# Patient Record
Sex: Female | Born: 1960 | Race: Black or African American | Hispanic: No | Smoking: Former smoker
Health system: Southern US, Community
[De-identification: ages and names within clinical notes are randomized; demographics above are authoritative.]

## PROBLEM LIST (undated history)

## (undated) DIAGNOSIS — J449 Chronic obstructive pulmonary disease, unspecified: Secondary | ICD-10-CM

## (undated) DIAGNOSIS — N200 Calculus of kidney: Secondary | ICD-10-CM

## (undated) DIAGNOSIS — E785 Hyperlipidemia, unspecified: Secondary | ICD-10-CM

## (undated) DIAGNOSIS — R7303 Prediabetes: Secondary | ICD-10-CM

## (undated) DIAGNOSIS — M810 Age-related osteoporosis without current pathological fracture: Secondary | ICD-10-CM

## (undated) DIAGNOSIS — J45909 Unspecified asthma, uncomplicated: Secondary | ICD-10-CM

## (undated) DIAGNOSIS — F319 Bipolar disorder, unspecified: Secondary | ICD-10-CM

## (undated) DIAGNOSIS — T7840XA Allergy, unspecified, initial encounter: Secondary | ICD-10-CM

## (undated) DIAGNOSIS — J309 Allergic rhinitis, unspecified: Secondary | ICD-10-CM

## (undated) DIAGNOSIS — I739 Peripheral vascular disease, unspecified: Secondary | ICD-10-CM

## (undated) DIAGNOSIS — F32A Depression, unspecified: Secondary | ICD-10-CM

## (undated) DIAGNOSIS — M1990 Unspecified osteoarthritis, unspecified site: Secondary | ICD-10-CM

## (undated) DIAGNOSIS — L509 Urticaria, unspecified: Secondary | ICD-10-CM

## (undated) DIAGNOSIS — E559 Vitamin D deficiency, unspecified: Secondary | ICD-10-CM

## (undated) DIAGNOSIS — Z87442 Personal history of urinary calculi: Secondary | ICD-10-CM

## (undated) DIAGNOSIS — I209 Angina pectoris, unspecified: Secondary | ICD-10-CM

## (undated) DIAGNOSIS — R569 Unspecified convulsions: Secondary | ICD-10-CM

## (undated) DIAGNOSIS — J439 Emphysema, unspecified: Secondary | ICD-10-CM

## (undated) DIAGNOSIS — F329 Major depressive disorder, single episode, unspecified: Secondary | ICD-10-CM

## (undated) DIAGNOSIS — K219 Gastro-esophageal reflux disease without esophagitis: Secondary | ICD-10-CM

## (undated) DIAGNOSIS — J189 Pneumonia, unspecified organism: Secondary | ICD-10-CM

## (undated) DIAGNOSIS — F419 Anxiety disorder, unspecified: Secondary | ICD-10-CM

## (undated) DIAGNOSIS — R0600 Dyspnea, unspecified: Secondary | ICD-10-CM

## (undated) DIAGNOSIS — I10 Essential (primary) hypertension: Secondary | ICD-10-CM

## (undated) DIAGNOSIS — G4730 Sleep apnea, unspecified: Secondary | ICD-10-CM

## (undated) DIAGNOSIS — D509 Iron deficiency anemia, unspecified: Secondary | ICD-10-CM

## (undated) DIAGNOSIS — M797 Fibromyalgia: Secondary | ICD-10-CM

## (undated) DIAGNOSIS — R519 Headache, unspecified: Secondary | ICD-10-CM

## (undated) HISTORY — DX: Allergy, unspecified, initial encounter: T78.40XA

## (undated) HISTORY — DX: Age-related osteoporosis without current pathological fracture: M81.0

## (undated) HISTORY — DX: Chronic obstructive pulmonary disease, unspecified: J44.9

## (undated) HISTORY — PX: NECK SURGERY: SHX720

## (undated) HISTORY — DX: Prediabetes: R73.03

## (undated) HISTORY — DX: Unspecified asthma, uncomplicated: J45.909

## (undated) HISTORY — DX: Emphysema, unspecified: J43.9

## (undated) HISTORY — PX: DIAGNOSTIC LAPAROSCOPY: SUR761

## (undated) HISTORY — PX: KIDNEY STONE SURGERY: SHX686

## (undated) HISTORY — PX: INCISE AND DRAIN ABCESS: PRO64

## (undated) HISTORY — DX: Vitamin D deficiency, unspecified: E55.9

## (undated) HISTORY — DX: Calculus of kidney: N20.0

## (undated) HISTORY — PX: TOE SURGERY: SHX1073

## (undated) HISTORY — DX: Urticaria, unspecified: L50.9

## (undated) HISTORY — DX: Fibromyalgia: M79.7

## (undated) HISTORY — DX: Hyperlipidemia, unspecified: E78.5

## (undated) HISTORY — PX: ABDOMINAL HYSTERECTOMY: SHX81

## (undated) HISTORY — DX: Essential (primary) hypertension: I10

## (undated) HISTORY — PX: TUBAL LIGATION: SHX77

## (undated) HISTORY — PX: BACK SURGERY: SHX140

## (undated) HISTORY — DX: Allergic rhinitis, unspecified: J30.9

## (undated) HISTORY — DX: Iron deficiency anemia, unspecified: D50.9

## (undated) HISTORY — DX: Sleep apnea, unspecified: G47.30

## (undated) HISTORY — PX: HEMORRHOID SURGERY: SHX153

## (undated) SURGERY — Surgical Case
Anesthesia: *Unknown

---

## 1998-11-26 ENCOUNTER — Emergency Department (HOSPITAL_COMMUNITY): Admission: EM | Admit: 1998-11-26 | Discharge: 1998-11-26 | Payer: Self-pay | Admitting: Emergency Medicine

## 1999-04-15 ENCOUNTER — Emergency Department (HOSPITAL_COMMUNITY): Admission: EM | Admit: 1999-04-15 | Discharge: 1999-04-15 | Payer: Self-pay | Admitting: Emergency Medicine

## 2000-01-17 ENCOUNTER — Encounter: Admission: RE | Admit: 2000-01-17 | Discharge: 2000-01-17 | Payer: Self-pay | Admitting: Internal Medicine

## 2000-01-17 ENCOUNTER — Encounter: Payer: Self-pay | Admitting: Internal Medicine

## 2000-04-10 ENCOUNTER — Other Ambulatory Visit: Admission: RE | Admit: 2000-04-10 | Discharge: 2000-04-10 | Payer: Self-pay | Admitting: Obstetrics

## 2000-04-26 ENCOUNTER — Encounter: Payer: Self-pay | Admitting: Internal Medicine

## 2000-04-26 ENCOUNTER — Emergency Department (HOSPITAL_COMMUNITY): Admission: EM | Admit: 2000-04-26 | Discharge: 2000-04-26 | Payer: Self-pay | Admitting: Internal Medicine

## 2000-10-12 ENCOUNTER — Emergency Department (HOSPITAL_COMMUNITY): Admission: EM | Admit: 2000-10-12 | Discharge: 2000-10-12 | Payer: Self-pay | Admitting: Emergency Medicine

## 2000-10-12 ENCOUNTER — Encounter: Payer: Self-pay | Admitting: Emergency Medicine

## 2001-03-22 ENCOUNTER — Emergency Department (HOSPITAL_COMMUNITY): Admission: EM | Admit: 2001-03-22 | Discharge: 2001-03-22 | Payer: Self-pay | Admitting: Emergency Medicine

## 2002-05-21 ENCOUNTER — Other Ambulatory Visit: Admission: RE | Admit: 2002-05-21 | Discharge: 2002-05-21 | Payer: Self-pay | Admitting: *Deleted

## 2003-01-20 ENCOUNTER — Ambulatory Visit (HOSPITAL_COMMUNITY): Admission: RE | Admit: 2003-01-20 | Discharge: 2003-01-20 | Payer: Self-pay | Admitting: Family Medicine

## 2003-01-20 ENCOUNTER — Encounter: Payer: Self-pay | Admitting: Family Medicine

## 2003-03-20 ENCOUNTER — Emergency Department (HOSPITAL_COMMUNITY): Admission: EM | Admit: 2003-03-20 | Discharge: 2003-03-20 | Payer: Self-pay | Admitting: Emergency Medicine

## 2003-03-22 ENCOUNTER — Encounter: Payer: Self-pay | Admitting: Emergency Medicine

## 2003-03-22 ENCOUNTER — Ambulatory Visit (HOSPITAL_COMMUNITY): Admission: RE | Admit: 2003-03-22 | Discharge: 2003-03-22 | Payer: Self-pay | Admitting: Emergency Medicine

## 2003-05-28 ENCOUNTER — Encounter (INDEPENDENT_AMBULATORY_CARE_PROVIDER_SITE_OTHER): Payer: Self-pay | Admitting: *Deleted

## 2003-05-28 ENCOUNTER — Inpatient Hospital Stay (HOSPITAL_COMMUNITY): Admission: RE | Admit: 2003-05-28 | Discharge: 2003-05-31 | Payer: Self-pay | Admitting: *Deleted

## 2004-11-13 ENCOUNTER — Ambulatory Visit (HOSPITAL_COMMUNITY): Admission: RE | Admit: 2004-11-13 | Discharge: 2004-11-13 | Payer: Self-pay | Admitting: Internal Medicine

## 2006-04-01 ENCOUNTER — Emergency Department (HOSPITAL_COMMUNITY): Admission: EM | Admit: 2006-04-01 | Discharge: 2006-04-01 | Payer: Self-pay | Admitting: Emergency Medicine

## 2006-04-18 ENCOUNTER — Emergency Department (HOSPITAL_COMMUNITY): Admission: EM | Admit: 2006-04-18 | Discharge: 2006-04-18 | Payer: Self-pay | Admitting: Emergency Medicine

## 2006-08-26 ENCOUNTER — Emergency Department (HOSPITAL_COMMUNITY): Admission: EM | Admit: 2006-08-26 | Discharge: 2006-08-26 | Payer: Self-pay | Admitting: Emergency Medicine

## 2006-08-28 ENCOUNTER — Ambulatory Visit (HOSPITAL_COMMUNITY): Admission: RE | Admit: 2006-08-28 | Discharge: 2006-08-28 | Payer: Self-pay | Admitting: Internal Medicine

## 2006-09-09 ENCOUNTER — Ambulatory Visit (HOSPITAL_COMMUNITY): Admission: RE | Admit: 2006-09-09 | Discharge: 2006-09-10 | Payer: Self-pay | Admitting: Neurosurgery

## 2007-09-25 ENCOUNTER — Ambulatory Visit (HOSPITAL_COMMUNITY): Admission: RE | Admit: 2007-09-25 | Discharge: 2007-09-25 | Payer: Self-pay | Admitting: Internal Medicine

## 2008-08-04 ENCOUNTER — Emergency Department (HOSPITAL_COMMUNITY): Admission: EM | Admit: 2008-08-04 | Discharge: 2008-08-04 | Payer: Self-pay | Admitting: Emergency Medicine

## 2009-03-02 ENCOUNTER — Emergency Department (HOSPITAL_COMMUNITY): Admission: EM | Admit: 2009-03-02 | Discharge: 2009-03-02 | Payer: Self-pay | Admitting: Emergency Medicine

## 2009-04-28 ENCOUNTER — Observation Stay (HOSPITAL_COMMUNITY): Admission: EM | Admit: 2009-04-28 | Discharge: 2009-04-29 | Payer: Self-pay | Admitting: Emergency Medicine

## 2009-05-02 ENCOUNTER — Inpatient Hospital Stay (HOSPITAL_COMMUNITY): Admission: EM | Admit: 2009-05-02 | Discharge: 2009-05-08 | Payer: Self-pay | Admitting: Emergency Medicine

## 2009-05-12 ENCOUNTER — Ambulatory Visit: Payer: Self-pay | Admitting: Emergency Medicine

## 2009-05-12 DIAGNOSIS — J449 Chronic obstructive pulmonary disease, unspecified: Secondary | ICD-10-CM | POA: Insufficient documentation

## 2009-05-12 DIAGNOSIS — I10 Essential (primary) hypertension: Secondary | ICD-10-CM

## 2009-05-12 DIAGNOSIS — J45909 Unspecified asthma, uncomplicated: Secondary | ICD-10-CM | POA: Insufficient documentation

## 2009-05-12 DIAGNOSIS — J309 Allergic rhinitis, unspecified: Secondary | ICD-10-CM

## 2009-05-12 HISTORY — DX: Unspecified asthma, uncomplicated: J45.909

## 2009-05-12 HISTORY — DX: Allergic rhinitis, unspecified: J30.9

## 2009-05-12 HISTORY — DX: Essential (primary) hypertension: I10

## 2009-05-19 ENCOUNTER — Emergency Department (HOSPITAL_COMMUNITY): Admission: EM | Admit: 2009-05-19 | Discharge: 2009-05-19 | Payer: Self-pay | Admitting: Emergency Medicine

## 2009-06-15 ENCOUNTER — Ambulatory Visit: Payer: Self-pay | Admitting: Emergency Medicine

## 2009-07-05 ENCOUNTER — Emergency Department (HOSPITAL_COMMUNITY): Admission: EM | Admit: 2009-07-05 | Discharge: 2009-07-05 | Payer: Self-pay | Admitting: Emergency Medicine

## 2009-07-06 ENCOUNTER — Telehealth (INDEPENDENT_AMBULATORY_CARE_PROVIDER_SITE_OTHER): Payer: Self-pay | Admitting: *Deleted

## 2009-07-25 ENCOUNTER — Telehealth (INDEPENDENT_AMBULATORY_CARE_PROVIDER_SITE_OTHER): Payer: Self-pay | Admitting: *Deleted

## 2009-07-26 ENCOUNTER — Ambulatory Visit: Payer: Self-pay | Admitting: Emergency Medicine

## 2009-08-24 DIAGNOSIS — J441 Chronic obstructive pulmonary disease with (acute) exacerbation: Secondary | ICD-10-CM | POA: Insufficient documentation

## 2009-08-24 DIAGNOSIS — R519 Headache, unspecified: Secondary | ICD-10-CM | POA: Insufficient documentation

## 2009-08-24 DIAGNOSIS — R51 Headache: Secondary | ICD-10-CM | POA: Insufficient documentation

## 2009-08-24 DIAGNOSIS — J449 Chronic obstructive pulmonary disease, unspecified: Secondary | ICD-10-CM

## 2009-08-24 HISTORY — DX: Chronic obstructive pulmonary disease, unspecified: J44.9

## 2009-08-25 ENCOUNTER — Ambulatory Visit: Payer: Self-pay | Admitting: Cardiology

## 2009-08-30 ENCOUNTER — Telehealth (INDEPENDENT_AMBULATORY_CARE_PROVIDER_SITE_OTHER): Payer: Self-pay | Admitting: *Deleted

## 2009-08-31 ENCOUNTER — Ambulatory Visit: Payer: Self-pay

## 2009-08-31 ENCOUNTER — Encounter: Payer: Self-pay | Admitting: Cardiology

## 2009-12-14 ENCOUNTER — Emergency Department (HOSPITAL_COMMUNITY): Admission: EM | Admit: 2009-12-14 | Discharge: 2009-12-14 | Payer: Self-pay | Admitting: Emergency Medicine

## 2009-12-23 ENCOUNTER — Emergency Department (HOSPITAL_COMMUNITY): Admission: EM | Admit: 2009-12-23 | Discharge: 2009-12-23 | Payer: Self-pay | Admitting: Family Medicine

## 2010-05-10 ENCOUNTER — Ambulatory Visit (HOSPITAL_COMMUNITY): Admission: RE | Admit: 2010-05-10 | Discharge: 2010-05-10 | Payer: Self-pay | Admitting: Psychiatry

## 2010-05-14 ENCOUNTER — Emergency Department (HOSPITAL_COMMUNITY): Admission: EM | Admit: 2010-05-14 | Discharge: 2010-05-14 | Payer: Self-pay | Admitting: Emergency Medicine

## 2010-07-12 ENCOUNTER — Ambulatory Visit: Payer: Self-pay | Admitting: Internal Medicine

## 2010-07-12 ENCOUNTER — Encounter (INDEPENDENT_AMBULATORY_CARE_PROVIDER_SITE_OTHER): Payer: Self-pay | Admitting: Family Medicine

## 2010-07-12 LAB — CONVERTED CEMR LAB
ALT: 14 units/L (ref 0–35)
AST: 15 units/L (ref 0–37)
Albumin: 4.4 g/dL (ref 3.5–5.2)
Alkaline Phosphatase: 71 units/L (ref 39–117)
BUN: 9 mg/dL (ref 6–23)
Basophils Absolute: 0 10*3/uL (ref 0.0–0.1)
Basophils Relative: 0 % (ref 0–1)
CO2: 26 meq/L (ref 19–32)
Calcium: 9.4 mg/dL (ref 8.4–10.5)
Chloride: 110 meq/L (ref 96–112)
Cholesterol: 155 mg/dL (ref 0–200)
Creatinine, Ser: 0.76 mg/dL (ref 0.40–1.20)
Eosinophils Absolute: 0.2 10*3/uL (ref 0.0–0.7)
Eosinophils Relative: 2 % (ref 0–5)
Glucose, Bld: 65 mg/dL — ABNORMAL LOW (ref 70–99)
HCT: 36 % (ref 36.0–46.0)
HDL: 53 mg/dL (ref >39)
Hemoglobin: 12.1 g/dL (ref 12.0–15.0)
LDL Cholesterol: 88 mg/dL (ref 0–99)
Lymphocytes Relative: 40 % (ref 12–46)
Lymphs Abs: 4.1 10*3/uL — ABNORMAL HIGH (ref 0.7–4.0)
MCHC: 33.6 g/dL (ref 30.0–36.0)
MCV: 91.4 fL (ref 78.0–100.0)
Monocytes Absolute: 0.7 10*3/uL (ref 0.1–1.0)
Monocytes Relative: 7 % (ref 3–12)
Neutro Abs: 5.2 10*3/uL (ref 1.7–7.7)
Neutrophils Relative %: 51 % (ref 43–77)
Platelets: 301 10*3/uL (ref 150–400)
Potassium: 4.1 meq/L (ref 3.5–5.3)
RBC: 3.94 M/uL (ref 3.87–5.11)
RDW: 13.1 % (ref 11.5–15.5)
Sodium: 145 meq/L (ref 135–145)
TSH: 1.376 microintl units/mL (ref 0.350–4.500)
Total Bilirubin: 0.3 mg/dL (ref 0.3–1.2)
Total CHOL/HDL Ratio: 2.9
Total Protein: 6.8 g/dL (ref 6.0–8.3)
Triglycerides: 68 mg/dL (ref <150)
Uric Acid, Serum: 6.1 mg/dL (ref 2.4–7.0)
VLDL: 14 mg/dL (ref 0–40)
WBC: 10.2 10*3/uL (ref 4.0–10.5)

## 2010-07-20 ENCOUNTER — Ambulatory Visit (HOSPITAL_COMMUNITY): Admission: RE | Admit: 2010-07-20 | Discharge: 2010-07-20 | Payer: Self-pay | Admitting: Family Medicine

## 2010-10-18 ENCOUNTER — Emergency Department (HOSPITAL_COMMUNITY)
Admission: EM | Admit: 2010-10-18 | Discharge: 2010-10-19 | Payer: Self-pay | Source: Home / Self Care | Admitting: Emergency Medicine

## 2010-11-23 ENCOUNTER — Emergency Department (HOSPITAL_COMMUNITY): Admission: EM | Admit: 2010-11-23 | Discharge: 2010-07-06 | Payer: Self-pay | Admitting: Emergency Medicine

## 2011-01-06 ENCOUNTER — Encounter: Payer: Self-pay | Admitting: Internal Medicine

## 2011-01-07 ENCOUNTER — Encounter: Payer: Self-pay | Admitting: Internal Medicine

## 2011-01-16 NOTE — Assessment & Plan Note (Signed)
Summary: Cardiology Nuclear Study  Nuclear Med Background Indications for Stress Test: Evaluation for Ischemia   History: Asthma, COPD  History Comments: No documented CAD  Symptoms: Chest Pain, Chest Tightness, Diaphoresis, DOE, Fatigue, Rapid HR    Nuclear Pre-Procedure Cardiac Risk Factors: Family History - CAD, History of Smoking, Hypertension Caffeine/Decaff Intake: none NPO After: 10:00 PM Lungs: Deminished throughout, but no wheezes.  Albuterol inhaler, 2 puffs, used prior to exercise. IV 0.9% NS with Angio Cath: 20g     IV Site: (L) AC IV Started by: Stanton Kidney EMT-P Chest Size (in) 38     Cup Size DD     Height (in): 63 Weight (lb): 164 BMI: 29.16  Nuclear Med Study 1 or 2 day study:  1 day     Stress Test Type:  Stress Reading MD:  Olga Millers, MD     Referring MD:  Landis Martins, MD Resting Radionuclide:  Technetium 19m Tetrofosmin     Resting Radionuclide Dose:  11 mCi  Stress Radionuclide:  Technetium 42m Tetrofosmin     Stress Radionuclide Dose:  33 mCi   Stress Protocol Exercise Time (min):  7:00 min     Max HR:  148 bpm     Predicted Max HR: 174 bpm  Max Systolic BP: 152 mm Hg     % Max HR:  85 %     METS: 7.8 Rate Pressure Product:  [id number]    Stress Test Technologist:  Rea College CMA-N     Nuclear Technologist:  Domenic Polite CNMT  Rest Procedure  Myocardial perfusion imaging was performed at rest 45 minutes following the intraveneous administration of Myoview Technetium 32m Tetrofosmin.  Stress Procedure  The patient exercised for seven minutes .  The patient stopped due to fatigue and audible wheezing.  She denied any chest pain.  There were no significant ST-T wave changes.  Myoview was injected at peak exercise and myocardial perfusion imaging was performed after a brief delay.  Lungs were assessed post exercise and were still deminished, but no wheezing.  QPS Raw Data Images:  Mild breast attenuation.  Normal left ventricular size. Stress  Images:  There is decreased uptake in the distal anterior wall and apex. Rest Images:  There is decreased uptake in the distal anterior wall and apex. Subtraction (SDS):  No evidence of ischemia. Transient Ischemic Dilatation:  1.15  (Normal <1.22)  Lung/Heart Ratio:  .32  (Normal <0.45)  Quantitative Gated Spect Images QGS EDV:  89 ml QGS ESV:  40 ml QGS EF:  55 % QGS cine images:  Normal wall motion.   Overall Impression  Exercise Capacity: Fair exercise capacity. BP Response: Normal blood pressure response. Clinical Symptoms: There is dyspnea. ECG Impression: No significant ST segment change suggestive of ischemia. Overall Impression: There is probable breast attenuation but  no sign of scar or ischemia.  Appended Document: Cardiology Nuclear Study ok  Appended Document: Cardiology Nuclear Study Left message to call back    Appended Document: Cardiology Nuclear Study pt aware of results

## 2011-01-16 NOTE — Progress Notes (Signed)
Summary: meds  Phone Note Call from Patient Call back at [phone]   Caller: Patient Call For: byrum Reason for Call: Talk to Nurse, Talk to Doctor Complaint: Headache Summary of Call: bp up last night, headache, memory loss, went to [facility]).  Was told she needed to go back on her meds for depression.  Please advise, took prozac and another med for depression. [facility]    Initial call taken by: Eugene Gavia,  July 06, 2009 11:23 AM  Follow-up for Phone Call        Spoke with pt and advised that she discuss this with her PCP. Follow-up by: Vernie Murders,  July 06, 2009 12:21 PM

## 2011-01-16 NOTE — Assessment & Plan Note (Signed)
Summary: asthma, chest discomfort   Visit Type:  Follow-up Copy to:  ER doctor Primary Provider/Referring Provider:  Lucky Cowboy  CC:  Asthma.   Pt c/o increased sob x2 weeks and she is using her nebulizer up to 3-4 times daily.Marland Kitchen  History of Present Illness: 50 yo former smoker with hx HTN, migraine HA, asthma dx in 2000 after a hospitalization for dyspnea. No asthma as a child. Initially placed on as needed SABA. Has received pred bursts every few years. Never  treated with maintanance meds. Was admitted 5/17 - 5/23 for an acute exacerbation - couldn't catch her breath, loud wheezing, no response to inhaler, dry cough, pain in her chest.  Triggers: smoke, grass, hot weather  ROV 06/15/09 -- Returns for f/u and to review PFT. Was started on Advair prior to our last visit. Her throat symptoms seem to be better after loratadine once daily   ROV 07/26/09 -- returns for acute visit, has noticed some chest pain and tightness, L UE pain beginning 3 - 4 days ago, tingling in her arm. Was seen in the ER for an anxiety attack, was not treated for her asthma at that visit. Having more exertional dyspnea, worried about LE edema. This has been a problem since being exposed to more smoke since she is living with her sister. She is fatigued, no energy, has been using nebulized albuterol more frequently. The chest discomfort she is experiencing is a burning, sounds like GERD.   Current Medications (verified): 1)  Mucinex D [phone] Mg Xr12h-Tab (Pseudoephedrine-Guaifenesin) .Marland Kitchen.. 1 Tab Two Times A Day 2)  Albuterol Sulfate (2.5 Mg/74ml) 0.083% Nebu (Albuterol Sulfate) .Marland Kitchen.. 1 Vial Every 6 Hours As Needed For Sob 3)  Cvs Omeprazole 20 Mg Tbec (Omeprazole) .... Once Daily 4)  Advair Diskus 250-50 Mcg/dose Misc (Fluticasone-Salmeterol) .Marland Kitchen.. 1 Puff Two Times A Day 5)  Loradamed 10 Mg Tabs (Loratadine) .Marland Kitchen.. 1 By Mouth Once Daily 6)  Lorazepam 1 Mg Tabs (Lorazepam) .Marland Kitchen.. 1 By Mouth Three Times A Day As Needed   Allergies (verified): 1)  ! Sulfa  Vital Signs:  Patient profile:   50 year old female Height:      63 inches (160.02 cm) Weight:      171.13 pounds (77.79 kg) BMI:     30.42 O2 Sat:      97 % on Room air Temp:     97.9 degrees F (36.61 degrees C) oral Pulse rate:   73 / minute BP sitting:   112 / 80  (left arm) Cuff size:   regular  Vitals Entered By: Michel Bickers CMA (July 26, 2009 9:38 AM)  O2 Flow:  Room air  Physical Exam  General:  well developed, well nourished, in no acute distress Head:  normocephalic and atraumatic Eyes:  conjunctiva and sclera clear Nose:  no deformity, discharge, inflammation, or lesions Mouth:  no deformity or lesions, no thrush Neck:  no masses, thyromegaly, or abnormal cervical nodes, no stridor today Chest Wall:  no deformities noted Lungs:  clear bilaterally to auscultation, very distant  Heart:  regular rate and rhythm, S1, S2 without murmurs, rubs, gallops, or clicks Abdomen:  not examined Msk:  no deformity or scoliosis noted with normal posture Extremities:  no clubbing, cyanosis, edema, or deformity noted Neurologic:  non-focal Skin:  intact without lesions or rashes Psych:  alert and cooperative; normal mood and affect; normal attention span and concentration   Impression & Recommendations:  Problem # 1:  ASTHMA (ICD-493.90) Her control  is a bit worse since moving in with her sister and has been exposed to smoke. Here chest discomfort sounds like GERD, but has been associated with exertion at times, associated with L UE tingling. She has a family hx of CAD and I believe she needs a general cards eval to risk stratify, assess for CAD.  - Advair two times a day  - as needed alb - cards referral for atypical CP - ROV 4 mon  Medications Added to Medication List This Visit: 1)  Lorazepam 1 Mg Tabs (Lorazepam) .Marland Kitchen.. 1 by mouth three times a day as needed  Other Orders: Est. Patient Level IV ([address]109) Cardiology Referral (Cardiology)   Patient Instructions: 1)  Continue your Advair two times a day 2)  Use you nebulized albuterol as needed.  3)  Avoid the smoke exposure if at all possible.  4)  We will send you for a cardiology evaluation to better evaluate your chest discomfort and arm tingling.  5)  Follow up with Dr Delton Coombes in 4 months or as needed.

## 2011-01-16 NOTE — Assessment & Plan Note (Signed)
Summary: asthma   Visit Type:  Follow-up Copy to:  ER doctor Primary Provider/Referring Provider:  Lucky Cowboy  CC:  Pt here to discuss PFT results. Pt c/o increased anxiety due personal issues and pt request medication refill for anxiety meds..  History of Present Illness: 50 yo former smoker with hx HTN, migraine HA, asthma dx in 2000 after a hospitalization for dyspnea. No asthma as a child. Initially placed on as needed SABA. Has received pred bursts every few years. Never  treated with maintanance meds. Was recently admitted 5/17 - 5/23 for an acute exacerbation - couldn't catch her breath, loud wheezing, no response to inhaler, dry cough, pain in her chest. Treated with BD's, methylpred  taper, Avelox. Started Advair two times a day. She is not sure the Advair helps, she does believe the nebs help.   Triggers: smoke, grass, hot weather  ROV 06/15/09 -- Returns for f/u and to review PFT. Was started on Advair prior to our last visit. Her throat symptoms seem to be better after loratadine once daily   Current Medications (verified): 1)  Mucinex D [phone] Mg Xr12h-Tab (Pseudoephedrine-Guaifenesin) .Marland Kitchen.. 1 Tab Two Times A Day 2)  Albuterol Sulfate (2.5 Mg/80ml) 0.083% Nebu (Albuterol Sulfate) .Marland Kitchen.. 1 Vial Every 6 Hours As Needed For Sob 3)  Cvs Omeprazole 20 Mg Tbec (Omeprazole) .... Once Daily 4)  Advair Diskus 250-50 Mcg/dose Misc (Fluticasone-Salmeterol) .Marland Kitchen.. 1 Puff Two Times A Day 5)  Loradamed 10 Mg Tabs (Loratadine) .Marland Kitchen.. 1 By Mouth Once Daily 6)  Fluoxetine Hcl 40 Mg Caps (Fluoxetine Hcl) .... Take 1 Tablet By Mouth Once A Day 7)  Buspirone Hcl 15 Mg Tabs (Buspirone Hcl) .... Take 1 Tablet By Mouth Three Times A Day 8)  Oxazepam 15 Mg Caps (Oxazepam) .... Take 1 Tablet By Mouth Two Times A Day  Allergies (verified): 1)  ! Sulfa  Vital Signs:  Patient profile:   50 year old female Height:      63 inches Weight:      166 pounds O2 Sat:      98 % on Room air Temp:     97.9  degrees F oral Pulse rate:   74 / minute BP sitting:   142 / 90  (right arm) Cuff size:   regular  Vitals Entered By: Carron Curie CMA (June 15, 2009 4:02 PM)  O2 Flow:  Room air CC: Pt here to discuss PFT results. Pt c/o increased anxiety due personal issues, pt request medication refill for anxiety meds. Comments Medications reviewed with patient Carron Curie CMA  June 15, 2009 4:02 PM    Physical Exam  General:  well developed, well nourished, in no acute distress Head:  normocephalic and atraumatic Eyes:  conjunctiva and sclera clear Nose:  no deformity, discharge, inflammation, or lesions Mouth:  no deformity or lesions, no thrush Neck:  no masses, thyromegaly, or abnormal cervical nodes, no stridor today Chest Wall:  no deformities noted Lungs:  clear bilaterally to auscultation, very distant  Heart:  regular rate and rhythm, S1, S2 without murmurs, rubs, gallops, or clicks Abdomen:  not examined Msk:  no deformity or scoliosis noted with normal posture Extremities:  no clubbing, cyanosis, edema, or deformity noted Neurologic:  non-focal Skin:  intact without lesions or rashes Psych:  alert and cooperative; normal mood and affect; normal attention span and concentration   Pulmonary Function Test Date: 06/15/2009 Height (in.): 63 Gender: Female  Pre-Spirometry FVC    Value: 2.01 L/min  Pred: 3.29 L/min     % Pred: 61 % FEV1    Value: 0.87 L     Pred: 2.52 L     % Pred: 34 % FEV1/FVC  Value: 43 %     Pred: 76 %     % Pred: - % FEF 25-75  Value: 0.32 L/min   Pred: 2.95 L/min     % Pred: 11 %  Post-Spirometry FVC    Value: 2.33 L/min   Pred: 3.29 L/min     % Pred: 71 % FEV1    Value: 1.11 L     Pred: 2.52 L     % Pred: 44 % FEV1/FVC  Value: 48 %     Pred: 76 %     % Pred: - % FEF 25-75  Value: 0.34 L/min   Pred: 2.95 L/min     % Pred: 12 %  Lung Volumes TLC    Value: 4.46 L   % Pred: 96 % RV    Value: 2.45 L   % Pred: 150 % DLCO    Value: 10.9 %   %  Pred: 48 % DLCO/VA  Value: 3.58 %   % Pred: 88 %  Comments: Severe AFL with positive BD response. Hyperinflated volumes. Decreased diffusion that corrects when adjusted for alveolar volume. RSB  Impression & Recommendations:  Problem # 1:  ASTHMA (ICD-493.90) Continue Advair two times a day ProAir as needed.  Follow up 6 months or as needed.   Problem # 2:  ALLERGIC RHINITIS (ICD-477.9)  Her updated medication list for this problem includes:    Loradamed 10 Mg Tabs (Loratadine) .Marland Kitchen... 1 by mouth once daily  Orders: Est. Patient Level III (16109)  Medications Added to Medication List This Visit: 1)  Fluoxetine Hcl 40 Mg Caps (Fluoxetine hcl) .... Take 1 tablet by mouth once a day 2)  Buspirone Hcl 15 Mg Tabs (Buspirone hcl) .... Take 1 tablet by mouth three times a day 3)  Oxazepam 15 Mg Caps (Oxazepam) .... Take 1 tablet by mouth two times a day  Patient Instructions: 1)  Advair two times a day 2)  Start ProAir 2 puffs up to every 4 hours if needed for shortness of breath 3)  Continue loratadine 10mg  once daily  4)  Follow up with Dr Delton Coombes in 6 months or as needed.

## 2011-01-16 NOTE — Assessment & Plan Note (Signed)
Summary: asthma   Visit Type:  Initial Consult Copy to:  ER doctor Primary Provider/Referring Provider:  Lucky Cowboy  CC:  pulmonary consult....was in hosp....Marland KitchenCOPD, asthma..hypertension, and wheezing.  History of Present Illness: 50 yo former smoker with hx HTN, migraine HA, asthma dx in 2000 after a hospitalization for dyspnea. No asthma as a child. Initially placed on as needed SABA. Has received pred bursts every few years. Never  treated with maintanance meds. Was recently admitted 5/17 - 5/23 for an acute exacerbation - couldn't catch her breath, loud wheezing, no response to inhaler, dry cough, pain in her chest. Treated with BD's, methylpred  taper, Avelox. Started Advair two times a day. She is not sure the Advair helps, she does believe the nebs help.   Triggers: smoke, grass, hot weather  Asthma History    Initial Asthma Severity Rating:    Age range: 12+ years    Symptoms: >2 days/week; not daily    Nighttime Awakenings: >1/week but not nightly    Interferes w/ normal activity: no limitations    SABA use (not for EIB): 0-2 days/week    Exacerbations requiring oral systemic steroids: 0-1/year    Asthma Severity Assessment: Moderate Persistent   Habits & Providers  Alcohol-Tobacco-Diet     Alcohol drinks/day: 0     Tobacco Status: quit     Cigarette Packs/Day: 25 yrs 1 pk a day   Past History:  Past Medical History: Allergic Rhinitis Asthma Hypertension Migraine headaches kidney stones dysmenorrhea, menorrhagia  Past Surgical History: neck surgery 2007  Family History: allergies-mother and brother asthma- brother and mother cancer- father prostate  Social History: Patient states former smoker. 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  The patient denies productive cough, coughing up blood, irregular heartbeats, acid heartburn, indigestion, loss of appetite, weight change, abdominal pain, difficulty swallowing, sore throat, tooth/dental problems, rash, change in color of mucus, and fever.    Vital Signs:  Patient profile:   50 year old female Height:      63 inches Weight:      164.50 pounds BMI:     29.25 O2 Sat:      96 % on Room air Temp:     98.1 degrees F oral Pulse rate:   80 / minute BP sitting:   110 / 80  (left arm) Cuff size:   regular  Vitals Entered By: Clarise Cruz Duncan Dull) (May 12, 2009 9:31 AM)  O2 Flow:  Room air  Physical Exam  General:  well developed, well nourished, in no acute distress Head:  normocephalic and atraumatic Eyes:  conjunctiva and sclera clear Nose:  no deformity, discharge, inflammation, or lesions Mouth:  no deformity or lesions, no thrush Neck:  no masses, thyromegaly, or abnormal cervical nodes, some mild exp stridor Chest Wall:  no deformities noted Lungs:  clear biulateralkly, referred UA noise Heart:  regular rate and rhythm, S1, S2 without murmurs, rubs, gallops, or clicks Abdomen:  not examined Msk:  no deformity or scoliosis noted with normal posture  Extremities:  no clubbing, cyanosis, edema, or deformity noted Neurologic:  non-focal Skin:  intact without lesions or rashes Psych:  alert and cooperative; normal mood and affect; normal attention span and concentration   Impression & Recommendations:  Problem # 1:  ASTHMA (ICD-493.90) With almost certain component of VCD based on exam today.  - continue Advair for now - full PFT - consider speech referral - f/u next avail  Problem # 2:   ALLERGIC RHINITIS (ICD-477.9)  - start loratadine Her updated medication list for this problem includes:    Loradamed 10 Mg Tabs (Loratadine) .Marland Kitchen... 1 by mouth once daily  Orders: Consultation Level IV (32440)  Medications Added to Medication List This Visit: 1)  Mucinex D [phone] Mg Xr12h-tab (Pseudoephedrine-guaifenesin) .Marland Kitchen.. 1 tab two times a day 2)  Albuterol Sulfate (2.5 Mg/65ml) 0.083% Nebu (Albuterol sulfate) .Marland Kitchen.. 1 vial every 6 hours as needed for sob 3)  Avelox 400 Mg Tabs (Moxifloxacin hcl) .Marland Kitchen.. 1 tab for 5 days 4)  Cvs Omeprazole 20 Mg Tbec (Omeprazole) .... Once daily 5)  Advair Diskus 250-50 Mcg/dose Misc (Fluticasone-salmeterol) .Marland Kitchen.. 1 puff two times a day 6)  Methylprednisolone 4 Mg Tabs (Methylprednisolone) .... Take as directed 7)  Loradamed 10 Mg Tabs (Loratadine) .Marland Kitchen.. 1 by mouth once daily  Patient Instructions: 1)  Continue Advair two times a day for now 2)  Start loratadine 10mg  by mouth once daily  3)  Use your albuterol 2 puffs as needed  4)  Full pulmonary function testing at the time of your next appointment 5)  Follow up next available. Prescriptions: LORADAMED 10 MG TABS (LORATADINE) 1 by mouth once daily  #30 x 5   Entered and Authorized by:   Leslye Peer MD   Signed by:   Leslye Peer MD on 05/12/2009   Method used:   Electronically to        [facility] (retail)       [address]       [place], [place]  [address]       Ph: [phone]       Fax: [phone]   RxID:   [id number]

## 2011-02-27 LAB — POCT I-STAT, CHEM 8
BUN: 9 mg/dL (ref 6–23)
Calcium, Ion: 1.11 mmol/L — ABNORMAL LOW (ref 1.12–1.32)
Chloride: 103 meq/L (ref 96–112)
Creatinine, Ser: 0.7 mg/dL (ref 0.4–1.2)
Glucose, Bld: 88 mg/dL (ref 70–99)
HCT: 43 % (ref 36.0–46.0)
Hemoglobin: 14.6 g/dL (ref 12.0–15.0)
Potassium: 3.8 meq/L (ref 3.5–5.1)
Sodium: 139 meq/L (ref 135–145)
TCO2: 27 mmol/L (ref 0–100)

## 2011-03-03 LAB — DIFFERENTIAL
Basophils Absolute: 0.1 10*3/uL (ref 0.0–0.1)
Basophils Relative: 1 % (ref 0–1)
Eosinophils Absolute: 0.1 10*3/uL (ref 0.0–0.7)
Eosinophils Relative: 1 % (ref 0–5)
Lymphocytes Relative: 38 % (ref 12–46)
Lymphs Abs: 4 10*3/uL (ref 0.7–4.0)
Monocytes Absolute: 0.7 10*3/uL (ref 0.1–1.0)
Monocytes Relative: 6 % (ref 3–12)
Neutro Abs: 5.5 10*3/uL (ref 1.7–7.7)
Neutrophils Relative %: 53 % (ref 43–77)

## 2011-03-03 LAB — BASIC METABOLIC PANEL
BUN: 23 mg/dL (ref 6–23)
CO2: 29 mEq/L (ref 19–32)
Calcium: 9.5 mg/dL (ref 8.4–10.5)
Chloride: 104 mEq/L (ref 96–112)
Creatinine, Ser: 0.94 mg/dL (ref 0.4–1.2)
GFR calc Af Amer: >60 mL/min (ref >60)
GFR calc non Af Amer: >60 mL/min (ref >60)
Glucose, Bld: 102 mg/dL — ABNORMAL HIGH (ref 70–99)
Potassium: 3.5 mEq/L (ref 3.5–5.1)
Sodium: 139 mEq/L (ref 135–145)

## 2011-03-03 LAB — CBC
HCT: 39.5 % (ref 36.0–46.0)
Hemoglobin: 13.4 g/dL (ref 12.0–15.0)
MCH: 31.4 pg (ref 26.0–34.0)
MCHC: 33.8 g/dL (ref 30.0–36.0)
MCV: 92.7 fL (ref 78.0–100.0)
Platelets: 279 10*3/uL (ref 150–400)
RBC: 4.27 MIL/uL (ref 3.87–5.11)
RDW: 13.7 % (ref 11.5–15.5)
WBC: 10.4 10*3/uL (ref 4.0–10.5)

## 2011-03-03 LAB — POCT CARDIAC MARKERS
CKMB, poc: <1 ng/mL — ABNORMAL LOW (ref 1.0–8.0)
Myoglobin, poc: 58 ng/mL (ref 12–200)
Troponin i, poc: <0.05 ng/mL (ref 0.00–0.09)

## 2011-03-27 LAB — CBC
HCT: 33.2 % — ABNORMAL LOW (ref 36.0–46.0)
HCT: 36.6 % (ref 36.0–46.0)
HCT: 37.2 % (ref 36.0–46.0)
HCT: 37.6 % (ref 36.0–46.0)
HCT: 38.7 % (ref 36.0–46.0)
Hemoglobin: 11.4 g/dL — ABNORMAL LOW (ref 12.0–15.0)
Hemoglobin: 12.3 g/dL (ref 12.0–15.0)
Hemoglobin: 12.8 g/dL (ref 12.0–15.0)
Hemoglobin: 12.9 g/dL (ref 12.0–15.0)
Hemoglobin: 13.4 g/dL (ref 12.0–15.0)
MCHC: 33.7 g/dL (ref 30.0–36.0)
MCHC: 34 g/dL (ref 30.0–36.0)
MCHC: 34.3 g/dL (ref 30.0–36.0)
MCHC: 34.5 g/dL (ref 30.0–36.0)
MCHC: 34.7 g/dL (ref 30.0–36.0)
MCV: 92.8 fL (ref 78.0–100.0)
MCV: 93 fL (ref 78.0–100.0)
MCV: 93.1 fL (ref 78.0–100.0)
MCV: 93.2 fL (ref 78.0–100.0)
MCV: 93.3 fL (ref 78.0–100.0)
Platelets: 288 10*3/uL (ref 150–400)
Platelets: 296 10*3/uL (ref 150–400)
Platelets: 389 10*3/uL (ref 150–400)
Platelets: 389 10*3/uL (ref 150–400)
Platelets: 426 10*3/uL — ABNORMAL HIGH (ref 150–400)
RBC: 3.56 MIL/uL — ABNORMAL LOW (ref 3.87–5.11)
RBC: 3.93 MIL/uL (ref 3.87–5.11)
RBC: 4.01 MIL/uL (ref 3.87–5.11)
RBC: 4.05 MIL/uL (ref 3.87–5.11)
RBC: 4.15 MIL/uL (ref 3.87–5.11)
RDW: 13.5 % (ref 11.5–15.5)
RDW: 13.6 % (ref 11.5–15.5)
RDW: 13.6 % (ref 11.5–15.5)
RDW: 13.7 % (ref 11.5–15.5)
RDW: 13.7 % (ref 11.5–15.5)
WBC: 18.8 10*3/uL — ABNORMAL HIGH (ref 4.0–10.5)
WBC: 20.4 10*3/uL — ABNORMAL HIGH (ref 4.0–10.5)
WBC: 24.6 10*3/uL — ABNORMAL HIGH (ref 4.0–10.5)
WBC: 25.1 10*3/uL — ABNORMAL HIGH (ref 4.0–10.5)
WBC: 37.7 10*3/uL — ABNORMAL HIGH (ref 4.0–10.5)

## 2011-03-27 LAB — COMPREHENSIVE METABOLIC PANEL
ALT: 47 U/L — ABNORMAL HIGH (ref 0–35)
ALT: 51 U/L — ABNORMAL HIGH (ref 0–35)
AST: 17 U/L (ref 0–37)
AST: 31 U/L (ref 0–37)
Albumin: 3.1 g/dL — ABNORMAL LOW (ref 3.5–5.2)
Albumin: 3.2 g/dL — ABNORMAL LOW (ref 3.5–5.2)
Alkaline Phosphatase: 72 U/L (ref 39–117)
Alkaline Phosphatase: 75 U/L (ref 39–117)
BUN: 12 mg/dL (ref 6–23)
BUN: 13 mg/dL (ref 6–23)
CO2: 31 mEq/L (ref 19–32)
CO2: 33 mEq/L — ABNORMAL HIGH (ref 19–32)
Calcium: 8.5 mg/dL (ref 8.4–10.5)
Calcium: 9 mg/dL (ref 8.4–10.5)
Chloride: 97 mEq/L (ref 96–112)
Chloride: 99 mEq/L (ref 96–112)
Creatinine, Ser: 0.68 mg/dL (ref 0.4–1.2)
Creatinine, Ser: 0.71 mg/dL (ref 0.4–1.2)
GFR calc Af Amer: >60 mL/min (ref >60)
GFR calc Af Amer: >60 mL/min (ref >60)
GFR calc non Af Amer: >60 mL/min (ref >60)
GFR calc non Af Amer: >60 mL/min (ref >60)
Glucose, Bld: 124 mg/dL — ABNORMAL HIGH (ref 70–99)
Glucose, Bld: 92 mg/dL (ref 70–99)
Potassium: 3.2 mEq/L — ABNORMAL LOW (ref 3.5–5.1)
Potassium: 3.8 mEq/L (ref 3.5–5.1)
Sodium: 136 mEq/L (ref 135–145)
Sodium: 139 mEq/L (ref 135–145)
Total Bilirubin: 0.3 mg/dL (ref 0.3–1.2)
Total Bilirubin: 0.4 mg/dL (ref 0.3–1.2)
Total Protein: 6 g/dL (ref 6.0–8.3)
Total Protein: 6.3 g/dL (ref 6.0–8.3)

## 2011-03-27 LAB — BASIC METABOLIC PANEL
BUN: 7 mg/dL (ref 6–23)
CO2: 29 mEq/L (ref 19–32)
Calcium: 9.6 mg/dL (ref 8.4–10.5)
Chloride: 106 mEq/L (ref 96–112)
Creatinine, Ser: 0.6 mg/dL (ref 0.4–1.2)
GFR calc Af Amer: >60 mL/min (ref >60)
GFR calc non Af Amer: >60 mL/min (ref >60)
Glucose, Bld: 123 mg/dL — ABNORMAL HIGH (ref 70–99)
Potassium: 3.7 mEq/L (ref 3.5–5.1)
Sodium: 141 mEq/L (ref 135–145)

## 2011-03-27 LAB — POCT I-STAT 3, ART BLOOD GAS (G3+)
Bicarbonate: 23.7 mEq/L (ref 20.0–24.0)
O2 Saturation: 88 %
Patient temperature: 37
TCO2: 25 mmol/L (ref 0–100)
pCO2 arterial: 34.1 mmHg — ABNORMAL LOW (ref 35.0–45.0)
pH, Arterial: 7.45 — ABNORMAL HIGH (ref 7.350–7.400)
pO2, Arterial: 52 mmHg — ABNORMAL LOW (ref 80.0–100.0)

## 2011-03-27 LAB — DIFFERENTIAL
Basophils Absolute: 0 10*3/uL (ref 0.0–0.1)
Basophils Absolute: 0 10*3/uL (ref 0.0–0.1)
Basophils Relative: 0 % (ref 0–1)
Basophils Relative: 0 % (ref 0–1)
Eosinophils Absolute: 0 10*3/uL (ref 0.0–0.7)
Eosinophils Absolute: 0 10*3/uL (ref 0.0–0.7)
Eosinophils Relative: 0 % (ref 0–5)
Eosinophils Relative: 0 % (ref 0–5)
Lymphocytes Relative: 14 % (ref 12–46)
Lymphocytes Relative: 9 % — ABNORMAL LOW (ref 12–46)
Lymphs Abs: 2.6 10*3/uL (ref 0.7–4.0)
Lymphs Abs: 3.4 10*3/uL (ref 0.7–4.0)
Monocytes Absolute: 1.1 10*3/uL — ABNORMAL HIGH (ref 0.1–1.0)
Monocytes Absolute: 2.3 10*3/uL — ABNORMAL HIGH (ref 0.1–1.0)
Monocytes Relative: 6 % (ref 3–12)
Monocytes Relative: 6 % (ref 3–12)
Neutro Abs: 15 10*3/uL — ABNORMAL HIGH (ref 1.7–7.7)
Neutro Abs: 32 10*3/uL — ABNORMAL HIGH (ref 1.7–7.7)
Neutrophils Relative %: 80 % — ABNORMAL HIGH (ref 43–77)
Neutrophils Relative %: 85 % — ABNORMAL HIGH (ref 43–77)

## 2011-03-27 LAB — POCT CARDIAC MARKERS
CKMB, poc: <1 ng/mL — ABNORMAL LOW (ref 1.0–8.0)
Myoglobin, poc: 40.4 ng/mL (ref 12–200)
Troponin i, poc: <0.05 ng/mL (ref 0.00–0.09)

## 2011-03-27 LAB — CARDIAC PANEL(CRET KIN+CKTOT+MB+TROPI)
CK, MB: 1.5 ng/mL (ref 0.3–4.0)
Relative Index: INVALID (ref 0.0–2.5)
Total CK: 64 U/L (ref 7–177)
Troponin I: 0.01 ng/mL (ref 0.00–0.06)

## 2011-03-27 LAB — BRAIN NATRIURETIC PEPTIDE: Pro B Natriuretic peptide (BNP): 68 pg/mL (ref 0.0–100.0)

## 2011-05-01 NOTE — H&P (Signed)
NAMEDAMIYA, Felicia NO.:  [id number]   MEDICAL RECORD NO.:  [id number]          PATIENT TYPE:  INP   LOCATION:  5121                         FACILITY:  [facility]   PHYSICIAN:  Zannie Cove, MD     DATE OF BIRTH:  20-May-1961   DATE OF ADMISSION:  05/02/2009  DATE OF DISCHARGE:                              HISTORY & PHYSICAL   PRIMARY CARE PHYSICIAN:  Lucky Cowboy, M.D.   CHIEF COMPLAINT:  Asthma attack.   HISTORY OF PRESENT ILLNESS:  Ms. Lonigro is a 50 year old female  recently discharged 3 days ago after treatment for asthma exacerbation,  on prednisone taper not albuterol, presents with worsening of dyspnea  and wheezing, unrelieved with nebulizers and steroids.  No history of  chest pain.  No fevers, chills.  Reports cleaning mould in her house.  The patient has never had flu shot or pneumonia vaccines.  She denies  any sick contact, however, reports rhinorrhea for the past 1 week.  She  denies history of frequent hospitalizations and no history of  intubations for asthma.   PAST MEDICAL HISTORY:  1. Significant for asthma.  2. Hypertension.  3. Migraine.   SURGICAL HISTORY:  1. Significant for C-spine effusion.  2. Hysterectomy.   ALLERGIES:  SULFA.   SOCIAL HISTORY:  Lives at home with her kids and children and  grandchildren, is a former smoker, quit 2 months ago.  Denies alcohol  and drug abuse.   FAMILY HISTORY:  Significant for asthma as well.   REVIEW OF SYSTEMS:  Twelve systems reviewed, negative except for HPI.   PHYSICAL EXAMINATION:  VITAL SIGNS:  She is afebrile.  Pulse is 86,  blood pressure 155/90, respirations 24, O2 sats 95% on room air.  GENERAL:  She is alert, awake, and oriented x3, in mild distress.  No  accessory muscle use.  HEENT:  No JVD.  No accessory muscle use.  CARDIOVASCULAR:  S1, S2.  Regular rate and rhythm.  No murmurs, rubs or  gallops.  LUNGS:  Bilateral expiratory wheezes.  ABDOMEN:  Soft, nontender.   Positive bowel sounds.  EXTREMITIES:  No edema, clubbing, cyanosis.   LABORATORY WORK:  White count of 18.8, hemoglobin 12.8, platelets 296.  Sodium 141, potassium 3.7, chloride 106, bicarb 29, BUN 7, creatinine  0.6, glucose is 123, CK-MB less than 1, troponin less than 0.05, BNP is  68.  EKG normal sinus rhythm with T-wave inversion, V1-V3 unchanged from  prior.  Chest x-ray, no acute changes.   ASSESSMENT AND PLAN:  1. Asthma exacerbation.  DuoNebs q.4 h. and q.2 p.r.n., Solu-Medrol 80      mg IV q.8 h.  Oxygen to keep sats greater than 90%, monitor peak      flows and O2 sats with ambulation.  Start Advair 250/50 one puff      b.i.d.  Needs primary care doctor and good followup and pneumo      vaccine prior to discharge.  2. Hypertension.  We will monitor for now.  If it remains elevated, we      will start hydrochlorothiazide.  3. Deep venous thrombosis prophylaxis.  Lovenox 40 mg subcu daily.  4. Code status.  She is a full code.      Zannie Cove, MD  Electronically Signed     PJ/MEDQ  D:  05/03/2009  T:  05/04/2009  Job:  [phone]

## 2011-05-01 NOTE — Discharge Summary (Signed)
NAMELEONARDO, [name] NO.:  [id number]   MEDICAL RECORD NO.:  [id number]          PATIENT TYPE:  INP   LOCATION:  5121                         FACILITY:  [facility]   PHYSICIAN:  Ruthy Dick, MD    DATE OF BIRTH:  1961-03-17   DATE OF ADMISSION:  05/02/2009  DATE OF DISCHARGE:  05/08/2009                               DISCHARGE SUMMARY   REASON FOR ADMISSION:  Asthma attack.   FINAL DISCHARGE DIAGNOSES:  1. Asthma exacerbation.  2. Presumed chronic obstructive pulmonary disease exacerbation.  3. Asthmatic bronchitis.  4. Migraine headaches.  5. Hypertension, not on medications.  6. Steroid-induced leukocytosis.  7. Tobacco abuse.   CONSULT DURING THIS ADMISSION:  None.   PROCEDURES DONE DURING THIS ADMISSION:  None.   BRIEF HISTORY OF PRESENT ILLNESS AND HOSPITAL COURSE:  This is a 50-year-  old African American lady, with a past medical history significant for  asthma and tobacco abuse, who came with worsening shortness of breath  and was diagnosed as asthma exacerbation.  Considering a long history of  using tobacco, at present she also has some form of COPD exacerbation.  She was treated with standard therapy including steroids, antibiotics,  and nebulizations.  She did gradually well on this regimen and it took  close to 6 days for her to actually turn around well.  In any case, she  did well eventually and we think she is stable enough to go home today.   We recommend that she goes home on the following medications:  1. Albuterol inhaler q.6 h. p.r.n.  2. Albuterol nebulizer q.6 h. p.r.n. for shortness of breath and      wheezing.  3. Medrol Dosepak #1 per instructions.  4. Avelox 400 mg p.o. daily for 5 days.  5. Mucinex 1200 mg p.o. b.i.d. for 5 days.  6. Prilosec 20 mg p.o. daily.  7. Advair 250/50 one puff b.i.d.   She is to also go home with a nebulizer machine, and she has been  advised to quit tobacco use and abuse.  We recommend that her  new  primary care physician schedule her for pulmonary function tests with  [facility] or with any other [facility] he feels like.  The  patient is to follow with the new PCP which Social Service would help  the patient choose.   Time used for discharge planning is greater than 30 minutes.  Today, the  patient says she is feeling much better and no more shortness of breath.  No nausea, no vomiting, no chest pain, no palpitations, no dysuria, no  frequency, no urgency, and no abdominal pain.   PHYSICAL EXAMINATION:  VITAL SIGNS:  Temperature 98.4, pulse 88,  respirations 17, blood pressure 108/68, saturating 95% on room air.  CHEST:  Mostly clear to auscultation bilaterally with only very minimal  expiratory wheezes.  No rhonchi, no rales.  ABDOMEN:  Soft, nontender.  EXTREMITIES:  No clubbing, no cyanosis, no edema.  CARDIOVASCULAR:  First and second heart sounds only.  CENTRAL NERVOUS:  Nonfocal.  SKIN:  No rash.   The patient has been advised to return  to the emergency room for  recurrence or worsening of symptoms.      Ruthy Dick, MD  Electronically Signed     GU/MEDQ  D:  05/08/2009  T:  05/09/2009  Job:  [id number]

## 2011-05-04 NOTE — Discharge Summary (Signed)
   NAME:  [name], [name]                    ACCOUNT NO.:  [id number]   MEDICAL RECORD NO.:  [id number]                   PATIENT TYPE:  INP   LOCATION:  9313                                 FACILITY:  [facility]   PHYSICIAN:  Georgina Peer, M.D.              DATE OF BIRTH:  11-28-1961   DATE OF ADMISSION:  05/28/2003  DATE OF DISCHARGE:  05/31/2003                                 DISCHARGE SUMMARY   ADMISSION DIAGNOSES:  1. Fibroid uterus.  2. Menorrhagia.  3. Dysmenorrhea.   DISCHARGE DIAGNOSES:  1. Fibroid uterus.  2. Menorrhagia.  3. Dysmenorrhea.  4. Compensated anemia due to surgical blood loss.   BRIEF HISTORY:  A 50 year old gravida 6, para 3 with a history of heavy  periods and a fibroid uterus presented for abdominal hysterectomy with  ovarian conservation.   HOSPITAL COURSE:  The patient underwent a TAH under general anesthesia with  a blood loss of 200 mL and the complication of a space of Retzius hematoma  which was drained with a Jackson-Pratt drain.  Postoperatively the patient  did well.  She ambulated without difficulty.  The Jackson-Pratt drain was  draining 80 mL the first day and 15 mL by noon on the second day and was  then removed.  The Foley catheter was removed with adequate output and she  was voiding without difficulty.  She passed flatus on the second day and was  advanced to a regular diet.  Her incision was a subcuticular suture was  clean, dry, and intact.  An ON-Q postoperative catheter system was placed.  This was removed by the third day.  She had no calf tenderness.  Laboratory  values showed initial hemoglobin of 12.8, on the first day 10.8, on the  second day 8.6 and on the third day hemoglobin was 8.6.  WBC on the first  day was 24.9, on the second day 13.6, on the third day was 12.4.  Platelet  count was 241,000.  The patient's examination was normal.  She was taking  Percocet and Motrin for pain.  She was placed on Niferex 150 b.i.d. to  go  home with.  She was given written and verbal discharge instructions.  She  will follow up in the office in two weeks.                                               Georgina Peer, M.D.    JPN/MEDQ  D:  05/31/2003  T:  05/31/2003  Job:  [id number]

## 2011-05-04 NOTE — Op Note (Signed)
[name], [name] NO.:  [id number]   MEDICAL RECORD NO.:  [id number]          PATIENT TYPE:  OIB   LOCATION:  3009                         FACILITY:  [facility]   PHYSICIAN:  Cristi Loron, M.D.DATE OF BIRTH:  1961-04-18   DATE OF PROCEDURE:  09/09/2006  DATE OF DISCHARGE:                                 OPERATIVE REPORT   BRIEF HISTORY:  The patient is a 50 year old black female who has suffered  from neck and right arm pain consistent with a right C6 radiculopathy.  She  failed medical management and was worked up with a cervical MRI which  demonstrated herniated disk at C5-6 on the right.  I discussed the various  treatment with the patient including surgery.  The patient has weighed the  risks, benefits and alternatives of surgery and has decided to proceed with  a C5-6 anterior cervical diskectomy, fusion, and plating.   PREOPERATIVE DIAGNOSIS:  C5-6 herniated nucleus pulposus, spinal stenosis,  cervical radiculopathy, cervicalgia.   POSTOPERATIVE DIAGNOSIS:  C5-6 herniated nucleus pulposus, spinal stenosis,  cervical radiculopathy, cervicalgia.   PROCEDURE:  C5-6 expansive anterior cervical diskectomy/decompression;  insertion of C5-6 interbody prosthesis (Alphatec small 6-mm PEEK  prosthesis); C5-6 interbody arthrodesis with local morselized autograft bone  and Alphatec putty; C5-6 anterior cervical instrumentation (with Codman  SlimLoc titanium plate and screws.   SURGEON:  Cristi Loron, M.D.   ASSISTANT:  Hilda Lias, M.D.   ANESTHESIA:  General endotracheal.   ESTIMATED BLOOD LOSS:  50 cc.   SPECIMENS:  None.   DRAINS:  None.   COMPLICATIONS:  None.   PROCEDURE:  The patient was brought to the operating room by the anesthesia  team, and general endotracheal anesthesia was induced.  The patient remained  in the supine position.  A roll was placed under her shoulders to place her  neck in slight extension.  Her anterior  cervical region was then prepared  with Betadine scrub and Betadine solution.  Sterile drapes were applied.   I then injected the area to be incised with Marcaine with epinephrine  solution and used the scalpel to make a transverse incision in the patient's  left anterior neck.  I used the Metzenbaum scissors to divide the platysma  muscle and to carefully dissect the medial to sternocleidomastoid muscle,  jugular vein, and carotid artery.  I carefully dissected down towards the  anterior cervical spine, identifying the esophagus and retracting it  medially.  I then used Kitner swabs to clear the soft tissue from the  anterior cervical spine and then inserted a bent spinal needle into the  upper exposed intervertebral disk space.  We obtained intraoperative  radiograph to confirm our location.   I then used electrocautery to detach the medial border of the longus colli  muscle bilaterally from the C5-6 intervertebral space.  I then inserted a  Caspar self-retaining retractor underneath the longus colli muscle  bilaterally at C5-6 to provide exposure.  I incised the C5-6 intervertebral  disk with a 15-blade scalpel and performed a partial diskectomy using the  pituitary forceps.  I then inserted the distraction screws at  C5-C6.  I  distracted the interspace and then used the high-speed drill to decorticate  the vertebral endplates at C5-6, drilling away the remainder of the C5-6  intervertebral disk, drilled away some posterior spondylosis, and to thin  out the posterior longitudinal ligament.  I then incised the ligament with  arachnoid knife and removed it with the Kerrison punch, undercutting the  intervertebral endplates and decompressing the thecal sac.  I then performed  a foraminotomy about the bilateral C6 nerve root, completing the  decompression at this level.  Of note, there was a large herniated disk at  C5-6 on left compressing the left C6 nerve root.   Having completed the  decompression, we now turned our attention to  arthrodesis.  I filled a 6-mm small Alphatec peak interbody spacer with a  combination of local morselized bone that we obtained during decompression  as well as a Alphatec putty.  I then inserted the prosthesis into the  distracted C5-6 interspace.  I then removed the distraction screws, and  there was good snug fit of the prosthesis in the interspace.   We now turned our attention to anterior spine instrumentation.  I used the  high-speed drill to remove some ventral spondylosis from the vertebral  endplates at C5-6 so the plate would lay down flat.  I selected the  appropriate length Codman SlimLoc anterior cervical plate and laid it along  the anterior aspect of the C5-6 vertebral bodies.  I drilled two 12-mm holes  at C5, two at C6 and then secured the plate to the vertebral bodies by  placing two 12-mm self-tapping screws at C5, two at C6.  We then obtained  intraoperative radiograph that demonstrated good position of the plate,  screws, and body prosthesis.  I therefore secured the screws to the plate by  locking each cam.   I then irrigated the wound out with bacitracin solution and obtained  hemostasis using bipolar cautery electrocautery.  I removed the Caspar self-  retaining retractor.  I inspected the esophagus for damage, and none was  apparent.  We then reapproximated the patient's platysma muscle with  interrupted 3-0 Vicryl suture, the subcutaneous tissues with interrupted 3-0  Vicryl suture, and the skin with Steri-Strips and Benzoin.  The wound was  then coated with bacitracin ointment and a sterile dressing applied.  The  drapes were removed.   The patient was subsequently extubated by the anesthesia team and  transported to the postanesthesia care unit in stable condition.  All  sponge, instrument and needle counts correct at the end of this case.      Cristi Loron, M.D. Electronically Signed     JDJ/MEDQ   D:  09/09/2006  T:  09/11/2006  Job:  [id number]

## 2011-05-04 NOTE — Op Note (Signed)
NAME:  [name], [name]                    ACCOUNT NO.:  [id number]   MEDICAL RECORD NO.:  [id number]                   PATIENT TYPE:  INP   LOCATION:  9313                                 FACILITY:  [facility]   PHYSICIAN:  Georgina Peer, M.D.              DATE OF BIRTH:  [birth year]   DATE OF PROCEDURE:  05/28/2003  DATE OF DISCHARGE:                                 OPERATIVE REPORT   PREOPERATIVE DIAGNOSES:  1. Dysmenorrhea.  2. Fibroid uterus.   POSTOPERATIVE DIAGNOSES:  1. Dysmenorrhea.  2. Fibroid uterus.   OPERATION PERFORMED:  Total abdominal hysterectomy.   SURGEON:  Georgina Peer, M.D.   ASSISTANT:  Roseanna Rainbow, M.D.   ESTIMATED BLOOD LOSS:  200 mL.   COMPLICATIONS:  Hematoma of the space of Retzius thought due to bladder  retraction, self-contained, not expanding, and drained.   OTHER FINDINGS:  Fibroid uterus.  Thin, filmy adhesions of the tube and  ovary and in the cul-de-sac, thought likely to previous gonorrheal  infection.   ANESTHESIA:  General.   INDICATIONS:  This is a 50 year old gravida 6, para 3, with a history of  heavy periods and a fibroid uterus and severe cramps.  She is admitted for  total abdominal hysterectomy with ovarian conservation.  Please see history  and physical for other details.   DESCRIPTION OF PROCEDURE:  The patient was taken to the operating room,  after informed consent she was given a general anesthetic, a Foley catheter  was placed in her bladder.  She was prepped and draped in the normal sterile  fashion.  A transverse Pfannenstiel incision was made in the skin and taken  into the peritoneal cavity in the normal  Pfannenstiel manner.  The  peritoneum was opened.  There appeared to be no evidence of adhesions.  Omentum looked normal.  Bowel looked normal.  Right ovary had a small  functional-appearing cyst.  There were some thin, filmy adhesions from the  right ovary and tube to the right posterior uterus,  which were easily lysed  by sharp and blunt dissection.  The cul-de-sac had some thin, filmy  adhesions from the rectosigmoid to the posterior cul-de-sac which were  easily lysed by sharp dissection.  The left ovary appeared normal, and the  tube was normal.  There was evidence of bilateral Falope ring from previous  tubal ligation.  The upper abdomen was free of disease.  The renal  structures appeared normal.  The patient was placed in Trendelenburg, the  bowel packed away, and a Balfour retractor and bladder blade were placed.  After the retractors were placed, there was noted to be a hematoma which  seemed to originate from the left sidewall and over the bladder.  It was  found that this was a hematoma and bleeding in the space of Retzius, which  was spontaneously resolved and was not expanding.  The surgery commenced by  grasping the  uterus with two Kelly clamps along the cornual area.  The round  ligaments bilaterally were clamped, cut, and suture ligated.  A bladder flap  anteriorly was created and the bladder pressed off the anterior uterine  segment and cervix.  The utero-ovarian ligaments were clamped, cut, and  suture ligated and doubly tied.  The broad ligaments were clamped, cut, and  suture ligated, the uterine vessels clamped, cut, and suture ligated, the  cardinal vessels clamped, cut, and suture ligated bilaterally.  The vagina  was entered on the right side when the uterosacral ligaments were clamped,  cut, and suture ligated.  The left uterosacral ligaments were clamped, cut,  and suture ligated and the rest of the specimen was removed.  A figure-of-  eight suture closed the rest of the vaginal cuff, and there was excellent  hemostasis.  There was closure of the peritoneal cavities after the  retractors and sponges were removed and the pelvis was irrigated and the  pedicles found to be hemostatic.  The space of Retzius again was inspected.  A Jackson-Pratt 7 mm drain was  placed into the space for drainage and the  drain was then guided out through an incision inferior to the skin incision  and to the left.  The fascia was closed with Vicryl suture, the subcutaneous  tissue closed with plain suture.  An On-Q pain management system was placed,  20 mL of 0.25% Marcaine was placed and injected into the skin.  The On-Q  catheter was placed and then secured and filled with 0.25% Marcaine.  The  skin was closed with subcuticular Monocryl and the skin incision was lined  with Steri-Strips.  Sponge, needle, and instrument counts were correct.  The  patient received 1 g of Ancef prior to the start of the procedure.  She was  returned to the recovery area in good condition.                                                Georgina Peer, M.D.    JPN/MEDQ  D:  05/28/2003  T:  05/28/2003  Job:  [id number]

## 2011-05-04 NOTE — H&P (Signed)
NAME:  [name], [name]                    ACCOUNT NO.:  [id number]   MEDICAL RECORD NO.:  [id number]                   PATIENT TYPE:  INP   LOCATION:  NA                                   FACILITY:  [facility]   PHYSICIAN:  Georgina Peer, M.D.              DATE OF BIRTH:  03-25-1961   DATE OF ADMISSION:  05/28/2003  DATE OF DISCHARGE:                                HISTORY & PHYSICAL   ADMISSION DIAGNOSES:  1. Abdominal pain.  2. Fibroid uterus.  3. Dysmenorrhea and menorrhagia.   HISTORY OF PRESENT ILLNESS:  This is a 50 year old, gravida 6, para 3-0-3-3,  with a history of heavy periods, painful periods, and a fibroid uterus. The  patient presents for correction of this surgically with total abdominal  hysterectomy.   PAST MEDICAL HISTORY:  The patient has had heavy periods. She has had a  history of gonorrhea during pregnancy and a history of tubal ligation,  history of headaches and migraines. She also has a history of kidney stones  which were treated surgically.  She has no other history of heart, lung, or  other kidney disease.   REVIEW OF SYSTEMS:  Otherwise negative.   SOCIAL HISTORY:  She smokes about 1/2 pack a day.  Ethanol consumption  socially.  No recreational drugs.   ALLERGIES:  No known drug allergies.   MEDICATIONS:  None.   PHYSICAL EXAMINATION:  GENERAL: This is a well-developed, well-nourished,  African-American female.  VITAL SIGNS:  Blood pressure 130/90, weight 148.6.  The patient has normal  nutritional status. Oriented to time, place, and person.  HEENT:  Grossly normal.  NECK:  Symmetrical without thyromegaly or JVD.  LUNGS:  Clear to auscultation and percussion.  HEART:  Regular rate and rhythm without murmurs, rubs, or gallops.  ABDOMEN: No masses, tenderness, hernia, or organomegaly.  PELVIC: External genitals normal hair distribution, BUS normal. Vagina with  no discharge or lesions. Cervix without discharge, lesions, or masses. The  patient has a globular, five-weeks size tender uterus, with no adnexal  masses.   LABORATORY DATA:  The patient had a normal Pap smear and she also had  negative cultures for STD's.   IMPRESSION:  Dysmenorrhea, fibroid uterus, menorrhagia, history of  gonorrhea.    PLAN:  Proceed with total abdominal hysterectomy with ovarian conservation.  She understands the risks and complications of pelvic surgery including  bleeding, infection, pelvic and skin infection, damage to the small or large  intestines, ureters, bladder, blood clots, or drug reactions.  She accepts  these and is willing to proceed.                                               Georgina Peer, M.D.    JPN/MEDQ  D:  05/27/2003  T:  05/27/2003  Job:  [id number]   cc:   Day Surgery, [facility]

## 2011-07-13 ENCOUNTER — Encounter (HOSPITAL_COMMUNITY): Payer: Self-pay | Admitting: *Deleted

## 2011-07-13 ENCOUNTER — Inpatient Hospital Stay (HOSPITAL_COMMUNITY)
Admission: AD | Admit: 2011-07-13 | Discharge: 2011-07-13 | Disposition: A | Discharge status: Home / Self Care | Payer: Self-pay | Source: Ambulatory Visit | Attending: Obstetrics & Gynecology | Admitting: Obstetrics & Gynecology

## 2011-07-13 DIAGNOSIS — N939 Abnormal uterine and vaginal bleeding, unspecified: Secondary | ICD-10-CM

## 2011-07-13 DIAGNOSIS — Z79899 Other long term (current) drug therapy: Secondary | ICD-10-CM | POA: Insufficient documentation

## 2011-07-13 DIAGNOSIS — F3289 Other specified depressive episodes: Secondary | ICD-10-CM | POA: Insufficient documentation

## 2011-07-13 DIAGNOSIS — J4489 Other specified chronic obstructive pulmonary disease: Secondary | ICD-10-CM | POA: Insufficient documentation

## 2011-07-13 DIAGNOSIS — F329 Major depressive disorder, single episode, unspecified: Secondary | ICD-10-CM | POA: Insufficient documentation

## 2011-07-13 DIAGNOSIS — N898 Other specified noninflammatory disorders of vagina: Secondary | ICD-10-CM | POA: Insufficient documentation

## 2011-07-13 DIAGNOSIS — J449 Chronic obstructive pulmonary disease, unspecified: Secondary | ICD-10-CM | POA: Insufficient documentation

## 2011-07-13 DIAGNOSIS — Z87891 Personal history of nicotine dependence: Secondary | ICD-10-CM | POA: Insufficient documentation

## 2011-07-13 DIAGNOSIS — R109 Unspecified abdominal pain: Secondary | ICD-10-CM | POA: Insufficient documentation

## 2011-07-13 HISTORY — DX: Anxiety disorder, unspecified: F41.9

## 2011-07-13 HISTORY — DX: Depression, unspecified: F32.A

## 2011-07-13 HISTORY — DX: Major depressive disorder, single episode, unspecified: F32.9

## 2011-07-13 HISTORY — DX: Chronic obstructive pulmonary disease, unspecified: J44.9

## 2011-07-13 LAB — URINE MICROSCOPIC-ADD ON

## 2011-07-13 LAB — WET PREP, GENITAL
Trich, Wet Prep: NONE SEEN
WBC, Wet Prep HPF POC: NONE SEEN
Yeast Wet Prep HPF POC: NONE SEEN

## 2011-07-13 LAB — URINALYSIS, ROUTINE W REFLEX MICROSCOPIC
Bilirubin Urine: NEGATIVE
Glucose, UA: NEGATIVE mg/dL
Ketones, ur: NEGATIVE mg/dL
Leukocytes, UA: NEGATIVE
Nitrite: NEGATIVE
Protein, ur: NEGATIVE mg/dL
Specific Gravity, Urine: >1.03 — ABNORMAL HIGH (ref 1.005–1.030)
Urobilinogen, UA: 0.2 mg/dL (ref 0.0–1.0)
pH: 6 (ref 5.0–8.0)

## 2011-07-13 MED ORDER — METRONIDAZOLE 500 MG PO TABS: 500.0000 mg | ORAL_TABLET | Freq: Two times a day (BID) | ORAL | Status: AC

## 2011-07-13 NOTE — Progress Notes (Signed)
Pt states, " I started having bleeding with cramping in my low abdomen on Monday.The bleeding lasted  for only one day, but Im still cramping and it's going down into my thighs. I had a hysterectomy in 2003, and I haven't had sex in 5 yrs.

## 2011-07-13 NOTE — ED Provider Notes (Signed)
History   Pt presents today c/o vag bleeding on Monday of this week and abd cramping. She states she had a TAH in 2003 and has not had intercourse in about 5 years. She denies vag irritation, fever, dysuria, or any other problems at this time.   Chief Complaint  Patient presents with  . Vaginal Bleeding   HPI  OB History    Grav Para Term Preterm Abortions TAB SAB Ect Mult Living   7 3 3  4 1 3   3       Past Medical History  Diagnosis Date  . Asthma   . Anxiety   . Depression   . COPD (chronic obstructive pulmonary disease)     Past Surgical History  Procedure Date  . Neck surgery   . Toe surgery   . Abdominal hysterectomy   . Tubal ligation   . Kidney stone surgery   . Hemorrhoid surgery   . Incise and drain abcess     No family history on file.  History  Substance Use Topics  . Smoking status: Former Games developer  . Smokeless tobacco: Not on file  . Alcohol Use: No    Allergies:  Allergies  Allergen Reactions  . Sulfa Antibiotics Nausea And Vomiting  . Sulfonamide Derivatives     REACTION: n/v    Prescriptions prior to admission  Medication Sig Dispense Refill  . aspirin-acetaminophen-caffeine (EXCEDRIN MIGRAINE) 250-250-65 MG per tablet Take 3 tablets by mouth every 6 (six) hours as needed. As needed for headache       . Aspirin-Acetaminophen-Caffeine (PAMPRIN MAX PO) Take 1 tablet by mouth every 4 (four) hours as needed. As needed for menstrual cramps       . Aspirin-Salicylamide-Caffeine (BC HEADACHE POWDER PO) Take 1 packet by mouth every 4 (four) hours as needed. As needed for headache       . citalopram (CELEXA) 10 MG tablet Take 10 mg by mouth daily.        . diphenhydrAMINE (BENADRYL) 25 MG tablet Take 25 mg by mouth every 6 (six) hours as needed. As needed for sleep and/or itching       . Fluticasone-Salmeterol (ADVAIR DISKUS) 500-50 MCG/DOSE AEPB Inhale 1 puff into the lungs daily.        . Ipratropium-Albuterol (COMBIVENT IN) Inhale 2 puffs into the  lungs every 4 (four) hours as needed. As needed for shortness of breath         Review of Systems  Constitutional: Negative for fever.  Gastrointestinal: Positive for abdominal pain. Negative for nausea, vomiting, diarrhea, constipation, blood in stool and melena.  Genitourinary: Negative for dysuria, urgency, frequency, hematuria and flank pain.  Neurological: Negative for dizziness and headaches.  Psychiatric/Behavioral: Negative for depression and suicidal ideas.   Physical Exam   Blood pressure 106/71, pulse 63, temperature 98.6 F (37 C), temperature source Oral, resp. rate 20, height 5' 1.5" (1.562 m), weight 161 lb 2 oz (73.086 kg).  Physical Exam  Constitutional: She is oriented to person, place, and time. She appears well-developed and well-nourished. No distress.  HENT:  Head: Normocephalic and atraumatic.  Eyes: EOM are normal. Pupils are equal, round, and reactive to light.  GI: Soft. She exhibits no distension and no mass. There is no tenderness. There is no rebound and no guarding.  Genitourinary: No tenderness or bleeding around the vagina. Vaginal discharge found.       Pt with some granulation tissue and a "band" of tissue at vaginal cuff. Small  area in midline of cuff that appears to be very thin. No defect at this time to peritoneal cavity. No bleeding noted at this time.  Neurological: She is alert and oriented to person, place, and time.  Skin: Skin is warm and dry. She is not diaphoretic.  Psychiatric: She has a normal mood and affect. Her behavior is normal. Judgment and thought content normal.    MAU Course  Procedures  Results for orders placed during the hospital encounter of 07/13/11 (from the past 24 hour(s))  URINALYSIS, ROUTINE W REFLEX MICROSCOPIC     Status: Abnormal   Collection Time   07/13/11  5:16 PM      Component Value Range   Color, Urine YELLOW  YELLOW    Appearance CLEAR  CLEAR    Specific Gravity, Urine >1.030 (*) 1.005 - 1.030    pH 6.0   5.0 - 8.0    Glucose, UA NEGATIVE  NEGATIVE (mg/dL)   Hgb urine dipstick TRACE (*) NEGATIVE    Bilirubin Urine NEGATIVE  NEGATIVE    Ketones, ur NEGATIVE  NEGATIVE (mg/dL)   Protein, ur NEGATIVE  NEGATIVE (mg/dL)   Urobilinogen, UA 0.2  0.0 - 1.0 (mg/dL)   Nitrite NEGATIVE  NEGATIVE    Leukocytes, UA NEGATIVE  NEGATIVE   URINE MICROSCOPIC-ADD ON     Status: Abnormal   Collection Time   07/13/11  5:16 PM      Component Value Range   Squamous Epithelial / LPF FEW (*) RARE    WBC, UA 0-2  <3 (WBC/hpf)   RBC / HPF 0-2  <3 (RBC/hpf)   Bacteria, UA FEW (*) RARE    Urine-Other MUCOUS PRESENT    WET PREP, GENITAL     Status: Abnormal   Collection Time   07/13/11  6:25 PM      Component Value Range   Yeast, Wet Prep NONE SEEN  NONE SEEN    Trich, Wet Prep NONE SEEN  NONE SEEN    Clue Cells, Wet Prep MODERATE (*) NONE SEEN    WBC, Wet Prep HPF POC NONE SEEN  NONE SEEN      Assessment and Plan  Discussed with pt at length. She will f/u in the GYN clinic to discuss cuff repair vs expectant management. Will give Rx for Flagyl for BV. Warned of antabuse reaction. Discussed diet, activity, risks, and precautions.  Clinton Gallant. Rice III, DrHSc, MPAS, PA-C  07/13/2011, 6:40 PM   Henrietta Hoover, PA 07/13/11 1910

## 2011-07-13 NOTE — Progress Notes (Signed)
Pt C/O heavy dark red bleeding 5 days ago, also cramping.  The bleeding only lasted one day, had a very foul odor.  Continues to have cramping.

## 2011-07-16 ENCOUNTER — Emergency Department (HOSPITAL_COMMUNITY)
Admission: EM | Admit: 2011-07-16 | Discharge: 2011-07-16 | Discharge status: Against Medical Advice | Payer: Self-pay | Attending: Emergency Medicine | Admitting: Emergency Medicine

## 2011-07-16 DIAGNOSIS — G43909 Migraine, unspecified, not intractable, without status migrainosus: Secondary | ICD-10-CM | POA: Insufficient documentation

## 2011-08-16 ENCOUNTER — Encounter: Payer: Self-pay | Admitting: Obstetrics & Gynecology

## 2011-08-16 ENCOUNTER — Ambulatory Visit (INDEPENDENT_AMBULATORY_CARE_PROVIDER_SITE_OTHER): Payer: Self-pay | Admitting: Obstetrics & Gynecology

## 2011-08-16 VITALS — BP 159/83 | HR 59 | Temp 97.9°F | Ht 63.0 in | Wt 161.8 lb

## 2011-08-16 DIAGNOSIS — N898 Other specified noninflammatory disorders of vagina: Secondary | ICD-10-CM

## 2011-08-16 DIAGNOSIS — N895 Stricture and atresia of vagina: Secondary | ICD-10-CM

## 2011-08-16 NOTE — Progress Notes (Signed)
Addended by: Sherre Lain A on: 08/16/2011 05:07 PM   Modules accepted: Orders

## 2011-08-16 NOTE — Progress Notes (Signed)
Subjective:    Patient ID: [name], female    DOB: Oct 23, 1961, 50 y.o.   MRN: [id number]  HPI pt notes lower abdominal pain for 2-3 months and vaginal spotting. She was seen in [facility] 07/13/11, a band of tissue was seen at the vaginal cuff.Still intermittent sl spotting and cramps  Past Medical History  Diagnosis Date  . Asthma   . Anxiety   . COPD (chronic obstructive pulmonary disease)   . Depression    Past Surgical History  Procedure Date  . Neck surgery   . Toe surgery   . Abdominal hysterectomy   . Tubal ligation   . Kidney stone surgery   . Hemorrhoid surgery   . Incise and drain abcess    Current Outpatient Prescriptions on File Prior to Visit  Medication Sig Dispense Refill  . albuterol (PROVENTIL) (5 MG/ML) 0.5% nebulizer solution Take 2.5 mg by nebulization at bedtime as needed. As needed for shortness of breath       . aspirin-acetaminophen-caffeine (EXCEDRIN MIGRAINE) 250-250-65 MG per tablet Take 3 tablets by mouth every 6 (six) hours as needed. As needed for headache       . Aspirin-Acetaminophen-Caffeine (PAMPRIN MAX PO) Take 1 tablet by mouth every 4 (four) hours as needed. As needed for menstrual cramps       . Aspirin-Salicylamide-Caffeine (BC HEADACHE POWDER PO) Take 1 packet by mouth every 4 (four) hours as needed. As needed for headache       . citalopram (CELEXA) 10 MG tablet Take 10 mg by mouth daily.        . diphenhydrAMINE (BENADRYL) 25 MG tablet Take 25 mg by mouth every 6 (six) hours as needed. As needed for sleep and/or itching       . Fluticasone-Salmeterol (ADVAIR DISKUS) 500-50 MCG/DOSE AEPB Inhale 1 puff into the lungs daily.        . Ipratropium-Albuterol (COMBIVENT IN) Inhale 2 puffs into the lungs every 4 (four) hours as needed. As needed for shortness of breath        Allergies  Allergen Reactions  . Sulfa Antibiotics Nausea And Vomiting  . Sulfonamide Derivatives     REACTION: n/v   Family History  Problem Relation Age of Onset    . Diabetes Mother   . COPD Mother   . Heart disease Mother   . Diabetes Father   . Kidney disease Father   . Cancer Father   . Diabetes Sister   . Hypertension Sister   . Diabetes Brother   . Heart disease Sister   . Diabetes Sister   . Heart disease Brother   . Sleep apnea Brother    History   Social History  . Marital Status: Divorced    Spouse Name: N/A    Number of Children: N/A  . Years of Education: N/A   Occupational History  . Not on file.   Social History Main Topics  . Smoking status: Current Some Day Smoker  . Smokeless tobacco: Not on file  . Alcohol Use: No  . Drug Use: No  . Sexually Active: Not Currently    Birth Control/ Protection: Surgical   Other Topics Concern  . Not on file   Social History Narrative  . No narrative on file     Review of Systemsvaginal spotting, slight discharge     Objective:   Physical Exam NAD pleasant Abd not tender Pelvic cuff well supported, thin 1-2 mm band stretched across the cuff, cauterized with  AgNo3. No mass, sl tender       Assessment & Plan:  Tissue band in vagina, cauterized. RTC 1 mo

## 2011-08-21 ENCOUNTER — Other Ambulatory Visit: Payer: Self-pay | Admitting: Obstetrics & Gynecology

## 2011-08-21 LAB — WET PREP, GENITAL
Clue Cells Wet Prep HPF POC: NONE SEEN
Trich, Wet Prep: NONE SEEN
Yeast Wet Prep HPF POC: NONE SEEN

## 2011-09-03 ENCOUNTER — Emergency Department (HOSPITAL_COMMUNITY)
Admission: EM | Admit: 2011-09-03 | Discharge: 2011-09-03 | Disposition: A | Discharge status: Home / Self Care | Payer: Self-pay | Attending: Emergency Medicine | Admitting: Emergency Medicine

## 2011-09-03 DIAGNOSIS — IMO0001 Reserved for inherently not codable concepts without codable children: Secondary | ICD-10-CM | POA: Insufficient documentation

## 2011-09-03 DIAGNOSIS — I10 Essential (primary) hypertension: Secondary | ICD-10-CM | POA: Insufficient documentation

## 2011-09-03 DIAGNOSIS — J45909 Unspecified asthma, uncomplicated: Secondary | ICD-10-CM | POA: Insufficient documentation

## 2011-09-03 LAB — DIFFERENTIAL
Basophils Absolute: 0 10*3/uL (ref 0.0–0.1)
Basophils Relative: 0 % (ref 0–1)
Eosinophils Absolute: 0.1 10*3/uL (ref 0.0–0.7)
Eosinophils Relative: 1 % (ref 0–5)
Lymphocytes Relative: 36 % (ref 12–46)
Lymphs Abs: 3.4 10*3/uL (ref 0.7–4.0)
Monocytes Absolute: 0.5 10*3/uL (ref 0.1–1.0)
Monocytes Relative: 6 % (ref 3–12)
Neutro Abs: 5.5 10*3/uL (ref 1.7–7.7)
Neutrophils Relative %: 57 % (ref 43–77)

## 2011-09-03 LAB — CBC
HCT: 37.9 % (ref 36.0–46.0)
Hemoglobin: 13.6 g/dL (ref 12.0–15.0)
MCH: 32 pg (ref 26.0–34.0)
MCHC: 35.9 g/dL (ref 30.0–36.0)
MCV: 89.2 fL (ref 78.0–100.0)
Platelets: 304 10*3/uL (ref 150–400)
RBC: 4.25 MIL/uL (ref 3.87–5.11)
RDW: 12.6 % (ref 11.5–15.5)
WBC: 9.5 10*3/uL (ref 4.0–10.5)

## 2011-09-03 LAB — BASIC METABOLIC PANEL
BUN: 11 mg/dL (ref 6–23)
CO2: 29 mEq/L (ref 19–32)
Calcium: 9.8 mg/dL (ref 8.4–10.5)
Chloride: 101 mEq/L (ref 96–112)
Creatinine, Ser: 0.63 mg/dL (ref 0.50–1.10)
GFR calc Af Amer: >60 mL/min (ref >60)
GFR calc non Af Amer: >60 mL/min (ref >60)
Glucose, Bld: 94 mg/dL (ref 70–99)
Potassium: 3.9 mEq/L (ref 3.5–5.1)
Sodium: 138 mEq/L (ref 135–145)

## 2011-09-03 LAB — URINALYSIS, ROUTINE W REFLEX MICROSCOPIC
Bilirubin Urine: NEGATIVE
Glucose, UA: NEGATIVE mg/dL
Hgb urine dipstick: NEGATIVE
Ketones, ur: NEGATIVE mg/dL
Leukocytes, UA: NEGATIVE
Nitrite: NEGATIVE
Protein, ur: NEGATIVE mg/dL
Specific Gravity, Urine: 1.014 (ref 1.005–1.030)
Urobilinogen, UA: 0.2 mg/dL (ref 0.0–1.0)
pH: 6.5 (ref 5.0–8.0)

## 2011-09-03 LAB — CK: Total CK: 176 U/L (ref 7–177)

## 2011-09-21 ENCOUNTER — Ambulatory Visit: Payer: Self-pay | Admitting: Obstetrics & Gynecology

## 2011-10-20 ENCOUNTER — Observation Stay (HOSPITAL_COMMUNITY)
Admission: EM | Admit: 2011-10-20 | Discharge: 2011-10-20 | Disposition: A | Discharge status: Home / Self Care | Payer: Self-pay | Attending: Emergency Medicine | Admitting: Emergency Medicine

## 2011-10-20 DIAGNOSIS — J45901 Unspecified asthma with (acute) exacerbation: Principal | ICD-10-CM | POA: Insufficient documentation

## 2011-10-20 DIAGNOSIS — I10 Essential (primary) hypertension: Secondary | ICD-10-CM | POA: Insufficient documentation

## 2011-10-21 ENCOUNTER — Inpatient Hospital Stay (HOSPITAL_COMMUNITY)
Admission: EM | Admit: 2011-10-21 | Discharge: 2011-10-26 | DRG: 189 | Disposition: A | Discharge status: Home / Self Care | Payer: Medicaid Other | Attending: Internal Medicine | Admitting: Internal Medicine

## 2011-10-21 ENCOUNTER — Other Ambulatory Visit: Payer: Self-pay

## 2011-10-21 ENCOUNTER — Encounter (HOSPITAL_COMMUNITY): Payer: Self-pay

## 2011-10-21 ENCOUNTER — Emergency Department (HOSPITAL_COMMUNITY): Payer: Medicaid Other

## 2011-10-21 DIAGNOSIS — J441 Chronic obstructive pulmonary disease with (acute) exacerbation: Secondary | ICD-10-CM | POA: Diagnosis present

## 2011-10-21 DIAGNOSIS — I739 Peripheral vascular disease, unspecified: Secondary | ICD-10-CM | POA: Diagnosis present

## 2011-10-21 DIAGNOSIS — D72829 Elevated white blood cell count, unspecified: Secondary | ICD-10-CM

## 2011-10-21 DIAGNOSIS — F329 Major depressive disorder, single episode, unspecified: Secondary | ICD-10-CM | POA: Diagnosis present

## 2011-10-21 DIAGNOSIS — I10 Essential (primary) hypertension: Secondary | ICD-10-CM | POA: Diagnosis present

## 2011-10-21 DIAGNOSIS — Z87891 Personal history of nicotine dependence: Secondary | ICD-10-CM

## 2011-10-21 DIAGNOSIS — J4489 Other specified chronic obstructive pulmonary disease: Secondary | ICD-10-CM | POA: Diagnosis present

## 2011-10-21 DIAGNOSIS — F411 Generalized anxiety disorder: Secondary | ICD-10-CM | POA: Diagnosis present

## 2011-10-21 DIAGNOSIS — Z86718 Personal history of other venous thrombosis and embolism: Secondary | ICD-10-CM

## 2011-10-21 DIAGNOSIS — R0602 Shortness of breath: Secondary | ICD-10-CM

## 2011-10-21 DIAGNOSIS — J45901 Unspecified asthma with (acute) exacerbation: Secondary | ICD-10-CM

## 2011-10-21 DIAGNOSIS — Z7982 Long term (current) use of aspirin: Secondary | ICD-10-CM

## 2011-10-21 DIAGNOSIS — M129 Arthropathy, unspecified: Secondary | ICD-10-CM | POA: Diagnosis present

## 2011-10-21 DIAGNOSIS — G43909 Migraine, unspecified, not intractable, without status migrainosus: Secondary | ICD-10-CM | POA: Diagnosis present

## 2011-10-21 DIAGNOSIS — F3289 Other specified depressive episodes: Secondary | ICD-10-CM | POA: Diagnosis present

## 2011-10-21 DIAGNOSIS — J449 Chronic obstructive pulmonary disease, unspecified: Secondary | ICD-10-CM | POA: Diagnosis present

## 2011-10-21 DIAGNOSIS — J069 Acute upper respiratory infection, unspecified: Secondary | ICD-10-CM | POA: Diagnosis present

## 2011-10-21 DIAGNOSIS — R0789 Other chest pain: Secondary | ICD-10-CM

## 2011-10-21 DIAGNOSIS — J9600 Acute respiratory failure, unspecified whether with hypoxia or hypercapnia: Principal | ICD-10-CM | POA: Diagnosis present

## 2011-10-21 DIAGNOSIS — R51 Headache: Secondary | ICD-10-CM

## 2011-10-21 DIAGNOSIS — R061 Stridor: Secondary | ICD-10-CM | POA: Diagnosis present

## 2011-10-21 DIAGNOSIS — I82409 Acute embolism and thrombosis of unspecified deep veins of unspecified lower extremity: Secondary | ICD-10-CM

## 2011-10-21 DIAGNOSIS — J309 Allergic rhinitis, unspecified: Secondary | ICD-10-CM

## 2011-10-21 DIAGNOSIS — J45909 Unspecified asthma, uncomplicated: Secondary | ICD-10-CM | POA: Diagnosis present

## 2011-10-21 HISTORY — DX: Peripheral vascular disease, unspecified: I73.9

## 2011-10-21 HISTORY — DX: Unspecified osteoarthritis, unspecified site: M19.90

## 2011-10-21 HISTORY — DX: Gastro-esophageal reflux disease without esophagitis: K21.9

## 2011-10-21 HISTORY — DX: Angina pectoris, unspecified: I20.9

## 2011-10-21 LAB — URINALYSIS, ROUTINE W REFLEX MICROSCOPIC
Bilirubin Urine: NEGATIVE
Glucose, UA: NEGATIVE mg/dL
Ketones, ur: NEGATIVE mg/dL
Leukocytes, UA: NEGATIVE
Nitrite: NEGATIVE
Protein, ur: NEGATIVE mg/dL
Specific Gravity, Urine: 1.015 (ref 1.005–1.030)
Urobilinogen, UA: 0.2 mg/dL (ref 0.0–1.0)
pH: 5 (ref 5.0–8.0)

## 2011-10-21 LAB — CBC
HCT: 38.1 % (ref 36.0–46.0)
Hemoglobin: 13.2 g/dL (ref 12.0–15.0)
MCH: 31.7 pg (ref 26.0–34.0)
MCHC: 34.6 g/dL (ref 30.0–36.0)
MCV: 91.4 fL (ref 78.0–100.0)
Platelets: 284 10*3/uL (ref 150–400)
RBC: 4.17 MIL/uL (ref 3.87–5.11)
RDW: 13.2 % (ref 11.5–15.5)
WBC: 21.8 10*3/uL — ABNORMAL HIGH (ref 4.0–10.5)

## 2011-10-21 LAB — BASIC METABOLIC PANEL
BUN: 14 mg/dL (ref 6–23)
CO2: 29 mEq/L (ref 19–32)
Calcium: 10.6 mg/dL — ABNORMAL HIGH (ref 8.4–10.5)
Chloride: 101 mEq/L (ref 96–112)
Creatinine, Ser: 0.67 mg/dL (ref 0.50–1.10)
GFR calc Af Amer: >90 mL/min (ref >90)
GFR calc non Af Amer: >90 mL/min (ref >90)
Glucose, Bld: 91 mg/dL (ref 70–99)
Potassium: 4.2 mEq/L (ref 3.5–5.1)
Sodium: 141 mEq/L (ref 135–145)

## 2011-10-21 LAB — POCT I-STAT TROPONIN I: Troponin i, poc: 0 ng/mL (ref 0.00–0.08)

## 2011-10-21 LAB — URINE MICROSCOPIC-ADD ON

## 2011-10-21 MED ORDER — ALBUTEROL SULFATE (5 MG/ML) 0.5% IN NEBU
2.5000 mg | INHALATION_SOLUTION | Freq: Once | RESPIRATORY_TRACT | Status: AC
  Administered 2011-10-21: 2.5 mg via RESPIRATORY_TRACT

## 2011-10-21 MED ORDER — IPRATROPIUM BROMIDE 0.02 % IN SOLN
RESPIRATORY_TRACT | Status: AC
  Administered 2011-10-21: 0.5 mg via RESPIRATORY_TRACT
  Filled 2011-10-21: qty 2.5

## 2011-10-21 MED ORDER — MAGNESIUM SULFATE 40 MG/ML IJ SOLN
4.0000 g | Freq: Once | INTRAMUSCULAR | Status: AC
  Administered 2011-10-22: 4 g via INTRAVENOUS
  Filled 2011-10-21 (×2): qty 100

## 2011-10-21 MED ORDER — IPRATROPIUM BROMIDE HFA 17 MCG/ACT IN AERS: 2.0000 | INHALATION_SPRAY | Freq: Once | RESPIRATORY_TRACT | Status: DC

## 2011-10-21 MED ORDER — ALBUTEROL SULFATE (5 MG/ML) 0.5% IN NEBU
10.0000 mg | INHALATION_SOLUTION | Freq: Once | RESPIRATORY_TRACT | Status: AC
  Administered 2011-10-21: 10 mg via RESPIRATORY_TRACT

## 2011-10-21 MED ORDER — ALBUTEROL SULFATE (5 MG/ML) 0.5% IN NEBU
10.0000 mg | INHALATION_SOLUTION | Freq: Once | RESPIRATORY_TRACT | Status: AC
  Administered 2011-10-21: 2.5 mg via RESPIRATORY_TRACT

## 2011-10-21 MED ORDER — IPRATROPIUM BROMIDE 0.02 % IN SOLN
0.5000 mg | Freq: Once | RESPIRATORY_TRACT | Status: AC
  Administered 2011-10-21: 0.5 mg via RESPIRATORY_TRACT

## 2011-10-21 MED ORDER — METHYLPREDNISOLONE SODIUM SUCC 125 MG IJ SOLR
125.0000 mg | Freq: Once | INTRAMUSCULAR | Status: AC
  Administered 2011-10-21: 125 mg via INTRAVENOUS
  Filled 2011-10-21: qty 2

## 2011-10-21 NOTE — ED Notes (Signed)
Called respiratory therapist to come and give pt hr long neb of 10mg  Albuterol

## 2011-10-21 NOTE — ED Notes (Signed)
Dr. Loman Brooklyn in to assess pt for admission

## 2011-10-21 NOTE — ED Notes (Signed)
Pt aware of the urine sample needed, unable to use the bathroom at this time

## 2011-10-21 NOTE — ED Notes (Signed)
Pt continues to c/o shortness of breath, pt with audible wheezing.   Also c/o bil calf pain.  C/o feeling hot

## 2011-10-21 NOTE — ED Provider Notes (Signed)
History     CSN: [id number] Arrival date & time: 10/21/2011  2:09 PM   First MD Initiated Contact with Patient 10/21/11 1640      Chief Complaint  Patient presents with  . Asthma    (Consider location/radiation/quality/duration/timing/severity/associated sxs/prior treatment) HPI Comments: Patient was seen in the Emergency Department for the same complaint yesterday.  She has a history of asthma and ran out of her Albuterol inhaler.  Yesterday she was seen by the respiratory therapist and was put on the asthma protocol in the CDU.  She was given Solumedrol and a couple of hour long nebulizer treatments.  Her symptoms had improved and she was discharged home.  She began feeling short of breath again this morning.  She reports that at this point she is feeling worse than she did yesterday.  She is feeling some tightness in her chest and is wheezing.  She tried using her Albuterol inhaler at home with very minimal relief.    Patient is a 50 y.o. female presenting with shortness of breath.  Shortness of Breath  The current episode started 2 days ago. The problem has been gradually worsening. The problem is moderate. The symptoms are relieved by nothing. Associated symptoms include chest pain, chest pressure, orthopnea, cough, shortness of breath and wheezing. Pertinent negatives include no fever, no rhinorrhea, no sore throat and no stridor. Her past medical history is significant for asthma. There were no sick contacts. Recently, medical care has been given at this facility.    Past Medical History  Diagnosis Date  . Asthma   . Anxiety   . COPD (chronic obstructive pulmonary disease)   . Angina   . Shortness of breath   . Depression   . Hypertension   . GERD (gastroesophageal reflux disease)   . Headache   . Arthritis   . Peripheral vascular disease     Past Surgical History  Procedure Date  . Neck surgery   . Toe surgery   . Abdominal hysterectomy   . Tubal ligation   . Kidney  stone surgery   . Hemorrhoid surgery   . Incise and drain abcess   . Back surgery   . Diagnostic laparoscopy     Family History  Problem Relation Age of Onset  . Diabetes Mother   . COPD Mother   . Heart disease Mother   . Diabetes Father   . Kidney disease Father   . Cancer Father   . Anesthesia problems Father   . Diabetes Sister   . Hypertension Sister   . Diabetes Brother   . Sleep apnea Brother   . Heart disease Sister   . Diabetes Sister   . Heart disease Brother     History  Substance Use Topics  . Smoking status: Former Smoker    Quit date: 04/20/2011  . Smokeless tobacco: Not on file  . Alcohol Use: No    OB History    Grav Para Term Preterm Abortions TAB SAB Ect Mult Living   7 3 3  4 1 3   3       Review of Systems  Constitutional: Negative for fever and chills.  HENT: Negative for congestion, sore throat, rhinorrhea, neck pain and neck stiffness.   Eyes: Negative for redness and itching.  Respiratory: Positive for cough, chest tightness, shortness of breath and wheezing. Negative for stridor.   Cardiovascular: Positive for chest pain and orthopnea. Negative for leg swelling.  Gastrointestinal: Negative for abdominal distention.  Neurological:  Negative for dizziness, tremors, syncope and weakness.  Hematological: Negative for adenopathy. Does not bruise/bleed easily.    Allergies  Sulfa antibiotics and Sulfonamide derivatives  Home Medications   No current outpatient prescriptions on file.  BP 120/78  Pulse 67  Temp(Src) 97.5 F (36.4 C) (Oral)  Resp 18  Ht 5\' 3"  (1.6 m)  Wt 164 lb 7.4 oz (74.6 kg)  BMI 29.13 kg/m2  SpO2 94%  Physical Exam  Constitutional: She appears well-developed and well-nourished.  HENT:  Head: Normocephalic and atraumatic.  Right Ear: External ear normal.  Left Ear: External ear normal.  Nose: Nose normal.  Mouth/Throat: Oropharynx is clear and moist. No oropharyngeal exudate.  Eyes: EOM are normal. Pupils are  equal, round, and reactive to light.  Neck: Normal range of motion. Neck supple.  Cardiovascular: Regular rhythm and intact distal pulses.  Tachycardia present.   No murmur heard. Pulmonary/Chest: Accessory muscle usage present. Not tachypneic. She is in respiratory distress. She has wheezes. She has rhonchi. She has no rales. She exhibits no tenderness.       Diffuse expiratory wheezing  Skin: She is not diaphoretic.    ED Course  Procedures (including critical care time)   Labs Reviewed  CBC - Abnormal; Notable for the following:    WBC 21.8 (*)    All other components within normal limits  BASIC METABOLIC PANEL - Abnormal; Notable for the following:    Calcium 10.6 (*)    All other components within normal limits  URINALYSIS, ROUTINE W REFLEX MICROSCOPIC - Abnormal; Notable for the following:    Hgb urine dipstick SMALL (*)    All other components within normal limits  URINE MICROSCOPIC-ADD ON - Abnormal; Notable for the following:    Squamous Epithelial / LPF FEW (*)    All other components within normal limits  POCT I-STAT 3, BLOOD GAS (G3+) - Abnormal; Notable for the following:    pO2, Arterial 133.0 (*)    Bicarbonate 26.6 (*)    All other components within normal limits  BASIC METABOLIC PANEL - Abnormal; Notable for the following:    Glucose, Bld 153 (*)    All other components within normal limits  CBC - Abnormal; Notable for the following:    WBC 29.2 (*)    HCT 35.7 (*)    All other components within normal limits  CBC - Abnormal; Notable for the following:    WBC 25.7 (*)    All other components within normal limits  CARDIAC PANEL(CRET KIN+CKTOT+MB+TROPI) - Abnormal; Notable for the following:    Total CK 307 (*)    All other components within normal limits  CARDIAC PANEL(CRET KIN+CKTOT+MB+TROPI) - Abnormal; Notable for the following:    Total CK 327 (*)    CK, MB 4.1 (*)    All other components within normal limits  CARDIAC PANEL(CRET KIN+CKTOT+MB+TROPI) -  Abnormal; Notable for the following:    Total CK 259 (*)    All other components within normal limits  TSH - Abnormal; Notable for the following:    TSH 0.335 (*)    All other components within normal limits  CBC - Abnormal; Notable for the following:    WBC 28.7 (*)    RBC 3.80 (*)    Hemoglobin 11.7 (*)    HCT 34.4 (*)    All other components within normal limits  COMPREHENSIVE METABOLIC PANEL - Abnormal; Notable for the following:    Albumin 3.4 (*)    All other  components within normal limits  POCT I-STAT TROPONIN I  CREATININE, SERUM  CULTURE, BLOOD (ROUTINE X 2)  CULTURE, BLOOD (ROUTINE X 2)  DRUGS OF ABUSE SCREEN W/O ALC, ROUTINE URINE  MRSA PCR SCREENING  HEPATIC FUNCTION PANEL  T4, FREE   No results found. Assumed care of patient in the CDU.  Reassessed patient.  Patient having wheezing.  She is on the asthma protocol.  Oxygen sat 98. Reassessed patient after one hour nebulized treatment and solumedrol.  Patient reports that she does not feel that her symptoms have improved.  Diffuse wheezing heard on exam.  Oxygen sat 97. Discussed patient with [facility] who will come see patient in the ED and admit her.  1. Asthma exacerbation   2. Other chest pain       MDM  Patient in mild respiratory distress.   Was here yesterday for similar symptoms and discharged home.  Patient put on asthma protocol in the CDU and then admitted to medicine service for asthma exacerbation and respiratory distress.        Pascal Lux [facility] 10/25/11 1655

## 2011-10-21 NOTE — ED Provider Notes (Signed)
History     CSN: [id number] Arrival date & time: 10/21/2011  2:09 PM   First MD Initiated Contact with Patient 10/21/11 1640      Chief Complaint  Patient presents with  . Asthma    (Consider location/radiation/quality/duration/timing/severity/associated sxs/prior treatment) Patient is a 50 y.o. female presenting with asthma.  Asthma   patient has no history of asthma. She reports shortness of breath and chest pressure. She also has congestion. She's been using her inhaler her inhaler with mild relief. In fact, patient was seen by myself yesterday and was put on the asthma protocol. She did go home yesterday afternoon. She states she is still having a difficult time breathing. She's had cough, but no fevers. She denies any peripheral edema. She has had weakness, but no syncope.  Past Medical History  Diagnosis Date  . Asthma   . Anxiety   . COPD (chronic obstructive pulmonary disease)   . Depression     Past Surgical History  Procedure Date  . Neck surgery   . Toe surgery   . Abdominal hysterectomy   . Tubal ligation   . Kidney stone surgery   . Hemorrhoid surgery   . Incise and drain abcess     Family History  Problem Relation Age of Onset  . Diabetes Mother   . COPD Mother   . Heart disease Mother   . Diabetes Father   . Kidney disease Father   . Cancer Father   . Diabetes Sister   . Hypertension Sister   . Diabetes Brother   . Heart disease Sister   . Diabetes Sister   . Heart disease Brother   . Sleep apnea Brother     History  Substance Use Topics  . Smoking status: Former Smoker    Quit date: 04/20/2011  . Smokeless tobacco: Not on file  . Alcohol Use: No    OB History    Grav Para Term Preterm Abortions TAB SAB Ect Mult Living   7 3 3  4 1 3   3       Review of Systems  All other systems reviewed and are negative.    Allergies  Sulfa antibiotics and Sulfonamide derivatives  Home Medications   Current Outpatient Rx  Name Route Sig  Dispense Refill  . ALBUTEROL SULFATE (5 MG/ML) 0.5% IN NEBU Nebulization Take 2.5 mg by nebulization at bedtime as needed. As needed for shortness of breath     . BC HEADACHE POWDER PO Oral Take 1 packet by mouth every 4 (four) hours as needed. As needed for headache     . CITALOPRAM HYDROBROMIDE 10 MG PO TABS Oral Take 10 mg by mouth daily.      Marland Kitchen DIPHENHYDRAMINE HCL 25 MG PO TABS Oral Take 25 mg by mouth every 6 (six) hours as needed. As needed for sleep and/or itching     . FLUTICASONE-SALMETEROL 500-50 MCG/DOSE IN AEPB Inhalation Inhale 1 puff into the lungs daily.     Effie Berkshire IN Inhalation Inhale 2 puffs into the lungs every 4 (four) hours as needed. As needed for shortness of breath     . ASPIRIN-ACETAMINOPHEN-CAFFEINE 250-250-65 MG PO TABS Oral Take 3 tablets by mouth every 6 (six) hours as needed. As needed for headache       BP 154/85  Pulse 102  Temp(Src) 98 F (36.7 C) (Oral)  Resp 22  SpO2 97%  Physical Exam  Constitutional: She is oriented to person, place, and time. She  appears well-developed and well-nourished.  HENT:  Head: Normocephalic and atraumatic.  Eyes: Conjunctivae and EOM are normal. Pupils are equal, round, and reactive to light.  Neck: Neck supple.  Cardiovascular: Regular rhythm and normal heart sounds.  Exam reveals no gallop and no friction rub.   No murmur heard. Pulmonary/Chest: She is in respiratory distress. She has wheezes. She has no rales. She exhibits no tenderness.       The patient is coughing, diffuse expiratory wheezing. No retractions. Speaking in broken sentences. Mild respiratory distress. No accessory muscle usage noted.  Abdominal: Soft. Bowel sounds are normal. She exhibits no distension. There is no tenderness. There is no rebound and no guarding.  Musculoskeletal: Normal range of motion.  Neurological: She is alert and oriented to person, place, and time. No cranial nerve deficit. Coordination normal.  Skin: Skin is warm and dry. No  rash noted.  Psychiatric: She has a normal mood and affect.    ED Course  Procedures (including critical care time)  Labs Reviewed - No data to display No results found.   No diagnosis found.    MDM  Patient is seen and examined, initial history and physical is completed. Evaluation initiated        Atwood Adcock A. Patrica Duel, MD 10/21/11 [phone]

## 2011-10-21 NOTE — ED Notes (Signed)
Pt now states that she has been pressure on chest since last night. EKG done at triage.

## 2011-10-21 NOTE — ED Notes (Signed)
Pt c/o SOB. Work of breathing is increased. Pt states that her sx started yesterday and used inhaler, but experienced no relief. Pt placed on 2l o2 [place] in triage. Pt also congestion, n/v, and headache since last night.

## 2011-10-21 NOTE — H&P (Signed)
[name] is an 50 y.o. female.   Chief Complaint: Shortness of breath HPI: 50 year old female with h/o asthma/COPD presents with shortness of breath which has been ongoing for last week. In addition patient has been having cough with productive sputum and some exertional chest pressure last two days. She had come to ER yesterday and was treated with nebs despite which patient had persistent symptoms which made her to come to ER today. Today Chest xray is nehative for anything acute but patient still has wheezing. Patient will be admitted for further management.  Past Medical History  Diagnosis Date  . Asthma   . Anxiety   . COPD (chronic obstructive pulmonary disease)   . Depression     Past Surgical History  Procedure Date  . Neck surgery   . Toe surgery   . Abdominal hysterectomy   . Tubal ligation   . Kidney stone surgery   . Hemorrhoid surgery   . Incise and drain abcess     Family History  Problem Relation Age of Onset  . Diabetes Mother   . COPD Mother   . Heart disease Mother   . Diabetes Father   . Kidney disease Father   . Cancer Father   . Diabetes Sister   . Hypertension Sister   . Diabetes Brother   . Heart disease Sister   . Diabetes Sister   . Heart disease Brother   . Sleep apnea Brother    Social History:  reports that she quit smoking about 6 months ago. She does not have any smokeless tobacco history on file. She reports that she does not drink alcohol or use illicit drugs.  Allergies:  Allergies  Allergen Reactions  . Sulfa Antibiotics Nausea And Vomiting  . Sulfonamide Derivatives Nausea And Vomiting    REACTION: n/v    Medications Prior to Admission  Medication Dose Route Frequency Provider Last Rate Last Dose  . albuterol (PROVENTIL) (5 MG/ML) 0.5% nebulizer solution 10 mg  10 mg Nebulization Once Peter A. Tucich, MD   2.5 mg at 10/21/11 1700  . albuterol (PROVENTIL) (5 MG/ML) 0.5% nebulizer solution 10 mg  10 mg Nebulization Once  Peter A. Patrica Duel, MD   10 mg at 10/21/11 1905  . albuterol (PROVENTIL) (5 MG/ML) 0.5% nebulizer solution 2.5 mg  2.5 mg Nebulization Once Peter A. Tucich, MD   2.5 mg at 10/21/11 1453  . ipratropium (ATROVENT) nebulizer solution 0.5 mg  0.5 mg Nebulization Once Peter A. Tucich, MD   0.5 mg at 10/21/11 1700  . ipratropium (ATROVENT) nebulizer solution 0.5 mg  0.5 mg Nebulization Once Peter A. Patrica Duel, MD   0.5 mg at 10/21/11 1905  . magnesium sulfate IVPB 4 g  4 g Intravenous Once Raeford Razor, MD      . methylPREDNISolone sodium succinate (SOLU-MEDROL) 125 MG injection 125 mg  125 mg Intravenous Once Peter A. Tucich, MD   125 mg at 10/21/11 1700  . DISCONTD: ipratropium (ATROVENT HFA) inhaler 2 puff  2 puff Inhalation Once Peter A. Patrica Duel, MD       Medications Prior to Admission  Medication Sig Dispense Refill  . albuterol (PROVENTIL) (5 MG/ML) 0.5% nebulizer solution Take 2.5 mg by nebulization at bedtime as needed. As needed for shortness of breath       . Aspirin-Salicylamide-Caffeine (BC HEADACHE POWDER PO) Take 1 packet by mouth every 4 (four) hours as needed. As needed for headache       . citalopram (  CELEXA) 10 MG tablet Take 10 mg by mouth daily.        . diphenhydrAMINE (BENADRYL) 25 MG tablet Take 25 mg by mouth every 6 (six) hours as needed. As needed for sleep and/or itching       . Fluticasone-Salmeterol (ADVAIR DISKUS) 500-50 MCG/DOSE AEPB Inhale 1 puff into the lungs daily.       . Ipratropium-Albuterol (COMBIVENT IN) Inhale 2 puffs into the lungs every 4 (four) hours as needed. As needed for shortness of breath       . aspirin-acetaminophen-caffeine (EXCEDRIN MIGRAINE) 250-250-65 MG per tablet Take 3 tablets by mouth every 6 (six) hours as needed. As needed for headache         Results for orders placed during the hospital encounter of 10/21/11 (from the past 48 hour(s))  CBC     Status: Abnormal   Collection Time   10/21/11  4:50 PM      Component Value Range Comment   WBC 21.8  (*) 4.0 - 10.5 (K/uL)    RBC 4.17  3.87 - 5.11 (MIL/uL)    Hemoglobin 13.2  12.0 - 15.0 (g/dL)    HCT 16.1  09.6 - 04.5 (%)    MCV 91.4  78.0 - 100.0 (fL)    MCH 31.7  26.0 - 34.0 (pg)    MCHC 34.6  30.0 - 36.0 (g/dL)    RDW 40.9  81.1 - 91.4 (%)    Platelets 284  150 - 400 (K/uL)   BASIC METABOLIC PANEL     Status: Abnormal   Collection Time   10/21/11  4:50 PM      Component Value Range Comment   Sodium 141  135 - 145 (mEq/L)    Potassium 4.2  3.5 - 5.1 (mEq/L)    Chloride 101  96 - 112 (mEq/L)    CO2 29  19 - 32 (mEq/L)    Glucose, Bld 91  70 - 99 (mg/dL)    BUN 14  6 - 23 (mg/dL)    Creatinine, Ser 7.82  0.50 - 1.10 (mg/dL)    Calcium 95.6 (*) 8.4 - 10.5 (mg/dL)    GFR calc non Af Amer >90  >90 (mL/min)    GFR calc Af Amer >90  >90 (mL/min)   POCT I-STAT TROPONIN I     Status: Normal   Collection Time   10/21/11  8:51 PM      Component Value Range Comment   Troponin i, poc 0.00  0.00 - 0.08 (ng/mL)    Comment 3             Dg Chest 2 View  10/21/2011  *RADIOLOGY REPORT*  Clinical Data: Chest pain with extreme shortness of breath.  CHEST - 2 VIEW  Comparison: 10/19/2010.  Findings: The heart size and mediastinal contours are normal. The lungs are clear. There is no pleural effusion or pneumothorax. No acute osseous findings are identified.  There are postsurgical changes status post lower cervical fusion.  IMPRESSION: Stable examination.  No active cardiopulmonary process.  Original Report Authenticated By: Gerrianne Scale, M.D.    Review of Systems  Constitutional: Negative.   HENT: Negative.   Eyes: Negative.   Respiratory: Positive for cough, sputum production, shortness of breath and wheezing.   Cardiovascular: Positive for chest pain.  Gastrointestinal: Negative.   Genitourinary: Negative.   Musculoskeletal: Negative.   Skin: Negative.   Neurological: Negative.   Endo/Heme/Allergies: Negative.   Psychiatric/Behavioral: Negative.  Blood pressure 132/85,  pulse 105, temperature 98.1 F (36.7 C), temperature source Oral, resp. rate 22, SpO2 95.00%. Physical Exam  Constitutional: She is oriented to person, place, and time. She appears well-developed and well-nourished.  HENT:  Head: Normocephalic and atraumatic.  Eyes: Conjunctivae and EOM are normal. Pupils are equal, round, and reactive to light.  Neck: Normal range of motion. Neck supple.  Cardiovascular: Normal rate and regular rhythm.   Respiratory: Effort normal. She has wheezes. She has rales.  GI: Soft. Bowel sounds are normal.  Musculoskeletal: Normal range of motion.  Neurological: She is alert and oriented to person, place, and time. She has normal reflexes.  Skin: Skin is warm and dry.  Psychiatric: Her behavior is normal.     Assessment/Plan 1)[place]/ COPD exacerbation 2)Chest pressure 3)Leucocytosis 4)H/ cigarette smoking 5)H/O migraine headaches  Plan: Admit to telemetry For her shortness of breath will treat with nebs and steroids and antibiotics. Slowly change iv stroids to PO prednisone. Patient does notice some chest tightness on exertion. Presently chest pain free. Will cycle cardiac markers and get 2D echo and CT Angio chest Check drug screen. For her significant leucocytosis will treat her brochitis with levaquin and follow CBC. Also get blood cultures.  Eduard Clos 10/21/2011, 10:43 PM

## 2011-10-21 NOTE — ED Notes (Signed)
Pt st's no relief from [facility], continues to have audible wheezing c/o feeling like something is sitting on her chest.  PA made aware.

## 2011-10-22 ENCOUNTER — Inpatient Hospital Stay (HOSPITAL_COMMUNITY): Payer: Medicaid Other

## 2011-10-22 ENCOUNTER — Encounter (HOSPITAL_COMMUNITY): Payer: Self-pay | Admitting: *Deleted

## 2011-10-22 LAB — CBC
HCT: 36.2 % (ref 36.0–46.0)
Hemoglobin: 12.3 g/dL (ref 12.0–15.0)
MCH: 30.9 pg (ref 26.0–34.0)
MCHC: 34 g/dL (ref 30.0–36.0)
MCV: 91 fL (ref 78.0–100.0)
Platelets: 288 10*3/uL (ref 150–400)
RBC: 3.98 MIL/uL (ref 3.87–5.11)
RDW: 13.2 % (ref 11.5–15.5)
WBC: 25.7 10*3/uL — ABNORMAL HIGH (ref 4.0–10.5)

## 2011-10-22 LAB — POCT I-STAT 3, ART BLOOD GAS (G3+)
Acid-Base Excess: 2 mmol/L (ref 0.0–2.0)
Bicarbonate: 26.6 mEq/L — ABNORMAL HIGH (ref 20.0–24.0)
O2 Saturation: 99 %
Patient temperature: 99.3
TCO2: 28 mmol/L (ref 0–100)
pCO2 arterial: 43.2 mmHg (ref 35.0–45.0)
pH, Arterial: 7.399 (ref 7.350–7.400)
pO2, Arterial: 133 mmHg — ABNORMAL HIGH (ref 80.0–100.0)

## 2011-10-22 LAB — CREATININE, SERUM
Creatinine, Ser: 0.54 mg/dL (ref 0.50–1.10)
GFR calc Af Amer: >90 mL/min (ref >90)
GFR calc non Af Amer: >90 mL/min (ref >90)

## 2011-10-22 LAB — CARDIAC PANEL(CRET KIN+CKTOT+MB+TROPI)
CK, MB: 3.6 ng/mL (ref 0.3–4.0)
CK, MB: 4.1 ng/mL — ABNORMAL HIGH (ref 0.3–4.0)
Relative Index: 1.2 (ref 0.0–2.5)
Relative Index: 1.3 (ref 0.0–2.5)
Total CK: 307 U/L — ABNORMAL HIGH (ref 7–177)
Total CK: 327 U/L — ABNORMAL HIGH (ref 7–177)
Troponin I: <0.3 ng/mL (ref <0.30)
Troponin I: <0.3 ng/mL (ref <0.30)

## 2011-10-22 LAB — MRSA PCR SCREENING: MRSA by PCR: NEGATIVE

## 2011-10-22 MED ORDER — BISACODYL 10 MG RE SUPP
10.0000 mg | Freq: Every day | RECTAL | Status: DC | PRN
  Administered 2011-10-26: 10 mg via RECTAL
  Filled 2011-10-22 (×2): qty 1

## 2011-10-22 MED ORDER — BUDESONIDE 0.5 MG/2ML IN SUSP
0.5000 mg | Freq: Two times a day (BID) | RESPIRATORY_TRACT | Status: DC
  Administered 2011-10-22 – 2011-10-23 (×3): 0.5 mg via RESPIRATORY_TRACT
  Filled 2011-10-22 (×5): qty 2

## 2011-10-22 MED ORDER — ONDANSETRON HCL 4 MG PO TABS
4.0000 mg | ORAL_TABLET | Freq: Four times a day (QID) | ORAL | Status: DC | PRN
  Filled 2011-10-22: qty 1

## 2011-10-22 MED ORDER — SODIUM CHLORIDE 0.9 % IV SOLN
250.0000 mL | INTRAVENOUS | Status: DC
  Administered 2011-10-22: 250 mL via INTRAVENOUS
  Administered 2011-10-22: 1000 mL via INTRAVENOUS

## 2011-10-22 MED ORDER — SODIUM CHLORIDE 0.9 % IJ SOLN
3.0000 mL | Freq: Two times a day (BID) | INTRAMUSCULAR | Status: DC
  Administered 2011-10-22: 3 mL via INTRAVENOUS

## 2011-10-22 MED ORDER — CITALOPRAM HYDROBROMIDE 20 MG PO TABS
10.0000 mg | ORAL_TABLET | Freq: Every day | ORAL | Status: DC
  Administered 2011-10-22 – 2011-10-24 (×3): 10 mg via ORAL
  Filled 2011-10-22 (×3): qty 0.5

## 2011-10-22 MED ORDER — ACETAMINOPHEN 650 MG RE SUPP
650.0000 mg | Freq: Four times a day (QID) | RECTAL | Status: DC | PRN
  Filled 2011-10-22: qty 1

## 2011-10-22 MED ORDER — ACETAMINOPHEN 325 MG PO TABS
650.0000 mg | ORAL_TABLET | Freq: Four times a day (QID) | ORAL | Status: DC | PRN
  Administered 2011-10-22 – 2011-10-24 (×3): 650 mg via ORAL
  Filled 2011-10-22 (×4): qty 2

## 2011-10-22 MED ORDER — DIPHENHYDRAMINE HCL 25 MG PO CAPS
25.0000 mg | ORAL_CAPSULE | Freq: Four times a day (QID) | ORAL | Status: DC | PRN
  Filled 2011-10-22: qty 1

## 2011-10-22 MED ORDER — ASPIRIN EC 325 MG PO TBEC
325.0000 mg | DELAYED_RELEASE_TABLET | Freq: Every day | ORAL | Status: DC
  Administered 2011-10-22 – 2011-10-26 (×5): 325 mg via ORAL
  Filled 2011-10-22 (×5): qty 1

## 2011-10-22 MED ORDER — ALBUTEROL SULFATE (5 MG/ML) 0.5% IN NEBU
2.5000 mg | INHALATION_SOLUTION | RESPIRATORY_TRACT | Status: DC | PRN
  Administered 2011-10-22: 2.5 mg via RESPIRATORY_TRACT

## 2011-10-22 MED ORDER — WHITE PETROLATUM GEL
Status: AC
  Administered 2011-10-22: 23:00:00
  Filled 2011-10-22: qty 5

## 2011-10-22 MED ORDER — LEVOFLOXACIN IN D5W 500 MG/100ML IV SOLN
500.0000 mg | INTRAVENOUS | Status: DC
  Administered 2011-10-22: 500 mg via INTRAVENOUS
  Filled 2011-10-22: qty 100

## 2011-10-22 MED ORDER — SODIUM CHLORIDE 0.9 % IV SOLN
INTRAVENOUS | Status: DC
  Administered 2011-10-24: 10:00:00 via INTRAVENOUS

## 2011-10-22 MED ORDER — ONDANSETRON HCL 4 MG/2ML IJ SOLN: 4.0000 mg | Freq: Four times a day (QID) | INTRAMUSCULAR | Status: DC | PRN

## 2011-10-22 MED ORDER — IPRATROPIUM BROMIDE 0.02 % IN SOLN
0.5000 mg | Freq: Four times a day (QID) | RESPIRATORY_TRACT | Status: DC
  Administered 2011-10-22 – 2011-10-25 (×13): 0.5 mg via RESPIRATORY_TRACT
  Filled 2011-10-22 (×25): qty 2.5

## 2011-10-22 MED ORDER — METHYLPREDNISOLONE SODIUM SUCC 125 MG IJ SOLR
60.0000 mg | Freq: Four times a day (QID) | INTRAMUSCULAR | Status: DC
  Administered 2011-10-22: 60 mg via INTRAVENOUS
  Filled 2011-10-22: qty 0.96

## 2011-10-22 MED ORDER — SODIUM CHLORIDE 0.9 % IJ SOLN
3.0000 mL | INTRAMUSCULAR | Status: DC | PRN
  Administered 2011-10-24: 3 mL via INTRAVENOUS

## 2011-10-22 MED ORDER — ENOXAPARIN SODIUM 40 MG/0.4ML ~~LOC~~ SOLN
40.0000 mg | SUBCUTANEOUS | Status: DC
  Administered 2011-10-22 – 2011-10-26 (×5): 40 mg via SUBCUTANEOUS
  Filled 2011-10-22 (×5): qty 0.4

## 2011-10-22 MED ORDER — GUAIFENESIN ER 600 MG PO TB12
600.0000 mg | ORAL_TABLET | Freq: Two times a day (BID) | ORAL | Status: DC
  Administered 2011-10-22 – 2011-10-23 (×3): 600 mg via ORAL
  Filled 2011-10-22 (×4): qty 1

## 2011-10-22 MED ORDER — ALBUTEROL (5 MG/ML) CONTINUOUS INHALATION SOLN
10.0000 mg/h | INHALATION_SOLUTION | Freq: Once | RESPIRATORY_TRACT | Status: AC
  Administered 2011-10-22: 10 mg/h via RESPIRATORY_TRACT
  Filled 2011-10-22: qty 20

## 2011-10-22 MED ORDER — OXYCODONE HCL 5 MG PO TABS
5.0000 mg | ORAL_TABLET | ORAL | Status: DC | PRN
  Administered 2011-10-23 – 2011-10-25 (×3): 5 mg via ORAL
  Filled 2011-10-22 (×3): qty 1

## 2011-10-22 MED ORDER — METHYLPREDNISOLONE SODIUM SUCC 125 MG IJ SOLR
125.0000 mg | Freq: Four times a day (QID) | INTRAMUSCULAR | Status: DC
  Administered 2011-10-22 – 2011-10-23 (×3): 125 mg via INTRAVENOUS
  Filled 2011-10-22 (×7): qty 2

## 2011-10-22 MED ORDER — PANTOPRAZOLE SODIUM 40 MG PO TBEC
40.0000 mg | DELAYED_RELEASE_TABLET | Freq: Every day | ORAL | Status: DC
  Administered 2011-10-22 – 2011-10-25 (×4): 40 mg via ORAL
  Filled 2011-10-22 (×3): qty 1

## 2011-10-22 MED ORDER — LEVOFLOXACIN IN D5W 750 MG/150ML IV SOLN
750.0000 mg | INTRAVENOUS | Status: DC
  Administered 2011-10-23: 750 mg via INTRAVENOUS
  Filled 2011-10-22: qty 150

## 2011-10-22 MED ORDER — ALBUTEROL SULFATE (5 MG/ML) 0.5% IN NEBU
INHALATION_SOLUTION | RESPIRATORY_TRACT | Status: AC
  Filled 2011-10-22: qty 0.5

## 2011-10-22 MED ORDER — ALBUTEROL SULFATE (5 MG/ML) 0.5% IN NEBU
2.5000 mg | INHALATION_SOLUTION | Freq: Four times a day (QID) | RESPIRATORY_TRACT | Status: DC
  Administered 2011-10-22 (×3): 2.5 mg via RESPIRATORY_TRACT
  Administered 2011-10-22: 06:00:00 via RESPIRATORY_TRACT
  Administered 2011-10-23 – 2011-10-25 (×10): 2.5 mg via RESPIRATORY_TRACT
  Filled 2011-10-22 (×25): qty 0.5

## 2011-10-22 MED ORDER — DIPHENHYDRAMINE HCL 25 MG PO TABS: 25.0000 mg | ORAL_TABLET | Freq: Four times a day (QID) | ORAL | Status: DC | PRN

## 2011-10-22 MED ORDER — IOHEXOL 300 MG/ML  SOLN: 100.0000 mL | Freq: Once | INTRAMUSCULAR | Status: AC | PRN

## 2011-10-22 MED ORDER — VANCOMYCIN HCL 1000 MG IV SOLR
750.0000 mg | Freq: Three times a day (TID) | INTRAVENOUS | Status: DC
  Administered 2011-10-22 – 2011-10-23 (×3): 750 mg via INTRAVENOUS
  Filled 2011-10-22 (×4): qty 750

## 2011-10-22 NOTE — Progress Notes (Deleted)
  Copd exacerbation, too early for patient to ambulate, tried to get a bath, wheezing and dyspneic at time of my eval. Wbc 25000. CT chest shows copd, no pe. Continue steroids/O2/bronchodilators/Abx-add vanc given high wbc. Rest of orders per Dr Kirtland Bouchard. Kathie Rhodes. Chevette Fee,MD [phone]

## 2011-10-22 NOTE — Progress Notes (Signed)
Subjective: Young lady with COPD, admitted with worsening cough, wheezing, SOB. requiring steroids/O2. Is short of breath and wheezing at time of my evaluation after taking a bed bath. Admits to a week's history of greenish colored phlegm.  Objective: Vital signs in last 24 hours: Temp:  [98 F (36.7 C)-99 F (37.2 C)] 98.1 F (36.7 C) (11/05 1610) Pulse Rate:  [87-119] 92  (11/05 1610) Resp:  [15-24] 15  (11/05 1610) BP: (123-142)/(75-89) 123/80 mmHg (11/05 1610) SpO2:  [95 %-100 %] 98 % (11/05 1610) FiO2 (%):  [95 %-96 %] 96 % (11/04 2035) Weight:  [74 kg (163 lb 2.3 oz)] 163 lb 2.3 oz (74 kg) (11/05 1191) Weight change:  Last BM Date: 10/21/11  Intake/Output from previous day: 11/04 0701 - 11/05 0700 In: 100 [IV Piggyback:100] Out: -  Intake/Output this shift: Total I/O In: 630 [P.O.:480; IV Piggyback:150] Out: 750 [Urine:750]  Exam- audibly wheezing and dyspneic. No JVD. Completes sentences with some pauses. Reduced air entry bilaterally. No rales or rhonchi. CVS- S1,S2 heard, no murmurs. RRR.  CNS-grossly intact. Extremities no pedal edema.  Lab Results:  Basename 10/22/11 1030 10/21/11 1650  WBC 25.7* 21.8*  HGB 12.3 13.2  HCT 36.2 38.1  PLT 288 284   BMET  Basename 10/22/11 1030 10/21/11 1650  NA -- 141  K -- 4.2  CL -- 101  CO2 -- 29  GLUCOSE -- 91  BUN -- 14  CREATININE 0.54 0.67  CALCIUM -- 10.6*    Studies/Results: Dg Chest 2 View  10/21/2011  *RADIOLOGY REPORT*  Clinical Data: Chest pain with extreme shortness of breath.  CHEST - 2 VIEW  Comparison: 10/19/2010.  Findings: The heart size and mediastinal contours are normal. The lungs are clear. There is no pleural effusion or pneumothorax. No acute osseous findings are identified.  There are postsurgical changes status post lower cervical fusion.  IMPRESSION: Stable examination.  No active cardiopulmonary process.  Original Report Authenticated By: Gerrianne Scale, M.D.   Ct Angio Chest W/cm &/or Wo  Cm  10/22/2011  *RADIOLOGY REPORT*  Clinical Data:  Chest pain, shortness of breath, evaluate for PE  CT ANGIOGRAPHY CHEST WITH CONTRAST  Technique:  Multidetector CT imaging of the chest was performed using the standard protocol during bolus administration of intravenous contrast.  Multiplanar CT image reconstructions including MIPs were obtained to evaluate the vascular anatomy.  Contrast:  100 ml Omnipaque-300 IV  Comparison:  Chest radiographs dated 10/21/2011.  Findings:  No evidence of pulmonary embolism.  Evaluation of the lung parenchyma is constrained by respiratory motion.  Mild centrilobular emphysema.  No suspicious pulmonary nodules. No pleural effusion or pneumothorax.  The visualized thyroid is unremarkable.  Heart is normal in size.  No pericardial effusion.  No suspicious mediastinal, hilar, or axillary lymphadenopathy.  The visualized upper abdomen is unremarkable.  Mild degenerative changes of the visualized thoracolumbar spine.  Review of the MIP images confirms the above findings.  IMPRESSION: No evidence of pulmonary embolism.  Mild centrilobular emphysema.  Original Report Authenticated By: Charline Bills, M.D.    Medications: medication reviewed.  Assessment/Plan: 1. Acute hypercapnic hypoxic respiratory failure secondary to COPD exacerbation- No PE. Increasing WBC concerning ? Just steroids alone vs infection. Add vanc, continue levaquin. Slow down on activity till better. Keep in SDU today. 2. Anxiety/depression- stable. 3. DVT/GI prophylaxis. Patient's condition closely guarded.  LOS: 1 day   Tacara Hadlock 10/22/2011, 4:14 PM

## 2011-10-22 NOTE — ED Notes (Signed)
Pt fed meal now and settled in room [facility].

## 2011-10-22 NOTE — Progress Notes (Signed)
ANTIBIOTIC CONSULT NOTE - INITIAL  Pharmacy Consult for Vancomycin  Indication: COPD exac  Allergies  Allergen Reactions  . Sulfa Antibiotics Nausea And Vomiting  . Sulfonamide Derivatives Nausea And Vomiting    REACTION: n/v    Patient Measurements: Height: 5\' 3"  (160 cm) Weight: 163 lb 2.3 oz (74 kg) IBW/kg (Calculated) : 52.4  Adjusted Body Weight:   Vital Signs: Temp: 98.6 F (37 C) (11/05 1211) Temp src: Oral (11/05 1211) BP: 142/82 mmHg (11/05 1211) Pulse Rate: 91  (11/05 1211) Intake/Output from previous day: 11/04 0701 - 11/05 0700 In: 100 [IV Piggyback:100] Out: -  Intake/Output from this shift: Total I/O In: 480 [P.O.:480] Out: 350 [Urine:350]  Labs:  [name] 10/22/11 1030 10/21/11 1650  WBC 25.7* 21.8*  HGB 12.3 13.2  PLT 288 284  LABCREA -- --  CREATININE 0.54 0.67   Estimated Creatinine Clearance: 82.8 ml/min (by C-G formula based on Cr of 0.54). No results found for this basename: VANCOTROUGH:2,VANCOPEAK:2,VANCORANDOM:2,GENTTROUGH:2,GENTPEAK:2,GENTRANDOM:2,TOBRATROUGH:2,TOBRAPEAK:2,TOBRARND:2,AMIKACINPEAK:2,AMIKACINTROU:2,AMIKACIN:2, in the last 72 hours   Microbiology: Recent Results (from the past 720 hour(s))  MRSA PCR SCREENING     Status: Normal   Collection Time   10/22/11  8:01 AM      Component Value Range Status Comment   MRSA by PCR NEGATIVE  NEGATIVE  Final     Medical History: Past Medical History  Diagnosis Date  . Asthma   . Anxiety   . COPD (chronic obstructive pulmonary disease)   . Angina   . Shortness of breath   . Depression   . Hypertension   . GERD (gastroesophageal reflux disease)   . Headache   . Arthritis   . Peripheral vascular disease     Medications:  See med rec and MAR  Assessment: 50 y/o female with h/o COPD with asthma presents with SOB and productive cough and elevated WBC. Start on IV Vancomycin at respiratory dosing in addition to Levaquin. Normal renal function.  Goal of Therapy:  Vancomycin  trough level 15-20 mcg/ml  Plan:  Vancomycin 750mg  IV q8h. Trough after 3-5 doses  [name], [name] 10/22/2011,2:16 PM

## 2011-10-22 NOTE — Progress Notes (Signed)
*  PRELIMINARY RESULTS* Echocardiogram 2D Echocardiogram has been performed.  Clide Deutscher, RDCS 10/22/2011, 12:57 PM

## 2011-10-22 NOTE — Plan of Care (Signed)
Problem: ICU Phase Progression Outcomes Goal: Flu/PneumoVaccines if indicated Outcome: Completed/Met Date Met:  10/22/11 Pt wants before d/c

## 2011-10-22 NOTE — ED Notes (Signed)
ABG obtained, pt receiving hr long HHN Albuterol 10mg . By resp.  Magnesium Sulfate gtt infusing at 21ml/hr.

## 2011-10-23 DIAGNOSIS — J45909 Unspecified asthma, uncomplicated: Secondary | ICD-10-CM

## 2011-10-23 DIAGNOSIS — J449 Chronic obstructive pulmonary disease, unspecified: Secondary | ICD-10-CM

## 2011-10-23 DIAGNOSIS — R061 Stridor: Secondary | ICD-10-CM

## 2011-10-23 LAB — DRUGS OF ABUSE SCREEN W/O ALC, ROUTINE URINE
Amphetamine Screen, Ur: NEGATIVE
Barbiturate Quant, Ur: NEGATIVE
Benzodiazepines.: NEGATIVE
Cocaine Metabolites: NEGATIVE
Creatinine,U: 46.5 mg/dL
Marijuana Metabolite: NEGATIVE
Methadone: NEGATIVE
Opiate Screen, Urine: NEGATIVE
Phencyclidine (PCP): NEGATIVE
Propoxyphene: NEGATIVE

## 2011-10-23 LAB — BASIC METABOLIC PANEL
BUN: 20 mg/dL (ref 6–23)
CO2: 24 mEq/L (ref 19–32)
Calcium: 10 mg/dL (ref 8.4–10.5)
Chloride: 103 mEq/L (ref 96–112)
Creatinine, Ser: 0.56 mg/dL (ref 0.50–1.10)
GFR calc Af Amer: >90 mL/min (ref >90)
GFR calc non Af Amer: >90 mL/min (ref >90)
Glucose, Bld: 153 mg/dL — ABNORMAL HIGH (ref 70–99)
Potassium: 4.7 mEq/L (ref 3.5–5.1)
Sodium: 138 mEq/L (ref 135–145)

## 2011-10-23 LAB — HEPATIC FUNCTION PANEL
ALT: 17 U/L (ref 0–35)
AST: 15 U/L (ref 0–37)
Albumin: 3.7 g/dL (ref 3.5–5.2)
Alkaline Phosphatase: 91 U/L (ref 39–117)
Bilirubin, Direct: <0.1 mg/dL (ref 0.0–0.3)
Total Bilirubin: 0.3 mg/dL (ref 0.3–1.2)
Total Protein: 7.3 g/dL (ref 6.0–8.3)

## 2011-10-23 LAB — CBC
HCT: 35.7 % — ABNORMAL LOW (ref 36.0–46.0)
Hemoglobin: 12 g/dL (ref 12.0–15.0)
MCH: 30.5 pg (ref 26.0–34.0)
MCHC: 33.6 g/dL (ref 30.0–36.0)
MCV: 90.8 fL (ref 78.0–100.0)
Platelets: 316 10*3/uL (ref 150–400)
RBC: 3.93 MIL/uL (ref 3.87–5.11)
RDW: 13.1 % (ref 11.5–15.5)
WBC: 29.2 10*3/uL — ABNORMAL HIGH (ref 4.0–10.5)

## 2011-10-23 LAB — CARDIAC PANEL(CRET KIN+CKTOT+MB+TROPI)
CK, MB: 3.9 ng/mL (ref 0.3–4.0)
Relative Index: 1.5 (ref 0.0–2.5)
Total CK: 259 U/L — ABNORMAL HIGH (ref 7–177)
Troponin I: <0.3 ng/mL (ref <0.30)

## 2011-10-23 LAB — TSH: TSH: 0.335 u[IU]/mL — ABNORMAL LOW (ref 0.350–4.500)

## 2011-10-23 LAB — T4, FREE: Free T4: 1.09 ng/dL (ref 0.80–1.80)

## 2011-10-23 MED ORDER — INFLUENZA VIRUS VACC SPLIT PF IM SUSP
0.2500 mL | Freq: Once | INTRAMUSCULAR | Status: AC
  Administered 2011-10-23: 0.25 mL via INTRAMUSCULAR
  Filled 2011-10-23 (×2): qty 0.25

## 2011-10-23 MED ORDER — PREDNISONE 20 MG PO TABS
40.0000 mg | ORAL_TABLET | Freq: Every day | ORAL | Status: DC
  Administered 2011-10-24 – 2011-10-26 (×3): 40 mg via ORAL
  Filled 2011-10-23 (×4): qty 2

## 2011-10-23 MED ORDER — LEVOFLOXACIN 500 MG PO TABS
500.0000 mg | ORAL_TABLET | Freq: Every day | ORAL | Status: AC
  Administered 2011-10-24 – 2011-10-25 (×2): 500 mg via ORAL
  Filled 2011-10-23 (×2): qty 1

## 2011-10-23 MED ORDER — LORATADINE 10 MG PO TABS
10.0000 mg | ORAL_TABLET | Freq: Every day | ORAL | Status: DC
  Administered 2011-10-23 – 2011-10-26 (×4): 10 mg via ORAL
  Filled 2011-10-23 (×4): qty 1

## 2011-10-23 MED ORDER — GUAIFENESIN ER 600 MG PO TB12
1200.0000 mg | ORAL_TABLET | Freq: Two times a day (BID) | ORAL | Status: DC
  Administered 2011-10-23 – 2011-10-26 (×7): 1200 mg via ORAL
  Filled 2011-10-23 (×9): qty 2

## 2011-10-23 MED ORDER — PNEUMOCOCCAL VAC POLYVALENT 25 MCG/0.5ML IJ INJ
0.5000 mL | INJECTION | INTRAMUSCULAR | Status: AC
Start: 2011-10-24 — End: 2011-10-25
  Filled 2011-10-23: qty 0.5

## 2011-10-23 NOTE — Progress Notes (Signed)
Patient name: [name] Medical record number: [id number] Date of birth: [birth year] Age: 50 y.o. Gender: female PCP: [facility]  Date: 10/23/2011 Reason for Consult: asthma/COPD exacerbation Referring Physician: Toniann Fail  HPI: 50 yo former smoker with hx HTN, migraine HA, asthma dx in 2000 after a hospitalization for dyspnea. No asthma as a child. Originally seen by me in 2010. 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 Has received pred bursts every few years. Maintenance meds Advair 500/50, uses combivent prn.  Presented to ED 11/4 with 1 week dyspnea, wheeze and chest pressure. She has had increased cough and sputum production beyond baseline, yellowish phlegm. Admitted with acute exacerbation asthma. CXR clear.   Asthma History: Known Triggers: smoke, grass, hot weather  Initial Asthma Severity Rating:  Age range: 12+ years  Symptoms: >2 days/week; not daily  Nighttime Awakenings: >1/week but not nightly  Interferes w/ normal activity: no limitations  SABA use (not for EIB): 0-2 days/week  Exacerbations requiring oral systemic steroids: 0-1/year  Asthma Severity Assessment: Moderate Persistent   Past Medical History  Diagnosis Date  . Asthma   . Anxiety   . COPD (chronic obstructive pulmonary disease)   . Angina   . Depression   . Hypertension   . GERD (gastroesophageal reflux disease)   . Headache   . Arthritis   . Peripheral vascular disease    Past Surgical History  Procedure Date  . Neck surgery   . Toe surgery   . Abdominal hysterectomy   . Tubal ligation   . Kidney stone surgery   . Hemorrhoid surgery   . Incise and drain abcess   . Back surgery   . Diagnostic laparoscopy    Family History  Problem Relation Age of Onset  . Diabetes Mother   . COPD Mother   . Heart disease Mother   . Diabetes Father   . Kidney disease Father   . Cancer Father   . Anesthesia problems Father   . Diabetes Sister   . Hypertension  Sister   . Diabetes Brother   . Sleep apnea Brother   . Heart disease Sister   . Diabetes Sister   . Heart disease Brother    Social History:  25 pk-yrs tobacco. Had quit in May 2010 but restarted. She reports that she quit smoking again about 6 months ago. She does not have any smokeless tobacco history on file. She reports that she uses illicit drugs (Hashish). She reports that she does not drink alcohol. Since I last saw her in office she has been homeless and has depended on friends for a place to stay. She has only intermittently been able to obtain her Advair. She tells me that her smoke exposure especially secondhand smoke has increased. Obtain medications has been difficult due to cost. She goes to [facility].   Allergies:  Allergies  Allergen Reactions  . Sulfa Antibiotics Nausea And Vomiting  . Sulfonamide Derivatives Nausea And Vomiting    REACTION: n/v   Prior to Admission medications   Medication Sig Start Date End Date Taking? Authorizing Provider  albuterol (PROVENTIL) (5 MG/ML) 0.5% nebulizer solution Take 2.5 mg by nebulization at bedtime as needed. As needed for shortness of breath    Yes Historical Provider, MD  Aspirin-Salicylamide-Caffeine (BC HEADACHE POWDER PO) Take 1 packet by mouth every 4 (four) hours as needed. As needed for headache    Yes Historical Provider, MD  citalopram (CELEXA) 10 MG tablet Take 10 mg by  mouth daily.     Yes Historical Provider, MD  diphenhydrAMINE (BENADRYL) 25 MG tablet Take 25 mg by mouth every 6 (six) hours as needed. As needed for sleep and/or itching    Yes Historical Provider, MD  Fluticasone-Salmeterol (ADVAIR DISKUS) 500-50 MCG/DOSE AEPB Inhale 1 puff into the lungs daily.    Yes Historical Provider, MD  Ipratropium-Albuterol (COMBIVENT IN) Inhale 2 puffs into the lungs every 4 (four) hours as needed. As needed for shortness of breath    Yes Historical Provider, MD  aspirin-acetaminophen-caffeine (EXCEDRIN MIGRAINE) [phone] MG  per tablet Take 3 tablets by mouth every 6 (six) hours as needed. As needed for headache     Historical Provider, MD   ROS:  Pertinent items are noted in HPI.  Temp:  [97.9 F (36.6 C)-98.7 F (37.1 C)] 98.6 F (37 C) (11/06 0800) Pulse Rate:  [75-99] 89  (11/06 0830) Resp:  [14-21] 21  (11/06 0830) BP: (117-150)/(65-90) 143/90 mmHg (11/06 0800) SpO2:  [92 %-100 %] 100 % (11/06 0830)  Intake/Output Summary (Last 24 hours) at 10/23/11 1052 Last data filed at 10/23/11 0700  Gross per 24 hour  Intake   2370 ml  Output   1700 ml  Net    670 ml    Physical exam Gen: Pleasant, overweight, in no distress on room air,  normal affect  ENT: No lesions,  mouth clear,  oropharynx clear, no postnasal drip. She has significant upper airway noise and expiratory stridor. Her voice is hoarse.  Neck: No JVD, no TMG, no carotid bruits  Lungs: No use of accessory muscles, very distant breath sounds. She likely has some mild expiratory wheezes these are difficult to distinguish from her very prominent upper airway noise  Cardiovascular: RRR, heart sounds normal, no murmur or gallops, no peripheral edema  Abdomen: soft and NT, no HSM,  BS normal  Musculoskeletal: No deformities, no cyanosis or clubbing  Neuro: alert, non focal  Skin: Warm, no lesions or rashes   LAB RESULTS BMET    Component Value Date/Time   NA 138 10/23/2011 0420   K 4.7 10/23/2011 0420   CL 103 10/23/2011 0420   CO2 24 10/23/2011 0420   GLUCOSE 153* 10/23/2011 0420   BUN 20 10/23/2011 0420   CREATININE 0.56 10/23/2011 0420   CALCIUM 10.0 10/23/2011 0420   GFRNONAA >90 10/23/2011 0420   GFRAA >90 10/23/2011 0420   CBC    Component Value Date/Time   WBC 29.2* 10/23/2011 0420   RBC 3.93 10/23/2011 0420   HGB 12.0 10/23/2011 0420   HCT 35.7* 10/23/2011 0420   PLT 316 10/23/2011 0420   MCV 90.8 10/23/2011 0420   MCH 30.5 10/23/2011 0420   MCHC 33.6 10/23/2011 0420   RDW 13.1 10/23/2011 0420   LYMPHSABS 3.4 09/03/2011 0857    MONOABS 0.5 09/03/2011 0857   EOSABS 0.1 09/03/2011 0857   BASOSABS 0.0 09/03/2011 0857   ABG    Component Value Date/Time   PHART 7.399 10/22/2011 0045   HCO3 26.6* 10/22/2011 0045   TCO2 28 10/22/2011 0045   O2SAT 99.0 10/22/2011 0045   Radiology CT scan chest 10/22/11: No evidence of pulmonary embolism.  Mild centrilobular emphysema.  CXR 10/21/11: Findings: The heart size and mediastinal contours are normal. The  lungs are clear. There is no pleural effusion or pneumothorax. No  acute osseous findings are identified. There are postsurgical  changes status post lower cervical fusion.  IMPRESSION:  Stable examination. No active cardiopulmonary process.  Assessment and plan: Patient Active Hospital Problem List: ASTHMA (05/12/2009)   Assessment: Acute asthma exacerbation that appears to be precipitated by an upper respiratory infection. Her overall control has been impacted by the fact that she doesn't have a stable home situation, is unable to get her medications reliably  - continue albuterol and Atrovent scheduled nebulizers for now. We'll attempt to change her back to her usual Advair 500/50 once her upper airway irritation and stridor have improved.  - discontinue Pulmicort nebulizer  - Change Solu-Medrol to prednisone and plan taper over approximately 2 weeks  - CT scan of the chest does not show any evidence of pneumonia, we can discontinue vancomycin and change Levaquin to by mouth. She needs to finish 5 days total.  - It will be important for Ms. Yum to have improved outpatient support and more consistent health care in order to keep her asthma under good control. She  Will need case management and social work support in order to guarantee that she has good access to meds and health care.  Stridor (10/23/2011)   Assessment: Suspect this is a significant component of her current wheezing. She complains of recent URI symptoms which is likely exacerbating her upper airway. She  may also have components of allergic rhinitis and or GERD.  - agree with Protonix  - Add loratadine 10 mg daily  Oscar Forman S.

## 2011-10-23 NOTE — Progress Notes (Signed)
Subjective: Pulmonary appreciated. Patient quite dyspneic at the time of my evaluation. Still audibly wheezing. She say she just came from the bathroom. She does not have her oxygen on.  Objective: Vital signs in last 24 hours: Temp:  [97.9 F (36.6 C)-98.7 F (37.1 C)] 98.6 F (37 C) (11/06 0800) Pulse Rate:  [75-99] 89  (11/06 0830) Resp:  [14-21] 21  (11/06 0830) BP: (117-150)/(65-90) 143/90 mmHg (11/06 0800) SpO2:  [92 %-100 %] 100 % (11/06 0830) Weight change:  Last BM Date: 10/21/11  Intake/Output from previous day: 11/05 0701 - 11/06 0700 In: 2650 [P.O.:1200; I.V.:850; IV Piggyback:600] Out: 1700 [Urine:1700] Intake/Output this shift:    Exam- audibly wheezing and dyspneic. No JVD. Completes sentences with some pauses. Reduced air entry bilaterally. No rales or rhonchi. CVS- S1,S2 heard, no murmurs. RRR. CNS-grossly intact. Extremities no pedal edema.      Lab Results:  Basename 10/23/11 0420 10/22/11 1030  WBC 29.2* 25.7*  HGB 12.0 12.3  HCT 35.7* 36.2  PLT 316 288   BMET  Basename 10/23/11 0420 10/22/11 1030 10/21/11 1650  NA 138 -- 141  K 4.7 -- 4.2  CL 103 -- 101  CO2 24 -- 29  GLUCOSE 153* -- 91  BUN 20 -- 14  CREATININE 0.56 0.54 --  CALCIUM 10.0 -- 10.6*    Studies/Results: Dg Chest 2 View  10/21/2011  *RADIOLOGY REPORT*  Clinical Data: Chest pain with extreme shortness of breath.  CHEST - 2 VIEW  Comparison: 10/19/2010.  Findings: The heart size and mediastinal contours are normal. The lungs are clear. There is no pleural effusion or pneumothorax. No acute osseous findings are identified.  There are postsurgical changes status post lower cervical fusion.  IMPRESSION: Stable examination.  No active cardiopulmonary process.  Original Report Authenticated By: Gerrianne Scale, M.D.   Ct Angio Chest W/cm &/or Wo Cm  10/22/2011  *RADIOLOGY REPORT*  Clinical Data:  Chest pain, shortness of breath, evaluate for PE  CT ANGIOGRAPHY CHEST WITH CONTRAST   Technique:  Multidetector CT imaging of the chest was performed using the standard protocol during bolus administration of intravenous contrast.  Multiplanar CT image reconstructions including MIPs were obtained to evaluate the vascular anatomy.  Contrast:  100 ml Omnipaque-300 IV  Comparison:  Chest radiographs dated 10/21/2011.  Findings:  No evidence of pulmonary embolism.  Evaluation of the lung parenchyma is constrained by respiratory motion.  Mild centrilobular emphysema.  No suspicious pulmonary nodules. No pleural effusion or pneumothorax.  The visualized thyroid is unremarkable.  Heart is normal in size.  No pericardial effusion.  No suspicious mediastinal, hilar, or axillary lymphadenopathy.  The visualized upper abdomen is unremarkable.  Mild degenerative changes of the visualized thoracolumbar spine.  Review of the MIP images confirms the above findings.  IMPRESSION: No evidence of pulmonary embolism.  Mild centrilobular emphysema.  Original Report Authenticated By: Charline Bills, M.D.    Medications: medications reviewed  Assessment/Plan: 1. Acute hypercapnic hypoxic respiratory failure secondary to COPD exacerbation- Pulmonary assistance greatly appreciated. Patient still wheezing. Worsening WBC hopefully steroid induced de margination. Will keep in SUD till more stable respiratory wise. Follow pulmonary recommendations. 2. Anxiety/depression- stable.  3. DVT/GI prophylaxis.  Patient's condition remains closely guarded.   LOS: 2 days   Marchell Froman 10/23/2011, 11:28 AM

## 2011-10-24 LAB — COMPREHENSIVE METABOLIC PANEL
ALT: 27 U/L (ref 0–35)
AST: 22 U/L (ref 0–37)
Albumin: 3.4 g/dL — ABNORMAL LOW (ref 3.5–5.2)
Alkaline Phosphatase: 80 U/L (ref 39–117)
BUN: 22 mg/dL (ref 6–23)
CO2: 26 mEq/L (ref 19–32)
Calcium: 9.5 mg/dL (ref 8.4–10.5)
Chloride: 102 mEq/L (ref 96–112)
Creatinine, Ser: 0.72 mg/dL (ref 0.50–1.10)
GFR calc Af Amer: >90 mL/min (ref >90)
GFR calc non Af Amer: >90 mL/min (ref >90)
Glucose, Bld: 88 mg/dL (ref 70–99)
Potassium: 3.8 mEq/L (ref 3.5–5.1)
Sodium: 136 mEq/L (ref 135–145)
Total Bilirubin: 0.3 mg/dL (ref 0.3–1.2)
Total Protein: 6.5 g/dL (ref 6.0–8.3)

## 2011-10-24 LAB — CBC
HCT: 34.4 % — ABNORMAL LOW (ref 36.0–46.0)
Hemoglobin: 11.7 g/dL — ABNORMAL LOW (ref 12.0–15.0)
MCH: 30.8 pg (ref 26.0–34.0)
MCHC: 34 g/dL (ref 30.0–36.0)
MCV: 90.5 fL (ref 78.0–100.0)
Platelets: 297 10*3/uL (ref 150–400)
RBC: 3.8 MIL/uL — ABNORMAL LOW (ref 3.87–5.11)
RDW: 13.1 % (ref 11.5–15.5)
WBC: 28.7 10*3/uL — ABNORMAL HIGH (ref 4.0–10.5)

## 2011-10-24 MED ORDER — CITALOPRAM HYDROBROMIDE 20 MG PO TABS
10.0000 mg | ORAL_TABLET | Freq: Every day | ORAL | Status: DC
  Administered 2011-10-25 – 2011-10-26 (×2): 10 mg via ORAL
  Filled 2011-10-24 (×2): qty 0.5

## 2011-10-24 NOTE — Progress Notes (Signed)
[name] is an 50 y.o. female. Reason for Consult: asthma/COPD exacerbation  Referring Physician: Toniann Fail   HPI: 50 yo former smoker with hx HTN, migraine HA, asthma dx in 2000 after a hospitalization for dyspnea. No asthma as a child. Originally seen by me in 2010. 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 Has received pred bursts every few years. Maintenance meds Advair 500/50, uses combivent prn. Presented to ED 11/4 with 1 week dyspnea, wheeze and chest pressure. She has had increased cough and sputum production beyond baseline, yellowish phlegm. Admitted with acute exacerbation asthma. CXR clear.   Asthma History:  Known Triggers: smoke, grass, hot weather  Initial Asthma Severity Rating:  Age range: 12+ years  Symptoms: >2 days/week; not daily  Nighttime Awakenings: >1/week but not nightly  Interferes w/ normal activity: no limitations  SABA use (not for EIB): 0-2 days/week  Exacerbations requiring oral systemic steroids: 0-1/year  Asthma Severity Assessment: Moderate Persistent   Microbiology/Sepsis markers: Blood cx 11/5 >>  MRSA PCR 11/5 >> negative  Anti-infectives:  vanco 11/5 >> 11/6 levoflox 11/4 >>   Subjective Patient continues to be quite dyspneic particularly with activity or with frequent talking.on evaluation by Dr. Venetia Constable this morning she had significant dyspnea, stridor, wheeze after walking to the bathroom. Of note she did not wear her oxygen while ambulating. She tells me that she has improved since admission but continues to be significantly more short of breath than her usual baseline. Note that Solu-Medrol was changed to prednisone yesterday and that vancomycin was discontinued.  Vitals Temp:  [98.2 F (36.8 C)-98.7 F (37.1 C)] 98.5 F (36.9 C) (11/07 0800) Pulse Rate:  [63-95] 63  (11/07 0800) Resp:  [12-17] 13  (11/07 0800) BP: (121-153)/(64-89) 144/86 mmHg (11/07 0800) SpO2:  [95 %-100 %] 97 % (11/07  0859)   Intake/Output Summary (Last 24 hours) at 10/24/11 1030 Last data filed at 10/24/11 0659  Gross per 24 hour  Intake 2708.17 ml  Output    350 ml  Net 2358.17 ml    EXAM Gen: Pleasant, overweight, in no distress on room air, normal affect  ENT: No lesions, mouth clear, oropharynx clear, no postnasal drip. She has significant upper airway noise and expiratory stridor. Her voice is less hoarse than in 11/6.  Neck: No JVD, no TMG, no carotid bruits  Lungs: No use of accessory muscles, very distant breath sounds. She likely has some mild expiratory wheezes these are difficult to distinguish from her very prominent upper airway noise  Cardiovascular: RRR, heart sounds normal, no murmur or gallops, no peripheral edema  Abdomen: soft and NT, no HSM, BS normal  Musculoskeletal: No deformities, no cyanosis or clubbing  Neuro: alert, non focal  Skin: Warm, no lesions or rashes   LAB RESULTS Lab Results  Component Value Date   NA 136 10/24/2011   K 3.8 10/24/2011   CL 102 10/24/2011   CO2 26 10/24/2011   Lab Results  Component Value Date   WBC 28.7* 10/24/2011   HGB 11.7* 10/24/2011   HCT 34.4* 10/24/2011   MCV 90.5 10/24/2011   PLT 297 10/24/2011   ABG    Component Value Date/Time   PHART 7.399 10/22/2011 0045   HCO3 26.6* 10/22/2011 0045   TCO2 28 10/22/2011 0045   O2SAT 99.0 10/22/2011 0045   PCXR Impression/Plan ASTHMA (05/12/2009) Assessment: Acute asthma exacerbation precipitated by an upper respiratory infection. Her overall control has been impacted by the fact that she  doesn't have a stable home situation, is unable to get her medications reliably  - continue albuterol and Atrovent scheduled nebulizers for now. We'll attempt to change her back to her usual Advair 500/50 once her upper airway irritation and stridor have improved.  - discontinue Pulmicort nebulizer - prednisone taper over approximately 2 weeks  - CT scan of the chest did not show any evidence of pneumonia,  plan finish Levaquin 5 days total - It will be important for Ms. Baehr to have improved outpatient support and more consistent health care in order to keep her asthma under good control. She Will need case management and social work support in order to guarantee that she has good access to meds and health care.  - she appears to be stable for transfer from the step down unit to a regular floor bed at this time  Stridor (10/23/2011) Assessment: Suspect this is a significant component of her current wheezing. She complains of recent URI symptoms which is likely exacerbating her upper airway. She may also have components of allergic rhinitis and or GERD. - agree with Protonix  - Add loratadine 10 mg daily  Leukocytosis (10/23/2011)   Assessment: suspect due to demargination from steroid - follow CBC - Follow clinical status for any signs of active infection  I have discussed the case with Dr Venetia Constable.  Niraj Kudrna S. 10/24/2011

## 2011-10-24 NOTE — Progress Notes (Signed)
Clinical Social Worker completed psychosocial assessment, please see in pt shadow chart. CSW informed RN Case manger of pt need for medication assistance and relationship with [facility] as her primary care physician. Pt given resources for [facility] as another resource for case management and medical assistance with the [facility]. No Further CSW needs. Signing off.   Catha Gosselin LCSWA  [phone]  11:50am

## 2011-10-24 NOTE — Progress Notes (Signed)
Subjective: 50 year old lady with COPD, admitted on with worsening cough, wheezing, SOB for 1 week. Patient found to have acute exacerbation of COPD. Had CT chest as there was concern for PE(was negative). She was seen by Dr Delton Coombes, who has seen her in the office previously, his input greatly appreciated. Patient has done well with bronchodilators, systemic steroids, O2 and antibiotics. 2D Echo unrevealing. She has continued to have wheezing but this is felt by pulmonary to be mostly upper. She feels better today. If she continues to do well, may be able to d/c in a day or 2. She needs assistance with her social situation and Baxter Hire and Victorino Dike are diligently helping her explore the possibilities.  Objective: Vital signs in last 24 hours: Temp:  [98.2 F (36.8 C)-98.7 F (37.1 C)] 98.5 F (36.9 C) (11/07 0800) Pulse Rate:  [63-89] 63  (11/07 0800) Resp:  [12-17] 13  (11/07 0800) BP: (121-153)/(64-89) 144/86 mmHg (11/07 0800) SpO2:  [95 %-100 %] 97 % (11/07 0859) Weight change:  Last BM Date: 10/21/11  Intake/Output from previous day: 11/06 0701 - 11/07 0700 In: 3458.2 [P.O.:1660; I.V.:1798.2] Out: 350 [Urine:350] Intake/Output this shift:    More comfortable at rest. RS-less wheezing. Improved air entry. No rhonchi. CVS- S1,S2 heard, No murmurs. RRR. CNS-grossly intact. Abdomen-benign. Extremities- No pedal edema.  Lab Results:  [facility] 10/24/11 0558 10/23/11 0420  WBC 28.7* 29.2*  HGB 11.7* 12.0  HCT 34.4* 35.7*  PLT 297 316   BMET  Basename 10/24/11 0558 10/23/11 0420  NA 136 138  K 3.8 4.7  CL 102 103  CO2 26 24  GLUCOSE 88 153*  BUN 22 20  CREATININE 0.72 0.56  CALCIUM 9.5 10.0      Medications: medications reviewed.  Assessment/Plan: 1. Acute hypercapnic hypoxic respiratory failure secondary to COPD exacerbation- Pulmonary assistance greatly appreciated. Clinically better today. Very minimal wheezing. WBC heading the right direction. Looks more like steroid  induced.. Agree with transfer out of SDU today. Ambulate. Not requiring O2 at rest. Needs better health care maintenance outpatient. 2. Anxiety/depression- stable.  3. DVT/GI prophylaxis.  Disposition- home soon? Tomorrow if continues to do well.   LOS: 3 days   Jamesmichael Shadd 10/24/2011, 12:40 PM

## 2011-10-25 DIAGNOSIS — J45909 Unspecified asthma, uncomplicated: Secondary | ICD-10-CM

## 2011-10-25 DIAGNOSIS — R061 Stridor: Secondary | ICD-10-CM

## 2011-10-25 DIAGNOSIS — J449 Chronic obstructive pulmonary disease, unspecified: Secondary | ICD-10-CM

## 2011-10-25 MED ORDER — SENNOSIDES-DOCUSATE SODIUM 8.6-50 MG PO TABS
1.0000 | ORAL_TABLET | Freq: Once | ORAL | Status: AC
  Administered 2011-10-25: 1 via ORAL
  Filled 2011-10-25: qty 1

## 2011-10-25 MED ORDER — ALBUTEROL SULFATE (5 MG/ML) 0.5% IN NEBU
2.5000 mg | INHALATION_SOLUTION | RESPIRATORY_TRACT | Status: DC | PRN
  Administered 2011-10-25: 2.5 mg via RESPIRATORY_TRACT
  Filled 2011-10-25: qty 0.5

## 2011-10-25 MED ORDER — MORPHINE SULFATE 4 MG/ML IJ SOLN: 1.0000 mg | INTRAMUSCULAR | Status: DC | PRN

## 2011-10-25 MED ORDER — FLUTICASONE-SALMETEROL 500-50 MCG/DOSE IN AEPB
1.0000 | INHALATION_SPRAY | Freq: Two times a day (BID) | RESPIRATORY_TRACT | Status: DC
  Administered 2011-10-25 – 2011-10-26 (×4): 1 via RESPIRATORY_TRACT
  Filled 2011-10-25: qty 14

## 2011-10-25 NOTE — Progress Notes (Signed)
Subjective: No events overnight. Pt reports improvement in shortness of breath and denies chest pain at the time. Reports chronic lower back pain not very well controlled on current medication regimen.   Objective:  Vital signs in last 24 hours:  Filed Vitals:   10/24/11 2130 10/25/11 0229 10/25/11 0501 10/25/11 0831  BP:   137/74   Pulse:   80   Temp:   98.4 F (36.9 C)   TempSrc:   Oral   Resp:   17   Height:      Weight:      SpO2: 92% 97% 94% 95%    Intake/Output from previous day:   Intake/Output Summary (Last 24 hours) at 10/25/11 1332 Last data filed at 10/24/11 2200  Gross per 24 hour  Intake    120 ml  Output      0 ml  Net    120 ml    Physical Exam: General: Alert, awake, oriented x3, in no acute distress. HEENT: No bruits, no goiter. Heart: Regular rate and rhythm, without murmurs, rubs, gallops. Lungs: Distant breath sounds. Mild expiratory wheezing. Abdomen: Soft, nontender, nondistended, positive bowel sounds. Extremities: No clubbing cyanosis or edema with positive pedal pulses. Neuro: Grossly intact, nonfocal.   Lab Results:  Basic Metabolic Panel:    Component Value Date/Time   NA 136 10/24/2011 0558   K 3.8 10/24/2011 0558   CL 102 10/24/2011 0558   CO2 26 10/24/2011 0558   BUN 22 10/24/2011 0558   CREATININE 0.72 10/24/2011 0558   GLUCOSE 88 10/24/2011 0558   CALCIUM 9.5 10/24/2011 0558   CBC:    Component Value Date/Time   WBC 28.7* 10/24/2011 0558   HGB 11.7* 10/24/2011 0558   HCT 34.4* 10/24/2011 0558   PLT 297 10/24/2011 0558   MCV 90.5 10/24/2011 0558   NEUTROABS 5.5 09/03/2011 0857   LYMPHSABS 3.4 09/03/2011 0857   MONOABS 0.5 09/03/2011 0857   EOSABS 0.1 09/03/2011 0857   BASOSABS 0.0 09/03/2011 0857    Recent Results (from the past 240 hour(s))  MRSA PCR SCREENING     Status: Normal   Collection Time   10/22/11  8:01 AM      Component Value Range Status Comment   MRSA by PCR NEGATIVE  NEGATIVE  Final   CULTURE, BLOOD (ROUTINE X 2)      Status: Normal (Preliminary result)   Collection Time   10/22/11 10:00 AM      Component Value Range Status Comment   Specimen Description BLOOD RIGHT ARM   Final    Special Requests BOTTLES DRAWN AEROBIC AND ANAEROBIC 10CC   Final    Setup Time [id number]   Final    Culture     Final    Value:        BLOOD CULTURE RECEIVED NO GROWTH TO DATE CULTURE WILL BE HELD FOR 5 DAYS BEFORE ISSUING A FINAL NEGATIVE REPORT   Report Status PENDING   Incomplete   CULTURE, BLOOD (ROUTINE X 2)     Status: Normal (Preliminary result)   Collection Time   10/22/11 10:30 AM      Component Value Range Status Comment   Specimen Description BLOOD RIGHT HAND   Final    Special Requests BOTTLES DRAWN AEROBIC AND ANAEROBIC 10CC   Final    Setup Time [id number]   Final    Culture     Final    Value:        BLOOD CULTURE RECEIVED NO  GROWTH TO DATE CULTURE WILL BE HELD FOR 5 DAYS BEFORE ISSUING A FINAL NEGATIVE REPORT   Report Status PENDING   Incomplete     Studies/Results: No results found.  Medications: Scheduled Meds:   . aspirin EC  325 mg Oral Daily  . citalopram  10 mg Oral Daily  . enoxaparin  40 mg Subcutaneous Q24H  . Fluticasone-Salmeterol  1 puff Inhalation BID  . guaiFENesin  1,200 mg Oral BID  . levofloxacin  500 mg Oral Daily  . loratadine  10 mg Oral Daily  . pantoprazole  40 mg Oral Q1200  . pneumococcal 23 valent vaccine  0.5 mL Intramuscular Tomorrow-1000  . predniSONE  40 mg Oral QAC breakfast  . DISCONTD: albuterol  2.5 mg Nebulization Q6H  . DISCONTD: citalopram  10 mg Oral Daily  . DISCONTD: ipratropium  0.5 mg Nebulization Q6H   Continuous Infusions:  PRN Meds:.acetaminophen, acetaminophen, albuterol, bisacodyl, diphenhydrAMINE, ondansetron (ZOFRAN) IV, ondansetron, oxyCODONE, sodium chloride, DISCONTD: albuterol  Assessment/Plan:  Active Problems:  ASTHMA/COPD exacerbation - clinically improving, appreciate PCCM input, will schedule an appointment for the pt in  outpatient setting with pulmonologist once pt is discharge, continue antibiotics and advair  Leukocytosis - secondary to steroids, will obtain cbc in AM  HYPERTENSION - well controlled     LOS: 4 days   [name] 10/25/2011, 1:32 PM

## 2011-10-25 NOTE — ED Provider Notes (Signed)
Medical screening examination/treatment/procedure(s) were conducted as a shared visit with non-physician practitioner(s) and myself.  I personally evaluated the patient during the encounter   Matthias Bogus A. Patrica Duel, MD 10/25/11 1610

## 2011-10-25 NOTE — Progress Notes (Signed)
Pulmonary F/U Note  KORTNY LIRETTE is an 50 y.o. female. Reason for Consult: asthma/COPD exacerbation  Referring Physician: Toniann Fail   HPI: 50 yo former smoker with hx HTN, migraine HA, asthma dx in 2000 after a hospitalization for dyspnea. No asthma as a child. Originally seen by me in 2010. 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 Has received pred bursts every few years. Maintenance meds Advair 500/50, uses combivent prn. Presented to ED 11/4 with 1 week dyspnea, wheeze and chest pressure. She has had increased cough and sputum production beyond baseline, yellowish phlegm. Admitted with acute exacerbation asthma. CXR clear.   Asthma History (at baseline):  Known Triggers: smoke, grass, hot weather  Initial Asthma Severity Rating:  Age range: 12+ years  Symptoms: >2 days/week; not daily  Nighttime Awakenings: >1/week but not nightly  Interferes w/ normal activity: no limitations  SABA use (not for EIB): 0-2 days/week  Exacerbations requiring oral systemic steroids: 0-1/year  Asthma Severity Assessment: Moderate Persistent   Microbiology/Sepsis markers: Blood cx 11/5 >>  MRSA PCR 11/5 >> negative  Anti-infectives:  vanco 11/5 >> 11/6 levoflox 11/4 >> 11/8  Subjective Patient improved today. Still has wheeze and stridor but less pronounced. Breathing closer to baseline. Voice stronger.   Vitals Temp:  [98.2 F (36.8 C)-98.6 F (37 C)] 98.4 F (36.9 C) (11/08 0501) Pulse Rate:  [73-80] 80  (11/08 0501) Resp:  [16-17] 17  (11/08 0501) BP: (122-145)/(74-86) 137/74 mmHg (11/08 0501) SpO2:  [92 %-97 %] 95 % (11/08 0831) Weight:  [74.6 kg (164 lb 7.4 oz)] 164 lb 7.4 oz (74.6 kg) (11/07 1538)   Intake/Output Summary (Last 24 hours) at 10/25/11 0959 Last data filed at 10/24/11 2200  Gross per 24 hour  Intake 420.83 ml  Output      0 ml  Net 420.83 ml   EXAM Gen: Pleasant, overweight, in no distress on room air, normal affect  ENT: No  lesions, mouth clear, oropharynx clear, no postnasal drip. Still w stridor but improved. Her voice is less hoarse than in 11/6, 11/7.  Neck: No JVD, no TMG, no carotid bruits  Lungs: No use of accessory muscles, very distant breath sounds. Bilateral low pitched wheezes on expiration, improved but still prominent Cardiovascular: RRR, heart sounds normal, no murmur or gallops, no peripheral edema  Abdomen: soft and NT, no HSM, BS normal  Musculoskeletal: No deformities, no cyanosis or clubbing  Neuro: alert, non focal  Skin: Warm, no lesions or rashes  LAB RESULTS No results found for this or any previous visit (from the past 24 hour(s)).  PCXR No results found.  Impression/Plan ASTHMA (05/12/2009) Assessment: Acute asthma exacerbation precipitated by an upper respiratory infection. Her overall control has been impacted by the fact that she doesn't have a stable home situation, is unable to get her medications reliably  - Change her back to her usual Advair 500/50 today, follow UA status and stridor to insure that she tolerates. Reviewed w her that she will need to rinse out mouth after using.  - prednisone taper over approximately 2 weeks  - CT scan of the chest did not show any evidence of pneumonia, last day Levaquin (5 days total) is today 11/8 - It will be important for Ms. Hannay to have improved outpatient support and more consistent health care in order to keep her asthma under good control. Appreciate case management and social work support input.  I believe she can get most of her care  at [facility] as long as she sees them regularly. I would be happy to follow her at [facility], but cost of multiple MD's is an issue. She should be able to get meds from [employer], may qualify for financial assistance from the pharmaceutical company  - prob d/c to home 1-2 days if Advair tolerated.   Stridor (10/23/2011) Assessment: Has been a large contributor to her wheezing.  Recent URI symptoms likely exacerbated her upper airway. She may also have components of allergic rhinitis and or GERD. - agree with Protonix, would change to omeprazole generic at d/c home - continue loratadine 10 mg daily  Leukocytosis (10/23/2011)   Assessment: suspect due to demargination from steroids - follow CBC - Follow clinical status for any signs of active infection  Kaheem Halleck S. 10/25/2011

## 2011-10-26 LAB — CBC
HCT: 40.1 % (ref 36.0–46.0)
Hemoglobin: 13.7 g/dL (ref 12.0–15.0)
MCH: 30.7 pg (ref 26.0–34.0)
MCHC: 34.2 g/dL (ref 30.0–36.0)
MCV: 89.9 fL (ref 78.0–100.0)
Platelets: 356 10*3/uL (ref 150–400)
RBC: 4.46 MIL/uL (ref 3.87–5.11)
RDW: 12.7 % (ref 11.5–15.5)
WBC: 26 10*3/uL — ABNORMAL HIGH (ref 4.0–10.5)

## 2011-10-26 LAB — BASIC METABOLIC PANEL
BUN: 18 mg/dL (ref 6–23)
CO2: 27 mEq/L (ref 19–32)
Calcium: 9.9 mg/dL (ref 8.4–10.5)
Chloride: 99 mEq/L (ref 96–112)
Creatinine, Ser: 0.72 mg/dL (ref 0.50–1.10)
GFR calc Af Amer: >90 mL/min (ref >90)
GFR calc non Af Amer: >90 mL/min (ref >90)
Glucose, Bld: 85 mg/dL (ref 70–99)
Potassium: 3.8 mEq/L (ref 3.5–5.1)
Sodium: 140 mEq/L (ref 135–145)

## 2011-10-26 MED ORDER — OXYCODONE HCL 5 MG PO TABS: 5.0000 mg | ORAL_TABLET | ORAL | Status: AC | PRN

## 2011-10-26 MED ORDER — PREDNISONE 20 MG PO TABS: 10.0000 mg | ORAL_TABLET | Freq: Every day | ORAL | Status: AC

## 2011-10-26 MED ORDER — ASPIRIN 325 MG PO TBEC: 325.0000 mg | DELAYED_RELEASE_TABLET | Freq: Every day | ORAL | Status: AC

## 2011-10-26 NOTE — Discharge Summary (Signed)
Patient ID: [name] MRN: [id number] DOB/AGE: 06/01/1961 50 y.o.  Admit date: 10/21/2011 Discharge date: 10/26/2011  Primary Care Physician:  [facility]. Pulmonologist:  Dr. Robert Byrum  Discharge Diagnoses: Present on Admission:  .ASTHMA .Stridor .Leukocytosis .HYPERTENSION .COPD   Current Discharge Medication List    START taking these medications   Details  aspirin EC 325 MG EC tablet Take 1 tablet (325 mg total) by mouth daily. Qty: 30 tablet, Refills: 3    oxyCODONE (OXY IR/ROXICODONE) 5 MG immediate release tablet Take 1 tablet (5 mg total) by mouth every 4 (four) hours as needed. Qty: 30 tablet, Refills: 0    predniSONE (DELTASONE) 20 MG tablet Take 0.5 tablets (10 mg total) by mouth daily before breakfast. Qty: 30 tablet, Refills: 0      CONTINUE these medications which have NOT CHANGED   Details  albuterol (PROVENTIL) (5 MG/ML) 0.5% nebulizer solution Take 2.5 mg by nebulization at bedtime as needed. As needed for shortness of breath     citalopram (CELEXA) 10 MG tablet Take 10 mg by mouth daily.      diphenhydrAMINE (BENADRYL) 25 MG tablet Take 25 mg by mouth every 6 (six) hours as needed. As needed for sleep and/or itching     Fluticasone-Salmeterol (ADVAIR DISKUS) 500-50 MCG/DOSE AEPB Inhale 1 puff into the lungs daily.     Ipratropium-Albuterol (COMBIVENT IN) Inhale 2 puffs into the lungs every 4 (four) hours as needed. As needed for shortness of breath     aspirin-acetaminophen-caffeine (EXCEDRIN MIGRAINE) 250-250-65 MG per tablet Take 3 tablets by mouth every 6 (six) hours as needed. As needed for headache       STOP taking these medications     Aspirin-Salicylamide-Caffeine (BC HEADACHE POWDER PO)         Disposition and Follow-up:  Discharge to home in stable condition with both PCP and Pulmonology follow up.  Consults:  Dr. Robert Byrum of Pulmonology  Significant Diagnostic Studies:  Dg Chest 2 View  10/21/2011  *RADIOLOGY  REPORT*  Clinical Data: Chest pain with extreme shortness of breath.  CHEST - 2 VIEW  Comparison: 10/19/2010.  Findings: The heart size and mediastinal contours are normal. The lungs are clear. There is no pleural effusion or pneumothorax. No acute osseous findings are identified.  There are postsurgical changes status post lower cervical fusion.  IMPRESSION: Stable examination.  No active cardiopulmonary process.  Original Report Authenticated By: WILLIAM B. VEAZEY, M.D.   Ct Angio Chest W/cm &/or Wo Cm  10/22/2011  *RADIOLOGY REPORT*  Clinical Data:  Chest pain, shortness of breath, evaluate for PE  CT ANGIOGRAPHY CHEST WITH CONTRAST  Technique:  Multidetector CT imaging of the chest was performed using the standard protocol during bolus administration of intravenous contrast.  Multiplanar CT image reconstructions including MIPs were obtained to evaluate the vascular anatomy.  Contrast:  100 ml Omnipaque-300 IV  Comparison:  Chest radiographs dated 10/21/2011.  Findings:  No evidence of pulmonary embolism.  Evaluation of the lung parenchyma is constrained by respiratory motion.  Mild centrilobular emphysema.  No suspicious pulmonary nodules. No pleural effusion or pneumothorax.  The visualized thyroid is unremarkable.  Heart is normal in size.  No pericardial effusion.  No suspicious mediastinal, hilar, or axillary lymphadenopathy.  The visualized upper abdomen is unremarkable.  Mild degenerative changes of the visualized thoracolumbar spine.  Review of the MIP images confirms the above findings.  IMPRESSION: No evidence of pulmonary embolism.  Mild centrilobular emphysema.  Original Report Authenticated   By: SRIYESH KRISHNAN, M.D.    Brief H and P: 50 year old female with h/o asthma/COPD presents with shortness of breath which has been ongoing for last week. In addition patient has been having cough with productive sputum and some exertional chest pressure last two days. She had come to ER yesterday and was  treated with nebs despite which patient had persistent symptoms which made her to come to ER today. Today Chest xray is nehative for anything acute but patient still has wheezing. Patient will be admitted for further management.  Hospital Course:   The admitting physician assessed her with an asthma COPD exacerbation; chest pressure; leukocytosis; history of cigarette smoking; history of migraines. He admitted her to a telemetry bed which was quickly upgraded to step down in bed. Her shortness of breath she was initially treated with nebulizers steroids and antibiotics cardiac markers were cycled to evaluate her chest pain and a 2-D echo was obtained further CT angios of her chest was obtained. Urine drug screen was checked. And because she had significant leukocytosis blood cultures were obtained.   Pulmonology was consulted and Dr. Byrum evaluated Ms. Keatley.  His impression is as follows:   1.  Acute asthma exacerbation precipitated by an upper respiratory infection. Her overall control has been impacted by the fact that she doesn't have a stable home situation, is unable to get her medications reliably.  Change her back to her usual Advair 500/50 today, follow UA status and stridor to insure that she tolerates. Reviewed w her that she will need to rinse out mouth after using.  Taper prednisone over approximately 2 weeks.  CT scan of the chest did not show any evidence of pneumonia, last day Levaquin (5 days total) is today 11/8.  It will be important for Ms. Dyches to have improved outpatient support and more consistent health care in order to keep her asthma under good control. Appreciate case management and social work support input. I believe she can get most of her care at [facility] as long as she sees them regularly. I would be happy to follow her at [facility], but cost of multiple MD's is an issue. She should be able to get meds from [facility] pharmacy, may qualify for financial  assistance from the pharmaceutical company.  Probably d/c to home 1-2 days if Advair tolerated.   2.  Stridor Assessment: Has been a large contributor to her wheezing. Recent URI symptoms likely exacerbated her upper airway. She may also have components of allergic rhinitis and or GERD.  Agree with Protonix, would change to omeprazole generic at d/c home.  Continue loratadine 10 mg daily.  3. Leukocytosis Assessment: suspect due to demargination from steroids  - follow CBC  - Follow clinical status for any signs of active infection  At this point the patient is much improved overall she is no longer tachypneic. She is to still does have wheezing and stridor. It is stable for discharge to home.  Physical Exam on Discharge:  General: Alert, awake, oriented x3, in no acute distress. HEENT: No bruits, no goiter. Heart: Regular rate and rhythm, without murmurs, rubs, gallops. Lungs: Positive for Wheeze and Stridor heard in all lung areas.  Normal rate of breathing, no accessory muscle use. Abdomen: Soft, nontender, nondistended, positive bowel sounds. Extremities: No clubbing cyanosis or edema with positive pedal pulses. Neuro: Grossly intact, nonfocal.  Filed Vitals:   10/25/11 2009 10/25/11 2153 10/26/11 0504 10/26/11 0858  BP:  132/84 112/74     Pulse:  88 70   Temp:  97.8 F (36.6 C) 98.4 F (36.9 C)   TempSrc:  Oral Oral   Resp:  17 16   Height:      Weight:      SpO2: 93% 92% 95% 99%     Intake/Output Summary (Last 24 hours) at 10/26/11 1138 Last data filed at 10/26/11 0900  Gross per 24 hour  Intake    822 ml  Output      1 ml  Net    821 ml     CBC:    Component Value Date/Time   WBC 26.0* 10/26/2011 0555   HGB 13.7 10/26/2011 0555   HCT 40.1 10/26/2011 0555   PLT 356 10/26/2011 0555   MCV 89.9 10/26/2011 0555   NEUTROABS 5.5 09/03/2011 0857   LYMPHSABS 3.4 09/03/2011 0857   MONOABS 0.5 09/03/2011 0857   EOSABS 0.1 09/03/2011 0857   BASOSABS 0.0 09/03/2011 0857     Basic Metabolic Panel:    Component Value Date/Time   NA 140 10/26/2011 0555   K 3.8 10/26/2011 0555   CL 99 10/26/2011 0555   CO2 27 10/26/2011 0555   BUN 18 10/26/2011 0555   CREATININE 0.72 10/26/2011 0555   GLUCOSE 85 10/26/2011 0555   CALCIUM 9.9 10/26/2011 0555    Time spent on Discharge:  45 min  Signed: YORK,MARIANNE L 10/26/2011, 11:38 AM  Pt was seen and examined at bedside. I have reviewed labs and vitals which are stable and pt is hemodynamically stable. I agree with the above's assessment and plan. Plan is to discharge pt today with appointment with PCCM arranged for further management.  MAGICK-Akiva Josey [facility]   

## 2011-10-26 NOTE — Progress Notes (Signed)
[name] to be D/C'd Home per MD order.  Discussed with the patient and all questions fully answered.   [name], [name]  Home Medication Instructions [id number]   Printed on:10/26/11 1403  Medication Information                    aspirin-acetaminophen-caffeine (EXCEDRIN MIGRAINE) 250-250-65 MG per tablet Take 3 tablets by mouth every 6 (six) hours as needed. As needed for headache            diphenhydrAMINE (BENADRYL) 25 MG tablet Take 25 mg by mouth every 6 (six) hours as needed. As needed for sleep and/or itching            citalopram (CELEXA) 10 MG tablet Take 10 mg by mouth daily.             Fluticasone-Salmeterol (ADVAIR DISKUS) 500-50 MCG/DOSE AEPB Inhale 1 puff into the lungs daily.            Ipratropium-Albuterol (COMBIVENT IN) Inhale 2 puffs into the lungs every 4 (four) hours as needed. As needed for shortness of breath            albuterol (PROVENTIL) (5 MG/ML) 0.5% nebulizer solution Take 2.5 mg by nebulization at bedtime as needed. As needed for shortness of breath            aspirin EC 325 MG EC tablet Take 1 tablet (325 mg total) by mouth daily.           oxyCODONE (OXY IR/ROXICODONE) 5 MG immediate release tablet Take 1 tablet (5 mg total) by mouth every 4 (four) hours as needed.           predniSONE (DELTASONE) 20 MG tablet Take 0.5 tablets (10 mg total) by mouth daily before breakfast.             VVS, Skin clean, dry and intact without evidence of skin break down, no evidence of skin tears noted. IV catheter discontinued intact. Site without signs and symptoms of complications. Dressing and pressure applied.  An After Visit Summary was printed and given to the patient. All questions answered, pt verbalized understanding of all instructions. Follow up appointments with [facility] and Dr. Delton Coombes given. Patient escorted via WC, and D/C home via private auto.  Driggers, Rae Roam 10/26/2011 2:03 PM

## 2011-10-26 NOTE — Progress Notes (Addendum)
Patient ID: [name] MRN: [id number] DOB/AGE: [birth year] 50 y.o.  Admit date: 10/21/2011 Discharge date: 10/26/2011  Primary Care Physician:  [facility]. Pulmonologist:  Dr. Levy Pupa  Discharge Diagnoses: Present on Admission:  .ASTHMA .Stridor .Leukocytosis .HYPERTENSION .COPD   Current Discharge Medication List    START taking these medications   Details  aspirin EC 325 MG EC tablet Take 1 tablet (325 mg total) by mouth daily. Qty: 30 tablet, Refills: 3    oxyCODONE (OXY IR/ROXICODONE) 5 MG immediate release tablet Take 1 tablet (5 mg total) by mouth every 4 (four) hours as needed. Qty: 30 tablet, Refills: 0    predniSONE (DELTASONE) 20 MG tablet Take 0.5 tablets (10 mg total) by mouth daily before breakfast. Qty: 30 tablet, Refills: 0      CONTINUE these medications which have NOT CHANGED   Details  albuterol (PROVENTIL) (5 MG/ML) 0.5% nebulizer solution Take 2.5 mg by nebulization at bedtime as needed. As needed for shortness of breath     citalopram (CELEXA) 10 MG tablet Take 10 mg by mouth daily.      diphenhydrAMINE (BENADRYL) 25 MG tablet Take 25 mg by mouth every 6 (six) hours as needed. As needed for sleep and/or itching     Fluticasone-Salmeterol (ADVAIR DISKUS) 500-50 MCG/DOSE AEPB Inhale 1 puff into the lungs daily.     Ipratropium-Albuterol (COMBIVENT IN) Inhale 2 puffs into the lungs every 4 (four) hours as needed. As needed for shortness of breath     aspirin-acetaminophen-caffeine (EXCEDRIN MIGRAINE) 250-250-65 MG per tablet Take 3 tablets by mouth every 6 (six) hours as needed. As needed for headache       STOP taking these medications     Aspirin-Salicylamide-Caffeine (BC HEADACHE POWDER PO)         Disposition and Follow-up:  Discharge to home in stable condition with both PCP and Pulmonology follow up.  Consults:  Dr. Levy Pupa of Pulmonology  Significant Diagnostic Studies:  Dg Chest 2 View  10/21/2011  *RADIOLOGY  REPORT*  Clinical Data: Chest pain with extreme shortness of breath.  CHEST - 2 VIEW  Comparison: 10/19/2010.  Findings: The heart size and mediastinal contours are normal. The lungs are clear. There is no pleural effusion or pneumothorax. No acute osseous findings are identified.  There are postsurgical changes status post lower cervical fusion.  IMPRESSION: Stable examination.  No active cardiopulmonary process.  Original Report Authenticated By: Gerrianne Scale, M.D.   Ct Angio Chest W/cm &/or Wo Cm  10/22/2011  *RADIOLOGY REPORT*  Clinical Data:  Chest pain, shortness of breath, evaluate for PE  CT ANGIOGRAPHY CHEST WITH CONTRAST  Technique:  Multidetector CT imaging of the chest was performed using the standard protocol during bolus administration of intravenous contrast.  Multiplanar CT image reconstructions including MIPs were obtained to evaluate the vascular anatomy.  Contrast:  100 ml Omnipaque-300 IV  Comparison:  Chest radiographs dated 10/21/2011.  Findings:  No evidence of pulmonary embolism.  Evaluation of the lung parenchyma is constrained by respiratory motion.  Mild centrilobular emphysema.  No suspicious pulmonary nodules. No pleural effusion or pneumothorax.  The visualized thyroid is unremarkable.  Heart is normal in size.  No pericardial effusion.  No suspicious mediastinal, hilar, or axillary lymphadenopathy.  The visualized upper abdomen is unremarkable.  Mild degenerative changes of the visualized thoracolumbar spine.  Review of the MIP images confirms the above findings.  IMPRESSION: No evidence of pulmonary embolism.  Mild centrilobular emphysema.  Original Report Authenticated  By: Charline Bills, M.D.    Brief H and P: 50 year old female with h/o asthma/COPD presents with shortness of breath which has been ongoing for last week. In addition patient has been having cough with productive sputum and some exertional chest pressure last two days. She had come to ER yesterday and was  treated with nebs despite which patient had persistent symptoms which made her to come to ER today. Today Chest xray is nehative for anything acute but patient still has wheezing. Patient will be admitted for further management.  Hospital Course:   The admitting physician assessed her with an asthma COPD exacerbation; chest pressure; leukocytosis; history of cigarette smoking; history of migraines. He admitted her to a telemetry bed which was quickly upgraded to step down in bed. Her shortness of breath she was initially treated with nebulizers steroids and antibiotics cardiac markers were cycled to evaluate her chest pain and a 2-D echo was obtained further CT angios of her chest was obtained. Urine drug screen was checked. And because she had significant leukocytosis blood cultures were obtained.   Pulmonology was consulted and Dr. Delton Coombes evaluated [name].  His impression is as follows:   1.  Acute asthma exacerbation precipitated by an upper respiratory infection. Her overall control has been impacted by the fact that she doesn't have a stable home situation, is unable to get her medications reliably.  Change her back to her usual Advair 500/50 today, follow UA status and stridor to insure that she tolerates. Reviewed w her that she will need to rinse out mouth after using.  Taper prednisone over approximately 2 weeks.  CT scan of the chest did not show any evidence of pneumonia, last day Levaquin (5 days total) is today 11/8.  It will be important for [name] to have improved outpatient support and more consistent health care in order to keep her asthma under good control. Appreciate case management and social work support input. I believe she can get most of her care at [facility] as long as she sees them regularly. I would be happy to follow her at [facility], but cost of multiple MD's is an issue. She should be able to get meds from [employer], may qualify for financial  assistance from the pharmaceutical company.  Probably d/c to home 1-2 days if Advair tolerated.   2.  Stridor Assessment: Has been a large contributor to her wheezing. Recent URI symptoms likely exacerbated her upper airway. She may also have components of allergic rhinitis and or GERD.  Agree with Protonix, would change to omeprazole generic at d/c home.  Continue loratadine 10 mg daily.  3. Leukocytosis Assessment: suspect due to demargination from steroids  - follow CBC  - Follow clinical status for any signs of active infection  At this point the patient is much improved overall she is no longer tachypneic. She is to still does have wheezing and stridor. It is stable for discharge to home.  Physical Exam on Discharge:  General: Alert, awake, oriented x3, in no acute distress. HEENT: No bruits, no goiter. Heart: Regular rate and rhythm, without murmurs, rubs, gallops. Lungs: Positive for Wheeze and Stridor heard in all lung areas.  Normal rate of breathing, no accessory muscle use. Abdomen: Soft, nontender, nondistended, positive bowel sounds. Extremities: No clubbing cyanosis or edema with positive pedal pulses. Neuro: Grossly intact, nonfocal.  Filed Vitals:   10/25/11 2009 10/25/11 2153 10/26/11 0504 10/26/11 0858  BP:  132/84 112/74  Pulse:  88 70   Temp:  97.8 F (36.6 C) 98.4 F (36.9 C)   TempSrc:  Oral Oral   Resp:  17 16   Height:      Weight:      SpO2: 93% 92% 95% 99%     Intake/Output Summary (Last 24 hours) at 10/26/11 1138 Last data filed at 10/26/11 0900  Gross per 24 hour  Intake    822 ml  Output      1 ml  Net    821 ml     CBC:    Component Value Date/Time   WBC 26.0* 10/26/2011 0555   HGB 13.7 10/26/2011 0555   HCT 40.1 10/26/2011 0555   PLT 356 10/26/2011 0555   MCV 89.9 10/26/2011 0555   NEUTROABS 5.5 09/03/2011 0857   LYMPHSABS 3.4 09/03/2011 0857   MONOABS 0.5 09/03/2011 0857   EOSABS 0.1 09/03/2011 0857   BASOSABS 0.0 09/03/2011 0857     Basic Metabolic Panel:    Component Value Date/Time   NA 140 10/26/2011 0555   K 3.8 10/26/2011 0555   CL 99 10/26/2011 0555   CO2 27 10/26/2011 0555   BUN 18 10/26/2011 0555   CREATININE 0.72 10/26/2011 0555   GLUCOSE 85 10/26/2011 0555   CALCIUM 9.9 10/26/2011 0555    Time spent on Discharge:  45 min  Signed: Algis Downs L 10/26/2011, 11:38 AM  Pt was seen and examined at bedside. I have reviewed labs and vitals which are stable and pt is hemodynamically stable. I agree with the above's assessment and plan. Plan is to discharge pt today with appointment with PCCM arranged for further management.  MAGICK-Attie Nawabi [facility]

## 2011-10-26 NOTE — Progress Notes (Signed)
PT [facility] APPT CHANGED TO MON NOV 12TH AT 915, PT MISSED TODAYS APPT D/T CONT INPATIENT [facility] HOSPITAL.  ADP IS TO DC TODAY.  WILL F/U. Willa Rough 10/26/2011 [phone] OR [phone]

## 2011-10-28 LAB — CULTURE, BLOOD (ROUTINE X 2)
Culture  Setup Time: 201211051437
Culture  Setup Time: 201211051438
Culture: NO GROWTH
Culture: NO GROWTH

## 2011-10-29 ENCOUNTER — Other Ambulatory Visit: Payer: Self-pay | Admitting: Internal Medicine

## 2011-10-29 DIAGNOSIS — Z1231 Encounter for screening mammogram for malignant neoplasm of breast: Secondary | ICD-10-CM

## 2011-11-20 ENCOUNTER — Other Ambulatory Visit: Payer: Self-pay | Admitting: Obstetrics and Gynecology

## 2011-11-22 ENCOUNTER — Ambulatory Visit: Payer: Medicaid Other | Attending: Family Medicine

## 2011-11-22 DIAGNOSIS — M2560 Stiffness of unspecified joint, not elsewhere classified: Secondary | ICD-10-CM | POA: Insufficient documentation

## 2011-11-22 DIAGNOSIS — IMO0001 Reserved for inherently not codable concepts without codable children: Secondary | ICD-10-CM | POA: Insufficient documentation

## 2011-11-22 DIAGNOSIS — M2550 Pain in unspecified joint: Secondary | ICD-10-CM | POA: Insufficient documentation

## 2011-11-22 DIAGNOSIS — R5381 Other malaise: Secondary | ICD-10-CM | POA: Insufficient documentation

## 2011-11-27 ENCOUNTER — Ambulatory Visit: Payer: Medicaid Other | Admitting: Physical Therapy

## 2011-11-28 ENCOUNTER — Ambulatory Visit (HOSPITAL_COMMUNITY)
Admission: RE | Admit: 2011-11-28 | Discharge: 2011-11-28 | Disposition: A | Discharge status: Home / Self Care | Payer: Self-pay | Source: Ambulatory Visit | Attending: Internal Medicine | Admitting: Internal Medicine

## 2011-11-28 DIAGNOSIS — Z1231 Encounter for screening mammogram for malignant neoplasm of breast: Secondary | ICD-10-CM | POA: Insufficient documentation

## 2011-11-29 ENCOUNTER — Ambulatory Visit: Payer: Medicaid Other | Admitting: Physical Therapy

## 2011-12-04 ENCOUNTER — Ambulatory Visit: Payer: Medicaid Other | Admitting: Physical Therapy

## 2011-12-06 ENCOUNTER — Ambulatory Visit: Payer: Medicaid Other

## 2011-12-17 ENCOUNTER — Ambulatory Visit: Payer: Medicaid Other | Admitting: Physical Therapy

## 2011-12-20 ENCOUNTER — Ambulatory Visit (HOSPITAL_COMMUNITY)
Admission: RE | Admit: 2011-12-20 | Discharge: 2011-12-20 | Disposition: A | Discharge status: Home / Self Care | Payer: Medicaid Other | Source: Ambulatory Visit | Attending: Gastroenterology | Admitting: Gastroenterology

## 2011-12-20 ENCOUNTER — Encounter (HOSPITAL_COMMUNITY): Payer: Self-pay | Admitting: *Deleted

## 2011-12-20 ENCOUNTER — Encounter (HOSPITAL_COMMUNITY)
Admission: RE | Disposition: A | Discharge status: Home / Self Care | Payer: Self-pay | Source: Ambulatory Visit | Attending: Gastroenterology

## 2011-12-20 DIAGNOSIS — Z800 Family history of malignant neoplasm of digestive organs: Secondary | ICD-10-CM | POA: Insufficient documentation

## 2011-12-20 DIAGNOSIS — R195 Other fecal abnormalities: Secondary | ICD-10-CM | POA: Insufficient documentation

## 2011-12-20 DIAGNOSIS — K644 Residual hemorrhoidal skin tags: Secondary | ICD-10-CM | POA: Insufficient documentation

## 2011-12-20 HISTORY — PX: COLONOSCOPY: SHX5424

## 2011-12-20 SURGERY — COLONOSCOPY
Anesthesia: Moderate Sedation

## 2011-12-20 MED ORDER — FENTANYL NICU IV SYRINGE 50 MCG/ML
INJECTION | INTRAMUSCULAR | Status: DC | PRN
  Administered 2011-12-20 (×3): 25 ug via INTRAVENOUS

## 2011-12-20 MED ORDER — MIDAZOLAM HCL 10 MG/2ML IJ SOLN
INTRAMUSCULAR | Status: DC | PRN
  Administered 2011-12-20: 1 mg via INTRAVENOUS
  Administered 2011-12-20 (×2): 2 mg via INTRAVENOUS

## 2011-12-20 MED ORDER — FENTANYL CITRATE 0.05 MG/ML IJ SOLN
INTRAMUSCULAR | Status: AC
  Filled 2011-12-20: qty 4

## 2011-12-20 MED ORDER — SODIUM CHLORIDE 0.9 % IV SOLN
Freq: Once | INTRAVENOUS | Status: AC
  Administered 2011-12-20: 500 mL via INTRAVENOUS

## 2011-12-20 MED ORDER — DIPHENHYDRAMINE HCL 50 MG/ML IJ SOLN
INTRAMUSCULAR | Status: DC | PRN
  Administered 2011-12-20: 12.5 mg via INTRAVENOUS

## 2011-12-20 MED ORDER — MIDAZOLAM HCL 10 MG/2ML IJ SOLN
INTRAMUSCULAR | Status: AC
  Filled 2011-12-20: qty 4

## 2011-12-20 MED ORDER — DIPHENHYDRAMINE HCL 50 MG/ML IJ SOLN
INTRAMUSCULAR | Status: AC
  Filled 2011-12-20: qty 1

## 2011-12-20 NOTE — H&P (Signed)
Patient interval history reviewed.  Patient examined again.  There has been no change from documented H/P dated 12/04/11 (scanned into chart from our office) except as documented above.

## 2011-12-20 NOTE — Op Note (Signed)
[facility] [address] [place], [place]  [address]  COLONOSCOPY PROCEDURE REPORT  PATIENT:  [name], [name]  MR#:  [id number] BIRTHDATE:  1961/11/05, 50 yrs. old  GENDER:  female ENDOSCOPIST:  Willis Modena REF. BY:  Nancee Liter, M.D. PROCEDURE DATE:  12/20/2011 PROCEDURE:  Incomplete colonoscopy ASA CLASS:  Class II INDICATIONS:  hemoccult-positive stool, family history colon cancer MEDICATIONS:   Benadryl 12.5 mg IV, Versed 5 mg IV, Fentanyl 75 mcg IV  DESCRIPTION OF PROCEDURE:   After the risks benefits and alternatives of the procedure were thoroughly explained, informed consent was obtained.  external hemorrhoids The Pentax Ped Colon [id number] endoscope was introduced through the anus and advanced to the sigmoid colon, limited by poor preparation.    The quality of the prep was poor..  The instrument was then slowly withdrawn as the colon was fully examined.  FINDINGS:  External hemorrhoids, otherwise normal digital rectal exam.  Prep quality was poor and became worse as I passed the colonoscope proximally. Significant pathology could easily be missed.  Procedure stopped.  ENDOSCOPIC IMPRESSION:     1.  External hemorrhoids. 2.  Poor bowel preparation.  RECOMMENDATIONS:       1.  Watch for potential complications of procedure. 2.  Needs repeat colonoscopy  after further bowel preparation.  REPEAT EXAM:  No  ______________________________ Willis Modena  CC:  n. eSIGNEDWillis Modena at 12/20/2011 08:47 AM  Gordy Levan, [phone]

## 2011-12-20 NOTE — Brief Op Note (Signed)
Please see [facility] note dated 12/20/11. 

## 2011-12-24 ENCOUNTER — Encounter: Payer: Self-pay | Admitting: Physical Therapy

## 2011-12-24 ENCOUNTER — Encounter (HOSPITAL_COMMUNITY): Payer: Self-pay | Admitting: Gastroenterology

## 2011-12-27 ENCOUNTER — Ambulatory Visit: Payer: Self-pay | Attending: Family Medicine | Admitting: Physical Therapy

## 2011-12-27 DIAGNOSIS — M2550 Pain in unspecified joint: Secondary | ICD-10-CM | POA: Insufficient documentation

## 2011-12-27 DIAGNOSIS — M2560 Stiffness of unspecified joint, not elsewhere classified: Secondary | ICD-10-CM | POA: Insufficient documentation

## 2011-12-27 DIAGNOSIS — R5381 Other malaise: Secondary | ICD-10-CM | POA: Insufficient documentation

## 2011-12-27 DIAGNOSIS — IMO0001 Reserved for inherently not codable concepts without codable children: Secondary | ICD-10-CM | POA: Insufficient documentation

## 2012-01-31 ENCOUNTER — Emergency Department (HOSPITAL_COMMUNITY)
Admission: EM | Admit: 2012-01-31 | Discharge: 2012-02-01 | Disposition: A | Discharge status: Home / Self Care | Payer: Self-pay | Attending: Emergency Medicine | Admitting: Emergency Medicine

## 2012-01-31 ENCOUNTER — Other Ambulatory Visit: Payer: Self-pay

## 2012-01-31 ENCOUNTER — Emergency Department (HOSPITAL_COMMUNITY): Payer: Self-pay

## 2012-01-31 ENCOUNTER — Encounter (HOSPITAL_COMMUNITY): Payer: Self-pay | Admitting: Emergency Medicine

## 2012-01-31 DIAGNOSIS — I10 Essential (primary) hypertension: Secondary | ICD-10-CM | POA: Insufficient documentation

## 2012-01-31 DIAGNOSIS — G43909 Migraine, unspecified, not intractable, without status migrainosus: Secondary | ICD-10-CM | POA: Insufficient documentation

## 2012-01-31 DIAGNOSIS — I739 Peripheral vascular disease, unspecified: Secondary | ICD-10-CM | POA: Insufficient documentation

## 2012-01-31 DIAGNOSIS — Z79899 Other long term (current) drug therapy: Secondary | ICD-10-CM | POA: Insufficient documentation

## 2012-01-31 DIAGNOSIS — H9209 Otalgia, unspecified ear: Secondary | ICD-10-CM | POA: Insufficient documentation

## 2012-01-31 DIAGNOSIS — R29898 Other symptoms and signs involving the musculoskeletal system: Secondary | ICD-10-CM | POA: Insufficient documentation

## 2012-01-31 LAB — POCT I-STAT, CHEM 8
BUN: 8 mg/dL (ref 6–23)
Calcium, Ion: 1.11 mmol/L — ABNORMAL LOW (ref 1.12–1.32)
Chloride: 107 mEq/L (ref 96–112)
Creatinine, Ser: 0.5 mg/dL (ref 0.50–1.10)
Glucose, Bld: 91 mg/dL (ref 70–99)
HCT: 39 % (ref 36.0–46.0)
Hemoglobin: 13.3 g/dL (ref 12.0–15.0)
Potassium: 4.2 mEq/L (ref 3.5–5.1)
Sodium: 141 mEq/L (ref 135–145)
TCO2: 26 mmol/L (ref 0–100)

## 2012-01-31 MED ORDER — SODIUM CHLORIDE 0.9 % IV BOLUS (SEPSIS)
1000.0000 mL | Freq: Once | INTRAVENOUS | Status: AC
  Administered 2012-01-31: 1000 mL via INTRAVENOUS

## 2012-01-31 MED ORDER — METOCLOPRAMIDE HCL 5 MG/ML IJ SOLN
10.0000 mg | Freq: Once | INTRAMUSCULAR | Status: AC
  Administered 2012-01-31: 10 mg via INTRAVENOUS
  Filled 2012-01-31: qty 2

## 2012-01-31 MED ORDER — DIPHENHYDRAMINE HCL 50 MG/ML IJ SOLN
25.0000 mg | Freq: Once | INTRAMUSCULAR | Status: AC
  Administered 2012-01-31: 25 mg via INTRAVENOUS
  Filled 2012-01-31: qty 1

## 2012-01-31 MED ORDER — KETOROLAC TROMETHAMINE 30 MG/ML IJ SOLN
30.0000 mg | Freq: Once | INTRAMUSCULAR | Status: AC
  Administered 2012-01-31: 30 mg via INTRAVENOUS
  Filled 2012-01-31: qty 1

## 2012-01-31 MED ORDER — DEXAMETHASONE SODIUM PHOSPHATE 10 MG/ML IJ SOLN
10.0000 mg | Freq: Once | INTRAMUSCULAR | Status: AC
  Administered 2012-01-31: 10 mg via INTRAVENOUS
  Filled 2012-01-31: qty 1

## 2012-01-31 NOTE — ED Notes (Signed)
Pt alert, nad, c/o headache, onset was a few days ago, resp even unlabored, exp wheezes noted, moist NPC noted, skin pwd, pt drove self to ER, ambulates with steady gait

## 2012-02-01 NOTE — ED Provider Notes (Signed)
History     CSN: [id number]  Arrival date & time 01/31/12  4098   First MD Initiated Contact with Patient 01/31/12 2141      Chief Complaint  Patient presents with  . Headache  . Otalgia     Patient is a 51 y.o. female presenting with headaches and ear pain. The history is provided by the patient.  Headache  This is a chronic problem. The current episode started more than 2 days ago. The problem occurs constantly. The problem has been gradually worsening. The headache is associated with nothing. The pain is located in the left unilateral region. The quality of the pain is described as throbbing and dull. The pain is at a severity of 10/10. The pain is severe. The pain radiates to the face. Associated symptoms include nausea. Pertinent negatives include no fever and no vomiting. She has tried acetaminophen, NSAIDs and oral narcotic analgesics for the symptoms. The treatment provided no relief.  Otalgia Associated symptoms include headaches. Pertinent negatives include no vomiting.  Patient reports onset of left-sided headache on Monday that has persisted. Admits to history of migraines. States headache is consistent with migraine headaches that she's had in the past. Patient reports that she used to be a [facility] but lost her insurance and wasn't able to afford. At one point the physician at the [facility] was considering placing patient on a daily medication for migraines the patient could not afford. States the headache is associated with nausea without vomiting. Today at approximately 6:30 pm and in her arms and her  patient had sudden weakness of her left arm. States that she dropped a cup she was carrying and was unable to pick it up. Since she has had the persistent headache she was concerned about this left arm weakness. States the weakness lasted about 5-10 minutes and then completely resolved. Denies that she had other symptoms at that time.  Continues to have left-sided headache that radiates into the left side of her face and left ear. Denies any recent illnesses or other symptoms. Past Medical History  Diagnosis Date  . Asthma   . Anxiety   . COPD (chronic obstructive pulmonary disease)   . Angina   . Shortness of breath   . Depression   . Hypertension   . GERD (gastroesophageal reflux disease)   . Headache   . Arthritis   . Peripheral vascular disease     Past Surgical History  Procedure Date  . Neck surgery   . Toe surgery   . Abdominal hysterectomy   . Tubal ligation   . Kidney stone surgery   . Hemorrhoid surgery   . Incise and drain abcess   . Back surgery   . Diagnostic laparoscopy   . Colonoscopy 12/20/2011    Procedure: COLONOSCOPY;  Surgeon: Freddy Jaksch, MD;  Location: [facility] ENDOSCOPY;  Service: Endoscopy;  Laterality: N/A;    Family History  Problem Relation Age of Onset  . Diabetes Mother   . COPD Mother   . Heart disease Mother   . Diabetes Father   . Kidney disease Father   . Cancer Father   . Anesthesia problems Father   . Diabetes Sister   . Hypertension Sister   . Diabetes Brother   . Sleep apnea Brother   . Heart disease Sister   . Diabetes Sister   . Heart disease Brother     History  Substance Use Topics  . Smoking  status: Former Smoker    Quit date: 04/20/2011  . Smokeless tobacco: Not on file  . Alcohol Use: No    OB History    Grav Para Term Preterm Abortions TAB SAB Ect Mult Living   7 3 3  4 1 3   3       Review of Systems  Constitutional: Negative.  Negative for fever.  HENT: Positive for ear pain.   Eyes: Negative.   Respiratory: Negative.   Cardiovascular: Negative.   Gastrointestinal: Positive for nausea. Negative for vomiting.  Genitourinary: Negative.   Musculoskeletal: Negative.   Skin: Negative.   Neurological: Positive for headaches.  Hematological: Negative.   Psychiatric/Behavioral: Negative.     Allergies  Sulfa antibiotics and Sulfonamide  derivatives  Home Medications   Current Outpatient Rx  Name Route Sig Dispense Refill  . ALBUTEROL SULFATE (5 MG/ML) 0.5% IN NEBU Nebulization Take 2.5 mg by nebulization at bedtime as needed. As needed for shortness of breath     . ASPIRIN-ACETAMINOPHEN-CAFFEINE 250-250-65 MG PO TABS Oral Take 3 tablets by mouth every 6 (six) hours as needed. As needed for headache     . CITALOPRAM HYDROBROMIDE 10 MG PO TABS Oral Take 10 mg by mouth daily.      Marland Kitchen DIPHENHYDRAMINE HCL 25 MG PO TABS Oral Take 25 mg by mouth every 6 (six) hours as needed. As needed for sleep and/or itching     . FLUTICASONE-SALMETEROL 500-50 MCG/DOSE IN AEPB Inhalation Inhale 1 puff into the lungs daily.     Effie Berkshire IN Inhalation Inhale 2 puffs into the lungs every 4 (four) hours as needed. As needed for shortness of breath       BP 149/85  Pulse 83  Temp 98.2 F (36.8 C)  Resp 24  Wt 180 lb (81.647 kg)  SpO2 99%  Physical Exam  Constitutional: She is oriented to person, place, and time. She appears well-developed and well-nourished.  HENT:  Head: Normocephalic and atraumatic.  Eyes: Conjunctivae and EOM are normal. Pupils are equal, round, and reactive to light.  Neck: Neck supple.  Cardiovascular: Normal rate and regular rhythm.   Pulmonary/Chest: Effort normal and breath sounds normal.  Abdominal: Soft. Bowel sounds are normal.  Musculoskeletal: Normal range of motion.  Neurological: She is alert and oriented to person, place, and time. She has normal strength. No cranial nerve deficit or sensory deficit. Gait normal. GCS eye subscore is 4. GCS verbal subscore is 5. GCS motor subscore is 6.  Skin: Skin is warm and dry. No erythema.  Psychiatric: She has a normal mood and affect.    ED Course  Procedures    Date: 02/01/2012  Rate: 79  Rhythm: normal sinus rhythm  QRS Axis: normal  Intervals: normal  ST/T Wave abnormalities: normal  Conduction Disutrbances:none  Narrative Interpretation:   Old EKG  Reviewed: unchanged   Ct head negative. Pt reports complete resolution of headache after IVF's ansd medications. Findings and clinical impression discussed with patient. Will plan for discharge home and encourage close follow up with her primary care physician at [facility] if h/a's persist. Patient agreeable with plan.  Labs Reviewed  POCT I-STAT, CHEM 8 - Abnormal; Notable for the following:    Calcium, Ion 1.11 (*)    All other components within normal limits   Ct Head Wo Contrast  01/31/2012  *RADIOLOGY REPORT*  Clinical Data: Headache.  CT HEAD WITHOUT CONTRAST  Technique:  Contiguous axial images were obtained from the base of the  skull through the vertex without contrast.  Comparison: CT head without contrast 07/06/2010.  Findings: No acute intracranial abnormality is present. Specifically, there is no evidence for acute infarct, hemorrhage, mass, hydrocephalus, or extra-axial fluid collection.  The paranasal sinuses and mastoid air cells are clear.  The globes and orbits are intact.  The osseous skull is intact.  IMPRESSION: Negative CT of the head.  Original Report Authenticated By: Jamesetta Orleans. MATTERN, M.D.     No diagnosis found.    MDM  HPI/PE and clinical findings c/w 1. Migraine h/a (resolved) 2. Upper extremity weakness (resolved) No focal neurological findings. Ct head negative.        Leanne Chang, NP 02/01/12 0033  Roma Kayser Shawn Dannenberg, NP 02/01/12 [phone]

## 2012-02-01 NOTE — ED Provider Notes (Signed)
Medical screening examination/treatment/procedure(s) were performed by non-physician practitioner and as supervising physician I was immediately available for consultation/collaboration.   Gwyneth Sprout, MD 02/01/12 [phone]

## 2012-02-01 NOTE — Discharge Instructions (Signed)
Please review the instructions below. CT of the head tonight was normal. The exact causeof your left arm weakness earlier this evening remains unclear, however could be related to your migraine headache. Your neurological examination is completely normal. You report that your headache is completely resolved with IV medications. We encouraged follow up with your [facility] at [facility] if you're headaches persist. Return for worsening symptoms.  Migraine Headache A migraine is very bad pain on one or both sides of your head. The cause of a migraine is not always known. A migraine can be triggered or caused by different things, such as:  Alcohol.   Smoking.   Stress.   Periods (menstruation) in women.   Aged cheeses.   Foods or drinks that contain nitrates, glutamate, aspartame, or tyramine.   Lack of sleep.   Chocolate.   Caffeine.   Hunger.   Medicines, such as nitroglycerine (used to treat chest pain), birth control pills, estrogen, and some blood pressure medicines.  HOME CARE  Many medicines can help migraine pain or keep migraines from coming back. Your doctor can help you decide on a medicine or treatment program.   If you or your child gets a migraine, it may help to lie down in a dark, quiet room.   Keep a headache journal. This may help find out what is causing the headaches. For example, write down:   What you eat and drink.   How much sleep you get.   Any change to your diet or medicines.  GET HELP RIGHT AWAY IF:   The medicine does not work.   The pain begins again.   The neck is stiff.   You have trouble seeing.   The muscles are weak or you lose muscle control.   You have new symptoms.   You lose your balance.   You have trouble walking.   You feel faint or pass out.  MAKE SURE YOU:   Understand these instructions.   Will watch this condition.   Will get help right away if you are not doing well or get worse.  Document  Released: 09/11/2008 Document Revised: 08/15/2011 Document Reviewed: 08/08/2009 [facility] Patient Information 2012 [place], [place].

## 2012-02-09 ENCOUNTER — Emergency Department (HOSPITAL_COMMUNITY)
Admission: EM | Admit: 2012-02-09 | Discharge: 2012-02-09 | Disposition: A | Discharge status: Home / Self Care | Payer: Self-pay | Source: Home / Self Care

## 2012-02-09 ENCOUNTER — Encounter (HOSPITAL_COMMUNITY): Payer: Self-pay | Admitting: *Deleted

## 2012-02-09 ENCOUNTER — Telehealth (HOSPITAL_COMMUNITY): Payer: Self-pay | Admitting: Physician Assistant

## 2012-02-09 DIAGNOSIS — H60399 Other infective otitis externa, unspecified ear: Secondary | ICD-10-CM

## 2012-02-09 DIAGNOSIS — H6092 Unspecified otitis externa, left ear: Secondary | ICD-10-CM

## 2012-02-09 MED ORDER — CIPROFLOXACIN-HYDROCORTISONE 0.2-1 % OT SUSP: 3.0000 [drp] | Freq: Two times a day (BID) | OTIC | Status: AC

## 2012-02-09 NOTE — ED Notes (Signed)
Pharmacy called stating patient could not afford Cipro HC and requesting it be changed to Cortisporin Otic.  Chart reviewed by Trixie Dredge PA  Change approved

## 2012-02-09 NOTE — ED Provider Notes (Signed)
Medical screening examination/treatment/procedure(s) were performed by non-physician practitioner and as supervising physician I was immediately available for consultation/collaboration.  Leslee Home, M.D.   Roque Lias, MD 02/09/12 2031

## 2012-02-09 NOTE — ED Provider Notes (Signed)
History     CSN: [id number]  Arrival date & time 02/09/12  1312   None     Chief Complaint  Patient presents with  . Otalgia  . Neck Pain  . Headache    (Consider location/radiation/quality/duration/timing/severity/associated sxs/prior treatment) HPI Comments: Patient reports aching pain in her left ear that radiates into her left face and rear molars on the left.  Denies fevers, sore throat, any known cavities, recent illness.  No discharge from her ear.    Patient is a 50 y.o. female presenting with ear pain, neck pain, and headaches. The history is provided by the patient.  Otalgia This is a recurrent problem. There is pain in the left ear. The problem occurs hourly. The problem has not changed since onset.There has been no fever. Associated symptoms include headaches. Pertinent negatives include no ear discharge, no rhinorrhea, no sore throat and no cough.  Neck Pain  Associated symptoms include headaches.  Headache The primary symptoms include headaches.    Past Medical History  Diagnosis Date  . Asthma   . Anxiety   . COPD (chronic obstructive pulmonary disease)   . Angina   . Shortness of breath   . Depression   . Hypertension   . GERD (gastroesophageal reflux disease)   . Headache   . Arthritis   . Peripheral vascular disease   . Migraine     Past Surgical History  Procedure Date  . Neck surgery   . Toe surgery   . Abdominal hysterectomy   . Tubal ligation   . Kidney stone surgery   . Hemorrhoid surgery   . Incise and drain abcess   . Back surgery   . Diagnostic laparoscopy   . Colonoscopy 12/20/2011    Procedure: COLONOSCOPY;  Surgeon: Freddy Jaksch, MD;  Location: [facility];  Service: Endoscopy;  Laterality: N/A;    Family History  Problem Relation Age of Onset  . Diabetes Mother   . COPD Mother   . Heart disease Mother   . Diabetes Father   . Kidney disease Father   . Cancer Father   . Anesthesia problems Father   . Diabetes Sister     . Hypertension Sister   . Diabetes Brother   . Sleep apnea Brother   . Heart disease Sister   . Diabetes Sister   . Heart disease Brother     History  Substance Use Topics  . Smoking status: Former Smoker    Quit date: 04/20/2011  . Smokeless tobacco: Not on file  . Alcohol Use: No    OB History    Grav Para Term Preterm Abortions TAB SAB Ect Mult Living   7 3 3  4 1 3   3       Review of Systems  HENT: Positive for ear pain. Negative for sore throat, rhinorrhea and ear discharge.   Respiratory: Negative for cough.   Neurological: Positive for headaches.  All other systems reviewed and are negative.    Allergies  Sulfa antibiotics and Sulfonamide derivatives  Home Medications   Current Outpatient Rx  Name Route Sig Dispense Refill  . ALBUTEROL SULFATE (5 MG/ML) 0.5% IN NEBU Nebulization Take 2.5 mg by nebulization at bedtime as needed. As needed for shortness of breath     . ASPIRIN-ACETAMINOPHEN-CAFFEINE 250-250-65 MG PO TABS Oral Take 3 tablets by mouth every 6 (six) hours as needed. As needed for headache     . BC HEADACHE POWDER PO Oral Take by mouth.    Marland Kitchen  CITALOPRAM HYDROBROMIDE 10 MG PO TABS Oral Take 10 mg by mouth daily.      Marland Kitchen DIPHENHYDRAMINE HCL 25 MG PO TABS Oral Take 25 mg by mouth every 6 (six) hours as needed. As needed for sleep and/or itching     . FLUTICASONE-SALMETEROL 500-50 MCG/DOSE IN AEPB Inhalation Inhale 1 puff into the lungs daily.     Effie Berkshire IN Inhalation Inhale 2 puffs into the lungs every 4 (four) hours as needed. As needed for shortness of breath       BP 129/95  Pulse 64  Temp(Src) 97.3 F (36.3 C) (Oral)  Resp 18  SpO2 97%  Physical Exam  Nursing note and vitals reviewed. Constitutional: She is oriented to person, place, and time. She appears well-developed and well-nourished.  HENT:  Head: Normocephalic and atraumatic.  Right Ear: Tympanic membrane, external ear and ear canal normal.  Left Ear: Tympanic membrane normal.  There is swelling and tenderness.  Nose: Mucosal edema and rhinorrhea present. Right sinus exhibits no maxillary sinus tenderness and no frontal sinus tenderness. Left sinus exhibits no maxillary sinus tenderness and no frontal sinus tenderness.  Mouth/Throat: Uvula is midline, oropharynx is clear and moist and mucous membranes are normal. Normal dentition. No dental abscesses, uvula swelling or dental caries. No oropharyngeal exudate, posterior oropharyngeal edema, posterior oropharyngeal erythema or tonsillar abscesses.       Swelling and tenderness within ear canal. No mastoid tenderness.    Neck: Trachea normal, normal range of motion and phonation normal. Neck supple. No tracheal tenderness present. No rigidity. No tracheal deviation and normal range of motion present.  Cardiovascular: Normal rate and regular rhythm.   Pulmonary/Chest: Effort normal and breath sounds normal. No stridor. No respiratory distress. She has no wheezes. She has no rales.  Lymphadenopathy:    She has no cervical adenopathy.  Neurological: She is alert and oriented to person, place, and time.    ED Course  Procedures (including critical care time)  Labs Reviewed - No data to display No results found.   1. Otitis externa, left       MDM  Nontoxic afebrile patient with intermittent left ear pain x weeks.  Patient with exam findings c/w otitis externa.  TM is normal and intact, dentition is normal, oropharynx clear, no mastoid tenderness.  Pt d/c home with abx drops, to return for worsening condition.  Patient verbalizes understanding and agrees with plan.          Dillard Cannon [place], [place] 02/09/12 [phone]

## 2012-02-09 NOTE — Discharge Instructions (Signed)
Otitis Externa Otitis externa ("swimmer's ear") is a germ (bacterial) or fungal infection of the outer ear canal (from the eardrum to the outside of the ear). Swimming in dirty water may cause swimmer's ear. It also may be caused by moisture in the ear from water remaining after swimming or bathing. Often the first signs of infection may be itching in the ear canal. This may progress to ear canal swelling, redness, and pus drainage, which may be signs of infection. HOME CARE INSTRUCTIONS   Apply the antibiotic drops to the ear canal as prescribed by your doctor.   This can be a very painful medical condition. A strong pain reliever may be prescribed.   Only take over-the-counter or prescription medicines for pain, discomfort, or fever as directed by your caregiver.   If your caregiver has given you a follow-up appointment, it is very important to keep that appointment. Not keeping the appointment could result in a chronic or permanent injury, pain, hearing loss and disability. If there is any problem keeping the appointment, you must call back to this facility for assistance.  PREVENTION   It is important to keep your ear dry. Use the corner of a towel to wick water out of the ear canal after swimming or bathing.   Avoid scratching in your ear. This can damage the ear canal or remove the protective wax lining the canal and make it easier for germs (bacteria) or a fungus to grow.   You may use ear drops made of rubbing alcohol and vinegar after swimming to prevent future "swimmer's ear" infections. Make up a small bottle of equal parts white vinegar and alcohol. Put 3 or 4 drops into each ear after swimming.   Avoid swimming in lakes, polluted water, or poorly chlorinated pools.  SEEK MEDICAL CARE IF:   An oral temperature above 102 F (38.9 C) develops.   Your ear is still painful after 3 days and shows signs of getting worse (redness, swelling, pain, or pus).  MAKE SURE YOU:   Understand  these instructions.   Will watch your condition.   Will get help right away if you are not doing well or get worse.  Document Released: 12/03/2005 Document Revised: 08/15/2011 Document Reviewed: 07/09/2008 [facility] Patient Information 2012 [place], [place].  RESOURCE GUIDE  Dental Problems  Patients with Medicaid: [facility] [phone] [address]                                           [phone] [address] Phone:  [phone]                                                  Phone:  [phone]  If unable to pay or uninsured, contact:  [facility] or [facility]. to become qualified for the adult dental clinic.  Chronic Pain Problems Contact [facility]  [phone] Patients need to be referred by their primary care doctor.  Insufficient Money for Medicine Contact [employer]:  call "211" or [facility] [phone].  No Primary Care Doctor Call [facility]  [phone] [phone] Other agencies that provide inexpensive medical care    [facility]  [phone]    [facility]  [phone]    [facility]  [phone]    [facility]  [phone]    [facility]  [phone]    [facility]  [phone]  Psychological Services [facility]  [phone] [facility]  [phone] [facility]   [phone] (emergency services [phone]) [phone])  Substance Abuse Resources [facility]  (463)835-7415 [facility] 208-072-2994 [employer] 217-636-8287 Floydene Flock 647-223-0586 Residential & Outpatient Substance Abuse Program  604-651-6064  Abuse/Neglect [facility] Child Abuse Hotline 540-588-0330 [facility] Child Abuse Hotline (636)494-7456 (After Hours)  Emergency Shelter [facility] (970)503-5732  Maternity Homes [facility] at the [place] [facility] 6132479529 [facility] Alert  Services 316-631-2610  MRSA Hotline #:   217-268-0207    [facility] Resources  [facility] [place]     [employer]                          [facility] Dept. [address] [place]                       [address]      [address] [place] [address]  Blondell Reveal Phone:  [phone]                                   Phone:  [phone]                 Phone:  [phone]  [facility] Mental [facility] Phone:  [phone]  [facility] Child Abuse Hotline (819) 650-6934 [phone])[phone] (After Hours)

## 2012-02-09 NOTE — ED Notes (Signed)
Pt seen and treated 2/14 with same c/o - left ear pain /headache/neck pain

## 2012-03-05 ENCOUNTER — Ambulatory Visit (HOSPITAL_COMMUNITY)
Admission: RE | Admit: 2012-03-05 | Discharge: 2012-03-05 | Disposition: A | Discharge status: Home / Self Care | Payer: Self-pay | Source: Ambulatory Visit | Attending: Gastroenterology | Admitting: Gastroenterology

## 2012-03-05 ENCOUNTER — Encounter (HOSPITAL_COMMUNITY): Payer: Self-pay

## 2012-03-05 ENCOUNTER — Encounter (HOSPITAL_COMMUNITY)
Admission: RE | Disposition: A | Discharge status: Home / Self Care | Payer: Self-pay | Source: Ambulatory Visit | Attending: Gastroenterology

## 2012-03-05 DIAGNOSIS — K644 Residual hemorrhoidal skin tags: Secondary | ICD-10-CM | POA: Insufficient documentation

## 2012-03-05 DIAGNOSIS — Z83719 Family history of colon polyps, unspecified: Secondary | ICD-10-CM | POA: Insufficient documentation

## 2012-03-05 DIAGNOSIS — D126 Benign neoplasm of colon, unspecified: Secondary | ICD-10-CM | POA: Insufficient documentation

## 2012-03-05 DIAGNOSIS — Z800 Family history of malignant neoplasm of digestive organs: Secondary | ICD-10-CM | POA: Insufficient documentation

## 2012-03-05 DIAGNOSIS — R195 Other fecal abnormalities: Secondary | ICD-10-CM | POA: Insufficient documentation

## 2012-03-05 DIAGNOSIS — Z8371 Family history of colonic polyps: Secondary | ICD-10-CM | POA: Insufficient documentation

## 2012-03-05 HISTORY — PX: COLONOSCOPY: SHX5424

## 2012-03-05 SURGERY — COLONOSCOPY
Anesthesia: Moderate Sedation

## 2012-03-05 MED ORDER — FENTANYL CITRATE 0.05 MG/ML IJ SOLN
INTRAMUSCULAR | Status: AC
  Filled 2012-03-05: qty 2

## 2012-03-05 MED ORDER — ALBUTEROL SULFATE (5 MG/ML) 0.5% IN NEBU
2.5000 mg | INHALATION_SOLUTION | Freq: Once | RESPIRATORY_TRACT | Status: AC
  Administered 2012-03-05: 2.5 mg via RESPIRATORY_TRACT

## 2012-03-05 MED ORDER — ALBUTEROL SULFATE (5 MG/ML) 0.5% IN NEBU
INHALATION_SOLUTION | RESPIRATORY_TRACT | Status: AC
  Filled 2012-03-05: qty 0.5

## 2012-03-05 MED ORDER — MIDAZOLAM HCL 10 MG/2ML IJ SOLN
INTRAMUSCULAR | Status: AC
  Filled 2012-03-05: qty 2

## 2012-03-05 MED ORDER — SODIUM CHLORIDE 0.9 % IV SOLN
Freq: Once | INTRAVENOUS | Status: AC
  Administered 2012-03-05: 500 mL via INTRAVENOUS

## 2012-03-05 MED ORDER — FENTANYL CITRATE 0.05 MG/ML IJ SOLN
INTRAMUSCULAR | Status: DC | PRN
  Administered 2012-03-05 (×2): 25 ug via INTRAVENOUS

## 2012-03-05 MED ORDER — DIPHENHYDRAMINE HCL 50 MG/ML IJ SOLN
INTRAMUSCULAR | Status: AC
  Filled 2012-03-05: qty 1

## 2012-03-05 MED ORDER — MIDAZOLAM HCL 5 MG/5ML IJ SOLN
INTRAMUSCULAR | Status: DC | PRN
  Administered 2012-03-05: 2 mg via INTRAVENOUS
  Administered 2012-03-05: 1 mg via INTRAVENOUS
  Administered 2012-03-05: 2 mg via INTRAVENOUS

## 2012-03-05 MED ORDER — FLEET ENEMA 7-19 GM/118ML RE ENEM
ENEMA | RECTAL | Status: AC
  Filled 2012-03-05: qty 1

## 2012-03-05 NOTE — H&P (Signed)
Patient interval history reviewed.  Patient examined again.  There has been no change from documented H/P dated 02/19/12 (scanned into chart from our office) except as documented above.  Assessment:  Hemoccult-positive stool, family history colon cancer, prior incomplete colonoscopy (poor prep).  Patient had enema in endoscopy unit today showing only yellow effluent.  Accordingly, I feel she is likely adequately prepped for procedure today.  Plan:  Colonoscopy today with moderate sedation.  Risks (bleeding, infection, bowel perforation that could require surgery, sedation-related changes in cardiopulmonary systems), benefits (identification and possible treatment of source of symptoms, exclusion of certain causes of symptoms), and alternatives (watchful waiting, radiographic imaging studies, empiric medical treatment) of colonoscopy were explained to patient in detail and she wishes to proceed.

## 2012-03-05 NOTE — Discharge Instructions (Addendum)
Colonoscopy  Post procedure instructions:  Read the instructions outlined below and refer to this sheet in the next few weeks. These discharge instructions provide you with general information on caring for yourself after you leave the hospital. Your doctor may also give you specific instructions. While your treatment has been planned according to the most current medical practices available, unavoidable complications occasionally occur. If you have any problems or questions after discharge, call Dr. Dulce Sellar at [facility] [phone]).  HOME CARE INSTRUCTIONS  ACTIVITY:  You may resume your regular activity, but move at a slower pace for the next 24 hours.   Take frequent rest periods for the next 24 hours.   Walking will help get rid of the air and reduce the bloated feeling in your belly (abdomen).   No driving for 24 hours (because of the medicine (anesthesia) used during the test).   You may shower.   Do not sign any important legal documents or operate any machinery for 24 hours (because of the anesthesia used during the test).  NUTRITION:  Drink plenty of fluids.   You may resume your normal diet as instructed by your doctor.   Begin with a light meal and progress to your normal diet. Heavy or fried foods are harder to digest and may make you feel sick to your stomach (nauseated).   Avoid alcoholic beverages for 24 hours or as instructed.  MEDICATIONS:  You may resume your normal medications unless your doctor tells you otherwise.  WHAT TO EXPECT TODAY:  Some feelings of bloating in the abdomen.   Passage of more gas than usual.   Spotting of blood in your stool or on the toilet paper.  IF YOU HAD POLYPS REMOVED DURING THE COLONOSCOPY:  No aspirin products for 7 days or as instructed.   No alcohol for 7 days or as instructed.   Eat a soft diet for the next 24 hours.   FINDING OUT THE RESULTS OF YOUR TEST  Not all test results are available during your  visit. If your test results are not back during the visit, make an appointment with your caregiver to find out the results. Do not assume everything is normal if you have not heard from your caregiver or the medical facility. It is important for you to follow up on all of your test results.     SEEK IMMEDIATE MEDICAL CARE IF:   You have more than a spotting of blood in your stool.   Your belly is swollen (abdominal distention).   You are nauseated or vomiting.   You have a fever.   You have abdominal pain or discomfort that is severe or gets worse throughout the day.    Document Released: 07/17/2004 Document Revised: 08/15/2011 Document Reviewed: 07/15/2008 [facility] Patient Information 2012 [place], [place]. Colon Polyps A polyp is extra tissue that grows inside your body. Colon polyps grow in the large intestine. The large intestine, also called the colon, is part of your digestive system. It is a long, hollow tube at the end of your digestive tract where your body makes and stores stool. Most polyps are not dangerous. They are benign. This means they are not cancerous. But over time, some types of polyps can turn into cancer. Polyps that are smaller than a pea are usually not harmful. But larger polyps could someday become or may already be cancerous. To be safe, doctors remove all polyps and test them.  WHO GETS POLYPS? Anyone can get polyps, but certain  people are more likely than others. You may have a greater chance of getting polyps if:  You are over 50.   You have had polyps before.   Someone in your family has had polyps.   Someone in your family has had cancer of the large intestine.   Find out if someone in your family has had polyps. You may also be more likely to get polyps if you:   Eat a lot of fatty foods.   Smoke.   Drink alcohol.   Do not exercise.   Eat too much.  SYMPTOMS  Most small polyps do not cause symptoms. People often do not know they have one  until their caregiver finds it during a regular checkup or while testing them for something else. Some people do have symptoms like these:  Bleeding from the anus. You might notice blood on your underwear or on toilet paper after you have had a bowel movement.   Constipation or diarrhea that lasts more than a week.   Blood in the stool. Blood can make stool look black or it can show up as red streaks in the stool.  If you have any of these symptoms, see your caregiver. HOW DOES THE DOCTOR TEST FOR POLYPS? The doctor can use four tests to check for polyps:  Digital rectal exam. The caregiver wears gloves and checks your rectum (the last part of the large intestine) to see if it feels normal. This test would find polyps only in the rectum. Your caregiver may need to do one of the other tests listed below to find polyps higher up in the intestine.   Barium enema. The caregiver puts a liquid called barium into your rectum before taking x-rays of your large intestine. Barium makes your intestine look white in the pictures. Polyps are dark, so they are easy to see.   Sigmoidoscopy. With this test, the caregiver can see inside your large intestine. A thin flexible tube is placed into your rectum. The device is called a sigmoidoscope, which has a light and a tiny video camera in it. The caregiver uses the sigmoidoscope to look at the last third of your large intestine.   Colonoscopy. This test is like sigmoidoscopy, but the caregiver looks at all of the large intestine. It usually requires sedation. This is the most common method for finding and removing polyps.  TREATMENT   The caregiver will remove the polyp during sigmoidoscopy or colonoscopy. The polyp is then tested for cancer.   If you have had polyps, your caregiver may want you to get tested regularly in the future.  PREVENTION  There is not one sure way to prevent polyps. You might be able to lower your risk of getting them if you:  Eat  more fruits and vegetables and less fatty food.   Do not smoke.   Avoid alcohol.   Exercise every day.   Lose weight if you are overweight.   Eating more calcium and folate can also lower your risk of getting polyps. Some foods that are rich in calcium are milk, cheese, and broccoli. Some foods that are rich in folate are chickpeas, kidney beans, and spinach.   Aspirin might help prevent polyps. Studies are under way.  Document Released: 08/29/2004 Document Revised: 11/22/2011 Document Reviewed: 02/04/2008 [facility] Patient Information 2012 [place], [place].Colonoscopy Care After These instructions give you information on caring for yourself after your procedure. Your doctor may also give you more specific instructions. Call your doctor if you  have any problems or questions after your procedure. HOME CARE  Take it easy for the next 24 hours.   Rest.   Walk or use warm packs on your belly (abdomen) if you have belly cramping or gas.   Do not drive for 24 hours.   You may shower.   Do not sign important papers or use machinery for 24 hours.   Drink enough fluids to keep your pee (urine) clear or pale yellow.   Resume your normal diet. Avoid heavy or fried foods.   Avoid alcohol.   Continue taking your normal medicines.   Only take medicine as told by your doctor. Do not take aspirin.  If you had growths (polyps) removed:  Do not take aspirin.   Do not drink alcohol for 7 days or as told by your doctor.   Eat a soft diet for 24 hours.  GET HELP RIGHT AWAY IF:  You have a fever.   You pass clumps of tissue (blood clots) or fill the toilet with blood.   You have belly pain that gets worse and medicine does not help.   Your belly is puffy (swollen).   You feel sick to your stomach (nauseous) or throw up (vomit).  MAKE SURE YOU:  Understand these instructions.   Will watch your condition.   Will get help right away if you are not doing well or get worse.    Document Released: 01/05/2011 Document Revised: 11/22/2011 Document Reviewed: 01/05/2011 [facility] Patient Information 2012 [place], [place].

## 2012-03-05 NOTE — Op Note (Signed)
[facility] [address] [place], [place]  [address]  COLONOSCOPY PROCEDURE REPORT  PATIENT:  Felicia, [name]  MR#:  [id number] BIRTHDATE:  27-Oct-1961, 51 yrs. old  GENDER:  female ENDOSCOPIST:  Willis Modena, MD REF. BY:  Nancee Liter, M.D. PROCEDURE DATE:  03/05/2012 PROCEDURE:  Colonoscopy with biopsy ASA CLASS:  Class II INDICATIONS:  hemoccult positive stool, family history colon cancer, family history colon polyps MEDICATIONS:   Fentanyl 50 mcg IV, Versed 5 mg IV  DESCRIPTION OF PROCEDURE:   After the risks benefits and alternatives of the procedure were thoroughly explained, informed consent was obtained.  Digital rectal exam was performed and revealed external hemorrhoids.   The Pentax Peds Colonoscope [id number] endoscope was introduced through the anus and advanced to the cecum, which was identified by both the appendix and ileocecal valve, without limitations.  The quality of the prep was good.. The instrument was then slowly withdrawn as the colon was fully examined.  <<PROCEDUREIMAGES>>  FINDINGS: External hemorrhoids, otherwise normal digital rectal exam.  Prep quality was good.  4mm sigmoid polyp, removed with cold biopsy  forceps.  No other polyps, masses, vascular ectasias, or inflammatory  changes were seen.  Normal retroflexed view of rectum.  ENDOSCOPIC IMPRESSION:    1.  External hemorrhoids, highly likely source of hemoccult-positive stool. 2.  Small sigmoid polyp, removed. 3.  Otherwise normal colonoscopy.  RECOMMENDATIONS:      1.  Watch for potential complications of procedure. 2.  Await biopsy  results. 3.  Repeat colonoscopy in 5 years. 4.  Follow-up with [facility] on as-needed basis.  ______________________________ Willis Modena  CC:  n. eSIGNEDWillis Modena at 03/05/2012 08:32 AM  Gordy Levan, [phone]

## 2012-03-06 ENCOUNTER — Encounter (HOSPITAL_COMMUNITY): Payer: Self-pay

## 2012-03-06 ENCOUNTER — Encounter (HOSPITAL_COMMUNITY): Payer: Self-pay | Admitting: Gastroenterology

## 2012-07-27 ENCOUNTER — Emergency Department (HOSPITAL_COMMUNITY)
Admission: EM | Admit: 2012-07-27 | Discharge: 2012-07-27 | Disposition: A | Discharge status: Home / Self Care | Payer: Medicaid Other | Attending: Emergency Medicine | Admitting: Emergency Medicine

## 2012-07-27 ENCOUNTER — Encounter (HOSPITAL_COMMUNITY): Payer: Self-pay | Admitting: *Deleted

## 2012-07-27 DIAGNOSIS — K089 Disorder of teeth and supporting structures, unspecified: Secondary | ICD-10-CM | POA: Insufficient documentation

## 2012-07-27 DIAGNOSIS — F411 Generalized anxiety disorder: Secondary | ICD-10-CM | POA: Insufficient documentation

## 2012-07-27 DIAGNOSIS — Z882 Allergy status to sulfonamides status: Secondary | ICD-10-CM | POA: Insufficient documentation

## 2012-07-27 DIAGNOSIS — K219 Gastro-esophageal reflux disease without esophagitis: Secondary | ICD-10-CM | POA: Insufficient documentation

## 2012-07-27 DIAGNOSIS — F3289 Other specified depressive episodes: Secondary | ICD-10-CM | POA: Insufficient documentation

## 2012-07-27 DIAGNOSIS — I10 Essential (primary) hypertension: Secondary | ICD-10-CM | POA: Insufficient documentation

## 2012-07-27 DIAGNOSIS — K0889 Other specified disorders of teeth and supporting structures: Secondary | ICD-10-CM

## 2012-07-27 DIAGNOSIS — J4489 Other specified chronic obstructive pulmonary disease: Secondary | ICD-10-CM | POA: Insufficient documentation

## 2012-07-27 DIAGNOSIS — Z87891 Personal history of nicotine dependence: Secondary | ICD-10-CM | POA: Insufficient documentation

## 2012-07-27 DIAGNOSIS — F329 Major depressive disorder, single episode, unspecified: Secondary | ICD-10-CM | POA: Insufficient documentation

## 2012-07-27 DIAGNOSIS — J449 Chronic obstructive pulmonary disease, unspecified: Secondary | ICD-10-CM | POA: Insufficient documentation

## 2012-07-27 MED ORDER — HYDROCODONE-ACETAMINOPHEN 5-325 MG PO TABS: 1.0000 | ORAL_TABLET | Freq: Four times a day (QID) | ORAL | Status: AC | PRN

## 2012-07-27 MED ORDER — PENICILLIN V POTASSIUM 250 MG PO TABS
500.0000 mg | ORAL_TABLET | Freq: Once | ORAL | Status: AC
  Administered 2012-07-27: 500 mg via ORAL
  Filled 2012-07-27: qty 2

## 2012-07-27 MED ORDER — PENICILLIN V POTASSIUM 500 MG PO TABS: 500.0000 mg | ORAL_TABLET | Freq: Three times a day (TID) | ORAL | Status: AC

## 2012-07-27 NOTE — ED Notes (Signed)
Pt is here with left facial jaw swelling and tooth pain for 2 weeks

## 2012-07-27 NOTE — ED Provider Notes (Signed)
History  Scribed for Gerhard Munch, MD, the patient was seen in room TR05C/TR05C. This chart was scribed by Candelaria Stagers. The patient's care started at 2:14 PM   CSN: [id number]  Arrival date & time 07/27/12  1247   First MD Initiated Contact with Patient 07/27/12 1347      Chief Complaint  Patient presents with  . Facial Swelling    left side  . Dental Pain     The history is provided by the patient.   [name] is a 51 y.o. female who presents to the Emergency Department complaining of left ear pain, left sided jaw and left sided toothache that started two weeks ago with the ear pain and has become worse today.  Pt has taken sinus medication with no relief.  She denies difficulty swallowing or breathing.  She is also experiencing cough, chills, and sore throat.  Pt reports that she has no known dental problems.    Past Medical History  Diagnosis Date  . Asthma   . Anxiety   . COPD (chronic obstructive pulmonary disease)   . Angina   . Shortness of breath   . Depression   . Hypertension   . GERD (gastroesophageal reflux disease)   . Headache   . Arthritis   . Peripheral vascular disease   . Migraine     Past Surgical History  Procedure Date  . Neck surgery   . Toe surgery   . Abdominal hysterectomy   . Tubal ligation   . Kidney stone surgery   . Hemorrhoid surgery   . Incise and drain abcess   . Back surgery   . Diagnostic laparoscopy   . Colonoscopy 12/20/2011    Procedure: COLONOSCOPY;  Surgeon: Freddy Jaksch, MD;  Location: [facility];  Service: Endoscopy;  Laterality: N/A;  . Colonoscopy 03/05/2012    Procedure: COLONOSCOPY;  Surgeon: Freddy Jaksch, MD;  Location: [facility];  Service: Endoscopy;  Laterality: N/A;    Family History  Problem Relation Age of Onset  . Diabetes Mother   . COPD Mother   . Heart disease Mother   . Diabetes Father   . Kidney disease Father   . Cancer Father   . Anesthesia problems Father   . Diabetes  Sister   . Hypertension Sister   . Diabetes Brother   . Sleep apnea Brother   . Heart disease Sister   . Diabetes Sister   . Heart disease Brother     History  Substance Use Topics  . Smoking status: Former Smoker    Quit date: 04/20/2011  . Smokeless tobacco: Not on file  . Alcohol Use: No    OB History    Grav Para Term Preterm Abortions TAB SAB Ect Mult Living   7 3 3  4 1 3   3       Review of Systems  Constitutional:       Per HPI, otherwise negative  HENT: Positive for ear pain (left), facial swelling (left sided) and dental problem.        Per HPI, otherwise negative  Eyes: Negative.   Respiratory:       Per HPI, otherwise negative  Cardiovascular:       Per HPI, otherwise negative  Gastrointestinal: Negative for vomiting.  Genitourinary: Negative.   Musculoskeletal:       Per HPI, otherwise negative  Skin: Negative.   Neurological: Negative for syncope.    Allergies  Sulfa antibiotics and Sulfonamide  derivatives  Home Medications   Current Outpatient Rx  Name Route Sig Dispense Refill  . ALBUTEROL SULFATE (5 MG/ML) 0.5% IN NEBU Nebulization Take 2.5 mg by nebulization at bedtime as needed. As needed for shortness of breath     . IPRATROPIUM-ALBUTEROL 18-103 MCG/ACT IN AERO Inhalation Inhale 2 puffs into the lungs every 4 (four) hours as needed. For shortness of breath.    . ASPIRIN-ACETAMINOPHEN-CAFFEINE 250-250-65 MG PO TABS Oral Take 3 tablets by mouth every 6 (six) hours as needed. For migraines.    . BC HEADACHE POWDER PO Oral Take 1 Package by mouth as needed. For headaches.    Marland Kitchen CITALOPRAM HYDROBROMIDE 10 MG PO TABS Oral Take 10 mg by mouth daily.      Marland Kitchen DIPHENHYDRAMINE HCL 25 MG PO TABS Oral Take 25 mg by mouth every 6 (six) hours as needed. As needed for sleep and/or itching     . FLUTICASONE-SALMETEROL 500-50 MCG/DOSE IN AEPB Inhalation Inhale 1 puff into the lungs daily.     Marland Kitchen METHOCARBAMOL 500 MG PO TABS Oral Take 500 mg by mouth 4 (four) times  daily.    . TRAMADOL HCL 50 MG PO TABS Oral Take 50 mg by mouth every 6 (six) hours as needed. For pain.    . VENLAFAXINE HCL ER 150 MG PO CP24 Oral Take 150 mg by mouth daily.      BP 138/82  Pulse 64  Temp 98.5 F (36.9 C) (Oral)  Resp 18  SpO2 98%  Physical Exam  Nursing note and vitals reviewed. Constitutional: She is oriented to person, place, and time. She appears well-developed and well-nourished. No distress.  HENT:  Head: Normocephalic and atraumatic.       Tenderness and edema of the left inferior mandible.  Left maxillary tenderness.  No TMJ tenderness.  No tonsillar erythema, no focal abscess.  Tenderness of the second from the last molar on the bottom left.      Eyes: Conjunctivae and EOM are normal.  Neck:            Cardiovascular: Normal rate and regular rhythm.   Pulmonary/Chest: Effort normal and breath sounds normal. No stridor. No respiratory distress.  Abdominal: She exhibits no distension.  Musculoskeletal: She exhibits no edema and no tenderness.  Neurological: She is alert and oriented to person, place, and time. No cranial nerve deficit.  Skin: Skin is warm and dry.  Psychiatric: She has a normal mood and affect.    ED Course  Procedures   DIAGNOSTIC STUDIES: Oxygen Saturation is 98% on room air, normal by my interpretation.    COORDINATION OF CARE:     Labs Reviewed - No data to display No results found.   No diagnosis found.    MDM  I personally performed the services described in this documentation, which was scribed in my presence. The recorded information has been reviewed and considered.  This 51 year old female presents with a facial pain, and on exam is uncomfortable, though in no distress.  On exam she is tenderness with palpation of her left inferior penultimate molar and given her description of pain radiating to her ear there is concern for early apical abscess.  The denial of fever is somewhat reassuring.  The patient was  started on antibiotics, given analgesics, and discharged in stable condition following a discussion on the need for dental followup.       Gerhard Munch, MD 07/27/12 1622

## 2012-08-10 ENCOUNTER — Emergency Department (HOSPITAL_COMMUNITY): Payer: Medicaid Other

## 2012-08-10 ENCOUNTER — Emergency Department (HOSPITAL_COMMUNITY)
Admission: EM | Admit: 2012-08-10 | Discharge: 2012-08-10 | Disposition: A | Discharge status: Home / Self Care | Payer: Medicaid Other | Attending: Emergency Medicine | Admitting: Emergency Medicine

## 2012-08-10 ENCOUNTER — Encounter (HOSPITAL_COMMUNITY): Payer: Self-pay | Admitting: Emergency Medicine

## 2012-08-10 DIAGNOSIS — Z79899 Other long term (current) drug therapy: Secondary | ICD-10-CM | POA: Insufficient documentation

## 2012-08-10 DIAGNOSIS — R0602 Shortness of breath: Secondary | ICD-10-CM

## 2012-08-10 DIAGNOSIS — Z8739 Personal history of other diseases of the musculoskeletal system and connective tissue: Secondary | ICD-10-CM | POA: Insufficient documentation

## 2012-08-10 DIAGNOSIS — I10 Essential (primary) hypertension: Secondary | ICD-10-CM | POA: Insufficient documentation

## 2012-08-10 DIAGNOSIS — J45901 Unspecified asthma with (acute) exacerbation: Secondary | ICD-10-CM

## 2012-08-10 DIAGNOSIS — J4489 Other specified chronic obstructive pulmonary disease: Secondary | ICD-10-CM | POA: Insufficient documentation

## 2012-08-10 DIAGNOSIS — J449 Chronic obstructive pulmonary disease, unspecified: Secondary | ICD-10-CM | POA: Insufficient documentation

## 2012-08-10 DIAGNOSIS — K219 Gastro-esophageal reflux disease without esophagitis: Secondary | ICD-10-CM | POA: Insufficient documentation

## 2012-08-10 LAB — CBC
HCT: 36.5 % (ref 36.0–46.0)
Hemoglobin: 12.4 g/dL (ref 12.0–15.0)
MCH: 30.2 pg (ref 26.0–34.0)
MCHC: 34 g/dL (ref 30.0–36.0)
MCV: 89 fL (ref 78.0–100.0)
Platelets: 314 10*3/uL (ref 150–400)
RBC: 4.1 MIL/uL (ref 3.87–5.11)
RDW: 13.3 % (ref 11.5–15.5)
WBC: 9.3 10*3/uL (ref 4.0–10.5)

## 2012-08-10 LAB — BASIC METABOLIC PANEL
BUN: 11 mg/dL (ref 6–23)
CO2: 27 mEq/L (ref 19–32)
Calcium: 9.6 mg/dL (ref 8.4–10.5)
Chloride: 104 mEq/L (ref 96–112)
Creatinine, Ser: 0.63 mg/dL (ref 0.50–1.10)
GFR calc Af Amer: >90 mL/min (ref >90)
GFR calc non Af Amer: >90 mL/min (ref >90)
Glucose, Bld: 81 mg/dL (ref 70–99)
Potassium: 3.5 mEq/L (ref 3.5–5.1)
Sodium: 142 mEq/L (ref 135–145)

## 2012-08-10 LAB — POCT I-STAT TROPONIN I
Troponin i, poc: 0 ng/mL (ref 0.00–0.08)
Troponin i, poc: 0 ng/mL (ref 0.00–0.08)

## 2012-08-10 MED ORDER — ALBUTEROL SULFATE (5 MG/ML) 0.5% IN NEBU
2.5000 mg | INHALATION_SOLUTION | RESPIRATORY_TRACT | Status: DC
  Administered 2012-08-10: 2.5 mg via RESPIRATORY_TRACT
  Filled 2012-08-10: qty 0.5

## 2012-08-10 MED ORDER — PREDNISONE 20 MG PO TABS: ORAL_TABLET | ORAL | Status: AC

## 2012-08-10 MED ORDER — PREDNISONE 20 MG PO TABS
60.0000 mg | ORAL_TABLET | Freq: Once | ORAL | Status: AC
  Administered 2012-08-10: 60 mg via ORAL
  Filled 2012-08-10: qty 3

## 2012-08-10 MED ORDER — ALBUTEROL SULFATE HFA 108 (90 BASE) MCG/ACT IN AERS: 1.0000 | INHALATION_SPRAY | Freq: Four times a day (QID) | RESPIRATORY_TRACT | Status: DC | PRN

## 2012-08-10 MED ORDER — IPRATROPIUM BROMIDE 0.02 % IN SOLN
0.5000 mg | RESPIRATORY_TRACT | Status: DC
  Administered 2012-08-10: 0.5 mg via RESPIRATORY_TRACT
  Filled 2012-08-10: qty 2.5

## 2012-08-10 MED ORDER — FLUTICASONE-SALMETEROL 500-50 MCG/DOSE IN AEPB: 1.0000 | INHALATION_SPRAY | Freq: Two times a day (BID) | RESPIRATORY_TRACT | Status: DC

## 2012-08-10 NOTE — ED Provider Notes (Signed)
History     CSN: [id number]  Arrival date & time 08/10/12  1207   First MD Initiated Contact with Patient 08/10/12 1441      Chief Complaint  Patient presents with  . Chest Pain    (Consider location/radiation/quality/duration/timing/severity/associated sxs/prior treatment) HPI Comments: 51 y/o female presents with sudden onset chest pain and sob at church this morning. States chest pain located on the left radiating around the side to her back. Admits to wheezing. Denies palpitations, nausea, diaphoresis, cough, chest tightness, fever, chills, lightheadedness, dizziness. She was feeling fine earlier this morning. Has a hx of COPD and asthma. Ran out of her inhalers 3 weeks ago and has no doctor. Was in ICU for 5 days November 2012 for asthma. Denies ever needing intubation. Denies any recent long car rides or plane rides.  The history is provided by the patient.    Past Medical History  Diagnosis Date  . Asthma   . Anxiety   . COPD (chronic obstructive pulmonary disease)   . Angina   . Shortness of breath   . Depression   . Hypertension   . GERD (gastroesophageal reflux disease)   . Headache   . Arthritis   . Peripheral vascular disease   . Migraine     Past Surgical History  Procedure Date  . Neck surgery   . Toe surgery   . Abdominal hysterectomy   . Tubal ligation   . Kidney stone surgery   . Hemorrhoid surgery   . Incise and drain abcess   . Back surgery   . Diagnostic laparoscopy   . Colonoscopy 12/20/2011    Procedure: COLONOSCOPY;  Surgeon: Freddy Jaksch, MD;  Location: [facility];  Service: Endoscopy;  Laterality: N/A;  . Colonoscopy 03/05/2012    Procedure: COLONOSCOPY;  Surgeon: Freddy Jaksch, MD;  Location: [facility];  Service: Endoscopy;  Laterality: N/A;    Family History  Problem Relation Age of Onset  . Diabetes Mother   . COPD Mother   . Heart disease Mother   . Diabetes Father   . Kidney disease Father   . Cancer Father   .  Anesthesia problems Father   . Diabetes Sister   . Hypertension Sister   . Diabetes Brother   . Sleep apnea Brother   . Heart disease Sister   . Diabetes Sister   . Heart disease Brother     History  Substance Use Topics  . Smoking status: Former Smoker    Quit date: 04/20/2011  . Smokeless tobacco: Not on file  . Alcohol Use: No    OB History    Grav Para Term Preterm Abortions TAB SAB Ect Mult Living   7 3 3  4 1 3   3       Review of Systems  Constitutional: Negative for fever, chills and diaphoresis.  Respiratory: Positive for shortness of breath and wheezing. Negative for cough and chest tightness.   Cardiovascular: Positive for chest pain. Negative for palpitations.  Gastrointestinal: Negative for nausea and vomiting.  Neurological: Negative for dizziness and light-headedness.    Allergies  Sulfa antibiotics and Sulfonamide derivatives  Home Medications   Current Outpatient Rx  Name Route Sig Dispense Refill  . ALBUTEROL SULFATE (5 MG/ML) 0.5% IN NEBU Nebulization Take 2.5 mg by nebulization at bedtime as needed. As needed for shortness of breath     . IPRATROPIUM-ALBUTEROL 18-103 MCG/ACT IN AERO Inhalation Inhale 2 puffs into the lungs every 4 (four) hours as  needed. For shortness of breath.    . ASPIRIN-ACETAMINOPHEN-CAFFEINE 250-250-65 MG PO TABS Oral Take 3 tablets by mouth every 6 (six) hours as needed. For migraines.    . BC HEADACHE POWDER PO Oral Take 1 Package by mouth as needed. For headaches.    Marland Kitchen CITALOPRAM HYDROBROMIDE 10 MG PO TABS Oral Take 10 mg by mouth daily.      Marland Kitchen DIPHENHYDRAMINE HCL 25 MG PO TABS Oral Take 25 mg by mouth every 6 (six) hours as needed. As needed for sleep and/or itching     . FLUTICASONE-SALMETEROL 500-50 MCG/DOSE IN AEPB Inhalation Inhale 1 puff into the lungs daily.     Marland Kitchen METHOCARBAMOL 500 MG PO TABS Oral Take 500 mg by mouth 4 (four) times daily.    . TRAMADOL HCL 50 MG PO TABS Oral Take 50 mg by mouth every 6 (six) hours as  needed. For pain.    . VENLAFAXINE HCL ER 150 MG PO CP24 Oral Take 150 mg by mouth daily.      BP 154/111  Pulse 65  Temp 98.1 F (36.7 C) (Oral)  Resp 18  SpO2 99%  Physical Exam  ED Course  Procedures (including critical care time)   Labs Reviewed  CBC  BASIC METABOLIC PANEL  POCT I-STAT TROPONIN I   Results for orders placed during the hospital encounter of 08/10/12  CBC      Component Value Range   WBC 9.3  4.0 - 10.5 K/uL   RBC 4.10  3.87 - 5.11 MIL/uL   Hemoglobin 12.4  12.0 - 15.0 g/dL   HCT 16.1  09.6 - 04.5 %   MCV 89.0  78.0 - 100.0 fL   MCH 30.2  26.0 - 34.0 pg   MCHC 34.0  30.0 - 36.0 g/dL   RDW 40.9  81.1 - 91.4 %   Platelets 314  150 - 400 K/uL  BASIC METABOLIC PANEL      Component Value Range   Sodium 142  135 - 145 mEq/L   Potassium 3.5  3.5 - 5.1 mEq/L   Chloride 104  96 - 112 mEq/L   CO2 27  19 - 32 mEq/L   Glucose, Bld 81  70 - 99 mg/dL   BUN 11  6 - 23 mg/dL   Creatinine, Ser 7.82  0.50 - 1.10 mg/dL   Calcium 9.6  8.4 - 95.6 mg/dL   GFR calc non Af Amer >90  >90 mL/min   GFR calc Af Amer >90  >90 mL/min  POCT I-STAT TROPONIN I      Component Value Range   Troponin i, poc 0.00  0.00 - 0.08 ng/mL   Comment 3           POCT I-STAT TROPONIN I      Component Value Range   Troponin i, poc 0.00  0.00 - 0.08 ng/mL   Comment 3              Date: 08/10/2012  Rate: 66  Rhythm: sinus arrhythmia  QRS Axis: normal  Intervals: normal  ST/T Wave abnormalities: nonspecific T wave changes  Conduction Disutrbances:none  Narrative Interpretation: changes from prior ekg- sinus arrhythmia and non specific t-wave changes noted in v3. No stemi  Old EKG Reviewed: changes noted   Dg Chest 2 View  08/10/2012  *RADIOLOGY REPORT*  Clinical Data: Chest pain.  CHEST - 2 VIEW  Comparison: 10/21/2011.  Findings: The cardiac silhouette, mediastinal and hilar contours are normal and  stable.  The lungs are clear.  No pleural effusion. No pneumothorax.  The bony  thorax is intact.  Stable cervical fusion hardware.  IMPRESSION: No acute cardiopulmonary findings.   Original Report Authenticated By: P. Loralie Champagne, M.D.      1. Asthma exacerbation   2. Shortness of breath       MDM  51 y/o female with COPD and asthma presenting with chest pain and sob. Wheezing present on exam. Duoneb and prednisone given and will re-assess. Patient is PERC negative. S/s appear more respiratory than cardiac.  3:31 PM Patient's symptoms improved after duoneb tx and prednisone. States she is still a little sob but no where close to as bad as before. Chest pain is now only with movement. Will discharge with prednisone, albuterol inhaler, her advair she ran out of. Case discussed with Dr. Fonnie Jarvis who agrees with plan of care.    Trevor Mace, PA-C 08/10/12 1548

## 2012-08-10 NOTE — ED Notes (Signed)
Pt c/o left sided CP while in church today that is worse with inspiration and palpation and SOB

## 2012-08-10 NOTE — ED Provider Notes (Signed)
Normal sinus rhythm with sinus arrhythmia, ventricular rate 66, normal axis, normal intervals, nonspecific T wave changes, no comparison ECG available  Hurman Horn, MD 08/14/12 1120

## 2012-08-14 NOTE — ED Provider Notes (Signed)
Medical screening examination/treatment/procedure(s) were performed by non-physician practitioner and as supervising physician I was immediately available for consultation/collaboration.  Evagelia Knack M Luiza Carranco, MD 08/14/12 1134 

## 2012-12-23 ENCOUNTER — Inpatient Hospital Stay (HOSPITAL_COMMUNITY)
Admission: EM | Admit: 2012-12-23 | Discharge: 2012-12-29 | DRG: 190 | Disposition: A | Discharge status: Home / Self Care | Payer: Medicaid Other | Attending: Internal Medicine | Admitting: Internal Medicine

## 2012-12-23 ENCOUNTER — Encounter (HOSPITAL_COMMUNITY): Payer: Self-pay

## 2012-12-23 ENCOUNTER — Emergency Department (HOSPITAL_COMMUNITY): Payer: Medicaid Other

## 2012-12-23 DIAGNOSIS — J9600 Acute respiratory failure, unspecified whether with hypoxia or hypercapnia: Secondary | ICD-10-CM | POA: Diagnosis present

## 2012-12-23 DIAGNOSIS — R197 Diarrhea, unspecified: Secondary | ICD-10-CM | POA: Diagnosis present

## 2012-12-23 DIAGNOSIS — R51 Headache: Secondary | ICD-10-CM

## 2012-12-23 DIAGNOSIS — J45902 Unspecified asthma with status asthmaticus: Secondary | ICD-10-CM

## 2012-12-23 DIAGNOSIS — I82409 Acute embolism and thrombosis of unspecified deep veins of unspecified lower extremity: Secondary | ICD-10-CM

## 2012-12-23 DIAGNOSIS — R0602 Shortness of breath: Secondary | ICD-10-CM

## 2012-12-23 DIAGNOSIS — I739 Peripheral vascular disease, unspecified: Secondary | ICD-10-CM | POA: Diagnosis present

## 2012-12-23 DIAGNOSIS — R0789 Other chest pain: Secondary | ICD-10-CM

## 2012-12-23 DIAGNOSIS — J449 Chronic obstructive pulmonary disease, unspecified: Secondary | ICD-10-CM

## 2012-12-23 DIAGNOSIS — F329 Major depressive disorder, single episode, unspecified: Secondary | ICD-10-CM | POA: Diagnosis present

## 2012-12-23 DIAGNOSIS — J189 Pneumonia, unspecified organism: Secondary | ICD-10-CM | POA: Diagnosis present

## 2012-12-23 DIAGNOSIS — J45901 Unspecified asthma with (acute) exacerbation: Principal | ICD-10-CM | POA: Diagnosis present

## 2012-12-23 DIAGNOSIS — F3289 Other specified depressive episodes: Secondary | ICD-10-CM | POA: Diagnosis present

## 2012-12-23 DIAGNOSIS — J4489 Other specified chronic obstructive pulmonary disease: Secondary | ICD-10-CM

## 2012-12-23 DIAGNOSIS — Z87891 Personal history of nicotine dependence: Secondary | ICD-10-CM

## 2012-12-23 DIAGNOSIS — Z9119 Patient's noncompliance with other medical treatment and regimen: Secondary | ICD-10-CM

## 2012-12-23 DIAGNOSIS — J45909 Unspecified asthma, uncomplicated: Secondary | ICD-10-CM

## 2012-12-23 DIAGNOSIS — R112 Nausea with vomiting, unspecified: Secondary | ICD-10-CM | POA: Diagnosis present

## 2012-12-23 DIAGNOSIS — D72829 Elevated white blood cell count, unspecified: Secondary | ICD-10-CM

## 2012-12-23 DIAGNOSIS — Z7982 Long term (current) use of aspirin: Secondary | ICD-10-CM

## 2012-12-23 DIAGNOSIS — IMO0002 Reserved for concepts with insufficient information to code with codable children: Secondary | ICD-10-CM

## 2012-12-23 DIAGNOSIS — J441 Chronic obstructive pulmonary disease with (acute) exacerbation: Principal | ICD-10-CM

## 2012-12-23 DIAGNOSIS — F411 Generalized anxiety disorder: Secondary | ICD-10-CM | POA: Diagnosis present

## 2012-12-23 DIAGNOSIS — E876 Hypokalemia: Secondary | ICD-10-CM

## 2012-12-23 DIAGNOSIS — Z882 Allergy status to sulfonamides status: Secondary | ICD-10-CM

## 2012-12-23 DIAGNOSIS — R061 Stridor: Secondary | ICD-10-CM

## 2012-12-23 DIAGNOSIS — Z90710 Acquired absence of both cervix and uterus: Secondary | ICD-10-CM

## 2012-12-23 DIAGNOSIS — M129 Arthropathy, unspecified: Secondary | ICD-10-CM | POA: Diagnosis present

## 2012-12-23 DIAGNOSIS — J309 Allergic rhinitis, unspecified: Secondary | ICD-10-CM

## 2012-12-23 DIAGNOSIS — T380X5A Adverse effect of glucocorticoids and synthetic analogues, initial encounter: Secondary | ICD-10-CM | POA: Diagnosis not present

## 2012-12-23 DIAGNOSIS — K219 Gastro-esophageal reflux disease without esophagitis: Secondary | ICD-10-CM | POA: Diagnosis present

## 2012-12-23 DIAGNOSIS — I10 Essential (primary) hypertension: Secondary | ICD-10-CM

## 2012-12-23 DIAGNOSIS — Z91199 Patient's noncompliance with other medical treatment and regimen due to unspecified reason: Secondary | ICD-10-CM

## 2012-12-23 DIAGNOSIS — Z79899 Other long term (current) drug therapy: Secondary | ICD-10-CM

## 2012-12-23 LAB — COMPREHENSIVE METABOLIC PANEL
ALT: 15 U/L (ref 0–35)
AST: 16 U/L (ref 0–37)
Albumin: 4 g/dL (ref 3.5–5.2)
Alkaline Phosphatase: 89 U/L (ref 39–117)
BUN: 7 mg/dL (ref 6–23)
CO2: 23 mEq/L (ref 19–32)
Calcium: 9.2 mg/dL (ref 8.4–10.5)
Chloride: 101 mEq/L (ref 96–112)
Creatinine, Ser: 0.62 mg/dL (ref 0.50–1.10)
GFR calc Af Amer: >90 mL/min (ref >90)
GFR calc non Af Amer: >90 mL/min (ref >90)
Glucose, Bld: 129 mg/dL — ABNORMAL HIGH (ref 70–99)
Potassium: 3.2 mEq/L — ABNORMAL LOW (ref 3.5–5.1)
Sodium: 138 mEq/L (ref 135–145)
Total Bilirubin: 0.3 mg/dL (ref 0.3–1.2)
Total Protein: 7.3 g/dL (ref 6.0–8.3)

## 2012-12-23 LAB — CBC WITH DIFFERENTIAL/PLATELET
Basophils Absolute: 0 10*3/uL (ref 0.0–0.1)
Basophils Relative: 0 % (ref 0–1)
Eosinophils Absolute: 0.1 10*3/uL (ref 0.0–0.7)
Eosinophils Relative: 0 % (ref 0–5)
HCT: 36.8 % (ref 36.0–46.0)
Hemoglobin: 12.6 g/dL (ref 12.0–15.0)
Lymphocytes Relative: 9 % — ABNORMAL LOW (ref 12–46)
Lymphs Abs: 1.1 10*3/uL (ref 0.7–4.0)
MCH: 30.5 pg (ref 26.0–34.0)
MCHC: 34.2 g/dL (ref 30.0–36.0)
MCV: 89.1 fL (ref 78.0–100.0)
Monocytes Absolute: 0.3 10*3/uL (ref 0.1–1.0)
Monocytes Relative: 3 % (ref 3–12)
Neutro Abs: 10.3 10*3/uL — ABNORMAL HIGH (ref 1.7–7.7)
Neutrophils Relative %: 88 % — ABNORMAL HIGH (ref 43–77)
Platelets: 255 10*3/uL (ref 150–400)
RBC: 4.13 MIL/uL (ref 3.87–5.11)
RDW: 13.4 % (ref 11.5–15.5)
WBC: 11.7 10*3/uL — ABNORMAL HIGH (ref 4.0–10.5)

## 2012-12-23 LAB — MRSA PCR SCREENING: MRSA by PCR: NEGATIVE

## 2012-12-23 LAB — PRO B NATRIURETIC PEPTIDE: Pro B Natriuretic peptide (BNP): 41.3 pg/mL (ref 0–125)

## 2012-12-23 LAB — LIPASE, BLOOD: Lipase: 19 U/L (ref 11–59)

## 2012-12-23 LAB — AMYLASE: Amylase: 42 U/L (ref 0–105)

## 2012-12-23 MED ORDER — KCL IN DEXTROSE-NACL 40-5-0.45 MEQ/L-%-% IV SOLN
INTRAVENOUS | Status: DC
  Administered 2012-12-23: 21:00:00 via INTRAVENOUS
  Filled 2012-12-23 (×2): qty 1000

## 2012-12-23 MED ORDER — POTASSIUM CHLORIDE CRYS ER 20 MEQ PO TBCR
40.0000 meq | EXTENDED_RELEASE_TABLET | Freq: Once | ORAL | Status: AC
  Administered 2012-12-23: 40 meq via ORAL
  Filled 2012-12-23: qty 2

## 2012-12-23 MED ORDER — MAGNESIUM SULFATE 50 % IJ SOLN: 2.0000 g | Freq: Once | INTRAMUSCULAR | Status: DC

## 2012-12-23 MED ORDER — MAGNESIUM SULFATE 40 MG/ML IJ SOLN
2.0000 g | Freq: Once | INTRAMUSCULAR | Status: AC
  Administered 2012-12-23: 2 g via INTRAVENOUS
  Filled 2012-12-23: qty 50

## 2012-12-23 MED ORDER — HEPARIN SODIUM (PORCINE) 5000 UNIT/ML IJ SOLN
5000.0000 [IU] | Freq: Three times a day (TID) | INTRAMUSCULAR | Status: DC
  Administered 2012-12-23 – 2012-12-29 (×17): 5000 [IU] via SUBCUTANEOUS
  Filled 2012-12-23 (×21): qty 1

## 2012-12-23 MED ORDER — METHYLPREDNISOLONE SODIUM SUCC 125 MG IJ SOLR
80.0000 mg | Freq: Three times a day (TID) | INTRAMUSCULAR | Status: DC
  Administered 2012-12-23 – 2012-12-24 (×2): 80 mg via INTRAVENOUS
  Filled 2012-12-23 (×5): qty 1.28

## 2012-12-23 MED ORDER — IPRATROPIUM BROMIDE 0.02 % IN SOLN
0.5000 mg | RESPIRATORY_TRACT | Status: DC
  Administered 2012-12-23 – 2012-12-24 (×5): 0.5 mg via RESPIRATORY_TRACT
  Filled 2012-12-23 (×4): qty 2.5

## 2012-12-23 MED ORDER — ALBUTEROL SULFATE (5 MG/ML) 0.5% IN NEBU
2.5000 mg | INHALATION_SOLUTION | RESPIRATORY_TRACT | Status: DC
  Administered 2012-12-23 – 2012-12-24 (×5): 2.5 mg via RESPIRATORY_TRACT
  Filled 2012-12-23 (×4): qty 0.5

## 2012-12-23 MED ORDER — FUROSEMIDE 10 MG/ML IJ SOLN
60.0000 mg | Freq: Once | INTRAMUSCULAR | Status: AC
  Administered 2012-12-23: 60 mg via INTRAVENOUS
  Filled 2012-12-23: qty 8

## 2012-12-23 MED ORDER — BISOPROLOL FUMARATE 5 MG PO TABS
2.5000 mg | ORAL_TABLET | Freq: Every day | ORAL | Status: DC
  Administered 2012-12-24 – 2012-12-29 (×6): 2.5 mg via ORAL
  Filled 2012-12-23 (×6): qty 0.5

## 2012-12-23 MED ORDER — IPRATROPIUM BROMIDE 0.02 % IN SOLN
0.5000 mg | Freq: Once | RESPIRATORY_TRACT | Status: AC
  Administered 2012-12-23: 0.5 mg via RESPIRATORY_TRACT
  Filled 2012-12-23: qty 2.5

## 2012-12-23 MED ORDER — DIPHENHYDRAMINE HCL 25 MG PO CAPS
25.0000 mg | ORAL_CAPSULE | Freq: Once | ORAL | Status: AC
  Administered 2012-12-23: 25 mg via ORAL
  Filled 2012-12-23: qty 1

## 2012-12-23 MED ORDER — ALBUTEROL (5 MG/ML) CONTINUOUS INHALATION SOLN
15.0000 mg | INHALATION_SOLUTION | Freq: Once | RESPIRATORY_TRACT | Status: AC
  Administered 2012-12-23: 15 mg via RESPIRATORY_TRACT

## 2012-12-23 MED ORDER — PANTOPRAZOLE SODIUM 40 MG PO TBEC
40.0000 mg | DELAYED_RELEASE_TABLET | Freq: Two times a day (BID) | ORAL | Status: DC
  Administered 2012-12-24: 40 mg via ORAL
  Filled 2012-12-23 (×2): qty 1

## 2012-12-23 MED ORDER — LEVOFLOXACIN IN D5W 750 MG/150ML IV SOLN
750.0000 mg | INTRAVENOUS | Status: DC
  Administered 2012-12-23: 750 mg via INTRAVENOUS
  Filled 2012-12-23: qty 150

## 2012-12-23 NOTE — Progress Notes (Signed)
Medicaid pt who [facility] ED Cm saw when noted no pcp listed Pt states she previously was seen by [facility] and does not have a pcp presently CM provided with a list of medicaid providers to assist with her having DSS to assign a new pcp Pt agreed to a referral to [facility] liaison to attempt to assist with expired orange card and possible available pcp Referral completed for pt

## 2012-12-23 NOTE — Progress Notes (Signed)
Offered support. Patient requested blanket. Brought her one. Listening; presence. Will continue to follow for support.

## 2012-12-23 NOTE — ED Notes (Signed)
Per EMS, Pt, from home, c/o SOB since last night.  Denies pain.  Vitals are stable.  In route, Pt given 10mg  Albuterol, Atrovent and 125mg  Solumedrol.  NAD noted.  Pt sts she took treatment at home.

## 2012-12-23 NOTE — Progress Notes (Signed)
Pt has a letter to confirm medicaid coverage with a pending medicaid card  Seen by [facility] liaison

## 2012-12-23 NOTE — ED Notes (Signed)
JWJ:[id number]<NW> Expected date:12/23/12<BR> Expected time:11:36 AM<BR> Means of arrival:Ambulance<BR> Comments:<BR> SHOB

## 2012-12-23 NOTE — ED Notes (Signed)
MD at bedside. Dr. Knapp at bedside.  

## 2012-12-23 NOTE — H&P (Addendum)
PULMONARY  / CRITICAL CARE MEDICINE  Name: [name] MRN: [id number] DOB: 1961/10/12    LOS: 0  REFERRING MD :  Knapp/ ER  CHIEF COMPLAINT:  sob  BRIEF PATIENT DESCRIPTION: 52 yobf prev smoker, remote pt of [place], ran out of advair x 3 month pta with increased saba dependency since acutely worse sob since 1/5 assoc with rhinorrhea, ear stuffiness, frontal headache, chills, myalgias, cough with yellow mucus production, wheezing, nausea and posttussive vomiting without diarrhea.  Came to [facility] er noon 1/7 and required bipap so pccm asked to admit  At baseline Sleeping ok without nocturnal  or early am exacerbation  of respiratory  c/o's or need for noct saba. Also denies any obvious fluctuation of symptoms with weather or environmental changes or other aggravating or alleviating factors except as outlined above   ROS  The following are not active complaints unless bolded sore throat, dysphagia, dental problems, itching, sneezing,  nasal congestion or excess/ purulent secretions, ear ache,   fever, chills, sweats, unintended wt loss, pleuritic or exertional cp, hemoptysis,  orthopnea pnd or leg swelling, presyncope, palpitations, heartburn, abdominal pain, anorexia, nausea, vomiting, diarrhea  or change in bowel or urinary habits, change in stools or urine, dysuria,hematuria,  rash, arthralgias both hips, not better on meloxicam, visual complaints, headache, numbness weakness or ataxia or problems with walking or coordination,  change in mood/affect or memory.             ANTIBIOTICS: Levaquin 1/7 >>>  SIGNIFICANT EVENTS:     LEVEL OF CARE:  Step down PRIMARY SERVICE:  PCCm CONSULTANTS:  None  CODE STATUS full DIET:  Clear liquid DVT Px:  hep GI Px:  ppi     PAST MEDICAL HISTORY :  Past Medical History  Diagnosis Date  . Asthma   . Anxiety   . COPD (chronic obstructive pulmonary disease)   . Angina   . Shortness of breath   . Depression   . Hypertension   . GERD  (gastroesophageal reflux disease)   . Headache   . Arthritis   . Peripheral vascular disease   . Migraine    Past Surgical History  Procedure Date  . Neck surgery   . Toe surgery   . Abdominal hysterectomy   . Tubal ligation   . Kidney stone surgery   . Hemorrhoid surgery   . Incise and drain abcess   . Back surgery   . Diagnostic laparoscopy   . Colonoscopy 12/20/2011    Procedure: COLONOSCOPY;  Surgeon: Freddy Jaksch, MD;  Location: [facility];  Service: Endoscopy;  Laterality: N/A;  . Colonoscopy 03/05/2012    Procedure: COLONOSCOPY;  Surgeon: Freddy Jaksch, MD;  Location: [facility];  Service: Endoscopy;  Laterality: N/A;   Prior to Admission medications   Medication Sig Start Date End Date Taking? Authorizing Provider  albuterol (PROVENTIL HFA;VENTOLIN HFA) 108 (90 BASE) MCG/ACT inhaler Inhale 1-2 puffs into the lungs every 6 (six) hours as needed for wheezing. 08/10/12 08/10/13 Yes Trevor Mace, PA-C  albuterol (PROVENTIL) (5 MG/ML) 0.5% nebulizer solution Take 2.5 mg by nebulization at bedtime as needed. As needed for shortness of breath    Yes Historical Provider, MD  albuterol-ipratropium (COMBIVENT) 18-103 MCG/ACT inhaler Inhale 2 puffs into the lungs every 4 (four) hours as needed. For shortness of breath.   Yes Historical Provider, MD  aspirin 325 MG EC tablet Take 325 mg by mouth daily.   Yes Historical Provider, MD  atenolol (TENORMIN)  50 MG tablet Take 50 mg by mouth daily.   Yes Historical Provider, MD  citalopram (CELEXA) 10 MG tablet Take 20 mg by mouth daily.    Yes Historical Provider, MD  diphenhydrAMINE (BENADRYL) 25 MG tablet Take 25 mg by mouth every 6 (six) hours as needed. As needed for sleep and/or itching    Yes Historical Provider, MD  ergocalciferol (VITAMIN D2) 50000 UNITS capsule Take 50,000 Units by mouth once a week.   Yes Historical Provider, MD  Fluticasone-Salmeterol (ADVAIR DISKUS) 500-50 MCG/DOSE AEPB Inhale 1 puff into the lungs daily.     Yes Historical Provider, MD  hydrochlorothiazide (HYDRODIURIL) 25 MG tablet Take 25 mg by mouth daily.   Yes Historical Provider, MD  meloxicam (MOBIC) 7.5 MG tablet Take 7.5 mg by mouth daily.   Yes Historical Provider, MD  methocarbamol (ROBAXIN) 500 MG tablet Take 500 mg by mouth 4 (four) times daily.   Yes Historical Provider, MD  oxycodone (OXY-IR) 5 MG capsule Take 5 mg by mouth every 4 (four) hours as needed. pain   Yes Historical Provider, MD  predniSONE (DELTASONE) 10 MG tablet Take 10 mg by mouth daily.   Yes Historical Provider, MD  traMADol (ULTRAM) 50 MG tablet Take 50 mg by mouth every 6 (six) hours as needed. For pain.   Yes Historical Provider, MD   Allergies  Allergen Reactions  . Sulfa Antibiotics Nausea And Vomiting  . Sulfonamide Derivatives Nausea And Vomiting    REACTION: n/v    FAMILY HISTORY:  Family History  Problem Relation Age of Onset  . Diabetes Mother   . COPD Mother   . Heart disease Mother   . Diabetes Father   . Kidney disease Father   . Cancer Father   . Anesthesia problems Father   . Diabetes Sister   . Hypertension Sister   . Diabetes Brother   . Sleep apnea Brother   . Heart disease Sister   . Diabetes Sister   . Heart disease Brother    SOCIAL HISTORY:  reports that she quit smoking about 20 months ago. She has never used smokeless tobacco. She reports that she does not drink alcohol or use illicit drugs.    VITAL SIGNS: Pulse Rate:  [107-122] 112  (01/07 1603) Resp:  [18-28] 23  (01/07 1603) BP: (115-150)/(63-83) 117/63 mmHg (01/07 1603) SpO2:  [92 %-100 %] 97 % (01/07 1603) FIO2 35% with bipap   HEMODYNAMICS:   VENTILATOR SETTINGS:   INTAKE / OUTPUT: Intake/Output    None     PHYSICAL EXAMINATION: General: acutely and chronically ill HEENT mild turbinate edema.  Oropharynx no thrush or excess pnd or cobblestoning.  No JVD or cervical adenopathy. Mild accessory muscle hypertrophy. Trachea midline, nl thryroid. Chest was  hyperinflated by percussion with diminished breath sounds and moderate increased exp time without wheeze. Hoover sign positive at mid inspiration. Regular rate and rhythm without murmur gallop or rub or increase P2 or edema.  Abd: no hsm, nl excursion. Ext warm without cyanosis or clubbing.    LABS: Cbc  Lab 12/23/12 1300  WBC 11.7*  HGB 12.6  HCT 36.8  PLT 255    Chemistry   Lab 12/23/12 1300  NA 138  K 3.2*  CL 101  CO2 23  BUN 7  CREATININE 0.62  CALCIUM 9.2  MG --  PHOS --  GLUCOSE 129*    Liver fxn  Lab 12/23/12 1300  AST 16  ALT 15  ALKPHOS 89  BILITOT 0.3  PROT 7.3  ALBUMIN 4.0   coags No results found for this basename: APTT:3,INR:3 in the last 168 hours Sepsis markers No results found for this basename: LATICACIDVEN:3,PROCALCITON:3 in the last 168 hours Cardiac markers No results found for this basename: CKTOTAL:3,CKMB:3,TROPONINI:3 in the last 168 hours BNP  Lab 12/23/12 1502  PROBNP 41.3   ABG No results found for this basename: PHART:3,PCO2ART:3,PO2ART:3,HCO3:3,TCO2:3 in the last 168 hours  CBG trend No results found for this basename: GLUCAP:5 in the last 168 hours  IMAGING:  ECG:  DIAGNOSES: Active Problems:  COPD  HYPERTENSION  Hypokalemia   ASSESSMENT / PLAN:  PULMONARY  1) aecopd with ab in setting of non compliance > acute resp failure/ bipap and 02 dep P Admit SD BD and IV steroids try off bipap due to reported N and V prior to admit       CARDIOVASCULAR  2) HBP on atenolol P  Strongly prefer in this setting: Bystolic, the most beta -1  selective Beta blocker available in sample form, with bisoprolol the most selective generic choice  on the market.     RENAL   3) Mild hypokalemia Correct and monitor  GASTROINTESTINAL   4) N and V ? Viral > clear liquids P check amylase and lipase/ clear liquid/ prn zofran     INFECTIOUS  5) ? Acute tracheobronchitis s pna  P  Levaquin per dashboard    I have  personally obtained a history, examined the patient, evaluated laboratory and imaging results, formulated the assessment and plan and placed orders. CRITICAL CARE: The patient is critically ill with multiple organ systems failure and requires high complexity decision making for assessment and support, frequent evaluation and titration of therapies, application of advanced monitoring technologies and extensive interpretation of multiple databases. Critical Care Time devoted to patient care services described in this note is 45 minutes.    Pulmonary and Critical Care Medicine [facility] Pager: [phone]  12/23/2012, 4:40 PM

## 2012-12-23 NOTE — ED Provider Notes (Signed)
History     CSN: [id number]  Arrival date & time 12/23/12  1145   First MD Initiated Contact with Patient 12/23/12 1211      Chief Complaint  Patient presents with  . Shortness of Breath    (Consider location/radiation/quality/duration/timing/severity/associated sxs/prior treatment) HPI  Patient presents via EMS. She reports 2 nights ago she started having rhinorrhea, ear stuffiness, frontal headache, chills, myalgias, cough with yellow mucus production, wheezing, nausea and posttussive vomiting without diarrhea. She also reports her chest feels tight. She states she has been using her nebulizer for her wheezing however it only helps a short while and she starts wheezing again. She reports EMS gave her albuterol 10 mg with 0.5 mg and Solu-Medrol 125 mg which she states helped a lot.  Patient states she has had this before and states she was last admitted about a year ago and was in ICU about 5 days.  [facility] no current doctor  Past Medical History  Diagnosis Date  . Asthma   . Anxiety   . COPD (chronic obstructive pulmonary disease)   . Angina   . Shortness of breath   . Depression   . Hypertension   . GERD (gastroesophageal reflux disease)   . Headache   . Arthritis   . Peripheral vascular disease   . Migraine     Past Surgical History  Procedure Date  . Neck surgery   . Toe surgery   . Abdominal hysterectomy   . Tubal ligation   . Kidney stone surgery   . Hemorrhoid surgery   . Incise and drain abcess   . Back surgery   . Diagnostic laparoscopy   . Colonoscopy 12/20/2011    Procedure: COLONOSCOPY;  Surgeon: Freddy Jaksch, MD;  Location: [facility];  Service: Endoscopy;  Laterality: N/A;  . Colonoscopy 03/05/2012    Procedure: COLONOSCOPY;  Surgeon: Freddy Jaksch, MD;  Location: [facility];  Service: Endoscopy;  Laterality: N/A;    Family History  Problem Relation Age of Onset  . Diabetes Mother   . COPD Mother   . Heart disease Mother     . Diabetes Father   . Kidney disease Father   . Cancer Father   . Anesthesia problems Father   . Diabetes Sister   . Hypertension Sister   . Diabetes Brother   . Sleep apnea Brother   . Heart disease Sister   . Diabetes Sister   . Heart disease Brother     History  Substance Use Topics  . Smoking status: Former Smoker    Quit date: 04/20/2011  . Smokeless tobacco: Not on file  . Alcohol Use: No  unemployed, waiting for disability  OB History    Grav Para Term Preterm Abortions TAB SAB Ect Mult Living   7 3 3  4 1 3   3       Review of Systems  All other systems reviewed and are negative.    Allergies  Sulfa antibiotics and Sulfonamide derivatives  Home Medications   Current Outpatient Rx  Name  Route  Sig  Dispense  Refill  . ALBUTEROL SULFATE HFA 108 (90 BASE) MCG/ACT IN AERS   Inhalation   Inhale 1-2 puffs into the lungs every 6 (six) hours as needed for wheezing.   1 Inhaler   0   . ALBUTEROL SULFATE (5 MG/ML) 0.5% IN NEBU   Nebulization   Take 2.5 mg by nebulization at bedtime as needed. As needed for  shortness of breath          . IPRATROPIUM-ALBUTEROL 18-103 MCG/ACT IN AERO   Inhalation   Inhale 2 puffs into the lungs every 4 (four) hours as needed. For shortness of breath.         . ASPIRIN-ACETAMINOPHEN-CAFFEINE 250-250-65 MG PO TABS   Oral   Take 3 tablets by mouth every 6 (six) hours as needed. For migraines.         . BC HEADACHE POWDER PO   Oral   Take 1 Package by mouth as needed. For headaches.         Marland Kitchen CITALOPRAM HYDROBROMIDE 10 MG PO TABS   Oral   Take 10 mg by mouth daily.           Marland Kitchen DIPHENHYDRAMINE HCL 25 MG PO TABS   Oral   Take 25 mg by mouth every 6 (six) hours as needed. As needed for sleep and/or itching          . FLUTICASONE-SALMETEROL 500-50 MCG/DOSE IN AEPB   Inhalation   Inhale 1 puff into the lungs daily.          Marland Kitchen FLUTICASONE-SALMETEROL 500-50 MCG/DOSE IN AEPB   Inhalation   Inhale 1 puff into  the lungs 2 (two) times daily.   60 each   0   . METHOCARBAMOL 500 MG PO TABS   Oral   Take 500 mg by mouth 4 (four) times daily.         . TRAMADOL HCL 50 MG PO TABS   Oral   Take 50 mg by mouth every 6 (six) hours as needed. For pain.         . VENLAFAXINE HCL ER 150 MG PO CP24   Oral   Take 150 mg by mouth daily.           BP 150/82  Pulse 120  Resp 24  SpO2 100%  Vital signs normal tachypnea   Physical Exam  Nursing note and vitals reviewed. Constitutional: She is oriented to person, place, and time. She appears well-developed and well-nourished.  Non-toxic appearance. She does not appear ill. No distress.  HENT:  Head: Normocephalic and atraumatic.  Right Ear: External ear normal.  Left Ear: External ear normal.  Nose: Nose normal. No mucosal edema or rhinorrhea.  Mouth/Throat: Oropharynx is clear and moist and mucous membranes are normal. No dental abscesses or uvula swelling.  Eyes: Conjunctivae normal and EOM are normal. Pupils are equal, round, and reactive to light.  Neck: Normal range of motion and full passive range of motion without pain. Neck supple.  Cardiovascular: Normal rate, regular rhythm and normal heart sounds.  Exam reveals no gallop and no friction rub.   No murmur heard. Pulmonary/Chest: Accessory muscle usage present. She is in respiratory distress. She has decreased breath sounds. She has no wheezes. She has no rhonchi. She has no rales. She exhibits no tenderness and no crepitus.       Patient has scattered wheezings and sometimes she has audible expiratory wheezing. She is noted to have some retractions  Abdominal: Soft. Normal appearance and bowel sounds are normal. She exhibits no distension. There is no tenderness. There is no rebound and no guarding.  Musculoskeletal: Normal range of motion. She exhibits no edema and no tenderness.       Moves all extremities well.   Neurological: She is alert and oriented to person, place, and time.  She has normal strength. No cranial nerve deficit.  Skin: Skin is warm, dry and intact. No rash noted. No erythema. No pallor.  Psychiatric: She has a normal mood and affect. Her speech is normal and behavior is normal. Her mood appears not anxious.    ED Course  Procedures (including critical care time)   Medications  albuterol (PROVENTIL,VENTOLIN) solution continuous neb (15 mg Nebulization Given 12/23/12 1255)  ipratropium (ATROVENT) nebulizer solution 0.5 mg (0.5 mg Nebulization Given 12/23/12 1255)  magnesium sulfate IVPB 2 g 50 mL (0 g Intravenous Stopped 12/23/12 1519)  furosemide (LASIX) injection 60 mg (60 mg Intravenous Given 12/23/12 1525)   Recheck 15:50 still getting continous neb but still has diffuse expir wheezing. Have discussed she will need to be admitted and she is agreeable.   Recheck 14:55 has finished her continous nebulizer (15 mg of albuterol) still has diffuse end expir wheezes, appears tight with retractions. RR 32. Magnesium IV is almost gone in.  Pt ageeable to trying BiPap.   BiPap started by respiratory therapy  15:46 Dr Waymon Amato, wants me to discuss with critical care  15:53 Dr Sung Amabile will have Dr Sherene Sires admit  16:00 pt on BiPap, states helping alittle  Results for orders placed during the hospital encounter of 12/23/12  CBC WITH DIFFERENTIAL      Component Value Range   WBC 11.7 (*) 4.0 - 10.5 K/uL   RBC 4.13  3.87 - 5.11 MIL/uL   Hemoglobin 12.6  12.0 - 15.0 g/dL   HCT 16.1  09.6 - 04.5 %   MCV 89.1  78.0 - 100.0 fL   MCH 30.5  26.0 - 34.0 pg   MCHC 34.2  30.0 - 36.0 g/dL   RDW 40.9  81.1 - 91.4 %   Platelets 255  150 - 400 K/uL   Neutrophils Relative 88 (*) 43 - 77 %   Neutro Abs 10.3 (*) 1.7 - 7.7 K/uL   Lymphocytes Relative 9 (*) 12 - 46 %   Lymphs Abs 1.1  0.7 - 4.0 K/uL   Monocytes Relative 3  3 - 12 %   Monocytes Absolute 0.3  0.1 - 1.0 K/uL   Eosinophils Relative 0  0 - 5 %   Eosinophils Absolute 0.1  0.0 - 0.7 K/uL   Basophils Relative 0   0 - 1 %   Basophils Absolute 0.0  0.0 - 0.1 K/uL  COMPREHENSIVE METABOLIC PANEL      Component Value Range   Sodium 138  135 - 145 mEq/L   Potassium 3.2 (*) 3.5 - 5.1 mEq/L   Chloride 101  96 - 112 mEq/L   CO2 23  19 - 32 mEq/L   Glucose, Bld 129 (*) 70 - 99 mg/dL   BUN 7  6 - 23 mg/dL   Creatinine, Ser 7.82  0.50 - 1.10 mg/dL   Calcium 9.2  8.4 - 95.6 mg/dL   Total Protein 7.3  6.0 - 8.3 g/dL   Albumin 4.0  3.5 - 5.2 g/dL   AST 16  0 - 37 U/L   ALT 15  0 - 35 U/L   Alkaline Phosphatase 89  39 - 117 U/L   Total Bilirubin 0.3  0.3 - 1.2 mg/dL   GFR calc non Af Amer >90  >90 mL/min   GFR calc Af Amer >90  >90 mL/min  PRO B NATRIURETIC PEPTIDE      Component Value Range   Pro B Natriuretic peptide (BNP) 41.3  0 - 125 pg/mL   Laboratory interpretation all normal  except hypokalemia, leukocytosis (on steroids)    Dg Chest 2 View  12/23/2012  *RADIOLOGY REPORT*  Clinical Data: Shortness of breath, the patient states has asthma  CHEST - 2 VIEW  Comparison:  08/10/2012  Findings: The heart size is normal.  There is very mild perihilar vascular congestion bilaterally when compared to the prior study, but there is no pulmonary edema. There is no infiltrate or effusion.  IMPRESSION: Very mild vascular congestion.  This represents a change when compared to the prior study.  The study is otherwise negative.   Original Report Authenticated By: Esperanza Heir, M.D.       Date: 12/23/2012  Rate: 118  Rhythm: sinus tachycardia  QRS Axis: normal  Intervals: normal  ST/T Wave abnormalities: normal  Conduction Disutrbances:none  Narrative Interpretation:   Old EKG Reviewed: changes noted from 08/10/2012 HR was 66   1. Status asthmaticus   2. Hypokalemia    Plan admission   Devoria Albe, MD, FACEP   CRITICAL CARE Performed by: Devoria Albe L   Total critical care time: 40 min   Critical care time was exclusive of separately billable procedures and treating other patients.  Critical  care was necessary to treat or prevent imminent or life-threatening deterioration.  Critical care was time spent personally by me on the following activities: development of treatment plan with patient and/or surrogate as well as nursing, discussions with consultants, evaluation of patient's response to treatment, examination of patient, obtaining history from patient or surrogate, ordering and performing treatments and interventions, ordering and review of laboratory studies, ordering and review of radiographic studies, pulse oximetry and re-evaluation of patient's condition.    MDM          Ward Givens, MD 12/23/12 [phone]

## 2012-12-23 NOTE — ED Notes (Signed)
RT at bedside for BiPap placement.

## 2012-12-23 NOTE — ED Notes (Signed)
MD at bedside. 

## 2012-12-24 LAB — BASIC METABOLIC PANEL
BUN: 10 mg/dL (ref 6–23)
CO2: 26 mEq/L (ref 19–32)
Calcium: 9.6 mg/dL (ref 8.4–10.5)
Chloride: 99 mEq/L (ref 96–112)
Creatinine, Ser: 0.65 mg/dL (ref 0.50–1.10)
GFR calc Af Amer: >90 mL/min (ref >90)
GFR calc non Af Amer: >90 mL/min (ref >90)
Glucose, Bld: 169 mg/dL — ABNORMAL HIGH (ref 70–99)
Potassium: 4.5 mEq/L (ref 3.5–5.1)
Sodium: 135 mEq/L (ref 135–145)

## 2012-12-24 LAB — INFLUENZA PANEL BY PCR (TYPE A & B)
H1N1 flu by pcr: NOT DETECTED
Influenza A By PCR: NEGATIVE
Influenza B By PCR: NEGATIVE

## 2012-12-24 MED ORDER — ASPIRIN EC 325 MG PO TBEC
325.0000 mg | DELAYED_RELEASE_TABLET | Freq: Every day | ORAL | Status: DC
  Administered 2012-12-24 – 2012-12-29 (×6): 325 mg via ORAL
  Filled 2012-12-24 (×6): qty 1

## 2012-12-24 MED ORDER — IPRATROPIUM BROMIDE 0.02 % IN SOLN: 0.5000 mg | RESPIRATORY_TRACT | Status: DC

## 2012-12-24 MED ORDER — CITALOPRAM HYDROBROMIDE 20 MG PO TABS
20.0000 mg | ORAL_TABLET | Freq: Every day | ORAL | Status: DC
  Administered 2012-12-24 – 2012-12-29 (×6): 20 mg via ORAL
  Filled 2012-12-24 (×6): qty 1

## 2012-12-24 MED ORDER — ACETAMINOPHEN 325 MG PO TABS
650.0000 mg | ORAL_TABLET | Freq: Four times a day (QID) | ORAL | Status: DC | PRN
  Administered 2012-12-24 – 2012-12-25 (×4): 650 mg via ORAL
  Filled 2012-12-24 (×4): qty 2

## 2012-12-24 MED ORDER — ALBUTEROL SULFATE (5 MG/ML) 0.5% IN NEBU
2.5000 mg | INHALATION_SOLUTION | Freq: Four times a day (QID) | RESPIRATORY_TRACT | Status: DC
  Administered 2012-12-24 – 2012-12-29 (×20): 2.5 mg via RESPIRATORY_TRACT
  Filled 2012-12-24 (×20): qty 0.5

## 2012-12-24 MED ORDER — METHYLPREDNISOLONE SODIUM SUCC 40 MG IJ SOLR
40.0000 mg | Freq: Two times a day (BID) | INTRAMUSCULAR | Status: DC
  Administered 2012-12-24 – 2012-12-28 (×8): 40 mg via INTRAVENOUS
  Filled 2012-12-24 (×10): qty 1

## 2012-12-24 MED ORDER — IPRATROPIUM BROMIDE 0.02 % IN SOLN
0.5000 mg | Freq: Four times a day (QID) | RESPIRATORY_TRACT | Status: DC
  Administered 2012-12-24 – 2012-12-29 (×20): 0.5 mg via RESPIRATORY_TRACT
  Filled 2012-12-24 (×20): qty 2.5

## 2012-12-24 MED ORDER — ALBUTEROL SULFATE (5 MG/ML) 0.5% IN NEBU
INHALATION_SOLUTION | RESPIRATORY_TRACT | Status: AC
  Filled 2012-12-24: qty 0.5

## 2012-12-24 MED ORDER — ALBUTEROL SULFATE (5 MG/ML) 0.5% IN NEBU: 2.5000 mg | INHALATION_SOLUTION | Freq: Four times a day (QID) | RESPIRATORY_TRACT | Status: DC

## 2012-12-24 MED ORDER — LEVOFLOXACIN 500 MG PO TABS
500.0000 mg | ORAL_TABLET | Freq: Every day | ORAL | Status: AC
  Administered 2012-12-24 – 2012-12-28 (×5): 500 mg via ORAL
  Filled 2012-12-24 (×5): qty 1

## 2012-12-24 MED ORDER — ONDANSETRON HCL 4 MG/2ML IJ SOLN: 4.0000 mg | Freq: Four times a day (QID) | INTRAMUSCULAR | Status: DC | PRN

## 2012-12-24 NOTE — H&P (Signed)
PULMONARY  / CRITICAL CARE MEDICINE  Name: [name] MRN: [id number] DOB: 11/26/1961    LOS: 1  REFERRING MD :  Knapp/ ER  CHIEF COMPLAINT:  sob  BRIEF PATIENT DESCRIPTION:  52 yo female former smoker admitted 12/23/2012 increased dyspnea, rhinorrhea, headache, chills, myalgias, cough with purulent sputum, nausea, vomiting from AECOPD.  Initially needed BiPAP in ER, and PCCM asked to admitted.  Previously followed by Dr. Delton Coombes as outpt.  Difficulty with medication refills/follow up due to lack of insurance.  Significant PMHx of COPD/asthma, Depression, HTN, GERD, Headaches, PVD, Arthritis   ANTIBIOTICS: Levaquin 1/7 >>>  CULTURES: Influenza PCR 1/07 >> negative  EVENTS:   TESTS: 06/15/09 PFT >> FEV1 1.11 (44%), FEV1% 48, TLC 4.46 (93%), DLCO 48%, +BD 10/22/11 Echo >> EF 55 to 60%  SUBJECTIVE: Still has cough, wheeze.  Breathing improved.  Gets dizzy when walking to bathroom.  VITAL SIGNS: Temp:  [98 F (36.7 C)-99.1 F (37.3 C)] 98 F (36.7 C) (01/08 0400) Pulse Rate:  [70-122] 70  (01/08 0800) Resp:  [13-28] 13  (01/08 0800) BP: (112-152)/(60-98) 112/78 mmHg (01/08 0800) SpO2:  [92 %-100 %] 98 % (01/08 0800) FiO2 (%):  [35 %] 35 % (01/07 1718) Weight:  [169 lb 1.5 oz (76.7 kg)-171 lb 15.3 oz (78 kg)] 171 lb 15.3 oz (78 kg) (01/08 0000) VENTILATOR SETTINGS: Vent Mode:  [-]  FiO2 (%):  [35 %] 35 % INTAKE / OUTPUT: Intake/Output      01/07 0701 - 01/08 0700 01/08 0701 - 01/09 0700   I.V. (mL/kg) 75 (1) 150 (1.9)   IV Piggyback 900    Total Intake(mL/kg) 975 (12.5) 150 (1.9)   Urine (mL/kg/hr) 2350 (1.3)    Total Output 2350    Net -1375 +150          PHYSICAL EXAMINATION: General: No distress, speaks in full sentences HEENT:  No sinus tenderness Chest : b/l wheeze Cardiac: s1s2 regular, no murmur Abd: soft, non tender Ext: no edema Neuro: normal strength  LABS: Cbc  Lab 12/23/12 1300  WBC 11.7*  HGB 12.6  HCT 36.8  PLT 255     Chemistry   Lab 12/23/12 1300  NA 138  K 3.2*  CL 101  CO2 23  BUN 7  CREATININE 0.62  CALCIUM 9.2  MG --  PHOS --  GLUCOSE 129*    Liver fxn  Lab 12/23/12 1300  AST 16  ALT 15  ALKPHOS 89  BILITOT 0.3  PROT 7.3  ALBUMIN 4.0   BNP  Lab 12/23/12 1502  PROBNP 41.3   IMAGING: Dg Chest 2 View  12/23/2012  *RADIOLOGY REPORT*  Clinical Data: Shortness of breath, the patient states has asthma  CHEST - 2 VIEW  Comparison:  08/10/2012  Findings: The heart size is normal.  There is very mild perihilar vascular congestion bilaterally when compared to the prior study, but there is no pulmonary edema. There is no infiltrate or effusion.  IMPRESSION: Very mild vascular congestion.  This represents a change when compared to the prior study.  The study is otherwise negative.   Original Report Authenticated By: Esperanza Heir, M.D.     ASSESSMENT / PLAN:  Acute COPD exacerbation. Plan: D/c BiPAP Oxygen as needed to keep SpO2 > 92% Bronchial hygiene D2/x levaquin Change solumedrol to 40 mg q12h Continue scheduled BD's Influenza PCR negative from 1/07 >> d/c droplet isolation   Hx of HTN, PVD. Plan: Continue bisoprolol >> the most cardioselective, generic beta  blocker available Continue ASA  Hypokalemia. Plan: F/u and replace electrolytes as needed  Nausea, vomiting, diarrhea. Amylase, lipase normal from 1/07.  Improved 1/08. Plan: Advance diet as tolerated PRN zofran  Depression. Plan: Continue celexa  Global: Lack of insurance coverage has been significant barrier to adequate outpt healthcare.  Will ask social worker to assist with this.  Will transfer to medical floor.  Coralyn Helling, MD [facility] Pulmonary/Critical Care 12/24/2012, 10:00 AM Pager:  [phone] After 3pm call: [phone]

## 2012-12-24 NOTE — Progress Notes (Signed)
CARE MANAGEMENT NOTE 12/24/2012  Patient:  [name], [name]   Account Number:  [id number]  Date Initiated:  12/24/2012  Documentation initiated by:  DAVIS,RHONDA  Subjective/Objective Assessment:   pt with hx of copd now with failed outpt treatment, requiring bipap in the ed,     Action/Plan:   from home should be able to return   Anticipated DC Date:  12/27/2012   Anticipated DC Plan:  HOME/SELF CARE  In-house referral  NA      DC Planning Services  NA      [facility] Choice  NA   Choice offered to / List presented to:  NA   DME arranged  NA      DME agency  NA     HH arranged  NA      HH agency  NA   Status of service:  In process, will continue to follow Medicare Important Message given?  NA - LOS <3 / Initial given by admissions (If response is "NO", the following Medicare IM given date fields will be blank) Date Medicare IM given:   Date Additional Medicare IM given:    Discharge Disposition:    Per UR Regulation:  Reviewed for med. necessity/level of care/duration of stay  If discussed at Long Length of Stay Meetings, dates discussed:    Comments:  [id number]/Rhonda Earlene Plater, RN, BSN, CCM: CHART REVIEWED AND UPDATED.  Next chart review due on [id number]. NO DISCHARGE NEEDS PRESENT AT THIS TIME. CASE MANAGEMENT [phone]

## 2012-12-24 NOTE — Progress Notes (Signed)
eLink Physician-Brief Progress Note Patient Name: TOKIKO DIEFENDERFER DOB: 05-02-1961 MRN: [id number]  Date of Service  12/24/2012   HPI/Events of Note   Headache  eICU Interventions  Plan: Order for prn tylenol   Intervention Category Minor Interventions: Routine modifications to care plan (e.g. PRN medications for pain, fever)  Jaxton Casale 12/24/2012, 2:57 AM

## 2012-12-24 NOTE — Progress Notes (Signed)
Received from ICU stepdown via wheelchair, alert and oriented in no apparent distress or complaints of pain.

## 2012-12-25 DIAGNOSIS — J441 Chronic obstructive pulmonary disease with (acute) exacerbation: Secondary | ICD-10-CM

## 2012-12-25 LAB — CBC
HCT: 38.4 % (ref 36.0–46.0)
Hemoglobin: 12.8 g/dL (ref 12.0–15.0)
MCH: 29.8 pg (ref 26.0–34.0)
MCHC: 33.3 g/dL (ref 30.0–36.0)
MCV: 89.3 fL (ref 78.0–100.0)
Platelets: 298 10*3/uL (ref 150–400)
RBC: 4.3 MIL/uL (ref 3.87–5.11)
RDW: 13.6 % (ref 11.5–15.5)
WBC: 32.8 10*3/uL — ABNORMAL HIGH (ref 4.0–10.5)

## 2012-12-25 LAB — MAGNESIUM: Magnesium: 2.4 mg/dL (ref 1.5–2.5)

## 2012-12-25 LAB — BASIC METABOLIC PANEL
BUN: 18 mg/dL (ref 6–23)
CO2: 25 mEq/L (ref 19–32)
Calcium: 9.7 mg/dL (ref 8.4–10.5)
Chloride: 102 mEq/L (ref 96–112)
Creatinine, Ser: 0.71 mg/dL (ref 0.50–1.10)
GFR calc Af Amer: >90 mL/min (ref >90)
GFR calc non Af Amer: >90 mL/min (ref >90)
Glucose, Bld: 144 mg/dL — ABNORMAL HIGH (ref 70–99)
Potassium: 4.8 mEq/L (ref 3.5–5.1)
Sodium: 135 mEq/L (ref 135–145)

## 2012-12-25 LAB — PHOSPHORUS: Phosphorus: 2.3 mg/dL (ref 2.3–4.6)

## 2012-12-25 NOTE — Progress Notes (Signed)
While walking in the hallway, her o2 sats were 92-94% on ra.

## 2012-12-25 NOTE — Progress Notes (Signed)
PULMONARY  / CRITICAL CARE MEDICINE  Name: [name] MRN: [id number] DOB: February 11, 1961    LOS: 2  REFERRING MD :  Knapp/ ER  CHIEF COMPLAINT:  sob  BRIEF PATIENT DESCRIPTION:  52 yo female former smoker admitted 12/23/2012 increased dyspnea, rhinorrhea, headache, chills, myalgias, cough with purulent sputum, nausea, vomiting from AECOPD.  Initially needed BiPAP in ER, and PCCM asked to admitted.  Previously followed by Dr. Delton Coombes as outpt.  Difficulty with medication refills/follow up due to lack of insurance.  Significant PMHx of COPD/asthma, Depression, HTN, GERD, Headaches, PVD, Arthritis   ANTIBIOTICS: Levaquin 1/7 > plan stop 1/12   CULTURES: Influenza PCR 1/07 >> negative  EVENTS:   TESTS: 06/15/09 PFT >> FEV1 1.11 (44%), FEV1% 48, TLC 4.46 (93%), DLCO 48%, +BD 10/22/11 Echo >> EF 55 to 60%  SUBJECTIVE: Still has cough, wheeze. SOB with short distances walking to bathroom.  VITAL SIGNS: Temp:  [97.8 F (36.6 C)-98.7 F (37.1 C)] 98.1 F (36.7 C) (01/09 1138) Pulse Rate:  [68-87] 68  (01/09 1138) Resp:  [18-20] 20  (01/09 1138) BP: (114-134)/(62-83) 134/83 mmHg (01/09 1138) SpO2:  [92 %-95 %] 95 % (01/09 1138) FiO2 (%):  [21 %] 21 % (01/08 1401)  VENTILATOR SETTINGS: Vent Mode:  [-]  FiO2 (%):  [21 %] 21 %  INTAKE / OUTPUT: Intake/Output      01/08 0701 - 01/09 0700 01/09 0701 - 01/10 0700   P.O. 600    I.V. (mL/kg) 150 (1.9)    IV Piggyback     Total Intake(mL/kg) 750 (9.6)    Urine (mL/kg/hr) 400 (0.2)    Total Output 400    Net +350         Urine Occurrence 1 x 1 x     PHYSICAL EXAMINATION: General: No distress, speaks in full sentences HEENT:  No sinus tenderness Chest : b/l wheeze Cardiac: s1s2 regular, no murmur Abd: soft, non tender Ext: no edema Neuro: normal strength  LABS: Cbc  Lab 12/25/12 0459 12/23/12 1300  WBC 32.8* --  HGB 12.8 12.6  HCT 38.4 36.8  PLT 298 255   Chemistry  Lab 12/25/12 0459 12/24/12 0900 12/23/12  1300  NA 135 135 138  K 4.8 4.5 3.2*  CL 102 99 101  CO2 25 26 23   BUN 18 10 7   CREATININE 0.71 0.65 0.62  CALCIUM 9.7 9.6 9.2  MG 2.4 -- --  PHOS 2.3 -- --  GLUCOSE 144* 169* 129*   Liver fxn  Lab 12/23/12 1300  AST 16  ALT 15  ALKPHOS 89  BILITOT 0.3  PROT 7.3  ALBUMIN 4.0   BNP  Lab 12/23/12 1502  PROBNP 41.3   IMAGING: Dg Chest 2 View  12/23/2012  *RADIOLOGY REPORT*  Clinical Data: Shortness of breath, the patient states has asthma  CHEST - 2 VIEW  Comparison:  08/10/2012  Findings: The heart size is normal.  There is very mild perihilar vascular congestion bilaterally when compared to the prior study, but there is no pulmonary edema. There is no infiltrate or effusion.  IMPRESSION: Very mild vascular congestion.  This represents a change when compared to the prior study.  The study is otherwise negative.   Original Report Authenticated By: Esperanza Heir, M.D.     ASSESSMENT / PLAN:  Acute COPD exacerbation. Leukocytosis - likely related to steroid use  Plan: Oxygen as needed to keep SpO2 > 92% Bronchial hygiene Levaquin per dashboard =5 days total for aecopd s  pna Solumedrol to 40 mg q12h Continue scheduled BD's   Hx of HTN, PVD.  Plan: Continue bisoprolol >> the most cardioselective, generic beta blocker available Continue ASA  Hypokalemia.  Plan: F/u and replace electrolytes as needed  Nausea, vomiting, diarrhea. Amylase, lipase normal from 1/07.  Improved 1/08.  Plan: Advance diet as tolerated PRN zofran  Depression.  Plan: Continue celexa  Global: Lack of insurance coverage has been significant barrier to adequate outpt healthcare.  Will ask social worker to assist with this.   Sandrea Hughs, MD Pulmonary and Critical Care Medicine [facility] Cell [phone] After 5:30 PM or weekends, call [phone]

## 2012-12-25 NOTE — Progress Notes (Signed)
After walking in the hallway o2 sat 88-89% then with in a few seconds sat back in the mid 90's.

## 2012-12-26 LAB — BASIC METABOLIC PANEL
BUN: 20 mg/dL (ref 6–23)
CO2: 25 mEq/L (ref 19–32)
Calcium: 9.3 mg/dL (ref 8.4–10.5)
Chloride: 99 mEq/L (ref 96–112)
Creatinine, Ser: 0.69 mg/dL (ref 0.50–1.10)
GFR calc Af Amer: >90 mL/min (ref >90)
GFR calc non Af Amer: >90 mL/min (ref >90)
Glucose, Bld: 109 mg/dL — ABNORMAL HIGH (ref 70–99)
Potassium: 4.4 mEq/L (ref 3.5–5.1)
Sodium: 133 mEq/L — ABNORMAL LOW (ref 135–145)

## 2012-12-26 LAB — CBC
HCT: 38.8 % (ref 36.0–46.0)
Hemoglobin: 13 g/dL (ref 12.0–15.0)
MCH: 30.2 pg (ref 26.0–34.0)
MCHC: 33.5 g/dL (ref 30.0–36.0)
MCV: 90.2 fL (ref 78.0–100.0)
Platelets: 273 10*3/uL (ref 150–400)
RBC: 4.3 MIL/uL (ref 3.87–5.11)
RDW: 13.6 % (ref 11.5–15.5)
WBC: 27 10*3/uL — ABNORMAL HIGH (ref 4.0–10.5)

## 2012-12-26 NOTE — Progress Notes (Signed)
PULMONARY  / CRITICAL CARE MEDICINE  Name: [name] MRN: [id number] DOB: 15-May-1961    LOS: 3  REFERRING MD :  Knapp/ ER  CHIEF COMPLAINT:  sob  BRIEF PATIENT DESCRIPTION:  52 y/o female  smoker admitted 1/7 with increased dyspnea, rhinorrhea, headache, chills, myalgias, cough with purulent sputum, nausea, vomiting from AECOPD.  Initially needed BiPAP in ER, and PCCM asked to admit .  Previously followed by Dr. Delton Coombes as outpt.  Difficulty with medication refills/follow up due to lack of insurance.  Significant PMHx: COPD/asthma, Depression, HTN, GERD, Headaches, PVD, Arthritis   ANTIBIOTICS: Levaquin 1/7 >>1/12 (stop date in place)  CULTURES: Influenza PCR 1/07 >> negative  EVENTS:   TESTS: 06/15/09 PFT >> FEV1 1.11 (44%), FEV1% 48, TLC 4.46 (93%), DLCO 48%, +BD 10/22/11 Echo >> EF 55 to 60%  SUBJECTIVE:  Continues to have shortness of breath with walking bed to chair, easily fatigued.    VITAL SIGNS: Temp:  [98.6 F (37 C)-98.8 F (37.1 C)] 98.6 F (37 C) (01/10 0541) Pulse Rate:  [61-70] 61  (01/10 0541) Resp:  [18-20] 18  (01/10 0541) BP: (122-125)/(78-79) 122/78 mmHg (01/10 0541) SpO2:  [92 %-96 %] 92 % (01/10 0812) FIO2 RA  INTAKE / OUTPUT: Intake/Output      01/09 0701 - 01/10 0700 01/10 0701 - 01/11 0700   P.O. 480 360   I.V. (mL/kg)     Total Intake(mL/kg) 480 (6.2) 360 (4.6)   Urine (mL/kg/hr) 550 (0.3)    Total Output 550    Net -70 +360        Urine Occurrence 1 x 2 x     PHYSICAL EXAMINATION: General: No distress, speaks in full sentences HEENT:  No sinus tenderness Chest : b/l wheeze pan exp, better with purse lip Cardiac: s1s2 regular, no murmur Abd: soft, non tender Ext: no edema Neuro: alert/ approp with normal strength  LABS: Cbc  Lab 12/26/12 0515 12/25/12 0459 12/23/12 1300  WBC 27.0* -- --  HGB 13.0 12.8 12.6  HCT 38.8 38.4 36.8  PLT 273 298 255   Chemistry  Lab 12/26/12 0515 12/25/12 0459 12/24/12 0900  NA 133*  135 135  K 4.4 4.8 4.5  CL 99 102 99  CO2 25 25 26   BUN 20 18 10   CREATININE 0.69 0.71 0.65  CALCIUM 9.3 9.7 9.6  MG -- 2.4 --  PHOS -- 2.3 --  GLUCOSE 109* 144* 169*   Liver fxn  Lab 12/23/12 1300  AST 16  ALT 15  ALKPHOS 89  BILITOT 0.3  PROT 7.3  ALBUMIN 4.0   BNP  Lab 12/23/12 1502  PROBNP 41.3   IMAGING: No results found.  ASSESSMENT / PLAN:  Acute COPD exacerbation. Leukocytosis - likely related to steroid use  Plan:  Bronchial hygiene Levaquin per dashboard = 5 days total for AECOPD s PNA Solumedrol to 40 mg q12h, consider reduction 1/11 if wheezing reduced Continue scheduled BD's Ambulate as tolerated   Hx of HTN, PVD.  Plan: Continue bisoprolol >> the most cardioselective, generic beta blocker available Continue ASA  Hypokalemia.  Plan: F/u and replace electrolytes as needed  Nausea, vomiting, diarrhea. Amylase, lipase normal from 1/07.  Improved 1/08.  Plan: Advance diet as tolerated PRN zofran  Depression.  Plan: Continue celexa  Global: Lack of insurance coverage has been significant barrier to adequate outpt healthcare.  Social work assisting with this.   Sandrea Hughs, MD Pulmonary and Critical Care Medicine [facility] Cell [phone]  After 5:30 PM or weekends, call [phone]

## 2012-12-26 NOTE — Progress Notes (Signed)
Met with pt at bedside to discuss financial concerns regarding affording her prescriptions. Pt does qualify for assistance through the [facility] program.  I encouraged her to reapply for Medicaid using the same process she used before. I also informed her that she can go to [facility] for PCP which is the new new location for pts previously seen at [facility].

## 2012-12-27 DIAGNOSIS — R0602 Shortness of breath: Secondary | ICD-10-CM

## 2012-12-27 DIAGNOSIS — D72829 Elevated white blood cell count, unspecified: Secondary | ICD-10-CM

## 2012-12-27 MED ORDER — MOMETASONE FURO-FORMOTEROL FUM 200-5 MCG/ACT IN AERO
2.0000 | INHALATION_SPRAY | Freq: Two times a day (BID) | RESPIRATORY_TRACT | Status: DC
  Administered 2012-12-27 – 2012-12-29 (×4): 2 via RESPIRATORY_TRACT
  Filled 2012-12-27: qty 8.8

## 2012-12-27 MED ORDER — MELOXICAM 7.5 MG PO TABS
7.5000 mg | ORAL_TABLET | Freq: Every day | ORAL | Status: DC
  Administered 2012-12-27 – 2012-12-29 (×3): 7.5 mg via ORAL
  Filled 2012-12-27 (×3): qty 1

## 2012-12-27 MED ORDER — GUAIFENESIN ER 600 MG PO TB12
1200.0000 mg | ORAL_TABLET | Freq: Two times a day (BID) | ORAL | Status: DC
  Administered 2012-12-27 – 2012-12-29 (×5): 1200 mg via ORAL
  Filled 2012-12-27 (×6): qty 2

## 2012-12-27 MED ORDER — TRAMADOL HCL 50 MG PO TABS
50.0000 mg | ORAL_TABLET | Freq: Four times a day (QID) | ORAL | Status: DC | PRN
  Administered 2012-12-27 – 2012-12-28 (×2): 50 mg via ORAL
  Filled 2012-12-27 (×2): qty 1

## 2012-12-27 NOTE — Progress Notes (Signed)
PULMONARY  / CRITICAL CARE MEDICINE  Name: [name] MRN: [id number] DOB: 07/08/1961    LOS: 4  REFERRING MD :  Knapp/ ER  CHIEF COMPLAINT:  sob  BRIEF PATIENT DESCRIPTION:  52 y/o female  smoker admitted 1/7 with increased dyspnea, rhinorrhea, headache, chills, myalgias, cough with purulent sputum, nausea, vomiting from AECOPD.  Initially needed BiPAP in ER, and PCCM asked to admit .  Previously followed by Dr. Delton Coombes as outpt.  Difficulty with medication refills/follow up due to lack of insurance.  Significant PMHx: COPD/asthma, Depression, HTN, GERD, Headaches, PVD, Arthritis   ANTIBIOTICS: Levaquin 1/7 >>1/12 (stop date in place)  CULTURES: Influenza PCR 1/07 >> negative  EVENTS:   TESTS: 06/15/09 PFT >> FEV1 1.11 (44%), FEV1% 48, TLC 4.46 (93%), DLCO 48%, +BD 10/22/11 Echo >> EF 55 to 60%  SUBJECTIVE:   1/10> Continues to have shortness of breath with walking bed to chair, easily fatigued.  1/11> persisrtent cough, congestion, wheezing. Doesn't think she can make it at home.   VITAL SIGNS: Temp:  [98.1 F (36.7 C)-98.7 F (37.1 C)] 98.7 F (37.1 C) (01/11 0600) Pulse Rate:  [62-69] 69  (01/11 0600) Resp:  [16-18] 18  (01/11 0600) BP: (123-135)/(71-86) 132/71 mmHg (01/11 0600) SpO2:  [92 %-98 %] 98 % (01/11 0806) FIO2 RA  INTAKE / OUTPUT: Intake/Output      01/10 0701 - 01/11 0700 01/11 0701 - 01/12 0700   P.O. 600    Total Intake(mL/kg) 600 (7.7)    Urine (mL/kg/hr)     Total Output     Net +600         Urine Occurrence 2 x      PHYSICAL EXAMINATION: General: No distress, speaks in full sentences HEENT:  No sinus tenderness Chest : b/l wheeze pan exp, better with purse lip Cardiac: s1s2 regular, no murmur Abd: soft, non tender Ext: no edema Neuro: alert/ approp with normal strength  LABS: Cbc  Lab 12/26/12 0515 12/25/12 0459 12/23/12 1300  WBC 27.0* -- --  HGB 13.0 12.8 12.6  HCT 38.8 38.4 36.8  PLT 273 298 255   Chemistry  Lab  12/26/12 0515 12/25/12 0459 12/24/12 0900  NA 133* 135 135  K 4.4 4.8 4.5  CL 99 102 99  CO2 25 25 26   BUN 20 18 10   CREATININE 0.69 0.71 0.65  CALCIUM 9.3 9.7 9.6  MG -- 2.4 --  PHOS -- 2.3 --  GLUCOSE 109* 144* 169*   Liver fxn  Lab 12/23/12 1300  AST 16  ALT 15  ALKPHOS 89  BILITOT 0.3  PROT 7.3  ALBUMIN 4.0   BNP  Lab 12/23/12 1502  PROBNP 41.3   IMAGING: CXR 1/7> IMPRESSION: Very mild vascular congestion. This represents a change when compared to the prior study. The study is otherwise negative.    ASSESSMENT / PLAN:  Acute COPD exacerbation. Leukocytosis - likely related to steroid use Plan: Bronchial hygiene Levaquin per dashboard = 5 days total for AECOPD s PNA Solumedrol to 40 mg q12h=> continue for now Continue scheduled BD's via NEB Q6H Add Dulera200- 2spBid to transition to a home regimen Add MUCINEX- 2Bid for the mucous plugging Add Flutter Rx... Ambulate as tolerated   Hx of HTN, PVD. Plan: Continue bisoprolol >> the most cardioselective, generic beta blocker available Continue ASA   Hypokalemia. Plan: F/u and replace electrolytes as needed 1/11> K=4.4, BUN=20 Creat=0.69   Nausea, vomiting, diarrhea. Amylase, lipase normal from 1/07.   N&V  Improved 1/08. Plan: Advance diet as tolerated PRN zofran   Depression. Plan: Continue celexa   Global: Lack of insurance coverage has been significant barrier to adequate outpt healthcare.  Social work assisting with this.   Lonzo Cloud. Kriste Basque, MD 12/27/12 @ 1:33PM

## 2012-12-28 DIAGNOSIS — R0789 Other chest pain: Secondary | ICD-10-CM

## 2012-12-28 MED ORDER — PREDNISONE 20 MG PO TABS
20.0000 mg | ORAL_TABLET | Freq: Two times a day (BID) | ORAL | Status: DC
  Administered 2012-12-28 – 2012-12-29 (×2): 20 mg via ORAL
  Filled 2012-12-28 (×4): qty 1

## 2012-12-28 NOTE — Progress Notes (Signed)
PULMONARY  / CRITICAL CARE MEDICINE  Name: ZAYLYNN RICKETT MRN: [id number] DOB: 07/04/1961    LOS: 5  REFERRING MD :  Knapp/ ER  CHIEF COMPLAINT:  sob  BRIEF PATIENT DESCRIPTION:  52 y/o female  smoker admitted 1/7 with increased dyspnea, rhinorrhea, headache, chills, myalgias, cough with purulent sputum, nausea, vomiting from AECOPD.  Initially needed BiPAP in ER, and PCCM asked to admit .  Previously followed by Dr. Delton Coombes as outpt.  Difficulty with medication refills/follow up due to lack of insurance.  Significant PMHx: COPD/asthma, Depression, HTN, GERD, Headaches, PVD, Arthritis   ANTIBIOTICS: Levaquin 1/7 >>1/12 (stop date in place)  CULTURES: Influenza PCR 1/07 >> negative  EVENTS:   TESTS: 06/15/09 PFT >> FEV1 1.11 (44%), FEV1% 48, TLC 4.46 (93%), DLCO 48%, +BD 10/22/11 Echo >> EF 55 to 60%  SUBJECTIVE:   1/10> Continues to have shortness of breath with walking bed to chair, easily fatigued.  1/11> persisrtent cough, congestion, wheezing. Doesn't think she can make it at home. 1/12> sl better, less wheezing & congestion (ch to PO Pred).   VITAL SIGNS: Temp:  [98.3 F (36.8 C)-98.5 F (36.9 C)] 98.5 F (36.9 C) (01/12 0450) Pulse Rate:  [63-84] 84  (01/12 0450) Resp:  [18] 18  (01/12 0450) BP: (114-126)/(68-91) 114/68 mmHg (01/12 0450) SpO2:  [92 %-95 %] 94 % (01/12 0801) FIO2 RA  INTAKE / OUTPUT: Intake/Output      01/11 0701 - 01/12 0700 01/12 0701 - 01/13 0700   P.O.     Total Intake(mL/kg)     Net          Urine Occurrence 4 x      PHYSICAL EXAMINATION: General: No distress, speaks in full sentences HEENT:  No sinus tenderness Chest : decr wheezing, less congested, scat rhonchi Cardiac: s1s2 regular, no murmur Abd: soft, non tender Ext: no edema Neuro: alert/ approp with normal strength  LABS: Cbc  Lab 12/26/12 0515 12/25/12 0459 12/23/12 1300  WBC 27.0* -- --  HGB 13.0 12.8 12.6  HCT 38.8 38.4 36.8  PLT 273 298 255    Chemistry  Lab 12/26/12 0515 12/25/12 0459 12/24/12 0900  NA 133* 135 135  K 4.4 4.8 4.5  CL 99 102 99  CO2 25 25 26   BUN 20 18 10   CREATININE 0.69 0.71 0.65  CALCIUM 9.3 9.7 9.6  MG -- 2.4 --  PHOS -- 2.3 --  GLUCOSE 109* 144* 169*   Liver fxn  Lab 12/23/12 1300  AST 16  ALT 15  ALKPHOS 89  BILITOT 0.3  PROT 7.3  ALBUMIN 4.0   BNP  Lab 12/23/12 1502  PROBNP 41.3   IMAGING: CXR 1/7> IMPRESSION: Very mild vascular congestion. This represents a change when compared to the prior study. The study is otherwise negative.    ASSESSMENT / PLAN:  Acute COPD exacerbation. Leukocytosis - likely related to steroid use Plan: Bronchial hygiene Levaquin per dashboard = 5 days total for AECOPD s PNA Solumedrol to 40 mg q12h=> ready to ch to PO Pred. Continue scheduled BD's via NEB Q6H Dulera200- 2spBid added to transition to a home regimen MUCINEX- 2Bid added for the mucous plugging Flutter Rx & Ambulation w/ walker helping...    Hx of HTN, PVD. Plan: Continue bisoprolol >> the most cardioselective, generic beta blocker available Continue ASA   Hypokalemia. Plan: F/u and replace electrolytes as needed 1/11> K=4.4, BUN=20 Creat=0.69   Nausea, vomiting, diarrhea. Amylase, lipase normal from 1/07.  N&V Improved 1/08. Plan: Advance diet as tolerated PRN zofran   Depression. Plan: Continue celexa   Global: Lack of insurance coverage has been significant barrier to adequate outpt healthcare.  Social work assisting with this.   Lonzo Cloud. Kriste Basque, MD 12/28/12 @ 12:55PM

## 2012-12-29 DIAGNOSIS — J309 Allergic rhinitis, unspecified: Secondary | ICD-10-CM

## 2012-12-29 DIAGNOSIS — J45909 Unspecified asthma, uncomplicated: Secondary | ICD-10-CM

## 2012-12-29 DIAGNOSIS — R061 Stridor: Secondary | ICD-10-CM

## 2012-12-29 MED ORDER — TRAMADOL HCL 50 MG PO TABS: 50.0000 mg | ORAL_TABLET | Freq: Four times a day (QID) | ORAL | Status: DC | PRN

## 2012-12-29 MED ORDER — HYDROCHLOROTHIAZIDE 25 MG PO TABS: 25.0000 mg | ORAL_TABLET | Freq: Every day | ORAL | Status: DC

## 2012-12-29 MED ORDER — PREDNISONE 10 MG PO TABS: ORAL_TABLET | ORAL | Status: DC

## 2012-12-29 MED ORDER — BISOPROLOL FUMARATE 5 MG PO TABS: 2.5000 mg | ORAL_TABLET | Freq: Every day | ORAL | Status: DC

## 2012-12-29 MED ORDER — CITALOPRAM HYDROBROMIDE 20 MG PO TABS: 10.0000 mg | ORAL_TABLET | Freq: Every day | ORAL | Status: DC

## 2012-12-29 MED ORDER — MOMETASONE FURO-FORMOTEROL FUM 200-5 MCG/ACT IN AERO: 2.0000 | INHALATION_SPRAY | Freq: Two times a day (BID) | RESPIRATORY_TRACT | Status: DC

## 2012-12-29 NOTE — Progress Notes (Signed)
Voices understanding of d/c instructions.   No changes noted since am assessment.  Scripts given to pt.

## 2012-12-29 NOTE — Discharge Summary (Signed)
Physician Discharge Summary  Patient ID: [name] MRN: [id number] DOB/AGE: August 29, 1961 52 y.o.  Admit date: 12/23/2012 Discharge date: 12/29/2012  Problem List Active Problems:  HYPERTENSION  COPD  Hypokalemia  COPD exacerbation  HPI: 52 y/o female smoker admitted 1/7 with increased dyspnea, rhinorrhea, headache, chills, myalgias, cough with purulent sputum, nausea, vomiting from AECOPD. Initially needed BiPAP in ER, and PCCM asked to admit . Previously followed by Dr. Delton Coombes as outpt. Difficulty with medication refills/follow up due to lack of insurance  Hospital Course: ANTIBIOTICS:  Levaquin 1/7 >>1/12 (stop date in place)  CULTURES:  Influenza PCR 1/07 >> negative  EVENTS:  TESTS:  06/15/09 PFT >> FEV1 1.11 (44%), FEV1% 48, TLC 4.46 (93%), DLCO 48%, +BD  10/22/11 Echo >> EF 55 to 60%  ASSESSMENT / PLAN:  Acute COPD exacerbation.  Leukocytosis - likely related to steroid use  Plan:  Bronchial hygiene  Levaquin per dashboard = 5 days total for AECOPD s PNA  Solumedrol to 40 mg q12h=> ready to ch to PO Pred.  Continue scheduled BD's via NEB Q6H  Dulera200- 2spBid added to transition to a home regimen  MUCINEX- 2Bid added for the mucous plugging  Flutter Rx & Ambulation w/ walker helping...  Hx of HTN, PVD.  Plan:  See dc medication sheet. Continue ASA  Hypokalemia.  Plan:  F/u and replace electrolytes as needed  1/11> K=4.4, BUN=20 Creat=0.69  Nausea, vomiting, diarrhea.  Amylase, lipase normal from 1/07.  N&V Improved 1/08.  Plan:  Advance diet as tolerated  PRN zofran  Depression.  Plan:  Continue celexa  Global:  Lack of insurance coverage has been significant barrier to adequate outpt healthcare. Social work assisting with this.       Labs at discharge Lab Results  Component Value Date   CREATININE 0.69 12/26/2012   BUN 20 12/26/2012   NA 133* 12/26/2012   K 4.4 12/26/2012   CL 99 12/26/2012   CO2 25 12/26/2012   Lab Results  Component  Value Date   WBC 27.0* 12/26/2012   HGB 13.0 12/26/2012   HCT 38.8 12/26/2012   MCV 90.2 12/26/2012   PLT 273 12/26/2012   Lab Results  Component Value Date   ALT 15 12/23/2012   AST 16 12/23/2012   ALKPHOS 89 12/23/2012   BILITOT 0.3 12/23/2012   No results found for this basename: INR, PROTIME    Current radiology studies No results found.  Disposition:  01-Home or Self Care  Discharge Orders    Future Appointments: Provider: Department: Dept Phone: Center:   01/09/2013 4:00 PM Leslye Peer, MD [facility] [phone] None     Future Orders Please Complete By Expires   Discharge patient          Medication List     As of 12/29/2012 12:17 PM    STOP taking these medications         ADVAIR DISKUS 500-50 MCG/DOSE Aepb   Generic drug: Fluticasone-Salmeterol      albuterol-ipratropium 18-103 MCG/ACT inhaler   Commonly known as: COMBIVENT      TAKE these medications         albuterol (5 MG/ML) 0.5% nebulizer solution   Commonly known as: PROVENTIL   Take 2.5 mg by nebulization at bedtime as needed. As needed for shortness of breath      albuterol 108 (90 BASE) MCG/ACT inhaler   Commonly known as: PROVENTIL HFA;VENTOLIN HFA   Inhale 1-2 puffs into the lungs every  6 (six) hours as needed for wheezing.      aspirin 325 MG EC tablet   Take 325 mg by mouth daily.      atenolol 50 MG tablet   Commonly known as: TENORMIN   Take 50 mg by mouth daily.      BENADRYL 25 MG tablet   Generic drug: diphenhydrAMINE   Take 25 mg by mouth every 6 (six) hours as needed. As needed for sleep and/or itching      citalopram 10 MG tablet   Commonly known as: CELEXA   Take 20 mg by mouth daily.      ergocalciferol 50000 UNITS capsule   Commonly known as: VITAMIN D2   Take 50,000 Units by mouth once a week.      hydrochlorothiazide 25 MG tablet   Commonly known as: HYDRODIURIL   Take 25 mg by mouth daily.      meloxicam 7.5 MG tablet   Commonly known as: MOBIC   Take  7.5 mg by mouth daily.      methocarbamol 500 MG tablet   Commonly known as: ROBAXIN   Take 500 mg by mouth 4 (four) times daily.      mometasone-formoterol 200-5 MCG/ACT Aero   Commonly known as: DULERA   Inhale 2 puffs into the lungs 2 (two) times daily.      oxycodone 5 MG capsule   Commonly known as: OXY-IR   Take 5 mg by mouth every 4 (four) hours as needed. pain      predniSONE 10 MG tablet   Commonly known as: DELTASONE   Take 4 tabs  daily with food x 4 days, then 3 tabs daily x 4 days, then 2 tabs daily x 4 days, then 1 tab daily x4 days then stop. #40      traMADol 50 MG tablet   Commonly known as: ULTRAM   Take 50 mg by mouth every 6 (six) hours as needed. For pain.           Follow-up Information    Follow up with PARRETT,TAMMY, NP. On 01/01/2013. (Appt at 4:00 PM)    Contact information:   [facility] [address] [place] [address] [phone]       Follow up with Leslye Peer., MD. On 01/09/2013. (4:00 pm )    Contact information:   [address] [facility] [place] [address] [phone]          Discharged Condition: {good Vital signs at Discharge. Temp:  [97.6 F (36.4 C)-98.4 F (36.9 C)] 98.4 F (36.9 C) (01/13 0653) Pulse Rate:  [65-71] 71  (01/13 0653) Resp:  [18] 18  (01/13 0653) BP: (114-140)/(71-88) 114/73 mmHg (01/13 0653) SpO2:  [90 %-96 %] 93 % (01/13 0807) Office follow up Special Information or instructions. See Tammy Parrett ANP-BC for medication consolidation. Signed: Brett Canales Minor ACNP [facility] Pager [phone] till 3 pm If no answer page [phone] 12/29/2012, 12:17 PM  Levy Pupa, MD [facility] 12/29/12

## 2013-01-01 ENCOUNTER — Inpatient Hospital Stay: Payer: Medicaid Other | Admitting: Adult Health

## 2013-01-09 ENCOUNTER — Ambulatory Visit (INDEPENDENT_AMBULATORY_CARE_PROVIDER_SITE_OTHER): Payer: Medicaid Other | Admitting: Emergency Medicine

## 2013-01-09 ENCOUNTER — Encounter: Payer: Self-pay | Admitting: Emergency Medicine

## 2013-01-09 VITALS — BP 102/60 | HR 66 | Temp 98.5°F | Ht 63.0 in | Wt 171.6 lb

## 2013-01-09 DIAGNOSIS — J45909 Unspecified asthma, uncomplicated: Secondary | ICD-10-CM

## 2013-01-09 NOTE — Patient Instructions (Addendum)
Please continue your Dulera 2 puffs twice a day. Rinse out your mouth after using. Use albuterol 2 puffs if needed for shortness of breath Congratulations on stopping smoking! Do not restart. Call our office if you are struggling with this.  Start nasonex 2 sprays each nostril daily. If this medicaine helps your drainage and throat irritation, then we can call in a prescription for a similar spray to your pharmacy Follow with Dr Delton Coombes in about 1 month with full PFT on the same day

## 2013-01-09 NOTE — Assessment & Plan Note (Signed)
Please continue your Dulera 2 puffs twice a day. Rinse out your mouth after using. Use albuterol 2 puffs if needed for shortness of breath Congratulations on stopping smoking! Do not restart. Call our office if you are struggling with this.  Start nasonex 2 sprays each nostril daily. If this medicaine helps your drainage and throat irritation, then we can call in a prescription for a similar spray to your pharmacy Follow with Dr Byrum in about 1 month with full PFT on the same day 

## 2013-01-09 NOTE — Progress Notes (Signed)
52 yo former smoker with hx HTN, migraine HA, asthma dx in 2000 after a hospitalization for dyspnea. No asthma as a child. Had been managed on Advair 250 (ran out), now on Dulera + SABA since hosp. She was hospitalized 1/7 - 1/13 for an acute exacerbation.  She has been doing fairly well, is having some stabbing pains with exertion (prior cards eval was reassuring). She had gone back to smoking since last Summer, just quit again when hospitalized. She is weaning pred now. Uses albuterol every day at least once. She is planning to restart exercise routine. She is taking benadryl for itching. She is having a lot of nasal gtt and UA irritation.    PULMONARY FUNCTON TEST 06/15/2009  FVC 2.01  FEV1 .87  FEV1/FVC 43.3  FVC  % Predicted 61  FEV % Predicted 34  FeF 25-75 .32  FeF 25-75 % Predicted 2.95   Filed Vitals:   01/09/13 1606  BP: 102/60  Pulse: 66  Temp: 98.5 F (36.9 C)   Gen: Pleasant, well-nourished, in no distress,  normal affect  ENT: No lesions,  mouth clear,  oropharynx clear, no postnasal drip  Neck: No JVD, no TMG, no carotid bruits  Lungs: No use of accessory muscles, no dullness to percussion, clear without rales or rhonchi  Cardiovascular: RRR, heart sounds normal, no murmur or gallops, no peripheral edema  Abdomen: soft and NT, no HSM,  BS normal  Musculoskeletal: No deformities, no cyanosis or clubbing  Neuro: alert, non focal  Skin: Warm, no lesions or rashes  ASTHMA Please continue your Dulera 2 puffs twice a day. Rinse out your mouth after using. Use albuterol 2 puffs if needed for shortness of breath Congratulations on stopping smoking! Do not restart. Call our office if you are struggling with this.  Start nasonex 2 sprays each nostril daily. If this medicaine helps your drainage and throat irritation, then we can call in a prescription for a similar spray to your pharmacy Follow with Dr Delton Coombes in about 1 month with full PFT on the same day

## 2013-01-31 ENCOUNTER — Other Ambulatory Visit: Payer: Self-pay

## 2013-02-19 ENCOUNTER — Ambulatory Visit: Payer: Self-pay | Admitting: Emergency Medicine

## 2013-02-25 ENCOUNTER — Telehealth: Payer: Self-pay | Admitting: Emergency Medicine

## 2013-02-25 MED ORDER — PREDNISONE 10 MG PO TABS: ORAL_TABLET | ORAL | Status: DC

## 2013-02-25 NOTE — Telephone Encounter (Signed)
Pt states that she is having a runny nose, chest tightness, cough with production of white mucus and SOB with exertion. Denies fever or chills. Has been using nebulizer without relief.  Please advise on RB's behalf. Thank you!!

## 2013-02-25 NOTE — Telephone Encounter (Signed)
Stop atenolol if already on bisoprolol  Prednisone 10 mg take  4 each am x 2 days,   2 each am x 2 days,  1 each am x2days and stop  W/in by end of day 3/14 if not better  To er if worse

## 2013-02-25 NOTE — Telephone Encounter (Signed)
LMOMTCB X1 FOR PT 

## 2013-02-25 NOTE — Telephone Encounter (Signed)
Pt is aware of MW recommendations. Rx has been sent in.

## 2013-05-22 DIAGNOSIS — F319 Bipolar disorder, unspecified: Secondary | ICD-10-CM | POA: Diagnosis not present

## 2013-05-28 ENCOUNTER — Ambulatory Visit (INDEPENDENT_AMBULATORY_CARE_PROVIDER_SITE_OTHER): Payer: Medicare Other | Admitting: Emergency Medicine

## 2013-05-28 ENCOUNTER — Telehealth: Payer: Self-pay | Admitting: Emergency Medicine

## 2013-05-28 ENCOUNTER — Encounter: Payer: Self-pay | Admitting: Emergency Medicine

## 2013-05-28 VITALS — BP 142/98 | HR 93 | Temp 98.2°F | Ht 63.0 in | Wt 178.0 lb

## 2013-05-28 DIAGNOSIS — J45909 Unspecified asthma, uncomplicated: Secondary | ICD-10-CM | POA: Diagnosis not present

## 2013-05-28 NOTE — Telephone Encounter (Signed)
I spoke with pt and she c/o cough, wheezing, SOB. appt scheduled to see RB. Nothing further was needed

## 2013-05-28 NOTE — Assessment & Plan Note (Signed)
Poor control, currently on no meds either BD or nasal steroid - restart dulera, nasonex, proventil - completed financial assistance paperwork for [employer] - rov 2 months

## 2013-05-28 NOTE — Progress Notes (Signed)
52 yo former smoker with hx HTN, migraine HA, asthma dx in 2000 after a hospitalization for dyspnea. No asthma as a child. Had been managed on Advair 250 (ran out), now on Dulera + SABA since hosp. She was hospitalized 1/7 - 1/13 for an acute exacerbation.  She has been doing fairly well, is having some stabbing pains with exertion (prior cards eval was reassuring). She had gone back to smoking since last Summer, just quit again when hospitalized. She is weaning pred now. Uses albuterol every day at least once. She is planning to restart exercise routine. She is taking benadryl for itching. She is having a lot of nasal gtt and UA irritation.   PULMONARY FUNCTON TEST 06/15/2009  FVC 2.01  FEV1 .87  FEV1/FVC 43.3  FVC  % Predicted 61  FEV % Predicted 34  FeF 25-75 .32  FeF 25-75 % Predicted 2.95   Acute OV 05/28/13 -- she has ben having more problems with congestion, more problems with wheeze, cough, dyspnea. She was on Dulera, nasonex - hasn't been able to take in months because she lost her job.    Filed Vitals:   05/28/13 1457  BP: 142/98  Pulse: 93  Temp: 98.2 F (36.8 C)   Gen: Pleasant, well-nourished, in no distress,  normal affect  ENT: No lesions,  mouth clear,  oropharynx clear, very congested, hoarse voice  Neck: No JVD, no TMG, no carotid bruits  Lungs: No use of accessory muscles,  clear without rales or rhonchi  Cardiovascular: RRR, heart sounds normal, no murmur or gallops, no peripheral edema  Musculoskeletal: No deformities, no cyanosis or clubbing  Neuro: alert, non focal  Skin: Warm, no lesions or rashes  ASTHMA Poor control, currently on no meds either BD or nasal steroid - restart dulera, nasonex, proventil - completed financial assistance paperwork for [employer] - rov 2 months

## 2013-05-28 NOTE — Patient Instructions (Addendum)
Please restart Dulera 2 puffs twice a day Use albuterol 2 puffs up to every 4 hours if needed for shortness of breath Start nasonex 2 sprays each nostril daily We will work on [employer] investment banker for your medications from [facility].  Follow with Dr Delton Coombes in 2 months or sooner if you have any problems.

## 2013-06-16 ENCOUNTER — Ambulatory Visit (INDEPENDENT_AMBULATORY_CARE_PROVIDER_SITE_OTHER): Payer: Medicare Other | Admitting: Internal Medicine

## 2013-06-16 ENCOUNTER — Encounter: Payer: Self-pay | Admitting: Internal Medicine

## 2013-06-16 ENCOUNTER — Ambulatory Visit (INDEPENDENT_AMBULATORY_CARE_PROVIDER_SITE_OTHER)
Admission: RE | Admit: 2013-06-16 | Discharge: 2013-06-16 | Disposition: A | Discharge status: Home / Self Care | Payer: Medicare Other | Source: Ambulatory Visit | Attending: Internal Medicine | Admitting: Internal Medicine

## 2013-06-16 ENCOUNTER — Telehealth: Payer: Self-pay | Admitting: Emergency Medicine

## 2013-06-16 VITALS — BP 144/96 | HR 82 | Temp 97.5°F | Ht 62.75 in | Wt 177.6 lb

## 2013-06-16 DIAGNOSIS — J449 Chronic obstructive pulmonary disease, unspecified: Secondary | ICD-10-CM | POA: Diagnosis not present

## 2013-06-16 DIAGNOSIS — I10 Essential (primary) hypertension: Secondary | ICD-10-CM

## 2013-06-16 DIAGNOSIS — R0789 Other chest pain: Secondary | ICD-10-CM | POA: Diagnosis not present

## 2013-06-16 DIAGNOSIS — J4489 Other specified chronic obstructive pulmonary disease: Secondary | ICD-10-CM

## 2013-06-16 MED ORDER — AMOXICILLIN-POT CLAVULANATE 875-125 MG PO TABS: 1.0000 | ORAL_TABLET | Freq: Two times a day (BID) | ORAL | Status: DC

## 2013-06-16 MED ORDER — PREDNISONE (PAK) 10 MG PO TABS: ORAL_TABLET | ORAL | Status: DC

## 2013-06-16 NOTE — Progress Notes (Signed)
52 yo former smoker with hx HTN, migraine HA, asthma dx in 2000 after a hospitalization for dyspnea. No asthma as a child. Had been managed on Advair 250 (ran out), now on Dulera + SABA since hosp. She was hospitalized 1/7 - 1/13 for an acute exacerbation.  She has been doing fairly well, is having some stabbing pains with exertion (prior cards eval was reassuring). She had gone back to smoking since last Summer, just quit again when hospitalized. She is weaning pred now. Uses albuterol every day at least once. She is planning to restart exercise routine. She is taking benadryl for itching. She is having a lot of nasal gtt and UA irritation.   PULMONARY FUNCTON TEST 06/15/2009  FVC 2.01  FEV1 .87  FEV1/FVC 43.3  FVC  % Predicted 61  FEV % Predicted 34  FeF 25-75 .32  FeF 25-75 % Predicted 2.95   Acute OV 05/28/13 -- she has ben having more problems with congestion, more problems with wheeze, cough, dyspnea. She was on Dulera, nasonex - hasn't been able to take in months because she lost her job.  rec Please restart Dulera 2 puffs twice a day Use albuterol 2 puffs up to every 4 hours if needed for shortness of breath Start nasonex 2 sprays each nostril daily We will work on Corporate investment banker for your medications from [facility].   06/16/2013 f/u ov/Kaye Mitro re copd / not able to afford meds and "they don't work anyway" Chief Complaint  Patient presents with  . Acute Visit    Pt c/o prod cough with minimal green sputum, wheezing and SOB no better since last visit here 05/28/13. She also has pain in her right side of her back and chest tightness when she lies down.    green mucus worse in am Not clear she's taking any of her meds as intended, not convinced they are helping   No obvious daytime variabilty or assoc  overt sinus or hb symptoms. No unusual exp hx or h/o childhood pna/ asthma or knowledge of premature birth.     Also denies any obvious fluctuation of symptoms with weather or environmental  changes or other aggravating or alleviating factors except as outlined above     Gen: Pleasant, well-nourished, in no distress,  normal affect  ENT: No lesions,  mouth clear,  oropharynx clear, very congested, hoarse voice  Neck: No JVD, no TMG, no carotid bruits  Lungs: No use of accessory muscles,  Poor air movement/ insp and exp rhonchi  Cardiovascular: RRR, heart sounds normal, no murmur or gallops, no peripheral edema  Musculoskeletal: No deformities, no cyanosis or clubbing  Neuro: alert, non focal  Skin: Warm, no lesions or rashes      CXR  06/16/2013 :   No active disease. No significant change.

## 2013-06-16 NOTE — Telephone Encounter (Signed)
Pt c/o prod cough (green), Chest congestion, wheezing, pain in left side of back and around to breast area for 3 days.  Worse with deep breath.  Pt given appt with Dr Sherene Sires at 9:45.

## 2013-06-16 NOTE — Patient Instructions (Addendum)
Prednisone 10 mg take  4 each am x 2 days,   2 each am x 2 days,  1 each am x 2 days and stop   Augmentin 875 mg twice daily x 10 days   Please remember to go to the  x-ray department downstairs for your tests - we will call you with the results when they are available.    Be sure you see Dr Delton Coombes before your medication runs out.  If your condition worsens you will need to return to the ER

## 2013-06-20 NOTE — Assessment & Plan Note (Signed)
DDX of  difficult airways managment all start with A and  include Adherence, Ace Inhibitors, Acid Reflux, Active Sinus Disease, Alpha 1 Antitripsin deficiency, Anxiety masquerading as Airways dz,  ABPA,  allergy(esp in young), Aspiration (esp in elderly), Adverse effects of DPI,  Active smokers, plus two Bs  = Bronchiectasis and Beta blocker use..and one C= CHF  Adherence is always the initial "prime suspect" and is a multilayered concern that requires a "trust but verify" approach in every patient - starting with knowing how to use medications, especially inhalers, correctly, keeping up with refills and understanding the fundamental difference between maintenance and prns vs those medications only taken for a very short course and then stopped and not refilled. The proper method of use, as well as anticipated side effects, of a metered-dose inhaler are discussed and demonstrated to the patient. Improved effectiveness after extensive coaching during this visit to a level of approximately  75%   ? Active sinus dz > rx augmentin and prednisone and f/u with Dr Delton Coombes

## 2013-06-20 NOTE — Assessment & Plan Note (Signed)
No evidence pna or ihd, likely mscp from coughing > rx tramadol.

## 2013-06-20 NOTE — Assessment & Plan Note (Signed)
Strongly prefer in this setting: Bystolic, the most beta -1  selective Beta blocker available in sample form, with bisoprolol the most selective generic choice  on the market.   Seems confused with meds, will need to use a trust but verify approach here.

## 2013-06-22 DIAGNOSIS — F319 Bipolar disorder, unspecified: Secondary | ICD-10-CM | POA: Diagnosis not present

## 2013-06-26 ENCOUNTER — Encounter (HOSPITAL_COMMUNITY): Payer: Self-pay | Admitting: Emergency Medicine

## 2013-06-26 ENCOUNTER — Emergency Department (HOSPITAL_COMMUNITY)
Admission: EM | Admit: 2013-06-26 | Discharge: 2013-06-26 | Disposition: A | Discharge status: Home / Self Care | Payer: Medicaid Other | Attending: Emergency Medicine | Admitting: Emergency Medicine

## 2013-06-26 DIAGNOSIS — Z87891 Personal history of nicotine dependence: Secondary | ICD-10-CM | POA: Insufficient documentation

## 2013-06-26 DIAGNOSIS — Z8719 Personal history of other diseases of the digestive system: Secondary | ICD-10-CM | POA: Insufficient documentation

## 2013-06-26 DIAGNOSIS — J441 Chronic obstructive pulmonary disease with (acute) exacerbation: Secondary | ICD-10-CM | POA: Insufficient documentation

## 2013-06-26 DIAGNOSIS — Z8739 Personal history of other diseases of the musculoskeletal system and connective tissue: Secondary | ICD-10-CM | POA: Insufficient documentation

## 2013-06-26 DIAGNOSIS — R52 Pain, unspecified: Secondary | ICD-10-CM | POA: Diagnosis not present

## 2013-06-26 DIAGNOSIS — F3289 Other specified depressive episodes: Secondary | ICD-10-CM | POA: Insufficient documentation

## 2013-06-26 DIAGNOSIS — IMO0002 Reserved for concepts with insufficient information to code with codable children: Secondary | ICD-10-CM | POA: Insufficient documentation

## 2013-06-26 DIAGNOSIS — R51 Headache: Secondary | ICD-10-CM | POA: Diagnosis not present

## 2013-06-26 DIAGNOSIS — J45901 Unspecified asthma with (acute) exacerbation: Secondary | ICD-10-CM | POA: Insufficient documentation

## 2013-06-26 DIAGNOSIS — F411 Generalized anxiety disorder: Secondary | ICD-10-CM | POA: Insufficient documentation

## 2013-06-26 DIAGNOSIS — Z79899 Other long term (current) drug therapy: Secondary | ICD-10-CM | POA: Insufficient documentation

## 2013-06-26 DIAGNOSIS — F329 Major depressive disorder, single episode, unspecified: Secondary | ICD-10-CM | POA: Insufficient documentation

## 2013-06-26 DIAGNOSIS — R0602 Shortness of breath: Secondary | ICD-10-CM | POA: Diagnosis not present

## 2013-06-26 DIAGNOSIS — I10 Essential (primary) hypertension: Secondary | ICD-10-CM | POA: Insufficient documentation

## 2013-06-26 DIAGNOSIS — Z7982 Long term (current) use of aspirin: Secondary | ICD-10-CM | POA: Insufficient documentation

## 2013-06-26 DIAGNOSIS — R519 Headache, unspecified: Secondary | ICD-10-CM

## 2013-06-26 DIAGNOSIS — Z8679 Personal history of other diseases of the circulatory system: Secondary | ICD-10-CM | POA: Insufficient documentation

## 2013-06-26 DIAGNOSIS — I209 Angina pectoris, unspecified: Secondary | ICD-10-CM | POA: Insufficient documentation

## 2013-06-26 DIAGNOSIS — G8929 Other chronic pain: Secondary | ICD-10-CM | POA: Insufficient documentation

## 2013-06-26 MED ORDER — TRAMADOL HCL 50 MG PO TABS: 50.0000 mg | ORAL_TABLET | Freq: Four times a day (QID) | ORAL | Status: DC | PRN

## 2013-06-26 MED ORDER — HYDROMORPHONE HCL PF 1 MG/ML IJ SOLN
1.0000 mg | Freq: Once | INTRAMUSCULAR | Status: AC
  Administered 2013-06-26: 1 mg via INTRAMUSCULAR
  Filled 2013-06-26: qty 1

## 2013-06-26 MED ORDER — BISOPROLOL FUMARATE 5 MG PO TABS: 2.5000 mg | ORAL_TABLET | Freq: Every day | ORAL | Status: DC

## 2013-06-26 MED ORDER — ATENOLOL 50 MG PO TABS: 50.0000 mg | ORAL_TABLET | Freq: Every day | ORAL | Status: DC

## 2013-06-26 NOTE — Progress Notes (Signed)
   CARE MANAGEMENT ED NOTE 06/26/2013  Patient:  [name], [name]   Account Number:  [id number]  Date Initiated:  06/26/2013  Documentation initiated by:  Radford Pax  Subjective/Objective Assessment:   Patient presents to ED with right sided facial pain.     Subjective/Objective Assessment Detail:     Action/Plan:   Action/Plan Detail:   Anticipated DC Date:       Status Recommendation to Physician:   Result of Recommendation:    Other ED Services  Consult Working Plan    DC Planning Services  Other  PCP issues    Choice offered to / List presented to:            Status of service:  Completed, signed off  ED Comments:   ED Comments Detail:  Patient reportd that she does not have a medical doctor. Patient reports taht she has a pulmonologist Dr. Ortencia Kick, but not a meidcal docotr.  [facility] provided patient a list of physicians who accept Medicaid insurance.  Patient also expressed that she needs to speak to someone about her hosptial bill from January.  Patient reports that she spoke to someone at [facility] and they told her she was overed at that time, however, "The people here say I wasn't covered by Medicaid at that time."  Instructed patient to call main number of the hospital and ask speak to someone regarding her bill.  Also instructed patient to speak to Medicaid again because if they said that she was covered at that time in January, then Medicaid should have covered the bill.  Patient verbalized understanding and was thankful for the resources.

## 2013-06-26 NOTE — ED Notes (Addendum)
Pt reports 9/10 right sided facial pain that began about two days ago. Pt denies facial numbness or tingling. Pt states she lost a dental filling about two weeks ago, however that did not result in pain at the time. Pt states she feels pain in her right eye, right ear, and right jaw. Pt reports nausea and emesis, and shortness of breath, however she reports SHOB as being baseline due to COPD.

## 2013-06-26 NOTE — ED Provider Notes (Signed)
History    CSN: [id number] Arrival date & time 06/26/13  1544  First MD Initiated Contact with Patient 06/26/13 1611     Chief Complaint  Patient presents with  . Facial Pain   (Consider location/radiation/quality/duration/timing/severity/associated sxs/prior Treatment) The history is provided by the patient.  [name] is a 52 y.o. female hx of COPD, anxiety, HTN here with R facial pain. R facial pain for the last 2 days. She said that its tender to touch. Denies numbness or weakness. She said that she has been anxious recently and has not been sleeping well. Denies fever or chills or headaches. She had a filling came out of her tooth 2 weeks ago but she said that her tooth isn't hurting. She has stable SOB from COPD. She recently saw her pulmonologist and was prescribed prednisone and augmentin but she only took the prednisone. She also have chronic pain and ran out of her tramadol and oxycodone.   Past Medical History  Diagnosis Date  . Asthma   . Anxiety   . COPD (chronic obstructive pulmonary disease)   . Angina   . Shortness of breath   . Depression   . Hypertension   . GERD (gastroesophageal reflux disease)   . Headache(784.0)   . Arthritis   . Peripheral vascular disease   . Migraine    Past Surgical History  Procedure Laterality Date  . Neck surgery    . Toe surgery    . Abdominal hysterectomy    . Tubal ligation    . Kidney stone surgery    . Hemorrhoid surgery    . Incise and drain abcess    . Back surgery    . Diagnostic laparoscopy    . Colonoscopy  12/20/2011    Procedure: COLONOSCOPY;  Surgeon: Freddy Jaksch, MD;  Location: [facility];  Service: Endoscopy;  Laterality: N/A;  . Colonoscopy  03/05/2012    Procedure: COLONOSCOPY;  Surgeon: Freddy Jaksch, MD;  Location: [facility];  Service: Endoscopy;  Laterality: N/A;   Family History  Problem Relation Age of Onset  . Diabetes Mother   . COPD Mother   . Heart disease Mother   .  Diabetes Father   . Kidney disease Father   . Cancer Father   . Anesthesia problems Father   . Diabetes Sister   . Hypertension Sister   . Diabetes Brother   . Sleep apnea Brother   . Heart disease Sister   . Diabetes Sister   . Heart disease Brother    History  Substance Use Topics  . Smoking status: Former Smoker    Quit date: 04/20/2011  . Smokeless tobacco: Never Used  . Alcohol Use: No   OB History   Grav Para Term Preterm Abortions TAB SAB Ect Mult Living   7 3 3  4 1 3   3      Review of Systems  Neurological:       R facial pain   All other systems reviewed and are negative.    Allergies  Sulfa antibiotics and Sulfonamide derivatives  Home Medications   Current Outpatient Rx  Name  Route  Sig  Dispense  Refill  . albuterol (PROVENTIL HFA;VENTOLIN HFA) 108 (90 BASE) MCG/ACT inhaler   Inhalation   Inhale 1-2 puffs into the lungs every 6 (six) hours as needed for wheezing.   1 Inhaler   0   . albuterol (PROVENTIL) (5 MG/ML) 0.5% nebulizer solution   Nebulization  Take 2.5 mg by nebulization at bedtime as needed. As needed for shortness of breath          . aspirin 325 MG EC tablet   Oral   Take 325 mg by mouth daily.         . diphenhydrAMINE (BENADRYL) 25 MG tablet   Oral   Take 25 mg by mouth every 6 (six) hours as needed. As needed for sleep and/or itching          . hydrochlorothiazide (HYDRODIURIL) 25 MG tablet   Oral   Take 1 tablet (25 mg total) by mouth daily.   30 tablet   1   . mometasone-formoterol (DULERA) 200-5 MCG/ACT AERO   Inhalation   Inhale 2 puffs into the lungs 2 (two) times daily.   1 Inhaler   3   . amoxicillin-clavulanate (AUGMENTIN) 875-125 MG per tablet   Oral   Take 1 tablet by mouth 2 (two) times daily.   20 tablet   0   . atenolol (TENORMIN) 50 MG tablet   Oral   Take 50 mg by mouth daily.         . bisoprolol (ZEBETA) 5 MG tablet   Oral   Take 0.5 tablets (2.5 mg total) by mouth daily.   30  tablet   2   . citalopram (CELEXA) 10 MG tablet   Oral   Take 20 mg by mouth daily.          . ergocalciferol (VITAMIN D2) 50000 UNITS capsule   Oral   Take 50,000 Units by mouth once a week.         Marland Kitchen oxycodone (OXY-IR) 5 MG capsule   Oral   Take 5 mg by mouth every 4 (four) hours as needed. pain         . predniSONE (STERAPRED UNI-PAK) 10 MG tablet      Prednisone 10 mg take  4 each am x 2 days,   2 each am x 2 days,  1 each am x2days and stop   14 tablet   0   . traMADol (ULTRAM) 50 MG tablet   Oral   Take 50 mg by mouth every 6 (six) hours as needed. For pain.          BP 155/87  Pulse 85  Temp(Src) 98.4 F (36.9 C) (Oral)  Resp 18  SpO2 99% Physical Exam  Nursing note and vitals reviewed. Constitutional: She is oriented to person, place, and time.  Uncomfortable   HENT:  Head: Normocephalic and atraumatic.  Right Ear: External ear normal.  Left Ear: External ear normal.  Mouth/Throat: Oropharynx is clear and moist.  R face sensitive to touch. No obvious temporal artery tenderness. No obvious swelling of the face. Poor dentition overall but no obvious peri apical swelling. Jaw not dislocated and has good bite.   Eyes: Conjunctivae are normal. Pupils are equal, round, and reactive to light.  Neck: Normal range of motion. Neck supple.  Cardiovascular: Normal rate, regular rhythm and normal heart sounds.   Pulmonary/Chest: Effort normal and breath sounds normal. No respiratory distress. She has no wheezes. She has no rales.  Abdominal: Soft. Bowel sounds are normal. She exhibits no distension. There is no tenderness. There is no rebound and no guarding.  Musculoskeletal: She exhibits no edema.  Neurological: She is alert and oriented to person, place, and time.  Skin: Skin is warm and dry.  Psychiatric: She has a normal mood and affect.  Her behavior is normal. Judgment and thought content normal.    ED Course  Procedures (including critical care  time) Labs Reviewed - No data to display No results found. No diagnosis found.   Date: 06/26/2013  Rate: 77  Rhythm: normal sinus rhythm  QRS Axis: normal  Intervals: normal  ST/T Wave abnormalities: nonspecific ST changes  Conduction Disutrbances:none  Narrative Interpretation:   Old EKG Reviewed: none available    MDM  [name] is a 52 y.o. female here with R facial pain. No evidence of ear of tooth infection. I doubt temporal arteritis or stroke. She has no insurance and would just like pain meds instead of further workup. She just filled her augmentin so if she does have a small dental infection, the augmentin will cover it.   5:21 PM Pain improved with pain meds will refill her ultram and BP meds.     Richardean Canal, MD 06/26/13 [phone]

## 2013-07-13 DIAGNOSIS — G43819 Other migraine, intractable, without status migrainosus: Secondary | ICD-10-CM | POA: Diagnosis not present

## 2013-07-13 DIAGNOSIS — F319 Bipolar disorder, unspecified: Secondary | ICD-10-CM | POA: Diagnosis not present

## 2013-07-13 DIAGNOSIS — H40039 Anatomical narrow angle, unspecified eye: Secondary | ICD-10-CM | POA: Diagnosis not present

## 2013-07-22 ENCOUNTER — Ambulatory Visit: Payer: Medicare Other | Attending: Internal Medicine | Admitting: Internal Medicine

## 2013-07-22 VITALS — BP 127/83 | HR 75 | Temp 98.4°F | Resp 16 | Ht 63.0 in | Wt 176.0 lb

## 2013-07-22 DIAGNOSIS — G8929 Other chronic pain: Secondary | ICD-10-CM | POA: Diagnosis not present

## 2013-07-22 DIAGNOSIS — J449 Chronic obstructive pulmonary disease, unspecified: Secondary | ICD-10-CM | POA: Diagnosis not present

## 2013-07-22 DIAGNOSIS — M549 Dorsalgia, unspecified: Secondary | ICD-10-CM

## 2013-07-22 DIAGNOSIS — J4489 Other specified chronic obstructive pulmonary disease: Secondary | ICD-10-CM

## 2013-07-22 DIAGNOSIS — I10 Essential (primary) hypertension: Secondary | ICD-10-CM

## 2013-07-22 LAB — C-REACTIVE PROTEIN: CRP: 0.5 mg/dL (ref <0.60)

## 2013-07-22 MED ORDER — ERGOCALCIFEROL 1.25 MG (50000 UT) PO CAPS: 50000.0000 [IU] | ORAL_CAPSULE | ORAL | Status: DC

## 2013-07-22 MED ORDER — CITALOPRAM HYDROBROMIDE 10 MG PO TABS: 20.0000 mg | ORAL_TABLET | Freq: Every day | ORAL | Status: DC

## 2013-07-22 MED ORDER — TRAMADOL HCL 50 MG PO TABS: 50.0000 mg | ORAL_TABLET | Freq: Four times a day (QID) | ORAL | Status: DC | PRN

## 2013-07-22 MED ORDER — ATENOLOL 50 MG PO TABS: 50.0000 mg | ORAL_TABLET | Freq: Every day | ORAL | Status: DC

## 2013-07-22 MED ORDER — HYDROCHLOROTHIAZIDE 25 MG PO TABS: 25.0000 mg | ORAL_TABLET | Freq: Every day | ORAL | Status: DC

## 2013-07-22 MED ORDER — MELOXICAM 15 MG PO TABS: 15.0000 mg | ORAL_TABLET | Freq: Every day | ORAL | Status: DC

## 2013-07-22 MED ORDER — BISOPROLOL FUMARATE 5 MG PO TABS: 2.5000 mg | ORAL_TABLET | Freq: Every day | ORAL | Status: DC

## 2013-07-22 NOTE — Progress Notes (Signed)
Patient ID: [name], female   DOB: 1961/06/03, 52 y.o.   MRN: [id number] Patient Demographics  [name], is a 52 y.o. female  MWU:[id number]  VOZ:[id number]  DOB - [birth year]  Chief Complaint  Patient presents with  . Establish Care  . Hypertension  . Near Syncope        Subjective:   [name] today is here to establish primary care.  Patient is a 52 year old female with history of COPD, former smoker, hypertension, depression presented to establish care. She reports that her COPD is managed by [facility] pulmonology, Dr. Delton Coombes. She reports that she's been having back pain for last one year, now has noticed that occasionally her legs give away and she falls. Her first back pain as the low back pain in the lumbar spine. Patient has No headache, No chest pain, No abdominal pain - No Nausea, No tingling or numbness, No Cough - SOB.   Objective:    Filed Vitals:   07/22/13 1555  BP: 127/83  Pulse: 75  Temp: 98.4 F (36.9 C)  TempSrc: Oral  Resp: 16  Height: 5\' 3"  (1.6 m)  Weight: 176 lb (79.833 kg)  SpO2: 97%     ALLERGIES:   Allergies  Allergen Reactions  . Sulfa Antibiotics Nausea And Vomiting  . Sulfonamide Derivatives Nausea And Vomiting    REACTION: n/v    PAST MEDICAL HISTORY: Past Medical History  Diagnosis Date  . Asthma   . Anxiety   . COPD (chronic obstructive pulmonary disease)   . Angina   . Shortness of breath   . Depression   . Hypertension   . GERD (gastroesophageal reflux disease)   . Headache(784.0)   . Arthritis   . Peripheral vascular disease   . Migraine     PAST SURGICAL HISTORY: Past Surgical History  Procedure Laterality Date  . Neck surgery    . Toe surgery    . Abdominal hysterectomy    . Tubal ligation    . Kidney stone surgery    . Hemorrhoid surgery    . Incise and drain abcess    . Back surgery    . Diagnostic laparoscopy    . Colonoscopy  12/20/2011    Procedure: COLONOSCOPY;  Surgeon:  Freddy Jaksch, MD;  Location: [facility];  Service: Endoscopy;  Laterality: N/A;  . Colonoscopy  03/05/2012    Procedure: COLONOSCOPY;  Surgeon: Freddy Jaksch, MD;  Location: [facility];  Service: Endoscopy;  Laterality: N/A;    FAMILY HISTORY: Family History  Problem Relation Age of Onset  . Diabetes Mother   . COPD Mother   . Heart disease Mother   . Diabetes Father   . Kidney disease Father   . Cancer Father   . Anesthesia problems Father   . Diabetes Sister   . Hypertension Sister   . Diabetes Brother   . Sleep apnea Brother   . Heart disease Sister   . Diabetes Sister   . Heart disease Brother     MEDICATIONS AT HOME: Prior to Admission medications   Medication Sig Start Date End Date Taking? Authorizing Provider  albuterol (PROVENTIL HFA;VENTOLIN HFA) 108 (90 BASE) MCG/ACT inhaler Inhale 1-2 puffs into the lungs every 6 (six) hours as needed for wheezing. 08/10/12 08/10/13 Yes Trevor Mace, PA-C  albuterol (PROVENTIL) (5 MG/ML) 0.5% nebulizer solution Take 2.5 mg by nebulization at bedtime as needed. As needed for shortness of breath    Yes Historical Provider,  MD  aspirin 325 MG EC tablet Take 325 mg by mouth daily.   Yes Historical Provider, MD  atenolol (TENORMIN) 50 MG tablet Take 1 tablet (50 mg total) by mouth daily. 07/22/13  Yes Ripudeep Jenna Luo, MD  bisoprolol (ZEBETA) 5 MG tablet Take 0.5 tablets (2.5 mg total) by mouth daily. 07/22/13  Yes Ripudeep Jenna Luo, MD  citalopram (CELEXA) 10 MG tablet Take 2 tablets (20 mg total) by mouth daily. 07/22/13  Yes Ripudeep Jenna Luo, MD  diphenhydrAMINE (BENADRYL) 25 MG tablet Take 25 mg by mouth every 6 (six) hours as needed. As needed for sleep and/or itching    Yes Historical Provider, MD  ergocalciferol (VITAMIN D2) 50000 UNITS capsule Take 1 capsule (50,000 Units total) by mouth once a week. 07/22/13  Yes Ripudeep Jenna Luo, MD  hydrochlorothiazide (HYDRODIURIL) 25 MG tablet Take 1 tablet (25 mg total) by mouth daily. 07/22/13  Yes  Ripudeep Jenna Luo, MD  mometasone-formoterol (DULERA) 200-5 MCG/ACT AERO Inhale 2 puffs into the lungs 2 (two) times daily. 12/29/12  Yes Vilinda Blanks Minor, NP  traMADol (ULTRAM) 50 MG tablet Take 1 tablet (50 mg total) by mouth every 6 (six) hours as needed. For pain. 07/22/13  Yes Ripudeep Jenna Luo, MD  meloxicam (MOBIC) 15 MG tablet Take 1 tablet (15 mg total) by mouth daily. 07/22/13   Ripudeep Jenna Luo, MD  oxycodone (OXY-IR) 5 MG capsule Take 5 mg by mouth every 4 (four) hours as needed. pain    Historical Provider, MD    REVIEW OF SYSTEMS:  Constitutional:   No   Fevers, chills, fatigue.  HEENT:    No headaches, Sore throat,   Cardio-vascular: No chest pain,  Orthopnea, swelling in lower extremities, anasarca, palpitations  GI:  No abdominal pain, nausea, vomiting, diarrhea  Resp: No shortness of breath,  No coughing up of blood.No cough.No wheezing.  Skin:  no rash or lesions.  GU:  no dysuria, change in color of urine, no urgency or frequency.  No flank pain.  Musculoskeletal: Please see history of present illness  Psych: No change in mood or affect. No depression or anxiety.  No memory loss.   Exam  General appearance :Awake, alert, NAD, Speech Clear. HEENT: Atraumatic and Normocephalic, PERLA Neck: supple, no JVD. No cervical lymphadenopathy.  Chest: clear to auscultation bilaterally, no wheezing, rales or rhonchi CVS: S1 S2 regular, no murmurs.  Abdomen: soft, NBS, NT, ND, no gaurding, rigidity or rebound. Extremities: No cyanosis, clubbing, B/L Lower Ext shows no edema,  Neurology: Awake alert, and oriented X 3, CN II-XII intact, Non focal Skin:No Rash or lesions Wounds: N/A Back: Lumbar spine tenderness noted at L3-4, L4-L5 region   Data Review   Basic Metabolic Panel: No results found for this basename: NA, K, CL, CO2, GLUCOSE, BUN, CREATININE, CALCIUM, MG, PHOS,  in the last 168 hours Liver Function Tests: No results found for this basename: AST, ALT, ALKPHOS,  BILITOT, PROT, ALBUMIN,  in the last 168 hours  CBC: No results found for this basename: WBC, NEUTROABS, HGB, HCT, MCV, PLT,  in the last 168 hours ------------------------------------------------------------------------------------------------------------------ No results found for this basename: HGBA1C,  in the last 72 hours ------------------------------------------------------------------------------------------------------------------ No results found for this basename: CHOL, HDL, LDLCALC, TRIG, CHOLHDL, LDLDIRECT,  in the last 72 hours ------------------------------------------------------------------------------------------------------------------ No results found for this basename: TSH, T4TOTAL, FREET3, T3FREE, THYROIDAB,  in the last 72 hours ------------------------------------------------------------------------------------------------------------------ No results found for this basename: VITAMINB12, FOLATE, FERRITIN, TIBC, IRON, RETICCTPCT,  in  the last 72 hours  Coagulation profile  No results found for this basename: INR, PROTIME,  in the last 168 hours    Assessment & Plan   Active Problems: Chronic back pain: Patient also reports pain in the neck, shoulders, hips, thoracic spine, lumbar spine. She has had falls due to her legs giving way. Per patient she was told in the past that she may have fibromyalgia - Will obtain ESR, CRP. - Obtain x-rays of the cervical spine, thoracic spine, lumbar spine, rule out any compression fractures or DJD. Pending results, may need to proceed with MRI of the spine. - Continue tramadol, placed on Mobic 15mg  daily  Hypertension - refilled Tenormin, zabeta, HCTZ    COPD: Patient is managed by [facility] pulmonology, Dr Delton Coombes  Recommendations: Xray Spine, ESR, CRP  Follow-up in 1 month for the x-ray results.   RAI,RIPUDEEP M.D. 07/22/2013, 4:14 PM

## 2013-07-22 NOTE — Progress Notes (Signed)
Patient presents to establish care and get refills; also complains of near syncopal episodes during the past 6-8 months.

## 2013-07-23 ENCOUNTER — Telehealth: Payer: Self-pay

## 2013-07-23 ENCOUNTER — Ambulatory Visit (HOSPITAL_COMMUNITY)
Admission: RE | Admit: 2013-07-23 | Discharge: 2013-07-23 | Disposition: A | Discharge status: Home / Self Care | Payer: Medicaid Other | Source: Ambulatory Visit | Attending: Internal Medicine | Admitting: Internal Medicine

## 2013-07-23 DIAGNOSIS — M545 Low back pain, unspecified: Secondary | ICD-10-CM | POA: Insufficient documentation

## 2013-07-23 DIAGNOSIS — M5090 Cervical disc disorder, unspecified, unspecified cervical region: Secondary | ICD-10-CM | POA: Insufficient documentation

## 2013-07-23 DIAGNOSIS — J449 Chronic obstructive pulmonary disease, unspecified: Secondary | ICD-10-CM

## 2013-07-23 DIAGNOSIS — M47812 Spondylosis without myelopathy or radiculopathy, cervical region: Secondary | ICD-10-CM | POA: Insufficient documentation

## 2013-07-23 DIAGNOSIS — M549 Dorsalgia, unspecified: Secondary | ICD-10-CM

## 2013-07-23 DIAGNOSIS — M546 Pain in thoracic spine: Secondary | ICD-10-CM | POA: Insufficient documentation

## 2013-07-23 DIAGNOSIS — M542 Cervicalgia: Secondary | ICD-10-CM | POA: Insufficient documentation

## 2013-07-23 DIAGNOSIS — Z981 Arthrodesis status: Secondary | ICD-10-CM | POA: Insufficient documentation

## 2013-07-23 DIAGNOSIS — I10 Essential (primary) hypertension: Secondary | ICD-10-CM

## 2013-07-23 LAB — SEDIMENTATION RATE: Sed Rate: 8 mm/hr (ref 0–22)

## 2013-07-23 NOTE — Telephone Encounter (Signed)
Patient is going to be seen tomorrow for a Pulmonary Function test and office needs NPI auth for the visit.

## 2013-07-23 NOTE — Telephone Encounter (Signed)
Called office and informed that pt is not curently a pt of [facility]. Wyatt Haste, RN-BSN

## 2013-07-24 ENCOUNTER — Encounter (INDEPENDENT_AMBULATORY_CARE_PROVIDER_SITE_OTHER): Payer: Medicaid Other

## 2013-07-24 DIAGNOSIS — R0602 Shortness of breath: Secondary | ICD-10-CM

## 2013-07-24 LAB — PULMONARY FUNCTION TEST

## 2013-07-27 ENCOUNTER — Telehealth: Payer: Self-pay | Admitting: Emergency Medicine

## 2013-07-27 MED ORDER — FLUTICASONE PROPIONATE 50 MCG/ACT NA SUSP: NASAL | Status: DC

## 2013-07-27 NOTE — Telephone Encounter (Signed)
Spoke w patient-- Patient c/o sinus pressure, headache and facial discomfort x 3days Denies any F/C/S or cough Patient states she has tried OTC sudafed  And BC for the headaches which has not provided her w much relief Dr. Delton Coombes please advise, thank you!  Last OV: 06/16/13 w Dr. Sherene Sires Next OV:8/19 w Dr. Delton Coombes  Allergies  Allergen Reactions  . Sulfa Antibiotics Nausea And Vomiting  . Sulfonamide Derivatives Nausea And Vomiting    REACTION: n/v

## 2013-07-27 NOTE — Telephone Encounter (Signed)
Would like to try chlorpheniramine or brompheniramine OTC decongestant + fluticasone nasal spray, 2 sprays each side 1-2x a day, 3 RF. Also she could try NSW daily if she is willing to do this. Have her call us if fever, purulent mucous from sinuses, etc.

## 2013-07-27 NOTE — Telephone Encounter (Signed)
Called spoke with patient, advised of (and spelled) RB's recs as stated below.  Pt verbalized her understanding and denied any additional questions / concerns.  If her symptoms do not improve or worsen prior to her 8.19.14 appt w/ RB, she is to call the office back.  Rx sent to verified pharmacy.  Nothing further needed at this time; will sign off.

## 2013-08-04 ENCOUNTER — Ambulatory Visit: Payer: Self-pay | Admitting: Emergency Medicine

## 2013-08-07 ENCOUNTER — Ambulatory Visit (INDEPENDENT_AMBULATORY_CARE_PROVIDER_SITE_OTHER): Payer: Self-pay | Admitting: Emergency Medicine

## 2013-08-07 ENCOUNTER — Encounter: Payer: Self-pay | Admitting: Emergency Medicine

## 2013-08-07 VITALS — BP 110/62 | HR 59 | Temp 98.6°F | Ht 63.0 in | Wt 175.0 lb

## 2013-08-07 DIAGNOSIS — J449 Chronic obstructive pulmonary disease, unspecified: Secondary | ICD-10-CM

## 2013-08-07 NOTE — Assessment & Plan Note (Signed)
Continue your Dulera twice a day Start Spiriva once a day until next visit Use your albuterol as needed.  Follow with Dr Fradel Baldonado in 1 month  

## 2013-08-07 NOTE — Progress Notes (Signed)
52 yo former smoker with hx HTN, migraine HA, asthma dx in 2000 after a hospitalization for dyspnea. No asthma as a child. Had been managed on Advair 250 (ran out), now on Dulera + SABA since hosp. She was hospitalized 1/7 - 1/13 for an acute exacerbation.  She has been doing fairly well, is having some stabbing pains with exertion (prior cards eval was reassuring). She had gone back to smoking since last Summer, just quit again when hospitalized. She is weaning pred now. Uses albuterol every day at least once. She is planning to restart exercise routine. She is taking benadryl for itching. She is having a lot of nasal gtt and UA irritation.    Acute OV 05/28/13 -- she has been having more problems with congestion, more problems with wheeze, cough, dyspnea. She was on Dulera, nasonex - hasn't been able to take in months because she lost her job.   ROV 08/07/13 -- f/u COPD, rhinitis, cough. Was seen since last visit by Dr Sherene Sires with wheeze, sputum, tightness. Was not taking her BD's - couldn;t afford. Was treated for sinusitis with augmentin and pred.  Also changed to bystolic. 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 Using dulera bid, albuterol 2-3 x a day.    Filed Vitals:   08/07/13 1512  BP: 110/62  Pulse: 59  Temp: 98.6 F (37 C)   Gen: Pleasant, well-nourished, in no distress,  normal affect  ENT: No lesions,  mouth clear,  oropharynx clear, very congested, hoarse voice  Neck: No JVD, no TMG, no carotid bruits  Lungs: No use of accessory muscles,  clear without rales or rhonchi  Cardiovascular: RRR, heart sounds normal, no murmur or gallops, no peripheral edema  Musculoskeletal: No deformities, no cyanosis or clubbing  Neuro: alert, non focal  Skin: Warm, no lesions or rashes  No problem-specific assessment & plan notes found for this encounter.

## 2013-08-07 NOTE — Patient Instructions (Addendum)
Continue your Elwin Sleight twice a day Start Spiriva once a day until next visit Use your albuterol as needed.  Follow with Dr Delton Coombes in 1 month

## 2013-08-18 ENCOUNTER — Encounter: Payer: Self-pay | Admitting: Emergency Medicine

## 2013-08-24 ENCOUNTER — Ambulatory Visit: Payer: Medicaid Other

## 2013-09-09 ENCOUNTER — Ambulatory Visit: Payer: Self-pay | Admitting: Internal Medicine

## 2013-09-10 ENCOUNTER — Ambulatory Visit: Payer: Self-pay | Admitting: Emergency Medicine

## 2013-09-11 ENCOUNTER — Ambulatory Visit: Payer: Medicare Other | Attending: Internal Medicine | Admitting: Internal Medicine

## 2013-09-11 ENCOUNTER — Encounter: Payer: Self-pay | Admitting: Internal Medicine

## 2013-09-11 VITALS — BP 108/69 | HR 63 | Temp 99.1°F | Resp 18 | Wt 174.0 lb

## 2013-09-11 DIAGNOSIS — J449 Chronic obstructive pulmonary disease, unspecified: Secondary | ICD-10-CM | POA: Diagnosis not present

## 2013-09-11 DIAGNOSIS — M549 Dorsalgia, unspecified: Secondary | ICD-10-CM | POA: Diagnosis not present

## 2013-09-11 DIAGNOSIS — J4489 Other specified chronic obstructive pulmonary disease: Secondary | ICD-10-CM | POA: Diagnosis not present

## 2013-09-11 DIAGNOSIS — I10 Essential (primary) hypertension: Secondary | ICD-10-CM | POA: Insufficient documentation

## 2013-09-11 DIAGNOSIS — J45909 Unspecified asthma, uncomplicated: Secondary | ICD-10-CM

## 2013-09-11 DIAGNOSIS — E559 Vitamin D deficiency, unspecified: Secondary | ICD-10-CM | POA: Diagnosis not present

## 2013-09-11 LAB — LIPID PANEL
Cholesterol: 156 mg/dL (ref 0–200)
HDL: 52 mg/dL (ref >39)
LDL Cholesterol: 95 mg/dL (ref 0–99)
Total CHOL/HDL Ratio: 3 Ratio
Triglycerides: 45 mg/dL (ref <150)
VLDL: 9 mg/dL (ref 0–40)

## 2013-09-11 LAB — TSH: TSH: 1.339 u[IU]/mL (ref 0.350–4.500)

## 2013-09-11 MED ORDER — MOMETASONE FURO-FORMOTEROL FUM 200-5 MCG/ACT IN AERO: 2.0000 | INHALATION_SPRAY | Freq: Two times a day (BID) | RESPIRATORY_TRACT | Status: DC

## 2013-09-11 MED ORDER — ALBUTEROL SULFATE (5 MG/ML) 0.5% IN NEBU: 2.5000 mg | INHALATION_SOLUTION | Freq: Every evening | RESPIRATORY_TRACT | Status: DC | PRN

## 2013-09-11 MED ORDER — ASPIRIN 325 MG PO TBEC: 325.0000 mg | DELAYED_RELEASE_TABLET | Freq: Every day | ORAL | Status: DC

## 2013-09-11 MED ORDER — ERGOCALCIFEROL 1.25 MG (50000 UT) PO CAPS: 50000.0000 [IU] | ORAL_CAPSULE | ORAL | Status: DC

## 2013-09-11 MED ORDER — DIPHENHYDRAMINE HCL 25 MG PO TABS: 25.0000 mg | ORAL_TABLET | Freq: Four times a day (QID) | ORAL | Status: DC | PRN

## 2013-09-11 MED ORDER — BISOPROLOL FUMARATE 5 MG PO TABS: 2.5000 mg | ORAL_TABLET | Freq: Every day | ORAL | Status: DC

## 2013-09-11 MED ORDER — CITALOPRAM HYDROBROMIDE 10 MG PO TABS: 20.0000 mg | ORAL_TABLET | Freq: Every day | ORAL | Status: DC

## 2013-09-11 MED ORDER — TRAMADOL HCL 50 MG PO TABS: 50.0000 mg | ORAL_TABLET | Freq: Four times a day (QID) | ORAL | Status: DC | PRN

## 2013-09-11 MED ORDER — ATENOLOL 50 MG PO TABS: 50.0000 mg | ORAL_TABLET | Freq: Every day | ORAL | Status: DC

## 2013-09-11 MED ORDER — FLUTICASONE PROPIONATE 50 MCG/ACT NA SUSP: NASAL | Status: DC

## 2013-09-11 MED ORDER — MELOXICAM 15 MG PO TABS: 15.0000 mg | ORAL_TABLET | Freq: Every day | ORAL | Status: DC

## 2013-09-11 MED ORDER — HYDROCHLOROTHIAZIDE 25 MG PO TABS: 25.0000 mg | ORAL_TABLET | Freq: Every day | ORAL | Status: DC

## 2013-09-11 NOTE — Progress Notes (Signed)
Patient ID: [name], female   DOB: [birth year], 52 y.o.   MRN: [id number]  CC: Followup  HPI: 52 year old female with past medical history of asthma, COPD, depression, arthritis and hypertension who presented to clinic for followup. Patient reports chronic neck, bilateral shoulder and back pain. She takes tramadol but that does not provide her significant pain relief. Patient reports being able to ambulate without the cane or walker. She does have however limited in terms of the distance she can walk for approximately 100 feet. No chest pain or palpitations. No abdominal pain. No fever or chills. No cough.  Allergies  Allergen Reactions  . Sulfa Antibiotics Nausea And Vomiting  . Sulfonamide Derivatives Nausea And Vomiting    REACTION: n/v   Past Medical History  Diagnosis Date  . Asthma   . Anxiety   . COPD (chronic obstructive pulmonary disease)   . Angina   . Shortness of breath   . Depression   . Hypertension   . GERD (gastroesophageal reflux disease)   . Headache(784.0)   . Arthritis   . Peripheral vascular disease   . Migraine    Current Outpatient Prescriptions on File Prior to Visit  Medication Sig Dispense Refill  . albuterol (PROVENTIL HFA;VENTOLIN HFA) 108 (90 BASE) MCG/ACT inhaler Inhale 1-2 puffs into the lungs every 6 (six) hours as needed for wheezing.  1 Inhaler  0   No current facility-administered medications on file prior to visit.   Family History  Problem Relation Age of Onset  . Diabetes Mother   . COPD Mother   . Heart disease Mother   . Diabetes Father   . Kidney disease Father   . Cancer Father   . Anesthesia problems Father   . Diabetes Sister   . Hypertension Sister   . Diabetes Brother   . Sleep apnea Brother   . Heart disease Sister   . Diabetes Sister   . Heart disease Brother    History   Social History  . Marital Status: Divorced    Spouse Name: N/A    Number of Children: N/A  . Years of Education: N/A   Occupational  History  . Not on file.   Social History Main Topics  . Smoking status: Former Smoker    Quit date: 04/20/2011  . Smokeless tobacco: Never Used  . Alcohol Use: No  . Drug Use: No  . Sexual Activity: Not Currently    Birth Control/ Protection: Surgical   Other Topics Concern  . Not on file   Social History Narrative  . No narrative on file    Review of Systems  Constitutional: Negative for fever, chills, diaphoresis, activity change, appetite change and fatigue.  HENT: Negative for ear pain, nosebleeds, congestion, facial swelling, rhinorrhea, neck pain, neck stiffness and ear discharge.   Eyes: Negative for pain, discharge, redness, itching and visual disturbance.  Respiratory: Negative for cough, choking, chest tightness, shortness of breath, wheezing and stridor.   Cardiovascular: Negative for chest pain, palpitations and leg swelling.  Gastrointestinal: Negative for abdominal distention.  Genitourinary: Negative for dysuria, urgency, frequency, hematuria, flank pain, decreased urine volume, difficulty urinating and dyspareunia.  Musculoskeletal: Positive for back pain, negative joint swelling, arthralgias and gait problem.  Neurological: Negative for dizziness, tremors, seizures, syncope, facial asymmetry, speech difficulty, weakness, light-headedness, numbness and headaches.  Hematological: Negative for adenopathy. Does not bruise/bleed easily.  Psychiatric/Behavioral: Negative for hallucinations, behavioral problems, confusion, dysphoric mood, decreased concentration and agitation.    Objective:  Filed Vitals:   09/11/13 1037  BP: 108/69  Pulse: 63  Temp: 99.1 F (37.3 C)  Resp: 18    Physical Exam  Constitutional: Appears well-developed and well-nourished. No distress.  HENT: Normocephalic. External right and left ear normal. Oropharynx is clear and moist.  Eyes: Conjunctivae and EOM are normal. PERRLA, no scleral icterus.  Neck: Normal ROM. Neck supple. No JVD.  No tracheal deviation. No thyromegaly.  CVS: RRR, S1/S2 +, no murmurs, no gallops, no carotid bruit.  Pulmonary: Effort and breath sounds normal, no stridor, rhonchi, wheezes, rales.  Abdominal: Soft. BS +,  no distension, tenderness, rebound or guarding.  Musculoskeletal: Normal range of motion. No edema and no tenderness.  Lymphadenopathy: No lymphadenopathy noted, cervical, inguinal. Neuro: Alert. Normal reflexes, muscle tone coordination. No cranial nerve deficit. Skin: Skin is warm and dry. No rash noted. Not diaphoretic. No erythema. No pallor.  Psychiatric: Normal mood and affect. Behavior, judgment, thought content normal.   Lab Results  Component Value Date   WBC 27.0* 12/26/2012   HGB 13.0 12/26/2012   HCT 38.8 12/26/2012   MCV 90.2 12/26/2012   PLT 273 12/26/2012   Lab Results  Component Value Date   CREATININE 0.69 12/26/2012   BUN 20 12/26/2012   NA 133* 12/26/2012   K 4.4 12/26/2012   CL 99 12/26/2012   CO2 25 12/26/2012    No results found for this basename: HGBA1C   Lipid Panel     Component Value Date/Time   CHOL 155 07/12/2010 2303   TRIG 68 07/12/2010 2303   HDL 53 07/12/2010 2303   CHOLHDL 2.9 Ratio 07/12/2010 2303   VLDL 14 07/12/2010 2303   LDLCALC 88 07/12/2010 2303       Assessment and plan:   Patient Active Problem List   Diagnosis Date Noted  . COPD 08/24/2009    Priority: Medium - Stable, continue albuterol inhaler and nebulizer   . HYPERTENSION 05/12/2009    Priority: Medium - We have discussed target BP range - I have advised pt to check BP regularly and to call us back if the numbers are higher than 140/90 - discussed the importance of compliance with medical therapy and diet  - Continue atenolol and bisoprolol. Peak heart rate is less than 60 then we will consider stopping either one or the other medication.   . ASTHMA 05/12/2009    Priority: Medium - Stable   . Back pain - Referral provided to pain management clinic  07/22/2013

## 2013-09-11 NOTE — Patient Instructions (Signed)

## 2013-09-11 NOTE — Progress Notes (Signed)
Pt is here for a f/u and to go over recent x-rays Still c/o generalized joint pain  Alert w/no signs of acute distress.

## 2013-09-12 LAB — VITAMIN D 25 HYDROXY (VIT D DEFICIENCY, FRACTURES): Vit D, 25-Hydroxy: 45 ng/mL (ref 30–89)

## 2013-09-30 DIAGNOSIS — F319 Bipolar disorder, unspecified: Secondary | ICD-10-CM | POA: Diagnosis not present

## 2013-10-19 ENCOUNTER — Ambulatory Visit: Payer: Self-pay | Admitting: Emergency Medicine

## 2013-11-17 ENCOUNTER — Encounter (INDEPENDENT_AMBULATORY_CARE_PROVIDER_SITE_OTHER): Payer: Self-pay

## 2013-11-17 ENCOUNTER — Ambulatory Visit: Payer: Medicare Other | Attending: Internal Medicine | Admitting: Internal Medicine

## 2013-11-17 VITALS — BP 147/89 | HR 68 | Temp 98.9°F | Resp 14 | Ht 63.0 in | Wt 172.8 lb

## 2013-11-17 DIAGNOSIS — M549 Dorsalgia, unspecified: Secondary | ICD-10-CM

## 2013-11-17 LAB — POCT URINE PREGNANCY: Preg Test, Ur: NEGATIVE

## 2013-11-17 MED ORDER — TRAMADOL HCL 50 MG PO TABS
50.0000 mg | ORAL_TABLET | Freq: Four times a day (QID) | ORAL | Status: DC | PRN
Start: 2013-11-17 — End: 2017-09-16

## 2013-11-17 MED ORDER — CYCLOBENZAPRINE HCL 5 MG PO TABS: 5.0000 mg | ORAL_TABLET | Freq: Three times a day (TID) | ORAL | Status: DC | PRN

## 2013-11-17 NOTE — Progress Notes (Unsigned)
Pt is here for pain in her lower back down to the calf of the Lt leg; pain scale on a level 10 today; occurring since Thanksgiving. Pt has appointment to see pain management on December 23rd. Pt complains of constant pain; hard to walk. Pain in back of neck, headaches, and ear ache.

## 2013-11-17 NOTE — Progress Notes (Unsigned)
Patient ID: [name], female   DOB: [birth year], 52 y.o.   MRN: [id number]  Patient Demographics  [name], is a 52 y.o. female  YNW:[id number]  MVH:[id number]  DOB - 1961-09-13  No chief complaint on file.       Subjective:   Jamal Collin with History of COPD, ongoing smoking, chronic back pain is here for getting pain medications, she says that she has back pain since Thanksgiving 2 days ago, however I told her that I reviewed her chart and it appears that her back pain is more chronic, patient then corrected herself saying that back pain has been ongoing for one year. Denies any lower extremity weakness, denies any bowel bladder incontinence. She had an x-ray for L-spine few months ago which was unremarkable, she denies any difficulty in ablating, does not use walker or cane.  Denies any subjective complaints except as above, no active headache, no chest abdominal pain at this time, not short of breath. No focal weakness which is new.    Objective:    Patient Active Problem List   Diagnosis Date Noted  . Back pain 07/22/2013  . COPD 08/24/2009  . HEADACHE 08/24/2009  . HYPERTENSION 05/12/2009  . ALLERGIC RHINITIS 05/12/2009  . ASTHMA 05/12/2009     Filed Vitals:   11/17/13 0909  BP: 147/89  Pulse: 68  Temp: 98.9 F (37.2 C)  TempSrc: Oral  Resp: 14  Height: 5\' 3"  (1.6 m)  Weight: 172 lb 12.8 oz (78.382 kg)  SpO2: 98%     Exam   Awake Alert, Oriented X 3, No new F.N deficits, Normal affect [place].AT,PERRAL Supple Neck,No JVD, No cervical lymphadenopathy appriciated.  Symmetrical Chest wall movement, Good air movement bilaterally, CTAB RRR,No Gallops,Rubs or new Murmurs, No Parasternal Heave +ve B.Sounds, Abd Soft, Non tender, No organomegaly appriciated, No rebound - guarding or rigidity. No Cyanosis, Clubbing or edema, No new Rash or bruise No point tenderness on L-spine, she has generalized tenderness all around her lower back. No  weakness in lower extremities, plantars bilaterally symmetrical and downgoing.    Data Review    No results found for this basename: HGBA1C    Lab Results  Component Value Date   CHOL 156 09/11/2013   HDL 52 09/11/2013   LDLCALC 95 09/11/2013   TRIG 45 09/11/2013   CHOLHDL 3.0 09/11/2013    Lab Results  Component Value Date   TSH 1.339 09/11/2013    No results found for this basename: PSA      Prior to Admission medications   Medication Sig Start Date End Date Taking? Authorizing Provider  albuterol (PROVENTIL) (5 MG/ML) 0.5% nebulizer solution Take 0.5 mLs (2.5 mg total) by nebulization at bedtime as needed. As needed for shortness of breath 09/11/13  Yes Alison Murray, MD  aspirin 325 MG EC tablet Take 1 tablet (325 mg total) by mouth daily. 09/11/13  Yes Alison Murray, MD  atenolol (TENORMIN) 50 MG tablet Take 1 tablet (50 mg total) by mouth daily. 09/11/13  Yes Alison Murray, MD  bisoprolol (ZEBETA) 5 MG tablet Take 0.5 tablets (2.5 mg total) by mouth daily. 09/11/13  Yes Alison Murray, MD  citalopram (CELEXA) 10 MG tablet Take 2 tablets (20 mg total) by mouth daily. 09/11/13  Yes Alison Murray, MD  ergocalciferol (VITAMIN D2) 50000 UNITS capsule Take 1 capsule (50,000 Units total) by mouth once a week. 09/11/13  Yes Alison Murray, MD  fluticasone [facility]) 50  MCG/ACT nasal spray 2 sprays each nostril 1-2 times daily 09/11/13  Yes Alison Murray, MD  hydrochlorothiazide (HYDRODIURIL) 25 MG tablet Take 1 tablet (25 mg total) by mouth daily. 09/11/13  Yes Alison Murray, MD  meloxicam (MOBIC) 15 MG tablet Take 1 tablet (15 mg total) by mouth daily. 09/11/13  Yes Alison Murray, MD  mometasone-formoterol [facility]) 200-5 MCG/ACT AERO Inhale 2 puffs into the lungs 2 (two) times daily. 09/11/13  Yes Alison Murray, MD  traMADol (ULTRAM) 50 MG tablet Take 1 tablet (50 mg total) by mouth every 6 (six) hours as needed. For pain. 11/17/13  Yes Leroy Sea, MD  albuterol (PROVENTIL HFA;VENTOLIN  HFA) 108 (90 BASE) MCG/ACT inhaler Inhale 1-2 puffs into the lungs every 6 (six) hours as needed for wheezing. 08/10/12 08/10/13  Trevor Mace, PA-C  cyclobenzaprine (FLEXERIL) 5 MG tablet Take 1 tablet (5 mg total) by mouth 3 (three) times daily as needed for muscle spasms. 11/17/13   Leroy Sea, MD     Assessment & Plan    Chronic low back pain. Noted to have a unremarkable L-spine x-ray recently, however complains that back pain is getting worse and radiating to both lower extremities, will order MRI of the L-spine. She already has an appointment with pain clinic in 2 weeks advised to keep that appointment. We'll followup on the MRI results. Refilled tramadol and give her low-dose Flexeril.   History of COPD with ongoing smoking. Counseled to quit smoking. Follows with pulmonary physician Dr. Sherene Sires for her COPD no acute issues.  Routine health maintenance.     Leroy Sea M.D on 11/17/2013 at 9:33 AM    Patient was given clear instructions to go to ER or return to the clinic if symptoms don't improve, worsen or new problems develop. Patient verbalized understanding. Patient was told to call to get lab results if hasn't heard anything in the next week.

## 2013-11-26 ENCOUNTER — Ambulatory Visit (HOSPITAL_COMMUNITY)
Admission: RE | Admit: 2013-11-26 | Discharge: 2013-11-26 | Disposition: A | Discharge status: Home / Self Care | Payer: Medicare Other | Source: Ambulatory Visit | Attending: Internal Medicine | Admitting: Internal Medicine

## 2013-11-26 DIAGNOSIS — M25559 Pain in unspecified hip: Secondary | ICD-10-CM | POA: Diagnosis not present

## 2013-11-26 DIAGNOSIS — M545 Low back pain, unspecified: Secondary | ICD-10-CM | POA: Diagnosis not present

## 2013-11-26 DIAGNOSIS — M5126 Other intervertebral disc displacement, lumbar region: Secondary | ICD-10-CM | POA: Diagnosis not present

## 2013-11-26 DIAGNOSIS — M549 Dorsalgia, unspecified: Secondary | ICD-10-CM

## 2013-12-08 DIAGNOSIS — M545 Low back pain, unspecified: Secondary | ICD-10-CM | POA: Diagnosis not present

## 2013-12-08 DIAGNOSIS — G608 Other hereditary and idiopathic neuropathies: Secondary | ICD-10-CM | POA: Diagnosis not present

## 2013-12-08 DIAGNOSIS — R209 Unspecified disturbances of skin sensation: Secondary | ICD-10-CM | POA: Diagnosis not present

## 2013-12-08 DIAGNOSIS — Z79899 Other long term (current) drug therapy: Secondary | ICD-10-CM | POA: Diagnosis not present

## 2013-12-08 DIAGNOSIS — R42 Dizziness and giddiness: Secondary | ICD-10-CM | POA: Diagnosis not present

## 2013-12-08 DIAGNOSIS — M629 Disorder of muscle, unspecified: Secondary | ICD-10-CM | POA: Diagnosis not present

## 2013-12-08 DIAGNOSIS — G541 Lumbosacral plexus disorders: Secondary | ICD-10-CM | POA: Diagnosis not present

## 2013-12-08 DIAGNOSIS — M542 Cervicalgia: Secondary | ICD-10-CM | POA: Diagnosis not present

## 2013-12-15 ENCOUNTER — Ambulatory Visit: Payer: Medicare Other | Attending: Internal Medicine | Admitting: Internal Medicine

## 2013-12-15 ENCOUNTER — Encounter: Payer: Self-pay | Admitting: Internal Medicine

## 2013-12-15 VITALS — BP 126/83 | HR 54 | Temp 98.1°F | Resp 14 | Ht 63.0 in | Wt 174.0 lb

## 2013-12-15 DIAGNOSIS — N9089 Other specified noninflammatory disorders of vulva and perineum: Secondary | ICD-10-CM | POA: Diagnosis not present

## 2013-12-15 DIAGNOSIS — E119 Type 2 diabetes mellitus without complications: Secondary | ICD-10-CM

## 2013-12-15 DIAGNOSIS — L738 Other specified follicular disorders: Secondary | ICD-10-CM | POA: Insufficient documentation

## 2013-12-15 LAB — POCT GLYCOSYLATED HEMOGLOBIN (HGB A1C): Hemoglobin A1C: 5.5

## 2013-12-15 MED ORDER — BACITRACIN-NEOMYCIN-POLYMYXIN 400-5-5000 EX OINT: 1.0000 "application " | TOPICAL_OINTMENT | Freq: Two times a day (BID) | CUTANEOUS | Status: DC

## 2013-12-15 MED ORDER — DOXYCYCLINE HYCLATE 100 MG PO TABS: 100.0000 mg | ORAL_TABLET | Freq: Two times a day (BID) | ORAL | Status: DC

## 2013-12-15 NOTE — Progress Notes (Signed)
Patient ID: [name], female   DOB: 15-Jul-1961, 52 y.o.   MRN: [id number]   CC:  HPI: 52 year old female here for evaluation of swelling, possible abscess in her  Groin. The patient has noticed 2   areas of folliculitis. One on her right labia, and the other one inside her vagina. She complains of itching but no normal vaginal discharge. No redness or erythema.   Allergies  Allergen Reactions  . Sulfa Antibiotics Nausea And Vomiting  . Sulfonamide Derivatives Nausea And Vomiting    REACTION: n/v   Past Medical History  Diagnosis Date  . Asthma   . Anxiety   . COPD (chronic obstructive pulmonary disease)   . Angina   . Shortness of breath   . Depression   . Hypertension   . GERD (gastroesophageal reflux disease)   . Headache(784.0)   . Arthritis   . Peripheral vascular disease   . Migraine    Current Outpatient Prescriptions on File Prior to Visit  Medication Sig Dispense Refill  . albuterol (PROVENTIL) (5 MG/ML) 0.5% nebulizer solution Take 0.5 mLs (2.5 mg total) by nebulization at bedtime as needed. As needed for shortness of breath  20 mL  3  . aspirin 325 MG EC tablet Take 1 tablet (325 mg total) by mouth daily.  30 tablet  3  . atenolol (TENORMIN) 50 MG tablet Take 1 tablet (50 mg total) by mouth daily.  30 tablet  4  . bisoprolol (ZEBETA) 5 MG tablet Take 0.5 tablets (2.5 mg total) by mouth daily.  30 tablet  4  . citalopram (CELEXA) 10 MG tablet Take 2 tablets (20 mg total) by mouth daily.  60 tablet  4  . cyclobenzaprine (FLEXERIL) 5 MG tablet Take 1 tablet (5 mg total) by mouth 3 (three) times daily as needed for muscle spasms.  30 tablet  0  . ergocalciferol (VITAMIN D2) 50000 UNITS capsule Take 1 capsule (50,000 Units total) by mouth once a week.  8 capsule  3  . fluticasone (FLONASE) 50 MCG/ACT nasal spray 2 sprays each nostril 1-2 times daily  16 g  3  . hydrochlorothiazide (HYDRODIURIL) 25 MG tablet Take 1 tablet (25 mg total) by mouth daily.  30 tablet   4  . meloxicam (MOBIC) 15 MG tablet Take 1 tablet (15 mg total) by mouth daily.  30 tablet  4  . mometasone-formoterol (DULERA) 200-5 MCG/ACT AERO Inhale 2 puffs into the lungs 2 (two) times daily.  1 Inhaler  3  . traMADol (ULTRAM) 50 MG tablet Take 1 tablet (50 mg total) by mouth every 6 (six) hours as needed. For pain.  30 tablet  3  . albuterol (PROVENTIL HFA;VENTOLIN HFA) 108 (90 BASE) MCG/ACT inhaler Inhale 1-2 puffs into the lungs every 6 (six) hours as needed for wheezing.  1 Inhaler  0   No current facility-administered medications on file prior to visit.   Family History  Problem Relation Age of Onset  . Diabetes Mother   . COPD Mother   . Heart disease Mother   . Diabetes Father   . Kidney disease Father   . Cancer Father   . Anesthesia problems Father   . Diabetes Sister   . Hypertension Sister   . Diabetes Brother   . Sleep apnea Brother   . Heart disease Sister   . Diabetes Sister   . Heart disease Brother    History   Social History  . Marital Status: Divorced  Spouse Name: N/A    Number of Children: N/A  . Years of Education: N/A   Occupational History  . Not on file.   Social History Main Topics  . Smoking status: Former Smoker    Quit date: 04/20/2011  . Smokeless tobacco: Never Used  . Alcohol Use: No  . Drug Use: No  . Sexual Activity: Not Currently    Birth Control/ Protection: Surgical   Other Topics Concern  . Not on file   Social History Narrative  . No narrative on file    Review of Systems  Constitutional: Negative for fever, chills, diaphoresis, activity change, appetite change and fatigue.  HENT: Negative for ear pain, nosebleeds, congestion, facial swelling, rhinorrhea, neck pain, neck stiffness and ear discharge.   Eyes: Negative for pain, discharge, redness, itching and visual disturbance.  Respiratory: Negative for cough, choking, chest tightness, shortness of breath, wheezing and stridor.   Cardiovascular: Negative for chest  pain, palpitations and leg swelling.  Gastrointestinal: Negative for abdominal distention.  Genitourinary: Negative for dysuria, urgency, frequency, hematuria, flank pain, decreased urine volume, difficulty urinating and dyspareunia.  Musculoskeletal: Negative for back pain, joint swelling, arthralgias and gait problem.  Neurological: Negative for dizziness, tremors, seizures, syncope, facial asymmetry, speech difficulty, weakness, light-headedness, numbness and headaches.  Hematological: Negative for adenopathy. Does not bruise/bleed easily.  Psychiatric/Behavioral: Negative for hallucinations, behavioral problems, confusion, dysphoric mood, decreased concentration and agitation.    Objective:   Filed Vitals:   12/15/13 1217  BP: 126/83  Pulse: 54  Temp: 98.1 F (36.7 C)  Resp: 14    Physical Exam  Constitutional: Appears well-developed and well-nourished. No distress.  HENT: Normocephalic. External right and left ear normal. Oropharynx is clear and moist.  Eyes: Conjunctivae and EOM are normal. PERRLA, no scleral icterus.  Neck: Normal ROM. Neck supple. No JVD. No tracheal deviation. No thyromegaly.  CVS: RRR, S1/S2 +, no murmurs, no gallops, no carotid bruit.  Pulmonary: Effort and breath sounds normal, no stridor, rhonchi, wheezes, rales.  Abdominal: Soft. BS +,  no distension, tenderness, rebound or guarding.  Musculoskeletal: Normal range of motion. No edema and no tenderness.  Lymphadenopathy: No lymphadenopathy noted, cervical, inguinal. Neuro: Alert. Normal reflexes, muscle tone coordination. No cranial nerve deficit. Skin: Skin is warm and dry. No rash noted. Not diaphoretic. No erythema. No pallor.  Psychiatric: Normal mood and affect. Behavior, judgment, thought content normal.   Lab Results  Component Value Date   WBC 27.0* 12/26/2012   HGB 13.0 12/26/2012   HCT 38.8 12/26/2012   MCV 90.2 12/26/2012   PLT 273 12/26/2012   Lab Results  Component Value Date    CREATININE 0.69 12/26/2012   BUN 20 12/26/2012   NA 133* 12/26/2012   K 4.4 12/26/2012   CL 99 12/26/2012   CO2 25 12/26/2012    Lab Results  Component Value Date   HGBA1C 5.5 12/15/2013   Lipid Panel     Component Value Date/Time   CHOL 156 09/11/2013 1053   TRIG 45 09/11/2013 1053   HDL 52 09/11/2013 1053   CHOLHDL 3.0 09/11/2013 1053   VLDL 9 09/11/2013 1053   LDLCALC 95 09/11/2013 1053       Assessment and plan:   Patient Active Problem List   Diagnosis Date Noted  . Back pain 07/22/2013  . COPD 08/24/2009  . HEADACHE 08/24/2009  . HYPERTENSION 05/12/2009  . ALLERGIC RHINITIS 05/12/2009  . ASTHMA 05/12/2009       Folliculitis Of her  labia Patient describes postcoital bleeding last year She will need a full pelvic exam Will prescribe doxycycline with 10 days and Neosporin ointment topically Gynecology referral provided for a full pelvic exam. The patient states that she status post hysterectomy     The patient was given clear instructions to go to ER or return to medical center if symptoms don't improve, worsen or new problems develop. The patient verbalized understanding. The patient was told to call to get any lab results if not heard anything in the next week.

## 2013-12-15 NOTE — Progress Notes (Signed)
Pt  Is here for a f/u. Complains of two knots on inner Rt thigh; raises up like a boil, comes and goes x2 months. Pain on level 3; comes and goes. Feels like a boil. Been taking Sudafed for sinuses; slight pain in face.

## 2013-12-22 ENCOUNTER — Ambulatory Visit: Payer: Medicare Other | Attending: Anesthesiology | Admitting: Physical Therapy

## 2013-12-22 DIAGNOSIS — IMO0001 Reserved for inherently not codable concepts without codable children: Secondary | ICD-10-CM | POA: Insufficient documentation

## 2013-12-22 DIAGNOSIS — J449 Chronic obstructive pulmonary disease, unspecified: Secondary | ICD-10-CM | POA: Diagnosis not present

## 2013-12-22 DIAGNOSIS — J4489 Other specified chronic obstructive pulmonary disease: Secondary | ICD-10-CM | POA: Insufficient documentation

## 2013-12-22 DIAGNOSIS — Z981 Arthrodesis status: Secondary | ICD-10-CM | POA: Diagnosis not present

## 2013-12-22 DIAGNOSIS — R262 Difficulty in walking, not elsewhere classified: Secondary | ICD-10-CM | POA: Diagnosis not present

## 2013-12-22 DIAGNOSIS — I10 Essential (primary) hypertension: Secondary | ICD-10-CM | POA: Diagnosis not present

## 2013-12-22 DIAGNOSIS — M545 Low back pain, unspecified: Secondary | ICD-10-CM | POA: Diagnosis not present

## 2013-12-22 DIAGNOSIS — Z9181 History of falling: Secondary | ICD-10-CM | POA: Diagnosis not present

## 2013-12-24 ENCOUNTER — Ambulatory Visit: Payer: Medicare Other | Admitting: Rehabilitation

## 2013-12-25 ENCOUNTER — Telehealth: Payer: Self-pay

## 2013-12-25 MED ORDER — PREDNISONE 20 MG PO TABS: ORAL_TABLET | ORAL | Status: DC

## 2013-12-25 NOTE — Telephone Encounter (Signed)
Patient called this am stating she had caught a cold And her asthma is acting up. Spoke with Dr Allyson Sabal Call in some prednisone 40 mg X5 days- Sent to [facility] on [address]

## 2013-12-28 ENCOUNTER — Ambulatory Visit: Payer: Medicare Other | Admitting: Physical Therapy

## 2013-12-30 ENCOUNTER — Encounter: Payer: Medicare Other | Admitting: Rehabilitation

## 2014-01-04 ENCOUNTER — Ambulatory Visit: Payer: Medicare Other | Admitting: Physical Therapy

## 2014-01-06 ENCOUNTER — Ambulatory Visit: Payer: Medicare Other | Admitting: Rehabilitation

## 2014-01-08 ENCOUNTER — Telehealth: Payer: Self-pay | Admitting: Internal Medicine

## 2014-01-08 NOTE — Telephone Encounter (Signed)
Pt. Has burning sensation when urinating. Patient would like to know if something can be prescribed over the phone. Patient has follow up appointment on 01/15/14. Please call patient.

## 2014-01-08 NOTE — Telephone Encounter (Signed)
Patient stated is having some burning when she urinates Explained she has not been seen since December Would need to be seen so we can run her urine

## 2014-01-12 ENCOUNTER — Ambulatory Visit: Payer: Medicare Other | Admitting: Physical Therapy

## 2014-01-14 ENCOUNTER — Ambulatory Visit: Payer: Medicare Other | Admitting: Physical Therapy

## 2014-01-15 ENCOUNTER — Ambulatory Visit: Payer: Medicare Other | Admitting: Internal Medicine

## 2014-01-20 ENCOUNTER — Ambulatory Visit (INDEPENDENT_AMBULATORY_CARE_PROVIDER_SITE_OTHER): Payer: Medicare Other | Admitting: Emergency Medicine

## 2014-01-20 ENCOUNTER — Telehealth: Payer: Self-pay | Admitting: Emergency Medicine

## 2014-01-20 ENCOUNTER — Encounter: Payer: Self-pay | Admitting: Emergency Medicine

## 2014-01-20 VITALS — BP 140/94 | HR 101 | Ht 63.0 in | Wt 168.4 lb

## 2014-01-20 DIAGNOSIS — J45909 Unspecified asthma, uncomplicated: Secondary | ICD-10-CM

## 2014-01-20 DIAGNOSIS — J449 Chronic obstructive pulmonary disease, unspecified: Secondary | ICD-10-CM | POA: Diagnosis not present

## 2014-01-20 MED ORDER — ALBUTEROL SULFATE (5 MG/ML) 0.5% IN NEBU: 2.5000 mg | INHALATION_SOLUTION | Freq: Every evening | RESPIRATORY_TRACT | Status: DC | PRN

## 2014-01-20 NOTE — Progress Notes (Signed)
53 yo former smoker with hx HTN, migraine HA, asthma dx in 2000 after a hospitalization for dyspnea. No asthma as a child. Had been managed on Advair 250 (ran out), now on Dulera + SABA since hosp. She was hospitalized 1/7 - 1/13 for an acute exacerbation.  She has been doing fairly well, is having some stabbing pains with exertion (prior cards eval was reassuring). She had gone back to smoking since last Summer, just quit again when hospitalized. She is weaning pred now. Uses albuterol every day at least once. She is planning to restart exercise routine. She is taking benadryl for itching. She is having a lot of nasal gtt and UA irritation.    Acute OV 05/28/13 -- she has been having more problems with congestion, more problems with wheeze, cough, dyspnea. She was on Dulera, nasonex - hasn't been able to take in months because she lost her job.   ROV 08/07/13 -- f/u COPD, rhinitis, cough. Was seen since last visit by Dr Melvyn Novas with wheeze, sputum, tightness. Was not taking her BD's - couldn;t afford. Was treated for sinusitis with augmentin and pred.  Also changed to bystolic. 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 Using dulera bid, albuterol 2-3 x a day.   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 She had stopped smoking for 2 months, has started back now about 1/2 pk a day. She is on [place] - feels that she benefited from the addition of spiriva. She was just treated with prednisone for an AE with good resolution > she believes it was house full of smoke that triggered her. Uses albuterol every day. Minimal cough or wheeze.   Filed Vitals:   01/20/14 1508  BP: 140/94  Pulse: 101  Height: 5\' 3"  (1.6 m)  Weight: 168 lb 6.4 oz (76.386 kg)  SpO2: 98%   Gen: Pleasant, well-nourished, in no distress,  normal affect  ENT: No lesions,  mouth clear,  oropharynx clear, very congested, hoarse voice  Neck: No JVD, no  TMG, no carotid bruits  Lungs: No use of accessory muscles,  clear without rales or rhonchi  Cardiovascular: RRR, heart sounds normal, no murmur or gallops, no peripheral edema  Musculoskeletal: No deformities, no cyanosis or clubbing  Neuro: alert, non focal  Skin: Warm, no lesions or rashes  COPD Benefited from addition of spiriva to dulera.  Discussed smoking cessation techniques today Albuterol prn rov 4

## 2014-01-20 NOTE — Assessment & Plan Note (Signed)
Benefited from addition of spiriva to dulera.  Discussed smoking cessation techniques today Albuterol prn rov 4

## 2014-01-20 NOTE — Patient Instructions (Signed)
Please continue to work on cutting down your cigarettes We will discuss setting a cigarette quit date at your next visit Continue your Spiriva and Dulera Use albuterol as needed. You may want to use it prior to exertion.  Follow with Dr Lamonte Sakai in 4 months or sooner if you have any problems.

## 2014-01-20 NOTE — Telephone Encounter (Signed)
Spoke with Rob at [facility] and confirmed rx for albuterol neb solution Nothing further is needed

## 2014-01-25 ENCOUNTER — Telehealth: Payer: Self-pay | Admitting: Emergency Medicine

## 2014-01-25 MED ORDER — PREDNISONE 10 MG PO TABS: ORAL_TABLET | ORAL | Status: DC

## 2014-01-25 NOTE — Telephone Encounter (Signed)
Pt aware rx for pred taper sent to [facility] Nothing further is needed

## 2014-02-23 ENCOUNTER — Encounter: Payer: Self-pay | Admitting: Internal Medicine

## 2014-02-23 ENCOUNTER — Other Ambulatory Visit (HOSPITAL_COMMUNITY)
Admission: RE | Admit: 2014-02-23 | Discharge: 2014-02-23 | Disposition: A | Discharge status: Home / Self Care | Payer: Medicare Other | Source: Ambulatory Visit | Attending: Internal Medicine | Admitting: Internal Medicine

## 2014-02-23 ENCOUNTER — Ambulatory Visit: Payer: Medicare Other | Attending: Internal Medicine | Admitting: Internal Medicine

## 2014-02-23 VITALS — BP 129/86 | HR 79 | Temp 98.7°F | Resp 16 | Ht 63.0 in | Wt 164.0 lb

## 2014-02-23 DIAGNOSIS — K219 Gastro-esophageal reflux disease without esophagitis: Secondary | ICD-10-CM | POA: Diagnosis not present

## 2014-02-23 DIAGNOSIS — Z0000 Encounter for general adult medical examination without abnormal findings: Secondary | ICD-10-CM | POA: Insufficient documentation

## 2014-02-23 DIAGNOSIS — Z7982 Long term (current) use of aspirin: Secondary | ICD-10-CM | POA: Insufficient documentation

## 2014-02-23 DIAGNOSIS — F3289 Other specified depressive episodes: Secondary | ICD-10-CM | POA: Diagnosis not present

## 2014-02-23 DIAGNOSIS — N760 Acute vaginitis: Secondary | ICD-10-CM | POA: Diagnosis not present

## 2014-02-23 DIAGNOSIS — Z124 Encounter for screening for malignant neoplasm of cervix: Secondary | ICD-10-CM | POA: Insufficient documentation

## 2014-02-23 DIAGNOSIS — Z79899 Other long term (current) drug therapy: Secondary | ICD-10-CM | POA: Diagnosis not present

## 2014-02-23 DIAGNOSIS — I739 Peripheral vascular disease, unspecified: Secondary | ICD-10-CM | POA: Diagnosis not present

## 2014-02-23 DIAGNOSIS — J441 Chronic obstructive pulmonary disease with (acute) exacerbation: Secondary | ICD-10-CM | POA: Insufficient documentation

## 2014-02-23 DIAGNOSIS — R7303 Prediabetes: Secondary | ICD-10-CM | POA: Insufficient documentation

## 2014-02-23 DIAGNOSIS — Z09 Encounter for follow-up examination after completed treatment for conditions other than malignant neoplasm: Secondary | ICD-10-CM | POA: Insufficient documentation

## 2014-02-23 DIAGNOSIS — J4489 Other specified chronic obstructive pulmonary disease: Secondary | ICD-10-CM | POA: Insufficient documentation

## 2014-02-23 DIAGNOSIS — Z1151 Encounter for screening for human papillomavirus (HPV): Secondary | ICD-10-CM | POA: Insufficient documentation

## 2014-02-23 DIAGNOSIS — Z1231 Encounter for screening mammogram for malignant neoplasm of breast: Secondary | ICD-10-CM | POA: Diagnosis not present

## 2014-02-23 DIAGNOSIS — J449 Chronic obstructive pulmonary disease, unspecified: Secondary | ICD-10-CM | POA: Diagnosis not present

## 2014-02-23 DIAGNOSIS — F329 Major depressive disorder, single episode, unspecified: Secondary | ICD-10-CM | POA: Diagnosis not present

## 2014-02-23 DIAGNOSIS — R7309 Other abnormal glucose: Secondary | ICD-10-CM | POA: Diagnosis not present

## 2014-02-23 DIAGNOSIS — Z113 Encounter for screening for infections with a predominantly sexual mode of transmission: Secondary | ICD-10-CM | POA: Insufficient documentation

## 2014-02-23 DIAGNOSIS — I10 Essential (primary) hypertension: Secondary | ICD-10-CM | POA: Insufficient documentation

## 2014-02-23 HISTORY — DX: Prediabetes: R73.03

## 2014-02-23 LAB — COMPLETE METABOLIC PANEL WITH GFR
ALT: 17 U/L (ref 0–35)
AST: 13 U/L (ref 0–37)
Albumin: 4.1 g/dL (ref 3.5–5.2)
Alkaline Phosphatase: 70 U/L (ref 39–117)
BUN: 13 mg/dL (ref 6–23)
CO2: 30 mEq/L (ref 19–32)
Calcium: 9.3 mg/dL (ref 8.4–10.5)
Chloride: 102 mEq/L (ref 96–112)
Creat: 0.71 mg/dL (ref 0.50–1.10)
GFR, Est African American: >89 mL/min
GFR, Est Non African American: >89 mL/min
Glucose, Bld: 76 mg/dL (ref 70–99)
Potassium: 3.9 mEq/L (ref 3.5–5.3)
Sodium: 141 mEq/L (ref 135–145)
Total Bilirubin: 0.3 mg/dL (ref 0.2–1.2)
Total Protein: 6.6 g/dL (ref 6.0–8.3)

## 2014-02-23 LAB — CBC WITH DIFFERENTIAL/PLATELET
Basophils Absolute: 0 10*3/uL (ref 0.0–0.1)
Basophils Relative: 0 % (ref 0–1)
Eosinophils Absolute: 0.3 10*3/uL (ref 0.0–0.7)
Eosinophils Relative: 3 % (ref 0–5)
HCT: 36.2 % (ref 36.0–46.0)
Hemoglobin: 12.2 g/dL (ref 12.0–15.0)
Lymphocytes Relative: 40 % (ref 12–46)
Lymphs Abs: 4.2 10*3/uL — ABNORMAL HIGH (ref 0.7–4.0)
MCH: 30.4 pg (ref 26.0–34.0)
MCHC: 33.7 g/dL (ref 30.0–36.0)
MCV: 90.3 fL (ref 78.0–100.0)
Monocytes Absolute: 0.6 10*3/uL (ref 0.1–1.0)
Monocytes Relative: 6 % (ref 3–12)
Neutro Abs: 5.3 10*3/uL (ref 1.7–7.7)
Neutrophils Relative %: 51 % (ref 43–77)
Platelets: 370 10*3/uL (ref 150–400)
RBC: 4.01 MIL/uL (ref 3.87–5.11)
RDW: 15 % (ref 11.5–15.5)
WBC: 10.4 10*3/uL (ref 4.0–10.5)

## 2014-02-23 MED ORDER — DOXYCYCLINE HYCLATE 100 MG PO TABS: 100.0000 mg | ORAL_TABLET | Freq: Two times a day (BID) | ORAL | Status: DC

## 2014-02-23 MED ORDER — OMEPRAZOLE 20 MG PO CPDR: 20.0000 mg | DELAYED_RELEASE_CAPSULE | Freq: Every day | ORAL | Status: DC

## 2014-02-23 MED ORDER — GUAIFENESIN-DM 100-10 MG/5ML PO SYRP: 5.0000 mL | ORAL_SOLUTION | ORAL | Status: DC | PRN

## 2014-02-23 NOTE — Progress Notes (Signed)
Pt is here to have a pap smear. Pt is requesting to be tested for HIV.

## 2014-02-23 NOTE — Progress Notes (Signed)
Patient ID: [name], female   DOB: 05/07/1961, 53 y.o.   MRN: [id number]   [name], is a 53 y.o. female  [id number]  [id number]  DOB - 09/18/1961  Chief Complaint  Patient presents with  . Follow-up        Subjective:   [name] is a 53 y.o. female here today for a follow up visit. Patient is known to have hypertension, asthma, COPD, peripheral vascular disease, GERD and depression. She is sexually active. She wants to have annual physical examination and be screened for sexually transmitted disease today and she also wants a Pap smear done. She had hysterectomy long time ago after her last childbirth. She has not had a Pap smear regularly attending, she does not know if she had total or partial hysterectomy. She claims compliant on all her medications. She denies any symptoms but she wants refill her medications today. She quit smoking since 2012, she denies the use of alcohol or illicit drugs. Patient has No headache, No chest pain, No abdominal pain - No Nausea, No new weakness tingling or numbness, No Cough - SOB.  Problem  Prediabetes  Encounter for Annual Physical Exam  Copd Exacerbation  Screening Mammogram for High-Risk Patient    ALLERGIES: Allergies  Allergen Reactions  . Sulfa Antibiotics Nausea And Vomiting  . Sulfonamide Derivatives Nausea And Vomiting    REACTION: n/v    PAST MEDICAL HISTORY: Past Medical History  Diagnosis Date  . Asthma   . Anxiety   . COPD (chronic obstructive pulmonary disease)   . Angina   . Shortness of breath   . Depression   . Hypertension   . GERD (gastroesophageal reflux disease)   . Headache(784.0)   . Arthritis   . Peripheral vascular disease   . Migraine     MEDICATIONS AT HOME: Prior to Admission medications   Medication Sig Start Date End Date Taking? Authorizing Provider  albuterol (PROVENTIL HFA;VENTOLIN HFA) 108 (90 BASE) MCG/ACT inhaler Inhale 1-2 puffs into the lungs  every 6 (six) hours as needed for wheezing. 08/10/12  Yes Illene Labrador, PA-C  albuterol (PROVENTIL) (5 MG/ML) 0.5% nebulizer solution Take 0.5 mLs (2.5 mg total) by nebulization at bedtime as needed. As needed for shortness of breath 01/20/14  Yes Collene Gobble, MD  aspirin 325 MG EC tablet Take 1 tablet (325 mg total) by mouth daily. 09/11/13  Yes Robbie Lis, MD  atenolol (TENORMIN) 50 MG tablet Take 1 tablet (50 mg total) by mouth daily. 09/11/13  Yes Robbie Lis, MD  bisoprolol (ZEBETA) 5 MG tablet Take 0.5 tablets (2.5 mg total) by mouth daily. 09/11/13  Yes Robbie Lis, MD  citalopram (CELEXA) 10 MG tablet Take 2 tablets (20 mg total) by mouth daily. 09/11/13  Yes Robbie Lis, MD  cyclobenzaprine (FLEXERIL) 5 MG tablet Take 1 tablet (5 mg total) by mouth 3 (three) times daily as needed for muscle spasms. 11/17/13  Yes Thurnell Lose, MD  ergocalciferol (VITAMIN D2) 50000 UNITS capsule Take 1 capsule (50,000 Units total) by mouth once a week. 09/11/13  Yes Robbie Lis, MD  fluticasone [facility]) 50 MCG/ACT nasal spray 2 sprays each nostril 1-2 times daily 09/11/13  Yes Robbie Lis, MD  hydrochlorothiazide (HYDRODIURIL) 25 MG tablet Take 1 tablet (25 mg total) by mouth daily. 09/11/13  Yes Robbie Lis, MD  meloxicam (MOBIC) 15 MG tablet Take 1 tablet (15 mg total) by mouth daily. 09/11/13  Yes Robbie Lis, MD  mometasone-formoterol [facility]) 200-5 MCG/ACT AERO Inhale 2 puffs into the lungs 2 (two) times daily. 09/11/13  Yes Robbie Lis, MD  tiotropium (SPIRIVA HANDIHALER) 18 MCG inhalation capsule Place 18 mcg into inhaler and inhale daily.   Yes Historical Provider, MD  traMADol (ULTRAM) 50 MG tablet Take 1 tablet (50 mg total) by mouth every 6 (six) hours as needed. For pain. 11/17/13  Yes Thurnell Lose, MD  doxycycline (VIBRA-TABS) 100 MG tablet Take 1 tablet (100 mg total) by mouth 2 (two) times daily. 02/23/14   Angelica Chessman, MD  guaiFENesin-dextromethorphan (ROBITUSSIN DM)  100-10 MG/5ML syrup Take 5 mLs by mouth every 4 (four) hours as needed for cough. 02/23/14   Angelica Chessman, MD  neomycin-bacitracin-polymyxin (NEOSPORIN) ointment Apply 1 application topically every 12 (twelve) hours. apply to eye 12/15/13   Reyne Dumas, MD  omeprazole (PRILOSEC) 20 MG capsule Take 1 capsule (20 mg total) by mouth daily. 02/23/14   Angelica Chessman, MD  predniSONE (DELTASONE) 10 MG tablet 40mg  x4 day, 30mg  x3 day, 20mg  x2 day, 10mg  x1 day the stop 01/25/14   Collene Gobble, MD     Objective:   Filed Vitals:   02/23/14 0956  BP: 129/86  Pulse: 79  Temp: 98.7 F (37.1 C)  TempSrc: Oral  Resp: 16  Height: 5\' 3"  (1.6 m)  Weight: 164 lb (74.39 kg)  SpO2: 98%    Exam General appearance : Awake, alert, not in any distress. Speech Clear. Not toxic looking HEENT: Atraumatic and Normocephalic, pupils equally reactive to light and accomodation Neck: supple, no JVD. No cervical lymphadenopathy.  Chest:Good air entry bilaterally, no added sounds  CVS: S1 S2 regular, no murmurs.  Abdomen: Bowel sounds present, Non tender and not distended with no gaurding, rigidity or rebound. Extremities: B/L Lower Ext shows no edema, both legs are warm to touch Neurology: Awake alert, and oriented X 3, CN II-XII intact, Non focal Skin:No Rash Wounds:N/A  Data Review Lab Results  Component Value Date   HGBA1C 5.5 12/15/2013     Assessment & Plan   1. Prediabetes Her hemoglobin A1c was 5.5% in December 2014 - CBC with Differential - COMPLETE METABOLIC PANEL WITH GFR - Urinalysis, Complete  2. Encounter for annual physical exam  - CBC with Differential - COMPLETE METABOLIC PANEL WITH GFR - Urinalysis, Complete - STD Panel (HBSAG,HIV,RPR) - Hepatitis C antibody, reflex - Cytology - PAP - Cervicovaginal ancillary only  3. COPD exacerbation  - guaiFENesin-dextromethorphan (ROBITUSSIN DM) 100-10 MG/5ML syrup; Take 5 mLs by mouth every 4 (four) hours as needed for cough.   Dispense: 118 mL; Refill: 0 - omeprazole (PRILOSEC) 20 MG capsule; Take 1 capsule (20 mg total) by mouth daily.  Dispense: 30 capsule; Refill: 3 - doxycycline (VIBRA-TABS) 100 MG tablet; Take 1 tablet (100 mg total) by mouth 2 (two) times daily.  Dispense: 20 tablet; Refill: 0  4. Screening mammogram for high-risk patient  - MM Digital Screening; Future  Patient was extensively counseled on nutrition and exercise   Return in about 6 months (around 08/26/2014), or if symptoms worsen or fail to improve, for Routine Follow Up, HTN/DM.  The patient was given clear instructions to go to ER or return to medical center if symptoms don't improve, worsen or new problems develop. The patient verbalized understanding. The patient was told to call to get lab results if they haven't heard anything in the next week.   This note has been created  with Surveyor, quantity. Any transcriptional errors are unintentional.    Angelica Chessman, MD, [place], [place], [place] and [facility] [place], [place]   02/23/2014, 11:09 AM

## 2014-02-24 LAB — CERVICOVAGINAL ANCILLARY ONLY
Chlamydia: NEGATIVE
Neisseria Gonorrhea: NEGATIVE
Wet Prep (BD Affirm): NEGATIVE
Wet Prep (BD Affirm): NEGATIVE
Wet Prep (BD Affirm): POSITIVE — AB

## 2014-02-24 LAB — URINALYSIS, COMPLETE
Bacteria, UA: NONE SEEN
Bilirubin Urine: NEGATIVE
Casts: NONE SEEN
Crystals: NONE SEEN
Glucose, UA: NEGATIVE mg/dL
Hgb urine dipstick: NEGATIVE
Ketones, ur: NEGATIVE mg/dL
Leukocytes, UA: NEGATIVE
Nitrite: NEGATIVE
Protein, ur: NEGATIVE mg/dL
Specific Gravity, Urine: 1.016 (ref 1.005–1.030)
Squamous Epithelial / LPF: NONE SEEN
Urobilinogen, UA: 0.2 mg/dL (ref 0.0–1.0)
pH: 5.5 (ref 5.0–8.0)

## 2014-02-24 LAB — HEPATITIS C ANTIBODY, REFLEX: HCV Ab: NEGATIVE

## 2014-02-24 LAB — STD PANEL
HIV: NONREACTIVE
Hepatitis B Surface Ag: NEGATIVE

## 2014-03-01 ENCOUNTER — Telehealth: Payer: Self-pay | Admitting: *Deleted

## 2014-03-01 DIAGNOSIS — N760 Acute vaginitis: Secondary | ICD-10-CM

## 2014-03-01 MED ORDER — METRONIDAZOLE 500 MG PO TABS: 500.0000 mg | ORAL_TABLET | Freq: Two times a day (BID) | ORAL | Status: DC

## 2014-03-01 NOTE — Telephone Encounter (Signed)
Pt is aware of her lab and Pap results. Pt will pick up the antibiotic today.

## 2014-03-01 NOTE — Telephone Encounter (Signed)
Message copied by Joan Mayans on Mon Mar 01, 2014  3:40 PM ------      Message from: Tresa Garter      Created: Sun Feb 28, 2014  8:57 PM       Please call to inform patient that her laboratory tests results are within normal limit. Her HIV, syphilis and hepatitis panel all negative. She has infection with bacteria vaginosis from the Pap smear, this is not a sexually transmitted disease and treatable with antibiotics.      Please call in metronidazole tablets 500 mg by mouth 2 times a day for 7 days ------

## 2014-03-16 ENCOUNTER — Other Ambulatory Visit: Payer: Self-pay | Admitting: Internal Medicine

## 2014-03-16 DIAGNOSIS — Z1231 Encounter for screening mammogram for malignant neoplasm of breast: Secondary | ICD-10-CM

## 2014-03-30 ENCOUNTER — Telehealth: Payer: Self-pay | Admitting: Emergency Medicine

## 2014-03-30 MED ORDER — FLUTICASONE PROPIONATE 50 MCG/ACT NA SUSP: NASAL | Status: DC

## 2014-03-30 NOTE — Telephone Encounter (Signed)
Advised pt of RB's rec's, sent flonase into pharm. Pt aware of rec's and rx at pharm. Nothing further is needed

## 2014-03-30 NOTE — Telephone Encounter (Signed)
Please have her restart fluticasone 2 sprays each nostril bid. I think this will improve her control. If she is no better on this regimen then she needs to call us

## 2014-03-30 NOTE — Telephone Encounter (Signed)
C/O:  Sinus pressure, PND, chest congestion with SOB, cough with yellow mucus.  Has tried otc allergy meds with no relief, needs nasal spray sent to pharm is out of this.  Also, requesting further rec's and rx for symptoms sent to pharm.    RB please advise, thanks

## 2014-03-31 ENCOUNTER — Telehealth: Payer: Self-pay | Admitting: Emergency Medicine

## 2014-03-31 ENCOUNTER — Telehealth: Payer: Self-pay | Admitting: Internal Medicine

## 2014-03-31 ENCOUNTER — Ambulatory Visit (HOSPITAL_COMMUNITY)
Admission: RE | Admit: 2014-03-31 | Discharge: 2014-03-31 | Disposition: A | Discharge status: Home / Self Care | Payer: Medicare Other | Source: Ambulatory Visit | Attending: Internal Medicine | Admitting: Internal Medicine

## 2014-03-31 DIAGNOSIS — Z1231 Encounter for screening mammogram for malignant neoplasm of breast: Secondary | ICD-10-CM

## 2014-03-31 LAB — HM MAMMOGRAPHY

## 2014-03-31 NOTE — Telephone Encounter (Signed)
Pt called in with c/o allergy sx,URI that started last week. Pt states Mucinex not working  Technical brewer called in script for [facility]. Pt states medication is working Instructed pt to call clinic if no improvement in 2 dys for nurse visit

## 2014-03-31 NOTE — Telephone Encounter (Signed)
Pt called concern about a bad cough that she is having, pt states that she is having chest congestion and green mucus. Please contact pt

## 2014-04-01 ENCOUNTER — Telehealth: Payer: Self-pay | Admitting: Emergency Medicine

## 2014-04-01 MED ORDER — PROMETHAZINE-CODEINE 6.25-10 MG/5ML PO SYRP: 5.0000 mL | ORAL_SOLUTION | Freq: Four times a day (QID) | ORAL | Status: DC | PRN

## 2014-04-01 MED ORDER — AZITHROMYCIN 250 MG PO TABS: ORAL_TABLET | ORAL | Status: DC

## 2014-04-01 NOTE — Telephone Encounter (Signed)
Spoke with pt. Made her aware I will call the pharmacy and get this called in. i apologized to pt this was not done this AM.  I called [facility] and gave VO to [place] a pharmacists. Nothing further needed.

## 2014-04-01 NOTE — Telephone Encounter (Signed)
When reviewing schedule RB is on vacation and sending to doc of the day for rec's.

## 2014-04-01 NOTE — Telephone Encounter (Signed)
Per [facility] doc of the day ok to call in phenergan cough syrup and zpak. Confirmed pharm with patient and advised what was sent in. Nothing further is needed

## 2014-04-01 NOTE — Telephone Encounter (Signed)
Spoke with patient and she's complaining of sinus infection. Pressure in sinuses and teeth aching, headaches and cough with mucus yellow in color. Pt requesting zpak and cough syrup sent to pharm.  RB you sent her r/f on flonase yesterday and she states this is not helping requesting the above medications.  Can call in phenergan can be called in. Please advise, thanks

## 2014-04-05 ENCOUNTER — Telehealth: Payer: Self-pay | Admitting: *Deleted

## 2014-04-05 NOTE — Telephone Encounter (Signed)
Message copied by Joan Mayans on Mon Apr 05, 2014  2:24 PM ------      Message from: Angelica Chessman E      Created: Mon Apr 05, 2014  2:04 PM       Please inform patient had mammogram showed no evidence of malignancy ------

## 2014-04-05 NOTE — Telephone Encounter (Signed)
Left message

## 2014-04-13 ENCOUNTER — Other Ambulatory Visit: Payer: Self-pay | Admitting: *Deleted

## 2014-04-22 ENCOUNTER — Ambulatory Visit (INDEPENDENT_AMBULATORY_CARE_PROVIDER_SITE_OTHER): Payer: Medicare Other | Admitting: Internal Medicine

## 2014-04-22 ENCOUNTER — Encounter: Payer: Self-pay | Admitting: Internal Medicine

## 2014-04-22 VITALS — BP 132/94 | HR 78 | Temp 97.9°F | Ht 63.0 in | Wt 163.5 lb

## 2014-04-22 DIAGNOSIS — I10 Essential (primary) hypertension: Secondary | ICD-10-CM

## 2014-04-22 DIAGNOSIS — R296 Repeated falls: Secondary | ICD-10-CM

## 2014-04-22 DIAGNOSIS — Z23 Encounter for immunization: Secondary | ICD-10-CM

## 2014-04-22 DIAGNOSIS — Z0000 Encounter for general adult medical examination without abnormal findings: Secondary | ICD-10-CM | POA: Insufficient documentation

## 2014-04-22 DIAGNOSIS — Z9181 History of falling: Secondary | ICD-10-CM

## 2014-04-22 DIAGNOSIS — F419 Anxiety disorder, unspecified: Secondary | ICD-10-CM | POA: Insufficient documentation

## 2014-04-22 DIAGNOSIS — F411 Generalized anxiety disorder: Secondary | ICD-10-CM

## 2014-04-22 DIAGNOSIS — K219 Gastro-esophageal reflux disease without esophagitis: Secondary | ICD-10-CM | POA: Insufficient documentation

## 2014-04-22 DIAGNOSIS — Z0001 Encounter for general adult medical examination with abnormal findings: Secondary | ICD-10-CM | POA: Insufficient documentation

## 2014-04-22 DIAGNOSIS — L0293 Carbuncle, unspecified: Secondary | ICD-10-CM | POA: Insufficient documentation

## 2014-04-22 DIAGNOSIS — F329 Major depressive disorder, single episode, unspecified: Secondary | ICD-10-CM | POA: Insufficient documentation

## 2014-04-22 DIAGNOSIS — I739 Peripheral vascular disease, unspecified: Secondary | ICD-10-CM | POA: Insufficient documentation

## 2014-04-22 DIAGNOSIS — F32A Depression, unspecified: Secondary | ICD-10-CM | POA: Insufficient documentation

## 2014-04-22 MED ORDER — FLUOXETINE HCL 20 MG PO CAPS: 20.0000 mg | ORAL_CAPSULE | Freq: Every day | ORAL | Status: DC

## 2014-04-22 NOTE — Assessment & Plan Note (Signed)
Ok for re-start prozac 20 qd, to cont counseling as she has planned

## 2014-04-22 NOTE — Patient Instructions (Addendum)
You had the tetanus shot today (Td)   OK to stop the citalopram  Please take all new medication as prescribed - the prozac 20 mg per day  Please continue all other medications as before, and refills have been done if requested. Please have the pharmacy call with any other refills you may need.  Please continue your efforts at being more active, low cholesterol diet, and weight control.  You are otherwise up to date with prevention measures today.  I think we can hold on further lab testing today  Please keep your appointments with your specialists as you have planned - Dr Lamonte Sakai  Please stop smoking  Please return in 6 months, or sooner if needed

## 2014-04-22 NOTE — Assessment & Plan Note (Signed)
Exam today and recent labs negative, etiology unclear, to continue to follow

## 2014-04-22 NOTE — Progress Notes (Signed)
Pre visit review using our clinic review tool, if applicable. No additional management support is needed unless otherwise documented below in the visit note. 

## 2014-04-22 NOTE — Progress Notes (Signed)
Subjective:    Patient ID: [name], female    DOB: Mar 26, 1961, 53 y.o.   MRN: [id number]  HPI  Here for yearly exam and establish;  Overall doing ok;  Pt denies CP, worsening SOB, DOE, wheezing, orthopnea, PND, worsening LE edema, palpitations, dizziness or syncope.  Pt denies neurological change such as new headache, facial weakness.  Pt denies polydipsia, polyuria, or low sugar symptoms. Pt states overall good compliance with treatment and medications, good tolerability, and has been trying to follow lower cholesterol diet.  Pt denies worsening depressive symptoms, suicidal ideation or panic. No fever, night sweats, wt loss, loss of appetite, or other constitutional symptoms.  Pt states good ability with ADL's, home safety reviewed and adequate, no other significant changes in hearing or vision, and only occasionally active with exercise. Smokes occas marijuana for pain relief.  Has Hx of recurrent falls x 3 in past yr (no other falls prior)  in different places like getting out of tub, at church, and at store parking lot. Legs just gave out, fell down. No back pain ,  Recent labs negative. Mar 2015 as on chart. Re-started smoking Past Medical History  Diagnosis Date  . Asthma   . Anxiety   . COPD (chronic obstructive pulmonary disease)   . Angina   . Depression   . Hypertension   . GERD (gastroesophageal reflux disease)   . Arthritis   . Peripheral vascular disease   . Migraine   . COPD 08/24/2009    Qualifier: Diagnosis of  By: Burnett Kanaris    . Prediabetes 02/23/2014  . HYPERTENSION 05/12/2009    Qualifier: Diagnosis of  By: Lenn Cal Deborra Medina), Wynona Canes    . 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     . ALLERGIC RHINITIS 05/12/2009    Qualifier: Diagnosis of  By: Thornton Papas), Wynona Canes     Past Surgical  History  Procedure Laterality Date  . Neck surgery    . Toe surgery    . Abdominal hysterectomy    . Tubal ligation    . Kidney stone surgery    . Hemorrhoid surgery    . Incise and drain abcess    . Back surgery    . Diagnostic laparoscopy    . Colonoscopy  12/20/2011    Procedure: COLONOSCOPY;  Surgeon: Landry Dyke, MD;  Location: [facility];  Service: Endoscopy;  Laterality: N/A;  . Colonoscopy  03/05/2012    Procedure: COLONOSCOPY;  Surgeon: Landry Dyke, MD;  Location: [facility];  Service: Endoscopy;  Laterality: N/A;    reports that she quit smoking about 3 years ago. She has never used smokeless tobacco. She reports that she does not drink alcohol or use illicit drugs. family history includes Anesthesia problems in her father; COPD in her mother; Cancer in her father; Diabetes in her brother, father, mother, sister, and sister; Heart disease in her brother, mother, and sister; Hypertension in her sister; Kidney disease in her father; Sleep apnea in her brother. Allergies  Allergen Reactions  . Sulfa Antibiotics Nausea And Vomiting  . Sulfonamide Derivatives Nausea And Vomiting    REACTION: n/v   Current Outpatient Prescriptions on File Prior to Visit  Medication Sig Dispense Refill  . albuterol (PROVENTIL HFA;VENTOLIN HFA) 108 (90 BASE) MCG/ACT inhaler Inhale 1-2 puffs into the lungs every  6 (six) hours as needed for wheezing.  1 Inhaler  0  . albuterol (PROVENTIL) (5 MG/ML) 0.5% nebulizer solution Take 0.5 mLs (2.5 mg total) by nebulization at bedtime as needed. As needed for shortness of breath  20 mL  3  . aspirin 325 MG EC tablet Take 1 tablet (325 mg total) by mouth daily.  30 tablet  3  . atenolol (TENORMIN) 50 MG tablet Take 1 tablet (50 mg total) by mouth daily.  30 tablet  4  . bisoprolol (ZEBETA) 5 MG tablet Take 0.5 tablets (2.5 mg total) by mouth daily.  30 tablet  4  . citalopram (CELEXA) 10 MG tablet Take 2 tablets (20 mg total) by mouth daily.  60  tablet  4  . cyclobenzaprine (FLEXERIL) 5 MG tablet Take 1 tablet (5 mg total) by mouth 3 (three) times daily as needed for muscle spasms.  30 tablet  0  . ergocalciferol (VITAMIN D2) 50000 UNITS capsule Take 1 capsule (50,000 Units total) by mouth once a week.  8 capsule  3  . fluticasone (FLONASE) 50 MCG/ACT nasal spray 2 sprays each nostril 1-2 times daily  16 g  3  . hydrochlorothiazide (HYDRODIURIL) 25 MG tablet Take 1 tablet (25 mg total) by mouth daily.  30 tablet  4  . meloxicam (MOBIC) 15 MG tablet Take 1 tablet (15 mg total) by mouth daily.  30 tablet  4  . mometasone-formoterol (DULERA) 200-5 MCG/ACT AERO Inhale 2 puffs into the lungs 2 (two) times daily.  1 Inhaler  3  . neomycin-bacitracin-polymyxin (NEOSPORIN) ointment Apply 1 application topically every 12 (twelve) hours. apply to eye  15 g  0  . omeprazole (PRILOSEC) 20 MG capsule Take 1 capsule (20 mg total) by mouth daily.  30 capsule  3  . tiotropium (SPIRIVA HANDIHALER) 18 MCG inhalation capsule Place 18 mcg into inhaler and inhale daily.      . traMADol (ULTRAM) 50 MG tablet Take 1 tablet (50 mg total) by mouth every 6 (six) hours as needed. For pain.  30 tablet  3   No current facility-administered medications on file prior to visit.    Review of Systems Constitutional: Negative for increased diaphoresis, other activity, appetite or other siginficant weight change  HENT: Negative for worsening hearing loss, ear pain, facial swelling, mouth sores and neck stiffness.   Eyes: Negative for other worsening pain, redness or visual disturbance.  Respiratory: Negative for shortness of breath and wheezing.   Cardiovascular: Negative for chest pain and palpitations.  Gastrointestinal: Negative for diarrhea, blood in stool, abdominal distention or other pain Genitourinary: Negative for hematuria, flank pain or change in urine volume.  Musculoskeletal: Negative for myalgias or other joint complaints.  Skin: Negative for color change  and wound.  Neurological: Negative for syncope and numbness. other than noted Hematological: Negative for adenopathy. or other swelling Psychiatric/Behavioral: Negative for hallucinations, self-injury, decreased concentration or other worsening agitation.      Objective:   Physical Exam BP 132/94  Pulse 78  Temp(Src) 97.9 F (36.6 C) (Oral)  Ht 5\' 3"  (1.6 m)  Wt 163 lb 8 oz (74.163 kg)  BMI 28.97 kg/m2  SpO2 95% VS noted,  Constitutional: Pt is oriented to person, place, and time. Appears well-developed and well-nourished.  Head: Normocephalic and atraumatic.  Right Ear: External ear normal.  Left Ear: External ear normal.  Nose: Nose normal.  Mouth/Throat: Oropharynx is clear and moist.  Eyes: Conjunctivae and EOM are normal. Pupils are equal,  round, and reactive to light.  Neck: Normal range of motion. Neck supple. No JVD present. No tracheal deviation present.  Cardiovascular: Normal rate, regular rhythm, normal heart sounds and intact distal pulses.   Pulmonary/Chest: Effort normal and breath sounds without rales or wheezing  Abdominal: Soft. Bowel sounds are normal. NT. No HSM  Musculoskeletal: Normal range of motion. Exhibits no edema.  Lymphadenopathy:  Has no cervical adenopathy.  Neurological: Pt is alert and oriented to person, place, and time. Pt has normal reflexes. No cranial nerve deficit. Motor grossly intact Skin: Skin is warm and dry. No rash noted.  Psychiatric:  Has 1-2+ anxiuos mood and affect. Behavior is normal.     Assessment & Plan:

## 2014-04-22 NOTE — Addendum Note (Signed)
Addended by: Biagio Borg on: 04/22/2014 10:24 AM   Modules accepted: Orders, Medications

## 2014-04-22 NOTE — Assessment & Plan Note (Signed)
stable overall by history and exam, recent data reviewed with pt, and pt to continue medical treatment as before,  to f/u any worsening symptoms or concerns BP Readings from Last 3 Encounters:  04/22/14 132/94  02/23/14 129/86  01/20/14 140/94

## 2014-04-22 NOTE — Addendum Note (Signed)
Addended by: Sharon Seller B on: 04/22/2014 10:25 AM   Modules accepted: Orders

## 2014-04-23 ENCOUNTER — Telehealth: Payer: Self-pay | Admitting: Internal Medicine

## 2014-04-23 MED ORDER — ERGOCALCIFEROL 1.25 MG (50000 UT) PO CAPS: 50000.0000 [IU] | ORAL_CAPSULE | ORAL | Status: DC

## 2014-04-23 NOTE — Telephone Encounter (Signed)
Relevant patient education assigned to patient using Emmi. ° °

## 2014-05-14 ENCOUNTER — Ambulatory Visit (INDEPENDENT_AMBULATORY_CARE_PROVIDER_SITE_OTHER): Payer: Medicare Other | Admitting: Internal Medicine

## 2014-05-14 ENCOUNTER — Ambulatory Visit: Payer: Medicare Other | Admitting: Internal Medicine

## 2014-05-14 ENCOUNTER — Encounter: Payer: Self-pay | Admitting: Internal Medicine

## 2014-05-14 VITALS — BP 140/100 | HR 94 | Temp 99.0°F | Wt 160.4 lb

## 2014-05-14 DIAGNOSIS — J069 Acute upper respiratory infection, unspecified: Secondary | ICD-10-CM | POA: Diagnosis not present

## 2014-05-14 DIAGNOSIS — J309 Allergic rhinitis, unspecified: Secondary | ICD-10-CM | POA: Diagnosis not present

## 2014-05-14 DIAGNOSIS — J449 Chronic obstructive pulmonary disease, unspecified: Secondary | ICD-10-CM

## 2014-05-14 DIAGNOSIS — M797 Fibromyalgia: Secondary | ICD-10-CM

## 2014-05-14 DIAGNOSIS — I10 Essential (primary) hypertension: Secondary | ICD-10-CM | POA: Diagnosis not present

## 2014-05-14 HISTORY — DX: Fibromyalgia: M79.7

## 2014-05-14 MED ORDER — FLUTICASONE PROPIONATE 50 MCG/ACT NA SUSP: NASAL | Status: DC

## 2014-05-14 MED ORDER — LEVOFLOXACIN 250 MG PO TABS: 250.0000 mg | ORAL_TABLET | Freq: Every day | ORAL | Status: DC

## 2014-05-14 NOTE — Progress Notes (Signed)
Subjective:    Patient ID: [name], female    DOB: 1961-06-15, 53 y.o.   MRN: [id number]  HPI   Here with 2-3 days acute onset fever, facial pain, pressure, headache, general weakness and malaise, and greenish d/c, with mild ST and cough, but pt denies chest pain, wheezing, increased sob or doe, orthopnea, PND, increased LE swelling, palpitations, dizziness or syncope.  Has diffuse myalgias, ?  in addition to her FMS.  Pt denies new neurological symptoms such as new headache, or facial or extremity weakness or numbness   Pt denies polydipsia, polyuria.  Not taking Vit D or the flonase.,  Does have several wks ongoing nasal allergy symptoms with clearish congestion, itch and sneezing, without fever, pain, ST, cough, swelling or wheezing. Needs a  Disability form filled out Past Medical History  Diagnosis Date  . Asthma   . Anxiety   . COPD (chronic obstructive pulmonary disease)   . Angina   . Depression   . Hypertension   . GERD (gastroesophageal reflux disease)   . Arthritis   . Peripheral vascular disease   . Migraine   . COPD 08/24/2009    Qualifier: Diagnosis of  By: Burnett Kanaris    . Prediabetes 02/23/2014  . HYPERTENSION 05/12/2009    Qualifier: Diagnosis of  By: Lenn Cal Deborra Medina), Wynona Canes    . 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     . ALLERGIC RHINITIS 05/12/2009    Qualifier: Diagnosis of  By: Lenn Cal Deborra Medina), Wynona Canes    . Fibromyalgia 05/14/2014   Past Surgical History  Procedure Laterality Date  . Neck surgery    . Toe surgery    . Abdominal hysterectomy    . Tubal ligation    . Kidney stone surgery    . Hemorrhoid surgery    . Incise and drain abcess    . Back surgery    . Diagnostic laparoscopy    . Colonoscopy  12/20/2011    Procedure: COLONOSCOPY;  Surgeon: Landry Dyke, MD;   Location: [facility];  Service: Endoscopy;  Laterality: N/A;  . Colonoscopy  03/05/2012    Procedure: COLONOSCOPY;  Surgeon: Landry Dyke, MD;  Location: [facility];  Service: Endoscopy;  Laterality: N/A;    reports that she quit smoking about 3 years ago. She has never used smokeless tobacco. She reports that she does not drink alcohol or use illicit drugs. family history includes Anesthesia problems in her father; COPD in her mother; Cancer in her father; Diabetes in her brother, father, mother, sister, and sister; Heart disease in her brother, mother, and sister; Hypertension in her sister; Kidney disease in her father; Sleep apnea in her brother. Allergies  Allergen Reactions  . Sulfa Antibiotics Nausea And Vomiting  . Sulfonamide Derivatives Nausea And Vomiting    REACTION: n/v   Current Outpatient Prescriptions on File Prior to Visit  Medication Sig Dispense Refill  . albuterol (PROVENTIL HFA;VENTOLIN HFA) 108 (90 BASE) MCG/ACT inhaler Inhale 1-2 puffs into the lungs every 6 (six) hours as needed for wheezing.  1 Inhaler  0  . albuterol (PROVENTIL) (5 MG/ML) 0.5% nebulizer solution Take 0.5 mLs (2.5 mg total) by nebulization at bedtime as needed. As needed for shortness of breath  20 mL  3  . aspirin 325 MG EC tablet Take 1 tablet (325  mg total) by mouth daily.  30 tablet  3  . atenolol (TENORMIN) 50 MG tablet Take 1 tablet (50 mg total) by mouth daily.  30 tablet  4  . cyclobenzaprine (FLEXERIL) 5 MG tablet Take 1 tablet (5 mg total) by mouth 3 (three) times daily as needed for muscle spasms.  30 tablet  0  . ergocalciferol (VITAMIN D2) 50000 UNITS capsule Take 1 capsule (50,000 Units total) by mouth once a week.  8 capsule  3  . FLUoxetine (PROZAC) 20 MG capsule Take 1 capsule (20 mg total) by mouth daily.  90 capsule  3  . hydrochlorothiazide (HYDRODIURIL) 25 MG tablet Take 1 tablet (25 mg total) by mouth daily.  30 tablet  4  . meloxicam (MOBIC) 15 MG tablet Take 1 tablet (15  mg total) by mouth daily.  30 tablet  4  . mometasone-formoterol (DULERA) 200-5 MCG/ACT AERO Inhale 2 puffs into the lungs 2 (two) times daily.  1 Inhaler  3  . neomycin-bacitracin-polymyxin (NEOSPORIN) ointment Apply 1 application topically every 12 (twelve) hours. apply to eye  15 g  0  . omeprazole (PRILOSEC) 20 MG capsule Take 1 capsule (20 mg total) by mouth daily.  30 capsule  3  . tiotropium (SPIRIVA HANDIHALER) 18 MCG inhalation capsule Place 18 mcg into inhaler and inhale daily.      . traMADol (ULTRAM) 50 MG tablet Take 1 tablet (50 mg total) by mouth every 6 (six) hours as needed. For pain.  30 tablet  3   No current facility-administered medications on file prior to visit.       Review of Systems  Constitutional: Negative for unusual diaphoresis or other sweats  HENT: Negative for ringing in ear Eyes: Negative for double vision or worsening visual disturbance.  Respiratory: Negative for choking and stridor.   Gastrointestinal: Negative for vomiting or other signifcant bowel change Genitourinary: Negative for hematuria or decreased urine volume.  Musculoskeletal: Negative for other MSK pain or swelling Skin: Negative for color change and worsening wound.  Neurological: Negative for tremors and numbness other than noted  Psychiatric/Behavioral: Negative for decreased concentration or agitation other than above       Objective:   Physical Exam BP 140/100  Pulse 94  Temp(Src) 99 F (37.2 C) (Oral)  Wt 160 lb 6 oz (72.746 kg)  SpO2 97% VS noted,  Constitutional: Pt appears well-developed, well-nourished.  HENT: Head: NCAT.  Right Ear: External ear normal.  Left Ear: External ear normal.  Bilat tm's with mod  Erythema left >> right.  Max sinus areas mild tender.  Pharynx with mild erythema, no exudate Eyes: . Pupils are equal, round, and reactive to light. Conjunctivae and EOM are normal Neck: Normal range of motion. Neck supple.  Cardiovascular: Normal rate and regular  rhythm.   Pulmonary/Chest: Effort normal and breath sounds decreased.  Abd:  Soft, NT, ND, + BS Neurological: Pt is alert. Not confused , motor grossly intact Skin: Skin is warm. No rash Psychiatric: Pt behavior is normal. No agitation. 1+ nervous    Assessment & Plan:

## 2014-05-14 NOTE — Assessment & Plan Note (Signed)
Mild elev, likely situational, o/w stable overall by history and exam, recent data reviewed with pt, and pt to continue medical treatment as before,  to f/u any worsening symptoms or concerns BP Readings from Last 3 Encounters:  05/14/14 140/100  04/22/14 132/94  02/23/14 129/86

## 2014-05-14 NOTE — Assessment & Plan Note (Signed)
Mild to mod, for flonase asd,  to f/u any worsening symptoms or concerns  

## 2014-05-14 NOTE — Assessment & Plan Note (Signed)
stable overall by history and exam, recent data reviewed with pt, and pt to continue medical treatment as before,  to f/u any worsening symptoms or concerns SpO2 Readings from Last 3 Encounters:  05/14/14 97%  04/22/14 95%  02/23/14 98%

## 2014-05-14 NOTE — Assessment & Plan Note (Signed)
Mild to mod, for antibx course,  to f/u any worsening symptoms or concerns 

## 2014-05-14 NOTE — Patient Instructions (Addendum)
Please take all new medication as prescribed - the antibiotic  You can also take Delsym OTC for cough, and/or Mucinex (or it's generic off brand) for congestion, and tylenol as needed for pain, and chloraseptic for throat pain as well  Please also take Vit D 1000 units per day  Please continue all other medications as before, and refills have been done if requested - the flonase  Please have the pharmacy call with any other refills you may need.  Please return in 3 months, or sooner if needed  You should be called when the disability form is done

## 2014-05-14 NOTE — Progress Notes (Signed)
Pre visit review using our clinic review tool, if applicable. No additional management support is needed unless otherwise documented below in the visit note. 

## 2014-05-24 ENCOUNTER — Encounter: Payer: Self-pay | Admitting: Emergency Medicine

## 2014-05-24 ENCOUNTER — Ambulatory Visit (INDEPENDENT_AMBULATORY_CARE_PROVIDER_SITE_OTHER): Payer: Medicare Other | Admitting: Emergency Medicine

## 2014-05-24 VITALS — BP 130/82 | HR 61 | Ht 65.0 in | Wt 161.0 lb

## 2014-05-24 DIAGNOSIS — J449 Chronic obstructive pulmonary disease, unspecified: Secondary | ICD-10-CM

## 2014-05-24 NOTE — Assessment & Plan Note (Signed)
Stable at this time.  - continue same regimen, - congratulated her smoking cessation.  - rov 4

## 2014-05-24 NOTE — Patient Instructions (Signed)
Please continue your inhaled medications as you are taking them  Continue your flonase Do NOT restart smoking! Congratulations on stopping  Follow with Dr Lamonte Sakai in 4 months or sooner if you have any problems.

## 2014-05-24 NOTE — Progress Notes (Signed)
53 yo former smoker with hx HTN, migraine HA, asthma dx in 2000 after a hospitalization for dyspnea. No asthma as a child. Had been managed on Advair 250 (ran out), now on Dulera + SABA since hosp. She was hospitalized 1/7 - 1/13 for an acute exacerbation.  She has been doing fairly well, is having some stabbing pains with exertion (prior cards eval was reassuring). She had gone back to smoking since last Summer, just quit again when hospitalized. She is weaning pred now. Uses albuterol every day at least once. She is planning to restart exercise routine. She is taking benadryl for itching. She is having a lot of nasal gtt and UA irritation.    Acute OV 05/28/13 -- she has been having more problems with congestion, more problems with wheeze, cough, dyspnea. She was on Dulera, nasonex - hasn't been able to take in months because she lost her job.   ROV 08/07/13 -- f/u COPD, rhinitis, cough. Was seen since last visit by Dr Melvyn Novas with wheeze, sputum, tightness. Was not taking her BD's - couldn;t afford. Was treated for sinusitis with augmentin and pred.  Also changed to bystolic. 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 Using dulera bid, albuterol 2-3 x a day.   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 She had stopped smoking for 2 months, has started back now about 1/2 pk a day. She is on [place] - feels that she benefited from the addition of spiriva. She was just treated with prednisone for an AE with good resolution > she believes it was house full of smoke that triggered her. Uses albuterol every day. Minimal cough or wheeze.   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 She has been doing fairly well, although she just moved to an apartment with 3 flights of stairs. She quit smoking June 2. She is having some insomnia and has been eating a lot. Her cough has been stable. She was just treated for  otitis media. She restarted flonase.   Filed Vitals:   05/24/14 1450  BP: 130/82  Pulse: 61  Height: 5\' 5"  (1.651 m)  Weight: 161 lb (73.029 kg)  SpO2: 93%   Gen: Pleasant, well-nourished, in no distress,  normal affect  ENT: No lesions,  mouth clear,  oropharynx clear, very congested, hoarse voice  Neck: No JVD, no TMG, no carotid bruits  Lungs: No use of accessory muscles,  clear without rales or rhonchi  Cardiovascular: RRR, heart sounds normal, no murmur or gallops, no peripheral edema  Musculoskeletal: No deformities, no cyanosis or clubbing  Neuro: alert, non focal  Skin: Warm, no lesions or rashes  COPD Stable at this time.  - continue same regimen, - congratulated her smoking cessation.  - rov 4

## 2014-06-01 ENCOUNTER — Telehealth: Payer: Self-pay | Admitting: Emergency Medicine

## 2014-06-01 ENCOUNTER — Telehealth: Payer: Self-pay

## 2014-06-01 MED ORDER — KETOTIFEN FUMARATE 0.025 % OP SOLN: 1.0000 [drp] | Freq: Two times a day (BID) | OPHTHALMIC | Status: DC | PRN

## 2014-06-01 NOTE — Telephone Encounter (Signed)
Called spoke w/ pt. She c/o watery/itchy eyes. No nasal cong, no runny nose, no PND, no cough, no SOB, no chest tx, no wheezing, nof/c/s/n/v. I advised pt she can take OTC allegra or clairitin. If no better then needs to see PCP for watery/itchy eyes. Nothing further needed

## 2014-06-01 NOTE — Telephone Encounter (Signed)
Pt states that she is itching all over, and wanted to know if she could get a prednisone rx.  Please advise

## 2014-06-01 NOTE — Telephone Encounter (Signed)
Should consider OV if does not help, or worsens, or any redness, swelling

## 2014-06-01 NOTE — Telephone Encounter (Signed)
Called spoke with pt. She ONLY c/o watery/itchy eyes. She wants prednisone called in to help with this. I advised pt she needed to call PCP since this was her only symptoms but refused to do so. Please advise RB thanks  Allergies  Allergen Reactions  . Sulfa Antibiotics Nausea And Vomiting  . Sulfonamide Derivatives Nausea And Vomiting    REACTION: n/v

## 2014-06-01 NOTE — Telephone Encounter (Signed)
We normally dont rx prednisone over the phone for this, as there could be other causes.  If not already, please consider otc zyrtec prn, moisturizing creams, or even benadryl otc prn

## 2014-06-01 NOTE — Telephone Encounter (Signed)
Pt.notified

## 2014-06-01 NOTE — Telephone Encounter (Signed)
Give her ketotifen 0.025% solution, 1 gtt in each eye up to q12h prn itching. Max dose is 2 gtt each eye in 24 hours.

## 2014-06-03 ENCOUNTER — Telehealth: Payer: Self-pay | Admitting: Internal Medicine

## 2014-06-03 NOTE — Telephone Encounter (Signed)
I do not have the form,  Was stated in her AVS form would be completed.and she would be called when complete.  Will forward to Dr. Jenny Reichmann

## 2014-06-03 NOTE — Telephone Encounter (Signed)
Not sure about this one, except I likely sent to [employer] to help filling out

## 2014-06-03 NOTE — Telephone Encounter (Signed)
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 Advised patient that I had not received form. Was form already faxed?

## 2014-06-11 ENCOUNTER — Telehealth: Payer: Self-pay | Admitting: Internal Medicine

## 2014-06-11 ENCOUNTER — Encounter: Payer: Self-pay | Admitting: Family Medicine

## 2014-06-11 ENCOUNTER — Ambulatory Visit (INDEPENDENT_AMBULATORY_CARE_PROVIDER_SITE_OTHER): Payer: Medicare Other | Admitting: Family Medicine

## 2014-06-11 ENCOUNTER — Telehealth: Payer: Self-pay | Admitting: Emergency Medicine

## 2014-06-11 VITALS — BP 122/82 | HR 76 | Temp 97.8°F | Ht 65.0 in | Wt 160.5 lb

## 2014-06-11 DIAGNOSIS — N760 Acute vaginitis: Secondary | ICD-10-CM

## 2014-06-11 MED ORDER — AZITHROMYCIN 250 MG PO TABS: 250.0000 mg | ORAL_TABLET | ORAL | Status: DC

## 2014-06-11 MED ORDER — FLUCONAZOLE 150 MG PO TABS: 150.0000 mg | ORAL_TABLET | Freq: Once | ORAL | Status: DC

## 2014-06-11 MED ORDER — PREDNISONE 10 MG PO TABS: ORAL_TABLET | ORAL | Status: DC

## 2014-06-11 NOTE — Telephone Encounter (Signed)
Please call in z-pack + pred taper > Take 40mg  daily for 3 days, then 30mg  daily for 3 days, then 20mg  daily for 3 days, then 10mg  daily for 3 days, then stop

## 2014-06-11 NOTE — Patient Instructions (Signed)
-  As we discussed, we have prescribed a new medication for you at this appointment. We discussed the common and serious potential adverse effects of this medication and you can review these and more with the pharmacist when you pick up your medication.  Please follow the instructions for use carefully and notify us immediately if you have any problems taking this medication.  -follow up with your primary doctor or gynecologist if worsens or does not resolve

## 2014-06-11 NOTE — Progress Notes (Signed)
No chief complaint on file.   HPI:  Acute visit for:  1) Itchy skin: -primarily vulvovaginal and tried monistat but didn't help - started after taking abx -PCP, Dr. Jenny Reichmann, not available -denies:vulvovaginal discharge, bleeding, fevers, rash elsewhere, concern for STI, pelvic pain  ROS: See pertinent positives and negatives per HPI.  Past Medical History  Diagnosis Date  . Asthma   . Anxiety   . COPD (chronic obstructive pulmonary disease)   . Angina   . Depression   . Hypertension   . GERD (gastroesophageal reflux disease)   . Arthritis   . Peripheral vascular disease   . Migraine   . COPD 08/24/2009    Qualifier: Diagnosis of  By: Burnett Kanaris    . Prediabetes 02/23/2014  . HYPERTENSION 05/12/2009    Qualifier: Diagnosis of  By: Lenn Cal Deborra Medina), Wynona Canes    . 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     . ALLERGIC RHINITIS 05/12/2009    Qualifier: Diagnosis of  By: Lenn Cal Deborra Medina), Wynona Canes    . Fibromyalgia 05/14/2014    Past Surgical History  Procedure Laterality Date  . Neck surgery    . Toe surgery    . Abdominal hysterectomy    . Tubal ligation    . Kidney stone surgery    . Hemorrhoid surgery    . Incise and drain abcess    . Back surgery    . Diagnostic laparoscopy    . Colonoscopy  12/20/2011    Procedure: COLONOSCOPY;  Surgeon: Landry Dyke, MD;  Location: [facility];  Service: Endoscopy;  Laterality: N/A;  . Colonoscopy  03/05/2012    Procedure: COLONOSCOPY;  Surgeon: Landry Dyke, MD;  Location: [facility];  Service: Endoscopy;  Laterality: N/A;    Family History  Problem Relation Age of Onset  . Diabetes Mother   . COPD Mother   . Heart disease Mother   . Diabetes Father   . Kidney disease Father   . Cancer Father   . Anesthesia problems Father   . Diabetes Sister   .  Hypertension Sister   . Diabetes Brother   . Sleep apnea Brother   . Heart disease Sister   . Diabetes Sister   . Heart disease Brother     History   Social History  . Marital Status: Divorced    Spouse Name: N/A    Number of Children: N/A  . Years of Education: N/A   Social History Main Topics  . Smoking status: Former Smoker    Quit date: 04/20/2011  . Smokeless tobacco: Never Used  . Alcohol Use: No  . Drug Use: No  . Sexual Activity: Not Currently    Birth Control/ Protection: Surgical   Other Topics Concern  . None   Social History Narrative  . None    Current outpatient prescriptions:albuterol (PROVENTIL HFA;VENTOLIN HFA) 108 (90 BASE) MCG/ACT inhaler, Inhale 1-2 puffs into the lungs every 6 (six) hours as needed for wheezing., Disp: 1 Inhaler, Rfl: 0;  albuterol (PROVENTIL) (5 MG/ML) 0.5% nebulizer solution, Take 0.5 mLs (2.5 mg total) by nebulization at bedtime as needed. As needed for shortness of breath, Disp: 20 mL, Rfl: 3 aspirin 325 MG EC tablet, Take 1 tablet (325 mg total) by mouth daily., Disp: 30 tablet, Rfl: 3;  atenolol (TENORMIN) 50 MG tablet, Take  1 tablet (50 mg total) by mouth daily., Disp: 30 tablet, Rfl: 4;  cyclobenzaprine (FLEXERIL) 5 MG tablet, Take 1 tablet (5 mg total) by mouth 3 (three) times daily as needed for muscle spasms., Disp: 30 tablet, Rfl: 0 ergocalciferol (VITAMIN D2) 50000 UNITS capsule, Take 1 capsule (50,000 Units total) by mouth once a week., Disp: 8 capsule, Rfl: 3;  FLUoxetine (PROZAC) 20 MG capsule, Take 1 capsule (20 mg total) by mouth daily., Disp: 90 capsule, Rfl: 3;  fluticasone (FLONASE) 50 MCG/ACT nasal spray, 2 sprays each nostril 1-2 times daily, Disp: 16 g, Rfl: 3 hydrochlorothiazide (HYDRODIURIL) 25 MG tablet, Take 1 tablet (25 mg total) by mouth daily., Disp: 30 tablet, Rfl: 4;  ketotifen (ZADITOR) 0.025 % ophthalmic solution, Place 1 drop into both eyes every 12 (twelve) hours as needed (eyes itching). Can use 2 drops  both eyes in 24 hour period., Disp: 10 mL, Rfl: 0;  meloxicam (MOBIC) 15 MG tablet, Take 1 tablet (15 mg total) by mouth daily., Disp: 30 tablet, Rfl: 4 mometasone-formoterol (DULERA) 200-5 MCG/ACT AERO, Inhale 2 puffs into the lungs 2 (two) times daily., Disp: 1 Inhaler, Rfl: 3;  omeprazole (PRILOSEC) 20 MG capsule, Take 1 capsule (20 mg total) by mouth daily., Disp: 30 capsule, Rfl: 3;  tiotropium (SPIRIVA HANDIHALER) 18 MCG inhalation capsule, Place 18 mcg into inhaler and inhale daily., Disp: , Rfl:  traMADol (ULTRAM) 50 MG tablet, Take 1 tablet (50 mg total) by mouth every 6 (six) hours as needed. For pain., Disp: 30 tablet, Rfl: 3;  fluconazole (DIFLUCAN) 150 MG tablet, Take 1 tablet (150 mg total) by mouth once., Disp: 1 tablet, Rfl: 0  EXAM:  Filed Vitals:   06/11/14 1411  BP: 122/82  Pulse: 76  Temp: 97.8 F (36.6 C)    Body mass index is 26.71 kg/(m^2).  GENERAL: vitals reviewed and listed above, alert, oriented, appears well hydrated and in no acute distress  HEENT: atraumatic, conjunttiva clear, no obvious abnormalities on inspection of external nose and ears  NECK: no obvious masses on inspection  MS: moves all extremities without noticeable abnormality  PSYCH: pleasant and cooperative, no obvious depression or anxiety  ASSESSMENT AND PLAN:  Discussed the following assessment and plan:  Vaginitis and vulvovaginitis - Plan: fluconazole (DIFLUCAN) 150 MG tablet  -declined exam or STI testing, suspect yeast, offered tx with diflucan -Patient advised to return or notify a doctor immediately if symptoms worsen or persist or new concerns arise.  Patient Instructions  -As we discussed, we have prescribed a new medication for you at this appointment. We discussed the common and serious potential adverse effects of this medication and you can review these and more with the pharmacist when you pick up your medication.  Please follow the instructions for use carefully and notify  us immediately if you have any problems taking this medication.  -follow up with your primary doctor or gynecologist if worsens or does not resolve     Kruze Atchley R.

## 2014-06-11 NOTE — Telephone Encounter (Signed)
Patient Information:  Caller Name: [name]  Phone: [phone]  Patient: [name], [name]  Gender: Female  DOB: [birth year]  Age: 53 Years  PCP: Cathlean Cower (Adults only)  Pregnant: No  Office Follow Up:  Does the office need to follow up with this patient?: No  Instructions For The Office: N/A  RN Note:  pt agrees to an appt for today.  Symptoms  Reason For Call & Symptoms: Pt is calling and states that she is itching from head to toe; took entire package of Benadryl and no relief; no rash; also has a cough and congestion; itching is the main symptom  Reviewed Health History In EMR: Yes  Reviewed Medications In EMR: Yes  Reviewed Allergies In EMR: Yes  Reviewed Surgeries / Procedures: Yes  Date of Onset of Symptoms: 05/28/2014  Treatments Tried: Benadryl  Treatments Tried Worked: No OB / GYN:  LMP: Unknown  Guideline(s) Used:  Itching - Widespread  Disposition Per Guideline:   See Today in Office  Reason For Disposition Reached:   Severe widespread itching (i.e., interferes with sleep, normal activities or school) and not improved after 24 hours of itching Care Advice  Advice Given:  Call Back If:  You become worse.  Patient Will Follow Care Advice:  YES  Appointment Scheduled:  06/11/2014 14:15:00 Appointment Scheduled Provider:  Other No appt at [facility]- appt made with Dr [name] at [facility]

## 2014-06-11 NOTE — Telephone Encounter (Signed)
Spoke with the pt  She c/o increased SOB, wheezing, prod cough with minimal green sputum, and slight chest tightness for the past 2 days  Unfortunately we have no appts to offer with limited providers  Please advise thanks! Allergies  Allergen Reactions  . Sulfa Antibiotics Nausea And Vomiting  . Sulfonamide Derivatives Nausea And Vomiting    REACTION: n/v

## 2014-06-11 NOTE — Telephone Encounter (Signed)
Pt is calling back since she hasn't heard from anyone.  [name]

## 2014-06-11 NOTE — Progress Notes (Signed)
Pre visit review using our clinic review tool, if applicable. No additional management support is needed unless otherwise documented below in the visit note. 

## 2014-06-11 NOTE — Telephone Encounter (Signed)
Spoke with the pt and notified of recs per RB  She verbalized understanding and nothing further needed  Rxs were sent

## 2014-06-22 ENCOUNTER — Encounter: Payer: Self-pay | Admitting: Emergency Medicine

## 2014-06-22 ENCOUNTER — Other Ambulatory Visit (INDEPENDENT_AMBULATORY_CARE_PROVIDER_SITE_OTHER): Payer: Medicare Other

## 2014-06-22 ENCOUNTER — Ambulatory Visit (INDEPENDENT_AMBULATORY_CARE_PROVIDER_SITE_OTHER): Payer: Medicare Other | Admitting: Emergency Medicine

## 2014-06-22 VITALS — BP 130/78 | HR 77 | Ht 65.0 in | Wt 159.8 lb

## 2014-06-22 DIAGNOSIS — R42 Dizziness and giddiness: Secondary | ICD-10-CM

## 2014-06-22 DIAGNOSIS — J441 Chronic obstructive pulmonary disease with (acute) exacerbation: Secondary | ICD-10-CM

## 2014-06-22 DIAGNOSIS — N898 Other specified noninflammatory disorders of vagina: Secondary | ICD-10-CM

## 2014-06-22 DIAGNOSIS — J309 Allergic rhinitis, unspecified: Secondary | ICD-10-CM | POA: Diagnosis not present

## 2014-06-22 DIAGNOSIS — R5381 Other malaise: Secondary | ICD-10-CM | POA: Diagnosis not present

## 2014-06-22 DIAGNOSIS — R5383 Other fatigue: Secondary | ICD-10-CM

## 2014-06-22 DIAGNOSIS — R531 Weakness: Secondary | ICD-10-CM

## 2014-06-22 DIAGNOSIS — N939 Abnormal uterine and vaginal bleeding, unspecified: Secondary | ICD-10-CM

## 2014-06-22 LAB — CBC WITH DIFFERENTIAL/PLATELET
Basophils Absolute: 0 10*3/uL (ref 0.0–0.1)
Basophils Relative: 0.3 % (ref 0.0–3.0)
Eosinophils Absolute: 0.2 10*3/uL (ref 0.0–0.7)
Eosinophils Relative: 1.9 % (ref 0.0–5.0)
HCT: 38.1 % (ref 36.0–46.0)
Hemoglobin: 12.7 g/dL (ref 12.0–15.0)
Lymphocytes Relative: 28.7 % (ref 12.0–46.0)
Lymphs Abs: 3.8 10*3/uL (ref 0.7–4.0)
MCHC: 33.4 g/dL (ref 30.0–36.0)
MCV: 92.7 fl (ref 78.0–100.0)
Monocytes Absolute: 0.9 10*3/uL (ref 0.1–1.0)
Monocytes Relative: 6.5 % (ref 3.0–12.0)
Neutro Abs: 8.3 10*3/uL — ABNORMAL HIGH (ref 1.4–7.7)
Neutrophils Relative %: 62.6 % (ref 43.0–77.0)
Platelets: 362 10*3/uL (ref 150.0–400.0)
RBC: 4.11 Mil/uL (ref 3.87–5.11)
RDW: 14.8 % (ref 11.5–15.5)
WBC: 13.2 10*3/uL — ABNORMAL HIGH (ref 4.0–10.5)

## 2014-06-22 LAB — TSH: TSH: 0.88 u[IU]/mL (ref 0.35–4.50)

## 2014-06-22 NOTE — Assessment & Plan Note (Signed)
-   continue fluticasone.

## 2014-06-22 NOTE — Addendum Note (Signed)
Addended by: Carlos American A on: 06/22/2014 10:41 AM   Modules accepted: Orders

## 2014-06-22 NOTE — Progress Notes (Signed)
53 yo former smoker with hx HTN, migraine HA, asthma dx in 2000 after a hospitalization for dyspnea. No asthma as a child. Had been managed on Advair 250 (ran out), now on Dulera + SABA since hosp. She was hospitalized 1/7 - 1/13 for an acute exacerbation.  She has been doing fairly well, is having some stabbing pains with exertion (prior cards eval was reassuring). She had gone back to smoking since last Summer, just quit again when hospitalized. She is weaning pred now. Uses albuterol every day at least once. She is planning to restart exercise routine. She is taking benadryl for itching. She is having a lot of nasal gtt and UA irritation.    Acute OV 05/28/13 -- she has been having more problems with congestion, more problems with wheeze, cough, dyspnea. She was on Dulera, nasonex - hasn't been able to take in months because she lost her job.   ROV 08/07/13 -- f/u COPD, rhinitis, cough. Was seen since last visit by Dr Melvyn Novas with wheeze, sputum, tightness. Was not taking her BD's - couldn;t afford. Was treated for sinusitis with augmentin and pred.  Also changed to bystolic. 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 Using dulera bid, albuterol 2-3 x a day.   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 She had stopped smoking for 2 months, has started back now about 1/2 pk a day. She is on [place] - feels that she benefited from the addition of spiriva. She was just treated with prednisone for an AE with good resolution > she believes it was house full of smoke that triggered her. Uses albuterol every day. Minimal cough or wheeze.   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 She has been doing fairly well, although she just moved to an apartment with 3 flights of stairs. She quit smoking June 2. She is having some insomnia and has been eating a lot. Her cough has been stable. She was just treated for  otitis media. She restarted flonase.   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 I treated her for an AE 6/26 (azithro + pred) > her wheeze improved but she is dealing with fatigue, dizziness, weakness when walking. She has seen some vaginal bleeding x 1 (even though has had a hysterectomy). She has noted bruising on her arms and her thighs since this last weekend.   Filed Vitals:   06/22/14 1003  BP: 130/78  Pulse: 77  Height: 5\' 5"  (1.651 m)  Weight: 159 lb 12.8 oz (72.485 kg)  SpO2: 97%   Gen: Pleasant, well-nourished, in no distress,  normal affect  ENT: No lesions,  mouth clear,  oropharynx clear, very congested, hoarse voice  Neck: No JVD, no TMG, no carotid bruits  Lungs: No use of accessory muscles,  Very distant but no wheezing  Cardiovascular: RRR, heart sounds normal, no murmur or gallops, no peripheral edema  Musculoskeletal: No deformities, no cyanosis or clubbing  Neuro: alert, non focal  Skin: Warm, no lesions or rashes  Dizziness Etiology unclear, but I am concerned that she is anemic, especially given her report of abnormal vaginal bleeding, bruising. We seem to have treated the apparent COPD flare - ? Whether her persistent sx are med-related.  - check CBC and TSH - walking oximetry to insure not desaturated - underscored importance of setting up appt with  gynecology - will forward this info to Dr Jenny Reichmann to keep the workup going - rov 1  COPD exacerbation Appears to be improved.  - continue maintenance meds.   ALLERGIC RHINITIS - continue fluticasone.

## 2014-06-22 NOTE — Patient Instructions (Addendum)
Continue your inhaled medications as you have been taking them  We will check blood work today.  Walking oxygen testing today.  Stop your aspirin until we call you with your blood work Please follow with gynecology as planned Follow with Dr Lamonte Sakai in 1 month

## 2014-06-22 NOTE — Assessment & Plan Note (Signed)
Etiology unclear, but I am concerned that she is anemic, especially given her report of abnormal vaginal bleeding, bruising. We seem to have treated the apparent COPD flare - ? Whether her persistent sx are med-related.  - check CBC and TSH - walking oximetry to insure not desaturated - underscored importance of setting up appt with gynecology - will forward this info to Dr Jenny Reichmann to keep the workup going - rov 1

## 2014-06-22 NOTE — Assessment & Plan Note (Signed)
Appears to be improved.  - continue maintenance meds.

## 2014-06-23 ENCOUNTER — Other Ambulatory Visit: Payer: Self-pay | Admitting: Emergency Medicine

## 2014-06-23 ENCOUNTER — Telehealth: Payer: Self-pay | Admitting: Emergency Medicine

## 2014-06-23 DIAGNOSIS — N939 Abnormal uterine and vaginal bleeding, unspecified: Secondary | ICD-10-CM

## 2014-06-23 NOTE — Telephone Encounter (Signed)
Spoke with pt and advised to call her primary care md about the itching because they are the ones treating her for this.  Pt verbalized understanding.

## 2014-06-24 ENCOUNTER — Telehealth: Payer: Self-pay | Admitting: *Deleted

## 2014-06-24 MED ORDER — HYDROXYZINE HCL 25 MG PO TABS: 25.0000 mg | ORAL_TABLET | Freq: Three times a day (TID) | ORAL | Status: DC | PRN

## 2014-06-24 NOTE — Telephone Encounter (Signed)
Ok for generic atarax prn, done erx

## 2014-06-24 NOTE — Telephone Encounter (Signed)
Left msg on triage stating that she has tried everything ovc for this itching. She states she is itching all over. Requesting md to rx something g for the itching...[name]

## 2014-06-25 NOTE — Telephone Encounter (Signed)
Notified pt with md response. Rx already been sent to pharmacy.Marland KitchenJohny [name]

## 2014-06-28 ENCOUNTER — Telehealth: Payer: Self-pay | Admitting: Critical Care Medicine

## 2014-06-28 MED ORDER — PREDNISONE 10 MG PO TABS: ORAL_TABLET | ORAL | Status: DC

## 2014-06-28 NOTE — Telephone Encounter (Signed)
Prednisone 10mg  called in Take 4 for two days three for two days two for two days one for two days #20 Also told to use delsym OTC

## 2014-06-29 ENCOUNTER — Telehealth: Payer: Self-pay | Admitting: Emergency Medicine

## 2014-06-29 NOTE — Telephone Encounter (Signed)
Called spoke with pt. Offered her appt today with MW. She only wanted to see RB. Advised her he had no openings. She reports she will call her PCP to see about an appt and if she can't see him then she will call us back to see MW. Nothing further needed

## 2014-07-09 ENCOUNTER — Ambulatory Visit (INDEPENDENT_AMBULATORY_CARE_PROVIDER_SITE_OTHER): Payer: Medicare Other | Admitting: Internal Medicine

## 2014-07-09 ENCOUNTER — Encounter: Payer: Self-pay | Admitting: Internal Medicine

## 2014-07-09 VITALS — BP 130/80 | HR 78 | Temp 98.4°F | Wt 163.6 lb

## 2014-07-09 DIAGNOSIS — H6980 Other specified disorders of Eustachian tube, unspecified ear: Secondary | ICD-10-CM | POA: Diagnosis not present

## 2014-07-09 DIAGNOSIS — J309 Allergic rhinitis, unspecified: Secondary | ICD-10-CM | POA: Diagnosis not present

## 2014-07-09 DIAGNOSIS — H6982 Other specified disorders of Eustachian tube, left ear: Secondary | ICD-10-CM

## 2014-07-09 NOTE — Patient Instructions (Signed)
Go to [facility] for eustachian tube dysfunction. Drink thin  fluids liberally through the day and chew sugarless gum . Do the Valsalva maneuver several times a day to "pop" ears open. Flonase  OR Nasacort AQ 1 spray in each nostril twice a day as needed. Use the "crossover" technique as discussed. Use a Neti pot daily- 2x /day  as needed for sinus congestion   If also having head congestion do the following: Plain Mucinex (NOT D) for thick secretions ;force NON dairy fluids .   Nasal cleansing in the shower as discussed with lather of mild shampoo.After 10 seconds wash off lather while  exhaling through nostrils. Make sure that all residual soap is removed to prevent irritation.  Flonase OR Nasacort AQ 1 spray in each nostril twice a day as needed. Use the "crossover" technique into opposite nostril spraying toward opposite ear @ 45 degree angle, not straight up into nostril.  Use a Neti pot daily only  as needed for significant sinus congestion; going from open side to congested side . Plain Allegra (NOT D )  160 daily , Loratidine 10 mg , OR Zyrtec 10 mg @ bedtime  as needed for itchy eyes & sneezing.

## 2014-07-09 NOTE — Progress Notes (Signed)
   Subjective:    Patient ID: [name], female    DOB: 21-Sep-1961, 53 y.o.   MRN: [id number]  HPI  She's had recurrent pain in the left mastoid area and in the ear approximately every other month for the last 6 months. The symptoms will last 5 days as intermittent throbbing pain in this area lasting seconds. It is nonradiating.  Sometimes she will have occasional slight dizziness with the symptoms. There is no associated hearing loss or tinnitus.  She has taken antibiotics for this as well as Mucinex and Robitussin with only minimal response  She believes there may be environmental factors such as dust or pollen as triggers  The symptoms are also associated with congestion and nonproductive cough  A nonproductive cough is associated with some shortness of breath.  She does have a history of perennial allergies  She will sometimes have discomfort in her teeth as well as itchy eyes and sneezing.   Review of Systems  There is no associated fever, chills, sweats, purulent sputum, wheezing, frontal headache, facial pain, or nasal purulence.       Objective:   Physical Exam General appearance:good health ;well nourished; no acute distress or increased work of breathing is present.  No  lymphadenopathy about the head, neck, or axilla noted.   Eyes: No conjunctival inflammation or lid edema is present. There is no scleral icterus.  Ears:  External ear exam shows no significant lesions or deformities.  Otoscopic examination reveals clear canals, tympanic membranes are intact bilaterally without bulging, retraction, inflammation or discharge.  Nose:  External nasal examination shows no deformity or inflammation. Nasal mucosa are pink and moist without lesions or exudates. No septal dislocation or deviation.No obstruction to airflow.   Oral exam: Dental hygiene is good; lips and gums are healthy appearing.There is no oropharyngeal erythema or exudate noted.   Neck:  No  deformities, thyromegaly, masses, or tenderness noted.   Supple with full range of motion without pain.   Heart:  Normal rate and regular rhythm. S1 and S2 normal without gallop, murmur, click, rub or other extra sounds.   Lungs:Chest clear to auscultation; no wheezes, rhonchi,rales ,or rubs present.No increased work of breathing.    Extremities:  No cyanosis, edema, or clubbing  noted    Skin: Warm & dry w/o jaundice or tenting.         Assessment & Plan:  #1 allergic rhinitis See orders & AVS

## 2014-07-09 NOTE — Progress Notes (Signed)
Pre visit review using our clinic review tool, if applicable. No additional management support is needed unless otherwise documented below in the visit note. 

## 2014-07-12 ENCOUNTER — Ambulatory Visit (INDEPENDENT_AMBULATORY_CARE_PROVIDER_SITE_OTHER): Payer: Medicare Other | Admitting: Gynecology

## 2014-07-12 ENCOUNTER — Encounter: Payer: Self-pay | Admitting: Gynecology

## 2014-07-12 VITALS — BP 124/80 | Ht 62.5 in | Wt 161.0 lb

## 2014-07-12 DIAGNOSIS — N898 Other specified noninflammatory disorders of vagina: Secondary | ICD-10-CM | POA: Diagnosis not present

## 2014-07-12 DIAGNOSIS — F17200 Nicotine dependence, unspecified, uncomplicated: Secondary | ICD-10-CM | POA: Diagnosis not present

## 2014-07-12 DIAGNOSIS — N941 Unspecified dyspareunia: Secondary | ICD-10-CM | POA: Insufficient documentation

## 2014-07-12 DIAGNOSIS — IMO0002 Reserved for concepts with insufficient information to code with codable children: Secondary | ICD-10-CM | POA: Diagnosis not present

## 2014-07-12 DIAGNOSIS — N951 Menopausal and female climacteric states: Secondary | ICD-10-CM | POA: Diagnosis not present

## 2014-07-12 DIAGNOSIS — N939 Abnormal uterine and vaginal bleeding, unspecified: Secondary | ICD-10-CM

## 2014-07-12 MED ORDER — NONFORMULARY OR COMPOUNDED ITEM: Status: DC

## 2014-07-12 NOTE — Progress Notes (Signed)
   The patient is a 53 year old who was referred to our practice as a courtesy of Dr. Lamonte Sakai after having experienced vaginal spotting after intercourse. We went over her gynecological history and she had informed me that many years ago as a result of leiomyomatous uteri and menorrhagia she had a total abdominal history without removal of her ovaries. Prior to that she reports having normal Pap smears. She has never been on any hormone replacement therapy and recently was 8. Seeing some vasomotor symptoms. She has history of COPD in the past and she has begun to smoke again. She has history of 2013 a colonoscopy whereby polyps were removed. Patient denies any GU or GI complaints.  Exam: Abdomen: Soft nontender no rebound or guarding Pelvic: Urethra Skene was within normal limits Vagina: Vaginal cuff intact no lesion seen atrophic mucosa was noted Bimanual exam no palpable mass or tenderness Rectovaginal exam unremarkable  Assessment/plan: Perimenopausal/ Menopausal symptoms on this patient with history of COPD and smoker. We're going to start her on vaginal estrogen to apply twice a week for her vaginal atrophy. The source of spotting may have been attributed to trauma during intercourse because of an atrophic vaginal mucosa. We'll check her [place] level today but will not start her on any oral or transdermal HRT and so she stopped smoking.

## 2014-07-12 NOTE — Patient Instructions (Addendum)
Menopause Menopause is the normal time of life when menstrual periods stop completely. Menopause is complete when you have missed 12 consecutive menstrual periods. It usually occurs between the ages of 48 years and 55 years. Very rarely does a woman develop menopause before the age of 40 years. At menopause, your ovaries stop producing the female hormones estrogen and progesterone. This can cause undesirable symptoms and also affect your health. Sometimes the symptoms may occur 4-5 years before the menopause begins. There is no relationship between menopause and:  Oral contraceptives.  Number of children you had.  Race.  The age your menstrual periods started (menarche). Heavy smokers and very thin women may develop menopause earlier in life. CAUSES  The ovaries stop producing the female hormones estrogen and progesterone.  Other causes include:  Surgery to remove both ovaries.  The ovaries stop functioning for no known reason.  Tumors of the pituitary gland in the brain.  Medical disease that affects the ovaries and hormone production.  Radiation treatment to the abdomen or pelvis.  Chemotherapy that affects the ovaries. SYMPTOMS   Hot flashes.  Night sweats.  Decrease in sex drive.  Vaginal dryness and thinning of the vagina causing painful intercourse.  Dryness of the skin and developing wrinkles.  Headaches.  Tiredness.  Irritability.  Memory problems.  Weight gain.  Bladder infections.  Hair growth of the face and chest.  Infertility. More serious symptoms include:  Loss of bone (osteoporosis) causing breaks (fractures).  Depression.  Hardening and narrowing of the arteries (atherosclerosis) causing heart attacks and strokes. DIAGNOSIS   When the menstrual periods have stopped for 12 straight months.  Physical exam.  Hormone studies of the blood. TREATMENT  There are many treatment choices and nearly as many questions about them. The  decisions to treat or not to treat menopausal changes is an individual choice made with your health care provider. Your health care provider can discuss the treatments with you. Together, you can decide which treatment will work best for you. Your treatment choices may include:   Hormone therapy (estrogen and progesterone).  Non-hormonal medicines.  Treating the individual symptoms with medicine (for example antidepressants for depression).  Herbal medicines that may help specific symptoms.  Counseling by a psychiatrist or psychologist.  Group therapy.  Lifestyle changes including:  Eating healthy.  Regular exercise.  Limiting caffeine and alcohol.  Stress management and meditation.  No treatment. HOME CARE INSTRUCTIONS   Take the medicine your health care provider gives you as directed.  Get plenty of sleep and rest.  Exercise regularly.  Eat a diet that contains calcium (good for the bones) and soy products (acts like estrogen hormone).  Avoid alcoholic beverages.  Do not smoke.  If you have hot flashes, dress in layers.  Take supplements, calcium, and vitamin D to strengthen bones.  You can use over-the-counter lubricants or moisturizers for vaginal dryness.  Group therapy is sometimes very helpful.  Acupuncture may be helpful in some cases. SEEK MEDICAL CARE IF:   You are not sure you are in menopause.  You are having menopausal symptoms and need advice and treatment.  You are still having menstrual periods after age 55 years.  You have pain with intercourse.  Menopause is complete (no menstrual period for 12 months) and you develop vaginal bleeding.  You need a referral to a specialist (gynecologist, psychiatrist, or psychologist) for treatment. SEEK IMMEDIATE MEDICAL CARE IF:   You have severe depression.  You have excessive vaginal bleeding.    You fell and think you have a broken bone.  You have pain when you urinate.  You develop leg or  chest pain.  You have a fast pounding heart beat (palpitations).  You have severe headaches.  You develop vision problems.  You feel a lump in your breast.  You have abdominal pain or severe indigestion. Document Released: 02/23/2004 Document Revised: 08/05/2013 Document Reviewed: 07/02/2013 [facility] Patient Information 2015 [place], [place]. This information is not intended to replace advice given to you by your health care provider. Make sure you discuss any questions you have with your health care provider. Hormone Therapy At menopause, your body begins making less estrogen and progesterone hormones. This causes the body to stop having menstrual periods. This is because estrogen and progesterone hormones control your periods and menstrual cycle. A lack of estrogen may cause symptoms such as: Hot flushes (or hot flashes). Vaginal dryness. Dry skin. Loss of sex drive. Risk of bone loss (osteoporosis). When this happens, you may choose to take hormone therapy to get back the estrogen lost during menopause. When the hormone estrogen is given alone, it is usually referred to as ET (Estrogen Therapy). When the hormone progestin is combined with estrogen, it is generally called HT (Hormone Therapy). This was formerly known as hormone replacement therapy (HRT). Your caregiver can help you make a decision on what will be best for you. The decision to use HT seems to change often as new studies are done. Many studies do not agree on the benefits of hormone replacement therapy. LIKELY BENEFITS OF HT INCLUDE PROTECTION FROM: Hot Flushes (also called hot flashes) - A hot flush is a sudden feeling of heat that spreads over the face and body. The skin may redden like a blush. It is connected with sweats and sleep disturbance. Women going through menopause may have hot flushes a few times a month or several times per day depending on the woman. Osteoporosis (bone loss)- Estrogen helps guard against bone  loss. After menopause, a woman's bones slowly lose calcium and become weak and brittle. As a result, bones are more likely to break. The hip, wrist, and spine are affected most often. Hormone therapy can help slow bone loss after menopause. Weight bearing exercise and taking calcium with vitamin D also can help prevent bone loss. There are also medications that your caregiver can prescribe that can help prevent osteoporosis. Vaginal Dryness - Loss of estrogen causes changes in the vagina. Its lining may become thin and dry. These changes can cause pain and bleeding during sexual intercourse. Dryness can also lead to infections. This can cause burning and itching. (Vaginal estrogen treatment can help relieve pain, itching, and dryness.) Urinary Tract Infections are more common after menopause because of lack of estrogen. Some women also develop urinary incontinence because of low estrogen levels in the vagina and bladder. Possible other benefits of estrogen include a positive effect on mood and short-term memory in women. RISKS AND COMPLICATIONS Using estrogen alone without progesterone causes the lining of the uterus to grow. This increases the risk of lining of the uterus (endometrial) cancer. Your caregiver should give another hormone called progestin if you have a uterus. Women who take combined (estrogen and progestin) HT appear to have an increased risk of breast cancer. The risk appears to be small, but increases throughout the time that HT is taken. Combined therapy also makes the breast tissue slightly denser which makes it harder to read mammograms (breast X-rays). Combined, estrogen and progesterone therapy  can be taken together every day, in which case there may be spotting of blood. HT therapy can be taken cyclically in which case you will have menstrual periods. Cyclically means HT is taken for a set amount of days, then not taken, then this process is repeated. HT may increase the risk of  stroke, heart attack, breast cancer and forming blood clots in your leg. Transdermal estrogen (estrogen that is absorbed through the skin with a patch or a cream) may have more positive results with: Cholesterol. Blood pressure. Blood clots. Having the following conditions may indicate you should not have HT: Endometrial cancer. Liver disease. Breast cancer. Heart disease. History of blood clots. Stroke. TREATMENT  If you choose to take HT and have a uterus, usually estrogen and progestin are prescribed. Your caregiver will help you decide the best way to take the medications. Possible ways to take estrogen include: Pills. Patches. Gels. Sprays. Vaginal estrogen cream, rings and tablets. It is best to take the lowest dose possible that will help your symptoms and take them for the shortest period of time that you can. Hormone therapy can help relieve some of the problems (symptoms) that affect women at menopause. Before making a decision about HT, talk to your caregiver about what is best for you. Be well informed and comfortable with your decisions. HOME CARE INSTRUCTIONS  Follow your caregivers advice when taking the medications. A Pap test is done to screen for cervical cancer. The first Pap test should be done at age 80. Between ages 7 and 50, Pap tests are repeated every 2 years. Beginning at age 53, you are advised to have a Pap test every 3 years as long as your past 3 Pap tests have been normal. Some women have medical problems that increase the chance of getting cervical cancer. Talk to your caregiver about these problems. It is especially important to talk to your caregiver if a new problem develops soon after your last Pap test. In these cases, your caregiver may recommend more frequent screening and Pap tests. The above recommendations are the same for women who have or have not gotten the vaccine for HPV (Human Papillomavirus). If you had a hysterectomy for a problem that  was not a cancer or a condition that could lead to cancer, then you no longer need Pap tests. However, even if you no longer need a Pap test, a regular exam is a good idea to make sure no other problems are starting.  If you are between ages 91 and 73, and you have had normal Pap tests going back 10 years, you no longer need Pap tests. However, even if you no longer need a Pap test, a regular exam is a good idea to make sure no other problems are starting.  If you have had past treatment for cervical cancer or a condition that could lead to cancer, you need Pap tests and screening for cancer for at least 20 years after your treatment. If Pap tests have been discontinued, risk factors (such as a new sexual partner) need to be re-assessed to determine if screening should be resumed. Some women may need screenings more often if they are at high risk for cervical cancer. Get mammograms done as per the advice of your caregiver. SEEK IMMEDIATE MEDICAL CARE IF: You develop abnormal vaginal bleeding. You have pain or swelling in your legs, shortness of breath, or chest pain. You develop dizziness or headaches. You have lumps or changes in your breasts or  armpits. You have slurred speech. You develop weakness or numbness of your arms or legs. You have pain, burning, or bleeding when urinating. You develop abdominal pain. Document Released: 09/01/2003 Document Revised: 02/25/2012 Document Reviewed: 12/20/2010 [facility] Patient Information 2015 [place], [place]. This information is not intended to replace advice given to you by your health care provider. Make sure you discuss any questions you have with your health care provider.

## 2014-07-13 ENCOUNTER — Telehealth: Payer: Self-pay | Admitting: Emergency Medicine

## 2014-07-13 ENCOUNTER — Telehealth: Payer: Self-pay | Admitting: Internal Medicine

## 2014-07-13 LAB — FOLLICLE STIMULATING HORMONE: FSH: 65.9 m[IU]/mL

## 2014-07-13 MED ORDER — PROMETHAZINE-CODEINE 6.25-10 MG/5ML PO SYRP: 5.0000 mL | ORAL_SOLUTION | ORAL | Status: DC | PRN

## 2014-07-13 NOTE — Telephone Encounter (Signed)
Faxed hardcopy to [facility].  Called the patient to inform.

## 2014-07-13 NOTE — Telephone Encounter (Signed)
Called spoke with pt. appt scheduled to see mw Tomorrow for acute visit. Nothing further needed

## 2014-07-13 NOTE — Telephone Encounter (Signed)
Patient is calling to request cough syrup with Codeine for her cough. She came in last week but says she still does not feel any better and she is in a wedding this weekend. Please advise.

## 2014-07-13 NOTE — Telephone Encounter (Signed)
Done hardcopy to [facility]  

## 2014-07-14 ENCOUNTER — Ambulatory Visit: Payer: Medicare Other | Admitting: Internal Medicine

## 2014-07-14 NOTE — Telephone Encounter (Signed)
Patient called today to inform pharmacy did not receive cough syrup faxed in last night.  Did call the pharmacy and phoned in cough medication , informed the patient as well.

## 2014-07-15 ENCOUNTER — Ambulatory Visit: Payer: Self-pay | Admitting: Gynecology

## 2014-07-19 DIAGNOSIS — L708 Other acne: Secondary | ICD-10-CM | POA: Diagnosis not present

## 2014-07-19 DIAGNOSIS — B079 Viral wart, unspecified: Secondary | ICD-10-CM | POA: Diagnosis not present

## 2014-07-23 ENCOUNTER — Ambulatory Visit: Payer: Medicare Other | Admitting: Emergency Medicine

## 2014-08-07 ENCOUNTER — Ambulatory Visit (INDEPENDENT_AMBULATORY_CARE_PROVIDER_SITE_OTHER): Payer: Medicare Other | Admitting: Family Medicine

## 2014-08-07 ENCOUNTER — Encounter: Payer: Self-pay | Admitting: Family Medicine

## 2014-08-07 VITALS — BP 132/78 | HR 81 | Temp 98.4°F | Wt 163.2 lb

## 2014-08-07 DIAGNOSIS — L538 Other specified erythematous conditions: Secondary | ICD-10-CM

## 2014-08-07 DIAGNOSIS — L304 Erythema intertrigo: Secondary | ICD-10-CM | POA: Insufficient documentation

## 2014-08-07 MED ORDER — NYSTATIN 100000 UNIT/GM EX POWD: CUTANEOUS | Status: DC

## 2014-08-07 NOTE — Patient Instructions (Signed)
Good to see you. You have a fungal infection under your breasts.  Use nystatin powder three times daily for 7 days- please call your doctor if it is not better by then.

## 2014-08-07 NOTE — Assessment & Plan Note (Signed)
New.  eRx sent for nystatin powder to area three times daily x 7 days. Call or return to clinic prn if these symptoms worsen or fail to improve as anticipated. The patient indicates understanding of these issues and agrees with the plan.

## 2014-08-07 NOTE — Progress Notes (Signed)
Pre visit review using our clinic review tool, if applicable. No additional management support is needed unless otherwise documented below in the visit note. 

## 2014-08-07 NOTE — Progress Notes (Signed)
Subjective:   Patient ID: [name], female    DOB: 05-21-1961, 53 y.o.   MRN: [id number]  [name] is a pleasant 53 y.o. year old female new to me who presents to clinic today with Rash  on 08/07/2014  HPI: Was outside at her brother's wedding last weekend. Next day, both of her feet were itchy and sweaty but that has since resolved.  Now has a very itchy rash under both of her breasts.  Has never had anything like this before.  Antihistamines not helping.   Current Outpatient Prescriptions on File Prior to Visit  Medication Sig Dispense Refill  . albuterol (PROVENTIL HFA;VENTOLIN HFA) 108 (90 BASE) MCG/ACT inhaler Inhale 1-2 puffs into the lungs every 6 (six) hours as needed for wheezing.  1 Inhaler  0  . albuterol (PROVENTIL) (5 MG/ML) 0.5% nebulizer solution Take 0.5 mLs (2.5 mg total) by nebulization at bedtime as needed. As needed for shortness of breath  20 mL  3  . aspirin 325 MG EC tablet Take 1 tablet (325 mg total) by mouth daily.  30 tablet  3  . atenolol (TENORMIN) 50 MG tablet Take 1 tablet (50 mg total) by mouth daily.  30 tablet  4  . cyclobenzaprine (FLEXERIL) 5 MG tablet Take 1 tablet (5 mg total) by mouth 3 (three) times daily as needed for muscle spasms.  30 tablet  0  . ergocalciferol (VITAMIN D2) 50000 UNITS capsule Take 1 capsule (50,000 Units total) by mouth once a week.  8 capsule  3  . FLUoxetine (PROZAC) 20 MG capsule Take 1 capsule (20 mg total) by mouth daily.  90 capsule  3  . fluticasone (FLONASE) 50 MCG/ACT nasal spray 2 sprays each nostril 1-2 times daily  16 g  3  . hydrochlorothiazide (HYDRODIURIL) 25 MG tablet Take 1 tablet (25 mg total) by mouth daily.  30 tablet  4  . hydrOXYzine (ATARAX/VISTARIL) 25 MG tablet Take 1 tablet (25 mg total) by mouth 3 (three) times daily as needed.  30 tablet  0  . ketotifen (ZADITOR) 0.025 % ophthalmic solution Place 1 drop into both eyes every 12 (twelve) hours as needed (eyes itching). Can use 2  drops both eyes in 24 hour period.  10 mL  0  . meloxicam (MOBIC) 15 MG tablet Take 1 tablet (15 mg total) by mouth daily.  30 tablet  4  . mometasone-formoterol (DULERA) 200-5 MCG/ACT AERO Inhale 2 puffs into the lungs 2 (two) times daily.  1 Inhaler  3  . NONFORMULARY OR COMPOUNDED ITEM Estradiol 0.02 % 6ml prefilled applicator Sig: apply twice a week  24 each  4  . omeprazole (PRILOSEC) 20 MG capsule Take 1 capsule (20 mg total) by mouth daily.  30 capsule  3  . promethazine-codeine (PHENERGAN WITH CODEINE) 6.25-10 MG/5ML syrup Take 5 mLs by mouth every 4 (four) hours as needed for cough.  120 mL  0  . tiotropium (SPIRIVA HANDIHALER) 18 MCG inhalation capsule Place 18 mcg into inhaler and inhale daily.      . traMADol (ULTRAM) 50 MG tablet Take 1 tablet (50 mg total) by mouth every 6 (six) hours as needed. For pain.  30 tablet  3   No current facility-administered medications on file prior to visit.    Allergies  Allergen Reactions  . Sulfa Antibiotics Nausea And Vomiting  . Sulfonamide Derivatives Nausea And Vomiting    REACTION: n/v    Past Medical History  Diagnosis  Date  . Asthma   . Anxiety   . COPD (chronic obstructive pulmonary disease)   . Angina   . Depression   . Hypertension   . GERD (gastroesophageal reflux disease)   . Arthritis   . Peripheral vascular disease   . Migraine   . COPD 08/24/2009    Qualifier: Diagnosis of  By: Burnett Kanaris    . Prediabetes 02/23/2014  . HYPERTENSION 05/12/2009    Qualifier: Diagnosis of  By: Lenn Cal Deborra Medina), Wynona Canes    . 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     . ALLERGIC RHINITIS 05/12/2009    Qualifier: Diagnosis of  By: Lenn Cal Deborra Medina), Wynona Canes    . Fibromyalgia 05/14/2014    Past Surgical History  Procedure Laterality Date  . Neck surgery    . Toe  surgery    . Abdominal hysterectomy    . Tubal ligation    . Kidney stone surgery    . Hemorrhoid surgery    . Incise and drain abcess    . Back surgery    . Diagnostic laparoscopy    . Colonoscopy  12/20/2011    Procedure: COLONOSCOPY;  Surgeon: Landry Dyke, MD;  Location: [facility];  Service: Endoscopy;  Laterality: N/A;  . Colonoscopy  03/05/2012    Procedure: COLONOSCOPY;  Surgeon: Landry Dyke, MD;  Location: [facility];  Service: Endoscopy;  Laterality: N/A;    Family History  Problem Relation Age of Onset  . Diabetes Mother   . COPD Mother   . Heart disease Mother   . Diabetes Father   . Kidney disease Father   . Cancer Father   . Anesthesia problems Father   . Diabetes Sister   . Hypertension Sister   . Diabetes Brother   . Sleep apnea Brother   . Heart disease Sister   . Diabetes Sister   . Heart disease Brother     History   Social History  . Marital Status: Divorced    Spouse Name: N/A    Number of Children: N/A  . Years of Education: N/A   Occupational History  . Not on file.   Social History Main Topics  . Smoking status: Former Smoker    Quit date: 04/20/2011  . Smokeless tobacco: Never Used  . Alcohol Use: No  . Drug Use: No  . Sexual Activity: Yes    Birth Control/ Protection: Surgical   Other Topics Concern  . Not on file   Social History Narrative  . No narrative on file   The PMH, PSH, Social History, Family History, Medications, and allergies have been reviewed in [facility], and have been updated if relevant.   Review of Systems    See HPI No fevers No n/v/d No LE edema- feet were swollen but that has resolved Objective:    BP 132/78  Pulse 81  Temp(Src) 98.4 F (36.9 C) (Oral)  Wt 163 lb 4 oz (74.05 kg)  SpO2 98%   Physical Exam  Nursing note and vitals reviewed. Constitutional: She appears well-developed and well-nourished. No distress.  HENT:  Head: Normocephalic.  Skin: Skin is warm and dry. Rash noted.    Hyperpigmented, confluent raised rash under breasts bilaterally  No weeping  Psychiatric: She has a normal mood and affect. Her behavior is normal. Judgment and thought content normal.  Assessment & Plan:   [place] No Follow-up on file.

## 2014-08-24 ENCOUNTER — Ambulatory Visit (INDEPENDENT_AMBULATORY_CARE_PROVIDER_SITE_OTHER): Payer: Medicare Other | Admitting: Internal Medicine

## 2014-08-24 ENCOUNTER — Encounter: Payer: Self-pay | Admitting: Internal Medicine

## 2014-08-24 VITALS — BP 138/92 | HR 66 | Temp 98.2°F | Wt 164.0 lb

## 2014-08-24 DIAGNOSIS — I10 Essential (primary) hypertension: Secondary | ICD-10-CM | POA: Diagnosis not present

## 2014-08-24 DIAGNOSIS — J45909 Unspecified asthma, uncomplicated: Secondary | ICD-10-CM | POA: Diagnosis not present

## 2014-08-24 DIAGNOSIS — R7303 Prediabetes: Secondary | ICD-10-CM

## 2014-08-24 DIAGNOSIS — R7309 Other abnormal glucose: Secondary | ICD-10-CM | POA: Diagnosis not present

## 2014-08-24 DIAGNOSIS — R21 Rash and other nonspecific skin eruption: Secondary | ICD-10-CM

## 2014-08-24 MED ORDER — PREDNISONE 10 MG PO TABS: ORAL_TABLET | ORAL | Status: DC

## 2014-08-24 MED ORDER — METHYLPREDNISOLONE ACETATE 80 MG/ML IJ SUSP
120.0000 mg | Freq: Once | INTRAMUSCULAR | Status: AC
  Administered 2014-08-24: 120 mg via INTRAMUSCULAR

## 2014-08-24 NOTE — Assessment & Plan Note (Addendum)
With severe pruritis not doing well with atarax as prescribed, only few lesions now, but with angioedema like rxn to RLE as well,  for depomedrol IM, predpac asd,  Consider allergy referral

## 2014-08-24 NOTE — Progress Notes (Signed)
Pre visit review using our clinic review tool, if applicable. No additional management support is needed unless otherwise documented below in the visit note. 

## 2014-08-24 NOTE — Patient Instructions (Signed)
You had the steroid shot today  Please take all new medication as prescribed  - the prednisone  Please call for allergy referral if not improved in 1-2 weeks overall  Please continue all other medications as before, and refills have been done if requested.  Please have the pharmacy call with any other refills you may need.  Please keep your appointments with your specialists as you may have planned

## 2014-08-24 NOTE — Progress Notes (Signed)
Subjective:    Patient ID: [name], female    DOB: 03/04/1961, 53 y.o.   MRN: [id number]  HPI  Here with 3 days onset erythem itchy rash with intense itching all over, and welts that come and go, currently with mult areas redness to neck, upper chest, both arms and RLE , with swelling nontender to distal RLE to mid leg.  Pt denies fever, wt loss, night sweats, loss of appetite, or other constitutional symptoms  No obvious allergen such as peanut or seafood, or current med. No prior hx of same. No lip, tongue swelling, or wheezing/sob.  Pt denies polydipsia, polyuria, Pt denies chest pain, increased sob or doe, wheezing, orthopnea, PND, increased LE swelling, palpitations, dizziness or syncope.   Past Medical History  Diagnosis Date  . Asthma   . Anxiety   . COPD (chronic obstructive pulmonary disease)   . Angina   . Depression   . Hypertension   . GERD (gastroesophageal reflux disease)   . Arthritis   . Peripheral vascular disease   . Migraine   . COPD 08/24/2009    Qualifier: Diagnosis of  By: Burnett Kanaris    . Prediabetes 02/23/2014  . HYPERTENSION 05/12/2009    Qualifier: Diagnosis of  By: Lenn Cal Deborra Medina), Wynona Canes    . 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     . ALLERGIC RHINITIS 05/12/2009    Qualifier: Diagnosis of  By: Lenn Cal Deborra Medina), Wynona Canes    . Fibromyalgia 05/14/2014   Past Surgical History  Procedure Laterality Date  . Neck surgery    . Toe surgery    . Abdominal hysterectomy    . Tubal ligation    . Kidney stone surgery    . Hemorrhoid surgery    . Incise and drain abcess    . Back surgery    . Diagnostic laparoscopy    . Colonoscopy  12/20/2011    Procedure: COLONOSCOPY;  Surgeon: Landry Dyke, MD;  Location: [facility];  Service: Endoscopy;  Laterality: N/A;  . Colonoscopy   03/05/2012    Procedure: COLONOSCOPY;  Surgeon: Landry Dyke, MD;  Location: [facility];  Service: Endoscopy;  Laterality: N/A;    reports that she quit smoking about 3 years ago. She has never used smokeless tobacco. She reports that she does not drink alcohol or use illicit drugs. family history includes Anesthesia problems in her father; COPD in her mother; Cancer in her father; Diabetes in her brother, father, mother, sister, and sister; Heart disease in her brother, mother, and sister; Hypertension in her sister; Kidney disease in her father; Sleep apnea in her brother. Allergies  Allergen Reactions  . Sulfa Antibiotics Nausea And Vomiting  . Sulfonamide Derivatives Nausea And Vomiting    REACTION: n/v   Current Outpatient Prescriptions on File Prior to Visit  Medication Sig Dispense Refill  . albuterol (PROVENTIL HFA;VENTOLIN HFA) 108 (90 BASE) MCG/ACT inhaler Inhale 1-2 puffs into the lungs every 6 (six) hours as needed for wheezing.  1 Inhaler  0  . albuterol (PROVENTIL) (5 MG/ML) 0.5% nebulizer solution Take 0.5 mLs (2.5 mg total) by nebulization at bedtime as needed. As needed for shortness of breath  20 mL  3  . aspirin 325 MG EC tablet Take 1 tablet (325 mg total) by mouth daily.  30 tablet  3  .  atenolol (TENORMIN) 50 MG tablet Take 1 tablet (50 mg total) by mouth daily.  30 tablet  4  . cyclobenzaprine (FLEXERIL) 5 MG tablet Take 1 tablet (5 mg total) by mouth 3 (three) times daily as needed for muscle spasms.  30 tablet  0  . ergocalciferol (VITAMIN D2) 50000 UNITS capsule Take 1 capsule (50,000 Units total) by mouth once a week.  8 capsule  3  . FLUoxetine (PROZAC) 20 MG capsule Take 1 capsule (20 mg total) by mouth daily.  90 capsule  3  . fluticasone (FLONASE) 50 MCG/ACT nasal spray 2 sprays each nostril 1-2 times daily  16 g  3  . hydrochlorothiazide (HYDRODIURIL) 25 MG tablet Take 1 tablet (25 mg total) by mouth daily.  30 tablet  4  . hydrOXYzine (ATARAX/VISTARIL) 25  MG tablet Take 1 tablet (25 mg total) by mouth 3 (three) times daily as needed.  30 tablet  0  . ketotifen (ZADITOR) 0.025 % ophthalmic solution Place 1 drop into both eyes every 12 (twelve) hours as needed (eyes itching). Can use 2 drops both eyes in 24 hour period.  10 mL  0  . meloxicam (MOBIC) 15 MG tablet Take 1 tablet (15 mg total) by mouth daily.  30 tablet  4  . mometasone-formoterol (DULERA) 200-5 MCG/ACT AERO Inhale 2 puffs into the lungs 2 (two) times daily.  1 Inhaler  3  . NONFORMULARY OR COMPOUNDED ITEM Estradiol 0.02 % 70ml prefilled applicator Sig: apply twice a week  24 each  4  . nystatin (MYCOSTATIN/NYSTOP) 100000 UNIT/GM POWD Apply to area three times daily for 7 days  30 g  0  . omeprazole (PRILOSEC) 20 MG capsule Take 1 capsule (20 mg total) by mouth daily.  30 capsule  3  . tiotropium (SPIRIVA HANDIHALER) 18 MCG inhalation capsule Place 18 mcg into inhaler and inhale daily.      . traMADol (ULTRAM) 50 MG tablet Take 1 tablet (50 mg total) by mouth every 6 (six) hours as needed. For pain.  30 tablet  3   No current facility-administered medications on file prior to visit.   Review of Systems All otherwise neg per pt     Objective:   Physical Exam BP 138/92  Pulse 66  Temp(Src) 98.2 F (36.8 C) (Oral)  Wt 164 lb (74.39 kg)  SpO2 97% N/A VS noted,  Constitutional: Pt appears well-developed, well-nourished.  HENT: Head: NCAT.  Right Ear: External ear normal.  Left Ear: External ear normal.  Eyes: . Pupils are equal, round, and reactive to light. Conjunctivae and EOM are normal Neck: Normal range of motion. Neck supple.  Cardiovascular: Normal rate and regular rhythm.   Pulmonary/Chest: Effort normal and breath sounds normal.  - no rales or wheezing Neurological: Pt is alert. Not confused , motor grossly intact Skin: with numerous wheal and flare, some plaquelike erythem NT to arms, also angioedema like swelling to RLE trace to 1+ distal to mid leg, calf NT, neg  homans sign Psychiatric: Pt behavior is normal. No agitation.     Assessment & Plan:

## 2014-08-29 NOTE — Assessment & Plan Note (Signed)
stable overall by history and exam, recent data reviewed with pt, and pt to continue medical treatment as before,  to f/u any worsening symptoms or concerns BP Readings from Last 3 Encounters:  08/24/14 138/92  08/07/14 132/78  07/12/14 124/80

## 2014-08-29 NOTE — Assessment & Plan Note (Signed)
stable overall by history and exam, recent data reviewed with pt, and pt to continue medical treatment as before,  to f/u any worsening symptoms or concerns SpO2 Readings from Last 3 Encounters:  08/24/14 97%  08/07/14 98%  07/09/14 96%

## 2014-08-29 NOTE — Assessment & Plan Note (Signed)
stable overall by history and exam, recent data reviewed with pt, and pt to continue medical treatment as before,  to f/u any worsening symptoms or concerns Lab Results  Component Value Date   HGBA1C 5.5 12/15/2013   Pt to call for onset polys or cbg > 200 on steroid tx

## 2014-09-28 ENCOUNTER — Encounter: Payer: Self-pay | Admitting: Adult Health

## 2014-09-28 ENCOUNTER — Ambulatory Visit (INDEPENDENT_AMBULATORY_CARE_PROVIDER_SITE_OTHER): Payer: Medicare Other | Admitting: Adult Health

## 2014-09-28 VITALS — BP 138/90 | HR 75 | Temp 97.8°F | Ht 63.0 in | Wt 165.2 lb

## 2014-09-28 DIAGNOSIS — J4541 Moderate persistent asthma with (acute) exacerbation: Secondary | ICD-10-CM | POA: Diagnosis not present

## 2014-09-28 MED ORDER — LEVALBUTEROL HCL 0.63 MG/3ML IN NEBU
0.6300 mg | INHALATION_SOLUTION | Freq: Once | RESPIRATORY_TRACT | Status: AC
Start: 2014-09-28 — End: 2014-09-28
  Administered 2014-09-28: 0.63 mg via RESPIRATORY_TRACT

## 2014-09-28 MED ORDER — AMOXICILLIN-POT CLAVULANATE 875-125 MG PO TABS: 1.0000 | ORAL_TABLET | Freq: Two times a day (BID) | ORAL | Status: AC

## 2014-09-28 MED ORDER — PREDNISONE 10 MG PO TABS: ORAL_TABLET | ORAL | Status: DC

## 2014-09-28 NOTE — Assessment & Plan Note (Addendum)
Exacerbation with URI  xopenex neb x1   Plan  Augmentin 875 mg twice daily, for 7 days, take with food. Mucinex DM twice daily as needed. For cough and congestion. Fluids and rest. Prednisone taper over next week.  Claritin 10mg  At bedtime  As needed  Drainage Must quit smoking ,  Follow up Dr. Lamonte Sakai in 6 weeks and as needed. Please contact office for sooner follow up if symptoms do not improve or worsen or seek emergency care

## 2014-09-28 NOTE — Progress Notes (Signed)
53 yo former smoker with hx HTN, migraine HA, asthma dx in 2000 after a hospitalization for dyspnea. No asthma as a child. Had been managed on Advair 250 (ran out), now on Dulera + SABA since hosp. She was hospitalized 1/7 - 1/13 for an acute exacerbation.  She has been doing fairly well, is having some stabbing pains with exertion (prior cards eval was reassuring). She had gone back to smoking since last Summer, just quit again when hospitalized. She is weaning pred now. Uses albuterol every day at least once. She is planning to restart exercise routine. She is taking benadryl for itching. She is having a lot of nasal gtt and UA irritation.    Acute OV 05/28/13 -- she has been having more problems with congestion, more problems with wheeze, cough, dyspnea. She was on Dulera, nasonex - hasn't been able to take in months because she lost her job.   ROV 08/07/13 -- f/u COPD, rhinitis, cough. Was seen since last visit by Dr Melvyn Novas with wheeze, sputum, tightness. Was not taking her BD's - couldn;t afford. Was treated for sinusitis with augmentin and pred.  Also changed to bystolic. 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 Using dulera bid, albuterol 2-3 x a day.   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 She had stopped smoking for 2 months, has started back now about 1/2 pk a day. She is on [place] - feels that she benefited from the addition of spiriva. She was just treated with prednisone for an AE with good resolution > she believes it was house full of smoke that triggered her. Uses albuterol every day. Minimal cough or wheeze.   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 She has been doing fairly well, although she just moved to an apartment with 3 flights of stairs. She quit smoking June 2. She is having some insomnia and has been eating a lot. Her cough has been stable. She was just treated for  otitis media. She restarted flonase.   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 I treated her for an AE 6/26 (azithro + pred) > her wheeze improved but she is dealing with fatigue, dizziness, weakness when walking. She has seen some vaginal bleeding x 1 (even though has had a hysterectomy). She has noted bruising on her arms and her thighs since this last weekend.   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 Complains of  Patient complains of a one-week history of cough, congestion, and wheezing. She's been using Mucinex and cold medicines over-the-counter without much relief. Patient denies any hemoptysis, orthopnea, PND, nausea, vomiting, or leg swelling. She denies any recent antibiotics,  Emergency room visits or hospitalizations. Says that she is coughing up thick mucus that is hard to get up. She complains of wheezing that is worse over the last couple days.     ROS:  Constitutional:   No  weight loss, night sweats,  Fevers, chills, fatigue, or  lassitude.  HEENT:   No headaches,  Difficulty swallowing,  Tooth/dental problems, or  Sore throat,                No sneezing, itching, ear ache,  +nasal congestion, post nasal drip,   CV:  No chest pain,  Orthopnea, PND, swelling in lower extremities, anasarca, dizziness, palpitations,  syncope.   GI  No heartburn, indigestion, abdominal pain, nausea, vomiting, diarrhea, change in bowel habits, loss of appetite, bloody stools.   Resp:    No chest wall deformity  Skin: no rash or lesions.  GU: no dysuria, change in color of urine, no urgency or frequency.  No flank pain, no hematuria   MS:  No joint pain or swelling.  No decreased range of motion.  No back pain.  Psych:  No change in mood or affect. No depression or anxiety.  No memory loss.        EXAM :  Gen: Pleasant, well-nourished, in no distress,  normal affect  ENT: No lesions,  mouth clear,  oropharynx clear, very  congested, hoarse voice  Neck: No JVD, no TMG, no carotid bruits  Lungs: No use of accessory muscles,  No stridor, speaks in full sentences,  Few exp wheezing  Cardiovascular: RRR, heart sounds normal, no murmur or gallops, no peripheral edema  Musculoskeletal: No deformities, no cyanosis or clubbing  Neuro: alert, non focal  Skin: Warm, no lesions or rashes

## 2014-09-28 NOTE — Patient Instructions (Addendum)
Augmentin 875 mg twice daily, for 7 days, take with food. Mucinex DM twice daily as needed. For cough and congestion. Fluids and rest. Prednisone taper over next week.  Claritin 10mg  At bedtime  As needed  Drainage Must quit smoking ,  Follow up Dr. Lamonte Sakai in 6 weeks and as needed. Please contact office for sooner follow up if symptoms do not improve or worsen or seek emergency care

## 2014-09-28 NOTE — Addendum Note (Signed)
Addended by: Virl Cagey on: 09/28/2014 03:18 PM   Modules accepted: Orders

## 2014-10-18 ENCOUNTER — Ambulatory Visit (INDEPENDENT_AMBULATORY_CARE_PROVIDER_SITE_OTHER): Payer: Medicare Other | Admitting: *Deleted

## 2014-10-18 ENCOUNTER — Encounter: Payer: Self-pay | Admitting: Emergency Medicine

## 2014-10-18 ENCOUNTER — Ambulatory Visit (INDEPENDENT_AMBULATORY_CARE_PROVIDER_SITE_OTHER): Payer: Medicare Other | Admitting: Emergency Medicine

## 2014-10-18 VITALS — BP 158/102 | HR 61 | Temp 98.5°F | Ht 63.0 in | Wt 171.4 lb

## 2014-10-18 DIAGNOSIS — Z23 Encounter for immunization: Secondary | ICD-10-CM

## 2014-10-18 DIAGNOSIS — J449 Chronic obstructive pulmonary disease, unspecified: Secondary | ICD-10-CM

## 2014-10-18 DIAGNOSIS — I10 Essential (primary) hypertension: Secondary | ICD-10-CM

## 2014-10-18 DIAGNOSIS — J309 Allergic rhinitis, unspecified: Secondary | ICD-10-CM | POA: Diagnosis not present

## 2014-10-18 MED ORDER — LORATADINE 10 MG PO TABS: 10.0000 mg | ORAL_TABLET | Freq: Every day | ORAL | Status: DC

## 2014-10-18 NOTE — Assessment & Plan Note (Signed)
We will continue same regimen.  Restart loratdine.

## 2014-10-18 NOTE — Patient Instructions (Signed)
Please continue your inhaled medications as you have been taking them Work hard on decreasing her cigarettes. At your next visit we will plan to set a quit date Restart loratadine (Claritin) 10 mg daily Follow with Dr Lamonte Sakai in 4 months or sooner if you have any problems.

## 2014-10-18 NOTE — Assessment & Plan Note (Signed)
Hypertensive today 158/102. She was formerly on antihypertensives but not currently. We will ask her to see Dr. Jenny Reichmann to adjust her medication.

## 2014-10-18 NOTE — Progress Notes (Signed)
53 yo former smoker with hx HTN, migraine HA, asthma dx in 2000 after a hospitalization for dyspnea. No asthma as a child. Had been managed on Advair 250 (ran out), now on Dulera + SABA since hosp. She was hospitalized 1/7 - 1/13 for an acute exacerbation.  She has been doing fairly well, is having some stabbing pains with exertion (prior cards eval was reassuring). She had gone back to smoking since last Summer, just quit again when hospitalized. She is weaning pred now. Uses albuterol every day at least once. She is planning to restart exercise routine. She is taking benadryl for itching. She is having a lot of nasal gtt and UA irritation.    Acute OV 05/28/13 -- she has been having more problems with congestion, more problems with wheeze, cough, dyspnea. She was on Dulera, nasonex - hasn't been able to take in months because she lost her job.   ROV 08/07/13 -- f/u COPD, rhinitis, cough. Was seen since last visit by Dr Melvyn Novas with wheeze, sputum, tightness. Was not taking her BD's - couldn;t afford. Was treated for sinusitis with augmentin and pred.  Also changed to bystolic. 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 Using dulera bid, albuterol 2-3 x a day.   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 She had stopped smoking for 2 months, has started back now about 1/2 pk a day. She is on [place] - feels that she benefited from the addition of spiriva. She was just treated with prednisone for an AE with good resolution > she believes it was house full of smoke that triggered her. Uses albuterol every day. Minimal cough or wheeze.   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 She has been doing fairly well, although she just moved to an apartment with 3 flights of stairs. She quit smoking June 2. She is having some insomnia and has been eating a lot. Her cough has been stable. She was just treated for  otitis media. She restarted flonase.   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 I treated her for an AE 6/26 (azithro + pred) > her wheeze improved but she is dealing with fatigue, dizziness, weakness when walking. She has seen some vaginal bleeding x 1 (even though has had a hysterectomy). She has noted bruising on her arms and her thighs since this last weekend.   ROV 10/18/14 -- follow up for severe AFL, COPD with a recent AE treated by TP. Returns for f/u. She is improved but states that her energy is low.  She is still smoking a little - every other day. She is on spiriva and dulera. She uses albuterol every day.   Filed Vitals:   10/18/14 1515  BP: 158/102  Pulse: 61  Temp: 98.5 F (36.9 C)  TempSrc: Oral  Height: 5\' 3"  (1.6 m)  Weight: 171 lb 6.4 oz (77.747 kg)  SpO2: 99%   Gen: Pleasant, well-nourished, in no distress,  normal affect  ENT: No lesions,  mouth clear,  oropharynx clear, very congested, hoarse voice  Neck: No JVD, no TMG, no carotid bruits  Lungs: No use of accessory muscles,  Very distant but no wheezing  Cardiovascular: RRR, heart sounds normal, no murmur or gallops, no peripheral edema  Musculoskeletal: No deformities, no cyanosis or clubbing  Neuro: alert, non focal  Skin: Warm, no  lesions or rashes  HTN (hypertension) Hypertensive today 158/102. She was formerly on antihypertensives but not currently. We will ask her to see Dr. Jenny Reichmann to adjust her medication.  COPD (chronic obstructive pulmonary disease) We will continue same regimen.  Restart loratdine.

## 2014-11-02 ENCOUNTER — Encounter: Payer: Self-pay | Admitting: Gynecology

## 2014-11-02 ENCOUNTER — Ambulatory Visit (INDEPENDENT_AMBULATORY_CARE_PROVIDER_SITE_OTHER): Payer: Medicare Other | Admitting: Gynecology

## 2014-11-02 VITALS — BP 136/88

## 2014-11-02 DIAGNOSIS — R350 Frequency of micturition: Secondary | ICD-10-CM | POA: Diagnosis not present

## 2014-11-02 DIAGNOSIS — R319 Hematuria, unspecified: Secondary | ICD-10-CM

## 2014-11-02 DIAGNOSIS — Z87442 Personal history of urinary calculi: Secondary | ICD-10-CM | POA: Diagnosis not present

## 2014-11-02 LAB — URINALYSIS W MICROSCOPIC + REFLEX CULTURE
Bilirubin Urine: NEGATIVE
Casts: NONE SEEN
Crystals: NONE SEEN
Glucose, UA: NEGATIVE mg/dL
Ketones, ur: NEGATIVE mg/dL
Leukocytes, UA: NEGATIVE
Nitrite: NEGATIVE
Protein, ur: NEGATIVE mg/dL
Specific Gravity, Urine: 1.02 (ref 1.005–1.030)
Urobilinogen, UA: 0.2 mg/dL (ref 0.0–1.0)
WBC, UA: NONE SEEN WBC/hpf (ref <3)
pH: 5 (ref 5.0–8.0)

## 2014-11-02 MED ORDER — PHENAZOPYRIDINE HCL 200 MG PO TABS: 200.0000 mg | ORAL_TABLET | Freq: Three times a day (TID) | ORAL | Status: DC | PRN

## 2014-11-02 NOTE — Patient Instructions (Signed)
Hematuria °Hematuria is blood in your urine. It can be caused by a bladder infection, kidney infection, prostate infection, kidney stone, or cancer of your urinary tract. Infections can usually be treated with medicine, and a kidney stone usually will pass through your urine. If neither of these is the cause of your hematuria, further workup to find out the reason may be needed. °It is very important that you tell your health care provider about any blood you see in your urine, even if the blood stops without treatment or happens without causing pain. Blood in your urine that happens and then stops and then happens again can be a symptom of a very serious condition. Also, pain is not a symptom in the initial stages of many urinary cancers. °HOME CARE INSTRUCTIONS  °· Drink lots of fluid, 3-4 quarts a day. If you have been diagnosed with an infection, cranberry juice is especially recommended, in addition to large amounts of water. °· Avoid caffeine, tea, and carbonated beverages because they tend to irritate the bladder. °· Avoid alcohol because it may irritate the prostate. °· Take all medicines as directed by your health care provider. °· If you were prescribed an antibiotic medicine, finish it all even if you start to feel better. °· If you have been diagnosed with a kidney stone, follow your health care provider's instructions regarding straining your urine to catch the stone. °· Empty your bladder often. Avoid holding urine for long periods of time. °· After a bowel movement, women should cleanse front to back. Use each tissue only once. °· Empty your bladder before and after sexual intercourse if you are a female. °SEEK MEDICAL CARE IF: °· You develop back pain. °· You have a fever. °· You have a feeling of sickness in your stomach (nausea) or vomiting. °· Your symptoms are not better in 3 days. Return sooner if you are getting worse. °SEEK IMMEDIATE MEDICAL CARE IF:  °· You develop severe vomiting and are  unable to keep the medicine down. °· You develop severe back or abdominal pain despite taking your medicines. °· You begin passing a large amount of blood or clots in your urine. °· You feel extremely weak or faint, or you pass out. °MAKE SURE YOU:  °· Understand these instructions. °· Will watch your condition. °· Will get help right away if you are not doing well or get worse. °Document Released: 12/03/2005 Document Revised: 04/19/2014 Document Reviewed: 08/03/2013 °ExitCare® Patient Information ©2015 [employer]. This information is not intended to replace advice given to you by your health care provider. Make sure you discuss any questions you have with your health care provider. ° °Kidney Stones °Kidney stones (urolithiasis) are deposits that form inside your kidneys. The intense pain is caused by the stone moving through the urinary tract. When the stone moves, the ureter goes into spasm around the stone. The stone is usually passed in the urine.  °CAUSES  °· A disorder that makes certain neck glands produce too much parathyroid hormone (primary hyperparathyroidism). °· A buildup of uric acid crystals, similar to gout in your joints. °· Narrowing (stricture) of the ureter. °· A kidney obstruction present at birth (congenital obstruction). °· Previous surgery on the kidney or ureters. °· Numerous kidney infections. °SYMPTOMS  °· Feeling sick to your stomach (nauseous). °· Throwing up (vomiting). °· Blood in the urine (hematuria). °· Pain that usually spreads (radiates) to the groin. °· Frequency or urgency of urination. °DIAGNOSIS  °· Taking a history and   physical exam. °· Blood or urine tests. °· CT scan. °· Occasionally, an examination of the inside of the urinary bladder (cystoscopy) is performed. °TREATMENT  °· Observation. °· Increasing your fluid intake. °· Extracorporeal shock wave lithotripsy--This is a noninvasive procedure that uses shock waves to break up kidney stones. °· Surgery may be needed if  you have severe pain or persistent obstruction. There are various surgical procedures. Most of the procedures are performed with the use of small instruments. Only small incisions are needed to accommodate these instruments, so recovery time is minimized. °The size, location, and chemical composition are all important variables that will determine the proper choice of action for you. Talk to your health care provider to better understand your situation so that you will minimize the risk of injury to yourself and your kidney.  °HOME CARE INSTRUCTIONS  °· Drink enough water and fluids to keep your urine clear or pale yellow. This will help you to pass the stone or stone fragments. °· Strain all urine through the provided strainer. Keep all particulate matter and stones for your health care provider to see. The stone causing the pain may be as small as a grain of salt. It is very important to use the strainer each and every time you pass your urine. The collection of your stone will allow your health care provider to analyze it and verify that a stone has actually passed. The stone analysis will often identify what you can do to reduce the incidence of recurrences. °· Only take over-the-counter or prescription medicines for pain, discomfort, or fever as directed by your health care provider. °· Make a follow-up appointment with your health care provider as directed. °· Get follow-up X-rays if required. The absence of pain does not always mean that the stone has passed. It may have only stopped moving. If the urine remains completely obstructed, it can cause loss of kidney function or even complete destruction of the kidney. It is your responsibility to make sure X-rays and follow-ups are completed. Ultrasounds of the kidney can show blockages and the status of the kidney. Ultrasounds are not associated with any radiation and can be performed easily in a matter of minutes. °SEEK MEDICAL CARE IF: °· You experience pain  that is progressive and unresponsive to any pain medicine you have been prescribed. °SEEK IMMEDIATE MEDICAL CARE IF:  °· Pain cannot be controlled with the prescribed medicine. °· You have a fever or shaking chills. °· The severity or intensity of pain increases over 18 hours and is not relieved by pain medicine. °· You develop a new onset of abdominal pain. °· You feel faint or pass out. °· You are unable to urinate. °MAKE SURE YOU:  °· Understand these instructions. °· Will watch your condition. °· Will get help right away if you are not doing well or get worse. °Document Released: 12/03/2005 Document Revised: 08/05/2013 Document Reviewed: 05/06/2013 °ExitCare® Patient Information ©2015 [employer]. This information is not intended to replace advice given to you by your health care provider. Make sure you discuss any questions you have with your health care provider. ° °

## 2014-11-02 NOTE — Progress Notes (Signed)
   Patient presented to the office stating that approximately 2 weeks ago she noted blood when she was urinating. She's had frequency as well as nocturia but reports no back pain, no fever, no chills, no nausea, no vomiting. She states that it cleared up on its on but she has some vague lower abdominal discomfort. She has a past history of vaginal hysterectomy. She has informed me the greater than 10 years ago she had history of left nephrolithiasis and had surgical intervention to retrieve the kidney stone as well. She was in no acute distress today.  Exam: Back: No CVA tenderness Abdomen: Soft nontender no rebound or guarding Pelvic: Bartholin urethra Skene glands within normal limits Vagina: No lesions or discharge Bimanual exam no rebound or guarding Rectal exam not done  Urinalysis rare bacteria moderate blood (3-6) white blood cells none seen  Assessment/plan: Patient's symptomatology may be attributed to perhaps having passed a kidney stone several weeks ago. There is still some blood in her urine. We will run a urine culture. If her urine culture is negative and she still has vague low abdominal discomfort we'll proceed with doing an ultrasound. We'll also recheck her urine again if microscopic hematuria is present she'll be referred to the urologist. In the meantime she will be prescribed Pyridium 200 mg one by mouth 3 times a day for 3 days as we await the results of the culture.

## 2014-11-03 ENCOUNTER — Encounter: Payer: Self-pay | Admitting: Internal Medicine

## 2014-11-03 ENCOUNTER — Ambulatory Visit (INDEPENDENT_AMBULATORY_CARE_PROVIDER_SITE_OTHER): Payer: Medicare Other | Admitting: Internal Medicine

## 2014-11-03 VITALS — BP 160/98 | HR 83 | Temp 98.7°F | Ht 63.0 in | Wt 163.2 lb

## 2014-11-03 DIAGNOSIS — I10 Essential (primary) hypertension: Secondary | ICD-10-CM | POA: Diagnosis not present

## 2014-11-03 DIAGNOSIS — J019 Acute sinusitis, unspecified: Secondary | ICD-10-CM | POA: Insufficient documentation

## 2014-11-03 DIAGNOSIS — J438 Other emphysema: Secondary | ICD-10-CM | POA: Diagnosis not present

## 2014-11-03 DIAGNOSIS — J3089 Other allergic rhinitis: Secondary | ICD-10-CM

## 2014-11-03 DIAGNOSIS — J018 Other acute sinusitis: Secondary | ICD-10-CM | POA: Diagnosis not present

## 2014-11-03 MED ORDER — METHYLPREDNISOLONE ACETATE 80 MG/ML IJ SUSP
80.0000 mg | Freq: Once | INTRAMUSCULAR | Status: AC
  Administered 2014-11-03: 80 mg via INTRAMUSCULAR

## 2014-11-03 MED ORDER — LEVOFLOXACIN 250 MG PO TABS: 250.0000 mg | ORAL_TABLET | Freq: Every day | ORAL | Status: DC

## 2014-11-03 MED ORDER — CETIRIZINE HCL 10 MG PO TABS: 10.0000 mg | ORAL_TABLET | Freq: Every day | ORAL | Status: DC

## 2014-11-03 MED ORDER — IRBESARTAN-HYDROCHLOROTHIAZIDE 150-12.5 MG PO TABS: 1.0000 | ORAL_TABLET | Freq: Every day | ORAL | Status: DC

## 2014-11-03 NOTE — Assessment & Plan Note (Signed)
For med re-start,  BP Readings from Last 3 Encounters:  11/03/14 160/98  11/02/14 136/88  10/18/14 158/102    to f/u any worsening symptoms or concerns

## 2014-11-03 NOTE — Assessment & Plan Note (Signed)
stable overall by history and exam, recent data reviewed with pt, and pt to continue medical treatment as before,  to f/u any worsening symptoms or concerns SpO2 Readings from Last 3 Encounters:  11/03/14 93%  10/18/14 99%  09/28/14 97%

## 2014-11-03 NOTE — Assessment & Plan Note (Signed)
Mild to mod, for antibx course,  to f/u any worsening symptoms or concerns 

## 2014-11-03 NOTE — Assessment & Plan Note (Signed)
Mild to mod, for depomedrol IM, zyrtec prn,  to f/u any worsening symptoms or concerns

## 2014-11-03 NOTE — Progress Notes (Signed)
Pre visit review using our clinic review tool, if applicable. No additional management support is needed unless otherwise documented below in the visit note. 

## 2014-11-03 NOTE — Patient Instructions (Signed)
You had the steroid shot today  Please take all new medication as prescribed - the antibiotic for infection, and zyrtec for allergies, and the blood pressure medication  Please continue all other medications as before, and refills have been done if requested.  Please have the pharmacy call with any other refills you may need.  Please continue your efforts at being more active, low cholesterol diet, and weight control.  Please keep your appointments with your specialists as you may have planned  Please return in 1 months, or sooner if needed

## 2014-11-03 NOTE — Progress Notes (Signed)
Subjective:    Patient ID: [name], female    DOB: Mar 11, 1961, 53 y.o.   MRN: [id number]  HPI  Here with 2-3 days acute onset fever, facial pain, pressure, headache, general weakness and malaise, and greenish d/c, with mild ST and cough, but pt denies chest pain, wheezing, increased sob or doe, orthopnea, PND, increased LE swelling, palpitations, dizziness or syncope. Does have several wks ongoing nasal allergy symptoms with clearish congestion, itch and sneezing, without fever, pain, ST, cough, swelling or wheezing.  BP has been elevated recently, ran out of meds.  Pt denies new neurological symptoms such as new headache, or facial or extremity weakness or numbness Unfortunately back to smoking, but wants to quit, wants to f/u with Dr Lamonte Sakai Past Medical History  Diagnosis Date  . Asthma   . Anxiety   . COPD (chronic obstructive pulmonary disease)   . Angina   . Depression   . Hypertension   . GERD (gastroesophageal reflux disease)   . Arthritis   . Peripheral vascular disease   . Migraine   . COPD 08/24/2009    Qualifier: Diagnosis of  By: Burnett Kanaris    . Prediabetes 02/23/2014  . HYPERTENSION 05/12/2009    Qualifier: Diagnosis of  By: Lenn Cal Deborra Medina), Wynona Canes    . 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     . ALLERGIC RHINITIS 05/12/2009    Qualifier: Diagnosis of  By: Lenn Cal Deborra Medina), Wynona Canes    . Fibromyalgia 05/14/2014   Past Surgical History  Procedure Laterality Date  . Neck surgery    . Toe surgery    . Abdominal hysterectomy    . Tubal ligation    . Kidney stone surgery    . Hemorrhoid surgery    . Incise and drain abcess    . Back surgery    . Diagnostic laparoscopy    . Colonoscopy  12/20/2011    Procedure: COLONOSCOPY;  Surgeon: Landry Dyke, MD;  Location: [facility];  Service:  Endoscopy;  Laterality: N/A;  . Colonoscopy  03/05/2012    Procedure: COLONOSCOPY;  Surgeon: Landry Dyke, MD;  Location: [facility];  Service: Endoscopy;  Laterality: N/A;    reports that she has been smoking.  She has never used smokeless tobacco. She reports that she does not drink alcohol or use illicit drugs. family history includes Anesthesia problems in her father; COPD in her mother; Cancer in her father; Diabetes in her brother, father, mother, sister, and sister; Heart disease in her brother, mother, and sister; Hypertension in her sister; Kidney disease in her father; Sleep apnea in her brother. Allergies  Allergen Reactions  . Sulfa Antibiotics Nausea And Vomiting  . Sulfonamide Derivatives Nausea And Vomiting    REACTION: n/v   Current Outpatient Prescriptions on File Prior to Visit  Medication Sig Dispense Refill  . albuterol (PROVENTIL HFA;VENTOLIN HFA) 108 (90 BASE) MCG/ACT inhaler Inhale 1-2 puffs into the lungs every 6 (six) hours as needed for wheezing. 1 Inhaler 0  . albuterol (PROVENTIL) (5 MG/ML) 0.5% nebulizer solution Take 0.5 mLs (2.5 mg total) by nebulization at bedtime as needed. As needed for shortness of breath 20 mL 3  . cyclobenzaprine (FLEXERIL) 5 MG tablet Take 1 tablet (5 mg total) by mouth 3 (three) times daily as needed for muscle spasms. 30 tablet 0  .  FLUoxetine (PROZAC) 20 MG capsule Take 1 capsule (20 mg total) by mouth daily. 90 capsule 3  . hydrOXYzine (ATARAX/VISTARIL) 25 MG tablet Take 1 tablet (25 mg total) by mouth 3 (three) times daily as needed. 30 tablet 0  . loratadine (CLARITIN) 10 MG tablet Take 1 tablet (10 mg total) by mouth daily. 30 tablet 11  . mometasone-formoterol (DULERA) 200-5 MCG/ACT AERO Inhale 2 puffs into the lungs 2 (two) times daily. 1 Inhaler 3  . NONFORMULARY OR COMPOUNDED ITEM Estradiol 0.02 % 84ml prefilled applicator Sig: apply twice a week 24 each 4  . omeprazole (PRILOSEC) 20 MG capsule Take 1 capsule (20 mg total)  by mouth daily. 30 capsule 3  . phenazopyridine (PYRIDIUM) 200 MG tablet Take 1 tablet (200 mg total) by mouth 3 (three) times daily as needed for pain. 9 tablet 0  . tiotropium (SPIRIVA HANDIHALER) 18 MCG inhalation capsule Place 18 mcg into inhaler and inhale daily.    . traMADol (ULTRAM) 50 MG tablet Take 1 tablet (50 mg total) by mouth every 6 (six) hours as needed. For pain. 30 tablet 3   No current facility-administered medications on file prior to visit.   Review of Systems  Constitutional: Negative for unusual diaphoresis or other sweats  HENT: Negative for ringing in ear Eyes: Negative for double vision or worsening visual disturbance.  Respiratory: Negative for choking and stridor.   Gastrointestinal: Negative for vomiting or other signifcant bowel change Genitourinary: Negative for hematuria or decreased urine volume.  Musculoskeletal: Negative for other MSK pain or swelling Skin: Negative for color change and worsening wound.  Neurological: Negative for tremors and numbness other than noted  Psychiatric/Behavioral: Negative for decreased concentration or agitation other than above       Objective:   Physical Exam BP 160/98 mmHg  Pulse 83  Temp(Src) 98.7 F (37.1 C) (Oral)  Ht 5\' 3"  (1.6 m)  Wt 163 lb 4 oz (74.05 kg)  BMI 28.93 kg/m2  SpO2 93% VS noted, mild ill Constitutional: Pt appears well-developed, well-nourished.  HENT: Head: NCAT.  Right Ear: External ear normal.  Left Ear: External ear normal.  Bilat tm's with mild erythema.  Max sinus areas mild tender.  Pharynx with mild erythema, no exudate Eyes: . Pupils are equal, round, and reactive to light. Conjunctivae and EOM are normal Neck: Normal range of motion. Neck supple.  Cardiovascular: Normal rate and regular rhythm.   Pulmonary/Chest: Effort normal and breath sounds decresaed bilat, no rales or wheezing Neurological: Pt is alert. Not confused , motor grossly intact Skin: Skin is warm. No  rash Psychiatric: Pt behavior is normal. No agitation.     Assessment & Plan:

## 2014-11-04 LAB — URINE CULTURE
Colony Count: NO GROWTH
Organism ID, Bacteria: NO GROWTH

## 2014-11-17 ENCOUNTER — Telehealth: Payer: Self-pay | Admitting: Emergency Medicine

## 2014-11-17 NOTE — Telephone Encounter (Signed)
Pt c/o prod cough with clear mucus worse in am and when laying down, increased sob X1 week.  Is requesting prednisone rx.  RB please advise.  Thanks!!

## 2014-11-17 NOTE — Telephone Encounter (Signed)
I am concerned about calling prednisone empirically - she has HTN that has been poorly controlled, was quite high on our last visit. I think she needs to be seen in office to check CXR and to treat her

## 2014-11-18 NOTE — Telephone Encounter (Signed)
Called and spoke with pt and she has appt on 12/4 with TP.  She is aware to come early and have cxr prior to appt.  Nothing further is needed.

## 2014-11-19 ENCOUNTER — Other Ambulatory Visit: Payer: Self-pay | Admitting: Internal Medicine

## 2014-11-19 ENCOUNTER — Ambulatory Visit (INDEPENDENT_AMBULATORY_CARE_PROVIDER_SITE_OTHER): Payer: Medicare Other | Admitting: Adult Health

## 2014-11-19 ENCOUNTER — Other Ambulatory Visit: Payer: Self-pay | Admitting: Gynecology

## 2014-11-19 ENCOUNTER — Encounter: Payer: Self-pay | Admitting: Adult Health

## 2014-11-19 VITALS — BP 114/84 | HR 61 | Temp 98.0°F | Ht 63.0 in | Wt 164.8 lb

## 2014-11-19 DIAGNOSIS — J441 Chronic obstructive pulmonary disease with (acute) exacerbation: Secondary | ICD-10-CM

## 2014-11-19 MED ORDER — ALBUTEROL SULFATE HFA 108 (90 BASE) MCG/ACT IN AERS: 1.0000 | INHALATION_SPRAY | Freq: Four times a day (QID) | RESPIRATORY_TRACT | Status: DC | PRN

## 2014-11-19 MED ORDER — PREDNISONE 10 MG PO TABS: ORAL_TABLET | ORAL | Status: DC

## 2014-11-19 NOTE — Patient Instructions (Signed)
Prednisone taper over next week.  Claritin 10mg  At bedtime  As needed  Drainage Must quit smoking ,  Follow up Dr. Lamonte Sakai in 2 months  and as needed. Please contact office for sooner follow up if symptoms do not improve or worsen or seek emergency care

## 2014-11-19 NOTE — Assessment & Plan Note (Signed)
Recurrent flare in smoker   Plan Prednisone taper over next week.  Claritin 10mg  At bedtime  As needed  Drainage Must quit smoking ,  Follow up Dr. Lamonte Sakai in 2 months  and as needed. Please contact office for sooner follow up if symptoms do not improve or worsen or seek emergency care

## 2014-11-19 NOTE — Progress Notes (Signed)
53 yo former smoker with hx HTN, migraine HA, asthma dx in 2000 after a hospitalization for dyspnea. No asthma as a child. Had been managed on Advair 250 (ran out), now on Dulera + SABA since hosp. She was hospitalized 1/7 - 1/13 for an acute exacerbation.  She has been doing fairly well, is having some stabbing pains with exertion (prior cards eval was reassuring). She had gone back to smoking since last Summer, just quit again when hospitalized. She is weaning pred now. Uses albuterol every day at least once. She is planning to restart exercise routine. She is taking benadryl for itching. She is having a lot of nasal gtt and UA irritation.    Acute OV 05/28/13 -- she has been having more problems with congestion, more problems with wheeze, cough, dyspnea. She was on Dulera, nasonex - hasn't been able to take in months because she lost her job.   ROV 08/07/13 -- f/u COPD, rhinitis, cough. Was seen since last visit by Dr Melvyn Novas with wheeze, sputum, tightness. Was not taking her BD's - couldn;t afford. Was treated for sinusitis with augmentin and pred.  Also changed to bystolic. 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 Using dulera bid, albuterol 2-3 x a day.   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 She had stopped smoking for 2 months, has started back now about 1/2 pk a day. She is on [place] - feels that she benefited from the addition of spiriva. She was just treated with prednisone for an AE with good resolution > she believes it was house full of smoke that triggered her. Uses albuterol every day. Minimal cough or wheeze.   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 She has been doing fairly well, although she just moved to an apartment with 3 flights of stairs. She quit smoking June 2. She is having some insomnia and has been eating a lot. Her cough has been stable. She was just treated for  otitis media. She restarted flonase.   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 I treated her for an AE 6/26 (azithro + pred) > her wheeze improved but she is dealing with fatigue, dizziness, weakness when walking. She has seen some vaginal bleeding x 1 (even though has had a hysterectomy). She has noted bruising on her arms and her thighs since this last weekend.   ROV 10/18/14 -- follow up for severe AFL, COPD with a recent AE treated by TP. Returns for f/u. She is improved but states that her energy is low.  She is still smoking a little - every other day. She is on spiriva and dulera. She uses albuterol every day.    11/19/2014 Acute OV  Complains of  Of  cough with clear mucus, tightness, increased SOB, fatigue x2-3 weeks.  denies f/c/s, n/v/d, hemoptysis, PND, leg swelling, chest pain, orthopnea. Patient continues to smoke. We discussed smoking cessation in detail She remains on Spiriva and Dulera She is not using any medications for treatment Recent travel or antibiotic use No emergency room or hospitalizations. Since last visit  ROS See HPI    EXAM Gen: Pleasant, well-nourished, in no distress,  normal affect  ENT: No lesions,  mouth clear,  oropharynx clear, very congested, hoarse voice  Neck: No JVD, no TMG, no carotid bruits  Lungs: No use of accessory muscles,  Distant with faint exp  wheezing  Cardiovascular: RRR, heart sounds normal, no murmur or gallops, no peripheral edema  Musculoskeletal: No deformities, no cyanosis or clubbing  Neuro: alert, non focal  Skin: Warm, no lesions or rashes

## 2014-12-01 ENCOUNTER — Telehealth: Payer: Self-pay | Admitting: Emergency Medicine

## 2014-12-01 MED ORDER — PREDNISONE 10 MG PO TABS: ORAL_TABLET | ORAL | Status: DC

## 2014-12-01 MED ORDER — DOXYCYCLINE HYCLATE 100 MG PO TABS: 100.0000 mg | ORAL_TABLET | Freq: Two times a day (BID) | ORAL | Status: DC

## 2014-12-01 NOTE — Telephone Encounter (Signed)
Called and spoke to pt. Pt c/o prod cough with white mucus, chest congestion, increase in SOB, CP when coughing, chills without fever, mild dizziness and fatigue x 1 week. Pt is taking OTC delsym and it is not helping. Pt last seen on 11/19/14 by TP.   Dr. Lamonte Sakai please advise.  Allergies  Allergen Reactions  . Sulfa Antibiotics Nausea And Vomiting  . Sulfonamide Derivatives Nausea And Vomiting    REACTION: n/v

## 2014-12-01 NOTE — Telephone Encounter (Signed)
Per RB: okay for pred taper #30 4x3, 3x3, 2x3,1x3 and stop and doxycycline 100mg  #14 BID x7days.  Continue the Delsym and add mucinex dm BID prn for cough/congestion.  Will need to be seen in office if symptoms do not improve or they worsen.  Called spoke with patient and discussed the above with her.  Pt very grateful and voiced her understanding on RB's recommendations.   Rx's sent to verified pharmacy Nothing further needed at this time; will sign off

## 2014-12-02 ENCOUNTER — Ambulatory Visit: Payer: Medicare Other | Admitting: Internal Medicine

## 2014-12-21 ENCOUNTER — Encounter: Payer: Self-pay | Admitting: Internal Medicine

## 2014-12-21 ENCOUNTER — Ambulatory Visit: Payer: Medicare Other | Admitting: Internal Medicine

## 2014-12-21 ENCOUNTER — Other Ambulatory Visit (INDEPENDENT_AMBULATORY_CARE_PROVIDER_SITE_OTHER): Payer: Medicare Other

## 2014-12-21 ENCOUNTER — Ambulatory Visit (INDEPENDENT_AMBULATORY_CARE_PROVIDER_SITE_OTHER): Payer: Medicare Other | Admitting: Internal Medicine

## 2014-12-21 VITALS — BP 122/82 | HR 68 | Temp 98.0°F | Ht 63.0 in | Wt 166.0 lb

## 2014-12-21 DIAGNOSIS — F32A Depression, unspecified: Secondary | ICD-10-CM

## 2014-12-21 DIAGNOSIS — J441 Chronic obstructive pulmonary disease with (acute) exacerbation: Secondary | ICD-10-CM

## 2014-12-21 DIAGNOSIS — I10 Essential (primary) hypertension: Secondary | ICD-10-CM | POA: Diagnosis not present

## 2014-12-21 DIAGNOSIS — R5383 Other fatigue: Secondary | ICD-10-CM

## 2014-12-21 DIAGNOSIS — F329 Major depressive disorder, single episode, unspecified: Secondary | ICD-10-CM

## 2014-12-21 LAB — CBC WITH DIFFERENTIAL/PLATELET
Basophils Absolute: 0 10*3/uL (ref 0.0–0.1)
Basophils Relative: 0.3 % (ref 0.0–3.0)
Eosinophils Absolute: 0.2 10*3/uL (ref 0.0–0.7)
Eosinophils Relative: 2.6 % (ref 0.0–5.0)
HCT: 33.5 % — ABNORMAL LOW (ref 36.0–46.0)
Hemoglobin: 10.9 g/dL — ABNORMAL LOW (ref 12.0–15.0)
Lymphocytes Relative: 33.2 % (ref 12.0–46.0)
Lymphs Abs: 3 10*3/uL (ref 0.7–4.0)
MCHC: 32.7 g/dL (ref 30.0–36.0)
MCV: 93.8 fl (ref 78.0–100.0)
Monocytes Absolute: 0.7 10*3/uL (ref 0.1–1.0)
Monocytes Relative: 8.1 % (ref 3.0–12.0)
Neutro Abs: 5.1 10*3/uL (ref 1.4–7.7)
Neutrophils Relative %: 55.8 % (ref 43.0–77.0)
Platelets: 305 10*3/uL (ref 150.0–400.0)
RBC: 3.57 Mil/uL — ABNORMAL LOW (ref 3.87–5.11)
RDW: 15.1 % (ref 11.5–15.5)
WBC: 9.1 10*3/uL (ref 4.0–10.5)

## 2014-12-21 LAB — URINALYSIS, ROUTINE W REFLEX MICROSCOPIC
Bilirubin Urine: NEGATIVE
Ketones, ur: NEGATIVE
Leukocytes, UA: NEGATIVE
Nitrite: NEGATIVE
Specific Gravity, Urine: 1.025 (ref 1.000–1.030)
Total Protein, Urine: NEGATIVE
Urine Glucose: NEGATIVE
Urobilinogen, UA: 0.2 (ref 0.0–1.0)
pH: 6 (ref 5.0–8.0)

## 2014-12-21 LAB — HEPATIC FUNCTION PANEL
ALT: 25 U/L (ref 0–35)
AST: 19 U/L (ref 0–37)
Albumin: 3.5 g/dL (ref 3.5–5.2)
Alkaline Phosphatase: 63 U/L (ref 39–117)
Bilirubin, Direct: 0 mg/dL (ref 0.0–0.3)
Total Bilirubin: 0.4 mg/dL (ref 0.2–1.2)
Total Protein: 6.6 g/dL (ref 6.0–8.3)

## 2014-12-21 LAB — BASIC METABOLIC PANEL
BUN: 9 mg/dL (ref 6–23)
CO2: 30 mEq/L (ref 19–32)
Calcium: 8.7 mg/dL (ref 8.4–10.5)
Chloride: 108 mEq/L (ref 96–112)
Creatinine, Ser: 0.7 mg/dL (ref 0.4–1.2)
GFR: 120.95 mL/min (ref >60.00)
Glucose, Bld: 81 mg/dL (ref 70–99)
Potassium: 3.8 mEq/L (ref 3.5–5.1)
Sodium: 142 mEq/L (ref 135–145)

## 2014-12-21 LAB — LIPID PANEL
Cholesterol: 162 mg/dL (ref 0–200)
HDL: 63 mg/dL (ref >39.00)
LDL Cholesterol: 90 mg/dL (ref 0–99)
NonHDL: 99
Total CHOL/HDL Ratio: 3
Triglycerides: 43 mg/dL (ref 0.0–149.0)
VLDL: 8.6 mg/dL (ref 0.0–40.0)

## 2014-12-21 LAB — TSH: TSH: 0.7 u[IU]/mL (ref 0.35–4.50)

## 2014-12-21 MED ORDER — METHYLPREDNISOLONE ACETATE 80 MG/ML IJ SUSP
80.0000 mg | Freq: Once | INTRAMUSCULAR | Status: AC
  Administered 2014-12-21: 80 mg via INTRAMUSCULAR

## 2014-12-21 MED ORDER — PREDNISONE 10 MG PO TABS: ORAL_TABLET | ORAL | Status: DC

## 2014-12-21 MED ORDER — LEVOFLOXACIN 500 MG PO TABS: 500.0000 mg | ORAL_TABLET | Freq: Every day | ORAL | Status: DC

## 2014-12-21 MED ORDER — HYDROCODONE-HOMATROPINE 5-1.5 MG/5ML PO SYRP
5.0000 mL | ORAL_SOLUTION | Freq: Four times a day (QID) | ORAL | Status: DC | PRN
Start: 2014-12-21 — End: 2015-04-05

## 2014-12-21 NOTE — Assessment & Plan Note (Signed)
stable overall by history and exam, recent data reviewed with pt, and pt to continue medical treatment as before,  to f/u any worsening symptoms or concerns BP Readings from Last 3 Encounters:  12/21/14 122/82  11/19/14 114/84  11/03/14 160/98

## 2014-12-21 NOTE — Progress Notes (Signed)
Pre visit review using our clinic review tool, if applicable. No additional management support is needed unless otherwise documented below in the visit note. 

## 2014-12-21 NOTE — Assessment & Plan Note (Signed)
Mild to mod, for depomedrol IM 80, and predpac asd,  to f/u any worsening symptoms or concerns 

## 2014-12-21 NOTE — Assessment & Plan Note (Signed)
Etiology unclear, Exam otherwise benign, to check labs and cxr as documented, follow with expectant management

## 2014-12-21 NOTE — Progress Notes (Signed)
Subjective:    Patient ID: [name], female    DOB: 1961-08-01, 54 y.o.   MRN: [id number]  HPI  Here to f/u, has ongoing cough since dec 17, has seen pulmonary.but not improved as she usually is with prednisone as prescribed.  Today with 1 wk worsening prod initially greenish (now less green) without blood, but with incrdeased tightness and wheezing, using her rescue inhaler more often due to sob/doe.  No fever she is aware and Pt denies chest pain,  orthopnea, PND, increased LE swelling, palpitations, dizziness or syncope.  Still smoking, unable to quit, otc nicotine make her sick.   Does c/o ongoing fatigue, but denies signficant daytime hypersomnolence., though has been sleeping more for unclear reasons.  Has had mild worsening depressive symptoms related to holiday season it seems to her, but no suicidal ideation, or panic.  C/o just no energy.    Past Medical History  Diagnosis Date  . Asthma   . Anxiety   . COPD (chronic obstructive pulmonary disease)   . Angina   . Depression   . Hypertension   . GERD (gastroesophageal reflux disease)   . Arthritis   . Peripheral vascular disease   . Migraine   . COPD 08/24/2009    Qualifier: Diagnosis of  By: Burnett Kanaris    . Prediabetes 02/23/2014  . HYPERTENSION 05/12/2009    Qualifier: Diagnosis of  By: Lenn Cal Deborra Medina), Wynona Canes    . 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     . ALLERGIC RHINITIS 05/12/2009    Qualifier: Diagnosis of  By: Lenn Cal Deborra Medina), Wynona Canes    . Fibromyalgia 05/14/2014   Past Surgical History  Procedure Laterality Date  . Neck surgery    . Toe surgery    . Abdominal hysterectomy    . Tubal ligation    . Kidney stone surgery    . Hemorrhoid surgery    . Incise and drain abcess    . Back surgery    . Diagnostic laparoscopy    .  Colonoscopy  12/20/2011    Procedure: COLONOSCOPY;  Surgeon: Landry Dyke, MD;  Location: [facility];  Service: Endoscopy;  Laterality: N/A;  . Colonoscopy  03/05/2012    Procedure: COLONOSCOPY;  Surgeon: Landry Dyke, MD;  Location: [facility];  Service: Endoscopy;  Laterality: N/A;    reports that she has been smoking Cigarettes.  She has a 18 pack-year smoking history. She has never used smokeless tobacco. She reports that she does not drink alcohol or use illicit drugs. family history includes Anesthesia problems in her father; COPD in her mother; Cancer in her father; Diabetes in her brother, father, mother, sister, and sister; Heart disease in her brother, mother, and sister; Hypertension in her sister; Kidney disease in her father; Sleep apnea in her brother. Allergies  Allergen Reactions  . Sulfa Antibiotics Nausea And Vomiting  . Sulfonamide Derivatives Nausea And Vomiting    REACTION: n/v   Current Outpatient Prescriptions on File Prior to Visit  Medication Sig Dispense Refill  . albuterol (PROVENTIL HFA;VENTOLIN HFA) 108 (90 BASE) MCG/ACT inhaler Inhale 1-2 puffs into the lungs every 6 (six) hours as needed for wheezing. 8 g 5  . albuterol (PROVENTIL) (5 MG/ML) 0.5% nebulizer solution Take 0.5 mLs (2.5 mg total) by nebulization at bedtime as needed. As needed  for shortness of breath 20 mL 3  . cetirizine (ZYRTEC) 10 MG tablet Take 1 tablet (10 mg total) by mouth daily. 30 tablet 11  . cyclobenzaprine (FLEXERIL) 5 MG tablet Take 1 tablet (5 mg total) by mouth 3 (three) times daily as needed for muscle spasms. 30 tablet 0  . FLUoxetine (PROZAC) 20 MG capsule Take 1 capsule (20 mg total) by mouth daily. 90 capsule 3  . hydrOXYzine (ATARAX/VISTARIL) 25 MG tablet Take 1 tablet (25 mg total) by mouth 3 (three) times daily as needed. 30 tablet 0  . irbesartan-hydrochlorothiazide (AVALIDE) 150-12.5 MG per tablet Take 1 tablet by mouth daily. 90 tablet 3  . loratadine (CLARITIN) 10  MG tablet Take 1 tablet (10 mg total) by mouth daily. 30 tablet 11  . mometasone-formoterol (DULERA) 200-5 MCG/ACT AERO Inhale 2 puffs into the lungs 2 (two) times daily. 1 Inhaler 3  . NONFORMULARY OR COMPOUNDED ITEM Estradiol 0.02 % 81ml prefilled applicator Sig: apply twice a week 24 each 4  . omeprazole (PRILOSEC) 20 MG capsule Take 1 capsule (20 mg total) by mouth daily. 30 capsule 3  . phenazopyridine (PYRIDIUM) 200 MG tablet Take 1 tablet (200 mg total) by mouth 3 (three) times daily as needed for pain. 9 tablet 0  . tiotropium (SPIRIVA HANDIHALER) 18 MCG inhalation capsule Place 18 mcg into inhaler and inhale daily.    . traMADol (ULTRAM) 50 MG tablet Take 1 tablet (50 mg total) by mouth every 6 (six) hours as needed. For pain. 30 tablet 3   No current facility-administered medications on file prior to visit.    Review of Systems  Constitutional: Negative for unusual diaphoresis or other sweats  HENT: Negative for ringing in ear Eyes: Negative for double vision or worsening visual disturbance.  Respiratory: Negative for choking and stridor.   Gastrointestinal: Negative for vomiting or other signifcant bowel change Genitourinary: Negative for hematuria or decreased urine volume.  Musculoskeletal: Negative for other MSK pain or swelling Skin: Negative for color change and worsening wound.  Neurological: Negative for tremors and numbness other than noted  Psychiatric/Behavioral: Negative for decreased concentration or agitation other than above       Objective:   Physical Exam BP 122/82 mmHg  Pulse 68  Temp(Src) 98 F (36.7 C) (Oral)  Ht 5\' 3"  (1.6 m)  Wt 166 lb (75.297 kg)  BMI 29.41 kg/m2  SpO2 97% VS noted, mild ill Constitutional: Pt appears well-developed, well-nourished.  HENT: Head: NCAT.  Right Ear: External ear normal.  Left Ear: External ear normal.  Eyes: . Pupils are equal, round, and reactive to light. Conjunctivae and EOM are normal Neck: Normal range of  motion. Neck supple.  Cardiovascular: Normal rate and regular rhythm.   Pulmonary/Chest: Effort normal with decreased bilat  breath sounds without rales but + mild bilat wheezing.  Abd:  Soft, NT, ND, + BS Neurological: Pt is alert. Not confused , motor grossly intact Skin: Skin is warm. No rash Psychiatric: Pt behavior is normal. No agitation.     Assessment & Plan:

## 2014-12-21 NOTE — Patient Instructions (Signed)
You had the steroid shot today  Please take all new medication as prescribed - the antibiotic, prednisone, and cough medicine  Please continue all other medications as before, and refills have been done if requested.  Please have the pharmacy call with any other refills you may need.  Please keep your appointments with your specialists as you may have planned  Please go to the XRAY Department in the Basement (go straight as you get off the elevator) for the x-ray testing  Please go to the LAB in the Basement (turn left off the elevator) for the tests to be done today  You will be contacted by phone if any changes need to be made immediately.  Otherwise, you will receive a letter about your results with an explanation, but please check with MyChart first.  Please return in 6 months, or sooner if needed

## 2014-12-21 NOTE — Assessment & Plan Note (Signed)
Declines change in tx at this time, declines counseling, verified no SI

## 2014-12-22 ENCOUNTER — Other Ambulatory Visit: Payer: Self-pay | Admitting: Internal Medicine

## 2014-12-22 ENCOUNTER — Encounter: Payer: Self-pay | Admitting: Internal Medicine

## 2014-12-22 ENCOUNTER — Other Ambulatory Visit (INDEPENDENT_AMBULATORY_CARE_PROVIDER_SITE_OTHER): Payer: Medicare Other

## 2014-12-22 DIAGNOSIS — D62 Acute posthemorrhagic anemia: Secondary | ICD-10-CM | POA: Diagnosis not present

## 2014-12-22 DIAGNOSIS — D509 Iron deficiency anemia, unspecified: Secondary | ICD-10-CM

## 2014-12-22 DIAGNOSIS — D500 Iron deficiency anemia secondary to blood loss (chronic): Secondary | ICD-10-CM | POA: Insufficient documentation

## 2014-12-22 HISTORY — DX: Iron deficiency anemia, unspecified: D50.9

## 2014-12-22 LAB — IBC PANEL
Iron: 36 ug/dL — ABNORMAL LOW (ref 42–145)
Saturation Ratios: 12 % — ABNORMAL LOW (ref 20.0–50.0)
Transferrin: 214.8 mg/dL (ref 212.0–360.0)

## 2014-12-23 ENCOUNTER — Telehealth: Payer: Self-pay | Admitting: Internal Medicine

## 2014-12-23 NOTE — Telephone Encounter (Signed)
emmi emailed °

## 2014-12-30 ENCOUNTER — Telehealth: Payer: Self-pay | Admitting: Internal Medicine

## 2014-12-30 NOTE — Telephone Encounter (Signed)
Patient called in asking for status of referrals on 12/22/2014. Advised that they are being worked and Teaching laboratory technician would call her.

## 2014-12-31 ENCOUNTER — Ambulatory Visit (INDEPENDENT_AMBULATORY_CARE_PROVIDER_SITE_OTHER): Payer: Medicare Other | Admitting: Adult Health

## 2014-12-31 ENCOUNTER — Telehealth: Payer: Self-pay | Admitting: Emergency Medicine

## 2014-12-31 ENCOUNTER — Telehealth: Payer: Self-pay | Admitting: Internal Medicine

## 2014-12-31 ENCOUNTER — Ambulatory Visit (INDEPENDENT_AMBULATORY_CARE_PROVIDER_SITE_OTHER)
Admission: RE | Admit: 2014-12-31 | Discharge: 2014-12-31 | Disposition: A | Discharge status: Home / Self Care | Payer: Medicare Other | Source: Ambulatory Visit | Attending: Adult Health | Admitting: Adult Health

## 2014-12-31 VITALS — BP 110/58 | HR 66 | Ht 63.0 in | Wt 162.0 lb

## 2014-12-31 DIAGNOSIS — F17200 Nicotine dependence, unspecified, uncomplicated: Secondary | ICD-10-CM

## 2014-12-31 DIAGNOSIS — R062 Wheezing: Secondary | ICD-10-CM | POA: Diagnosis not present

## 2014-12-31 DIAGNOSIS — J441 Chronic obstructive pulmonary disease with (acute) exacerbation: Secondary | ICD-10-CM | POA: Diagnosis not present

## 2014-12-31 DIAGNOSIS — Z720 Tobacco use: Secondary | ICD-10-CM

## 2014-12-31 DIAGNOSIS — R05 Cough: Secondary | ICD-10-CM | POA: Diagnosis not present

## 2014-12-31 DIAGNOSIS — R0602 Shortness of breath: Secondary | ICD-10-CM | POA: Diagnosis not present

## 2014-12-31 MED ORDER — LEVALBUTEROL HCL 0.63 MG/3ML IN NEBU
0.6300 mg | INHALATION_SOLUTION | Freq: Once | RESPIRATORY_TRACT | Status: AC
  Administered 2014-12-31: 0.63 mg via RESPIRATORY_TRACT

## 2014-12-31 MED ORDER — PREDNISONE 10 MG PO TABS: ORAL_TABLET | ORAL | Status: DC

## 2014-12-31 MED ORDER — PROMETHAZINE-CODEINE 6.25-10 MG/5ML PO SYRP
5.0000 mL | ORAL_SOLUTION | Freq: Four times a day (QID) | ORAL | Status: DC | PRN
Start: 2014-12-31 — End: 2015-04-05

## 2014-12-31 NOTE — Patient Instructions (Addendum)
Chest xray today  Prednisone taper over next week Phenergan with Codeine syrup 1 tsp every 6hr as needed for cough, may make you sleepy  Mucinex DM Twice daily  As needed  Cough/congestion  Must quit smoking ,  Follow up Dr. Lamonte Sakai in 4 weeks  and as needed. Please contact office for sooner follow up if symptoms do not improve or worsen or seek emergency care

## 2014-12-31 NOTE — Telephone Encounter (Signed)
Spoke with the pt  She c/o cough, increased SOB and CP since last night  She describes the CP as "a burning feeling"  OV with TP at 11 am today

## 2014-12-31 NOTE — Telephone Encounter (Signed)
Patient just did today realize she had that cough medicine.

## 2014-12-31 NOTE — Telephone Encounter (Signed)
Pt may not realize ? That she was rx with promethazine with codeine per Tammy Parrett yesterday  I should not give more of this at this time. thanks

## 2014-12-31 NOTE — Telephone Encounter (Signed)
Patient states she is still coughing.  She states what Dr. Jenny Reichmann prescribed her is not working for her cough.  She states she is still dizzy.  She is afraid being on the third floor.  She states she can't breath and she pee's in her clothes.  She is requesting a cough syrup with Codeine to help her.

## 2014-12-31 NOTE — Progress Notes (Signed)
54 yo former smoker with hx HTN, migraine HA, asthma dx in 2000 after a hospitalization for dyspnea. No asthma as a child. Had been managed on Advair 250 (ran out), now on Dulera + SABA since hosp. She was hospitalized 1/7 - 1/13 for an acute exacerbation.  She has been doing fairly well, is having some stabbing pains with exertion (prior cards eval was reassuring). She had gone back to smoking since last Summer, just quit again when hospitalized. She is weaning pred now. Uses albuterol every day at least once. She is planning to restart exercise routine. She is taking benadryl for itching. She is having a lot of nasal gtt and UA irritation.    Acute OV 05/28/13 -- she has been having more problems with congestion, more problems with wheeze, cough, dyspnea. She was on Dulera, nasonex - hasn't been able to take in months because she lost her job.   ROV 08/07/13 -- f/u COPD, rhinitis, cough. Was seen since last visit by Dr Melvyn Novas with wheeze, sputum, tightness. Was not taking her BD's - couldn;t afford. Was treated for sinusitis with augmentin and pred.  Also changed to bystolic. 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 Using dulera bid, albuterol 2-3 x a day.   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 She had stopped smoking for 2 months, has started back now about 1/2 pk a day. She is on [place] - feels that she benefited from the addition of spiriva. She was just treated with prednisone for an AE with good resolution > she believes it was house full of smoke that triggered her. Uses albuterol every day. Minimal cough or wheeze.   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 She has been doing fairly well, although she just moved to an apartment with 3 flights of stairs. She quit smoking June 2. She is having some insomnia and has been eating a lot. Her cough has been stable. She was just treated for  otitis media. She restarted flonase.   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 I treated her for an AE 6/26 (azithro + pred) > her wheeze improved but she is dealing with fatigue, dizziness, weakness when walking. She has seen some vaginal bleeding x 1 (even though has had a hysterectomy). She has noted bruising on her arms and her thighs since this last weekend.   ROV 10/18/14 -- follow up for severe AFL, COPD with a recent AE treated by TP. Returns for f/u. She is improved but states that her energy is low.  She is still smoking a little - every other day. She is on spiriva and dulera. She uses albuterol every day.    11/19/14  Acute OV  Complains of  Of  cough with clear mucus, tightness, increased SOB, fatigue x2-3 weeks.  denies f/c/s, n/v/d, hemoptysis, PND, leg swelling, chest pain, orthopnea. Patient continues to smoke. We discussed smoking cessation in detail She remains on Spiriva and Dulera She is not using any medications for treatment Recent travel or antibiotic use No emergency room or hospitalizations. Since last visit   .12/31/2014 Acute OV severe AFL, COPD Pt presents for an acute office visit .  Complains of cough, wheezing, shortness of breath worse for last  week  Cough is mainly dry in nature . No discolored mucus or fever.  Had similar flare  last month, given steroid taper . Got better.  Continues to smoke, smoking cessation discussed  cXR today with no acute process.   ROS See HPI    EXAM Gen: Pleasant, well-nourished, in no distress,  normal affect  ENT: No lesions,  mouth clear,  oropharynx clear, very congested, hoarse voice  Neck: No JVD, no TMG, no carotid bruits  Lungs: No use of accessory muscles,  Distant with faint exp  wheezing  Cardiovascular: RRR, heart sounds normal, no murmur or gallops, no peripheral edema  Musculoskeletal: No deformities, no cyanosis or clubbing  Neuro: alert, non focal  Skin: Warm, no lesions or  rashes  CXR 12/31/14  No acute abnormality. Reviewed images/study independently

## 2014-12-31 NOTE — Assessment & Plan Note (Addendum)
Recurrent exacerbation in pt that continues to smoke.   Plan  Chest xray today  Prednisone taper over next week Phenergan with Codeine syrup 1 tsp every 6hr as needed for cough, may make you sleepy  Mucinex DM Twice daily  As needed  Cough/congestion  Must quit smoking ,  Follow up Dr. Lamonte Sakai in 4 weeks  and as needed. Please contact office for sooner follow up if symptoms do not improve or worsen or seek emergency care

## 2015-01-03 NOTE — Assessment & Plan Note (Signed)
Smoking cessation discussed 

## 2015-01-14 ENCOUNTER — Encounter (HOSPITAL_COMMUNITY): Payer: Self-pay | Admitting: Physical Medicine and Rehabilitation

## 2015-01-14 ENCOUNTER — Emergency Department (HOSPITAL_COMMUNITY)
Admission: EM | Admit: 2015-01-14 | Discharge: 2015-01-14 | Discharge status: Against Medical Advice | Payer: Medicare Other | Attending: Emergency Medicine | Admitting: Emergency Medicine

## 2015-01-14 DIAGNOSIS — H53149 Visual discomfort, unspecified: Secondary | ICD-10-CM | POA: Insufficient documentation

## 2015-01-14 DIAGNOSIS — A084 Viral intestinal infection, unspecified: Secondary | ICD-10-CM | POA: Diagnosis not present

## 2015-01-14 DIAGNOSIS — J019 Acute sinusitis, unspecified: Secondary | ICD-10-CM | POA: Diagnosis not present

## 2015-01-14 DIAGNOSIS — I10 Essential (primary) hypertension: Secondary | ICD-10-CM | POA: Diagnosis not present

## 2015-01-14 DIAGNOSIS — R51 Headache: Secondary | ICD-10-CM | POA: Insufficient documentation

## 2015-01-14 DIAGNOSIS — R112 Nausea with vomiting, unspecified: Secondary | ICD-10-CM | POA: Insufficient documentation

## 2015-01-14 DIAGNOSIS — H538 Other visual disturbances: Secondary | ICD-10-CM | POA: Insufficient documentation

## 2015-01-14 DIAGNOSIS — J449 Chronic obstructive pulmonary disease, unspecified: Secondary | ICD-10-CM | POA: Diagnosis not present

## 2015-01-14 DIAGNOSIS — Z720 Tobacco use: Secondary | ICD-10-CM | POA: Insufficient documentation

## 2015-01-14 NOTE — ED Notes (Signed)
Pt reports she is leaving due to wait.

## 2015-01-14 NOTE — ED Notes (Addendum)
Pt presents to department for evaluation of nausea/vomiting and headache. Onset yesterday. Also states blurred vision, and photosensitivity. Pt is alert and oriented x4. No neurological deficits noted. No acute distress noted at the time.

## 2015-01-28 ENCOUNTER — Ambulatory Visit: Payer: Medicare Other | Admitting: Adult Health

## 2015-01-28 DIAGNOSIS — R1310 Dysphagia, unspecified: Secondary | ICD-10-CM | POA: Diagnosis not present

## 2015-01-28 DIAGNOSIS — K219 Gastro-esophageal reflux disease without esophagitis: Secondary | ICD-10-CM | POA: Diagnosis not present

## 2015-01-28 DIAGNOSIS — D649 Anemia, unspecified: Secondary | ICD-10-CM | POA: Diagnosis not present

## 2015-02-03 ENCOUNTER — Other Ambulatory Visit: Payer: Self-pay | Admitting: Gastroenterology

## 2015-02-03 DIAGNOSIS — K644 Residual hemorrhoidal skin tags: Secondary | ICD-10-CM | POA: Diagnosis not present

## 2015-02-03 DIAGNOSIS — D509 Iron deficiency anemia, unspecified: Secondary | ICD-10-CM | POA: Diagnosis not present

## 2015-02-03 DIAGNOSIS — K2980 Duodenitis without bleeding: Secondary | ICD-10-CM | POA: Diagnosis not present

## 2015-02-03 DIAGNOSIS — K2950 Unspecified chronic gastritis without bleeding: Secondary | ICD-10-CM | POA: Diagnosis not present

## 2015-02-03 DIAGNOSIS — R1310 Dysphagia, unspecified: Secondary | ICD-10-CM | POA: Diagnosis not present

## 2015-02-03 DIAGNOSIS — K2970 Gastritis, unspecified, without bleeding: Secondary | ICD-10-CM | POA: Diagnosis not present

## 2015-02-03 DIAGNOSIS — Z800 Family history of malignant neoplasm of digestive organs: Secondary | ICD-10-CM | POA: Diagnosis not present

## 2015-02-03 DIAGNOSIS — K921 Melena: Secondary | ICD-10-CM | POA: Diagnosis not present

## 2015-02-03 DIAGNOSIS — B9681 Helicobacter pylori [H. pylori] as the cause of diseases classified elsewhere: Secondary | ICD-10-CM | POA: Diagnosis not present

## 2015-02-04 ENCOUNTER — Ambulatory Visit: Payer: Medicare Other | Admitting: Adult Health

## 2015-02-17 ENCOUNTER — Encounter: Payer: Self-pay | Admitting: Adult Health

## 2015-02-17 ENCOUNTER — Ambulatory Visit (INDEPENDENT_AMBULATORY_CARE_PROVIDER_SITE_OTHER): Payer: Medicare Other | Admitting: Adult Health

## 2015-02-17 VITALS — BP 132/80 | HR 73 | Temp 98.2°F | Ht 63.0 in | Wt 168.6 lb

## 2015-02-17 DIAGNOSIS — J438 Other emphysema: Secondary | ICD-10-CM | POA: Diagnosis not present

## 2015-02-17 DIAGNOSIS — J3089 Other allergic rhinitis: Secondary | ICD-10-CM

## 2015-02-17 NOTE — Assessment & Plan Note (Signed)
Chronic rhinitis versus sinusitis Patient with no significant improvement after antibiotics Saline rinses and Mucinex Set up for CT sinus.

## 2015-02-17 NOTE — Progress Notes (Signed)
54 yo former smoker with hx HTN, migraine HA, asthma dx in 2000 after a hospitalization for dyspnea. No asthma as a child. Had been managed on Advair 250 (ran out), now on Dulera + SABA since hosp. She was hospitalized 1/7 - 1/13 for an acute exacerbation.  She has been doing fairly well, is having some stabbing pains with exertion (prior cards eval was reassuring). She had gone back to smoking since last Summer, just quit again when hospitalized. She is weaning pred now. Uses albuterol every day at least once. She is planning to restart exercise routine. She is taking benadryl for itching. She is having a lot of nasal gtt and UA irritation.    Acute OV 05/28/13 -- she has been having more problems with congestion, more problems with wheeze, cough, dyspnea. She was on Dulera, nasonex - hasn't been able to take in months because she lost her job.   ROV 08/07/13 -- f/u COPD, rhinitis, cough. Was seen since last visit by Dr Melvyn Novas with wheeze, sputum, tightness. Was not taking her BD's - couldn;t afford. Was treated for sinusitis with augmentin and pred.  Also changed to bystolic. 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 Using dulera bid, albuterol 2-3 x a day.   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 She had stopped smoking for 2 months, has started back now about 1/2 pk a day. She is on [place] - feels that she benefited from the addition of spiriva. She was just treated with prednisone for an AE with good resolution > she believes it was house full of smoke that triggered her. Uses albuterol every day. Minimal cough or wheeze.   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 She has been doing fairly well, although she just moved to an apartment with 3 flights of stairs. She quit smoking June 2. She is having some insomnia and has been eating a lot. Her cough has been stable. She was just treated for  otitis media. She restarted flonase.   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 I treated her for an AE 6/26 (azithro + pred) > her wheeze improved but she is dealing with fatigue, dizziness, weakness when walking. She has seen some vaginal bleeding x 1 (even though has had a hysterectomy). She has noted bruising on her arms and her thighs since this last weekend.   ROV 10/18/14 -- follow up for severe AFL, COPD with a recent AE treated by TP. Returns for f/u. She is improved but states that her energy is low.  She is still smoking a little - every other day. She is on spiriva and dulera. She uses albuterol every day.    11/19/14  Acute OV  Complains of  Of  cough with clear mucus, tightness, increased SOB, fatigue x2-3 weeks.  denies f/c/s, n/v/d, hemoptysis, PND, leg swelling, chest pain, orthopnea. Patient continues to smoke. We discussed smoking cessation in detail She remains on Spiriva and Dulera She is not using any medications for treatment Recent travel or antibiotic use No emergency room or hospitalizations. Since last visit   . 12/31/14  Acute OV severe AFL, COPD Pt presents for an acute office visit .  Complains of cough, wheezing, shortness of breath worse for last  week  Cough is mainly dry in nature . No discolored mucus or fever.  Had  similar flare last month, given steroid taper . Got better.  Continues to smoke, smoking cessation discussed  cXR today with no acute process.  >pred pack   02/17/2015 Follow up COPD  Returns for a 6 week follow-up. Patient says that overall her breathing is doing okay. She remains on Dulera and Spiriva. [place] that she has persistent sinus pain pressure, congestion and drainage. She has been seen by urgent care and given antibiotics on 2 separate occasions. Does not feel that her sinus symptoms are improved. She denies chest pain, tightness, wheezing, f/c/s, hempotysis, swelling in legs  He continues to smoke. We  discussed smoking cessation.    ROS See HPI    EXAM Gen: Pleasant, well-nourished, in no distress,  normal affect  ENT: No lesions,  mouth clear,  oropharynx clear, nasal mucosa pale with clear discharge  Neck: No JVD, no TMG, no carotid bruits  Lungs: No use of accessory muscles,  clear, with no wheezing  Cardiovascular: RRR, heart sounds normal, no murmur or gallops, no peripheral edema  Musculoskeletal: No deformities, no cyanosis or clubbing  Neuro: alert, non focal  Skin: Warm, no lesions or rashes  CXR 12/31/14  No acute abnormality. Reviewed images/study independently

## 2015-02-17 NOTE — Assessment & Plan Note (Signed)
Compensated on present regimen.   Plan  Saline nasal rinses As needed   Mucinex Twice daily  As needed  Cough/congestion  CT sinus  Follow up Dr. Lamonte Sakai  In 6-8 weeks and As needed   Please contact office for sooner follow up if symptoms do not improve or worsen or seek emergency care

## 2015-02-17 NOTE — Patient Instructions (Signed)
Saline nasal rinses As needed   Mucinex Twice daily  As needed  Cough/congestion  CT sinus  Follow up Dr. Lamonte Sakai  In 6-8 weeks and As needed   Please contact office for sooner follow up if symptoms do not improve or worsen or seek emergency care

## 2015-02-17 NOTE — Addendum Note (Signed)
Addended by: Parke Poisson E on: 02/17/2015 04:16 PM   Modules accepted: Orders

## 2015-02-21 ENCOUNTER — Ambulatory Visit (INDEPENDENT_AMBULATORY_CARE_PROVIDER_SITE_OTHER)
Admission: RE | Admit: 2015-02-21 | Discharge: 2015-02-21 | Disposition: A | Discharge status: Home / Self Care | Payer: Medicare Other | Source: Ambulatory Visit | Attending: Adult Health | Admitting: Adult Health

## 2015-02-21 DIAGNOSIS — J3089 Other allergic rhinitis: Secondary | ICD-10-CM | POA: Diagnosis not present

## 2015-02-21 DIAGNOSIS — J3489 Other specified disorders of nose and nasal sinuses: Secondary | ICD-10-CM | POA: Diagnosis not present

## 2015-02-23 NOTE — Progress Notes (Signed)
Quick Note:  Called spoke with patient regarding CT sinus results / recs as stated by TP. Pt voiced her understanding and denied any questions/concerns at this time. Pt will keep her 4.26.16 appt with RB and call the office if her symptoms do not improve or they worsen. ______

## 2015-04-05 ENCOUNTER — Ambulatory Visit (INDEPENDENT_AMBULATORY_CARE_PROVIDER_SITE_OTHER)
Admission: RE | Admit: 2015-04-05 | Discharge: 2015-04-05 | Disposition: A | Discharge status: Home / Self Care | Payer: Medicare Other | Source: Ambulatory Visit | Attending: Pulmonary Disease | Admitting: Pulmonary Disease

## 2015-04-05 ENCOUNTER — Encounter: Payer: Self-pay | Admitting: Pulmonary Disease

## 2015-04-05 ENCOUNTER — Ambulatory Visit (INDEPENDENT_AMBULATORY_CARE_PROVIDER_SITE_OTHER): Payer: Medicare Other | Admitting: Pulmonary Disease

## 2015-04-05 VITALS — BP 136/74 | HR 77 | Temp 97.0°F | Ht 63.0 in | Wt 169.0 lb

## 2015-04-05 DIAGNOSIS — R05 Cough: Secondary | ICD-10-CM

## 2015-04-05 DIAGNOSIS — J441 Chronic obstructive pulmonary disease with (acute) exacerbation: Secondary | ICD-10-CM | POA: Diagnosis not present

## 2015-04-05 DIAGNOSIS — R059 Cough, unspecified: Secondary | ICD-10-CM

## 2015-04-05 MED ORDER — TIOTROPIUM BROMIDE MONOHYDRATE 18 MCG IN CAPS: 18.0000 ug | ORAL_CAPSULE | Freq: Every day | RESPIRATORY_TRACT | Status: DC

## 2015-04-05 MED ORDER — PREDNISONE 20 MG PO TABS: ORAL_TABLET | ORAL | Status: DC

## 2015-04-05 MED ORDER — MOMETASONE FURO-FORMOTEROL FUM 200-5 MCG/ACT IN AERO: 2.0000 | INHALATION_SPRAY | Freq: Two times a day (BID) | RESPIRATORY_TRACT | Status: DC

## 2015-04-05 MED ORDER — CEFUROXIME AXETIL 250 MG PO TABS: 250.0000 mg | ORAL_TABLET | Freq: Two times a day (BID) | ORAL | Status: AC

## 2015-04-05 NOTE — Assessment & Plan Note (Signed)
She is having another exacerbation of her severe combined COPD and asthma. Unfortunately, she continues to smoke which is making this problem worse. She has clear wheezing and limited air movement on exam today but she is not having signs of respiratory failure. In addition to ongoing smoking, her noncompliance with inhaled therapies is contributing to her symptoms  Plan: -Quit smoking -Chest x-ray, check for pneumonia -Resume inhaled therapies including Spiriva and Dulera -Prednisone taper -Ceftin for 5 days

## 2015-04-05 NOTE — Patient Instructions (Signed)
Take the prednisone as prescribed Take the Ceftin as prescribed Stop smoking Using her inhalers as prescribed We will call you with the result of the chest x-ray Follow-up with our clinic as previously scheduled

## 2015-04-05 NOTE — Progress Notes (Signed)
Subjective:    Patient ID: [name], female    DOB: 05/03/1961, 54 y.o.   MRN: [id number]  HPI Chief Complaint  Patient presents with  . Acute Visit    RB pt here for increased wheezing, chest tightness, prod cough with green mucus X1 wk.  mucinex has not helped.     [name] presents to clinic today complaining of 2 weeks of increasing chest tightness, shortness of breath and cough productive of green mucus. She has had some minor sinus symptoms during this time. She has been taking Mucinex over-the-counter which is not helped with her symptoms. She continues to smoke cigarettes at about 5 cigarettes per day. She stopped taking her inhaled therapies about 2 months ago for unclear reasons. She says that she has started to have some increasing shortness of breath in the evenings which is unusual for her. She has been using her albuterol frequently with this has not helped. She denies fever but she does note chills. She has not had night sweats. She has had a sick contact with her grandchild who was sick last week.   Past Medical History  Diagnosis Date  . Asthma   . Anxiety   . COPD (chronic obstructive pulmonary disease)   . Angina   . Depression   . Hypertension   . GERD (gastroesophageal reflux disease)   . Arthritis   . Peripheral vascular disease   . Migraine   . COPD 08/24/2009    Qualifier: Diagnosis of  By: Burnett Kanaris    . Prediabetes 02/23/2014  . HYPERTENSION 05/12/2009    Qualifier: Diagnosis of  By: Lenn Cal Deborra Medina), Wynona Canes    . 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     . ALLERGIC RHINITIS 05/12/2009    Qualifier: Diagnosis of  By: Lenn Cal Deborra Medina), Wynona Canes    . Fibromyalgia 05/14/2014  . Anemia, iron deficiency 12/22/2014      Review of Systems  Constitutional: Positive for fatigue.  Negative for fever and chills.  HENT: Negative for postnasal drip, rhinorrhea and sinus pressure.   Respiratory: Positive for cough, shortness of breath and wheezing.   Cardiovascular: Positive for chest pain. Negative for palpitations and leg swelling.       Objective:   Physical Exam Filed Vitals:   04/05/15 1501  BP: 136/74  Pulse: 77  Temp: 97 F (36.1 C)  TempSrc: Oral  Height: 5\' 3"  (1.6 m)  Weight: 169 lb (76.658 kg)  SpO2: 100%   RA  Gen: No acute distress HENT: OP clear, TM's clear, neck supple PULM: Wheezing bilaterally, normal respiratory effort but poor air movement CV: RRR, no mgr, trace edema GI: BS+, soft, nontender Derm: no cyanosis or rash Psyche: normal mood and affect  Recent clinic notes reviewed where she has been seen for her COPD. She has had multiple exacerbations in the last year.      Assessment & Plan:   COPD exacerbation She is having another exacerbation of her severe combined COPD and asthma. Unfortunately, she continues to smoke which is making this problem worse. She has clear wheezing and limited air movement on exam today but she is not having signs of respiratory failure. In addition to ongoing smoking, her noncompliance with inhaled therapies is contributing to her symptoms  Plan: -Quit smoking -Chest x-ray, check for pneumonia -Resume inhaled  therapies including Spiriva and Dulera -Prednisone taper -Ceftin for 5 days     Updated Medication List Outpatient Encounter Prescriptions as of 04/05/2015  Medication Sig  . albuterol (PROVENTIL HFA;VENTOLIN HFA) 108 (90 BASE) MCG/ACT inhaler Inhale 1-2 puffs into the lungs every 6 (six) hours as needed for wheezing.  Marland Kitchen albuterol (PROVENTIL) (5 MG/ML) 0.5% nebulizer solution Take 0.5 mLs (2.5 mg total) by nebulization at bedtime as needed. As needed for shortness of breath  . cefUROXime (CEFTIN) 250 MG tablet Take 1 tablet (250 mg total) by mouth 2 (two) times daily.  . cyclobenzaprine  (FLEXERIL) 5 MG tablet Take 1 tablet (5 mg total) by mouth 3 (three) times daily as needed for muscle spasms.  Marland Kitchen FLUoxetine (PROZAC) 20 MG capsule Take 1 capsule (20 mg total) by mouth daily.  . hydrOXYzine (ATARAX/VISTARIL) 25 MG tablet Take 1 tablet (25 mg total) by mouth 3 (three) times daily as needed. (Patient not taking: Reported on 02/17/2015)  . irbesartan-hydrochlorothiazide (AVALIDE) 150-12.5 MG per tablet Take 1 tablet by mouth daily.  Marland Kitchen loratadine (CLARITIN) 10 MG tablet Take 1 tablet (10 mg total) by mouth daily.  . mometasone-formoterol (DULERA) 200-5 MCG/ACT AERO Inhale 2 puffs into the lungs 2 (two) times daily.  . NONFORMULARY OR COMPOUNDED ITEM Estradiol 0.02 % 75ml prefilled applicator Sig: apply twice a week  . omeprazole (PRILOSEC) 20 MG capsule Take 1 capsule (20 mg total) by mouth daily.  . predniSONE (DELTASONE) 20 MG tablet Take 40mg  po daily for 3 days, then take 30mg  po daily for 3 days, then take 20mg  po daily for two days, then take 10mg  po daily for 2 days  . tiotropium (SPIRIVA HANDIHALER) 18 MCG inhalation capsule Place 1 capsule (18 mcg total) into inhaler and inhale daily.  . traMADol (ULTRAM) 50 MG tablet Take 1 tablet (50 mg total) by mouth every 6 (six) hours as needed. For pain. (Patient not taking: Reported on 02/17/2015)  . [DISCONTINUED] cetirizine (ZYRTEC) 10 MG tablet Take 1 tablet (10 mg total) by mouth daily.  . [DISCONTINUED] HYDROcodone-homatropine (HYCODAN) 5-1.5 MG/5ML syrup Take 5 mLs by mouth every 6 (six) hours as needed for cough. (Patient not taking: Reported on 04/05/2015)  . [DISCONTINUED] mometasone-formoterol (DULERA) 200-5 MCG/ACT AERO Inhale 2 puffs into the lungs 2 (two) times daily.  . [DISCONTINUED] predniSONE (DELTASONE) 10 MG tablet 3 tabs by mouth per day for 3 days,2tabs per day for 3 days,1tab per day for 3 days (Patient not taking: Reported on 12/31/2014)  . [DISCONTINUED] predniSONE (DELTASONE) 10 MG tablet 4 tabs for 2 days, then 3 tabs  for 2 days, 2 tabs for 2 days, then 1 tab for 2 days, then stop (Patient not taking: Reported on 02/17/2015)  . [DISCONTINUED] promethazine-codeine (PHENERGAN WITH CODEINE) 6.25-10 MG/5ML syrup Take 5 mLs by mouth every 6 (six) hours as needed for cough. (Patient not taking: Reported on 02/17/2015)  . [DISCONTINUED] tiotropium (SPIRIVA HANDIHALER) 18 MCG inhalation capsule Place 18 mcg into inhaler and inhale daily.

## 2015-04-12 ENCOUNTER — Ambulatory Visit: Payer: Medicare Other | Admitting: Emergency Medicine

## 2015-04-12 ENCOUNTER — Encounter: Payer: Self-pay | Admitting: Emergency Medicine

## 2015-04-12 VITALS — BP 108/76 | HR 71 | Ht 63.0 in | Wt 167.0 lb

## 2015-04-12 DIAGNOSIS — J438 Other emphysema: Secondary | ICD-10-CM

## 2015-04-12 MED ORDER — FLUTICASONE PROPIONATE 50 MCG/ACT NA SUSP: 2.0000 | Freq: Two times a day (BID) | NASAL | Status: DC

## 2015-04-12 NOTE — Assessment & Plan Note (Signed)
Recent exacerbation likely due to allergic rhinitis. Improving some continuing to have cough. We'll attempt to treat her allergic disease more aggressively. She will finish her prednisone taper. Continue current bronchodilators. Most importantly she has made an effort to stop smoking and encouraged her to not restart  Please continue your Spiriva and Dulera.  Congratulations on stopping smoking. Do not restart. If you have any desire to restart then call our office and we will help you  Start Zyrtec once a day Start taking fluticasone nasal spray, 2 sprays each nostril twice a day Finish your prednisone taper Follow with Dr Lamonte Sakai in 1 month

## 2015-04-12 NOTE — Patient Instructions (Signed)
Please continue your Spiriva and Dulera.  Congratulations on stopping smoking. Do not restart. If you have any desire to restart then call our office and we will help you  Start Zyrtec once a day Start taking fluticasone nasal spray, 2 sprays each nostril twice a day Finish your prednisone taper Follow with Dr Lamonte Sakai in 1 month

## 2015-04-12 NOTE — Progress Notes (Signed)
Subjective:    Patient ID: [name], female    DOB: January 23, 1961, 54 y.o.   MRN: [id number]  HPI    Expand All Collapse All   54  yo former smoker with hx HTN, migraine HA, asthma dx in 2000 after a hospitalization for dyspnea. No asthma as a child. Had been managed on Advair 250 (ran out), now on Dulera + SABA since hosp. She was hospitalized 1/7 - 1/13 for an acute exacerbation. She has been doing fairly well, is having some stabbing pains with exertion (prior cards eval was reassuring). She had gone back to smoking since last Summer, just quit again when hospitalized. She is weaning pred now. Uses albuterol every day at least once. She is planning to restart exercise routine. She is taking benadryl for itching. She is having a lot of nasal gtt and UA irritation.    Acute OV 05/28/13 -- she has been having more problems with congestion, more problems with wheeze, cough, dyspnea. She was on Dulera, nasonex - hasn't been able to take in months because she lost her job.   ROV 08/07/13 -- f/u COPD, rhinitis, cough. Was seen since last visit by Dr Melvyn Novas with wheeze, sputum, tightness. Was not taking her BD's - couldn;t afford. Was treated for sinusitis with augmentin and pred. Also changed to bystolic. 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 Using dulera bid, albuterol 2-3 x a day.   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 She had stopped smoking for 2 months, has started back now about 1/2 pk a day. She is on [place] - feels that she benefited from the addition of spiriva. She was just treated with prednisone for an AE with good resolution > she believes it was house full of smoke that triggered her. Uses albuterol every day. Minimal cough or wheeze.   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 She has been doing fairly well, although she just moved to an apartment with 3  flights of stairs. She quit smoking June 2. She is having some insomnia and has been eating a lot. Her cough has been stable. She was just treated for otitis media. She restarted flonase.   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 I treated her for an AE 6/26 (azithro + pred) > her wheeze improved but she is dealing with fatigue, dizziness, weakness when walking. She has seen some vaginal bleeding x 1 (even though has had a hysterectomy). She has noted bruising on her arms and her thighs since this last weekend.   ROV 10/18/14 -- follow up for severe AFL, COPD with a recent AE treated by TP. Returns for f/u. She is improved but states that her energy is low. She is still smoking a little - every other day. She is on spiriva and dulera. She uses albuterol every day.       [name] presents to clinic today complaining of 2 weeks of increasing chest tightness, shortness of breath and cough productive of green mucus. She has had some minor sinus symptoms during this time. She has been taking Mucinex over-the-counter which is not helped with her symptoms. She continues to smoke cigarettes at about 5 cigarettes per day. She stopped taking her inhaled therapies about 2 months ago for unclear reasons. She says that she has started to have some increasing shortness  of breath in the evenings which is unusual for her. She has been using her albuterol frequently with this has not helped. She denies fever but she does note chills. She has not had night sweats. She has had a sick contact with her grandchild who was sick last week.  ROV 04/12/15 -- follow-up visit for severe obstructive lung disease, tobacco use, dyspnea. She was seen by Dr Lake Bells a month ago for apparent acute exacerbation. She was treated with prednisone taper and Ceftin. She remains on [place]. She is still finishing pred, has finished the abx. She hasn't smoked for 7 days. Not taking loratadine.     Past Medical  History  Diagnosis Date  . Asthma   . Anxiety   . COPD (chronic obstructive pulmonary disease)   . Angina   . Depression   . Hypertension   . GERD (gastroesophageal reflux disease)   . Arthritis   . Peripheral vascular disease   . Migraine   . COPD 08/24/2009    Qualifier: Diagnosis of  By: Burnett Kanaris    . Prediabetes 02/23/2014  . HYPERTENSION 05/12/2009    Qualifier: Diagnosis of  By: Lenn Cal Deborra Medina), Wynona Canes    . 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     . ALLERGIC RHINITIS 05/12/2009    Qualifier: Diagnosis of  By: Lenn Cal Deborra Medina), Wynona Canes    . Fibromyalgia 05/14/2014  . Anemia, iron deficiency 12/22/2014      Review of Systems  Constitutional: Positive for fatigue. Negative for fever and chills.  HENT: Negative for postnasal drip, rhinorrhea and sinus pressure.   Respiratory: Positive for cough, shortness of breath and wheezing.   Cardiovascular: Positive for chest pain. Negative for palpitations and leg swelling.       Objective:   Physical Exam Filed Vitals:   04/12/15 1525  BP: 108/76  Pulse: 71  Height: 5\' 3"  (1.6 m)  Weight: 167 lb (75.751 kg)  SpO2: 99%   RA  Gen: No acute distress HENT: OP clear, TM's clear, neck supple PULM: Wheezing bilaterally, normal respiratory effort but poor air movement CV: RRR, no mgr, trace edema GI: BS+, soft, nontender Derm: no cyanosis or rash Psyche: normal mood and affect  Recent clinic notes reviewed where she has been seen for her COPD. She has had multiple exacerbations in the last year.      Assessment & Plan:   COPD (chronic obstructive pulmonary disease) Recent exacerbation likely due to allergic rhinitis. Improving some continuing to have cough. We'll attempt to treat her allergic disease more aggressively. She will finish her prednisone  taper. Continue current bronchodilators. Most importantly she has made an effort to stop smoking and encouraged her to not restart  Please continue your Spiriva and Dulera.  Congratulations on stopping smoking. Do not restart. If you have any desire to restart then call our office and we will help you  Start Zyrtec once a day Start taking fluticasone nasal spray, 2 sprays each nostril twice a day Finish your prednisone taper Follow with Dr Lamonte Sakai in 1 month     Updated Medication List Outpatient Encounter Prescriptions as of 04/12/2015  Medication Sig  . albuterol (PROVENTIL HFA;VENTOLIN HFA) 108 (90 BASE) MCG/ACT inhaler Inhale 1-2 puffs into the lungs every 6 (six) hours as needed for wheezing.  Marland Kitchen albuterol (PROVENTIL) (5 MG/ML)  0.5% nebulizer solution Take 0.5 mLs (2.5 mg total) by nebulization at bedtime as needed. As needed for shortness of breath  . cefUROXime (CEFTIN) 250 MG tablet Take 1 tablet (250 mg total) by mouth 2 (two) times daily.  . cyclobenzaprine (FLEXERIL) 5 MG tablet Take 1 tablet (5 mg total) by mouth 3 (three) times daily as needed for muscle spasms.  Marland Kitchen FLUoxetine (PROZAC) 20 MG capsule Take 1 capsule (20 mg total) by mouth daily.  . hydrOXYzine (ATARAX/VISTARIL) 25 MG tablet Take 1 tablet (25 mg total) by mouth 3 (three) times daily as needed.  . irbesartan-hydrochlorothiazide (AVALIDE) 150-12.5 MG per tablet Take 1 tablet by mouth daily.  Marland Kitchen loratadine (CLARITIN) 10 MG tablet Take 1 tablet (10 mg total) by mouth daily.  . mometasone-formoterol (DULERA) 200-5 MCG/ACT AERO Inhale 2 puffs into the lungs 2 (two) times daily.  . NONFORMULARY OR COMPOUNDED ITEM Estradiol 0.02 % 63ml prefilled applicator Sig: apply twice a week  . omeprazole (PRILOSEC) 20 MG capsule Take 1 capsule (20 mg total) by mouth daily.  . predniSONE (DELTASONE) 20 MG tablet Take 40mg  po daily for 3 days, then take 30mg  po daily for 3 days, then take 20mg  po daily for two days, then take 10mg  po daily  for 2 days  . tiotropium (SPIRIVA HANDIHALER) 18 MCG inhalation capsule Place 1 capsule (18 mcg total) into inhaler and inhale daily.  . traMADol (ULTRAM) 50 MG tablet Take 1 tablet (50 mg total) by mouth every 6 (six) hours as needed. For pain.

## 2015-05-12 ENCOUNTER — Other Ambulatory Visit: Payer: Self-pay | Admitting: Gynecology

## 2015-05-12 ENCOUNTER — Other Ambulatory Visit: Payer: Self-pay | Admitting: Internal Medicine

## 2015-05-31 DIAGNOSIS — L299 Pruritus, unspecified: Secondary | ICD-10-CM | POA: Diagnosis not present

## 2015-05-31 DIAGNOSIS — Z0001 Encounter for general adult medical examination with abnormal findings: Secondary | ICD-10-CM | POA: Diagnosis not present

## 2015-06-03 DIAGNOSIS — L500 Allergic urticaria: Secondary | ICD-10-CM | POA: Diagnosis not present

## 2015-06-16 ENCOUNTER — Telehealth: Payer: Self-pay

## 2015-06-16 NOTE — Telephone Encounter (Signed)
Patient schedule dto see Dr. Jenny Reichmann at [place]; LVM to inquire if she could come in early for AWV. To call back

## 2015-06-17 ENCOUNTER — Telehealth: Payer: Self-pay

## 2015-06-17 NOTE — Telephone Encounter (Signed)
2nd outreach regarding arriving early for AWV at 8:15 or 8:30 prior to Dr. Judi Cong' apt at [place] -Left number [phone] to leave a message if she can come if I do not hear from her, will assume she cannot come in.

## 2015-06-17 NOTE — Telephone Encounter (Signed)
Call back from the patient to confirm that she will come in for AWV 7/6 at 8:15

## 2015-06-22 ENCOUNTER — Telehealth: Payer: Self-pay

## 2015-06-22 ENCOUNTER — Other Ambulatory Visit: Payer: Self-pay | Admitting: Internal Medicine

## 2015-06-22 ENCOUNTER — Encounter: Payer: Self-pay | Admitting: Internal Medicine

## 2015-06-22 ENCOUNTER — Other Ambulatory Visit (INDEPENDENT_AMBULATORY_CARE_PROVIDER_SITE_OTHER): Payer: Medicare Other

## 2015-06-22 ENCOUNTER — Ambulatory Visit (INDEPENDENT_AMBULATORY_CARE_PROVIDER_SITE_OTHER): Payer: Medicare Other | Admitting: Internal Medicine

## 2015-06-22 VITALS — BP 118/84 | HR 70 | Ht 63.0 in | Wt 173.0 lb

## 2015-06-22 DIAGNOSIS — Z0000 Encounter for general adult medical examination without abnormal findings: Secondary | ICD-10-CM

## 2015-06-22 DIAGNOSIS — R071 Chest pain on breathing: Secondary | ICD-10-CM

## 2015-06-22 DIAGNOSIS — M25552 Pain in left hip: Secondary | ICD-10-CM

## 2015-06-22 DIAGNOSIS — M25562 Pain in left knee: Secondary | ICD-10-CM

## 2015-06-22 DIAGNOSIS — R079 Chest pain, unspecified: Secondary | ICD-10-CM | POA: Insufficient documentation

## 2015-06-22 DIAGNOSIS — D509 Iron deficiency anemia, unspecified: Secondary | ICD-10-CM

## 2015-06-22 DIAGNOSIS — Z1231 Encounter for screening mammogram for malignant neoplasm of breast: Secondary | ICD-10-CM

## 2015-06-22 DIAGNOSIS — M25561 Pain in right knee: Secondary | ICD-10-CM | POA: Insufficient documentation

## 2015-06-22 DIAGNOSIS — L299 Pruritus, unspecified: Secondary | ICD-10-CM | POA: Diagnosis not present

## 2015-06-22 DIAGNOSIS — I10 Essential (primary) hypertension: Secondary | ICD-10-CM | POA: Diagnosis not present

## 2015-06-22 DIAGNOSIS — F329 Major depressive disorder, single episode, unspecified: Secondary | ICD-10-CM

## 2015-06-22 DIAGNOSIS — M25551 Pain in right hip: Secondary | ICD-10-CM

## 2015-06-22 DIAGNOSIS — F32A Depression, unspecified: Secondary | ICD-10-CM

## 2015-06-22 LAB — BASIC METABOLIC PANEL
BUN: 15 mg/dL (ref 6–23)
CO2: 31 mEq/L (ref 19–32)
Calcium: 9.5 mg/dL (ref 8.4–10.5)
Chloride: 103 mEq/L (ref 96–112)
Creatinine, Ser: 0.78 mg/dL (ref 0.40–1.20)
GFR: 99.55 mL/min (ref >60.00)
Glucose, Bld: 83 mg/dL (ref 70–99)
Potassium: 4.8 mEq/L (ref 3.5–5.1)
Sodium: 139 mEq/L (ref 135–145)

## 2015-06-22 LAB — CBC WITH DIFFERENTIAL/PLATELET
Basophils Absolute: 0.2 10*3/uL — ABNORMAL HIGH (ref 0.0–0.1)
Basophils Relative: 1.3 % (ref 0.0–3.0)
Eosinophils Absolute: 0.2 10*3/uL (ref 0.0–0.7)
Eosinophils Relative: 1.6 % (ref 0.0–5.0)
HCT: 39.2 % (ref 36.0–46.0)
Hemoglobin: 12.8 g/dL (ref 12.0–15.0)
Lymphocytes Relative: 24.9 % (ref 12.0–46.0)
Lymphs Abs: 3 10*3/uL (ref 0.7–4.0)
MCHC: 32.5 g/dL (ref 30.0–36.0)
MCV: 95.2 fl (ref 78.0–100.0)
Monocytes Absolute: 1.2 10*3/uL — ABNORMAL HIGH (ref 0.1–1.0)
Monocytes Relative: 9.7 % (ref 3.0–12.0)
Neutro Abs: 7.6 10*3/uL (ref 1.4–7.7)
Neutrophils Relative %: 62.5 % (ref 43.0–77.0)
Platelets: 300 10*3/uL (ref 150.0–400.0)
RBC: 4.12 Mil/uL (ref 3.87–5.11)
RDW: 14.9 % (ref 11.5–15.5)
WBC: 12.2 10*3/uL — ABNORMAL HIGH (ref 4.0–10.5)

## 2015-06-22 LAB — IBC PANEL
Iron: 41 ug/dL — ABNORMAL LOW (ref 42–145)
Saturation Ratios: 12.2 % — ABNORMAL LOW (ref 20.0–50.0)
Transferrin: 241 mg/dL (ref 212.0–360.0)

## 2015-06-22 MED ORDER — CETIRIZINE HCL 10 MG PO TABS: 10.0000 mg | ORAL_TABLET | Freq: Every day | ORAL | Status: DC

## 2015-06-22 MED ORDER — TIZANIDINE HCL 4 MG PO TABS: 4.0000 mg | ORAL_TABLET | Freq: Four times a day (QID) | ORAL | Status: DC | PRN

## 2015-06-22 MED ORDER — MELOXICAM 15 MG PO TABS: 15.0000 mg | ORAL_TABLET | Freq: Every day | ORAL | Status: DC

## 2015-06-22 NOTE — Progress Notes (Signed)
Subjective:    Patient ID: [name], female    DOB: [birth year], 54 y.o.   MRN: [id number]  HPI    Here for yearly f/u;  Overall doing ok;  Pt denies Chest pain, worsening SOB, DOE, wheezing, orthopnea, PND, worsening LE edema, palpitations, dizziness or syncope, except for 1 wk onset left lateral sharp pleuritic CP with tenderness to one area, worse to twist at the waist as well..  Pt denies neurological change such as new headache, facial or extremity weakness.  Pt denies polydipsia, polyuria, or low sugar symptoms. Pt states overall good compliance with treatment and medications, good tolerability, and has been trying to follow appropriate diet.  Pt denies worsening depressive symptoms, suicidal ideation or panic. No fever, night sweats, wt loss, loss of appetite, or other constitutional symptoms.  Pt states good ability with ADL's, has low fall risk, home safety reviewed and adequate, no other significant changes in hearing or vision, and only occasionally active with exercise.  Does have some achiness of the hips bilat and knee as well, stiffness to hips to stand up after sitting some time, and knee with recurrent pain, actually both gave out at same time walking in the parking lot and fell.  Pt continues to have recurring LBP without change in severity, bowel or bladder change, fever, wt loss,  worsening LE pain/numbness/weakness, gait change or falls except for above, with pain only wore to bending for some time with bending to wash dishes and running bath water.  Has known lumbar disc dz, no prior surgury, has seen ortho with MRI last yr but not sure of other details.  MRI on record dec 2014 with: IMPRESSION: Minimal disc bulge at L5-S1 with no neural impingement. Otherwise, normal MRI of the lumbar spine. Also c/o itching all over and eye redness, better one time with steroid shot and prednisone per [facility], just finished last wk.  Now minimal, seems to be trying to start  back.  Has seen GI ([facility]) with egd feb 2016 and prior colonsocpy jan 2013 - neg eval for iron def anemia eval.  No overt bleeding or bruising. Has been taking the iron daily  Taking also quite a bit of goody powders for hip and bilat knee pain above daily.  Flexeril no longer working as well for hip pain. Past Medical History  Diagnosis Date  . Asthma   . Anxiety   . COPD (chronic obstructive pulmonary disease)   . Angina   . Depression   . Hypertension   . GERD (gastroesophageal reflux disease)   . Arthritis   . Peripheral vascular disease   . Migraine   . COPD 08/24/2009    Qualifier: Diagnosis of  By: Burnett Kanaris    . Prediabetes 02/23/2014  . HYPERTENSION 05/12/2009    Qualifier: Diagnosis of  By: Lenn Cal Deborra Medina), Wynona Canes    . 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     . ALLERGIC RHINITIS 05/12/2009    Qualifier: Diagnosis of  By: Lenn Cal Deborra Medina), Wynona Canes    . Fibromyalgia 05/14/2014  . Anemia, iron deficiency 12/22/2014   Past Surgical History  Procedure Laterality Date  . Neck surgery    . Toe surgery    . Abdominal hysterectomy    . Tubal ligation    . Kidney stone surgery    .  Hemorrhoid surgery    . Incise and drain abcess    . Back surgery    . Diagnostic laparoscopy    . Colonoscopy  12/20/2011    Procedure: COLONOSCOPY;  Surgeon: Landry Dyke, MD;  Location: [facility];  Service: Endoscopy;  Laterality: N/A;  . Colonoscopy  03/05/2012    Procedure: COLONOSCOPY;  Surgeon: Landry Dyke, MD;  Location: [facility];  Service: Endoscopy;  Laterality: N/A;    reports that she quit smoking about 2 months ago. Her smoking use included Cigarettes. She has a 18 pack-year smoking history. She has never used smokeless tobacco. She reports that she does not drink alcohol or use illicit drugs. family  history includes Anesthesia problems in her father; COPD in her mother; Cancer in her father; Diabetes in her brother, father, mother, sister, and sister; Heart disease in her brother, mother, and sister; Hypertension in her sister; Kidney disease in her father; Sleep apnea in her brother. Allergies  Allergen Reactions  . Sulfa Antibiotics Nausea And Vomiting  . Sulfonamide Derivatives Nausea And Vomiting    REACTION: n/v   Current Outpatient Prescriptions on File Prior to Visit  Medication Sig Dispense Refill  . albuterol (PROVENTIL HFA;VENTOLIN HFA) 108 (90 BASE) MCG/ACT inhaler Inhale 1-2 puffs into the lungs every 6 (six) hours as needed for wheezing. 8 g 5  . albuterol (PROVENTIL) (5 MG/ML) 0.5% nebulizer solution Take 0.5 mLs (2.5 mg total) by nebulization at bedtime as needed. As needed for shortness of breath 20 mL 3  . cyclobenzaprine (FLEXERIL) 5 MG tablet Take 1 tablet (5 mg total) by mouth 3 (three) times daily as needed for muscle spasms. (Patient not taking: Reported on 06/22/2015) 30 tablet 0  . FLUoxetine (PROZAC) 20 MG capsule Take 1 capsule (20 mg total) by mouth daily. 90 capsule 3  . fluticasone (FLONASE) 50 MCG/ACT nasal spray Place 2 sprays into both nostrils 2 (two) times daily. 16 g 2  . hydrOXYzine (ATARAX/VISTARIL) 25 MG tablet TAKE 1 TABLET BY MOUTH THREE TIMES DAILY AS NEEDED 30 tablet 0  . irbesartan-hydrochlorothiazide (AVALIDE) 150-12.5 MG per tablet Take 1 tablet by mouth daily. 90 tablet 3  . loratadine (CLARITIN) 10 MG tablet Take 1 tablet (10 mg total) by mouth daily. 30 tablet 11  . mometasone-formoterol (DULERA) 200-5 MCG/ACT AERO Inhale 2 puffs into the lungs 2 (two) times daily. 1 Inhaler 3  . NONFORMULARY OR COMPOUNDED ITEM Estradiol 0.02 % 80ml prefilled applicator Sig: apply twice a week (Patient not taking: Reported on 06/22/2015) 24 each 4  . omeprazole (PRILOSEC) 20 MG capsule Take 1 capsule (20 mg total) by mouth daily. (Patient not taking: Reported on  06/22/2015) 30 capsule 3  . tiotropium (SPIRIVA HANDIHALER) 18 MCG inhalation capsule Place 1 capsule (18 mcg total) into inhaler and inhale daily. 30 capsule 5  . traMADol (ULTRAM) 50 MG tablet Take 1 tablet (50 mg total) by mouth every 6 (six) hours as needed. For pain. 30 tablet 3   No current facility-administered medications on file prior to visit.    Review of Systems Constitutional: Negative for increased diaphoresis, other activity, appetite or siginficant weight change other than noted HENT: Negative for worsening hearing loss, ear pain, facial swelling, mouth sores and neck stiffness.   Eyes: Negative for other worsening pain, redness or visual disturbance.  Respiratory: Negative for shortness of breath and wheezing  Cardiovascular: Negative for chest pain and palpitations.  Gastrointestinal: Negative for diarrhea, blood in stool, abdominal distention or other  pain Genitourinary: Negative for hematuria, flank pain or change in urine volume.  Musculoskeletal: Negative for myalgias or other joint complaints.  Skin: Negative for color change and wound or drainage.  Neurological: Negative for syncope and numbness. other than noted Hematological: Negative for adenopathy. or other swelling Psychiatric/Behavioral: Negative for hallucinations, SI, self-injury, decreased concentration or other worsening agitation.      Objective:   Physical Exam BP 118/84 mmHg  Pulse 70  Ht 5\' 3"  (1.6 m)  Wt 173 lb (78.472 kg)  BMI 30.65 kg/m2  SpO2 97% VS noted,  Constitutional: Pt is oriented to person, place, and time. Appears well-developed and well-nourished, in no significant distress Head: Normocephalic and atraumatic.  Right Ear: External ear normal.  Left Ear: External ear normal.  Nose: Nose normal.  Mouth/Throat: Oropharynx is clear and moist.  Eyes: Conjunctivae and EOM are normal. Pupils are equal, round, and reactive to light.  Neck: Normal range of motion. Neck supple. No JVD  present. No tracheal deviation present or significant neck LA or mass Cardiovascular: Normal rate, regular rhythm, normal heart sounds and intact distal pulses.   Pulmonary/Chest: Effort normal and breath sounds without rales or wheezing  Abdominal: Soft. Bowel sounds are normal. NT. No HSM  Musculoskeletal: Normal range of motion. Exhibits no edema.  Lymphadenopathy:  Has no cervical adenopathy.  Neurological: Pt is alert and oriented to person, place, and time. Pt has normal reflexes. No cranial nerve deficit. Motor grossly intact Skin: Skin is warm and dry. No rash noted.  Psychiatric:  Has normal mood and affect. Behavior is normal.     Assessment & Plan:

## 2015-06-22 NOTE — Progress Notes (Signed)
Subjective:   [name] is a 54 y.o. female who presents for Medicare Annual (Subsequent) preventive examination.  Review of Systems:   Presented c/o of new pain in left side under arm; and at times around neck; shoulders; no sob; no chest tightness;   Exercise; lives on 3rd floor apt and climbs stairs x 2 a day which is her exercise; Otherwise/ c/o of pain in hips and knees;  Diet; Likes fruits and vegetables, chicken and fish  Vision exam: needs referral to eye doctor; states eye sight is getting worse;     Patient states she has some memory issues; Mostly in the form of recall;      Objective:     Vitals: BP 118/84 mmHg  Pulse 70  Ht 5\' 3"  (1.6 m)  Wt 173 lb (78.472 kg)  BMI 30.65 kg/m2  SpO2 97%  Tobacco History  Smoking status  . Former Smoker -- 0.50 packs/day for 36 years  . Types: Cigarettes  . Quit date: 04/05/2015  Smokeless tobacco  . Never Used    Comment: Started back again end of June;      Counseling given: Not Answered  Referral to [facility] for smoking cessation  Past Medical History  Diagnosis Date  . Asthma   . Anxiety   . COPD (chronic obstructive pulmonary disease)   . Angina   . Depression   . Hypertension   . GERD (gastroesophageal reflux disease)   . Arthritis   . Peripheral vascular disease   . Migraine   . COPD 08/24/2009    Qualifier: Diagnosis of  By: Burnett Kanaris    . Prediabetes 02/23/2014  . HYPERTENSION 05/12/2009    Qualifier: Diagnosis of  By: Lenn Cal Deborra Medina), Wynona Canes    . 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     . ALLERGIC RHINITIS 05/12/2009    Qualifier: Diagnosis of  By: Lenn Cal Deborra Medina), Wynona Canes    . Fibromyalgia 05/14/2014  . Anemia, iron deficiency 12/22/2014   Past Surgical History  Procedure Laterality Date  . Neck surgery     . Toe surgery    . Abdominal hysterectomy    . Tubal ligation    . Kidney stone surgery    . Hemorrhoid surgery    . Incise and drain abcess    . Back surgery    . Diagnostic laparoscopy    . Colonoscopy  12/20/2011    Procedure: COLONOSCOPY;  Surgeon: Landry Dyke, MD;  Location: [facility];  Service: Endoscopy;  Laterality: N/A;  . Colonoscopy  03/05/2012    Procedure: COLONOSCOPY;  Surgeon: Landry Dyke, MD;  Location: [facility];  Service: Endoscopy;  Laterality: N/A;   Family History  Problem Relation Age of Onset  . Diabetes Mother   . COPD Mother   . Heart disease Mother   . Diabetes Father   . Kidney disease Father   . Cancer Father   . Anesthesia problems Father   . Diabetes Sister   . Hypertension Sister   . Diabetes Brother   . Sleep apnea Brother   . Heart disease Sister   . Diabetes Sister   . Heart disease Brother    History  Sexual Activity  . Sexual Activity: Yes  . Birth Control/ Protection: Surgical    Outpatient Encounter Prescriptions as of 06/22/2015  Medication Sig  . albuterol (PROVENTIL HFA;VENTOLIN HFA) 108 (90 BASE) MCG/ACT inhaler Inhale 1-2 puffs into the lungs every 6 (six) hours as needed for wheezing.  Marland Kitchen albuterol (PROVENTIL) (5 MG/ML) 0.5% nebulizer solution Take 0.5 mLs (2.5 mg total) by nebulization at bedtime as needed. As needed for shortness of breath  . cyclobenzaprine (FLEXERIL) 5 MG tablet Take 1 tablet (5 mg total) by mouth 3 (three) times daily as needed for muscle spasms.  Marland Kitchen FLUoxetine (PROZAC) 20 MG capsule Take 1 capsule (20 mg total) by mouth daily.  . fluticasone (FLONASE) 50 MCG/ACT nasal spray Place 2 sprays into both nostrils 2 (two) times daily.  . hydrOXYzine (ATARAX/VISTARIL) 25 MG tablet TAKE 1 TABLET BY MOUTH THREE TIMES DAILY AS NEEDED  . irbesartan-hydrochlorothiazide (AVALIDE) 150-12.5 MG per tablet Take 1 tablet by mouth daily.  Marland Kitchen loratadine (CLARITIN) 10 MG tablet Take 1 tablet (10 mg total) by mouth  daily.  . mometasone-formoterol (DULERA) 200-5 MCG/ACT AERO Inhale 2 puffs into the lungs 2 (two) times daily.  . NONFORMULARY OR COMPOUNDED ITEM Estradiol 0.02 % 41ml prefilled applicator Sig: apply twice a week  . omeprazole (PRILOSEC) 20 MG capsule Take 1 capsule (20 mg total) by mouth daily.  . predniSONE (DELTASONE) 20 MG tablet Take 40mg  po daily for 3 days, then take 30mg  po daily for 3 days, then take 20mg  po daily for two days, then take 10mg  po daily for 2 days  . tiotropium (SPIRIVA HANDIHALER) 18 MCG inhalation capsule Place 1 capsule (18 mcg total) into inhaler and inhale daily.  . traMADol (ULTRAM) 50 MG tablet Take 1 tablet (50 mg total) by mouth every 6 (six) hours as needed. For pain.   No facility-administered encounter medications on file as of 06/22/2015.    Activities of Daily Living No flowsheet data found.  Patient Care Team: Biagio Borg, MD as PCP - General (Internal Medicine)    Assessment:    Objective:   The goal of the wellness visit is to assist the patient how to close the gaps in care and create a preventative care plan for the patient.  Personalized Education was given regarding:   Pt determined a personalized goal; see patient goals;  Assessment included: Smoking cessation plan  Metabolic screening information given regarding risk for CVD and diabetes Bone density scan as appropriate- not age appropriate but does smoke;  Taking meds without issues; no barriers identified Labs were and fup visit noted with MD if labs are due to be re-drawn. Stress: Recommendations for managing stress if assessed as a factor; Financial issues  No Risk for hepatitis or high risk social behavior identified via hepatitis screen; no new partners;  Educated on Vaccines; risk can a PSV 23  Safety issues reviewed; Cognition assessed by AD8; Score 0 MMSE deferred as the patient stated they had no memory issues; Mammogram; Patient has one every year PAP due 3/  2018 Colonoscopy due 02/2022    Exercise Activities and Dietary recommendations    Goals    None     Fall Risk Fall Risk  12/21/2014 04/22/2014 07/22/2013  Falls in the past year? Yes Yes Yes  Number falls in past yr: 1 2 or more 2 or more  Injury with Fall? No - -  Risk Factor Category  - (No Data) High Fall Risk   Depression Screen PHQ 2/9 Scores 12/21/2014 04/22/2014 07/22/2013 08/16/2011  PHQ - 2 Score 1 1 0 3  PHQ- 9 Score - - - 24  Cognitive Testing No flowsheet data found.  Immunization History  Administered Date(s) Administered  . Influenza Split 10/23/2011  . Influenza Whole 09/16/2012  . Influenza,inj,Quad PF,36+ Mos 10/18/2014  . Td 04/22/2014   Screening Tests Health Maintenance  Topic Date Due  . PNEUMOCOCCAL POLYSACCHARIDE VACCINE (1) 12/20/1964  . FOOT EXAM  12/20/1972  . OPHTHALMOLOGY EXAM  12/20/1972  . URINE MICROALBUMIN  12/20/1972  . HEMOGLOBIN A1C  06/15/2014  . INFLUENZA VACCINE  07/18/2015  . MAMMOGRAM  03/31/2016  . PAP SMEAR  02/23/2017  . COLONOSCOPY  03/05/2022  . TETANUS/TDAP  04/22/2024  . HIV Screening  Completed      Plan:  Plan   The patient agrees to: Contact [facility] for smoking cessation fup with eye visit/ will make eye referral Mammogram to be scheduled Referral to [place] directive:will give infor on advanced directives   During the course of the visit the patient was educated and counseled about the following appropriate screening and preventive services:   Vaccines to include Pneumoccal, Influenza, Hepatitis B, Td, Zostavax, HCV  Electrocardiogram  Cardiovascular Disease  Colorectal cancer screening  Bone density screening  Diabetes screening  Glaucoma screening  Mammography/PAP  Nutrition counseling   Patient Instructions (the written plan) was given to the patient.   Wynetta Fines, RN  06/22/2015

## 2015-06-22 NOTE — Assessment & Plan Note (Addendum)
?   Goody powder related, to d/c goody powders, meloxicam prn only for pain, and check cbc, iron., bmet  to f/u any worsening symptoms or concerns Lab Results  Component Value Date   WBC 9.1 12/21/2014   HGB 10.9* 12/21/2014   HCT 33.5* 12/21/2014   MCV 93.8 12/21/2014   PLT 305.0 12/21/2014   Note:  Total time for pt hx, exam, review of record with pt in the room, determination of diagnoses and plan for further eval and tx is > 40 min, with over 50% spent in coordination and counseling of patient

## 2015-06-22 NOTE — Assessment & Plan Note (Signed)
Resuming again after successful tx with steroid shot and prednisone , no rahs today, for zyrtec prn

## 2015-06-22 NOTE — Assessment & Plan Note (Signed)
Cont med tx, verified no Si or HI, for counseling referral, also for smoking cessation referral, and optometry as she is due

## 2015-06-22 NOTE — Assessment & Plan Note (Signed)
With one episode giveaway and ongoing pain, no effusions today, for sport med referral

## 2015-06-22 NOTE — Progress Notes (Signed)
Pre visit review using our clinic review tool, if applicable. No additional management support is needed unless otherwise documented below in the visit note. 

## 2015-06-22 NOTE — Patient Instructions (Addendum)
Please stop the [place] Powder use, and ok to stop the flexeril  Please take all new medication as prescribed - the meloxicam for pain (anti-inflammatory), and the zyrtec (for itching/allergies)  OK to continue the iron for now, but this can be stopped if your iron is back to normal today  Please continue all other medications as before, and refills have been done if requested.  Please have the pharmacy call with any other refills you may need.  Please continue your efforts at being more active, low cholesterol diet, and weight control.  You are otherwise up to date with prevention measures today.  Please keep your appointments with your specialists as you may have planned  Please go to the XRAY Department in the Basement (go straight as you get off the elevator) for the x-ray testing  Please go to the LAB in the Basement (turn left off the elevator) for the tests to be done today  You will be contacted by phone if any changes need to be made immediately.  Otherwise, you will receive a letter about your results with an explanation, but please check with MyChart first.   Please return in 6 months, or sooner if needed    [name] , Thank you for taking time to come for your Medicare Wellness Visit. I appreciate your ongoing commitment to your health goals. Please review the following plan we discussed and let me know if I can assist you in the future.   The patient agrees to call insurer to discuss [place] contact for ongoing counseling  Given [facility] Advanced Directive forms  These are the goals we discussed: See goal below Goals    None      Wants to quit smoking again;  Given referal to [facility] smoking cessation class Smoking;  Educated to avoid secondary smoke Smoking cessation at [facility]: [phone] Meds may help; chatix (Varenicline); Zyban (Bupropion SR); Nicotine Replacement (gum; lozenges; patches; etc.)  Also needs eye exam and will request  referral  Controllable  risk for heart disease reviewed for goal setting: Includes; family hx; HTN; decreased renal function; obesity; sedentary lifestyle Educated regarding Metabolic syndrome / Factors that increase risk for Heart Disease: Heart Healthy Diet; less sugar is helpful  Excess body fat around the waist Waist circumference women >35 Triglycerides > 150 HDL < 50 BP > 130/85 Glucose > 100 Goals are determined by the physician and based on other relevant data and risk Plan Lifestyle changes  BMI reviewed 30   Risk Calculator: [email or web address]  Stress; (1-5) Risk and reduction techniques reviewed     This is a list of the screening recommended for you and due dates:  Health Maintenance  Topic Date Due  . Pneumococcal vaccine (1) 12/20/1964  . Complete foot exam   12/20/1972  . Eye exam for diabetics  12/20/1972  . Urine Protein Check  12/20/1972  . Hemoglobin A1C  06/15/2014  . Flu Shot  07/18/2015  . Mammogram  03/31/2016  . Pap Smear  02/23/2017  . Colon Cancer Screening  03/05/2022  . Tetanus Vaccine  04/22/2024  . HIV Screening  Completed     Fall Prevention and Home Safety Falls cause injuries and can affect all age groups. It is possible to prevent falls.  HOW TO PREVENT FALLS  Wear shoes with rubber soles that do not have an opening for your toes.  Keep the inside and outside of your house well lit.  Use night lights throughout your home.  Remove clutter from floors.  Clean up floor spills.  Remove throw rugs or fasten them to the floor with carpet tape.  Do not place electrical cords across pathways.  Put grab bars by your tub, shower, and toilet. Do not use towel bars as grab bars.  Put handrails on both sides of the stairway. Fix loose handrails.  Do not climb on stools or stepladders, if possible.  Do not wax your floors.  Repair uneven or unsafe sidewalks, walkways, or stairs.  Keep items you use a lot within  reach.  Be aware of pets.  Keep emergency numbers next to the telephone.  Put smoke detectors in your home and near bedrooms. Ask your doctor what other things you can do to prevent falls. Document Released: 09/29/2009 Document Revised: 06/03/2012 Document Reviewed: 03/04/2012 [facility] Patient Information 2015 [place], [place]. This information is not intended to replace advice given to you by your health care provider. Make sure you discuss any questions you have with your health care provider.  Health Maintenance Adopting a healthy lifestyle and getting preventive care can go a long way to promote health and wellness. Talk with your health care provider about what schedule of regular examinations is right for you. This is a good chance for you to check in with your provider about disease prevention and staying healthy. In between checkups, there are plenty of things you can do on your own. Experts have done a lot of research about which lifestyle changes and preventive measures are most likely to keep you healthy. Ask your health care provider for more information. WEIGHT AND DIET  Eat a healthy diet  Be sure to include plenty of vegetables, fruits, low-fat dairy products, and lean protein.  Do not eat a lot of foods high in solid fats, added sugars, or salt.  Get regular exercise. This is one of the most important things you can do for your health.  Most adults should exercise for at least 150 minutes each week. The exercise should increase your heart rate and make you sweat (moderate-intensity exercise).  Most adults should also do strengthening exercises at least twice a week. This is in addition to the moderate-intensity exercise.  Maintain a healthy weight  Body mass index (BMI) is a measurement that can be used to identify possible weight problems. It estimates body fat based on height and weight. Your health care provider can help determine your BMI and help you achieve or  maintain a healthy weight.  For females 82 years of age and older:   A BMI below 18.5 is considered underweight.  A BMI of 18.5 to 24.9 is normal.  A BMI of 25 to 29.9 is considered overweight.  A BMI of 30 and above is considered obese.  Watch levels of cholesterol and blood lipids  You should start having your blood tested for lipids and cholesterol at 52 years of age, then have this test every 5 years.  You may need to have your cholesterol levels checked more often if:  Your lipid or cholesterol levels are high.  You are older than 52 years of age.  You are at high risk for heart disease.  CANCER SCREENING   Lung Cancer  Lung cancer screening is recommended for adults 59-61 years old who are at high risk for lung cancer because of a history of smoking.  A yearly low-dose CT scan of the lungs is recommended for people who:  Currently smoke.  Have quit within the past 15  years.  Have at least a 30-pack-year history of smoking. A pack year is smoking an average of one pack of cigarettes a day for 1 year.  Yearly screening should continue until it has been 15 years since you quit.  Yearly screening should stop if you develop a health problem that would prevent you from having lung cancer treatment.  Breast Cancer  Practice breast self-awareness. This means understanding how your breasts normally appear and feel.  It also means doing regular breast self-exams. Let your health care provider know about any changes, no matter how small.  If you are in your 20s or 30s, you should have a clinical breast exam (CBE) by a health care provider every 1-3 years as part of a regular health exam.  If you are 57 or older, have a CBE every year. Also consider having a breast X-ray (mammogram) every year.  If you have a family history of breast cancer, talk to your health care provider about genetic screening.  If you are at high risk for breast cancer, talk to your health care  provider about having an MRI and a mammogram every year.  Breast cancer gene (BRCA) assessment is recommended for women who have family members with BRCA-related cancers. BRCA-related cancers include:  Breast.  Ovarian.  Tubal.  Peritoneal cancers.  Results of the assessment will determine the need for genetic counseling and BRCA1 and BRCA2 testing. Cervical Cancer Routine pelvic examinations to screen for cervical cancer are no longer recommended for nonpregnant women who are considered low risk for cancer of the pelvic organs (ovaries, uterus, and vagina) and who do not have symptoms. A pelvic examination may be necessary if you have symptoms including those associated with pelvic infections. Ask your health care provider if a screening pelvic exam is right for you.   The Pap test is the screening test for cervical cancer for women who are considered at risk.  If you had a hysterectomy for a problem that was not cancer or a condition that could lead to cancer, then you no longer need Pap tests.  If you are older than 65 years, and you have had normal Pap tests for the past 10 years, you no longer need to have Pap tests.  If you have had past treatment for cervical cancer or a condition that could lead to cancer, you need Pap tests and screening for cancer for at least 20 years after your treatment.  If you no longer get a Pap test, assess your risk factors if they change (such as having a new sexual partner). This can affect whether you should start being screened again.  Some women have medical problems that increase their chance of getting cervical cancer. If this is the case for you, your health care provider may recommend more frequent screening and Pap tests.  The human papillomavirus (HPV) test is another test that may be used for cervical cancer screening. The HPV test looks for the virus that can cause cell changes in the cervix. The cells collected during the Pap test can be  tested for HPV.  The HPV test can be used to screen women 57 years of age and older. Getting tested for HPV can extend the interval between normal Pap tests from three to five years.  An HPV test also should be used to screen women of any age who have unclear Pap test results.  After 52 years of age, women should have HPV testing as often as Pap tests.  Colorectal Cancer  This type of cancer can be detected and often prevented.  Routine colorectal cancer screening usually begins at 52 years of age and continues through 52 years of age.  Your health care provider may recommend screening at an earlier age if you have risk factors for colon cancer.  Your health care provider may also recommend using home test kits to check for hidden blood in the stool.  A small camera at the end of a tube can be used to examine your colon directly (sigmoidoscopy or colonoscopy). This is done to check for the earliest forms of colorectal cancer.  Routine screening usually begins at age 41.  Direct examination of the colon should be repeated every 5-10 years through 52 years of age. However, you may need to be screened more often if early forms of precancerous polyps or small growths are found. Skin Cancer  Check your skin from head to toe regularly.  Tell your health care provider about any new moles or changes in moles, especially if there is a change in a mole's shape or color.  Also tell your health care provider if you have a mole that is larger than the size of a pencil eraser.  Always use sunscreen. Apply sunscreen liberally and repeatedly throughout the day.  Protect yourself by wearing long sleeves, pants, a wide-brimmed hat, and sunglasses whenever you are outside. HEART DISEASE, DIABETES, AND HIGH BLOOD PRESSURE   Have your blood pressure checked at least every 1-2 years. High blood pressure causes heart disease and increases the risk of stroke.  If you are between 97 years and 6 years  old, ask your health care provider if you should take aspirin to prevent strokes.  Have regular diabetes screenings. This involves taking a blood sample to check your fasting blood sugar level.  If you are at a normal weight and have a low risk for diabetes, have this test once every three years after 52 years of age.  If you are overweight and have a high risk for diabetes, consider being tested at a younger age or more often. PREVENTING INFECTION  Hepatitis B  If you have a higher risk for hepatitis B, you should be screened for this virus. You are considered at high risk for hepatitis B if:  You were born in a country where hepatitis B is common. Ask your health care provider which countries are considered high risk.  Your parents were born in a high-risk country, and you have not been immunized against hepatitis B (hepatitis B vaccine).  You have HIV or AIDS.  You use needles to inject street drugs.  You live with someone who has hepatitis B.  You have had sex with someone who has hepatitis B.  You get hemodialysis treatment.  You take certain medicines for conditions, including cancer, organ transplantation, and autoimmune conditions. Hepatitis C  Blood testing is recommended for:  Everyone born from [birth year] through 1963.  Anyone with known risk factors for hepatitis C. Sexually transmitted infections (STIs)  You should be screened for sexually transmitted infections (STIs) including gonorrhea and chlamydia if:  You are sexually active and are younger than 52 years of age.  You are older than 52 years of age and your health care provider tells you that you are at risk for this type of infection.  Your sexual activity has changed since you were last screened and you are at an increased risk for chlamydia or gonorrhea. Ask your health care provider if  you are at risk.  If you do not have HIV, but are at risk, it may be recommended that you take a prescription medicine  daily to prevent HIV infection. This is called pre-exposure prophylaxis (PrEP). You are considered at risk if:  You are sexually active and do not regularly use condoms or know the HIV status of your partner(s).  You take drugs by injection.  You are sexually active with a partner who has HIV. Talk with your health care provider about whether you are at high risk of being infected with HIV. If you choose to begin PrEP, you should first be tested for HIV. You should then be tested every 3 months for as long as you are taking PrEP.  PREGNANCY   If you are premenopausal and you may become pregnant, ask your health care provider about preconception counseling.  If you may become pregnant, take 400 to 800 micrograms (mcg) of folic acid every day.  If you want to prevent pregnancy, talk to your health care provider about birth control (contraception). OSTEOPOROSIS AND MENOPAUSE   Osteoporosis is a disease in which the bones lose minerals and strength with aging. This can result in serious bone fractures. Your risk for osteoporosis can be identified using a bone density scan.  If you are 89 years of age or older, or if you are at risk for osteoporosis and fractures, ask your health care provider if you should be screened.  Ask your health care provider whether you should take a calcium or vitamin D supplement to lower your risk for osteoporosis.  Menopause may have certain physical symptoms and risks.  Hormone replacement therapy may reduce some of these symptoms and risks. Talk to your health care provider about whether hormone replacement therapy is right for you.  HOME CARE INSTRUCTIONS   Schedule regular health, dental, and eye exams.  Stay current with your immunizations.   Do not use any tobacco products including cigarettes, chewing tobacco, or electronic cigarettes.  If you are pregnant, do not drink alcohol.  If you are breastfeeding, limit how much and how often you drink  alcohol.  Limit alcohol intake to no more than 1 drink per day for nonpregnant women. One drink equals 12 ounces of beer, 5 ounces of wine, or 1 ounces of hard liquor.  Do not use street drugs.  Do not share needles.  Ask your health care provider for help if you need support or information about quitting drugs.  Tell your health care provider if you often feel depressed.  Tell your health care provider if you have ever been abused or do not feel safe at home. Document Released: 06/18/2011 Document Revised: 04/19/2014 Document Reviewed: 11/04/2013 [facility] Patient Information 2015 [place], [place]. This information is not intended to replace advice given to you by your health care provider. Make sure you discuss any questions you have with your health care provider.

## 2015-06-22 NOTE — Assessment & Plan Note (Signed)
Etiology unclear, for cxr, suspect msk , for nsaid/muscle relaxer prn

## 2015-06-22 NOTE — Telephone Encounter (Signed)
[facility]; also had Medicare a and b; confirmed coverage.  Smart set in error this am and did not order;  Will attempt to re-order mammogram to be done at [facility] at the patient's request at [place] completed; The patient contacted  1. Educated the patient regarding ABN and that she may be asked to sign this when she goes in for mammogram, but otherwise, [facility] 2. Confirmed telephone number for the smoking cessation class at [facility];  3. Asked the patient to call an optometrist to schedule for vision exam and the ask about copay with medicare for this screening. The patient can call back for difficulty in obtaining this exam. 4. Discussed Advanced Directives and that i would fup with her in a month to schedule for apt if she needed to discuss 5. Also discussed guidelines for PCV 23; discussed smoking risk; will refer to Dr. Jenny Reichmann as this visit was consumed with new issues and other screenings; the patient aware of need and will discuss with pulmonology or Dr. Jenny Reichmann during her next visit.

## 2015-06-22 NOTE — Assessment & Plan Note (Signed)
stable overall by history and exam, recent data reviewed with pt, and pt to continue medical treatment as before,  to f/u any worsening symptoms or concerns BP Readings from Last 3 Encounters:  06/22/15 118/84  04/12/15 108/76  04/05/15 136/74

## 2015-06-22 NOTE — Assessment & Plan Note (Signed)
?   Etiology, stillfness on ly on standing, , not related to recurring lbp per pt, suspect msk, for tizanidine prn, sport med referral per pt request,  to f/u any worsening symptoms or concerns

## 2015-06-24 ENCOUNTER — Telehealth: Payer: Self-pay

## 2015-06-24 DIAGNOSIS — Z1231 Encounter for screening mammogram for malignant neoplasm of breast: Secondary | ICD-10-CM

## 2015-06-24 DIAGNOSIS — Z0000 Encounter for general adult medical examination without abnormal findings: Secondary | ICD-10-CM

## 2015-06-24 NOTE — Telephone Encounter (Signed)
done

## 2015-06-24 NOTE — Telephone Encounter (Signed)
[facility] needs order for mammogram changed to Bilateral Screening not Diagnostic

## 2015-07-01 ENCOUNTER — Telehealth: Payer: Self-pay | Admitting: Internal Medicine

## 2015-07-01 NOTE — Telephone Encounter (Signed)
Patient has scheduled Saturday clinic appt to see dr Tamala Julian at 11:00am

## 2015-07-01 NOTE — Telephone Encounter (Signed)
Can you please advise in dr Gwynn Burly absence---thanks

## 2015-07-01 NOTE — Telephone Encounter (Signed)
Patient states Dr. Jenny Reichmann usually prescribed her prednisone when she needs it for itching.  She states she would like before Tuesday.  Patient does not want to schedule an appointment to come in.

## 2015-07-01 NOTE — Telephone Encounter (Signed)
Would need visit to evaluate. Prednisone is not a medication that is safe to be on long term and alternatives would likely be better. She can wait for PCP return on Tuesday for second opinion.

## 2015-07-02 ENCOUNTER — Encounter: Payer: Self-pay | Admitting: Family Medicine

## 2015-07-02 ENCOUNTER — Ambulatory Visit (INDEPENDENT_AMBULATORY_CARE_PROVIDER_SITE_OTHER): Payer: Medicare Other | Admitting: Family Medicine

## 2015-07-02 VITALS — BP 110/80 | HR 87 | Temp 98.4°F | Ht 63.0 in | Wt 174.5 lb

## 2015-07-02 DIAGNOSIS — J438 Other emphysema: Secondary | ICD-10-CM

## 2015-07-02 MED ORDER — MONTELUKAST SODIUM 10 MG PO TABS: 10.0000 mg | ORAL_TABLET | Freq: Every day | ORAL | Status: DC

## 2015-07-02 MED ORDER — PREDNISONE 50 MG PO TABS: 50.0000 mg | ORAL_TABLET | Freq: Every day | ORAL | Status: DC

## 2015-07-02 NOTE — Assessment & Plan Note (Signed)
Concern with patient's history of COPD as well as asthma and allergic rhinitis patient may be having an exacerbation. Patient given some prednisone at this time we discussed previously when he uses few and far between. Patient also given a prescription for Singulair which I think will be beneficial in this individual. We discussed continuing the albuterol but doing it more regularly over the course the next 24 hours. Patient and will follow-up with pulmonology if she does not seem to improve or will seek medical attention immediately if she is having more difficulty

## 2015-07-02 NOTE — Patient Instructions (Signed)
Good to meet you Singulair daily can help Take prednisone daily for 5 days if you need it.  Albuterol every 4 hours for 24 hours then as needed Follow up with pulmonology if not better Tuesday Seek medical attention if worsening symptoms.

## 2015-07-02 NOTE — Progress Notes (Signed)
[name] [address] [place], [place] [address] Phone: [phone] Subjective:     CC: Allergies  [name] is a 54 y.o. female coming in with complaint of allergies. She has a past medical history significant for COPD and did have an exacerbation back in April. Patient states unfortunately her allergy seems to be worse. Patient is not taking any of the lamina histamines at this time. Patient is trying to use her inhaler on a fairly regular basis but sometimes forgets. Patient states that she notices some mild tightness in her chest. Patient knows that usually prednisone seems to be helpful.     Past Medical History  Diagnosis Date  . Asthma   . Anxiety   . COPD (chronic obstructive pulmonary disease)   . Angina   . Depression   . Hypertension   . GERD (gastroesophageal reflux disease)   . Arthritis   . Peripheral vascular disease   . Migraine   . COPD 08/24/2009    Qualifier: Diagnosis of  By: Burnett Kanaris    . Prediabetes 02/23/2014  . HYPERTENSION 05/12/2009    Qualifier: Diagnosis of  By: Lenn Cal Deborra Medina), Wynona Canes    . 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     . ALLERGIC RHINITIS 05/12/2009    Qualifier: Diagnosis of  By: Lenn Cal Deborra Medina), Wynona Canes    . Fibromyalgia 05/14/2014  . Anemia, iron deficiency 12/22/2014   Past Surgical History  Procedure Laterality Date  . Neck surgery    . Toe surgery    . Abdominal hysterectomy    . Tubal ligation    . Kidney stone surgery    . Hemorrhoid surgery    . Incise and drain abcess    . Back surgery    . Diagnostic laparoscopy    . Colonoscopy  12/20/2011    Procedure: COLONOSCOPY;  Surgeon: Landry Dyke, MD;  Location: [facility];  Service: Endoscopy;  Laterality: N/A;  . Colonoscopy  03/05/2012    Procedure: COLONOSCOPY;  Surgeon: Landry Dyke, MD;  Location: [facility];  Service: Endoscopy;  Laterality: N/A;   History  Substance Use Topics  . Smoking status: Former Smoker -- 0.50 packs/day for 36 years    Types: Cigarettes    Quit date: 04/05/2015  . Smokeless tobacco: Never Used     Comment: Started back again end of June;   . Alcohol Use: No     Comment: drinks red wine occasionally   Allergies  Allergen Reactions  . Sulfa Antibiotics Nausea And Vomiting  . Sulfonamide Derivatives Nausea And Vomiting    REACTION: n/v   Family History  Problem Relation Age of Onset  . Diabetes Mother   . COPD Mother   . Heart disease Mother   . Diabetes Father   . Kidney disease Father   . Cancer Father   . Anesthesia problems Father   . Diabetes Sister   . Hypertension Sister   . Diabetes Brother   . Sleep apnea Brother   . Heart disease Sister   . Diabetes Sister   . Heart disease Brother      Past medical history, social, surgical and family history all reviewed in electronic medical record.   Review of Systems: No headache, visual changes, nausea, vomiting, diarrhea, constipation, dizziness, abdominal pain,  skin rash, fevers, chills, night sweats, weight loss, swollen lymph nodes, body aches, joint swelling, muscle aches, chest pain, shortness of breath, mood changes.   Objective Blood pressure 110/80, pulse 87, temperature 98.4 F (36.9 C), temperature source Oral, height 5\' 3"  (1.6 m), weight 174 lb 8 oz (79.153 kg), SpO2 96 %.  General: No apparent distress alert and oriented x3 mood and affect normal, dressed appropriately.  HEENT: Pupils equal, extraocular movements intact  Respiratory: Patient's speak in full sentences does have expiratory wheezes throughout with some mild crackles noted. No focal findings noted. No use of accessory muscles. Cardiovascular: No lower extremity edema, non tender, no erythema  Skin: Warm dry intact with no signs of infection or  rash on extremities or on axial skeleton.  Abdomen: Soft nontender  Neuro: Cranial nerves II through XII are intact, neurovascularly intact in all extremities with 2+ DTRs and 2+ pulses.  Lymph: No lymphadenopathy of posterior or anterior cervical chain or axillae bilaterally.  Gait normal with good balance and coordination.      Impression and Recommendations:     This case required medical decision making of moderate complexity.

## 2015-07-04 ENCOUNTER — Ambulatory Visit (INDEPENDENT_AMBULATORY_CARE_PROVIDER_SITE_OTHER): Payer: Medicare Other | Admitting: Licensed Clinical Social Worker

## 2015-07-04 DIAGNOSIS — F332 Major depressive disorder, recurrent severe without psychotic features: Secondary | ICD-10-CM | POA: Diagnosis not present

## 2015-07-17 ENCOUNTER — Other Ambulatory Visit: Payer: Self-pay | Admitting: Internal Medicine

## 2015-07-20 ENCOUNTER — Ambulatory Visit: Payer: Medicare Other | Admitting: Family Medicine

## 2015-07-21 ENCOUNTER — Ambulatory Visit
Admission: RE | Admit: 2015-07-21 | Discharge: 2015-07-21 | Disposition: A | Discharge status: Home / Self Care | Payer: Medicare Other | Source: Ambulatory Visit | Attending: Internal Medicine | Admitting: Internal Medicine

## 2015-07-21 DIAGNOSIS — Z1231 Encounter for screening mammogram for malignant neoplasm of breast: Secondary | ICD-10-CM

## 2015-08-01 ENCOUNTER — Telehealth: Payer: Self-pay | Admitting: Internal Medicine

## 2015-08-01 ENCOUNTER — Ambulatory Visit (INDEPENDENT_AMBULATORY_CARE_PROVIDER_SITE_OTHER): Payer: Medicare Other | Admitting: Pulmonary Disease

## 2015-08-01 ENCOUNTER — Ambulatory Visit (INDEPENDENT_AMBULATORY_CARE_PROVIDER_SITE_OTHER)
Admission: RE | Admit: 2015-08-01 | Discharge: 2015-08-01 | Disposition: A | Discharge status: Home / Self Care | Payer: Medicare Other | Source: Ambulatory Visit | Attending: Pulmonary Disease | Admitting: Pulmonary Disease

## 2015-08-01 ENCOUNTER — Telehealth: Payer: Self-pay | Admitting: Pulmonary Disease

## 2015-08-01 ENCOUNTER — Encounter: Payer: Self-pay | Admitting: Pulmonary Disease

## 2015-08-01 VITALS — BP 126/86 | HR 85 | Temp 98.3°F | Ht 63.0 in | Wt 172.0 lb

## 2015-08-01 DIAGNOSIS — R05 Cough: Secondary | ICD-10-CM | POA: Diagnosis not present

## 2015-08-01 DIAGNOSIS — R079 Chest pain, unspecified: Secondary | ICD-10-CM | POA: Diagnosis not present

## 2015-08-01 DIAGNOSIS — J309 Allergic rhinitis, unspecified: Secondary | ICD-10-CM

## 2015-08-01 DIAGNOSIS — J441 Chronic obstructive pulmonary disease with (acute) exacerbation: Secondary | ICD-10-CM

## 2015-08-01 MED ORDER — PREDNISONE 10 MG PO TABS: ORAL_TABLET | ORAL | Status: DC

## 2015-08-01 MED ORDER — MOXIFLOXACIN HCL 400 MG PO TABS: 400.0000 mg | ORAL_TABLET | Freq: Every day | ORAL | Status: DC

## 2015-08-01 NOTE — Telephone Encounter (Signed)
Patient is requesting a provider referral for an allergist. She has swollen eyes and pain in her ears. She states that this has been going on for years. Please advise the patient.

## 2015-08-01 NOTE — Progress Notes (Signed)
   Subjective:    Patient ID: [name], female    DOB: March 04, 1961, 54 y.o.   MRN: [id number]  Cough Associated symptoms include ear pain, eye redness, headaches, postnasal drip, a rash, rhinorrhea, shortness of breath and wheezing. Pertinent negatives include no fever or sore throat.   [name] is here as an acute visit for congestion, dyspnea, wheezing since last Thursday. Her chief complaints are cough with greenish sputum. No fevers or chills, no sick ocntacts She is taking mucinex over the counter with no effect.  Last cigarette was  in 2 weeks..   Review of Systems  Constitutional: Negative for fever and unexpected weight change.  HENT: Positive for congestion, ear pain, postnasal drip, rhinorrhea, sinus pressure and sneezing. Negative for dental problem, nosebleeds, sore throat and trouble swallowing.   Eyes: Positive for redness and itching.  Respiratory: Positive for cough, chest tightness, shortness of breath and wheezing.   Cardiovascular: Positive for palpitations. Negative for leg swelling.  Gastrointestinal: Negative for nausea and vomiting.  Genitourinary: Negative for dysuria.  Musculoskeletal: Negative for joint swelling.  Skin: Positive for rash.  Neurological: Positive for headaches.  Hematological: Does not bruise/bleed easily.  Psychiatric/Behavioral: Negative for dysphoric mood. The patient is nervous/anxious.        Objective:   Physical Exam  Constitutional: She is oriented to person, place, and time. She appears well-developed and well-nourished. She appears distressed.  HENT:  Mouth/Throat: Oropharynx is clear and moist.  Eyes: Conjunctivae and EOM are normal. Pupils are equal, round, and reactive to light.  Neck: Normal range of motion. Neck supple.  Cardiovascular: Normal rate, regular rhythm and normal heart sounds.   Pulmonary/Chest: Effort normal. She has wheezes. She has no rales. She exhibits no tenderness.  Abdominal: Soft. She  exhibits no distension. There is no tenderness. There is no guarding.  Musculoskeletal: She exhibits no edema.  Neurological: She is alert and oriented to person, place, and time.  Skin: Skin is warm and dry.     Assessment & Plan:  54 Y/O with COPD, asthma comes in with an exacerbation, bronchitis, dyspnea. She has frequent exacerbations over the past year that have been treated with antibiotics and steroids. She is still smoking but states that she does it only occasionally. She is compliant with her inhalers.  Plan: - Chest X ray today - Prednisone taper. Starting at 50 mg qd over 7 days - Moxifloxacin for 7 days - Encourage to quit smoking - Continue Spiriva and Dulera  Return to clinic in 1 month.  Felicia Garfinkel MD [facility] Pager [phone] If no answer or after 3pm call: [phone] 08/01/2015, 2:28 PM

## 2015-08-01 NOTE — Telephone Encounter (Signed)
lmtcb X1 for pt  

## 2015-08-01 NOTE — Patient Instructions (Signed)
Please get chest X ray today. Prednisone taper starting at 50 mg for 7 days Moxifloxacin antibiotics for 7 days  Return to clinic in 1 month.

## 2015-08-02 NOTE — Telephone Encounter (Signed)
lmomtcb x 2  

## 2015-08-02 NOTE — Telephone Encounter (Signed)
Referral done

## 2015-08-02 NOTE — Telephone Encounter (Signed)
Pt advised.

## 2015-08-03 ENCOUNTER — Other Ambulatory Visit: Payer: Self-pay | Admitting: Pulmonary Disease

## 2015-08-03 MED ORDER — AMOXICILLIN-POT CLAVULANATE 875-125 MG PO TABS: 1.0000 | ORAL_TABLET | Freq: Two times a day (BID) | ORAL | Status: DC

## 2015-08-03 NOTE — Telephone Encounter (Signed)
Left detailed message on voicemail advising patient that new antibiotic has been sent to pharmacy. Nothing further needed.

## 2015-08-03 NOTE — Telephone Encounter (Signed)
I put in an order for Augmentin for 5 days

## 2015-08-03 NOTE — Telephone Encounter (Signed)
Called and spoke to pt. Pt states the Avelox that was given during OV (on 8.15.16) was too expensive ($104) and is requesting an alternative.   Dr. Vaughan Browner please advise if we can send in another abx. Thanks.   Allergies  Allergen Reactions  . Sulfa Antibiotics Nausea And Vomiting  . Sulfonamide Derivatives Nausea And Vomiting    REACTION: n/v

## 2015-08-18 ENCOUNTER — Telehealth: Payer: Self-pay | Admitting: Emergency Medicine

## 2015-08-18 MED ORDER — DOXYCYCLINE HYCLATE 100 MG PO TABS: 100.0000 mg | ORAL_TABLET | Freq: Two times a day (BID) | ORAL | Status: DC

## 2015-08-18 NOTE — Telephone Encounter (Signed)
Pt saw Dr. Vaughan Browner on 08/01/15: Patient Instructions       Please get chest X ray today. Prednisone taper starting at 50 mg for 7 days Moxifloxacin antibiotics for 7 days  ---  Called spoke with pt. She never took the ABX bc she could not afford it but did take the prednisone. She reports she is still coughing a lot (clear phlem), feeling dizzy. She reports she has taken many OTC cough syrups.  Also pt reports she had to move out of her appt bc it was on the 3rd floor and she was having a hard time breathing. She needs a letter from [facility] stating her diagnosis.    Please advise RB thanks

## 2015-08-18 NOTE — Telephone Encounter (Signed)
Pt aware of recs.  abx sent to approved pharmacy.  Letter typed and placed up front for pickup by pt tomorrow.  Nothing further needed.

## 2015-08-18 NOTE — Telephone Encounter (Signed)
Please order doxy 100 bid x 7 days (generic if possible)  Write a letter to indicate that her diagnosis is severe COPD.

## 2015-09-08 ENCOUNTER — Ambulatory Visit (INDEPENDENT_AMBULATORY_CARE_PROVIDER_SITE_OTHER): Payer: Medicare Other | Admitting: Family Medicine

## 2015-09-08 ENCOUNTER — Encounter: Payer: Self-pay | Admitting: Family Medicine

## 2015-09-08 VITALS — BP 126/78 | HR 74 | Wt 176.0 lb

## 2015-09-08 DIAGNOSIS — M7061 Trochanteric bursitis, right hip: Secondary | ICD-10-CM | POA: Diagnosis not present

## 2015-09-08 DIAGNOSIS — M7062 Trochanteric bursitis, left hip: Secondary | ICD-10-CM | POA: Diagnosis not present

## 2015-09-08 NOTE — Assessment & Plan Note (Signed)
Patient is an bursitis of the hips bilaterally. Patient is on chronic prednisone secondary to her other comorbidities. We discussed the possibility of injection which patient declined. We discussed anti-inflammatory's. Patient will do more hip abductor strengthening was given home exercises. We discussed which activities to doing which was potentially avoid. We discussed proper shoewear. Patient will try topical inflammatory's. Patient come back and see me again in 3-4 weeks for further evaluation.

## 2015-09-08 NOTE — Patient Instructions (Signed)
Good to see you.  Ice 20 minutes 2 times daily. Usually after activity and before bed. pennsaid pinkie amount topically 2 times daily as needed.  Vitamin D 2000 IU daily Exercises 3 times a week.  Exercises on wall.  Heel and butt touching.  Raise leg 6 inches and hold 2 seconds.  Down slow for count of 4 seconds.  1 set of 30 reps daily on both sides.  Good shoes with rigid bottom.  [name], [place] or New balance greater then 700 See me again in 3-4 weeks.

## 2015-09-08 NOTE — Progress Notes (Signed)
[name] [address], [place] [address] Phone: [phone] Subjective:    I'm seeing this patient by the request  of:  Cathlean Cower, MD   CC: bilateral hip pain  [id number] [name] is a 54 y.o. female coming in with complaint of bilateral hip pain. Patient states that the pain is on the lateral aspect of the hips. Denies any groin pain. Denies any radiation down the legs. An states that she has multiple joints that hurt but states that this seems to be giving her more pain. Patient states rolling on either side at night can be painful. Rates the severity of pain a 6 out of 10. Worse after sitting a long amount of time or when she gets up in the morning. Seems to be better with activity. Denies any weakness.  Past Medical History  Diagnosis Date  . Asthma   . Anxiety   . COPD (chronic obstructive pulmonary disease)   . Angina   . Depression   . Hypertension   . GERD (gastroesophageal reflux disease)   . Arthritis   . Peripheral vascular disease   . Migraine   . COPD 08/24/2009    Qualifier: Diagnosis of  By: Burnett Kanaris    . Prediabetes 02/23/2014  . HYPERTENSION 05/12/2009    Qualifier: Diagnosis of  By: Lenn Cal Deborra Medina), Wynona Canes    . 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     . ALLERGIC RHINITIS 05/12/2009    Qualifier: Diagnosis of  By: Lenn Cal Deborra Medina), Wynona Canes    . Fibromyalgia 05/14/2014  . Anemia, iron deficiency 12/22/2014   Past Surgical History  Procedure Laterality Date  . Neck surgery    . Toe surgery    . Abdominal hysterectomy    . Tubal ligation    . Kidney stone surgery    . Hemorrhoid surgery    . Incise and drain abcess    . Back surgery    . Diagnostic laparoscopy    . Colonoscopy  12/20/2011    Procedure: COLONOSCOPY;  Surgeon:  Landry Dyke, MD;  Location: [facility];  Service: Endoscopy;  Laterality: N/A;  . Colonoscopy  03/05/2012    Procedure: COLONOSCOPY;  Surgeon: Landry Dyke, MD;  Location: [facility];  Service: Endoscopy;  Laterality: N/A;   Social History  Substance Use Topics  . Smoking status: Former Smoker -- 0.50 packs/day for 36 years    Types: Cigarettes    Quit date: 07/18/2015  . Smokeless tobacco: Never Used     Comment: Pt states she is "an off and on smoker."    . Alcohol Use: No     Comment: drinks red wine occasionally   Allergies  Allergen Reactions  . Sulfa Antibiotics Nausea And Vomiting  . Sulfonamide Derivatives Nausea And Vomiting    REACTION: n/v   Social History  Substance Use Topics  . Smoking status: Former Smoker -- 0.50 packs/day for 36 years    Types: Cigarettes    Quit date: 07/18/2015  . Smokeless tobacco: Never Used     Comment: Pt states she is "an off and on smoker."    . Alcohol Use: No     Comment: drinks red wine occasionally   Family History  Problem Relation Age of Onset  . Diabetes Mother   .  COPD Mother   . Heart disease Mother   . Diabetes Father   . Kidney disease Father   . Cancer Father   . Anesthesia problems Father   . Diabetes Sister   . Hypertension Sister   . Diabetes Brother   . Sleep apnea Brother   . Heart disease Sister   . Diabetes Sister   . Heart disease Brother         Past medical history, social, surgical and family history all reviewed in electronic medical record.   Review of Systems: No headache, visual changes, nausea, vomiting, diarrhea, constipation, dizziness, abdominal pain, skin rash, fevers, chills, night sweats, weight loss, swollen lymph nodes, body aches, joint swelling, muscle aches, chest pain, shortness of breath, mood changes.   Objective Blood pressure 126/78, pulse 74, weight 176 lb (79.833 kg).  General: No apparent distress alert and oriented x3 mood and affect normal, dressed  appropriately.  HEENT: Pupils equal, extraocular movements intact  Respiratory: Patient's speak in full sentences and does not appear short of breath  Cardiovascular: No lower extremity edema, non tender, no erythema  Skin: Warm dry intact with no signs of infection or rash on extremities or on axial skeleton.  Abdomen: Soft nontender  Neuro: Cranial nerves II through XII are intact, neurovascularly intact in all extremities with 2+ DTRs and 2+ pulses.  Lymph: No lymphadenopathy of posterior or anterior cervical chain or axillae bilaterally.  Gait normal with good balance and coordination.  MSK:  Non tender with full range of motion and good stability and symmetric strength and tone of shoulders, elbows, wrist,  knee and ankles bilaterally. Mild osteoarthritic changes of multiple joints [id number] ROM IR: 15 Deg, ER: 35 Deg, Flexion: 100 Deg, Extension: 80 Deg, Abduction: 45 Deg, Adduction: 45 Deg Strength IR: /5, ER: 5/5, Flexion: 5/5, Extension: 5/5, Abduction: 5/5, Adduction: 5/5 Pelvic alignment unremarkable to inspection and palpation. Standing hip rotation and gait without trendelenburg sign / unsteadiness. Severe tenderness over the greater trochanteric bursitis bilaterally. Positive Faber bilaterally with pain on the lateral aspect of the hips. No SI joint tenderness and normal minimal SI movement. Negative straight leg test bilaterally   Impression and Recommendations:     This case required medical decision making of moderate complexity.

## 2015-09-20 ENCOUNTER — Telehealth: Payer: Self-pay | Admitting: Internal Medicine

## 2015-09-20 ENCOUNTER — Other Ambulatory Visit: Payer: Self-pay | Admitting: *Deleted

## 2015-09-20 MED ORDER — ALBUTEROL SULFATE (5 MG/ML) 0.5% IN NEBU: 2.5000 mg | INHALATION_SOLUTION | Freq: Every evening | RESPIRATORY_TRACT | Status: DC | PRN

## 2015-09-20 MED ORDER — ALBUTEROL SULFATE HFA 108 (90 BASE) MCG/ACT IN AERS: 1.0000 | INHALATION_SPRAY | Freq: Four times a day (QID) | RESPIRATORY_TRACT | Status: DC | PRN

## 2015-09-20 NOTE — Telephone Encounter (Signed)
Patient calling to get refill on Albuterol Neb and Rescue inhaler Rx sent to pharmacy. Patient notified.  No questions or concerns at this time. Nothing further needed.

## 2015-09-21 ENCOUNTER — Ambulatory Visit: Payer: Medicare Other

## 2015-09-28 ENCOUNTER — Encounter: Payer: Self-pay | Admitting: Family Medicine

## 2015-09-28 ENCOUNTER — Ambulatory Visit (INDEPENDENT_AMBULATORY_CARE_PROVIDER_SITE_OTHER): Payer: Medicare Other | Admitting: Family Medicine

## 2015-09-28 ENCOUNTER — Other Ambulatory Visit (INDEPENDENT_AMBULATORY_CARE_PROVIDER_SITE_OTHER): Payer: Medicare Other

## 2015-09-28 VITALS — BP 132/80 | HR 76 | Wt 174.0 lb

## 2015-09-28 DIAGNOSIS — M7061 Trochanteric bursitis, right hip: Secondary | ICD-10-CM

## 2015-09-28 DIAGNOSIS — M7062 Trochanteric bursitis, left hip: Secondary | ICD-10-CM

## 2015-09-28 DIAGNOSIS — M25552 Pain in left hip: Secondary | ICD-10-CM

## 2015-09-28 NOTE — Patient Instructions (Signed)
Good to see you Ice is your friend 2 day break for now.  Then start the exercises again 3 times a week See me again in 4 weeks if not better

## 2015-09-28 NOTE — Assessment & Plan Note (Signed)
Patient given injection today and tolerated the procedure very well. We discussed icing regimen. I do not think lumbar radiculopathy is within the differential at this time with patient resolving pain after the injections. We discussed starting the same regimen and she was doing previously in 48 hours. We discussed icing regimen. Patient will remain active and come back and see me again in 4 weeks for further evaluation and treatment.

## 2015-09-28 NOTE — Progress Notes (Signed)
[name] [address] [place], [place] [address] Phone: [phone] Subjective:    I'm seeing this patient by the request  of:  Cathlean Cower, MD   CC: bilateral hip pain  NIO:[id number] ELNER SEIFERT is a 54 y.o. female coming in with complaint of bilateral hip pain. Patient states that the pain is on the lateral aspect of the hips.   Patient was found to have more of a greater trochanteric bursitis. Patient elected try conservative therapy including home exercises, icing, as well as some topical anti-inflammatories. Patient states she is only a proximal only 10% better. States that she has been following directions trying to remain active. Patient even Redder shoes but has not notice any significant improvement. Continues to have pain enough that does wake her up at night. Rates the severity is 6 out of 10 still.  Past Medical History  Diagnosis Date  . Asthma   . Anxiety   . COPD (chronic obstructive pulmonary disease)   . Angina   . Depression   . Hypertension   . GERD (gastroesophageal reflux disease)   . Arthritis   . Peripheral vascular disease   . Migraine   . COPD 08/24/2009    Qualifier: Diagnosis of  By: Burnett Kanaris    . Prediabetes 02/23/2014  . HYPERTENSION 05/12/2009    Qualifier: Diagnosis of  By: Lenn Cal Deborra Medina), Wynona Canes    . 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     . ALLERGIC RHINITIS 05/12/2009    Qualifier: Diagnosis of  By: Lenn Cal Deborra Medina), Wynona Canes    . Fibromyalgia 05/14/2014  . Anemia, iron deficiency 12/22/2014   Past Surgical History  Procedure Laterality Date  . Neck surgery    . Toe surgery    . Abdominal hysterectomy    . Tubal ligation    . Kidney stone surgery    . Hemorrhoid surgery    . Incise and drain abcess    . Back surgery    .  Diagnostic laparoscopy    . Colonoscopy  12/20/2011    Procedure: COLONOSCOPY;  Surgeon: Landry Dyke, MD;  Location: [facility];  Service: Endoscopy;  Laterality: N/A;  . Colonoscopy  03/05/2012    Procedure: COLONOSCOPY;  Surgeon: Landry Dyke, MD;  Location: [facility];  Service: Endoscopy;  Laterality: N/A;   Social History  Substance Use Topics  . Smoking status: Former Smoker -- 0.50 packs/day for 36 years    Types: Cigarettes    Quit date: 07/18/2015  . Smokeless tobacco: Never Used     Comment: Pt states she is "an off and on smoker."    . Alcohol Use: No     Comment: drinks red wine occasionally   Allergies  Allergen Reactions  . Sulfa Antibiotics Nausea And Vomiting  . Sulfonamide Derivatives Nausea And Vomiting    REACTION: n/v   Social History  Substance Use Topics  . Smoking status: Former Smoker -- 0.50 packs/day for 36 years    Types: Cigarettes    Quit date: 07/18/2015  . Smokeless tobacco: Never Used     Comment: Pt states she is "an off and on smoker."    . Alcohol Use: No     Comment: drinks red wine occasionally   Family History  Problem Relation Age of Onset  .  Diabetes Mother   . COPD Mother   . Heart disease Mother   . Diabetes Father   . Kidney disease Father   . Cancer Father   . Anesthesia problems Father   . Diabetes Sister   . Hypertension Sister   . Diabetes Brother   . Sleep apnea Brother   . Heart disease Sister   . Diabetes Sister   . Heart disease Brother         Past medical history, social, surgical and family history all reviewed in electronic medical record.   Review of Systems: No headache, visual changes, nausea, vomiting, diarrhea, constipation, dizziness, abdominal pain, skin rash, fevers, chills, night sweats, weight loss, swollen lymph nodes, body aches, joint swelling, muscle aches, chest pain, shortness of breath, mood changes.   Objective Blood pressure 132/80, pulse 76, weight 174 lb (78.926 kg), SpO2 98  %.  General: No apparent distress alert and oriented x3 mood and affect normal, dressed appropriately.  HEENT: Pupils equal, extraocular movements intact  Respiratory: Patient's speak in full sentences and does not appear short of breath  Cardiovascular: No lower extremity edema, non tender, no erythema  Skin: Warm dry intact with no signs of infection or rash on extremities or on axial skeleton.  Abdomen: Soft nontender  Neuro: Cranial nerves II through XII are intact, neurovascularly intact in all extremities with 2+ DTRs and 2+ pulses.  Lymph: No lymphadenopathy of posterior or anterior cervical chain or axillae bilaterally.  Gait normal with good balance and coordination.  MSK:  Non tender with full range of motion and good stability and symmetric strength and tone of shoulders, elbows, wrist,  knee and ankles bilaterally. Mild osteoarthritic changes of multiple joints BTD:[id number] ROM IR: 15 Deg, ER: 35 Deg, Flexion: 100 Deg, Extension: 80 Deg, Abduction: 45 Deg, Adduction: 45 Deg Strength IR: /5, ER: 5/5, Flexion: 5/5, Extension: 5/5, Abduction: 5/5, Adduction: 5/5 Pelvic alignment unremarkable to inspection and palpation. Standing hip rotation and gait without trendelenburg sign / unsteadiness. Severe tenderness over the greater trochanteric bursitis bilaterally. Positive Faber bilaterally with pain on the lateral aspect of the hips. No SI joint tenderness and normal minimal SI movement. Negative straight leg test bilaterally No change from previous exam   Procedure: Real-time Ultrasound Guided Injection of right greater trochanteric bursitis secondary to patient's body habitus Device: GE Logiq E  Ultrasound guided injection is preferred based studies that show increased duration, increased effect, greater accuracy, decreased procedural pain, increased response rate, and decreased cost with ultrasound guided versus blind injection.  Verbal informed consent obtained.  Time-out  conducted.  Noted no overlying erythema, induration, or other signs of local infection.  Skin prepped in a sterile fashion.  Local anesthesia: Topical Ethyl chloride.  With sterile technique and under real time ultrasound guidance:  Greater trochanteric area was visualized and patient's bursa was noted. A 22-gauge 3 inch needle was inserted and 4 cc of 0.5% Marcaine and 1 cc of Kenalog 40 mg/dL was injected. Pictures taken Completed without difficulty  Pain immediately resolved suggesting accurate placement of the medication.  Advised to call if fevers/chills, erythema, induration, drainage, or persistent bleeding.  Images permanently stored and available for review in the ultrasound unit.  Impression: Technically successful ultrasound guided injection.   Procedure: Real-time Ultrasound Guided Injection of left  greater trochanteric bursitis secondary to patient's body habitus Device: GE Logiq E  Ultrasound guided injection is preferred based studies that show increased duration, increased effect, greater accuracy, decreased procedural pain, increased  response rate, and decreased cost with ultrasound guided versus blind injection.  Verbal informed consent obtained.  Time-out conducted.  Noted no overlying erythema, induration, or other signs of local infection.  Skin prepped in a sterile fashion.  Local anesthesia: Topical Ethyl chloride.  With sterile technique and under real time ultrasound guidance:  Greater trochanteric area was visualized and patient's bursa was noted. A 22-gauge 3 inch needle was inserted and 4 cc of 0.5% Marcaine and 1 cc of Kenalog 40 mg/dL was injected. Pictures taken Completed without difficulty  Pain immediately resolved suggesting accurate placement of the medication.  Advised to call if fevers/chills, erythema, induration, drainage, or persistent bleeding.  Images permanently stored and available for review in the ultrasound unit.  Impression: Technically  successful ultrasound guided injection.   Impression and Recommendations:     This case required medical decision making of moderate complexity.

## 2015-10-13 ENCOUNTER — Ambulatory Visit (INDEPENDENT_AMBULATORY_CARE_PROVIDER_SITE_OTHER): Payer: Medicare Other | Admitting: Adult Health

## 2015-10-13 ENCOUNTER — Encounter: Payer: Self-pay | Admitting: Adult Health

## 2015-10-13 VITALS — BP 106/80 | HR 73 | Temp 98.5°F | Ht 63.0 in | Wt 169.0 lb

## 2015-10-13 DIAGNOSIS — J441 Chronic obstructive pulmonary disease with (acute) exacerbation: Secondary | ICD-10-CM | POA: Diagnosis not present

## 2015-10-13 MED ORDER — PROMETHAZINE-CODEINE 6.25-10 MG/5ML PO SYRP: 5.0000 mL | ORAL_SOLUTION | Freq: Four times a day (QID) | ORAL | Status: DC | PRN

## 2015-10-13 MED ORDER — PREDNISONE 10 MG PO TABS
ORAL_TABLET | ORAL | Status: DC
Start: 2015-10-13 — End: 2015-12-02

## 2015-10-13 MED ORDER — AMOXICILLIN-POT CLAVULANATE 875-125 MG PO TABS: 1.0000 | ORAL_TABLET | Freq: Two times a day (BID) | ORAL | Status: AC

## 2015-10-13 NOTE — Assessment & Plan Note (Signed)
Flare with sinusitis   Plan  Augmentin 875mg  Twice daily  For 10 days  Prednisone taper over next week Phenergan with Codeine syrup 1 tsp every 6hr as needed for cough, may make you sleepy  Mucinex DM Twice daily  As needed  Cough/congestion  Great job on not smoking ,  Follow up Dr. Lamonte Sakai in  3 months  and as needed. Please contact office for sooner follow up if symptoms do not improve or worsen or seek emergency care

## 2015-10-13 NOTE — Patient Instructions (Signed)
Augmentin 875mg  Twice daily  For 10 days  Prednisone taper over next week Phenergan with Codeine syrup 1 tsp every 6hr as needed for cough, may make you sleepy  Mucinex DM Twice daily  As needed  Cough/congestion  Great job on not smoking ,  Follow up Dr. Lamonte Sakai in  3 months  and as needed. Please contact office for sooner follow up if symptoms do not improve or worsen or seek emergency care

## 2015-10-13 NOTE — Progress Notes (Signed)
   Subjective:    Patient ID: [name], female    DOB: August 11, 1961, 54 y.o.   MRN: [id number]  HPI  54 yo female with  severe AFL, COPD , former smoker-07/2015    10/13/2015 Acute OV : COPD  Pt presents for an acute office visit.  Patient complains over the last week, that she's had increased productive cough with thick green mucus, nasal congestion, productive cough and sinus pain and pressure. Patient says she coughs so hard that she feels lightheaded at times. She denies any fever, nausea, vomiting, hemoptysis, orthopnea, PND or leg swelling. Patient remains on Dulera and Spiriva Chest x-ray done in August w/ no acute process.     Review of Systems  Constitutional:   No  weight loss, night sweats,  Fevers, chills, fatigue, or  lassitude.  HEENT:   No headaches,  Difficulty swallowing,  Tooth/dental problems, or  Sore throat,                No sneezing, itching, ear ache,  +nasal congestion, post nasal drip,   CV:  No chest pain,  Orthopnea, PND, swelling in lower extremities, anasarca, dizziness, palpitations, syncope.   GI  No heartburn, indigestion, abdominal pain, nausea, vomiting, diarrhea, change in bowel habits, loss of appetite, bloody stools.   Resp:    No chest wall deformity  Skin: no rash or lesions.  GU: no dysuria, change in color of urine, no urgency or frequency.  No flank pain, no hematuria   MS:  No joint pain or swelling.  No decreased range of motion.  No back pain.  Psych:  No change in mood or affect. No depression or anxiety.  No memory loss.          Objective:   Physical Exam GEN: A/Ox3; pleasant ,   HEENT:  [place]/AT,  EACs-clear, TMs-wnl, NOSE-clear, THROAT-clear drainage , max tenderness , no lesions, no postnasal drip or exudate noted.   NECK:  Supple w/ fair ROM; no JVD; normal carotid impulses w/o bruits; no thyromegaly or nodules palpated; no lymphadenopathy.  RESP  Few exp wheezes  no accessory muscle use, no dullness to  percussion  CARD:  RRR, no m/r/g  , no peripheral edema, pulses intact, no cyanosis or clubbing.  GI:   Soft & nt; nml bowel sounds; no organomegaly or masses detected.  Musco: Warm bil, no deformities or joint swelling noted.   Neuro: alert, no focal deficits noted.    Skin: Warm, no lesions or rashes         Assessment & Plan:

## 2015-10-26 ENCOUNTER — Ambulatory Visit: Payer: Self-pay | Admitting: Family Medicine

## 2015-11-07 DIAGNOSIS — N9089 Other specified noninflammatory disorders of vulva and perineum: Secondary | ICD-10-CM | POA: Diagnosis not present

## 2015-11-07 DIAGNOSIS — N951 Menopausal and female climacteric states: Secondary | ICD-10-CM | POA: Diagnosis not present

## 2015-11-08 DIAGNOSIS — H2513 Age-related nuclear cataract, bilateral: Secondary | ICD-10-CM | POA: Diagnosis not present

## 2015-11-08 DIAGNOSIS — H31092 Other chorioretinal scars, left eye: Secondary | ICD-10-CM | POA: Diagnosis not present

## 2015-12-02 ENCOUNTER — Other Ambulatory Visit: Payer: Medicare Other

## 2015-12-02 ENCOUNTER — Ambulatory Visit (INDEPENDENT_AMBULATORY_CARE_PROVIDER_SITE_OTHER): Payer: Medicare Other | Admitting: Adult Health

## 2015-12-02 ENCOUNTER — Other Ambulatory Visit: Payer: Self-pay | Admitting: Adult Health

## 2015-12-02 ENCOUNTER — Encounter: Payer: Self-pay | Admitting: Adult Health

## 2015-12-02 VITALS — BP 140/82 | HR 62 | Ht 63.0 in | Wt 172.4 lb

## 2015-12-02 DIAGNOSIS — Z23 Encounter for immunization: Secondary | ICD-10-CM | POA: Diagnosis not present

## 2015-12-02 DIAGNOSIS — J449 Chronic obstructive pulmonary disease, unspecified: Secondary | ICD-10-CM

## 2015-12-02 MED ORDER — MOMETASONE FURO-FORMOTEROL FUM 200-5 MCG/ACT IN AERO: 2.0000 | INHALATION_SPRAY | Freq: Two times a day (BID) | RESPIRATORY_TRACT | Status: DC

## 2015-12-02 MED ORDER — TIOTROPIUM BROMIDE MONOHYDRATE 18 MCG IN CAPS: 18.0000 ug | ORAL_CAPSULE | Freq: Every day | RESPIRATORY_TRACT | Status: DC

## 2015-12-02 NOTE — Addendum Note (Signed)
Addended by: Levander Campion on: 12/02/2015 03:41 PM   Modules accepted: Orders

## 2015-12-02 NOTE — Patient Instructions (Signed)
Continue on Spiriva and Dulera  Work on not smoking  Labs today  Pneumovax today .  Follow up Dr. Lamonte Sakai  In 3 months and As needed

## 2015-12-02 NOTE — Progress Notes (Signed)
Subjective:    Patient ID: [name], female    DOB: [birth year], 54 y.o.   MRN: [id number]  HPI   54 yo female with  severe AFL, COPD , former smoker-07/2015   TEST  PFT August 2014 with an post BD FEV1 at 46%, ratio 53, mild bronchodilator response, FVC 70%, DLCO 59%.  12/02/2015 Follow up  OV : COPD  Pt presents for a follow up . Applying for disability . Is unable to work.  Gets sob with minimal activity . Can not walk long distance. Can not lift over 10lbs without sob.  Patient remains on Dulera and Spiriva Chest x-ray done in August w/ no acute process.  Flu shot utd.  Still smokes . Discussed smoking cessation.  Family hx of COPD -mom, sibling. Discussed alpha one testing.     Past Medical History  Diagnosis Date  . Asthma   . Anxiety   . COPD (chronic obstructive pulmonary disease) ([place])   . Angina   . Depression   . Hypertension   . GERD (gastroesophageal reflux disease)   . Arthritis   . Peripheral vascular disease ([place])   . Migraine   . COPD 08/24/2009    Qualifier: Diagnosis of  By: Burnett Kanaris    . Prediabetes 02/23/2014  . HYPERTENSION 05/12/2009    Qualifier: Diagnosis of  By: Lenn Cal Deborra Medina), Wynona Canes    . 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     . ALLERGIC RHINITIS 05/12/2009    Qualifier: Diagnosis of  By: Lenn Cal Deborra Medina), Wynona Canes    . Fibromyalgia 05/14/2014  . Anemia, iron deficiency 12/22/2014   Current Outpatient Prescriptions on File Prior to Visit  Medication Sig Dispense Refill  . albuterol (PROVENTIL HFA;VENTOLIN HFA) 108 (90 BASE) MCG/ACT inhaler Inhale 1-2 puffs into the lungs every 6 (six) hours as needed for wheezing. 8 g 5  . albuterol (PROVENTIL) (5 MG/ML) 0.5% nebulizer solution Take 0.5 mLs (2.5 mg total) by nebulization at bedtime as needed. As needed for  shortness of breath 20 mL 3  . cyclobenzaprine (FLEXERIL) 5 MG tablet Take 1 tablet (5 mg total) by mouth 3 (three) times daily as needed for muscle spasms. 30 tablet 0  . diphenhydrAMINE (BENADRYL) 25 MG tablet Take 25 mg by mouth every 6 (six) hours as needed.    Marland Kitchen FLUoxetine (PROZAC) 20 MG capsule TAKE ONE CAPSULE BY MOUTH EVERY DAY 90 capsule 3  . fluticasone (FLONASE) 50 MCG/ACT nasal spray Place 2 sprays into both nostrils 2 (two) times daily. 16 g 2  . hydrOXYzine (ATARAX/VISTARIL) 25 MG tablet TAKE 1 TABLET BY MOUTH THREE TIMES DAILY AS NEEDED 30 tablet 0  . irbesartan-hydrochlorothiazide (AVALIDE) 150-12.5 MG per tablet Take 1 tablet by mouth daily. 90 tablet 3  . meloxicam (MOBIC) 15 MG tablet Take 1 tablet (15 mg total) by mouth daily. 90 tablet 3  . mometasone-formoterol (DULERA) 200-5 MCG/ACT AERO Inhale 2 puffs into the lungs 2 (two) times daily. 1 Inhaler 3  . montelukast (SINGULAIR) 10 MG tablet Take 1 tablet (10 mg total) by mouth at bedtime. 90 tablet 3  . NONFORMULARY OR COMPOUNDED ITEM Estradiol 0.02 % 47ml prefilled applicator Sig: apply twice a week 24 each 4  . omeprazole (PRILOSEC) 20 MG capsule Take 1 capsule (20 mg total) by mouth daily. 30 capsule 3  .  tiotropium (SPIRIVA HANDIHALER) 18 MCG inhalation capsule Place 1 capsule (18 mcg total) into inhaler and inhale daily. 30 capsule 5  . tiZANidine (ZANAFLEX) 4 MG tablet Take 1 tablet (4 mg total) by mouth every 6 (six) hours as needed for muscle spasms. 60 tablet 2  . traMADol (ULTRAM) 50 MG tablet Take 1 tablet (50 mg total) by mouth every 6 (six) hours as needed. For pain. 30 tablet 3   No current facility-administered medications on file prior to visit.       Review of Systems   Constitutional:   No  weight loss, night sweats,  Fevers, chills, fatigue, or  lassitude.  HEENT:   No headaches,  Difficulty swallowing,  Tooth/dental problems, or  Sore throat,                No sneezing, itching, ear ache, nasal  congestion, post nasal drip,   CV:  No chest pain,  Orthopnea, PND, swelling in lower extremities, anasarca, dizziness, palpitations, syncope.   GI  No heartburn, indigestion, abdominal pain, nausea, vomiting, diarrhea, change in bowel habits, loss of appetite, bloody stools.   Resp:    No chest wall deformity  Skin: no rash or lesions.  GU: no dysuria, change in color of urine, no urgency or frequency.  No flank pain, no hematuria   MS:  No joint pain or swelling.  No decreased range of motion.  No back pain.  Psych:  No change in mood or affect. No depression or anxiety.  No memory loss.          Objective:   Physical Exam  GEN: A/Ox3; pleasant ,   HEENT:  [place]/AT,  EACs-clear, TMs-wnl, NOSE-clear, THROAT-clear drainage , max tenderness , no lesions, no postnasal drip or exudate noted.   NECK:  Supple w/ fair ROM; no JVD; normal carotid impulses w/o bruits; no thyromegaly or nodules palpated; no lymphadenopathy.  RESP  CTA bilaterally , no accessory muscle use, no dullness to percussion  CARD:  RRR, no m/r/g  , no peripheral edema, pulses intact, no cyanosis or clubbing.  GI:   Soft & nt; nml bowel sounds; no organomegaly or masses detected.  Musco: Warm bil, no deformities or joint swelling noted.   Neuro: alert, no focal deficits noted.    Skin: Warm, no lesions or rashes         Assessment & Plan:

## 2015-12-02 NOTE — Addendum Note (Signed)
Addended by: Levander Campion on: 12/02/2015 03:25 PM   Modules accepted: Orders

## 2015-12-02 NOTE — Assessment & Plan Note (Addendum)
Compensated on present regimen  Check alpha one  Smoking cessation   Plan  Continue on Spiriva and Dulera  Work on not smoking  Labs today  Pneumovax today .  Follow up Dr. Lamonte Sakai  In 3 months and As needed

## 2015-12-07 ENCOUNTER — Telehealth: Payer: Self-pay | Admitting: *Deleted

## 2015-12-07 ENCOUNTER — Telehealth: Payer: Self-pay | Admitting: Emergency Medicine

## 2015-12-07 MED ORDER — HYDROXYZINE PAMOATE 25 MG PO CAPS: 25.0000 mg | ORAL_CAPSULE | Freq: Three times a day (TID) | ORAL | Status: DC | PRN

## 2015-12-07 NOTE — Telephone Encounter (Signed)
Spoke with pt. States that she has been having lots of itching. This is not coming from one of her inhalers. Advised her that she will need to contact her PCP about this. She agreed and verbalized understanding. Nothing further was needed.

## 2015-12-07 NOTE — Telephone Encounter (Signed)
Notified pt with md response. There wasn't any appt available offered appt with Dr. Ronnald Ramp for tomorrow, but she states she will go to [facility] urgent care so she can get a shot. She itching all over, private area too. She does have a rash as well...[name]

## 2015-12-07 NOTE — Telephone Encounter (Signed)
Left msg on triage stating she is itching all over her body currently using benadryl but it's not helping...Johny Chess

## 2015-12-07 NOTE — Telephone Encounter (Signed)
If has rash or swelling, please consider ROV  O/w ok for vistaril prn - done erx

## 2015-12-08 LAB — ALPHA-1 ANTITRYPSIN PHENOTYPE: A-1 Antitrypsin: 141 mg/dL (ref 83–199)

## 2015-12-09 NOTE — Progress Notes (Signed)
Quick Note:  Spoke with pt. And discussed alpha-1 results. Pt. Understood. Nothing further needed at this time. ______

## 2015-12-20 ENCOUNTER — Ambulatory Visit (INDEPENDENT_AMBULATORY_CARE_PROVIDER_SITE_OTHER): Payer: Medicare Other | Admitting: Internal Medicine

## 2015-12-20 ENCOUNTER — Encounter: Payer: Self-pay | Admitting: Internal Medicine

## 2015-12-20 ENCOUNTER — Other Ambulatory Visit (INDEPENDENT_AMBULATORY_CARE_PROVIDER_SITE_OTHER): Payer: Medicare Other

## 2015-12-20 VITALS — BP 122/72 | HR 70 | Ht 63.0 in | Wt 169.8 lb

## 2015-12-20 DIAGNOSIS — L509 Urticaria, unspecified: Secondary | ICD-10-CM

## 2015-12-20 LAB — CBC WITH DIFFERENTIAL/PLATELET
Basophils Absolute: 0 10*3/uL (ref 0.0–0.1)
Basophils Relative: 0.5 % (ref 0.0–3.0)
Eosinophils Absolute: 0.2 10*3/uL (ref 0.0–0.7)
Eosinophils Relative: 1.8 % (ref 0.0–5.0)
HCT: 40 % (ref 36.0–46.0)
Hemoglobin: 13 g/dL (ref 12.0–15.0)
Lymphocytes Relative: 29.1 % (ref 12.0–46.0)
Lymphs Abs: 2.7 10*3/uL (ref 0.7–4.0)
MCHC: 32.5 g/dL (ref 30.0–36.0)
MCV: 93.5 fl (ref 78.0–100.0)
Monocytes Absolute: 0.6 10*3/uL (ref 0.1–1.0)
Monocytes Relative: 6.4 % (ref 3.0–12.0)
Neutro Abs: 5.8 10*3/uL (ref 1.4–7.7)
Neutrophils Relative %: 62.2 % (ref 43.0–77.0)
Platelets: 341 10*3/uL (ref 150.0–400.0)
RBC: 4.28 Mil/uL (ref 3.87–5.11)
RDW: 14.1 % (ref 11.5–15.5)
WBC: 9.3 10*3/uL (ref 4.0–10.5)

## 2015-12-20 LAB — SEDIMENTATION RATE: Sed Rate: 15 mm/hr (ref 0–22)

## 2015-12-20 NOTE — Progress Notes (Signed)
12/20/2015-55 year old female followed by Dr. Lamonte Sakai for asthma/COPD Referred for allergy new consultation courtesy of Dr Cathlean Cower; complaining of itch/rash through out upper body during summer time (mainly). Probably starting in her teens, she has had a persistent or very frequently recurring urticarial rash, worst in summer and winter. It resolved for several years then started back again several years ago. She has been treated by her primary physician in dermatology. Benadryl helps but makes her drowsy. Not aware of a generalized latex allergy but use of condom makes her burn and itch. [place] restaurant last week-similar reaction which she blames on a rolled pastry product. Otherwise no specific foods have been identified. No unusual reaction to insect stings, aspirin. Seasonal itching and sneezing especially spring and fall pollen seasons Aware of a diagnosis of asthma/COPD, still smoking one or 2 cigarettes a week  Has not recognized triggers, molds or suspicious exposures in the home environment. Has not been on ACE/ARB medication that she remembers. Not aware of any chronic infection, hepatitis or thyroid disorder. No significant arthritis.  Prior to Admission medications   Medication Sig Start Date End Date Taking? Authorizing Provider  albuterol (PROVENTIL HFA;VENTOLIN HFA) 108 (90 BASE) MCG/ACT inhaler Inhale 1-2 puffs into the lungs every 6 (six) hours as needed for wheezing. 09/20/15  Yes Collene Gobble, MD  albuterol (PROVENTIL) (5 MG/ML) 0.5% nebulizer solution Take 0.5 mLs (2.5 mg total) by nebulization at bedtime as needed. As needed for shortness of breath 09/20/15  Yes Collene Gobble, MD  cholecalciferol (VITAMIN D) 1000 units tablet Take 1,000 Units by mouth daily.   Yes Historical Provider, MD  cyclobenzaprine (FLEXERIL) 5 MG tablet Take 1 tablet (5 mg total) by mouth 3 (three) times daily as needed for muscle spasms. 11/17/13  Yes Thurnell Lose, MD  diphenhydrAMINE (BENADRYL)  25 MG tablet Take 25 mg by mouth every 6 (six) hours as needed.   Yes Historical Provider, MD  DULERA 200-5 MCG/ACT AERO INHALE 2 PUFFS INTO THE LUNGS TWICE DAILY 12/02/15  Yes Tammy S Parrett, NP  FLUoxetine (PROZAC) 20 MG capsule TAKE ONE CAPSULE BY MOUTH EVERY DAY 07/18/15  Yes Biagio Borg, MD  fluticasone [facility]) 50 MCG/ACT nasal spray Place 2 sprays into both nostrils 2 (two) times daily. 04/12/15  Yes Collene Gobble, MD  hydrOXYzine (VISTARIL) 25 MG capsule Take 1 capsule (25 mg total) by mouth 3 (three) times daily as needed. 12/07/15  Yes Biagio Borg, MD  irbesartan-hydrochlorothiazide (AVALIDE) 150-12.5 MG per tablet Take 1 tablet by mouth daily. 11/03/14  Yes Biagio Borg, MD  meloxicam (MOBIC) 15 MG tablet Take 1 tablet (15 mg total) by mouth daily. 06/22/15  Yes Biagio Borg, MD  montelukast (SINGULAIR) 10 MG tablet Take 1 tablet (10 mg total) by mouth at bedtime. 07/02/15  Yes Lyndal Pulley, DO  NONFORMULARY OR COMPOUNDED ITEM Estradiol 0.02 % 47ml prefilled applicator Sig: apply twice a week 07/12/14  Yes Terrance Mass, MD  omeprazole (PRILOSEC) 20 MG capsule Take 1 capsule (20 mg total) by mouth daily. 02/23/14  Yes Tresa Garter, MD  tiotropium (SPIRIVA HANDIHALER) 18 MCG inhalation capsule Place 1 capsule (18 mcg total) into inhaler and inhale daily. 12/02/15  Yes Tammy S Parrett, NP  tiZANidine (ZANAFLEX) 4 MG tablet Take 1 tablet (4 mg total) by mouth every 6 (six) hours as needed for muscle spasms. 06/22/15  Yes Biagio Borg, MD  traMADol (ULTRAM) 50 MG tablet Take 1 tablet (50  mg total) by mouth every 6 (six) hours as needed. For pain. 11/17/13  Yes Thurnell Lose, MD   Past Medical History  Diagnosis Date  . Asthma   . Anxiety   . COPD (chronic obstructive pulmonary disease) ([place])   . Angina   . Depression   . Hypertension   . GERD (gastroesophageal reflux disease)   . Arthritis   . Peripheral vascular disease ([place])   . Migraine   . COPD 08/24/2009    Qualifier:  Diagnosis of  By: Burnett Kanaris    . Prediabetes 02/23/2014  . HYPERTENSION 05/12/2009    Qualifier: Diagnosis of  By: Lenn Cal Deborra Medina), Wynona Canes    . 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     . ALLERGIC RHINITIS 05/12/2009    Qualifier: Diagnosis of  By: Lenn Cal Deborra Medina), Wynona Canes    . Fibromyalgia 05/14/2014  . Anemia, iron deficiency 12/22/2014   Past Surgical History  Procedure Laterality Date  . Neck surgery    . Toe surgery    . Abdominal hysterectomy    . Tubal ligation    . Kidney stone surgery    . Hemorrhoid surgery    . Incise and drain abcess    . Back surgery    . Diagnostic laparoscopy    . Colonoscopy  12/20/2011    Procedure: COLONOSCOPY;  Surgeon: Landry Dyke, MD;  Location: [facility];  Service: Endoscopy;  Laterality: N/A;  . Colonoscopy  03/05/2012    Procedure: COLONOSCOPY;  Surgeon: Landry Dyke, MD;  Location: [facility];  Service: Endoscopy;  Laterality: N/A;   Family History  Problem Relation Age of Onset  . Diabetes Mother   . COPD Mother   . Heart disease Mother   . Diabetes Father   . Kidney disease Father   . Cancer Father   . Anesthesia problems Father   . Diabetes Sister   . Hypertension Sister   . Diabetes Brother   . Sleep apnea Brother   . Heart disease Sister   . Diabetes Sister   . Heart disease Brother    Social History   Social History  . Marital Status: Divorced    Spouse Name: N/A  . Number of Children: N/A  . Years of Education: N/A   Occupational History  . Not on file.   Social History Main Topics  . Smoking status: Former Smoker -- 0.50 packs/day for 36 years    Types: Cigarettes    Quit date: 07/18/2015  . Smokeless tobacco: Never Used     Comment: Pt states she is "an off and on smoker."    . Alcohol Use: No     Comment: drinks red wine  occasionally  . Drug Use: No  . Sexual Activity: Yes    Birth Control/ Protection: Surgical   Other Topics Concern  . Not on file   Social History Narrative   Lives alone   Lives apt, 3 stories up; goes up and down stairs every day;    Generally climbs flights of stairs 2 times a day   ROS-see HPI   Negative unless "+" Constitutional:    weight loss, night sweats, fevers, chills, fatigue, lassitude. HEENT:    headaches, difficulty swallowing, tooth/dental problems, sore throat,       sneezing, itching, ear ache, nasal congestion, post  nasal drip, snoring CV:    chest pain, orthopnea, PND, swelling in lower extremities, anasarca,                                                     dizziness, palpitations Resp:   shortness of breath with exertion or at rest.                productive cough,   non-productive cough, coughing up of blood.              change in color of mucus.  wheezing.   Skin:    + HPI GI:  No-   heartburn, indigestion, abdominal pain, nausea, vomiting, diarrhea,                 change in bowel habits, loss of appetite GU: dysuria, change in color of urine, no urgency or frequency.   flank pain. MS:   joint pain, stiffness, decreased range of motion, back pain. Neuro-     nothing unusual Psych:  change in mood or affect.  depression or anxiety.   memory loss.  OBJ- Physical Exam General- Alert, Oriented, Affect-appropriate, Distress- none acute Skin- + a few scattered small raised spots on upper chest, no urticaria. No dermatographism where she was scratching at herself Lymphadenopathy- none Head- atraumatic            Eyes- Gross vision intact, PERRLA, conjunctivae and secretions clear            Ears- Hearing, canals-normal            Nose- Clear, no-Septal dev, mucus, polyps, erosion, perforation             Throat- Mallampati II , mucosa clear , drainage- none, tonsils- atrophic Neck- flexible , trachea midline, no stridor , thyroid nl, carotid no bruit Chest  - symmetrical excursion , unlabored           Heart/CV- RRR , no murmur , no gallop  , no rub, nl s1 s2                           - JVD- none , edema- none, stasis changes- none, varices- none           Lung- clear to P&A, wheeze- none, cough- none , dullness-none, rub- none           Chest wall-  Abd-  Br/ Gen/ Rectal- Not done, not indicated Extrem- cyanosis- none, clubbing, none, atrophy- none, strength- nl Neuro- grossly intact to observation

## 2015-12-20 NOTE — Patient Instructions (Addendum)
Order- labs   CBC w diff, Allergy profile, Food IgE profile, angioedema profile, Latex IgE , sed rate, ANA   Dx urticaria   Use only hypoallergenic, fragrance free bath and laundry products      Like [place] [employer] detergent,  [employer]  Try fastening something rubber, like rubber bands, against the skin of your inner wrist for awhile, to see it that is a trigger for your itching.  Try combination of daily  H1 antihistamine like claritin or zyrtec  H2 antihistamine like pepcid, tagamet or zantac

## 2015-12-21 LAB — C4 COMPLEMENT: C4 Complement: 34 mg/dL (ref 10–40)

## 2015-12-21 LAB — ANA: Anti Nuclear Antibody(ANA): NEGATIVE

## 2015-12-22 LAB — ALLERGY FULL PROFILE
Allergen, D pternoyssinus,d7: 0.15 kU/L — ABNORMAL HIGH
Allergen,Goose feathers, e70: <0.1 kU/L
Alternaria Alternata: <0.1 kU/L
Aspergillus fumigatus, m3: <0.1 kU/L
Bahia Grass: <0.1 kU/L
Bermuda Grass: <0.1 kU/L
Box Elder IgE: <0.1 kU/L
Candida Albicans: <0.1 kU/L
Cat Dander: <0.1 kU/L
Common Ragweed: <0.1 kU/L
Curvularia lunata: <0.1 kU/L
D. farinae: 0.12 kU/L — ABNORMAL HIGH
Dog Dander: <0.1 kU/L
Elm IgE: <0.1 kU/L
Fescue: <0.1 kU/L
G005 Rye, Perennial: <0.1 kU/L
G009 Red Top: <0.1 kU/L
Goldenrod: <0.1 kU/L
Helminthosporium halodes: <0.1 kU/L
House Dust Hollister: <0.1 kU/L
Lamb's Quarters: <0.1 kU/L
Oak: <0.1 kU/L
Plantain: <0.1 kU/L
Stemphylium Botryosum: <0.1 kU/L
Sycamore Tree: <0.1 kU/L
Timothy Grass: <0.1 kU/L

## 2015-12-22 LAB — ALLERGEN FOOD PROFILE SPECIFIC IGE
Apple: <0.1 kU/L
Chicken IgE: <0.1 kU/L
Corn: <0.1 kU/L
Egg White IgE: <0.1 kU/L
Fish Cod: <0.1 kU/L
IgE (Immunoglobulin E), Serum: 52 kU/L (ref <115)
Milk IgE: <0.1 kU/L
Orange: <0.1 kU/L
Peanut IgE: <0.1 kU/L
Shrimp IgE: <0.1 kU/L
Soybean IgE: <0.1 kU/L
Tomato IgE: <0.1 kU/L
Tuna IgE: <0.1 kU/L
Wheat IgE: <0.1 kU/L

## 2015-12-22 LAB — C1 ESTERASE INHIBITOR: C1INH SerPl-mCnc: 37 mg/dL (ref 21–39)

## 2015-12-22 LAB — C1 ESTERASE INHIBITOR, FUNCTIONAL: C1INH Functional/C1INH Total MFr SerPl: 96 % (ref >=68)

## 2015-12-22 LAB — LATEX, IGE: Latex: <0.1 kU/L

## 2015-12-24 LAB — COMPLEMENT COMPONENT C1Q: Complement C1Q: 5.1 mg/dL (ref 5.0–8.6)

## 2016-01-05 ENCOUNTER — Telehealth: Payer: Self-pay | Admitting: Internal Medicine

## 2016-01-05 DIAGNOSIS — J309 Allergic rhinitis, unspecified: Secondary | ICD-10-CM

## 2016-01-05 NOTE — Telephone Encounter (Signed)
Is requesting urgent referral to Dr. Starling Manns with Allergy and asthma.  Does not want to see Dr. Annamaria Boots.

## 2016-01-05 NOTE — Telephone Encounter (Signed)
Referral done

## 2016-01-09 ENCOUNTER — Ambulatory Visit: Payer: Self-pay | Admitting: Adult Health

## 2016-01-13 ENCOUNTER — Ambulatory Visit: Payer: Self-pay | Admitting: Emergency Medicine

## 2016-01-16 ENCOUNTER — Other Ambulatory Visit: Payer: Self-pay | Admitting: Internal Medicine

## 2016-01-17 ENCOUNTER — Ambulatory Visit: Payer: Self-pay | Admitting: Adult Health

## 2016-01-19 ENCOUNTER — Ambulatory Visit: Payer: Self-pay | Admitting: Family Medicine

## 2016-01-20 ENCOUNTER — Ambulatory Visit: Payer: Self-pay | Admitting: Internal Medicine

## 2016-01-23 ENCOUNTER — Other Ambulatory Visit: Payer: Self-pay | Admitting: Allergy and Immunology

## 2016-01-23 ENCOUNTER — Encounter: Payer: Self-pay | Admitting: Allergy and Immunology

## 2016-01-23 ENCOUNTER — Ambulatory Visit (INDEPENDENT_AMBULATORY_CARE_PROVIDER_SITE_OTHER): Payer: Medicare Other | Admitting: Allergy and Immunology

## 2016-01-23 VITALS — BP 128/78 | HR 80 | Temp 98.1°F | Resp 20 | Ht 63.39 in | Wt 167.5 lb

## 2016-01-23 DIAGNOSIS — B370 Candidal stomatitis: Secondary | ICD-10-CM | POA: Diagnosis not present

## 2016-01-23 DIAGNOSIS — J309 Allergic rhinitis, unspecified: Secondary | ICD-10-CM | POA: Insufficient documentation

## 2016-01-23 DIAGNOSIS — J3089 Other allergic rhinitis: Secondary | ICD-10-CM

## 2016-01-23 DIAGNOSIS — J449 Chronic obstructive pulmonary disease, unspecified: Secondary | ICD-10-CM

## 2016-01-23 DIAGNOSIS — L500 Allergic urticaria: Secondary | ICD-10-CM | POA: Diagnosis not present

## 2016-01-23 DIAGNOSIS — L501 Idiopathic urticaria: Secondary | ICD-10-CM | POA: Insufficient documentation

## 2016-01-23 MED ORDER — IPRATROPIUM-ALBUTEROL 0.5-2.5 (3) MG/3ML IN SOLN
3.0000 mL | Freq: Once | RESPIRATORY_TRACT | Status: DC
Start: 2016-01-23 — End: 2018-04-17

## 2016-01-23 MED ORDER — FLUTICASONE PROPIONATE 50 MCG/ACT NA SUSP: 2.0000 | Freq: Every day | NASAL | Status: DC

## 2016-01-23 MED ORDER — CLOTRIMAZOLE 10 MG MT TROC: OROMUCOSAL | Status: DC

## 2016-01-23 NOTE — Assessment & Plan Note (Signed)
There is no obvious etiology identified. Skin tests to select food allergens were negative today. NSAIDs and emotional stress commonly exacerbate urticaria but are not the underlying etiology in this case. Physical urticarias are negative by history (i.e. pressure-induced, temperature, vibration, solar, etc.). History and lesions are not consistent with urticaria pigmentosa so I am not suspicious for mastocytosis. There are no concomitant symptoms concerning for anaphylaxis or constitutional symptoms worrisome for an underlying malignancy. We will not order labs at this time, however, if lesions recur, persist, progress, or change in character, we will assess potential etiologies with screening labs.  For symptom relief, patient is to take oral antihistamines as directed.  Instructions have been provided and discussed for H1/H2 receptor blockade with step-wise increase/decrease to find lowest effective dose.  Should symptoms recur, a journal is to be kept recording any foods eaten, beverages consumed, medications taken within a 6 hour period prior to the onset of symptoms, as well as record activities being performed, and environmental conditions. For any symptoms concerning for anaphylaxis, 911 is to be called immediately.

## 2016-01-23 NOTE — Assessment & Plan Note (Signed)
   I have discussed appropriate oral hygiene with inhaled corticosteroid use.  [name] has verbalized understanding.  A prescription has been provided for clotrimazole 10 mg troches: Slowly dissolve in the mouth 5 times per day for 10 days, not to be chewed or swallowed whole.

## 2016-01-23 NOTE — Assessment & Plan Note (Signed)
   Aeroallergen avoidance measures have been discussed and provided in written form.  A prescription has been provided for fluticasone nasal spray, 2 sprays per nostril daily as needed. Proper nasal spray technique has been discussed and demonstrated.  Nasal saline lavage (NeilMed) as needed has been recommended along with instructions for proper administration.  Cetirizine 10 mg daily as needed.

## 2016-01-23 NOTE — Patient Instructions (Addendum)
Allergic urticaria There is no obvious etiology identified. Skin tests to select food allergens were negative today. NSAIDs and emotional stress commonly exacerbate urticaria but are not the underlying etiology in this case. Physical urticarias are negative by history (i.e. pressure-induced, temperature, vibration, solar, etc.). History and lesions are not consistent with urticaria pigmentosa so I am not suspicious for mastocytosis. There are no concomitant symptoms concerning for anaphylaxis or constitutional symptoms worrisome for an underlying malignancy. We will not order labs at this time, however, if lesions recur, persist, progress, or change in character, we will assess potential etiologies with screening labs.  For symptom relief, patient is to take oral antihistamines as directed.  Instructions have been provided and discussed for H1/H2 receptor blockade with step-wise increase/decrease to find lowest effective dose.  Should symptoms recur, a journal is to be kept recording any foods eaten, beverages consumed, medications taken within a 6 hour period prior to the onset of symptoms, as well as record activities being performed, and environmental conditions. For any symptoms concerning for anaphylaxis, 911 is to be called immediately.  Perennial allergic rhinitis  Aeroallergen avoidance measures have been discussed and provided in written form.  A prescription has been provided for fluticasone nasal spray, 2 sprays per nostril daily as needed. Proper nasal spray technique has been discussed and demonstrated.  Nasal saline lavage (NeilMed) as needed has been recommended along with instructions for proper administration.  Cetirizine 10 mg daily as needed.  COPD (chronic obstructive pulmonary disease)  Continue treatment plan as scribed by her pulmonologist.  To maximize pulmonary deposition and decrease the chance for oral candidiasis, a spacer has been provided along with instructions for  its proper administration with ICS HFA inhaler.  Oral candidiasis  I have discussed appropriate oral hygiene with inhaled corticosteroid use.  [name] has verbalized understanding.  A prescription has been provided for clotrimazole 10 mg troches: Slowly dissolve in the mouth 5 times per day for 10 days, not to be chewed or swallowed whole.    Return in about 4 weeks (around 02/20/2016), or if symptoms worsen or fail to improve.   Hives (urticaria)  . Cetirizine (Zyrtec) 10mg  twice a day and ranitidine (Zantac) 150 mg twice a day. If no symptoms for 7-14 days then decrease to. . Cetirizine (Zyrtec) 10mg  twice a day and ranitidine (Zantac) 150 mg once a day.  If no symptoms for 7-14 days then decrease to. . Cetirizine (Zyrtec) 10mg  twice a day.  If no symptoms for 7-14 days then decrease to. . Cetirizine (Zyrtec) 10mg  once a day.  May use Benadryl (diphenhydramine) as needed for breakthrough hives       If symptoms return, then step up dosage  Control of [place] dust mites play a major role in allergic asthma and rhinitis.  They occur in environments with high humidity wherever human skin, the food for dust mites is found. High levels have been detected in dust obtained from mattresses, pillows, carpets, upholstered furniture, bed covers, clothes and soft toys.  The principal allergen of the house dust mite is found in its feces.  A gram of dust may contain 1,000 mites and 250,000 fecal particles.  Mite antigen is easily measured in the air during house cleaning activities.    1. Encase mattresses, including the box spring, and pillow, in an air tight cover.  Seal the zipper end of the encased mattresses with wide adhesive tape. 2. Wash the bedding in water of 130 degrees Farenheit  weekly.  Avoid cotton comforters/quilts and flannel bedding: the most ideal bed covering is the dacron comforter. 3. Remove all upholstered furniture from the bedroom. 4. Remove carpets,  carpet padding, rugs, and non-washable window drapes from the bedroom.  Wash drapes weekly or use plastic window coverings. 5. Remove all non-washable stuffed toys from the bedroom.  Wash stuffed toys weekly. 6. Have the room cleaned frequently with a vacuum cleaner and a damp dust-mop.  The patient should not be in a room which is being cleaned and should wait 1 hour after cleaning before going into the room. 7. Close and seal all heating outlets in the bedroom.  Otherwise, the room will become filled with dust-laden air.  An electric heater can be used to heat the room. 8. Reduce indoor humidity to less than 50%.  Do not use a humidifier.

## 2016-01-23 NOTE — Assessment & Plan Note (Signed)
   Continue treatment plan as scribed by her pulmonologist.  To maximize pulmonary deposition and decrease the chance for oral candidiasis, a spacer has been provided along with instructions for its proper administration with ICS HFA inhaler.

## 2016-01-23 NOTE — Progress Notes (Signed)
New Patient Note  RE: [name] MRN: [id number] DOB: 1961-04-05 Date of Office Visit: 01/23/2016  Referring provider: Biagio Borg, MD Primary care provider: Cathlean Cower, MD  Chief Complaint: Allergic Reaction and Medication Problem   History of present illness: HPI Comments: [name] is a 55 y.o. female who presents today for consultation of hives and rhinitis.  She reports that over the past 1 month, she has experienced recurrent episodes of hives. Typical distribution includes her entire body.  The lesions are described as erythematous, raised, and pruritic.  Individual hives last less than 24 hours without leaving residual pigmentation or bruising.  She does not experience concomitant cardiopulmonary or GI symptoms.  She has not experienced unexpected weight loss, recurrent fevers or drenching night sweats. No specific medication, food or environmental triggers have been identified, though on one occasion symptoms occurred while she ate dinner at a [employer]. The symptoms do not seem to correlate with NSAIDs use or emotional stress.  She does not recall if she had signs or symptoms of infection at the time of symptom onset. [name] has tried to control symptoms with diphenhydramine as needed with mild/moderate symptom relief. She has been evaluated and treated in the emergency department for these symptoms. Skin biopsy has not been performed.  [name] also complains of nasal congestion, rhinorrhea, sneezing, thick postnasal drainage, and sinus pressure. No significant seasonal symptom variation has been noted nor have specific environmental triggers been identified.  She reports that she was diagnosed with COPD in 2006 and is followed by Dr. Lamonte Sakai with [facility].  She quit smoking tobacco one month ago.  She does not use a spacer device with HFA inhalers.     Assessment and plan: Allergic urticaria There is no obvious etiology identified. Skin tests  to select food allergens were negative today. NSAIDs and emotional stress commonly exacerbate urticaria but are not the underlying etiology in this case. Physical urticarias are negative by history (i.e. pressure-induced, temperature, vibration, solar, etc.). History and lesions are not consistent with urticaria pigmentosa so I am not suspicious for mastocytosis. There are no concomitant symptoms concerning for anaphylaxis or constitutional symptoms worrisome for an underlying malignancy. We will not order labs at this time, however, if lesions recur, persist, progress, or change in character, we will assess potential etiologies with screening labs.  For symptom relief, patient is to take oral antihistamines as directed.  Instructions have been provided and discussed for H1/H2 receptor blockade with step-wise increase/decrease to find lowest effective dose.  Should symptoms recur, a journal is to be kept recording any foods eaten, beverages consumed, medications taken within a 6 hour period prior to the onset of symptoms, as well as record activities being performed, and environmental conditions. For any symptoms concerning for anaphylaxis, 911 is to be called immediately.  Perennial allergic rhinitis  Aeroallergen avoidance measures have been discussed and provided in written form.  A prescription has been provided for fluticasone nasal spray, 2 sprays per nostril daily as needed. Proper nasal spray technique has been discussed and demonstrated.  Nasal saline lavage (NeilMed) as needed has been recommended along with instructions for proper administration.  Cetirizine 10 mg daily as needed.  COPD (chronic obstructive pulmonary disease)  Continue treatment plan as scribed by her pulmonologist.  To maximize pulmonary deposition and decrease the chance for oral candidiasis, a spacer has been provided along with instructions for its proper administration with ICS HFA inhaler.  Oral  candidiasis  I have  discussed appropriate oral hygiene with inhaled corticosteroid use.  [name] has verbalized understanding.  A prescription has been provided for clotrimazole 10 mg troches: Slowly dissolve in the mouth 5 times per day for 10 days, not to be chewed or swallowed whole.    Meds ordered this encounter  Medications  . ipratropium-albuterol (DUONEB) 0.5-2.5 (3) MG/3ML nebulizer solution 3 mL    Sig:   . fluticasone (FLONASE) 50 MCG/ACT nasal spray    Sig: Place 2 sprays into both nostrils daily.    Dispense:  16 g    Refill:  5  . clotrimazole (MYCELEX) 10 MG troche    Sig: Slowly dissolve in the mouth 5 times daily for 10 days    Dispense:  50 tablet    Refill:  0    Diagnositics: Spirometry: FVC was 1.29 L (50% predicted) and FEV1 was 0.90 L (43% predicted) without significant post bronchodilator improvement. Environmental skin testing: Positive to dust mite antigen. Food allergen skin testing: Negative despite a positive histamine control.    Physical examination: Blood pressure 128/78, pulse 80, temperature 98.1 F (36.7 C), resp. rate 20, height 5' 3.39" (1.61 m), weight 167 lb 8.8 oz (76 kg).  General: Alert, interactive, in no acute distress. HEENT: TMs pearly gray, turbinates edematous without discharge, post-pharynx moderately erythematous. Whitish plaques on the tongue and soft palate. Neck: Supple without lymphadenopathy. Lungs: Decreased breath sounds bilaterally without wheezing, rhonchi or rales. CV: Normal S1, S2 without murmurs. Abdomen: Nondistended, nontender. Skin: Warm and dry, without lesions or rashes. Extremities:  No clubbing, cyanosis or edema. Neuro:   Grossly intact.  Review of systems: Review of Systems  Constitutional: Negative for fever, chills and weight loss.  HENT: Positive for congestion. Negative for nosebleeds.   Eyes: Negative for blurred vision.  Respiratory: Positive for hemoptysis, shortness of breath and wheezing.    Cardiovascular: Negative for chest pain.  Gastrointestinal: Negative for diarrhea and constipation.  Genitourinary: Negative for dysuria.  Musculoskeletal: Negative for myalgias and joint pain.  Skin: Positive for itching and rash.  Neurological: Positive for headaches. Negative for dizziness.  Endo/Heme/Allergies: Positive for environmental allergies. Does not bruise/bleed easily.    Past medical history: Past Medical History  Diagnosis Date  . Asthma   . Anxiety   . COPD (chronic obstructive pulmonary disease) ([place])   . Angina   . Depression   . Hypertension   . GERD (gastroesophageal reflux disease)   . Arthritis   . Peripheral vascular disease ([place])   . Migraine   . COPD 08/24/2009    Qualifier: Diagnosis of  By: Burnett Kanaris    . Prediabetes 02/23/2014  . HYPERTENSION 05/12/2009    Qualifier: Diagnosis of  By: Lenn Cal Deborra Medina), Wynona Canes    . 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     . ALLERGIC RHINITIS 05/12/2009    Qualifier: Diagnosis of  By: Lenn Cal Deborra Medina), Wynona Canes    . Fibromyalgia 05/14/2014  . Anemia, iron deficiency 12/22/2014  . Urticaria     Past surgical history: Past Surgical History  Procedure Laterality Date  . Neck surgery    . Toe surgery    . Abdominal hysterectomy    . Tubal ligation    . Kidney stone surgery    . Hemorrhoid surgery    . Incise and drain abcess    .  Back surgery    . Diagnostic laparoscopy    . Colonoscopy  12/20/2011    Procedure: COLONOSCOPY;  Surgeon: Landry Dyke, MD;  Location: [facility];  Service: Endoscopy;  Laterality: N/A;  . Colonoscopy  03/05/2012    Procedure: COLONOSCOPY;  Surgeon: Landry Dyke, MD;  Location: [facility];  Service: Endoscopy;  Laterality: N/A;    Family history: Family History  Problem Relation Age of Onset  .  Diabetes Mother   . COPD Mother   . Heart disease Mother   . Asthma Mother   . Diabetes Father   . Kidney disease Father   . Cancer Father   . Anesthesia problems Father   . Diabetes Sister   . Hypertension Sister   . Diabetes Brother   . Sleep apnea Brother   . Asthma Brother   . Heart disease Sister   . Diabetes Sister   . Heart disease Brother   . Asthma Sister   . Allergic rhinitis Neg Hx   . Eczema Neg Hx   . Urticaria Neg Hx     Social history: Social History   Social History  . Marital Status: Divorced    Spouse Name: N/A  . Number of Children: N/A  . Years of Education: N/A   Occupational History  . Not on file.   Social History Main Topics  . Smoking status: Former Smoker -- 0.50 packs/day for 36 years    Types: Cigarettes    Quit date: 07/18/2015  . Smokeless tobacco: Never Used     Comment: Pt states she is "an off and on smoker."    . Alcohol Use: No     Comment: drinks red wine occasionally  . Drug Use: No  . Sexual Activity: Yes    Birth Control/ Protection: Surgical   Other Topics Concern  . Not on file   Social History Narrative   Lives alone   Lives apt, 3 stories up; goes up and down stairs every day;    Generally climbs flights of stairs 2 times a day   Environmental History: [name] lives in a house with hardwood floors throughout and central air/heat.  There is a dog in house.  She quit smoking cigarettes last month.  She had smoked one pack per day on average since she was a teenager.    Medication List       This list is accurate as of: 01/23/16  5:36 PM.  Always use your most recent med list.               albuterol 108 (90 Base) MCG/ACT inhaler  Commonly known as:  PROVENTIL HFA;VENTOLIN HFA  Inhale 1-2 puffs into the lungs every 6 (six) hours as needed for wheezing.     albuterol (5 MG/ML) 0.5% nebulizer solution  Commonly known as:  PROVENTIL  Take 0.5 mLs (2.5 mg total) by nebulization at bedtime as needed. As needed  for shortness of breath     cholecalciferol 1000 units tablet  Commonly known as:  VITAMIN D  Take 1,000 Units by mouth daily.     clotrimazole 10 MG troche  Commonly known as:  MYCELEX  Slowly dissolve in the mouth 5 times daily for 10 days     cyclobenzaprine 5 MG tablet  Commonly known as:  FLEXERIL  Take 1 tablet (5 mg total) by mouth 3 (three) times daily as needed for muscle spasms.     diphenhydrAMINE 25 MG tablet  Commonly known  as:  BENADRYL  Take 25 mg by mouth every 6 (six) hours as needed.     DULERA 200-5 MCG/ACT Aero  Generic drug:  mometasone-formoterol  INHALE 2 PUFFS INTO THE LUNGS TWICE DAILY     FLUoxetine 20 MG capsule  Commonly known as:  PROZAC  TAKE ONE CAPSULE BY MOUTH EVERY DAY     fluticasone 50 MCG/ACT nasal spray  Commonly known as:  FLONASE  Place 2 sprays into both nostrils 2 (two) times daily.     fluticasone 50 MCG/ACT nasal spray  Commonly known as:  FLONASE  Place 2 sprays into both nostrils daily.     hydrOXYzine 25 MG capsule  Commonly known as:  VISTARIL  TAKE ONE CAPSULE BY MOUTH THREE TIMES DAILY AS NEEDED     irbesartan-hydrochlorothiazide 150-12.5 MG tablet  Commonly known as:  AVALIDE  TAKE 1 TABLET BY MOUTH EVERY DAY     meloxicam 15 MG tablet  Commonly known as:  MOBIC  Take 1 tablet (15 mg total) by mouth daily.     montelukast 10 MG tablet  Commonly known as:  SINGULAIR  Take 1 tablet (10 mg total) by mouth at bedtime.     NONFORMULARY OR COMPOUNDED ITEM  Estradiol 0.02 % 78ml prefilled applicator Sig: apply twice a week     omeprazole 20 MG capsule  Commonly known as:  PRILOSEC  Take 1 capsule (20 mg total) by mouth daily.     tiotropium 18 MCG inhalation capsule  Commonly known as:  SPIRIVA HANDIHALER  Place 1 capsule (18 mcg total) into inhaler and inhale daily.     tiZANidine 4 MG tablet  Commonly known as:  ZANAFLEX  Take 1 tablet (4 mg total) by mouth every 6 (six) hours as needed for muscle spasms.       traMADol 50 MG tablet  Commonly known as:  ULTRAM  Take 1 tablet (50 mg total) by mouth every 6 (six) hours as needed. For pain.        Known medication allergies: Allergies  Allergen Reactions  . Sulfa Antibiotics Nausea And Vomiting  . Sulfonamide Derivatives Nausea And Vomiting    REACTION: n/v    I appreciate the opportunity to take part in this  [name]'s care. Please do not hesitate to contact me with questions.  Sincerely,   R. Edgar Frisk, MD

## 2016-01-25 ENCOUNTER — Ambulatory Visit (INDEPENDENT_AMBULATORY_CARE_PROVIDER_SITE_OTHER)
Admission: RE | Admit: 2016-01-25 | Discharge: 2016-01-25 | Disposition: A | Discharge status: Home / Self Care | Payer: Medicare Other | Source: Ambulatory Visit | Attending: Internal Medicine | Admitting: Internal Medicine

## 2016-01-25 ENCOUNTER — Encounter: Payer: Self-pay | Admitting: Family Medicine

## 2016-01-25 ENCOUNTER — Other Ambulatory Visit: Payer: Self-pay | Admitting: Family Medicine

## 2016-01-25 ENCOUNTER — Other Ambulatory Visit (INDEPENDENT_AMBULATORY_CARE_PROVIDER_SITE_OTHER): Payer: Medicare Other

## 2016-01-25 ENCOUNTER — Ambulatory Visit (INDEPENDENT_AMBULATORY_CARE_PROVIDER_SITE_OTHER): Payer: Medicare Other | Admitting: Internal Medicine

## 2016-01-25 ENCOUNTER — Ambulatory Visit (INDEPENDENT_AMBULATORY_CARE_PROVIDER_SITE_OTHER)
Admission: RE | Admit: 2016-01-25 | Discharge: 2016-01-25 | Disposition: A | Discharge status: Home / Self Care | Payer: Medicare Other | Source: Ambulatory Visit | Attending: Family Medicine | Admitting: Family Medicine

## 2016-01-25 ENCOUNTER — Ambulatory Visit (INDEPENDENT_AMBULATORY_CARE_PROVIDER_SITE_OTHER): Payer: Medicare Other | Admitting: Family Medicine

## 2016-01-25 ENCOUNTER — Encounter: Payer: Self-pay | Admitting: Internal Medicine

## 2016-01-25 VITALS — BP 110/82 | HR 82 | Ht 63.0 in | Wt 166.0 lb

## 2016-01-25 VITALS — BP 110/82 | HR 80 | Temp 98.6°F | Resp 20 | Ht 63.0 in | Wt 166.0 lb

## 2016-01-25 DIAGNOSIS — I10 Essential (primary) hypertension: Secondary | ICD-10-CM

## 2016-01-25 DIAGNOSIS — F17200 Nicotine dependence, unspecified, uncomplicated: Secondary | ICD-10-CM

## 2016-01-25 DIAGNOSIS — Z720 Tobacco use: Secondary | ICD-10-CM

## 2016-01-25 DIAGNOSIS — M7061 Trochanteric bursitis, right hip: Secondary | ICD-10-CM

## 2016-01-25 DIAGNOSIS — R069 Unspecified abnormalities of breathing: Secondary | ICD-10-CM | POA: Diagnosis not present

## 2016-01-25 DIAGNOSIS — M5416 Radiculopathy, lumbar region: Secondary | ICD-10-CM

## 2016-01-25 DIAGNOSIS — R5383 Other fatigue: Secondary | ICD-10-CM | POA: Diagnosis not present

## 2016-01-25 DIAGNOSIS — R0600 Dyspnea, unspecified: Secondary | ICD-10-CM

## 2016-01-25 DIAGNOSIS — G43809 Other migraine, not intractable, without status migrainosus: Secondary | ICD-10-CM

## 2016-01-25 DIAGNOSIS — D509 Iron deficiency anemia, unspecified: Secondary | ICD-10-CM

## 2016-01-25 DIAGNOSIS — M5441 Lumbago with sciatica, right side: Secondary | ICD-10-CM | POA: Diagnosis not present

## 2016-01-25 DIAGNOSIS — M797 Fibromyalgia: Secondary | ICD-10-CM | POA: Diagnosis not present

## 2016-01-25 DIAGNOSIS — M7062 Trochanteric bursitis, left hip: Secondary | ICD-10-CM

## 2016-01-25 DIAGNOSIS — J449 Chronic obstructive pulmonary disease, unspecified: Secondary | ICD-10-CM

## 2016-01-25 DIAGNOSIS — F329 Major depressive disorder, single episode, unspecified: Secondary | ICD-10-CM

## 2016-01-25 DIAGNOSIS — G43909 Migraine, unspecified, not intractable, without status migrainosus: Secondary | ICD-10-CM | POA: Insufficient documentation

## 2016-01-25 DIAGNOSIS — F32A Depression, unspecified: Secondary | ICD-10-CM

## 2016-01-25 DIAGNOSIS — M47816 Spondylosis without myelopathy or radiculopathy, lumbar region: Secondary | ICD-10-CM | POA: Diagnosis not present

## 2016-01-25 LAB — TSH: TSH: 0.75 u[IU]/mL (ref 0.35–4.50)

## 2016-01-25 LAB — BASIC METABOLIC PANEL
BUN: 16 mg/dL (ref 6–23)
CO2: 31 mEq/L (ref 19–32)
Calcium: 9.7 mg/dL (ref 8.4–10.5)
Chloride: 103 mEq/L (ref 96–112)
Creatinine, Ser: 0.87 mg/dL (ref 0.40–1.20)
GFR: 87.56 mL/min (ref >60.00)
Glucose, Bld: 83 mg/dL (ref 70–99)
Potassium: 4.2 mEq/L (ref 3.5–5.1)
Sodium: 140 mEq/L (ref 135–145)

## 2016-01-25 LAB — URINALYSIS, ROUTINE W REFLEX MICROSCOPIC
Bilirubin Urine: NEGATIVE
Hgb urine dipstick: NEGATIVE
Ketones, ur: NEGATIVE
Leukocytes, UA: NEGATIVE
Nitrite: NEGATIVE
Specific Gravity, Urine: 1.02 (ref 1.000–1.030)
Total Protein, Urine: NEGATIVE
Urine Glucose: NEGATIVE
Urobilinogen, UA: 0.2 (ref 0.0–1.0)
pH: 6 (ref 5.0–8.0)

## 2016-01-25 LAB — CBC WITH DIFFERENTIAL/PLATELET
Basophils Absolute: 0 10*3/uL (ref 0.0–0.1)
Basophils Relative: 0.2 % (ref 0.0–3.0)
Eosinophils Absolute: 0.2 10*3/uL (ref 0.0–0.7)
Eosinophils Relative: 1.7 % (ref 0.0–5.0)
HCT: 41.8 % (ref 36.0–46.0)
Hemoglobin: 13.5 g/dL (ref 12.0–15.0)
Lymphocytes Relative: 36 % (ref 12.0–46.0)
Lymphs Abs: 3.4 10*3/uL (ref 0.7–4.0)
MCHC: 32.2 g/dL (ref 30.0–36.0)
MCV: 92.1 fl (ref 78.0–100.0)
Monocytes Absolute: 0.7 10*3/uL (ref 0.1–1.0)
Monocytes Relative: 7.5 % (ref 3.0–12.0)
Neutro Abs: 5.1 10*3/uL (ref 1.4–7.7)
Neutrophils Relative %: 54.6 % (ref 43.0–77.0)
Platelets: 362 10*3/uL (ref 150.0–400.0)
RBC: 4.54 Mil/uL (ref 3.87–5.11)
RDW: 14.1 % (ref 11.5–15.5)
WBC: 9.4 10*3/uL (ref 4.0–10.5)

## 2016-01-25 LAB — LIPID PANEL
Cholesterol: 184 mg/dL (ref 0–200)
HDL: 59.5 mg/dL (ref >39.00)
LDL Cholesterol: 112 mg/dL — ABNORMAL HIGH (ref 0–99)
NonHDL: 124.06
Total CHOL/HDL Ratio: 3
Triglycerides: 62 mg/dL (ref 0.0–149.0)
VLDL: 12.4 mg/dL (ref 0.0–40.0)

## 2016-01-25 LAB — HEPATIC FUNCTION PANEL
ALT: 13 U/L (ref 0–35)
AST: 16 U/L (ref 0–37)
Albumin: 4.3 g/dL (ref 3.5–5.2)
Alkaline Phosphatase: 80 U/L (ref 39–117)
Bilirubin, Direct: 0.1 mg/dL (ref 0.0–0.3)
Total Bilirubin: 0.4 mg/dL (ref 0.2–1.2)
Total Protein: 7.5 g/dL (ref 6.0–8.3)

## 2016-01-25 LAB — IBC PANEL
Iron: 42 ug/dL (ref 42–145)
Saturation Ratios: 11.5 % — ABNORMAL LOW (ref 20.0–50.0)
Transferrin: 260 mg/dL (ref 212.0–360.0)

## 2016-01-25 LAB — FERRITIN: Ferritin: 50.4 ng/mL (ref 10.0–291.0)

## 2016-01-25 MED ORDER — KETOROLAC TROMETHAMINE 60 MG/2ML IM SOLN
60.0000 mg | Freq: Once | INTRAMUSCULAR | Status: AC
  Administered 2016-01-25: 60 mg via INTRAMUSCULAR

## 2016-01-25 MED ORDER — PREDNISONE 50 MG PO TABS: 50.0000 mg | ORAL_TABLET | Freq: Every day | ORAL | Status: DC

## 2016-01-25 MED ORDER — METHYLPREDNISOLONE ACETATE 80 MG/ML IJ SUSP
80.0000 mg | Freq: Once | INTRAMUSCULAR | Status: AC
Start: 2016-01-25 — End: 2016-01-25
  Administered 2016-01-25: 80 mg via INTRAMUSCULAR

## 2016-01-25 MED ORDER — VARENICLINE TARTRATE 1 MG PO TABS: 1.0000 mg | ORAL_TABLET | Freq: Two times a day (BID) | ORAL | Status: DC

## 2016-01-25 MED ORDER — VARENICLINE TARTRATE 0.5 MG X 11 & 1 MG X 42 PO MISC: ORAL | Status: DC

## 2016-01-25 MED ORDER — GABAPENTIN 100 MG PO CAPS: 200.0000 mg | ORAL_CAPSULE | Freq: Every day | ORAL | Status: DC

## 2016-01-25 MED ORDER — FLUOXETINE HCL 40 MG PO CAPS: 40.0000 mg | ORAL_CAPSULE | Freq: Every day | ORAL | Status: DC

## 2016-01-25 NOTE — Progress Notes (Signed)
[name] [address], [place] [address] Phone: [phone] Subjective:    I'm seeing this patient by the request  of:  Cathlean Cower, MD   CC: bilateral hip pain  [id number] [name] is a 55 y.o. female coming in with complaint of bilateral hip pain. Patient states that the pain is on the lateral aspect of the hips.   Patient was found to have more of a greater trochanteric bursitis. Patient was given an injection 4 months ago and states that it did help for approximately 3 months. Since then the pain is coming back. States though that now she is also having pain on bilateral knees as well as her back. States that it is more of a generalized soreness overall. States that it is difficult for her to even get out of a chair from time to time. Has not made any significant changes in her diet her medications. No recent fevers chills or any abnormal weight loss. Patient though states that it aches so bad that sometimes it does change her from doing certain activities. Patient has avoided stairs because this makes things much worse. We denies any weakness of the lower extremities.  Past Medical History  Diagnosis Date  . Asthma   . Anxiety   . COPD (chronic obstructive pulmonary disease) ([place])   . Angina   . Depression   . Hypertension   . GERD (gastroesophageal reflux disease)   . Arthritis   . Peripheral vascular disease ([place])   . Migraine   . COPD 08/24/2009    Qualifier: Diagnosis of  By: Burnett Kanaris    . Prediabetes 02/23/2014  . HYPERTENSION 05/12/2009    Qualifier: Diagnosis of  By: Lenn Cal Deborra Medina), Wynona Canes    . 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     . ALLERGIC RHINITIS 05/12/2009    Qualifier: Diagnosis of  By: Lenn Cal Deborra Medina), Wynona Canes    . Fibromyalgia  05/14/2014  . Anemia, iron deficiency 12/22/2014  . Urticaria    Past Surgical History  Procedure Laterality Date  . Neck surgery    . Toe surgery    . Abdominal hysterectomy    . Tubal ligation    . Kidney stone surgery    . Hemorrhoid surgery    . Incise and drain abcess    . Back surgery    . Diagnostic laparoscopy    . Colonoscopy  12/20/2011    Procedure: COLONOSCOPY;  Surgeon: Landry Dyke, MD;  Location: [facility];  Service: Endoscopy;  Laterality: N/A;  . Colonoscopy  03/05/2012    Procedure: COLONOSCOPY;  Surgeon: Landry Dyke, MD;  Location: [facility];  Service: Endoscopy;  Laterality: N/A;   Social History  Substance Use Topics  . Smoking status: Former Smoker -- 0.50 packs/day for 36 years    Types: Cigarettes    Quit date: 07/18/2015  . Smokeless tobacco: Never Used     Comment: Pt states she is "an off and on smoker."    . Alcohol Use: No     Comment: drinks red wine occasionally   Allergies  Allergen Reactions  . Sulfa Antibiotics Nausea And Vomiting  . Sulfonamide Derivatives Nausea And Vomiting    REACTION: n/v   Social History  Substance Use Topics  . Smoking status: Former Smoker --  0.50 packs/day for 36 years    Types: Cigarettes    Quit date: 07/18/2015  . Smokeless tobacco: Never Used     Comment: Pt states she is "an off and on smoker."    . Alcohol Use: No     Comment: drinks red wine occasionally   Family History  Problem Relation Age of Onset  . Diabetes Mother   . COPD Mother   . Heart disease Mother   . Asthma Mother   . Diabetes Father   . Kidney disease Father   . Cancer Father   . Anesthesia problems Father   . Diabetes Sister   . Hypertension Sister   . Diabetes Brother   . Sleep apnea Brother   . Asthma Brother   . Heart disease Sister   . Diabetes Sister   . Heart disease Brother   . Asthma Sister   . Allergic rhinitis Neg Hx   . Eczema Neg Hx   . Urticaria Neg Hx         Past medical history, social,  surgical and family history all reviewed in electronic medical record.   Review of Systems: No headache, visual changes, nausea, vomiting, diarrhea, constipation, dizziness, abdominal pain, skin rash, fevers, chills, night sweats, weight loss, swollen lymph nodes, body aches, joint swelling, muscle aches, chest pain, shortness of breath, mood changes.   Objective Blood pressure 110/82, pulse 82, height 5\' 3"  (1.6 m), weight 166 lb (75.297 kg), SpO2 96 %.  General: No apparent distress alert and oriented x3 mood and affect normal, dressed appropriately.  HEENT: Pupils equal, extraocular movements intact  Respiratory: Patient's speak in full sentences and does not appear short of breath  Cardiovascular: No lower extremity edema, non tender, no erythema  Skin: Warm dry intact with no signs of infection or rash on extremities or on axial skeleton.  Abdomen: Soft nontender  Neuro: Cranial nerves II through XII are intact, neurovascularly intact in all extremities with 2+ DTRs and 2+ pulses.  Lymph: No lymphadenopathy of posterior or anterior cervical chain or axillae bilaterally.  Gait normal with good balance and coordination.  MSK:  Non tender with full range of motion and good stability and symmetric strength and tone of shoulders, elbows, wrist,  knee and ankles bilaterally. Mild osteoarthritic changes of multiple joints significant healing tender and multiple different joints.  Back Exam:  Inspection: Unremarkable  Motion: Flexion 45 deg, Extension 15 deg, Side Bending to 45 deg bilaterally,  Rotation to 45 deg bilaterally  SLR laying: Positive straight leg test on the right side XSLR laying: Negative  Palpable tenderness: Diffuse tenderness to palpation in the paraspinal musculature bilaterally.Marland Kitchen FABER: Positive bilateral. Sensory change: Gross sensation intact to all lumbar and sacral dermatomes.  Reflexes: 2+ at both patellar tendons, 2+ at achilles tendons, Babinski's downgoing.    Strength at foot  Plantar-flexion: 5/5 Dorsi-flexion: 5/5 Eversion: 5/5 Inversion: 5/5  Leg strength  Quad: 5/5 Hamstring: 5/5 Hip flexor: 5/5 Hip abductors: 5/5  Gait unremarkable.  [id number] ROM IR: 15 Deg, ER: 35 Deg, Flexion: 100 Deg, Extension: 80 Deg, Abduction: 45 Deg, Adduction: 45 Deg Strength IR: /5, ER: 5/5, Flexion: 5/5, Extension: 5/5, Abduction: 5/5, Adduction: 5/5 Pelvic alignment unremarkable to inspection and palpation. Standing hip rotation and gait without trendelenburg sign / unsteadiness. Severe tenderness over the greater trochanteric bursitis bilaterally. Positive Faber bilaterally with pain on the lateral aspect of the hips. No SI joint tenderness and normal minimal SI movement. Negative straight leg test bilaterally  No change from previous exam      Impression and Recommendations:     This case required medical decision making of moderate complexity.

## 2016-01-25 NOTE — Progress Notes (Signed)
Pre visit review using our clinic review tool, if applicable. No additional management support is needed unless otherwise documented below in the visit note. 

## 2016-01-25 NOTE — Assessment & Plan Note (Signed)
Possible radicular symptoms today. X-rays ordered today. Gabapentin and prednisone given. Warned of potential side effects. Return to clinic in 1-2 weeks.

## 2016-01-25 NOTE — Assessment & Plan Note (Signed)
Likely having more of a flare of her fibromyalgia. Having pain overall. Patient is artery on medications for this by another provider. I do think that prednisone could be helpful. Differential also includes a lumbar radiculopathy with patient having positive straight leg test. I would like to get x-rays to rule out any other bony abnormality that could be contribute in. Gabapentin written for. I think this would be helpful as well. Patient does have a history of peripheral vascular disease and we may need to consider an ABI or further imaging. Patient's MRI in 2014 of her back did show some slipping of the disc and possible epidural could be helpful as well. Pressure will try conservative therapy with patient coming back in 1-2 weeks for further evaluation.

## 2016-01-25 NOTE — Addendum Note (Signed)
Addended by: Letta Median R on: 01/25/2016 11:50 AM   Modules accepted: Orders

## 2016-01-25 NOTE — Assessment & Plan Note (Addendum)
stable overall by history and exam, recent data reviewed with pt, and pt to continue medical treatment as before,  to f/u any worsening symptoms or concerns, exam benign,  SpO2 Readings from Last 3 Encounters:  01/25/16 96%  01/25/16 96%  12/20/15 96%  also for cxr today given recent subjective sob/doe

## 2016-01-25 NOTE — Assessment & Plan Note (Signed)
Hurting her again. I think likely secondary to more of her back pain or possibly the fibromyalgia. Still continuing to have trouble we may consider injections at follow-up.

## 2016-01-25 NOTE — Assessment & Plan Note (Signed)
Urged to quit, for chantix asd,  to f/u any worsening symptoms or concerns

## 2016-01-25 NOTE — Assessment & Plan Note (Signed)
Etiology unclear, does not seem to have hypersomnolence though wt up a few lbs, likely psych d/o related,  Lab Results  Component Value Date   WBC 9.3 12/20/2015   HGB 13.0 12/20/2015   HCT 40.0 12/20/2015   PLT 341.0 12/20/2015   GLUCOSE 83 06/22/2015   CHOL 162 12/21/2014   TRIG 43.0 12/21/2014   HDL 63.00 12/21/2014   LDLCALC 90 12/21/2014   ALT 25 12/21/2014   AST 19 12/21/2014   NA 139 06/22/2015   K 4.8 06/22/2015   CL 103 06/22/2015   CREATININE 0.78 06/22/2015   BUN 15 06/22/2015   CO2 31 06/22/2015   TSH 0.70 12/21/2014   HGBA1C 5.5 12/15/2013   For fu labs

## 2016-01-25 NOTE — Telephone Encounter (Signed)
Refill done.  

## 2016-01-25 NOTE — Progress Notes (Signed)
Subjective:    Patient ID: [name], female    DOB: 25-Mar-1961, 55 y.o.   MRN: [id number]  HPI  Here with increased freq HA about 2 times per day,for the past month, right sided, throbbing, worse to bend and exertion, otc excedrin makes better, has occas dizziness or blurred vision but no n/v.    Also with marked lack of energy, not sure why, tends to fall asleep easy during the day, thinks has gained some wt; wt in nov 2015 was 163 only , daughter states she snores in her sleep but no apnea like behavior Wt Readings from Last 3 Encounters:  01/25/16 166 lb (75.297 kg)  01/25/16 166 lb (75.297 kg)  01/23/16 167 lb 8.8 oz (76 kg)   Has had mile worsening depressive symptoms, but no suicidal ideation, or panic; has ongoing anxiety, just likes to be by herself in the dark. Has seen psychiatry in past, but needs "to find another good one."  Wants to quit smoking, asks to try chantix.  S/p TAH, no menses, no other bleeding. Had recent low iron July 2016, due for f/u colonoscopy 2018, and s/p EGD with [facility] feb 2016 with gastritis only.   Pt denies chest pain and no wheezing, orthopnea, PND, increased LE swelling, palpitations, dizziness or syncope, but does have ocassional unexplained sob and doe, which is not activity limiting,  No fever, cough. Still smoking  Saw sport medicine today regarding hips, for films today Past Medical History  Diagnosis Date  . Asthma   . Anxiety   . COPD (chronic obstructive pulmonary disease) ([place])   . Angina   . Depression   . Hypertension   . GERD (gastroesophageal reflux disease)   . Arthritis   . Peripheral vascular disease ([place])   . Migraine   . COPD 08/24/2009    Qualifier: Diagnosis of  By: Burnett Kanaris    . Prediabetes 02/23/2014  . HYPERTENSION 05/12/2009    Qualifier: Diagnosis of  By: Lenn Cal Deborra Medina), Wynona Canes    . ASTHMA 05/12/2009    Severe AFL (Spirometry 05/2009: pre-BD FEV1 0.87L 34% pred, post-BD FEV1 1.11L 44% pred)  Volumes hyperinflated Decreased DLCO that does not fully correct to normal range for alveolar volume.     . ALLERGIC RHINITIS 05/12/2009    Qualifier: Diagnosis of  By: Lenn Cal Deborra Medina), Wynona Canes    . Fibromyalgia 05/14/2014  . Anemia, iron deficiency 12/22/2014  . Urticaria    Past Surgical History  Procedure Laterality Date  . Neck surgery    . Toe surgery    . Abdominal hysterectomy    . Tubal ligation    . Kidney stone surgery    . Hemorrhoid surgery    . Incise and drain abcess    . Back surgery    . Diagnostic laparoscopy    . Colonoscopy  12/20/2011    Procedure: COLONOSCOPY;  Surgeon: Landry Dyke, MD;  Location: [facility];  Service: Endoscopy;  Laterality: N/A;  . Colonoscopy  03/05/2012    Procedure: COLONOSCOPY;  Surgeon: Landry Dyke, MD;  Location: [facility];  Service: Endoscopy;  Laterality: N/A;    reports that she quit smoking about 6 months ago. Her smoking use included Cigarettes. She has a 18 pack-year smoking history. She has never used smokeless tobacco. She reports that she does not drink alcohol or use illicit drugs. family history includes Anesthesia problems in her father; Asthma in her brother, mother, and sister; COPD in her mother; Cancer  in her father; Diabetes in her brother, father, mother, sister, and sister; Heart disease in her brother, mother, and sister; Hypertension in her sister; Kidney disease in her father; Sleep apnea in her brother. There is no history of Allergic rhinitis, Eczema, or Urticaria. Allergies  Allergen Reactions  . Sulfa Antibiotics Nausea And Vomiting  . Sulfonamide Derivatives Nausea And Vomiting    REACTION: n/v   Current Outpatient Prescriptions on File Prior to Visit  Medication Sig Dispense Refill  . albuterol (PROVENTIL HFA;VENTOLIN HFA) 108 (90 BASE) MCG/ACT inhaler Inhale 1-2 puffs into the lungs every 6 (six) hours as needed for wheezing. 8 g 5  . albuterol (PROVENTIL) (5 MG/ML) 0.5% nebulizer solution Take 0.5  mLs (2.5 mg total) by nebulization at bedtime as needed. As needed for shortness of breath 20 mL 3  . cholecalciferol (VITAMIN D) 1000 units tablet Take 1,000 Units by mouth daily.    . clotrimazole (MYCELEX) 10 MG troche Slowly dissolve in the mouth 5 times daily for 10 days 50 tablet 0  . cyclobenzaprine (FLEXERIL) 5 MG tablet Take 1 tablet (5 mg total) by mouth 3 (three) times daily as needed for muscle spasms. 30 tablet 0  . diphenhydrAMINE (BENADRYL) 25 MG tablet Take 25 mg by mouth every 6 (six) hours as needed.    . DULERA 200-5 MCG/ACT AERO INHALE 2 PUFFS INTO THE LUNGS TWICE DAILY 39 g 3  . FLUoxetine (PROZAC) 20 MG capsule TAKE ONE CAPSULE BY MOUTH EVERY DAY 90 capsule 3  . fluticasone (FLONASE) 50 MCG/ACT nasal spray Place 2 sprays into both nostrils 2 (two) times daily. 16 g 2  . fluticasone (FLONASE) 50 MCG/ACT nasal spray INSTILL 2 SPRAYS INTO BOTH NOSTRILS ONCE DAILY 16 g 2  . hydrOXYzine (VISTARIL) 25 MG capsule TAKE ONE CAPSULE BY MOUTH THREE TIMES DAILY AS NEEDED 60 capsule 0  . irbesartan-hydrochlorothiazide (AVALIDE) 150-12.5 MG tablet TAKE 1 TABLET BY MOUTH EVERY DAY 90 tablet 1  . meloxicam (MOBIC) 15 MG tablet Take 1 tablet (15 mg total) by mouth daily. 90 tablet 3  . montelukast (SINGULAIR) 10 MG tablet Take 1 tablet (10 mg total) by mouth at bedtime. 90 tablet 3  . NONFORMULARY OR COMPOUNDED ITEM Estradiol 0.02 % 34ml prefilled applicator Sig: apply twice a week 24 each 4  . omeprazole (PRILOSEC) 20 MG capsule Take 1 capsule (20 mg total) by mouth daily. 30 capsule 3  . predniSONE (DELTASONE) 50 MG tablet Take 1 tablet (50 mg total) by mouth daily. 5 tablet 0  . tiotropium (SPIRIVA HANDIHALER) 18 MCG inhalation capsule Place 1 capsule (18 mcg total) into inhaler and inhale daily. 30 capsule 5  . tiZANidine (ZANAFLEX) 4 MG tablet Take 1 tablet (4 mg total) by mouth every 6 (six) hours as needed for muscle spasms. 60 tablet 2  . traMADol (ULTRAM) 50 MG tablet Take 1 tablet  (50 mg total) by mouth every 6 (six) hours as needed. For pain. 30 tablet 3   Current Facility-Administered Medications on File Prior to Visit  Medication Dose Route Frequency Provider Last Rate Last Dose  . ipratropium-albuterol (DUONEB) 0.5-2.5 (3) MG/3ML nebulizer solution 3 mL  3 mL Nebulization Once Adelina Mings, MD       Review of Systems  Constitutional: Negative for unusual diaphoresis or night sweats HENT: Negative for ringing in ear or discharge Eyes: Negative for double vision or worsening visual disturbance.  Respiratory: Negative for choking and stridor.   Gastrointestinal: Negative for vomiting or other  signifcant bowel change Genitourinary: Negative for hematuria or change in urine volume.  Musculoskeletal: Negative for other MSK pain or swelling Skin: Negative for color change and worsening wound.  Neurological: Negative for tremors and numbness other than noted  Psychiatric/Behavioral: Negative for decreased concentration or agitation other than above        Objective:   Physical Exam BP 110/82 mmHg  Pulse 80  Temp(Src) 98.6 F (37 C) (Oral)  Resp 20  Ht 5\' 3"  (1.6 m)  Wt 166 lb (75.297 kg)  BMI 29.41 kg/m2  SpO2 96% VS noted,  Constitutional: Pt appears in no significant distress HENT: Head: NCAT.  Right Ear: External ear normal.  Left Ear: External ear normal.  Eyes: . Pupils are equal, round, and reactive to light. Conjunctivae and EOM are normal Neck: Normal range of motion. Neck supple.  Cardiovascular: Normal rate and regular rhythm.   Pulmonary/Chest: Effort normal and breath sounds without rales or wheezing.  Abd:  Soft, NT, ND, + BS Neurological: Pt is alert. Not confused , motor grossly intact; ambulates stiffly due to bilat hip pain Skin: Skin is warm. No rash, no LE edema Psychiatric: Pt behavior is normal. No agitation. depressed affect    Assessment & Plan:

## 2016-01-25 NOTE — Assessment & Plan Note (Signed)
stable overall by history and exam, recent data reviewed with pt, and pt to continue medical treatment as before,  to f/u any worsening symptoms or concerns BP Readings from Last 3 Encounters:  01/25/16 110/82  01/25/16 110/82  01/23/16 128/78

## 2016-01-25 NOTE — Assessment & Plan Note (Addendum)
Mild worsening recent, verified no SI or HI, for incresaed prozac 40 qd, refer psychiatry,  to f/u any worsening symptoms or concerns  Note:  Total time for pt hx, exam, review of record with pt in the room, determination of diagnoses and plan for further eval and tx is > 40 min, with over 50% spent in coordination and counseling of patient

## 2016-01-25 NOTE — Assessment & Plan Note (Signed)
No overt bleeding, no anticoag use, no menses; for iron f/u, may need f/u with GI if persists

## 2016-01-25 NOTE — Patient Instructions (Signed)
OK to increase the prozac to 40 mg per day (sent to the [facility])  OK to cont the excedrin as needed for the migraines  Please take all new medication as prescribed - the Chantix (done in hardcopy given to you today at your visit so you can shop around)  Please continue all other medications as before, and refills have been done if requested.  Please have the pharmacy call with any other refills you may need.  Please continue your efforts at being more active, low cholesterol diet, and weight control  You will be contacted regarding the referral for: Psychiatry  Please keep your appointments with your specialists as you may have planned  Please go to the XRAY Department in the Basement (go straight as you get off the elevator) for the x-ray testing (for the Chest xray to do in addition to Dr Thompson Caul lower back xray)  Please go to the LAB in the Basement (turn left off the elevator) for the tests to be done today  You will be contacted by phone if any changes need to be made immediately.  Otherwise, you will receive a letter about your results with an explanation, but please check with MyChart first.  Please remember to sign up for MyChart if you have not done so, as this will be important to you in the future with finding out test results, communicating by private email, and scheduling acute appointments online when needed.  Please return in 6 months, or sooner if needed

## 2016-01-25 NOTE — Assessment & Plan Note (Signed)
Likely related to anxiety/depression, controlled with excedrin otc prn, ok to continue, but not overuse,  to f/u any worsening symptoms or concerns

## 2016-01-25 NOTE — Patient Instructions (Signed)
Good to see you  Ice is your friend Ice 20 minutes 2 times daily. Usually after activity and before bed. 2 injections today  Prednisone daily for next 5 days starting tomorrow.  Gabapentin 100mg  at night for the first week then 200mg  at night thereafter.  Xrays downstairs today  See em again in 2 weeks and we will make sure you are doing better.

## 2016-01-26 ENCOUNTER — Telehealth: Payer: Self-pay | Admitting: Adult Health

## 2016-01-26 NOTE — Telephone Encounter (Signed)
lmtcb x1 for pt. 

## 2016-01-27 NOTE — Telephone Encounter (Signed)
Spoke with pt. Advised her that we have her disability paperwork scanned into her chart. She will need to check with [employer] and figure out why her paperwork hasn't been sent in. Nothing further was needed at this time.

## 2016-02-21 ENCOUNTER — Encounter: Payer: Self-pay | Admitting: Allergy and Immunology

## 2016-02-21 ENCOUNTER — Ambulatory Visit (INDEPENDENT_AMBULATORY_CARE_PROVIDER_SITE_OTHER): Payer: Medicare Other | Admitting: Allergy and Immunology

## 2016-02-21 VITALS — BP 124/80 | HR 74 | Resp 16

## 2016-02-21 DIAGNOSIS — J45901 Unspecified asthma with (acute) exacerbation: Secondary | ICD-10-CM | POA: Diagnosis not present

## 2016-02-21 DIAGNOSIS — J441 Chronic obstructive pulmonary disease with (acute) exacerbation: Secondary | ICD-10-CM | POA: Diagnosis not present

## 2016-02-21 DIAGNOSIS — F17200 Nicotine dependence, unspecified, uncomplicated: Secondary | ICD-10-CM

## 2016-02-21 DIAGNOSIS — L500 Allergic urticaria: Secondary | ICD-10-CM

## 2016-02-21 DIAGNOSIS — J3089 Other allergic rhinitis: Secondary | ICD-10-CM

## 2016-02-21 DIAGNOSIS — Z720 Tobacco use: Secondary | ICD-10-CM

## 2016-02-21 MED ORDER — AZELASTINE HCL 0.1 % NA SOLN: NASAL | Status: DC

## 2016-02-21 MED ORDER — ALBUTEROL SULFATE HFA 108 (90 BASE) MCG/ACT IN AERS: INHALATION_SPRAY | RESPIRATORY_TRACT | Status: DC

## 2016-02-21 MED ORDER — PREDNISONE 1 MG PO TABS: 10.0000 mg | ORAL_TABLET | ORAL | Status: DC

## 2016-02-21 MED ORDER — METHYLPREDNISOLONE ACETATE 80 MG/ML IJ SUSP
80.0000 mg | Freq: Once | INTRAMUSCULAR | Status: AC
  Administered 2016-02-21: 80 mg via INTRAMUSCULAR

## 2016-02-21 NOTE — Progress Notes (Signed)
Follow-up Note  RE: [name] MRN: [id number] DOB: 10/01/1961 Date of Office Visit: 02/21/2016  Primary care provider: Cathlean Cower, MD Referring provider: Biagio Borg, MD  History of present illness: HPI Comments: [name] is a 55 y.o. female with COPD, allergic rhinitis, and history of urticaria who presents today for follow up.  She was seen in this office for her initial evaluation on 01/23/2016.   Over the past couple weeks, she has been experiencing dyspnea and wheezing with mild exertion as well as nocturnal awakenings due to lower respiratory symptoms.  2-3 nights per week.  She reports that she has been taking Dulera more than 2 times per day as needed for rescue medication and has borrowed a relatives albuterol HFA inhaler for lower respiratory symptom relief.  She does not use a spacer device with HFA inhalers.  She was recently prescribed Chantix to assist in her attempt to quit smoking.  She reports that she is still experiencing nasal congestion, thick postnasal drainage, and occasional ear fullness. She reports that since that time she has had no recurrence of urticaria over the past month.    Assessment and plan: Acute exacerbation of COPD with asthma ([facility])  Depo-Medrol 80 mg was administered in the office.  Prednisone has been provided and is to be started tomorrow as follows: 20 mg daily x 4 days, 10 mg x1 day, then stop.  A prescription has been provided for albuterol HFA, 1-2 inhalations every 4-6 hours as needed.  Compliance with recommended dose of Dulera has been emphasized. Dulera 200/5 g, 2 inhalations twice a day.  To maximize pulmonary deposition, a spacer has been provided along with instructions for its proper administration with an HFA inhaler.  The patient has been asked to contact me if her symptoms persist or progress. Otherwise, she may return for follow up in 2 months.  Allergic rhinitis  A prescription has been provided for  azelastine nasal spray, 1-2 sprays per nostril 2 times daily as needed.   Continue fluticasone nasal spray, 2 sprays per nostril daily as needed.  Nasal saline lavage (NeilMed) as needed has been recommended along with instructions for proper administration.  Urticaria Currently quiescent.  Should significant symptoms or new symptoms occur, a journal is to be kept recording any foods eaten, beverages consumed, medications taken, activities performed, and environmental conditions within a 6 hour time period prior to the onset of symptoms. For any symptoms concerning for anaphylaxis, 911 is to be called immediately.   History of tobacco abuse  Continued tobacco abstinence has been encouraged.  Continue Chantix as prescribed.    Meds ordered this encounter  Medications  . methylPREDNISolone acetate (DEPO-MEDROL) injection 80 mg    Sig:   . albuterol (PROAIR HFA) 108 (90 Base) MCG/ACT inhaler    Sig: INHALE 1-2 PUFFS EVERY 4-6 HOURS IF NEEDED FOR COUGH OR WHEEZE.    Dispense:  1 Inhaler    Refill:  1  . azelastine (ASTELIN) 0.1 % nasal spray    Sig: USE 1-2 SPRAYS IN EACH NOSTRIL TWICE DAILY AS DIRECTED    Dispense:  30 mL    Refill:  5  . predniSONE (DELTASONE) tablet 10 mg    Sig:     Diagnositics: Spirometry reveals FVC of 1.53L (60% predicted) and FEV1 of 0.76 L (36% predicted) with significant (19%) postbronchodilator improvement.     Physical examination: Blood pressure 124/80, pulse 74, resp. rate 16.  General: Alert, interactive, in no  acute distress. HEENT: TMs pearly gray, turbinates edematous without discharge, post-pharynx moderately erythematous. Neck: Supple without lymphadenopathy. Lungs: Decreased breath sounds bilaterally without wheezing, rhonchi or rales. CV: Normal S1, S2 without murmurs. Skin: Warm and dry, without lesions or rashes.  The following portions of the patient's history were reviewed and updated as appropriate: allergies, current  medications, past family history, past medical history, past social history, past surgical history and problem list.    Medication List       This list is accurate as of: 02/21/16  1:52 PM.  Always use your most recent med list.               albuterol (2.5 MG/3ML) 0.083% nebulizer solution  Commonly known as:  PROVENTIL  Take 2.5 mg by nebulization every 4 (four) hours as needed for wheezing or shortness of breath.     albuterol 108 (90 Base) MCG/ACT inhaler  Commonly known as:  PROVENTIL HFA;VENTOLIN HFA  Inhale 1-2 puffs into the lungs every 6 (six) hours as needed for wheezing.     albuterol 108 (90 Base) MCG/ACT inhaler  Commonly known as:  PROAIR HFA  INHALE 1-2 PUFFS EVERY 4-6 HOURS IF NEEDED FOR COUGH OR WHEEZE.     azelastine 0.1 % nasal spray  Commonly known as:  ASTELIN  USE 1-2 SPRAYS IN EACH NOSTRIL TWICE DAILY AS DIRECTED     cholecalciferol 1000 units tablet  Commonly known as:  VITAMIN D  Take 1,000 Units by mouth daily.     clotrimazole 10 MG troche  Commonly known as:  MYCELEX  Slowly dissolve in the mouth 5 times daily for 10 days     cyclobenzaprine 5 MG tablet  Commonly known as:  FLEXERIL  Take 1 tablet (5 mg total) by mouth 3 (three) times daily as needed for muscle spasms.     diphenhydrAMINE 25 MG tablet  Commonly known as:  BENADRYL  Take 25 mg by mouth every 6 (six) hours as needed.     DULERA 200-5 MCG/ACT Aero  Generic drug:  mometasone-formoterol  INHALE 2 PUFFS INTO THE LUNGS TWICE DAILY     FLUoxetine 40 MG capsule  Commonly known as:  PROZAC  Take 1 capsule (40 mg total) by mouth daily.     fluticasone 50 MCG/ACT nasal spray  Commonly known as:  FLONASE  Place 2 sprays into both nostrils 2 (two) times daily.     fluticasone 50 MCG/ACT nasal spray  Commonly known as:  FLONASE  INSTILL 2 SPRAYS INTO BOTH NOSTRILS ONCE DAILY     gabapentin 100 MG capsule  Commonly known as:  NEURONTIN  TAKE 2 CAPSULES BY MOUTH AT BEDTIME      hydrOXYzine 25 MG capsule  Commonly known as:  VISTARIL  TAKE ONE CAPSULE BY MOUTH THREE TIMES DAILY AS NEEDED     irbesartan-hydrochlorothiazide 150-12.5 MG tablet  Commonly known as:  AVALIDE  TAKE 1 TABLET BY MOUTH EVERY DAY     meloxicam 15 MG tablet  Commonly known as:  MOBIC  Take 1 tablet (15 mg total) by mouth daily.     montelukast 10 MG tablet  Commonly known as:  SINGULAIR  Take 1 tablet (10 mg total) by mouth at bedtime.     NONFORMULARY OR COMPOUNDED ITEM  Estradiol 0.02 % 76ml prefilled applicator Sig: apply twice a week     omeprazole 20 MG capsule  Commonly known as:  PRILOSEC  Take 1 capsule (20 mg total) by mouth  daily.     tiotropium 18 MCG inhalation capsule  Commonly known as:  SPIRIVA HANDIHALER  Place 1 capsule (18 mcg total) into inhaler and inhale daily.     tiZANidine 4 MG tablet  Commonly known as:  ZANAFLEX  Take 1 tablet (4 mg total) by mouth every 6 (six) hours as needed for muscle spasms.     traMADol 50 MG tablet  Commonly known as:  ULTRAM  Take 1 tablet (50 mg total) by mouth every 6 (six) hours as needed. For pain.     varenicline 0.5 MG X 11 & 1 MG X 42 tablet  Commonly known as:  CHANTIX PAK  Take one 0.5 mg tablet by mouth once daily for 3 days, then increase to one 0.5 mg tablet twice daily for 4 days, then increase to one 1 mg tablet twice daily.     varenicline 1 MG tablet  Commonly known as:  CHANTIX CONTINUING MONTH PAK  Take 1 tablet (1 mg total) by mouth 2 (two) times daily.        Allergies  Allergen Reactions  . Sulfa Antibiotics Nausea And Vomiting  . Sulfonamide Derivatives Nausea And Vomiting    REACTION: n/v   Review of systems: Constitutional: Negative for fever, chills and weight loss.  HENT: Negative for nosebleeds.  Positive for nasal congestion and thick postnasal drainage. Eyes: Negative for blurred vision.  Respiratory: Negative for hemoptysis.  Positive for dyspnea, coughing, wheezing. Cardiovascular:  Negative for chest pain.  Gastrointestinal: Negative for diarrhea and constipation.  Genitourinary: Negative for dysuria.  Musculoskeletal: Negative for myalgias and joint pain.  Neurological: Negative for dizziness.  Endo/Heme/Allergies: Does not bruise/bleed easily.   Past Medical History  Diagnosis Date  . Asthma   . Anxiety   . COPD (chronic obstructive pulmonary disease) ([employer])   . Angina   . Depression   . Hypertension   . GERD (gastroesophageal reflux disease)   . Arthritis   . Peripheral vascular disease ([place])   . Migraine   . COPD 08/24/2009    Qualifier: Diagnosis of  By: Burnett Kanaris    . Prediabetes 02/23/2014  . HYPERTENSION 05/12/2009    Qualifier: Diagnosis of  By: Lenn Cal Deborra Medina), Wynona Canes    . 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     . ALLERGIC RHINITIS 05/12/2009    Qualifier: Diagnosis of  By: Lenn Cal Deborra Medina), Wynona Canes    . Fibromyalgia 05/14/2014  . Anemia, iron deficiency 12/22/2014  . Urticaria     Family History  Problem Relation Age of Onset  . Diabetes Mother   . COPD Mother   . Heart disease Mother   . Asthma Mother   . Diabetes Father   . Kidney disease Father   . Cancer Father   . Anesthesia problems Father   . Diabetes Sister   . Hypertension Sister   . Diabetes Brother   . Sleep apnea Brother   . Asthma Brother   . Heart disease Sister   . Diabetes Sister   . Heart disease Brother   . Asthma Sister   . Allergic rhinitis Neg Hx   . Eczema Neg Hx   . Urticaria Neg Hx     Social History   Social History  . Marital Status: Divorced    Spouse Name: N/A  . Number of  Children: N/A  . Years of Education: N/A   Occupational History  . Not on file.   Social History Main Topics  . Smoking status: Former Smoker -- 0.50 packs/day for 36 years    Types: Cigarettes     Quit date: 07/18/2015  . Smokeless tobacco: Never Used     Comment: Pt states she is "an off and on smoker."    . Alcohol Use: No     Comment: drinks red wine occasionally  . Drug Use: No  . Sexual Activity: Yes    Birth Control/ Protection: Surgical   Other Topics Concern  . Not on file   Social History Narrative   Lives alone   Lives apt, 3 stories up; goes up and down stairs every day;    Generally climbs flights of stairs 2 times a day    I appreciate the opportunity to take part in this [place] M's care. Please do not hesitate to contact me with questions.  Sincerely,   R. Edgar Frisk, MD

## 2016-02-21 NOTE — Assessment & Plan Note (Signed)
Currently quiescent.  Should significant symptoms or new symptoms occur, a journal is to be kept recording any foods eaten, beverages consumed, medications taken, activities performed, and environmental conditions within a 6 hour time period prior to the onset of symptoms. For any symptoms concerning for anaphylaxis, 911 is to be called immediately.

## 2016-02-21 NOTE — Assessment & Plan Note (Signed)
   A prescription has been provided for azelastine nasal spray, 1-2 sprays per nostril 2 times daily as needed.   Continue fluticasone nasal spray, 2 sprays per nostril daily as needed.  Nasal saline lavage (NeilMed) as needed has been recommended along with instructions for proper administration.

## 2016-02-21 NOTE — Assessment & Plan Note (Signed)
   Continued tobacco abstinence has been encouraged.  Continue Chantix as prescribed.

## 2016-02-21 NOTE — Patient Instructions (Addendum)
Acute exacerbation of COPD with asthma ([place])  Depo-Medrol 80 mg was administered in the office.  Prednisone has been provided and is to be started tomorrow as follows: 20 mg daily x 4 days, 10 mg x1 day, then stop.  A prescription has been provided for albuterol HFA, 1-2 inhalations every 4-6 hours as needed.  Compliance with recommended dose of Dulera has been emphasized. Dulera 200/5 g, 2 inhalations twice a day.  To maximize pulmonary deposition, a spacer has been provided along with instructions for its proper administration with an HFA inhaler.  The patient has been asked to contact me if her symptoms persist or progress. Otherwise, she may return for follow up in 2 months.  Allergic rhinitis  A prescription has been provided for azelastine nasal spray, 1-2 sprays per nostril 2 times daily as needed.   Continue fluticasone nasal spray, 2 sprays per nostril daily as needed.  Nasal saline lavage (NeilMed) as needed has been recommended along with instructions for proper administration.  Urticaria Currently quiescent.  Should significant symptoms or new symptoms occur, a journal is to be kept recording any foods eaten, beverages consumed, medications taken, activities performed, and environmental conditions within a 6 hour time period prior to the onset of symptoms. For any symptoms concerning for anaphylaxis, 911 is to be called immediately.   History of tobacco abuse  Continued tobacco abstinence has been encouraged.  Continue Chantix as prescribed.    Return in about 2 months (around 04/22/2016), or if symptoms worsen or fail to improve.

## 2016-02-21 NOTE — Assessment & Plan Note (Addendum)
   Depo-Medrol 80 mg was administered in the office.  Prednisone has been provided and is to be started tomorrow as follows: 20 mg daily x 4 days, 10 mg x1 day, then stop.  A prescription has been provided for albuterol HFA, 1-2 inhalations every 4-6 hours as needed.  Compliance with recommended dose of Dulera has been emphasized. Dulera 200/5 g, 2 inhalations twice a day.  To maximize pulmonary deposition, a spacer has been provided along with instructions for its proper administration with an HFA inhaler.  The patient has been asked to contact me if her symptoms persist or progress. Otherwise, she may return for follow up in 2 months.

## 2016-03-12 ENCOUNTER — Ambulatory Visit: Payer: Self-pay | Admitting: Emergency Medicine

## 2016-03-15 ENCOUNTER — Other Ambulatory Visit: Payer: Self-pay

## 2016-03-15 MED ORDER — ALBUTEROL SULFATE HFA 108 (90 BASE) MCG/ACT IN AERS: INHALATION_SPRAY | RESPIRATORY_TRACT | Status: DC

## 2016-03-17 ENCOUNTER — Telehealth: Payer: Self-pay | Admitting: Internal Medicine

## 2016-03-17 MED ORDER — PREDNISONE 10 MG PO TABS: ORAL_TABLET | ORAL | Status: DC

## 2016-03-17 NOTE — Telephone Encounter (Signed)
One week wheeze cough / mucus is clear  Ok at rest p saba  Tells me even when "better" still very neb saba dependent so may need ov/ revisit new maint regimen For now: called in Prednisone 10 mg take  4 each am x 2 days,   2 each am x 2 days,  1 each am x 2 days and stop  Schedule ov with Dr Lamonte Sakai / NP or me next week to regroup

## 2016-03-19 NOTE — Telephone Encounter (Signed)
LMTCB x 1 

## 2016-03-27 ENCOUNTER — Ambulatory Visit (INDEPENDENT_AMBULATORY_CARE_PROVIDER_SITE_OTHER): Payer: Medicare Other | Admitting: Acute Care

## 2016-03-27 ENCOUNTER — Encounter: Payer: Self-pay | Admitting: Acute Care

## 2016-03-27 VITALS — BP 124/68 | HR 84 | Ht 63.0 in | Wt 160.4 lb

## 2016-03-27 DIAGNOSIS — J441 Chronic obstructive pulmonary disease with (acute) exacerbation: Secondary | ICD-10-CM

## 2016-03-27 MED ORDER — AZITHROMYCIN 250 MG PO TABS: ORAL_TABLET | ORAL | Status: DC

## 2016-03-27 MED ORDER — HYDROCODONE-HOMATROPINE 5-1.5 MG/5ML PO SYRP: 5.0000 mL | ORAL_SOLUTION | Freq: Four times a day (QID) | ORAL | Status: DC | PRN

## 2016-03-27 MED ORDER — PANTOPRAZOLE SODIUM 40 MG PO TBEC: 40.0000 mg | DELAYED_RELEASE_TABLET | Freq: Every day | ORAL | Status: DC

## 2016-03-27 MED ORDER — ALBUTEROL SULFATE HFA 108 (90 BASE) MCG/ACT IN AERS: INHALATION_SPRAY | RESPIRATORY_TRACT | Status: DC

## 2016-03-27 NOTE — Progress Notes (Signed)
Subjective:    Patient ID: [name], female    DOB: 03/23/1961, 55 y.o.   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 Lamonte Sakai.    03/27/2016: Acute OV: Called the office 03/17/2016 for cough. She was treated by Dr.Wert with Prednisone taper  03/17/2016. She completed the taper and states that her cough is better than it was,but is not gone. She does have continued less acute cough with yellow secretions, worse at night.She states that her breathing is at baseline. She is not short of breath., denies fever, orthopnea, chest pain, calf or leg tenderness, or hemoptysis. No recent automobile or airline travel. Patient remains on Dulera, Spiriva, Singulair.   Current outpatient prescriptions:  .  albuterol (PROVENTIL) (2.5 MG/3ML) 0.083% nebulizer solution, Take 2.5 mg by nebulization every 4 (four) hours as needed for wheezing or shortness of breath., Disp: , Rfl:  .  albuterol (VENTOLIN HFA) 108 (90 Base) MCG/ACT inhaler, One to two puffs every 4 to 6 hours as needed, Disp: 1 Inhaler, Rfl: 5 .  azelastine (ASTELIN) 0.1 % nasal spray, USE 1-2 SPRAYS IN EACH NOSTRIL TWICE DAILY AS DIRECTED, Disp: 30 mL, Rfl: 5 .  cholecalciferol (VITAMIN D) 1000 units tablet, Take 1,000 Units by mouth daily., Disp: , Rfl:  .  clotrimazole (MYCELEX) 10 MG troche, Slowly dissolve in the mouth 5 times daily for 10 days, Disp: 50 tablet, Rfl: 0 .  cyclobenzaprine (FLEXERIL) 5 MG tablet, Take 1 tablet (5 mg total) by mouth 3 (three) times daily as needed for muscle spasms., Disp: 30 tablet, Rfl: 0 .  diphenhydrAMINE (BENADRYL) 25 MG tablet, Take 25 mg by mouth every 6 (six) hours as needed., Disp: , Rfl:  .  DULERA 200-5 MCG/ACT AERO, INHALE 2 PUFFS INTO THE LUNGS TWICE DAILY, Disp: 39 g, Rfl: 3 .  FLUoxetine (PROZAC) 40 MG capsule, Take 1 capsule (40 mg total) by mouth daily., Disp: 90 capsule, Rfl: 3 .  fluticasone (FLONASE) 50 MCG/ACT nasal spray, Place 2  sprays into both nostrils 2 (two) times daily., Disp: 16 g, Rfl: 2 .  gabapentin (NEURONTIN) 100 MG capsule, TAKE 2 CAPSULES BY MOUTH AT BEDTIME, Disp: 180 capsule, Rfl: 1 .  hydrOXYzine (VISTARIL) 25 MG capsule, TAKE ONE CAPSULE BY MOUTH THREE TIMES DAILY AS NEEDED, Disp: 60 capsule, Rfl: 0 .  irbesartan-hydrochlorothiazide (AVALIDE) 150-12.5 MG tablet, TAKE 1 TABLET BY MOUTH EVERY DAY, Disp: 90 tablet, Rfl: 1 .  meloxicam (MOBIC) 15 MG tablet, Take 1 tablet (15 mg total) by mouth daily., Disp: 90 tablet, Rfl: 3 .  montelukast (SINGULAIR) 10 MG tablet, Take 1 tablet (10 mg total) by mouth at bedtime., Disp: 90 tablet, Rfl: 3 .  NONFORMULARY OR COMPOUNDED ITEM, Estradiol 0.02 % 52ml prefilled applicator Sig: apply twice a week, Disp: 24 each, Rfl: 4 .  omeprazole (PRILOSEC) 20 MG capsule, Take 1 capsule (20 mg total) by mouth daily., Disp: 30 capsule, Rfl: 3 .  tiotropium (SPIRIVA HANDIHALER) 18 MCG inhalation capsule, Place 1 capsule (18 mcg total) into inhaler and inhale daily., Disp: 30 capsule, Rfl: 5 .  tiZANidine (ZANAFLEX) 4 MG tablet, Take 1 tablet (4 mg total) by mouth every 6 (six) hours as needed for muscle spasms., Disp: 60 tablet, Rfl: 2 .  traMADol (ULTRAM) 50 MG tablet, Take 1 tablet (50 mg total) by mouth every 6 (six) hours as needed. For pain., Disp: 30 tablet, Rfl: 3 .  varenicline (CHANTIX CONTINUING  MONTH PAK) 1 MG tablet, Take 1 tablet (1 mg total) by mouth 2 (two) times daily., Disp: 60 tablet, Rfl: 1 .  azithromycin (ZITHROMAX) 250 MG tablet, Take as directed, Disp: 6 tablet, Rfl: 0 .  HYDROcodone-homatropine (HYCODAN) 5-1.5 MG/5ML syrup, Take 5 mLs by mouth every 6 (six) hours as needed for cough., Disp: 180 mL, Rfl: 0 .  pantoprazole (PROTONIX) 40 MG tablet, Take 1 tablet (40 mg total) by mouth daily., Disp: 30 tablet, Rfl: 5  Current facility-administered medications:  .  ipratropium-albuterol (DUONEB) 0.5-2.5 (3) MG/3ML nebulizer solution 3 mL, 3 mL, Nebulization, Once,  Adelina Mings, MD   Past Medical History  Diagnosis Date  . Asthma   . Anxiety   . COPD (chronic obstructive pulmonary disease) ([place])   . Angina   . Depression   . Hypertension   . GERD (gastroesophageal reflux disease)   . Arthritis   . Peripheral vascular disease ([place])   . Migraine   . COPD 08/24/2009    Qualifier: Diagnosis of  By: Burnett Kanaris    . Prediabetes 02/23/2014  . HYPERTENSION 05/12/2009    Qualifier: Diagnosis of  By: Lenn Cal Deborra Medina), Wynona Canes    . 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     . ALLERGIC RHINITIS 05/12/2009    Qualifier: Diagnosis of  By: Lenn Cal Deborra Medina), Wynona Canes    . Fibromyalgia 05/14/2014  . Anemia, iron deficiency 12/22/2014  . Urticaria     Allergies  Allergen Reactions  . Sulfa Antibiotics Nausea And Vomiting  . Sulfonamide Derivatives Nausea And Vomiting    REACTION: n/v    Review of Systems Constitutional:   No  weight loss, night sweats,  Fevers, chills, fatigue, or  lassitude.  HEENT:   No headaches,  Difficulty swallowing,  Tooth/dental problems, or  Sore throat,                No sneezing, itching, ear ache,+ nasal congestion, post nasal drip,   CV:  No chest pain,  Orthopnea, PND, swelling in lower extremities, anasarca, dizziness, palpitations, syncope.   GI  No heartburn, indigestion, abdominal pain, nausea, vomiting, diarrhea, change in bowel habits, loss of appetite, bloody stools.   Resp: No shortness of breath with exertion or at rest.  + excess mucus, +productive cough,  No non-productive cough,  No coughing up of blood.  + change in color of mucus.  No wheezing.  No chest wall deformity  Skin: no rash or lesions.  GU: no dysuria, change in color of urine, no urgency or frequency.  No flank pain, no hematuria   MS:  No joint pain or swelling.  No  decreased range of motion.  No back pain.  Psych:  No change in mood or affect. No depression or anxiety.  No memory loss.        Objective:   Physical Exam  BP 124/68 mmHg  Pulse 84  Ht 5\' 3"  (1.6 m)  Wt 160 lb 6.4 oz (72.757 kg)  BMI 28.42 kg/m2  SpO2 94%  Physical Exam:  General- No distress,  A&Ox3,very pleasant ENT: No sinus tenderness, TM clear, pale nasal mucosa, no oral exudate,+ post nasal drip, no LAN Cardiac: S1, S2, regular rate and rhythm, no murmur Chest: No wheeze/ rales/ dullness; no accessory muscle use, no nasal flaring, no sternal retractions Abd.:  Soft Non-tender Ext: No clubbing cyanosis, edema Neuro:  normal strength Skin: No rashes, warm and dry Psych: normal mood and behavior Magdalen Spatz, AGACNP-BC [place] Pager # [phone] 03/27/2016    Assessment & Plan:

## 2016-03-27 NOTE — Patient Instructions (Addendum)
It is nice to meet you today. We will phone in a prescription for Zpack Mucinex 2 tablets twice daily with full glass of water Hydromet cough syrup 1 teaspoon every 6 hours. Sips of water instead of throat clearing Sugar free hard candy to sooth your throat. Continue your Ruthe Mannan, Flonase and Singulair You can add generic claritin for post nasal drip Add protonix 40 mg tablet once daily  Use your mycelex troaches 5 times daily We will re-order your Pro Air inhaler. Continue using your neb treatments as needed. Do not drive if sleepy. Follow up in 3 weeks. Please contact office for sooner follow up if symptoms do not improve or worsen or seek emergency care

## 2016-03-27 NOTE — Assessment & Plan Note (Signed)
Flare with sinusitis/ URI Goal: We will phone in a prescription for Zpack Mucinex 2 tablets twice daily with full glass of water Hydromet cough syrup 1 teaspoon every 6 hours. Sips of water instead of throat clearing Sugar free hard candy to sooth your throat. Continue your Ruthe Mannan, Flonase and Singulair You can add generic claritin for post nasal drip Add protonix 40 mg tablet once daily  Use your mycelex troaches 5 times daily We will re-order your Pro Air inhaler. Continue using your neb treatments as needed. Do not drive if sleepy. Follow up in 3 weeks. Please contact office for sooner follow up if symptoms do not improve or worsen or seek emergency care

## 2016-04-02 ENCOUNTER — Encounter (HOSPITAL_COMMUNITY): Payer: Self-pay | Admitting: Psychiatry

## 2016-04-02 ENCOUNTER — Other Ambulatory Visit (HOSPITAL_COMMUNITY): Payer: Self-pay | Admitting: Psychiatry

## 2016-04-02 ENCOUNTER — Ambulatory Visit (INDEPENDENT_AMBULATORY_CARE_PROVIDER_SITE_OTHER): Payer: Medicare Other | Admitting: Psychiatry

## 2016-04-02 VITALS — BP 128/80 | HR 75 | Ht 63.0 in | Wt 161.6 lb

## 2016-04-02 DIAGNOSIS — F3132 Bipolar disorder, current episode depressed, moderate: Secondary | ICD-10-CM | POA: Diagnosis not present

## 2016-04-02 DIAGNOSIS — F1210 Cannabis abuse, uncomplicated: Secondary | ICD-10-CM | POA: Diagnosis not present

## 2016-04-02 DIAGNOSIS — F4310 Post-traumatic stress disorder, unspecified: Secondary | ICD-10-CM | POA: Diagnosis not present

## 2016-04-02 MED ORDER — QUETIAPINE FUMARATE 100 MG PO TABS: ORAL_TABLET | ORAL | Status: DC
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
## 2016-04-17 ENCOUNTER — Ambulatory Visit: Payer: Self-pay | Admitting: Acute Care

## 2016-04-18 ENCOUNTER — Ambulatory Visit: Payer: Self-pay | Admitting: Internal Medicine

## 2016-04-18 ENCOUNTER — Ambulatory Visit: Payer: Self-pay | Admitting: Acute Care

## 2016-04-24 ENCOUNTER — Ambulatory Visit (HOSPITAL_COMMUNITY): Payer: Self-pay | Admitting: Psychiatry

## 2016-05-01 ENCOUNTER — Ambulatory Visit (INDEPENDENT_AMBULATORY_CARE_PROVIDER_SITE_OTHER): Payer: Medicare Other | Admitting: Clinical

## 2016-05-01 ENCOUNTER — Encounter (HOSPITAL_COMMUNITY): Payer: Self-pay | Admitting: Clinical

## 2016-05-01 DIAGNOSIS — F4310 Post-traumatic stress disorder, unspecified: Secondary | ICD-10-CM | POA: Diagnosis not present

## 2016-05-01 DIAGNOSIS — F250 Schizoaffective disorder, bipolar type: Secondary | ICD-10-CM | POA: Diagnosis not present
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
## 2016-05-04 ENCOUNTER — Ambulatory Visit (INDEPENDENT_AMBULATORY_CARE_PROVIDER_SITE_OTHER): Payer: Medicare Other | Admitting: Psychiatry

## 2016-05-04 ENCOUNTER — Encounter (HOSPITAL_COMMUNITY): Payer: Self-pay | Admitting: Psychiatry

## 2016-05-04 VITALS — BP 128/74 | HR 77 | Ht 63.0 in | Wt 165.4 lb

## 2016-05-04 DIAGNOSIS — F3132 Bipolar disorder, current episode depressed, moderate: Secondary | ICD-10-CM | POA: Diagnosis not present

## 2016-05-04 MED ORDER — QUETIAPINE FUMARATE 100 MG PO TABS: 100.0000 mg | ORAL_TABLET | Freq: Every day | ORAL | Status: DC
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
## 2016-05-07 ENCOUNTER — Ambulatory Visit (HOSPITAL_COMMUNITY): Payer: Self-pay | Admitting: Clinical

## 2016-05-08 ENCOUNTER — Ambulatory Visit: Payer: Medicare Other | Admitting: Family Medicine

## 2016-05-15 ENCOUNTER — Encounter: Payer: Self-pay | Admitting: Emergency Medicine

## 2016-05-16 ENCOUNTER — Telehealth: Payer: Self-pay | Admitting: *Deleted

## 2016-05-16 ENCOUNTER — Encounter: Payer: Self-pay | Admitting: Allergy and Immunology

## 2016-05-16 ENCOUNTER — Ambulatory Visit (INDEPENDENT_AMBULATORY_CARE_PROVIDER_SITE_OTHER): Payer: Medicare Other | Admitting: Allergy and Immunology

## 2016-05-16 VITALS — BP 140/90 | HR 80 | Temp 98.2°F | Resp 24

## 2016-05-16 DIAGNOSIS — J45901 Unspecified asthma with (acute) exacerbation: Secondary | ICD-10-CM

## 2016-05-16 DIAGNOSIS — L500 Allergic urticaria: Secondary | ICD-10-CM | POA: Diagnosis not present

## 2016-05-16 DIAGNOSIS — J0110 Acute frontal sinusitis, unspecified: Secondary | ICD-10-CM

## 2016-05-16 DIAGNOSIS — J441 Chronic obstructive pulmonary disease with (acute) exacerbation: Secondary | ICD-10-CM

## 2016-05-16 DIAGNOSIS — J019 Acute sinusitis, unspecified: Secondary | ICD-10-CM | POA: Insufficient documentation

## 2016-05-16 DIAGNOSIS — J3089 Other allergic rhinitis: Secondary | ICD-10-CM | POA: Diagnosis not present

## 2016-05-16 MED ORDER — MOMETASONE FURO-FORMOTEROL FUM 200-5 MCG/ACT IN AERO: INHALATION_SPRAY | RESPIRATORY_TRACT | Status: DC

## 2016-05-16 MED ORDER — PREDNISONE 1 MG PO TABS: 10.0000 mg | ORAL_TABLET | ORAL | Status: DC

## 2016-05-16 NOTE — Progress Notes (Signed)
Follow-up Note  RE: [name] MRN: [id number] DOB: Jan 21, 1961 Date of Office Visit: 05/16/2016  Primary care provider: Cathlean Cower, MD Referring provider: Biagio Borg, MD  History of present illness: HPI Comments: [name] is a 55 y.o. female with asthma/COPD, allergic rhinitis, and history of urticaria who presents today for a sick visit. She was seen in this office on 02/21/2016.  She reports that over the past 2 weeks she has experienced frontal sinus pressure, nasal congestion, nasal pruritus, and ear fullness/pressure.  She has not experienced fevers, chills, or discolored mucus production.  She also complains of increased dyspnea/wheezing and albuterol requirement when she goes outdoors and/or with mild to moderate exertion, i.e., climbing 1 flight of stairs.  In addition, over the past 2 weeks she has experienced increased generalized urticaria and pruritus, particularly on her face and under her breasts.  She currently takes hydroxyzine 50 mg at bedtime, though recently this has been inadequate to suppress her symptoms.     Assessment and plan: Acute exacerbation of COPD with asthma ([place])  Prednisone has been provided, 40 mg x3 days, 20 mg x1 day, 10 mg x1 day, then stop.  Continue Dulera 200/5 g, 2 inhalations via spacer device twice a day, montelukast 10 mg daily bedtime, and albuterol every 4-6 hours as needed.  Subjective and objective measures of pulmonary function will be followed and the treatment plan will be adjusted accordingly.  Acute sinusitis  Prednisone has been provided (as above).  Nasal saline lavage (NeilMed) as needed has been recommended along with instructions for proper administration.  Continue fluticasone nasal spray, 2 sprays per nostril daily as needed.  The patient has been asked to contact me if her symptoms persist, progress, or if she becomes febrile. Otherwise, she may return for follow up in 4  months.  Urticaria  Instructions have been discussed and provided for H1/H2 receptor blockade with titration to find lowest effective dose.  The patient may continue to use hydroxyzine 25 mg at bedtime if needed for breakthrough symptoms.  Allergic rhinitis  A prescription has been provided for azelastine nasal spray, 1-2 sprays per nostril 2 times daily as needed.   Continue appropriate allergen avoidance measures, fluticasone nasal spray as needed, azelastine nasal spray as needed, and nasal irrigation.  Nasal saline lavage (NeilMed) as needed has been recommended along with instructions for proper administration.    Meds ordered this encounter  Medications  . mometasone-formoterol (DULERA) 200-5 MCG/ACT AERO    Sig: INHALE 2 PUFFS INTO THE LUNGS TWICE DAILY    Dispense:  39 g    Refill:  1    **Patient requests 90 days supply**  . predniSONE (DELTASONE) tablet 10 mg    Sig:     Diagnositics: Spirometry reveals an FVC of 1.28 L (50% predicted) and an FEV1 of 0.72 L (34% 80) without post bronchodilator improvement.  Please see scanned spirometry results for details.    Physical examination: Blood pressure 140/90, pulse 80, temperature 98.2 F (36.8 C), temperature source Oral, resp. rate 24.  General: Alert, interactive, in no acute distress. HEENT: TMs pearly gray, turbinates edematous with thick discharge, post-pharynx erythematous. Neck: Supple without lymphadenopathy. Lungs: Decreased breath sounds with expiratory wheezing bilaterally. CV: Normal S1, S2 without murmurs. Skin: Warm and dry, without lesions or rashes.  The following portions of the patient's history were reviewed and updated as appropriate: allergies, current medications, past family history, past medical history, past social history, past surgical history and  problem list.    Medication List       This list is accurate as of: 05/16/16  1:01 PM.  Always use your most recent med list.                albuterol (2.5 MG/3ML) 0.083% nebulizer solution  Commonly known as:  PROVENTIL  Take 2.5 mg by nebulization every 4 (four) hours as needed for wheezing or shortness of breath.     albuterol 108 (90 Base) MCG/ACT inhaler  Commonly known as:  VENTOLIN HFA  One to two puffs every 4 to 6 hours as needed     azelastine 0.1 % nasal spray  Commonly known as:  ASTELIN  USE 1-2 SPRAYS IN EACH NOSTRIL TWICE DAILY AS DIRECTED     cholecalciferol 1000 units tablet  Commonly known as:  VITAMIN D  Take 1,000 Units by mouth daily.     clotrimazole 10 MG troche  Commonly known as:  MYCELEX  Slowly dissolve in the mouth 5 times daily for 10 days     cyclobenzaprine 5 MG tablet  Commonly known as:  FLEXERIL  Take 1 tablet (5 mg total) by mouth 3 (three) times daily as needed for muscle spasms.     diphenhydrAMINE 25 MG tablet  Commonly known as:  BENADRYL  Take 25 mg by mouth every 6 (six) hours as needed.     fluticasone 50 MCG/ACT nasal spray  Commonly known as:  FLONASE  Place 2 sprays into both nostrils 2 (two) times daily.     gabapentin 100 MG capsule  Commonly known as:  NEURONTIN  TAKE 2 CAPSULES BY MOUTH AT BEDTIME     hydrOXYzine 25 MG capsule  Commonly known as:  VISTARIL  TAKE ONE CAPSULE BY MOUTH THREE TIMES DAILY AS NEEDED     irbesartan-hydrochlorothiazide 150-12.5 MG tablet  Commonly known as:  AVALIDE  TAKE 1 TABLET BY MOUTH EVERY DAY     meloxicam 15 MG tablet  Commonly known as:  MOBIC  Take 1 tablet (15 mg total) by mouth daily.     mometasone-formoterol 200-5 MCG/ACT Aero  Commonly known as:  DULERA  INHALE 2 PUFFS INTO THE LUNGS TWICE DAILY     montelukast 10 MG tablet  Commonly known as:  SINGULAIR  Take 1 tablet (10 mg total) by mouth at bedtime.     NONFORMULARY OR COMPOUNDED ITEM  Estradiol 0.02 % 44ml prefilled applicator Sig: apply twice a week     pantoprazole 40 MG tablet  Commonly known as:  PROTONIX  Take 1 tablet (40 mg total) by mouth  daily.     QUEtiapine 100 MG tablet  Commonly known as:  SEROQUEL  Take 1 tablet (100 mg total) by mouth at bedtime.     tiotropium 18 MCG inhalation capsule  Commonly known as:  SPIRIVA HANDIHALER  Place 1 capsule (18 mcg total) into inhaler and inhale daily.     tiZANidine 4 MG tablet  Commonly known as:  ZANAFLEX  Take 1 tablet (4 mg total) by mouth every 6 (six) hours as needed for muscle spasms.     traMADol 50 MG tablet  Commonly known as:  ULTRAM  Take 1 tablet (50 mg total) by mouth every 6 (six) hours as needed. For pain.        Allergies  Allergen Reactions  . Sulfa Antibiotics Nausea And Vomiting  . Sulfonamide Derivatives Nausea And Vomiting    REACTION: n/v   Review of systems:  Constitutional: Negative for fever, chills and weight loss.  HENT: Negative for nosebleeds.   Positive for nasal congestion, sinus pressure, ear pressure. Eyes: Negative for blurred vision.  Respiratory: Negative for hemoptysis.   Positive for dyspnea and wheezing. Cardiovascular: Negative for chest pain.  Gastrointestinal: Negative for diarrhea and constipation.  Genitourinary: Negative for dysuria.  Musculoskeletal: Negative for myalgias and joint pain.  Neurological: Negative for dizziness.  Endo/Heme/Allergies: Does not bruise/bleed easily.  Cutaneous: Positive for urticaria and pruritus.  Past Medical History  Diagnosis Date  . Asthma   . Anxiety   . COPD (chronic obstructive pulmonary disease) ([place])   . Angina   . Depression   . Hypertension   . GERD (gastroesophageal reflux disease)   . Arthritis   . Peripheral vascular disease ([place])   . Migraine   . COPD 08/24/2009    Qualifier: Diagnosis of  By: Burnett Kanaris    . Prediabetes 02/23/2014  . HYPERTENSION 05/12/2009    Qualifier: Diagnosis of  By: Lenn Cal Deborra Medina), Wynona Canes    . ASTHMA 05/12/2009    Severe AFL (Spirometry 05/2009: pre-BD FEV1 0.87L 34% pred, post-BD FEV1 1.11L 44% pred) Volumes hyperinflated Decreased  DLCO that does not fully correct to normal range for alveolar volume.     . ALLERGIC RHINITIS 05/12/2009    Qualifier: Diagnosis of  By: Lenn Cal Deborra Medina), Wynona Canes    . Fibromyalgia 05/14/2014  . Anemia, iron deficiency 12/22/2014  . Urticaria     Family History  Problem Relation Age of Onset  . Diabetes Mother   . COPD Mother   . Heart disease Mother   . Asthma Mother   . Diabetes Father   . Kidney disease Father   . Cancer Father   . Anesthesia problems Father   . Alcohol abuse Father   . Diabetes Sister   . Hypertension Sister   . Diabetes Brother   . Sleep apnea Brother   . Asthma Brother   . Alcohol abuse Brother   . Heart disease Sister   . Diabetes Sister   . Heart disease Brother   . Asthma Sister   . Allergic rhinitis Neg Hx   . Eczema Neg Hx   . Urticaria Neg Hx     Social History   Social History  . Marital Status: Divorced    Spouse Name: N/A  . Number of Children: N/A  . Years of Education: N/A   Occupational History  . Not on file.   Social History Main Topics  . Smoking status: Former Smoker -- 0.50 packs/day for 36 years    Types: Cigarettes    Quit date: 02/27/2016  . Smokeless tobacco: Never Used     Comment: Pt states she is "an off and on smoker."    . Alcohol Use: No     Comment: drinks red wine occasionally  . Drug Use: Yes    Special: Marijuana  . Sexual Activity: Yes    Birth Control/ Protection: Surgical   Other Topics Concern  . Not on file   Social History Narrative   Lives alone   Lives apt, 3 stories up; goes up and down stairs every day;    Generally climbs flights of stairs 2 times a day    I appreciate the opportunity to take part in this Jeanie's care. Please do not hesitate to contact me with questions.  Sincerely,   R. Edgar Frisk, MD

## 2016-05-16 NOTE — Assessment & Plan Note (Signed)
   Instructions have been discussed and provided for H1/H2 receptor blockade with titration to find lowest effective dose.  The patient may continue to use hydroxyzine 25 mg at bedtime if needed for breakthrough symptoms.

## 2016-05-16 NOTE — Assessment & Plan Note (Signed)
   Prednisone has been provided, 40 mg x3 days, 20 mg x1 day, 10 mg x1 day, then stop.  Continue Dulera 200/5 g, 2 inhalations via spacer device twice a day, montelukast 10 mg daily bedtime, and albuterol every 4-6 hours as needed.  Subjective and objective measures of pulmonary function will be followed and the treatment plan will be adjusted accordingly.

## 2016-05-16 NOTE — Assessment & Plan Note (Signed)
   A prescription has been provided for azelastine nasal spray, 1-2 sprays per nostril 2 times daily as needed.   Continue appropriate allergen avoidance measures, fluticasone nasal spray as needed, azelastine nasal spray as needed, and nasal irrigation.  Nasal saline lavage (NeilMed) as needed has been recommended along with instructions for proper administration.

## 2016-05-16 NOTE — Telephone Encounter (Signed)
Yes, switch to Symbicort 160/4.5 g, 2 inhalations via spacer device twice a day.  Thanks.

## 2016-05-16 NOTE — Telephone Encounter (Signed)
Patient called states Dulera 200 is over $200 and cannot afford it. Please advise contacted pharmacy states Symbicort 160 is only $3.70 a month.

## 2016-05-16 NOTE — Patient Instructions (Addendum)
Acute exacerbation of COPD with asthma ([place])  Prednisone has been provided, 40 mg x3 days, 20 mg x1 day, 10 mg x1 day, then stop.  Continue Dulera 200/5 g, 2 inhalations via spacer device twice a day, montelukast 10 mg daily bedtime, and albuterol every 4-6 hours as needed.  Subjective and objective measures of pulmonary function will be followed and the treatment plan will be adjusted accordingly.  Acute sinusitis  Prednisone has been provided (as above).  Nasal saline lavage (NeilMed) as needed has been recommended along with instructions for proper administration.  Continue fluticasone nasal spray, 2 sprays per nostril daily as needed.  The patient has been asked to contact me if her symptoms persist, progress, or if she becomes febrile. Otherwise, she may return for follow up in 4 months.  Urticaria  Instructions have been discussed and provided for H1/H2 receptor blockade with titration to find lowest effective dose.  The patient may continue to use hydroxyzine 25 mg at bedtime if needed for breakthrough symptoms.  Allergic rhinitis  A prescription has been provided for azelastine nasal spray, 1-2 sprays per nostril 2 times daily as needed.   Continue appropriate allergen avoidance measures, fluticasone nasal spray as needed, azelastine nasal spray as needed, and nasal irrigation.  Nasal saline lavage (NeilMed) as needed has been recommended along with instructions for proper administration.    Return in about 4 months (around 09/15/2016), or if symptoms worsen or fail to improve.  Urticaria (Hives)  . Levocetirizine (Xyzal) 5 mg twice a day and ranitidine (Zantac) 150 mg twice a day. If no symptoms for 7-14 days then decrease to. . Levocetirizine (Xyzal) 5 mg twice a day and ranitidine (Zantac) 150 mg once a day.  If no symptoms for 7-14 days then decrease to. . Levocetirizine (Xyzal) 5 mg twice a day.  If no symptoms for 7-14 days then decrease to. . Levocetirizine  (Xyzal) 5 mg once a day.  May use Benadryl (diphenhydramine) as needed for breakthrough symptoms       If symptoms return, then step up dosage

## 2016-05-16 NOTE — Assessment & Plan Note (Addendum)
   Prednisone has been provided (as above).  Nasal saline lavage (NeilMed) as needed has been recommended along with instructions for proper administration.  Continue fluticasone nasal spray, 2 sprays per nostril daily as needed.  The patient has been asked to contact me if her symptoms persist, progress, or if she becomes febrile. Otherwise, she may return for follow up in 4 months.

## 2016-05-17 ENCOUNTER — Encounter: Payer: Self-pay | Admitting: Family Medicine

## 2016-05-17 ENCOUNTER — Ambulatory Visit (INDEPENDENT_AMBULATORY_CARE_PROVIDER_SITE_OTHER)
Admission: RE | Admit: 2016-05-17 | Discharge: 2016-05-17 | Disposition: A | Discharge status: Home / Self Care | Payer: Medicare Other | Source: Ambulatory Visit | Attending: Family Medicine | Admitting: Family Medicine

## 2016-05-17 ENCOUNTER — Other Ambulatory Visit: Payer: Self-pay | Admitting: *Deleted

## 2016-05-17 ENCOUNTER — Other Ambulatory Visit: Payer: Self-pay

## 2016-05-17 ENCOUNTER — Ambulatory Visit (INDEPENDENT_AMBULATORY_CARE_PROVIDER_SITE_OTHER): Payer: Medicare Other | Admitting: Family Medicine

## 2016-05-17 ENCOUNTER — Encounter: Payer: Self-pay | Admitting: Allergy and Immunology

## 2016-05-17 VITALS — BP 132/84 | HR 100 | Ht 63.0 in | Wt 167.0 lb

## 2016-05-17 DIAGNOSIS — M7061 Trochanteric bursitis, right hip: Secondary | ICD-10-CM | POA: Diagnosis not present

## 2016-05-17 DIAGNOSIS — M25551 Pain in right hip: Secondary | ICD-10-CM

## 2016-05-17 DIAGNOSIS — M25552 Pain in left hip: Principal | ICD-10-CM

## 2016-05-17 DIAGNOSIS — M7062 Trochanteric bursitis, left hip: Secondary | ICD-10-CM

## 2016-05-17 MED ORDER — BUDESONIDE-FORMOTEROL FUMARATE 160-4.5 MCG/ACT IN AERO: 2.0000 | INHALATION_SPRAY | Freq: Two times a day (BID) | RESPIRATORY_TRACT | Status: DC

## 2016-05-17 NOTE — Patient Instructions (Signed)
Good to see you We will get xray of your pelvis to make sure your hip bones are good Injected both hips again today  If not a lot better we will need to consider looking at your back and likely will need to consider MRI of your back So in 1 week send me a message.  If doing well I will see you when you need me but if worse we will get MRI of your back and I would want to see you 1-2 days after the MRI.  pennsaid pinkie amount topically 2 times daily as needed.

## 2016-05-17 NOTE — Progress Notes (Signed)
[name] [address], [place] [address] Phone: [phone] Subjective:    I'm seeing this patient by the request  of:  Cathlean Cower, MD   CC: bilateral hip pain Follow-up  [id number] [name] is a 55 y.o. female coming in with complaint of bilateral hip pain. Patient states that the pain is on the lateral aspect of the hips.   Patient was last seen 4 months ago and was concern for a lumbar radiculopathy but as well as a greater trochanteric bursitis. Patient was given injections in the hips bilaterally laterally. Did respond very well. Was doing well until the last 2 weeks. Having very similar presentation again. States though that sometimes she can also have bilateral groin pain. Patient states that this is a little different than previously. States that it seems to be worse with certain activities. Still when she rolls onto the side of her hips at night it can wake her up. Mild radiation down the legs. No numbness or weakness that is constant. Mild increase in back discomfort. Patient did have x-rays of her lumbar spine at last exam that were independently visualized by me showing multilevel degenerative changes.  Past Medical History  Diagnosis Date  . Asthma   . Anxiety   . COPD (chronic obstructive pulmonary disease) ([place])   . Angina   . Depression   . Hypertension   . GERD (gastroesophageal reflux disease)   . Arthritis   . Peripheral vascular disease ([place])   . Migraine   . COPD 08/24/2009    Qualifier: Diagnosis of  By: Burnett Kanaris    . Prediabetes 02/23/2014  . HYPERTENSION 05/12/2009    Qualifier: Diagnosis of  By: Lenn Cal Deborra Medina), Wynona Canes    . 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     . ALLERGIC RHINITIS 05/12/2009    Qualifier: Diagnosis of  By:  Lenn Cal Deborra Medina), Wynona Canes    . Fibromyalgia 05/14/2014  . Anemia, iron deficiency 12/22/2014  . Urticaria    Past Surgical History  Procedure Laterality Date  . Neck surgery    . Toe surgery    . Abdominal hysterectomy    . Tubal ligation    . Kidney stone surgery    . Hemorrhoid surgery    . Incise and drain abcess    . Back surgery    . Diagnostic laparoscopy    . Colonoscopy  12/20/2011    Procedure: COLONOSCOPY;  Surgeon: Landry Dyke, MD;  Location: [facility];  Service: Endoscopy;  Laterality: N/A;  . Colonoscopy  03/05/2012    Procedure: COLONOSCOPY;  Surgeon: Landry Dyke, MD;  Location: [facility];  Service: Endoscopy;  Laterality: N/A;   Social History  Substance Use Topics  . Smoking status: Former Smoker -- 0.50 packs/day for 36 years    Types: Cigarettes    Quit date: 02/27/2016  . Smokeless tobacco: Never Used     Comment: Pt states she is "an off and on smoker."    . Alcohol Use: No     Comment: drinks red wine occasionally   Allergies  Allergen Reactions  . Sulfa Antibiotics Nausea And Vomiting  . Sulfonamide Derivatives Nausea And Vomiting    REACTION: n/v   Social History  Substance Use Topics  . Smoking status: Former Smoker -- 0.50  packs/day for 36 years    Types: Cigarettes    Quit date: 02/27/2016  . Smokeless tobacco: Never Used     Comment: Pt states she is "an off and on smoker."    . Alcohol Use: No     Comment: drinks red wine occasionally   Family History  Problem Relation Age of Onset  . Diabetes Mother   . COPD Mother   . Heart disease Mother   . Asthma Mother   . Diabetes Father   . Kidney disease Father   . Cancer Father   . Anesthesia problems Father   . Alcohol abuse Father   . Diabetes Sister   . Hypertension Sister   . Diabetes Brother   . Sleep apnea Brother   . Asthma Brother   . Alcohol abuse Brother   . Heart disease Sister   . Diabetes Sister   . Heart disease Brother   . Asthma Sister   . Allergic  rhinitis Neg Hx   . Eczema Neg Hx   . Urticaria Neg Hx         Past medical history, social, surgical and family history all reviewed in electronic medical record.   Review of Systems: No headache, visual changes, nausea, vomiting, diarrhea, constipation, dizziness, abdominal pain, skin rash, fevers, chills, night sweats, weight loss, swollen lymph nodes, body aches, joint swelling, muscle aches, chest pain, shortness of breath, mood changes.   Objective Blood pressure 132/84, pulse 100, height 5\' 3"  (1.6 m), weight 167 lb (75.751 kg), SpO2 98 %.  General: No apparent distress alert and oriented x3 mood and affect normal, dressed appropriately.  HEENT: Pupils equal, extraocular movements intact  Respiratory: Patient's speak in full sentences and does not appear short of breath  Cardiovascular: No lower extremity edema, non tender, no erythema  Skin: Warm dry intact with no signs of infection or rash on extremities or on axial skeleton.  Abdomen: Soft nontender  Neuro: Cranial nerves II through XII are intact, neurovascularly intact in all extremities with 2+ DTRs and 2+ pulses.  Lymph: No lymphadenopathy of posterior or anterior cervical chain or axillae bilaterally.  Gait Antalgic gait MSK:  Non tender with full range of motion and good stability and symmetric strength and tone of shoulders, elbows, wrist,  knee and ankles bilaterally. Mild osteoarthritic changes of multiple joints significant healing tender and multiple different joints.  Back Exam:  Inspection: Unremarkable  Motion: Flexion 45 deg, Extension 15 deg, Side Bending to 45 deg bilaterally,  Rotation to 45 deg bilaterally  SLR laying: Negative bilaterally which is an improvement XSLR laying: Negative  Palpable tenderness: Diffuse tenderness to palpation in the paraspinal musculature bilaterally.Marland Kitchen FABER: Positive bilateral. Sensory change: Gross sensation intact to all lumbar and sacral dermatomes.  Reflexes: 2+ at both  patellar tendons, 2+ at achilles tendons, Babinski's downgoing.  Strength at foot  Plantar-flexion: 5/5 Dorsi-flexion: 5/5 Eversion: 5/5 Inversion: 5/5  Leg strength  Quad: 5/5 Hamstring: 5/5 Hip flexor: 5/5 Hip abductors: 5/5  Gait unremarkabl [id number] ROM IR: 15 DegWith pain with internal rotation which is new., ER: 35 Deg, Flexion: 100 Deg, Extension: 80 Deg, Abduction: 45 Deg, Adduction: 45 Deg Strength IR: /5, ER: 5/5, Flexion: 5/5, Extension: 5/5, Abduction: 5/5, Adduction: 5/5 Pelvic alignment unremarkable to inspection and palpation. Standing hip rotation and gait without trendelenburg sign / unsteadiness. Severe tenderness over the greater trochanteric bursitis bilaterally. Positive Faber bilaterally with pain on the lateral aspect of the hips. Patient does have new pain  with internal rotation of the hips left greater than right. No SI joint tenderness and normal minimal SI movement. Negative straight leg test bilaterally No change from previous exam   Procedure: Real-time Ultrasound Guided Injection of right greater trochanteric bursitis secondary to patient's body habitus Device: GE Logiq E  Ultrasound guided injection is preferred based studies that show increased duration, increased effect, greater accuracy, decreased procedural pain, increased response rate, and decreased cost with ultrasound guided versus blind injection.  Verbal informed consent obtained.  Time-out conducted.  Noted no overlying erythema, induration, or other signs of local infection.  Skin prepped in a sterile fashion.  Local anesthesia: Topical Ethyl chloride.  With sterile technique and under real time ultrasound guidance:  Greater trochanteric area was visualized and patient's bursa was noted. A 22-gauge 3 inch needle was inserted and 4 cc of 0.5% Marcaine and 1 cc of Kenalog 40 mg/dL was injected. Pictures taken Completed without difficulty  Pain immediately resolved suggesting accurate  placement of the medication.  Advised to call if fevers/chills, erythema, induration, drainage, or persistent bleeding.  Images permanently stored and available for review in the ultrasound unit.  Impression: Technically successful ultrasound guided injection.   Procedure: Real-time Ultrasound Guided Injection of left  greater trochanteric bursitis secondary to patient's body habitus Device: GE Logiq E  Ultrasound guided injection is preferred based studies that show increased duration, increased effect, greater accuracy, decreased procedural pain, increased response rate, and decreased cost with ultrasound guided versus blind injection.  Verbal informed consent obtained.  Time-out conducted.  Noted no overlying erythema, induration, or other signs of local infection.  Skin prepped in a sterile fashion.  Local anesthesia: Topical Ethyl chloride.  With sterile technique and under real time ultrasound guidance:  Greater trochanteric area was visualized and patient's bursa was noted. A 22-gauge 3 inch needle was inserted and 4 cc of 0.5% Marcaine and 1 cc of Kenalog 40 mg/dL was injected. Pictures taken Completed without difficulty  Pain immediately resolved suggesting accurate placement of the medication.  Advised to call if fevers/chills, erythema, induration, drainage, or persistent bleeding.  Images permanently stored and available for review in the ultrasound unit.  Impression: Technically successful ultrasound guided injection.      Impression and Recommendations:     This case required medical decision making of moderate complexity.

## 2016-05-17 NOTE — Progress Notes (Signed)
Pre visit review using our clinic review tool, if applicable. No additional management support is needed unless otherwise documented below in the visit note. 

## 2016-05-17 NOTE — Telephone Encounter (Signed)
Symbicort 160 sent to pharmacy patient advised

## 2016-05-17 NOTE — Telephone Encounter (Signed)
Sent in symbicort and informed pt

## 2016-05-17 NOTE — Assessment & Plan Note (Signed)
Patient is now having bilateral hip pain medial and lateral. Hopefully that this will be beneficial after the injections. Patient continues to have pain we may need to consider advanced imaging including MRI of the lumbar spine with the degenerative changes found. No straight leg test today but did have it in the past. Pelvis x-ray is pending. Continue medications as prescribed. Topical anti-inflammatories given.

## 2016-05-17 NOTE — Assessment & Plan Note (Signed)
Bilateral injections again. I'm concerned that some of this could be lumbar radiculopathy. Patient's pain with internal rotation with groin pain is concerning for possible underlying arthritis. Pelvis x-ray ordered today. We discussed icing regimen and given topical anti-inflammatories. We discussed which activities to do in which ones to avoid. Patient declined formal physical therapy. Patient come back and see me again in 3-4 weeks for further evaluation and treatment.

## 2016-05-22 ENCOUNTER — Other Ambulatory Visit: Payer: Self-pay

## 2016-05-22 DIAGNOSIS — J45901 Unspecified asthma with (acute) exacerbation: Secondary | ICD-10-CM

## 2016-05-22 DIAGNOSIS — J441 Chronic obstructive pulmonary disease with (acute) exacerbation: Secondary | ICD-10-CM

## 2016-05-22 MED ORDER — MOMETASONE FURO-FORMOTEROL FUM 200-5 MCG/ACT IN AERO: INHALATION_SPRAY | RESPIRATORY_TRACT | Status: DC

## 2016-05-25 ENCOUNTER — Encounter: Payer: Self-pay | Admitting: Pulmonary Disease

## 2016-05-25 ENCOUNTER — Ambulatory Visit (INDEPENDENT_AMBULATORY_CARE_PROVIDER_SITE_OTHER): Payer: Medicare Other | Admitting: Pulmonary Disease

## 2016-05-25 VITALS — BP 118/86 | HR 66 | Ht 63.0 in | Wt 164.2 lb

## 2016-05-25 DIAGNOSIS — J441 Chronic obstructive pulmonary disease with (acute) exacerbation: Secondary | ICD-10-CM | POA: Diagnosis not present

## 2016-05-25 DIAGNOSIS — F17200 Nicotine dependence, unspecified, uncomplicated: Secondary | ICD-10-CM

## 2016-05-25 DIAGNOSIS — Z720 Tobacco use: Secondary | ICD-10-CM

## 2016-05-25 DIAGNOSIS — J45901 Unspecified asthma with (acute) exacerbation: Secondary | ICD-10-CM

## 2016-05-25 NOTE — Patient Instructions (Addendum)
Symbicort (Red) is your maintenance inhaler-use 2 puffs twice daily at 6 times  Ventolin is your rescue inhaler-use 2 puffs every 4 hours as needed

## 2016-05-25 NOTE — Assessment & Plan Note (Signed)
She continues to smoke Smoking cessation is again emphasized

## 2016-05-25 NOTE — Progress Notes (Signed)
   Subjective:    Patient ID: [name], female    DOB: 1961/02/25, 55 y.o.   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 Lamonte Sakai  05/25/2016  Chief Complaint  Patient presents with  . Acute Visit    Pt c/o SOB x 1 day. Pt states that she was awakened several times last night gasping for air. Notes some tightness in mid-upper back. Pt states that her exertional SOB is much worse. Uses Albuterol neb and gets some relief but not 100%. Still using Spiriva and Symbicort 160.- needs alterntive to Symbicort, too expensive.   She reports sudden onset dyspnea when she climbs stairs yesterday She continues to smoke one to 2 cigarettes a day She received some disturbing news about her brother who had paralysis following surgery  She took Symbicort for rescue and albuterol and was able to sleep She again took albuterol nebs this morning and feels much better again She does report exposure to some perfumes  Significant tests/ events  PFT August 2014 with an post BD FEV1 at 46%, ratio 53, mild bronchodilator response, FVC 70%, DLCO 59%  Review of Systems Patient denies significant dyspnea,cough, hemoptysis,  chest pain, palpitations, pedal edema, orthopnea, paroxysmal nocturnal dyspnea, lightheadedness, nausea, vomiting, abdominal or  leg pains      Objective:   Physical Exam  Gen. Pleasant, obese, in no distress ENT - no lesions, no post nasal drip Neck: No JVD, no thyromegaly, no carotid bruits Lungs: no use of accessory muscles, no dullness to percussion, decreased without rales or rhonchi  Cardiovascular: Rhythm regular, heart sounds  normal, no murmurs or gallops, no peripheral edema Musculoskeletal: No deformities, no cyanosis or clubbing , no tremors       Assessment & Plan:

## 2016-05-25 NOTE — Assessment & Plan Note (Signed)
May have been related to exposure to fragrance or psychological stressors This appears to have resolved now I clarified the difference between her maintenance inhaler-Symbicort and her rescue -Ventolin

## 2016-05-25 NOTE — Addendum Note (Signed)
Addended by: Virl Cagey on: 05/25/2016 09:37 AM   Modules accepted: Orders

## 2016-05-29 ENCOUNTER — Telehealth: Payer: Self-pay

## 2016-05-29 ENCOUNTER — Ambulatory Visit: Payer: Medicare Other | Admitting: Allergy and Immunology

## 2016-05-29 NOTE — Telephone Encounter (Signed)
Call to [name] and stated she was getting her meds confused; Would like another AWV; Agreed to come in 6/19 at 2: 30; apt made. Is to bring meds

## 2016-06-04 ENCOUNTER — Ambulatory Visit (INDEPENDENT_AMBULATORY_CARE_PROVIDER_SITE_OTHER): Payer: Medicare Other

## 2016-06-04 VITALS — BP 130/80 | HR 67 | Ht 63.0 in | Wt 165.8 lb

## 2016-06-04 DIAGNOSIS — R7309 Other abnormal glucose: Secondary | ICD-10-CM

## 2016-06-04 DIAGNOSIS — Z0000 Encounter for general adult medical examination without abnormal findings: Secondary | ICD-10-CM | POA: Diagnosis not present
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
## 2016-06-04 NOTE — Patient Instructions (Addendum)
[name] , Thank you for taking time to come for your Medicare Wellness Visit. I appreciate your ongoing commitment to your health goals. Please review the following plan we discussed and let me know if I can assist you in the future.  Reviewed for annual vision exam;  [employer] assistance for eyewear is coordinated through [employer]; Please call Denise Haynes at [phone]  Smoking;  Educated to avoid secondary smoke Smoking cessation at [facility]: [phone] Classes offered a couple of times a month;  Will work with the patient as far as registration and location  Meds may help; chatix (Varenicline); Zyban (Bupropion SR); Nicotine Replacement (gum; lozenges; patches; etc.) 30 pack yr smoking hx: Educated regarding LDCT; To discuss with MD at next fup. Also educated on AAA screening for men 65-75 who have smoked [place] quit line   Also given resources on dental services in the area; tooth ache now.     [facility]  1-800-QUIT-NOW  1-800-784-8669   (given verbal direction to stop by the pharmacy to clarify her nebulizer med )    These are the goals we discussed: Goals    . Exercise 150 minutes per week (moderate activity)     Walks with grandson; lives at apt with gym; 3 to 4 days a week. Gets on the treadmill; has a buddy; going up and down stairs    . Quit smoking / using tobacco     Referred to [place] and given information on quitting       This is a list of the screening recommended for you and due dates:  Health Maintenance  Topic Date Due  . Complete foot exam   12/20/1972  . Eye exam for diabetics  12/20/1972  . Hemoglobin A1C  06/15/2014  . Flu Shot  07/17/2016  . Pap Smear  02/23/2017  . Mammogram  07/20/2017  . Pneumococcal vaccine (2) 12/01/2020  . Colon Cancer Screening  03/05/2022  . Tetanus Vaccine  04/22/2024  .  Hepatitis C: One time screening is recommended by Center for Disease Control  (CDC) for  adults born from 1945 through 1965.   Completed   . HIV Screening  Completed      Fall Prevention in the Home  Falls can cause injuries. They can happen to people of all ages. There are many things you can do to make your home safe and to help prevent falls.  WHAT CAN I DO ON THE OUTSIDE OF MY HOME?  Regularly fix the edges of walkways and driveways and fix any cracks.  Remove anything that might make you trip as you walk through a door, such as a raised step or threshold.  Trim any bushes or trees on the path to your home.  Use bright outdoor lighting.  Clear any walking paths of anything that might make someone trip, such as rocks or tools.  Regularly check to see if handrails are loose or broken. Make sure that both sides of any steps have handrails.  Any raised decks and porches should have guardrails on the edges.  Have any leaves, snow, or ice cleared regularly.  Use sand or salt on walking paths during winter.  Clean up any spills in your garage right away. This includes oil or grease spills. WHAT CAN I DO IN THE BATHROOM?   Use night lights.  Install grab bars by the toilet and in the tub and shower. Do not use towel bars as grab bars.  Use non-skid mats or decals in   the tub or shower.  If you need to sit down in the shower, use a plastic, non-slip stool.  Keep the floor dry. Clean up any water that spills on the floor as soon as it happens.  Remove soap buildup in the tub or shower regularly.  Attach bath mats securely with double-sided non-slip rug tape.  Do not have throw rugs and other things on the floor that can make you trip. WHAT CAN I DO IN THE BEDROOM?  Use night lights.  Make sure that you have a light by your bed that is easy to reach.  Do not use any sheets or blankets that are too big for your bed. They should not hang down onto the floor.  Have a firm chair that has side arms. You can use this for support while you get dressed.  Do not have throw rugs and other things on the floor that  can make you trip. WHAT CAN I DO IN THE KITCHEN?  Clean up any spills right away.  Avoid walking on wet floors.  Keep items that you use a lot in easy-to-reach places.  If you need to reach something above you, use a strong step stool that has a grab bar.  Keep electrical cords out of the way.  Do not use floor polish or wax that makes floors slippery. If you must use wax, use non-skid floor wax.  Do not have throw rugs and other things on the floor that can make you trip. WHAT CAN I DO WITH MY STAIRS?  Do not leave any items on the stairs.  Make sure that there are handrails on both sides of the stairs and use them. Fix handrails that are broken or loose. Make sure that handrails are as long as the stairways.  Check any carpeting to make sure that it is firmly attached to the stairs. Fix any carpet that is loose or worn.  Avoid having throw rugs at the top or bottom of the stairs. If you do have throw rugs, attach them to the floor with carpet tape.  Make sure that you have a light switch at the top of the stairs and the bottom of the stairs. If you do not have them, ask someone to add them for you. WHAT ELSE CAN I DO TO HELP PREVENT FALLS?  Wear shoes that:  Do not have high heels.  Have rubber bottoms.  Are comfortable and fit you well.  Are closed at the toe. Do not wear sandals.  If you use a stepladder:  Make sure that it is fully opened. Do not climb a closed stepladder.  Make sure that both sides of the stepladder are locked into place.  Ask someone to hold it for you, if possible.  Clearly mark and make sure that you can see:  Any grab bars or handrails.  First and last steps.  Where the edge of each step is.  Use tools that help you move around (mobility aids) if they are needed. These include:  Canes.  Walkers.  Scooters.  Crutches.  Turn on the lights when you go into a dark area. Replace any light bulbs as soon as they burn out.  Set up  your furniture so you have a clear path. Avoid moving your furniture around.  If any of your floors are uneven, fix them.  If there are any pets around you, be aware of where they are.  Review your medicines with your doctor. Some medicines can  make you feel dizzy. This can increase your chance of falling. Ask your doctor what other things that you can do to help prevent falls.   This information is not intended to replace advice given to you by your health care provider. Make sure you discuss any questions you have with your health care provider.   Document Released: 09/29/2009 Document Revised: 04/19/2015 Document Reviewed: 01/07/2015 Elsevier Interactive Patient Education 2016 [employer]  Health Maintenance, Female Adopting a healthy lifestyle and getting preventive care can go a long way to promote health and wellness. Talk with your health care provider about what schedule of regular examinations is right for you. This is a good chance for you to check in with your provider about disease prevention and staying healthy. In between checkups, there are plenty of things you can do on your own. Experts have done a lot of research about which lifestyle changes and preventive measures are most likely to keep you healthy. Ask your health care provider for more information. WEIGHT AND DIET  Eat a healthy diet  Be sure to include plenty of vegetables, fruits, low-fat dairy products, and lean protein.  Do not eat a lot of foods high in solid fats, added sugars, or salt.  Get regular exercise. This is one of the most important things you can do for your health.  Most adults should exercise for at least 150 minutes each week. The exercise should increase your heart rate and make you sweat (moderate-intensity exercise).  Most adults should also do strengthening exercises at least twice a week. This is in addition to the moderate-intensity exercise.  Maintain a healthy weight  Body mass  index (BMI) is a measurement that can be used to identify possible weight problems. It estimates body fat based on height and weight. Your health care provider can help determine your BMI and help you achieve or maintain a healthy weight.  For females 20 years of age and older:   A BMI below 18.5 is considered underweight.  A BMI of 18.5 to 24.9 is normal.  A BMI of 25 to 29.9 is considered overweight.  A BMI of 30 and above is considered obese.  Watch levels of cholesterol and blood lipids  You should start having your blood tested for lipids and cholesterol at 53 years of age, then have this test every 5 years.  You may need to have your cholesterol levels checked more often if:  Your lipid or cholesterol levels are high.  You are older than 53 years of age.  You are at high risk for heart disease.  CANCER SCREENING   Lung Cancer  Lung cancer screening is recommended for adults 55-80 years old who are at high risk for lung cancer because of a history of smoking.  A yearly low-dose CT scan of the lungs is recommended for people who:  Currently smoke.  Have quit within the past 15 years.  Have at least a 30-pack-year history of smoking. A pack year is smoking an average of one pack of cigarettes a day for 1 year.  Yearly screening should continue until it has been 15 years since you quit.  Yearly screening should stop if you develop a health problem that would prevent you from having lung cancer treatment.  Breast Cancer  Practice breast self-awareness. This means understanding how your breasts normally appear and feel.  It also means doing regular breast self-exams. Let your health care provider know about any changes, no matter how small.    If you are in your 20s or 30s, you should have a clinical breast exam (CBE) by a health care provider every 1-3 years as part of a regular health exam.  If you are 10 or older, have a CBE every year. Also consider having a  breast X-ray (mammogram) every year.  If you have a family history of breast cancer, talk to your health care provider about genetic screening.  If you are at high risk for breast cancer, talk to your health care provider about having an MRI and a mammogram every year.  Breast cancer gene (BRCA) assessment is recommended for women who have family members with BRCA-related cancers. BRCA-related cancers include:  Breast.  Ovarian.  Tubal.  Peritoneal cancers.  Results of the assessment will determine the need for genetic counseling and BRCA1 and BRCA2 testing. Cervical Cancer Your health care provider may recommend that you be screened regularly for cancer of the pelvic organs (ovaries, uterus, and vagina). This screening involves a pelvic examination, including checking for microscopic changes to the surface of your cervix (Pap test). You may be encouraged to have this screening done every 3 years, beginning at age 41.  For women ages 73-65, health care providers may recommend pelvic exams and Pap testing every 3 years, or they may recommend the Pap and pelvic exam, combined with testing for human papilloma virus (HPV), every 5 years. Some types of HPV increase your risk of cervical cancer. Testing for HPV may also be done on women of any age with unclear Pap test results.  Other health care providers may not recommend any screening for nonpregnant women who are considered low risk for pelvic cancer and who do not have symptoms. Ask your health care provider if a screening pelvic exam is right for you.  If you have had past treatment for cervical cancer or a condition that could lead to cancer, you need Pap tests and screening for cancer for at least 20 years after your treatment. If Pap tests have been discontinued, your risk factors (such as having a new sexual partner) need to be reassessed to determine if screening should resume. Some women have medical problems that increase the chance of  getting cervical cancer. In these cases, your health care provider may recommend more frequent screening and Pap tests. Colorectal Cancer  This type of cancer can be detected and often prevented.  Routine colorectal cancer screening usually begins at 53 years of age and continues through 53 years of age.  Your health care provider may recommend screening at an earlier age if you have risk factors for colon cancer.  Your health care provider may also recommend using home test kits to check for hidden blood in the stool.  A small camera at the end of a tube can be used to examine your colon directly (sigmoidoscopy or colonoscopy). This is done to check for the earliest forms of colorectal cancer.  Routine screening usually begins at age 69.  Direct examination of the colon should be repeated every 5-10 years through 53 years of age. However, you may need to be screened more often if early forms of precancerous polyps or small growths are found. Skin Cancer  Check your skin from head to toe regularly.  Tell your health care provider about any new moles or changes in moles, especially if there is a change in a mole's shape or color.  Also tell your health care provider if you have a mole that is larger than the  size of a pencil eraser.  Always use sunscreen. Apply sunscreen liberally and repeatedly throughout the day.  Protect yourself by wearing long sleeves, pants, a wide-brimmed hat, and sunglasses whenever you are outside. HEART DISEASE, DIABETES, AND HIGH BLOOD PRESSURE   High blood pressure causes heart disease and increases the risk of stroke. High blood pressure is more likely to develop in:  People who have blood pressure in the high end of the normal range (130-139/85-89 mm Hg).  People who are overweight or obese.  People who are African American.  If you are 18-39 years of age, have your blood pressure checked every 3-5 years. If you are 40 years of age or older, have  your blood pressure checked every year. You should have your blood pressure measured twice--once when you are at a hospital or clinic, and once when you are not at a hospital or clinic. Record the average of the two measurements. To check your blood pressure when you are not at a hospital or clinic, you can use:  An automated blood pressure machine at a pharmacy.  A home blood pressure monitor.  If you are between 55 years and 79 years old, ask your health care provider if you should take aspirin to prevent strokes.  Have regular diabetes screenings. This involves taking a blood sample to check your fasting blood sugar level.  If you are at a normal weight and have a low risk for diabetes, have this test once every three years after 53 years of age.  If you are overweight and have a high risk for diabetes, consider being tested at a younger age or more often. PREVENTING INFECTION  Hepatitis B  If you have a higher risk for hepatitis B, you should be screened for this virus. You are considered at high risk for hepatitis B if:  You were born in a country where hepatitis B is common. Ask your health care provider which countries are considered high risk.  Your parents were born in a high-risk country, and you have not been immunized against hepatitis B (hepatitis B vaccine).  You have HIV or AIDS.  You use needles to inject street drugs.  You live with someone who has hepatitis B.  You have had sex with someone who has hepatitis B.  You get hemodialysis treatment.  You take certain medicines for conditions, including cancer, organ transplantation, and autoimmune conditions. Hepatitis C  Blood testing is recommended for:  Everyone born from 1945 through 1965.  Anyone with known risk factors for hepatitis C. Sexually transmitted infections (STIs)  You should be screened for sexually transmitted infections (STIs) including gonorrhea and chlamydia if:  You are sexually active and  are younger than 53 years of age.  You are older than 53 years of age and your health care provider tells you that you are at risk for this type of infection.  Your sexual activity has changed since you were last screened and you are at an increased risk for chlamydia or gonorrhea. Ask your health care provider if you are at risk.  If you do not have HIV, but are at risk, it may be recommended that you take a prescription medicine daily to prevent HIV infection. This is called pre-exposure prophylaxis (PrEP). You are considered at risk if:  You are sexually active and do not regularly use condoms or know the HIV status of your partner(s).  You take drugs by injection.  You are sexually active with a partner who   has HIV. Talk with your health care provider about whether you are at high risk of being infected with HIV. If you choose to begin PrEP, you should first be tested for HIV. You should then be tested every 3 months for as long as you are taking PrEP.  PREGNANCY   If you are premenopausal and you may become pregnant, ask your health care provider about preconception counseling.  If you may become pregnant, take 400 to 800 micrograms (mcg) of folic acid every day.  If you want to prevent pregnancy, talk to your health care provider about birth control (contraception). OSTEOPOROSIS AND MENOPAUSE   Osteoporosis is a disease in which the bones lose minerals and strength with aging. This can result in serious bone fractures. Your risk for osteoporosis can be identified using a bone density scan.  If you are 63 years of age or older, or if you are at risk for osteoporosis and fractures, ask your health care provider if you should be screened.  Ask your health care provider whether you should take a calcium or vitamin D supplement to lower your risk for osteoporosis.  Menopause may have certain physical symptoms and risks.  Hormone replacement therapy may reduce some of these symptoms  and risks. Talk to your health care provider about whether hormone replacement therapy is right for you.  HOME CARE INSTRUCTIONS   Schedule regular health, dental, and eye exams.  Stay current with your immunizations.   Do not use any tobacco products including cigarettes, chewing tobacco, or electronic cigarettes.  If you are pregnant, do not drink alcohol.  If you are breastfeeding, limit how much and how often you drink alcohol.  Limit alcohol intake to no more than 1 drink per day for nonpregnant women. One drink equals 12 ounces of beer, 5 ounces of wine, or 1 ounces of hard liquor.  Do not use street drugs.  Do not share needles.  Ask your health care provider for help if you need support or information about quitting drugs.  Tell your health care provider if you often feel depressed.  Tell your health care provider if you have ever been abused or do not feel safe at home.   This information is not intended to replace advice given to you by your health care provider. Make sure you discuss any questions you have with your health care provider.   Document Released: 06/18/2011 Document Revised: 12/24/2014 Document Reviewed: 11/04/2013 Elsevier Interactive Patient Education [employer].

## 2016-06-14 ENCOUNTER — Encounter: Payer: Self-pay | Admitting: Internal Medicine

## 2016-06-14 ENCOUNTER — Ambulatory Visit (INDEPENDENT_AMBULATORY_CARE_PROVIDER_SITE_OTHER): Payer: Medicare Other | Admitting: Internal Medicine

## 2016-06-14 DIAGNOSIS — R21 Rash and other nonspecific skin eruption: Secondary | ICD-10-CM | POA: Insufficient documentation

## 2016-06-14 DIAGNOSIS — J3089 Other allergic rhinitis: Secondary | ICD-10-CM | POA: Diagnosis not present

## 2016-06-14 DIAGNOSIS — J449 Chronic obstructive pulmonary disease, unspecified: Secondary | ICD-10-CM

## 2016-06-14 DIAGNOSIS — I10 Essential (primary) hypertension: Secondary | ICD-10-CM

## 2016-06-14 MED ORDER — TRIAMCINOLONE ACETONIDE 0.1 % EX CREA: 1.0000 "application " | TOPICAL_CREAM | Freq: Two times a day (BID) | CUTANEOUS | Status: DC

## 2016-06-14 MED ORDER — METHYLPREDNISOLONE ACETATE 40 MG/ML IJ SUSP
40.0000 mg | Freq: Once | INTRAMUSCULAR | Status: AC
  Administered 2016-06-14: 40 mg via INTRAMUSCULAR

## 2016-06-14 NOTE — Progress Notes (Signed)
Pre visit review using our clinic review tool, if applicable. No additional management support is needed unless otherwise documented below in the visit note. 

## 2016-06-14 NOTE — Assessment & Plan Note (Signed)
stable overall by history and exam, recent data reviewed with pt, and pt to continue medical treatment as before,  to f/u any worsening symptoms or concerns BP Readings from Last 3 Encounters:  06/14/16 128/72  06/04/16 130/80  05/25/16 118/86

## 2016-06-14 NOTE — Assessment & Plan Note (Signed)
stable overall by history and exam, recent data reviewed with pt, and pt to continue medical treatment as before,  to f/u any worsening symptoms or concerns SpO2 Readings from Last 3 Encounters:  06/14/16 98%  06/04/16 97%  05/25/16 99%

## 2016-06-14 NOTE — Patient Instructions (Addendum)
You had the steroid shot today  Please take all new medication as prescribed - the steroid cream  Please continue all other medications as before, and refills have  been done if requested.  Please have the pharmacy call with any other refills you may need.  Please continue your efforts at being more active, low cholesterol diet, and weight control.  Please keep your appointments with your specialists as you may have planned  Please return in 6 months, or sooner if needed

## 2016-06-14 NOTE — Assessment & Plan Note (Signed)
Mild to mod, c/w eczematous type, for triam cr prn,  to f/u any worsening symptoms or concerns

## 2016-06-14 NOTE — Assessment & Plan Note (Signed)
/  Mild to mod, for depomedrol IM for flare,  to f/u any worsening symptoms or concerns

## 2016-06-14 NOTE — Progress Notes (Signed)
Subjective:    Patient ID: [name], female    DOB: 10-Nov-1961, 55 y.o.   MRN: [id number]  HPI  .Here to f/u; overall doing ok,  Pt denies chest pain, increasing sob or doe, wheezing, orthopnea, PND, increased LE swelling, palpitations, dizziness or syncope.  Pt denies new neurological symptoms such as new headache, or facial or extremity weakness or numbness.  Pt denies polydipsia, polyuria, or low sugar episode.   Pt denies new neurological symptoms such as new headache, or facial or extremity weakness or numbness.   Pt states overall good compliance with meds, mostly trying to follow appropriate diet, with wt overall stable Wt Readings from Last 3 Encounters:  06/14/16 165 lb (74.844 kg)  06/04/16 165 lb 12 oz (75.184 kg)  05/25/16 164 lb 3.2 oz (74.481 kg)  Does have several wks ongoing nasal allergy symptoms with clearish congestion, itch and sneezing, without fever, pain, ST, cough, swelling or wheezing.  Also with 2 wks onset marked itching and rash to area of left postlat neck, just cant stop scratching, also with itching all over but no other rash.   Pt denies fever, wt loss, night sweats, loss of appetite, or other constitutional symptoms  Overall good compliance with treatment, and good medicine tolerability, except has some AM fatigue after taking seroquel 100 qhs, but plans to d/w psychiatry about this. Past Medical History  Diagnosis Date  . Asthma   . Anxiety   . COPD (chronic obstructive pulmonary disease) ([place])   . Angina   . Depression   . Hypertension   . GERD (gastroesophageal reflux disease)   . Arthritis   . Peripheral vascular disease ([place])   . Migraine   . COPD 08/24/2009    Qualifier: Diagnosis of  By: Burnett Kanaris    . Prediabetes 02/23/2014  . HYPERTENSION 05/12/2009    Qualifier: Diagnosis of  By: Lenn Cal Deborra Medina), Wynona Canes    . ASTHMA 05/12/2009    Severe AFL (Spirometry 05/2009: pre-BD FEV1 0.87L 34% pred, post-BD FEV1 1.11L 44% pred) Volumes  hyperinflated Decreased DLCO that does not fully correct to normal range for alveolar volume.     . ALLERGIC RHINITIS 05/12/2009    Qualifier: Diagnosis of  By: Lenn Cal Deborra Medina), Wynona Canes    . Fibromyalgia 05/14/2014  . Anemia, iron deficiency 12/22/2014  . Urticaria    Past Surgical History  Procedure Laterality Date  . Neck surgery    . Toe surgery    . Abdominal hysterectomy    . Tubal ligation    . Kidney stone surgery    . Hemorrhoid surgery    . Incise and drain abcess    . Back surgery    . Diagnostic laparoscopy    . Colonoscopy  12/20/2011    Procedure: COLONOSCOPY;  Surgeon: Landry Dyke, MD;  Location: [facility];  Service: Endoscopy;  Laterality: N/A;  . Colonoscopy  03/05/2012    Procedure: COLONOSCOPY;  Surgeon: Landry Dyke, MD;  Location: [facility];  Service: Endoscopy;  Laterality: N/A;    reports that she has been smoking Cigarettes.  She has a 18 pack-year smoking history. She has never used smokeless tobacco. She reports that she uses illicit drugs (Marijuana). She reports that she does not drink alcohol. family history includes Alcohol abuse in her brother and father; Anesthesia problems in her father; Asthma in her brother, mother, and sister; COPD in her mother; Cancer in her father; Diabetes in her brother, father, mother, sister, and sister; Heart  disease in her brother, mother, and sister; Hypertension in her sister; Kidney disease in her father; Sleep apnea in her brother. There is no history of Allergic rhinitis, Eczema, or Urticaria. Allergies  Allergen Reactions  . Sulfa Antibiotics Nausea And Vomiting  . Sulfonamide Derivatives Nausea And Vomiting    REACTION: n/v   Current Outpatient Prescriptions on File Prior to Visit  Medication Sig Dispense Refill  . albuterol (PROVENTIL) (2.5 MG/3ML) 0.083% nebulizer solution Take 2.5 mg by nebulization every 4 (four) hours as needed for wheezing or shortness of breath. Reported on 06/04/2016    . albuterol  (VENTOLIN HFA) 108 (90 Base) MCG/ACT inhaler One to two puffs every 4 to 6 hours as needed 1 Inhaler 5  . aspirin 325 MG tablet Take 325 mg by mouth daily.    Marland Kitchen azelastine (ASTELIN) 0.1 % nasal spray USE 1-2 SPRAYS IN EACH NOSTRIL TWICE DAILY AS DIRECTED (Patient taking differently: Place 2 sprays into both nostrils 3 (three) times daily. USE 1-2 SPRAYS IN EACH NOSTRIL TWICE DAILY AS DIRECTED) 30 mL 5  . budesonide-formoterol (SYMBICORT) 160-4.5 MCG/ACT inhaler Inhale 2 puffs into the lungs 2 (two) times daily. 1 Inhaler 5  . cholecalciferol (VITAMIN D) 1000 units tablet Take 1,000 Units by mouth daily.    . clotrimazole (MYCELEX) 10 MG troche Slowly dissolve in the mouth 5 times daily for 10 days 50 tablet 0  . cyclobenzaprine (FLEXERIL) 5 MG tablet Take 1 tablet (5 mg total) by mouth 3 (three) times daily as needed for muscle spasms. 30 tablet 0  . diphenhydrAMINE (BENADRYL) 25 MG tablet Take 25 mg by mouth every 6 (six) hours as needed.    . fluticasone (FLONASE) 50 MCG/ACT nasal spray Place 2 sprays into both nostrils 2 (two) times daily. 16 g 2  . gabapentin (NEURONTIN) 100 MG capsule TAKE 2 CAPSULES BY MOUTH AT BEDTIME 180 capsule 1  . hydrOXYzine (VISTARIL) 25 MG capsule TAKE ONE CAPSULE BY MOUTH THREE TIMES DAILY AS NEEDED (Patient taking differently: TWO CAPSULES NIGHTLY) 60 capsule 0  . irbesartan-hydrochlorothiazide (AVALIDE) 150-12.5 MG tablet TAKE 1 TABLET BY MOUTH EVERY DAY 90 tablet 1  . meloxicam (MOBIC) 15 MG tablet Take 1 tablet (15 mg total) by mouth daily. 90 tablet 3  . mometasone-formoterol (DULERA) 200-5 MCG/ACT AERO INHALE 2 PUFFS INTO THE LUNGS TWICE DAILY 1 Inhaler 5  . montelukast (SINGULAIR) 10 MG tablet Take 1 tablet (10 mg total) by mouth at bedtime. 90 tablet 3  . NONFORMULARY OR COMPOUNDED ITEM Estradiol 0.02 % 31ml prefilled applicator Sig: apply twice a week 24 each 4  . pantoprazole (PROTONIX) 40 MG tablet Take 1 tablet (40 mg total) by mouth daily. 30 tablet 5  .  QUEtiapine (SEROQUEL) 100 MG tablet Take 1 tablet (100 mg total) by mouth at bedtime. 30 tablet 1  . tiotropium (SPIRIVA HANDIHALER) 18 MCG inhalation capsule Place 1 capsule (18 mcg total) into inhaler and inhale daily. 30 capsule 5  . tiZANidine (ZANAFLEX) 4 MG tablet Take 1 tablet (4 mg total) by mouth every 6 (six) hours as needed for muscle spasms. 60 tablet 2  . traMADol (ULTRAM) 50 MG tablet Take 1 tablet (50 mg total) by mouth every 6 (six) hours as needed. For pain. 30 tablet 3   Current Facility-Administered Medications on File Prior to Visit  Medication Dose Route Frequency Provider Last Rate Last Dose  . ipratropium-albuterol (DUONEB) 0.5-2.5 (3) MG/3ML nebulizer solution 3 mL  3 mL Nebulization Once [employer],  MD      . predniSONE (DELTASONE) tablet 10 mg  10 mg Oral UD Adelina Mings, MD       Review of Systems  Constitutional: Negative for unusual diaphoresis or night sweats HENT: Negative for ear swelling or discharge Eyes: Negative for worsening visual haziness  Respiratory: Negative for choking and stridor.   Gastrointestinal: Negative for distension or worsening eructation Genitourinary: Negative for retention or change in urine volume.  Musculoskeletal: Negative for other MSK pain or swelling Skin: Negative for color change and worsening wound Neurological: Negative for tremors and numbness other than noted  Psychiatric/Behavioral: Negative for decreased concentration or agitation other than above       Objective:   Physical Exam BP 128/72 mmHg  Pulse 74  Temp(Src) 98.1 F (36.7 C) (Oral)  Resp 20  Wt 165 lb (74.844 kg)  SpO2 98% VS noted,  Constitutional: Pt appears in no apparent distress HENT: Head: NCAT.  Right Ear: External ear normal.  Left Ear: External ear normal.  Bilat tm's with mild erythema.  Max sinus areas non tender.  Pharynx with mild erythema, no exudate Eyes: . Pupils are equal, round, and reactive to light. Conjunctivae and  EOM are normal Neck: Normal range of motion. Neck supple.  Cardiovascular: Normal rate and regular rhythm.   Pulmonary/Chest: Effort normal and breath sounds without rales or wheezing.  Neurological: Pt is alert. Not confused , motor grossly intact Skin: Skin is warm. + eczematous rash large area 4 cm to left post lat neck, no LE edema Psychiatric: Pt behavior is normal. No agitation.     Assessment & Plan:

## 2016-06-14 NOTE — Addendum Note (Signed)
Addended by: Della Goo C on: 06/14/2016 05:17 PM   Modules accepted: Orders

## 2016-06-21 ENCOUNTER — Telehealth: Payer: Self-pay | Admitting: *Deleted

## 2016-06-21 MED ORDER — PREDNISONE 10 MG PO TABS: ORAL_TABLET | ORAL | Status: DC

## 2016-06-21 NOTE — Telephone Encounter (Signed)
Prednisone done erx

## 2016-06-21 NOTE — Telephone Encounter (Signed)
Left msg on triage stating saw md last week was given shot to help itching sxs. Pt states she is still  itching all over she can not take this rash has not cleared up. Requesting a prednisone pck to be call to pharmacy...Johny Chess

## 2016-07-09 ENCOUNTER — Ambulatory Visit (HOSPITAL_COMMUNITY): Payer: Self-pay | Admitting: Clinical

## 2016-07-09 ENCOUNTER — Ambulatory Visit (HOSPITAL_COMMUNITY): Payer: Self-pay | Admitting: Psychiatry

## 2016-07-12 ENCOUNTER — Encounter: Payer: Self-pay | Admitting: Emergency Medicine

## 2016-07-12 ENCOUNTER — Ambulatory Visit (INDEPENDENT_AMBULATORY_CARE_PROVIDER_SITE_OTHER): Payer: Medicare Other | Admitting: Emergency Medicine

## 2016-07-12 DIAGNOSIS — R51 Headache: Secondary | ICD-10-CM | POA: Diagnosis not present

## 2016-07-12 DIAGNOSIS — J449 Chronic obstructive pulmonary disease, unspecified: Secondary | ICD-10-CM

## 2016-07-12 DIAGNOSIS — R519 Headache, unspecified: Secondary | ICD-10-CM | POA: Insufficient documentation

## 2016-07-12 DIAGNOSIS — J3089 Other allergic rhinitis: Secondary | ICD-10-CM | POA: Diagnosis not present

## 2016-07-12 NOTE — Assessment & Plan Note (Signed)
Unclear whether this is sinus fullness versus dental. I have asked her to see her tenderness. Try to treat her allergic rhinitis to see if I can clear her sinus pressure. No evidence of an acute infection.

## 2016-07-12 NOTE — Progress Notes (Signed)
Subjective:    Patient ID: [name], female    DOB: [birth year], 55 y.o.   MRN: [id number]  HPI    Expand All Collapse All   55  yo former smoker with hx HTN, migraine HA, asthma dx in 2000 after a hospitalization for dyspnea. No asthma as a child. Had been managed on Advair 250 (ran out), now on Dulera + SABA since hosp. She was hospitalized 1/7 - 1/13 for an acute exacerbation. She has been doing fairly well, is having some stabbing pains with exertion (prior cards eval was reassuring). She had gone back to smoking since last Summer, just quit again when hospitalized. She is weaning pred now. Uses albuterol every day at least once. She is planning to restart exercise routine. She is taking benadryl for itching. She is having a lot of nasal gtt and UA irritation.    Acute OV 05/28/13 -- she has been having more problems with congestion, more problems with wheeze, cough, dyspnea. She was on Dulera, nasonex - hasn't been able to take in months because she lost her job.   ROV 08/07/13 -- f/u COPD, rhinitis, cough. Was seen since last visit by Dr Melvyn Novas with wheeze, sputum, tightness. Was not taking her BD's - couldn;t afford. Was treated for sinusitis with augmentin and pred. Also changed to bystolic. 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 Using dulera bid, albuterol 2-3 x a day.   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 She had stopped smoking for 2 months, has started back now about 1/2 pk a day. She is on [place] - feels that she benefited from the addition of spiriva. She was just treated with prednisone for an AE with good resolution > she believes it was house full of smoke that triggered her. Uses albuterol every day. Minimal cough or wheeze.   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 She has been doing fairly well, although she just moved to an apartment with 3  flights of stairs. She quit smoking June 2. She is having some insomnia and has been eating a lot. Her cough has been stable. She was just treated for otitis media. She restarted flonase.   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 I treated her for an AE 6/26 (azithro + pred) > her wheeze improved but she is dealing with fatigue, dizziness, weakness when walking. She has seen some vaginal bleeding x 1 (even though has had a hysterectomy). She has noted bruising on her arms and her thighs since this last weekend.   ROV 10/18/14 -- follow up for severe AFL, COPD with a recent AE treated by TP. Returns for f/u. She is improved but states that her energy is low. She is still smoking a little - every other day. She is on spiriva and dulera. She uses albuterol every day.       [name] presents to clinic today complaining of 2 weeks of increasing chest tightness, shortness of breath and cough productive of green mucus. She has had some minor sinus symptoms during this time. She has been taking Mucinex over-the-counter which is not helped with her symptoms. She continues to smoke cigarettes at about 5 cigarettes per day. She stopped taking her inhaled therapies about 2 months ago for unclear reasons. She says that she has started to have some increasing shortness  of breath in the evenings which is unusual for her. She has been using her albuterol frequently with this has not helped. She denies fever but she does note chills. She has not had night sweats. She has had a sick contact with her grandchild who was sick last week.  ROV 04/12/15 -- follow-up visit for severe obstructive lung disease, tobacco use, dyspnea. She was seen by Dr Lake Bells a month ago for apparent acute exacerbation. She was treated with prednisone taper and Ceftin. She remains on [place]. She is still finishing pred, has finished the abx. She hasn't smoked for 7 days. Not taking loratadine.    ROV 07/12/16 --  Patient follows up for history of tobacco use, severe obstructive lung disease with frequent exacerbations. She reports today that she is having R ear fullness, intact hearing, R facial pain, R tooth pain. Has been happening since Monday. No nasal gtt or discharge. No fevers. She is on astelin NS, flonase NS prn. Using Symbicort, singulair, Spiriva. Albuterol nebs. She has been seen several times since her last visit with me by other providers, treated for possible exacerbations. She takes benadryl prn.    Past Medical History:  Diagnosis Date  . ALLERGIC RHINITIS 05/12/2009   Qualifier: Diagnosis of  By: Lenn Cal Deborra Medina), Wynona Canes    . Anemia, iron deficiency 12/22/2014  . Angina   . Anxiety   . Arthritis   . Asthma   . ASTHMA 05/12/2009   Severe AFL (Spirometry 05/2009: pre-BD FEV1 0.87L 34% pred, post-BD FEV1 1.11L 44% pred) Volumes hyperinflated Decreased DLCO that does not fully correct to normal range for alveolar volume.     Marland Kitchen COPD 08/24/2009   Qualifier: Diagnosis of  By: Burnett Kanaris    . COPD (chronic obstructive pulmonary disease) ([place])   . Depression   . Fibromyalgia 05/14/2014  . GERD (gastroesophageal reflux disease)   . Hypertension   . HYPERTENSION 05/12/2009   Qualifier: Diagnosis of  By: Lenn Cal Deborra Medina), Wynona Canes    . Migraine   . Peripheral vascular disease ([place])   . Prediabetes 02/23/2014  . Urticaria       Review of Systems  Constitutional: Positive for fatigue. Negative for chills and fever.  HENT: Negative for postnasal drip, rhinorrhea and sinus pressure.   Respiratory: Positive for cough, shortness of breath and wheezing.   Cardiovascular: Positive for chest pain. Negative for palpitations and leg swelling.       Objective:   Physical Exam Vitals:   07/12/16 1128  BP: 110/88  Pulse: 76  SpO2: 96%  Weight: 164 lb (74.4 kg)  Height: 5\' 3"  (1.6 m)  Gen: Pleasant, well-nourished, in no distress,  normal affect  ENT: No lesions,  mouth clear,  oropharynx  clear, no postnasal drip  Neck: No JVD, no TMG, no carotid bruits  Lungs: No use of accessory muscles, distant, clear without rales or rhonchi  Cardiovascular: RRR, heart sounds normal, no murmur or gallops, no peripheral edema  Abdomen: soft and NT, no HSM,  BS normal  Musculoskeletal: No deformities, no cyanosis or clubbing  Neuro: alert, non focal  Skin: Warm, no lesions or rashes          Assessment & Plan:   COPD (chronic obstructive pulmonary disease) Appears to be stable at this time. She needs refills on her symbicort and ventolin. She does exacerbate frequently. I discussed smoking cessation w her.   Allergic rhinitis She is having right ear fullness and pressure, some facial pain without  any other evidence of an acute sinusitis. I like to restart her scheduled nasal steroid. Continue Benadryl. No indication to treat with antibiotic at this time  Facial pain Unclear whether this is sinus fullness versus dental. I have asked her to see her tenderness. Try to treat her allergic rhinitis to see if I can clear her sinus pressure. No evidence of an acute infection.  Baltazar Apo, MD, PhD 07/12/2016, 12:14 PM [facility] [phone] or if no answer [phone]   Updated Medication List Outpatient Encounter Prescriptions as of 07/12/2016  Medication Sig  . albuterol (PROVENTIL) (2.5 MG/3ML) 0.083% nebulizer solution Take 2.5 mg by nebulization every 4 (four) hours as needed for wheezing or shortness of breath. Reported on 06/04/2016  . albuterol (VENTOLIN HFA) 108 (90 Base) MCG/ACT inhaler One to two puffs every 4 to 6 hours as needed  . aspirin 325 MG tablet Take 325 mg by mouth daily.  Marland Kitchen azelastine (ASTELIN) 0.1 % nasal spray USE 1-2 SPRAYS IN EACH NOSTRIL TWICE DAILY AS DIRECTED (Patient taking differently: Place 2 sprays into both nostrils 3 (three) times daily. USE 1-2 SPRAYS IN EACH NOSTRIL TWICE DAILY AS DIRECTED)  . budesonide-formoterol  (SYMBICORT) 160-4.5 MCG/ACT inhaler Inhale 2 puffs into the lungs 2 (two) times daily.  . cholecalciferol (VITAMIN D) 1000 units tablet Take 1,000 Units by mouth daily.  . clotrimazole (MYCELEX) 10 MG troche Slowly dissolve in the mouth 5 times daily for 10 days  . cyclobenzaprine (FLEXERIL) 5 MG tablet Take 1 tablet (5 mg total) by mouth 3 (three) times daily as needed for muscle spasms.  . diphenhydrAMINE (BENADRYL) 25 MG tablet Take 25 mg by mouth every 6 (six) hours as needed.  . fluticasone (FLONASE) 50 MCG/ACT nasal spray Place 2 sprays into both nostrils 2 (two) times daily.  Marland Kitchen gabapentin (NEURONTIN) 100 MG capsule TAKE 2 CAPSULES BY MOUTH AT BEDTIME  . hydrOXYzine (VISTARIL) 25 MG capsule TAKE ONE CAPSULE BY MOUTH THREE TIMES DAILY AS NEEDED (Patient taking differently: TWO CAPSULES NIGHTLY)  . irbesartan-hydrochlorothiazide (AVALIDE) 150-12.5 MG tablet TAKE 1 TABLET BY MOUTH EVERY DAY  . meloxicam (MOBIC) 15 MG tablet Take 1 tablet (15 mg total) by mouth daily.  . montelukast (SINGULAIR) 10 MG tablet Take 1 tablet (10 mg total) by mouth at bedtime.  . NONFORMULARY OR COMPOUNDED ITEM Estradiol 0.02 % 41ml prefilled applicator Sig: apply twice a week  . pantoprazole (PROTONIX) 40 MG tablet Take 1 tablet (40 mg total) by mouth daily.  . QUEtiapine (SEROQUEL) 100 MG tablet Take 1 tablet (100 mg total) by mouth at bedtime.  Marland Kitchen tiotropium (SPIRIVA HANDIHALER) 18 MCG inhalation capsule Place 1 capsule (18 mcg total) into inhaler and inhale daily.  Marland Kitchen tiZANidine (ZANAFLEX) 4 MG tablet Take 1 tablet (4 mg total) by mouth every 6 (six) hours as needed for muscle spasms.  . traMADol (ULTRAM) 50 MG tablet Take 1 tablet (50 mg total) by mouth every 6 (six) hours as needed. For pain.  Marland Kitchen triamcinolone cream (KENALOG) 0.1 % Apply 1 application topically 2 (two) times daily.  . [DISCONTINUED] mometasone-formoterol (DULERA) 200-5 MCG/ACT AERO INHALE 2 PUFFS INTO THE LUNGS TWICE DAILY  . [DISCONTINUED]  predniSONE (DELTASONE) 10 MG tablet 3 tabs by mouth per day for 3 days,2tabs per day for 3 days,1tab per day for 3 days   Facility-Administered Encounter Medications as of 07/12/2016  Medication  . ipratropium-albuterol (DUONEB) 0.5-2.5 (3) MG/3ML nebulizer solution 3 mL  . predniSONE (DELTASONE) tablet 10 mg

## 2016-07-12 NOTE — Assessment & Plan Note (Signed)
She is having right ear fullness and pressure, some facial pain without any other evidence of an acute sinusitis. I like to restart her scheduled nasal steroid. Continue Benadryl. No indication to treat with antibiotic at this time

## 2016-07-12 NOTE — Assessment & Plan Note (Signed)
Appears to be stable at this time. She needs refills on her symbicort and ventolin. She does exacerbate frequently. I discussed smoking cessation w her.

## 2016-07-12 NOTE — Patient Instructions (Signed)
Please start using your flonase nasal spray 2 sprays each side every day Continue to use astelin nasal spray as needed for congestion and sinus pressure.  Continue your same inhaled medications as you are taking them  Go to the dentist for a check up as planned.  Work on decreasing your cigarettes. It will be important for your health to stop completely.  Follow with Dr Lamonte Sakai in 3 months or sooner if you have any problems.

## 2016-07-16 ENCOUNTER — Telehealth: Payer: Self-pay | Admitting: Internal Medicine

## 2016-07-16 ENCOUNTER — Ambulatory Visit (HOSPITAL_COMMUNITY): Payer: Self-pay | Admitting: Psychiatry

## 2016-07-16 NOTE — Telephone Encounter (Signed)
Spoke to patient answered questions

## 2016-07-16 NOTE — Telephone Encounter (Signed)
Pt request to speak to the assistant concern about FMLA that we filled out for her back in 05/2016. We did not answer certain question: 1,2 & 4. Please check and call her back

## 2016-07-19 ENCOUNTER — Telehealth: Payer: Self-pay | Admitting: Emergency Medicine

## 2016-07-19 MED ORDER — ALBUTEROL SULFATE HFA 108 (90 BASE) MCG/ACT IN AERS: INHALATION_SPRAY | RESPIRATORY_TRACT | 5 refills | Status: DC

## 2016-07-19 MED ORDER — BUDESONIDE-FORMOTEROL FUMARATE 160-4.5 MCG/ACT IN AERO: 2.0000 | INHALATION_SPRAY | Freq: Two times a day (BID) | RESPIRATORY_TRACT | 5 refills | Status: DC

## 2016-07-19 NOTE — Telephone Encounter (Signed)
Called and spoke with pt and she is aware of refills of inhalers that have been sent to her pharmacy.    Pt wanted to let RB know that she has been taking the benadryl and this is not helping.  She stated that she feels that she needs prednisone to help with this.  RB please advise. Thanks  Allergies  Allergen Reactions  . Sulfa Antibiotics Nausea And Vomiting  . Sulfonamide Derivatives Nausea And Vomiting    REACTION: n/v

## 2016-07-19 NOTE — Telephone Encounter (Signed)
Ok to cal a very brief course prednisone to help w itching. If no relief on this course then she will need to see either her PCP or [place]  Prednisone 20mg  po daily x 3 days, no RF

## 2016-07-20 MED ORDER — PREDNISONE 20 MG PO TABS: 20.0000 mg | ORAL_TABLET | Freq: Every day | ORAL | 0 refills | Status: DC

## 2016-07-20 NOTE — Telephone Encounter (Signed)
Called and spoke with pt and she is aware or RB recs.  pred has been sent to the pharmacy and pt is aware. Nothing further is needed.

## 2016-07-25 ENCOUNTER — Other Ambulatory Visit (HOSPITAL_COMMUNITY): Payer: Self-pay

## 2016-07-25 ENCOUNTER — Ambulatory Visit (INDEPENDENT_AMBULATORY_CARE_PROVIDER_SITE_OTHER): Payer: Medicare Other | Admitting: Clinical

## 2016-07-25 DIAGNOSIS — F3132 Bipolar disorder, current episode depressed, moderate: Secondary | ICD-10-CM

## 2016-07-25 DIAGNOSIS — F250 Schizoaffective disorder, bipolar type: Secondary | ICD-10-CM

## 2016-07-25 DIAGNOSIS — F4310 Post-traumatic stress disorder, unspecified: Secondary | ICD-10-CM | POA: Diagnosis not present

## 2016-07-25 MED ORDER — QUETIAPINE FUMARATE 100 MG PO TABS: 100.0000 mg | ORAL_TABLET | Freq: Every day | ORAL | 1 refills | Status: DC

## 2016-07-25 NOTE — Progress Notes (Signed)
   THERAPIST PROGRESS NOTE  Session Time: 1:30 -2:28  Participation Level: Active  Behavioral Response: CasualAlertDepressed   Type of Therapy: Individual Therapy  Treatment Goals addressed: improve psychiatric symptoms, more balanced mood (anger  management), improve unhelpful thought patterns,  controlled behavior, moderated mood, ,  learn about diagnosis, healthy coping skills  Interventions: CBT and Motivational Interviewing psychoeducation, grounding and mindfulness techniques  Summary: [name] is a 55 y.o. female who presents with Schizoaffective disorder, bipolar type, post-traumatic stress disorder  Suicidal/Homicidal: No without intent/plan  Therapist Response: [name] met with clinician for an individual session. [name] shared about her psychiatric symptoms, her current life events and her goals for therapy. [name] shared that she has been anxious and depressed,. [name] shared that she is concerned that her medication might need to be adjusted because it makes her sleepy and then  angry and agitated in the day. Clinician encouraged her to speak with her psychiatrist about her symptoms and concerns. Clinician introduced a grounding technique. Client and clinician practiced the technique together. Clinician explained the process, purpose and practice of the techniques. Clinician shared she could use the technique to interrupt negative or angry thoughts.   Clinician introduced some basic cbt concepts. Client and clinician discussed the thought emotion connection. Clinician gave [name] a homework packet 1 & 2 Bipolar. [name] agreed to complete the -packet and bring it with her to next session  Plan: Return again in 1-2 weeks.  Diagnosis: Axis I: Schizoaffective disorder, bipolar type, post-traumatic stress disorder    Norm Wray A, LCSW 07/25/2016

## 2016-07-28 ENCOUNTER — Emergency Department (HOSPITAL_COMMUNITY)
Admission: EM | Admit: 2016-07-28 | Discharge: 2016-07-28 | Disposition: A | Discharge status: Home / Self Care | Payer: Medicare Other | Attending: Emergency Medicine | Admitting: Emergency Medicine

## 2016-07-28 ENCOUNTER — Encounter (HOSPITAL_COMMUNITY): Payer: Self-pay

## 2016-07-28 ENCOUNTER — Emergency Department (HOSPITAL_COMMUNITY): Payer: Medicare Other

## 2016-07-28 DIAGNOSIS — Z79899 Other long term (current) drug therapy: Secondary | ICD-10-CM | POA: Diagnosis not present

## 2016-07-28 DIAGNOSIS — R1110 Vomiting, unspecified: Secondary | ICD-10-CM | POA: Diagnosis not present

## 2016-07-28 DIAGNOSIS — I10 Essential (primary) hypertension: Secondary | ICD-10-CM | POA: Diagnosis not present

## 2016-07-28 DIAGNOSIS — J449 Chronic obstructive pulmonary disease, unspecified: Secondary | ICD-10-CM | POA: Insufficient documentation

## 2016-07-28 DIAGNOSIS — R519 Headache, unspecified: Secondary | ICD-10-CM

## 2016-07-28 DIAGNOSIS — R51 Headache: Secondary | ICD-10-CM | POA: Insufficient documentation

## 2016-07-28 DIAGNOSIS — F17210 Nicotine dependence, cigarettes, uncomplicated: Secondary | ICD-10-CM | POA: Diagnosis not present

## 2016-07-28 LAB — COMPREHENSIVE METABOLIC PANEL
ALT: 16 U/L (ref 14–54)
AST: 18 U/L (ref 15–41)
Albumin: 4.1 g/dL (ref 3.5–5.0)
Alkaline Phosphatase: 79 U/L (ref 38–126)
Anion gap: 10 (ref 5–15)
BUN: 6 mg/dL (ref 6–20)
CO2: 30 mmol/L (ref 22–32)
Calcium: 9.9 mg/dL (ref 8.9–10.3)
Chloride: 99 mmol/L — ABNORMAL LOW (ref 101–111)
Creatinine, Ser: 0.76 mg/dL (ref 0.44–1.00)
GFR calc Af Amer: >60 mL/min (ref >60)
GFR calc non Af Amer: >60 mL/min (ref >60)
Glucose, Bld: 94 mg/dL (ref 65–99)
Potassium: 4.5 mmol/L (ref 3.5–5.1)
Sodium: 139 mmol/L (ref 135–145)
Total Bilirubin: 0.4 mg/dL (ref 0.3–1.2)
Total Protein: 6.9 g/dL (ref 6.5–8.1)

## 2016-07-28 LAB — CBC WITH DIFFERENTIAL/PLATELET
Basophils Absolute: 0 10*3/uL (ref 0.0–0.1)
Basophils Relative: 0 %
Eosinophils Absolute: 0.2 10*3/uL (ref 0.0–0.7)
Eosinophils Relative: 2 %
HCT: 41.8 % (ref 36.0–46.0)
Hemoglobin: 13.9 g/dL (ref 12.0–15.0)
Lymphocytes Relative: 35 %
Lymphs Abs: 3.5 10*3/uL (ref 0.7–4.0)
MCH: 30.5 pg (ref 26.0–34.0)
MCHC: 33.3 g/dL (ref 30.0–36.0)
MCV: 91.9 fL (ref 78.0–100.0)
Monocytes Absolute: 0.7 10*3/uL (ref 0.1–1.0)
Monocytes Relative: 7 %
Neutro Abs: 5.7 10*3/uL (ref 1.7–7.7)
Neutrophils Relative %: 56 %
Platelets: 342 10*3/uL (ref 150–400)
RBC: 4.55 MIL/uL (ref 3.87–5.11)
RDW: 13.1 % (ref 11.5–15.5)
WBC: 10.1 10*3/uL (ref 4.0–10.5)

## 2016-07-28 LAB — SALICYLATE LEVEL: Salicylate Lvl: 5 mg/dL (ref 2.8–30.0)

## 2016-07-28 LAB — ACETAMINOPHEN LEVEL: Acetaminophen (Tylenol), Serum: <10 ug/mL — ABNORMAL LOW (ref 10–30)

## 2016-07-28 MED ORDER — DEXAMETHASONE SODIUM PHOSPHATE 10 MG/ML IJ SOLN
10.0000 mg | Freq: Once | INTRAMUSCULAR | Status: AC
  Administered 2016-07-28: 10 mg via INTRAVENOUS
  Filled 2016-07-28: qty 1

## 2016-07-28 MED ORDER — SODIUM CHLORIDE 0.9 % IV BOLUS (SEPSIS)
1000.0000 mL | Freq: Once | INTRAVENOUS | Status: AC
  Administered 2016-07-28: 1000 mL via INTRAVENOUS

## 2016-07-28 MED ORDER — METOCLOPRAMIDE HCL 5 MG/ML IJ SOLN
10.0000 mg | Freq: Once | INTRAMUSCULAR | Status: AC
  Administered 2016-07-28: 10 mg via INTRAVENOUS
  Filled 2016-07-28: qty 2

## 2016-07-28 MED ORDER — DIPHENHYDRAMINE HCL 50 MG/ML IJ SOLN
25.0000 mg | Freq: Once | INTRAMUSCULAR | Status: AC
  Administered 2016-07-28: 25 mg via INTRAVENOUS
  Filled 2016-07-28: qty 1

## 2016-07-28 NOTE — ED Notes (Signed)
Onset 07-25-16 headache on right side of head and behind right eye, N/V and blurred right blurred vision.    Vomited on admission to ED.  A&Ox4.  Lungs diminished, exp wheezes left upper lungs.

## 2016-07-28 NOTE — ED Triage Notes (Signed)
Patient here with right sided "migraine" since Wednesday. States that she has taken otc meds with no relief. States intermittent nausea and vomiting with same, denies trauma. Alert and oriented.

## 2016-07-28 NOTE — ED Provider Notes (Signed)
[facility] DEPT Provider Note   CSN: [id number] Arrival date & time: 07/28/16  1341  First Provider Contact:  First MD Initiated Contact with Patient 07/28/16 1442        History   Chief Complaint Chief Complaint  Patient presents with  . Headache  . Emesis    HPI [name] is a 55 y.o. female.  The history is provided by the patient. No language interpreter was used.  Headache   Associated symptoms include vomiting.  Emesis   Associated symptoms include headaches.   [name] is a 55 y.o. female who presents to the Emergency Department complaining of headache.  She reports 3 days of right-sided headache that radiates to her right arm. The pain is described as sharp and constant in nature. It feels like it's predominantly located around the eye and right face, posterior neck and entire right arm. She has photophobia in the right eye. She has tried multiple Goody powders, Tylenol home without any improvement in her symptoms. She states that her headache came on after increased stress but not during exertion. She has a history of prior similar headaches but it is been a year since her last headache. She did vomit this morning and she attributed to taking too much medication. No reports of fevers, numbness, weakness.  Past Medical History:  Diagnosis Date  . ALLERGIC RHINITIS 05/12/2009   Qualifier: Diagnosis of  By: Lenn Cal Deborra Medina), Wynona Canes    . Anemia, iron deficiency 12/22/2014  . Angina   . Anxiety   . Arthritis   . Asthma   . ASTHMA 05/12/2009   Severe AFL (Spirometry 05/2009: pre-BD FEV1 0.87L 34% pred, post-BD FEV1 1.11L 44% pred) Volumes hyperinflated Decreased DLCO that does not fully correct to normal range for alveolar volume.     Marland Kitchen COPD 08/24/2009   Qualifier: Diagnosis of  By: Burnett Kanaris    . COPD (chronic obstructive pulmonary disease) ([place])   . Depression   . Fibromyalgia 05/14/2014  . GERD (gastroesophageal reflux disease)   .  Hypertension   . HYPERTENSION 05/12/2009   Qualifier: Diagnosis of  By: Lenn Cal Deborra Medina), Wynona Canes    . Migraine   . Peripheral vascular disease ([place])   . Prediabetes 02/23/2014  . Urticaria     Patient Active Problem List   Diagnosis Date Noted  . Facial pain 07/12/2016  . Rash 06/14/2016  . Acute exacerbation of COPD with asthma ([place]) 02/21/2016  . Migraine 01/25/2016  . Urticaria 01/23/2016  . Perennial allergic rhinitis 01/23/2016  . Oral candidiasis 01/23/2016  . Greater trochanteric bursitis of both hips 09/08/2015  . Chest pain 06/22/2015  . Itching 06/22/2015  . Bilateral knee pain 06/22/2015  . Bilateral hip pain 06/22/2015  . Anemia, iron deficiency 12/22/2014  . Fatigue 12/21/2014  . Intertrigo 08/07/2014  . Dyspareunia 07/12/2014  . Menopausal syndrome (hot flushes) 07/12/2014  . History of tobacco abuse 07/12/2014  . Dizziness 06/22/2014  . Weakness 06/22/2014  . Fibromyalgia 05/14/2014  . Routine general medical examination at a health care facility 04/22/2014  . Recurrent boils 04/22/2014  . Recurrent falls 04/22/2014  . Peripheral vascular disease ([place])   . Depression   . Anxiety   . GERD (gastroesophageal reflux disease)   . Prediabetes 02/23/2014  . COPD exacerbation ([place]) 02/23/2014  . Screening mammogram for high-risk patient 02/23/2014  . Back pain 07/22/2013  . COPD (chronic obstructive pulmonary disease) ([place]) 08/24/2009  . Headache(784.0) 08/24/2009  . HTN (hypertension) 05/12/2009  .  Allergic rhinitis 05/12/2009  . Asthma 05/12/2009    Past Surgical History:  Procedure Laterality Date  . ABDOMINAL HYSTERECTOMY    . BACK SURGERY    . COLONOSCOPY  12/20/2011   Procedure: COLONOSCOPY;  Surgeon: Landry Dyke, MD;  Location: [facility] ENDOSCOPY;  Service: Endoscopy;  Laterality: N/A;  . COLONOSCOPY  03/05/2012   Procedure: COLONOSCOPY;  Surgeon: Landry Dyke, MD;  Location: [facility] ENDOSCOPY;  Service: Endoscopy;  Laterality: N/A;  . DIAGNOSTIC  LAPAROSCOPY    . HEMORRHOID SURGERY    . INCISE AND DRAIN ABCESS    . KIDNEY STONE SURGERY    . NECK SURGERY    . TOE SURGERY    . TUBAL LIGATION      OB History    Gravida Para Term Preterm AB Living   7 3 3   4 3    SAB TAB Ectopic Multiple Live Births   3 1             Home Medications    Prior to Admission medications   Medication Sig Start Date End Date Taking? Authorizing Provider  albuterol (PROVENTIL) (2.5 MG/3ML) 0.083% nebulizer solution Take 2.5 mg by nebulization every 4 (four) hours as needed for wheezing or shortness of breath. Reported on 06/04/2016   Yes Historical Provider, MD  albuterol (VENTOLIN HFA) 108 (90 Base) MCG/ACT inhaler One to two puffs every 4 to 6 hours as needed 07/19/16  Yes Collene Gobble, MD  aspirin 325 MG tablet Take 325 mg by mouth daily.   Yes Historical Provider, MD  azelastine (ASTELIN) 0.1 % nasal spray USE 1-2 SPRAYS IN EACH NOSTRIL TWICE DAILY AS DIRECTED Patient taking differently: Place 2 sprays into both nostrils 3 (three) times daily. USE 1-2 SPRAYS IN EACH NOSTRIL TWICE DAILY AS DIRECTED 02/21/16  Yes Adelina Mings, MD  budesonide-formoterol [facility]) 160-4.5 MCG/ACT inhaler Inhale 2 puffs into the lungs 2 (two) times daily. 07/19/16  Yes Collene Gobble, MD  cholecalciferol (VITAMIN D) 1000 units tablet Take 1,000 Units by mouth daily.   Yes Historical Provider, MD  fluticasone (FLONASE) 50 MCG/ACT nasal spray Place 2 sprays into both nostrils 2 (two) times daily. 04/12/15  Yes Collene Gobble, MD  gabapentin (NEURONTIN) 100 MG capsule TAKE 2 CAPSULES BY MOUTH AT BEDTIME 01/25/16  Yes Lyndal Pulley, DO  irbesartan-hydrochlorothiazide (AVALIDE) 150-12.5 MG tablet TAKE 1 TABLET BY MOUTH EVERY DAY 01/16/16  Yes Biagio Borg, MD  meloxicam (MOBIC) 15 MG tablet Take 1 tablet (15 mg total) by mouth daily. 06/22/15  Yes Biagio Borg, MD  montelukast (SINGULAIR) 10 MG tablet Take 1 tablet (10 mg total) by mouth at bedtime. 07/02/15  Yes Lyndal Pulley, DO  pantoprazole (PROTONIX) 40 MG tablet Take 1 tablet (40 mg total) by mouth daily. 03/27/16  Yes Magdalen Spatz, NP  QUEtiapine (SEROQUEL) 100 MG tablet Take 1 tablet (100 mg total) by mouth at bedtime. 07/25/16  Yes Kathlee Nations, MD  tiotropium (SPIRIVA HANDIHALER) 18 MCG inhalation capsule Place 1 capsule (18 mcg total) into inhaler and inhale daily. 12/02/15  Yes Tammy S Parrett, NP  tiZANidine (ZANAFLEX) 4 MG tablet Take 1 tablet (4 mg total) by mouth every 6 (six) hours as needed for muscle spasms. 06/22/15  Yes Biagio Borg, MD  traMADol (ULTRAM) 50 MG tablet Take 1 tablet (50 mg total) by mouth every 6 (six) hours as needed. For pain. 11/17/13  Yes Thurnell Lose, MD  NONFORMULARY OR COMPOUNDED ITEM Estradiol 0.02 % 48ml prefilled applicator Sig: apply twice a week 07/12/14   Terrance Mass, MD    Family History Family History  Problem Relation Age of Onset  . Diabetes Mother   . COPD Mother   . Heart disease Mother   . Asthma Mother   . Diabetes Father   . Kidney disease Father   . Cancer Father   . Anesthesia problems Father   . Alcohol abuse Father   . Diabetes Sister   . Hypertension Sister   . Diabetes Brother   . Sleep apnea Brother   . Asthma Brother   . Alcohol abuse Brother   . Heart disease Sister   . Diabetes Sister   . Heart disease Brother   . Asthma Sister   . Allergic rhinitis Neg Hx   . Eczema Neg Hx   . Urticaria Neg Hx     Social History Social History  Substance Use Topics  . Smoking status: Current Some Day Smoker    Packs/day: 0.50    Years: 36.00    Types: Cigarettes  . Smokeless tobacco: Never Used     Comment: quit 02/2016 but started back --Pt states she is "an off and on smoker."    . Alcohol use No     Comment: drinks red wine occasionally     Allergies   Sulfa antibiotics and Sulfonamide derivatives   Review of Systems Review of Systems  Gastrointestinal: Positive for vomiting.  Neurological: Positive for headaches.    All other systems reviewed and are negative.    Physical Exam Updated Vital Signs BP 156/90   Pulse (!) 56   Temp 98.2 F (36.8 C) (Oral)   Resp 16   Ht 5\' 3"  (1.6 m)   Wt 160 lb (72.6 kg)   SpO2 100%   BMI 28.34 kg/m   Physical Exam  Constitutional: She is oriented to person, place, and time. She appears well-developed and well-nourished.  HENT:  Head: Normocephalic and atraumatic.  Mouth/Throat: Oropharynx is clear and moist.  No temporal tenderness  Eyes: EOM are normal. Pupils are equal, round, and reactive to light.  Cardiovascular: Normal rate and regular rhythm.   No murmur heard. Pulmonary/Chest: Effort normal and breath sounds normal. No respiratory distress.  Abdominal: Soft. There is no tenderness. There is no rebound and no guarding.  Musculoskeletal: She exhibits no edema or tenderness.  Neurological: She is alert and oriented to person, place, and time. No cranial nerve deficit.  Skin: Skin is warm and dry.  Psychiatric: She has a normal mood and affect. Her behavior is normal.  Nursing note and vitals reviewed.    ED Treatments / Results  Labs (all labs ordered are listed, but only abnormal results are displayed) Labs Reviewed  ACETAMINOPHEN LEVEL - Abnormal; Notable for the following:       Result Value   Acetaminophen (Tylenol), Serum <10 (*)    All other components within normal limits  COMPREHENSIVE METABOLIC PANEL - Abnormal; Notable for the following:    Chloride 99 (*)    All other components within normal limits  SALICYLATE LEVEL  CBC WITH DIFFERENTIAL/PLATELET    EKG  EKG Interpretation None       Radiology Ct Head Wo Contrast  Result Date: 07/28/2016 CLINICAL DATA:  Migraine headaches since Wednesday, no relief with over the counter medication, hypertension EXAM: CT HEAD WITHOUT CONTRAST TECHNIQUE: Contiguous axial images were obtained from the base of the skull  through the vertex without intravenous contrast. COMPARISON:   01/31/2012 FINDINGS: Normal ventricular morphology. No midline shift or mass effect. Normal appearance of brain parenchyma. No intracranial hemorrhage, mass lesion, evidence of acute infarction, or extra-axial fluid collection. Visualized paranasal sinuses and mastoid air cells clear. Bones unremarkable. IMPRESSION: Normal exam. Electronically Signed   By: Lavonia Dana M.D.   On: 07/28/2016 16:23    Procedures Procedures (including critical care time)  Medications Ordered in ED Medications  sodium chloride 0.9 % bolus 1,000 mL (0 mLs Intravenous Stopped 07/28/16 1725)  metoCLOPramide (REGLAN) injection 10 mg (10 mg Intravenous Given 07/28/16 1550)  diphenhydrAMINE (BENADRYL) injection 25 mg (25 mg Intravenous Given 07/28/16 1551)  dexamethasone (DECADRON) injection 10 mg (10 mg Intravenous Given 07/28/16 1722)     Initial Impression / Assessment and Plan / ED Course  I have reviewed the triage vital signs and the nursing notes.  Pertinent labs & imaging results that were available during my care of the patient were reviewed by me and considered in my medical decision making (see chart for details).  Clinical Course    Patient here for evaluation of headache, similar to prior headaches. She is nontoxic appearing on examination with no focal neurologic deficits. Presentation is not consistent with subarachnoid hemorrhage, meningitis, temporal arteritis, dural sinus thrombosis. Labs obtained given patient history of taking multiple doses of Goody powders as well as aspirin and Tylenol and states she took a lot but cannot describe how much she took. There is no evidence of acute acetaminophen or salicylate toxicity. Counseled patient on proper use of over-the-counter medications. Discussed home care for headache as well as outpatient follow-up and return precautions.  Final Clinical Impressions(s) / ED Diagnoses   Final diagnoses:  Bad headache    New Prescriptions Discharge Medication List as  of 07/28/2016  4:29 PM       Quintella Reichert, MD 07/29/16 [phone]

## 2016-07-28 NOTE — ED Notes (Signed)
Pt reports pain has decreased and the blurred vision in right eye is improving.  Pt reports she does have blurred vision in eyes and knows she needs an eye exam.

## 2016-08-01 ENCOUNTER — Encounter (HOSPITAL_COMMUNITY): Payer: Self-pay | Admitting: Clinical

## 2016-08-22 ENCOUNTER — Ambulatory Visit (HOSPITAL_COMMUNITY): Payer: Self-pay | Admitting: Clinical

## 2016-08-23 ENCOUNTER — Telehealth: Payer: Self-pay | Admitting: Emergency Medicine

## 2016-08-23 NOTE — Telephone Encounter (Signed)
Called spoke with pt. She c/o SOB starting on 08/21/16 chest tightness, cough with clear mucus. Pt states she has done two breathing treatments this morning one at 5am and 9:30am. These treatments have seemed to help. She also is taking Hydromet every 6 hours for the cough. She denies any wheezing, fever, nausea or vomiting. I offered her an ov today at 11:30 with JN pt refused stating that she already had Hydromet and did not feel safe getting behind the wheel to drive here due to it causing sleepiness. She states that if her breathing worsens she knows to call 911 ASAP. She request an ov on 08/24/16 after she takes her grandson to school. I scheduled her with AD on 08/24/16 at 9:30am. She voiced understanding and had no further questions. Nothing further needed at this time.

## 2016-08-24 ENCOUNTER — Encounter: Payer: Self-pay | Admitting: Pulmonary Disease

## 2016-08-24 ENCOUNTER — Ambulatory Visit (INDEPENDENT_AMBULATORY_CARE_PROVIDER_SITE_OTHER): Payer: Medicare Other | Admitting: Pulmonary Disease

## 2016-08-24 DIAGNOSIS — J3089 Other allergic rhinitis: Secondary | ICD-10-CM | POA: Diagnosis not present

## 2016-08-24 DIAGNOSIS — J441 Chronic obstructive pulmonary disease with (acute) exacerbation: Secondary | ICD-10-CM | POA: Diagnosis not present

## 2016-08-24 MED ORDER — RANITIDINE HCL 150 MG PO TABS: 150.0000 mg | ORAL_TABLET | Freq: Two times a day (BID) | ORAL | 5 refills | Status: DC

## 2016-08-24 MED ORDER — PREDNISONE 10 MG PO TABS: 20.0000 mg | ORAL_TABLET | Freq: Every day | ORAL | 0 refills | Status: AC

## 2016-08-24 MED ORDER — CETIRIZINE HCL 10 MG PO TABS: 10.0000 mg | ORAL_TABLET | Freq: Every day | ORAL | 5 refills | Status: DC

## 2016-08-24 MED ORDER — FLUTICASONE PROPIONATE 50 MCG/ACT NA SUSP: 2.0000 | Freq: Two times a day (BID) | NASAL | 5 refills | Status: DC

## 2016-08-24 NOTE — Assessment & Plan Note (Signed)
Patient presents with generalized pruritus the last week and one day history of cough and wheezing and more shortness  of breath. Has wheezing on physical examination. Not in distress. We'll treat her as an acute exacerbation of COPD. No evidence for infection right now. It's possible, she had an allergic reaction to something and that set off the COPD flareup. Plan : 1. Prednisone, 20 mg daily for 7 days. Historically, she gets better with one week of prednisone when this happens to her. 2. Cont Symbicort, 2 puffs twice a day and albuterol when necessary. 3. Rx allergy/  4. Pt to call back if with worsening sx.

## 2016-08-24 NOTE — Patient Instructions (Signed)
It was a pleasure taking care of you today!  Your allergies are acting up.  We will start prednisone, 20 mg a day, one tablet a day for 7 days. Continue Symbicort as instructed. Re-start Flonase, 2 squirts per nostril at bedtime. Continue Singulair, 1 tablet daily at bedtime. Re-Start Zyrtec, 10 mg per tablet, 1 tablet daily. Start Zantac, 150 mg per tablet, 1 tablet twice a day. Please make sure you see your allergy doctor by next week. Follow-up with Dr. Lamonte Sakai as scheduled. Call the office next week if not better.

## 2016-08-24 NOTE — Assessment & Plan Note (Signed)
Patient presents with generalized pruritus the last week. Unknown trigger. Has been happening episodically.  Plan: 1. prednsione 20 mg/d x 7 days.  2. Rx COPD 3. Start Flonase 2 squirts per nostril at bedtime. 4. Continue Singulair daily. 5. Start Zantac, 150 mg twice a day. 6. Start zyrtec 10 mg daily. 7. She has an allergy doctor. She is not able to schedule an appointment today. She should be able to see the allergy doctor within the week. Needs further workup of for allergies.

## 2016-08-24 NOTE — Progress Notes (Signed)
Subjective:    Patient ID: [name], female    DOB: 07-24-1961, 55 y.o.   MRN: [id number]  HPI Patient is being seen in the office for severe COPD with frequent exacerbations and upper airway cough syndrome.  ROV 08/24/16 Patient is seen in the office urgently for allergy flareup. She has episodic "body itching" which gets better with prednisone in the past. She currently sees an allergy doctor. She also has COPD for which she takes inhalers. She had generalized pruritus Labor Day. This persisted until today. Yesterday, she felt tight with her breathing and was coughing more and was nauseated. Denies any fevers and chills. Today, she is better but still with generalized pruritus. She tried to see her allergy doctor but could not see the doctor sooner. She has been abstinent of smoking since September 1. Last time she is marijuana was in July.   Review of Systems  Constitutional: Negative.        Generalized pruritus  HENT: Positive for congestion and sinus pressure.   Eyes: Negative.   Respiratory: Positive for cough and shortness of breath.   Cardiovascular: Negative.   Gastrointestinal: Negative.   Endocrine: Negative.   Genitourinary: Negative.   Musculoskeletal: Negative.   Skin: Negative.   Allergic/Immunologic: Negative.   Neurological: Negative.   Hematological: Negative.   Psychiatric/Behavioral: Negative.        Objective:   Physical Exam  Vitals:  Vitals:   08/24/16 0953  BP: 128/78  Pulse: 74  SpO2: 96%  Weight: 166 lb (75.3 kg)  Height: 5\' 3"  (1.6 m)    Constitutional/General:  Pleasant, well-nourished, well-developed, not in any distress,  Comfortably seating.  Well kempt (-) rash seen.   Body mass index is 29.41 kg/m. Wt Readings from Last 3 Encounters:  08/24/16 166 lb (75.3 kg)  07/28/16 160 lb (72.6 kg)  07/12/16 164 lb (74.4 kg)    HEENT: Pupils equal and reactive to light and accommodation. Anicteric sclerae. Normal nasal mucosa.     No oral  lesions,  mouth clear,  oropharynx clear, no postnasal drip. (-) Oral thrush. No dental caries.  Airway - Mallampati class III-IV.   Neck: No masses. Midline trachea. No JVD, (-) LAD. (-) bruits appreciated.  Respiratory/Chest: Grossly normal chest. (-) deformity. (-) Accessory muscle use.  Symmetric expansion. (-) Tenderness on palpation.  Resonant on percussion.  Diminished BS on both lower lung zones. (-)crackles, rhonchi. Wheezing in both lung fields. (-) egophony  Cardiovascular: Regular rate and  rhythm, heart sounds normal, no murmur or gallops, no peripheral edema  Gastrointestinal:  Normal bowel sounds. Soft, non-tender. No hepatosplenomegaly.  (-) masses.   Musculoskeletal:  Normal muscle tone. Normal gait.   Extremities: Grossly normal. (-) clubbing, cyanosis.  (-) edema  Skin: (-) rash,lesions seen.   Neurological/Psychiatric : alert, oriented to time, place, person. Normal mood and affect          Assessment & Plan:  COPD exacerbation Patient presents with generalized pruritus the last week and one day history of cough and wheezing and more shortness  of breath. Has wheezing on physical examination. Not in distress. We'll treat her as an acute exacerbation of COPD. No evidence for infection right now. It's possible, she had an allergic reaction to something and that set off the COPD flareup. Plan : 1. Prednisone, 20 mg daily for 7 days. Historically, she gets better with one week of prednisone when this happens to her. 2. Cont Symbicort, 2 puffs twice a  day and albuterol when necessary. 3. Rx allergy/  4. Pt to call back if with worsening sx.   Allergic rhinitis Patient presents with generalized pruritus the last week. Unknown trigger. Has been happening episodically.  Plan: 1. prednsione 20 mg/d x 7 days.  2. Rx COPD 3. Start Flonase 2 squirts per nostril at bedtime. 4. Continue Singulair daily. 5. Start Zantac, 150 mg twice a day. 6. Start  zyrtec 10 mg daily. 7. She has an allergy doctor. She is not able to schedule an appointment today. She should be able to see the allergy doctor within the week. Needs further workup of for allergies.   Return to clinic in with Dr.  Lamonte Sakai as scheduled unless worse.    Monica Becton, MD 08/24/2016, 10:34 AM [facility] Pager [phone] After 3 pm or if no answer, call [phone]

## 2016-09-04 ENCOUNTER — Ambulatory Visit (HOSPITAL_COMMUNITY): Payer: Self-pay | Admitting: Psychiatry

## 2016-09-06 ENCOUNTER — Ambulatory Visit (INDEPENDENT_AMBULATORY_CARE_PROVIDER_SITE_OTHER): Payer: Medicare Other | Admitting: Clinical

## 2016-09-06 DIAGNOSIS — F250 Schizoaffective disorder, bipolar type: Secondary | ICD-10-CM

## 2016-09-06 DIAGNOSIS — F4310 Post-traumatic stress disorder, unspecified: Secondary | ICD-10-CM

## 2016-09-06 NOTE — Progress Notes (Signed)
   THERAPIST PROGRESS NOTE  Session Time: 11:05 - 12:00   Participation Level: Active  Behavioral Response: CasualAlertAnxious and Depressed  Type of Therapy: Individual Therapy  Treatment Goals addressed: improve psychiatric symptoms, more balanced mood (anger and stress management), improve unhelpful thought patterns, interpersonal relationship skills (be able  to be around people, improved communication skills ), controlled behavior, moderated mood, and more deliberative speech and thought process (improved social functioning), healthy coping skills  Interventions: CBT and Motivational Interviewing psychoeducation, grounding and mindfulness techniques  Summary: [name] is a 55 y.o. female who presents with Schizoaffective disorder, bipolar type, post-traumatic stress disorder  Suicidal/Homicidal: No without intent/plan  Therapist Response: Leda Gauze met with clinician for an individual session. Rhiana shared about her psychiatric symptoms, her current life events and her homework. Dollye shared that that she was having a a lot of emotions included angry depressed irritated. She shared that she is having a rough time because she is been fussing with her daughter and her ex-mother-in-law over issues with her grandson. Clinician asked open ended questions and Sharlette shared what was in her power to change and what was not. Ilina shared that she had recently had an angry outburst. Clinician asked open ended questions. Jameka identified her negative thoughts and emotions. Shameca identified the evidence for and against the thoughts. Jestine had the insight that all some of her negative thoughts were correct there would be better ways to handle. Clinician asked open ended questions. Lashawn was able to identify healthier ways to handle the conflict. Client and clinician discussed anger and stress management. Client and clinician agreed to discuss this topic further at future sessions.  Clinician introduced grounding and mindfulness techniques. Client and clinician practice some of the grounding and mindfulness techniques together. Tylar agreed to practice the techniques and tell next session.  Plan: Return again in 1-2 weeks.  Diagnosis: Axis I: Schizoaffective disorder, bipolar type, post-traumatic stress disorder     Dexton Zwilling A, LCSW 09/06/2016

## 2016-09-12 ENCOUNTER — Ambulatory Visit (INDEPENDENT_AMBULATORY_CARE_PROVIDER_SITE_OTHER): Payer: Medicare Other | Admitting: Allergy & Immunology

## 2016-09-12 ENCOUNTER — Encounter: Payer: Self-pay | Admitting: Allergy & Immunology

## 2016-09-12 VITALS — BP 130/78 | HR 66 | Temp 98.2°F | Resp 16

## 2016-09-12 DIAGNOSIS — L508 Other urticaria: Secondary | ICD-10-CM | POA: Diagnosis not present

## 2016-09-12 DIAGNOSIS — J310 Chronic rhinitis: Secondary | ICD-10-CM

## 2016-09-12 DIAGNOSIS — J449 Chronic obstructive pulmonary disease, unspecified: Secondary | ICD-10-CM

## 2016-09-12 MED ORDER — EPINEPHRINE 0.3 MG/0.3ML IJ SOAJ: 0.3000 mg | Freq: Once | INTRAMUSCULAR | 1 refills | Status: AC

## 2016-09-12 NOTE — Patient Instructions (Addendum)
1. Asthma-COPD overlap syndrome (HCC) - Prednisone has been provided, 40 mg x3 days, 20 mg x1 day, 10 mg x1 day, then stop. - Continue with Symbicort 160/4.5 two puffs twice daily. - Use the spacer every time. - Continue with Singulair 10mg  daily.  2. Chronic rhinitis - Continue with nasal saline rinses daily. - Continue with nasal steroid (Flonase) two sprays per nostril once daily.  3. Chronic urticaria with pruritis - Continue with antihistamines (cetirizine) but increase to two tablets every night.  - We will start the paperwork for Xolair, which is a monthly injection to help with hives. - Xolair can also help with asthma as well.   4. Return in about 4 months (around 01/12/2017).  Please inform us of any Emergency Department visits, hospitalizations, or changes in symptoms. Call us before going to the ED for breathing or allergy symptoms since we might be able to fit you in for a sick visit. Feel free to contact us anytime with any questions, problems, or concerns.  It was a pleasure to meet you today!   Websites that have reliable patient information: 1. American Academy of Asthma, Allergy, and Immunology: www.aaaai.org 2. Food Allergy Research and Education (FARE): foodallergy.org 3. Mothers of Asthmatics: http://www.asthmacommunitynetwork.org 4. American College of Allergy, Asthma, and Immunology: www.acaai.org

## 2016-09-12 NOTE — Progress Notes (Signed)
FOLLOW UP  Date of Service/Encounter:  09/12/16   Assessment:   Asthma-COPD overlap syndrome (HCC)  Chronic rhinitis  Chronic urticaria   Asthma Reportables:  Severity: severe persistent  Risk: high Control: not well controlled  Seasonal Influenza Vaccine: no but encouraged     Plan/Recommendations:   1. Asthma-COPD overlap syndrome (HCC) - Spirometry values are not great today, but they are stable compared to her last visit.  - In contrast to her last visit, she did have reversibility today and sounded better after the nebulizer treatment. - Prednisone has been provided, 40 mg x3 days, 20 mg x1 day, 10 mg x1 day, then stop. - Continue with Symbicort 160/4.5 two puffs twice daily. - I recommended that [name] use the spacer every time. - Continue with Singulair 10mg  daily. - The Xolair that we are considering starting for hives should help with her asthma as well. - Continue to follow-up with Dr. Lamonte Sakai.  2. Chronic rhinitis - Continue with nasal saline rinses daily. - Continue with nasal steroid (Flonase) two sprays per nostril once daily. - I did ask [name] to call at the end of the week if her symptoms continue, at which time we can call in an antibiotic.   3. Chronic urticaria with pruritis - Continue with antihistamines (cetirizine) but increase to two tablets every night.  - We will start the paperwork for Xolair, which is a monthly injection to help with hives. - Xolair can also help with asthma as well.   4. Return in about 4 months (around 01/12/2017).   Subjective:   [name] is a 55 y.o. female presenting today for follow up of  Chief Complaint  Patient presents with  . Urticaria    Inceased since Monday.  . Asthma    Increased flare recently  . Nasal Congestion  .  [name] has a history of the following: Patient Active Problem List   Diagnosis Date Noted  . Facial pain 07/12/2016  . Rash 06/14/2016    . Acute exacerbation of COPD with asthma ([place]) 02/21/2016  . Migraine 01/25/2016  . Urticaria 01/23/2016  . Perennial allergic rhinitis 01/23/2016  . Oral candidiasis 01/23/2016  . Greater trochanteric bursitis of both hips 09/08/2015  . Chest pain 06/22/2015  . Itching 06/22/2015  . Bilateral knee pain 06/22/2015  . Bilateral hip pain 06/22/2015  . Anemia, iron deficiency 12/22/2014  . Fatigue 12/21/2014  . Intertrigo 08/07/2014  . Dyspareunia 07/12/2014  . Menopausal syndrome (hot flushes) 07/12/2014  . History of tobacco abuse 07/12/2014  . Dizziness 06/22/2014  . Weakness 06/22/2014  . Fibromyalgia 05/14/2014  . Routine general medical examination at a health care facility 04/22/2014  . Recurrent boils 04/22/2014  . Recurrent falls 04/22/2014  . Peripheral vascular disease ([place])   . Depression   . Anxiety   . GERD (gastroesophageal reflux disease)   . Prediabetes 02/23/2014  . COPD exacerbation ([place]) 02/23/2014  . Screening mammogram for high-risk patient 02/23/2014  . Back pain 07/22/2013  . COPD (chronic obstructive pulmonary disease) ([place]) 08/24/2009  . Headache(784.0) 08/24/2009  . HTN (hypertension) 05/12/2009  . Allergic rhinitis 05/12/2009  . Asthma 05/12/2009    History obtained from: chart review and patient.  [name] was referred by Cathlean Cower, MD.     [name] is a 55 y.o. female presenting for a follow up visit for asthma, allergies, and hives. She was last seen Dr. Verlin Fester in May 2017 for [place] asthma/COPD flare.  At that time, she was having sinus pressure, congestion, nasal pruritus, and ear fullness. She was also having increased wheezing and dyspnea as well as rescue inhaler use. She was treated with a burst of steroids and recommended to continue on her inhaled medications. She was also treated with prednisone and saline for her sinusitis. Astelin was added to help her with allergic rhinitis.  [name] reports that she started having  chest pain on the left with viral symptoms on Monday after visiting a friend in the hospital. Then she started having headaches and sinus pressure with tooth pain. She has had no fevers. She has been using her rescue inhaler late last night around 11pm (nebulizerr). She does have nasal saline and a nose spray. She also endorses itching all over.   [name] asthma has been well controlled. She has not required rescue medication, experienced nocturnal awakenings due to lower respiratory symptoms, nor have activities of daily living been limited. She knows that she needs to stop smoking and now is down to three cigarettes per day at the most. She remains on Symbicort 160/4.5 two puffs twice daily. She does have a spacer but does not know to use. She remains on Singulair 10mg  at bedtime. The patient estimates that she is on prednisone every 1-2 months, given either by Dr. Verlin Fester or Dr. Lamonte Sakai.   [name] is also dorsing chronic pruritis and hives. They appear randomly throughout the day and night. They are messing up her rest and have made her quite irritable. She does not have any systemic symptoms with the hives. There are no particular triggers including soap, food, and laundry detergent. She has never been on Xolair but has tried various iterations of antihistamines.  Otherwise, there have been no changes to the past medical history, surgical history, family history, or social history.     Review of Systems: a 14-point review of systems is pertinent for what is mentioned in HPI.  Otherwise, all other systems were negative. Constitutional: negative other than that listed in the HPI Eyes: negative other than that listed in the HPI Ears, nose, mouth, throat, and face: negative other than that listed in the HPI Respiratory: negative other than that listed in the HPI Cardiovascular: negative other than that listed in the HPI Gastrointestinal: negative other than that listed in the  HPI Genitourinary: negative other than that listed in the HPI Integument: negative other than that listed in the HPI Hematologic: negative other than that listed in the HPI Musculoskeletal: negative other than that listed in the HPI Neurological: negative other than that listed in the HPI Allergy/Immunologic: negative other than that listed in the HPI    Objective:   Blood pressure 130/78, pulse 66, temperature 98.2 F (36.8 C), temperature source Oral, resp. rate 16, SpO2 98 %. There is no height or weight on file to calculate BMI.   Physical Exam:  General: Alert, interactive, in no acute distress. Cooperative with the exam. Very adorable eldery female.  HEENT: TMs pearly gray, turbinates edematous with clear discharge, post-pharynx erythematous. Neck: Supple without thyromegaly. Lungs: Decreased breath sounds with end expiratory wheezing bilaterally. Increased work of breathing. CV: Normal S1, S2 without murmurs. Capillary refill <2 seconds.  Abdomen: Nondistended, nontender. No guarding or rebound tenderness..  Skin: Multiple urticarial lesions scattered on the bilateral arms.  Extremities:  No clubbing, cyanosis or edema. Neuro:   Grossly intact.   Diagnostic studies:  Spirometry: results abnormal (FEV1: 0.67/31%%, FVC: 1.31/48%%, FEV1/FVC: 51%%).    Spirometry  consistent with moderate obstructive disease with severe restriction. We did provide DuoNeb nebulizer treatment in clinic today with a 280 mL (10%) increase in FVC and a 130 mL (6%) increase in FEV1.  Previous spirometry from May 2017 reveals an FVC of 1.28 and an FEV1 of 0.72.  Allergy Studies: None     Salvatore Marvel, MD [place] [facility] [place]

## 2016-09-13 ENCOUNTER — Encounter (HOSPITAL_COMMUNITY): Payer: Self-pay | Admitting: Clinical

## 2016-09-17 ENCOUNTER — Other Ambulatory Visit: Payer: Self-pay | Admitting: Internal Medicine

## 2016-09-17 ENCOUNTER — Telehealth: Payer: Self-pay

## 2016-09-17 ENCOUNTER — Ambulatory Visit (HOSPITAL_COMMUNITY): Payer: Self-pay | Admitting: Clinical

## 2016-09-17 DIAGNOSIS — Z1231 Encounter for screening mammogram for malignant neoplasm of breast: Secondary | ICD-10-CM

## 2016-09-17 NOTE — Telephone Encounter (Signed)
PLEASE ADVISE.

## 2016-09-17 NOTE — Telephone Encounter (Signed)
Patient seen Dr. Ernst Bowler on 09/12/16 and would like to go ahead and start xolair shots.   Thanks

## 2016-09-17 NOTE — Telephone Encounter (Signed)
Already talked to patient and submitted to pharmacy
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
## 2016-09-18 NOTE — Telephone Encounter (Addendum)
Thank you, Tammy and Morey Hummingbird! I appreciate your help.  Salvatore Marvel, MD [place] [facility] [place]

## 2016-09-19 ENCOUNTER — Encounter (HOSPITAL_COMMUNITY): Payer: Self-pay | Admitting: Psychiatry

## 2016-09-19 ENCOUNTER — Ambulatory Visit (INDEPENDENT_AMBULATORY_CARE_PROVIDER_SITE_OTHER): Payer: Medicare Other | Admitting: Psychiatry

## 2016-09-19 VITALS — BP 122/74 | HR 81 | Ht 63.0 in | Wt 170.0 lb

## 2016-09-19 DIAGNOSIS — F319 Bipolar disorder, unspecified: Secondary | ICD-10-CM

## 2016-09-19 DIAGNOSIS — F3132 Bipolar disorder, current episode depressed, moderate: Secondary | ICD-10-CM

## 2016-09-19 MED ORDER — QUETIAPINE FUMARATE 100 MG PO TABS: 100.0000 mg | ORAL_TABLET | Freq: Every day | ORAL | 2 refills | Status: DC

## 2016-09-19 MED ORDER — DIVALPROEX SODIUM ER 500 MG PO TB24: 500.0000 mg | ORAL_TABLET | Freq: Every day | ORAL | 2 refills | Status: DC

## 2016-09-19 NOTE — Patient Instructions (Addendum)
1. Start Depakote 500 mg at night 2. Continue seroquel 100 mg at night 3. Return to clinic in 3 months

## 2016-09-25 ENCOUNTER — Ambulatory Visit (INDEPENDENT_AMBULATORY_CARE_PROVIDER_SITE_OTHER): Payer: Medicare Other | Admitting: *Deleted

## 2016-09-25 DIAGNOSIS — L501 Idiopathic urticaria: Secondary | ICD-10-CM | POA: Diagnosis not present

## 2016-09-25 DIAGNOSIS — L508 Other urticaria: Secondary | ICD-10-CM

## 2016-09-25 MED ORDER — OMALIZUMAB 150 MG ~~LOC~~ SOLR
300.0000 mg | SUBCUTANEOUS | Status: DC
  Administered 2016-09-25 – 2018-08-12 (×23): 300 mg via SUBCUTANEOUS

## 2016-09-27 ENCOUNTER — Ambulatory Visit
Admission: RE | Admit: 2016-09-27 | Discharge: 2016-09-27 | Disposition: A | Discharge status: Home / Self Care | Payer: Medicare Other | Source: Ambulatory Visit | Attending: Internal Medicine | Admitting: Internal Medicine

## 2016-09-27 DIAGNOSIS — Z1231 Encounter for screening mammogram for malignant neoplasm of breast: Secondary | ICD-10-CM

## 2016-10-04 ENCOUNTER — Ambulatory Visit (HOSPITAL_COMMUNITY): Payer: Self-pay | Admitting: Clinical

## 2016-10-10 DIAGNOSIS — L259 Unspecified contact dermatitis, unspecified cause: Secondary | ICD-10-CM | POA: Diagnosis not present

## 2016-10-10 DIAGNOSIS — L700 Acne vulgaris: Secondary | ICD-10-CM | POA: Diagnosis not present

## 2016-10-16 ENCOUNTER — Telehealth: Payer: Self-pay | Admitting: Emergency Medicine

## 2016-10-16 ENCOUNTER — Encounter: Payer: Self-pay | Admitting: Emergency Medicine

## 2016-10-16 ENCOUNTER — Ambulatory Visit (INDEPENDENT_AMBULATORY_CARE_PROVIDER_SITE_OTHER): Payer: Medicare Other | Admitting: Emergency Medicine

## 2016-10-16 ENCOUNTER — Ambulatory Visit (HOSPITAL_COMMUNITY): Payer: Self-pay | Admitting: Clinical

## 2016-10-16 DIAGNOSIS — J45901 Unspecified asthma with (acute) exacerbation: Secondary | ICD-10-CM

## 2016-10-16 DIAGNOSIS — J3089 Other allergic rhinitis: Secondary | ICD-10-CM

## 2016-10-16 MED ORDER — ALBUTEROL SULFATE HFA 108 (90 BASE) MCG/ACT IN AERS: INHALATION_SPRAY | RESPIRATORY_TRACT | 5 refills | Status: DC

## 2016-10-16 MED ORDER — PANTOPRAZOLE SODIUM 40 MG PO TBEC: 40.0000 mg | DELAYED_RELEASE_TABLET | Freq: Every day | ORAL | 5 refills | Status: DC

## 2016-10-16 MED ORDER — AZELASTINE HCL 0.1 % NA SOLN: NASAL | 5 refills | Status: DC

## 2016-10-16 MED ORDER — FLUTICASONE PROPIONATE 50 MCG/ACT NA SUSP: 2.0000 | Freq: Every day | NASAL | 5 refills | Status: DC

## 2016-10-16 MED ORDER — PREDNISONE 10 MG PO TABS: ORAL_TABLET | ORAL | 0 refills | Status: DC

## 2016-10-16 NOTE — Patient Instructions (Signed)
Please continue your Spiriva and Symbicort  We will refill your albuterol, use 2 puffs up to every 4 hours if needed for shortness of breath.  We will refill protonix, use 40mg  once a day  We will refill your flonase nasal spray, use 2 sprays each nostril daily We will refill your astelin nasal spray, use 2 sprays 2-3x a day as needed for nasal congestion.  Continue loratadine 10mg  daily Continue your Xolair per your Allergist's schedule.  Follow with Dr Lamonte Sakai in 4 months or sooner if you have any problems.

## 2016-10-16 NOTE — Assessment & Plan Note (Signed)
With worse control over the last month. She has run out of multiple meds, all of which are making asthma harder to manage.   Please continue your Spiriva and Symbicort  We will refill your albuterol, use 2 puffs up to every 4 hours if needed for shortness of breath.  We will refill protonix, use 40mg  once a day  We will refill your flonase nasal spray, use 2 sprays each nostril daily We will refill your astelin nasal spray, use 2 sprays 2-3x a day as needed for nasal congestion.  Continue loratadine 10mg  daily Continue your Xolair per your Allergist's schedule.  Follow with Dr Lamonte Sakai in 4 months or sooner if you have any problems.

## 2016-10-16 NOTE — Telephone Encounter (Signed)
Spoke with the pt  She is c/o chest tightness "feels like an elephant on my chest" She was seen here today, and these symptoms were discussed  She states wanting pred taper called in  Per RB- this is okay, but should try following recs from ov first  I have made pt aware and pred called in in case ov recs don't help She will call as needed

## 2016-10-16 NOTE — Progress Notes (Signed)
Subjective:    Patient ID: [name], female    DOB: 09-28-1961, 55 y.o.   MRN: [id number]  HPI    Expand All Collapse All   55  yo former smoker with hx HTN, migraine HA, asthma dx in 2000 after a hospitalization for dyspnea. No asthma as a child. Had been managed on Advair 250 (ran out), now on Dulera + SABA since hosp. She was hospitalized 1/7 - 1/13 for an acute exacerbation. She has been doing fairly well, is having some stabbing pains with exertion (prior cards eval was reassuring). She had gone back to smoking since last Summer, just quit again when hospitalized. She is weaning pred now. Uses albuterol every day at least once. She is planning to restart exercise routine. She is taking benadryl for itching. She is having a lot of nasal gtt and UA irritation.    Acute OV 05/28/13 -- she has been having more problems with congestion, more problems with wheeze, cough, dyspnea. She was on Dulera, nasonex - hasn't been able to take in months because she lost her job.   ROV 08/07/13 -- f/u COPD, rhinitis, cough. Was seen since last visit by Dr Melvyn Novas with wheeze, sputum, tightness. Was not taking her BD's - couldn;t afford. Was treated for sinusitis with augmentin and pred. Also changed to bystolic. 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 Using dulera bid, albuterol 2-3 x a day.   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 She had stopped smoking for 2 months, has started back now about 1/2 pk a day. She is on [place] - feels that she benefited from the addition of spiriva. She was just treated with prednisone for an AE with good resolution > she believes it was house full of smoke that triggered her. Uses albuterol every day. Minimal cough or wheeze.   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 She has been doing fairly well, although she just moved to an apartment with 3  flights of stairs. She quit smoking June 2. She is having some insomnia and has been eating a lot. Her cough has been stable. She was just treated for otitis media. She restarted flonase.   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 I treated her for an AE 6/26 (azithro + pred) > her wheeze improved but she is dealing with fatigue, dizziness, weakness when walking. She has seen some vaginal bleeding x 1 (even though has had a hysterectomy). She has noted bruising on her arms and her thighs since this last weekend.   ROV 10/18/14 -- follow up for severe AFL, COPD with a recent AE treated by TP. Returns for f/u. She is improved but states that her energy is low. She is still smoking a little - every other day. She is on spiriva and dulera. She uses albuterol every day.       [name] presents to clinic today complaining of 2 weeks of increasing chest tightness, shortness of breath and cough productive of green mucus. She has had some minor sinus symptoms during this time. She has been taking Mucinex over-the-counter which is not helped with her symptoms. She continues to smoke cigarettes at about 5 cigarettes per day. She stopped taking her inhaled therapies about 2 months ago for unclear reasons. She says that she has started to have some increasing shortness  of breath in the evenings which is unusual for her. She has been using her albuterol frequently with this has not helped. She denies fever but she does note chills. She has not had night sweats. She has had a sick contact with her grandchild who was sick last week.  ROV 04/12/15 -- follow-up visit for severe obstructive lung disease, tobacco use, dyspnea. She was seen by Dr Lake Bells a month ago for apparent acute exacerbation. She was treated with prednisone taper and Ceftin. She remains on [place]. She is still finishing pred, has finished the abx. She hasn't smoked for 7 days. Not taking loratadine.    ROV 07/12/16 --  Patient follows up for history of tobacco use, severe obstructive lung disease with frequent exacerbations. She reports today that she is having R ear fullness, intact hearing, R facial pain, R tooth pain. Has been happening since Monday. No nasal gtt or discharge. No fevers. She is on astelin NS, flonase NS prn. Using Symbicort, singulair, Spiriva. Albuterol nebs. She has been seen several times since her last visit with me by other providers, treated for possible exacerbations. She takes benadryl prn.   ROV 10/16/16 -- this is a follow-up visit for patient with a history of bipolar disorder, continued tobacco use, severe obstructive lung disease with frequent exacerbations. She also has a history of allergic rhinitis. She had an acute exacerbation of her COPD in September. Currently managed on Spiriva, Symbicort, Singulair. She is on Xolair per her allergist. She is complaining of 2 weeks of increased nasal congestion, throat mucous, dyspnea. She is using albuterol more frequently. She ran out of flonase Sunday, needs a refill. She does not astelin a month ago. She stopped loratadine a month ago as well. She ran out of protonix a month ago as well.    Past Medical History:  Diagnosis Date  . ALLERGIC RHINITIS 05/12/2009   Qualifier: Diagnosis of  By: Lenn Cal Deborra Medina), Wynona Canes    . Anemia, iron deficiency 12/22/2014  . Angina   . Anxiety   . Arthritis   . Asthma   . ASTHMA 05/12/2009   Severe AFL (Spirometry 05/2009: pre-BD FEV1 0.87L 34% pred, post-BD FEV1 1.11L 44% pred) Volumes hyperinflated Decreased DLCO that does not fully correct to normal range for alveolar volume.     Marland Kitchen COPD 08/24/2009   Qualifier: Diagnosis of  By: Burnett Kanaris    . COPD (chronic obstructive pulmonary disease) ([place])   . Depression   . Fibromyalgia 05/14/2014  . GERD (gastroesophageal reflux disease)   . Hypertension   . HYPERTENSION 05/12/2009   Qualifier: Diagnosis of  By: Lenn Cal Deborra Medina), Wynona Canes    . Migraine   .  Peripheral vascular disease ([place])   . Prediabetes 02/23/2014  . Urticaria       Review of Systems  Constitutional: Positive for fatigue. Negative for chills and fever.  HENT: Negative for postnasal drip, rhinorrhea and sinus pressure.   Respiratory: Positive for cough, shortness of breath and wheezing.   Cardiovascular: Positive for chest pain. Negative for palpitations and leg swelling.       Objective:   Physical Exam Vitals:   10/16/16 1141  BP: 130/88  Pulse: 76  SpO2: 97%  Weight: 169 lb (76.7 kg)  Height: 5\' 3"  (1.6 m)  Gen: Pleasant, well-nourished, in no distress,  normal affect  ENT: No lesions,  mouth clear,  oropharynx clear, no postnasal drip  Neck: No JVD, no TMG, no carotid bruits  Lungs: No use  of accessory muscles, distant, few end exp wheezes  Cardiovascular: RRR, heart sounds normal, no murmur or gallops, no peripheral edema  Abdomen: soft and NT, no HSM,  BS normal  Musculoskeletal: No deformities, no cyanosis or clubbing  Neuro: alert, non focal  Skin: Warm, no lesions or rashes          Assessment & Plan:   Asthma With worse control over the last month. She has run out of multiple meds, all of which are making asthma harder to manage.   Please continue your Spiriva and Symbicort  We will refill your albuterol, use 2 puffs up to every 4 hours if needed for shortness of breath.  We will refill protonix, use 40mg  once a day  We will refill your flonase nasal spray, use 2 sprays each nostril daily We will refill your astelin nasal spray, use 2 sprays 2-3x a day as needed for nasal congestion.  Continue loratadine 10mg  daily Continue your Xolair per your Allergist's schedule.  Follow with Dr Lamonte Sakai in 4 months or sooner if you have any problems.  Baltazar Apo, MD, PhD 10/16/2016, 12:00 PM [facility] [phone] or if no answer [phone]   Updated Medication List Outpatient Encounter Prescriptions as of 10/16/2016    Medication Sig  . albuterol (PROVENTIL) (2.5 MG/3ML) 0.083% nebulizer solution Take 2.5 mg by nebulization every 4 (four) hours as needed for wheezing or shortness of breath. Reported on 06/04/2016  . albuterol (VENTOLIN HFA) 108 (90 Base) MCG/ACT inhaler One to two puffs every 4 to 6 hours as needed  . aspirin 325 MG tablet Take 325 mg by mouth daily.  Marland Kitchen azelastine (ASTELIN) 0.1 % nasal spray USE 1-2 SPRAYS IN EACH NOSTRIL TWICE DAILY AS DIRECTED (Patient taking differently: Place 2 sprays into both nostrils 3 (three) times daily. USE 1-2 SPRAYS IN EACH NOSTRIL TWICE DAILY AS DIRECTED)  . budesonide-formoterol (SYMBICORT) 160-4.5 MCG/ACT inhaler Inhale 2 puffs into the lungs 2 (two) times daily.  . cetirizine (ZYRTEC) 10 MG tablet Take 1 tablet (10 mg total) by mouth daily.  . cholecalciferol (VITAMIN D) 1000 units tablet Take 1,000 Units by mouth daily.  . divalproex (DEPAKOTE ER) 500 MG 24 hr tablet Take 1 tablet (500 mg total) by mouth daily.  . fluticasone (FLONASE) 50 MCG/ACT nasal spray Place 2 sprays into both nostrils 2 (two) times daily.  Marland Kitchen gabapentin (NEURONTIN) 100 MG capsule TAKE 2 CAPSULES BY MOUTH AT BEDTIME  . irbesartan-hydrochlorothiazide (AVALIDE) 150-12.5 MG tablet TAKE 1 TABLET BY MOUTH EVERY DAY  . meloxicam (MOBIC) 15 MG tablet Take 1 tablet (15 mg total) by mouth daily.  . montelukast (SINGULAIR) 10 MG tablet Take 1 tablet (10 mg total) by mouth at bedtime.  . NONFORMULARY OR COMPOUNDED ITEM Estradiol 0.02 % 28ml prefilled applicator Sig: apply twice a week  . pantoprazole (PROTONIX) 40 MG tablet Take 1 tablet (40 mg total) by mouth daily.  . QUEtiapine (SEROQUEL) 100 MG tablet Take 1 tablet (100 mg total) by mouth at bedtime.  . ranitidine (ZANTAC) 150 MG tablet Take 1 tablet (150 mg total) by mouth 2 (two) times daily.  Marland Kitchen tiotropium (SPIRIVA HANDIHALER) 18 MCG inhalation capsule Place 1 capsule (18 mcg total) into inhaler and inhale daily.  Marland Kitchen tiZANidine (ZANAFLEX) 4 MG  tablet Take 1 tablet (4 mg total) by mouth every 6 (six) hours as needed for muscle spasms.  . traMADol (ULTRAM) 50 MG tablet Take 1 tablet (50 mg total) by mouth every 6 (  six) hours as needed. For pain.   Facility-Administered Encounter Medications as of 10/16/2016  Medication  . ipratropium-albuterol (DUONEB) 0.5-2.5 (3) MG/3ML nebulizer solution 3 mL  . omalizumab Arvid Right) injection 300 mg

## 2016-10-17 ENCOUNTER — Ambulatory Visit: Payer: Self-pay | Admitting: Internal Medicine

## 2016-10-17 NOTE — Progress Notes (Signed)
[name] [address], [place] [address] Phone: [phone] Subjective:    I'm seeing this patient by the request  of:  Cathlean Cower, MD   CC: bilateral hip pain Follow-up  [id number]  [name] is a 55 y.o. female coming in with complaint of bilateral hip pain. Patient states that the pain is on the lateral aspect of the hips.   Patient was last seen 4 months ago and was concern for a lumbar radiculopathy but as well as a greater trochanteric bursitis. Was given bilateral injections 5 months ago. Patient states She is having pain that seems to be from her hips on down to her feet. States that it is more of a cramping sensation. Since then is starting to affect daily activities. Waking her up at night. Affecting some daily activities. Feels that she's has a soreness at all times. Denies any bowel or bladder incontinence, denies any fevers chills any abnormal weight loss. Denies any back pain that is truly associated with it but does have some discomfort. Denies any urinary incontinence.  Past Medical History:  Diagnosis Date  . ALLERGIC RHINITIS 05/12/2009   Qualifier: Diagnosis of  By: Lenn Cal Deborra Medina), Wynona Canes    . Anemia, iron deficiency 12/22/2014  . Angina   . Anxiety   . Arthritis   . Asthma   . ASTHMA 05/12/2009   Severe AFL (Spirometry 05/2009: pre-BD FEV1 0.87L 34% pred, post-BD FEV1 1.11L 44% pred) Volumes hyperinflated Decreased DLCO that does not fully correct to normal range for alveolar volume.     Marland Kitchen COPD 08/24/2009   Qualifier: Diagnosis of  By: Burnett Kanaris    . COPD (chronic obstructive pulmonary disease) ([place])   . Depression   . Fibromyalgia 05/14/2014  . GERD (gastroesophageal reflux disease)   . Hypertension   . HYPERTENSION 05/12/2009   Qualifier: Diagnosis of  By: Lenn Cal Deborra Medina), Wynona Canes    . Migraine   . Peripheral vascular disease ([place])   . Prediabetes 02/23/2014  . Urticaria    Past Surgical History:    Procedure Laterality Date  . ABDOMINAL HYSTERECTOMY    . BACK SURGERY    . COLONOSCOPY  12/20/2011   Procedure: COLONOSCOPY;  Surgeon: Landry Dyke, MD;  Location: [facility];  Service: Endoscopy;  Laterality: N/A;  . COLONOSCOPY  03/05/2012   Procedure: COLONOSCOPY;  Surgeon: Landry Dyke, MD;  Location: [facility];  Service: Endoscopy;  Laterality: N/A;  . DIAGNOSTIC LAPAROSCOPY    . HEMORRHOID SURGERY    . INCISE AND DRAIN ABCESS    . KIDNEY STONE SURGERY    . NECK SURGERY    . TOE SURGERY    . TUBAL LIGATION     Social History  Substance Use Topics  . Smoking status: Current Some Day Smoker    Packs/day: 0.50    Years: 36.00    Types: Cigarettes  . Smokeless tobacco: Never Used     Comment: quit 02/2016 but started back --Pt states she is "an off and on smoker."    . Alcohol use No     Comment: drinks red wine occasionally   Allergies  Allergen Reactions  . Sulfa Antibiotics Nausea And Vomiting  . Sulfonamide Derivatives Nausea And Vomiting    REACTION: n/v   Social History  Substance Use Topics  . Smoking status: Current Some Day Smoker    Packs/day: 0.50    Years: 36.00    Types: Cigarettes  . Smokeless  tobacco: Never Used     Comment: quit 02/2016 but started back --Pt states she is "an off and on smoker."    . Alcohol use No     Comment: drinks red wine occasionally   Family History  Problem Relation Age of Onset  . Diabetes Mother   . COPD Mother   . Heart disease Mother   . Asthma Mother   . Diabetes Father   . Kidney disease Father   . Cancer Father   . Anesthesia problems Father   . Alcohol abuse Father   . Diabetes Sister   . Hypertension Sister   . Diabetes Brother   . Sleep apnea Brother   . Asthma Brother   . Alcohol abuse Brother   . Heart disease Sister   . Diabetes Sister   . Heart disease Brother   . Asthma Sister   . Allergic rhinitis Neg Hx   . Eczema Neg Hx   . Urticaria Neg Hx         Past medical history,  social, surgical and family history all reviewed in electronic medical record.   Review of Systems: No headache, visual changes, nausea, vomiting, diarrhea, constipation, dizziness, abdominal pain, skin rash, fevers, chills, night sweats, weight loss, swollen lymph nodes, chest pain, shortness of breath, mood changes.   Objective  Blood pressure 124/84, pulse 80, height 5\' 3"  (1.6 m), weight 170 lb (77.1 kg), SpO2 99 %.  Systems examined below as of 10/18/16 General: NAD A&O x3 mood, affect normal  HEENT: Pupils equal, extraocular movements intact no nystagmus Respiratory: not short of breath at rest or with speaking Cardiovascular: No lower extremity edema, non tender Skin: Warm dry intact with no signs of infection or rash on extremities or on axial skeleton. Abdomen: Soft nontender, no masses Neuro: Cranial nerves  intact, neurovascularly intact in all extremities with 2+ DTRs and 2+ pulses. Lymph: No lymphadenopathy appreciated today  Gait normal with good balance and coordination.  MSK: Non tender with full range of motion and good stability and symmetric strength and tone of shoulders, elbows, wrist,  knee hips and ankles bilaterally.   Mild osteoarthritic changes of multiple joints significant healing tender and multiple different joints.  Back Exam:  Inspection: Unremarkable  Motion: Flexion 45 deg, Extension 15 deg, Side Bending to 45 deg bilaterally,  Rotation to 45 deg bilaterally  SLR laying: Negative bilaterally which is an improvement XSLR laying: Negative  Palpable tenderness: Continued discomfort.Marland Kitchen FABER: Positive bilateral. Sensory change: Gross sensation intact to all lumbar and sacral dermatomes. Patient though does have some pain with palpation of the musculature of the lower legs bilaterally Reflexes: 2+ at both patellar tendons, 2+ at achilles tendons, Babinski's downgoing.  Strength at foot  Plantar-flexion: 5/5 Dorsi-flexion: 5/5 Eversion: 5/5 Inversion: 5/5   Leg strength  Quad: 5/5 Hamstring: 5/5 Hip flexor: 5/5 Hip abductors: 5/5   [id number] ROM IR: 15 DegWith pain with internal rotation which is new., ER: 35 Deg, Flexion: 100 Deg, Extension: 80 Deg, Abduction: 45 Deg, Adduction: 45 Deg Strength IR: /5, ER: 5/5, Flexion: 5/5, Extension: 5/5, Abduction: 5/5, Adduction: 5/5 Pelvic alignment unremarkable to inspection and palpation. Standing hip rotation and gait without trendelenburg sign / unsteadiness. Continued tenderness to palpation over the paraspinal musculature as well as the greater enteric area. Patient has diffuse tenderness of the musculature of the lower extra knees. Mild SI joint tenderness with SI movement. Negative straight leg test bilaterally Mild worsening from previous exam   Impression  and Recommendations:     This case required medical decision making of moderate complexity.

## 2016-10-18 ENCOUNTER — Ambulatory Visit (INDEPENDENT_AMBULATORY_CARE_PROVIDER_SITE_OTHER): Payer: Medicare Other | Admitting: Family Medicine

## 2016-10-18 ENCOUNTER — Encounter: Payer: Self-pay | Admitting: Family Medicine

## 2016-10-18 VITALS — BP 124/84 | HR 80 | Ht 63.0 in | Wt 170.0 lb

## 2016-10-18 DIAGNOSIS — D508 Other iron deficiency anemias: Secondary | ICD-10-CM

## 2016-10-18 DIAGNOSIS — R5383 Other fatigue: Secondary | ICD-10-CM | POA: Diagnosis not present

## 2016-10-18 DIAGNOSIS — M25552 Pain in left hip: Secondary | ICD-10-CM

## 2016-10-18 DIAGNOSIS — M25551 Pain in right hip: Secondary | ICD-10-CM | POA: Diagnosis not present

## 2016-10-18 DIAGNOSIS — M5441 Lumbago with sciatica, right side: Secondary | ICD-10-CM | POA: Diagnosis not present

## 2016-10-18 DIAGNOSIS — G8929 Other chronic pain: Secondary | ICD-10-CM

## 2016-10-18 MED ORDER — FERRALET 90 90-1 MG PO TABS: 1.0000 | ORAL_TABLET | Freq: Every day | ORAL | 1 refills | Status: DC

## 2016-10-18 NOTE — Assessment & Plan Note (Addendum)
Prescription given today. 

## 2016-10-18 NOTE — Assessment & Plan Note (Signed)
Patient is having bilateral hip pain. Discussed with patient at great length. We did discuss the possibility of restarting the gabapentin which she has been noncompliant with. In addition of this patient also is having history of iron deficiency. Patient stopped taking iron for approximately last 3-6 weeks and states that that seems to be when this is gotten significantly worse. Past pedicle history significant for pop fibromyalgia as well as bipolar though some different medications will need to be very balancing night. Discussed trying these easing interventions before we workup patient's back. Spent  25 minutes with patient face-to-face and had greater than 50% of counseling including as described above in assessment and plan.

## 2016-10-18 NOTE — Assessment & Plan Note (Signed)
Likely iron deficiency

## 2016-10-18 NOTE — Patient Instructions (Signed)
Good see you  Ferralate daily with 500mg  of vitamin C Gabapentin 200mg  at night I really believe this should do the trick  If not better in 3 weeks see me again .  We will need to look at your back then.

## 2016-10-24 ENCOUNTER — Ambulatory Visit (INDEPENDENT_AMBULATORY_CARE_PROVIDER_SITE_OTHER): Payer: Medicare Other | Admitting: Psychiatry

## 2016-10-24 ENCOUNTER — Encounter (HOSPITAL_COMMUNITY): Payer: Self-pay | Admitting: Psychiatry

## 2016-10-24 DIAGNOSIS — Z811 Family history of alcohol abuse and dependence: Secondary | ICD-10-CM | POA: Diagnosis not present

## 2016-10-24 DIAGNOSIS — Z79899 Other long term (current) drug therapy: Secondary | ICD-10-CM

## 2016-10-24 DIAGNOSIS — Z818 Family history of other mental and behavioral disorders: Secondary | ICD-10-CM | POA: Diagnosis not present

## 2016-10-24 DIAGNOSIS — F3132 Bipolar disorder, current episode depressed, moderate: Secondary | ICD-10-CM | POA: Diagnosis not present

## 2016-10-24 MED ORDER — QUETIAPINE FUMARATE 100 MG PO TABS: 100.0000 mg | ORAL_TABLET | Freq: Every day | ORAL | 2 refills | Status: DC

## 2016-10-24 MED ORDER — DIVALPROEX SODIUM ER 500 MG PO TB24: 500.0000 mg | ORAL_TABLET | Freq: Every day | ORAL | 2 refills | Status: DC
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
## 2016-10-26 ENCOUNTER — Ambulatory Visit (INDEPENDENT_AMBULATORY_CARE_PROVIDER_SITE_OTHER): Payer: Medicare Other

## 2016-10-26 DIAGNOSIS — L501 Idiopathic urticaria: Secondary | ICD-10-CM | POA: Diagnosis not present

## 2016-11-16 ENCOUNTER — Encounter: Payer: Self-pay | Admitting: Emergency Medicine

## 2016-11-16 ENCOUNTER — Ambulatory Visit (INDEPENDENT_AMBULATORY_CARE_PROVIDER_SITE_OTHER): Payer: Medicare Other | Admitting: Emergency Medicine

## 2016-11-16 DIAGNOSIS — J301 Allergic rhinitis due to pollen: Secondary | ICD-10-CM

## 2016-11-16 DIAGNOSIS — F17200 Nicotine dependence, unspecified, uncomplicated: Secondary | ICD-10-CM

## 2016-11-16 DIAGNOSIS — J449 Chronic obstructive pulmonary disease, unspecified: Secondary | ICD-10-CM | POA: Diagnosis not present

## 2016-11-16 DIAGNOSIS — M5441 Lumbago with sciatica, right side: Secondary | ICD-10-CM | POA: Diagnosis not present

## 2016-11-16 DIAGNOSIS — G8929 Other chronic pain: Secondary | ICD-10-CM

## 2016-11-16 NOTE — Assessment & Plan Note (Signed)
Stable to improved on her current regimen. I believe that decreasing her cigarettes and improved management of her allergic rhinitis has made her COPD easier to manage. Flu shot up to date.

## 2016-11-16 NOTE — Assessment & Plan Note (Signed)
  New left Flank and upper back pain for the last 5 days. Based on description and physical exam suspect that this is a muscle strain, likely due to her chronic cough. Recommended symptomatically relief and cough suppression. His continues then we may need workup other possible etiologies further.
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
## 2016-11-16 NOTE — Patient Instructions (Addendum)
Please continue your current medications as you have been taking them  CONGRATULATIONS on decreasing your smoking! Continue your nicotine lozenges 2x a day. Work hard on cutting the cigarettes down to zero.  Use tylenol, ibuprofen and dry heat on your back for the muscle strain Try using delsym as directed for cough suppression - this will help your back recover more quickly.  Follow with Dr Lamonte Sakai in 6 months or sooner if you have any problems

## 2016-11-16 NOTE — Assessment & Plan Note (Signed)
Improved on current regimen 

## 2016-11-16 NOTE — Assessment & Plan Note (Signed)
Discussed cessation with her in detail today 

## 2016-11-27 ENCOUNTER — Ambulatory Visit (INDEPENDENT_AMBULATORY_CARE_PROVIDER_SITE_OTHER): Payer: Medicare Other | Admitting: Clinical

## 2016-11-27 DIAGNOSIS — F4310 Post-traumatic stress disorder, unspecified: Secondary | ICD-10-CM

## 2016-11-27 DIAGNOSIS — F250 Schizoaffective disorder, bipolar type: Secondary | ICD-10-CM

## 2016-11-27 NOTE — Progress Notes (Signed)
   THERAPIST PROGRESS NOTE  Session Time: 3:30 - 4:25  Participation Level: Active  Behavioral Response: CasualAlertDepressed  Type of Therapy: Individual Therapy  Treatment Goals addressed: improve psychiatric symptoms, more balanced mood (anger and stress management), improve unhelpful thought patterns, controlled behavior, moderated mood, and more deliberative speech and thought process (improved social functioning),   healthy coping skills  Interventions: CBT and Motivational Interviewing    Summary: JUNETTE BERNAT is a 55 y.o. female who presents with Schizoaffective disorder, bipolar type, post-traumatic stress disorder  Suicidal/Homicidal: No without intent/plan  Therapist Response: Leda Gauze met with clinician for an individual session. Jerrell shared about her psychiatric symptoms, her current life events and her homework. Lillieanna shared that she has been very upset lately. Clinician asked open ended questions and Kayleanna shared that angry due to certain family situations. She shared about the situations and her thought process. She shared that she has been very angry lately. Clinician asked open ended questions and Senia shared her negative thoughts, and the evidence for and against the thoughts. Asalee was then able to formulate healthier alternative thoughts. Client and clinician discussed what her actions might be when she thinks the original negative automatic thoughts verses when she thinks the healthier alternative thoughts. Lira also shared that she has been writing her obituary. Clinician asked if she was suicidal or thought she might die soon. Lee-Anne denied both. She shared that it made her feel good to reflect on her life and what she would like others to know about her life.   Plan: Return again in 1-2 weeks.  Diagnosis: Axis I: Schizoaffective disorder, bipolar type, post-traumatic stress disorder     Nekoda Chock A, LCSW 11/27/2016

## 2016-11-30 ENCOUNTER — Telehealth: Payer: Self-pay | Admitting: Emergency Medicine

## 2016-11-30 NOTE — Telephone Encounter (Signed)
Called and spoke to pt. Pt c/o worsening prod cough with yellow mucus (producing mucus in morning and dry cough at night) and chest tightness that has worsened since seeing RB on 12.1.17. Pt states the mucinex and Delsym arent working. Pt denies SOB and f/c/s.   Dr. Lamonte Sakai please advise. Thanks.

## 2016-11-30 NOTE — Telephone Encounter (Signed)
Please have her take pred taper > Take 40mg  daily for 3 days, then 30mg  daily for 3 days, then 20mg  daily for 3 days, then 10mg  daily for 3 days, then stop Azithromycin Z pack

## 2016-11-30 NOTE — Telephone Encounter (Signed)
Lmtcb.

## 2016-12-03 ENCOUNTER — Encounter (HOSPITAL_COMMUNITY): Payer: Self-pay | Admitting: Clinical

## 2016-12-03 ENCOUNTER — Other Ambulatory Visit (HOSPITAL_COMMUNITY): Payer: Self-pay | Admitting: Psychiatry

## 2016-12-03 DIAGNOSIS — F3132 Bipolar disorder, current episode depressed, moderate: Secondary | ICD-10-CM

## 2016-12-03 NOTE — Telephone Encounter (Signed)
lmtcb for pt.  

## 2016-12-04 ENCOUNTER — Ambulatory Visit (INDEPENDENT_AMBULATORY_CARE_PROVIDER_SITE_OTHER): Payer: Medicare Other

## 2016-12-04 DIAGNOSIS — L501 Idiopathic urticaria: Secondary | ICD-10-CM | POA: Diagnosis not present

## 2016-12-04 DIAGNOSIS — L700 Acne vulgaris: Secondary | ICD-10-CM | POA: Diagnosis not present

## 2016-12-04 MED ORDER — AZITHROMYCIN 250 MG PO TABS: ORAL_TABLET | ORAL | 0 refills | Status: DC

## 2016-12-04 MED ORDER — PREDNISONE 10 MG PO TABS: ORAL_TABLET | ORAL | 0 refills | Status: DC

## 2016-12-04 NOTE — Telephone Encounter (Signed)
Called and spoke with pt and she is aware of meds per RB.  These have been sent to her pharmacy and pt is aware

## 2016-12-25 ENCOUNTER — Telehealth: Payer: Self-pay | Admitting: Allergy & Immunology

## 2016-12-25 NOTE — Telephone Encounter (Signed)
[facility] called to let you know that the sterile water for Xolair is on backorder. They would like a call back today to let them know if they should go ahead and send the Xolair, for [place], without the sterile water. If they do not receive a call back today they will send it without the sterile water. [phone]

## 2016-12-25 NOTE — Telephone Encounter (Signed)
Will let them go ahead and send since I dont have any info to call [facility] today

## 2016-12-27 ENCOUNTER — Ambulatory Visit (HOSPITAL_COMMUNITY): Payer: Self-pay | Admitting: Clinical

## 2017-01-08 ENCOUNTER — Ambulatory Visit: Payer: Self-pay

## 2017-01-11 ENCOUNTER — Telehealth: Payer: Self-pay | Admitting: Internal Medicine

## 2017-01-11 DIAGNOSIS — H5710 Ocular pain, unspecified eye: Secondary | ICD-10-CM

## 2017-01-11 DIAGNOSIS — G4489 Other headache syndrome: Secondary | ICD-10-CM

## 2017-01-11 NOTE — Telephone Encounter (Signed)
Pt request referral to go to eye doctor due to headache and eye problem. Please advise.

## 2017-01-11 NOTE — Addendum Note (Signed)
Addended by: Biagio Borg on: 01/11/2017 12:45 PM   Modules accepted: Orders

## 2017-01-11 NOTE — Telephone Encounter (Signed)
Pt aware.

## 2017-01-11 NOTE — Telephone Encounter (Signed)
Ok, this is done 

## 2017-01-16 ENCOUNTER — Ambulatory Visit (HOSPITAL_COMMUNITY): Payer: Self-pay | Admitting: Clinical

## 2017-01-24 ENCOUNTER — Ambulatory Visit (HOSPITAL_COMMUNITY): Payer: Self-pay | Admitting: Psychiatry

## 2017-02-05 ENCOUNTER — Ambulatory Visit (INDEPENDENT_AMBULATORY_CARE_PROVIDER_SITE_OTHER): Payer: Medicare Other

## 2017-02-05 DIAGNOSIS — J4540 Moderate persistent asthma, uncomplicated: Secondary | ICD-10-CM

## 2017-02-15 ENCOUNTER — Ambulatory Visit: Payer: Self-pay | Admitting: Emergency Medicine

## 2017-03-05 ENCOUNTER — Ambulatory Visit (INDEPENDENT_AMBULATORY_CARE_PROVIDER_SITE_OTHER): Payer: Medicare Other | Admitting: *Deleted

## 2017-03-05 DIAGNOSIS — J4550 Severe persistent asthma, uncomplicated: Secondary | ICD-10-CM

## 2017-03-05 DIAGNOSIS — H524 Presbyopia: Secondary | ICD-10-CM | POA: Diagnosis not present

## 2017-03-05 DIAGNOSIS — H52201 Unspecified astigmatism, right eye: Secondary | ICD-10-CM | POA: Diagnosis not present

## 2017-03-05 DIAGNOSIS — H5713 Ocular pain, bilateral: Secondary | ICD-10-CM | POA: Diagnosis not present

## 2017-03-05 DIAGNOSIS — H5203 Hypermetropia, bilateral: Secondary | ICD-10-CM | POA: Diagnosis not present

## 2017-03-20 ENCOUNTER — Other Ambulatory Visit: Payer: Self-pay | Admitting: Internal Medicine

## 2017-03-20 ENCOUNTER — Other Ambulatory Visit: Payer: Self-pay | Admitting: Family Medicine

## 2017-04-02 ENCOUNTER — Ambulatory Visit (INDEPENDENT_AMBULATORY_CARE_PROVIDER_SITE_OTHER): Payer: Medicare Other | Admitting: *Deleted

## 2017-04-02 DIAGNOSIS — L500 Allergic urticaria: Secondary | ICD-10-CM | POA: Diagnosis not present

## 2017-04-19 ENCOUNTER — Encounter: Payer: Self-pay | Admitting: Internal Medicine

## 2017-04-19 ENCOUNTER — Ambulatory Visit (INDEPENDENT_AMBULATORY_CARE_PROVIDER_SITE_OTHER): Payer: Medicare Other | Admitting: Internal Medicine

## 2017-04-19 ENCOUNTER — Other Ambulatory Visit (INDEPENDENT_AMBULATORY_CARE_PROVIDER_SITE_OTHER): Payer: Medicare Other

## 2017-04-19 DIAGNOSIS — J449 Chronic obstructive pulmonary disease, unspecified: Secondary | ICD-10-CM | POA: Diagnosis not present

## 2017-04-19 DIAGNOSIS — R600 Localized edema: Secondary | ICD-10-CM

## 2017-04-19 DIAGNOSIS — I10 Essential (primary) hypertension: Secondary | ICD-10-CM

## 2017-04-19 LAB — URINALYSIS, ROUTINE W REFLEX MICROSCOPIC
Ketones, ur: NEGATIVE
Leukocytes, UA: NEGATIVE
Nitrite: NEGATIVE
Specific Gravity, Urine: 1.025 (ref 1.000–1.030)
Total Protein, Urine: NEGATIVE
Urine Glucose: NEGATIVE
Urobilinogen, UA: 0.2 (ref 0.0–1.0)
pH: 6 (ref 5.0–8.0)

## 2017-04-19 LAB — BASIC METABOLIC PANEL
BUN: 12 mg/dL (ref 6–23)
CO2: 32 mEq/L (ref 19–32)
Calcium: 9.8 mg/dL (ref 8.4–10.5)
Chloride: 106 mEq/L (ref 96–112)
Creatinine, Ser: 0.82 mg/dL (ref 0.40–1.20)
GFR: 93.31 mL/min (ref >60.00)
Glucose, Bld: 89 mg/dL (ref 70–99)
Potassium: 3.8 mEq/L (ref 3.5–5.1)
Sodium: 144 mEq/L (ref 135–145)

## 2017-04-19 LAB — CBC WITH DIFFERENTIAL/PLATELET
Basophils Absolute: 0.2 10*3/uL — ABNORMAL HIGH (ref 0.0–0.1)
Basophils Relative: 1.8 % (ref 0.0–3.0)
Eosinophils Absolute: 0.2 10*3/uL (ref 0.0–0.7)
Eosinophils Relative: 2.1 % (ref 0.0–5.0)
HCT: 38 % (ref 36.0–46.0)
Hemoglobin: 12.7 g/dL (ref 12.0–15.0)
Lymphocytes Relative: 35.4 % (ref 12.0–46.0)
Lymphs Abs: 3.8 10*3/uL (ref 0.7–4.0)
MCHC: 33.5 g/dL (ref 30.0–36.0)
MCV: 91 fl (ref 78.0–100.0)
Monocytes Absolute: 0.7 10*3/uL (ref 0.1–1.0)
Monocytes Relative: 6.5 % (ref 3.0–12.0)
Neutro Abs: 5.8 10*3/uL (ref 1.4–7.7)
Neutrophils Relative %: 54.2 % (ref 43.0–77.0)
Platelets: 318 10*3/uL (ref 150.0–400.0)
RBC: 4.18 Mil/uL (ref 3.87–5.11)
RDW: 14.3 % (ref 11.5–15.5)
WBC: 10.7 10*3/uL — ABNORMAL HIGH (ref 4.0–10.5)

## 2017-04-19 LAB — HEPATIC FUNCTION PANEL
ALT: 15 U/L (ref 0–35)
AST: 16 U/L (ref 0–37)
Albumin: 4.3 g/dL (ref 3.5–5.2)
Alkaline Phosphatase: 90 U/L (ref 39–117)
Bilirubin, Direct: 0.1 mg/dL (ref 0.0–0.3)
Total Bilirubin: 0.4 mg/dL (ref 0.2–1.2)
Total Protein: 7.1 g/dL (ref 6.0–8.3)

## 2017-04-19 LAB — LIPID PANEL
Cholesterol: 199 mg/dL (ref 0–200)
HDL: 59.2 mg/dL (ref >39.00)
LDL Cholesterol: 126 mg/dL — ABNORMAL HIGH (ref 0–99)
NonHDL: 139.57
Total CHOL/HDL Ratio: 3
Triglycerides: 69 mg/dL (ref 0.0–149.0)
VLDL: 13.8 mg/dL (ref 0.0–40.0)

## 2017-04-19 LAB — BRAIN NATRIURETIC PEPTIDE: Pro B Natriuretic peptide (BNP): 15 pg/mL (ref 0.0–100.0)

## 2017-04-19 LAB — TSH: TSH: 0.95 u[IU]/mL (ref 0.35–4.50)

## 2017-04-19 MED ORDER — HYDROCHLOROTHIAZIDE 12.5 MG PO CAPS: 12.5000 mg | ORAL_CAPSULE | Freq: Every day | ORAL | 11 refills | Status: DC

## 2017-04-19 NOTE — Assessment & Plan Note (Signed)
New onset x 1-2 wks, with differential including pulm HTN related to copd, vs diast chf vs other - ecg reviewed, also for echo, and also labs today as ordered

## 2017-04-19 NOTE — Patient Instructions (Addendum)
Your EKG was OK today  Please take all new medication as prescribed - the low dose fluid pill  Please continue all other medications as before, and refills have been done if requested.  Please have the pharmacy call with any other refills you may need.  Please continue your efforts at being more active, low cholesterol diet, and weight control.  Please keep your appointments with your specialists as you may have planned  You will be contacted regarding the referral for: Echocardiogram  Please go to the LAB in the Basement (turn left off the elevator) for the tests to be done today  You will be contacted by phone if any changes need to be made immediately.  Otherwise, you will receive a letter about your results with an explanation, but please check with MyChart first.  Please remember to sign up for MyChart if you have not done so, as this will be important to you in the future with finding out test results, communicating by private email, and scheduling acute appointments online when needed.  Please return in 3 months, or sooner if needed

## 2017-04-19 NOTE — Progress Notes (Signed)
Subjective:    Patient ID: [name], female    DOB: May 11, 1961, 56 y.o.   MRN: [id number]  HPI  Here to f/u, c/o worsening feet swelling for 1 wk, persistent though she has tried to elevate the legs while sitting, does not seem to go away at night.  Pt denies chest pain, increased sob or doe, wheezing, orthopnea, PND, increased LE swelling, palpitations, dizziness or syncope. Pt denies new neurological symptoms such as new headache, or facial or extremity weakness or numbness   Pt denies polydipsia, polyuria, or low sugar symptoms such as weakness or confusion improved with po intake.  Pt states overall good compliance with meds, trying to follow lower cholesterol, diabetic diet, wt overall stable but little exercise however.  Has hx of copd but no known pulm HTN  C/o bilat hip pain with known bilat bursitis and plans to f/u with Dr Tamala Julian. Also with some urinary freq over the last few days, not sure if significant, as Denies urinary symptoms such as dysuria, urgency, flank pain, hematuria or n/v, fever, chills. Past Medical History:  Diagnosis Date  . ALLERGIC RHINITIS 05/12/2009   Qualifier: Diagnosis of  By: Lenn Cal Deborra Medina), Wynona Canes    . Anemia, iron deficiency 12/22/2014  . Angina   . Anxiety   . Arthritis   . Asthma   . ASTHMA 05/12/2009   Severe AFL (Spirometry 05/2009: pre-BD FEV1 0.87L 34% pred, post-BD FEV1 1.11L 44% pred) Volumes hyperinflated Decreased DLCO that does not fully correct to normal range for alveolar volume.     Marland Kitchen COPD 08/24/2009   Qualifier: Diagnosis of  By: Burnett Kanaris    . COPD (chronic obstructive pulmonary disease) ([place])   . Depression   . Fibromyalgia 05/14/2014  . GERD (gastroesophageal reflux disease)   . Hyperlipidemia 04/20/2017  . Hypertension   . HYPERTENSION 05/12/2009   Qualifier: Diagnosis of  By: Lenn Cal Deborra Medina), Wynona Canes    . Migraine   . Peripheral vascular disease ([place])   . Prediabetes 02/23/2014  . Urticaria    Past Surgical History:    Procedure Laterality Date  . ABDOMINAL HYSTERECTOMY    . BACK SURGERY    . COLONOSCOPY  12/20/2011   Procedure: COLONOSCOPY;  Surgeon: Landry Dyke, MD;  Location: [facility];  Service: Endoscopy;  Laterality: N/A;  . COLONOSCOPY  03/05/2012   Procedure: COLONOSCOPY;  Surgeon: Landry Dyke, MD;  Location: [facility];  Service: Endoscopy;  Laterality: N/A;  . DIAGNOSTIC LAPAROSCOPY    . HEMORRHOID SURGERY    . INCISE AND DRAIN ABCESS    . KIDNEY STONE SURGERY    . NECK SURGERY    . TOE SURGERY    . TUBAL LIGATION      reports that she has been smoking Cigarettes.  She has a 18.00 pack-year smoking history. She has never used smokeless tobacco. She reports that she uses drugs, including Marijuana. She reports that she does not drink alcohol. family history includes Alcohol abuse in her brother and father; Anesthesia problems in her father; Asthma in her brother, mother, and sister; COPD in her mother; Cancer in her father; Diabetes in her brother, father, mother, sister, and sister; Heart disease in her brother, mother, and sister; Hypertension in her sister; Kidney disease in her father; Sleep apnea in her brother. Allergies  Allergen Reactions  . Sulfa Antibiotics Nausea And Vomiting  . Sulfonamide Derivatives Nausea And Vomiting    REACTION: n/v   Current Outpatient Prescriptions on File Prior  to Visit  Medication Sig Dispense Refill  . albuterol (PROVENTIL) (2.5 MG/3ML) 0.083% nebulizer solution Take 2.5 mg by nebulization every 4 (four) hours as needed for wheezing or shortness of breath. Reported on 06/04/2016    . albuterol (VENTOLIN HFA) 108 (90 Base) MCG/ACT inhaler One to two puffs every 4 to 6 hours as needed 1 Inhaler 5  . aspirin 325 MG tablet Take 325 mg by mouth daily.    Marland Kitchen azelastine (ASTELIN) 0.1 % nasal spray USE 2 SPRAYS IN EACH NOSTRIL TWICE DAILY AS DIRECTED 30 mL 5  . budesonide-formoterol (SYMBICORT) 160-4.5 MCG/ACT inhaler Inhale 2 puffs into the lungs 2  (two) times daily. 1 Inhaler 5  . cetirizine (ZYRTEC) 10 MG tablet Take 1 tablet (10 mg total) by mouth daily. 30 tablet 5  . cholecalciferol (VITAMIN D) 1000 units tablet Take 1,000 Units by mouth daily.    . divalproex (DEPAKOTE ER) 500 MG 24 hr tablet Take 1 tablet (500 mg total) by mouth daily. 30 tablet 2  . Fe Cbn-Fe Gluc-FA-B12-C-DSS (FERRALET 90) 90-1 MG TABS Take 1 tablet by mouth daily. 90 each 1  . fluticasone (FLONASE) 50 MCG/ACT nasal spray Place 2 sprays into both nostrils daily. 16 g 5  . gabapentin (NEURONTIN) 100 MG capsule TAKE 2 CAPSULES BY MOUTH AT BEDTIME 180 capsule 1  . irbesartan-hydrochlorothiazide (AVALIDE) 150-12.5 MG tablet TAKE 1 TABLET BY MOUTH EVERY DAY 90 tablet 1  . meloxicam (MOBIC) 15 MG tablet Take 1 tablet (15 mg total) by mouth daily. 90 tablet 3  . montelukast (SINGULAIR) 10 MG tablet TAKE 1 TABLET(10 MG) BY MOUTH AT BEDTIME 90 tablet 0  . NONFORMULARY OR COMPOUNDED ITEM Estradiol 0.02 % 41ml prefilled applicator Sig: apply twice a week 24 each 4  . pantoprazole (PROTONIX) 40 MG tablet Take 1 tablet (40 mg total) by mouth daily. 30 tablet 5  . QUEtiapine (SEROQUEL) 100 MG tablet Take 1 tablet (100 mg total) by mouth at bedtime. 30 tablet 2  . ranitidine (ZANTAC) 150 MG tablet Take 1 tablet (150 mg total) by mouth 2 (two) times daily. 60 tablet 5  . tiotropium (SPIRIVA HANDIHALER) 18 MCG inhalation capsule Place 1 capsule (18 mcg total) into inhaler and inhale daily. 30 capsule 5  . tiZANidine (ZANAFLEX) 4 MG tablet TAKE 1 TABLET(4 MG) BY MOUTH EVERY 6 HOURS AS NEEDED FOR MUSCLE SPASMS 360 tablet 0  . traMADol (ULTRAM) 50 MG tablet Take 1 tablet (50 mg total) by mouth every 6 (six) hours as needed. For pain. 30 tablet 3  . triamcinolone cream (KENALOG) 0.1 % APPLY EXTERNALLY TO THE AFFECTED AREA TWICE DAILY 30 g 0   Current Facility-Administered Medications on File Prior to Visit  Medication Dose Route Frequency Provider Last Rate Last Dose  .  ipratropium-albuterol (DUONEB) 0.5-2.5 (3) MG/3ML nebulizer solution 3 mL  3 mL Nebulization Once Bobbitt, [place] Muta, MD      . omalizumab Arvid Right) injection 300 mg  300 mg Subcutaneous Q28 days Valentina Shaggy, MD   300 mg at 04/02/17 1207   Review of Systems  Constitutional: Negative for other unusual diaphoresis or sweats HENT: Negative for ear discharge or swelling Eyes: Negative for other worsening visual disturbances Respiratory: Negative for stridor or other swelling  Gastrointestinal: Negative for worsening distension or other blood Genitourinary: Negative for retention or other urinary change Musculoskeletal: Negative for other MSK pain or swelling Skin: Negative for color change or other new lesions Neurological: Negative for worsening tremors and other  numbness  Psychiatric/Behavioral: Negative for worsening agitation or other fatigue All other system neg per pt    Objective:   Physical Exam BP 116/76   Pulse 85   Ht 5\' 3"  (1.6 m)   Wt 177 lb (80.3 kg)   SpO2 99%   BMI 31.35 kg/m  VS noted,  Constitutional: Pt appears in NAD HENT: Head: NCAT.  Right Ear: External ear normal.  Left Ear: External ear normal.  Eyes: . Pupils are equal, round, and reactive to light. Conjunctivae and EOM are normal Nose: without d/c or deformity Neck: Neck supple. Gross normal ROM Cardiovascular: Normal rate and regular rhythm.   Pulmonary/Chest: Effort normal and breath sounds without rales or wheezing. , decreased somewhat bilat Abd:  Soft, NT, ND, + BS, no organomegaly Neurological: Pt is alert. At baseline orientation, motor grossly intact Skin: Skin is warm. No rashes, other new lesions, with bilat trace to 1+ pedal edema Psychiatric: Pt behavior is normal without agitation  No other exam findings  ECG today I have personally interpreted Sinus  Rhythm  - WNL    Assessment & Plan:

## 2017-04-19 NOTE — Progress Notes (Signed)
Pre visit review using our clinic review tool, if applicable. No additional management support is needed unless otherwise documented below in the visit note. 

## 2017-04-20 ENCOUNTER — Encounter: Payer: Self-pay | Admitting: Internal Medicine

## 2017-04-20 ENCOUNTER — Other Ambulatory Visit: Payer: Self-pay | Admitting: Internal Medicine

## 2017-04-20 DIAGNOSIS — E785 Hyperlipidemia, unspecified: Secondary | ICD-10-CM

## 2017-04-20 HISTORY — DX: Hyperlipidemia, unspecified: E78.5

## 2017-04-20 MED ORDER — PRAVASTATIN SODIUM 40 MG PO TABS: 40.0000 mg | ORAL_TABLET | Freq: Every day | ORAL | 3 refills | Status: DC

## 2017-04-20 NOTE — Assessment & Plan Note (Signed)
stable overall by history and exam, recent data reviewed with pt, and pt to continue medical treatment as before,  to f/u any worsening symptoms or concerns BP Readings from Last 3 Encounters:  04/19/17 116/76  11/16/16 126/74  10/18/16 124/84

## 2017-04-20 NOTE — Assessment & Plan Note (Signed)
Stable,  to f/u any worsening symptoms or concerns, cont same tx

## 2017-04-23 ENCOUNTER — Telehealth: Payer: Self-pay

## 2017-04-23 NOTE — Telephone Encounter (Signed)
-----   Message from Biagio Borg, MD sent at 04/20/2017  2:01 PM EDT ----- Left message on MyChart, pt to cont same tx except  LDL cholesterol is mildly elevated; to please start pravastatin 40 mg per day to reduce risk of heart dz and stroke  [name] to please inform pt, I will do rx

## 2017-04-23 NOTE — Telephone Encounter (Signed)
Pt has been informed and expressed understanding.  

## 2017-04-30 ENCOUNTER — Ambulatory Visit (INDEPENDENT_AMBULATORY_CARE_PROVIDER_SITE_OTHER): Payer: Medicare Other

## 2017-04-30 DIAGNOSIS — L508 Other urticaria: Secondary | ICD-10-CM

## 2017-04-30 DIAGNOSIS — L501 Idiopathic urticaria: Secondary | ICD-10-CM

## 2017-05-01 ENCOUNTER — Encounter: Payer: Self-pay | Admitting: Gynecology

## 2017-05-03 ENCOUNTER — Other Ambulatory Visit: Payer: Self-pay

## 2017-05-03 ENCOUNTER — Ambulatory Visit (HOSPITAL_COMMUNITY): Payer: Medicare Other | Attending: Cardiology

## 2017-05-03 DIAGNOSIS — I5031 Acute diastolic (congestive) heart failure: Secondary | ICD-10-CM | POA: Insufficient documentation

## 2017-05-03 DIAGNOSIS — I10 Essential (primary) hypertension: Secondary | ICD-10-CM | POA: Diagnosis not present

## 2017-05-03 DIAGNOSIS — I110 Hypertensive heart disease with heart failure: Secondary | ICD-10-CM | POA: Insufficient documentation

## 2017-05-03 DIAGNOSIS — R600 Localized edema: Secondary | ICD-10-CM | POA: Insufficient documentation

## 2017-05-13 ENCOUNTER — Telehealth: Payer: Self-pay | Admitting: Internal Medicine

## 2017-05-13 MED ORDER — AZITHROMYCIN 250 MG PO TABS: ORAL_TABLET | ORAL | 0 refills | Status: DC

## 2017-05-13 NOTE — Telephone Encounter (Signed)
Chest congestion x sev days with mucus turning green , no sob p saba, no cp or fever or n/v  rx zpak, f/u by end of week prn

## 2017-05-23 ENCOUNTER — Other Ambulatory Visit: Payer: Self-pay | Admitting: Internal Medicine

## 2017-05-23 ENCOUNTER — Other Ambulatory Visit: Payer: Self-pay | Admitting: Emergency Medicine

## 2017-05-23 ENCOUNTER — Other Ambulatory Visit (HOSPITAL_COMMUNITY): Payer: Self-pay | Admitting: Psychiatry

## 2017-05-27 ENCOUNTER — Other Ambulatory Visit (HOSPITAL_COMMUNITY): Payer: Self-pay | Admitting: Psychiatry

## 2017-05-28 ENCOUNTER — Ambulatory Visit (INDEPENDENT_AMBULATORY_CARE_PROVIDER_SITE_OTHER): Payer: Medicare Other | Admitting: *Deleted

## 2017-05-28 DIAGNOSIS — J4540 Moderate persistent asthma, uncomplicated: Secondary | ICD-10-CM

## 2017-06-25 ENCOUNTER — Ambulatory Visit: Payer: Self-pay

## 2017-06-25 ENCOUNTER — Ambulatory Visit (INDEPENDENT_AMBULATORY_CARE_PROVIDER_SITE_OTHER): Payer: Medicare Other | Admitting: Allergy and Immunology

## 2017-06-25 ENCOUNTER — Encounter: Payer: Self-pay | Admitting: Allergy and Immunology

## 2017-06-25 ENCOUNTER — Other Ambulatory Visit: Payer: Self-pay | Admitting: Family Medicine

## 2017-06-25 VITALS — BP 136/78 | HR 82 | Temp 98.2°F | Resp 16 | Ht 62.0 in | Wt 175.0 lb

## 2017-06-25 DIAGNOSIS — J45901 Unspecified asthma with (acute) exacerbation: Secondary | ICD-10-CM | POA: Diagnosis not present

## 2017-06-25 DIAGNOSIS — J3089 Other allergic rhinitis: Secondary | ICD-10-CM | POA: Diagnosis not present

## 2017-06-25 DIAGNOSIS — L501 Idiopathic urticaria: Secondary | ICD-10-CM

## 2017-06-25 DIAGNOSIS — J441 Chronic obstructive pulmonary disease with (acute) exacerbation: Secondary | ICD-10-CM

## 2017-06-25 MED ORDER — UMECLIDINIUM BROMIDE 62.5 MCG/INH IN AEPB: 1.0000 | INHALATION_SPRAY | Freq: Every day | RESPIRATORY_TRACT | 5 refills | Status: DC

## 2017-06-25 MED ORDER — RANITIDINE HCL 150 MG PO TABS: 150.0000 mg | ORAL_TABLET | Freq: Two times a day (BID) | ORAL | 5 refills | Status: DC | PRN

## 2017-06-25 MED ORDER — PREDNISONE 1 MG PO TABS: 10.0000 mg | ORAL_TABLET | Freq: Every day | ORAL | Status: DC

## 2017-06-25 MED ORDER — LEVOCETIRIZINE DIHYDROCHLORIDE 5 MG PO TABS: 5.0000 mg | ORAL_TABLET | Freq: Every day | ORAL | 5 refills | Status: DC | PRN

## 2017-06-25 NOTE — Assessment & Plan Note (Signed)
   Continue appropriate allergen avoidance measures, montelukast daily, fluticasone nasal spray, and nasal saline irrigation as needed.  Levocetirizine has been prescribed (as above).

## 2017-06-25 NOTE — Assessment & Plan Note (Addendum)
   Prednisone has been provided, 20 mg x 4 days, 10 mg x1 day, then stop.  A prescription has been provided for Incruse Ellipta, 1 inhalation daily.  For now, continue omalizumab injections, Symbicort 160-4 0.5 g, 2 inhalations via spacer device twice a day, montelukast 10 mg daily bedtime, and albuterol HFA, 1-2 inhalations every 4-6 hours as needed.  The patient has been asked to contact me if her symptoms persist or progress. Otherwise, she may return for follow up in 4 months.

## 2017-06-25 NOTE — Progress Notes (Signed)
Follow-up Note  RE: [name] MRN: [id number] DOB: 1961-11-16 Date of Office Visit: 06/25/2017  Primary care provider: Biagio Borg, MD Referring provider: Biagio Borg, MD  History of present illness: [name] is a 56 y.o. female with asthma/COPD, chronic rhinitis, and chronic idiopathic urticaria/pruritus presenting today for follow up.  She was last seen in this clinic by Dr. Ernst Bowler in September 2017.  She reports that she has been itching "everywhere" over the past 3 weeks.  She had previously been doing quite well with regards urticaria/pruritus.  She is currently attempting to control the pruritus with montelukast, diphenhydramine, and over the counter hydrocortisone.  She reports that she has been experiencing lower respiratory symptoms with mild/moderate exertion.  For example, climbing a flight of stairs causes dyspnea and/or wheezing.  She denies nocturnal awakenings due to lower respiratory symptoms. She has no significant nasal symptom complaints today.   Assessment and plan: Asthma/COPD exacerbation  Prednisone has been provided, 20 mg x 4 days, 10 mg x1 day, then stop.  A prescription has been provided for Incruse Ellipta, 1 inhalation daily.  For now, continue omalizumab injections, Symbicort 160-4 0.5 g, 2 inhalations via spacer device twice a day, montelukast 10 mg daily bedtime, and albuterol HFA, 1-2 inhalations every 4-6 hours as needed.  The patient has been asked to contact me if her symptoms persist or progress. Otherwise, she may return for follow up in 4 months.  Idiopathic urticaria/pruritus  Prednisone has been provided (as above).  Instructions have been discussed and provided for H1/H2 receptor blockade with titration to find lowest effective dose.  A prescription has been provided for levocetirizine 5 mg daily as needed.  A prescription has been provided for ranitidine 150 mg twice a day as needed.  Continue montelukast 10  mg daily at bedtime.  Perennial allergic rhinitis  Continue appropriate allergen avoidance measures, montelukast daily, fluticasone nasal spray, and nasal saline irrigation as needed.  Levocetirizine has been prescribed (as above).   Meds ordered this encounter  Medications  . umeclidinium bromide (INCRUSE ELLIPTA) 62.5 MCG/INH AEPB    Sig: Inhale 1 puff into the lungs daily.    Dispense:  30 each    Refill:  5  . levocetirizine (XYZAL) 5 MG tablet    Sig: Take 1 tablet (5 mg total) by mouth daily as needed for allergies.    Dispense:  30 tablet    Refill:  5  . ranitidine (ZANTAC) 150 MG tablet    Sig: Take 1 tablet (150 mg total) by mouth 2 (two) times daily as needed for heartburn.    Dispense:  60 tablet    Refill:  5  . predniSONE (DELTASONE) tablet 10 mg    Diagnostics: Spirometry reveals an FVC of 1.27 L (52% predicted) and an FEV1 of 0.67 L (33% predicted) with 12% postbronchodilator improvement.   Please see scanned spirometry results for details.    Physical examination: Blood pressure 136/78, pulse 82, temperature 98.2 F (36.8 C), temperature source Oral, resp. rate 16, height 5\' 2"  (1.575 m), weight 175 lb (79.4 kg), SpO2 95 %.  General: Alert, interactive, in no acute distress. HEENT: TMs pearly gray, turbinates moderately edematous with thick discharge, post-pharynx mildly erythematous. Neck: Supple without lymphadenopathy. Lungs: Decreased breath sounds bilaterally without wheezing, rhonchi or rales. CV: Normal S1, S2 without murmurs. Skin: Warm and dry, without lesions or rashes.  The following portions of the patient's history were reviewed and updated as appropriate: allergies,  current medications, past family history, past medical history, past social history, past surgical history and problem list.  Allergies as of 06/25/2017      Reactions   Sulfa Antibiotics Nausea And Vomiting   Sulfonamide Derivatives Nausea And Vomiting   REACTION: n/v        Medication List       Accurate as of 06/25/17  5:32 PM. Always use your most recent med list.          albuterol (2.5 MG/3ML) 0.083% nebulizer solution Commonly known as:  PROVENTIL Take 2.5 mg by nebulization every 4 (four) hours as needed for wheezing or shortness of breath. Reported on 06/04/2016   albuterol 108 (90 Base) MCG/ACT inhaler Commonly known as:  VENTOLIN HFA One to two puffs every 4 to 6 hours as needed   aspirin 325 MG tablet Take 325 mg by mouth daily.   azelastine 0.1 % nasal spray Commonly known as:  ASTELIN USE 2 SPRAYS IN EACH NOSTRIL TWICE DAILY AS DIRECTED   budesonide-formoterol 160-4.5 MCG/ACT inhaler Commonly known as:  SYMBICORT Inhale 2 puffs into the lungs 2 (two) times daily.   cetirizine 10 MG tablet Commonly known as:  ZYRTEC Take 1 tablet (10 mg total) by mouth daily.   cholecalciferol 1000 units tablet Commonly known as:  VITAMIN D Take 1,000 Units by mouth daily.   divalproex 500 MG 24 hr tablet Commonly known as:  DEPAKOTE ER Take 1 tablet (500 mg total) by mouth daily.   FERRALET 90 90-1 MG Tabs Take 1 tablet by mouth daily.   fluticasone 50 MCG/ACT nasal spray Commonly known as:  FLONASE Place 2 sprays into both nostrils daily.   gabapentin 100 MG capsule Commonly known as:  NEURONTIN TAKE 2 CAPSULES BY MOUTH AT BEDTIME   hydrochlorothiazide 12.5 MG capsule Commonly known as:  MICROZIDE Take 1 capsule (12.5 mg total) by mouth daily.   irbesartan-hydrochlorothiazide 150-12.5 MG tablet Commonly known as:  AVALIDE TAKE 1 TABLET BY MOUTH EVERY DAY   levocetirizine 5 MG tablet Commonly known as:  XYZAL Take 1 tablet (5 mg total) by mouth daily as needed for allergies.   meloxicam 15 MG tablet Commonly known as:  MOBIC Take 1 tablet (15 mg total) by mouth daily.   montelukast 10 MG tablet Commonly known as:  SINGULAIR TAKE 1 TABLET(10 MG) BY MOUTH AT BEDTIME   NONFORMULARY OR COMPOUNDED ITEM Estradiol 0.02 % 79ml  prefilled applicator Sig: apply twice a week   pantoprazole 40 MG tablet Commonly known as:  PROTONIX TAKE 1 TABLET BY MOUTH DAILY   pravastatin 40 MG tablet Commonly known as:  PRAVACHOL Take 1 tablet (40 mg total) by mouth daily.   QUEtiapine 100 MG tablet Commonly known as:  SEROQUEL Take 1 tablet (100 mg total) by mouth at bedtime.   ranitidine 150 MG tablet Commonly known as:  ZANTAC Take 1 tablet (150 mg total) by mouth 2 (two) times daily as needed for heartburn.   tiotropium 18 MCG inhalation capsule Commonly known as:  SPIRIVA HANDIHALER Place 1 capsule (18 mcg total) into inhaler and inhale daily.   tiZANidine 4 MG tablet Commonly known as:  ZANAFLEX TAKE 1 TABLET(4 MG) BY MOUTH EVERY 6 HOURS AS NEEDED FOR MUSCLE SPASMS   traMADol 50 MG tablet Commonly known as:  ULTRAM Take 1 tablet (50 mg total) by mouth every 6 (six) hours as needed. For pain.   triamcinolone cream 0.1 % Commonly known as:  KENALOG APPLY EXTERNALLY TO  THE AFFECTED AREA TWICE DAILY   umeclidinium bromide 62.5 MCG/INH Aepb Commonly known as:  INCRUSE ELLIPTA Inhale 1 puff into the lungs daily.       Allergies  Allergen Reactions  . Sulfa Antibiotics Nausea And Vomiting  . Sulfonamide Derivatives Nausea And Vomiting    REACTION: n/v   Review of systems: Review of systems negative except as noted in HPI / PMHx or noted below: Constitutional: Negative.  HENT: Negative.   Eyes: Negative.  Respiratory: Negative.   Cardiovascular: Negative.  Gastrointestinal: Negative.  Genitourinary: Negative.  Musculoskeletal: Negative.  Neurological: Negative.  Endo/Heme/Allergies: Negative.  Cutaneous: Negative.  Past Medical History:  Diagnosis Date  . ALLERGIC RHINITIS 05/12/2009   Qualifier: Diagnosis of  By: Lenn Cal Deborra Medina), Wynona Canes    . Anemia, iron deficiency 12/22/2014  . Angina   . Anxiety   . Arthritis   . Asthma   . ASTHMA 05/12/2009   Severe AFL (Spirometry 05/2009: pre-BD FEV1  0.87L 34% pred, post-BD FEV1 1.11L 44% pred) Volumes hyperinflated Decreased DLCO that does not fully correct to normal range for alveolar volume.     Marland Kitchen COPD 08/24/2009   Qualifier: Diagnosis of  By: Burnett Kanaris    . COPD (chronic obstructive pulmonary disease) ([place])   . Depression   . Fibromyalgia 05/14/2014  . GERD (gastroesophageal reflux disease)   . Hyperlipidemia 04/20/2017  . Hypertension   . HYPERTENSION 05/12/2009   Qualifier: Diagnosis of  By: Lenn Cal Deborra Medina), Wynona Canes    . Migraine   . Peripheral vascular disease ([place])   . Prediabetes 02/23/2014  . Urticaria     Family History  Problem Relation Age of Onset  . Diabetes Mother   . COPD Mother   . Heart disease Mother   . Asthma Mother   . Diabetes Father   . Kidney disease Father   . Cancer Father   . Anesthesia problems Father   . Alcohol abuse Father   . Diabetes Sister   . Hypertension Sister   . Diabetes Brother   . Sleep apnea Brother   . Asthma Brother   . Alcohol abuse Brother   . Heart disease Sister   . Diabetes Sister   . Heart disease Brother   . Asthma Sister   . Allergic rhinitis Neg Hx   . Eczema Neg Hx   . Urticaria Neg Hx     Social History   Social History  . Marital status: Divorced    Spouse name: N/A  . Number of children: N/A  . Years of education: N/A   Occupational History  . Not on file.   Social History Main Topics  . Smoking status: Current Some Day Smoker    Packs/day: 0.50    Years: 36.00    Types: Cigarettes  . Smokeless tobacco: Never Used     Comment: quit 02/2016 but started back --Pt states she is "an off and on smoker."    . Alcohol use No     Comment: drinks red wine occasionally  . Drug use: Yes    Types: Marijuana  . Sexual activity: Yes    Birth control/ protection: Surgical   Other Topics Concern  . Not on file   Social History Narrative   Lives alone   Lives apt, 3 stories up; goes up and down stairs every day;    Generally climbs flights of  stairs 2 times a day     I appreciate the opportunity to take part in [place] M's  care. Please do not hesitate to contact me with questions.  Sincerely,   R. Edgar Frisk, MD

## 2017-06-25 NOTE — Telephone Encounter (Signed)
Refill done.  

## 2017-06-25 NOTE — Patient Instructions (Addendum)
Asthma/COPD exacerbation  Prednisone has been provided, 20 mg x 4 days, 10 mg x1 day, then stop.  A prescription has been provided for Incruse Ellipta, 1 inhalation daily.  For now, continue omalizumab injections, Symbicort 160-4 0.5 g, 2 inhalations via spacer device twice a day, montelukast 10 mg daily bedtime, and albuterol HFA, 1-2 inhalations every 4-6 hours as needed.  The patient has been asked to contact me if her symptoms persist or progress. Otherwise, she may return for follow up in 4 months.  Idiopathic urticaria/pruritus  Prednisone has been provided (as above).  Instructions have been discussed and provided for H1/H2 receptor blockade with titration to find lowest effective dose.  A prescription has been provided for levocetirizine 5 mg daily as needed.  A prescription has been provided for ranitidine 150 mg twice a day as needed.  Continue montelukast 10 mg daily at bedtime.  Perennial allergic rhinitis  Continue appropriate allergen avoidance measures, montelukast daily, fluticasone nasal spray, and nasal saline irrigation as needed.  Levocetirizine has been prescribed (as above).   Return in about 4 months (around 10/26/2017), or if symptoms worsen or fail to improve.  Urticaria (Hives)  . Levocetirizine (Xyzal) 5 mg twice a day and ranitidine (Zantac) 150 mg twice a day. If no symptoms for 7-14 days then decrease to. . Levocetirizine (Xyzal) 5 mg twice a day and ranitidine (Zantac) 150 mg once a day.  If no symptoms for 7-14 days then decrease to. . Levocetirizine (Xyzal) 5 mg twice a day.  If no symptoms for 7-14 days then decrease to. . Levocetirizine (Xyzal) 5 mg once a day.  May use Benadryl (diphenhydramine) as needed for breakthrough symptoms       If symptoms return, then step up dosage

## 2017-06-25 NOTE — Assessment & Plan Note (Signed)
   Prednisone has been provided (as above).  Instructions have been discussed and provided for H1/H2 receptor blockade with titration to find lowest effective dose.  A prescription has been provided for levocetirizine 5 mg daily as needed.  A prescription has been provided for ranitidine 150 mg twice a day as needed.  Continue montelukast 10 mg daily at bedtime.

## 2017-07-22 ENCOUNTER — Other Ambulatory Visit: Payer: Self-pay | Admitting: Emergency Medicine

## 2017-07-22 ENCOUNTER — Other Ambulatory Visit: Payer: Self-pay | Admitting: Internal Medicine

## 2017-07-23 ENCOUNTER — Ambulatory Visit (INDEPENDENT_AMBULATORY_CARE_PROVIDER_SITE_OTHER): Payer: Medicare Other | Admitting: Internal Medicine

## 2017-07-23 ENCOUNTER — Encounter: Payer: Self-pay | Admitting: Internal Medicine

## 2017-07-23 ENCOUNTER — Ambulatory Visit: Payer: Self-pay

## 2017-07-23 DIAGNOSIS — G249 Dystonia, unspecified: Secondary | ICD-10-CM | POA: Diagnosis not present

## 2017-07-23 DIAGNOSIS — J449 Chronic obstructive pulmonary disease, unspecified: Secondary | ICD-10-CM | POA: Diagnosis not present

## 2017-07-23 DIAGNOSIS — I10 Essential (primary) hypertension: Secondary | ICD-10-CM | POA: Diagnosis not present

## 2017-07-23 DIAGNOSIS — M7989 Other specified soft tissue disorders: Secondary | ICD-10-CM

## 2017-07-23 MED ORDER — TRIAMCINOLONE ACETONIDE 0.1 % EX CREA: TOPICAL_CREAM | CUTANEOUS | 0 refills | Status: DC

## 2017-07-23 NOTE — Patient Instructions (Signed)
Please continue all other medications as before, and refills have been done if requested.  Please have the pharmacy call with any other refills you may need.  Please continue your efforts at being more active, low cholesterol diet, and weight control.  You are otherwise up to date with prevention measures today.  Please keep your appointments with your specialists as you may have planned  Your handicap parking application was signed  You will be contacted regarding the referral for: Neurology for the hand  Please return in 6 months, or sooner if needed

## 2017-07-23 NOTE — Progress Notes (Signed)
Subjective:    Patient ID: [name], female    DOB: [birth year], 56 y.o.   MRN: [id number]  HPI  Here to f/u,  Pt denies chest pain, increasing sob or doe, wheezing, orthopnea, PND, increased LE swelling, palpitations, dizziness or syncope, and pt still with persistent COPD and requests handicap application for parking renewal.  Pt also has persistent mild LE slight swelling but no worse than usual..  Pt denies new neurological symptoms such as new headache, or facial or extremity weakness or numbness.  Pt denies polydipsia, polyuria, Pt states overall good compliance with meds, mostly trying to follow appropriate diet, with wt overall stable,  but little exercise however. Also mentions right hand becomes claw like with unable to move fingers, this only occurs with using it, such as with cooking meals, now occurring daily.   Past Medical History:  Diagnosis Date  . ALLERGIC RHINITIS 05/12/2009   Qualifier: Diagnosis of  By: Lenn Cal Deborra Medina), Wynona Canes    . Anemia, iron deficiency 12/22/2014  . Angina   . Anxiety   . Arthritis   . Asthma   . ASTHMA 05/12/2009   Severe AFL (Spirometry 05/2009: pre-BD FEV1 0.87L 34% pred, post-BD FEV1 1.11L 44% pred) Volumes hyperinflated Decreased DLCO that does not fully correct to normal range for alveolar volume.     Marland Kitchen COPD 08/24/2009   Qualifier: Diagnosis of  By: Burnett Kanaris    . COPD (chronic obstructive pulmonary disease) ([place])   . Depression   . Fibromyalgia 05/14/2014  . GERD (gastroesophageal reflux disease)   . Hyperlipidemia 04/20/2017  . Hypertension   . HYPERTENSION 05/12/2009   Qualifier: Diagnosis of  By: Lenn Cal Deborra Medina), Wynona Canes    . Migraine   . Peripheral vascular disease ([place])   . Prediabetes 02/23/2014  . Urticaria    Past Surgical History:  Procedure Laterality Date  . ABDOMINAL HYSTERECTOMY    . BACK SURGERY    . COLONOSCOPY  12/20/2011   Procedure: COLONOSCOPY;  Surgeon: Landry Dyke, MD;  Location: [facility];   Service: Endoscopy;  Laterality: N/A;  . COLONOSCOPY  03/05/2012   Procedure: COLONOSCOPY;  Surgeon: Landry Dyke, MD;  Location: [facility];  Service: Endoscopy;  Laterality: N/A;  . DIAGNOSTIC LAPAROSCOPY    . HEMORRHOID SURGERY    . INCISE AND DRAIN ABCESS    . KIDNEY STONE SURGERY    . NECK SURGERY    . TOE SURGERY    . TUBAL LIGATION      reports that she has been smoking Cigarettes.  She has a 18.00 pack-year smoking history. She has never used smokeless tobacco. She reports that she uses drugs, including Marijuana. She reports that she does not drink alcohol. family history includes Alcohol abuse in her brother and father; Anesthesia problems in her father; Asthma in her brother, mother, and sister; COPD in her mother; Cancer in her father; Diabetes in her brother, father, mother, sister, and sister; Heart disease in her brother, mother, and sister; Hypertension in her sister; Kidney disease in her father; Sleep apnea in her brother. Allergies  Allergen Reactions  . Sulfa Antibiotics Nausea And Vomiting  . Sulfonamide Derivatives Nausea And Vomiting    REACTION: n/v   Current Outpatient Prescriptions on File Prior to Visit  Medication Sig Dispense Refill  . albuterol (PROVENTIL) (2.5 MG/3ML) 0.083% nebulizer solution Take 2.5 mg by nebulization every 4 (four) hours as needed for wheezing or shortness of breath. Reported on 06/04/2016    .  albuterol (VENTOLIN HFA) 108 (90 Base) MCG/ACT inhaler One to two puffs every 4 to 6 hours as needed 1 Inhaler 5  . aspirin 325 MG tablet Take 325 mg by mouth daily.    Marland Kitchen azelastine (ASTELIN) 0.1 % nasal spray USE 2 SPRAYS IN EACH NOSTRIL TWICE DAILY AS DIRECTED 30 mL 5  . cetirizine (ZYRTEC) 10 MG tablet Take 1 tablet (10 mg total) by mouth daily. 30 tablet 5  . cholecalciferol (VITAMIN D) 1000 units tablet Take 1,000 Units by mouth daily.    . divalproex (DEPAKOTE ER) 500 MG 24 hr tablet Take 1 tablet (500 mg total) by mouth daily. 30 tablet  2  . Fe Cbn-Fe Gluc-FA-B12-C-DSS (FERRALET 90) 90-1 MG TABS Take 1 tablet by mouth daily. 90 each 1  . fluticasone (FLONASE) 50 MCG/ACT nasal spray Place 2 sprays into both nostrils daily. 16 g 5  . gabapentin (NEURONTIN) 100 MG capsule TAKE 2 CAPSULES BY MOUTH AT BEDTIME 180 capsule 1  . hydrochlorothiazide (MICROZIDE) 12.5 MG capsule Take 1 capsule (12.5 mg total) by mouth daily. 30 capsule 11  . irbesartan-hydrochlorothiazide (AVALIDE) 150-12.5 MG tablet TAKE 1 TABLET BY MOUTH EVERY DAY 90 tablet 0  . levocetirizine (XYZAL) 5 MG tablet Take 1 tablet (5 mg total) by mouth daily as needed for allergies. 30 tablet 5  . meloxicam (MOBIC) 15 MG tablet Take 1 tablet (15 mg total) by mouth daily. 90 tablet 3  . montelukast (SINGULAIR) 10 MG tablet TAKE 1 TABLET(10 MG) BY MOUTH AT BEDTIME 90 tablet 0  . NONFORMULARY OR COMPOUNDED ITEM Estradiol 0.02 % 30ml prefilled applicator Sig: apply twice a week 24 each 4  . pantoprazole (PROTONIX) 40 MG tablet TAKE 1 TABLET BY MOUTH DAILY 90 tablet 0  . pantoprazole (PROTONIX) 40 MG tablet TAKE 1 TABLET BY MOUTH DAILY 30 tablet 0  . pravastatin (PRAVACHOL) 40 MG tablet Take 1 tablet (40 mg total) by mouth daily. 90 tablet 3  . QUEtiapine (SEROQUEL) 100 MG tablet Take 1 tablet (100 mg total) by mouth at bedtime. 30 tablet 2  . ranitidine (ZANTAC) 150 MG tablet Take 1 tablet (150 mg total) by mouth 2 (two) times daily as needed for heartburn. 60 tablet 5  . SYMBICORT 160-4.5 MCG/ACT inhaler INHALE 2 PUFFS INTO THE LUNGS TWICE DAILY 10.2 g 0  . tiotropium (SPIRIVA HANDIHALER) 18 MCG inhalation capsule Place 1 capsule (18 mcg total) into inhaler and inhale daily. 30 capsule 5  . tiZANidine (ZANAFLEX) 4 MG tablet TAKE 1 TABLET(4 MG) BY MOUTH EVERY 6 HOURS AS NEEDED FOR MUSCLE SPASMS 360 tablet 0  . tiZANidine (ZANAFLEX) 4 MG tablet TAKE 1 TABLET(4 MG) BY MOUTH EVERY 6 HOURS AS NEEDED FOR MUSCLE SPASMS 60 tablet 0  . traMADol (ULTRAM) 50 MG tablet Take 1 tablet (50 mg  total) by mouth every 6 (six) hours as needed. For pain. 30 tablet 3  . umeclidinium bromide (INCRUSE ELLIPTA) 62.5 MCG/INH AEPB Inhale 1 puff into the lungs daily. 30 each 5  . VENTOLIN HFA 108 (90 Base) MCG/ACT inhaler INHALE 1 TO 2 PUFFS BY MOUTH EVERY 4 TO 6 HOURS AS NEEDED 18 g 0   Current Facility-Administered Medications on File Prior to Visit  Medication Dose Route Frequency Provider Last Rate Last Dose  . ipratropium-albuterol (DUONEB) 0.5-2.5 (3) MG/3ML nebulizer solution 3 mL  3 mL Nebulization Once Bobbitt, [place] Muta, MD      . omalizumab Arvid Right) injection 300 mg  300 mg Subcutaneous Q28 days Ernst Bowler,  Gwenith Daily, MD   300 mg at 06/25/17 0946  . predniSONE (DELTASONE) tablet 10 mg  10 mg Oral Q breakfast Bobbitt, [place] Muta, MD       Review of Systems  Constitutional: Negative for other unusual diaphoresis or sweats HENT: Negative for ear discharge or swelling Eyes: Negative for other worsening visual disturbances Respiratory: Negative for stridor or other swelling  Gastrointestinal: Negative for worsening distension or other blood Genitourinary: Negative for retention or other urinary change Musculoskeletal: Negative for other MSK pain or swelling Skin: Negative for color change or other new lesions Neurological: Negative for worsening tremors and other numbness  Psychiatric/Behavioral: Negative for worsening agitation or other fatigue All other exam findings    Objective:   Physical Exam BP 118/76   Pulse 65   Ht 5\' 2"  (1.575 m)   Wt 179 lb (81.2 kg)   SpO2 99%   BMI 32.74 kg/m  VS noted,  Constitutional: Pt appears in NAD HENT: Head: NCAT.  Right Ear: External ear normal.  Left Ear: External ear normal.  Eyes: . Pupils are equal, round, and reactive to light. Conjunctivae and EOM are normal Nose: without d/c or deformity Neck: Neck supple. Gross normal ROM Cardiovascular: Normal rate and regular rhythm.   Pulmonary/Chest: Effort normal and breath sounds  decreased without rales or wheezing.  Abd:  Soft, NT, ND, + BS, no organomegaly Neurological: Pt is alert. At baseline orientation, motor grossly intact Skin: Skin is warm. No rashes, other new lesions, trace bilat chronic LE edema Psychiatric: Pt behavior is normal without agitation  No other exam findings Lab Results  Component Value Date   WBC 10.7 (H) 04/19/2017   HGB 12.7 04/19/2017   HCT 38.0 04/19/2017   PLT 318.0 04/19/2017   GLUCOSE 89 04/19/2017   CHOL 199 04/19/2017   TRIG 69.0 04/19/2017   HDL 59.20 04/19/2017   LDLCALC 126 (H) 04/19/2017   ALT 15 04/19/2017   AST 16 04/19/2017   NA 144 04/19/2017   K 3.8 04/19/2017   CL 106 04/19/2017   CREATININE 0.82 04/19/2017   BUN 12 04/19/2017   CO2 32 04/19/2017   TSH 0.95 04/19/2017   HGBA1C 5.5 12/15/2013   05/03/2017 Transthoracic Echocardiography - summary Study Conclusions  - Left ventricle: The cavity size was normal. Systolic function was   normal. The estimated ejection fraction was in the range of 55%   to 60%. Images were inadequate for LV wall motion assessment.   Left ventricular diastolic function parameters were normal. - Mitral valve: Valve area by pressure half-time: 2.29 cm^2. - Pulmonary arteries: Systolic pressure could not be accurately   estimated.    Assessment & Plan:

## 2017-07-26 DIAGNOSIS — M7989 Other specified soft tissue disorders: Secondary | ICD-10-CM | POA: Insufficient documentation

## 2017-07-26 NOTE — Assessment & Plan Note (Signed)
asympt now but occurring daily, etiology unclear, for neurology referral

## 2017-07-26 NOTE — Assessment & Plan Note (Signed)
stable overall by history and exam, recent data reviewed with pt, and pt to continue medical treatment as before,  to f/u any worsening symptoms or concerns BP Readings from Last 3 Encounters:  07/23/17 118/76  06/25/17 136/78  04/19/17 116/76

## 2017-07-26 NOTE — Assessment & Plan Note (Signed)
stable overall by history and exam, and pt to continue medical treatment as before,  to f/u any worsening symptoms or concerns, ok for handicap parking application renewal signed

## 2017-07-26 NOTE — Assessment & Plan Note (Signed)
Likely venous insufficiency, but cant r/o pulm HTN related, stable, to cont same tx

## 2017-07-29 ENCOUNTER — Telehealth: Payer: Self-pay | Admitting: Internal Medicine

## 2017-07-29 NOTE — Telephone Encounter (Signed)
Please ask pt to call the [facility] center, since this would affect their treatment plan for her. thanks

## 2017-07-29 NOTE — Telephone Encounter (Signed)
Pt called stating that "everything is itching". Her eyes, nose, arms, legs, etc. She wanted to know if someone could prescribe something. She mentioned Prednizone. If so, the pt uses [facility] on [address].

## 2017-07-30 NOTE — Telephone Encounter (Signed)
Notified pt w/MD response.../lmb 

## 2017-07-31 ENCOUNTER — Ambulatory Visit (INDEPENDENT_AMBULATORY_CARE_PROVIDER_SITE_OTHER): Payer: Medicare Other | Admitting: *Deleted

## 2017-07-31 DIAGNOSIS — J4540 Moderate persistent asthma, uncomplicated: Secondary | ICD-10-CM

## 2017-08-06 ENCOUNTER — Encounter: Payer: Self-pay | Admitting: Neurology

## 2017-08-14 ENCOUNTER — Other Ambulatory Visit: Payer: Self-pay | Admitting: Emergency Medicine

## 2017-08-20 ENCOUNTER — Other Ambulatory Visit: Payer: Self-pay | Admitting: Internal Medicine

## 2017-08-26 ENCOUNTER — Other Ambulatory Visit: Payer: Self-pay | Admitting: *Deleted

## 2017-08-26 DIAGNOSIS — L501 Idiopathic urticaria: Secondary | ICD-10-CM

## 2017-08-26 MED ORDER — CETIRIZINE HCL 10 MG PO TABS: 10.0000 mg | ORAL_TABLET | Freq: Every day | ORAL | 2 refills | Status: DC

## 2017-08-26 MED ORDER — RANITIDINE HCL 150 MG PO TABS: 150.0000 mg | ORAL_TABLET | Freq: Two times a day (BID) | ORAL | 2 refills | Status: DC | PRN

## 2017-09-03 ENCOUNTER — Ambulatory Visit: Payer: Medicare Other | Admitting: Family Medicine

## 2017-09-03 ENCOUNTER — Ambulatory Visit (INDEPENDENT_AMBULATORY_CARE_PROVIDER_SITE_OTHER): Payer: Medicare Other

## 2017-09-03 DIAGNOSIS — J4540 Moderate persistent asthma, uncomplicated: Secondary | ICD-10-CM | POA: Diagnosis not present

## 2017-09-03 DIAGNOSIS — R059 Cough, unspecified: Secondary | ICD-10-CM

## 2017-09-03 DIAGNOSIS — J441 Chronic obstructive pulmonary disease with (acute) exacerbation: Secondary | ICD-10-CM

## 2017-09-03 DIAGNOSIS — R05 Cough: Secondary | ICD-10-CM

## 2017-09-03 DIAGNOSIS — L508 Other urticaria: Secondary | ICD-10-CM

## 2017-09-03 DIAGNOSIS — J45901 Unspecified asthma with (acute) exacerbation: Secondary | ICD-10-CM

## 2017-09-03 DIAGNOSIS — J310 Chronic rhinitis: Secondary | ICD-10-CM

## 2017-09-03 MED ORDER — AMOXICILLIN-POT CLAVULANATE 875-125 MG PO TABS: 1.0000 | ORAL_TABLET | Freq: Two times a day (BID) | ORAL | 0 refills | Status: AC

## 2017-09-03 MED ORDER — METHYLPREDNISOLONE ACETATE 80 MG/ML IJ SUSP
80.0000 mg | Freq: Once | INTRAMUSCULAR | Status: AC
  Administered 2017-09-03: 80 mg via INTRAMUSCULAR

## 2017-09-03 MED ORDER — PREDNISONE 10 MG PO TABS: ORAL_TABLET | ORAL | 0 refills | Status: DC

## 2017-09-03 NOTE — Patient Instructions (Addendum)
1. Severe asthma with acute exacerbation, unspecified whether persistent - Chest X-ray - Begin Augmentin 875/125 twice a day for 10 days - DepoMedrol 80 mg IM - Dose pack steroid, first given in clinic - DuoNeb given in clinic - Keep appointment with primary care doctor in the morning - Provide patient with a spacer device  2. Chronic rhinitis - Continue current meds  3. Chronic urticaria - Continue Xolair injections

## 2017-09-03 NOTE — Progress Notes (Signed)
Patient came in this morning for Xolair injection. Okayed with Dr. Neldon Mc prior to injection due to patient c/o asthma problems. Patient stated that Saturday she began having dizzy spells and difficulty breathing. She did fall in the bathroom Saturday and hit her head.    Patient called back in the afternoon and wanted to be seen because she was much worse than before. Patient is being seen by FNP.

## 2017-09-03 NOTE — Progress Notes (Addendum)
FOLLOW UP  Date of Service/Encounter:  09/03/17   Assessment:   Severe asthma with acute exacerbation, unspecified whether persistent   Chronic rhinitis  Chronic urticaria  Cough  Acute exacerbation of COPD with asthma ([place])   Asthma Reportables:  Severity: severe persistent  Risk: high Control: very poorly controlled  Seasonal Influenza Vaccine: no but encouraged    Plan/Recommendations:    1. Severe asthma with acute exacerbation, unspecified whether persistent - Chest X-ray - Begin Augmentin 875/125 twice a day for 10 days - DepoMedrol 80 mg IM - Dose pack steroid, first given in clinic - DuoNeb given in clinic - Keep appointment with primary care doctor in the morning - Provide patient with a spacer device  2. Chronic rhinitis - Continue current meds  3. Chronic urticaria - Continue Xolair injections  4. Follow up: - Return to clinic in 1 week. Patient reports she will be here for her Xolair injection also.   Subjective:   [name] is a 56 y.o. female presenting today for follow up of  Chief Complaint  Patient presents with  . Asthma    [name] has a history of the following: Patient Active Problem List   Diagnosis Date Noted  . Leg swelling 07/26/2017  . 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 Hyperlipidemia 04/20/2017  . Pedal edema 04/19/2017  . Bipolar I disorder ([place]) 09/19/2016  . Facial pain 07/12/2016  . Rash 06/14/2016  . Asthma/COPD exacerbation 02/21/2016  . Migraine 01/25/2016  . Idiopathic urticaria/pruritus 01/23/2016  . Perennial allergic rhinitis 01/23/2016  . Oral candidiasis 01/23/2016  . Greater trochanteric bursitis of both hips 09/08/2015  . Chest pain 06/22/2015  . Itching 06/22/2015  . Bilateral knee pain 06/22/2015  . Bilateral hip pain 06/22/2015  . Anemia, iron deficiency 12/22/2014  . Fatigue 12/21/2014  . Intertrigo 08/07/2014  . Dyspareunia 07/12/2014  . Menopausal  syndrome (hot flushes) 07/12/2014  . History of tobacco abuse 07/12/2014  . Dizziness 06/22/2014  . Weakness 06/22/2014  . Fibromyalgia 05/14/2014  . Routine general medical examination at a health care facility 04/22/2014  . Recurrent boils 04/22/2014  . Recurrent falls 04/22/2014  . Peripheral vascular disease ([place])   . Depression   . Anxiety   . GERD (gastroesophageal reflux disease)   . Prediabetes 02/23/2014  . COPD exacerbation ([place]) 02/23/2014  . Screening mammogram for high-risk patient 02/23/2014  . Back pain 07/22/2013  . COPD (chronic obstructive pulmonary disease) ([place]) 08/24/2009  . Headache(784.0) 08/24/2009  . HTN (hypertension) 05/12/2009  . Allergic rhinitis 05/12/2009  . Asthma 05/12/2009    History obtained from: chart review and Patient interview.  [name]'s Primary Care Provider is Biagio Borg,  MD.     [name] is a 56 y.o. female presenting for a sick visit. She was last in the office on 06/25/2017 for an evaluation asthma/COPD, chronic rhinitis rhinitis, and idiopathic urticaria/pruritus. At that time she was reporting itching everywhere and experiencing lower respiratory symptoms including dyspnea and wheezing with moderate exertion such as climbing the stairs. At that time she was given a prednisone taper, started Incruse Ellipta one inhalation a day, continued on Xolair injections, continued on Symbicort 160/4.52 inhalations with a spacer twice a day, montelukast 10 mg a day, and albuterol inhalation as needed.   Since the last visit, she reports that she has been having shortness of breath and wheezing for the last 3 months which became worse on Saturday night. [name] reports feeling  hot and dizzy just before falling down in her bedroom  and hitting the left side of her face on a piece of furniture on Saturday night where her son found her a few minutes later on the floor. She reports at least 2 other incidences of "passing out". She reports  an increase in shortness of breath and wheezing particularly after walking up the stairs at her home and an increase in nighttime awakenings with cough with productive yellow sputum, shortness of breath and wheezing. She reports using albuterol rescue inhaler several times each day. She reports she continues to use Astelin as needed Symbicort 160-4.52 puffs 2 times daily cetirizine 10 mg daily, Flonase as needed size all 5 mg a day, Singulair 10 mg a day, rimantadine 150 mg a day Protonix 40 mg a day, Spiriva 18 g a day, triamcinolone cream as needed, and Incruse Ellipta daily. She has no significant nasal symptom complaints today.   Otherwise, there have been no changes to her past medical history, surgical history, family history, or social history.  Review of Systems: a 14-point review of systems is pertinent for what is mentioned in HPI.  Otherwise, all other systems were negative. Constitutional: negative other than that listed in the HPI Eyes: negative other than that listed in the HPI Ears, nose, mouth, throat, and face: negative other than that listed in the HPI Respiratory: negative other than that listed in the HPI Cardiovascular: negative other than that listed in the HPI Gastrointestinal: negative other than that listed in the HPI Genitourinary: negative other than that listed in the HPI Integument: negative other than that listed in the HPI Hematologic: negative other than that listed in the HPI Musculoskeletal: negative other than that listed in the HPI Neurological: negative other than that listed in the HPI Allergy/Immunologic: negative other than that listed in the HPI    Objective:   There were no vitals taken for this visit. There is no height or weight on file to calculate BMI.   Physical Exam:  General: Alert, interactive, in no acute distress. Eyes: No conjunctival injection present on the right and No conjunctival injection present on the left Ears: Right TM  pearly gray with normal light reflex and Left TM pearly gray with normal light reflex.  Nose/Throat: External nose within normal limits and septum midline, turbinates minimally edematous with clear discharge, post-pharynx mildly erythematous without cobblestoning in the posterior oropharynx. Tonsils unremarklable without exudates Neck: Supple without thyromegaly. Lungs: Decreased breath sounds with expiratory wheezing bilaterally. Increased work of breathing. CV: Normal S1/S2, no murmurs. Capillary refill <2 seconds.  Skin: Warm and dry, without lesions or rashes. Neuro:   Grossly intact. No focal deficits appreciated. Responsive to questions.   Spirometry: results abnormal (FEV1: 0.79/39%, FVC: 1.12/44%, FEV1/FVC: .71/88%).    Spirometry consistent with possible restrictive disease. Albuterol/Atrovent nebulizer treatment given in clinic with significant improvement.   Dara Hoyer, FNP-C [facility]   I have provided oversight concerning Gareth Morgan' evaluation and treatment of this patient's health issues addressed during today's encounter. I agree with the assessment and therapeutic plan as outlined in the note.   Signed,   Jiles Prows, MD,  Allergy and Immunology,  [facility].

## 2017-09-04 ENCOUNTER — Ambulatory Visit (INDEPENDENT_AMBULATORY_CARE_PROVIDER_SITE_OTHER): Payer: Medicare Other | Admitting: Internal Medicine

## 2017-09-04 ENCOUNTER — Ambulatory Visit
Admission: RE | Admit: 2017-09-04 | Discharge: 2017-09-04 | Disposition: A | Discharge status: Home / Self Care | Payer: Medicare Other | Source: Ambulatory Visit | Attending: Allergy and Immunology | Admitting: Allergy and Immunology

## 2017-09-04 ENCOUNTER — Ambulatory Visit (INDEPENDENT_AMBULATORY_CARE_PROVIDER_SITE_OTHER)
Admission: RE | Admit: 2017-09-04 | Discharge: 2017-09-04 | Disposition: A | Discharge status: Home / Self Care | Payer: Medicare Other | Source: Ambulatory Visit | Attending: Internal Medicine | Admitting: Internal Medicine

## 2017-09-04 ENCOUNTER — Other Ambulatory Visit: Payer: Self-pay | Admitting: Internal Medicine

## 2017-09-04 ENCOUNTER — Encounter: Payer: Self-pay | Admitting: Internal Medicine

## 2017-09-04 ENCOUNTER — Ambulatory Visit (HOSPITAL_COMMUNITY)
Admission: RE | Admit: 2017-09-04 | Discharge: 2017-09-04 | Disposition: A | Discharge status: Home / Self Care | Payer: Medicare Other | Source: Ambulatory Visit | Attending: Internal Medicine | Admitting: Internal Medicine

## 2017-09-04 VITALS — BP 116/72 | HR 68 | Temp 98.4°F | Ht 62.0 in | Wt 171.0 lb

## 2017-09-04 DIAGNOSIS — J441 Chronic obstructive pulmonary disease with (acute) exacerbation: Secondary | ICD-10-CM | POA: Diagnosis not present

## 2017-09-04 DIAGNOSIS — R55 Syncope and collapse: Secondary | ICD-10-CM | POA: Diagnosis not present

## 2017-09-04 DIAGNOSIS — R29818 Other symptoms and signs involving the nervous system: Secondary | ICD-10-CM

## 2017-09-04 DIAGNOSIS — R51 Headache: Secondary | ICD-10-CM

## 2017-09-04 DIAGNOSIS — S0990XA Unspecified injury of head, initial encounter: Secondary | ICD-10-CM | POA: Diagnosis not present

## 2017-09-04 DIAGNOSIS — R519 Headache, unspecified: Secondary | ICD-10-CM | POA: Insufficient documentation

## 2017-09-04 DIAGNOSIS — J45901 Unspecified asthma with (acute) exacerbation: Secondary | ICD-10-CM

## 2017-09-04 DIAGNOSIS — G249 Dystonia, unspecified: Secondary | ICD-10-CM | POA: Diagnosis not present

## 2017-09-04 DIAGNOSIS — K5900 Constipation, unspecified: Secondary | ICD-10-CM | POA: Diagnosis not present

## 2017-09-04 DIAGNOSIS — I6523 Occlusion and stenosis of bilateral carotid arteries: Secondary | ICD-10-CM | POA: Diagnosis not present

## 2017-09-04 DIAGNOSIS — R05 Cough: Secondary | ICD-10-CM | POA: Diagnosis not present

## 2017-09-04 LAB — VAS US CAROTID
LEFT ECA DIAS: -17 cm/s
LEFT VERTEBRAL DIAS: 24 cm/s
Left CCA dist dias: -21 cm/s
Left CCA dist sys: -76 cm/s
Left CCA prox dias: 25 cm/s
Left CCA prox sys: 98 cm/s
Left ICA dist dias: -29 cm/s
Left ICA dist sys: -90 cm/s
Left ICA prox dias: -27 cm/s
Left ICA prox sys: -71 cm/s
RIGHT ECA DIAS: -12 cm/s
RIGHT VERTEBRAL DIAS: 23 cm/s
Right CCA prox dias: 19 cm/s
Right CCA prox sys: 84 cm/s
Right cca dist sys: -54 cm/s

## 2017-09-04 MED ORDER — BENZONATATE 100 MG PO CAPS: ORAL_CAPSULE | ORAL | 0 refills | Status: DC

## 2017-09-04 MED ORDER — LACTULOSE 10 GM/15ML PO SOLN: ORAL | 1 refills | Status: DC

## 2017-09-04 NOTE — Assessment & Plan Note (Signed)
To f/u cxr done this am, tess perle prn, o/w cont same tx

## 2017-09-04 NOTE — Progress Notes (Signed)
*  PRELIMINARY RESULTS* Vascular Ultrasound Carotid Duplex (Doppler) has been completed.  Preliminary findings: Bilateral: No significant (1-39%) ICA stenosis. Antegrade vertebral flow.    Landry Mellow, RDMS, RVT   09/04/2017, 4:20 PM

## 2017-09-04 NOTE — Assessment & Plan Note (Signed)
S/p fall with left head swelling/bruising, for tylenol prn, for head ct as above

## 2017-09-04 NOTE — Progress Notes (Signed)
Subjective:    Patient ID: [name], female    DOB: 1961/06/15, 56 y.o.   MRN: [id number]  HPI  Here to f/u, saw allergy and asthma NP yesterday, tx with antibiotic with bronchitis, had cxr this AM.  Pt denies chest pain, increased sob or doe, wheezing, orthopnea, PND, increased LE swelling, palpitations, but is having dizziness and syncope that ONLY occurs with cough, happened twice recently in the past 4 days.  Out for several minutes each time, witiness by family, no sz activity, last episode fell to left side of her face and head, still sore and nose sore. And then had n/v just after   Had cxr this am per allergy and asthma.  Also with constipation in last 2 wks despite mult OTC, last BM more than 2 wks, still with crampy pain and loss of appetite, limits her activity on top of her asthma, just "feels terrible" and no energy.  Pt denies new neurological symptoms such as new facial or extremity weakness or numbness or vision change Past Medical History:  Diagnosis Date  . ALLERGIC RHINITIS 05/12/2009   Qualifier: Diagnosis of  By: Lenn Cal Deborra Medina), Wynona Canes    . Anemia, iron deficiency 12/22/2014  . Angina   . Anxiety   . Arthritis   . Asthma   . ASTHMA 05/12/2009   Severe AFL (Spirometry 05/2009: pre-BD FEV1 0.87L 34% pred, post-BD FEV1 1.11L 44% pred) Volumes hyperinflated Decreased DLCO that does not fully correct to normal range for alveolar volume.     Marland Kitchen COPD 08/24/2009   Qualifier: Diagnosis of  By: Burnett Kanaris    . COPD (chronic obstructive pulmonary disease) ([place])   . Depression   . Fibromyalgia 05/14/2014  . GERD (gastroesophageal reflux disease)   . Hyperlipidemia 04/20/2017  . Hypertension   . HYPERTENSION 05/12/2009   Qualifier: Diagnosis of  By: Lenn Cal Deborra Medina), Wynona Canes    . Migraine   . Peripheral vascular disease ([place])   . Prediabetes 02/23/2014  . Urticaria    Past Surgical History:  Procedure Laterality Date  . ABDOMINAL HYSTERECTOMY    . BACK SURGERY    .  COLONOSCOPY  12/20/2011   Procedure: COLONOSCOPY;  Surgeon: Landry Dyke, MD;  Location: [facility];  Service: Endoscopy;  Laterality: N/A;  . COLONOSCOPY  03/05/2012   Procedure: COLONOSCOPY;  Surgeon: Landry Dyke, MD;  Location: [facility];  Service: Endoscopy;  Laterality: N/A;  . DIAGNOSTIC LAPAROSCOPY    . HEMORRHOID SURGERY    . INCISE AND DRAIN ABCESS    . KIDNEY STONE SURGERY    . NECK SURGERY    . TOE SURGERY    . TUBAL LIGATION      reports that she has been smoking Cigarettes.  She has a 18.00 pack-year smoking history. She has never used smokeless tobacco. She reports that she uses drugs, including Marijuana. She reports that she does not drink alcohol. family history includes Alcohol abuse in her brother and father; Anesthesia problems in her father; Asthma in her brother, mother, and sister; COPD in her mother; Cancer in her father; Diabetes in her brother, father, mother, sister, and sister; Heart disease in her brother, mother, and sister; Hypertension in her sister; Kidney disease in her father; Sleep apnea in her brother. Allergies  Allergen Reactions  . Sulfa Antibiotics Nausea And Vomiting  . Sulfonamide Derivatives Nausea And Vomiting    REACTION: n/v   Current Outpatient Prescriptions on File Prior to Visit  Medication Sig Dispense  Refill  . albuterol (PROVENTIL) (2.5 MG/3ML) 0.083% nebulizer solution Take 2.5 mg by nebulization every 4 (four) hours as needed for wheezing or shortness of breath. Reported on 06/04/2016    . albuterol (VENTOLIN HFA) 108 (90 Base) MCG/ACT inhaler One to two puffs every 4 to 6 hours as needed 1 Inhaler 5  . amoxicillin-clavulanate (AUGMENTIN) 875-125 MG tablet Take 1 tablet by mouth 2 (two) times daily. 20 tablet 0  . aspirin 325 MG tablet Take 325 mg by mouth daily.    Marland Kitchen azelastine (ASTELIN) 0.1 % nasal spray USE 2 SPRAYS IN EACH NOSTRIL TWICE DAILY AS DIRECTED 30 mL 5  . cetirizine (ZYRTEC) 10 MG tablet Take 1 tablet (10 mg  total) by mouth daily. 30 tablet 2  . cholecalciferol (VITAMIN D) 1000 units tablet Take 1,000 Units by mouth daily.    . divalproex (DEPAKOTE ER) 500 MG 24 hr tablet Take 1 tablet (500 mg total) by mouth daily. 30 tablet 2  . Fe Cbn-Fe Gluc-FA-B12-C-DSS (FERRALET 90) 90-1 MG TABS Take 1 tablet by mouth daily. 90 each 1  . fluticasone (FLONASE) 50 MCG/ACT nasal spray Place 2 sprays into both nostrils daily. 16 g 5  . gabapentin (NEURONTIN) 100 MG capsule TAKE 2 CAPSULES BY MOUTH AT BEDTIME 180 capsule 1  . hydrochlorothiazide (MICROZIDE) 12.5 MG capsule Take 1 capsule (12.5 mg total) by mouth daily. 30 capsule 11  . irbesartan-hydrochlorothiazide (AVALIDE) 150-12.5 MG tablet TAKE 1 TABLET BY MOUTH EVERY DAY 90 tablet 0  . levocetirizine (XYZAL) 5 MG tablet Take 1 tablet (5 mg total) by mouth daily as needed for allergies. 30 tablet 5  . meloxicam (MOBIC) 15 MG tablet Take 1 tablet (15 mg total) by mouth daily. 90 tablet 3  . montelukast (SINGULAIR) 10 MG tablet TAKE 1 TABLET(10 MG) BY MOUTH AT BEDTIME 90 tablet 0  . NONFORMULARY OR COMPOUNDED ITEM Estradiol 0.02 % 35ml prefilled applicator Sig: apply twice a week 24 each 4  . pantoprazole (PROTONIX) 40 MG tablet TAKE 1 TABLET BY MOUTH DAILY 90 tablet 0  . pantoprazole (PROTONIX) 40 MG tablet TAKE 1 TABLET BY MOUTH DAILY 30 tablet 0  . pravastatin (PRAVACHOL) 40 MG tablet Take 1 tablet (40 mg total) by mouth daily. 90 tablet 3  . predniSONE (DELTASONE) 10 MG tablet Take 20 mg twice daily for 3 days, then 20 mg daily for 2 days and 10 mg on the last day. 20 tablet 0  . QUEtiapine (SEROQUEL) 100 MG tablet Take 1 tablet (100 mg total) by mouth at bedtime. 30 tablet 2  . ranitidine (ZANTAC) 150 MG tablet Take 1 tablet (150 mg total) by mouth 2 (two) times daily as needed for heartburn. 60 tablet 2  . SYMBICORT 160-4.5 MCG/ACT inhaler INHALE 2 PUFFS INTO THE LUNGS TWICE DAILY 10.2 g 0  . tiotropium (SPIRIVA HANDIHALER) 18 MCG inhalation capsule Place 1  capsule (18 mcg total) into inhaler and inhale daily. 30 capsule 5  . tiZANidine (ZANAFLEX) 4 MG tablet TAKE 1 TABLET(4 MG) BY MOUTH EVERY 6 HOURS AS NEEDED FOR MUSCLE SPASMS 60 tablet 0  . traMADol (ULTRAM) 50 MG tablet Take 1 tablet (50 mg total) by mouth every 6 (six) hours as needed. For pain. 30 tablet 3  . triamcinolone cream (KENALOG) 0.1 % APPLY EXTERNALLY TO THE AFFECTED AREA TWICE DAILY 30 g 0  . umeclidinium bromide (INCRUSE ELLIPTA) 62.5 MCG/INH AEPB Inhale 1 puff into the lungs daily. 30 each 5  . VENTOLIN HFA 108 (  90 Base) MCG/ACT inhaler INHALE 1 TO 2 PUFFS BY MOUTH EVERY 4 TO 6 HOURS AS NEEDED 18 g 0   Current Facility-Administered Medications on File Prior to Visit  Medication Dose Route Frequency Provider Last Rate Last Dose  . ipratropium-albuterol (DUONEB) 0.5-2.5 (3) MG/3ML nebulizer solution 3 mL  3 mL Nebulization Once Bobbitt, [place] Muta, MD      . omalizumab Arvid Right) injection 300 mg  300 mg Subcutaneous Q28 days Valentina Shaggy, MD   300 mg at 09/03/17 1031   Review of Systems  Constitutional: Negative for other unusual diaphoresis or sweats HENT: Negative for ear discharge or swelling Eyes: Negative for other worsening visual disturbances Respiratory: Negative for stridor or other swelling  Gastrointestinal: Negative for worsening distension or other blood Genitourinary: Negative for retention or other urinary change Musculoskeletal: Negative for other MSK pain or swelling Skin: Negative for color change or other new lesions Neurological: Negative for worsening tremors and other numbness  Psychiatric/Behavioral: Negative for worsening agitation or other fatigue All other system neg per pt    Objective:   Physical Exam BP 116/72   Pulse 68   Temp 98.4 F (36.9 C) (Oral)   Ht 5\' 2"  (1.575 m)   Wt 171 lb (77.6 kg)   SpO2 99%   BMI 31.28 kg/m  VS noted,  Constitutional: Pt appears in NAD HENT: Head: NCAT.except for large bruising and mild swelling  to left temporoparietal area   Right Ear: External ear normal.  Left Ear: External ear normal.  Eyes: . Pupils are equal, round, and reactive to light. Conjunctivae and EOM are normal Nose: without d/c or deformity Neck: Neck supple. Gross normal ROM Cardiovascular: Normal rate and regular rhythm.   Pulmonary/Chest: Effort normal and breath sounds without rales or wheezing.  Abd:  Soft, NT, ND, + BS, no organomegaly - benign Neurological: Pt is alert. At baseline orientation, motor grossly intact, cn 2-12 intact Skin: Skin is warm. No rashes, other new lesions, no LE edema Psychiatric: Pt behavior is normal without agitation  No other exam findings Lab Results  Component Value Date   WBC 10.7 (H) 04/19/2017   HGB 12.7 04/19/2017   HCT 38.0 04/19/2017   PLT 318.0 04/19/2017   GLUCOSE 89 04/19/2017   CHOL 199 04/19/2017   TRIG 69.0 04/19/2017   HDL 59.20 04/19/2017   LDLCALC 126 (H) 04/19/2017   ALT 15 04/19/2017   AST 16 04/19/2017   NA 144 04/19/2017   K 3.8 04/19/2017   CL 106 04/19/2017   CREATININE 0.82 04/19/2017   BUN 12 04/19/2017   CO2 32 04/19/2017   TSH 0.95 04/19/2017   HGBA1C 5.5 12/15/2013   Echo summary - may 2018 reviewed    Assessment & Plan:

## 2017-09-04 NOTE — Assessment & Plan Note (Signed)
Has initial eval with neurology next monday

## 2017-09-04 NOTE — Assessment & Plan Note (Addendum)
Likely cough related vs vasovagal/GI, for tess perle prn, also for Head CT due to 2 falls with persistent pain today (o/w neuro exam no change), doubt arrythmia - declines ecg, but will order carotids as well, and labs as documented  Note:  Total time for pt hx, exam, review of record with pt in the room, determination of diagnoses and plan for further eval and tx is > 40 min, with over 50% spent in coordination and counseling of patient including the differential dx, tx, further evaluation and other management of syncope, cough, constipation and headache after fall

## 2017-09-04 NOTE — Assessment & Plan Note (Signed)
Doubt obstruction by exam, for lactulose prn

## 2017-09-04 NOTE — Patient Instructions (Signed)
Please take all new medication as prescribed - the lactulose for constipation, and the tessalon perles for cough as needed  You will be contacted regarding the referral for: Head CT (today), as well as Carotid ultrasound to check for blockages  Please continue all other medications as before, and refills have been done if requested.  Please have the pharmacy call with any other refills you may need.  Please keep your appointments with your specialists as you may have planned  Please go to the LAB in the Basement (turn left off the elevator) for the tests to be done today  You will be contacted by phone if any changes need to be made immediately.  Otherwise, you will receive a letter about your results with an explanation, but please check with MyChart first.  Please remember to sign up for MyChart if you have not done so, as this will be important to you in the future with finding out test results, communicating by private email, and scheduling acute appointments online when needed.

## 2017-09-05 ENCOUNTER — Encounter: Payer: Self-pay | Admitting: Family Medicine

## 2017-09-06 NOTE — Progress Notes (Signed)
[name] was seen today in neurologic consultation at the request of Biagio Borg, MD.  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  The records that were made available to me were reviewed.  Pt describes "clawing" of the right hand x 6 months.  She states that she will wake up in the morning and her hand will be stuck.  It can happen anytime during the day.  It is not painful.  She pulls the fingers out and it will go out straight.    She does have neck pain but that can be independent of the right hand.  When the right hand claws and flexes it only lasts 2-3 seconds and then stops.  It was happening daily but now maybe every other day.  She notes nothing that will initiate it.  She notes no symptoms of the left hand.  She notes no falls or trouble ambulating.  Bladder and bowel are under good control.  She has no speech trouble.  She denies swallowing trouble.  She states that a week ago she had an event where she woke up in the middle of the night as she was hot and she went to the bathroom and put water on her face.  Her son her a "boom" on the bathroom floor.  He told her that she wasn't shaking but her arm was flexed up on the right (she was laying on the L hand).  He put ice on her and she woke up and threw up and then she went back to bed.  She did lose bladder control.  She did not lose bowel control.  She is on VPA but that is for bipolar.  Admits to frequent HA but taking daily [facility]   Neuroimaging has previously been performed.  It is available for my review today.  X rays of cervical spine last done in 2014 but no advanced imaging.  CT brain last in 09/04/17 and was negative.  This was without contrast.  PREVIOUS MEDICATIONS: n/a  ALLERGIES:   Allergies  Allergen Reactions  . Sulfa Antibiotics Nausea And Vomiting  . Sulfonamide Derivatives Nausea And Vomiting    REACTION: n/v    CURRENT MEDICATIONS:  Outpatient Encounter  Prescriptions as of 09/09/2017  Medication Sig  . albuterol (PROVENTIL) (2.5 MG/3ML) 0.083% nebulizer solution Take 2.5 mg by nebulization every 4 (four) hours as needed for wheezing or shortness of breath. Reported on 06/04/2016  . albuterol (VENTOLIN HFA) 108 (90 Base) MCG/ACT inhaler One to two puffs every 4 to 6 hours as needed  . amoxicillin-clavulanate (AUGMENTIN) 875-125 MG tablet Take 1 tablet by mouth 2 (two) times daily.  Marland Kitchen aspirin 325 MG tablet Take 325 mg by mouth daily.  Marland Kitchen azelastine (ASTELIN) 0.1 % nasal spray USE 2 SPRAYS IN EACH NOSTRIL TWICE DAILY AS DIRECTED  . benzonatate (TESSALON PERLES) 100 MG capsule 1-2 tab by mouth every 6 hrs as needed for cough  . cetirizine (ZYRTEC) 10 MG tablet Take 1 tablet (10 mg total) by mouth daily.  . cholecalciferol (VITAMIN D) 1000 units tablet Take 1,000 Units by mouth daily.  . divalproex (DEPAKOTE ER) 500 MG 24 hr tablet Take 1 tablet (500 mg total) by mouth daily.  Marland Kitchen Fe Cbn-Fe Gluc-FA-B12-C-DSS (FERRALET 90) 90-1 MG TABS Take 1 tablet by mouth daily.  . fluticasone (FLONASE) 50 MCG/ACT nasal spray Place 2 sprays into both nostrils daily.  Marland Kitchen gabapentin (NEURONTIN) 100 MG capsule  TAKE 2 CAPSULES BY MOUTH AT BEDTIME  . GENERLAC 10 GM/15ML SOLN TAKE 45 TO 90 ML( 30-60 GM) BY MOUTH TWICE DAILY AS NEEDED  . hydrochlorothiazide (MICROZIDE) 12.5 MG capsule Take 1 capsule (12.5 mg total) by mouth daily.  . irbesartan-hydrochlorothiazide (AVALIDE) 150-12.5 MG tablet TAKE 1 TABLET BY MOUTH EVERY DAY  . levocetirizine (XYZAL) 5 MG tablet Take 1 tablet (5 mg total) by mouth daily as needed for allergies.  . meloxicam (MOBIC) 15 MG tablet Take 1 tablet (15 mg total) by mouth daily.  . montelukast (SINGULAIR) 10 MG tablet TAKE 1 TABLET(10 MG) BY MOUTH AT BEDTIME  . NONFORMULARY OR COMPOUNDED ITEM Estradiol 0.02 % 79ml prefilled applicator Sig: apply twice a week  . pantoprazole (PROTONIX) 40 MG tablet TAKE 1 TABLET BY MOUTH DAILY  . pantoprazole  (PROTONIX) 40 MG tablet TAKE 1 TABLET BY MOUTH DAILY  . pravastatin (PRAVACHOL) 40 MG tablet Take 1 tablet (40 mg total) by mouth daily.  . predniSONE (DELTASONE) 10 MG tablet Take 20 mg twice daily for 3 days, then 20 mg daily for 2 days and 10 mg on the last day.  Marland Kitchen QUEtiapine (SEROQUEL) 100 MG tablet Take 1 tablet (100 mg total) by mouth at bedtime.  . ranitidine (ZANTAC) 150 MG tablet Take 1 tablet (150 mg total) by mouth 2 (two) times daily as needed for heartburn.  . SYMBICORT 160-4.5 MCG/ACT inhaler INHALE 2 PUFFS INTO THE LUNGS TWICE DAILY  . tiotropium (SPIRIVA HANDIHALER) 18 MCG inhalation capsule Place 1 capsule (18 mcg total) into inhaler and inhale daily.  Marland Kitchen tiZANidine (ZANAFLEX) 4 MG tablet TAKE 1 TABLET(4 MG) BY MOUTH EVERY 6 HOURS AS NEEDED FOR MUSCLE SPASMS  . traMADol (ULTRAM) 50 MG tablet Take 1 tablet (50 mg total) by mouth every 6 (six) hours as needed. For pain.  Marland Kitchen triamcinolone cream (KENALOG) 0.1 % APPLY EXTERNALLY TO THE AFFECTED AREA TWICE DAILY  . umeclidinium bromide (INCRUSE ELLIPTA) 62.5 MCG/INH AEPB Inhale 1 puff into the lungs daily.  . VENTOLIN HFA 108 (90 Base) MCG/ACT inhaler INHALE 1 TO 2 PUFFS BY MOUTH EVERY 4 TO 6 HOURS AS NEEDED   Facility-Administered Encounter Medications as of 09/09/2017  Medication  . ipratropium-albuterol (DUONEB) 0.5-2.5 (3) MG/3ML nebulizer solution 3 mL  . omalizumab Arvid Right) injection 300 mg    PAST MEDICAL HISTORY:   Past Medical History:  Diagnosis Date  . ALLERGIC RHINITIS 05/12/2009   Qualifier: Diagnosis of  By: Lenn Cal Deborra Medina), Wynona Canes    . Anemia, iron deficiency 12/22/2014  . Angina   . Anxiety   . Arthritis   . Asthma   . ASTHMA 05/12/2009   Severe AFL (Spirometry 05/2009: pre-BD FEV1 0.87L 34% pred, post-BD FEV1 1.11L 44% pred) Volumes hyperinflated Decreased DLCO that does not fully correct to normal range for alveolar volume.     Marland Kitchen COPD 08/24/2009   Qualifier: Diagnosis of  By: Burnett Kanaris    . COPD (chronic  obstructive pulmonary disease) ([place])   . Depression   . Fibromyalgia 05/14/2014  . GERD (gastroesophageal reflux disease)   . Hyperlipidemia 04/20/2017  . HYPERTENSION 05/12/2009   Qualifier: Diagnosis of  By: Lenn Cal Deborra Medina), Wynona Canes    . Migraine   . Peripheral vascular disease ([place])   . Prediabetes 02/23/2014  . Urticaria     PAST SURGICAL HISTORY:   Past Surgical History:  Procedure Laterality Date  . ABDOMINAL HYSTERECTOMY    . BACK SURGERY    . COLONOSCOPY  12/20/2011  Procedure: COLONOSCOPY;  Surgeon: Landry Dyke, MD;  Location: [facility];  Service: Endoscopy;  Laterality: N/A;  . COLONOSCOPY  03/05/2012   Procedure: COLONOSCOPY;  Surgeon: Landry Dyke, MD;  Location: [facility];  Service: Endoscopy;  Laterality: N/A;  . DIAGNOSTIC LAPAROSCOPY    . HEMORRHOID SURGERY    . INCISE AND DRAIN ABCESS    . KIDNEY STONE SURGERY    . NECK SURGERY    . TOE SURGERY    . TUBAL LIGATION      SOCIAL HISTORY:   Social History   Social History  . Marital status: Divorced    Spouse name: N/A  . Number of children: N/A  . Years of education: N/A   Occupational History  . unemployed    Social History Main Topics  . Smoking status: Former Smoker    Packs/day: 0.50    Years: 36.00    Types: Cigarettes    Quit date: 04/05/2017  . Smokeless tobacco: Never Used     Comment: quit 02/2016 but started back --Pt states she is "an off and on smoker."    . Alcohol use No     Comment: drinks red wine occasionally  . Drug use: No     Comment: denies on 09/09/17  . Sexual activity: Yes    Birth control/ protection: Surgical   Other Topics Concern  . Not on file   Social History Narrative   Lives alone   Lives apt, 3 stories up; goes up and down stairs every day;    Generally climbs flights of stairs 2 times a day    FAMILY HISTORY:   Family Status  Relation Status  . Mother Deceased  . Father Deceased  . Sister Alive  . Brother Alive  . Sister Deceased  . Brother  Alive  . Sister Alive  . Sister Alive  . Sister Alive  . Sister Alive  . Brother Alive  . Child Alive  . Neg Hx (Not Specified)    ROS:  A complete 10 system review of systems was obtained and was unremarkable apart from what is mentioned above.  PHYSICAL EXAMINATION:   PT PLACED INTO EXAMINATION GOWN AND EXAMINED  VITALS:   Vitals:   09/09/17 0859  BP: 110/76  Pulse: 68  SpO2: 95%  Weight: 174 lb (78.9 kg)  Height: 5\' 3"  (1.6 m)    GEN:  Normal appears female in no acute distress.  Appears stated age. HEENT:  Normocephalic, atraumatic. The mucous membranes are moist. The superficial temporal arteries are without ropiness or tenderness. Cardiovascular: Regular rate and rhythm. Lungs: Clear to auscultation bilaterally. Neck/Heme: There are no carotid bruits noted bilaterally.  NEUROLOGICAL: Orientation:  The patient is alert and oriented x 3.  Fund of knowledge is appropriate.  Recent and remote memory intact.  Attention span and concentration normal.  Repeats and names without difficulty. Cranial nerves: There is good facial symmetry. The pupils are equal round and reactive to light bilaterally. Fundoscopic exam reveals clear disc margins bilaterally. Extraocular muscles are intact and visual fields are full to confrontational testing. Speech is fluent and clear. Soft palate rises symmetrically and there is no tongue deviation. Hearing is intact to conversational tone. Tone: Tone is good throughout. Sensation: Sensation is intact to light touch and pinprick throughout (facial, trunk, extremities). Vibration is intact at the bilateral big toe. There is no extinction with double simultaneous stimulation. There is no sensory dermatomal level identified. Coordination:  The patient has no  difficulty with RAM's or FNF bilaterally. Motor: Strength is 5/5 in the bilateral upper and lower extremities.  Shoulder shrug is equal and symmetric. There is no pronator drift.  There are no  fasciculations noted. DTR's: Deep tendon reflexes are 2/4 at the bilateral biceps, triceps, brachioradialis, patella and achilles.  Plantar responses are downgoing bilaterally. Gait and Station: The patient is able to ambulate without difficulty. The patient is able to heel toe walk without any difficulty. The patient is able to ambulate in a tandem fashion. The patient is able to stand in the Romberg position.   IMPRESSION/PLAN  1. Abnormal involuntary movements  -I had a long discussion with the patient today.  Told her that I am more concerned with focal seizure than dystonia.  These don't sound like dystonic events.  She also had an event that awoke her from sleep last week and her son found her on the floor with loss of bladder control and flexed posturing.  Upon awakening, she had emesis.  This also is suspicious for seizure (not focal, obviously, unless it generalized).  She will have an EEG.  If negative, she will have an ambulatory.  -she will have an MRI brain with seizure protocol  -discussed [place] driving laws.  She is not to be driving.  She expressed understanding  -pt is already on VPA but that is for bipolar per patient.  2.  Chronic daily headache  -discussed rebound.  She is to d/c the BC's.    3.  Further recommendations will follow the above.  Much greater than 50% of this visit was spent in counseling and coordinating care.  Total face to face time:  60 min    Cc:  Biagio Borg, MD

## 2017-09-09 ENCOUNTER — Ambulatory Visit (INDEPENDENT_AMBULATORY_CARE_PROVIDER_SITE_OTHER): Payer: Medicare Other | Admitting: Neurology

## 2017-09-09 ENCOUNTER — Telehealth: Payer: Self-pay | Admitting: Neurology

## 2017-09-09 ENCOUNTER — Encounter: Payer: Self-pay | Admitting: Neurology

## 2017-09-09 ENCOUNTER — Ambulatory Visit: Payer: Medicare Other | Admitting: Allergy & Immunology

## 2017-09-09 VITALS — BP 110/76 | HR 68 | Ht 63.0 in | Wt 174.0 lb

## 2017-09-09 DIAGNOSIS — R519 Headache, unspecified: Secondary | ICD-10-CM

## 2017-09-09 DIAGNOSIS — R51 Headache: Secondary | ICD-10-CM

## 2017-09-09 DIAGNOSIS — G40909 Epilepsy, unspecified, not intractable, without status epilepticus: Secondary | ICD-10-CM

## 2017-09-09 NOTE — Telephone Encounter (Signed)
-----   Message from [place], DO sent at 09/09/2017 12:31 PM EDT ----- Let pt know EEG looked good.  Needs ambulatory.  Still shouldn't drive

## 2017-09-09 NOTE — Patient Instructions (Signed)
1. We have sent a referral to [facility] for your MRI and they will call you directly to schedule your appt. They are located at [place]. If you need to contact them directly please call [phone].  2. We will perform EEG and 48 hour EEG.   3. No driving

## 2017-09-09 NOTE — Procedures (Signed)
TECHNICAL SUMMARY:  A multichannel referential and bipolar montage EEG using the standard international 10-20 system was performed on the patient described as awake.  The dominant background activity consists of 10 hertz activity seen most prominantly over the posterior head region.  The backgound activity is reactive to eye opening and closing procedures.  Low voltage fast (beta) activity is distributed symmetrically and maximally over the anterior head regions.  ACTIVATION:  Stepwise photic stimulation at 4-20 flashes per second was performed and did not elicit any abnormal waveforms.  Hyperventilation was not performed  EPILEPTIFORM ACTIVITY:  There were no spikes, sharp waves or paroxysmal activity.  SLEEP:  No sleep was noted  CARDIAC:  The EKG lead revealed a regular sinus rhythm.  IMPRESSION:  This is a normal EEG for the patients stated age.  There were no focal, hemispheric or lateralizing features.  No epileptiform activity was recorded.  A normal EEG does not exclude the diagnosis of a seizure disorder and if seizure remains high on the list of differential diagnosis, an ambulatory EEG may be of value.  Clinical correlation is required.

## 2017-09-09 NOTE — Telephone Encounter (Signed)
Mychart message sent to patient.

## 2017-09-11 ENCOUNTER — Other Ambulatory Visit: Payer: Self-pay | Admitting: Internal Medicine

## 2017-09-11 DIAGNOSIS — Z1231 Encounter for screening mammogram for malignant neoplasm of breast: Secondary | ICD-10-CM

## 2017-09-16 ENCOUNTER — Ambulatory Visit (INDEPENDENT_AMBULATORY_CARE_PROVIDER_SITE_OTHER): Payer: Medicare Other | Admitting: Neurology

## 2017-09-16 ENCOUNTER — Telehealth: Payer: Self-pay | Admitting: *Deleted

## 2017-09-16 ENCOUNTER — Ambulatory Visit: Payer: Medicare Other | Admitting: Women's Health

## 2017-09-16 DIAGNOSIS — G40909 Epilepsy, unspecified, not intractable, without status epilepticus: Secondary | ICD-10-CM

## 2017-09-16 MED ORDER — TRAMADOL HCL 50 MG PO TABS: 50.0000 mg | ORAL_TABLET | Freq: Four times a day (QID) | ORAL | 3 refills | Status: DC | PRN

## 2017-09-16 NOTE — Telephone Encounter (Signed)
Done hardcopy to [place]  

## 2017-09-16 NOTE — Telephone Encounter (Signed)
Rec'd call pt requesting refill on her Tramadol...[name]

## 2017-09-16 NOTE — Telephone Encounter (Signed)
Faxed

## 2017-09-18 ENCOUNTER — Ambulatory Visit: Payer: Medicare Other | Admitting: Women's Health

## 2017-09-21 ENCOUNTER — Other Ambulatory Visit: Payer: Self-pay | Admitting: Family Medicine

## 2017-09-24 ENCOUNTER — Ambulatory Visit
Admission: RE | Admit: 2017-09-24 | Discharge: 2017-09-24 | Disposition: A | Discharge status: Home / Self Care | Payer: Medicare Other | Source: Ambulatory Visit | Attending: Neurology | Admitting: Neurology

## 2017-09-24 DIAGNOSIS — R569 Unspecified convulsions: Secondary | ICD-10-CM | POA: Diagnosis not present

## 2017-09-24 DIAGNOSIS — G40909 Epilepsy, unspecified, not intractable, without status epilepticus: Secondary | ICD-10-CM

## 2017-09-24 MED ORDER — GADOBENATE DIMEGLUMINE 529 MG/ML IV SOLN
15.0000 mL | Freq: Once | INTRAVENOUS | Status: AC | PRN
  Administered 2017-09-24: 15 mL via INTRAVENOUS

## 2017-09-25 ENCOUNTER — Telehealth: Payer: Self-pay | Admitting: Neurology

## 2017-09-25 DIAGNOSIS — E237 Disorder of pituitary gland, unspecified: Secondary | ICD-10-CM

## 2017-09-25 NOTE — Telephone Encounter (Signed)
-----   Message from [place], DO sent at 09/24/2017  5:48 PM EDT ----- Mild to mod small vessel disease but multiple risk factors with HTN, Hyperlipid, migraine, asthma, PVD.  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  Luvenia Starch, if agreeable refer to endocrinology to make sure no pituitary hyperfunction.

## 2017-09-25 NOTE — Telephone Encounter (Signed)
Left message on machine for patient to call back.

## 2017-09-26 NOTE — Telephone Encounter (Signed)
Patient made aware of results. Referral sent to Endocrinology and they will call patient to schedule.

## 2017-09-26 NOTE — Telephone Encounter (Signed)
Patient returned your call.  Thanks!

## 2017-09-27 ENCOUNTER — Telehealth: Payer: Self-pay | Admitting: Neurology

## 2017-09-27 NOTE — Telephone Encounter (Signed)
Author: Ludwig Clarks, DO Service: (none) Author Type: Physician  Filed: 09/27/2017 11:55 AM Encounter Date: 09/16/2017 Status: Signed  Editor: Tat, Eustace Quail, DO (Physician)    Let pt know EEG is good.  Please have her come in on 1024/18 at 8:15 for appt     Spoke with patient. Appt made.

## 2017-09-27 NOTE — Procedures (Signed)
ELECTROENCEPHALOGRAM REPORT  Dates of Recording: 09/16/2017 13:33AM to 09/18/2017 10:36AM  Patient's Name: [name] MRN: [id number] Date of Birth: [birth year]  Referring Provider: Dr. Wells Guiles Tat  Procedure: 48-hour ambulatory EEG  History: This is a 56 year old woman abnormal involuntary movements, EEG for classification  Medications:  DEPAKOTE ER 500 MG NEURONTIN 100 MG capsule  PROVENTIL (2.5 MG/3ML) 0.083% nebulizer solution  VENTOLIN HFA 108 (90 Base) MCG/ACT inhaler  AUGMENTIN 875-125 MG tablet  aspirin 325 MG tablet ASTELIN 0.1 % nasal spray  TESSALON PERLES 100 MG capsule ZYRTEC 10 MG tablet VITAMIN D 1000 units tablet  FERRALET 90 90-1 MG TABS  FLONASE 50 MCG/ACT nasal spray  GENERLAC 10 GM/15ML SOLN  MICROZIDE 12.5 MG capsule  AVALIDE 150-12.5 MG tablet  XYZAL 5 MG tablet MOBIC 15 MG tablet SINGULAIR 10 MG tablet  NONFORMULARY OR COMPOUNDED ITEM Estradiol 0.02 % PROTONIX 40 MG tablet PRAVACHOL 40 MG tablet  DELTASONE 10 MG tablet SEROQUEL 100 MG tablet ZANTAC 150 MG tablet SYMBICORT 160-4.5 MCG/ACT inhaler  SPIRIVA HANDIHALER 18 MCG inhalation capsule Place 1 capsule (18 mcg total) into inhaler and inhale daily. ZANAFLEX 4 MG tablet   ULTRAM 50 MG  KENALOG  INCRUSE ELLIPTA 62.5 MCG/INH AEPB  VENTOLIN HFA 108 (90 Base) MCG/ACT inhaler  Technical Summary: This is a 48-hour multichannel digital EEG recording measured by the international 10-20 system with electrodes applied with paste and impedances below 5000 ohms performed as portable with EKG monitoring.  The digital EEG was referentially recorded, reformatted, and digitally filtered in a variety of bipolar and referential montages for optimal display.    DESCRIPTION OF RECORDING: During maximal wakefulness, the background activity consisted of a symmetric 10.5 Hz posterior dominant rhythm which was reactive to eye opening.  There were no epileptiform discharges or focal slowing seen in  wakefulness.  During the recording, the patient progresses through wakefulness, drowsiness, and Stage 2 sleep.  Again, there were no epileptiform discharges seen.  Events: On 09/16/17 at 1726 hours, she reports "hand and foot (toes)." She is not on video, then a minute later seen vacuuming. Electrographically, there were no EEG or EKG changes seen.  On 09/16/17 at 1834 hours, she reports "toes" and "back." She is not on video. Electrographically, there were no EEG or EKG changes seen.  On 09/16/17 at 2330 hours, she reports pain above knee, arm, head. Electrographically, there were no EEG or EKG changes seen.  On 09/17/17 at 0906 hours, she reports chest pain. Electrographically, there were no EEG or EKG changes seen.  On 09/17/17 at 2026 hours, she reports "fingers." Her left hand is visible on video, no changes seen. Electrographically, there were no EEG or EKG changes seen.  On 09/19/17 at 0121 hours, she is sitting on the couch holding phone with left hand. She appears to be looking at phone but there is no eye blink on EEG. She notes "fingers" with no clear changes seen on video. Electrographically, there were no EEG or EKG changes seen. Drowsy and poorly formed vertex waves are seen around this time when she is sitting up holding phone, then arousal pattern noted as she reports symptoms.  On 09/18/17 at 0721 hours, she reports "fingers." No clear changes seen on video. Electrographically, there were no EEG or EKG changes seen.  On 09/18/17 at 0830 hours, she reports leg pain. Electrographically, there were no EEG or EKG changes seen.   There were no electrographic seizures seen.  EKG lead was unremarkable.  IMPRESSION:  This 48-hour ambulatory EEG study is normal.    CLINICAL CORRELATION: A normal EEG does not exclude a clinical diagnosis of epilepsy. Episodes where patient reports "fingers" and "hand and foot" do not show any epileptiform correlate. No EEG changes seen with reports of body  pain. If further clinical questions remain, inpatient video EEG monitoring may be helpful.   Ellouise Newer, M.D.

## 2017-09-27 NOTE — Progress Notes (Signed)
Let pt know EEG is good.  Please have her come in on 1024/18 at 8:15 for appt

## 2017-09-27 NOTE — Telephone Encounter (Signed)
-----   Message from [place], DO sent at 09/27/2017 11:55 AM EDT -----   ----- Message ----- From: Ludwig Clarks, DO Sent: 09/27/2017  10:02 AM To: Biagio Borg, MD, Eustace Quail Tat, DO

## 2017-10-01 ENCOUNTER — Ambulatory Visit: Payer: Self-pay

## 2017-10-03 ENCOUNTER — Ambulatory Visit (INDEPENDENT_AMBULATORY_CARE_PROVIDER_SITE_OTHER): Payer: Medicare Other | Admitting: *Deleted

## 2017-10-03 DIAGNOSIS — J4540 Moderate persistent asthma, uncomplicated: Secondary | ICD-10-CM | POA: Diagnosis not present

## 2017-10-03 DIAGNOSIS — J45901 Unspecified asthma with (acute) exacerbation: Secondary | ICD-10-CM

## 2017-10-07 NOTE — Progress Notes (Signed)
[name] was seen today in neurologic consultation at the request of Biagio Borg, MD.  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  The records that were made available to me were reviewed.  Pt describes "clawing" of the right hand x 6 months.  She states that she will wake up in the morning and her hand will be stuck.  It can happen anytime during the day.  It is not painful.  She pulls the fingers out and it will go out straight.    She does have neck pain but that can be independent of the right hand.  When the right hand claws and flexes it only lasts 2-3 seconds and then stops.  It was happening daily but now maybe every other day.  She notes nothing that will initiate it.  She notes no symptoms of the left hand.  She notes no falls or trouble ambulating.  Bladder and bowel are under good control.  She has no speech trouble.  She denies swallowing trouble.  She states that a week ago she had an event where she woke up in the middle of the night as she was hot and she went to the bathroom and put water on her face.  Her son her a "boom" on the bathroom floor.  He told her that she wasn't shaking but her arm was flexed up on the right (she was laying on the L hand).  He put ice on her and she woke up and threw up and then she went back to bed.  She did lose bladder control.  She did not lose bowel control.  She is on VPA but that is for bipolar.  Admits to frequent HA but taking daily [facility]   Neuroimaging has previously been performed.  It is available for my review today.  X rays of cervical spine last done in 2014 but no advanced imaging.  CT brain last in 09/04/17 and was negative.  This was without contrast.  10/09/17 update: Patient is seen today in follow-up.  She has had both a routine and a 48-hour ambulatory EEG since our last visit.  Both of these were normal.  She has had no syncopal spells and no spells where she was just found awakening  in the unusual places.  In regards to clawing of the hand on the right, she states that "it happens every now and then but not like it was."  It might happen one time a week and last a few min max.  No further syncopal episodes/awakening in strange places but "I quit taking my night pills."  She doesn't know what her night pills were.  I asked her if that was her seroquel and she says that "it may be."     she had an MRI of the brain since last visit I had the opportunity to review.  This demonstrated mild to mod small vessel disease but multiple risk factors with HTN, Hyperlipid, migraine, asthma, PVD. 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 We referred her to endocrinology to make sure that there was no pituitary hyperfunction.  That appointment is at 10 AM today.  In regards to headache, she states that headaches are frontal bilateral.  She did try to decrease BC's.  They are pressure.  They come in the AM and at night.  When asked how long they last she states "until I take something."  She has  now substituted ibuprofen for BC's.  PREVIOUS MEDICATIONS: n/a  ALLERGIES:   Allergies  Allergen Reactions  . Sulfa Antibiotics Nausea And Vomiting  . Sulfonamide Derivatives Nausea And Vomiting    REACTION: n/v    CURRENT MEDICATIONS:  Outpatient Encounter Prescriptions as of 10/09/2017  Medication Sig  . albuterol (PROVENTIL) (2.5 MG/3ML) 0.083% nebulizer solution Take 2.5 mg by nebulization every 4 (four) hours as needed for wheezing or shortness of breath. Reported on 06/04/2016  . albuterol (VENTOLIN HFA) 108 (90 Base) MCG/ACT inhaler One to two puffs every 4 to 6 hours as needed  . aspirin 325 MG tablet Take 325 mg by mouth daily.  Marland Kitchen azelastine (ASTELIN) 0.1 % nasal spray USE 2 SPRAYS IN EACH NOSTRIL TWICE DAILY AS DIRECTED  . benzonatate (TESSALON PERLES) 100 MG capsule 1-2 tab by mouth every 6 hrs as needed for cough  . cetirizine (ZYRTEC) 10 MG tablet Take 1 tablet (10 mg total)  by mouth daily.  . cholecalciferol (VITAMIN D) 1000 units tablet Take 1,000 Units by mouth daily.  . divalproex (DEPAKOTE ER) 500 MG 24 hr tablet Take 1 tablet (500 mg total) by mouth daily.  Marland Kitchen Fe Cbn-Fe Gluc-FA-B12-C-DSS (FERRALET 90) 90-1 MG TABS Take 1 tablet by mouth daily.  . fluticasone (FLONASE) 50 MCG/ACT nasal spray Place 2 sprays into both nostrils daily.  Marland Kitchen gabapentin (NEURONTIN) 100 MG capsule TAKE 2 CAPSULES BY MOUTH AT BEDTIME  . GENERLAC 10 GM/15ML SOLN TAKE 45 TO 90 ML( 30-60 GM) BY MOUTH TWICE DAILY AS NEEDED  . hydrochlorothiazide (MICROZIDE) 12.5 MG capsule Take 1 capsule (12.5 mg total) by mouth daily.  . irbesartan-hydrochlorothiazide (AVALIDE) 150-12.5 MG tablet TAKE 1 TABLET BY MOUTH EVERY DAY  . levocetirizine (XYZAL) 5 MG tablet Take 1 tablet (5 mg total) by mouth daily as needed for allergies.  . meloxicam (MOBIC) 15 MG tablet Take 1 tablet (15 mg total) by mouth daily.  . montelukast (SINGULAIR) 10 MG tablet TAKE 1 TABLET(10 MG) BY MOUTH AT BEDTIME  . NONFORMULARY OR COMPOUNDED ITEM Estradiol 0.02 % 45ml prefilled applicator Sig: apply twice a week  . pantoprazole (PROTONIX) 40 MG tablet TAKE 1 TABLET BY MOUTH DAILY  . pravastatin (PRAVACHOL) 40 MG tablet Take 1 tablet (40 mg total) by mouth daily.  . predniSONE (DELTASONE) 10 MG tablet Take 20 mg twice daily for 3 days, then 20 mg daily for 2 days and 10 mg on the last day.  Marland Kitchen QUEtiapine (SEROQUEL) 100 MG tablet Take 1 tablet (100 mg total) by mouth at bedtime.  . ranitidine (ZANTAC) 150 MG tablet Take 1 tablet (150 mg total) by mouth 2 (two) times daily as needed for heartburn.  . SYMBICORT 160-4.5 MCG/ACT inhaler INHALE 2 PUFFS INTO THE LUNGS TWICE DAILY  . tiotropium (SPIRIVA HANDIHALER) 18 MCG inhalation capsule Place 1 capsule (18 mcg total) into inhaler and inhale daily.  Marland Kitchen tiZANidine (ZANAFLEX) 4 MG tablet TAKE 1 TABLET(4 MG) BY MOUTH EVERY 6 HOURS AS NEEDED FOR MUSCLE SPASMS  . traMADol (ULTRAM) 50 MG tablet  Take 1 tablet (50 mg total) by mouth every 6 (six) hours as needed. For pain.  Marland Kitchen triamcinolone cream (KENALOG) 0.1 % APPLY EXTERNALLY TO THE AFFECTED AREA TWICE DAILY  . umeclidinium bromide (INCRUSE ELLIPTA) 62.5 MCG/INH AEPB Inhale 1 puff into the lungs daily.  . VENTOLIN HFA 108 (90 Base) MCG/ACT inhaler INHALE 1 TO 2 PUFFS BY MOUTH EVERY 4 TO 6 HOURS AS NEEDED  . levETIRAcetam (KEPPRA) 500 MG tablet  Take 1 tablet (500 mg total) by mouth 2 (two) times daily.   Facility-Administered Encounter Medications as of 10/09/2017  Medication  . ipratropium-albuterol (DUONEB) 0.5-2.5 (3) MG/3ML nebulizer solution 3 mL  . omalizumab Arvid Right) injection 300 mg    PAST MEDICAL HISTORY:   Past Medical History:  Diagnosis Date  . ALLERGIC RHINITIS 05/12/2009   Qualifier: Diagnosis of  By: Lenn Cal Deborra Medina), Wynona Canes    . Anemia, iron deficiency 12/22/2014  . Angina   . Anxiety   . Arthritis   . Asthma   . ASTHMA 05/12/2009   Severe AFL (Spirometry 05/2009: pre-BD FEV1 0.87L 34% pred, post-BD FEV1 1.11L 44% pred) Volumes hyperinflated Decreased DLCO that does not fully correct to normal range for alveolar volume.     Marland Kitchen COPD 08/24/2009   Qualifier: Diagnosis of  By: Burnett Kanaris    . COPD (chronic obstructive pulmonary disease) ([place])   . Depression   . Fibromyalgia 05/14/2014  . GERD (gastroesophageal reflux disease)   . Hyperlipidemia 04/20/2017  . HYPERTENSION 05/12/2009   Qualifier: Diagnosis of  By: Lenn Cal Deborra Medina), Wynona Canes    . Migraine   . Nephrolithiasis   . Peripheral vascular disease ([place])   . Prediabetes 02/23/2014  . Urticaria     PAST SURGICAL HISTORY:   Past Surgical History:  Procedure Laterality Date  . ABDOMINAL HYSTERECTOMY    . BACK SURGERY    . COLONOSCOPY  12/20/2011   Procedure: COLONOSCOPY;  Surgeon: Landry Dyke, MD;  Location: [facility];  Service: Endoscopy;  Laterality: N/A;  . COLONOSCOPY  03/05/2012   Procedure: COLONOSCOPY;  Surgeon: Landry Dyke, MD;   Location: [facility];  Service: Endoscopy;  Laterality: N/A;  . DIAGNOSTIC LAPAROSCOPY    . HEMORRHOID SURGERY    . INCISE AND DRAIN ABCESS    . KIDNEY STONE SURGERY    . NECK SURGERY    . TOE SURGERY    . TUBAL LIGATION      SOCIAL HISTORY:   Social History   Social History  . Marital status: Divorced    Spouse name: N/A  . Number of children: N/A  . Years of education: N/A   Occupational History  . unemployed    Social History Main Topics  . Smoking status: Former Smoker    Packs/day: 0.50    Years: 36.00    Types: Cigarettes    Quit date: 04/05/2017  . Smokeless tobacco: Never Used     Comment: quit 02/2016 but started back --Pt states she is "an off and on smoker."    . Alcohol use No     Comment: drinks red wine occasionally  . Drug use: No     Comment: denies on 09/09/17  . Sexual activity: Yes    Birth control/ protection: Surgical   Other Topics Concern  . Not on file   Social History Narrative   Lives alone   Lives apt, 3 stories up; goes up and down stairs every day;    Generally climbs flights of stairs 2 times a day    FAMILY HISTORY:   Family Status  Relation Status  . Mother Deceased  . Father Deceased  . Sister Alive  . Brother Alive  . Sister Deceased  . Brother Alive  . Sister Alive  . Sister Alive  . Sister Alive  . Sister Alive  . Brother Alive  . Child Alive  . Neg Hx (Not Specified)    ROS:  A complete 10 system review  of systems was obtained and was unremarkable apart from what is mentioned above.  PHYSICAL EXAMINATION:    VITALS:   Vitals:   10/09/17 0803  BP: 138/80  Pulse: 76  SpO2: 93%  Weight: 175 lb (79.4 kg)  Height: 5\' 3"  (1.6 m)    GEN:  Normal appears female in no acute distress.  Appears stated age. HEENT:  Normocephalic, atraumatic. The mucous membranes are moist. The superficial temporal arteries are without ropiness or tenderness. Cardiovascular: Regular rate and rhythm. Lungs: Clear to auscultation  bilaterally. Neck/Heme: There are no carotid bruits noted bilaterally.  NEUROLOGICAL: Orientation:  The patient is alert and oriented x 3.   Cranial nerves: There is good facial symmetry. The pupils are equal round and reactive to light bilaterally. Fundoscopic exam reveals clear disc margins bilaterally. Extraocular muscles are intact and visual fields are full to confrontational testing. Speech is fluent and clear. Soft palate rises symmetrically and there is no tongue deviation. Hearing is intact to conversational tone. Tone: Tone is good throughout. Sensation: Sensation is intact to light touch throughout Coordination:  The patient has no difficulty with RAM's or FNF bilaterally. Motor: Strength is 5/5 in the bilateral upper and lower extremities.   Gait and Station: The patient is able to ambulate without difficulty.    IMPRESSION/PLAN  1. Abnormal involuntary movements, possibly seizure  -I had a long discussion with the patient today.  Told her that I am more concerned with focal seizure than dystonia.  These don't sound like dystonic events.  She also had an event that awoke her from where her son found her on the floor with loss of bladder control and flexed posturing.  Upon awakening, she had emesis.  This also is suspicious for seizure (not focal, obviously, unless it generalized).  Despite the fact that her EEGs were negative, I would like to try really do not want to alter that medication since it is prescribed by a different physician for something else.  We discussed Keppra versus Topamax but then she revealed a hx of nephrolithiasis to me and we decided to not do topamax.  We will try keppra.  Risks, benefits, side effects and alternative therapies were discussed.  The opportunity to ask questions was given and they were answered to the best of my ability.  The patient expressed understanding and willingness to follow the outlined treatment protocols.  -reviewed MRI brain with her  and showed her films.  Has small vessel disease.  Discussed control of BP and cholesterol  -discussed [place] driving laws.    -It was noted since our last visit that the patient called her primary care physician on September 16, 2017 for tramadol.  She denied talked about the fact that tramadol can lower seizure threshold.  I would not recommend that she take this medication given that I am suspicious that her events could have been seizure, even though her EEG is negative.  Pt reports on it for hip pain and told her to call PCP for something else.  2.  Chronic daily headache  -discussed rebound.  She is to d/c the ibupfrofen.  Keppra may help but biggest issue is stopping the rebound phenomenon  3.  Rathkes cleft cyst  -told her incidental finding.  Has appt later today with endocrine to make sure no pituatary hyperfunction  4.  Follow up is anticipated in the next few months, sooner should new neurologic issues arise.  Much greater than 50% of this visit was spent in  counseling and coordinating care.  Total face to face time:  25 min    Cc:  Biagio Borg, MD

## 2017-10-08 ENCOUNTER — Ambulatory Visit: Payer: Self-pay

## 2017-10-09 ENCOUNTER — Encounter: Payer: Self-pay | Admitting: Endocrinology

## 2017-10-09 ENCOUNTER — Ambulatory Visit (INDEPENDENT_AMBULATORY_CARE_PROVIDER_SITE_OTHER): Payer: Medicare Other | Admitting: Endocrinology

## 2017-10-09 ENCOUNTER — Other Ambulatory Visit: Payer: Self-pay

## 2017-10-09 ENCOUNTER — Ambulatory Visit (INDEPENDENT_AMBULATORY_CARE_PROVIDER_SITE_OTHER): Payer: Medicare Other | Admitting: Neurology

## 2017-10-09 ENCOUNTER — Encounter: Payer: Self-pay | Admitting: Neurology

## 2017-10-09 VITALS — BP 138/80 | HR 76 | Ht 63.0 in | Wt 175.0 lb

## 2017-10-09 DIAGNOSIS — R9089 Other abnormal findings on diagnostic imaging of central nervous system: Secondary | ICD-10-CM | POA: Diagnosis not present

## 2017-10-09 DIAGNOSIS — G40909 Epilepsy, unspecified, not intractable, without status epilepticus: Secondary | ICD-10-CM | POA: Diagnosis not present

## 2017-10-09 DIAGNOSIS — R519 Headache, unspecified: Secondary | ICD-10-CM

## 2017-10-09 DIAGNOSIS — R51 Headache: Secondary | ICD-10-CM | POA: Diagnosis not present

## 2017-10-09 DIAGNOSIS — E236 Other disorders of pituitary gland: Secondary | ICD-10-CM

## 2017-10-09 DIAGNOSIS — Z23 Encounter for immunization: Secondary | ICD-10-CM

## 2017-10-09 MED ORDER — DEXAMETHASONE 1 MG PO TABS: ORAL_TABLET | ORAL | 0 refills | Status: DC

## 2017-10-09 MED ORDER — LEVETIRACETAM 500 MG PO TABS: 500.0000 mg | ORAL_TABLET | Freq: Two times a day (BID) | ORAL | 1 refills | Status: DC

## 2017-10-09 NOTE — Progress Notes (Signed)
Subjective:    Patient ID: [name], female    DOB: [birth year], 56 y.o.   MRN: [id number]  HPI Ref by Dr Tat.  In early Oct of 2018, pt had MRI of the pituitary.  She was incidentally noted to have a nodule in the pituitary.  She has no h/o pituitary problems.  She has slight cramping of the hands, and assoc headache. She had TAH in 2000.  Past Medical History:  Diagnosis Date  . ALLERGIC RHINITIS 05/12/2009   Qualifier: Diagnosis of  By: Lenn Cal Deborra Medina), Wynona Canes    . Anemia, iron deficiency 12/22/2014  . Angina   . Anxiety   . Arthritis   . Asthma   . ASTHMA 05/12/2009   Severe AFL (Spirometry 05/2009: pre-BD FEV1 0.87L 34% pred, post-BD FEV1 1.11L 44% pred) Volumes hyperinflated Decreased DLCO that does not fully correct to normal range for alveolar volume.     Marland Kitchen COPD 08/24/2009   Qualifier: Diagnosis of  By: Burnett Kanaris    . COPD (chronic obstructive pulmonary disease) ([place])   . Depression   . Fibromyalgia 05/14/2014  . GERD (gastroesophageal reflux disease)   . Hyperlipidemia 04/20/2017  . HYPERTENSION 05/12/2009   Qualifier: Diagnosis of  By: Lenn Cal Deborra Medina), Wynona Canes    . Migraine   . Nephrolithiasis   . Peripheral vascular disease ([place])   . Prediabetes 02/23/2014  . Urticaria     Past Surgical History:  Procedure Laterality Date  . ABDOMINAL HYSTERECTOMY    . BACK SURGERY    . COLONOSCOPY  12/20/2011   Procedure: COLONOSCOPY;  Surgeon: Landry Dyke, MD;  Location: [facility];  Service: Endoscopy;  Laterality: N/A;  . COLONOSCOPY  03/05/2012   Procedure: COLONOSCOPY;  Surgeon: Landry Dyke, MD;  Location: [facility];  Service: Endoscopy;  Laterality: N/A;  . DIAGNOSTIC LAPAROSCOPY    . HEMORRHOID SURGERY    . INCISE AND DRAIN ABCESS    . KIDNEY STONE SURGERY    . NECK SURGERY    . TOE SURGERY    . TUBAL LIGATION      Social History   Social History  . Marital status: Divorced    Spouse name: N/A  . Number of children: N/A  . Years of  education: N/A   Occupational History  . unemployed    Social History Main Topics  . Smoking status: Former Smoker    Packs/day: 0.50    Years: 36.00    Types: Cigarettes    Quit date: 04/05/2017  . Smokeless tobacco: Never Used     Comment: quit 02/2016 but started back --Pt states she is "an off and on smoker."    . Alcohol use No     Comment: drinks red wine occasionally  . Drug use: No     Comment: denies on 09/09/17  . Sexual activity: Yes    Birth control/ protection: Surgical   Other Topics Concern  . Not on file   Social History Narrative   Lives alone   Lives apt, 3 stories up; goes up and down stairs every day;    Generally climbs flights of stairs 2 times a day    Current Outpatient Prescriptions on File Prior to Visit  Medication Sig Dispense Refill  . albuterol (PROVENTIL) (2.5 MG/3ML) 0.083% nebulizer solution Take 2.5 mg by nebulization every 4 (four) hours as needed for wheezing or shortness of breath. Reported on 06/04/2016    . albuterol (VENTOLIN HFA) 108 (90 Base) MCG/ACT inhaler  One to two puffs every 4 to 6 hours as needed 1 Inhaler 5  . aspirin 325 MG tablet Take 325 mg by mouth daily.    Marland Kitchen azelastine (ASTELIN) 0.1 % nasal spray USE 2 SPRAYS IN EACH NOSTRIL TWICE DAILY AS DIRECTED 30 mL 5  . benzonatate (TESSALON PERLES) 100 MG capsule 1-2 tab by mouth every 6 hrs as needed for cough 60 capsule 0  . cetirizine (ZYRTEC) 10 MG tablet Take 1 tablet (10 mg total) by mouth daily. 30 tablet 2  . cholecalciferol (VITAMIN D) 1000 units tablet Take 1,000 Units by mouth daily.    . divalproex (DEPAKOTE ER) 500 MG 24 hr tablet Take 1 tablet (500 mg total) by mouth daily. 30 tablet 2  . Fe Cbn-Fe Gluc-FA-B12-C-DSS (FERRALET 90) 90-1 MG TABS Take 1 tablet by mouth daily. 90 each 1  . fluticasone (FLONASE) 50 MCG/ACT nasal spray Place 2 sprays into both nostrils daily. 16 g 5  . gabapentin (NEURONTIN) 100 MG capsule TAKE 2 CAPSULES BY MOUTH AT BEDTIME 180 capsule 1  .  GENERLAC 10 GM/15ML SOLN TAKE 45 TO 90 ML( 30-60 GM) BY MOUTH TWICE DAILY AS NEEDED 00938 mL 1  . hydrochlorothiazide (MICROZIDE) 12.5 MG capsule Take 1 capsule (12.5 mg total) by mouth daily. 30 capsule 11  . irbesartan-hydrochlorothiazide (AVALIDE) 150-12.5 MG tablet TAKE 1 TABLET BY MOUTH EVERY DAY 90 tablet 0  . levETIRAcetam (KEPPRA) 500 MG tablet Take 1 tablet (500 mg total) by mouth 2 (two) times daily. 180 tablet 1  . levocetirizine (XYZAL) 5 MG tablet Take 1 tablet (5 mg total) by mouth daily as needed for allergies. 30 tablet 5  . meloxicam (MOBIC) 15 MG tablet Take 1 tablet (15 mg total) by mouth daily. 90 tablet 3  . montelukast (SINGULAIR) 10 MG tablet TAKE 1 TABLET(10 MG) BY MOUTH AT BEDTIME 90 tablet 0  . NONFORMULARY OR COMPOUNDED ITEM Estradiol 0.02 % 54ml prefilled applicator Sig: apply twice a week 24 each 4  . pantoprazole (PROTONIX) 40 MG tablet TAKE 1 TABLET BY MOUTH DAILY 30 tablet 0  . pravastatin (PRAVACHOL) 40 MG tablet Take 1 tablet (40 mg total) by mouth daily. 90 tablet 3  . QUEtiapine (SEROQUEL) 100 MG tablet Take 1 tablet (100 mg total) by mouth at bedtime. 30 tablet 2  . ranitidine (ZANTAC) 150 MG tablet Take 1 tablet (150 mg total) by mouth 2 (two) times daily as needed for heartburn. 60 tablet 2  . SYMBICORT 160-4.5 MCG/ACT inhaler INHALE 2 PUFFS INTO THE LUNGS TWICE DAILY 10.2 g 0  . tiotropium (SPIRIVA HANDIHALER) 18 MCG inhalation capsule Place 1 capsule (18 mcg total) into inhaler and inhale daily. 30 capsule 5  . tiZANidine (ZANAFLEX) 4 MG tablet TAKE 1 TABLET(4 MG) BY MOUTH EVERY 6 HOURS AS NEEDED FOR MUSCLE SPASMS 60 tablet 0  . traMADol (ULTRAM) 50 MG tablet Take 1 tablet (50 mg total) by mouth every 6 (six) hours as needed. For pain. 30 tablet 3  . triamcinolone cream (KENALOG) 0.1 % APPLY EXTERNALLY TO THE AFFECTED AREA TWICE DAILY 30 g 0  . umeclidinium bromide (INCRUSE ELLIPTA) 62.5 MCG/INH AEPB Inhale 1 puff into the lungs daily. 30 each 5  . VENTOLIN  HFA 108 (90 Base) MCG/ACT inhaler INHALE 1 TO 2 PUFFS BY MOUTH EVERY 4 TO 6 HOURS AS NEEDED 18 g 0   Current Facility-Administered Medications on File Prior to Visit  Medication Dose Route Frequency Provider Last Rate Last Dose  .  ipratropium-albuterol (DUONEB) 0.5-2.5 (3) MG/3ML nebulizer solution 3 mL  3 mL Nebulization Once Bobbitt, [place] Muta, MD      . omalizumab Arvid Right) injection 300 mg  300 mg Subcutaneous Q28 days Valentina Shaggy, MD   300 mg at 10/03/17 1121    Allergies  Allergen Reactions  . Sulfa Antibiotics Nausea And Vomiting  . Sulfonamide Derivatives Nausea And Vomiting    REACTION: n/v    Family History  Problem Relation Age of Onset  . Diabetes Mother   . COPD Mother   . Heart disease Mother   . Asthma Mother   . Diabetes Father   . Kidney disease Father   . Cancer Father   . Anesthesia problems Father   . Alcohol abuse Father   . Diabetes Sister   . Hypertension Sister   . Diabetes Brother   . Sleep apnea Brother   . Asthma Brother   . Alcohol abuse Brother   . Heart disease Sister   . Diabetes Sister   . Brain cancer Sister   . Heart disease Brother   . Asthma Sister   . Allergic rhinitis Neg Hx   . Eczema Neg Hx   . Urticaria Neg Hx     BP 140/76   Pulse 81   Wt 175 lb 12.8 oz (79.7 kg)   SpO2 98%   BMI 31.14 kg/m    Review of Systems denies loss of smell, rash, visual loss, galactorrhea, weight change, easy bruising, change in facial appearance, and n/v. She has frequent urination, rhinorrhea, depression, headache, and fatigue.  No further LOC since she saw Dr Tat     Objective:   Physical Exam VS: see vs page GEN: no distress HEAD: head: no deformity eyes: no periorbital swelling, no proptosis external nose and ears are normal mouth: no lesion seen NECK: supple, thyroid is not enlarged CHEST WALL: no deformity LUNGS: clear to auscultation CV: reg rate and rhythm, no murmur ABD: abdomen is soft, nontender.  no  hepatosplenomegaly.  not distended.  no hernia MUSCULOSKELETAL: muscle bulk and strength are grossly normal.  no obvious joint swelling.  gait is normal and steady EXTEMITIES: no deformity.  no edema PULSES: no carotid bruit NEURO:  cn 2-12 grossly intact.   readily moves all 4's.  sensation is intact to touch on all 4's SKIN:  Normal texture and temperature.  No rash or suspicious lesion is visible.   NODES:  None palpable at the neck PSYCH: alert, well-oriented.  Does not appear anxious nor depressed.  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 Please ensure no clinical indication of pituitary hyperfunction.  I have reviewed outside records, and summarized: Pt had MRI to eval seizures, and was noted to have pituitary cyst, and referred here.     Assessment & Plan:  Pituitary cyst, new to me.  She should have pituitary functional evaluation  Patient Instructions  You should do a "dexamethasone suppression test."  For this, you would take dexamethasone 1 mg at 10 pm (I have sent a prescription to your pharmacy), then come in for a "cortisol" blood test the next morning before 9 am.  You do not need to be fasting for this test. If as expected, these tests are normal, Please come back for a follow-up appointment in 1 year, when you will be due for another MRI. This cyst is seldom anything to worry about

## 2017-10-09 NOTE — Patient Instructions (Signed)
1.  Stop your tramadol (ultram) 2.  Start keppra - 500 mg twice per day 3.  Eat a healthy diet and exercise 4.  I will see you in 4-5 months

## 2017-10-09 NOTE — Patient Instructions (Signed)
You should do a "dexamethasone suppression test."  For this, you would take dexamethasone 1 mg at 10 pm (I have sent a prescription to your pharmacy), then come in for a "cortisol" blood test the next morning before 9 am.  You do not need to be fasting for this test. If as expected, these tests are normal, Please come back for a follow-up appointment in 1 year, when you will be due for another MRI. This cyst is seldom anything to worry about

## 2017-10-10 ENCOUNTER — Other Ambulatory Visit (INDEPENDENT_AMBULATORY_CARE_PROVIDER_SITE_OTHER): Payer: Medicare Other

## 2017-10-10 DIAGNOSIS — E236 Other disorders of pituitary gland: Secondary | ICD-10-CM | POA: Diagnosis not present

## 2017-10-10 LAB — URINALYSIS, ROUTINE W REFLEX MICROSCOPIC
Bilirubin Urine: NEGATIVE
Ketones, ur: NEGATIVE
Leukocytes, UA: NEGATIVE
Nitrite: NEGATIVE
Specific Gravity, Urine: <=1.005 — AB (ref 1.000–1.030)
Total Protein, Urine: NEGATIVE
Urine Glucose: NEGATIVE
Urobilinogen, UA: 0.2 (ref 0.0–1.0)
pH: 6.5 (ref 5.0–8.0)

## 2017-10-10 LAB — BASIC METABOLIC PANEL
BUN: 10 mg/dL (ref 6–23)
CO2: 29 mEq/L (ref 19–32)
Calcium: 9.7 mg/dL (ref 8.4–10.5)
Chloride: 104 mEq/L (ref 96–112)
Creatinine, Ser: 0.68 mg/dL (ref 0.40–1.20)
GFR: 115.61 mL/min (ref >60.00)
Glucose, Bld: 110 mg/dL — ABNORMAL HIGH (ref 70–99)
Potassium: 4.5 mEq/L (ref 3.5–5.1)
Sodium: 141 mEq/L (ref 135–145)

## 2017-10-10 LAB — FOLLICLE STIMULATING HORMONE: FSH: 73.1 m[IU]/mL

## 2017-10-10 LAB — CORTISOL: Cortisol, Plasma: 0.9 ug/dL

## 2017-10-10 LAB — LUTEINIZING HORMONE: LH: 34.93 m[IU]/mL

## 2017-10-10 LAB — T4, FREE: Free T4: 0.75 ng/dL (ref 0.60–1.60)

## 2017-10-10 LAB — TSH: TSH: 0.56 u[IU]/mL (ref 0.35–4.50)

## 2017-10-11 LAB — PROLACTIN: Prolactin: 3.3 ng/mL

## 2017-10-15 ENCOUNTER — Other Ambulatory Visit: Payer: Self-pay | Admitting: Emergency Medicine

## 2017-10-19 LAB — INSULIN-LIKE GROWTH FACTOR
IGF-I, LC/MS: 143 ng/mL (ref 50–317)
Z-Score (Female): 0.1 SD (ref ?–2.0)

## 2017-10-19 LAB — ARGININE VASOPRESSIN HORMONE: Arginine Vasopressin: 1.9 pg/mL

## 2017-10-22 ENCOUNTER — Ambulatory Visit (HOSPITAL_COMMUNITY): Payer: Self-pay | Admitting: Psychiatry

## 2017-10-23 ENCOUNTER — Ambulatory Visit: Payer: Self-pay | Admitting: Endocrinology

## 2017-10-28 ENCOUNTER — Ambulatory Visit: Payer: Self-pay

## 2017-10-29 ENCOUNTER — Ambulatory Visit (INDEPENDENT_AMBULATORY_CARE_PROVIDER_SITE_OTHER): Payer: Medicare Other | Admitting: *Deleted

## 2017-10-29 DIAGNOSIS — L501 Idiopathic urticaria: Secondary | ICD-10-CM | POA: Diagnosis not present

## 2017-10-29 DIAGNOSIS — L508 Other urticaria: Secondary | ICD-10-CM

## 2017-11-06 ENCOUNTER — Ambulatory Visit (INDEPENDENT_AMBULATORY_CARE_PROVIDER_SITE_OTHER): Payer: Medicare Other | Admitting: Podiatry

## 2017-11-06 ENCOUNTER — Other Ambulatory Visit: Payer: Self-pay | Admitting: Podiatry

## 2017-11-06 ENCOUNTER — Encounter: Payer: Self-pay | Admitting: Podiatry

## 2017-11-06 ENCOUNTER — Ambulatory Visit (INDEPENDENT_AMBULATORY_CARE_PROVIDER_SITE_OTHER): Payer: Medicare Other

## 2017-11-06 VITALS — BP 107/72 | HR 63 | Resp 16

## 2017-11-06 DIAGNOSIS — M779 Enthesopathy, unspecified: Secondary | ICD-10-CM

## 2017-11-06 DIAGNOSIS — M21619 Bunion of unspecified foot: Secondary | ICD-10-CM | POA: Diagnosis not present

## 2017-11-06 DIAGNOSIS — M2010 Hallux valgus (acquired), unspecified foot: Secondary | ICD-10-CM | POA: Diagnosis not present

## 2017-11-06 DIAGNOSIS — M79671 Pain in right foot: Secondary | ICD-10-CM

## 2017-11-06 DIAGNOSIS — M79672 Pain in left foot: Secondary | ICD-10-CM

## 2017-11-06 MED ORDER — TRIAMCINOLONE ACETONIDE 10 MG/ML IJ SUSP
10.0000 mg | Freq: Once | INTRAMUSCULAR | Status: AC
  Administered 2017-11-06: 10 mg

## 2017-11-06 NOTE — Progress Notes (Signed)
   Subjective:    Patient ID: [name], female    DOB: Dec 02, 1961, 56 y.o.   MRN: [id number]  HPI    Review of Systems  All other systems reviewed and are negative.      Objective:   Physical Exam        Assessment & Plan:

## 2017-11-06 NOTE — Patient Instructions (Signed)
Gout Gout is painful swelling that can happen in some of your joints. Gout is a type of arthritis. This condition is caused by having too much uric acid in your body. Uric acid is a chemical that is made when your body breaks down substances called purines. If your body has too much uric acid, sharp crystals can form and build up in your joints. This causes pain and swelling. Gout attacks can happen quickly and be very painful (acute gout). Over time, the attacks can affect more joints and happen more often (chronic gout). Follow these instructions at home: During a Gout Attack  If directed, put ice on the painful area: ? Put ice in a plastic bag. ? Place a towel between your skin and the bag. ? Leave the ice on for 20 minutes, 2-3 times a day.  Rest the joint as much as possible. If the joint is in your leg, you may be given crutches to use.  Raise (elevate) the painful joint above the level of your heart as often as you can.  Drink enough fluids to keep your pee (urine) clear or pale yellow.  Take over-the-counter and prescription medicines only as told by your doctor.  Do not drive or use heavy machinery while taking prescription pain medicine.  Follow instructions from your doctor about what you can or cannot eat and drink.  Return to your normal activities as told by your doctor. Ask your doctor what activities are safe for you. Avoiding Future Gout Attacks  Follow a low-purine diet as told by a specialist (dietitian) or your doctor. Avoid foods and drinks that have a lot of purines, such as: ? Liver. ? Kidney. ? Anchovies. ? Asparagus. ? Herring. ? Mushrooms ? Mussels. ? Beer.  Limit alcohol intake to no more than 1 drink a day for nonpregnant women and 2 drinks a day for men. One drink equals 12 oz of beer, 5 oz of wine, or 1 oz of hard liquor.  Stay at a healthy weight or lose weight if you are overweight. If you want to lose weight, talk with your doctor. It is  important that you do not lose weight too fast.  Start or continue an exercise plan as told by your doctor.  Drink enough fluids to keep your pee clear or pale yellow.  Take over-the-counter and prescription medicines only as told by your doctor.  Keep all follow-up visits as told by your doctor. This is important. Contact a doctor if:  You have another gout attack.  You still have symptoms of a gout attack after10 days of treatment.  You have problems (side effects) because of your medicines.  You have chills or a fever.  You have burning pain when you pee (urinate).  You have pain in your lower back or belly. Get help right away if:  You have very bad pain.  Your pain cannot be controlled.  You cannot pee. This information is not intended to replace advice given to you by your health care provider. Make sure you discuss any questions you have with your health care provider. Document Released: 09/11/2008 Document Revised: 05/10/2016 Document Reviewed: 09/15/2015 Elsevier Interactive Patient Education  2018 Elsevier Inc.  

## 2017-11-06 NOTE — Progress Notes (Signed)
Subjective:    Patient ID: [name], female   DOB: 56 y.o.   MRN: [id number]   HPI patient states the right first metatarsals been bothering me and it started in the last few weeks and also some underneath. I also have had hammertoe surgery in the past and they're improved but still present and I do not currently smoke and I like to be active    Review of Systems  All other systems reviewed and are negative.       Objective:  Physical Exam  Constitutional: She appears well-developed and well-nourished.  Cardiovascular: Intact distal pulses.  Pulmonary/Chest: Effort normal.  Musculoskeletal: Normal range of motion.  Neurological: She is alert.  Skin: Skin is warm.  Nursing note and vitals reviewed.  neurovascular status intact muscle strength adequate range of motion within normal limits with patient found to have inflammation and some prominence around the first metatarsal head right with fluid buildup and is noted to have moderate discomfort underneath the first metatarsal and the sesamoidal complex right patient has good digital perfusion and is well oriented     Assessment:   Structural bunion deformity with inflammation around the first MPJ and mild plantar discomfort and also moderate digital      Plan:   Inflammatory capsulitis first MPJ with possibility of gout or other structural condition and sesamoiditis right. Today I went ahead and I carefully injected around the first MPJ 3 mg Kenalog 5 mill grams Xylocaine I applied dancer's pad to take  pressure off plantarly and advised on wider shoes  X-rays indicate mild structural bunion deformity with digital deformities with well controlled with no other significant pathology

## 2017-11-25 ENCOUNTER — Ambulatory Visit: Payer: Self-pay

## 2017-11-26 ENCOUNTER — Ambulatory Visit: Payer: Medicare Other

## 2017-12-03 ENCOUNTER — Ambulatory Visit (INDEPENDENT_AMBULATORY_CARE_PROVIDER_SITE_OTHER): Payer: Medicare Other

## 2017-12-03 DIAGNOSIS — J4540 Moderate persistent asthma, uncomplicated: Secondary | ICD-10-CM | POA: Diagnosis not present

## 2017-12-03 DIAGNOSIS — J45901 Unspecified asthma with (acute) exacerbation: Secondary | ICD-10-CM

## 2017-12-14 ENCOUNTER — Other Ambulatory Visit: Payer: Self-pay | Admitting: Internal Medicine

## 2017-12-14 ENCOUNTER — Other Ambulatory Visit: Payer: Self-pay | Admitting: Allergy and Immunology

## 2017-12-14 DIAGNOSIS — J45901 Unspecified asthma with (acute) exacerbation: Secondary | ICD-10-CM

## 2017-12-14 DIAGNOSIS — J441 Chronic obstructive pulmonary disease with (acute) exacerbation: Secondary | ICD-10-CM

## 2017-12-16 NOTE — Telephone Encounter (Signed)
Courtesy refill  

## 2017-12-17 HISTORY — PX: SPINE SURGERY: SHX786

## 2017-12-20 ENCOUNTER — Telehealth: Payer: Self-pay | Admitting: Allergy and Immunology

## 2017-12-20 MED ORDER — PREDNISONE 10 MG PO TABS: ORAL_TABLET | ORAL | 0 refills | Status: DC

## 2017-12-20 NOTE — Telephone Encounter (Signed)
Pt called and needs to have presdisone called into her pharmacy because her chest is tight and coughing. [facility] [phone].

## 2017-12-20 NOTE — Telephone Encounter (Signed)
Per Dr. Nelva Bush she stated to send in prednisone 30 mg twice a day for 5 days. Dr. Nelva Bush has spoken to the patient and gave her instruction.

## 2017-12-20 NOTE — Telephone Encounter (Signed)
Called and spoke to pt.  She is having to use her albuterol "like water" many times throughout the day.  She feels like she has a cold and has a lot of chest tightness, cough and shortness of breath.  She states she was unable to come to the office for evaluation today due to transportation issues.  She reports she is taking all her routine medications including Incruse and symbicort as directed.  I have prescribed a 5 day course of steroid for her to take and advised that she let us know how she is doing on Monday.  She is aware of s/sx to go to [facility] or ED.

## 2017-12-28 ENCOUNTER — Other Ambulatory Visit: Payer: Self-pay | Admitting: Allergy and Immunology

## 2017-12-28 ENCOUNTER — Other Ambulatory Visit: Payer: Self-pay | Admitting: Family Medicine

## 2017-12-28 DIAGNOSIS — J3089 Other allergic rhinitis: Secondary | ICD-10-CM

## 2017-12-28 DIAGNOSIS — L501 Idiopathic urticaria: Secondary | ICD-10-CM

## 2017-12-30 ENCOUNTER — Ambulatory Visit
Admission: RE | Admit: 2017-12-30 | Discharge: 2017-12-30 | Disposition: A | Discharge status: Home / Self Care | Payer: Medicare Other | Source: Ambulatory Visit | Attending: Internal Medicine | Admitting: Internal Medicine

## 2017-12-30 DIAGNOSIS — Z1231 Encounter for screening mammogram for malignant neoplasm of breast: Secondary | ICD-10-CM

## 2017-12-30 NOTE — Telephone Encounter (Signed)
Refill denied. Pt has not been seen in over a year.  

## 2017-12-30 NOTE — Telephone Encounter (Signed)
Courtesy Refill

## 2018-01-01 ENCOUNTER — Ambulatory Visit (INDEPENDENT_AMBULATORY_CARE_PROVIDER_SITE_OTHER): Payer: Medicare Other

## 2018-01-01 DIAGNOSIS — L501 Idiopathic urticaria: Secondary | ICD-10-CM

## 2018-01-01 DIAGNOSIS — L508 Other urticaria: Secondary | ICD-10-CM

## 2018-01-09 ENCOUNTER — Ambulatory Visit (INDEPENDENT_AMBULATORY_CARE_PROVIDER_SITE_OTHER): Payer: Medicare Other | Admitting: Allergy & Immunology

## 2018-01-09 ENCOUNTER — Encounter: Payer: Self-pay | Admitting: Allergy & Immunology

## 2018-01-09 VITALS — BP 108/68 | HR 76 | Resp 17 | Wt 174.8 lb

## 2018-01-09 DIAGNOSIS — J0100 Acute maxillary sinusitis, unspecified: Secondary | ICD-10-CM | POA: Diagnosis not present

## 2018-01-09 DIAGNOSIS — L508 Other urticaria: Secondary | ICD-10-CM

## 2018-01-09 DIAGNOSIS — J449 Chronic obstructive pulmonary disease, unspecified: Secondary | ICD-10-CM

## 2018-01-09 MED ORDER — LEVOFLOXACIN 500 MG PO TABS
500.0000 mg | ORAL_TABLET | Freq: Every day | ORAL | 0 refills | Status: AC
Start: 2018-01-09 — End: 2018-01-19

## 2018-01-09 MED ORDER — FLUTICASONE-UMECLIDIN-VILANT 100-62.5-25 MCG/INH IN AEPB: 1.0000 | INHALATION_SPRAY | Freq: Every day | RESPIRATORY_TRACT | 5 refills | Status: DC

## 2018-01-09 NOTE — Progress Notes (Signed)
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 Asthma-COPD overlap syndrome - Lung testing looked stable today, and it did not respond to albuterol treatment. - I would like to simplify your regimen a bit to Trelegy one puff once daily (this contains two long-acting medications to open your lungs up combined with long-acting inhaled steroid). - Daily controller medication(s): Trelegy one puff once daily + Xolair daily - Prior to physical activity: ProAir 2 puffs 10-15 minutes before physical activity. - Rescue medications: ProAir 4 puffs every 4-6 hours as needed or albuterol nebulizer one vial puffs every 4-6 hours as needed - Asthma control goals:  * Full participation in all desired activities (may need albuterol before activity) * Albuterol use two time or less a week on average (not counting use with activity) * Cough interfering with sleep two time or less a month * Oral steroids no more than once a year * No hospitalizations  2. Chronic urticaria - Continue with Xolair monthly (this will also help with your asthma). - Continue with an antihistamine as needed.  3. Acute non-recurrent maxillary sinusitis - With your current symptoms and time course, antibiotics are needed: Levaquin 500mg  daily for seven days - Add on nasal saline spray (i.e., Simply Saline) or nasal saline lavage (i.e., NeilMed) as needed prior to medicated nasal sprays. - For thick post nasal drainage, add guaifenesin [phone] mg (Mucinex)  twice daily as needed with adequate hydration as discussed.  4. Return in about 4 weeks (around 02/06/2018).   Subjective:   [name] is a 57 y.o. female presenting today for follow up of  Chief Complaint  Patient presents with  . Other    Sinus pressure/pain x 1 week.       [name] has a history of the following: Patient Active Problem List   Diagnosis Date Noted  . 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 Syncope 09/04/2017  . Constipation 09/04/2017  . Nonintractable headache 09/04/2017  . Leg swelling 07/26/2017  . 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 Hyperlipidemia 04/20/2017  . Pedal edema 04/19/2017  . Bipolar I disorder ([place]) 09/19/2016  . Facial pain 07/12/2016  . Rash 06/14/2016  . Asthma/COPD exacerbation 02/21/2016  . Migraine 01/25/2016  . Idiopathic urticaria/pruritus 01/23/2016  . Perennial allergic rhinitis 01/23/2016  . Oral candidiasis 01/23/2016  . Greater trochanteric bursitis of both hips 09/08/2015  . Chest pain 06/22/2015  . Itching 06/22/2015  . Bilateral knee pain 06/22/2015  . Bilateral hip pain 06/22/2015  . Anemia, iron deficiency 12/22/2014  . Fatigue 12/21/2014  . Intertrigo 08/07/2014  . Dyspareunia 07/12/2014  . Menopausal syndrome (hot flushes) 07/12/2014  . History of tobacco abuse 07/12/2014  . Dizziness 06/22/2014  . Weakness 06/22/2014  . Fibromyalgia 05/14/2014  . Routine general medical examination at a health care facility 04/22/2014  . Recurrent boils 04/22/2014  . Recurrent falls 04/22/2014  . Peripheral vascular disease ([place])   . Depression   . Anxiety   . GERD (gastroesophageal reflux disease)   . Prediabetes 02/23/2014  . COPD exacerbation ([place]) 02/23/2014  . Screening mammogram for high-risk patient 02/23/2014  . Back pain 07/22/2013  . COPD (chronic obstructive pulmonary disease) ([place]) 08/24/2009  . Headache(784.0) 08/24/2009  . HTN (hypertension) 05/12/2009  . Allergic rhinitis 05/12/2009  . Asthma 05/12/2009  History obtained from: chart review and patient.  [facility] Primary Care Provider is Biagio Borg, MD.     [name] is a 57 y.o. female presenting for a follow up visit. [name] has a history of asthma with COPD overlap syndrome as  well as chronic rhinitis and chronic urticaria. She was last seen in September 2018 by Gareth Morgan FNP. At that time, she was having intense coughing, therefore a CXR was ordered which was unrevealing. She was started on Augmentin BID for ten days and steroids were administered. She was continued on Xolair for her urticarial. In the interim, she called in earlier this month complaining of asthma symptoms. She was using her albuterol routinely and prednisone 30mg  BID was sent in. Review of her last 12 months shows that she has received steroids in July 2018, September 2018, and January 2019.   Since the last visit, she reports that she has done fairly well. She did have symptoms of sinus congestion for the past week. She also endorses a worsening cough. She has been using her asthma medications more than usually. She endorses problems with gasping for breath in the middle of the night.    Overall since September she has mostly done well. She is currently on Incruse on puff once daily, Symbicort 160/4.5 two puffs BID, and Spiriva Handihaler. Overall she is very confused about her regimen and would like to try to simplify things. It is not entirely clear how compliant she is with her medications, but she does have all of her inhalers present today.   Otherwise, there have been no changes to her past medical history, surgical history, family history, or social history.    Review of Systems: a 14-point review of systems is pertinent for what is mentioned in HPI.  Otherwise, all other systems were negative. Constitutional: negative other than that listed in the HPI Eyes: negative other than that listed in the HPI Ears, nose, mouth, throat, and face: negative other than that listed in the HPI Respiratory: negative other than that listed in the HPI Cardiovascular: negative other than that listed in the HPI Gastrointestinal: negative other than that listed in the HPI Genitourinary: negative other than that  listed in the HPI Integument: negative other than that listed in the HPI Hematologic: negative other than that listed in the HPI Musculoskeletal: negative other than that listed in the HPI Neurological: negative other than that listed in the HPI Allergy/Immunologic: negative other than that listed in the HPI    Objective:   Blood pressure 108/68, pulse 76, resp. rate 17, weight 174 lb 12.8 oz (79.3 kg), SpO2 97 %. Body mass index is 30.96 kg/m.   Physical Exam:  General: Alert, interactive, in no acute distress. Pleasant female. Appreciative. Eyes: No conjunctival injection bilaterally, no discharge on the right, no discharge on the left and no Horner-Trantas dots present. PERRL bilaterally. EOMI without pain. No photophobia.  Ears: Right TM pearly gray with normal light reflex, Left TM pearly gray with normal light reflex, Right TM intact without perforation and Left TM intact without perforation.  Nose/Throat: External nose within normal limits and septum midline. Turbinates edematous and pale with crusty discharge. Posterior oropharynx erythematous without cobblestoning in the posterior oropharynx. Tonsils 2+ without exudates.  Tongue without thrush. Adenopathy: no enlarged lymph nodes appreciated in the anterior cervical, occipital, axillary, epitrochlear, inguinal, or popliteal regions. Lungs: Clear to auscultation without wheezing, rhonchi or rales. No increased work of breathing. CV: Normal S1/S2. No  murmurs. Capillary refill <2 seconds.  Skin: Warm and dry, without lesions or rashes. Neuro:   Grossly intact. No focal deficits appreciated. Responsive to questions.  Diagnostic studies:   Spirometry: results abnormal (FEV1: 0.76/36%, FVC: 1.63/61%, FEV1/FVC: 47%).    Spirometry consistent with mixed obstructive and restrictive disease. Xopenex/Atrovent nebulizer treatment given in clinic with no improvement.  Allergy Studies: none      Salvatore Marvel, MD [facility]

## 2018-01-09 NOTE — Patient Instructions (Addendum)
1. Asthma-COPD overlap syndrome - Lung testing looked stable today, and it did not respond to albuterol treatment. - I would like to simplify your regimen a bit to Trelegy one puff once daily (this contains two long-acting medications to open your lungs up combined with long-acting inhaled steroid). - Daily controller medication(s): Trelegy one puff once daily + Xolair daily - Prior to physical activity: ProAir 2 puffs 10-15 minutes before physical activity. - Rescue medications: ProAir 4 puffs every 4-6 hours as needed or albuterol nebulizer one vial puffs every 4-6 hours as needed - Asthma control goals:  * Full participation in all desired activities (may need albuterol before activity) * Albuterol use two time or less a week on average (not counting use with activity) * Cough interfering with sleep two time or less a month * Oral steroids no more than once a year * No hospitalizations  2. Chronic urticaria - Continue with Xolair monthly (this will also help with your asthma). - Continue with an antihistamine as needed.  3. Acute non-recurrent maxillary sinusitis - With your current symptoms and time course, antibiotics are needed: Levaquin 500mg  daily for seven days - Add on nasal saline spray (i.e., Simply Saline) or nasal saline lavage (i.e., NeilMed) as needed prior to medicated nasal sprays. - For thick post nasal drainage, add guaifenesin [phone] mg (Mucinex)  twice daily as needed with adequate hydration as discussed.  4. Return in about 4 weeks (around 02/06/2018).  Please inform us of any Emergency Department visits, hospitalizations, or changes in symptoms. Call us before going to the ED for breathing or allergy symptoms since we might be able to fit you in for a sick visit. Feel free to contact us anytime with any questions, problems, or concerns.  It was a pleasure to see you again today! Happy New Year!   Websites that have reliable patient information: 1. American  Academy of Asthma, Allergy, and Immunology: www.aaaai.org 2. Food Allergy Research and Education (FARE): foodallergy.org 3. Mothers of Asthmatics: http://www.asthmacommunitynetwork.org 4. American College of Allergy, Asthma, and Immunology: www.acaai.org

## 2018-01-10 ENCOUNTER — Encounter: Payer: Self-pay | Admitting: Allergy & Immunology

## 2018-01-14 ENCOUNTER — Encounter: Payer: Self-pay | Admitting: Pulmonary Disease

## 2018-01-14 ENCOUNTER — Ambulatory Visit (INDEPENDENT_AMBULATORY_CARE_PROVIDER_SITE_OTHER)
Admission: RE | Admit: 2018-01-14 | Discharge: 2018-01-14 | Disposition: A | Discharge status: Home / Self Care | Payer: Medicare Other | Source: Ambulatory Visit | Attending: Pulmonary Disease | Admitting: Pulmonary Disease

## 2018-01-14 ENCOUNTER — Ambulatory Visit (INDEPENDENT_AMBULATORY_CARE_PROVIDER_SITE_OTHER): Payer: Medicare Other | Admitting: Pulmonary Disease

## 2018-01-14 VITALS — BP 122/74 | HR 83 | Ht 63.0 in | Wt 171.0 lb

## 2018-01-14 DIAGNOSIS — R0602 Shortness of breath: Secondary | ICD-10-CM | POA: Diagnosis not present

## 2018-01-14 DIAGNOSIS — R05 Cough: Secondary | ICD-10-CM | POA: Diagnosis not present

## 2018-01-14 DIAGNOSIS — J449 Chronic obstructive pulmonary disease, unspecified: Secondary | ICD-10-CM | POA: Diagnosis not present

## 2018-01-14 LAB — NITRIC OXIDE: Nitric Oxide: 19

## 2018-01-14 MED ORDER — PREDNISONE 10 MG PO TABS: ORAL_TABLET | ORAL | 0 refills | Status: DC

## 2018-01-14 NOTE — Progress Notes (Signed)
[name]    [id number]    November 01, 1961  Primary Care Physician:John, [name], MD  Referring Physician: Biagio Borg, MD [place], [place] [address]  Chief complaint: Acute visit for dyspnea  HPI: 57 year old with COPD, asthma, chronic urticaria, chronic rhinitis.  She gets regular Xolair injections through her allergist She was seen last week at [facility] for acute sinusitis and prescribed levofloxacin.  She still has 2 more days of antibiotic left.  She reports worsening chest congestion, cough, dyspnea.  This is associated with clear mucus.  Denies any fevers, chills Inhalers changed from Symbicort to trelegy.  She is using her albuterol nebulizer 3-4 times per day.  Outpatient Encounter Medications as of 01/14/2018  Medication Sig  . albuterol (PROVENTIL) (2.5 MG/3ML) 0.083% nebulizer solution Take 2.5 mg by nebulization every 4 (four) hours as needed for wheezing or shortness of breath. Reported on 06/04/2016  . albuterol (VENTOLIN HFA) 108 (90 Base) MCG/ACT inhaler One to two puffs every 4 to 6 hours as needed  . aspirin 325 MG tablet Take 325 mg by mouth daily.  Marland Kitchen azelastine (ASTELIN) 0.1 % nasal spray USE 2 SPRAYS IN EACH NOSTRIL TWICE DAILY AS DIRECTED  . benzonatate (TESSALON PERLES) 100 MG capsule 1-2 tab by mouth every 6 hrs as needed for cough  . cetirizine (ZYRTEC) 10 MG tablet Take 1 tablet (10 mg total) by mouth daily.  . cholecalciferol (VITAMIN D) 1000 units tablet Take 1,000 Units by mouth daily.  Marland Kitchen dexamethasone (DECADRON) 1 MG tablet Take at 9-10 PM, the night before blood test  . divalproex (DEPAKOTE ER) 500 MG 24 hr tablet Take 1 tablet (500 mg total) by mouth daily.  Marland Kitchen Fe Cbn-Fe Gluc-FA-B12-C-DSS (FERRALET 90) 90-1 MG TABS Take 1 tablet by mouth daily.  . fluticasone (FLONASE) 50 MCG/ACT nasal spray Place 2 sprays into both nostrils daily.  . Fluticasone-Umeclidin-Vilant (TRELEGY ELLIPTA) 100-62.5-25 MCG/INH AEPB Inhale 1  puff into the lungs daily.  Marland Kitchen gabapentin (NEURONTIN) 100 MG capsule TAKE 2 CAPSULES BY MOUTH AT BEDTIME  . GENERLAC 10 GM/15ML SOLN TAKE 45 TO 90 ML( 30-60 GM) BY MOUTH TWICE DAILY AS NEEDED  . hydrochlorothiazide (MICROZIDE) 12.5 MG capsule Take 1 capsule (12.5 mg total) by mouth daily.  . irbesartan-hydrochlorothiazide (AVALIDE) 150-12.5 MG tablet TAKE 1 TABLET BY MOUTH EVERY DAY  . levETIRAcetam (KEPPRA) 500 MG tablet Take 1 tablet (500 mg total) by mouth 2 (two) times daily.  Marland Kitchen levocetirizine (XYZAL) 5 MG tablet TAKE 1 TABLET(5 MG) BY MOUTH DAILY AS NEEDED FOR ALLERGIES  . levofloxacin (LEVAQUIN) 500 MG tablet Take 1 tablet (500 mg total) by mouth daily for 10 days.  . meloxicam (MOBIC) 15 MG tablet Take 1 tablet (15 mg total) by mouth daily.  . montelukast (SINGULAIR) 10 MG tablet TAKE 1 TABLET(10 MG) BY MOUTH AT BEDTIME  . NONFORMULARY OR COMPOUNDED ITEM Estradiol 0.02 % 4ml prefilled applicator Sig: apply twice a week  . pantoprazole (PROTONIX) 40 MG tablet TAKE 1 TABLET BY MOUTH DAILY  . pravastatin (PRAVACHOL) 40 MG tablet Take 1 tablet (40 mg total) by mouth daily.  . QUEtiapine (SEROQUEL) 100 MG tablet Take 1 tablet (100 mg total) by mouth at bedtime.  . ranitidine (ZANTAC) 150 MG tablet Take 1 tablet (150 mg total) by mouth 2 (two) times daily as needed for heartburn.  Marland Kitchen tiZANidine (ZANAFLEX) 4 MG tablet TAKE 1 TABLET(4 MG) BY MOUTH EVERY 6  HOURS AS NEEDED FOR MUSCLE SPASMS  . traMADol (ULTRAM) 50 MG tablet Take 1 tablet (50 mg total) by mouth every 6 (six) hours as needed. For pain.  Marland Kitchen triamcinolone cream (KENALOG) 0.1 % APPLY EXTERNALLY TO THE AFFECTED AREA TWICE DAILY  . VENTOLIN HFA 108 (90 Base) MCG/ACT inhaler INHALE 1 TO 2 PUFFS BY MOUTH EVERY 4 TO 6 HOURS AS NEEDED  . [DISCONTINUED] INCRUSE ELLIPTA 62.5 MCG/INH AEPB INHALE 1 PUFF INTO THE LUNGS DAILY  . [DISCONTINUED] predniSONE (DELTASONE) 10 MG tablet Take 30 mg twice a day for 5 days then stop.  . [DISCONTINUED]  SYMBICORT 160-4.5 MCG/ACT inhaler INHALE 2 PUFFS INTO THE LUNGS TWICE DAILY  . [DISCONTINUED] tiotropium (SPIRIVA HANDIHALER) 18 MCG inhalation capsule Place 1 capsule (18 mcg total) into inhaler and inhale daily.   Facility-Administered Encounter Medications as of 01/14/2018  Medication  . ipratropium-albuterol (DUONEB) 0.5-2.5 (3) MG/3ML nebulizer solution 3 mL  . omalizumab Arvid Right) injection 300 mg    Allergies as of 01/14/2018 - Review Complete 01/14/2018  Allergen Reaction Noted  . Sulfa antibiotics Nausea And Vomiting 07/13/2011  . Sulfonamide derivatives Nausea And Vomiting 06/15/2009    Past Medical History:  Diagnosis Date  . ALLERGIC RHINITIS 05/12/2009   Qualifier: Diagnosis of  By: Lenn Cal Deborra Medina), Wynona Canes    . Anemia, iron deficiency 12/22/2014  . Angina   . Anxiety   . Arthritis   . Asthma   . ASTHMA 05/12/2009   Severe AFL (Spirometry 05/2009: pre-BD FEV1 0.87L 34% pred, post-BD FEV1 1.11L 44% pred) Volumes hyperinflated Decreased DLCO that does not fully correct to normal range for alveolar volume.     Marland Kitchen COPD 08/24/2009   Qualifier: Diagnosis of  By: Burnett Kanaris    . COPD (chronic obstructive pulmonary disease) ([place])   . Depression   . Fibromyalgia 05/14/2014  . GERD (gastroesophageal reflux disease)   . Hyperlipidemia 04/20/2017  . HYPERTENSION 05/12/2009   Qualifier: Diagnosis of  By: Lenn Cal Deborra Medina), Wynona Canes    . Migraine   . Nephrolithiasis   . Peripheral vascular disease ([place])   . Prediabetes 02/23/2014  . Urticaria     Past Surgical History:  Procedure Laterality Date  . ABDOMINAL HYSTERECTOMY    . BACK SURGERY    . COLONOSCOPY  12/20/2011   Procedure: COLONOSCOPY;  Surgeon: Landry Dyke, MD;  Location: [facility];  Service: Endoscopy;  Laterality: N/A;  . COLONOSCOPY  03/05/2012   Procedure: COLONOSCOPY;  Surgeon: Landry Dyke, MD;  Location: [facility];  Service: Endoscopy;  Laterality: N/A;  . DIAGNOSTIC LAPAROSCOPY    . HEMORRHOID  SURGERY    . INCISE AND DRAIN ABCESS    . KIDNEY STONE SURGERY    . NECK SURGERY    . TOE SURGERY    . TUBAL LIGATION      Family History  Problem Relation Age of Onset  . Diabetes Mother   . COPD Mother   . Heart disease Mother   . Asthma Mother   . Diabetes Father   . Kidney disease Father   . Cancer Father   . Anesthesia problems Father   . Alcohol abuse Father   . Diabetes Sister   . Hypertension Sister   . Diabetes Brother   . Sleep apnea Brother   . Asthma Brother   . Alcohol abuse Brother   . Heart disease Sister   . Diabetes Sister   . Brain cancer Sister   . Heart disease Brother   .  Asthma Sister   . Allergic rhinitis Neg Hx   . Eczema Neg Hx   . Urticaria Neg Hx     Social History   Socioeconomic History  . Marital status: Divorced    Spouse name: Not on file  . Number of children: Not on file  . Years of education: Not on file  . Highest education level: Not on file  Social Needs  . Financial resource strain: Not on file  . Food insecurity - worry: Not on file  . Food insecurity - inability: Not on file  . Transportation needs - medical: Not on file  . Transportation needs - non-medical: Not on file  Occupational History  . Occupation: unemployed  Tobacco Use  . Smoking status: Former Smoker    Packs/day: 0.50    Years: 36.00    Pack years: 18.00    Types: Cigarettes    Last attempt to quit: 04/05/2017    Years since quitting: 0.7  . Smokeless tobacco: Never Used  . Tobacco comment: quit 02/2016 but started back --Pt states she is "an off and on smoker."    Substance and Sexual Activity  . Alcohol use: No    Alcohol/week: 0.0 oz    Comment: drinks red wine occasionally  . Drug use: No    Comment: denies on 09/09/17  . Sexual activity: Yes    Birth control/protection: Surgical  Other Topics Concern  . Not on file  Social History Narrative   Lives alone   Lives apt, 3 stories up; goes up and down stairs every day;    Generally climbs  flights of stairs 2 times a day    Review of systems: Review of Systems  Constitutional: Negative for fever and chills.  HENT: Negative.   Eyes: Negative for blurred vision.  Respiratory: as per HPI  Cardiovascular: Negative for chest pain and palpitations.  Gastrointestinal: Negative for vomiting, diarrhea, blood per rectum. Genitourinary: Negative for dysuria, urgency, frequency and hematuria.  Musculoskeletal: Negative for myalgias, back pain and joint pain.  Skin: Negative for itching and rash.  Neurological: Negative for dizziness, tremors, focal weakness, seizures and loss of consciousness.  Endo/Heme/Allergies: Negative for environmental allergies.  Psychiatric/Behavioral: Negative for depression, suicidal ideas and hallucinations.  All other systems reviewed and are negative.  Physical Exam: Blood pressure 122/74, pulse 83, height 5\' 3"  (1.6 m), weight 171 lb (77.6 kg), SpO2 95 %. Gen:      No acute distress HEENT:  EOMI, sclera anicteric Neck:     No masses; no thyromegaly Lungs:    Clear to auscultation bilaterally; normal respiratory effort CV:         Regular rate and rhythm; no murmurs Abd:      + bowel sounds; soft, non-tender; no palpable masses, no distension Ext:    No edema; adequate peripheral perfusion Skin:      Warm and dry; no rash Neuro: alert and oriented x 3 Psych: normal mood and affect  Data Reviewed: CT chest 10/22/11-no pulmonary embolism, mild emphysematous changes Chest x-ray 08/25/17- clear lungs.  No acute cardiopulmonary disease I have reviewed the images personally  Spirometry 01/09/18 FVC 4.54%], FEV1 0.76 [36%], F/F 53. Moderate obstructive airway with no response to bronchodilator.  Assessment/Plan: COPD, asthma with acute exacerbation She is finishing up levofloxacin and will not need additional antibiotic I will get chest x-ray today and give a short prednisone taper starting at 40 mg.  Reduce dose by 10 mg every 3 days Continue on  trelegy inhaler and albuterol as needed Follow-up with Dr. Lamonte Sakai.  Marshell Garfinkel MD [facility] Pager [phone] 01/14/2018, 3:21 PM  CC: Biagio Borg, MD

## 2018-01-14 NOTE — Patient Instructions (Signed)
Finish your antibiotic We will get a chest x-ray to make sure there is no lung abnormality We will give you a prednisone taper starting at 40 mg.  Reduce dose by 10 mg every 3 days Follow-up with Dr. Lamonte Sakai.

## 2018-01-15 ENCOUNTER — Encounter: Payer: Self-pay | Admitting: Obstetrics & Gynecology

## 2018-01-15 ENCOUNTER — Ambulatory Visit (INDEPENDENT_AMBULATORY_CARE_PROVIDER_SITE_OTHER): Payer: Medicare Other | Admitting: Obstetrics & Gynecology

## 2018-01-15 VITALS — BP 136/88 | Ht 62.5 in | Wt 170.0 lb

## 2018-01-15 DIAGNOSIS — N951 Menopausal and female climacteric states: Secondary | ICD-10-CM | POA: Diagnosis not present

## 2018-01-15 DIAGNOSIS — N766 Ulceration of vulva: Secondary | ICD-10-CM | POA: Diagnosis not present

## 2018-01-15 DIAGNOSIS — L723 Sebaceous cyst: Secondary | ICD-10-CM

## 2018-01-15 MED ORDER — ESTRADIOL 0.05 MG/24HR TD PTWK: 0.0500 mg | MEDICATED_PATCH | TRANSDERMAL | 4 refills | Status: DC

## 2018-01-15 NOTE — Progress Notes (Signed)
    [name] October 26, 1961 [id number]        57 y.o.  [id number] Single  RP: 2 vulvar bumps and severe hot flushes and night sweats  HPI:  Abstinent x 2016.  No h/o genital HSV.  C/O a burning, tender left mid vulvar small ulcer that comes and goes.  Also has a bump at the left upper vulva which has been getting slowly bigger in the last few weeks.  Menopausal with severe night sweats interfering with sleep, drenching the bed.  Hot flushes frequently every day.  Would like to start HRT.  S/P Hysterectomy.  No pelvic pain.  No vaginal discharge.  Urine/BMs wnl.  No fever.   OB History  Gravida Para Term Preterm AB Living  7 3 3   4 3   SAB TAB Ectopic Multiple Live Births  3 1          # Outcome Date GA Lbr Len/2nd Weight Sex Delivery Anes PTL Lv  7 TAB           6 SAB           5 SAB           4 SAB           3 Term           2 Term           1 Term               Past medical history,surgical history, problem list, medications, allergies, family history and social history were all reviewed and documented in the EPIC chart.   Directed ROS with pertinent positives and negatives documented in the history of present illness/assessment and plan.  Exam:  Vitals:   01/15/18 1012  BP: 136/88  Weight: 170 lb (77.1 kg)  Height: 5' 2.5" (1.588 m)   General appearance:  Normal  Abdomen: Normal  Gynecologic exam:  Vulva:  Sebaceous cyst at anterior left vulva about 1.5 x 1.0 cm.  Drained of thick secretion with pressure.  Ulcer on mid left vulva about 5 mm in diameter.  HSV Sureswab done.   Assessment/Plan:  57 y.o. [id number]   1. Menopause syndrome Severe vasomotor menopausal symptoms.  Night sweats are significantly interfering with her sleep.  Hot flushes every day.  Would like to start on hormone replacement therapy.  No contraindication.  Status post hysterectomy.  Decision to start on estradiol patch.  Usage, risks and benefits reviewed.  Risks of blood clots, strokes and  risks of breast cancer after 10 years of use reviewed.  Benefits such as cardiovascular protection and decreased bone loss discussed.  As well as the benefits of controlling the vasomotor menopausal symptoms and helping with vaginal dryness and eventual sexual activity.  Patient voiced understanding and agreement.  Estradiol patch 0.05 sent to pharmacy.  2. Vulvar ulcer Rule out recurrent genital herpes. - SureSwab HSV, Type 1/2 DNA, PCR  3. Sebaceous cyst Sebaceous cyst drained by applying pressure.  Frequent warm baths recommended.  Other orders - estradiol (CLIMARA - DOSED IN MG/24 HR) 0.05 mg/24hr patch; Place 1 patch (0.05 mg total) onto the skin once a week.  Counseling on above issues more than 50% for 25 minutes.  Princess Bruins MD, 10:24 AM 01/15/2018

## 2018-01-15 NOTE — Patient Instructions (Signed)
1. Menopause syndrome Severe vasomotor menopausal symptoms.  Night sweats are significantly interfering with her sleep.  Hot flushes every day.  Would like to start on hormone replacement therapy.  No contraindication.  Status post hysterectomy.  Decision to start on estradiol patch.  Usage, risks and benefits reviewed.  Risks of blood clots, strokes and risks of breast cancer after 10 years of use reviewed.  Benefits such as cardiovascular protection and decreased bone loss discussed.  As well as the benefits of controlling the vasomotor menopausal symptoms and helping with vaginal dryness and eventual sexual activity.  Patient voiced understanding and agreement.  Estradiol patch 0.05 sent to pharmacy.  2. Vulvar ulcer Rule out recurrent genital herpes. - SureSwab HSV, Type 1/2 DNA, PCR  3. Sebaceous cyst Sebaceous cyst drained by applying pressure.  Frequent warm baths recommended.  Other orders - estradiol (CLIMARA - DOSED IN MG/24 HR) 0.05 mg/24hr patch; Place 1 patch (0.05 mg total) onto the skin once a week.  [name], it was a pleasure meeting you today!  I will inform you of your results as soon as they are available.   Menopause and Hormone Replacement Therapy What is hormone replacement therapy? Hormone replacement therapy (HRT) is the use of artificial (synthetic) hormones to replace hormones that your body stops producing during menopause. Menopause is the normal time of life when menstrual periods stop completely and the ovaries stop producing the female hormones estrogen and progesterone. This lack of hormones can affect your health and cause undesirable symptoms. HRT can relieve some of those symptoms. What are my options for HRT? HRT may consist of the synthetic hormones estrogen and progestin, or it may consist of only estrogen (estrogen-only therapy). You and your health care provider will decide which form of HRT is best for you. If you choose to be on HRT and you have a  uterus, estrogen and progestin are usually prescribed. Estrogen-only therapy is used for women who do not have a uterus. Possible options for taking HRT include:  Pills.  Patches.  Gels.  Sprays.  Vaginal cream.  Vaginal rings.  Vaginal inserts.  The amount of hormone(s) that you take and how long you take the hormone(s) varies depending on your individual health. It is important to:  Begin HRT with the lowest possible dosage.  Stop HRT as soon as your health care provider tells you to stop.  Work with your health care provider so that you feel informed and comfortable with your decisions.  What are the benefits of HRT? HRT can reduce the frequency and severity of menopausal symptoms. Benefits of HRT vary depending on the menopausal symptoms that you have, the severity of your symptoms, and your overall health. HRT may help to improve the following menopausal symptoms:  Hot flashes and night sweats. These are sudden feelings of heat that spread over the face and body. The skin may turn red, like a blush. Night sweats are hot flashes that happen while you are sleeping or trying to sleep.  Bone loss (osteoporosis). The body loses calcium more quickly after menopause, causing the bones to become weaker. This can increase the risk for bone breaks (fractures).  Vaginal dryness. The lining of the vagina can become thin and dry, which can cause pain during sexual intercourse or cause infection, burning, or itching.  Urinary tract infections.  Urinary incontinence. This is a decreased ability to control when you urinate.  Irritability.  Short-term memory problems.  What are the risks of HRT? Risks of  HRT vary depending on your individual health and medical history. Risks of HRT also depend on whether you receive both estrogen and progestin or you receive estrogen only.HRT may increase the risk of:  Spotting. This is when a small amount of bloodleaks from the vagina  unexpectedly.  Endometrial cancer. This cancer is in the lining of the uterus (endometrium).  Breast cancer.  Increased density of breast tissue. This can make it harder to find breast cancer on a breast X-ray (mammogram).  Stroke.  Heart attack.  Blood clots.  Gallbladder disease.  Risks of HRT can increase if you have any of the following conditions:  Endometrial cancer.  Liver disease.  Heart disease.  Breast cancer.  History of blood clots.  History of stroke.  How should I care for myself while I am on HRT?  Take over-the-counter and prescription medicines only as told by your health care provider.  Get mammograms, pelvic exams, and medical checkups as often as told by your health care provider.  Have Pap tests done as often as told by your health care provider. A Pap test is sometimes called a Pap smear. It is a screening test that is used to check for signs of cancer of the cervix and vagina. A Pap test can also identify the presence of infection or precancerous changes. Pap tests may be done: ? Every 3 years, starting at age 26. ? Every 5 years, starting after age 45, in combination with testing for human papillomavirus (HPV). ? More often or less often depending on other medical conditions you have, your age, and other risk factors.  It is your responsibility to get your Pap test results. Ask your health care provider or the department performing the test when your results will be ready.  Keep all follow-up visits as told by your health care provider. This is important. When should I seek medical care? Talk with your health care provider if:  You have any of these: ? Pain or swelling in your legs. ? Shortness of breath. ? Chest pain. ? Lumps or changes in your breasts or armpits. ? Slurred speech. ? Pain, burning, or bleeding when you urine.  You develop any of these: ? Unusual vaginal bleeding. ? Dizziness or headaches. ? Weakness or numbness in any  part of your arms or legs. ? Pain in your abdomen.  This information is not intended to replace advice given to you by your health care provider. Make sure you discuss any questions you have with your health care provider. Document Released: 09/01/2003 Document Revised: 10/30/2016 Document Reviewed: 06/06/2015 Elsevier Interactive Patient Education  2017 [employer].

## 2018-01-17 LAB — SURESWAB HSV, TYPE 1/2 DNA, PCR
HSV 1 DNA: NOT DETECTED
HSV 2 DNA: NOT DETECTED

## 2018-01-28 ENCOUNTER — Ambulatory Visit (INDEPENDENT_AMBULATORY_CARE_PROVIDER_SITE_OTHER): Payer: Medicare Other | Admitting: *Deleted

## 2018-01-28 ENCOUNTER — Encounter: Payer: Self-pay | Admitting: Obstetrics & Gynecology

## 2018-01-28 ENCOUNTER — Ambulatory Visit (INDEPENDENT_AMBULATORY_CARE_PROVIDER_SITE_OTHER): Payer: Medicare Other | Admitting: Obstetrics & Gynecology

## 2018-01-28 VITALS — BP 126/78 | Ht 62.25 in | Wt 174.0 lb

## 2018-01-28 DIAGNOSIS — Z9189 Other specified personal risk factors, not elsewhere classified: Secondary | ICD-10-CM | POA: Diagnosis not present

## 2018-01-28 DIAGNOSIS — Z01419 Encounter for gynecological examination (general) (routine) without abnormal findings: Secondary | ICD-10-CM

## 2018-01-28 DIAGNOSIS — Z90710 Acquired absence of both cervix and uterus: Secondary | ICD-10-CM

## 2018-01-28 DIAGNOSIS — Z13820 Encounter for screening for osteoporosis: Secondary | ICD-10-CM

## 2018-01-28 DIAGNOSIS — J4550 Severe persistent asthma, uncomplicated: Secondary | ICD-10-CM

## 2018-01-28 DIAGNOSIS — Z01411 Encounter for gynecological examination (general) (routine) with abnormal findings: Secondary | ICD-10-CM | POA: Diagnosis not present

## 2018-01-28 DIAGNOSIS — Z79890 Hormone replacement therapy (postmenopausal): Secondary | ICD-10-CM

## 2018-01-28 NOTE — Progress Notes (Signed)
[name] 10/25/1961 [id number]   History:    57 y.o. [id number] Single  RP:  Established patient presenting for annual gyn exam   HPI: S/P Hysterectomy.  Menopause, well on Estradiol 0.05 patch weekly x 01/15/2018.  Vasomotor menopausal symptoms controled.  No pelvic pain.  Vulva normal, resolved small ulcers on which HSV 1-2 were negative.  Not currently sexually active.  Breasts wnl.  Urine normal. Tendency for constipation.  BMI 31.57.  Past medical history,surgical history, family history and social history were all reviewed and documented in the EPIC chart.  Gynecologic History No LMP recorded. Patient has had a hysterectomy. Contraception: status post hysterectomy Last Pap: 2015. Results were: Negative Last mammogram: 12/2017. Results were: Negative Bone Density: None Colonoscopy: 2013, 10 yr schedule  Obstetric History OB History  Gravida Para Term Preterm AB Living  7 3 3   4 3   SAB TAB Ectopic Multiple Live Births  3 1          # Outcome Date GA Lbr Len/2nd Weight Sex Delivery Anes PTL Lv  7 TAB           6 SAB           5 SAB           4 SAB           3 Term           2 Term           1 Term                ROS: A ROS was performed and pertinent positives and negatives are included in the history.  GENERAL: No fevers or chills. HEENT: No change in vision, no earache, sore throat or sinus congestion. NECK: No pain or stiffness. CARDIOVASCULAR: No chest pain or pressure. No palpitations. PULMONARY: No shortness of breath, cough or wheeze. GASTROINTESTINAL: No abdominal pain, nausea, vomiting or diarrhea, melena or bright red blood per rectum. GENITOURINARY: No urinary frequency, urgency, hesitancy or dysuria. MUSCULOSKELETAL: No joint or muscle pain, no back pain, no recent trauma. DERMATOLOGIC: No rash, no itching, no lesions. ENDOCRINE: No polyuria, polydipsia, no heat or cold intolerance. No recent change in weight. HEMATOLOGICAL: No anemia or easy bruising  or bleeding. NEUROLOGIC: No headache, seizures, numbness, tingling or weakness. PSYCHIATRIC: No depression, no loss of interest in normal activity or change in sleep pattern.     Exam:   BP 126/78   Ht 5' 2.25" (1.581 m)   Wt 174 lb (78.9 kg)   BMI 31.57 kg/m   Body mass index is 31.57 kg/m.  General appearance : Well developed well nourished female. No acute distress HEENT: Eyes: no retinal hemorrhage or exudates,  Neck supple, trachea midline, no carotid bruits, no thyroidmegaly Lungs: Clear to auscultation, no rhonchi or wheezes, or rib retractions  Heart: Regular rate and rhythm, no murmurs or gallops Breast:Examined in sitting and supine position were symmetrical in appearance, no palpable masses or tenderness,  no skin retraction, no nipple inversion, no nipple discharge, no skin discoloration, no axillary or supraclavicular lymphadenopathy Abdomen: no palpable masses or tenderness, no rebound or guarding Extremities: no edema or skin discoloration or tenderness  Pelvic: Vulva: Normal             Vagina: No gross lesions or discharge.  Pap reflex done.  Cervix/Uterus absent  Adnexa  Without masses or tenderness  Anus: Normal   Assessment/Plan:  57 y.o. female  for annual exam   1. Encounter for gynecological examination with abnormal finding Gynecologic exam status post total hysterectomy.  Pap reflex done.  Breast exam normal.  Recent mammogram January 2019 was negative.  Health labs with family physician.  Next colonoscopy due 2023.  Continue with regular physical activity, recommend aerobic activity 5 times a week and weightlifting every 2 days.  Low calorie/low carb diet recommended.  2. Post-menopause on HRT (hormone replacement therapy) Well on estradiol patch 0.05 weekly.  No contraindication to hormone replacement therapy.  Status post total hysterectomy.  Prescription already sent with refills for the year.  3. S/P total hysterectomy  4. Screening for  osteoporosis Vitamin D supplements, calcium rich nutrition and regular weightbearing physical activity recommended.  Schedule bone density here.  Princess Bruins MD, 10:43 AM 01/28/2018

## 2018-01-28 NOTE — Patient Instructions (Signed)
1. Encounter for gynecological examination with abnormal finding Gynecologic exam status post total hysterectomy.  Pap reflex done.  Breast exam normal.  Recent mammogram January 2019 was negative.  Health labs with family physician.  Next colonoscopy due 2023.  Continue with regular physical activity, recommend aerobic activity 5 times a week and weightlifting every 2 days.  Low calorie/low carb diet recommended.  2. Post-menopause on HRT (hormone replacement therapy) Well on estradiol patch 0.05 weekly.  No contraindication to hormone replacement therapy.  Status post total hysterectomy.  Prescription already sent with refills for the year.  3. S/P total hysterectomy  4. Screening for osteoporosis Vitamin D supplements, calcium rich nutrition and regular weightbearing physical activity recommended.  Schedule bone density here.  [name], it was a pleasure seeing you today!  I will inform you of your results as soon as they are available.   Health Maintenance for Postmenopausal Women Menopause is a normal process in which your reproductive ability comes to an end. This process happens gradually over a span of months to years, usually between the ages of 54 and 15. Menopause is complete when you have missed 12 consecutive menstrual periods. It is important to talk with your health care provider about some of the most common conditions that affect postmenopausal women, such as heart disease, cancer, and bone loss (osteoporosis). Adopting a healthy lifestyle and getting preventive care can help to promote your health and wellness. Those actions can also lower your chances of developing some of these common conditions. What should I know about menopause? During menopause, you may experience a number of symptoms, such as:  Moderate-to-severe hot flashes.  Night sweats.  Decrease in sex drive.  Mood swings.  Headaches.  Tiredness.  Irritability.  Memory problems.  Insomnia.  Choosing  to treat or not to treat menopausal changes is an individual decision that you make with your health care provider. What should I know about hormone replacement therapy and supplements? Hormone therapy products are effective for treating symptoms that are associated with menopause, such as hot flashes and night sweats. Hormone replacement carries certain risks, especially as you become older. If you are thinking about using estrogen or estrogen with progestin treatments, discuss the benefits and risks with your health care provider. What should I know about heart disease and stroke? Heart disease, heart attack, and stroke become more likely as you age. This may be due, in part, to the hormonal changes that your body experiences during menopause. These can affect how your body processes dietary fats, triglycerides, and cholesterol. Heart attack and stroke are both medical emergencies. There are many things that you can do to help prevent heart disease and stroke:  Have your blood pressure checked at least every 1-2 years. High blood pressure causes heart disease and increases the risk of stroke.  If you are 22-19 years old, ask your health care provider if you should take aspirin to prevent a heart attack or a stroke.  Do not use any tobacco products, including cigarettes, chewing tobacco, or electronic cigarettes. If you need help quitting, ask your health care provider.  It is important to eat a healthy diet and maintain a healthy weight. ? Be sure to include plenty of vegetables, fruits, low-fat dairy products, and lean protein. ? Avoid eating foods that are high in solid fats, added sugars, or salt (sodium).  Get regular exercise. This is one of the most important things that you can do for your health. ? Try to exercise for  at least 150 minutes each week. The type of exercise that you do should increase your heart rate and make you sweat. This is known as moderate-intensity exercise. ? Try to  do strengthening exercises at least twice each week. Do these in addition to the moderate-intensity exercise.  Know your numbers.Ask your health care provider to check your cholesterol and your blood glucose. Continue to have your blood tested as directed by your health care provider.  What should I know about cancer screening? There are several types of cancer. Take the following steps to reduce your risk and to catch any cancer development as early as possible. Breast Cancer  Practice breast self-awareness. ? This means understanding how your breasts normally appear and feel. ? It also means doing regular breast self-exams. Let your health care provider know about any changes, no matter how small.  If you are 45 or older, have a clinician do a breast exam (clinical breast exam or CBE) every year. Depending on your age, family history, and medical history, it may be recommended that you also have a yearly breast X-ray (mammogram).  If you have a family history of breast cancer, talk with your health care provider about genetic screening.  If you are at high risk for breast cancer, talk with your health care provider about having an MRI and a mammogram every year.  Breast cancer (BRCA) gene test is recommended for women who have family members with BRCA-related cancers. Results of the assessment will determine the need for genetic counseling and BRCA1 and for BRCA2 testing. BRCA-related cancers include these types: ? Breast. This occurs in males or females. ? Ovarian. ? Tubal. This may also be called fallopian tube cancer. ? Cancer of the abdominal or pelvic lining (peritoneal cancer). ? Prostate. ? Pancreatic.  Cervical, Uterine, and Ovarian Cancer Your health care provider may recommend that you be screened regularly for cancer of the pelvic organs. These include your ovaries, uterus, and vagina. This screening involves a pelvic exam, which includes checking for microscopic changes to  the surface of your cervix (Pap test).  For women ages 21-65, health care providers may recommend a pelvic exam and a Pap test every three years. For women ages 59-65, they may recommend the Pap test and pelvic exam, combined with testing for human papilloma virus (HPV), every five years. Some types of HPV increase your risk of cervical cancer. Testing for HPV may also be done on women of any age who have unclear Pap test results.  Other health care providers may not recommend any screening for nonpregnant women who are considered low risk for pelvic cancer and have no symptoms. Ask your health care provider if a screening pelvic exam is right for you.  If you have had past treatment for cervical cancer or a condition that could lead to cancer, you need Pap tests and screening for cancer for at least 20 years after your treatment. If Pap tests have been discontinued for you, your risk factors (such as having a new sexual partner) need to be reassessed to determine if you should start having screenings again. Some women have medical problems that increase the chance of getting cervical cancer. In these cases, your health care provider may recommend that you have screening and Pap tests more often.  If you have a family history of uterine cancer or ovarian cancer, talk with your health care provider about genetic screening.  If you have vaginal bleeding after reaching menopause, tell your health care  provider.  There are currently no reliable tests available to screen for ovarian cancer.  Lung Cancer Lung cancer screening is recommended for adults 21-9 years old who are at high risk for lung cancer because of a history of smoking. A yearly low-dose CT scan of the lungs is recommended if you:  Currently smoke.  Have a history of at least 30 pack-years of smoking and you currently smoke or have quit within the past 15 years. A pack-year is smoking an average of one pack of cigarettes per day for one  year.  Yearly screening should:  Continue until it has been 15 years since you quit.  Stop if you develop a health problem that would prevent you from having lung cancer treatment.  Colorectal Cancer  This type of cancer can be detected and can often be prevented.  Routine colorectal cancer screening usually begins at age 80 and continues through age 15.  If you have risk factors for colon cancer, your health care provider may recommend that you be screened at an earlier age.  If you have a family history of colorectal cancer, talk with your health care provider about genetic screening.  Your health care provider may also recommend using home test kits to check for hidden blood in your stool.  A small camera at the end of a tube can be used to examine your colon directly (sigmoidoscopy or colonoscopy). This is done to check for the earliest forms of colorectal cancer.  Direct examination of the colon should be repeated every 5-10 years until age 63. However, if early forms of precancerous polyps or small growths are found or if you have a family history or genetic risk for colorectal cancer, you may need to be screened more often.  Skin Cancer  Check your skin from head to toe regularly.  Monitor any moles. Be sure to tell your health care provider: ? About any new moles or changes in moles, especially if there is a change in a mole's shape or color. ? If you have a mole that is larger than the size of a pencil eraser.  If any of your family members has a history of skin cancer, especially at a young age, talk with your health care provider about genetic screening.  Always use sunscreen. Apply sunscreen liberally and repeatedly throughout the day.  Whenever you are outside, protect yourself by wearing long sleeves, pants, a wide-brimmed hat, and sunglasses.  What should I know about osteoporosis? Osteoporosis is a condition in which bone destruction happens more quickly than  new bone creation. After menopause, you may be at an increased risk for osteoporosis. To help prevent osteoporosis or the bone fractures that can happen because of osteoporosis, the following is recommended:  If you are 29-79 years old, get at least 1,000 mg of calcium and at least 600 mg of vitamin D per day.  If you are older than age 82 but younger than age 60, get at least 1,200 mg of calcium and at least 600 mg of vitamin D per day.  If you are older than age 86, get at least 1,200 mg of calcium and at least 800 mg of vitamin D per day.  Smoking and excessive alcohol intake increase the risk of osteoporosis. Eat foods that are rich in calcium and vitamin D, and do weight-bearing exercises several times each week as directed by your health care provider. What should I know about how menopause affects my mental health? Depression may occur  at any age, but it is more common as you become older. Common symptoms of depression include:  Low or sad mood.  Changes in sleep patterns.  Changes in appetite or eating patterns.  Feeling an overall lack of motivation or enjoyment of activities that you previously enjoyed.  Frequent crying spells.  Talk with your health care provider if you think that you are experiencing depression. What should I know about immunizations? It is important that you get and maintain your immunizations. These include:  Tetanus, diphtheria, and pertussis (Tdap) booster vaccine.  Influenza every year before the flu season begins.  Pneumonia vaccine.  Shingles vaccine.  Your health care provider may also recommend other immunizations. This information is not intended to replace advice given to you by your health care provider. Make sure you discuss any questions you have with your health care provider. Document Released: 01/25/2006 Document Revised: 06/22/2016 Document Reviewed: 09/06/2015 Elsevier Interactive Patient Education  2018 [employer].

## 2018-01-29 ENCOUNTER — Other Ambulatory Visit: Payer: Self-pay | Admitting: Gynecology

## 2018-01-29 DIAGNOSIS — Z13820 Encounter for screening for osteoporosis: Secondary | ICD-10-CM

## 2018-01-29 DIAGNOSIS — Z780 Asymptomatic menopausal state: Secondary | ICD-10-CM

## 2018-02-03 LAB — PAP IG W/ RFLX HPV ASCU

## 2018-02-11 ENCOUNTER — Encounter: Payer: Self-pay | Admitting: Emergency Medicine

## 2018-02-11 ENCOUNTER — Encounter: Payer: Self-pay | Admitting: Gynecology

## 2018-02-11 ENCOUNTER — Ambulatory Visit (INDEPENDENT_AMBULATORY_CARE_PROVIDER_SITE_OTHER): Payer: Medicare Other

## 2018-02-11 ENCOUNTER — Ambulatory Visit (INDEPENDENT_AMBULATORY_CARE_PROVIDER_SITE_OTHER): Payer: Medicare Other | Admitting: Emergency Medicine

## 2018-02-11 DIAGNOSIS — K219 Gastro-esophageal reflux disease without esophagitis: Secondary | ICD-10-CM | POA: Diagnosis not present

## 2018-02-11 DIAGNOSIS — F17200 Nicotine dependence, unspecified, uncomplicated: Secondary | ICD-10-CM | POA: Diagnosis not present

## 2018-02-11 DIAGNOSIS — Z780 Asymptomatic menopausal state: Secondary | ICD-10-CM | POA: Diagnosis not present

## 2018-02-11 DIAGNOSIS — J45901 Unspecified asthma with (acute) exacerbation: Secondary | ICD-10-CM | POA: Diagnosis not present

## 2018-02-11 DIAGNOSIS — J449 Chronic obstructive pulmonary disease, unspecified: Secondary | ICD-10-CM | POA: Diagnosis not present

## 2018-02-11 DIAGNOSIS — J301 Allergic rhinitis due to pollen: Secondary | ICD-10-CM

## 2018-02-11 NOTE — Assessment & Plan Note (Signed)
Remains on Xolair

## 2018-02-11 NOTE — Progress Notes (Signed)
Subjective:    Patient ID: [name], female    DOB: [birth year], 57 y.o.   MRN: [id number]  COPD  She complains of cough, shortness of breath and wheezing. Associated symptoms include chest pain. Pertinent negatives include no fever, postnasal drip or rhinorrhea. Her past medical history is significant for COPD.    Acute OV 11/16/16 -- patient has a history of severe obstructive lung disease with continued tobacco use, bipolar disorder, allergic rhinitis, frequent exacerbations. She has been managed on Xolair by her allergist. She was last seen here 1 month ago. Currently managed on Spiriva, Symbicort. We restarted Protonix, Flonase, Astelin nasal spray last visit as frequently exacerbating. She continues to smoke approximately 2 a day. She has noticed a L posterior flank and back pain 5 days ago. Has had continued to have cough although reports that congestion is better than last time. It is worst with twisting or laying flat. Bad when she coughs. Does not hurt when she is still. Breathing is better than last time - less nasal congestion.    ROV 02/11/18 --57 year old woman, active smoker (rare cigarettes now) with bipolar disorder, allergic rhinitis, COPD/asthma diagnosed around 2000.  Spirometry 05/2009 showed severe obstruction (34% predicted FEV1).  She has been managed with Xolair by her allergist, getting once a month.  I had not seen her since 11/16/16.  She was seen here by Dr. Vaughan Browner on 1/29 with symptoms consistent with an acute exacerbation. Finished abx and pred taper with good improvement. PCP changed her to Trelegy 1 month ago - she prefers it, believes that she has benefited from this.  She is starting to have a lot of nasal congestion and mucous especially in the am, associated with cough. She is on flonase and zyrtec, singulair. Has astelin NS available. Not on xyzal. She is using albuterol about 1x a day.                  Past Medical History:  Diagnosis Date  . ALLERGIC  RHINITIS 05/12/2009   Qualifier: Diagnosis of  By: Lenn Cal Deborra Medina), Wynona Canes    . Anemia, iron deficiency 12/22/2014  . Angina   . Anxiety   . Arthritis   . Asthma   . ASTHMA 05/12/2009   Severe AFL (Spirometry 05/2009: pre-BD FEV1 0.87L 34% pred, post-BD FEV1 1.11L 44% pred) Volumes hyperinflated Decreased DLCO that does not fully correct to normal range for alveolar volume.     Marland Kitchen COPD 08/24/2009   Qualifier: Diagnosis of  By: Burnett Kanaris    . COPD (chronic obstructive pulmonary disease) ([place])   . Depression   . Fibromyalgia 05/14/2014  . GERD (gastroesophageal reflux disease)   . Hyperlipidemia 04/20/2017  . HYPERTENSION 05/12/2009   Qualifier: Diagnosis of  By: Lenn Cal Deborra Medina), Wynona Canes    . Migraine   . Nephrolithiasis   . Peripheral vascular disease ([place])   . Prediabetes 02/23/2014  . Urticaria       Review of Systems  Constitutional: Positive for fatigue. Negative for chills and fever.  HENT: Negative for postnasal drip, rhinorrhea and sinus pressure.   Respiratory: Positive for cough, shortness of breath and wheezing.   Cardiovascular: Positive for chest pain. Negative for palpitations and leg swelling.       Objective:   Physical Exam Vitals:   02/11/18 0919  BP: 132/82  Pulse: 68  SpO2: 98%  Weight: 172 lb (78 kg)  Height: 5\' 3"  (1.6 m)  Gen: Pleasant, well-nourished, in no distress,  normal  affect  ENT: No lesions,  mouth clear,  oropharynx clear, no postnasal drip  Neck: No JVD, no stridor  Lungs: No use of accessory muscles, very distant, no wheezing  Cardiovascular: RRR, heart sounds normal, no murmur or gallops, no peripheral edema  Musculoskeletal: No deformities, no cyanosis or clubbing  Neuro: alert, non focal  Skin: Warm, no lesions or rashes      Assessment & Plan:   COPD (chronic obstructive pulmonary disease) Asthma/COPD with significant obstruction.  Recent exacerbation that appears to have resolved.  She was changed to Trelegy about a  month ago and prefers it to Symbicort.  We will continue this.  We will continue Trelegy 1 inhalation daily. Congratulations on decreasing your smoking!  Please continue to work hard on stopping altogether. Keep albuterol available to use 2 puffs up to every 4 hours if needed for shortness of breath, wheezing, chest tightness. Follow with Dr Lamonte Sakai in 6 months or sooner if you have any problems  Allergic rhinitis Continue to follow with your allergist and to receive your Xolair treatments once a month. Continue fluticasone nasal spray, Zyrtec, Singulair, Astelin nasal spray as you have been using them.  Asthma Remains on Xolair  GERD (gastroesophageal reflux disease) Continue Protonix  History of tobacco abuse Discussed smoking cessation with her today  Baltazar Apo, MD, PhD 02/11/2018, 9:41 AM [facility] [phone] or if no answer [phone]   Updated Medication List Outpatient Encounter Medications as of 02/11/2018  Medication Sig  . albuterol (PROVENTIL) (2.5 MG/3ML) 0.083% nebulizer solution Take 2.5 mg by nebulization every 4 (four) hours as needed for wheezing or shortness of breath. Reported on 06/04/2016  . albuterol (VENTOLIN HFA) 108 (90 Base) MCG/ACT inhaler One to two puffs every 4 to 6 hours as needed  . aspirin 325 MG tablet Take 325 mg by mouth daily.  Marland Kitchen azelastine (ASTELIN) 0.1 % nasal spray USE 2 SPRAYS IN EACH NOSTRIL TWICE DAILY AS DIRECTED  . benzonatate (TESSALON PERLES) 100 MG capsule 1-2 tab by mouth every 6 hrs as needed for cough  . cetirizine (ZYRTEC) 10 MG tablet Take 1 tablet (10 mg total) by mouth daily.  . cholecalciferol (VITAMIN D) 1000 units tablet Take 1,000 Units by mouth daily.  Marland Kitchen dexamethasone (DECADRON) 1 MG tablet Take at 9-10 PM, the night before blood test  . divalproex (DEPAKOTE ER) 500 MG 24 hr tablet Take 1 tablet (500 mg total) by mouth daily.  Marland Kitchen estradiol (CLIMARA - DOSED IN MG/24 HR) 0.05 mg/24hr patch Place 1 patch  (0.05 mg total) onto the skin once a week.  . Fe Cbn-Fe Gluc-FA-B12-C-DSS (FERRALET 90) 90-1 MG TABS Take 1 tablet by mouth daily.  . fluticasone (FLONASE) 50 MCG/ACT nasal spray Place 2 sprays into both nostrils daily.  . Fluticasone-Umeclidin-Vilant (TRELEGY ELLIPTA) 100-62.5-25 MCG/INH AEPB Inhale 1 puff into the lungs daily.  Marland Kitchen gabapentin (NEURONTIN) 100 MG capsule TAKE 2 CAPSULES BY MOUTH AT BEDTIME  . GENERLAC 10 GM/15ML SOLN TAKE 45 TO 90 ML( 30-60 GM) BY MOUTH TWICE DAILY AS NEEDED  . hydrochlorothiazide (MICROZIDE) 12.5 MG capsule Take 1 capsule (12.5 mg total) by mouth daily.  . irbesartan-hydrochlorothiazide (AVALIDE) 150-12.5 MG tablet TAKE 1 TABLET BY MOUTH EVERY DAY  . levETIRAcetam (KEPPRA) 500 MG tablet Take 1 tablet (500 mg total) by mouth 2 (two) times daily.  Marland Kitchen levocetirizine (XYZAL) 5 MG tablet TAKE 1 TABLET(5 MG) BY MOUTH DAILY AS NEEDED FOR ALLERGIES  . meloxicam (MOBIC) 15 MG tablet  Take 1 tablet (15 mg total) by mouth daily.  . montelukast (SINGULAIR) 10 MG tablet TAKE 1 TABLET(10 MG) BY MOUTH AT BEDTIME  . NONFORMULARY OR COMPOUNDED ITEM Estradiol 0.02 % 32ml prefilled applicator Sig: apply twice a week  . pantoprazole (PROTONIX) 40 MG tablet TAKE 1 TABLET BY MOUTH DAILY  . pravastatin (PRAVACHOL) 40 MG tablet Take 1 tablet (40 mg total) by mouth daily.  . predniSONE (DELTASONE) 10 MG tablet 4 tablets for three days.  Then 3 tablets for three days.  Then 2 tablets for three days.  Then 1 tablet for three days. Then STOP  . QUEtiapine (SEROQUEL) 100 MG tablet Take 1 tablet (100 mg total) by mouth at bedtime.  . ranitidine (ZANTAC) 150 MG tablet Take 1 tablet (150 mg total) by mouth 2 (two) times daily as needed for heartburn.  Marland Kitchen tiZANidine (ZANAFLEX) 4 MG tablet TAKE 1 TABLET(4 MG) BY MOUTH EVERY 6 HOURS AS NEEDED FOR MUSCLE SPASMS  . traMADol (ULTRAM) 50 MG tablet Take 1 tablet (50 mg total) by mouth every 6 (six) hours as needed. For pain.  Marland Kitchen triamcinolone cream  (KENALOG) 0.1 % APPLY EXTERNALLY TO THE AFFECTED AREA TWICE DAILY  . VENTOLIN HFA 108 (90 Base) MCG/ACT inhaler INHALE 1 TO 2 PUFFS BY MOUTH EVERY 4 TO 6 HOURS AS NEEDED   Facility-Administered Encounter Medications as of 02/11/2018  Medication  . ipratropium-albuterol (DUONEB) 0.5-2.5 (3) MG/3ML nebulizer solution 3 mL  . omalizumab Arvid Right) injection 300 mg

## 2018-02-11 NOTE — Assessment & Plan Note (Signed)
Asthma/COPD with significant obstruction.  Recent exacerbation that appears to have resolved.  She was changed to Trelegy about a month ago and prefers it to Symbicort.  We will continue this.  We will continue Trelegy 1 inhalation daily. Congratulations on decreasing your smoking!  Please continue to work hard on stopping altogether. Keep albuterol available to use 2 puffs up to every 4 hours if needed for shortness of breath, wheezing, chest tightness. Follow with Dr Lamonte Sakai in 6 months or sooner if you have any problems

## 2018-02-11 NOTE — Assessment & Plan Note (Signed)
Continue to follow with your allergist and to receive your Xolair treatments once a month. Continue fluticasone nasal spray, Zyrtec, Singulair, Astelin nasal spray as you have been using them.

## 2018-02-11 NOTE — Patient Instructions (Addendum)
We will continue Trelegy 1 inhalation daily. Congratulations on decreasing your smoking!  Please continue to work hard on stopping altogether. Continue to follow with your allergist and to receive your Xolair treatments once a month. Continue fluticasone nasal spray, Zyrtec, Singulair, Astelin nasal spray as you have been using them. Keep albuterol available to use 2 puffs up to every 4 hours if needed for shortness of breath, wheezing, chest tightness. Continue Protonix 40 mg daily. Follow with Dr Lamonte Sakai in 6 months or sooner if you have any problems

## 2018-02-11 NOTE — Assessment & Plan Note (Signed)
Continue Protonix °

## 2018-02-11 NOTE — Assessment & Plan Note (Signed)
Discussed smoking cessation with her today

## 2018-02-18 ENCOUNTER — Emergency Department (HOSPITAL_COMMUNITY)
Admission: EM | Admit: 2018-02-18 | Discharge: 2018-02-18 | Disposition: A | Discharge status: Home / Self Care | Payer: Medicare Other | Attending: Physician Assistant | Admitting: Physician Assistant

## 2018-02-18 ENCOUNTER — Encounter: Payer: Self-pay | Admitting: Family Medicine

## 2018-02-18 ENCOUNTER — Telehealth: Payer: Self-pay | Admitting: Internal Medicine

## 2018-02-18 ENCOUNTER — Ambulatory Visit (INDEPENDENT_AMBULATORY_CARE_PROVIDER_SITE_OTHER): Payer: Medicare Other | Admitting: Family Medicine

## 2018-02-18 ENCOUNTER — Encounter (HOSPITAL_COMMUNITY): Payer: Self-pay | Admitting: Emergency Medicine

## 2018-02-18 VITALS — BP 128/72 | HR 72 | Temp 98.1°F | Ht 63.0 in | Wt 176.0 lb

## 2018-02-18 DIAGNOSIS — M542 Cervicalgia: Secondary | ICD-10-CM | POA: Diagnosis present

## 2018-02-18 DIAGNOSIS — I10 Essential (primary) hypertension: Secondary | ICD-10-CM | POA: Diagnosis not present

## 2018-02-18 DIAGNOSIS — J45909 Unspecified asthma, uncomplicated: Secondary | ICD-10-CM | POA: Insufficient documentation

## 2018-02-18 DIAGNOSIS — Z79899 Other long term (current) drug therapy: Secondary | ICD-10-CM | POA: Insufficient documentation

## 2018-02-18 DIAGNOSIS — M79602 Pain in left arm: Secondary | ICD-10-CM | POA: Insufficient documentation

## 2018-02-18 DIAGNOSIS — Z87891 Personal history of nicotine dependence: Secondary | ICD-10-CM | POA: Diagnosis not present

## 2018-02-18 DIAGNOSIS — J449 Chronic obstructive pulmonary disease, unspecified: Secondary | ICD-10-CM | POA: Diagnosis not present

## 2018-02-18 DIAGNOSIS — Z7982 Long term (current) use of aspirin: Secondary | ICD-10-CM | POA: Diagnosis not present

## 2018-02-18 DIAGNOSIS — M5412 Radiculopathy, cervical region: Secondary | ICD-10-CM | POA: Diagnosis not present

## 2018-02-18 MED ORDER — ACETAMINOPHEN 325 MG PO TABS
650.0000 mg | ORAL_TABLET | Freq: Once | ORAL | Status: AC
Start: 2018-02-18 — End: 2018-02-18
  Administered 2018-02-18: 650 mg via ORAL
  Filled 2018-02-18: qty 2

## 2018-02-18 MED ORDER — PREDNISONE 5 MG PO TABS: ORAL_TABLET | ORAL | 0 refills | Status: DC

## 2018-02-18 MED ORDER — MORPHINE SULFATE (PF) 4 MG/ML IV SOLN
5.0000 mg | Freq: Once | INTRAVENOUS | Status: AC
  Administered 2018-02-18: 5 mg via INTRAMUSCULAR
  Filled 2018-02-18: qty 2

## 2018-02-18 NOTE — ED Provider Notes (Signed)
[facility] Provider Note   CSN: [id number] Arrival date & time: 02/18/18  1734     History   Chief Complaint Chief Complaint  Patient presents with  . Neck Pain  . Arm Pain    HPI [name] is a 57 y.o. female.  HPI    57 year old female presents today with complaints of neck and arm pain.  Patient notes a 6-day history of left trapezius shoulder and arm pain.  Intermittent numbness in her left hand.  She notes pain is significantly worsened with any range of motion of the shoulder palpation of the trapezius, she denies any significant cervical or thoracic spinal tenderness.  Patient denies any fever or chills.  She denies any chest pain shortness of breath nausea or vomiting.  She reports history of cervical surgeries in the past.  She was seen by her primary care today who prescribed her prednisone.  She notes the prednisone has not improved her symptoms.  She notes she has been taking 1 of her neighbors pain medication which did not improve her symptoms either.   Past Medical History:  Diagnosis Date  . ALLERGIC RHINITIS 05/12/2009   Qualifier: Diagnosis of  By: Lenn Cal Deborra Medina), Wynona Canes    . Anemia, iron deficiency 12/22/2014  . Angina   . Anxiety   . Arthritis   . Asthma   . ASTHMA 05/12/2009   Severe AFL (Spirometry 05/2009: pre-BD FEV1 0.87L 34% pred, post-BD FEV1 1.11L 44% pred) Volumes hyperinflated Decreased DLCO that does not fully correct to normal range for alveolar volume.     Marland Kitchen COPD 08/24/2009   Qualifier: Diagnosis of  By: Burnett Kanaris    . COPD (chronic obstructive pulmonary disease) ([place])   . Depression   . Fibromyalgia 05/14/2014  . GERD (gastroesophageal reflux disease)   . Hyperlipidemia 04/20/2017  . HYPERTENSION 05/12/2009   Qualifier: Diagnosis of  By: Lenn Cal Deborra Medina), Wynona Canes    . Migraine   . Nephrolithiasis   . Peripheral vascular disease ([place])   . Prediabetes 02/23/2014  . Urticaria     Patient  Active Problem List   Diagnosis Date Noted  . Cervical radiculopathy 02/18/2018  . 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 Syncope 09/04/2017  . Constipation 09/04/2017  . Nonintractable headache 09/04/2017  . Leg swelling 07/26/2017  . 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 Hyperlipidemia 04/20/2017  . Pedal edema 04/19/2017  . Bipolar I disorder ([place]) 09/19/2016  . Facial pain 07/12/2016  . Rash 06/14/2016  . Migraine 01/25/2016  . Idiopathic urticaria/pruritus 01/23/2016  . Perennial allergic rhinitis 01/23/2016  . Oral candidiasis 01/23/2016  . Greater trochanteric bursitis of both hips 09/08/2015  . Chest pain 06/22/2015  . Itching 06/22/2015  . Bilateral knee pain 06/22/2015  . Bilateral hip pain 06/22/2015  . Anemia, iron deficiency 12/22/2014  . Fatigue 12/21/2014  . Intertrigo 08/07/2014  . Dyspareunia 07/12/2014  . Menopausal syndrome (hot flushes) 07/12/2014  . History of tobacco abuse 07/12/2014  . Dizziness 06/22/2014  . Weakness 06/22/2014  . Fibromyalgia 05/14/2014  . Routine general medical examination at a health care facility 04/22/2014  . Recurrent boils 04/22/2014  . Recurrent falls 04/22/2014  . Peripheral vascular disease ([place])   . Depression   . Anxiety   . GERD (gastroesophageal reflux disease)   . Prediabetes 02/23/2014  . Screening mammogram for high-risk patient 02/23/2014  . Back pain 07/22/2013  . COPD (chronic obstructive pulmonary disease) ([place]) 08/24/2009  . Headache(784.0) 08/24/2009  .  HTN (hypertension) 05/12/2009  . Allergic rhinitis 05/12/2009  . Asthma 05/12/2009    Past Surgical History:  Procedure Laterality Date  . ABDOMINAL HYSTERECTOMY    . BACK SURGERY    . COLONOSCOPY  12/20/2011   Procedure: COLONOSCOPY;  Surgeon: Landry Dyke, MD;  Location: [facility];  Service: Endoscopy;  Laterality: N/A;  . COLONOSCOPY  03/05/2012   Procedure: COLONOSCOPY;  Surgeon: Landry Dyke, MD;  Location: [facility];  Service:  Endoscopy;  Laterality: N/A;  . DIAGNOSTIC LAPAROSCOPY    . HEMORRHOID SURGERY    . INCISE AND DRAIN ABCESS    . KIDNEY STONE SURGERY    . NECK SURGERY    . TOE SURGERY    . TUBAL LIGATION      OB History    Gravida Para Term Preterm AB Living   7 3 3   4 3    SAB TAB Ectopic Multiple Live Births   3 1             Home Medications    Prior to Admission medications   Medication Sig Start Date End Date Taking? Authorizing Provider  albuterol (PROVENTIL) (2.5 MG/3ML) 0.083% nebulizer solution Take 2.5 mg by nebulization every 4 (four) hours as needed for wheezing or shortness of breath. Reported on 06/04/2016    [provider]  albuterol (VENTOLIN HFA) 108 (90 Base) MCG/ACT inhaler One to two puffs every 4 to 6 hours as needed 10/16/16   Collene Gobble, MD  aspirin 325 MG tablet Take 325 mg by mouth daily.    [provider]  azelastine (ASTELIN) 0.1 % nasal spray USE 2 SPRAYS IN EACH NOSTRIL TWICE DAILY AS DIRECTED 10/16/16   Collene Gobble, MD  cetirizine (ZYRTEC) 10 MG tablet Take 1 tablet (10 mg total) by mouth daily. 08/26/17   Collene Gobble, MD  cholecalciferol (VITAMIN D) 1000 units tablet Take 1,000 Units by mouth daily.    [provider]  dexamethasone (DECADRON) 1 MG tablet Take at 9-10 PM, the night before blood test 10/09/17   Renato Shin, MD  divalproex (DEPAKOTE ER) 500 MG 24 hr tablet Take 1 tablet (500 mg total) by mouth daily. 10/24/16   Arfeen, Arlyce Harman, MD  estradiol (CLIMARA - DOSED IN MG/24 HR) 0.05 mg/24hr patch Place 1 patch (0.05 mg total) onto the skin once a week. 01/15/18   Princess Bruins, MD  Fe Cbn-Fe Gluc-FA-B12-C-DSS (FERRALET 90) 90-1 MG TABS Take 1 tablet by mouth daily. 10/18/16   Lyndal Pulley, DO  fluticasone (FLONASE) 50 MCG/ACT nasal spray Place 2 sprays into both nostrils daily. 10/16/16   Collene Gobble, MD  Fluticasone-Umeclidin-Vilant (TRELEGY ELLIPTA) 100-62.5-25 MCG/INH AEPB Inhale 1 puff into the lungs  daily. 01/09/18   Valentina Shaggy, MD  [facility] 10 GM/15ML SOLN TAKE 45 TO 90 ML( 30-60 GM) BY MOUTH TWICE DAILY AS NEEDED 09/04/17   Biagio Borg, MD  hydrochlorothiazide (MICROZIDE) 12.5 MG capsule Take 1 capsule (12.5 mg total) by mouth daily. 04/19/17 04/19/18  Biagio Borg, MD  irbesartan-hydrochlorothiazide (AVALIDE) 150-12.5 MG tablet TAKE 1 TABLET BY MOUTH EVERY DAY 12/18/17   Biagio Borg, MD  levETIRAcetam (KEPPRA) 500 MG tablet Take 1 tablet (500 mg total) by mouth 2 (two) times daily. 10/09/17   Tat, Eustace Quail, DO  levocetirizine (XYZAL) 5 MG tablet TAKE 1 TABLET(5 MG) BY MOUTH DAILY AS NEEDED FOR ALLERGIES 12/30/17   Bobbitt, Sedalia Muta, MD  meloxicam [facility]) 15  MG tablet Take 1 tablet (15 mg total) by mouth daily. 06/22/15   Biagio Borg, MD  montelukast (SINGULAIR) 10 MG tablet TAKE 1 TABLET(10 MG) BY MOUTH AT BEDTIME 09/23/17   Lyndal Pulley, DO  NONFORMULARY OR COMPOUNDED ITEM Estradiol 0.02 % 73ml prefilled applicator Sig: apply twice a week 07/12/14   Terrance Mass, MD  pantoprazole (PROTONIX) 40 MG tablet TAKE 1 TABLET BY MOUTH DAILY 07/23/17   Collene Gobble, MD  pravastatin (PRAVACHOL) 40 MG tablet Take 1 tablet (40 mg total) by mouth daily. 04/20/17   Biagio Borg, MD  predniSONE (DELTASONE) 5 MG tablet Take 6 pills for first day, 5 pills second day, 4 pills third day, 3 pills fourth day, 2 pills the fifth day, and 1 pill sixth day. 02/18/18   Rosemarie Ax, MD  QUEtiapine (SEROQUEL) 100 MG tablet Take 1 tablet (100 mg total) by mouth at bedtime. 10/24/16   Arfeen, Arlyce Harman, MD  ranitidine (ZANTAC) 150 MG tablet Take 1 tablet (150 mg total) by mouth 2 (two) times daily as needed for heartburn. 08/26/17   Collene Gobble, MD  tiZANidine (ZANAFLEX) 4 MG tablet TAKE 1 TABLET(4 MG) BY MOUTH EVERY 6 HOURS AS NEEDED FOR MUSCLE SPASMS 07/23/17   Biagio Borg, MD  triamcinolone cream (KENALOG) 0.1 % APPLY EXTERNALLY TO THE AFFECTED AREA TWICE DAILY 12/18/17   Biagio Borg, MD    VENTOLIN HFA 108 (90 Base) MCG/ACT inhaler INHALE 1 TO 2 PUFFS BY MOUTH EVERY 4 TO 6 HOURS AS NEEDED 10/15/17   Collene Gobble, MD    Family History Family History  Problem Relation Age of Onset  . Diabetes Mother   . COPD Mother   . Heart disease Mother   . Asthma Mother   . Diabetes Father   . Kidney disease Father   . Cancer Father   . Anesthesia problems Father   . Alcohol abuse Father   . Diabetes Sister   . Hypertension Sister   . Diabetes Brother   . Sleep apnea Brother   . Asthma Brother   . Alcohol abuse Brother   . Heart disease Sister   . Diabetes Sister   . Brain cancer Sister   . Heart disease Brother   . Asthma Sister   . Allergic rhinitis Neg Hx   . Eczema Neg Hx   . Urticaria Neg Hx     Social History Social History   Tobacco Use  . Smoking status: Former Smoker    Packs/day: 0.50    Years: 36.00    Pack years: 18.00    Types: Cigarettes    Last attempt to quit: 04/05/2017    Years since quitting: 0.8  . Smokeless tobacco: Never Used  . Tobacco comment: quit 02/2016 but started back --Pt states she is "an off and on smoker."    Substance Use Topics  . Alcohol use: Yes    Alcohol/week: 0.0 oz    Comment: drinks red wine occasionally  . Drug use: Yes    Types: Marijuana    Comment: TWICE A MONTH      Allergies   Sulfa antibiotics and Sulfonamide derivatives   Review of Systems Review of Systems  All other systems reviewed and are negative.    Physical Exam Updated Vital Signs BP (!) 152/98 (BP Location: Right Arm)   Pulse 81   Temp 98.8 F (37.1 C) (Oral)   Resp 16   SpO2 98%  Physical Exam  Constitutional: She is oriented to person, place, and time. She appears well-developed and well-nourished.  HENT:  Head: Normocephalic and atraumatic.  Eyes: Conjunctivae are normal. Pupils are equal, round, and reactive to light. Right eye exhibits no discharge. Left eye exhibits no discharge. No scleral icterus.  Neck: Normal range of  motion. No JVD present. No tracheal deviation present.  Pulmonary/Chest: Effort normal. No stridor.  Musculoskeletal:  No CT or L-spine tenderness to palpation, tenderness palpation of left trapezius and lateral shoulder no swelling or edema, no warmth to touch, decreased range of motion due to pain at the shoulder, grip strength 5 out of 5, radial pulse 2+, sensation intact-no swelling or edema noted throughout the upper extremity.  No pain with axial compression of the cervical spine, minor pain with left sided Spurling  Neurological: She is alert and oriented to person, place, and time. Coordination normal.  Psychiatric: She has a normal mood and affect. Her behavior is normal. Judgment and thought content normal.  Nursing note and vitals reviewed.    ED Treatments / Results  Labs (all labs ordered are listed, but only abnormal results are displayed) Labs Reviewed - No data to display  EKG  EKG Interpretation None       Radiology No results found.  Procedures Procedures (including critical care time)  Medications Ordered in ED Medications  morphine 4 MG/ML injection 5 mg (5 mg Intramuscular Given 02/18/18 2018)  acetaminophen (TYLENOL) tablet 650 mg (650 mg Oral Given 02/18/18 2017)     Initial Impression / Assessment and Plan / ED Course  I have reviewed the triage vital signs and the nursing notes.  Pertinent labs & imaging results that were available during my care of the patient were reviewed by me and considered in my medical decision making (see chart for details).      Final Clinical Impressions(s) / ED Diagnoses   Final diagnoses:  Cervical radiculopathy   Labs:   Imaging:  Consults:  Therapeutics: Morphine, Tylenol  Discharge Meds:   Assessment/Plan:   57 year old female presents today with likely radicular pain.  She has no signs of trauma, no cervical tenderness.  She has no neurological deficits.  Patient is currently taking prednisone that was  prescribed her primary care provider.  I agree with his assessment this is likely radicular pain and patient should continue on with steroids.  Patient was given morphine and Tylenol here which slightly improved her symptoms but did not resolve them.  I do not feel further imaging or testing would be necessary at this time.  She is encouraged follow-up with her primary care, ReachOut to neurosurgery if symptoms persist.  Return if they worsen.  She verbalized understanding and agreement to today's plan had no further questions or concerns.  ED Discharge Orders    None       Francee Gentile 02/18/18 2123    Macarthur Critchley, MD 02/19/18 1843

## 2018-02-18 NOTE — Patient Instructions (Signed)
Please try the medication  Please take ibuprofen once the prednisone has finished if you are still having pain.  Please follow up with me in 2-3 weeks if you are still having symptoms.

## 2018-02-18 NOTE — Discharge Instructions (Signed)
Please read attached information. If you experience any new or worsening signs or symptoms please return to the emergency room for evaluation. Please follow-up with your primary care provider or specialist as discussed.  °

## 2018-02-18 NOTE — Assessment & Plan Note (Signed)
Has a history of prior neck surgery for bulging disc. Symptoms seem to be coming from the neck. Her left shoulder exam was normal throughout. - Prednisone today - If no improvement consider updating imaging and consider gabapentin or physical therapy.
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
## 2018-02-18 NOTE — Telephone Encounter (Signed)
Copied from [place]. Topic: Quick Communication - See Telephone Encounter >> Feb 18, 2018 12:42 PM Hewitt Shorts wrote: CRM for notification. See Telephone encounter for:  PT saw Clearance Coots  for arm pain and states the pain is bad and she has taken the prednizone and still no relief and would like the injection  Best number [phone]  02/18/18. >> Feb 18, 2018  4:29 PM Cleaster Corin, NT wrote: Pt. Calling back to see if she can get injection waiting on someone to give her a call back

## 2018-02-18 NOTE — Telephone Encounter (Signed)
Copied from [place]. Topic: Quick Communication - See Telephone Encounter >> Feb 18, 2018 12:42 PM Hewitt Shorts wrote: CRM for notification. See Telephone encounter for:  PT saw Clearance Coots  for arm pain and states the pain is bad and she has taken the prednizone and still no relief and would like the injection  Best number [phone]  02/18/18.

## 2018-02-18 NOTE — ED Triage Notes (Signed)
Pt states that she is having pain in the left side of her neck and to her left arm that started Friday. She states that she has a hx of bulging disks and surgery to her C-spine, she reports that she felt like she may have slept on it wrong but the pain has just gotten worse.
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
## 2018-02-21 NOTE — Telephone Encounter (Signed)
Left message for patient to return call.

## 2018-02-24 ENCOUNTER — Encounter: Payer: Self-pay | Admitting: Neurology

## 2018-02-24 ENCOUNTER — Ambulatory Visit (INDEPENDENT_AMBULATORY_CARE_PROVIDER_SITE_OTHER): Payer: Medicare Other | Admitting: Neurology

## 2018-02-24 VITALS — BP 132/92 | HR 64 | Ht 63.0 in | Wt 179.0 lb

## 2018-02-24 DIAGNOSIS — G40909 Epilepsy, unspecified, not intractable, without status epilepticus: Secondary | ICD-10-CM | POA: Diagnosis not present

## 2018-02-24 DIAGNOSIS — Z9889 Other specified postprocedural states: Secondary | ICD-10-CM

## 2018-02-24 DIAGNOSIS — M5412 Radiculopathy, cervical region: Secondary | ICD-10-CM

## 2018-02-24 DIAGNOSIS — R202 Paresthesia of skin: Secondary | ICD-10-CM | POA: Diagnosis not present

## 2018-02-24 NOTE — Telephone Encounter (Signed)
Pt returning call - she says that no one has called her back.  She is asking for a shot for her pain and would like to know if that can be done.  She does not want to come in unless she can get a shot.  And, if pt can not get a shot, she wished to be taken off the list.   Please call pt back

## 2018-02-24 NOTE — Patient Instructions (Signed)
1. We have sent a referral to [facility] for your MRI and they will call you directly to schedule your appt. They are located at [place]. If you need to contact them directly please call [phone].  2. .Will schedule EMG in our office.

## 2018-02-25 NOTE — Telephone Encounter (Signed)
Please call and inform that she could follow up to try a steroid injection of her shoulder if she would like. If this is coming from her neck then the injection will not improve her pain.

## 2018-02-25 NOTE — Telephone Encounter (Signed)
Spoke with patient today. Info given and she has decided to wait until her MRI appointment on Tuesday to see what it shows.

## 2018-02-25 NOTE — Telephone Encounter (Signed)
(  Routing to Dr. Raeford Razor to advise)

## 2018-02-26 ENCOUNTER — Ambulatory Visit (INDEPENDENT_AMBULATORY_CARE_PROVIDER_SITE_OTHER): Payer: Medicare Other | Admitting: *Deleted

## 2018-02-26 DIAGNOSIS — J4550 Severe persistent asthma, uncomplicated: Secondary | ICD-10-CM

## 2018-03-06 ENCOUNTER — Telehealth: Payer: Self-pay | Admitting: Neurology

## 2018-03-06 ENCOUNTER — Ambulatory Visit (INDEPENDENT_AMBULATORY_CARE_PROVIDER_SITE_OTHER): Payer: Medicare Other | Admitting: Neurology

## 2018-03-06 DIAGNOSIS — R202 Paresthesia of skin: Secondary | ICD-10-CM | POA: Diagnosis not present

## 2018-03-06 DIAGNOSIS — M5412 Radiculopathy, cervical region: Secondary | ICD-10-CM

## 2018-03-06 NOTE — Telephone Encounter (Signed)
Patient made aware of results. She has not seen her neurosurgeon since 2006, so I will send a new referral to [facility]. She is having MR tomorrow and will await results to send with referral.

## 2018-03-06 NOTE — Procedures (Signed)
[facility]  [place], [address] [place]  [place], [place] [address] Tel: [phone] Fax:  [phone] Test Date:  03/06/2018  Patient: [name] DOB: 1961-02-18 Physician: Narda Amber, DO  Sex: Female Height: 5\' 3"  Ref Phys: Alonza Bogus, DO  ID#: [id number] Temp: 34.0C Technician:    Patient Complaints: This is a 57 year old female referred for evaluation of left sided neck pain radiating into the arm.  NCV & EMG Findings: Extensive electrodiagnostic testing of the left upper extremity shows:  1. Left median, mixed palmer, and ulnar sensory responses are within normal limits. 2. Left median and ulnar motor responses are within normal limits. 3. Chronic motor axon loss changes are seen affecting the pronator teres, biceps, and triceps muscles, without accompanied active denervation.  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   ___________________________ Narda Amber, DO    Nerve Conduction Studies Anti Sensory Summary Table   Site NR Peak (ms) Norm Peak (ms) P-T Amp (V) Norm P-T Amp  Left Median Anti Sensory (2nd Digit)  Wrist    2.9 <3.6 62.0 >15  Left Ulnar Anti Sensory (5th Digit)  Wrist    2.4 <3.1 39.5 >10   Motor Summary Table   Site NR Onset (ms) Norm Onset (ms) O-P Amp (mV) Norm O-P Amp Site1 Site2 Delta-0 (ms) Dist (cm) Vel (m/s) Norm Vel (m/s)  Left Median Motor (Abd Poll Brev)  Wrist    2.6 <4.0 12.4 >6 Elbow Wrist 4.5 30.0 67 >50  Elbow    7.1  11.6         Left Ulnar Motor (Abd Dig Minimi)  Wrist    1.9 <3.1 9.4 >7 B Elbow Wrist 3.4 22.0 65 >50  B Elbow    5.3  9.3  A Elbow B Elbow 1.7 10.0 59 >50  A Elbow    7.0  9.2          Comparison Summary Table   Site NR Peak (ms) Norm Peak (ms) P-T Amp (V) Site1 Site2 Delta-P (ms) Norm Delta (ms)  Left Median/Ulnar Palm Comparison (Wrist - 8cm)  Median Palm    1.4 <2.2 39.8 Median Palm Ulnar Palm 0.2   Ulnar Palm    1.6 <2.2  20.4       EMG   Side Muscle Ins Act Fibs Psw Fasc Number Recrt Dur Dur. Amp Amp. Poly Poly. Comment  Left 1stDorInt Nml Nml Nml Nml Nml Nml Nml Nml Nml Nml Nml Nml N/A  Left PronatorTeres Nml Nml Nml Nml 1- Rapid Some 1+ Some 1+ Nml Nml N/A  Left Biceps Nml Nml Nml Nml 1- Rapid Few 1+ Few 1+ Nml Nml N/A  Left Triceps Nml Nml Nml Nml 1- Rapid Some 1+ Some 1+ Nml Nml N/A  Left Deltoid Nml Nml Nml Nml Nml Nml Nml Nml Nml Nml Nml Nml N/A  Left Cervical Parasp Low Nml Nml Nml Nml Nml Nml Nml Nml Nml Nml Nml Nml N/A      Waveforms:

## 2018-03-06 NOTE — Telephone Encounter (Signed)
-----   Message from [place], DO sent at 03/06/2018 11:35 AM EDT ----- Please give patient results.  She already has a neurosx and she can f/u with him.  Send EMG to him/her

## 2018-03-07 ENCOUNTER — Ambulatory Visit
Admission: RE | Admit: 2018-03-07 | Discharge: 2018-03-07 | Disposition: A | Discharge status: Home / Self Care | Payer: Medicare Other | Source: Ambulatory Visit | Attending: Neurology | Admitting: Neurology

## 2018-03-07 ENCOUNTER — Telehealth: Payer: Self-pay | Admitting: Neurology

## 2018-03-07 DIAGNOSIS — Z9889 Other specified postprocedural states: Secondary | ICD-10-CM

## 2018-03-07 DIAGNOSIS — M50223 Other cervical disc displacement at C6-C7 level: Secondary | ICD-10-CM | POA: Diagnosis not present

## 2018-03-07 DIAGNOSIS — R202 Paresthesia of skin: Secondary | ICD-10-CM

## 2018-03-07 DIAGNOSIS — M5412 Radiculopathy, cervical region: Secondary | ICD-10-CM

## 2018-03-07 MED ORDER — GADOBENATE DIMEGLUMINE 529 MG/ML IV SOLN
16.0000 mL | Freq: Once | INTRAVENOUS | Status: AC | PRN
  Administered 2018-03-07: 16 mL via INTRAVENOUS

## 2018-03-07 NOTE — Telephone Encounter (Signed)
Patient made aware. Referral faxed to [facility] at [phone] with confirmation received. They will contact the patient to schedule.

## 2018-03-07 NOTE — Telephone Encounter (Signed)
-----   Message from Napoleon, DO sent at 03/07/2018  2:23 PM EDT ----- Luvenia Starch, let pt know that there is no cord change and some mild disc bulge.  There is a new retrolisthesis since last scanned at C4-5.  I think that she is f/u with neurosx.

## 2018-03-09 ENCOUNTER — Other Ambulatory Visit: Payer: Self-pay | Admitting: Emergency Medicine

## 2018-03-11 DIAGNOSIS — M5020 Other cervical disc displacement, unspecified cervical region: Secondary | ICD-10-CM | POA: Insufficient documentation

## 2018-03-11 DIAGNOSIS — I10 Essential (primary) hypertension: Secondary | ICD-10-CM | POA: Diagnosis not present

## 2018-03-11 DIAGNOSIS — M5412 Radiculopathy, cervical region: Secondary | ICD-10-CM | POA: Diagnosis not present

## 2018-03-11 DIAGNOSIS — Z6832 Body mass index (BMI) 32.0-32.9, adult: Secondary | ICD-10-CM | POA: Diagnosis not present

## 2018-03-11 NOTE — Telephone Encounter (Signed)
Received note from [facility] that patient is scheduled on 03/28/18 at 9 am and is aware of appt.

## 2018-03-21 ENCOUNTER — Other Ambulatory Visit: Payer: Self-pay | Admitting: Internal Medicine

## 2018-03-25 ENCOUNTER — Ambulatory Visit (INDEPENDENT_AMBULATORY_CARE_PROVIDER_SITE_OTHER): Payer: Medicare Other | Admitting: *Deleted

## 2018-03-25 DIAGNOSIS — J4550 Severe persistent asthma, uncomplicated: Secondary | ICD-10-CM | POA: Diagnosis not present

## 2018-03-26 ENCOUNTER — Ambulatory Visit: Payer: Medicare Other

## 2018-04-07 ENCOUNTER — Other Ambulatory Visit: Payer: Self-pay | Admitting: Internal Medicine

## 2018-04-09 ENCOUNTER — Telehealth: Payer: Self-pay | Admitting: Neurology

## 2018-04-09 NOTE — Telephone Encounter (Signed)
Pt called and wanted Dr Tat to write a letter stating due to health issues she can no longer go to [employer] and to stop her membership and also to her apartment complex because the neighbors are complaining of her thumping around at night and she is not sure if she is having seizures at night or not

## 2018-04-10 NOTE — Telephone Encounter (Signed)
Patient returned your call.  Thanks!

## 2018-04-10 NOTE — Telephone Encounter (Signed)
Patient made aware.

## 2018-04-10 NOTE — Telephone Encounter (Signed)
Not really sure what to do with this one. Dr. Carles Collet- Please advise.

## 2018-04-10 NOTE — Telephone Encounter (Signed)
Left message on machine for patient to call back.  To make her aware Dr. Carles Collet unable to write these letters.

## 2018-04-10 NOTE — Telephone Encounter (Signed)
I can not do this.

## 2018-04-14 ENCOUNTER — Emergency Department (HOSPITAL_COMMUNITY): Payer: Medicare Other

## 2018-04-14 ENCOUNTER — Inpatient Hospital Stay (HOSPITAL_COMMUNITY)
Admission: EM | Admit: 2018-04-14 | Discharge: 2018-04-17 | DRG: 191 | Disposition: A | Discharge status: Home Health | Payer: Medicare Other | Attending: Internal Medicine | Admitting: Internal Medicine

## 2018-04-14 ENCOUNTER — Other Ambulatory Visit: Payer: Self-pay

## 2018-04-14 ENCOUNTER — Encounter (HOSPITAL_COMMUNITY): Payer: Self-pay | Admitting: Emergency Medicine

## 2018-04-14 DIAGNOSIS — M797 Fibromyalgia: Secondary | ICD-10-CM | POA: Diagnosis present

## 2018-04-14 DIAGNOSIS — I739 Peripheral vascular disease, unspecified: Secondary | ICD-10-CM | POA: Diagnosis present

## 2018-04-14 DIAGNOSIS — Z981 Arthrodesis status: Secondary | ICD-10-CM

## 2018-04-14 DIAGNOSIS — G40909 Epilepsy, unspecified, not intractable, without status epilepticus: Secondary | ICD-10-CM

## 2018-04-14 DIAGNOSIS — M50123 Cervical disc disorder at C6-C7 level with radiculopathy: Secondary | ICD-10-CM | POA: Diagnosis not present

## 2018-04-14 DIAGNOSIS — G8918 Other acute postprocedural pain: Secondary | ICD-10-CM | POA: Diagnosis not present

## 2018-04-14 DIAGNOSIS — Z87891 Personal history of nicotine dependence: Secondary | ICD-10-CM

## 2018-04-14 DIAGNOSIS — K219 Gastro-esophageal reflux disease without esophagitis: Secondary | ICD-10-CM | POA: Diagnosis present

## 2018-04-14 DIAGNOSIS — D72829 Elevated white blood cell count, unspecified: Secondary | ICD-10-CM | POA: Diagnosis present

## 2018-04-14 DIAGNOSIS — Z8249 Family history of ischemic heart disease and other diseases of the circulatory system: Secondary | ICD-10-CM

## 2018-04-14 DIAGNOSIS — Z87442 Personal history of urinary calculi: Secondary | ICD-10-CM

## 2018-04-14 DIAGNOSIS — M5412 Radiculopathy, cervical region: Secondary | ICD-10-CM | POA: Diagnosis present

## 2018-04-14 DIAGNOSIS — F319 Bipolar disorder, unspecified: Secondary | ICD-10-CM | POA: Diagnosis present

## 2018-04-14 DIAGNOSIS — R079 Chest pain, unspecified: Secondary | ICD-10-CM | POA: Diagnosis not present

## 2018-04-14 DIAGNOSIS — R0602 Shortness of breath: Secondary | ICD-10-CM | POA: Diagnosis not present

## 2018-04-14 DIAGNOSIS — Z882 Allergy status to sulfonamides status: Secondary | ICD-10-CM

## 2018-04-14 DIAGNOSIS — R112 Nausea with vomiting, unspecified: Secondary | ICD-10-CM | POA: Diagnosis present

## 2018-04-14 DIAGNOSIS — E872 Acidosis: Secondary | ICD-10-CM | POA: Diagnosis present

## 2018-04-14 DIAGNOSIS — Z79899 Other long term (current) drug therapy: Secondary | ICD-10-CM

## 2018-04-14 DIAGNOSIS — M542 Cervicalgia: Secondary | ICD-10-CM | POA: Diagnosis not present

## 2018-04-14 DIAGNOSIS — E785 Hyperlipidemia, unspecified: Secondary | ICD-10-CM | POA: Diagnosis not present

## 2018-04-14 DIAGNOSIS — M5020 Other cervical disc displacement, unspecified cervical region: Secondary | ICD-10-CM | POA: Diagnosis not present

## 2018-04-14 DIAGNOSIS — R569 Unspecified convulsions: Secondary | ICD-10-CM | POA: Diagnosis not present

## 2018-04-14 DIAGNOSIS — R296 Repeated falls: Secondary | ICD-10-CM | POA: Diagnosis present

## 2018-04-14 DIAGNOSIS — I10 Essential (primary) hypertension: Secondary | ICD-10-CM | POA: Diagnosis present

## 2018-04-14 DIAGNOSIS — J441 Chronic obstructive pulmonary disease with (acute) exacerbation: Secondary | ICD-10-CM | POA: Diagnosis not present

## 2018-04-14 HISTORY — DX: Unspecified convulsions: R56.9

## 2018-04-14 LAB — BASIC METABOLIC PANEL
Anion gap: 9 (ref 5–15)
BUN: 12 mg/dL (ref 6–20)
CO2: 27 mmol/L (ref 22–32)
Calcium: 9.7 mg/dL (ref 8.9–10.3)
Chloride: 102 mmol/L (ref 101–111)
Creatinine, Ser: 0.77 mg/dL (ref 0.44–1.00)
GFR calc Af Amer: >60 mL/min (ref >60)
GFR calc non Af Amer: >60 mL/min (ref >60)
Glucose, Bld: 142 mg/dL — ABNORMAL HIGH (ref 65–99)
Potassium: 4.2 mmol/L (ref 3.5–5.1)
Sodium: 138 mmol/L (ref 135–145)

## 2018-04-14 LAB — CBC WITH DIFFERENTIAL/PLATELET
Basophils Absolute: 0 10*3/uL (ref 0.0–0.1)
Basophils Relative: 0 %
Eosinophils Absolute: 0 10*3/uL (ref 0.0–0.7)
Eosinophils Relative: 0 %
HCT: 41.8 % (ref 36.0–46.0)
Hemoglobin: 14.1 g/dL (ref 12.0–15.0)
Lymphocytes Relative: 10 %
Lymphs Abs: 1.8 10*3/uL (ref 0.7–4.0)
MCH: 30.7 pg (ref 26.0–34.0)
MCHC: 33.7 g/dL (ref 30.0–36.0)
MCV: 90.9 fL (ref 78.0–100.0)
Monocytes Absolute: 0 10*3/uL — ABNORMAL LOW (ref 0.1–1.0)
Monocytes Relative: 0 %
Neutro Abs: 16.1 10*3/uL — ABNORMAL HIGH (ref 1.7–7.7)
Neutrophils Relative %: 90 %
Platelets: 377 10*3/uL (ref 150–400)
RBC: 4.6 MIL/uL (ref 3.87–5.11)
RDW: 13.5 % (ref 11.5–15.5)
WBC: 18 10*3/uL — ABNORMAL HIGH (ref 4.0–10.5)

## 2018-04-14 LAB — I-STAT TROPONIN, ED: Troponin i, poc: 0 ng/mL (ref 0.00–0.08)

## 2018-04-14 MED ORDER — IPRATROPIUM BROMIDE 0.02 % IN SOLN
0.5000 mg | Freq: Once | RESPIRATORY_TRACT | Status: AC
  Administered 2018-04-14: 0.5 mg via RESPIRATORY_TRACT
  Filled 2018-04-14: qty 2.5

## 2018-04-14 MED ORDER — METHYLPREDNISOLONE SODIUM SUCC 125 MG IJ SOLR
125.0000 mg | Freq: Once | INTRAMUSCULAR | Status: AC
  Administered 2018-04-15: 125 mg via INTRAVENOUS
  Filled 2018-04-14: qty 2

## 2018-04-14 MED ORDER — ACETAMINOPHEN 325 MG PO TABS
650.0000 mg | ORAL_TABLET | Freq: Once | ORAL | Status: AC
  Administered 2018-04-14: 650 mg via ORAL
  Filled 2018-04-14: qty 2

## 2018-04-14 MED ORDER — ALBUTEROL (5 MG/ML) CONTINUOUS INHALATION SOLN
5.0000 mg/h | INHALATION_SOLUTION | Freq: Once | RESPIRATORY_TRACT | Status: AC
  Administered 2018-04-14: 5 mg/h via RESPIRATORY_TRACT
  Filled 2018-04-14: qty 20

## 2018-04-14 MED ORDER — FENTANYL CITRATE (PF) 100 MCG/2ML IJ SOLN
50.0000 ug | Freq: Once | INTRAMUSCULAR | Status: AC
  Administered 2018-04-14: 50 ug via INTRAVENOUS
  Filled 2018-04-14: qty 2

## 2018-04-14 MED ORDER — LACTATED RINGERS IV BOLUS
500.0000 mL | Freq: Once | INTRAVENOUS | Status: AC
  Administered 2018-04-15: 500 mL via INTRAVENOUS

## 2018-04-14 MED ORDER — IPRATROPIUM-ALBUTEROL 0.5-2.5 (3) MG/3ML IN SOLN
3.0000 mL | Freq: Once | RESPIRATORY_TRACT | Status: AC
  Administered 2018-04-14: 3 mL via RESPIRATORY_TRACT
  Filled 2018-04-14: qty 3

## 2018-04-14 NOTE — ED Notes (Signed)
ED Provider at bedside. 

## 2018-04-14 NOTE — ED Triage Notes (Signed)
Per EMS, pt coming from home with complaints of constant nausea and vomiting since a spinal fusion procedure she had this morning that she went under general anesthesia for. Pt has incision in her neck currently with a c collar in place. Pt stated she started having chest pain around 5pm today after she got discharged. Pt had the procedure at [facility].

## 2018-04-14 NOTE — ED Provider Notes (Signed)
[facility] EMERGENCY DEPARTMENT Provider Note   CSN: [id number] Arrival date & time: 04/14/18  2000     History   Chief Complaint Chief Complaint  Patient presents with  . Emesis  . Chest Pain    HPI KEALIE BARRIE is a 57 y.o. female.  Patient with outpatient cervical spine fusion today in which she was intubated and had surgery with Dr. Arnoldo Morale with neurosurgery.  Patient was intubated for the procedure.  She has felt shortness of breath and chest pain and some nausea since going home.  Patient has not taken any of her home pain medications at this time.  Patient denies any bleeding from surgical site.  No numbness or tingling down her arms.  No weakness.  The history is provided by the patient.  Shortness of Breath  This is a chronic problem. The current episode started 3 to 5 hours ago. The problem has not changed since onset.Associated symptoms include neck pain and chest pain. Pertinent negatives include no fever, no sore throat, no ear pain, no cough, no vomiting, no abdominal pain and no rash. She has tried nothing for the symptoms. The treatment provided no relief. Associated medical issues include COPD.    Past Medical History:  Diagnosis Date  . ALLERGIC RHINITIS 05/12/2009   Qualifier: Diagnosis of  By: Lenn Cal Deborra Medina), Wynona Canes    . Anemia, iron deficiency 12/22/2014  . Angina   . Anxiety   . Arthritis   . Asthma   . ASTHMA 05/12/2009   Severe AFL (Spirometry 05/2009: pre-BD FEV1 0.87L 34% pred, post-BD FEV1 1.11L 44% pred) Volumes hyperinflated Decreased DLCO that does not fully correct to normal range for alveolar volume.     Marland Kitchen COPD 08/24/2009   Qualifier: Diagnosis of  By: Burnett Kanaris    . COPD (chronic obstructive pulmonary disease) ([place])   . Depression   . Fibromyalgia 05/14/2014  . GERD (gastroesophageal reflux disease)   . Hyperlipidemia 04/20/2017  . HYPERTENSION 05/12/2009   Qualifier: Diagnosis of  By: Lenn Cal Deborra Medina), Wynona Canes      . Migraine   . Nephrolithiasis   . Peripheral vascular disease ([address])   . Prediabetes 02/23/2014  . Urticaria     Patient Active Problem List   Diagnosis Date Noted  . Cervical radiculopathy 02/18/2018  . 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 Syncope 09/04/2017  . Constipation 09/04/2017  . Nonintractable headache 09/04/2017  . Leg swelling 07/26/2017  . 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 Hyperlipidemia 04/20/2017  . Pedal edema 04/19/2017  . Bipolar I disorder ([place]) 09/19/2016  . Facial pain 07/12/2016  . Rash 06/14/2016  . Migraine 01/25/2016  . Idiopathic urticaria/pruritus 01/23/2016  . Perennial allergic rhinitis 01/23/2016  . Oral candidiasis 01/23/2016  . Greater trochanteric bursitis of both hips 09/08/2015  . Chest pain 06/22/2015  . Itching 06/22/2015  . Bilateral knee pain 06/22/2015  . Bilateral hip pain 06/22/2015  . Anemia, iron deficiency 12/22/2014  . Fatigue 12/21/2014  . Intertrigo 08/07/2014  . Dyspareunia 07/12/2014  . Menopausal syndrome (hot flushes) 07/12/2014  . History of tobacco abuse 07/12/2014  . Dizziness 06/22/2014  . Weakness 06/22/2014  . Fibromyalgia 05/14/2014  . Routine general medical examination at a health care facility 04/22/2014  . Recurrent boils 04/22/2014  . Recurrent falls 04/22/2014  . Peripheral vascular disease ([place])   . Depression   . Anxiety   . GERD (gastroesophageal reflux disease)   . Prediabetes 02/23/2014  . Screening mammogram for high-risk  patient 02/23/2014  . Back pain 07/22/2013  . COPD (chronic obstructive pulmonary disease) ([place]) 08/24/2009  . Headache(784.0) 08/24/2009  . HTN (hypertension) 05/12/2009  . Allergic rhinitis 05/12/2009  . Asthma 05/12/2009    Past Surgical History:  Procedure Laterality Date  . ABDOMINAL HYSTERECTOMY    . BACK SURGERY    . COLONOSCOPY  12/20/2011   Procedure: COLONOSCOPY;  Surgeon: Landry Dyke, MD;  Location: [facility];  Service: Endoscopy;   Laterality: N/A;  . COLONOSCOPY  03/05/2012   Procedure: COLONOSCOPY;  Surgeon: Landry Dyke, MD;  Location: [facility];  Service: Endoscopy;  Laterality: N/A;  . DIAGNOSTIC LAPAROSCOPY    . HEMORRHOID SURGERY    . INCISE AND DRAIN ABCESS    . KIDNEY STONE SURGERY    . NECK SURGERY    . TOE SURGERY    . TUBAL LIGATION       OB History    Gravida  7   Para  3   Term  3   Preterm      AB  4   Living  3     SAB  3   TAB  1   Ectopic      Multiple      Live Births               Home Medications    Prior to Admission medications   Medication Sig Start Date End Date Taking? Authorizing Provider  albuterol (PROVENTIL HFA;VENTOLIN HFA) 108 (90 Base) MCG/ACT inhaler INHALE 1 TO 2 PUFFS BY MOUTH EVERY 4 TO 6 HOURS AS NEEDED Patient taking differently: INHALE 1 TO 2 PUFFS BY MOUTH EVERY 4 TO 6 HOURS AS NEEDED FOR SHORTNESS OF BREATH 03/10/18  Yes Collene Gobble, MD  albuterol (PROVENTIL) (2.5 MG/3ML) 0.083% nebulizer solution Take 2.5 mg by nebulization every 4 (four) hours as needed for wheezing or shortness of breath. Reported on 06/04/2016   Yes [provider]  cetirizine (ZYRTEC) 10 MG tablet Take 1 tablet (10 mg total) by mouth daily. Patient taking differently: Take 10 mg by mouth daily as needed for allergies.  08/26/17  Yes Collene Gobble, MD  estradiol (CLIMARA - DOSED IN MG/24 HR) 0.05 mg/24hr patch Place 1 patch (0.05 mg total) onto the skin once a week. 01/15/18  Yes Princess Bruins, MD  fluticasone (FLONASE) 50 MCG/ACT nasal spray Place 2 sprays into both nostrils daily. Patient taking differently: Place 2 sprays into both nostrils daily as needed for allergies.  10/16/16  Yes Collene Gobble, MD  Fluticasone-Umeclidin-Vilant (TRELEGY ELLIPTA) 100-62.5-25 MCG/INH AEPB Inhale 1 puff into the lungs daily. 01/09/18  Yes Valentina Shaggy, MD  hydrochlorothiazide (MICROZIDE) 12.5 MG capsule Take 1 capsule (12.5 mg total) by mouth daily. 04/19/17  04/19/18 Yes Biagio Borg, MD  levocetirizine (XYZAL) 5 MG tablet TAKE 1 TABLET(5 MG) BY MOUTH DAILY AS NEEDED FOR ALLERGIES 12/30/17  Yes Bobbitt, Sedalia Muta, MD  Omalizumab Arvid Right Phillips) Inject 1 Applicatorful into the skin every 30 (thirty) days.   Yes [provider]  pravastatin (PRAVACHOL) 40 MG tablet Take 1 tablet (40 mg total) by mouth daily. 04/20/17  Yes Biagio Borg, MD  ranitidine (ZANTAC) 150 MG tablet Take 1 tablet (150 mg total) by mouth 2 (two) times daily as needed for heartburn. 08/26/17  Yes Collene Gobble, MD  tiZANidine (ZANAFLEX) 4 MG tablet TAKE 1 TABLET(4 MG) BY MOUTH EVERY 6 HOURS AS NEEDED FOR MUSCLE SPASMS 07/23/17  Yes Biagio Borg, MD  divalproex (DEPAKOTE ER) 500 MG 24 hr tablet Take 1 tablet (500 mg total) by mouth daily. Patient not taking: Reported on 04/14/2018 10/24/16   Arfeen, Arlyce Harman, MD  levETIRAcetam (KEPPRA) 500 MG tablet Take 1 tablet (500 mg total) by mouth 2 (two) times daily. Patient not taking: Reported on 04/14/2018 10/09/17   Tat, Eustace Quail, DO    Family History Family History  Problem Relation Age of Onset  . Diabetes Mother   . COPD Mother   . Heart disease Mother   . Asthma Mother   . Diabetes Father   . Kidney disease Father   . Cancer Father   . Anesthesia problems Father   . Alcohol abuse Father   . Diabetes Sister   . Hypertension Sister   . Diabetes Brother   . Sleep apnea Brother   . Asthma Brother   . Alcohol abuse Brother   . Heart disease Sister   . Diabetes Sister   . Brain cancer Sister   . Heart disease Brother   . Asthma Sister   . Allergic rhinitis Neg Hx   . Eczema Neg Hx   . Urticaria Neg Hx     Social History Social History   Tobacco Use  . Smoking status: Former Smoker    Packs/day: 0.50    Years: 36.00    Pack years: 18.00    Types: Cigarettes    Last attempt to quit: 04/05/2017    Years since quitting: 1.0  . Smokeless tobacco: Never Used  . Tobacco comment: quit 02/2016 but started back --Pt  states she is "an off and on smoker."    Substance Use Topics  . Alcohol use: Yes    Alcohol/week: 0.0 oz    Comment: drinks red wine occasionally  . Drug use: Yes    Types: Marijuana    Comment: TWICE A MONTH      Allergies   Sulfa antibiotics and Sulfonamide derivatives   Review of Systems Review of Systems  Constitutional: Negative for chills and fever.  HENT: Negative for ear pain and sore throat.   Eyes: Negative for pain and visual disturbance.  Respiratory: Positive for shortness of breath. Negative for cough.   Cardiovascular: Positive for chest pain. Negative for palpitations.  Gastrointestinal: Negative for abdominal pain and vomiting.  Genitourinary: Negative for dysuria and hematuria.  Musculoskeletal: Positive for neck pain. Negative for arthralgias and back pain.  Skin: Negative for color change and rash.  Neurological: Negative for seizures and syncope.  All other systems reviewed and are negative.    Physical Exam Updated Vital Signs  ED Triage Vitals  Enc Vitals Group     BP 04/14/18 2030 (!) 156/104     Pulse Rate 04/14/18 2030 86     Resp 04/14/18 2045 12     Temp --      Temp src --      SpO2 04/14/18 2006 95 %     Weight 04/14/18 2012 170 lb (77.1 kg)     Height 04/14/18 2012 5\' 3"  (1.6 m)     Head Circumference --      Peak Flow --      Pain Score 04/14/18 2012 8     Pain Loc --      Pain Edu? --      Excl. in [place]? --     Physical Exam  Constitutional: She is oriented to person, place, and time. She appears well-developed  and well-nourished. She appears distressed.  HENT:  Head: Normocephalic and atraumatic.  Eyes: Pupils are equal, round, and reactive to light. Conjunctivae and EOM are normal.  Neck: Normal range of motion. Neck supple. No tracheal deviation present. No thyromegaly present.  Patient with well-healing surgical wound, no signs of bleeding, expanding hematoma, no bruit  Cardiovascular: Normal rate, regular rhythm, intact  distal pulses and normal pulses.  No murmur heard. Pulmonary/Chest: Effort normal. Tachypnea noted. No respiratory distress. She has decreased breath sounds. She has wheezes.  Abdominal: Soft. There is no tenderness.  Musculoskeletal: Normal range of motion. She exhibits no edema.       Right lower leg: She exhibits no edema.       Left lower leg: She exhibits no edema.  Lymphadenopathy:    She has no cervical adenopathy.  Neurological: She is alert and oriented to person, place, and time.  5+/5 strength bilaterally in the right upper and left upper extremity.  Normal sensation throughout right and left upper extremity  Skin: Skin is warm and dry.  Psychiatric: She has a normal mood and affect.  Nursing note and vitals reviewed.    ED Treatments / Results  Labs (all labs ordered are listed, but only abnormal results are displayed) Labs Reviewed  CBC WITH DIFFERENTIAL/PLATELET - Abnormal; Notable for the following components:      Result Value   WBC 18.0 (*)    Neutro Abs 16.1 (*)    Monocytes Absolute 0.0 (*)    All other components within normal limits  BASIC METABOLIC PANEL - Abnormal; Notable for the following components:   Glucose, Bld 142 (*)    All other components within normal limits  I-STAT TROPONIN, ED    EKG None  Radiology Dg Chest 2 View  Result Date: 04/14/2018 CLINICAL DATA:  57 y/o  F; nausea and vomiting. EXAM: CHEST - 2 VIEW COMPARISON:  01/14/2018 chest radiograph FINDINGS: Stable normal cardiac silhouette. Aortic atherosclerosis with calcification. Clear lungs. No pleural effusion or pneumothorax. No acute osseous abnormality is evident. IMPRESSION: No acute pulmonary process identified. Electronically Signed   By: Kristine Garbe M.D.   On: 04/14/2018 21:06    Procedures Procedures (including critical care time)  Medications Ordered in ED Medications  lactated ringers bolus 500 mL (has no administration in time range)  methylPREDNISolone  sodium succinate (SOLU-MEDROL) 125 mg/2 mL injection 125 mg (has no administration in time range)  ipratropium-albuterol (DUONEB) 0.5-2.5 (3) MG/3ML nebulizer solution 3 mL (3 mLs Nebulization Given 04/14/18 2025)  fentaNYL (SUBLIMAZE) injection 50 mcg (50 mcg Intravenous Given 04/14/18 2025)  acetaminophen (TYLENOL) tablet 650 mg (650 mg Oral Given 04/14/18 2119)  albuterol (PROVENTIL,VENTOLIN) solution continuous neb (5 mg/hr Nebulization Given 04/14/18 2124)  ipratropium (ATROVENT) nebulizer solution 0.5 mg (0.5 mg Nebulization Given 04/14/18 2125)     Initial Impression / Assessment and Plan / ED Course  I have reviewed the triage vital signs and the nursing notes.  Pertinent labs & imaging results that were available during my care of the patient were reviewed by me and considered in my medical decision making (see chart for details).     BRELYNN WHELLER is a 57 year old female with a history of COPD, asthma, hypertension who presents to the ED with shortness of breath, nausea.  Patient with hypertension upon arrival but otherwise normal vitals.  Patient had cervical fusion surgery today with neurosurgery at outpatient surgical center.  She was intubated for procedure.  Patient states since being home  she has had general pain, shortness of breath, chest pain.  Patient has not taken any of her prescribed pain medicine and has not used her inhalers.  Patient states no bleeding from her surgical site.  No neck pain.  No numbness and tingling in her arms.  No weakness in her arms.  Patient has wheezing on exam with diminished air movement.  Surgical scar appears well, no expanding hematoma, no bruit. Patient appears to have normal neurological exam specifically in her upper extremities.  Patient states chest pain mostly with taking a deep breath in.  Patient has never had a PE or DVT.  Patient is not hypoxic or tachycardic.  Doubt PE at this time.  History and physical is most consistent with mild  asthma/COPD exacerbation.  Likely secondary to being intubated today.  Exam is reassuring from a neurovascular standpoint in the neck.  Patient does have an anterior cervical scar from an anterior approach but no signs to suggest neurovascular compromise on exam.  History and physical is not consistent with ACS.  EKG showed normal sinus rhythm with no signs of ischemic changes.  Patient had negative troponin and doubt ACS.  Patient had chest x-ray that showed no signs of pneumonia, pneumothorax, pleural effusion.  Patient had mild leukocytosis which is likely secondary from surgery today.  Otherwise kidney function within normal limits. Patient given duo-nebs and continuous albuterol/ipratropium. Ordered incentive spirometry. Patient given fentanyl and already given zofran by EMS.  Upon re-evaluation patient feeling improved, but still with diminished breath sounds and believe patient would benefit from further breathing treatments and pulmonary toilet and steroids and IV bolus.  Patient to be admitted to hospitalist service for further care.  I did discuss the patient with neurosurgery with Dr. Cyndy Freeze as that patient is here and they were reassured with her history and physical and no need for further workup.  Recommend continued pain control.  Patient admitted in stable condition.  Final Clinical Impressions(s) / ED Diagnoses   Final diagnoses:  Acute exacerbation of chronic obstructive pulmonary disease (COPD) ([place])  Post-op pain    ED Discharge Orders    None       Lennice Sites, DO 04/14/18 2339    Noemi Chapel, MD 04/15/18 [phone]

## 2018-04-14 NOTE — ED Provider Notes (Signed)
The patient is a 57 year old female, she has a known history of COPD, had a anterior cervical fusion of her neck earlier in the day and has developed some increasing shortness of breath since that time.  Initially the patient was significantly wheezy, dyspneic and speaking in short sentences, after multiple nebulized treatments she has improved but is still hypoxic to 91% on room air, tachycardic to 120 and tremulous likely from the bronchodilator therapy.  Chest x-ray negative for infections or pneumothorax, lab work also unremarkable, neurosurgery aware of the patient and is in agreement that this is not likely related to the surgery.  The patient endorses that she does not take her medicines regularly.  Will need to be admitted for further pulmonary care as she is persistently SOB and mildly hypoxic, though she feels better at this time  I saw and evaluated the patient, reviewed the resident's note and I agree with the findings and plan.   Final diagnoses:  Acute exacerbation of chronic obstructive pulmonary disease (COPD) ([facility])      Noemi Chapel, MD 04/15/18 [phone]

## 2018-04-14 NOTE — ED Notes (Signed)
Pt back in room from X-ray.

## 2018-04-14 NOTE — H&P (Addendum)
History and Physical    ROSALENE WARDROP LKT:[id number] DOB: 10-Nov-1961 DOA: 04/14/2018  Referring MD/NP/PA:   PCP: Biagio Borg, MD   Patient coming from:  The patient is coming from home.  At baseline, pt is independent for most of ADL.   Chief Complaint: SOB  HPI: JANEENE SAND is a 57 y.o. female with medical history significant of COPD, asthma, hypertension, hyperlipidemia, GERD, depression with anxiety, PVD, seizure, who presents with shortness of breath.  Pt states that she has hx of cervical radiculopathy. She underwent outpatient cervical spine fusion procedure by Dr. Arnoldo Morale with neurosurgery today. She was intubated during the procedure. She states that she felt SOB since went home. She also has chest tightness. She has mild cough with pink colored mucus production. No fever or chill. She also reports nausea and vomited several times. Patient denies diarrhea or abdominal pain. She denies any bleeding from surgical site.  No numbness or tingling down her arms. No symptoms of UTI or unilateral weakness.  ED Course: pt was found to have WBC 18.0, negative troponin, electrolytes renal function okay, tachycardia, no tachypnea, O2 sat are 93-98% on room air, temperature normal. Negative chest x-ray. Patient is placed on telemetry bed for observation.    Review of Systems:   General: no fevers, chills, no body weight gain, has poor appetite, has fatigue HEENT: no blurry vision, hearing changes or sore throat Respiratory: has dyspnea, coughing, no wheezing CV: has chest tightness. no palpitations GI: has nausea, vomiting, no abdominal pain, diarrhea, constipation GU: no dysuria, burning on urination, increased urinary frequency, hematuria  Ext: no leg edema Neuro: no unilateral weakness, numbness, or tingling, no vision change or hearing loss Skin: no rash, no skin tear. MSK: No muscle spasm, no deformity, no limitation of range of movement in spin Heme: No easy bruising.   Travel history: No recent long distant travel.  Allergy:  Allergies  Allergen Reactions  . Sulfa Antibiotics Nausea And Vomiting  . Sulfonamide Derivatives Nausea And Vomiting    REACTION: n/v    Past Medical History:  Diagnosis Date  . ALLERGIC RHINITIS 05/12/2009   Qualifier: Diagnosis of  By: Lenn Cal Deborra Medina), Wynona Canes    . Anemia, iron deficiency 12/22/2014  . Angina   . Anxiety   . Arthritis   . Asthma   . ASTHMA 05/12/2009   Severe AFL (Spirometry 05/2009: pre-BD FEV1 0.87L 34% pred, post-BD FEV1 1.11L 44% pred) Volumes hyperinflated Decreased DLCO that does not fully correct to normal range for alveolar volume.     Marland Kitchen COPD 08/24/2009   Qualifier: Diagnosis of  By: Burnett Kanaris    . COPD (chronic obstructive pulmonary disease) ([place])   . Depression   . Fibromyalgia 05/14/2014  . GERD (gastroesophageal reflux disease)   . Hyperlipidemia 04/20/2017  . HYPERTENSION 05/12/2009   Qualifier: Diagnosis of  By: Lenn Cal Deborra Medina), Wynona Canes    . Migraine   . Nephrolithiasis   . Peripheral vascular disease ([place])   . Prediabetes 02/23/2014  . Seizure ([place])   . Urticaria     Past Surgical History:  Procedure Laterality Date  . ABDOMINAL HYSTERECTOMY    . BACK SURGERY    . COLONOSCOPY  12/20/2011   Procedure: COLONOSCOPY;  Surgeon: Landry Dyke, MD;  Location: [facility];  Service: Endoscopy;  Laterality: N/A;  . COLONOSCOPY  03/05/2012   Procedure: COLONOSCOPY;  Surgeon: Landry Dyke, MD;  Location: [facility];  Service: Endoscopy;  Laterality: N/A;  . DIAGNOSTIC LAPAROSCOPY    .  HEMORRHOID SURGERY    . INCISE AND DRAIN ABCESS    . KIDNEY STONE SURGERY    . NECK SURGERY    . TOE SURGERY    . TUBAL LIGATION      Social History:  reports that she quit smoking about a year ago. Her smoking use included cigarettes. She has a 18.00 pack-year smoking history. She has never used smokeless tobacco. She reports that she drinks alcohol. She reports that she has current or past drug  history. Drug: Marijuana.  Family History:  Family History  Problem Relation Age of Onset  . Diabetes Mother   . COPD Mother   . Heart disease Mother   . Asthma Mother   . Diabetes Father   . Kidney disease Father   . Cancer Father   . Anesthesia problems Father   . Alcohol abuse Father   . Diabetes Sister   . Hypertension Sister   . Diabetes Brother   . Sleep apnea Brother   . Asthma Brother   . Alcohol abuse Brother   . Heart disease Sister   . Diabetes Sister   . Brain cancer Sister   . Heart disease Brother   . Asthma Sister   . Allergic rhinitis Neg Hx   . Eczema Neg Hx   . Urticaria Neg Hx      Prior to Admission medications   Medication Sig Start Date End Date Taking? Authorizing Provider  albuterol (PROVENTIL HFA;VENTOLIN HFA) 108 (90 Base) MCG/ACT inhaler INHALE 1 TO 2 PUFFS BY MOUTH EVERY 4 TO 6 HOURS AS NEEDED Patient taking differently: INHALE 1 TO 2 PUFFS BY MOUTH EVERY 4 TO 6 HOURS AS NEEDED FOR SHORTNESS OF BREATH 03/10/18  Yes Collene Gobble, MD  albuterol (PROVENTIL) (2.5 MG/3ML) 0.083% nebulizer solution Take 2.5 mg by nebulization every 4 (four) hours as needed for wheezing or shortness of breath. Reported on 06/04/2016   Yes [provider]  cetirizine (ZYRTEC) 10 MG tablet Take 1 tablet (10 mg total) by mouth daily. Patient taking differently: Take 10 mg by mouth daily as needed for allergies.  08/26/17  Yes Collene Gobble, MD  estradiol (CLIMARA - DOSED IN MG/24 HR) 0.05 mg/24hr patch Place 1 patch (0.05 mg total) onto the skin once a week. 01/15/18  Yes Princess Bruins, MD  fluticasone (FLONASE) 50 MCG/ACT nasal spray Place 2 sprays into both nostrils daily. Patient taking differently: Place 2 sprays into both nostrils daily as needed for allergies.  10/16/16  Yes Collene Gobble, MD  Fluticasone-Umeclidin-Vilant (TRELEGY ELLIPTA) 100-62.5-25 MCG/INH AEPB Inhale 1 puff into the lungs daily. 01/09/18  Yes Valentina Shaggy, MD    hydrochlorothiazide (MICROZIDE) 12.5 MG capsule Take 1 capsule (12.5 mg total) by mouth daily. 04/19/17 04/19/18 Yes Biagio Borg, MD  levocetirizine (XYZAL) 5 MG tablet TAKE 1 TABLET(5 MG) BY MOUTH DAILY AS NEEDED FOR ALLERGIES 12/30/17  Yes Bobbitt, Sedalia Muta, MD  Omalizumab Arvid Right [place]) Inject 1 Applicatorful into the skin every 30 (thirty) days.   Yes [provider]  pravastatin (PRAVACHOL) 40 MG tablet Take 1 tablet (40 mg total) by mouth daily. 04/20/17  Yes Biagio Borg, MD  ranitidine (ZANTAC) 150 MG tablet Take 1 tablet (150 mg total) by mouth 2 (two) times daily as needed for heartburn. 08/26/17  Yes Collene Gobble, MD  tiZANidine (ZANAFLEX) 4 MG tablet TAKE 1 TABLET(4 MG) BY MOUTH EVERY 6 HOURS AS NEEDED FOR MUSCLE SPASMS 07/23/17  Yes Cathlean Cower  W, MD  divalproex (DEPAKOTE ER) 500 MG 24 hr tablet Take 1 tablet (500 mg total) by mouth daily. Patient not taking: Reported on 04/14/2018 10/24/16   Arfeen, Arlyce Harman, MD  levETIRAcetam (KEPPRA) 500 MG tablet Take 1 tablet (500 mg total) by mouth 2 (two) times daily. Patient not taking: Reported on 04/14/2018 10/09/17   TatEustace Quail, DO    Physical Exam: Vitals:   04/15/18 0008 04/15/18 0045 04/15/18 0100 04/15/18 0115  BP:  140/89 (!) 142/98 128/83  Pulse:  (!) 106 (!) 110 (!) 104  Resp:      Temp: 98.6 F (37 C)     TempSrc: Oral     SpO2:  96% 91% 93%  Weight:      Height:       General: Not in acute distress HEENT: s/p of cervical spine fusion anteriorly, surgical site is covered with gauze.  Pt has C collar.        Eyes: PERRL, EOMI, no scleral icterus.       ENT: No discharge from the ears and nose, no pharynx injection, no tonsillar enlargement.        Neck: No JVD, no bruit, no mass felt. Heme: No neck lymph node enlargement. Cardiac: S1/S2, RRR, No murmurs, No gallops or rubs. Respiratory: severely deceased air movement bilaterally. No rales, wheezing, rhonchi or rubs. GI: Soft, nondistended, nontender, no rebound  pain, no organomegaly, BS present. GU: No hematuria Ext: No pitting leg edema bilaterally. 2+DP/PT pulse bilaterally. Musculoskeletal: No joint deformities, No joint redness or warmth, no limitation of ROM in spin. Skin: No rashes.  Neuro: Alert, oriented X3, cranial nerves II-XII grossly intact, moves all extremities normally.  Psych: Patient is not psychotic, no suicidal or hemocidal ideation.  Labs on Admission: I have personally reviewed following labs and imaging studies  CBC: Recent Labs  Lab 04/14/18 2020  WBC 18.0*  NEUTROABS 16.1*  HGB 14.1  HCT 41.8  MCV 90.9  PLT 629   Basic Metabolic Panel: Recent Labs  Lab 04/14/18 2020  NA 138  K 4.2  CL 102  CO2 27  GLUCOSE 142*  BUN 12  CREATININE 0.77  CALCIUM 9.7   GFR: Estimated Creatinine Clearance: 78.1 mL/min (by C-G formula based on SCr of 0.77 mg/dL). Liver Function Tests: No results for input(s): AST, ALT, ALKPHOS, BILITOT, PROT, ALBUMIN in the last 168 hours. No results for input(s): LIPASE, AMYLASE in the last 168 hours. No results for input(s): AMMONIA in the last 168 hours. Coagulation Profile: No results for input(s): INR, PROTIME in the last 168 hours. Cardiac Enzymes: No results for input(s): CKTOTAL, CKMB, CKMBINDEX, TROPONINI in the last 168 hours. BNP (last 3 results) Recent Labs    04/19/17 1636  PROBNP 15.0   HbA1C: No results for input(s): HGBA1C in the last 72 hours. CBG: No results for input(s): GLUCAP in the last 168 hours. Lipid Profile: No results for input(s): CHOL, HDL, LDLCALC, TRIG, CHOLHDL, LDLDIRECT in the last 72 hours. Thyroid Function Tests: No results for input(s): TSH, T4TOTAL, FREET4, T3FREE, THYROIDAB in the last 72 hours. Anemia Panel: No results for input(s): VITAMINB12, FOLATE, FERRITIN, TIBC, IRON, RETICCTPCT in the last 72 hours. Urine analysis:    Component Value Date/Time   COLORURINE YELLOW 10/10/2017 0805   APPEARANCEUR CLEAR 10/10/2017 0805   LABSPEC  <=1.005 (A) 10/10/2017 0805   PHURINE 6.5 10/10/2017 0805   GLUCOSEU NEGATIVE 10/10/2017 0805   HGBUR TRACE-LYSED (A) 10/10/2017 0805   BILIRUBINUR NEGATIVE  10/10/2017 0805   KETONESUR NEGATIVE 10/10/2017 0805   PROTEINUR NEG 11/02/2014 1657   UROBILINOGEN 0.2 10/10/2017 0805   NITRITE NEGATIVE 10/10/2017 0805   LEUKOCYTESUR NEGATIVE 10/10/2017 0805   Sepsis Labs: @LABRCNTIP (procalcitonin:4,lacticidven:4) )No results found for this or any previous visit (from the past 240 hour(s)).   Radiological Exams on Admission: Dg Chest 2 View  Result Date: 04/14/2018 CLINICAL DATA:  57 y/o  F; nausea and vomiting. EXAM: CHEST - 2 VIEW COMPARISON:  01/14/2018 chest radiograph FINDINGS: Stable normal cardiac silhouette. Aortic atherosclerosis with calcification. Clear lungs. No pleural effusion or pneumothorax. No acute osseous abnormality is evident. IMPRESSION: No acute pulmonary process identified. Electronically Signed   By: Kristine Garbe M.D.   On: 04/14/2018 21:06     EKG: Independently reviewed. Sinus rhythm, QTC 457, no ischemic change.   Assessment/Plan Principal Problem:   COPD with acute exacerbation (HCC) Active Problems:   HTN (hypertension)   Hyperlipidemia   Cervical radiculopathy   Leukocytosis   Seizure (HCC)   Nausea & vomiting   COPD with acute exacerbation ([place]): pt does not have wheezing or rhonchi on auscultation, but has severely decreased air movement bilaterally, indicating possible COPD exacerbation. This is likely triggered by intubation during the procedure.  -will place on tele bed for obs -Nebulizers: scheduled Atrovent and prn Xopenex Nebs -continue home fluticasone-Umeclidin-vilant inhaler -Solu-Medrol 60 mg IV bid -doxycyline -Mucinex for cough  -Incentive spirometry -Urine S. pneumococcal antigen -Follow up blood culture x2, sputum culture, Flu pcr -Nasal cannula oxygen as needed to maintain O2 saturation 92% or greater  HTN:  Bp  150/94 -Microzide -IV hydralazine prn  HLD: -pravastatin  GERD: -Pepcid   Hx of Seizure: use to be on Keppra and Depakote, currently not taking these 2 medications. -Seizure precaution -When necessary had a level seizure  Leukocytosis: no fever. No source of infection identified except for COPD exacerbation. Likely due to stress induced to demargination, but she meets SIRS criteria with eukocytosis and tachycardia. Lactic acid 6.9. -will get Procalcitonin and trend lactic acid level -IVF:  500 of Ringer solution in ED, 2.5 L NS bolus, then 125 cc/h -follow up by CBC -f/u Blood culture -will get UA and Ux  Nausea & vomiting: no abdominal pain or diarrhea. Likely related to the procedure and intubation. No acute abnormality on physical examination. -check lipase -when necessary Zofran for nausea   Cervical radiculopathy: s/p of c spin fusion. EDP discussed with  Dr. Christella Noa of neurosugeon, " they were reassured with her history and physical and no need for further workup". -prn tylenol and percocet for pain    DVT ppx: SCD Code Status: Full code Family Communication:   Yes, patient's two daughters and son  at bed side Disposition Plan:  Anticipate discharge back to previous home environment Consults called:  Dr. Christella Noa of neurosugeon Admission status: Obs / tele    Date of Service 04/15/2018    Ivor Costa [facility] Pager [phone]  If 7PM-7AM, please contact night-coverage [email or web address] Password [email or web address] 04/15/2018, 1:30 AM

## 2018-04-14 NOTE — ED Notes (Signed)
Patient transported to X-ray 

## 2018-04-15 ENCOUNTER — Encounter (HOSPITAL_COMMUNITY): Payer: Self-pay | Admitting: Internal Medicine

## 2018-04-15 DIAGNOSIS — I739 Peripheral vascular disease, unspecified: Secondary | ICD-10-CM | POA: Diagnosis present

## 2018-04-15 DIAGNOSIS — R569 Unspecified convulsions: Secondary | ICD-10-CM | POA: Diagnosis not present

## 2018-04-15 DIAGNOSIS — K219 Gastro-esophageal reflux disease without esophagitis: Secondary | ICD-10-CM | POA: Diagnosis present

## 2018-04-15 DIAGNOSIS — G40909 Epilepsy, unspecified, not intractable, without status epilepticus: Secondary | ICD-10-CM

## 2018-04-15 DIAGNOSIS — Z87891 Personal history of nicotine dependence: Secondary | ICD-10-CM | POA: Diagnosis not present

## 2018-04-15 DIAGNOSIS — R296 Repeated falls: Secondary | ICD-10-CM | POA: Diagnosis present

## 2018-04-15 DIAGNOSIS — E872 Acidosis: Secondary | ICD-10-CM | POA: Diagnosis present

## 2018-04-15 DIAGNOSIS — M5412 Radiculopathy, cervical region: Secondary | ICD-10-CM | POA: Diagnosis not present

## 2018-04-15 DIAGNOSIS — F319 Bipolar disorder, unspecified: Secondary | ICD-10-CM | POA: Diagnosis present

## 2018-04-15 DIAGNOSIS — D72829 Elevated white blood cell count, unspecified: Secondary | ICD-10-CM | POA: Diagnosis not present

## 2018-04-15 DIAGNOSIS — R112 Nausea with vomiting, unspecified: Secondary | ICD-10-CM | POA: Diagnosis not present

## 2018-04-15 DIAGNOSIS — Z87442 Personal history of urinary calculi: Secondary | ICD-10-CM | POA: Diagnosis not present

## 2018-04-15 DIAGNOSIS — G8918 Other acute postprocedural pain: Secondary | ICD-10-CM | POA: Diagnosis present

## 2018-04-15 DIAGNOSIS — Z882 Allergy status to sulfonamides status: Secondary | ICD-10-CM | POA: Diagnosis not present

## 2018-04-15 DIAGNOSIS — I10 Essential (primary) hypertension: Secondary | ICD-10-CM | POA: Diagnosis not present

## 2018-04-15 DIAGNOSIS — M797 Fibromyalgia: Secondary | ICD-10-CM | POA: Diagnosis present

## 2018-04-15 DIAGNOSIS — Z8249 Family history of ischemic heart disease and other diseases of the circulatory system: Secondary | ICD-10-CM | POA: Diagnosis not present

## 2018-04-15 DIAGNOSIS — Z981 Arthrodesis status: Secondary | ICD-10-CM | POA: Diagnosis not present

## 2018-04-15 DIAGNOSIS — E785 Hyperlipidemia, unspecified: Secondary | ICD-10-CM | POA: Diagnosis not present

## 2018-04-15 DIAGNOSIS — Z79899 Other long term (current) drug therapy: Secondary | ICD-10-CM | POA: Diagnosis not present

## 2018-04-15 DIAGNOSIS — J441 Chronic obstructive pulmonary disease with (acute) exacerbation: Secondary | ICD-10-CM | POA: Diagnosis not present

## 2018-04-15 LAB — HIV ANTIBODY (ROUTINE TESTING W REFLEX): HIV Screen 4th Generation wRfx: NONREACTIVE

## 2018-04-15 LAB — INFLUENZA PANEL BY PCR (TYPE A & B)
Influenza A By PCR: NEGATIVE
Influenza B By PCR: NEGATIVE

## 2018-04-15 LAB — LACTIC ACID, PLASMA
Lactic Acid, Venous: 6.9 mmol/L (ref 0.5–1.9)
Lactic Acid, Venous: 4.7 mmol/L (ref 0.5–1.9)

## 2018-04-15 LAB — LIPASE, BLOOD: Lipase: 24 U/L (ref 11–51)

## 2018-04-15 LAB — STREP PNEUMONIAE URINARY ANTIGEN: Strep Pneumo Urinary Antigen: NEGATIVE

## 2018-04-15 LAB — PROCALCITONIN: Procalcitonin: <0.1 ng/mL

## 2018-04-15 MED ORDER — FLUTICASONE PROPIONATE 50 MCG/ACT NA SUSP
2.0000 | Freq: Every day | NASAL | Status: DC | PRN
  Filled 2018-04-15: qty 16

## 2018-04-15 MED ORDER — TIZANIDINE HCL 4 MG PO TABS
4.0000 mg | ORAL_TABLET | Freq: Four times a day (QID) | ORAL | Status: DC | PRN
  Filled 2018-04-15: qty 1

## 2018-04-15 MED ORDER — HYDROCHLOROTHIAZIDE 12.5 MG PO CAPS
12.5000 mg | ORAL_CAPSULE | Freq: Every day | ORAL | Status: DC
  Administered 2018-04-15 – 2018-04-17 (×3): 12.5 mg via ORAL
  Filled 2018-04-15 (×3): qty 1

## 2018-04-15 MED ORDER — ZOLPIDEM TARTRATE 5 MG PO TABS
5.0000 mg | ORAL_TABLET | Freq: Every evening | ORAL | Status: DC | PRN
  Administered 2018-04-15 – 2018-04-16 (×3): 5 mg via ORAL
  Filled 2018-04-15 (×3): qty 1

## 2018-04-15 MED ORDER — DOXYCYCLINE HYCLATE 100 MG PO TABS
100.0000 mg | ORAL_TABLET | Freq: Two times a day (BID) | ORAL | Status: DC
  Administered 2018-04-15 – 2018-04-17 (×5): 100 mg via ORAL
  Filled 2018-04-15 (×5): qty 1

## 2018-04-15 MED ORDER — FLUTICASONE-UMECLIDIN-VILANT 100-62.5-25 MCG/INH IN AEPB: 1.0000 | INHALATION_SPRAY | Freq: Every day | RESPIRATORY_TRACT | Status: DC

## 2018-04-15 MED ORDER — NICOTINE 21 MG/24HR TD PT24
21.0000 mg | MEDICATED_PATCH | Freq: Every day | TRANSDERMAL | Status: DC
  Administered 2018-04-15 – 2018-04-17 (×3): 21 mg via TRANSDERMAL
  Filled 2018-04-15 (×3): qty 1

## 2018-04-15 MED ORDER — LEVALBUTEROL HCL 1.25 MG/0.5ML IN NEBU
1.2500 mg | INHALATION_SOLUTION | Freq: Four times a day (QID) | RESPIRATORY_TRACT | Status: DC
  Administered 2018-04-15 – 2018-04-17 (×9): 1.25 mg via RESPIRATORY_TRACT
  Filled 2018-04-15 (×10): qty 0.5

## 2018-04-15 MED ORDER — LEVOCETIRIZINE DIHYDROCHLORIDE 5 MG PO TABS: 2.5000 mg | ORAL_TABLET | Freq: Every evening | ORAL | Status: DC

## 2018-04-15 MED ORDER — OXYCODONE-ACETAMINOPHEN 5-325 MG PO TABS
1.0000 | ORAL_TABLET | ORAL | Status: DC | PRN
  Administered 2018-04-15 – 2018-04-17 (×3): 1 via ORAL
  Filled 2018-04-15 (×4): qty 1

## 2018-04-15 MED ORDER — FAMOTIDINE 20 MG PO TABS: 20.0000 mg | ORAL_TABLET | Freq: Two times a day (BID) | ORAL | Status: DC | PRN

## 2018-04-15 MED ORDER — PHENOL 1.4 % MT LIQD
1.0000 | OROMUCOSAL | Status: DC | PRN
  Filled 2018-04-15: qty 177

## 2018-04-15 MED ORDER — HYDRALAZINE HCL 20 MG/ML IJ SOLN
5.0000 mg | INTRAMUSCULAR | Status: DC | PRN
  Administered 2018-04-16: 5 mg via INTRAVENOUS
  Filled 2018-04-15: qty 1

## 2018-04-15 MED ORDER — LEVALBUTEROL HCL 1.25 MG/0.5ML IN NEBU
1.2500 mg | INHALATION_SOLUTION | Freq: Four times a day (QID) | RESPIRATORY_TRACT | Status: DC
  Administered 2018-04-15 (×2): 1.25 mg via RESPIRATORY_TRACT
  Filled 2018-04-15 (×3): qty 0.5

## 2018-04-15 MED ORDER — SODIUM CHLORIDE 0.9 % IV BOLUS
1500.0000 mL | Freq: Once | INTRAVENOUS | Status: AC
  Administered 2018-04-15: 1500 mL via INTRAVENOUS

## 2018-04-15 MED ORDER — SODIUM CHLORIDE 0.9 % IV BOLUS
1000.0000 mL | Freq: Once | INTRAVENOUS | Status: AC
  Administered 2018-04-15: 1000 mL via INTRAVENOUS

## 2018-04-15 MED ORDER — DM-GUAIFENESIN ER 30-600 MG PO TB12: 1.0000 | ORAL_TABLET | Freq: Two times a day (BID) | ORAL | Status: DC | PRN

## 2018-04-15 MED ORDER — LORAZEPAM 2 MG/ML IJ SOLN: 1.0000 mg | INTRAMUSCULAR | Status: DC | PRN

## 2018-04-15 MED ORDER — ONDANSETRON HCL 4 MG/2ML IJ SOLN
4.0000 mg | Freq: Three times a day (TID) | INTRAMUSCULAR | Status: DC | PRN
Start: 2018-04-15 — End: 2018-04-17
  Administered 2018-04-15: 4 mg via INTRAVENOUS
  Filled 2018-04-15: qty 2

## 2018-04-15 MED ORDER — IPRATROPIUM BROMIDE 0.02 % IN SOLN
0.5000 mg | RESPIRATORY_TRACT | Status: DC
  Administered 2018-04-15 (×2): 0.5 mg via RESPIRATORY_TRACT
  Filled 2018-04-15 (×2): qty 2.5

## 2018-04-15 MED ORDER — IPRATROPIUM BROMIDE 0.02 % IN SOLN: 0.5000 mg | Freq: Four times a day (QID) | RESPIRATORY_TRACT | Status: DC

## 2018-04-15 MED ORDER — SODIUM CHLORIDE 0.9 % IV SOLN
INTRAVENOUS | Status: DC
  Administered 2018-04-15 (×3): via INTRAVENOUS

## 2018-04-15 MED ORDER — PRAVASTATIN SODIUM 40 MG PO TABS
40.0000 mg | ORAL_TABLET | Freq: Every day | ORAL | Status: DC
  Administered 2018-04-15 – 2018-04-17 (×3): 40 mg via ORAL
  Filled 2018-04-15 (×3): qty 1

## 2018-04-15 MED ORDER — LORATADINE 10 MG PO TABS
10.0000 mg | ORAL_TABLET | Freq: Two times a day (BID) | ORAL | Status: DC
  Administered 2018-04-15 – 2018-04-17 (×5): 10 mg via ORAL
  Filled 2018-04-15 (×5): qty 1

## 2018-04-15 MED ORDER — METHYLPREDNISOLONE SODIUM SUCC 125 MG IJ SOLR
60.0000 mg | Freq: Two times a day (BID) | INTRAMUSCULAR | Status: DC
  Administered 2018-04-15 – 2018-04-17 (×5): 60 mg via INTRAVENOUS
  Filled 2018-04-15 (×5): qty 2

## 2018-04-15 MED ORDER — ACETAMINOPHEN 325 MG PO TABS
650.0000 mg | ORAL_TABLET | Freq: Four times a day (QID) | ORAL | Status: DC | PRN
  Administered 2018-04-15 – 2018-04-16 (×3): 650 mg via ORAL
  Filled 2018-04-15 (×3): qty 2

## 2018-04-15 MED ORDER — FLUTICASONE FUROATE-VILANTEROL 100-25 MCG/INH IN AEPB
1.0000 | INHALATION_SPRAY | Freq: Every day | RESPIRATORY_TRACT | Status: DC
  Administered 2018-04-15 – 2018-04-17 (×3): 1 via RESPIRATORY_TRACT
  Filled 2018-04-15 (×3): qty 28

## 2018-04-15 MED ORDER — DOXYCYCLINE HYCLATE 100 MG PO TABS: 100.0000 mg | ORAL_TABLET | Freq: Two times a day (BID) | ORAL | Status: DC

## 2018-04-15 MED ORDER — UMECLIDINIUM BROMIDE 62.5 MCG/INH IN AEPB
1.0000 | INHALATION_SPRAY | Freq: Every day | RESPIRATORY_TRACT | Status: DC
  Administered 2018-04-15 – 2018-04-17 (×3): 1 via RESPIRATORY_TRACT
  Filled 2018-04-15 (×3): qty 7

## 2018-04-15 NOTE — ED Notes (Signed)
MD, NIU at bedside

## 2018-04-15 NOTE — ED Notes (Addendum)
Ordered pt's heart healthy/thin fluid lunch tray, per Larkin Ina - RN.

## 2018-04-15 NOTE — Progress Notes (Signed)
PROGRESS NOTE    [name]  WUJ:[id number] DOB: 27-Feb-1961 DOA: 04/14/2018 PCP: Biagio Borg, MD   Outpatient Specialists:     Brief Narrative:  [name] is a 57 y.o. female with medical history significant of COPD, asthma, hypertension, hyperlipidemia, GERD, depression with anxiety, PVD, seizure, who presents with shortness of breath.  Pt states that she has hx of cervical radiculopathy. She underwent outpatient cervical spine fusion procedure by Dr. Arnoldo Morale with neurosurgery today. She was intubated during the procedure. She states that she felt SOB since went home. She also has chest tightness. She has mild cough with pink colored mucus production. No fever or chill. She also reports nausea and vomited several times. Patient denies diarrhea or abdominal pain. She denies any bleeding from surgical site. No numbness or tingling down her arms. No symptoms of UTI or unilateral weakness.     Assessment & Plan:   Principal Problem:   COPD with acute exacerbation ([place]) Active Problems:   HTN (hypertension)   Hyperlipidemia   Cervical radiculopathy   Leukocytosis   Seizure (HCC)   Nausea & vomiting   COPD with acute exacerbation ([place]): pt does not have wheezing or rhonchi on auscultation, but has severely decreased air movement bilaterally, indicating possible COPD exacerbation. This is likely triggered by intubation during the procedure. -Nebulizers: scheduled Atrovent and prn Xopenex Nebs -continue home fluticasone-Umeclidin-vilant inhaler -Solu-Medrol 60 mg IV bid -doxycyline -Mucinex for cough  -Incentive spirometry  HTN:  Resume home meds  HLD: -pravastatin  GERD: -Pepcid   Hx of Seizure: use to be on Keppra and Depakote, currently not taking these 2 medications. -defer to outpatient follow up  Leukocytosis: ? Stress induced  Nausea & vomiting: no abdominal pain or diarrhea. Likely related to the procedure and intubation. No acute  abnormality on physical examination. -prn zofran   Cervical radiculopathy: s/p of c spin fusion. EDP discussed with  Dr. Christella Noa of neurosugeon, "they were reassured with her history and physical and no need for further workup". -prn tylenol and percocet for pain -added Dr. Arnoldo Morale as an FYI     DVT prophylaxis:  SCD's  Code Status: Full Code   Family Communication:   Disposition Plan:     Consultants:   Added Dr. Arnoldo Morale as an FYI   Subjective: Just had surgery yesterday, not sure what anesthesia she got but she was intubated  Objective: Vitals:   04/15/18 0636 04/15/18 0801 04/15/18 1011 04/15/18 1138  BP:  (!) 141/75 (!) 144/88   Pulse:  (!) 106 97 (!) 107  Resp:  (!) 23 15 (!) 24  Temp: 98.7 F (37.1 C)     TempSrc: Axillary     SpO2:  93% 98% 93%  Weight:      Height:        Intake/Output Summary (Last 24 hours) at 04/15/2018 1349 Last data filed at 04/15/2018 9562 Gross per 24 hour  Intake 6500 ml  Output -  Net 6500 ml   Filed Weights   04/14/18 2012  Weight: 77.1 kg (170 lb)    Examination:  General exam: in bed, mild increased work of breathing Respiratory system: tight, no wheezing Cardiovascular system: tachy Gastrointestinal system: +Bs, soft Central nervous system: alert Extremities: Symmetric 5 x 5 power. Skin: No rashes, lesions or ulcers     Data Reviewed: I have personally reviewed following labs and imaging studies  CBC: Recent Labs  Lab 04/14/18 2020  WBC 18.0*  NEUTROABS 16.1*  HGB  14.1  HCT 41.8  MCV 90.9  PLT 749   Basic Metabolic Panel: Recent Labs  Lab 04/14/18 2020  NA 138  K 4.2  CL 102  CO2 27  GLUCOSE 142*  BUN 12  CREATININE 0.77  CALCIUM 9.7   GFR: Estimated Creatinine Clearance: 78.1 mL/min (by C-G formula based on SCr of 0.77 mg/dL). Liver Function Tests: No results for input(s): AST, ALT, ALKPHOS, BILITOT, PROT, ALBUMIN in the last 168 hours. Recent Labs  Lab 04/15/18 0027  LIPASE  24   No results for input(s): AMMONIA in the last 168 hours. Coagulation Profile: No results for input(s): INR, PROTIME in the last 168 hours. Cardiac Enzymes: No results for input(s): CKTOTAL, CKMB, CKMBINDEX, TROPONINI in the last 168 hours. BNP (last 3 results) Recent Labs    04/19/17 1636  PROBNP 15.0   HbA1C: No results for input(s): HGBA1C in the last 72 hours. CBG: No results for input(s): GLUCAP in the last 168 hours. Lipid Profile: No results for input(s): CHOL, HDL, LDLCALC, TRIG, CHOLHDL, LDLDIRECT in the last 72 hours. Thyroid Function Tests: No results for input(s): TSH, T4TOTAL, FREET4, T3FREE, THYROIDAB in the last 72 hours. Anemia Panel: No results for input(s): VITAMINB12, FOLATE, FERRITIN, TIBC, IRON, RETICCTPCT in the last 72 hours. Urine analysis:    Component Value Date/Time   COLORURINE YELLOW 10/10/2017 0805   APPEARANCEUR CLEAR 10/10/2017 0805   LABSPEC <=1.005 (A) 10/10/2017 0805   PHURINE 6.5 10/10/2017 0805   GLUCOSEU NEGATIVE 10/10/2017 0805   HGBUR TRACE-LYSED (A) 10/10/2017 0805   BILIRUBINUR NEGATIVE 10/10/2017 0805   KETONESUR NEGATIVE 10/10/2017 0805   PROTEINUR NEG 11/02/2014 1657   UROBILINOGEN 0.2 10/10/2017 0805   NITRITE NEGATIVE 10/10/2017 0805   LEUKOCYTESUR NEGATIVE 10/10/2017 0805     )No results found for this or any previous visit (from the past 240 hour(s)).    Anti-infectives (From admission, onward)   Start     Dose/Rate Route Frequency Ordered Stop   04/15/18 1000  doxycycline (VIBRA-TABS) tablet 100 mg  Status:  Discontinued     100 mg Oral Every 12 hours 04/15/18 0637 04/15/18 0639   04/15/18 0645  doxycycline (VIBRA-TABS) tablet 100 mg     100 mg Oral Every 12 hours 04/15/18 4496         Radiology Studies: Dg Chest 2 View  Result Date: 04/14/2018 CLINICAL DATA:  57 y/o  F; nausea and vomiting. EXAM: CHEST - 2 VIEW COMPARISON:  01/14/2018 chest radiograph FINDINGS: Stable normal cardiac silhouette. Aortic  atherosclerosis with calcification. Clear lungs. No pleural effusion or pneumothorax. No acute osseous abnormality is evident. IMPRESSION: No acute pulmonary process identified. Electronically Signed   By: Kristine Garbe M.D.   On: 04/14/2018 21:06        Scheduled Meds: . doxycycline  100 mg Oral Q12H  . fluticasone furoate-vilanterol  1 puff Inhalation Daily   Or  . umeclidinium bromide  1 puff Inhalation Daily  . hydrochlorothiazide  12.5 mg Oral Daily  . levalbuterol  1.25 mg Nebulization Q6H  . loratadine  10 mg Oral BID  . methylPREDNISolone (SOLU-MEDROL) injection  60 mg Intravenous Q12H  . nicotine  21 mg Transdermal Daily  . pravastatin  40 mg Oral q1800   Continuous Infusions:   LOS: 0 days    Time spent: 25 min    Geradine Girt, DO [facility] Pager [phone]  If 7PM-7AM, please contact night-coverage [email or web address] Password [email or web address] 04/15/2018, 1:49 PM

## 2018-04-15 NOTE — ED Notes (Addendum)
Pt unable to eat meal. Informed Larkin Ina - RN. Called [facility] and ordered pt strained cream of chicken soup and vanilla ice cream. Pt given applesauce until lunch arrives.

## 2018-04-15 NOTE — ED Notes (Signed)
MD in to see patient at this time.

## 2018-04-15 NOTE — ED Notes (Signed)
Date and time results received: 04/15/18 0455  Test: Lactic Acid Critical Value: 6.9  Name of Provider Notified: Dr.Niu  Orders Received? Or Actions Taken?: No new orders at this time

## 2018-04-15 NOTE — Progress Notes (Signed)
[name] [id number] Admission Data: 04/15/2018 7:02 PM Attending Provider: Geradine Girt, DO  TML:[id number], [name], MD Consults/ Treatment Team: Treatment Team:  Newman Pies, MD  [name] is a 57 y.o. female patient admitted from ED awake, alert  & orientated  X 3,  Full Code, VSS - Blood pressure (!) 159/98, pulse (!) 107, temperature 98.6 F (37 C), temperature source Oral, resp. rate 16, height 5\' 3"  (1.6 m), weight 75.8 kg (167 lb 3.2 oz), SpO2 94 %., no c/o shortness of breath, no c/o chest pain, no distress noted. Tele # 24 placed and pt is currently running.   IV site WDL:  with a transparent dsg that's clean dry and intact.  Allergies:   Allergies  Allergen Reactions  . Sulfa Antibiotics Nausea And Vomiting  . Sulfonamide Derivatives Nausea And Vomiting    REACTION: n/v     Past Medical History:  Diagnosis Date  . ALLERGIC RHINITIS 05/12/2009   Qualifier: Diagnosis of  By: Lenn Cal Deborra Medina), Wynona Canes    . Anemia, iron deficiency 12/22/2014  . Angina   . Anxiety   . Arthritis   . Asthma   . ASTHMA 05/12/2009   Severe AFL (Spirometry 05/2009: pre-BD FEV1 0.87L 34% pred, post-BD FEV1 1.11L 44% pred) Volumes hyperinflated Decreased DLCO that does not fully correct to normal range for alveolar volume.     Marland Kitchen COPD 08/24/2009   Qualifier: Diagnosis of  By: Burnett Kanaris    . COPD (chronic obstructive pulmonary disease) ([place])   . Depression   . Fibromyalgia 05/14/2014  . GERD (gastroesophageal reflux disease)   . Hyperlipidemia 04/20/2017  . HYPERTENSION 05/12/2009   Qualifier: Diagnosis of  By: Lenn Cal Deborra Medina), Wynona Canes    . Migraine   . Nephrolithiasis   . Peripheral vascular disease ([place])   . Prediabetes 02/23/2014  . Seizure ([place])   . Urticaria     History:  obtained from chart review.  Pt orientation to unit, room and routine. Information packet given to patient/family.  Admission INP armband ID verified with patient/family, and in place. SR up x  2, fall risk assessment complete with Patient and family verbalizing understanding of risks associated with falls. Pt verbalizes an understanding of how to use the call bell and to call for help before getting out of bed.  Skin, clean-dry- intact without evidence of bruising, or skin tears.   No evidence of skin break down noted on exam.    Will cont to monitor and assist as needed.  Eliot Ford, RN 04/15/2018 7:02 PM

## 2018-04-16 ENCOUNTER — Encounter (HOSPITAL_COMMUNITY): Payer: Self-pay

## 2018-04-16 LAB — CBC
HCT: 34.9 % — ABNORMAL LOW (ref 36.0–46.0)
Hemoglobin: 11.4 g/dL — ABNORMAL LOW (ref 12.0–15.0)
MCH: 29.8 pg (ref 26.0–34.0)
MCHC: 32.7 g/dL (ref 30.0–36.0)
MCV: 91.4 fL (ref 78.0–100.0)
Platelets: 350 10*3/uL (ref 150–400)
RBC: 3.82 MIL/uL — ABNORMAL LOW (ref 3.87–5.11)
RDW: 13.8 % (ref 11.5–15.5)
WBC: 31.4 10*3/uL — ABNORMAL HIGH (ref 4.0–10.5)

## 2018-04-16 LAB — BASIC METABOLIC PANEL
Anion gap: 9 (ref 5–15)
BUN: 12 mg/dL (ref 6–20)
CO2: 27 mmol/L (ref 22–32)
Calcium: 9.5 mg/dL (ref 8.9–10.3)
Chloride: 104 mmol/L (ref 101–111)
Creatinine, Ser: 0.7 mg/dL (ref 0.44–1.00)
GFR calc Af Amer: >60 mL/min (ref >60)
GFR calc non Af Amer: >60 mL/min (ref >60)
Glucose, Bld: 133 mg/dL — ABNORMAL HIGH (ref 65–99)
Potassium: 4.3 mmol/L (ref 3.5–5.1)
Sodium: 140 mmol/L (ref 135–145)

## 2018-04-16 MED ORDER — HYDRALAZINE HCL 20 MG/ML IJ SOLN: 10.0000 mg | INTRAMUSCULAR | Status: DC | PRN

## 2018-04-16 NOTE — Progress Notes (Addendum)
PROGRESS NOTE    RICHELLE GLICK  OEU:[id number] DOB: 1961/03/15 DOA: 04/14/2018 PCP: Biagio Borg, MD   Outpatient Specialists:     Brief Narrative:  [name] is a 57 y.o. female with medical history significant of COPD, asthma, hypertension, hyperlipidemia, GERD, depression with anxiety, PVD, seizure, who presents with shortness of breath.  Pt states that she has hx of cervical radiculopathy. She underwent outpatient cervical spine fusion procedure by Dr. Arnoldo Morale with neurosurgery today. She was intubated during the procedure. She states that she felt SOB since went home. She also has chest tightness. She has mild cough with pink colored mucus production. No fever or chill. She also reports nausea and vomited several times. Patient denies diarrhea or abdominal pain. She denies any bleeding from surgical site. No numbness or tingling down her arms. No symptoms of UTI or unilateral weakness.     Assessment & Plan:   Principal Problem:   COPD with acute exacerbation ([place]) Active Problems:   HTN (hypertension)   Hyperlipidemia   Cervical radiculopathy   Leukocytosis   Seizure (HCC)   Nausea & vomiting   COPD with acute exacerbation ([place]):  -nebs-continue home fluticasone-Umeclidin-vilant inhaler -Solu-Medrol 60 mg IV bid -doxycyline -Mucinex for cough  -Incentive spirometry  HTN:  Resume home meds -may need adjustments if BP continues to be hight  HLD: -pravastatin  GERD: -Pepcid   Hx of Seizure: use to be on Keppra and Depakote, currently not taking these 2 medications. -defer to outpatient follow up  Leukocytosis: ? Stress/steroids induced -trend (no fevers) -diff in AM  Nausea & vomiting: no abdominal pain or diarrhea. Likely related to the procedure and intubation. No acute abnormality on physical examination. -prn zofran   Cervical radiculopathy: s/p of c spin fusion. EDP discussed with  Dr. Christella Noa of neurosugeon, "they were  reassured with her history and physical and no need for further workup". -prn tylenol and percocet for pain -added Dr. Arnoldo Morale as an Juluis Rainier and appreciated his note from 5/1  Lactic acidosis -trending down -recheck in AM     DVT prophylaxis:  SCD's  Code Status: Full Code   Family Communication:   Disposition Plan:     Consultants:   Dr. Arnoldo Morale   Subjective: Still tight but slightly improved  Objective: Vitals:   04/16/18 1144 04/16/18 1345 04/16/18 1359 04/16/18 1453  BP:  (!) 173/112 (!) 175/113   Pulse: 88 98  (!) 102  Resp: 18 20  18   Temp:  98.6 F (37 C)    TempSrc:  Oral    SpO2: 96% 96%  96%  Weight:      Height:        Intake/Output Summary (Last 24 hours) at 04/16/2018 1526 Last data filed at 04/16/2018 1400 Gross per 24 hour  Intake 120 ml  Output 950 ml  Net -830 ml   Filed Weights   04/14/18 2012 04/15/18 1544  Weight: 77.1 kg (170 lb) 75.8 kg (167 lb 3.2 oz)    Examination:  General exam: sitting on side of bed, appears comfortable Respiratory system: tight with expiratory wheezing, no increased work of breathing Cardiovascular system: regular Gastrointestinal system: +Bs, soft Skin: incision on front of neck with bandages     Data Reviewed: I have personally reviewed following labs and imaging studies  CBC: Recent Labs  Lab 04/14/18 2020 04/16/18 0427  WBC 18.0* 31.4*  NEUTROABS 16.1*  --   HGB 14.1 11.4*  HCT 41.8 34.9*  MCV 90.9  91.4  PLT 377 332   Basic Metabolic Panel: Recent Labs  Lab 04/14/18 2020 04/16/18 0427  NA 138 140  K 4.2 4.3  CL 102 104  CO2 27 27  GLUCOSE 142* 133*  BUN 12 12  CREATININE 0.77 0.70  CALCIUM 9.7 9.5   GFR: Estimated Creatinine Clearance: 77.5 mL/min (by C-G formula based on SCr of 0.7 mg/dL). Liver Function Tests: No results for input(s): AST, ALT, ALKPHOS, BILITOT, PROT, ALBUMIN in the last 168 hours. Recent Labs  Lab 04/15/18 0027  LIPASE 24   No results for input(s):  AMMONIA in the last 168 hours. Coagulation Profile: No results for input(s): INR, PROTIME in the last 168 hours. Cardiac Enzymes: No results for input(s): CKTOTAL, CKMB, CKMBINDEX, TROPONINI in the last 168 hours. BNP (last 3 results) Recent Labs    04/19/17 1636  PROBNP 15.0   HbA1C: No results for input(s): HGBA1C in the last 72 hours. CBG: No results for input(s): GLUCAP in the last 168 hours. Lipid Profile: No results for input(s): CHOL, HDL, LDLCALC, TRIG, CHOLHDL, LDLDIRECT in the last 72 hours. Thyroid Function Tests: No results for input(s): TSH, T4TOTAL, FREET4, T3FREE, THYROIDAB in the last 72 hours. Anemia Panel: No results for input(s): VITAMINB12, FOLATE, FERRITIN, TIBC, IRON, RETICCTPCT in the last 72 hours. Urine analysis:    Component Value Date/Time   COLORURINE YELLOW 10/10/2017 0805   APPEARANCEUR CLEAR 10/10/2017 0805   LABSPEC <=1.005 (A) 10/10/2017 0805   PHURINE 6.5 10/10/2017 0805   GLUCOSEU NEGATIVE 10/10/2017 0805   HGBUR TRACE-LYSED (A) 10/10/2017 0805   BILIRUBINUR NEGATIVE 10/10/2017 0805   KETONESUR NEGATIVE 10/10/2017 0805   PROTEINUR NEG 11/02/2014 1657   UROBILINOGEN 0.2 10/10/2017 0805   NITRITE NEGATIVE 10/10/2017 0805   LEUKOCYTESUR NEGATIVE 10/10/2017 0805     ) Recent Results (from the past 240 hour(s))  Culture, blood (routine x 2) Call MD if unable to obtain prior to antibiotics being given     Status: None (Preliminary result)   Collection Time: 04/15/18  3:29 AM  Result Value Ref Range Status   Specimen Description BLOOD RIGHT ANTECUBITAL  Final   Special Requests   Final    BOTTLES DRAWN AEROBIC AND ANAEROBIC Blood Culture adequate volume   Culture   Final    NO GROWTH 1 DAY Performed at [facility], [place] [address], [place], [place] [address]    Report Status PENDING  Incomplete  Culture, blood (routine x 2) Call MD if unable to obtain prior to antibiotics being given     Status: None (Preliminary result)    Collection Time: 04/15/18  3:43 AM  Result Value Ref Range Status   Specimen Description BLOOD RIGHT HAND  Final   Special Requests   Final    BOTTLES DRAWN AEROBIC AND ANAEROBIC Blood Culture adequate volume   Culture   Final    NO GROWTH 1 DAY Performed at [facility], [address] [place], [place] [address]    Report Status PENDING  Incomplete      Anti-infectives (From admission, onward)   Start     Dose/Rate Route Frequency Ordered Stop   04/15/18 1000  doxycycline (VIBRA-TABS) tablet 100 mg  Status:  Discontinued     100 mg Oral Every 12 hours 04/15/18 0637 04/15/18 0639   04/15/18 0645  doxycycline (VIBRA-TABS) tablet 100 mg     100 mg Oral Every 12 hours 04/15/18 6301  Radiology Studies: Dg Chest 2 View  Result Date: 04/14/2018 CLINICAL DATA:  57 y/o  F; nausea and vomiting. EXAM: CHEST - 2 VIEW COMPARISON:  01/14/2018 chest radiograph FINDINGS: Stable normal cardiac silhouette. Aortic atherosclerosis with calcification. Clear lungs. No pleural effusion or pneumothorax. No acute osseous abnormality is evident. IMPRESSION: No acute pulmonary process identified. Electronically Signed   By: Kristine Garbe M.D.   On: 04/14/2018 21:06        Scheduled Meds: . doxycycline  100 mg Oral Q12H  . fluticasone furoate-vilanterol  1 puff Inhalation Daily   Or  . umeclidinium bromide  1 puff Inhalation Daily  . hydrochlorothiazide  12.5 mg Oral Daily  . levalbuterol  1.25 mg Nebulization Q6H  . loratadine  10 mg Oral BID  . methylPREDNISolone (SOLU-MEDROL) injection  60 mg Intravenous Q12H  . nicotine  21 mg Transdermal Daily  . pravastatin  40 mg Oral q1800   Continuous Infusions:   LOS: 1 day    Time spent: 25 min    Geradine Girt, DO [facility] Pager [phone]  If 7PM-7AM, please contact night-coverage [email or web address] Password [email or web address] 04/16/2018, 3:26 PM

## 2018-04-16 NOTE — Progress Notes (Signed)
Subjective: [name] is a 57 year old black female with COPD on whom I performed a C6-7 anterior cervical discectomy, fusion and plating on 04/14/2018.  She did well was discharged.  She presented to the ER with some shortness of breath.  She has been admitted by Dr. Eliseo Squires for observation and treatment of her COPD.  Presently the patient is alert and pleasant.  She tells me her neck is doing well.  She denies dysphagia, dyspnea, etc.  She feels better today.  Objective: Vital signs in last 24 hours: Temp:  [98.6 F (37 C)-99.1 F (37.3 C)] 98.8 F (37.1 C) (05/01 0538) Pulse Rate:  [84-107] 92 (05/01 0538) Resp:  [15-24] 18 (05/01 0538) BP: (141-159)/(75-98) 152/95 (05/01 0538) SpO2:  [92 %-99 %] 94 % (05/01 0538) Weight:  [75.8 kg (167 lb 3.2 oz)] 75.8 kg (167 lb 3.2 oz) (04/30 1544) Estimated body mass index is 29.62 kg/m as calculated from the following:   Height as of this encounter: 5\' 3"  (1.6 m).   Weight as of this encounter: 75.8 kg (167 lb 3.2 oz).   Intake/Output from previous day: No intake/output data recorded. Intake/Output this shift: No intake/output data recorded.  Physical exam patient is alert and oriented.  Her strength is normal.  Her incision is healing.  There is no swelling, hematoma, shift, etc.  Lab Results: Recent Labs    04/14/18 2020 04/16/18 0427  WBC 18.0* 31.4*  HGB 14.1 11.4*  HCT 41.8 34.9*  PLT 377 350   BMET Recent Labs    04/14/18 2020 04/16/18 0427  NA 138 140  K 4.2 4.3  CL 102 104  CO2 27 27  GLUCOSE 142* 133*  BUN 12 12  CREATININE 0.77 0.70  CALCIUM 9.7 9.5    Studies/Results: Dg Chest 2 View  Result Date: 04/14/2018 CLINICAL DATA:  57 y/o  F; nausea and vomiting. EXAM: CHEST - 2 VIEW COMPARISON:  01/14/2018 chest radiograph FINDINGS: Stable normal cardiac silhouette. Aortic atherosclerosis with calcification. Clear lungs. No pleural effusion or pneumothorax. No acute osseous abnormality is evident. IMPRESSION:  No acute pulmonary process identified. Electronically Signed   By: Kristine Garbe M.D.   On: 04/14/2018 21:06    Assessment/Plan: Postop day #2: She is doing well neurologically.  Her wound looks good.  COPD exacerbation: I appreciate Dr. Gaynelle Adu excellent care of this patient.  LOS: 1 day     [name] 04/16/2018, 7:43 AM

## 2018-04-16 NOTE — Progress Notes (Signed)
Pt BP 175/113, given hydralazine 5mg  at  1359, rechecked BP at 1500 it was 144/95. Pt resting in bed. Will continue to monitor pt.

## 2018-04-17 LAB — CBC WITH DIFFERENTIAL/PLATELET
Basophils Absolute: 0 10*3/uL (ref 0.0–0.1)
Basophils Relative: 0 %
Eosinophils Absolute: 0 10*3/uL (ref 0.0–0.7)
Eosinophils Relative: 0 %
HCT: 36.8 % (ref 36.0–46.0)
Hemoglobin: 12 g/dL (ref 12.0–15.0)
Lymphocytes Relative: 7 %
Lymphs Abs: 1.8 10*3/uL (ref 0.7–4.0)
MCH: 29.5 pg (ref 26.0–34.0)
MCHC: 32.6 g/dL (ref 30.0–36.0)
MCV: 90.4 fL (ref 78.0–100.0)
Monocytes Absolute: 0.5 10*3/uL (ref 0.1–1.0)
Monocytes Relative: 2 %
Neutro Abs: 23.6 10*3/uL — ABNORMAL HIGH (ref 1.7–7.7)
Neutrophils Relative %: 91 %
Platelets: 356 10*3/uL (ref 150–400)
RBC: 4.07 MIL/uL (ref 3.87–5.11)
RDW: 13.6 % (ref 11.5–15.5)
WBC: 25.9 10*3/uL — ABNORMAL HIGH (ref 4.0–10.5)

## 2018-04-17 LAB — BASIC METABOLIC PANEL
Anion gap: 8 (ref 5–15)
BUN: 19 mg/dL (ref 6–20)
CO2: 28 mmol/L (ref 22–32)
Calcium: 9.6 mg/dL (ref 8.9–10.3)
Chloride: 102 mmol/L (ref 101–111)
Creatinine, Ser: 0.75 mg/dL (ref 0.44–1.00)
GFR calc Af Amer: >60 mL/min (ref >60)
GFR calc non Af Amer: >60 mL/min (ref >60)
Glucose, Bld: 149 mg/dL — ABNORMAL HIGH (ref 65–99)
Potassium: 4.5 mmol/L (ref 3.5–5.1)
Sodium: 138 mmol/L (ref 135–145)

## 2018-04-17 LAB — LACTIC ACID, PLASMA: Lactic Acid, Venous: 1 mmol/L (ref 0.5–1.9)

## 2018-04-17 MED ORDER — LEVALBUTEROL HCL 1.25 MG/0.5ML IN NEBU
1.2500 mg | INHALATION_SOLUTION | Freq: Four times a day (QID) | RESPIRATORY_TRACT | Status: DC | PRN
Start: 2018-04-17 — End: 2018-04-17

## 2018-04-17 MED ORDER — GUAIFENESIN ER 600 MG PO TB12: 1200.0000 mg | ORAL_TABLET | Freq: Two times a day (BID) | ORAL | 0 refills | Status: AC

## 2018-04-17 MED ORDER — LORAZEPAM 2 MG/ML IJ SOLN: 1.0000 mg | INTRAMUSCULAR | Status: DC | PRN

## 2018-04-17 MED ORDER — PREDNISONE 10 MG PO TABS: 40.0000 mg | ORAL_TABLET | Freq: Every day | ORAL | 0 refills | Status: AC

## 2018-04-17 MED ORDER — DOXYCYCLINE HYCLATE 100 MG PO TABS: 100.0000 mg | ORAL_TABLET | Freq: Two times a day (BID) | ORAL | 0 refills | Status: AC

## 2018-04-17 NOTE — Progress Notes (Signed)
Nsg Discharge Note  Admit Date:  04/14/2018 Discharge date: 04/17/2018   [name] to be D/C'd Home per MD order.  AVS completed.  Copy for chart, and copy for patient signed, and dated. Patient/caregiver able to verbalize understanding.  Discharge Medication: Allergies as of 04/17/2018      Reactions   Sulfa Antibiotics Nausea And Vomiting   Sulfonamide Derivatives Nausea And Vomiting   REACTION: n/v      Medication List    STOP taking these medications   divalproex 500 MG 24 hr tablet Commonly known as:  DEPAKOTE ER   levETIRAcetam 500 MG tablet Commonly known as:  KEPPRA   levocetirizine 5 MG tablet Commonly known as:  XYZAL     TAKE these medications   albuterol (2.5 MG/3ML) 0.083% nebulizer solution Commonly known as:  PROVENTIL Take 2.5 mg by nebulization every 4 (four) hours as needed for wheezing or shortness of breath. Reported on 06/04/2016 What changed:  Another medication with the same name was changed. Make sure you understand how and when to take each.   albuterol 108 (90 Base) MCG/ACT inhaler Commonly known as:  PROVENTIL HFA;VENTOLIN HFA INHALE 1 TO 2 PUFFS BY MOUTH EVERY 4 TO 6 HOURS AS NEEDED What changed:  See the new instructions.   cetirizine 10 MG tablet Commonly known as:  ZYRTEC Take 1 tablet (10 mg total) by mouth daily. What changed:    when to take this  reasons to take this   doxycycline 100 MG tablet Commonly known as:  VIBRA-TABS Take 1 tablet (100 mg total) by mouth every 12 (twelve) hours for 9 days.   estradiol 0.05 mg/24hr patch Commonly known as:  CLIMARA - Dosed in mg/24 hr Place 1 patch (0.05 mg total) onto the skin once a week.   fluticasone 50 MCG/ACT nasal spray Commonly known as:  FLONASE Place 2 sprays into both nostrils daily. What changed:    when to take this  reasons to take this   Fluticasone-Umeclidin-Vilant 100-62.5-25 MCG/INH Aepb Commonly known as:  TRELEGY ELLIPTA Inhale 1 puff into the lungs  daily.   guaiFENesin 600 MG 12 hr tablet Commonly known as:  MUCINEX Take 2 tablets (1,200 mg total) by mouth 2 (two) times daily for 5 days.   hydrochlorothiazide 12.5 MG capsule Commonly known as:  MICROZIDE Take 1 capsule (12.5 mg total) by mouth daily.   pravastatin 40 MG tablet Commonly known as:  PRAVACHOL Take 1 tablet (40 mg total) by mouth daily.   predniSONE 10 MG tablet Commonly known as:  DELTASONE Take 4 tablets (40 mg total) by mouth daily for 4 days.   ranitidine 150 MG tablet Commonly known as:  ZANTAC Take 1 tablet (150 mg total) by mouth 2 (two) times daily as needed for heartburn.   tiZANidine 4 MG tablet Commonly known as:  ZANAFLEX TAKE 1 TABLET(4 MG) BY MOUTH EVERY 6 HOURS AS NEEDED FOR MUSCLE SPASMS   XOLAIR [place] Inject 1 Applicatorful into the skin every 30 (thirty) days.       Discharge Assessment: Vitals:   04/17/18 1323 04/17/18 1449  BP: (!) 146/103 (!) 140/95  Pulse: (!) 108 93  Resp: 16 17  Temp: 98.2 F (36.8 C)   SpO2: 97% 96%   Skin clean, dry and intact without evidence of skin break down, no evidence of skin tears noted. IV catheter discontinued intact. Site without signs and symptoms of complications - no redness or edema noted at insertion site, patient denies  c/o pain - only slight tenderness at site.  Dressing with slight pressure applied.  D/c Instructions-Education: Discharge instructions given to patient/family with verbalized understanding. D/c education completed with patient/family including follow up instructions, medication list, d/c activities limitations if indicated, with other d/c instructions as indicated by MD - patient able to verbalize understanding, all questions fully answered. Patient instructed to return to ED, call 911, or call MD for any changes in condition.  Patient escorted via [place], and D/C home via private auto.  Felicia N Zaray Gatchel, RN 04/17/2018 8:05 PM

## 2018-04-17 NOTE — Discharge Instructions (Signed)
Chronic Obstructive Pulmonary Disease Exacerbation  Chronic obstructive pulmonary disease (COPD) is a common lung problem. In COPD, the flow of air from the lungs is limited. COPD exacerbations are times that breathing gets worse and you need extra treatment. Without treatment they can be life threatening. If they happen often, your lungs can become more damaged. If your COPD gets worse, your doctor may treat you with:  ? Medicines.  ? Oxygen.  ? Different ways to clear your airway, such as using a mask.    Follow these instructions at home:  ? Do not smoke.  ? Avoid tobacco smoke and other things that bother your lungs.  ? If given, take your antibiotic medicine as told. Finish the medicine even if you start to feel better.  ? Only take medicines as told by your doctor.  ? Drink enough fluids to keep your pee (urine) clear or pale yellow (unless your doctor has told you not to).  ? Use a cool mist machine (vaporizer).  ? If you use oxygen or a machine that turns liquid medicine into a mist (nebulizer), continue to use them as told.  ? Keep up with shots (vaccinations) as told by your doctor.  ? Exercise regularly.  ? Eat healthy foods.  ? Keep all doctor visits as told.  Get help right away if:  ? You are very short of breath and it gets worse.  ? You have trouble talking.  ? You have bad chest pain.  ? You have blood in your spit (sputum).  ? You have a fever.  ? You keep throwing up (vomiting).  ? You feel weak, or you pass out (faint).  ? You feel confused.  ? You keep getting worse.  This information is not intended to replace advice given to you by your health care provider. Make sure you discuss any questions you have with your health care provider.  Document Released: 11/22/2011 Document Revised: 05/10/2016 Document Reviewed: 08/07/2013  Elsevier Interactive Patient Education ? 2017 Elsevier Inc.

## 2018-04-17 NOTE — Care Management Note (Addendum)
Case Management Note  Patient Details  Name: JANETTA VANDOREN MRN: [id number] Date of Birth: 04/22/1961  Subjective/Objective:    Presents with SOB /COPD exacerabation, hx of COPD, asthma, hypertension, hyperlipidemia, GERD, depression with anxiety, PVD, seizure, hx of cervical radiculopathy. Pt states s/p cervical spinal  fusion recently. Resides alone.Pt states has fallen @ home and needs assistance with ADL's. Son Marta Antu assists with care if needed when he's not working. Pt owns DME: neck/back brace.  Lachrisha Ziebarth (Other) Melainie Krinsky [phone]      PCP: Cathlean Cower         5/2/209 @1245  ncm received call from [facility] and was told pt approved for [facility] 1st program. NCM has requested home health orders  (RN,PT,OT) from MD.       Action/Plan: Transition home with home health services when medically stable. NCM made referral for [facility] 1st program, liaison to f/u with NCM.   Pt will have transportation to home once d/c.  Expected Discharge Date:                  Expected Discharge Plan:  Home/Self Care  In-House Referral:     Discharge planning Services     Post Acute Care Choice:    Choice offered to:     DME Arranged:    DME Agency:     HH Arranged:   RN,PT,OT [place] Agency:   [place]  Status of Service:  Completed  If discussed at [place] of Stay Meetings, dates discussed:    Additional Comments:  Sharin Mons, RN 04/17/2018, 11:09 AM

## 2018-04-17 NOTE — Consult Note (Addendum)
   [facility] CM Inpatient Consult   04/17/2018  EMALENE WELTE 1961/07/19 [id number]    Made aware of [facility] First program enrollment by [facility] with [place] with Ms. Haldeman to discuss [place] Management's involvement if transportation to MD appointments and medication needs are identified while on the [place] program. Ms. Washburn expresses appreciation.  Also discussed that [place] staff will potentially refer back to [place] Management if community case management services are warranted post [place] enrollment.  Will send notification to [place] Management staff to make aware of [facility] First enrollment.   [name], MSN-Ed, RN,BSN [facility] Liaison [phone]

## 2018-04-18 DIAGNOSIS — Z4789 Encounter for other orthopedic aftercare: Secondary | ICD-10-CM | POA: Diagnosis not present

## 2018-04-18 DIAGNOSIS — J441 Chronic obstructive pulmonary disease with (acute) exacerbation: Secondary | ICD-10-CM | POA: Diagnosis not present

## 2018-04-19 DIAGNOSIS — Z4789 Encounter for other orthopedic aftercare: Secondary | ICD-10-CM | POA: Diagnosis not present

## 2018-04-19 DIAGNOSIS — J441 Chronic obstructive pulmonary disease with (acute) exacerbation: Secondary | ICD-10-CM | POA: Diagnosis not present

## 2018-04-20 LAB — CULTURE, BLOOD (ROUTINE X 2)
Culture: NO GROWTH
Culture: NO GROWTH
Special Requests: ADEQUATE
Special Requests: ADEQUATE

## 2018-04-21 ENCOUNTER — Telehealth: Payer: Self-pay | Admitting: Internal Medicine

## 2018-04-21 ENCOUNTER — Encounter: Payer: Self-pay | Admitting: Internal Medicine

## 2018-04-21 DIAGNOSIS — Z4789 Encounter for other orthopedic aftercare: Secondary | ICD-10-CM | POA: Diagnosis not present

## 2018-04-21 DIAGNOSIS — J441 Chronic obstructive pulmonary disease with (acute) exacerbation: Secondary | ICD-10-CM | POA: Diagnosis not present

## 2018-04-21 DIAGNOSIS — R269 Unspecified abnormalities of gait and mobility: Secondary | ICD-10-CM | POA: Insufficient documentation

## 2018-04-21 NOTE — Telephone Encounter (Signed)
Faxed

## 2018-04-21 NOTE — Telephone Encounter (Signed)
Done hardcopy to [place]  

## 2018-04-21 NOTE — Telephone Encounter (Signed)
Copied from [place] [id number]. Topic: Quick Communication - See Telephone Encounter >> Apr 21, 2018  8:56 AM Ahmed Prima L wrote: CRM for notification. See Telephone encounter for: 04/21/18.  Lemuel from [place] called and stated she she went to the hospital for SOB. She was discharged 5/2. He said she fell Friday evening 5/3, she hit the metal part on the bed. He just wanted to report this.  PT one time a week for one week, 2 times a week for 2 weeks & 1 time a week for 2 weeks. Please call back @ [phone].  He also would to request a prescription for a two wheel rolling walker to [facility] @ [phone]

## 2018-04-22 ENCOUNTER — Telehealth: Payer: Self-pay | Admitting: Internal Medicine

## 2018-04-22 ENCOUNTER — Ambulatory Visit (INDEPENDENT_AMBULATORY_CARE_PROVIDER_SITE_OTHER): Payer: Medicare Other | Admitting: *Deleted

## 2018-04-22 DIAGNOSIS — J441 Chronic obstructive pulmonary disease with (acute) exacerbation: Secondary | ICD-10-CM | POA: Diagnosis not present

## 2018-04-22 DIAGNOSIS — J4550 Severe persistent asthma, uncomplicated: Secondary | ICD-10-CM

## 2018-04-22 DIAGNOSIS — L501 Idiopathic urticaria: Secondary | ICD-10-CM | POA: Diagnosis not present

## 2018-04-22 DIAGNOSIS — Z4789 Encounter for other orthopedic aftercare: Secondary | ICD-10-CM | POA: Diagnosis not present

## 2018-04-22 NOTE — Telephone Encounter (Signed)
Ok for verbals 

## 2018-04-22 NOTE — Telephone Encounter (Signed)
Copied from [place] [phone]. Topic: Quick Communication - See Telephone Encounter >> Apr 22, 2018 11:58 AM Arletha Grippe wrote: CRM for notification. See Telephone encounter for: 04/22/18.  Don with [facility] called - needs verbal orders for OT  1 week 3 For adl , iadl, transfers, and dc when goals ,met for maximum potential.   [phone]

## 2018-04-22 NOTE — Telephone Encounter (Signed)
Called Don no answer LMOM w/MD response../lmb 

## 2018-04-23 DIAGNOSIS — Z4789 Encounter for other orthopedic aftercare: Secondary | ICD-10-CM | POA: Diagnosis not present

## 2018-04-23 DIAGNOSIS — J441 Chronic obstructive pulmonary disease with (acute) exacerbation: Secondary | ICD-10-CM | POA: Diagnosis not present

## 2018-04-24 DIAGNOSIS — Z4789 Encounter for other orthopedic aftercare: Secondary | ICD-10-CM | POA: Diagnosis not present

## 2018-04-24 DIAGNOSIS — J441 Chronic obstructive pulmonary disease with (acute) exacerbation: Secondary | ICD-10-CM | POA: Diagnosis not present

## 2018-04-25 DIAGNOSIS — J441 Chronic obstructive pulmonary disease with (acute) exacerbation: Secondary | ICD-10-CM | POA: Diagnosis not present

## 2018-04-25 DIAGNOSIS — Z4789 Encounter for other orthopedic aftercare: Secondary | ICD-10-CM | POA: Diagnosis not present

## 2018-04-29 DIAGNOSIS — J441 Chronic obstructive pulmonary disease with (acute) exacerbation: Secondary | ICD-10-CM | POA: Diagnosis not present

## 2018-04-29 DIAGNOSIS — Z4789 Encounter for other orthopedic aftercare: Secondary | ICD-10-CM | POA: Diagnosis not present

## 2018-04-30 DIAGNOSIS — Z4789 Encounter for other orthopedic aftercare: Secondary | ICD-10-CM | POA: Diagnosis not present

## 2018-04-30 DIAGNOSIS — J441 Chronic obstructive pulmonary disease with (acute) exacerbation: Secondary | ICD-10-CM | POA: Diagnosis not present

## 2018-05-01 ENCOUNTER — Telehealth: Payer: Self-pay | Admitting: Adult Health

## 2018-05-01 ENCOUNTER — Other Ambulatory Visit: Payer: Self-pay | Admitting: Emergency Medicine

## 2018-05-01 DIAGNOSIS — J441 Chronic obstructive pulmonary disease with (acute) exacerbation: Secondary | ICD-10-CM | POA: Diagnosis not present

## 2018-05-01 DIAGNOSIS — Z4789 Encounter for other orthopedic aftercare: Secondary | ICD-10-CM | POA: Diagnosis not present

## 2018-05-01 NOTE — Telephone Encounter (Signed)
Called spoke with patient [name] with 82min opening tomorrow afternoon 5.17.19 @ 1515 Patient okay with moving up her HFU appt to this date/time Patient aware to contact the office or seek emergency attention if her symptoms worsen prior to office visit Patient advised to please bring all her medications with her to her office visit tomorrow  Nothing further needed; will sign off

## 2018-05-01 NOTE — Telephone Encounter (Signed)
Called and spoke with patient. She states that her SOB is increasing as well as chest tightness when she exerts herself. She does not describe it as chest pain, just tightness when walking for a long amount of time. Denies any numbness or nausea. Patient has some congestion. This has been going on for 3 days. Patient has an appointment for a hospital follow up on 5.24 with TP. She is wanting to know if she can be seen sooner or if there is something she can do.   TP please advise, thank you.

## 2018-05-02 ENCOUNTER — Encounter: Payer: Self-pay | Admitting: Internal Medicine

## 2018-05-02 ENCOUNTER — Ambulatory Visit (INDEPENDENT_AMBULATORY_CARE_PROVIDER_SITE_OTHER): Payer: Medicare Other | Admitting: Internal Medicine

## 2018-05-02 VITALS — BP 124/88 | HR 99 | Ht 63.0 in | Wt 171.4 lb

## 2018-05-02 DIAGNOSIS — J441 Chronic obstructive pulmonary disease with (acute) exacerbation: Secondary | ICD-10-CM

## 2018-05-02 LAB — PULMONARY FUNCTION TEST
FEF 25-75 Pre: 0.45 L/sec
FEF2575-%Pred-Pre: 21 %
FEV1-%Pred-Pre: 52 %
FEV1-Pre: 1.06 L
FEV1FVC-%Pred-Pre: 69 %
FEV6-%Pred-Pre: 75 %
FEV6-Pre: 1.87 L
FEV6FVC-%Pred-Pre: 100 %
FVC-%Pred-Pre: 75 %
FVC-Pre: 1.92 L
Pre FEV1/FVC ratio: 55 %
Pre FEV6/FVC Ratio: 97 %

## 2018-05-02 MED ORDER — BUDESONIDE-FORMOTEROL FUMARATE 80-4.5 MCG/ACT IN AERO: 2.0000 | INHALATION_SPRAY | Freq: Two times a day (BID) | RESPIRATORY_TRACT | 11 refills | Status: DC

## 2018-05-02 MED ORDER — PREDNISONE 10 MG PO TABS: ORAL_TABLET | ORAL | 0 refills | Status: DC

## 2018-05-02 MED ORDER — PANTOPRAZOLE SODIUM 40 MG PO TBEC: DELAYED_RELEASE_TABLET | ORAL | 2 refills | Status: DC

## 2018-05-02 NOTE — Patient Instructions (Addendum)
Plan A = Automatic = symbicort 80 Take 2 puffs first thing in am and then another 2 puffs about 12 hours later.   Work on inhaler technique:  relax and gently blow all the way out then take a nice smooth deep breath back in, triggering the inhaler at same time you start breathing in.  Hold for up to 5 seconds if you can. Blow out thru nose. Rinse and gargle with water when done  If not improving:Prednisone 10 mg take  4 each am x 2 days,   2 each am x 2 days,  1 each am x 2 days and stop   Pantoprazole (protonix) 40 mg   Take  30-60 min before first meal of the day and Zantac 150 mg @  bedtime until return to office - this is the best way to tell whether stomach acid is contributing to your problem.    GERD (REFLUX)  is an extremely common cause of respiratory symptoms just like yours , many times with no obvious heartburn at all.    It can be treated with medication, but also with lifestyle changes including elevation of the head of your bed (ideally with 6 inch  bed blocks),  Smoking cessation, avoidance of late meals, excessive alcohol, and avoid fatty foods, chocolate, peppermint, colas, red wine, and acidic juices such as orange juice.  NO MINT OR MENTHOL PRODUCTS SO NO COUGH DROPS   USE SUGARLESS CANDY INSTEAD (Jolley ranchers or Stover's or Life Savers) or even ice chips will also do - the key is to swallow to prevent all throat clearing. NO OIL BASED VITAMINS - use powdered substitutes.    Plan B = Backup Only use your albuterol as a rescue medication to be used if you can't catch your breath by resting or doing a relaxed purse lip breathing pattern.  - The less you use it, the better it will work when you need it. - Ok to use the inhaler up to 2 puffs  every 4 hours if you must but call for appointment if use goes up over your usual need - Don't leave home without it !!  (think of it like the spare tire for your car)   Plan C = Crisis - only use your albuterol nebulizer if you first  try Plan B and it fails to help > ok to use the nebulizer up to every 4 hours but if start needing it regularly call for immediate appointment

## 2018-05-02 NOTE — Progress Notes (Signed)
Spirometry completed today.

## 2018-05-02 NOTE — Progress Notes (Signed)
Subjective:    Patient ID: [name], female    DOB: Jul 29, 1961,    MRN: [id number]  COPD  She complains of cough, shortness of breath and wheezing. Associated symptoms include chest pain. Pertinent negatives include no fever, postnasal drip or rhinorrhea. Her past medical history is significant for COPD.    Acute OV 11/16/16 -- patient has a history of severe obstructive lung disease with continued tobacco use, bipolar disorder, allergic rhinitis, frequent exacerbations. She has been managed on Xolair by her allergist. She was last seen here 1 month ago. Currently managed on Spiriva, Symbicort. We restarted Protonix, Flonase, Astelin nasal spray last visit as frequently exacerbating. She continues to smoke approximately 2 a day. She has noticed a L posterior flank and back pain 5 days ago. Has had continued to have cough although reports that congestion is better than last time. It is worst with twisting or laying flat. Bad when she coughs. Does not hurt when she is still. Breathing is better than last time - less nasal congestion.    ROV 02/11/18 --57 year old woman, active smoker (rare cigarettes now) with bipolar disorder, allergic rhinitis, COPD/asthma diagnosed around 2000.  Spirometry 05/2009 showed severe obstruction (34% predicted FEV1).  She has been managed with Xolair by her allergist, getting once a month.  I had not seen her since 11/16/16.  She was seen here by [name] on 1/29 with symptoms consistent with an acute exacerbation. Finished abx and pred taper with good improvement. PCP changed her to Trelegy 1 month ago - she prefers it, believes that she has benefited from this.  She is starting to have a lot of nasal congestion and mucous especially in the am, associated with cough. She is on flonase and zyrtec, singulair. Has astelin NS available. Not on xyzal. She is using albuterol about 1x a day.    rec We will continue Trelegy 1 inhalation daily. Congratulations on  decreasing your smoking!  Please continue to work hard on stopping altogether. Continue to follow with your allergist and to receive your Xolair treatments once a month. Continue fluticasone nasal spray, Zyrtec, Singulair, Astelin nasal spray as you have been using them. Keep albuterol available to use 2 puffs up to every 4 hours if needed for shortness of breath, wheezing, chest tightness. Continue Protonix 40 mg daily   Neck surgery 04/14/18 then sob same night > ER >  Admit with dx copd exac and d/c on 04/17/18    05/02/2018   extended ov/[name] re: post hosp f/u sob - quit smoking 04/11/18  Chief Complaint  Patient presents with  . Hospitalization Follow-up    Breathing is not quite back to her normal baseline. She had been having some chest tightness, mucinex has helped. She has been using her albuterol inhaler 3 x daily on average and neb once every night.   avg maybe one daily albuterol preop and mostly just when overdoes exertion eg steps Post op lots more and has not cut back down as constant sense of throat and chest congestion/ tightness  Assoc with sore throat / some hoarseness/ no dysphagia saba use   helps chest tightness transiently up to 3 -4 h  Sleeps ok 1 pillow    No obvious day to day or daytime variability or assoc excess/ purulent sputum or mucus plugs or hemoptysis or cp or   subjective wheeze or overt sinus or hb symptoms. No unusual exposure hx or h/o childhood pna/ asthma or knowledge of premature birth.  Sleeping ok on 1 pillow  without nocturnal  or early am exacerbation  of respiratory  c/o's or need for noct saba. Also denies any obvious fluctuation of symptoms with weather or environmental changes or other aggravating or alleviating factors except as outlined above   Current Allergies, Complete Past Medical History, Past Surgical History, Family History, and Social History were reviewed in [employer] record.  ROS  The following are not  active complaints unless bolded Hoarseness, sore throat, dysphagia, dental problems, itching, sneezing,  nasal congestion or discharge of excess mucus or purulent secretions, ear ache,   fever, chills, sweats, unintended wt loss or wt gain, classically pleuritic or exertional cp,  orthopnea pnd or arm/hand swelling  or leg swelling, presyncope, palpitations, abdominal pain, anorexia, nausea, vomiting, diarrhea  or change in bowel habits or change in bladder habits, change in stools or change in urine, dysuria, hematuria,  rash, arthralgias, visual complaints, headache, numbness, weakness or ataxia or problems with walking or coordination,  change in mood or  memory.        Current Meds  Medication Sig  . albuterol (PROVENTIL HFA;VENTOLIN HFA) 108 (90 Base) MCG/ACT inhaler INHALE 1 TO 2 PUFFS BY MOUTH EVERY 4 TO 6 HOURS AS NEEDED (Patient taking differently: INHALE 1 TO 2 PUFFS BY MOUTH EVERY 4 TO 6 HOURS AS NEEDED FOR SHORTNESS OF BREATH)  . albuterol (PROVENTIL) (2.5 MG/3ML) 0.083% nebulizer solution Take 2.5 mg by nebulization every 4 (four) hours as needed for wheezing or shortness of breath. Reported on 06/04/2016  . cetirizine (ZYRTEC) 10 MG tablet TAKE 1 TABLET(10 MG) BY MOUTH DAILY  . estradiol (CLIMARA - DOSED IN MG/24 HR) 0.05 mg/24hr patch Place 1 patch (0.05 mg total) onto the skin once a week.  . fluticasone (FLONASE) 50 MCG/ACT nasal spray Place 2 sprays into both nostrils daily. (Patient taking differently: Place 2 sprays into both nostrils daily as needed for allergies. )  . Omalizumab (XOLAIR [place]) Inject 1 Applicatorful into the skin every 30 (thirty) days.  . pravastatin (PRAVACHOL) 40 MG tablet Take 1 tablet (40 mg total) by mouth daily.  . ranitidine (ZANTAC) 150 MG tablet Take 1 tablet (150 mg total) by mouth 2 (two) times daily as needed for heartburn.  Marland Kitchen tiZANidine (ZANAFLEX) 4 MG tablet TAKE 1 TABLET(4 MG) BY MOUTH EVERY 6 HOURS AS NEEDED FOR MUSCLE SPASMS  . [   Fluticasone-Umeclidin-Vilant (TRELEGY ELLIPTA) 100-62.5-25 MCG/INH AEPB Inhale 1 puff into the lungs daily.   Current Facility-Administered Medications for the 05/02/18 encounter (Office Visit) with Tanda Rockers, MD  Medication  . omalizumab Arvid Right) injection 300 mg                            Objective:   Physical Exam   Hoarse amb bf nad with prominent pseudowheeze   Wt Readings from Last 3 Encounters:  05/02/18 171 lb 6.4 oz (77.7 kg)  04/15/18 167 lb 3.2 oz (75.8 kg)  02/24/18 179 lb (81.2 kg)     Vital signs reviewed - Note on arrival 02 sats  99% on RA      HEENT: nl dentition, turbinates bilaterally, and oropharynx. Nl external ear canals without cough reflex   NECK :  without JVD/Nodes/TM/ nl carotid upstrokes bilaterally   LUNGS: no acc muscle use,  Nl contour chest which is clear to A and P bilaterally without cough on insp or exp maneuvers   CV:  RRR  no s3 or murmur or increase in P2, and no edema   ABD:  soft and nontender with nl inspiratory excursion in the supine position. No bruits or organomegaly appreciated, bowel sounds nl  MS:  Nl gait/ ext warm without deformities, calf tenderness, cyanosis or clubbing No obvious joint restrictions   SKIN: warm and dry without lesions    NEURO:  alert, approp, nl sensorium with  no motor or cerebellar deficits apparent.      Assessment & Plan:

## 2018-05-03 ENCOUNTER — Encounter: Payer: Self-pay | Admitting: Internal Medicine

## 2018-05-03 NOTE — Assessment & Plan Note (Addendum)
Spirometry 05/02/2018  FEV1 1.06 (52%)  Ratio 55 with mild curvature p trelegy in am but severe symptoms ? Related to upper airway p ET/ neck surgery  - 05/02/2018  After extensive coaching inhaler device  effectiveness =    75% with symb try 80 2bid sample    She is way over using her saba post op with no wheeze on exam and fev1 is actually up from baseline per records review so I strongly suspect there is pseudoasthma component due to et irritation / post op neck swelling or gerd or effects of trelegy dpi so rec   Change to symbicort 80 2bid Prednisone 10 mg take  4 each am x 2 days,   2 each am x 2 days,  1 each am x 2 days and stop Max rx gerd F/u Dr Lamonte Sakai    I had an extended discussion with the patient reviewing all relevant studies completed to date and  lasting 25 minutes of a 40  minute post hosp f/u offic visit  With pt new to me   re  severe non-specific but potentially very serious refractory respiratory symptoms of uncertain and potentially multiple  etiologies.  See device teaching which extended face to face time for this visit   Each maintenance medication was reviewed in detail including most importantly the difference between maintenance and prns and under what circumstances the prns are to be triggered using an action plan format that is not reflected in the computer generated alphabetically organized AVS.    Please see AVS for specific instructions unique to this office visit that I personally wrote and verbalized to the the pt in detail and then reviewed with pt  by my nurse highlighting any changes in therapy/plan of care  recommended at today's visit.

## 2018-05-05 DIAGNOSIS — Z4789 Encounter for other orthopedic aftercare: Secondary | ICD-10-CM | POA: Diagnosis not present

## 2018-05-05 DIAGNOSIS — J441 Chronic obstructive pulmonary disease with (acute) exacerbation: Secondary | ICD-10-CM | POA: Diagnosis not present

## 2018-05-06 DIAGNOSIS — Z4789 Encounter for other orthopedic aftercare: Secondary | ICD-10-CM | POA: Diagnosis not present

## 2018-05-06 DIAGNOSIS — I10 Essential (primary) hypertension: Secondary | ICD-10-CM | POA: Diagnosis not present

## 2018-05-06 DIAGNOSIS — M5020 Other cervical disc displacement, unspecified cervical region: Secondary | ICD-10-CM | POA: Diagnosis not present

## 2018-05-06 DIAGNOSIS — Z6830 Body mass index (BMI) 30.0-30.9, adult: Secondary | ICD-10-CM | POA: Diagnosis not present

## 2018-05-06 DIAGNOSIS — M5412 Radiculopathy, cervical region: Secondary | ICD-10-CM | POA: Diagnosis not present

## 2018-05-06 DIAGNOSIS — J441 Chronic obstructive pulmonary disease with (acute) exacerbation: Secondary | ICD-10-CM | POA: Diagnosis not present

## 2018-05-09 ENCOUNTER — Inpatient Hospital Stay: Payer: Self-pay | Admitting: Adult Health

## 2018-05-15 DIAGNOSIS — J441 Chronic obstructive pulmonary disease with (acute) exacerbation: Secondary | ICD-10-CM | POA: Diagnosis not present

## 2018-05-15 DIAGNOSIS — Z4789 Encounter for other orthopedic aftercare: Secondary | ICD-10-CM | POA: Diagnosis not present

## 2018-05-16 ENCOUNTER — Ambulatory Visit (INDEPENDENT_AMBULATORY_CARE_PROVIDER_SITE_OTHER): Payer: Medicare Other | Admitting: Pulmonary Disease

## 2018-05-16 ENCOUNTER — Encounter: Payer: Self-pay | Admitting: Pulmonary Disease

## 2018-05-16 DIAGNOSIS — L299 Pruritus, unspecified: Secondary | ICD-10-CM | POA: Diagnosis not present

## 2018-05-16 DIAGNOSIS — J301 Allergic rhinitis due to pollen: Secondary | ICD-10-CM | POA: Diagnosis not present

## 2018-05-16 DIAGNOSIS — F17200 Nicotine dependence, unspecified, uncomplicated: Secondary | ICD-10-CM | POA: Diagnosis not present

## 2018-05-16 DIAGNOSIS — J45901 Unspecified asthma with (acute) exacerbation: Secondary | ICD-10-CM

## 2018-05-16 MED ORDER — BUDESONIDE-FORMOTEROL FUMARATE 80-4.5 MCG/ACT IN AERO: 2.0000 | INHALATION_SPRAY | Freq: Two times a day (BID) | RESPIRATORY_TRACT | 0 refills | Status: DC

## 2018-05-16 NOTE — Patient Instructions (Addendum)
Symbicort 80 provided today  1 800 QUIT NOW  >>> Patient to call this resource and utilize it to help support her quit smoking >>> Keep up your hard work with your quit date officially being 05/09/2018  You can also contact the [facility] >>>For smoking cessation classes call [phone]  Patient to get home and either send a MyChart message or call the office letting us know what nicotine patch they are using we can place a prescription for it.  Patient aware that insurance may not cover this.  Eye drops  >>> Can use refresh eyedrops over-the-counter to help with eye dryness  Benedryl at night for itching  >>> Can take Zyrtec in the morning, and then Benadryl at night to see if this helps resolve your itching >>> If this does not improve her symptoms in the first 3 days of doing this and follow-up with primary care regarding this  Moisturize  >>> Remember to moisturize her skin daily to prevent dryness   Follow-up with Dr. Melvyn Novas in 3 months   Please contact the office if your symptoms worsen or you have concerns that you are not improving.   Thank you for choosing [facility] for your healthcare, and for allowing [place] to partner with you on your healthcare journey. I am thankful to be able to provide care to you today.   [name] FNP-C

## 2018-05-16 NOTE — Assessment & Plan Note (Signed)
  Benedryl at night for itching  >>> Can take Zyrtec in the morning, and then Benadryl at night to see if this helps resolve your itching >>> If this does not improve her symptoms in the first 3 days of doing this and follow-up with primary care regarding this  Moisturize  >>> Remember to moisturize her skin daily to prevent dryness

## 2018-05-16 NOTE — Addendum Note (Signed)
Addended by: Amado Coe on: 05/16/2018 10:48 AM   Modules accepted: Orders

## 2018-05-16 NOTE — Assessment & Plan Note (Signed)
Continue to not smoke  He can utilize these resources listed below to help support you with that: 1 800 QUIT NOW  >>> Patient to call this resource and utilize it to help support her quit smoking >>> Keep up your hard work with your quit date officially being 05/09/2018  You can also contact the [facility] >>>For smoking cessation classes call [phone]  Patient to get home and either send a MyChart message or call the office letting us know what nicotine patch they are using we can place a prescription for it.  Patient aware that insurance may not cover this.  Follow-up with Dr. Melvyn Novas in 3 months

## 2018-05-16 NOTE — Progress Notes (Signed)
Chart and office note reviewed in detail  > agree with a/p as outlined    

## 2018-05-16 NOTE — Assessment & Plan Note (Signed)
Continue Symbicort Symbicort 80 samples provided today  Follow-up with Dr. Melvyn Novas in 3 months  Continue to not smoke

## 2018-05-16 NOTE — Progress Notes (Signed)
@Patient  ID: [name], female    DOB: 29-Mar-1961, 57 y.o.   MRN: [id number]  Chief Complaint  Patient presents with  . Follow-up    Doing well no SOB , no cough at this time.But eyes are icthing and skin icthing as well.    Referring provider: Biagio Borg, MD  HPI:  57 year old female patient followed for asthma, COPD, allergic rhinitis  Former smoker- quit date 05/09/2018- actively working to quit smoking, using nicotine patches, as well as the reduced to quit method.    Recent [facility] Encounters:  Acute OV 11/16/16 -- patient has a history of severe obstructive lung disease with continued tobacco use, bipolar disorder, allergic rhinitis, frequent exacerbations. She has been managed on Xolair by her allergist. She was last seen here 1 month ago. Currently managed on Spiriva, Symbicort. We restarted Protonix, Flonase, Astelin nasal spray last visit as frequently exacerbating. She continues to smoke approximately 2 a day. She has noticed a L posterior flank and back pain 5 days ago. Has had continued to have cough although reports that congestion is better than last time. It is worst with twisting or laying flat. Bad when she coughs. Does not hurt when she is still. Breathing is better than last time - less nasal congestion.    ROV 02/11/18 --57 year old woman, active smoker (rare cigarettes now) with bipolar disorder, allergic rhinitis, COPD/asthma diagnosed around 2000.  Spirometry 05/2009 showed severe obstruction (34% predicted FEV1).  She has been managed with Xolair by her allergist, getting once a month.  I had not seen her since 11/16/16.  She was seen here by Dr. Vaughan Browner on 1/29 with symptoms consistent with an acute exacerbation. Finished abx and pred taper with good improvement. PCP changed her to Trelegy 1 month ago - she prefers it, believes that she has benefited from this.  She is starting to have a lot of nasal congestion and mucous especially in the am,  associated with cough. She is on flonase and zyrtec, singulair. Has astelin NS available. Not on xyzal. She is using albuterol about 1x a day.    rec We will continue Trelegy 1 inhalation daily. Congratulations on decreasing your smoking!  Please continue to work hard on stopping altogether. Continue to follow with your allergist and to receive your Xolair treatments once a month. Continue fluticasone nasal spray, Zyrtec, Singulair, Astelin nasal spray as you have been using them. Keep albuterol available to use 2 puffs up to every 4 hours if needed for shortness of breath, wheezing, chest tightness. Continue Protonix 40 mg daily Neck surgery 04/14/18 then sob same night > ER >  Admit with dx copd exac and d/c on 04/17/18    05/16/18 OV  Patient seen in office today for follow-up.  Patient doing much better since last appointment.  Patient has finished prednisone.  Patient is adherent to her Symbicort 80 inhaler.  Patient does report that sometimes is difficult to afford the inhaler as well as her other medications.  Is asked for sample today.  Patient also admits that in the last month she has smoked 2 cigarettes.  Her last cigarette being on 05/09/2018.  Patient is interested in additional resources to help and quit smoking.  Patient has been utilizing nicotine patches as well is interested in a prescription to help with the cost of these.  Patient was at a cookout this past weekend and since then has had some itching in her ankles as well as her upper arms.  Also since then she has had some additional eye dryness.   Allergies  Allergen Reactions  . Sulfa Antibiotics Nausea And Vomiting  . Sulfonamide Derivatives Nausea And Vomiting    REACTION: n/v    Immunization History  Administered Date(s) Administered  . Influenza Split 10/23/2011  . Influenza Whole 09/16/2012  . Influenza,inj,Quad PF,6+ Mos 10/18/2014, 08/31/2015, 09/25/2016, 10/09/2017  . Pneumococcal Polysaccharide-23 12/02/2015    . Td 04/22/2014    Past Medical History:  Diagnosis Date  . ALLERGIC RHINITIS 05/12/2009   Qualifier: Diagnosis of  By: Lenn Cal Deborra Medina), Wynona Canes    . Anemia, iron deficiency 12/22/2014  . Angina   . Anxiety   . Arthritis   . Asthma   . ASTHMA 05/12/2009   Severe AFL (Spirometry 05/2009: pre-BD FEV1 0.87L 34% pred, post-BD FEV1 1.11L 44% pred) Volumes hyperinflated Decreased DLCO that does not fully correct to normal range for alveolar volume.     Marland Kitchen COPD 08/24/2009   Qualifier: Diagnosis of  By: Burnett Kanaris    . COPD (chronic obstructive pulmonary disease) ([place])   . Depression   . Fibromyalgia 05/14/2014  . GERD (gastroesophageal reflux disease)   . Hyperlipidemia 04/20/2017  . HYPERTENSION 05/12/2009   Qualifier: Diagnosis of  By: Lenn Cal Deborra Medina), Wynona Canes    . Migraine   . Nephrolithiasis   . Peripheral vascular disease ([place])   . Prediabetes 02/23/2014  . Seizure ([place])   . Seizure ([place])   . Urticaria     Tobacco History: Social History   Tobacco Use  Smoking Status Former Smoker  . Packs/day: 0.50  . Years: 36.00  . Pack years: 18.00  . Types: Cigarettes  . Last attempt to quit: 05/09/2018  . Years since quitting: 0.0  Smokeless Tobacco Never Used  Tobacco Comment   Passive smoker.  Patient states her quit date was 05/09/2018.  Patient educated with resources at today's appointment to continue to support her to stop smoking.   Counseling given: Yes Comment: Passive smoker.  Patient states her quit date was 05/09/2018.  Patient educated with resources at today's appointment to continue to support her to stop smoking.  1 [place]  >>> Patient to call this resource and utilize it to help support her quit smoking >>> Keep up your hard work with your quit date officially being 05/09/2018  You can also contact the [facility] >>>For smoking cessation classes call [phone]   Outpatient Encounter Medications as of 05/16/2018  Medication Sig  .  albuterol (PROVENTIL HFA;VENTOLIN HFA) 108 (90 Base) MCG/ACT inhaler INHALE 1 TO 2 PUFFS BY MOUTH EVERY 4 TO 6 HOURS AS NEEDED (Patient taking differently: INHALE 1 TO 2 PUFFS BY MOUTH EVERY 4 TO 6 HOURS AS NEEDED FOR SHORTNESS OF BREATH)  . albuterol (PROVENTIL) (2.5 MG/3ML) 0.083% nebulizer solution Take 2.5 mg by nebulization every 4 (four) hours as needed for wheezing or shortness of breath. Reported on 06/04/2016  . budesonide-formoterol (SYMBICORT) 80-4.5 MCG/ACT inhaler Inhale 2 puffs into the lungs 2 (two) times daily.  . cetirizine (ZYRTEC) 10 MG tablet TAKE 1 TABLET(10 MG) BY MOUTH DAILY  . estradiol (CLIMARA - DOSED IN MG/24 HR) 0.05 mg/24hr patch Place 1 patch (0.05 mg total) onto the skin once a week.  . fluticasone (FLONASE) 50 MCG/ACT nasal spray Place 2 sprays into both nostrils daily. (Patient taking differently: Place 2 sprays into both nostrils daily as needed for allergies. )  . Omalizumab (XOLAIR [place]) Inject 1 Applicatorful into the skin every  30 (thirty) days.  . pantoprazole (PROTONIX) 40 MG tablet Take 30-60 min before first meal of the day  . pravastatin (PRAVACHOL) 40 MG tablet Take 1 tablet (40 mg total) by mouth daily.  . ranitidine (ZANTAC) 150 MG tablet Take 1 tablet (150 mg total) by mouth 2 (two) times daily as needed for heartburn.  Marland Kitchen tiZANidine (ZANAFLEX) 4 MG tablet TAKE 1 TABLET(4 MG) BY MOUTH EVERY 6 HOURS AS NEEDED FOR MUSCLE SPASMS  . hydrochlorothiazide (MICROZIDE) 12.5 MG capsule Take 1 capsule (12.5 mg total) by mouth daily.  . [DISCONTINUED] predniSONE (DELTASONE) 10 MG tablet Take  4 each am x 2 days,   2 each am x 2 days,  1 each am x 2 days and stop   Facility-Administered Encounter Medications as of 05/16/2018  Medication  . omalizumab Arvid Right) injection 300 mg     Review of Systems  Constitutional:   No  weight loss, night sweats,  fevers, chills, fatigue, or  lassitude HEENT: +dry eyes  No headaches,  Difficulty swallowing,  Tooth/dental problems,  or  Sore throat, No sneezing, itching, ear ache, nasal congestion, post nasal drip  CV: No chest pain,  orthopnea, PND, swelling in lower extremities, anasarca, dizziness, palpitations, syncope  GI: No heartburn, indigestion, abdominal pain, nausea, vomiting, diarrhea, change in bowel habits, loss of appetite, bloody stools Resp: No shortness of breath with exertion or at rest.  No excess mucus, no productive cough,  No non-productive cough,  No coughing up of blood.  No change in color of mucus.  No wheezing.  No chest wall deformity Skin: +itchy ankles  no rash, lesions, no skin changes. GU: no dysuria, change in color of urine, no urgency or frequency.  No flank pain, no hematuria  MS:  No joint pain or swelling.  No decreased range of motion.  No back pain. Psych:  No change in mood or affect. No depression or anxiety.  No memory loss.   Physical Exam  BP 110/78 (BP Location: Left Arm, Cuff Size: Normal)   Pulse 87   Ht 5\' 3"  (1.6 m)   Wt 171 lb 9.6 oz (77.8 kg)   SpO2 98%   BMI 30.40 kg/m   GEN: A/Ox3; pleasant , NAD, well nourished    HEENT:  [place]/AT,  EACs-clear, TMs-wnl, NOSE-clear, THROAT- slight post nasal drip, mallampati III, no lesions  NECK:  Supple w/ fair ROM; no JVD; no thyromegaly or nodules palpated; no lymphadenopathy.    RESP:  Clear  P & A; w/o, wheezes/ rales/ or rhonchi. no accessory muscle use, no dullness to percussion  CARD:  RRR, no m/r/g, no peripheral edema, pulses intact, no cyanosis or clubbing.  GI:   Soft & nt; nml bowel sounds; no organomegaly or masses detected.   Musco: Warm bil, no deformities or joint swelling noted.   Neuro: alert, no focal deficits noted.    Skin: Warm, no lesions or rashes, dry skin around ankles     Lab Results:  CBC    Component Value Date/Time   WBC 25.9 (H) 04/17/2018 0343   RBC 4.07 04/17/2018 0343   HGB 12.0 04/17/2018 0343   HCT 36.8 04/17/2018 0343   PLT 356 04/17/2018 0343   MCV 90.4 04/17/2018 0343    MCH 29.5 04/17/2018 0343   MCHC 32.6 04/17/2018 0343   RDW 13.6 04/17/2018 0343   LYMPHSABS 1.8 04/17/2018 0343   MONOABS 0.5 04/17/2018 0343   EOSABS 0.0 04/17/2018 0343   BASOSABS 0.0 04/17/2018  0343    BMET    Component Value Date/Time   NA 138 04/17/2018 0343   K 4.5 04/17/2018 0343   CL 102 04/17/2018 0343   CO2 28 04/17/2018 0343   GLUCOSE 149 (H) 04/17/2018 0343   BUN 19 04/17/2018 0343   CREATININE 0.75 04/17/2018 0343   CREATININE 0.71 02/23/2014 1054   CALCIUM 9.6 04/17/2018 0343   GFRNONAA >60 04/17/2018 0343   GFRNONAA >89 02/23/2014 1054   GFRAA >60 04/17/2018 0343   GFRAA >89 02/23/2014 1054    BNP No results found for: BNP  ProBNP    Component Value Date/Time   PROBNP 15.0 04/19/2017 1636    Imaging: No results found.   Assessment & Plan:   Pleasant 57 year old patient.  Presenting today for follow-up appointment.  Patient doing well and assessment is benign.  Emphasized with patient today the importance of continuing to stop smoking.  Provided smoking cessation resources to the patient today.  Patient follow-up with our office if she has questions or concerns about these resources.  Patient to keep close follow-up with primary care.  Patient is reporting minimal of itchy skin primarily around the ankles.  No rash seen on exam today.  Patient can do a small trial of Benadryl at night in addition to her Zyrtec in the morning.  Patient can also utilize refresh over-the-counter eyedrops for dry eye symptoms.  If this does not help with her symptoms she needs to follow-up with primary care regarding the symptoms.   Symbicort 80 sample provided today in office.  Follow-up with Dr. Melvyn Novas 3 months.    Allergic rhinitis Continue medication regimen as prescribed Can add Benadryl at night with Zyrtec in the morning for a few days to see if that helps with skin itching around the ankles.  Follow-up with Dr. Melvyn Novas in 3 months  Asthma Continue  Symbicort Symbicort 101 samples provided today  Follow-up with Dr. Melvyn Novas in 3 months  Continue to not smoke   History of tobacco abuse Continue to not smoke  He can utilize these resources listed below to help support you with that: 1 800 QUIT NOW  >>> Patient to call this resource and utilize it to help support her quit smoking >>> Keep up your hard work with your quit date officially being 05/09/2018  You can also contact the [facility] >>>For smoking cessation classes call [phone]  Patient to get home and either send a MyChart message or call the office letting us know what nicotine patch they are using we can place a prescription for it.  Patient aware that insurance may not cover this.  Follow-up with Dr. Melvyn Novas in 3 months  Itching  Benedryl at night for itching  >>> Can take Zyrtec in the morning, and then Benadryl at night to see if this helps resolve your itching >>> If this does not improve her symptoms in the first 3 days of doing this and follow-up with primary care regarding this  Moisturize  >>> Remember to moisturize her skin daily to prevent dryness    This appointment was 26 minutes along with over 50% of that time being being direct face-to-face patient care, assessment, patient education, smoking cessation education.   Lauraine Rinne, NP 05/16/2018

## 2018-05-16 NOTE — Assessment & Plan Note (Signed)
Continue medication regimen as prescribed Can add Benadryl at night with Zyrtec in the morning for a few days to see if that helps with skin itching around the ankles.  Follow-up with Dr. Melvyn Novas in 3 months

## 2018-05-19 DIAGNOSIS — Z4789 Encounter for other orthopedic aftercare: Secondary | ICD-10-CM | POA: Diagnosis not present

## 2018-05-19 DIAGNOSIS — J441 Chronic obstructive pulmonary disease with (acute) exacerbation: Secondary | ICD-10-CM | POA: Diagnosis not present

## 2018-05-20 ENCOUNTER — Ambulatory Visit (INDEPENDENT_AMBULATORY_CARE_PROVIDER_SITE_OTHER): Payer: Medicare Other | Admitting: *Deleted

## 2018-05-20 DIAGNOSIS — J4540 Moderate persistent asthma, uncomplicated: Secondary | ICD-10-CM

## 2018-05-27 ENCOUNTER — Ambulatory Visit: Payer: Medicare Other | Admitting: Internal Medicine

## 2018-05-30 NOTE — Discharge Summary (Signed)
Physician Discharge Summary  SUMI LYE DJM:[id number] DOB: 1961/02/26 DOA: 04/14/2018  PCP: Biagio Borg, MD  Admit date: 04/14/2018 Discharge date: 04/17/2018  Admitted From: Home Discharge disposition: Home   Recommendations for Outpatient Follow-Up:   1. F/U CBC to evaluate for persistent leukocytosis.   Discharge Diagnosis:   Principal Problem:   COPD with acute exacerbation ([place]) Active Problems:   HTN (hypertension)   Hyperlipidemia   Cervical radiculopathy   Leukocytosis   Seizure (HCC)   Nausea & vomiting   Discharge Condition: Improved.  Diet recommendation: Low sodium, heart healthy.    Wound care: Per Dr. Arnoldo Morale.  Code status: Full.   History of Present Illness:   Felicia Winfield Carringtonis a 57 y.o.femalewith medical history significant ofCOPD, asthma, hypertension, hyperlipidemia, GERD, depression with anxiety, PVD, seizure, who presents with shortness of breath.  Pt states that she has hx of cervical radiculopathy. She underwentoutpatient cervical spine fusionprocedure byDr. Jenkinswithneurosurgery 04/14/18.She was intubatedduring the procedure. She states that she felt SOB since went home. She also has chest tightness. She has mild cough with pink colored mucus production. No fever or chill. She also reports nausea and vomited several times. Patient denies diarrhea or abdominal pain. Shedenies any bleeding from surgical site. No numbness or tingling down her arms. No symptoms of UTI or unilateral weakness.  Hospital Course by Problem:   Principal Problem:   COPD with acute exacerbation (HCC) Treated with inhaled and IV steroids, doxycycline, Mucinex and pulmonary toilet.  Active Problems:   HTN (hypertension) Continue home medications.    Hyperlipidemia Continue pravastatin.    Cervical radiculopathy Status post C6-7 anterior cervical discectomy, fusion and plating on 04/14/2018.      Leukocytosis Thought to be  steroid-induced.    History of seizure disorder No longer taking Keppra and Depakote.  No seizure events.    Nausea & vomiting Resolved.  Medical Consultants:    Neurosurgery   Discharge Exam:   Vitals:   04/17/18 1323 04/17/18 1449  BP: (!) 146/103 (!) 140/95  Pulse: (!) 108 93  Resp: 16 17  Temp: 98.2 F (36.8 C)   SpO2: 97% 96%   Vitals:   04/17/18 0841 04/17/18 0844 04/17/18 1323 04/17/18 1449  BP:   (!) 146/103 (!) 140/95  Pulse:   (!) 108 93  Resp:   16 17  Temp:   98.2 F (36.8 C)   TempSrc:      SpO2: 99% 99% 97% 96%  Weight:      Height:        Done by Dr. Aggie Moats who actually discharged the patient.   The results of significant diagnostics from this hospitalization (including imaging, microbiology, ancillary and laboratory) are listed below for reference.     Procedures and Diagnostic Studies:   Dg Chest 2 View  Result Date: 04/14/2018 CLINICAL DATA:  57 y/o  F; nausea and vomiting. EXAM: CHEST - 2 VIEW COMPARISON:  01/14/2018 chest radiograph FINDINGS: Stable normal cardiac silhouette. Aortic atherosclerosis with calcification. Clear lungs. No pleural effusion or pneumothorax. No acute osseous abnormality is evident. IMPRESSION: No acute pulmonary process identified. Electronically Signed   By: Kristine Garbe M.D.   On: 04/14/2018 21:06     Labs:   WBC 25.9, otherwise unremarkable.  Discharge Instructions:   Discharge Instructions    Call MD for:  difficulty breathing, headache or visual disturbances   Complete by:  As directed    Call MD for:  difficulty breathing, headache or visual disturbances  Complete by:  As directed    Call MD for:  extreme fatigue   Complete by:  As directed    Call MD for:  persistant dizziness or light-headedness   Complete by:  As directed    Call MD for:  persistant dizziness or light-headedness   Complete by:  As directed    Call MD for:  persistant nausea and vomiting   Complete by:  As directed     Call MD for:  persistant nausea and vomiting   Complete by:  As directed    Call MD for:  severe uncontrolled pain   Complete by:  As directed    Call MD for:  temperature >100.4   Complete by:  As directed    Call MD for:  temperature >100.4   Complete by:  As directed    Diet - low sodium heart healthy   Complete by:  As directed    Diet - low sodium heart healthy   Complete by:  As directed    Discharge instructions   Complete by:  As directed    F/U with PCP Monday or Tuesday   Discharge instructions   Complete by:  As directed    PCP f/u on Monday   Increase activity slowly   Complete by:  As directed    Increase activity slowly   Complete by:  As directed      Allergies as of 04/17/2018      Reactions   Sulfa Antibiotics Nausea And Vomiting   Sulfonamide Derivatives Nausea And Vomiting   REACTION: n/v      Medication List    STOP taking these medications   divalproex 500 MG 24 hr tablet Commonly known as:  DEPAKOTE ER   levETIRAcetam 500 MG tablet Commonly known as:  KEPPRA   levocetirizine 5 MG tablet Commonly known as:  XYZAL     TAKE these medications   albuterol (2.5 MG/3ML) 0.083% nebulizer solution Commonly known as:  PROVENTIL Take 2.5 mg by nebulization every 4 (four) hours as needed for wheezing or shortness of breath. Reported on 06/04/2016 What changed:  Another medication with the same name was changed. Make sure you understand how and when to take each.   albuterol 108 (90 Base) MCG/ACT inhaler Commonly known as:  PROVENTIL HFA;VENTOLIN HFA INHALE 1 TO 2 PUFFS BY MOUTH EVERY 4 TO 6 HOURS AS NEEDED What changed:  See the new instructions.   estradiol 0.05 mg/24hr patch Commonly known as:  CLIMARA - Dosed in mg/24 hr Place 1 patch (0.05 mg total) onto the skin once a week.   fluticasone 50 MCG/ACT nasal spray Commonly known as:  FLONASE Place 2 sprays into both nostrils daily. What changed:    when to take this  reasons to take this    hydrochlorothiazide 12.5 MG capsule Commonly known as:  MICROZIDE Take 1 capsule (12.5 mg total) by mouth daily.   pravastatin 40 MG tablet Commonly known as:  PRAVACHOL Take 1 tablet (40 mg total) by mouth daily.   ranitidine 150 MG tablet Commonly known as:  ZANTAC Take 1 tablet (150 mg total) by mouth 2 (two) times daily as needed for heartburn.   tiZANidine 4 MG tablet Commonly known as:  ZANAFLEX TAKE 1 TABLET(4 MG) BY MOUTH EVERY 6 HOURS AS NEEDED FOR MUSCLE SPASMS   XOLAIR [place] Inject 1 Applicatorful into the skin every 30 (thirty) days.     ASK your doctor about these medications   doxycycline  100 MG tablet Commonly known as:  VIBRA-TABS Take 1 tablet (100 mg total) by mouth every 12 (twelve) hours for 9 days. Ask about: Should I take this medication?   guaiFENesin 600 MG 12 hr tablet Commonly known as:  MUCINEX Take 2 tablets (1,200 mg total) by mouth 2 (two) times daily for 5 days. Ask about: Should I take this medication?   predniSONE 10 MG tablet Commonly known as:  DELTASONE Take 4 tablets (40 mg total) by mouth daily for 4 days. Ask about: Should I take this medication?         Time coordinating discharge: 20 minutes  Signed:  Jacquelynn Cree  Pager [phone] [facility] 05/30/2018, 1:15 PM

## 2018-06-13 ENCOUNTER — Other Ambulatory Visit: Payer: Self-pay | Admitting: Allergy and Immunology

## 2018-06-17 ENCOUNTER — Ambulatory Visit (INDEPENDENT_AMBULATORY_CARE_PROVIDER_SITE_OTHER): Payer: Medicare Other | Admitting: *Deleted

## 2018-06-17 DIAGNOSIS — J4550 Severe persistent asthma, uncomplicated: Secondary | ICD-10-CM

## 2018-06-23 ENCOUNTER — Other Ambulatory Visit (INDEPENDENT_AMBULATORY_CARE_PROVIDER_SITE_OTHER): Payer: Medicare Other

## 2018-06-23 ENCOUNTER — Ambulatory Visit (INDEPENDENT_AMBULATORY_CARE_PROVIDER_SITE_OTHER): Payer: Medicare Other | Admitting: Internal Medicine

## 2018-06-23 ENCOUNTER — Encounter: Payer: Self-pay | Admitting: Internal Medicine

## 2018-06-23 VITALS — BP 120/84 | HR 96 | Temp 98.9°F | Ht 63.0 in | Wt 179.0 lb

## 2018-06-23 DIAGNOSIS — J441 Chronic obstructive pulmonary disease with (acute) exacerbation: Secondary | ICD-10-CM

## 2018-06-23 DIAGNOSIS — I10 Essential (primary) hypertension: Secondary | ICD-10-CM

## 2018-06-23 DIAGNOSIS — R7303 Prediabetes: Secondary | ICD-10-CM

## 2018-06-23 DIAGNOSIS — E785 Hyperlipidemia, unspecified: Secondary | ICD-10-CM

## 2018-06-23 DIAGNOSIS — D72829 Elevated white blood cell count, unspecified: Secondary | ICD-10-CM

## 2018-06-23 LAB — HEPATIC FUNCTION PANEL
ALT: 14 U/L (ref 0–35)
AST: 14 U/L (ref 0–37)
Albumin: 4.2 g/dL (ref 3.5–5.2)
Alkaline Phosphatase: 78 U/L (ref 39–117)
Bilirubin, Direct: 0 mg/dL (ref 0.0–0.3)
Total Bilirubin: 0.3 mg/dL (ref 0.2–1.2)
Total Protein: 6.9 g/dL (ref 6.0–8.3)

## 2018-06-23 LAB — URINALYSIS, ROUTINE W REFLEX MICROSCOPIC
Bilirubin Urine: NEGATIVE
Hgb urine dipstick: NEGATIVE
Ketones, ur: NEGATIVE
Leukocytes, UA: NEGATIVE
Nitrite: NEGATIVE
RBC / HPF: NONE SEEN (ref 0–?)
Specific Gravity, Urine: 1.015 (ref 1.000–1.030)
Total Protein, Urine: NEGATIVE
Urine Glucose: NEGATIVE
Urobilinogen, UA: 0.2 (ref 0.0–1.0)
pH: 6 (ref 5.0–8.0)

## 2018-06-23 LAB — CBC WITH DIFFERENTIAL/PLATELET
Basophils Absolute: 0 10*3/uL (ref 0.0–0.1)
Basophils Relative: 0.4 % (ref 0.0–3.0)
Eosinophils Absolute: 0.2 10*3/uL (ref 0.0–0.7)
Eosinophils Relative: 1.7 % (ref 0.0–5.0)
HCT: 35.1 % — ABNORMAL LOW (ref 36.0–46.0)
Hemoglobin: 11.8 g/dL — ABNORMAL LOW (ref 12.0–15.0)
Lymphocytes Relative: 25.4 % (ref 12.0–46.0)
Lymphs Abs: 2.6 10*3/uL (ref 0.7–4.0)
MCHC: 33.5 g/dL (ref 30.0–36.0)
MCV: 90.8 fl (ref 78.0–100.0)
Monocytes Absolute: 0.8 10*3/uL (ref 0.1–1.0)
Monocytes Relative: 7.2 % (ref 3.0–12.0)
Neutro Abs: 6.8 10*3/uL (ref 1.4–7.7)
Neutrophils Relative %: 65.3 % (ref 43.0–77.0)
Platelets: 373 10*3/uL (ref 150.0–400.0)
RBC: 3.87 Mil/uL (ref 3.87–5.11)
RDW: 14.8 % (ref 11.5–15.5)
WBC: 10.4 10*3/uL (ref 4.0–10.5)

## 2018-06-23 LAB — TSH: TSH: 0.77 u[IU]/mL (ref 0.35–4.50)

## 2018-06-23 LAB — BASIC METABOLIC PANEL
BUN: 11 mg/dL (ref 6–23)
CO2: 32 mEq/L (ref 19–32)
Calcium: 9.4 mg/dL (ref 8.4–10.5)
Chloride: 102 mEq/L (ref 96–112)
Creatinine, Ser: 0.67 mg/dL (ref 0.40–1.20)
GFR: 117.3 mL/min (ref >60.00)
Glucose, Bld: 89 mg/dL (ref 70–99)
Potassium: 3.8 mEq/L (ref 3.5–5.1)
Sodium: 142 mEq/L (ref 135–145)

## 2018-06-23 LAB — LIPID PANEL
Cholesterol: 139 mg/dL (ref 0–200)
HDL: 63 mg/dL (ref >39.00)
LDL Cholesterol: 65 mg/dL (ref 0–99)
NonHDL: 75.7
Total CHOL/HDL Ratio: 2
Triglycerides: 52 mg/dL (ref 0.0–149.0)
VLDL: 10.4 mg/dL (ref 0.0–40.0)

## 2018-06-23 LAB — HEMOGLOBIN A1C: Hgb A1c MFr Bld: 6.2 % (ref 4.6–6.5)

## 2018-06-23 NOTE — Assessment & Plan Note (Signed)
Goal ldl < 100,  For f/u lab, o/w stable overall by history and exam, recent data reviewed with pt, and pt to continue medical treatment as before,  to f/u any worsening symptoms or concerns]

## 2018-06-23 NOTE — Assessment & Plan Note (Signed)
stable overall by history and exam, and pt to continue medical treatment as before,  to f/u any worsening symptoms or concerns 

## 2018-06-23 NOTE — Progress Notes (Signed)
Subjective:    Patient ID: [name], female    DOB: May 03, 1961, 57 y.o.   MRN: [id number]  HPI  Here for yearly f/u;  Overall doing ok;  Pt denies Chest pain, worsening SOB, DOE, wheezing, orthopnea, PND, worsening LE edema, palpitations, dizziness or syncope.  Pt denies neurological change such as new headache, facial or extremity weakness.  Pt denies polydipsia, polyuria, or low sugar symptoms. Pt states overall good compliance with treatment and medications, good tolerability, and has been trying to follow appropriate diet.  Pt denies worsening depressive symptoms, suicidal ideation or panic. No fever, night sweats, wt loss, loss of appetite, or other constitutional symptoms.  Pt states good ability with ADL's, has low fall risk, home safety reviewed and adequate, no other significant changes in hearing or vision, and only occasionally active with exercise. Has f/u with psychiatry soon, plans to call for optho soon, having some blurry vision recently, has to wear readers. .   Past Medical History:  Diagnosis Date  . ALLERGIC RHINITIS 05/12/2009   Qualifier: Diagnosis of  By: Lenn Cal Deborra Medina), Wynona Canes    . Anemia, iron deficiency 12/22/2014  . Angina   . Anxiety   . Arthritis   . Asthma   . ASTHMA 05/12/2009   Severe AFL (Spirometry 05/2009: pre-BD FEV1 0.87L 34% pred, post-BD FEV1 1.11L 44% pred) Volumes hyperinflated Decreased DLCO that does not fully correct to normal range for alveolar volume.     Marland Kitchen COPD 08/24/2009   Qualifier: Diagnosis of  By: Burnett Kanaris    . COPD (chronic obstructive pulmonary disease) ([place])   . Depression   . Fibromyalgia 05/14/2014  . GERD (gastroesophageal reflux disease)   . Hyperlipidemia 04/20/2017  . HYPERTENSION 05/12/2009   Qualifier: Diagnosis of  By: Lenn Cal Deborra Medina), Wynona Canes    . Migraine   . Nephrolithiasis   . Peripheral vascular disease ([place])   . Prediabetes 02/23/2014  . Seizure ([place])   . Seizure ([place])   . Urticaria    Past Surgical  History:  Procedure Laterality Date  . ABDOMINAL HYSTERECTOMY    . BACK SURGERY    . COLONOSCOPY  12/20/2011   Procedure: COLONOSCOPY;  Surgeon: Landry Dyke, MD;  Location: [facility];  Service: Endoscopy;  Laterality: N/A;  . COLONOSCOPY  03/05/2012   Procedure: COLONOSCOPY;  Surgeon: Landry Dyke, MD;  Location: [facility];  Service: Endoscopy;  Laterality: N/A;  . DIAGNOSTIC LAPAROSCOPY    . HEMORRHOID SURGERY    . INCISE AND DRAIN ABCESS    . KIDNEY STONE SURGERY    . NECK SURGERY    . TOE SURGERY    . TUBAL LIGATION      reports that she quit smoking about 6 weeks ago. Her smoking use included cigarettes. She has a 18.00 pack-year smoking history. She has never used smokeless tobacco. She reports that she drinks alcohol. She reports that she has current or past drug history. Drug: Marijuana. family history includes Alcohol abuse in her brother and father; Anesthesia problems in her father; Asthma in her brother, mother, and sister; Brain cancer in her sister; COPD in her mother; Cancer in her father; Diabetes in her brother, father, mother, sister, and sister; Heart disease in her brother, mother, and sister; Hypertension in her sister; Kidney disease in her father; Sleep apnea in her brother. Allergies  Allergen Reactions  . Sulfa Antibiotics Nausea And Vomiting  . Sulfonamide Derivatives Nausea And Vomiting    REACTION: n/v   Current  Outpatient Medications on File Prior to Visit  Medication Sig Dispense Refill  . albuterol (PROVENTIL HFA;VENTOLIN HFA) 108 (90 Base) MCG/ACT inhaler INHALE 1 TO 2 PUFFS BY MOUTH EVERY 4 TO 6 HOURS AS NEEDED (Patient taking differently: INHALE 1 TO 2 PUFFS BY MOUTH EVERY 4 TO 6 HOURS AS NEEDED FOR SHORTNESS OF BREATH) 18 g 5  . albuterol (PROVENTIL) (2.5 MG/3ML) 0.083% nebulizer solution Take 2.5 mg by nebulization every 4 (four) hours as needed for wheezing or shortness of breath. Reported on 06/04/2016    . budesonide-formoterol (SYMBICORT)  80-4.5 MCG/ACT inhaler Inhale 2 puffs into the lungs 2 (two) times daily. 1 Inhaler 11  . budesonide-formoterol (SYMBICORT) 80-4.5 MCG/ACT inhaler Inhale 2 puffs into the lungs 2 (two) times daily. 1 Inhaler 0  . cetirizine (ZYRTEC) 10 MG tablet TAKE 1 TABLET(10 MG) BY MOUTH DAILY 30 tablet 2  . estradiol (CLIMARA - DOSED IN MG/24 HR) 0.05 mg/24hr patch Place 1 patch (0.05 mg total) onto the skin once a week. 12 patch 4  . fluticasone (FLONASE) 50 MCG/ACT nasal spray Place 2 sprays into both nostrils daily. (Patient taking differently: Place 2 sprays into both nostrils daily as needed for allergies. ) 16 g 5  . levocetirizine (XYZAL) 5 MG tablet TAKE 1 TABLET BY MOUTH EVERY DAY AS NEEDED FOR ALLERGIES 30 tablet 0  . Omalizumab (XOLAIR [place]) Inject 1 Applicatorful into the skin every 30 (thirty) days.    . pantoprazole (PROTONIX) 40 MG tablet Take 30-60 min before first meal of the day 30 tablet 2  . pravastatin (PRAVACHOL) 40 MG tablet Take 1 tablet (40 mg total) by mouth daily. 90 tablet 3  . ranitidine (ZANTAC) 150 MG tablet Take 1 tablet (150 mg total) by mouth 2 (two) times daily as needed for heartburn. 60 tablet 2  . tiZANidine (ZANAFLEX) 4 MG tablet TAKE 1 TABLET(4 MG) BY MOUTH EVERY 6 HOURS AS NEEDED FOR MUSCLE SPASMS 60 tablet 0  . hydrochlorothiazide (MICROZIDE) 12.5 MG capsule Take 1 capsule (12.5 mg total) by mouth daily. 30 capsule 11   Current Facility-Administered Medications on File Prior to Visit  Medication Dose Route Frequency Provider Last Rate Last Dose  . omalizumab Arvid Right) injection 300 mg  300 mg Subcutaneous Q28 days Valentina Shaggy, MD   300 mg at 06/17/18 4259   Review of Systems  Constitutional: Negative for other unusual diaphoresis or sweats HENT: Negative for ear discharge or swelling Eyes: Negative for other worsening visual disturbances Respiratory: Negative for stridor or other swelling  Gastrointestinal: Negative for worsening distension or other  blood Genitourinary: Negative for retention or other urinary change Musculoskeletal: Negative for other MSK pain or swelling Skin: Negative for color change or other new lesions Neurological: Negative for worsening tremors and other numbness  Psychiatric/Behavioral: Negative for worsening agitation or other fatigue All other sysptoms neg per pt    Objective:   Physical Exam BP 120/84   Pulse 96   Temp 98.9 F (37.2 C) (Oral)   Ht 5\' 3"  (1.6 m)   Wt 179 lb (81.2 kg)   SpO2 98%   BMI 31.71 kg/m  VS noted,  Constitutional: Pt is oriented to person, place, and time. Appears well-developed and well-nourished, in no significant distress and comfortable Head: Normocephalic and atraumatic  Eyes: Conjunctivae and EOM are normal. Pupils are equal, round, and reactive to light Right Ear: External ear normal without discharge Left Ear: External ear normal without discharge Nose: Nose without discharge or deformity Mouth/Throat:  Oropharynx is without other ulcerations and moist  Neck: Normal range of motion. Neck supple. No JVD present. No tracheal deviation present or significant neck LA or mass Cardiovascular: Normal rate, regular rhythm, normal heart sounds and intact distal pulses.   Pulmonary/Chest: WOB normal and breath sounds without rales or wheezing  Abdominal: Soft. Bowel sounds are normal. NT. No HSM  Musculoskeletal: Normal range of motion. Exhibits no edema Lymphadenopathy: Has no other cervical adenopathy.  Neurological: Pt is alert and oriented to person, place, and time. Pt has normal reflexes. No cranial nerve deficit. Motor grossly intact, Gait intact Skin: Skin is warm and dry. No rash noted or new ulcerations Psychiatric:  Has nervous depressed mood and affect. Behavior is normal without agitation No other exam findings Lab Results  Component Value Date   WBC 25.9 (H) 04/17/2018   HGB 12.0 04/17/2018   HCT 36.8 04/17/2018   PLT 356 04/17/2018   GLUCOSE 149 (H)  04/17/2018   CHOL 199 04/19/2017   TRIG 69.0 04/19/2017   HDL 59.20 04/19/2017   LDLCALC 126 (H) 04/19/2017   ALT 15 04/19/2017   AST 16 04/19/2017   NA 138 04/17/2018   K 4.5 04/17/2018   CL 102 04/17/2018   CREATININE 0.75 04/17/2018   BUN 19 04/17/2018   CO2 28 04/17/2018   TSH 0.56 10/10/2017   HGBA1C 5.5 12/15/2013         Assessment & Plan:

## 2018-06-23 NOTE — Patient Instructions (Addendum)
Please remember to call for you yearly eye doctor appt  Please continue all other medications as before, and refills have been done if requested.  Please have the pharmacy call with any other refills you may need.  Please continue your efforts at being more active, low cholesterol diet, and weight control.  You are otherwise up to date with prevention measures today.  Please keep your appointments with your specialists as you may have planned  Please go to the LAB in the Basement (turn left off the elevator) for the tests to be done today  You will be contacted by phone if any changes need to be made immediately.  Otherwise, you will receive a letter about your results with an explanation, but please check with MyChart first.  Please remember to sign up for MyChart if you have not done so, as this will be important to you in the future with finding out test results, communicating by private email, and scheduling acute appointments online when needed.  Please return in 6 months, or sooner if needed

## 2018-06-23 NOTE — Assessment & Plan Note (Signed)
I suspect likely exac by steroid at last hospn; for f/u lab today

## 2018-06-23 NOTE — Assessment & Plan Note (Signed)
Lab Results  Component Value Date   HGBA1C 5.5 12/15/2013  stable overall by history and exam, recent data reviewed with pt, and pt to continue medical treatment as before,  to f/u any worsening symptoms or concerns]

## 2018-06-23 NOTE — Assessment & Plan Note (Signed)
stable overall by history and exam, recent data reviewed with pt, and pt to continue medical treatment as before,  to f/u any worsening symptoms or concerns BP Readings from Last 3 Encounters:  06/23/18 120/84  05/16/18 110/78  05/02/18 124/88

## 2018-07-04 ENCOUNTER — Encounter

## 2018-07-04 ENCOUNTER — Encounter (HOSPITAL_COMMUNITY): Payer: Self-pay | Admitting: Psychiatry

## 2018-07-04 ENCOUNTER — Ambulatory Visit (INDEPENDENT_AMBULATORY_CARE_PROVIDER_SITE_OTHER): Payer: Medicare Other | Admitting: Psychiatry

## 2018-07-04 VITALS — BP 132/74 | HR 80 | Ht 63.0 in | Wt 176.0 lb

## 2018-07-04 DIAGNOSIS — F3160 Bipolar disorder, current episode mixed, unspecified: Secondary | ICD-10-CM

## 2018-07-04 MED ORDER — CARBAMAZEPINE ER 200 MG PO CP12: 200.0000 mg | ORAL_CAPSULE | Freq: Two times a day (BID) | ORAL | Status: DC

## 2018-07-04 MED ORDER — CARBAMAZEPINE ER 200 MG PO CP12: 200.0000 mg | ORAL_CAPSULE | Freq: Two times a day (BID) | ORAL | 4 refills | Status: DC
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
## 2018-07-08 ENCOUNTER — Other Ambulatory Visit: Payer: Self-pay | Admitting: Allergy and Immunology

## 2018-07-08 ENCOUNTER — Other Ambulatory Visit: Payer: Self-pay | Admitting: Internal Medicine

## 2018-07-08 ENCOUNTER — Other Ambulatory Visit: Payer: Self-pay | Admitting: Emergency Medicine

## 2018-07-08 DIAGNOSIS — J441 Chronic obstructive pulmonary disease with (acute) exacerbation: Secondary | ICD-10-CM

## 2018-07-08 DIAGNOSIS — L501 Idiopathic urticaria: Secondary | ICD-10-CM

## 2018-07-08 DIAGNOSIS — J3089 Other allergic rhinitis: Secondary | ICD-10-CM

## 2018-07-08 DIAGNOSIS — J45901 Unspecified asthma with (acute) exacerbation: Secondary | ICD-10-CM

## 2018-07-09 ENCOUNTER — Other Ambulatory Visit: Payer: Self-pay | Admitting: Internal Medicine

## 2018-07-09 MED ORDER — FLUTICASONE PROPIONATE 50 MCG/ACT NA SUSP: 2.0000 | Freq: Every day | NASAL | 5 refills | Status: DC

## 2018-07-10 ENCOUNTER — Other Ambulatory Visit: Payer: Self-pay | Admitting: Internal Medicine

## 2018-07-15 ENCOUNTER — Ambulatory Visit: Payer: Medicare Other

## 2018-07-15 ENCOUNTER — Ambulatory Visit (INDEPENDENT_AMBULATORY_CARE_PROVIDER_SITE_OTHER): Payer: Medicare Other | Admitting: Allergy and Immunology

## 2018-07-15 ENCOUNTER — Encounter: Payer: Self-pay | Admitting: Allergy and Immunology

## 2018-07-15 VITALS — BP 130/72 | HR 75 | Resp 16 | Ht 63.0 in | Wt 170.0 lb

## 2018-07-15 DIAGNOSIS — J301 Allergic rhinitis due to pollen: Secondary | ICD-10-CM | POA: Diagnosis not present

## 2018-07-15 DIAGNOSIS — L501 Idiopathic urticaria: Secondary | ICD-10-CM | POA: Diagnosis not present

## 2018-07-15 DIAGNOSIS — J441 Chronic obstructive pulmonary disease with (acute) exacerbation: Secondary | ICD-10-CM | POA: Diagnosis not present

## 2018-07-15 MED ORDER — PREDNISONE 10 MG PO TABS: ORAL_TABLET | ORAL | 0 refills | Status: DC
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
## 2018-07-15 NOTE — Patient Instructions (Addendum)
Idiopathic urticaria/pruritus  Prednisone has been provided, 20 mg x 4 days, 10 mg x1 day, then stop.  Instructions have been discussed and provided for H1/H2 receptor blockade with titration to find lowest effective dose.  We will see if the Xolair injections can be given every 3 weeks rather than every 4 weeks.  COPD/asthma Stable.  Continue Symbicort 160-4.5 g, 2 inhalations twice a day.  I have recommended using a spacer device.  Continue albuterol HFA, 1 to 2 inhalations every 6 hours if needed.  Subjective and objective measures of pulmonary function will be followed and the treatment plan will be adjusted accordingly.   Return in about 4 months (around 11/15/2018), or if symptoms worsen or fail to improve.  Urticaria (Hives)  . Fexofenadine (Allegra) 180 mg once a day.  If symptoms continue then increase to .  Marland Kitchen Fexofenadine (Allegra) 180 mg  twice a day.  If symptoms continue then increase to .  Marland Kitchen Fexofenadine (Allegra) 180 mg  twice a day and Ranitidine (Zantac) 150 mg once a day.  If symptoms continue then increase to.  Marland Kitchen Fexofenadine (Allegra) 180 mg  twice a day and Ranitidine (Zantac) 150 mg twice a day  May use Benadryl as needed for breakthrough symptoms       If no symptoms for 7 days, then step down dosage

## 2018-07-15 NOTE — Assessment & Plan Note (Signed)
Stable.  Continue Symbicort 160-4.5 g, 2 inhalations twice a day.  I have recommended using a spacer device.  Continue albuterol HFA, 1 to 2 inhalations every 6 hours if needed.  Subjective and objective measures of pulmonary function will be followed and the treatment plan will be adjusted accordingly.

## 2018-07-15 NOTE — Assessment & Plan Note (Signed)
   Prednisone has been provided, 20 mg x 4 days, 10 mg x1 day, then stop.  Instructions have been discussed and provided for H1/H2 receptor blockade with titration to find lowest effective dose.  We will see if the Xolair injections can be given every 3 weeks rather than every 4 weeks.

## 2018-07-28 ENCOUNTER — Other Ambulatory Visit: Payer: Self-pay | Admitting: Internal Medicine

## 2018-07-30 ENCOUNTER — Telehealth: Payer: Self-pay

## 2018-07-30 NOTE — Telephone Encounter (Signed)
Dr. Padgett will you please advise.  

## 2018-07-30 NOTE — Telephone Encounter (Signed)
I have cc'd [name] to this.   Per Dr. Mariane Masters last note he wanted to see if Xolair can be pushed up to every 3 weeks instead of 4 (which if we can she would be able to get injection 8/20).   [name], is this possible for her?  What is her current antihistamine regimen?   Her medication list has zyrtec, xyzal and zantac on it.

## 2018-07-30 NOTE — Telephone Encounter (Signed)
Patient is having severe itching. Her next xolair appt is 08/12/18. When she is on Prednisone the itching stops but once she stops she is back itching soon. She is requesting more prednisone or something to stop the itching.    [facility]

## 2018-08-01 ENCOUNTER — Other Ambulatory Visit: Payer: Self-pay | Admitting: *Deleted

## 2018-08-01 MED ORDER — PREDNISONE 20 MG PO TABS: 20.0000 mg | ORAL_TABLET | Freq: Two times a day (BID) | ORAL | 0 refills | Status: AC

## 2018-08-01 MED ORDER — MONTELUKAST SODIUM 10 MG PO TABS: 10.0000 mg | ORAL_TABLET | Freq: Every day | ORAL | 5 refills | Status: DC

## 2018-08-01 NOTE — Telephone Encounter (Signed)
I called [name] and she is taking Benadryl every 4-6 hours. Allegra: 1 in the morning and 1 at night. Zyrtec, Xyzal, and Zantac daily. She did state that she is still itching.

## 2018-08-01 NOTE — Telephone Encounter (Signed)
Please ask pt why she stopped singulair.   This can be added back in to her regimen if she tolerates this medication.   She already maxed out on antihistamine with allegra twice a day as well as zyrtec and xyzal daily.   She can increase zantac to twice a day.    To get her better controlled from this current episode can send in prednisone 20mg  twice a day x 5 days.     At this time need to continue Xolair every 4 weeks until we get approval from insurance to go to every 3 weeks.

## 2018-08-01 NOTE — Telephone Encounter (Signed)
I called and spoke with the patient and there was no particular reason that she stopped Singulair. I let her know that we will go ahead and re-start the medication per Dr. Nelva Bush. I let her know that a prescription for prednisone will also be sent in to her pharmacy. I informed her of the Xolair injections still being processed with her insurance.

## 2018-08-12 ENCOUNTER — Ambulatory Visit (INDEPENDENT_AMBULATORY_CARE_PROVIDER_SITE_OTHER): Payer: Medicare Other | Admitting: *Deleted

## 2018-08-12 DIAGNOSIS — J4550 Severe persistent asthma, uncomplicated: Secondary | ICD-10-CM

## 2018-08-19 ENCOUNTER — Ambulatory Visit: Payer: Self-pay | Admitting: Internal Medicine

## 2018-09-02 ENCOUNTER — Ambulatory Visit (INDEPENDENT_AMBULATORY_CARE_PROVIDER_SITE_OTHER): Payer: Medicare Other | Admitting: *Deleted

## 2018-09-02 DIAGNOSIS — L501 Idiopathic urticaria: Secondary | ICD-10-CM | POA: Diagnosis not present

## 2018-09-02 MED ORDER — OMALIZUMAB 150 MG ~~LOC~~ SOLR
300.0000 mg | SUBCUTANEOUS | Status: DC
  Administered 2018-09-02 – 2018-12-30 (×10): 300 mg via SUBCUTANEOUS

## 2018-09-09 ENCOUNTER — Ambulatory Visit: Payer: Medicare Other

## 2018-09-16 ENCOUNTER — Ambulatory Visit (INDEPENDENT_AMBULATORY_CARE_PROVIDER_SITE_OTHER): Payer: Medicare Other | Admitting: *Deleted

## 2018-09-16 DIAGNOSIS — L501 Idiopathic urticaria: Secondary | ICD-10-CM

## 2018-09-22 ENCOUNTER — Ambulatory Visit: Payer: Self-pay | Admitting: *Deleted

## 2018-09-22 NOTE — Telephone Encounter (Signed)
Pt called with complaints of dizziness when she stands up starting 09/19/18, and headache 09/20/18 at 0500; she also complains of itching that started on 09/17/18; the pt also says that her medication was stopped when she had surgery; additionally the pt would like to discuss seeing another neurologist instead of Dr Tat;  recommendations made per nurse triage protocol; pt offered and accepted appointment with Dr Cathlean Cower, [place], 09/23/18 at 1600; she verbalized understanding; will route to office for notification  Reason for Disposition . [1] MODERATE dizziness (e.g., interferes with normal activities) AND [2] has been evaluated by physician for this  Answer Assessment - Initial Assessment Questions 1. DESCRIPTION: "Describe your dizziness."     Light headed, "feels like my head is too big for my body"  2. LIGHTHEADED: "Do you feel lightheaded?" (e.g., somewhat faint, woozy, weak upon standing)     Weak upon standing 3. VERTIGO: "Do you feel like either you or the room is spinning or tilting?" (i.e. vertigo)     no 4. SEVERITY: "How bad is it?"  "Do you feel like you are going to faint?" "Can you stand and walk?"   - MILD - walking normally   - MODERATE - interferes with normal activities (e.g., work, school)    - SEVERE - unable to stand, requires support to walk, feels like passing out now.      no 5. ONSET:  "When did the dizziness begin?"     09/19/18 6. AGGRAVATING FACTORS: "Does anything make it worse?" (e.g., standing, change in head position)     standing 7. HEART RATE: "Can you tell me your heart rate?" "How many beats in 15 seconds?"  (Note: not all patients can do this)       no 8. CAUSE: "What do you think is causing the dizziness?"     Stress, depression 9. RECURRENT SYMPTOM: "Have you had dizziness before?" If so, ask: "When was the last time?" "What happened that time?"     Yes, refered to Dr Tat 10. OTHER SYMPTOMS: "Do you have any other symptoms?" (e.g., fever, chest pain,  vomiting, diarrhea, bleeding)       no 11. PREGNANCY: "Is there any chance you are pregnant?" "When was your last menstrual period?"       no  Protocols used: DIZZINESS [facility]

## 2018-09-23 ENCOUNTER — Ambulatory Visit (INDEPENDENT_AMBULATORY_CARE_PROVIDER_SITE_OTHER): Payer: Medicare Other | Admitting: Internal Medicine

## 2018-09-23 ENCOUNTER — Encounter: Payer: Self-pay | Admitting: Internal Medicine

## 2018-09-23 VITALS — BP 128/84 | HR 82 | Temp 98.5°F | Ht 63.0 in | Wt 181.0 lb

## 2018-09-23 DIAGNOSIS — Z23 Encounter for immunization: Secondary | ICD-10-CM | POA: Diagnosis not present

## 2018-09-23 DIAGNOSIS — J309 Allergic rhinitis, unspecified: Secondary | ICD-10-CM | POA: Diagnosis not present

## 2018-09-23 DIAGNOSIS — R42 Dizziness and giddiness: Secondary | ICD-10-CM

## 2018-09-23 DIAGNOSIS — I10 Essential (primary) hypertension: Secondary | ICD-10-CM

## 2018-09-23 DIAGNOSIS — J441 Chronic obstructive pulmonary disease with (acute) exacerbation: Secondary | ICD-10-CM

## 2018-09-23 DIAGNOSIS — H6982 Other specified disorders of Eustachian tube, left ear: Secondary | ICD-10-CM

## 2018-09-23 DIAGNOSIS — R7303 Prediabetes: Secondary | ICD-10-CM

## 2018-09-23 MED ORDER — METHYLPREDNISOLONE ACETATE 80 MG/ML IJ SUSP
80.0000 mg | Freq: Once | INTRAMUSCULAR | Status: AC
  Administered 2018-09-23: 80 mg via INTRAMUSCULAR

## 2018-09-23 MED ORDER — PREDNISONE 10 MG PO TABS: ORAL_TABLET | ORAL | 0 refills | Status: DC

## 2018-09-23 MED ORDER — MECLIZINE HCL 12.5 MG PO TABS: 12.5000 mg | ORAL_TABLET | Freq: Three times a day (TID) | ORAL | 1 refills | Status: DC | PRN

## 2018-09-23 NOTE — Assessment & Plan Note (Signed)
stable overall by history and exam, recent data reviewed with pt, and pt to continue medical treatment as before,  to f/u any worsening symptoms or concerns  

## 2018-09-23 NOTE — Assessment & Plan Note (Signed)
Also for mucinex otc prn

## 2018-09-23 NOTE — Patient Instructions (Signed)
You had the steroid shot today  Please take all new medication as prescribed - the prednisone, and the meclizine as needed for the dizziness  You can also take Mucinex (or it's generic off brand) for congestion, and tylenol as needed for pain if needed  Please continue all other medications as before, and refills have been done if requested.  Please have the pharmacy call with any other refills you may need.  Please keep your appointments with your specialists as you may have planned

## 2018-09-23 NOTE — Progress Notes (Signed)
Subjective:    Patient ID: [name], female    DOB: [birth year], 57 y.o.   MRN: [id number]  HPI Here with 2 wks acute onset mild left ear pain, pressure, headache, but without general weakness, colored d/c, but with mild ST and scant productive cough, but pt denies chest pain, wheezing, increased sob or doe, orthopnea, PND, increased LE swelling, palpitations, or syncope. Does have several wks ongoing nasal allergy symptoms with clearish congestion, itch and sneezing, without fever, pain, ST, cough, swelling or wheezing, but has had significant vertigo with room spinning with position change and occasional vomiting.  No prior hx of same. Past Medical History:  Diagnosis Date  . ALLERGIC RHINITIS 05/12/2009   Qualifier: Diagnosis of  By: Lenn Cal Deborra Medina), Wynona Canes    . Anemia, iron deficiency 12/22/2014  . Angina   . Anxiety   . Arthritis   . Asthma   . ASTHMA 05/12/2009   Severe AFL (Spirometry 05/2009: pre-BD FEV1 0.87L 34% pred, post-BD FEV1 1.11L 44% pred) Volumes hyperinflated Decreased DLCO that does not fully correct to normal range for alveolar volume.     Marland Kitchen COPD 08/24/2009   Qualifier: Diagnosis of  By: Burnett Kanaris    . COPD (chronic obstructive pulmonary disease) ([place])   . Depression   . Fibromyalgia 05/14/2014  . GERD (gastroesophageal reflux disease)   . Hyperlipidemia 04/20/2017  . HYPERTENSION 05/12/2009   Qualifier: Diagnosis of  By: Lenn Cal Deborra Medina), Wynona Canes    . Migraine   . Nephrolithiasis   . Peripheral vascular disease ([place])   . Prediabetes 02/23/2014  . Seizure ([place])   . Seizure ([place])   . Urticaria    Past Surgical History:  Procedure Laterality Date  . ABDOMINAL HYSTERECTOMY    . BACK SURGERY    . COLONOSCOPY  12/20/2011   Procedure: COLONOSCOPY;  Surgeon: Landry Dyke, MD;  Location: [facility] ENDOSCOPY;  Service: Endoscopy;  Laterality: N/A;  . COLONOSCOPY  03/05/2012   Procedure: COLONOSCOPY;  Surgeon: Landry Dyke, MD;  Location: [facility] ENDOSCOPY;   Service: Endoscopy;  Laterality: N/A;  . DIAGNOSTIC LAPAROSCOPY    . HEMORRHOID SURGERY    . INCISE AND DRAIN ABCESS    . KIDNEY STONE SURGERY    . NECK SURGERY    . TOE SURGERY    . TUBAL LIGATION      reports that she has been smoking cigarettes. She has a 18.00 pack-year smoking history. She has never used smokeless tobacco. She reports that she drank alcohol. She reports that she has current or past drug history. Drug: Marijuana. family history includes Alcohol abuse in her brother and father; Anesthesia problems in her father; Asthma in her brother, mother, and sister; Brain cancer in her sister; COPD in her mother; Cancer in her father; Diabetes in her brother, father, mother, sister, and sister; Heart disease in her brother, mother, and sister; Hypertension in her sister; Kidney disease in her father; Sleep apnea in her brother. Allergies  Allergen Reactions  . Sulfa Antibiotics Nausea And Vomiting  . Sulfonamide Derivatives Nausea And Vomiting    REACTION: n/v   Current Outpatient Medications on File Prior to Visit  Medication Sig Dispense Refill  . albuterol (PROVENTIL HFA;VENTOLIN HFA) 108 (90 Base) MCG/ACT inhaler INHALE 1 TO 2 PUFFS BY MOUTH EVERY 4 TO 6 HOURS AS NEEDED (Patient taking differently: INHALE 1 TO 2 PUFFS BY MOUTH EVERY 4 TO 6 HOURS AS NEEDED FOR SHORTNESS OF BREATH) 18 g 5  . albuterol (PROVENTIL) (  2.5 MG/3ML) 0.083% nebulizer solution Take 2.5 mg by nebulization every 4 (four) hours as needed for wheezing or shortness of breath. Reported on 06/04/2016    . carbamazepine (EQUETRO) 200 MG CP12 12 hr capsule Take 1 capsule (200 mg total) by mouth 2 (two) times daily. 60 each 4  . cetirizine (ZYRTEC) 10 MG tablet TAKE 1 TABLET(10 MG) BY MOUTH DAILY 30 tablet 2  . estradiol (CLIMARA - DOSED IN MG/24 HR) 0.05 mg/24hr patch Place 1 patch (0.05 mg total) onto the skin once a week. 12 patch 4  . fluticasone (FLONASE) 50 MCG/ACT nasal spray Place 2 sprays into both nostrils  daily. 16 g 5  . hydrochlorothiazide (MICROZIDE) 12.5 MG capsule TAKE 1 CAPSULE(12.5 MG) BY MOUTH DAILY 30 capsule 5  . levocetirizine (XYZAL) 5 MG tablet TAKE 1 TABLET BY MOUTH EVERY DAY AS NEEDED FOR ALLERGIES 30 tablet 0  . montelukast (SINGULAIR) 10 MG tablet Take 1 tablet (10 mg total) by mouth at bedtime. 30 tablet 5  . Omalizumab (XOLAIR [place]) Inject 1 Applicatorful into the skin every 30 (thirty) days.    . pantoprazole (PROTONIX) 40 MG tablet TAKE 1 TABLET BY MOUTH 30 TO 60 MINUTES BEFORE FIRST MEAL OF THE DAY AS DIRECTED 90 tablet 0  . pravastatin (PRAVACHOL) 40 MG tablet TAKE 1 TABLET(40 MG) BY MOUTH DAILY 90 tablet 1  . ranitidine (ZANTAC) 150 MG tablet Take 1 tablet (150 mg total) by mouth 2 (two) times daily as needed for heartburn. 60 tablet 2  . SYMBICORT 160-4.5 MCG/ACT inhaler INHALE 2 PUFFS BY MOUTH TWICE DAILY 10.2 g 0  . tiZANidine (ZANAFLEX) 4 MG tablet TAKE 1 TABLET(4 MG) BY MOUTH EVERY 6 HOURS AS NEEDED FOR MUSCLE SPASMS 60 tablet 0  . TRELEGY ELLIPTA 100-62.5-25 MCG/INH AEPB INL 1 PUFF ITL D  5   Current Facility-Administered Medications on File Prior to Visit  Medication Dose Route Frequency Provider Last Rate Last Dose  . carbamazepine (EQUETRO) 12 hr capsule 200 mg  200 mg Oral BID Plovsky, Berneta Sages, MD      . omalizumab Ar[place] Right) injection 300 mg  300 mg Subcutaneous Q14 Days Valentina Shaggy, MD   300 mg at 09/16/18 4196   Review of Systems  Constitutional: Negative for other unusual diaphoresis or sweats HENT: Negative for ear discharge or swelling Eyes: Negative for other worsening visual disturbances Respiratory: Negative for stridor or other swelling  Gastrointestinal: Negative for worsening distension or other blood Genitourinary: Negative for retention or other urinary change Musculoskeletal: Negative for other MSK pain or swelling Skin: Negative for color change or other new lesions Neurological: Negative for worsening tremors and other numbness    Psychiatric/Behavioral: Negative for worsening agitation or other fatigue All other system neg per pt    Objective:   Physical Exam BP 128/84   Pulse 82   Temp 98.5 F (36.9 C) (Oral)   Ht 5\' 3"  (1.6 m)   Wt 181 lb (82.1 kg)   SpO2 92%   BMI 32.06 kg/m  VS noted, not ill appearing Constitutional: Pt appears in NAD HENT: Head: NCAT.  Right Ear: External ear normal.  Left Ear: External ear normal.  Eyes: . Pupils are equal, round, and reactive to light. Conjunctivae and EOM are normal Bilat tm's with mild erythema.  Max sinus areas non tender.  Pharynx with mild erythema, no exudate Nose: without d/c or deformity Neck: Neck supple. Gross normal ROM Cardiovascular: Normal rate and regular rhythm.   Pulmonary/Chest: Effort normal  and breath sounds without rales or wheezing.  Neurological: Pt is alert. At baseline orientation, motor grossly intact, cn 2-12 intact Skin: Skin is warm. No rashes, other new lesions, no LE edema Psychiatric: Pt behavior is normal without agitation  No other exam findings    Assessment & Plan:

## 2018-09-23 NOTE — Assessment & Plan Note (Signed)
Mild to mod, for meclizine prn, to f/u any worsening symptoms or concerns 

## 2018-09-23 NOTE — Assessment & Plan Note (Signed)
Mild to mod, for depomedrol IM 80, predpac asd, to f/u any worsening symptoms or concerns 

## 2018-09-30 ENCOUNTER — Ambulatory Visit (INDEPENDENT_AMBULATORY_CARE_PROVIDER_SITE_OTHER): Payer: Medicare Other | Admitting: *Deleted

## 2018-09-30 DIAGNOSIS — L501 Idiopathic urticaria: Secondary | ICD-10-CM

## 2018-10-01 ENCOUNTER — Ambulatory Visit (INDEPENDENT_AMBULATORY_CARE_PROVIDER_SITE_OTHER): Payer: Medicare Other | Admitting: Psychiatry

## 2018-10-01 DIAGNOSIS — F3162 Bipolar disorder, current episode mixed, moderate: Secondary | ICD-10-CM

## 2018-10-01 MED ORDER — CARBAMAZEPINE ER 200 MG PO CP12: 200.0000 mg | ORAL_CAPSULE | Freq: Two times a day (BID) | ORAL | 4 refills | Status: DC
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
## 2018-10-02 ENCOUNTER — Other Ambulatory Visit (HOSPITAL_COMMUNITY): Payer: Self-pay

## 2018-10-02 DIAGNOSIS — Z79899 Other long term (current) drug therapy: Secondary | ICD-10-CM

## 2018-10-03 ENCOUNTER — Other Ambulatory Visit (INDEPENDENT_AMBULATORY_CARE_PROVIDER_SITE_OTHER): Payer: Medicare Other

## 2018-10-03 ENCOUNTER — Ambulatory Visit (INDEPENDENT_AMBULATORY_CARE_PROVIDER_SITE_OTHER): Payer: Medicare Other | Admitting: Family

## 2018-10-03 ENCOUNTER — Encounter: Payer: Self-pay | Admitting: Family

## 2018-10-03 ENCOUNTER — Other Ambulatory Visit: Payer: Self-pay | Admitting: Family

## 2018-10-03 VITALS — BP 132/74 | HR 75 | Temp 97.6°F | Ht 63.0 in | Wt 184.0 lb

## 2018-10-03 DIAGNOSIS — J019 Acute sinusitis, unspecified: Secondary | ICD-10-CM | POA: Diagnosis not present

## 2018-10-03 DIAGNOSIS — D72829 Elevated white blood cell count, unspecified: Secondary | ICD-10-CM | POA: Diagnosis not present

## 2018-10-03 LAB — CBC WITH DIFFERENTIAL/PLATELET
Basophils Absolute: 0 10*3/uL (ref 0.0–0.2)
Basophils Absolute: 0.1 10*3/uL (ref 0.0–0.1)
Basophils Relative: 0.9 % (ref 0.0–3.0)
Basos: 0 %
EOS (ABSOLUTE): 0.1 10*3/uL (ref 0.0–0.4)
Eos: 1 %
Eosinophils Absolute: 0.1 10*3/uL (ref 0.0–0.7)
Eosinophils Relative: 0.9 % (ref 0.0–5.0)
HCT: 35.6 % — ABNORMAL LOW (ref 36.0–46.0)
Hematocrit: 34.1 % (ref 34.0–46.6)
Hemoglobin: 10.8 g/dL — ABNORMAL LOW (ref 11.1–15.9)
Hemoglobin: 11.6 g/dL — ABNORMAL LOW (ref 12.0–15.0)
Immature Grans (Abs): 0.2 10*3/uL — ABNORMAL HIGH (ref 0.0–0.1)
Immature Granulocytes: 1 %
Lymphocytes Absolute: 3.5 10*3/uL — ABNORMAL HIGH (ref 0.7–3.1)
Lymphocytes Relative: 25.7 % (ref 12.0–46.0)
Lymphs Abs: 3.8 10*3/uL (ref 0.7–4.0)
Lymphs: 24 %
MCH: 26.6 pg (ref 26.6–33.0)
MCHC: 31.7 g/dL (ref 31.5–35.7)
MCHC: 32.7 g/dL (ref 30.0–36.0)
MCV: 84 fL (ref 79–97)
MCV: 84.4 fl (ref 78.0–100.0)
Monocytes Absolute: 0.8 10*3/uL (ref 0.1–0.9)
Monocytes Absolute: 1.1 10*3/uL — ABNORMAL HIGH (ref 0.1–1.0)
Monocytes Relative: 7.4 % (ref 3.0–12.0)
Monocytes: 6 %
Neutro Abs: 9.5 10*3/uL — ABNORMAL HIGH (ref 1.4–7.7)
Neutrophils Absolute: 10.2 10*3/uL — ABNORMAL HIGH (ref 1.4–7.0)
Neutrophils Relative %: 65.1 % (ref 43.0–77.0)
Neutrophils: 68 %
Platelets: 420 10*3/uL (ref 150–450)
Platelets: 416 10*3/uL — ABNORMAL HIGH (ref 150.0–400.0)
RBC: 4.06 x10E6/uL (ref 3.77–5.28)
RBC: 4.21 Mil/uL (ref 3.87–5.11)
RDW: 14.7 % (ref 12.3–15.4)
RDW: 17.1 % — ABNORMAL HIGH (ref 11.5–15.5)
WBC: 14.8 10*3/uL — ABNORMAL HIGH (ref 3.4–10.8)
WBC: 14.6 10*3/uL — ABNORMAL HIGH (ref 4.0–10.5)

## 2018-10-03 LAB — COMPREHENSIVE METABOLIC PANEL
ALT: 15 IU/L (ref 0–32)
AST: 16 IU/L (ref 0–40)
Albumin/Globulin Ratio: 2 (ref 1.2–2.2)
Albumin: 4.3 g/dL (ref 3.5–5.5)
Alkaline Phosphatase: 98 IU/L (ref 39–117)
BUN/Creatinine Ratio: 20 (ref 9–23)
BUN: 16 mg/dL (ref 6–24)
Bilirubin Total: <0.2 mg/dL (ref 0.0–1.2)
CO2: 27 mmol/L (ref 20–29)
Calcium: 8.7 mg/dL (ref 8.7–10.2)
Chloride: 98 mmol/L (ref 96–106)
Creatinine, Ser: 0.8 mg/dL (ref 0.57–1.00)
GFR calc Af Amer: 96 mL/min/{1.73_m2} (ref >59)
GFR calc non Af Amer: 83 mL/min/{1.73_m2} (ref >59)
Globulin, Total: 2.1 g/dL (ref 1.5–4.5)
Glucose: 133 mg/dL — ABNORMAL HIGH (ref 65–99)
Potassium: 4 mmol/L (ref 3.5–5.2)
Sodium: 142 mmol/L (ref 134–144)
Total Protein: 6.4 g/dL (ref 6.0–8.5)

## 2018-10-03 LAB — CARBAMAZEPINE LEVEL, TOTAL: Carbamazepine (Tegretol), S: 4 ug/mL (ref 4.0–12.0)

## 2018-10-03 MED ORDER — AMOXICILLIN-POT CLAVULANATE 875-125 MG PO TABS: 1.0000 | ORAL_TABLET | Freq: Two times a day (BID) | ORAL | 0 refills | Status: DC

## 2018-10-03 NOTE — Progress Notes (Signed)
Felicia STORMER is a 57 y.o. female with the following history as recorded in Epic[employer]:  Patient Active Problem List   Diagnosis Date Noted  . Vertigo 09/23/2018  . Dysfunction of left eustachian tube 09/23/2018  . Gait disorder 04/21/2018  . Leukocytosis 04/15/2018  . Nausea & vomiting 04/15/2018  . Seizure ([place])   . Cervical radiculopathy 02/18/2018  . 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 Syncope 09/04/2017  . Constipation 09/04/2017  . Nonintractable headache 09/04/2017  . Leg swelling 07/26/2017  . 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 Hyperlipidemia 04/20/2017  . Pedal edema 04/19/2017  . Bipolar I disorder ([place]) 09/19/2016  . Facial pain 07/12/2016  . Rash 06/14/2016  . Migraine 01/25/2016  . Idiopathic urticaria/pruritus 01/23/2016  . Perennial allergic rhinitis 01/23/2016  . Oral candidiasis 01/23/2016  . Greater trochanteric bursitis of both hips 09/08/2015  . Chest pain 06/22/2015  . Itching 06/22/2015  . Bilateral knee pain 06/22/2015  . Bilateral hip pain 06/22/2015  . Anemia, iron deficiency 12/22/2014  . Fatigue 12/21/2014  . Intertrigo 08/07/2014  . Dyspareunia 07/12/2014  . Menopausal syndrome (hot flushes) 07/12/2014  . History of tobacco abuse 07/12/2014  . Dizziness 06/22/2014  . Weakness 06/22/2014  . Fibromyalgia 05/14/2014  . Routine general medical examination at a health care facility 04/22/2014  . Recurrent boils 04/22/2014  . Recurrent falls 04/22/2014  . Peripheral vascular disease ([place])   . Depression   . Anxiety   . GERD (gastroesophageal reflux disease)   . Prediabetes 02/23/2014  . Screening mammogram for high-risk patient 02/23/2014  . Back pain 07/22/2013  . COPD/asthma 08/24/2009  . Headache(784.0) 08/24/2009  . HTN (hypertension) 05/12/2009  . Allergic rhinitis 05/12/2009  . Asthma 05/12/2009    Current Outpatient Medications  Medication Sig Dispense Refill  . albuterol (PROVENTIL HFA;VENTOLIN HFA) 108 (90 Base)  MCG/ACT inhaler INHALE 1 TO 2 PUFFS BY MOUTH EVERY 4 TO 6 HOURS AS NEEDED (Patient taking differently: INHALE 1 TO 2 PUFFS BY MOUTH EVERY 4 TO 6 HOURS AS NEEDED FOR SHORTNESS OF BREATH) 18 g 5  . albuterol (PROVENTIL) (2.5 MG/3ML) 0.083% nebulizer solution Take 2.5 mg by nebulization every 4 (four) hours as needed for wheezing or shortness of breath. Reported on 06/04/2016    . amoxicillin-clavulanate (AUGMENTIN) 875-125 MG tablet Take 1 tablet by mouth 2 (two) times daily. 20 tablet 0  . carbamazepine (EQUETRO) 200 MG CP12 12 hr capsule Take 1 capsule (200 mg total) by mouth 2 (two) times daily. 60 each 4  . estradiol (CLIMARA - DOSED IN MG/24 HR) 0.05 mg/24hr patch Place 1 patch (0.05 mg total) onto the skin once a week. 12 patch 4  . fluticasone (FLONASE) 50 MCG/ACT nasal spray Place 2 sprays into both nostrils daily. 16 g 5  . hydrochlorothiazide (MICROZIDE) 12.5 MG capsule TAKE 1 CAPSULE(12.5 MG) BY MOUTH DAILY 30 capsule 5  . levocetirizine (XYZAL) 5 MG tablet TAKE 1 TABLET BY MOUTH EVERY DAY AS NEEDED FOR ALLERGIES 30 tablet 0  . meclizine (ANTIVERT) 12.5 MG tablet Take 1 tablet (12.5 mg total) by mouth 3 (three) times daily as needed for dizziness. 30 tablet 1  . montelukast (SINGULAIR) 10 MG tablet Take 1 tablet (10 mg total) by mouth at bedtime. 30 tablet 5  . Omalizumab (XOLAIR [place]) Inject 1 Applicatorful into the skin every 30 (thirty) days.    . pantoprazole (PROTONIX) 40 MG tablet TAKE 1 TABLET BY MOUTH 30 TO 60 MINUTES BEFORE FIRST MEAL OF THE DAY  AS DIRECTED 90 tablet 0  . pravastatin (PRAVACHOL) 40 MG tablet TAKE 1 TABLET(40 MG) BY MOUTH DAILY 90 tablet 1  . ranitidine (ZANTAC) 150 MG tablet Take 1 tablet (150 mg total) by mouth 2 (two) times daily as needed for heartburn. 60 tablet 2  . SYMBICORT 160-4.5 MCG/ACT inhaler INHALE 2 PUFFS BY MOUTH TWICE DAILY 10.2 g 0  . tiZANidine (ZANAFLEX) 4 MG tablet TAKE 1 TABLET(4 MG) BY MOUTH EVERY 6 HOURS AS NEEDED FOR MUSCLE SPASMS 60 tablet 0  .  TRELEGY ELLIPTA 100-62.5-25 MCG/INH AEPB INL 1 PUFF ITL D  5   Current Facility-Administered Medications  Medication Dose Route Frequency Provider Last Rate Last Dose  . carbamazepine (EQUETRO) 12 hr capsule 200 mg  200 mg Oral BID Plovsky, Berneta Sages, MD      . omalizumab Arvid Right) injection 300 mg  300 mg Subcutaneous Q14 Days Valentina Shaggy, MD   300 mg at 09/30/18 1209    Allergies: Sulfa antibiotics and Sulfonamide derivatives  Past Medical History:  Diagnosis Date  . ALLERGIC RHINITIS 05/12/2009   Qualifier: Diagnosis of  By: Lenn Cal Deborra Medina), Wynona Canes    . Anemia, iron deficiency 12/22/2014  . Angina   . Anxiety   . Arthritis   . Asthma   . ASTHMA 05/12/2009   Severe AFL (Spirometry 05/2009: pre-BD FEV1 0.87L 34% pred, post-BD FEV1 1.11L 44% pred) Volumes hyperinflated Decreased DLCO that does not fully correct to normal range for alveolar volume.     Marland Kitchen COPD 08/24/2009   Qualifier: Diagnosis of  By: Burnett Kanaris    . COPD (chronic obstructive pulmonary disease) ([place])   . Depression   . Fibromyalgia 05/14/2014  . GERD (gastroesophageal reflux disease)   . Hyperlipidemia 04/20/2017  . HYPERTENSION 05/12/2009   Qualifier: Diagnosis of  By: Lenn Cal Deborra Medina), Wynona Canes    . Migraine   . Nephrolithiasis   . Peripheral vascular disease ([place])   . Prediabetes 02/23/2014  . Seizure ([place])   . Seizure ([place])   . Urticaria     Past Surgical History:  Procedure Laterality Date  . ABDOMINAL HYSTERECTOMY    . BACK SURGERY    . COLONOSCOPY  12/20/2011   Procedure: COLONOSCOPY;  Surgeon: Landry Dyke, MD;  [facility];  Service: Endoscopy;  Laterality: N/A;  . COLONOSCOPY  03/05/2012   Procedure: COLONOSCOPY;  Surgeon: Landry Dyke, MD;  Location[facility];  Service: Endoscopy;  Laterality: N/A;  . DIAGNOSTIC LAPAROSCOPY    . HEMORRHOID SURGERY    . INCISE AND DRAIN ABCESS    . KIDNEY STONE SURGERY    . NECK SURGERY    . TOE SURGERY    . TUBAL LIGATION      Family  History  Problem Relation Age of Onset  . Diabetes Mother   . COPD Mother   . Heart disease Mother   . Asthma Mother   . Diabetes Father   . Kidney disease Father   . Cancer Father   . Anesthesia problems Father   . Alcohol abuse Father   . Diabetes Sister   . Hypertension Sister   . Diabetes Brother   . Sleep apnea Brother   . Asthma Brother   . Alcohol abuse Brother   . Heart disease Sister   . Diabetes Sister   . Brain cancer Sister   . Heart disease Brother   . Asthma Sister   . Allergic rhinitis Neg Hx   . Eczema Neg Hx   .  Urticaria Neg Hx     Social History   Tobacco Use  . Smoking status: Current Some Day Smoker    Packs/day: 0.50    Years: 36.00    Pack years: 18.00    Types: Cigarettes    Last attempt to quit: 05/09/2018    Years since quitting: 0.4  . Smokeless tobacco: Never Used  . Tobacco comment: Passive smoker.  Patient states her quit date was 05/09/2018.  Patient educated with resources at today's appointment to continue to support her to stop smoking.  Substance Use Topics  . Alcohol use: Not Currently    Alcohol/week: 0.0 standard drinks    Subjective:  Patient was seen by her PCP last week and treated for allergy flare with 9 day course of prednisone. She saw her psychiatrist yesterday who ordered routine labs- noted WBC was elevated at 14; recommended follow-up with her PCP; she does complain of sinus pain/ pressure behind her eyes; "feels like my whole head is full." + bilateral ear pain; denies any chest pain or shortness of breath; no nausea or unexplained weight loss.    Objective:  Vitals:   10/03/18 1143  BP: 132/74  Pulse: 75  Temp: 97.6 F (36.4 C)  TempSrc: Oral  SpO2: 97%  Weight: 184 lb (83.5 kg)  Height: 5\' 3"  (1.6 m)    General: Well developed, well nourished, in no acute distress  Skin : Warm and dry.  Head: Normocephalic and atraumatic  Eyes: Sclera and conjunctiva clear; pupils round and reactive to light; extraocular  movements intact  Ears: External normal; canals clear; tympanic membranes congested bilaterally Oropharynx: Pink, supple. No suspicious lesions  Neck: Supple without thyromegaly, adenopathy  Lungs: Respirations unlabored; clear to auscultation bilaterally without wheeze, rales, rhonchi  CVS exam: normal rate and regular rhythm.  Neurologic: Alert and oriented; speech intact; face symmetrical; moves all extremities well; CNII-XII intact without focal deficit   Assessment:  1. Leukocytosis, unspecified type   2. Acute sinusitis, recurrence not specified, unspecified location     Plan:  Suspect secondary to acute sinus infection; re-check WBC today to make sure stable; start Augmentin 875 mg bid x 10 days; follow-up to be determined based on results of CBC today.   No follow-ups on file.  Orders Placed This Encounter  Procedures  . CBC with Differential/Platelet    Standing Status:   Future    Number of Occurrences:   1    Standing Expiration Date:   10/03/2019    Requested Prescriptions   Signed Prescriptions Disp Refills  . amoxicillin-clavulanate (AUGMENTIN) 875-125 MG tablet 20 tablet 0    Sig: Take 1 tablet by mouth 2 (two) times daily.

## 2018-10-14 ENCOUNTER — Ambulatory Visit (INDEPENDENT_AMBULATORY_CARE_PROVIDER_SITE_OTHER)
Admission: RE | Admit: 2018-10-14 | Discharge: 2018-10-14 | Disposition: A | Discharge status: Home / Self Care | Payer: Medicare Other | Source: Ambulatory Visit | Attending: Family | Admitting: Family

## 2018-10-14 ENCOUNTER — Ambulatory Visit (INDEPENDENT_AMBULATORY_CARE_PROVIDER_SITE_OTHER): Payer: Medicare Other | Admitting: *Deleted

## 2018-10-14 ENCOUNTER — Encounter: Payer: Self-pay | Admitting: Family

## 2018-10-14 ENCOUNTER — Ambulatory Visit: Payer: Self-pay

## 2018-10-14 ENCOUNTER — Ambulatory Visit (INDEPENDENT_AMBULATORY_CARE_PROVIDER_SITE_OTHER): Payer: Medicare Other | Admitting: Family

## 2018-10-14 VITALS — BP 128/78 | HR 84 | Temp 98.5°F | Ht 63.0 in | Wt 185.0 lb

## 2018-10-14 DIAGNOSIS — L501 Idiopathic urticaria: Secondary | ICD-10-CM

## 2018-10-14 DIAGNOSIS — R059 Cough, unspecified: Secondary | ICD-10-CM

## 2018-10-14 DIAGNOSIS — R05 Cough: Secondary | ICD-10-CM

## 2018-10-14 DIAGNOSIS — J441 Chronic obstructive pulmonary disease with (acute) exacerbation: Secondary | ICD-10-CM

## 2018-10-14 MED ORDER — PREDNISONE 10 MG PO TABS: ORAL_TABLET | ORAL | 0 refills | Status: DC

## 2018-10-14 MED ORDER — DOXYCYCLINE HYCLATE 100 MG PO TABS: 100.0000 mg | ORAL_TABLET | Freq: Two times a day (BID) | ORAL | 0 refills | Status: DC

## 2018-10-14 MED ORDER — METHYLPREDNISOLONE ACETATE 40 MG/ML IJ SUSP
40.0000 mg | Freq: Once | INTRAMUSCULAR | Status: AC
  Administered 2018-10-14: 40 mg via INTRAMUSCULAR

## 2018-10-14 NOTE — Telephone Encounter (Signed)
Pt c/o congested non productive cough since 10/03/18. Pt was seen by provider and was diagnosed with acute sinus infection. Pt with history of asthma and COPD. Pt is a smoker and trying to quit "cold [place]."  Pt had frequent cough during call. Pt denies fever. Pt stated that her cough is more productive in the morning but decreases during the day. Pt is coughing up clear secretions. Pt with wheezing and uses Albuterol inhaler as needed and Albuterol nebulizer for wheezing and shortness of breath. Pt also having runny nose and chest pain with coughing. Pt has been using Mucinex DM and cough drops for the cough.  Care advice given and pt verbalized understanding. Pt's PCP not in office today. Appointment made with Jodi Mourning FNP at 3:40 pm today.  Reason for Disposition . [1] Known COPD or other severe lung disease (i.e., bronchiectasis, cystic fibrosis, lung surgery) AND [2] worsening symptoms (i.e., increased sputum purulence or amount, increased breathing difficulty . Wheezing is present    Pt is wheezing- uses Albuterol for wheezing h/o COPD  Answer Assessment - Initial Assessment Questions 1. ONSET: "When did the cough begin?"      10/03/18 2. SEVERITY: "How bad is the cough today?"      Congested, loses breath with coughing, shortness of breath 3. RESPIRATORY DISTRESS: "Describe your breathing."      Congested wheezing 4. FEVER: "Do you have a fever?" If so, ask: "What is your temperature, how was it measured, and when did it start?"     no 5. SPUTUM: "Describe the color of your sputum" (clear, white, yellow, green)     Clear coughing up a lot of sputum especially in the morning 6. HEMOPTYSIS: "Are you coughing up any blood?" If so ask: "How much?" (flecks, streaks, tablespoons, etc.)     no 7. CARDIAC HISTORY: "Do you have any history of heart disease?" (e.g., heart attack, congestive heart failure)      no 8. LUNG HISTORY: "Do you have any history of lung disease?"  (e.g., pulmonary  embolus, asthma, emphysema)     Asthma and COPD 9. PE RISK FACTORS: "Do you have a history of blood clots?" (or: recent major surgery, recent prolonged travel, bedridden)     no 10. OTHER SYMPTOMS: "Do you have any other symptoms?" (e.g., runny nose, wheezing, chest pain)       Runny nose, wheezing, congested cough, chest pain with coughing 11. PREGNANCY: "Is there any chance you are pregnant?" "When was your last menstrual period?"       n/a 12. TRAVEL: "Have you traveled out of the country in the last month?" (e.g., travel history, exposures)       no  Protocols used: [place]

## 2018-10-14 NOTE — Progress Notes (Signed)
[name] is a 57 y.o. female with the following history as recorded in EpicCare:  Patient Active Problem List   Diagnosis Date Noted  . Vertigo 09/23/2018  . Dysfunction of left eustachian tube 09/23/2018  . Gait disorder 04/21/2018  . Leukocytosis 04/15/2018  . Nausea & vomiting 04/15/2018  . Seizure ([place])   . Cervical radiculopathy 02/18/2018  . 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 Syncope 09/04/2017  . Constipation 09/04/2017  . Nonintractable headache 09/04/2017  . Leg swelling 07/26/2017  . 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 Hyperlipidemia 04/20/2017  . Pedal edema 04/19/2017  . Bipolar I disorder ([place]) 09/19/2016  . Facial pain 07/12/2016  . Rash 06/14/2016  . Migraine 01/25/2016  . Idiopathic urticaria/pruritus 01/23/2016  . Perennial allergic rhinitis 01/23/2016  . Oral candidiasis 01/23/2016  . Greater trochanteric bursitis of both hips 09/08/2015  . Chest pain 06/22/2015  . Itching 06/22/2015  . Bilateral knee pain 06/22/2015  . Bilateral hip pain 06/22/2015  . Anemia, iron deficiency 12/22/2014  . Fatigue 12/21/2014  . Intertrigo 08/07/2014  . Dyspareunia 07/12/2014  . Menopausal syndrome (hot flushes) 07/12/2014  . History of tobacco abuse 07/12/2014  . Dizziness 06/22/2014  . Weakness 06/22/2014  . Fibromyalgia 05/14/2014  . Routine general medical examination at a health care facility 04/22/2014  . Recurrent boils 04/22/2014  . Recurrent falls 04/22/2014  . Peripheral vascular disease ([place])   . Depression   . Anxiety   . GERD (gastroesophageal reflux disease)   . Prediabetes 02/23/2014  . Screening mammogram for high-risk patient 02/23/2014  . Back pain 07/22/2013  . COPD/asthma 08/24/2009  . Headache(784.0) 08/24/2009  . HTN (hypertension) 05/12/2009  . Allergic rhinitis 05/12/2009  . Asthma 05/12/2009    Current Outpatient Medications  Medication Sig Dispense Refill  . albuterol (PROVENTIL HFA;VENTOLIN HFA) 108 (90 Base)  MCG/ACT inhaler INHALE 1 TO 2 PUFFS BY MOUTH EVERY 4 TO 6 HOURS AS NEEDED (Patient taking differently: INHALE 1 TO 2 PUFFS BY MOUTH EVERY 4 TO 6 HOURS AS NEEDED FOR SHORTNESS OF BREATH) 18 g 5  . albuterol (PROVENTIL) (2.5 MG/3ML) 0.083% nebulizer solution Take 2.5 mg by nebulization every 4 (four) hours as needed for wheezing or shortness of breath. Reported on 06/04/2016    . amoxicillin-clavulanate (AUGMENTIN) 875-125 MG tablet Take 1 tablet by mouth 2 (two) times daily. 20 tablet 0  . carbamazepine (EQUETRO) 200 MG CP12 12 hr capsule Take 1 capsule (200 mg total) by mouth 2 (two) times daily. 60 each 4  . estradiol (CLIMARA - DOSED IN MG/24 HR) 0.05 mg/24hr patch Place 1 patch (0.05 mg total) onto the skin once a week. 12 patch 4  . fluticasone (FLONASE) 50 MCG/ACT nasal spray Place 2 sprays into both nostrils daily. 16 g 5  . hydrochlorothiazide (MICROZIDE) 12.5 MG capsule TAKE 1 CAPSULE(12.5 MG) BY MOUTH DAILY 30 capsule 5  . levocetirizine (XYZAL) 5 MG tablet TAKE 1 TABLET BY MOUTH EVERY DAY AS NEEDED FOR ALLERGIES 30 tablet 0  . meclizine (ANTIVERT) 12.5 MG tablet Take 1 tablet (12.5 mg total) by mouth 3 (three) times daily as needed for dizziness. 30 tablet 1  . montelukast (SINGULAIR) 10 MG tablet Take 1 tablet (10 mg total) by mouth at bedtime. 30 tablet 5  . Omalizumab (XOLAIR Tiburones) Inject 1 Applicatorful into the skin every 30 (thirty) days.    . pantoprazole (PROTONIX) 40 MG tablet TAKE 1 TABLET BY MOUTH 30 TO 60 MINUTES BEFORE FIRST MEAL OF THE DAY  AS DIRECTED 90 tablet 0  . pravastatin (PRAVACHOL) 40 MG tablet TAKE 1 TABLET(40 MG) BY MOUTH DAILY 90 tablet 1  . ranitidine (ZANTAC) 150 MG tablet Take 1 tablet (150 mg total) by mouth 2 (two) times daily as needed for heartburn. 60 tablet 2  . SYMBICORT 160-4.5 MCG/ACT inhaler INHALE 2 PUFFS BY MOUTH TWICE DAILY 10.2 g 0  . tiZANidine (ZANAFLEX) 4 MG tablet TAKE 1 TABLET(4 MG) BY MOUTH EVERY 6 HOURS AS NEEDED FOR MUSCLE SPASMS 60 tablet 0  .  doxycycline (VIBRA-TABS) 100 MG tablet Take 1 tablet (100 mg total) by mouth 2 (two) times daily. 20 tablet 0  . predniSONE (DELTASONE) 10 MG tablet 3 tabs by mouth per day for 3 days,2tabs per day for 3 days,1tab per day for 3 days 18 tablet 0   Current Facility-Administered Medications  Medication Dose Route Frequency Provider Last Rate Last Dose  . carbamazepine (EQUETRO) 12 hr capsule 200 mg  200 mg Oral BID Plovsky, Berneta Sages, MD      . omalizumab Arvid Right) injection 300 mg  300 mg Subcutaneous Q14 Days Valentina Shaggy, MD   300 mg at 10/14/18 3557    Allergies: Sulfa antibiotics and Sulfonamide derivatives  Past Medical History:  Diagnosis Date  . ALLERGIC RHINITIS 05/12/2009   Qualifier: Diagnosis of  By: Lenn Cal Deborra Medina), Wynona Canes    . Anemia, iron deficiency 12/22/2014  . Angina   . Anxiety   . Arthritis   . Asthma   . ASTHMA 05/12/2009   Severe AFL (Spirometry 05/2009: pre-BD FEV1 0.87L 34% pred, post-BD FEV1 1.11L 44% pred) Volumes hyperinflated Decreased DLCO that does not fully correct to normal range for alveolar volume.     Marland Kitchen COPD 08/24/2009   Qualifier: Diagnosis of  By: Burnett Kanaris    . COPD (chronic obstructive pulmonary disease) ([place])   . Depression   . Fibromyalgia 05/14/2014  . GERD (gastroesophageal reflux disease)   . Hyperlipidemia 04/20/2017  . HYPERTENSION 05/12/2009   Qualifier: Diagnosis of  By: Lenn Cal Deborra Medina), Wynona Canes    . Migraine   . Nephrolithiasis   . Peripheral vascular disease ([place])   . Prediabetes 02/23/2014  . Seizure ([place])   . Seizure ([place])   . Urticaria     Past Surgical History:  Procedure Laterality Date  . ABDOMINAL HYSTERECTOMY    . BACK SURGERY    . COLONOSCOPY  12/20/2011   Procedure: COLONOSCOPY;  Surgeon: Landry Dyke, MD;  Location: [facility];  Service: Endoscopy;  Laterality: N/A;  . COLONOSCOPY  03/05/2012   Procedure: COLONOSCOPY;  Surgeon: Landry Dyke, MD;  Location: [facility];  Service: Endoscopy;  Laterality:  N/A;  . DIAGNOSTIC LAPAROSCOPY    . HEMORRHOID SURGERY    . INCISE AND DRAIN ABCESS    . KIDNEY STONE SURGERY    . NECK SURGERY    . TOE SURGERY    . TUBAL LIGATION      Family History  Problem Relation Age of Onset  . Diabetes Mother   . COPD Mother   . Heart disease Mother   . Asthma Mother   . Diabetes Father   . Kidney disease Father   . Cancer Father   . Anesthesia problems Father   . Alcohol abuse Father   . Diabetes Sister   . Hypertension Sister   . Diabetes Brother   . Sleep apnea Brother   . Asthma Brother   . Alcohol abuse Brother   . Heart disease Sister   .  Diabetes Sister   . Brain cancer Sister   . Heart disease Brother   . Asthma Sister   . Allergic rhinitis Neg Hx   . Eczema Neg Hx   . Urticaria Neg Hx     Social History   Tobacco Use  . Smoking status: Current Some Day Smoker    Packs/day: 0.50    Years: 36.00    Pack years: 18.00    Types: Cigarettes    Last attempt to quit: 05/09/2018    Years since quitting: 0.4  . Smokeless tobacco: Never Used  . Tobacco comment: Passive smoker.  Patient states her quit date was 05/09/2018.  Patient educated with resources at today's appointment to continue to support her to stop smoking.  Substance Use Topics  . Alcohol use: Not Currently    Alcohol/week: 0.0 standard drinks    Subjective:  Patient presents with COPD exacerbation- these particular symptoms started in the past 2 days; finished antibiotics yesterday for recent sinus infection; + wheezing; + productive cough; no fever;    Objective:  Vitals:   10/14/18 1543  BP: 128/78  Pulse: 84  Temp: 98.5 F (36.9 C)  TempSrc: Oral  SpO2: 96%  Weight: 185 lb 0.6 oz (83.9 kg)  Height: 5\' 3"  (1.6 m)    General: Well developed, well nourished, in no acute distress  Skin : Warm and dry.  Head: Normocephalic and atraumatic  Eyes: Sclera and conjunctiva clear; pupils round and reactive to light; extraocular movements intact  Ears: External normal;  canals clear; tympanic membranes normal  Oropharynx: Pink, supple. No suspicious lesions  Neck: Supple without thyromegaly, adenopathy  Lungs: Respirations unlabored; wheezing noted in all 4 lobes- per patient, she took a breathing treatment right before office visit CVS exam: normal rate and regular rhythm.  Neurologic: Alert and oriented; speech intact; face symmetrical; moves all extremities well; CNII-XII intact without focal deficit   Assessment:  1. Cough     Plan:  COPD exacerbation; update CXR; Depo-Medrol IM 40 mg given in office; re-treat with prednisone taper and second round of antibiotics; she understands she need to see her pulmonologist in follow-up.  No follow-ups on file.  Orders Placed This Encounter  Procedures  . DG Chest 2 View    Standing Status:   Future    Number of Occurrences:   1    Standing Expiration Date:   12/15/2019    Order Specific Question:   Reason for Exam (SYMPTOM  OR DIAGNOSIS REQUIRED)    Answer:   cough/ wheezing    Order Specific Question:   Is patient pregnant?    Answer:   No    Order Specific Question:   Preferred imaging location?    Answer:   [facility]    Order Specific Question:   Radiology Contrast Protocol - do NOT remove file path    Answer:   [email or web address]    Requested Prescriptions   Signed Prescriptions Disp Refills  . doxycycline (VIBRA-TABS) 100 MG tablet 20 tablet 0    Sig: Take 1 tablet (100 mg total) by mouth 2 (two) times daily.  . predniSONE (DELTASONE) 10 MG tablet 18 tablet 0    Sig: 3 tabs by mouth per day for 3 days,2tabs per day for 3 days,1tab per day for 3 days

## 2018-10-22 ENCOUNTER — Ambulatory Visit: Payer: Self-pay | Admitting: Neurology

## 2018-10-28 ENCOUNTER — Ambulatory Visit (INDEPENDENT_AMBULATORY_CARE_PROVIDER_SITE_OTHER): Payer: Medicare Other | Admitting: *Deleted

## 2018-10-28 DIAGNOSIS — J4550 Severe persistent asthma, uncomplicated: Secondary | ICD-10-CM

## 2018-11-10 ENCOUNTER — Other Ambulatory Visit: Payer: Self-pay | Admitting: Internal Medicine

## 2018-11-11 ENCOUNTER — Ambulatory Visit (INDEPENDENT_AMBULATORY_CARE_PROVIDER_SITE_OTHER): Payer: Medicare Other | Admitting: *Deleted

## 2018-11-11 DIAGNOSIS — L501 Idiopathic urticaria: Secondary | ICD-10-CM

## 2018-11-11 NOTE — Addendum Note (Signed)
Addended by: Carin Hock on: 11/11/2018 03:37 PM   Modules accepted: Orders

## 2018-11-25 ENCOUNTER — Ambulatory Visit (INDEPENDENT_AMBULATORY_CARE_PROVIDER_SITE_OTHER): Payer: Medicare Other | Admitting: *Deleted

## 2018-11-25 DIAGNOSIS — L508 Other urticaria: Secondary | ICD-10-CM

## 2018-12-05 ENCOUNTER — Telehealth: Payer: Self-pay | Admitting: Allergy and Immunology

## 2018-12-05 MED ORDER — ALBUTEROL SULFATE HFA 108 (90 BASE) MCG/ACT IN AERS: 2.0000 | INHALATION_SPRAY | RESPIRATORY_TRACT | 0 refills | Status: DC | PRN

## 2018-12-05 NOTE — Telephone Encounter (Signed)
Sent in refill for albuterol inhaler. Patient needs office visit for additional refills.

## 2018-12-05 NOTE — Telephone Encounter (Signed)
Pt called and needs to have a albuterol inhaler called into [facility] on [address] [phone].

## 2018-12-09 ENCOUNTER — Ambulatory Visit: Payer: Medicare Other

## 2018-12-09 ENCOUNTER — Encounter: Payer: Self-pay | Admitting: Allergy & Immunology

## 2018-12-09 ENCOUNTER — Ambulatory Visit (INDEPENDENT_AMBULATORY_CARE_PROVIDER_SITE_OTHER): Payer: Medicare Other | Admitting: Allergy & Immunology

## 2018-12-09 VITALS — BP 122/74 | HR 70 | Temp 98.1°F | Resp 18

## 2018-12-09 DIAGNOSIS — J310 Chronic rhinitis: Secondary | ICD-10-CM | POA: Diagnosis not present

## 2018-12-09 DIAGNOSIS — L508 Other urticaria: Secondary | ICD-10-CM

## 2018-12-09 DIAGNOSIS — J441 Chronic obstructive pulmonary disease with (acute) exacerbation: Secondary | ICD-10-CM

## 2018-12-09 DIAGNOSIS — J45901 Unspecified asthma with (acute) exacerbation: Secondary | ICD-10-CM

## 2018-12-09 MED ORDER — OSELTAMIVIR PHOSPHATE 75 MG PO CAPS: 75.0000 mg | ORAL_CAPSULE | Freq: Two times a day (BID) | ORAL | 0 refills | Status: AC

## 2018-12-09 MED ORDER — BUDESONIDE-FORMOTEROL FUMARATE 160-4.5 MCG/ACT IN AERO: 2.0000 | INHALATION_SPRAY | Freq: Two times a day (BID) | RESPIRATORY_TRACT | 2 refills | Status: DC

## 2018-12-09 MED ORDER — METHYLPREDNISOLONE ACETATE 80 MG/ML IJ SUSP
80.0000 mg | Freq: Once | INTRAMUSCULAR | Status: AC
  Administered 2018-12-09: 80 mg via INTRAMUSCULAR

## 2018-12-09 NOTE — Patient Instructions (Addendum)
1. Asthma-COPD overlap syndrome - Lung testing looked terrible today, but it did improve with the nebulizer treatments. - DepoMedrol 80mg  given in clinic today. - Come back later this week (Friday) for your Xolair injection. - Daily controller medication(s): Symbicort 160/4.11mcg two puffs twice daily with spacer + Xolair every two weeks - Prior to physical activity: ProAir 2 puffs 10-15 minutes before physical activity. - Rescue medications: ProAir 4 puffs every 4-6 hours as needed or albuterol nebulizer one vial puffs every 4-6 hours as needed - Asthma control goals:  * Full participation in all desired activities (may need albuterol before activity) * Albuterol use two time or less a week on average (not counting use with activity) * Cough interfering with sleep two time or less a month * Oral steroids no more than once a year * No hospitalizations  2. Chronic urticaria - Continue with Xolair every two weeks. - Continue with an antihistamine as needed.  3. Positive flu exposure - Start Tamiflu twice daily for five days. - Let us know how you feel when you come back on Friday for your Xolair injection.   4. Return in about 3 days (around 12/12/2018).    Please inform us of any Emergency Department visits, hospitalizations, or changes in symptoms. Call us before going to the ED for breathing or allergy symptoms since we might be able to fit you in for a sick visit. Feel free to contact us anytime with any questions, problems, or concerns.  It was a pleasure to see you again today!   Websites that have reliable patient information: 1. American Academy of Asthma, Allergy, and Immunology: www.aaaai.org 2. Food Allergy Research and Education (FARE): foodallergy.org 3. Mothers of Asthmatics: http://www.asthmacommunitynetwork.org 4. American College of Allergy, Asthma, and Immunology: www.acaai.org

## 2018-12-09 NOTE — Progress Notes (Signed)
FOLLOW UP  Date of Service/Encounter:  12/09/18   Assessment:   Asthma with COPD with exacerbation - without reversibility today  Chronic urticaria - on Xolair every two weeks  Plan/Recommendations:   1. Asthma-COPD overlap syndrome - Lung testing looked terrible today, but it did improve with the nebulizer treatments. - DepoMedrol 80mg  given in clinic today. - Come back later this week (Friday) for your Xolair injection. - For the next visit, consider workup for ABPA or A1AT deficiency.  - Could consider changing to [place] in the future for control of her asthma-COPD overlap, which does have case reports of improvement of urticaria as well.  - Daily controller medication(s): Symbicort 160/4.71mcg two puffs twice daily with spacer + Xolair every two weeks - Prior to physical activity: ProAir 2 puffs 10-15 minutes before physical activity. - Rescue medications: ProAir 4 puffs every 4-6 hours as needed or albuterol nebulizer one vial puffs every 4-6 hours as needed - Asthma control goals:  * Full participation in all desired activities (may need albuterol before activity) * Albuterol use two time or less a week on average (not counting use with activity) * Cough interfering with sleep two time or less a month * Oral steroids no more than once a year * No hospitalizations  2. Chronic urticaria - Continue with Xolair every two weeks. - Continue with an antihistamine as needed.  3. Positive flu exposure - Start Tamiflu twice daily for five days. - Let us know how you feel when you come back on Friday for your Xolair injection.   4. Return in about 3 days (around 12/12/2018).   Subjective:   [name] is a 57 y.o. female presenting today for follow up of  Chief Complaint  Patient presents with  . Asthma    attach yesterday, took 2 breathing treatments    [name] has a history of the following: Patient Active Problem List   Diagnosis Date Noted    . Vertigo 09/23/2018  . Dysfunction of left eustachian tube 09/23/2018  . Gait disorder 04/21/2018  . Leukocytosis 04/15/2018  . Nausea & vomiting 04/15/2018  . Seizure ([name])   . Cervical radiculopathy 02/18/2018  . 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 Syncope 09/04/2017  . Constipation 09/04/2017  . Nonintractable headache 09/04/2017  . Leg swelling 07/26/2017  . 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 Hyperlipidemia 04/20/2017  . Pedal edema 04/19/2017  . Bipolar I disorder ([place]) 09/19/2016  . Facial pain 07/12/2016  . Rash 06/14/2016  . Migraine 01/25/2016  . Idiopathic urticaria/pruritus 01/23/2016  . Perennial allergic rhinitis 01/23/2016  . Oral candidiasis 01/23/2016  . Greater trochanteric bursitis of both hips 09/08/2015  . Chest pain 06/22/2015  . Itching 06/22/2015  . Bilateral knee pain 06/22/2015  . Bilateral hip pain 06/22/2015  . Anemia, iron deficiency 12/22/2014  . Fatigue 12/21/2014  . Intertrigo 08/07/2014  . Dyspareunia 07/12/2014  . Menopausal syndrome (hot flushes) 07/12/2014  . History of tobacco abuse 07/12/2014  . Dizziness 06/22/2014  . Weakness 06/22/2014  . Fibromyalgia 05/14/2014  . Routine general medical examination at a health care facility 04/22/2014  . Recurrent boils 04/22/2014  . Recurrent falls 04/22/2014  . Peripheral vascular disease ([place])   . Depression   . Anxiety   . GERD (gastroesophageal reflux disease)   . Prediabetes 02/23/2014  . Screening mammogram for high-risk patient 02/23/2014  . Back pain 07/22/2013  . COPD/asthma 08/24/2009  . Headache(784.0) 08/24/2009  . HTN (hypertension) 05/12/2009  .  Allergic rhinitis 05/12/2009  . Asthma 05/12/2009    History obtained from: chart review and patient.  [place] Primary Care Provider is Biagio Borg, MD.     [name] is a 57 y.o. female presenting for a follow up visit. She was last seen in July 2019 for a sick visit. She was complaining of  generalized pruritis at that time with a sensation of crawling insects on her skin. She was given prednisone. Her Xolair was increased to every three weeks. I last saw her in January 2019 at which time her lung function was stable. We tried to simplify her regimen with Trelegy one puff once daily. She continued her on Xolair monthly. We also treated with Levaquin for sinusitis.   Since the last visit, she has mostly done well.  However, over the last couple of days, she has had worsening asthma symptoms.  She has been using her nebulizer multiple times in the last 24 to 48 hours.  Of note, she was around family members with confirmed flu over the early part of the weekend. She does endorse symptoms of generalized aches as well as sinus pressure for 1-2 days. She denies any fevers at this point.  She did receive her flu shot.   Review of the last year shows that she has received prednisone now today as well as July 2019, May 2019, April 2019, and January 2019 (5 courses in a 12 months period). Dr. Melvyn Novas has mentioned "pseudoasthma" in his last note in May 2019, and while I think this is certainly a consideration, her spirometry is awful today as it was the last time that I saw her. She did have an [place] of 200 in July 2019.   Her urticaria have been well controlled at this point. She seems to be doing well on the twice monthly Xolair injections. She has not systemic reactions with her hives requiring an EpiPen.   She has a history of mixed bipolar 1 disorder and is followed by Dr. Norma Fredrickson. Otherwise, there have been no changes to her past medical history, surgical history, family history, or social history.    Review of Systems: a 14-point review of systems is pertinent for what is mentioned in HPI.  Otherwise, all other systems were negative.  Constitutional: negative other than that listed in the HPI Eyes: negative other than that listed in the HPI Ears, nose, mouth, throat, and face: negative  other than that listed in the HPI Respiratory: negative other than that listed in the HPI Cardiovascular: negative other than that listed in the HPI Gastrointestinal: negative other than that listed in the HPI Genitourinary: negative other than that listed in the HPI Integument: negative other than that listed in the HPI Hematologic: negative other than that listed in the HPI Musculoskeletal: negative other than that listed in the HPI Neurological: negative other than that listed in the HPI Allergy/Immunologic: negative other than that listed in the HPI    Objective:   Blood pressure 122/74, pulse 70, temperature 98.1 F (36.7 C), temperature source Oral, resp. rate 18, SpO2 98 %. There is no height or weight on file to calculate BMI.   Physical Exam:  General: Alert, interactive, in mild distress.  Eyes: No conjunctival injection bilaterally, no discharge on the right, no discharge on the left and no Horner-Trantas dots present. PERRL bilaterally. EOMI without pain. No photophobia.  Ears: Right TM pearly gray with normal light reflex, Left TM pearly gray with normal light reflex,  Right TM intact without perforation and Left TM intact without perforation.  Nose/Throat: External nose within normal limits and septum midline. Turbinates edematous without discharge. Posterior oropharynx mildly erythematous without cobblestoning in the posterior oropharynx. Tonsils 2+ without exudates.  Tongue without thrush. Lungs: Decreased breath sounds bilaterally without wheezing, rhonchi or rales. Increased work of breathing. CV: Normal S1/S2. No murmurs. Capillary refill <2 seconds.  Skin: Warm and dry, without lesions or rashes. Neuro:   Grossly intact. No focal deficits appreciated. Responsive to questions.  Diagnostic studies:   Spirometry: results abnormal (FEV1: 0.70/33%, FVC: 2.39/90%, FEV1/FVC: 29%).    Spirometry consistent with severe obstructive disease. Two Xopenex/Atrovent nebulizer  treatments given in clinic with significant improvement in the FEF25-75% of 217%. The FVC did decrease, decreasing back to her baseline level. The FEV1 did not change at all, however.  Allergy Studies: none      Salvatore Marvel, MD  [facility] [place] [facility] [place]

## 2018-12-11 ENCOUNTER — Encounter: Payer: Self-pay | Admitting: Allergy & Immunology

## 2018-12-12 ENCOUNTER — Ambulatory Visit (INDEPENDENT_AMBULATORY_CARE_PROVIDER_SITE_OTHER): Payer: Medicare Other | Admitting: *Deleted

## 2018-12-12 DIAGNOSIS — J4550 Severe persistent asthma, uncomplicated: Secondary | ICD-10-CM

## 2018-12-15 ENCOUNTER — Other Ambulatory Visit: Payer: Self-pay | Admitting: Allergy

## 2018-12-15 MED ORDER — ALBUTEROL SULFATE HFA 108 (90 BASE) MCG/ACT IN AERS: 2.0000 | INHALATION_SPRAY | RESPIRATORY_TRACT | 3 refills | Status: DC | PRN

## 2018-12-17 ENCOUNTER — Other Ambulatory Visit: Payer: Self-pay | Admitting: Family

## 2018-12-17 ENCOUNTER — Other Ambulatory Visit: Payer: Self-pay | Admitting: Allergy & Immunology

## 2018-12-17 ENCOUNTER — Other Ambulatory Visit: Payer: Self-pay | Admitting: Internal Medicine

## 2018-12-17 ENCOUNTER — Other Ambulatory Visit: Payer: Self-pay | Admitting: Allergy and Immunology

## 2018-12-17 ENCOUNTER — Other Ambulatory Visit: Payer: Self-pay | Admitting: Emergency Medicine

## 2018-12-19 ENCOUNTER — Ambulatory Visit (INDEPENDENT_AMBULATORY_CARE_PROVIDER_SITE_OTHER): Payer: Medicare Other | Admitting: Psychiatry

## 2018-12-19 ENCOUNTER — Encounter (HOSPITAL_COMMUNITY): Payer: Self-pay | Admitting: Psychiatry

## 2018-12-19 VITALS — BP 142/80 | HR 80 | Ht 63.0 in | Wt 184.0 lb

## 2018-12-19 DIAGNOSIS — F3160 Bipolar disorder, current episode mixed, unspecified: Secondary | ICD-10-CM

## 2018-12-19 MED ORDER — CARBAMAZEPINE 200 MG PO TABS: ORAL_TABLET | ORAL | 3 refills | Status: DC
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
## 2018-12-22 ENCOUNTER — Telehealth: Payer: Self-pay | Admitting: Allergy & Immunology

## 2018-12-22 NOTE — Telephone Encounter (Signed)
Patient was seen 12-09-18. She was prescribed a medication, she doesn't know the name, but said it is not working and still has chest tightness. She is also requesting prednisone.

## 2018-12-22 NOTE — Telephone Encounter (Signed)
Please call patient and see if she can come to office for re-evaluation.

## 2018-12-22 NOTE — Telephone Encounter (Signed)
Spoke to patient advised to come in appt made for 12/23/18 @ 10 am with Dr Ernst Bowler

## 2018-12-22 NOTE — Telephone Encounter (Signed)
Called patient states she is having shortness of breath,chest tightness has been using albuterol neb tx at home and is not smoking. Patient states coughing in morning is worse but coughing up clear has shoulder pain but denies body aches, fever or chills. Dr [name] please advise patient was seen by Dr Ernst Bowler on 12/09/18

## 2018-12-23 ENCOUNTER — Encounter: Payer: Self-pay | Admitting: Allergy & Immunology

## 2018-12-23 ENCOUNTER — Ambulatory Visit
Admission: RE | Admit: 2018-12-23 | Discharge: 2018-12-23 | Disposition: A | Discharge status: Home / Self Care | Payer: Medicare Other | Source: Ambulatory Visit | Attending: Allergy & Immunology | Admitting: Allergy & Immunology

## 2018-12-23 ENCOUNTER — Ambulatory Visit (INDEPENDENT_AMBULATORY_CARE_PROVIDER_SITE_OTHER): Payer: Medicare Other | Admitting: Allergy & Immunology

## 2018-12-23 VITALS — BP 142/86 | HR 71 | Temp 98.4°F | Resp 18

## 2018-12-23 DIAGNOSIS — R059 Cough, unspecified: Secondary | ICD-10-CM

## 2018-12-23 DIAGNOSIS — J449 Chronic obstructive pulmonary disease, unspecified: Secondary | ICD-10-CM | POA: Diagnosis not present

## 2018-12-23 DIAGNOSIS — J3089 Other allergic rhinitis: Secondary | ICD-10-CM | POA: Diagnosis not present

## 2018-12-23 DIAGNOSIS — R05 Cough: Secondary | ICD-10-CM

## 2018-12-23 DIAGNOSIS — L501 Idiopathic urticaria: Secondary | ICD-10-CM | POA: Diagnosis not present

## 2018-12-23 MED ORDER — ALBUTEROL SULFATE (2.5 MG/3ML) 0.083% IN NEBU: 2.5000 mg | INHALATION_SOLUTION | RESPIRATORY_TRACT | 2 refills | Status: DC | PRN

## 2018-12-23 NOTE — Progress Notes (Signed)
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 viral    Ms. Barnfield presents for another sick visit.  I saw her 2 weeks ago with fairly similar symptoms.  We did give her a Depo-Medrol injection at that time which she says did help.  She presents again with chest congestion as well as chest tightness.  Today, she also has marked pruritus.  She did receive her Xolair after Christmas as recommended, so she is up-to-date on this.  She has been afebrile and she does have a cough productive of clear sputum.  At this point, there does not seem to be any bacterial etiology that needs treatment, but will make sure that is not the case with a chest x-ray.  We are going to encourage her to use suppressive doses of antihistamines at least for a week or 2 to help with the itching.  We will also give her a low-dose burst of prednisone to see if this can provide some relief.  We are going to get some labs to rule out serious causes of asthma that would require a different approach including alpha 1 antitrypsin deficiency as well as allergic bronchopulmonary aspergillosis.  At this time, we are going to keep her on Xolair but we are going to strongly consider transitioning to [place] if there is no improvement.  I still think she should be 1 to get her injection in the office, as I do suspect some component of noncompliance.  She is followed by pulmonology and I recommended that she call Dr. Lamonte Sakai to make another appointment.  She has seen Dr. Melvyn Novas in the past, who has diagnosed her with pseudo-asthma.  This is definitely a consideration, although there is improvement with the nebulizer treatments.    Plan/Recommendations:   1. Asthma-COPD overlap syndrome - Lung testing looked  terrible today, but it did improve with the nebulizer treatments. - Start the steroid pack provided.  - I will get some labs to look for serious causes of asthma: alpha-1 anti-tryptsin level and Aspergillus precipitins - We will call you in 1-2 weeks with the results of the testing.  - We are going to get a chest X-ray to make sure that you do not have pneumonia. - We are going to add on Spiriva one puff once daily to help keep your lungs open.  - I think another visit with Dr. Lamonte Sakai could be useful.  - Daily controller medication(s): Symbicort 160/4.83mcg two puffs twice daily with spacer + Spiriva 2.39mcg one puff once daily + Xolair every two weeks - Prior to physical activity: ProAir 2 puffs 10-15 minutes before physical activity. - Rescue medications: ProAir 4 puffs every 4-6 hours as needed or albuterol nebulizer one vial puffs every 4-6 hours as needed - Asthma control goals:  * Full participation in all desired activities (may need albuterol before activity) * Albuterol use two time or less a week on average (not counting use with activity) * Cough interfering with sleep two time or less a month * Oral steroids no more than once a year * No hospitalizations  2. Chronic urticaria - Continue with Xolair every two weeks. - Add on Zyrtec (cetrizine) 1-2 tablets up to twice daily until the itching is improved, then wean as tolerated.  3. Return in about 3 months (around 03/24/2019).    Subjective:   BRAYDEE SHIMKUS is a 58 y.o. female presenting today for follow up of  Chief Complaint  Patient presents with  . Cough  . Pruritis  . Shortness of Breath    ASHELY GOOSBY has a history of the following: Patient Active Problem List   Diagnosis Date Noted  . Vertigo 09/23/2018  . Dysfunction of left eustachian tube 09/23/2018  . Gait disorder 04/21/2018  . Leukocytosis 04/15/2018  . Nausea & vomiting 04/15/2018  . Seizure ([place])   . Cervical radiculopathy 02/18/2018  .  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 Syncope 09/04/2017  . Constipation 09/04/2017  . Nonintractable headache 09/04/2017  . Leg swelling 07/26/2017  . 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 Hyperlipidemia 04/20/2017  . Pedal edema 04/19/2017  . Bipolar I disorder ([place]) 09/19/2016  . Facial pain 07/12/2016  . Rash 06/14/2016  . Migraine 01/25/2016  . Idiopathic urticaria/pruritus 01/23/2016  . Perennial allergic rhinitis 01/23/2016  . Oral candidiasis 01/23/2016  . Greater trochanteric bursitis of both hips 09/08/2015  . Chest pain 06/22/2015  . Itching 06/22/2015  . Bilateral knee pain 06/22/2015  . Bilateral hip pain 06/22/2015  . Anemia, iron deficiency 12/22/2014  . Fatigue 12/21/2014  . Intertrigo 08/07/2014  . Dyspareunia 07/12/2014  . Menopausal syndrome (hot flushes) 07/12/2014  . History of tobacco abuse 07/12/2014  . Dizziness 06/22/2014  . Weakness 06/22/2014  . Fibromyalgia 05/14/2014  . Routine general medical examination at a health care facility 04/22/2014  . Recurrent boils 04/22/2014  . Recurrent falls 04/22/2014  . Peripheral vascular disease ([place])   . Depression   . Anxiety   . GERD (gastroesophageal reflux disease)   . Prediabetes 02/23/2014  . Screening mammogram for high-risk patient 02/23/2014  . Back pain 07/22/2013  . COPD/asthma 08/24/2009  . Headache(784.0) 08/24/2009  . HTN (hypertension) 05/12/2009  . Allergic rhinitis 05/12/2009  . Asthma 05/12/2009    History obtained from: chart review and patient.  [place] Primary Care Provider is Biagio Borg, MD.     Daleisa Halperin is a 58 y.o. female presenting for a follow up visit.  She was last seen on December 24 for a sick visit.  At that time, her lung testing looked awful but it did improve with her nebulizer treatment.  We gave her a Depo-Medrol 80 mg in clinic and recommended that she come back later that week to restart her Xolair.  We continued Symbicort 160/4.5 mcg 2 puffs  twice daily and Xolair every 2 weeks.  For her urticaria, we continued her on the Xolair as mentioned above as well as an antihistamine as needed.  We did start Tamiflu twice daily for 5 days due to a positive flu exposure.  Since the last visit, she has continued to have problems.  She does report that she improved after her last visit with [place].  However, over the weekend she did develop some chest tightness and worsening cough and congestion.  She has been using her rescue nebulizer, although she is using her sisters nebulizer solution so she is not sure how old it is.  She has not required an emergency room visit.  She has been afebrile and has had a good appetite.  Asthma/Respiratory Symptom History: She remains on Symbicort 2 puffs in the morning and 2 puffs at night.  She does not know whether she has been on Spiriva in the past.  She  did get her Xolair as recommended a few days after the last visit.  ACT score today is 8, indicating poor asthma control.   Allergic Rhinitis Symptom History: She had testing in January 2017 that was positive for dust mite only (very low level).  She is not on a regular antihistamine.  She does not use nasal sprays.  She has not needed antibiotics since last visit.  Otherwise, there have been no changes to her past medical history, surgical history, family history, or social history.    Review of Systems: a 14-point review of systems is pertinent for what is mentioned in HPI.  Otherwise, all other systems were negative.  Constitutional: negative other than that listed in the HPI Eyes: negative other than that listed in the HPI Ears, nose, mouth, throat, and face: negative other than that listed in the HPI Respiratory: negative other than that listed in the HPI Cardiovascular: negative other than that listed in the HPI Gastrointestinal: negative other than that listed in the HPI Genitourinary: negative other than that listed in the HPI Integument: negative  other than that listed in the HPI Hematologic: negative other than that listed in the HPI Musculoskeletal: negative other than that listed in the HPI Neurological: negative other than that listed in the HPI Allergy/Immunologic: negative other than that listed in the HPI    Objective:   Blood pressure (!) 142/86, pulse 71, temperature 98.4 F (36.9 C), temperature source Oral, resp. rate 18, SpO2 96 %. There is no height or weight on file to calculate BMI.   Physical Exam:  General: Alert, interactive, in no acute distress. Pleasant female. Smiling.  Eyes: No conjunctival injection bilaterally, no discharge on the right, no discharge on the left and no Horner-Trantas dots present. PERRL bilaterally. EOMI without pain. No photophobia.  Ears: Right TM pearly gray with normal light reflex, Left TM pearly gray with normal light reflex, Right TM intact without perforation and Left TM intact without perforation.  Nose/Throat: External nose within normal limits and septum midline. Turbinates edematous and pale with clear discharge. Posterior oropharynx erythematous without cobblestoning in the posterior oropharynx. Tonsils 2+ without exudates.  Tongue without thrush. Lungs: Clear to auscultation without wheezing, rhonchi or rales. No increased work of breathing. CV: Normal S1/S2. No murmurs. Capillary refill <2 seconds.  Skin: Warm and dry, without lesions or rashes. Neuro:   Grossly intact. No focal deficits appreciated. Responsive to questions.  Diagnostic studies:   Spirometry: results abnormal (FEV1: 0.64/30%, FVC: 1.24/47%, FEV1/FVC: 52%).    Spirometry consistent with mixed obstructive and restrictive disease. Xopenex/Atrovent nebulizer treatment given in clinic with significant improvement in FEV1 and FVC per ATS criteria. However results did not normalize.   Allergy Studies: none       Salvatore Marvel, MD  Allergy and [place] [facility] [place]

## 2018-12-23 NOTE — Patient Instructions (Addendum)
1. Asthma-COPD overlap syndrome - Lung testing looked terrible today, but it did improve with the nebulizer treatments. - Start the steroid pack provided.  - I will get some labs to look for serious causes of asthma: alpha-1 anti-tryptsin level and Aspergillus precipitins - We will call you in 1-2 weeks with the results of the testing.  - We are going to get a chest X-ray to make sure that you do not have pneumonia. - We are going to add on Spiriva one puff once daily to help keep your lungs open.  - I think another visit with Dr. Lamonte Sakai could be useful.  - Daily controller medication(s): Symbicort 160/4.59mcg two puffs twice daily with spacer + Spiriva 2.56mcg one puff once daily + Xolair every two weeks - Prior to physical activity: ProAir 2 puffs 10-15 minutes before physical activity. - Rescue medications: ProAir 4 puffs every 4-6 hours as needed or albuterol nebulizer one vial puffs every 4-6 hours as needed - Asthma control goals:  * Full participation in all desired activities (may need albuterol before activity) * Albuterol use two time or less a week on average (not counting use with activity) * Cough interfering with sleep two time or less a month * Oral steroids no more than once a year * No hospitalizations  2. Chronic urticaria - Continue with Xolair every two weeks. - Add on Zyrtec (cetrizine) 1-2 tablets up to twice daily until the itching is improved, then wean as tolerated.   3. Return in about 3 months (around 03/24/2019).    Please inform us of any Emergency Department visits, hospitalizations, or changes in symptoms. Call us before going to the ED for breathing or allergy symptoms since we might be able to fit you in for a sick visit. Feel free to contact us anytime with any questions, problems, or concerns.  It was a pleasure to see you again today!   Websites that have reliable patient information: 1. American Academy of Asthma, Allergy, and Immunology:  www.aaaai.org 2. Food Allergy Research and Education (FARE): foodallergy.org 3. Mothers of Asthmatics: http://www.asthmacommunitynetwork.org 4. American College of Allergy, Asthma, and Immunology: www.acaai.org

## 2018-12-26 LAB — IGE: IgE (Immunoglobulin E), Serum: 90 IU/mL (ref 6–495)

## 2018-12-26 LAB — ALPHA-1-ANTITRYPSIN: A-1 Antitrypsin: 144 mg/dL (ref 101–187)

## 2018-12-26 LAB — ASPERGILLUS PRECIPITINS
A.Fumigatus #1 Abs: NEGATIVE
Aspergillus Flavus Antibodies: NEGATIVE
Aspergillus Niger Antibodies: NEGATIVE

## 2018-12-30 ENCOUNTER — Ambulatory Visit (INDEPENDENT_AMBULATORY_CARE_PROVIDER_SITE_OTHER): Payer: Medicare Other | Admitting: *Deleted

## 2018-12-30 ENCOUNTER — Ambulatory Visit: Payer: Self-pay | Admitting: Internal Medicine

## 2018-12-30 DIAGNOSIS — L501 Idiopathic urticaria: Secondary | ICD-10-CM | POA: Diagnosis not present

## 2018-12-30 MED ORDER — BUDESONIDE-FORMOTEROL FUMARATE 160-4.5 MCG/ACT IN AERO: 2.0000 | INHALATION_SPRAY | Freq: Two times a day (BID) | RESPIRATORY_TRACT | 3 refills | Status: DC

## 2019-01-03 ENCOUNTER — Encounter: Payer: Self-pay | Admitting: Emergency Medicine

## 2019-01-03 ENCOUNTER — Ambulatory Visit
Admission: EM | Admit: 2019-01-03 | Discharge: 2019-01-03 | Disposition: A | Discharge status: Home / Self Care | Payer: Medicare Other | Attending: Emergency Medicine | Admitting: Emergency Medicine

## 2019-01-03 DIAGNOSIS — J441 Chronic obstructive pulmonary disease with (acute) exacerbation: Secondary | ICD-10-CM | POA: Diagnosis not present

## 2019-01-03 MED ORDER — METHYLPREDNISOLONE SODIUM SUCC 125 MG IJ SOLR
125.0000 mg | Freq: Once | INTRAMUSCULAR | Status: AC
  Administered 2019-01-03: 125 mg via INTRAMUSCULAR

## 2019-01-03 MED ORDER — CETIRIZINE HCL 10 MG PO CAPS: 10.0000 mg | ORAL_CAPSULE | Freq: Every day | ORAL | 0 refills | Status: DC

## 2019-01-03 MED ORDER — IPRATROPIUM-ALBUTEROL 0.5-2.5 (3) MG/3ML IN SOLN
3.0000 mL | Freq: Once | RESPIRATORY_TRACT | Status: AC
  Administered 2019-01-03: 3 mL via RESPIRATORY_TRACT

## 2019-01-03 MED ORDER — IPRATROPIUM-ALBUTEROL 0.5-2.5 (3) MG/3ML IN SOLN: 3.0000 mL | RESPIRATORY_TRACT | 0 refills | Status: DC | PRN

## 2019-01-03 MED ORDER — BENZONATATE 200 MG PO CAPS: 200.0000 mg | ORAL_CAPSULE | Freq: Three times a day (TID) | ORAL | 0 refills | Status: AC | PRN

## 2019-01-03 MED ORDER — PREDNISONE 50 MG PO TABS: 50.0000 mg | ORAL_TABLET | Freq: Every day | ORAL | 0 refills | Status: DC

## 2019-01-03 NOTE — ED Triage Notes (Signed)
Pt presents to [facility] for assessment of asthma exacerbation.  Patient not able to speak in full sentences.  Patient audibly wheezing in room

## 2019-01-03 NOTE — Discharge Instructions (Signed)
We gave you a shot of Solu-Medrol today to help with the inflammation in your chest Please begin prednisone daily tomorrow Continue to use inhalers as needed, may use duo nebs with your nebulizer at home as needed for shortness of breath and wheezing Please begin daily cetirizine to help with congestion and drainage Tessalon for cough  Please follow-up if developing fever, persistent shortness of breath, difficulty breathing, chest discomfort

## 2019-01-03 NOTE — ED Notes (Signed)
Patient able to ambulate independently  

## 2019-01-03 NOTE — ED Provider Notes (Signed)
[facility]    CSN: [id number] Arrival date & time: 01/03/19  1356     History   Chief Complaint Chief Complaint  Patient presents with  . Shortness of Breath  . Asthma    HPI [name] is a 58 y.o. female history of asthma, COPD, hyperlipidemia, fibromyalgia, presenting today for evaluation of shortness of breath and wheezing.  Patient states that yesterday she started to develop some nasal congestion and sore throat.  Since she has had worsening asthma wheezing and difficulty breathing.  She has felt a tightness in her chest.  Denies any fevers.  Cough is been productive.  She has used albuterol inhaler without relief.  HPI  Past Medical History:  Diagnosis Date  . ALLERGIC RHINITIS 05/12/2009   Qualifier: Diagnosis of  By: Lenn Cal Deborra Medina), Wynona Canes    . Anemia, iron deficiency 12/22/2014  . Angina   . Anxiety   . Arthritis   . Asthma   . ASTHMA 05/12/2009   Severe AFL (Spirometry 05/2009: pre-BD FEV1 0.87L 34% pred, post-BD FEV1 1.11L 44% pred) Volumes hyperinflated Decreased DLCO that does not fully correct to normal range for alveolar volume.     Marland Kitchen COPD 08/24/2009   Qualifier: Diagnosis of  By: Burnett Kanaris    . COPD (chronic obstructive pulmonary disease) ([place])   . Depression   . Fibromyalgia 05/14/2014  . GERD (gastroesophageal reflux disease)   . Hyperlipidemia 04/20/2017  . HYPERTENSION 05/12/2009   Qualifier: Diagnosis of  By: Lenn Cal Deborra Medina), Wynona Canes    . Migraine   . Nephrolithiasis   . Peripheral vascular disease ([place])   . Prediabetes 02/23/2014  . Seizure ([place])   . Seizure ([place])   . Urticaria     Patient Active Problem List   Diagnosis Date Noted  . Vertigo 09/23/2018  . Dysfunction of left eustachian tube 09/23/2018  . Gait disorder 04/21/2018  . Leukocytosis 04/15/2018  . Nausea & vomiting 04/15/2018  . Seizure ([place])   . Cervical radiculopathy 02/18/2018  . 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 Syncope 09/04/2017  .  Constipation 09/04/2017  . Nonintractable headache 09/04/2017  . Leg swelling 07/26/2017  . 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 Hyperlipidemia 04/20/2017  . Pedal edema 04/19/2017  . Bipolar I disorder ([place]) 09/19/2016  . Facial pain 07/12/2016  . Rash 06/14/2016  . Migraine 01/25/2016  . Idiopathic urticaria/pruritus 01/23/2016  . Perennial allergic rhinitis 01/23/2016  . Oral candidiasis 01/23/2016  . Greater trochanteric bursitis of both hips 09/08/2015  . Chest pain 06/22/2015  . Itching 06/22/2015  . Bilateral knee pain 06/22/2015  . Bilateral hip pain 06/22/2015  . Anemia, iron deficiency 12/22/2014  . Fatigue 12/21/2014  . Intertrigo 08/07/2014  . Dyspareunia 07/12/2014  . Menopausal syndrome (hot flushes) 07/12/2014  . History of tobacco abuse 07/12/2014  . Dizziness 06/22/2014  . Weakness 06/22/2014  . Fibromyalgia 05/14/2014  . Routine general medical examination at a health care facility 04/22/2014  . Recurrent boils 04/22/2014  . Recurrent falls 04/22/2014  . Peripheral vascular disease ([place])   . Depression   . Anxiety   . GERD (gastroesophageal reflux disease)   . Prediabetes 02/23/2014  . Screening mammogram for high-risk patient 02/23/2014  . Back pain 07/22/2013  . COPD/asthma 08/24/2009  . Headache(784.0) 08/24/2009  . HTN (hypertension) 05/12/2009  . Allergic rhinitis 05/12/2009  . Asthma 05/12/2009    Past Surgical History:  Procedure Laterality Date  . ABDOMINAL HYSTERECTOMY    . BACK SURGERY    .  COLONOSCOPY  12/20/2011   Procedure: COLONOSCOPY;  Surgeon: Landry Dyke, MD;  Location: [facility];  Service: Endoscopy;  Laterality: N/A;  . COLONOSCOPY  03/05/2012   Procedure: COLONOSCOPY;  Surgeon: Landry Dyke, MD;  Location: [facility];  Service: Endoscopy;  Laterality: N/A;  . DIAGNOSTIC LAPAROSCOPY    . HEMORRHOID SURGERY    . INCISE AND DRAIN ABCESS    . KIDNEY STONE SURGERY    . NECK SURGERY    . TOE SURGERY    .  TUBAL LIGATION      OB History    Gravida  7   Para  3   Term  3   Preterm      AB  4   Living  3     SAB  3   TAB  1   Ectopic      Multiple      Live Births               Home Medications    Prior to Admission medications   Medication Sig Start Date End Date Taking? Authorizing Provider  albuterol (PROAIR HFA) 108 (90 Base) MCG/ACT inhaler Inhale 2 puffs into the lungs every 4 (four) hours as needed for wheezing or shortness of breath. 12/15/18  Yes Bobbitt, Sedalia Muta, MD  budesonide-formoterol [facility]) 160-4.5 MCG/ACT inhaler Inhale 2 puffs into the lungs 2 (two) times daily. 12/09/18  Yes Valentina Shaggy, MD  budesonide-formoterol [name]) 160-4.5 MCG/ACT inhaler Inhale 2 puffs into the lungs 2 (two) times daily. 12/30/18  Yes Valentina Shaggy, MD  carbamazepine (TEGRETOL) 200 MG tablet 1  qam   2   qhs 12/19/18  Yes Plovsky, Berneta Sages, MD  fluticasone (FLONASE) 50 MCG/ACT nasal spray Place 2 sprays into both nostrils daily. 07/09/18  Yes Tanda Rockers, MD  hydrochlorothiazide (MICROZIDE) 12.5 MG capsule TAKE 1 CAPSULE(12.5 MG) BY MOUTH DAILY 07/08/18  Yes Biagio Borg, MD  levocetirizine (XYZAL) 5 MG tablet TAKE 1 TABLET BY MOUTH EVERY DAY AS NEEDED FOR ALLERGIES 12/18/18  Yes Bobbitt, Sedalia Muta, MD  meclizine (ANTIVERT) 12.5 MG tablet Take 1 tablet (12.5 mg total) by mouth 3 (three) times daily as needed for dizziness. 09/23/18 09/23/19 Yes Biagio Borg, MD  montelukast (SINGULAIR) 10 MG tablet Take 1 tablet (10 mg total) by mouth at bedtime. 08/01/18  Yes Padgett, Rae Halsted, MD  nicotine (NICODERM CQ - DOSED IN MG/24 HOURS) 14 mg/24hr patch Place 14 mg onto the skin daily.   Yes [provider]  Omalizumab Arvid Right [name]) Inject 1 Applicatorful into the skin every 30 (thirty) days.   Yes [provider]  pantoprazole (PROTONIX) 40 MG tablet TAKE 1 TABLET BY MOUTH 30 TO 60 MINUTES BEFORE FIRST MEAL OF THE DAY AS DIRECTED 07/28/18   Yes Tanda Rockers, MD  pravastatin (PRAVACHOL) 40 MG tablet TAKE 1 TABLET(40 MG) BY MOUTH DAILY 07/08/18  Yes Biagio Borg, MD  ranitidine (ZANTAC) 150 MG tablet Take 1 tablet (150 mg total) by mouth 2 (two) times daily as needed for heartburn. 08/26/17  Yes Collene Gobble, MD  triamcinolone cream (KENALOG) 0.1 % APPLY EXTERNALLY TO THE AFFECTED AREA TWICE DAILY 12/18/18  Yes Biagio Borg, MD  albuterol (PROVENTIL HFA;VENTOLIN HFA) 108 (90 Base) MCG/ACT inhaler INHALE 1 TO 2 PUFFS BY MOUTH EVERY 4 TO 6 HOURS AS NEEDED Patient taking differently: INHALE 1 TO 2 PUFFS BY MOUTH EVERY 4 TO 6 HOURS AS NEEDED FOR SHORTNESS  OF BREATH 03/10/18   Collene Gobble, MD  albuterol (PROVENTIL HFA;VENTOLIN HFA) 108 (90 Base) MCG/ACT inhaler Inhale 2 puffs into the lungs every 4 (four) hours as needed for wheezing or shortness of breath. 12/05/18   Bobbitt, Sedalia Muta, MD  albuterol (PROVENTIL) (2.5 MG/3ML) 0.083% nebulizer solution Take 3 mLs (2.5 mg total) by nebulization every 4 (four) hours as needed for wheezing or shortness of breath. Reported on 06/04/2016 12/23/18   Valentina Shaggy, MD  benzonatate (TESSALON) 200 MG capsule Take 1 capsule (200 mg total) by mouth 3 (three) times daily as needed for up to 7 days for cough. 01/03/19 01/10/19  Laquinda Moller C, PA-C  Cetirizine HCl 10 MG CAPS Take 1 capsule (10 mg total) by mouth daily for 10 days. 01/03/19 01/13/19  Deborha Moseley C, PA-C  doxycycline (VIBRA-TABS) 100 MG tablet Take 1 tablet (100 mg total) by mouth 2 (two) times daily. Patient not taking: Reported on 12/23/2018 10/14/18   Marrian Salvage, FNP  guaiFENesin (TUSSIN PO) Take by mouth.    [provider]  ipratropium-albuterol (DUONEB) 0.5-2.5 (3) MG/3ML SOLN Take 3 mLs by nebulization every 4 (four) hours as needed. 01/03/19   Christian Borgerding C, PA-C  irbesartan-hydrochlorothiazide (AVALIDE) 150-12.5 MG tablet TAKE 1 TABLET BY MOUTH EVERY DAY 12/18/18   Biagio Borg, MD  predniSONE  (DELTASONE) 50 MG tablet Take 1 tablet (50 mg total) by mouth daily with breakfast for 5 days. 01/03/19 01/08/19  Ladawn Boullion C, PA-C  tiZANidine (ZANAFLEX) 4 MG tablet TAKE 1 TABLET(4 MG) BY MOUTH EVERY 6 HOURS AS NEEDED FOR MUSCLE SPASMS 07/23/17   Biagio Borg, MD    Family History Family History  Problem Relation Age of Onset  . Diabetes Mother   . COPD Mother   . Heart disease Mother   . Asthma Mother   . Diabetes Father   . Kidney disease Father   . Cancer Father   . Anesthesia problems Father   . Alcohol abuse Father   . Diabetes Sister   . Hypertension Sister   . Diabetes Brother   . Sleep apnea Brother   . Asthma Brother   . Alcohol abuse Brother   . Heart disease Sister   . Diabetes Sister   . Brain cancer Sister   . Heart disease Brother   . Asthma Sister   . Allergic rhinitis Neg Hx   . Eczema Neg Hx   . Urticaria Neg Hx     Social History Social History   Tobacco Use  . Smoking status: Current Some Day Smoker    Packs/day: 0.25    Years: 36.00    Pack years: 9.00    Types: Cigarettes    Last attempt to quit: 05/09/2018    Years since quitting: 0.6  . Smokeless tobacco: Never Used  . Tobacco comment: Passive smoker.  Patient states her quit date was 05/09/2018.  Patient educated with resources at today's appointment to continue to support her to stop smoking.  Substance Use Topics  . Alcohol use: Not Currently    Alcohol/week: 0.0 standard drinks  . Drug use: Yes    Types: Marijuana    Comment: TWICE A MONTH      Allergies   Codeine; Sulfa antibiotics; and Sulfonamide derivatives   Review of Systems Review of Systems  Constitutional: Negative for activity change, appetite change, chills, fatigue and fever.  HENT: Positive for congestion, rhinorrhea and sore throat. Negative for ear pain, sinus pressure  and trouble swallowing.   Eyes: Negative for discharge and redness.  Respiratory: Positive for cough, shortness of breath and wheezing.  Negative for chest tightness.   Cardiovascular: Negative for chest pain.  Gastrointestinal: Negative for abdominal pain, diarrhea, nausea and vomiting.  Musculoskeletal: Negative for myalgias.  Skin: Negative for rash.  Neurological: Negative for dizziness, light-headedness and headaches.     Physical Exam Triage Vital Signs ED Triage Vitals [01/03/19 1403]  Enc Vitals Group     BP 133/80     Pulse Rate 79     Resp (!) 24     Temp (!) 97.5 F (36.4 C)     Temp Source Oral     SpO2 98 %     Weight      Height      Head Circumference      Peak Flow      Pain Score 3     Pain Loc      Pain Edu?      Excl. in [place]?    No data found.  Updated Vital Signs BP 133/80 (BP Location: Left Arm)   Pulse 79   Temp (!) 97.5 F (36.4 C) (Oral)   Resp (!) 24   SpO2 98%   Visual Acuity Right Eye Distance:   Left Eye Distance:   Bilateral Distance:    Right Eye Near:   Left Eye Near:    Bilateral Near:     Physical Exam Vitals signs and nursing note reviewed.  Constitutional:      General: She is not in acute distress.    Appearance: She is well-developed.  HENT:     Head: Normocephalic and atraumatic.     Ears:     Comments: Bilateral ears without tenderness to palpation of external auricle, tragus and mastoid, EAC's without erythema or swelling, TM's with good bony landmarks and cone of light. Non erythematous.    Nose:     Comments: Nasal mucosa slightly erythematous, rhinorrhea present    Mouth/Throat:     Comments: Oral mucosa pink and moist, no tonsillar enlargement or exudate. Posterior pharynx patent and nonerythematous, no uvula deviation or swelling. Normal phonation.  Eyes:     Conjunctiva/sclera: Conjunctivae normal.  Neck:     Musculoskeletal: Neck supple.  Cardiovascular:     Rate and Rhythm: Normal rate and regular rhythm.     Heart sounds: No murmur.  Pulmonary:     Effort: Pulmonary effort is normal. No respiratory distress.     Breath sounds:  Wheezing present.     Comments: Patient is slightly tachypneic on arrival, no accessory muscle use but audible wheezing, inspiratory and expiratory wheezing throughout bilateral lung fields, significantly improved after breathing treatment and patient no longer tachypneic; no rales or other adventitious sounds auscultated after DuoNeb Abdominal:     Palpations: Abdomen is soft.     Tenderness: There is no abdominal tenderness.  Skin:    General: Skin is warm and dry.  Neurological:     Mental Status: She is alert.      UC Treatments / Results  Labs (all labs ordered are listed, but only abnormal results are displayed) Labs Reviewed - No data to display  EKG None  Radiology No results found.  Procedures Procedures (including critical care time)  Medications Ordered in UC Medications  ipratropium-albuterol (DUONEB) 0.5-2.5 (3) MG/3ML nebulizer solution 3 mL (3 mLs Nebulization Given 01/03/19 1422)  ipratropium-albuterol (DUONEB) 0.5-2.5 (3) MG/3ML nebulizer solution 3 mL (3  mLs Nebulization Given 01/03/19 1453)  methylPREDNISolone sodium succinate (SOLU-MEDROL) 125 mg/2 mL injection 125 mg (125 mg Intramuscular Given 01/03/19 1453)    Initial Impression / Assessment and Plan / UC Course  I have reviewed the triage vital signs and the nursing notes.  Pertinent labs & imaging results that were available during my care of the patient were reviewed by me and considered in my medical decision making (see chart for details).     Patient likely with viral URI exacerbating asthma/COPD.  2 duo nebs provided in clinic, Solu-Medrol IM prior to discharge.  Begin prednisone tomorrow.  Continue to use inhalers.  Prescribed duo nebs for use at home.  Zyrtec and Tessalon for congestion and cough.Discussed strict return precautions. Patient verbalized understanding and is agreeable with plan.  Final Clinical Impressions(s) / UC Diagnoses   Final diagnoses:  COPD exacerbation ([place])      Discharge Instructions     We gave you a shot of Solu-Medrol today to help with the inflammation in your chest Please begin prednisone daily tomorrow Continue to use inhalers as needed, may use duo nebs with your nebulizer at home as needed for shortness of breath and wheezing Please begin daily cetirizine to help with congestion and drainage Tessalon for cough  Please follow-up if developing fever, persistent shortness of breath, difficulty breathing, chest discomfort   ED Prescriptions    Medication Sig Dispense Auth. Provider   predniSONE (DELTASONE) 50 MG tablet Take 1 tablet (50 mg total) by mouth daily with breakfast for 5 days. 5 tablet Trino Higinbotham C, PA-C   benzonatate (TESSALON) 200 MG capsule Take 1 capsule (200 mg total) by mouth 3 (three) times daily as needed for up to 7 days for cough. 28 capsule Kendelle Schweers C, PA-C   Cetirizine HCl 10 MG CAPS Take 1 capsule (10 mg total) by mouth daily for 10 days. 10 capsule Adonias Demore C, PA-C   ipratropium-albuterol (DUONEB) 0.5-2.5 (3) MG/3ML SOLN Take 3 mLs by nebulization every 4 (four) hours as needed. 360 mL Inaara Tye C, PA-C     Controlled Substance Prescriptions [place] Controlled Substance Registry consulted? Not Applicable   Janith Lima, [place] 01/03/19 1653

## 2019-01-05 NOTE — Addendum Note (Signed)
Addended by: Carin Hock on: 01/05/2019 02:02 PM   Modules accepted: Orders

## 2019-01-06 ENCOUNTER — Ambulatory Visit (INDEPENDENT_AMBULATORY_CARE_PROVIDER_SITE_OTHER)
Admission: RE | Admit: 2019-01-06 | Discharge: 2019-01-06 | Disposition: A | Discharge status: Home / Self Care | Payer: Medicare Other | Source: Ambulatory Visit | Attending: Nurse Practitioner | Admitting: Nurse Practitioner

## 2019-01-06 ENCOUNTER — Encounter: Payer: Self-pay | Admitting: Nurse Practitioner

## 2019-01-06 ENCOUNTER — Ambulatory Visit (INDEPENDENT_AMBULATORY_CARE_PROVIDER_SITE_OTHER): Payer: Medicare Other | Admitting: Nurse Practitioner

## 2019-01-06 VITALS — BP 112/66 | HR 67 | Ht 63.0 in | Wt 182.0 lb

## 2019-01-06 DIAGNOSIS — J441 Chronic obstructive pulmonary disease with (acute) exacerbation: Secondary | ICD-10-CM | POA: Diagnosis not present

## 2019-01-06 DIAGNOSIS — R05 Cough: Secondary | ICD-10-CM | POA: Diagnosis not present

## 2019-01-06 DIAGNOSIS — J45901 Unspecified asthma with (acute) exacerbation: Secondary | ICD-10-CM

## 2019-01-06 DIAGNOSIS — R0602 Shortness of breath: Secondary | ICD-10-CM

## 2019-01-06 DIAGNOSIS — J329 Chronic sinusitis, unspecified: Secondary | ICD-10-CM | POA: Diagnosis not present

## 2019-01-06 MED ORDER — LEVALBUTEROL HCL 0.63 MG/3ML IN NEBU
0.6300 mg | INHALATION_SOLUTION | Freq: Once | RESPIRATORY_TRACT | Status: AC
  Administered 2019-01-06: 0.63 mg via RESPIRATORY_TRACT

## 2019-01-06 MED ORDER — DOXYCYCLINE HYCLATE 100 MG PO TABS: 100.0000 mg | ORAL_TABLET | Freq: Two times a day (BID) | ORAL | 0 refills | Status: DC

## 2019-01-06 NOTE — Progress Notes (Addendum)
@Patient  ID: [name], female    DOB: [birth year], 58 y.o.   MRN: [id number]  Chief Complaint  Patient presents with  . Shortness of Breath    Seen in ED 01/03/19 for COPD Exacerbation    Referring provider: Biagio Borg, MD  HPI 58 year old female current some day smoker with asthma/COPD overlap syndrome and perennial allergic rhinitis who is followed by Dr. Lamonte Sakai.  Patient is also followed by Dr. Ernst Bowler allergy specialist -patient has been on Xolair and recently switched to [place].  She has not taken her first dose of Dupixent yet.  Tests/recent events:  CXR 12/23/18>> no active cardiopulmonary disease  PFT Results Latest Ref Rng & Units 05/02/2018  FVC-Pre L 1.92  FVC-Predicted Pre % 75  Pre FEV1/FVC % % 55  FEV1-Pre L 1.06  FEV1-Predicted Pre % 52   levalbuterol (XOPENEX) nebulizer solution 0.63 mg    Date Action Dose Route User   01/06/2019 0929 Given 0.63 mg Nebulization Marylyn Ishihara M, CMA    methylPREDNISolone acetate (DEPO-MEDROL) injection 80 mg    Date Action Dose Route User   Discharged on 01/03/2019   Admitted on 01/03/2019   12/09/2018 1046 Given 80 mg Intramuscular (Left Ventrogluteal) Valere Dross, CMA    omalizumab Arvid Right) injection 300 mg    Date Action Dose Route User   Discharged on 01/03/2019   Admitted on 01/03/2019   12/30/2018 1624 Given 300 mg Subcutaneous (Other) Horris Latino, CMA   12/12/2018 1040 Given 300 mg Subcutaneous (Other) Orlene Erm, LPN   00/93/8182 9937 Given 300 mg Subcutaneous (Other) Horris Latino, [place]   11/11/2018 1541 Given 300 mg Subcutaneous (Right Arm) Carin Hock, CMA   11/11/2018 0919 Given 300 mg Subcutaneous (Other) [place], Ronnette Juniper, CMA     OV 01/06/19 - cough/wheezing/chest congestion Patient presents today with some shortness of breath, wheezing, and sinus congestion.  She states that symptoms started around 12/31/2018.  She was seen in urgent care on 01/03/2019.  She was given a  prednisone taper.  She states that symptoms have not improved.  States that she is compliant with her Symbicort and Spiriva.  She has been using her albuterol every 4 hours.  States last time she used it was last night.  She has been on Xolair through her allergist but recent note states that she will be switching to [place].  She has not started Dupixent yet.  Patient states that she has quit smoking recently.  Also states that she was burning a lot of candles in her home and she has stopped burning the candles also.  She denies any recent fever.  She does complain of sinus congestion pressure and pain.  States that her cough is nonproductive.  She denies any chest pain or edema.    Allergies  Allergen Reactions  . Codeine Nausea And Vomiting  . Sulfa Antibiotics Nausea And Vomiting  . Sulfonamide Derivatives Nausea And Vomiting    REACTION: n/v    Immunization History  Administered Date(s) Administered  . Influenza Split 10/23/2011  . Influenza Whole 09/16/2012  . Influenza,inj,Quad PF,6+ Mos 10/18/2014, 08/31/2015, 09/25/2016, 10/09/2017, 09/23/2018  . Pneumococcal Polysaccharide-23 12/02/2015  . Td 04/22/2014    Past Medical History:  Diagnosis Date  . ALLERGIC RHINITIS 05/12/2009   Qualifier: Diagnosis of  By: Lenn Cal Deborra Medina), Wynona Canes    . Anemia, iron deficiency 12/22/2014  . Angina   . Anxiety   . Arthritis   . Asthma   . ASTHMA  05/12/2009   Severe AFL (Spirometry 05/2009: pre-BD FEV1 0.87L 34% pred, post-BD FEV1 1.11L 44% pred) Volumes hyperinflated Decreased DLCO that does not fully correct to normal range for alveolar volume.     Marland Kitchen COPD 08/24/2009   Qualifier: Diagnosis of  By: Burnett Kanaris    . COPD (chronic obstructive pulmonary disease) ([place])   . Depression   . Fibromyalgia 05/14/2014  . GERD (gastroesophageal reflux disease)   . Hyperlipidemia 04/20/2017  . HYPERTENSION 05/12/2009   Qualifier: Diagnosis of  By: Lenn Cal Deborra Medina), Wynona Canes    . Migraine   .  Nephrolithiasis   . Peripheral vascular disease ([place])   . Prediabetes 02/23/2014  . Seizure ([place])   . Seizure ([place])   . Urticaria     Tobacco History: Social History   Tobacco Use  Smoking Status Current Some Day Smoker  . Packs/day: 0.25  . Years: 36.00  . Pack years: 9.00  . Types: Cigarettes  . Last attempt to quit: 05/09/2018  . Years since quitting: 0.6  Smokeless Tobacco Never Used  Tobacco Comment   Passive smoker.  Patient states her quit date was 05/09/2018.  Patient educated with resources at today's appointment to continue to support her to stop smoking.   Ready to quit: Yes Counseling given: Yes Comment: Passive smoker.  Patient states her quit date was 05/09/2018.  Patient educated with resources at today's appointment to continue to support her to stop smoking.   Outpatient Encounter Medications as of 01/06/2019  Medication Sig  . albuterol (PROAIR HFA) 108 (90 Base) MCG/ACT inhaler Inhale 2 puffs into the lungs every 4 (four) hours as needed for wheezing or shortness of breath.  Marland Kitchen albuterol (PROVENTIL HFA;VENTOLIN HFA) 108 (90 Base) MCG/ACT inhaler Inhale 2 puffs into the lungs every 4 (four) hours as needed for wheezing or shortness of breath.  Marland Kitchen albuterol (PROVENTIL) (2.5 MG/3ML) 0.083% nebulizer solution Take 3 mLs (2.5 mg total) by nebulization every 4 (four) hours as needed for wheezing or shortness of breath. Reported on 06/04/2016  . benzonatate (TESSALON) 200 MG capsule Take 1 capsule (200 mg total) by mouth 3 (three) times daily as needed for up to 7 days for cough.  . budesonide-formoterol (SYMBICORT) 160-4.5 MCG/ACT inhaler Inhale 2 puffs into the lungs 2 (two) times daily.  . carbamazepine (TEGRETOL) 200 MG tablet 1  qam   2   qhs  . Cetirizine HCl 10 MG CAPS Take 1 capsule (10 mg total) by mouth daily for 10 days.  . DUPIXENT 300 MG/2ML SOSY   . fluticasone (FLONASE) 50 MCG/ACT nasal spray Place 2 sprays into both nostrils daily.  Marland Kitchen guaiFENesin (TUSSIN PO)  Take by mouth.  . hydrochlorothiazide (MICROZIDE) 12.5 MG capsule TAKE 1 CAPSULE(12.5 MG) BY MOUTH DAILY  . ipratropium-albuterol (DUONEB) 0.5-2.5 (3) MG/3ML SOLN Take 3 mLs by nebulization every 4 (four) hours as needed.  . irbesartan-hydrochlorothiazide (AVALIDE) 150-12.5 MG tablet TAKE 1 TABLET BY MOUTH EVERY DAY  . levocetirizine (XYZAL) 5 MG tablet TAKE 1 TABLET BY MOUTH EVERY DAY AS NEEDED FOR ALLERGIES  . meclizine (ANTIVERT) 12.5 MG tablet Take 1 tablet (12.5 mg total) by mouth 3 (three) times daily as needed for dizziness.  . montelukast (SINGULAIR) 10 MG tablet Take 1 tablet (10 mg total) by mouth at bedtime.  . nicotine (NICODERM CQ - DOSED IN MG/24 HOURS) 14 mg/24hr patch Place 14 mg onto the skin daily.  . pantoprazole (PROTONIX) 40 MG tablet TAKE 1 TABLET BY MOUTH 30 TO 60 MINUTES  BEFORE FIRST MEAL OF THE DAY AS DIRECTED  . pravastatin (PRAVACHOL) 40 MG tablet TAKE 1 TABLET(40 MG) BY MOUTH DAILY  . predniSONE (DELTASONE) 50 MG tablet Take 1 tablet (50 mg total) by mouth daily with breakfast for 5 days.  . ranitidine (ZANTAC) 150 MG tablet Take 1 tablet (150 mg total) by mouth 2 (two) times daily as needed for heartburn.  Marland Kitchen tiZANidine (ZANAFLEX) 4 MG tablet TAKE 1 TABLET(4 MG) BY MOUTH EVERY 6 HOURS AS NEEDED FOR MUSCLE SPASMS  . triamcinolone cream (KENALOG) 0.1 % APPLY EXTERNALLY TO THE AFFECTED AREA TWICE DAILY  . doxycycline (VIBRA-TABS) 100 MG tablet Take 1 tablet (100 mg total) by mouth 2 (two) times daily.  . [DISCONTINUED] albuterol (PROVENTIL HFA;VENTOLIN HFA) 108 (90 Base) MCG/ACT inhaler INHALE 1 TO 2 PUFFS BY MOUTH EVERY 4 TO 6 HOURS AS NEEDED (Patient not taking: Reported on 01/06/2019)  . [DISCONTINUED] budesonide-formoterol (SYMBICORT) 160-4.5 MCG/ACT inhaler Inhale 2 puffs into the lungs 2 (two) times daily. (Patient not taking: Reported on 01/06/2019)  . [DISCONTINUED] doxycycline (VIBRA-TABS) 100 MG tablet Take 1 tablet (100 mg total) by mouth 2 (two) times daily.  (Patient not taking: Reported on 01/06/2019)   Facility-Administered Encounter Medications as of 01/06/2019  Medication  . [COMPLETED] levalbuterol (XOPENEX) nebulizer solution 0.63 mg  . omalizumab Arvid Right) injection 300 mg     Review of Systems  Review of Systems  Constitutional: Negative.  Negative for chills and fever.  HENT: Positive for postnasal drip, sinus pressure and sinus pain.   Respiratory: Positive for cough, shortness of breath and wheezing.   Cardiovascular: Negative.  Negative for chest pain, palpitations and leg swelling.  Gastrointestinal: Negative.   Allergic/Immunologic: Negative.   Neurological: Negative.   Psychiatric/Behavioral: Negative.        Physical Exam  BP 112/66 (BP Location: Left Arm, Patient Position: Sitting, Cuff Size: Normal)   Pulse 67   Ht 5\' 3"  (1.6 m)   Wt 182 lb (82.6 kg)   SpO2 96%   BMI 32.24 kg/m   Wt Readings from Last 5 Encounters:  01/06/19 182 lb (82.6 kg)  10/14/18 185 lb 0.6 oz (83.9 kg)  10/03/18 184 lb (83.5 kg)  09/23/18 181 lb (82.1 kg)  07/15/18 170 lb (77.1 kg)     Physical Exam Vitals signs and nursing note reviewed.  Constitutional:      General: She is not in acute distress.    Appearance: She is well-developed.  Cardiovascular:     Rate and Rhythm: Normal rate and regular rhythm.  Pulmonary:     Effort: Pulmonary effort is normal.     Breath sounds: Examination of the right-upper field reveals wheezing. Examination of the left-upper field reveals wheezing. Examination of the right-middle field reveals wheezing. Examination of the left-middle field reveals wheezing. Examination of the right-lower field reveals wheezing. Examination of the left-lower field reveals wheezing. Wheezing present.  Musculoskeletal:     Right lower leg: No edema.  Neurological:     Mental Status: She is alert and oriented to person, place, and time.      Imaging: Dg Chest 2 View  Result Date: 01/06/2019 CLINICAL DATA:   Shortness of breath, wheezing, productive cough EXAM: CHEST - 2 VIEW COMPARISON:  12/23/2018 FINDINGS: Mild retrocardiac/left lower lobe opacity. Mild right basilar opacity. No pleural effusion or pneumothorax. The heart is normal in size. Cervical spine fixation hardware. IMPRESSION: Mild retrocardiac/left lower lobe opacity, atelectasis versus pneumonia. Mild right basilar opacity, likely atelectasis. Electronically Signed  By: Julian Hy M.D.   On: 01/06/2019 10:16   Dg Chest 2 View  Result Date: 12/23/2018 CLINICAL DATA:  Productive cough for 1 month.  Asthma.  COPD. EXAM: CHEST - 2 VIEW COMPARISON:  10/05/2018 FINDINGS: The heart size and mediastinal contours are within normal limits. Aortic atherosclerosis. Both lungs are clear. Cervical spine fusion hardware again noted. IMPRESSION: No active cardiopulmonary disease. Electronically Signed   By: Earle Gell M.D.   On: 12/23/2018 15:23     Assessment & Plan:   Asthma exacerbation with COPD (chronic obstructive pulmonary disease) ([place]) Patient Instructions  Asthma exacerbation slow to resolve/underlying sinusitis: Will check chest x ray in office today and call with results Xopenex neb given in office today Will order doxycycline  Please complete prednisone taper that was prescribed by urgent care May take mucinex DM twice daily May take zyrtec  Daily controller medications: Continue Symbicort 160/4.5 mcg 2 puffs twice daily with spacer Spiriva 2.5 mcg 1 puff once daily Continue Xolair every 2 weeks - will be switching to dupixent per allergy note - hopefully this will help with better control  Rescue medications: Proventil 4 puffs every 4-6 hours as needed or albuterol nebulizer one vial every 4-6 hours as needed  Please follow up in 2 weeks with me or sooner if needed    Sinusitis Patient Instructions  Asthma exacerbation slow to resolve/underlying sinusitis: Will check chest x ray in office today and call with  results Xopenex neb given in office today Will order doxycycline  Please complete prednisone taper that was prescribed by urgent care May take mucinex DM twice daily May take zyrtec  Daily controller medications: Continue Symbicort 160/4.5 mcg 2 puffs twice daily with spacer Spiriva 2.5 mcg 1 puff once daily Continue Xolair every 2 weeks - will be switching to dupixent per allergy note - hopefully this will help with better control  Rescue medications: Proventil 4 puffs every 4-6 hours as needed or albuterol nebulizer one vial every 4-6 hours as needed  Please follow up in 2 weeks with me or sooner if needed       Fenton Foy, NP 01/06/2019

## 2019-01-06 NOTE — Patient Instructions (Addendum)
Asthma exacerbation slow to resolve/underlying sinusitis: Will check chest x ray in office today and call with results Xopenex neb given in office today Will order doxycycline  Please complete prednisone taper that was prescribed by urgent care May take mucinex DM twice daily May take zyrtec  Daily controller medications: Continue Symbicort 160/4.5 mcg 2 puffs twice daily with spacer Spiriva 2.5 mcg 1 puff once daily Continue Xolair every 2 weeks - will be switching to dupixent per allergy note - hopefully this will help with better control  Rescue medications: Proventil 4 puffs every 4-6 hours as needed or albuterol nebulizer one vial every 4-6 hours as needed  Please follow up in 2 weeks with me or sooner if needed

## 2019-01-06 NOTE — Addendum Note (Signed)
Addended by: Fenton Foy on: 01/06/2019 10:23 AM   Modules accepted: Orders

## 2019-01-06 NOTE — Assessment & Plan Note (Signed)
Patient Instructions  Asthma exacerbation slow to resolve/underlying sinusitis: Will check chest x ray in office today and call with results Xopenex neb given in office today Will order doxycycline  Please complete prednisone taper that was prescribed by urgent care May take mucinex DM twice daily May take zyrtec  Daily controller medications: Continue Symbicort 160/4.5 mcg 2 puffs twice daily with spacer Spiriva 2.5 mcg 1 puff once daily Continue Xolair every 2 weeks - will be switching to dupixent per allergy note - hopefully this will help with better control  Rescue medications: Proventil 4 puffs every 4-6 hours as needed or albuterol nebulizer one vial every 4-6 hours as needed  Please follow up in 2 weeks with me or sooner if needed

## 2019-01-07 ENCOUNTER — Observation Stay (HOSPITAL_COMMUNITY)
Admission: EM | Admit: 2019-01-07 | Discharge: 2019-01-10 | Disposition: A | Discharge status: Home / Self Care | Payer: Medicare Other | Attending: Student | Admitting: Student

## 2019-01-07 ENCOUNTER — Emergency Department (HOSPITAL_COMMUNITY): Payer: Medicare Other

## 2019-01-07 ENCOUNTER — Telehealth: Payer: Self-pay | Admitting: Nurse Practitioner

## 2019-01-07 ENCOUNTER — Other Ambulatory Visit: Payer: Self-pay

## 2019-01-07 ENCOUNTER — Encounter (HOSPITAL_COMMUNITY): Payer: Self-pay | Admitting: Internal Medicine

## 2019-01-07 DIAGNOSIS — I739 Peripheral vascular disease, unspecified: Secondary | ICD-10-CM | POA: Insufficient documentation

## 2019-01-07 DIAGNOSIS — Z7951 Long term (current) use of inhaled steroids: Secondary | ICD-10-CM | POA: Insufficient documentation

## 2019-01-07 DIAGNOSIS — J45901 Unspecified asthma with (acute) exacerbation: Principal | ICD-10-CM | POA: Insufficient documentation

## 2019-01-07 DIAGNOSIS — Z79899 Other long term (current) drug therapy: Secondary | ICD-10-CM | POA: Insufficient documentation

## 2019-01-07 DIAGNOSIS — R7303 Prediabetes: Secondary | ICD-10-CM | POA: Diagnosis not present

## 2019-01-07 DIAGNOSIS — Z7982 Long term (current) use of aspirin: Secondary | ICD-10-CM | POA: Insufficient documentation

## 2019-01-07 DIAGNOSIS — R42 Dizziness and giddiness: Secondary | ICD-10-CM | POA: Diagnosis not present

## 2019-01-07 DIAGNOSIS — G40909 Epilepsy, unspecified, not intractable, without status epilepticus: Secondary | ICD-10-CM

## 2019-01-07 DIAGNOSIS — D509 Iron deficiency anemia, unspecified: Secondary | ICD-10-CM | POA: Diagnosis not present

## 2019-01-07 DIAGNOSIS — I10 Essential (primary) hypertension: Secondary | ICD-10-CM | POA: Diagnosis not present

## 2019-01-07 DIAGNOSIS — J441 Chronic obstructive pulmonary disease with (acute) exacerbation: Secondary | ICD-10-CM | POA: Diagnosis not present

## 2019-01-07 DIAGNOSIS — F419 Anxiety disorder, unspecified: Secondary | ICD-10-CM | POA: Insufficient documentation

## 2019-01-07 DIAGNOSIS — R569 Unspecified convulsions: Secondary | ICD-10-CM

## 2019-01-07 DIAGNOSIS — M1990 Unspecified osteoarthritis, unspecified site: Secondary | ICD-10-CM | POA: Insufficient documentation

## 2019-01-07 DIAGNOSIS — K219 Gastro-esophageal reflux disease without esophagitis: Secondary | ICD-10-CM | POA: Diagnosis present

## 2019-01-07 DIAGNOSIS — E785 Hyperlipidemia, unspecified: Secondary | ICD-10-CM | POA: Diagnosis present

## 2019-01-07 DIAGNOSIS — Z87891 Personal history of nicotine dependence: Secondary | ICD-10-CM | POA: Insufficient documentation

## 2019-01-07 DIAGNOSIS — R05 Cough: Secondary | ICD-10-CM | POA: Diagnosis not present

## 2019-01-07 DIAGNOSIS — M797 Fibromyalgia: Secondary | ICD-10-CM | POA: Insufficient documentation

## 2019-01-07 DIAGNOSIS — F329 Major depressive disorder, single episode, unspecified: Secondary | ICD-10-CM | POA: Insufficient documentation

## 2019-01-07 DIAGNOSIS — R000 Tachycardia, unspecified: Secondary | ICD-10-CM | POA: Diagnosis not present

## 2019-01-07 DIAGNOSIS — R0600 Dyspnea, unspecified: Secondary | ICD-10-CM | POA: Diagnosis present

## 2019-01-07 DIAGNOSIS — J189 Pneumonia, unspecified organism: Secondary | ICD-10-CM | POA: Diagnosis not present

## 2019-01-07 DIAGNOSIS — R0602 Shortness of breath: Secondary | ICD-10-CM | POA: Diagnosis not present

## 2019-01-07 DIAGNOSIS — R069 Unspecified abnormalities of breathing: Secondary | ICD-10-CM | POA: Diagnosis not present

## 2019-01-07 DIAGNOSIS — R062 Wheezing: Secondary | ICD-10-CM | POA: Diagnosis present

## 2019-01-07 LAB — BASIC METABOLIC PANEL
Anion gap: 12 (ref 5–15)
BUN: 19 mg/dL (ref 6–20)
CO2: 27 mmol/L (ref 22–32)
Calcium: 9.2 mg/dL (ref 8.9–10.3)
Chloride: 97 mmol/L — ABNORMAL LOW (ref 98–111)
Creatinine, Ser: 0.8 mg/dL (ref 0.44–1.00)
GFR calc Af Amer: >60 mL/min (ref >60)
GFR calc non Af Amer: >60 mL/min (ref >60)
Glucose, Bld: 166 mg/dL — ABNORMAL HIGH (ref 70–99)
Potassium: 3.7 mmol/L (ref 3.5–5.1)
Sodium: 136 mmol/L (ref 135–145)

## 2019-01-07 LAB — CBC
HCT: 38.6 % (ref 36.0–46.0)
Hemoglobin: 12 g/dL (ref 12.0–15.0)
MCH: 28.6 pg (ref 26.0–34.0)
MCHC: 31.1 g/dL (ref 30.0–36.0)
MCV: 92.1 fL (ref 80.0–100.0)
Platelets: 353 10*3/uL (ref 150–400)
RBC: 4.19 MIL/uL (ref 3.87–5.11)
RDW: 14.7 % (ref 11.5–15.5)
WBC: 16.8 10*3/uL — ABNORMAL HIGH (ref 4.0–10.5)
nRBC: 0 % (ref 0.0–0.2)

## 2019-01-07 LAB — MAGNESIUM: Magnesium: 2.2 mg/dL (ref 1.7–2.4)

## 2019-01-07 LAB — GLUCOSE, CAPILLARY: Glucose-Capillary: 214 mg/dL — ABNORMAL HIGH (ref 70–99)

## 2019-01-07 LAB — PHOSPHORUS: Phosphorus: 2.3 mg/dL — ABNORMAL LOW (ref 2.5–4.6)

## 2019-01-07 LAB — TROPONIN I: Troponin I: <0.03 ng/mL (ref <0.03)

## 2019-01-07 MED ORDER — ACETAMINOPHEN 325 MG PO TABS
650.0000 mg | ORAL_TABLET | Freq: Four times a day (QID) | ORAL | Status: DC | PRN
  Administered 2019-01-07 – 2019-01-09 (×2): 650 mg via ORAL
  Filled 2019-01-07 (×2): qty 2

## 2019-01-07 MED ORDER — ALBUTEROL SULFATE (2.5 MG/3ML) 0.083% IN NEBU
2.5000 mg | INHALATION_SOLUTION | Freq: Four times a day (QID) | RESPIRATORY_TRACT | Status: DC
  Administered 2019-01-07: 2.5 mg via RESPIRATORY_TRACT
  Filled 2019-01-07: qty 3

## 2019-01-07 MED ORDER — IPRATROPIUM-ALBUTEROL 0.5-2.5 (3) MG/3ML IN SOLN
3.0000 mL | Freq: Once | RESPIRATORY_TRACT | Status: AC
  Administered 2019-01-07: 3 mL via RESPIRATORY_TRACT
  Filled 2019-01-07: qty 3

## 2019-01-07 MED ORDER — INSULIN ASPART 100 UNIT/ML ~~LOC~~ SOLN
0.0000 [IU] | Freq: Three times a day (TID) | SUBCUTANEOUS | Status: DC
  Administered 2019-01-08 (×2): 2 [IU] via SUBCUTANEOUS
  Administered 2019-01-08: 3 [IU] via SUBCUTANEOUS
  Administered 2019-01-09 – 2019-01-10 (×2): 2 [IU] via SUBCUTANEOUS

## 2019-01-07 MED ORDER — ACETAMINOPHEN 650 MG RE SUPP: 650.0000 mg | Freq: Four times a day (QID) | RECTAL | Status: DC | PRN

## 2019-01-07 MED ORDER — CARBAMAZEPINE 200 MG PO TABS
200.0000 mg | ORAL_TABLET | Freq: Every day | ORAL | Status: DC
  Administered 2019-01-08 – 2019-01-10 (×3): 200 mg via ORAL
  Filled 2019-01-07 (×3): qty 1

## 2019-01-07 MED ORDER — DOXYCYCLINE HYCLATE 100 MG PO TABS: 100.0000 mg | ORAL_TABLET | Freq: Two times a day (BID) | ORAL | Status: DC

## 2019-01-07 MED ORDER — MAGNESIUM SULFATE 2 GM/50ML IV SOLN
2.0000 g | Freq: Once | INTRAVENOUS | Status: DC
  Filled 2019-01-07: qty 50

## 2019-01-07 MED ORDER — ALBUTEROL SULFATE (2.5 MG/3ML) 0.083% IN NEBU: 2.5000 mg | INHALATION_SOLUTION | RESPIRATORY_TRACT | Status: DC | PRN

## 2019-01-07 MED ORDER — IPRATROPIUM-ALBUTEROL 0.5-2.5 (3) MG/3ML IN SOLN
3.0000 mL | Freq: Four times a day (QID) | RESPIRATORY_TRACT | Status: DC
  Administered 2019-01-08 – 2019-01-10 (×9): 3 mL via RESPIRATORY_TRACT
  Filled 2019-01-07 (×9): qty 3

## 2019-01-07 MED ORDER — ASPIRIN EC 325 MG PO TBEC
325.0000 mg | DELAYED_RELEASE_TABLET | Freq: Every day | ORAL | Status: DC
  Administered 2019-01-07 – 2019-01-08 (×2): 325 mg via ORAL
  Filled 2019-01-07 (×2): qty 1

## 2019-01-07 MED ORDER — ENOXAPARIN SODIUM 40 MG/0.4ML ~~LOC~~ SOLN
40.0000 mg | SUBCUTANEOUS | Status: DC
  Administered 2019-01-07 – 2019-01-09 (×3): 40 mg via SUBCUTANEOUS
  Filled 2019-01-07 (×3): qty 0.4

## 2019-01-07 MED ORDER — MAGNESIUM SULFATE 2 GM/50ML IV SOLN
2.0000 g | Freq: Once | INTRAVENOUS | Status: AC
  Administered 2019-01-07: 2 g via INTRAVENOUS
  Filled 2019-01-07: qty 50

## 2019-01-07 MED ORDER — POTASSIUM PHOSPHATE MONOBASIC 500 MG PO TABS
500.0000 mg | ORAL_TABLET | Freq: Three times a day (TID) | ORAL | Status: AC
  Administered 2019-01-07 – 2019-01-08 (×2): 500 mg via ORAL
  Filled 2019-01-07 (×2): qty 1

## 2019-01-07 MED ORDER — BENZONATATE 100 MG PO CAPS
200.0000 mg | ORAL_CAPSULE | Freq: Three times a day (TID) | ORAL | Status: DC | PRN
  Administered 2019-01-09: 200 mg via ORAL
  Filled 2019-01-07: qty 2

## 2019-01-07 MED ORDER — ENOXAPARIN SODIUM 40 MG/0.4ML ~~LOC~~ SOLN: 40.0000 mg | SUBCUTANEOUS | Status: DC

## 2019-01-07 MED ORDER — PREDNISONE 20 MG PO TABS
40.0000 mg | ORAL_TABLET | Freq: Every day | ORAL | Status: DC
  Administered 2019-01-09: 40 mg via ORAL
  Filled 2019-01-07: qty 2

## 2019-01-07 MED ORDER — CARBAMAZEPINE 200 MG PO TABS: 200.0000 mg | ORAL_TABLET | Freq: Two times a day (BID) | ORAL | Status: DC

## 2019-01-07 MED ORDER — FAMOTIDINE 20 MG PO TABS
20.0000 mg | ORAL_TABLET | Freq: Two times a day (BID) | ORAL | Status: DC
  Administered 2019-01-07 – 2019-01-10 (×6): 20 mg via ORAL
  Filled 2019-01-07 (×6): qty 1

## 2019-01-07 MED ORDER — PRAVASTATIN SODIUM 40 MG PO TABS
40.0000 mg | ORAL_TABLET | Freq: Every day | ORAL | Status: DC
  Administered 2019-01-08 – 2019-01-10 (×3): 40 mg via ORAL
  Filled 2019-01-07 (×3): qty 1

## 2019-01-07 MED ORDER — METHYLPREDNISOLONE SODIUM SUCC 40 MG IJ SOLR
40.0000 mg | Freq: Four times a day (QID) | INTRAMUSCULAR | Status: AC
  Administered 2019-01-07 – 2019-01-08 (×4): 40 mg via INTRAVENOUS
  Filled 2019-01-07 (×4): qty 1

## 2019-01-07 MED ORDER — IRBESARTAN 150 MG PO TABS
150.0000 mg | ORAL_TABLET | Freq: Every day | ORAL | Status: DC
  Administered 2019-01-08 – 2019-01-10 (×3): 150 mg via ORAL
  Filled 2019-01-07 (×3): qty 1

## 2019-01-07 MED ORDER — MECLIZINE HCL 25 MG PO TABS: 12.5000 mg | ORAL_TABLET | Freq: Three times a day (TID) | ORAL | Status: DC | PRN

## 2019-01-07 MED ORDER — AMOXICILLIN-POT CLAVULANATE 875-125 MG PO TABS
1.0000 | ORAL_TABLET | Freq: Two times a day (BID) | ORAL | Status: DC
  Administered 2019-01-07 – 2019-01-08 (×2): 1 via ORAL
  Filled 2019-01-07 (×2): qty 1

## 2019-01-07 MED ORDER — LEVOCETIRIZINE DIHYDROCHLORIDE 5 MG PO TABS: 5.0000 mg | ORAL_TABLET | Freq: Every day | ORAL | Status: DC

## 2019-01-07 MED ORDER — IPRATROPIUM BROMIDE 0.02 % IN SOLN
0.5000 mg | Freq: Four times a day (QID) | RESPIRATORY_TRACT | Status: DC
  Administered 2019-01-07: 0.5 mg via RESPIRATORY_TRACT
  Filled 2019-01-07: qty 2.5

## 2019-01-07 MED ORDER — HYDROCHLOROTHIAZIDE 12.5 MG PO CAPS
12.5000 mg | ORAL_CAPSULE | Freq: Every day | ORAL | Status: DC
  Administered 2019-01-08 – 2019-01-10 (×3): 12.5 mg via ORAL
  Filled 2019-01-07 (×3): qty 1

## 2019-01-07 MED ORDER — CARBAMAZEPINE 200 MG PO TABS
400.0000 mg | ORAL_TABLET | Freq: Every day | ORAL | Status: DC
  Administered 2019-01-07 – 2019-01-09 (×3): 400 mg via ORAL
  Filled 2019-01-07 (×3): qty 2

## 2019-01-07 MED ORDER — ALBUTEROL SULFATE (2.5 MG/3ML) 0.083% IN NEBU
5.0000 mg | INHALATION_SOLUTION | Freq: Once | RESPIRATORY_TRACT | Status: AC
  Administered 2019-01-07: 5 mg via RESPIRATORY_TRACT
  Filled 2019-01-07: qty 6

## 2019-01-07 MED ORDER — IRBESARTAN-HYDROCHLOROTHIAZIDE 150-12.5 MG PO TABS: 1.0000 | ORAL_TABLET | Freq: Every day | ORAL | Status: DC

## 2019-01-07 MED ORDER — POTASSIUM CHLORIDE IN NACL 20-0.45 MEQ/L-% IV SOLN
INTRAVENOUS | Status: DC
  Administered 2019-01-07: 19:00:00 via INTRAVENOUS
  Filled 2019-01-07 (×4): qty 1000

## 2019-01-07 MED ORDER — LORATADINE 10 MG PO TABS
10.0000 mg | ORAL_TABLET | Freq: Every day | ORAL | Status: DC
  Administered 2019-01-08 – 2019-01-10 (×3): 10 mg via ORAL
  Filled 2019-01-07 (×3): qty 1

## 2019-01-07 NOTE — ED Notes (Signed)
Bed: WA15 Expected date:  Expected time:  Means of arrival:  Comments: ResB 

## 2019-01-07 NOTE — Telephone Encounter (Signed)
Primary Pulmonologist: Dr. Melvyn Novas  Last office visit and with whom: 01/06/19 [place] What do we see them for (pulmonary problems): Asthma,Copd    Last OV assessment/plan:  Asthma exacerbation slow to resolve/underlying sinusitis: Will check chest x ray in office today and call with results Xopenex neb given in office today Will order doxycycline  Please complete prednisone taper that was prescribed by urgent care May take mucinex DM twice daily May take zyrtec  Daily controller medications: Continue Symbicort 160/4.5 mcg 2 puffs twice daily with spacer Spiriva 2.5 mcg 1 puff once daily Continue Xolair every 2 weeks - will be switching to dupixent per allergy note - hopefully this will help with better control  Rescue medications: Proventil 4 puffs every 4-6 hours as needed or albuterol nebulizer one vial every 4-6 hours as needed  Please follow up in 2 weeks with me or sooner if needed  Was appointment offered to patient (explain)?  I have called the patient and just by listing to the patient and her struggling to breath and wheezing I have asked the she go to the emergency room via ems. She verbalized understanding and states she will go to hospital.    Nothing further needed.

## 2019-01-07 NOTE — H&P (Signed)
History and Physical    [name] LOV:[id number] DOB: 03-07-1961 DOA: 01/07/2019  PCP: Biagio Borg, MD   Patient coming from: Home.  I have personally briefly reviewed patient's old medical records in [place]  Chief Complaint: Shortness of breath.  HPI: [name] is a 58 y.o. female with medical history significant of allergic rhinitis, angina, anxiety, depression, osteoarthritis, asthma/COPD, fibromyalgia, GERD, hyperlipidemia, hypertension, migraine headaches, nephrolithiasis, PVD, prediabetes, seizure disorder who is coming to the emergency department due to progressively worse cough for the past 2 weeks.  She saw her physician several days ago who prescribed tapering course of prednisone and doxycycline.  However, she has not improved and has continued to have persistent cough, wheezing and dyspnea.  Her dyspnea became so severe, despite the use of medication and bronchodilators at home, that she had to come to the emergency department.  She complains of fatigue, pleuritic chest pain and occasional clear sputum production.  She denies fever, chills, sore throat, rhinorrhea, hemoptysis, palpitations, dizziness, diaphoresis, PND, orthopnea or recent pitting edema of the lower extremities.  No abdominal pain, nausea, emesis, diarrhea, constipation, melena or hematochezia.  No dysuria, frequency or hematuria.  Denies polyuria, polydipsia, polyphagia or blurred vision.  ED Course: Cough initial vital signs temperature 98.1 F, pulse 80, respirations 25, blood pressure 129/81 mmHg and an O2 sat 100% on aerosol mask.  The patient was given supplemental oxygen, Solu-Medrol and bronchodilators in the emergency department.  I added magnesium sulfate 2 g IVPB.  Her white count was 16.8, hemoglobin 12.0 g/dL and platelets 353.  Troponin was normal.  EKG was sinus rhythm.  Her chloride level was 97 mmol/L.  Glucose 166 and phosphorus 2.3 mg/dL.  All other chemistry values are  within normal limits.  Her chest radiograph did not show any acute cardiopulmonary pathology.  Review of Systems: As per HPI otherwise 10 point review of systems negative.   Past Medical History:  Diagnosis Date  . ALLERGIC RHINITIS 05/12/2009   Qualifier: Diagnosis of  By: Lenn Cal Deborra Medina), Wynona Canes    . Anemia, iron deficiency 12/22/2014  . Angina   . Anxiety   . Arthritis   . Asthma   . ASTHMA 05/12/2009   Severe AFL (Spirometry 05/2009: pre-BD FEV1 0.87L 34% pred, post-BD FEV1 1.11L 44% pred) Volumes hyperinflated Decreased DLCO that does not fully correct to normal range for alveolar volume.     Marland Kitchen COPD 08/24/2009   Qualifier: Diagnosis of  By: Burnett Kanaris    . COPD (chronic obstructive pulmonary disease) ([place])   . Depression   . Fibromyalgia 05/14/2014  . GERD (gastroesophageal reflux disease)   . Hyperlipidemia 04/20/2017  . HYPERTENSION 05/12/2009   Qualifier: Diagnosis of  By: Lenn Cal Deborra Medina), Wynona Canes    . Migraine   . Nephrolithiasis   . Peripheral vascular disease ([place])   . Prediabetes 02/23/2014  . Seizure ([place])   . Seizure ([place])   . Urticaria     Past Surgical History:  Procedure Laterality Date  . ABDOMINAL HYSTERECTOMY    . BACK SURGERY    . COLONOSCOPY  12/20/2011   Procedure: COLONOSCOPY;  Surgeon: Landry Dyke, MD;  Location: [facility];  Service: Endoscopy;  Laterality: N/A;  . COLONOSCOPY  03/05/2012   Procedure: COLONOSCOPY;  Surgeon: Landry Dyke, MD;  Location: [facility];  Service: Endoscopy;  Laterality: N/A;  . DIAGNOSTIC LAPAROSCOPY    . HEMORRHOID SURGERY    . INCISE AND DRAIN ABCESS    .  KIDNEY STONE SURGERY    . NECK SURGERY    . TOE SURGERY    . TUBAL LIGATION       reports that she quit smoking about 7 months ago. Her smoking use included cigarettes. She has a 9.00 pack-year smoking history. She has never used smokeless tobacco. She reports previous alcohol use. She reports current drug use. Drug: Marijuana.  Allergies  Allergen  Reactions  . Codeine Nausea And Vomiting  . Sulfa Antibiotics Nausea And Vomiting  . Sulfonamide Derivatives Nausea And Vomiting    REACTION: n/v    Family History  Problem Relation Age of Onset  . Diabetes Mother   . COPD Mother   . Heart disease Mother   . Asthma Mother   . Diabetes Father   . Kidney disease Father   . Cancer Father   . Anesthesia problems Father   . Alcohol abuse Father   . Diabetes Sister   . Hypertension Sister   . Diabetes Brother   . Sleep apnea Brother   . Asthma Brother   . Alcohol abuse Brother   . Heart disease Sister   . Diabetes Sister   . Brain cancer Sister   . Heart disease Brother   . Asthma Sister   . Allergic rhinitis Neg Hx   . Eczema Neg Hx   . Urticaria Neg Hx    Prior to Admission medications   Medication Sig Start Date End Date Taking? Authorizing Provider  albuterol (PROAIR HFA) 108 (90 Base) MCG/ACT inhaler Inhale 2 puffs into the lungs every 4 (four) hours as needed for wheezing or shortness of breath. 12/15/18  Yes Bobbitt, Sedalia Muta, MD  albuterol (PROVENTIL) (2.5 MG/3ML) 0.083% nebulizer solution Take 3 mLs (2.5 mg total) by nebulization every 4 (four) hours as needed for wheezing or shortness of breath. Reported on 06/04/2016 12/23/18  Yes Valentina Shaggy, MD  aspirin 325 MG tablet Take 325 mg by mouth daily.   Yes [provider]  benzonatate (TESSALON) 200 MG capsule Take 1 capsule (200 mg total) by mouth 3 (three) times daily as needed for up to 7 days for cough. 01/03/19 01/10/19 Yes Wieters, Hallie C, PA-C  budesonide-formoterol (SYMBICORT) 160-4.5 MCG/ACT inhaler Inhale 2 puffs into the lungs 2 (two) times daily. 12/30/18  Yes Valentina Shaggy, MD  carbamazepine (TEGRETOL) 200 MG tablet 1  qam   2   qhs Patient taking differently: Take 200-400 mg by mouth 2 (two) times daily. 1  qam   2   qhs 12/19/18  Yes Plovsky, Berneta Sages, MD  doxycycline (VIBRA-TABS) 100 MG tablet Take 1 tablet (100 mg total) by mouth 2  (two) times daily. 01/06/19  Yes Fenton Foy, NP  DUPIXENT 300 MG/2ML SOSY  01/01/19  Yes [provider]  estradiol (CLIMARA - DOSED IN MG/24 HR) 0.05 mg/24hr patch Place 0.05 mg onto the skin once a week.   Yes [provider]  fluticasone (FLONASE) 50 MCG/ACT nasal spray Place 2 sprays into both nostrils daily. 07/09/18  Yes Tanda Rockers, MD  guaiFENesin (TUSSIN PO) Take by mouth.   Yes [provider]  hydrochlorothiazide (MICROZIDE) 12.5 MG capsule TAKE 1 CAPSULE(12.5 MG) BY MOUTH DAILY Patient taking differently: Take 12.5 mg by mouth daily.  07/08/18  Yes Biagio Borg, MD  ipratropium-albuterol (DUONEB) 0.5-2.5 (3) MG/3ML SOLN Take 3 mLs by nebulization every 4 (four) hours as needed. 01/03/19  Yes Wieters, Hallie C, PA-C  irbesartan-hydrochlorothiazide (AVALIDE) 150-12.5 MG tablet  TAKE 1 TABLET BY MOUTH EVERY DAY 12/18/18  Yes Biagio Borg, MD  levocetirizine (XYZAL) 5 MG tablet TAKE 1 TABLET BY MOUTH EVERY DAY AS NEEDED FOR ALLERGIES Patient taking differently: Take 5 mg by mouth daily.  12/18/18  Yes Bobbitt, Sedalia Muta, MD  meclizine (ANTIVERT) 12.5 MG tablet Take 1 tablet (12.5 mg total) by mouth 3 (three) times daily as needed for dizziness. 09/23/18 09/23/19 Yes Biagio Borg, MD  pravastatin (PRAVACHOL) 40 MG tablet TAKE 1 TABLET(40 MG) BY MOUTH DAILY Patient taking differently: Take 40 mg by mouth daily.  07/08/18  Yes Biagio Borg, MD  predniSONE (DELTASONE) 50 MG tablet Take 1 tablet (50 mg total) by mouth daily with breakfast for 5 days. 01/03/19 01/08/19 Yes Wieters, Hallie C, PA-C  triamcinolone cream (KENALOG) 0.1 % APPLY EXTERNALLY TO THE AFFECTED AREA TWICE DAILY Patient taking differently: Apply 1 application topically at bedtime.  12/18/18  Yes Biagio Borg, MD  vitamin C (ASCORBIC ACID) 500 MG tablet Take 500 mg by mouth daily.   Yes [provider]  albuterol (PROVENTIL HFA;VENTOLIN HFA) 108 (90 Base) MCG/ACT inhaler Inhale 2 puffs into  the lungs every 4 (four) hours as needed for wheezing or shortness of breath. Patient not taking: Reported on 01/07/2019 12/05/18   Bobbitt, Sedalia Muta, MD  Cetirizine HCl 10 MG CAPS Take 1 capsule (10 mg total) by mouth daily for 10 days. Patient not taking: Reported on 01/07/2019 01/03/19 01/13/19  Wieters, Hallie C, PA-C  montelukast (SINGULAIR) 10 MG tablet Take 1 tablet (10 mg total) by mouth at bedtime. Patient not taking: Reported on 01/07/2019 08/01/18   Kennith Gain, MD  pantoprazole (PROTONIX) 40 MG tablet TAKE 1 TABLET BY MOUTH 30 TO 60 MINUTES BEFORE FIRST MEAL OF THE DAY AS DIRECTED Patient not taking: Reported on 01/07/2019 07/28/18   Tanda Rockers, MD  ranitidine (ZANTAC) 150 MG tablet Take 1 tablet (150 mg total) by mouth 2 (two) times daily as needed for heartburn. Patient not taking: Reported on 01/07/2019 08/26/17   Collene Gobble, MD  tiZANidine (ZANAFLEX) 4 MG tablet TAKE 1 TABLET(4 MG) BY MOUTH EVERY 6 HOURS AS NEEDED FOR MUSCLE SPASMS Patient taking differently: Take 4 mg by mouth every 6 (six) hours as needed for muscle spasms.  07/23/17   Biagio Borg, MD    Physical Exam: Vitals:   01/07/19 1114 01/07/19 1342 01/07/19 1531 01/07/19 1610  BP:  (!) 141/102 (!) 145/100 134/90  Pulse:  93 84 89  Resp:  20 16 20   Temp:    98.4 F (36.9 C)  TempSrc:    Oral  SpO2: 100% 93% 97% 94%  Weight:    81.2 kg  Height:    5\' 3"  (1.6 m)    Constitutional: NAD, calm, comfortable Eyes: PERRL, lids and conjunctivae normal ENMT: Mucous membranes are moist. Posterior pharynx clear of any exudate or lesions. Neck: normal, supple, no masses, no thyromegaly Respiratory: Decreased breath sounds with bilateral rhonchi and wheezing, no crackles. Normal respiratory effort. No accessory muscle use.  Cardiovascular: Regular rate and rhythm, no murmurs / rubs / gallops. No extremity edema. 2+ pedal pulses. No carotid bruits.  Abdomen: Obese, soft, no tenderness, no masses palpated.  No hepatosplenomegaly. Bowel sounds positive.  Musculoskeletal: no clubbing / cyanosis.  Good ROM, no contractures. Normal muscle tone.  Skin: no rashes, lesions, ulcers. No induration on limited dermatological examination. Neurologic: CN 2-12 grossly intact. Sensation intact, DTR normal. Strength 5/5  in all 4.  Psychiatric: Normal judgment and insight. Alert and oriented x 3. Normal mood.   Labs on Admission: I have personally reviewed following labs and imaging studies  CBC: Recent Labs  Lab 01/07/19 1135  WBC 16.8*  HGB 12.0  HCT 38.6  MCV 92.1  PLT 914   Basic Metabolic Panel: Recent Labs  Lab 01/07/19 1135  NA 136  K 3.7  CL 97*  CO2 27  GLUCOSE 166*  BUN 19  CREATININE 0.80  CALCIUM 9.2  MG 2.2  PHOS 2.3*   GFR: Estimated Creatinine Clearance: 79.2 mL/min (by C-G formula based on SCr of 0.8 mg/dL). Liver Function Tests: No results for input(s): AST, ALT, ALKPHOS, BILITOT, PROT, ALBUMIN in the last 168 hours. No results for input(s): LIPASE, AMYLASE in the last 168 hours. No results for input(s): AMMONIA in the last 168 hours. Coagulation Profile: No results for input(s): INR, PROTIME in the last 168 hours. Cardiac Enzymes: Recent Labs  Lab 01/07/19 1135  TROPONINI <0.03   BNP (last 3 results) No results for input(s): PROBNP in the last 8760 hours. HbA1C: No results for input(s): HGBA1C in the last 72 hours. CBG: No results for input(s): GLUCAP in the last 168 hours. Lipid Profile: No results for input(s): CHOL, HDL, LDLCALC, TRIG, CHOLHDL, LDLDIRECT in the last 72 hours. Thyroid Function Tests: No results for input(s): TSH, T4TOTAL, FREET4, T3FREE, THYROIDAB in the last 72 hours. Anemia Panel: No results for input(s): VITAMINB12, FOLATE, FERRITIN, TIBC, IRON, RETICCTPCT in the last 72 hours. Urine analysis:    Component Value Date/Time   COLORURINE YELLOW 06/23/2018 [place] 06/23/2018 0947   LABSPEC 1.015 06/23/2018 0947    PHURINE 6.0 06/23/2018 0947   GLUCOSEU NEGATIVE 06/23/2018 0947   HGBUR NEGATIVE 06/23/2018 0947   BILIRUBINUR NEGATIVE 06/23/2018 0947   KETONESUR NEGATIVE 06/23/2018 0947   PROTEINUR NEG 11/02/2014 1657   UROBILINOGEN 0.2 06/23/2018 0947   NITRITE NEGATIVE 06/23/2018 0947   LEUKOCYTESUR NEGATIVE 06/23/2018 0947    Radiological Exams on Admission: Dg Chest 2 View  Result Date: 01/07/2019 CLINICAL DATA:  58 year old female with increasing shortness of breath and wheezing. Recently diagnosed with pneumonia, started on steroids and antibiotics. EXAM: CHEST - 2 VIEW COMPARISON:  Chest radiographs 01/06/2019 and earlier. FINDINGS: Lower lung volumes with crowding of lung markings. Mediastinal contours are stable and within normal limits. Visualized tracheal air column is within normal limits. No pneumothorax or pleural effusion. No consolidation or confluent opacity identified today. No acute osseous abnormality identified. Prior cervical ACDF. Negative visible bowel gas pattern. IMPRESSION: Lower lung volumes today with no acute cardiopulmonary abnormality identified. Electronically Signed   By: Genevie Ann M.D.   On: 01/07/2019 11:56   Dg Chest 2 View  Result Date: 01/06/2019 CLINICAL DATA:  Shortness of breath, wheezing, productive cough EXAM: CHEST - 2 VIEW COMPARISON:  12/23/2018 FINDINGS: Mild retrocardiac/left lower lobe opacity. Mild right basilar opacity. No pleural effusion or pneumothorax. The heart is normal in size. Cervical spine fixation hardware. IMPRESSION: Mild retrocardiac/left lower lobe opacity, atelectasis versus pneumonia. Mild right basilar opacity, likely atelectasis. Electronically Signed   By: Julian Hy M.D.   On: 01/06/2019 10:16    EKG: Independently reviewed. Vent. rate 82 BPM PR interval * ms QRS duration 94 ms QT/QTc 376/440 ms P-R-T axes 82 58 44 Sinus rhythm  Assessment/Plan Principal Problem:   Asthma exacerbation with COPD  Continue supplemental  oxygen. Continue a schedule and as needed bronchodilators. Solu-Medrol 40  mg IVP every 6 hours. Carb modified diet and CBG monitoring with RI SS while on steroids  Active Problems:   HTN (hypertension) Hold hydrochlorothiazide. Continue Avalide 150-12.5 mg p.o. daily.    GERD (gastroesophageal reflux disease) Famotidine 20 mg p.o. twice daily.    Hyperlipidemia Continue pravastatin 40 mg p.o. daily. Monitor LFTs as needed. Fasting lipid follow-up as an outpatient..    Seizure ([place]) Continue carbamazepine 200 mg in a.m. Continue carbamazepine 400 mg p.o. at bedtime.    Hypophosphatemia Replacement ordered. Follow-up level as needed.    DVT prophylaxis: Lovenox SQ. Code Status: Full code. Family Communication:  Disposition Plan: 24 to 48-hour observation for asthma observation treatment. Consults called: Admission status: Observation/telemetry.   Reubin Milan MD  01/07/2019, 4:27 PM

## 2019-01-07 NOTE — ED Triage Notes (Signed)
Pt to ED with c/o SOB. Pt was recently diagnosed with pneumonia yesterday and started on prednisone and antibiotics. Pt called Drs office this morning with c/o of SOB and they advised her to come the ED. Pt was given Albuterol 15 mg, 1 mg of Atrovent, and 125 mg of Solumedrol. Pt is A&O x4. Pt has hx of asthma and COPD.
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
## 2019-01-07 NOTE — ED Notes (Signed)
Bed: RESB Expected date:  Expected time:  Means of arrival:  Comments: EMS/shob

## 2019-01-07 NOTE — ED Notes (Signed)
ED TO INPATIENT HANDOFF REPORT  Name/Age/Gender [name] 58 y.o. female  Code Status Code Status History    Date Active Date Inactive Code Status Order ID Comments User Context   04/15/2018 0029 04/17/2018 2306 Full Code [id number]  Ivor Costa, MD ED   12/23/2012 1659 12/29/2012 1624 Full Code [id number]  Tanda Rockers, MD ED   10/22/2011 0524 10/26/2011 1553 Full Code [id number]  Mordecai Maes, RN Inpatient      Home/SNF/Other Home  Chief Complaint shob  Level of Care/Admitting Diagnosis ED Disposition    ED Disposition Condition [place]: [facility] [[id number]]  Level of Care: Telemetry [5]  Admit to tele based on following criteria: Monitor for Ischemic changes  Diagnosis: Asthma exacerbation with COPD (chronic obstructive pulmonary disease) [facility]) [[id number]]  Admitting Physician: Reubin Milan [[id number]]  Attending Physician: Reubin Milan [phone]  PT Class (Do Not Modify): Observation [104]  PT Acc Code (Do Not Modify): Observation [10022]       Medical History Past Medical History:  Diagnosis Date  . ALLERGIC RHINITIS 05/12/2009   Qualifier: Diagnosis of  By: Lenn Cal Deborra Medina), Wynona Canes    . Anemia, iron deficiency 12/22/2014  . Angina   . Anxiety   . Arthritis   . Asthma   . ASTHMA 05/12/2009   Severe AFL (Spirometry 05/2009: pre-BD FEV1 0.87L 34% pred, post-BD FEV1 1.11L 44% pred) Volumes hyperinflated Decreased DLCO that does not fully correct to normal range for alveolar volume.     Marland Kitchen COPD 08/24/2009   Qualifier: Diagnosis of  By: Burnett Kanaris    . COPD (chronic obstructive pulmonary disease) ([place])   . Depression   . Fibromyalgia 05/14/2014  . GERD (gastroesophageal reflux disease)   . Hyperlipidemia 04/20/2017  . HYPERTENSION 05/12/2009   Qualifier: Diagnosis of  By: Lenn Cal Deborra Medina), Wynona Canes    . Migraine   . Nephrolithiasis   . Peripheral vascular disease ([place])   . Prediabetes 02/23/2014  .  Seizure ([place])   . Seizure ([place])   . Urticaria     Allergies Allergies  Allergen Reactions  . Codeine Nausea And Vomiting  . Sulfa Antibiotics Nausea And Vomiting  . Sulfonamide Derivatives Nausea And Vomiting    REACTION: n/v    IV Location/Drains/Wounds Patient Lines/Drains/Airways Status   Active Line/Drains/Airways    Name:   Placement date:   Placement time:   Site:   Days:   Peripheral IV 01/07/19 Left Wrist   01/07/19    1037    Wrist   less than 1          Labs/Imaging Results for orders placed or performed during the hospital encounter of 01/07/19 (from the past 48 hour(s))  CBC     Status: Abnormal   Collection Time: 01/07/19 11:35 AM  Result Value Ref Range   WBC 16.8 (H) 4.0 - 10.5 K/uL   RBC 4.19 3.87 - 5.11 MIL/uL   Hemoglobin 12.0 12.0 - 15.0 g/dL   HCT 38.6 36.0 - 46.0 %   MCV 92.1 80.0 - 100.0 fL   MCH 28.6 26.0 - 34.0 pg   MCHC 31.1 30.0 - 36.0 g/dL   RDW 14.7 11.5 - 15.5 %   Platelets 353 150 - 400 K/uL   nRBC 0.0 0.0 - 0.2 %    Comment: Performed at [facility], [place] [address] [place], [place] [address]  Basic metabolic panel     Status: Abnormal  Collection Time: 01/07/19 11:35 AM  Result Value Ref Range   Sodium 136 135 - 145 mmol/L   Potassium 3.7 3.5 - 5.1 mmol/L   Chloride 97 (L) 98 - 111 mmol/L   CO2 27 22 - 32 mmol/L   Glucose, Bld 166 (H) 70 - 99 mg/dL   BUN 19 6 - 20 mg/dL   Creatinine, Ser 0.80 0.44 - 1.00 mg/dL   Calcium 9.2 8.9 - 10.3 mg/dL   GFR calc non Af Amer >60 >60 mL/min   GFR calc Af Amer >60 >60 mL/min   Anion gap 12 5 - 15    Comment: Performed at [facility], [place] [address] [place], [place] [address]  Troponin I - ONCE - STAT     Status: None   Collection Time: 01/07/19 11:35 AM  Result Value Ref Range   Troponin I <0.03 <0.03 ng/mL    Comment: Performed at [facility], [place] [address] [place], [place] [address]  Magnesium     Status: None   Collection  Time: 01/07/19 11:35 AM  Result Value Ref Range   Magnesium 2.2 1.7 - 2.4 mg/dL    Comment: Performed at [facility], [place] [address] [place], [place] [address]   Dg Chest 2 View  Result Date: 01/07/2019 CLINICAL DATA:  58 year old female with increasing shortness of breath and wheezing. Recently diagnosed with pneumonia, started on steroids and antibiotics. EXAM: CHEST - 2 VIEW COMPARISON:  Chest radiographs 01/06/2019 and earlier. FINDINGS: Lower lung volumes with crowding of lung markings. Mediastinal contours are stable and within normal limits. Visualized tracheal air column is within normal limits. No pneumothorax or pleural effusion. No consolidation or confluent opacity identified today. No acute osseous abnormality identified. Prior cervical ACDF. Negative visible bowel gas pattern. IMPRESSION: Lower lung volumes today with no acute cardiopulmonary abnormality identified. Electronically Signed   By: Genevie Ann M.D.   On: 01/07/2019 11:56   Dg Chest 2 View  Result Date: 01/06/2019 CLINICAL DATA:  Shortness of breath, wheezing, productive cough EXAM: CHEST - 2 VIEW COMPARISON:  12/23/2018 FINDINGS: Mild retrocardiac/left lower lobe opacity. Mild right basilar opacity. No pleural effusion or pneumothorax. The heart is normal in size. Cervical spine fixation hardware. IMPRESSION: Mild retrocardiac/left lower lobe opacity, atelectasis versus pneumonia. Mild right basilar opacity, likely atelectasis. Electronically Signed   By: Julian Hy M.D.   On: 01/06/2019 10:16    Pending Labs Unresulted Labs (From admission, onward)    Start     Ordered   01/07/19 1448  Phosphorus  Add-on,   R     01/07/19 1447          Vitals/Pain Today's Vitals   01/07/19 1114 01/07/19 1115 01/07/19 1342 01/07/19 1531  BP:   (!) 141/102 (!) 145/100  Pulse:   93 84  Resp:   20 16  Temp:      TempSrc:      SpO2: 100%  93% 97%  Weight:      Height:      PainSc:  4       Isolation  Precautions No active isolations  Medications Medications  magnesium sulfate IVPB 2 g 50 mL (has no administration in time range)  0.45 % NaCl with KCl 20 mEq / L infusion (has no administration in time range)  albuterol (PROVENTIL) (2.5 MG/3ML) 0.083% nebulizer solution 5 mg (5 mg Nebulization Given 01/07/19 1117)  ipratropium-albuterol (DUONEB) 0.5-2.5 (3) MG/3ML nebulizer solution 3 mL (  3 mLs Nebulization Given 01/07/19 1117)    Mobility walks

## 2019-01-08 DIAGNOSIS — J45901 Unspecified asthma with (acute) exacerbation: Secondary | ICD-10-CM | POA: Diagnosis not present

## 2019-01-08 LAB — COMPREHENSIVE METABOLIC PANEL
ALT: 27 U/L (ref 0–44)
AST: 16 U/L (ref 15–41)
Albumin: 3.7 g/dL (ref 3.5–5.0)
Alkaline Phosphatase: 72 U/L (ref 38–126)
Anion gap: 8 (ref 5–15)
BUN: 16 mg/dL (ref 6–20)
CO2: 25 mmol/L (ref 22–32)
Calcium: 9 mg/dL (ref 8.9–10.3)
Chloride: 102 mmol/L (ref 98–111)
Creatinine, Ser: 0.61 mg/dL (ref 0.44–1.00)
GFR calc Af Amer: >60 mL/min (ref >60)
GFR calc non Af Amer: >60 mL/min (ref >60)
Glucose, Bld: 150 mg/dL — ABNORMAL HIGH (ref 70–99)
Potassium: 4.4 mmol/L (ref 3.5–5.1)
Sodium: 135 mmol/L (ref 135–145)
Total Bilirubin: 0.3 mg/dL (ref 0.3–1.2)
Total Protein: 6.5 g/dL (ref 6.5–8.1)

## 2019-01-08 LAB — CBC WITH DIFFERENTIAL/PLATELET
Abs Immature Granulocytes: 0.28 10*3/uL — ABNORMAL HIGH (ref 0.00–0.07)
Basophils Absolute: 0 10*3/uL (ref 0.0–0.1)
Basophils Relative: 0 %
Eosinophils Absolute: 0 10*3/uL (ref 0.0–0.5)
Eosinophils Relative: 0 %
HCT: 36.3 % (ref 36.0–46.0)
Hemoglobin: 11.4 g/dL — ABNORMAL LOW (ref 12.0–15.0)
Immature Granulocytes: 1 %
Lymphocytes Relative: 11 %
Lymphs Abs: 2.5 10*3/uL (ref 0.7–4.0)
MCH: 29 pg (ref 26.0–34.0)
MCHC: 31.4 g/dL (ref 30.0–36.0)
MCV: 92.4 fL (ref 80.0–100.0)
Monocytes Absolute: 1 10*3/uL (ref 0.1–1.0)
Monocytes Relative: 4 %
Neutro Abs: 18 10*3/uL — ABNORMAL HIGH (ref 1.7–7.7)
Neutrophils Relative %: 84 %
Platelets: 357 10*3/uL (ref 150–400)
RBC: 3.93 MIL/uL (ref 3.87–5.11)
RDW: 14.7 % (ref 11.5–15.5)
WBC: 21.7 10*3/uL — ABNORMAL HIGH (ref 4.0–10.5)
nRBC: 0 % (ref 0.0–0.2)

## 2019-01-08 LAB — GLUCOSE, CAPILLARY
Glucose-Capillary: 144 mg/dL — ABNORMAL HIGH (ref 70–99)
Glucose-Capillary: 144 mg/dL — ABNORMAL HIGH (ref 70–99)
Glucose-Capillary: 164 mg/dL — ABNORMAL HIGH (ref 70–99)
Glucose-Capillary: 106 mg/dL — ABNORMAL HIGH (ref 70–99)

## 2019-01-08 MED ORDER — ARFORMOTEROL TARTRATE 15 MCG/2ML IN NEBU
15.0000 ug | INHALATION_SOLUTION | Freq: Two times a day (BID) | RESPIRATORY_TRACT | Status: DC
  Administered 2019-01-08 – 2019-01-10 (×4): 15 ug via RESPIRATORY_TRACT
  Filled 2019-01-08 (×4): qty 2

## 2019-01-08 MED ORDER — DM-GUAIFENESIN ER 30-600 MG PO TB12
1.0000 | ORAL_TABLET | Freq: Two times a day (BID) | ORAL | Status: DC
  Administered 2019-01-08 – 2019-01-10 (×5): 1 via ORAL
  Filled 2019-01-08 (×5): qty 1

## 2019-01-08 MED ORDER — BUDESONIDE 0.5 MG/2ML IN SUSP
0.5000 mg | Freq: Two times a day (BID) | RESPIRATORY_TRACT | Status: DC
  Administered 2019-01-08 – 2019-01-10 (×5): 0.5 mg via RESPIRATORY_TRACT
  Filled 2019-01-08 (×5): qty 2

## 2019-01-08 MED ORDER — LEVOFLOXACIN 750 MG PO TABS
750.0000 mg | ORAL_TABLET | Freq: Every day | ORAL | Status: DC
  Administered 2019-01-08 – 2019-01-10 (×3): 750 mg via ORAL
  Filled 2019-01-08 (×4): qty 1

## 2019-01-08 NOTE — Progress Notes (Signed)
PROGRESS NOTE  [name] DXA:[id number] DOB: 1960-12-27 DOA: 01/07/2019 PCP: Biagio Borg, MD   LOS: 0 days   Brief Narrative / Interim history:  COPD, allergic rhinitis, anxiety, depression, OA, fibromyalgia, GERD, hyperlipidemia, hypertension, migraine headache, PVD, prediabetes and seizure disorder admitted with progressively worsening cough, dyspnea and wheezing for 2 weeks after she failed outpatient therapy with doxycycline and prednisone. Started on Solu-Medrol, budesonide, DuoNeb and mucolytic's.   Subjective: Continues to endorse shortness of breath and wheezing.  Denies chest pain.  Cough with whitish phlegm.  Denies hemoptysis.  Reports good compliance with her Symbicort at home.  Quit smoking about 8 months ago.  Assessment & Plan: Principal Problem:   Asthma exacerbation with COPD (chronic obstructive pulmonary disease) (HCC) Active Problems:   HTN (hypertension)   GERD (gastroesophageal reflux disease)   Hyperlipidemia   Seizure (HCC)   Hypophosphatemia  Acute on chronic COPD/asthma: Patient with cardinal symptoms of COPD exacerbation including dyspnea, cough and sputum production.  Failed outpatient therapy.  Still with diminished aeration and wheezing.  Does not feel quite back to her baseline.  CXR not impressive. -Continue Solu-Medrol and DuoNeb -Add budesonide and Brovana -Changed antibiotic to Levaquin -Continue continue home Singulair -Add mucolytic's -Stop home aspirin  Hypertension: Normotensive -Continue home meds  Seizure disorder: Stable -Continue home Tegretol  Leukocytosis: Likely due to steroid.  Scheduled Meds: . amoxicillin-clavulanate  1 tablet Oral Q12H  . aspirin EC  325 mg Oral Daily  . budesonide (PULMICORT) nebulizer solution  0.5 mg Nebulization BID  . carbamazepine  200 mg Oral Daily   And  . carbamazepine  400 mg Oral QHS  . dextromethorphan-guaiFENesin  1 tablet Oral BID  . enoxaparin (LOVENOX) injection  40 mg  Subcutaneous Q24H  . famotidine  20 mg Oral BID  . irbesartan  150 mg Oral Daily   And  . hydrochlorothiazide  12.5 mg Oral Daily  . insulin aspart  0-15 Units Subcutaneous TID WC  . ipratropium-albuterol  3 mL Nebulization Q6H  . loratadine  10 mg Oral Daily  . pravastatin  40 mg Oral Daily  . [START ON 01/09/2019] predniSONE  40 mg Oral Q breakfast   Continuous Infusions: PRN Meds:.acetaminophen **OR** acetaminophen, albuterol, benzonatate, meclizine  DVT prophylaxis: Subcu Lovenox Code Status: Full code Family Communication: Family about bedside Disposition Plan: Remains inpatient.  Still with significant wheezing and dyspnea.   Consultants:   None  Procedures:   None  Antimicrobials:  Augmentin 1/22-1/23  Levaquin 1/23  Objective: Vitals:   01/08/19 0613 01/08/19 0853 01/08/19 0856 01/08/19 1346  BP: 117/74   118/69  Pulse: 74   85  Resp: 20   16  Temp: 97.9 F (36.6 C)   98.5 F (36.9 C)  TempSrc: Oral   Oral  SpO2: 95% 90% 90% 95%  Weight:      Height:        Intake/Output Summary (Last 24 hours) at 01/08/2019 1442 Last data filed at 01/08/2019 1347 Gross per 24 hour  Intake 2437.99 ml  Output 1100 ml  Net 1337.99 ml   Filed Weights   01/07/19 1100 01/07/19 1610  Weight: 82.6 kg 81.2 kg    Examination:  GENERAL: Appears well. No acute distress.  HEENT: MMM.  Vision and Hearing grossly intact.  NECK: Supple.  No JVD.  LUNGS:  No IWOB.  Poor aeration bilaterally.  Wheezing or rhonchi bilaterally HEART:  RRR. Heart sounds normal. ABD: Bowel sounds present. Soft. Non tender.  MSK/EXT:  no edema bilaterally.  SKIN: no apparent skin lesion.  NEURO: Awake, alert and oriented appropriately.  No gross deficit.  PSYCH: Calm. Normal affect.   Data Reviewed: I have independently reviewed following labs and imaging studies  CBC: Recent Labs  Lab 01/07/19 1135 01/08/19 0454  WBC 16.8* 21.7*  NEUTROABS  --  18.0*  HGB 12.0 11.4*  HCT 38.6 36.3    MCV 92.1 92.4  PLT 353 937   Basic Metabolic Panel: Recent Labs  Lab 01/07/19 1135 01/08/19 0454  NA 136 135  K 3.7 4.4  CL 97* 102  CO2 27 25  GLUCOSE 166* 150*  BUN 19 16  CREATININE 0.80 0.61  CALCIUM 9.2 9.0  MG 2.2  --   PHOS 2.3*  --    GFR: Estimated Creatinine Clearance: 79.2 mL/min (by C-G formula based on SCr of 0.61 mg/dL). Liver Function Tests: Recent Labs  Lab 01/08/19 0454  AST 16  ALT 27  ALKPHOS 72  BILITOT 0.3  PROT 6.5  ALBUMIN 3.7   No results for input(s): LIPASE, AMYLASE in the last 168 hours. No results for input(s): AMMONIA in the last 168 hours. Coagulation Profile: No results for input(s): INR, PROTIME in the last 168 hours. Cardiac Enzymes: Recent Labs  Lab 01/07/19 1135  TROPONINI <0.03   BNP (last 3 results) No results for input(s): PROBNP in the last 8760 hours. HbA1C: No results for input(s): HGBA1C in the last 72 hours. CBG: Recent Labs  Lab 01/07/19 2107 01/08/19 0717 01/08/19 1125  GLUCAP 214* 144* 144*   Lipid Profile: No results for input(s): CHOL, HDL, LDLCALC, TRIG, CHOLHDL, LDLDIRECT in the last 72 hours. Thyroid Function Tests: No results for input(s): TSH, T4TOTAL, FREET4, T3FREE, THYROIDAB in the last 72 hours. Anemia Panel: No results for input(s): VITAMINB12, FOLATE, FERRITIN, TIBC, IRON, RETICCTPCT in the last 72 hours. Urine analysis:    Component Value Date/Time   COLORURINE YELLOW 06/23/2018 [place] 06/23/2018 0947   LABSPEC 1.015 06/23/2018 0947   PHURINE 6.0 06/23/2018 0947   GLUCOSEU NEGATIVE 06/23/2018 0947   HGBUR NEGATIVE 06/23/2018 0947   BILIRUBINUR NEGATIVE 06/23/2018 0947   KETONESUR NEGATIVE 06/23/2018 0947   PROTEINUR NEG 11/02/2014 1657   UROBILINOGEN 0.2 06/23/2018 0947   NITRITE NEGATIVE 06/23/2018 0947   LEUKOCYTESUR NEGATIVE 06/23/2018 0947   Sepsis Labs: Invalid input(s): PROCALCITONIN, LACTICIDVEN  No results found for this or any previous visit (from  the past 240 hour(s)).    Radiology Studies: No results found.  Ly Wass T. [facility] Pager [phone]  If 7PM-7AM, please contact night-coverage [email or web address] Password [email or web address] 01/08/2019, 2:42 PM

## 2019-01-08 NOTE — Progress Notes (Signed)
Initial Nutrition Assessment  DOCUMENTATION CODES:   Obesity unspecified  INTERVENTION:   -Encouraged consistent intakes  -Reviewed affordable protein options  NUTRITION DIAGNOSIS:   Increased nutrient needs related to (COPD) as evidenced by estimated needs.  GOAL:   Patient will meet greater than or equal to 90% of their needs  MONITOR:   PO intake, Weight trends, Labs, I & O's  REASON FOR ASSESSMENT:   Consult COPD Protocol  ASSESSMENT:   58 y.o. female with medical history significant of allergic rhinitis, angina, anxiety, depression, osteoarthritis, asthma/COPD, fibromyalgia, GERD, hyperlipidemia, hypertension, migraine headaches, nephrolithiasis, PVD, prediabetes, seizure disorder who is coming to the emergency department due to progressively worse cough for the past 2 weeks.    Patient reports she has a good appetite. Pt has eaten 90-100% of meals today. Pt reports she does not eat 3 meals a day at home d/t financial limitations. Pt states she relies on her disability check to pay for for her groceries for the whole month. Pt states she eats lunch but may not have the food available to eat dinner and breakfast too. Pt states she will eat more meals when her daughter provides her meals. Pt states she will eat chicken, vienna sausages, bacon and eggs she gets from [facility]. Reviewed affordable protein food options.  Pt denies chewing issues. Reports some difficulty swallowing associated with reflux.   Pt reports she has been gaining weight.   Medications: K-Phos TID  Labs reviewed: CBGs: 144-214   NUTRITION - FOCUSED PHYSICAL EXAM:  Nutrition focused physical exam shows no sign of depletion of muscle mass or body fat.  Diet Order:   Diet Order            Diet heart healthy/carb modified Room service appropriate? Yes; Fluid consistency: Thin  Diet effective now              EDUCATION NEEDS:   Education needs have been addressed  Skin:  Skin Assessment:  Reviewed RN Assessment  Last BM:  1/21  Height:   Ht Readings from Last 1 Encounters:  01/07/19 5\' 3"  (1.6 m)    Weight:   Wt Readings from Last 1 Encounters:  01/07/19 81.2 kg    Ideal Body Weight:  52.3 kg  BMI:  Body mass index is 31.73 kg/m.  Estimated Nutritional Needs:   Kcal:  1700-1900  Protein:  70-80g  Fluid:  1.9L/day  Clayton Bibles, MS, RD, LDN [place] Dietitian Pager: [phone] After Hours Pager: [phone]

## 2019-01-08 NOTE — Progress Notes (Signed)
SATURATION QUALIFICATIONS: (This note is used to comply with regulatory documentation for home oxygen)  Patient Saturations on Room Air at Rest = 95%  Patient Saturations on Room Air while Ambulating =88%  Patient Saturations on 2 Liters of oxygen while Ambulating = 96%  Please briefly explain why patient needs home oxygen: shortness of breath and desaturation with ambulation

## 2019-01-08 NOTE — Care Management Obs Status (Signed)
[facility] NOTIFICATION   Patient Details  Name: [name] MRN: [id number] Date of Birth: 1961-01-18   Medicare Observation Status Notification Given:  Yes    MahabirJuliann Pulse, RN 01/08/2019, 3:06 PM

## 2019-01-09 DIAGNOSIS — J441 Chronic obstructive pulmonary disease with (acute) exacerbation: Secondary | ICD-10-CM | POA: Diagnosis not present

## 2019-01-09 DIAGNOSIS — I10 Essential (primary) hypertension: Secondary | ICD-10-CM

## 2019-01-09 DIAGNOSIS — J45901 Unspecified asthma with (acute) exacerbation: Secondary | ICD-10-CM

## 2019-01-09 DIAGNOSIS — R569 Unspecified convulsions: Secondary | ICD-10-CM | POA: Diagnosis not present

## 2019-01-09 DIAGNOSIS — E785 Hyperlipidemia, unspecified: Secondary | ICD-10-CM | POA: Diagnosis not present

## 2019-01-09 LAB — GLUCOSE, CAPILLARY
Glucose-Capillary: 182 mg/dL — ABNORMAL HIGH (ref 70–99)
Glucose-Capillary: 81 mg/dL (ref 70–99)
Glucose-Capillary: 84 mg/dL (ref 70–99)
Glucose-Capillary: 142 mg/dL — ABNORMAL HIGH (ref 70–99)
Glucose-Capillary: 184 mg/dL — ABNORMAL HIGH (ref 70–99)

## 2019-01-09 LAB — BASIC METABOLIC PANEL
Anion gap: 9 (ref 5–15)
BUN: 22 mg/dL — ABNORMAL HIGH (ref 6–20)
CO2: 29 mmol/L (ref 22–32)
Calcium: 9.1 mg/dL (ref 8.9–10.3)
Chloride: 100 mmol/L (ref 98–111)
Creatinine, Ser: 0.75 mg/dL (ref 0.44–1.00)
GFR calc Af Amer: >60 mL/min (ref >60)
GFR calc non Af Amer: >60 mL/min (ref >60)
Glucose, Bld: 87 mg/dL (ref 70–99)
Potassium: 4.2 mmol/L (ref 3.5–5.1)
Sodium: 138 mmol/L (ref 135–145)

## 2019-01-09 LAB — CBC
HCT: 37.2 % (ref 36.0–46.0)
Hemoglobin: 11.6 g/dL — ABNORMAL LOW (ref 12.0–15.0)
MCH: 28.7 pg (ref 26.0–34.0)
MCHC: 31.2 g/dL (ref 30.0–36.0)
MCV: 92.1 fL (ref 80.0–100.0)
Platelets: 382 10*3/uL (ref 150–400)
RBC: 4.04 MIL/uL (ref 3.87–5.11)
RDW: 14.8 % (ref 11.5–15.5)
WBC: 21.6 10*3/uL — ABNORMAL HIGH (ref 4.0–10.5)
nRBC: 0 % (ref 0.0–0.2)

## 2019-01-09 MED ORDER — ZOLPIDEM TARTRATE 5 MG PO TABS
5.0000 mg | ORAL_TABLET | Freq: Once | ORAL | Status: DC
  Filled 2019-01-09: qty 1

## 2019-01-09 MED ORDER — PREDNISONE 50 MG PO TABS: 60.0000 mg | ORAL_TABLET | Freq: Every day | ORAL | Status: DC

## 2019-01-09 MED ORDER — DOCUSATE SODIUM 100 MG PO CAPS
100.0000 mg | ORAL_CAPSULE | Freq: Two times a day (BID) | ORAL | Status: DC
  Administered 2019-01-09 – 2019-01-10 (×3): 100 mg via ORAL
  Filled 2019-01-09 (×3): qty 1

## 2019-01-09 MED ORDER — METHYLPREDNISOLONE SODIUM SUCC 125 MG IJ SOLR
60.0000 mg | Freq: Three times a day (TID) | INTRAMUSCULAR | Status: DC
  Administered 2019-01-09 – 2019-01-10 (×3): 60 mg via INTRAVENOUS
  Filled 2019-01-09 (×3): qty 2

## 2019-01-09 NOTE — Progress Notes (Signed)
SATURATION QUALIFICATIONS: (This note is used to comply with regulatory documentation for home oxygen)  Patient Saturations on Room Air at Rest = 100%  Patient Saturations on Room Air while Ambulating = 95%  Please briefly explain why patient needs home oxygen: Pt with history of Asthma/COPD

## 2019-01-09 NOTE — Progress Notes (Signed)
Pt ambulated around hallway on room air. Sats ranged from 82-95%. Pt tolerated well. When O2 sat dropped to 82%, encouraged pt to take deep breaths in, O2 sat increased to 95% RN notified.

## 2019-01-09 NOTE — Progress Notes (Signed)
PROGRESS NOTE  [name] NKN:[id number] DOB: 04-May-1961 DOA: 01/07/2019 PCP: Biagio Borg, MD   LOS: 0 days   Brief Narrative / Interim history:  COPD, allergic rhinitis, anxiety, depression, OA, fibromyalgia, GERD, hyperlipidemia, hypertension, migraine headache, PVD, prediabetes and seizure disorder admitted with progressively worsening cough, dyspnea and wheezing for 2 weeks after she failed outpatient therapy with doxycycline and prednisone. Started on Solu-Medrol, budesonide, DuoNeb and mucolytic's.  She continued to have dyspnea, wheezing and tight lungs.  Increased Solu-Medrol to 60 mg every 8 hours and added Brovana.  Subjective: Ambulated and maintained oxygen saturation greater than 95% on room air morning.  However, she continues to feel dyspneic and wheezy.  Also productive cough with white phlegm. Assessment & Plan: Principal Problem:   Asthma exacerbation with COPD (chronic obstructive pulmonary disease) (HCC) Active Problems:   HTN (hypertension)   GERD (gastroesophageal reflux disease)   Hyperlipidemia   Seizure (HCC)   Hypophosphatemia  Acute on chronic COPD/asthma: Patient with cardinal symptoms of COPD exacerbation including dyspnea, cough and sputum production.  Failed outpatient therapy.  Chest x-ray without acute finding.  Lungs feel tight.  Still with significant wheezing and cough.  -Increase Solu-Medrol to 60 mg every 8 hours -Continue budesonide and Brovana -Augmentin 1/22-1/23 -Levaquin 1/23--> -Continue continue home Singulair -Continue mucolytics -Stop home aspirin -Incentive spirometry and flutter valve  Hypertension: Normotensive -Continue home meds  Seizure disorder: Stable -Continue home Tegretol  Leukocytosis: Stable.  Likely due to steroid.  Scheduled Meds: . arformoterol  15 mcg Nebulization BID  . budesonide (PULMICORT) nebulizer solution  0.5 mg Nebulization BID  . carbamazepine  200 mg Oral Daily   And  . carbamazepine   400 mg Oral QHS  . dextromethorphan-guaiFENesin  1 tablet Oral BID  . docusate sodium  100 mg Oral BID  . enoxaparin (LOVENOX) injection  40 mg Subcutaneous Q24H  . famotidine  20 mg Oral BID  . irbesartan  150 mg Oral Daily   And  . hydrochlorothiazide  12.5 mg Oral Daily  . insulin aspart  0-15 Units Subcutaneous TID WC  . ipratropium-albuterol  3 mL Nebulization Q6H  . levofloxacin  750 mg Oral Daily  . loratadine  10 mg Oral Daily  . methylPREDNISolone (SOLU-MEDROL) injection  60 mg Intravenous Q8H  . pravastatin  40 mg Oral Daily   Continuous Infusions: PRN Meds:.acetaminophen **OR** acetaminophen, albuterol, benzonatate, meclizine  DVT prophylaxis: Subcu Lovenox Code Status: Full code Family Communication: Son at bedside. Disposition Plan: Remains inpatient.  Still with tight lung, wheezing and cough.  Consultants:   None  Procedures:   None  Antimicrobials:  Augmentin 1/22-1/23  Levaquin 1/23-->  Objective: Vitals:   01/09/19 0131 01/09/19 0635 01/09/19 0745 01/09/19 0939  BP:  126/73    Pulse:  66    Resp:  18    Temp:  97.9 F (36.6 C)    TempSrc:  Oral    SpO2: 96% 95% (!) 82% 97%  Weight:      Height:        Intake/Output Summary (Last 24 hours) at 01/09/2019 1233 Last data filed at 01/09/2019 0838 Gross per 24 hour  Intake 720 ml  Output 250 ml  Net 470 ml   Filed Weights   01/07/19 1100 01/07/19 1610  Weight: 82.6 kg 81.2 kg    Examination:  GENERAL: Appears well. No acute distress.  HEENT: MMM.  Vision and Hearing grossly intact.  NECK: Supple.  No JVD.  LUNGS:  No  IWOB.  Poor aeration bilaterally.  Wheezing or rhonchi bilaterally HEART:  RRR. Heart sounds normal. ABD: Bowel sounds present. Soft. Non tender.  MSK/EXT:   no edema bilaterally.  SKIN: no apparent skin lesion.  NEURO: Awake, alert and oriented appropriately.  No gross deficit.  PSYCH: Calm. Normal affect.   Data Reviewed: I have independently reviewed following labs  and imaging studies  CBC: Recent Labs  Lab 01/07/19 1135 01/08/19 0454 01/09/19 0400  WBC 16.8* 21.7* 21.6*  NEUTROABS  --  18.0*  --   HGB 12.0 11.4* 11.6*  HCT 38.6 36.3 37.2  MCV 92.1 92.4 92.1  PLT 353 357 623   Basic Metabolic Panel: Recent Labs  Lab 01/07/19 1135 01/08/19 0454 01/09/19 0400  NA 136 135 138  K 3.7 4.4 4.2  CL 97* 102 100  CO2 27 25 29   GLUCOSE 166* 150* 87  BUN 19 16 22*  CREATININE 0.80 0.61 0.75  CALCIUM 9.2 9.0 9.1  MG 2.2  --   --   PHOS 2.3*  --   --    GFR: Estimated Creatinine Clearance: 79.2 mL/min (by C-G formula based on SCr of 0.75 mg/dL). Liver Function Tests: Recent Labs  Lab 01/08/19 0454  AST 16  ALT 27  ALKPHOS 72  BILITOT 0.3  PROT 6.5  ALBUMIN 3.7   No results for input(s): LIPASE, AMYLASE in the last 168 hours. No results for input(s): AMMONIA in the last 168 hours. Coagulation Profile: No results for input(s): INR, PROTIME in the last 168 hours. Cardiac Enzymes: Recent Labs  Lab 01/07/19 1135  TROPONINI <0.03   BNP (last 3 results) No results for input(s): PROBNP in the last 8760 hours. HbA1C: No results for input(s): HGBA1C in the last 72 hours. CBG: Recent Labs  Lab 01/08/19 0717 01/08/19 1125 01/08/19 1641 01/08/19 2229 01/09/19 0731  GLUCAP 144* 144* 164* 106* 81   Lipid Profile: No results for input(s): CHOL, HDL, LDLCALC, TRIG, CHOLHDL, LDLDIRECT in the last 72 hours. Thyroid Function Tests: No results for input(s): TSH, T4TOTAL, FREET4, T3FREE, THYROIDAB in the last 72 hours. Anemia Panel: No results for input(s): VITAMINB12, FOLATE, FERRITIN, TIBC, IRON, RETICCTPCT in the last 72 hours. Urine analysis:    Component Value Date/Time   COLORURINE YELLOW 06/23/2018 [place] 06/23/2018 0947   LABSPEC 1.015 06/23/2018 0947   PHURINE 6.0 06/23/2018 0947   GLUCOSEU NEGATIVE 06/23/2018 0947   HGBUR NEGATIVE 06/23/2018 0947   BILIRUBINUR NEGATIVE 06/23/2018 0947   KETONESUR  NEGATIVE 06/23/2018 0947   PROTEINUR NEG 11/02/2014 1657   UROBILINOGEN 0.2 06/23/2018 0947   NITRITE NEGATIVE 06/23/2018 0947   LEUKOCYTESUR NEGATIVE 06/23/2018 0947   Sepsis Labs: Invalid input(s): PROCALCITONIN, LACTICIDVEN  No results found for this or any previous visit (from the past 240 hour(s)).    Radiology Studies: No results found.  T[facility] T[facility] Pager [phone]  If 7PM-7AM, please contact night-coverage [email or web address] Password [facility] 01/09/2019, 12:33 PM

## 2019-01-10 DIAGNOSIS — I10 Essential (primary) hypertension: Secondary | ICD-10-CM | POA: Diagnosis not present

## 2019-01-10 DIAGNOSIS — E785 Hyperlipidemia, unspecified: Secondary | ICD-10-CM

## 2019-01-10 DIAGNOSIS — J45901 Unspecified asthma with (acute) exacerbation: Secondary | ICD-10-CM | POA: Diagnosis not present

## 2019-01-10 DIAGNOSIS — R569 Unspecified convulsions: Secondary | ICD-10-CM | POA: Diagnosis not present

## 2019-01-10 DIAGNOSIS — J441 Chronic obstructive pulmonary disease with (acute) exacerbation: Secondary | ICD-10-CM | POA: Diagnosis not present

## 2019-01-10 LAB — GLUCOSE, CAPILLARY
Glucose-Capillary: 128 mg/dL — ABNORMAL HIGH (ref 70–99)
Glucose-Capillary: 171 mg/dL — ABNORMAL HIGH (ref 70–99)

## 2019-01-10 MED ORDER — IPRATROPIUM-ALBUTEROL 0.5-2.5 (3) MG/3ML IN SOLN
3.0000 mL | Freq: Three times a day (TID) | RESPIRATORY_TRACT | Status: DC
  Administered 2019-01-10: 3 mL via RESPIRATORY_TRACT
  Filled 2019-01-10: qty 3

## 2019-01-10 MED ORDER — PREDNISONE 10 MG PO TABS: ORAL_TABLET | ORAL | 0 refills | Status: DC

## 2019-01-10 MED ORDER — LEVOFLOXACIN 750 MG PO TABS: 750.0000 mg | ORAL_TABLET | Freq: Every day | ORAL | 0 refills | Status: DC

## 2019-01-10 NOTE — Discharge Summary (Signed)
Physician Discharge Summary  [name] ZOX:[id number] DOB: 1961/03/01 DOA: 01/07/2019  PCP: Biagio Borg, MD  Admit date: 01/07/2019 Discharge date: 01/10/2019  Admitted From: Home Disposition: Home  Recommendations for Outpatient Follow-up:  1. Follow up with PCP and pulmonology in 1-2 weeks 2. Please follow up on the following pending results: None  Home Health: None Equipment/Devices: None  Discharge Condition: Stable CODE STATUS: Full code  HPI: Per Dr. Olevia Bowens, [name] is a 58 y.o. female with medical history significant of allergic rhinitis, angina, anxiety, depression, osteoarthritis, asthma/COPD, fibromyalgia, GERD, hyperlipidemia, hypertension, migraine headaches, nephrolithiasis, PVD, prediabetes, seizure disorder who is coming to the emergency department due to progressively worse cough for the past 2 weeks.  She saw her physician several days ago who prescribed tapering course of prednisone and doxycycline.  However, she has not improved and has continued to have persistent cough, wheezing and dyspnea.  Her dyspnea became so severe, despite the use of medication and bronchodilators at home, that she had to come to the emergency department.  She complains of fatigue, pleuritic chest pain and occasional clear sputum production.  She denies fever, chills, sore throat, rhinorrhea, hemoptysis, palpitations, dizziness, diaphoresis, PND, orthopnea or recent pitting edema of the lower extremities.  No abdominal pain, nausea, emesis, diarrhea, constipation, melena or hematochezia.  No dysuria, frequency or hematuria.  Denies polyuria, polydipsia, polyphagia or blurred vision.  ED Course: Cough initial vital signs temperature 98.1 F, pulse 80, respirations 25, blood pressure 129/81 mmHg and an O2 sat 100% on aerosol mask.  The patient was given supplemental oxygen, Solu-Medrol and bronchodilators in the emergency department.  I added magnesium sulfate 2 g  IVPB.  Her white count was 16.8, hemoglobin 12.0 g/dL and platelets 353.  Troponin was normal.  EKG was sinus rhythm.  Her chloride level was 97 mmol/L.  Glucose 166 and phosphorus 2.3 mg/dL.  All other chemistry values are within normal limits.  Her chest radiograph did not show any acute cardiopulmonary pathology.  Hospital Course: 58 year old female with a history of COPD/asthma on Biologics, allergic rhinitis, anxiety, depression, OA, fibromyalgia, GERD, hyperlipidemia, hypertension, migraine headache, PVD, prediabetes and seizure disorder admitted with progressively worsening cough, dyspnea and wheezing for 2 weeks after she failed outpatient therapy with doxycycline and prednisone. Started on Solu-Medrol, budesonide, DuoNeb and mucolytic's.  She continued to have dyspnea, wheezing and tight lungs.  Increased Solu-Medrol to 60 mg every 8 hours and added Brovana with some improvement in her breathing.  Ambulated on room air and maintained oxygen saturation greater than 91%. Discharged on home breathing treatments, Levaquin and prolonged steroid taper.  Patient to follow-up with a pulmonologist in 1 to 2 weeks.  See individual problem list below for more.  Discharge Diagnoses:  Principal Problem:   Asthma exacerbation with COPD (chronic obstructive pulmonary disease) ([place]) Active Problems:   HTN (hypertension)   GERD (gastroesophageal reflux disease)   Hyperlipidemia   Seizure (HCC)   Hypophosphatemia  Acute on chronic COPD/asthma: Patient with cardinal symptoms of COPD exacerbation including dyspnea, cough and sputum production.  Failed outpatient therapy.  Chest x-ray without acute finding.  Improved aeration and with single lung exam. -Discharged on prolonged steroid taper, Levaquin and home breathing treatments. -Augmentin 1/22-1/23 -Levaquin 1/23--1/28 -Continue continue home Singulair -Continue mucolytics -Stop home aspirin -Follow-up with pulmonology 1 to 2  weeks.  Hypertension: Normotensive -Continue home meds  Seizure disorder: Stable -Continue home Tegretol  Other chronic medical conditions stable.  Discharged on home medications.  Discharge Instructions  Discharge Instructions    Call MD for:  difficulty breathing, headache or visual disturbances   Complete by:  As directed    Call MD for:  extreme fatigue   Complete by:  As directed    Call MD for:  persistant dizziness or light-headedness   Complete by:  As directed    Call MD for:  persistant nausea and vomiting   Complete by:  As directed    Call MD for:  severe uncontrolled pain   Complete by:  As directed    Call MD for:  temperature >100.4   Complete by:  As directed    Diet general   Complete by:  As directed    Discharge instructions   Complete by:  As directed    Please follow-up with your primary care doctor and your pulmonologist in the next 1 to 2 weeks. Please take your medications as prescribed. Please read the directions on indication before you take them. Please do the new medication list we gave you as we have updated some of your medications. TakeCare,   Increase activity slowly   Complete by:  As directed      Allergies as of 01/10/2019      Reactions   Codeine Nausea And Vomiting   Sulfa Antibiotics Nausea And Vomiting   Sulfonamide Derivatives Nausea And Vomiting   REACTION: n/v      Medication List    STOP taking these medications   aspirin 325 MG tablet   Cetirizine HCl 10 MG Caps   doxycycline 100 MG tablet Commonly known as:  VIBRA-TABS   ranitidine 150 MG tablet Commonly known as:  ZANTAC     TAKE these medications   albuterol 108 (90 Base) MCG/ACT inhaler Commonly known as:  PROAIR HFA Inhale 2 puffs into the lungs every 4 (four) hours as needed for wheezing or shortness of breath. What changed:  Another medication with the same name was removed. Continue taking this medication, and follow the directions you see here.    albuterol (2.5 MG/3ML) 0.083% nebulizer solution Commonly known as:  PROVENTIL Take 3 mLs (2.5 mg total) by nebulization every 4 (four) hours as needed for wheezing or shortness of breath. Reported on 06/04/2016 What changed:  Another medication with the same name was removed. Continue taking this medication, and follow the directions you see here.   benzonatate 200 MG capsule Commonly known as:  TESSALON Take 1 capsule (200 mg total) by mouth 3 (three) times daily as needed for up to 7 days for cough.   budesonide-formoterol 160-4.5 MCG/ACT inhaler Commonly known as:  SYMBICORT Inhale 2 puffs into the lungs 2 (two) times daily.   carbamazepine 200 MG tablet Commonly known as:  TEGRETOL 1  qam   2   qhs What changed:    how much to take  how to take this  when to take this   DUPIXENT 300 MG/2ML Sosy Generic drug:  Dupilumab   estradiol 0.05 mg/24hr patch Commonly known as:  CLIMARA - Dosed in mg/24 hr Place 0.05 mg onto the skin once a week.   fluticasone 50 MCG/ACT nasal spray Commonly known as:  FLONASE Place 2 sprays into both nostrils daily.   hydrochlorothiazide 12.5 MG capsule Commonly known as:  MICROZIDE TAKE 1 CAPSULE(12.5 MG) BY MOUTH DAILY What changed:  See the new instructions.   ipratropium-albuterol 0.5-2.5 (3) MG/3ML Soln Commonly known as:  DUONEB Take 3 mLs by nebulization every 4 (four) hours  as needed.   irbesartan-hydrochlorothiazide 150-12.5 MG tablet Commonly known as:  AVALIDE TAKE 1 TABLET BY MOUTH EVERY DAY   levocetirizine 5 MG tablet Commonly known as:  XYZAL TAKE 1 TABLET BY MOUTH EVERY DAY AS NEEDED FOR ALLERGIES What changed:  See the new instructions.   levofloxacin 750 MG tablet Commonly known as:  LEVAQUIN Take 1 tablet (750 mg total) by mouth daily. Start taking on:  January 11, 2019   meclizine 12.5 MG tablet Commonly known as:  ANTIVERT Take 1 tablet (12.5 mg total) by mouth 3 (three) times daily as needed for  dizziness.   montelukast 10 MG tablet Commonly known as:  SINGULAIR Take 1 tablet (10 mg total) by mouth at bedtime.   pantoprazole 40 MG tablet Commonly known as:  PROTONIX TAKE 1 TABLET BY MOUTH 30 TO 60 MINUTES BEFORE FIRST MEAL OF THE DAY AS DIRECTED   pravastatin 40 MG tablet Commonly known as:  PRAVACHOL TAKE 1 TABLET(40 MG) BY MOUTH DAILY What changed:  See the new instructions.   predniSONE 10 MG tablet Commonly known as:  DELTASONE Take 6 tablets (60 mg total) by mouth daily for 3 days, THEN 4 tablets (40 mg total) daily for 2 days, THEN 2 tablets (20 mg total) daily for 2 days, THEN 1 tablet (10 mg total) daily for 2 days, THEN 0.5 tablets (5 mg total) daily for 2 days. Start taking on:  January 10, 2019 What changed:    medication strength  See the new instructions.   tiZANidine 4 MG tablet Commonly known as:  ZANAFLEX TAKE 1 TABLET(4 MG) BY MOUTH EVERY 6 HOURS AS NEEDED FOR MUSCLE SPASMS What changed:  See the new instructions.   triamcinolone cream 0.1 % Commonly known as:  KENALOG APPLY EXTERNALLY TO THE AFFECTED AREA TWICE DAILY What changed:  See the new instructions.   TUSSIN PO Take by mouth.   vitamin C 500 MG tablet Commonly known as:  ASCORBIC ACID Take 500 mg by mouth daily.      Follow-up Information    Collene Gobble, MD. Schedule an appointment as soon as possible for a visit in 1 week(s).   Specialty:  Pulmonary Disease Contact information: [address] [address] [phone]           Consultations:  None  Procedures/Studies:  2D echo   Dg Chest 2 View  Result Date: 01/07/2019 CLINICAL DATA:  58 year old female with increasing shortness of breath and wheezing. Recently diagnosed with pneumonia, started on steroids and antibiotics. EXAM: CHEST - 2 VIEW COMPARISON:  Chest radiographs 01/06/2019 and earlier. FINDINGS: Lower lung volumes with crowding of lung markings. Mediastinal contours are stable  and within normal limits. Visualized tracheal air column is within normal limits. No pneumothorax or pleural effusion. No consolidation or confluent opacity identified today. No acute osseous abnormality identified. Prior cervical ACDF. Negative visible bowel gas pattern. IMPRESSION: Lower lung volumes today with no acute cardiopulmonary abnormality identified. Electronically Signed   By: Genevie Ann M.D.   On: 01/07/2019 11:56   Dg Chest 2 View  Result Date: 01/06/2019 CLINICAL DATA:  Shortness of breath, wheezing, productive cough EXAM: CHEST - 2 VIEW COMPARISON:  12/23/2018 FINDINGS: Mild retrocardiac/left lower lobe opacity. Mild right basilar opacity. No pleural effusion or pneumothorax. The heart is normal in size. Cervical spine fixation hardware. IMPRESSION: Mild retrocardiac/left lower lobe opacity, atelectasis versus pneumonia. Mild right basilar opacity, likely atelectasis. Electronically Signed   By: Bertis Ruddy  [place] Pink M.D.   On: 01/06/2019 10:16   Dg Chest 2 View  Result Date: 12/23/2018 CLINICAL DATA:  Productive cough for 1 month.  Asthma.  COPD. EXAM: CHEST - 2 VIEW COMPARISON:  10/05/2018 FINDINGS: The heart size and mediastinal contours are within normal limits. Aortic atherosclerosis. Both lungs are clear. Cervical spine fusion hardware again noted. IMPRESSION: No active cardiopulmonary disease. Electronically Signed   By: Earle Gell M.D.   On: 12/23/2018 15:23      Subjective: No major events overnight of this morning.  Sitting on bedside chair.  Reports some improvement in her breathing, wheeze and cough.  Denies chest pain.  Discharge Exam: Vitals:   01/10/19 0941 01/10/19 0947  BP:    Pulse:    Resp:    Temp:    SpO2: 96% 96%    GENERAL: Appears well. No acute distress.  HEENT: MMM.  Vision and Hearing grossly intact.  NECK: Supple.  No JVD.  LUNGS:  No IWOB.  On room air.  Fair air movement bilaterally.  Upper airway sounds transmitted throughout. HEART:  RRR. Heart  sounds normal.  ABD: Bowel sounds present. Soft. Non tender.  EXT:   no edema bilaterally.  SKIN: no apparent skin lesion.  NEURO: Awake, alert and oriented appropriately.  No gross deficit.  PSYCH: Calm. Normal affect.  The results of significant diagnostics from this hospitalization (including imaging, microbiology, ancillary and laboratory) are listed below for reference.     Microbiology: No results found for this or any previous visit (from the past 240 hour(s)).   Labs: BNP (last 3 results) No results for input(s): BNP in the last 8760 hours. Basic Metabolic Panel: Recent Labs  Lab 01/07/19 1135 01/08/19 0454 01/09/19 0400  NA 136 135 138  K 3.7 4.4 4.2  CL 97* 102 100  CO2 27 25 29   GLUCOSE 166* 150* 87  BUN 19 16 22*  CREATININE 0.80 0.61 0.75  CALCIUM 9.2 9.0 9.1  MG 2.2  --   --   PHOS 2.3*  --   --    Liver Function Tests: Recent Labs  Lab 01/08/19 0454  AST 16  ALT 27  ALKPHOS 72  BILITOT 0.3  PROT 6.5  ALBUMIN 3.7   No results for input(s): LIPASE, AMYLASE in the last 168 hours. No results for input(s): AMMONIA in the last 168 hours. CBC: Recent Labs  Lab 01/07/19 1135 01/08/19 0454 01/09/19 0400  WBC 16.8* 21.7* 21.6*  NEUTROABS  --  18.0*  --   HGB 12.0 11.4* 11.6*  HCT 38.6 36.3 37.2  MCV 92.1 92.4 92.1  PLT 353 357 382   Cardiac Enzymes: Recent Labs  Lab 01/07/19 1135  TROPONINI <0.03   BNP: Invalid input(s): POCBNP CBG: Recent Labs  Lab 01/09/19 1239 01/09/19 1700 01/09/19 2206 01/10/19 0757 01/10/19 1159  GLUCAP 84 142* 184* 128* 171*   D-Dimer No results for input(s): DDIMER in the last 72 hours. Hgb A1c No results for input(s): HGBA1C in the last 72 hours. Lipid Profile No results for input(s): CHOL, HDL, LDLCALC, TRIG, CHOLHDL, LDLDIRECT in the last 72 hours. Thyroid function studies No results for input(s): TSH, T4TOTAL, T3FREE, THYROIDAB in the last 72 hours.  Invalid input(s): FREET3 Anemia work up No  results for input(s): VITAMINB12, FOLATE, FERRITIN, TIBC, IRON, RETICCTPCT in the last 72 hours. Urinalysis    Component Value Date/Time   COLORURINE YELLOW 06/23/2018 [place] 06/23/2018 0947   LABSPEC 1.015 06/23/2018 0947  PHURINE 6.0 06/23/2018 0947   GLUCOSEU NEGATIVE 06/23/2018 0947   HGBUR NEGATIVE 06/23/2018 [place] 06/23/2018 0947   KETONESUR NEGATIVE 06/23/2018 0947   PROTEINUR NEG 11/02/2014 1657   UROBILINOGEN 0.2 06/23/2018 0947   NITRITE NEGATIVE 06/23/2018 0947   LEUKOCYTESUR NEGATIVE 06/23/2018 0947   Sepsis Labs Invalid input(s): PROCALCITONIN,  WBC,  LACTICIDVEN   Time coordinating discharge: 45 minutes  SIGNED:  Mercy Riding, MD  [facility] 01/10/2019, 12:52 PM Pager [phone]  If 7PM-7AM, please contact night-coverage [email or web address] Password [email or web address]

## 2019-01-10 NOTE — Progress Notes (Signed)
SATURATION QUALIFICATIONS: (This note is used to comply with regulatory documentation for home oxygen)  Patient Saturations on Room Air at Rest = 94%  Patient Saturations on Room Air while Ambulating = 91%   

## 2019-01-12 ENCOUNTER — Other Ambulatory Visit: Payer: Self-pay | Admitting: Allergy & Immunology

## 2019-01-12 ENCOUNTER — Telehealth: Payer: Self-pay | Admitting: *Deleted

## 2019-01-12 NOTE — Telephone Encounter (Signed)
Transition Care Management Follow-up Telephone Call   Date discharged? 01/10/19   How have you been since you were released from the hospital? Pt states she feel terrible. Ongoing cough and know she has this shaky feeling and being sweaty. Ask pt has she notice if she's been running a temp. Pt states she haven't check but will check. She is currently taking the taper prednisone inform pt sometimes the shakiness can come from the prednisone.    Do you understand why you were in the hospital? YES   Do you understand the discharge instructions? YES   Where were you discharged to? Home   Items Reviewed:  Medications reviewed: YES  Allergies reviewed: YES  Dietary changes reviewed: YES, heart healthy. She states she really doesn't have an appetite. Inform pt to try to stay dehydrated, and she need to eat something when taking the antibiotic and prednisone.  Referrals reviewed: YES, will see pulmonology 01/20/19  Functional Questionnaire:   Activities of Daily Living (ADLs):   She state she are independent in the following: ambulation, bathing and hygiene, feeding, continence, grooming, toileting and dressing States she doesn't require assistance   Any transportation issues/concerns?: NO   Any patient concerns? NO   Confirmed importance and date/time of follow-up visits scheduled YES, appt 01/14/19  Provider Appointment booked with Dr. Jenny Reichmann   Confirmed with patient if condition begins to worsen call PCP or go to the ER.  Patient was given the office number and encouraged to call back with question or concerns.  : YES

## 2019-01-12 NOTE — Telephone Encounter (Signed)
Please advise 

## 2019-01-13 ENCOUNTER — Ambulatory Visit: Payer: Medicare Other

## 2019-01-14 ENCOUNTER — Encounter: Payer: Self-pay | Admitting: Internal Medicine

## 2019-01-14 ENCOUNTER — Ambulatory Visit (INDEPENDENT_AMBULATORY_CARE_PROVIDER_SITE_OTHER): Payer: Medicare Other | Admitting: Internal Medicine

## 2019-01-14 VITALS — BP 116/70 | HR 81 | Temp 98.5°F | Ht 63.0 in | Wt 183.0 lb

## 2019-01-14 DIAGNOSIS — J441 Chronic obstructive pulmonary disease with (acute) exacerbation: Secondary | ICD-10-CM

## 2019-01-14 DIAGNOSIS — J45901 Unspecified asthma with (acute) exacerbation: Secondary | ICD-10-CM | POA: Diagnosis not present

## 2019-01-14 DIAGNOSIS — J329 Chronic sinusitis, unspecified: Secondary | ICD-10-CM

## 2019-01-14 DIAGNOSIS — I10 Essential (primary) hypertension: Secondary | ICD-10-CM | POA: Diagnosis not present

## 2019-01-14 MED ORDER — ALBUTEROL SULFATE HFA 108 (90 BASE) MCG/ACT IN AERS: 2.0000 | INHALATION_SPRAY | RESPIRATORY_TRACT | 3 refills | Status: DC | PRN

## 2019-01-14 MED ORDER — LEVOFLOXACIN 500 MG PO TABS: 500.0000 mg | ORAL_TABLET | Freq: Every day | ORAL | 0 refills | Status: AC

## 2019-01-14 MED ORDER — PREDNISONE 10 MG PO TABS: ORAL_TABLET | ORAL | 0 refills | Status: DC

## 2019-01-14 NOTE — Assessment & Plan Note (Signed)
stable overall by history and exam, recent data reviewed with pt, and pt to continue medical treatment as before,  to f/u any worsening symptoms or concerns  

## 2019-01-14 NOTE — Progress Notes (Signed)
Subjective:    Patient ID: [name], female    DOB: 06-May-1961, 58 y.o.   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 Started on Solu-Medrol, budesonide, DuoNeb and mucolytic's.She continued to have dyspnea, wheezing and tight lungs.IncreasedSolu-Medrol to 60 mg every 8 hours andadded Brovana with some improvement in her breathing.  Ambulated on room air and maintained oxygen saturation greater than 91%. Discharged on home breathing treatments, Levaquin and prolonged steroid taper.  Patient to follow-up with a pulmonologist in 1 to 2 weeks.  Has appt with Dr Lamonte Sakai feb 4.  Has ongoing chronic sinus pain pressure daily for many years, stopped her daily good powders at last admit, but now more pain and feels stopped up despite all tx so far, asks to see ENT.  Also today., Pt denies chest pain, wheezing but still with significant sob/doe, out of her rescure inhaler, no orthopnea, PND, increased LE swelling, palpitations, dizziness or syncope. Interestingly as well - she states she takes the pred prolonged taper as per d/c instructions but has run out of pills before the rx intended Past Medical History:  Diagnosis Date  . ALLERGIC RHINITIS 05/12/2009   Qualifier: Diagnosis of  By: Lenn Cal Deborra Medina), Wynona Canes    . Anemia, iron deficiency 12/22/2014  . Angina   . Anxiety   . Arthritis   . Asthma   . ASTHMA 05/12/2009   Severe AFL (Spirometry 05/2009: pre-BD FEV1 0.87L 34% pred, post-BD FEV1 1.11L 44% pred) Volumes hyperinflated Decreased DLCO that does not fully correct to normal range for alveolar volume.     Marland Kitchen COPD 08/24/2009   Qualifier: Diagnosis of  By:  Burnett Kanaris    . COPD (chronic obstructive pulmonary disease) ([place])   . Depression   . Fibromyalgia 05/14/2014  . GERD (gastroesophageal reflux disease)   . Hyperlipidemia 04/20/2017  . HYPERTENSION 05/12/2009   Qualifier: Diagnosis of  By: Lenn Cal Deborra Medina), Wynona Canes    . Migraine   . Nephrolithiasis   . Peripheral vascular disease ([place])   . Prediabetes 02/23/2014  . Seizure ([place])   . Seizure ([place])   . Urticaria    Past Surgical History:  Procedure Laterality Date  . ABDOMINAL HYSTERECTOMY    . BACK SURGERY    . COLONOSCOPY  12/20/2011   Procedure: COLONOSCOPY;  Surgeon: Landry Dyke, MD;  Location: [facility];  Service: Endoscopy;  Laterality: N/A;  . COLONOSCOPY  03/05/2012   Procedure: COLONOSCOPY;  Surgeon: Landry Dyke, MD;  Location: [facility];  Service: Endoscopy;  Laterality: N/A;  . DIAGNOSTIC LAPAROSCOPY    . HEMORRHOID SURGERY    . INCISE AND DRAIN ABCESS    . KIDNEY STONE SURGERY    . NECK SURGERY    . TOE SURGERY    . TUBAL LIGATION      reports that she quit smoking about 7 months ago. Her smoking use included cigarettes. She has a 9.00 pack-year smoking history. She has never used smokeless tobacco. She reports previous alcohol use. She reports current drug use. Drug: Marijuana. family history includes Alcohol abuse in her brother and father; Anesthesia problems in her father; Asthma in her brother, mother, and sister; Brain cancer in her sister; COPD in her mother; Cancer in her father; Diabetes  in her brother, father, mother, sister, and sister; Heart disease in her brother, mother, and sister; Hypertension in her sister; Kidney disease in her father; Sleep apnea in her brother. Allergies  Allergen Reactions  . Codeine Nausea And Vomiting  . Sulfa Antibiotics Nausea And Vomiting  . Sulfonamide Derivatives Nausea And Vomiting    REACTION: n/v   Current Outpatient Medications on File Prior to Visit  Medication Sig Dispense Refill  . albuterol  (PROVENTIL) (2.5 MG/3ML) 0.083% nebulizer solution Take 3 mLs (2.5 mg total) by nebulization every 4 (four) hours as needed for wheezing or shortness of breath. Reported on 06/04/2016 75 mL 2  . budesonide-formoterol (SYMBICORT) 160-4.5 MCG/ACT inhaler Inhale 2 puffs into the lungs 2 (two) times daily. 10.2 g 3  . carbamazepine (TEGRETOL) 200 MG tablet 1  qam   2   qhs (Patient taking differently: Take 200-400 mg by mouth 2 (two) times daily. 1  qam   2   qhs) 90 tablet 3  . DUPIXENT 300 MG/2ML SOSY INJECT 600MG  (2 SYRINGES)  SUBCUTANEOUSLY ON DAY 1,  300MG  (1 SYRINGE) DAY 15,  THEN EVERY OTHER WEEK  THEREAFTER 4 mL 11  . estradiol (CLIMARA - DOSED IN MG/24 HR) 0.05 mg/24hr patch Place 0.05 mg onto the skin once a week.    . fluticasone (FLONASE) 50 MCG/ACT nasal spray Place 2 sprays into both nostrils daily. 16 g 5  . guaiFENesin (TUSSIN PO) Take by mouth.    . hydrochlorothiazide (MICROZIDE) 12.5 MG capsule TAKE 1 CAPSULE(12.5 MG) BY MOUTH DAILY (Patient taking differently: Take 12.5 mg by mouth daily. ) 30 capsule 5  . ipratropium-albuterol (DUONEB) 0.5-2.5 (3) MG/3ML SOLN Take 3 mLs by nebulization every 4 (four) hours as needed. 360 mL 0  . irbesartan-hydrochlorothiazide (AVALIDE) 150-12.5 MG tablet TAKE 1 TABLET BY MOUTH EVERY DAY 90 tablet 0  . levocetirizine (XYZAL) 5 MG tablet TAKE 1 TABLET BY MOUTH EVERY DAY AS NEEDED FOR ALLERGIES (Patient taking differently: Take 5 mg by mouth daily. ) 30 tablet 3  . levofloxacin (LEVAQUIN) 750 MG tablet Take 1 tablet (750 mg total) by mouth daily. 3 tablet 0  . meclizine (ANTIVERT) 12.5 MG tablet Take 1 tablet (12.5 mg total) by mouth 3 (three) times daily as needed for dizziness. 30 tablet 1  . montelukast (SINGULAIR) 10 MG tablet Take 1 tablet (10 mg total) by mouth at bedtime. 30 tablet 5  . pantoprazole (PROTONIX) 40 MG tablet TAKE 1 TABLET BY MOUTH 30 TO 60 MINUTES BEFORE FIRST MEAL OF THE DAY AS DIRECTED 90 tablet 0  . pravastatin (PRAVACHOL) 40 MG  tablet TAKE 1 TABLET(40 MG) BY MOUTH DAILY (Patient taking differently: Take 40 mg by mouth daily. ) 90 tablet 1  . predniSONE (DELTASONE) 10 MG tablet Take 6 tablets (60 mg total) by mouth daily for 3 days, THEN 4 tablets (40 mg total) daily for 2 days, THEN 2 tablets (20 mg total) daily for 2 days, THEN 1 tablet (10 mg total) daily for 2 days, THEN 0.5 tablets (5 mg total) daily for 2 days. 23 tablet 0  . tiZANidine (ZANAFLEX) 4 MG tablet TAKE 1 TABLET(4 MG) BY MOUTH EVERY 6 HOURS AS NEEDED FOR MUSCLE SPASMS (Patient taking differently: Take 4 mg by mouth every 6 (six) hours as needed for muscle spasms. ) 60 tablet 0  . triamcinolone cream (KENALOG) 0.1 % APPLY EXTERNALLY TO THE AFFECTED AREA TWICE DAILY (Patient taking differently: Apply 1 application topically at bedtime. ) 30 g  0  . vitamin C (ASCORBIC ACID) 500 MG tablet Take 500 mg by mouth daily.     Current Facility-Administered Medications on File Prior to Visit  Medication Dose Route Frequency Provider Last Rate Last Dose  . omalizumab Arvid Right) injection 300 mg  300 mg Subcutaneous Q14 Days Valentina Shaggy, MD   300 mg at 12/30/18 1624   Review of Systems  Constitutional: Negative for other unusual diaphoresis or sweats HENT: Negative for ear discharge or swelling Eyes: Negative for other worsening visual disturbances Respiratory: Negative for stridor or other swelling  Gastrointestinal: Negative for worsening distension or other blood Genitourinary: Negative for retention or other urinary change Musculoskeletal: Negative for other MSK pain or swelling Skin: Negative for color change or other new lesions Neurological: Negative for worsening tremors and other numbness  Psychiatric/Behavioral: Negative for worsening agitation or other fatigue All other system neg per pt    Objective:   Physical Exam BP 116/70   Pulse 81   Temp 98.5 F (36.9 C) (Oral)   Ht 5\' 3"  (1.6 m)   Wt 183 lb (83 kg)   SpO2 96%   BMI 32.42 kg/m    VS noted, non toxic Constitutional: Pt appears in NAD HENT: Head: NCAT.  Right Ear: External ear normal.  Left Ear: External ear normal.  Eyes: . Pupils are equal, round, and reactive to light. Conjunctivae and EOM are normal Bilat tm's with mild erythema.  Max sinus areas mild tender.  Pharynx with mild erythema, no exudate Nose: without d/c or deformity Neck: Neck supple. Gross normal ROM Cardiovascular: Normal rate and regular rhythm.   Pulmonary/Chest: Effort normal and breath sounds decreased bilat without rales or wheezing.  Neurological: Pt is alert. At baseline orientation, motor grossly intact Skin: Skin is warm. No rashes, other new lesions, no LE edema Psychiatric: Pt behavior is normal without agitation  No other exam findings Lab Results  Component Value Date   WBC 21.6 (H) 01/09/2019   HGB 11.6 (L) 01/09/2019   HCT 37.2 01/09/2019   PLT 382 01/09/2019   GLUCOSE 87 01/09/2019   CHOL 139 06/23/2018   TRIG 52.0 06/23/2018   HDL 63.00 06/23/2018   LDLCALC 65 06/23/2018   ALT 27 01/08/2019   AST 16 01/08/2019   NA 138 01/09/2019   K 4.2 01/09/2019   CL 100 01/09/2019   CREATININE 0.75 01/09/2019   BUN 22 (H) 01/09/2019   CO2 29 01/09/2019   TSH 0.77 06/23/2018   HGBA1C 6.2 06/23/2018        Assessment & Plan:

## 2019-01-14 NOTE — Assessment & Plan Note (Signed)
Persistent, to still hold on goody powders, but will refer to ENT

## 2019-01-14 NOTE — Patient Instructions (Signed)
Please continue all other medications as before, including the few more days of levaquin and prednisone, as well as the rescue inhaler and symbicort  Please have the pharmacy call with any other refills you may need.  Please continue your efforts at being more active, low cholesterol diet, and weight control.  Please keep your appointments with your specialists as you may have planned - Dr Lamonte Sakai on Feb 4  You will be contacted regarding the referral for: ENT for the sinuses

## 2019-01-14 NOTE — Assessment & Plan Note (Signed)
Mild persistent, for Proair hfa refill, for antibx and prednisone for a few more days, f/u pulm as planned,  to f/u any worsening symptoms or concerns

## 2019-01-20 ENCOUNTER — Ambulatory Visit (INDEPENDENT_AMBULATORY_CARE_PROVIDER_SITE_OTHER): Payer: Medicare Other | Admitting: Nurse Practitioner

## 2019-01-20 ENCOUNTER — Encounter: Payer: Self-pay | Admitting: Nurse Practitioner

## 2019-01-20 DIAGNOSIS — J441 Chronic obstructive pulmonary disease with (acute) exacerbation: Secondary | ICD-10-CM

## 2019-01-20 NOTE — Patient Instructions (Addendum)
Resolving Asthma exacerbation: Please complete entire course of levaquin Please complete prednisone May take mucinex DM twice daily May take zyrtec  Daily controller medications: Continue Symbicort 160/4.5 mcg 2 puffs twice daily with spacer Spiriva 2.5 mcg 1 puff once daily Continue Xolair every 2 weeks - will be switching to dupixent per allergy note - hopefully this will help with better control  Rescue medications: Proventil 4 puffs every 4-6 hours as needed or albuterol nebulizer one vial every 4-6 hours as needed DUONeb added from ED - may take as needed  Please follow up with Dr. Lamonte Sakai in around 4-8 weeks or sooner if needed

## 2019-01-20 NOTE — Progress Notes (Signed)
@Patient  ID: [name], female    DOB: May 13, 1961, 58 y.o.   MRN: [id number]  Chief Complaint  Patient presents with  . Follow-up    Was in the hospital on 1/22 for SOB. Patient states that she has stopped smoking completely. PCP placed her back on abx and prednisone. States she is feeling much better.     Referring provider: Biagio Borg, MD  HPI 58 year old female current some day smoker with asthma/COPD overlap syndrome and perennial allergic rhinitis who is followed by Dr. Lamonte Sakai.  Patient is also followed by Dr. Ernst Bowler allergy specialist -patient has been on Xolair and recently switched to [place].  She has not taken her first dose of Dupixent yet.  Tests/recent events:  CXR 12/23/18>> no active cardiopulmonary disease  PFT Results Latest Ref Rng & Units 05/02/2018  FVC-Pre L 1.92  FVC-Predicted Pre % 75  Pre FEV1/FVC % % 55  FEV1-Pre L 1.06  FEV1-Predicted Pre % 52     levalbuterol (XOPENEX) nebulizer solution 0.63 mg    Date Action Dose Route User   Discharged on 01/10/2019   Admitted on 01/07/2019   01/06/2019 3532 Given 0.63 mg Nebulization Nena Polio, CMA    methylPREDNISolone acetate (DEPO-MEDROL) injection 80 mg    Date Action Dose Route User   Discharged on 01/10/2019   Admitted on 01/07/2019   Discharged on 01/03/2019   Admitted on 01/03/2019   12/09/2018 1046 Given 80 mg Intramuscular (Left Ventrogluteal) Valere Dross, CMA    omalizumab Arvid Right) injection 300 mg    Date Action Dose Route User   Discharged on 01/10/2019   Admitted on 01/07/2019   Discharged on 01/03/2019   Admitted on 01/03/2019   12/30/2018 1624 Given 300 mg Subcutaneous (Other) Horris Latino, CMA   12/12/2018 1040 Given 300 mg Subcutaneous (Other) Orlene Erm, LPN   99/24/2683 4196 Given 300 mg Subcutaneous (Other) Horris Latino, [place] 01/21/19 - Hospital follow up 01/07/19 - 01/10/19 Patient presents for hospital follow-up.  She was admitted with a  asthma/COPD exacerbation on 01/07/2019 after failing outpatient treatment with doxycycline and prednisone.. Was treated in the hospital with bronchodilators.  She was discharged home with breathing treatments, Levaquin, and prolonged steroid taper.  She has completed her Levaquin course.  She is still completing the prednisone taper.  She states that she is much improved.  She is compliant with Symbicort, duo nebs as needed, and Singulair.  Scheduled for her next Xolair injection next week.  She denies any recent fever.  She denies any shortness of breath, chest pain, wheezing, or edema.     Allergies  Allergen Reactions  . Codeine Nausea And Vomiting  . Sulfa Antibiotics Nausea And Vomiting  . Sulfonamide Derivatives Nausea And Vomiting    REACTION: n/v    Immunization History  Administered Date(s) Administered  . Influenza Split 10/23/2011  . Influenza Whole 09/16/2012  . Influenza,inj,Quad PF,6+ Mos 10/18/2014, 08/31/2015, 09/25/2016, 10/09/2017, 09/23/2018  . Pneumococcal Polysaccharide-23 12/02/2015  . Td 04/22/2014    Past Medical History:  Diagnosis Date  . ALLERGIC RHINITIS 05/12/2009   Qualifier: Diagnosis of  By: Lenn Cal Deborra Medina), Wynona Canes    . Anemia, iron deficiency 12/22/2014  . Angina   . Anxiety   . Arthritis   . Asthma   . ASTHMA 05/12/2009   Severe AFL (Spirometry 05/2009: pre-BD FEV1 0.87L 34% pred, post-BD FEV1 1.11L 44% pred) Volumes hyperinflated Decreased DLCO that does not fully correct  to normal range for alveolar volume.     Marland Kitchen COPD 08/24/2009   Qualifier: Diagnosis of  By: Burnett Kanaris    . COPD (chronic obstructive pulmonary disease) ([place])   . Depression   . Fibromyalgia 05/14/2014  . GERD (gastroesophageal reflux disease)   . Hyperlipidemia 04/20/2017  . HYPERTENSION 05/12/2009   Qualifier: Diagnosis of  By: Lenn Cal Deborra Medina), Wynona Canes    . Migraine   . Nephrolithiasis   . Peripheral vascular disease ([place])   . Prediabetes 02/23/2014  . Seizure ([place])   .  Seizure ([place])   . Urticaria     Tobacco History: Social History   Tobacco Use  Smoking Status Former Smoker  . Packs/day: 0.25  . Years: 36.00  . Pack years: 9.00  . Types: Cigarettes  . Last attempt to quit: 05/31/2018  . Years since quitting: 0.6  Smokeless Tobacco Never Used  Tobacco Comment   Passive smoker.  Patient states her quit date was 05/09/2018.  Patient educated with resources at today's appointment to continue to support her to stop smoking.   Counseling given: Not Answered Comment: Passive smoker.  Patient states her quit date was 05/09/2018.  Patient educated with resources at today's appointment to continue to support her to stop smoking.   Outpatient Encounter Medications as of 01/20/2019  Medication Sig  . albuterol (PROAIR HFA) 108 (90 Base) MCG/ACT inhaler Inhale 2 puffs into the lungs every 4 (four) hours as needed for wheezing or shortness of breath.  Marland Kitchen albuterol (PROVENTIL) (2.5 MG/3ML) 0.083% nebulizer solution Take 3 mLs (2.5 mg total) by nebulization every 4 (four) hours as needed for wheezing or shortness of breath. Reported on 06/04/2016  . budesonide-formoterol (SYMBICORT) 160-4.5 MCG/ACT inhaler Inhale 2 puffs into the lungs 2 (two) times daily.  . carbamazepine (TEGRETOL) 200 MG tablet 1  qam   2   qhs (Patient taking differently: Take 200-400 mg by mouth 2 (two) times daily. 1  qam   2   qhs)  . DUPIXENT 300 MG/2ML SOSY INJECT 600MG  (2 SYRINGES)  SUBCUTANEOUSLY ON DAY 1,  300MG  (1 SYRINGE) DAY 15,  THEN EVERY OTHER WEEK  THEREAFTER  . estradiol (CLIMARA - DOSED IN MG/24 HR) 0.05 mg/24hr patch Place 0.05 mg onto the skin once a week.  . fluticasone (FLONASE) 50 MCG/ACT nasal spray Place 2 sprays into both nostrils daily.  Marland Kitchen guaiFENesin (TUSSIN PO) Take by mouth.  . hydrochlorothiazide (MICROZIDE) 12.5 MG capsule TAKE 1 CAPSULE(12.5 MG) BY MOUTH DAILY (Patient taking differently: Take 12.5 mg by mouth daily. )  . ipratropium-albuterol (DUONEB) 0.5-2.5 (3)  MG/3ML SOLN Take 3 mLs by nebulization every 4 (four) hours as needed.  . irbesartan-hydrochlorothiazide (AVALIDE) 150-12.5 MG tablet TAKE 1 TABLET BY MOUTH EVERY DAY  . levocetirizine (XYZAL) 5 MG tablet TAKE 1 TABLET BY MOUTH EVERY DAY AS NEEDED FOR ALLERGIES (Patient taking differently: Take 5 mg by mouth daily. )  . levofloxacin (LEVAQUIN) 500 MG tablet Take 1 tablet (500 mg total) by mouth daily for 10 days.  . meclizine (ANTIVERT) 12.5 MG tablet Take 1 tablet (12.5 mg total) by mouth 3 (three) times daily as needed for dizziness.  . montelukast (SINGULAIR) 10 MG tablet Take 1 tablet (10 mg total) by mouth at bedtime.  . pantoprazole (PROTONIX) 40 MG tablet TAKE 1 TABLET BY MOUTH 30 TO 60 MINUTES BEFORE FIRST MEAL OF THE DAY AS DIRECTED  . pravastatin (PRAVACHOL) 40 MG tablet TAKE 1 TABLET(40 MG) BY MOUTH DAILY (Patient taking differently:  Take 40 mg by mouth daily. )  . predniSONE (DELTASONE) 10 MG tablet 2 tabs by mouth per day for 5 days  . tiZANidine (ZANAFLEX) 4 MG tablet TAKE 1 TABLET(4 MG) BY MOUTH EVERY 6 HOURS AS NEEDED FOR MUSCLE SPASMS (Patient taking differently: Take 4 mg by mouth every 6 (six) hours as needed for muscle spasms. )  . triamcinolone cream (KENALOG) 0.1 % APPLY EXTERNALLY TO THE AFFECTED AREA TWICE DAILY (Patient taking differently: Apply 1 application topically at bedtime. )  . vitamin C (ASCORBIC ACID) 500 MG tablet Take 500 mg by mouth daily.   Facility-Administered Encounter Medications as of 01/20/2019  Medication  . omalizumab Arvid Right) injection 300 mg     Review of Systems  Review of Systems  Constitutional: Negative.  Negative for chills and fever.  HENT: Negative.   Respiratory: Negative for cough, shortness of breath and wheezing.   Cardiovascular: Negative.  Negative for chest pain, palpitations and leg swelling.  Gastrointestinal: Negative.   Allergic/Immunologic: Negative.   Neurological: Negative.   Psychiatric/Behavioral: Negative.         Physical Exam  BP 126/82 (BP Location: Right Arm, Patient Position: Sitting, Cuff Size: Large)   Pulse 72   Ht 5\' 3"  (1.6 m)   Wt 184 lb 3.2 oz (83.6 kg)   SpO2 97%   BMI 32.63 kg/m   Wt Readings from Last 5 Encounters:  01/20/19 184 lb 3.2 oz (83.6 kg)  01/14/19 183 lb (83 kg)  01/07/19 179 lb 1.6 oz (81.2 kg)  01/06/19 182 lb (82.6 kg)  10/14/18 185 lb 0.6 oz (83.9 kg)     Physical Exam Vitals signs and nursing note reviewed.  Constitutional:      General: She is not in acute distress.    Appearance: She is well-developed.  Cardiovascular:     Rate and Rhythm: Normal rate and regular rhythm.  Pulmonary:     Effort: Pulmonary effort is normal. No respiratory distress.     Breath sounds: Normal breath sounds. No wheezing or rhonchi.  Musculoskeletal:        General: No swelling.  Neurological:     Mental Status: She is alert and oriented to person, place, and time.      Imaging: Dg Chest 2 View  Result Date: 01/07/2019 CLINICAL DATA:  58 year old female with increasing shortness of breath and wheezing. Recently diagnosed with pneumonia, started on steroids and antibiotics. EXAM: CHEST - 2 VIEW COMPARISON:  Chest radiographs 01/06/2019 and earlier. FINDINGS: Lower lung volumes with crowding of lung markings. Mediastinal contours are stable and within normal limits. Visualized tracheal air column is within normal limits. No pneumothorax or pleural effusion. No consolidation or confluent opacity identified today. No acute osseous abnormality identified. Prior cervical ACDF. Negative visible bowel gas pattern. IMPRESSION: Lower lung volumes today with no acute cardiopulmonary abnormality identified. Electronically Signed   By: Genevie Ann M.D.   On: 01/07/2019 11:56   Dg Chest 2 View  Result Date: 01/06/2019 CLINICAL DATA:  Shortness of breath, wheezing, productive cough EXAM: CHEST - 2 VIEW COMPARISON:  12/23/2018 FINDINGS: Mild retrocardiac/left lower lobe opacity. Mild  right basilar opacity. No pleural effusion or pneumothorax. The heart is normal in size. Cervical spine fixation hardware. IMPRESSION: Mild retrocardiac/left lower lobe opacity, atelectasis versus pneumonia. Mild right basilar opacity, likely atelectasis. Electronically Signed   By: Julian Hy M.D.   On: 01/06/2019 10:16   Dg Chest 2 View  Result Date: 12/23/2018 CLINICAL DATA:  Productive cough  for 1 month.  Asthma.  COPD. EXAM: CHEST - 2 VIEW COMPARISON:  10/05/2018 FINDINGS: The heart size and mediastinal contours are within normal limits. Aortic atherosclerosis. Both lungs are clear. Cervical spine fusion hardware again noted. IMPRESSION: No active cardiopulmonary disease. Electronically Signed   By: Earle Gell M.D.   On: 12/23/2018 15:23     Assessment & Plan:   COPD/asthma Patient is recovering well and exacerbation is resolved.  Patient Instructions  Resolving Asthma exacerbation: Please complete entire course of levaquin Please complete prednisone May take mucinex DM twice daily May take zyrtec  Daily controller medications: Continue Symbicort 160/4.5 mcg 2 puffs twice daily with spacer Spiriva 2.5 mcg 1 puff once daily Continue Xolair every 2 weeks - will be switching to dupixent per allergy note - hopefully this will help with better control  Rescue medications: Proventil 4 puffs every 4-6 hours as needed or albuterol nebulizer one vial every 4-6 hours as needed DUONeb added from ED - may take as needed  Please follow up with Dr. Lamonte Sakai in around 4-8 weeks or sooner if needed       Fenton Foy, NP 01/21/2019

## 2019-01-21 ENCOUNTER — Ambulatory Visit: Payer: Self-pay

## 2019-01-21 ENCOUNTER — Encounter: Payer: Self-pay | Admitting: Nurse Practitioner

## 2019-01-21 NOTE — Assessment & Plan Note (Signed)
Patient is recovering well and exacerbation is resolved.  Patient Instructions  Resolving Asthma exacerbation: Please complete entire course of levaquin Please complete prednisone May take mucinex DM twice daily May take zyrtec  Daily controller medications: Continue Symbicort 160/4.5 mcg 2 puffs twice daily with spacer Spiriva 2.5 mcg 1 puff once daily Continue Xolair every 2 weeks - will be switching to dupixent per allergy note - hopefully this will help with better control  Rescue medications: Proventil 4 puffs every 4-6 hours as needed or albuterol nebulizer one vial every 4-6 hours as needed DUONeb added from ED - may take as needed  Please follow up with Dr. Lamonte Sakai in around 4-8 weeks or sooner if needed

## 2019-02-02 ENCOUNTER — Telehealth: Payer: Self-pay

## 2019-02-02 DIAGNOSIS — G249 Dystonia, unspecified: Secondary | ICD-10-CM

## 2019-02-02 NOTE — Telephone Encounter (Signed)
OK, this is done

## 2019-02-02 NOTE — Telephone Encounter (Signed)
Copied from [place] [id number]. Topic: Referral - Request for Referral >> Feb 02, 2019 11:02 AM Yvette Rack wrote: Has patient seen PCP for this complaint? yes *If NO, is insurance requiring patient see PCP for this issue before PCP can refer them? Referral for which specialty: Neurology Preferred provider/office: [facility]  Reason for referral: migraines - Patient not happy with current neurologist Dr. Wells Guiles Tat and requests referral to [facility]

## 2019-02-02 NOTE — Addendum Note (Signed)
Addended by: Biagio Borg on: 02/02/2019 12:57 PM   Modules accepted: Orders

## 2019-02-06 ENCOUNTER — Other Ambulatory Visit: Payer: Self-pay | Admitting: Internal Medicine

## 2019-02-06 DIAGNOSIS — Z1231 Encounter for screening mammogram for malignant neoplasm of breast: Secondary | ICD-10-CM

## 2019-02-09 ENCOUNTER — Other Ambulatory Visit: Payer: Self-pay | Admitting: Obstetrics & Gynecology

## 2019-02-09 DIAGNOSIS — H35373 Puckering of macula, bilateral: Secondary | ICD-10-CM | POA: Diagnosis not present

## 2019-02-09 DIAGNOSIS — H5203 Hypermetropia, bilateral: Secondary | ICD-10-CM | POA: Diagnosis not present

## 2019-02-09 LAB — HM DIABETES EYE EXAM

## 2019-02-09 NOTE — Telephone Encounter (Signed)
annual exam scheduled on 04/28/19

## 2019-02-17 ENCOUNTER — Encounter: Payer: Self-pay | Admitting: Emergency Medicine

## 2019-02-17 ENCOUNTER — Ambulatory Visit (INDEPENDENT_AMBULATORY_CARE_PROVIDER_SITE_OTHER): Payer: Medicare Other | Admitting: *Deleted

## 2019-02-17 ENCOUNTER — Ambulatory Visit (INDEPENDENT_AMBULATORY_CARE_PROVIDER_SITE_OTHER): Payer: Medicare Other | Admitting: Emergency Medicine

## 2019-02-17 ENCOUNTER — Telehealth: Payer: Self-pay | Admitting: *Deleted

## 2019-02-17 DIAGNOSIS — F17200 Nicotine dependence, unspecified, uncomplicated: Secondary | ICD-10-CM

## 2019-02-17 DIAGNOSIS — J3089 Other allergic rhinitis: Secondary | ICD-10-CM | POA: Diagnosis not present

## 2019-02-17 DIAGNOSIS — J441 Chronic obstructive pulmonary disease with (acute) exacerbation: Secondary | ICD-10-CM | POA: Diagnosis not present

## 2019-02-17 DIAGNOSIS — J4540 Moderate persistent asthma, uncomplicated: Secondary | ICD-10-CM

## 2019-02-17 MED ORDER — ALBUTEROL SULFATE HFA 108 (90 BASE) MCG/ACT IN AERS: 2.0000 | INHALATION_SPRAY | RESPIRATORY_TRACT | 5 refills | Status: DC | PRN

## 2019-02-17 NOTE — Progress Notes (Signed)
Immunotherapy   Patient Details  Name: [name] MRN: [id number] Date of Birth: 1960/12/21  02/17/2019  [name] started injections for  DUPIXENT Frequency: 300mg  Every 2 weeks Epi-Pen:Epi-Pen Available  Consent signed and patient instructions given.  Patient started Dupixent today. Patient received 300mg  dose. Patient waited 30 minutes after injection and did not experience any issues.    Derric Dealmeida Fernandez-Vernon 02/17/2019, 10:38 AM

## 2019-02-17 NOTE — Patient Instructions (Addendum)
Congratulations on stopping smoking!  This is one of the most important thing you could do for your health.  Do not restart.  Please call us if you feel like you need help with this. Please continue Spiriva and Symbicort as you have been taking them.  Remember to rinse and gargle after you use the Symbicort. We will refill your albuterol, keep available to use 2 puffs up to every 4 hours if needed for shortness of breath, chest tightness, wheezing. Start Dupixent today as planned at your allergy office. Continue Singulair, Xyzal, fluticasone nasal spray as you have been taking them. Try starting nasal saline washes/rinses once daily to see if this helps with your chronic drainage, sinus symptoms. Follow with Dr Lamonte Sakai in 3 months or sooner if you have any problems.

## 2019-02-17 NOTE — Progress Notes (Signed)
Subjective:    Patient ID: [name], female    DOB: [birth year], 58 y.o.   MRN: [id number]  COPD  She complains of cough, shortness of breath and wheezing. Associated symptoms include chest pain. Pertinent negatives include no fever, postnasal drip or rhinorrhea. Her past medical history is significant for COPD.    ROV 02/11/18 --58 year old woman, active smoker (rare cigarettes now) with bipolar disorder, allergic rhinitis, COPD/asthma diagnosed around 2000.  Spirometry 05/2009 showed severe obstruction (34% predicted FEV1).  She has been managed with Xolair by her allergist, getting once a month.  I had not seen her since 11/16/16.  She was seen here by Dr. Vaughan Browner on 1/29 with symptoms consistent with an acute exacerbation. Finished abx and pred taper with good improvement. PCP changed her to Trelegy 1 month ago - she prefers it, believes that she has benefited from this.  She is starting to have a lot of nasal congestion and mucous especially in the am, associated with cough. She is on flonase and zyrtec, singulair. Has astelin NS available. Not on xyzal. She is using albuterol about 1x a day.                 ROV 02/17/2019 -- 58 year old woman with severe COPD with an asthmatic component, allergic rhinitis, also with bipolar disease.  Last seen by me about 1 year ago.  She has been on Xolair per her allergist, recently changed to [place] - first does to be today.  She was hospitalized in late January for an acute exacerbation after failing outpatient therapy.  The last time I saw her she had been on Trelegy but she is currently managed on Symbicort and Spiriva. She stopped smoking after that hospitalization!! She has albuterol, uses about 1x a day. Minimal cough. Remains on singulair, xyzal, fluticasone.    Past Medical History:  Diagnosis Date  . ALLERGIC RHINITIS 05/12/2009   Qualifier: Diagnosis of  By: Lenn Cal Deborra Medina), Wynona Canes    . Anemia, iron deficiency 12/22/2014  . Angina   .  Anxiety   . Arthritis   . Asthma   . ASTHMA 05/12/2009   Severe AFL (Spirometry 05/2009: pre-BD FEV1 0.87L 34% pred, post-BD FEV1 1.11L 44% pred) Volumes hyperinflated Decreased DLCO that does not fully correct to normal range for alveolar volume.     Marland Kitchen COPD 08/24/2009   Qualifier: Diagnosis of  By: Burnett Kanaris    . COPD (chronic obstructive pulmonary disease) ([place])   . Depression   . Fibromyalgia 05/14/2014  . GERD (gastroesophageal reflux disease)   . Hyperlipidemia 04/20/2017  . HYPERTENSION 05/12/2009   Qualifier: Diagnosis of  By: Lenn Cal Deborra Medina), Wynona Canes    . Migraine   . Nephrolithiasis   . Peripheral vascular disease ([place])   . Prediabetes 02/23/2014  . Seizure ([place])   . Seizure ([place])   . Urticaria       Review of Systems  Constitutional: Positive for fatigue. Negative for chills and fever.  HENT: Negative for postnasal drip, rhinorrhea and sinus pressure.   Respiratory: Positive for cough, shortness of breath and wheezing.   Cardiovascular: Positive for chest pain. Negative for palpitations and leg swelling.       Objective:   Physical Exam Vitals:   02/17/19 0921  BP: (!) 134/92  Pulse: 77  SpO2: 98%  Weight: 188 lb (85.3 kg)  Height: 5\' 3"  (1.6 m)  Gen: Pleasant, well-nourished, in no distress,  normal affect  ENT: No lesions,  mouth clear,  oropharynx clear, no postnasal drip  Neck: No JVD, no stridor  Lungs: No use of accessory muscles, very distant, no wheezing  Cardiovascular: RRR, heart sounds normal, no murmur or gallops, no peripheral edema  Musculoskeletal: No deformities, no cyanosis or clubbing  Neuro: alert, non focal  Skin: Warm, no lesions or rashes      Assessment & Plan:   COPD/asthma Please continue Spiriva and Symbicort as you have been taking them.  Remember to rinse and gargle after you use the Symbicort. We will refill your albuterol, keep available to use 2 puffs up to every 4 hours if needed for shortness of breath, chest  tightness, wheezing. Follow with Dr Lamonte Sakai in 3 months or sooner if you have any problems.  Perennial allergic rhinitis Start Dupixent today as planned at your allergy office. Continue Singulair, Xyzal, fluticasone nasal spray as you have been taking them. Try starting nasal saline washes/rinses once daily to see if this helps with your chronic drainage, sinus symptoms.  History of tobacco abuse Congratulations on stopping smoking!  This is one of the most important thing you could do for your health.  Do not restart.  Please call us if you feel like you need help with this.  Baltazar Apo, MD, PhD 02/17/2019, 9:49 AM [facility] [phone] or if no answer [phone]

## 2019-02-17 NOTE — Assessment & Plan Note (Signed)
Congratulations on stopping smoking!  This is one of the most important thing you could do for your health.  Do not restart.  Please call us if you feel like you need help with this.

## 2019-02-17 NOTE — Telephone Encounter (Signed)
FYI: Patient mentioned she will be getting a new insurance on 03/18/2019 she has not received new card yet

## 2019-02-17 NOTE — Assessment & Plan Note (Signed)
Start Dupixent today as planned at your allergy office. Continue Singulair, Xyzal, fluticasone nasal spray as you have been taking them. Try starting nasal saline washes/rinses once daily to see if this helps with your chronic drainage, sinus symptoms.

## 2019-02-17 NOTE — Assessment & Plan Note (Signed)
Please continue Spiriva and Symbicort as you have been taking them.  Remember to rinse and gargle after you use the Symbicort. We will refill your albuterol, keep available to use 2 puffs up to every 4 hours if needed for shortness of breath, chest tightness, wheezing. Follow with Dr Lamonte Sakai in 3 months or sooner if you have any problems.

## 2019-02-18 ENCOUNTER — Ambulatory Visit: Payer: Self-pay

## 2019-02-18 NOTE — Telephone Encounter (Signed)
Got it. Will try to check back beginning April to see if we have info

## 2019-02-18 NOTE — Telephone Encounter (Signed)
noted 

## 2019-03-03 ENCOUNTER — Other Ambulatory Visit: Payer: Self-pay

## 2019-03-03 ENCOUNTER — Ambulatory Visit (INDEPENDENT_AMBULATORY_CARE_PROVIDER_SITE_OTHER): Payer: Medicare Other | Admitting: *Deleted

## 2019-03-03 ENCOUNTER — Ambulatory Visit: Payer: Medicare Other

## 2019-03-03 DIAGNOSIS — J4540 Moderate persistent asthma, uncomplicated: Secondary | ICD-10-CM | POA: Diagnosis not present

## 2019-03-10 ENCOUNTER — Ambulatory Visit: Payer: Self-pay

## 2019-03-13 ENCOUNTER — Telehealth: Payer: Self-pay | Admitting: Neurology

## 2019-03-13 DIAGNOSIS — G40909 Epilepsy, unspecified, not intractable, without status epilepticus: Secondary | ICD-10-CM

## 2019-03-13 NOTE — Telephone Encounter (Signed)
[name], please remove patient from my schedule.  She called PCP stating that she was not happy with me and requested referral to [facility] and referral was sent to [place].  She will need referral to [facility] from PCP, and I copied him on the message.

## 2019-03-16 NOTE — Telephone Encounter (Signed)
Make sure that she is aware of the mistake so that she doesn't think that she has appt with [facility] that day.

## 2019-03-16 NOTE — Telephone Encounter (Signed)
I took her off of the sch  Thank you  Hinton Dyer

## 2019-03-16 NOTE — Telephone Encounter (Signed)
I left her a message to let her know

## 2019-03-17 ENCOUNTER — Other Ambulatory Visit: Payer: Self-pay

## 2019-03-17 ENCOUNTER — Ambulatory Visit (INDEPENDENT_AMBULATORY_CARE_PROVIDER_SITE_OTHER): Payer: Medicare Other | Admitting: *Deleted

## 2019-03-17 DIAGNOSIS — J4540 Moderate persistent asthma, uncomplicated: Secondary | ICD-10-CM | POA: Diagnosis not present

## 2019-03-18 ENCOUNTER — Ambulatory Visit (INDEPENDENT_AMBULATORY_CARE_PROVIDER_SITE_OTHER): Payer: Medicare HMO | Admitting: Psychiatry

## 2019-03-18 DIAGNOSIS — F3162 Bipolar disorder, current episode mixed, moderate: Secondary | ICD-10-CM

## 2019-03-18 MED ORDER — CARBAMAZEPINE 200 MG PO TABS: ORAL_TABLET | ORAL | 5 refills | Status: DC
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
## 2019-03-19 ENCOUNTER — Other Ambulatory Visit: Payer: Self-pay | Admitting: Allergy and Immunology

## 2019-03-19 ENCOUNTER — Other Ambulatory Visit: Payer: Self-pay | Admitting: Internal Medicine

## 2019-03-20 ENCOUNTER — Other Ambulatory Visit: Payer: Self-pay | Admitting: Internal Medicine

## 2019-03-31 ENCOUNTER — Ambulatory Visit: Payer: Self-pay | Admitting: Neurology

## 2019-03-31 ENCOUNTER — Ambulatory Visit: Payer: Self-pay

## 2019-04-01 ENCOUNTER — Other Ambulatory Visit: Payer: Self-pay

## 2019-04-01 MED ORDER — IRBESARTAN-HYDROCHLOROTHIAZIDE 150-12.5 MG PO TABS: 1.0000 | ORAL_TABLET | Freq: Every day | ORAL | 1 refills | Status: DC

## 2019-04-02 ENCOUNTER — Ambulatory Visit (INDEPENDENT_AMBULATORY_CARE_PROVIDER_SITE_OTHER): Payer: Medicare HMO | Admitting: *Deleted

## 2019-04-02 ENCOUNTER — Other Ambulatory Visit: Payer: Self-pay

## 2019-04-02 DIAGNOSIS — J4540 Moderate persistent asthma, uncomplicated: Secondary | ICD-10-CM

## 2019-04-06 ENCOUNTER — Other Ambulatory Visit: Payer: Self-pay | Admitting: Internal Medicine

## 2019-04-07 ENCOUNTER — Other Ambulatory Visit: Payer: Self-pay | Admitting: Allergy & Immunology

## 2019-04-07 ENCOUNTER — Other Ambulatory Visit: Payer: Self-pay | Admitting: Nurse Practitioner

## 2019-04-07 ENCOUNTER — Other Ambulatory Visit: Payer: Self-pay | Admitting: Internal Medicine

## 2019-04-07 ENCOUNTER — Other Ambulatory Visit: Payer: Self-pay | Admitting: Neurology

## 2019-04-08 ENCOUNTER — Other Ambulatory Visit: Payer: Self-pay | Admitting: Internal Medicine

## 2019-04-10 ENCOUNTER — Ambulatory Visit: Payer: Self-pay

## 2019-04-16 ENCOUNTER — Other Ambulatory Visit: Payer: Self-pay

## 2019-04-16 ENCOUNTER — Other Ambulatory Visit: Payer: Self-pay | Admitting: Allergy and Immunology

## 2019-04-16 ENCOUNTER — Ambulatory Visit (INDEPENDENT_AMBULATORY_CARE_PROVIDER_SITE_OTHER): Payer: Medicare HMO

## 2019-04-16 DIAGNOSIS — R69 Illness, unspecified: Secondary | ICD-10-CM | POA: Diagnosis not present

## 2019-04-16 DIAGNOSIS — J4540 Moderate persistent asthma, uncomplicated: Secondary | ICD-10-CM | POA: Diagnosis not present

## 2019-04-27 ENCOUNTER — Other Ambulatory Visit: Payer: Self-pay

## 2019-04-28 ENCOUNTER — Ambulatory Visit (INDEPENDENT_AMBULATORY_CARE_PROVIDER_SITE_OTHER): Payer: Medicare HMO | Admitting: Obstetrics & Gynecology

## 2019-04-28 ENCOUNTER — Encounter: Payer: Self-pay | Admitting: Obstetrics & Gynecology

## 2019-04-28 VITALS — BP 120/82 | Ht 63.0 in | Wt 183.0 lb

## 2019-04-28 DIAGNOSIS — L723 Sebaceous cyst: Secondary | ICD-10-CM | POA: Diagnosis not present

## 2019-04-28 DIAGNOSIS — E6609 Other obesity due to excess calories: Secondary | ICD-10-CM | POA: Diagnosis not present

## 2019-04-28 DIAGNOSIS — Z6832 Body mass index (BMI) 32.0-32.9, adult: Secondary | ICD-10-CM

## 2019-04-28 DIAGNOSIS — Z90710 Acquired absence of both cervix and uterus: Secondary | ICD-10-CM | POA: Diagnosis not present

## 2019-04-28 DIAGNOSIS — Z79890 Hormone replacement therapy (postmenopausal): Secondary | ICD-10-CM | POA: Diagnosis not present

## 2019-04-28 DIAGNOSIS — Z01419 Encounter for gynecological examination (general) (routine) without abnormal findings: Secondary | ICD-10-CM

## 2019-04-28 DIAGNOSIS — Z9189 Other specified personal risk factors, not elsewhere classified: Secondary | ICD-10-CM | POA: Diagnosis not present

## 2019-04-28 MED ORDER — ESTRADIOL 0.5 MG PO TABS: 0.5000 mg | ORAL_TABLET | Freq: Every day | ORAL | 4 refills | Status: DC

## 2019-04-28 NOTE — Progress Notes (Addendum)
[name] 11/13/1961 [id number]   History:    58 y.o. [id number] Single  RP:  Established patient presenting for annual gyn exam   HPI: S/P total Hysterectomy.  Symptomatic menopause with hot flushes.  Allergic to the glue of the Estradiol patch, would like to switch to a tablet form of HRT.  Severe hot flashes and night sweats when off of hormone replacement therapy.  No pelvic pain.  Not currently sexually active.  Breasts normal.  Body mass index 32.42.  Not exercising regularly.  Needs to improve nutrition.  Health labs with family physician.  Past medical history,surgical history, family history and social history were all reviewed and documented in the EPIC chart.  Gynecologic History No LMP recorded. Patient has had a hysterectomy. Contraception: status post hysterectomy Last Pap: 01/2018. Results were: Negative Last mammogram: 12/2017. Results were: Negative Bone Density: 01/2018 Normal Colonoscopy: 01/2012  Obstetric History OB History  Gravida Para Term Preterm AB Living  7 3 3   4 3   SAB TAB Ectopic Multiple Live Births  3 1          # Outcome Date GA Lbr Len/2nd Weight Sex Delivery Anes PTL Lv  7 TAB           6 SAB           5 SAB           4 SAB           3 Term           2 Term           1 Term              ROS: A ROS was performed and pertinent positives and negatives are included in the history.  GENERAL: No fevers or chills. HEENT: No change in vision, no earache, sore throat or sinus congestion. NECK: No pain or stiffness. CARDIOVASCULAR: No chest pain or pressure. No palpitations. PULMONARY: No shortness of breath, cough or wheeze. GASTROINTESTINAL: No abdominal pain, nausea, vomiting or diarrhea, melena or bright red blood per rectum. GENITOURINARY: No urinary frequency, urgency, hesitancy or dysuria. MUSCULOSKELETAL: No joint or muscle pain, no back pain, no recent trauma. DERMATOLOGIC: No rash, no itching, no lesions. ENDOCRINE: No polyuria,  polydipsia, no heat or cold intolerance. No recent change in weight. HEMATOLOGICAL: No anemia or easy bruising or bleeding. NEUROLOGIC: No headache, seizures, numbness, tingling or weakness. PSYCHIATRIC: No depression, no loss of interest in normal activity or change in sleep pattern.     Exam:   BP 120/82 (BP Location: Right Arm, Patient Position: Sitting, Cuff Size: Normal)   Ht 5\' 3"  (1.6 m)   Wt 183 lb (83 kg)   BMI 32.42 kg/m   Body mass index is 32.42 kg/m.  General appearance : Well developed well nourished female. No acute distress HEENT: Eyes: no retinal hemorrhage or exudates,  Neck supple, trachea midline, no carotid bruits, no thyroidmegaly Lungs: Clear to auscultation, no rhonchi or wheezes, or rib retractions  Heart: Regular rate and rhythm, no murmurs or gallops Breast:Examined in sitting and supine position were symmetrical in appearance, no palpable masses or tenderness,  no skin retraction, no nipple inversion, no nipple discharge, no skin discoloration, no axillary or supraclavicular lymphadenopathy Abdomen: no palpable masses or tenderness, no rebound or guarding Extremities: no edema or skin discoloration or tenderness  Pelvic: Vulva: Normal             Vagina:  No gross lesions or discharge  Cervix/Uterus absent  Adnexa  Without masses or tenderness  Anus: Normal   Assessment/Plan:  58 y.o. female for annual exam   1. Well female exam with routine gynecological exam Gynecologic exam status post total hysterectomy and menopause.  Last Pap test February 2019 was negative, no indication to repeat this year.  Breast exam normal.  Mammogram January 2019 was negative, patient scheduled for screening mammogram now.  Health labs with family physician.  2. S/P total hysterectomy  3. Postmenopausal hormone replacement therapy Symptomatic menopause.  Allergic to the glue on the estradiol patch.  Desires switching to a tablet form of hormone replacement therapy.  No  contraindication to continue on hormone replacement therapy.  Status post total hysterectomy.  Estradiol 0.5 mg/tab. 1 tablet daily prescribed.  Usage, risks and benefits reviewed.  4. Sebaceous cyst Warm soaks daily.  Clindagel 1% topical application as needed.  5. Class 1 obesity due to excess calories without serious comorbidity with body mass index (BMI) of 32.0 to 32.9 in adult Recommend a lower calorie/carb diet such as [employer].  Increase physical activity with aerobic physical activities 5 times a week and weight lifting every 2 days.  Other orders - estradiol (ESTRACE) 0.5 MG tablet; Take 1 tablet (0.5 mg total) by mouth daily. - clindamycin (CLINDAGEL) 1 % gel; Apply topically daily as needed.   Princess Bruins MD, 11:18 AM 04/28/2019

## 2019-04-28 NOTE — Patient Instructions (Signed)
1. Well female exam with routine gynecological exam Gynecologic exam status post total hysterectomy and menopause.  Last Pap test February 2019 was negative, no indication to repeat this year.  Breast exam normal.  Mammogram January 2019 was negative, patient scheduled for screening mammogram now.  Health labs with family physician.  2. S/P total hysterectomy  3. Postmenopausal hormone replacement therapy Symptomatic menopause.  Allergic to the glue on the estradiol patch.  Desires switching to a tablet form of hormone replacement therapy.  No contraindication to continue on hormone replacement therapy.  Status post total hysterectomy.  Estradiol 0.5 mg/tab. 1 tablet daily prescribed.  Usage, risks and benefits reviewed.  4. Class 1 obesity due to excess calories without serious comorbidity with body mass index (BMI) of 32.0 to 32.9 in adult Recommend a lower calorie/carb diet such as [employer].  Increase physical activity with aerobic physical activities 5 times a week and weight lifting every 2 days.  Other orders - estradiol (ESTRACE) 0.5 MG tablet; Take 1 tablet (0.5 mg total) by mouth daily.  [name], it was a pleasure seeing you today!

## 2019-04-29 ENCOUNTER — Telehealth: Payer: Self-pay | Admitting: *Deleted

## 2019-04-29 MED ORDER — CLINDAMYCIN PHOSPHATE 1 % EX GEL: Freq: Every day | CUTANEOUS | 2 refills | Status: DC | PRN

## 2019-04-29 NOTE — Telephone Encounter (Signed)
Sending Clinda cream now.

## 2019-04-29 NOTE — Addendum Note (Signed)
Addended by: Princess Bruins on: 04/29/2019 12:45 PM   Modules accepted: Orders

## 2019-04-29 NOTE — Telephone Encounter (Signed)
Patient was seen for annual exam yesterday went to pharmacy to pickup Rx's estradiol was there, but not the cream for the boil you were going to prescribe? Please advise

## 2019-04-30 ENCOUNTER — Ambulatory Visit: Payer: Self-pay

## 2019-04-30 NOTE — Telephone Encounter (Signed)
Patient picked up Rx

## 2019-05-08 ENCOUNTER — Telehealth: Payer: Self-pay | Admitting: Internal Medicine

## 2019-05-08 MED ORDER — AMOXICILLIN 875 MG PO TABS: 875.0000 mg | ORAL_TABLET | Freq: Two times a day (BID) | ORAL | 0 refills | Status: AC

## 2019-05-08 MED ORDER — PREDNISONE 10 MG PO TABS: 20.0000 mg | ORAL_TABLET | Freq: Every day | ORAL | 0 refills | Status: AC

## 2019-05-08 NOTE — Telephone Encounter (Signed)
Called & spoke w/ pt regarding TN's measures and recommendations. Pt verbalized understanding with no additional questions. I made sure to emphasize that if her symptoms do worsen over the weekend to seek emergency care. Pt expressed understanding. Nothing further needed at this time.

## 2019-05-08 NOTE — Telephone Encounter (Signed)
Kenney Houseman saw patient last, routing to her

## 2019-05-08 NOTE — Telephone Encounter (Signed)
I have sent in amoxicillin and prednisone. If symptoms worsen over the weekend please go to ED or urgent care.

## 2019-05-08 NOTE — Telephone Encounter (Signed)
Primary Pulmonologist: Dr. Lamonte Sakai Last office visit and with whom: 02/17/2019 with RB What do we see them for (pulmonary problems): COPD/asthma; perennial allergic rhinitis, Hx of smoking   Reason for call: Pt states she went to the grocery store yesterday and took all necessary precaution. States she woke up this morning 05/08/2019 with chest tightness, pain in the posterior lower lung, cough w/ clear mucus, sore throat, and sweats. Pt does not have a device to take her temperature, but states she "feels hot." Denies wheezing. Denies SOB. Pt states since she quit smoking 01/21/2019, her breathing has been better. Pt takes Symbicort 160 2 puffs twice daily. Has albuterol rescue inhaler and neb treatments, but states she hasn't had to use them. Pt also states she's gargled salt water for her sore throat, which she states has helped.   In the last month, have you been in contact with someone who was confirmed or suspected to have [place]avirus / COVID-19?  No  Do you have any of the following symptoms developed in the last 30 days? Fever: Inconclusive, pt states she cannot take her temperature at this time, but she "feels hot." Cough: Yes, nonproductive w/ clear mucus.  Shortness of breath: No  When did your symptoms start?  05/08/2019 this morning when she woke up  If the patient has a fever, what is the last reading?  (use n/a if patient denies fever)  Pt does not have a thermometer.  . IF THE PATIENT STATES THEY DO NOT OWN A THERMOMETER, THEY MUST GO AND PURCHASE ONE When did the fever start?: Pt does not have a thermometer Have you taken any medication to suppress a fever (ie Ibuprofen, Aleve, Tylenol)?: No  [name], please advise on your recommendations for this patient. Thank you.

## 2019-05-20 ENCOUNTER — Telehealth: Payer: Self-pay | Admitting: *Deleted

## 2019-05-20 NOTE — Telephone Encounter (Signed)
Called patient  to advise since she needs in office visit, we have to reschedule to a Tues afternoon. She agreed, and we rescheduled. I explained in office check in procedure. She had no questions,  verbalized understanding, appreciation.

## 2019-05-25 ENCOUNTER — Ambulatory Visit: Payer: Medicare Other | Admitting: Diagnostic Neuroimaging

## 2019-05-26 DIAGNOSIS — K219 Gastro-esophageal reflux disease without esophagitis: Secondary | ICD-10-CM | POA: Diagnosis not present

## 2019-05-26 DIAGNOSIS — J3489 Other specified disorders of nose and nasal sinuses: Secondary | ICD-10-CM | POA: Insufficient documentation

## 2019-05-26 DIAGNOSIS — J309 Allergic rhinitis, unspecified: Secondary | ICD-10-CM | POA: Diagnosis not present

## 2019-05-29 ENCOUNTER — Ambulatory Visit (INDEPENDENT_AMBULATORY_CARE_PROVIDER_SITE_OTHER): Payer: Medicare HMO | Admitting: *Deleted

## 2019-05-29 DIAGNOSIS — J4540 Moderate persistent asthma, uncomplicated: Secondary | ICD-10-CM

## 2019-06-01 ENCOUNTER — Ambulatory Visit: Payer: Self-pay

## 2019-06-02 DIAGNOSIS — J329 Chronic sinusitis, unspecified: Secondary | ICD-10-CM | POA: Diagnosis not present

## 2019-06-02 DIAGNOSIS — J3489 Other specified disorders of nose and nasal sinuses: Secondary | ICD-10-CM | POA: Diagnosis not present

## 2019-06-02 DIAGNOSIS — J309 Allergic rhinitis, unspecified: Secondary | ICD-10-CM | POA: Diagnosis not present

## 2019-06-04 ENCOUNTER — Other Ambulatory Visit: Payer: Self-pay | Admitting: Obstetrics & Gynecology

## 2019-06-09 ENCOUNTER — Other Ambulatory Visit: Payer: Self-pay

## 2019-06-09 ENCOUNTER — Encounter

## 2019-06-09 ENCOUNTER — Ambulatory Visit (INDEPENDENT_AMBULATORY_CARE_PROVIDER_SITE_OTHER): Payer: Medicare HMO | Admitting: Diagnostic Neuroimaging

## 2019-06-09 ENCOUNTER — Encounter: Payer: Self-pay | Admitting: Diagnostic Neuroimaging

## 2019-06-09 VITALS — BP 124/82 | HR 72 | Temp 97.0°F | Ht 63.0 in | Wt 179.2 lb

## 2019-06-09 DIAGNOSIS — E236 Other disorders of pituitary gland: Secondary | ICD-10-CM | POA: Diagnosis not present

## 2019-06-09 DIAGNOSIS — R519 Headache, unspecified: Secondary | ICD-10-CM

## 2019-06-09 DIAGNOSIS — R51 Headache: Secondary | ICD-10-CM

## 2019-06-09 DIAGNOSIS — E237 Disorder of pituitary gland, unspecified: Secondary | ICD-10-CM | POA: Diagnosis not present

## 2019-06-09 DIAGNOSIS — G40909 Epilepsy, unspecified, not intractable, without status epilepticus: Secondary | ICD-10-CM | POA: Diagnosis not present
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
## 2019-06-10 ENCOUNTER — Telehealth: Payer: Self-pay | Admitting: Diagnostic Neuroimaging

## 2019-06-10 NOTE — Telephone Encounter (Signed)
Mcarthur Rossetti Josem Kaufmann: [id number] (exp. 06/10/19 to 07/10/19) patient has medicaid GI obtains the auth for that. Patient is scheduled at GI for 07/01/19.

## 2019-06-12 ENCOUNTER — Other Ambulatory Visit: Payer: Self-pay

## 2019-06-12 ENCOUNTER — Ambulatory Visit (INDEPENDENT_AMBULATORY_CARE_PROVIDER_SITE_OTHER): Payer: Medicare HMO | Admitting: *Deleted

## 2019-06-12 ENCOUNTER — Other Ambulatory Visit: Payer: Self-pay | Admitting: Allergy & Immunology

## 2019-06-12 DIAGNOSIS — J4540 Moderate persistent asthma, uncomplicated: Secondary | ICD-10-CM | POA: Diagnosis not present

## 2019-06-15 ENCOUNTER — Other Ambulatory Visit: Payer: Self-pay | Admitting: Internal Medicine

## 2019-06-15 ENCOUNTER — Other Ambulatory Visit: Payer: Self-pay | Admitting: Emergency Medicine

## 2019-06-15 MED ORDER — ALBUTEROL SULFATE HFA 108 (90 BASE) MCG/ACT IN AERS: INHALATION_SPRAY | RESPIRATORY_TRACT | 0 refills | Status: DC

## 2019-06-24 ENCOUNTER — Telehealth: Payer: Self-pay

## 2019-06-24 ENCOUNTER — Ambulatory Visit: Payer: Medicaid Other

## 2019-06-24 NOTE — Telephone Encounter (Signed)
Since this came back to me, I am not sure it went to the [place] testing pool, thanks

## 2019-06-24 NOTE — Telephone Encounter (Signed)
Copied from [place] [id number]. Topic: Appointment Scheduling - Scheduling Inquiry for Clinic >> Jun 24, 2019 10:29 AM Rainey Pines A wrote: Patient would like a callback from nurse in regards to getting covid test. She was around a friend of a friend that tested positive.

## 2019-06-24 NOTE — Telephone Encounter (Signed)
Please schedule pt to be tested per PCP. Thanks!

## 2019-06-24 NOTE — Telephone Encounter (Signed)
Ok to refer for testing

## 2019-06-25 ENCOUNTER — Telehealth: Payer: Self-pay | Admitting: General Practice

## 2019-06-25 ENCOUNTER — Other Ambulatory Visit: Payer: Self-pay

## 2019-06-25 DIAGNOSIS — Z20822 Contact with and (suspected) exposure to covid-19: Secondary | ICD-10-CM

## 2019-06-25 DIAGNOSIS — R6889 Other general symptoms and signs: Secondary | ICD-10-CM | POA: Diagnosis not present

## 2019-06-25 NOTE — Addendum Note (Signed)
Addended by: Dimple Nanas on: 06/25/2019 11:54 AM   Modules accepted: Orders

## 2019-06-25 NOTE — Telephone Encounter (Signed)
Pt has been scheduled for covid testing.  ° °

## 2019-06-25 NOTE — Telephone Encounter (Signed)
Pt has been scheduled for covid testing.  Pt was referred by: Biagio Borg, MD

## 2019-06-26 ENCOUNTER — Ambulatory Visit: Payer: Self-pay

## 2019-06-29 LAB — NOVEL CORONAVIRUS, NAA: SARS-CoV-2, NAA: NOT DETECTED

## 2019-06-30 ENCOUNTER — Encounter: Payer: Self-pay | Admitting: Internal Medicine

## 2019-07-01 ENCOUNTER — Other Ambulatory Visit: Payer: Medicaid Other

## 2019-07-03 ENCOUNTER — Encounter: Payer: Self-pay | Admitting: Family Medicine

## 2019-07-03 ENCOUNTER — Ambulatory Visit (INDEPENDENT_AMBULATORY_CARE_PROVIDER_SITE_OTHER): Payer: Medicare HMO | Admitting: Family Medicine

## 2019-07-03 ENCOUNTER — Other Ambulatory Visit: Payer: Self-pay

## 2019-07-03 VITALS — BP 120/82 | Resp 16 | Ht 63.0 in | Wt 175.0 lb

## 2019-07-03 DIAGNOSIS — L501 Idiopathic urticaria: Secondary | ICD-10-CM | POA: Diagnosis not present

## 2019-07-03 DIAGNOSIS — J449 Chronic obstructive pulmonary disease, unspecified: Secondary | ICD-10-CM | POA: Diagnosis not present

## 2019-07-03 DIAGNOSIS — J4489 Other specified chronic obstructive pulmonary disease: Secondary | ICD-10-CM

## 2019-07-03 DIAGNOSIS — J3089 Other allergic rhinitis: Secondary | ICD-10-CM

## 2019-07-03 DIAGNOSIS — L508 Other urticaria: Secondary | ICD-10-CM

## 2019-07-03 MED ORDER — LEVOCETIRIZINE DIHYDROCHLORIDE 5 MG PO TABS: 5.0000 mg | ORAL_TABLET | Freq: Two times a day (BID) | ORAL | 3 refills | Status: DC | PRN

## 2019-07-03 MED ORDER — FAMOTIDINE 20 MG PO TABS: 20.0000 mg | ORAL_TABLET | Freq: Two times a day (BID) | ORAL | 5 refills | Status: DC

## 2019-07-03 NOTE — Patient Instructions (Addendum)
Asthma/COPD overlap syndrome Continue Symbicort 160-2 puffs twice a day to prevent cough and wheeze Continue Spiriva 2.5 mcg 2 puffs once a day Continue montelukast 10 mg once a day to prevent cough or wheeze Albuterol 2 puffs every 4 hours as needed for cough or wheeze Continue Dupixent 100 mg once every 2 weeks  Chronic urticaria Continue Xyzal twice a day for itch Begin famotidine 20 mg twice a day for itch Continue montelukast (as above) Triamcinolone 0.1% ointment to red, itchy twice a day as needed to areas below your face Prednisone 10 mg tablets. Take 2 tablets twice a day for 4 days, then take 1 tablet on the 5th day, then stop  Allergic rhinitis Continue Flonase 2 sprays in each nostril once a day as needed for a stuffy nose Consider nasal saline rinses as needed for nasal symptoms.   Reflux Continue Protonix once a day as previously prescribed Continue dietary and lifestyle modifications as listed below  Call the clinic if this treatment plan is not working well for you  Follow up in 1 month or sooner if needed   Lifestyle Changes for Controlling GERD When you have GERD, stomach acid feels as if it's backing up toward your mouth. Whether or not you take medication to control your GERD, your symptoms can often be improved with lifestyle changes.   Raise Your Head  Reflux is more likely to strike when you're lying down flat, because stomach fluid can  flow backward more easily. Raising the head of your bed 4-6 inches can help. To do this:  Slide blocks or books under the legs at the head of your bed. Or, place a wedge under  the mattress. Many foam stores can make a suitable wedge for you. The wedge  should run from your waist to the top of your head.  Don't just prop your head on several pillows. This increases pressure on your  stomach. It can make GERD worse.  Watch Your Eating Habits Certain foods may increase the acid in your stomach or relax the lower  esophageal sphincter, making GERD more likely. It's best to avoid the following:  Coffee, tea, and carbonated drinks (with and without caffeine)  Fatty, fried, or spicy food  Mint, chocolate, onions, and tomatoes  Any other foods that seem to irritate your stomach or cause you pain  Relieve the Pressure  Eat smaller meals, even if you have to eat more often.  Don't lie down right after you eat. Wait a few hours for your stomach to empty.  Avoid tight belts and tight-fitting clothes.  Lose excess weight.  Tobacco and Alcohol  Avoid smoking tobacco and drinking alcohol. They can make GERD symptoms worse.

## 2019-07-03 NOTE — Progress Notes (Signed)
[place] [address] Dept: [phone]  FOLLOW UP NOTE  Patient ID: [name], female    DOB: 07-08-1961  Age: 58 y.o. MRN: [id number] Date of Office Visit: 07/03/2019  Assessment  Chief Complaint: Pruritis (over a week)  HPI [name] is a 58 year old female wo presents to the clinic for an acute visit. She was last seen in this clinic on 12/23/2018 by Dr. Ernst Bowler for evaluation of asthma/COPD overlap, allergic rhinitis, chronic urticaria, and cough. At today's visit, she reports that she began itching on all parts of her body on Monday. She denies hives or rash. She reports that she ate seafood over the weekend which occasionally causes her to itch. She denies cardiopulmonary or gastrointestinal symptoms with the itching. She is not currently taking any antihistamines. Asthma/COPD is reported as well controlled with some shortness of breath with activity, no cough and no wheeze. She continues Symbicort 160-2 puffs twice a day, montelukast 10 mg once a day, Spiriva 2.5 mcg 2 puffs twice a day, and albuterol 1-2 times a week for rescue. She continues albuterol before exercise. Allergic rhinitis is reported as well controlled with no medical intervention. Reflux is reported as well controlled with Protonix once a day. Her current medications are listed in the chart.    Drug Allergies:  Allergies  Allergen Reactions  . Codeine Nausea And Vomiting  . Sulfa Antibiotics Nausea And Vomiting  . Sulfonamide Derivatives Nausea And Vomiting    REACTION: n/v    Physical Exam: BP 120/82 (BP Location: Left Arm, Patient Position: Sitting)   Resp 16   Ht 5\' 3"  (1.6 m)   Wt 175 lb (79.4 kg)   SpO2 97%   BMI 31.00 kg/m    Physical Exam Vitals signs reviewed.  Constitutional:      Appearance: Normal appearance.  HENT:     Head: Normocephalic and atraumatic.     Right Ear: Tympanic membrane normal.     Left Ear: Tympanic membrane normal.     Nose:      Comments: Bilateral nares slightly erythematous with clear nasal drainage noted.  Pharynx normal.  Ears normal.  Eyes normal.    Mouth/Throat:     Pharynx: Oropharynx is clear.  Eyes:     Conjunctiva/sclera: Conjunctivae normal.  Neck:     Musculoskeletal: Normal range of motion and neck supple.  Cardiovascular:     Rate and Rhythm: Normal rate and regular rhythm.     Heart sounds: Normal heart sounds. No murmur.  Pulmonary:     Effort: Pulmonary effort is normal.     Breath sounds: Normal breath sounds.     Comments: Lungs clear to auscultation Musculoskeletal: Normal range of motion.  Skin:    General: Skin is warm and dry.     Comments: No rash or hives noted  Neurological:     Mental Status: She is alert and oriented to person, place, and time.  Psychiatric:        Mood and Affect: Mood normal.        Behavior: Behavior normal.        Thought Content: Thought content normal.        Judgment: Judgment normal.     Diagnostics: FVC 1.37, FEV1 0.58.  Predicted FVC 2.61, predicted FEV1 2.09.  Spirometry indicates moderate restriction and severe airway obstruction.  Postbronchodilator spirometry FVC 0.69, FEV1 0.72.  Postbronchodilator spirometry indicates moderate restriction and severe airway obstruction.  Post bronchodilator  FEV1 increased by 28% in FVC increased 52%.  Assessment and Plan: 1. Asthma-COPD overlap syndrome ([place])   2. Chronic urticaria   3. Perennial allergic rhinitis   4. Idiopathic urticaria/pruritus     Meds ordered this encounter  Medications  . famotidine (PEPCID) 20 MG tablet    Sig: Take 1 tablet (20 mg total) by mouth 2 (two) times daily.    Dispense:  64 tablet    Refill:  5  . levocetirizine (XYZAL) 5 MG tablet    Sig: Take 1 tablet (5 mg total) by mouth 2 (two) times daily as needed for allergies (itching).    Dispense:  30 tablet    Refill:  3    Patient Instructions  Asthma/COPD overlap syndrome Continue Symbicort 160-2 puffs twice a  day to prevent cough and wheeze Continue Spiriva 2.5 mcg 2 puffs once a day Continue montelukast 10 mg once a day to prevent cough or wheeze Albuterol 2 puffs every 4 hours as needed for cough or wheeze Continue Dupixent 100 mg once every 2 weeks  Chronic urticaria Continue Xyzal twice a day for itch Begin famotidine 20 mg twice a day for itch Continue montelukast (as above) Triamcinolone 0.1% ointment to red, itchy twice a day as needed to areas below your face Prednisone 10 mg tablets. Take 2 tablets twice a day for 4 days, then take 1 tablet on the 5th day, then stop  Allergic rhinitis Continue Flonase 2 sprays in each nostril once a day as needed for a stuffy nose Consider nasal saline rinses as needed for nasal symptoms.   Reflux Continue Protonix once a day as previously prescribed Continue dietary and lifestyle modifications as listed below  Call the clinic if this treatment plan is not working well for you  Follow up in 1 month or sooner if needed   Return in about 4 weeks (around 07/31/2019), or if symptoms worsen or fail to improve.    Thank you for the opportunity to care for this patient.  Please do not hesitate to contact me with questions.  Gareth Morgan, FNP [facility]

## 2019-07-07 ENCOUNTER — Telehealth: Payer: Self-pay

## 2019-07-07 NOTE — Telephone Encounter (Signed)
Fax from [facility]:  xyzal generic 1 tab BID not covered Attempted to complete a PA through [place] [facility], unable to complete it as it would not give an option for the tablet.   Call to patient to explain that we are not able to do a PA for 2 tablets daily.  Insurance will only cover one tablet daily.  Pt will buy the extra xyzal OTC.  Pt was agreeable to this.  No further questions.

## 2019-07-11 ENCOUNTER — Other Ambulatory Visit: Payer: Self-pay | Admitting: Allergy & Immunology

## 2019-07-13 ENCOUNTER — Other Ambulatory Visit: Payer: Self-pay

## 2019-07-13 NOTE — Telephone Encounter (Signed)
RF on Symbicort x 1 with 5 refills at [facility]

## 2019-07-20 ENCOUNTER — Ambulatory Visit (INDEPENDENT_AMBULATORY_CARE_PROVIDER_SITE_OTHER): Payer: Medicare HMO | Admitting: *Deleted

## 2019-07-20 ENCOUNTER — Other Ambulatory Visit: Payer: Self-pay

## 2019-07-20 DIAGNOSIS — J4540 Moderate persistent asthma, uncomplicated: Secondary | ICD-10-CM | POA: Diagnosis not present

## 2019-07-20 MED ORDER — DUPILUMAB 300 MG/2ML ~~LOC~~ SOSY
300.0000 mg | PREFILLED_SYRINGE | SUBCUTANEOUS | Status: DC
  Administered 2019-07-20 – 2020-05-26 (×21): 300 mg via SUBCUTANEOUS

## 2019-07-21 ENCOUNTER — Ambulatory Visit
Admission: RE | Admit: 2019-07-21 | Discharge: 2019-07-21 | Disposition: A | Discharge status: Home / Self Care | Payer: Medicare HMO | Source: Ambulatory Visit | Attending: Diagnostic Neuroimaging | Admitting: Diagnostic Neuroimaging

## 2019-07-21 DIAGNOSIS — D352 Benign neoplasm of pituitary gland: Secondary | ICD-10-CM | POA: Diagnosis not present

## 2019-07-21 DIAGNOSIS — E236 Other disorders of pituitary gland: Secondary | ICD-10-CM | POA: Diagnosis not present

## 2019-07-21 MED ORDER — GADOBENATE DIMEGLUMINE 529 MG/ML IV SOLN
10.0000 mL | Freq: Once | INTRAVENOUS | Status: AC | PRN
  Administered 2019-07-21: 10 mL via INTRAVENOUS

## 2019-07-22 ENCOUNTER — Ambulatory Visit (INDEPENDENT_AMBULATORY_CARE_PROVIDER_SITE_OTHER): Payer: Medicare HMO | Admitting: Psychiatry

## 2019-07-22 ENCOUNTER — Other Ambulatory Visit: Payer: Self-pay

## 2019-07-22 DIAGNOSIS — F3162 Bipolar disorder, current episode mixed, moderate: Secondary | ICD-10-CM

## 2019-07-22 MED ORDER — CARBAMAZEPINE 200 MG PO TABS: ORAL_TABLET | ORAL | 5 refills | Status: DC
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
## 2019-07-29 ENCOUNTER — Other Ambulatory Visit: Payer: Self-pay | Admitting: Emergency Medicine

## 2019-07-29 MED ORDER — ALBUTEROL SULFATE HFA 108 (90 BASE) MCG/ACT IN AERS: INHALATION_SPRAY | RESPIRATORY_TRACT | 0 refills | Status: DC

## 2019-07-30 ENCOUNTER — Other Ambulatory Visit: Payer: Self-pay

## 2019-07-30 MED ORDER — IRBESARTAN-HYDROCHLOROTHIAZIDE 150-12.5 MG PO TABS: 1.0000 | ORAL_TABLET | Freq: Every day | ORAL | 1 refills | Status: DC

## 2019-07-31 ENCOUNTER — Other Ambulatory Visit (HOSPITAL_COMMUNITY): Payer: Self-pay

## 2019-07-31 DIAGNOSIS — Z79899 Other long term (current) drug therapy: Secondary | ICD-10-CM | POA: Diagnosis not present

## 2019-08-01 LAB — CBC WITH DIFFERENTIAL/PLATELET
Basophils Absolute: 0 10*3/uL (ref 0.0–0.2)
Basos: 0 %
EOS (ABSOLUTE): 0.2 10*3/uL (ref 0.0–0.4)
Eos: 2 %
Hematocrit: 39.6 % (ref 34.0–46.6)
Hemoglobin: 12.8 g/dL (ref 11.1–15.9)
Immature Grans (Abs): 0 10*3/uL (ref 0.0–0.1)
Immature Granulocytes: 0 %
Lymphocytes Absolute: 3.2 10*3/uL — ABNORMAL HIGH (ref 0.7–3.1)
Lymphs: 32 %
MCH: 30.3 pg (ref 26.6–33.0)
MCHC: 32.3 g/dL (ref 31.5–35.7)
MCV: 94 fL (ref 79–97)
Monocytes Absolute: 0.9 10*3/uL (ref 0.1–0.9)
Monocytes: 9 %
Neutrophils Absolute: 5.7 10*3/uL (ref 1.4–7.0)
Neutrophils: 57 %
Platelets: 356 10*3/uL (ref 150–450)
RBC: 4.23 x10E6/uL (ref 3.77–5.28)
RDW: 14.5 % (ref 11.7–15.4)
WBC: 10 10*3/uL (ref 3.4–10.8)

## 2019-08-01 LAB — COMPREHENSIVE METABOLIC PANEL
ALT: 15 IU/L (ref 0–32)
AST: 19 IU/L (ref 0–40)
Albumin/Globulin Ratio: 2 (ref 1.2–2.2)
Albumin: 4.3 g/dL (ref 3.8–4.9)
Alkaline Phosphatase: 91 IU/L (ref 39–117)
BUN/Creatinine Ratio: 17 (ref 9–23)
BUN: 11 mg/dL (ref 6–24)
Bilirubin Total: <0.2 mg/dL (ref 0.0–1.2)
CO2: 25 mmol/L (ref 20–29)
Calcium: 8.9 mg/dL (ref 8.7–10.2)
Chloride: 102 mmol/L (ref 96–106)
Creatinine, Ser: 0.64 mg/dL (ref 0.57–1.00)
GFR calc Af Amer: 115 mL/min/{1.73_m2} (ref >59)
GFR calc non Af Amer: 100 mL/min/{1.73_m2} (ref >59)
Globulin, Total: 2.2 g/dL (ref 1.5–4.5)
Glucose: 75 mg/dL (ref 65–99)
Potassium: 4 mmol/L (ref 3.5–5.2)
Sodium: 144 mmol/L (ref 134–144)
Total Protein: 6.5 g/dL (ref 6.0–8.5)

## 2019-08-01 LAB — CARBAMAZEPINE LEVEL, TOTAL: Carbamazepine (Tegretol), S: 5 ug/mL (ref 4.0–12.0)

## 2019-08-01 LAB — TSH: TSH: 0.86 u[IU]/mL (ref 0.450–4.500)

## 2019-08-03 ENCOUNTER — Ambulatory Visit (INDEPENDENT_AMBULATORY_CARE_PROVIDER_SITE_OTHER): Payer: Medicare HMO | Admitting: *Deleted

## 2019-08-03 DIAGNOSIS — J4550 Severe persistent asthma, uncomplicated: Secondary | ICD-10-CM | POA: Diagnosis not present

## 2019-08-04 ENCOUNTER — Ambulatory Visit: Payer: Medicare HMO

## 2019-08-06 ENCOUNTER — Other Ambulatory Visit: Payer: Self-pay

## 2019-08-06 ENCOUNTER — Ambulatory Visit (INDEPENDENT_AMBULATORY_CARE_PROVIDER_SITE_OTHER): Payer: Medicare HMO | Admitting: Allergy & Immunology

## 2019-08-06 ENCOUNTER — Encounter: Payer: Self-pay | Admitting: Allergy & Immunology

## 2019-08-06 VITALS — BP 118/72 | HR 84 | Temp 98.3°F | Resp 18

## 2019-08-06 DIAGNOSIS — J3089 Other allergic rhinitis: Secondary | ICD-10-CM

## 2019-08-06 DIAGNOSIS — L501 Idiopathic urticaria: Secondary | ICD-10-CM | POA: Diagnosis not present

## 2019-08-06 DIAGNOSIS — J4540 Moderate persistent asthma, uncomplicated: Secondary | ICD-10-CM
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
## 2019-08-06 NOTE — Patient Instructions (Signed)
1. Asthma-COPD overlap syndrome - Lung testing deferred today. - Daily controller medication(s): Symbicort 160/4.37mcg two puffs twice daily with spacer + Dupixent 300 mg every two weeks - Prior to physical activity: ProAir 2 puffs 10-15 minutes before physical activity. - Rescue medications: ProAir 4 puffs every 4-6 hours as needed or albuterol nebulizer one vial puffs every 4-6 hours as needed - Asthma control goals:  * Full participation in all desired activities (may need albuterol before activity) * Albuterol use two time or less a week on average (not counting use with activity) * Cough interfering with sleep two time or less a month * Oral steroids no more than once a year * No hospitalizations   2. Chronic urticaria - Continue with Xyzal twice daily. - Continue with famotidine twice daily. - It seems that the Dupixent is helping with your hives.  3. Allergic rhinitis with ear fullness - Continue with Flonase 1 spray twice daily. - Start the prednisone pack provided. - Try to deal with the stress as well.  4. Return in about 6 months (around 02/06/2020). This can be an in-person, a virtual Webex or a telephone follow up visit.   Please inform us of any Emergency Department visits, hospitalizations, or changes in symptoms. Call us before going to the ED for breathing or allergy symptoms since we might be able to fit you in for a sick visit. Feel free to contact us anytime with any questions, problems, or concerns.  It was a pleasure to see you again today!  Websites that have reliable patient information: 1. American Academy of Asthma, Allergy, and Immunology: www.aaaai.org 2. Food Allergy Research and Education (FARE): foodallergy.org 3. Mothers of Asthmatics: http://www.asthmacommunitynetwork.org 4. American College of Allergy, Asthma, and Immunology: www.acaai.org  "Like" [place] on Facebook and Instagram for our latest updates!      Make sure you are registered to vote! If  you have moved or changed any of your contact information, you will need to get this updated before voting!  In some cases, you MAY be able to register to vote online: [email or web address]    Voter ID laws are NOT going into effect for the General Election in November 2020! DO NOT let this stop you from exercising your right to vote!   Absentee voting is the SAFEST way to vote during the coronavirus pandemic!   Download and print an absentee ballot request form at [email or web address] or you can scan the QR code below with your smart phone:      More information on absentee ballots can be found here: [email or web address]

## 2019-08-10 ENCOUNTER — Telehealth: Payer: Self-pay | Admitting: *Deleted

## 2019-08-10 NOTE — Telephone Encounter (Signed)
Please call back patient.  Concerned that the headaches are not coming from her allergies and maybe triggered by something else especially with radiating down to her neck and shoulders. It sounds more concerning for possible nerve impingement/irritation, migraine or even tension headaches.  If it was from allergies, the prednisone should have made a significant improvement with the 1 course and sinus headaches do not typically radiate down to neck and shoulders.   I recommend that she follows up with her PCP regarding these headaches for further evaluation.

## 2019-08-10 NOTE — Telephone Encounter (Signed)
Dr. [name] please advise on Refill on prednisone. Looks like Dr. Ernst Bowler gave patient prednisone pack for sinus or allergies.

## 2019-08-10 NOTE — Telephone Encounter (Signed)
LMOM for patient to call back.

## 2019-08-10 NOTE — Telephone Encounter (Signed)
Patient states that she was seen on Thursday of last week by Dr.Gallagher and is still having the same issues. She is having a bad headache and it radiates down her neck and to her shoulders. She is wondering if you can call in more prednisone for her. She said she is a little better but not back to normal. Pharmacy is [facility] on [address]

## 2019-08-11 NOTE — Telephone Encounter (Signed)
Patient informed of recommendation.

## 2019-08-12 ENCOUNTER — Encounter: Payer: Self-pay | Admitting: Internal Medicine

## 2019-08-12 ENCOUNTER — Ambulatory Visit (INDEPENDENT_AMBULATORY_CARE_PROVIDER_SITE_OTHER): Payer: Medicare HMO | Admitting: Internal Medicine

## 2019-08-12 ENCOUNTER — Other Ambulatory Visit: Payer: Self-pay | Admitting: Internal Medicine

## 2019-08-12 ENCOUNTER — Other Ambulatory Visit (INDEPENDENT_AMBULATORY_CARE_PROVIDER_SITE_OTHER): Payer: Medicare HMO

## 2019-08-12 ENCOUNTER — Other Ambulatory Visit: Payer: Self-pay

## 2019-08-12 VITALS — BP 126/82 | HR 75 | Temp 98.2°F | Ht 63.0 in | Wt 173.0 lb

## 2019-08-12 DIAGNOSIS — R252 Cramp and spasm: Secondary | ICD-10-CM | POA: Diagnosis not present

## 2019-08-12 DIAGNOSIS — Z0001 Encounter for general adult medical examination with abnormal findings: Secondary | ICD-10-CM

## 2019-08-12 DIAGNOSIS — E538 Deficiency of other specified B group vitamins: Secondary | ICD-10-CM | POA: Diagnosis not present

## 2019-08-12 DIAGNOSIS — E611 Iron deficiency: Secondary | ICD-10-CM

## 2019-08-12 DIAGNOSIS — Z0000 Encounter for general adult medical examination without abnormal findings: Secondary | ICD-10-CM

## 2019-08-12 DIAGNOSIS — Z23 Encounter for immunization: Secondary | ICD-10-CM | POA: Diagnosis not present

## 2019-08-12 DIAGNOSIS — E785 Hyperlipidemia, unspecified: Secondary | ICD-10-CM

## 2019-08-12 DIAGNOSIS — E559 Vitamin D deficiency, unspecified: Secondary | ICD-10-CM | POA: Diagnosis not present

## 2019-08-12 DIAGNOSIS — R7303 Prediabetes: Secondary | ICD-10-CM | POA: Diagnosis not present

## 2019-08-12 LAB — VITAMIN D 25 HYDROXY (VIT D DEFICIENCY, FRACTURES): VITD: 29.78 ng/mL — ABNORMAL LOW (ref 30.00–100.00)

## 2019-08-12 LAB — URINALYSIS, ROUTINE W REFLEX MICROSCOPIC
Bilirubin Urine: NEGATIVE
Hgb urine dipstick: NEGATIVE
Ketones, ur: NEGATIVE
Leukocytes,Ua: NEGATIVE
Nitrite: NEGATIVE
Specific Gravity, Urine: 1.02 (ref 1.000–1.030)
Total Protein, Urine: NEGATIVE
Urine Glucose: NEGATIVE
Urobilinogen, UA: 0.2 (ref 0.0–1.0)
pH: 6 (ref 5.0–8.0)

## 2019-08-12 LAB — IBC PANEL
Iron: 92 ug/dL (ref 42–145)
Saturation Ratios: 28.8 % (ref 20.0–50.0)
Transferrin: 228 mg/dL (ref 212.0–360.0)

## 2019-08-12 LAB — HEMOGLOBIN A1C: Hgb A1c MFr Bld: 6.4 % (ref 4.6–6.5)

## 2019-08-12 LAB — VITAMIN B12: Vitamin B-12: 266 pg/mL (ref 211–911)

## 2019-08-12 LAB — MAGNESIUM: Magnesium: 2 mg/dL (ref 1.5–2.5)

## 2019-08-12 MED ORDER — CYCLOBENZAPRINE HCL 5 MG PO TABS: 5.0000 mg | ORAL_TABLET | Freq: Three times a day (TID) | ORAL | 2 refills | Status: DC | PRN

## 2019-08-12 MED ORDER — VITAMIN D (ERGOCALCIFEROL) 1.25 MG (50000 UNIT) PO CAPS: 50000.0000 [IU] | ORAL_CAPSULE | ORAL | 0 refills | Status: DC

## 2019-08-12 NOTE — Progress Notes (Signed)
Subjective:    Patient ID: [name], female    DOB: 05/13/1961, 58 y.o.   MRN: [id number]  HPI  Here for wellness and f/u;  Overall doing ok;  Pt denies Chest pain, worsening SOB, DOE, wheezing, orthopnea, PND, worsening LE edema, palpitations, dizziness or syncope.  Pt denies neurological change such as new headache, facial or extremity weakness.  Pt denies polydipsia, polyuria, or low sugar symptoms. Pt states overall good compliance with treatment and medications, good tolerability, and has been trying to follow appropriate diet.  Pt denies worsening depressive symptoms, suicidal ideation or panic. No fever, night sweats, wt loss, loss of appetite, or other constitutional symptoms.  Pt states good ability with ADL's, has low fall risk, home safety reviewed and adequate, no other significant changes in hearing or vision, and only occasionally active with exercise. Had right neck/ear/arm pain improved after prednisone.  Sometimes with muscle cramps at night.  No other new complaints Past Medical History:  Diagnosis Date  . ALLERGIC RHINITIS 05/12/2009   Qualifier: Diagnosis of  By: Lenn Cal Deborra Medina), Wynona Canes    . Anemia, iron deficiency 12/22/2014  . Angina   . Anxiety   . Arthritis   . Asthma   . ASTHMA 05/12/2009   Severe AFL (Spirometry 05/2009: pre-BD FEV1 0.87L 34% pred, post-BD FEV1 1.11L 44% pred) Volumes hyperinflated Decreased DLCO that does not fully correct to normal range for alveolar volume.     Marland Kitchen COPD 08/24/2009   Qualifier: Diagnosis of  By: Burnett Kanaris    . COPD (chronic obstructive pulmonary disease) ([place])   . Depression   . Fibromyalgia 05/14/2014  . GERD (gastroesophageal reflux disease)   . Hyperlipidemia 04/20/2017  . HYPERTENSION 05/12/2009   Qualifier: Diagnosis of  By: Lenn Cal Deborra Medina), Wynona Canes    . Migraine   . Nephrolithiasis   . Peripheral vascular disease ([place])   . Prediabetes 02/23/2014  . Seizure ([place])   . Seizure ([place])   . Urticaria    Past  Surgical History:  Procedure Laterality Date  . ABDOMINAL HYSTERECTOMY    . BACK SURGERY    . COLONOSCOPY  12/20/2011   Procedure: COLONOSCOPY;  Surgeon: Landry Dyke, MD;  Location: [facility];  Service: Endoscopy;  Laterality: N/A;  . COLONOSCOPY  03/05/2012   Procedure: COLONOSCOPY;  Surgeon: Landry Dyke, MD;  Location: [facility];  Service: Endoscopy;  Laterality: N/A;  . DIAGNOSTIC LAPAROSCOPY    . HEMORRHOID SURGERY    . INCISE AND DRAIN ABCESS    . KIDNEY STONE SURGERY    . NECK SURGERY    . SPINE SURGERY  2019  . TOE SURGERY    . TUBAL LIGATION      reports that she quit smoking about 6 months ago. Her smoking use included cigarettes. She has a 9.00 pack-year smoking history. She has never used smokeless tobacco. She reports previous alcohol use. She reports current drug use. Drug: Marijuana. family history includes Alcohol abuse in her brother and father; Anesthesia problems in her father; Asthma in her brother, mother, and sister; Brain cancer in her sister; COPD in her mother; Cancer in her father; Diabetes in her brother, father, mother, sister, and sister; Heart disease in her brother, mother, and sister; Hypertension in her sister; Kidney disease in her father; Sleep apnea in her brother. Allergies  Allergen Reactions  . Codeine Nausea And Vomiting  . Sulfa Antibiotics Nausea And Vomiting  . Sulfonamide Derivatives Nausea And Vomiting    REACTION: n/v  Current Outpatient Medications on File Prior to Visit  Medication Sig Dispense Refill  . albuterol (PROVENTIL HFA;VENTOLIN HFA) 108 (90 Base) MCG/ACT inhaler INHALE 2 PUFFS INTO THE LUNGS EVERY 4 HOURS AS NEEDED FOR WHEEZING OR SHORTNESS OF BREATH 6.7 g 0  . albuterol (PROVENTIL) (2.5 MG/3ML) 0.083% nebulizer solution Take 3 mLs (2.5 mg total) by nebulization every 4 (four) hours as needed for wheezing or shortness of breath. Reported on 06/04/2016 75 mL 2  . albuterol (VENTOLIN HFA) 108 (90 Base) MCG/ACT inhaler  INHALE 1 TO 2 PUFFS BY MOUTH EVERY 4 TO 6 HOURS AS NEEDED 18 g 0  . budesonide-formoterol (SYMBICORT) 160-4.5 MCG/ACT inhaler INHALE 2 PUFFS INTO THE LUNGS TWICE DAILY 10.2 g 5  . carbamazepine (TEGRETOL) 200 MG tablet 1  qam   2   qhs 90 tablet 5  . clindamycin (CLINDAGEL) 1 % gel Apply topically daily as needed. 30 g 2  . DUPIXENT 300 MG/2ML SOSY INJECT 600MG  (2 SYRINGES)  SUBCUTANEOUSLY ON DAY 1,  300MG  (1 SYRINGE) DAY 15,  THEN EVERY OTHER WEEK  THEREAFTER 4 mL 11  . estradiol (ESTRACE) 0.5 MG tablet Take 1 tablet (0.5 mg total) by mouth daily. 90 tablet 4  . famotidine (PEPCID) 20 MG tablet Take 1 tablet (20 mg total) by mouth 2 (two) times daily. 64 tablet 5  . fluticasone (FLONASE) 50 MCG/ACT nasal spray SHAKE LIQUID AND USE 2 SPRAYS IN EACH NOSTRIL DAILY 16 g 3  . ipratropium-albuterol (DUONEB) 0.5-2.5 (3) MG/3ML SOLN Take 3 mLs by nebulization every 4 (four) hours as needed. 360 mL 0  . irbesartan-hydrochlorothiazide (AVALIDE) 150-12.5 MG tablet Take 1 tablet by mouth daily. 90 tablet 1  . lactulose, encephalopathy, (GENERLAC) 10 GM/15ML SOLN TAKE 45 TO 90 ML( 30-60 GM) BY MOUTH TWICE DAILY AS NEEDED 473 mL 0  . levocetirizine (XYZAL) 5 MG tablet Take 1 tablet (5 mg total) by mouth 2 (two) times daily as needed for allergies (itching). 30 tablet 3  . meclizine (ANTIVERT) 12.5 MG tablet TAKE 1 TABLET(12.5 MG) BY MOUTH THREE TIMES DAILY AS NEEDED FOR DIZZINESS 30 tablet 1  . montelukast (SINGULAIR) 10 MG tablet Take 1 tablet (10 mg total) by mouth at bedtime. 30 tablet 5  . pantoprazole (PROTONIX) 40 MG tablet TAKE 1 TABLET BY MOUTH 30 TO 60 MINUTES BEFORE FIRST MEAL OF THE DAY AS DIRECTED 30 tablet 5  . pravastatin (PRAVACHOL) 40 MG tablet TAKE 1 TABLET(40 MG) BY MOUTH DAILY 90 tablet 1  . tiZANidine (ZANAFLEX) 4 MG tablet TAKE 1 TABLET(4 MG) BY MOUTH EVERY 6 HOURS AS NEEDED FOR MUSCLE SPASMS (Patient taking differently: Take 4 mg by mouth every 6 (six) hours as needed for muscle spasms. ) 60  tablet 0  . triamcinolone cream (KENALOG) 0.1 % APPLY EXTERNALLY TO THE AFFECTED AREA TWICE DAILY (Patient taking differently: Apply 1 application topically at bedtime. ) 30 g 0  . vitamin C (ASCORBIC ACID) 500 MG tablet Take 500 mg by mouth daily.     Current Facility-Administered Medications on File Prior to Visit  Medication Dose Route Frequency Provider Last Rate Last Dose  . dupilumab (DUPIXENT) prefilled syringe 300 mg  300 mg Subcutaneous Q14 Days Kozlow, Donnamarie Poag, MD   300 mg at 07/20/19 1034   Review of Systems Constitutional: Negative for other unusual diaphoresis, sweats, appetite or weight changes HENT: Negative for other worsening hearing loss, ear pain, facial swelling, mouth sores or neck stiffness.   Eyes: Negative for other worsening  pain, redness or other visual disturbance.  Respiratory: Negative for other stridor or swelling Cardiovascular: Negative for other palpitations or other chest pain  Gastrointestinal: Negative for worsening diarrhea or loose stools, blood in stool, distention or other pain Genitourinary: Negative for hematuria, flank pain or other change in urine volume.  Musculoskeletal: Negative for myalgias or other joint swelling.  Skin: Negative for other color change, or other wound or worsening drainage.  Neurological: Negative for other syncope or numbness. Hematological: Negative for other adenopathy or swelling Psychiatric/Behavioral: Negative for hallucinations, other worsening agitation, SI, self-injury, or new decreased concentration All other system neg per pt    Objective:   Physical Exam BP 126/82   Pulse 75   Temp 98.2 F (36.8 C) (Oral)   Ht 5\' 3"  (1.6 m)   Wt 173 lb (78.5 kg)   SpO2 96%   BMI 30.65 kg/m  VS noted,  Constitutional: Pt is oriented to person, place, and time. Appears well-developed and well-nourished, in no significant distress and comfortable Head: Normocephalic and atraumatic  Eyes: Conjunctivae and EOM are normal.  Pupils are equal, round, and reactive to light Right Ear: External ear normal without discharge Left Ear: External ear normal without discharge Nose: Nose without discharge or deformity Mouth/Throat: Oropharynx is without other ulcerations and moist  Neck: Normal range of motion. Neck supple. No JVD present. No tracheal deviation present or significant neck LA or mass Cardiovascular: Normal rate, regular rhythm, normal heart sounds and intact distal pulses.   Pulmonary/Chest: WOB normal and breath sounds without rales or wheezing  Abdominal: Soft. Bowel sounds are normal. NT. No HSM  Musculoskeletal: Normal range of motion. Exhibits no edema Lymphadenopathy: Has no other cervical adenopathy.  Neurological: Pt is alert and oriented to person, place, and time. Pt has normal reflexes. No cranial nerve deficit. Motor grossly intact, Gait intact Skin: Skin is warm and dry. No rash noted or new ulcerations Psychiatric:  Has normal mood and affect. Behavior is normal without agitation No other exam findings Lab Results  Component Value Date   WBC 10.0 07/31/2019   HGB 12.8 07/31/2019   HCT 39.6 07/31/2019   PLT 356 07/31/2019   GLUCOSE 75 07/31/2019   CHOL 139 06/23/2018   TRIG 52.0 06/23/2018   HDL 63.00 06/23/2018   LDLCALC 65 06/23/2018   ALT 15 07/31/2019   AST 19 07/31/2019   NA 144 07/31/2019   K 4.0 07/31/2019   CL 102 07/31/2019   CREATININE 0.64 07/31/2019   BUN 11 07/31/2019   CO2 25 07/31/2019   TSH 0.860 07/31/2019   HGBA1C 6.4 08/12/2019       Assessment & Plan:

## 2019-08-12 NOTE — Patient Instructions (Addendum)
You had the flu shot today  Please take all new medication as prescribed - the muscle relaxer as needed  Please continue all other medications as before, and refills have been done if requested.  Please have the pharmacy call with any other refills you may need.  Please continue your efforts at being more active, low cholesterol diet, and weight control.  You are otherwise up to date with prevention measures today.  Please keep your appointments with your specialists as you may have planned  Please go to the LAB in the Basement (turn left off the elevator) for the tests to be done today  You will be contacted by phone if any changes need to be made immediately.  Otherwise, you will receive a letter about your results with an explanation, but please check with MyChart first.  Please remember to sign up for MyChart if you have not done so, as this will be important to you in the future with finding out test results, communicating by private email, and scheduling acute appointments online when needed.  Please return in 6 months, or sooner if needed

## 2019-08-13 ENCOUNTER — Telehealth: Payer: Self-pay

## 2019-08-13 NOTE — Telephone Encounter (Signed)
Called pt, LVM.   CRM created.  

## 2019-08-13 NOTE — Telephone Encounter (Signed)
-----   Message from Biagio Borg, MD sent at 08/12/2019  8:52 PM EDT ----- Left message on MyChart, pt to cont same tx except  The test results show that your current treatment is OK, as the tests are stable, except the Vitamin D level is low   Please take Vitamin D 50000 units weekly for 12 weeks, then plan to change to OTC Vitamin D3 at 2000 units per day, indefinitely.  Yahaira Bruski to please inform pt, I will do rx

## 2019-08-15 ENCOUNTER — Encounter: Payer: Self-pay | Admitting: Internal Medicine

## 2019-08-15 NOTE — Assessment & Plan Note (Signed)
For fleseril prn, also check b12, and mag

## 2019-08-15 NOTE — Assessment & Plan Note (Signed)

## 2019-08-15 NOTE — Assessment & Plan Note (Signed)
stable overall by history and exam, recent data reviewed with pt, and pt to continue medical treatment as before,  to f/u any worsening symptoms or concerns  

## 2019-08-19 ENCOUNTER — Ambulatory Visit (INDEPENDENT_AMBULATORY_CARE_PROVIDER_SITE_OTHER): Payer: Medicare HMO | Admitting: *Deleted

## 2019-08-19 ENCOUNTER — Other Ambulatory Visit: Payer: Self-pay

## 2019-08-19 DIAGNOSIS — J4540 Moderate persistent asthma, uncomplicated: Secondary | ICD-10-CM

## 2019-08-25 ENCOUNTER — Ambulatory Visit: Payer: Self-pay | Admitting: *Deleted

## 2019-08-25 ENCOUNTER — Encounter: Payer: Self-pay | Admitting: Allergy & Immunology

## 2019-08-25 ENCOUNTER — Ambulatory Visit (INDEPENDENT_AMBULATORY_CARE_PROVIDER_SITE_OTHER): Payer: Medicare HMO | Admitting: Allergy & Immunology

## 2019-08-25 ENCOUNTER — Other Ambulatory Visit: Payer: Self-pay

## 2019-08-25 VITALS — BP 118/82 | HR 76 | Temp 97.7°F | Resp 16 | Ht 61.42 in | Wt 171.0 lb

## 2019-08-25 DIAGNOSIS — J4541 Moderate persistent asthma with (acute) exacerbation: Secondary | ICD-10-CM | POA: Diagnosis not present

## 2019-08-25 DIAGNOSIS — L501 Idiopathic urticaria: Secondary | ICD-10-CM

## 2019-08-25 DIAGNOSIS — J3089 Other allergic rhinitis: Secondary | ICD-10-CM

## 2019-08-25 MED ORDER — METHYLPREDNISOLONE ACETATE 80 MG/ML IJ SUSP
80.0000 mg | Freq: Once | INTRAMUSCULAR | Status: AC
  Administered 2019-08-25: 80 mg via INTRAMUSCULAR

## 2019-08-25 NOTE — Progress Notes (Signed)
FOLLOW UP  Date of Service/Encounter:  08/25/19   Assessment:   Perennial allergic rhinitis  Asthma-COPD overlap syndrome with acute exacerbation - 4th prednisone burst in 2020  Chronic urticaria - on Dupixent 300mg  every two weeks   It is really difficult to gauge how well Rhapsody is doing from a breathing perspective.  She does report that she has been much more active while on the [place] and she is now exercising quite a bit more.  She reports losing some weight as well.  However, this is in contrast with the fact that she has needed for courses of prednisone now in a calendar year 2020.  She is not excited about going back to Xolair since she did not feel quite as good on that, although per review of her records from 2019 she did not need prednisone as often as she is needing it now.  Given these conflicting facts, we are going to continue with the current meds for now and if she needs another prednisone burst in the near future, we will discuss more strongly changing her biologic.   Plan/Recommendations:   1. Asthma-COPD overlap syndrome - Lung testing looked awful today, but it did improve with the albuterol treatment. - DepoMedrol given in clinic today. - Start the prednisone dose pack.  - If you need another prednisone burst in the near future, we are going to have to consider changing biologics.  - Daily controller medication(s): Symbicort 160/4.45mcg two puffs twice daily with spacer + Dupixent 300 mg every two weeks - Prior to physical activity: ProAir 2 puffs 10-15 minutes before physical activity. - Rescue medications: ProAir 4 puffs every 4-6 hours as needed or albuterol nebulizer one vial puffs every 4-6 hours as needed - Asthma control goals:  * Full participation in all desired activities (may need albuterol before activity) * Albuterol use two time or less a week on average (not counting use with activity) * Cough interfering with sleep two time or less a  month * Oral steroids no more than once a year * No hospitalizations   2. Chronic urticaria - Continue with Xyzal twice daily. - Continue with famotidine twice daily. - It seems that the Dupixent is helping with your hives.  3. Allergic rhinitis with ear fullness - Continue with Flonase 1 spray twice daily. - Start the prednisone pack provided. - Try to deal with the stress as well.  4. Return in about 3 months (around 11/24/2019). This can be an in-person, a virtual Webex or a telephone follow up visit.   Subjective:   [name] is a 58 y.o. 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   Syncope 09/04/2017   Constipation 09/04/2017   Nonintractable headache 09/04/2017   Leg swelling 07/26/2017   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   Hyperlipidemia 04/20/2017   Pedal edema 04/19/2017   Bipolar I disorder ([place]) 09/19/2016   Facial pain 07/12/2016   Rash 06/14/2016  Migraine 01/25/2016   Idiopathic urticaria/pruritus 01/23/2016   Perennial allergic rhinitis 01/23/2016   Oral candidiasis 01/23/2016   Greater trochanteric bursitis of both hips 09/08/2015   Chest pain 06/22/2015   Itching 06/22/2015   Bilateral knee pain 06/22/2015   Bilateral hip pain 06/22/2015   Anemia, iron deficiency 12/22/2014   Fatigue 12/21/2014   Intertrigo 08/07/2014   Dyspareunia 07/12/2014   Menopausal syndrome (hot flushes) 07/12/2014    History of tobacco abuse 07/12/2014   Dizziness 06/22/2014   Weakness 06/22/2014   Fibromyalgia 05/14/2014   Preventative health care 04/22/2014   Recurrent boils 04/22/2014   Recurrent falls 04/22/2014   Peripheral vascular disease ([place])    Depression    Anxiety    GERD (gastroesophageal reflux disease)    Prediabetes 02/23/2014   Screening mammogram for high-risk patient 02/23/2014   Back pain 07/22/2013   COPD/asthma 08/24/2009   Headache(784.0) 08/24/2009   HTN (hypertension) 05/12/2009    History obtained from: chart review and patient.  Genessy Mindel is a 58 y.o. female presenting for a sick visit.  She was last seen in August 2020.  At that time, we deferred her lung testing.  We continued with Symbicort 160/4.5 mcg 2 puffs twice daily with a spacer in combination with Dupixent every 2 weeks.  For her chronic urticaria, we continue with Xyzal twice daily as well as famotidine twice daily.  We felt that the Dupixent was helping with her hives.  For her allergic rhinitis, we continued Flonase 1 spray per nostril twice daily and started on a prednisone pack.  In the calendar year 2020, she has received prednisone in January, July, August, and today.    Since last visit, she has mostly done well. She has been active until the past Thursday. She reports that she started coughing up lots of phlegnm. She reports that she was having some chest tightness. She has not had a fever. She has not been around anyone who has been sick. She mostly stays home.   Asthma/Respiratory Symptom History: She remains on her Symbicort two puffs twice daily. She has been getting her Dupixent. She also reports that she was around some kind of air scent from her brother and it seemed to be "too strong" for her.  She has been a lot more active in the [place] and reports that she has never been able to exercise this much.  She was on Xolair prior to this due to her history of coexisting urticaria and  asthma.  Before her current symptoms started, she was sleeping well at night.  Allergic Rhinitis Symptom History: She remains on Flonase and an antihistamine as needed.  She has not needed any antibiotics for sinusitis.  She denies any postnasal drip.  Overall, she reports that her allergic rhinitis is under good control.  Eczema Symptom History: She has had no problems with urticaria while on the [place].  She has not been needing any antihistamines both beyond what she is on now.  Otherwise, there have been no changes to her past medical history, surgical history, family history, or social history.    Review of Systems  Constitutional: Negative.  Negative for chills, fever, malaise/fatigue and weight loss.  HENT: Negative.  Negative for congestion, ear discharge, ear pain, nosebleeds, sinus pain and sore throat.   Eyes: Negative for pain, discharge and redness.  Respiratory: Positive for cough, shortness of breath and wheezing. Negative for sputum production.   Cardiovascular: Negative.  Negative for chest pain and palpitations.  Gastrointestinal: Negative for abdominal pain, heartburn, nausea and vomiting.  Skin: Negative.  Negative for itching and rash.  Neurological: Negative for dizziness and headaches.  Endo/Heme/Allergies: Negative for environmental allergies. Does not bruise/bleed easily.       Objective:   Blood pressure 118/82, pulse 76, temperature 97.7 F (36.5 C), temperature source Temporal, resp. rate 16, height 5' 1.42" (1.56 m), weight 171 lb (77.6 kg), SpO2 96 %. Body mass index is 31.87 kg/m.   Physical Exam:  Physical Exam  Constitutional: She appears well-developed.  She looks very good today and is not in respiratory distress despite her spirometric findings.  HENT:  Head: Normocephalic and atraumatic.  Right Ear: Tympanic membrane, external ear and ear canal normal.  Left Ear: Tympanic membrane, external ear and ear canal normal.  Nose: Mucosal edema  and rhinorrhea present. No nasal deformity or septal deviation. No epistaxis. Right sinus exhibits no maxillary sinus tenderness and no frontal sinus tenderness. Left sinus exhibits no maxillary sinus tenderness and no frontal sinus tenderness.  Mouth/Throat: Uvula is midline and oropharynx is clear and moist. Mucous membranes are not pale and not dry.  Eyes: Pupils are equal, round, and reactive to light. Conjunctivae and EOM are normal. Right eye exhibits no chemosis and no discharge. Left eye exhibits no chemosis and no discharge. Right conjunctiva is not injected. Left conjunctiva is not injected.  Cardiovascular: Normal rate, regular rhythm and normal heart sounds.  Respiratory: Effort normal. No accessory muscle usage. No tachypnea. No respiratory distress. She has wheezes in the right upper field, the right middle field, the left upper field and the left middle field. She has no rhonchi. She has no rales. She exhibits no tenderness.  She does have scattered expiratory wheezes in the upper lung fields.  Lymphadenopathy:    She has cervical adenopathy.       Right cervical: Superficial cervical adenopathy present.       Left cervical: Superficial cervical adenopathy present.  Neurological: She is alert.  Skin: No abrasion, no petechiae and no rash noted. Rash is not papular, not vesicular and not urticarial. No erythema. No pallor.  No eczematous lesions noted.  Psychiatric: She has a normal mood and affect.     Diagnostic studies:    Spirometry: results abnormal (FEV1: 0.62/32%, FVC: 1.43/59%, FEV1/FVC: 43%).    Spirometry consistent with mixed obstructive and restrictive disease. 4 puffs of albuterol treatment given in clinic with significant improvement in FEV1 per ATS criteria.  Allergy Studies: none      Salvatore Marvel, MD  [facility]

## 2019-08-25 NOTE — Patient Instructions (Addendum)
1. Asthma-COPD overlap syndrome - Lung testing looked awful today, but it did improve with the albuterol treatment. - DepoMedrol given in clinic today. - Start the prednisone dose pack.  - If you need another prednisone burst in the near future, we are going to have to consider changing biologics.  - Daily controller medication(s): Symbicort 160/4.56mcg two puffs twice daily with spacer + Dupixent 300 mg every two weeks - Prior to physical activity: ProAir 2 puffs 10-15 minutes before physical activity. - Rescue medications: ProAir 4 puffs every 4-6 hours as needed or albuterol nebulizer one vial puffs every 4-6 hours as needed - Asthma control goals:  * Full participation in all desired activities (may need albuterol before activity) * Albuterol use two time or less a week on average (not counting use with activity) * Cough interfering with sleep two time or less a month * Oral steroids no more than once a year * No hospitalizations   2. Chronic urticaria - Continue with Xyzal twice daily. - Continue with famotidine twice daily. - It seems that the Dupixent is helping with your hives.  3. Allergic rhinitis with ear fullness - Continue with Flonase 1 spray twice daily. - Start the prednisone pack provided. - Try to deal with the stress as well.  4. Return in about 3 months (around 11/24/2019). This can be an in-person, a virtual Webex or a telephone follow up visit.   Please inform us of any Emergency Department visits, hospitalizations, or changes in symptoms. Call us before going to the ED for breathing or allergy symptoms since we might be able to fit you in for a sick visit. Feel free to contact us anytime with any questions, problems, or concerns.  It was a pleasure to see you again today!  Websites that have reliable patient information: 1. American Academy of Asthma, Allergy, and Immunology: www.aaaai.org 2. Food Allergy Research and Education (FARE): foodallergy.org 3.  Mothers of Asthmatics: http://www.asthmacommunitynetwork.org 4. American College of Allergy, Asthma, and Immunology: www.acaai.org  "Like" [place] on Facebook and Instagram for our latest updates!      Make sure you are registered to vote! If you have moved or changed any of your contact information, you will need to get this updated before voting!  In some cases, you MAY be able to register to vote online: [email or web address]    Voter ID laws are NOT going into effect for the General Election in November 2020! DO NOT let this stop you from exercising your right to vote!   Absentee voting is the SAFEST way to vote during the coronavirus pandemic!   Download and print an absentee ballot request form at [email or web address] or you can scan the QR code below with your smart phone:      More information on absentee ballots can be found here: [email or web address]  [place] VOTING SITES have been established! You can register to vote and cast your vote on the same day at these locations. See this site for more information on what you need to register to vote: [email or web address]

## 2019-08-25 NOTE — Telephone Encounter (Signed)
Spoke with pt. Advised she should go to the ED for eval. Pt acknowledged recommendation.

## 2019-08-25 NOTE — Telephone Encounter (Signed)
Patient states she has been using OTC treatment for congestion. It is upsetting her asthma and she is having to use her nebulizer 3 times/day, her rescue inhaler and well as normal inhaler. Attempted to call office-recording that they are not open. Message sent high priority- patient instructed to use nebulizer and go to ED if she feels she is not improving. Reason for Disposition . [1] MILD asthma attack (e.g., no SOB at rest, mild SOB with walking, speaks normally in sentences, mild wheezing) AND [2]  persists > 24 hours on appropriate treatment  Answer Assessment - Initial Assessment Questions 1. RESPIRATORY STATUS: "Describe your breathing?" (e.g., wheezing, shortness of breath, unable to speak, severe coughing)      wheezing 2. ONSET: "When did this breathing problem begin?"      yesconstantterday 3. PATTERN "Does the difficult breathing come and go, or has it been constant since it started?"      Breathing treatment- a little 4. SEVERITY: "How bad is your breathing?" (e.g., mild, moderate, severe)    - MILD: No SOB at rest, mild SOB with walking, speaks normally in sentences, can lay down, no retractions, pulse < 100.    - MODERATE: SOB at rest, SOB with minimal exertion and prefers to sit, cannot lie down flat, speaks in phrases, mild retractions, audible wheezing, pulse 100-120.    - SEVERE: Very SOB at rest, speaks in single words, struggling to breathe, sitting hunched forward, retractions, pulse > 120      Moderate to sevre 5. RECURRENT SYMPTOM: "Have you had difficulty breathing before?" If so, ask: "When was the last time?" and "What happened that time?"      Asthma, COPD 6. CARDIAC HISTORY: "Do you have any history of heart disease?" (e.g., heart attack, angina, bypass surgery, angioplasty)      High blood pressure 7. LUNG HISTORY: "Do you have any history of lung disease?"  (e.g., pulmonary embolus, asthma, emphysema)     Asthma, COPD 8. CAUSE: "What do you think is causing the  breathing problem?"      Congestion and cough 9. OTHER SYMPTOMS: "Do you have any other symptoms? (e.g., dizziness, runny nose, cough, chest pain, fever)     Cough, mucus congestion 10. PREGNANCY: "Is there any chance you are pregnant?" "When was your last menstrual period?"       n/a 11. TRAVEL: "Have you traveled out of the country in the last month?" (e.g., travel history, exposures)       no  Protocols used: ASTHMA ATTACK-A-AH, BREATHING DIFFICULTY-A-AH

## 2019-09-01 ENCOUNTER — Telehealth: Payer: Self-pay

## 2019-09-01 ENCOUNTER — Other Ambulatory Visit: Payer: Self-pay | Admitting: Emergency Medicine

## 2019-09-01 ENCOUNTER — Other Ambulatory Visit: Payer: Self-pay | Admitting: Internal Medicine

## 2019-09-01 NOTE — Telephone Encounter (Signed)
Patient seen 08-25-2019. She has finished the prednisone and is using all inhalers as prescribed. Patient is still complaining of a productive cough with clear sputum. She denies any other symptoms. Please advise on further instructions and thank you.

## 2019-09-02 ENCOUNTER — Telehealth: Payer: Self-pay | Admitting: *Deleted

## 2019-09-02 ENCOUNTER — Encounter: Payer: Self-pay | Admitting: Primary Care

## 2019-09-02 ENCOUNTER — Other Ambulatory Visit: Payer: Self-pay

## 2019-09-02 ENCOUNTER — Ambulatory Visit (INDEPENDENT_AMBULATORY_CARE_PROVIDER_SITE_OTHER): Payer: Medicare HMO | Admitting: *Deleted

## 2019-09-02 ENCOUNTER — Ambulatory Visit (INDEPENDENT_AMBULATORY_CARE_PROVIDER_SITE_OTHER): Payer: Medicare HMO | Admitting: Primary Care

## 2019-09-02 DIAGNOSIS — J441 Chronic obstructive pulmonary disease with (acute) exacerbation: Secondary | ICD-10-CM

## 2019-09-02 DIAGNOSIS — J4540 Moderate persistent asthma, uncomplicated: Secondary | ICD-10-CM | POA: Diagnosis not present

## 2019-09-02 DIAGNOSIS — J4541 Moderate persistent asthma with (acute) exacerbation: Secondary | ICD-10-CM

## 2019-09-02 MED ORDER — DOXYCYCLINE HYCLATE 100 MG PO TABS: 100.0000 mg | ORAL_TABLET | Freq: Two times a day (BID) | ORAL | 0 refills | Status: DC

## 2019-09-02 NOTE — Progress Notes (Signed)
Virtual Visit via Telephone Note  I connected with [name] on 09/02/19 at 11:30 AM EDT by telephone and verified that I am speaking with the correct person using two identifiers.  Location: Patient: Home  Provider: Office   I discussed the limitations, risks, security and privacy concerns of performing an evaluation and management service by telephone and the availability of in person appointments. I also discussed with the patient that there may be a patient responsible charge related to this service. The patient expressed understanding and agreed to proceed.   History of Present Illness: 58 year old female, former smoker. PMH COPD/asthma overlap syndrome, perennial allergic rhinitis, HTN, GERD, pituitary cyst, seizure. Patient of Dr. Lamonte Sakai, last seen on 02/17/19. Maintained on Symbicort 80, Dupixent 300mg  injection q2 weeks, Singulair 10mg .   09/02/2019 Patient contacted today for televisit with complaints of cough x 3 weeks. She saw her allergist, Dr. Ernst Bowler last week for shortness of breath. She was given steroid injection and prednisone course. Reports her breathing is better but still has a lingering cough which is congested but mostly dry. She was taking mucinex but cant continue to afford. Received dupixent injection yesterday. Up to date with influenza vaccine. Denies nasal congestion or PND. Afebrile.    Observations/Objective:  - No shortness of breath or wheezing noted - Moderate dry/hacking cough   Assessment and Plan:  COPD/asthma exacerbation - Improved with steroid course - Needs Rx doxycycline 1 tab twice daily x 7 days - Continue Dupixent 300mg  IM q 2 weeks - Advised delsym and mucinex twice daily if able   Follow Up Instructions:   - Return if symptoms do not improve/worsen   I discussed the assessment and treatment plan with the patient. The patient was provided an opportunity to ask questions and all were answered. The patient agreed with the plan  and demonstrated an understanding of the instructions.   The patient was advised to call back or seek an in-person evaluation if the symptoms worsen or if the condition fails to improve as anticipated.  I provided 18 minutes of non-face-to-face time during this encounter.   Martyn Ehrich, NP

## 2019-09-02 NOTE — Telephone Encounter (Signed)
It looks like she was placed on doxycycline by Pulmonology today.  Salvatore Marvel, MD [facility]

## 2019-09-02 NOTE — Telephone Encounter (Signed)
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 Dr Leta Baptist stated it will be repeated scan in 1-2 years. Patient verbalized understanding, appreciation.

## 2019-09-02 NOTE — Patient Instructions (Addendum)
COPD/asthma exacerbation - Needs Rx doxycycline  - Advised delsym and mucinex twice daily if able  - Continue Dupixent injections per allergy  - Return if symptoms do not improve

## 2019-09-15 ENCOUNTER — Encounter: Payer: Self-pay | Admitting: Gynecology

## 2019-09-16 ENCOUNTER — Other Ambulatory Visit: Payer: Self-pay

## 2019-09-16 ENCOUNTER — Ambulatory Visit (INDEPENDENT_AMBULATORY_CARE_PROVIDER_SITE_OTHER): Payer: Medicare HMO

## 2019-09-16 ENCOUNTER — Ambulatory Visit (INDEPENDENT_AMBULATORY_CARE_PROVIDER_SITE_OTHER): Payer: Medicare HMO | Admitting: Psychiatry

## 2019-09-16 DIAGNOSIS — J4540 Moderate persistent asthma, uncomplicated: Secondary | ICD-10-CM

## 2019-09-16 DIAGNOSIS — F3160 Bipolar disorder, current episode mixed, unspecified: Secondary | ICD-10-CM

## 2019-09-16 DIAGNOSIS — J4541 Moderate persistent asthma with (acute) exacerbation: Secondary | ICD-10-CM

## 2019-09-16 MED ORDER — CARBAMAZEPINE 200 MG PO TABS: ORAL_TABLET | ORAL | 6 refills | Status: DC
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
## 2019-09-18 ENCOUNTER — Telehealth: Payer: Self-pay | Admitting: Primary Care

## 2019-09-18 MED ORDER — PREDNISONE 10 MG PO TABS: ORAL_TABLET | ORAL | 0 refills | Status: DC

## 2019-09-18 NOTE — Telephone Encounter (Signed)
Will send in prescription for prednisone taper. Make sure she is using her Flonase daily and taking Singulair 10mg  at bedtime. She can add an antihistamine such as Claritin OR zyrtec as well.

## 2019-09-18 NOTE — Telephone Encounter (Signed)
Primary Pulmonologist: Dr. Lamonte Sakai Last office visit and with whom: [name], NP 09/02/19- tele visit What do we see them for (pulmonary problems): copd Last OV assessment/plan:  Instructions  COPD/asthma exacerbation - Needs Rx doxycycline  - Advised delsym and mucinex twice daily if able  - Continue Dupixent injections per allergy  - Return if symptoms do not improve        Was appointment offered to patient (explain)?  Patient is waiting to see if she needs in office OV,  [facility], or if [name] has other recommendations.   Reason for call: Patient stated her cough has not improved since her last OV with Felicia Maize, NP, 09/02/19.  Patient stated she completed the Doxycycline prescription, used 2 bottles of Delsym, has been using mucinex, has tried Robitussin cough syrup, and had Dupixent injection 2 days ago. Patient stated her cough is first thing in the morning, with light green phlegm, and non productive at night. Patient denies any other symptoms.  Message routed to [name], NP to advise

## 2019-09-18 NOTE — Telephone Encounter (Signed)
Spoke with the pt and notified of recs per Geraldo Pitter  She verbalized understanding  Nothing further needed

## 2019-09-29 ENCOUNTER — Other Ambulatory Visit: Payer: Self-pay | Admitting: Emergency Medicine

## 2019-09-29 MED ORDER — FLUTICASONE PROPIONATE 50 MCG/ACT NA SUSP: NASAL | 3 refills | Status: DC

## 2019-09-30 ENCOUNTER — Ambulatory Visit (INDEPENDENT_AMBULATORY_CARE_PROVIDER_SITE_OTHER): Payer: Medicare HMO

## 2019-09-30 ENCOUNTER — Other Ambulatory Visit: Payer: Self-pay

## 2019-09-30 DIAGNOSIS — J4540 Moderate persistent asthma, uncomplicated: Secondary | ICD-10-CM | POA: Diagnosis not present

## 2019-10-14 ENCOUNTER — Ambulatory Visit (INDEPENDENT_AMBULATORY_CARE_PROVIDER_SITE_OTHER): Payer: Medicare HMO

## 2019-10-14 ENCOUNTER — Other Ambulatory Visit: Payer: Self-pay

## 2019-10-14 DIAGNOSIS — J4540 Moderate persistent asthma, uncomplicated: Secondary | ICD-10-CM

## 2019-10-23 ENCOUNTER — Telehealth: Payer: Self-pay | Admitting: Internal Medicine

## 2019-10-23 NOTE — Telephone Encounter (Signed)
Caller name: Loa Socks  Relation to pt: from [place]  Call back number: [phone]   Reason for call:  As per mail order pharmacy patient requesting calcipotriene cream 0.005 , second request faxed to  , informed please alow 48 to 72 hour turn around time

## 2019-10-26 NOTE — Telephone Encounter (Signed)
Called pt, LVM  Need to check with the patient to see if this is something that she has requested. I do not see this mail order listed in her chart.

## 2019-10-27 ENCOUNTER — Telehealth (HOSPITAL_COMMUNITY): Payer: Self-pay | Admitting: Psychiatry

## 2019-10-28 ENCOUNTER — Other Ambulatory Visit: Payer: Self-pay

## 2019-10-28 ENCOUNTER — Ambulatory Visit (INDEPENDENT_AMBULATORY_CARE_PROVIDER_SITE_OTHER): Payer: Medicare HMO | Admitting: *Deleted

## 2019-10-28 DIAGNOSIS — J4540 Moderate persistent asthma, uncomplicated: Secondary | ICD-10-CM | POA: Diagnosis not present

## 2019-11-02 ENCOUNTER — Encounter: Payer: Self-pay | Admitting: Emergency Medicine

## 2019-11-02 ENCOUNTER — Ambulatory Visit
Admission: EM | Admit: 2019-11-02 | Discharge: 2019-11-02 | Disposition: A | Discharge status: Home / Self Care | Payer: Medicare HMO | Attending: Emergency Medicine | Admitting: Emergency Medicine

## 2019-11-02 ENCOUNTER — Ambulatory Visit (INDEPENDENT_AMBULATORY_CARE_PROVIDER_SITE_OTHER): Payer: Medicare HMO

## 2019-11-02 ENCOUNTER — Other Ambulatory Visit: Payer: Self-pay

## 2019-11-02 DIAGNOSIS — S161XXA Strain of muscle, fascia and tendon at neck level, initial encounter: Secondary | ICD-10-CM

## 2019-11-02 DIAGNOSIS — M542 Cervicalgia: Secondary | ICD-10-CM | POA: Diagnosis not present

## 2019-11-02 DIAGNOSIS — S39012A Strain of muscle, fascia and tendon of lower back, initial encounter: Secondary | ICD-10-CM | POA: Diagnosis not present

## 2019-11-02 NOTE — ED Notes (Signed)
Patient able to ambulate independently  
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
## 2019-11-02 NOTE — Discharge Instructions (Addendum)
Recommend RICE: rest, ice, compression, elevation as needed for pain.    Heat therapy (hot compress, warm wash red, hot showers, etc.) can help relax muscles and soothe muscle aches. Cold therapy (ice packs) can be used to help swelling both after injury and after prolonged use of areas of chronic pain/aches.  For pain: recommend 350 mg-1000 mg of Tylenol (acetaminophen) and/or 200 mg - 800 mg of Advil (ibuprofen, Motrin) every 8 hours as needed.  May alternate between the two throughout the day as they are generally safe to take together.  DO NOT exceed more than 3000 mg of Tylenol or 3200 mg of ibuprofen in a 24 hour period as this could damage your stomach, kidneys, liver, or increase your bleeding risk.  May take muscle relaxer as needed for severe pain / spasm.  (This medication may cause you to become tired so it is important you do not drink alcohol or operate heavy machinery while on this medication.  Recommend your first dose to be taken before bedtime to monitor for side effects safely)  Go to ER for worsening pain, inability to move neck, low back, saddle area anesthesia, loss of bowel or bladder control.

## 2019-11-03 ENCOUNTER — Ambulatory Visit: Payer: Medicare HMO | Admitting: Internal Medicine

## 2019-11-04 ENCOUNTER — Encounter: Payer: Self-pay | Admitting: Internal Medicine

## 2019-11-04 ENCOUNTER — Ambulatory Visit (INDEPENDENT_AMBULATORY_CARE_PROVIDER_SITE_OTHER): Payer: Medicare HMO | Admitting: Internal Medicine

## 2019-11-04 ENCOUNTER — Other Ambulatory Visit: Payer: Self-pay

## 2019-11-04 VITALS — BP 122/84 | HR 92 | Temp 98.5°F | Ht 61.0 in | Wt 162.0 lb

## 2019-11-04 DIAGNOSIS — T7840XA Allergy, unspecified, initial encounter: Secondary | ICD-10-CM | POA: Diagnosis not present

## 2019-11-04 DIAGNOSIS — E559 Vitamin D deficiency, unspecified: Secondary | ICD-10-CM | POA: Diagnosis not present

## 2019-11-04 DIAGNOSIS — J3089 Other allergic rhinitis: Secondary | ICD-10-CM | POA: Diagnosis not present

## 2019-11-04 DIAGNOSIS — F319 Bipolar disorder, unspecified: Secondary | ICD-10-CM

## 2019-11-04 DIAGNOSIS — R739 Hyperglycemia, unspecified: Secondary | ICD-10-CM

## 2019-11-04 DIAGNOSIS — Z0000 Encounter for general adult medical examination without abnormal findings: Secondary | ICD-10-CM

## 2019-11-04 DIAGNOSIS — J441 Chronic obstructive pulmonary disease with (acute) exacerbation: Secondary | ICD-10-CM

## 2019-11-04 DIAGNOSIS — E611 Iron deficiency: Secondary | ICD-10-CM | POA: Diagnosis not present

## 2019-11-04 DIAGNOSIS — E538 Deficiency of other specified B group vitamins: Secondary | ICD-10-CM | POA: Diagnosis not present

## 2019-11-04 DIAGNOSIS — R7303 Prediabetes: Secondary | ICD-10-CM

## 2019-11-04 MED ORDER — PRAVASTATIN SODIUM 40 MG PO TABS: ORAL_TABLET | ORAL | 3 refills | Status: DC

## 2019-11-04 MED ORDER — HYDROCHLOROTHIAZIDE 12.5 MG PO CAPS: 12.5000 mg | ORAL_CAPSULE | Freq: Every day | ORAL | 3 refills | Status: DC

## 2019-11-04 MED ORDER — IRBESARTAN-HYDROCHLOROTHIAZIDE 150-12.5 MG PO TABS: 1.0000 | ORAL_TABLET | Freq: Every day | ORAL | 3 refills | Status: DC

## 2019-11-04 MED ORDER — PANTOPRAZOLE SODIUM 40 MG PO TBEC: DELAYED_RELEASE_TABLET | ORAL | 3 refills | Status: DC

## 2019-11-04 MED ORDER — METHYLPREDNISOLONE ACETATE 80 MG/ML IJ SUSP
80.0000 mg | Freq: Once | INTRAMUSCULAR | Status: AC
  Administered 2019-11-04: 16:00:00 80 mg via INTRAMUSCULAR
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
## 2019-11-04 NOTE — Patient Instructions (Addendum)
You had the steroid shot today for allergies and may help your neck as well  Please continue all other medications as before, and refills have been done if requested.  Please have the pharmacy call with any other refills you may need.  Please continue your efforts at being more active, low cholesterol diet, and weight control.  Please keep your appointments with your specialists as you may have planned  Please return in 6 months, or sooner if needed, with Lab testing done 3-5 days before

## 2019-11-06 ENCOUNTER — Other Ambulatory Visit: Payer: Self-pay | Admitting: Primary Care

## 2019-11-06 ENCOUNTER — Other Ambulatory Visit (HOSPITAL_COMMUNITY): Payer: Self-pay | Admitting: Psychiatry

## 2019-11-06 ENCOUNTER — Other Ambulatory Visit: Payer: Self-pay | Admitting: Emergency Medicine

## 2019-11-08 ENCOUNTER — Encounter: Payer: Self-pay | Admitting: Internal Medicine

## 2019-11-08 NOTE — Assessment & Plan Note (Signed)
Mild to mod, with mild seasonal flare, for depomedrol IM 80, to f/u any worsening symptoms or concerns

## 2019-11-08 NOTE — Assessment & Plan Note (Signed)
stable overall by history and exam, recent data reviewed with pt, and pt to continue medical treatment as before,  to f/u any worsening symptoms or concerns  

## 2019-11-11 ENCOUNTER — Ambulatory Visit (INDEPENDENT_AMBULATORY_CARE_PROVIDER_SITE_OTHER): Payer: Medicare HMO | Admitting: *Deleted

## 2019-11-11 ENCOUNTER — Other Ambulatory Visit: Payer: Self-pay

## 2019-11-11 DIAGNOSIS — J4540 Moderate persistent asthma, uncomplicated: Secondary | ICD-10-CM | POA: Diagnosis not present

## 2019-11-16 ENCOUNTER — Encounter: Payer: Self-pay | Admitting: Internal Medicine

## 2019-11-16 ENCOUNTER — Other Ambulatory Visit: Payer: Self-pay | Admitting: Internal Medicine

## 2019-11-16 MED ORDER — GUAIFENESIN-DM 100-10 MG/5ML PO SYRP: 5.0000 mL | ORAL_SOLUTION | ORAL | 1 refills | Status: DC | PRN

## 2019-11-16 MED ORDER — TRIAMCINOLONE ACETONIDE 0.1 % EX CREA: TOPICAL_CREAM | Freq: Two times a day (BID) | CUTANEOUS | 0 refills | Status: DC

## 2019-11-25 ENCOUNTER — Other Ambulatory Visit: Payer: Self-pay

## 2019-11-25 ENCOUNTER — Ambulatory Visit (INDEPENDENT_AMBULATORY_CARE_PROVIDER_SITE_OTHER): Payer: Medicare HMO

## 2019-11-25 DIAGNOSIS — J4540 Moderate persistent asthma, uncomplicated: Secondary | ICD-10-CM | POA: Diagnosis not present

## 2019-12-09 ENCOUNTER — Ambulatory Visit (INDEPENDENT_AMBULATORY_CARE_PROVIDER_SITE_OTHER): Payer: Medicare HMO | Admitting: *Deleted

## 2019-12-09 ENCOUNTER — Other Ambulatory Visit: Payer: Self-pay

## 2019-12-09 DIAGNOSIS — J4540 Moderate persistent asthma, uncomplicated: Secondary | ICD-10-CM

## 2019-12-22 ENCOUNTER — Other Ambulatory Visit: Payer: Self-pay

## 2019-12-22 MED ORDER — FAMOTIDINE 20 MG PO TABS: 20.0000 mg | ORAL_TABLET | Freq: Two times a day (BID) | ORAL | 2 refills | Status: DC

## 2019-12-25 ENCOUNTER — Other Ambulatory Visit: Payer: Self-pay

## 2019-12-25 ENCOUNTER — Ambulatory Visit (INDEPENDENT_AMBULATORY_CARE_PROVIDER_SITE_OTHER): Payer: Medicare HMO

## 2019-12-25 DIAGNOSIS — J4540 Moderate persistent asthma, uncomplicated: Secondary | ICD-10-CM

## 2019-12-28 ENCOUNTER — Other Ambulatory Visit: Payer: Self-pay

## 2019-12-28 ENCOUNTER — Telehealth: Payer: Self-pay

## 2019-12-28 ENCOUNTER — Encounter: Payer: Self-pay | Admitting: Diagnostic Neuroimaging

## 2019-12-28 ENCOUNTER — Ambulatory Visit (INDEPENDENT_AMBULATORY_CARE_PROVIDER_SITE_OTHER): Payer: Medicare HMO | Admitting: Diagnostic Neuroimaging

## 2019-12-28 VITALS — BP 122/74 | HR 94 | Temp 97.3°F | Ht 63.0 in | Wt 155.6 lb

## 2019-12-28 DIAGNOSIS — G40909 Epilepsy, unspecified, not intractable, without status epilepticus: Secondary | ICD-10-CM | POA: Diagnosis not present

## 2019-12-28 MED ORDER — CARBAMAZEPINE 200 MG PO TABS: 400.0000 mg | ORAL_TABLET | Freq: Two times a day (BID) | ORAL | 12 refills | Status: DC

## 2019-12-28 NOTE — Telephone Encounter (Signed)
Patient called stating she is having back head pain and shoulder neck pain. Patient states it Is similar to having a stoke where she locks up like that with pain.   Patient was scheduled next available at the time of call. Please follow up.

## 2019-12-28 NOTE — Patient Instructions (Addendum)
SEIZURE DISORDER (last seizure 12/27/19)  - increase carbamazepine to 400mg  twice a day (initially by Dr. Casimiro Needle for mood stabilization; can also help with seizure control)  - According to [place] law, you can not drive unless you are seizure / syncope free for at least 6 months and under physician's care.   - Please maintain precautions. Do not participate in activities where a loss of awareness could harm you or someone else. No swimming alone, no tub bathing, no hot tubs, no driving, no operating motorized vehicles (cars, ATVs, motocycles, etc), lawnmowers, power tools or firearms. No standing at heights, such as rooftops, ladders or stairs. Avoid hot objects such as stoves, heaters, open fires. Wear a helmet when riding a bicycle, scooter, skateboard, etc. and avoid areas of traffic. Set your water heater to 120 degrees or less.
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
## 2020-01-05 ENCOUNTER — Telehealth: Payer: Self-pay | Admitting: Emergency Medicine

## 2020-01-05 NOTE — Telephone Encounter (Signed)
Called spoke with patient who reported having contact with her neighbor that had traveled to [place] and is now having a cough - no COVID diagnosis.  Patient stated that she has her typical cough and some increased dyspnea and headaches with stiffness in her neck.  Patient is scheduled for office visit with Dr Lamonte Sakai tomorrow at 0900.  Advised patient with her new headache and joint stiffness in her neck, would recommend video visit.  Patient happy to comply.  Appt changed to video visit, pt will need link.  Will sign and forward to Ria Comment to make her aware for tomorrow.

## 2020-01-06 ENCOUNTER — Telehealth (INDEPENDENT_AMBULATORY_CARE_PROVIDER_SITE_OTHER): Payer: Medicare HMO | Admitting: Emergency Medicine

## 2020-01-06 ENCOUNTER — Encounter: Payer: Self-pay | Admitting: Emergency Medicine

## 2020-01-06 DIAGNOSIS — J3089 Other allergic rhinitis: Secondary | ICD-10-CM | POA: Diagnosis not present

## 2020-01-06 DIAGNOSIS — J441 Chronic obstructive pulmonary disease with (acute) exacerbation: Secondary | ICD-10-CM | POA: Diagnosis not present

## 2020-01-06 MED ORDER — PREDNISONE 10 MG PO TABS: ORAL_TABLET | ORAL | 0 refills | Status: DC

## 2020-01-06 MED ORDER — SPIRIVA RESPIMAT 2.5 MCG/ACT IN AERS: 2.0000 | INHALATION_SPRAY | Freq: Every day | RESPIRATORY_TRACT | 12 refills | Status: DC

## 2020-01-06 MED ORDER — AZITHROMYCIN 250 MG PO TABS: ORAL_TABLET | ORAL | 0 refills | Status: AC

## 2020-01-06 NOTE — Addendum Note (Signed)
Addended by: Desmond Dike C on: 01/06/2020 09:31 AM   Modules accepted: Orders

## 2020-01-06 NOTE — Assessment & Plan Note (Signed)
Currently on Singulair, Xyzal, fluticasone nasal spray once daily.  She has benefited from [place] and will continue this, continue to follow with allergy

## 2020-01-06 NOTE — Assessment & Plan Note (Signed)
She is having flaring symptoms, increase her albuterol use over the last 2 weeks, more wheezing.  Most recently increased thick mucus but without purulence.  I do believe she needs to be treated for an acute exacerbation with azithromycin and prednisone.  We will add Spiriva back to her bronchodilator regimen as she is only currently on Symbicort.  She is using guaifenesin as needed and I agree with this.  Congratulated her on not restarting her smoking. She feels that she is overall benefited from [place], being managed by her allergist and I agree with continuing.  Continue her other allergy regimen as below.  Discussed vaccines with her, in particular she is a candidate for the COVID-19 vaccine and I recommended that she receive it.  She needs a pneumonia vaccine after age 65.

## 2020-01-06 NOTE — Progress Notes (Signed)
Virtual Visit via Video Note  I connected with [name] on 01/06/20 at  9:00 AM EST by a video enabled telemedicine application and verified that I am speaking with the correct person using two identifiers.  Location: Patient: Home Provider: Office   I discussed the limitations of evaluation and management by telemedicine and the availability of in person appointments. The patient expressed understanding and agreed to proceed.  History of Present Illness: [name] is 59, has a history of tobacco, bipolar disorder, severe persistent allergic rhinitis for which she has been treated with Biologics, currently on Dupixent.  We have followed her for COPD with severe obstruction by spirometry. Current allergy medical regimen includes Singulair, Xyzal, fluticasone, Dupixent > due for injection Friday    Observations/Objective: She reports that her cough has been more prominent over the winter, was treated with pred and doxy in October. She now reports 1 week of HA, neck pain, increased cough with thick clear mucous. No fevers, possibly some dizziness.  She believes that the [place] has helped her dyspnea some, may have also decreased her mucous some.  She has not restarted smoking.  She is using Symbicort, not Spiriva. She is using albuterol nebs tid, an increase  Assessment and Plan: COPD/asthma She is having flaring symptoms, increase her albuterol use over the last 2 weeks, more wheezing.  Most recently increased thick mucus but without purulence.  I do believe she needs to be treated for an acute exacerbation with azithromycin and prednisone.  We will add Spiriva back to her bronchodilator regimen as she is only currently on Symbicort.  She is using guaifenesin as needed and I agree with this.  Congratulated her on not restarting her smoking. She feels that she is overall benefited from [place], being managed by her allergist and I agree with continuing.  Continue her other  allergy regimen as below.  Discussed vaccines with her, in particular she is a candidate for the COVID-19 vaccine and I recommended that she receive it.  She needs a pneumonia vaccine after age 59.  Perennial allergic rhinitis Currently on Singulair, Xyzal, fluticasone nasal spray once daily.  She has benefited from [place] and will continue this, continue to follow with allergy   Follow Up Instructions: Video visit in 1 month   I discussed the assessment and treatment plan with the patient. The patient was provided an opportunity to ask questions and all were answered. The patient agreed with the plan and demonstrated an understanding of the instructions.   The patient was advised to call back or seek an in-person evaluation if the symptoms worsen or if the condition fails to improve as anticipated.  I provided 25 minutes of non-face-to-face time during this encounter.   Collene Gobble, MD

## 2020-01-08 ENCOUNTER — Other Ambulatory Visit: Payer: Self-pay

## 2020-01-08 ENCOUNTER — Ambulatory Visit (INDEPENDENT_AMBULATORY_CARE_PROVIDER_SITE_OTHER): Payer: Medicare HMO | Admitting: *Deleted

## 2020-01-08 DIAGNOSIS — J4540 Moderate persistent asthma, uncomplicated: Secondary | ICD-10-CM | POA: Diagnosis not present

## 2020-01-19 ENCOUNTER — Ambulatory Visit: Payer: Medicare HMO | Admitting: Diagnostic Neuroimaging

## 2020-01-20 ENCOUNTER — Ambulatory Visit (INDEPENDENT_AMBULATORY_CARE_PROVIDER_SITE_OTHER): Payer: Medicare HMO | Admitting: Psychiatry

## 2020-01-20 ENCOUNTER — Other Ambulatory Visit: Payer: Self-pay

## 2020-01-20 DIAGNOSIS — F310 Bipolar disorder, current episode hypomanic: Secondary | ICD-10-CM

## 2020-01-20 MED ORDER — HYDROXYZINE PAMOATE 25 MG PO CAPS: ORAL_CAPSULE | ORAL | 2 refills | Status: DC
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
## 2020-01-21 ENCOUNTER — Ambulatory Visit (HOSPITAL_COMMUNITY): Payer: Medicare HMO | Admitting: Psychiatry

## 2020-01-22 ENCOUNTER — Other Ambulatory Visit: Payer: Self-pay

## 2020-01-22 ENCOUNTER — Ambulatory Visit (INDEPENDENT_AMBULATORY_CARE_PROVIDER_SITE_OTHER): Payer: Medicare HMO | Admitting: *Deleted

## 2020-01-22 DIAGNOSIS — J4540 Moderate persistent asthma, uncomplicated: Secondary | ICD-10-CM

## 2020-02-02 DIAGNOSIS — M47812 Spondylosis without myelopathy or radiculopathy, cervical region: Secondary | ICD-10-CM | POA: Insufficient documentation

## 2020-02-02 DIAGNOSIS — M542 Cervicalgia: Secondary | ICD-10-CM | POA: Diagnosis not present

## 2020-02-02 DIAGNOSIS — M4302 Spondylolysis, cervical region: Secondary | ICD-10-CM | POA: Diagnosis not present

## 2020-02-02 DIAGNOSIS — M5412 Radiculopathy, cervical region: Secondary | ICD-10-CM | POA: Insufficient documentation

## 2020-02-05 ENCOUNTER — Telehealth (INDEPENDENT_AMBULATORY_CARE_PROVIDER_SITE_OTHER): Payer: Medicare HMO | Admitting: Emergency Medicine

## 2020-02-05 ENCOUNTER — Ambulatory Visit (INDEPENDENT_AMBULATORY_CARE_PROVIDER_SITE_OTHER): Payer: Medicare HMO | Admitting: *Deleted

## 2020-02-05 ENCOUNTER — Other Ambulatory Visit: Payer: Self-pay

## 2020-02-05 ENCOUNTER — Encounter: Payer: Self-pay | Admitting: Emergency Medicine

## 2020-02-05 DIAGNOSIS — J441 Chronic obstructive pulmonary disease with (acute) exacerbation: Secondary | ICD-10-CM

## 2020-02-05 DIAGNOSIS — J4540 Moderate persistent asthma, uncomplicated: Secondary | ICD-10-CM

## 2020-02-05 MED ORDER — ALBUTEROL SULFATE HFA 108 (90 BASE) MCG/ACT IN AERS: INHALATION_SPRAY | RESPIRATORY_TRACT | 2 refills | Status: DC

## 2020-02-05 NOTE — Progress Notes (Signed)
Virtual Visit via Telephone Note  I connected with [name] on 02/05/20 at  9:00 AM EST by a video enabled telemedicine application and verified that I am speaking with the correct person using two identifiers.  Location: Patient: Home Provider: Home Office   I discussed the limitations of evaluation and management by telemedicine and the availability of in person appointments. The patient expressed understanding and agreed to proceed.  History of Present Illness: 59 year old woman with a history of tobacco, bipolar disease, severe persistent allergic rhinitis.  She has severe obstruction by spirometry and we have been treating her for COPD.  She is also on Dupixent for her severe allergic disease as well as Singulair, Xyzal, fluticasone nasal spray, guaifenesin as needed   Observations/Objective: She was treated 1 month ago for an acute exacerbation of her COPD with azithromycin and prednisone.  We also added back Spiriva to her Symbicort.  Still has some cough, better. Prod of clear. Still has some breakthrough GERD depending on what she eats.   Wants to get COVID vaccine when available. I reassured her about the risks and benefits-she has some trepidation-but I believe she was reassured  She is concerned that she is not taking the Spiriva correctly, feels that she needs some instruction on this.  Assessment and Plan: COPD -Continue Symbicort -I will arrange for nursing or pharmacy visit at our office to give her coaching on how to do the Spiriva Respimat correctly -She needs a refill on albuterol to use as needed  Severe allergic rhinitis -Continue her current regimen of Singulair, Xyzal, fluticasone nasal spray, guaifenesin, Dupixent injections  Chronic cough -Treating allergies as above -I asked her to increase her Protonix to twice a day for 2 weeks, then go back to once a week and see how this impacts her cough.   Follow Up Instructions: 3 months   I  discussed the assessment and treatment plan with the patient. The patient was provided an opportunity to ask questions and all were answered. The patient agreed with the plan and demonstrated an understanding of the instructions.   The patient was advised to call back or seek an in-person evaluation if the symptoms worsen or if the condition fails to improve as anticipated.  I provided 15 minutes of non-face-to-face time during this encounter.   Collene Gobble, MD

## 2020-02-08 NOTE — Progress Notes (Signed)
Subjective:  Patient presents today to [facility] to see pharmacy team for inhaler education and optimization.  Past medical history includes COPD with asthma overlap, severe persistent allergic rhinitis, history of tobacco abuse, GERD, HTN, peripheral vascular disease, vertigo, fibromyalgia and history of seizures.   Respiratory Medications Current: Symbicort, Spiriva,Ventolin HFA, Dupixent Tried in past: Combivent, Advair Diskus, Dulera, Incurse Ellipta, Trelegy Ellipta, Duoneb, Astelin nasal spray, Zyrtec, Claritin, Xolair Patient reports adherence challenges   Objective: Allergies  Allergen Reactions  . Codeine Nausea And Vomiting  . Sulfa Antibiotics Nausea And Vomiting  . Sulfonamide Derivatives Nausea And Vomiting    REACTION: n/v    Outpatient Encounter Medications as of 02/10/2020  Medication Sig  . albuterol (PROVENTIL) (2.5 MG/3ML) 0.083% nebulizer solution Take 3 mLs (2.5 mg total) by nebulization every 4 (four) hours as needed for wheezing or shortness of breath. Reported on 06/04/2016  . albuterol (VENTOLIN HFA) 108 (90 Base) MCG/ACT inhaler INHALE 1 TO 2 PUFFS BY MOUTH EVERY 4 TO 6 HOURS AS NEEDED  . budesonide-formoterol (SYMBICORT) 160-4.5 MCG/ACT inhaler INHALE 2 PUFFS INTO THE LUNGS TWICE DAILY  . carbamazepine (TEGRETOL) 200 MG tablet Take 2 tablets (400 mg total) by mouth 2 (two) times daily.  . clindamycin (CLINDAGEL) 1 % gel Apply topically daily as needed.  . cyclobenzaprine (FLEXERIL) 5 MG tablet Take 1 tablet (5 mg total) by mouth 3 (three) times daily as needed for muscle spasms.  . DUPIXENT 300 MG/2ML SOSY INJECT 600MG  (2 SYRINGES)  SUBCUTANEOUSLY ON DAY 1,  300MG  (1 SYRINGE) DAY 15,  THEN EVERY OTHER WEEK  THEREAFTER  . estradiol (ESTRACE) 0.5 MG tablet Take 1 tablet (0.5 mg total) by mouth daily.  . famotidine (PEPCID) 20 MG tablet Take 1 tablet (20 mg total) by mouth 2 (two) times daily.  . fluticasone (FLONASE) 50 MCG/ACT nasal spray SHAKE LIQUID  AND USE 2 SPRAYS IN EACH NOSTRIL DAILY  . guaiFENesin-dextromethorphan (ROBITUSSIN DM) 100-10 MG/5ML syrup Take 5 mLs by mouth every 4 (four) hours as needed for cough.  . hydrochlorothiazide (MICROZIDE) 12.5 MG capsule Take 1 capsule (12.5 mg total) by mouth daily.  . hydrOXYzine (VISTARIL) 25 MG capsule 1   qam  1 q noon 2 qhs  1  PRN qday  . irbesartan-hydrochlorothiazide (AVALIDE) 150-12.5 MG tablet Take 1 tablet by mouth daily.  Marland Kitchen lactulose, encephalopathy, (GENERLAC) 10 GM/15ML SOLN TAKE 45 TO 90 ML( 30-60 GM) BY MOUTH TWICE DAILY AS NEEDED  . levocetirizine (XYZAL) 5 MG tablet Take 1 tablet (5 mg total) by mouth 2 (two) times daily as needed for allergies (itching).  . meclizine (ANTIVERT) 12.5 MG tablet TAKE 1 TABLET(12.5 MG) BY MOUTH THREE TIMES DAILY AS NEEDED FOR DIZZINESS  . montelukast (SINGULAIR) 10 MG tablet Take 1 tablet (10 mg total) by mouth at bedtime.  . pantoprazole (PROTONIX) 40 MG tablet 1 by mouth once daily  . pravastatin (PRAVACHOL) 40 MG tablet TAKE 1 TABLET(40 MG) BY MOUTH DAILY  . predniSONE (DELTASONE) 10 MG tablet Take 4 tablets for 3 days, 3 tablets for 3 days, 2 tablets for 3 days, 1 tablet for 3 days  . SYMBICORT 80-4.5 MCG/ACT inhaler INHALE 2 PUFFS INTO THE LUNGS TWICE DAILY  . Tiotropium Bromide Monohydrate (SPIRIVA RESPIMAT) 2.5 MCG/ACT AERS Inhale 2 puffs into the lungs daily.  Marland Kitchen tiZANidine (ZANAFLEX) 4 MG tablet TAKE 1 TABLET(4 MG) BY MOUTH EVERY 6 HOURS AS NEEDED FOR MUSCLE SPASMS (Patient taking differently: Take 4 mg by mouth every 6 (six)  hours as needed for muscle spasms. )  . triamcinolone cream (KENALOG) 0.1 % Apply topically 2 (two) times daily.  . vitamin C (ASCORBIC ACID) 500 MG tablet Take 500 mg by mouth daily.  . Vitamin D, Ergocalciferol, (DRISDOL) 1.25 MG (50000 UT) CAPS capsule Take 1 capsule (50,000 Units total) by mouth every 7 (seven) days.   Facility-Administered Encounter Medications as of 02/10/2020  Medication  . dupilumab (DUPIXENT)  prefilled syringe 300 mg     Immunization History  Administered Date(s) Administered  . Influenza Split 10/23/2011  . Influenza Whole 09/16/2012  . Influenza,inj,Quad PF,6+ Mos 10/18/2014, 08/31/2015, 09/25/2016, 10/09/2017, 09/23/2018, 08/12/2019  . Pneumococcal Polysaccharide-23 12/02/2015  . Td 04/22/2014     PFTs PFT Results Latest Ref Rng & Units 05/02/2018  FVC-Pre L 1.92  FVC-Predicted Pre % 75  Pre FEV1/FVC % % 55  FEV1-Pre L 1.06  FEV1-Predicted Pre % 52    Eosinophils Most recent blood eosinophil count was 0.2 cells/microL taken on 07/31/2019.   IgE 90 IU/mL on 12/23/2018   Assessment and Plan  1. Inhaler Education  Patient had issues with using her Spiriva Respimat inhaler.  Demonstrated proper inhaler technique using Respimat demo inhaler.  Patient able to demonstrate proper inhaler technique using teach back method.  Patient was counseled on the purpose, proper use, and adverse effects of Spiriva Respimat. Instructed patient to take 2 puffs once a day. Patient verbalized understanding.  Patient was counseled on the purpose, proper use, and adverse effects of Symbicort inhaler.  Instructed patient to rinse mouth with water after using in order to prevent fungal infection.  Patient verbalized understanding.  Reviewed appropriate use of maintenance vs rescue inhalers.  Stressed importance of using maintenance inhaler daily and rescue inhaler only as needed.  Patient verbalized understanding.  She denies any issues affording medications.  2. Medication Reconciliation  A drug regimen assessment was performed, including review of allergies, interactions, disease-state management, dosing and immunization history. Medications were reviewed with the patient, including name, instructions, indication, goals of therapy, potential side effects, importance of adherence, and safe use.  Drug interaction(s):  i. QTc prolongation- hydroxyzine, cyclobenzaprine, famotidine,  Symbicort 1. EKG on 01/07/2019 showed QTc of 440 ii. Increased anticholinergic effects- Spiriva, tizanidine, meclizine, hydroxyzine, cyclobenzaprine  3. Immunizations  Patient is up to date with annual influenza. Patient is eligible for another Pneumovax 23 vaccine.  Recommend Shingrix vaccine and COVID-19 vaccine when available.  This appointment required 45 minutes of patient care (this includes precharting, chart review, review of results, face-to-face care, etc.).  Mariella Saa, PharmD, [place], [place] Clinical Specialty Pharmacist [phone]  02/10/2020 4:27 PM

## 2020-02-10 ENCOUNTER — Other Ambulatory Visit: Payer: Self-pay

## 2020-02-10 ENCOUNTER — Ambulatory Visit (INDEPENDENT_AMBULATORY_CARE_PROVIDER_SITE_OTHER): Payer: Medicare HMO | Admitting: Pharmacist

## 2020-02-10 DIAGNOSIS — Z79899 Other long term (current) drug therapy: Secondary | ICD-10-CM

## 2020-02-11 DIAGNOSIS — M4312 Spondylolisthesis, cervical region: Secondary | ICD-10-CM | POA: Diagnosis not present

## 2020-02-11 DIAGNOSIS — M5021 Other cervical disc displacement,  high cervical region: Secondary | ICD-10-CM | POA: Diagnosis not present

## 2020-02-11 DIAGNOSIS — M50221 Other cervical disc displacement at C4-C5 level: Secondary | ICD-10-CM | POA: Diagnosis not present

## 2020-02-11 DIAGNOSIS — M4302 Spondylolysis, cervical region: Secondary | ICD-10-CM | POA: Diagnosis not present

## 2020-02-16 DIAGNOSIS — M4712 Other spondylosis with myelopathy, cervical region: Secondary | ICD-10-CM | POA: Diagnosis not present

## 2020-02-19 ENCOUNTER — Ambulatory Visit (INDEPENDENT_AMBULATORY_CARE_PROVIDER_SITE_OTHER): Payer: Medicare HMO | Admitting: *Deleted

## 2020-02-19 ENCOUNTER — Other Ambulatory Visit: Payer: Self-pay

## 2020-02-19 DIAGNOSIS — J4540 Moderate persistent asthma, uncomplicated: Secondary | ICD-10-CM | POA: Diagnosis not present

## 2020-02-28 ENCOUNTER — Other Ambulatory Visit: Payer: Self-pay | Admitting: Allergy & Immunology

## 2020-02-28 DIAGNOSIS — J4550 Severe persistent asthma, uncomplicated: Secondary | ICD-10-CM

## 2020-02-28 DIAGNOSIS — L501 Idiopathic urticaria: Secondary | ICD-10-CM

## 2020-02-29 ENCOUNTER — Ambulatory Visit: Payer: Medicare HMO | Attending: Physical Medicine and Rehabilitation | Admitting: Physical Therapy

## 2020-02-29 ENCOUNTER — Encounter: Payer: Self-pay | Admitting: Physical Therapy

## 2020-02-29 ENCOUNTER — Other Ambulatory Visit: Payer: Self-pay

## 2020-02-29 DIAGNOSIS — M62838 Other muscle spasm: Secondary | ICD-10-CM | POA: Diagnosis not present

## 2020-02-29 DIAGNOSIS — M5412 Radiculopathy, cervical region: Secondary | ICD-10-CM | POA: Insufficient documentation

## 2020-02-29 DIAGNOSIS — M4712 Other spondylosis with myelopathy, cervical region: Secondary | ICD-10-CM | POA: Diagnosis not present
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
## 2020-03-02 ENCOUNTER — Other Ambulatory Visit: Payer: Self-pay

## 2020-03-02 ENCOUNTER — Ambulatory Visit (INDEPENDENT_AMBULATORY_CARE_PROVIDER_SITE_OTHER): Payer: Medicare HMO | Admitting: Psychiatry

## 2020-03-02 DIAGNOSIS — F3160 Bipolar disorder, current episode mixed, unspecified: Secondary | ICD-10-CM | POA: Diagnosis not present

## 2020-03-02 MED ORDER — CARBAMAZEPINE 200 MG PO TABS: 400.0000 mg | ORAL_TABLET | Freq: Two times a day (BID) | ORAL | 12 refills | Status: DC

## 2020-03-02 MED ORDER — HYDROXYZINE PAMOATE 25 MG PO CAPS: ORAL_CAPSULE | ORAL | 2 refills | Status: DC
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
## 2020-03-03 ENCOUNTER — Ambulatory Visit: Payer: Medicare HMO | Admitting: Physical Therapy

## 2020-03-03 ENCOUNTER — Encounter: Payer: Self-pay | Admitting: Physical Therapy

## 2020-03-03 ENCOUNTER — Telehealth: Payer: Self-pay | Admitting: Internal Medicine

## 2020-03-03 ENCOUNTER — Other Ambulatory Visit: Payer: Self-pay

## 2020-03-03 DIAGNOSIS — M4712 Other spondylosis with myelopathy, cervical region: Secondary | ICD-10-CM

## 2020-03-03 DIAGNOSIS — M62838 Other muscle spasm: Secondary | ICD-10-CM | POA: Diagnosis not present

## 2020-03-03 DIAGNOSIS — M5412 Radiculopathy, cervical region: Secondary | ICD-10-CM

## 2020-03-03 NOTE — Therapy (Signed)
[place], [place], [address] Phone: [phone]   Fax:  [phone]  Physical Therapy Treatment  Patient Details  Name: [name] MRN: [id number] Date of Birth: October 12, 1961 Referring Provider (PT): Dr Frederich Cha   Encounter Date: 03/03/2020  PT End of Session - 03/03/20 1416    Visit Number  2    Number of Visits  12    Date for PT Re-Evaluation  04/11/20    Authorization Type  [employer]    PT Start Time  1100    PT Stop Time  1142    PT Time Calculation (min)  42 min    Activity Tolerance  Patient tolerated treatment well    Behavior During Therapy  [facility] for tasks assessed/performed       Past Medical History:  Diagnosis Date  . ALLERGIC RHINITIS 05/12/2009   Qualifier: Diagnosis of  By: Lenn Cal Deborra Medina), Wynona Canes    . Anemia, iron deficiency 12/22/2014  . Angina   . Anxiety   . Arthritis   . Asthma   . ASTHMA 05/12/2009   Severe AFL (Spirometry 05/2009: pre-BD FEV1 0.87L 34% pred, post-BD FEV1 1.11L 44% pred) Volumes hyperinflated Decreased DLCO that does not fully correct to normal range for alveolar volume.     Marland Kitchen COPD 08/24/2009   Qualifier: Diagnosis of  By: Burnett Kanaris    . COPD (chronic obstructive pulmonary disease) ([place])   . Depression   . Fibromyalgia 05/14/2014  . GERD (gastroesophageal reflux disease)   . Hyperlipidemia 04/20/2017  . HYPERTENSION 05/12/2009   Qualifier: Diagnosis of  By: Lenn Cal Deborra Medina), Wynona Canes    . Migraine   . Nephrolithiasis   . Peripheral vascular disease ([place])   . Prediabetes 02/23/2014  . Seizure ([place])   . Seizure ([place])   . Urticaria     Past Surgical History:  Procedure Laterality Date  . ABDOMINAL HYSTERECTOMY    . BACK SURGERY    . COLONOSCOPY  12/20/2011   Procedure: COLONOSCOPY;  Surgeon: Landry Dyke, MD;  Location: [facility];  Service: Endoscopy;  Laterality: N/A;  . COLONOSCOPY  03/05/2012   Procedure: COLONOSCOPY;  Surgeon:  Landry Dyke, MD;  Location: [facility];  Service: Endoscopy;  Laterality: N/A;  . DIAGNOSTIC LAPAROSCOPY    . HEMORRHOID SURGERY    . INCISE AND DRAIN ABCESS    . KIDNEY STONE SURGERY    . NECK SURGERY     x 2 Dr Orinda Kenner  . SPINE SURGERY  2019  . TOE SURGERY    . TUBAL LIGATION      There were no vitals filed for this visit.  Subjective Assessment - 03/03/20 1414    Subjective  Patient has not had any pain over the past day or tow. She is not having any pain down her arm. She slept well yesterday.    Pertinent History  Lumbar spien surgery (unspeicifeid in the chart), Asthma, Fibromyalgia, Seizures,    Currently in Pain?  Yes    Pain Score  6     Pain Location  Neck    Pain Orientation  Left    Pain Descriptors / Indicators  Aching    Pain Type  Chronic pain    Pain Onset  More than a month ago    Pain Frequency  Intermittent    Aggravating Factors   bending her head forward    Pain Relieving Factors  neck brace and heat  Multiple Pain Sites  No                               PT Education - 03/03/20 1415    Education Details  HEP and symptom mangement    Person(s) Educated  Patient    Methods  Explanation;Demonstration;Tactile cues;Verbal cues    Comprehension  Verbalized understanding;Returned demonstration;Verbal cues required;Tactile cues required       PT Short Term Goals - 03/03/20 1418      PT SHORT TERM GOAL #1   Title  Patient will increase cervical extension by 10 degrees    Time  3    Period  Weeks    Status  New    Target Date  03/21/20      PT SHORT TERM GOAL #2   Title  Patient will increase bilateral cervical rotation by 20 degrees bilateral    Time  3    Period  Weeks    Status  On-going    Target Date  03/21/20      PT SHORT TERM GOAL #3   Title  Patient will increase gross left grip strength to 20 lbs    Baseline  not tested    Time  3    Period  Weeks    Status  On-going    Target Date  03/21/20         PT Long Term Goals - 02/29/20 0946      PT LONG TERM GOAL #1   Title  Patient will report no radicular pain down her left arm whilre carrying grocery bags    Time  6    Period  Weeks    Status  New    Target Date  04/11/20      PT LONG TERM GOAL #2   Title  Patient will increase bilateral cervical rotation to 60 degrees in order to improve community safety    Time  6    Period  Weeks    Status  New    Target Date  04/11/20      PT LONG TERM GOAL #3   Title  Patient will increase pain free cervical flexion and extension by 20 degrees in order to use a computer without pain    Time  6    Period  Weeks    Status  New    Target Date  04/11/20            Plan - 03/03/20 1416    Clinical Impression Statement  Patient is making good progress. she had a much better tolerance to manual therapy today. She was given light stretching and exercises for home. She did well with stretching with min to mod VC's She tolerated exercises. Therapy will continue to advance as tolerated.    Personal Factors and Comorbidities  Comorbidity 1;Comorbidity 2;Comorbidity 3+    Comorbidities  astham, lumbar fusion, fibromyalgia, depression    Examination-Activity Limitations  Bed Mobility;Locomotion Level;Lift;Reach Overhead;Carry    Examination-Participation Restrictions  Laundry;Cleaning;Community Activity;Shop    Stability/Clinical Decision Making  Evolving/Moderate complexity    Clinical Decision Making  Moderate    Rehab Potential  Good    PT Frequency  2x / week    PT Duration  6 weeks    PT Treatment/Interventions  ADLs/Self Care Home Management;Electrical Stimulation;Iontophoresis 4mg /ml Dexamethasone;Moist Heat;Functional mobility training;Therapeutic activities;Therapeutic exercise;Neuromuscular re-education;Patient/family education;Manual techniques;Passive range of motion;Taping;Dry needling    PT Next  Visit Plan  begin light stretching and scap retraction; begin light manual therapy.  Patient had a minor increase in pain today with soft tissue mobilization to upper trap and peri-scpaular area. consider light manual traction    PT Home Exercise Plan  reviewed relaxation techniques, cervical rotation 3x in pain free range and sefl soft tissue mobilization to the upper trap    Consulted and Agree with Plan of Care  Patient       Patient will benefit from skilled therapeutic intervention in order to improve the following deficits and impairments:  Decreased range of motion, Increased fascial restricitons, Increased muscle spasms, Impaired UE functional use, Decreased activity tolerance, Pain, Decreased strength, Improper body mechanics, Postural dysfunction  Visit Diagnosis: Radiculopathy, cervical region  Other muscle spasm  Cervical spondylosis with myelopathy     Problem List Patient Active Problem List   Diagnosis Date Noted  . Leg cramping 08/12/2019  . Hypophosphatemia 01/07/2019  . Vertigo 09/23/2018  . Dysfunction of left eustachian tube 09/23/2018  . Gait disorder 04/21/2018  . Leukocytosis 04/15/2018  . Nausea & vomiting 04/15/2018  . Seizure ([place])   . Cervical radiculopathy 02/18/2018  . 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 Syncope 09/04/2017  . Constipation 09/04/2017  . Nonintractable headache 09/04/2017  . Leg swelling 07/26/2017  . 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 Hyperlipidemia 04/20/2017  . Pedal edema 04/19/2017  . Bipolar I disorder ([place]) 09/19/2016  . Facial pain 07/12/2016  . Rash 06/14/2016  . Migraine 01/25/2016  . Idiopathic urticaria/pruritus 01/23/2016  . Perennial allergic rhinitis 01/23/2016  . Oral candidiasis 01/23/2016  . Greater trochanteric bursitis of both hips 09/08/2015  . Chest pain 06/22/2015  . Itching 06/22/2015  . Bilateral knee pain 06/22/2015  . Bilateral hip pain 06/22/2015  . Anemia, iron deficiency 12/22/2014  . Fatigue 12/21/2014  . Intertrigo 08/07/2014  . Dyspareunia 07/12/2014  .  Menopausal syndrome (hot flushes) 07/12/2014  . History of tobacco abuse 07/12/2014  . Dizziness 06/22/2014  . Weakness 06/22/2014  . Fibromyalgia 05/14/2014  . Preventative health care 04/22/2014  . Recurrent boils 04/22/2014  . Recurrent falls 04/22/2014  . Peripheral vascular disease ([place])   . Depression   . Anxiety   . GERD (gastroesophageal reflux disease)   . Prediabetes 02/23/2014  . Screening mammogram for high-risk patient 02/23/2014  . Back pain 07/22/2013  . COPD/asthma 08/24/2009  . Headache(784.0) 08/24/2009  . HTN (hypertension) 05/12/2009    Carney Living PT DPT  03/03/2020, 2:19 PM  [facility] [address] [place], [place], [address] Phone: [phone]   Fax:  [phone]  Name: JOSELYNN RICHMEIER MRN: [id number] Date of Birth: [birth year]

## 2020-03-03 NOTE — Telephone Encounter (Signed)
New message:   Pt is calling and would like to get the pneumonia shot.

## 2020-03-04 ENCOUNTER — Ambulatory Visit (INDEPENDENT_AMBULATORY_CARE_PROVIDER_SITE_OTHER): Payer: Medicare HMO

## 2020-03-04 DIAGNOSIS — J4540 Moderate persistent asthma, uncomplicated: Secondary | ICD-10-CM | POA: Diagnosis not present

## 2020-03-07 ENCOUNTER — Ambulatory Visit (INDEPENDENT_AMBULATORY_CARE_PROVIDER_SITE_OTHER): Payer: Medicare HMO | Admitting: Primary Care

## 2020-03-07 ENCOUNTER — Encounter: Payer: Self-pay | Admitting: Primary Care

## 2020-03-07 ENCOUNTER — Other Ambulatory Visit: Payer: Self-pay

## 2020-03-07 DIAGNOSIS — R05 Cough: Secondary | ICD-10-CM | POA: Diagnosis not present

## 2020-03-07 DIAGNOSIS — R053 Chronic cough: Secondary | ICD-10-CM

## 2020-03-07 DIAGNOSIS — J3089 Other allergic rhinitis: Secondary | ICD-10-CM

## 2020-03-07 DIAGNOSIS — J441 Chronic obstructive pulmonary disease with (acute) exacerbation: Secondary | ICD-10-CM

## 2020-03-07 MED ORDER — PANTOPRAZOLE SODIUM 40 MG PO TBEC: DELAYED_RELEASE_TABLET | ORAL | 3 refills | Status: DC

## 2020-03-07 MED ORDER — MONTELUKAST SODIUM 10 MG PO TABS: 10.0000 mg | ORAL_TABLET | Freq: Every day | ORAL | 5 refills | Status: DC

## 2020-03-07 MED ORDER — PREDNISONE 10 MG PO TABS: ORAL_TABLET | ORAL | 0 refills | Status: DC

## 2020-03-07 MED ORDER — BUDESONIDE-FORMOTEROL FUMARATE 80-4.5 MCG/ACT IN AERO: 2.0000 | INHALATION_SPRAY | Freq: Two times a day (BID) | RESPIRATORY_TRACT | 3 refills | Status: DC

## 2020-03-07 NOTE — Patient Instructions (Signed)
  Acute exacerbation, COPD/asmtha: - RX prednisone taper, holding off on abx at this time - Continue Symbicort 80 two puffs twice daily; Spiriva respimat once daily - Continue albuterol hfa/neb q 6 hours prn breakthrough shortness of breath/wheezing   Allergic rhinitis: - Continue Singulair, Xyzal, Flonase and dupixent q 2 weeks  Chronic cough  - Continue Protonix 40mg  once daily and Famotidine 20mg  twice daily   Polypharmacy:  - Refer to pharmacy for medication reconciliation  - Please bring all medication with you to pharmacy visit

## 2020-03-07 NOTE — Addendum Note (Signed)
Addended by: Doroteo Glassman D on: 03/07/2020 02:32 PM   Modules accepted: Orders

## 2020-03-07 NOTE — Progress Notes (Signed)
Virtual Visit via Telephone Note  I connected with [name] on 03/07/20 at 11:00 AM EDT by telephone and verified that I am speaking with the correct person using two identifiers.  Location: Patient: Home Provider: Office   I discussed the limitations, risks, security and privacy concerns of performing an evaluation and management service by telephone and the availability of in person appointments. I also discussed with the patient that there may be a patient responsible charge related to this service. The patient expressed understanding and agreed to proceed.   History of Present Illness: 59 year old female, former smoker. PMH COPD/asthma overlap syndrome, perennial allergic rhinitis, HTN, GERD, pituitary cyst, seizure. Patient of Dr. Lamonte Sakai, last seen for virtual visit on 02/10/20. Spiriva respimat added back in January 2021. Maintained on Symbicort 80 twice daily, Dupixent 300mg  injection q2 weeks, Xyzal, flonase and Singulair 10mg .   03/07/2020 Patient contacted today for acute televisit for cough, shortness of breath and chest tightness. Associated chills, runny nose, voice hoarseness and shortness of breath. Her upper respiratory symptoms started Thursday March 18th. She helped her daughter move yesterday. Her grandchildren were sick with stomach virus. Reports compliance with symbicort 80 twice daily and spiriva once daily. She is not currently taking Singulair. Over the weekend she needed to use albuterol hfa three times for chest tightness and wheezing. She continues with Dupixent injections every 2 weeks at outside office. She has received her first covid vaccine march 10th, due for second injection on 3/31. She needs refills of symbicort and singulair. Patient states at the end, "To be honest with you, I'm just so confused and stressed".  Observations/Objective:  - Able to speak in full sentences; no shortness of breath, wheezing or cough   Assessment and Plan:  Acute  exacerbation, COPD/asmtha: - Increased chest tightness and sob with allergic rhinitis symptoms  - RX prednisone taper, holding off on abx at this time - Continue Symbicort 80 two puffs twice daily; Spiriva respimat once daily - Continue albuterol hfa/neb q 6 hours prn breakthrough shortness of breath/wheezing   Allergic rhinitis: - Patient reports increased runny nose and voice hoarseness x 4-5 days - Continue Singulair, Xyzal, Flonase and dupixent q 2 weeks - No currently taking Singulair, needs refill   Chronic cough  - Continue Protonix 40mg  once daily and Famotidine 20mg  twice daily  - Patient not taking protonix, needs refill  Polypharmacy:  - Patient has some confusion regarding her medications and why she is taking them  - Refer to pharmacy for medication reconciliation  - Asked patient to bring all medication with her to pharmacy visit    Follow Up Instructions:   - As needed if symptoms do not improve or worsen/ pharmacy consult   I discussed the assessment and treatment plan with the patient. The patient was provided an opportunity to ask questions and all were answered. The patient agreed with the plan and demonstrated an understanding of the instructions.   The patient was advised to call back or seek an in-person evaluation if the symptoms worsen or if the condition fails to improve as anticipated.  I provided 22 minutes of non-face-to-face time during this encounter.   Martyn Ehrich, NP

## 2020-03-09 ENCOUNTER — Other Ambulatory Visit: Payer: Self-pay

## 2020-03-10 ENCOUNTER — Ambulatory Visit (INDEPENDENT_AMBULATORY_CARE_PROVIDER_SITE_OTHER): Payer: Medicare HMO | Admitting: Obstetrics & Gynecology

## 2020-03-10 ENCOUNTER — Encounter: Payer: Self-pay | Admitting: Obstetrics & Gynecology

## 2020-03-10 VITALS — BP 134/84

## 2020-03-10 DIAGNOSIS — L723 Sebaceous cyst: Secondary | ICD-10-CM | POA: Diagnosis not present

## 2020-03-10 DIAGNOSIS — Z113 Encounter for screening for infections with a predominantly sexual mode of transmission: Secondary | ICD-10-CM

## 2020-03-10 MED ORDER — CLINDAMYCIN PHOSPHATE 1 % EX GEL: Freq: Every day | CUTANEOUS | 3 refills | Status: DC | PRN

## 2020-03-10 NOTE — Progress Notes (Signed)
Patient referred by Geraldo Pitter, NP, on 03/07/2020 for a medication reconciliation. PMH is significant for COPD/asthma, allergic rhinitis, GERD, HTN, HLD, PVD, migraine, prediabetes, depression, anxiety, bipolar 1 diosrder, fibromyalgia, and seizures.  Patient is taking a few medications differently than prescribed, specifically carbamazepine and famotidine. She does not rinse her mouth out after using Symbicort. If she develops thrush (which she confirms she has in the past) she will scrub her tongue with baking soda until it bleeds, which makes the thrush go away. Patient takes 2 puffs of Spiriva and Symbicort right after each other - does not wait 30 sec to 1 min in between doses. She uses pill box to fill medications to help with adherence, which she brought to appt. There are random days filled with medications and random days without medications. Sometimes her daughter helps her fill her pill box. She is unsure of what multiple medications are being used for. She specifies she would like to know which are the most important medications to take. She often misses multiple days of medications due to depression. She is currently stressed considering her son moved back in to her house and constantly has friends going in and out of the house.   Objective:   CrCl: 91 mL/min (adjBW)   EGFR (07/31/19): 115 mL/min/1.29m2  Allergies  Allergen Reactions  . Codeine Nausea And Vomiting  . Sulfa Antibiotics Nausea And Vomiting  . Sulfonamide Derivatives Nausea And Vomiting    REACTION: n/v    Outpatient Encounter Medications as of 03/14/2020  Medication Sig Note  . albuterol (PROVENTIL) (2.5 MG/3ML) 0.083% nebulizer solution Take 3 mLs (2.5 mg total) by nebulization every 4 (four) hours as needed for wheezing or shortness of breath. Reported on 06/04/2016 02/10/2020: Last used 3 weeks ago  . albuterol (VENTOLIN HFA) 108 (90 Base) MCG/ACT inhaler INHALE 1 TO 2 PUFFS BY MOUTH EVERY 4 TO 6 HOURS AS NEEDED  02/10/2020: Last used one month.  .Marland KitchenBIOTIN PO Take 1 tablet by mouth daily.   . budesonide-formoterol (SYMBICORT) 80-4.5 MCG/ACT inhaler Inhale 2 puffs into the lungs 2 (two) times daily.   . carbamazepine (TEGRETOL) 200 MG tablet Take 2 tablets (400 mg total) by mouth 2 (two) times daily.   . clindamycin (CLINDAGEL) 1 % gel Apply topically daily as needed.   . cyclobenzaprine (FLEXERIL) 5 MG tablet Take 1 tablet (5 mg total) by mouth 3 (three) times daily as needed for muscle spasms.   . DUPIXENT 300 MG/2ML prefilled syringe INJECT 300MG (1 SYRINGE) SUBCUTANEOUSLY EVERY 14 DAYS   . estradiol (ESTRACE) 0.5 MG tablet Take 1 tablet (0.5 mg total) by mouth daily.   . famotidine (PEPCID) 20 MG tablet Take 1 tablet (20 mg total) by mouth 2 (two) times daily.   . fluticasone (FLONASE) 50 MCG/ACT nasal spray SHAKE LIQUID AND USE 2 SPRAYS IN EACH NOSTRIL DAILY   . hydrochlorothiazide (MICROZIDE) 12.5 MG capsule Take 1 capsule (12.5 mg total) by mouth daily.   . hydrOXYzine (VISTARIL) 25 MG capsule 1   qam  1 q noon 2 qhs  1  PRN qday   . irbesartan-hydrochlorothiazide (AVALIDE) 150-12.5 MG tablet Take 1 tablet by mouth daily.   .Marland Kitchenlactulose, encephalopathy, (GENERLAC) 10 GM/15ML SOLN TAKE 45 TO 90 ML( 30-60 GM) BY MOUTH TWICE DAILY AS NEEDED   . levocetirizine (XYZAL) 5 MG tablet Take 1 tablet (5 mg total) by mouth 2 (two) times daily as needed for allergies (itching).   . meclizine (ANTIVERT) 12.5 MG tablet  TAKE 1 TABLET(12.5 MG) BY MOUTH THREE TIMES DAILY AS NEEDED FOR DIZZINESS   . montelukast (SINGULAIR) 10 MG tablet Take 1 tablet (10 mg total) by mouth at bedtime.   . pantoprazole (PROTONIX) 40 MG tablet 1 by mouth once daily   . pravastatin (PRAVACHOL) 40 MG tablet TAKE 1 TABLET(40 MG) BY MOUTH DAILY   . predniSONE (DELTASONE) 10 MG tablet Take 4 tabs po daily x 2 days; then 3 tabs for 2 days; then 2 tabs for 2 days; then 1 tab for 2 days   . Tiotropium Bromide Monohydrate (SPIRIVA RESPIMAT) 2.5  MCG/ACT AERS Inhale 2 puffs into the lungs daily.   . triamcinolone cream (KENALOG) 0.1 % Apply topically 2 (two) times daily.   . vitamin C (ASCORBIC ACID) 500 MG tablet Take 500 mg by mouth daily.   . Vitamin D, Ergocalciferol, (DRISDOL) 1.25 MG (50000 UT) CAPS capsule Take 1 capsule (50,000 Units total) by mouth every 7 (seven) days.    Facility-Administered Encounter Medications as of 03/14/2020  Medication  . dupilumab (DUPIXENT) prefilled syringe 300 mg    Lipid Panel     Component Value Date/Time   CHOL 139 06/23/2018 0947   TRIG 52.0 06/23/2018 0947   HDL 63.00 06/23/2018 0947   CHOLHDL 2 06/23/2018 0947   VLDL 10.4 06/23/2018 0947   LDLCALC 65 06/23/2018 0947    BMP Latest Ref Rng & Units 07/31/2019 01/09/2019 01/08/2019  Glucose 65 - 99 mg/dL 75 87 150(H)  BUN 6 - 24 mg/dL 11 22(H) 16  Creatinine 0.57 - 1.00 mg/dL 0.64 0.75 0.61  BUN/Creat Ratio 9 - 23 17 - -  Sodium 134 - 144 mmol/L 144 138 135  Potassium 3.5 - 5.2 mmol/L 4.0 4.2 4.4  Chloride 96 - 106 mmol/L 102 100 102  CO2 20 - 29 mmol/L 25 29 25  Calcium 8.7 - 10.2 mg/dL 8.9 9.1 9.0    Immunization History  Administered Date(s) Administered  . Influenza Split 10/23/2011  . Influenza Whole 09/16/2012  . Influenza,inj,Quad PF,6+ Mos 10/18/2014, 08/31/2015, 09/25/2016, 10/09/2017, 09/23/2018, 08/12/2019  . Pneumococcal Polysaccharide-23 12/02/2015  . Td 04/22/2014      Assessment/Plan:  Medication Nonadherence 1. Filled patient's pill box for her for this upcoming week. Reviewed each medication indication, medication dosing, and common side effects with each medication. Patient expressed appreciation.  2. [facility] - Advised patient to fill prescriptions at [facility] to utilize pill packing services ($0 charge for pill packing). Explained [facility] will fill medications in blister packs weekly and deliver to house via mail. Patient is interested, however, before setting her up with this  service she would like to discuss this with her daughter (daughter helps from time to time with meds and does not want to offend her). Will follow up in 1 week to re-assess if patient is interested in filling prescriptions at [facility] to help with adherence.   Medication Counseling 1. Symbicort - Patient does not exhale prior to using inhaler and is not waiting 30 sec to 1 min in between 2 puffs when administering 2 puffs twice daily. Patient confirmed she will start administering inhaler in this manner. Patient also does not rinse mouth out after using Symbicort inhaler. When she has thrush she brushes her tongue with baking soda and brushes so hard until she bleeds to resolve thrush. STRESSED NOT TO BRUSH MOUTH WITH BAKING SODA UNTIL HER MOUTH BLEEDS. Advised patient to contact office as she will need medication to treat thrush   since it is a fungal infection. Patient verbalized understanding and promised she will not treat thrush in this manner again. Provided patient with Symbicort administration patient education handout. 2. Spiriva Respimat - Patient does not exhale prior to using inhaler and is not waiting 30 sec to 1 min in between 2 puffs when administering 2 puffs twice daily. Patient confirmed she will start administering inhaler in this manner. Provided patient with Spiriva Respimat administration patient education handout. Patient PIFR was noted to be >90n. Upon re-training patient's PIFR was 30. Patient state she feels confident using Respimat device after appt. Advised patient to ask pharmacist to prime inhaler when obtaining Spiriva Respimat inhaler from pharmacy. 3. Pantoprazole - Advised patient to administer medication 30 min prior to other medications and food. Patient verbalized understanding.  Medication Reconciliation A drug regimen assessment was performed, including review of allergies, interactions, disease-state management, dosing and immunization history. All medications  are dosed appropriately according to renal function. Medications were reviewed with the patient, including name, instructions, indication, goals of therapy, potential side effects, importance of adherence, and safe use. 1. Carbamazepine - Patient states she is taking 200 mg twice daily (total = 400 mg daily) rather than 400 mg twice daily (total = 800 mg daily). Stressed the importance of following up with provider to discuss appropriate dose and dosing titration schedule if necessary. 2. Famotidine - Patient states she is taking 20 mg daily (total = 20 mg) rather than 20 mg twice daily (total = 40 mg). Discussed with patient this medication is used to assist with symptom management related to indigestion caused by GERD. If she feels 20 mg can manage symptoms then it is safe to continue this dose. Advised patient to follow up with provider to mention self dose change. 3. Irbesartan/HCTZ 150/12.5 mg daily and HCTZ 12.5 mg daily - Unfortunately irbesartan/HCTZ does not come in a 150/25 mg strength to decrease pill burden. It does not appear that equivalent doses of more potent ARBs (valsartan, olmesartan) come in a combo with a higher dose of HCTZ unless patient is titrated to a higher ARB dose. Advised patient to continue irbesartan/HCTZ 150/12.5 mg daily + HCTZ 12.5 mg daily.  4. Vitamin D and Xyzal - Defer to Dr. James John regarding refills and appropriate use.   Immunizations Patient has not received shingles vaccine. Was not able to address due to time constraints. Will address at follow up call.  This appointment required 75 minutes of patient care (this includes precharting, chart review, review of results, face-to-face care, etc.).  Thank you for involving pharmacy to assist in providing this patient's care.     , PharmD PGY2 Ambulatory Care Pharmacy Resident   

## 2020-03-10 NOTE — Patient Instructions (Signed)
1. Sebaceous cyst Incision and drainage done.  Postprocedure precautions reviewed.  Patient reassured.  Warm soaking recommended.  Clindamycin gel prescribed as needed.  2. Screen for STD (sexually transmitted disease) Continue with strict condom use.  Will complete testing with Pap test/high-risk HPV/gonorrhea and chlamydia at follow-up gynecologic exam. - HIV antibody (with reflex) - RPR - Hepatitis C Antibody - Hepatitis B Surface AntiGEN  Other orders - clindamycin (CLINDAGEL) 1 % gel; Apply topically daily as needed.  [name], it was a pleasure seeing you today!  I will inform you of your results as soon as they are available.

## 2020-03-10 NOTE — Progress Notes (Signed)
    [name] [birth year] [id number]        60 y.o.  [id number] Single  RP: Boil on vulva x 1 week  HPI: Tender boil on vulva x 1 week.  Tends to have 1-2 coming and going.  S/P total Hysterectomy.  Postmenopause on Estradiol tablet daily.  No pelvic pain.  Sexually active, using condoms.  Partner admitted having other partners.  Would like STI screen.   OB History  Gravida Para Term Preterm AB Living  7 3 3   4 3   SAB TAB Ectopic Multiple Live Births  3 1          # Outcome Date GA Lbr Len/2nd Weight Sex Delivery Anes PTL Lv  7 TAB           6 SAB           5 SAB           4 SAB           3 Term           2 Term           1 Term             Past medical history,surgical history, problem list, medications, allergies, family history and social history were all reviewed and documented in the EPIC chart.   Directed ROS with pertinent positives and negatives documented in the history of present illness/assessment and plan.  Exam:  Vitals:   03/10/20 1356  BP: 134/84   General appearance:  Normal  Gynecologic exam: Vulva:  Oval cystic lump 1 x 2 cm at left anterior vulva beside the clitoris.  Drained at patient's request.  Betadine prep, local anesthesia with Lido 1%.  I & D with scalpel.  Thick secretions expelled.  Good hemostasis.     Assessment/Plan:  59 y.o. [id number]   1. Sebaceous cyst Incision and drainage done.  Postprocedure precautions reviewed.  Patient reassured.  Warm soaking recommended.  Clindamycin gel prescribed as needed.  2. Screen for STD (sexually transmitted disease) Continue with strict condom use.  Will complete testing with Pap test/high-risk HPV/gonorrhea and chlamydia at follow-up gynecologic exam. - HIV antibody (with reflex) - RPR - Hepatitis C Antibody - Hepatitis B Surface AntiGEN  Other orders - clindamycin (CLINDAGEL) 1 % gel; Apply topically daily as needed.  Princess Bruins MD, 2:01 PM 03/10/2020

## 2020-03-11 LAB — HEPATITIS C ANTIBODY
Hepatitis C Ab: NONREACTIVE
SIGNAL TO CUT-OFF: 0.01 (ref <1.00)

## 2020-03-11 LAB — RPR: RPR Ser Ql: NONREACTIVE

## 2020-03-11 LAB — HIV ANTIBODY (ROUTINE TESTING W REFLEX): HIV 1&2 Ab, 4th Generation: NONREACTIVE

## 2020-03-11 LAB — HEPATITIS B SURFACE ANTIGEN: Hepatitis B Surface Ag: NONREACTIVE

## 2020-03-14 ENCOUNTER — Other Ambulatory Visit: Payer: Self-pay

## 2020-03-14 ENCOUNTER — Ambulatory Visit (INDEPENDENT_AMBULATORY_CARE_PROVIDER_SITE_OTHER): Payer: Medicare HMO | Admitting: Pharmacist

## 2020-03-14 DIAGNOSIS — Z79899 Other long term (current) drug therapy: Secondary | ICD-10-CM | POA: Diagnosis not present

## 2020-03-14 NOTE — Progress Notes (Signed)
Thank you for meeting with her

## 2020-03-14 NOTE — Patient Instructions (Addendum)
It was a pleasure seeing you in clinic today Ms. Achey!  Today the plan is... 1. Talk to behavioral health about carbamazepine dose - you are taking 1 tablets twice daily. It is written on bottle to take 2 tablets twice daily. You will want to talk to your doctor about dose and if you need to increase dose. 2. I will reach out to Dr. Cathlean Cower to talk about your irbesartan/HCTZ  and HCTZ to see if there is a combination pill to decrease your pill burden. You are currently taking HCTZ 25 mg (12.5 mg of irbesartan/HCTZ and 12.5 mg of HCTZ) which is a safe and normal dose. It just may be easier to take a combination pill instead. 3. You will want to talk to Dr. Cathlean Cower about vitamin D and Xyzal medications. 4. Remember to take Symbicort (red inhaler) 2 puffs twice daily. Remember to rinse your mouth out afterwards and DO NOT use baking soda! If you notice your tongue or back of your throat is white please call the office. 5. Remember to take Spiriva Respimat (grey inhaler) 2 puffs once daily. You do NOT need to rinse your mouth out afterwards. 6. If you are interested, I can send your prescriptions over to [facility]. This pharmacy can set you up with WEEKLY pill packs and send them directly to your house. [facility] located at [address], [place], [place] [address].  [facility] (pharmacist) will call you next Monday to double check if you are interested.  7. Remember for pantoprazole (yellow oval pill) - you must take this medication 30 minutes before breakfast for it to work  Please call the PharmD clinic at [phone] if you have any questions that you would like to speak with a pharmacist about Stanton [facility], Museum/gallery conservator).

## 2020-03-15 ENCOUNTER — Other Ambulatory Visit: Payer: Self-pay

## 2020-03-15 ENCOUNTER — Ambulatory Visit: Payer: Medicare HMO | Admitting: Physical Therapy

## 2020-03-15 ENCOUNTER — Encounter: Payer: Self-pay | Admitting: Physical Therapy

## 2020-03-15 DIAGNOSIS — M4712 Other spondylosis with myelopathy, cervical region: Secondary | ICD-10-CM

## 2020-03-15 DIAGNOSIS — M62838 Other muscle spasm: Secondary | ICD-10-CM

## 2020-03-15 DIAGNOSIS — M5412 Radiculopathy, cervical region: Secondary | ICD-10-CM

## 2020-03-15 NOTE — Therapy (Signed)
[place], [place], [address] Phone: [phone]   Fax:  [phone]  Physical Therapy Treatment  Patient Details  Name: [name] MRN: [id number] Date of Birth: 06-12-1961 Referring Provider (PT): Dr Frederich Cha   Encounter Date: 03/15/2020  PT End of Session - 03/15/20 0758    Visit Number  3    Number of Visits  12    Date for PT Re-Evaluation  04/11/20    Authorization Type  [employer]    PT Start Time  0800    PT Stop Time  0843    PT Time Calculation (min)  43 min    Activity Tolerance  Patient tolerated treatment well    Behavior During Therapy  [facility] for tasks assessed/performed       Past Medical History:  Diagnosis Date  . ALLERGIC RHINITIS 05/12/2009   Qualifier: Diagnosis of  By: Lenn Cal Deborra Medina), Wynona Canes    . Anemia, iron deficiency 12/22/2014  . Angina   . Anxiety   . Arthritis   . Asthma   . ASTHMA 05/12/2009   Severe AFL (Spirometry 05/2009: pre-BD FEV1 0.87L 34% pred, post-BD FEV1 1.11L 44% pred) Volumes hyperinflated Decreased DLCO that does not fully correct to normal range for alveolar volume.     Marland Kitchen COPD 08/24/2009   Qualifier: Diagnosis of  By: Burnett Kanaris    . COPD (chronic obstructive pulmonary disease) ([place])   . Depression   . Fibromyalgia 05/14/2014  . GERD (gastroesophageal reflux disease)   . Hyperlipidemia 04/20/2017  . HYPERTENSION 05/12/2009   Qualifier: Diagnosis of  By: Lenn Cal Deborra Medina), Wynona Canes    . Migraine   . Nephrolithiasis   . Peripheral vascular disease ([place])   . Prediabetes 02/23/2014  . Seizure ([place])   . Seizure ([place])   . Urticaria     Past Surgical History:  Procedure Laterality Date  . ABDOMINAL HYSTERECTOMY    . BACK SURGERY    . COLONOSCOPY  12/20/2011   Procedure: COLONOSCOPY;  Surgeon: Landry Dyke, MD;  Location: [facility];  Service: Endoscopy;  Laterality: N/A;  . COLONOSCOPY  03/05/2012   Procedure: COLONOSCOPY;  Surgeon:  Landry Dyke, MD;  Location: [facility];  Service: Endoscopy;  Laterality: N/A;  . DIAGNOSTIC LAPAROSCOPY    . HEMORRHOID SURGERY    . INCISE AND DRAIN ABCESS    . KIDNEY STONE SURGERY    . NECK SURGERY     x 2 Dr Orinda Kenner  . SPINE SURGERY  2019  . TOE SURGERY    . TUBAL LIGATION      There were no vitals filed for this visit.  Subjective Assessment - 03/15/20 0805    Subjective  Patient reported some pain over the weekend. She wasn't feeling well so they put her on prednisone. She layed around a lot this weekend and had some pain down her left arm. Her pain has resolved today. She is having soime chest pain but her B/P is good. She feels like it is her asthma.    Pertinent History  Lumbar spien surgery (unspeicifeid in the chart), Asthma, Fibromyalgia, Seizures,    Diagnostic tests  Had an MRI but not in the chart    Patient Stated Goals  to aviod surgery and pain mediciations    Currently in Pain?  Yes    Pain Score  4     Pain Location  Neck    Pain Orientation  Left  Pain Descriptors / Indicators  Aching    Pain Type  Chronic pain    Pain Onset  More than a month ago    Pain Frequency  Intermittent    Aggravating Factors   bending head foreward                       [facility] Adult PT Treatment/Exercise - 03/15/20 0001      Neck Exercises: Standing   Other Standing Exercises  standing shoulder extension 2x10 yellow; scap retraction x20 yellow       Neck Exercises: Seated   Other Seated Exercise  seated bilateral er 2x10 yellow       Manual Therapy   Soft tissue mobilization  sot tissue mobilization to upper traps and cervical parapsinals     Manual Traction  genlte manual traction to cervical spine      Neck Exercises: Stretches   Upper Trapezius Stretch  20 seconds;3 reps    Levator Stretch  3 reps;20 seconds    Corner Stretch  3 reps;20 seconds             PT Education - 03/15/20 0918    Education Details  reviewed HEP and symptom  management    Person(s) Educated  Patient    Methods  Explanation;Tactile cues;Verbal cues;Demonstration    Comprehension  Verbalized understanding;Verbal cues required;Returned demonstration;Tactile cues required       PT Short Term Goals - 03/15/20 0915      PT SHORT TERM GOAL #1   Title  Patient will increase cervical extension by 10 degrees    Time  3    Period  Weeks    Status  New    Target Date  03/21/20      PT SHORT TERM GOAL #2   Title  Patient will increase bilateral cervical rotation by 20 degrees bilateral    Time  3    Period  Weeks    Status  On-going    Target Date  03/21/20      PT SHORT TERM GOAL #3   Title  Patient will increase gross left grip strength to 20 lbs    Baseline  not tested    Time  3    Period  Weeks    Status  On-going    Target Date  03/21/20        PT Long Term Goals - 02/29/20 0946      PT LONG TERM GOAL #1   Title  Patient will report no radicular pain down her left arm whilre carrying grocery bags    Time  6    Period  Weeks    Status  New    Target Date  04/11/20      PT LONG TERM GOAL #2   Title  Patient will increase bilateral cervical rotation to 60 degrees in order to improve community safety    Time  6    Period  Weeks    Status  New    Target Date  04/11/20      PT LONG TERM GOAL #3   Title  Patient will increase pain free cervical flexion and extension by 20 degrees in order to use a computer without pain    Time  6    Period  Weeks    Status  New    Target Date  04/11/20            Plan - 03/15/20  0858    Clinical Impression Statement  Patient's spasming is decreasing in her upper trap but she still has a large spasm. Therapy added pec stretch and bilateral ER ahe hasd no increase in pain.    Personal Factors and Comorbidities  Comorbidity 1;Comorbidity 2;Comorbidity 3+    Comorbidities  asthma, lumbar fusion, fibromyalgia, depression    Examination-Activity Limitations  Bed Mobility;Locomotion  Level;Lift;Reach Overhead;Carry    Examination-Participation Restrictions  Laundry;Cleaning;Community Activity;Shop    Stability/Clinical Decision Making  Evolving/Moderate complexity    Clinical Decision Making  Moderate    Rehab Potential  Good    PT Frequency  2x / week    PT Duration  6 weeks    PT Treatment/Interventions  ADLs/Self Care Home Management;Electrical Stimulation;Iontophoresis 4mg /ml Dexamethasone;Moist Heat;Functional mobility training;Therapeutic activities;Therapeutic exercise;Neuromuscular re-education;Patient/family education;Manual techniques;Passive range of motion;Taping;Dry needling    PT Next Visit Plan  begin light stretching and scap retraction; begin light manual therapy. Patient had a minor increase in pain today with soft tissue mobilization to upper trap and peri-scpaular area. consider light manual traction    PT Home Exercise Plan  reviewed relaxation techniques, cervical rotation 3x in pain free range and sefl soft tissue mobilization to the upper trap    Consulted and Agree with Plan of Care  Patient       Patient will benefit from skilled therapeutic intervention in order to improve the following deficits and impairments:  Decreased range of motion, Increased fascial restricitons, Increased muscle spasms, Impaired UE functional use, Decreased activity tolerance, Pain, Decreased strength, Improper body mechanics, Postural dysfunction  Visit Diagnosis: Radiculopathy, cervical region  Other muscle spasm  Cervical spondylosis with myelopathy     Problem List Patient Active Problem List   Diagnosis Date Noted  . Leg cramping 08/12/2019  . Hypophosphatemia 01/07/2019  . Vertigo 09/23/2018  . Dysfunction of left eustachian tube 09/23/2018  . Gait disorder 04/21/2018  . Leukocytosis 04/15/2018  . Nausea & vomiting 04/15/2018  . Seizure ([place])   . Cervical radiculopathy 02/18/2018  . 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 Syncope 09/04/2017  .  Constipation 09/04/2017  . Nonintractable headache 09/04/2017  . Leg swelling 07/26/2017  . 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 Hyperlipidemia 04/20/2017  . Pedal edema 04/19/2017  . Bipolar I disorder ([place]) 09/19/2016  . Facial pain 07/12/2016  . Rash 06/14/2016  . Migraine 01/25/2016  . Idiopathic urticaria/pruritus 01/23/2016  . Perennial allergic rhinitis 01/23/2016  . Oral candidiasis 01/23/2016  . Greater trochanteric bursitis of both hips 09/08/2015  . Chest pain 06/22/2015  . Itching 06/22/2015  . Bilateral knee pain 06/22/2015  . Bilateral hip pain 06/22/2015  . Anemia, iron deficiency 12/22/2014  . Fatigue 12/21/2014  . Intertrigo 08/07/2014  . Dyspareunia 07/12/2014  . Menopausal syndrome (hot flushes) 07/12/2014  . History of tobacco abuse 07/12/2014  . Dizziness 06/22/2014  . Weakness 06/22/2014  . Fibromyalgia 05/14/2014  . Preventative health care 04/22/2014  . Recurrent boils 04/22/2014  . Recurrent falls 04/22/2014  . Peripheral vascular disease ([place])   . Depression   . Anxiety   . GERD (gastroesophageal reflux disease)   . Prediabetes 02/23/2014  . Screening mammogram for high-risk patient 02/23/2014  . Back pain 07/22/2013  . COPD/asthma 08/24/2009  . Headache(784.0) 08/24/2009  . HTN (hypertension) 05/12/2009    Carney Living PT DPT  03/15/2020, 9:20 AM  [facility] [address] [place], [place], [address] Phone: [phone]   Fax:  [phone]  Name:  [name] MRN: [id number] Date of Birth: 08/15/1961

## 2020-03-17 ENCOUNTER — Ambulatory Visit: Payer: Medicare HMO | Admitting: Physical Therapy

## 2020-03-21 ENCOUNTER — Other Ambulatory Visit: Payer: Self-pay

## 2020-03-21 ENCOUNTER — Ambulatory Visit: Payer: Medicare HMO | Attending: Physical Medicine and Rehabilitation | Admitting: Physical Therapy

## 2020-03-21 ENCOUNTER — Telehealth: Payer: Self-pay | Admitting: Pharmacist

## 2020-03-21 ENCOUNTER — Encounter: Payer: Self-pay | Admitting: Physical Therapy

## 2020-03-21 DIAGNOSIS — M4712 Other spondylosis with myelopathy, cervical region: Secondary | ICD-10-CM | POA: Diagnosis not present

## 2020-03-21 DIAGNOSIS — M62838 Other muscle spasm: Secondary | ICD-10-CM | POA: Insufficient documentation

## 2020-03-21 DIAGNOSIS — M5412 Radiculopathy, cervical region: Secondary | ICD-10-CM | POA: Diagnosis not present

## 2020-03-21 NOTE — Therapy (Signed)
[place], [place], [address] Phone: [phone]   Fax:  [phone]  Physical Therapy Treatment  Patient Details  Name: [name] MRN: [id number] Date of Birth: 01-23-1961 Referring Provider (PT): Dr Frederich Cha   Encounter Date: 03/21/2020  PT End of Session - 03/21/20 0850    Visit Number  4    Number of Visits  12    Date for PT Re-Evaluation  04/11/20    Authorization Type  [employer]    PT Start Time  0845    PT Stop Time  0926    PT Time Calculation (min)  41 min    Activity Tolerance  Patient tolerated treatment well    Behavior During Therapy  [facility] for tasks assessed/performed       Past Medical History:  Diagnosis Date  . ALLERGIC RHINITIS 05/12/2009   Qualifier: Diagnosis of  By: Lenn Cal Deborra Medina), Wynona Canes    . Anemia, iron deficiency 12/22/2014  . Angina   . Anxiety   . Arthritis   . Asthma   . ASTHMA 05/12/2009   Severe AFL (Spirometry 05/2009: pre-BD FEV1 0.87L 34% pred, post-BD FEV1 1.11L 44% pred) Volumes hyperinflated Decreased DLCO that does not fully correct to normal range for alveolar volume.     Marland Kitchen COPD 08/24/2009   Qualifier: Diagnosis of  By: Burnett Kanaris    . COPD (chronic obstructive pulmonary disease) ([place])   . Depression   . Fibromyalgia 05/14/2014  . GERD (gastroesophageal reflux disease)   . Hyperlipidemia 04/20/2017  . HYPERTENSION 05/12/2009   Qualifier: Diagnosis of  By: Lenn Cal Deborra Medina), Wynona Canes    . Migraine   . Nephrolithiasis   . Peripheral vascular disease ([place])   . Prediabetes 02/23/2014  . Seizure ([place])   . Seizure ([place])   . Urticaria     Past Surgical History:  Procedure Laterality Date  . ABDOMINAL HYSTERECTOMY    . BACK SURGERY    . COLONOSCOPY  12/20/2011   Procedure: COLONOSCOPY;  Surgeon: Landry Dyke, MD;  Location: [facility];  Service: Endoscopy;  Laterality: N/A;  . COLONOSCOPY  03/05/2012   Procedure: COLONOSCOPY;  Surgeon: Landry Dyke, MD;  Location: [facility];  Service: Endoscopy;  Laterality: N/A;  . DIAGNOSTIC LAPAROSCOPY    . HEMORRHOID SURGERY    . INCISE AND DRAIN ABCESS    . KIDNEY STONE SURGERY    . NECK SURGERY     x 2 Dr Orinda Kenner  . SPINE SURGERY  2019  . TOE SURGERY    . TUBAL LIGATION      There were no vitals filed for this visit.  Subjective Assessment - 03/21/20 0849    Subjective  The patient was sore after her covid shit but she reports this morning she dosent have much pain.    Pertinent History  Lumbar spien surgery (unspeicifeid in the chart), Asthma, Fibromyalgia, Seizures,    Diagnostic tests  Had an MRI but not in the chart    Patient Stated Goals  to aviod surgery and pain mediciations    Currently in Pain?  No/denies                       [facility] Adult PT Treatment/Exercise - 03/21/20 0001      Neck Exercises: Standing   Other Standing Exercises  standing shoulder extension 2x10 red; scap retraction x20 red      Neck Exercises: Seated  Other Seated Exercise  seated bilateral er 2x10 yellow     Other Seated Exercise  horizontal abduction yellow x10       Neck Exercises: Supine   Other Supine Exercise  supine wand flexion x10       Manual Therapy   Soft tissue mobilization  sot tissue mobilization to upper traps and cervical parapsinals     Manual Traction  genlte manual traction to cervical spine             PT Education - 03/21/20 0849    Person(s) Educated  Patient    Methods  Explanation    Comprehension  Verbalized understanding;Returned demonstration;Verbal cues required;Tactile cues required       PT Short Term Goals - 03/15/20 0915      PT SHORT TERM GOAL #1   Title  Patient will increase cervical extension by 10 degrees    Time  3    Period  Weeks    Status  New    Target Date  03/21/20      PT SHORT TERM GOAL #2   Title  Patient will increase bilateral cervical rotation by 20 degrees bilateral    Time  3    Period  Weeks     Status  On-going    Target Date  03/21/20      PT SHORT TERM GOAL #3   Title  Patient will increase gross left grip strength to 20 lbs    Baseline  not tested    Time  3    Period  Weeks    Status  On-going    Target Date  03/21/20        PT Long Term Goals - 02/29/20 0946      PT LONG TERM GOAL #1   Title  Patient will report no radicular pain down her left arm whilre carrying grocery bags    Time  6    Period  Weeks    Status  New    Target Date  04/11/20      PT LONG TERM GOAL #2   Title  Patient will increase bilateral cervical rotation to 60 degrees in order to improve community safety    Time  6    Period  Weeks    Status  New    Target Date  04/11/20      PT LONG TERM GOAL #3   Title  Patient will increase pain free cervical flexion and extension by 20 degrees in order to use a computer without pain    Time  6    Period  Weeks    Status  New    Target Date  04/11/20            Plan - 03/21/20 0902    Clinical Impression Statement  Patient continues to have spasming in her upper trap and peri-scpaular area but her tolerance to manual therapy is improving. Therapy added in seated horizontal abdcution; and supine wand flexion. her bands werew also advanced. Therapy continues to work on relaxation techniques for the upper trap. We will continue to progress ther-ex as tolerated.    Personal Factors and Comorbidities  Comorbidity 1;Comorbidity 2;Comorbidity 3+    Comorbidities  asthma, lumbar fusion, fibromyalgia, depression    Examination-Activity Limitations  Bed Mobility;Locomotion Level;Lift;Reach Overhead;Carry    Stability/Clinical Decision Making  Evolving/Moderate complexity    Clinical Decision Making  Moderate    Rehab Potential  Good    PT  Frequency  2x / week    PT Duration  6 weeks    PT Treatment/Interventions  ADLs/Self Care Home Management;Electrical Stimulation;Iontophoresis 4mg /ml Dexamethasone;Moist Heat;Functional mobility  training;Therapeutic activities;Therapeutic exercise;Neuromuscular re-education;Patient/family education;Manual techniques;Passive range of motion;Taping;Dry needling    PT Next Visit Plan  begin light stretching and scap retraction; begin light manual therapy. Patient had a minor increase in pain today with soft tissue mobilization to upper trap and peri-scpaular area. consider light manual traction    PT Home Exercise Plan  reviewed relaxation techniques, cervical rotation 3x in pain free range and sefl soft tissue mobilization to the upper trap    Consulted and Agree with Plan of Care  Patient       Patient will benefit from skilled therapeutic intervention in order to improve the following deficits and impairments:  Decreased range of motion, Increased fascial restricitons, Increased muscle spasms, Impaired UE functional use, Decreased activity tolerance, Pain, Decreased strength, Improper body mechanics, Postural dysfunction  Visit Diagnosis: Radiculopathy, cervical region  Other muscle spasm  Cervical spondylosis with myelopathy     Problem List Patient Active Problem List   Diagnosis Date Noted  . Leg cramping 08/12/2019  . Hypophosphatemia 01/07/2019  . Vertigo 09/23/2018  . Dysfunction of left eustachian tube 09/23/2018  . Gait disorder 04/21/2018  . Leukocytosis 04/15/2018  . Nausea & vomiting 04/15/2018  . Seizure ([place])   . Cervical radiculopathy 02/18/2018  . 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 Syncope 09/04/2017  . Constipation 09/04/2017  . Nonintractable headache 09/04/2017  . Leg swelling 07/26/2017  . 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 Hyperlipidemia 04/20/2017  . Pedal edema 04/19/2017  . Bipolar I disorder ([place]) 09/19/2016  . Facial pain 07/12/2016  . Rash 06/14/2016  . Migraine 01/25/2016  . Idiopathic urticaria/pruritus 01/23/2016  . Perennial allergic rhinitis 01/23/2016  . Oral candidiasis 01/23/2016  . Greater trochanteric bursitis of  both hips 09/08/2015  . Chest pain 06/22/2015  . Itching 06/22/2015  . Bilateral knee pain 06/22/2015  . Bilateral hip pain 06/22/2015  . Anemia, iron deficiency 12/22/2014  . Fatigue 12/21/2014  . Intertrigo 08/07/2014  . Dyspareunia 07/12/2014  . Menopausal syndrome (hot flushes) 07/12/2014  . History of tobacco abuse 07/12/2014  . Dizziness 06/22/2014  . Weakness 06/22/2014  . Fibromyalgia 05/14/2014  . Preventative health care 04/22/2014  . Recurrent boils 04/22/2014  . Recurrent falls 04/22/2014  . Peripheral vascular disease ([place])   . Depression   . Anxiety   . GERD (gastroesophageal reflux disease)   . Prediabetes 02/23/2014  . Screening mammogram for high-risk patient 02/23/2014  . Back pain 07/22/2013  . COPD/asthma 08/24/2009  . Headache(784.0) 08/24/2009  . HTN (hypertension) 05/12/2009    Carney Living PT DPT  03/21/2020, 1:09 PM  [facility] [address] [place], [place], [address] Phone: [phone]   Fax:  [phone]  Name: [name] MRN: [id number] Date of Birth: [birth year]

## 2020-03-21 NOTE — Telephone Encounter (Signed)
Called patient on 03/21/2020 at 3:00 PM and left HIPAA-compliant VM with instructions to call [facility] clinic back to discuss if she is interested in having prescriptions sent to [place] considering this pharmacy has a service offered where pharmacy team can send pateint weekly blister packs via mail without additional charges.   Unsuccessful attempt #1  Thank you for involving pharmacy to assist in providing this patient's care.   Drexel Iha, PharmD PGY2 Ambulatory Care Pharmacy Resident

## 2020-03-23 ENCOUNTER — Encounter: Payer: Self-pay | Admitting: Physical Therapy

## 2020-03-23 ENCOUNTER — Other Ambulatory Visit: Payer: Self-pay

## 2020-03-23 ENCOUNTER — Ambulatory Visit: Payer: Medicare HMO | Admitting: Physical Therapy

## 2020-03-23 DIAGNOSIS — M62838 Other muscle spasm: Secondary | ICD-10-CM

## 2020-03-23 DIAGNOSIS — M5412 Radiculopathy, cervical region: Secondary | ICD-10-CM

## 2020-03-23 DIAGNOSIS — M4712 Other spondylosis with myelopathy, cervical region: Secondary | ICD-10-CM | POA: Diagnosis not present

## 2020-03-23 NOTE — Therapy (Signed)
[place], [place], [address] Phone: [phone]   Fax:  [phone]  Physical Therapy Treatment  Patient Details  Name: [name] MRN: [id number] Date of Birth: 1961-04-26 Referring Provider (PT): Dr Frederich Cha   Encounter Date: 03/23/2020  PT End of Session - 03/23/20 1250    Visit Number  5    Number of Visits  12    Date for PT Re-Evaluation  04/11/20    Authorization Type  [employer]    PT Start Time  0845    PT Stop Time  0928    PT Time Calculation (min)  43 min    Activity Tolerance  Patient tolerated treatment well    Behavior During Therapy  [facility] for tasks assessed/performed       Past Medical History:  Diagnosis Date  . ALLERGIC RHINITIS 05/12/2009   Qualifier: Diagnosis of  By: Lenn Cal Deborra Medina), Wynona Canes    . Anemia, iron deficiency 12/22/2014  . Angina   . Anxiety   . Arthritis   . Asthma   . ASTHMA 05/12/2009   Severe AFL (Spirometry 05/2009: pre-BD FEV1 0.87L 34% pred, post-BD FEV1 1.11L 44% pred) Volumes hyperinflated Decreased DLCO that does not fully correct to normal range for alveolar volume.     Marland Kitchen COPD 08/24/2009   Qualifier: Diagnosis of  By: Burnett Kanaris    . COPD (chronic obstructive pulmonary disease) ([place])   . Depression   . Fibromyalgia 05/14/2014  . GERD (gastroesophageal reflux disease)   . Hyperlipidemia 04/20/2017  . HYPERTENSION 05/12/2009   Qualifier: Diagnosis of  By: Lenn Cal Deborra Medina), Wynona Canes    . Migraine   . Nephrolithiasis   . Peripheral vascular disease ([place])   . Prediabetes 02/23/2014  . Seizure ([place])   . Seizure ([place])   . Urticaria     Past Surgical History:  Procedure Laterality Date  . ABDOMINAL HYSTERECTOMY    . BACK SURGERY    . COLONOSCOPY  12/20/2011   Procedure: COLONOSCOPY;  Surgeon: Landry Dyke, MD;  Location: [facility];  Service: Endoscopy;  Laterality: N/A;  . COLONOSCOPY  03/05/2012   Procedure: COLONOSCOPY;  Surgeon: Landry Dyke, MD;  Location: [facility];  Service: Endoscopy;  Laterality: N/A;  . DIAGNOSTIC LAPAROSCOPY    . HEMORRHOID SURGERY    . INCISE AND DRAIN ABCESS    . KIDNEY STONE SURGERY    . NECK SURGERY     x 2 Dr Orinda Kenner  . SPINE SURGERY  2019  . TOE SURGERY    . TUBAL LIGATION      There were no vitals filed for this visit.  Subjective Assessment - 03/23/20 0903    Subjective  Patient reports her neck and shoulder have improved. She continues to have mild pain and spasming in her upper trap. her feet hurt today. She has a history of gout. She is going to call her foot doctor.    Pertinent History  Lumbar spien surgery (unspeicifeid in the chart), Asthma, Fibromyalgia, Seizures,    Diagnostic tests  Had an MRI but not in the chart    Patient Stated Goals  to aviod surgery and pain mediciations    Currently in Pain?  Yes    Pain Score  3     Pain Location  Neck    Pain Orientation  Left    Pain Descriptors / Indicators  Aching    Pain Type  Chronic pain  Pain Onset  More than a month ago    Pain Frequency  Intermittent    Aggravating Factors   bending the head forward    Pain Relieving Factors  rest         [facility] PT Assessment - 03/23/20 0001      AROM   Cervical Flexion  25    Cervical Extension  20    Cervical - Right Rotation  50    Cervical - Left Rotation  48      Strength   Right Shoulder Flexion  5/5    Left Shoulder Flexion  4+/5    Left Shoulder Internal Rotation  4+/5                   [facility] Adult PT Treatment/Exercise - 03/23/20 0001      Neck Exercises: Standing   Other Standing Exercises  standing shoulder extension 2x10 red; scap retraction x20 red      Neck Exercises: Seated   Other Seated Exercise  seated bilateral er 2x10 yellow     Other Seated Exercise  horizontal abduction yellow x10       Manual Therapy   Soft tissue mobilization  sot tissue mobilization to upper traps and cervical parapsinals ; IASTYM to uppwe trap      Manual Traction  genlte manual traction to cervical spine      Neck Exercises: Stretches   Upper Trapezius Stretch  20 seconds;3 reps    Levator Stretch  3 reps;20 seconds             PT Education - 03/23/20 0909    Education Details  reviewed HEp and symptom mangement    Person(s) Educated  Patient    Methods  Explanation;Demonstration;Tactile cues;Verbal cues    Comprehension  Verbalized understanding;Returned demonstration;Verbal cues required;Tactile cues required       PT Short Term Goals - 03/23/20 1300      PT SHORT TERM GOAL #1   Title  Patient will increase cervical extension by 10 degrees    Time  3    Period  Weeks    Status  New    Target Date  03/21/20      PT SHORT TERM GOAL #2   Title  Patient will increase bilateral cervical rotation by 20 degrees bilateral    Time  3    Period  Weeks    Status  On-going    Target Date  03/21/20      PT SHORT TERM GOAL #3   Title  Patient will increase gross left grip strength to 20 lbs    Baseline  still limited    Time  3    Period  Weeks        PT Long Term Goals - 02/29/20 0946      PT LONG TERM GOAL #1   Title  Patient will report no radicular pain down her left arm whilre carrying grocery bags    Time  6    Period  Weeks    Status  New    Target Date  04/11/20      PT LONG TERM GOAL #2   Title  Patient will increase bilateral cervical rotation to 60 degrees in order to improve community safety    Time  6    Period  Weeks    Status  New    Target Date  04/11/20      PT LONG TERM GOAL #3  Title  Patient will increase pain free cervical flexion and extension by 20 degrees in order to use a computer without pain    Time  6    Period  Weeks    Status  New    Target Date  04/11/20            Plan - 03/23/20 1252    Clinical Impression Statement  Therapy assessed patients movement today. She has improved left shoulder motion and improved cervical motion. She still has limitations. She has  alarge spasm still in her left upper trap but it has improved. Patient given red band for home.    Personal Factors and Comorbidities  Comorbidity 1;Comorbidity 2;Comorbidity 3+    Comorbidities  asthma, lumbar fusion, fibromyalgia, depression    Examination-Activity Limitations  Bed Mobility;Locomotion Level;Lift;Reach Overhead;Carry    Examination-Participation Restrictions  Laundry;Cleaning;Community Activity;Shop    Stability/Clinical Decision Making  Evolving/Moderate complexity    Clinical Decision Making  Moderate    Rehab Potential  Good    PT Frequency  2x / week    PT Duration  6 weeks    PT Treatment/Interventions  ADLs/Self Care Home Management;Electrical Stimulation;Iontophoresis 4mg /ml Dexamethasone;Moist Heat;Functional mobility training;Therapeutic activities;Therapeutic exercise;Neuromuscular re-education;Patient/family education;Manual techniques;Passive range of motion;Taping;Dry needling    PT Next Visit Plan  begin light stretching and scap retraction; begin light manual therapy. Patient had a minor increase in pain today with soft tissue mobilization to upper trap and peri-scpaular area. consider light manual traction    PT Home Exercise Plan  reviewed relaxation techniques, cervical rotation 3x in pain free range and sefl soft tissue mobilization to the upper trap    Consulted and Agree with Plan of Care  Patient       Patient will benefit from skilled therapeutic intervention in order to improve the following deficits and impairments:  Decreased range of motion, Increased fascial restricitons, Increased muscle spasms, Impaired UE functional use, Decreased activity tolerance, Pain, Decreased strength, Improper body mechanics, Postural dysfunction  Visit Diagnosis: Radiculopathy, cervical region  Other muscle spasm  Cervical spondylosis with myelopathy     Problem List Patient Active Problem List   Diagnosis Date Noted  . Leg cramping 08/12/2019  .  Hypophosphatemia 01/07/2019  . Vertigo 09/23/2018  . Dysfunction of left eustachian tube 09/23/2018  . Gait disorder 04/21/2018  . Leukocytosis 04/15/2018  . Nausea & vomiting 04/15/2018  . Seizure ([place])   . Cervical radiculopathy 02/18/2018  . 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 Syncope 09/04/2017  . Constipation 09/04/2017  . Nonintractable headache 09/04/2017  . Leg swelling 07/26/2017  . 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 Hyperlipidemia 04/20/2017  . Pedal edema 04/19/2017  . Bipolar I disorder ([place]) 09/19/2016  . Facial pain 07/12/2016  . Rash 06/14/2016  . Migraine 01/25/2016  . Idiopathic urticaria/pruritus 01/23/2016  . Perennial allergic rhinitis 01/23/2016  . Oral candidiasis 01/23/2016  . Greater trochanteric bursitis of both hips 09/08/2015  . Chest pain 06/22/2015  . Itching 06/22/2015  . Bilateral knee pain 06/22/2015  . Bilateral hip pain 06/22/2015  . Anemia, iron deficiency 12/22/2014  . Fatigue 12/21/2014  . Intertrigo 08/07/2014  . Dyspareunia 07/12/2014  . Menopausal syndrome (hot flushes) 07/12/2014  . History of tobacco abuse 07/12/2014  . Dizziness 06/22/2014  . Weakness 06/22/2014  . Fibromyalgia 05/14/2014  . Preventative health care 04/22/2014  . Recurrent boils 04/22/2014  . Recurrent falls 04/22/2014  . Peripheral vascular disease ([place])   . Depression   . Anxiety   .  GERD (gastroesophageal reflux disease)   . Prediabetes 02/23/2014  . Screening mammogram for high-risk patient 02/23/2014  . Back pain 07/22/2013  . COPD/asthma 08/24/2009  . Headache(784.0) 08/24/2009  . HTN (hypertension) 05/12/2009    Carney Living 03/23/2020, 1:03 PM  [facility] [address] [place], [place], [address] Phone: [phone]   Fax:  [phone]  Name: [name] MRN: [id number] Date of Birth: 02-07-1961

## 2020-03-25 ENCOUNTER — Other Ambulatory Visit: Payer: Self-pay

## 2020-03-25 ENCOUNTER — Ambulatory Visit (INDEPENDENT_AMBULATORY_CARE_PROVIDER_SITE_OTHER): Payer: Medicare HMO

## 2020-03-25 DIAGNOSIS — J4540 Moderate persistent asthma, uncomplicated: Secondary | ICD-10-CM

## 2020-03-28 ENCOUNTER — Other Ambulatory Visit: Payer: Self-pay

## 2020-03-28 ENCOUNTER — Encounter: Payer: Self-pay | Admitting: Physical Therapy

## 2020-03-28 ENCOUNTER — Ambulatory Visit: Payer: Medicare HMO | Admitting: Physical Therapy

## 2020-03-28 ENCOUNTER — Telehealth: Payer: Self-pay | Admitting: Pharmacist

## 2020-03-28 DIAGNOSIS — M4712 Other spondylosis with myelopathy, cervical region: Secondary | ICD-10-CM | POA: Diagnosis not present

## 2020-03-28 DIAGNOSIS — M62838 Other muscle spasm: Secondary | ICD-10-CM

## 2020-03-28 DIAGNOSIS — M5412 Radiculopathy, cervical region: Secondary | ICD-10-CM

## 2020-03-28 NOTE — Telephone Encounter (Signed)
Called patient on 03/28/2020 at 4:49 PM   Patient is not interested in pill packing service offered at [facility] at this time. She states she confirms her daughter will help her fill out pill box each week. She does not have any other medication questions or concerns at this time. Advised patient to contact clinical pharmacy team for further medication concerns. Patient verbalizes understanding.  Thank you for involving pharmacy to assist in providing this patient's care.   Drexel Iha, PharmD PGY2 Ambulatory Care Pharmacy Resident

## 2020-03-28 NOTE — Therapy (Signed)
[place], [place], [address] Phone: [phone]   Fax:  [phone]  Physical Therapy Treatment  Patient Details  Name: [name] MRN: [id number] Date of Birth: 10-01-1961 Referring Provider (PT): Dr Frederich Cha   Encounter Date: 03/28/2020  PT End of Session - 03/28/20 0909    Visit Number  6    Number of Visits  12    Date for PT Re-Evaluation  04/11/20    Authorization Type  [employer]    PT Start Time  [phone]   therapist late   PT Stop Time  0930    PT Time Calculation (min)  40 min    Activity Tolerance  Patient tolerated treatment well    Behavior During Therapy  [facility] for tasks assessed/performed       Past Medical History:  Diagnosis Date  . ALLERGIC RHINITIS 05/12/2009   Qualifier: Diagnosis of  By: Lenn Cal Deborra Medina), Wynona Canes    . Anemia, iron deficiency 12/22/2014  . Angina   . Anxiety   . Arthritis   . Asthma   . ASTHMA 05/12/2009   Severe AFL (Spirometry 05/2009: pre-BD FEV1 0.87L 34% pred, post-BD FEV1 1.11L 44% pred) Volumes hyperinflated Decreased DLCO that does not fully correct to normal range for alveolar volume.     Marland Kitchen COPD 08/24/2009   Qualifier: Diagnosis of  By: Burnett Kanaris    . COPD (chronic obstructive pulmonary disease) ([place])   . Depression   . Fibromyalgia 05/14/2014  . GERD (gastroesophageal reflux disease)   . Hyperlipidemia 04/20/2017  . HYPERTENSION 05/12/2009   Qualifier: Diagnosis of  By: Lenn Cal Deborra Medina), Wynona Canes    . Migraine   . Nephrolithiasis   . Peripheral vascular disease ([place])   . Prediabetes 02/23/2014  . Seizure ([place])   . Seizure ([place])   . Urticaria     Past Surgical History:  Procedure Laterality Date  . ABDOMINAL HYSTERECTOMY    . BACK SURGERY    . COLONOSCOPY  12/20/2011   Procedure: COLONOSCOPY;  Surgeon: Landry Dyke, MD;  Location: [facility];  Service: Endoscopy;  Laterality: N/A;  . COLONOSCOPY  03/05/2012   Procedure: COLONOSCOPY;   Surgeon: Landry Dyke, MD;  Location: [facility];  Service: Endoscopy;  Laterality: N/A;  . DIAGNOSTIC LAPAROSCOPY    . HEMORRHOID SURGERY    . INCISE AND DRAIN ABCESS    . KIDNEY STONE SURGERY    . NECK SURGERY     x 2 Dr Orinda Kenner  . SPINE SURGERY  2019  . TOE SURGERY    . TUBAL LIGATION      There were no vitals filed for this visit.  Subjective Assessment - 03/28/20 0907    Subjective  Patient had mild symptoms on Friday after cleaning a bath tub but took a muscle relaxer and was able to sleep. She is not having pain this morning.    Pertinent History  Lumbar spien surgery (unspeicifeid in the chart), Asthma, Fibromyalgia, Seizures,    Diagnostic tests  Had an MRI but not in the chart    Patient Stated Goals  to aviod surgery and pain mediciations    Currently in Pain?  No/denies                       [facility] Adult PT Treatment/Exercise - 03/28/20 0001      Neck Exercises: Standing   Other Standing Exercises  standing shoulder extension 2x10 green; scap  retraction x20 green      Neck Exercises: Seated   Other Seated Exercise  seated bilateral er 2x10 red    Other Seated Exercise  horizontal abduction red x10       Manual Therapy   Soft tissue mobilization  sot tissue mobilization to upper traps and cervical parapsinals ;    Manual Traction  genlte manual traction to cervical spine      Neck Exercises: Stretches   Upper Trapezius Stretch  20 seconds;3 reps    Levator Stretch  3 reps;20 seconds             PT Education - 03/28/20 0908    Education Details  reviwed tecnique with stretching    Person(s) Educated  Patient    Methods  Explanation;Demonstration;Tactile cues;Verbal cues    Comprehension  Verbalized understanding;Returned demonstration;Verbal cues required;Tactile cues required       PT Short Term Goals - 03/28/20 1649      PT SHORT TERM GOAL #1   Title  Patient will increase cervical extension by 10 degrees    Time  3     Period  Weeks    Status  On-going    Target Date  03/21/20      PT SHORT TERM GOAL #2   Title  Patient will increase bilateral cervical rotation by 20 degrees bilateral    Time  3    Period  Weeks    Status  On-going    Target Date  03/21/20      PT SHORT TERM GOAL #3   Title  Patient will increase gross left grip strength to 20 lbs    Baseline  still limited    Time  3    Period  Weeks    Status  On-going        PT Long Term Goals - 02/29/20 0946      PT LONG TERM GOAL #1   Title  Patient will report no radicular pain down her left arm whilre carrying grocery bags    Time  6    Period  Weeks    Status  New    Target Date  04/11/20      PT LONG TERM GOAL #2   Title  Patient will increase bilateral cervical rotation to 60 degrees in order to improve community safety    Time  6    Period  Weeks    Status  New    Target Date  04/11/20      PT LONG TERM GOAL #3   Title  Patient will increase pain free cervical flexion and extension by 20 degrees in order to use a computer without pain    Time  6    Period  Weeks    Status  New    Target Date  04/11/20            Plan - 03/28/20 0912    Clinical Impression Statement  Therapy was able to advance patients band to green for standing activity. She had no increase in pain. overall she is making great progress. She has less spasming alothough she does still have some sensatvie trigger points. Therapy reviewed her strethcing exercises for home if she gets flaired up.    Personal Factors and Comorbidities  Comorbidity 1;Comorbidity 2;Comorbidity 3+    Comorbidities  asthma, lumbar fusion, fibromyalgia, depression    Examination-Activity Limitations  Bed Mobility;Locomotion Level;Lift;Reach Overhead;Carry    Examination-Participation Restrictions  Laundry;Cleaning;Community Activity;Shop  Stability/Clinical Decision Making  Evolving/Moderate complexity    Clinical Decision Making  Moderate    Rehab Potential  Good     PT Frequency  2x / week    PT Duration  6 weeks    PT Treatment/Interventions  ADLs/Self Care Home Management;Electrical Stimulation;Iontophoresis 4mg /ml Dexamethasone;Moist Heat;Functional mobility training;Therapeutic activities;Therapeutic exercise;Neuromuscular re-education;Patient/family education;Manual techniques;Passive range of motion;Taping;Dry needling    PT Next Visit Plan  begin light stretching and scap retraction; begin light manual therapy. Patient had a minor increase in pain today with soft tissue mobilization to upper trap and peri-scpaular area. consider light manual traction    PT Home Exercise Plan  reviewed relaxation techniques, cervical rotation 3x in pain free range and sefl soft tissue mobilization to the upper trap    Consulted and Agree with Plan of Care  Patient       Patient will benefit from skilled therapeutic intervention in order to improve the following deficits and impairments:  Decreased range of motion, Increased fascial restricitons, Increased muscle spasms, Impaired UE functional use, Decreased activity tolerance, Pain, Decreased strength, Improper body mechanics, Postural dysfunction  Visit Diagnosis: Radiculopathy, cervical region  Other muscle spasm  Cervical spondylosis with myelopathy     Problem List Patient Active Problem List   Diagnosis Date Noted  . Leg cramping 08/12/2019  . Hypophosphatemia 01/07/2019  . Vertigo 09/23/2018  . Dysfunction of left eustachian tube 09/23/2018  . Gait disorder 04/21/2018  . Leukocytosis 04/15/2018  . Nausea & vomiting 04/15/2018  . Seizure ([place])   . Cervical radiculopathy 02/18/2018  . 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 Syncope 09/04/2017  . Constipation 09/04/2017  . Nonintractable headache 09/04/2017  . Leg swelling 07/26/2017  . 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 Hyperlipidemia 04/20/2017  . Pedal edema 04/19/2017  . Bipolar I disorder ([place]) 09/19/2016  . Facial pain 07/12/2016  .  Rash 06/14/2016  . Migraine 01/25/2016  . Idiopathic urticaria/pruritus 01/23/2016  . Perennial allergic rhinitis 01/23/2016  . Oral candidiasis 01/23/2016  . Greater trochanteric bursitis of both hips 09/08/2015  . Chest pain 06/22/2015  . Itching 06/22/2015  . Bilateral knee pain 06/22/2015  . Bilateral hip pain 06/22/2015  . Anemia, iron deficiency 12/22/2014  . Fatigue 12/21/2014  . Intertrigo 08/07/2014  . Dyspareunia 07/12/2014  . Menopausal syndrome (hot flushes) 07/12/2014  . History of tobacco abuse 07/12/2014  . Dizziness 06/22/2014  . Weakness 06/22/2014  . Fibromyalgia 05/14/2014  . Preventative health care 04/22/2014  . Recurrent boils 04/22/2014  . Recurrent falls 04/22/2014  . Peripheral vascular disease ([place])   . Depression   . Anxiety   . GERD (gastroesophageal reflux disease)   . Prediabetes 02/23/2014  . Screening mammogram for high-risk patient 02/23/2014  . Back pain 07/22/2013  . COPD/asthma 08/24/2009  . Headache(784.0) 08/24/2009  . HTN (hypertension) 05/12/2009    Carney Living  PT DPT  03/28/2020, 4:50 PM  [facility] [address] [place], [place], [address] Phone: [phone]   Fax:  [phone]  Name: [name] MRN: [id number] Date of Birth: [birth year]

## 2020-03-30 ENCOUNTER — Telehealth: Payer: Self-pay | Admitting: Emergency Medicine

## 2020-03-30 ENCOUNTER — Ambulatory Visit: Payer: Medicare HMO | Admitting: Physical Therapy

## 2020-03-30 ENCOUNTER — Ambulatory Visit (INDEPENDENT_AMBULATORY_CARE_PROVIDER_SITE_OTHER): Payer: Medicare HMO | Admitting: Primary Care

## 2020-03-30 ENCOUNTER — Other Ambulatory Visit: Payer: Self-pay

## 2020-03-30 ENCOUNTER — Encounter: Payer: Self-pay | Admitting: Physical Therapy

## 2020-03-30 DIAGNOSIS — Z7951 Long term (current) use of inhaled steroids: Secondary | ICD-10-CM | POA: Diagnosis not present

## 2020-03-30 DIAGNOSIS — Z87891 Personal history of nicotine dependence: Secondary | ICD-10-CM

## 2020-03-30 DIAGNOSIS — M62838 Other muscle spasm: Secondary | ICD-10-CM | POA: Diagnosis not present

## 2020-03-30 DIAGNOSIS — M5412 Radiculopathy, cervical region: Secondary | ICD-10-CM

## 2020-03-30 DIAGNOSIS — J45901 Unspecified asthma with (acute) exacerbation: Secondary | ICD-10-CM | POA: Diagnosis not present

## 2020-03-30 DIAGNOSIS — J441 Chronic obstructive pulmonary disease with (acute) exacerbation: Secondary | ICD-10-CM | POA: Diagnosis not present

## 2020-03-30 DIAGNOSIS — M4712 Other spondylosis with myelopathy, cervical region: Secondary | ICD-10-CM

## 2020-03-30 MED ORDER — MONTELUKAST SODIUM 10 MG PO TABS: 10.0000 mg | ORAL_TABLET | Freq: Every day | ORAL | 6 refills | Status: DC

## 2020-03-30 MED ORDER — PREDNISONE 10 MG PO TABS: ORAL_TABLET | ORAL | 0 refills | Status: DC

## 2020-03-30 MED ORDER — FLUTICASONE PROPIONATE 50 MCG/ACT NA SUSP: NASAL | 6 refills | Status: DC

## 2020-03-30 MED ORDER — LEVOCETIRIZINE DIHYDROCHLORIDE 5 MG PO TABS: 5.0000 mg | ORAL_TABLET | Freq: Every day | ORAL | 6 refills | Status: DC

## 2020-03-30 NOTE — Progress Notes (Signed)
Virtual Visit via Telephone Note  I connected with [name] on 03/30/20 at  2:30 PM EDT by telephone and verified that I am speaking with the correct person using two identifiers.  Location: Patient: Home Provider: Home   I discussed the limitations, risks, security and privacy concerns of performing an evaluation and management service by telephone and the availability of in person appointments. I also discussed with the patient that there may be a patient responsible charge related to this service. The patient expressed understanding and agreed to proceed.   History of Present Illness: 59 year old female, former smoker. PMH COPD/asthma overlap syndrome, perennial allergic rhinitis, HTN, GERD, pituitary cyst, seizure. Patient of Dr. Lamonte Sakai, last seen for virtual visit on 02/10/20. Spiriva respimat added back in January 2021. Maintained on Symbicort 80 twice daily, Dupixent 300mg  injection q2 weeks, Xyzal, flonase and Singulair 10mg .   03/07/2020 Patient contacted today for acute televisit for cough, shortness of breath and chest tightness. Associated chills, runny nose, voice hoarseness and shortness of breath. Her upper respiratory symptoms started Thursday March 18th. She helped her daughter move yesterday. Her grandchildren were sick with stomach virus. Reports compliance with symbicort 80 twice daily and spiriva once daily. She is not currently taking Singulair. Over the weekend she needed to use albuterol hfa three times for chest tightness and wheezing. She continues with Dupixent injections every 2 weeks at outside office. She has received her first covid vaccine march 10th, due for second injection on 3/31. She needs refills of symbicort and singulair. Patient states at the end, "To be honest with you, I'm just so confused and stressed".  03/30/2020 Patient contacted today for acute telvisit. Reported trouble breathing with associated wheezing x3 days. States that she was outside  in the yard on Sunday with her grandchildren. She went to physical therapy today for her neck and she had difficulty. Last received Dupixent April 9th. She is taking Symbicort 80 and Spiriva. She is not currently taking Flonase, Singulair or Xyzal. She needs a refill of these medications.    Observations/Objective:  - Able to speak in full sentences. Mild upper airway wheezing/voice hoarseness. No stridor or cough.   Assessment and Plan:  COPD/asthma overlap: - Acute exacerbation d/t allergen exposure  - Not current taking antihistamine or leukotriene inhibitor - Advised patient resume flonase, singulair and xyzal (refill sent) - Continue Symbicort 80 two puff twice daily; Spiriva respimat 2.5Mmcg one puff daily; prn Albuterol neb/hfa q 6 hours prn sob/wheezing - Continue Dupixent 300mg  q 14 days  - RX prednisone taper (40mg  x 3 days; 30mg  x 3 days; 20mg  x 3 days; 10mg  x 3 days)   Follow Up Instructions:  - 2 months with DR. Bryum or App or sooner if symptoms do not improve    I discussed the assessment and treatment plan with the patient. The patient was provided an opportunity to ask questions and all were answered. The patient agreed with the plan and demonstrated an understanding of the instructions.   The patient was advised to call back or seek an in-person evaluation if the symptoms worsen or if the condition fails to improve as anticipated.  I provided 22 minutes of non-face-to-face time during this encounter.   Martyn Ehrich, NP

## 2020-03-30 NOTE — Therapy (Signed)
[place], [place], [address] Phone: [phone]   Fax:  [phone]  Physical Therapy Treatment  Patient Details  Name: [name] MRN: [id number] Date of Birth: May 30, 1961 Referring Provider (PT): Dr Frederich Cha   Encounter Date: 03/30/2020  PT End of Session - 03/30/20 0905    Visit Number  7    Number of Visits  12    Date for PT Re-Evaluation  04/11/20    Authorization Type  [employer]    PT Start Time  [phone]    PT Stop Time  0930    PT Time Calculation (min)  44 min    Activity Tolerance  Patient tolerated treatment well    Behavior During Therapy  [facility] for tasks assessed/performed       Past Medical History:  Diagnosis Date  . ALLERGIC RHINITIS 05/12/2009   Qualifier: Diagnosis of  By: Lenn Cal Deborra Medina), Wynona Canes    . Anemia, iron deficiency 12/22/2014  . Angina   . Anxiety   . Arthritis   . Asthma   . ASTHMA 05/12/2009   Severe AFL (Spirometry 05/2009: pre-BD FEV1 0.87L 34% pred, post-BD FEV1 1.11L 44% pred) Volumes hyperinflated Decreased DLCO that does not fully correct to normal range for alveolar volume.     Marland Kitchen COPD 08/24/2009   Qualifier: Diagnosis of  By: Burnett Kanaris    . COPD (chronic obstructive pulmonary disease) ([place])   . Depression   . Fibromyalgia 05/14/2014  . GERD (gastroesophageal reflux disease)   . Hyperlipidemia 04/20/2017  . HYPERTENSION 05/12/2009   Qualifier: Diagnosis of  By: Lenn Cal Deborra Medina), Wynona Canes    . Migraine   . Nephrolithiasis   . Peripheral vascular disease ([place])   . Prediabetes 02/23/2014  . Seizure ([place])   . Seizure ([place])   . Urticaria     Past Surgical History:  Procedure Laterality Date  . ABDOMINAL HYSTERECTOMY    . BACK SURGERY    . COLONOSCOPY  12/20/2011   Procedure: COLONOSCOPY;  Surgeon: Landry Dyke, MD;  Location: [facility];  Service: Endoscopy;  Laterality: N/A;  . COLONOSCOPY  03/05/2012   Procedure: COLONOSCOPY;  Surgeon:  Landry Dyke, MD;  Location: [facility];  Service: Endoscopy;  Laterality: N/A;  . DIAGNOSTIC LAPAROSCOPY    . HEMORRHOID SURGERY    . INCISE AND DRAIN ABCESS    . KIDNEY STONE SURGERY    . NECK SURGERY     x 2 Dr Orinda Kenner  . SPINE SURGERY  2019  . TOE SURGERY    . TUBAL LIGATION      There were no vitals filed for this visit.  Subjective Assessment - 03/30/20 0851    Subjective  Patient reports she has minor pain inher left shoulder. Sh had a hard time falling asleep but it wasn;t because of her neck.    Pertinent History  Lumbar spien surgery (unspeicifeid in the chart), Asthma, Fibromyalgia, Seizures,    Diagnostic tests  Had an MRI but not in the chart    Patient Stated Goals  to aviod surgery and pain mediciations    Currently in Pain?  Yes    Pain Score  2     Pain Location  Neck    Pain Orientation  Left    Pain Descriptors / Indicators  Aching    Pain Type  Chronic pain    Pain Radiating Towards  pain no longer radiating    Pain Onset  More  than a month ago    Pain Frequency  Intermittent    Aggravating Factors   bending head forward    Pain Relieving Factors  rest    Multiple Pain Sites  No                       [facility] Adult PT Treatment/Exercise - 03/30/20 0001      Neck Exercises: Standing   Other Standing Exercises  standing shoulder extension 2x10 green; scap retraction x20 green      Neck Exercises: Seated   Other Seated Exercise  seated bilateral er red 2x10     Other Seated Exercise  wand flexion 2x10 1lb       Manual Therapy   Soft tissue mobilization  sot tissue mobilization to upper traps and cervical parapsinals ;    Manual Traction  genlte manual traction to cervical spine      Neck Exercises: Stretches   Upper Trapezius Stretch  20 seconds;3 reps    Levator Stretch  3 reps;20 seconds    Other Neck Stretches  posterior capsule stretch 2x30 sec hold     Other Neck Stretches  sink Rhomiboid stretch 2x20 sec hold               PT Education - 03/30/20 0905    Education Details  HEP and symptom mangement    Person(s) Educated  Patient    Methods  Explanation;Demonstration;Tactile cues;Verbal cues    Comprehension  Verbalized understanding;Returned demonstration;Verbal cues required;Tactile cues required       PT Short Term Goals - 03/30/20 1609      PT SHORT TERM GOAL #1   Title  Patient will increase cervical extension by 10 degrees    Time  3    Period  Weeks    Status  On-going    Target Date  03/21/20      PT SHORT TERM GOAL #2   Title  Patient will increase bilateral cervical rotation by 20 degrees bilateral    Time  3    Period  Weeks    Status  On-going    Target Date  03/21/20      PT SHORT TERM GOAL #3   Title  Patient will increase gross left grip strength to 20 lbs    Time  3    Period  Weeks    Status  On-going    Target Date  03/21/20        PT Long Term Goals - 02/29/20 0946      PT LONG TERM GOAL #1   Title  Patient will report no radicular pain down her left arm whilre carrying grocery bags    Time  6    Period  Weeks    Status  New    Target Date  04/11/20      PT LONG TERM GOAL #2   Title  Patient will increase bilateral cervical rotation to 60 degrees in order to improve community safety    Time  6    Period  Weeks    Status  New    Target Date  04/11/20      PT LONG TERM GOAL #3   Title  Patient will increase pain free cervical flexion and extension by 20 degrees in order to use a computer without pain    Time  6    Period  Weeks    Status  New    Target  Date  04/11/20            Plan - 03/30/20 0907    Clinical Impression Statement  Most of the patients spasming today was along her periscapuar area today and less in the upper trap. She was given periscapiular stretching for home. Patient was also given shoulder strengthening exercises. Therapy will re-assess POC next week.    Comorbidities  asthma, lumbar fusion, fibromyalgia, depression     Examination-Activity Limitations  Bed Mobility;Locomotion Level;Lift;Reach Overhead;Carry    Examination-Participation Restrictions  Laundry;Cleaning;Community Activity;Shop    Stability/Clinical Decision Making  Evolving/Moderate complexity    Rehab Potential  Good    PT Frequency  2x / week    PT Duration  6 weeks    PT Treatment/Interventions  ADLs/Self Care Home Management;Electrical Stimulation;Iontophoresis 4mg /ml Dexamethasone;Moist Heat;Functional mobility training;Therapeutic activities;Therapeutic exercise;Neuromuscular re-education;Patient/family education;Manual techniques;Passive range of motion;Taping;Dry needling    PT Next Visit Plan  begin light stretching and scap retraction; begin light manual therapy. Patient had a minor increase in pain today with soft tissue mobilization to upper trap and peri-scpaular area. consider light manual traction; re-assess next week.    PT Home Exercise Plan  reviewed relaxation techniques, cervical rotation 3x in pain free range and sefl soft tissue mobilization to the upper trap    Consulted and Agree with Plan of Care  Patient       Patient will benefit from skilled therapeutic intervention in order to improve the following deficits and impairments:  Decreased range of motion, Increased fascial restricitons, Increased muscle spasms, Impaired UE functional use, Decreased activity tolerance, Pain, Decreased strength, Improper body mechanics, Postural dysfunction  Visit Diagnosis: Radiculopathy, cervical region  Other muscle spasm  Cervical spondylosis with myelopathy     Problem List Patient Active Problem List   Diagnosis Date Noted  . Leg cramping 08/12/2019  . Hypophosphatemia 01/07/2019  . Vertigo 09/23/2018  . Dysfunction of left eustachian tube 09/23/2018  . Gait disorder 04/21/2018  . Leukocytosis 04/15/2018  . Nausea & vomiting 04/15/2018  . Seizure ([place])   . Cervical radiculopathy 02/18/2018  . 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 Syncope 09/04/2017  . Constipation 09/04/2017  . Nonintractable headache 09/04/2017  . Leg swelling 07/26/2017  . 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 Hyperlipidemia 04/20/2017  . Pedal edema 04/19/2017  . Bipolar I disorder ([place]) 09/19/2016  . Facial pain 07/12/2016  . Rash 06/14/2016  . Migraine 01/25/2016  . Idiopathic urticaria/pruritus 01/23/2016  . Perennial allergic rhinitis 01/23/2016  . Oral candidiasis 01/23/2016  . Greater trochanteric bursitis of both hips 09/08/2015  . Chest pain 06/22/2015  . Itching 06/22/2015  . Bilateral knee pain 06/22/2015  . Bilateral hip pain 06/22/2015  . Anemia, iron deficiency 12/22/2014  . Fatigue 12/21/2014  . Intertrigo 08/07/2014  . Dyspareunia 07/12/2014  . Menopausal syndrome (hot flushes) 07/12/2014  . History of tobacco abuse 07/12/2014  . Dizziness 06/22/2014  . Weakness 06/22/2014  . Fibromyalgia 05/14/2014  . Preventative health care 04/22/2014  . Recurrent boils 04/22/2014  . Recurrent falls 04/22/2014  . Peripheral vascular disease ([place])   . Depression   . Anxiety   . GERD (gastroesophageal reflux disease)   . Prediabetes 02/23/2014  . Screening mammogram for high-risk patient 02/23/2014  . Back pain 07/22/2013  . COPD/asthma 08/24/2009  . Headache(784.0) 08/24/2009  . HTN (hypertension) 05/12/2009    Carney Living 03/30/2020, [place] [facility] [address] [place], [place], [address] Phone: [phone]  Fax:  [phone]  Name: [name] MRN: [id number] Date of Birth: 1961-03-27

## 2020-03-30 NOTE — Patient Instructions (Addendum)
Resume flonase nasal spray once daily Resume Xyzal 5mg  daily Resume Singulair 10mg  at bedtime  Continue Symbicort 80 two puff twice daily; Spiriva respimat 2.5Mmcg one puff daily; prn Albuterol neb/hfa q 6 hours prn sob/wheezing  Rx: Prednisone taper as prescribed  Follow-up: Due for follow-up in June with [name] or sooner if symptoms do not improve

## 2020-03-30 NOTE — Telephone Encounter (Signed)
Spoke with pt she has an appt at 2pm today. Will close encounter.

## 2020-03-30 NOTE — Telephone Encounter (Signed)
ATC patient unable to reach LM to call back office (x1)  

## 2020-04-04 ENCOUNTER — Other Ambulatory Visit: Payer: Self-pay

## 2020-04-04 ENCOUNTER — Ambulatory Visit: Payer: Medicare HMO | Admitting: Physical Therapy

## 2020-04-04 ENCOUNTER — Encounter: Payer: Self-pay | Admitting: Physical Therapy

## 2020-04-04 DIAGNOSIS — M5412 Radiculopathy, cervical region: Secondary | ICD-10-CM

## 2020-04-04 DIAGNOSIS — M4712 Other spondylosis with myelopathy, cervical region: Secondary | ICD-10-CM

## 2020-04-04 DIAGNOSIS — M62838 Other muscle spasm: Secondary | ICD-10-CM

## 2020-04-04 NOTE — Therapy (Signed)
[place], [place], [address] Phone: [phone]   Fax:  [phone]  Physical Therapy Treatment  Patient Details  Name: [name] MRN: [id number] Date of Birth: [birth year] Referring Provider (PT): Dr Frederich Cha   Encounter Date: 04/04/2020  PT End of Session - 04/04/20 0905    Visit Number  8    Number of Visits  12    Date for PT Re-Evaluation  04/11/20    Authorization Type  [employer]    PT Start Time  0847    PT Stop Time  0927    PT Time Calculation (min)  40 min    Activity Tolerance  Patient tolerated treatment well    Behavior During Therapy  [facility] for tasks assessed/performed       Past Medical History:  Diagnosis Date  . ALLERGIC RHINITIS 05/12/2009   Qualifier: Diagnosis of  By: Lenn Cal Deborra Medina), Wynona Canes    . Anemia, iron deficiency 12/22/2014  . Angina   . Anxiety   . Arthritis   . Asthma   . ASTHMA 05/12/2009   Severe AFL (Spirometry 05/2009: pre-BD FEV1 0.87L 34% pred, post-BD FEV1 1.11L 44% pred) Volumes hyperinflated Decreased DLCO that does not fully correct to normal range for alveolar volume.     Marland Kitchen COPD 08/24/2009   Qualifier: Diagnosis of  By: Burnett Kanaris    . COPD (chronic obstructive pulmonary disease) ([place])   . Depression   . Fibromyalgia 05/14/2014  . GERD (gastroesophageal reflux disease)   . Hyperlipidemia 04/20/2017  . HYPERTENSION 05/12/2009   Qualifier: Diagnosis of  By: Lenn Cal Deborra Medina), Wynona Canes    . Migraine   . Nephrolithiasis   . Peripheral vascular disease ([place])   . Prediabetes 02/23/2014  . Seizure ([place])   . Seizure ([place])   . Urticaria     Past Surgical History:  Procedure Laterality Date  . ABDOMINAL HYSTERECTOMY    . BACK SURGERY    . COLONOSCOPY  12/20/2011   Procedure: COLONOSCOPY;  Surgeon: Landry Dyke, MD;  Location: [facility];  Service: Endoscopy;  Laterality: N/A;  . COLONOSCOPY  03/05/2012   Procedure: COLONOSCOPY;  Surgeon:  Landry Dyke, MD;  Location: [facility];  Service: Endoscopy;  Laterality: N/A;  . DIAGNOSTIC LAPAROSCOPY    . HEMORRHOID SURGERY    . INCISE AND DRAIN ABCESS    . KIDNEY STONE SURGERY    . NECK SURGERY     x 2 Dr Orinda Kenner  . SPINE SURGERY  2019  . TOE SURGERY    . TUBAL LIGATION      There were no vitals filed for this visit.  Subjective Assessment - 04/04/20 0851    Subjective  Patient reports her neck and shoulders have been doing well but she has had back pain.    Pertinent History  Lumbar spien surgery (unspeicifeid in the chart), Asthma, Fibromyalgia, Seizures,    Diagnostic tests  Had an MRI but not in the chart    Patient Stated Goals  to aviod surgery and pain mediciations    Currently in Pain?  No/denies                       [facility] Adult PT Treatment/Exercise - 04/04/20 0001      Neck Exercises: Standing   Other Standing Exercises  standing shoulder extension 2x10 green; scap retraction x20 green      Neck Exercises: Seated   Other Seated  Exercise  seated bilateral er red 2x10     Other Seated Exercise  wand flexion 2x10 1lb       Manual Therapy   Soft tissue mobilization  sot tissue mobilization to upper traps and cervical parapsinals ;    Manual Traction  genlte manual traction to cervical spine      Neck Exercises: Stretches   Upper Trapezius Stretch  20 seconds;3 reps    Levator Stretch  3 reps;20 seconds             PT Education - 04/04/20 0851    Education Details  reviewed improtance of posture    Person(s) Educated  Patient    Methods  Explanation;Demonstration;Tactile cues;Verbal cues    Comprehension  Returned demonstration;Verbal cues required;Tactile cues required;Verbalized understanding       PT Short Term Goals - 03/30/20 1609      PT SHORT TERM GOAL #1   Title  Patient will increase cervical extension by 10 degrees    Time  3    Period  Weeks    Status  On-going    Target Date  03/21/20      PT SHORT TERM  GOAL #2   Title  Patient will increase bilateral cervical rotation by 20 degrees bilateral    Time  3    Period  Weeks    Status  On-going    Target Date  03/21/20      PT SHORT TERM GOAL #3   Title  Patient will increase gross left grip strength to 20 lbs    Time  3    Period  Weeks    Status  On-going    Target Date  03/21/20        PT Long Term Goals - 02/29/20 0946      PT LONG TERM GOAL #1   Title  Patient will report no radicular pain down her left arm whilre carrying grocery bags    Time  6    Period  Weeks    Status  New    Target Date  04/11/20      PT LONG TERM GOAL #2   Title  Patient will increase bilateral cervical rotation to 60 degrees in order to improve community safety    Time  6    Period  Weeks    Status  New    Target Date  04/11/20      PT LONG TERM GOAL #3   Title  Patient will increase pain free cervical flexion and extension by 20 degrees in order to use a computer without pain    Time  6    Period  Weeks    Status  New    Target Date  04/11/20            Plan - 04/04/20 0906    Clinical Impression Statement  Patient continues to make good progress. She had no isgnificant pain with ther-ex. She continues to have spasming but the area is much smaller and less sensatvie. She did have some lower back pain today. She has had that in the past. Therapy will continue to monitor    Personal Factors and Comorbidities  Comorbidity 1;Comorbidity 2;Comorbidity 3+    Comorbidities  asthma, lumbar fusion, fibromyalgia, depression    Examination-Activity Limitations  Bed Mobility;Locomotion Level;Lift;Reach Overhead;Carry    Examination-Participation Restrictions  Laundry;Cleaning;Community Activity;Shop    Stability/Clinical Decision Making  Evolving/Moderate complexity    Clinical Decision Making  Moderate  Rehab Potential  Good    PT Frequency  2x / week    PT Duration  6 weeks    PT Treatment/Interventions  ADLs/Self Care Home  Management;Electrical Stimulation;Iontophoresis 4mg /ml Dexamethasone;Moist Heat;Functional mobility training;Therapeutic activities;Therapeutic exercise;Neuromuscular re-education;Patient/family education;Manual techniques;Passive range of motion;Taping;Dry needling    PT Next Visit Plan  re-assess. Continue with strengthening    PT Home Exercise Plan  reviewed relaxation techniques, cervical rotation 3x in pain free range and sefl soft tissue mobilization to the upper trap    Consulted and Agree with Plan of Care  Patient       Patient will benefit from skilled therapeutic intervention in order to improve the following deficits and impairments:  Decreased range of motion, Increased fascial restricitons, Increased muscle spasms, Impaired UE functional use, Decreased activity tolerance, Pain, Decreased strength, Improper body mechanics, Postural dysfunction  Visit Diagnosis: Radiculopathy, cervical region  Other muscle spasm  Cervical spondylosis with myelopathy     Problem List Patient Active Problem List   Diagnosis Date Noted  . Leg cramping 08/12/2019  . Hypophosphatemia 01/07/2019  . Vertigo 09/23/2018  . Dysfunction of left eustachian tube 09/23/2018  . Gait disorder 04/21/2018  . Leukocytosis 04/15/2018  . Nausea & vomiting 04/15/2018  . Seizure ([place])   . Cervical radiculopathy 02/18/2018  . 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 Syncope 09/04/2017  . Constipation 09/04/2017  . Nonintractable headache 09/04/2017  . Leg swelling 07/26/2017  . 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 Hyperlipidemia 04/20/2017  . Pedal edema 04/19/2017  . Bipolar I disorder ([place]) 09/19/2016  . Facial pain 07/12/2016  . Rash 06/14/2016  . Migraine 01/25/2016  . Idiopathic urticaria/pruritus 01/23/2016  . Perennial allergic rhinitis 01/23/2016  . Oral candidiasis 01/23/2016  . Greater trochanteric bursitis of both hips 09/08/2015  . Chest pain 06/22/2015  . Itching 06/22/2015  .  Bilateral knee pain 06/22/2015  . Bilateral hip pain 06/22/2015  . Anemia, iron deficiency 12/22/2014  . Fatigue 12/21/2014  . Intertrigo 08/07/2014  . Dyspareunia 07/12/2014  . Menopausal syndrome (hot flushes) 07/12/2014  . History of tobacco abuse 07/12/2014  . Dizziness 06/22/2014  . Weakness 06/22/2014  . Fibromyalgia 05/14/2014  . Preventative health care 04/22/2014  . Recurrent boils 04/22/2014  . Recurrent falls 04/22/2014  . Peripheral vascular disease ([place])   . Depression   . Anxiety   . GERD (gastroesophageal reflux disease)   . Prediabetes 02/23/2014  . Screening mammogram for high-risk patient 02/23/2014  . Back pain 07/22/2013  . COPD/asthma 08/24/2009  . Headache(784.0) 08/24/2009  . HTN (hypertension) 05/12/2009    Carney Living PT DPT  04/04/2020, 10:38 AM  [facility] [address] [place], [place], [address] Phone: [phone]   Fax:  [phone]  Name: TITANIA YTURRALDE MRN: [id number] Date of Birth: 24-Mar-1961

## 2020-04-06 ENCOUNTER — Ambulatory Visit: Payer: Medicare HMO | Admitting: Physical Therapy

## 2020-04-06 ENCOUNTER — Other Ambulatory Visit: Payer: Self-pay

## 2020-04-06 ENCOUNTER — Encounter: Payer: Self-pay | Admitting: Physical Therapy

## 2020-04-06 DIAGNOSIS — M4712 Other spondylosis with myelopathy, cervical region: Secondary | ICD-10-CM | POA: Diagnosis not present

## 2020-04-06 DIAGNOSIS — M5412 Radiculopathy, cervical region: Secondary | ICD-10-CM

## 2020-04-06 DIAGNOSIS — M62838 Other muscle spasm: Secondary | ICD-10-CM | POA: Diagnosis not present

## 2020-04-06 NOTE — Therapy (Signed)
[name], [place], [address] Phone: [phone]   Fax:  [phone]  Physical Therapy Treatment/Re-cert   Patient Details  Name: [name] MRN: [id number] Date of Birth: Jun 12, 1961 Referring Provider (PT): Dr Frederich Cha   Encounter Date: 04/06/2020  PT End of Session - 04/06/20 0851    Visit Number  9    Number of Visits  15    Date for PT Re-Evaluation  05/18/20    Authorization Type  [employer]    PT Start Time  0848    PT Stop Time  0930    PT Time Calculation (min)  42 min    Activity Tolerance  Patient tolerated treatment well    Behavior During Therapy  [facility] for tasks assessed/performed       Past Medical History:  Diagnosis Date  . ALLERGIC RHINITIS 05/12/2009   Qualifier: Diagnosis of  By: Lenn Cal Deborra Medina), Wynona Canes    . Anemia, iron deficiency 12/22/2014  . Angina   . Anxiety   . Arthritis   . Asthma   . ASTHMA 05/12/2009   Severe AFL (Spirometry 05/2009: pre-BD FEV1 0.87L 34% pred, post-BD FEV1 1.11L 44% pred) Volumes hyperinflated Decreased DLCO that does not fully correct to normal range for alveolar volume.     Marland Kitchen COPD 08/24/2009   Qualifier: Diagnosis of  By: Burnett Kanaris    . COPD (chronic obstructive pulmonary disease) ([place])   . Depression   . Fibromyalgia 05/14/2014  . GERD (gastroesophageal reflux disease)   . Hyperlipidemia 04/20/2017  . HYPERTENSION 05/12/2009   Qualifier: Diagnosis of  By: Lenn Cal Deborra Medina), Wynona Canes    . Migraine   . Nephrolithiasis   . Peripheral vascular disease ([place])   . Prediabetes 02/23/2014  . Seizure ([place])   . Seizure ([place])   . Urticaria     Past Surgical History:  Procedure Laterality Date  . ABDOMINAL HYSTERECTOMY    . BACK SURGERY    . COLONOSCOPY  12/20/2011   Procedure: COLONOSCOPY;  Surgeon: Landry Dyke, MD;  Location: [facility];  Service: Endoscopy;  Laterality: N/A;  . COLONOSCOPY  03/05/2012   Procedure: COLONOSCOPY;   Surgeon: Landry Dyke, MD;  Location: [facility];  Service: Endoscopy;  Laterality: N/A;  . DIAGNOSTIC LAPAROSCOPY    . HEMORRHOID SURGERY    . INCISE AND DRAIN ABCESS    . KIDNEY STONE SURGERY    . NECK SURGERY     x 2 Dr Orinda Kenner  . SPINE SURGERY  2019  . TOE SURGERY    . TUBAL LIGATION      There were no vitals filed for this visit.  Subjective Assessment - 04/06/20 0850    Subjective  Patient reports she is feeling bettter but she still has pain reaching with the left side and with sudden movements. When she does activity like reaching she has more pain. She has a little more pain this morning but can not remeber doing anything that would increase the pain.    Pertinent History  Lumbar spien surgery (unspeicifeid in the chart), Asthma, Fibromyalgia, Seizures,    Diagnostic tests  Had an MRI but not in the chart    Patient Stated Goals  to aviod surgery and pain mediciations    Currently in Pain?  Yes    Pain Score  3     Pain Location  Neck    Pain Orientation  Left    Pain Descriptors / Indicators  Aching    Pain Type  Chronic pain    Pain Onset  More than a month ago    Pain Frequency  Constant    Aggravating Factors   reaching with her arm    Pain Relieving Factors  rest    Multiple Pain Sites  No         [facility] PT Assessment - 04/06/20 0001      AROM   Overall AROM Comments  left active shoulder flexion 123     Cervical - Right Rotation  70    Cervical - Left Rotation  55      Strength   Left Shoulder Flexion  4+/5    Left Shoulder Internal Rotation  5/5    Left Shoulder External Rotation  5/5    Right Hand Grip (lbs)  35    Left Hand Grip (lbs)  20      Palpation   Palpation comment  significant spasming bilateral of her uppr traps, cervical paraspinals, and peri-scxpaular muscles R.>L                    [facility] Adult PT Treatment/Exercise - 04/06/20 0001      Self-Care   Self-Care  Other Self-Care Comments    Other Self-Care Comments    reviewed current improvements and deficits as well as plan of care going forward       Neck Exercises: Standing   Other Standing Exercises  standing shoulder extension 2x10 green; scap retraction x20 green      Neck Exercises: Seated   Other Seated Exercise  seated bilateral er red 2x10     Other Seated Exercise  wand flexion 2x10 1lb ; UE ranger       Manual Therapy   Soft tissue mobilization  performed soft tissue mobilzisation but the patient did not tolerate well. She tolerated self soft tissue mobilization better.     Manual Traction  genlte manual traction to cervical spine      Neck Exercises: Stretches   Upper Trapezius Stretch  20 seconds;3 reps    Levator Stretch  3 reps;20 seconds             PT Education - 04/06/20 2044    Education Details  reviewed HEP and symptom mangement    Person(s) Educated  Patient    Methods  Explanation;Demonstration;Tactile cues;Verbal cues    Comprehension  Verbalized understanding;Returned demonstration;Verbal cues required;Tactile cues required       PT Short Term Goals - 04/06/20 2045      PT SHORT TERM GOAL #1   Title  Patient will increase cervical extension by 10 degrees    Time  3    Period  Weeks    Status  On-going    Target Date  03/21/20      PT SHORT TERM GOAL #2   Title  Patient will increase bilateral cervical rotation by 20 degrees bilateral    Baseline  58 right 70 left    Time  3    Period  Weeks    Status  On-going    Target Date  03/21/20      PT SHORT TERM GOAL #3   Title  Patient will increase gross left grip strength to 20 lbs    Baseline  improved to 20    Time  3    Period  Weeks    Status  On-going        PT Long Term  Goals - 02/29/20 0946      PT LONG TERM GOAL #1   Title  Patient will report no radicular pain down her left arm whilre carrying grocery bags    Time  6    Period  Weeks    Status  New    Target Date  04/11/20      PT LONG TERM GOAL #2   Title  Patient will increase  bilateral cervical rotation to 60 degrees in order to improve community safety    Time  6    Period  Weeks    Status  New    Target Date  04/11/20      PT LONG TERM GOAL #3   Title  Patient will increase pain free cervical flexion and extension by 20 degrees in order to use a computer without pain    Time  6    Period  Weeks    Status  New    Target Date  04/11/20            Plan - 04/06/20 0908    Clinical Impression Statement  Patient is making progress but it is still llimited in left cerivcal motion and she still has a significant spasm in her left upper trap. She was advised to get a shepards hook for home. She does better with self soft tissue mobilization. her left shoulder motion is stil limited but she can now reach past 90- degrees. She tolerated ther-ex well today but was more sensative to manual therapy. She would benefit from further skilled therapy 1W6. She was also given sleep tips today. She is having difficulty sleeping which is likley effecting her stress levels. Patient perfromed self soft tissue mobilization today.,    Personal Factors and Comorbidities  Comorbidity 1;Comorbidity 2;Comorbidity 3+    Comorbidities  asthma, lumbar fusion, fibromyalgia, depression    Examination-Activity Limitations  Bed Mobility;Locomotion Level;Lift;Reach Overhead;Carry    Examination-Participation Restrictions  Laundry;Cleaning;Community Activity;Shop    Stability/Clinical Decision Making  Evolving/Moderate complexity    Clinical Decision Making  Moderate    Rehab Potential  Good    PT Frequency  1x / week    PT Duration  6 weeks    PT Treatment/Interventions  ADLs/Self Care Home Management;Electrical Stimulation;Iontophoresis 4mg /ml Dexamethasone;Moist Heat;Functional mobility training;Therapeutic activities;Therapeutic exercise;Neuromuscular re-education;Patient/family education;Manual techniques;Passive range of motion;Taping;Dry needling    PT Next Visit Plan  re-assess.  Continue with strengthening    PT Home Exercise Plan  reviewed relaxation techniques, cervical rotation 3x in pain free range and sefl soft tissue mobilization to the upper trap    Consulted and Agree with Plan of Care  Patient       Patient will benefit from skilled therapeutic intervention in order to improve the following deficits and impairments:  Decreased range of motion, Increased fascial restricitons, Increased muscle spasms, Impaired UE functional use, Decreased activity tolerance, Pain, Decreased strength, Improper body mechanics, Postural dysfunction  Visit Diagnosis: Radiculopathy, cervical region  Other muscle spasm  Cervical spondylosis with myelopathy     Problem List Patient Active Problem List   Diagnosis Date Noted  . Leg cramping 08/12/2019  . Hypophosphatemia 01/07/2019  . Vertigo 09/23/2018  . Dysfunction of left eustachian tube 09/23/2018  . Gait disorder 04/21/2018  . Leukocytosis 04/15/2018  . Nausea & vomiting 04/15/2018  . Seizure ([place])   . Cervical radiculopathy 02/18/2018  . 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 Syncope 09/04/2017  . Constipation 09/04/2017  . Nonintractable headache 09/04/2017  .  Leg swelling 07/26/2017  . 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 Hyperlipidemia 04/20/2017  . Pedal edema 04/19/2017  . Bipolar I disorder ([place]) 09/19/2016  . Facial pain 07/12/2016  . Rash 06/14/2016  . Migraine 01/25/2016  . Idiopathic urticaria/pruritus 01/23/2016  . Perennial allergic rhinitis 01/23/2016  . Oral candidiasis 01/23/2016  . Greater trochanteric bursitis of both hips 09/08/2015  . Chest pain 06/22/2015  . Itching 06/22/2015  . Bilateral knee pain 06/22/2015  . Bilateral hip pain 06/22/2015  . Anemia, iron deficiency 12/22/2014  . Fatigue 12/21/2014  . Intertrigo 08/07/2014  . Dyspareunia 07/12/2014  . Menopausal syndrome (hot flushes) 07/12/2014  . History of tobacco abuse 07/12/2014  . Dizziness 06/22/2014  . Weakness  06/22/2014  . Fibromyalgia 05/14/2014  . Preventative health care 04/22/2014  . Recurrent boils 04/22/2014  . Recurrent falls 04/22/2014  . Peripheral vascular disease ([place])   . Depression   . Anxiety   . GERD (gastroesophageal reflux disease)   . Prediabetes 02/23/2014  . Screening mammogram for high-risk patient 02/23/2014  . Back pain 07/22/2013  . COPD/asthma 08/24/2009  . Headache(784.0) 08/24/2009  . HTN (hypertension) 05/12/2009    Carney Living PT DPT  04/06/2020, 8:51 PM  [facility] [address] [place], [place], [address] Phone: [phone]   Fax:  [phone]  Name: [name] MRN: [id number] Date of Birth: 02/07/1961

## 2020-04-08 ENCOUNTER — Other Ambulatory Visit: Payer: Self-pay

## 2020-04-08 ENCOUNTER — Ambulatory Visit (INDEPENDENT_AMBULATORY_CARE_PROVIDER_SITE_OTHER): Payer: Medicare HMO | Admitting: *Deleted

## 2020-04-08 ENCOUNTER — Ambulatory Visit: Payer: Self-pay

## 2020-04-08 DIAGNOSIS — J4540 Moderate persistent asthma, uncomplicated: Secondary | ICD-10-CM | POA: Diagnosis not present

## 2020-04-12 ENCOUNTER — Ambulatory Visit: Payer: Medicare HMO | Admitting: Physical Therapy

## 2020-04-14 ENCOUNTER — Other Ambulatory Visit: Payer: Self-pay | Admitting: Internal Medicine

## 2020-04-22 ENCOUNTER — Ambulatory Visit: Payer: Medicare HMO

## 2020-04-22 ENCOUNTER — Ambulatory Visit: Payer: Medicare HMO | Attending: Physical Medicine and Rehabilitation | Admitting: Physical Therapy

## 2020-04-22 ENCOUNTER — Other Ambulatory Visit: Payer: Self-pay

## 2020-04-22 ENCOUNTER — Encounter: Payer: Self-pay | Admitting: Physical Therapy

## 2020-04-22 DIAGNOSIS — M5412 Radiculopathy, cervical region: Secondary | ICD-10-CM | POA: Diagnosis not present

## 2020-04-22 DIAGNOSIS — M4712 Other spondylosis with myelopathy, cervical region: Secondary | ICD-10-CM | POA: Diagnosis not present

## 2020-04-22 DIAGNOSIS — M62838 Other muscle spasm: Secondary | ICD-10-CM | POA: Diagnosis not present

## 2020-04-22 NOTE — Therapy (Signed)
[place], [place], [address] Phone: [phone]   Fax:  [phone]  Physical Therapy Treatment  Patient Details  Name: [name] MRN: [id number] Date of Birth: February 13, 1961 Referring Provider (PT): Dr Frederich Cha   Encounter Date: 04/22/2020  PT End of Session - 04/22/20 1157    Visit Number  10    Number of Visits  15    Date for PT Re-Evaluation  05/18/20    Authorization Type  [employer]    PT Start Time  0845    PT Stop Time  0924    PT Time Calculation (min)  39 min    Activity Tolerance  Patient tolerated treatment well    Behavior During Therapy  [facility] for tasks assessed/performed       Past Medical History:  Diagnosis Date  . ALLERGIC RHINITIS 05/12/2009   Qualifier: Diagnosis of  By: Lenn Cal Deborra Medina), Wynona Canes    . Anemia, iron deficiency 12/22/2014  . Angina   . Anxiety   . Arthritis   . Asthma   . ASTHMA 05/12/2009   Severe AFL (Spirometry 05/2009: pre-BD FEV1 0.87L 34% pred, post-BD FEV1 1.11L 44% pred) Volumes hyperinflated Decreased DLCO that does not fully correct to normal range for alveolar volume.     Marland Kitchen COPD 08/24/2009   Qualifier: Diagnosis of  By: Burnett Kanaris    . COPD (chronic obstructive pulmonary disease) ([place])   . Depression   . Fibromyalgia 05/14/2014  . GERD (gastroesophageal reflux disease)   . Hyperlipidemia 04/20/2017  . HYPERTENSION 05/12/2009   Qualifier: Diagnosis of  By: Lenn Cal Deborra Medina), Wynona Canes    . Migraine   . Nephrolithiasis   . Peripheral vascular disease ([place])   . Prediabetes 02/23/2014  . Seizure ([place])   . Seizure ([place])   . Urticaria     Past Surgical History:  Procedure Laterality Date  . ABDOMINAL HYSTERECTOMY    . BACK SURGERY    . COLONOSCOPY  12/20/2011   Procedure: COLONOSCOPY;  Surgeon: Landry Dyke, MD;  Location: [facility];  Service: Endoscopy;  Laterality: N/A;  . COLONOSCOPY  03/05/2012   Procedure: COLONOSCOPY;  Surgeon:  Landry Dyke, MD;  Location: [facility];  Service: Endoscopy;  Laterality: N/A;  . DIAGNOSTIC LAPAROSCOPY    . HEMORRHOID SURGERY    . INCISE AND DRAIN ABCESS    . KIDNEY STONE SURGERY    . NECK SURGERY     x 2 Dr Orinda Kenner  . SPINE SURGERY  2019  . TOE SURGERY    . TUBAL LIGATION      There were no vitals filed for this visit.  Subjective Assessment - 04/22/20 0851    Subjective  Patient continues to report lower back pain. her neck is feeling better. She has had increased lower back pain over the past week    Pertinent History  Lumbar spien surgery (unspeicifeid in the chart), Asthma, Fibromyalgia, Seizures,    Diagnostic tests  Had an MRI but not in the chart    Patient Stated Goals  to aviod surgery and pain mediciations    Currently in Pain?  Yes    Pain Score  6     Pain Location  Back    Pain Orientation  Left;Right    Pain Descriptors / Indicators  Aching    Pain Type  Chronic pain    Pain Onset  More than a month ago    Pain Frequency  Constant  Aggravating Factors   standing and walking                       [facility] Adult PT Treatment/Exercise - 04/22/20 0001      Self-Care   Other Self-Care Comments   demonstrated how to use tennis ball for home therapy       Neck Exercises: Standing   Other Standing Exercises  standing shoulder extension 3x10 redscap retraction red 3x10       Neck Exercises: Seated   Other Seated Exercise  seated bilateral er red 2x10 ; horizontal abduction 2x10     Other Seated Exercise  wand flexion 2x10 1lb ; UE ranger       Manual Therapy   Soft tissue mobilization  attmepted soft tissue mobilization but patient continues to have low tolerance to manual therapy.       Neck Exercises: Stretches   Upper Trapezius Stretch  20 seconds;3 reps    Levator Stretch  3 reps;20 seconds    Other Neck Stretches  reveiwed LTR for total spine mobility as well as hamstring stretching to help patient bend over better. She has had  some pain lenaing to clean              PT Education - 04/22/20 0859    Education Details  reviewed technique with ther-ex    Person(s) Educated  Patient    Methods  Explanation;Demonstration;Tactile cues;Verbal cues    Comprehension  Returned demonstration;Tactile cues required;Verbal cues required;Verbalized understanding       PT Short Term Goals - 04/06/20 2045      PT SHORT TERM GOAL #1   Title  Patient will increase cervical extension by 10 degrees    Time  3    Period  Weeks    Status  On-going    Target Date  03/21/20      PT SHORT TERM GOAL #2   Title  Patient will increase bilateral cervical rotation by 20 degrees bilateral    Baseline  58 right 70 left    Time  3    Period  Weeks    Status  On-going    Target Date  03/21/20      PT SHORT TERM GOAL #3   Title  Patient will increase gross left grip strength to 20 lbs    Baseline  improved to 20    Time  3    Period  Weeks    Status  On-going        PT Long Term Goals - 02/29/20 0946      PT LONG TERM GOAL #1   Title  Patient will report no radicular pain down her left arm whilre carrying grocery bags    Time  6    Period  Weeks    Status  New    Target Date  04/11/20      PT LONG TERM GOAL #2   Title  Patient will increase bilateral cervical rotation to 60 degrees in order to improve community safety    Time  6    Period  Weeks    Status  New    Target Date  04/11/20      PT LONG TERM GOAL #3   Title  Patient will increase pain free cervical flexion and extension by 20 degrees in order to use a computer without pain    Time  6    Period  Weeks  Status  New    Target Date  04/11/20            Plan - 04/22/20 1202    Clinical Impression Statement  Therapy continues to focus on left arm strengthening. She continues to have spasming of the left upper trap but it has improved. She has limited tolerance to manual therapy. Therapy demonstrated how to perfrom self manual therapy at home.  therapy will continue to advance home exercises as tolerated.    Personal Factors and Comorbidities  Comorbidity 1;Comorbidity 2;Comorbidity 3+    Comorbidities  asthma, lumbar fusion, fibromyalgia, depression    Examination-Activity Limitations  Bed Mobility;Locomotion Level;Lift;Reach Overhead;Carry    Examination-Participation Restrictions  Laundry;Cleaning;Community Activity;Shop    Stability/Clinical Decision Making  Evolving/Moderate complexity    Clinical Decision Making  Moderate    Rehab Potential  Good    PT Frequency  1x / week    PT Duration  6 weeks    PT Treatment/Interventions  ADLs/Self Care Home Management;Electrical Stimulation;Iontophoresis 4mg /ml Dexamethasone;Moist Heat;Functional mobility training;Therapeutic activities;Therapeutic exercise;Neuromuscular re-education;Patient/family education;Manual techniques;Passive range of motion;Taping;Dry needling    PT Next Visit Plan  re-assess. Continue with strengthening    PT Home Exercise Plan  reviewed relaxation techniques, cervical rotation 3x in pain free range and sefl soft tissue mobilization to the upper trap    Consulted and Agree with Plan of Care  Patient       Patient will benefit from skilled therapeutic intervention in order to improve the following deficits and impairments:  Decreased range of motion, Increased fascial restricitons, Increased muscle spasms, Impaired UE functional use, Decreased activity tolerance, Pain, Decreased strength, Improper body mechanics, Postural dysfunction  Visit Diagnosis: Radiculopathy, cervical region  Other muscle spasm  Cervical spondylosis with myelopathy     Problem List Patient Active Problem List   Diagnosis Date Noted  . Leg cramping 08/12/2019  . Hypophosphatemia 01/07/2019  . Vertigo 09/23/2018  . Dysfunction of left eustachian tube 09/23/2018  . Gait disorder 04/21/2018  . Leukocytosis 04/15/2018  . Nausea & vomiting 04/15/2018  . Seizure ([place])   .  Cervical radiculopathy 02/18/2018  . 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 Syncope 09/04/2017  . Constipation 09/04/2017  . Nonintractable headache 09/04/2017  . Leg swelling 07/26/2017  . 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 Hyperlipidemia 04/20/2017  . Pedal edema 04/19/2017  . Bipolar I disorder ([place]) 09/19/2016  . Facial pain 07/12/2016  . Rash 06/14/2016  . Migraine 01/25/2016  . Idiopathic urticaria/pruritus 01/23/2016  . Perennial allergic rhinitis 01/23/2016  . Oral candidiasis 01/23/2016  . Greater trochanteric bursitis of both hips 09/08/2015  . Chest pain 06/22/2015  . Itching 06/22/2015  . Bilateral knee pain 06/22/2015  . Bilateral hip pain 06/22/2015  . Anemia, iron deficiency 12/22/2014  . Fatigue 12/21/2014  . Intertrigo 08/07/2014  . Dyspareunia 07/12/2014  . Menopausal syndrome (hot flushes) 07/12/2014  . History of tobacco abuse 07/12/2014  . Dizziness 06/22/2014  . Weakness 06/22/2014  . Fibromyalgia 05/14/2014  . Preventative health care 04/22/2014  . Recurrent boils 04/22/2014  . Recurrent falls 04/22/2014  . Peripheral vascular disease ([place])   . Depression   . Anxiety   . GERD (gastroesophageal reflux disease)   . Prediabetes 02/23/2014  . Screening mammogram for high-risk patient 02/23/2014  . Back pain 07/22/2013  . COPD/asthma 08/24/2009  . Headache(784.0) 08/24/2009  . HTN (hypertension) 05/12/2009    Carney Living PT DPT  04/22/2020, 12:05 PM  [facility] [address]  [place], [place], [address] Phone: [phone]   Fax:  [phone]  Name: [name] MRN: [id number] Date of Birth: 05-11-1961

## 2020-04-25 ENCOUNTER — Other Ambulatory Visit: Payer: Self-pay | Admitting: Allergy & Immunology

## 2020-04-26 ENCOUNTER — Ambulatory Visit: Payer: Medicare HMO | Admitting: Physical Therapy

## 2020-04-27 ENCOUNTER — Encounter: Payer: Self-pay | Admitting: Physical Therapy

## 2020-04-27 ENCOUNTER — Ambulatory Visit: Payer: Medicare HMO | Admitting: Physical Therapy

## 2020-04-27 ENCOUNTER — Other Ambulatory Visit: Payer: Self-pay

## 2020-04-27 DIAGNOSIS — M62838 Other muscle spasm: Secondary | ICD-10-CM

## 2020-04-27 DIAGNOSIS — M5412 Radiculopathy, cervical region: Secondary | ICD-10-CM

## 2020-04-27 DIAGNOSIS — M4712 Other spondylosis with myelopathy, cervical region: Secondary | ICD-10-CM

## 2020-04-28 ENCOUNTER — Ambulatory Visit (INDEPENDENT_AMBULATORY_CARE_PROVIDER_SITE_OTHER): Payer: Medicare HMO

## 2020-04-28 ENCOUNTER — Encounter: Payer: Self-pay | Admitting: Physical Therapy

## 2020-04-28 DIAGNOSIS — J4540 Moderate persistent asthma, uncomplicated: Secondary | ICD-10-CM

## 2020-04-28 NOTE — Therapy (Signed)
[place], [place], [address] Phone: [phone]   Fax:  [phone]  Physical Therapy Treatment  Patient Details  Name: [name] MRN: [id number] Date of Birth: [birth year] Referring Provider (PT): Dr Frederich Cha   Encounter Date: 04/27/2020  PT End of Session - 04/28/20 1108    Visit Number  11    Number of Visits  15    Date for PT Re-Evaluation  05/18/20    Authorization Type  [employer]    PT Start Time  [id number]    PT Stop Time  1225    PT Time Calculation (min)  40 min    Activity Tolerance  Patient tolerated treatment well    Behavior During Therapy  [facility] for tasks assessed/performed       Past Medical History:  Diagnosis Date  . ALLERGIC RHINITIS 05/12/2009   Qualifier: Diagnosis of  By: Lenn Cal Deborra Medina), Wynona Canes    . Anemia, iron deficiency 12/22/2014  . Angina   . Anxiety   . Arthritis   . Asthma   . ASTHMA 05/12/2009   Severe AFL (Spirometry 05/2009: pre-BD FEV1 0.87L 34% pred, post-BD FEV1 1.11L 44% pred) Volumes hyperinflated Decreased DLCO that does not fully correct to normal range for alveolar volume.     Marland Kitchen COPD 08/24/2009   Qualifier: Diagnosis of  By: Burnett Kanaris    . COPD (chronic obstructive pulmonary disease) ([place])   . Depression   . Fibromyalgia 05/14/2014  . GERD (gastroesophageal reflux disease)   . Hyperlipidemia 04/20/2017  . HYPERTENSION 05/12/2009   Qualifier: Diagnosis of  By: Lenn Cal Deborra Medina), Wynona Canes    . Migraine   . Nephrolithiasis   . Peripheral vascular disease ([place])   . Prediabetes 02/23/2014  . Seizure ([place])   . Seizure ([place])   . Urticaria     Past Surgical History:  Procedure Laterality Date  . ABDOMINAL HYSTERECTOMY    . BACK SURGERY    . COLONOSCOPY  12/20/2011   Procedure: COLONOSCOPY;  Surgeon: Landry Dyke, MD;  Location: [facility];  Service: Endoscopy;  Laterality: N/A;  . COLONOSCOPY  03/05/2012   Procedure: COLONOSCOPY;  Surgeon:  Landry Dyke, MD;  Location: [facility];  Service: Endoscopy;  Laterality: N/A;  . DIAGNOSTIC LAPAROSCOPY    . HEMORRHOID SURGERY    . INCISE AND DRAIN ABCESS    . KIDNEY STONE SURGERY    . NECK SURGERY     x 2 Dr Orinda Kenner  . SPINE SURGERY  2019  . TOE SURGERY    . TUBAL LIGATION      There were no vitals filed for this visit.  Subjective Assessment - 04/27/20 1148    Subjective  Patient reports her back is better. She is having very little pain in her neck and shoulder. She has been moving so she has been lifting a lot of boxes.    Pertinent History  Lumbar spine  surgery (unspeicifeid in the chart), Asthma, Fibromyalgia, Seizures,    Currently in Pain?  No/denies                        [facility] Adult PT Treatment/Exercise - 04/28/20 0001      Self-Care   Other Self-Care Comments   reviewed lifting technuqye for boxes. Talked to paitient about using her legs and keeping the load close to her.,       Neck Exercises: Standing   Other Standing Exercises  standing shoulder extension 3x10 redscap retraction red 3x10       Neck Exercises: Seated   Other Seated Exercise  seated bilateral er red 2x10 ; horizontal abduction 2x10     Other Seated Exercise  wand flexion 2x10 1lb ; UE ranger       Manual Therapy   Soft tissue mobilization  improved tolerance to light soft tissue mobilization     Manual Traction  genlte manual traction to cervical spine      Neck Exercises: Stretches   Upper Trapezius Stretch  20 seconds;3 reps    Levator Stretch  3 reps;20 seconds    Other Neck Stretches  reveiwed LTR for total spine mobility as well as hamstring stretching to help patient bend over better. She has had some pain lenaing to clean              PT Education - 04/28/20 1103    Education Details  viewed HEp and symptom mangement    Person(s) Educated  Patient    Methods  Explanation;Demonstration;Tactile cues;Verbal cues    Comprehension  Verbalized  understanding;Returned demonstration;Verbal cues required;Tactile cues required;Need further instruction       PT Short Term Goals - 04/28/20 1114      PT SHORT TERM GOAL #1   Title  Patient will increase cervical extension by 10 degrees    Time  3    Period  Weeks    Status  On-going    Target Date  03/21/20      PT SHORT TERM GOAL #2   Title  Patient will increase bilateral cervical rotation by 20 degrees bilateral    Baseline  58 right 70 left    Time  3    Period  Weeks    Status  On-going    Target Date  03/21/20      PT SHORT TERM GOAL #3   Title  Patient will increase gross left grip strength to 20 lbs    Baseline  improved to 20    Time  3    Period  Weeks    Status  On-going    Target Date  03/21/20        PT Long Term Goals - 02/29/20 0946      PT LONG TERM GOAL #1   Title  Patient will report no radicular pain down her left arm whilre carrying grocery bags    Time  6    Period  Weeks    Status  New    Target Date  04/11/20      PT LONG TERM GOAL #2   Title  Patient will increase bilateral cervical rotation to 60 degrees in order to improve community safety    Time  6    Period  Weeks    Status  New    Target Date  04/11/20      PT LONG TERM GOAL #3   Title  Patient will increase pain free cervical flexion and extension by 20 degrees in order to use a computer without pain    Time  6    Period  Weeks    Status  New    Target Date  04/11/20            Plan - 04/28/20 1111    Clinical Impression Statement  Therapy reviewed lifting technique with patient. She will be moving over the next few days. She continues to tolerate therapy well. She tolerated manual therapy  better this visit. She is reaching max benefit from therapy. he back has improved as well. Likely D/C next visit.    Personal Factors and Comorbidities  Comorbidity 1;Comorbidity 2;Comorbidity 3+    Comorbidities  asthma, lumbar fusion, fibromyalgia, depression     Examination-Activity Limitations  Bed Mobility;Locomotion Level;Lift;Reach Overhead;Carry    Examination-Participation Restrictions  Laundry;Cleaning;Community Activity;Shop    Stability/Clinical Decision Making  Evolving/Moderate complexity    Clinical Decision Making  Moderate    Rehab Potential  Good    PT Frequency  1x / week    PT Duration  6 weeks    PT Treatment/Interventions  ADLs/Self Care Home Management;Electrical Stimulation;Iontophoresis 4mg /ml Dexamethasone;Moist Heat;Functional mobility training;Therapeutic activities;Therapeutic exercise;Neuromuscular re-education;Patient/family education;Manual techniques;Passive range of motion;Taping;Dry needling    PT Next Visit Plan  re-assess. Continue with strengthening    PT Home Exercise Plan  reviewed relaxation techniques, cervical rotation 3x in pain free range and sefl soft tissue mobilization to the upper trap    Consulted and Agree with Plan of Care  Patient       Patient will benefit from skilled therapeutic intervention in order to improve the following deficits and impairments:  Decreased range of motion, Increased fascial restricitons, Increased muscle spasms, Impaired UE functional use, Decreased activity tolerance, Pain, Decreased strength, Improper body mechanics, Postural dysfunction  Visit Diagnosis: Radiculopathy, cervical region  Other muscle spasm  Cervical spondylosis with myelopathy     Problem List Patient Active Problem List   Diagnosis Date Noted  . Leg cramping 08/12/2019  . Hypophosphatemia 01/07/2019  . Vertigo 09/23/2018  . Dysfunction of left eustachian tube 09/23/2018  . Gait disorder 04/21/2018  . Leukocytosis 04/15/2018  . Nausea & vomiting 04/15/2018  . Seizure ([place])   . Cervical radiculopathy 02/18/2018  . 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 Syncope 09/04/2017  . Constipation 09/04/2017  . Nonintractable headache 09/04/2017  . Leg swelling 07/26/2017  . 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 Hyperlipidemia 04/20/2017  . Pedal edema 04/19/2017  . Bipolar I disorder ([place]) 09/19/2016  . Facial pain 07/12/2016  . Rash 06/14/2016  . Migraine 01/25/2016  . Idiopathic urticaria/pruritus 01/23/2016  . Perennial allergic rhinitis 01/23/2016  . Oral candidiasis 01/23/2016  . Greater trochanteric bursitis of both hips 09/08/2015  . Chest pain 06/22/2015  . Itching 06/22/2015  . Bilateral knee pain 06/22/2015  . Bilateral hip pain 06/22/2015  . Anemia, iron deficiency 12/22/2014  . Fatigue 12/21/2014  . Intertrigo 08/07/2014  . Dyspareunia 07/12/2014  . Menopausal syndrome (hot flushes) 07/12/2014  . History of tobacco abuse 07/12/2014  . Dizziness 06/22/2014  . Weakness 06/22/2014  . Fibromyalgia 05/14/2014  . Preventative health care 04/22/2014  . Recurrent boils 04/22/2014  . Recurrent falls 04/22/2014  . Peripheral vascular disease ([place])   . Depression   . Anxiety   . GERD (gastroesophageal reflux disease)   . Prediabetes 02/23/2014  . Screening mammogram for high-risk patient 02/23/2014  . Back pain 07/22/2013  . COPD/asthma 08/24/2009  . Headache(784.0) 08/24/2009  . HTN (hypertension) 05/12/2009    Carney Living  PT DPT  04/28/2020, 11:16 AM  [facility] [address] [place], [place], [address] Phone: [phone]   Fax:  [phone]  Name: [name] MRN: [id number] Date of Birth: Oct 06, 1961

## 2020-04-29 ENCOUNTER — Encounter: Payer: Medicare HMO | Admitting: Obstetrics & Gynecology

## 2020-05-02 ENCOUNTER — Other Ambulatory Visit: Payer: Self-pay

## 2020-05-02 ENCOUNTER — Ambulatory Visit: Payer: Medicare HMO | Admitting: Physical Therapy

## 2020-05-02 ENCOUNTER — Encounter: Payer: Self-pay | Admitting: Physical Therapy

## 2020-05-02 DIAGNOSIS — M62838 Other muscle spasm: Secondary | ICD-10-CM | POA: Diagnosis not present

## 2020-05-02 DIAGNOSIS — M5412 Radiculopathy, cervical region: Secondary | ICD-10-CM | POA: Diagnosis not present

## 2020-05-02 DIAGNOSIS — M4712 Other spondylosis with myelopathy, cervical region: Secondary | ICD-10-CM

## 2020-05-02 NOTE — Therapy (Signed)
[place], [place], [address] Phone: [phone]   Fax:  [phone]  Physical Therapy Treatment/Discharge   Patient Details  Name: [name] MRN: [id number] Date of Birth: [birth year] Referring Provider (PT): Dr Frederich Cha   Encounter Date: 05/02/2020  PT End of Session - 05/02/20 0856    Visit Number  12    Number of Visits  15    Date for PT Re-Evaluation  05/18/20    Authorization Type  [employer]    PT Start Time  0845    PT Stop Time  0923    PT Time Calculation (min)  38 min    Activity Tolerance  Patient tolerated treatment well    Behavior During Therapy  [facility] for tasks assessed/performed       Past Medical History:  Diagnosis Date  . ALLERGIC RHINITIS 05/12/2009   Qualifier: Diagnosis of  By: Lenn Cal Deborra Medina), Wynona Canes    . Anemia, iron deficiency 12/22/2014  . Angina   . Anxiety   . Arthritis   . Asthma   . ASTHMA 05/12/2009   Severe AFL (Spirometry 05/2009: pre-BD FEV1 0.87L 34% pred, post-BD FEV1 1.11L 44% pred) Volumes hyperinflated Decreased DLCO that does not fully correct to normal range for alveolar volume.     Marland Kitchen COPD 08/24/2009   Qualifier: Diagnosis of  By: Burnett Kanaris    . COPD (chronic obstructive pulmonary disease) ([place])   . Depression   . Fibromyalgia 05/14/2014  . GERD (gastroesophageal reflux disease)   . Hyperlipidemia 04/20/2017  . HYPERTENSION 05/12/2009   Qualifier: Diagnosis of  By: Lenn Cal Deborra Medina), Wynona Canes    . Migraine   . Nephrolithiasis   . Peripheral vascular disease ([place])   . Prediabetes 02/23/2014  . Seizure ([place])   . Seizure ([place])   . Urticaria     Past Surgical History:  Procedure Laterality Date  . ABDOMINAL HYSTERECTOMY    . BACK SURGERY    . COLONOSCOPY  12/20/2011   Procedure: COLONOSCOPY;  Surgeon: Landry Dyke, MD;  Location: [facility];  Service: Endoscopy;  Laterality: N/A;  . COLONOSCOPY  03/05/2012   Procedure: COLONOSCOPY;   Surgeon: Landry Dyke, MD;  Location: [facility];  Service: Endoscopy;  Laterality: N/A;  . DIAGNOSTIC LAPAROSCOPY    . HEMORRHOID SURGERY    . INCISE AND DRAIN ABCESS    . KIDNEY STONE SURGERY    . NECK SURGERY     x 2 Dr Orinda Kenner  . SPINE SURGERY  2019  . TOE SURGERY    . TUBAL LIGATION      There were no vitals filed for this visit.  Subjective Assessment - 05/02/20 0848    Subjective  Patient reports her neck and shoulder have been feeling good. She is not having much pain at all. She has mild pain whren she reaches overhead or turns her head to the left.    Pertinent History  Lumbar spine  surgery (unspeicifeid in the chart), Asthma, Fibromyalgia, Seizures,    Diagnostic tests  Had an MRI but not in the chart    Patient Stated Goals  to aviod surgery and pain mediciations    Currently in Pain?  No/denies         [facility] PT Assessment - 05/02/20 0001      Assessment   Medical Diagnosis  Left Cervical Radiculopathy     Referring Provider (PT)  Dr Frederich Cha      Observation/Other  Assessments   Focus on Therapeutic Outcomes (FOTO)   39%       AROM   Overall AROM Comments  left active shoulder 160     Cervical - Right Rotation  70    Cervical - Left Rotation  65      Strength   Left Shoulder Flexion  4+/5    Left Shoulder Internal Rotation  5/5    Left Shoulder External Rotation  5/5      Palpation   Palpation comment  continuesd spasming in upper trap and levator but improved. Patient has low tolerance to light touch but dose better with self ssoft tissue mobilization. She was shown where to buy a thera-cane.                     [facility] Adult PT Treatment/Exercise - 05/02/20 0001      Self-Care   Other Self-Care Comments   reviewed lifting technique, how to use her program going forward, trigger point release with thera-cane and where to purchase it.       Neck Exercises: Standing   Other Standing Exercises  standing shoulder extension 3x10  redscap retraction red 3x10       Neck Exercises: Seated   Other Seated Exercise  seated bilateral er red 2x10 ; horizontal abduction 2x10     Other Seated Exercise  wand flexion 2x10 1lb ; UE ranger       Manual Therapy   Soft tissue mobilization  improved tolerance to light soft tissue mobilization     Manual Traction  genlte manual traction to cervical spine      Neck Exercises: Stretches   Upper Trapezius Stretch  20 seconds;3 reps    Levator Stretch  3 reps;20 seconds             PT Education - 05/02/20 1229    Education Details  reviewed final HEP in perperation for discharge    Person(s) Educated  Patient    Methods  Explanation;Demonstration;Tactile cues;Verbal cues    Comprehension  Verbalized understanding;Returned demonstration;Verbal cues required;Tactile cues required       PT Short Term Goals - 05/02/20 1231      PT SHORT TERM GOAL #1   Title  Patient will increase cervical extension by 10 degrees    Baseline  full    Time  3    Period  Weeks    Status  Achieved      PT SHORT TERM GOAL #2   Title  Patient will increase bilateral cervical rotation by 20 degrees bilateral    Baseline  65 left 75 right    Time  3    Period  Weeks    Status  Achieved    Target Date  03/21/20      PT SHORT TERM GOAL #3   Title  Patient will increase gross left grip strength to 20 lbs    Baseline  improved to 20    Time  3    Period  Weeks    Status  Achieved    Target Date  03/21/20        PT Long Term Goals - 05/02/20 1232      PT LONG TERM GOAL #1   Title  Patient will report no radicular pain down her left arm whilre carrying grocery bags    Baseline  has not had radicular pain in several weeks    Time  6    Period  Weeks    Status  On-going      PT LONG TERM GOAL #2   Title  Patient will increase bilateral cervical rotation to 60 degrees in order to improve community safety    Time  6    Period  Weeks    Status  Achieved      PT LONG TERM GOAL #3    Title  Patient will increase pain free cervical flexion and extension by 20 degrees in order to use a computer without pain    Time  6    Period  Weeks    Status  Achieved            Plan - 05/02/20 0911    Clinical Impression Statement  Patient has reached goals for therapy. She continues to have a trigger point in her upper trap/levator area but she has a program of stretching and self soft tissue mobilization. Her strength and left shoulder ROm have mproved significantly. therapy reviewed program with the patient. she feels comfortable with her program and progression of activity.    Personal Factors and Comorbidities  Comorbidity 1;Comorbidity 2;Comorbidity 3+    Comorbidities  asthma, lumbar fusion, fibromyalgia, depression    Examination-Participation Restrictions  Laundry;Cleaning;Community Activity;Shop    Stability/Clinical Decision Making  Evolving/Moderate complexity    Clinical Decision Making  Moderate    Rehab Potential  Good    PT Frequency  1x / week    PT Duration  6 weeks    PT Treatment/Interventions  ADLs/Self Care Home Management;Electrical Stimulation;Iontophoresis 43m/ml Dexamethasone;Moist Heat;Functional mobility training;Therapeutic activities;Therapeutic exercise;Neuromuscular re-education;Patient/family education;Manual techniques;Passive range of motion;Taping;Dry needling    PT Next Visit Plan  re-assess. Continue with strengthening    PT Home Exercise Plan  reviewed relaxation techniques, cervical rotation 3x in pain free range and sefl soft tissue mobilization to the upper trap    Consulted and Agree with Plan of Care  Patient       Patient will benefit from skilled therapeutic intervention in order to improve the following deficits and impairments:  Decreased range of motion, Increased fascial restricitons, Increased muscle spasms, Impaired UE functional use, Decreased activity tolerance, Pain, Decreased strength, Improper body mechanics, Postural  dysfunction  Visit Diagnosis: Radiculopathy, cervical region  Other muscle spasm  Cervical spondylosis with myelopathy    PHYSICAL THERAPY DISCHARGE SUMMARY  Visits from Start of Care: 12  Current functional level related to goals / functional outcomes: Improved pain with use of shoulder and cervical movement   Remaining deficit Continues to have minor spasming of the upper trap    Education / Equipment: HEP/ symptom management   Plan: Patient agrees to discharge.  Patient goals were met. Patient is being discharged due to meeting the stated rehab goals.  ?????       Problem List Patient Active Problem List   Diagnosis Date Noted  . Leg cramping 08/12/2019  . Hypophosphatemia 01/07/2019  . Vertigo 09/23/2018  . Dysfunction of left eustachian tube 09/23/2018  . Gait disorder 04/21/2018  . Leukocytosis 04/15/2018  . Nausea & vomiting 04/15/2018  . [place] ([place]   . Cervical radiculopathy 02/18/2018  . [place] ([place] 10/09/2017  . Syncope 09/04/2017  . Constipation 09/04/2017  . Nonintractable headache 09/04/2017  . Leg swelling 07/26/2017  . 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 Hyperlipidemia 04/20/2017  . Pedal edema 04/19/2017  . Bipolar I disorder ([place] 09/19/2016  . Facial pain 07/12/2016  . Rash 06/14/2016  . Migraine 01/25/2016  .  Idiopathic urticaria/pruritus 01/23/2016  . Perennial allergic rhinitis 01/23/2016  . Oral candidiasis 01/23/2016  . Greater trochanteric bursitis of both hips 09/08/2015  . Chest pain 06/22/2015  . Itching 06/22/2015  . Bilateral knee pain 06/22/2015  . Bilateral hip pain 06/22/2015  . Anemia, iron deficiency 12/22/2014  . Fatigue 12/21/2014  . Intertrigo 08/07/2014  . Dyspareunia 07/12/2014  . Menopausal syndrome (hot flushes) 07/12/2014  . History of tobacco abuse 07/12/2014  . Dizziness 06/22/2014  . Weakness 06/22/2014  . Fibromyalgia 05/14/2014  . Preventative health care 04/22/2014  . Recurrent  boils 04/22/2014  . Recurrent falls 04/22/2014  . Peripheral vascular disease ([place])   . Depression   . Anxiety   . GERD (gastroesophageal reflux disease)   . Prediabetes 02/23/2014  . Screening mammogram for high-risk patient 02/23/2014  . Back pain 07/22/2013  . COPD/asthma 08/24/2009  . Headache(784.0) 08/24/2009  . HTN (hypertension) 05/12/2009    Carney Living PT DPT  05/02/2020, 12:37 PM  [place] [facility] [address] [place], [place], [address] Phone: [phone]   Fax:  [phone]  Name: [name] MRN: [id number] Date of Birth: 11/10/1961

## 2020-05-04 ENCOUNTER — Other Ambulatory Visit: Payer: Self-pay

## 2020-05-05 ENCOUNTER — Encounter: Payer: Medicare HMO | Admitting: Obstetrics & Gynecology

## 2020-05-12 ENCOUNTER — Encounter: Payer: Self-pay | Admitting: Allergy & Immunology

## 2020-05-12 ENCOUNTER — Ambulatory Visit (INDEPENDENT_AMBULATORY_CARE_PROVIDER_SITE_OTHER): Payer: Medicare HMO | Admitting: Allergy & Immunology

## 2020-05-12 ENCOUNTER — Ambulatory Visit (INDEPENDENT_AMBULATORY_CARE_PROVIDER_SITE_OTHER): Payer: Medicare HMO

## 2020-05-12 ENCOUNTER — Other Ambulatory Visit: Payer: Self-pay

## 2020-05-12 VITALS — BP 126/82 | HR 75 | Temp 98.2°F | Resp 16 | Wt 156.2 lb

## 2020-05-12 DIAGNOSIS — J449 Chronic obstructive pulmonary disease, unspecified: Secondary | ICD-10-CM | POA: Diagnosis not present

## 2020-05-12 DIAGNOSIS — J3089 Other allergic rhinitis: Secondary | ICD-10-CM

## 2020-05-12 DIAGNOSIS — L508 Other urticaria: Secondary | ICD-10-CM

## 2020-05-12 DIAGNOSIS — J4540 Moderate persistent asthma, uncomplicated: Secondary | ICD-10-CM

## 2020-05-12 MED ORDER — FLUTICASONE PROPIONATE 50 MCG/ACT NA SUSP: 1.0000 | Freq: Two times a day (BID) | NASAL | 5 refills | Status: DC

## 2020-05-12 MED ORDER — FAMOTIDINE 20 MG PO TABS: 20.0000 mg | ORAL_TABLET | Freq: Two times a day (BID) | ORAL | 5 refills | Status: DC

## 2020-05-12 MED ORDER — BREZTRI AEROSPHERE 160-9-4.8 MCG/ACT IN AERO: 2.0000 | INHALATION_SPRAY | Freq: Two times a day (BID) | RESPIRATORY_TRACT | 5 refills | Status: DC

## 2020-05-12 MED ORDER — ALBUTEROL SULFATE HFA 108 (90 BASE) MCG/ACT IN AERS: 4.0000 | INHALATION_SPRAY | RESPIRATORY_TRACT | 2 refills | Status: DC | PRN

## 2020-05-12 MED ORDER — LEVOCETIRIZINE DIHYDROCHLORIDE 5 MG PO TABS: 5.0000 mg | ORAL_TABLET | Freq: Two times a day (BID) | ORAL | 5 refills | Status: DC

## 2020-05-12 NOTE — Progress Notes (Signed)
FOLLOW UP  Date of Service/Encounter:  05/12/20   Assessment:   Asthma-COPD overlap syndrome  Perennial allergic rhinitis  Chronic urticaria   Plan/Recommendations:   1. Asthma-COPD overlap syndrome - Lung testing looked fairly stable today. - We will avoid steroids today.  - We will start you on Breztri today (Symbicort + another another medication to help keep your lungs open). - Daily controller medication(s): Breztri 11mcg two puffs twice daily with spacer + Dupixent 300 mg every two weeks - Prior to physical activity: ProAir 2 puffs 10-15 minutes before physical activity. - Rescue medications: ProAir 4 puffs every 4-6 hours as needed or albuterol nebulizer one vial puffs every 4-6 hours as needed - Asthma control goals:  * Full participation in all desired activities (may need albuterol before activity) * Albuterol use two time or less a week on average (not counting use with activity) * Cough interfering with sleep two time or less a month * Oral steroids no more than once a year * No hospitalizations   2. Chronic urticaria - Continue with Xyzal twice daily. - Continue with famotidine twice daily.  3. Allergic rhinitis with ear fullness - Continue with Flonase 1 spray twice daily. - We are going to get some labs to look for environmental allergies. - We will call you in 1-2 weeks with the results of the testing.  - Consider allergy shots in the future if needed.   4. Return in about 3 months (around 08/12/2020). This can be an in-person, a virtual Webex or a telephone follow up visit.  Subjective:   [name] is a 59 y.o. 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   Syncope 09/04/2017   Constipation 09/04/2017   Nonintractable headache 09/04/2017   Leg swelling 07/26/2017   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   Hyperlipidemia 04/20/2017   Pedal edema 04/19/2017   Bipolar I disorder ([place]) 09/19/2016   Facial pain 07/12/2016   Rash 06/14/2016   Migraine 01/25/2016   Idiopathic urticaria/pruritus 01/23/2016   Perennial allergic rhinitis 01/23/2016   Oral candidiasis 01/23/2016   Greater trochanteric bursitis of both hips 09/08/2015   Chest pain 06/22/2015   Itching 06/22/2015   Bilateral knee pain 06/22/2015   Bilateral hip pain 06/22/2015   Anemia, iron deficiency 12/22/2014   Fatigue 12/21/2014   Intertrigo 08/07/2014   Dyspareunia 07/12/2014   Menopausal syndrome (hot flushes) 07/12/2014   History of tobacco abuse 07/12/2014   Dizziness 06/22/2014   Weakness 06/22/2014   Fibromyalgia 05/14/2014   Preventative health care 04/22/2014   Recurrent boils 04/22/2014   Recurrent falls 04/22/2014   Peripheral vascular disease ([place])    Depression    Anxiety    GERD (gastroesophageal reflux disease)    Prediabetes 02/23/2014   Screening mammogram for high-risk patient 02/23/2014   Back pain 07/22/2013   COPD/asthma 08/24/2009   Headache(784.0) 08/24/2009   HTN (hypertension) 05/12/2009    History obtained from: chart review and  patient.   Saima Pescador is a 59 y.o. female presenting for a follow up visit.  He was last seen in September 2020.  At that time, we diagnosed her with an asthma exacerbation and started her on a prednisone Dosepak.  We also gave her a Depo-Medrol injection.  We continued her Dupixent every 2 weeks for chronic urticaria, which were also hoping  would help with her asthma.  We continued her on Flonase 1 spray per nostril twice daily as well as Xyzal twice daily.  Since the last visit, she has continued to have issues.   Asthma/Respiratory Symptom History: She did receive two courses of prednisone from Dr. Lamonte Sakai since the last visit. She received on taper in April 2021 and March 2021. She does feel that the addition of the Dupixent helped with her breathing. She thinks that a lot of this is from her apartment she is living in, but she is moving to a different apartment on the first floor soon. She is hopeful that this will help with her symptoms.   Allergic Rhinitis Symptom History: She remains on Flonase one spray per nostril twice daily as well as Xyzal, although she does not remember the name of the antihistamine. She does feel that she has been sneezing more than usual. She has not been needing an antibiotic at all.   Otherwise, there have been no changes to her past medical history, surgical history, family history, or social history.     Review of Systems  Constitutional: Negative.  Negative for chills, fever, malaise/fatigue and weight loss.  HENT: Negative.  Negative for congestion, ear discharge, ear pain, sinus pain and sore throat.   Eyes: Negative for pain, discharge and redness.  Respiratory: Negative for cough, sputum production, shortness of breath and wheezing.   Cardiovascular: Negative.  Negative for chest pain and palpitations.  Gastrointestinal: Negative for abdominal pain, constipation, diarrhea, heartburn, nausea and vomiting.  Skin: Negative.  Negative for itching and rash.  Neurological: Negative for dizziness and headaches.  Endo/Heme/Allergies: Negative for environmental allergies. Does not bruise/bleed easily.       Objective:   Blood pressure 126/82, pulse 75, temperature 98.2 F (36.8 C), temperature source Temporal, resp. rate 16, weight 156 lb 3.2 oz (70.9 kg), SpO2 96 %. Body mass index is  27.67 kg/m.   Physical Exam:  Physical Exam  Constitutional: She appears well-developed.  Pleasant, talkative.  HENT:  Head: Normocephalic and atraumatic.  Right Ear: Tympanic membrane, external ear and ear canal normal.  Left Ear: Tympanic membrane, external ear and ear canal normal.  Nose: Mucosal edema and rhinorrhea present. No nasal deformity or septal deviation. No epistaxis. Right sinus exhibits no maxillary sinus tenderness and no frontal sinus tenderness. Left sinus exhibits no maxillary sinus tenderness and no frontal sinus tenderness.  Mouth/Throat: Uvula is midline and oropharynx is clear and moist. Mucous membranes are not pale and not dry.  Turbinates enlarged bilaterally.   Eyes: Pupils are equal, round, and reactive to light. Conjunctivae and EOM are normal. Right eye exhibits no chemosis and no discharge. Left eye exhibits no chemosis and no discharge. Right conjunctiva is not injected. Left conjunctiva is not injected.  Cardiovascular: Normal rate, regular rhythm and normal heart sounds.  Respiratory: Effort normal and breath sounds normal. No accessory muscle usage. No tachypnea. No respiratory distress. She has no wheezes. She has no rhonchi. She has no rales. She exhibits no tenderness.  Moving air well in all lung fields. No increased work of  breathing noted.   Lymphadenopathy:    She has no cervical adenopathy.  Neurological: She is alert.  Skin: No abrasion, no petechiae and no rash noted. Rash is not papular, not vesicular and not urticarial. No erythema. No pallor.  No eczematous or urticarial lesions noted.   Psychiatric: She has a normal mood and affect.     Diagnostic studies:    Spirometry:        Allergy Studies: none      Salvatore Marvel, MD  [facility]

## 2020-05-12 NOTE — Patient Instructions (Addendum)
1. Asthma-COPD overlap syndrome - Lung testing looked fairly stable today. - We will avoid steroids today.  - We will start you on Breztri today (Symbicort + another another medication to help keep your lungs open). - Daily controller medication(s): Breztri 161mcg two puffs twice daily with spacer + Dupixent 300 mg every two weeks - Prior to physical activity: ProAir 2 puffs 10-15 minutes before physical activity. - Rescue medications: ProAir 4 puffs every 4-6 hours as needed or albuterol nebulizer one vial puffs every 4-6 hours as needed - Asthma control goals:  * Full participation in all desired activities (may need albuterol before activity) * Albuterol use two time or less a week on average (not counting use with activity) * Cough interfering with sleep two time or less a month * Oral steroids no more than once a year * No hospitalizations   2. Chronic urticaria - Continue with Xyzal twice daily. - Continue with famotidine twice daily.  3. Allergic rhinitis with ear fullness - Continue with Flonase 1 spray twice daily. - We are going to get some labs to look for environmental allergies. - We will call you in 1-2 weeks with the results of the testing.  - Consider allergy shots in the future if needed.   4. Return in about 3 months (around 08/12/2020). This can be an in-person, a virtual Webex or a telephone follow up visit.   Please inform us of any Emergency Department visits, hospitalizations, or changes in symptoms. Call us before going to the ED for breathing or allergy symptoms since we might be able to fit you in for a sick visit. Feel free to contact us anytime with any questions, problems, or concerns.  It was a pleasure to see you again today!  Websites that have reliable patient information: 1. American Academy of Asthma, Allergy, and Immunology: www.aaaai.org 2. Food Allergy Research and Education (FARE): foodallergy.org 3. Mothers of Asthmatics:  http://www.asthmacommunitynetwork.org 4. American College of Allergy, Asthma, and Immunology: www.acaai.org   COVID-19 Vaccine Information can be found at: ShippingScam.co.uk For questions related to vaccine distribution or appointments, please email [email or web address] or call [phone].     "Like" [place] on Facebook and Instagram for our latest updates!       HAPPY SPRING!  Make sure you are registered to vote! If you have moved or changed any of your contact information, you will need to get this updated before voting!  In some cases, you MAY be able to register to vote online: [email or web address]

## 2020-05-17 DIAGNOSIS — M4712 Other spondylosis with myelopathy, cervical region: Secondary | ICD-10-CM | POA: Diagnosis not present

## 2020-05-17 DIAGNOSIS — M542 Cervicalgia: Secondary | ICD-10-CM | POA: Diagnosis not present

## 2020-05-17 DIAGNOSIS — M5412 Radiculopathy, cervical region: Secondary | ICD-10-CM | POA: Diagnosis not present

## 2020-05-17 DIAGNOSIS — M4302 Spondylolysis, cervical region: Secondary | ICD-10-CM | POA: Diagnosis not present

## 2020-05-17 DIAGNOSIS — I10 Essential (primary) hypertension: Secondary | ICD-10-CM | POA: Insufficient documentation

## 2020-05-17 LAB — IGE+ALLERGENS ZONE 2(30)

## 2020-05-17 LAB — ALLERGEN PROFILE, MOLD
Aureobasidi Pullulans IgE: <0.1 kU/L
Candida Albicans IgE: <0.1 kU/L
M009-IgE Fusarium proliferatum: <0.1 kU/L
M014-IgE Epicoccum purpur: <0.1 kU/L
Phoma Betae IgE: <0.1 kU/L
Setomelanomma Rostrat: <0.1 kU/L

## 2020-05-18 LAB — ALPHA-GAL PANEL
Alpha Gal IgE*: <0.1 kU/L (ref <0.10)
Beef (Bos spp) IgE: <0.1 kU/L (ref <0.35)
Class Interpretation: 0
Class Interpretation: 0
Class Interpretation: 0
Lamb/Mutton (Ovis spp) IgE: <0.1 kU/L (ref <0.35)
Pork (Sus spp) IgE: <0.1 kU/L (ref <0.35)

## 2020-05-23 ENCOUNTER — Other Ambulatory Visit: Payer: Self-pay | Admitting: Neurosurgery

## 2020-05-25 ENCOUNTER — Other Ambulatory Visit: Payer: Self-pay | Admitting: Neurosurgery

## 2020-05-26 ENCOUNTER — Ambulatory Visit (INDEPENDENT_AMBULATORY_CARE_PROVIDER_SITE_OTHER): Payer: Medicare HMO

## 2020-05-26 ENCOUNTER — Telehealth: Payer: Self-pay | Admitting: Allergy & Immunology

## 2020-05-26 ENCOUNTER — Other Ambulatory Visit: Payer: Self-pay

## 2020-05-26 DIAGNOSIS — J4540 Moderate persistent asthma, uncomplicated: Secondary | ICD-10-CM

## 2020-05-26 MED ORDER — PREDNISONE 10 MG PO TABS: ORAL_TABLET | ORAL | 0 refills | Status: DC

## 2020-05-26 NOTE — Telephone Encounter (Signed)
Called the pharmacy to see if they received the prescription for [facility] and they do have it patient did not pick it up when it was sent in May. Will call patient to let her know but, can you please advise on the prednisone.

## 2020-05-26 NOTE — Telephone Encounter (Signed)
Patient called and would like a prescription for the inhaler that was given to her to try, [facility]. Please send to [facility] on [address]. Patient also would like prednisone sent in because she is still itching and she has used whole a box of Benadryl.  Please advise.

## 2020-05-26 NOTE — Telephone Encounter (Signed)
Prednisone sent in.  Salvatore Marvel, MD [facility]

## 2020-06-01 ENCOUNTER — Telehealth (INDEPENDENT_AMBULATORY_CARE_PROVIDER_SITE_OTHER): Payer: Medicare HMO | Admitting: Psychiatry

## 2020-06-01 ENCOUNTER — Other Ambulatory Visit: Payer: Self-pay

## 2020-06-01 DIAGNOSIS — F3110 Bipolar disorder, current episode manic without psychotic features, unspecified: Secondary | ICD-10-CM

## 2020-06-01 MED ORDER — HYDROXYZINE PAMOATE 25 MG PO CAPS: 25.0000 mg | ORAL_CAPSULE | ORAL | 5 refills | Status: DC

## 2020-06-01 MED ORDER — CARBAMAZEPINE 200 MG PO TABS: 400.0000 mg | ORAL_TABLET | Freq: Two times a day (BID) | ORAL | 12 refills | Status: DC
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
## 2020-06-03 ENCOUNTER — Emergency Department (HOSPITAL_COMMUNITY)
Admission: EM | Admit: 2020-06-03 | Discharge: 2020-06-04 | Disposition: A | Discharge status: Home / Self Care | Payer: Medicare HMO | Attending: Emergency Medicine | Admitting: Emergency Medicine

## 2020-06-03 ENCOUNTER — Encounter (HOSPITAL_COMMUNITY): Payer: Self-pay | Admitting: Emergency Medicine

## 2020-06-03 ENCOUNTER — Emergency Department (HOSPITAL_COMMUNITY): Payer: Medicare HMO

## 2020-06-03 ENCOUNTER — Other Ambulatory Visit: Payer: Self-pay

## 2020-06-03 DIAGNOSIS — I10 Essential (primary) hypertension: Secondary | ICD-10-CM | POA: Insufficient documentation

## 2020-06-03 DIAGNOSIS — R0602 Shortness of breath: Secondary | ICD-10-CM | POA: Diagnosis not present

## 2020-06-03 DIAGNOSIS — M5412 Radiculopathy, cervical region: Secondary | ICD-10-CM | POA: Insufficient documentation

## 2020-06-03 DIAGNOSIS — Z79899 Other long term (current) drug therapy: Secondary | ICD-10-CM | POA: Insufficient documentation

## 2020-06-03 DIAGNOSIS — Z87891 Personal history of nicotine dependence: Secondary | ICD-10-CM | POA: Insufficient documentation

## 2020-06-03 DIAGNOSIS — J441 Chronic obstructive pulmonary disease with (acute) exacerbation: Secondary | ICD-10-CM | POA: Diagnosis not present

## 2020-06-03 MED ORDER — ALBUTEROL SULFATE (2.5 MG/3ML) 0.083% IN NEBU: 5.0000 mg | INHALATION_SOLUTION | Freq: Once | RESPIRATORY_TRACT | Status: DC

## 2020-06-03 NOTE — ED Triage Notes (Addendum)
Pt presents to ED POV. Pt c/o R shoulder and arm pain and SOB. Hx of asthma pt took inhalers and nebs w/o relief. Pt has cervical spine surgery scheduled next week. Pt tachypnic in triage

## 2020-06-04 DIAGNOSIS — J441 Chronic obstructive pulmonary disease with (acute) exacerbation: Secondary | ICD-10-CM | POA: Diagnosis not present

## 2020-06-04 LAB — CBC
HCT: 39.3 % (ref 36.0–46.0)
Hemoglobin: 12.6 g/dL (ref 12.0–15.0)
MCH: 28.7 pg (ref 26.0–34.0)
MCHC: 32.1 g/dL (ref 30.0–36.0)
MCV: 89.5 fL (ref 80.0–100.0)
Platelets: 431 10*3/uL — ABNORMAL HIGH (ref 150–400)
RBC: 4.39 MIL/uL (ref 3.87–5.11)
RDW: 15.3 % (ref 11.5–15.5)
WBC: 13.4 10*3/uL — ABNORMAL HIGH (ref 4.0–10.5)
nRBC: 0 % (ref 0.0–0.2)

## 2020-06-04 LAB — BASIC METABOLIC PANEL
Anion gap: 12 (ref 5–15)
BUN: 10 mg/dL (ref 6–20)
CO2: 24 mmol/L (ref 22–32)
Calcium: 9 mg/dL (ref 8.9–10.3)
Chloride: 103 mmol/L (ref 98–111)
Creatinine, Ser: 0.73 mg/dL (ref 0.44–1.00)
GFR calc Af Amer: >60 mL/min (ref >60)
GFR calc non Af Amer: >60 mL/min (ref >60)
Glucose, Bld: 137 mg/dL — ABNORMAL HIGH (ref 70–99)
Potassium: 4 mmol/L (ref 3.5–5.1)
Sodium: 139 mmol/L (ref 135–145)

## 2020-06-04 LAB — TROPONIN I (HIGH SENSITIVITY)
Troponin I (High Sensitivity): 5 ng/L (ref <18)
Troponin I (High Sensitivity): 8 ng/L (ref <18)

## 2020-06-04 MED ORDER — PREDNISONE 20 MG PO TABS
60.0000 mg | ORAL_TABLET | Freq: Once | ORAL | Status: AC
  Administered 2020-06-04: 60 mg via ORAL
  Filled 2020-06-04: qty 3

## 2020-06-04 MED ORDER — PREDNISONE 20 MG PO TABS
ORAL_TABLET | ORAL | 0 refills | Status: DC
Start: 2020-06-04 — End: 2020-06-12

## 2020-06-04 MED ORDER — KETOROLAC TROMETHAMINE 60 MG/2ML IM SOLN
15.0000 mg | Freq: Once | INTRAMUSCULAR | Status: AC
  Administered 2020-06-04: 15 mg via INTRAMUSCULAR
  Filled 2020-06-04: qty 2

## 2020-06-04 MED ORDER — ALBUTEROL SULFATE HFA 108 (90 BASE) MCG/ACT IN AERS
8.0000 | INHALATION_SPRAY | Freq: Once | RESPIRATORY_TRACT | Status: AC
  Administered 2020-06-04: 8 via RESPIRATORY_TRACT
  Filled 2020-06-04: qty 6.7

## 2020-06-04 MED ORDER — DICLOFENAC SODIUM 1 % EX GEL
4.0000 g | Freq: Four times a day (QID) | CUTANEOUS | 0 refills | Status: DC
Start: 2020-06-04 — End: 2023-02-21

## 2020-06-04 MED ORDER — ACETAMINOPHEN 500 MG PO TABS
1000.0000 mg | ORAL_TABLET | Freq: Once | ORAL | Status: AC
  Administered 2020-06-04: 1000 mg via ORAL
  Filled 2020-06-04: qty 2

## 2020-06-04 MED ORDER — OXYCODONE HCL 5 MG PO TABS
5.0000 mg | ORAL_TABLET | Freq: Once | ORAL | Status: AC
  Administered 2020-06-04: 5 mg via ORAL
  Filled 2020-06-04: qty 1

## 2020-06-04 MED ORDER — IPRATROPIUM BROMIDE HFA 17 MCG/ACT IN AERS
2.0000 | INHALATION_SPRAY | Freq: Once | RESPIRATORY_TRACT | Status: AC
  Administered 2020-06-04: 2 via RESPIRATORY_TRACT
  Filled 2020-06-04: qty 12.9

## 2020-06-04 NOTE — Discharge Instructions (Signed)
Take tylenol 1000mg (2 extra strength) four times a day. Use the gel as prescribed.  Follow up with Dr. Arnoldo Morale.  Please return for worsening pain or weakness.  Return for worsening sob or if you need to use your inhaler at home more often than every 4 hours.

## 2020-06-04 NOTE — ED Provider Notes (Signed)
[facility] EMERGENCY DEPARTMENT Provider Note   CSN: [id number] Arrival date & time: 06/03/20  2326     History Chief Complaint  Patient presents with  . Shortness of Breath    [name] is a 59 y.o. female.  59 yo F with a cc of sob.  Going on for about 48 hours.  Started having worsening wheezing.  No fevers, mild cough. Feels like her asthma exacerbation previously.  She also has been having right-sided neck pain that she has a scheduled procedure with Dr. Arnoldo Morale in about a week.  She is not been taking any medicines because she is not sure what she can take.  She also tells me she is not a pill person and so she is completely stopped taking them.  She has right-sided pain that radiates down the back of her arm.  Denies any weakness or numbness denies fevers.  Denies recent injury.  The history is provided by the patient.  Shortness of Breath Severity:  Moderate Onset quality:  Gradual Duration:  2 days Timing:  Constant Progression:  Worsening Chronicity:  New Relieved by:  Nothing Worsened by:  Nothing Ineffective treatments:  None tried Associated symptoms: cough, neck pain and wheezing   Associated symptoms: no chest pain, no fever, no headaches and no vomiting        Past Medical History:  Diagnosis Date  . ALLERGIC RHINITIS 05/12/2009   Qualifier: Diagnosis of  By: Lenn Cal Deborra Medina), Wynona Canes    . Anemia, iron deficiency 12/22/2014  . Angina   . Anxiety   . Arthritis   . Asthma   . ASTHMA 05/12/2009   Severe AFL (Spirometry 05/2009: pre-BD FEV1 0.87L 34% pred, post-BD FEV1 1.11L 44% pred) Volumes hyperinflated Decreased DLCO that does not fully correct to normal range for alveolar volume.     Marland Kitchen COPD 08/24/2009   Qualifier: Diagnosis of  By: Burnett Kanaris    . COPD (chronic obstructive pulmonary disease) ([place])   . Depression   . Fibromyalgia 05/14/2014  . GERD (gastroesophageal reflux disease)   . Hyperlipidemia 04/20/2017  .  HYPERTENSION 05/12/2009   Qualifier: Diagnosis of  By: Lenn Cal Deborra Medina), Wynona Canes    . Migraine   . Nephrolithiasis   . Peripheral vascular disease ([place])   . Prediabetes 02/23/2014  . Seizure ([place])   . Seizure ([place])   . Urticaria     Patient Active Problem List   Diagnosis Date Noted  . Leg cramping 08/12/2019  . Hypophosphatemia 01/07/2019  . Vertigo 09/23/2018  . Dysfunction of left eustachian tube 09/23/2018  . Gait disorder 04/21/2018  . Leukocytosis 04/15/2018  . Nausea & vomiting 04/15/2018  . Seizure ([place])   . Cervical radiculopathy 02/18/2018  . 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 Syncope 09/04/2017  . Constipation 09/04/2017  . Nonintractable headache 09/04/2017  . Leg swelling 07/26/2017  . 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 Hyperlipidemia 04/20/2017  . Pedal edema 04/19/2017  . Bipolar I disorder ([place]) 09/19/2016  . Facial pain 07/12/2016  . Rash 06/14/2016  . Migraine 01/25/2016  . Idiopathic urticaria/pruritus 01/23/2016  . Perennial allergic rhinitis 01/23/2016  . Oral candidiasis 01/23/2016  . Greater trochanteric bursitis of both hips 09/08/2015  . Chest pain 06/22/2015  . Itching 06/22/2015  . Bilateral knee pain 06/22/2015  . Bilateral hip pain 06/22/2015  . Anemia, iron deficiency 12/22/2014  . Fatigue 12/21/2014  . Intertrigo 08/07/2014  . Dyspareunia 07/12/2014  . Menopausal syndrome (hot flushes) 07/12/2014  .  History of tobacco abuse 07/12/2014  . Dizziness 06/22/2014  . Weakness 06/22/2014  . Fibromyalgia 05/14/2014  . Preventative health care 04/22/2014  . Recurrent boils 04/22/2014  . Recurrent falls 04/22/2014  . Peripheral vascular disease ([place])   . Depression   . Anxiety   . GERD (gastroesophageal reflux disease)   . Prediabetes 02/23/2014  . Screening mammogram for high-risk patient 02/23/2014  . Back pain 07/22/2013  . COPD/asthma 08/24/2009  . Headache(784.0) 08/24/2009  . HTN (hypertension) 05/12/2009    Past  Surgical History:  Procedure Laterality Date  . ABDOMINAL HYSTERECTOMY    . BACK SURGERY    . COLONOSCOPY  12/20/2011   Procedure: COLONOSCOPY;  Surgeon: Landry Dyke, MD;  Location: [facility];  Service: Endoscopy;  Laterality: N/A;  . COLONOSCOPY  03/05/2012   Procedure: COLONOSCOPY;  Surgeon: Landry Dyke, MD;  Location: [facility] ENDOSCOPY;  Service: Endoscopy;  Laterality: N/A;  . DIAGNOSTIC LAPAROSCOPY    . HEMORRHOID SURGERY    . INCISE AND DRAIN ABCESS    . KIDNEY STONE SURGERY    . NECK SURGERY     x 2 Dr Orinda Kenner  . SPINE SURGERY  2019  . TOE SURGERY    . TUBAL LIGATION       OB History    Gravida  7   Para  3   Term  3   Preterm      AB  4   Living  3     SAB  3   TAB  1   Ectopic      Multiple      Live Births              Family History  Problem Relation Age of Onset  . Diabetes Mother   . COPD Mother   . Heart disease Mother   . Asthma Mother   . Diabetes Father   . Kidney disease Father   . Cancer Father   . Anesthesia problems Father   . Alcohol abuse Father   . Diabetes Sister   . Hypertension Sister   . Diabetes Brother   . Sleep apnea Brother   . Asthma Brother   . Alcohol abuse Brother   . Heart disease Sister   . Diabetes Sister   . Brain cancer Sister   . Heart disease Brother   . Asthma Sister   . Allergic rhinitis Neg Hx   . Eczema Neg Hx   . Urticaria Neg Hx     Social History   Tobacco Use  . Smoking status: Former Smoker    Packs/day: 0.25    Years: 36.00    Pack years: 9.00    Types: Cigarettes    Quit date: 01/21/2019    Years since quitting: 1.3  . Smokeless tobacco: Never Used  . Tobacco comment: Passive smoker.  Patient states her quit date was 05/09/2018.  Patient educated with resources at today's appointment to continue to support her to stop smoking.  Vaping Use  . Vaping Use: Never used  Substance Use Topics  . Alcohol use: Not Currently    Alcohol/week: 0.0 standard drinks  . Drug use: Yes     Types: Marijuana    Comment: TWICE A MONTH, 12/28/19 "every now and then"     Home Medications Prior to Admission medications   Medication Sig Start Date End Date Taking? Authorizing Provider  albuterol (PROAIR HFA) 108 (90 Base) MCG/ACT inhaler Inhale 4 puffs into the  lungs every 4 (four) hours as needed for wheezing or shortness of breath. Patient taking differently: Inhale 2 puffs into the lungs daily.  05/12/20   Valentina Shaggy, MD  albuterol (PROVENTIL) (2.5 MG/3ML) 0.083% nebulizer solution Take 3 mLs (2.5 mg total) by nebulization every 4 (four) hours as needed for wheezing or shortness of breath. Reported on 06/04/2016 Patient taking differently: Take 2.5 mg by nebulization every 4 (four) hours as needed for wheezing or shortness of breath.  12/23/18   Valentina Shaggy, MD  Aspirin-Caffeine [facility] FAST PAIN RELIEF ARTHRITIS) 1000-65 MG PACK Take 1 Package by mouth daily as needed (Knee pain).    [provider]  Aspirin-Salicylamide-Caffeine (BC HEADACHE POWDER PO) Take 1 Package by mouth daily as needed (Headache).    [provider]  Biotin 1000 MCG tablet Take 1,000 mcg by mouth daily.     [provider]  Budeson-Glycopyrrol-Formoterol (BREZTRI AEROSPHERE) 160-9-4.8 MCG/ACT AERO Inhale 2 puffs into the lungs 2 (two) times daily. Use with spacer. Patient taking differently: Inhale 2 puffs into the lungs daily. Use with spacer. 05/12/20   Valentina Shaggy, MD  budesonide-formoterol [facility]) 160-4.5 MCG/ACT inhaler Inhale 2 puffs into the lungs daily.    [provider]  carbamazepine (TEGRETOL) 200 MG tablet Take 2 tablets (400 mg total) by mouth 2 (two) times daily. 06/01/20   Plovsky, Berneta Sages, MD  Cholecalciferol (VITAMIN D) 125 MCG (5000 UT) CAPS Take 5,000 Units by mouth daily.    [provider]  clindamycin (CLINDAGEL) 1 % gel Apply topically daily as needed. Patient taking differently: Apply 1 application topically daily  as needed.  03/10/20   Princess Bruins, MD  DUPIXENT 300 MG/2ML prefilled syringe INJECT 300MG  (1 SYRINGE) SUBCUTANEOUSLY EVERY 14 DAYS Patient taking differently: Inject 300 mg into the skin every 14 (fourteen) days.  02/29/20   Valentina Shaggy, MD  estradiol (ESTRACE) 0.5 MG tablet Take 1 tablet (0.5 mg total) by mouth daily. 04/28/19   Princess Bruins, MD  famotidine (PEPCID) 20 MG tablet Take 1 tablet (20 mg total) by mouth 2 (two) times daily. 05/12/20   Valentina Shaggy, MD  fluticasone [facility]) 50 MCG/ACT nasal spray Place 1 spray into both nostrils 2 (two) times daily. Patient taking differently: Place 1 spray into both nostrils daily as needed for allergies.  05/12/20   Valentina Shaggy, MD  hydrochlorothiazide (MICROZIDE) 12.5 MG capsule Take 1 capsule (12.5 mg total) by mouth daily. 11/04/19   Biagio Borg, MD  hydrOXYzine (VISTARIL) 25 MG capsule Take 1 capsule (25 mg total) by mouth See admin instructions. 25 mg morning ,  25 mg at noon, 50 mg at bedtime 06/01/20   Plovsky, Berneta Sages, MD  irbesartan-hydrochlorothiazide (AVALIDE) 150-12.5 MG tablet Take 1 tablet by mouth daily. 11/04/19   Biagio Borg, MD  lactulose, encephalopathy, (GENERLAC) 10 GM/15ML SOLN TAKE 45 TO 90 ML( 30-60 GM) BY MOUTH TWICE DAILY AS NEEDED Patient taking differently: Take 30-60 g by mouth See admin instructions. TAKE 45 TO 90 ML( 30-60 GM) BY MOUTH TWICE DAILY AS NEEDED 03/20/19   Biagio Borg, MD  levocetirizine (XYZAL) 5 MG tablet Take 1 tablet (5 mg total) by mouth 2 (two) times daily. Patient taking differently: Take 5 mg by mouth daily.  05/12/20   Valentina Shaggy, MD  meclizine (ANTIVERT) 12.5 MG tablet TAKE 1 TABLET(12.5 MG) BY MOUTH THREE TIMES DAILY AS NEEDED FOR DIZZINESS Patient taking differently: Take 12.5 mg by mouth 3 (three)  times daily as needed for dizziness.  04/07/19   Biagio Borg, MD  montelukast (SINGULAIR) 10 MG tablet Take 1 tablet (10 mg total) by mouth at bedtime.  03/30/20   Martyn Ehrich, NP  pantoprazole (PROTONIX) 40 MG tablet 1 by mouth once daily Patient taking differently: Take 40 mg by mouth daily.  03/07/20   Martyn Ehrich, NP  pravastatin (PRAVACHOL) 40 MG tablet TAKE 1 TABLET(40 MG) BY MOUTH DAILY Patient taking differently: Take 40 mg by mouth daily.  11/04/19   Biagio Borg, MD  predniSONE (DELTASONE) 10 MG tablet Take two tablets (20mg ) twice daily for three days, then one tablet (10mg ) twice daily for three days, then STOP. 05/26/20   Valentina Shaggy, MD  predniSONE (DELTASONE) 20 MG tablet 2 tabs po daily x 4 days 06/04/20   Deno Etienne, DO  Tiotropium Bromide Monohydrate (SPIRIVA RESPIMAT) 2.5 MCG/ACT AERS Inhale 2 puffs into the lungs daily. 01/06/20   Collene Gobble, MD  triamcinolone cream (KENALOG) 0.1 % APPLY EXTERNALLY TO THE AFFECTED AREA TWICE DAILY Patient taking differently: Apply 1 application topically daily as needed (Rash).  04/14/20   Biagio Borg, MD  vitamin C (ASCORBIC ACID) 500 MG tablet Take 500 mg by mouth daily.     [provider]  vitamin E 180 MG (400 UNITS) capsule Take 400 Units by mouth daily.    [provider]    Allergies    Codeine, Sulfa antibiotics, and Sulfonamide derivatives  Review of Systems   Review of Systems  Constitutional: Negative for chills and fever.  HENT: Negative for congestion and rhinorrhea.   Eyes: Negative for redness and visual disturbance.  Respiratory: Positive for cough, shortness of breath and wheezing.   Cardiovascular: Negative for chest pain and palpitations.  Gastrointestinal: Negative for nausea and vomiting.  Genitourinary: Negative for dysuria and urgency.  Musculoskeletal: Positive for arthralgias and neck pain. Negative for myalgias.  Skin: Negative for pallor and wound.  Neurological: Negative for dizziness and headaches.    Physical Exam Updated Vital Signs BP 109/69 (BP Location: Left Arm)   Pulse 68   Temp 98.5 F (36.9 C)  (Oral)   Resp 18   SpO2 100%   Physical Exam Vitals and nursing note reviewed.  Constitutional:      General: She is not in acute distress.    Appearance: She is well-developed. She is not diaphoretic.  HENT:     Head: Normocephalic and atraumatic.  Eyes:     Pupils: Pupils are equal, round, and reactive to light.  Neck:     Comments: Tenderness along the trapezius.  PMS intact to the LUE.  No weakness.  Cardiovascular:     Rate and Rhythm: Normal rate and regular rhythm.     Heart sounds: No murmur heard.  No friction rub. No gallop.   Pulmonary:     Effort: Pulmonary effort is normal.     Breath sounds: No wheezing or rales.  Abdominal:     General: There is no distension.     Palpations: Abdomen is soft.     Tenderness: There is no abdominal tenderness.  Musculoskeletal:        General: No tenderness.     Cervical back: Normal range of motion and neck supple.  Skin:    General: Skin is warm and dry.  Neurological:     Mental Status: She is alert and oriented to person, place, and time.  Psychiatric:  Behavior: Behavior normal.     ED Results / Procedures / Treatments   Labs (all labs ordered are listed, but only abnormal results are displayed) Labs Reviewed  CBC - Abnormal; Notable for the following components:      Result Value   WBC 13.4 (*)    Platelets 431 (*)    All other components within normal limits  BASIC METABOLIC PANEL - Abnormal; Notable for the following components:   Glucose, Bld 137 (*)    All other components within normal limits  TROPONIN I (HIGH SENSITIVITY)  TROPONIN I (HIGH SENSITIVITY)    EKG EKG Interpretation  Date/Time:  Friday June 03 2020 23:26:33 EDT Ventricular Rate:  74 PR Interval:    QRS Duration: 74 QT Interval:  376 QTC Calculation: 417 R Axis:   62 Text Interpretation: Normal sinus rhythm Confirmed by Randal Buba, April ([id number]) on 06/04/2020 12:46:19 AM   Radiology DG Chest 2 View  Result Date:  06/04/2020 CLINICAL DATA:  Shortness of breath EXAM: CHEST - 2 VIEW COMPARISON:  None. FINDINGS: The heart size and mediastinal contours are within normal limits. Aortic knob calcifications. Both lungs are clear. The visualized skeletal structures are unremarkable. Cervical fixation hardware is seen. IMPRESSION: No active cardiopulmonary disease. Electronically Signed   By: Prudencio Pair M.D.   On: 06/04/2020 00:04    Procedures Procedures (including critical care time)  Medications Ordered in ED Medications  albuterol (PROVENTIL) (2.5 MG/3ML) 0.083% nebulizer solution 5 mg (has no administration in time range)  acetaminophen (TYLENOL) tablet 1,000 mg (has no administration in time range)  ketorolac (TORADOL) injection 15 mg (has no administration in time range)  oxyCODONE (Oxy IR/ROXICODONE) immediate release tablet 5 mg (has no administration in time range)  albuterol (VENTOLIN HFA) 108 (90 Base) MCG/ACT inhaler 8 puff (has no administration in time range)  ipratropium (ATROVENT HFA) inhaler 2 puff (has no administration in time range)  predniSONE (DELTASONE) tablet 60 mg (has no administration in time range)    ED Course  I have reviewed the triage vital signs and the nursing notes.  Pertinent labs & imaging results that were available during my care of the patient were reviewed by me and considered in my medical decision making (see chart for details).    MDM Rules/Calculators/A&P                          59 yo F with a chief complaint of an asthma exacerbation and cervical radiculopathy.  Both of these problems are chronic though with her exacerbation we will start her on burst of steroids which should improve her neck pain as well.  Have her follow-up with her surgeon.  The patient had laboratory work-up ordered in triage.  No concerning finding.  Negative delta troponin.  Chest x-ray viewed by me without focal tripped or pneumothorax. 7:18 AM:  I have discussed the  diagnosis/risks/treatment options with the patient and believe the pt to be eligible for discharge home to follow-up with PCP. We also discussed returning to the ED immediately if new or worsening sx occur. We discussed the sx which are most concerning (e.g., sudden worsening pain, fever, inability to tolerate by mouth, worsening sob, need to use inhaler more often than every 4 hours) that necessitate immediate return. Medications administered to the patient during their visit and any new prescriptions provided to the patient are listed below.  Medications given during this visit Medications  albuterol (PROVENTIL) (2.5 MG/3ML) 0.083%  nebulizer solution 5 mg (has no administration in time range)  acetaminophen (TYLENOL) tablet 1,000 mg (has no administration in time range)  ketorolac (TORADOL) injection 15 mg (has no administration in time range)  oxyCODONE (Oxy IR/ROXICODONE) immediate release tablet 5 mg (has no administration in time range)  albuterol (VENTOLIN HFA) 108 (90 Base) MCG/ACT inhaler 8 puff (has no administration in time range)  ipratropium (ATROVENT HFA) inhaler 2 puff (has no administration in time range)  predniSONE (DELTASONE) tablet 60 mg (has no administration in time range)     The patient appears reasonably screen and/or stabilized for discharge and I doubt any other medical condition or other [facility] requiring further screening, evaluation, or treatment in the ED at this time prior to discharge.   Final Clinical Impression(s) / ED Diagnoses Final diagnoses:  COPD exacerbation ([place])  Cervical radiculopathy    Rx / DC Orders ED Discharge Orders         Ordered    predniSONE (DELTASONE) 20 MG tablet     Discontinue  Reprint     06/04/20 [place], [place], DO 06/04/20 [id number]

## 2020-06-06 NOTE — Progress Notes (Addendum)
[id number] [address], [address] [address] Phone: [phone] Fax: [phone]  [facility] [place], [address] [place] [address] Phone: [phone] Fax: [place], [place] [address] [place] [address] Phone: [phone] Fax: [phone]      Your procedure is scheduled on June 09, 2020.  Report to [facility] Main Entrance "A" at 10:45 A.M., and check in at the Admitting office.  Call this number if you have problems the morning of surgery:  [phone]  Call [phone] if you have any questions prior to your surgery date Monday-Friday 8am-4pm    Remember:  Do not eat or drink after midnight the night before your surgery    Take these medicines the morning of surgery with A SIP OF WATER: Budeson-Glycopyrrol-Formoterol (BREZTRI AEROSPHERE) budesonide-formoterol (SYMBICORT) carbamazepine (TEGRETOL) estradiol (ESTRACE)  famotidine (PEPCID) hydrOXYzine (VISTARIL) levocetirizine (XYZAL) pantoprazole (PROTONIX)  pravastatin (PRAVACHOL) Tiotropium Bromide Monohydrate (SPIRIVA RESPIMAT)  albuterol (PROAIR HFA)  albuterol (PROVENTIL) (2.5 MG/3ML) 0.083% nebulizer solution - as needed   As of today, STOP taking any Aspirin (unless otherwise instructed by your surgeon) and Aspirin containing products, Aleve, Naproxen, Ibuprofen, Motrin, Advil, Goody's, BC's, all herbal medications, fish oil, and all vitamins.                      Do not wear jewelry, make up, or nail polish            Do not wear lotions, powders, perfumes or deodorant.            Do not shave 48 hours prior to surgery.              Do not bring valuables to the hospital.            [facility] is not responsible for any belongings or valuables.  Do  NOT Smoke (Tobacco/Vapping) or drink Alcohol 24 hours prior to your procedure If you use a CPAP at night, you may bring all equipment for your overnight stay.   Contacts, glasses, dentures or bridgework may not be worn into surgery.      For patients admitted to the hospital, discharge time will be determined by your treatment team.   Patients discharged the day of surgery will not be allowed to drive home, and someone needs to stay with them for 24 hours.    Special instructions:   [place]- Preparing For Surgery  Before surgery, you can play an important role. Because skin is not sterile, your skin needs to be as free of germs as possible. You can reduce the number of germs on your skin by washing with CHG (chlorahexidine gluconate) Soap before surgery.  CHG is an antiseptic cleaner which kills germs and bonds with the skin to continue killing germs even after washing.    Oral Hygiene is also important to reduce your risk of infection.  Remember - BRUSH YOUR TEETH THE MORNING OF SURGERY WITH YOUR REGULAR TOOTHPASTE  Please do not use if you have an allergy to CHG or antibacterial soaps. If your skin becomes reddened/irritated stop using the CHG.  Do not shave (including legs and underarms) for at least 48 hours prior to first CHG shower. It is OK to shave your face.  Please follow these instructions carefully.   1. Shower the NIGHT BEFORE SURGERY and the MORNING OF SURGERY with CHG Soap.   2. If you chose to wash your hair, wash your hair first as usual with your normal shampoo.  3. After you shampoo, rinse your hair and body thoroughly to remove the shampoo.  4. Use CHG as you would any other liquid soap. You can apply CHG directly to the skin and wash gently with a scrungie or a clean washcloth.   5. Apply the CHG Soap to your body ONLY FROM THE NECK DOWN.  Do not use on open wounds or open sores. Avoid contact with your eyes, ears, mouth and genitals (private parts). Wash Face  and genitals (private parts)  with your normal soap.   6. Wash thoroughly, paying special attention to the area where your surgery will be performed.  7. Thoroughly rinse your body with warm water from the neck down.  8. DO NOT shower/wash with your normal soap after using and rinsing off the CHG Soap.  9. Pat yourself dry with a CLEAN TOWEL.  10. Wear CLEAN PAJAMAS to bed the night before surgery, wear comfortable clothes the morning of surgery  11. Place CLEAN SHEETS on your bed the night of your first shower and DO NOT SLEEP WITH PETS.   Day of Surgery:   Do not apply any deodorants/lotions.  Please wear clean clothes to the hospital/surgery center.   Remember to brush your teeth WITH YOUR REGULAR TOOTHPASTE.   Please read over the following fact sheets that you were given.

## 2020-06-07 ENCOUNTER — Encounter (HOSPITAL_COMMUNITY)
Admission: RE | Admit: 2020-06-07 | Discharge: 2020-06-07 | Disposition: A | Discharge status: Home / Self Care | Payer: Medicare HMO | Source: Ambulatory Visit | Attending: Neurosurgery | Admitting: Neurosurgery

## 2020-06-07 ENCOUNTER — Encounter (HOSPITAL_COMMUNITY): Payer: Self-pay

## 2020-06-07 ENCOUNTER — Other Ambulatory Visit (HOSPITAL_COMMUNITY)
Admission: RE | Admit: 2020-06-07 | Discharge: 2020-06-07 | Disposition: A | Discharge status: Home / Self Care | Payer: Medicare HMO | Source: Ambulatory Visit | Attending: Neurosurgery | Admitting: Neurosurgery

## 2020-06-07 ENCOUNTER — Other Ambulatory Visit: Payer: Self-pay

## 2020-06-07 DIAGNOSIS — Z01812 Encounter for preprocedural laboratory examination: Secondary | ICD-10-CM | POA: Insufficient documentation

## 2020-06-07 DIAGNOSIS — Z20822 Contact with and (suspected) exposure to covid-19: Secondary | ICD-10-CM | POA: Insufficient documentation

## 2020-06-07 HISTORY — DX: Personal history of urinary calculi: Z87.442

## 2020-06-07 LAB — CBC
HCT: 36.1 % (ref 36.0–46.0)
Hemoglobin: 11.2 g/dL — ABNORMAL LOW (ref 12.0–15.0)
MCH: 27.5 pg (ref 26.0–34.0)
MCHC: 31 g/dL (ref 30.0–36.0)
MCV: 88.7 fL (ref 80.0–100.0)
Platelets: 394 10*3/uL (ref 150–400)
RBC: 4.07 MIL/uL (ref 3.87–5.11)
RDW: 15.1 % (ref 11.5–15.5)
WBC: 17.8 10*3/uL — ABNORMAL HIGH (ref 4.0–10.5)
nRBC: 0 % (ref 0.0–0.2)

## 2020-06-07 LAB — SURGICAL PCR SCREEN
MRSA, PCR: NEGATIVE
Staphylococcus aureus: NEGATIVE

## 2020-06-07 LAB — COMPREHENSIVE METABOLIC PANEL
ALT: 48 U/L — ABNORMAL HIGH (ref 0–44)
AST: 28 U/L (ref 15–41)
Albumin: 3.8 g/dL (ref 3.5–5.0)
Alkaline Phosphatase: 74 U/L (ref 38–126)
Anion gap: 9 (ref 5–15)
BUN: 17 mg/dL (ref 6–20)
CO2: 27 mmol/L (ref 22–32)
Calcium: 9.2 mg/dL (ref 8.9–10.3)
Chloride: 104 mmol/L (ref 98–111)
Creatinine, Ser: 0.67 mg/dL (ref 0.44–1.00)
GFR calc Af Amer: >60 mL/min (ref >60)
GFR calc non Af Amer: >60 mL/min (ref >60)
Glucose, Bld: 123 mg/dL — ABNORMAL HIGH (ref 70–99)
Potassium: 4.5 mmol/L (ref 3.5–5.1)
Sodium: 140 mmol/L (ref 135–145)
Total Bilirubin: 0.5 mg/dL (ref 0.3–1.2)
Total Protein: 6.3 g/dL — ABNORMAL LOW (ref 6.5–8.1)

## 2020-06-07 LAB — TYPE AND SCREEN
ABO/RH(D): O POS
Antibody Screen: NEGATIVE

## 2020-06-07 LAB — ABO/RH: ABO/RH(D): O POS

## 2020-06-07 LAB — SARS CORONAVIRUS 2 (TAT 6-24 HRS): SARS Coronavirus 2: NEGATIVE

## 2020-06-07 NOTE — Progress Notes (Signed)
PCP:  Cathlean Cower, MD Cardiologist:  Denies  EKG:  06/03/20 CXR:  06/03/20 ECHO:  05/03/17 Stress Test:  Denies Cardiac Cath:  Denies  Covid test 06/07/20  Patient denies shortness of breath, fever, cough, and chest pain at PAT appointment.  Patient verbalized understanding of instructions provided today at the PAT appointment.  Patient asked to review instructions at home and day of surgery.

## 2020-06-09 ENCOUNTER — Encounter (HOSPITAL_COMMUNITY): Payer: Self-pay | Admitting: Internal Medicine

## 2020-06-09 ENCOUNTER — Emergency Department (HOSPITAL_COMMUNITY): Payer: Medicare HMO

## 2020-06-09 ENCOUNTER — Encounter (HOSPITAL_COMMUNITY): Payer: Self-pay | Admitting: Certified Registered Nurse Anesthetist

## 2020-06-09 ENCOUNTER — Other Ambulatory Visit: Payer: Self-pay

## 2020-06-09 ENCOUNTER — Encounter (HOSPITAL_COMMUNITY)
Admission: EM | Disposition: A | Discharge status: Home / Self Care | Payer: Self-pay | Source: Home / Self Care | Attending: Internal Medicine

## 2020-06-09 ENCOUNTER — Inpatient Hospital Stay (HOSPITAL_COMMUNITY)
Admission: EM | Admit: 2020-06-09 | Discharge: 2020-06-12 | DRG: 202 | Disposition: A | Discharge status: Home Health | Payer: Medicare HMO | Attending: Internal Medicine | Admitting: Internal Medicine

## 2020-06-09 ENCOUNTER — Ambulatory Visit (HOSPITAL_COMMUNITY): Admission: RE | Admit: 2020-06-09 | Payer: Medicare HMO | Source: Home / Self Care | Admitting: Neurosurgery

## 2020-06-09 DIAGNOSIS — I952 Hypotension due to drugs: Secondary | ICD-10-CM | POA: Diagnosis present

## 2020-06-09 DIAGNOSIS — J45902 Unspecified asthma with status asthmaticus: Secondary | ICD-10-CM | POA: Diagnosis present

## 2020-06-09 DIAGNOSIS — J4522 Mild intermittent asthma with status asthmaticus: Secondary | ICD-10-CM

## 2020-06-09 DIAGNOSIS — Z7982 Long term (current) use of aspirin: Secondary | ICD-10-CM

## 2020-06-09 DIAGNOSIS — I10 Essential (primary) hypertension: Secondary | ICD-10-CM | POA: Diagnosis present

## 2020-06-09 DIAGNOSIS — M5412 Radiculopathy, cervical region: Secondary | ICD-10-CM | POA: Diagnosis present

## 2020-06-09 DIAGNOSIS — J441 Chronic obstructive pulmonary disease with (acute) exacerbation: Secondary | ICD-10-CM | POA: Diagnosis present

## 2020-06-09 DIAGNOSIS — Z79899 Other long term (current) drug therapy: Secondary | ICD-10-CM

## 2020-06-09 DIAGNOSIS — R7303 Prediabetes: Secondary | ICD-10-CM | POA: Diagnosis present

## 2020-06-09 DIAGNOSIS — J4552 Severe persistent asthma with status asthmaticus: Secondary | ICD-10-CM | POA: Diagnosis not present

## 2020-06-09 DIAGNOSIS — Z882 Allergy status to sulfonamides status: Secondary | ICD-10-CM

## 2020-06-09 DIAGNOSIS — Z7951 Long term (current) use of inhaled steroids: Secondary | ICD-10-CM

## 2020-06-09 DIAGNOSIS — F419 Anxiety disorder, unspecified: Secondary | ICD-10-CM | POA: Diagnosis present

## 2020-06-09 DIAGNOSIS — M797 Fibromyalgia: Secondary | ICD-10-CM | POA: Diagnosis present

## 2020-06-09 DIAGNOSIS — F319 Bipolar disorder, unspecified: Secondary | ICD-10-CM | POA: Diagnosis present

## 2020-06-09 DIAGNOSIS — I739 Peripheral vascular disease, unspecified: Secondary | ICD-10-CM | POA: Diagnosis present

## 2020-06-09 DIAGNOSIS — R069 Unspecified abnormalities of breathing: Secondary | ICD-10-CM | POA: Diagnosis not present

## 2020-06-09 DIAGNOSIS — M1990 Unspecified osteoarthritis, unspecified site: Secondary | ICD-10-CM | POA: Diagnosis present

## 2020-06-09 DIAGNOSIS — Z885 Allergy status to narcotic agent status: Secondary | ICD-10-CM

## 2020-06-09 DIAGNOSIS — J80 Acute respiratory distress syndrome: Secondary | ICD-10-CM | POA: Diagnosis not present

## 2020-06-09 DIAGNOSIS — R0689 Other abnormalities of breathing: Secondary | ICD-10-CM | POA: Diagnosis not present

## 2020-06-09 DIAGNOSIS — R42 Dizziness and giddiness: Secondary | ICD-10-CM | POA: Diagnosis present

## 2020-06-09 DIAGNOSIS — J4551 Severe persistent asthma with (acute) exacerbation: Secondary | ICD-10-CM | POA: Diagnosis not present

## 2020-06-09 DIAGNOSIS — Z20822 Contact with and (suspected) exposure to covid-19: Secondary | ICD-10-CM | POA: Diagnosis present

## 2020-06-09 DIAGNOSIS — K219 Gastro-esophageal reflux disease without esophagitis: Secondary | ICD-10-CM | POA: Diagnosis present

## 2020-06-09 DIAGNOSIS — T50905A Adverse effect of unspecified drugs, medicaments and biological substances, initial encounter: Secondary | ICD-10-CM | POA: Diagnosis not present

## 2020-06-09 DIAGNOSIS — Z8719 Personal history of other diseases of the digestive system: Secondary | ICD-10-CM

## 2020-06-09 DIAGNOSIS — Z791 Long term (current) use of non-steroidal anti-inflammatories (NSAID): Secondary | ICD-10-CM

## 2020-06-09 DIAGNOSIS — R569 Unspecified convulsions: Secondary | ICD-10-CM | POA: Diagnosis present

## 2020-06-09 DIAGNOSIS — R0602 Shortness of breath: Secondary | ICD-10-CM | POA: Diagnosis not present

## 2020-06-09 DIAGNOSIS — Z87891 Personal history of nicotine dependence: Secondary | ICD-10-CM

## 2020-06-09 DIAGNOSIS — R062 Wheezing: Secondary | ICD-10-CM | POA: Diagnosis not present

## 2020-06-09 LAB — CBC WITH DIFFERENTIAL/PLATELET
Abs Immature Granulocytes: 0 10*3/uL (ref 0.00–0.07)
Basophils Absolute: 0.2 10*3/uL — ABNORMAL HIGH (ref 0.0–0.1)
Basophils Relative: 1 %
Eosinophils Absolute: 0 10*3/uL (ref 0.0–0.5)
Eosinophils Relative: 0 %
HCT: 32 % — ABNORMAL LOW (ref 36.0–46.0)
Hemoglobin: 10 g/dL — ABNORMAL LOW (ref 12.0–15.0)
Lymphocytes Relative: 38 %
Lymphs Abs: 6.3 10*3/uL — ABNORMAL HIGH (ref 0.7–4.0)
MCH: 28.6 pg (ref 26.0–34.0)
MCHC: 31.3 g/dL (ref 30.0–36.0)
MCV: 91.4 fL (ref 80.0–100.0)
Monocytes Absolute: 0.8 10*3/uL (ref 0.1–1.0)
Monocytes Relative: 5 %
Neutro Abs: 9.2 10*3/uL — ABNORMAL HIGH (ref 1.7–7.7)
Neutrophils Relative %: 56 %
Platelets: 340 10*3/uL (ref 150–400)
RBC: 3.5 MIL/uL — ABNORMAL LOW (ref 3.87–5.11)
RDW: 15.3 % (ref 11.5–15.5)
WBC: 16.5 10*3/uL — ABNORMAL HIGH (ref 4.0–10.5)
nRBC: 0 % (ref 0.0–0.2)
nRBC: 0 /100 WBC

## 2020-06-09 LAB — I-STAT ARTERIAL BLOOD GAS, ED
Acid-Base Excess: 5 mmol/L — ABNORMAL HIGH (ref 0.0–2.0)
Acid-Base Excess: 1 mmol/L (ref 0.0–2.0)
Bicarbonate: 31.5 mmol/L — ABNORMAL HIGH (ref 20.0–28.0)
Bicarbonate: 27.6 mmol/L (ref 20.0–28.0)
Calcium, Ion: 1.23 mmol/L (ref 1.15–1.40)
Calcium, Ion: 1.26 mmol/L (ref 1.15–1.40)
HCT: 32 % — ABNORMAL LOW (ref 36.0–46.0)
HCT: 34 % — ABNORMAL LOW (ref 36.0–46.0)
Hemoglobin: 10.9 g/dL — ABNORMAL LOW (ref 12.0–15.0)
Hemoglobin: 11.6 g/dL — ABNORMAL LOW (ref 12.0–15.0)
O2 Saturation: 100 %
O2 Saturation: 99 %
Patient temperature: 98.4
Patient temperature: 98.4
Potassium: 3.5 mmol/L (ref 3.5–5.1)
Potassium: 4.4 mmol/L (ref 3.5–5.1)
Sodium: 142 mmol/L (ref 135–145)
Sodium: 139 mmol/L (ref 135–145)
TCO2: 33 mmol/L — ABNORMAL HIGH (ref 22–32)
TCO2: 29 mmol/L (ref 22–32)
pCO2 arterial: 53.5 mmHg — ABNORMAL HIGH (ref 32.0–48.0)
pCO2 arterial: 49.9 mmHg — ABNORMAL HIGH (ref 32.0–48.0)
pH, Arterial: 7.378 (ref 7.350–7.450)
pH, Arterial: 7.35 (ref 7.350–7.450)
pO2, Arterial: 215 mmHg — ABNORMAL HIGH (ref 83.0–108.0)
pO2, Arterial: 163 mmHg — ABNORMAL HIGH (ref 83.0–108.0)

## 2020-06-09 LAB — RESPIRATORY PANEL BY PCR

## 2020-06-09 LAB — COMPREHENSIVE METABOLIC PANEL
ALT: 33 U/L (ref 0–44)
AST: 21 U/L (ref 15–41)
Albumin: 3.1 g/dL — ABNORMAL LOW (ref 3.5–5.0)
Alkaline Phosphatase: 59 U/L (ref 38–126)
Anion gap: 8 (ref 5–15)
BUN: 11 mg/dL (ref 6–20)
CO2: 26 mmol/L (ref 22–32)
Calcium: 8 mg/dL — ABNORMAL LOW (ref 8.9–10.3)
Chloride: 109 mmol/L (ref 98–111)
Creatinine, Ser: 0.65 mg/dL (ref 0.44–1.00)
GFR calc Af Amer: >60 mL/min (ref >60)
GFR calc non Af Amer: >60 mL/min (ref >60)
Glucose, Bld: 118 mg/dL — ABNORMAL HIGH (ref 70–99)
Potassium: 3.7 mmol/L (ref 3.5–5.1)
Sodium: 143 mmol/L (ref 135–145)
Total Bilirubin: 0.5 mg/dL (ref 0.3–1.2)
Total Protein: 5.1 g/dL — ABNORMAL LOW (ref 6.5–8.1)

## 2020-06-09 LAB — HIV ANTIBODY (ROUTINE TESTING W REFLEX): HIV Screen 4th Generation wRfx: NONREACTIVE

## 2020-06-09 LAB — D-DIMER, QUANTITATIVE: D-Dimer, Quant: 0.45 ug/mL-FEU (ref 0.00–0.50)

## 2020-06-09 LAB — SARS CORONAVIRUS 2 BY RT PCR (HOSPITAL ORDER, PERFORMED IN ~~LOC~~ HOSPITAL LAB): SARS Coronavirus 2: NEGATIVE

## 2020-06-09 SURGERY — ANTERIOR CERVICAL DECOMPRESSION/DISCECTOMY FUSION 2 LEVELS
Anesthesia: General

## 2020-06-09 MED ORDER — ENSURE ENLIVE PO LIQD: 237.0000 mL | Freq: Two times a day (BID) | ORAL | Status: DC

## 2020-06-09 MED ORDER — SODIUM CHLORIDE 0.9 % IV BOLUS
1000.0000 mL | Freq: Once | INTRAVENOUS | Status: AC
  Administered 2020-06-09: 1000 mL via INTRAVENOUS

## 2020-06-09 MED ORDER — PREDNISONE 20 MG PO TABS: 40.0000 mg | ORAL_TABLET | Freq: Every day | ORAL | Status: DC

## 2020-06-09 MED ORDER — HYDROXYZINE HCL 25 MG PO TABS
25.0000 mg | ORAL_TABLET | Freq: Two times a day (BID) | ORAL | Status: DC
  Administered 2020-06-09 – 2020-06-11 (×4): 25 mg via ORAL
  Filled 2020-06-09 (×4): qty 1

## 2020-06-09 MED ORDER — ENOXAPARIN SODIUM 40 MG/0.4ML ~~LOC~~ SOLN
40.0000 mg | SUBCUTANEOUS | Status: DC
  Administered 2020-06-09 – 2020-06-12 (×4): 40 mg via SUBCUTANEOUS
  Filled 2020-06-09 (×4): qty 0.4

## 2020-06-09 MED ORDER — CARBAMAZEPINE 200 MG PO TABS
400.0000 mg | ORAL_TABLET | Freq: Two times a day (BID) | ORAL | Status: DC
  Administered 2020-06-09 – 2020-06-12 (×7): 400 mg via ORAL
  Filled 2020-06-09 (×8): qty 2

## 2020-06-09 MED ORDER — ALBUTEROL (5 MG/ML) CONTINUOUS INHALATION SOLN
5.0000 mg/h | INHALATION_SOLUTION | RESPIRATORY_TRACT | Status: DC
  Administered 2020-06-09: 5 mg/h via RESPIRATORY_TRACT
  Filled 2020-06-09: qty 20

## 2020-06-09 MED ORDER — MOMETASONE FURO-FORMOTEROL FUM 200-5 MCG/ACT IN AERO
1.0000 | INHALATION_SPRAY | Freq: Two times a day (BID) | RESPIRATORY_TRACT | Status: DC
  Administered 2020-06-09 – 2020-06-12 (×8): 1 via RESPIRATORY_TRACT
  Filled 2020-06-09 (×2): qty 8.8

## 2020-06-09 MED ORDER — UMECLIDINIUM BROMIDE 62.5 MCG/INH IN AEPB
1.0000 | INHALATION_SPRAY | Freq: Every day | RESPIRATORY_TRACT | Status: DC
  Administered 2020-06-09: 1 via RESPIRATORY_TRACT
  Filled 2020-06-09 (×2): qty 7

## 2020-06-09 MED ORDER — ALBUTEROL (5 MG/ML) CONTINUOUS INHALATION SOLN
10.0000 mg/h | INHALATION_SOLUTION | Freq: Once | RESPIRATORY_TRACT | Status: AC
  Administered 2020-06-09: 10 mg/h via RESPIRATORY_TRACT
  Filled 2020-06-09: qty 20

## 2020-06-09 MED ORDER — LORATADINE 10 MG PO TABS
10.0000 mg | ORAL_TABLET | Freq: Every day | ORAL | Status: DC
  Administered 2020-06-09 – 2020-06-12 (×4): 10 mg via ORAL
  Filled 2020-06-09 (×4): qty 1

## 2020-06-09 MED ORDER — SODIUM CHLORIDE 0.9 % IV SOLN
500.0000 mg | INTRAVENOUS | Status: DC
  Administered 2020-06-09 – 2020-06-10 (×2): 500 mg via INTRAVENOUS
  Filled 2020-06-09 (×3): qty 500

## 2020-06-09 MED ORDER — HYDROCHLOROTHIAZIDE 25 MG PO TABS
25.0000 mg | ORAL_TABLET | Freq: Every day | ORAL | Status: DC
  Administered 2020-06-09 – 2020-06-11 (×3): 25 mg via ORAL
  Filled 2020-06-09 (×3): qty 1

## 2020-06-09 MED ORDER — PRAVASTATIN SODIUM 40 MG PO TABS
40.0000 mg | ORAL_TABLET | Freq: Every day | ORAL | Status: DC
  Administered 2020-06-09 – 2020-06-11 (×3): 40 mg via ORAL
  Filled 2020-06-09 (×2): qty 1

## 2020-06-09 MED ORDER — IPRATROPIUM-ALBUTEROL 0.5-2.5 (3) MG/3ML IN SOLN
3.0000 mL | RESPIRATORY_TRACT | Status: DC
  Administered 2020-06-09: 3 mL via RESPIRATORY_TRACT
  Filled 2020-06-09: qty 3

## 2020-06-09 MED ORDER — LORAZEPAM 2 MG/ML IJ SOLN
2.0000 mg | Freq: Once | INTRAMUSCULAR | Status: AC
  Administered 2020-06-09: 2 mg via INTRAVENOUS
  Filled 2020-06-09: qty 1

## 2020-06-09 MED ORDER — PANTOPRAZOLE SODIUM 40 MG PO TBEC
40.0000 mg | DELAYED_RELEASE_TABLET | Freq: Every day | ORAL | Status: DC
  Administered 2020-06-09 – 2020-06-12 (×4): 40 mg via ORAL
  Filled 2020-06-09 (×4): qty 1

## 2020-06-09 MED ORDER — LORAZEPAM 2 MG/ML IJ SOLN
2.0000 mg | Freq: Four times a day (QID) | INTRAMUSCULAR | Status: DC | PRN
  Administered 2020-06-10: 2 mg via INTRAVENOUS
  Filled 2020-06-09: qty 1

## 2020-06-09 MED ORDER — SODIUM CHLORIDE 0.9 % IV SOLN
1.0000 g | Freq: Every day | INTRAVENOUS | Status: DC
  Administered 2020-06-09 – 2020-06-11 (×3): 1 g via INTRAVENOUS
  Filled 2020-06-09 (×3): qty 10

## 2020-06-09 MED ORDER — DUPILUMAB 300 MG/2ML ~~LOC~~ SOSY: 300.0000 mg | PREFILLED_SYRINGE | SUBCUTANEOUS | Status: DC

## 2020-06-09 MED ORDER — LACTULOSE ENCEPHALOPATHY 10 GM/15ML PO SOLN: 30.0000 g | Freq: Two times a day (BID) | ORAL | Status: DC | PRN

## 2020-06-09 MED ORDER — IRBESARTAN-HYDROCHLOROTHIAZIDE 150-12.5 MG PO TABS: 1.0000 | ORAL_TABLET | Freq: Every day | ORAL | Status: DC

## 2020-06-09 MED ORDER — METHYLPREDNISOLONE SODIUM SUCC 125 MG IJ SOLR
60.0000 mg | Freq: Two times a day (BID) | INTRAMUSCULAR | Status: DC
  Administered 2020-06-09 – 2020-06-11 (×5): 60 mg via INTRAVENOUS
  Filled 2020-06-09 (×5): qty 2

## 2020-06-09 MED ORDER — SODIUM CHLORIDE 0.9 % IV SOLN: INTRAVENOUS | Status: DC

## 2020-06-09 MED ORDER — FAMOTIDINE 20 MG PO TABS
20.0000 mg | ORAL_TABLET | Freq: Two times a day (BID) | ORAL | Status: DC
  Administered 2020-06-09 – 2020-06-12 (×7): 20 mg via ORAL
  Filled 2020-06-09 (×7): qty 1

## 2020-06-09 MED ORDER — MONTELUKAST SODIUM 10 MG PO TABS
10.0000 mg | ORAL_TABLET | Freq: Every day | ORAL | Status: DC
  Administered 2020-06-09 – 2020-06-11 (×3): 10 mg via ORAL
  Filled 2020-06-09 (×4): qty 1

## 2020-06-09 MED ORDER — MECLIZINE HCL 25 MG PO TABS: 12.5000 mg | ORAL_TABLET | Freq: Three times a day (TID) | ORAL | Status: DC | PRN

## 2020-06-09 MED ORDER — HYDROCHLOROTHIAZIDE 12.5 MG PO CAPS: 12.5000 mg | ORAL_CAPSULE | Freq: Every day | ORAL | Status: DC

## 2020-06-09 MED ORDER — LEVOCETIRIZINE DIHYDROCHLORIDE 5 MG PO TABS: 5.0000 mg | ORAL_TABLET | Freq: Every day | ORAL | Status: DC

## 2020-06-09 MED ORDER — BIOTIN 1000 MCG PO TABS: 1000.0000 ug | ORAL_TABLET | Freq: Every day | ORAL | Status: DC

## 2020-06-09 MED ORDER — ALBUTEROL SULFATE (2.5 MG/3ML) 0.083% IN NEBU: 2.5000 mg | INHALATION_SOLUTION | RESPIRATORY_TRACT | Status: DC | PRN

## 2020-06-09 MED ORDER — IRBESARTAN 300 MG PO TABS
150.0000 mg | ORAL_TABLET | Freq: Every day | ORAL | Status: DC
  Administered 2020-06-09 – 2020-06-11 (×3): 150 mg via ORAL
  Filled 2020-06-09 (×3): qty 1

## 2020-06-09 MED ORDER — DICLOFENAC SODIUM 1 % EX GEL
4.0000 g | Freq: Four times a day (QID) | CUTANEOUS | Status: DC
  Administered 2020-06-09 – 2020-06-12 (×11): 4 g via TOPICAL
  Filled 2020-06-09 (×2): qty 100

## 2020-06-09 MED ORDER — DUPILUMAB 300 MG/2ML ~~LOC~~ SOSY
300.0000 mg | PREFILLED_SYRINGE | Freq: Once | SUBCUTANEOUS | Status: AC
  Administered 2020-06-09: 300 mg via SUBCUTANEOUS
  Filled 2020-06-09 (×2): qty 2

## 2020-06-09 MED ORDER — HYDROXYZINE HCL 25 MG PO TABS
50.0000 mg | ORAL_TABLET | Freq: Every day | ORAL | Status: DC
  Administered 2020-06-09 – 2020-06-11 (×3): 50 mg via ORAL
  Filled 2020-06-09 (×3): qty 2

## 2020-06-09 MED ORDER — METOPROLOL TARTRATE 5 MG/5ML IV SOLN
5.0000 mg | Freq: Four times a day (QID) | INTRAVENOUS | Status: DC
  Administered 2020-06-09 – 2020-06-10 (×3): 5 mg via INTRAVENOUS
  Filled 2020-06-09 (×3): qty 5

## 2020-06-09 MED ORDER — VITAMIN D 25 MCG (1000 UNIT) PO TABS
5000.0000 [IU] | ORAL_TABLET | Freq: Every day | ORAL | Status: DC
  Administered 2020-06-10 – 2020-06-12 (×3): 5000 [IU] via ORAL
  Filled 2020-06-09 (×3): qty 5

## 2020-06-09 MED ORDER — MAGNESIUM SULFATE IN D5W 1-5 GM/100ML-% IV SOLN
1.0000 g | Freq: Once | INTRAVENOUS | Status: AC
  Administered 2020-06-09: 1 g via INTRAVENOUS
  Filled 2020-06-09: qty 100

## 2020-06-09 MED ORDER — IPRATROPIUM BROMIDE 0.02 % IN SOLN
0.5000 mg | Freq: Once | RESPIRATORY_TRACT | Status: AC
  Administered 2020-06-09: 0.5 mg via RESPIRATORY_TRACT
  Filled 2020-06-09: qty 2.5

## 2020-06-09 MED ORDER — IPRATROPIUM-ALBUTEROL 0.5-2.5 (3) MG/3ML IN SOLN
3.0000 mL | Freq: Three times a day (TID) | RESPIRATORY_TRACT | Status: DC
  Administered 2020-06-10 – 2020-06-11 (×5): 3 mL via RESPIRATORY_TRACT
  Filled 2020-06-09 (×5): qty 3

## 2020-06-09 MED ORDER — FLUTICASONE PROPIONATE 50 MCG/ACT NA SUSP: 1.0000 | Freq: Every day | NASAL | Status: DC | PRN

## 2020-06-09 NOTE — ED Notes (Signed)
Pt off bipap, Will give meds. RT to reassess.

## 2020-06-09 NOTE — ED Notes (Signed)
Pt assisted back to bed and advised she and her significant other that she would not be allowed to ambulate to bathroom due to significant SOB. Pt agreed and will continue to monitor.

## 2020-06-09 NOTE — H&P (Signed)
History and Physical    [name]:[id number] DOB: 1961-10-02 DOA: 06/09/2020  PCP: Biagio Borg, MD  Patient coming from: Home  I have personally briefly reviewed patient's old medical records in [place]  Chief Complaint: SOB  HPI: [name] is a 59 y.o. female with medical history significant of asthma / COPD overlap, HTN.  Pt has cervical radiculopathy and was scheduled for ACDF with Dr. Arnoldo Morale today.  Pt with severe asthma history, is on numerous preventative meds, sees an asthma/allergy specialist, and is on [place] every other week.  She is actually due for her Dupixent shot today.  Pt has been having progressively worsening SOB for past few days.  Wheezing.  No fever, no productive cough.  Seen in ED on 6/19.  Started on prednisone.  Symptoms persisted and worsened.  In to ED with respiratory distress today.   ED Course: EMS gave solumedrol, mag, 10mg  albuterol, 0.5 atrovent.  In ED they gave another 10mg  albuterol, mag, 0.5 atrovent.  Despite this, pt still SOB, blowing just 50 on the FVC meter.  Now getting another 5mg  albuterol.  CXR neg.   Review of Systems: As per HPI, otherwise all review of systems negative.  Past Medical History:  Diagnosis Date  . ALLERGIC RHINITIS 05/12/2009   Qualifier: Diagnosis of  By: Lenn Cal Deborra Medina), Wynona Canes    . Anemia, iron deficiency 12/22/2014  . Angina   . Anxiety   . Arthritis   . Asthma   . ASTHMA 05/12/2009   Severe AFL (Spirometry 05/2009: pre-BD FEV1 0.87L 34% pred, post-BD FEV1 1.11L 44% pred) Volumes hyperinflated Decreased DLCO that does not fully correct to normal range for alveolar volume.     Marland Kitchen COPD 08/24/2009   Qualifier: Diagnosis of  By: Burnett Kanaris    . COPD (chronic obstructive pulmonary disease) ([place])   . Depression   . Fibromyalgia 05/14/2014  . GERD (gastroesophageal reflux disease)   . History of kidney stones   . Hyperlipidemia 04/20/2017  . HYPERTENSION 05/12/2009    Qualifier: Diagnosis of  By: Lenn Cal Deborra Medina), Wynona Canes    . Migraine   . Nephrolithiasis   . Peripheral vascular disease ([place])   . Prediabetes 02/23/2014  . Seizure ([place])   . Seizure ([place])   . Urticaria     Past Surgical History:  Procedure Laterality Date  . ABDOMINAL HYSTERECTOMY    . BACK SURGERY    . COLONOSCOPY  12/20/2011   Procedure: COLONOSCOPY;  Surgeon: Landry Dyke, MD;  Location: [facility];  Service: Endoscopy;  Laterality: N/A;  . COLONOSCOPY  03/05/2012   Procedure: COLONOSCOPY;  Surgeon: Landry Dyke, MD;  Location: [facility];  Service: Endoscopy;  Laterality: N/A;  . DIAGNOSTIC LAPAROSCOPY    . HEMORRHOID SURGERY    . INCISE AND DRAIN ABCESS    . KIDNEY STONE SURGERY    . NECK SURGERY     x 2 Dr Orinda Kenner  . SPINE SURGERY  2019  . TOE SURGERY    . TUBAL LIGATION       reports that she quit smoking about 16 months ago. Her smoking use included cigarettes. She has a 9.00 pack-year smoking history. She has never used smokeless tobacco. She reports previous alcohol use. She reports current drug use. Drug: Marijuana.  Allergies  Allergen Reactions  . Codeine Nausea And Vomiting  . Sulfa Antibiotics Nausea And Vomiting  . Sulfonamide Derivatives Nausea And Vomiting    REACTION: n/v  Family History  Problem Relation Age of Onset  . Diabetes Mother   . COPD Mother   . Heart disease Mother   . Asthma Mother   . Diabetes Father   . Kidney disease Father   . Cancer Father   . Anesthesia problems Father   . Alcohol abuse Father   . Diabetes Sister   . Hypertension Sister   . Diabetes Brother   . Sleep apnea Brother   . Asthma Brother   . Alcohol abuse Brother   . Heart disease Sister   . Diabetes Sister   . Brain cancer Sister   . Heart disease Brother   . Asthma Sister   . Allergic rhinitis Neg Hx   . Eczema Neg Hx   . Urticaria Neg Hx      Prior to Admission medications   Medication Sig Start Date End Date Taking? Authorizing  Provider  albuterol (PROAIR HFA) 108 (90 Base) MCG/ACT inhaler Inhale 4 puffs into the lungs every 4 (four) hours as needed for wheezing or shortness of breath. Patient taking differently: Inhale 2 puffs into the lungs daily.  05/12/20   Valentina Shaggy, MD  albuterol (PROVENTIL) (2.5 MG/3ML) 0.083% nebulizer solution Take 3 mLs (2.5 mg total) by nebulization every 4 (four) hours as needed for wheezing or shortness of breath. Reported on 06/04/2016 Patient taking differently: Take 2.5 mg by nebulization every 4 (four) hours as needed for wheezing or shortness of breath.  12/23/18   Valentina Shaggy, MD  Aspirin-Caffeine [facility] FAST PAIN RELIEF ARTHRITIS) 1000-65 MG PACK Take 1 Package by mouth daily as needed (Knee pain).    [provider]  Aspirin-Salicylamide-Caffeine (BC HEADACHE POWDER PO) Take 1 Package by mouth daily as needed (Headache).    [provider]  Biotin 1000 MCG tablet Take 1,000 mcg by mouth daily.     [provider]  Budeson-Glycopyrrol-Formoterol (BREZTRI AEROSPHERE) 160-9-4.8 MCG/ACT AERO Inhale 2 puffs into the lungs 2 (two) times daily. Use with spacer. Patient taking differently: Inhale 2 puffs into the lungs daily. Use with spacer. 05/12/20   Valentina Shaggy, MD  budesonide-formoterol [facility]) 160-4.5 MCG/ACT inhaler Inhale 2 puffs into the lungs daily.    [provider]  carbamazepine (TEGRETOL) 200 MG tablet Take 2 tablets (400 mg total) by mouth 2 (two) times daily. 06/01/20   Plovsky, Berneta Sages, MD  Cholecalciferol (VITAMIN D) 125 MCG (5000 UT) CAPS Take 5,000 Units by mouth daily.    [provider]  clindamycin (CLINDAGEL) 1 % gel Apply topically daily as needed. Patient taking differently: Apply 1 application topically daily as needed.  03/10/20   Princess Bruins, MD  diclofenac Sodium (VOLTAREN) 1 % GEL Apply 4 g topically 4 (four) times daily. 06/04/20   Deno Etienne, DO  DUPIXENT 300 MG/2ML prefilled syringe  INJECT 300MG  (1 SYRINGE) SUBCUTANEOUSLY EVERY 14 DAYS Patient taking differently: Inject 300 mg into the skin every 14 (fourteen) days.  02/29/20   Valentina Shaggy, MD  estradiol (ESTRACE) 0.5 MG tablet Take 1 tablet (0.5 mg total) by mouth daily. 04/28/19   Princess Bruins, MD  famotidine (PEPCID) 20 MG tablet Take 1 tablet (20 mg total) by mouth 2 (two) times daily. 05/12/20   Valentina Shaggy, MD  fluticasone [facility]) 50 MCG/ACT nasal spray Place 1 spray into both nostrils 2 (two) times daily. Patient taking differently: Place 1 spray into both nostrils daily as needed for allergies.  05/12/20   Valentina Shaggy, MD  hydrochlorothiazide (MICROZIDE) 12.5 MG capsule Take 1 capsule (12.5 mg total) by mouth daily. 11/04/19   Biagio Borg, MD  hydrOXYzine (VISTARIL) 25 MG capsule Take 1 capsule (25 mg total) by mouth See admin instructions. 25 mg morning ,  25 mg at noon, 50 mg at bedtime 06/01/20   Plovsky, Berneta Sages, MD  irbesartan-hydrochlorothiazide (AVALIDE) 150-12.5 MG tablet Take 1 tablet by mouth daily. 11/04/19   Biagio Borg, MD  lactulose, encephalopathy, (GENERLAC) 10 GM/15ML SOLN TAKE 45 TO 90 ML( 30-60 GM) BY MOUTH TWICE DAILY AS NEEDED Patient taking differently: Take 30-60 g by mouth See admin instructions. TAKE 45 TO 90 ML( 30-60 GM) BY MOUTH TWICE DAILY AS NEEDED 03/20/19   Biagio Borg, MD  levocetirizine (XYZAL) 5 MG tablet Take 1 tablet (5 mg total) by mouth 2 (two) times daily. Patient taking differently: Take 5 mg by mouth daily.  05/12/20   Valentina Shaggy, MD  meclizine (ANTIVERT) 12.5 MG tablet TAKE 1 TABLET(12.5 MG) BY MOUTH THREE TIMES DAILY AS NEEDED FOR DIZZINESS Patient taking differently: Take 12.5 mg by mouth 3 (three) times daily as needed for dizziness.  04/07/19   Biagio Borg, MD  montelukast (SINGULAIR) 10 MG tablet Take 1 tablet (10 mg total) by mouth at bedtime. 03/30/20   Martyn Ehrich, NP  pantoprazole (PROTONIX) 40 MG tablet 1 by mouth  once daily Patient taking differently: Take 40 mg by mouth daily.  03/07/20   Martyn Ehrich, NP  pravastatin (PRAVACHOL) 40 MG tablet TAKE 1 TABLET(40 MG) BY MOUTH DAILY Patient taking differently: Take 40 mg by mouth daily.  11/04/19   Biagio Borg, MD  predniSONE (DELTASONE) 20 MG tablet 2 tabs po daily x 4 days 06/04/20   Deno Etienne, DO  Tiotropium Bromide Monohydrate (SPIRIVA RESPIMAT) 2.5 MCG/ACT AERS Inhale 2 puffs into the lungs daily. 01/06/20   Collene Gobble, MD  triamcinolone cream (KENALOG) 0.1 % APPLY EXTERNALLY TO THE AFFECTED AREA TWICE DAILY Patient taking differently: Apply 1 application topically daily as needed (Rash).  04/14/20   Biagio Borg, MD  vitamin C (ASCORBIC ACID) 500 MG tablet Take 500 mg by mouth daily.     [provider]  vitamin E 180 MG (400 UNITS) capsule Take 400 Units by mouth daily.    [provider]    Physical Exam: Vitals:   06/09/20 0100 06/09/20 0118 06/09/20 0300 06/09/20 0400  BP:   (!) 156/83 (!) 135/95  Pulse:   93 74  Resp:   (!) 23 (!) 21  Temp:      TempSrc:      SpO2: 100% 100% 95% 95%  Weight:      Height:        Constitutional: NAD, calm, comfortable Eyes: PERRL, lids and conjunctivae normal ENMT: Mucous membranes are moist. Posterior pharynx clear of any exudate or lesions.Normal dentition.  Neck: normal, supple, no masses, no thyromegaly Respiratory: clear to auscultation bilaterally, no wheezing, no crackles. Normal respiratory effort. No accessory muscle use.  Cardiovascular: Regular rate and rhythm, no murmurs / rubs / gallops. No extremity edema. 2+ pedal pulses. No carotid bruits.  Abdomen: no tenderness, no masses palpated. No hepatosplenomegaly. Bowel sounds positive.  Musculoskeletal: no clubbing / cyanosis. No joint deformity upper and lower extremities. Good ROM, no contractures. Normal muscle tone.  Skin: no rashes, lesions, ulcers. No induration Neurologic: CN 2-12 grossly intact. Sensation  intact, DTR normal. Strength 5/5 in all 4.  Psychiatric:  Normal judgment and insight. Alert and oriented x 3. Normal mood.    Labs on Admission: I have personally reviewed following labs and imaging studies  CBC: Recent Labs  Lab 06/03/20 2342 06/07/20 1002 06/09/20 0045 06/09/20 0120  WBC 13.4* 17.8* 16.5*  --   NEUTROABS  --   --  9.2*  --   HGB 12.6 11.2* 10.0* 10.9*  HCT 39.3 36.1 32.0* 32.0*  MCV 89.5 88.7 91.4  --   PLT 431* 394 340  --    Basic Metabolic Panel: Recent Labs  Lab 06/03/20 2342 06/07/20 1002 06/09/20 0045 06/09/20 0120  NA 139 140 143 142  K 4.0 4.5 3.7 3.5  CL 103 104 109  --   CO2 24 27 26   --   GLUCOSE 137* 123* 118*  --   BUN 10 17 11   --   CREATININE 0.73 0.67 0.65  --   CALCIUM 9.0 9.2 8.0*  --    GFR: Estimated Creatinine Clearance: 72.3 mL/min (by C-G formula based on SCr of 0.65 mg/dL). Liver Function Tests: Recent Labs  Lab 06/07/20 1002 06/09/20 0045  AST 28 21  ALT 48* 33  ALKPHOS 74 59  BILITOT 0.5 0.5  PROT 6.3* 5.1*  ALBUMIN 3.8 3.1*   No results for input(s): LIPASE, AMYLASE in the last 168 hours. No results for input(s): AMMONIA in the last 168 hours. Coagulation Profile: No results for input(s): INR, PROTIME in the last 168 hours. Cardiac Enzymes: No results for input(s): CKTOTAL, CKMB, CKMBINDEX, TROPONINI in the last 168 hours. BNP (last 3 results) No results for input(s): PROBNP in the last 8760 hours. HbA1C: No results for input(s): HGBA1C in the last 72 hours. CBG: No results for input(s): GLUCAP in the last 168 hours. Lipid Profile: No results for input(s): CHOL, HDL, LDLCALC, TRIG, CHOLHDL, LDLDIRECT in the last 72 hours. Thyroid Function Tests: No results for input(s): TSH, T4TOTAL, FREET4, T3FREE, THYROIDAB in the last 72 hours. Anemia Panel: No results for input(s): VITAMINB12, FOLATE, FERRITIN, TIBC, IRON, RETICCTPCT in the last 72 hours. Urine analysis:    Component Value Date/Time   COLORURINE  YELLOW 08/12/2019 1500   APPEARANCEUR CLEAR 08/12/2019 1500   LABSPEC 1.020 08/12/2019 1500   PHURINE 6.0 08/12/2019 1500   GLUCOSEU NEGATIVE 08/12/2019 1500   HGBUR NEGATIVE 08/12/2019 1500   BILIRUBINUR NEGATIVE 08/12/2019 1500   KETONESUR NEGATIVE 08/12/2019 1500   PROTEINUR NEG 11/02/2014 1657   UROBILINOGEN 0.2 08/12/2019 1500   NITRITE NEGATIVE 08/12/2019 1500   LEUKOCYTESUR NEGATIVE 08/12/2019 1500    Radiological Exams on Admission: DG Chest Port 1 View  Result Date: 06/09/2020 CLINICAL DATA:  Shortness of breath EXAM: PORTABLE CHEST 1 VIEW COMPARISON:  06/03/2020 FINDINGS: The heart size and mediastinal contours are within normal limits. Both lungs are clear. The visualized skeletal structures are unremarkable. IMPRESSION: No active disease. Electronically Signed   By: Ulyses Jarred M.D.   On: 06/09/2020 01:00    EKG: Independently reviewed.  Assessment/Plan Principal Problem:   Status asthmaticus Active Problems:   HTN (hypertension)   COPD/asthma   Bipolar I disorder (HCC)   Cervical radiculopathy    1. Status asthmaticus - 1. Very severe: despite 20mg  albuterol 1 of atrovent, 2 of Mg and 125 of solumedrol: pt FVC is 50 still! 2. COPD pathway 3. Solumedrol 60 BID 4. Adult wheeze 5. LABA, LAMA, and INH steroid 6. Singulair 7. Will add rocephin 8. If this neb treatment fails to alleviate symptoms: next step will  be BIPAP and call PCCM for formal consult. 2. COPD / Asthma - 1. Will continue her Dupixent, she is due for her routine dose of this today, she probably will call family member to bring it in to her (I suspect our pharmacy may not carry this med). 3. Cervical radiculopathy - 1. Sent message to Dr. Arnoldo Morale that pt was getting admitted for asthma and wouldn't be going to OR today. 4. HTN - Cont home BP meds 5. BPD1 - cont tegretol  DVT prophylaxis: Lovenox Code Status: Full Family Communication: No family in room Disposition Plan: Home after asthma  exacerbation stabilized Consults called: Messaged Dr. Lucile Shutters PCCM to put pt on their radar, also sent a message to Dr. Arnoldo Morale to let him know pt wouldn't be making surgery today. Admission status: Place in obs     Felicite [place], [place] Hospital[facility]  How to contact the [facility] Attending or Consulting provider [place] or covering provider during after hours [place], for this patient?  1. Check the care team in [facility] and look for a) attending/consulting [facility] provider listed and b) the [facility] team listed 2. Log into [email or web address]  Amion Physician Scheduling and messaging for groups and whole hospitals  On call and physician scheduling software for group practices, residents, hospitalists and other medical providers for call, clinic, rotation and shift schedules. On[employer] is a hospital-wide system for scheduling doctors and paging doctors on call. EasyPlot is for scientific plotting and data analysis.  [email or web address]  and use [place] universal password to access. If you do not have the password, please contact the hospital operator.  3. Locate the [facility] provider you are looking for under [facility] and page to a number that you can be directly reached. 4. If you still have difficulty reaching the provider, please page the [facility] (Director on Call) for the Hospitalists listed on amion for assistance.  06/09/2020, 5:08 AM

## 2020-06-09 NOTE — ED Notes (Signed)
Pt doing well off bi pap, ate lunch and is resting at this time.

## 2020-06-09 NOTE — Progress Notes (Signed)
Patient's peak flow is 50 l/m. This is patient's best effort out of several attempts.

## 2020-06-09 NOTE — Progress Notes (Signed)
Patient seems to be doing much better on BIPAP for the moment.

## 2020-06-09 NOTE — ED Notes (Signed)
Gave pt warm blankets and repositioned in the bed.

## 2020-06-09 NOTE — ED Provider Notes (Signed)
[facility] EMERGENCY DEPARTMENT Provider Note  CSN: [id number] Arrival date & time: 06/09/20 0041  Chief Complaint(s) Shortness of Breath  HPI [name] is a 59 y.o. female here for SOB  HPI  Onset/Duration: 2-3 days ago, gradual Timing: constant, worsening Severity: now severe Modifying Factors:  Improved by: Duoneb and CPAP provided by EMS. Patient tried albuterol puffs at home w/o relief  Worsened by: activity and coughing Associated Signs/Symptoms:  Pertinent (+): dry cough  Pertinent (-): Fevers, chest pain, productive cough, n/v Context: no known sick contacts.  Patient also received 125mg  solumedrol and 1g Mag by EMS.  Past Medical History Past Medical History:  Diagnosis Date  . ALLERGIC RHINITIS 05/12/2009   Qualifier: Diagnosis of  By: Lenn Cal Deborra Medina), Wynona Canes    . Anemia, iron deficiency 12/22/2014  . Angina   . Anxiety   . Arthritis   . Asthma   . ASTHMA 05/12/2009   Severe AFL (Spirometry 05/2009: pre-BD FEV1 0.87L 34% pred, post-BD FEV1 1.11L 44% pred) Volumes hyperinflated Decreased DLCO that does not fully correct to normal range for alveolar volume.     Marland Kitchen COPD 08/24/2009   Qualifier: Diagnosis of  By: Burnett Kanaris    . COPD (chronic obstructive pulmonary disease) ([place])   . Depression   . Fibromyalgia 05/14/2014  . GERD (gastroesophageal reflux disease)   . History of kidney stones   . Hyperlipidemia 04/20/2017  . HYPERTENSION 05/12/2009   Qualifier: Diagnosis of  By: Lenn Cal Deborra Medina), Wynona Canes    . Migraine   . Nephrolithiasis   . Peripheral vascular disease ([place])   . Prediabetes 02/23/2014  . Seizure ([place])   . Seizure ([place])   . Urticaria    Patient Active Problem List   Diagnosis Date Noted  . Status asthmaticus 06/09/2020  . Leg cramping 08/12/2019  . Hypophosphatemia 01/07/2019  . Vertigo 09/23/2018  . Dysfunction of left eustachian tube 09/23/2018  . Gait disorder 04/21/2018  . Leukocytosis 04/15/2018  . Nausea  & vomiting 04/15/2018  . Seizure ([place])   . Cervical radiculopathy 02/18/2018  . 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 Syncope 09/04/2017  . Constipation 09/04/2017  . Nonintractable headache 09/04/2017  . Leg swelling 07/26/2017  . 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 Hyperlipidemia 04/20/2017  . Pedal edema 04/19/2017  . Bipolar I disorder ([place]) 09/19/2016  . Facial pain 07/12/2016  . Rash 06/14/2016  . Migraine 01/25/2016  . Idiopathic urticaria/pruritus 01/23/2016  . Perennial allergic rhinitis 01/23/2016  . Oral candidiasis 01/23/2016  . Greater trochanteric bursitis of both hips 09/08/2015  . Chest pain 06/22/2015  . Itching 06/22/2015  . Bilateral knee pain 06/22/2015  . Bilateral hip pain 06/22/2015  . Anemia, iron deficiency 12/22/2014  . Fatigue 12/21/2014  . Intertrigo 08/07/2014  . Dyspareunia 07/12/2014  . Menopausal syndrome (hot flushes) 07/12/2014  . History of tobacco abuse 07/12/2014  . Dizziness 06/22/2014  . Weakness 06/22/2014  . Fibromyalgia 05/14/2014  . Preventative health care 04/22/2014  . Recurrent boils 04/22/2014  . Recurrent falls 04/22/2014  . Peripheral vascular disease ([place])   . Depression   . Anxiety   . GERD (gastroesophageal reflux disease)   . Prediabetes 02/23/2014  . Screening mammogram for high-risk patient 02/23/2014  . Back pain 07/22/2013  . COPD/asthma 08/24/2009  . Headache(784.0) 08/24/2009  . HTN (hypertension) 05/12/2009   Home Medication(s) Prior to Admission medications   Medication Sig Start Date End Date Taking? Authorizing Provider  albuterol [facility] HFA) 108 (90 Base)  MCG/ACT inhaler Inhale 4 puffs into the lungs every 4 (four) hours as needed for wheezing or shortness of breath. Patient taking differently: Inhale 2 puffs into the lungs daily.  05/12/20   Valentina Shaggy, MD  albuterol (PROVENTIL) (2.5 MG/3ML) 0.083% nebulizer solution Take 3 mLs (2.5 mg total) by nebulization every 4 (four) hours as  needed for wheezing or shortness of breath. Reported on 06/04/2016 Patient taking differently: Take 2.5 mg by nebulization every 4 (four) hours as needed for wheezing or shortness of breath.  12/23/18   Valentina Shaggy, MD  Aspirin-Caffeine [facility] FAST PAIN RELIEF ARTHRITIS) 1000-65 MG PACK Take 1 Package by mouth daily as needed (Knee pain).    [provider]  Aspirin-Salicylamide-Caffeine (BC HEADACHE POWDER PO) Take 1 Package by mouth daily as needed (Headache).    [provider]  Biotin 1000 MCG tablet Take 1,000 mcg by mouth daily.     [provider]  Budeson-Glycopyrrol-Formoterol (BREZTRI AEROSPHERE) 160-9-4.8 MCG/ACT AERO Inhale 2 puffs into the lungs 2 (two) times daily. Use with spacer. Patient taking differently: Inhale 2 puffs into the lungs daily. Use with spacer. 05/12/20   Valentina Shaggy, MD  budesonide-formoterol [facility]) 160-4.5 MCG/ACT inhaler Inhale 2 puffs into the lungs daily.    [provider]  carbamazepine (TEGRETOL) 200 MG tablet Take 2 tablets (400 mg total) by mouth 2 (two) times daily. 06/01/20   Plovsky, Berneta Sages, MD  Cholecalciferol (VITAMIN D) 125 MCG (5000 UT) CAPS Take 5,000 Units by mouth daily.    [provider]  clindamycin (CLINDAGEL) 1 % gel Apply topically daily as needed. Patient taking differently: Apply 1 application topically daily as needed.  03/10/20   Princess Bruins, MD  diclofenac Sodium (VOLTAREN) 1 % GEL Apply 4 g topically 4 (four) times daily. 06/04/20   Deno Etienne, DO  DUPIXENT 300 MG/2ML prefilled syringe INJECT 300MG  (1 SYRINGE) SUBCUTANEOUSLY EVERY 14 DAYS Patient taking differently: Inject 300 mg into the skin every 14 (fourteen) days.  02/29/20   Valentina Shaggy, MD  estradiol (ESTRACE) 0.5 MG tablet Take 1 tablet (0.5 mg total) by mouth daily. 04/28/19   Princess Bruins, MD  famotidine (PEPCID) 20 MG tablet Take 1 tablet (20 mg total) by mouth 2 (two) times daily. 05/12/20    Valentina Shaggy, MD  fluticasone [facility]) 50 MCG/ACT nasal spray Place 1 spray into both nostrils 2 (two) times daily. Patient taking differently: Place 1 spray into both nostrils daily as needed for allergies.  05/12/20   Valentina Shaggy, MD  hydrochlorothiazide (MICROZIDE) 12.5 MG capsule Take 1 capsule (12.5 mg total) by mouth daily. 11/04/19   Biagio Borg, MD  hydrOXYzine (VISTARIL) 25 MG capsule Take 1 capsule (25 mg total) by mouth See admin instructions. 25 mg morning ,  25 mg at noon, 50 mg at bedtime 06/01/20   Plovsky, Berneta Sages, MD  irbesartan-hydrochlorothiazide (AVALIDE) 150-12.5 MG tablet Take 1 tablet by mouth daily. 11/04/19   Biagio Borg, MD  lactulose, encephalopathy, (GENERLAC) 10 GM/15ML SOLN TAKE 45 TO 90 ML( 30-60 GM) BY MOUTH TWICE DAILY AS NEEDED Patient taking differently: Take 30-60 g by mouth See admin instructions. TAKE 45 TO 90 ML( 30-60 GM) BY MOUTH TWICE DAILY AS NEEDED 03/20/19   Biagio Borg, MD  levocetirizine (XYZAL) 5 MG tablet Take 1 tablet (5 mg total) by mouth 2 (two) times daily. Patient taking differently: Take 5 mg by mouth daily.  05/12/20   Valentina Shaggy, MD  meclizine (ANTIVERT) 12.5 MG tablet TAKE 1 TABLET(12.5 MG) BY MOUTH THREE TIMES DAILY AS NEEDED FOR DIZZINESS Patient taking differently: Take 12.5 mg by mouth 3 (three) times daily as needed for dizziness.  04/07/19   Biagio Borg, MD  montelukast (SINGULAIR) 10 MG tablet Take 1 tablet (10 mg total) by mouth at bedtime. 03/30/20   Martyn Ehrich, NP  pantoprazole (PROTONIX) 40 MG tablet 1 by mouth once daily Patient taking differently: Take 40 mg by mouth daily.  03/07/20   Martyn Ehrich, NP  pravastatin (PRAVACHOL) 40 MG tablet TAKE 1 TABLET(40 MG) BY MOUTH DAILY Patient taking differently: Take 40 mg by mouth daily.  11/04/19   Biagio Borg, MD  predniSONE (DELTASONE) 20 MG tablet 2 tabs po daily x 4 days 06/04/20   Deno Etienne, DO  Tiotropium Bromide Monohydrate (SPIRIVA  RESPIMAT) 2.5 MCG/ACT AERS Inhale 2 puffs into the lungs daily. 01/06/20   Collene Gobble, MD  triamcinolone cream (KENALOG) 0.1 % APPLY EXTERNALLY TO THE AFFECTED AREA TWICE DAILY Patient taking differently: Apply 1 application topically daily as needed (Rash).  04/14/20   Biagio Borg, MD  vitamin C (ASCORBIC ACID) 500 MG tablet Take 500 mg by mouth daily.     [provider]  vitamin E 180 MG (400 UNITS) capsule Take 400 Units by mouth daily.    [provider]                                                                                                                                    Past Surgical History Past Surgical History:  Procedure Laterality Date  . ABDOMINAL HYSTERECTOMY    . BACK SURGERY    . COLONOSCOPY  12/20/2011   Procedure: COLONOSCOPY;  Surgeon: Landry Dyke, MD;  Location: [facility] ENDOSCOPY;  Service: Endoscopy;  Laterality: N/A;  . COLONOSCOPY  03/05/2012   Procedure: COLONOSCOPY;  Surgeon: Landry Dyke, MD;  Location: [facility] ENDOSCOPY;  Service: Endoscopy;  Laterality: N/A;  . DIAGNOSTIC LAPAROSCOPY    . HEMORRHOID SURGERY    . INCISE AND DRAIN ABCESS    . KIDNEY STONE SURGERY    . NECK SURGERY     x 2 Dr Orinda Kenner  . SPINE SURGERY  2019  . TOE SURGERY    . TUBAL LIGATION     Family History Family History  Problem Relation Age of Onset  . Diabetes Mother   . COPD Mother   . Heart disease Mother   . Asthma Mother   . Diabetes Father   . Kidney disease Father   . Cancer Father   . Anesthesia problems Father   . Alcohol abuse Father   . Diabetes Sister   . Hypertension Sister   . Diabetes Brother   . Sleep apnea Brother   . Asthma Brother   . Alcohol abuse Brother   . Heart disease Sister   .  Diabetes Sister   . Brain cancer Sister   . Heart disease Brother   . Asthma Sister   . Allergic rhinitis Neg Hx   . Eczema Neg Hx   . Urticaria Neg Hx     Social History Social History   Tobacco Use  . Smoking status: Former Smoker     Packs/day: 0.25    Years: 36.00    Pack years: 9.00    Types: Cigarettes    Quit date: 01/21/2019    Years since quitting: 1.3  . Smokeless tobacco: Never Used  . Tobacco comment: Passive smoker.  Patient states her quit date was 05/09/2018.  Patient educated with resources at today's appointment to continue to support her to stop smoking.  Vaping Use  . Vaping Use: Never used  Substance Use Topics  . Alcohol use: Not Currently    Alcohol/week: 0.0 standard drinks  . Drug use: Yes    Types: Marijuana    Comment: TWICE A MONTH, 12/28/19 "every now and then"    Allergies Codeine, Sulfa antibiotics, and Sulfonamide derivatives  Review of Systems Review of Systems All other systems are reviewed and are negative for acute change except as noted in the HPI  Physical Exam Vital Signs  I have reviewed the triage vital signs BP (!) 156/83   Pulse 93   Temp 98.4 F (36.9 C) (Oral)   Resp (!) 23   Ht 5\' 3"  (1.6 m)   Wt 68.9 kg   SpO2 95%   BMI 26.93 kg/m   Physical Exam Vitals reviewed.  Constitutional:      General: She is not in acute distress.    Appearance: She is well-developed. She is not diaphoretic.  HENT:     Head: Normocephalic and atraumatic.     Nose: Nose normal.  Eyes:     General: No scleral icterus.       Right eye: No discharge.        Left eye: No discharge.     Conjunctiva/sclera: Conjunctivae normal.     Pupils: Pupils are equal, round, and reactive to light.  Cardiovascular:     Rate and Rhythm: Normal rate and regular rhythm.     Heart sounds: No murmur heard.  No friction rub. No gallop.   Pulmonary:     Effort: Tachypnea, prolonged expiration and respiratory distress present.     Breath sounds: No stridor. Wheezing (diffuse insp and exp) present. No rales.  Abdominal:     General: There is no distension.     Palpations: Abdomen is soft.     Tenderness: There is no abdominal tenderness.  Musculoskeletal:        General: No tenderness.      Cervical back: Normal range of motion and neck supple.  Skin:    General: Skin is warm and dry.     Findings: No erythema or rash.  Neurological:     Mental Status: She is alert and oriented to person, place, and time.     ED Results and Treatments Labs (all labs ordered are listed, but only abnormal results are displayed) Labs Reviewed  CBC WITH DIFFERENTIAL/PLATELET - Abnormal; Notable for the following components:      Result Value   WBC 16.5 (*)    RBC 3.50 (*)    Hemoglobin 10.0 (*)    HCT 32.0 (*)    Neutro Abs 9.2 (*)    Lymphs Abs 6.3 (*)    Basophils Absolute 0.2 (*)  All other components within normal limits  COMPREHENSIVE METABOLIC PANEL - Abnormal; Notable for the following components:   Glucose, Bld 118 (*)    Calcium 8.0 (*)    Total Protein 5.1 (*)    Albumin 3.1 (*)    All other components within normal limits  I-STAT ARTERIAL BLOOD GAS, ED - Abnormal; Notable for the following components:   pCO2 arterial 53.5 (*)    pO2, Arterial 215 (*)    Bicarbonate 31.5 (*)    TCO2 33 (*)    Acid-Base Excess 5.0 (*)    HCT 32.0 (*)    Hemoglobin 10.9 (*)    All other components within normal limits  SARS CORONAVIRUS 2 BY RT PCR (HOSPITAL ORDER, [place] [facility])  HIV ANTIBODY (ROUTINE TESTING W REFLEX)  D-DIMER, QUANTITATIVE (NOT AT [facility])                                                                                                                         EKG  EKG Interpretation  Date/Time:    Ventricular Rate:    PR Interval:    QRS Duration:   QT Interval:    QTC Calculation:   R Axis:     Text Interpretation:        Radiology DG Chest Port 1 View  Result Date: 06/09/2020 CLINICAL DATA:  Shortness of breath EXAM: PORTABLE CHEST 1 VIEW COMPARISON:  06/03/2020 FINDINGS: The heart size and mediastinal contours are within normal limits. Both lungs are clear. The visualized skeletal structures are unremarkable. IMPRESSION: No active  disease. Electronically Signed   By: Ulyses Jarred M.D.   On: 06/09/2020 01:00    Pertinent labs & imaging results that were available during my care of the patient were reviewed by me and considered in my medical decision making (see chart for details).  Medications Ordered in ED Medications  sodium chloride 0.9 % bolus 1,000 mL (0 mLs Intravenous Stopped 06/09/20 0223)    And  0.9 %  sodium chloride infusion ( Intravenous New Bag/Given 06/09/20 0229)  albuterol (PROVENTIL,VENTOLIN) solution continuous neb (5 mg/hr Nebulization New Bag/Given 06/09/20 0444)  enoxaparin (LOVENOX) injection 40 mg (has no administration in time range)  cefTRIAXone (ROCEPHIN) 1 g in sodium chloride 0.9 % 100 mL IVPB (has no administration in time range)  predniSONE (DELTASONE) tablet 40 mg (has no administration in time range)  mometasone-formoterol (DULERA) 200-5 MCG/ACT inhaler 1 puff (has no administration in time range)  albuterol (PROVENTIL) (2.5 MG/3ML) 0.083% nebulizer solution 2.5 mg (has no administration in time range)  umeclidinium bromide (INCRUSE ELLIPTA) 62.5 MCG/INH 1 puff (has no administration in time range)  montelukast (SINGULAIR) tablet 10 mg (has no administration in time range)  lactulose (encephalopathy) (CHRONULAC) 10 GM/15ML solution 30-60 g (has no administration in time range)  famotidine (PEPCID) tablet 20 mg (has no administration in time range)  fluticasone (FLONASE) 50 MCG/ACT nasal spray 1 spray (has no administration in time range)  hydrOXYzine (ATARAX/VISTARIL) tablet 25 mg (has no administration in time range)  carbamazepine (TEGRETOL) tablet 400 mg (has no administration in time range)  pantoprazole (PROTONIX) EC tablet 40 mg (has no administration in time range)  pravastatin (PRAVACHOL) tablet 40 mg (has no administration in time range)  meclizine (ANTIVERT) tablet 12.5 mg (has no administration in time range)  loratadine (CLARITIN) tablet 10 mg (has no administration in time  range)  irbesartan (AVAPRO) tablet 150 mg (has no administration in time range)    And  hydrochlorothiazide (HYDRODIURIL) tablet 25 mg (has no administration in time range)  diclofenac Sodium (VOLTAREN) 1 % topical gel 4 g (has no administration in time range)  cholecalciferol (VITAMIN D3) tablet 5,000 Units (has no administration in time range)  hydrOXYzine (ATARAX/VISTARIL) tablet 50 mg (has no administration in time range)  albuterol (PROVENTIL,VENTOLIN) solution continuous neb (10 mg/hr Nebulization Given 06/09/20 0114)  ipratropium (ATROVENT) nebulizer solution 0.5 mg (0.5 mg Nebulization Given 06/09/20 0114)  magnesium sulfate IVPB 1 g 100 mL (0 g Intravenous Stopped 06/09/20 0223)                                                                                                                                    Procedures .Critical Care Performed by: Fatima Blank, MD Authorized by: Fatima Blank, MD    CRITICAL CARE Performed by: Grayce Sessions Kavaughn Faucett Total critical care time: 45 minutes Critical care time was exclusive of separately billable procedures and treating other patients. Critical care was necessary to treat or prevent imminent or life-threatening deterioration. Critical care was time spent personally by me on the following activities: development of treatment plan with patient and/or surrogate as well as nursing, discussions with consultants, evaluation of patient's response to treatment, examination of patient, obtaining history from patient or surrogate, ordering and performing treatments and interventions, ordering and review of laboratory studies, ordering and review of radiographic studies, pulse oximetry and re-evaluation of patient's condition.    (including critical care time)  Medical Decision Making / ED Course I have reviewed the nursing notes for this encounter and the patient's prior records (if available in EHR or on provided  paperwork).   [name] was evaluated in Emergency Department on 06/09/2020 for the symptoms described in the history of present illness. She was evaluated in the context of the global COVID-19 pandemic, which necessitated consideration that the patient might be at risk for infection with the SARS-CoV-2 virus that causes COVID-19. Institutional protocols and algorithms that pertain to the evaluation of patients at risk for COVID-19 are in a state of rapid change based on information released by regulatory bodies including the CDC and federal and state organizations. These policies and algorithms were followed during the patient's care in the ED.  Clinical Course as of Jun 09 445  Thu Jun 09, 2020  0057 SOB Consistent with Asthma/COPD exacerbation. No volume overload on exam concerning  for HF. Gradual onset, doubt PE or PTx. No infectious symptoms. Doubt PNA, but will rule out with CXR.   Will continue treatment with DUONeb and additional 1g Mag.   [PC]  0149 ABG reassuring with compensated resp acidosis. CBC with leukocytosis - likely from recent steroid use. No renal insufficieny. CXR negative.    [PC]  3833 On reassessment, patient reported improved WOB but noted to be pursed lip breathing and tachypneic.   On auscultation, she had decreased air movement throughout. Placed on CAP again.  Will call for admission   [PC]    Clinical Course User Index [PC] Kierre Hintz, Grayce Sessions, MD     Final Clinical Impression(s) / ED Diagnoses Final diagnoses:  Severe persistent asthma with exacerbation      This chart was dictated using voice recognition software.  Despite best efforts to proofread,  errors can occur which can change the documentation meaning.   Fatima Blank, MD 06/09/20 [phone]

## 2020-06-09 NOTE — Progress Notes (Addendum)
PROGRESS NOTE    [name]  XFG:[id number] DOB: May 17, 1961 DOA: 06/09/2020 PCP: Biagio Borg, MD     Brief Narrative:  59 y.o. BF PMHx seizure, anemia, bipolar DO, depression, fibromyalgia, HTN, PVD, asthma / COPD overlap, HTN.  Prediabetes.  Pt has cervical radiculopathy and was scheduled for ACDF with Dr. Arnoldo Morale today.  Pt with severe asthma history, is on numerous preventative meds, sees an asthma/allergy specialist, and is on Sipsey every other week.  She is actually due for her Dupixent shot today.  Pt has been having progressively worsening SOB for past few days.  Wheezing.  No fever, no productive cough.  Seen in ED on 6/19.  Started on prednisone.  Symptoms persisted and worsened.  In to ED with respiratory distress today.   ED Course: EMS gave solumedrol, mag, 10mg  albuterol, 0.5 atrovent.  In ED they gave another 10mg  albuterol, mag, 0.5 atrovent.  Despite this, pt still SOB, blowing just 50 on the FVC meter.  Now getting another 5mg  albuterol.  CXR neg.   Subjective: A/O x4, positive S OB, negative CP, negative productive cough.  States cannot think of any inciting factor just became short of breath yesterday.  States does not have these episodes..  Does not smoke.   Assessment & Plan: Covid vaccination; positive vaccination   Principal Problem:   Status asthmaticus Active Problems:   HTN (hypertension)   COPD/asthma   Bipolar I disorder (HCC)   Cervical radiculopathy  Status asthmaticus/COPD exacerbation -Titrate BiPAP to maintain PO2> 60 -Solu-Medrol 60 mg BID -DuoNeb QID -Flutter valve -Incentive spirometry -Respiratory virus panel pending -Ativan PRN   Essential HTN -irbesartan 150mg  + HCTZ 25 mg daily -Metoprolol IV 5 mg QID  Bipolar DO/Depression/ Fibromyalgia, -Does not appear home any home medication.  Seizures -Carbamazepine 400 mg BID  Cervical radiculopathy -Per EMR Dr. Arnoldo Morale was informed patient was admitted  and therefore her surgery would be canceled.    DVT prophylaxis: Lovenox Code Status: Full Family Communication: 6/24 family friend at bedside for discussion of plan of care Status is: Inpatient    Dispo: The patient is from: Home              Anticipated d/c is to: Home              Anticipated d/c date is: 6/30              Patient currently unstable COPD exacerbation      Consultants:    Procedures/Significant Events:    I have personally reviewed and interpreted all radiology studies and my findings are as above.  VENTILATOR SETTINGS:    Cultures 6/24 SARS coronavirus negative   Antimicrobials: Anti-infectives (From admission, onward)   Start     Ordered Stop   06/09/20 1330  azithromycin (ZITHROMAX) 500 mg in sodium chloride 0.9 % 250 mL IVPB     Discontinue     06/09/20 1229     06/09/20 0430  cefTRIAXone (ROCEPHIN) 1 g in sodium chloride 0.9 % 100 mL IVPB     Discontinue     06/09/20 0427 06/14/20 0559        Devices    LINES / TUBES:      Continuous Infusions: . sodium chloride 75 mL/hr at 06/09/20 0452  . albuterol 5 mg/hr (06/09/20 0444)  . cefTRIAXone (ROCEPHIN)  IV Stopped (06/09/20 0727)     Objective: Vitals:   06/09/20 0400 06/09/20 0500 06/09/20 0631 06/09/20 0815  BP: Marland Kitchen)  135/95 (!) 164/97 (!) 164/97 129/86  Pulse: 74 84 (!) 102 84  Resp: (!) 21 20  17   Temp:      TempSrc:      SpO2: 95% 100% 100% 100%  Weight:      Height:        Intake/Output Summary (Last 24 hours) at 06/09/2020 1121 Last data filed at 06/09/2020 0847 Gross per 24 hour  Intake --  Output 800 ml  Net -800 ml   Filed Weights   06/09/20 0049  Weight: 68.9 kg    Examination:  General: A/O x4, positive acute respiratory distress Eyes: negative scleral hemorrhage, negative anisocoria, negative icterus ENT: Negative Runny nose, negative gingival bleeding, Neck:  Negative scars, masses, torticollis, lymphadenopathy, JVD Lungs: Diffuse decreased  breath sounds, diffuse expiratory wheeze, poor air movement throughout.  Negative crackles  Cardiovascular: Tachycardic without murmur gallop or rub normal S1 and S2 Abdomen: negative abdominal pain, nondistended, positive soft, bowel sounds, no rebound, no ascites, no appreciable mass Extremities: No significant cyanosis, clubbing, or edema bilateral lower extremities Skin: Negative rashes, lesions, ulcers Psychiatric:  Negative depression, negative anxiety, negative fatigue, negative mania  Central nervous system:  Cranial nerves II through XII intact, tongue/uvula midline, all extremities muscle strength 5/5, sensation intact throughout,  negative dysarthria, negative expressive aphasia, negative receptive aphasia.  .     Data Reviewed: Care during the described time interval was provided by me .  I have reviewed this patient's available data, including medical history, events of note, physical examination, and all test results as part of my evaluation.  CBC: Recent Labs  Lab 06/03/20 2342 06/07/20 1002 06/09/20 0045 06/09/20 0120  WBC 13.4* 17.8* 16.5*  --   NEUTROABS  --   --  9.2*  --   HGB 12.6 11.2* 10.0* 10.9*  HCT 39.3 36.1 32.0* 32.0*  MCV 89.5 88.7 91.4  --   PLT 431* 394 340  --    Basic Metabolic Panel: Recent Labs  Lab 06/03/20 2342 06/07/20 1002 06/09/20 0045 06/09/20 0120  NA 139 140 143 142  K 4.0 4.5 3.7 3.5  CL 103 104 109  --   CO2 24 27 26   --   GLUCOSE 137* 123* 118*  --   BUN 10 17 11   --   CREATININE 0.73 0.67 0.65  --   CALCIUM 9.0 9.2 8.0*  --    GFR: Estimated Creatinine Clearance: 72.3 mL/min (by C-G formula based on SCr of 0.65 mg/dL). Liver Function Tests: Recent Labs  Lab 06/07/20 1002 06/09/20 0045  AST 28 21  ALT 48* 33  ALKPHOS 74 59  BILITOT 0.5 0.5  PROT 6.3* 5.1*  ALBUMIN 3.8 3.1*   No results for input(s): LIPASE, AMYLASE in the last 168 hours. No results for input(s): AMMONIA in the last 168 hours. Coagulation  Profile: No results for input(s): INR, PROTIME in the last 168 hours. Cardiac Enzymes: No results for input(s): CKTOTAL, CKMB, CKMBINDEX, TROPONINI in the last 168 hours. BNP (last 3 results) No results for input(s): PROBNP in the last 8760 hours. HbA1C: No results for input(s): HGBA1C in the last 72 hours. CBG: No results for input(s): GLUCAP in the last 168 hours. Lipid Profile: No results for input(s): CHOL, HDL, LDLCALC, TRIG, CHOLHDL, LDLDIRECT in the last 72 hours. Thyroid Function Tests: No results for input(s): TSH, T4TOTAL, FREET4, T3FREE, THYROIDAB in the last 72 hours. Anemia Panel: No results for input(s): VITAMINB12, FOLATE, FERRITIN, TIBC, IRON, RETICCTPCT in the last  72 hours. Sepsis Labs: No results for input(s): PROCALCITON, LATICACIDVEN in the last 168 hours.  Recent Results (from the past 240 hour(s))  Surgical pcr screen     Status: None   Collection Time: 06/07/20 10:02 AM   Specimen: Nasal Mucosa; Nasal Swab  Result Value Ref Range Status   MRSA, PCR NEGATIVE NEGATIVE Final   Staphylococcus aureus NEGATIVE NEGATIVE Final    Comment: (NOTE) The Xpert SA Assay (FDA approved for NASAL specimens in patients 79 years of age and older), is one component of a comprehensive surveillance program. It is not intended to diagnose infection nor to guide or monitor treatment. Performed at [facility], [place] [address], [place], [place] [address]   SARS CORONAVIRUS 2 (TAT 6-24 HRS) Nasopharyngeal Nasopharyngeal Swab     Status: None   Collection Time: 06/07/20 10:41 AM   Specimen: Nasopharyngeal Swab  Result Value Ref Range Status   SARS Coronavirus 2 NEGATIVE NEGATIVE Final    Comment: (NOTE) SARS-CoV-2 target nucleic acids are NOT DETECTED.  The SARS-CoV-2 RNA is generally detectable in upper and lower respiratory specimens during the acute phase of infection. Negative results do not preclude SARS-CoV-2 infection, do not rule out co-infections with other  pathogens, and should not be used as the sole basis for treatment or other patient management decisions. Negative results must be combined with clinical observations, patient history, and epidemiological information. The expected result is Negative.  Fact Sheet for Patients: SugarRoll.be  Fact Sheet for Healthcare Providers: https://www.-mathews.com/  This test is not yet approved or cleared by the [place] FDA and  has been authorized for detection and/or diagnosis of SARS-CoV-2 by FDA under an Emergency Use Authorization (EUA). This EUA will remain  in effect (meaning this test can be used) for the duration of the COVID-19 declaration under Se ction 564(b)(1) of the Act, 21 U.S.C. section 360bbb-3(b)(1), unless the authorization is terminated or revoked sooner.  Performed at [facility], [place] [address], [place], [place] [address]   SARS Coronavirus 2 by RT PCR (hospital order, performed in [facility] hospital lab) Nasopharyngeal Nasopharyngeal Swab     Status: None   Collection Time: 06/09/20  4:59 AM   Specimen: Nasopharyngeal Swab  Result Value Ref Range Status   SARS Coronavirus 2 NEGATIVE NEGATIVE Final    Comment: (NOTE) SARS-CoV-2 target nucleic acids are NOT DETECTED.  The SARS-CoV-2 RNA is generally detectable in upper and lower respiratory specimens during the acute phase of infection. The lowest concentration of SARS-CoV-2 viral copies this assay can detect is 250 copies / mL. A negative result does not preclude SARS-CoV-2 infection and should not be used as the sole basis for treatment or other patient management decisions.  A negative result may occur with improper specimen collection / handling, submission of specimen other than nasopharyngeal swab, presence of viral mutation(s) within the areas targeted by this assay, and inadequate number of viral copies (<250 copies / mL). A negative result must be  combined with clinical observations, patient history, and epidemiological information.  Fact Sheet for Patients:   StrictlyIdeas.no  Fact Sheet for Healthcare Providers: BankingDealers.co.za  This test is not yet approved or  cleared by the [place] FDA and has been authorized for detection and/or diagnosis of SARS-CoV-2 by FDA under an Emergency Use Authorization (EUA).  This EUA will remain in effect (meaning this test can be used) for the duration of the COVID-19 declaration under Section 564(b)(1) of the Act, 21 U.S.C.  section 360bbb-3(b)(1), unless the authorization is terminated or revoked sooner.  Performed at [facility], [place] [address], [place], [place] [address]          Radiology Studies: DG Chest Port 1 View  Result Date: 06/09/2020 CLINICAL DATA:  Shortness of breath EXAM: PORTABLE CHEST 1 VIEW COMPARISON:  06/03/2020 FINDINGS: The heart size and mediastinal contours are within normal limits. Both lungs are clear. The visualized skeletal structures are unremarkable. IMPRESSION: No active disease. Electronically Signed   By: Ulyses Jarred M.D.   On: 06/09/2020 01:00        Scheduled Meds: . carbamazepine  400 mg Oral BID  . cholecalciferol  5,000 Units Oral Daily  . diclofenac Sodium  4 g Topical QID  . dupilumab  300 mg Subcutaneous Q14 Days  . dupilumab  300 mg Subcutaneous Once  . enoxaparin (LOVENOX) injection  40 mg Subcutaneous Q24H  . famotidine  20 mg Oral BID  . irbesartan  150 mg Oral Daily   And  . hydrochlorothiazide  25 mg Oral Daily  . hydrOXYzine  25 mg Oral BID WC  . hydrOXYzine  50 mg Oral QHS  . loratadine  10 mg Oral Daily  . methylPREDNISolone (SOLU-MEDROL) injection  60 mg Intravenous Q12H  . mometasone-formoterol  1 puff Inhalation BID  . montelukast  10 mg Oral QHS  . pantoprazole  40 mg Oral Daily  . pravastatin  40 mg Oral q1800  . umeclidinium bromide  1 puff  Inhalation Daily   Continuous Infusions: . sodium chloride 75 mL/hr at 06/09/20 0452  . albuterol 5 mg/hr (06/09/20 0444)  . cefTRIAXone (ROCEPHIN)  IV Stopped (06/09/20 0727)     LOS: 0 days    Time spent:40 min    Nekeya Briski, Geraldo Docker, MD [facility] Pager [phone]  If 7PM-7AM, please contact night-coverage [email or web address] Password [facility] 06/09/2020, 11:21 AM

## 2020-06-09 NOTE — ED Notes (Signed)
Called RT to ask about pt coming off bi pap as a trial. Pt requested.

## 2020-06-09 NOTE — ED Notes (Signed)
Pt attempted to ambulate to the restroom with assistance from significant other, pt experienced worsening SOB. RN notified.

## 2020-06-09 NOTE — ED Notes (Signed)
ABG completed by Caryl Pina RT, this clicked off task

## 2020-06-09 NOTE — Progress Notes (Signed)
RT in for scheduled neb tx. Pt in no obvious respiratory distress, SpO2 100% on  3.5L [place]. Diminished bbs/fine crackles noted with upper airway wheeze. O2 weaned to 2L per SpO2 100%. RT will continue to monitor.

## 2020-06-09 NOTE — ED Notes (Signed)
PT very visibly anxious. Pt has bipap on and does not like it. Requested IV anxiety med from provider.

## 2020-06-09 NOTE — Progress Notes (Signed)
Pt was seen side of bed with O2 sats dropping with effort but maintained with O2 cannula in 90's.  Her fatigue from her SOB was evident, and was likely the source of her unsteady gait and shakiness with legs.  Have planned to see tomorrow to mobilize again, and see if she can demonstrate a more reliable gait for return home and to reschedule her cervical spine surgery.    06/09/20 1600  PT Visit Information  Last PT Received On 06/09/20  Assistance Needed +1  History of Present Illness 59 yo female with pending ACDF was brought to ED for worsening SOB with asthma history, has been on bipap with some anxiety today.  Recent trip to ED for same.  PMHx:  COPD, HTN, asthma, fibromyalgia, PVD, cervical radiculopathy, migraines  Precautions  Precautions Fall  Precaution Comments monitor vitals  Restrictions  Weight Bearing Restrictions No  Home Living  Family/patient expects to be discharged to: Private residence  Living Arrangements Alone  Available Help at Discharge Family;Available PRN/intermittently  Type of Home House  Home Access Level entry  Home Layout One level  Bathroom Shower/Tub Tub/shower unit  Tax adviser - 2 wheels  Prior Function  Level of Independence Independent  Communication  Communication No difficulties  Pain Assessment  Pain Assessment Faces  Faces Pain Scale 6  Pain Location neck, has ACDF planned  Pain Descriptors / Indicators Grimacing;Guarding  Pain Intervention(s) Limited activity within patient's tolerance;Monitored during session;Repositioned  Cognition  Arousal/Alertness Lethargic;Awake/alert  Behavior During Therapy WFL for tasks assessed/performed  Overall Cognitive Status Within Functional Limits for tasks assessed  General Comments tired with pain in neck  Upper Extremity Assessment  Upper Extremity Assessment Overall WFL for tasks assessed  Lower Extremity Assessment  Lower Extremity Assessment Overall WFL for  tasks assessed  Cervical / Trunk Assessment  Cervical / Trunk Assessment Other exceptions (has cervical radiculopathy)  Cervical / Trunk Exceptions pending spine surgery  Bed Mobility  Overal bed mobility Needs Assistance  Bed Mobility Supine to Sit;Sit to Supine  Supine to sit Min assist  Sit to supine Min assist  General bed mobility comments min assist due to struggle to move but can help with extremities  Transfers  Overall transfer level Needs assistance  Equipment used 1 person hand held assist  Transfers Sit to/from Stand  Sit to Stand Min assist  General transfer comment min assist to stand with control of balance with BUE's supporting pt, legs are shaky in standing  Ambulation/Gait  General Gait Details did not take a walk, just sidestepped to set up to get back to bed  Gait velocity reduced  Gait velocity interpretation <1.31 ft/sec, indicative of household ambulator  Balance  Overall balance assessment Needs assistance  Sitting balance-Leahy Scale Fair  Postural control Posterior lean  Standing balance support Bilateral upper extremity supported;During functional activity  Standing balance-Leahy Scale Poor  Standing balance comment buckling appearance of legs, not expected by mm testing legs  General Comments  General comments (skin integrity, edema, etc.) Pt stood with PT on side of bed and had shaky legs with the effort, although she can take unsteady steps to get up higher in bed.  Has been on bipap today  Exercises  Exercises Other exercises (LE strength is WFL when tested on bed)  PT - End of Session  Equipment Utilized During Treatment Oxygen;Gait belt  Activity Tolerance Patient limited by fatigue;Treatment limited secondary to medical complications (Comment)  Patient left in bed;with  call bell/phone within reach  Nurse Communication Mobility status;Other (comment) (plan to see how tomorrow looks)  PT Assessment  PT Recommendation/Assessment Patient needs  continued PT services  PT Visit Diagnosis Unsteadiness on feet (R26.81);Other abnormalities of gait and mobility (R26.89);Pain  Pain - Right/Left  (neck)  Pain - part of body  (neck)  PT Problem List Decreased range of motion;Decreased activity tolerance;Decreased balance;Decreased mobility;Decreased coordination;Cardiopulmonary status limiting activity;Pain  Barriers to Discharge Decreased caregiver support  Barriers to Discharge Comments home alone and unsteady  PT Plan  PT Frequency (ACUTE ONLY) Min 4X/week  PT Treatment/Interventions (ACUTE ONLY) DME instruction;Gait training;Functional mobility training;Therapeutic activities;Therapeutic exercise;Balance training;Neuromuscular re-education;Patient/family education  AM-PAC PT "6 Clicks" Mobility Outcome Measure (Version 2)  Help needed turning from your back to your side while in a flat bed without using bedrails? 4  Help needed moving from lying on your back to sitting on the side of a flat bed without using bedrails? 3  Help needed moving to and from a bed to a chair (including a wheelchair)? 3  Help needed standing up from a chair using your arms (e.g., wheelchair or bedside chair)? 3  Help needed to walk in hospital room? 3  Help needed climbing 3-5 steps with a railing?  2  6 Click Score 18  Consider Recommendation of Discharge To: Home with [facility]  PT Recommendation  Follow Up Recommendations Home health PT;Supervision for mobility/OOB  PT equipment Rolling walker with 5" wheels  Individuals Consulted  Consulted and Agree with Results and Recommendations Patient  Acute Rehab PT Goals  Patient Stated Goal to walk and get home  PT Goal Formulation With patient  Time For Goal Achievement 06/23/20  Potential to Achieve Goals Good  PT Time Calculation  PT Start Time (ACUTE ONLY) 1439  PT Stop Time (ACUTE ONLY) 1459  PT Time Calculation (min) (ACUTE ONLY) 20 min  PT General Charges  $$ ACUTE PT VISIT 1 Visit  PT Evaluation  $PT  Eval Moderate Complexity 1 Mod  Written Expression  Dominant Hand Right    Mee Hives, PT MS Acute Rehab Dept. Number: [place] [facility] [place]

## 2020-06-09 NOTE — Progress Notes (Signed)
I spoke with the patient.  She feels better.  I have instructed her to call my office to reschedule her surgery when she is feeling better.

## 2020-06-09 NOTE — ED Triage Notes (Signed)
Pt called EMS for SOB/Asthma; EMS gave 10mg  albuterol,  0.5 atrovent, 125mg  solu-medrol, 1 gram Magnesium. 18ga in [place] . A&O x4

## 2020-06-09 NOTE — Progress Notes (Signed)
Patient's peak flow is 100 l/m. This is patient's best effort out of several attempts.

## 2020-06-09 NOTE — Progress Notes (Signed)
[name] is a 59 y.o. female patient admitted from ED awake, alert - oriented  X 4 - no acute distress noted.  VSS - Blood pressure 125/81, pulse 77, temperature 99 F (37.2 C), temperature source Oral, resp. rate 18, height 5\' 3"  (1.6 m), weight 68.9 kg, SpO2 98 %.    IV in place, occlusive dsg intact without redness.  Orientation to room, and floor completed with information packet given to patient/family.  Patient declined safety video at this time.  Admission INP armband ID verified with patient/family, and in place.   SR up x 2, fall assessment complete, with patient and family able to verbalize understanding of risk associated with falls, and verbalized understanding to call nsg before up out of bed.  Call light within reach, patient able to voice, and demonstrate understanding.  Skin, clean-dry- intact without evidence of bruising, or skin tears.   No evidence of skin break down noted on exam.     Will cont to eval and treat per MD orders.  Lenell Antu, RN 06/09/2020 6:29 PM

## 2020-06-09 NOTE — ED Notes (Signed)
Lunch Tray Ordered @ 1052.  

## 2020-06-10 ENCOUNTER — Other Ambulatory Visit: Payer: Self-pay

## 2020-06-10 ENCOUNTER — Inpatient Hospital Stay (HOSPITAL_COMMUNITY): Payer: Medicare HMO

## 2020-06-10 DIAGNOSIS — Z7951 Long term (current) use of inhaled steroids: Secondary | ICD-10-CM | POA: Diagnosis not present

## 2020-06-10 DIAGNOSIS — R42 Dizziness and giddiness: Secondary | ICD-10-CM | POA: Diagnosis present

## 2020-06-10 DIAGNOSIS — Z882 Allergy status to sulfonamides status: Secondary | ICD-10-CM | POA: Diagnosis not present

## 2020-06-10 DIAGNOSIS — F319 Bipolar disorder, unspecified: Secondary | ICD-10-CM | POA: Diagnosis not present

## 2020-06-10 DIAGNOSIS — Z791 Long term (current) use of non-steroidal anti-inflammatories (NSAID): Secondary | ICD-10-CM | POA: Diagnosis not present

## 2020-06-10 DIAGNOSIS — I10 Essential (primary) hypertension: Secondary | ICD-10-CM | POA: Diagnosis not present

## 2020-06-10 DIAGNOSIS — J441 Chronic obstructive pulmonary disease with (acute) exacerbation: Secondary | ICD-10-CM | POA: Diagnosis not present

## 2020-06-10 DIAGNOSIS — M1990 Unspecified osteoarthritis, unspecified site: Secondary | ICD-10-CM | POA: Diagnosis present

## 2020-06-10 DIAGNOSIS — M5412 Radiculopathy, cervical region: Secondary | ICD-10-CM | POA: Diagnosis not present

## 2020-06-10 DIAGNOSIS — Z7982 Long term (current) use of aspirin: Secondary | ICD-10-CM | POA: Diagnosis not present

## 2020-06-10 DIAGNOSIS — Z87891 Personal history of nicotine dependence: Secondary | ICD-10-CM | POA: Diagnosis not present

## 2020-06-10 DIAGNOSIS — R0602 Shortness of breath: Secondary | ICD-10-CM

## 2020-06-10 DIAGNOSIS — F419 Anxiety disorder, unspecified: Secondary | ICD-10-CM | POA: Diagnosis not present

## 2020-06-10 DIAGNOSIS — Z79899 Other long term (current) drug therapy: Secondary | ICD-10-CM | POA: Diagnosis not present

## 2020-06-10 DIAGNOSIS — T50905A Adverse effect of unspecified drugs, medicaments and biological substances, initial encounter: Secondary | ICD-10-CM | POA: Diagnosis not present

## 2020-06-10 DIAGNOSIS — I952 Hypotension due to drugs: Secondary | ICD-10-CM | POA: Diagnosis not present

## 2020-06-10 DIAGNOSIS — R7303 Prediabetes: Secondary | ICD-10-CM | POA: Diagnosis present

## 2020-06-10 DIAGNOSIS — Z20822 Contact with and (suspected) exposure to covid-19: Secondary | ICD-10-CM | POA: Diagnosis not present

## 2020-06-10 DIAGNOSIS — J4552 Severe persistent asthma with status asthmaticus: Secondary | ICD-10-CM | POA: Diagnosis not present

## 2020-06-10 DIAGNOSIS — J45902 Unspecified asthma with status asthmaticus: Secondary | ICD-10-CM | POA: Diagnosis present

## 2020-06-10 DIAGNOSIS — R569 Unspecified convulsions: Secondary | ICD-10-CM | POA: Diagnosis not present

## 2020-06-10 DIAGNOSIS — J4522 Mild intermittent asthma with status asthmaticus: Secondary | ICD-10-CM | POA: Diagnosis not present

## 2020-06-10 DIAGNOSIS — I739 Peripheral vascular disease, unspecified: Secondary | ICD-10-CM | POA: Diagnosis present

## 2020-06-10 DIAGNOSIS — Z885 Allergy status to narcotic agent status: Secondary | ICD-10-CM | POA: Diagnosis not present

## 2020-06-10 DIAGNOSIS — K219 Gastro-esophageal reflux disease without esophagitis: Secondary | ICD-10-CM | POA: Diagnosis present

## 2020-06-10 DIAGNOSIS — M797 Fibromyalgia: Secondary | ICD-10-CM | POA: Diagnosis present

## 2020-06-10 DIAGNOSIS — Z8719 Personal history of other diseases of the digestive system: Secondary | ICD-10-CM | POA: Diagnosis not present

## 2020-06-10 LAB — COMPREHENSIVE METABOLIC PANEL
ALT: 35 U/L (ref 0–44)
AST: 17 U/L (ref 15–41)
Albumin: 3.3 g/dL — ABNORMAL LOW (ref 3.5–5.0)
Alkaline Phosphatase: 68 U/L (ref 38–126)
Anion gap: 9 (ref 5–15)
BUN: 17 mg/dL (ref 6–20)
CO2: 26 mmol/L (ref 22–32)
Calcium: 8.8 mg/dL — ABNORMAL LOW (ref 8.9–10.3)
Chloride: 102 mmol/L (ref 98–111)
Creatinine, Ser: 0.71 mg/dL (ref 0.44–1.00)
GFR calc Af Amer: >60 mL/min (ref >60)
GFR calc non Af Amer: >60 mL/min (ref >60)
Glucose, Bld: 110 mg/dL — ABNORMAL HIGH (ref 70–99)
Potassium: 4.4 mmol/L (ref 3.5–5.1)
Sodium: 137 mmol/L (ref 135–145)
Total Bilirubin: 0.4 mg/dL (ref 0.3–1.2)
Total Protein: 5.8 g/dL — ABNORMAL LOW (ref 6.5–8.1)

## 2020-06-10 LAB — ECHOCARDIOGRAM COMPLETE
Height: 63 in
Weight: 2659.63 oz

## 2020-06-10 LAB — CBC WITH DIFFERENTIAL/PLATELET
Abs Immature Granulocytes: 0 10*3/uL (ref 0.00–0.07)
Basophils Absolute: 0 10*3/uL (ref 0.0–0.1)
Basophils Relative: 0 %
Eosinophils Absolute: 0 10*3/uL (ref 0.0–0.5)
Eosinophils Relative: 0 %
HCT: 33.1 % — ABNORMAL LOW (ref 36.0–46.0)
Hemoglobin: 10.6 g/dL — ABNORMAL LOW (ref 12.0–15.0)
Lymphocytes Relative: 15 %
Lymphs Abs: 4.2 10*3/uL — ABNORMAL HIGH (ref 0.7–4.0)
MCH: 28.3 pg (ref 26.0–34.0)
MCHC: 32 g/dL (ref 30.0–36.0)
MCV: 88.3 fL (ref 80.0–100.0)
Monocytes Absolute: 1.1 10*3/uL — ABNORMAL HIGH (ref 0.1–1.0)
Monocytes Relative: 4 %
Neutro Abs: 22.7 10*3/uL — ABNORMAL HIGH (ref 1.7–7.7)
Neutrophils Relative %: 81 %
Platelets: 380 10*3/uL (ref 150–400)
RBC: 3.75 MIL/uL — ABNORMAL LOW (ref 3.87–5.11)
RDW: 15 % (ref 11.5–15.5)
WBC: 28 10*3/uL — ABNORMAL HIGH (ref 4.0–10.5)
nRBC: 0 % (ref 0.0–0.2)
nRBC: 0 /100 WBC

## 2020-06-10 LAB — TROPONIN I (HIGH SENSITIVITY)
Troponin I (High Sensitivity): <2 ng/L (ref <18)
Troponin I (High Sensitivity): 3 ng/L (ref <18)

## 2020-06-10 LAB — PHOSPHORUS: Phosphorus: 3.7 mg/dL (ref 2.5–4.6)

## 2020-06-10 LAB — MAGNESIUM: Magnesium: 2 mg/dL (ref 1.7–2.4)

## 2020-06-10 MED ORDER — NITROGLYCERIN 0.4 MG SL SUBL
0.4000 mg | SUBLINGUAL_TABLET | SUBLINGUAL | Status: DC | PRN
  Administered 2020-06-10: 0.4 mg via SUBLINGUAL
  Filled 2020-06-10: qty 1

## 2020-06-10 NOTE — Progress Notes (Signed)
Called re lower blood pressures  Patient is on the following regimen  -irbesartan 150mg  + HCTZ 25 mg daily -Metoprolol IV 5 mg  Will place med  Holding parameters  And d/c metoprolol iv standing   Primary attn to follow

## 2020-06-10 NOTE — Progress Notes (Signed)
  Echocardiogram 2D Echocardiogram has been performed.  [name] 06/10/2020, 5:56 PM

## 2020-06-10 NOTE — Plan of Care (Addendum)
Messaged MD about BP readings at 0400 and now and if he wants Lopressor given.  Call back received to hold am Lopressor and clarify order with MD on morning rounds.Marland Kitchen

## 2020-06-10 NOTE — Progress Notes (Addendum)
Occupational Therapy Evaluation Patient Details Name: [name] MRN: [id number] DOB: 1961-09-21 Today's Date: 06/10/2020    History of Present Illness 59 yo female with pending ACDF was brought to ED for worsening SOB with asthma history, has been on bipap with some anxiety today.  Recent trip to ED for same.  PMHx:  COPD, HTN, asthma, fibromyalgia, PVD, cervical radiculopathy, migraines   Clinical Impression   Patient lives alone in a house.  She is independent at baseline and usually uses a RW for mobility.  Patient has significant tremors with movement and states these are normal for her, though demonstrates unsteadiness with mobility.  Requires min guard for standing UB ADLs and min assist with LB ADLs.  Recommending SNF as patient does not have consistent support at home and tremors are causing an increased safety risk if she returns home alone.  Will continue to follow with OT acutely to address the deficits listed below.      Follow Up Recommendations  SNF    Equipment Recommendations  3 in 1 bedside commode;Other (comment) (walker)    Recommendations for Other Services       Precautions / Restrictions Precautions Precautions: Fall Precaution Comments: monitor vitals Restrictions Weight Bearing Restrictions: No      Mobility Bed Mobility Overal bed mobility: Needs Assistance             General bed mobility comments: OOB in chair  Transfers Overall transfer level: Needs assistance Equipment used: Rolling walker (2 wheeled) Transfers: Sit to/from Stand Sit to Stand: Min assist         General transfer comment: min assist to stand with control of balance with BUE's supporting pt, legs are shaky in standing    Balance Overall balance assessment: Needs assistance Sitting-balance support: No upper extremity supported;Feet supported Sitting balance-Leahy Scale: Fair     Standing balance support: Bilateral upper extremity supported;During functional  activity Standing balance-Leahy Scale: Poor Standing balance comment: Constant little tremors of legs that looks like buckling of legs                           ADL either performed or assessed with clinical judgement   ADL Overall ADL's : Needs assistance/impaired Eating/Feeding: Independent;Sitting   Grooming: Min guard;Standing   Upper Body Bathing: Min guard;Sitting   Lower Body Bathing: Minimal assistance;Sit to/from stand   Upper Body Dressing : Sitting;Min guard   Lower Body Dressing: Minimal assistance;Sit to/from stand   Toilet Transfer: Minimal assistance;Ambulation;RW   Toileting- Water quality scientist and Hygiene: Min guard;Sit to/from stand       Functional mobility during ADLs: Minimal assistance;Rolling walker       Vision         Perception     Praxis      Pertinent Vitals/Pain Pain Assessment: Faces Faces Pain Scale: Hurts little more Pain Location: neck, has ACDF planned Pain Descriptors / Indicators: Grimacing;Guarding Pain Intervention(s): Limited activity within patient's tolerance;Monitored during session;Repositioned     Hand Dominance Right   Extremity/Trunk Assessment Upper Extremity Assessment Upper Extremity Assessment: RUE deficits/detail;LUE deficits/detail;Generalized weakness RUE Deficits / Details: Tremors RUE Coordination: decreased fine motor LUE Deficits / Details: Tremors LUE Coordination: decreased fine motor   Lower Extremity Assessment Lower Extremity Assessment: Defer to PT evaluation       Communication Communication Communication: No difficulties   Cognition Arousal/Alertness: Awake/alert Behavior During Therapy: Flat affect;WFL for tasks assessed/performed Overall Cognitive Status: Within Functional Limits for  tasks assessed                                 General Comments: tired with pain in neck   General Comments  Patient on RA and SpO2 >96.     Exercises     Shoulder  Instructions      Home Living Family/patient expects to be discharged to:: Private residence Living Arrangements: Alone Available Help at Discharge: Family;Available PRN/intermittently Type of Home: House Home Access: Level entry     Home Layout: One level     Bathroom Shower/Tub: Teacher, early years/pre: Standard     Home Equipment: Walker - 2 wheels          Prior Functioning/Environment Level of Independence: Independent        Comments: States she uses a RW at home for mobility and has had tremors prior to admission.        OT Problem List: Decreased strength;Decreased activity tolerance;Impaired balance (sitting and/or standing);Decreased coordination;Cardiopulmonary status limiting activity;Impaired UE functional use      OT Treatment/Interventions: Self-care/ADL training;Therapeutic exercise;Energy conservation;Patient/family education;Balance training;Therapeutic activities;DME and/or AE instruction    OT Goals(Current goals can be found in the care plan section) Acute Rehab OT Goals Patient Stated Goal: to walk and get home OT Goal Formulation: With patient Time For Goal Achievement: 06/24/20 Potential to Achieve Goals: Good ADL Goals Pt Will Perform Grooming: with modified independence;standing Pt Will Perform Upper Body Dressing: Independently;sitting Pt Will Perform Lower Body Dressing: with modified independence;sit to/from stand Pt Will Transfer to Toilet: with modified independence;ambulating;regular height toilet Additional ADL Goal #1: Patient will independently verbalize 3 fall prevention strategies.  OT Frequency: Min 2X/week   Barriers to D/C:            Co-evaluation              AM-PAC OT "6 Clicks" Daily Activity     Outcome Measure Help from another person eating meals?: None Help from another person taking care of personal grooming?: A Little Help from another person toileting, which includes using toliet, bedpan,  or urinal?: A Little Help from another person bathing (including washing, rinsing, drying)?: A Little Help from another person to put on and taking off regular upper body clothing?: A Little Help from another person to put on and taking off regular lower body clothing?: A Little 6 Click Score: 19   End of Session Equipment Utilized During Treatment: Gait belt;Rolling walker Nurse Communication: Mobility status  Activity Tolerance: Patient tolerated treatment well Patient left: in chair;with call bell/phone within reach  OT Visit Diagnosis: Unsteadiness on feet (R26.81);Other abnormalities of gait and mobility (R26.89);Muscle weakness (generalized) (M62.81)                Time: 8032-1224 OT Time Calculation (min): 19 min Charges:  OT General Charges $OT Visit: 1 Visit OT Evaluation $OT Eval Moderate Complexity: 1 [address] OTR/L   Phylliss Bob 06/10/2020, 2:22 PM

## 2020-06-10 NOTE — Progress Notes (Signed)
Physical Therapy Treatment Patient Details Name: [name] MRN: [id number] DOB: November 12, 1961 Today's Date: 06/10/2020    History of Present Illness 59 yo female with pending ACDF was brought to ED for worsening SOB with asthma history, has been on bipap with some anxiety today.  Recent trip to ED for same.  PMHx:  COPD, HTN, asthma, fibromyalgia, PVD, cervical radiculopathy, migraines    PT Comments    Pt continues to be tremulous in all extremities with mobility. Feel if she goes home she needs assist with any OOB mobility. Asked pt if anyone could stay with her a few days. I thought she might have arranged for someone to stay with her after her planned (now postponed neck surgery) She mentioned her daughters but didn't seem to be definite about this. She may need ST-SNF if she doesn't have anyone to assist on initial dc.   Follow Up Recommendations  SNF     Equipment Recommendations       Recommendations for Other Services       Precautions / Restrictions Precautions Precautions: Fall Precaution Comments: monitor vitals Restrictions Weight Bearing Restrictions: No    Mobility  Bed Mobility Overal bed mobility: Needs Assistance Bed Mobility: Supine to Sit     Supine to sit: Min guard;HOB elevated     General bed mobility comments: Incr time and effort  Transfers Overall transfer level: Needs assistance Equipment used: Rolling walker (2 wheeled) Transfers: Sit to/from Stand Sit to Stand: Min assist         General transfer comment: Assist for balance and support due to tremors in bilateral legs  Ambulation/Gait Ambulation/Gait assistance: Min assist Gait Distance (Feet): 30 Feet Assistive device: Rolling walker (2 wheeled) Gait Pattern/deviations: Step-through pattern;Decreased step length - right;Decreased step length - left;Narrow base of support Gait velocity: decr Gait velocity interpretation: <1.31 ft/sec, indicative of household  ambulator General Gait Details: Assist for balance and support. Pt with tremors in all 4 extremities causing instability.   Stairs             Wheelchair Mobility    Modified Rankin (Stroke Patients Only)       Balance Overall balance assessment: Needs assistance Sitting-balance support: No upper extremity supported;Feet supported Sitting balance-Leahy Scale: Fair     Standing balance support: Bilateral upper extremity supported;During functional activity Standing balance-Leahy Scale: Poor Standing balance comment: walker and min assist for static standing                            Cognition Arousal/Alertness: Awake/alert Behavior During Therapy: Flat affect;WFL for tasks assessed/performed Overall Cognitive Status: Within Functional Limits for tasks assessed                                 General Comments: tired with pain in neck      Exercises      General Comments General comments (skin integrity, edema, etc.): VSS. Pt with SpO2 >92% on RA. Left O2 off.       Pertinent Vitals/Pain Pain Assessment: Faces Faces Pain Scale: Hurts little more Pain Location: neck, has ACDF planned Pain Descriptors / Indicators: Grimacing;Guarding Pain Intervention(s): Limited activity within patient's tolerance;Repositioned    Home Living Family/patient expects to be discharged to:: Private residence Living Arrangements: Alone Available Help at Discharge: Family;Available PRN/intermittently Type of Home: House Home Access: Level entry   Home Layout: One  level Home Equipment: Walker - 2 wheels      Prior Function Level of Independence: Independent      Comments: States she uses a RW at home for mobility and has had tremors prior to admission.   PT Goals (current goals can now be found in the care plan section) Acute Rehab PT Goals Patient Stated Goal: to walk and get home Progress towards PT goals: Progressing toward goals     Frequency    Min 3X/week      PT Plan Discharge plan needs to be updated;Frequency needs to be updated    Co-evaluation              AM-PAC PT "6 Clicks" Mobility   Outcome Measure  Help needed turning from your back to your side while in a flat bed without using bedrails?: None Help needed moving from lying on your back to sitting on the side of a flat bed without using bedrails?: A Little Help needed moving to and from a bed to a chair (including a wheelchair)?: A Little Help needed standing up from a chair using your arms (e.g., wheelchair or bedside chair)?: A Little Help needed to walk in hospital room?: A Little Help needed climbing 3-5 steps with a railing? : A Lot 6 Click Score: 18    End of Session Equipment Utilized During Treatment: Gait belt Activity Tolerance: Patient limited by fatigue Patient left: in chair;with call bell/phone within reach Nurse Communication: Mobility status PT Visit Diagnosis: Unsteadiness on feet (R26.81);Muscle weakness (generalized) (M62.81) Pain - part of body:  (neck)     Time: 0277-4128 PT Time Calculation (min) (ACUTE ONLY): 18 min  Charges:  $Gait Training: 8-22 mins                     [place] Pager [phone] Office [place] 06/10/2020, 3:59 PM

## 2020-06-10 NOTE — Progress Notes (Signed)
PROGRESS NOTE    [name]  LKJ:[id number] DOB: Aug 15, 1961 DOA: 06/09/2020 PCP: Biagio Borg, MD     Brief Narrative:  59 y.o. BF PMHx seizure, anemia, bipolar DO, depression, fibromyalgia, HTN, PVD, asthma / COPD overlap, HTN.  Prediabetes.  Pt has cervical radiculopathy and was scheduled for ACDF with Dr. Arnoldo Morale today.  Pt with severe asthma history, is on numerous preventative meds, sees an asthma/allergy specialist, and is on [place] every other week.  She is actually due for her Dupixent shot today.  Pt has been having progressively worsening SOB for past few days.  Wheezing.  No fever, no productive cough.  Seen in ED on 6/19.  Started on prednisone.  Symptoms persisted and worsened.  In to ED with respiratory distress today.   ED Course: EMS gave solumedrol, mag, 10mg  albuterol, 0.5 atrovent.  In ED they gave another 10mg  albuterol, mag, 0.5 atrovent.  Despite this, pt still SOB, blowing just 50 on the FVC meter.  Now getting another 5mg  albuterol.  CXR neg.   Subjective: 6/25 A/O x4, positive CP which is now resolved.  Negative S OB.  Negative N/V,     Assessment & Plan: Covid vaccination; positive vaccination   Principal Problem:   Status asthmaticus Active Problems:   HTN (hypertension)   COPD/asthma   Bipolar I disorder (HCC)   Cervical radiculopathy  Status asthmaticus/COPD exacerbation -Titrate BiPAP to maintain PO2> 60 -Solu-Medrol 60 mg BID -DuoNeb QID -Flutter valve -Incentive spirometry -Respiratory virus panel pending -Ativan PRN   Essential HTN -irbesartan 150mg  + HCTZ 25 mg daily -Metoprolol IV 5 mg QID  Chest pain -6/25 EKG; NSR with some sinus arrhythmia -Trend troponin -Echocardiogram pending   Bipolar DO/Depression/ Fibromyalgia, -Does not appear home any home medication.  Seizures -Carbamazepine 400 mg BID  Cervical radiculopathy -Per EMR Dr. Arnoldo Morale was informed patient was admitted and therefore her  surgery would be canceled.    DVT prophylaxis: Lovenox Code Status: Full Family Communication: 6/24 family friend at bedside for discussion of plan of care Status is: Inpatient    Dispo: The patient is from: Home              Anticipated d/c is to: Home              Anticipated d/c date is: 6/30              Patient currently unstable COPD exacerbation      Consultants:    Procedures/Significant Events:    I have personally reviewed and interpreted all radiology studies and my findings are as above.  VENTILATOR SETTINGS:    Cultures 6/24 SARS coronavirus negative   Antimicrobials: Anti-infectives (From admission, onward)   Start     Ordered Stop   06/09/20 1330  azithromycin (ZITHROMAX) 500 mg in sodium chloride 0.9 % 250 mL IVPB     Discontinue     06/09/20 1229     06/09/20 0430  cefTRIAXone (ROCEPHIN) 1 g in sodium chloride 0.9 % 100 mL IVPB     Discontinue     06/09/20 0427 06/14/20 0559        Devices    LINES / TUBES:      Continuous Infusions: . sodium chloride 75 mL/hr at 06/09/20 1320  . azithromycin Stopped (06/09/20 1738)  . cefTRIAXone (ROCEPHIN)  IV 1 g (06/10/20 0621)     Objective: Vitals:   06/09/20 1735 06/09/20 1920 06/09/20 2101 06/10/20 0544  BP: 125/81 129/79 115/76  106/71  Pulse: 77 78 61 61  Resp: 18 19 19 13   Temp: 99 F (37.2 C) 98.3 F (36.8 C) 98.8 F (37.1 C) 98.9 F (37.2 C)  TempSrc: Oral Oral Oral Oral  SpO2: 98% 100% 100% 98%  Weight:    75.4 kg  Height:        Intake/Output Summary (Last 24 hours) at 06/10/2020 0756 Last data filed at 06/10/2020 0553 Gross per 24 hour  Intake 1304.96 ml  Output 1475 ml  Net -170.04 ml   Filed Weights   06/09/20 0049 06/10/20 0544  Weight: 68.9 kg 75.4 kg    Examination:  General: A/O x4, positive acute respiratory distress Eyes: negative scleral hemorrhage, negative anisocoria, negative icterus ENT: Negative Runny nose, negative gingival bleeding, Neck:   Negative scars, masses, torticollis, lymphadenopathy, JVD Lungs: Diffuse decreased breath sounds, diffuse expiratory wheeze, poor air movement throughout.  Negative crackles  Cardiovascular: Tachycardic without murmur gallop or rub normal S1 and S2 Abdomen: negative abdominal pain, nondistended, positive soft, bowel sounds, no rebound, no ascites, no appreciable mass Extremities: No significant cyanosis, clubbing, or edema bilateral lower extremities Skin: Negative rashes, lesions, ulcers Psychiatric:  Negative depression, negative anxiety, negative fatigue, negative mania  Central nervous system:  Cranial nerves II through XII intact, tongue/uvula midline, all extremities muscle strength 5/5, sensation intact throughout,  negative dysarthria, negative expressive aphasia, negative receptive aphasia.  .     Data Reviewed: Care during the described time interval was provided by me .  I have reviewed this patient's available data, including medical history, events of note, physical examination, and all test results as part of my evaluation.  CBC: Recent Labs  Lab 06/03/20 2342 06/07/20 1002 06/09/20 0045 06/09/20 0120 06/09/20 1143  WBC 13.4* 17.8* 16.5*  --   --   NEUTROABS  --   --  9.2*  --   --   HGB 12.6 11.2* 10.0* 10.9* 11.6*  HCT 39.3 36.1 32.0* 32.0* 34.0*  MCV 89.5 88.7 91.4  --   --   PLT 431* 394 340  --   --    Basic Metabolic Panel: Recent Labs  Lab 06/03/20 2342 06/07/20 1002 06/09/20 0045 06/09/20 0120 06/09/20 1143  NA 139 140 143 142 139  K 4.0 4.5 3.7 3.5 4.4  CL 103 104 109  --   --   CO2 24 27 26   --   --   GLUCOSE 137* 123* 118*  --   --   BUN 10 17 11   --   --   CREATININE 0.73 0.67 0.65  --   --   CALCIUM 9.0 9.2 8.0*  --   --    GFR: Estimated Creatinine Clearance: 75.4 mL/min (by C-G formula based on SCr of 0.65 mg/dL). Liver Function Tests: Recent Labs  Lab 06/07/20 1002 06/09/20 0045  AST 28 21  ALT 48* 33  ALKPHOS 74 59  BILITOT 0.5  0.5  PROT 6.3* 5.1*  ALBUMIN 3.8 3.1*   No results for input(s): LIPASE, AMYLASE in the last 168 hours. No results for input(s): AMMONIA in the last 168 hours. Coagulation Profile: No results for input(s): INR, PROTIME in the last 168 hours. Cardiac Enzymes: No results for input(s): CKTOTAL, CKMB, CKMBINDEX, TROPONINI in the last 168 hours. BNP (last 3 results) No results for input(s): PROBNP in the last 8760 hours. HbA1C: No results for input(s): HGBA1C in the last 72 hours. CBG: No results for input(s): GLUCAP in the last 168 hours.  Lipid Profile: No results for input(s): CHOL, HDL, LDLCALC, TRIG, CHOLHDL, LDLDIRECT in the last 72 hours. Thyroid Function Tests: No results for input(s): TSH, T4TOTAL, FREET4, T3FREE, THYROIDAB in the last 72 hours. Anemia Panel: No results for input(s): VITAMINB12, FOLATE, FERRITIN, TIBC, IRON, RETICCTPCT in the last 72 hours. Sepsis Labs: No results for input(s): PROCALCITON, LATICACIDVEN in the last 168 hours.  Recent Results (from the past 240 hour(s))  Surgical pcr screen     Status: None   Collection Time: 06/07/20 10:02 AM   Specimen: Nasal Mucosa; Nasal Swab  Result Value Ref Range Status   MRSA, PCR NEGATIVE NEGATIVE Final   Staphylococcus aureus NEGATIVE NEGATIVE Final    Comment: (NOTE) The Xpert SA Assay (FDA approved for NASAL specimens in patients 72 years of age and older), is one component of a comprehensive surveillance program. It is not intended to diagnose infection nor to guide or monitor treatment. Performed at [facility], [place] [address], [place], [place] [address]   SARS CORONAVIRUS 2 (TAT 6-24 HRS) Nasopharyngeal Nasopharyngeal Swab     Status: None   Collection Time: 06/07/20 10:41 AM   Specimen: Nasopharyngeal Swab  Result Value Ref Range Status   SARS Coronavirus 2 NEGATIVE NEGATIVE Final    Comment: (NOTE) SARS-CoV-2 target nucleic acids are NOT DETECTED.  The SARS-CoV-2 RNA is generally detectable in  upper and lower respiratory specimens during the acute phase of infection. Negative results do not preclude SARS-CoV-2 infection, do not rule out co-infections with other pathogens, and should not be used as the sole basis for treatment or other patient management decisions. Negative results must be combined with clinical observations, patient history, and epidemiological information. The expected result is Negative.  Fact Sheet for Patients: SugarRoll.be  Fact Sheet for Healthcare Providers: https://www.-mathews.com/  This test is not yet approved or cleared by the [place] FDA and  has been authorized for detection and/or diagnosis of SARS-CoV-2 by FDA under an Emergency Use Authorization (EUA). This EUA will remain  in effect (meaning this test can be used) for the duration of the COVID-19 declaration under Se ction 564(b)(1) of the Act, 21 U.S.C. section 360bbb-3(b)(1), unless the authorization is terminated or revoked sooner.  Performed at [facility], [place] [address], [place], [place] [address]   SARS Coronavirus 2 by RT PCR (hospital order, performed in [facility] hospital lab) Nasopharyngeal Nasopharyngeal Swab     Status: None   Collection Time: 06/09/20  4:59 AM   Specimen: Nasopharyngeal Swab  Result Value Ref Range Status   SARS Coronavirus 2 NEGATIVE NEGATIVE Final    Comment: (NOTE) SARS-CoV-2 target nucleic acids are NOT DETECTED.  The SARS-CoV-2 RNA is generally detectable in upper and lower respiratory specimens during the acute phase of infection. The lowest concentration of SARS-CoV-2 viral copies this assay can detect is 250 copies / mL. A negative result does not preclude SARS-CoV-2 infection and should not be used as the sole basis for treatment or other patient management decisions.  A negative result may occur with improper specimen collection / handling, submission of specimen other than  nasopharyngeal swab, presence of viral mutation(s) within the areas targeted by this assay, and inadequate number of viral copies (<250 copies / mL). A negative result must be combined with clinical observations, patient history, and epidemiological information.  Fact Sheet for Patients:   StrictlyIdeas.no  Fact Sheet for Healthcare Providers: BankingDealers.co.za  This test is not yet approved or  cleared by the  [place] Architectural technologist and has been authorized for detection and/or diagnosis of SARS-CoV-2 by FDA under an Print production planner (EUA).  This EUA will remain in effect (meaning this test can be used) for the duration of the COVID-19 declaration under Section 564(b)(1) of the Act, 21 U.S.C. section 360bbb-3(b)(1), unless the authorization is terminated or revoked sooner.  Performed at [facility], [place] [address], [place], [place] [address]   Respiratory Panel by PCR     Status: None   Collection Time: 06/09/20  1:26 PM   Specimen: Nasopharyngeal Swab; Respiratory  Result Value Ref Range Status   Adenovirus NOT DETECTED NOT DETECTED Final   Coronavirus 229E NOT DETECTED NOT DETECTED Final    Comment: (NOTE) The Coronavirus on the Respiratory Panel, DOES NOT test for the novel  Coronavirus (2019 nCoV)    Coronavirus HKU1 NOT DETECTED NOT DETECTED Final   Coronavirus NL63 NOT DETECTED NOT DETECTED Final   Coronavirus OC43 NOT DETECTED NOT DETECTED Final   Metapneumovirus NOT DETECTED NOT DETECTED Final   Rhinovirus / Enterovirus NOT DETECTED NOT DETECTED Final   Influenza A NOT DETECTED NOT DETECTED Final   Influenza B NOT DETECTED NOT DETECTED Final   Parainfluenza Virus 1 NOT DETECTED NOT DETECTED Final   Parainfluenza Virus 2 NOT DETECTED NOT DETECTED Final   Parainfluenza Virus 3 NOT DETECTED NOT DETECTED Final   Parainfluenza Virus 4 NOT DETECTED NOT DETECTED Final   Respiratory Syncytial Virus NOT DETECTED  NOT DETECTED Final   Bordetella pertussis NOT DETECTED NOT DETECTED Final   Chlamydophila pneumoniae NOT DETECTED NOT DETECTED Final   Mycoplasma pneumoniae NOT DETECTED NOT DETECTED Final    Comment: Performed at [facility], [place]. [address] [place], [place] [address]         Radiology Studies: DG Chest Port 1 View  Result Date: 06/09/2020 CLINICAL DATA:  Shortness of breath EXAM: PORTABLE CHEST 1 VIEW COMPARISON:  06/03/2020 FINDINGS: The heart size and mediastinal contours are within normal limits. Both lungs are clear. The visualized skeletal structures are unremarkable. IMPRESSION: No active disease. Electronically Signed   By: Ulyses Jarred M.D.   On: 06/09/2020 01:00        Scheduled Meds: . carbamazepine  400 mg Oral BID  . cholecalciferol  5,000 Units Oral Daily  . diclofenac Sodium  4 g Topical QID  . enoxaparin (LOVENOX) injection  40 mg Subcutaneous Q24H  . famotidine  20 mg Oral BID  . feeding supplement (ENSURE ENLIVE)  237 mL Oral BID BM  . irbesartan  150 mg Oral Daily   And  . hydrochlorothiazide  25 mg Oral Daily  . hydrOXYzine  25 mg Oral BID WC  . hydrOXYzine  50 mg Oral QHS  . ipratropium-albuterol  3 mL Nebulization TID  . loratadine  10 mg Oral Daily  . methylPREDNISolone (SOLU-MEDROL) injection  60 mg Intravenous Q12H  . mometasone-formoterol  1 puff Inhalation BID  . montelukast  10 mg Oral QHS  . pantoprazole  40 mg Oral Daily  . pravastatin  40 mg Oral q1800  . umeclidinium bromide  1 puff Inhalation Daily   Continuous Infusions: . sodium chloride 75 mL/hr at 06/09/20 1320  . azithromycin Stopped (06/09/20 1738)  . cefTRIAXone (ROCEPHIN)  IV 1 g (06/10/20 0621)     LOS: 0 days    Time spent:40 min    [place]pton Wixom, Geraldo Docker, MD [facility] Pager [phone]  If 7PM-7AM, please contact night-coverage [email or web address] Password  [email or web address] 06/10/2020, 7:56 AM

## 2020-06-10 NOTE — Progress Notes (Signed)
Nutrition Brief Note  Patient identified on the Malnutrition Screening Tool (MST) Report  Wt Readings from Last 15 Encounters:  06/10/20 75.4 kg  06/07/20 69.5 kg  05/12/20 70.9 kg  12/28/19 70.6 kg  11/04/19 73.5 kg  08/25/19 77.6 kg  08/12/19 78.5 kg  07/03/19 79.4 kg  06/09/19 81.3 kg  04/28/19 83 kg  02/17/19 85.3 kg  01/20/19 83.6 kg  01/14/19 83 kg  01/07/19 81.2 kg  01/06/19 82.6 kg   [name] is a 59 y.o. female with medical history significant of asthma / COPD overlap, HTN.  Pt admitted with asthma exacerbation.   Pt sleeping soundly at time of visit. She did not arouse to voice.   Pt consumed 100% of breakfast meal.   Reviewed wt hx; wt has been stable over the past year.   Nutrition-Focused physical exam completed. Findings are no fat depletion, no muscle depletion, and no edema.   Labs reviewed.   Current diet order is heart healthy, patient is consuming approximately 100% of meals at this time. Labs and medications reviewed.   No nutrition interventions warranted at this time. If nutrition issues arise, please consult RD.   Loistine Chance, RD, LDN, [place] Registered Dietitian II Certified Diabetes Care and Education Specialist Please refer to [facility] for RD and/or RD on-call/weekend/after hours pager

## 2020-06-11 DIAGNOSIS — I952 Hypotension due to drugs: Secondary | ICD-10-CM

## 2020-06-11 LAB — COMPREHENSIVE METABOLIC PANEL
ALT: 30 U/L (ref 0–44)
AST: 13 U/L — ABNORMAL LOW (ref 15–41)
Albumin: 3.1 g/dL — ABNORMAL LOW (ref 3.5–5.0)
Alkaline Phosphatase: 60 U/L (ref 38–126)
Anion gap: 10 (ref 5–15)
BUN: 20 mg/dL (ref 6–20)
CO2: 28 mmol/L (ref 22–32)
Calcium: 8.7 mg/dL — ABNORMAL LOW (ref 8.9–10.3)
Chloride: 100 mmol/L (ref 98–111)
Creatinine, Ser: 0.64 mg/dL (ref 0.44–1.00)
GFR calc Af Amer: >60 mL/min (ref >60)
GFR calc non Af Amer: >60 mL/min (ref >60)
Glucose, Bld: 85 mg/dL (ref 70–99)
Potassium: 4 mmol/L (ref 3.5–5.1)
Sodium: 138 mmol/L (ref 135–145)
Total Bilirubin: 0.4 mg/dL (ref 0.3–1.2)
Total Protein: 5.4 g/dL — ABNORMAL LOW (ref 6.5–8.1)

## 2020-06-11 LAB — CBC WITH DIFFERENTIAL/PLATELET
Abs Immature Granulocytes: 0.1 10*3/uL — ABNORMAL HIGH (ref 0.00–0.07)
Basophils Absolute: 0 10*3/uL (ref 0.0–0.1)
Basophils Relative: 0 %
Eosinophils Absolute: 0 10*3/uL (ref 0.0–0.5)
Eosinophils Relative: 0 %
HCT: 32.5 % — ABNORMAL LOW (ref 36.0–46.0)
Hemoglobin: 10.4 g/dL — ABNORMAL LOW (ref 12.0–15.0)
Immature Granulocytes: 1 %
Lymphocytes Relative: 19 %
Lymphs Abs: 4 10*3/uL (ref 0.7–4.0)
MCH: 27.7 pg (ref 26.0–34.0)
MCHC: 32 g/dL (ref 30.0–36.0)
MCV: 86.7 fL (ref 80.0–100.0)
Monocytes Absolute: 1.4 10*3/uL — ABNORMAL HIGH (ref 0.1–1.0)
Monocytes Relative: 6 %
Neutro Abs: 15.8 10*3/uL — ABNORMAL HIGH (ref 1.7–7.7)
Neutrophils Relative %: 74 %
Platelets: 329 10*3/uL (ref 150–400)
RBC: 3.75 MIL/uL — ABNORMAL LOW (ref 3.87–5.11)
RDW: 15.3 % (ref 11.5–15.5)
WBC: 21.2 10*3/uL — ABNORMAL HIGH (ref 4.0–10.5)
nRBC: 0 % (ref 0.0–0.2)

## 2020-06-11 LAB — MAGNESIUM: Magnesium: 2 mg/dL (ref 1.7–2.4)

## 2020-06-11 LAB — PHOSPHORUS: Phosphorus: 3.4 mg/dL (ref 2.5–4.6)

## 2020-06-11 MED ORDER — CEFDINIR 300 MG PO CAPS
600.0000 mg | ORAL_CAPSULE | Freq: Every day | ORAL | Status: DC
  Administered 2020-06-11 – 2020-06-12 (×2): 600 mg via ORAL
  Filled 2020-06-11 (×2): qty 2

## 2020-06-11 MED ORDER — SODIUM CHLORIDE 0.9 % IV BOLUS
1000.0000 mL | Freq: Once | INTRAVENOUS | Status: AC
  Administered 2020-06-11: 1000 mL via INTRAVENOUS

## 2020-06-11 MED ORDER — AZITHROMYCIN 250 MG PO TABS: 250.0000 mg | ORAL_TABLET | Freq: Every day | ORAL | 0 refills | Status: DC

## 2020-06-11 MED ORDER — HYDROXYZINE HCL 50 MG PO TABS: 50.0000 mg | ORAL_TABLET | Freq: Every day | ORAL | 0 refills | Status: DC

## 2020-06-11 MED ORDER — AZITHROMYCIN 500 MG PO TABS
250.0000 mg | ORAL_TABLET | Freq: Every day | ORAL | Status: DC
  Administered 2020-06-11 – 2020-06-12 (×2): 250 mg via ORAL
  Filled 2020-06-11 (×2): qty 1

## 2020-06-11 MED ORDER — CEFDINIR 300 MG PO CAPS: 600.0000 mg | ORAL_CAPSULE | Freq: Every day | ORAL | 0 refills | Status: DC

## 2020-06-11 MED ORDER — IRBESARTAN 75 MG PO TABS
75.0000 mg | ORAL_TABLET | Freq: Every day | ORAL | Status: DC
  Administered 2020-06-11 – 2020-06-12 (×2): 75 mg via ORAL
  Filled 2020-06-11 (×2): qty 1

## 2020-06-11 NOTE — Progress Notes (Addendum)
Physical Therapy Treatment Patient Details Name: [name] MRN: [id number] DOB: [birth year] Today's Date: 06/11/2020    History of Present Illness 59 yo female with pending ACDF was brought to ED for worsening SOB with asthma history, has been on bipap with some anxiety today.  Recent trip to ED for same.  PMHx:  COPD, HTN, asthma, fibromyalgia, PVD, cervical radiculopathy, migraines    PT Comments    Pt in bed upon arrival of PT, agreeable to session with focus on progressing functional ambulation and safety with mobility. The pt was able to complete multiple transfers through the session and 2 short bouts of ambulation in the room, but remains dependent on minA for mobility, and even had 1 LOB requiring modA to recover despite use of RW. After ambulation, the pt reported she felt "drunk", BP was 104/76 and the pt was returned to bed where she reported the "drunk" feeling subsided. The pt will continue to benefit from skilled PT to improve functional safety with mobility and transfers, reducing need for physical assistance, and improving activity tolerance prior to d/c home.     Follow Up Recommendations  SNF (currently, I feel the pt would benefit from more rehab given her performance today. If the pt is going home with family, HHPT, 24/7 supervision, and hands-on assist with all mobility will be needed.)     Equipment Recommendations  Rolling walker with 5" wheels    Recommendations for Other Services       Precautions / Restrictions Precautions Precautions: Fall Precaution Comments: monitor vitals Restrictions Weight Bearing Restrictions: No    Mobility  Bed Mobility Overal bed mobility: Needs Assistance Bed Mobility: Supine to Sit;Sit to Supine     Supine to sit: Min guard Sit to supine: Min guard   General bed mobility comments: pt able to come to sitting EOB without assist, some management of lines  Transfers Overall transfer level: Needs  assistance Equipment used: Rolling walker (2 wheeled) Transfers: Sit to/from Stand Sit to Stand: Min assist         General transfer comment: minA to power up and steady, especially with tremors inBLE  Ambulation/Gait Ambulation/Gait assistance: Min assist Gait Distance (Feet): 20 Feet (x2) Assistive device: Rolling walker (2 wheeled) Gait Pattern/deviations: Step-through pattern;Decreased step length - right;Decreased step length - left;Narrow base of support Gait velocity: decr Gait velocity interpretation: <1.31 ft/sec, indicative of household ambulator General Gait Details: Assist for balance and support. Pt with tremors in all 4 extremities causing instability. 1 LOB requiring modA to recover.         Balance Overall balance assessment: Needs assistance Sitting-balance support: No upper extremity supported;Feet supported Sitting balance-Leahy Scale: Fair     Standing balance support: Bilateral upper extremity supported;During functional activity Standing balance-Leahy Scale: Poor Standing balance comment: walker and min assist for static standing                            Cognition Arousal/Alertness: Awake/alert Behavior During Therapy: Flat affect;WFL for tasks assessed/performed Overall Cognitive Status: Within Functional Limits for tasks assessed                                 General Comments: generally WFL, pt with slightly decreased insight to safety awareness.      Exercises      General Comments General comments (skin integrity, edema, etc.): After ambulation, pt  reports feeling "drunk" BP measured 104/76 seated EOB. SpO2 >90 on RA      Pertinent Vitals/Pain Pain Assessment: Faces Faces Pain Scale: Hurts a little bit Pain Location: generalized Pain Descriptors / Indicators: Grimacing;Guarding Pain Intervention(s): Limited activity within patient's tolerance;Monitored during session           PT Goals (current goals  can now be found in the care plan section) Acute Rehab PT Goals Patient Stated Goal: to walk and get home PT Goal Formulation: With patient Time For Goal Achievement: 06/23/20 Potential to Achieve Goals: Good Progress towards PT goals: Progressing toward goals    Frequency    Min 3X/week      PT Plan Current plan remains appropriate       AM-PAC PT "6 Clicks" Mobility   Outcome Measure  Help needed turning from your back to your side while in a flat bed without using bedrails?: None Help needed moving from lying on your back to sitting on the side of a flat bed without using bedrails?: A Little Help needed moving to and from a bed to a chair (including a wheelchair)?: A Little Help needed standing up from a chair using your arms (e.g., wheelchair or bedside chair)?: A Little Help needed to walk in hospital room?: A Little Help needed climbing 3-5 steps with a railing? : A Lot 6 Click Score: 18    End of Session Equipment Utilized During Treatment: Gait belt Activity Tolerance: Patient limited by fatigue;Other (comment) (dizziness) Patient left: with call bell/phone within reach;in bed;with bed alarm set Nurse Communication: Mobility status (pt reports of feeling "drunk" and pt wanting to know if any medications had been changed) PT Visit Diagnosis: Unsteadiness on feet (R26.81);Muscle weakness (generalized) (M62.81)     Time: 8250-0370 PT Time Calculation (min) (ACUTE ONLY): 23 min  Charges:  $Gait Training: 23-37 mins                     Karma Ganja, PT, DPT   Acute Rehabilitation Department Pager #: [phone]   Otho Bellows 06/11/2020, 4:12 PM

## 2020-06-11 NOTE — Progress Notes (Signed)
PROGRESS NOTE    [name]  ZOX:[id number] DOB: 1961-06-15 DOA: 06/09/2020 PCP: Biagio Borg, MD     Brief Narrative:  59 y.o. BF PMHx seizure, anemia, bipolar DO, depression, fibromyalgia, HTN, PVD, asthma / COPD overlap, HTN.  Prediabetes.  Pt has cervical radiculopathy and was scheduled for ACDF with Dr. Arnoldo Morale today.  Pt with severe asthma history, is on numerous preventative meds, sees an asthma/allergy specialist, and is on [place] every other week.  She is actually due for her Dupixent shot today.  Pt has been having progressively worsening SOB for past few days.  Wheezing.  No fever, no productive cough.  Seen in ED on 6/19.  Started on prednisone.  Symptoms persisted and worsened.  In to ED with respiratory distress today.   ED Course: EMS gave solumedrol, mag, 10mg  albuterol, 0.5 atrovent.  In ED they gave another 10mg  albuterol, mag, 0.5 atrovent.  Despite this, pt still SOB, blowing just 50 on the FVC meter.  Now getting another 5mg  albuterol.  CXR neg.   Subjective: 6/26 A/O x4, negative CP, negative S OB, negative N/V.  Positive fatigue, positive lightheadedness with ambulation.   Assessment & Plan: Covid vaccination; positive vaccination   Principal Problem:   Status asthmaticus Active Problems:   HTN (hypertension)   COPD/asthma   Bipolar I disorder (HCC)   Cervical radiculopathy   Hypotension due to medication  Status asthmaticus/COPD exacerbation -Titrate BiPAP to maintain PO2> 60 -Solu-Medrol 60 mg BID -DuoNeb QID -Flutter valve -Incentive spirometry -Respiratory virus panel pending -Ativan PRN   Essential HTN/Hypotension due to medication -6/26 decrease irbesartan 75 mg daily; discontinue  HCTZ 25 mg daily -6/26 discontinue metoprolol IV 5 mg QID -6/26 discontinue all sedating medication -6/26 bolus 1 L normal saline, then resume normal saline at 56ml/hr -Orthostatic vitals q shift  Chest pain -6/25 EKG; NSR with  some sinus arrhythmia -Trend troponin -Echocardiogram; WNL see results below   Bipolar DO/Depression/ Fibromyalgia, -Does not appear home any home medication.  Seizures -Carbamazepine 400 mg BID  Cervical radiculopathy -Per EMR Dr. Arnoldo Morale was informed patient was admitted and therefore her surgery would be canceled.    DVT prophylaxis: Lovenox Code Status: Full Family Communication: 6/26 spoke with [name] daughter and let her know that discharge will be delayed at least by 24 hours.  Discussed plan of care answered all questions  Status is: Inpatient    Dispo: The patient is from: Home              Anticipated d/c is to: Home              Anticipated d/c date is: 6/30              Patient currently unstable COPD exacerbation      Consultants:    Procedures/Significant Events:  6/25 echocardiogram;Left Ventricle: LVEF= 60 to 65%. The left ventricle has normal function.    .   I have personally reviewed and interpreted all radiology studies and my findings are as above.  VENTILATOR SETTINGS:    Cultures 6/24 SARS coronavirus negative   Antimicrobials: Anti-infectives (From admission, onward)   Start     Ordered Stop   06/09/20 1330  azithromycin (ZITHROMAX) 500 mg in sodium chloride 0.9 % 250 mL IVPB     Discontinue     06/09/20 1229     06/09/20 0430  cefTRIAXone (ROCEPHIN) 1 g in sodium chloride 0.9 % 100 mL IVPB     Discontinue  06/09/20 0427 06/14/20 0559        Devices    LINES / TUBES:      Continuous Infusions: . sodium chloride 75 mL/hr at 06/09/20 1320     Objective: Vitals:   06/11/20 0006 06/11/20 0427 06/11/20 0725 06/11/20 1623  BP: 126/71 132/82 125/62 107/65  Pulse: 63 65 65   Resp: 13 18 16    Temp: 97.9 F (36.6 C) 97.8 F (36.6 C) 98.4 F (36.9 C) 98 F (36.7 C)  TempSrc: Axillary Oral Oral Oral  SpO2: 95% 96% 94%   Weight:      Height:        Intake/Output Summary (Last 24 hours) at 06/11/2020 1909 Last  data filed at 06/11/2020 0230 Gross per 24 hour  Intake --  Output 300 ml  Net -300 ml   Filed Weights   06/09/20 0049 06/10/20 0544  Weight: 68.9 kg 75.4 kg   Physical Exam:  General: A/O x4 no acute respiratory distress, when she stands feels lightheaded Eyes: negative scleral hemorrhage, negative anisocoria, negative icterus ENT: Negative Runny nose, negative gingival bleeding, Neck:  Negative scars, masses, torticollis, lymphadenopathy, JVD Lungs: Clear to auscultation bilaterally without wheezes or crackles Cardiovascular: Regular rate and rhythm without murmur gallop or rub normal S1 and S2 Abdomen: negative abdominal pain, nondistended, positive soft, bowel sounds, no rebound, no ascites, no appreciable mass Extremities: No significant cyanosis, clubbing, or edema bilateral lower extremities Skin: Negative rashes, lesions, ulcers Psychiatric:  Negative depression, negative anxiety, negative fatigue, negative mania  Central nervous system:  Cranial nerves II through XII intact, tongue/uvula midline, all extremities muscle strength 5/5, sensation intact throughout, negative dysarthria, negative expressive aphasia, negative receptive aphasia.  .     Data Reviewed: Care during the described time interval was provided by me .  I have reviewed this patient's available data, including medical history, events of note, physical examination, and all test results as part of my evaluation.  CBC: Recent Labs  Lab 06/07/20 1002 06/07/20 1002 06/09/20 0045 06/09/20 0120 06/09/20 1143 06/10/20 0819 06/11/20 0842  WBC 17.8*  --  16.5*  --   --  28.0* 21.2*  NEUTROABS  --   --  9.2*  --   --  22.7* 15.8*  HGB 11.2*   < > 10.0* 10.9* 11.6* 10.6* 10.4*  HCT 36.1   < > 32.0* 32.0* 34.0* 33.1* 32.5*  MCV 88.7  --  91.4  --   --  88.3 86.7  PLT 394  --  340  --   --  380 329   < > = values in this interval not displayed.   Basic Metabolic Panel: Recent Labs  Lab 06/07/20 1002  06/07/20 1002 06/09/20 0045 06/09/20 0120 06/09/20 1143 06/10/20 0819 06/11/20 0842  NA 140   < > 143 142 139 137 138  K 4.5   < > 3.7 3.5 4.4 4.4 4.0  CL 104  --  109  --   --  102 100  CO2 27  --  26  --   --  26 28  GLUCOSE 123*  --  118*  --   --  110* 85  BUN 17  --  11  --   --  17 20  CREATININE 0.67  --  0.65  --   --  0.71 0.64  CALCIUM 9.2  --  8.0*  --   --  8.8* 8.7*  MG  --   --   --   --   --  2.0 2.0  PHOS  --   --   --   --   --  3.7 3.4   < > = values in this interval not displayed.   GFR: Estimated Creatinine Clearance: 75.4 mL/min (by C-G formula based on SCr of 0.64 mg/dL). Liver Function Tests: Recent Labs  Lab 06/07/20 1002 06/09/20 0045 06/10/20 0819 06/11/20 0842  AST 28 21 17  13*  ALT 48* 33 35 30  ALKPHOS 74 59 68 60  BILITOT 0.5 0.5 0.4 0.4  PROT 6.3* 5.1* 5.8* 5.4*  ALBUMIN 3.8 3.1* 3.3* 3.1*   No results for input(s): LIPASE, AMYLASE in the last 168 hours. No results for input(s): AMMONIA in the last 168 hours. Coagulation Profile: No results for input(s): INR, PROTIME in the last 168 hours. Cardiac Enzymes: No results for input(s): CKTOTAL, CKMB, CKMBINDEX, TROPONINI in the last 168 hours. BNP (last 3 results) No results for input(s): PROBNP in the last 8760 hours. HbA1C: No results for input(s): HGBA1C in the last 72 hours. CBG: No results for input(s): GLUCAP in the last 168 hours. Lipid Profile: No results for input(s): CHOL, HDL, LDLCALC, TRIG, CHOLHDL, LDLDIRECT in the last 72 hours. Thyroid Function Tests: No results for input(s): TSH, T4TOTAL, FREET4, T3FREE, THYROIDAB in the last 72 hours. Anemia Panel: No results for input(s): VITAMINB12, FOLATE, FERRITIN, TIBC, IRON, RETICCTPCT in the last 72 hours. Sepsis Labs: No results for input(s): PROCALCITON, LATICACIDVEN in the last 168 hours.  Recent Results (from the past 240 hour(s))  Surgical pcr screen     Status: None   Collection Time: 06/07/20 10:02 AM   Specimen: Nasal  Mucosa; Nasal Swab  Result Value Ref Range Status   MRSA, PCR NEGATIVE NEGATIVE Final   Staphylococcus aureus NEGATIVE NEGATIVE Final    Comment: (NOTE) The Xpert SA Assay (FDA approved for NASAL specimens in patients 59 years of age and older), is one component of a comprehensive surveillance program. It is not intended to diagnose infection nor to guide or monitor treatment. Performed at [facility], [place] [address], [place], [place] [address]   SARS CORONAVIRUS 2 (TAT 6-24 HRS) Nasopharyngeal Nasopharyngeal Swab     Status: None   Collection Time: 06/07/20 10:41 AM   Specimen: Nasopharyngeal Swab  Result Value Ref Range Status   SARS Coronavirus 2 NEGATIVE NEGATIVE Final    Comment: (NOTE) SARS-CoV-2 target nucleic acids are NOT DETECTED.  The SARS-CoV-2 RNA is generally detectable in upper and lower respiratory specimens during the acute phase of infection. Negative results do not preclude SARS-CoV-2 infection, do not rule out co-infections with other pathogens, and should not be used as the sole basis for treatment or other patient management decisions. Negative results must be combined with clinical observations, patient history, and epidemiological information. The expected result is Negative.  Fact Sheet for Patients: SugarRoll.be  Fact Sheet for Healthcare Providers: https://www.-mathews.com/  This test is not yet approved or cleared by the [place] FDA and  has been authorized for detection and/or diagnosis of SARS-CoV-2 by FDA under an Emergency Use Authorization (EUA). This EUA will remain  in effect (meaning this test can be used) for the duration of the COVID-19 declaration under Se ction 564(b)(1) of the Act, 21 U.S.C. section 360bbb-3(b)(1), unless the authorization is terminated or revoked sooner.  Performed at [facility], [place] [address] [place], [place] [address]   SARS Coronavirus 2 by RT  PCR (hospital order, performed in [facility] hospital lab) Nasopharyngeal  Nasopharyngeal Swab     Status: None   Collection Time: 06/09/20  4:59 AM   Specimen: Nasopharyngeal Swab  Result Value Ref Range Status   SARS Coronavirus 2 NEGATIVE NEGATIVE Final    Comment: (NOTE) SARS-CoV-2 target nucleic acids are NOT DETECTED.  The SARS-CoV-2 RNA is generally detectable in upper and lower respiratory specimens during the acute phase of infection. The lowest concentration of SARS-CoV-2 viral copies this assay can detect is 250 copies / mL. A negative result does not preclude SARS-CoV-2 infection and should not be used as the sole basis for treatment or other patient management decisions.  A negative result may occur with improper specimen collection / handling, submission of specimen other than nasopharyngeal swab, presence of viral mutation(s) within the areas targeted by this assay, and inadequate number of viral copies (<250 copies / mL). A negative result must be combined with clinical observations, patient history, and epidemiological information.  Fact Sheet for Patients:   StrictlyIdeas.no  Fact Sheet for Healthcare Providers: BankingDealers.co.za  This test is not yet approved or  cleared by the [place] FDA and has been authorized for detection and/or diagnosis of SARS-CoV-2 by FDA under an Emergency Use Authorization (EUA).  This EUA will remain in effect (meaning this test can be used) for the duration of the COVID-19 declaration under Section 564(b)(1) of the Act, 21 U.S.C. section 360bbb-3(b)(1), unless the authorization is terminated or revoked sooner.  Performed at [facility], [place] [address], [place], [place] [address]   Respiratory Panel by PCR     Status: None   Collection Time: 06/09/20  1:26 PM   Specimen: Nasopharyngeal Swab; Respiratory  Result Value Ref Range Status   Adenovirus NOT DETECTED NOT  DETECTED Final   Coronavirus 229E NOT DETECTED NOT DETECTED Final    Comment: (NOTE) The Coronavirus on the Respiratory Panel, DOES NOT test for the novel  Coronavirus (2019 nCoV)    Coronavirus HKU1 NOT DETECTED NOT DETECTED Final   Coronavirus NL63 NOT DETECTED NOT DETECTED Final   Coronavirus OC43 NOT DETECTED NOT DETECTED Final   Metapneumovirus NOT DETECTED NOT DETECTED Final   Rhinovirus / Enterovirus NOT DETECTED NOT DETECTED Final   Influenza A NOT DETECTED NOT DETECTED Final   Influenza B NOT DETECTED NOT DETECTED Final   Parainfluenza Virus 1 NOT DETECTED NOT DETECTED Final   Parainfluenza Virus 2 NOT DETECTED NOT DETECTED Final   Parainfluenza Virus 3 NOT DETECTED NOT DETECTED Final   Parainfluenza Virus 4 NOT DETECTED NOT DETECTED Final   Respiratory Syncytial Virus NOT DETECTED NOT DETECTED Final   Bordetella pertussis NOT DETECTED NOT DETECTED Final   Chlamydophila pneumoniae NOT DETECTED NOT DETECTED Final   Mycoplasma pneumoniae NOT DETECTED NOT DETECTED Final    Comment: Performed at [facility], [place]. [address], [place], [place] [address]         Radiology Studies: ECHOCARDIOGRAM COMPLETE  Result Date: 06/10/2020    ECHOCARDIOGRAM REPORT   Patient Name:   MIRACLE MONGILLO Date of Exam: 06/10/2020 Medical Rec #:  [id number]            Height:       63.0 in Accession #:    [id number]           Weight:       166.2 lb Date of Birth:  [birth year]             BSA:  1.787 m Patient Age:    19 years             BP:           104/72 mmHg Patient Gender: F                    HR:           89 bpm. Exam Location:  Inpatient Procedure: 2D Echo Indications:    chest pain  History:        Patient has prior history of Echocardiogram examinations, most                 recent 05/03/2017. COPD; Risk Factors:Hypertension.  Sonographer:    Johny Chess Referring Phys: [id number] [place]  1. Left ventricular ejection fraction, by estimation, is 60 to 65%.  The left ventricle has normal function. The left ventricle has no regional wall motion abnormalities. Left ventricular diastolic parameters were normal.  2. Right ventricular systolic function is normal. The right ventricular size is normal. Tricuspid regurgitation signal is inadequate for assessing PA pressure.  3. The mitral valve is normal in structure. Trivial mitral valve regurgitation. No evidence of mitral stenosis.  4. Aortic valve appears grossly normal. Unable to determine valve morphology due to image quality. Aortic valve regurgitation is not visualized. No aortic stenosis is present.  5. The inferior vena cava is normal in size with greater than 50% respiratory variability, suggesting right atrial pressure of 3 mmHg. FINDINGS  Left Ventricle: Left ventricular ejection fraction, by estimation, is 60 to 65%. The left ventricle has normal function. The left ventricle has no regional wall motion abnormalities. The left ventricular internal cavity size was normal in size. There is  no left ventricular hypertrophy. Left ventricular diastolic parameters were normal. Right Ventricle: The right ventricular size is normal. No increase in right ventricular wall thickness. Right ventricular systolic function is normal. Tricuspid regurgitation signal is inadequate for assessing PA pressure. Left Atrium: Left atrial size was normal in size. Right Atrium: Right atrial size was normal in size. Pericardium: There is no evidence of pericardial effusion. Mitral Valve: The mitral valve is normal in structure. Normal mobility of the mitral valve leaflets. Trivial mitral valve regurgitation. No evidence of mitral valve stenosis. Tricuspid Valve: The tricuspid valve is normal in structure. Tricuspid valve regurgitation is trivial. No evidence of tricuspid stenosis. Aortic Valve: Aortic valve appears grossly normal. Unable to determine valve morphology due to image quality. Aortic valve regurgitation is not visualized. No  aortic stenosis is present. Pulmonic Valve: The pulmonic valve was not well visualized. Pulmonic valve regurgitation is trivial. No evidence of pulmonic stenosis. Aorta: The aortic root is normal in size and structure. Venous: The inferior vena cava is normal in size with greater than 50% respiratory variability, suggesting right atrial pressure of 3 mmHg. IAS/Shunts: No atrial level shunt detected by color flow Doppler.  LEFT VENTRICLE PLAX 2D LVIDd:         4.50 cm  Diastology LVIDs:         3.20 cm  LV e' lateral:   12.60 cm/s LV PW:         1.00 cm  LV E/e' lateral: 7.7 LV IVS:        0.70 cm  LV e' medial:    9.25 cm/s LVOT diam:     1.90 cm  LV E/e' medial:  10.5 LV SV:         64  LV SV Index:   36 LVOT Area:     2.84 cm  RIGHT VENTRICLE             IVC RV S prime:     12.90 cm/s  IVC diam: 1.30 cm TAPSE (M-mode): 2.6 cm LEFT ATRIUM             Index       RIGHT ATRIUM           Index LA diam:        2.90 cm 1.62 cm/m  RA Area:     12.40 cm LA Vol (A2C):   35.0 ml 19.58 ml/m RA Volume:   28.40 ml  15.89 ml/m LA Vol (A4C):   33.8 ml 18.91 ml/m LA Biplane Vol: 34.4 ml 19.25 ml/m  AORTIC VALVE LVOT Vmax:   123.00 cm/s LVOT Vmean:  75.100 cm/s LVOT VTI:    0.224 m  AORTA Ao Root diam: 2.80 cm Ao Asc diam:  3.00 cm MITRAL VALVE MV Area (PHT): 4.06 cm    SHUNTS MV Decel Time: 187 msec    Systemic VTI:  0.22 m MV E velocity: 96.70 cm/s  Systemic Diam: 1.90 cm MV A velocity: 77.10 cm/s MV E/A ratio:  1.25 Cherlynn Kaiser MD Electronically signed by Cherlynn Kaiser MD Signature Date/Time: 06/10/2020/9:32:07 PM    Final         Scheduled Meds: . azithromycin  250 mg Oral Daily  . carbamazepine  400 mg Oral BID  . cefdinir  600 mg Oral Daily  . cholecalciferol  5,000 Units Oral Daily  . diclofenac Sodium  4 g Topical QID  . enoxaparin (LOVENOX) injection  40 mg Subcutaneous Q24H  . famotidine  20 mg Oral BID  . hydrOXYzine  50 mg Oral QHS  . ipratropium-albuterol  3 mL Nebulization TID  .  irbesartan  75 mg Oral Daily  . loratadine  10 mg Oral Daily  . mometasone-formoterol  1 puff Inhalation BID  . montelukast  10 mg Oral QHS  . pantoprazole  40 mg Oral Daily  . pravastatin  40 mg Oral q1800  . umeclidinium bromide  1 puff Inhalation Daily   Continuous Infusions: . sodium chloride 75 mL/hr at 06/09/20 1320     LOS: 1 day    Time spent:40 min    Benard Minturn, Geraldo Docker, MD [facility] Pager [phone]  If 7PM-7AM, please contact night-coverage [email or web address] Password [facility] 06/11/2020, 7:09 PM

## 2020-06-11 NOTE — Progress Notes (Signed)
Patient currently on 3L [place], Bipap not needed at this time. RT will continue to monitor.

## 2020-06-11 NOTE — Discharge Summary (Addendum)
Physician Discharge Summary  [name] GDJ:[id number] DOB: 02/22/1961 DOA: 06/09/2020  PCP: Felicia Borg, MD  Admit date: 06/09/2020 Discharge date: 06/12/2020  Time spent: 30 minutes  Recommendations for Outpatient Follow-up:   Covid vaccination; positive vaccination   Status asthmaticus/COPD exacerbation -Titrate BiPAP to maintain PO2> 60 -Home meds -Flutter valve -Incentive spirometry -Respiratory virus panel negative -Complete 5-day course of antibiotics at home  Essential HTN -Irbesartan 75 mg daily -PCP to decide when/if to restart additional BP medication -PCP to decide when/if to restart sedating medication -Follow-up with Dr. Cathlean [name] in 2 weeks COPD exacerbation, essential HTN, noncardiac chest pain.  Chest pain noncardiac -6/25 EKG; NSR with some sinus arrhythmia -Trend troponin Results for Felicia, [name] (MRN [id number]) as of 06/11/2020 10:18  Ref. Range 06/03/2020 23:42 06/04/2020 03:32 06/10/2020 08:19 06/10/2020 10:01  Troponin I (High Sensitivity) Latest Ref Range: <18 ng/L 5 8 <2 3  -Echocardiogram; WNL see results below   Bipolar DO/Depression/ Fibromyalgia, -Does not appear home any home medication.  Seizures -Carbamazepine 400 mg BID  Cervical radiculopathy -Per EMR [name] was informed patient was admitted and therefore her surgery would be canceled. -Patient to reschedule surgery with [name]   Discharge Diagnoses:  Principal Problem:   Status asthmaticus Active Problems:   HTN (hypertension)   COPD/asthma   Bipolar I disorder ([place])   Cervical radiculopathy   Hypotension due to medication   Discharge Condition: Stable  Diet recommendation: Heart healthy  Filed Weights   06/09/20 0049 06/10/20 0544  Weight: 68.9 kg 75.4 kg    History of present illness:  59 y.o.BF PMHx seizure, anemia, bipolar DO, depression, fibromyalgia, HTN, PVD, asthma / COPD overlap, HTN.  Prediabetes.  Pt has cervical radiculopathy  and was scheduled for ACDF with [name] today.  Pt with severe asthma history, is on numerous preventative meds, sees an asthma/allergy specialist, and is on Excel every other week. She is actually due for her Dupixent shot today.  Pt has been having progressively worsening SOB for past few days. Wheezing. No fever, no productive cough. Seen in ED on 6/19. Started on prednisone.  Symptoms persisted and worsened. In to ED with respiratory distress today.   ED Course:EMS gave solumedrol, mag, 10mg  albuterol, 0.5 atrovent.  In ED they gave another 10mg  albuterol, mag, 0.5 atrovent.  Despite this, pt still SOB, blowing just 50 on the FVC meter. Now getting another 5mg  albuterol.  CXR neg.  Hospital Course:  See above  Procedures: 6/25 echocardiogram;Left Ventricle: LVEF= 60 to 65%. The left ventricle has normal function   Cultures  6/24 SARS coronavirus negative  6/24 respiratory virus panel negative   Antibiotics Anti-infectives (From admission, onward)   Start     Dose/Rate Ordered Stop   06/11/20 1000  azithromycin (ZITHROMAX) tablet 250 mg     Discontinue     250 mg 06/11/20 0805 06/14/20 0959   06/11/20 1000  cefdinir (OMNICEF) capsule 600 mg     Discontinue     600 mg 06/11/20 0805 06/14/20 0959   06/09/20 1330  azithromycin (ZITHROMAX) 500 mg in sodium chloride 0.9 % 250 mL IVPB  Status:  Discontinued        500 mg 250 mL/hr over 60 Minutes 06/09/20 1229 06/11/20 0805   06/09/20 0430  cefTRIAXone (ROCEPHIN) 1 g in sodium chloride 0.9 % 100 mL IVPB  Status:  Discontinued        1 g 200 mL/hr over 30 Minutes 06/09/20 0427 06/11/20  0805       Discharge Exam: Vitals:   06/12/20 0032 06/12/20 0449 06/12/20 0800 06/12/20 0907  BP: 116/70 121/66 120/75 (!) 154/89  Pulse: 70 62 60 79  Resp: 15 15    Temp: 98.6 F (37 C) 97.7 F (36.5 C) 97.6 F (36.4 C)   TempSrc: Axillary Axillary Axillary   SpO2:  98% 94% (!) 89%  Weight:      Height:         General: A/O x4, positive acute respiratory distress Eyes: negative scleral hemorrhage, negative anisocoria, negative icterus ENT: Negative Runny nose, negative gingival bleeding, Neck:  Negative scars, masses, torticollis, lymphadenopathy, JVD Lungs: Clear to auscultation bilaterally  Discharge Instructions   Allergies as of 06/12/2020      Reactions   Codeine Nausea And Vomiting   Sulfa Antibiotics Nausea And Vomiting   Sulfonamide Derivatives Nausea And Vomiting   REACTION: n/v      Medication List    STOP taking these medications   clindamycin 1 % gel Commonly known as: Clindagel   Dupixent 300 MG/2ML prefilled syringe Generic drug: dupilumab   HYDROcodone-acetaminophen 5-325 MG tablet Commonly known as: NORCO/VICODIN   hydrOXYzine 25 MG capsule Commonly known as: Vistaril Replaced by: hydrOXYzine 50 MG tablet   irbesartan-hydrochlorothiazide 150-12.5 MG tablet Commonly known as: AVALIDE   predniSONE 20 MG tablet Commonly known as: DELTASONE     TAKE these medications   albuterol (2.5 MG/3ML) 0.083% nebulizer solution Commonly known as: PROVENTIL Take 3 mLs (2.5 mg total) by nebulization every 4 (four) hours as needed for wheezing or shortness of breath. Reported on 06/04/2016 What changed: additional instructions   albuterol 108 (90 Base) MCG/ACT inhaler Commonly known as: ProAir HFA Inhale 4 puffs into the lungs every 4 (four) hours as needed for wheezing or shortness of breath. What changed:   how much to take  when to take this   azithromycin 250 MG tablet Commonly known as: ZITHROMAX Take 1 tablet (250 mg total) by mouth daily.   BC Fast Pain Relief Arthritis 1000-65 MG Pack Generic drug: Aspirin-Caffeine Take 1 Package by mouth daily as needed (Knee pain).   BC HEADACHE POWDER PO Take 1 Package by mouth daily as needed (Headache).   Biotin 1000 MCG tablet Take 1,000 mcg by mouth daily.   Breztri Aerosphere 160-9-4.8 MCG/ACT  Aero Generic drug: Budeson-Glycopyrrol-Formoterol Inhale 2 puffs into the lungs 2 (two) times daily. Use with spacer. What changed: when to take this   budesonide-formoterol 160-4.5 MCG/ACT inhaler Commonly known as: SYMBICORT Inhale 2 puffs into the lungs daily.   carbamazepine 200 MG tablet Commonly known as: TEGretol Take 2 tablets (400 mg total) by mouth 2 (two) times daily.   cefdinir 300 MG capsule Commonly known as: OMNICEF Take 2 capsules (600 mg total) by mouth daily.   diclofenac Sodium 1 % Gel Commonly known as: VOLTAREN Apply 4 g topically 4 (four) times daily.   estradiol 0.5 MG tablet Commonly known as: ESTRACE Take 1 tablet (0.5 mg total) by mouth daily.   famotidine 20 MG tablet Commonly known as: Pepcid Take 1 tablet (20 mg total) by mouth 2 (two) times daily.   fluticasone 50 MCG/ACT nasal spray Commonly known as: FLONASE Place 1 spray into both nostrils 2 (two) times daily. What changed:   when to take this  reasons to take this   hydrochlorothiazide 12.5 MG capsule Commonly known as: MICROZIDE Take 1 capsule (12.5 mg total) by mouth daily.   hydrOXYzine  50 MG tablet Commonly known as: ATARAX/VISTARIL Take 1 tablet (50 mg total) by mouth at bedtime. Replaces: hydrOXYzine 25 MG capsule   irbesartan 75 MG tablet Commonly known as: AVAPRO Take 1 tablet (75 mg total) by mouth daily.   lactulose (encephalopathy) 10 GM/15ML Soln Commonly known as: Generlac TAKE 45 TO 90 ML( 30-60 GM) BY MOUTH TWICE DAILY AS NEEDED What changed:   how much to take  how to take this  when to take this   levocetirizine 5 MG tablet Commonly known as: XYZAL Take 1 tablet (5 mg total) by mouth 2 (two) times daily. What changed: when to take this   meclizine 12.5 MG tablet Commonly known as: ANTIVERT TAKE 1 TABLET(12.5 MG) BY MOUTH THREE TIMES DAILY AS NEEDED FOR DIZZINESS What changed: See the new instructions.   montelukast 10 MG tablet Commonly known  as: SINGULAIR Take 1 tablet (10 mg total) by mouth at bedtime.   pantoprazole 40 MG tablet Commonly known as: PROTONIX 1 by mouth once daily What changed:   how much to take  how to take this  when to take this  additional instructions   pravastatin 40 MG tablet Commonly known as: PRAVACHOL TAKE 1 TABLET(40 MG) BY MOUTH DAILY What changed:   how much to take  how to take this  when to take this  additional instructions   Spiriva Respimat 2.5 MCG/ACT Aers Generic drug: Tiotropium Bromide Monohydrate Inhale 2 puffs into the lungs daily.   triamcinolone cream 0.1 % Commonly known as: KENALOG APPLY EXTERNALLY TO THE AFFECTED AREA TWICE DAILY What changed: See the new instructions.   vitamin C 500 MG tablet Commonly known as: ASCORBIC ACID Take 500 mg by mouth daily.   Vitamin D 125 MCG (5000 UT) Caps Take 5,000 Units by mouth daily.   vitamin E 180 MG (400 UNITS) capsule Take 400 Units by mouth daily.            Durable Medical Equipment  (From admission, onward)         Start     Ordered   06/11/20 1154  For home use only DME 3 n 1  Once        06/11/20 1156         Allergies  Allergen Reactions  . Codeine Nausea And Vomiting  . Sulfa Antibiotics Nausea And Vomiting  . Sulfonamide Derivatives Nausea And Vomiting    REACTION: n/v    Follow-up Information    Felicia Borg, MD. Schedule an appointment as soon as possible for a visit in 2 week(s).   Specialties: Internal Medicine, Radiology Why: Follow-up with Dr. Cathlean [name] in 2 weeks COPD exacerbation, essential HTN, noncardiac chest pain Contact information: [place] [phone]                The results of significant diagnostics from this hospitalization (including imaging, microbiology, ancillary and laboratory) are listed below for reference.    Significant Diagnostic Studies: DG Chest 2 View  Result Date: 06/04/2020 CLINICAL DATA:   Shortness of breath EXAM: CHEST - 2 VIEW COMPARISON:  None. FINDINGS: The heart size and mediastinal contours are within normal limits. Aortic knob calcifications. Both lungs are clear. The visualized skeletal structures are unremarkable. Cervical fixation hardware is seen. IMPRESSION: No active cardiopulmonary disease. Electronically Signed   By: Prudencio Pair M.D.   On: 06/04/2020 00:04   DG Chest Port 1 View  Result Date: 06/09/2020 CLINICAL DATA:  Shortness of breath EXAM: PORTABLE CHEST 1 VIEW COMPARISON:  06/03/2020 FINDINGS: The heart size and mediastinal contours are within normal limits. Both lungs are clear. The visualized skeletal structures are unremarkable. IMPRESSION: No active disease. Electronically Signed   By: Ulyses Jarred M.D.   On: 06/09/2020 01:00   ECHOCARDIOGRAM COMPLETE  Result Date: 06/10/2020    ECHOCARDIOGRAM REPORT   Patient Name:   [name] Date of Exam: 06/10/2020 Medical Rec #:  [id number]            Height:       63.0 in Accession #:    [id number]           Weight:       166.2 lb Date of Birth:  01-14-1961             BSA:          1.787 m Patient Age:    8 years             BP:           104/72 mmHg Patient Gender: F                    HR:           89 bpm. Exam Location:  Inpatient Procedure: 2D Echo Indications:    chest pain  History:        Patient has prior history of Echocardiogram examinations, most                 recent 05/03/2017. COPD; Risk Factors:Hypertension.  Sonographer:    Johny Chess Referring Phys: [id number] [place]  1. Left ventricular ejection fraction, by estimation, is 60 to 65%. The left ventricle has normal function. The left ventricle has no regional wall motion abnormalities. Left ventricular diastolic parameters were normal.  2. Right ventricular systolic function is normal. The right ventricular size is normal. Tricuspid regurgitation signal is inadequate for assessing PA pressure.  3. The mitral valve is normal in  structure. Trivial mitral valve regurgitation. No evidence of mitral stenosis.  4. Aortic valve appears grossly normal. Unable to determine valve morphology due to image quality. Aortic valve regurgitation is not visualized. No aortic stenosis is present.  5. The inferior vena cava is normal in size with greater than 50% respiratory variability, suggesting right atrial pressure of 3 mmHg. FINDINGS  Left Ventricle: Left ventricular ejection fraction, by estimation, is 60 to 65%. The left ventricle has normal function. The left ventricle has no regional wall motion abnormalities. The left ventricular internal cavity size was normal in size. There is  no left ventricular hypertrophy. Left ventricular diastolic parameters were normal. Right Ventricle: The right ventricular size is normal. No increase in right ventricular wall thickness. Right ventricular systolic function is normal. Tricuspid regurgitation signal is inadequate for assessing PA pressure. Left Atrium: Left atrial size was normal in size. Right Atrium: Right atrial size was normal in size. Pericardium: There is no evidence of pericardial effusion. Mitral Valve: The mitral valve is normal in structure. Normal mobility of the mitral valve leaflets. Trivial mitral valve regurgitation. No evidence of mitral valve stenosis. Tricuspid Valve: The tricuspid valve is normal in structure. Tricuspid valve regurgitation is trivial. No evidence of tricuspid stenosis. Aortic Valve: Aortic valve appears grossly normal. Unable to determine valve morphology due to image quality. Aortic valve regurgitation is not visualized. No aortic stenosis is present. Pulmonic Valve: The pulmonic valve was not well  visualized. Pulmonic valve regurgitation is trivial. No evidence of pulmonic stenosis. Aorta: The aortic root is normal in size and structure. Venous: The inferior vena cava is normal in size with greater than 50% respiratory variability, suggesting right atrial pressure of 3  mmHg. IAS/Shunts: No atrial level shunt detected by color flow Doppler.  LEFT VENTRICLE PLAX 2D LVIDd:         4.50 cm  Diastology LVIDs:         3.20 cm  LV e' lateral:   12.60 cm/s LV PW:         1.00 cm  LV E/e' lateral: 7.7 LV IVS:        0.70 cm  LV e' medial:    9.25 cm/s LVOT diam:     1.90 cm  LV E/e' medial:  10.5 LV SV:         64 LV SV Index:   36 LVOT Area:     2.84 cm  RIGHT VENTRICLE             IVC RV S prime:     12.90 cm/s  IVC diam: 1.30 cm TAPSE (M-mode): 2.6 cm LEFT ATRIUM             Index       RIGHT ATRIUM           Index LA diam:        2.90 cm 1.62 cm/m  RA Area:     12.40 cm LA Vol (A2C):   35.0 ml 19.58 ml/m RA Volume:   28.40 ml  15.89 ml/m LA Vol (A4C):   33.8 ml 18.91 ml/m LA Biplane Vol: 34.4 ml 19.25 ml/m  AORTIC VALVE LVOT Vmax:   123.00 cm/s LVOT Vmean:  75.100 cm/s LVOT VTI:    0.224 m  AORTA Ao Root diam: 2.80 cm Ao Asc diam:  3.00 cm MITRAL VALVE MV Area (PHT): 4.06 cm    SHUNTS MV Decel Time: 187 msec    Systemic VTI:  0.22 m MV E velocity: 96.70 cm/s  Systemic Diam: 1.90 cm MV A velocity: 77.10 cm/s MV E/A ratio:  1.25 Cherlynn Kaiser MD Electronically signed by Cherlynn Kaiser MD Signature Date/Time: 06/10/2020/9:32:07 PM    Final     Microbiology: Recent Results (from the past 240 hour(s))  Surgical pcr screen     Status: None   Collection Time: 06/07/20 10:02 AM   Specimen: Nasal Mucosa; Nasal Swab  Result Value Ref Range Status   MRSA, PCR NEGATIVE NEGATIVE Final   Staphylococcus aureus NEGATIVE NEGATIVE Final    Comment: (NOTE) The Xpert SA Assay (FDA approved for NASAL specimens in patients 52 years of age and older), is one component of a comprehensive surveillance program. It is not intended to diagnose infection nor to guide or monitor treatment. Performed at [facility], [place] [address] [place], [place] [address]   SARS CORONAVIRUS 2 (TAT 6-24 HRS) Nasopharyngeal Nasopharyngeal Swab     Status: None   Collection Time: 06/07/20 10:41  AM   Specimen: Nasopharyngeal Swab  Result Value Ref Range Status   SARS Coronavirus 2 NEGATIVE NEGATIVE Final    Comment: (NOTE) SARS-CoV-2 target nucleic acids are NOT DETECTED.  The SARS-CoV-2 RNA is generally detectable in upper and lower respiratory specimens during the acute phase of infection. Negative results do not preclude SARS-CoV-2 infection, do not rule out co-infections with other pathogens, and should not be used as the sole basis for treatment or  other patient management decisions. Negative results must be combined with clinical observations, patient history, and epidemiological information. The expected result is Negative.  Fact Sheet for Patients: SugarRoll.be  Fact Sheet for Healthcare Providers: https://www.-mathews.com/  This test is not yet approved or cleared by the [place] FDA and  has been authorized for detection and/or diagnosis of SARS-CoV-2 by FDA under an Emergency Use Authorization (EUA). This EUA will remain  in effect (meaning this test can be used) for the duration of the COVID-19 declaration under Se ction 564(b)(1) of the Act, 21 U.S.C. section 360bbb-3(b)(1), unless the authorization is terminated or revoked sooner.  Performed at [facility], [place] [address] [place], [place] [address]   SARS Coronavirus 2 by RT PCR (hospital order, performed in [facility] hospital lab) Nasopharyngeal Nasopharyngeal Swab     Status: None   Collection Time: 06/09/20  4:59 AM   Specimen: Nasopharyngeal Swab  Result Value Ref Range Status   SARS Coronavirus 2 NEGATIVE NEGATIVE Final    Comment: (NOTE) SARS-CoV-2 target nucleic acids are NOT DETECTED.  The SARS-CoV-2 RNA is generally detectable in upper and lower respiratory specimens during the acute phase of infection. The lowest concentration of SARS-CoV-2 viral copies this assay can detect is 250 copies / mL. A negative result does not preclude  SARS-CoV-2 infection and should not be used as the sole basis for treatment or other patient management decisions.  A negative result may occur with improper specimen collection / handling, submission of specimen other than nasopharyngeal swab, presence of viral mutation(s) within the areas targeted by this assay, and inadequate number of viral copies (<250 copies / mL). A negative result must be combined with clinical observations, patient history, and epidemiological information.  Fact Sheet for Patients:   StrictlyIdeas.no  Fact Sheet for Healthcare Providers: BankingDealers.co.za  This test is not yet approved or  cleared by the [place] FDA and has been authorized for detection and/or diagnosis of SARS-CoV-2 by FDA under an Emergency Use Authorization (EUA).  This EUA will remain in effect (meaning this test can be used) for the duration of the COVID-19 declaration under Section 564(b)(1) of the Act, 21 U.S.C. section 360bbb-3(b)(1), unless the authorization is terminated or revoked sooner.  Performed at [facility], [place] [address], [place], [place] [address]   Respiratory Panel by PCR     Status: None   Collection Time: 06/09/20  1:26 PM   Specimen: Nasopharyngeal Swab; Respiratory  Result Value Ref Range Status   Adenovirus NOT DETECTED NOT DETECTED Final   Coronavirus 229E NOT DETECTED NOT DETECTED Final    Comment: (NOTE) The Coronavirus on the Respiratory Panel, DOES NOT test for the novel  Coronavirus (2019 nCoV)    Coronavirus HKU1 NOT DETECTED NOT DETECTED Final   Coronavirus NL63 NOT DETECTED NOT DETECTED Final   Coronavirus OC43 NOT DETECTED NOT DETECTED Final   Metapneumovirus NOT DETECTED NOT DETECTED Final   Rhinovirus / Enterovirus NOT DETECTED NOT DETECTED Final   Influenza A NOT DETECTED NOT DETECTED Final   Influenza B NOT DETECTED NOT DETECTED Final   Parainfluenza Virus 1 NOT DETECTED NOT  DETECTED Final   Parainfluenza Virus 2 NOT DETECTED NOT DETECTED Final   Parainfluenza Virus 3 NOT DETECTED NOT DETECTED Final   Parainfluenza Virus 4 NOT DETECTED NOT DETECTED Final   Respiratory Syncytial Virus NOT DETECTED NOT DETECTED Final   Bordetella pertussis NOT DETECTED NOT DETECTED Final   Chlamydophila pneumoniae NOT DETECTED NOT  DETECTED Final   Mycoplasma pneumoniae NOT DETECTED NOT DETECTED Final    Comment: Performed at [facility], [place] [address] [place], [place] [address]     Labs: Basic Metabolic Panel: Recent Labs  Lab 06/07/20 1002 06/07/20 1002 06/09/20 0045 06/09/20 0120 06/09/20 1143 06/10/20 0819 06/11/20 0842  NA 140   < > 143 142 139 137 138  K 4.5   < > 3.7 3.5 4.4 4.4 4.0  CL 104  --  109  --   --  102 100  CO2 27  --  26  --   --  26 28  GLUCOSE 123*  --  118*  --   --  110* 85  BUN 17  --  11  --   --  17 20  CREATININE 0.67  --  0.65  --   --  0.71 0.64  CALCIUM 9.2  --  8.0*  --   --  8.8* 8.7*  MG  --   --   --   --   --  2.0 2.0  PHOS  --   --   --   --   --  3.7 3.4   < > = values in this interval not displayed.   Liver Function Tests: Recent Labs  Lab 06/07/20 1002 06/09/20 0045 06/10/20 0819 06/11/20 0842  AST 28 21 17  13*  ALT 48* 33 35 30  ALKPHOS 74 59 68 60  BILITOT 0.5 0.5 0.4 0.4  PROT 6.3* 5.1* 5.8* 5.4*  ALBUMIN 3.8 3.1* 3.3* 3.1*   No results for input(s): LIPASE, AMYLASE in the last 168 hours. No results for input(s): AMMONIA in the last 168 hours. CBC: Recent Labs  Lab 06/07/20 1002 06/07/20 1002 06/09/20 0045 06/09/20 0120 06/09/20 1143 06/10/20 0819 06/11/20 0842  WBC 17.8*  --  16.5*  --   --  28.0* 21.2*  NEUTROABS  --   --  9.2*  --   --  22.7* 15.8*  HGB 11.2*   < > 10.0* 10.9* 11.6* 10.6* 10.4*  HCT 36.1   < > 32.0* 32.0* 34.0* 33.1* 32.5*  MCV 88.7  --  91.4  --   --  88.3 86.7  PLT 394  --  340  --   --  380 329   < > = values in this interval not displayed.   Cardiac Enzymes: No  results for input(s): CKTOTAL, CKMB, CKMBINDEX, TROPONINI in the last 168 hours. BNP: BNP (last 3 results) No results for input(s): BNP in the last 8760 hours.  ProBNP (last 3 results) No results for input(s): PROBNP in the last 8760 hours.  CBG: No results for input(s): GLUCAP in the last 168 hours.     Signed:  Dia Crawford, MD [facility] [phone] pager

## 2020-06-11 NOTE — Social Work (Signed)
CSW spoke to pt bedside and daughter [place] by phone. CSW explained pt/ot recc of SNF. Pt declined and stated she will go home. Pt stated that her children will provide the 24 hour support she needs. Pt stated she has used [facility] in the past.   CSW informed NCM of [place] needs. CSW will sign off.  Emeterio Reeve, Latanya Presser, [place] Social Worker [phone]

## 2020-06-11 NOTE — TOC Transition Note (Signed)
Transition of Care [facility]) - CM/SW Discharge Note   Patient Details  Name: [name] MRN: [id number] Date of Birth: 1961-02-04  Transition of Care [facility]) CM/SW Contact:  Carles Collet, RN Phone Number: 06/11/2020, 11:58 AM   Clinical Narrative:    Damaris Schooner w patient. She confirms she wants to go home w [facility] services through [place]. Referral accepted by [facility]. She states she has RW at home would like to go home w 3/1.  Order placed and referral made to [employer] to have one delivered to her room prior tp DC. She states her daughter supports her at home and will be able to provide transport home.     Final next level of care: [place] Barriers to Discharge: No Barriers Identified   Patient Goals and CMS Choice Patient states their goals for this hospitalization and ongoing recovery are:: to go home CMS Medicare.gov Compare Post Acute Care list provided to:: Patient Choice offered to / list presented to : Patient  Discharge Placement                       Discharge Plan and Services                DME Arranged: 3-N-1 DME Agency: [facility] Date DME Agency Contacted: 06/11/20 Time DME Agency Contacted: [phone] Representative spoke with at DME Agency: [place] Sol HH Arranged: PT, OT [place] Agency: [place] Date [place]: 06/11/20 Time [place]: [phone] Representative spoke with at [place]: [place] ([place]) Interventions     Readmission Risk Interventions No flowsheet data found.

## 2020-06-12 MED ORDER — IRBESARTAN 75 MG PO TABS: 75.0000 mg | ORAL_TABLET | Freq: Every day | ORAL | 0 refills | Status: DC

## 2020-06-13 ENCOUNTER — Telehealth: Payer: Self-pay | Admitting: Internal Medicine

## 2020-06-13 ENCOUNTER — Telehealth: Payer: Self-pay | Admitting: *Deleted

## 2020-06-13 NOTE — Telephone Encounter (Signed)
Ok for verbals 

## 2020-06-13 NOTE — Telephone Encounter (Signed)
    Cindy requesting order for eval for home PT/OT. Patient discharged from hospital 6/27 Phone [phone]

## 2020-06-13 NOTE — Telephone Encounter (Signed)
Transition Care Management Follow-up Telephone Call   Date discharged? 06/12/20   How have you been since you were released from the hospital? Pt states she is doing ok. Still in a lot of pain w/her neck   Do you understand why you were in the hospital? YES, pt states she was so nervous and was in so much pain she ended up w/having an asthma attack   Do you understand the discharge instructions? YES   Where were you discharged to? Home    Items Reviewed:  Medications reviewed: YES, pt states there was no changes  Allergies reviewed: YES, no changes  Dietary changes reviewed: NO  Referrals reviewed: No referral needed   Functional Questionnaire:   Activities of Daily Living (ADLs):   She states she are independent in the following: ambulation, bathing and hygiene, feeding, continence, grooming, toileting and dressing States she doesn't require assistance    Any transportation issues/concerns?: NO   Any patient concerns? NO   Confirmed importance and date/time of follow-up visits scheduled YES, appt 06/16/20  Provider Appointment booked with Dr. Jenny Reichmann  Confirmed with patient if condition begins to worsen call PCP or go to the ER.  Patient was given the office number and encouraged to call back with question or concerns.  : YES

## 2020-06-14 NOTE — Telephone Encounter (Signed)
Left detailed message with verbal okay as requested.  

## 2020-06-16 ENCOUNTER — Ambulatory Visit: Payer: Self-pay

## 2020-06-16 ENCOUNTER — Inpatient Hospital Stay: Payer: Medicare HMO | Admitting: Internal Medicine

## 2020-06-17 ENCOUNTER — Encounter: Payer: Self-pay | Admitting: Internal Medicine

## 2020-06-17 ENCOUNTER — Other Ambulatory Visit: Payer: Self-pay

## 2020-06-17 ENCOUNTER — Ambulatory Visit (INDEPENDENT_AMBULATORY_CARE_PROVIDER_SITE_OTHER): Payer: Medicare HMO | Admitting: Internal Medicine

## 2020-06-17 VITALS — BP 120/80 | HR 64 | Temp 98.5°F | Ht 63.0 in | Wt 150.0 lb

## 2020-06-17 DIAGNOSIS — Z0001 Encounter for general adult medical examination with abnormal findings: Secondary | ICD-10-CM

## 2020-06-17 DIAGNOSIS — E538 Deficiency of other specified B group vitamins: Secondary | ICD-10-CM | POA: Diagnosis not present

## 2020-06-17 DIAGNOSIS — E611 Iron deficiency: Secondary | ICD-10-CM

## 2020-06-17 DIAGNOSIS — Z0000 Encounter for general adult medical examination without abnormal findings: Secondary | ICD-10-CM | POA: Diagnosis not present

## 2020-06-17 DIAGNOSIS — Z01818 Encounter for other preprocedural examination: Secondary | ICD-10-CM

## 2020-06-17 DIAGNOSIS — I952 Hypotension due to drugs: Secondary | ICD-10-CM | POA: Diagnosis not present

## 2020-06-17 DIAGNOSIS — E559 Vitamin D deficiency, unspecified: Secondary | ICD-10-CM

## 2020-06-17 DIAGNOSIS — J4522 Mild intermittent asthma with status asthmaticus: Secondary | ICD-10-CM

## 2020-06-17 DIAGNOSIS — R7303 Prediabetes: Secondary | ICD-10-CM

## 2020-06-17 DIAGNOSIS — M5412 Radiculopathy, cervical region: Secondary | ICD-10-CM | POA: Diagnosis not present

## 2020-06-17 LAB — LIPID PANEL
Cholesterol: 217 mg/dL — ABNORMAL HIGH (ref 0–200)
HDL: 82.7 mg/dL (ref >39.00)
LDL Cholesterol: 120 mg/dL — ABNORMAL HIGH (ref 0–99)
NonHDL: 134.73
Total CHOL/HDL Ratio: 3
Triglycerides: 74 mg/dL (ref 0.0–149.0)
VLDL: 14.8 mg/dL (ref 0.0–40.0)

## 2020-06-17 LAB — URINALYSIS, ROUTINE W REFLEX MICROSCOPIC
Bilirubin Urine: NEGATIVE
Ketones, ur: NEGATIVE
Leukocytes,Ua: NEGATIVE
Nitrite: NEGATIVE
RBC / HPF: NONE SEEN (ref 0–?)
Specific Gravity, Urine: 1.025 (ref 1.000–1.030)
Total Protein, Urine: NEGATIVE
Urine Glucose: NEGATIVE
Urobilinogen, UA: 0.2 (ref 0.0–1.0)
pH: 5.5 (ref 5.0–8.0)

## 2020-06-17 LAB — CBC WITH DIFFERENTIAL/PLATELET
Basophils Absolute: 0.1 10*3/uL (ref 0.0–0.1)
Basophils Relative: 0.4 % (ref 0.0–3.0)
Eosinophils Absolute: 0.3 10*3/uL (ref 0.0–0.7)
Eosinophils Relative: 2 % (ref 0.0–5.0)
HCT: 36.9 % (ref 36.0–46.0)
Hemoglobin: 11.9 g/dL — ABNORMAL LOW (ref 12.0–15.0)
Lymphocytes Relative: 26.6 % (ref 12.0–46.0)
Lymphs Abs: 4 10*3/uL (ref 0.7–4.0)
MCHC: 32.2 g/dL (ref 30.0–36.0)
MCV: 88.1 fl (ref 78.0–100.0)
Monocytes Absolute: 1.1 10*3/uL — ABNORMAL HIGH (ref 0.1–1.0)
Monocytes Relative: 7.3 % (ref 3.0–12.0)
Neutro Abs: 9.7 10*3/uL — ABNORMAL HIGH (ref 1.4–7.7)
Neutrophils Relative %: 63.7 % (ref 43.0–77.0)
Platelets: 387 10*3/uL (ref 150.0–400.0)
RBC: 4.19 Mil/uL (ref 3.87–5.11)
RDW: 16.7 % — ABNORMAL HIGH (ref 11.5–15.5)
WBC: 15.2 10*3/uL — ABNORMAL HIGH (ref 4.0–10.5)

## 2020-06-17 LAB — MICROALBUMIN / CREATININE URINE RATIO
Creatinine,U: 149.4 mg/dL
Microalb Creat Ratio: 0.5 mg/g (ref 0.0–30.0)
Microalb, Ur: <0.7 mg/dL (ref 0.0–1.9)

## 2020-06-17 LAB — IBC PANEL
Iron: 117 ug/dL (ref 42–145)
Saturation Ratios: 28.3 % (ref 20.0–50.0)
Transferrin: 295 mg/dL (ref 212.0–360.0)

## 2020-06-17 LAB — BASIC METABOLIC PANEL
BUN: 12 mg/dL (ref 6–23)
CO2: 31 mEq/L (ref 19–32)
Calcium: 9.5 mg/dL (ref 8.4–10.5)
Chloride: 100 mEq/L (ref 96–112)
Creatinine, Ser: 0.71 mg/dL (ref 0.40–1.20)
GFR: 102.49 mL/min (ref >60.00)
Glucose, Bld: 75 mg/dL (ref 70–99)
Potassium: 4.4 mEq/L (ref 3.5–5.1)
Sodium: 137 mEq/L (ref 135–145)

## 2020-06-17 LAB — HEPATIC FUNCTION PANEL
ALT: 56 U/L — ABNORMAL HIGH (ref 0–35)
AST: 26 U/L (ref 0–37)
Albumin: 4.2 g/dL (ref 3.5–5.2)
Alkaline Phosphatase: 79 U/L (ref 39–117)
Bilirubin, Direct: 0.1 mg/dL (ref 0.0–0.3)
Total Bilirubin: 0.3 mg/dL (ref 0.2–1.2)
Total Protein: 6.9 g/dL (ref 6.0–8.3)

## 2020-06-17 LAB — HEMOGLOBIN A1C: Hgb A1c MFr Bld: 6.2 % (ref 4.6–6.5)

## 2020-06-17 LAB — TSH: TSH: 0.91 u[IU]/mL (ref 0.35–4.50)

## 2020-06-17 LAB — VITAMIN D 25 HYDROXY (VIT D DEFICIENCY, FRACTURES): VITD: 44.95 ng/mL (ref 30.00–100.00)

## 2020-06-17 LAB — VITAMIN B12: Vitamin B-12: 489 pg/mL (ref 211–911)

## 2020-06-17 MED ORDER — CYCLOBENZAPRINE HCL 5 MG PO TABS
5.0000 mg | ORAL_TABLET | Freq: Three times a day (TID) | ORAL | 2 refills | Status: DC | PRN
Start: 2020-06-17 — End: 2020-09-07

## 2020-06-17 MED ORDER — ALBUTEROL SULFATE (2.5 MG/3ML) 0.083% IN NEBU: 2.5000 mg | INHALATION_SOLUTION | Freq: Four times a day (QID) | RESPIRATORY_TRACT | 5 refills | Status: DC | PRN

## 2020-06-17 NOTE — Progress Notes (Signed)
Subjective:    Patient ID: [name], female    DOB: [birth year], 59 y.o.   MRN: [id number]  HPI  Here for wellness and f/u;  Overall doing ok;  Pt denies Chest pain, worsening SOB, DOE, wheezing, orthopnea, PND, worsening LE edema, palpitations, dizziness or syncope.  Pt denies neurological change such as new headache, facial or extremity weakness.  Pt denies polydipsia, polyuria, or low sugar symptoms. Pt states overall good compliance with treatment and medications, good tolerability, and has been trying to follow appropriate diet.  Pt denies worsening depressive symptoms, suicidal ideation or panic. No fever, night sweats, wt loss, loss of appetite, or other constitutional symptoms.  Pt states good ability with ADL's, has low fall risk, home safety reviewed and adequate, no other significant changes in hearing or vision, and only occasionally active with exercise. Has plan for mammogram, eye exam and dental soon.  No new complaints. Needs note for ok for surgury to [facility] Dr Arnoldo Morale for planned c spine surgury.  Was recently hospd with asthma exac, now resolved, has appt with allergy next month.  BP continue to be held for low bp at last hospn.  Asks for muscle relaxer as this has helped in the past for her neck pain.  Past Medical History:  Diagnosis Date  . ALLERGIC RHINITIS 05/12/2009   Qualifier: Diagnosis of  By: Lenn Cal Deborra Medina), Wynona Canes    . Anemia, iron deficiency 12/22/2014  . Angina   . Anxiety   . Arthritis   . Asthma   . ASTHMA 05/12/2009   Severe AFL (Spirometry 05/2009: pre-BD FEV1 0.87L 34% pred, post-BD FEV1 1.11L 44% pred) Volumes hyperinflated Decreased DLCO that does not fully correct to normal range for alveolar volume.     Marland Kitchen COPD 08/24/2009   Qualifier: Diagnosis of  By: Burnett Kanaris    . COPD (chronic obstructive pulmonary disease) ([place])   . Depression   . Fibromyalgia 05/14/2014  . GERD (gastroesophageal reflux disease)   . History of kidney stones   .  Hyperlipidemia 04/20/2017  . HYPERTENSION 05/12/2009   Qualifier: Diagnosis of  By: Lenn Cal Deborra Medina), Wynona Canes    . Migraine   . Nephrolithiasis   . Peripheral vascular disease ([place])   . Prediabetes 02/23/2014  . Seizure ([place])   . Seizure ([place])   . Urticaria    Past Surgical History:  Procedure Laterality Date  . ABDOMINAL HYSTERECTOMY    . BACK SURGERY    . COLONOSCOPY  12/20/2011   Procedure: COLONOSCOPY;  Surgeon: Landry Dyke, MD;  Location: [facility];  Service: Endoscopy;  Laterality: N/A;  . COLONOSCOPY  03/05/2012   Procedure: COLONOSCOPY;  Surgeon: Landry Dyke, MD;  Location: [facility];  Service: Endoscopy;  Laterality: N/A;  . DIAGNOSTIC LAPAROSCOPY    . HEMORRHOID SURGERY    . INCISE AND DRAIN ABCESS    . KIDNEY STONE SURGERY    . NECK SURGERY     x 2 Dr Orinda Kenner  . SPINE SURGERY  2019  . TOE SURGERY    . TUBAL LIGATION      reports that she quit smoking about 16 months ago. Her smoking use included cigarettes. She has a 9.00 pack-year smoking history. She has never used smokeless tobacco. She reports previous alcohol use. She reports current drug use. Drug: Marijuana. family history includes Alcohol abuse in her brother and father; Anesthesia problems in her father; Asthma in her brother, mother, and sister; Brain cancer in her sister; COPD  in her mother; Cancer in her father; Diabetes in her brother, father, mother, sister, and sister; Heart disease in her brother, mother, and sister; Hypertension in her sister; Kidney disease in her father; Sleep apnea in her brother. Allergies  Allergen Reactions  . Codeine Nausea And Vomiting  . Sulfa Antibiotics Nausea And Vomiting  . Sulfonamide Derivatives Nausea And Vomiting    REACTION: n/v   Current Outpatient Medications on File Prior to Visit  Medication Sig Dispense Refill  . albuterol (PROAIR HFA) 108 (90 Base) MCG/ACT inhaler Inhale 4 puffs into the lungs every 4 (four) hours as needed for wheezing or shortness  of breath. (Patient taking differently: Inhale 2 puffs into the lungs daily. ) 18 g 2  . Aspirin-Caffeine (BC FAST PAIN RELIEF ARTHRITIS) 1000-65 MG PACK Take 1 Package by mouth daily as needed (Knee pain).    . Aspirin-Salicylamide-Caffeine (BC HEADACHE POWDER PO) Take 1 Package by mouth daily as needed (Headache).    . Biotin 1000 MCG tablet Take 1,000 mcg by mouth daily.    . Budeson-Glycopyrrol-Formoterol (BREZTRI AEROSPHERE) 160-9-4.8 MCG/ACT AERO Inhale 2 puffs into the lungs 2 (two) times daily. Use with spacer. (Patient taking differently: Inhale 2 puffs into the lungs daily. Use with spacer.) 10.7 g 5  . budesonide-formoterol (SYMBICORT) 160-4.5 MCG/ACT inhaler Inhale 2 puffs into the lungs daily.    . carbamazepine (TEGRETOL) 200 MG tablet Take 2 tablets (400 mg total) by mouth 2 (two) times daily. 120 tablet 12  . Cholecalciferol (VITAMIN D) 125 MCG (5000 UT) CAPS Take 5,000 Units by mouth daily.    . diclofenac Sodium (VOLTAREN) 1 % GEL Apply 4 g topically 4 (four) times daily. 100 g 0  . estradiol (ESTRACE) 0.5 MG tablet Take 1 tablet (0.5 mg total) by mouth daily. 90 tablet 4  . famotidine (PEPCID) 20 MG tablet Take 1 tablet (20 mg total) by mouth 2 (two) times daily. 60 tablet 5  . fluticasone (FLONASE) 50 MCG/ACT nasal spray Place 1 spray into both nostrils 2 (two) times daily. (Patient taking differently: Place 1 spray into both nostrils daily as needed for allergies. ) 16 g 5  . hydrOXYzine (ATARAX/VISTARIL) 50 MG tablet Take 1 tablet (50 mg total) by mouth at bedtime. 10 tablet 0  . lactulose, encephalopathy, (GENERLAC) 10 GM/15ML SOLN TAKE 45 TO 90 ML( 30-60 GM) BY MOUTH TWICE DAILY AS NEEDED (Patient taking differently: Take 30-60 g by mouth See admin instructions. TAKE 45 TO 90 ML( 30-60 GM) BY MOUTH TWICE DAILY AS NEEDED) 473 mL 0  . levocetirizine (XYZAL) 5 MG tablet Take 1 tablet (5 mg total) by mouth 2 (two) times daily. (Patient taking differently: Take 5 mg by mouth daily. )  60 tablet 5  . montelukast (SINGULAIR) 10 MG tablet Take 1 tablet (10 mg total) by mouth at bedtime. 30 tablet 6  . pantoprazole (PROTONIX) 40 MG tablet 1 by mouth once daily (Patient taking differently: Take 40 mg by mouth daily. ) 90 tablet 3  . Tiotropium Bromide Monohydrate (SPIRIVA RESPIMAT) 2.5 MCG/ACT AERS Inhale 2 puffs into the lungs daily. 4 g 12  . triamcinolone cream (KENALOG) 0.1 % APPLY EXTERNALLY TO THE AFFECTED AREA TWICE DAILY (Patient taking differently: Apply 1 application topically daily as needed (Rash). ) 30 g 0  . vitamin C (ASCORBIC ACID) 500 MG tablet Take 500 mg by mouth daily.     . vitamin E 180 MG (400 UNITS) capsule Take 400 Units by mouth daily.    Marland Kitchen  hydrochlorothiazide (MICROZIDE) 12.5 MG capsule Take 1 capsule (12.5 mg total) by mouth daily. (Patient not taking: Reported on 06/17/2020) 90 capsule 3  . irbesartan (AVAPRO) 75 MG tablet Take 1 tablet (75 mg total) by mouth daily. (Patient not taking: Reported on 06/17/2020) 30 tablet 0  . pravastatin (PRAVACHOL) 40 MG tablet TAKE 1 TABLET(40 MG) BY MOUTH DAILY (Patient not taking: Reported on 06/17/2020) 90 tablet 3   No current facility-administered medications on file prior to visit.   Review of Systems All otherwise neg per pt     Objective:   Physical Exam BP 120/80 (BP Location: Left Arm, Patient Position: Sitting, Cuff Size: Large)   Pulse 64   Temp 98.5 F (36.9 C) (Oral)   Ht 5\' 3"  (1.6 m)   Wt 150 lb (68 kg)   SpO2 98%   BMI 26.57 kg/m . VS noted,  Constitutional: Pt appears in NAD HENT: Head: NCAT.  Right Ear: External ear normal.  Left Ear: External ear normal.  Eyes: . Pupils are equal, round, and reactive to light. Conjunctivae and EOM are normal Nose: without d/c or deformity Neck: Neck supple. Gross normal ROM Cardiovascular: Normal rate and regular rhythm.   Pulmonary/Chest: Effort normal and breath sounds without rales or wheezing.  Abd:  Soft, NT, ND, + BS, no organomegaly Neurological:  Pt is alert. At baseline orientation, motor grossly intact Skin: Skin is warm. No rashes, other new lesions, no LE edema Psychiatric: Pt behavior is normal without agitation  All otherwise neg per pt Lab Results  Component Value Date   WBC 15.2 (H) 06/17/2020   HGB 11.9 (L) 06/17/2020   HCT 36.9 06/17/2020   PLT 387.0 06/17/2020   GLUCOSE 75 06/17/2020   CHOL 217 (H) 06/17/2020   TRIG 74.0 06/17/2020   HDL 82.70 06/17/2020   LDLCALC 120 (H) 06/17/2020   ALT 56 (H) 06/17/2020   AST 26 06/17/2020   NA 137 06/17/2020   K 4.4 06/17/2020   CL 100 06/17/2020   CREATININE 0.71 06/17/2020   BUN 12 06/17/2020   CO2 31 06/17/2020   TSH 0.91 06/17/2020   HGBA1C 6.2 06/17/2020   MICROALBUR <0.7 06/17/2020      Assessment & Plan:

## 2020-06-17 NOTE — Patient Instructions (Signed)

## 2020-06-18 ENCOUNTER — Encounter: Payer: Self-pay | Admitting: Internal Medicine

## 2020-06-19 ENCOUNTER — Encounter: Payer: Self-pay | Admitting: Internal Medicine

## 2020-06-19 DIAGNOSIS — Z01818 Encounter for other preprocedural examination: Secondary | ICD-10-CM | POA: Insufficient documentation

## 2020-06-19 NOTE — Assessment & Plan Note (Signed)
Ok for muscle relaxer prn,  to f/u any worsening symptoms or concerns 

## 2020-06-19 NOTE — Assessment & Plan Note (Signed)

## 2020-06-19 NOTE — Assessment & Plan Note (Signed)
stable overall by history and exam, recent data reviewed with pt, and pt to continue medical treatment as before,  to f/u any worsening symptoms or concerns  

## 2020-06-19 NOTE — Assessment & Plan Note (Signed)
bp remains adequately controlled, will continue to hold meds for now

## 2020-06-19 NOTE — Assessment & Plan Note (Signed)
Resolved and stable,  to f/u any worsening symptoms or concerns

## 2020-06-19 NOTE — Assessment & Plan Note (Addendum)
[place] for [employer] pending a1c which needs to be I believe 8 or less,  to f/u any worsening symptoms or concerns  I spent 31 minutes in addition to time for CPX wellness examination in preparing to see the patient by review of recent labs, imaging and procedures, obtaining and reviewing separately obtained history, communicating with the patient and family or caregiver, ordering medications, tests or procedures, and documenting clinical information in the EHR including the differential Dx, treatment, and any further evaluation and other management of preop int med exam, asthma, preDM, hypotension, cervical radiculopathy,

## 2020-06-28 ENCOUNTER — Ambulatory Visit: Payer: Self-pay

## 2020-06-28 ENCOUNTER — Ambulatory Visit (INDEPENDENT_AMBULATORY_CARE_PROVIDER_SITE_OTHER): Payer: Medicare HMO | Admitting: Allergy & Immunology

## 2020-06-28 ENCOUNTER — Encounter: Payer: Self-pay | Admitting: Allergy & Immunology

## 2020-06-28 ENCOUNTER — Other Ambulatory Visit: Payer: Self-pay

## 2020-06-28 VITALS — BP 118/72 | HR 80 | Temp 98.3°F | Resp 17

## 2020-06-28 DIAGNOSIS — J449 Chronic obstructive pulmonary disease, unspecified: Secondary | ICD-10-CM

## 2020-06-28 DIAGNOSIS — J3089 Other allergic rhinitis: Secondary | ICD-10-CM

## 2020-06-28 DIAGNOSIS — J4540 Moderate persistent asthma, uncomplicated: Secondary | ICD-10-CM | POA: Diagnosis not present

## 2020-06-28 DIAGNOSIS — L508 Other urticaria: Secondary | ICD-10-CM | POA: Diagnosis not present

## 2020-06-28 MED ORDER — DUPILUMAB 300 MG/2ML ~~LOC~~ SOSY
300.0000 mg | PREFILLED_SYRINGE | SUBCUTANEOUS | Status: DC
  Administered 2020-06-28 – 2021-05-02 (×21): 300 mg via SUBCUTANEOUS

## 2020-06-28 MED ORDER — BREZTRI AEROSPHERE 160-9-4.8 MCG/ACT IN AERO: 2.0000 | INHALATION_SPRAY | Freq: Two times a day (BID) | RESPIRATORY_TRACT | 5 refills | Status: DC

## 2020-06-28 MED ORDER — DOXEPIN HCL 25 MG PO CAPS: 25.0000 mg | ORAL_CAPSULE | Freq: Every evening | ORAL | 5 refills | Status: DC | PRN

## 2020-06-28 NOTE — Progress Notes (Signed)
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 chest CT next visit)  Perennial allergic rhinitis (dust mites)  Chronic urticaria    Plan/Recommendations:   1. Asthma-COPD overlap syndrome - Lung testing looked fairly stable today. - Stop your Dulera (BLUE) and start Breztri two puffs twice daily (sample provided).  - Daily controller medication(s): Breztri 163mcg two puffs twice daily with spacer + Dupixent 300 mg every two weeks - Prior to physical activity: Proventil (ORANAGE INHALER) 2 puffs or Ventolin (GRAY INHALER) 2 puffs 10-15 minutes before physical activity. - Rescue medications: Proventil (ORANAGE INHALER) 2 puffs or Ventolin (GRAY INHALER) 2 puffs AND 2 puffs Atrovent (GREEN INHALER) every 4-6 hours as needed - Asthma control goals:  * Full participation in all desired activities (may need albuterol before activity) * Albuterol use two time or less a week on average (not counting use with activity) * Cough interfering with sleep two time or less a month * Oral steroids no more than once a year * No hospitalizations   2. Chronic urticaria - Continue with Xyzal twice daily. - Continue with famotidine twice daily. - Add on Doxepin 25mg  at night to help with itching.   3. 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 - Consider allergy shots for long term control.   4. Return in about 4 weeks (around 07/26/2020). This can be an in-person, a virtual Webex or a telephone follow up visit.   Subjective:   [name] is a 59 y.o. female presenting today for follow up of  Chief Complaint  Patient presents with  . Pruritis    itching all over     [name] has a history of the following: Patient Active Problem List   Diagnosis Date Noted  . Preop exam for internal medicine 06/19/2020  .  Hypotension due to medication 06/11/2020  . Status asthmaticus 06/09/2020  . Leg cramping 08/12/2019  . Hypophosphatemia 01/07/2019  . Vertigo 09/23/2018  . Dysfunction of left eustachian tube 09/23/2018  . Gait disorder 04/21/2018  . Leukocytosis 04/15/2018  . Nausea & vomiting 04/15/2018  . Seizure ([place])   . Cervical radiculopathy 02/18/2018  . 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 Syncope 09/04/2017  . Constipation 09/04/2017  . Nonintractable headache 09/04/2017  . Leg swelling 07/26/2017  . 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 Hyperlipidemia 04/20/2017  . Pedal edema 04/19/2017  . Bipolar I disorder ([place]) 09/19/2016  . Facial pain 07/12/2016  . Rash 06/14/2016  . Migraine 01/25/2016  . Idiopathic urticaria/pruritus 01/23/2016  . Perennial allergic rhinitis 01/23/2016  . Oral candidiasis 01/23/2016  . Greater trochanteric bursitis of both hips 09/08/2015  . Chest pain 06/22/2015  . Itching 06/22/2015  . Bilateral knee pain 06/22/2015  . Bilateral hip pain 06/22/2015  . Anemia, iron deficiency 12/22/2014  . Fatigue 12/21/2014  . Intertrigo 08/07/2014  . Dyspareunia 07/12/2014  . Menopausal syndrome (hot flushes) 07/12/2014  . History of tobacco abuse 07/12/2014  . Dizziness 06/22/2014  . Weakness 06/22/2014  . Fibromyalgia 05/14/2014  . Encounter for well adult exam with abnormal findings 04/22/2014  . Recurrent boils 04/22/2014  . Recurrent falls 04/22/2014  . Peripheral vascular disease ([place])   . Depression   . Anxiety   . GERD (gastroesophageal reflux disease)   . Prediabetes 02/23/2014  . Screening mammogram for high-risk patient 02/23/2014  .  Back pain 07/22/2013  . COPD/asthma 08/24/2009  . Headache(784.0) 08/24/2009  . HTN (hypertension) 05/12/2009    History obtained from: chart review and patient.  [name] is a 59 y.o. female presenting for a follow up visit.  She was last seen in May 2021.  At that time, her breathing tests looked stable.   We avoided steroids and started her on Breztri 2 puffs twice daily and Dupixent 300 mg every 2 weeks.  She has a history of chronic urticaria and would continue with Xyzal twice daily and famotidine twice daily.  We continue with her Flonase for her allergic rhinitis and obtain blood work which was negative to the environmental allergy panel as well as the extended mold panel.  An alpha gal panel was negative as well.  She moved into a new apartment with fresh paint which triggered her breathing issues. She ended up going into the hospital. She was around smoke from her friends as well.   Asthma/Respiratory Symptom History: She is unsure what she is taking. I showed her a picture of Breztri but she does not recognize it. She does have Dulera in her purse as well as Proventil and Ventolin and Atrovent.  She was hospitalized in the end of June after being seen earlier in June for COPD exacerbation.  She was put on steroids.  She has been compliant with her Dupixent.  She receives that here in our office.  She is followed by Dr. Lamonte Sakai and her last appointment was in April 2021. At that time, she had a televisit at which time she was started on prednisone. She was continued on Symbicort 80/4.39mcg two puffs BID.   Allergic Rhinitis Symptom History: She remains on the Xyzal twice daily as well as famotidine twice daily, mostly for her hives.  She does not use any kind and no spray at all, although she does have Flonase.  Her last testing was done when she saw Dr. Verlin Fester and was positive to dust mites on prick testing. She had testing that was positive to dust mites in 2017.  Otherwise, there have been no changes to her past medical history, surgical history, family history, or social history.    Review of Systems  Constitutional: Negative.  Negative for chills, fever, malaise/fatigue and weight loss.  HENT: Negative.  Negative for congestion, ear discharge, ear pain and sore throat.   Eyes: Negative for  pain, discharge and redness.  Respiratory: Negative for cough, sputum production, shortness of breath and wheezing.   Cardiovascular: Negative.  Negative for chest pain and palpitations.  Gastrointestinal: Negative for abdominal pain, constipation, diarrhea, heartburn, nausea and vomiting.  Skin: Negative.  Negative for itching and rash.  Neurological: Negative for dizziness and headaches.  Endo/Heme/Allergies: Negative for environmental allergies. Does not bruise/bleed easily.       Objective:   Blood pressure 118/72, pulse 80, temperature 98.3 F (36.8 C), temperature source Temporal, resp. rate 17, SpO2 98 %. There is no height or weight on file to calculate BMI.   Physical Exam:  Physical Exam Constitutional:      Appearance: She is well-developed.  HENT:     Head: Normocephalic and atraumatic.     Right Ear: Tympanic membrane, ear canal and external ear normal.     Left Ear: Tympanic membrane, ear canal and external ear normal.     Nose: No nasal deformity, septal deviation, mucosal edema or rhinorrhea.     Right Turbinates: Enlarged and swollen. Not pale.  Left Turbinates: Enlarged and swollen. Not pale.     Right Sinus: No maxillary sinus tenderness or frontal sinus tenderness.     Left Sinus: No maxillary sinus tenderness or frontal sinus tenderness.     Comments: There is some mild cobblestoning in the posterior oropharynx.    Mouth/Throat:     Mouth: Mucous membranes are not pale and not dry.     Pharynx: Uvula midline.  Eyes:     General:        Right eye: No discharge.        Left eye: No discharge.     Conjunctiva/sclera: Conjunctivae normal.     Right eye: Right conjunctiva is not injected. No chemosis.    Left eye: Left conjunctiva is not injected. No chemosis.    Pupils: Pupils are equal, round, and reactive to light.  Cardiovascular:     Rate and Rhythm: Normal rate and regular rhythm.     Heart sounds: Normal heart sounds.  Pulmonary:     Effort:  Pulmonary effort is normal. No tachypnea, accessory muscle usage or respiratory distress.     Breath sounds: Normal breath sounds. No wheezing, rhonchi or rales.     Comments: Moving air well in all lung fields. No increased work of breathing.  Chest:     Chest wall: No tenderness.  Lymphadenopathy:     Cervical: No cervical adenopathy.  Skin:    Coloration: Skin is not pale.     Findings: No abrasion, erythema, petechiae or rash. Rash is not papular, urticarial or vesicular.     Comments: No rashes noted. No eczematous lesions noted.   Neurological:     Mental Status: She is alert.      Diagnostic studies:    Spirometry: results abnormal (FEV1: 0.64/31%, FVC: 1.51/58%, FEV1/FVC: 42%).    Spirometry consistent with mixed obstructive and restrictive disease.   Allergy Studies: none       Salvatore Marvel, MD  [facility]

## 2020-06-28 NOTE — Patient Instructions (Addendum)
1. Asthma-COPD overlap syndrome - Lung testing looked fairly stable today. - Stop your Dulera (BLUE) and start Breztri two puffs twice daily (sample provided).  - Daily controller medication(s): Breztri 140mcg two puffs twice daily with spacer + Dupixent 300 mg every two weeks - Prior to physical activity: Proventil (ORANAGE INHALER) 2 puffs or Ventolin (GRAY INHALER) 2 puffs 10-15 minutes before physical activity. - Rescue medications: Proventil (ORANAGE INHALER) 2 puffs or Ventolin (GRAY INHALER) 2 puffs AND 2 puffs Atrovent (GREEN INHALER) every 4-6 hours as needed - Asthma control goals:  * Full participation in all desired activities (may need albuterol before activity) * Albuterol use two time or less a week on average (not counting use with activity) * Cough interfering with sleep two time or less a month * Oral steroids no more than once a year * No hospitalizations   2. Chronic urticaria - Continue with Xyzal twice daily. - Continue with famotidine twice daily. - Add on Doxepin 25mg  at night to help with itching.   3. 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 - Consider allergy shots for long term control.   4. Return in about 4 weeks (around 07/26/2020). This can be an in-person, a virtual Webex or a telephone follow up visit.   Please inform us of any Emergency Department visits, hospitalizations, or changes in symptoms. Call us before going to the ED for breathing or allergy symptoms since we might be able to fit you in for a sick visit. Feel free to contact us anytime with any questions, problems, or concerns.  It was a pleasure to see you again today!  Websites that have reliable patient information: 1. American Academy of Asthma, Allergy, and Immunology: www.aaaai.org 2. Food Allergy Research and Education (FARE): foodallergy.org 3. Mothers of Asthmatics: http://www.asthmacommunitynetwork.org 4. American College  of Allergy, Asthma, and Immunology: www.acaai.org   COVID-19 Vaccine Information can be found at: ShippingScam.co.uk For questions related to vaccine distribution or appointments, please email [email or web address] or call [phone].     "Like" [place] on Facebook and Instagram for our latest updates!       HAPPY SPRING!  Make sure you are registered to vote! If you have moved or changed any of your contact information, you will need to get this updated before voting!  In some cases, you MAY be able to register to vote online: [email or web address]    Control of Dust Mite Allergen    Dust mites play a major role in allergic asthma and rhinitis.  They occur in environments with high humidity wherever human skin is found.  Dust mites absorb humidity from the atmosphere (ie, they do not drink) and feed on organic matter (including shed human and animal skin).  Dust mites are a microscopic type of insect that you cannot see with the naked eye.  High levels of dust mites have been detected from mattresses, pillows, carpets, upholstered furniture, bed covers, clothes, soft toys and any woven material.  The principal allergen of the dust mite is found in its feces.  A gram of dust may contain 1,000 mites and 250,000 fecal particles.  Mite antigen is easily measured in the air during house cleaning activities.  Dust mites do not bite and do not cause harm to humans, other than by triggering allergies/asthma.    Ways to decrease your exposure to dust mites in your home:  1. Encase mattresses, box springs and pillows with a mite-impermeable barrier or  cover   2. Wash sheets, blankets and drapes weekly in hot water (130 F) with detergent and dry them in a dryer on the hot setting.  3. Have the room cleaned frequently with a vacuum cleaner and a damp dust-mop.  For carpeting or rugs, vacuuming with a vacuum cleaner  equipped with a high-efficiency particulate air (HEPA) filter.  The dust mite allergic individual should not be in a room which is being cleaned and should wait 1 hour after cleaning before going into the room. 4. Do not sleep on upholstered furniture (eg, couches).   5. If possible removing carpeting, upholstered furniture and drapery from the home is ideal.  Horizontal blinds should be eliminated in the rooms where the person spends the most time (bedroom, study, television room).  Washable vinyl, roller-type shades are optimal. 6. Remove all non-washable stuffed toys from the bedroom.  Wash stuffed toys weekly like sheets and blankets above.   7. Reduce indoor humidity to less than 50%.  Inexpensive humidity monitors can be purchased at most hardware stores.  Do not use a humidifier as can make the problem worse and are not recommended.    Allergy Shots   Allergies are the result of a chain reaction that starts in the immune system. Your immune system controls how your body defends itself. For instance, if you have an allergy to pollen, your immune system identifies pollen as an invader or allergen. Your immune system overreacts by producing antibodies called Immunoglobulin E (IgE). These antibodies travel to cells that release chemicals, causing an allergic reaction.  The concept behind allergy immunotherapy, whether it is received in the form of shots or tablets, is that the immune system can be desensitized to specific allergens that trigger allergy symptoms. Although it requires time and patience, the payback can be long-term relief.  How Do Allergy Shots Work?  Allergy shots work much like a vaccine. Your body responds to injected amounts of a particular allergen given in increasing doses, eventually developing a resistance and tolerance to it. Allergy shots can lead to decreased, minimal or no allergy symptoms.  There generally are two phases: build-up and maintenance. Build-up often  ranges from three to six months and involves receiving injections with increasing amounts of the allergens. The shots are typically given once or twice a week, though more rapid build-up schedules are sometimes used.  The maintenance phase begins when the most effective dose is reached. This dose is different for each person, depending on how allergic you are and your response to the build-up injections. Once the maintenance dose is reached, there are longer periods between injections, typically two to four weeks.  Occasionally doctors give cortisone-type shots that can temporarily reduce allergy symptoms. These types of shots are different and should not be confused with allergy immunotherapy shots.  Who Can Be Treated with Allergy Shots?  Allergy shots may be a good treatment approach for people with allergic rhinitis (hay fever), allergic asthma, conjunctivitis (eye allergy) or stinging insect allergy.   Before deciding to begin allergy shots, you should consider:  . The length of allergy season and the severity of your symptoms . Whether medications and/or changes to your environment can control your symptoms . Your desire to avoid long-term medication use . Time: allergy immunotherapy requires a major time commitment . Cost: may vary depending on your insurance coverage  Allergy shots for children age 49 and older are effective and often well tolerated. They might prevent the onset of new allergen  sensitivities or the progression to asthma.  Allergy shots are not started on patients who are pregnant but can be continued on patients who become pregnant while receiving them. In some patients with other medical conditions or who take certain common medications, allergy shots may be of risk. It is important to mention other medications you talk to your allergist.   When Will I Feel Better?  Some may experience decreased allergy symptoms during the build-up phase. For others, it may take as  long as 12 months on the maintenance dose. If there is no improvement after a year of maintenance, your allergist will discuss other treatment options with you.  If you aren't responding to allergy shots, it may be because there is not enough dose of the allergen in your vaccine or there are missing allergens that were not identified during your allergy testing. Other reasons could be that there are high levels of the allergen in your environment or major exposure to non-allergic triggers like tobacco smoke.  What Is the Length of Treatment?  Once the maintenance dose is reached, allergy shots are generally continued for three to five years. The decision to stop should be discussed with your allergist at that time. Some people may experience a permanent reduction of allergy symptoms. Others may relapse and a longer course of allergy shots can be considered.  What Are the Possible Reactions?  The two types of adverse reactions that can occur with allergy shots are local and systemic. Common local reactions include very mild redness and swelling at the injection site, which can happen immediately or several hours after. A systemic reaction, which is less common, affects the entire body or a particular body system. They are usually mild and typically respond quickly to medications. Signs include increased allergy symptoms such as sneezing, a stuffy nose or hives.  Rarely, a serious systemic reaction called anaphylaxis can develop. Symptoms include swelling in the throat, wheezing, a feeling of tightness in the chest, nausea or dizziness. Most serious systemic reactions develop within 30 minutes of allergy shots. This is why it is strongly recommended you wait in your doctor's office for 30 minutes after your injections. Your allergist is trained to watch for reactions, and his or her staff is trained and equipped with the proper medications to identify and treat them.  Who Should Administer Allergy  Shots?  The preferred location for receiving shots is your prescribing allergist's office. Injections can sometimes be given at another facility where the physician and staff are trained to recognize and treat reactions, and have received instructions by your prescribing allergist.

## 2020-06-29 ENCOUNTER — Telehealth: Payer: Self-pay

## 2020-06-29 NOTE — Telephone Encounter (Signed)
New message    Per patient voiced Dr. Newman Pies is preparing for surgery, request was sent over a request x 3 with no response for cardiac clearance    Fax # [phone]   Attention Lexine Baton

## 2020-06-29 NOTE — Telephone Encounter (Signed)
Sorry, I dont recall any form or signing it

## 2020-06-30 NOTE — Telephone Encounter (Signed)
   Patient calling for status of clearance forms from [facility] (phone [phone])

## 2020-07-05 ENCOUNTER — Other Ambulatory Visit: Payer: Self-pay | Admitting: Neurosurgery

## 2020-07-05 NOTE — Telephone Encounter (Signed)
The letter was sent for clearance on 06/21/2020 @ 10:52am.  As far as any forms we have not gotten any.

## 2020-07-12 ENCOUNTER — Ambulatory Visit: Payer: Self-pay

## 2020-07-18 ENCOUNTER — Other Ambulatory Visit: Payer: Self-pay

## 2020-07-18 ENCOUNTER — Ambulatory Visit (INDEPENDENT_AMBULATORY_CARE_PROVIDER_SITE_OTHER): Payer: Medicare HMO

## 2020-07-18 DIAGNOSIS — J4540 Moderate persistent asthma, uncomplicated: Secondary | ICD-10-CM

## 2020-07-18 NOTE — Pre-Procedure Instructions (Signed)
[id number] [address], [place] [address] Phone: [phone] Fax: [phone]  [facility] [place], [address] [place] [address] Phone: [phone] Fax: [place], [place] - [address] [place] [address] Phone: [phone] Fax: [phone]    Your procedure is scheduled on Thurs., Aug. 12, 2021 from 10:30AM-1:24PM  Report to [facility] Entr[facility] "A" at 8:30AM  Call this number if you have problems the morning of surgery:  [phone]   Remember:  Do not eat or drink after midnight on Aug. 11th    Take these medicines the morning of surgery with A SIP OF WATER:     Carbamazepine (TEGRETOL) Estradiol (ESTRACE)     Famotidine (PEPCID)  Levocetirizine (XYZAL) Pantoprazole (PROTONIX) Budeson-Glycopyrrol-Formoterol (BREZTRI AEROSPHERE)- bring with you day of surgery Budesonide-formoterol (SYMBICORT)- bring with you day of surgery Tiotropium Bromide Monohydrate (SPIRIVA RESPIMAT)- bring with you day of surgery  If Needed: Fluticasone (FLONASE) Albuterol Inhaler- bring with you day of surgery  7 days before surgery (07/21/20), stop taking all Aspirin (unless instructed by your doctor) and Other Aspirin containing products, Vitamins, Fish oils, and Herbal medications. Also stop all NSAIDS i.e. Advil, Ibuprofen, Motrin, Aleve, Anaprox, Naproxen, BC, Goody Powders, and all Supplements.  No Smoking of any kind, Tobacco, or Alcohol products 24 hours prior to your procedure. If you use a Cpap at night, you may bring all equipment for your overnight stay.  Special instructions: [facility]- Preparing For Surgery  Before surgery, you can play an important role. Because skin is not sterile, your skin needs to be as free of  germs as possible. You can reduce the number of germs on your skin by washing with CHG (chlorahexidine gluconate) Soap before surgery.  CHG is an antiseptic cleaner which kills germs and bonds with the skin to continue killing germs even after washing.    Please do not use if you have an allergy to CHG or antibacterial soaps. If your skin becomes reddened/irritated stop using the CHG.  Do not shave (including legs and underarms) for at least 48 hours prior to first CHG shower. It is OK to shave your face.  Please follow these instructions carefully.   1. Shower the NIGHT BEFORE SURGERY and the MORNING OF SURGERY with CHG.   2. If you chose to wash your hair, wash your hair first as usual with your normal shampoo.  3. After you shampoo, rinse your hair and body thoroughly to remove the shampoo.  4. Use CHG as you would any other liquid soap. You can apply CHG directly to the skin and wash gently with a scrungie or a clean washcloth.   5. Apply the CHG Soap to your body ONLY FROM THE NECK DOWN.  Do not use on open wounds or open sores. Avoid contact with your eyes, ears, mouth and genitals (private parts). Wash Face and genitals (private parts)  with your normal soap.  6. Wash thoroughly, paying special attention to the area where your surgery will be performed.  7. Thoroughly rinse your body with warm water from the neck down.  8. DO NOT shower/wash with your normal soap after using and rinsing off the CHG Soap.  9. Pat yourself dry with a CLEAN TOWEL.  10. Wear CLEAN PAJAMAS to  bed the night before surgery, wear comfortable clothes the morning of surgery  11. Place CLEAN SHEETS on your bed the night of your first shower and DO NOT SLEEP WITH PETS.   Day of Surgery:          Remember to brush your teeth WITH YOUR REGULAR TOOTHPASTE.  Do not wear jewelry, make-up or nail polish.  Do not wear lotions, powders, or perfumes, or deodorant.  Do not shave 48 hours prior to surgery.    Do  not bring valuables to the hospital.  [facility] is not responsible for any belongings or valuables.  Contacts, dentures or bridgework may not be worn into surgery.    For patients admitted to the hospital, discharge time will be determined by your treatment team.  Patients discharged the day of surgery will not be allowed to drive home, and someone age 74 and over needs to stay with them for 24 hours.  Please wear clean clothes to the hospital/surgery center.    Please read over the following fact sheets that you were given.

## 2020-07-19 ENCOUNTER — Other Ambulatory Visit: Payer: Self-pay | Admitting: Neurosurgery

## 2020-07-19 ENCOUNTER — Encounter (HOSPITAL_COMMUNITY): Payer: Self-pay

## 2020-07-19 ENCOUNTER — Encounter (HOSPITAL_COMMUNITY)
Admission: RE | Admit: 2020-07-19 | Discharge: 2020-07-19 | Disposition: A | Discharge status: Home / Self Care | Payer: Medicare HMO | Source: Ambulatory Visit | Attending: Neurosurgery | Admitting: Neurosurgery

## 2020-07-19 ENCOUNTER — Other Ambulatory Visit: Payer: Self-pay

## 2020-07-19 DIAGNOSIS — Z01812 Encounter for preprocedural laboratory examination: Secondary | ICD-10-CM | POA: Insufficient documentation

## 2020-07-19 HISTORY — DX: Bipolar disorder, unspecified: F31.9

## 2020-07-19 HISTORY — DX: Pneumonia, unspecified organism: J18.9

## 2020-07-19 HISTORY — DX: Angina pectoris, unspecified: I20.9

## 2020-07-19 HISTORY — DX: Dyspnea, unspecified: R06.00

## 2020-07-19 HISTORY — DX: Headache, unspecified: R51.9

## 2020-07-19 LAB — SURGICAL PCR SCREEN
MRSA, PCR: NEGATIVE
Staphylococcus aureus: NEGATIVE

## 2020-07-19 LAB — CBC
HCT: 35.7 % — ABNORMAL LOW (ref 36.0–46.0)
Hemoglobin: 11 g/dL — ABNORMAL LOW (ref 12.0–15.0)
MCH: 27.5 pg (ref 26.0–34.0)
MCHC: 30.8 g/dL (ref 30.0–36.0)
MCV: 89.3 fL (ref 80.0–100.0)
Platelets: 411 10*3/uL — ABNORMAL HIGH (ref 150–400)
RBC: 4 MIL/uL (ref 3.87–5.11)
RDW: 16.1 % — ABNORMAL HIGH (ref 11.5–15.5)
WBC: 10.9 10*3/uL — ABNORMAL HIGH (ref 4.0–10.5)
nRBC: 0 % (ref 0.0–0.2)

## 2020-07-19 LAB — BASIC METABOLIC PANEL
Anion gap: 9 (ref 5–15)
BUN: 11 mg/dL (ref 6–20)
CO2: 27 mmol/L (ref 22–32)
Calcium: 8.9 mg/dL (ref 8.9–10.3)
Chloride: 102 mmol/L (ref 98–111)
Creatinine, Ser: 0.68 mg/dL (ref 0.44–1.00)
GFR calc Af Amer: >60 mL/min (ref >60)
GFR calc non Af Amer: >60 mL/min (ref >60)
Glucose, Bld: 81 mg/dL (ref 70–99)
Potassium: 4.4 mmol/L (ref 3.5–5.1)
Sodium: 138 mmol/L (ref 135–145)

## 2020-07-19 LAB — TYPE AND SCREEN
ABO/RH(D): O POS
Antibody Screen: NEGATIVE

## 2020-07-19 NOTE — Progress Notes (Signed)
PCP - Cathlean Cower Cardiologist - na    Chest x-ray - 06/03/20 EKG - 06/14/20 Stress Test -  na ECHO - 06/10/20 Cardiac Cath - na  Sleep Study - na   Fasting Blood Sugar - na  Blood Thinner Instructions: na Aspirin Instructions:na   COVID TEST- 07/25/20   Anesthesia review: recent hospitalization 6/64/21 related to asthma  Patient denies shortness of breath, fever, cough and chest pain at PAT appointment   All instructions explained to the patient, with a verbal understanding of the material. Patient agrees to go over the instructions while at home for a better understanding. Patient also instructed to self quarantine after being tested for COVID-19. The opportunity to ask questions was provided.

## 2020-07-20 ENCOUNTER — Telehealth: Payer: Self-pay | Admitting: Emergency Medicine

## 2020-07-20 MED ORDER — PREDNISONE 10 MG PO TABS
ORAL_TABLET | ORAL | 0 refills | Status: DC
Start: 2020-07-20 — End: 2020-07-29

## 2020-07-20 NOTE — Anesthesia Preprocedure Evaluation (Addendum)
Anesthesia Evaluation  Patient identified by MRN, date of birth, ID band Patient awake    Reviewed: Allergy & Precautions, NPO status , Patient's Chart, lab work & pertinent test results  Airway Mallampati: II  TM Distance: >3 FB Neck ROM: Full    Dental  (+) Teeth Intact, Dental Advisory Given,    Pulmonary former smoker,   Distant BS. No wheezing   + decreased breath sounds      Cardiovascular hypertension,  Rhythm:Regular Rate:Normal     Neuro/Psych    GI/Hepatic   Endo/Other    Renal/GU      Musculoskeletal   Abdominal   Peds  Hematology   Anesthesia Other Findings   Reproductive/Obstetrics                            Anesthesia Physical Anesthesia Plan  ASA: III  Anesthesia Plan: General   Post-op Pain Management:    Induction: Intravenous  PONV Risk Score and Plan: Ondansetron and Dexamethasone  Airway Management Planned: Oral ETT  Additional Equipment:   Intra-op Plan:   Post-operative Plan: Extubation in OR  Informed Consent: I have reviewed the patients History and Physical, chart, labs and discussed the procedure including the risks, benefits and alternatives for the proposed anesthesia with the patient or authorized representative who has indicated his/her understanding and acceptance.     Dental advisory given  Plan Discussed with: CRNA and Anesthesiologist  Anesthesia Plan Comments: (PAT note by Karoline Caldwell, PA-C: Patient recently admitted 6/24-6/27/21 for asthma/COPD exacerbation.  Post discharge she followed up with her PCP Dr. Jenny Reichmann to discuss surgery.  Per note 06/17/2020 she was doing well from a breathing standpoint and cleared for surgery.  She also followed up with asthma and allergy specialist Dr. Ernst Bowler. Spirometry was stable.  Her controller medications are Breztri twice daily and Dupixent every 2 weeks.  Uses albuterol as needed.  On Xyzal and doxepin  for chronic urticaria.  At PAT appointment patient reported her breathing is stable, at baseline.  Preop labs reviewed, unremarkable.  EKG 06/10/2020: NSR with sinus arrhythmia.  Rate 66.  PORTABLE CHEST 1 VIEW 06/09/2020:  COMPARISON:  06/03/2020  FINDINGS: The heart size and mediastinal contours are within normal limits. Both lungs are clear. The visualized skeletal structures are unremarkable.  IMPRESSION: No active disease.  Spirometry 06/28/2020: Spirometry: results abnormal (FEV1: 0.64/31%, FVC: 1.51/58%, FEV1/FVC: 42%).  Spirometry consistent with mixed obstructive and restrictive disease.   TTE 06/10/2020: 1. Left ventricular ejection fraction, by estimation, is 60 to 65%. The  left ventricle has normal function. The left ventricle has no regional  wall motion abnormalities. Left ventricular diastolic parameters were  normal.  2. Right ventricular systolic function is normal. The right ventricular  size is normal. Tricuspid regurgitation signal is inadequate for assessing  PA pressure.  3. The mitral valve is normal in structure. Trivial mitral valve  regurgitation. No evidence of mitral stenosis.  4. Aortic valve appears grossly normal. Unable to determine valve  morphology due to image quality. Aortic valve regurgitation is not  visualized. No aortic stenosis is present.  5. The inferior vena cava is normal in size with greater than 50%  respiratory variability, suggesting right atrial pressure of 3 mmHg.  )       Anesthesia Quick Evaluation

## 2020-07-20 NOTE — Telephone Encounter (Signed)
Called and spoke with patient she states that they came and cut her grass yesterday and she was around it and since then has been having shortness of breath, cough with some mucus production and sinus headache and wheezing. She did a neb treatment this morning at 8:30 and has not noticed much difference. Has used her rescue inhaler, Breztri and Symbicort.  Please advise

## 2020-07-20 NOTE — Telephone Encounter (Signed)
Well she should not be using both Breztri and Symbicort. Look like [name] was started by Dr. Ernst Bowler, stop Symbicort. Is she still taking Dupixent- when was her last injection? Please sen in prednisone 40mg  x 2 days; 30mg  x 2 days; 20mg  x 2 days; 10mg  x 2 days for acute exacerbation

## 2020-07-20 NOTE — Addendum Note (Signed)
Addended by: Valentina Shaggy on: 07/20/2020 10:27 AM   Modules accepted: Orders

## 2020-07-20 NOTE — Telephone Encounter (Signed)
Thanks

## 2020-07-20 NOTE — Telephone Encounter (Signed)
I called and spoke with the patient and provided instructions per [facility].  She was educated not to use Symbicort and Breztri at the same time.  Advised to stop Symbicort.  She verified that her last Dupixent injection was 07/19/2020 at Dr. Gillermina Hu office.  Verified pharmacy and Prednisone sent to pharmacy, pharmacy verified with patient.  Nothing further needed.

## 2020-07-20 NOTE — Progress Notes (Signed)
Anesthesia Chart Review:  Patient recently admitted 6/24-6/27/21 for asthma/COPD exacerbation.  Post discharge she followed up with her PCP Dr. Jenny Reichmann to discuss surgery.  Per note 06/17/2020 she was doing well from a breathing standpoint and cleared for surgery.  She also followed up with asthma and allergy specialist Dr. Ernst Bowler. Spirometry was stable.  Her controller medications are Breztri twice daily and Dupixent every 2 weeks.  Uses albuterol as needed.  On Xyzal and doxepin for chronic urticaria.  At PAT appointment patient reported her breathing is stable, at baseline.  Preop labs reviewed, unremarkable.  EKG 06/10/2020: NSR with sinus arrhythmia.  Rate 66.  PORTABLE CHEST 1 VIEW 06/09/2020:  COMPARISON:  06/03/2020  FINDINGS: The heart size and mediastinal contours are within normal limits. Both lungs are clear. The visualized skeletal structures are unremarkable.  IMPRESSION: No active disease.  Spirometry 06/28/2020: Spirometry: results abnormal (FEV1: 0.64/31%, FVC: 1.51/58%, FEV1/FVC: 42%).  Spirometry consistent with mixed obstructive and restrictive disease.   TTE 06/10/2020: 1. Left ventricular ejection fraction, by estimation, is 60 to 65%. The  left ventricle has normal function. The left ventricle has no regional  wall motion abnormalities. Left ventricular diastolic parameters were  normal.  2. Right ventricular systolic function is normal. The right ventricular  size is normal. Tricuspid regurgitation signal is inadequate for assessing  PA pressure.  3. The mitral valve is normal in structure. Trivial mitral valve  regurgitation. No evidence of mitral stenosis.  4. Aortic valve appears grossly normal. Unable to determine valve  morphology due to image quality. Aortic valve regurgitation is not  visualized. No aortic stenosis is present.  5. The inferior vena cava is normal in size with greater than 50%  respiratory variability, suggesting right atrial  pressure of 3 mmHg.    Wynonia Musty [facility] Short Stay Center/Anesthesiology Phone [phone] 07/20/2020 1:28 PM

## 2020-07-21 DIAGNOSIS — J3089 Other allergic rhinitis: Secondary | ICD-10-CM | POA: Diagnosis not present

## 2020-07-21 NOTE — Progress Notes (Signed)
VIALS EXP 07-21-21

## 2020-07-24 ENCOUNTER — Other Ambulatory Visit: Payer: Self-pay | Admitting: Obstetrics & Gynecology

## 2020-07-25 ENCOUNTER — Other Ambulatory Visit (HOSPITAL_COMMUNITY)
Admission: RE | Admit: 2020-07-25 | Discharge: 2020-07-25 | Disposition: A | Discharge status: Home / Self Care | Payer: Medicare HMO | Source: Ambulatory Visit | Attending: Neurosurgery | Admitting: Neurosurgery

## 2020-07-25 DIAGNOSIS — Z20822 Contact with and (suspected) exposure to covid-19: Secondary | ICD-10-CM | POA: Insufficient documentation

## 2020-07-25 DIAGNOSIS — Z01812 Encounter for preprocedural laboratory examination: Secondary | ICD-10-CM | POA: Insufficient documentation

## 2020-07-25 LAB — SARS CORONAVIRUS 2 (TAT 6-24 HRS): SARS Coronavirus 2: NEGATIVE

## 2020-07-26 ENCOUNTER — Other Ambulatory Visit (HOSPITAL_COMMUNITY): Payer: Medicare HMO

## 2020-07-27 ENCOUNTER — Telehealth (HOSPITAL_COMMUNITY): Payer: Medicare HMO | Admitting: Psychiatry

## 2020-07-28 ENCOUNTER — Ambulatory Visit (HOSPITAL_COMMUNITY): Payer: Medicare HMO

## 2020-07-28 ENCOUNTER — Encounter (HOSPITAL_COMMUNITY)
Admission: RE | Disposition: A | Discharge status: Home / Self Care | Payer: Self-pay | Source: Home / Self Care | Attending: Neurosurgery

## 2020-07-28 ENCOUNTER — Ambulatory Visit (HOSPITAL_COMMUNITY): Payer: Medicare HMO | Admitting: Anesthesiology

## 2020-07-28 ENCOUNTER — Encounter (HOSPITAL_COMMUNITY): Payer: Self-pay | Admitting: Neurosurgery

## 2020-07-28 ENCOUNTER — Ambulatory Visit (HOSPITAL_COMMUNITY): Payer: Medicare HMO | Admitting: Physician Assistant

## 2020-07-28 ENCOUNTER — Other Ambulatory Visit: Payer: Self-pay

## 2020-07-28 ENCOUNTER — Ambulatory Visit (HOSPITAL_COMMUNITY)
Admission: RE | Admit: 2020-07-28 | Discharge: 2020-07-29 | Disposition: A | Discharge status: Home / Self Care | Payer: Medicare HMO | Attending: Neurosurgery | Admitting: Neurosurgery

## 2020-07-28 DIAGNOSIS — M4322 Fusion of spine, cervical region: Secondary | ICD-10-CM | POA: Diagnosis not present

## 2020-07-28 DIAGNOSIS — J449 Chronic obstructive pulmonary disease, unspecified: Secondary | ICD-10-CM | POA: Insufficient documentation

## 2020-07-28 DIAGNOSIS — M4802 Spinal stenosis, cervical region: Secondary | ICD-10-CM | POA: Diagnosis not present

## 2020-07-28 DIAGNOSIS — M4712 Other spondylosis with myelopathy, cervical region: Secondary | ICD-10-CM | POA: Diagnosis not present

## 2020-07-28 DIAGNOSIS — Z87891 Personal history of nicotine dependence: Secondary | ICD-10-CM | POA: Diagnosis not present

## 2020-07-28 DIAGNOSIS — Z885 Allergy status to narcotic agent status: Secondary | ICD-10-CM | POA: Insufficient documentation

## 2020-07-28 DIAGNOSIS — Z7951 Long term (current) use of inhaled steroids: Secondary | ICD-10-CM | POA: Insufficient documentation

## 2020-07-28 DIAGNOSIS — Z882 Allergy status to sulfonamides status: Secondary | ICD-10-CM | POA: Insufficient documentation

## 2020-07-28 DIAGNOSIS — Z8249 Family history of ischemic heart disease and other diseases of the circulatory system: Secondary | ICD-10-CM | POA: Insufficient documentation

## 2020-07-28 DIAGNOSIS — I10 Essential (primary) hypertension: Secondary | ICD-10-CM | POA: Insufficient documentation

## 2020-07-28 DIAGNOSIS — I739 Peripheral vascular disease, unspecified: Secondary | ICD-10-CM | POA: Diagnosis not present

## 2020-07-28 DIAGNOSIS — K219 Gastro-esophageal reflux disease without esophagitis: Secondary | ICD-10-CM | POA: Insufficient documentation

## 2020-07-28 DIAGNOSIS — M50121 Cervical disc disorder at C4-C5 level with radiculopathy: Secondary | ICD-10-CM | POA: Insufficient documentation

## 2020-07-28 DIAGNOSIS — Z981 Arthrodesis status: Secondary | ICD-10-CM | POA: Diagnosis not present

## 2020-07-28 DIAGNOSIS — M4722 Other spondylosis with radiculopathy, cervical region: Secondary | ICD-10-CM | POA: Insufficient documentation

## 2020-07-28 DIAGNOSIS — F319 Bipolar disorder, unspecified: Secondary | ICD-10-CM | POA: Insufficient documentation

## 2020-07-28 DIAGNOSIS — Z419 Encounter for procedure for purposes other than remedying health state, unspecified: Secondary | ICD-10-CM

## 2020-07-28 DIAGNOSIS — Z79899 Other long term (current) drug therapy: Secondary | ICD-10-CM | POA: Insufficient documentation

## 2020-07-28 DIAGNOSIS — Z7952 Long term (current) use of systemic steroids: Secondary | ICD-10-CM | POA: Insufficient documentation

## 2020-07-28 DIAGNOSIS — M797 Fibromyalgia: Secondary | ICD-10-CM | POA: Insufficient documentation

## 2020-07-28 DIAGNOSIS — M5001 Cervical disc disorder with myelopathy,  high cervical region: Secondary | ICD-10-CM | POA: Diagnosis not present

## 2020-07-28 DIAGNOSIS — M5011 Cervical disc disorder with radiculopathy,  high cervical region: Secondary | ICD-10-CM | POA: Diagnosis not present

## 2020-07-28 DIAGNOSIS — Z7982 Long term (current) use of aspirin: Secondary | ICD-10-CM | POA: Diagnosis not present

## 2020-07-28 DIAGNOSIS — M1990 Unspecified osteoarthritis, unspecified site: Secondary | ICD-10-CM | POA: Diagnosis not present

## 2020-07-28 HISTORY — PX: ANTERIOR CERVICAL DECOMP/DISCECTOMY FUSION: SHX1161

## 2020-07-28 SURGERY — ANTERIOR CERVICAL DECOMPRESSION/DISCECTOMY FUSION 2 LEVELS
Anesthesia: General | Site: Spine Cervical

## 2020-07-28 MED ORDER — ACETAMINOPHEN 500 MG PO TABS
1000.0000 mg | ORAL_TABLET | Freq: Four times a day (QID) | ORAL | Status: DC
  Administered 2020-07-28 – 2020-07-29 (×3): 1000 mg via ORAL
  Filled 2020-07-28 (×3): qty 2

## 2020-07-28 MED ORDER — DEXAMETHASONE 4 MG PO TABS
4.0000 mg | ORAL_TABLET | Freq: Four times a day (QID) | ORAL | Status: AC
  Administered 2020-07-28: 4 mg via ORAL
  Filled 2020-07-28: qty 1

## 2020-07-28 MED ORDER — CEFAZOLIN SODIUM-DEXTROSE 2-4 GM/100ML-% IV SOLN
2.0000 g | Freq: Three times a day (TID) | INTRAVENOUS | Status: AC
  Administered 2020-07-28 – 2020-07-29 (×2): 2 g via INTRAVENOUS
  Filled 2020-07-28 (×2): qty 100

## 2020-07-28 MED ORDER — ONDANSETRON HCL 4 MG/2ML IJ SOLN
INTRAMUSCULAR | Status: DC | PRN
  Administered 2020-07-28: 4 mg via INTRAVENOUS

## 2020-07-28 MED ORDER — LIDOCAINE 2% (20 MG/ML) 5 ML SYRINGE
INTRAMUSCULAR | Status: DC | PRN
  Administered 2020-07-28: 40 mg via INTRAVENOUS

## 2020-07-28 MED ORDER — PROPOFOL 10 MG/ML IV BOLUS
INTRAVENOUS | Status: AC
  Filled 2020-07-28: qty 20

## 2020-07-28 MED ORDER — ALBUTEROL SULFATE (2.5 MG/3ML) 0.083% IN NEBU: 3.0000 mL | INHALATION_SOLUTION | RESPIRATORY_TRACT | Status: DC | PRN

## 2020-07-28 MED ORDER — PROPOFOL 10 MG/ML IV BOLUS
INTRAVENOUS | Status: DC | PRN
  Administered 2020-07-28: 30 mg via INTRAVENOUS
  Administered 2020-07-28: 120 mg via INTRAVENOUS

## 2020-07-28 MED ORDER — BISACODYL 10 MG RE SUPP: 10.0000 mg | Freq: Every day | RECTAL | Status: DC | PRN

## 2020-07-28 MED ORDER — CHLORHEXIDINE GLUCONATE 0.12 % MT SOLN
OROMUCOSAL | Status: AC
  Administered 2020-07-28: 15 mL via OROMUCOSAL
  Filled 2020-07-28: qty 15

## 2020-07-28 MED ORDER — BUDESON-GLYCOPYRROL-FORMOTEROL 160-9-4.8 MCG/ACT IN AERO: 2.0000 | INHALATION_SPRAY | Freq: Two times a day (BID) | RESPIRATORY_TRACT | Status: DC

## 2020-07-28 MED ORDER — ROCURONIUM BROMIDE 10 MG/ML (PF) SYRINGE
PREFILLED_SYRINGE | INTRAVENOUS | Status: DC | PRN
  Administered 2020-07-28: 20 mg via INTRAVENOUS
  Administered 2020-07-28: 50 mg via INTRAVENOUS

## 2020-07-28 MED ORDER — SODIUM CHLORIDE 0.9 % IV SOLN
INTRAVENOUS | Status: DC | PRN
  Administered 2020-07-28: 500 mL

## 2020-07-28 MED ORDER — BUPIVACAINE-EPINEPHRINE (PF) 0.5% -1:200000 IJ SOLN
INTRAMUSCULAR | Status: DC | PRN
  Administered 2020-07-28: 10 mL

## 2020-07-28 MED ORDER — ORAL CARE MOUTH RINSE: 15.0000 mL | Freq: Once | OROMUCOSAL | Status: AC

## 2020-07-28 MED ORDER — OXYCODONE HCL 5 MG PO TABS
10.0000 mg | ORAL_TABLET | ORAL | Status: DC | PRN
  Administered 2020-07-28 – 2020-07-29 (×3): 10 mg via ORAL
  Filled 2020-07-28 (×3): qty 2

## 2020-07-28 MED ORDER — ACETAMINOPHEN 325 MG PO TABS: 650.0000 mg | ORAL_TABLET | ORAL | Status: DC | PRN

## 2020-07-28 MED ORDER — ACETAMINOPHEN 650 MG RE SUPP: 650.0000 mg | RECTAL | Status: DC | PRN

## 2020-07-28 MED ORDER — FLUTICASONE PROPIONATE 50 MCG/ACT NA SUSP
1.0000 | Freq: Every day | NASAL | Status: DC | PRN
  Filled 2020-07-28: qty 16

## 2020-07-28 MED ORDER — MONTELUKAST SODIUM 10 MG PO TABS
10.0000 mg | ORAL_TABLET | Freq: Every day | ORAL | Status: DC
  Administered 2020-07-28: 10 mg via ORAL
  Filled 2020-07-28 (×2): qty 1

## 2020-07-28 MED ORDER — PANTOPRAZOLE SODIUM 40 MG PO TBEC: 40.0000 mg | DELAYED_RELEASE_TABLET | Freq: Every day | ORAL | Status: DC

## 2020-07-28 MED ORDER — MORPHINE SULFATE (PF) 4 MG/ML IV SOLN
4.0000 mg | INTRAVENOUS | Status: DC | PRN
  Administered 2020-07-28: 4 mg via INTRAVENOUS
  Filled 2020-07-28: qty 1

## 2020-07-28 MED ORDER — ESTRADIOL 1 MG PO TABS
0.5000 mg | ORAL_TABLET | Freq: Every day | ORAL | Status: DC
  Administered 2020-07-28 – 2020-07-29 (×2): 0.5 mg via ORAL
  Filled 2020-07-28 (×2): qty 0.5

## 2020-07-28 MED ORDER — THROMBIN 5000 UNITS EX SOLR
CUTANEOUS | Status: AC
  Filled 2020-07-28: qty 5000

## 2020-07-28 MED ORDER — 0.9 % SODIUM CHLORIDE (POUR BTL) OPTIME
TOPICAL | Status: DC | PRN
  Administered 2020-07-28: 1000 mL

## 2020-07-28 MED ORDER — SUFENTANIL CITRATE 50 MCG/ML IV SOLN
INTRAVENOUS | Status: DC | PRN
  Administered 2020-07-28: 10 ug via INTRAVENOUS
  Administered 2020-07-28 (×5): 5 ug via INTRAVENOUS
  Administered 2020-07-28: 15 ug via INTRAVENOUS

## 2020-07-28 MED ORDER — THROMBIN 5000 UNITS EX SOLR
OROMUCOSAL | Status: DC | PRN
  Administered 2020-07-28: 5 mL via TOPICAL

## 2020-07-28 MED ORDER — CYCLOBENZAPRINE HCL 10 MG PO TABS
10.0000 mg | ORAL_TABLET | Freq: Three times a day (TID) | ORAL | Status: DC | PRN
  Administered 2020-07-28 – 2020-07-29 (×2): 10 mg via ORAL
  Filled 2020-07-28 (×2): qty 1

## 2020-07-28 MED ORDER — ZOLPIDEM TARTRATE 5 MG PO TABS: 5.0000 mg | ORAL_TABLET | Freq: Every evening | ORAL | Status: DC | PRN

## 2020-07-28 MED ORDER — PANTOPRAZOLE SODIUM 40 MG PO TBEC
40.0000 mg | DELAYED_RELEASE_TABLET | Freq: Every day | ORAL | Status: DC
  Administered 2020-07-28: 40 mg via ORAL
  Filled 2020-07-28: qty 1

## 2020-07-28 MED ORDER — DEXAMETHASONE SODIUM PHOSPHATE 10 MG/ML IJ SOLN
INTRAMUSCULAR | Status: AC
  Filled 2020-07-28: qty 1

## 2020-07-28 MED ORDER — FLUTICASONE FUROATE-VILANTEROL 200-25 MCG/INH IN AEPB
1.0000 | INHALATION_SPRAY | Freq: Every day | RESPIRATORY_TRACT | Status: DC
  Administered 2020-07-29: 1 via RESPIRATORY_TRACT
  Filled 2020-07-28: qty 28

## 2020-07-28 MED ORDER — PHENYLEPHRINE 40 MCG/ML (10ML) SYRINGE FOR IV PUSH (FOR BLOOD PRESSURE SUPPORT)
PREFILLED_SYRINGE | INTRAVENOUS | Status: DC | PRN
  Administered 2020-07-28 (×4): 80 ug via INTRAVENOUS

## 2020-07-28 MED ORDER — CHLORHEXIDINE GLUCONATE CLOTH 2 % EX PADS: 6.0000 | MEDICATED_PAD | Freq: Once | CUTANEOUS | Status: DC

## 2020-07-28 MED ORDER — DOXEPIN HCL 25 MG PO CAPS
25.0000 mg | ORAL_CAPSULE | Freq: Every evening | ORAL | Status: DC | PRN
  Filled 2020-07-28: qty 1

## 2020-07-28 MED ORDER — PHENYLEPHRINE 40 MCG/ML (10ML) SYRINGE FOR IV PUSH (FOR BLOOD PRESSURE SUPPORT)
PREFILLED_SYRINGE | INTRAVENOUS | Status: AC
  Filled 2020-07-28: qty 10

## 2020-07-28 MED ORDER — CHLORHEXIDINE GLUCONATE 0.12 % MT SOLN: 15.0000 mL | Freq: Once | OROMUCOSAL | Status: AC

## 2020-07-28 MED ORDER — LIDOCAINE 2% (20 MG/ML) 5 ML SYRINGE
INTRAMUSCULAR | Status: AC
  Filled 2020-07-28: qty 5

## 2020-07-28 MED ORDER — ONDANSETRON HCL 4 MG PO TABS: 4.0000 mg | ORAL_TABLET | Freq: Four times a day (QID) | ORAL | Status: DC | PRN

## 2020-07-28 MED ORDER — LACTATED RINGERS IV SOLN: INTRAVENOUS | Status: DC

## 2020-07-28 MED ORDER — SODIUM CHLORIDE (PF) 0.9 % IJ SOLN
INTRAMUSCULAR | Status: AC
  Filled 2020-07-28: qty 10

## 2020-07-28 MED ORDER — SUFENTANIL CITRATE 50 MCG/ML IV SOLN
INTRAVENOUS | Status: AC
  Filled 2020-07-28: qty 1

## 2020-07-28 MED ORDER — MIDAZOLAM HCL 5 MG/5ML IJ SOLN
INTRAMUSCULAR | Status: DC | PRN
  Administered 2020-07-28: 2 mg via INTRAVENOUS

## 2020-07-28 MED ORDER — FAMOTIDINE 20 MG PO TABS
20.0000 mg | ORAL_TABLET | Freq: Two times a day (BID) | ORAL | Status: DC
  Administered 2020-07-28 – 2020-07-29 (×2): 20 mg via ORAL
  Filled 2020-07-28 (×2): qty 1

## 2020-07-28 MED ORDER — MENTHOL 3 MG MT LOZG
1.0000 | LOZENGE | OROMUCOSAL | Status: DC | PRN
  Administered 2020-07-29: 3 mg via ORAL
  Filled 2020-07-28: qty 9

## 2020-07-28 MED ORDER — CARBAMAZEPINE 200 MG PO TABS
400.0000 mg | ORAL_TABLET | Freq: Two times a day (BID) | ORAL | Status: DC
  Administered 2020-07-28 – 2020-07-29 (×2): 400 mg via ORAL
  Filled 2020-07-28 (×3): qty 2

## 2020-07-28 MED ORDER — CEFAZOLIN SODIUM-DEXTROSE 2-4 GM/100ML-% IV SOLN
INTRAVENOUS | Status: AC
  Filled 2020-07-28: qty 100

## 2020-07-28 MED ORDER — KETOROLAC TROMETHAMINE 30 MG/ML IJ SOLN
INTRAMUSCULAR | Status: DC | PRN
  Administered 2020-07-28: 30 mg via INTRAVENOUS

## 2020-07-28 MED ORDER — UMECLIDINIUM BROMIDE 62.5 MCG/INH IN AEPB
1.0000 | INHALATION_SPRAY | Freq: Every day | RESPIRATORY_TRACT | Status: DC
  Administered 2020-07-29: 1 via RESPIRATORY_TRACT
  Filled 2020-07-28: qty 7

## 2020-07-28 MED ORDER — KETOROLAC TROMETHAMINE 30 MG/ML IJ SOLN
INTRAMUSCULAR | Status: AC
  Filled 2020-07-28: qty 1

## 2020-07-28 MED ORDER — LORATADINE 10 MG PO TABS
10.0000 mg | ORAL_TABLET | Freq: Every day | ORAL | Status: DC
  Administered 2020-07-28 – 2020-07-29 (×2): 10 mg via ORAL
  Filled 2020-07-28 (×2): qty 1

## 2020-07-28 MED ORDER — PHENOL 1.4 % MT LIQD: 1.0000 | OROMUCOSAL | Status: DC | PRN

## 2020-07-28 MED ORDER — ALBUTEROL SULFATE (2.5 MG/3ML) 0.083% IN NEBU: 2.5000 mg | INHALATION_SOLUTION | Freq: Four times a day (QID) | RESPIRATORY_TRACT | Status: DC | PRN

## 2020-07-28 MED ORDER — DOCUSATE SODIUM 100 MG PO CAPS
100.0000 mg | ORAL_CAPSULE | Freq: Two times a day (BID) | ORAL | Status: DC
  Administered 2020-07-28 – 2020-07-29 (×2): 100 mg via ORAL
  Filled 2020-07-28 (×2): qty 1

## 2020-07-28 MED ORDER — FENTANYL CITRATE (PF) 100 MCG/2ML IJ SOLN
INTRAMUSCULAR | Status: AC
  Filled 2020-07-28: qty 2

## 2020-07-28 MED ORDER — PANTOPRAZOLE SODIUM 40 MG IV SOLR: 40.0000 mg | Freq: Every day | INTRAVENOUS | Status: DC

## 2020-07-28 MED ORDER — ONDANSETRON HCL 4 MG/2ML IJ SOLN: 4.0000 mg | Freq: Once | INTRAMUSCULAR | Status: DC | PRN

## 2020-07-28 MED ORDER — BACITRACIN ZINC 500 UNIT/GM EX OINT
TOPICAL_OINTMENT | CUTANEOUS | Status: AC
  Filled 2020-07-28: qty 28.35

## 2020-07-28 MED ORDER — BUPIVACAINE-EPINEPHRINE 0.5% -1:200000 IJ SOLN
INTRAMUSCULAR | Status: AC
  Filled 2020-07-28: qty 1

## 2020-07-28 MED ORDER — LEVOCETIRIZINE DIHYDROCHLORIDE 5 MG PO TABS: 5.0000 mg | ORAL_TABLET | Freq: Every day | ORAL | Status: DC

## 2020-07-28 MED ORDER — BACITRACIN ZINC 500 UNIT/GM EX OINT
TOPICAL_OINTMENT | CUTANEOUS | Status: DC | PRN
  Administered 2020-07-28: 1 via TOPICAL

## 2020-07-28 MED ORDER — ALUM & MAG HYDROXIDE-SIMETH 200-200-20 MG/5ML PO SUSP: 30.0000 mL | Freq: Four times a day (QID) | ORAL | Status: DC | PRN

## 2020-07-28 MED ORDER — ROCURONIUM BROMIDE 10 MG/ML (PF) SYRINGE
PREFILLED_SYRINGE | INTRAVENOUS | Status: AC
  Filled 2020-07-28: qty 10

## 2020-07-28 MED ORDER — ONDANSETRON HCL 4 MG/2ML IJ SOLN: 4.0000 mg | Freq: Four times a day (QID) | INTRAMUSCULAR | Status: DC | PRN

## 2020-07-28 MED ORDER — ONDANSETRON HCL 4 MG/2ML IJ SOLN
INTRAMUSCULAR | Status: AC
  Filled 2020-07-28: qty 2

## 2020-07-28 MED ORDER — MOMETASONE FURO-FORMOTEROL FUM 200-5 MCG/ACT IN AERO
2.0000 | INHALATION_SPRAY | Freq: Two times a day (BID) | RESPIRATORY_TRACT | Status: DC
  Administered 2020-07-28 – 2020-07-29 (×2): 2 via RESPIRATORY_TRACT
  Filled 2020-07-28: qty 8.8

## 2020-07-28 MED ORDER — OXYCODONE HCL 5 MG PO TABS: 5.0000 mg | ORAL_TABLET | ORAL | Status: DC | PRN

## 2020-07-28 MED ORDER — FENTANYL CITRATE (PF) 100 MCG/2ML IJ SOLN
25.0000 ug | INTRAMUSCULAR | Status: DC | PRN
  Administered 2020-07-28: 25 ug via INTRAVENOUS

## 2020-07-28 MED ORDER — DEXAMETHASONE SODIUM PHOSPHATE 10 MG/ML IJ SOLN
INTRAMUSCULAR | Status: DC | PRN
  Administered 2020-07-28: 10 mg via INTRAVENOUS

## 2020-07-28 MED ORDER — MIDAZOLAM HCL 2 MG/2ML IJ SOLN
INTRAMUSCULAR | Status: AC
  Filled 2020-07-28: qty 2

## 2020-07-28 MED ORDER — DEXAMETHASONE SODIUM PHOSPHATE 4 MG/ML IJ SOLN
4.0000 mg | Freq: Four times a day (QID) | INTRAMUSCULAR | Status: AC
  Administered 2020-07-28: 4 mg via INTRAVENOUS
  Filled 2020-07-28: qty 1

## 2020-07-28 MED ORDER — CEFAZOLIN SODIUM-DEXTROSE 2-4 GM/100ML-% IV SOLN
2.0000 g | INTRAVENOUS | Status: AC
  Administered 2020-07-28: 2 g via INTRAVENOUS

## 2020-07-28 SURGICAL SUPPLY — 75 items
BAG DECANTER FOR FLEXI CONT (MISCELLANEOUS) ×3 IMPLANT
BAND RUBBER #18 3X1/16 STRL (MISCELLANEOUS) IMPLANT
BENZOIN TINCTURE PRP APPL 2/3 (GAUZE/BANDAGES/DRESSINGS) ×3 IMPLANT
BIT DRILL NEURO 2X3.1 SFT TUCH (MISCELLANEOUS) ×1 IMPLANT
BLADE SURG 15 STRL LF DISP TIS (BLADE) ×2 IMPLANT
BLADE SURG 15 STRL SS (BLADE) ×6
BLADE ULTRA TIP 2M (BLADE) ×3 IMPLANT
BUR BARREL STRAIGHT FLUTE 4.0 (BURR) ×3 IMPLANT
BUR MATCHSTICK NEURO 3.0 LAGG (BURR) ×3 IMPLANT
CAGE PEEK VISTAS 11X14X6 (Cage) ×3 IMPLANT
CANISTER SUCT 3000ML PPV (MISCELLANEOUS) ×3 IMPLANT
CARTRIDGE OIL MAESTRO DRILL (MISCELLANEOUS) ×1 IMPLANT
CLOSURE STERI-STRIP 1/2X4 (GAUZE/BANDAGES/DRESSINGS) ×1
CLOSURE WOUND 1/2 X4 (GAUZE/BANDAGES/DRESSINGS) ×1
CLSR STERI-STRIP ANTIMIC 1/2X4 (GAUZE/BANDAGES/DRESSINGS) ×2 IMPLANT
COVER MAYO STAND STRL (DRAPES) ×3 IMPLANT
COVER WAND RF STERILE (DRAPES) ×3 IMPLANT
DECANTER SPIKE VIAL GLASS SM (MISCELLANEOUS) ×3 IMPLANT
DERMABOND ADVANCED (GAUZE/BANDAGES/DRESSINGS) ×2
DERMABOND ADVANCED .7 DNX12 (GAUZE/BANDAGES/DRESSINGS) ×1 IMPLANT
DIFFUSER DRILL AIR PNEUMATIC (MISCELLANEOUS) ×3 IMPLANT
DRAPE LAPAROTOMY 100X72 PEDS (DRAPES) ×3 IMPLANT
DRAPE MICROSCOPE LEICA (MISCELLANEOUS) IMPLANT
DRAPE SURG 17X23 STRL (DRAPES) ×6 IMPLANT
DRILL NEURO 2X3.1 SOFT TOUCH (MISCELLANEOUS) ×3
DRSG OPSITE POSTOP 4X6 (GAUZE/BANDAGES/DRESSINGS) ×3 IMPLANT
ELECT BLADE 4.0 EZ CLEAN MEGAD (MISCELLANEOUS) ×6
ELECT REM PT RETURN 9FT ADLT (ELECTROSURGICAL) ×3
ELECTRODE BLDE 4.0 EZ CLN MEGD (MISCELLANEOUS) ×2 IMPLANT
ELECTRODE REM PT RTRN 9FT ADLT (ELECTROSURGICAL) ×1 IMPLANT
GAUZE 4X4 16PLY RFD (DISPOSABLE) IMPLANT
GAUZE SPONGE 4X4 12PLY STRL (GAUZE/BANDAGES/DRESSINGS) IMPLANT
GLOVE BIO SURGEON STRL SZ 6.5 (GLOVE) ×8 IMPLANT
GLOVE BIO SURGEON STRL SZ8 (GLOVE) ×3 IMPLANT
GLOVE BIO SURGEON STRL SZ8.5 (GLOVE) ×3 IMPLANT
GLOVE BIO SURGEONS STRL SZ 6.5 (GLOVE) ×4
GLOVE BIOGEL PI IND STRL 6.5 (GLOVE) ×2 IMPLANT
GLOVE BIOGEL PI IND STRL 7.0 (GLOVE) ×2 IMPLANT
GLOVE BIOGEL PI IND STRL 7.5 (GLOVE) ×1 IMPLANT
GLOVE BIOGEL PI INDICATOR 6.5 (GLOVE) ×4
GLOVE BIOGEL PI INDICATOR 7.0 (GLOVE) ×4
GLOVE BIOGEL PI INDICATOR 7.5 (GLOVE) ×2
GLOVE ECLIPSE 7.5 STRL STRAW (GLOVE) ×3 IMPLANT
GLOVE EXAM NITRILE XL STR (GLOVE) IMPLANT
GLOVE SS BIOGEL STRL SZ 7 (GLOVE) ×3 IMPLANT
GLOVE SUPERSENSE BIOGEL SZ 7 (GLOVE) ×6
GOWN STRL REUS W/ TWL LRG LVL3 (GOWN DISPOSABLE) ×3 IMPLANT
GOWN STRL REUS W/ TWL XL LVL3 (GOWN DISPOSABLE) ×2 IMPLANT
GOWN STRL REUS W/TWL LRG LVL3 (GOWN DISPOSABLE) ×9
GOWN STRL REUS W/TWL XL LVL3 (GOWN DISPOSABLE) ×6
HEMOSTAT POWDER KIT SURGIFOAM (HEMOSTASIS) ×3 IMPLANT
KIT BASIN OR (CUSTOM PROCEDURE TRAY) ×3 IMPLANT
KIT TURNOVER KIT B (KITS) ×3 IMPLANT
MARKER SKIN DUAL TIP RULER LAB (MISCELLANEOUS) ×3 IMPLANT
NEEDLE HYPO 22GX1.5 SAFETY (NEEDLE) ×3 IMPLANT
NEEDLE SPNL 18GX3.5 QUINCKE PK (NEEDLE) ×3 IMPLANT
NS IRRIG 1000ML POUR BTL (IV SOLUTION) ×3 IMPLANT
OIL CARTRIDGE MAESTRO DRILL (MISCELLANEOUS) ×3
PACK LAMINECTOMY NEURO (CUSTOM PROCEDURE TRAY) ×3 IMPLANT
PATTIES SURGICAL 1X1 (DISPOSABLE) ×3 IMPLANT
PEEK S VISTA 7X11X14 (Peek) ×3 IMPLANT
PENCIL BUTTON HOLSTER BLD 10FT (ELECTRODE) ×3 IMPLANT
PIN DISTRACTION 14MM (PIN) ×6 IMPLANT
PLATE ANT CERV XTEND 2 LV 30 (Plate) ×3 IMPLANT
PUTTY DBM 5CC CALC GRAN ×3 IMPLANT
SCREW XTD VAR 4.2 SELF TAP 12 (Screw) ×18 IMPLANT
SPONGE INTESTINAL PEANUT (DISPOSABLE) ×9 IMPLANT
SPONGE SURGIFOAM ABS GEL SZ50 (HEMOSTASIS) IMPLANT
STRIP CLOSURE SKIN 1/2X4 (GAUZE/BANDAGES/DRESSINGS) ×2 IMPLANT
SUT VIC AB 0 CT1 27 (SUTURE) ×3
SUT VIC AB 0 CT1 27XBRD ANTBC (SUTURE) ×1 IMPLANT
SUT VIC AB 3-0 SH 8-18 (SUTURE) ×6 IMPLANT
TOWEL GREEN STERILE (TOWEL DISPOSABLE) ×3 IMPLANT
TOWEL GREEN STERILE FF (TOWEL DISPOSABLE) ×3 IMPLANT
WATER STERILE IRR 1000ML POUR (IV SOLUTION) ×3 IMPLANT

## 2020-07-28 NOTE — Anesthesia Procedure Notes (Addendum)
Procedure Name: Intubation Date/Time: 07/28/2020 11:51 AM Performed by: Moshe Salisbury, CRNA Pre-anesthesia Checklist: Patient identified, Emergency Drugs available, Suction available and Patient being monitored Patient Re-evaluated:Patient Re-evaluated prior to induction Oxygen Delivery Method: Circle system utilized Preoxygenation: Pre-oxygenation with 100% oxygen Induction Type: IV induction Ventilation: Mask ventilation without difficulty Laryngoscope Size: Glidescope and 3 Grade View: Grade I Tube type: Oral Tube size: 7.0 mm Number of attempts: 1 Airway Equipment and Method: Rigid stylet and Video-laryngoscopy Placement Confirmation: ETT inserted through vocal cords under direct vision,  positive ETCO2 and breath sounds checked- equal and bilateral Secured at: 21 cm Tube secured with: Tape Dental Injury: Teeth and Oropharynx as per pre-operative assessment

## 2020-07-28 NOTE — H&P (Signed)
Subjective: The patient is a 59 year old black female who has complained of neck pain with bilateral arm pain, numbness, tingling and weakness.  She has failed medical management.  She was worked up with a cervical MRI which demonstrated spondylosis stenosis most prominent at C3-4 and C4-5.  I discussed the various treatment options with her.  She has decided to proceed with surgery.  Past Medical History:  Diagnosis Date  . ALLERGIC RHINITIS 05/12/2009   Qualifier: Diagnosis of  By: Lenn Cal Deborra Medina), Wynona Canes    . Anemia, iron deficiency 12/22/2014   pt. denies  . Anginal pain ([place])   . Anxiety   . Arthritis   . Asthma   . ASTHMA 05/12/2009   Severe AFL (Spirometry 05/2009: pre-BD FEV1 0.87L 34% pred, post-BD FEV1 1.11L 44% pred) Volumes hyperinflated Decreased DLCO that does not fully correct to normal range for alveolar volume.     . Bipolar disorder ([place])   . COPD 08/24/2009   Qualifier: Diagnosis of  By: Burnett Kanaris    . COPD (chronic obstructive pulmonary disease) ([place])   . Depression   . Dyspnea    sometimes when exerting self  . Fibromyalgia 05/14/2014  . GERD (gastroesophageal reflux disease)   . Headache   . History of kidney stones   . Hyperlipidemia 04/20/2017  . HYPERTENSION 05/12/2009   Qualifier: Diagnosis of  By: Lenn Cal Deborra Medina), Wynona Canes    . Peripheral vascular disease ([place])   . Pneumonia   . Prediabetes 02/23/2014   pt. denies  . Seizure ([place])   . Seizure ([place])   . Urticaria     Past Surgical History:  Procedure Laterality Date  . ABDOMINAL HYSTERECTOMY    . BACK SURGERY    . COLONOSCOPY  12/20/2011   Procedure: COLONOSCOPY;  Surgeon: Landry Dyke, MD;  Location: [facility];  Service: Endoscopy;  Laterality: N/A;  . COLONOSCOPY  03/05/2012   Procedure: COLONOSCOPY;  Surgeon: Landry Dyke, MD;  Location: [facility];  Service: Endoscopy;  Laterality: N/A;  . DIAGNOSTIC LAPAROSCOPY    . HEMORRHOID SURGERY    . INCISE AND DRAIN ABCESS    . KIDNEY STONE  SURGERY    . NECK SURGERY     x 2 Dr Orinda Kenner  . SPINE SURGERY  2019  . TOE SURGERY    . TUBAL LIGATION      Allergies  Allergen Reactions  . Codeine Nausea And Vomiting  . Sulfa Antibiotics Nausea And Vomiting  . Sulfonamide Derivatives Nausea And Vomiting    REACTION: n/v    Social History   Tobacco Use  . Smoking status: Former Smoker    Packs/day: 0.25    Years: 36.00    Pack years: 9.00    Types: Cigarettes    Quit date: 01/21/2019    Years since quitting: 1.5  . Smokeless tobacco: Never Used  . Tobacco comment: Passive smoker.  Patient states her quit date was 05/09/2018.  Patient educated with resources at today's appointment to continue to support her to stop smoking.  Substance Use Topics  . Alcohol use: Not Currently    Alcohol/week: 0.0 standard drinks    Family History  Problem Relation Age of Onset  . Diabetes Mother   . COPD Mother   . Heart disease Mother   . Asthma Mother   . Diabetes Father   . Kidney disease Father   . Cancer Father   . Anesthesia problems Father   . Alcohol abuse Father   .  Diabetes Sister   . Hypertension Sister   . Diabetes Brother   . Sleep apnea Brother   . Asthma Brother   . Alcohol abuse Brother   . Heart disease Sister   . Diabetes Sister   . Brain cancer Sister   . Heart disease Brother   . Asthma Sister   . Allergic rhinitis Neg Hx   . Eczema Neg Hx   . Urticaria Neg Hx    Prior to Admission medications   Medication Sig Start Date End Date Taking? Authorizing Provider  albuterol (PROAIR HFA) 108 (90 Base) MCG/ACT inhaler Inhale 4 puffs into the lungs every 4 (four) hours as needed for wheezing or shortness of breath. Patient taking differently: Inhale 2 puffs into the lungs every 4 (four) hours as needed for wheezing or shortness of breath.  05/12/20  Yes Valentina Shaggy, MD  albuterol (PROVENTIL) (2.5 MG/3ML) 0.083% nebulizer solution Take 3 mLs (2.5 mg total) by nebulization every 6 (six) hours as needed for  wheezing or shortness of breath. Reported on 06/04/2016 06/17/20  Yes Biagio Borg, MD  Aspirin-Caffeine [facility] FAST PAIN RELIEF ARTHRITIS) 1000-65 MG PACK Take 1 Package by mouth daily as needed (Knee pain).   Yes [provider]  Aspirin-Salicylamide-Caffeine (BC HEADACHE POWDER PO) Take 1 Package by mouth daily as needed (Headache).   Yes [provider]  Biotin 1000 MCG tablet Take 1,000 mcg by mouth daily.   Yes [provider]  Budeson-Glycopyrrol-Formoterol (BREZTRI AEROSPHERE) 160-9-4.8 MCG/ACT AERO Inhale 2 puffs into the lungs 2 (two) times daily. Use with spacer. 06/28/20  Yes Valentina Shaggy, MD  budesonide-formoterol [facility]) 160-4.5 MCG/ACT inhaler Inhale 2 puffs into the lungs daily.   Yes [provider]  carbamazepine (TEGRETOL) 200 MG tablet Take 2 tablets (400 mg total) by mouth 2 (two) times daily. 06/01/20  Yes Plovsky, Berneta Sages, MD  Cholecalciferol (VITAMIN D) 125 MCG (5000 UT) CAPS Take 5,000 Units by mouth daily.   Yes [provider]  cyclobenzaprine (FLEXERIL) 5 MG tablet Take 1 tablet (5 mg total) by mouth 3 (three) times daily as needed for muscle spasms. 06/17/20  Yes Biagio Borg, MD  diclofenac Sodium (VOLTAREN) 1 % GEL Apply 4 g topically 4 (four) times daily. Patient taking differently: Apply 4 g topically 4 (four) times daily as needed (pain).  06/04/20  Yes Deno Etienne, DO  doxepin (SINEQUAN) 25 MG capsule Take 1 capsule (25 mg total) by mouth at bedtime as needed (itching). 06/28/20  Yes Valentina Shaggy, MD  estradiol (ESTRACE) 0.5 MG tablet TAKE 1 TABLET BY MOUTH DAILY 07/26/20  Yes Princess Bruins, MD  famotidine (PEPCID) 20 MG tablet Take 1 tablet (20 mg total) by mouth 2 (two) times daily. 05/12/20  Yes Valentina Shaggy, MD  fluticasone [facility]) 50 MCG/ACT nasal spray Place 1 spray into both nostrils 2 (two) times daily. Patient taking differently: Place 1 spray into both nostrils daily as needed for  allergies.  05/12/20  Yes Valentina Shaggy, MD  hydrOXYzine (ATARAX/VISTARIL) 50 MG tablet Take 1 tablet (50 mg total) by mouth at bedtime. 06/11/20  Yes Allie Bossier, MD  irbesartan (AVAPRO) 75 MG tablet Take 1 tablet (75 mg total) by mouth daily. 06/12/20  Yes Allie Bossier, MD  levocetirizine (XYZAL) 5 MG tablet Take 1 tablet (5 mg total) by mouth 2 (two) times daily. Patient taking differently: Take 5 mg by mouth daily.  05/12/20  Yes Valentina Shaggy, MD  montelukast (SINGULAIR) 10 MG tablet Take 1 tablet (10 mg total) by mouth at bedtime. 03/30/20  Yes Martyn Ehrich, NP  pantoprazole (PROTONIX) 40 MG tablet 1 by mouth once daily Patient taking differently: Take 40 mg by mouth daily.  03/07/20  Yes Martyn Ehrich, NP  predniSONE (DELTASONE) 10 MG tablet Take 4 tablets (40 mg total) by mouth daily with breakfast for 2 days, THEN 3 tablets (30 mg total) daily with breakfast for 2 days, THEN 2 tablets (20 mg total) daily with breakfast for 2 days, THEN 1 tablet (10 mg total) daily with breakfast for 2 days. 07/20/20 07/28/20 Yes Martyn Ehrich, NP  vitamin C (ASCORBIC ACID) 500 MG tablet Take 500 mg by mouth daily.    Yes [provider]  hydrochlorothiazide (MICROZIDE) 12.5 MG capsule Take 1 capsule (12.5 mg total) by mouth daily. Patient not taking: Reported on 07/14/2020 11/04/19   Biagio Borg, MD  lactulose, encephalopathy, (GENERLAC) 10 GM/15ML SOLN TAKE 45 TO 90 ML( 30-60 GM) BY MOUTH TWICE DAILY AS NEEDED Patient taking differently: Take 30-60 g by mouth 2 (two) times daily as needed (constipation). TAKE 45 TO 90 ML( 30-60 GM) BY MOUTH TWICE DAILY AS NEEDED 03/20/19   Biagio Borg, MD  pravastatin (PRAVACHOL) 40 MG tablet TAKE 1 TABLET(40 MG) BY MOUTH DAILY Patient not taking: Reported on 07/14/2020 11/04/19   Biagio Borg, MD  triamcinolone cream (KENALOG) 0.1 % APPLY EXTERNALLY TO THE AFFECTED AREA TWICE DAILY Patient taking differently: Apply 1 application  topically daily as needed (Rash).  04/14/20   Biagio Borg, MD     Review of Systems  Positive ROS: As above  All other systems have been reviewed and were otherwise negative with the exception of those mentioned in the HPI and as above.  Objective: Vital signs in last 24 hours: Temp:  [98.6 F (37 C)] 98.6 F (37 C) (08/12 0819) Pulse Rate:  [68] 68 (08/12 0819) Resp:  [17] 17 (08/12 0819) BP: (139)/(72) 139/72 (08/12 0819) SpO2:  [100 %] 100 % (08/12 0819) Weight:  [71.5 kg] 71.5 kg (08/12 0819) Estimated body mass index is 27.92 kg/m as calculated from the following:   Height as of this encounter: 5\' 3"  (1.6 m).   Weight as of this encounter: 71.5 kg.   General Appearance: Alert Head: Normocephalic, without obvious abnormality, atraumatic Eyes: PERRL, conjunctiva/corneas clear, EOM's intact,    Ears: Normal  Throat: Normal  Neck: Supple, Back: unremarkable Lungs: Clear to auscultation bilaterally, respirations unlabored Heart: Regular rate and rhythm, no murmur, rub or gallop Abdomen: Soft, non-tender Extremities: Extremities normal, atraumatic, no cyanosis or edema Skin: unremarkable  NEUROLOGIC:   Mental status: alert and oriented,Motor Exam - grossly normal Sensory Exam - grossly normal Reflexes:  Coordination - grossly normal Gait - grossly normal Balance - grossly normal Cranial Nerves: I: smell Not tested  II: visual acuity  OS: Normal  OD: Normal   II: visual fields Full to confrontation  II: pupils Equal, round, reactive to light  III,VII: ptosis None  III,IV,VI: extraocular muscles  Full ROM  V: mastication Normal  V: facial light touch sensation  Normal  V,VII: corneal reflex  Present  VII: facial muscle function - upper  Normal  VII: facial muscle function - lower Normal  VIII: hearing Not tested  IX: soft palate elevation  Normal  IX,X: gag reflex Present  XI: trapezius strength  5/5  XI: sternocleidomastoid strength 5/5  XI: neck flexion  strength  5/5  XII: tongue strength  Normal    Data Review Lab Results  Component Value Date   WBC 10.9 (H) 07/19/2020   HGB 11.0 (L) 07/19/2020   HCT 35.7 (L) 07/19/2020   MCV 89.3 07/19/2020   PLT 411 (H) 07/19/2020   Lab Results  Component Value Date   NA 138 07/19/2020   K 4.4 07/19/2020   CL 102 07/19/2020   CO2 27 07/19/2020   BUN 11 07/19/2020   CREATININE 0.68 07/19/2020   GLUCOSE 81 07/19/2020   No results found for: INR, PROTIME  Assessment/Plan: Cervical spondylosis, cervical stenosis, cervical radiculopathy, cervical myelopathy: I have discussed the situation with the patient.  I have reviewed her MRI scan with her and pointed out the abnormalities.  We have discussed the various treatment options including surgery.  I have described the surgical treatment option of a C3-4 and C4-5 anterior cervicectomy, fusion and plating.  I have shown her surgical models.  I have given her a surgical pamphlet.  We have discussed the risks, benefits, alternatives, expected postoperative course, and likelihood of achieving our goals with surgery.  I have answered all her questions.  She has decided to proceed with surgery.   Ophelia Charter 07/28/2020 10:37 AM

## 2020-07-28 NOTE — Anesthesia Postprocedure Evaluation (Signed)
Anesthesia Post Note  Patient: DEZHANE STATEN  Procedure(s) Performed: ANTERIOR CERVICAL DECOMPRESSION/DISCECTOMY FUSION. INTERBODY PROTHESIS, PLATE/SCREWS CERVICAL THREE- CERVICAL FOUR, CERVICAL FOUR- CERVICAL FIVE (N/A Spine Cervical)     Patient location during evaluation: PACU Anesthesia Type: General Level of consciousness: awake and alert Pain management: pain level controlled Vital Signs Assessment: post-procedure vital signs reviewed and stable Respiratory status: spontaneous breathing, nonlabored ventilation, respiratory function stable and patient connected to nasal cannula oxygen Cardiovascular status: blood pressure returned to baseline and stable Postop Assessment: no apparent nausea or vomiting Anesthetic complications: no   No complications documented.  Last Vitals:  Vitals:   07/28/20 1550 07/28/20 1615  BP: 121/84 (!) 146/85  Pulse: 77 87  Resp: 16 20  Temp: 37.2 C 36.8 C  SpO2: 96% 94%    Last Pain:  Vitals:   07/28/20 1615  TempSrc: Oral  PainSc:                  Prentiss Polio P Dajour [place]

## 2020-07-28 NOTE — Op Note (Signed)
Brief history: The patient is a 59 year old black female who has had a previous C5-6 and C6-7 anterior cervical discectomy, fusion and plating.  She has developed recurrent neck and arm pain, numbness, tingling, weakness, etc.  She was worked up with a cervical MRI which diminish spondylosis and stenosis most prominent at C3-4 and C4-5.  I discussed the various treatment options with her.  She has weighed the risks, benefits and alternatives of surgery and decided proceed with a C3-4 and C4-5 anterior cervicectomy, fusion and plating.  Preoperative diagnosis: C3-4 and C4-5 disc degeneration, spondylosis, stenosis, cervicalgia, cervical radiculopathy, cervical myelopathy  Postoperative diagnosis: The same  Procedure: C3-4 and C4-5 anterior cervical discectomy/decompression; C3-4 and C4-5 interbody arthrodesis with local morcellized autograft bone and Zimmer DBM; insertion of interbody prosthesis at C3-4 and C4-5 (Zimmer peek interbody prosthesis); anterior cervical plating from C3-C5 with globus titanium plate  Surgeon: Dr. Earle Gell  Asst.: Dr. Duffy Rhody  Anesthesia: Gen. endotracheal  Estimated blood loss: 125 cc  Drains: None  Complications: None  Description of procedure: The patient was brought to the operating room by the anesthesia team. General endotracheal anesthesia was induced. A roll was placed under the patient's shoulders to keep the neck in the neutral position. The patient's anterior cervical region was then prepared with Betadine scrub and Betadine solution. Sterile drapes were applied.  The area to be incised was then injected with Marcaine with epinephrine solution. I then used a scalpel to make a transverse incision in the patient's left anterior neck. I used the Metzenbaum scissors to dissect through the scar tissue and to divide the platysmal muscle and then to dissect medial to the sternocleidomastoid muscle, jugular vein, and carotid artery. I carefully dissected  down towards the anterior cervical spine identifying the esophagus and retracting it medially. Then using Kitner swabs to clear soft tissue from the anterior cervical spine. We then inserted a bent spinal needle into the  exposed intervertebral disc space. We then obtained intraoperative radiographs confirm our location.  I then used electrocautery to detach the medial border of the longus colli muscle bilaterally from the C3-4 and C4-5 intervertebral disc spaces. I then inserted the Caspar self-retaining retractor underneath the longus colli muscle bilaterally to provide exposure.  We then incised the intervertebral disc at C4-5. We then performed a partial intervertebral discectomy with a pituitary forceps and the Karlin curettes. I then inserted distraction screws into the vertebral bodies at C4-5. We then distracted the interspace. We then used the high-speed drill to decorticate the vertebral endplates at G1-8, to drill away the remainder of the intervertebral disc, to drill away some posterior spondylosis, and to thin out the posterior longitudinal ligament. I then incised ligament with the arachnoid knife. We then removed the ligament with a Kerrison punches undercutting the vertebral endplates and decompressing the thecal sac. We then performed foraminotomies about the bilateral C5 nerve roots. This completed the decompression at this level.  We then repeated this procedure in analogous fashion at C3-4 decompressing the thecal sac and the bilateral C4 nerve roots.  We now turned our to attention to the interbody fusion. We used the trial spacers to determine the appropriate size for the interbody prosthesis. We then pre-filled prosthesis with a combination of local morcellized autograft bone that we obtained during decompression as well as Zimmer DBM. We then inserted the prosthesis into the distracted interspace at C3-4 and C4-5. We then removed the distraction screws. There was a good snug fit of  the  prosthesis in the interspace.  Having completed the fusion we now turned attention to the anterior spinal instrumentation. We used the high-speed drill to drill away some anterior spondylosis at the disc spaces so that the plate lay down flat. We selected the appropriate length titanium anterior cervical plate. We laid it along the anterior aspect of the vertebral bodies from C3-C5. We then drilled 12 mm holes at C3, C4 and C5. We then secured the plate to the vertebral bodies by placing two 12 mm self-tapping screws at C3, C4 and C5. We then obtained intraoperative radiograph. The demonstrating good position of the instrumentation. We therefore secured the screws the plate the locking each cam. This completed the instrumentation.  We then obtained hemostasis using bipolar electrocautery. We irrigated the wound out with bacitracin solution. We then removed the retractor. We inspected the esophagus for any damage. There was none apparent. We then reapproximated patient's platysmal muscle with interrupted 3-0 Vicryl suture. We then reapproximated the subcutaneous tissue with interrupted 3-0 Vicryl suture. The skin was reapproximated with Steri-Strips and benzoin. The wound was then covered with bacitracin ointment. A sterile dressing was applied. The drapes were removed. Patient was subsequently extubated by the anesthesia team and transported to the post anesthesia care unit in stable condition. All sponge instrument and needle counts were reportedly correct at the end of this case.

## 2020-07-28 NOTE — Progress Notes (Signed)
Subjective: The patient is alert and pleasant.  She looks well.  She is in no apparent distress.  Objective: Vital signs in last 24 hours: Temp:  [98.6 F (37 C)-99.3 F (37.4 C)] 99.3 F (37.4 C) (08/12 1500) Pulse Rate:  [68-91] 91 (08/12 1500) Resp:  [13-17] 13 (08/12 1500) BP: (139-152)/(72-104) 152/104 (08/12 1500) SpO2:  [95 %-100 %] 95 % (08/12 1500) Weight:  [71.5 kg] 71.5 kg (08/12 0819) Estimated body mass index is 27.92 kg/m as calculated from the following:   Height as of this encounter: 5\' 3"  (1.6 m).   Weight as of this encounter: 71.5 kg.   Intake/Output from previous day: No intake/output data recorded. Intake/Output this shift: Total I/O In: 1200 [I.V.:1100; IV Piggyback:100] Out: 200 [Blood:200]  Physical exam the patient is alert and pleasant.  She is moving all 4 extremities well.  Her dressing is clean and dry.  There is no hematoma or shift.  Lab Results: No results for input(s): WBC, HGB, HCT, PLT in the last 72 hours. BMET No results for input(s): NA, K, CL, CO2, GLUCOSE, BUN, CREATININE, CALCIUM in the last 72 hours.  Studies/Results: No results found.  Assessment/Plan: The patient is doing well.  I spoke with her friend, listed as her contact.  LOS: 0 days     Ophelia Charter 07/28/2020, 3:27 PM

## 2020-07-28 NOTE — Transfer of Care (Signed)
Immediate Anesthesia Transfer of Care Note  Patient: [name]  Procedure(s) Performed: ANTERIOR CERVICAL DECOMPRESSION/DISCECTOMY FUSION. INTERBODY PROTHESIS, PLATE/SCREWS CERVICAL THREE- CERVICAL FOUR, CERVICAL FOUR- CERVICAL FIVE (N/A Spine Cervical)  Patient Location: PACU  Anesthesia Type:General  Level of Consciousness: drowsy and patient cooperative  Airway & Oxygen Therapy: Patient Spontanous Breathing and Patient connected to nasal cannula oxygen  Post-op Assessment: Report given to RN, Post -op Vital signs reviewed and stable and Patient moving all extremities  Post vital signs: Reviewed and stable  Last Vitals:  Vitals Value Taken Time  BP 152/104 07/28/20 1501  Temp    Pulse 99 07/28/20 1503  Resp 20 07/28/20 1503  SpO2 96 % 07/28/20 1503  Vitals shown include unvalidated device data.  Last Pain:  Vitals:   07/28/20 0839  PainSc: 0-No pain         Complications: No complications documented.

## 2020-07-29 ENCOUNTER — Encounter (HOSPITAL_COMMUNITY): Payer: Self-pay | Admitting: Neurosurgery

## 2020-07-29 DIAGNOSIS — M4802 Spinal stenosis, cervical region: Secondary | ICD-10-CM | POA: Diagnosis not present

## 2020-07-29 DIAGNOSIS — M50121 Cervical disc disorder at C4-C5 level with radiculopathy: Secondary | ICD-10-CM | POA: Diagnosis not present

## 2020-07-29 DIAGNOSIS — I10 Essential (primary) hypertension: Secondary | ICD-10-CM | POA: Diagnosis not present

## 2020-07-29 DIAGNOSIS — M4712 Other spondylosis with myelopathy, cervical region: Secondary | ICD-10-CM | POA: Diagnosis not present

## 2020-07-29 DIAGNOSIS — J449 Chronic obstructive pulmonary disease, unspecified: Secondary | ICD-10-CM | POA: Diagnosis not present

## 2020-07-29 DIAGNOSIS — I739 Peripheral vascular disease, unspecified: Secondary | ICD-10-CM | POA: Diagnosis not present

## 2020-07-29 DIAGNOSIS — M4722 Other spondylosis with radiculopathy, cervical region: Secondary | ICD-10-CM | POA: Diagnosis not present

## 2020-07-29 DIAGNOSIS — M797 Fibromyalgia: Secondary | ICD-10-CM | POA: Diagnosis not present

## 2020-07-29 DIAGNOSIS — F319 Bipolar disorder, unspecified: Secondary | ICD-10-CM | POA: Diagnosis not present

## 2020-07-29 MED ORDER — OXYCODONE-ACETAMINOPHEN 5-325 MG PO TABS: 1.0000 | ORAL_TABLET | ORAL | 0 refills | Status: DC | PRN

## 2020-07-29 MED ORDER — DOCUSATE SODIUM 100 MG PO CAPS: 100.0000 mg | ORAL_CAPSULE | Freq: Two times a day (BID) | ORAL | 0 refills | Status: DC

## 2020-07-29 MED ORDER — OXYCODONE-ACETAMINOPHEN 5-325 MG PO TABS: 1.0000 | ORAL_TABLET | ORAL | Status: DC | PRN

## 2020-07-29 NOTE — Discharge Instructions (Signed)
Wound Care Leave incision open to air. You may shower. Do not scrub directly on incision.  Do not put any creams, lotions, or ointments on incision. Activity Walk each and every day, increasing distance each day. No lifting greater than 5 lbs.  Avoid excessive neck motion. No driving for 2 weeks; may ride as a passenger locally. Wear neck brace at all times except when showering.  If provided soft collar, may wear for comfort unless otherwise instructed. Diet Resume your normal diet.  Return to Work Will be discussed at you follow up appointment. Call Your Doctor If Any of These Occur Redness, drainage, or swelling at the wound.  Temperature greater than 101 degrees. Severe pain not relieved by pain medication. Increased difficulty swallowing. Incision starts to come apart. Follow Up Appt Call today for appointment in 2-4 weeks ([phone]) or for problems.  If you have any hardware placed in your spine, you will need an x-ray before your appointment. 

## 2020-07-29 NOTE — Evaluation (Signed)
Occupational Therapy Evaluation Patient Details Name: [name] MRN: [id number] DOB: 1961/02/28 Today's Date: 07/29/2020    History of Present Illness 59 yo female s/p ACDF C3-4 C4-5 PMH PMHx:  COPD, HTN, asthma, fibromyalgia, PVD, cervical radiculopathy, migraines (P)    Clinical Impression   Patient evaluated by Occupational Therapy with no further acute OT needs identified. All education has been completed and the patient has no further questions. See below for any follow-up Occupational Therapy or equipment needs. OT to sign off. Thank you for referral.      Follow Up Recommendations  No OT follow up    Equipment Recommendations  None recommended by OT    Recommendations for Other Services       Precautions / Restrictions Precautions Precautions: Cervical Precaution Booklet Issued: Yes (comment) Precaution Comments: handout for precautions provided and discussed for adls Required Braces or Orthoses: Cervical Brace Cervical Brace: Hard collar;At all times Restrictions Weight Bearing Restrictions: No      Mobility Bed Mobility Overal bed mobility: Modified Independent             General bed mobility comments: educated on sequence with mod cues to correctly return demo  Transfers Overall transfer level: Needs assistance Equipment used: Rolling walker (2 wheeled) Transfers: Sit to/from Stand Sit to Stand: Supervision         General transfer comment: min cues for hand placement    Balance Overall balance assessment: Needs assistance Sitting-balance support: Bilateral upper extremity supported;Feet supported Sitting balance-Leahy Scale: Fair     Standing balance support: Single extremity supported;During functional activity Standing balance-Leahy Scale: Fair                             ADL either performed or assessed with clinical judgement   ADL Overall ADL's : Needs assistance/impaired Eating/Feeding: Independent    Grooming: Modified independent             Upper Body Dressing Details (indicate cue type and reason): able to don doff ccollar in bathroom at mirror. pt with brace prior to admission but with questions about changing pads. educated on washing and laying out to dry   Lower Body Dressing Details (indicate cue type and reason): able to figure 4 cross for LB dressing Toilet Transfer: Modified Independent           Functional mobility during ADLs: Supervision/safety;Rolling walker General ADL Comments: pt reports using RW and attempting to walk around room holding onto surfaces priro to OT arrival. Ot provided patient RW to utilize in room this admission.    Cervical precautions ( handout provided): Educated patient on don doff brace with return demonstration, educated on oral care using cups, washing face with cloth, never to wash directly on incision site, avoid neck rotation flexion and extension, positioning with pillows in chair for bil UE, sleeping positioning, avoiding pushing / pulling with bil UE.   Vision Baseline Vision/History: Wears glasses Wears Glasses: Reading only       Perception     Praxis      Pertinent Vitals/Pain Pain Assessment: No/denies pain     Hand Dominance Right   Extremity/Trunk Assessment Upper Extremity Assessment Upper Extremity Assessment: Overall WFL for tasks assessed   Lower Extremity Assessment Lower Extremity Assessment: Overall WFL for tasks assessed   Cervical / Trunk Assessment Cervical / Trunk Assessment: Other exceptions Cervical / Trunk Exceptions: s/p surg   Communication Communication Communication: No difficulties  Cognition Arousal/Alertness: Awake/alert Behavior During Therapy: WFL for tasks assessed/performed Overall Cognitive Status: Within Functional Limits for tasks assessed                                     General Comments  educated on washing around the wound and using clean linens for  each shower to decrease risk for infection    Exercises     Shoulder Instructions      Home Living Family/patient expects to be discharged to:: Private residence Living Arrangements: Alone Available Help at Discharge: Family;Available PRN/intermittently Type of Home: House Home Access: Level entry     Home Layout: One level     Bathroom Shower/Tub: Teacher, early years/pre: Standard     Home Equipment: Environmental consultant - 2 wheels   Additional Comments: reports two daughters, 85 yo grandson and 43 yo granddaughter can (A) PRN.       Prior Functioning/Environment Level of Independence: Independent        Comments: normally drives but states "i know i cant drive yet"         OT Problem List: Impaired balance (sitting and/or standing)      OT Treatment/Interventions:      OT Goals(Current goals can be found in the care plan section) Acute Rehab OT Goals Patient Stated Goal: to go home today Potential to Achieve Goals: Good  OT Frequency:     Barriers to D/C:            Co-evaluation              AM-PAC OT "6 Clicks" Daily Activity     Outcome Measure Help from another person eating meals?: None Help from another person taking care of personal grooming?: None Help from another person toileting, which includes using toliet, bedpan, or urinal?: None Help from another person bathing (including washing, rinsing, drying)?: None Help from another person to put on and taking off regular upper body clothing?: None Help from another person to put on and taking off regular lower body clothing?: None 6 Click Score: 24   End of Session Equipment Utilized During Treatment: Rolling walker;Cervical collar Nurse Communication: Mobility status;Precautions  Activity Tolerance: Patient tolerated treatment well Patient left: in bed;with call bell/phone within reach  OT Visit Diagnosis: Unsteadiness on feet (R26.81)                Time: 6203-5597 OT Time  Calculation (min): 17 min Charges:  OT General Charges $OT Visit: 1 Visit OT Evaluation $OT Eval Low Complexity: 1 Low   Brynn, OTR/L  Acute Rehabilitation Services Pager: [phone] Office: [phone] .   Jeri Modena 07/29/2020, 10:51 AM

## 2020-07-29 NOTE — Discharge Summary (Signed)
Physician Discharge Summary  Patient ID: [name] MRN: [id number] DOB/AGE: 02-20-1961 59 y.o.  Admit date: 07/28/2020 Discharge date: 07/29/2020  Admission Diagnoses: Cervical spondylosis, cervical radiculopathy, cervical myelopathy, cervicalgia  Discharge Diagnoses: The same Active Problems:   Cervical spondylosis with myelopathy and radiculopathy   Discharged Condition: good  Hospital Course: I performed a C3-4 and C4-5 anterior cervical discectomy, fusion and plating on the patient on 07/28/2020.  The surgery went well.  The patient's postoperative course was unremarkable.  On postoperative day #1 she was doing well and requested discharge to home.  She was given written and oral discharge instructions.  All her questions were answered.  Consults: OT Significant Diagnostic Studies: None Treatments: C3-4 and C4-5 anterior cervical discectomy, fusion and plating. Discharge Exam: Blood pressure (!) 141/79, pulse 69, temperature 98.5 F (36.9 C), temperature source Oral, resp. rate 18, height 5\' 3"  (1.6 m), weight 71.5 kg, SpO2 97 %. The patient is alert and pleasant.  She looks well.  Her dressing is clean and dry.  There is no hematoma or shift.  Her strength is normal.  Disposition: Home  Discharge Instructions    Call MD for:  difficulty breathing, headache or visual disturbances   Complete by: As directed    Call MD for:  extreme fatigue   Complete by: As directed    Call MD for:  hives   Complete by: As directed    Call MD for:  persistant dizziness or light-headedness   Complete by: As directed    Call MD for:  persistant nausea and vomiting   Complete by: As directed    Call MD for:  redness, tenderness, or signs of infection (pain, swelling, redness, odor or green/yellow discharge around incision site)   Complete by: As directed    Call MD for:  severe uncontrolled pain   Complete by: As directed    Call MD for:  temperature >100.4   Complete by: As  directed    Diet - low sodium heart healthy   Complete by: As directed    Discharge instructions   Complete by: As directed    Call [phone] for a followup appointment. Take a stool softener while you are using pain medications.   Driving Restrictions   Complete by: As directed    Do not drive for 2 weeks.   Increase activity slowly   Complete by: As directed    Lifting restrictions   Complete by: As directed    Do not lift more than 5 pounds. No excessive bending or twisting.   May shower / Bathe   Complete by: As directed    Remove the dressing for 3 days after surgery.  You may shower, but leave the incision alone.   Remove dressing in 48 hours   Complete by: As directed      Allergies as of 07/29/2020      Reactions   Codeine Nausea And Vomiting   Sulfa Antibiotics Nausea And Vomiting   Sulfonamide Derivatives Nausea And Vomiting   REACTION: n/v      Medication List    STOP taking these medications   BC Fast Pain Relief Arthritis 1000-65 MG Pack Generic drug: Aspirin-Caffeine   BC HEADACHE POWDER PO   pravastatin 40 MG tablet Commonly known as: PRAVACHOL   predniSONE 10 MG tablet Commonly known as: DELTASONE     TAKE these medications   albuterol 108 (90 Base) MCG/ACT inhaler Commonly known as: ProAir HFA Inhale 4 puffs into  the lungs every 4 (four) hours as needed for wheezing or shortness of breath. What changed: how much to take   albuterol (2.5 MG/3ML) 0.083% nebulizer solution Commonly known as: PROVENTIL Take 3 mLs (2.5 mg total) by nebulization every 6 (six) hours as needed for wheezing or shortness of breath. Reported on 06/04/2016 What changed: Another medication with the same name was changed. Make sure you understand how and when to take each.   Biotin 1000 MCG tablet Take 1,000 mcg by mouth daily.   Breztri Aerosphere 160-9-4.8 MCG/ACT Aero Generic drug: Budeson-Glycopyrrol-Formoterol Inhale 2 puffs into the lungs 2 (two) times daily. Use  with spacer.   budesonide-formoterol 160-4.5 MCG/ACT inhaler Commonly known as: SYMBICORT Inhale 2 puffs into the lungs daily.   carbamazepine 200 MG tablet Commonly known as: TEGretol Take 2 tablets (400 mg total) by mouth 2 (two) times daily.   cyclobenzaprine 5 MG tablet Commonly known as: FLEXERIL Take 1 tablet (5 mg total) by mouth 3 (three) times daily as needed for muscle spasms.   diclofenac Sodium 1 % Gel Commonly known as: VOLTAREN Apply 4 g topically 4 (four) times daily. What changed:   when to take this  reasons to take this   docusate sodium 100 MG capsule Commonly known as: COLACE Take 1 capsule (100 mg total) by mouth 2 (two) times daily.   doxepin 25 MG capsule Commonly known as: SINEQUAN Take 1 capsule (25 mg total) by mouth at bedtime as needed (itching).   estradiol 0.5 MG tablet Commonly known as: ESTRACE TAKE 1 TABLET BY MOUTH DAILY   famotidine 20 MG tablet Commonly known as: Pepcid Take 1 tablet (20 mg total) by mouth 2 (two) times daily.   fluticasone 50 MCG/ACT nasal spray Commonly known as: FLONASE Place 1 spray into both nostrils 2 (two) times daily. What changed:   when to take this  reasons to take this   hydrochlorothiazide 12.5 MG capsule Commonly known as: MICROZIDE Take 1 capsule (12.5 mg total) by mouth daily.   hydrOXYzine 50 MG tablet Commonly known as: ATARAX/VISTARIL Take 1 tablet (50 mg total) by mouth at bedtime.   irbesartan 75 MG tablet Commonly known as: AVAPRO Take 1 tablet (75 mg total) by mouth daily.   lactulose (encephalopathy) 10 GM/15ML Soln Commonly known as: Generlac TAKE 45 TO 90 ML( 30-60 GM) BY MOUTH TWICE DAILY AS NEEDED What changed:   how much to take  how to take this  when to take this  reasons to take this   levocetirizine 5 MG tablet Commonly known as: XYZAL Take 1 tablet (5 mg total) by mouth 2 (two) times daily. What changed: when to take this   montelukast 10 MG  tablet Commonly known as: SINGULAIR Take 1 tablet (10 mg total) by mouth at bedtime.   oxyCODONE-acetaminophen 5-325 MG tablet Commonly known as: PERCOCET/ROXICET Take 1-2 tablets by mouth every 4 (four) hours as needed for moderate pain.   pantoprazole 40 MG tablet Commonly known as: PROTONIX 1 by mouth once daily What changed:   how much to take  how to take this  when to take this  additional instructions   triamcinolone cream 0.1 % Commonly known as: KENALOG APPLY EXTERNALLY TO THE AFFECTED AREA TWICE DAILY What changed: See the new instructions.   vitamin C 500 MG tablet Commonly known as: ASCORBIC ACID Take 500 mg by mouth daily.   Vitamin D 125 MCG (5000 UT) Caps Take 5,000 Units by mouth daily.  Signed: Ophelia Charter 07/29/2020, 7:00 AM

## 2020-07-29 NOTE — Progress Notes (Signed)
Patient is discharged from room 3C11 at this time. Alert and in stable condition. IV site d/c'd and instructions read to patient with understanding verbalized and all questions answered. Left unit via wheelchair with all belongings at side. 

## 2020-08-08 ENCOUNTER — Ambulatory Visit (INDEPENDENT_AMBULATORY_CARE_PROVIDER_SITE_OTHER): Payer: Medicare HMO

## 2020-08-08 ENCOUNTER — Other Ambulatory Visit: Payer: Self-pay

## 2020-08-08 DIAGNOSIS — J4540 Moderate persistent asthma, uncomplicated: Secondary | ICD-10-CM | POA: Diagnosis not present

## 2020-08-09 ENCOUNTER — Telehealth: Payer: Self-pay | Admitting: Emergency Medicine

## 2020-08-09 DIAGNOSIS — M4712 Other spondylosis with myelopathy, cervical region: Secondary | ICD-10-CM | POA: Diagnosis not present

## 2020-08-09 DIAGNOSIS — M5011 Cervical disc disorder with radiculopathy,  high cervical region: Secondary | ICD-10-CM | POA: Diagnosis not present

## 2020-08-09 DIAGNOSIS — M4722 Other spondylosis with radiculopathy, cervical region: Secondary | ICD-10-CM | POA: Diagnosis not present

## 2020-08-09 DIAGNOSIS — M5001 Cervical disc disorder with myelopathy,  high cervical region: Secondary | ICD-10-CM | POA: Diagnosis not present

## 2020-08-09 NOTE — Telephone Encounter (Signed)
Called and spoke with pt who stated she began coughing 8/23 in the evening and also had some mild wheeze. Pt did do one neb tx which did help with the wheeze. Pt stated that her daughter took her walking 8/23 as she just had surgery 8/12 and was told that she needed to try to walk more and she feels like she overdid it. Pt did have to use her rescue inhaler once after she finished walking.   Pt woke up today 8/24 still having a mild wheeze so did one neb tx which she states also helped. Pt is coughing up green phlegm and also has green postnasal drainage.  Pt denies any complaints of fever. Pt tried to take her temp while on the phone with me but was having difficulty with her thermometer as it was saying that her temp was 78.  Pt was taking tussionex for her cough and also states that she has been doing Tea with Ginger.  Pt is using her Breztri inhaler as prescribed. Pt is also using all other medications as prescribed. Pt has received both of her covid vaccines.  Pt has been scheduled an appt with Aaron Edelman 8/26 at 3pm. Nothing further needed.

## 2020-08-11 ENCOUNTER — Encounter: Payer: Self-pay | Admitting: Pulmonary Disease

## 2020-08-11 ENCOUNTER — Other Ambulatory Visit: Payer: Self-pay

## 2020-08-11 ENCOUNTER — Ambulatory Visit (INDEPENDENT_AMBULATORY_CARE_PROVIDER_SITE_OTHER): Payer: Medicare HMO | Admitting: Pulmonary Disease

## 2020-08-11 VITALS — BP 122/80 | HR 84 | Temp 98.5°F | Ht 63.0 in | Wt 161.0 lb

## 2020-08-11 DIAGNOSIS — J441 Chronic obstructive pulmonary disease with (acute) exacerbation: Secondary | ICD-10-CM

## 2020-08-11 DIAGNOSIS — J3089 Other allergic rhinitis: Secondary | ICD-10-CM

## 2020-08-11 DIAGNOSIS — Z0000 Encounter for general adult medical examination without abnormal findings: Secondary | ICD-10-CM

## 2020-08-11 MED ORDER — PREDNISONE 10 MG PO TABS: ORAL_TABLET | ORAL | 0 refills | Status: DC

## 2020-08-11 MED ORDER — AZITHROMYCIN 250 MG PO TABS: ORAL_TABLET | ORAL | 0 refills | Status: DC

## 2020-08-11 MED ORDER — ALBUTEROL SULFATE (2.5 MG/3ML) 0.083% IN NEBU: 2.5000 mg | INHALATION_SOLUTION | Freq: Four times a day (QID) | RESPIRATORY_TRACT | 11 refills | Status: DC | PRN

## 2020-08-11 NOTE — Assessment & Plan Note (Signed)
Plan: Recommend seasonal flu vaccine when available Recommend pneumonia vaccine after December/2021

## 2020-08-11 NOTE — Patient Instructions (Addendum)
You were seen today by Felicia Rinne, NP  for:   1. COPD with acute exacerbation (HCC)  - predniSONE (DELTASONE) 10 MG tablet; 4 tabs for 2 days, then 3 tabs for 2 days, 2 tabs for 2 days, then 1 tab for 2 days, then stop  Dispense: 20 tablet; Refill: 0 - azithromycin (ZITHROMAX) 250 MG tablet; 500mg  (two tablets) today, then 250mg  (1 tablet) for the next 4 days  Dispense: 6 tablet; Refill: 0  Azithromycin 250mg  tablet  >>>Take 2 tablets (500mg  total) today, and then 1 tablet (250mg ) for the next four days  >>>take with food  >>>can also take probiotic and / or yogurt while on antibiotic   Prednisone 10mg  tablet  >>>4 tabs for 2 days, then 3 tabs for 2 days, 2 tabs for 2 days, then 1 tab for 2 days, then stop >>>take with food  >>>take in the morning   Breztri >>> 2 puffs in the morning right when you wake up, rinse out your mouth after use, 12 hours later 2 puffs, rinse after use >>> Take this daily, no matter what >>> This is not a rescue inhaler   Only use your albuterol as a rescue medication to be used if you can't catch your breath by resting or doing a relaxed purse lip breathing pattern.  - The less you use it, the better it will work when you need it. - Ok to use up to 2 puffs  every 4 hours if you must but call for immediate appointment if use goes up over your usual need - Don't leave home without it !!  (think of it like the spare tire for your car)   Note your daily symptoms > remember "red flags" for COPD:   >>>Increase in cough >>>increase in sputum production >>>increase in shortness of breath or activity  intolerance.   If you notice these symptoms, please call the office to be seen.   2. Healthcare maintenance  We recommend the seasonal flu vaccine when available  You are due for pneumonia vaccine in December/2021   We recommend today:  No orders of the defined types were placed in this encounter.  No orders of the defined types were placed in this  encounter.  Meds ordered this encounter  Medications  . predniSONE (DELTASONE) 10 MG tablet    Sig: 4 tabs for 2 days, then 3 tabs for 2 days, 2 tabs for 2 days, then 1 tab for 2 days, then stop    Dispense:  20 tablet    Refill:  0  . azithromycin (ZITHROMAX) 250 MG tablet    Sig: 500mg  (two tablets) today, then 250mg  (1 tablet) for the next 4 days    Dispense:  6 tablet    Refill:  0    Follow Up:    Return in about 2 months (around 10/11/2020), or if symptoms worsen or fail to improve, for Follow up with Dr. Lamonte Sakai.   Please do your part to reduce the spread of COVID-19:      Reduce your risk of any infection  and COVID19 by using the similar precautions used for avoiding the common cold or flu:  Marland Kitchen Wash your hands often with soap and warm water for at least 20 seconds.  If soap and water are not readily available, use an alcohol-based hand sanitizer with at least 60% alcohol.  . If coughing or sneezing, cover your mouth and nose by coughing or sneezing into the elbow areas  of your shirt or coat, into a tissue or into your sleeve (not your hands). Langley Gauss A MASK when in public  . Avoid shaking hands with others and consider head nods or verbal greetings only. . Avoid touching your eyes, nose, or mouth with unwashed hands.  . Avoid close contact with people who are sick. . Avoid places or events with large numbers of people in one location, like concerts or sporting events. . If you have some symptoms but not all symptoms, continue to monitor at home and seek medical attention if your symptoms worsen. . If you are having a medical emergency, call 911.   [facility] / e-Visit: [email or web address]         [facility]: [facility]: [phone]                   [facility]: [phone]     It is flu season:   >>> Best  ways to protect herself from the flu: Receive the yearly flu vaccine, practice good hand hygiene washing with soap and also using hand sanitizer when available, eat a nutritious meals, get adequate rest, hydrate appropriately   Please contact the office if your symptoms worsen or you have concerns that you are not improving.   Thank you for choosing  [facility] for your healthcare, and for allowing [place] to partner with you on your healthcare journey. I am thankful to be able to provide care to you today.   Felicia Quaker FNP-C   Chronic Obstructive Pulmonary Disease Exacerbation Chronic obstructive pulmonary disease (COPD) is a long-term (chronic) lung problem. In COPD, the flow of air from the lungs is limited. COPD exacerbations are times that breathing gets worse and you need more than your normal treatment. Without treatment, they can be life threatening. If they happen often, your lungs can become more damaged. If your COPD gets worse, your doctor may treat you with:  Medicines.  Oxygen.  Different ways to clear your airway, such as using a mask. Follow these instructions at home: Medicines  Take over-the-counter and prescription medicines only as told by your doctor.  If you take an antibiotic or steroid medicine, do not stop taking the medicine even if you start to feel better.  Keep up with shots (vaccinations) as told by your doctor. Be sure to get a yearly (annual) flu shot. Lifestyle  Do not smoke. If you need help quitting, ask your doctor.  Eat healthy foods.  Exercise regularly.  Get plenty of sleep.  Avoid tobacco smoke and other things that can bother your lungs.  Wash your hands often with soap and water. This will help keep you from getting an infection. If you cannot use soap and water, use hand sanitizer.  During flu season, avoid areas that are crowded with people. General instructions  Drink enough fluid to keep your pee (urine) clear or pale  yellow. Do not do this if your doctor has told you not to.  Use a cool mist machine (vaporizer).  If you use oxygen or a machine that turns medicine into a mist (nebulizer), continue to use it as told.  Follow all instructions for rehabilitation. These are steps you can take to make your body work better.  Keep all follow-up visits as told by your doctor. This is important. Contact a doctor if:  Your COPD symptoms get worse than normal. Get help right  away if:  You are short of breath and it gets worse.  You have trouble talking.  You have chest pain.  You cough up blood.  You have a fever.  You keep throwing up (vomiting).  You feel weak or you pass out (faint).  You feel confused.  You are not able to sleep because of your symptoms.  You are not able to do daily activities. Summary  COPD exacerbations are times that breathing gets worse and you need more treatment than normal.  COPD exacerbations can be very serious and may cause your lungs to become more damaged.  Do not smoke. If you need help quitting, ask your doctor.  Stay up-to-date on your shots. Get a flu shot every year. This information is not intended to replace advice given to you by your health care provider. Make sure you discuss any questions you have with your health care provider. Document Revised: 11/15/2017 Document Reviewed: 01/07/2017 Elsevier Patient Education  2020 [employer].

## 2020-08-11 NOTE — Progress Notes (Signed)
@Patient  ID: [name], female    DOB: [birth year], 59 y.o.   MRN: [id number]  Chief Complaint  Patient presents with  . Acute Visit    Pt has had a productive cough x3 days with green phlegm. Pt also has been having green postnasal drainage and also has had a mild wheeze.  Pt also has had complaints of increased SOB.    Referring provider: Biagio Borg, MD  HPI:  59 year old female former smoker followed in our office for COPD  PMH: Hypertension, prediabetes, GERD, anxiety, depression, bipolar Smoker/ Smoking History: Former smoker Maintenance:  Breztri Pt of: [name]  08/11/2020  - Visit   59 year old female former smoker followed in our office for COPD.  She is presenting to our office today reporting she has had 3 days of increased cough, congestion and wheezing.  She is having to use her rescue inhaler nebulizer 1-2 times daily.  She reports adherence to [place].  She admits that she did not take it this morning.  She feels she has had increased cough, wheezing as well as dyspnea.  We will evaluate and discuss this today.   Questionaires / Pulmonary Flowsheets:   ACT:  Asthma Control Test ACT Total Score  05/12/2020 14  08/06/2019 21  12/09/2018 8    MMRC: mMRC Dyspnea Scale mMRC Score  08/11/2020 3    [place]:  No flowsheet data found.  Tests:   FENO:  Lab Results  Component Value Date   NITRICOXIDE 19 01/14/2018    PFT: PFT Results Latest Ref Rng & Units 05/02/2018  FVC-Pre L 1.92  FVC-Predicted Pre % 75  Pre FEV1/FVC % % 55  FEV1-Pre L 1.06  FEV1-Predicted Pre % 52    WALK:  SIX MIN WALK 06/22/2014  Supplimental Oxygen during Test? (L/min) No    Imaging: DG Cervical Spine 2-3 Views  Result Date: 07/28/2020 CLINICAL DATA:  59 year old female with C3-C4 and C4-C5 ACDF. EXAM: CERVICAL SPINE - 2-3 VIEW COMPARISON:  Cervical spine radiograph dated 11/02/2019. FINDINGS: Evaluation is limited on the provided images. Two portable cross-table  lateral views provided. A partially visualized anterior fusion plate and screws noted at C3 and C4 and extending inferiorly to C5. The inferior aspect of the hardware is obscured by the patient's shoulder. An endotracheal and an enteric tube are partially visualized. IMPRESSION: Status post anterior fusion plate and screws of the cervical spine. Electronically Signed   By: [name] M.D.   On: 07/28/2020 16:49    Lab Results:  CBC    Component Value Date/Time   WBC 10.9 (H) 07/19/2020 1113   RBC 4.00 07/19/2020 1113   HGB 11.0 (L) 07/19/2020 1113   HGB 12.8 07/31/2019 1151   HCT 35.7 (L) 07/19/2020 1113   HCT 39.6 07/31/2019 1151   PLT 411 (H) 07/19/2020 1113   PLT 356 07/31/2019 1151   MCV 89.3 07/19/2020 1113   MCV 94 07/31/2019 1151   MCH 27.5 07/19/2020 1113   MCHC 30.8 07/19/2020 1113   RDW 16.1 (H) 07/19/2020 1113   RDW 14.5 07/31/2019 1151   LYMPHSABS 4.0 06/17/2020 1439   LYMPHSABS 3.2 (H) 07/31/2019 1151   MONOABS 1.1 (H) 06/17/2020 1439   EOSABS 0.3 06/17/2020 1439   EOSABS 0.2 07/31/2019 1151   BASOSABS 0.1 06/17/2020 1439   BASOSABS 0.0 07/31/2019 1151    BMET    Component Value Date/Time   NA 138 07/19/2020 1113   NA 144 07/31/2019 1151   K 4.4  07/19/2020 1113   CL 102 07/19/2020 1113   CO2 27 07/19/2020 1113   GLUCOSE 81 07/19/2020 1113   BUN 11 07/19/2020 1113   BUN 11 07/31/2019 1151   CREATININE 0.68 07/19/2020 1113   CREATININE 0.71 02/23/2014 1054   CALCIUM 8.9 07/19/2020 1113   GFRNONAA >60 07/19/2020 1113   GFRNONAA >89 02/23/2014 1054   GFRAA >60 07/19/2020 1113   GFRAA >89 02/23/2014 1054    BNP No results found for: BNP  ProBNP    Component Value Date/Time   PROBNP 15.0 04/19/2017 1636    Specialty Problems      Pulmonary Problems   COPD/asthma     Spirometry 05/02/2018  FEV1 1.06 (52%)  Ratio 55 with mild curvature p trelegy in am but severe symptoms ? Related to upper airway p ET/ neck surgery  - 05/02/2018  After  extensive coaching inhaler device  effectiveness =    75% with symb try 80 2bid sample        Perennial allergic rhinitis   Status asthmaticus      Allergies  Allergen Reactions  . Codeine Nausea And Vomiting  . Sulfa Antibiotics Nausea And Vomiting  . Sulfonamide Derivatives Nausea And Vomiting    REACTION: n/v    Immunization History  Administered Date(s) Administered  . Influenza Split 10/23/2011  . Influenza Whole 09/16/2012  . Influenza,inj,Quad PF,6+ Mos 10/18/2014, 08/31/2015, 09/25/2016, 10/09/2017, 09/23/2018, 08/12/2019  . PFIZER SARS-COV-2 Vaccination 02/24/2020, 03/16/2020  . Pneumococcal Polysaccharide-23 12/02/2015  . Td 04/22/2014    Past Medical History:  Diagnosis Date  . ALLERGIC RHINITIS 05/12/2009   Qualifier: Diagnosis of  By: Lenn Cal Deborra Medina), Wynona Canes    . Anemia, iron deficiency 12/22/2014   pt. denies  . Anginal pain ([place])   . Anxiety   . Arthritis   . Asthma   . ASTHMA 05/12/2009   Severe AFL (Spirometry 05/2009: pre-BD FEV1 0.87L 34% pred, post-BD FEV1 1.11L 44% pred) Volumes hyperinflated Decreased DLCO that does not fully correct to normal range for alveolar volume.     . Bipolar disorder ([place])   . COPD 08/24/2009   Qualifier: Diagnosis of  By: Burnett Kanaris    . COPD (chronic obstructive pulmonary disease) ([place])   . Depression   . Dyspnea    sometimes when exerting self  . Fibromyalgia 05/14/2014  . GERD (gastroesophageal reflux disease)   . Headache   . History of kidney stones   . Hyperlipidemia 04/20/2017  . HYPERTENSION 05/12/2009   Qualifier: Diagnosis of  By: Lenn Cal Deborra Medina), Wynona Canes    . Peripheral vascular disease ([place])   . Pneumonia   . Prediabetes 02/23/2014   pt. denies  . Seizure ([place])   . Seizure ([place])   . Urticaria     Tobacco History: Social History   Tobacco Use  Smoking Status Former Smoker  . Packs/day: 0.25  . Years: 36.00  . Pack years: 9.00  . Types: Cigarettes  . Quit date: 01/21/2019  . Years since  quitting: 1.5  Smokeless Tobacco Never Used  Tobacco Comment   Passive smoker.  Patient states her quit date was 05/09/2018.  Patient educated with resources at today's appointment to continue to support her to stop smoking.   Counseling given: Not Answered Comment: Passive smoker.  Patient states her quit date was 05/09/2018.  Patient educated with resources at today's appointment to continue to support her to stop smoking.   Continue to not smoke  Outpatient Encounter Medications as of 08/11/2020  Medication Sig  . albuterol (PROAIR HFA) 108 (90 Base) MCG/ACT inhaler Inhale 4 puffs into the lungs every 4 (four) hours as needed for wheezing or shortness of breath. (Patient taking differently: Inhale 2 puffs into the lungs every 4 (four) hours as needed for wheezing or shortness of breath. )  . albuterol (PROVENTIL) (2.5 MG/3ML) 0.083% nebulizer solution Take 3 mLs (2.5 mg total) by nebulization every 6 (six) hours as needed for wheezing or shortness of breath. Reported on 06/04/2016  . Biotin 1000 MCG tablet Take 1,000 mcg by mouth daily.  . Budeson-Glycopyrrol-Formoterol (BREZTRI AEROSPHERE) 160-9-4.8 MCG/ACT AERO Inhale 2 puffs into the lungs 2 (two) times daily. Use with spacer.  . carbamazepine (TEGRETOL) 200 MG tablet Take 2 tablets (400 mg total) by mouth 2 (two) times daily.  . Cholecalciferol (VITAMIN D) 125 MCG (5000 UT) CAPS Take 5,000 Units by mouth daily.  . cyclobenzaprine (FLEXERIL) 5 MG tablet Take 1 tablet (5 mg total) by mouth 3 (three) times daily as needed for muscle spasms.  . diclofenac Sodium (VOLTAREN) 1 % GEL Apply 4 g topically 4 (four) times daily. (Patient taking differently: Apply 4 g topically 4 (four) times daily as needed (pain). )  . docusate sodium (COLACE) 100 MG capsule Take 1 capsule (100 mg total) by mouth 2 (two) times daily.  Marland Kitchen doxepin (SINEQUAN) 25 MG capsule Take 1 capsule (25 mg total) by mouth at bedtime as needed (itching).  Marland Kitchen estradiol (ESTRACE) 0.5 MG  tablet TAKE 1 TABLET BY MOUTH DAILY  . famotidine (PEPCID) 20 MG tablet Take 1 tablet (20 mg total) by mouth 2 (two) times daily.  . fluticasone (FLONASE) 50 MCG/ACT nasal spray Place 1 spray into both nostrils 2 (two) times daily. (Patient taking differently: Place 1 spray into both nostrils daily as needed for allergies. )  . hydrochlorothiazide (MICROZIDE) 12.5 MG capsule Take 1 capsule (12.5 mg total) by mouth daily.  . hydrOXYzine (ATARAX/VISTARIL) 50 MG tablet Take 1 tablet (50 mg total) by mouth at bedtime.  . irbesartan (AVAPRO) 75 MG tablet Take 1 tablet (75 mg total) by mouth daily.  Marland Kitchen lactulose, encephalopathy, (GENERLAC) 10 GM/15ML SOLN TAKE 45 TO 90 ML( 30-60 GM) BY MOUTH TWICE DAILY AS NEEDED (Patient taking differently: Take 30-60 g by mouth 2 (two) times daily as needed (constipation). TAKE 45 TO 90 ML( 30-60 GM) BY MOUTH TWICE DAILY AS NEEDED)  . levocetirizine (XYZAL) 5 MG tablet Take 1 tablet (5 mg total) by mouth 2 (two) times daily. (Patient taking differently: Take 5 mg by mouth daily. )  . montelukast (SINGULAIR) 10 MG tablet Take 1 tablet (10 mg total) by mouth at bedtime.  Marland Kitchen oxyCODONE-acetaminophen (PERCOCET/ROXICET) 5-325 MG tablet Take 1-2 tablets by mouth every 4 (four) hours as needed for moderate pain.  . pantoprazole (PROTONIX) 40 MG tablet 1 by mouth once daily (Patient taking differently: Take 40 mg by mouth daily. )  . pravastatin (PRAVACHOL) 40 MG tablet Take 40 mg by mouth daily.  Marland Kitchen triamcinolone cream (KENALOG) 0.1 % APPLY EXTERNALLY TO THE AFFECTED AREA TWICE DAILY (Patient taking differently: Apply 1 application topically daily as needed ([place]). )  . vitamin C (ASCORBIC ACID) 500 MG tablet Take 500 mg by mouth daily.   Marland Kitchen azithromycin (ZITHROMAX) 250 MG tablet 500mg  (two tablets) today, then 250mg  (1 tablet) for the next 4 days  . predniSONE (DELTASONE) 10 MG tablet 4 tabs for 2 days, then 3 tabs for 2 days, 2 tabs for 2 days, then  1 tab for 2 days, then stop  .  [DISCONTINUED] budesonide-formoterol (SYMBICORT) 160-4.5 MCG/ACT inhaler Inhale 2 puffs into the lungs daily.   Facility-Administered Encounter Medications as of 08/11/2020  Medication  . dupilumab (DUPIXENT) prefilled syringe 300 mg     Review of Systems  Review of Systems  Constitutional: Positive for fatigue. Negative for activity change and fever.  HENT: Positive for congestion (green mucous ) and sore throat. Negative for sinus pressure and sinus pain.   Respiratory: Positive for cough, shortness of breath and wheezing.   Cardiovascular: Negative for chest pain and palpitations.  Gastrointestinal: Negative for diarrhea, nausea and vomiting.  Musculoskeletal: Negative for arthralgias.  Neurological: Negative for dizziness.  Psychiatric/Behavioral: Negative for sleep disturbance. The patient is not nervous/anxious.      Physical Exam  BP 122/80 (BP Location: Left Arm, Cuff Size: Normal)   Pulse 84   Temp 98.5 F (36.9 C) (Oral)   Ht 5\' 3"  (1.6 m)   Wt 161 lb (73 kg)   SpO2 96%   BMI 28.52 kg/m   Wt Readings from Last 5 Encounters:  08/11/20 161 lb (73 kg)  07/28/20 157 lb 10.1 oz (71.5 kg)  07/19/20 157 lb 9.6 oz (71.5 kg)  06/17/20 150 lb (68 kg)  06/10/20 166 lb 3.6 oz (75.4 kg)    BMI Readings from Last 5 Encounters:  08/11/20 28.52 kg/m  07/28/20 27.92 kg/m  07/19/20 27.92 kg/m  06/17/20 26.57 kg/m  06/10/20 29.45 kg/m     Physical Exam Vitals and nursing note reviewed.  Constitutional:      General: She is not in acute distress.    Appearance: Normal appearance. She is normal weight.  HENT:     Head: Normocephalic and atraumatic.     Right Ear: External ear normal.     Left Ear: External ear normal.     Mouth/Throat:     Mouth: Mucous membranes are moist.     Pharynx: Oropharynx is clear.  Eyes:     Pupils: Pupils are equal, round, and reactive to light.  Neck:     Comments: S/p neck surgery  Cardiovascular:     Rate and Rhythm: Normal  rate and regular rhythm.     Pulses: Normal pulses.     Heart sounds: Normal heart sounds. No murmur heard.   Pulmonary:     Effort: Pulmonary effort is normal. No respiratory distress.     Breath sounds: No decreased air movement. Wheezing (insp, exp) present. No decreased breath sounds or rales.  Skin:    General: Skin is warm and dry.     Capillary Refill: Capillary refill takes less than 2 seconds.  Neurological:     General: No focal deficit present.     Mental Status: She is alert and oriented to person, place, and time. Mental status is at baseline.     Gait: Gait normal.  Psychiatric:        Mood and Affect: Mood normal.        Behavior: Behavior normal.        Thought Content: Thought content normal.        Judgment: Judgment normal.       Assessment & Plan:   Perennial allergic rhinitis Plan: Continue Singulair Continue Xyzal Continue fluticasone nasal spray Continue Dupixent Continue follow-up with allergy asthma  COPD/asthma Plan: Prednisone taper today Z-Pak today Resume Breztri  Continue not smoke Continue follow-up with allergy asthma Continue rescue inhaler as needed Follow-up in 6  to 8 weeks  Healthcare maintenance Plan: Recommend seasonal flu vaccine when available Recommend pneumonia vaccine after December/2021    Return in about 2 months (around 10/11/2020), or if symptoms worsen or fail to improve, for Follow up with [name].   Lauraine Rinne, NP 08/11/2020   This appointment required 26 minutes of patient care (this includes precharting, chart review, review of results, face-to-face care, etc.).

## 2020-08-11 NOTE — Assessment & Plan Note (Signed)
Plan: Prednisone taper today Z-Pak today Resume Breztri  Continue not smoke Continue follow-up with allergy asthma Continue rescue inhaler as needed Follow-up in 6 to 8 weeks

## 2020-08-11 NOTE — Assessment & Plan Note (Signed)
Plan: Continue Singulair Continue Xyzal Continue fluticasone nasal spray Continue Dupixent Continue follow-up with allergy asthma

## 2020-08-11 NOTE — Addendum Note (Signed)
Addended by: Lorretta Harp on: 08/11/2020 03:29 PM   Modules accepted: Orders

## 2020-08-16 ENCOUNTER — Ambulatory Visit: Payer: Medicare HMO | Admitting: Emergency Medicine

## 2020-08-17 ENCOUNTER — Ambulatory Visit (INDEPENDENT_AMBULATORY_CARE_PROVIDER_SITE_OTHER): Payer: Medicare HMO

## 2020-08-17 ENCOUNTER — Other Ambulatory Visit: Payer: Self-pay

## 2020-08-17 DIAGNOSIS — J309 Allergic rhinitis, unspecified: Secondary | ICD-10-CM | POA: Diagnosis not present

## 2020-08-17 NOTE — Progress Notes (Signed)
Immunotherapy   Patient Details  Name: [name] MRN: [id number] Date of Birth: Sep 29, 1961  08/17/2020  [name] here to start her allergy injection. Patient received 0.05 of her blue vial with DM with an expiration of 07/21/2021. Patient waited in an exam room for 30 minutes with no problem. Following schedule: B Frequency: Weekly Epi-Pen: Yes Consent signed and patient instructions given.   Herbie Drape 08/17/2020, 9:59 AM

## 2020-08-18 ENCOUNTER — Encounter: Payer: Self-pay | Admitting: Allergy & Immunology

## 2020-08-18 ENCOUNTER — Other Ambulatory Visit: Payer: Self-pay

## 2020-08-18 ENCOUNTER — Ambulatory Visit (INDEPENDENT_AMBULATORY_CARE_PROVIDER_SITE_OTHER): Payer: Medicare HMO | Admitting: Allergy & Immunology

## 2020-08-18 VITALS — BP 118/74 | HR 62 | Temp 98.0°F | Resp 16

## 2020-08-18 DIAGNOSIS — J449 Chronic obstructive pulmonary disease, unspecified: Secondary | ICD-10-CM

## 2020-08-18 DIAGNOSIS — J3089 Other allergic rhinitis: Secondary | ICD-10-CM

## 2020-08-18 DIAGNOSIS — B999 Unspecified infectious disease: Secondary | ICD-10-CM

## 2020-08-18 DIAGNOSIS — L508 Other urticaria: Secondary | ICD-10-CM | POA: Diagnosis not present

## 2020-08-18 NOTE — Patient Instructions (Addendum)
1. Asthma-COPD overlap syndrome - Lung testing not done today. - We are not going to make any medication changes at this time.  - Daily controller medication(s): Breztri 169mcg two puffs twice daily with spacer + Dupixent 300 mg every two weeks - Prior to physical activity: Proventil (ORANAGE INHALER) 2 puffs or Ventolin (GRAY INHALER) 2 puffs 10-15 minutes before physical activity. - Rescue medications: Proventil (ORANAGE INHALER) 2 puffs or Ventolin (GRAY INHALER) 2 puffs AND 2 puffs Atrovent (GREEN INHALER) every 4-6 hours as needed - Asthma control goals:  * Full participation in all desired activities (may need albuterol before activity) * Albuterol use two time or less a week on average (not counting use with activity) * Cough interfering with sleep two time or less a month * Oral steroids no more than once a year * No hospitalizations   2. Chronic urticaria - Continue with Xyzal twice daily. - Continue with famotidine twice daily. - Continue with Doxepin 25mg  at night to help with itching.   3. 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 - Continue with allergy shots.   4. Recurrent infections - We will obtain some screening labs to evaluate your immune system.  - Labs to evaluate the quantitative [facility]) aspects of your immune system: IgG/IgA/IgM, CBC with differential - Labs to evaluate the qualitative ([place]) aspects of your immune system: CH50, Pneumococcal titers, Tetanus titers, Diphtheria titers - We may consider immunizations with Pneumovax and Tdap to challenge your immune system, and then obtain repeat titers in 4-6 weeks.    5. Return in about 8 weeks (around 10/13/2020).    Please inform us of any Emergency Department visits, hospitalizations, or changes in symptoms. Call us before going to the ED for breathing or allergy symptoms since we might be able to fit you in for a sick visit. Feel free  to contact us anytime with any questions, problems, or concerns.  It was a pleasure to see you again today!  Websites that have reliable patient information: 1. American Academy of Asthma, Allergy, and Immunology: www.aaaai.org 2. Food Allergy Research and Education (FARE): foodallergy.org 3. Mothers of Asthmatics: http://www.asthmacommunitynetwork.org 4. American College of Allergy, Asthma, and Immunology: www.acaai.org   COVID-19 Vaccine Information can be found at: ShippingScam.co.uk For questions related to vaccine distribution or appointments, please email vaccine@[email or web address] or call [phone].     "Like" [place] on Facebook and Instagram for our latest updates!        Make sure you are registered to vote! If you have moved or changed any of your contact information, you will need to get this updated before voting!  In some cases, you MAY be able to register to vote online: [email or web address]

## 2020-08-18 NOTE — Progress Notes (Signed)
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 chest CT next visit)  Perennial allergic rhinitis (dust mites) - on allergen immunotherapy  Chronic urticaria  Recurrent infections  Plan/Recommendations:   1. Asthma-COPD overlap syndrome - Lung testing not done today. - We are not going to make any medication changes at this time.  - Daily controller medication(s): Breztri 151mcg two puffs twice daily with spacer + Dupixent 300 mg every two weeks - Prior to physical activity: Proventil (ORANAGE INHALER) 2 puffs or Ventolin (GRAY INHALER) 2 puffs 10-15 minutes before physical activity. - Rescue medications: Proventil (ORANAGE INHALER) 2 puffs or Ventolin (GRAY INHALER) 2 puffs AND 2 puffs Atrovent (GREEN INHALER) every 4-6 hours as needed - Asthma control goals:  * Full participation in all desired activities (may need albuterol before activity) * Albuterol use two time or less a week on average (not counting use with activity) * Cough interfering with sleep two time or less a month * Oral steroids no more than once a year * No hospitalizations   2. Chronic urticaria - Continue with Xyzal twice daily. - Continue with famotidine twice daily. - Continue with Doxepin 25mg  at night to help with itching.   3. 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 - Continue with allergy shots.   4. Recurrent infections - We will obtain some screening labs to evaluate your immune system.  - Labs to evaluate the quantitative [facility]) aspects of your immune system: IgG/IgA/IgM, CBC with differential - Labs to evaluate the qualitative ([place]) aspects of your immune system: CH50, Pneumococcal titers, Tetanus titers, Diphtheria titers - We may consider immunizations with Pneumovax and Tdap to challenge your immune system, and  then obtain repeat titers in 4-6 weeks.    5. Return in about 8 weeks (around 10/13/2020).   Subjective:   [name] is a 59 y.o. female presenting today for follow up of  Chief Complaint  Patient presents with  . Asthma    still having wheezing  . Allergic Rhinitis   . Urticaria    [name] has a history of the following: Patient Active Problem List   Diagnosis Date Noted  . Healthcare maintenance 08/11/2020  . Cervical spondylosis with myelopathy and radiculopathy 07/28/2020  . Preop exam for internal medicine 06/19/2020  . Hypotension due to medication 06/11/2020  . Status asthmaticus 06/09/2020  . Leg cramping 08/12/2019  . Hypophosphatemia 01/07/2019  . Vertigo 09/23/2018  . Dysfunction of left eustachian tube 09/23/2018  . Gait disorder 04/21/2018  . Leukocytosis 04/15/2018  . Nausea & vomiting 04/15/2018  . Seizure ([place])   . Cervical radiculopathy 02/18/2018  . 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 Syncope 09/04/2017  . Constipation 09/04/2017  . Nonintractable headache 09/04/2017  . Leg swelling 07/26/2017  . 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 Hyperlipidemia 04/20/2017  . Pedal edema 04/19/2017  . Bipolar I disorder ([place]) 09/19/2016  . Facial pain 07/12/2016  . Rash 06/14/2016  . Migraine 01/25/2016  . Idiopathic urticaria/pruritus 01/23/2016  . Perennial allergic rhinitis 01/23/2016  . Oral candidiasis 01/23/2016  . Greater trochanteric bursitis of both hips 09/08/2015  . Chest pain 06/22/2015  . Itching 06/22/2015  . Bilateral knee pain 06/22/2015  . Bilateral hip pain 06/22/2015  . Anemia, iron deficiency 12/22/2014  . Fatigue 12/21/2014  . Intertrigo 08/07/2014  .  Dyspareunia 07/12/2014  . Menopausal syndrome (hot flushes) 07/12/2014  . History of tobacco abuse 07/12/2014  . Dizziness 06/22/2014  . Weakness 06/22/2014  . Fibromyalgia 05/14/2014  . Encounter for well adult exam with abnormal findings 04/22/2014  .  Recurrent boils 04/22/2014  . Recurrent falls 04/22/2014  . Peripheral vascular disease ([place])   . Depression   . Anxiety   . GERD (gastroesophageal reflux disease)   . Prediabetes 02/23/2014  . Screening mammogram for high-risk patient 02/23/2014  . Back pain 07/22/2013  . COPD/asthma 08/24/2009  . Headache(784.0) 08/24/2009  . HTN (hypertension) 05/12/2009    History obtained from: chart review and patient.  [name] is a 59 y.o. female presenting for a follow up visit.  She was last seen in July 2021.  At that time, her lung testing looks stable.  We stopped her Dulera and started Breztri 2 puffs twice daily.  We did provide her with a sample.  We also continued with Dupixent 30 mg every 2 weeks.  For her urticaria, we continue with Xyzal and famotidine.  We added on doxepin 25 mg at night.  For her rhinitis, would continue with Flonase as well as allergy shots.  We asked her to follow-up in 1 month but she presents today almost 2 months later.  Since last visit, she reports that she has been doing well. She did go to see [name] NP on August 26th. She reported adherence to the [place], but then even during the visit admitted she did not use it that morning.  She was put on a prednisone taper as well as azithromycin.  Smoking cessation was again discussed.  Since the last visit and seeing [name], she has continued to have some problems. She feels better with the azithromycin and the prednisone. She is having some clear mucous. Overall she is doing better. She has had no fevers.   She did have her neck surgery August 12th and she was kept overnight. She tolerated this fine without adverse effect. She goes back on September 7th for a follow up visit.   Asthma/Respiratory Symptom History: She does feel that the [facility]. This was covered by her insurance. She is not sure whether it was more expensive than Dulera. She is doing two in the morning and two at night. She is using her rescue  inhalers just once yesterday after going for a long walk. She remains on Dupixent.   Allergic Rhinitis Symptom History: She started allergy shots without a problem. She is in her [employer]. She endorse eye puffiness and ear fullness in the morning. She had the azithromycin at the end of August 2021. Otherwise she thinks her last course of antibiotics was around 1-2 months before that. These are usually airway/respiratory infections. She does not have deep seated infections. She did have pneumonia once around three years ago (treated inpatient).   She does have reflux and takes a pantroprazole daily for this. This is filled by her PCP.  She does get infections fairly frequently.  These are typically COPD exacerbations or sinus infections.  They almost always involve the airways.  She has never had a bone infection, abscess, or other deep-seated infections.  She did get hospitalized for pneumonia 2 years ago.  Otherwise, there have been no changes to her past medical history, surgical history, family history, or social history.    Review of Systems  Constitutional: Negative.  Negative for chills, fever, malaise/fatigue and weight loss.  HENT: Positive for congestion  and sinus pain. Negative for ear discharge and ear pain.   Eyes: Negative for pain, discharge and redness.  Respiratory: Positive for cough. Negative for sputum production, shortness of breath and wheezing.   Cardiovascular: Negative.  Negative for chest pain and palpitations.  Gastrointestinal: Negative for abdominal pain, constipation, diarrhea, heartburn, nausea and vomiting.  Skin: Negative.  Negative for itching and rash.  Neurological: Negative for dizziness and headaches.  Endo/Heme/Allergies: Positive for environmental allergies. Does not bruise/bleed easily.       Objective:   Blood pressure 118/74, pulse 62, temperature 98 F (36.7 C), temperature source Temporal, resp. rate 16, SpO2 98 %. There is no height or weight  on file to calculate BMI.   Physical Exam:  Physical Exam Constitutional:      Appearance: She is well-developed.     Comments: Appears very comfortable.  Talkative.  HENT:     Head: Normocephalic and atraumatic.     Right Ear: Tympanic membrane, ear canal and external ear normal.     Left Ear: Tympanic membrane, ear canal and external ear normal.     Nose: No nasal deformity, septal deviation, mucosal edema or rhinorrhea.     Right Turbinates: Enlarged and swollen.     Left Turbinates: Enlarged and swollen.     Right Sinus: No maxillary sinus tenderness or frontal sinus tenderness.     Left Sinus: No maxillary sinus tenderness or frontal sinus tenderness.     Mouth/Throat:     Mouth: Mucous membranes are not pale and not dry.     Pharynx: Uvula midline.  Eyes:     General:        Right eye: No discharge.        Left eye: No discharge.     Conjunctiva/sclera: Conjunctivae normal.     Right eye: Right conjunctiva is not injected. No chemosis.    Left eye: Left conjunctiva is not injected. No chemosis.    Pupils: Pupils are equal, round, and reactive to light.  Cardiovascular:     Rate and Rhythm: Normal rate and regular rhythm.     Heart sounds: Normal heart sounds.  Pulmonary:     Effort: Pulmonary effort is normal. No tachypnea, accessory muscle usage or respiratory distress.     Breath sounds: Normal breath sounds. No wheezing, rhonchi or rales.     Comments: Wheezing throughout all lung fields.  No crackles noted.  Somewhat decreased air movement at the bases.  Not tachypneic. Chest:     Chest wall: No tenderness.  Lymphadenopathy:     Cervical: No cervical adenopathy.  Skin:    Coloration: Skin is not pale.     Findings: No abrasion, erythema, petechiae or rash. Rash is not papular, urticarial or vesicular.     Comments: No eczematous or urticarial lesions noted.  Neurological:     Mental Status: She is alert.  Psychiatric:        Behavior: Behavior is cooperative.        Diagnostic studies: labs sent instead     Salvatore Marvel, MD  [facility]

## 2020-08-20 ENCOUNTER — Other Ambulatory Visit: Payer: Self-pay | Admitting: Internal Medicine

## 2020-08-20 ENCOUNTER — Other Ambulatory Visit: Payer: Self-pay | Admitting: Allergy & Immunology

## 2020-08-20 ENCOUNTER — Other Ambulatory Visit: Payer: Self-pay | Admitting: Emergency Medicine

## 2020-08-20 ENCOUNTER — Other Ambulatory Visit: Payer: Self-pay | Admitting: Pulmonary Disease

## 2020-08-20 DIAGNOSIS — J441 Chronic obstructive pulmonary disease with (acute) exacerbation: Secondary | ICD-10-CM

## 2020-08-21 ENCOUNTER — Ambulatory Visit (INDEPENDENT_AMBULATORY_CARE_PROVIDER_SITE_OTHER): Payer: Medicare HMO

## 2020-08-21 ENCOUNTER — Other Ambulatory Visit: Payer: Self-pay

## 2020-08-21 ENCOUNTER — Encounter: Payer: Self-pay | Admitting: *Deleted

## 2020-08-21 ENCOUNTER — Ambulatory Visit
Admission: EM | Admit: 2020-08-21 | Discharge: 2020-08-21 | Disposition: A | Discharge status: Home / Self Care | Payer: Medicare HMO

## 2020-08-21 ENCOUNTER — Other Ambulatory Visit: Payer: Self-pay | Admitting: Family Medicine

## 2020-08-21 DIAGNOSIS — J441 Chronic obstructive pulmonary disease with (acute) exacerbation: Secondary | ICD-10-CM | POA: Diagnosis not present

## 2020-08-21 DIAGNOSIS — R05 Cough: Secondary | ICD-10-CM | POA: Diagnosis not present

## 2020-08-21 DIAGNOSIS — J449 Chronic obstructive pulmonary disease, unspecified: Secondary | ICD-10-CM | POA: Diagnosis not present

## 2020-08-21 DIAGNOSIS — R059 Cough, unspecified: Secondary | ICD-10-CM

## 2020-08-21 HISTORY — DX: Calculus of kidney: N20.0

## 2020-08-21 MED ORDER — DEXAMETHASONE SODIUM PHOSPHATE 10 MG/ML IJ SOLN
10.0000 mg | Freq: Once | INTRAMUSCULAR | Status: AC
  Administered 2020-08-21: 10 mg via INTRAMUSCULAR

## 2020-08-21 MED ORDER — ALBUTEROL SULFATE HFA 108 (90 BASE) MCG/ACT IN AERS
4.0000 | INHALATION_SPRAY | Freq: Once | RESPIRATORY_TRACT | Status: AC
  Administered 2020-08-21: 4 via RESPIRATORY_TRACT

## 2020-08-21 MED ORDER — IPRATROPIUM-ALBUTEROL 0.5-2.5 (3) MG/3ML IN SOLN: 3.0000 mL | Freq: Four times a day (QID) | RESPIRATORY_TRACT | 0 refills | Status: DC | PRN

## 2020-08-21 NOTE — ED Triage Notes (Signed)
C/O cough, wheezing and dyspnea x few wks; states just finished abx and oral steroid 2 wks ago without any improvement.  Saw allergist 4 days ago with no changes to meds.  C/O cough, hoarse voice.  Denies fevers.

## 2020-08-21 NOTE — Discharge Instructions (Signed)
Covid testing ordered, please quarantine until testing results return.  Your chest x-ray was negative for pneumonia, inflammation.  Decadron injection in office today.  Please start DuoNeb as directed, this does have albuterol at night, so prefer albuterol for the next 1 to 2 days.  As your symptoms improved, you can switch DuoNeb back to albuterol as needed.  Please follow-up with your pulmonologist for reevaluation.  If worsening symptoms, fever, worsening shortness of breath, chest pain, go to the emergency department for further evaluation.

## 2020-08-21 NOTE — ED Provider Notes (Signed)
[facility]    CSN: [id number] Arrival date & time: 08/21/20  0846      History   Chief Complaint Chief Complaint  Patient presents with  . Wheezing  . Shortness of Breath    HPI [name] is a 59 y.o. female.   59 year old female with history of COPD, asthma, seizure, HTN, HLD, PVD comes in for few week history of cough, wheezing, shortness of breath.  She was first seen by pulmonology 08/11/2020, and was started on azithromycin and 8-day tapering course of prednisone.  Saw allergist 4 days ago when she was on last dose of prednisone, no changes in medicine. States symptoms has been worsening this whole time without significant relief after prednisone. Continues with cough. Denies fever, loss of taste/smell.      Past Medical History:  Diagnosis Date  . ALLERGIC RHINITIS 05/12/2009   Qualifier: Diagnosis of  By: Lenn Cal Deborra Medina), Wynona Canes    . Anemia, iron deficiency 12/22/2014   pt. denies  . Anginal pain ([place])   . Anxiety   . Arthritis   . Asthma   . ASTHMA 05/12/2009   Severe AFL (Spirometry 05/2009: pre-BD FEV1 0.87L 34% pred, post-BD FEV1 1.11L 44% pred) Volumes hyperinflated Decreased DLCO that does not fully correct to normal range for alveolar volume.     . Bipolar disorder ([place])   . COPD 08/24/2009   Qualifier: Diagnosis of  By: Burnett Kanaris    . COPD (chronic obstructive pulmonary disease) ([place])   . Depression   . Dyspnea    sometimes when exerting self  . Fibromyalgia 05/14/2014  . GERD (gastroesophageal reflux disease)   . Headache   . History of kidney stones   . Hyperlipidemia 04/20/2017  . HYPERTENSION 05/12/2009   Qualifier: Diagnosis of  By: Lenn Cal Deborra Medina), Wynona Canes    . Kidney stones   . Peripheral vascular disease ([place])   . Pneumonia   . Prediabetes 02/23/2014   pt. denies  . Seizure ([place])   . Seizure ([place])   . Urticaria     Patient Active Problem List   Diagnosis Date Noted  . Healthcare maintenance 08/11/2020  .  Cervical spondylosis with myelopathy and radiculopathy 07/28/2020  . Preop exam for internal medicine 06/19/2020  . Hypotension due to medication 06/11/2020  . Status asthmaticus 06/09/2020  . Leg cramping 08/12/2019  . Hypophosphatemia 01/07/2019  . Vertigo 09/23/2018  . Dysfunction of left eustachian tube 09/23/2018  . Gait disorder 04/21/2018  . Leukocytosis 04/15/2018  . Nausea & vomiting 04/15/2018  . Seizure ([place])   . Cervical radiculopathy 02/18/2018  . 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 Syncope 09/04/2017  . Constipation 09/04/2017  . Nonintractable headache 09/04/2017  . Leg swelling 07/26/2017  . 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 Hyperlipidemia 04/20/2017  . Pedal edema 04/19/2017  . Bipolar I disorder ([place]) 09/19/2016  . Facial pain 07/12/2016  . Rash 06/14/2016  . Migraine 01/25/2016  . Idiopathic urticaria/pruritus 01/23/2016  . Perennial allergic rhinitis 01/23/2016  . Oral candidiasis 01/23/2016  . Greater trochanteric bursitis of both hips 09/08/2015  . Chest pain 06/22/2015  . Itching 06/22/2015  . Bilateral knee pain 06/22/2015  . Bilateral hip pain 06/22/2015  . Anemia, iron deficiency 12/22/2014  . Fatigue 12/21/2014  . Intertrigo 08/07/2014  . Dyspareunia 07/12/2014  . Menopausal syndrome (hot flushes) 07/12/2014  . History of tobacco abuse 07/12/2014  . Dizziness 06/22/2014  . Weakness 06/22/2014  . Fibromyalgia 05/14/2014  . Encounter for well  adult exam with abnormal findings 04/22/2014  . Recurrent boils 04/22/2014  . Recurrent falls 04/22/2014  . Peripheral vascular disease ([place])   . Depression   . Anxiety   . GERD (gastroesophageal reflux disease)   . Prediabetes 02/23/2014  . Screening mammogram for high-risk patient 02/23/2014  . Back pain 07/22/2013  . COPD/asthma 08/24/2009  . Headache(784.0) 08/24/2009  . HTN (hypertension) 05/12/2009    Past Surgical History:  Procedure Laterality Date  . ABDOMINAL HYSTERECTOMY     . ANTERIOR CERVICAL DECOMP/DISCECTOMY FUSION N/A 07/28/2020   Procedure: ANTERIOR CERVICAL DECOMPRESSION/DISCECTOMY FUSION. INTERBODY PROTHESIS, PLATE/SCREWS CERVICAL THREE- CERVICAL FOUR, CERVICAL FOUR- CERVICAL FIVE;  Surgeon: Newman Pies, MD;  Location: [facility];  Service: Neurosurgery;  Laterality: N/A;  . BACK SURGERY    . COLONOSCOPY  12/20/2011   Procedure: COLONOSCOPY;  Surgeon: Landry Dyke, MD;  Location: [facility];  Service: Endoscopy;  Laterality: N/A;  . COLONOSCOPY  03/05/2012   Procedure: COLONOSCOPY;  Surgeon: Landry Dyke, MD;  Location: [facility];  Service: Endoscopy;  Laterality: N/A;  . DIAGNOSTIC LAPAROSCOPY    . HEMORRHOID SURGERY    . INCISE AND DRAIN ABCESS    . KIDNEY STONE SURGERY    . NECK SURGERY     x 2 Dr Orinda Kenner  . SPINE SURGERY  2019  . TOE SURGERY    . TUBAL LIGATION      OB History    Gravida  7   Para  3   Term  3   Preterm      AB  4   Living  3     SAB  3   TAB  1   Ectopic      Multiple      Live Births               Home Medications    Prior to Admission medications   Medication Sig Start Date End Date Taking? Authorizing Provider  albuterol (PROAIR HFA) 108 (90 Base) MCG/ACT inhaler Inhale 4 puffs into the lungs every 4 (four) hours as needed for wheezing or shortness of breath. Patient taking differently: Inhale 2 puffs into the lungs every 4 (four) hours as needed for wheezing or shortness of breath.  05/12/20  Yes Valentina Shaggy, MD  albuterol (PROVENTIL) (2.5 MG/3ML) 0.083% nebulizer solution Take 3 mLs (2.5 mg total) by nebulization every 6 (six) hours as needed for wheezing or shortness of breath. Reported on 06/04/2016 08/11/20  Yes Lauraine Rinne, NP  Aspirin-Salicylamide-Caffeine (ARTHRITIS STRENGTH BC POWDER PO) Take by mouth.   Yes [provider]  Biotin 1000 MCG tablet Take 1,000 mcg by mouth daily.   Yes [provider]  Budeson-Glycopyrrol-Formoterol (BREZTRI  AEROSPHERE) 160-9-4.8 MCG/ACT AERO Inhale 2 puffs into the lungs 2 (two) times daily. Use with spacer. 06/28/20  Yes Valentina Shaggy, MD  carbamazepine (TEGRETOL) 200 MG tablet Take 2 tablets (400 mg total) by mouth 2 (two) times daily. 06/01/20  Yes Plovsky, Berneta Sages, MD  Cholecalciferol (VITAMIN D) 125 MCG (5000 UT) CAPS Take 5,000 Units by mouth daily.   Yes [provider]  cyclobenzaprine (FLEXERIL) 5 MG tablet Take 1 tablet (5 mg total) by mouth 3 (three) times daily as needed for muscle spasms. 06/17/20  Yes Biagio Borg, MD  dextromethorphan-guaiFENesin [facility] DM) 30-600 MG 12hr tablet Take 1 tablet by mouth 2 (two) times daily.   Yes [provider]  diclofenac Sodium (VOLTAREN) 1 % GEL Apply  4 g topically 4 (four) times daily. Patient taking differently: Apply 4 g topically 4 (four) times daily as needed (pain).  06/04/20  Yes Deno Etienne, DO  docusate sodium (COLACE) 100 MG capsule Take 1 capsule (100 mg total) by mouth 2 (two) times daily. 07/29/20  Yes Newman Pies, MD  doxepin (SINEQUAN) 25 MG capsule Take 1 capsule (25 mg total) by mouth at bedtime as needed (itching). 06/28/20  Yes Valentina Shaggy, MD  estradiol (ESTRACE) 0.5 MG tablet TAKE 1 TABLET BY MOUTH DAILY 07/26/20  Yes Princess Bruins, MD  famotidine (PEPCID) 20 MG tablet Take 1 tablet (20 mg total) by mouth 2 (two) times daily. 05/12/20  Yes Valentina Shaggy, MD  fluticasone [facility]) 50 MCG/ACT nasal spray Place 1 spray into both nostrils 2 (two) times daily. Patient taking differently: Place 1 spray into both nostrils daily as needed for allergies.  05/12/20  Yes Valentina Shaggy, MD  hydrochlorothiazide (MICROZIDE) 12.5 MG capsule Take 1 capsule (12.5 mg total) by mouth daily. 11/04/19  Yes Biagio Borg, MD  hydrOXYzine (ATARAX/VISTARIL) 50 MG tablet Take 1 tablet (50 mg total) by mouth at bedtime. 06/11/20  Yes Allie Bossier, MD  irbesartan (AVAPRO) 75 MG tablet Take 1 tablet (75 mg  total) by mouth daily. 06/12/20  Yes Allie Bossier, MD  lactulose, encephalopathy, (GENERLAC) 10 GM/15ML SOLN TAKE 45 TO 90 ML( 30-60 GM) BY MOUTH TWICE DAILY AS NEEDED Patient taking differently: Take 30-60 g by mouth 2 (two) times daily as needed (constipation). TAKE 45 TO 90 ML( 30-60 GM) BY MOUTH TWICE DAILY AS NEEDED 03/20/19  Yes Biagio Borg, MD  levocetirizine (XYZAL) 5 MG tablet Take 1 tablet (5 mg total) by mouth 2 (two) times daily. Patient taking differently: Take 5 mg by mouth daily.  05/12/20  Yes Valentina Shaggy, MD  montelukast (SINGULAIR) 10 MG tablet Take 1 tablet (10 mg total) by mouth at bedtime. 03/30/20  Yes Martyn Ehrich, NP  oxyCODONE-acetaminophen (PERCOCET/ROXICET) 5-325 MG tablet Take 1-2 tablets by mouth every 4 (four) hours as needed for moderate pain. 07/29/20  Yes Newman Pies, MD  pantoprazole (PROTONIX) 40 MG tablet 1 by mouth once daily Patient taking differently: Take 40 mg by mouth daily.  03/07/20  Yes Martyn Ehrich, NP  pravastatin (PRAVACHOL) 40 MG tablet Take 40 mg by mouth daily.   Yes [provider]  UNKNOWN TO PATIENT Weekly allergy shots   Yes [provider]  vitamin C (ASCORBIC ACID) 500 MG tablet Take 500 mg by mouth daily.    Yes [provider]  ipratropium-albuterol (DUONEB) 0.5-2.5 (3) MG/3ML SOLN Take 3 mLs by nebulization every 6 (six) hours as needed. 08/21/20   Tasia Catchings, [place] Greeson V, PA-C  triamcinolone cream (KENALOG) 0.1 % APPLY TOPICALLY TO THE AFFECTED AREA TWICE DAILY 08/20/20   Biagio Borg, MD    Family History Family History  Problem Relation Age of Onset  . Diabetes Mother   . COPD Mother   . Heart disease Mother   . Asthma Mother   . Diabetes Father   . Kidney disease Father   . Cancer Father   . Anesthesia problems Father   . Alcohol abuse Father   . Diabetes Sister   . Hypertension Sister   . Diabetes Brother   . Sleep apnea Brother   . Asthma Brother   . Alcohol abuse Brother   . Heart  disease Sister   . Diabetes Sister   .  Brain cancer Sister   . Heart disease Brother   . Asthma Sister   . Allergic rhinitis Neg Hx   . Eczema Neg Hx   . Urticaria Neg Hx     Social History Social History   Tobacco Use  . Smoking status: Former Smoker    Packs/day: 0.25    Years: 36.00    Pack years: 9.00    Types: Cigarettes    Quit date: 01/21/2019    Years since quitting: 1.5  . Smokeless tobacco: Never Used  . Tobacco comment: Passive smoker.  Patient states her quit date was 05/09/2018.  Patient educated with resources at today's appointment to continue to support her to stop smoking.  Vaping Use  . Vaping Use: Never used  Substance Use Topics  . Alcohol use: Not Currently  . Drug use: Yes    Types: Marijuana    Comment: occasionally     Allergies   Codeine, Sulfa antibiotics, and Sulfonamide derivatives   Review of Systems Review of Systems   Physical Exam Triage Vital Signs ED Triage Vitals  Enc Vitals Group     BP 08/21/20 0900 (!) 145/91     Pulse Rate 08/21/20 0900 81     Resp 08/21/20 0900 18     Temp 08/21/20 0900 98.3 F (36.8 C)     Temp Source 08/21/20 0900 Oral     SpO2 08/21/20 0900 95 %     Weight --      Height --      Head Circumference --      Peak Flow --      Pain Score 08/21/20 0911 5     Pain Loc --      Pain Edu? --      Excl. in [place]? --    No data found.  Updated Vital Signs BP (!) 145/91 (BP Location: Left Arm)   Pulse 81   Temp 98.3 F (36.8 C) (Oral)   Resp 18   SpO2 95%   Physical Exam Constitutional:      General: She is not in acute distress.    Appearance: Normal appearance. She is not ill-appearing, toxic-appearing or diaphoretic.  HENT:     Head: Normocephalic and atraumatic.     Mouth/Throat:     Mouth: Mucous membranes are moist.     Pharynx: Oropharynx is clear. Uvula midline.  Cardiovascular:     Rate and Rhythm: Normal rate and regular rhythm.     Heart sounds: Normal heart sounds. No murmur heard.   No friction rub. No gallop.   Pulmonary:     Effort: No prolonged expiration or retractions.     Comments: Slight increase in work of breathing. No acc muscle use, respiratory distress. Audible wheezing. Decreased air movement with faint lung sounds, diffuse inspiratory and expiratory wheezing.   Albuterol 4 puffs x 1: better air movement. Inspiratory and expiratory wheezing throughout right field. Clear on left field  Albuterol 4 puffs x 2: Patient with improved symptoms, continued improvement of air movement.  However, still with inspiratory and expiratory wheezing throughout right field, clear left field.  Musculoskeletal:     Cervical back: Normal range of motion and neck supple.  Neurological:     General: No focal deficit present.     Mental Status: She is alert and oriented to person, place, and time.      UC Treatments / Results  Labs (all labs ordered are listed, but only abnormal results are displayed)  Labs Reviewed  NOVEL CORONAVIRUS, NAA    EKG   Radiology DG Chest 2 View  Result Date: 08/21/2020 CLINICAL DATA:  COPD exacerbation. EXAM: CHEST - 2 VIEW COMPARISON:  06/09/2020; 06/03/2020 FINDINGS: Grossly unchanged cardiac silhouette and mediastinal contours with atherosclerotic plaque within the aortic arch. No focal airspace opacities. No pleural effusion or pneumothorax. No evidence of edema. No acute osseous abnormalities. Post lower cervical ACDF, incompletely evaluated. IMPRESSION: No acute cardiopulmonary disease. Electronically Signed   By: Sandi Mariscal M.D.   On: 08/21/2020 10:02    Procedures Procedures (including critical care time)  Medications Ordered in UC Medications  albuterol (VENTOLIN HFA) 108 (90 Base) MCG/ACT inhaler 4 puff (4 puffs Inhalation Given 08/21/20 0950)  dexamethasone (DECADRON) injection 10 mg (10 mg Intramuscular Given 08/21/20 1038)    Initial Impression / Assessment and Plan / UC Course  I have reviewed the triage vital signs and  the nursing notes.  Pertinent labs & imaging results that were available during my care of the patient were reviewed by me and considered in my medical decision making (see chart for details).    Chest x-ray negative for pneumonia, bronchitis.  Covid testing ordered.  Patient recently finished course of prednisone, will provide Decadron injection in office and defer second course of prednisone for now.  Will have patient use DuoNeb for wheezing.  Strict return precautions given.  Otherwise to follow-up with pulmonologist for reevaluation.  Return precautions given.  Patient expresses understanding and agrees to plan.  Final Clinical Impressions(s) / UC Diagnoses   Final diagnoses:  Cough  COPD exacerbation [facility])    ED Prescriptions    Medication Sig Dispense Auth. Provider   ipratropium-albuterol (DUONEB) 0.5-2.5 (3) MG/3ML SOLN Take 3 mLs by nebulization every 6 (six) hours as needed. 60 mL Ok Edwards, PA-C     PDMP not reviewed this encounter.   Ok Edwards, PA-C 08/21/20 1058

## 2020-08-22 ENCOUNTER — Ambulatory Visit: Payer: Self-pay

## 2020-08-22 ENCOUNTER — Other Ambulatory Visit: Payer: Self-pay | Admitting: Emergency Medicine

## 2020-08-22 LAB — NOVEL CORONAVIRUS, NAA: SARS-CoV-2, NAA: NOT DETECTED

## 2020-08-23 ENCOUNTER — Other Ambulatory Visit: Payer: Self-pay

## 2020-08-23 ENCOUNTER — Ambulatory Visit (INDEPENDENT_AMBULATORY_CARE_PROVIDER_SITE_OTHER): Payer: Medicare HMO

## 2020-08-23 DIAGNOSIS — Z6828 Body mass index (BMI) 28.0-28.9, adult: Secondary | ICD-10-CM | POA: Insufficient documentation

## 2020-08-23 DIAGNOSIS — J309 Allergic rhinitis, unspecified: Secondary | ICD-10-CM

## 2020-08-23 DIAGNOSIS — M4712 Other spondylosis with myelopathy, cervical region: Secondary | ICD-10-CM | POA: Diagnosis not present

## 2020-08-23 DIAGNOSIS — Z6826 Body mass index (BMI) 26.0-26.9, adult: Secondary | ICD-10-CM | POA: Insufficient documentation

## 2020-08-23 DIAGNOSIS — J4540 Moderate persistent asthma, uncomplicated: Secondary | ICD-10-CM

## 2020-08-25 ENCOUNTER — Other Ambulatory Visit: Payer: Self-pay | Admitting: Obstetrics & Gynecology

## 2020-08-29 LAB — IGG, IGA, IGM
IgA/Immunoglobulin A, Serum: 105 mg/dL (ref 87–352)
IgG (Immunoglobin G), Serum: 691 mg/dL (ref 586–1602)
IgM (Immunoglobulin M), Srm: 42 mg/dL (ref 26–217)

## 2020-08-29 LAB — STREP PNEUMONIAE 23 SEROTYPES IGG
Pneumo Ab Type 1*: <0.1 ug/mL — ABNORMAL LOW (ref >1.3)
Pneumo Ab Type 12 (12F)*: <0.1 ug/mL — ABNORMAL LOW (ref >1.3)
Pneumo Ab Type 14*: 0.1 ug/mL — ABNORMAL LOW (ref >1.3)
Pneumo Ab Type 17 (17F)*: 0.1 ug/mL — ABNORMAL LOW (ref >1.3)
Pneumo Ab Type 19 (19F)*: 0.3 ug/mL — ABNORMAL LOW (ref >1.3)
Pneumo Ab Type 2*: 0.3 ug/mL — ABNORMAL LOW (ref >1.3)
Pneumo Ab Type 20*: 0.3 ug/mL — ABNORMAL LOW (ref >1.3)
Pneumo Ab Type 22 (22F)*: 1.1 ug/mL — ABNORMAL LOW (ref >1.3)
Pneumo Ab Type 23 (23F)*: <0.1 ug/mL — ABNORMAL LOW (ref >1.3)
Pneumo Ab Type 26 (6B)*: <0.1 ug/mL — ABNORMAL LOW (ref >1.3)
Pneumo Ab Type 3*: 1.3 ug/mL — ABNORMAL LOW (ref >1.3)
Pneumo Ab Type 34 (10A)*: <0.1 ug/mL — ABNORMAL LOW (ref >1.3)
Pneumo Ab Type 4*: <0.1 ug/mL — ABNORMAL LOW (ref >1.3)
Pneumo Ab Type 43 (11A)*: <0.1 ug/mL — ABNORMAL LOW (ref >1.3)
Pneumo Ab Type 5*: <0.1 ug/mL — ABNORMAL LOW (ref >1.3)
Pneumo Ab Type 51 (7F)*: 2.2 ug/mL (ref >1.3)
Pneumo Ab Type 54 (15B)*: <0.1 ug/mL — ABNORMAL LOW (ref >1.3)
Pneumo Ab Type 56 (18C)*: <0.1 ug/mL — ABNORMAL LOW (ref >1.3)
Pneumo Ab Type 57 (19A)*: 5.6 ug/mL (ref >1.3)
Pneumo Ab Type 68 (9V)*: 0.3 ug/mL — ABNORMAL LOW (ref >1.3)
Pneumo Ab Type 70 (33F)*: <0.1 ug/mL — ABNORMAL LOW (ref >1.3)
Pneumo Ab Type 8*: <0.1 ug/mL — ABNORMAL LOW (ref >1.3)
Pneumo Ab Type 9 (9N)*: <0.1 ug/mL — ABNORMAL LOW (ref >1.3)

## 2020-08-29 LAB — CBC WITH DIFFERENTIAL
Basophils Absolute: 0 10*3/uL (ref 0.0–0.2)
Basos: 0 %
EOS (ABSOLUTE): 0.1 10*3/uL (ref 0.0–0.4)
Eos: 1 %
Hematocrit: 32.7 % — ABNORMAL LOW (ref 34.0–46.6)
Hemoglobin: 10.2 g/dL — ABNORMAL LOW (ref 11.1–15.9)
Immature Grans (Abs): 0.1 10*3/uL (ref 0.0–0.1)
Immature Granulocytes: 1 %
Lymphocytes Absolute: 4.7 10*3/uL — ABNORMAL HIGH (ref 0.7–3.1)
Lymphs: 34 %
MCH: 27.3 pg (ref 26.6–33.0)
MCHC: 31.2 g/dL — ABNORMAL LOW (ref 31.5–35.7)
MCV: 88 fL (ref 79–97)
Monocytes Absolute: 1.2 10*3/uL — ABNORMAL HIGH (ref 0.1–0.9)
Monocytes: 9 %
Neutrophils Absolute: 7.9 10*3/uL — ABNORMAL HIGH (ref 1.4–7.0)
Neutrophils: 55 %
RBC: 3.73 x10E6/uL — ABNORMAL LOW (ref 3.77–5.28)
RDW: 14.4 % (ref 11.7–15.4)
WBC: 14 10*3/uL — ABNORMAL HIGH (ref 3.4–10.8)

## 2020-08-29 LAB — COMPLEMENT, TOTAL: Compl, Total (CH50): >60 U/mL (ref >41)

## 2020-08-29 LAB — DIPHTHERIA / TETANUS ANTIBODY PANEL
Diphtheria Ab: <0.1 IU/mL — ABNORMAL LOW (ref <0.10)
Tetanus Ab, IgG: 1.61 IU/mL (ref <0.10)

## 2020-08-31 ENCOUNTER — Ambulatory Visit (INDEPENDENT_AMBULATORY_CARE_PROVIDER_SITE_OTHER): Payer: Medicare HMO | Admitting: *Deleted

## 2020-08-31 DIAGNOSIS — J309 Allergic rhinitis, unspecified: Secondary | ICD-10-CM

## 2020-09-06 ENCOUNTER — Ambulatory Visit (INDEPENDENT_AMBULATORY_CARE_PROVIDER_SITE_OTHER): Payer: Medicare HMO | Admitting: *Deleted

## 2020-09-06 ENCOUNTER — Other Ambulatory Visit: Payer: Self-pay

## 2020-09-06 DIAGNOSIS — J4540 Moderate persistent asthma, uncomplicated: Secondary | ICD-10-CM

## 2020-09-07 ENCOUNTER — Other Ambulatory Visit: Payer: Self-pay | Admitting: Internal Medicine

## 2020-09-07 NOTE — Telephone Encounter (Signed)
Sent to Dr. John. 

## 2020-09-14 ENCOUNTER — Ambulatory Visit (INDEPENDENT_AMBULATORY_CARE_PROVIDER_SITE_OTHER): Payer: Medicare HMO

## 2020-09-14 DIAGNOSIS — J309 Allergic rhinitis, unspecified: Secondary | ICD-10-CM | POA: Diagnosis not present

## 2020-09-20 ENCOUNTER — Ambulatory Visit: Payer: Self-pay

## 2020-09-20 ENCOUNTER — Telehealth: Payer: Medicare HMO | Admitting: Internal Medicine

## 2020-09-21 ENCOUNTER — Other Ambulatory Visit: Payer: Self-pay

## 2020-09-21 ENCOUNTER — Ambulatory Visit (INDEPENDENT_AMBULATORY_CARE_PROVIDER_SITE_OTHER): Payer: Medicare HMO | Admitting: *Deleted

## 2020-09-21 DIAGNOSIS — J4540 Moderate persistent asthma, uncomplicated: Secondary | ICD-10-CM | POA: Diagnosis not present

## 2020-09-28 ENCOUNTER — Ambulatory Visit (INDEPENDENT_AMBULATORY_CARE_PROVIDER_SITE_OTHER): Payer: Medicare HMO | Admitting: *Deleted

## 2020-09-28 ENCOUNTER — Telehealth (INDEPENDENT_AMBULATORY_CARE_PROVIDER_SITE_OTHER): Payer: Medicare HMO | Admitting: Psychiatry

## 2020-09-28 ENCOUNTER — Other Ambulatory Visit: Payer: Self-pay

## 2020-09-28 ENCOUNTER — Telehealth: Payer: Self-pay | Admitting: Emergency Medicine

## 2020-09-28 DIAGNOSIS — J309 Allergic rhinitis, unspecified: Secondary | ICD-10-CM

## 2020-09-28 DIAGNOSIS — F3130 Bipolar disorder, current episode depressed, mild or moderate severity, unspecified: Secondary | ICD-10-CM | POA: Diagnosis not present

## 2020-09-28 MED ORDER — CARBAMAZEPINE 200 MG PO TABS
400.0000 mg | ORAL_TABLET | Freq: Two times a day (BID) | ORAL | 12 refills | Status: DC
Start: 2020-09-28 — End: 2021-02-01

## 2020-09-28 MED ORDER — HYDROXYZINE HCL 50 MG PO TABS
50.0000 mg | ORAL_TABLET | Freq: Every day | ORAL | 0 refills | Status: DC
Start: 2020-09-28 — End: 2021-02-01

## 2020-09-28 NOTE — Telephone Encounter (Addendum)
Lm on nurses line requesting that [name] returns our office.

## 2020-09-28 NOTE — Telephone Encounter (Signed)
Will call their office back tomorrow and let them know RB response- also ask if the tegretol level is "total" or "free and total"- these are the options that came up when I went to put in order
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
## 2020-09-28 NOTE — Telephone Encounter (Signed)
Yes absolutely

## 2020-09-28 NOTE — Telephone Encounter (Signed)
Spoke with [name] with [facility] who states that patient has appointment tomorrow with our office and that Dr. Norma Fredrickson is asking if we could possibly order labs CMP and Tegretol Level on the patient so that they don't have to send her to a separate location to get them done.   Dr. Lamonte Sakai please advise

## 2020-09-29 ENCOUNTER — Telehealth (INDEPENDENT_AMBULATORY_CARE_PROVIDER_SITE_OTHER): Payer: Medicare HMO | Admitting: Internal Medicine

## 2020-09-29 DIAGNOSIS — J3089 Other allergic rhinitis: Secondary | ICD-10-CM | POA: Diagnosis not present

## 2020-09-29 DIAGNOSIS — J4531 Mild persistent asthma with (acute) exacerbation: Secondary | ICD-10-CM

## 2020-09-29 DIAGNOSIS — R7303 Prediabetes: Secondary | ICD-10-CM | POA: Diagnosis not present

## 2020-09-29 MED ORDER — PREDNISONE 10 MG PO TABS: ORAL_TABLET | ORAL | 0 refills | Status: DC

## 2020-09-29 NOTE — Telephone Encounter (Signed)
Attempted to call [name] with Dr. Casimiro Needle but unable to reach and unable to leave a VM. Will try to call back later. Pt has an appt scheduled with RB 10/18 and we can get the labs done at that visit. Will keep encounter open while we try to call [name] to let her know this info.

## 2020-09-29 NOTE — Patient Instructions (Signed)
Please take all new medication as prescribed - the prednisone  Please also have a COVID test done at [facility] as we discussed  Please continue all other medications as before, and refills have been done if requested.  Please have the pharmacy call with any other refills you may need.  Please keep your appointments with your specialists as you may have planned - Dr Lamonte Sakai on Monday

## 2020-09-29 NOTE — Progress Notes (Signed)
Patient ID: [name], female   DOB: 07/13/1961, 59 y.o.   MRN: [id number]  Virtual Visit via Video Note  I connected with [name] on 09/29/20 at  9:20 AM EDT by a video enabled telemedicine application and verified that I am speaking with the correct person using two identifiers.  Location: of all participants today Patient: at home Provider: at office   I discussed the limitations of evaluation and management by telemedicine and the availability of in person appointments. The patient expressed understanding and agreed to proceed.  History of Present Illness: Here to f/u , Here with 1 mo mild worsenig allergy symptoms and also wheezing sob and doe in the last 3 days despite current tx with dupixent, flonaxe xyzal and inhaler.   Pt denies fever, wt loss, night sweats, loss of appetite, or other constitutional symptoms  Pt denies chest pain, orthopnea, PND, increased LE swelling, palpitations, dizziness or syncope.  Pt denies polydipsia, polyuria, needs form filled out for vocational rehab and questions answered for the form today   Has f/u with pulmonary oct 18 Past Medical History:  Diagnosis Date  . ALLERGIC RHINITIS 05/12/2009   Qualifier: Diagnosis of  By: Lenn Cal Deborra Medina), Wynona Canes    . Anemia, iron deficiency 12/22/2014   pt. denies  . Anginal pain ([place])   . Anxiety   . Arthritis   . Asthma   . ASTHMA 05/12/2009   Severe AFL (Spirometry 05/2009: pre-BD FEV1 0.87L 34% pred, post-BD FEV1 1.11L 44% pred) Volumes hyperinflated Decreased DLCO that does not fully correct to normal range for alveolar volume.     . Bipolar disorder ([place])   . COPD 08/24/2009   Qualifier: Diagnosis of  By: Burnett Kanaris    . COPD (chronic obstructive pulmonary disease) ([place])   . Depression   . Dyspnea    sometimes when exerting self  . Fibromyalgia 05/14/2014  . GERD (gastroesophageal reflux disease)   . Headache   . History of kidney stones   . Hyperlipidemia 04/20/2017  . HYPERTENSION  05/12/2009   Qualifier: Diagnosis of  By: Lenn Cal Deborra Medina), Wynona Canes    . Kidney stones   . Peripheral vascular disease ([place])   . Pneumonia   . Prediabetes 02/23/2014   pt. denies  . Seizure ([place])   . Seizure ([place])   . Urticaria    Past Surgical History:  Procedure Laterality Date  . ABDOMINAL HYSTERECTOMY    . ANTERIOR CERVICAL DECOMP/DISCECTOMY FUSION N/A 07/28/2020   Procedure: ANTERIOR CERVICAL DECOMPRESSION/DISCECTOMY FUSION. INTERBODY PROTHESIS, PLATE/SCREWS CERVICAL THREE- CERVICAL FOUR, CERVICAL FOUR- CERVICAL FIVE;  Surgeon: Newman Pies, MD;  Location: [facility];  Service: Neurosurgery;  Laterality: N/A;  . BACK SURGERY    . COLONOSCOPY  12/20/2011   Procedure: COLONOSCOPY;  Surgeon: Landry Dyke, MD;  [facility];  Service: Endoscopy;  Laterality: N/A;  . COLONOSCOPY  03/05/2012   Procedure: COLONOSCOPY;  Surgeon: Landry Dyke, MD;  [facility];  Service: Endoscopy;  Laterality: N/A;  . DIAGNOSTIC LAPAROSCOPY    . HEMORRHOID SURGERY    . INCISE AND DRAIN ABCESS    . KIDNEY STONE SURGERY    . NECK SURGERY     x 2 Dr Orinda Kenner  . SPINE SURGERY  2019  . TOE SURGERY    . TUBAL LIGATION      reports that she quit smoking about 20 months ago. Her smoking use included cigarettes. She has a 9.00 pack-year smoking history. She has never used  smokeless tobacco. She reports previous alcohol use. She reports current drug use. Drug: Marijuana. family history includes Alcohol abuse in her brother and father; Anesthesia problems in her father; Asthma in her brother, mother, and sister; Brain cancer in her sister; COPD in her mother; Cancer in her father; Diabetes in her brother, father, mother, sister, and sister; Heart disease in her brother, mother, and sister; Hypertension in her sister; Kidney disease in her father; Sleep apnea in her brother. Allergies  Allergen Reactions  . Codeine Nausea And Vomiting  . Sulfa Antibiotics Nausea And Vomiting  .  Sulfonamide Derivatives Nausea And Vomiting    REACTION: n/v   Current Outpatient Medications on File Prior to Visit  Medication Sig Dispense Refill  . cyclobenzaprine (FLEXERIL) 5 MG tablet TAKE 1 TABLET(5 MG) BY MOUTH THREE TIMES DAILY AS NEEDED FOR MUSCLE SPASMS 60 tablet 2  . albuterol (PROAIR HFA) 108 (90 Base) MCG/ACT inhaler Inhale 4 puffs into the lungs every 4 (four) hours as needed for wheezing or shortness of breath. (Patient taking differently: Inhale 2 puffs into the lungs every 4 (four) hours as needed for wheezing or shortness of breath. ) 18 g 2  . albuterol (PROVENTIL) (2.5 MG/3ML) 0.083% nebulizer solution Take 3 mLs (2.5 mg total) by nebulization every 6 (six) hours as needed for wheezing or shortness of breath. Reported on 06/04/2016 120 mL 11  . Aspirin-Salicylamide-Caffeine (ARTHRITIS STRENGTH BC POWDER PO) Take by mouth.    . Biotin 1000 MCG tablet Take 1,000 mcg by mouth daily.    . Budeson-Glycopyrrol-Formoterol (BREZTRI AEROSPHERE) 160-9-4.8 MCG/ACT AERO Inhale 2 puffs into the lungs 2 (two) times daily. Use with spacer. 10.7 g 5  . carbamazepine (TEGRETOL) 200 MG tablet Take 2 tablets (400 mg total) by mouth 2 (two) times daily. 120 tablet 12  . Cholecalciferol (VITAMIN D) 125 MCG (5000 UT) CAPS Take 5,000 Units by mouth daily.    Marland Kitchen dextromethorphan-guaiFENesin (MUCINEX DM) 30-600 MG 12hr tablet Take 1 tablet by mouth 2 (two) times daily.    . diclofenac Sodium (VOLTAREN) 1 % GEL Apply 4 g topically 4 (four) times daily. (Patient taking differently: Apply 4 g topically 4 (four) times daily as needed (pain). ) 100 g 0  . docusate sodium (COLACE) 100 MG capsule Take 1 capsule (100 mg total) by mouth 2 (two) times daily. 60 capsule 0  . doxepin (SINEQUAN) 25 MG capsule Take 1 capsule (25 mg total) by mouth at bedtime as needed (itching). 30 capsule 5  . estradiol (ESTRACE) 0.5 MG tablet TAKE 1 TABLET BY MOUTH DAILY 90 tablet 4  . famotidine (PEPCID) 20 MG tablet TAKE 1  TABLET(20 MG) BY MOUTH TWICE DAILY 64 tablet 5  . fluticasone (FLONASE) 50 MCG/ACT nasal spray Place 1 spray into both nostrils 2 (two) times daily. (Patient taking differently: Place 1 spray into both nostrils daily as needed for allergies. ) 16 g 5  . hydrochlorothiazide (MICROZIDE) 12.5 MG capsule Take 1 capsule (12.5 mg total) by mouth daily. 90 capsule 3  . hydrOXYzine (ATARAX/VISTARIL) 50 MG tablet Take 1 tablet (50 mg total) by mouth at bedtime. 10 tablet 0  . ipratropium-albuterol (DUONEB) 0.5-2.5 (3) MG/3ML SOLN Take 3 mLs by nebulization every 6 (six) hours as needed. 60 mL 0  . irbesartan (AVAPRO) 75 MG tablet Take 1 tablet (75 mg total) by mouth daily. 30 tablet 0  . lactulose, encephalopathy, (GENERLAC) 10 GM/15ML SOLN TAKE 45 TO 90 ML( 30-60 GM) BY MOUTH TWICE DAILY AS NEEDED (  Patient taking differently: Take 30-60 g by mouth 2 (two) times daily as needed (constipation). TAKE 45 TO 90 ML( 30-60 GM) BY MOUTH TWICE DAILY AS NEEDED) 473 mL 0  . levocetirizine (XYZAL) 5 MG tablet TAKE 1 TABLET BY MOUTH EVERY DAY AS NEEDED FOR ALLERGIES 30 tablet 5  . montelukast (SINGULAIR) 10 MG tablet Take 1 tablet (10 mg total) by mouth at bedtime. 30 tablet 6  . oxyCODONE-acetaminophen (PERCOCET/ROXICET) 5-325 MG tablet Take 1-2 tablets by mouth every 4 (four) hours as needed for moderate pain. 30 tablet 0  . pantoprazole (PROTONIX) 40 MG tablet 1 by mouth once daily (Patient taking differently: Take 40 mg by mouth daily. ) 90 tablet 3  . pravastatin (PRAVACHOL) 40 MG tablet Take 40 mg by mouth daily.    Marland Kitchen triamcinolone cream (KENALOG) 0.1 % APPLY TOPICALLY TO THE AFFECTED AREA TWICE DAILY 30 g 0  . UNKNOWN TO PATIENT Weekly allergy shots    . vitamin C (ASCORBIC ACID) 500 MG tablet Take 500 mg by mouth daily.      Current Facility-Administered Medications on File Prior to Visit  Medication Dose Route Frequency Provider Last Rate Last Admin  . dupilumab (DUPIXENT) prefilled syringe 300 mg  300 mg  Subcutaneous Q14 Days Valentina Shaggy, MD   300 mg at 09/21/20 1443    Observations/Objective: Alert, NAD, appropriate mood and affect, resps normal, cn 2-12 intact, moves all 4s, no visible rash or swelling Lab Results  Component Value Date   WBC 14.0 (H) 08/18/2020   HGB 10.2 (L) 08/18/2020   HCT 32.7 (L) 08/18/2020   PLT 411 (H) 07/19/2020   GLUCOSE 81 07/19/2020   CHOL 217 (H) 06/17/2020   TRIG 74.0 06/17/2020   HDL 82.70 06/17/2020   LDLCALC 120 (H) 06/17/2020   ALT 56 (H) 06/17/2020   AST 26 06/17/2020   NA 138 07/19/2020   K 4.4 07/19/2020   CL 102 07/19/2020   CREATININE 0.68 07/19/2020   BUN 11 07/19/2020   CO2 27 07/19/2020   TSH 0.91 06/17/2020   HGBA1C 6.2 06/17/2020   MICROALBUR <0.7 06/17/2020   Assessment and Plan: See notes  Follow Up Instructions: See notes   I discussed the assessment and treatment plan with the patient. The patient was provided an opportunity to ask questions and all were answered. The patient agreed with the plan and demonstrated an understanding of the instructions.   The patient was advised to call back or seek an in-person evaluation if the symptoms worsen or if the condition fails to improve as anticipated.  Cathlean Cower, MD

## 2020-10-02 ENCOUNTER — Encounter: Payer: Self-pay | Admitting: Internal Medicine

## 2020-10-02 DIAGNOSIS — J45901 Unspecified asthma with (acute) exacerbation: Secondary | ICD-10-CM | POA: Insufficient documentation

## 2020-10-02 NOTE — Assessment & Plan Note (Signed)
stable overall by history and exam, recent data reviewed with pt, and pt to continue medical treatment as before,  to f/u any worsening symptoms or concerns  

## 2020-10-02 NOTE — Assessment & Plan Note (Addendum)
For predpac asd, f/u pulm, continue all other tx  I spent 31 minutes in preparing to see the patient by review of recent labs, imaging and procedures, obtaining and reviewing separately obtained history, communicating with the patient and family or caregiver, ordering medications, tests or procedures, and documenting clinical information in the EHR including the differential Dx, treatment, and any further evaluation and other management of asthma exacerabation, allergies, preDM

## 2020-10-02 NOTE — Assessment & Plan Note (Signed)
Mild worsening, for predpac, also encouraged for covid testing as well prior to seeing pulmonary

## 2020-10-03 ENCOUNTER — Ambulatory Visit: Payer: Medicare HMO | Admitting: Emergency Medicine

## 2020-10-04 ENCOUNTER — Other Ambulatory Visit (HOSPITAL_COMMUNITY): Payer: Self-pay | Admitting: *Deleted

## 2020-10-04 DIAGNOSIS — F3130 Bipolar disorder, current episode depressed, mild or moderate severity, unspecified: Secondary | ICD-10-CM

## 2020-10-04 NOTE — Telephone Encounter (Signed)
Spoke with [name] to let her know that patient canceled her appt yesterday with Dr. Lamonte Sakai stating her PCP wanted her to get Covid tested and at this time she does not have a follow up. She expressed understanding. Nothing further needed at this time.

## 2020-10-04 NOTE — Telephone Encounter (Signed)
lmtcb for [name] to make her aware. Pt cancelled appt with RB on 10/18.

## 2020-10-05 ENCOUNTER — Ambulatory Visit (INDEPENDENT_AMBULATORY_CARE_PROVIDER_SITE_OTHER): Payer: Medicare HMO | Admitting: *Deleted

## 2020-10-05 ENCOUNTER — Encounter: Payer: Self-pay | Admitting: Internal Medicine

## 2020-10-05 DIAGNOSIS — J4540 Moderate persistent asthma, uncomplicated: Secondary | ICD-10-CM

## 2020-10-06 ENCOUNTER — Encounter: Payer: Self-pay | Admitting: Emergency Medicine

## 2020-10-06 ENCOUNTER — Other Ambulatory Visit: Payer: Self-pay

## 2020-10-06 ENCOUNTER — Ambulatory Visit (INDEPENDENT_AMBULATORY_CARE_PROVIDER_SITE_OTHER): Payer: Medicare HMO | Admitting: Emergency Medicine

## 2020-10-06 VITALS — BP 140/80 | HR 79 | Temp 97.0°F | Ht 63.0 in | Wt 158.4 lb

## 2020-10-06 DIAGNOSIS — J3089 Other allergic rhinitis: Secondary | ICD-10-CM

## 2020-10-06 DIAGNOSIS — Z23 Encounter for immunization: Secondary | ICD-10-CM

## 2020-10-06 DIAGNOSIS — J441 Chronic obstructive pulmonary disease with (acute) exacerbation: Secondary | ICD-10-CM

## 2020-10-06 LAB — COMPREHENSIVE METABOLIC PANEL
ALT: 17 U/L (ref 0–35)
AST: 16 U/L (ref 0–37)
Albumin: 4 g/dL (ref 3.5–5.2)
Alkaline Phosphatase: 73 U/L (ref 39–117)
BUN: 12 mg/dL (ref 6–23)
CO2: 33 mEq/L — ABNORMAL HIGH (ref 19–32)
Calcium: 9 mg/dL (ref 8.4–10.5)
Chloride: 101 mEq/L (ref 96–112)
Creatinine, Ser: 0.7 mg/dL (ref 0.40–1.20)
GFR: 95.75 mL/min (ref >60.00)
Glucose, Bld: 69 mg/dL — ABNORMAL LOW (ref 70–99)
Potassium: 3.5 mEq/L (ref 3.5–5.1)
Sodium: 141 mEq/L (ref 135–145)
Total Bilirubin: 0.2 mg/dL (ref 0.2–1.2)
Total Protein: 6.5 g/dL (ref 6.0–8.3)

## 2020-10-06 NOTE — Addendum Note (Signed)
Addended by: Suzzanne Cloud E on: 10/06/2020 11:20 AM   Modules accepted: Orders

## 2020-10-06 NOTE — Patient Instructions (Addendum)
Please continue Breztri 2 puffs twice a day.  Rinse and gargle after using. Continue albuterol either 2 puffs or 1 nebulizer treatment if you need it for shortness of breath, chest tightness, wheezing. Continue Dupixent as you have been taking it Continue Singulair, Xyzal, fluticasone nasal spray as you have been using them COVID-19 vaccine up-to-date Flu shot today Follow with Dr Lamonte Sakai in 4 months or sooner if you have any problems.

## 2020-10-06 NOTE — Addendum Note (Signed)
Addended by: Gavin Potters R on: 10/06/2020 11:44 AM   Modules accepted: Orders

## 2020-10-06 NOTE — Addendum Note (Signed)
Addended by: Gavin Potters R on: 10/06/2020 11:19 AM   Modules accepted: Orders

## 2020-10-06 NOTE — Assessment & Plan Note (Signed)
Continue Singulair, Xyzal, fluticasone nasal spray as you have been using them

## 2020-10-06 NOTE — Assessment & Plan Note (Signed)
Please continue Breztri 2 puffs twice a day.  Rinse and gargle after using. Continue albuterol either 2 puffs or 1 nebulizer treatment if you need it for shortness of breath, chest tightness, wheezing. Continue Dupixent as you have been taking it COVID-19 vaccine up-to-date Flu shot today Follow with Dr Lamonte Sakai in 4 months or sooner if you have any problems.

## 2020-10-06 NOTE — Progress Notes (Signed)
Subjective:    Patient ID: [name], female    DOB: [birth year], 59 y.o.   MRN: [id number]  COPD She complains of cough, shortness of breath and wheezing. Associated symptoms include chest pain. Pertinent negatives include no fever, postnasal drip or rhinorrhea. Her past medical history is significant for COPD.    ROV 02/11/18 --59 year old woman, active smoker (rare cigarettes now) with bipolar disorder, allergic rhinitis, COPD/asthma diagnosed around 2000.  Spirometry 05/2009 showed severe obstruction (34% predicted FEV1).  She has been managed with Xolair by her allergist, getting once a month.  I had not seen her since 11/16/16.  She was seen here by Dr. Vaughan Browner on 1/29 with symptoms consistent with an acute exacerbation. Finished abx and pred taper with good improvement. PCP changed her to Trelegy 1 month ago - she prefers it, believes that she has benefited from this.  She is starting to have a lot of nasal congestion and mucous especially in the am, associated with cough. She is on flonase and zyrtec, singulair. Has astelin NS available. Not on xyzal. She is using albuterol about 1x a day.                 ROV 02/17/2019 -- 59 year old woman with severe COPD with an asthmatic component, allergic rhinitis, also with bipolar disease.  Last seen by me about 1 year ago.  She has been on Xolair per her allergist, recently changed to [place] - first does to be today.  She was hospitalized in late January for an acute exacerbation after failing outpatient therapy.  The last time I saw her she had been on Trelegy but she is currently managed on Symbicort and Spiriva. She stopped smoking after that hospitalization!! She has albuterol, uses about 1x a day. Minimal cough. Remains on singulair, xyzal, fluticasone.   ROV 10/06/20 --follow-up visit 59 year old woman with a history of severe persistent allergic rhinitis, associated asthmatic COPD, treated with Dupixent.  She flares frequently with  bronchitic symptoms, recently treated with prednisone and azithromycin in late August after she underwent c-spine sgy. Her cough persisted and she got more pred from Dr Jenny Reichmann middle of this month.  She is on Breztri, reports compliance is good. Her albuterol use has decreased significantly on the breztri. Her cough is resolved (still finishing prednisone). Needs flu shot today. COVID vaccine up to date. Needs the flu shot.     Review of Systems  Constitutional: Positive for fatigue. Negative for chills and fever.  HENT: Negative for postnasal drip, rhinorrhea and sinus pressure.   Respiratory: Positive for cough, shortness of breath and wheezing.   Cardiovascular: Positive for chest pain. Negative for palpitations and leg swelling.       Objective:   Physical Exam Vitals:   10/06/20 1043  BP: 140/80  Pulse: 79  Temp: (!) 97 F (36.1 C)  TempSrc: Temporal  SpO2: 96%  Weight: 158 lb 6.4 oz (71.8 kg)  Height: 5\' 3"  (1.6 m)   Gen: Pleasant, well-nourished, in no distress,  normal affect  ENT: No lesions,  mouth clear,  oropharynx clear, no postnasal drip  Neck: No JVD, no stridor  Lungs: No use of accessory muscles, very distant, no wheezing or abnormal breath.  She does have prolonged expiration and some soft bilateral wheeze on forced expiration.  Cardiovascular: RRR, heart sounds normal, no murmur or gallops, no peripheral edema  Musculoskeletal: No deformities, no cyanosis or clubbing  Neuro: alert, non focal  Skin: Warm, no lesions or rashes  Assessment & Plan:   COPD/asthma Please continue Breztri 2 puffs twice a day.  Rinse and gargle after using. Continue albuterol either 2 puffs or 1 nebulizer treatment if you need it for shortness of breath, chest tightness, wheezing. Continue Dupixent as you have been taking it COVID-19 vaccine up-to-date Flu shot today Follow with Dr Lamonte Sakai in 4 months or sooner if you have any problems.  Perennial allergic  rhinitis Continue Singulair, Xyzal, fluticasone nasal spray as you have been using them  Baltazar Apo, MD, PhD 10/06/2020, 11:01 AM [facility] [phone] or if no answer [phone]

## 2020-10-07 LAB — CARBAMAZEPINE LEVEL, TOTAL: Carbamazepine Lvl: 3.7 mg/L — ABNORMAL LOW (ref 4.0–12.0)

## 2020-10-10 ENCOUNTER — Ambulatory Visit (INDEPENDENT_AMBULATORY_CARE_PROVIDER_SITE_OTHER): Payer: Medicare HMO

## 2020-10-10 DIAGNOSIS — Z1231 Encounter for screening mammogram for malignant neoplasm of breast: Secondary | ICD-10-CM | POA: Diagnosis not present

## 2020-10-10 DIAGNOSIS — J449 Chronic obstructive pulmonary disease, unspecified: Secondary | ICD-10-CM | POA: Diagnosis not present

## 2020-10-10 DIAGNOSIS — Z0000 Encounter for general adult medical examination without abnormal findings: Secondary | ICD-10-CM | POA: Diagnosis not present

## 2020-10-10 DIAGNOSIS — J45909 Unspecified asthma, uncomplicated: Secondary | ICD-10-CM | POA: Diagnosis not present
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
## 2020-10-10 NOTE — Patient Instructions (Addendum)
[name] , Thank you for taking time to come for your Medicare Wellness Visit. I appreciate your ongoing commitment to your health goals. Please review the following plan we discussed and let me know if I can assist you in the future.   Screening recommendations/referrals: Colonoscopy: 02/03/2015; due every 10 years Mammogram: 12/30/2017; due every year; referral placed Bone Density: 02/11/2018 Recommended yearly ophthalmology/optometry visit for glaucoma screening and checkup Recommended yearly dental visit for hygiene and checkup  Vaccinations: Influenza vaccine: 10/06/2020 Pneumococcal vaccine: 12/02/2015 (Pneumovax 23); need Prevnar 13 Tdap vaccine: 04/22/2014; due every 10 years Shingles vaccine: never done  Covid-19:  Up to date  Advanced directives: Advance directive discussed with you today. I have provided a copy for you to complete at home and have notarized. Once this is complete please bring a copy in to our office so we can scan it into your chart.  Conditions/risks identified: Yes; Reviewed health maintenance screenings with patient today and relevant education, vaccines, and/or referrals were provided. Please continue to do your personal lifestyle choices by: daily care of teeth and gums, regular physical activity (goal should be 5 days a week for 30 minutes), eat a healthy diet, avoid tobacco and drug use, limiting any alcohol intake, taking a low-dose aspirin (if not allergic or have been advised by your provider otherwise) and taking vitamins and minerals as recommended by your provider. Continue doing brain stimulating activities (puzzles, reading, adult coloring books, staying active) to keep memory sharp. Continue to eat heart healthy diet (full of fruits, vegetables, whole grains, lean protein, water--limit salt, fat, and sugar intake) and increase physical activity as tolerated.  Next appointment: Please schedule your next Medicare Wellness Visit with your Nurse Health  Advisor in 1 year by calling [phone].  Preventive Care 40-64 Years, Female Preventive care refers to lifestyle choices and visits with your health care provider that can promote health and wellness. What does preventive care include?  A yearly physical exam. This is also called an annual well check.  Dental exams once or twice a year.  Routine eye exams. Ask your health care provider how often you should have your eyes checked.  Personal lifestyle choices, including:  Daily care of your teeth and gums.  Regular physical activity.  Eating a healthy diet.  Avoiding tobacco and drug use.  Limiting alcohol use.  Practicing safe sex.  Taking low-dose aspirin daily starting at age 37.  Taking vitamin and mineral supplements as recommended by your health care provider. What happens during an annual well check? The services and screenings done by your health care provider during your annual well check will depend on your age, overall health, lifestyle risk factors, and family history of disease. Counseling  Your health care provider may ask you questions about your:  Alcohol use.  Tobacco use.  Drug use.  Emotional well-being.  Home and relationship well-being.  Sexual activity.  Eating habits.  Work and work Statistician.  Method of birth control.  Menstrual cycle.  Pregnancy history. Screening  You may have the following tests or measurements:  Height, weight, and BMI.  Blood pressure.  Lipid and cholesterol levels. These may be checked every 5 years, or more frequently if you are over 50 years old.  Skin check.  Lung cancer screening. You may have this screening every year starting at age 36 if you have a 30-pack-year history of smoking and currently smoke or have quit within the past 15 years.  Fecal occult blood test (FOBT) of  the stool. You may have this test every year starting at age 66.  Flexible sigmoidoscopy or colonoscopy. You may have a  sigmoidoscopy every 5 years or a colonoscopy every 10 years starting at age 80.  Hepatitis C blood test.  Hepatitis B blood test.  Sexually transmitted disease (STD) testing.  Diabetes screening. This is done by checking your blood sugar (glucose) after you have not eaten for a while (fasting). You may have this done every 1-3 years.  Mammogram. This may be done every 1-2 years. Talk to your health care provider about when you should start having regular mammograms. This may depend on whether you have a family history of breast cancer.  BRCA-related cancer screening. This may be done if you have a family history of breast, ovarian, tubal, or peritoneal cancers.  Pelvic exam and Pap test. This may be done every 3 years starting at age 54. Starting at age 31, this may be done every 5 years if you have a Pap test in combination with an HPV test.  Bone density scan. This is done to screen for osteoporosis. You may have this scan if you are at high risk for osteoporosis. Discuss your test results, treatment options, and if necessary, the need for more tests with your health care provider. Vaccines  Your health care provider may recommend certain vaccines, such as:  Influenza vaccine. This is recommended every year.  Tetanus, diphtheria, and acellular pertussis (Tdap, Td) vaccine. You may need a Td booster every 10 years.  Zoster vaccine. You may need this after age 38.  Pneumococcal 13-valent conjugate (PCV13) vaccine. You may need this if you have certain conditions and were not previously vaccinated.  Pneumococcal polysaccharide (PPSV23) vaccine. You may need one or two doses if you smoke cigarettes or if you have certain conditions. Talk to your health care provider about which screenings and vaccines you need and how often you need them. This information is not intended to replace advice given to you by your health care provider. Make sure you discuss any questions you have with your  health care provider. Document Released: 12/30/2015 Document Revised: 08/22/2016 Document Reviewed: 10/04/2015 Elsevier Interactive Patient Education  2017 [place] Prevention in the Home Falls can cause injuries. They can happen to people of all ages. There are many things you can do to make your home safe and to help prevent falls. What can I do on the outside of my home?  Regularly fix the edges of walkways and driveways and fix any cracks.  Remove anything that might make you trip as you walk through a door, such as a raised step or threshold.  Trim any bushes or trees on the path to your home.  Use bright outdoor lighting.  Clear any walking paths of anything that might make someone trip, such as rocks or tools.  Regularly check to see if handrails are loose or broken. Make sure that both sides of any steps have handrails.  Any raised decks and porches should have guardrails on the edges.  Have any leaves, snow, or ice cleared regularly.  Use sand or salt on walking paths during winter.  Clean up any spills in your garage right away. This includes oil or grease spills. What can I do in the bathroom?  Use night lights.  Install grab bars by the toilet and in the tub and shower. Do not use towel bars as grab bars.  Use non-skid mats or decals in  the tub or shower.  If you need to sit down in the shower, use a plastic, non-slip stool.  Keep the floor dry. Clean up any water that spills on the floor as soon as it happens.  Remove soap buildup in the tub or shower regularly.  Attach bath mats securely with double-sided non-slip rug tape.  Do not have throw rugs and other things on the floor that can make you trip. What can I do in the bedroom?  Use night lights.  Make sure that you have a light by your bed that is easy to reach.  Do not use any sheets or blankets that are too big for your bed. They should not hang down onto the floor.  Have a firm  chair that has side arms. You can use this for support while you get dressed.  Do not have throw rugs and other things on the floor that can make you trip. What can I do in the kitchen?  Clean up any spills right away.  Avoid walking on wet floors.  Keep items that you use a lot in easy-to-reach places.  If you need to reach something above you, use a strong step stool that has a grab bar.  Keep electrical cords out of the way.  Do not use floor polish or wax that makes floors slippery. If you must use wax, use non-skid floor wax.  Do not have throw rugs and other things on the floor that can make you trip. What can I do with my stairs?  Do not leave any items on the stairs.  Make sure that there are handrails on both sides of the stairs and use them. Fix handrails that are broken or loose. Make sure that handrails are as long as the stairways.  Check any carpeting to make sure that it is firmly attached to the stairs. Fix any carpet that is loose or worn.  Avoid having throw rugs at the top or bottom of the stairs. If you do have throw rugs, attach them to the floor with carpet tape.  Make sure that you have a light switch at the top of the stairs and the bottom of the stairs. If you do not have them, ask someone to add them for you. What else can I do to help prevent falls?  Wear shoes that:  Do not have high heels.  Have rubber bottoms.  Are comfortable and fit you well.  Are closed at the toe. Do not wear sandals.  If you use a stepladder:  Make sure that it is fully opened. Do not climb a closed stepladder.  Make sure that both sides of the stepladder are locked into place.  Ask someone to hold it for you, if possible.  Clearly mark and make sure that you can see:  Any grab bars or handrails.  First and last steps.  Where the edge of each step is.  Use tools that help you move around (mobility aids) if they are needed. These  include:  Canes.  Walkers.  Scooters.  Crutches.  Turn on the lights when you go into a dark area. Replace any light bulbs as soon as they burn out.  Set up your furniture so you have a clear path. Avoid moving your furniture around.  If any of your floors are uneven, fix them.  If there are any pets around you, be aware of where they are.  Review your medicines with your doctor. Some medicines can  make you feel dizzy. This can increase your chance of falling. Ask your doctor what other things that you can do to help prevent falls. This information is not intended to replace advice given to you by your health care provider. Make sure you discuss any questions you have with your health care provider. Document Released: 09/29/2009 Document Revised: 05/10/2016 Document Reviewed: 01/07/2015 Elsevier Interactive Patient Education  2017 [employer].

## 2020-10-12 ENCOUNTER — Ambulatory Visit (INDEPENDENT_AMBULATORY_CARE_PROVIDER_SITE_OTHER): Payer: Medicare HMO | Admitting: *Deleted

## 2020-10-12 DIAGNOSIS — J309 Allergic rhinitis, unspecified: Secondary | ICD-10-CM

## 2020-10-17 ENCOUNTER — Telehealth: Payer: Self-pay | Admitting: Internal Medicine

## 2020-10-17 NOTE — Telephone Encounter (Signed)
Patient is requesting a cough syrup. It can be sent to [facility], [address]    Please call patient if rx is sent in: [phone].

## 2020-10-17 NOTE — Telephone Encounter (Signed)
Patient called again and was wondering if another prescription could be sent in for her blood pressure. She said that the one she is currently on has been recalled. She said that hydrOXYzine (ATARAX/VISTARIL) 50 MG tablet  Is the one that has been recalled.    A new rx can be sent to [id number] - [place], [place]

## 2020-10-18 NOTE — Telephone Encounter (Signed)
Please nicely explain to the pt that there is no need for any change, since the medication she is referring to is:  12 lots only of irbesartan-HCT, which is not the same as she is taking  She is NOT NOT NOT NOT taking the medication that has been recalled      She is taking HCT only (and not the atarax she is referring to)  thanks

## 2020-10-18 NOTE — Telephone Encounter (Signed)
Sent to Dr. John to advise. 

## 2020-10-19 ENCOUNTER — Ambulatory Visit (INDEPENDENT_AMBULATORY_CARE_PROVIDER_SITE_OTHER): Payer: Medicare HMO | Admitting: *Deleted

## 2020-10-19 DIAGNOSIS — J4540 Moderate persistent asthma, uncomplicated: Secondary | ICD-10-CM | POA: Diagnosis not present

## 2020-10-19 NOTE — Telephone Encounter (Signed)
Tried to call pt to inform her of Dr. Gwynn Burly note. However, her phone just rang out then cut off.

## 2020-10-20 ENCOUNTER — Telehealth: Payer: Self-pay | Admitting: Emergency Medicine

## 2020-10-20 MED ORDER — BENZONATATE 200 MG PO CAPS: 200.0000 mg | ORAL_CAPSULE | Freq: Four times a day (QID) | ORAL | 0 refills | Status: DC | PRN

## 2020-10-20 NOTE — Telephone Encounter (Signed)
She has been on frequent prednisone, just finished a course end of October, now recurrent sx.  I believe she will need to be seen to determine contributors to her persistent sx. Set her up with [facility] or APP In meantime, would continue her current inhalers and allergy meds, try adding tessalon perles 200mg  q6h prn for cough.

## 2020-10-20 NOTE — Telephone Encounter (Signed)
Ok with me to go ahead and call it in for her now if she wants to go ahead and start it.

## 2020-10-20 NOTE — Telephone Encounter (Signed)
Called and spoke with patient to let her know of Dr. Sudie Bailey recs she expressed understanding and is now scheduled to see Aaron Edelman tomorrow at 9:30am. Will forward as an FYI  Do I need to send in RX for Ladona Ridgel now or wait until appointment tomorrow morning?

## 2020-10-20 NOTE — Telephone Encounter (Signed)
Called and spoke to pt. Pt c/o chest congestion, prod cough with light green mucus, increase in SOB and wheezing but has noted some relief when using albuterol neb twice a day, sinus drainage, PND, frontal and maxillary sinus pressure, headache, left ear pain x 3 days. Pt was taking Mucinex and robitussin with no relief. Pt deneis CP/tightness, f/c/s, swelling. Pt using flonase. Pt last seen on 10/06/20 by Dr. Lamonte Sakai for asthma, on [address].    Dr. Lamonte Sakai, please advise. Thank you.

## 2020-10-20 NOTE — Telephone Encounter (Signed)
Called to let patient know that RX was being sent to pharmacy. She verified pharmacy, RX sent nothing further needed at this time.

## 2020-10-20 NOTE — Telephone Encounter (Signed)
Noted.   [name]

## 2020-10-21 ENCOUNTER — Ambulatory Visit (INDEPENDENT_AMBULATORY_CARE_PROVIDER_SITE_OTHER): Payer: Medicare HMO | Admitting: Pulmonary Disease

## 2020-10-21 ENCOUNTER — Telehealth: Payer: Self-pay | Admitting: Pulmonary Disease

## 2020-10-21 ENCOUNTER — Other Ambulatory Visit: Payer: Self-pay

## 2020-10-21 ENCOUNTER — Encounter: Payer: Self-pay | Admitting: Pulmonary Disease

## 2020-10-21 VITALS — BP 130/74 | HR 98 | Temp 97.3°F | Ht 63.0 in | Wt 159.4 lb

## 2020-10-21 DIAGNOSIS — Z79899 Other long term (current) drug therapy: Secondary | ICD-10-CM

## 2020-10-21 DIAGNOSIS — J3089 Other allergic rhinitis: Secondary | ICD-10-CM | POA: Diagnosis not present

## 2020-10-21 DIAGNOSIS — Z0000 Encounter for general adult medical examination without abnormal findings: Secondary | ICD-10-CM

## 2020-10-21 DIAGNOSIS — J441 Chronic obstructive pulmonary disease with (acute) exacerbation: Secondary | ICD-10-CM | POA: Diagnosis not present

## 2020-10-21 DIAGNOSIS — K219 Gastro-esophageal reflux disease without esophagitis: Secondary | ICD-10-CM

## 2020-10-21 HISTORY — DX: Other long term (current) drug therapy: Z79.899

## 2020-10-21 MED ORDER — BREZTRI AEROSPHERE 160-9-4.8 MCG/ACT IN AERO: 2.0000 | INHALATION_SPRAY | Freq: Two times a day (BID) | RESPIRATORY_TRACT | 0 refills | Status: DC

## 2020-10-21 NOTE — Progress Notes (Signed)
@Patient  ID: [name], female    DOB: [birth year], 59 y.o.   MRN: [id number]  Chief Complaint  Patient presents with   Follow-up    COPD, Cough, dry cough    Referring provider: Biagio Borg, MD  HPI:  59 year old female former smoker followed in our office for COPD/asthma  PMH: Hypertension, bipolar, prediabetes, GERD, anxiety, depression, hyperlipidemia Smoker/ Smoking History: Former smoker Maintenance:  Librarian, academic, Dupixent  Pt of: Dr. Lamonte Sakai  10/21/2020  - Visit   59 year old female former smoker followed in our office for COPD, asthma, allergic rhinitis.  She is established with Dr. Lamonte Sakai.  Last seen in October/2021.  Plan of care from that office visit was for her to continue using her ICS/LABA/LAMA inhaler, continue albuterol, continue Dupixent, receive her flu shot, follow-up in 4 months, continue Xyzal, Singulair, fluticasone nasal spray.  Patient contacted our office on 10/20/2020 reporting worsening symptoms of productive cough with light green mucus as well as wheezing and shortness of breath.  Due to her being on frequent prednisone it was recommended that patient have a follow-up with our office.  Per chart review recent prednisone prescriptions received by the patient are below:  09/29/2020-PCP-Pred taper 08/21/2020-COPD exacerbation-emergency room visit-Decadron injection 08/11/2020-prednisone taper, Z-Pak 07/20/2020-Pred taper  Patient also established with allergy asthma Dr. Ernst Bowler.  Patient   Patient also recently stopped taking her blood pressure medications because she saw on TV that this caused cancer.  Blood pressure today is stable.  We will discuss this today.  Patient is up-to-date with COVID-19 vaccinations.  She is also received her seasonal flu vaccine for this year.  Patient reports that she has been adherent to receiving her Dupixent and allergy shots from the allergy asthma team of Dr. Gillermina Hu office.  She reports that she last saw Dr.  Ernst Bowler in July/2021.  I reviewed with her that she was due back 4 weeks.  She did not make that appointment.  We will discuss this today.  Patient reports that she is currently off of her ICS/LABA/LAMA inhaler.  She is been out since Monday.  She has not yet to call her pharmacy.  It appears that she has refills on file.  We will discuss this today.  Despite being off of her maintenance inhaler she has not needed to use her rescue Hailer more often.  She reports that ever since starting the triple therapy inhaler she is done better with management of her her rescue inhaler.  Patient denies any flares of acid reflux.  She remains adherent to her allergic rhinitis regimen.  She is not using nasal saline rinses.  She is reporting a nagging dry cough today.  She does report that she stopped her hydrochlorothiazide blood pressure medication because she saw on TV that it may cause cancer.  She reports that her primary care provider is aware that she has stopped this medication.  Her blood pressure is stable today.  Questionaires / Pulmonary Flowsheets:   ACT:  Asthma Control Test ACT Total Score  08/18/2020 20  05/12/2020 14  08/06/2019 21    MMRC: mMRC Dyspnea Scale mMRC Score  08/11/2020 3    [place]:  No flowsheet data found.  Tests:   FENO:  Lab Results  Component Value Date   NITRICOXIDE 19 01/14/2018    PFT: PFT Results Latest Ref Rng & Units 05/02/2018  FVC-Pre L 1.92  FVC-Predicted Pre % 75  Pre FEV1/FVC % % 55  FEV1-Pre L 1.06  FEV1-Predicted Pre %  52    WALK:  SIX MIN WALK 06/22/2014  Supplimental Oxygen during Test? (L/min) No    Imaging: No results found.  Lab Results:  CBC    Component Value Date/Time   WBC 14.0 (H) 08/18/2020 0919   WBC 10.9 (H) 07/19/2020 1113   RBC 3.73 (L) 08/18/2020 0919   RBC 4.00 07/19/2020 1113   HGB 10.2 (L) 08/18/2020 0919   HCT 32.7 (L) 08/18/2020 0919   PLT 411 (H) 07/19/2020 1113   PLT 356 07/31/2019 1151   MCV 88  08/18/2020 0919   MCH 27.3 08/18/2020 0919   MCH 27.5 07/19/2020 1113   MCHC 31.2 (L) 08/18/2020 0919   MCHC 30.8 07/19/2020 1113   RDW 14.4 08/18/2020 0919   LYMPHSABS 4.7 (H) 08/18/2020 0919   MONOABS 1.1 (H) 06/17/2020 1439   EOSABS 0.1 08/18/2020 0919   BASOSABS 0.0 08/18/2020 0919    BMET    Component Value Date/Time   NA 141 10/06/2020 1120   NA 144 07/31/2019 1151   K 3.5 10/06/2020 1120   CL 101 10/06/2020 1120   CO2 33 (H) 10/06/2020 1120   GLUCOSE 69 (L) 10/06/2020 1120   BUN 12 10/06/2020 1120   BUN 11 07/31/2019 1151   CREATININE 0.70 10/06/2020 1120   CREATININE 0.71 02/23/2014 1054   CALCIUM 9.0 10/06/2020 1120   GFRNONAA >60 07/19/2020 1113   GFRNONAA >89 02/23/2014 1054   GFRAA >60 07/19/2020 1113   GFRAA >89 02/23/2014 1054    BNP No results found for: BNP  ProBNP    Component Value Date/Time   PROBNP 15.0 04/19/2017 1636    Specialty Problems      Pulmonary Problems   COPD/asthma     Spirometry 05/02/2018  FEV1 1.06 (52%)  Ratio 55 with mild curvature p trelegy in am but severe symptoms ? Related to upper airway p ET/ neck surgery  - 05/02/2018  After extensive coaching inhaler device  effectiveness =    75% with symb try 80 2bid sample        Perennial allergic rhinitis   Status asthmaticus      Allergies  Allergen Reactions   Codeine Nausea And Vomiting   Sulfa Antibiotics Nausea And Vomiting   Sulfonamide Derivatives Nausea And Vomiting    REACTION: n/v    Immunization History  Administered Date(s) Administered   Influenza Split 10/23/2011   Influenza Whole 09/16/2012   Influenza,inj,Quad PF,6+ Mos 10/18/2014, 08/31/2015, 09/25/2016, 10/09/2017, 09/23/2018, 08/12/2019, 10/06/2020   PFIZER SARS-COV-2 Vaccination 02/24/2020, 03/16/2020   Pneumococcal Polysaccharide-23 12/02/2015   Td 04/22/2014    Past Medical History:  Diagnosis Date   ALLERGIC RHINITIS 05/12/2009   Qualifier: Diagnosis of  By: Lenn Cal Deborra Medina),  [name]     Anemia, iron deficiency 12/22/2014   pt. denies   Anginal pain ([place])    Anxiety    Arthritis    Asthma    ASTHMA 05/12/2009   Severe AFL (Spirometry 05/2009: pre-BD FEV1 0.87L 34% pred, post-BD FEV1 1.11L 44% pred) Volumes hyperinflated Decreased DLCO that does not fully correct to normal range for alveolar volume.      Bipolar disorder ([place])    COPD 08/24/2009   Qualifier: Diagnosis of  By: Burnett Kanaris     COPD (chronic obstructive pulmonary disease) ([place])    Depression    Dyspnea    sometimes when exerting self   Fibromyalgia 05/14/2014   GERD (gastroesophageal reflux disease)    Headache    History of kidney stones  Hyperlipidemia 04/20/2017   HYPERTENSION 05/12/2009   Qualifier: Diagnosis of  By: Lenn Cal Deborra Medina), [name]     Kidney stones    Peripheral vascular disease ([place])    Pneumonia    Prediabetes 02/23/2014   pt. denies   Seizure ([place])    Seizure ([place])    Urticaria     Tobacco History: Social History   Tobacco Use  Smoking Status Former Smoker   Packs/day: 0.25   Years: 36.00   Pack years: 9.00   Types: Cigarettes   Quit date: 01/21/2019   Years since quitting: 1.7  Smokeless Tobacco Never Used  Tobacco Comment   Passive smoker.  Patient states her quit date was 05/09/2018.  Patient educated with resources at today's appointment to continue to support her to stop smoking.   Counseling given: Yes Comment: Passive smoker.  Patient states her quit date was 05/09/2018.  Patient educated with resources at today's appointment to continue to support her to stop smoking.   Continue to not smoke  Outpatient Encounter Medications as of 10/21/2020  Medication Sig   albuterol (PROAIR HFA) 108 (90 Base) MCG/ACT inhaler Inhale 4 puffs into the lungs every 4 (four) hours as needed for wheezing or shortness of breath. (Patient taking differently: Inhale 2 puffs into the lungs every 4 (four) hours as needed for wheezing or shortness  of breath. )   albuterol (PROVENTIL) (2.5 MG/3ML) 0.083% nebulizer solution Take 3 mLs (2.5 mg total) by nebulization every 6 (six) hours as needed for wheezing or shortness of breath. Reported on 04/25/2584   Aspirin-Salicylamide-Caffeine (ARTHRITIS STRENGTH BC POWDER PO) Take by mouth.   benzonatate (TESSALON) 200 MG capsule Take 1 capsule (200 mg total) by mouth every 6 (six) hours as needed for cough.   Biotin 1000 MCG tablet Take 1,000 mcg by mouth daily.   Budeson-Glycopyrrol-Formoterol (BREZTRI AEROSPHERE) 160-9-4.8 MCG/ACT AERO Inhale 2 puffs into the lungs 2 (two) times daily. Use with spacer.   carbamazepine (TEGRETOL) 200 MG tablet Take 2 tablets (400 mg total) by mouth 2 (two) times daily.   Cholecalciferol (VITAMIN D) 125 MCG (5000 UT) CAPS Take 5,000 Units by mouth daily.   cyclobenzaprine (FLEXERIL) 5 MG tablet TAKE 1 TABLET(5 MG) BY MOUTH THREE TIMES DAILY AS NEEDED FOR MUSCLE SPASMS   dextromethorphan-guaiFENesin (MUCINEX DM) 30-600 MG 12hr tablet Take 1 tablet by mouth 2 (two) times daily.   diclofenac Sodium (VOLTAREN) 1 % GEL Apply 4 g topically 4 (four) times daily. (Patient taking differently: Apply 4 g topically 4 (four) times daily as needed (pain). )   docusate sodium (COLACE) 100 MG capsule Take 1 capsule (100 mg total) by mouth 2 (two) times daily.   doxepin (SINEQUAN) 25 MG capsule Take 1 capsule (25 mg total) by mouth at bedtime as needed (itching).   estradiol (ESTRACE) 0.5 MG tablet TAKE 1 TABLET BY MOUTH DAILY   famotidine (PEPCID) 20 MG tablet TAKE 1 TABLET(20 MG) BY MOUTH TWICE DAILY   fluticasone (FLONASE) 50 MCG/ACT nasal spray Place 1 spray into both nostrils 2 (two) times daily. (Patient taking differently: Place 1 spray into both nostrils daily as needed for allergies. )   hydrOXYzine (ATARAX/VISTARIL) 50 MG tablet Take 1 tablet (50 mg total) by mouth at bedtime.   ipratropium-albuterol (DUONEB) 0.5-2.5 (3) MG/3ML SOLN Take 3 mLs by nebulization  every 6 (six) hours as needed.   irbesartan (AVAPRO) 75 MG tablet Take 1 tablet (75 mg total) by mouth daily.   lactulose, encephalopathy, (GENERLAC) 10 GM/15ML  SOLN TAKE 45 TO 90 ML( 30-60 GM) BY MOUTH TWICE DAILY AS NEEDED (Patient taking differently: Take 30-60 g by mouth 2 (two) times daily as needed (constipation). TAKE 45 TO 90 ML( 30-60 GM) BY MOUTH TWICE DAILY AS NEEDED)   levocetirizine (XYZAL) 5 MG tablet TAKE 1 TABLET BY MOUTH EVERY DAY AS NEEDED FOR ALLERGIES   montelukast (SINGULAIR) 10 MG tablet Take 1 tablet (10 mg total) by mouth at bedtime.   oxyCODONE-acetaminophen (PERCOCET/ROXICET) 5-325 MG tablet Take 1-2 tablets by mouth every 4 (four) hours as needed for moderate pain.   pantoprazole (PROTONIX) 40 MG tablet 1 by mouth once daily (Patient taking differently: Take 40 mg by mouth daily. )   pravastatin (PRAVACHOL) 40 MG tablet Take 40 mg by mouth daily.   predniSONE (DELTASONE) 10 MG tablet 3 tabs by mouth per day for 3 days,2tabs per day for 3 days,1tab per day for 3 days   triamcinolone cream (KENALOG) 0.1 % APPLY TOPICALLY TO THE AFFECTED AREA TWICE DAILY   UNKNOWN TO PATIENT Weekly allergy shots   vitamin C (ASCORBIC ACID) 500 MG tablet Take 500 mg by mouth daily.    Budeson-Glycopyrrol-Formoterol (BREZTRI AEROSPHERE) 160-9-4.8 MCG/ACT AERO Inhale 2 puffs into the lungs 2 (two) times daily.   hydrochlorothiazide (MICROZIDE) 12.5 MG capsule Take 1 capsule (12.5 mg total) by mouth daily. (Patient not taking: Reported on 10/21/2020)   Facility-Administered Encounter Medications as of 10/21/2020  Medication   dupilumab (DUPIXENT) prefilled syringe 300 mg     Review of Systems  Review of Systems  Constitutional: Negative for activity change, fatigue and fever.  HENT: Positive for congestion, postnasal drip and rhinorrhea. Negative for sinus pressure, sinus pain and sore throat.   Respiratory: Positive for cough. Negative for shortness of breath and wheezing.    Cardiovascular: Negative for chest pain and palpitations.  Gastrointestinal: Negative for diarrhea, nausea and vomiting.  Musculoskeletal: Negative for arthralgias.  Neurological: Negative for dizziness.  Psychiatric/Behavioral: Negative for sleep disturbance. The patient is not nervous/anxious.      Physical Exam  BP 130/74    Pulse 98    Temp (!) 97.3 F (36.3 C) (Oral)    Ht 5\' 3"  (1.6 m)    Wt 159 lb 6.4 oz (72.3 kg)    SpO2 99%    BMI 28.24 kg/m   Wt Readings from Last 5 Encounters:  10/21/20 159 lb 6.4 oz (72.3 kg)  10/06/20 158 lb 6.4 oz (71.8 kg)  08/11/20 161 lb (73 kg)  07/28/20 157 lb 10.1 oz (71.5 kg)  07/19/20 157 lb 9.6 oz (71.5 kg)    BMI Readings from Last 5 Encounters:  10/21/20 28.24 kg/m  10/06/20 28.06 kg/m  08/11/20 28.52 kg/m  07/28/20 27.92 kg/m  07/19/20 27.92 kg/m     Physical Exam Vitals and nursing note reviewed.  Constitutional:      General: She is not in acute distress.    Appearance: Normal appearance. She is normal weight.  HENT:     Head: Normocephalic and atraumatic.     Right Ear: Tympanic membrane, ear canal and external ear normal. There is no impacted cerumen.     Left Ear: Tympanic membrane, ear canal and external ear normal. There is no impacted cerumen.     Nose: Rhinorrhea present. No congestion.     Mouth/Throat:     Mouth: Mucous membranes are moist.     Pharynx: Oropharynx is clear.     Comments: +PND Eyes:  Pupils: Pupils are equal, round, and reactive to light.  Cardiovascular:     Rate and Rhythm: Normal rate and regular rhythm.     Pulses: Normal pulses.     Heart sounds: Normal heart sounds. No murmur heard.   Pulmonary:     Breath sounds: Normal breath sounds. No decreased air movement. No decreased breath sounds, wheezing or rales.  Musculoskeletal:     Cervical back: Normal range of motion.  Skin:    General: Skin is warm and dry.     Capillary Refill: Capillary refill takes less than 2 seconds.   Neurological:     General: No focal deficit present.     Mental Status: She is alert and oriented to person, place, and time. Mental status is at baseline.     Gait: Gait normal.  Psychiatric:        Mood and Affect: Mood normal.        Behavior: Behavior normal.        Thought Content: Thought content normal.        Judgment: Judgment normal.       Assessment & Plan:   Perennial allergic rhinitis Plan: Continue Singulair Continue Xyzal Start nasal saline rinses Continue fluticasone nasal spray Continue allergy shots Continue Dupixent Schedule follow-up with allergy asthma  COPD/asthma Plan: Restart Breztri, sample provided today  Please contact your pharmacy and obtain a refill as it looks like there are refills on file In the future when you run out of your inhalers you need to contact your pharmacy a few days before to ensure they have this in stock, if there are no refills on file please contact our office or allergy asthma Continue rescue inhaler Continue Singulair Continue Xyzal Continue allergy shots Continue Dupixent Schedule follow-up with allergy asthma I will send a message to your allergist Dr. Ernst Bowler   GERD (gastroesophageal reflux disease) Plan: Continue Protonix Continue Pepcid    Return in about 2 months (around 12/21/2020), or if symptoms worsen or fail to improve, for Follow up with Dr. Lamonte Sakai.   Lauraine Rinne, NP 10/21/2020   This appointment required 34 minutes of patient care (this includes precharting, chart review, review of results, face-to-face care, etc.).

## 2020-10-21 NOTE — Assessment & Plan Note (Signed)
Plan: Continue Protonix Continue Pepcid

## 2020-10-21 NOTE — Assessment & Plan Note (Addendum)
Plan: Continue Singulair Continue Xyzal Start nasal saline rinses Continue fluticasone nasal spray Continue allergy shots Continue Dupixent Schedule follow-up with allergy asthma

## 2020-10-21 NOTE — Assessment & Plan Note (Signed)
Plan: [name], sample provided today  Please contact your pharmacy and obtain a refill as it looks like there are refills on file In the future when you run out of your inhalers you need to contact your pharmacy a few days before to ensure they have this in stock, if there are no refills on file please contact our office or allergy asthma Continue rescue inhaler Continue Singulair Continue Xyzal Continue allergy shots Continue Dupixent Schedule follow-up with allergy asthma I will send a message to your allergist Dr. Ernst Bowler

## 2020-10-21 NOTE — Telephone Encounter (Signed)
Spoke with the pt  She states that the tessalon is not covered by her insurance  She has medicaid and [employer] fast streamer  I called the [facility] and spoke with pharmacy- they do have both insurances on file and med still not covered  Pharm tech states that the pt has already picked up prescription  Called the pt and she states that she did not pick this up  Called pharmacy again was advised this time that the rx is in process and will be ready to pick up today after 5:40 pm The rx will be $22  Pt aware and nothing further needed

## 2020-10-21 NOTE — Patient Instructions (Addendum)
You were seen today by Lauraine Rinne, NP  for:   1. COPD/asthma  Restart Breztri >>> 2 puffs in the morning right when you wake up, rinse out your mouth after use, 12 hours later 2 puffs, rinse after use >>> Take this daily, no matter what >>> This is not a rescue inhaler   Sample provided today  Please contact your pharmacy today to ensure that they are refilling Breztri, appears that you should have refills on file from allergy asthma Dr. Ernst Bowler sent and 5 refills in July/2021  I would encourage you to schedule follow-up with allergy asthma   2. Perennial allergic rhinitis  Continue Singulair  Continue Xyzal  Start nasal saline rinses twice daily Use distilled water Shake well Get bottle lukewarm like a baby bottle  Continue fluticasone/Flonase after nasal saline rinses  3. Medication management  Please notify primary care and schedule an appointment with them regarding your blood pressure medications being stopped due to your concerns of them may be causing cancer  You need to be maintained on your inhalers at all times.  If you run out of your medication you need to notify your pharmacy.  After you notify your pharmacy if there are no refills then you can notify our office as well as allergy asthma.  4. Healthcare maintenance  You have received your COVID-19 vaccinations  I would recommend that you obtain your COVID-19 booster  I would recommend that you schedule follow-up with primary care to discuss your concerns about your blood pressure medications    Follow Up:    Return in about 2 months (around 12/21/2020), or if symptoms worsen or fail to improve, for Follow up with Dr. Lamonte Sakai.   Notification of test results are managed in the following manner: If there are  any recommendations or changes to the  plan of care discussed in office today,  we will contact you and let you know what they are. If you do not hear from [place], then your results are normal and you can  view them through your  MyChart account , or a letter will be sent to you. Thank you again for trusting [place] with your care  - Thank you, [facility]    It is flu season:   >>> Best ways to protect herself from the flu: Receive the yearly flu vaccine, practice good hand hygiene washing with soap and also using hand sanitizer when available, eat a nutritious meals, get adequate rest, hydrate appropriately       Please contact the office if your symptoms worsen or you have concerns that you are not improving.   Thank you for choosing [facility] for your healthcare, and for allowing [place] to partner with you on your healthcare journey. I am thankful to be able to provide care to you today.   [name] FNP-C

## 2020-10-22 ENCOUNTER — Other Ambulatory Visit: Payer: Self-pay | Admitting: Internal Medicine

## 2020-10-22 NOTE — Telephone Encounter (Signed)
Please refill as per office routine med refill policy (all routine meds refilled for 3 mo or monthly per pt preference up to one year from last visit, then month to month grace period for 3 mo, then further med refills will have to be denied)  

## 2020-10-24 ENCOUNTER — Other Ambulatory Visit: Payer: Self-pay | Admitting: Internal Medicine

## 2020-10-27 ENCOUNTER — Ambulatory Visit (INDEPENDENT_AMBULATORY_CARE_PROVIDER_SITE_OTHER): Payer: Medicare HMO

## 2020-10-27 ENCOUNTER — Other Ambulatory Visit: Payer: Self-pay | Admitting: Primary Care

## 2020-10-27 DIAGNOSIS — J309 Allergic rhinitis, unspecified: Secondary | ICD-10-CM | POA: Diagnosis not present

## 2020-10-28 ENCOUNTER — Other Ambulatory Visit: Payer: Self-pay | Admitting: Primary Care

## 2020-10-29 ENCOUNTER — Ambulatory Visit: Payer: Medicare HMO | Attending: Internal Medicine

## 2020-10-29 DIAGNOSIS — Z23 Encounter for immunization: Secondary | ICD-10-CM

## 2020-10-29 NOTE — Progress Notes (Signed)
   Covid-19 Vaccination Clinic  Name:  [name]    MRN: [id number] DOB: September 20, 1961  10/29/2020  [name] was observed post Covid-19 immunization for 15 minutes without incident. She was provided with Vaccine Information Sheet and instruction to access the V-Safe system.   [name] was instructed to call 911 with any severe reactions post vaccine: Marland Kitchen Difficulty breathing  . Swelling of face and throat  . A fast heartbeat  . A bad rash all over body  . Dizziness and weakness   Immunizations Administered    Name Date Dose VIS Date Route   Pfizer COVID-19 Vaccine 10/29/2020 12:13 PM 0.3 mL 10/05/2020 Intramuscular   Manufacturer: [place]   Lot: [id number]   [place]: [id number]

## 2020-11-02 ENCOUNTER — Ambulatory Visit (INDEPENDENT_AMBULATORY_CARE_PROVIDER_SITE_OTHER): Payer: Medicare HMO

## 2020-11-02 DIAGNOSIS — J4540 Moderate persistent asthma, uncomplicated: Secondary | ICD-10-CM | POA: Diagnosis not present

## 2020-11-04 ENCOUNTER — Other Ambulatory Visit: Payer: Self-pay | Admitting: Emergency Medicine

## 2020-11-07 ENCOUNTER — Ambulatory Visit (INDEPENDENT_AMBULATORY_CARE_PROVIDER_SITE_OTHER): Payer: Medicare HMO

## 2020-11-07 DIAGNOSIS — J309 Allergic rhinitis, unspecified: Secondary | ICD-10-CM | POA: Diagnosis not present

## 2020-11-08 NOTE — Telephone Encounter (Signed)
Received  refill request from [facility]  Medication name/strength/dose: tessalon 200 Medication last rx'd: 10/20/2020 Quantity and number of refills last rx'd: 30 with no refills Instructions: 1 cap by mouth every 6 hours as needed for cough  Last OV: 10/21/2020 with BMP Next OV: no pending appts    please advise on refill request  Allergies  Allergen Reactions  . Codeine Nausea And Vomiting  . Sulfa Antibiotics Nausea And Vomiting  . Sulfonamide Derivatives Nausea And Vomiting    REACTION: n/v   Current Outpatient Medications on File Prior to Visit  Medication Sig Dispense Refill  . albuterol (PROAIR HFA) 108 (90 Base) MCG/ACT inhaler Inhale 4 puffs into the lungs every 4 (four) hours as needed for wheezing or shortness of breath. (Patient taking differently: Inhale 2 puffs into the lungs every 4 (four) hours as needed for wheezing or shortness of breath. ) 18 g 2  . albuterol (PROVENTIL) (2.5 MG/3ML) 0.083% nebulizer solution Take 3 mLs (2.5 mg total) by nebulization every 6 (six) hours as needed for wheezing or shortness of breath. Reported on 06/04/2016 120 mL 11  . Aspirin-Salicylamide-Caffeine (ARTHRITIS STRENGTH BC POWDER PO) Take by mouth.    . benzonatate (TESSALON) 200 MG capsule Take 1 capsule (200 mg total) by mouth every 6 (six) hours as needed for cough. 30 capsule 0  . Biotin 1000 MCG tablet Take 1,000 mcg by mouth daily.    . Budeson-Glycopyrrol-Formoterol (BREZTRI AEROSPHERE) 160-9-4.8 MCG/ACT AERO Inhale 2 puffs into the lungs 2 (two) times daily. Use with spacer. 10.7 g 5  . Budeson-Glycopyrrol-Formoterol (BREZTRI AEROSPHERE) 160-9-4.8 MCG/ACT AERO Inhale 2 puffs into the lungs 2 (two) times daily. 4.8 g 0  . carbamazepine (TEGRETOL) 200 MG tablet Take 2 tablets (400 mg total) by mouth 2 (two) times daily. 120 tablet 12  . Cholecalciferol (VITAMIN D) 125 MCG (5000 UT) CAPS Take 5,000 Units by mouth daily.    . cyclobenzaprine (FLEXERIL) 5 MG tablet TAKE 1 TABLET(5 MG) BY  MOUTH THREE TIMES DAILY AS NEEDED FOR MUSCLE SPASMS 60 tablet 2  . dextromethorphan-guaiFENesin (MUCINEX DM) 30-600 MG 12hr tablet Take 1 tablet by mouth 2 (two) times daily.    . diclofenac Sodium (VOLTAREN) 1 % GEL Apply 4 g topically 4 (four) times daily. (Patient taking differently: Apply 4 g topically 4 (four) times daily as needed (pain). ) 100 g 0  . docusate sodium (COLACE) 100 MG capsule Take 1 capsule (100 mg total) by mouth 2 (two) times daily. 60 capsule 0  . doxepin (SINEQUAN) 25 MG capsule Take 1 capsule (25 mg total) by mouth at bedtime as needed (itching). 30 capsule 5  . estradiol (ESTRACE) 0.5 MG tablet TAKE 1 TABLET BY MOUTH DAILY 90 tablet 4  . famotidine (PEPCID) 20 MG tablet TAKE 1 TABLET(20 MG) BY MOUTH TWICE DAILY 64 tablet 5  . fluticasone (FLONASE) 50 MCG/ACT nasal spray SHAKE LIQUID AND USE 2 SPRAYS IN EACH NOSTRIL DAILY 16 g 5  . hydrochlorothiazide (MICROZIDE) 12.5 MG capsule Take 1 capsule (12.5 mg total) by mouth daily. (Patient not taking: Reported on 10/21/2020) 90 capsule 3  . hydrOXYzine (ATARAX/VISTARIL) 50 MG tablet Take 1 tablet (50 mg total) by mouth at bedtime. 10 tablet 0  . ipratropium-albuterol (DUONEB) 0.5-2.5 (3) MG/3ML SOLN Take 3 mLs by nebulization every 6 (six) hours as needed. 60 mL 0  . irbesartan (AVAPRO) 75 MG tablet Take 1 tablet (75 mg total) by mouth daily. 30 tablet 0  . lactulose, encephalopathy, (GENERLAC) 10  GM/15ML SOLN TAKE 45 TO 90 ML( 30-60 GM) BY MOUTH TWICE DAILY AS NEEDED (Patient taking differently: Take 30-60 g by mouth 2 (two) times daily as needed (constipation). TAKE 45 TO 90 ML( 30-60 GM) BY MOUTH TWICE DAILY AS NEEDED) 473 mL 0  . levocetirizine (XYZAL) 5 MG tablet TAKE 1 TABLET BY MOUTH EVERY DAY AS NEEDED FOR ALLERGIES 30 tablet 5  . montelukast (SINGULAIR) 10 MG tablet Take 1 tablet (10 mg total) by mouth at bedtime. 30 tablet 6  . oxyCODONE-acetaminophen (PERCOCET/ROXICET) 5-325 MG tablet Take 1-2 tablets by mouth every 4  (four) hours as needed for moderate pain. 30 tablet 0  . pantoprazole (PROTONIX) 40 MG tablet TAKE 1 TABLET BY MOUTH EVERY DAY 90 tablet 3  . pravastatin (PRAVACHOL) 40 MG tablet TAKE 1 TABLET( 40MG ) BY MOUTH DAILY 90 tablet 1  . predniSONE (DELTASONE) 10 MG tablet 3 tabs by mouth per day for 3 days,2tabs per day for 3 days,1tab per day for 3 days 18 tablet 0  . triamcinolone cream (KENALOG) 0.1 % APPLY TOPICALLY TO THE AFFECTED AREA TWICE DAILY 30 g 0  . UNKNOWN TO PATIENT Weekly allergy shots    . vitamin C (ASCORBIC ACID) 500 MG tablet Take 500 mg by mouth daily.      Current Facility-Administered Medications on File Prior to Visit  Medication Dose Route Frequency Provider Last Rate Last Admin  . dupilumab (DUPIXENT) prefilled syringe 300 mg  300 mg Subcutaneous Q14 Days Valentina Shaggy, MD   300 mg at 11/02/20 1103

## 2020-11-15 ENCOUNTER — Other Ambulatory Visit: Payer: Self-pay

## 2020-11-15 ENCOUNTER — Encounter: Payer: Self-pay | Admitting: Allergy & Immunology

## 2020-11-15 ENCOUNTER — Ambulatory Visit: Payer: Self-pay

## 2020-11-15 ENCOUNTER — Ambulatory Visit (INDEPENDENT_AMBULATORY_CARE_PROVIDER_SITE_OTHER): Payer: Medicare HMO | Admitting: Allergy & Immunology

## 2020-11-15 VITALS — BP 130/86 | HR 75 | Temp 98.2°F | Resp 18 | Ht 61.2 in | Wt 161.0 lb

## 2020-11-15 DIAGNOSIS — B999 Unspecified infectious disease: Secondary | ICD-10-CM

## 2020-11-15 DIAGNOSIS — J449 Chronic obstructive pulmonary disease, unspecified: Secondary | ICD-10-CM | POA: Diagnosis not present

## 2020-11-15 DIAGNOSIS — N760 Acute vaginitis: Secondary | ICD-10-CM | POA: Diagnosis not present

## 2020-11-15 DIAGNOSIS — J309 Allergic rhinitis, unspecified: Secondary | ICD-10-CM

## 2020-11-15 DIAGNOSIS — J3089 Other allergic rhinitis: Secondary | ICD-10-CM | POA: Diagnosis not present

## 2020-11-15 DIAGNOSIS — L508 Other urticaria: Secondary | ICD-10-CM | POA: Diagnosis not present

## 2020-11-15 MED ORDER — FLUCONAZOLE 150 MG PO TABS: 150.0000 mg | ORAL_TABLET | Freq: Every day | ORAL | 0 refills | Status: DC

## 2020-11-15 NOTE — Progress Notes (Signed)
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 chest CT next visit)   Perennial allergic rhinitis(dust mites) - on allergen immunotherapy  Chronic urticaria  Recurrent infections - with largely normal workup aside from non-protective Pneumococcal titers and non-protective Diptheria titers  Plan/Recommendations:   1. Asthma-COPD overlap syndrome - Lung testing not done today. - We are not going to make any medication changes at this time.  - Daily controller medication(s): Breztri 156mcg two puffs twice daily with spacer + Dupixent 300 mg every two weeks - Prior to physical activity: Proventil (ORANAGE INHALER) 2 puffs or Ventolin (GRAY INHALER) 2 puffs 10-15 minutes before physical activity. - Rescue medications: Proventil (ORANAGE INHALER) 2 puffs or Ventolin (GRAY INHALER) 2 puffs AND 2 puffs Atrovent (GREEN INHALER) every 4-6 hours as needed - Asthma control goals:  * Full participation in all desired activities (may need albuterol before activity) * Albuterol use two time or less a week on average (not counting use with activity) * Cough interfering with sleep two time or less a month * Oral steroids no more than once a year * No hospitalizations   2. Chronic urticaria - Continue with Xyzal twice daily. - Continue with famotidine twice daily. - Continue with Doxepin 25mg  at night to help with itching.   3. 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 - Continue with allergy shots.  - Start the prednisone dose pack provided.  4. Vaginal/breast itching - Start fluconazole one tablet week for two weeks. - Call us with an update in two weeks.   5. Return in about 3 months (around 02/13/2021).    Subjective:   [name] is a 59 y.o. female presenting today for follow up of  Chief  Complaint  Patient presents with  . Cough  . Nasal Congestion  . Sinus Problem  . Wheezing    [name] has a history of the following: Patient Active Problem List   Diagnosis Date Noted  . Medication management 10/21/2020  . Healthcare maintenance 08/11/2020  . Cervical spondylosis with myelopathy and radiculopathy 07/28/2020  . Preop exam for internal medicine 06/19/2020  . Hypotension due to medication 06/11/2020  . Status asthmaticus 06/09/2020  . Leg cramping 08/12/2019  . Hypophosphatemia 01/07/2019  . Vertigo 09/23/2018  . Dysfunction of left eustachian tube 09/23/2018  . Gait disorder 04/21/2018  . Leukocytosis 04/15/2018  . Nausea & vomiting 04/15/2018  . Seizure ([place])   . Cervical radiculopathy 02/18/2018  . 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 Syncope 09/04/2017  . Constipation 09/04/2017  . Nonintractable headache 09/04/2017  . Leg swelling 07/26/2017  . 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 Hyperlipidemia 04/20/2017  . Pedal edema 04/19/2017  . Bipolar I disorder ([place]) 09/19/2016  . Facial pain 07/12/2016  . Rash 06/14/2016  . Migraine 01/25/2016  . Idiopathic urticaria/pruritus 01/23/2016  . Perennial allergic rhinitis 01/23/2016  . Oral candidiasis 01/23/2016  . Greater trochanteric bursitis of both hips 09/08/2015  . Chest pain 06/22/2015  . Itching 06/22/2015  . Bilateral knee pain 06/22/2015  . Bilateral hip pain 06/22/2015  . Anemia, iron deficiency 12/22/2014  . Fatigue 12/21/2014  . Intertrigo 08/07/2014  . Dyspareunia 07/12/2014  . Menopausal syndrome (hot flushes) 07/12/2014  . History of tobacco abuse 07/12/2014  . Dizziness 06/22/2014  . Weakness 06/22/2014  . Fibromyalgia 05/14/2014  .  Encounter for well adult exam with abnormal findings 04/22/2014  . Recurrent boils 04/22/2014  . Recurrent falls 04/22/2014  . Peripheral vascular disease ([place])   . Depression   . Anxiety   . GERD (gastroesophageal reflux disease)     . Prediabetes 02/23/2014  . Screening mammogram for high-risk patient 02/23/2014  . Back pain 07/22/2013  . COPD/asthma 08/24/2009  . Headache(784.0) 08/24/2009  . HTN (hypertension) 05/12/2009    History obtained from: chart review and patient.  [name] is a 59 y.o. female presenting for a sick visit.  Since the last visit, she has continued to have issues with a cough. She reports that she had a "terrible cough" that is causing her to "faint". She has not ha da fever. She did have a COVID test that was negative today. She did have her COVID booster.   Labs were notable for protective streptococcal pneumonia titers as well as nonprotective diphtheria titers.  The rest of her labs for her immune screen were all normal.  Asthma/Respiratory Symptom History: She is on her Dupixent every two weeks. She is on her Breztr two puffs in the morning and two puffs at night. Then she does not need anything during the day. She has been having some sinus issues and reports that she "needs help". She is using her rescue inhaler. She does have her albuterol inhaler which she reports that she has not been using too often. The addition of the [place] and Judithann Sauger has helped a lot.  Allergic Rhinitis Symptom History: She is on her allergy shots once weekly. She is due for one today. She also reports that she is waking with "big bags" under her eyes. She is using her nose spray and she "loves" her spray. She is doing well on her shots and has had no problems with it. However, she is continuing to have issues with sinus congestion and postnasal drip as well as throat clearing.  She has been using over-the-counter medications without much relief.  She received some Tessalon Perles from [facility], the [facility], which did not provide much relief.  She denies any sinus pressure and feels that most of her problems are "deeper in [her] lungs".   [name] is on allergen immunotherapy. She receives one injection.  Immunotherapy script #1 contains dust mites. She currently receives 0.62mL of the GREEN vial (1/1,000). Immunotherapy script #2 contains She started shots September of 2020 and not yet reached maintenance.   She also reports some issues with vaginal itching as well as itching on her breast.  This is been going on since she was around her sister's dog.  She thought she needed to tell me just to be thorough.  She is up-to-date on her COVID-19 vaccine, including the booster.  Otherwise, there have been no changes to her past medical history, surgical history, family history, or social history.    Review of Systems  Constitutional: Negative.  Negative for chills, fever, malaise/fatigue and weight loss.  HENT: Positive for congestion and sinus pain. Negative for ear discharge and ear pain.   Eyes: Negative for pain, discharge and redness.  Respiratory: Negative for cough, sputum production, shortness of breath and wheezing.   Cardiovascular: Negative.  Negative for chest pain and palpitations.  Gastrointestinal: Negative for abdominal pain, constipation, diarrhea, heartburn, nausea and vomiting.  Skin: Negative.  Negative for itching and rash.  Neurological: Negative for dizziness and headaches.  Endo/Heme/Allergies: Positive for environmental allergies. Does not bruise/bleed easily.  Objective:   Blood pressure 130/86, pulse 75, temperature 98.2 F (36.8 C), temperature source Temporal, resp. rate 18, height 5' 1.2" (1.554 m), weight 161 lb (73 kg), SpO2 97 %. Body mass index is 30.22 kg/m.   Physical Exam:  Physical Exam Constitutional:      Appearance: She is well-developed.     Comments: Pleasant female. Cooperative with the exam. Bubbly.   HENT:     Head: Normocephalic and atraumatic.     Right Ear: Tympanic membrane, ear canal and external ear normal.     Left Ear: Tympanic membrane, ear canal and external ear normal.     Nose: No nasal deformity, septal deviation,  mucosal edema or rhinorrhea.     Right Turbinates: Enlarged and swollen.     Left Turbinates: Enlarged and swollen.     Right Sinus: No maxillary sinus tenderness or frontal sinus tenderness.     Left Sinus: No maxillary sinus tenderness or frontal sinus tenderness.     Mouth/Throat:     Mouth: Mucous membranes are not pale and not dry.     Pharynx: Uvula midline.  Eyes:     General:        Right eye: No discharge.        Left eye: No discharge.     Conjunctiva/sclera: Conjunctivae normal.     Right eye: Right conjunctiva is not injected. No chemosis.    Left eye: Left conjunctiva is not injected. No chemosis.    Pupils: Pupils are equal, round, and reactive to light.  Cardiovascular:     Rate and Rhythm: Normal rate and regular rhythm.     Heart sounds: Normal heart sounds.  Pulmonary:     Effort: Pulmonary effort is normal. No tachypnea, accessory muscle usage or respiratory distress.     Breath sounds: Normal breath sounds. No wheezing, rhonchi or rales.     Comments: Moving air well in upper lung fields. No increased work of breathing noted. Decreased work of breathing in the posterior fields.  Chest:     Chest wall: No tenderness.  Lymphadenopathy:     Cervical: No cervical adenopathy.  Skin:    Coloration: Skin is not pale.     Findings: No abrasion, erythema, petechiae or rash. Rash is not papular, urticarial or vesicular.     Comments: No eczematous or urticarial lesions noted.   Neurological:     Mental Status: She is alert.      Diagnostic studies: none     Salvatore Marvel, MD  [facility] [place]

## 2020-11-15 NOTE — Patient Instructions (Addendum)
1. Asthma-COPD overlap syndrome - Lung testing not done today. - We are not going to make any medication changes at this time.  - Daily controller medication(s): Breztri 175mcg two puffs twice daily with spacer + Dupixent 300 mg every two weeks - Prior to physical activity: Proventil (ORANAGE INHALER) 2 puffs or Ventolin (GRAY INHALER) 2 puffs 10-15 minutes before physical activity. - Rescue medications: Proventil (ORANAGE INHALER) 2 puffs or Ventolin (GRAY INHALER) 2 puffs AND 2 puffs Atrovent (GREEN INHALER) every 4-6 hours as needed - Asthma control goals:  * Full participation in all desired activities (may need albuterol before activity) * Albuterol use two time or less a week on average (not counting use with activity) * Cough interfering with sleep two time or less a month * Oral steroids no more than once a year * No hospitalizations   2. Chronic urticaria - Continue with Xyzal twice daily. - Continue with famotidine twice daily. - Continue with Doxepin 25mg  at night to help with itching.   3. 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 - Continue with allergy shots.  - Start the prednisone dose pack provided.  4. Vaginal/breast itching - Start fluconazole one tablet week for two weeks. - Call us with an update in two weeks.   5. Return in about 3 months (around 02/13/2021).     Please inform us of any Emergency Department visits, hospitalizations, or changes in symptoms. Call us before going to the ED for breathing or allergy symptoms since we might be able to fit you in for a sick visit. Feel free to contact us anytime with any questions, problems, or concerns.  It was a pleasure to see you again today!  Websites that have reliable patient information: 1. American Academy of Asthma, Allergy, and Immunology: www.aaaai.org 2. Food Allergy Research and Education (FARE): foodallergy.org 3. Mothers of  Asthmatics: http://www.asthmacommunitynetwork.org 4. American College of Allergy, Asthma, and Immunology: www.acaai.org   COVID-19 Vaccine Information can be found at: ShippingScam.co.uk For questions related to vaccine distribution or appointments, please email [email or web address] or call [phone].     "Like" [place] on Facebook and Instagram for our latest updates!        Make sure you are registered to vote! If you have moved or changed any of your contact information, you will need to get this updated before voting!  In some cases, you MAY be able to register to vote online: [email or web address]

## 2020-11-16 DIAGNOSIS — I639 Cerebral infarction, unspecified: Secondary | ICD-10-CM

## 2020-11-16 HISTORY — DX: Cerebral infarction, unspecified: I63.9

## 2020-11-18 ENCOUNTER — Other Ambulatory Visit: Payer: Self-pay | Admitting: Internal Medicine

## 2020-11-18 ENCOUNTER — Other Ambulatory Visit: Payer: Self-pay

## 2020-11-18 ENCOUNTER — Ambulatory Visit (INDEPENDENT_AMBULATORY_CARE_PROVIDER_SITE_OTHER): Payer: Medicare HMO | Admitting: *Deleted

## 2020-11-18 DIAGNOSIS — J4540 Moderate persistent asthma, uncomplicated: Secondary | ICD-10-CM | POA: Diagnosis not present

## 2020-11-18 NOTE — Telephone Encounter (Signed)
Please refill as per office routine med refill policy (all routine meds refilled for 3 mo or monthly per pt preference up to one year from last visit, then month to month grace period for 3 mo, then further med refills will have to be denied)  

## 2020-11-24 ENCOUNTER — Ambulatory Visit (INDEPENDENT_AMBULATORY_CARE_PROVIDER_SITE_OTHER): Payer: Medicare HMO | Admitting: *Deleted

## 2020-11-24 DIAGNOSIS — J309 Allergic rhinitis, unspecified: Secondary | ICD-10-CM

## 2020-12-02 ENCOUNTER — Emergency Department (HOSPITAL_COMMUNITY)
Admission: EM | Admit: 2020-12-02 | Discharge: 2020-12-02 | Disposition: A | Discharge status: Home / Self Care | Payer: Medicare HMO | Attending: Emergency Medicine | Admitting: Emergency Medicine

## 2020-12-02 ENCOUNTER — Telehealth: Payer: Self-pay | Admitting: Internal Medicine

## 2020-12-02 ENCOUNTER — Emergency Department (HOSPITAL_COMMUNITY): Payer: Medicare HMO

## 2020-12-02 ENCOUNTER — Ambulatory Visit (INDEPENDENT_AMBULATORY_CARE_PROVIDER_SITE_OTHER): Payer: Medicare HMO

## 2020-12-02 DIAGNOSIS — R0602 Shortness of breath: Secondary | ICD-10-CM | POA: Insufficient documentation

## 2020-12-02 DIAGNOSIS — R519 Headache, unspecified: Secondary | ICD-10-CM | POA: Diagnosis not present

## 2020-12-02 DIAGNOSIS — J4540 Moderate persistent asthma, uncomplicated: Secondary | ICD-10-CM

## 2020-12-02 DIAGNOSIS — I639 Cerebral infarction, unspecified: Secondary | ICD-10-CM | POA: Diagnosis not present

## 2020-12-02 DIAGNOSIS — J449 Chronic obstructive pulmonary disease, unspecified: Secondary | ICD-10-CM | POA: Insufficient documentation

## 2020-12-02 DIAGNOSIS — I672 Cerebral atherosclerosis: Secondary | ICD-10-CM | POA: Diagnosis not present

## 2020-12-02 DIAGNOSIS — I6782 Cerebral ischemia: Secondary | ICD-10-CM | POA: Diagnosis not present

## 2020-12-02 DIAGNOSIS — I10 Essential (primary) hypertension: Secondary | ICD-10-CM | POA: Diagnosis not present

## 2020-12-02 DIAGNOSIS — I6350 Cerebral infarction due to unspecified occlusion or stenosis of unspecified cerebral artery: Secondary | ICD-10-CM | POA: Diagnosis not present

## 2020-12-02 DIAGNOSIS — R030 Elevated blood-pressure reading, without diagnosis of hypertension: Secondary | ICD-10-CM | POA: Diagnosis not present

## 2020-12-02 DIAGNOSIS — I6529 Occlusion and stenosis of unspecified carotid artery: Secondary | ICD-10-CM | POA: Diagnosis not present

## 2020-12-02 DIAGNOSIS — Z87891 Personal history of nicotine dependence: Secondary | ICD-10-CM | POA: Diagnosis not present

## 2020-12-02 DIAGNOSIS — I6389 Other cerebral infarction: Secondary | ICD-10-CM | POA: Diagnosis not present

## 2020-12-02 DIAGNOSIS — I6381 Other cerebral infarction due to occlusion or stenosis of small artery: Secondary | ICD-10-CM

## 2020-12-02 DIAGNOSIS — R42 Dizziness and giddiness: Secondary | ICD-10-CM | POA: Diagnosis not present

## 2020-12-02 DIAGNOSIS — R930 Abnormal findings on diagnostic imaging of skull and head, not elsewhere classified: Secondary | ICD-10-CM | POA: Diagnosis not present

## 2020-12-02 LAB — CBC
HCT: 33.2 % — ABNORMAL LOW (ref 36.0–46.0)
Hemoglobin: 10.2 g/dL — ABNORMAL LOW (ref 12.0–15.0)
MCH: 24.8 pg — ABNORMAL LOW (ref 26.0–34.0)
MCHC: 30.7 g/dL (ref 30.0–36.0)
MCV: 80.8 fL (ref 80.0–100.0)
Platelets: 376 10*3/uL (ref 150–400)
RBC: 4.11 MIL/uL (ref 3.87–5.11)
RDW: 16.6 % — ABNORMAL HIGH (ref 11.5–15.5)
WBC: 10.3 10*3/uL (ref 4.0–10.5)
nRBC: 0 % (ref 0.0–0.2)

## 2020-12-02 LAB — BASIC METABOLIC PANEL
Anion gap: 10 (ref 5–15)
BUN: 9 mg/dL (ref 6–20)
CO2: 29 mmol/L (ref 22–32)
Calcium: 8.9 mg/dL (ref 8.9–10.3)
Chloride: 101 mmol/L (ref 98–111)
Creatinine, Ser: 0.66 mg/dL (ref 0.44–1.00)
GFR, Estimated: >60 mL/min (ref >60)
Glucose, Bld: 81 mg/dL (ref 70–99)
Potassium: 3.9 mmol/L (ref 3.5–5.1)
Sodium: 140 mmol/L (ref 135–145)

## 2020-12-02 MED ORDER — IPRATROPIUM-ALBUTEROL 0.5-2.5 (3) MG/3ML IN SOLN
3.0000 mL | Freq: Once | RESPIRATORY_TRACT | Status: AC
  Administered 2020-12-02: 18:00:00 3 mL via RESPIRATORY_TRACT
  Filled 2020-12-02: qty 3

## 2020-12-02 NOTE — ED Provider Notes (Signed)
[facility] EMERGENCY DEPARTMENT Provider Note   CSN: [id number] Arrival date & time: 12/02/20  1006     History No chief complaint on file.   [name] is a 59 y.o. female.  HPI   59 y/o female with a h/o allergic rhinitis, anemia, anxiety, asthma, COPD, depression, GERD, nephrolithiasis, pneumonia, seziures, who presents to the ED today for eval of elevated blood pressure.   States she went to her allergy appointment today and her BP was elevated. She was c/o headache/dizziness so she was told to go to the ED.   Additionally, pt states that a few days ago she was sweeping her patio when she got hot, dizzy/lightheaded, shaky and had a syncopal episode. She is unsure if she hit her head. Since then she has had a headache and felt dizzy. Denies vomiting or new numbness/weakness. She denies any other severe injuries for from the fall.   States she had a sore throat a few days ago as well which is now improved. Denies chest pain, pleuritic pain, cough, hemoptysis, recent surgery/trauma, recent long travel, personal hx of cancer, or hx of DVT/PE.  She is on estrogen.  States since she got to the ED she started feeling SOB and wheezing. She did not have these sxs until arriving to the ED. She does note a h/o COPD and states she did not complete her afternoon neb tx as she was here in the waiting room.  Past Medical History:  Diagnosis Date  . ALLERGIC RHINITIS 05/12/2009   Qualifier: Diagnosis of  By: Lenn Cal Deborra Medina), Wynona Canes    . Anemia, iron deficiency 12/22/2014   pt. denies  . Anginal pain ([place])   . Anxiety   . Arthritis   . Asthma   . ASTHMA 05/12/2009   Severe AFL (Spirometry 05/2009: pre-BD FEV1 0.87L 34% pred, post-BD FEV1 1.11L 44% pred) Volumes hyperinflated Decreased DLCO that does not fully correct to normal range for alveolar volume.     . Bipolar disorder ([place])   . COPD 08/24/2009   Qualifier: Diagnosis of  By: Burnett Kanaris    . COPD  (chronic obstructive pulmonary disease) ([place])   . Depression   . Dyspnea    sometimes when exerting self  . Fibromyalgia 05/14/2014  . GERD (gastroesophageal reflux disease)   . Headache   . History of kidney stones   . Hyperlipidemia 04/20/2017  . HYPERTENSION 05/12/2009   Qualifier: Diagnosis of  By: Lenn Cal Deborra Medina), Wynona Canes    . Kidney stones   . Peripheral vascular disease ([place])   . Pneumonia   . Prediabetes 02/23/2014   pt. denies  . Seizure ([place])   . Seizure ([place])   . Urticaria     Patient Active Problem List   Diagnosis Date Noted  . Medication management 10/21/2020  . Healthcare maintenance 08/11/2020  . Cervical spondylosis with myelopathy and radiculopathy 07/28/2020  . Preop exam for internal medicine 06/19/2020  . Hypotension due to medication 06/11/2020  . Status asthmaticus 06/09/2020  . Leg cramping 08/12/2019  . Hypophosphatemia 01/07/2019  . Vertigo 09/23/2018  . Dysfunction of left eustachian tube 09/23/2018  . Gait disorder 04/21/2018  . Leukocytosis 04/15/2018  . Nausea & vomiting 04/15/2018  . Seizure ([place])   . Cervical radiculopathy 02/18/2018  . 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 Syncope 09/04/2017  . Constipation 09/04/2017  . Nonintractable headache 09/04/2017  . Leg swelling 07/26/2017  . 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 Hyperlipidemia 04/20/2017  .  Pedal edema 04/19/2017  . Bipolar I disorder ([place]) 09/19/2016  . Facial pain 07/12/2016  . Rash 06/14/2016  . Migraine 01/25/2016  . Idiopathic urticaria/pruritus 01/23/2016  . Perennial allergic rhinitis 01/23/2016  . Oral candidiasis 01/23/2016  . Greater trochanteric bursitis of both hips 09/08/2015  . Chest pain 06/22/2015  . Itching 06/22/2015  . Bilateral knee pain 06/22/2015  . Bilateral hip pain 06/22/2015  . Anemia, iron deficiency 12/22/2014  . Fatigue 12/21/2014  . Intertrigo 08/07/2014  . Dyspareunia 07/12/2014  . Menopausal syndrome (hot flushes) 07/12/2014  .  History of tobacco abuse 07/12/2014  . Dizziness 06/22/2014  . Weakness 06/22/2014  . Fibromyalgia 05/14/2014  . Encounter for well adult exam with abnormal findings 04/22/2014  . Recurrent boils 04/22/2014  . Recurrent falls 04/22/2014  . Peripheral vascular disease ([place])   . Depression   . Anxiety   . GERD (gastroesophageal reflux disease)   . Prediabetes 02/23/2014  . Screening mammogram for high-risk patient 02/23/2014  . Back pain 07/22/2013  . COPD/asthma 08/24/2009  . Headache(784.0) 08/24/2009  . HTN (hypertension) 05/12/2009    Past Surgical History:  Procedure Laterality Date  . ABDOMINAL HYSTERECTOMY    . ANTERIOR CERVICAL DECOMP/DISCECTOMY FUSION N/A 07/28/2020   Procedure: ANTERIOR CERVICAL DECOMPRESSION/DISCECTOMY FUSION. INTERBODY PROTHESIS, PLATE/SCREWS CERVICAL THREE- CERVICAL FOUR, CERVICAL FOUR- CERVICAL FIVE;  Surgeon: Newman Pies, MD;  Location: [facility];  Service: Neurosurgery;  Laterality: N/A;  . BACK SURGERY    . COLONOSCOPY  12/20/2011   Procedure: COLONOSCOPY;  Surgeon: Landry Dyke, MD;  Location: [facility];  Service: Endoscopy;  Laterality: N/A;  . COLONOSCOPY  03/05/2012   Procedure: COLONOSCOPY;  Surgeon: Landry Dyke, MD;  Location: [facility];  Service: Endoscopy;  Laterality: N/A;  . DIAGNOSTIC LAPAROSCOPY    . HEMORRHOID SURGERY    . INCISE AND DRAIN ABCESS    . KIDNEY STONE SURGERY    . NECK SURGERY     x 2 Dr Orinda Kenner  . SPINE SURGERY  2019  . TOE SURGERY    . TUBAL LIGATION       OB History    Gravida  7   Para  3   Term  3   Preterm      AB  4   Living  3     SAB  3   IAB  1   Ectopic      Multiple      Live Births              Family History  Problem Relation Age of Onset  . Diabetes Mother   . COPD Mother   . Heart disease Mother   . Asthma Mother   . Diabetes Father   . Kidney disease Father   . Cancer Father   . Anesthesia problems Father   . Alcohol abuse Father   . Diabetes Sister    . Hypertension Sister   . Diabetes Brother   . Sleep apnea Brother   . Asthma Brother   . Alcohol abuse Brother   . Heart disease Sister   . Diabetes Sister   . Brain cancer Sister   . Heart disease Brother   . Asthma Sister   . Allergic rhinitis Neg Hx   . Eczema Neg Hx   . Urticaria Neg Hx     Social History   Tobacco Use  . Smoking status: Former Smoker    Packs/day: 0.25    Years: 36.00  Pack years: 9.00    Types: Cigarettes    Quit date: 01/21/2019    Years since quitting: 1.8  . Smokeless tobacco: Never Used  . Tobacco comment: Passive smoker.  Patient states her quit date was 05/09/2018.  Patient educated with resources at today's appointment to continue to support her to stop smoking.  Vaping Use  . Vaping Use: Never used  Substance Use Topics  . Alcohol use: Not Currently  . Drug use: Yes    Types: Marijuana    Comment: occasionally    Home Medications Prior to Admission medications   Medication Sig Start Date End Date Taking? Authorizing Provider  albuterol (PROAIR HFA) 108 (90 Base) MCG/ACT inhaler Inhale 4 puffs into the lungs every 4 (four) hours as needed for wheezing or shortness of breath. Patient taking differently: Inhale 2 puffs into the lungs every 4 (four) hours as needed for wheezing or shortness of breath.  05/12/20   Valentina Shaggy, MD  albuterol (PROVENTIL) (2.5 MG/3ML) 0.083% nebulizer solution Take 3 mLs (2.5 mg total) by nebulization every 6 (six) hours as needed for wheezing or shortness of breath. Reported on 06/04/2016 08/11/20   Lauraine Rinne, NP  Biotin 1000 MCG tablet Take 1,000 mcg by mouth daily.    [provider]  Budeson-Glycopyrrol-Formoterol (BREZTRI AEROSPHERE) 160-9-4.8 MCG/ACT AERO Inhale 2 puffs into the lungs 2 (two) times daily. Use with spacer. 06/28/20   Valentina Shaggy, MD  Budeson-Glycopyrrol-Formoterol (BREZTRI AEROSPHERE) 160-9-4.8 MCG/ACT AERO Inhale 2 puffs into the lungs 2 (two) times daily. 10/21/20    Lauraine Rinne, NP  carbamazepine (TEGRETOL) 200 MG tablet Take 2 tablets (400 mg total) by mouth 2 (two) times daily. 09/28/20   Plovsky, Berneta Sages, MD  Cholecalciferol (VITAMIN D) 125 MCG (5000 UT) CAPS Take 5,000 Units by mouth daily.    [provider]  cyclobenzaprine (FLEXERIL) 5 MG tablet TAKE 1 TABLET(5 MG) BY MOUTH THREE TIMES DAILY AS NEEDED FOR MUSCLE SPASMS 09/07/20   Biagio Borg, MD  dextromethorphan-guaiFENesin [facility] DM) 30-600 MG 12hr tablet Take 1 tablet by mouth 2 (two) times daily.    [provider]  diclofenac Sodium (VOLTAREN) 1 % GEL Apply 4 g topically 4 (four) times daily. Patient taking differently: Apply 4 g topically 4 (four) times daily as needed (pain).  06/04/20   Deno Etienne, DO  docusate sodium (COLACE) 100 MG capsule Take 1 capsule (100 mg total) by mouth 2 (two) times daily. 07/29/20   Newman Pies, MD  doxepin (SINEQUAN) 25 MG capsule Take 1 capsule (25 mg total) by mouth at bedtime as needed (itching). 06/28/20   Valentina Shaggy, MD  estradiol (ESTRACE) 0.5 MG tablet TAKE 1 TABLET BY MOUTH DAILY 07/26/20   Princess Bruins, MD  famotidine (PEPCID) 20 MG tablet TAKE 1 TABLET(20 MG) BY MOUTH TWICE DAILY 08/23/20   Valentina Shaggy, MD  fluconazole (DIFLUCAN) 150 MG tablet Take 1 tablet (150 mg total) by mouth daily. Repeat in one week if symptoms persist. 11/15/20   Valentina Shaggy, MD  fluticasone [facility]) 50 MCG/ACT nasal spray SHAKE LIQUID AND USE 2 SPRAYS IN [facility] NOSTRIL DAILY 10/27/20   Martyn Ehrich, NP  hydrochlorothiazide (MICROZIDE) 12.5 MG capsule TAKE 1 CAPSULE(12.5 MG) BY MOUTH DAILY 11/18/20   Biagio Borg, MD  hydrOXYzine (ATARAX/VISTARIL) 50 MG tablet Take 1 tablet (50 mg total) by mouth at bedtime. 09/28/20   Plovsky, Berneta Sages, MD  ipratropium-albuterol (DUONEB) 0.5-2.5 (3) MG/3ML SOLN Take 3 mLs  by nebulization every 6 (six) hours as needed. 08/21/20   Tasia Catchings, Amy V, PA-C  irbesartan (AVAPRO) 75 MG tablet Take 1  tablet (75 mg total) by mouth daily. 06/12/20   Allie Bossier, MD  lactulose, encephalopathy, (GENERLAC) 10 GM/15ML SOLN TAKE 45 TO 90 ML( 30-60 GM) BY MOUTH TWICE DAILY AS NEEDED Patient taking differently: Take 30-60 g by mouth 2 (two) times daily as needed (constipation). TAKE 45 TO 90 ML( 30-60 GM) BY MOUTH TWICE DAILY AS NEEDED 03/20/19   Biagio Borg, MD  levocetirizine (XYZAL) 5 MG tablet TAKE 1 TABLET BY MOUTH EVERY DAY AS NEEDED FOR ALLERGIES 08/23/20   Valentina Shaggy, MD  montelukast (SINGULAIR) 10 MG tablet Take 1 tablet (10 mg total) by mouth at bedtime. 03/30/20   Martyn Ehrich, NP  oxyCODONE-acetaminophen (PERCOCET/ROXICET) 5-325 MG tablet Take 1-2 tablets by mouth every 4 (four) hours as needed for moderate pain. 07/29/20   Newman Pies, MD  pantoprazole (PROTONIX) 40 MG tablet TAKE 1 TABLET BY MOUTH EVERY DAY 10/24/20   Biagio Borg, MD  pravastatin (PRAVACHOL) 40 MG tablet TAKE 1 TABLET( 40MG ) BY MOUTH DAILY 10/24/20   Biagio Borg, MD  triamcinolone cream (KENALOG) 0.1 % APPLY TOPICALLY TO THE AFFECTED AREA TWICE DAILY 08/20/20   Biagio Borg, MD  UNKNOWN TO PATIENT Weekly allergy shots    [provider]  vitamin C (ASCORBIC ACID) 500 MG tablet Take 500 mg by mouth daily.     [provider]    Allergies    Codeine, Sulfa antibiotics, and Sulfonamide derivatives  Review of Systems   Review of Systems  Constitutional: Negative for fever.  HENT: Negative for ear pain and sore throat.   Eyes: Negative for pain and visual disturbance.  Respiratory: Positive for shortness of breath and wheezing. Negative for cough.   Cardiovascular: Negative for chest pain.  Gastrointestinal: Negative for abdominal pain, constipation, diarrhea, nausea and vomiting.  Genitourinary: Negative for dysuria and hematuria.  Musculoskeletal: Negative for back pain.  Skin: Negative for rash.  Neurological: Positive for dizziness, numbness and headaches.  All other systems  reviewed and are negative.   Physical Exam Updated Vital Signs BP (!) 143/84   Pulse 70   Temp 98.2 F (36.8 C) (Oral)   Resp 15   Ht 5' (1.524 m)   Wt 72.6 kg   SpO2 97%   BMI 31.25 kg/m   Physical Exam Vitals and nursing note reviewed.  Constitutional:      General: She is not in acute distress.    Appearance: She is well-developed and well-nourished.  HENT:     Head: Normocephalic and atraumatic.  Eyes:     Conjunctiva/sclera: Conjunctivae normal.  Cardiovascular:     Rate and Rhythm: Normal rate and regular rhythm.     Heart sounds: No murmur heard.   Pulmonary:     Effort: Pulmonary effort is normal. No respiratory distress.     Breath sounds: Normal breath sounds.  Abdominal:     Palpations: Abdomen is soft.     Tenderness: There is no abdominal tenderness.  Musculoskeletal:        General: No edema.     Cervical back: Neck supple.  Skin:    General: Skin is warm and dry.  Neurological:     Mental Status: She is alert.     Comments: Mental Status:  Alert, thought content appropriate, able to give a coherent history. Speech fluent without evidence of  aphasia. Able to follow 2 step commands without difficulty.  Cranial Nerves:  II: pupils equal, round, reactive to light III,IV, VI: ptosis not present, extra-ocular motions intact bilaterally  V,VII: smile symmetric, decreased sensation to the left side of the face VIII: hearing grossly normal to voice  X: uvula elevates symmetrically  XI: bilateral shoulder shrug symmetric and strong XII: midline tongue extension without fassiculations Motor:  Normal tone. 5/5 strength of BUE and BLE major muscle groups including with exception of slightly decreased grip strength to the LUE Sensory: decreased sensation to the LUE/LLE   Psychiatric:        Mood and Affect: Mood and affect normal.     ED Results / Procedures / Treatments   Labs (all labs ordered are listed, but only abnormal results are  displayed) Labs Reviewed  CBC - Abnormal; Notable for the following components:      Result Value   Hemoglobin 10.2 (*)    HCT 33.2 (*)    MCH 24.8 (*)    RDW 16.6 (*)    All other components within normal limits  BASIC METABOLIC PANEL    EKG None  Radiology CT Head Wo Contrast  Result Date: 12/02/2020 CLINICAL DATA:  Hypertension EXAM: CT HEAD WITHOUT CONTRAST TECHNIQUE: Contiguous axial images were obtained from the base of the skull through the vertex without intravenous contrast. COMPARISON:  MRI 07/21/2019, CT brain 09/04/2017 FINDINGS: Brain: No hemorrhage or intracranial mass. Suspected acute to subacute lacunar infarct within the right basal ganglia. The ventricles are nonenlarged. Vascular: No hyperdense vessels.  Carotid vascular calcification Skull: Normal. Negative for fracture or focal lesion. Sinuses/Orbits: No acute finding. Other: None IMPRESSION: Suspected acute to subacute lacunar infarct within the right basal ganglia. Negative for hemorrhage. Electronically Signed   By: Donavan Foil M.D.   On: 12/02/2020 19:09   MR BRAIN WO CONTRAST  Result Date: 12/02/2020 CLINICAL DATA:  Hypertension of recent onset.  Abnormal head CT. EXAM: MRI HEAD WITHOUT CONTRAST TECHNIQUE: Multiplanar, multiecho pulse sequences of the brain and surrounding structures were obtained without intravenous contrast. COMPARISON:  Head CT earlier same day.  MRI 07/21/2019 FINDINGS: Brain: Diffusion imaging does not show any acute or subacute infarction. No focal abnormality affects the brainstem or cerebellum. There is an old infarction in the anterior limb internal capsule/anterior basal ganglia on the right. Few other old small vessel infarctions noted in the deep white matter and basal ganglia. No cortical or large vessel territory infarction. No mass lesion, hemorrhage, hydrocephalus or extra-axial collection. Vascular: Major vessels at the base of the brain show flow. Skull and upper cervical spine:  Negative Sinuses/Orbits: Clear/normal Other: None IMPRESSION: No acute finding by MRI. 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 This is not recent, as was suggested may be the case on the CT from earlier today. Mild chronic small-vessel ischemic changes elsewhere affecting the cerebral hemispheric white matter and basal ganglia. Electronically Signed   By: Nelson Chimes M.D.   On: 12/02/2020 21:22   DG Chest Portable 1 View  Result Date: 12/02/2020 CLINICAL DATA:  Shortness of breath EXAM: PORTABLE CHEST 1 VIEW COMPARISON:  None. FINDINGS: The heart size and mediastinal contours are within normal limits. Both lungs are clear. Surgical hardware in the cervical spine. IMPRESSION: No active disease. Electronically Signed   By: Donavan Foil M.D.   On: 12/02/2020 19:02    Procedures Procedures (including critical care time)  Medications Ordered in ED Medications  ipratropium-albuterol (DUONEB) 0.5-2.5 (3) MG/3ML nebulizer solution 3 mL (3 mLs Nebulization Given 12/02/20 1805)    ED Course  I have reviewed the triage vital signs and the nursing notes.  Pertinent labs & imaging results that were available during my care of the patient were reviewed by me and considered in my medical decision making (see chart for details).    MDM Rules/Calculators/A&P                          59 year old female presenting the emergency department today for evaluation of elevated blood pressure that was noted at her allergy office prior to arrival.  On my eval she is complaining of some shortness of breath but she did miss her afternoon DuoNeb treatment as she has a history of COPD. Neb tx given in ed and sxs resolved. She also states that for the last few days she has had a headache and dizziness but this was following a syncopal episode where she had questionable head trauma.  She does have a history of hypertension but per chart review patient discontinued her home  medications due to concern that it may cause cancer.  On exam today she does have some sensory deficit on the left side with some possible decreased strength with grip strength on the left otherwise reassuring.  Reviewed/interpreted labs CBC with no leukocytosis, anemia present but stable from prior BMP is unremarkable  Imaging reviewed/interpreted CT head - Suspected acute to subacute lacunar infarct within the right basal ganglia. Negative for hemorrhage. CXR negative  9:00 PM CONSULT With Dr. Cheral Marker with neurology. He reviewed CT head. Recommends MRI and reconsult.   MRI shows an old infarct. No indication for admission at this time. Pt appropriate for f/u with pcp. No evidence of htn emergency today. Pt advised to log bps for the next week and see pcp in regards to restarting meds. She understands to f/u. Advised to return if worse. All questions answered, pt voices understanding and is in agreement.   Final Clinical Impression(s) / ED Diagnoses Final diagnoses:  Elevated blood pressure reading  Basal ganglia stroke [facility])    Rx / DC Orders ED Discharge Orders    None       Bishop Dublin 12/02/20 2133    Hayden Rasmussen, MD 12/02/20 2315

## 2020-12-02 NOTE — ED Notes (Signed)
Patient transported to MRI 

## 2020-12-02 NOTE — Telephone Encounter (Signed)
   Patient calling to report blood pressure 155/103 and dizziness Call transferred to [facility]

## 2020-12-02 NOTE — ED Notes (Signed)
Patient transported to CT 

## 2020-12-02 NOTE — Telephone Encounter (Signed)
[facility]   Caller fainted on SAT and has headaches/diziness. Current BP 155/103. Has an appt on WED 12/22 but asks about until then. Is stressed by holidays and finances. Mentions depression about not being able to afford gifts for grandkids.  [facility] advised: Go to ED Now  Patient understood and complied.  Patient called office back and scheduled an appt on 12/07/20.

## 2020-12-02 NOTE — Discharge Instructions (Addendum)
Please keep a log of your blood pressures at home and follow-up with your regular doctor as you will likely need to be restarted on blood pressure medications.  Please follow up with your primary care provider within 5-7 days for re-evaluation of your symptoms. If you do not have a primary care provider, information for a healthcare clinic has been provided for you to make arrangements for follow up care. Please return to the emergency department for any new or worsening symptoms.

## 2020-12-02 NOTE — ED Triage Notes (Signed)
Pt here with reports of hypertension onset 1 week ago. Hx hypertension, no recent changes to medications. States BP has been 155/103. No neuro deficits noted.

## 2020-12-06 ENCOUNTER — Other Ambulatory Visit: Payer: Self-pay | Admitting: Allergy & Immunology

## 2020-12-07 ENCOUNTER — Ambulatory Visit (INDEPENDENT_AMBULATORY_CARE_PROVIDER_SITE_OTHER): Payer: Medicare HMO | Admitting: Internal Medicine

## 2020-12-07 ENCOUNTER — Encounter: Payer: Self-pay | Admitting: Internal Medicine

## 2020-12-07 ENCOUNTER — Other Ambulatory Visit: Payer: Self-pay

## 2020-12-07 VITALS — BP 120/82 | HR 76 | Temp 98.2°F | Ht 60.0 in | Wt 159.0 lb

## 2020-12-07 DIAGNOSIS — R21 Rash and other nonspecific skin eruption: Secondary | ICD-10-CM

## 2020-12-07 DIAGNOSIS — Z8673 Personal history of transient ischemic attack (TIA), and cerebral infarction without residual deficits: Secondary | ICD-10-CM

## 2020-12-07 DIAGNOSIS — I10 Essential (primary) hypertension: Secondary | ICD-10-CM | POA: Diagnosis not present

## 2020-12-07 DIAGNOSIS — E7849 Other hyperlipidemia: Secondary | ICD-10-CM | POA: Diagnosis not present

## 2020-12-07 DIAGNOSIS — R7303 Prediabetes: Secondary | ICD-10-CM | POA: Diagnosis not present

## 2020-12-07 MED ORDER — ATORVASTATIN CALCIUM 40 MG PO TABS: 40.0000 mg | ORAL_TABLET | Freq: Every day | ORAL | 3 refills | Status: DC

## 2020-12-07 MED ORDER — IRBESARTAN 75 MG PO TABS: 75.0000 mg | ORAL_TABLET | Freq: Every day | ORAL | 3 refills | Status: DC

## 2020-12-07 MED ORDER — TRIAMCINOLONE ACETONIDE 0.1 % EX CREA: TOPICAL_CREAM | CUTANEOUS | 0 refills | Status: DC

## 2020-12-07 NOTE — Progress Notes (Deleted)
   Subjective:    Patient ID: [name], female    DOB: [birth year], 59 y.o.   MRN: [id number]  HPI    Review of Systems     Objective:   Physical Exam BP 120/82 (BP Location: Left Arm, Patient Position: Sitting, Cuff Size: Large)   Pulse 76   Temp 98.2 F (36.8 C) (Oral)   Ht 5' (1.524 m)   Wt 159 lb (72.1 kg)   SpO2 94%   BMI 31.05 kg/m         Assessment & Plan:   c

## 2020-12-07 NOTE — Patient Instructions (Signed)
Ok to stop the pravastatin  Remember to start and take Aspirin 81 mg (enteric coated) EVERY day as this helps prevent strokes  Please take all new medication as prescribed - the lipitor at 40 mg per day  Please continue all other medications as before, including to restart the avapro 75 mg per day  Please take all new medication as prescribed - the steroid cream for the rash  Please have the pharmacy call with any other refills you may need.  Please continue your efforts at being more active, low cholesterol diet, and weight control.  Please keep your appointments with your specialists as you may have planned  Please make an Appointment to return in 3 months

## 2020-12-11 ENCOUNTER — Encounter: Payer: Self-pay | Admitting: Internal Medicine

## 2020-12-11 DIAGNOSIS — Z8673 Personal history of transient ischemic attack (TIA), and cerebral infarction without residual deficits: Secondary | ICD-10-CM | POA: Insufficient documentation

## 2020-12-11 NOTE — Assessment & Plan Note (Signed)
Unocntrolled, to restart the avapro, cont monitor bp at home and next visit

## 2020-12-11 NOTE — Progress Notes (Signed)
Established Patient Office Visit  Subjective:  Patient ID: [name], female    DOB: 14-Nov-1961  Age: 59 y.o. MRN: [id number]      Chief Complaint: (concise statement describing the symptom, problem, condition, diagnosis, physician recommended return, or other factor as reason for encounter) follow up HTN, HLD and hyperglycemia, rash, and new dx hx of stroke       HPI:  [name] is a 59 y.o. female here to f/u; overall doing ok,  Pt denies chest pain, increasing sob or doe, wheezing, orthopnea, PND, increased LE swelling, palpitations, dizziness or syncope.  Pt denies new neurological symptoms such as new headache, or facial or extremity weakness or numbness.  Pt denies polydipsia, polyuria, or symptomatic low sugars. 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  Admits to not taking the avapro and asa 81.   .        Wt Readings from Last 3 Encounters:  12/07/20 159 lb (72.1 kg)  12/02/20 160 lb (72.6 kg)  11/15/20 161 lb (73 kg)   BP Readings from Last 3 Encounters:  12/07/20 120/82  12/02/20 129/87  11/15/20 130/86        Also c/o acute problem of: location/onset/quality/quantity/aggrevating/alleviating/other - itchy rash, 2 wks, small areas right and left sided chest, scaly, nothing seems to make better or worse.    Past Medical History:  Diagnosis Date  . ALLERGIC RHINITIS 05/12/2009   Qualifier: Diagnosis of  By: Lenn Cal Deborra Medina), Wynona Canes    . Anemia, iron deficiency 12/22/2014   pt. denies  . Anginal pain ([place])   . Anxiety   . Arthritis   . Asthma   . ASTHMA 05/12/2009   Severe AFL (Spirometry 05/2009: pre-BD FEV1 0.87L 34% pred, post-BD FEV1 1.11L 44% pred) Volumes hyperinflated Decreased DLCO that does not fully correct to normal range for alveolar volume.     . Bipolar disorder ([place])   . COPD  08/24/2009   Qualifier: Diagnosis of  By: Burnett Kanaris    . COPD (chronic obstructive pulmonary disease) ([place])   . Depression   . Dyspnea    sometimes when exerting self  . Fibromyalgia 05/14/2014  . GERD (gastroesophageal reflux disease)   . Headache   . History of kidney stones   . Hyperlipidemia 04/20/2017  . HYPERTENSION 05/12/2009   Qualifier: Diagnosis of  By: Lenn Cal Deborra Medina), Wynona Canes    . Kidney stones   . Peripheral vascular disease ([place])   . Pneumonia   . Prediabetes 02/23/2014   pt. denies  . Seizure ([place])   . Seizure ([place])   . Urticaria    Past Surgical History:  Procedure Laterality Date  . ABDOMINAL HYSTERECTOMY    . ANTERIOR CERVICAL DECOMP/DISCECTOMY FUSION N/A 07/28/2020   Procedure: ANTERIOR CERVICAL DECOMPRESSION/DISCECTOMY FUSION. INTERBODY PROTHESIS, PLATE/SCREWS CERVICAL THREE- CERVICAL FOUR, CERVICAL FOUR- CERVICAL FIVE;  Surgeon: Newman Pies, MD;  Location: [facility];  Service: Neurosurgery;  Laterality: N/A;  . BACK SURGERY    . COLONOSCOPY  12/20/2011   Procedure: COLONOSCOPY;  Surgeon: Landry Dyke, MD;  Location: [facility];  Service: Endoscopy;  Laterality: N/A;  . COLONOSCOPY  03/05/2012   Procedure: COLONOSCOPY;  Surgeon: Landry Dyke, MD;  Location: [facility];  Service: Endoscopy;  Laterality: N/A;  . DIAGNOSTIC LAPAROSCOPY    . HEMORRHOID SURGERY    .  INCISE AND DRAIN ABCESS    . KIDNEY STONE SURGERY    . NECK SURGERY     x 2 Dr Orinda Kenner  . SPINE SURGERY  2019  . TOE SURGERY    . TUBAL LIGATION      reports that she quit smoking about 22 months ago. Her smoking use included cigarettes. She has a 9.00 pack-year smoking history. She has never used smokeless tobacco. She reports previous alcohol use. She reports current drug use. Drug: Marijuana. family history includes Alcohol abuse in her brother and father; Anesthesia problems in her father; Asthma in her brother, mother, and sister; Brain cancer in her sister; COPD in her mother;  Cancer in her father; Diabetes in her brother, father, mother, sister, and sister; Heart disease in her brother, mother, and sister; Hypertension in her sister; Kidney disease in her father; Sleep apnea in her brother. Allergies  Allergen Reactions  . Codeine Nausea And Vomiting  . Sulfa Antibiotics Nausea And Vomiting  . Sulfonamide Derivatives Nausea And Vomiting    REACTION: n/v   Current Outpatient Medications on File Prior to Visit  Medication Sig Dispense Refill  . albuterol (PROAIR HFA) 108 (90 Base) MCG/ACT inhaler Inhale 4 puffs into the lungs every 4 (four) hours as needed for wheezing or shortness of breath. (Patient taking differently: Inhale 2 puffs into the lungs every 4 (four) hours as needed for wheezing or shortness of breath.) 18 g 2  . albuterol (PROVENTIL) (2.5 MG/3ML) 0.083% nebulizer solution Take 3 mLs (2.5 mg total) by nebulization every 6 (six) hours as needed for wheezing or shortness of breath. Reported on 06/04/2016 120 mL 11  . Biotin 1000 MCG tablet Take 1,000 mcg by mouth daily.    . Budeson-Glycopyrrol-Formoterol (BREZTRI AEROSPHERE) 160-9-4.8 MCG/ACT AERO Inhale 2 puffs into the lungs 2 (two) times daily. 4.8 g 0  . carbamazepine (TEGRETOL) 200 MG tablet Take 2 tablets (400 mg total) by mouth 2 (two) times daily. 120 tablet 12  . Cholecalciferol (VITAMIN D) 125 MCG (5000 UT) CAPS Take 5,000 Units by mouth daily.    . cyclobenzaprine (FLEXERIL) 5 MG tablet TAKE 1 TABLET(5 MG) BY MOUTH THREE TIMES DAILY AS NEEDED FOR MUSCLE SPASMS 60 tablet 2  . dextromethorphan-guaiFENesin (MUCINEX DM) 30-600 MG 12hr tablet Take 1 tablet by mouth 2 (two) times daily.    . diclofenac Sodium (VOLTAREN) 1 % GEL Apply 4 g topically 4 (four) times daily. (Patient taking differently: Apply 4 g topically 4 (four) times daily as needed (pain).) 100 g 0  . docusate sodium (COLACE) 100 MG capsule Take 1 capsule (100 mg total) by mouth 2 (two) times daily. 60 capsule 0  . doxepin (SINEQUAN) 25  MG capsule TAKE 1 CAPSULE(25 MG) BY MOUTH AT BEDTIME AS NEEDED FOR ITCHING 30 capsule 2  . estradiol (ESTRACE) 0.5 MG tablet TAKE 1 TABLET BY MOUTH DAILY 90 tablet 4  . famotidine (PEPCID) 20 MG tablet TAKE 1 TABLET(20 MG) BY MOUTH TWICE DAILY 64 tablet 5  . fluticasone (FLONASE) 50 MCG/ACT nasal spray SHAKE LIQUID AND USE 2 SPRAYS IN EACH NOSTRIL DAILY 16 g 5  . hydrochlorothiazide (MICROZIDE) 12.5 MG capsule TAKE 1 CAPSULE(12.5 MG) BY MOUTH DAILY 90 capsule 3  . hydrOXYzine (ATARAX/VISTARIL) 50 MG tablet Take 1 tablet (50 mg total) by mouth at bedtime. 10 tablet 0  . ipratropium-albuterol (DUONEB) 0.5-2.5 (3) MG/3ML SOLN Take 3 mLs by nebulization every 6 (six) hours as needed. 60 mL 0  . lactulose, encephalopathy, (  GENERLAC) 10 GM/15ML SOLN TAKE 45 TO 90 ML( 30-60 GM) BY MOUTH TWICE DAILY AS NEEDED (Patient taking differently: Take 30-60 g by mouth 2 (two) times daily as needed (constipation). TAKE 45 TO 90 ML( 30-60 GM) BY MOUTH TWICE DAILY AS NEEDED) 473 mL 0  . levocetirizine (XYZAL) 5 MG tablet TAKE 1 TABLET BY MOUTH EVERY DAY AS NEEDED FOR ALLERGIES 30 tablet 5  . montelukast (SINGULAIR) 10 MG tablet Take 1 tablet (10 mg total) by mouth at bedtime. 30 tablet 6  . oxyCODONE-acetaminophen (PERCOCET/ROXICET) 5-325 MG tablet Take 1-2 tablets by mouth every 4 (four) hours as needed for moderate pain. 30 tablet 0  . pantoprazole (PROTONIX) 40 MG tablet TAKE 1 TABLET BY MOUTH EVERY DAY 90 tablet 3  . UNKNOWN TO PATIENT Weekly allergy shots    . vitamin C (ASCORBIC ACID) 500 MG tablet Take 500 mg by mouth daily.      Current Facility-Administered Medications on File Prior to Visit  Medication Dose Route Frequency Provider Last Rate Last Admin  . dupilumab (DUPIXENT) prefilled syringe 300 mg  300 mg Subcutaneous Q14 Days Valentina Shaggy, MD   300 mg at 12/02/20 0956        ROS:  All others reviewed and negative.  Objective        PE:  BP 120/82 (BP Location: Left Arm, Patient Position:  Sitting, Cuff Size: Large)   Pulse 76   Temp 98.2 F (36.8 C) (Oral)   Ht 5' (1.524 m)   Wt 159 lb (72.1 kg)   SpO2 94%   BMI 31.05 kg/m                 Constitutional: Pt appears in NAD               HENT: Head: NCAT.                Right Ear: External ear normal.                 Left Ear: External ear normal.                Eyes: . Pupils are equal, round, and reactive to light. Conjunctivae and EOM are normal               Nose: without d/c or deformity               Neck: Neck supple. Gross normal ROM               Cardiovascular: Normal rate and regular rhythm.                 Pulmonary/Chest: Effort normal and breath sounds without rales or wheezing.                Abd:  Soft, NT, ND, + BS, no organomegaly               Neurological: Pt is alert. At baseline orientation, motor grossly intact               Skin: Skin is warm. Scaly silvery rashes patchy to right upper and left mid chest, no other new lesions, LE edema - none               Psychiatric: Pt behavior is normal without agitation   Assessment/Plan:  [name] is a 59 y.o. Black or African American [2] female with  has a past medical history of  ALLERGIC RHINITIS (05/12/2009), Anemia, iron deficiency (12/22/2014), Anginal pain ([place]), Anxiety, Arthritis, Asthma, ASTHMA (05/12/2009), Bipolar disorder ([place]), COPD (08/24/2009), COPD (chronic obstructive pulmonary disease) ([place]), Depression, Dyspnea, Fibromyalgia (05/14/2014), GERD (gastroesophageal reflux disease), Headache, History of kidney stones, Hyperlipidemia (04/20/2017), HYPERTENSION (05/12/2009), Kidney stones, Peripheral vascular disease ([place]), Pneumonia, Prediabetes (02/23/2014), Seizure ([place]), Seizure ([place]), and Urticaria.   Assessment Plan  See problem oriented assessment and plan Labs reviewed for each problem: Lab Results  Component Value Date   WBC 10.3 12/02/2020   HGB 10.2 (L) 12/02/2020   HCT 33.2 (L) 12/02/2020   PLT 376 12/02/2020   GLUCOSE 81  12/02/2020   CHOL 217 (H) 06/17/2020   TRIG 74.0 06/17/2020   HDL 82.70 06/17/2020   LDLCALC 120 (H) 06/17/2020   ALT 17 10/06/2020   AST 16 10/06/2020   NA 140 12/02/2020   K 3.9 12/02/2020   CL 101 12/02/2020   CREATININE 0.66 12/02/2020   BUN 9 12/02/2020   CO2 29 12/02/2020   TSH 0.91 06/17/2020   HGBA1C 6.2 06/17/2020   MICROALBUR <0.7 06/17/2020    Micro: none  Cardiac tracings I have personally interpreted today:  none  Pertinent Radiological findings (summarize): ct and mri brain Dec 02, 2020   I spent total 33 minutes in caring for the patient for this visit:  1) by communicating with the patient and family/caregiver during the visit  2) by review of pertinent vital sign data, physical examination and labs as documented in the assessment and plan  3) by review of pertinent imaging - as above  4) by review of pertinent procedures - none today  5) by obtaining and reviewing separately obtained information from family/caretaker and Care Everywhere - none today  6) by ordering medications  7) by ordering tests  8) by documenting all of this clinical information in the EHR including the management of each problem noted today in assessment and plan   Problem List Items Addressed This Visit      Medium   Rash   Prediabetes - Primary   Hyperlipidemia   Relevant Medications   atorvastatin (LIPITOR) 40 MG tablet   irbesartan (AVAPRO) 75 MG tablet   HTN (hypertension) (Chronic)   Relevant Medications   atorvastatin (LIPITOR) 40 MG tablet   irbesartan (AVAPRO) 75 MG tablet   History of stroke      Meds ordered this encounter  Medications  . DISCONTD: irbesartan (AVAPRO) 75 MG tablet    Sig: Take 1 tablet (75 mg total) by mouth daily.    Dispense:  90 tablet    Refill:  3  . atorvastatin (LIPITOR) 40 MG tablet    Sig: Take 1 tablet (40 mg total) by mouth daily.    Dispense:  90 tablet    Refill:  3  . irbesartan (AVAPRO) 75 MG tablet    Sig: Take 1  tablet (75 mg total) by mouth daily.    Dispense:  90 tablet    Refill:  3  . triamcinolone (KENALOG) 0.1 %    Sig: APPLY TOPICALLY TO THE AFFECTED AREA TWICE DAILY    Dispense:  30 g    Refill:  0    Follow-up: 3 mo   Cathlean Cower, MD 12/11/2020 8:55 PM [facility]

## 2020-12-11 NOTE — Assessment & Plan Note (Signed)
Uncontrolled, to change pravastatin to lipitor 40 qd, cont lower chol diet

## 2020-12-11 NOTE — Assessment & Plan Note (Signed)
D/w pt - to restart the asa 81 qd

## 2020-12-11 NOTE — Assessment & Plan Note (Signed)
Mild, for triam cr prn,  to f/u any worsening symptoms or concerns 

## 2020-12-11 NOTE — Assessment & Plan Note (Signed)
Lab Results  Component Value Date   HGBA1C 6.2 06/17/2020  stable overall by history and exam, recent data reviewed with pt, and pt to continue medical treatment as before,  to f/u any worsening symptoms or concerns

## 2020-12-12 ENCOUNTER — Other Ambulatory Visit: Payer: Self-pay | Admitting: Allergy & Immunology

## 2020-12-13 ENCOUNTER — Ambulatory Visit (INDEPENDENT_AMBULATORY_CARE_PROVIDER_SITE_OTHER): Payer: Medicare HMO | Admitting: *Deleted

## 2020-12-13 DIAGNOSIS — J309 Allergic rhinitis, unspecified: Secondary | ICD-10-CM | POA: Diagnosis not present

## 2020-12-19 ENCOUNTER — Ambulatory Visit: Payer: Self-pay

## 2020-12-19 ENCOUNTER — Telehealth: Payer: Self-pay | Admitting: Allergy & Immunology

## 2020-12-19 NOTE — Telephone Encounter (Signed)
Patient states she lost her Breztri inhaler and would like a sample. Patient is coming in at 10am today for an injection and will check back then.  Please advise.

## 2020-12-19 NOTE — Telephone Encounter (Signed)
Attempted to call will leave a sample in the shot room for patient to collect.

## 2020-12-19 NOTE — Telephone Encounter (Signed)
Patient notified per [place]

## 2020-12-20 ENCOUNTER — Institutional Professional Consult (permissible substitution): Payer: Medicare HMO | Admitting: Diagnostic Neuroimaging

## 2020-12-20 ENCOUNTER — Other Ambulatory Visit: Payer: Self-pay

## 2020-12-20 ENCOUNTER — Ambulatory Visit (INDEPENDENT_AMBULATORY_CARE_PROVIDER_SITE_OTHER): Payer: Medicare HMO | Admitting: Allergy & Immunology

## 2020-12-20 ENCOUNTER — Encounter: Payer: Self-pay | Admitting: Allergy & Immunology

## 2020-12-20 VITALS — BP 128/80 | HR 95 | Temp 98.5°F | Resp 18 | Ht 63.0 in | Wt 160.6 lb

## 2020-12-20 DIAGNOSIS — B999 Unspecified infectious disease: Secondary | ICD-10-CM | POA: Diagnosis not present

## 2020-12-20 DIAGNOSIS — J3089 Other allergic rhinitis: Secondary | ICD-10-CM | POA: Diagnosis not present

## 2020-12-20 DIAGNOSIS — J4540 Moderate persistent asthma, uncomplicated: Secondary | ICD-10-CM | POA: Diagnosis not present

## 2020-12-20 DIAGNOSIS — L508 Other urticaria: Secondary | ICD-10-CM

## 2020-12-20 NOTE — Patient Instructions (Addendum)
1. Asthma-COPD overlap syndrome - Lung testing not done today. - We are not going to make any medication changes at this time.  - I do not think that your stroke is related to the Dupixent at all. - I am going to talk to the Medical Science Liaison just to make sure.  - Daily controller medication(s): Breztri two puffs twice daily with spacer + Dupixent 300 mg every two weeks - Prior to physical activity: Proventil (ORANAGE INHALER) 2 puffs or Ventolin (GRAY INHALER) 2 puffs 10-15 minutes before physical activity. - Rescue medications: Proventil (ORANAGE INHALER) 2 puffs or Ventolin (GRAY INHALER) 2 puffs AND 2 puffs Atrovent (GREEN INHALER) every 4-6 hours as needed - Asthma control goals:  * Full participation in all desired activities (may need albuterol before activity) * Albuterol use two time or less a week on average (not counting use with activity) * Cough interfering with sleep two time or less a month * Oral steroids no more than once a year * No hospitalizations   2. Chronic urticaria - Continue with Xyzal twice daily. - Continue with famotidine twice daily. - Continue with Doxepin 25mg  at night to help with itching.   3. 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 - Continue with allergy shots.   4. Return in about 6 months (around 06/19/2021).    Please inform 08/20/2021 of any Emergency Department visits, hospitalizations, or changes in symptoms. Call us before going to the ED for breathing or allergy symptoms since we might be able to fit you in for a sick visit. Feel free to contact us anytime with any questions, problems, or concerns.  It was a pleasure to see you again today!  Websites that have reliable patient information: 1. American Academy of Asthma, Allergy, and Immunology: www.aaaai.org 2. Food Allergy Research and Education (FARE): foodallergy.org 3. Mothers of Asthmatics:  http://www.asthmacommunitynetwork.org 4. American College of Allergy, Asthma, and Immunology: www.acaai.org   COVID-19 Vaccine Information can be found at: [place] For questions related to vaccine distribution or appointments, please email [email or web address] or call [phone].     "Like" [phone] on Facebook and Instagram for our latest updates!       Make sure you are registered to vote! If you have moved or changed any of your contact information, you will need to get this updated before voting!  In some cases, you MAY be able to register to vote online: [place]

## 2020-12-20 NOTE — Progress Notes (Signed)
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 chest CT next visit)   Perennial allergic rhinitis(dust mites)- on allergen immunotherapy  Chronic urticaria  Recurrent infections - with largely normal workup aside from non-protective Pneumococcal titers and non-protective Diptheria titers  Concern for adverse reaction (stroke) to [place] - unlikely, but checking with the Medical Science Liaison just to be sure (little in literature review about this)  Plan/Recommendations:   1. Asthma-COPD overlap syndrome - Lung testing not done today. - We are not going to make any medication changes at this time.  - I do not think that your stroke is related to the [place] at all. - I am going to talk to the [facility] just to make sure.  - Daily controller medication(s): Breztri 142mcg two puffs twice daily with spacer + Dupixent 300 mg every two weeks - Prior to physical activity: Proventil (ORANAGE INHALER) 2 puffs or Ventolin (GRAY INHALER) 2 puffs 10-15 minutes before physical activity. - Rescue medications: Proventil (ORANAGE INHALER) 2 puffs or Ventolin (GRAY INHALER) 2 puffs AND 2 puffs Atrovent (GREEN INHALER) every 4-6 hours as needed - Asthma control goals:  * Full participation in all desired activities (may need albuterol before activity) * Albuterol use two time or less a week on average (not counting use with activity) * Cough interfering with sleep two time or less a month * Oral steroids no more than once a year * No hospitalizations   2. Chronic urticaria - Continue with Xyzal twice daily. - Continue with famotidine twice daily. - Continue with Doxepin 25mg  at night to help with itching.   3. 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 - Continue with allergy shots.   4. Return in  about 6 months (around 06/19/2021).   Subjective:   [name] is a 60 y.o. female presenting today for follow up of  Chief Complaint  Patient presents with  . Asthma  . Medication Management    Recent stroke issues needs to see if it okay to continue Dupixent and allergy injections.   . Allergic Rhinitis     [name] has a history of the following: Patient Active Problem List   Diagnosis Date Noted  . History of stroke 12/11/2020  . Medication management 10/21/2020  . Healthcare maintenance 08/11/2020  . Cervical spondylosis with myelopathy and radiculopathy 07/28/2020  . Preop exam for internal medicine 06/19/2020  . Hypotension due to medication 06/11/2020  . Status asthmaticus 06/09/2020  . Leg cramping 08/12/2019  . Hypophosphatemia 01/07/2019  . Vertigo 09/23/2018  . Dysfunction of left eustachian tube 09/23/2018  . Gait disorder 04/21/2018  . Leukocytosis 04/15/2018  . Nausea & vomiting 04/15/2018  . Seizure ([place])   . Cervical radiculopathy 02/18/2018  . 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 Syncope 09/04/2017  . Constipation 09/04/2017  . Nonintractable headache 09/04/2017  . Leg swelling 07/26/2017  . 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 Hyperlipidemia 04/20/2017  . Pedal edema 04/19/2017  . Bipolar I disorder ([place]) 09/19/2016  . Facial pain 07/12/2016  . Rash 06/14/2016  . Migraine 01/25/2016  . Idiopathic urticaria/pruritus 01/23/2016  . Perennial allergic rhinitis 01/23/2016  . Oral candidiasis 01/23/2016  . Greater trochanteric bursitis of both hips 09/08/2015  . Chest pain 06/22/2015  . Itching 06/22/2015  . Bilateral knee pain 06/22/2015  . Bilateral  hip pain 06/22/2015  . Anemia, iron deficiency 12/22/2014  . Fatigue 12/21/2014  . Intertrigo 08/07/2014  . Dyspareunia 07/12/2014  . Menopausal syndrome (hot flushes) 07/12/2014  . History of tobacco abuse 07/12/2014  . Dizziness 06/22/2014  . Weakness 06/22/2014  .  Fibromyalgia 05/14/2014  . Encounter for well adult exam with abnormal findings 04/22/2014  . Recurrent boils 04/22/2014  . Recurrent falls 04/22/2014  . Peripheral vascular disease ([place])   . Depression   . Anxiety   . GERD (gastroesophageal reflux disease)   . Prediabetes 02/23/2014  . Screening mammogram for high-risk patient 02/23/2014  . Back pain 07/22/2013  . COPD/asthma 08/24/2009  . Headache(784.0) 08/24/2009  . HTN (hypertension) 05/12/2009    History obtained from: chart review and patient.  [name] is a 60 y.o. female presenting for a follow up visit.  She is well-known to this practice.  She has a history of noncompliance.  She was last seen in November 2021.  At that time, we did not do lung testing.  We continued her on Breztri 2 puffs twice daily in combination with Dupixent every 2 weeks.  She has albuterol that she uses prior to physical activity.  She also has a history of urticaria which was under control with Xyzal twice daily as well as famotidine twice daily.  She also has doxepin 25 mg to use at night as needed.  For her mixed rhinitis, we continue with Flonase as well as allergy shots.  We did give her a course of prednisone since she was having some acute symptoms.  She was also endorsing some vaginal and breast itching, so we started fluconazole once daily for 2 weeks.  Since last visit, she has mostly done well.  She had a fairly good holiday, as she stayed to herself. She reports that there are many people in her family who have tested positive for [place]. She did not gather thankfully, and is not feeling sick.   She presents today to discuss concerns for Dupixent leading to strokes.  She tells me that her blood pressure was elevated on the morning of December 17.  She called her PCP and they recommended going straight to the emergency room.  She decided to get her allergy shot first and then go to the emergency room.  She did that and a thorough work-up showed an  old ischemic stroke.  She did have some subtle weakness at the time as well.  According to the ED note, she had stopped her blood pressure medication because she was concerned that it would cause cancer.  She had a head CT that was notable for a suspected acute to subacute lacunar infarct in the right basal ganglia.  She had no evidence of a hemorrhage.  An MRI was recommended.  This showed an old infarct.  Reading below:   No acute finding by MRI. 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 This is not recent, as was suggested may be the case on the CT from earlier today. Mild chronic small-vessel ischemic changes elsewhere affecting the cerebral hemispheric white matter and basal ganglia.  She did follow-up to see her PCP, who recommended some blood pressure medication changes, or at least compliance.  She remains on her angiotensin receptor blocker only.  They did not add any medications or take any away.  She does not think this is related to her Dupixent, but the shot room nurse wanted her to discuss with me.  She would prefer to stay on the Dupixent, as it has helped to keep her out of the hospital.  She thinks this in combination with the Markus Daft has made a big difference.  She has not needed her rescue inhaler much at all.  She has not needed prednisone in quite some time. ACT is 10, indicating poor asthma control. However, this does not match what she shares with me during the visit.   Her hives have been under good control.  She has not had any allergic rhinitis breakthrough symptoms.  Allergy shots are going well.  Her last one was 1 week ago.  Otherwise, there have been no changes to her past medical history, surgical history, family history, or social history.    Review of Systems  Constitutional: Negative.  Negative for fever, malaise/fatigue and weight loss.  HENT: Negative.  Negative for congestion, ear discharge and ear pain.   Eyes:  Negative for pain, discharge and redness.  Respiratory: Negative for cough, sputum production, shortness of breath and wheezing.   Cardiovascular: Negative.  Negative for chest pain and palpitations.  Gastrointestinal: Negative for abdominal pain, constipation, diarrhea, heartburn, nausea and vomiting.  Skin: Negative.  Negative for itching and rash.  Neurological: Negative for dizziness and headaches.  Endo/Heme/Allergies: Negative for environmental allergies. Does not bruise/bleed easily.       Objective:   Blood pressure 128/80, pulse 95, temperature 98.5 F (36.9 C), temperature source Temporal, resp. rate 18, height 5\' 3"  (1.6 m), weight 160 lb 9.6 oz (72.8 kg), SpO2 95 %. Body mass index is 28.45 kg/m.   Physical Exam:  Physical Exam Constitutional:      Appearance: She is well-developed.     Comments: Wearing a new wig today (she tells me that she has around 500 at home). Very bubbly.   HENT:     Head: Normocephalic and atraumatic.     Right Ear: Tympanic membrane, ear canal and external ear normal.     Left Ear: Tympanic membrane, ear canal and external ear normal.     Nose: No nasal deformity, septal deviation, mucosal edema, rhinorrhea or epistaxis.     Right Turbinates: Enlarged and swollen.     Left Turbinates: Enlarged and swollen.     Right Sinus: No maxillary sinus tenderness or frontal sinus tenderness.     Left Sinus: No maxillary sinus tenderness or frontal sinus tenderness.     Mouth/Throat:     Mouth: Oropharynx is clear and moist. Mucous membranes are not pale and not dry.     Pharynx: Uvula midline.  Eyes:     General:        Right eye: No discharge.        Left eye: No discharge.     Extraocular Movements: EOM normal.     Conjunctiva/sclera: Conjunctivae normal.     Right eye: Right conjunctiva is not injected. No chemosis.    Left eye: Left conjunctiva is not injected. No chemosis.    Pupils: Pupils are equal, round, and reactive to light.   Cardiovascular:     Rate and Rhythm: Normal rate and regular rhythm.     Heart sounds: Normal heart sounds.  Pulmonary:     Effort: Pulmonary effort is normal. No tachypnea, accessory muscle usage or respiratory distress.     Breath sounds: Normal breath sounds. No wheezing, rhonchi or rales.     Comments: Moving air well in all lung fields. No increased work of breathing noted.  Chest:  Chest wall: No tenderness.  Lymphadenopathy:     Cervical: No cervical adenopathy.  Skin:    Coloration: Skin is not pale.     Findings: No abrasion, erythema, petechiae or rash. Rash is not papular, urticarial or vesicular.  Neurological:     Mental Status: She is alert.  Psychiatric:        Mood and Affect: Mood and affect normal.        Behavior: Behavior is cooperative.      Diagnostic studies: none     Salvatore Marvel, MD  [facility]

## 2020-12-27 DIAGNOSIS — Z981 Arthrodesis status: Secondary | ICD-10-CM | POA: Diagnosis not present

## 2020-12-27 DIAGNOSIS — R030 Elevated blood-pressure reading, without diagnosis of hypertension: Secondary | ICD-10-CM | POA: Insufficient documentation

## 2020-12-27 DIAGNOSIS — Z6828 Body mass index (BMI) 28.0-28.9, adult: Secondary | ICD-10-CM | POA: Diagnosis not present

## 2020-12-27 DIAGNOSIS — M4712 Other spondylosis with myelopathy, cervical region: Secondary | ICD-10-CM | POA: Diagnosis not present

## 2020-12-30 ENCOUNTER — Ambulatory Visit (INDEPENDENT_AMBULATORY_CARE_PROVIDER_SITE_OTHER): Payer: Medicare HMO | Admitting: *Deleted

## 2020-12-30 DIAGNOSIS — J309 Allergic rhinitis, unspecified: Secondary | ICD-10-CM

## 2021-01-03 ENCOUNTER — Ambulatory Visit: Payer: Self-pay

## 2021-01-06 ENCOUNTER — Ambulatory Visit: Payer: Self-pay

## 2021-01-07 ENCOUNTER — Ambulatory Visit: Payer: Self-pay

## 2021-01-07 NOTE — Telephone Encounter (Signed)
Pt called c/o weakness, blurred vision, weakness to both arms, speech difficulty (difficulty getting out what she is trying to say). Pt kept saying that her legs feels weak and she "feels drunk." Pt stated she had H/A last night but denies at time of call. Pt stated she was unsteady on her feet.Sx started at 0800 this am. Pt advised to call 911 now. Pt verbalized understanding.   Reason for Disposition . [1] Weakness (i.e., paralysis, loss of muscle strength) of the face, arm / hand, or leg / foot on one side of the body AND [2] sudden onset AND [3] present now (Exception: Bell's palsy suspected [i.e., weakness only one side of the face, developing over hours to days, no other symptoms])  Answer Assessment - Initial Assessment Questions 1. SYMPTOM: "What is the main symptom you are concerned about?" (e.g., weakness, numbness)     Weakness, numbness, feels drunk- difficulty speaking out of balance 2. ONSET: "When did this start?" (minutes, hours, days; while sleeping)     This am at 0800 3. LAST NORMAL: "When was the last time you were normal (no symptoms)?"     yesterday 4. PATTERN "Does this come and go, or has it been constant since it started?"  "Is it present now?"      Constant-yes 5. CARDIAC SYMPTOMS: "Have you had any of the following symptoms: chest pain, difficulty breathing, palpitations?"     no 6. NEUROLOGIC SYMPTOMS: "Have you had any of the following symptoms: headache, dizziness, vision loss, double vision, changes in speech, unsteady on your feet?"     Dizziness, blurred vision, changes in speech and unsteady on feet 7. OTHER SYMPTOMS: "Do you have any other symptoms?"     Feels drunk  Protocols used: NEUROLOGIC DEFICIT-A-AH

## 2021-01-09 ENCOUNTER — Ambulatory Visit (INDEPENDENT_AMBULATORY_CARE_PROVIDER_SITE_OTHER): Payer: Medicare HMO

## 2021-01-09 ENCOUNTER — Other Ambulatory Visit: Payer: Self-pay

## 2021-01-09 DIAGNOSIS — J309 Allergic rhinitis, unspecified: Secondary | ICD-10-CM

## 2021-01-09 DIAGNOSIS — J4540 Moderate persistent asthma, uncomplicated: Secondary | ICD-10-CM

## 2021-01-18 ENCOUNTER — Ambulatory Visit (INDEPENDENT_AMBULATORY_CARE_PROVIDER_SITE_OTHER): Payer: Medicare HMO

## 2021-01-18 DIAGNOSIS — J309 Allergic rhinitis, unspecified: Secondary | ICD-10-CM

## 2021-01-20 ENCOUNTER — Other Ambulatory Visit: Payer: Self-pay | Admitting: Allergy & Immunology

## 2021-01-24 ENCOUNTER — Other Ambulatory Visit: Payer: Self-pay

## 2021-01-24 ENCOUNTER — Ambulatory Visit (INDEPENDENT_AMBULATORY_CARE_PROVIDER_SITE_OTHER): Payer: Medicare HMO | Admitting: *Deleted

## 2021-01-24 DIAGNOSIS — J4540 Moderate persistent asthma, uncomplicated: Secondary | ICD-10-CM | POA: Diagnosis not present

## 2021-01-25 ENCOUNTER — Telehealth: Payer: Self-pay | Admitting: Internal Medicine

## 2021-01-25 ENCOUNTER — Telehealth: Payer: Self-pay | Admitting: Adult Health

## 2021-01-25 NOTE — Telephone Encounter (Signed)
LVM asking pt to call back to let us know that she is ok.

## 2021-01-25 NOTE — Telephone Encounter (Signed)
Patient is having difficulty breathing, refuses to go to ED and wanted to be seen in office today.   Call back number- [phone]

## 2021-01-25 NOTE — Telephone Encounter (Signed)
Patient called and said that her tongue is swollen and painful and she is having a hard time to breathe. Transferred to [facility].

## 2021-01-25 NOTE — Telephone Encounter (Signed)
LVM instructing pt to follow instructions of the [facility] RN or call the office.

## 2021-01-26 ENCOUNTER — Other Ambulatory Visit (HOSPITAL_COMMUNITY): Payer: Self-pay | Admitting: Psychiatry

## 2021-01-26 NOTE — Telephone Encounter (Signed)
[facility] Call/Report : ---Having swollen tongue-looks like rash to it, painful and hard to Breathe.  Advised call 911 now. Patient refused. Garnette Czech, RN called x2 and LVM.

## 2021-01-27 ENCOUNTER — Other Ambulatory Visit: Payer: Self-pay

## 2021-01-27 ENCOUNTER — Telehealth: Payer: Self-pay | Admitting: Allergy & Immunology

## 2021-01-27 ENCOUNTER — Ambulatory Visit
Admission: RE | Admit: 2021-01-27 | Discharge: 2021-01-27 | Disposition: A | Discharge status: Home / Self Care | Payer: Medicare HMO | Source: Ambulatory Visit | Attending: Family Medicine | Admitting: Family Medicine

## 2021-01-27 ENCOUNTER — Ambulatory Visit (INDEPENDENT_AMBULATORY_CARE_PROVIDER_SITE_OTHER): Payer: Medicare HMO | Admitting: Family Medicine

## 2021-01-27 ENCOUNTER — Other Ambulatory Visit: Payer: Self-pay | Admitting: Family Medicine

## 2021-01-27 ENCOUNTER — Encounter: Payer: Self-pay | Admitting: Family Medicine

## 2021-01-27 VITALS — BP 124/78 | HR 76 | Temp 97.2°F | Resp 16

## 2021-01-27 DIAGNOSIS — R059 Cough, unspecified: Secondary | ICD-10-CM | POA: Diagnosis not present

## 2021-01-27 DIAGNOSIS — J4551 Severe persistent asthma with (acute) exacerbation: Secondary | ICD-10-CM

## 2021-01-27 DIAGNOSIS — K219 Gastro-esophageal reflux disease without esophagitis: Secondary | ICD-10-CM

## 2021-01-27 DIAGNOSIS — J449 Chronic obstructive pulmonary disease, unspecified: Secondary | ICD-10-CM

## 2021-01-27 DIAGNOSIS — J3089 Other allergic rhinitis: Secondary | ICD-10-CM | POA: Diagnosis not present

## 2021-01-27 DIAGNOSIS — R079 Chest pain, unspecified: Secondary | ICD-10-CM | POA: Diagnosis not present

## 2021-01-27 DIAGNOSIS — R071 Chest pain on breathing: Secondary | ICD-10-CM

## 2021-01-27 DIAGNOSIS — L508 Other urticaria: Secondary | ICD-10-CM

## 2021-01-27 MED ORDER — IPRATROPIUM-ALBUTEROL 0.5-2.5 (3) MG/3ML IN SOLN
3.0000 mL | RESPIRATORY_TRACT | 1 refills | Status: DC | PRN
Start: 2021-01-27 — End: 2021-02-27

## 2021-01-27 MED ORDER — AZITHROMYCIN 250 MG PO TABS: ORAL_TABLET | ORAL | 0 refills | Status: DC

## 2021-01-27 MED ORDER — METHYLPREDNISOLONE ACETATE 80 MG/ML IJ SUSP
80.0000 mg | Freq: Once | INTRAMUSCULAR | Status: AC
  Administered 2021-01-27: 80 mg via INTRAMUSCULAR

## 2021-01-27 MED ORDER — IPRATROPIUM-ALBUTEROL 0.5-2.5 (3) MG/3ML IN SOLN: 3.0000 mL | RESPIRATORY_TRACT | 1 refills | Status: DC | PRN

## 2021-01-27 NOTE — Telephone Encounter (Signed)
Pt states she is having trouble breathing, tightness in her chest and is wheezing. Pt's symptoms started yesterday. Pt had a breathing treatment about ten min ago, has another appointment with her pulmonary doctor on Monday but would like a sample of Bretztri aerosphere.   Please advise.

## 2021-01-27 NOTE — Patient Instructions (Addendum)
Asthma Get a chest x-ray after you leave our clinic today Restart Breztri 2 puffs twice a day with a spacer to prevent cough or wheeze Begin DuoNeb via nebulizer once every 4 hours as needed for cough or wheeze Begin prednisone 10 mg tablets. Take 2 tablets twice a day for 3 days, then take 2 tablets once a day for 1 day, then take 1 tablet on the 5th day, then stop Depo-Medrol 80 IM injection given in the clinic today Continue Dupixent injections once every 2 weeks and have access to an epinephrine autoinjector set Call the clinic at any time with worsening respiratory symptoms or if you develop a fever  Allergic rhinitis Continue Xyzal 5 mg once a day as needed for runny nose Continue Flonase 2 sprays in each nostril once a day as needed for stuffy nose Consider saline nasal rinses as needed for nasal symptoms. Use this before any medicated nasal sprays for best result Continue allergen immunotherapy once a week and have access to an epinephrine auto-injector  Reflux Continue dietary and lifestyle modifications as listed below Continue pantoprazole 40 mg once a day as previously prescribed  Call the clinic if this treatment plan is not working well for you  Follow up in 1 week or sooner if needed.

## 2021-01-27 NOTE — Progress Notes (Signed)
Patient notified of xray results. Azithromycin ordered and patient aware. She will call the clinic with any worsening symptoms.

## 2021-01-27 NOTE — Progress Notes (Signed)
[place] [address] Dept: [phone]  FOLLOW UP NOTE  Patient ID: [name], female    DOB: 1960/12/28  Age: 60 y.o. MRN: [id number] Date of Office Visit: 01/27/2021  Assessment  Chief Complaint: Asthma  HPI [name] is a 60 year old female who presents the clinic for evaluation of acute asthma exacerbation.  She was last seen in this clinic on 12/20/2020 by Dr. Ernst Bowler for evaluation of asthma COPD overlap syndrome, perennial allergic rhinitis, chronic urticaria, recurrent infections, and concern for adverse reaction to Dupixent.  At today's visit, she reports that she ran out of Breztri inhaler about one week ago. She reports that she lost one of her Breztri inhalers and could not get this prescription as it was too early for refill at the pharmacy. She reports symptoms began on Tuesday including shortness of breath, wheeze and dry cough and worsened over the next few days. She reports that she began to experience a pain in her left side upon inspiration and it is reporducible when she touches the area. She is currently using her albuterol inhaler several times a day with no relief of symptoms. She continues Dupixent injections once every 2 weeks. Prior to last week she reports good control of asthma with infrequent albuterol use. Allergic rhinitis is reported as well controlled with Xyzal and Flonase daily. She continues allergen immunotherapy with no large local reactions. She reports a decrease in her symptoms of allergic rhinitis while continuing on allergen immunotherapy. She denies symptoms of reflux and continues pantoprazole 40 mg once a day. Her current medications are listed in the chart.    Drug Allergies:  Allergies  Allergen Reactions  . Codeine Nausea And Vomiting  . Sulfa Antibiotics Nausea And Vomiting  . Sulfonamide Derivatives Nausea And Vomiting    REACTION: n/v    Physical Exam: BP 124/78   Pulse 76   Temp (!) 97.2 F  (36.2 C) (Temporal)   Resp 16   SpO2 97%    Physical Exam Vitals reviewed.  Constitutional:      Appearance: Normal appearance.  HENT:     Head: Normocephalic and atraumatic.     Right Ear: Tympanic membrane normal.     Left Ear: Tympanic membrane normal.     Nose:     Comments: Bilateral nares normal with clear nasal drainage noted. Pharynx normal. Ears normal. Eyes normal.    Mouth/Throat:     Pharynx: Oropharynx is clear.  Eyes:     Conjunctiva/sclera: Conjunctivae normal.  Cardiovascular:     Rate and Rhythm: Normal rate and regular rhythm.     Heart sounds: Normal heart sounds. No murmur heard.   Pulmonary:     Comments: Upon presentation to the clinic patient with tripod posture and inspiratory retractions. Patient only able to speak in short phrases. Lungs with no wheeze and little air movement. Post bronchodilator therapy patient with bilateral wheeze, able to speak in full sentences, and sitting upright with no retractions.  Musculoskeletal:        General: Normal range of motion.     Cervical back: Normal range of motion and neck supple.  Skin:    General: Skin is warm and dry.  Neurological:     Mental Status: She is alert and oriented to person, place, and time.  Psychiatric:        Mood and Affect: Mood normal.        Behavior: Behavior normal.  Thought Content: Thought content normal.        Judgment: Judgment normal.     Diagnostics: Unable to perform spirometry upon initial presentation. Post bronchodilator therapy FVC 1.34, FEV1 0.66. Predicted FVC 2.60, predicted FEV1 2.05. Spirometry indicates moderate airway obstruction. This is consistent with previous spirometry readings.   Assessment and Plan: 1. Severe persistent asthma with acute exacerbation   2. Chest pain on breathing   3. Perennial allergic rhinitis   4. Gastroesophageal reflux disease, unspecified whether esophagitis present   5. Chronic urticaria   6. Asthma-COPD overlap syndrome  ([place])     Meds ordered this encounter  Medications  . DISCONTD: ipratropium-albuterol (DUONEB) 0.5-2.5 (3) MG/3ML SOLN    Sig: Take 3 mLs by nebulization every 4 (four) hours as needed.    Dispense:  75 mL    Refill:  1  . methylPREDNISolone acetate (DEPO-MEDROL) injection 80 mg  . ipratropium-albuterol (DUONEB) 0.5-2.5 (3) MG/3ML SOLN    Sig: Take 3 mLs by nebulization every 4 (four) hours as needed.    Dispense:  63 each    Refill:  1    Patient Instructions  Asthma Get a chest x-ray after you leave our clinic today Restart Breztri 2 puffs twice a day with a spacer to prevent cough or wheeze Begin DuoNeb via nebulizer once every 4 hours as needed for cough or wheeze Begin prednisone 10 mg tablets. Take 2 tablets twice a day for 3 days, then take 2 tablets once a day for 1 day, then take 1 tablet on the 5th day, then stop Depo-Medrol 80 IM injection given in the clinic today Continue Dupixent injections once every 2 weeks and have access to an epinephrine autoinjector set Call the clinic at any time with worsening respiratory symptoms or if you develop a fever  Allergic rhinitis Continue Xyzal 5 mg once a day as needed for runny nose Continue Flonase 2 sprays in each nostril once a day as needed for stuffy nose Consider saline nasal rinses as needed for nasal symptoms. Use this before any medicated nasal sprays for best result Continue allergen immunotherapy once a week and have access to an epinephrine auto-injector  Reflux Continue dietary and lifestyle modifications as listed below Continue pantoprazole 40 mg once a day as previously prescribed  Call the clinic if this treatment plan is not working well for you  Follow up in 1 week or sooner if needed.     Return in about 1 week (around 02/03/2021), or if symptoms worsen or fail to improve.    Thank you for the opportunity to care for this patient.  Please do not hesitate to contact me with questions.  Gareth Morgan,  FNP [facility]

## 2021-01-27 NOTE — Progress Notes (Signed)
Patient notified of cxr results and azithromycin ordered. Patient will call the clinic with worsening symptoms or with any questions.

## 2021-01-27 NOTE — Telephone Encounter (Signed)
Patient took second breathing treatment and still no improvement. Patient is coming in to see [name] today at [facility] in [place].

## 2021-01-27 NOTE — Telephone Encounter (Signed)
Please advice on any other recommendations for her flare up. We also have plenty of Breztri to give her.

## 2021-01-30 ENCOUNTER — Ambulatory Visit: Payer: Medicare HMO | Admitting: Adult Health

## 2021-01-31 NOTE — Telephone Encounter (Signed)
Nothing noted in message. Will close encounter.  

## 2021-02-01 ENCOUNTER — Other Ambulatory Visit: Payer: Self-pay

## 2021-02-01 ENCOUNTER — Telehealth (INDEPENDENT_AMBULATORY_CARE_PROVIDER_SITE_OTHER): Payer: Medicare HMO | Admitting: Psychiatry

## 2021-02-01 DIAGNOSIS — F3130 Bipolar disorder, current episode depressed, mild or moderate severity, unspecified: Secondary | ICD-10-CM

## 2021-02-01 MED ORDER — HYDROXYZINE HCL 50 MG PO TABS: 50.0000 mg | ORAL_TABLET | Freq: Every day | ORAL | 0 refills | Status: DC

## 2021-02-01 MED ORDER — CARBAMAZEPINE 200 MG PO TABS: 400.0000 mg | ORAL_TABLET | Freq: Two times a day (BID) | ORAL | 12 refills | Status: DC

## 2021-02-01 NOTE — Progress Notes (Signed)
[place] [address] Dept: [phone]  FOLLOW UP NOTE  Patient ID: [name], female    DOB: [birth year]  Age: 60 y.o. MRN: [id number] Date of Office Visit: 02/02/2021  Assessment  Chief Complaint: Asthma and Other (Sore throat and tongue pain has having some difficulty swallowing - since Tuesday )  HPI [name] is a 60 year old female who presents to the clinic for follow-up visit.  She was last seen in this clinic on 01/27/2021 for evaluation of asthma with acute exacerbation, allergic rhinitis, and reflux.  At today's visit she reports her asthma has been much more well controlled with no shortness of breath, cough, or wheeze with activity or rest.  She continues montelukast 10 mg once a day, Breztri 2 puffs twice a day with occasional spacer use, and has taken 1 DuoNeb treatment since we saw her last week Tuesday.  She continues Dupixent injections with no large local reactions.  She reports Dupixent has been instrumental in decreasing her symptoms of asthma.  She has recently felt well enough to begin a walking exercise program with the [facility].  Allergic rhinitis is reported as moderately well controlled with occasional clear rhinorrhea for which she takes Flonase as needed and Xyzal 5 mg once a day.  She continues allergen immunotherapy with no large local reactions.  She reports a significant decrease in her symptoms of allergic rhinitis while continuing on allergen immunotherapy.  She reports reflux as well controlled with no symptoms including heartburn or vomiting.  She continues famotidine 20 mg twice a day and has recently stopped pantoprazole.  At today's visit, she reports that her tongue and throat are sore which began on Tuesday.  She reports that these areas are red and have a burning quality to them.  Her current medications are listed in the chart.   Drug Allergies:  Allergies  Allergen Reactions  . Codeine Nausea And Vomiting  .  Sulfa Antibiotics Nausea And Vomiting  . Sulfonamide Derivatives Nausea And Vomiting    REACTION: n/v    Physical Exam: BP 134/80   Pulse 76   Temp 98.4 F (36.9 C)   Ht 5\' 3"  (1.6 m)   Wt 161 lb (73 kg)   SpO2 98%   BMI 28.52 kg/m    Physical Exam Vitals reviewed.  Constitutional:      Appearance: Normal appearance.  HENT:     Head: Normocephalic and atraumatic.     Right Ear: Tympanic membrane normal.     Left Ear: Tympanic membrane normal.     Nose:     Comments: Bilateral nares edematous and pale with clear nasal drainage noted.  Ears normal.  Eyes normal.  Tongue with erythema noted at the base.  No white patches noted in her tongue or throat. Eyes:     Conjunctiva/sclera: Conjunctivae normal.  Cardiovascular:     Rate and Rhythm: Normal rate and regular rhythm.     Heart sounds: Normal heart sounds. No murmur heard.   Pulmonary:     Effort: Pulmonary effort is normal.     Breath sounds: Normal breath sounds.     Comments: Lungs clear to auscultation Musculoskeletal:        General: Normal range of motion.     Cervical back: Normal range of motion and neck supple.  Skin:    General: Skin is warm and dry.  Neurological:     Mental Status: She is alert and oriented to person, place, and  time.  Psychiatric:        Mood and Affect: Mood normal.        Behavior: Behavior normal.        Thought Content: Thought content normal.        Judgment: Judgment normal.     Diagnostics: FVC 1.38, FEV1 0.75.  Predicted FVC 2.60, predicted FEV1 2.05.  Spirometry indicates moderate restriction and moderate airway obstruction.  This is consistent with previous spirometry readings.  Assessment and Plan: 1. Severe persistent asthma, unspecified whether complicated   2. Asthma-COPD overlap syndrome ([place])   3. Allergic rhinitis, unspecified seasonality, unspecified trigger   4. Gastroesophageal reflux disease, unspecified whether esophagitis present     Meds ordered this  encounter  Medications  . nystatin (MYCOSTATIN) 100000 UNIT/ML suspension    Sig: Take 5 mLs (500,000 Units total) by mouth daily for 7 days. every 6 hours for 7 days    Dispense:  60 mL    Refill:  0  . Budeson-Glycopyrrol-Formoterol (BREZTRI AEROSPHERE) 160-9-4.8 MCG/ACT AERO    Sig: INHALE 2 PUFFS INTO THE LUNGS TWICE DAILY WITH SPACER    Dispense:  10.7 g    Refill:  4    Order Specific Question:   Lot Number?    Answer:   [id number]    Order Specific Question:   Expiration Date?    Answer:   03/15/2022    Order Specific Question:   Manufacturer?    Answer:   [employer] [[id number]]    Order Specific Question:   Quantity    Answer:   1  . montelukast (SINGULAIR) 10 MG tablet    Sig: Take 1 tablet (10 mg total) by mouth at bedtime.    Dispense:  30 tablet    Refill:  6    Patient Instructions  Asthma Continue Breztri 2 puffs twice a day with a spacer to prevent cough or wheeze Continue DuoNeb via nebulizer once every 4 hours as needed for cough or wheeze Continue Dupixent injections once every 2 weeks and have access to an epinephrine autoinjector set Call the clinic at any time with worsening respiratory symptoms or if you develop a fever  Allergic rhinitis Continue Xyzal 5 mg once a day as needed for runny nose Continue Flonase 2 sprays in each nostril once a day as needed for stuffy nose Consider saline nasal rinses as needed for nasal symptoms. Use this before any medicated nasal sprays for best result Continue allergen avoidance measures directed toward dust mite as listed below Continue allergen immunotherapy once a week and have access to an epinephrine auto-injector  Reflux Continue dietary and lifestyle modifications as listed below Continue famotidine 20 mg twice a day as previously prescribed  Oral candidiasis Begin nystatin suspension 5 ml every 6 hours for 7 days Use a spacer with your Breztri inhaler and rinse your mouth after each use to prevent future  thrush  Call the clinic if this treatment plan is not working well for you  Follow up in 3 months or sooner if needed.   Return in about 3 months (around 05/02/2021), or if symptoms worsen or fail to improve.    Thank you for the opportunity to care for this patient.  Please do not hesitate to contact me with questions.  Gareth Morgan, FNP [facility]

## 2021-02-01 NOTE — Patient Instructions (Addendum)
Asthma Continue Breztri 2 puffs twice a day with a spacer to prevent cough or wheeze Continue DuoNeb via nebulizer once every 4 hours as needed for cough or wheeze Continue Dupixent injections once every 2 weeks and have access to an epinephrine autoinjector set Call the clinic at any time with worsening respiratory symptoms or if you develop a fever  Allergic rhinitis Continue Xyzal 5 mg once a day as needed for runny nose Continue Flonase 2 sprays in each nostril once a day as needed for stuffy nose Consider saline nasal rinses as needed for nasal symptoms. Use this before any medicated nasal sprays for best result Continue allergen avoidance measures directed toward dust mite as listed below Continue allergen immunotherapy once a week and have access to an epinephrine auto-injector  Reflux Continue dietary and lifestyle modifications as listed below Continue famotidine 20 mg twice a day as previously prescribed  Oral candidiasis Begin nystatin suspension 5 ml every 6 hours for 7 days Use a spacer with your Breztri inhaler and rinse your mouth after each use to prevent future thrush  Call the clinic if this treatment plan is not working well for you  Follow up in 3 months or sooner if needed.  Control of Dust Mite Allergen Dust mites play a major role in allergic asthma and rhinitis. They occur in environments with high humidity wherever human skin is found. Dust mites absorb humidity from the atmosphere (ie, they do not drink) and feed on organic matter (including shed human and animal skin). Dust mites are a microscopic type of insect that you cannot see with the naked eye. High levels of dust mites have been detected from mattresses, pillows, carpets, upholstered furniture, bed covers, clothes, soft toys and any woven material. The principal allergen of the dust mite is found in its feces. A gram of dust may contain 1,000 mites and 250,000 fecal particles. Mite antigen is easily  measured in the air during house cleaning activities. Dust mites do not bite and do not cause harm to humans, other than by triggering allergies/asthma.  Ways to decrease your exposure to dust mites in your home:  1. Encase mattresses, box springs and pillows with a mite-impermeable barrier or cover  2. Wash sheets, blankets and drapes weekly in hot water (130 F) with detergent and dry them in a dryer on the hot setting.  3. Have the room cleaned frequently with a vacuum cleaner and a damp dust-mop. For carpeting or rugs, vacuuming with a vacuum cleaner equipped with a high-efficiency particulate air (HEPA) filter. The dust mite allergic individual should not be in a room which is being cleaned and should wait 1 hour after cleaning before going into the room.  4. Do not sleep on upholstered furniture (eg, couches).  5. If possible removing carpeting, upholstered furniture and drapery from the home is ideal. Horizontal blinds should be eliminated in the rooms where the person spends the most time (bedroom, study, television room). Washable vinyl, roller-type shades are optimal.  6. Remove all non-washable stuffed toys from the bedroom. Wash stuffed toys weekly like sheets and blankets above.  7. Reduce indoor humidity to less than 50%. Inexpensive humidity monitors can be purchased at most hardware stores. Do not use a humidifier as can make the problem worse and are not recommended.   Lifestyle Changes for Controlling GERD When you have GERD, stomach acid feels as if it's backing up toward your mouth. Whether or not you take medication to control your GERD,  your symptoms can often be improved with lifestyle changes.   Raise Your Head  Reflux is more likely to strike when you're lying down flat, because stomach fluid can  flow backward more easily. Raising the head of your bed 4-6 inches can help. To do this:  Slide blocks or books under the legs at the head of your bed. Or, place a  wedge under  the mattress. Many foam stores can make a suitable wedge for you. The wedge  should run from your waist to the top of your head.  Don't just prop your head on several pillows. This increases pressure on your  stomach. It can make GERD worse.  Watch Your Eating Habits Certain foods may increase the acid in your stomach or relax the lower esophageal sphincter, making GERD more likely. It's best to avoid the following:  Coffee, tea, and carbonated drinks (with and without caffeine)  Fatty, fried, or spicy food  Mint, chocolate, onions, and tomatoes  Any other foods that seem to irritate your stomach or cause you pain  Relieve the Pressure  Eat smaller meals, even if you have to eat more often.  Don't lie down right after you eat. Wait a few hours for your stomach to empty.  Avoid tight belts and tight-fitting clothes.  Lose excess weight.  Tobacco and Alcohol  Avoid smoking tobacco and drinking alcohol. They can make GERD symptoms worse.
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
## 2021-02-02 ENCOUNTER — Encounter: Payer: Self-pay | Admitting: Family Medicine

## 2021-02-02 ENCOUNTER — Ambulatory Visit (INDEPENDENT_AMBULATORY_CARE_PROVIDER_SITE_OTHER): Payer: Medicare HMO | Admitting: Family Medicine

## 2021-02-02 ENCOUNTER — Other Ambulatory Visit: Payer: Self-pay

## 2021-02-02 VITALS — BP 134/80 | HR 76 | Temp 98.4°F | Ht 63.0 in | Wt 161.0 lb

## 2021-02-02 DIAGNOSIS — J449 Chronic obstructive pulmonary disease, unspecified: Secondary | ICD-10-CM

## 2021-02-02 DIAGNOSIS — K219 Gastro-esophageal reflux disease without esophagitis: Secondary | ICD-10-CM | POA: Diagnosis not present

## 2021-02-02 DIAGNOSIS — J4550 Severe persistent asthma, uncomplicated: Secondary | ICD-10-CM | POA: Diagnosis not present

## 2021-02-02 DIAGNOSIS — J309 Allergic rhinitis, unspecified: Secondary | ICD-10-CM

## 2021-02-02 MED ORDER — BREZTRI AEROSPHERE 160-9-4.8 MCG/ACT IN AERO: INHALATION_SPRAY | RESPIRATORY_TRACT | 4 refills | Status: DC

## 2021-02-02 MED ORDER — NYSTATIN 100000 UNIT/ML MT SUSP: 5.0000 mL | Freq: Every day | OROMUCOSAL | 0 refills | Status: DC

## 2021-02-02 MED ORDER — MONTELUKAST SODIUM 10 MG PO TABS: 10.0000 mg | ORAL_TABLET | Freq: Every day | ORAL | 6 refills | Status: DC

## 2021-02-03 ENCOUNTER — Other Ambulatory Visit (HOSPITAL_COMMUNITY): Payer: Self-pay | Admitting: Psychiatry

## 2021-02-03 MED ORDER — HYDROXYZINE HCL 50 MG PO TABS: 50.0000 mg | ORAL_TABLET | Freq: Every day | ORAL | 1 refills | Status: DC

## 2021-02-07 ENCOUNTER — Ambulatory Visit (INDEPENDENT_AMBULATORY_CARE_PROVIDER_SITE_OTHER): Payer: Medicare HMO | Admitting: *Deleted

## 2021-02-07 DIAGNOSIS — J4540 Moderate persistent asthma, uncomplicated: Secondary | ICD-10-CM | POA: Diagnosis not present

## 2021-02-14 ENCOUNTER — Other Ambulatory Visit: Payer: Self-pay | Admitting: Primary Care

## 2021-02-17 ENCOUNTER — Ambulatory Visit (INDEPENDENT_AMBULATORY_CARE_PROVIDER_SITE_OTHER): Payer: Medicare HMO | Admitting: *Deleted

## 2021-02-17 DIAGNOSIS — J309 Allergic rhinitis, unspecified: Secondary | ICD-10-CM | POA: Diagnosis not present

## 2021-02-21 ENCOUNTER — Ambulatory Visit (INDEPENDENT_AMBULATORY_CARE_PROVIDER_SITE_OTHER): Payer: Medicare HMO | Admitting: *Deleted

## 2021-02-21 ENCOUNTER — Other Ambulatory Visit: Payer: Self-pay

## 2021-02-21 DIAGNOSIS — J4540 Moderate persistent asthma, uncomplicated: Secondary | ICD-10-CM | POA: Diagnosis not present

## 2021-02-27 ENCOUNTER — Encounter: Payer: Self-pay | Admitting: Diagnostic Neuroimaging

## 2021-02-27 ENCOUNTER — Ambulatory Visit (INDEPENDENT_AMBULATORY_CARE_PROVIDER_SITE_OTHER): Payer: Medicare HMO | Admitting: Diagnostic Neuroimaging

## 2021-02-27 VITALS — BP 153/90 | HR 67 | Ht 63.0 in | Wt 160.8 lb

## 2021-02-27 DIAGNOSIS — I679 Cerebrovascular disease, unspecified: Secondary | ICD-10-CM | POA: Diagnosis not present

## 2021-02-27 DIAGNOSIS — G40909 Epilepsy, unspecified, not intractable, without status epilepticus: Secondary | ICD-10-CM

## 2021-02-27 NOTE — Patient Instructions (Signed)
-   continue risk factor control --> aspirin 81, BP, lipids control
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
## 2021-03-05 ENCOUNTER — Other Ambulatory Visit: Payer: Self-pay | Admitting: Allergy & Immunology

## 2021-03-05 DIAGNOSIS — L501 Idiopathic urticaria: Secondary | ICD-10-CM

## 2021-03-05 DIAGNOSIS — J4550 Severe persistent asthma, uncomplicated: Secondary | ICD-10-CM

## 2021-03-07 ENCOUNTER — Other Ambulatory Visit: Payer: Self-pay

## 2021-03-07 ENCOUNTER — Ambulatory Visit (INDEPENDENT_AMBULATORY_CARE_PROVIDER_SITE_OTHER): Payer: Medicare HMO | Admitting: *Deleted

## 2021-03-07 DIAGNOSIS — J4540 Moderate persistent asthma, uncomplicated: Secondary | ICD-10-CM

## 2021-03-09 ENCOUNTER — Other Ambulatory Visit: Payer: Self-pay | Admitting: Family Medicine

## 2021-03-10 MED ORDER — NYSTATIN 100000 UNIT/ML MT SUSP: OROMUCOSAL | 0 refills | Status: DC

## 2021-03-10 NOTE — Telephone Encounter (Signed)
Please make sure she is using a spacer with her inhaler and rinsing and spitting out after her inhaler use. OK to refill this prescription one more time. Thank you

## 2021-03-10 NOTE — Telephone Encounter (Signed)
Called and spoke to patient and confirmed that she is using the spacer with her inhaler. Patient stated that she will document what she's consuming to see if there is an allergy or wrong doing causing mouth discomfort. Patient was advised to bring notes with her to next appointment on 03/21/22. Patient agreed to do so. Refill has been sent in.

## 2021-03-10 NOTE — Telephone Encounter (Signed)
Patient called stating that her mouth is sore again and wanted to know if we cloud refill her oral medication. I informed patient that we would have to send a message to the provider since this isn't a daily medication. Patient verbalized understanding and aggred to wait for staff to respond on approval or denial or office visit appointment before approval.

## 2021-03-10 NOTE — Addendum Note (Signed)
Addended by: Clovis Cao A on: 03/10/2021 03:28 PM   Modules accepted: Orders

## 2021-03-17 ENCOUNTER — Ambulatory Visit (INDEPENDENT_AMBULATORY_CARE_PROVIDER_SITE_OTHER): Payer: Medicare HMO

## 2021-03-17 ENCOUNTER — Telehealth: Payer: Self-pay | Admitting: Allergy & Immunology

## 2021-03-17 DIAGNOSIS — J309 Allergic rhinitis, unspecified: Secondary | ICD-10-CM

## 2021-03-17 NOTE — Telephone Encounter (Signed)
Pt stated she has run out of her Breztri & would like some samples to pick up in the mean time. Her insurance will not cover a new refill until next month. Pt states she could pick up samples next Tuesday 4/5 when she comes in for her dupixent injection.  Please advise.

## 2021-03-17 NOTE — Telephone Encounter (Signed)
2 samples have been placed up front for the patient. Called the patient and advised, patient verbalized understanding.

## 2021-03-21 ENCOUNTER — Ambulatory Visit (INDEPENDENT_AMBULATORY_CARE_PROVIDER_SITE_OTHER): Payer: Medicare HMO | Admitting: *Deleted

## 2021-03-21 ENCOUNTER — Other Ambulatory Visit: Payer: Self-pay

## 2021-03-21 DIAGNOSIS — J4540 Moderate persistent asthma, uncomplicated: Secondary | ICD-10-CM

## 2021-04-04 ENCOUNTER — Other Ambulatory Visit: Payer: Self-pay | Admitting: Allergy & Immunology

## 2021-04-04 ENCOUNTER — Other Ambulatory Visit: Payer: Self-pay

## 2021-04-04 ENCOUNTER — Ambulatory Visit (INDEPENDENT_AMBULATORY_CARE_PROVIDER_SITE_OTHER): Payer: Medicare HMO | Admitting: *Deleted

## 2021-04-04 DIAGNOSIS — J4540 Moderate persistent asthma, uncomplicated: Secondary | ICD-10-CM | POA: Diagnosis not present

## 2021-04-11 ENCOUNTER — Encounter: Payer: Self-pay | Admitting: Internal Medicine

## 2021-04-11 ENCOUNTER — Other Ambulatory Visit: Payer: Self-pay

## 2021-04-11 ENCOUNTER — Ambulatory Visit (INDEPENDENT_AMBULATORY_CARE_PROVIDER_SITE_OTHER): Payer: Medicare HMO | Admitting: *Deleted

## 2021-04-11 ENCOUNTER — Ambulatory Visit (INDEPENDENT_AMBULATORY_CARE_PROVIDER_SITE_OTHER): Payer: Medicare HMO | Admitting: Internal Medicine

## 2021-04-11 VITALS — BP 140/86 | HR 67 | Temp 98.9°F | Ht 63.0 in | Wt 154.0 lb

## 2021-04-11 DIAGNOSIS — R030 Elevated blood-pressure reading, without diagnosis of hypertension: Secondary | ICD-10-CM

## 2021-04-11 DIAGNOSIS — R7303 Prediabetes: Secondary | ICD-10-CM

## 2021-04-11 DIAGNOSIS — E7800 Pure hypercholesterolemia, unspecified: Secondary | ICD-10-CM

## 2021-04-11 DIAGNOSIS — E538 Deficiency of other specified B group vitamins: Secondary | ICD-10-CM | POA: Diagnosis not present

## 2021-04-11 DIAGNOSIS — I10 Essential (primary) hypertension: Secondary | ICD-10-CM

## 2021-04-11 DIAGNOSIS — D508 Other iron deficiency anemias: Secondary | ICD-10-CM | POA: Diagnosis not present

## 2021-04-11 DIAGNOSIS — R29898 Other symptoms and signs involving the musculoskeletal system: Secondary | ICD-10-CM | POA: Diagnosis not present

## 2021-04-11 DIAGNOSIS — Z0001 Encounter for general adult medical examination with abnormal findings: Secondary | ICD-10-CM | POA: Diagnosis not present

## 2021-04-11 DIAGNOSIS — J309 Allergic rhinitis, unspecified: Secondary | ICD-10-CM | POA: Diagnosis not present

## 2021-04-11 DIAGNOSIS — E559 Vitamin D deficiency, unspecified: Secondary | ICD-10-CM

## 2021-04-11 DIAGNOSIS — W19XXXD Unspecified fall, subsequent encounter: Secondary | ICD-10-CM

## 2021-04-11 LAB — URINALYSIS, ROUTINE W REFLEX MICROSCOPIC
Bilirubin Urine: NEGATIVE
Hgb urine dipstick: NEGATIVE
Ketones, ur: NEGATIVE
Leukocytes,Ua: NEGATIVE
Nitrite: NEGATIVE
Specific Gravity, Urine: 1.01 (ref 1.000–1.030)
Urine Glucose: NEGATIVE
Urobilinogen, UA: 0.2 (ref 0.0–1.0)
pH: >=9 — AB (ref 5.0–8.0)

## 2021-04-11 LAB — TSH: TSH: 0.47 u[IU]/mL (ref 0.35–4.50)

## 2021-04-11 LAB — MICROALBUMIN / CREATININE URINE RATIO
Creatinine,U: 131.9 mg/dL
Microalb Creat Ratio: 0.7 mg/g (ref 0.0–30.0)
Microalb, Ur: 0.9 mg/dL (ref 0.0–1.9)

## 2021-04-11 LAB — CBC WITH DIFFERENTIAL/PLATELET
Basophils Absolute: 0 10*3/uL (ref 0.0–0.1)
Basophils Relative: 0.3 % (ref 0.0–3.0)
Eosinophils Absolute: 0.1 10*3/uL (ref 0.0–0.7)
Eosinophils Relative: 1.1 % (ref 0.0–5.0)
HCT: 26.8 % — ABNORMAL LOW (ref 36.0–46.0)
Hemoglobin: 8.5 g/dL — ABNORMAL LOW (ref 12.0–15.0)
Lymphocytes Relative: 25.3 % (ref 12.0–46.0)
Lymphs Abs: 2.4 10*3/uL (ref 0.7–4.0)
MCHC: 31.8 g/dL (ref 30.0–36.0)
MCV: 71.1 fl — ABNORMAL LOW (ref 78.0–100.0)
Monocytes Absolute: 0.8 10*3/uL (ref 0.1–1.0)
Monocytes Relative: 8.7 % (ref 3.0–12.0)
Neutro Abs: 6 10*3/uL (ref 1.4–7.7)
Neutrophils Relative %: 64.6 % (ref 43.0–77.0)
Platelets: 389 10*3/uL (ref 150.0–400.0)
RBC: 3.78 Mil/uL — ABNORMAL LOW (ref 3.87–5.11)
RDW: 17.1 % — ABNORMAL HIGH (ref 11.5–15.5)
WBC: 9.3 10*3/uL (ref 4.0–10.5)

## 2021-04-11 LAB — HEPATIC FUNCTION PANEL
ALT: 13 U/L (ref 0–35)
AST: 20 U/L (ref 0–37)
Albumin: 3.9 g/dL (ref 3.5–5.2)
Alkaline Phosphatase: 72 U/L (ref 39–117)
Bilirubin, Direct: 0.1 mg/dL (ref 0.0–0.3)
Total Bilirubin: 0.2 mg/dL (ref 0.2–1.2)
Total Protein: 6.8 g/dL (ref 6.0–8.3)

## 2021-04-11 LAB — BASIC METABOLIC PANEL
BUN: 10 mg/dL (ref 6–23)
CO2: 37 mEq/L — ABNORMAL HIGH (ref 19–32)
Calcium: 9.2 mg/dL (ref 8.4–10.5)
Chloride: 99 mEq/L (ref 96–112)
Creatinine, Ser: 0.71 mg/dL (ref 0.40–1.20)
GFR: 93.8 mL/min (ref >60.00)
Glucose, Bld: 73 mg/dL (ref 70–99)
Potassium: 2.9 mEq/L — ABNORMAL LOW (ref 3.5–5.1)
Sodium: 146 mEq/L — ABNORMAL HIGH (ref 135–145)

## 2021-04-11 LAB — LIPID PANEL
Cholesterol: 142 mg/dL (ref 0–200)
HDL: 64.3 mg/dL (ref >39.00)
LDL Cholesterol: 69 mg/dL (ref 0–99)
NonHDL: 78.12
Total CHOL/HDL Ratio: 2
Triglycerides: 47 mg/dL (ref 0.0–149.0)
VLDL: 9.4 mg/dL (ref 0.0–40.0)

## 2021-04-11 LAB — HEMOGLOBIN A1C: Hgb A1c MFr Bld: 6.2 % (ref 4.6–6.5)

## 2021-04-11 LAB — VITAMIN D 25 HYDROXY (VIT D DEFICIENCY, FRACTURES): VITD: 33.86 ng/mL (ref 30.00–100.00)

## 2021-04-11 LAB — IBC PANEL
Iron: 8 ug/dL — ABNORMAL LOW (ref 42–145)
Saturation Ratios: 1.9 % — ABNORMAL LOW (ref 20.0–50.0)
Transferrin: 297 mg/dL (ref 212.0–360.0)

## 2021-04-11 LAB — VITAMIN B12: Vitamin B-12: 330 pg/mL (ref 211–911)

## 2021-04-11 LAB — FERRITIN: Ferritin: 3.8 ng/mL — ABNORMAL LOW (ref 10.0–291.0)

## 2021-04-11 NOTE — Progress Notes (Signed)
VIAL EXP 04-11-22

## 2021-04-11 NOTE — Progress Notes (Signed)
Patient ID: [name], female   DOB: Apr 09, 1961, 60 y.o.   MRN: [id number]         Chief Complaint:: wellness exam and Dizziness  , fall and anemia       HPI:  [name] is a 60 y.o. female here for wellness exam; due for eye exam and pt plans to call, o/w up to date with preventive referrals and immunizations                        Also with 3 days onset low back pain across the back, mild to mod, constant, dull worse to stand and bend, better to sit.  Occurred after fall with legs just seemed to give out for now clear reason.  Has hx of anemia, hgb not checked recently.  Has ongoing menorrhagia.  Pt asks for neurolgoy referral.  Pt denies chest pain, increased sob or doe, wheezing, orthopnea, PND, increased LE swelling, palpitations, dizziness or syncope.   Pt denies polydipsia, polyuria, or other new focal neuro s/s.  Denies worsening depressive symptoms, suicidal ideation, or panic; has ongoing anxiety,  Pt denies fever, night sweats, loss of appetite, or other constitutional symptoms, but has lost several lbs recnetly for unclear reasons.   Wt Readings from Last 3 Encounters:  04/11/21 154 lb (69.9 kg)  02/27/21 160 lb 12.8 oz (72.9 kg)  02/02/21 161 lb (73 kg)   BP Readings from Last 3 Encounters:  04/11/21 140/86  02/27/21 (!) 153/90  02/02/21 134/80   Immunization History  Administered Date(s) Administered  . Influenza Split 10/23/2011  . Influenza Whole 09/16/2012  . Influenza,inj,Quad PF,6+ Mos 10/18/2014, 08/31/2015, 09/25/2016, 10/09/2017, 09/23/2018, 08/12/2019, 10/06/2020  . PFIZER(Purple Top)SARS-COV-2 Vaccination 02/24/2020, 03/16/2020, 10/29/2020  . Pneumococcal Polysaccharide-23 12/02/2015  . Td 04/22/2014   Health Maintenance Due  Topic Date Due  . OPHTHALMOLOGY EXAM  02/10/2020      Past Medical History:  Diagnosis Date  . ALLERGIC RHINITIS 05/12/2009   Qualifier: Diagnosis of  By: Lenn Cal Deborra Medina), Wynona Canes    . Anemia, iron deficiency  12/22/2014   pt. denies  . Anginal pain ([place])   . Anxiety   . Arthritis   . Asthma   . ASTHMA 05/12/2009   Severe AFL (Spirometry 05/2009: pre-BD FEV1 0.87L 34% pred, post-BD FEV1 1.11L 44% pred) Volumes hyperinflated Decreased DLCO that does not fully correct to normal range for alveolar volume.     . Bipolar disorder ([place])   . COPD 08/24/2009   Qualifier: Diagnosis of  By: Burnett Kanaris    . COPD (chronic obstructive pulmonary disease) ([place])   . Depression   . Dyspnea    sometimes when exerting self  . Fibromyalgia 05/14/2014  . GERD (gastroesophageal reflux disease)   . Headache   . History of kidney stones   . Hyperlipidemia 04/20/2017  . HYPERTENSION 05/12/2009   Qualifier: Diagnosis of  By: Lenn Cal Deborra Medina), Wynona Canes    . Kidney stones   . Peripheral vascular disease ([place])   . Pneumonia   . Prediabetes 02/23/2014   pt. denies  . Seizure ([place])   . Seizure ([place])   . Stroke ([place]) 11/2020  . Urticaria    Past Surgical History:  Procedure Laterality Date  . ABDOMINAL HYSTERECTOMY    . ANTERIOR CERVICAL DECOMP/DISCECTOMY FUSION N/A 07/28/2020   Procedure: ANTERIOR CERVICAL DECOMPRESSION/DISCECTOMY FUSION. INTERBODY PROTHESIS, PLATE/SCREWS CERVICAL THREE- CERVICAL FOUR, CERVICAL FOUR- CERVICAL FIVE;  Surgeon: Newman Pies, MD;  Location: [facility]  OR;  Service: Neurosurgery;  Laterality: N/A;  . BACK SURGERY    . COLONOSCOPY  12/20/2011   Procedure: COLONOSCOPY;  Surgeon: Landry Dyke, MD;  Location: [facility] ENDOSCOPY;  Service: Endoscopy;  Laterality: N/A;  . COLONOSCOPY  03/05/2012   Procedure: COLONOSCOPY;  Surgeon: Landry Dyke, MD;  Location: [facility] ENDOSCOPY;  Service: Endoscopy;  Laterality: N/A;  . DIAGNOSTIC LAPAROSCOPY    . HEMORRHOID SURGERY    . INCISE AND DRAIN ABCESS    . KIDNEY STONE SURGERY    . NECK SURGERY     x 2 Dr Orinda Kenner  . SPINE SURGERY  2019  . TOE SURGERY    . TUBAL LIGATION      reports that she quit smoking about 2 years ago. Her smoking use included  cigarettes. She has a 9.00 pack-year smoking history. She has never used smokeless tobacco. She reports previous alcohol use. She reports current drug use. Drug: Marijuana. family history includes Alcohol abuse in her brother and father; Anesthesia problems in her father; Asthma in her brother, mother, and sister; Brain cancer in her sister; COPD in her mother; Cancer in her father; Diabetes in her brother, father, mother, sister, and sister; Heart disease in her brother, mother, and sister; Hypertension in her sister; Kidney disease in her father; Sleep apnea in her brother. Allergies  Allergen Reactions  . Codeine Nausea And Vomiting  . Sulfa Antibiotics Nausea And Vomiting  . Sulfonamide Derivatives Nausea And Vomiting    REACTION: n/v  . Morphine Nausea And Vomiting   Current Outpatient Medications on File Prior to Visit  Medication Sig Dispense Refill  . albuterol (PROVENTIL) (2.5 MG/3ML) 0.083% nebulizer solution Take 3 mLs (2.5 mg total) by nebulization every 6 (six) hours as needed for wheezing or shortness of breath. Reported on 06/04/2016 120 mL 11  . albuterol (VENTOLIN HFA) 108 (90 Base) MCG/ACT inhaler INHALE 4 PUFFS INTO THE LUNGS EVERY 4 HOURS AS NEEDED FOR WHEEZING OR SHORTNESS OF BREATH 18 g 2  . aspirin EC 81 MG tablet Take 81 mg by mouth daily. Swallow whole.    Marland Kitchen atorvastatin (LIPITOR) 40 MG tablet Take 1 tablet (40 mg total) by mouth daily. 90 tablet 3  . Biotin 1000 MCG tablet Take 1,000 mcg by mouth daily.    . Budeson-Glycopyrrol-Formoterol (BREZTRI AEROSPHERE) 160-9-4.8 MCG/ACT AERO INHALE 2 PUFFS INTO THE LUNGS TWICE DAILY WITH SPACER 10.7 g 4  . carbamazepine (TEGRETOL) 200 MG tablet Take 2 tablets (400 mg total) by mouth 2 (two) times daily. 120 tablet 12  . cyclobenzaprine (FLEXERIL) 5 MG tablet TAKE 1 TABLET(5 MG) BY MOUTH THREE TIMES DAILY AS NEEDED FOR MUSCLE SPASMS 60 tablet 2  . doxepin (SINEQUAN) 25 MG capsule TAKE 1 CAPSULE(25 MG) BY MOUTH AT BEDTIME AS NEEDED  FOR ITCHING 30 capsule 2  . DUPIXENT 300 MG/2ML prefilled syringe INJECT 300MG  (1 SYRINGE) SUBCUTANEOUSLY EVERY 14 DAYS 4 mL 11  . estradiol (ESTRACE) 0.5 MG tablet TAKE 1 TABLET BY MOUTH DAILY 90 tablet 4  . famotidine (PEPCID) 20 MG tablet TAKE 1 TABLET(20 MG) BY MOUTH TWICE DAILY 64 tablet 5  . fluticasone (FLONASE) 50 MCG/ACT nasal spray SHAKE LIQUID AND USE 2 SPRAYS IN EACH NOSTRIL DAILY 16 g 5  . hydrochlorothiazide (MICROZIDE) 12.5 MG capsule TAKE 1 CAPSULE(12.5 MG) BY MOUTH DAILY 90 capsule 3  . hydrOXYzine (ATARAX/VISTARIL) 50 MG tablet Take 1 tablet (50 mg total) by mouth at bedtime. 90 tablet 1  . ipratropium-albuterol (DUONEB) 0.5-2.5 (  3) MG/3ML SOLN Take 3 mLs by nebulization every 6 (six) hours as needed. 60 mL 0  . irbesartan (AVAPRO) 75 MG tablet Take 1 tablet (75 mg total) by mouth daily. 90 tablet 3  . lactulose, encephalopathy, (GENERLAC) 10 GM/15ML SOLN TAKE 45 TO 90 ML( 30-60 GM) BY MOUTH TWICE DAILY AS NEEDED (Patient taking differently: Take 30-60 g by mouth 2 (two) times daily as needed (constipation). TAKE 45 TO 90 ML( 30-60 GM) BY MOUTH TWICE DAILY AS NEEDED) 473 mL 0  . montelukast (SINGULAIR) 10 MG tablet TAKE 1 TABLET(10 MG) BY MOUTH AT BEDTIME 30 tablet 2  . nystatin (MYCOSTATIN) 100000 UNIT/ML suspension SHAKE WELL AND GIVE 5 ML BY MOUTH EVERY 6 HOURS FOR 7 DAYS 60 mL 0  . UNKNOWN TO PATIENT Weekly allergy shots    . vitamin C (ASCORBIC ACID) 500 MG tablet Take 500 mg by mouth daily.     . Cholecalciferol (VITAMIN D) 125 MCG (5000 UT) CAPS Take 5,000 Units by mouth daily.    Marland Kitchen dextromethorphan-guaiFENesin (MUCINEX DM) 30-600 MG 12hr tablet Take 1 tablet by mouth 2 (two) times daily.    . diclofenac Sodium (VOLTAREN) 1 % GEL Apply 4 g topically 4 (four) times daily. (Patient taking differently: Apply 4 g topically 4 (four) times daily as needed (pain).) 100 g 0  . docusate sodium (COLACE) 100 MG capsule Take 1 capsule (100 mg total) by mouth 2 (two) times daily. 60  capsule 0  . hydrOXYzine (VISTARIL) 25 MG capsule     . oxyCODONE-acetaminophen (PERCOCET/ROXICET) 5-325 MG tablet Take 1-2 tablets by mouth every 4 (four) hours as needed for moderate pain. (Patient not taking: Reported on 04/11/2021) 30 tablet 0  . pantoprazole (PROTONIX) 40 MG tablet TAKE 1 TABLET BY MOUTH EVERY DAY 90 tablet 3  . triamcinolone (KENALOG) 0.1 % APPLY TOPICALLY TO THE AFFECTED AREA TWICE DAILY (Patient not taking: Reported on 04/11/2021) 30 g 0   Current Facility-Administered Medications on File Prior to Visit  Medication Dose Route Frequency Provider Last Rate Last Admin  . dupilumab (DUPIXENT) prefilled syringe 300 mg  300 mg Subcutaneous Q14 Days Valentina Shaggy, MD   300 mg at 04/04/21 1203        ROS:  All others reviewed and negative.  Objective        PE:  BP 140/86 (BP Location: Left Arm, Patient Position: Sitting, Cuff Size: Normal)   Pulse 67   Temp 98.9 F (37.2 C) (Oral)   Ht 5\' 3"  (1.6 m)   Wt 154 lb (69.9 kg)   SpO2 93%   BMI 27.28 kg/m                 Constitutional: Pt appears in NAD               HENT: Head: NCAT.                Right Ear: External ear normal.                 Left Ear: External ear normal.                Eyes: . Pupils are equal, round, and reactive to light. Conjunctivae and EOM are normal               Nose: without d/c or deformity               Neck: Neck supple. Gross normal ROM  Cardiovascular: Normal rate and regular rhythm.                 Pulmonary/Chest: Effort normal and breath sounds without rales or wheezing.                Abd:  Soft, NT, ND, + BS, no organomegaly               Neurological: Pt is alert. At baseline orientation, motor grossly intact               Skin: Skin is warm. No rashes, no other new lesions, LE edema - none               Psychiatric: Pt behavior is normal without agitation   Micro: none  Cardiac tracings I have personally interpreted today:  none  Pertinent Radiological  findings (summarize): none   Lab Results  Component Value Date   WBC 9.3 04/11/2021   HGB 8.5 Repeated and verified X2. (L) 04/11/2021   HCT 26.8 (L) 04/11/2021   PLT 389.0 04/11/2021   GLUCOSE 73 04/11/2021   CHOL 142 04/11/2021   TRIG 47.0 04/11/2021   HDL 64.30 04/11/2021   LDLCALC 69 04/11/2021   ALT 13 04/11/2021   AST 20 04/11/2021   NA 146 (H) 04/11/2021   K 2.9 (L) 04/11/2021   CL 99 04/11/2021   CREATININE 0.71 04/11/2021   BUN 10 04/11/2021   CO2 37 (H) 04/11/2021   TSH 0.47 04/11/2021   HGBA1C 6.2 04/11/2021   MICROALBUR 0.9 04/11/2021   Assessment/Plan:  [name] is a 60 y.o. Black or African American [2] female with  has a past medical history of ALLERGIC RHINITIS (05/12/2009), Anemia, iron deficiency (12/22/2014), Anginal pain ([place]), Anxiety, Arthritis, Asthma, ASTHMA (05/12/2009), Bipolar disorder ([place]), COPD (08/24/2009), COPD (chronic obstructive pulmonary disease) ([place]), Depression, Dyspnea, Fibromyalgia (05/14/2014), GERD (gastroesophageal reflux disease), Headache, History of kidney stones, Hyperlipidemia (04/20/2017), HYPERTENSION (05/12/2009), Kidney stones, Peripheral vascular disease ([place]), Pneumonia, Prediabetes (02/23/2014), Seizure ([place]), Seizure ([place]), Stroke ([place]) (11/2020), and Urticaria.  Encounter for well adult exam with abnormal findings Age and sex appropriate education and counseling updated with regular exercise and diet Referrals for preventative services - pt to call for eye exam Immunizations addressed - none needed Smoking counseling  - none needed Evidence for depression or other mood disorder - none significant Most recent labs reviewed. I have personally reviewed and have noted: 1) the patient's medical and social history 2) The patient's current medications and supplements 3) The patient's height, weight, and BMI have been recorded in the chart   Prediabetes Lab Results  Component Value Date   HGBA1C 6.2 04/11/2021   Stable,  pt to continue current medical treatment - diet   Hyperlipidemia Lab Results  Component Value Date   LDLCALC 69 04/11/2021   Stable, pt to continue current statin lipitor 40   Essential (primary) hypertension BP Readings from Last 3 Encounters:  04/11/21 140/86  02/27/21 (!) 153/90  02/02/21 134/80   Stable, pt to continue medical treatment - avapro, hct   Anemia, iron deficiency For f/u lab,  to f/u any worsening symptoms or concerns  Bilateral leg weakness With recent fall, and recent back pain, unclear etiology, for neurolgy referral  Followup: Return in about 6 months (around 10/11/2021).  Cathlean Cower, MD 04/17/2021 9:27 PM [facility]

## 2021-04-11 NOTE — Patient Instructions (Signed)
Please remember to call for your yearly eye exam.  You will be contacted regarding the referral for: Neurology for the falls and leg weakness  Please continue all other medications as before, and refills have been done if requested.  Please have the pharmacy call with any other refills you may need.  Please continue your efforts at being more active, low cholesterol diet, and weight control.  You are otherwise up to date with prevention measures today.  Please keep your appointments with your specialists as you may have planned  Please go to the LAB at the blood drawing area for the tests to be done  You will be contacted by phone if any changes need to be made immediately.  Otherwise, you will receive a letter about your results with an explanation, but please check with MyChart first.  Please remember to sign up for MyChart if you have not done so, as this will be important to you in the future with finding out test results, communicating by private email, and scheduling acute appointments online when needed.  Please make an Appointment to return in 6 months, or sooner if needed

## 2021-04-12 DIAGNOSIS — J3089 Other allergic rhinitis: Secondary | ICD-10-CM | POA: Diagnosis not present

## 2021-04-13 ENCOUNTER — Other Ambulatory Visit: Payer: Self-pay | Admitting: Internal Medicine

## 2021-04-13 ENCOUNTER — Encounter: Payer: Self-pay | Admitting: Internal Medicine

## 2021-04-13 DIAGNOSIS — Z1231 Encounter for screening mammogram for malignant neoplasm of breast: Secondary | ICD-10-CM

## 2021-04-13 DIAGNOSIS — Z0000 Encounter for general adult medical examination without abnormal findings: Secondary | ICD-10-CM

## 2021-04-13 DIAGNOSIS — D508 Other iron deficiency anemias: Secondary | ICD-10-CM

## 2021-04-13 MED ORDER — POTASSIUM CHLORIDE CRYS ER 10 MEQ PO TBCR: 10.0000 meq | EXTENDED_RELEASE_TABLET | Freq: Every day | ORAL | 3 refills | Status: DC

## 2021-04-14 ENCOUNTER — Other Ambulatory Visit (HOSPITAL_COMMUNITY): Payer: Self-pay | Admitting: Psychiatry

## 2021-04-14 ENCOUNTER — Telehealth: Payer: Self-pay | Admitting: Pharmacy Technician

## 2021-04-14 NOTE — Telephone Encounter (Signed)
  Auth Submission: Payer: [employer] Medication & CPT/J Code(s) submitted: Feraheme (ferumoxytol) [id number] Route of submission  PHONE: [phone] FAX: [phone] Units/visits requested: 2 (buy/bill) Reference number: [id number]  Will update once we receive a response.

## 2021-04-16 ENCOUNTER — Telehealth: Payer: Self-pay | Admitting: Allergy & Immunology

## 2021-04-17 ENCOUNTER — Encounter: Payer: Self-pay | Admitting: Internal Medicine

## 2021-04-17 ENCOUNTER — Other Ambulatory Visit: Payer: Self-pay | Admitting: Allergy & Immunology

## 2021-04-17 DIAGNOSIS — R29898 Other symptoms and signs involving the musculoskeletal system: Secondary | ICD-10-CM | POA: Insufficient documentation

## 2021-04-17 NOTE — Assessment & Plan Note (Signed)
BP Readings from Last 3 Encounters:  04/11/21 140/86  02/27/21 (!) 153/90  02/02/21 134/80   Stable, pt to continue medical treatment - avapro, hct

## 2021-04-17 NOTE — Telephone Encounter (Signed)
Patient scheduled an appointment for 5/31 at 10:30am with Dr. Ernst Bowler. Patient is coming into the office tomorrow for an injection and would like samples of Breztri.  Please advise.

## 2021-04-17 NOTE — Assessment & Plan Note (Signed)
Age and sex appropriate education and counseling updated with regular exercise and diet Referrals for preventative services - pt to call for eye exam Immunizations addressed - none needed Smoking counseling  - none needed Evidence for depression or other mood disorder - none significant Most recent labs reviewed. I have personally reviewed and have noted: 1) the patient's medical and social history 2) The patient's current medications and supplements 3) The patient's height, weight, and BMI have been recorded in the chart  

## 2021-04-17 NOTE — Telephone Encounter (Signed)
Last seen in february not due for a follow up appointment on 05/02/21

## 2021-04-17 NOTE — Telephone Encounter (Signed)
Samples have been placed up front in the [place] injection room for the patient to pick up. Called and advised to the patient. Patient verbalized understanding.

## 2021-04-17 NOTE — Assessment & Plan Note (Signed)
With recent fall, and recent back pain, unclear etiology, for neurolgy referral

## 2021-04-17 NOTE — Assessment & Plan Note (Signed)
Lab Results  Component Value Date   LDLCALC 69 04/11/2021   Stable, pt to continue current statin lipitor 40

## 2021-04-17 NOTE — Telephone Encounter (Signed)
Auth Submission: DENIED Payer: [employer] Medication & CPT/J Code(s) submitted; FERAHEME ([id number]) Route of submission (phone, fax, portal): PHONE/FAX Units/visits requested: 2 Reference number: [id number]  DENIED: Due to patient have not tried any of the following VENOFER, FERRILICIT, INFED with an inadequate response.  Can we initiate/start VENOFER? Please advise? Kim.

## 2021-04-17 NOTE — Assessment & Plan Note (Signed)
Lab Results  Component Value Date   HGBA1C 6.2 04/11/2021   Stable, pt to continue current medical treatment  - diet  

## 2021-04-17 NOTE — Assessment & Plan Note (Signed)
For fu lab,  to f/u any worsening symptoms or concerns  

## 2021-04-18 ENCOUNTER — Ambulatory Visit (INDEPENDENT_AMBULATORY_CARE_PROVIDER_SITE_OTHER): Payer: Medicare HMO | Admitting: *Deleted

## 2021-04-18 ENCOUNTER — Other Ambulatory Visit: Payer: Self-pay | Admitting: Allergy & Immunology

## 2021-04-18 ENCOUNTER — Other Ambulatory Visit: Payer: Self-pay

## 2021-04-18 DIAGNOSIS — J4540 Moderate persistent asthma, uncomplicated: Secondary | ICD-10-CM | POA: Diagnosis not present

## 2021-04-18 NOTE — Addendum Note (Signed)
Addended by: Biagio Borg on: 04/18/2021 08:17 AM   Modules accepted: Orders

## 2021-04-21 ENCOUNTER — Ambulatory Visit (INDEPENDENT_AMBULATORY_CARE_PROVIDER_SITE_OTHER): Payer: Medicare HMO | Admitting: *Deleted

## 2021-04-21 ENCOUNTER — Other Ambulatory Visit: Payer: Self-pay

## 2021-04-21 VITALS — BP 149/86 | HR 59 | Temp 98.3°F | Resp 20

## 2021-04-21 DIAGNOSIS — D508 Other iron deficiency anemias: Secondary | ICD-10-CM | POA: Diagnosis not present

## 2021-04-21 MED ORDER — DIPHENHYDRAMINE HCL 25 MG PO CAPS
50.0000 mg | ORAL_CAPSULE | Freq: Once | ORAL | Status: AC
Start: 2021-04-21 — End: 2021-04-21
  Administered 2021-04-21: 50 mg via ORAL
  Filled 2021-04-21: qty 2

## 2021-04-21 MED ORDER — SODIUM CHLORIDE 0.9 % IV SOLN
500.0000 mg | Freq: Once | INTRAVENOUS | Status: AC
  Administered 2021-04-21: 500 mg via INTRAVENOUS
  Filled 2021-04-21: qty 25

## 2021-04-21 MED ORDER — ACETAMINOPHEN 325 MG PO TABS
650.0000 mg | ORAL_TABLET | Freq: Once | ORAL | Status: AC
  Administered 2021-04-21: 650 mg via ORAL
  Filled 2021-04-21: qty 2

## 2021-04-21 NOTE — Progress Notes (Signed)
Diagnosis: Iron Deficiency Anemia  Provider:  Marshell Garfinkel, MD  Procedure: Infusion  IV Type: Peripheral, IV Location: L Hand  Venofer (Iron Sucrose), Dose: 500 mg  Infusion Start Time: 6578  Infusion Stop Time: 1600  Post Infusion IV Care: Observation period completed  Discharge: Condition: Good, Destination: home . AVS provided to patient.   Pt up to bathroom at 1400and returned short of breath. Pt used her ventolin-SatO2 at 100%  Performed by:  Bonnye Fava, RN

## 2021-04-28 ENCOUNTER — Other Ambulatory Visit: Payer: Self-pay | Admitting: Internal Medicine

## 2021-04-28 ENCOUNTER — Telehealth: Payer: Self-pay

## 2021-04-28 ENCOUNTER — Ambulatory Visit (INDEPENDENT_AMBULATORY_CARE_PROVIDER_SITE_OTHER): Payer: Medicare HMO | Admitting: Family Medicine

## 2021-04-28 ENCOUNTER — Other Ambulatory Visit: Payer: Self-pay

## 2021-04-28 ENCOUNTER — Encounter: Payer: Self-pay | Admitting: Family Medicine

## 2021-04-28 VITALS — BP 110/62 | HR 83 | Resp 16

## 2021-04-28 DIAGNOSIS — D649 Anemia, unspecified: Secondary | ICD-10-CM | POA: Diagnosis not present

## 2021-04-28 DIAGNOSIS — J449 Chronic obstructive pulmonary disease, unspecified: Secondary | ICD-10-CM | POA: Diagnosis not present

## 2021-04-28 DIAGNOSIS — K219 Gastro-esophageal reflux disease without esophagitis: Secondary | ICD-10-CM

## 2021-04-28 DIAGNOSIS — J3089 Other allergic rhinitis: Secondary | ICD-10-CM

## 2021-04-28 DIAGNOSIS — J4551 Severe persistent asthma with (acute) exacerbation: Secondary | ICD-10-CM

## 2021-04-28 MED ORDER — OLOPATADINE HCL 0.2 % OP SOLN: 1.0000 [drp] | OPHTHALMIC | 5 refills | Status: DC

## 2021-04-28 MED ORDER — CETIRIZINE HCL 10 MG PO TABS: ORAL_TABLET | ORAL | 5 refills | Status: DC

## 2021-04-28 NOTE — Progress Notes (Signed)
[name] [id number] Dept: [phone]  FOLLOW UP NOTE  Patient ID: [name], female    DOB: September 23, 1961  Age: 60 y.o. MRN: [id number] Date of Office Visit: 04/28/2021  Assessment  Chief Complaint: Asthma  HPI [name] is a 60 year old female who presents to the clinic for evaluation of acute asthma exacerbation.  She was last seen in this clinic on 01/27/2021 for evaluation of asthma with acute exacerbation requiring Depo-Medrol for relief of symptoms, allergic rhinitis, and reflux.  At today's visit, she reports that her asthma has been poorly controlled with symptoms beginning 5 days ago including chest tightness, shortness of breath, wheeze cough occurring mostly at night and occasionally in the day producing clear mucus.  She continues montelukast 10 mg once a day, Breztri 2 puffs twice a day with a spacer, and albuterol via nebulizer 2 times last night.  She continues Dupixent injections once every 2 weeks with no local reactions.  She reports that her symptoms of asthma have improved significantly while continuing on Dupixent.  Allergic rhinitis is reported as poorly controlled with symptoms beginning over the weekend including clear nasal drainage and copious postnasal drainage for which she continues Xyzal 5 mg once a day and Flonase as needed.  She is not currently using nasal saline rinse.  She continues allergen immunotherapy directed toward dust mite with no local reactions.  She reports a significant decrease in her symptoms of allergic rhinitis while continuing on allergen immunotherapy.  Allergic conjunctivitis is reported as poorly controlled with red, itchy, and puffy eyes occurring several days a week.  She is not currently using any medical intervention for allergic conjunctivitis.  Reflux is reported as poorly controlled with heartburn occurring several nights a week.  She continues famotidine 20 mg twice a day and pantoprazole 40 mg  once a day with mild relief of heartburn.  She denies vomiting.  She has an upcoming appointment for gastroenterology work-up.  She has recently received an iron infusion for microcytic anemia with hemoglobin 8.5 and hematocrit 26.8.  She will follow-up with her primary care provider Dr. Jenny [name], at [facility] for management of anemia.  Her current medications are listed in the chart.   Drug Allergies:  Allergies  Allergen Reactions  . Codeine Nausea And Vomiting  . Sulfa Antibiotics Nausea And Vomiting  . Sulfonamide Derivatives Nausea And Vomiting    REACTION: n/v  . Morphine Nausea And Vomiting    Physical Exam: BP 110/62   Pulse 83   Resp 16   SpO2 97%    Physical Exam Vitals reviewed.  Constitutional:      Appearance: Normal appearance.  HENT:     Head: Normocephalic and atraumatic.     Right Ear: Tympanic membrane normal.     Left Ear: Tympanic membrane normal.     Nose:     Comments: Bilateral nares normal.  Pharynx normal.  Ears normal.  Eyes normal.    Mouth/Throat:     Pharynx: Oropharynx is clear.  Eyes:     Conjunctiva/sclera: Conjunctivae normal.  Cardiovascular:     Rate and Rhythm: Normal rate and regular rhythm.     Heart sounds: Normal heart sounds. No murmur heard.   Pulmonary:     Effort: Pulmonary effort is normal.     Breath sounds: Normal breath sounds.     Comments: Lungs clear to auscultation Musculoskeletal:        General: Normal range  of motion.     Cervical back: Normal range of motion and neck supple.  Skin:    General: Skin is warm and dry.  Neurological:     Mental Status: She is alert and oriented to person, place, and time.  Psychiatric:        Mood and Affect: Mood normal.        Behavior: Behavior normal.        Thought Content: Thought content normal.        Judgment: Judgment normal.     Diagnostics: FVC 2.02, FEV1 0.91.  Predicted FVC 2.60, predicted FEV1 2.05.  Spirometry indicates mild restriction  and moderate airway obstruction.  Postbronchodilator FVC 1.75, FEV1 0.79.  Postbronchodilator spirometry indicates mild restriction with moderate airway obstruction with no significant bronchodilator response.  This is consistent with previous spirometry readings.  Assessment and Plan: 1. Severe persistent asthma with acute exacerbation   2. Asthma-COPD overlap syndrome ([place])   3. Perennial allergic rhinitis   4. Gastroesophageal reflux disease, unspecified whether esophagitis present   5. Anemia, unspecified type     Meds ordered this encounter  Medications  . cetirizine (ZYRTEC) 10 MG tablet    Sig: Can take one tablet by mouth once daily if needed for runny nose.    Dispense:  30 tablet    Refill:  5    Please let patient know if the OTC cetirizine is cheaper than this RX.  Thank you- HKR  . Olopatadine HCl (PATADAY) 0.2 % SOLN    Sig: Place 1 drop into both eyes 1 day or 1 dose.    Dispense:  2.5 mL    Refill:  5    Patient Instructions  Asthma Begin prednisone 10 mg tablets. Take 2 tablets twice a day for 3 days, then take 2 tablets once a day for 1 day, then take 1 tablet on the 5th day, then stop Begin Tezspire injections once a month. This will replace Dupixent. You will hear from our biologics coordinator, Tammy next. Continue with Dupixent injections until Belenda Cruise is approved Continue Breztri 2 puffs twice a day with a spacer to prevent cough or wheeze Continue DuoNeb via nebulizer once every 4 hours as needed for cough or wheeze Call the clinic at any time with worsening respiratory symptoms or if you develop a fever  Allergic rhinitis Begin cetirizine 10 mg once a day as needed for runny nose. This will replace Xyzal Continue Flonase 2 sprays in each nostril once a day as needed for stuffy nose Consider saline nasal rinses as needed for nasal symptoms. Use this before any medicated nasal sprays for best result Continue allergen avoidance measures directed toward dust  mite as listed below Continue allergen immunotherapy once a week (next week if you are feeling better) and have access to an epinephrine auto-injector  Reflux Continue pantoprazole 40 mg once a day as previously prescribed Continue famotidine 20 mg twice a day as previously prescribed Continue dietary and lifestyle modifications as listed below  Anemia Continue to follow up with your primary care provider for evaluation of anemia  Call the clinic if this treatment plan is not working well for you  Follow up in 1 month or sooner if needed.   Return in about 4 weeks (around 05/26/2021), or if symptoms worsen or fail to improve.    Thank you for the opportunity to care for this patient.  Please do not hesitate to contact me with questions.  Gareth Morgan, FNP [facility]

## 2021-04-28 NOTE — Telephone Encounter (Signed)
Felicia Morgan, FNP would like patient to start Tezspire.  She has signed the "Patient Consent for Submission and Administration" form and the Fast Start form.  Please help.

## 2021-04-28 NOTE — Patient Instructions (Addendum)
Asthma Begin prednisone 10 mg tablets. Take 2 tablets twice a day for 3 days, then take 2 tablets once a day for 1 day, then take 1 tablet on the 5th day, then stop Begin Tezspire injections once a month. This will replace Dupixent. You will hear from our biologics coordinator, Tammy next. Continue with Dupixent injections until Belenda Cruise is approved Continue Breztri 2 puffs twice a day with a spacer to prevent cough or wheeze Continue DuoNeb via nebulizer once every 4 hours as needed for cough or wheeze Call the clinic at any time with worsening respiratory symptoms or if you develop a fever  Allergic rhinitis Begin cetirizine 10 mg once a day as needed for runny nose. This will replace Xyzal Continue Flonase 2 sprays in each nostril once a day as needed for stuffy nose Consider saline nasal rinses as needed for nasal symptoms. Use this before any medicated nasal sprays for best result Continue allergen avoidance measures directed toward dust mite as listed below Continue allergen immunotherapy once a week (next week if you are feeling better) and have access to an epinephrine auto-injector  Reflux Continue pantoprazole 40 mg once a day as previously prescribed Continue famotidine 20 mg twice a day as previously prescribed Continue dietary and lifestyle modifications as listed below  Anemia Continue to follow up with your primary care provider for evaluation of anemia  Call the clinic if this treatment plan is not working well for you  Follow up in 1 month or sooner if needed.  Control of Dust Mite Allergen Dust mites play a major role in allergic asthma and rhinitis. They occur in environments with high humidity wherever human skin is found. Dust mites absorb humidity from the atmosphere (ie, they do not drink) and feed on organic matter (including shed human and animal skin). Dust mites are a microscopic type of insect that you cannot see with the naked eye. High levels of dust mites  have been detected from mattresses, pillows, carpets, upholstered furniture, bed covers, clothes, soft toys and any woven material. The principal allergen of the dust mite is found in its feces. A gram of dust may contain 1,000 mites and 250,000 fecal particles. Mite antigen is easily measured in the air during house cleaning activities. Dust mites do not bite and do not cause harm to humans, other than by triggering allergies/asthma.  Ways to decrease your exposure to dust mites in your home:  1. Encase mattresses, box springs and pillows with a mite-impermeable barrier or cover  2. Wash sheets, blankets and drapes weekly in hot water (130 F) with detergent and dry them in a dryer on the hot setting.  3. Have the room cleaned frequently with a vacuum cleaner and a damp dust-mop. For carpeting or rugs, vacuuming with a vacuum cleaner equipped with a high-efficiency particulate air (HEPA) filter. The dust mite allergic individual should not be in a room which is being cleaned and should wait 1 hour after cleaning before going into the room.  4. Do not sleep on upholstered furniture (eg, couches).  5. If possible removing carpeting, upholstered furniture and drapery from the home is ideal. Horizontal blinds should be eliminated in the rooms where the person spends the most time (bedroom, study, television room). Washable vinyl, roller-type shades are optimal.  6. Remove all non-washable stuffed toys from the bedroom. Wash stuffed toys weekly like sheets and blankets above.  7. Reduce indoor humidity to less than 50%. Inexpensive humidity monitors can be purchased at  most hardware stores. Do not use a humidifier as can make the problem worse and are not recommended.   Lifestyle Changes for Controlling GERD When you have GERD, stomach acid feels as if it's backing up toward your mouth. Whether or not you take medication to control your GERD, your symptoms can often be improved with lifestyle  changes.   Raise Your Head  Reflux is more likely to strike when you're lying down flat, because stomach fluid can  flow backward more easily. Raising the head of your bed 4-6 inches can help. To do this:  Slide blocks or books under the legs at the head of your bed. Or, place a wedge under  the mattress. Many foam stores can make a suitable wedge for you. The wedge  should run from your waist to the top of your head.  Don't just prop your head on several pillows. This increases pressure on your  stomach. It can make GERD worse.  Watch Your Eating Habits Certain foods may increase the acid in your stomach or relax the lower esophageal sphincter, making GERD more likely. It's best to avoid the following:  Coffee, tea, and carbonated drinks (with and without caffeine)  Fatty, fried, or spicy food  Mint, chocolate, onions, and tomatoes  Any other foods that seem to irritate your stomach or cause you pain  Relieve the Pressure  Eat smaller meals, even if you have to eat more often.  Don't lie down right after you eat. Wait a few hours for your stomach to empty.  Avoid tight belts and tight-fitting clothes.  Lose excess weight.  Tobacco and Alcohol  Avoid smoking tobacco and drinking alcohol. They can make GERD symptoms worse.

## 2021-05-01 ENCOUNTER — Encounter: Payer: Self-pay | Admitting: Nurse Practitioner

## 2021-05-02 ENCOUNTER — Ambulatory Visit (INDEPENDENT_AMBULATORY_CARE_PROVIDER_SITE_OTHER): Payer: Medicare HMO | Admitting: *Deleted

## 2021-05-02 ENCOUNTER — Other Ambulatory Visit: Payer: Self-pay

## 2021-05-02 DIAGNOSIS — J4540 Moderate persistent asthma, uncomplicated: Secondary | ICD-10-CM | POA: Diagnosis not present

## 2021-05-02 NOTE — Telephone Encounter (Signed)
L/m for patient to contact me to advise approval and submit for [employer]

## 2021-05-02 NOTE — Telephone Encounter (Signed)
Called patient and advised approval for Tezspire and d/c Dupixent and will reach back out to patient once delivery set to advise to make appt to start therapy

## 2021-05-04 NOTE — Telephone Encounter (Signed)
Excellent!   Salvatore Marvel, MD [facility]

## 2021-05-05 ENCOUNTER — Emergency Department (HOSPITAL_COMMUNITY): Payer: Medicare HMO

## 2021-05-05 ENCOUNTER — Other Ambulatory Visit: Payer: Self-pay

## 2021-05-05 ENCOUNTER — Emergency Department (HOSPITAL_COMMUNITY)
Admission: EM | Admit: 2021-05-05 | Discharge: 2021-05-05 | Disposition: A | Discharge status: Home / Self Care | Payer: Medicare HMO | Attending: Emergency Medicine | Admitting: Emergency Medicine

## 2021-05-05 ENCOUNTER — Ambulatory Visit (INDEPENDENT_AMBULATORY_CARE_PROVIDER_SITE_OTHER): Payer: Medicare HMO

## 2021-05-05 ENCOUNTER — Ambulatory Visit: Payer: Medicare HMO | Admitting: Internal Medicine

## 2021-05-05 ENCOUNTER — Encounter (HOSPITAL_COMMUNITY): Payer: Self-pay

## 2021-05-05 VITALS — BP 106/70 | HR 65 | Temp 98.7°F | Resp 18 | Wt 148.8 lb

## 2021-05-05 DIAGNOSIS — N281 Cyst of kidney, acquired: Secondary | ICD-10-CM | POA: Diagnosis not present

## 2021-05-05 DIAGNOSIS — Z79899 Other long term (current) drug therapy: Secondary | ICD-10-CM | POA: Insufficient documentation

## 2021-05-05 DIAGNOSIS — R300 Dysuria: Secondary | ICD-10-CM | POA: Insufficient documentation

## 2021-05-05 DIAGNOSIS — Z87891 Personal history of nicotine dependence: Secondary | ICD-10-CM | POA: Insufficient documentation

## 2021-05-05 DIAGNOSIS — J45909 Unspecified asthma, uncomplicated: Secondary | ICD-10-CM | POA: Insufficient documentation

## 2021-05-05 DIAGNOSIS — R519 Headache, unspecified: Secondary | ICD-10-CM | POA: Diagnosis not present

## 2021-05-05 DIAGNOSIS — Z7982 Long term (current) use of aspirin: Secondary | ICD-10-CM | POA: Diagnosis not present

## 2021-05-05 DIAGNOSIS — D508 Other iron deficiency anemias: Secondary | ICD-10-CM | POA: Diagnosis not present

## 2021-05-05 DIAGNOSIS — Z7951 Long term (current) use of inhaled steroids: Secondary | ICD-10-CM | POA: Insufficient documentation

## 2021-05-05 DIAGNOSIS — R110 Nausea: Secondary | ICD-10-CM | POA: Diagnosis not present

## 2021-05-05 DIAGNOSIS — I10 Essential (primary) hypertension: Secondary | ICD-10-CM | POA: Insufficient documentation

## 2021-05-05 DIAGNOSIS — J449 Chronic obstructive pulmonary disease, unspecified: Secondary | ICD-10-CM | POA: Insufficient documentation

## 2021-05-05 DIAGNOSIS — R1111 Vomiting without nausea: Secondary | ICD-10-CM | POA: Diagnosis not present

## 2021-05-05 DIAGNOSIS — Z20822 Contact with and (suspected) exposure to covid-19: Secondary | ICD-10-CM | POA: Insufficient documentation

## 2021-05-05 DIAGNOSIS — R1084 Generalized abdominal pain: Secondary | ICD-10-CM | POA: Diagnosis not present

## 2021-05-05 DIAGNOSIS — K219 Gastro-esophageal reflux disease without esophagitis: Secondary | ICD-10-CM | POA: Insufficient documentation

## 2021-05-05 DIAGNOSIS — R109 Unspecified abdominal pain: Secondary | ICD-10-CM | POA: Insufficient documentation

## 2021-05-05 DIAGNOSIS — R531 Weakness: Secondary | ICD-10-CM | POA: Insufficient documentation

## 2021-05-05 DIAGNOSIS — I499 Cardiac arrhythmia, unspecified: Secondary | ICD-10-CM | POA: Diagnosis not present

## 2021-05-05 LAB — URINALYSIS, ROUTINE W REFLEX MICROSCOPIC
Bilirubin Urine: NEGATIVE
Glucose, UA: NEGATIVE mg/dL
Hgb urine dipstick: NEGATIVE
Ketones, ur: NEGATIVE mg/dL
Leukocytes,Ua: NEGATIVE
Nitrite: NEGATIVE
Protein, ur: NEGATIVE mg/dL
Specific Gravity, Urine: 1.012 (ref 1.005–1.030)
pH: 5 (ref 5.0–8.0)

## 2021-05-05 LAB — CBC
HCT: 43.5 % (ref 36.0–46.0)
Hemoglobin: 13 g/dL (ref 12.0–15.0)
MCH: 23.4 pg — ABNORMAL LOW (ref 26.0–34.0)
MCHC: 29.9 g/dL — ABNORMAL LOW (ref 30.0–36.0)
MCV: 78.2 fL — ABNORMAL LOW (ref 80.0–100.0)
Platelets: 545 10*3/uL — ABNORMAL HIGH (ref 150–400)
RBC: 5.56 MIL/uL — ABNORMAL HIGH (ref 3.87–5.11)
RDW: 25.2 % — ABNORMAL HIGH (ref 11.5–15.5)
WBC: 15.6 10*3/uL — ABNORMAL HIGH (ref 4.0–10.5)
nRBC: 0 % (ref 0.0–0.2)

## 2021-05-05 LAB — COMPREHENSIVE METABOLIC PANEL
ALT: 19 U/L (ref 0–44)
AST: 30 U/L (ref 15–41)
Albumin: 3.7 g/dL (ref 3.5–5.0)
Alkaline Phosphatase: 66 U/L (ref 38–126)
Anion gap: 10 (ref 5–15)
BUN: 18 mg/dL (ref 6–20)
CO2: 22 mmol/L (ref 22–32)
Calcium: 8.6 mg/dL — ABNORMAL LOW (ref 8.9–10.3)
Chloride: 99 mmol/L (ref 98–111)
Creatinine, Ser: 0.93 mg/dL (ref 0.44–1.00)
GFR, Estimated: >60 mL/min (ref >60)
Glucose, Bld: 140 mg/dL — ABNORMAL HIGH (ref 70–99)
Potassium: 4.7 mmol/L (ref 3.5–5.1)
Sodium: 131 mmol/L — ABNORMAL LOW (ref 135–145)
Total Bilirubin: 0.8 mg/dL (ref 0.3–1.2)
Total Protein: 6.2 g/dL — ABNORMAL LOW (ref 6.5–8.1)

## 2021-05-05 LAB — RESP PANEL BY RT-PCR (FLU A&B, COVID) ARPGX2
Influenza A by PCR: NEGATIVE
Influenza B by PCR: NEGATIVE
SARS Coronavirus 2 by RT PCR: NEGATIVE

## 2021-05-05 LAB — I-STAT BETA HCG BLOOD, ED (MC, WL, AP ONLY): I-stat hCG, quantitative: <5 m[IU]/mL (ref <5)

## 2021-05-05 LAB — LIPASE, BLOOD: Lipase: 39 U/L (ref 11–51)

## 2021-05-05 MED ORDER — SODIUM CHLORIDE 0.9 % IV SOLN
500.0000 mg | Freq: Once | INTRAVENOUS | Status: AC
  Administered 2021-05-05: 500 mg via INTRAVENOUS
  Filled 2021-05-05: qty 25

## 2021-05-05 MED ORDER — HEPARIN SOD (PORK) LOCK FLUSH 100 UNIT/ML IV SOLN: 250.0000 [IU] | Freq: Once | INTRAVENOUS | Status: DC | PRN

## 2021-05-05 MED ORDER — METHYLPREDNISOLONE SODIUM SUCC 125 MG IJ SOLR: 125.0000 mg | Freq: Once | INTRAMUSCULAR | Status: DC | PRN

## 2021-05-05 MED ORDER — DIPHENHYDRAMINE HCL 50 MG/ML IJ SOLN
50.0000 mg | Freq: Once | INTRAMUSCULAR | Status: AC
  Administered 2021-05-05: 50 mg via INTRAVENOUS
  Filled 2021-05-05: qty 1

## 2021-05-05 MED ORDER — SODIUM CHLORIDE 0.9% FLUSH: 3.0000 mL | Freq: Once | INTRAVENOUS | Status: DC | PRN

## 2021-05-05 MED ORDER — LACTATED RINGERS IV BOLUS
1000.0000 mL | Freq: Once | INTRAVENOUS | Status: AC
  Administered 2021-05-05: 1000 mL via INTRAVENOUS

## 2021-05-05 MED ORDER — ANTICOAGULANT SODIUM CITRATE 4% (200MG/5ML) IV SOLN
5.0000 mL | Freq: Once | Status: DC | PRN
  Filled 2021-05-05: qty 5

## 2021-05-05 MED ORDER — HEPARIN SOD (PORK) LOCK FLUSH 100 UNIT/ML IV SOLN: 500.0000 [IU] | Freq: Once | INTRAVENOUS | Status: DC | PRN

## 2021-05-05 MED ORDER — ALBUTEROL SULFATE HFA 108 (90 BASE) MCG/ACT IN AERS: 2.0000 | INHALATION_SPRAY | Freq: Once | RESPIRATORY_TRACT | Status: DC | PRN

## 2021-05-05 MED ORDER — DIPHENHYDRAMINE HCL 25 MG PO CAPS
50.0000 mg | ORAL_CAPSULE | Freq: Once | ORAL | Status: AC
  Administered 2021-05-05: 50 mg via ORAL
  Filled 2021-05-05: qty 2

## 2021-05-05 MED ORDER — KETOROLAC TROMETHAMINE 15 MG/ML IJ SOLN
15.0000 mg | Freq: Once | INTRAMUSCULAR | Status: AC
  Administered 2021-05-05: 15 mg via INTRAVENOUS
  Filled 2021-05-05: qty 1

## 2021-05-05 MED ORDER — ACETAMINOPHEN 325 MG PO TABS
650.0000 mg | ORAL_TABLET | Freq: Once | ORAL | Status: AC
  Administered 2021-05-05: 650 mg via ORAL
  Filled 2021-05-05: qty 2

## 2021-05-05 MED ORDER — ALTEPLASE 2 MG IJ SOLR: 2.0000 mg | Freq: Once | INTRAMUSCULAR | Status: DC | PRN

## 2021-05-05 MED ORDER — EPINEPHRINE 0.3 MG/0.3ML IJ SOAJ: 0.3000 mg | Freq: Once | INTRAMUSCULAR | Status: DC | PRN

## 2021-05-05 MED ORDER — FAMOTIDINE IN NACL 20-0.9 MG/50ML-% IV SOLN: 20.0000 mg | Freq: Once | INTRAVENOUS | Status: DC | PRN

## 2021-05-05 MED ORDER — SODIUM CHLORIDE 0.9% FLUSH: 10.0000 mL | Freq: Once | INTRAVENOUS | Status: DC | PRN

## 2021-05-05 MED ORDER — PROCHLORPERAZINE EDISYLATE 10 MG/2ML IJ SOLN
5.0000 mg | Freq: Once | INTRAMUSCULAR | Status: AC
  Administered 2021-05-05: 5 mg via INTRAVENOUS
  Filled 2021-05-05: qty 2

## 2021-05-05 MED ORDER — DIPHENHYDRAMINE HCL 50 MG/ML IJ SOLN: 50.0000 mg | Freq: Once | INTRAMUSCULAR | Status: DC | PRN

## 2021-05-05 MED ORDER — SODIUM CHLORIDE 0.9 % IV SOLN: Freq: Once | INTRAVENOUS | Status: DC | PRN

## 2021-05-05 NOTE — ED Triage Notes (Signed)
Brought in by [facility] from home,    Pt went to PCP today for low iron levels pt got blood transfusion and iron infusion. Pt got dizzy and generalized weakness, nausea after visit.   Vomiting x 1. Generalized abdominal pain. bp 100/80 254ml fluids. G.18 left ac.

## 2021-05-05 NOTE — Progress Notes (Signed)
Diagnosis: Iron Deficiency Anemia  Provider:  Marshell Garfinkel, MD  Procedure: Infusion  IV Type: Peripheral, IV Location: L Forearm  Venofer (Iron Sucrose), Dose: 500 mg  Infusion Start Time: 05/05/21 1205  Infusion Stop Time: 05/05/21 1615  Post Infusion IV Care: Observation period completed  Discharge: Condition: Good, Destination: Home . AVS provided to patient.   Performed by:  Jonelle Sidle, RN

## 2021-05-05 NOTE — Discharge Instructions (Addendum)
Please come back for severe headache, nausea or vomiting, vision changes, passing out, repeat onset ofa bdominal pain, or other emergencies.

## 2021-05-05 NOTE — ED Provider Notes (Signed)
[name] Provider Note   CSN: [id number] Arrival date & time:        History Chief Complaint  Patient presents with  . Emesis  . Weakness    [name] is a 60 y.o. female.  HPI patient presents with weakness, abdominal pain, headache.  Symptoms started about an hour prior to arrival.  She had an iron infusion this morning which she has had in the past without reaction and she says that she felt fine in general afterwards and she has been feeling fine previously to this.  Her headache feels like a typical ache all over her head that she has had in the past.  She is not sure what precipitated the symptoms.  She ate at home and then had 2 episodes of emesis which are nonbloody nonbilious.  Her pain in her abdomen is low, poorly differential below in terms of location, and feels like "I am going to have a baby."  History of kidney stones but this was this and she cannot recall what it felt like.  She did have dysuria today.  No fevers or chills.  She does feel her symptoms have gradually improved since onset.     Past Medical History:  Diagnosis Date  . ALLERGIC RHINITIS 05/12/2009   Qualifier: Diagnosis of  By: Lenn Cal Deborra Medina), Wynona Canes    . Anemia, iron deficiency 12/22/2014   pt. denies  . Anginal pain ([place])   . Anxiety   . Arthritis   . Asthma   . ASTHMA 05/12/2009   Severe AFL (Spirometry 05/2009: pre-BD FEV1 0.87L 34% pred, post-BD FEV1 1.11L 44% pred) Volumes hyperinflated Decreased DLCO that does not fully correct to normal range for alveolar volume.     . Bipolar disorder ([place])   . COPD 08/24/2009   Qualifier: Diagnosis of  By: Burnett Kanaris    . COPD (chronic obstructive pulmonary disease) ([place])   . Depression   . Dyspnea    sometimes when exerting self  . Fibromyalgia 05/14/2014  . GERD (gastroesophageal reflux disease)   . Headache   . History of kidney stones   . Hyperlipidemia 04/20/2017  . HYPERTENSION 05/12/2009    Qualifier: Diagnosis of  By: Lenn Cal Deborra Medina), Wynona Canes    . Kidney stones   . Peripheral vascular disease ([place])   . Pneumonia   . Prediabetes 02/23/2014   pt. denies  . Seizure ([place])   . Seizure ([place])   . Stroke ([place]) 11/2020  . Urticaria     Patient Active Problem List   Diagnosis Date Noted  . Bilateral leg weakness 04/17/2021  . Status post cervical spinal fusion 12/27/2020  . History of stroke 12/11/2020  . Medication management 10/21/2020  . Body mass index (BMI) 28.0-28.9, adult 08/23/2020  . Healthcare maintenance 08/11/2020  . Cervical spondylosis with myelopathy and radiculopathy 07/28/2020  . Preop exam for internal medicine 06/19/2020  . Hypotension due to medication 06/11/2020  . Status asthmaticus 06/09/2020  . Essential (primary) hypertension 05/17/2020  . Other spondylosis with myelopathy, cervical region 02/16/2020  . Cervical spondylosis 02/02/2020  . Radiculopathy, cervical region 02/02/2020  . Spondylolysis, cervical region 02/02/2020  . Neck pain 02/02/2020  . Leg cramping 08/12/2019  . Laryngopharyngeal reflux (LPR) 05/26/2019  . Sinus pressure 05/26/2019  . Hypophosphatemia 01/07/2019  . Vertigo 09/23/2018  . Dysfunction of left eustachian tube 09/23/2018  . Gait disorder 04/21/2018  . Leukocytosis 04/15/2018  . Nausea & vomiting 04/15/2018  . Seizure ([place])   .  Prolapsed cervical intervertebral disc 03/11/2018  . Cervical radiculopathy 02/18/2018  . 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 Syncope 09/04/2017  . Constipation 09/04/2017  . Nonintractable headache 09/04/2017  . Leg swelling 07/26/2017  . 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 Hyperlipidemia 04/20/2017  . Pedal edema 04/19/2017  . Bipolar I disorder ([place]) 09/19/2016  . Facial pain 07/12/2016  . Rash 06/14/2016  . Migraine 01/25/2016  . Idiopathic urticaria/pruritus 01/23/2016  . Allergic rhinitis 01/23/2016  . Oral candidiasis 01/23/2016  . Greater trochanteric bursitis of  both hips 09/08/2015  . Chest pain 06/22/2015  . Itching 06/22/2015  . Bilateral knee pain 06/22/2015  . Bilateral hip pain 06/22/2015  . Anemia, iron deficiency 12/22/2014  . Fatigue 12/21/2014  . Intertrigo 08/07/2014  . Dyspareunia 07/12/2014  . Menopausal syndrome (hot flushes) 07/12/2014  . History of tobacco abuse 07/12/2014  . Dizziness 06/22/2014  . Weakness 06/22/2014  . Fibromyalgia 05/14/2014  . Encounter for well adult exam with abnormal findings 04/22/2014  . Recurrent boils 04/22/2014  . Recurrent falls 04/22/2014  . Peripheral vascular disease ([place])   . Depression   . Anxiety   . GERD (gastroesophageal reflux disease)   . Prediabetes 02/23/2014  . Screening mammogram for high-risk patient 02/23/2014  . Back pain 07/22/2013  . COPD/asthma 08/24/2009  . Headache(784.0) 08/24/2009    Past Surgical History:  Procedure Laterality Date  . ABDOMINAL HYSTERECTOMY    . ANTERIOR CERVICAL DECOMP/DISCECTOMY FUSION N/A 07/28/2020   Procedure: ANTERIOR CERVICAL DECOMPRESSION/DISCECTOMY FUSION. INTERBODY PROTHESIS, PLATE/SCREWS CERVICAL THREE- CERVICAL FOUR, CERVICAL FOUR- CERVICAL FIVE;  Surgeon: Newman Pies, MD;  Location: [place];  Service: Neurosurgery;  Laterality: N/A;  . BACK SURGERY    . COLONOSCOPY  12/20/2011   Procedure: COLONOSCOPY;  Surgeon: Landry Dyke, MD;  Location: [facility];  Service: Endoscopy;  Laterality: N/A;  . COLONOSCOPY  03/05/2012   Procedure: COLONOSCOPY;  Surgeon: Landry Dyke, MD;  Location: [facility];  Service: Endoscopy;  Laterality: N/A;  . DIAGNOSTIC LAPAROSCOPY    . HEMORRHOID SURGERY    . INCISE AND DRAIN ABCESS    . KIDNEY STONE SURGERY    . NECK SURGERY     x 2 Dr Orinda Kenner  . SPINE SURGERY  2019  . TOE SURGERY    . TUBAL LIGATION       OB History    Gravida  7   Para  3   Term  3   Preterm      AB  4   Living  3     SAB  3   IAB  1   Ectopic      Multiple      Live Births               Family History  Problem Relation Age of Onset  . Diabetes Mother   . COPD Mother   . Heart disease Mother   . Asthma Mother   . Diabetes Father   . Kidney disease Father   . Cancer Father   . Anesthesia problems Father   . Alcohol abuse Father   . Diabetes Sister   . Hypertension Sister   . Diabetes Brother   . Sleep apnea Brother   . Asthma Brother   . Alcohol abuse Brother   . Heart disease Sister   . Diabetes Sister   . Brain cancer Sister   . Heart disease Brother   . Asthma Sister   . Allergic rhinitis  Neg Hx   . Eczema Neg Hx   . Urticaria Neg Hx     Social History   Tobacco Use  . Smoking status: Former Smoker    Packs/day: 0.25    Years: 36.00    Pack years: 9.00    Types: Cigarettes    Quit date: 01/21/2019    Years since quitting: 2.2  . Smokeless tobacco: Never Used  . Tobacco comment: Passive smoker.  Patient states her quit date was 05/09/2018.  Patient educated with resources at today's appointment to continue to support her to stop smoking.  Vaping Use  . Vaping Use: Never used  Substance Use Topics  . Alcohol use: Not Currently  . Drug use: Yes    Types: Marijuana    Comment: occasionally    Home Medications Prior to Admission medications   Medication Sig Start Date End Date Taking? Authorizing Provider  albuterol (PROVENTIL) (2.5 MG/3ML) 0.083% nebulizer solution Take 3 mLs (2.5 mg total) by nebulization every 6 (six) hours as needed for wheezing or shortness of breath. Reported on 06/04/2016 08/11/20   Lauraine Rinne, NP  albuterol (VENTOLIN HFA) 108 (90 Base) MCG/ACT inhaler INHALE 4 PUFFS INTO THE LUNGS EVERY 4 HOURS AS NEEDED FOR WHEEZING OR SHORTNESS OF BREATH 04/04/21   Ambs, Kathrine Cords, FNP  aspirin EC 81 MG tablet Take 81 mg by mouth daily. Swallow whole.    [provider]  atorvastatin (LIPITOR) 40 MG tablet Take 1 tablet (40 mg total) by mouth daily. 12/07/20   Biagio Borg, MD  Biotin 1000 MCG tablet Take 1,000 mcg by mouth  daily.    [provider]  Budeson-Glycopyrrol-Formoterol (BREZTRI AEROSPHERE) 160-9-4.8 MCG/ACT AERO INHALE 2 PUFFS INTO THE LUNGS TWICE DAILY WITH SPACER 02/02/21   Ambs, Kathrine Cords, FNP  carbamazepine (TEGRETOL) 200 MG tablet Take 2 tablets (400 mg total) by mouth 2 (two) times daily. 02/01/21   Plovsky, Berneta Sages, MD  cetirizine (ZYRTEC) 10 MG tablet Can take one tablet by mouth once daily if needed for runny nose. 04/28/21   Dara Hoyer, FNP  Cholecalciferol (VITAMIN D) 125 MCG (5000 UT) CAPS Take 5,000 Units by mouth daily.    [provider]  cyclobenzaprine (FLEXERIL) 5 MG tablet TAKE 1 TABLET(5 MG) BY MOUTH THREE TIMES DAILY AS NEEDED FOR MUSCLE SPASMS 04/28/21   Biagio Borg, MD  dextromethorphan-guaiFENesin [facility] DM) 30-600 MG 12hr tablet Take 1 tablet by mouth 2 (two) times daily.    [provider]  diclofenac Sodium (VOLTAREN) 1 % GEL Apply 4 g topically 4 (four) times daily. Patient taking differently: Apply 4 g topically 4 (four) times daily as needed (pain). 06/04/20   Deno Etienne, DO  docusate sodium (COLACE) 100 MG capsule Take 1 capsule (100 mg total) by mouth 2 (two) times daily. 07/29/20   Newman Pies, MD  doxepin (SINEQUAN) 25 MG capsule TAKE 1 CAPSULE(25 MG) BY MOUTH AT BEDTIME AS NEEDED FOR ITCHING 04/18/21   Valentina Shaggy, MD  DUPIXENT 300 MG/2ML prefilled syringe INJECT 300MG  (1 SYRINGE) SUBCUTANEOUSLY EVERY 14 DAYS 03/05/21   Valentina Shaggy, MD  estradiol (ESTRACE) 0.5 MG tablet TAKE 1 TABLET BY MOUTH DAILY 07/26/20   Princess Bruins, MD  famotidine (PEPCID) 20 MG tablet TAKE 1 TABLET(20 MG) BY MOUTH TWICE DAILY 01/20/21   Valentina Shaggy, MD  fluticasone [facility]) 50 MCG/ACT nasal spray SHAKE LIQUID AND USE 2 SPRAYS IN [facility] NOSTRIL DAILY 10/27/20   Martyn Ehrich, NP  hydrochlorothiazide (MICROZIDE) 12.5 MG capsule TAKE 1 CAPSULE(12.5 MG) BY MOUTH DAILY 11/18/20   Biagio Borg, MD  hydrOXYzine (ATARAX/VISTARIL) 50 MG tablet  Take 1 tablet (50 mg total) by mouth at bedtime. 02/03/21   Plovsky, Berneta Sages, MD  hydrOXYzine (VISTARIL) 25 MG capsule  02/24/21   [provider]  ipratropium-albuterol (DUONEB) 0.5-2.5 (3) MG/3ML SOLN Take 3 mLs by nebulization every 6 (six) hours as needed. 08/21/20   Tasia Catchings, Amy V, PA-C  irbesartan (AVAPRO) 75 MG tablet Take 1 tablet (75 mg total) by mouth daily. 12/07/20   Biagio Borg, MD  lactulose, encephalopathy, (GENERLAC) 10 GM/15ML SOLN TAKE 45 TO 90 ML( 30-60 GM) BY MOUTH TWICE DAILY AS NEEDED Patient taking differently: Take 30-60 g by mouth 2 (two) times daily as needed (constipation). TAKE 45 TO 90 ML( 30-60 GM) BY MOUTH TWICE DAILY AS NEEDED 03/20/19   Biagio Borg, MD  montelukast (SINGULAIR) 10 MG tablet TAKE 1 TABLET(10 MG) BY MOUTH AT BEDTIME 02/14/21   Byrum, Rose Fillers, MD  nystatin (MYCOSTATIN) 100000 UNIT/ML suspension SHAKE WELL AND GIVE 5 ML BY MOUTH EVERY 6 HOURS FOR 7 DAYS 03/10/21   Ambs, Kathrine Cords, FNP  Olopatadine HCl (PATADAY) 0.2 % SOLN Place 1 drop into both eyes 1 day or 1 dose. 04/28/21   Dara Hoyer, FNP  oxyCODONE-acetaminophen (PERCOCET/ROXICET) 5-325 MG tablet Take 1-2 tablets by mouth every 4 (four) hours as needed for moderate pain. 07/29/20   Newman Pies, MD  pantoprazole (PROTONIX) 40 MG tablet TAKE 1 TABLET BY MOUTH EVERY DAY 10/24/20   Biagio Borg, MD  potassium chloride (KLOR-CON) 10 MEQ tablet Take 1 tablet (10 mEq total) by mouth daily. 04/13/21   Biagio Borg, MD  triamcinolone (KENALOG) 0.1 % APPLY TOPICALLY TO THE AFFECTED AREA TWICE DAILY 12/07/20   Biagio Borg, MD  UNKNOWN TO PATIENT Weekly allergy shots    [provider]  vitamin C (ASCORBIC ACID) 500 MG tablet Take 500 mg by mouth daily.     [provider]    Allergies    Codeine, Sulfa antibiotics, Sulfonamide derivatives, and Morphine  Review of Systems   Review of Systems  Constitutional: Positive for chills. Negative for fever.  HENT: Negative for ear pain and sore  throat.   Eyes: Negative for pain and visual disturbance.  Respiratory: Positive for cough. Negative for shortness of breath.   Cardiovascular: Negative for chest pain and palpitations.  Gastrointestinal: Positive for abdominal pain, nausea and vomiting.  Genitourinary: Negative for dysuria and hematuria.  Musculoskeletal: Negative for arthralgias and back pain.  Skin: Negative for color change and rash.  Neurological: Positive for headaches. Negative for seizures and syncope.  All other systems reviewed and are negative.   Physical Exam Updated Vital Signs BP (!) 113/91   Pulse 84   Temp 98.4 F (36.9 C) (Oral)   Resp 16   Ht 5\' 3"  (1.6 m)   Wt 67.5 kg   SpO2 95%   BMI 26.36 kg/m   Physical Exam Vitals and nursing note reviewed.  Constitutional:      General: She is not in acute distress.    Appearance: She is well-developed.  HENT:     Head: Normocephalic and atraumatic.  Eyes:     Extraocular Movements: Extraocular movements intact.     Conjunctiva/sclera: Conjunctivae normal.  Cardiovascular:     Rate and Rhythm: Normal rate and regular rhythm.     Heart sounds: No murmur heard.  Pulmonary:     Effort: Pulmonary effort is normal. No respiratory distress.     Breath sounds: Normal breath sounds.  Abdominal:     Palpations: Abdomen is soft.     Tenderness: There is abdominal tenderness (generalized). There is guarding (voluntary).  Musculoskeletal:     Cervical back: Normal range of motion and neck supple.     Right lower leg: No edema.     Left lower leg: No edema.  Skin:    General: Skin is warm and dry.     Capillary Refill: Capillary refill takes less than 2 seconds.  Neurological:     General: No focal deficit present.     Mental Status: She is alert and oriented to person, place, and time.     Sensory: No sensory deficit.     Motor: No weakness.  Psychiatric:        Thought Content: Thought content normal.        Judgment: Judgment normal.      ED Results / Procedures / Treatments   Labs (all labs ordered are listed, but only abnormal results are displayed) Labs Reviewed  COMPREHENSIVE METABOLIC PANEL - Abnormal; Notable for the following components:      Result Value   Sodium 131 (*)    Glucose, Bld 140 (*)    Calcium 8.6 (*)    Total Protein 6.2 (*)    All other components within normal limits  CBC - Abnormal; Notable for the following components:   WBC 15.6 (*)    RBC 5.56 (*)    MCV 78.2 (*)    MCH 23.4 (*)    MCHC 29.9 (*)    RDW 25.2 (*)    Platelets 545 (*)    All other components within normal limits  RESP PANEL BY RT-PCR (FLU A&B, COVID) ARPGX2  LIPASE, BLOOD  URINALYSIS, ROUTINE W REFLEX MICROSCOPIC  I-STAT BETA HCG BLOOD, ED ([facility], [facility], [facility] ONLY)    EKG None  Radiology US Renal  Result Date: 05/05/2021 CLINICAL DATA:  Ureteral of phthisis evaluation. EXAM: RENAL / URINARY TRACT ULTRASOUND COMPLETE COMPARISON:  None. FINDINGS: Right Kidney: Renal measurements: 10.5 cm x 4.6 cm x 5.6 cm = volume: 142.1 mL. Echogenicity within normal limits. No mass or hydronephrosis visualized. Left Kidney: Renal measurements: 11.0 cm x 5.5 cm x 4.5 cm = volume: 143.0 mL. Echogenicity within normal limits. A 1.0 cm x 1.1 cm x 1.0 cm complex anechoic structure is seen within the lower pole of the left kidney. No abnormal flow is noted within this region on color Doppler evaluation. No hydronephrosis is visualized. Bladder: Appears normal for degree of bladder distention. Bilateral ureteral jets are visualized. Other: The study is limited secondary to overlying bowel gas. IMPRESSION: Small, complex left renal cyst. Correlation with nonemergent IV contrast enhanced abdomen and pelvis CT is recommended. Electronically Signed   By: Virgina Norfolk M.D.   On: 05/05/2021 21:32    Procedures Procedures   Medications Ordered in ED Medications  ketorolac (TORADOL) 15 MG/ML injection 15 mg (15 mg Intravenous Given 05/05/21 2010)   lactated ringers bolus 1,000 mL (0 mLs Intravenous Stopped 05/05/21 2249)  prochlorperazine (COMPAZINE) injection 5 mg (5 mg Intravenous Given 05/05/21 2307)  diphenhydrAMINE (BENADRYL) injection 50 mg (50 mg Intravenous Given 05/05/21 2308)    ED Course  I have reviewed the triage vital signs and the nursing notes.  Pertinent labs & imaging results that were available during my care of the patient were  reviewed by me and considered in my medical decision making (see chart for details).    MDM Rules/Calculators/A&P                          Patient presents with a headache and abdominal pain, in context of IV iron infusion several hours prior to presentation.  Do not think acute medication reaction is likely given the duration since symptoms.  Iron toxicity considered however the documented dose appears appropriate.  Given her dysuria, a sending UTI versus ureteric lithiasis is also considered.  We will obtain a US renal to evaluate for stone, urinalysis.  Otherwise, labs obtained in triage  Renal US without obstructive process or other emergency.  Labs overall without any acute emergent process; nonspecific leukocytosis.  On reassessment, abdp ain has resolved. Headache still there ebut improved to a 6/10. Repeat abdominal exam without tenderness. Given her repeat exam, overall findings, safe and appropriate for discharge. Discussed return precautions and outpatient f/u. After visit, called about [place] incidental finding, recommend f/u PCP for CT. She expressed understanding and agreed,  Final Clinical Impression(s) / ED Diagnoses Final diagnoses:  Abdominal pain, unspecified abdominal location    Rx / DC Orders ED Discharge Orders    None       Aris Lot, MD 05/06/21 1516    Mesner, [name], MD 05/06/21 1732

## 2021-05-09 ENCOUNTER — Ambulatory Visit (INDEPENDENT_AMBULATORY_CARE_PROVIDER_SITE_OTHER): Payer: Medicare HMO

## 2021-05-09 ENCOUNTER — Encounter: Payer: Self-pay | Admitting: Internal Medicine

## 2021-05-09 ENCOUNTER — Ambulatory Visit (INDEPENDENT_AMBULATORY_CARE_PROVIDER_SITE_OTHER): Payer: Medicare HMO | Admitting: Internal Medicine

## 2021-05-09 ENCOUNTER — Other Ambulatory Visit: Payer: Self-pay

## 2021-05-09 VITALS — BP 108/68 | HR 79 | Temp 98.3°F | Ht 63.0 in | Wt 149.0 lb

## 2021-05-09 DIAGNOSIS — R0600 Dyspnea, unspecified: Secondary | ICD-10-CM

## 2021-05-09 DIAGNOSIS — R059 Cough, unspecified: Secondary | ICD-10-CM

## 2021-05-09 DIAGNOSIS — J309 Allergic rhinitis, unspecified: Secondary | ICD-10-CM

## 2021-05-09 DIAGNOSIS — Z889 Allergy status to unspecified drugs, medicaments and biological substances status: Secondary | ICD-10-CM

## 2021-05-09 DIAGNOSIS — R0602 Shortness of breath: Secondary | ICD-10-CM | POA: Diagnosis not present

## 2021-05-09 MED ORDER — TRIAMCINOLONE ACETONIDE 0.1 % EX CREA: TOPICAL_CREAM | CUTANEOUS | 1 refills | Status: DC

## 2021-05-09 MED ORDER — METHYLPREDNISOLONE ACETATE 80 MG/ML IJ SUSP
80.0000 mg | Freq: Once | INTRAMUSCULAR | Status: AC
  Administered 2021-05-09: 80 mg via INTRAMUSCULAR

## 2021-05-09 MED ORDER — PREDNISONE 10 MG PO TABS: ORAL_TABLET | ORAL | 0 refills | Status: DC

## 2021-05-09 NOTE — Progress Notes (Signed)
Patient ID: [name], female   DOB: [birth year], 59 y.o.   MRN: [id number]        Chief Complaint: recent venofer reaction, allergy and cough       HPI:  [name] is a 60 y.o. female here after episode blurred vision, dizziness, HA, rash to both arms with hives like rash to chest after second dose venofer.  Does have several wks ongoing nasal allergy symptoms with clearish congestion, itch and sneezing, without fever, pain, ST, swelling or wheezingl but does also have non prod cough.   Pt denies polydipsia, polyuria, or new focal neuro s/s.   Pt denies fever, wt loss, night sweats, loss of appetite, or other constitutional symptoms        Wt Readings from Last 3 Encounters:  05/09/21 149 lb (67.6 kg)  05/05/21 148 lb 13 oz (67.5 kg)  05/05/21 148 lb 12.8 oz (67.5 kg)   BP Readings from Last 3 Encounters:  05/09/21 108/68  05/05/21 (!) 113/91  05/05/21 106/70         Past Medical History:  Diagnosis Date  . ALLERGIC RHINITIS 05/12/2009   Qualifier: Diagnosis of  By: Lenn Cal Deborra Medina), Wynona Canes    . Anemia, iron deficiency 12/22/2014   pt. denies  . Anginal pain ([place])   . Anxiety   . Arthritis   . Asthma   . ASTHMA 05/12/2009   Severe AFL (Spirometry 05/2009: pre-BD FEV1 0.87L 34% pred, post-BD FEV1 1.11L 44% pred) Volumes hyperinflated Decreased DLCO that does not fully correct to normal range for alveolar volume.     . Bipolar disorder ([place])   . COPD 08/24/2009   Qualifier: Diagnosis of  By: Burnett Kanaris    . COPD (chronic obstructive pulmonary disease) ([place])   . Depression   . Dyspnea    sometimes when exerting self  . Fibromyalgia 05/14/2014  . GERD (gastroesophageal reflux disease)   . Headache   . History of kidney stones   . Hyperlipidemia 04/20/2017  . HYPERTENSION 05/12/2009   Qualifier: Diagnosis of  By: Lenn Cal Deborra Medina), Wynona Canes    . Kidney stones   . Peripheral vascular disease ([place])   . Pneumonia   . Prediabetes 02/23/2014   pt. denies  . Seizure  ([place])   . Seizure ([place])   . Stroke ([place]) 11/2020  . Urticaria    Past Surgical History:  Procedure Laterality Date  . ABDOMINAL HYSTERECTOMY    . ANTERIOR CERVICAL DECOMP/DISCECTOMY FUSION N/A 07/28/2020   Procedure: ANTERIOR CERVICAL DECOMPRESSION/DISCECTOMY FUSION. INTERBODY PROTHESIS, PLATE/SCREWS CERVICAL THREE- CERVICAL FOUR, CERVICAL FOUR- CERVICAL FIVE;  Surgeon: Newman Pies, MD;  Location: [facility];  Service: Neurosurgery;  Laterality: N/A;  . BACK SURGERY    . COLONOSCOPY  12/20/2011   Procedure: COLONOSCOPY;  Surgeon: Landry Dyke, MD;  Location: [facility];  Service: Endoscopy;  Laterality: N/A;  . COLONOSCOPY  03/05/2012   Procedure: COLONOSCOPY;  Surgeon: Landry Dyke, MD;  Location: [facility];  Service: Endoscopy;  Laterality: N/A;  . DIAGNOSTIC LAPAROSCOPY    . HEMORRHOID SURGERY    . INCISE AND DRAIN ABCESS    . KIDNEY STONE SURGERY    . NECK SURGERY     x 2 Dr Orinda Kenner  . SPINE SURGERY  2019  . TOE SURGERY    . TUBAL LIGATION      reports that she quit smoking about 2 years ago. Her smoking use included cigarettes. She has a 9.00 pack-year smoking history. She has  never used smokeless tobacco. She reports previous alcohol use. She reports current drug use. Drug: Marijuana. family history includes Alcohol abuse in her brother and father; Anesthesia problems in her father; Asthma in her brother, mother, and sister; Brain cancer in her sister; COPD in her mother; Cancer in her father; Diabetes in her brother, father, mother, sister, and sister; Heart disease in her brother, mother, and sister; Hypertension in her sister; Kidney disease in her father; Sleep apnea in her brother. Allergies  Allergen Reactions  . Codeine Nausea And Vomiting  . Sulfa Antibiotics Nausea And Vomiting  . Sulfonamide Derivatives Nausea And Vomiting    REACTION: n/v  . Morphine Nausea And Vomiting  . Venofer [Iron Sucrose] Hives   Current Outpatient Medications on File Prior to  Visit  Medication Sig Dispense Refill  . albuterol (PROVENTIL) (2.5 MG/3ML) 0.083% nebulizer solution Take 3 mLs (2.5 mg total) by nebulization every 6 (six) hours as needed for wheezing or shortness of breath. Reported on 06/04/2016 120 mL 11  . albuterol (VENTOLIN HFA) 108 (90 Base) MCG/ACT inhaler INHALE 4 PUFFS INTO THE LUNGS EVERY 4 HOURS AS NEEDED FOR WHEEZING OR SHORTNESS OF BREATH 18 g 2  . aspirin EC 81 MG tablet Take 81 mg by mouth daily. Swallow whole.    Marland Kitchen atorvastatin (LIPITOR) 40 MG tablet Take 1 tablet (40 mg total) by mouth daily. 90 tablet 3  . Biotin 1000 MCG tablet Take 1,000 mcg by mouth daily.    . Budeson-Glycopyrrol-Formoterol (BREZTRI AEROSPHERE) 160-9-4.8 MCG/ACT AERO INHALE 2 PUFFS INTO THE LUNGS TWICE DAILY WITH SPACER 10.7 g 4  . carbamazepine (TEGRETOL) 200 MG tablet Take 2 tablets (400 mg total) by mouth 2 (two) times daily. 120 tablet 12  . cetirizine (ZYRTEC) 10 MG tablet Can take one tablet by mouth once daily if needed for runny nose. 30 tablet 5  . Cholecalciferol (VITAMIN D) 125 MCG (5000 UT) CAPS Take 5,000 Units by mouth daily.    . cyclobenzaprine (FLEXERIL) 5 MG tablet TAKE 1 TABLET(5 MG) BY MOUTH THREE TIMES DAILY AS NEEDED FOR MUSCLE SPASMS 60 tablet 2  . dextromethorphan-guaiFENesin (MUCINEX DM) 30-600 MG 12hr tablet Take 1 tablet by mouth 2 (two) times daily.    . diclofenac Sodium (VOLTAREN) 1 % GEL Apply 4 g topically 4 (four) times daily. (Patient taking differently: Apply 4 g topically 4 (four) times daily as needed (pain).) 100 g 0  . docusate sodium (COLACE) 100 MG capsule Take 1 capsule (100 mg total) by mouth 2 (two) times daily. 60 capsule 0  . doxepin (SINEQUAN) 25 MG capsule TAKE 1 CAPSULE(25 MG) BY MOUTH AT BEDTIME AS NEEDED FOR ITCHING 30 capsule 2  . estradiol (ESTRACE) 0.5 MG tablet TAKE 1 TABLET BY MOUTH DAILY 90 tablet 4  . famotidine (PEPCID) 20 MG tablet TAKE 1 TABLET(20 MG) BY MOUTH TWICE DAILY 64 tablet 5  . fluticasone (FLONASE) 50  MCG/ACT nasal spray SHAKE LIQUID AND USE 2 SPRAYS IN EACH NOSTRIL DAILY 16 g 5  . hydrochlorothiazide (MICROZIDE) 12.5 MG capsule TAKE 1 CAPSULE(12.5 MG) BY MOUTH DAILY 90 capsule 3  . hydrOXYzine (ATARAX/VISTARIL) 50 MG tablet Take 1 tablet (50 mg total) by mouth at bedtime. 90 tablet 1  . hydrOXYzine (VISTARIL) 25 MG capsule     . ipratropium-albuterol (DUONEB) 0.5-2.5 (3) MG/3ML SOLN Take 3 mLs by nebulization every 6 (six) hours as needed. 60 mL 0  . irbesartan (AVAPRO) 75 MG tablet Take 1 tablet (75 mg total) by mouth  daily. 90 tablet 3  . lactulose, encephalopathy, (GENERLAC) 10 GM/15ML SOLN TAKE 45 TO 90 ML( 30-60 GM) BY MOUTH TWICE DAILY AS NEEDED (Patient taking differently: Take 30-60 g by mouth 2 (two) times daily as needed (constipation). TAKE 45 TO 90 ML( 30-60 GM) BY MOUTH TWICE DAILY AS NEEDED) 473 mL 0  . montelukast (SINGULAIR) 10 MG tablet TAKE 1 TABLET(10 MG) BY MOUTH AT BEDTIME 30 tablet 2  . nystatin (MYCOSTATIN) 100000 UNIT/ML suspension SHAKE WELL AND GIVE 5 ML BY MOUTH EVERY 6 HOURS FOR 7 DAYS 60 mL 0  . Olopatadine HCl (PATADAY) 0.2 % SOLN Place 1 drop into both eyes 1 day or 1 dose. 2.5 mL 5  . oxyCODONE-acetaminophen (PERCOCET/ROXICET) 5-325 MG tablet Take 1-2 tablets by mouth every 4 (four) hours as needed for moderate pain. 30 tablet 0  . pantoprazole (PROTONIX) 40 MG tablet TAKE 1 TABLET BY MOUTH EVERY DAY 90 tablet 3  . potassium chloride (KLOR-CON) 10 MEQ tablet Take 1 tablet (10 mEq total) by mouth daily. 90 tablet 3  . pravastatin (PRAVACHOL) 40 MG tablet Take 40 mg by mouth daily as needed.    . TEZSPIRE 210 MG/1.91ML SOSY Inject into the skin.    Marland Kitchen UNKNOWN TO PATIENT Weekly allergy shots    . vitamin C (ASCORBIC ACID) 500 MG tablet Take 500 mg by mouth daily.      No current facility-administered medications on file prior to visit.        ROS:  All others reviewed and negative.  Objective        PE:  BP 108/68 (BP Location: Right Arm, Patient Position:  Sitting, Cuff Size: Normal)   Pulse 79   Temp 98.3 F (36.8 C) (Oral)   Ht 5\' 3"  (1.6 m)   Wt 149 lb (67.6 kg)   SpO2 98%   BMI 26.39 kg/m                 Constitutional: Pt appears in NAD               HENT: Head: NCAT.                Right Ear: External ear normal.                 Left Ear: External ear normal.                Eyes: . Pupils are equal, round, and reactive to light. Conjunctivae and EOM are normal; Bilat tm's with mild erythema.  Max sinus areas non tender.  Pharynx with mild erythema, no exudate               Nose: without d/c or deformity               Neck: Neck supple. Gross normal ROM               Cardiovascular: Normal rate and regular rhythm.                 Pulmonary/Chest: Effort normal and breath sounds without rales or wheezing.                Abd:  Soft, NT, ND, + BS, no organomegaly               Neurological: Pt is alert. At baseline orientation, motor grossly intact               Skin: Skin is warm.  No rashes, no other new lesions, LE edema - none               Psychiatric: Pt behavior is normal without agitation   Micro: none  Cardiac tracings I have personally interpreted today:  none  Pertinent Radiological findings (summarize): none   Lab Results  Component Value Date   WBC 15.6 (H) 05/05/2021   HGB 13.0 05/05/2021   HCT 43.5 05/05/2021   PLT 545 (H) 05/05/2021   GLUCOSE 140 (H) 05/05/2021   CHOL 142 04/11/2021   TRIG 47.0 04/11/2021   HDL 64.30 04/11/2021   LDLCALC 69 04/11/2021   ALT 19 05/05/2021   AST 30 05/05/2021   NA 131 (L) 05/05/2021   K 4.7 05/05/2021   CL 99 05/05/2021   CREATININE 0.93 05/05/2021   BUN 18 05/05/2021   CO2 22 05/05/2021   TSH 0.47 04/11/2021   HGBA1C 6.2 04/11/2021   MICROALBUR 0.9 04/11/2021   Assessment/Plan:  [name] is a 60 y.o. Black or African American [2] female with  has a past medical history of ALLERGIC RHINITIS (05/12/2009), Anemia, iron deficiency (12/22/2014), Anginal pain  ([place]), Anxiety, Arthritis, Asthma, ASTHMA (05/12/2009), Bipolar disorder ([place]), COPD (08/24/2009), COPD (chronic obstructive pulmonary disease) ([place]), Depression, Dyspnea, Fibromyalgia (05/14/2014), GERD (gastroesophageal reflux disease), Headache, History of kidney stones, Hyperlipidemia (04/20/2017), HYPERTENSION (05/12/2009), Kidney stones, Peripheral vascular disease ([place]), Pneumonia, Prediabetes (02/23/2014), Seizure ([place]), Seizure ([place]), Stroke ([place]) (11/2020), and Urticaria.  Drug allergy Rash resolved, but no further venofer  Allergic rhinitis Mild to mod, for depomedrol im 80, predpac asd,,  to f/u any worsening symptoms or concerns  Cough Exam benign, but for cxr,  to f/u any worsening symptoms or concerns  Followup: Return in about 2 months (around 07/09/2021).  Cathlean Cower, MD 05/14/2021 10:47 PM [facility]

## 2021-05-09 NOTE — Patient Instructions (Addendum)
You had the steroid shot today  Please take all new medication as prescribed - the prednisone  Please continue all other medications as before, including the cream  Please have the pharmacy call with any other refills you may need.  Please continue your efforts at being more active, low cholesterol diet, and weight control.  Please keep your appointments with your specialists as you may have planned - GI for the anemia  Please go to the XRAY Department in the first floor for the x-ray testing  You will be contacted by phone if any changes need to be made immediately.  Otherwise, you will receive a letter about your results with an explanation, but please check with MyChart first.  Please remember to sign up for MyChart if you have not done so, as this will be important to you in the future with finding out test results, communicating by private email, and scheduling acute appointments online when needed.  Please make an Appointment to return in 2 months

## 2021-05-10 ENCOUNTER — Institutional Professional Consult (permissible substitution): Payer: Medicare HMO | Admitting: Diagnostic Neuroimaging

## 2021-05-10 ENCOUNTER — Encounter: Payer: Self-pay | Admitting: Internal Medicine

## 2021-05-12 ENCOUNTER — Ambulatory Visit (INDEPENDENT_AMBULATORY_CARE_PROVIDER_SITE_OTHER): Payer: Medicare HMO

## 2021-05-12 DIAGNOSIS — J309 Allergic rhinitis, unspecified: Secondary | ICD-10-CM | POA: Diagnosis not present

## 2021-05-14 ENCOUNTER — Encounter: Payer: Self-pay | Admitting: Internal Medicine

## 2021-05-14 DIAGNOSIS — R059 Cough, unspecified: Secondary | ICD-10-CM | POA: Insufficient documentation

## 2021-05-14 DIAGNOSIS — R051 Acute cough: Secondary | ICD-10-CM | POA: Insufficient documentation

## 2021-05-14 DIAGNOSIS — Z889 Allergy status to unspecified drugs, medicaments and biological substances status: Secondary | ICD-10-CM | POA: Insufficient documentation

## 2021-05-14 NOTE — Assessment & Plan Note (Signed)
Rash resolved, but no further venofer

## 2021-05-14 NOTE — Assessment & Plan Note (Signed)
Mild to mod, for depomedrol im 80, predpac asd,,  to f/u any worsening symptoms or concerns

## 2021-05-14 NOTE — Assessment & Plan Note (Signed)
Exam benign, but for cxr,  to f/u any worsening symptoms or concerns

## 2021-05-16 ENCOUNTER — Other Ambulatory Visit: Payer: Self-pay

## 2021-05-16 ENCOUNTER — Ambulatory Visit (INDEPENDENT_AMBULATORY_CARE_PROVIDER_SITE_OTHER): Payer: Medicare HMO | Admitting: Allergy & Immunology

## 2021-05-16 ENCOUNTER — Ambulatory Visit: Payer: Self-pay

## 2021-05-16 ENCOUNTER — Encounter: Payer: Self-pay | Admitting: Allergy & Immunology

## 2021-05-16 VITALS — BP 110/62 | HR 71 | Temp 98.2°F | Resp 18 | Ht 63.0 in | Wt 148.6 lb

## 2021-05-16 DIAGNOSIS — J3089 Other allergic rhinitis: Secondary | ICD-10-CM

## 2021-05-16 DIAGNOSIS — D649 Anemia, unspecified: Secondary | ICD-10-CM | POA: Diagnosis not present

## 2021-05-16 DIAGNOSIS — K219 Gastro-esophageal reflux disease without esophagitis: Secondary | ICD-10-CM

## 2021-05-16 DIAGNOSIS — J4540 Moderate persistent asthma, uncomplicated: Secondary | ICD-10-CM | POA: Diagnosis not present

## 2021-05-16 MED ORDER — OLOPATADINE HCL 0.2 % OP SOLN: 1.0000 [drp] | OPHTHALMIC | 3 refills | Status: DC

## 2021-05-16 MED ORDER — TEZEPELUMAB-EKKO 210 MG/1.91ML ~~LOC~~ SOSY
210.0000 mg | PREFILLED_SYRINGE | Freq: Once | SUBCUTANEOUS | Status: AC
  Administered 2021-05-16: 210 mg via SUBCUTANEOUS

## 2021-05-16 MED ORDER — BREZTRI AEROSPHERE 160-9-4.8 MCG/ACT IN AERO: INHALATION_SPRAY | RESPIRATORY_TRACT | 1 refills | Status: DC

## 2021-05-16 NOTE — Patient Instructions (Addendum)
1. Asthma-COPD overlap syndrome - Lung testing looks stable. - Try to stop the smoking of the marijuana, but try edibles instead since this won't affect your breathing/lungs.  - Daily controller medication(s): Breztri 161mcg two puffs twice daily with spacer + Tezspire monthly  - Prior to physical activity: Proventil (ORANAGE INHALER) 2 puffs or Ventolin (GRAY INHALER) 2 puffs 10-15 minutes before physical activity. - Rescue medications: Proventil (ORANAGE INHALER) 2 puffs or Ventolin (GRAY INHALER) 2 puffs AND 2 puffs Atrovent (GREEN INHALER) every 4-6 hours as needed - Asthma control goals:  * Full participation in all desired activities (may need albuterol before activity) * Albuterol use two time or less a week on average (not counting use with activity) * Cough interfering with sleep two time or less a month * Oral steroids no more than once a year * No hospitalizations   2. Chronic urticaria - Continue with Xyzal twice daily. - Continue with famotidine twice daily. - Continue with Doxepin 25mg  at night to help with itching.   3. 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 - Continue with allergy shots.   4. Return in about 4 months (around 09/15/2021).    Please inform us of any Emergency Department visits, hospitalizations, or changes in symptoms. Call us before going to the ED for breathing or allergy symptoms since we might be able to fit you in for a sick visit. Feel free to contact us anytime with any questions, problems, or concerns.  It was a pleasure to see you again today!  Websites that have reliable patient information: 1. American Academy of Asthma, Allergy, and Immunology: www.aaaai.org 2. Food Allergy Research and Education (FARE): foodallergy.org 3. Mothers of Asthmatics: http://www.asthmacommunitynetwork.org 4. American College of Allergy, Asthma, and Immunology: www.acaai.org   COVID-19  Vaccine Information can be found at: ShippingScam.co.uk For questions related to vaccine distribution or appointments, please email [email or web address] or call [phone].     "Like" [place] on Facebook and Instagram for our latest updates!       Make sure you are registered to vote! If you have moved or changed any of your contact information, you will need to get this updated before voting!  In some cases, you MAY be able to register to vote online: [email or web address]

## 2021-05-16 NOTE — Progress Notes (Signed)
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 chest CT next visit)  Perennial allergic rhinitis(dust mites)- on allergen immunotherapy  Chronic urticaria  Recurrent infections- with largely normal workup aside from non-protective Pneumococcal titers and non-protective Diptheria titers  Recent stroke - with no correlation with Dupixent (confirmed with the [facility])   Plan/Recommendations:   1. Asthma-COPD overlap syndrome - Lung testing looks stable. - Try to stop the smoking Felicia Felicia, but try edibles instead since this won't affect your breathing/lungs.  - Daily controller medication(s): Breztri 126mcg two puffs twice daily with spacer + Tezspire monthly  - Prior to physical activity: Proventil (ORANAGE INHALER) 2 puffs or Ventolin (GRAY INHALER) 2 puffs 10-15 minutes before physical activity. - Rescue medications: Proventil (ORANAGE INHALER) 2 puffs or Ventolin (GRAY INHALER) 2 puffs AND 2 puffs Atrovent (GREEN INHALER) every 4-6 hours as needed - Asthma control goals:  * Full participation in all desired activities (may need albuterol before activity) * Albuterol use two time or less a week on average (not counting use with activity) * Cough interfering with sleep two time or less a month * Oral steroids no more than once a year * No hospitalizations   2. Chronic urticaria - Continue with Xyzal twice daily. - Continue with famotidine twice daily. - Continue with Doxepin 25mg  at night to help with itching.   3. 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 - Continue with allergy shots.   4. Return in about 4 months (around 09/15/2021).    Subjective:   Felicia Felicia is a 60 y.o. female presenting today for follow up Felicia  Chief Complaint  Patient presents with  . Asthma     Allergic rhinitis     Felicia Felicia has a history Felicia the following: Patient Active Problem List   Diagnosis Date Noted  . Drug allergy 05/14/2021  . Cough 05/14/2021  . Bilateral leg weakness 04/17/2021  . Status post cervical spinal fusion 12/27/2020  . History Felicia stroke 12/11/2020  . Medication management 10/21/2020  . Body mass index (BMI) 28.0-28.9, adult 08/23/2020  . Healthcare maintenance 08/11/2020  . Cervical spondylosis with myelopathy and radiculopathy 07/28/2020  . Preop exam for internal medicine 06/19/2020  . Hypotension due to medication 06/11/2020  . Status asthmaticus 06/09/2020  . Essential (primary) hypertension 05/17/2020  . Other spondylosis with myelopathy, cervical region 02/16/2020  . Cervical spondylosis 02/02/2020  . Radiculopathy, cervical region 02/02/2020  . Spondylolysis, cervical region 02/02/2020  . Neck pain 02/02/2020  . Leg cramping 08/12/2019  . Laryngopharyngeal reflux (LPR) 05/26/2019  . Sinus pressure 05/26/2019  . Hypophosphatemia 01/07/2019  . Vertigo 09/23/2018  . Dysfunction Felicia left eustachian tube 09/23/2018  . Gait disorder 04/21/2018  . Leukocytosis 04/15/2018  . Nausea & vomiting 04/15/2018  . Seizure ([place])   . Prolapsed cervical intervertebral disc 03/11/2018  . Cervical radiculopathy 02/18/2018  . 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 Syncope 09/04/2017  . Constipation 09/04/2017  . Nonintractable headache 09/04/2017  . Leg swelling 07/26/2017  . Task-specific dystonia Felicia hand 07/23/2017  . Hyperlipidemia 04/20/2017  . Pedal edema 04/19/2017  . Bipolar I disorder ([place]) 09/19/2016  . Facial pain 07/12/2016  . Rash 06/14/2016  . Migraine 01/25/2016  . Idiopathic urticaria/pruritus 01/23/2016  . Allergic rhinitis 01/23/2016  . Oral candidiasis 01/23/2016  . Greater trochanteric bursitis  Felicia both hips 09/08/2015  . Chest pain 06/22/2015  . Itching 06/22/2015  . Bilateral knee pain 06/22/2015  . Bilateral hip pain  06/22/2015  . Anemia, iron deficiency 12/22/2014  . Fatigue 12/21/2014  . Intertrigo 08/07/2014  . Dyspareunia 07/12/2014  . Menopausal syndrome (hot flushes) 07/12/2014  . History Felicia tobacco abuse 07/12/2014  . Dizziness 06/22/2014  . Weakness 06/22/2014  . Fibromyalgia 05/14/2014  . Encounter for well adult exam with abnormal findings 04/22/2014  . Recurrent boils 04/22/2014  . Recurrent falls 04/22/2014  . Peripheral vascular disease ([place])   . Depression   . Anxiety   . GERD (gastroesophageal reflux disease)   . Prediabetes 02/23/2014  . Screening mammogram for high-risk patient 02/23/2014  . Felicia pain 07/22/2013  . COPD/asthma 08/24/2009  . Headache(784.0) 08/24/2009    History obtained from: chart review and patient.  Felicia Felicia is a 60 y.o. female presenting for a follow up visit. She was last seen by our clinic in May 2022 around 2 weeks ago.  At that visit, one Felicia our nurse practitioners providing her with prednisone.  She was changed from [place] to [facility].  She was continued on Breztri twice daily as well as DuoNebs as needed.  For her allergic rhinitis, she was started on Zyrtec 10 mg daily and continued on Flonase.  Allergen immunotherapy was continued.  She was continued on pantoprazole and famotidine for her reflux.  Since last visit, she has done fairly well.  She got steroids in early May 2022 by our NP Webb Silversmith. She also got a steroid shot this past Friday for a rash from her PCP. She estimates that she steroids every couple Felicia months or so.    Asthma/Respiratory Symptom History: She remains on Breztri 2 puffs twice daily.  She is compliant with this medication.  She is ready to start the new biologic today.  She has been using her rescue inhaler 1-2 times a day, but this seems to be baseline.  She is smoking Felicia on the weekends.  She has not tried using edibles.  She knows that she needs to stop, but apparently her 8 year old neighbor encourages her to  smoke pot with her.  Allergic Rhinitis Symptom History: She remains on her allergy shots Felicia dust mite. She is on 0.1 mL Felicia her Red Vial.  She reports that her allergy symptoms have been very well controlled since starting the allergen immunotherapy.  She has a Zyrtec, but does not use it every day.  She is not a huge fan Felicia the Flonase.  Eczema/Urticaria Symptom History: She is still on her Xyzal twice daily as well as famotidine twice daily. She did develop a hyperpigmented lesion on her underarm that was relieved with a cream.   She is going to see GI for evaluation Felicia blood loss thought to be related to her anemia.  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  Otherwise, there have been no changes to her past medical history, surgical history, family history, or social history.    Review Felicia Systems  Constitutional: Negative.  Negative for chills, fever, malaise/fatigue and weight loss.  HENT: Positive for congestion. Negative for ear discharge, ear pain and sinus pain.   Eyes: Negative for pain, discharge and redness.  Respiratory: Positive for cough and shortness Felicia breath. Negative for sputum production and wheezing.   Cardiovascular: Negative.  Negative for chest pain and palpitations.  Gastrointestinal: Negative for abdominal pain,  constipation, diarrhea, heartburn, nausea and vomiting.  Skin: Negative.  Negative for itching and rash.  Neurological: Negative for dizziness and headaches.  Endo/Heme/Allergies: Positive for environmental allergies. Does not bruise/bleed easily.       Objective:   Blood pressure 110/62, pulse 71, temperature 98.2 F (36.8 C), temperature source Temporal, resp. rate 18, height 5\' 3"  (1.6 m), weight 148 lb 9.6 oz (67.4 kg), SpO2 96 %. Body mass index is 26.32 kg/m.   Physical Exam:  Physical Exam Vitals reviewed.  Constitutional:      Appearance: She is well-developed.  HENT:     Head: Normocephalic and  atraumatic.     Right Ear: Tympanic membrane, ear canal and external ear normal.     Left Ear: Tympanic membrane, ear canal and external ear normal.     Nose: No nasal deformity, septal deviation, mucosal edema or rhinorrhea.     Right Turbinates: Enlarged and swollen.     Left Turbinates: Enlarged and swollen.     Right Sinus: No maxillary sinus tenderness or frontal sinus tenderness.     Left Sinus: No maxillary sinus tenderness or frontal sinus tenderness.     Mouth/Throat:     Mouth: Mucous membranes are not pale and not dry.     Pharynx: Uvula midline.  Eyes:     General:        Right eye: No discharge.        Left eye: No discharge.     Conjunctiva/sclera: Conjunctivae normal.     Right eye: Right conjunctiva is not injected. No chemosis.    Left eye: Left conjunctiva is not injected. No chemosis.    Pupils: Pupils are equal, round, and reactive to light.  Cardiovascular:     Rate and Rhythm: Normal rate and regular rhythm.     Heart sounds: Normal heart sounds.  Pulmonary:     Effort: Pulmonary effort is normal. No tachypnea, accessory muscle usage or respiratory distress.     Breath sounds: Normal breath sounds. No wheezing, rhonchi or rales.     Comments: Decreased air movement at the bases. Chest:     Chest wall: No tenderness.  Lymphadenopathy:     Cervical: No cervical adenopathy.  Skin:    General: Skin is warm.     Capillary Refill: Capillary refill takes less than 2 seconds.     Coloration: Skin is not pale.     Findings: No abrasion, erythema, petechiae or rash. Rash is not papular, urticarial or vesicular.     Comments: No eczematous or urticarial lesions noted.  Neurological:     Mental Status: She is alert.      Diagnostic studies:   Spirometry: results abnormal (FEV1: 0.80/38%, FVC: 1.47/55%, FEV1/FVC: 54%).    Spirometry consistent with mixed obstructive and restrictive disease.   Allergy Studies: none      Salvatore Marvel, MD  Allergy and  [place]icia [place]

## 2021-05-16 NOTE — Progress Notes (Signed)
Immunotherapy   Patient Details  Name: [name] MRN: [id number] Date of Birth: [birth year]  05/16/2021  [name] started injections for  Tezspire  Frequency: Every 4 Weeks  Epi-Pen:Epi-Pen Available  Consent signed and patient instructions given. Patient started Tezspire today and received 1.40mL in the [facility]. Patient waited 30 minutes in office and did not experience any issues.    [name] 05/16/2021, 11:04 AM

## 2021-05-17 ENCOUNTER — Telehealth: Payer: Self-pay | Admitting: Allergy & Immunology

## 2021-05-17 NOTE — Telephone Encounter (Signed)
Patient called stating she forgot to ask dr. Ernst Bowler about her cough at yesterday's visit. Patient states cough has been going on for about 3 weeks and she has tried robitussin to alleviate cough however that has not worked for her. Patient asked if Dr. Ernst Bowler could send something in to pharmacy for her cough.   Please advise  [place]

## 2021-05-18 MED ORDER — OLOPATADINE HCL 0.2 % OP SOLN: 1.0000 [drp] | OPHTHALMIC | 3 refills | Status: DC

## 2021-05-18 NOTE — Telephone Encounter (Signed)
Called and informed patient of the Ladona Ridgel and patient stated she also needed [place] eye drops sent in as well. I refilled tham and sent them to the [facility] on [address]

## 2021-05-18 NOTE — Telephone Encounter (Signed)
We just put her on prednisone.  I can send in [facility].  Unfortunately, I cannot do anything that is a controlled substance because my fingerprint thing does not work.  Salvatore Marvel, MD Allergy and [place] [facility] [place]

## 2021-05-19 MED ORDER — GUAIFENESIN-CODEINE 100-10 MG/5ML PO SOLN: 10.0000 mL | Freq: Three times a day (TID) | ORAL | 0 refills | Status: DC | PRN

## 2021-05-19 MED ORDER — ONDANSETRON 4 MG PO TBDP: 4.0000 mg | ORAL_TABLET | Freq: Three times a day (TID) | ORAL | 0 refills | Status: DC | PRN

## 2021-05-19 NOTE — Telephone Encounter (Signed)
Prescription has been faxed to the pharmacy, left a detailed message on patients voicemail informing her of Dr. Gillermina Hu recommendation.

## 2021-05-19 NOTE — Telephone Encounter (Signed)
Dr. Gallagher please advise.  

## 2021-05-19 NOTE — Addendum Note (Signed)
Addended by: Valentina Shaggy on: 05/19/2021 04:33 PM   Modules accepted: Orders

## 2021-05-19 NOTE — Telephone Encounter (Signed)
We can send in guaifenesin-codeine syrup 5-10 mL every 8 hours as needed. Dispense one bottle without no refills.   She has vomiting to opioids, so I am sending in Zofran as well.   Salvatore Marvel, MD Allergy and [place] [facility] [place]

## 2021-05-19 NOTE — Telephone Encounter (Signed)
Patient states [facility] on [address] did not receive a prescription for cough medication and she needs it before the weekend.   Please advise.

## 2021-05-20 ENCOUNTER — Other Ambulatory Visit (HOSPITAL_COMMUNITY): Payer: Self-pay | Admitting: Psychiatry

## 2021-05-22 ENCOUNTER — Other Ambulatory Visit: Payer: Self-pay | Admitting: *Deleted

## 2021-05-24 ENCOUNTER — Encounter: Payer: Self-pay | Admitting: Nurse Practitioner

## 2021-05-24 ENCOUNTER — Ambulatory Visit (INDEPENDENT_AMBULATORY_CARE_PROVIDER_SITE_OTHER): Payer: Medicare HMO

## 2021-05-24 ENCOUNTER — Ambulatory Visit (INDEPENDENT_AMBULATORY_CARE_PROVIDER_SITE_OTHER): Payer: Medicare HMO | Admitting: Nurse Practitioner

## 2021-05-24 ENCOUNTER — Other Ambulatory Visit (INDEPENDENT_AMBULATORY_CARE_PROVIDER_SITE_OTHER): Payer: Medicare HMO

## 2021-05-24 VITALS — BP 110/70 | HR 76 | Ht 62.0 in | Wt 149.1 lb

## 2021-05-24 DIAGNOSIS — K219 Gastro-esophageal reflux disease without esophagitis: Secondary | ICD-10-CM

## 2021-05-24 DIAGNOSIS — K5900 Constipation, unspecified: Secondary | ICD-10-CM

## 2021-05-24 DIAGNOSIS — J309 Allergic rhinitis, unspecified: Secondary | ICD-10-CM

## 2021-05-24 DIAGNOSIS — D509 Iron deficiency anemia, unspecified: Secondary | ICD-10-CM | POA: Diagnosis not present

## 2021-05-24 DIAGNOSIS — Z86010 Personal history of colonic polyps: Secondary | ICD-10-CM

## 2021-05-24 DIAGNOSIS — R1310 Dysphagia, unspecified: Secondary | ICD-10-CM | POA: Diagnosis not present

## 2021-05-24 DIAGNOSIS — Z800 Family history of malignant neoplasm of digestive organs: Secondary | ICD-10-CM

## 2021-05-24 LAB — CBC WITH DIFFERENTIAL/PLATELET
Basophils Absolute: 0.1 10*3/uL (ref 0.0–0.1)
Basophils Relative: 1.1 % (ref 0.0–3.0)
Eosinophils Absolute: 0 10*3/uL (ref 0.0–0.7)
Eosinophils Relative: 0.3 % (ref 0.0–5.0)
HCT: 35.6 % — ABNORMAL LOW (ref 36.0–46.0)
Hemoglobin: 11.4 g/dL — ABNORMAL LOW (ref 12.0–15.0)
Lymphocytes Relative: 19 % (ref 12.0–46.0)
Lymphs Abs: 2.4 10*3/uL (ref 0.7–4.0)
MCHC: 32.1 g/dL (ref 30.0–36.0)
MCV: 78.7 fl (ref 78.0–100.0)
Monocytes Absolute: 0.5 10*3/uL (ref 0.1–1.0)
Monocytes Relative: 4.1 % (ref 3.0–12.0)
Neutro Abs: 9.6 10*3/uL — ABNORMAL HIGH (ref 1.4–7.7)
Neutrophils Relative %: 75.5 % (ref 43.0–77.0)
Platelets: 318 10*3/uL (ref 150.0–400.0)
RBC: 4.52 Mil/uL (ref 3.87–5.11)
RDW: 30.4 % — ABNORMAL HIGH (ref 11.5–15.5)
WBC: 12.7 10*3/uL — ABNORMAL HIGH (ref 4.0–10.5)

## 2021-05-24 LAB — COMPREHENSIVE METABOLIC PANEL
ALT: 17 U/L (ref 0–35)
AST: 14 U/L (ref 0–37)
Albumin: 4.2 g/dL (ref 3.5–5.2)
Alkaline Phosphatase: 69 U/L (ref 39–117)
BUN: 8 mg/dL (ref 6–23)
CO2: 29 mEq/L (ref 19–32)
Calcium: 9.2 mg/dL (ref 8.4–10.5)
Chloride: 103 mEq/L (ref 96–112)
Creatinine, Ser: 0.62 mg/dL (ref 0.40–1.20)
GFR: 98.16 mL/min (ref >60.00)
Glucose, Bld: 88 mg/dL (ref 70–99)
Potassium: 4 mEq/L (ref 3.5–5.1)
Sodium: 139 mEq/L (ref 135–145)
Total Bilirubin: 0.3 mg/dL (ref 0.2–1.2)
Total Protein: 6.9 g/dL (ref 6.0–8.3)

## 2021-05-24 MED ORDER — PANTOPRAZOLE SODIUM 40 MG PO TBEC: DELAYED_RELEASE_TABLET | ORAL | 1 refills | Status: DC

## 2021-05-24 NOTE — Progress Notes (Signed)
05/24/2021 [name] [id number] 1961-06-20   CHIEF COMPLAINT: Anemia   HISTORY OF PRESENT ILLNESS:  [name] is a 60 year old female with a past medical history of anxiety, depression, bipolar disorder, fibromyalgia, hypertension, brain MRI 11/2020 showed evidence of an old infarct, seizures, asthma, COPD, kidney stones, IDA, H. Pylori gastritis per EGD 01/2015 and a hyperplastic polyp per colonoscopy in 2013.  She presents to our office today as referred by Dr. Cathlean [name] for further evaluation regarding iron deficiency anemia.   She was previously followed by [name] in 2013.  She wishes to transition her [name] management to [facility].   She was seen by Dr. Jenny [name] for wellness visit on 04/11/2021 and laboratory studies during that office visit showed iron deficiency anemia.  Hemoglobin 8.5 (Hg 10.2 on 12/02/2020).  Iron 18.  Ferritin 3.8.  Vitamin B12 330.  BUN 10.  Creatinine 0.71.  LFTs were normal.  She was prescribed Venofer IV X 2. She received the 2nd dose of Venofer on 05/05/2021 and a few hours later she developed a headache, generalized weakness and lower abdominal pain so she presented to the ED for further evaluation.   Labs in the ED showed a hemoglobin level of 13 ( Hg level 8.5 on 04/11/2021).  WBC 15.6.  Platelet 545.  Glucose 140.  Sodium 131.  BUN 18.  Creatinine 0.93.  LFTs were normal.  She also noted having dysuria. A urinalysis was negative.  A renal sonogram showed a complex left renal cyst.  Her abdominal pain abated after she received Toradol therefore a CTAP was not done and she was discharged home. She was seen by her PCP Dr. Cathlean [name] on 05/09/2021 for further follow up and she reported having a rash/hives to her arms and chest which was thought to be due to the Venofer infusion.    She stated her diagnosis of anemia was new.  However in review of her laboratory studies in epic, she has been anemic since at least 09/2018 with Hg level 10  -11s since then but with a recent drop in her Hg level  to 8.5 as noted above.  She has a history of GERD.  She took Pantoprazole for several years but she thinks she recently stopped taking this medication.  She is taking Famotidine 20 mg twice daily.  She complains of having worsening heartburn which occurs daily for the past 3 weeks.  Increased burping.  She describes having food which gets stuck to the mid esophagus which is painful and is relieved after she drinks Pepsi which occurs QOD for the past 3 weeks.  No specific food triggers but if she eats a larger meal she has worse heartburn, esophageal pain and dysphagia.  She takes aspirin 81 mg daily.  She takes BC powder for arthritis type pain 1 packet approximately 10 doses monthly.  No alcohol use.  She has chronic constipation.  She is passing a hard brown stool every 3 to 4 days.  No obvious rectal bleeding or black-colored stools.  She reports losing 11 pounds over the past 8 weeks.  No fever, sweats or chills.  She underwent an EGD 01/2015 by [name] [name] which identified H. pylori gastritis.  She not recall having H. pylori or if she completed treatment for this.  She underwent a colonoscopy 01/2012 which identified a hyperplastic polyp which was removed from the sigmoid colon.  Her father was diagnosed with colon cancer in his 28s and  later died at the age of 63.  Past total hysterectomy.  She reported having vaginal bleeding x 1 day in March 2022 without recurrence.  She did not discuss this issue with Dr. Jenny [name]. Past tubal ligation then total hysterectomy in the 1990's.    CBC Latest Ref Rng & Units 05/05/2021 04/11/2021 12/02/2020  WBC 4.0 - 10.5 K/uL 15.6(H) 9.3 10.3  Hemoglobin 12.0 - 15.0 g/dL 13.0 8.5 Repeated and verified X2.(L) 10.2(L)  Hematocrit 36.0 - 46.0 % 43.5 26.8(L) 33.2(L)  Platelets 150 - 400 K/uL 545(H) 389.0 376    CMP Latest Ref Rng & Units 05/05/2021 04/11/2021 12/02/2020  Glucose 70 - 99 mg/dL 140(H) 73 81  BUN 6 -  20 mg/dL 18 10 9   Creatinine 0.44 - 1.00 mg/dL 0.93 0.71 0.66  Sodium 135 - 145 mmol/L 131(L) 146(H) 140  Potassium 3.5 - 5.1 mmol/L 4.7 2.9(L) 3.9  Chloride 98 - 111 mmol/L 99 99 101  CO2 22 - 32 mmol/L 22 37(H) 29  Calcium 8.9 - 10.3 mg/dL 8.6(L) 9.2 8.9  Total Protein 6.5 - 8.1 g/dL 6.2(L) 6.8 -  Total Bilirubin 0.3 - 1.2 mg/dL 0.8 0.2 -  Alkaline Phos 38 - 126 U/L 66 72 -  AST 15 - 41 U/L 30 20 -  ALT 0 - 44 U/L 19 13 -   Renal sonogram 05/05/2021: Small, complex left renal cyst. Correlation with nonemergent IV contrast enhanced abdomen and pelvis CT is recommended.  Brain MRI 12/02/2020: No acute finding by MRI. 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 This is not recent, as was suggested may be the case on the CT from earlier today. Mild chronic small-vessel ischemic changes elsewhere affecting the cerebral hemispheric white matter and basal ganglia.  Colonoscopy by [name] in 12/20/2011: External hemorrhoids Poor bowel prep  Colonoscopy by [name] 03/05/2012: External hemorrhoid Small polyp removed from the sigmoid colon Recall colonoscopy 5 years Colon, polyp(s), , Sigmoid HYPERPLASTIC POLYP(S) WITH SURFACE EROSION. NO ADENOMATOUS CHANGE OR MALIGNANCY IDENTIFIED.  EGD 01/24/2015: The examined esophagus was normal.  No esophageal stricture, ring, mass or features of eosinophilic esophagitis were identified Patchy moderate inflammation characterized by congestion edema, erythema and granularity was found in the entire examined stomach.  Biopsies were taken with a cold forceps for histology. The exam of the stomach was otherwise normal. Patchy moderate inflammation was found in the duodenal bulb. Exam of the duodenum was otherwise normal. Surgical [P], gastric, biopsy - CHRONIC ACTIVE GASTRITIS WITH HELICOBACTER PYLORI ORGANISMS. - THERE IS NO EVIDENCE OF DYSPLASIA OR MALIGNANCY   Past Medical History:  Diagnosis  Date  . ALLERGIC RHINITIS 05/12/2009   Qualifier: Diagnosis of  By: [name]), [name]    . Anemia, iron deficiency 12/22/2014   pt. denies  . Anginal pain ([place])   . Anxiety   . Arthritis   . Asthma   . ASTHMA 05/12/2009   Severe AFL (Spirometry 05/2009: pre-BD FEV1 0.87L 34% pred, post-BD FEV1 1.11L 44% pred) Volumes hyperinflated Decreased DLCO that does not fully correct to normal range for alveolar volume.     . Bipolar disorder ([place])   . COPD 08/24/2009   Qualifier: Diagnosis of  By: Burnett Kanaris    . COPD (chronic obstructive pulmonary disease) ([place])   . Depression   . Dyspnea    sometimes when exerting self  . Fibromyalgia 05/14/2014  . GERD (gastroesophageal reflux disease)   . Headache   . History of kidney  stones   . Hyperlipidemia 04/20/2017  . HYPERTENSION 05/12/2009   Qualifier: Diagnosis of  By: [name]), [name]    . Kidney stones   . Peripheral vascular disease ([place])   . Pneumonia   . Prediabetes 02/23/2014   pt. denies  . Seizure ([place])   . Seizure ([place])   . Stroke ([place]) 11/2020  . Urticaria    Past Surgical History:  Procedure Laterality Date  . ABDOMINAL HYSTERECTOMY    . ANTERIOR CERVICAL DECOMP/DISCECTOMY FUSION N/A 07/28/2020   Procedure: ANTERIOR CERVICAL DECOMPRESSION/DISCECTOMY FUSION. INTERBODY PROTHESIS, PLATE/SCREWS CERVICAL THREE- CERVICAL FOUR, CERVICAL FOUR- CERVICAL FIVE;  Surgeon: Newman Pies, MD;  Location: [facility];  Service: Neurosurgery;  Laterality: N/A;  . BACK SURGERY    . COLONOSCOPY  12/20/2011   Procedure: COLONOSCOPY;  Surgeon: Landry Dyke, MD;  Location: [facility];  Service: Endoscopy;  Laterality: N/A;  . COLONOSCOPY  03/05/2012   Procedure: COLONOSCOPY;  Surgeon: Landry Dyke, MD;  Location: [facility];  Service: Endoscopy;  Laterality: N/A;  . DIAGNOSTIC LAPAROSCOPY    . HEMORRHOID SURGERY    . INCISE AND DRAIN ABCESS    . KIDNEY STONE SURGERY    . NECK SURGERY     x 2 Dr Orinda Kenner  . SPINE SURGERY   2019  . TOE SURGERY    . TUBAL LIGATION      Echo 06/10/2020: 1. Left ventricular ejection fraction, by estimation, is 60 to 65%. The left ventricle has normal function. The left ventricle has no regional wall motion abnormalities. Left ventricular diastolic parameters were normal. 2. Right ventricular systolic function is normal. The right ventricular size is normal. Tricuspid regurgitation signal is inadequate for assessing PA pressure. 3. The mitral valve is normal in structure. Trivial mitral valve regurgitation. No evidence of mitral stenosis. 4. Aortic valve appears grossly normal. Unable to determine valve morphology due to image quality. Aortic valve regurgitation is not visualized. No aortic stenosis is present. 5. The inferior vena cava is normal in size with greater than 50% respiratory variability, suggesting right atrial pressure of 3 mmHg.  Social History:  She is divorced. She smoked cigarettes x 20 years, quit smoking in 2001. No alcohol. She smokes marijuana once monthly, no other drug use.   Family History: Father with history of colon cancer diagnosis in 60's died age 35.  Father and brother with history of alcohol abuse.  Father with problems with anesthesia.  Mother with heart disease, COPD and diabetes.  Sister with brain cancer.     Allergies  Allergen Reactions  . Codeine Nausea And Vomiting  . Sulfa Antibiotics Nausea And Vomiting  . Sulfonamide Derivatives Nausea And Vomiting    REACTION: n/v  . Morphine Nausea And Vomiting  . Venofer [Iron Sucrose] Hives     Outpatient Encounter Medications as of 05/24/2021  Medication Sig  . albuterol (PROVENTIL) (2.5 MG/3ML) 0.083% nebulizer solution Take 3 mLs (2.5 mg total) by nebulization every 6 (six) hours as needed for wheezing or shortness of breath. Reported on 06/04/2016  . albuterol (VENTOLIN HFA) 108 (90 Base) MCG/ACT inhaler INHALE 4 PUFFS INTO THE LUNGS EVERY 4 HOURS AS NEEDED FOR WHEEZING OR SHORTNESS OF  BREATH  . aspirin EC 81 MG tablet Take 81 mg by mouth daily. Swallow whole.  Marland Kitchen atorvastatin (LIPITOR) 40 MG tablet Take 1 tablet (40 mg total) by mouth daily.  . Biotin 1000 MCG tablet Take 1,000 mcg by mouth daily.  . Budeson-Glycopyrrol-Formoterol (BREZTRI AEROSPHERE) 160-9-4.8 MCG/ACT  AERO INHALE 2 PUFFS INTO THE LUNGS TWICE DAILY WITH SPACER  . carbamazepine (TEGRETOL) 200 MG tablet Take 2 tablets (400 mg total) by mouth 2 (two) times daily.  . cetirizine (ZYRTEC) 10 MG tablet Can take one tablet by mouth once daily if needed for runny nose.  . Cholecalciferol (VITAMIN D) 125 MCG (5000 UT) CAPS Take 5,000 Units by mouth daily.  . cyclobenzaprine (FLEXERIL) 5 MG tablet TAKE 1 TABLET(5 MG) BY MOUTH THREE TIMES DAILY AS NEEDED FOR MUSCLE SPASMS  . dextromethorphan-guaiFENesin (MUCINEX DM) 30-600 MG 12hr tablet Take 1 tablet by mouth 2 (two) times daily.  . diclofenac Sodium (VOLTAREN) 1 % GEL Apply 4 g topically 4 (four) times daily. (Patient taking differently: Apply 4 g topically 4 (four) times daily as needed (pain).)  . docusate sodium (COLACE) 100 MG capsule Take 1 capsule (100 mg total) by mouth 2 (two) times daily.  Marland Kitchen doxepin (SINEQUAN) 25 MG capsule TAKE 1 CAPSULE(25 MG) BY MOUTH AT BEDTIME AS NEEDED FOR ITCHING  . estradiol (ESTRACE) 0.5 MG tablet TAKE 1 TABLET BY MOUTH DAILY  . famotidine (PEPCID) 20 MG tablet TAKE 1 TABLET(20 MG) BY MOUTH TWICE DAILY  . fluticasone (FLONASE) 50 MCG/ACT nasal spray SHAKE LIQUID AND USE 2 SPRAYS IN EACH NOSTRIL DAILY  . guaiFENesin-codeine 100-10 MG/5ML syrup Take 10 mLs by mouth 3 (three) times daily as needed for cough.  . hydrochlorothiazide (MICROZIDE) 12.5 MG capsule TAKE 1 CAPSULE(12.5 MG) BY MOUTH DAILY  . hydrOXYzine (ATARAX/VISTARIL) 50 MG tablet Take 1 tablet (50 mg total) by mouth at bedtime.  . hydrOXYzine (VISTARIL) 25 MG capsule   . ipratropium-albuterol (DUONEB) 0.5-2.5 (3) MG/3ML SOLN Take 3 mLs by nebulization every 6 (six) hours as  needed.  . irbesartan (AVAPRO) 75 MG tablet Take 1 tablet (75 mg total) by mouth daily.  Marland Kitchen lactulose, encephalopathy, (GENERLAC) 10 GM/15ML SOLN TAKE 45 TO 90 ML( 30-60 GM) BY MOUTH TWICE DAILY AS NEEDED (Patient taking differently: Take 30-60 g by mouth 2 (two) times daily as needed (constipation). TAKE 45 TO 90 ML( 30-60 GM) BY MOUTH TWICE DAILY AS NEEDED)  . montelukast (SINGULAIR) 10 MG tablet TAKE 1 TABLET(10 MG) BY MOUTH AT BEDTIME  . nystatin (MYCOSTATIN) 100000 UNIT/ML suspension SHAKE WELL AND GIVE 5 ML BY MOUTH EVERY 6 HOURS FOR 7 DAYS  . Olopatadine HCl (PATADAY) 0.2 % SOLN Place 1 drop into both eyes 1 day or 1 dose.  . ondansetron (ZOFRAN ODT) 4 MG disintegrating tablet Take 1 tablet (4 mg total) by mouth every 8 (eight) hours as needed for nausea or vomiting.  Marland Kitchen oxyCODONE-acetaminophen (PERCOCET/ROXICET) 5-325 MG tablet Take 1-2 tablets by mouth every 4 (four) hours as needed for moderate pain.  . pantoprazole (PROTONIX) 40 MG tablet TAKE 1 TABLET BY MOUTH EVERY DAY  . potassium chloride (KLOR-CON) 10 MEQ tablet Take 1 tablet (10 mEq total) by mouth daily.  . pravastatin (PRAVACHOL) 40 MG tablet Take 40 mg by mouth daily as needed.  . predniSONE (DELTASONE) 10 MG tablet 3 tabs by mouth per day for 3 days,2tabs per day for 3 days,1tab per day for 3 days  . TEZSPIRE 210 MG/1.91ML SOSY Inject into the skin.  Marland Kitchen triamcinolone cream (KENALOG) 0.1 % APPLY TOPICALLY TO THE AFFECTED AREA TWICE DAILY  . UNKNOWN TO PATIENT Weekly allergy shots  . vitamin C (ASCORBIC ACID) 500 MG tablet Take 500 mg by mouth daily.    No facility-administered encounter medications on file as of 05/24/2021.  REVIEW OF SYSTEMS: Gen: + Weight loss, night sweats.  CV: Denies chest pain, palpitations or edema. Resp: Denies cough, shortness of breath of hemoptysis.  [name]: See HPI. GU : + Excessive urination, urine leakage.  GYN: + vaginal bleeding x 1 day 02/2021.  MS: + Arthritis and back pain.  Derm: Denies rash,  itchiness, skin lesions or unhealing ulcers. Psych: + Depression.  Heme: Denies bruising, bleeding. Neuro:  Denies headaches, dizziness or paresthesias. Endo:  Excessive thirst.   PHYSICAL EXAM: BP 110/70 (BP Location: Left Arm, Patient Position: Sitting, Cuff Size: Normal)   Pulse 76   Ht 5\' 2"  (1.575 m) Comment: height measured without shoes  Wt 149 lb 2 oz (67.6 kg)   BMI 27.28 kg/m   Wt Readings from Last 3 Encounters:  05/24/21 149 lb 2 oz (67.6 kg)  05/16/21 148 lb 9.6 oz (67.4 kg)  05/09/21 149 lb (67.6 kg)   General: 60 year old female in no acute distress. Head: Normocephalic and atraumatic. Eyes:  Sclerae non-icteric, conjunctive pink. Colored contact lenses in use.  Ears: Normal auditory acuity. Mouth: Few missing teeth. No partial plates or bridges.  No ulcers or lesions.  Neck: Supple, no lymphadenopathy or thyromegaly.  Lungs: Clear bilaterally to auscultation without wheezes, crackles or rhonchi. Heart: Regular rate and rhythm. No murmur, rub or gallop appreciated.  Abdomen: Soft, nontender, non distended. No masses. No hepatosplenomegaly. Normoactive bowel sounds x 4 quadrants.  Rectal: Deferred.  Musculoskeletal: Symmetrical with no gross deformities. Skin: Warm and dry. No rash or lesions on visible extremities. Extremities: No edema. Neurological: Alert oriented x 4, no focal deficits.  Psychological:  Alert and cooperative. Normal mood and affect.  ASSESSMENT AND PLAN:  29. 60 year old female with acute on chronic IDA, etiology unknown. Hg 8.5 and iron level 8 on 04/11/2021. S/P Venofer infusion x 2. She had an adverse reaction following the 2nd dose of Venofer, refer to HPI. No overt [name] bleeding.  -CBC, iron, iron saturation, TIBC and Ferritin level, TTG, IGA level, CMP -EGD and colonoscopy benefits and risks discussed including risk with sedation, risk of bleeding, perforation and infection   2. History of GERD an H. Pylori gastritis 2016 per EGD by Felicia  [name]. Worsening heartburn and dysphagia x 3 weeks. -Continue Famotidine 20 mg twice daily -Restart Pantoprazole 40 mg daily  3. History of a hyperplastic colon polyp per colonoscopy in 2013. Father with history of colon cancer. -See plan in # 1  4. Abnormal vaginal bleeding x 1 day 02/2021 with reported total hysterectomy in the 1990's -Recommend gyn evaluation, defer to Dr. Jenny [name]  5. Weight loss 10 lbs past 8 weeks   6. Hyponatremia  -Repeat Na+ level included in lab order above   7. History of COPD, stable   8. Complex left renal cyst per renal sono 04/2021 -Follow up with Dr. Jenny [name]    CC:  Felicia Borg, MD

## 2021-05-24 NOTE — Patient Instructions (Signed)
If you are age 58 or younger, your body mass index should be between 19-25. Your Body mass index is 27.28 kg/m. If this is out of the aformentioned range listed, please consider follow up with your Primary Care Provider.   LABS:  Lab work has been ordered for you today. Our lab is located in the basement. Press "B" on the elevator. The lab is located at the first door on the left as you exit the elevator.  HEALTHCARE LAWS AND MY CHART RESULTS: Due to recent changes in healthcare laws, you may see the results of your imaging and laboratory studies on MyChart before your provider has had a chance to review them.   We understand that in some cases there may be results that are confusing or concerning to you. Not all laboratory results come back in the same time frame and the provider may be waiting for multiple results in order to interpret others.  Please give [place] 48 hours in order for your provider to thoroughly review all the results before contacting the office for clarification of your results.   PROCEDURES: You have been scheduled for an EGD and Colonoscopy. Please follow the written instructions given to you at your visit today. Please pick up your prep supplies at the pharmacy within the next 1-3 days. If you use inhalers (even only as needed), please bring them with you on the day of your procedure. Plenvu sample given today.  MEDICATION: We have sent the following medication to your pharmacy for you to pick up at your convenience: Pantoprazole 40 MG once a day.  RECOMMENDATIONS: Miralax- Dissolve one capful in 8 ounces of water and drink before bed. See GERD information:   Gastroesophageal Reflux Disease, Adult  Gastroesophageal reflux (GER) happens when acid from the stomach flows up into the tube that connects the mouth and the stomach (esophagus). Normally, food travels down the esophagus and stays in the stomach to be digested. With GER, food and stomach acid sometimes move back  up into the esophagus. You may have a disease called gastroesophageal reflux disease (GERD) if the reflux:  Happens often.  Causes frequent or very bad symptoms.  Causes problems such as damage to the esophagus. When this happens, the esophagus becomes sore and swollen. Over time, GERD can make small holes (ulcers) in the lining of the esophagus. What are the causes? This condition is caused by a problem with the muscle between the esophagus and the stomach. When this muscle is weak or not normal, it does not close properly to keep food and acid from coming back up from the stomach. The muscle can be weak because of:  Tobacco use.  Pregnancy.  Having a certain type of hernia (hiatal hernia).  Alcohol use.  Certain foods and drinks, such as coffee, chocolate, onions, and peppermint. What increases the risk?  Being overweight.  Having a disease that affects your connective tissue.  Taking NSAIDs, such a ibuprofen. What are the signs or symptoms?  Heartburn.  Difficult or painful swallowing.  The feeling of having a lump in the throat.  A bitter taste in the mouth.  Bad breath.  Having a lot of saliva.  Having an upset or bloated stomach.  Burping.  Chest pain. Different conditions can cause chest pain. Make sure you see your doctor if you have chest pain.  Shortness of breath or wheezing.  A long-term cough or a cough at night.  Wearing away of the surface of teeth (tooth enamel).  Weight loss. How is this treated?  Making changes to your diet.  Taking medicine.  Having surgery. Treatment will depend on how bad your symptoms are. Follow these instructions at home: Eating and drinking  Follow a diet as told by your doctor. You may need to avoid foods and drinks such as: ? Coffee and tea, with or without caffeine. ? Drinks that contain alcohol. ? Energy drinks and sports drinks. ? Bubbly (carbonated) drinks or sodas. ? Chocolate and  cocoa. ? Peppermint and mint flavorings. ? Garlic and onions. ? Horseradish. ? Spicy and acidic foods. These include peppers, chili powder, curry powder, vinegar, hot sauces, and BBQ sauce. ? Citrus fruit juices and citrus fruits, such as oranges, lemons, and limes. ? Tomato-based foods. These include red sauce, chili, salsa, and pizza with red sauce. ? Fried and fatty foods. These include donuts, french fries, potato chips, and high-fat dressings. ? High-fat meats. These include hot dogs, rib eye steak, sausage, ham, and bacon. ? High-fat dairy items, such as whole milk, butter, and cream cheese.  Eat small meals often. Avoid eating large meals.  Avoid drinking large amounts of liquid with your meals.  Avoid eating meals during the 2-3 hours before bedtime.  Avoid lying down right after you eat.  Do not exercise right after you eat.   Lifestyle  Do not smoke or use any products that contain nicotine or tobacco. If you need help quitting, ask your doctor.  Try to lower your stress. If you need help doing this, ask your doctor.  If you are overweight, lose an amount of weight that is healthy for you. Ask your doctor about a safe weight loss goal.   General instructions  Pay attention to any changes in your symptoms.  Take over-the-counter and prescription medicines only as told by your doctor.  Do not take aspirin, ibuprofen, or other NSAIDs unless your doctor says it is okay.  Wear loose clothes. Do not wear anything tight around your waist.  Raise (elevate) the head of your bed about 6 inches (15 cm). You may need to use a wedge to do this.  Avoid bending over if this makes your symptoms worse.  Keep all follow-up visits. Contact a doctor if:  You have new symptoms.  You lose weight and you do not know why.  You have trouble swallowing or it hurts to swallow.  You have wheezing or a cough that keeps happening.  You have a hoarse voice.  Your symptoms do not get  better with treatment. Get help right away if:  You have sudden pain in your arms, neck, jaw, teeth, or back.  You suddenly feel sweaty, dizzy, or light-headed.  You have chest pain or shortness of breath.  You vomit and the vomit is green, yellow, or black, or it looks like blood or coffee grounds.  You faint.  Your poop (stool) is red, bloody, or black.  You cannot swallow, drink, or eat. These symptoms may represent a serious problem that is an emergency. Do not wait to see if the symptoms will go away. Get medical help right away. Call your local emergency services (911 in the [place] Do not drive yourself to the hospital. Summary  If a person has gastroesophageal reflux disease (GERD), food and stomach acid move back up into the esophagus and cause symptoms or problems such as damage to the esophagus.  Treatment will depend on how bad your symptoms are.  Follow a diet as told by your  doctor.  Take all medicines only as told by your doctor. This information is not intended to replace advice given to you by your health care provider. Make sure you discuss any questions you have with your health care provider. Document Revised: 06/13/2020 Document Reviewed: 06/13/2020 Elsevier Patient Education  2021 [employer].    It was great seeing you today! Thank you for entrusting me with your care and choosing [facility]  Noralyn Pick, CRNP

## 2021-05-25 LAB — IGA: Immunoglobulin A: 97 mg/dL (ref 47–310)

## 2021-05-25 LAB — IRON,TIBC AND FERRITIN PANEL
%SAT: 42 % (calc) (ref 16–45)
Ferritin: 146 ng/mL (ref 16–232)
Iron: 113 ug/dL (ref 45–160)
TIBC: 269 mcg/dL (calc) (ref 250–450)

## 2021-05-25 LAB — TISSUE TRANSGLUTAMINASE ABS,IGG,IGA
(tTG) Ab, IgA: <1 U/mL
(tTG) Ab, IgG: <1 U/mL

## 2021-05-25 NOTE — Progress Notes (Signed)
____________________________________________________________  Attending physician addendum:  Thank you for sending this case to me. I have reviewed the entire note and agree with the plan.  This patient needs a referral to hematology if further IV iron is needed since she had an apparent reaction to venofer.  Her CBC needs to be rechecked in 2-3 weeks since I wonder if it was spuriously high on the ED check, considering how much and how fast it came up after IV iron.  Also, her UGI symptoms are worrisome.  If we get a cancellation and accommodate even just the EGD sooner, we will offer that to her.   Wilfrid Lund, MD  ____________________________________________________________

## 2021-05-26 ENCOUNTER — Telehealth: Payer: Self-pay

## 2021-05-26 NOTE — Telephone Encounter (Signed)
[name], this patient was seen by an APP in clinic on June 8 with multiple GI symptoms, most worrisome being dysphagia weight loss and anemia.  I was not supervising physician, but she was given the next available double slot with me for an EGD and colonoscopy on July 11.  Reviewing the chart, I am concerned about her and hope to get her procedures (or at least the EGD) done sooner if possible.

## 2021-05-26 NOTE — Telephone Encounter (Signed)
Spoke with patient in regards to re-scheduling her procedures to a sooner date. Patient has re-scheduled her EGD to Friday, 06/09/21 at 11:30 am. Patient is aware that she will need to arrive at 10:30 am with a care partner. Patient states that she has access to her My Chart, advised that I will send her instruction there for her review. Colonoscopy is still scheduled for 06/26/21. Patient verbalized understanding and had no concerns at the end of the call.   Staff message sent to [facility] letting them know that we are doing procedures separately in case she needs to make any adjustments for authorizations.

## 2021-06-02 ENCOUNTER — Other Ambulatory Visit: Payer: Self-pay

## 2021-06-02 ENCOUNTER — Ambulatory Visit (INDEPENDENT_AMBULATORY_CARE_PROVIDER_SITE_OTHER): Payer: Medicare HMO

## 2021-06-02 ENCOUNTER — Ambulatory Visit
Admission: RE | Admit: 2021-06-02 | Discharge: 2021-06-02 | Disposition: A | Discharge status: Home / Self Care | Payer: Medicare HMO | Source: Ambulatory Visit | Attending: Internal Medicine | Admitting: Internal Medicine

## 2021-06-02 DIAGNOSIS — J309 Allergic rhinitis, unspecified: Secondary | ICD-10-CM | POA: Diagnosis not present

## 2021-06-02 DIAGNOSIS — Z1231 Encounter for screening mammogram for malignant neoplasm of breast: Secondary | ICD-10-CM | POA: Diagnosis not present

## 2021-06-05 ENCOUNTER — Other Ambulatory Visit (INDEPENDENT_AMBULATORY_CARE_PROVIDER_SITE_OTHER): Payer: Medicare HMO

## 2021-06-05 ENCOUNTER — Encounter: Payer: Self-pay | Admitting: Internal Medicine

## 2021-06-05 ENCOUNTER — Telehealth: Payer: Self-pay

## 2021-06-05 DIAGNOSIS — D509 Iron deficiency anemia, unspecified: Secondary | ICD-10-CM

## 2021-06-05 LAB — CBC
HCT: 33.8 % — ABNORMAL LOW (ref 36.0–46.0)
Hemoglobin: 10.9 g/dL — ABNORMAL LOW (ref 12.0–15.0)
MCHC: 32.4 g/dL (ref 30.0–36.0)
MCV: 82.7 fl (ref 78.0–100.0)
Platelets: 364 10*3/uL (ref 150.0–400.0)
RBC: 4.08 Mil/uL (ref 3.87–5.11)
RDW: 31.8 % — ABNORMAL HIGH (ref 11.5–15.5)
WBC: 12.5 10*3/uL — ABNORMAL HIGH (ref 4.0–10.5)

## 2021-06-05 NOTE — Telephone Encounter (Signed)
Spoke with patient to remind her that she is due for labs at this time. No appointment is necessary. Patient is aware that she can stop by the lab in the basement at her convenience anytime before 5 PM today. Patient verbalized understanding and had no concerns at the end of the call.

## 2021-06-05 NOTE — Addendum Note (Signed)
Addended by: Cardell Peach I on: 06/05/2021 02:12 PM   Modules accepted: Orders

## 2021-06-05 NOTE — Telephone Encounter (Signed)
-----   Message from Yevette Edwards, RN sent at 05/30/2021 10:48 AM EDT ----- Regarding: Labs CBC today, prior to EGD on 6/24 per Dr. Loletha Carrow.

## 2021-06-05 NOTE — Telephone Encounter (Signed)
TC from lab, patient is is there waiting for lab draw, no orders were placed.  Chart reviewed, I have ordered the CBC.

## 2021-06-07 ENCOUNTER — Ambulatory Visit (INDEPENDENT_AMBULATORY_CARE_PROVIDER_SITE_OTHER): Payer: Medicare HMO

## 2021-06-07 DIAGNOSIS — J309 Allergic rhinitis, unspecified: Secondary | ICD-10-CM

## 2021-06-08 ENCOUNTER — Telehealth: Payer: Self-pay | Admitting: Gastroenterology

## 2021-06-08 ENCOUNTER — Encounter: Payer: Self-pay | Admitting: Certified Registered Nurse Anesthetist

## 2021-06-08 NOTE — Telephone Encounter (Signed)
Spoke with patient, she would like to proceed with EGD colon tomorrow, pt is aware that she will need to arrive at 10 am with a care partner. Patient states that she has access to her my chart, advised that I will send her updated instructions via my chart. Patient verbalized understanding of all information and had no concerns at the end of the call.

## 2021-06-08 NOTE — Telephone Encounter (Signed)
This patient is on my schedule for an upper endoscopy tomorrow.  She was originally scheduled for an EGD colonoscopy for anemia, dysphagia and weight loss, but we then moved up just the EGD due to urgent symptoms and schedule availability.  It appears that there is an opening in tomorrow morning schedule which would now accommodate doing both her EGD and colonoscopy.  I called her just now, and she is agreeable to doing that.  I asked her to remain on a clear liquid diet and said she would hear from staff this morning.  Please call her first thing this morning to confirm doing both procedures tomorrow, then added to the schedule and give her instructions on timing for her bowel preparation (originally scheduled for an afternoon procedure, now would be a morning procedure, so morning prep time needs to be adjusted).  Once that all happens, take her off the schedule for colonoscopy on July 11.  Thank you  - HD

## 2021-06-09 ENCOUNTER — Other Ambulatory Visit: Payer: Self-pay

## 2021-06-09 ENCOUNTER — Telehealth: Payer: Self-pay

## 2021-06-09 ENCOUNTER — Ambulatory Visit (AMBULATORY_SURGERY_CENTER): Payer: Medicare HMO | Admitting: Gastroenterology

## 2021-06-09 ENCOUNTER — Encounter: Payer: Self-pay | Admitting: Gastroenterology

## 2021-06-09 ENCOUNTER — Telehealth: Payer: Self-pay | Admitting: Allergy & Immunology

## 2021-06-09 VITALS — BP 141/95 | HR 88 | Temp 98.4°F | Resp 24 | Ht 62.0 in | Wt 149.0 lb

## 2021-06-09 DIAGNOSIS — K5730 Diverticulosis of large intestine without perforation or abscess without bleeding: Secondary | ICD-10-CM

## 2021-06-09 DIAGNOSIS — R1319 Other dysphagia: Secondary | ICD-10-CM | POA: Diagnosis not present

## 2021-06-09 DIAGNOSIS — R634 Abnormal weight loss: Secondary | ICD-10-CM

## 2021-06-09 DIAGNOSIS — K2970 Gastritis, unspecified, without bleeding: Secondary | ICD-10-CM

## 2021-06-09 DIAGNOSIS — J449 Chronic obstructive pulmonary disease, unspecified: Secondary | ICD-10-CM | POA: Diagnosis not present

## 2021-06-09 DIAGNOSIS — K648 Other hemorrhoids: Secondary | ICD-10-CM | POA: Diagnosis not present

## 2021-06-09 DIAGNOSIS — Z8619 Personal history of other infectious and parasitic diseases: Secondary | ICD-10-CM

## 2021-06-09 DIAGNOSIS — J45909 Unspecified asthma, uncomplicated: Secondary | ICD-10-CM | POA: Diagnosis not present

## 2021-06-09 DIAGNOSIS — K219 Gastro-esophageal reflux disease without esophagitis: Secondary | ICD-10-CM | POA: Diagnosis not present

## 2021-06-09 DIAGNOSIS — D509 Iron deficiency anemia, unspecified: Secondary | ICD-10-CM

## 2021-06-09 DIAGNOSIS — K2950 Unspecified chronic gastritis without bleeding: Secondary | ICD-10-CM | POA: Diagnosis not present

## 2021-06-09 DIAGNOSIS — K259 Gastric ulcer, unspecified as acute or chronic, without hemorrhage or perforation: Secondary | ICD-10-CM | POA: Diagnosis not present

## 2021-06-09 DIAGNOSIS — K6389 Other specified diseases of intestine: Secondary | ICD-10-CM

## 2021-06-09 MED ORDER — SODIUM CHLORIDE 0.9 % IV SOLN: 500.0000 mL | Freq: Once | INTRAVENOUS | Status: DC

## 2021-06-09 NOTE — Telephone Encounter (Signed)
-----   Message from [place], MD sent at 06/09/2021  4:09 PM EDT ----- This patient had an EGD and colonoscopy with me on 06/09/2021.  Please see reports and schedule a CT scan chest abdomen and pelvis with oral and IV contrast for weight loss  - HD

## 2021-06-09 NOTE — Op Note (Signed)
[facility] Patient Name: [name] Procedure Date: 06/09/2021 10:47 AM MRN: [id number] Endoscopist: [name] , MD Age: 60 Referring MD:  Date of Birth: Jan 03, 1961 Gender: Female Account #: [id number] Procedure:                Colonoscopy Indications:              Unexplained iron deficiency anemia, Weight loss Medicines:                Monitored Anesthesia Care Procedure:                Pre-Anesthesia Assessment:                           - Prior to the procedure, a History and Physical                            was performed, and patient medications and                            allergies were reviewed. The patient's tolerance of                            previous anesthesia was also reviewed. The risks                            and benefits of the procedure and the sedation                            options and risks were discussed with the patient.                            All questions were answered, and informed consent                            was obtained. Prior Anticoagulants: The patient has                            taken no previous anticoagulant or antiplatelet                            agents. ASA Grade Assessment: III - A patient with                            severe systemic disease. After reviewing the risks                            and benefits, the patient was deemed in                            satisfactory condition to undergo the procedure.                           After obtaining informed consent, the colonoscope  was passed under direct vision. Throughout the                            procedure, the patient's blood pressure, pulse, and                            oxygen saturations were monitored continuously. The                            Colonoscope was introduced through the anus and                            advanced to the the terminal ileum, with                             identification of the appendiceal orifice and IC                            valve. The colonoscopy was somewhat difficult due                            to a redundant colon. Successful completion of the                            procedure was aided by using manual pressure. The                            patient tolerated the procedure well. The quality                            of the bowel preparation was excellent. The                            terminal ileum, ileocecal valve, appendiceal                            orifice, and rectum were photographed. Scope In: 11:03:29 AM Scope Out: 11:16:51 AM Scope Withdrawal Time: 0 hours 9 minutes 12 seconds  Total Procedure Duration: 0 hours 13 minutes 22 seconds  Findings:                 The perianal and digital rectal examinations were                            normal.                           The terminal ileum appeared normal.                           There was a small lipoma, in the proximal ascending                            colon.  A single small-mouthed diverticulum was found in                            the proximal ascending colon.                           The sigmoid colon was redundant.                           Internal hemorrhoids were found. The hemorrhoids                            were small.                           The exam was otherwise without abnormality on                            direct and retroflexion views. Complications:            No immediate complications. Estimated Blood Loss:     Estimated blood loss: none. Impression:               - The examined portion of the ileum was normal.                           - Small lipoma in the proximal ascending colon.                           - Diverticulosis in the proximal ascending colon.                           - Redundant colon.                           - Internal hemorrhoids.                           - The examination was  otherwise normal on direct                            and retroflexion views.                           - No specimens collected. Recommendation:           - Patient has a contact number available for                            emergencies. The signs and symptoms of potential                            delayed complications were discussed with the                            patient. Return to normal activities tomorrow.  Written discharge instructions were provided to the                            patient.                           - Resume previous diet.                           - Continue present medications.                           - Repeat colonoscopy in 5 years for screening                            purposes - family history of colon cancer.                           - See the other procedure note for documentation of                            additional recommendations. Felicia Leise L. Loletha Carrow, MD 06/09/2021 11:34:52 AM This report has been signed electronically.

## 2021-06-09 NOTE — Telephone Encounter (Signed)
Patient is wanting to know if we have any Breztri samples she could pick up.

## 2021-06-09 NOTE — Progress Notes (Signed)
Pt wheezing and c/o "tightness", albuterol given and pt states feeling much better. MD udated, vss

## 2021-06-09 NOTE — Telephone Encounter (Signed)
Samples have been placed up front for the patient to pick up in the [place] office. Patient verbalized understanding.

## 2021-06-09 NOTE — Progress Notes (Signed)
1057 Robinul 0.2 mg IV given due large amount of secretions upon assessment.  MD made aware, vss

## 2021-06-09 NOTE — Progress Notes (Signed)
1111 Albuterol neb given due to wheezing, vss MD aware.

## 2021-06-09 NOTE — Op Note (Signed)
[facility] Patient Name: [name] Procedure Date: 06/09/2021 10:47 AM MRN: [id number] Endoscopist: Mallie Mussel L. Loletha Carrow , MD Age: 60 Referring MD:  Date of Birth: 11-06-1961 Gender: Female Account #: [id number] Procedure:                Upper GI endoscopy Indications:              Unexplained iron deficiency anemia, Esophageal                            dysphagia, Previously treated for Helicobacter                            pylori, Weight loss (approximately 15 pounds from                            March to late May)                           see additional clinical history in office consult                            note and today's EGD report.                           Smoking Hx Medicines:                Monitored Anesthesia Care Procedure:                Pre-Anesthesia Assessment:                           - Prior to the procedure, a History and Physical                            was performed, and patient medications and                            allergies were reviewed. The patient's tolerance of                            previous anesthesia was also reviewed. The risks                            and benefits of the procedure and the sedation                            options and risks were discussed with the patient.                            All questions were answered, and informed consent                            was obtained. Prior Anticoagulants: The patient has  taken no previous anticoagulant or antiplatelet                            agents except for aspirin. ASA Grade Assessment:                            III - A patient with severe systemic disease. After                            reviewing the risks and benefits, the patient was                            deemed in satisfactory condition to undergo the                            procedure.                           After obtaining informed consent, the  endoscope was                            passed under direct vision. Throughout the                            procedure, the patient's blood pressure, pulse, and                            oxygen saturations were monitored continuously. The                            Endoscope was introduced through the mouth, and                            advanced to the second part of duodenum. The upper                            GI endoscopy was accomplished without difficulty.                            The patient tolerated the procedure fairly well. Scope In: Scope Out: Findings:                 The larynx was normal.                           Patchy, white plaques were found in the middle                            third of the esophagus. Biopsies were taken with a                            cold forceps for histology. (Jar 4)                           The exam  of the esophagus was otherwise normal (no                            Barrett's, stricture, mass or esophagitis).                           Two non-bleeding linear and superficial gastric                            ulcers with no stigmata of bleeding were found in                            the prepyloric region of the stomach. The largest                            lesion was 8-10 mm in largest dimension. Biopsies                            were taken with a cold forceps for histology. (Jar2)                           Diffuse atrophic mucosa was found in the gastric                            antrum. Several biopsies were obtained on the                            greater curvature of the gastric body, on the                            lesser curvature of the gastric body, on the                            greater curvature of the gastric antrum and on the                            lesser curvature of the gastric antrum with cold                            forceps for histology. (Jar 3)                           The exam of the stomach  was otherwise normal.                           The cardia and gastric fundus were normal on                            retroflexion.                           Normal mucosa was found in the entire duodenum.  Biopsies for histology were taken with a cold                            forceps for evaluation of celiac disease. (Jar 1) Complications:            No immediate complications. Estimated Blood Loss:     Estimated blood loss was minimal. Impression:               - Normal larynx.                           - Esophageal plaques were found, suspicious for                            candidiasis. Biopsied.                           - Non-bleeding gastric ulcers with no stigmata of                            bleeding. Biopsied.                           - Gastric mucosal atrophy.                           - Normal mucosa was found in the entire examined                            duodenum. Biopsied.                           - Several biopsies were obtained on the greater                            curvature of the gastric body, on the lesser                            curvature of the gastric body, on the greater                            curvature of the gastric antrum and on the lesser                            curvature of the gastric antrum.                           No cause for weight loss seen on EGD or colonoscopy                            today. Not yet clear if that is related to IDA. Recommendation:           - Patient has a contact number available for                            emergencies. The  signs and symptoms of potential                            delayed complications were discussed with the                            patient. Return to normal activities tomorrow.                            Written discharge instructions were provided to the                            patient.                           - Resume previous diet.                            - Discontinue aspirin and NSAIDs for 4 weeks. Then                            resume enteric coated aspirin 81 mg once daily for                            Hx cerebrovascular disease.                           - Await pathology results.                           - Continue pantoprazole 40 mg once daily and add                            OTC omeprazole 20 mg with supper meal.                           Begin iron sulfate 325 mg twice daily. If causes                            constipation, begin miralax powder 1 capful in a                            glass of water daily, decreasing dose of stool too                            loose or frequent.                           See PCP for blood work in 4 weeks.                           - Perform a CT scan (computed tomography) of chest                            with contrast, abdomen with  contrast and pelvis                            with contrast at appointment to be scheduled. Kenshawn Maciolek L. Loletha Carrow, MD 06/09/2021 11:50:23 AM This report has been signed electronically.

## 2021-06-09 NOTE — Progress Notes (Signed)
Report given to PACU, vss 

## 2021-06-09 NOTE — Progress Notes (Signed)
Called to room to assist during endoscopic procedure.  Patient ID and intended procedure confirmed with present staff. Received instructions for my participation in the procedure from the performing physician.  

## 2021-06-09 NOTE — Patient Instructions (Addendum)
Please read handouts provided. Continue present medications. Repeat colonoscopy in 5 years for screening. Discontinue aspirin and NSAIDs for 4 weeks. Then resume enteric coated aspirin 81 mg once daily. Await pathology results. Continue pantoprazole 40 mg once daily and OTC omeprazole 20 mg with supper meal. Begin iron sulfate 325 mg twice daily, if constipated, begin miralax powder 1 capful in a glass of water daily. Please see PCP for blood work in 4 weeks, Dr. Loletha Carrow will communicate with PCP.    YOU HAD AN ENDOSCOPIC PROCEDURE TODAY AT [facility]:   Refer to the procedure report that was given to you for any specific questions about what was found during the examination.  If the procedure report does not answer your questions, please call your gastroenterologist to clarify.  If you requested that your care partner not be given the details of your procedure findings, then the procedure report has been included in a sealed envelope for you to review at your convenience later.  YOU SHOULD EXPECT: Some feelings of bloating in the abdomen. Passage of more gas than usual.  Walking can help get rid of the air that was put into your GI tract during the procedure and reduce the bloating. If you had a lower endoscopy (such as a colonoscopy or flexible sigmoidoscopy) you may notice spotting of blood in your stool or on the toilet paper. If you underwent a bowel prep for your procedure, you may not have a normal bowel movement for a few days.  Please Note:  You might notice some irritation and congestion in your nose or some drainage.  This is from the oxygen used during your procedure.  There is no need for concern and it should clear up in a day or so.  SYMPTOMS TO REPORT IMMEDIATELY:  Following lower endoscopy (colonoscopy or flexible sigmoidoscopy):  Excessive amounts of blood in the stool  Significant tenderness or worsening of abdominal pains  Swelling of the abdomen that is new,  acute  Fever of 100F or higher  Following upper endoscopy (EGD)  Vomiting of blood or coffee ground material  New chest pain or pain under the shoulder blades  Painful or persistently difficult swallowing  New shortness of breath  Fever of 100F or higher  Black, tarry-looking stools  For urgent or emergent issues, a gastroenterologist can be reached at any hour by calling [phone]. Do not use MyChart messaging for urgent concerns.    DIET:  We do recommend a small meal at first, but then you may proceed to your regular diet.  Drink plenty of fluids but you should avoid alcoholic beverages for 24 hours.  ACTIVITY:  You should plan to take it easy for the rest of today and you should NOT DRIVE or use heavy machinery until tomorrow (because of the sedation medicines used during the test).    FOLLOW UP: Our staff will call the number listed on your records 48-72 hours following your procedure to check on you and address any questions or concerns that you may have regarding the information given to you following your procedure. If we do not reach you, we will leave a message.  We will attempt to reach you two times.  During this call, we will ask if you have developed any symptoms of COVID 19. If you develop any symptoms (ie: fever, flu-like symptoms, shortness of breath, cough etc.) before then, please call [phone].  If you test positive for Covid 19 in the 2 weeks post  procedure, please call and report this information to [place].    If any biopsies were taken you will be contacted by phone or by letter within the next 1-3 weeks.  Please call us at [phone] if you have not heard about the biopsies in 3 weeks.    SIGNATURES/CONFIDENTIALITY: You and/or your care partner have signed paperwork which will be entered into your electronic medical record.  These signatures attest to the fact that that the information above on your After Visit Summary has been reviewed and is understood.   Full responsibility of the confidentiality of this discharge information lies with you and/or your care-partner.

## 2021-06-09 NOTE — Telephone Encounter (Signed)
CT order in epic. Staff message sent to radiology schedulers, April Pait and Rhys Martini to schedule.   Patient has been advised to expect a call from them, I have provided her with the radiology scheduling phone number in case she does not hear from them she can give them a call. Patient states that she received the 2 bottles of contrast today. Patient verbalized understanding and had no concerns at the end of the call.

## 2021-06-09 NOTE — Progress Notes (Signed)
Patient interviewed and examined in pre-procedure area with anesthesia staff and in procedure room. Felicia Lips, MD

## 2021-06-13 ENCOUNTER — Other Ambulatory Visit: Payer: Self-pay

## 2021-06-13 ENCOUNTER — Telehealth: Payer: Self-pay

## 2021-06-13 ENCOUNTER — Telehealth: Payer: Self-pay | Admitting: *Deleted

## 2021-06-13 ENCOUNTER — Ambulatory Visit (INDEPENDENT_AMBULATORY_CARE_PROVIDER_SITE_OTHER): Payer: Medicare HMO | Admitting: *Deleted

## 2021-06-13 DIAGNOSIS — J4540 Moderate persistent asthma, uncomplicated: Secondary | ICD-10-CM

## 2021-06-13 MED ORDER — TEZEPELUMAB-EKKO 210 MG/1.91ML ~~LOC~~ SOSY
210.0000 mg | PREFILLED_SYRINGE | SUBCUTANEOUS | Status: DC
  Administered 2021-06-13 – 2021-09-05 (×5): 210 mg via SUBCUTANEOUS

## 2021-06-13 NOTE — Telephone Encounter (Signed)
First attempt follow up call to pt, lm on vm 

## 2021-06-13 NOTE — Telephone Encounter (Signed)
Attempted 2nd f/u phone call. No answer. Left message.  °

## 2021-06-15 ENCOUNTER — Encounter: Payer: Self-pay | Admitting: Internal Medicine

## 2021-06-15 ENCOUNTER — Other Ambulatory Visit: Payer: Self-pay

## 2021-06-15 ENCOUNTER — Ambulatory Visit (INDEPENDENT_AMBULATORY_CARE_PROVIDER_SITE_OTHER): Payer: Medicare HMO | Admitting: Internal Medicine

## 2021-06-15 VITALS — BP 120/74 | HR 80 | Ht 62.0 in | Wt 148.0 lb

## 2021-06-15 DIAGNOSIS — T7840XA Allergy, unspecified, initial encounter: Secondary | ICD-10-CM | POA: Diagnosis not present

## 2021-06-15 DIAGNOSIS — D508 Other iron deficiency anemias: Secondary | ICD-10-CM

## 2021-06-15 DIAGNOSIS — J441 Chronic obstructive pulmonary disease with (acute) exacerbation: Secondary | ICD-10-CM | POA: Diagnosis not present

## 2021-06-15 DIAGNOSIS — Z23 Encounter for immunization: Secondary | ICD-10-CM | POA: Diagnosis not present

## 2021-06-15 DIAGNOSIS — J309 Allergic rhinitis, unspecified: Secondary | ICD-10-CM

## 2021-06-15 DIAGNOSIS — R7303 Prediabetes: Secondary | ICD-10-CM | POA: Diagnosis not present

## 2021-06-15 LAB — CBC WITH DIFFERENTIAL/PLATELET
Basophils Absolute: 0 10*3/uL (ref 0.0–0.1)
Basophils Relative: 0.3 % (ref 0.0–3.0)
Eosinophils Absolute: 0 10*3/uL (ref 0.0–0.7)
Eosinophils Relative: 0.5 % (ref 0.0–5.0)
HCT: 36.9 % (ref 36.0–46.0)
Hemoglobin: 12.1 g/dL (ref 12.0–15.0)
Lymphocytes Relative: 26.4 % (ref 12.0–46.0)
Lymphs Abs: 2.4 10*3/uL (ref 0.7–4.0)
MCHC: 32.7 g/dL (ref 30.0–36.0)
MCV: 84.4 fl (ref 78.0–100.0)
Monocytes Absolute: 0.6 10*3/uL (ref 0.1–1.0)
Monocytes Relative: 7.1 % (ref 3.0–12.0)
Neutro Abs: 6 10*3/uL (ref 1.4–7.7)
Neutrophils Relative %: 65.7 % (ref 43.0–77.0)
Platelets: 359 10*3/uL (ref 150.0–400.0)
RBC: 4.37 Mil/uL (ref 3.87–5.11)
RDW: 30.1 % — ABNORMAL HIGH (ref 11.5–15.5)
WBC: 9.1 10*3/uL (ref 4.0–10.5)

## 2021-06-15 LAB — HEPATIC FUNCTION PANEL
ALT: 18 U/L (ref 0–35)
AST: 18 U/L (ref 0–37)
Albumin: 4.3 g/dL (ref 3.5–5.2)
Alkaline Phosphatase: 85 U/L (ref 39–117)
Bilirubin, Direct: 0.1 mg/dL (ref 0.0–0.3)
Total Bilirubin: 0.3 mg/dL (ref 0.2–1.2)
Total Protein: 7 g/dL (ref 6.0–8.3)

## 2021-06-15 LAB — IBC PANEL
Iron: 67 ug/dL (ref 42–145)
Saturation Ratios: 21.7 % (ref 20.0–50.0)
Transferrin: 221 mg/dL (ref 212.0–360.0)

## 2021-06-15 LAB — BASIC METABOLIC PANEL
BUN: 14 mg/dL (ref 6–23)
CO2: 30 mEq/L (ref 19–32)
Calcium: 9.3 mg/dL (ref 8.4–10.5)
Chloride: 101 mEq/L (ref 96–112)
Creatinine, Ser: 0.66 mg/dL (ref 0.40–1.20)
GFR: 96.65 mL/min (ref >60.00)
Glucose, Bld: 75 mg/dL (ref 70–99)
Potassium: 4.2 mEq/L (ref 3.5–5.1)
Sodium: 139 mEq/L (ref 135–145)

## 2021-06-15 LAB — FERRITIN: Ferritin: 74 ng/mL (ref 10.0–291.0)

## 2021-06-15 MED ORDER — METHYLPREDNISOLONE ACETATE 80 MG/ML IJ SUSP
80.0000 mg | Freq: Once | INTRAMUSCULAR | Status: AC
  Administered 2021-06-15: 80 mg via INTRAMUSCULAR

## 2021-06-15 NOTE — Progress Notes (Signed)
Patient ID: [name], female   DOB: [birth year], 60 y.o.   MRN: [id number]        Chief Complaint: follow up anemia, allergies       HPI:  [name] is a 60 y.o. female here overall doing ok.  No overt bleeding, no s/p June 24 egd and colnoscopy   Pt denies chest pain, increased sob or doe, wheezing, orthopnea, PND, increased LE swelling, palpitations, dizziness or syncope.   Pt denies polydipsia, polyuria, or new focal neuro s/s.   Pt denies fever, wt loss, night sweats, loss of appetite, or other constitutional symptoms  Does have several wks ongoing nasal allergy symptoms with clearish congestion, itch and sneezing, without fever, pain, ST, cough, swelling or wheezing.  Due for prevnar 20.  No other new complaints       Wt Readings from Last 3 Encounters:  06/15/21 148 lb (67.1 kg)  06/09/21 149 lb (67.6 kg)  05/24/21 149 lb 2 oz (67.6 kg)   BP Readings from Last 3 Encounters:  06/15/21 120/74  06/09/21 (!) 141/95  05/24/21 110/70         Past Medical History:  Diagnosis Date   ALLERGIC RHINITIS 05/12/2009   Qualifier: Diagnosis of  By: Lenn Cal Deborra Medina), [name]     Allergy    Anemia, iron deficiency 12/22/2014   pt. denies   Anginal pain ([place])    Anxiety    Arthritis    Asthma    ASTHMA 05/12/2009   Severe AFL (Spirometry 05/2009: pre-BD FEV1 0.87L 34% pred, post-BD FEV1 1.11L 44% pred) Volumes hyperinflated Decreased DLCO that does not fully correct to normal range for alveolar volume.      Bipolar disorder ([place])    COPD 08/24/2009   Qualifier: Diagnosis of  By: Burnett Kanaris     COPD (chronic obstructive pulmonary disease) ([place])    Depression    Dyspnea    sometimes when exerting self   Fibromyalgia 05/14/2014   GERD (gastroesophageal reflux disease)    Headache    History of kidney stones    Hyperlipidemia 04/20/2017   HYPERTENSION 05/12/2009   Qualifier: Diagnosis of  By: Lenn Cal Deborra Medina), [name]     Kidney stones    Peripheral vascular  disease ([place])    Pneumonia    Prediabetes 02/23/2014   pt. denies   Seizure ([place])    Seizure ([place])    Stroke ([place]) 11/2020   Urticaria    Past Surgical History:  Procedure Laterality Date   ABDOMINAL HYSTERECTOMY     ANTERIOR CERVICAL DECOMP/DISCECTOMY FUSION N/A 07/28/2020   Procedure: ANTERIOR CERVICAL DECOMPRESSION/DISCECTOMY FUSION. INTERBODY PROTHESIS, PLATE/SCREWS CERVICAL THREE- CERVICAL FOUR, CERVICAL FOUR- CERVICAL FIVE;  Surgeon: Newman Pies, MD;  Location: [facility];  Service: Neurosurgery;  Laterality: N/A;   BACK SURGERY     COLONOSCOPY  12/20/2011   Procedure: COLONOSCOPY;  Surgeon: Landry Dyke, MD;  Location: [facility];  Service: Endoscopy;  Laterality: N/A;   COLONOSCOPY  03/05/2012   Procedure: COLONOSCOPY;  Surgeon: Landry Dyke, MD;  Location: [facility];  Service: Endoscopy;  Laterality: N/A;   DIAGNOSTIC LAPAROSCOPY     HEMORRHOID SURGERY     INCISE AND DRAIN ABCESS     KIDNEY STONE SURGERY     NECK SURGERY     x 2 Dr Orinda Kenner   SPINE SURGERY  2019   TOE SURGERY     TUBAL LIGATION      reports that she quit smoking about  2 years ago. Her smoking use included cigarettes. She has a 9.00 pack-year smoking history. She has never used smokeless tobacco. She reports previous alcohol use. She reports current drug use. Drug: Marijuana. family history includes Alcohol abuse in her brother and father; Anemia in her daughter, sister, and sister; Anesthesia problems in her father; Asthma in her brother, mother, and sister; Brain cancer in her sister; COPD in her mother and sister; Colon cancer (age of onset: 54) in her father; Diabetes in her brother, father, mother, sister, sister, sister, and sister; Heart disease in her brother, brother, mother, and sister; Hypertension in her brother, daughter, sister, sister, sister, sister, and sister; Kidney disease in her father; Sleep apnea in her brother. Allergies  Allergen Reactions   Sulfa Antibiotics Nausea And  Vomiting   Sulfonamide Derivatives Nausea And Vomiting    REACTION: n/v   Venofer [Iron Sucrose] Hives   Current Outpatient Medications on File Prior to Visit  Medication Sig Dispense Refill   albuterol (PROVENTIL) (2.5 MG/3ML) 0.083% nebulizer solution Take 3 mLs (2.5 mg total) by nebulization every 6 (six) hours as needed for wheezing or shortness of breath. Reported on 06/04/2016 120 mL 11   albuterol (VENTOLIN HFA) 108 (90 Base) MCG/ACT inhaler INHALE 4 PUFFS INTO THE LUNGS EVERY 4 HOURS AS NEEDED FOR WHEEZING OR SHORTNESS OF BREATH 18 g 2   atorvastatin (LIPITOR) 40 MG tablet Take 1 tablet (40 mg total) by mouth daily. 90 tablet 3   Biotin 1000 MCG tablet Take 1,000 mcg by mouth daily.     Budeson-Glycopyrrol-Formoterol (BREZTRI AEROSPHERE) 160-9-4.8 MCG/ACT AERO INHALE 2 PUFFS INTO THE LUNGS TWICE DAILY WITH SPACER 10.7 g 1   carbamazepine (TEGRETOL) 200 MG tablet Take 2 tablets (400 mg total) by mouth 2 (two) times daily. 120 tablet 12   cetirizine (ZYRTEC) 10 MG tablet Can take one tablet by mouth once daily if needed for runny nose. 30 tablet 5   Cholecalciferol (VITAMIN D) 125 MCG (5000 UT) CAPS Take 5,000 Units by mouth daily.     cyclobenzaprine (FLEXERIL) 5 MG tablet TAKE 1 TABLET(5 MG) BY MOUTH THREE TIMES DAILY AS NEEDED FOR MUSCLE SPASMS 60 tablet 2   dextromethorphan-guaiFENesin (MUCINEX DM) 30-600 MG 12hr tablet Take 1 tablet by mouth 2 (two) times daily.     diclofenac Sodium (VOLTAREN) 1 % GEL Apply 4 g topically 4 (four) times daily. (Patient taking differently: Apply 4 g topically 4 (four) times daily as needed (pain).) 100 g 0   docusate sodium (COLACE) 100 MG capsule Take 1 capsule (100 mg total) by mouth 2 (two) times daily. 60 capsule 0   doxepin (SINEQUAN) 25 MG capsule TAKE 1 CAPSULE(25 MG) BY MOUTH AT BEDTIME AS NEEDED FOR ITCHING 30 capsule 2   estradiol (ESTRACE) 0.5 MG tablet TAKE 1 TABLET BY MOUTH DAILY 90 tablet 4   famotidine (PEPCID) 20 MG tablet TAKE 1  TABLET(20 MG) BY MOUTH TWICE DAILY 64 tablet 5   fluticasone (FLONASE) 50 MCG/ACT nasal spray SHAKE LIQUID AND USE 2 SPRAYS IN EACH NOSTRIL DAILY 16 g 5   guaiFENesin-codeine 100-10 MG/5ML syrup Take 10 mLs by mouth 3 (three) times daily as needed for cough. 120 mL 0   hydrochlorothiazide (MICROZIDE) 12.5 MG capsule TAKE 1 CAPSULE(12.5 MG) BY MOUTH DAILY 90 capsule 3   hydrOXYzine (ATARAX/VISTARIL) 50 MG tablet Take 1 tablet (50 mg total) by mouth at bedtime. 90 tablet 1   hydrOXYzine (VISTARIL) 25 MG capsule      ipratropium-albuterol (  DUONEB) 0.5-2.5 (3) MG/3ML SOLN Take 3 mLs by nebulization every 6 (six) hours as needed. 60 mL 0   irbesartan (AVAPRO) 75 MG tablet Take 1 tablet (75 mg total) by mouth daily. 90 tablet 3   montelukast (SINGULAIR) 10 MG tablet TAKE 1 TABLET(10 MG) BY MOUTH AT BEDTIME 30 tablet 2   nystatin (MYCOSTATIN) 100000 UNIT/ML suspension SHAKE WELL AND GIVE 5 ML BY MOUTH EVERY 6 HOURS FOR 7 DAYS 60 mL 0   Olopatadine HCl (PATADAY) 0.2 % SOLN Place 1 drop into both eyes 1 day or 1 dose. 2.5 mL 3   ondansetron (ZOFRAN ODT) 4 MG disintegrating tablet Take 1 tablet (4 mg total) by mouth every 8 (eight) hours as needed for nausea or vomiting. 20 tablet 0   pantoprazole (PROTONIX) 40 MG tablet TAKE 1 TABLET BY MOUTH EVERY DAY 90 tablet 1   potassium chloride (KLOR-CON) 10 MEQ tablet Take 1 tablet (10 mEq total) by mouth daily. 90 tablet 3   pravastatin (PRAVACHOL) 40 MG tablet Take 40 mg by mouth daily as needed.     TEZSPIRE 210 MG/1.91ML SOSY Inject into the skin.     triamcinolone cream (KENALOG) 0.1 % APPLY TOPICALLY TO THE AFFECTED AREA TWICE DAILY 30 g 1   UNKNOWN TO PATIENT Weekly allergy shots     vitamin C (ASCORBIC ACID) 500 MG tablet Take 500 mg by mouth daily.      aspirin EC 81 MG tablet Take 81 mg by mouth daily. Swallow whole. (Patient not taking: Reported on 06/15/2021)     oxyCODONE-acetaminophen (PERCOCET/ROXICET) 5-325 MG tablet Take 1-2 tablets by mouth every  4 (four) hours as needed for moderate pain. (Patient not taking: Reported on 06/15/2021) 30 tablet 0   Current Facility-Administered Medications on File Prior to Visit  Medication Dose Route Frequency Provider Last Rate Last Admin   tezepelumab-ekko (TEZSPIRE) 210 MG/1.91ML syringe 210 mg  210 mg Subcutaneous Q28 days Valentina Shaggy, MD   210 mg at 06/13/21 1121        ROS:  All others reviewed and negative.  Objective        PE:  BP 120/74 (BP Location: Left Arm, Patient Position: Sitting, Cuff Size: Normal)   Pulse 80   Ht 5\' 2"  (1.575 m)   Wt 148 lb (67.1 kg)   SpO2 96%   BMI 27.07 kg/m                 Constitutional: Pt appears in NAD               HENT: Head: NCAT.                Right Ear: External ear normal.                 Left Ear: External ear normal.                Eyes: . Pupils are equal, round, and reactive to light. Conjunctivae and EOM are normal               Nose: without d/c or deformity               Neck: Neck supple. Gross normal ROM               Cardiovascular: Normal rate and regular rhythm.                 Pulmonary/Chest: Effort normal and breath sounds without rales  or wheezing.                Abd:  Soft, NT, ND, + BS, no organomegaly               Neurological: Pt is alert. At baseline orientation, motor grossly intact               Skin: Skin is warm. No rashes, no other new lesions, LE edema - none               Psychiatric: Pt behavior is normal without agitation   Micro: none  Cardiac tracings I have personally interpreted today:  none  Pertinent Radiological findings (summarize): none   Lab Results  Component Value Date   WBC 9.1 06/15/2021   HGB 12.1 06/15/2021   HCT 36.9 06/15/2021   PLT 359.0 06/15/2021   GLUCOSE 75 06/15/2021   CHOL 142 04/11/2021   TRIG 47.0 04/11/2021   HDL 64.30 04/11/2021   LDLCALC 69 04/11/2021   ALT 18 06/15/2021   AST 18 06/15/2021   NA 139 06/15/2021   K 4.2 06/15/2021   CL 101 06/15/2021    CREATININE 0.66 06/15/2021   BUN 14 06/15/2021   CO2 30 06/15/2021   TSH 0.47 04/11/2021   HGBA1C 6.2 04/11/2021   MICROALBUR 0.9 04/11/2021   Assessment/Plan:  [name] is a 60 y.o. Black or African American [2] female with  has a past medical history of ALLERGIC RHINITIS (05/12/2009), Allergy, Anemia, iron deficiency (12/22/2014), Anginal pain ([place]), Anxiety, Arthritis, Asthma, ASTHMA (05/12/2009), Bipolar disorder ([place]), COPD (08/24/2009), COPD (chronic obstructive pulmonary disease) ([place]), Depression, Dyspnea, Fibromyalgia (05/14/2014), GERD (gastroesophageal reflux disease), Headache, History of kidney stones, Hyperlipidemia (04/20/2017), HYPERTENSION (05/12/2009), Kidney stones, Peripheral vascular disease ([place]), Pneumonia, Prediabetes (02/23/2014), Seizure ([place]), Seizure ([place]), Stroke ([place]) (11/2020), and Urticaria.  Anemia, iron deficiency No overt bleeding, for f/u lab today,  to f/u any worsening symptoms or concerns  Allergic rhinitis Mod to severe seasonal flare, for depomedrol IM 80, to f/u any worsening symptoms or concerns  COPD/asthma Stable, cont current med tx - albuterol prn   Prediabetes Lab Results  Component Value Date   HGBA1C 6.2 04/11/2021   Stable, pt to continue current medical treatment  - diet  Followup: Return in about 6 months (around 12/15/2021).  Cathlean Cower, MD 06/19/2021 1:02 PM [facility]

## 2021-06-15 NOTE — Patient Instructions (Signed)
You had the steroid shot today for allergies, and the Prevnar 20 pneumonia shot as well  Please continue all other medications as before, and refills have been done if requested.  Please have the pharmacy call with any other refills you may need.  Please keep your appointments with your specialists as you may have planned - the CT scan ordered per GI  Please go to the LAB at the blood drawing area for the tests to be done  You will be contacted by phone if any changes need to be made immediately.  Otherwise, you will receive a letter about your results with an explanation, but please check with MyChart first.  Please remember to sign up for MyChart if you have not done so, as this will be important to you in the future with finding out test results, communicating by private email, and scheduling acute appointments online when needed.  Please make an Appointment to return in 6 months, or sooner if needed

## 2021-06-16 ENCOUNTER — Ambulatory Visit (HOSPITAL_COMMUNITY): Payer: Medicare HMO

## 2021-06-16 ENCOUNTER — Encounter: Payer: Self-pay | Admitting: Internal Medicine

## 2021-06-19 ENCOUNTER — Encounter: Payer: Self-pay | Admitting: Internal Medicine

## 2021-06-19 NOTE — Assessment & Plan Note (Signed)
Lab Results  Component Value Date   HGBA1C 6.2 04/11/2021   Stable, pt to continue current medical treatment  - diet

## 2021-06-19 NOTE — Assessment & Plan Note (Signed)
Mod to severe seasonal flare, for depomedrol IM 80, to f/u any worsening symptoms or concerns

## 2021-06-19 NOTE — Assessment & Plan Note (Signed)
No overt bleeding, for f/u lab today,  to f/u any worsening symptoms or concerns

## 2021-06-19 NOTE — Assessment & Plan Note (Signed)
Stable, cont current med tx - albuterol prn

## 2021-06-20 DIAGNOSIS — Z981 Arthrodesis status: Secondary | ICD-10-CM | POA: Diagnosis not present

## 2021-06-21 ENCOUNTER — Other Ambulatory Visit: Payer: Self-pay

## 2021-06-21 ENCOUNTER — Encounter: Payer: Self-pay | Admitting: Internal Medicine

## 2021-06-21 DIAGNOSIS — A048 Other specified bacterial intestinal infections: Secondary | ICD-10-CM

## 2021-06-21 MED ORDER — RIFABUTIN 150 MG PO CAPS: 300.0000 mg | ORAL_CAPSULE | Freq: Every day | ORAL | 0 refills | Status: AC

## 2021-06-21 MED ORDER — AMOXICILLIN 500 MG PO TABS: 1000.0000 mg | ORAL_TABLET | Freq: Two times a day (BID) | ORAL | 0 refills | Status: AC

## 2021-06-23 ENCOUNTER — Ambulatory Visit (HOSPITAL_COMMUNITY)
Admission: RE | Admit: 2021-06-23 | Discharge: 2021-06-23 | Disposition: A | Discharge status: Home / Self Care | Payer: Medicare HMO | Source: Ambulatory Visit | Attending: Gastroenterology | Admitting: Gastroenterology

## 2021-06-23 ENCOUNTER — Other Ambulatory Visit: Payer: Self-pay

## 2021-06-23 ENCOUNTER — Ambulatory Visit (INDEPENDENT_AMBULATORY_CARE_PROVIDER_SITE_OTHER): Payer: Medicare HMO

## 2021-06-23 DIAGNOSIS — M47814 Spondylosis without myelopathy or radiculopathy, thoracic region: Secondary | ICD-10-CM | POA: Diagnosis not present

## 2021-06-23 DIAGNOSIS — I2510 Atherosclerotic heart disease of native coronary artery without angina pectoris: Secondary | ICD-10-CM | POA: Diagnosis not present

## 2021-06-23 DIAGNOSIS — K6389 Other specified diseases of intestine: Secondary | ICD-10-CM | POA: Diagnosis not present

## 2021-06-23 DIAGNOSIS — R109 Unspecified abdominal pain: Secondary | ICD-10-CM | POA: Diagnosis not present

## 2021-06-23 DIAGNOSIS — J432 Centrilobular emphysema: Secondary | ICD-10-CM | POA: Diagnosis not present

## 2021-06-23 DIAGNOSIS — K3189 Other diseases of stomach and duodenum: Secondary | ICD-10-CM | POA: Diagnosis not present

## 2021-06-23 DIAGNOSIS — R634 Abnormal weight loss: Secondary | ICD-10-CM | POA: Diagnosis not present

## 2021-06-23 DIAGNOSIS — J309 Allergic rhinitis, unspecified: Secondary | ICD-10-CM

## 2021-06-23 DIAGNOSIS — K429 Umbilical hernia without obstruction or gangrene: Secondary | ICD-10-CM | POA: Diagnosis not present

## 2021-06-23 MED ORDER — IOHEXOL 300 MG/ML  SOLN
100.0000 mL | Freq: Once | INTRAMUSCULAR | Status: AC | PRN
  Administered 2021-06-23: 100 mL via INTRAVENOUS

## 2021-06-23 MED ORDER — SODIUM CHLORIDE (PF) 0.9 % IJ SOLN
INTRAMUSCULAR | Status: AC
  Filled 2021-06-23: qty 50

## 2021-06-26 ENCOUNTER — Other Ambulatory Visit: Payer: Self-pay

## 2021-06-26 ENCOUNTER — Encounter: Payer: Medicare HMO | Admitting: Gastroenterology

## 2021-06-26 DIAGNOSIS — D509 Iron deficiency anemia, unspecified: Secondary | ICD-10-CM

## 2021-06-26 DIAGNOSIS — R1319 Other dysphagia: Secondary | ICD-10-CM

## 2021-06-27 ENCOUNTER — Ambulatory Visit (INDEPENDENT_AMBULATORY_CARE_PROVIDER_SITE_OTHER): Payer: Medicare HMO | Admitting: *Deleted

## 2021-06-27 DIAGNOSIS — J309 Allergic rhinitis, unspecified: Secondary | ICD-10-CM | POA: Diagnosis not present

## 2021-06-28 ENCOUNTER — Other Ambulatory Visit: Payer: Self-pay

## 2021-06-28 ENCOUNTER — Telehealth (INDEPENDENT_AMBULATORY_CARE_PROVIDER_SITE_OTHER): Payer: Medicare HMO | Admitting: Psychiatry

## 2021-06-28 DIAGNOSIS — F3130 Bipolar disorder, current episode depressed, mild or moderate severity, unspecified: Secondary | ICD-10-CM | POA: Diagnosis not present

## 2021-06-28 MED ORDER — CARBAMAZEPINE 200 MG PO TABS: 400.0000 mg | ORAL_TABLET | Freq: Two times a day (BID) | ORAL | 12 refills | Status: DC

## 2021-06-28 MED ORDER — HYDROXYZINE PAMOATE 25 MG PO CAPS: 25.0000 mg | ORAL_CAPSULE | Freq: Three times a day (TID) | ORAL | 6 refills | Status: DC | PRN
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
## 2021-06-30 ENCOUNTER — Other Ambulatory Visit: Payer: Self-pay

## 2021-06-30 ENCOUNTER — Ambulatory Visit (HOSPITAL_COMMUNITY)
Admission: RE | Admit: 2021-06-30 | Discharge: 2021-06-30 | Disposition: A | Discharge status: Home / Self Care | Payer: Medicare HMO | Source: Ambulatory Visit | Attending: Gastroenterology | Admitting: Gastroenterology

## 2021-06-30 DIAGNOSIS — R1310 Dysphagia, unspecified: Secondary | ICD-10-CM | POA: Diagnosis not present

## 2021-06-30 DIAGNOSIS — R1319 Other dysphagia: Secondary | ICD-10-CM | POA: Insufficient documentation

## 2021-07-04 DIAGNOSIS — H5203 Hypermetropia, bilateral: Secondary | ICD-10-CM | POA: Diagnosis not present

## 2021-07-04 DIAGNOSIS — H524 Presbyopia: Secondary | ICD-10-CM | POA: Diagnosis not present

## 2021-07-04 DIAGNOSIS — E119 Type 2 diabetes mellitus without complications: Secondary | ICD-10-CM | POA: Diagnosis not present

## 2021-07-04 LAB — HM DIABETES EYE EXAM

## 2021-07-05 ENCOUNTER — Encounter: Payer: Self-pay | Admitting: Diagnostic Neuroimaging

## 2021-07-05 ENCOUNTER — Other Ambulatory Visit: Payer: Self-pay

## 2021-07-05 ENCOUNTER — Ambulatory Visit (INDEPENDENT_AMBULATORY_CARE_PROVIDER_SITE_OTHER): Payer: Medicare HMO | Admitting: Diagnostic Neuroimaging

## 2021-07-05 ENCOUNTER — Ambulatory Visit (INDEPENDENT_AMBULATORY_CARE_PROVIDER_SITE_OTHER): Payer: Medicare HMO | Admitting: Internal Medicine

## 2021-07-05 VITALS — BP 110/80 | HR 67 | Temp 98.4°F | Ht 63.0 in | Wt 145.4 lb

## 2021-07-05 VITALS — BP 108/76 | HR 67 | Ht 63.0 in | Wt 146.2 lb

## 2021-07-05 DIAGNOSIS — J069 Acute upper respiratory infection, unspecified: Secondary | ICD-10-CM | POA: Diagnosis not present

## 2021-07-05 DIAGNOSIS — M79605 Pain in left leg: Secondary | ICD-10-CM

## 2021-07-05 DIAGNOSIS — J441 Chronic obstructive pulmonary disease with (acute) exacerbation: Secondary | ICD-10-CM | POA: Diagnosis not present

## 2021-07-05 DIAGNOSIS — M79604 Pain in right leg: Secondary | ICD-10-CM | POA: Diagnosis not present

## 2021-07-05 DIAGNOSIS — I700 Atherosclerosis of aorta: Secondary | ICD-10-CM | POA: Diagnosis not present

## 2021-07-05 DIAGNOSIS — M48062 Spinal stenosis, lumbar region with neurogenic claudication: Secondary | ICD-10-CM | POA: Diagnosis not present

## 2021-07-05 DIAGNOSIS — R7303 Prediabetes: Secondary | ICD-10-CM | POA: Diagnosis not present

## 2021-07-05 DIAGNOSIS — I10 Essential (primary) hypertension: Secondary | ICD-10-CM

## 2021-07-05 HISTORY — DX: Acute upper respiratory infection, unspecified: J06.9

## 2021-07-05 MED ORDER — LEVOFLOXACIN 500 MG PO TABS: 500.0000 mg | ORAL_TABLET | Freq: Every day | ORAL | 0 refills | Status: AC

## 2021-07-05 NOTE — Patient Instructions (Signed)
Please take all new medication as prescribed - the levaquin  You can also take Delsym OTC for cough, and/or Mucinex (or it's generic off brand) for congestion, and tylenol as needed for pain.  Please also check with [facility] to see if they can check your for COVID  Please continue all other medications as before, and refills have been done if requested.  Please have the pharmacy call with any other refills you may need.  Please continue your efforts at being more active, low cholesterol diet, and weight control.  Please keep your appointments with your specialists as you may have planned

## 2021-07-05 NOTE — Progress Notes (Signed)
Patient ID: [name], female   DOB: 02-May-1961, 60 y.o.   MRN: [id number]        Chief Complaint: follow up URI symptoms, copd, aortic atheroscerosis, preDM, htn       HPI:  [name] is a 60 y.o. female here with c/o  Here with 2-3 days acute onset fever, facial pain, pressure, headache, general weakness and malaise, and greenish d/c, with mild ST and cough, but pt denies chest pain, wheezing, increased sob or doe, orthopnea, PND, increased LE swelling, palpitations, dizziness or syncope.   Pt denies polydipsia, polyuria, or new focal neuro s/s   no other new complaints        Wt Readings from Last 3 Encounters:  07/06/21 145 lb 8.1 oz (66 kg)  07/05/21 145 lb 6.4 oz (66 kg)  07/05/21 146 lb 3.2 oz (66.3 kg)   BP Readings from Last 3 Encounters:  07/06/21 92/64  07/05/21 110/80  07/05/21 108/76         Past Medical History:  Diagnosis Date   ALLERGIC RHINITIS 05/12/2009   Qualifier: Diagnosis of  By: Lenn Cal Deborra Medina), [name]     Allergy    Anemia, iron deficiency 12/22/2014   pt. denies   Anginal pain ([place])    Anxiety    Arthritis    Asthma    ASTHMA 05/12/2009   Severe AFL (Spirometry 05/2009: pre-BD FEV1 0.87L 34% pred, post-BD FEV1 1.11L 44% pred) Volumes hyperinflated Decreased DLCO that does not fully correct to normal range for alveolar volume.      Bipolar disorder ([place])    COPD 08/24/2009   Qualifier: Diagnosis of  By: Burnett Kanaris     COPD (chronic obstructive pulmonary disease) ([place])    Depression    Dyspnea    sometimes when exerting self   Fibromyalgia 05/14/2014   GERD (gastroesophageal reflux disease)    Headache    History of kidney stones    Hyperlipidemia 04/20/2017   HYPERTENSION 05/12/2009   Qualifier: Diagnosis of  By: Lenn Cal Deborra Medina), [name]     Kidney stones    Peripheral vascular disease ([place])    Pneumonia    Prediabetes 02/23/2014   pt. denies   Seizure ([place])    Seizure ([place])    Stroke ([place]) 11/2020   Urticaria     Past Surgical History:  Procedure Laterality Date   ABDOMINAL HYSTERECTOMY     ANTERIOR CERVICAL DECOMP/DISCECTOMY FUSION N/A 07/28/2020   Procedure: ANTERIOR CERVICAL DECOMPRESSION/DISCECTOMY FUSION. INTERBODY PROTHESIS, PLATE/SCREWS CERVICAL THREE- CERVICAL FOUR, CERVICAL FOUR- CERVICAL FIVE;  Surgeon: Newman Pies, MD;  Location: [facility];  Service: Neurosurgery;  Laterality: N/A;   BACK SURGERY     COLONOSCOPY  12/20/2011   Procedure: COLONOSCOPY;  Surgeon: Landry Dyke, MD;  Location: [facility];  Service: Endoscopy;  Laterality: N/A;   COLONOSCOPY  03/05/2012   Procedure: COLONOSCOPY;  Surgeon: Landry Dyke, MD;  Location: [facility];  Service: Endoscopy;  Laterality: N/A;   DIAGNOSTIC LAPAROSCOPY     HEMORRHOID SURGERY     INCISE AND DRAIN ABCESS     KIDNEY STONE SURGERY     NECK SURGERY     x 2 Dr Orinda Kenner   SPINE SURGERY  2019   TOE SURGERY     TUBAL LIGATION      reports that she quit smoking about 2 years ago. Her smoking use included cigarettes. She has a 9.00 pack-year smoking history. She has never used smokeless tobacco. She reports  previous alcohol use. She reports current drug use. Drug: Marijuana. family history includes Alcohol abuse in her brother and father; Anemia in her daughter, sister, and sister; Anesthesia problems in her father; Asthma in her brother, mother, and sister; Brain cancer in her sister; COPD in her mother and sister; Colon cancer (age of onset: 50) in her father; Diabetes in her brother, father, mother, sister, sister, sister, and sister; Heart disease in her brother, brother, mother, and sister; Hypertension in her brother, daughter, sister, sister, sister, sister, and sister; Kidney disease in her father; Sleep apnea in her brother. Allergies  Allergen Reactions   Sulfa Antibiotics Nausea And Vomiting   Sulfonamide Derivatives Nausea And Vomiting    REACTION: n/v   Venofer [Iron Sucrose] Hives   Current Outpatient Medications on  File Prior to Visit  Medication Sig Dispense Refill   albuterol (PROVENTIL) (2.5 MG/3ML) 0.083% nebulizer solution Take 3 mLs (2.5 mg total) by nebulization every 6 (six) hours as needed for wheezing or shortness of breath. Reported on 06/04/2016 120 mL 11   albuterol (VENTOLIN HFA) 108 (90 Base) MCG/ACT inhaler INHALE 4 PUFFS INTO THE LUNGS EVERY 4 HOURS AS NEEDED FOR WHEEZING OR SHORTNESS OF BREATH 18 g 2   aspirin EC 81 MG tablet Take 81 mg by mouth daily. Swallow whole.     atorvastatin (LIPITOR) 40 MG tablet Take 1 tablet (40 mg total) by mouth daily. 90 tablet 3   Biotin 1000 MCG tablet Take 1,000 mcg by mouth daily.     Budeson-Glycopyrrol-Formoterol (BREZTRI AEROSPHERE) 160-9-4.8 MCG/ACT AERO INHALE 2 PUFFS INTO THE LUNGS TWICE DAILY WITH SPACER 10.7 g 1   carbamazepine (TEGRETOL) 200 MG tablet Take 2 tablets (400 mg total) by mouth 2 (two) times daily. 120 tablet 12   cetirizine (ZYRTEC) 10 MG tablet Can take one tablet by mouth once daily if needed for runny nose. 30 tablet 5   Cholecalciferol (VITAMIN D) 125 MCG (5000 UT) CAPS Take 5,000 Units by mouth daily.     cyclobenzaprine (FLEXERIL) 5 MG tablet TAKE 1 TABLET(5 MG) BY MOUTH THREE TIMES DAILY AS NEEDED FOR MUSCLE SPASMS 60 tablet 2   dextromethorphan-guaiFENesin (MUCINEX DM) 30-600 MG 12hr tablet Take 1 tablet by mouth 2 (two) times daily.     diclofenac Sodium (VOLTAREN) 1 % GEL Apply 4 g topically 4 (four) times daily. (Patient taking differently: Apply 4 g topically 4 (four) times daily as needed (pain).) 100 g 0   docusate sodium (COLACE) 100 MG capsule Take 1 capsule (100 mg total) by mouth 2 (two) times daily. 60 capsule 0   estradiol (ESTRACE) 0.5 MG tablet TAKE 1 TABLET BY MOUTH DAILY 90 tablet 4   famotidine (PEPCID) 20 MG tablet TAKE 1 TABLET(20 MG) BY MOUTH TWICE DAILY 64 tablet 5   fluticasone (FLONASE) 50 MCG/ACT nasal spray SHAKE LIQUID AND USE 2 SPRAYS IN EACH NOSTRIL DAILY 16 g 5   guaiFENesin-codeine 100-10 MG/5ML  syrup Take 10 mLs by mouth 3 (three) times daily as needed for cough. 120 mL 0   hydrochlorothiazide (MICROZIDE) 12.5 MG capsule TAKE 1 CAPSULE(12.5 MG) BY MOUTH DAILY 90 capsule 3   hydrOXYzine (ATARAX/VISTARIL) 50 MG tablet Take 1 tablet (50 mg total) by mouth at bedtime. 90 tablet 1   hydrOXYzine (VISTARIL) 25 MG capsule Take 1 capsule (25 mg total) by mouth every 8 (eight) hours as needed. 30 capsule 6   ipratropium-albuterol (DUONEB) 0.5-2.5 (3) MG/3ML SOLN Take 3 mLs by nebulization every 6 (six) hours  as needed. 60 mL 0   irbesartan (AVAPRO) 75 MG tablet Take 1 tablet (75 mg total) by mouth daily. 90 tablet 3   montelukast (SINGULAIR) 10 MG tablet TAKE 1 TABLET(10 MG) BY MOUTH AT BEDTIME 30 tablet 2   nystatin (MYCOSTATIN) 100000 UNIT/ML suspension SHAKE WELL AND GIVE 5 ML BY MOUTH EVERY 6 HOURS FOR 7 DAYS 60 mL 0   Olopatadine HCl (PATADAY) 0.2 % SOLN Place 1 drop into both eyes 1 day or 1 dose. 2.5 mL 3   ondansetron (ZOFRAN ODT) 4 MG disintegrating tablet Take 1 tablet (4 mg total) by mouth every 8 (eight) hours as needed for nausea or vomiting. 20 tablet 0   oxyCODONE-acetaminophen (PERCOCET/ROXICET) 5-325 MG tablet Take 1-2 tablets by mouth every 4 (four) hours as needed for moderate pain. 30 tablet 0   pantoprazole (PROTONIX) 40 MG tablet TAKE 1 TABLET BY MOUTH EVERY DAY 90 tablet 1   potassium chloride (KLOR-CON) 10 MEQ tablet Take 1 tablet (10 mEq total) by mouth daily. 90 tablet 3   pravastatin (PRAVACHOL) 40 MG tablet Take 40 mg by mouth daily as needed.     TEZSPIRE 210 MG/1.91ML SOSY Inject into the skin.     triamcinolone cream (KENALOG) 0.1 % APPLY TOPICALLY TO THE AFFECTED AREA TWICE DAILY 30 g 1   UNKNOWN TO PATIENT Weekly allergy shots     vitamin C (ASCORBIC ACID) 500 MG tablet Take 500 mg by mouth daily.      Current Facility-Administered Medications on File Prior to Visit  Medication Dose Route Frequency Provider Last Rate Last Admin   tezepelumab-ekko (TEZSPIRE) 210  MG/1.91ML syringe 210 mg  210 mg Subcutaneous Q28 days Valentina Shaggy, MD   210 mg at 06/13/21 1121        ROS:  All others reviewed and negative.  Objective        PE:  BP 110/80 (BP Location: Left Arm, Patient Position: Sitting, Cuff Size: Normal)   Pulse 67   Temp 98.4 F (36.9 C) (Oral)   Ht 5\' 3"  (1.6 m)   Wt 145 lb 6.4 oz (66 kg)   SpO2 95%   BMI 25.76 kg/m                 Constitutional: Pt appears in NAD               HENT: Head: NCAT.                Right Ear: External ear normal.                 Left Ear: External ear normal. Bilat tm's with mild erythema.  Max sinus areas non tender.  Pharynx with mild erythema, no exudate               Eyes: . Pupils are equal, round, and reactive to light. Conjunctivae and EOM are normal               Nose: without d/c or deformity               Neck: Neck supple. Gross normal ROM               Cardiovascular: Normal rate and regular rhythm.                 Pulmonary/Chest: Effort normal and breath sounds without rales or wheezing.  Abd:  Soft, NT, ND, + BS, no organomegaly               Neurological: Pt is alert. At baseline orientation, motor grossly intact               Skin: Skin is warm. No rashes, no other new lesions, LE edema - none               Psychiatric: Pt behavior is normal without agitation   Micro: none  Cardiac tracings I have personally interpreted today:  none  Pertinent Radiological findings (summarize): none   Lab Results  Component Value Date   WBC 9.1 06/15/2021   HGB 12.1 06/15/2021   HCT 36.9 06/15/2021   PLT 359.0 06/15/2021   GLUCOSE 75 06/15/2021   CHOL 142 04/11/2021   TRIG 47.0 04/11/2021   HDL 64.30 04/11/2021   LDLCALC 69 04/11/2021   ALT 18 06/15/2021   AST 18 06/15/2021   NA 139 06/15/2021   K 4.2 06/15/2021   CL 101 06/15/2021   CREATININE 0.66 06/15/2021   BUN 14 06/15/2021   CO2 30 06/15/2021   TSH 0.47 04/11/2021   HGBA1C 6.2 04/11/2021   MICROALBUR 0.9  04/11/2021   Assessment/Plan:  [name] is a 60 y.o. Black or African American [2] female with  has a past medical history of ALLERGIC RHINITIS (05/12/2009), Allergy, Anemia, iron deficiency (12/22/2014), Anginal pain ([place]), Anxiety, Arthritis, Asthma, ASTHMA (05/12/2009), Bipolar disorder ([place]), COPD (08/24/2009), COPD (chronic obstructive pulmonary disease) ([place]), Depression, Dyspnea, Fibromyalgia (05/14/2014), GERD (gastroesophageal reflux disease), Headache, History of kidney stones, Hyperlipidemia (04/20/2017), HYPERTENSION (05/12/2009), Kidney stones, Peripheral vascular disease ([place]), Pneumonia, Prediabetes (02/23/2014), Seizure ([place]), Seizure ([place]), Stroke ([place]) (11/2020), and Urticaria.  URI (upper respiratory infection) Mild to mod, for antibx course,  to f/u any worsening symptoms or concerns  COPD/asthma Stable overall, cont current med tx  - albuterol hfa prn  Aortic atherosclerosis ([facility]) Pt to continue low chol diet, exercise and pravachol 40  Prediabetes Lab Results  Component Value Date   HGBA1C 6.2 04/11/2021   Stable, pt to continue current medical treatment  - diet   Essential (primary) hypertension BP Readings from Last 3 Encounters:  07/06/21 92/64  07/05/21 110/80  07/05/21 108/76   Stable, pt to continue medical treatment hct, avapro,   Followup: Return if symptoms worsen or fail to improve.  Cathlean Cower, MD 07/09/2021 3:23 PM [facility]
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
## 2021-07-06 ENCOUNTER — Emergency Department (HOSPITAL_COMMUNITY): Payer: Medicare HMO

## 2021-07-06 ENCOUNTER — Emergency Department (HOSPITAL_COMMUNITY)
Admission: EM | Admit: 2021-07-06 | Discharge: 2021-07-06 | Disposition: A | Discharge status: Home / Self Care | Payer: Medicare HMO | Attending: Emergency Medicine | Admitting: Emergency Medicine

## 2021-07-06 DIAGNOSIS — R059 Cough, unspecified: Secondary | ICD-10-CM | POA: Diagnosis not present

## 2021-07-06 DIAGNOSIS — R062 Wheezing: Secondary | ICD-10-CM | POA: Diagnosis not present

## 2021-07-06 DIAGNOSIS — Z79899 Other long term (current) drug therapy: Secondary | ICD-10-CM | POA: Insufficient documentation

## 2021-07-06 DIAGNOSIS — Z20822 Contact with and (suspected) exposure to covid-19: Secondary | ICD-10-CM | POA: Diagnosis not present

## 2021-07-06 DIAGNOSIS — J449 Chronic obstructive pulmonary disease, unspecified: Secondary | ICD-10-CM | POA: Diagnosis not present

## 2021-07-06 DIAGNOSIS — Z7951 Long term (current) use of inhaled steroids: Secondary | ICD-10-CM | POA: Insufficient documentation

## 2021-07-06 DIAGNOSIS — J45909 Unspecified asthma, uncomplicated: Secondary | ICD-10-CM | POA: Insufficient documentation

## 2021-07-06 DIAGNOSIS — Z7982 Long term (current) use of aspirin: Secondary | ICD-10-CM | POA: Insufficient documentation

## 2021-07-06 DIAGNOSIS — R0600 Dyspnea, unspecified: Secondary | ICD-10-CM | POA: Diagnosis not present

## 2021-07-06 DIAGNOSIS — R0602 Shortness of breath: Secondary | ICD-10-CM | POA: Diagnosis not present

## 2021-07-06 DIAGNOSIS — R531 Weakness: Secondary | ICD-10-CM | POA: Diagnosis not present

## 2021-07-06 DIAGNOSIS — I10 Essential (primary) hypertension: Secondary | ICD-10-CM | POA: Insufficient documentation

## 2021-07-06 DIAGNOSIS — Z87891 Personal history of nicotine dependence: Secondary | ICD-10-CM | POA: Insufficient documentation

## 2021-07-06 MED ORDER — PREDNISONE 10 MG PO TABS: 40.0000 mg | ORAL_TABLET | Freq: Every day | ORAL | 0 refills | Status: DC

## 2021-07-06 MED ORDER — PREDNISONE 20 MG PO TABS
60.0000 mg | ORAL_TABLET | Freq: Once | ORAL | Status: AC
  Administered 2021-07-06: 60 mg via ORAL
  Filled 2021-07-06: qty 3

## 2021-07-06 NOTE — ED Provider Notes (Signed)
[facility] DEPT Provider Note   CSN: [id number] Arrival date & time: 07/06/21  1326     History Chief Complaint  Patient presents with   Shortness of Breath    Pt comes from home was seen at PCP yesterday, placed on antibiotics, complaining of SOB with wheeze.     QUINA WILBOURNE is a 60 y.o. female.  60 year old female with prior medical history as detailed below presents for evaluation.  Patient complains of persistent cough and shortness of breath.  She was seen by her PCP yesterday.  She was placed on Levaquin for treatment of same.  She presents today by EMS.  She complained of increased wheezing and shortness of breath.  EMS reports that they gave her 1 breathing treatment while in route.  Patient reports that she feels improved.  Patient denies associated chest pain or fever.  She denies other illness.  Of note, patient reports that she was tested for COVID yesterday.  Her test was negative.  She request repeat Covid testing today.  The history is provided by the patient and medical records.  Shortness of Breath Severity:  Mild Onset quality:  Gradual Duration:  3 days Timing:  Constant Progression:  Worsening Chronicity:  Recurrent     Past Medical History:  Diagnosis Date   ALLERGIC RHINITIS 05/12/2009   Qualifier: Diagnosis of  By: Lenn Cal Deborra Medina), [name]     Allergy    Anemia, iron deficiency 12/22/2014   pt. denies   Anginal pain ([place])    Anxiety    Arthritis    Asthma    ASTHMA 05/12/2009   Severe AFL (Spirometry 05/2009: pre-BD FEV1 0.87L 34% pred, post-BD FEV1 1.11L 44% pred) Volumes hyperinflated Decreased DLCO that does not fully correct to normal range for alveolar volume.      Bipolar disorder ([place])    COPD 08/24/2009   Qualifier: Diagnosis of  By: Burnett Kanaris     COPD (chronic obstructive pulmonary disease) ([place])    Depression    Dyspnea    sometimes when exerting self   Fibromyalgia 05/14/2014   GERD  (gastroesophageal reflux disease)    Headache    History of kidney stones    Hyperlipidemia 04/20/2017   HYPERTENSION 05/12/2009   Qualifier: Diagnosis of  By: Lenn Cal Deborra Medina), [name]     Kidney stones    Peripheral vascular disease ([place])    Pneumonia    Prediabetes 02/23/2014   pt. denies   Seizure ([place])    Seizure ([place])    Stroke ([place]) 11/2020   Urticaria     Patient Active Problem List   Diagnosis Date Noted   Aortic atherosclerosis ([place]) 07/05/2021   URI (upper respiratory infection) 07/05/2021   Drug allergy 05/14/2021   Cough 05/14/2021   Bilateral leg weakness 04/17/2021   Status post cervical spinal fusion 12/27/2020   History of stroke 12/11/2020   Medication management 10/21/2020   Body mass index (BMI) 28.0-28.9, adult 08/23/2020   Healthcare maintenance 08/11/2020   Cervical spondylosis with myelopathy and radiculopathy 07/28/2020   Preop exam for internal medicine 06/19/2020   Hypotension due to medication 06/11/2020   Status asthmaticus 06/09/2020   Essential (primary) hypertension 05/17/2020   Other spondylosis with myelopathy, cervical region 02/16/2020   Cervical spondylosis 02/02/2020   Radiculopathy, cervical region 02/02/2020   Spondylolysis, cervical region 02/02/2020   Neck pain 02/02/2020   Leg cramping 08/12/2019   Laryngopharyngeal reflux (LPR) 05/26/2019   Sinus pressure 05/26/2019   Hypophosphatemia  01/07/2019   Vertigo 09/23/2018   Dysfunction of left eustachian tube 09/23/2018   Gait disorder 04/21/2018   Leukocytosis 04/15/2018   Nausea & vomiting 04/15/2018   Seizure ([place])    Prolapsed cervical intervertebral disc 03/11/2018   Cervical radiculopathy 02/18/2018   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   Syncope 09/04/2017   Constipation 09/04/2017   Nonintractable headache 09/04/2017   Leg swelling 07/26/2017   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   Hyperlipidemia 04/20/2017   Pedal edema 04/19/2017   Bipolar I disorder ([place])  09/19/2016   Facial pain 07/12/2016   Rash 06/14/2016   Migraine 01/25/2016   Idiopathic urticaria/pruritus 01/23/2016   Allergic rhinitis 01/23/2016   Oral candidiasis 01/23/2016   Greater trochanteric bursitis of both hips 09/08/2015   Chest pain 06/22/2015   Itching 06/22/2015   Bilateral knee pain 06/22/2015   Bilateral hip pain 06/22/2015   Anemia, iron deficiency 12/22/2014   Fatigue 12/21/2014   Intertrigo 08/07/2014   Dyspareunia 07/12/2014   Menopausal syndrome (hot flushes) 07/12/2014   History of tobacco abuse 07/12/2014   Dizziness 06/22/2014   Weakness 06/22/2014   Fibromyalgia 05/14/2014   Encounter for well adult exam with abnormal findings 04/22/2014   Recurrent boils 04/22/2014   Recurrent falls 04/22/2014   Peripheral vascular disease ([place])    Depression    Anxiety    GERD (gastroesophageal reflux disease)    Prediabetes 02/23/2014   Screening mammogram for high-risk patient 02/23/2014   Back pain 07/22/2013   COPD/asthma 08/24/2009   Headache(784.0) 08/24/2009    Past Surgical History:  Procedure Laterality Date   ABDOMINAL HYSTERECTOMY     ANTERIOR CERVICAL DECOMP/DISCECTOMY FUSION N/A 07/28/2020   Procedure: ANTERIOR CERVICAL DECOMPRESSION/DISCECTOMY FUSION. INTERBODY PROTHESIS, PLATE/SCREWS CERVICAL THREE- CERVICAL FOUR, CERVICAL FOUR- CERVICAL FIVE;  Surgeon: Newman Pies, MD;  Location: [facility];  Service: Neurosurgery;  Laterality: N/A;   BACK SURGERY     COLONOSCOPY  12/20/2011   Procedure: COLONOSCOPY;  Surgeon: Landry Dyke, MD;  Location: [facility];  Service: Endoscopy;  Laterality: N/A;   COLONOSCOPY  03/05/2012   Procedure: COLONOSCOPY;  Surgeon: Landry Dyke, MD;  Location: [facility];  Service: Endoscopy;  Laterality: N/A;   DIAGNOSTIC LAPAROSCOPY     HEMORRHOID SURGERY     INCISE AND DRAIN ABCESS     KIDNEY STONE SURGERY     NECK SURGERY     x 2 Dr Orinda Kenner   SPINE SURGERY  2019   TOE SURGERY     TUBAL LIGATION        OB History     Gravida  87   Para  3   Term  3   Preterm      AB  4   Living  3      SAB  3   IAB  1   Ectopic      Multiple      Live Births              Family History  Problem Relation Age of Onset   Diabetes Mother    COPD Mother    Heart disease Mother    Asthma Mother    Diabetes Father    Kidney disease Father    Anesthesia problems Father    Alcohol abuse Father    Colon cancer Father 35   Diabetes Sister    Hypertension Sister    Heart disease Sister    Diabetes Sister    Brain cancer Sister  Hypertension Sister    Asthma Sister    Diabetes Sister    COPD Sister    Hypertension Sister    Anemia Sister    Hypertension Sister    Diabetes Sister    Anemia Sister    Hypertension Sister    Diabetes Brother    Sleep apnea Brother    Asthma Brother    Alcohol abuse Brother    Heart disease Brother    Heart disease Brother    Hypertension Brother    Hypertension Daughter    Anemia Daughter    Allergic rhinitis Neg Hx    Eczema Neg Hx    Urticaria Neg Hx    Esophageal cancer Neg Hx    Prostate cancer Neg Hx    Rectal cancer Neg Hx     Social History   Tobacco Use   Smoking status: Former    Packs/day: 0.25    Years: 36.00    Pack years: 9.00    Types: Cigarettes    Quit date: 01/21/2019    Years since quitting: 2.4   Smokeless tobacco: Never   Tobacco comments:    Passive smoker.  Patient states her quit date was 05/09/2018.  Patient educated with resources at today's appointment to continue to support her to stop smoking.  Vaping Use   Vaping Use: Never used  Substance Use Topics   Alcohol use: Not Currently   Drug use: Yes    Types: Marijuana    Comment: occasionally    Home Medications Prior to Admission medications   Medication Sig Start Date End Date Taking? Authorizing Provider  albuterol (PROVENTIL) (2.5 MG/3ML) 0.083% nebulizer solution Take 3 mLs (2.5 mg total) by nebulization every 6 (six) hours as  needed for wheezing or shortness of breath. Reported on 06/04/2016 08/11/20   Lauraine Rinne, NP  albuterol (VENTOLIN HFA) 108 (90 Base) MCG/ACT inhaler INHALE 4 PUFFS INTO THE LUNGS EVERY 4 HOURS AS NEEDED FOR WHEEZING OR SHORTNESS OF BREATH 04/04/21   Ambs, Kathrine Cords, FNP  aspirin EC 81 MG tablet Take 81 mg by mouth daily. Swallow whole.    [provider]  atorvastatin (LIPITOR) 40 MG tablet Take 1 tablet (40 mg total) by mouth daily. 12/07/20   Biagio Borg, MD  Biotin 1000 MCG tablet Take 1,000 mcg by mouth daily.    [provider]  Budeson-Glycopyrrol-Formoterol (BREZTRI AEROSPHERE) 160-9-4.8 MCG/ACT AERO INHALE 2 PUFFS INTO THE LUNGS TWICE DAILY WITH SPACER 05/16/21   Valentina Shaggy, MD  carbamazepine (TEGRETOL) 200 MG tablet Take 2 tablets (400 mg total) by mouth 2 (two) times daily. 06/28/21   Plovsky, Berneta Sages, MD  cetirizine (ZYRTEC) 10 MG tablet Can take one tablet by mouth once daily if needed for runny nose. 04/28/21   Dara Hoyer, FNP  Cholecalciferol (VITAMIN D) 125 MCG (5000 UT) CAPS Take 5,000 Units by mouth daily.    [provider]  cyclobenzaprine (FLEXERIL) 5 MG tablet TAKE 1 TABLET(5 MG) BY MOUTH THREE TIMES DAILY AS NEEDED FOR MUSCLE SPASMS 04/28/21   Biagio Borg, MD  dextromethorphan-guaiFENesin [facility] DM) 30-600 MG 12hr tablet Take 1 tablet by mouth 2 (two) times daily.    [provider]  diclofenac Sodium (VOLTAREN) 1 % GEL Apply 4 g topically 4 (four) times daily. Patient taking differently: Apply 4 g topically 4 (four) times daily as needed (pain). 06/04/20   Deno Etienne, DO  docusate sodium (COLACE) 100 MG capsule Take 1 capsule (100 mg  total) by mouth 2 (two) times daily. 07/29/20   Newman Pies, MD  estradiol (ESTRACE) 0.5 MG tablet TAKE 1 TABLET BY MOUTH DAILY 07/26/20   Princess Bruins, MD  famotidine (PEPCID) 20 MG tablet TAKE 1 TABLET(20 MG) BY MOUTH TWICE DAILY 01/20/21   Valentina Shaggy, MD  fluticasone [facility]) 50  MCG/ACT nasal spray SHAKE LIQUID AND USE 2 SPRAYS IN [facility] NOSTRIL DAILY 10/27/20   Martyn Ehrich, NP  guaiFENesin-codeine 100-10 MG/5ML syrup Take 10 mLs by mouth 3 (three) times daily as needed for cough. 05/19/21   Valentina Shaggy, MD  hydrochlorothiazide (MICROZIDE) 12.5 MG capsule TAKE 1 CAPSULE(12.5 MG) BY MOUTH DAILY 11/18/20   Biagio Borg, MD  hydrOXYzine (ATARAX/VISTARIL) 50 MG tablet Take 1 tablet (50 mg total) by mouth at bedtime. 02/03/21   Plovsky, Berneta Sages, MD  hydrOXYzine (VISTARIL) 25 MG capsule Take 1 capsule (25 mg total) by mouth every 8 (eight) hours as needed. 06/28/21   Plovsky, Berneta Sages, MD  ipratropium-albuterol (DUONEB) 0.5-2.5 (3) MG/3ML SOLN Take 3 mLs by nebulization every 6 (six) hours as needed. 08/21/20   Tasia Catchings, Amy V, PA-C  irbesartan (AVAPRO) 75 MG tablet Take 1 tablet (75 mg total) by mouth daily. 12/07/20   Biagio Borg, MD  levofloxacin (LEVAQUIN) 500 MG tablet Take 1 tablet (500 mg total) by mouth daily for 10 days. 07/05/21 07/15/21  Biagio Borg, MD  montelukast (SINGULAIR) 10 MG tablet TAKE 1 TABLET(10 MG) BY MOUTH AT BEDTIME 02/14/21   Byrum, Rose Fillers, MD  nystatin (MYCOSTATIN) 100000 UNIT/ML suspension SHAKE WELL AND GIVE 5 ML BY MOUTH EVERY 6 HOURS FOR 7 DAYS 03/10/21   Ambs, Kathrine Cords, FNP  Olopatadine HCl (PATADAY) 0.2 % SOLN Place 1 drop into both eyes 1 day or 1 dose. 05/18/21   Valentina Shaggy, MD  ondansetron (ZOFRAN ODT) 4 MG disintegrating tablet Take 1 tablet (4 mg total) by mouth every 8 (eight) hours as needed for nausea or vomiting. 05/19/21   Valentina Shaggy, MD  oxyCODONE-acetaminophen (PERCOCET/ROXICET) 5-325 MG tablet Take 1-2 tablets by mouth every 4 (four) hours as needed for moderate pain. 07/29/20   Newman Pies, MD  pantoprazole (PROTONIX) 40 MG tablet TAKE 1 TABLET BY MOUTH EVERY DAY 05/24/21   Noralyn Pick, NP  potassium chloride (KLOR-CON) 10 MEQ tablet Take 1 tablet (10 mEq total) by mouth daily. 04/13/21   Biagio Borg,  MD  pravastatin (PRAVACHOL) 40 MG tablet Take 40 mg by mouth daily as needed. 01/16/21   [provider]  TEZSPIRE 210 MG/1.91ML SOSY Inject into the skin. 05/03/21   [provider]  triamcinolone cream (KENALOG) 0.1 % APPLY TOPICALLY TO THE AFFECTED AREA TWICE DAILY 05/09/21   Biagio Borg, MD  UNKNOWN TO PATIENT Weekly allergy shots    [provider]  vitamin C (ASCORBIC ACID) 500 MG tablet Take 500 mg by mouth daily.     [provider]    Allergies    Sulfa antibiotics, Sulfonamide derivatives, and Venofer [iron sucrose]  Review of Systems   Review of Systems  Respiratory:  Positive for shortness of breath.   All other systems reviewed and are negative.  Physical Exam Updated Vital Signs BP 106/72 (BP Location: Right Arm)   Pulse 90   Temp 98.3 F (36.8 C) (Oral)   Ht 5\' 3"  (1.6 m)   Wt 66 kg   SpO2 96%   BMI 25.77 kg/m   Physical Exam Vitals and  nursing note reviewed.  Constitutional:      General: She is not in acute distress.    Appearance: Normal appearance. She is well-developed.  HENT:     Head: Normocephalic and atraumatic.  Eyes:     Conjunctiva/sclera: Conjunctivae normal.     Pupils: Pupils are equal, round, and reactive to light.  Cardiovascular:     Rate and Rhythm: Normal rate and regular rhythm.     Heart sounds: Normal heart sounds.  Pulmonary:     Effort: Pulmonary effort is normal. No respiratory distress.     Comments: Trace expiratory wheezes bilaterally Abdominal:     General: There is no distension.     Palpations: Abdomen is soft.     Tenderness: There is no abdominal tenderness.  Musculoskeletal:        General: No deformity. Normal range of motion.     Cervical back: Normal range of motion and neck supple.  Skin:    General: Skin is warm and dry.  Neurological:     General: No focal deficit present.     Mental Status: She is alert and oriented to person, place, and time.    ED Results /  Procedures / Treatments   Labs (all labs ordered are listed, but only abnormal results are displayed) Labs Reviewed - No data to display  EKG None  Radiology No results found.  Procedures Procedures   Medications Ordered in ED Medications  predniSONE (DELTASONE) tablet 60 mg (has no administration in time range)    ED Course  I have reviewed the triage vital signs and the nursing notes.  Pertinent labs & imaging results that were available during my care of the patient were reviewed by me and considered in my medical decision making (see chart for details).    MDM Rules/Calculators/A&P                           MDM  MSE complete  URIAH TRUEBA was evaluated in Emergency Department on 07/06/2021 for the symptoms described in the history of present illness. She was evaluated in the context of the global COVID-19 pandemic, which necessitated consideration that the patient might be at risk for infection with the SARS-CoV-2 virus that causes COVID-19. Institutional protocols and algorithms that pertain to the evaluation of patients at risk for COVID-19 are in a state of rapid change based on information released by regulatory bodies including the CDC and federal and state organizations. These policies and algorithms were followed during the patient's care in the ED.  Patient presented with complaint of wheezing and mild cough.  She was seen by her PCP yesterday.  Her test for COVID yesterday was negative.  She was started on Levaquin yesterday.  She is already taken 1 dose.  After EMS administered albuterol nebulizer treatment she feels improved.  Chest x-ray obtained today is suggestive of possible early pneumonia.  Patient is advised to continue and complete course of Levaquin.  She is also advised to use prednisone for next several days for control of bronchospasm.  Importance of close follow-up is stressed.  Strict return precautions given understood.   Final Clinical  Impression(s) / ED Diagnoses Final diagnoses:  Cough  Wheezing    Rx / DC Orders ED Discharge Orders          Ordered    predniSONE (DELTASONE) 10 MG tablet  Daily        07/06/21 1513  Valarie Merino, MD 07/06/21 [phone]

## 2021-07-06 NOTE — Discharge Instructions (Addendum)
Return for any problem.   Continue Levaquin as previously prescribed.   Take prednisone as prescribed.  Your Covid test result will be available through MyChart.

## 2021-07-07 LAB — SARS CORONAVIRUS 2 (TAT 6-24 HRS): SARS Coronavirus 2: NEGATIVE

## 2021-07-09 ENCOUNTER — Encounter: Payer: Self-pay | Admitting: Internal Medicine

## 2021-07-09 NOTE — Assessment & Plan Note (Signed)
Lab Results  Component Value Date   HGBA1C 6.2 04/11/2021   Stable, pt to continue current medical treatment  - diet

## 2021-07-09 NOTE — Assessment & Plan Note (Signed)
BP Readings from Last 3 Encounters:  07/06/21 92/64  07/05/21 110/80  07/05/21 108/76   Stable, pt to continue medical treatment hct, avapro,

## 2021-07-09 NOTE — Assessment & Plan Note (Signed)
Pt to continue low chol diet, exercise and pravachol 40

## 2021-07-09 NOTE — Assessment & Plan Note (Signed)
Mild to mod, for antibx course,  to f/u any worsening symptoms or concerns 

## 2021-07-09 NOTE — Assessment & Plan Note (Signed)
Stable overall, cont current med tx  - albuterol hfa prn

## 2021-07-11 ENCOUNTER — Ambulatory Visit (INDEPENDENT_AMBULATORY_CARE_PROVIDER_SITE_OTHER): Payer: Medicare HMO | Admitting: *Deleted

## 2021-07-11 ENCOUNTER — Other Ambulatory Visit: Payer: Self-pay

## 2021-07-11 DIAGNOSIS — J4550 Severe persistent asthma, uncomplicated: Secondary | ICD-10-CM

## 2021-07-11 DIAGNOSIS — J4540 Moderate persistent asthma, uncomplicated: Secondary | ICD-10-CM

## 2021-07-12 ENCOUNTER — Encounter: Payer: Self-pay | Admitting: Internal Medicine

## 2021-07-12 ENCOUNTER — Other Ambulatory Visit: Payer: Self-pay | Admitting: Allergy & Immunology

## 2021-07-15 LAB — ANA,IFA RA DIAG PNL W/RFLX TIT/PATN
ANA Titer 1: NEGATIVE
Cyclic Citrullin Peptide Ab: 8 units (ref 0–19)
Rheumatoid fact SerPl-aCnc: <10 IU/mL (ref <14.0)

## 2021-07-15 LAB — CK: Total CK: 106 U/L (ref 32–182)

## 2021-07-15 LAB — ALDOLASE: Aldolase: 8.3 U/L (ref 3.3–10.3)

## 2021-07-17 ENCOUNTER — Other Ambulatory Visit: Payer: Self-pay

## 2021-07-17 ENCOUNTER — Ambulatory Visit (INDEPENDENT_AMBULATORY_CARE_PROVIDER_SITE_OTHER): Payer: Medicare HMO

## 2021-07-17 ENCOUNTER — Encounter: Payer: Self-pay | Admitting: Internal Medicine

## 2021-07-17 ENCOUNTER — Ambulatory Visit (INDEPENDENT_AMBULATORY_CARE_PROVIDER_SITE_OTHER): Payer: Medicare HMO | Admitting: Internal Medicine

## 2021-07-17 VITALS — BP 104/70 | HR 74 | Temp 99.1°F | Ht 63.0 in | Wt 146.0 lb

## 2021-07-17 DIAGNOSIS — I10 Essential (primary) hypertension: Secondary | ICD-10-CM

## 2021-07-17 DIAGNOSIS — J441 Chronic obstructive pulmonary disease with (acute) exacerbation: Secondary | ICD-10-CM | POA: Diagnosis not present

## 2021-07-17 DIAGNOSIS — I700 Atherosclerosis of aorta: Secondary | ICD-10-CM | POA: Diagnosis not present

## 2021-07-17 DIAGNOSIS — J189 Pneumonia, unspecified organism: Secondary | ICD-10-CM | POA: Diagnosis not present

## 2021-07-17 DIAGNOSIS — R9389 Abnormal findings on diagnostic imaging of other specified body structures: Secondary | ICD-10-CM

## 2021-07-17 DIAGNOSIS — R7303 Prediabetes: Secondary | ICD-10-CM

## 2021-07-17 MED ORDER — HYDROCODONE BIT-HOMATROP MBR 5-1.5 MG/5ML PO SOLN: 5.0000 mL | Freq: Four times a day (QID) | ORAL | 0 refills | Status: AC | PRN

## 2021-07-17 MED ORDER — LEVOFLOXACIN 500 MG PO TABS: 500.0000 mg | ORAL_TABLET | Freq: Every day | ORAL | 0 refills | Status: AC

## 2021-07-17 MED ORDER — METHYLPREDNISOLONE ACETATE 80 MG/ML IJ SUSP
80.0000 mg | Freq: Once | INTRAMUSCULAR | Status: AC
  Administered 2021-07-17: 80 mg via INTRAMUSCULAR

## 2021-07-17 MED ORDER — PREDNISONE 10 MG PO TABS: ORAL_TABLET | ORAL | 0 refills | Status: DC

## 2021-07-17 NOTE — Assessment & Plan Note (Signed)
Mild recurrent, for depomedrol im 80, predpac asd, cough med prn

## 2021-07-17 NOTE — Assessment & Plan Note (Signed)
Lab Results  Component Value Date   HGBA1C 6.2 04/11/2021   Stable, pt to continue current medical treatment  - diet

## 2021-07-17 NOTE — Progress Notes (Signed)
Patient ID: [name], female   DOB: [birth year], 60 y.o.   MRN: [id number]        Chief Complaint: follow up wheezing/sob, recent abnormal cxr, htn, preDM       HPI:  [name] is a 60 y.o. female here with c/o 3 days onset worsening scant prod cough, feeling warm feverish, wheezing/sob but Pt denies chest pain, orthopnea, PND, increased LE swelling, palpitations, dizziness or syncope.  This is after recent ED visit with prednisone for wheezing; CXR showed ? Early RLL infiltrate.   Pt denies polydipsia, polyuria, or new focal neuro s/s.   Pt denies recent wt loss, night sweats, loss of appetite, or other constitutional symptoms       Wt Readings from Last 3 Encounters:  07/17/21 146 lb (66.2 kg)  07/06/21 145 lb 8.1 oz (66 kg)  07/05/21 145 lb 6.4 oz (66 kg)   BP Readings from Last 3 Encounters:  07/17/21 104/70  07/06/21 92/64  07/05/21 110/80         Past Medical History:  Diagnosis Date   ALLERGIC RHINITIS 05/12/2009   Qualifier: Diagnosis of  By: Lenn Cal Deborra Medina), [name]     Allergy    Anemia, iron deficiency 12/22/2014   pt. denies   Anginal pain ([place])    Anxiety    Arthritis    Asthma    ASTHMA 05/12/2009   Severe AFL (Spirometry 05/2009: pre-BD FEV1 0.87L 34% pred, post-BD FEV1 1.11L 44% pred) Volumes hyperinflated Decreased DLCO that does not fully correct to normal range for alveolar volume.      Bipolar disorder ([place])    COPD 08/24/2009   Qualifier: Diagnosis of  By: [name]     COPD (chronic obstructive pulmonary disease) ([place])    Depression    Dyspnea    sometimes when exerting self   Fibromyalgia 05/14/2014   GERD (gastroesophageal reflux disease)    Headache    History of kidney stones    Hyperlipidemia 04/20/2017   HYPERTENSION 05/12/2009   Qualifier: Diagnosis of  By: Lenn Cal Deborra Medina), [name]     Kidney stones    Peripheral vascular disease ([place])    Pneumonia    Prediabetes 02/23/2014   pt. denies   Seizure ([place])     Seizure ([place])    Stroke ([place]) 11/2020   Urticaria    Past Surgical History:  Procedure Laterality Date   ABDOMINAL HYSTERECTOMY     ANTERIOR CERVICAL DECOMP/DISCECTOMY FUSION N/A 07/28/2020   Procedure: ANTERIOR CERVICAL DECOMPRESSION/DISCECTOMY FUSION. INTERBODY PROTHESIS, PLATE/SCREWS CERVICAL THREE- CERVICAL FOUR, CERVICAL FOUR- CERVICAL FIVE;  Surgeon: Newman Pies, MD;  Location: [facility];  Service: Neurosurgery;  Laterality: N/A;   BACK SURGERY     COLONOSCOPY  12/20/2011   Procedure: COLONOSCOPY;  Surgeon: Landry Dyke, MD;  Location: [facility];  Service: Endoscopy;  Laterality: N/A;   COLONOSCOPY  03/05/2012   Procedure: COLONOSCOPY;  Surgeon: Landry Dyke, MD;  Location: [facility];  Service: Endoscopy;  Laterality: N/A;   DIAGNOSTIC LAPAROSCOPY     HEMORRHOID SURGERY     INCISE AND DRAIN ABCESS     KIDNEY STONE SURGERY     NECK SURGERY     x 2 Dr [name]   SPINE SURGERY  2019   TOE SURGERY     TUBAL LIGATION      reports that she quit smoking about 2 years ago. Her smoking use included cigarettes. She has a 9.00 pack-year smoking history. She has never  used smokeless tobacco. She reports previous alcohol use. She reports current drug use. Drug: Marijuana. family history includes Alcohol abuse in her brother and father; Anemia in her daughter, sister, and sister; Anesthesia problems in her father; Asthma in her brother, mother, and sister; Brain cancer in her sister; COPD in her mother and sister; Colon cancer (age of onset: 29) in her father; Diabetes in her brother, father, mother, sister, sister, sister, and sister; Heart disease in her brother, brother, mother, and sister; Hypertension in her brother, daughter, sister, sister, sister, sister, and sister; Kidney disease in her father; Sleep apnea in her brother. Allergies  Allergen Reactions   Sulfa Antibiotics Nausea And Vomiting   Sulfonamide Derivatives Nausea And Vomiting    REACTION: n/v   Venofer [Iron  Sucrose] Hives   Current Outpatient Medications on File Prior to Visit  Medication Sig Dispense Refill   albuterol (PROVENTIL) (2.5 MG/3ML) 0.083% nebulizer solution Take 3 mLs (2.5 mg total) by nebulization every 6 (six) hours as needed for wheezing or shortness of breath. Reported on 06/04/2016 120 mL 11   albuterol (VENTOLIN HFA) 108 (90 Base) MCG/ACT inhaler INHALE 4 PUFFS INTO THE LUNGS EVERY 4 HOURS AS NEEDED FOR WHEEZING OR SHORTNESS OF BREATH 18 g 2   aspirin EC 81 MG tablet Take 81 mg by mouth daily. Swallow whole.     atorvastatin (LIPITOR) 40 MG tablet Take 1 tablet (40 mg total) by mouth daily. 90 tablet 3   Biotin 1000 MCG tablet Take 1,000 mcg by mouth daily.     Budeson-Glycopyrrol-Formoterol (BREZTRI AEROSPHERE) 160-9-4.8 MCG/ACT AERO INHALE 2 PUFFS INTO THE LUNGS TWICE DAILY WITH SPACER 10.7 g 1   carbamazepine (TEGRETOL) 200 MG tablet Take 2 tablets (400 mg total) by mouth 2 (two) times daily. 120 tablet 12   cetirizine (ZYRTEC) 10 MG tablet Can take one tablet by mouth once daily if needed for runny nose. 30 tablet 5   Cholecalciferol (VITAMIN D) 125 MCG (5000 UT) CAPS Take 5,000 Units by mouth daily.     cyclobenzaprine (FLEXERIL) 5 MG tablet TAKE 1 TABLET(5 MG) BY MOUTH THREE TIMES DAILY AS NEEDED FOR MUSCLE SPASMS 60 tablet 2   dextromethorphan-guaiFENesin (MUCINEX DM) 30-600 MG 12hr tablet Take 1 tablet by mouth 2 (two) times daily.     diclofenac Sodium (VOLTAREN) 1 % GEL Apply 4 g topically 4 (four) times daily. (Patient taking differently: Apply 4 g topically 4 (four) times daily as needed (pain).) 100 g 0   docusate sodium (COLACE) 100 MG capsule Take 1 capsule (100 mg total) by mouth 2 (two) times daily. 60 capsule 0   estradiol (ESTRACE) 0.5 MG tablet TAKE 1 TABLET BY MOUTH DAILY 90 tablet 4   famotidine (PEPCID) 20 MG tablet TAKE 1 TABLET(20 MG) BY MOUTH TWICE DAILY 64 tablet 5   fluticasone (FLONASE) 50 MCG/ACT nasal spray SHAKE LIQUID AND USE 2 SPRAYS IN EACH NOSTRIL  DAILY 16 g 5   guaiFENesin-codeine 100-10 MG/5ML syrup Take 10 mLs by mouth 3 (three) times daily as needed for cough. 120 mL 0   hydrochlorothiazide (MICROZIDE) 12.5 MG capsule TAKE 1 CAPSULE(12.5 MG) BY MOUTH DAILY 90 capsule 3   hydrOXYzine (ATARAX/VISTARIL) 50 MG tablet Take 1 tablet (50 mg total) by mouth at bedtime. 90 tablet 1   hydrOXYzine (VISTARIL) 25 MG capsule Take 1 capsule (25 mg total) by mouth every 8 (eight) hours as needed. 30 capsule 6   ipratropium-albuterol (DUONEB) 0.5-2.5 (3) MG/3ML SOLN Take 3 mLs by  nebulization every 6 (six) hours as needed. 60 mL 0   irbesartan (AVAPRO) 75 MG tablet Take 1 tablet (75 mg total) by mouth daily. 90 tablet 3   montelukast (SINGULAIR) 10 MG tablet TAKE 1 TABLET(10 MG) BY MOUTH AT BEDTIME 30 tablet 2   nystatin (MYCOSTATIN) 100000 UNIT/ML suspension SHAKE WELL AND GIVE 5 ML BY MOUTH EVERY 6 HOURS FOR 7 DAYS 60 mL 0   Olopatadine HCl (PATADAY) 0.2 % SOLN Place 1 drop into both eyes 1 day or 1 dose. 2.5 mL 3   ondansetron (ZOFRAN ODT) 4 MG disintegrating tablet Take 1 tablet (4 mg total) by mouth every 8 (eight) hours as needed for nausea or vomiting. 20 tablet 0   oxyCODONE-acetaminophen (PERCOCET/ROXICET) 5-325 MG tablet Take 1-2 tablets by mouth every 4 (four) hours as needed for moderate pain. 30 tablet 0   pantoprazole (PROTONIX) 40 MG tablet TAKE 1 TABLET BY MOUTH EVERY DAY 90 tablet 1   potassium chloride (KLOR-CON) 10 MEQ tablet Take 1 tablet (10 mEq total) by mouth daily. 90 tablet 3   pravastatin (PRAVACHOL) 40 MG tablet Take 40 mg by mouth daily as needed.     TEZSPIRE 210 MG/1.91ML SOSY Inject into the skin.     triamcinolone cream (KENALOG) 0.1 % APPLY TOPICALLY TO THE AFFECTED AREA TWICE DAILY 30 g 1   UNKNOWN TO PATIENT Weekly allergy shots     vitamin C (ASCORBIC ACID) 500 MG tablet Take 500 mg by mouth daily.      Current Facility-Administered Medications on File Prior to Visit  Medication Dose Route Frequency Provider Last  Rate Last Admin   tezepelumab-ekko (TEZSPIRE) 210 MG/1.91ML syringe 210 mg  210 mg Subcutaneous Q28 days Valentina Shaggy, MD   210 mg at 07/11/21 1130        ROS:  All others reviewed and negative.  Objective        PE:  BP 104/70 (BP Location: Left Arm, Patient Position: Sitting, Cuff Size: Normal)   Pulse 74   Temp 99.1 F (37.3 C) (Oral)   Ht '5\' 3"'$  (1.6 m)   Wt 146 lb (66.2 kg)   SpO2 97%   BMI 25.86 kg/m                 Constitutional: Pt appears in NAD               HENT: Head: NCAT.                Right Ear: External ear normal.                 Left Ear: External ear normal.                Eyes: . Pupils are equal, round, and reactive to light. Conjunctivae and EOM are normal               Nose: without d/c or deformity               Neck: Neck supple. Gross normal ROM               Cardiovascular: Normal rate and regular rhythm.                 Pulmonary/Chest: Effort normal and breath sounds decreased without rales but with few bilater wheezing wheezing.                Abd:  Soft, NT, ND, + BS, no  organomegaly               Neurological: Pt is alert. At baseline orientation, motor grossly intact               Skin: Skin is warm. No rashes, no other new lesions, LE edema - none               Psychiatric: Pt behavior is normal without agitation   Micro: none  Cardiac tracings I have personally interpreted today:  none  Pertinent Radiological findings (summarize): July 06, 2021 IMPRESSION: Question early infiltrate or atelectasis right lung base.     Lab Results  Component Value Date   WBC 9.1 06/15/2021   HGB 12.1 06/15/2021   HCT 36.9 06/15/2021   PLT 359.0 06/15/2021   GLUCOSE 75 06/15/2021   CHOL 142 04/11/2021   TRIG 47.0 04/11/2021   HDL 64.30 04/11/2021   LDLCALC 69 04/11/2021   ALT 18 06/15/2021   AST 18 06/15/2021   NA 139 06/15/2021   K 4.2 06/15/2021   CL 101 06/15/2021   CREATININE 0.66 06/15/2021   BUN 14 06/15/2021   CO2 30 06/15/2021    TSH 0.47 04/11/2021   HGBA1C 6.2 04/11/2021   MICROALBUR 0.9 04/11/2021   Assessment/Plan:  KYISHA DENHERDER is a 60 y.o. Black or African American [2] female with  has a past medical history of ALLERGIC RHINITIS (05/12/2009), Allergy, Anemia, iron deficiency (12/22/2014), Anginal pain ([place]), Anxiety, Arthritis, Asthma, ASTHMA (05/12/2009), Bipolar disorder ([place]), COPD (08/24/2009), COPD (chronic obstructive pulmonary disease) ([place]), Depression, Dyspnea, Fibromyalgia (05/14/2014), GERD (gastroesophageal reflux disease), Headache, History of kidney stones, Hyperlipidemia (04/20/2017), HYPERTENSION (05/12/2009), Kidney stones, Peripheral vascular disease ([place]), Pneumonia, Prediabetes (02/23/2014), Seizure ([place]), Seizure ([place]), Stroke ([place]) (11/2020), and Urticaria.  Prediabetes Lab Results  Component Value Date   HGBA1C 6.2 04/11/2021   Stable, pt to continue current medical treatment  - diet   Essential (primary) hypertension BP Readings from Last 3 Encounters:  07/17/21 104/70  07/06/21 92/64  07/05/21 110/80   Stable, pt to continue medical treatment hct, avapro   COPD exacerbation (HCC) Mild recurrent, for depomedrol im 80, predpac asd, cough med prn  Abnormal CXR Also for f/u cxr r/o worsening RLL infiltrate  Followup: No follow-ups on file.  Cathlean Cower, MD 07/17/2021 9:40 PM [facility]

## 2021-07-17 NOTE — Assessment & Plan Note (Signed)
BP Readings from Last 3 Encounters:  07/17/21 104/70  07/06/21 92/64  07/05/21 110/80   Stable, pt to continue medical treatment hct, avapro

## 2021-07-17 NOTE — Assessment & Plan Note (Signed)
Also for f/u cxr r/o worsening RLL infiltrate

## 2021-07-17 NOTE — Patient Instructions (Addendum)
You had the steroid shot today  Please take all new medication as prescribed - the antibiotic, cough medicine, and prednisone  Please continue all other medications as before, and refills have been done if requested.  Please have the pharmacy call with any other refills you may need.  Please continue your efforts at being more active, low cholesterol diet, and weight control.  Please keep your appointments with your specialists as you may have planned  Please go to the XRAY Department in the first floor for the x-ray testing  You will be contacted by phone if any changes need to be made immediately.  Otherwise, you will receive a letter about your results with an explanation, but please check with MyChart first.  Please remember to sign up for MyChart if you have not done so, as this will be important to you in the future with finding out test results, communicating by private email, and scheduling acute appointments online when needed.  Please make an Appointment to return in 6 months, or sooner if needed

## 2021-07-18 ENCOUNTER — Other Ambulatory Visit: Payer: Self-pay

## 2021-07-18 ENCOUNTER — Encounter: Payer: Self-pay | Admitting: Internal Medicine

## 2021-07-18 ENCOUNTER — Ambulatory Visit
Admission: RE | Admit: 2021-07-18 | Discharge: 2021-07-18 | Disposition: A | Discharge status: Home / Self Care | Payer: Medicare HMO | Source: Ambulatory Visit | Attending: Diagnostic Neuroimaging | Admitting: Diagnostic Neuroimaging

## 2021-07-18 DIAGNOSIS — M545 Low back pain, unspecified: Secondary | ICD-10-CM | POA: Diagnosis not present

## 2021-07-18 DIAGNOSIS — M48062 Spinal stenosis, lumbar region with neurogenic claudication: Secondary | ICD-10-CM

## 2021-07-18 MED ORDER — PREDNISONE 10 MG PO TABS: ORAL_TABLET | ORAL | 0 refills | Status: DC

## 2021-07-20 ENCOUNTER — Ambulatory Visit (INDEPENDENT_AMBULATORY_CARE_PROVIDER_SITE_OTHER): Payer: Medicare HMO

## 2021-07-20 DIAGNOSIS — J309 Allergic rhinitis, unspecified: Secondary | ICD-10-CM | POA: Diagnosis not present

## 2021-07-24 ENCOUNTER — Telehealth: Payer: Self-pay

## 2021-07-24 NOTE — Telephone Encounter (Signed)
Felicia Edwards, RN  [place], [place], RN Hold PPI prior to urea breath test which is due on/after 08/07/21.

## 2021-07-24 NOTE — Telephone Encounter (Signed)
-----   Message from Yevette Edwards, RN sent at 06/26/2021  8:39 AM EDT ----- Regarding: Labs Repeat CBC, TIBC + Ferritin. Need to place orders.

## 2021-07-24 NOTE — Telephone Encounter (Signed)
Spoke with patient to remind her that she is due for repeat labs at this time. No appointment is necessary. Patient is aware that she can stop by the lab in the basement at her convenience between 7:30 AM - 5 PM, Monday through Friday. Advised patient to also begin holding her Pantoprazole for 2 weeks. Advised patient that I will give her a call when she is due for H. Pylori breath test. Patient verbalized understanding and had no concerns at the end of the call.

## 2021-07-25 ENCOUNTER — Ambulatory Visit (INDEPENDENT_AMBULATORY_CARE_PROVIDER_SITE_OTHER): Payer: Medicare HMO | Admitting: *Deleted

## 2021-07-25 ENCOUNTER — Other Ambulatory Visit (INDEPENDENT_AMBULATORY_CARE_PROVIDER_SITE_OTHER): Payer: Medicare HMO

## 2021-07-25 DIAGNOSIS — D509 Iron deficiency anemia, unspecified: Secondary | ICD-10-CM | POA: Diagnosis not present

## 2021-07-25 DIAGNOSIS — J309 Allergic rhinitis, unspecified: Secondary | ICD-10-CM

## 2021-07-25 DIAGNOSIS — D72829 Elevated white blood cell count, unspecified: Secondary | ICD-10-CM

## 2021-07-25 LAB — CBC WITH DIFFERENTIAL/PLATELET
Basophils Absolute: 0 10*3/uL (ref 0.0–0.1)
Basophils Relative: 0.2 % (ref 0.0–3.0)
Eosinophils Absolute: 0 10*3/uL (ref 0.0–0.7)
Eosinophils Relative: 0.2 % (ref 0.0–5.0)
HCT: 37.2 % (ref 36.0–46.0)
Hemoglobin: 12.3 g/dL (ref 12.0–15.0)
Lymphocytes Relative: 21.1 % (ref 12.0–46.0)
Lymphs Abs: 3.9 10*3/uL (ref 0.7–4.0)
MCHC: 33.1 g/dL (ref 30.0–36.0)
MCV: 91 fl (ref 78.0–100.0)
Monocytes Absolute: 1.6 10*3/uL — ABNORMAL HIGH (ref 0.1–1.0)
Monocytes Relative: 8.6 % (ref 3.0–12.0)
Neutro Abs: 13 10*3/uL — ABNORMAL HIGH (ref 1.4–7.7)
Neutrophils Relative %: 69.9 % (ref 43.0–77.0)
Platelets: 385 10*3/uL (ref 150.0–400.0)
RBC: 4.09 Mil/uL (ref 3.87–5.11)
RDW: 22.9 % — ABNORMAL HIGH (ref 11.5–15.5)
WBC: 18.7 10*3/uL (ref 4.0–10.5)

## 2021-07-25 LAB — IRON,TIBC AND FERRITIN PANEL
%SAT: 21 % (calc) (ref 16–45)
Ferritin: 32 ng/mL (ref 16–232)
Iron: 62 ug/dL (ref 45–160)
TIBC: 289 mcg/dL (calc) (ref 250–450)

## 2021-07-25 NOTE — Progress Notes (Signed)
Ok to contact pt please  Chart review indicates elevated WBC on many occasions since at least 2019, of which the cause is not apparent  We should refer to Hematology for further consideration to if anything else needs to be done  I will do this, and she should hear from the office hopefuly soon.

## 2021-07-26 ENCOUNTER — Encounter: Payer: Self-pay | Admitting: *Deleted

## 2021-07-26 NOTE — Progress Notes (Signed)
Already adressed

## 2021-07-27 DIAGNOSIS — J3089 Other allergic rhinitis: Secondary | ICD-10-CM | POA: Diagnosis not present

## 2021-07-27 NOTE — Progress Notes (Signed)
VIAL MADE. EXP 07-27-22

## 2021-08-07 MED ORDER — BREZTRI AEROSPHERE 160-9-4.8 MCG/ACT IN AERO: INHALATION_SPRAY | RESPIRATORY_TRACT | 5 refills | Status: DC

## 2021-08-07 NOTE — Telephone Encounter (Signed)
Called and informed patient that a sample of the [name] will be up front in the [place] office as well as a coupon. Patient verbalized understanding and also made an appointment with Dr. Ernst Bowler for a follow up.

## 2021-08-07 NOTE — Telephone Encounter (Signed)
Patient is requesting a sample of [name], patient states her insurance will not cover it. Patient is coming in tomorrow for an injection and can pick it up then. Best contact number: [phone]

## 2021-08-07 NOTE — Addendum Note (Signed)
Addended by: Clovis Cao A on: 08/07/2021 11:50 AM   Modules accepted: Orders

## 2021-08-08 ENCOUNTER — Ambulatory Visit (INDEPENDENT_AMBULATORY_CARE_PROVIDER_SITE_OTHER): Payer: Medicare HMO | Admitting: *Deleted

## 2021-08-08 ENCOUNTER — Telehealth: Payer: Self-pay

## 2021-08-08 ENCOUNTER — Other Ambulatory Visit: Payer: Self-pay

## 2021-08-08 DIAGNOSIS — J4540 Moderate persistent asthma, uncomplicated: Secondary | ICD-10-CM

## 2021-08-08 NOTE — Telephone Encounter (Signed)
Lm on vm for patient to return call.  Urea breath test orders and address to [facility] have been placed on the 3rd floor for patient to get tomorrow at her appt.

## 2021-08-08 NOTE — Telephone Encounter (Signed)
-----   Message from Yevette Edwards, RN sent at 06/21/2021 12:20 PM EDT ----- Regarding: Urea Breath Test Urea breath test due on or after 8/22

## 2021-08-08 NOTE — Telephone Encounter (Signed)
Patient came in and picked up [facility] sample.

## 2021-08-09 ENCOUNTER — Ambulatory Visit (INDEPENDENT_AMBULATORY_CARE_PROVIDER_SITE_OTHER): Payer: Medicare HMO | Admitting: Gastroenterology

## 2021-08-09 ENCOUNTER — Encounter: Payer: Self-pay | Admitting: Gastroenterology

## 2021-08-09 VITALS — BP 120/80 | HR 84 | Ht 63.0 in | Wt 149.1 lb

## 2021-08-09 DIAGNOSIS — K254 Chronic or unspecified gastric ulcer with hemorrhage: Secondary | ICD-10-CM

## 2021-08-09 DIAGNOSIS — A048 Other specified bacterial intestinal infections: Secondary | ICD-10-CM

## 2021-08-09 DIAGNOSIS — R634 Abnormal weight loss: Secondary | ICD-10-CM | POA: Diagnosis not present

## 2021-08-09 DIAGNOSIS — D500 Iron deficiency anemia secondary to blood loss (chronic): Secondary | ICD-10-CM | POA: Diagnosis not present

## 2021-08-09 NOTE — Patient Instructions (Addendum)
If you are age 58 or older, your body mass index should be between 23-30. Your Body mass index is 26.42 kg/m. If this is out of the aforementioned range listed, please consider follow up with your Primary Care Provider.  If you are age 51 or younger, your body mass index should be between 19-25. Your Body mass index is 26.42 kg/m. If this is out of the aformentioned range listed, please consider follow up with your Primary Care Provider.   __________________________________________________________  The [facility] providers would like to encourage you to use [facility] to communicate with providers for non-urgent requests or questions.  Due to long hold times on the telephone, sending your provider a message by [facility] may be a faster and more efficient way to get a response.  Please allow 48 business hours for a response.  Please remember that this is for non-urgent requests.   Please stop taking pantoprazole.  Please follow the instructions below to prepare for the H.pylori breath test. This will have to be completed at a [place] location.   (one day prior to your test) avoid high gas foods, high fiber goods, fruits and fruit juices, vegetables, beans, cereals, fiber supplements, onion and bell peppers. You may eat hamburger, chicken, beef, tuna fish and eggs.  You should have nothing to eat or drink after midnight.    Do not smoke, chew gum or eat hard candy the morning of your test.  You may not have the test within 2 weeks after taking any antibiotics or within 4 weeks if confirming eradication of H.Pylori.  2 weeks prior to your test), stop any Pepto-Bismol and/or proton pump inhibitors (Prevacid, Zegerid, Nexium, Prilosec, Protonix, Aciphex, Dexilant).  You may use over the counter strength Axid, Pepcid, Tagamet and Zantac 2 weeks prior to your tests. However, you should STOP these 24 hours before the test. You may use Tums and Maalox.  It was a pleasure to see you today!  Thank you for  trusting me with your gastrointestinal care!

## 2021-08-09 NOTE — Progress Notes (Signed)
[facility] Progress Note  Chief Complaint: Iron deficiency anemia and weight loss.  Subjective  History: [name] was seen for initial clinic visit June 8, that extensive note details her history.  Briefly, H. pylori found on EGD with Dr. Paulita Fujita February 2016 history of hyperplastic colon polyp in 2013.  Sent by primary care for IDA and weight loss.  Primary care discovered hemoglobin 8.5, records revealed anemia hemoglobin 1011 dating back to at least 2019.  Patient was describing dysphagia and heartburn, regularly used BC powder.  She had also reported about an 11 pound weight loss in the 2 months prior to the visit here. No source of weight loss or IDA on EGD or colonoscopy 06/09/2021.  EGD with some small white plaques in the esophagus, though biopsies negative for Candida.  Small linear ulcers in the stomach, potentially causing IDA.  Biopsies were positive for H. pylori.  Previous treatment regimen with [facility] was unknown, so I prescribed 14 days of PPI, rifabutin and amoxicillin. CT chest abdomen and pelvis was also ordered to look for other sources of weight loss.  [name] is glad to report she is feeling well lately.  She stopped taking BC powder and says her dysphagia resolved, she has no abdominal pain and she is slowly gaining some weight. She is still taking pantoprazole and confirms completion of the antibiotic course.  ROS: Cardiovascular:  no chest pain Respiratory: no dyspnea Remainder of systems negative except as above The patient's Past Medical, Family and Social History were reviewed and are on file in the EMR.  Objective:  Med list reviewed  Current Outpatient Medications:    albuterol (PROVENTIL) (2.5 MG/3ML) 0.083% nebulizer solution, Take 3 mLs (2.5 mg total) by nebulization every 6 (six) hours as needed for wheezing or shortness of breath. Reported on 06/04/2016, Disp: 120 mL, Rfl: 11   albuterol (VENTOLIN HFA) 108 (90 Base) MCG/ACT inhaler, INHALE 4  PUFFS INTO THE LUNGS EVERY 4 HOURS AS NEEDED FOR WHEEZING OR SHORTNESS OF BREATH, Disp: 18 g, Rfl: 2   aspirin EC 81 MG tablet, Take 81 mg by mouth daily. Swallow whole., Disp: , Rfl:    atorvastatin (LIPITOR) 40 MG tablet, Take 1 tablet (40 mg total) by mouth daily., Disp: 90 tablet, Rfl: 3   Biotin 1000 MCG tablet, Take 1,000 mcg by mouth daily., Disp: , Rfl:    Budeson-Glycopyrrol-Formoterol (BREZTRI AEROSPHERE) 160-9-4.8 MCG/ACT AERO, INHALE 2 PUFFS INTO THE LUNGS TWICE DAILY WITH SPACER, Disp: 10.7 g, Rfl: 5   carbamazepine (TEGRETOL) 200 MG tablet, Take 2 tablets (400 mg total) by mouth 2 (two) times daily., Disp: 120 tablet, Rfl: 12   cetirizine (ZYRTEC) 10 MG tablet, Can take one tablet by mouth once daily if needed for runny nose., Disp: 30 tablet, Rfl: 5   Cholecalciferol (VITAMIN D) 125 MCG (5000 UT) CAPS, Take 5,000 Units by mouth daily., Disp: , Rfl:    cyclobenzaprine (FLEXERIL) 5 MG tablet, TAKE 1 TABLET(5 MG) BY MOUTH THREE TIMES DAILY AS NEEDED FOR MUSCLE SPASMS, Disp: 60 tablet, Rfl: 2   dextromethorphan-guaiFENesin (MUCINEX DM) 30-600 MG 12hr tablet, Take 1 tablet by mouth 2 (two) times daily., Disp: , Rfl:    diclofenac Sodium (VOLTAREN) 1 % GEL, Apply 4 g topically 4 (four) times daily. (Patient taking differently: Apply 4 g topically 4 (four) times daily as needed (pain).), Disp: 100 g, Rfl: 0   docusate sodium (COLACE) 100 MG capsule, Take 1 capsule (100 mg total) by mouth 2 (  two) times daily., Disp: 60 capsule, Rfl: 0   estradiol (ESTRACE) 0.5 MG tablet, TAKE 1 TABLET BY MOUTH DAILY, Disp: 90 tablet, Rfl: 4   famotidine (PEPCID) 20 MG tablet, TAKE 1 TABLET(20 MG) BY MOUTH TWICE DAILY, Disp: 64 tablet, Rfl: 5   fluticasone (FLONASE) 50 MCG/ACT nasal spray, SHAKE LIQUID AND USE 2 SPRAYS IN EACH NOSTRIL DAILY, Disp: 16 g, Rfl: 5   guaiFENesin-codeine 100-10 MG/5ML syrup, Take 10 mLs by mouth 3 (three) times daily as needed for cough., Disp: 120 mL, Rfl: 0   hydrochlorothiazide  (MICROZIDE) 12.5 MG capsule, TAKE 1 CAPSULE(12.5 MG) BY MOUTH DAILY, Disp: 90 capsule, Rfl: 3   hydrOXYzine (ATARAX/VISTARIL) 50 MG tablet, Take 1 tablet (50 mg total) by mouth at bedtime., Disp: 90 tablet, Rfl: 1   hydrOXYzine (VISTARIL) 25 MG capsule, Take 1 capsule (25 mg total) by mouth every 8 (eight) hours as needed., Disp: 30 capsule, Rfl: 6   ipratropium-albuterol (DUONEB) 0.5-2.5 (3) MG/3ML SOLN, Take 3 mLs by nebulization every 6 (six) hours as needed., Disp: 60 mL, Rfl: 0   irbesartan (AVAPRO) 75 MG tablet, Take 1 tablet (75 mg total) by mouth daily., Disp: 90 tablet, Rfl: 3   montelukast (SINGULAIR) 10 MG tablet, TAKE 1 TABLET(10 MG) BY MOUTH AT BEDTIME, Disp: 30 tablet, Rfl: 2   nystatin (MYCOSTATIN) 100000 UNIT/ML suspension, SHAKE WELL AND GIVE 5 ML BY MOUTH EVERY 6 HOURS FOR 7 DAYS, Disp: 60 mL, Rfl: 0   Olopatadine HCl (PATADAY) 0.2 % SOLN, Place 1 drop into both eyes 1 day or 1 dose., Disp: 2.5 mL, Rfl: 3   ondansetron (ZOFRAN ODT) 4 MG disintegrating tablet, Take 1 tablet (4 mg total) by mouth every 8 (eight) hours as needed for nausea or vomiting., Disp: 20 tablet, Rfl: 0   oxyCODONE-acetaminophen (PERCOCET/ROXICET) 5-325 MG tablet, Take 1-2 tablets by mouth every 4 (four) hours as needed for moderate pain., Disp: 30 tablet, Rfl: 0   pantoprazole (PROTONIX) 40 MG tablet, TAKE 1 TABLET BY MOUTH EVERY DAY, Disp: 90 tablet, Rfl: 1   potassium chloride (KLOR-CON) 10 MEQ tablet, Take 1 tablet (10 mEq total) by mouth daily., Disp: 90 tablet, Rfl: 3   pravastatin (PRAVACHOL) 40 MG tablet, Take 40 mg by mouth daily as needed., Disp: , Rfl:    TEZSPIRE 210 MG/1.91ML SOSY, Inject into the skin., Disp: , Rfl:    triamcinolone cream (KENALOG) 0.1 %, APPLY TOPICALLY TO THE AFFECTED AREA TWICE DAILY, Disp: 30 g, Rfl: 1   UNKNOWN TO PATIENT, Weekly allergy shots, Disp: , Rfl:    vitamin C (ASCORBIC ACID) 500 MG tablet, Take 500 mg by mouth daily. , Disp: , Rfl:   Current Facility-Administered  Medications:    tezepelumab-ekko (TEZSPIRE) 210 MG/1.91ML syringe 210 mg, 210 mg, Subcutaneous, Q28 days, Valentina Shaggy, MD, 210 mg at 08/08/21 [id number]   Vital signs in last 24 hrs: Vitals:   08/09/21 1033  BP: 120/80  Pulse: 84   Wt Readings from Last 3 Encounters:  08/09/21 149 lb 2 oz (67.6 kg)  07/17/21 146 lb (66.2 kg)  07/06/21 145 lb 8.1 oz (66 kg)    Physical Exam  Well-appearing HEENT: sclera anicteric, oral mucosa moist without lesions Neck: supple, no thyromegaly, JVD or lymphadenopathy Cardiac: RRR without murmurs, S1S2 heard, no peripheral edema Pulm: clear to auscultation bilaterally, normal RR and effort noted Abdomen: soft, no tenderness, with active bowel sounds. No guarding or palpable hepatosplenomegaly. Skin; warm and dry, no jaundice or rash  Labs:  CBC Latest Ref Rng & Units 07/25/2021 06/15/2021 06/05/2021  WBC 4.0 - 10.5 K/uL 18.7 Repeated and verified X2.(HH) 9.1 12.5(H)  Hemoglobin 12.0 - 15.0 g/dL 12.3 12.1 10.9(L)  Hematocrit 36.0 - 46.0 % 37.2 36.9 33.8(L)  Platelets 150.0 - 400.0 K/uL 385.0 359.0 364.0   I noted this chronic leukocytosis that was worsened, and brought to Dr. Gwynn Burly attention in a result note and he was going to consider hematology referral.  On 07/25/2021, iron 62, TIBC 289, ferritin 32 ___________________________________________ Radiologic studies:  CLINICAL DATA:  Weight loss in a 60 year old female, history of ulcers on endoscopy with lower and mid abdominal pain. Recently.   EXAM: CT CHEST, ABDOMEN, AND PELVIS WITH CONTRAST   TECHNIQUE: Multidetector CT imaging of the chest, abdomen and pelvis was performed following the standard protocol during bolus administration of intravenous contrast.   CONTRAST:  134m OMNIPAQUE IOHEXOL 300 MG/ML  SOLN   COMPARISON:  November of 2012, chest CT.   FINDINGS: CT CHEST FINDINGS   Cardiovascular: Calcified and noncalcified atheromatous plaque of the thoracic aorta. No  aneurysmal dilation. Normal heart size without pericardial effusion. Central pulmonary vasculature is unremarkable.   Mediastinum/Nodes: Patulous esophagus.  No adenopathy in the chest.   Lungs/Pleura: Pulmonary emphysema worse at the lung apices. Centrilobular predominant. Moderate in severity. Airways are patent.   Musculoskeletal: Spinal degenerative changes about the thoracic spine. No acute or destructive bone process. See below for full musculoskeletal details.   CT ABDOMEN PELVIS FINDINGS   Hepatobiliary: No focal, suspicious hepatic lesion. No pericholecystic stranding. No sign of biliary duct dilation.   Pancreas: Normal, without mass, inflammation or ductal dilatation.   Spleen: Spleen normal size and contour without focal lesion.   Adrenals/Urinary Tract: Adrenal glands are normal.   Symmetric renal enhancement.   No hydronephrosis. Urinary bladder with smooth contours. No perinephric stranding.   Stomach/Bowel: No acute gastrointestinal process. Normal appendix. Stomach under distended. Small bowel nondilated   Vascular/Lymphatic: Calcified and noncalcified atheromatous plaque in the abdominal aorta. Smooth contour of the IVC. No aneurysmal dilation of the abdominal aorta. There is no gastrohepatic or hepatoduodenal ligament lymphadenopathy. No retroperitoneal or mesenteric lymphadenopathy.   No pelvic sidewall lymphadenopathy.   Reproductive: Post hysterectomy without adnexal mass.   Other: No ascites.  Small fat containing umbilical hernia.   Musculoskeletal: No acute or significant osseous findings.   IMPRESSION: 1. No acute findings in the chest, abdomen or pelvis. 2. Emphysema and aortic atherosclerosis. 3. Small fat containing umbilical hernia.   Aortic Atherosclerosis (ICD10-I70.0) and Emphysema (ICD10-J43.9).     Electronically Signed   By: GZetta BillsM.D.   On: 06/23/2021 16:06 _____________________________________________  CLINICAL  DATA:  Dysphagia for 1 month. Chronic acid reflux. Abnormal endoscopy 06/09/2021.   EXAM: ESOPHOGRAM / BARIUM SWALLOW / BARIUM TABLET STUDY   TECHNIQUE: Combined double contrast and single contrast examination performed using effervescent crystals, thick barium liquid, and thin barium liquid. The patient was observed with fluoroscopy swallowing a 13 mm barium sulphate tablet.   FLUOROSCOPY TIME:  Fluoroscopy Time:  2 minutes and 42 seconds   Radiation Exposure Index (if provided by the fluoroscopic device): 27.7 mGy   Number of Acquired Spot Images: None   COMPARISON:  06/23/2021 chest abdomen and pelvic CTs. Endoscopy note of 06/09/2021   FINDINGS: Double contrast evaluation of the esophagus demonstrates no mucosal abnormality.   Evaluation of esophageal peristalsis is normal. Incidental note is made of cervical spine fixation.   Full column evaluation of  the esophagus demonstrates no persistent narrowing or stricture. No hiatal hernia.   A 13 mm barium tablet passes promptly.   No gastroesophageal reflux with water swallows.   IMPRESSION: No explanation for patient's symptoms. No hiatal hernia, esophageal dysmotility, or gastroesophageal reflux.     Electronically Signed   By: Abigail Miyamoto M.D.   On: 06/30/2021 12:01  ____________________________________________ Other:   _____________________________________________ Assessment & Plan  Assessment: Encounter Diagnoses  Name Primary?   Iron deficiency anemia due to chronic blood loss Yes   Chronic gastric ulcer with hemorrhage    Weight loss    Marked iron deficiency anemia, now resolved.  Seemingly occult blood loss from small ulcers, though its possible they were larger at some point before being discovered.  Small bowel source of blood loss possible, so if she develops recurrent anemia, capsule study will need to be considered.  I am encouraged that her hemoglobin has normalized and she is feeling much  better.  She was encouraged to stay away from [facility] powders and other aspirin containing medications indefinitely.  Weight has stabilized.  H pylori, persistent despite treatment years ago with unknown antibiotic regimen.  Recently treated with rifabutin and amoxicillin and PPI.  Must confirm eradication as this was contributing to her ulcers.  Plan: Stop  Urea breath test ordered and arranged  Otherwise see me as needed.  31 minutes were spent on this encounter (including chart review, history/exam, counseling/coordination of care, and documentation) > 50% of that time was spent on counseling and coordination of care.  Topics discussed included: See above.  Nelida Meuse III

## 2021-08-10 ENCOUNTER — Other Ambulatory Visit: Payer: Self-pay | Admitting: Allergy & Immunology

## 2021-08-11 ENCOUNTER — Ambulatory Visit: Payer: Medicare HMO | Admitting: Obstetrics & Gynecology

## 2021-08-11 NOTE — Telephone Encounter (Signed)
Pt was notified during office visit.

## 2021-08-15 ENCOUNTER — Encounter: Payer: Self-pay | Admitting: Internal Medicine

## 2021-08-15 ENCOUNTER — Ambulatory Visit (INDEPENDENT_AMBULATORY_CARE_PROVIDER_SITE_OTHER): Payer: Medicare HMO | Admitting: Internal Medicine

## 2021-08-15 ENCOUNTER — Other Ambulatory Visit: Payer: Self-pay

## 2021-08-15 VITALS — BP 114/70 | HR 79 | Temp 98.7°F | Ht 63.0 in | Wt 149.0 lb

## 2021-08-15 DIAGNOSIS — I10 Essential (primary) hypertension: Secondary | ICD-10-CM

## 2021-08-15 DIAGNOSIS — J45901 Unspecified asthma with (acute) exacerbation: Secondary | ICD-10-CM

## 2021-08-15 DIAGNOSIS — J441 Chronic obstructive pulmonary disease with (acute) exacerbation: Secondary | ICD-10-CM

## 2021-08-15 DIAGNOSIS — D72829 Elevated white blood cell count, unspecified: Secondary | ICD-10-CM

## 2021-08-15 DIAGNOSIS — R7303 Prediabetes: Secondary | ICD-10-CM | POA: Diagnosis not present

## 2021-08-15 MED ORDER — METHYLPREDNISOLONE ACETATE 80 MG/ML IJ SUSP
80.0000 mg | Freq: Once | INTRAMUSCULAR | Status: AC
  Administered 2021-08-15: 80 mg via INTRAMUSCULAR

## 2021-08-15 MED ORDER — PREDNISONE 10 MG PO TABS: ORAL_TABLET | ORAL | 0 refills | Status: DC

## 2021-08-15 NOTE — Patient Instructions (Signed)
You had the steroid shot today  Please take all new medication as prescribed - the prednisone  Please continue all other medications as before, and refills have been done if requested.  Please have the pharmacy call with any other refills you may need.  Please continue your efforts at being more active, low cholesterol diet, and weight control.  Please keep your appointments with your specialists as you may have planned - pulmonary  I think we can hold on another xray today  You will be contacted regarding the referral for: Hematology/oncology for the elevated white blood cells

## 2021-08-15 NOTE — Progress Notes (Signed)
Patient ID: [name], female   DOB: April 16, 1961, 60 y.o.   MRN: [id number]        Chief Complaint: follow up asthma flare, elevated wbc, hyperglycemia, htn       HPI:  [name] is a 60 y.o. female here overall ok; Pt denies chest pain, increased sob or doe, wheezing, orthopnea, PND, increased LE swelling, palpitations, dizziness or syncope, except for mild worsening sob doe with non prod cough and wheezing in the past wk again, similar to previous visit, despite good med compliance per pt.   Pt denies polydipsia, polyuria, or new focal neuro s/s.   Pt denies fever, wt loss, night sweats, loss of appetite, or other constitutional symptoms but does have unusual leukocytosis that is not clear is related to steroid .  No other new complaints       Wt Readings from Last 3 Encounters:  08/15/21 149 lb (67.6 kg)  08/09/21 149 lb 2 oz (67.6 kg)  07/17/21 146 lb (66.2 kg)   BP Readings from Last 3 Encounters:  08/15/21 114/70  08/09/21 120/80  07/17/21 104/70         Past Medical History:  Diagnosis Date   ALLERGIC RHINITIS 05/12/2009   Qualifier: Diagnosis of  By: Lenn Cal Deborra Medina), [name]     Allergy    Anemia, iron deficiency 12/22/2014   pt. denies   Anginal pain ([place])    Anxiety    Arthritis    Asthma    ASTHMA 05/12/2009   Severe AFL (Spirometry 05/2009: pre-BD FEV1 0.87L 34% pred, post-BD FEV1 1.11L 44% pred) Volumes hyperinflated Decreased DLCO that does not fully correct to normal range for alveolar volume.      Bipolar disorder ([place])    COPD 08/24/2009   Qualifier: Diagnosis of  By: Burnett Kanaris     COPD (chronic obstructive pulmonary disease) ([place])    Depression    Dyspnea    sometimes when exerting self   Fibromyalgia 05/14/2014   GERD (gastroesophageal reflux disease)    Headache    History of kidney stones    Hyperlipidemia 04/20/2017   HYPERTENSION 05/12/2009   Qualifier: Diagnosis of  By: Lenn Cal Deborra Medina), [name]     Kidney stones     Peripheral vascular disease ([place])    Pneumonia    Prediabetes 02/23/2014   pt. denies   Seizure ([place])    Seizure ([place])    Stroke ([place]) 11/2020   Urticaria    Past Surgical History:  Procedure Laterality Date   ABDOMINAL HYSTERECTOMY     ANTERIOR CERVICAL DECOMP/DISCECTOMY FUSION N/A 07/28/2020   Procedure: ANTERIOR CERVICAL DECOMPRESSION/DISCECTOMY FUSION. INTERBODY PROTHESIS, PLATE/SCREWS CERVICAL THREE- CERVICAL FOUR, CERVICAL FOUR- CERVICAL FIVE;  Surgeon: Newman Pies, MD;  Location: [facility];  Service: Neurosurgery;  Laterality: N/A;   BACK SURGERY     COLONOSCOPY  12/20/2011   Procedure: COLONOSCOPY;  Surgeon: Landry Dyke, MD;  Location: [facility];  Service: Endoscopy;  Laterality: N/A;   COLONOSCOPY  03/05/2012   Procedure: COLONOSCOPY;  Surgeon: Landry Dyke, MD;  Location: [facility];  Service: Endoscopy;  Laterality: N/A;   DIAGNOSTIC LAPAROSCOPY     HEMORRHOID SURGERY     INCISE AND DRAIN ABCESS     KIDNEY STONE SURGERY     NECK SURGERY     x 2 Dr Orinda Kenner   SPINE SURGERY  2019   TOE SURGERY     TUBAL LIGATION      reports that she quit  smoking about 2 years ago. Her smoking use included cigarettes. She has a 9.00 pack-year smoking history. She has never used smokeless tobacco. She reports that she does not currently use alcohol. She reports current drug use. Drug: Marijuana. family history includes Alcohol abuse in her brother and father; Anemia in her daughter, sister, and sister; Anesthesia problems in her father; Asthma in her brother, mother, and sister; Brain cancer in her sister; COPD in her mother and sister; Colon cancer (age of onset: 106) in her father; Diabetes in her brother, father, mother, sister, sister, sister, and sister; Heart disease in her brother, brother, mother, and sister; Hypertension in her brother, daughter, sister, sister, sister, sister, and sister; Kidney disease in her father; Sleep apnea in her brother. Allergies  Allergen  Reactions   Sulfa Antibiotics Nausea And Vomiting   Sulfonamide Derivatives Nausea And Vomiting    REACTION: n/v   Venofer [Iron Sucrose] Hives   Current Outpatient Medications on File Prior to Visit  Medication Sig Dispense Refill   albuterol (PROVENTIL) (2.5 MG/3ML) 0.083% nebulizer solution Take 3 mLs (2.5 mg total) by nebulization every 6 (six) hours as needed for wheezing or shortness of breath. Reported on 06/04/2016 120 mL 11   albuterol (VENTOLIN HFA) 108 (90 Base) MCG/ACT inhaler INHALE 4 PUFFS INTO THE LUNGS EVERY 4 HOURS AS NEEDED FOR WHEEZING OR SHORTNESS OF BREATH 18 g 2   aspirin EC 81 MG tablet Take 81 mg by mouth daily. Swallow whole.     atorvastatin (LIPITOR) 40 MG tablet Take 1 tablet (40 mg total) by mouth daily. 90 tablet 3   Biotin 1000 MCG tablet Take 1,000 mcg by mouth daily.     Budeson-Glycopyrrol-Formoterol (BREZTRI AEROSPHERE) 160-9-4.8 MCG/ACT AERO INHALE 2 PUFFS INTO THE LUNGS TWICE DAILY WITH SPACER 10.7 g 5   carbamazepine (TEGRETOL) 200 MG tablet Take 2 tablets (400 mg total) by mouth 2 (two) times daily. 120 tablet 12   cetirizine (ZYRTEC) 10 MG tablet Can take one tablet by mouth once daily if needed for runny nose. 30 tablet 5   Cholecalciferol (VITAMIN D) 125 MCG (5000 UT) CAPS Take 5,000 Units by mouth daily.     cyclobenzaprine (FLEXERIL) 5 MG tablet TAKE 1 TABLET(5 MG) BY MOUTH THREE TIMES DAILY AS NEEDED FOR MUSCLE SPASMS 60 tablet 2   dextromethorphan-guaiFENesin (MUCINEX DM) 30-600 MG 12hr tablet Take 1 tablet by mouth 2 (two) times daily.     diclofenac Sodium (VOLTAREN) 1 % GEL Apply 4 g topically 4 (four) times daily. (Patient taking differently: Apply 4 g topically 4 (four) times daily as needed (pain).) 100 g 0   docusate sodium (COLACE) 100 MG capsule Take 1 capsule (100 mg total) by mouth 2 (two) times daily. 60 capsule 0   doxepin (SINEQUAN) 25 MG capsule TAKE 1 CAPSULE(25 MG) BY MOUTH AT BEDTIME AS NEEDED FOR ITCHING 30 capsule 0   estradiol  (ESTRACE) 0.5 MG tablet TAKE 1 TABLET BY MOUTH DAILY 90 tablet 4   famotidine (PEPCID) 20 MG tablet TAKE 1 TABLET(20 MG) BY MOUTH TWICE DAILY 64 tablet 5   fluticasone (FLONASE) 50 MCG/ACT nasal spray SHAKE LIQUID AND USE 2 SPRAYS IN EACH NOSTRIL DAILY 16 g 5   guaiFENesin-codeine 100-10 MG/5ML syrup Take 10 mLs by mouth 3 (three) times daily as needed for cough. 120 mL 0   hydrochlorothiazide (MICROZIDE) 12.5 MG capsule TAKE 1 CAPSULE(12.5 MG) BY MOUTH DAILY 90 capsule 3   hydrOXYzine (ATARAX/VISTARIL) 50 MG tablet Take 1 tablet (  50 mg total) by mouth at bedtime. 90 tablet 1   hydrOXYzine (VISTARIL) 25 MG capsule Take 1 capsule (25 mg total) by mouth every 8 (eight) hours as needed. 30 capsule 6   ipratropium-albuterol (DUONEB) 0.5-2.5 (3) MG/3ML SOLN Take 3 mLs by nebulization every 6 (six) hours as needed. 60 mL 0   irbesartan (AVAPRO) 75 MG tablet Take 1 tablet (75 mg total) by mouth daily. 90 tablet 3   montelukast (SINGULAIR) 10 MG tablet TAKE 1 TABLET(10 MG) BY MOUTH AT BEDTIME 30 tablet 2   nystatin (MYCOSTATIN) 100000 UNIT/ML suspension SHAKE WELL AND GIVE 5 ML BY MOUTH EVERY 6 HOURS FOR 7 DAYS 60 mL 0   Olopatadine HCl (PATADAY) 0.2 % SOLN Place 1 drop into both eyes 1 day or 1 dose. 2.5 mL 3   ondansetron (ZOFRAN ODT) 4 MG disintegrating tablet Take 1 tablet (4 mg total) by mouth every 8 (eight) hours as needed for nausea or vomiting. 20 tablet 0   oxyCODONE-acetaminophen (PERCOCET/ROXICET) 5-325 MG tablet Take 1-2 tablets by mouth every 4 (four) hours as needed for moderate pain. 30 tablet 0   pantoprazole (PROTONIX) 40 MG tablet TAKE 1 TABLET BY MOUTH EVERY DAY 90 tablet 1   potassium chloride (KLOR-CON) 10 MEQ tablet Take 1 tablet (10 mEq total) by mouth daily. 90 tablet 3   pravastatin (PRAVACHOL) 40 MG tablet Take 40 mg by mouth daily as needed.     TEZSPIRE 210 MG/1.91ML SOSY Inject into the skin.     triamcinolone cream (KENALOG) 0.1 % APPLY TOPICALLY TO THE AFFECTED AREA TWICE  DAILY 30 g 1   UNKNOWN TO PATIENT Weekly allergy shots     vitamin C (ASCORBIC ACID) 500 MG tablet Take 500 mg by mouth daily.      Current Facility-Administered Medications on File Prior to Visit  Medication Dose Route Frequency Provider Last Rate Last Admin   tezepelumab-ekko (TEZSPIRE) 210 MG/1.91ML syringe 210 mg  210 mg Subcutaneous Q28 days Valentina Shaggy, MD   210 mg at 08/08/21 [id number]        ROS:  All others reviewed and negative.  Objective        PE:  BP 114/70 (BP Location: Right Arm, Patient Position: Sitting, Cuff Size: Normal)   Pulse 79   Temp 98.7 F (37.1 C) (Oral)   Ht '5\' 3"'$  (1.6 m)   Wt 149 lb (67.6 kg)   SpO2 97%   BMI 26.39 kg/m                 Constitutional: Pt appears in NAD               HENT: Head: NCAT.                Right Ear: External ear normal.                 Left Ear: External ear normal.                Eyes: . Pupils are equal, round, and reactive to light. Conjunctivae and EOM are normal               Nose: without d/c or deformity               Neck: Neck supple. Gross normal ROM               Cardiovascular: Normal rate and regular rhythm.  Pulmonary/Chest: Effort normal and breath sounds decreased without rales but with bilat wheezing.                Abd:  Soft, NT, ND, + BS, no organomegaly               Neurological: Pt is alert. At baseline orientation, motor grossly intact               Skin: Skin is warm. No rashes, no other new lesions, LE edema - none               Psychiatric: Pt behavior is normal without agitation   Micro: none  Cardiac tracings I have personally interpreted today:  none  Pertinent Radiological findings (summarize): none   Lab Results  Component Value Date   WBC 18.7 Repeated and verified X2. (HH) 07/25/2021   HGB 12.3 07/25/2021   HCT 37.2 07/25/2021   PLT 385.0 07/25/2021   GLUCOSE 75 06/15/2021   CHOL 142 04/11/2021   TRIG 47.0 04/11/2021   HDL 64.30 04/11/2021   LDLCALC 69  04/11/2021   ALT 18 06/15/2021   AST 18 06/15/2021   NA 139 06/15/2021   K 4.2 06/15/2021   CL 101 06/15/2021   CREATININE 0.66 06/15/2021   BUN 14 06/15/2021   CO2 30 06/15/2021   TSH 0.47 04/11/2021   HGBA1C 6.2 04/11/2021   MICROALBUR 0.9 04/11/2021   Assessment/Plan:  [name] is a 60 y.o. Black or African American [2] female with  has a past medical history of ALLERGIC RHINITIS (05/12/2009), Allergy, Anemia, iron deficiency (12/22/2014), Anginal pain ([place]), Anxiety, Arthritis, Asthma, ASTHMA (05/12/2009), Bipolar disorder ([place]), COPD (08/24/2009), COPD (chronic obstructive pulmonary disease) ([place]), Depression, Dyspnea, Fibromyalgia (05/14/2014), GERD (gastroesophageal reflux disease), Headache, History of kidney stones, Hyperlipidemia (04/20/2017), HYPERTENSION (05/12/2009), Kidney stones, Peripheral vascular disease ([place]), Pneumonia, Prediabetes (02/23/2014), Seizure ([place]), Seizure ([place]), Stroke ([place]) (11/2020), and Urticaria.  Prediabetes Lab Results  Component Value Date   HGBA1C 6.2 04/11/2021   Stable, pt to continue current medical treatment  - diet   Essential (primary) hypertension BP Readings from Last 3 Encounters:  08/15/21 114/70  08/09/21 120/80  07/17/21 104/70   Stable, pt to continue medical treatment hct, avapro   COPD exacerbation (HCC) Mild to mod, for depomedrol im 80, predpac asd, f/u pulmonary,  to f/u any worsening symptoms or concerns  Leukocytosis Often chronic persistent, unclear if related to steroid use, for heme referral  Followup: Return if symptoms worsen or fail to improve.  Cathlean Cower, MD 08/19/2021 7:51 PM [facility]

## 2021-08-16 ENCOUNTER — Telehealth: Payer: Self-pay | Admitting: Internal Medicine

## 2021-08-19 ENCOUNTER — Encounter: Payer: Self-pay | Admitting: Internal Medicine

## 2021-08-19 NOTE — Assessment & Plan Note (Signed)
Mild to mod, for depomedrol im 80, predpac asd, f/u pulmonary,  to f/u any worsening symptoms or concerns

## 2021-08-19 NOTE — Assessment & Plan Note (Signed)
Often chronic persistent, unclear if related to steroid use, for heme referral

## 2021-08-19 NOTE — Assessment & Plan Note (Signed)
Lab Results  Component Value Date   HGBA1C 6.2 04/11/2021   Stable, pt to continue current medical treatment  - diet

## 2021-08-19 NOTE — Assessment & Plan Note (Signed)
BP Readings from Last 3 Encounters:  08/15/21 114/70  08/09/21 120/80  07/17/21 104/70   Stable, pt to continue medical treatment hct, avapro

## 2021-08-22 ENCOUNTER — Ambulatory Visit (INDEPENDENT_AMBULATORY_CARE_PROVIDER_SITE_OTHER): Payer: Medicare HMO

## 2021-08-22 DIAGNOSIS — J309 Allergic rhinitis, unspecified: Secondary | ICD-10-CM | POA: Diagnosis not present

## 2021-08-24 MED ORDER — TRUE METRIX LEVEL 1 LOW VI SOLN: 3 refills | Status: DC

## 2021-08-24 MED ORDER — BD SWAB SINGLE USE REGULAR PADS: MEDICATED_PAD | 3 refills | Status: DC

## 2021-08-25 ENCOUNTER — Telehealth: Payer: Self-pay

## 2021-08-31 ENCOUNTER — Telehealth: Payer: Self-pay | Admitting: *Deleted

## 2021-08-31 ENCOUNTER — Ambulatory Visit (INDEPENDENT_AMBULATORY_CARE_PROVIDER_SITE_OTHER): Payer: Medicare HMO | Admitting: *Deleted

## 2021-08-31 DIAGNOSIS — J309 Allergic rhinitis, unspecified: Secondary | ICD-10-CM | POA: Diagnosis not present

## 2021-08-31 NOTE — Telephone Encounter (Signed)
Patient states that she feels that the [place] was better for her asthma than the Tezspire and she would like to switch back to [place].

## 2021-09-01 NOTE — Telephone Encounter (Signed)
Sounds good!  I am fine changing back.  Salvatore Marvel, MD [facility]

## 2021-09-01 NOTE — Telephone Encounter (Signed)
Called patient and l/m for her to call me to advise change back to Dupixent

## 2021-09-05 ENCOUNTER — Ambulatory Visit: Payer: Medicare HMO | Admitting: Allergy & Immunology

## 2021-09-05 ENCOUNTER — Ambulatory Visit (INDEPENDENT_AMBULATORY_CARE_PROVIDER_SITE_OTHER): Payer: Medicare HMO | Admitting: *Deleted

## 2021-09-05 ENCOUNTER — Encounter: Payer: Self-pay | Admitting: Allergy & Immunology

## 2021-09-05 ENCOUNTER — Other Ambulatory Visit: Payer: Self-pay

## 2021-09-05 ENCOUNTER — Encounter: Payer: Self-pay | Admitting: *Deleted

## 2021-09-05 VITALS — BP 120/84 | HR 87 | Temp 97.6°F | Resp 16 | Ht 63.0 in | Wt 153.1 lb

## 2021-09-05 DIAGNOSIS — J449 Chronic obstructive pulmonary disease, unspecified: Secondary | ICD-10-CM

## 2021-09-05 DIAGNOSIS — J3089 Other allergic rhinitis: Secondary | ICD-10-CM

## 2021-09-05 DIAGNOSIS — K219 Gastro-esophageal reflux disease without esophagitis: Secondary | ICD-10-CM

## 2021-09-05 DIAGNOSIS — J4540 Moderate persistent asthma, uncomplicated: Secondary | ICD-10-CM

## 2021-09-05 NOTE — Telephone Encounter (Signed)
L/m for patient to contact me  

## 2021-09-05 NOTE — Patient Instructions (Addendum)
1. Asthma-COPD overlap syndrome - Lung testing looks slightly worse.  - We are going to change back to Dupixent.  - Daily controller medication(s): Breztri 131mcg two puffs twice daily with spacer + Dupixent every two weeks - Prior to physical activity: Proventil (ORANAGE INHALER) 2 puffs or Ventolin (GRAY INHALER) 2 puffs 10-15 minutes before physical activity. - Rescue medications: Proventil (ORANAGE INHALER) 2 puffs or Ventolin (GRAY INHALER) 2 puffs AND 2 puffs Atrovent (GREEN INHALER) every 4-6 hours as needed - Asthma control goals:  * Full participation in all desired activities (may need albuterol before activity) * Albuterol use two time or less a week on average (not counting use with activity) * Cough interfering with sleep two time or less a month * Oral steroids no more than once a year * No hospitalizations   2. Chronic urticaria - Continue with Xyzal twice daily. - Continue with famotidine twice daily. - Continue with Doxepin 25mg  at night to help with itching.  - Add on triamcinolone ointment mixed with Eucerin twice daily to the entire body. - Once the Dupixent is back on board, hopefully this will help.   3. 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 - Continue with allergy shots.   4. Return in about 3 months (around 12/05/2021).    Please inform us of any Emergency Department visits, hospitalizations, or changes in symptoms. Call us before going to the ED for breathing or allergy symptoms since we might be able to fit you in for a sick visit. Feel free to contact us anytime with any questions, problems, or concerns.  It was a pleasure to see you again today!  Websites that have reliable patient information:  1. American Academy of Asthma, Allergy, and Immunology: www.aaaai.org 2. Food Allergy Research and Education (FARE): foodallergy.org 3. Mothers of Asthmatics:  http://www.asthmacommunitynetwork.org 4. American College of Allergy, Asthma, and Immunology: www.acaai.org   COVID-19 Vaccine Information can be found at: ShippingScam.co.uk For questions related to vaccine distribution or appointments, please email [email or web address] or call [phone].     "Like" [place] on Facebook and Instagram for our latest updates!   .  They are in [place], [place],.    Make sure you are registered to vote! If you have moved or changed any of your contact information, you will need to get this updated before voting!  In some cases, you MAY be able to register to vote online: [email or web address]

## 2021-09-05 NOTE — Progress Notes (Signed)
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 chest CT next visit)    Perennial allergic rhinitis (dust mites) - on allergen immunotherapy   Chronic urticaria    Recurrent infections - with largely normal workup aside from non-protective Pneumococcal titers and non-protective Diptheria titers   Recent stroke - with no correlation with Dupixent (confirmed with the [facility])   Plan/Recommendations:   1. Asthma-COPD overlap syndrome - Lung testing looks slightly worse.  - We are going to change back to Dupixent.  - Daily controller medication(s): Breztri 15mcg two puffs twice daily with spacer + Dupixent every two weeks - Prior to physical activity: Proventil (ORANAGE INHALER) 2 puffs or Ventolin (GRAY INHALER) 2 puffs 10-15 minutes before physical activity. - Rescue medications: Proventil (ORANAGE INHALER) 2 puffs or Ventolin (GRAY INHALER) 2 puffs AND 2 puffs Atrovent (GREEN INHALER) every 4-6 hours as needed - Asthma control goals:  * Full participation in all desired activities (may need albuterol before activity) * Albuterol use two time or less a week on average (not counting use with activity) * Cough interfering with sleep two time or less a month * Oral steroids no more than once a year * No hospitalizations   2. Chronic urticaria - Continue with Xyzal twice daily. - Continue with famotidine twice daily. - Continue with Doxepin 25mg  at night to help with itching.  - Add on triamcinolone ointment mixed with Eucerin twice daily to the entire body. - Once the Dupixent is back on board, hopefully this will help.   3. 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 - Continue with allergy shots.   4. Return in about 3 months (around 12/05/2021).    Subjective:   [name] is a 60 y.o.  female presenting today for follow up of  Chief Complaint  Patient presents with   Asthma   Follow-up    [name] has a history of the following: Patient Active Problem List   Diagnosis Date Noted   COPD exacerbation ([place]) 07/17/2021   Abnormal CXR 07/17/2021   Aortic atherosclerosis ([place]) 07/05/2021   URI (upper respiratory infection) 07/05/2021   Arthrodesis status 06/20/2021   Drug allergy 05/14/2021   Cough 05/14/2021   Bilateral leg weakness 04/17/2021   Status post cervical spinal fusion 12/27/2020   History of stroke 12/11/2020   Medication management 10/21/2020   Body mass index (BMI) 26.0-26.9, adult 08/23/2020   Healthcare maintenance 08/11/2020   Cervical spondylosis with myelopathy and radiculopathy 07/28/2020   Preop exam for internal medicine 06/19/2020   Hypotension due to medication 06/11/2020   Status asthmaticus 06/09/2020   Essential (primary) hypertension 05/17/2020   Other spondylosis with myelopathy, cervical region 02/16/2020   Cervical spondylosis 02/02/2020   Radiculopathy, cervical region 02/02/2020   Spondylolysis, cervical region 02/02/2020   Neck pain 02/02/2020   Leg cramping 08/12/2019   Laryngopharyngeal reflux (LPR) 05/26/2019   Sinus pressure 05/26/2019   Hypophosphatemia 01/07/2019   Vertigo 09/23/2018   Dysfunction of left eustachian tube 09/23/2018   Gait disorder 04/21/2018   Leukocytosis 04/15/2018   Nausea & vomiting 04/15/2018   Seizure ([place])    Prolapsed cervical intervertebral disc 03/11/2018   Cervical radiculopathy 02/18/2018   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   Syncope 09/04/2017   Constipation 09/04/2017   Nonintractable headache 09/04/2017   Leg swelling 07/26/2017  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   Hyperlipidemia 04/20/2017   Pedal edema 04/19/2017   Bipolar I disorder ([place]) 09/19/2016   Facial pain 07/12/2016   Rash 06/14/2016   Migraine 01/25/2016   Idiopathic urticaria/pruritus  01/23/2016   Allergic rhinitis 01/23/2016   Oral candidiasis 01/23/2016   Greater trochanteric bursitis of both hips 09/08/2015   Chest pain 06/22/2015   Itching 06/22/2015   Bilateral knee pain 06/22/2015   Bilateral hip pain 06/22/2015   Anemia, iron deficiency 12/22/2014   Fatigue 12/21/2014   Intertrigo 08/07/2014   Dyspareunia 07/12/2014   Menopausal syndrome (hot flushes) 07/12/2014   History of tobacco abuse 07/12/2014   Dizziness 06/22/2014   Weakness 06/22/2014   Fibromyalgia 05/14/2014   Encounter for well adult exam with abnormal findings 04/22/2014   Recurrent boils 04/22/2014   Recurrent falls 04/22/2014   Peripheral vascular disease ([place])    Depression    Anxiety    GERD (gastroesophageal reflux disease)    Prediabetes 02/23/2014   Screening mammogram for high-risk patient 02/23/2014   Back pain 07/22/2013   COPD/asthma 08/24/2009   Headache(784.0) 08/24/2009    History obtained from: chart review and patient.  [name] is a 60 y.o. female presenting for a follow up visit.  She was last seen in May 2022.  At that time, lung testing looks stable.  We continued with Breztri 2 puffs twice daily as well as Tezsp[place] monthly.  We will change from [place] because she had a stroke, and although I did not feel it was related she wanted to change anyway.  We continued with her Xyzal twice daily as well as famotidine twice daily for her urticaria.  We also continue with doxepin 20 mg at night.  For her mixed rhinitis, we continue with Flonase as well as allergy shots.  In the interim, she contacted Tammy because she did not feel that the Tezspire was as good as Dupixent.  Has not returned his calls so she has not suppressed her back.  Asthma/Respiratory Symptom History: She feels that she was doing better on Dupixent. She did get a steroid shot.  That is the only steroid she has had since last visit.  She has not been able to play basketball with her grandson. She was able  to do that on Dupixent.  She remains on the Breztri 2 puffs twice daily.  She reports using her rescue inhaler much more often, at least once a day, often twice.  Allergic Rhinitis Symptom History: She remains on Flonase 1 spray per nostril twice daily.  She is also on her allergen immunotherapy. She has not needed antibiotics since last visit.  Skin Symptom History: She is having a lot of itching that started Friday night. She is unsure what was causing these symptoms. She was eating her normal meal.  She remains on her Xyzal as well as famotidine twice a day.  His 87yo grandson is having some issues at school with losing shoes and being accused of carrying a gun.  She has 5 grandchildren in total. There are in [place], [place], and [place].   Otherwise, there have been no changes to her past medical history, surgical history, family history, or social history.    Review of Systems  Constitutional: Negative.  Negative for chills, fever, malaise/fatigue and weight loss.  HENT:  Negative for congestion, ear discharge, ear pain and sinus pain.   Eyes:  Negative for pain, discharge and redness.  Respiratory:  Negative for cough, sputum production,  shortness of breath and wheezing.   Cardiovascular: Negative.  Negative for chest pain and palpitations.  Gastrointestinal:  Negative for abdominal pain, constipation, diarrhea, heartburn, nausea and vomiting.  Skin: Negative.  Negative for itching and rash.  Neurological:  Negative for dizziness and headaches.  Endo/Heme/Allergies:  Positive for environmental allergies. Does not bruise/bleed easily.      Objective:   Blood pressure 120/84, pulse 87, temperature 97.6 F (36.4 C), temperature source Temporal, resp. rate 16, height 5\' 3"  (1.6 m), weight 153 lb 2 oz (69.5 kg), SpO2 96 %. Body mass index is 27.12 kg/m.   Physical Exam:  Physical Exam Constitutional:      Appearance: She is well-developed.     Comments: Boisterous and  interactive.  HENT:     Head: Normocephalic and atraumatic.     Right Ear: Tympanic membrane, ear canal and external ear normal.     Left Ear: Tympanic membrane, ear canal and external ear normal.     Nose: No nasal deformity, septal deviation, mucosal edema or rhinorrhea.     Right Turbinates: Enlarged, swollen and pale.     Left Turbinates: Enlarged, swollen and pale.     Right Sinus: No maxillary sinus tenderness or frontal sinus tenderness.     Left Sinus: No maxillary sinus tenderness or frontal sinus tenderness.     Mouth/Throat:     Mouth: Mucous membranes are not pale and not dry.     Pharynx: Uvula midline.  Eyes:     General: Lids are normal. No allergic shiner.       Right eye: No discharge.        Left eye: No discharge.     Conjunctiva/sclera: Conjunctivae normal.     Right eye: Right conjunctiva is not injected. No chemosis.    Left eye: Left conjunctiva is not injected. No chemosis.    Pupils: Pupils are equal, round, and reactive to light.  Cardiovascular:     Rate and Rhythm: Normal rate and regular rhythm.     Heart sounds: Normal heart sounds.  Pulmonary:     Effort: Pulmonary effort is normal. No tachypnea, accessory muscle usage or respiratory distress.     Breath sounds: Wheezing present. No rhonchi or rales.     Comments: Expiratory wheezes noted most prominently at the bases, but present throughout.  Speaking in full sentences.  No respiratory distress. Chest:     Chest wall: No tenderness.  Lymphadenopathy:     Cervical: No cervical adenopathy.  Skin:    Coloration: Skin is not pale.     Findings: No abrasion, erythema, petechiae or rash. Rash is not papular, urticarial or vesicular.  Neurological:     Mental Status: She is alert.  Psychiatric:        Behavior: Behavior is cooperative.     Diagnostic studies:   Spirometry: results abnormal (FEV1: 0.68/33%, FVC: 1.51/58%, FEV1/FVC: 45%).    Spirometry consistent with mixed obstructive and  restrictive disease.   Allergy Studies: none        Salvatore Marvel, MD  [facility]

## 2021-09-06 ENCOUNTER — Encounter: Payer: Self-pay | Admitting: Allergy & Immunology

## 2021-09-06 MED ORDER — BREZTRI AEROSPHERE 160-9-4.8 MCG/ACT IN AERO: INHALATION_SPRAY | RESPIRATORY_TRACT | 5 refills | Status: DC

## 2021-09-06 MED ORDER — FLUTICASONE PROPIONATE 50 MCG/ACT NA SUSP
1.0000 | Freq: Two times a day (BID) | NASAL | 5 refills | Status: DC | PRN
Start: 2021-09-06 — End: 2022-10-19

## 2021-09-06 MED ORDER — LEVOCETIRIZINE DIHYDROCHLORIDE 5 MG PO TABS: 5.0000 mg | ORAL_TABLET | Freq: Every evening | ORAL | 5 refills | Status: DC

## 2021-09-06 MED ORDER — ALBUTEROL SULFATE HFA 108 (90 BASE) MCG/ACT IN AERS: 2.0000 | INHALATION_SPRAY | RESPIRATORY_TRACT | 2 refills | Status: DC | PRN

## 2021-09-06 MED ORDER — FAMOTIDINE 20 MG PO TABS: 20.0000 mg | ORAL_TABLET | Freq: Two times a day (BID) | ORAL | 5 refills | Status: DC

## 2021-09-06 MED ORDER — TRIAMCINOLONE ACETONIDE 0.1 % EX OINT: 1.0000 "application " | TOPICAL_OINTMENT | Freq: Two times a day (BID) | CUTANEOUS | 1 refills | Status: DC

## 2021-09-07 NOTE — Telephone Encounter (Signed)
Spoke to patient and advised Dupixent restart. Will send new Rx to pharmacy

## 2021-09-20 ENCOUNTER — Other Ambulatory Visit: Payer: Self-pay

## 2021-09-20 ENCOUNTER — Ambulatory Visit (INDEPENDENT_AMBULATORY_CARE_PROVIDER_SITE_OTHER): Payer: Medicare HMO | Admitting: *Deleted

## 2021-09-20 DIAGNOSIS — J4540 Moderate persistent asthma, uncomplicated: Secondary | ICD-10-CM

## 2021-09-20 MED ORDER — DUPILUMAB 300 MG/2ML ~~LOC~~ SOSY
600.0000 mg | PREFILLED_SYRINGE | Freq: Once | SUBCUTANEOUS | Status: AC
  Administered 2021-09-20: 600 mg via SUBCUTANEOUS

## 2021-09-20 MED ORDER — DUPILUMAB 300 MG/2ML ~~LOC~~ SOSY
300.0000 mg | PREFILLED_SYRINGE | SUBCUTANEOUS | Status: DC
  Administered 2021-10-04 – 2022-05-23 (×16): 300 mg via SUBCUTANEOUS

## 2021-09-20 NOTE — Progress Notes (Signed)
Immunotherapy   Patient Details  Name: [name] MRN: [id number] Date of Birth: [birth year]  09/20/2021  [name] started injections for  Dupixent  Frequency: 300mg  every 2 weeks  Epi-Pen:Epi-Pen Available  Consent signed and patient instructions given. Patient restarted Dupixent today and received 600mg  loading dose. Patient waited 30 minutes and did not experience any issues.    [name] 09/20/2021, 10:02 AM

## 2021-09-21 ENCOUNTER — Encounter: Payer: Self-pay | Admitting: Obstetrics & Gynecology

## 2021-09-21 ENCOUNTER — Ambulatory Visit (INDEPENDENT_AMBULATORY_CARE_PROVIDER_SITE_OTHER): Payer: Medicare HMO | Admitting: Obstetrics & Gynecology

## 2021-09-21 VITALS — BP 114/74 | HR 68 | Resp 16 | Ht 61.5 in | Wt 152.0 lb

## 2021-09-21 DIAGNOSIS — Z01419 Encounter for gynecological examination (general) (routine) without abnormal findings: Secondary | ICD-10-CM | POA: Diagnosis not present

## 2021-09-21 DIAGNOSIS — Z0000 Encounter for general adult medical examination without abnormal findings: Secondary | ICD-10-CM | POA: Diagnosis not present

## 2021-09-21 DIAGNOSIS — Z79890 Hormone replacement therapy (postmenopausal): Secondary | ICD-10-CM

## 2021-09-21 DIAGNOSIS — Z9189 Other specified personal risk factors, not elsewhere classified: Secondary | ICD-10-CM | POA: Diagnosis not present

## 2021-09-21 DIAGNOSIS — Z90710 Acquired absence of both cervix and uterus: Secondary | ICD-10-CM | POA: Diagnosis not present

## 2021-09-21 DIAGNOSIS — N9412 Deep dyspareunia: Secondary | ICD-10-CM | POA: Diagnosis not present

## 2021-09-21 MED ORDER — ESTRADIOL 0.5 MG PO TABS: 0.5000 mg | ORAL_TABLET | Freq: Every day | ORAL | 4 refills | Status: DC

## 2021-09-21 NOTE — Progress Notes (Signed)
[name] 1961-03-14 [id number]   History:    60 y.o. [id number] Single   RP:  Established patient presenting for annual gyn exam    HPI: S/P total Hysterectomy.  Postmenopause, well on Estradiol 0.5 mg tab PO daily. No pelvic pain.  New boyfriend.  Strict condom use.  Deep dyspareunia.  Breasts normal.  Body mass index improved to 28.26.  Exercising more, walking.  Better nutrition.  Health labs with family physician.    Past medical history,surgical history, family history and social history were all reviewed and documented in the EPIC chart.  Gynecologic History No LMP recorded. Patient has had a hysterectomy.  Obstetric History OB History  Gravida Para Term Preterm AB Living  7 3 3   4 3   SAB IAB Ectopic Multiple Live Births  3 1          # Outcome Date GA Lbr Len/2nd Weight Sex Delivery Anes PTL Lv  7 IAB           6 SAB           5 SAB           4 SAB           3 Term           2 Term           1 Term              ROS: A ROS was performed and pertinent positives and negatives are included in the history.  GENERAL: No fevers or chills. HEENT: No change in vision, no earache, sore throat or sinus congestion. NECK: No pain or stiffness. CARDIOVASCULAR: No chest pain or pressure. No palpitations. PULMONARY: No shortness of breath, cough or wheeze. GASTROINTESTINAL: No abdominal pain, nausea, vomiting or diarrhea, melena or bright red blood per rectum. GENITOURINARY: No urinary frequency, urgency, hesitancy or dysuria. MUSCULOSKELETAL: No joint or muscle pain, no back pain, no recent trauma. DERMATOLOGIC: No rash, no itching, no lesions. ENDOCRINE: No polyuria, polydipsia, no heat or cold intolerance. No recent change in weight. HEMATOLOGICAL: No anemia or easy bruising or bleeding. NEUROLOGIC: No headache, seizures, numbness, tingling or weakness. PSYCHIATRIC: No depression, no loss of interest in normal activity or change in sleep pattern.     Exam:   BP 114/74    Pulse 68   Resp 16   Ht 5' 1.5" (1.562 m)   Wt 152 lb (68.9 kg)   BMI 28.26 kg/m   Body mass index is 28.26 kg/m.  General appearance : Well developed well nourished female. No acute distress HEENT: Eyes: no retinal hemorrhage or exudates,  Neck supple, trachea midline, no carotid bruits, no thyroidmegaly Lungs: Clear to auscultation, no rhonchi or wheezes, or rib retractions  Heart: Regular rate and rhythm, no murmurs or gallops Breast:Examined in sitting and supine position were symmetrical in appearance, no palpable masses or tenderness,  no skin retraction, no nipple inversion, no nipple discharge, no skin discoloration, no axillary or supraclavicular lymphadenopathy Abdomen: no palpable masses or tenderness, no rebound or guarding Extremities: no edema or skin discoloration or tenderness  Pelvic: Vulva: Normal             Vagina: No gross lesions or discharge  Cervix/Uterus absent  Adnexa  Without masses or tenderness  Anus: Normal   Assessment/Plan:  60 y.o. female for annual exam   1. Well female exam with routine gynecological exam Gynecologic exam status post total hysterectomy.  No indication for Pap test at this time.  Breast exam normal.  Screening mammogram negative in June 2022.  Colonoscopy 2022.  Health labs with family physician.  Body mass index 28.26.  Continue with fitness and healthy nutrition.  2. S/P total hysterectomy  3. Postmenopausal hormone replacement therapy No contraindication to continue on hormone replacement therapy.  Estradiol 0.5 mg/tab 1 tablet daily represcribed.  4. Deep dyspareunia Counseling on deep dyspareunia done.  We will try coconut oil and changes in position.  Other orders - estradiol (ESTRACE) 0.5 MG tablet; Take 1 tablet (0.5 mg total) by mouth daily.   Princess Bruins MD, 10:03 AM 09/21/2021

## 2021-09-22 ENCOUNTER — Ambulatory Visit (INDEPENDENT_AMBULATORY_CARE_PROVIDER_SITE_OTHER): Payer: Medicare HMO

## 2021-09-22 DIAGNOSIS — J309 Allergic rhinitis, unspecified: Secondary | ICD-10-CM

## 2021-09-27 ENCOUNTER — Ambulatory Visit (INDEPENDENT_AMBULATORY_CARE_PROVIDER_SITE_OTHER): Payer: Medicare HMO

## 2021-09-27 DIAGNOSIS — J309 Allergic rhinitis, unspecified: Secondary | ICD-10-CM

## 2021-10-03 ENCOUNTER — Ambulatory Visit (INDEPENDENT_AMBULATORY_CARE_PROVIDER_SITE_OTHER): Payer: Medicare HMO

## 2021-10-03 DIAGNOSIS — J309 Allergic rhinitis, unspecified: Secondary | ICD-10-CM

## 2021-10-04 ENCOUNTER — Ambulatory Visit (INDEPENDENT_AMBULATORY_CARE_PROVIDER_SITE_OTHER): Payer: Medicare HMO | Admitting: *Deleted

## 2021-10-04 ENCOUNTER — Other Ambulatory Visit: Payer: Self-pay

## 2021-10-04 ENCOUNTER — Ambulatory Visit: Payer: Self-pay | Admitting: *Deleted

## 2021-10-04 ENCOUNTER — Ambulatory Visit: Payer: Medicare HMO

## 2021-10-04 DIAGNOSIS — J4540 Moderate persistent asthma, uncomplicated: Secondary | ICD-10-CM

## 2021-10-05 ENCOUNTER — Ambulatory Visit: Payer: Medicare HMO

## 2021-10-11 ENCOUNTER — Other Ambulatory Visit: Payer: Self-pay

## 2021-10-11 ENCOUNTER — Encounter: Payer: Self-pay | Admitting: Internal Medicine

## 2021-10-11 ENCOUNTER — Ambulatory Visit (INDEPENDENT_AMBULATORY_CARE_PROVIDER_SITE_OTHER): Payer: Medicare HMO | Admitting: Internal Medicine

## 2021-10-11 VITALS — BP 120/80 | HR 83 | Resp 18 | Ht 61.5 in | Wt 152.6 lb

## 2021-10-11 DIAGNOSIS — I10 Essential (primary) hypertension: Secondary | ICD-10-CM | POA: Diagnosis not present

## 2021-10-11 DIAGNOSIS — J4541 Moderate persistent asthma with (acute) exacerbation: Secondary | ICD-10-CM

## 2021-10-11 DIAGNOSIS — J069 Acute upper respiratory infection, unspecified: Secondary | ICD-10-CM | POA: Diagnosis not present

## 2021-10-11 DIAGNOSIS — R7303 Prediabetes: Secondary | ICD-10-CM

## 2021-10-11 DIAGNOSIS — M545 Low back pain, unspecified: Secondary | ICD-10-CM | POA: Diagnosis not present

## 2021-10-11 DIAGNOSIS — J45901 Unspecified asthma with (acute) exacerbation: Secondary | ICD-10-CM | POA: Insufficient documentation

## 2021-10-11 LAB — URINALYSIS, ROUTINE W REFLEX MICROSCOPIC
Bilirubin Urine: NEGATIVE
Hgb urine dipstick: NEGATIVE
Ketones, ur: NEGATIVE
Leukocytes,Ua: NEGATIVE
Nitrite: NEGATIVE
Specific Gravity, Urine: 1.01 (ref 1.000–1.030)
Total Protein, Urine: NEGATIVE
Urine Glucose: NEGATIVE
Urobilinogen, UA: 0.2 (ref 0.0–1.0)
pH: 5.5 (ref 5.0–8.0)

## 2021-10-11 MED ORDER — METHYLPREDNISOLONE ACETATE 80 MG/ML IJ SUSP
80.0000 mg | Freq: Once | INTRAMUSCULAR | Status: AC
  Administered 2021-10-11: 80 mg via INTRAMUSCULAR

## 2021-10-11 MED ORDER — PREDNISONE 10 MG PO TABS: ORAL_TABLET | ORAL | 0 refills | Status: DC

## 2021-10-11 MED ORDER — BREZTRI AEROSPHERE 160-9-4.8 MCG/ACT IN AERO: INHALATION_SPRAY | RESPIRATORY_TRACT | 5 refills | Status: DC

## 2021-10-11 MED ORDER — ALBUTEROL SULFATE HFA 108 (90 BASE) MCG/ACT IN AERS: 2.0000 | INHALATION_SPRAY | RESPIRATORY_TRACT | 11 refills | Status: DC | PRN

## 2021-10-11 MED ORDER — PROMETHAZINE-DM 6.25-15 MG/5ML PO SYRP: 5.0000 mL | ORAL_SOLUTION | Freq: Four times a day (QID) | ORAL | 1 refills | Status: DC | PRN

## 2021-10-11 MED ORDER — CYCLOBENZAPRINE HCL 5 MG PO TABS: ORAL_TABLET | ORAL | 5 refills | Status: DC

## 2021-10-11 MED ORDER — AZITHROMYCIN 250 MG PO TABS: ORAL_TABLET | ORAL | 0 refills | Status: AC

## 2021-10-11 NOTE — Patient Instructions (Addendum)
You had the steroid shot today  Please take all new medication as prescribed- the antibiotic, cough medicine, prednisone, and the muscle relaxer  Please continue all other medications as before, and refills have been done if requested - the inhalers  Please have the pharmacy call with any other refills you may need.  Please continue your efforts at being more active, low cholesterol diet, and weight control.  Please keep your appointments with your specialists as you may have planned  Please go to the LAB at the blood drawing area for the tests to be done - just the urine testing toda  You will be contacted by phone if any changes need to be made immediately.  Otherwise, you will receive a letter about your results with an explanation, but please check with MyChart first.  Please remember to sign up for MyChart if you have not done so, as this will be important to you in the future with finding out test results, communicating by private email, and scheduling acute appointments online when needed.  Please make an Appointment to return in 6 months, or sooner if needed

## 2021-10-11 NOTE — Progress Notes (Signed)
Patient ID: [name], female   DOB: [birth year], 60 y.o.   MRN: [id number]        Chief Complaint: follow up left lower back pain, and persistent wheezing       HPI:  [name] is a 60 y.o. female Here with acute onset mild to mod 2-3 days ST, HA, general weakness and malaise, with prod cough greenish sputum, but Pt denies chest pain, increased sob or doe, wheezing, orthopnea, PND, increased LE swelling, palpitations, dizziness or syncope, except for worsening wheezing sob doe since last pm as well.  Has hx of difficult to control asthma, and spotty med use it seems due to med affordability.  Pt also c/o left lumbar paravertebral LBP for 4 days after a trip and fall at home to her knees with no immediate pain or injury, and no bowel or bladder change, fever, wt loss,  worsening LE pain/numbness/weakness, gait change or falls.  Has run out of her muscle relaxers.  Asking for UA but Denies urinary symptoms such as dysuria, frequency, urgency, flank pain, hematuria or n/v, fever, chills.         Wt Readings from Last 3 Encounters:  10/11/21 152 lb 9.6 oz (69.2 kg)  09/21/21 152 lb (68.9 kg)  09/05/21 153 lb 2 oz (69.5 kg)   BP Readings from Last 3 Encounters:  10/11/21 120/80  09/21/21 114/74  09/05/21 120/84         Past Medical History:  Diagnosis Date   ALLERGIC RHINITIS 05/12/2009   Qualifier: Diagnosis of  By: Lenn Cal Deborra Medina), [name]     Allergy    Anemia, iron deficiency 12/22/2014   pt. denies   Anginal pain ([place])    Anxiety    Arthritis    Asthma    ASTHMA 05/12/2009   Severe AFL (Spirometry 05/2009: pre-BD FEV1 0.87L 34% pred, post-BD FEV1 1.11L 44% pred) Volumes hyperinflated Decreased DLCO that does not fully correct to normal range for alveolar volume.      Bipolar disorder ([place])    COPD 08/24/2009   Qualifier: Diagnosis of  By: Burnett Kanaris     COPD (chronic obstructive pulmonary disease) ([place])    Depression    Dyspnea    sometimes when exerting  self   Fibromyalgia 05/14/2014   GERD (gastroesophageal reflux disease)    Headache    History of kidney stones    Hyperlipidemia 04/20/2017   HYPERTENSION 05/12/2009   Qualifier: Diagnosis of  By: Lenn Cal Deborra Medina), [name]     Kidney stones    Peripheral vascular disease ([place])    Pneumonia    Prediabetes 02/23/2014   pt. denies   Seizure ([place])    Seizure ([place])    Stroke ([place]) 11/2020   Urticaria    Past Surgical History:  Procedure Laterality Date   ABDOMINAL HYSTERECTOMY     ANTERIOR CERVICAL DECOMP/DISCECTOMY FUSION N/A 07/28/2020   Procedure: ANTERIOR CERVICAL DECOMPRESSION/DISCECTOMY FUSION. INTERBODY PROTHESIS, PLATE/SCREWS CERVICAL THREE- CERVICAL FOUR, CERVICAL FOUR- CERVICAL FIVE;  Surgeon: Newman Pies, MD;  Location: [facility];  Service: Neurosurgery;  Laterality: N/A;   BACK SURGERY     COLONOSCOPY  12/20/2011   Procedure: COLONOSCOPY;  Surgeon: Landry Dyke, MD;  Location: [facility];  Service: Endoscopy;  Laterality: N/A;   COLONOSCOPY  03/05/2012   Procedure: COLONOSCOPY;  Surgeon: Landry Dyke, MD;  Location: [facility];  Service: Endoscopy;  Laterality: N/A;   DIAGNOSTIC LAPAROSCOPY     HEMORRHOID SURGERY  INCISE AND DRAIN ABCESS     KIDNEY STONE SURGERY     NECK SURGERY     x 2 Dr Orinda Kenner   SPINE SURGERY  2019   TOE SURGERY     TUBAL LIGATION      reports that she quit smoking about 2 years ago. Her smoking use included cigarettes. She has a 9.00 pack-year smoking history. She has never used smokeless tobacco. She reports that she does not currently use alcohol. She reports current drug use. Drug: Marijuana. family history includes Alcohol abuse in her brother and father; Anemia in her daughter, sister, and sister; Anesthesia problems in her father; Asthma in her brother, mother, and sister; Brain cancer in her sister; COPD in her mother and sister; Colon cancer (age of onset: 35) in her father; Diabetes in her brother, father, mother, sister,  sister, sister, and sister; Heart disease in her brother, brother, mother, and sister; Hypertension in her brother, daughter, sister, sister, sister, sister, and sister; Kidney disease in her father; Sleep apnea in her brother. Allergies  Allergen Reactions   Sulfa Antibiotics Nausea And Vomiting   Venofer [Iron Sucrose] Hives   Current Outpatient Medications on File Prior to Visit  Medication Sig Dispense Refill   albuterol (PROVENTIL) (2.5 MG/3ML) 0.083% nebulizer solution Take 3 mLs (2.5 mg total) by nebulization every 6 (six) hours as needed for wheezing or shortness of breath. Reported on 06/04/2016 120 mL 11   Alcohol Swabs (B-D SINGLE USE SWABS REGULAR) PADS Use as directed twice per day E11.9 200 each 3   atorvastatin (LIPITOR) 40 MG tablet Take 1 tablet (40 mg total) by mouth daily. 90 tablet 3   Biotin 1000 MCG tablet Take 1,000 mcg by mouth daily.     Blood Glucose Calibration (TRUE METRIX LEVEL 1) Low SOLN Use as directed once per day E11.9 3 each 3   carbamazepine (TEGRETOL) 200 MG tablet Take 2 tablets (400 mg total) by mouth 2 (two) times daily. 120 tablet 12   cetirizine (ZYRTEC) 10 MG tablet Can take one tablet by mouth once daily if needed for runny nose. 30 tablet 5   Cholecalciferol (VITAMIN D) 125 MCG (5000 UT) CAPS Take 5,000 Units by mouth daily.     diclofenac Sodium (VOLTAREN) 1 % GEL Apply 4 g topically 4 (four) times daily. (Patient taking differently: Apply 4 g topically 4 (four) times daily as needed (pain).) 100 g 0   doxepin (SINEQUAN) 25 MG capsule TAKE 1 CAPSULE(25 MG) BY MOUTH AT BEDTIME AS NEEDED FOR ITCHING 30 capsule 0   estradiol (ESTRACE) 0.5 MG tablet Take 1 tablet (0.5 mg total) by mouth daily. 90 tablet 4   famotidine (PEPCID) 20 MG tablet Take 1 tablet (20 mg total) by mouth 2 (two) times daily. 60 tablet 5   fluticasone (FLONASE) 50 MCG/ACT nasal spray Place 1 spray into both nostrils 2 (two) times daily as needed for allergies or rhinitis. 16 g 5    hydrochlorothiazide (MICROZIDE) 12.5 MG capsule TAKE 1 CAPSULE(12.5 MG) BY MOUTH DAILY 90 capsule 3   hydrOXYzine (VISTARIL) 25 MG capsule Take 1 capsule (25 mg total) by mouth every 8 (eight) hours as needed. 30 capsule 6   ipratropium-albuterol (DUONEB) 0.5-2.5 (3) MG/3ML SOLN Take 3 mLs by nebulization every 6 (six) hours as needed. 60 mL 0   irbesartan (AVAPRO) 75 MG tablet Take 1 tablet (75 mg total) by mouth daily. 90 tablet 3   levocetirizine (XYZAL) 5 MG tablet Take 1  tablet (5 mg total) by mouth every evening. 30 tablet 5   montelukast (SINGULAIR) 10 MG tablet TAKE 1 TABLET(10 MG) BY MOUTH AT BEDTIME 30 tablet 2   ondansetron (ZOFRAN ODT) 4 MG disintegrating tablet Take 1 tablet (4 mg total) by mouth every 8 (eight) hours as needed for nausea or vomiting. 20 tablet 0   pantoprazole (PROTONIX) 40 MG tablet TAKE 1 TABLET BY MOUTH EVERY DAY 90 tablet 1   potassium chloride (KLOR-CON) 10 MEQ tablet Take 1 tablet (10 mEq total) by mouth daily. 90 tablet 3   pravastatin (PRAVACHOL) 40 MG tablet Take 40 mg by mouth daily as needed.     triamcinolone ointment (KENALOG) 0.1 % Apply 1 application topically 2 (two) times daily. 454 g 1   UNKNOWN TO PATIENT Weekly allergy shots     vitamin C (ASCORBIC ACID) 500 MG tablet Take 500 mg by mouth daily.      Current Facility-Administered Medications on File Prior to Visit  Medication Dose Route Frequency Provider Last Rate Last Admin   dupilumab (DUPIXENT) prefilled syringe 300 mg  300 mg Subcutaneous Q14 Days Valentina Shaggy, MD   300 mg at 10/04/21 0921        ROS:  All others reviewed and negative.  Objective        PE:  BP 120/80   Pulse 83   Resp 18   Ht 5' 1.5" (1.562 m)   Wt 152 lb 9.6 oz (69.2 kg)   SpO2 97%   BMI 28.37 kg/m                 Constitutional: Pt appears in NAD               HENT: Head: NCAT.                Right Ear: External ear normal.                 Left Ear: External ear normal.                Eyes: .  Pupils are equal, round, and reactive to light. Conjunctivae and EOM are normal               Nose: without d/c or deformity               Neck: Neck supple. Gross normal ROM               Cardiovascular: Normal rate and regular rhythm.                 Pulmonary/Chest: Effort normal and breath sounds without rales but with mild audible wheezing on history taking and auscultataion, no accessory muscle use.                Abd:  Soft, NT, ND, + BS, no organomegaly               Neurological: Pt is alert. At baseline orientation, motor grossly intact               Skin: Skin is warm. No rashes, no other new lesions, LE edema - none               Lumbar spine nontender in the midline; has mild to mod spasm tender to left lumbar paravertebral area               Psychiatric: Pt behavior is normal without agitation  Micro: none  Cardiac tracings I have personally interpreted today:  none  Pertinent Radiological findings (summarize): none   Lab Results  Component Value Date   WBC 18.7 Repeated and verified X2. (HH) 07/25/2021   HGB 12.3 07/25/2021   HCT 37.2 07/25/2021   PLT 385.0 07/25/2021   GLUCOSE 75 06/15/2021   CHOL 142 04/11/2021   TRIG 47.0 04/11/2021   HDL 64.30 04/11/2021   LDLCALC 69 04/11/2021   ALT 18 06/15/2021   AST 18 06/15/2021   NA 139 06/15/2021   K 4.2 06/15/2021   CL 101 06/15/2021   CREATININE 0.66 06/15/2021   BUN 14 06/15/2021   CO2 30 06/15/2021   TSH 0.47 04/11/2021   HGBA1C 6.2 04/11/2021   MICROALBUR 0.9 04/11/2021   Assessment/Plan:  SHAELA BOER is a 60 y.o. Black or African American [2] female with  has a past medical history of ALLERGIC RHINITIS (05/12/2009), Allergy, Anemia, iron deficiency (12/22/2014), Anginal pain ([place]), Anxiety, Arthritis, Asthma, ASTHMA (05/12/2009), Bipolar disorder ([place]), COPD (08/24/2009), COPD (chronic obstructive pulmonary disease) ([place]), Depression, Dyspnea, Fibromyalgia (05/14/2014), GERD (gastroesophageal reflux  disease), Headache, History of kidney stones, Hyperlipidemia (04/20/2017), HYPERTENSION (05/12/2009), Kidney stones, Peripheral vascular disease ([place]), Pneumonia, Prediabetes (02/23/2014), Seizure ([place]), Seizure ([place]), Stroke ([place]) (11/2020), and Urticaria.  URI (upper respiratory infection) Mild to mod, for antibx course,  to f/u any worsening symptoms or concerns  Asthma exacerbation Mild to mod, decliens cxr, for depomedrol im 80, predpac asd, cough med prn, inhaler refills and encouaged compliance,  to f/u any worsening symptoms or concerns  Prediabetes Lab Results  Component Value Date   HGBA1C 6.2 04/11/2021   Stable, pt to continue current medical treatment  - diet   Essential (primary) hypertension BP Readings from Last 3 Encounters:  10/11/21 120/80  09/21/21 114/74  09/05/21 120/84   Stable, pt to continue medical treatment hct, avapro   Low back pain C.w msk spasm  - for flexeril restart prn, also check ua  Followup: Return in about 6 months (around 04/11/2022).  Cathlean Cower, MD 10/11/2021 9:10 PM [facility]

## 2021-10-11 NOTE — Assessment & Plan Note (Signed)
Lab Results  Component Value Date   HGBA1C 6.2 04/11/2021   Stable, pt to continue current medical treatment  - diet

## 2021-10-11 NOTE — Assessment & Plan Note (Signed)
C.w msk spasm  - for flexeril restart prn, also check ua

## 2021-10-11 NOTE — Assessment & Plan Note (Signed)
BP Readings from Last 3 Encounters:  10/11/21 120/80  09/21/21 114/74  09/05/21 120/84   Stable, pt to continue medical treatment hct, avapro

## 2021-10-11 NOTE — Assessment & Plan Note (Signed)
Mild to mod, for antibx course,  to f/u any worsening symptoms or concerns 

## 2021-10-11 NOTE — Assessment & Plan Note (Signed)
Mild to mod, decliens cxr, for depomedrol im 80, predpac asd, cough med prn, inhaler refills and encouaged compliance,  to f/u any worsening symptoms or concerns

## 2021-10-12 ENCOUNTER — Ambulatory Visit (INDEPENDENT_AMBULATORY_CARE_PROVIDER_SITE_OTHER): Payer: Medicare HMO | Admitting: *Deleted

## 2021-10-12 DIAGNOSIS — J309 Allergic rhinitis, unspecified: Secondary | ICD-10-CM | POA: Diagnosis not present

## 2021-10-18 ENCOUNTER — Other Ambulatory Visit: Payer: Self-pay

## 2021-10-18 ENCOUNTER — Ambulatory Visit (INDEPENDENT_AMBULATORY_CARE_PROVIDER_SITE_OTHER): Payer: Medicare HMO

## 2021-10-18 ENCOUNTER — Encounter: Payer: Self-pay | Admitting: Podiatry

## 2021-10-18 ENCOUNTER — Ambulatory Visit (INDEPENDENT_AMBULATORY_CARE_PROVIDER_SITE_OTHER): Payer: Medicare HMO | Admitting: Podiatry

## 2021-10-18 DIAGNOSIS — J4550 Severe persistent asthma, uncomplicated: Secondary | ICD-10-CM | POA: Diagnosis not present

## 2021-10-18 DIAGNOSIS — M722 Plantar fascial fibromatosis: Secondary | ICD-10-CM

## 2021-10-18 DIAGNOSIS — M6740 Ganglion, unspecified site: Secondary | ICD-10-CM | POA: Diagnosis not present

## 2021-10-18 DIAGNOSIS — M79671 Pain in right foot: Secondary | ICD-10-CM

## 2021-10-18 DIAGNOSIS — M79672 Pain in left foot: Secondary | ICD-10-CM

## 2021-10-18 MED ORDER — TRIAMCINOLONE ACETONIDE 10 MG/ML IJ SUSP
20.0000 mg | Freq: Once | INTRAMUSCULAR | Status: AC
  Administered 2021-10-18: 20 mg

## 2021-10-18 NOTE — Progress Notes (Signed)
Subjective:   Patient ID: [name], female   DOB: 60 y.o.   MRN: [id number]   HPI Patient presents stating that she has had trouble with both of her heels and that it is worse there she gets some pain in her arch and forefoot but the heels is the worse and that she has a small knot on her left foot that she wanted checked that its not growing.  Patient does not smoke and tries to be active   Review of Systems  All other systems reviewed and are negative.      Objective:  Physical Exam Vitals and nursing note reviewed.  Constitutional:      Appearance: She is well-developed.  Pulmonary:     Effort: Pulmonary effort is normal.  Musculoskeletal:        General: Normal range of motion.  Skin:    General: Skin is warm.  Neurological:     Mental Status: She is alert.    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  Can utilize fascial braces will be seen back to recheck  X-rays indicate there is spur formation no indication stress fracture or arthritis

## 2021-10-24 ENCOUNTER — Ambulatory Visit (INDEPENDENT_AMBULATORY_CARE_PROVIDER_SITE_OTHER): Payer: Medicare HMO | Admitting: *Deleted

## 2021-10-24 DIAGNOSIS — J309 Allergic rhinitis, unspecified: Secondary | ICD-10-CM

## 2021-11-01 ENCOUNTER — Other Ambulatory Visit: Payer: Self-pay

## 2021-11-01 ENCOUNTER — Ambulatory Visit (INDEPENDENT_AMBULATORY_CARE_PROVIDER_SITE_OTHER): Payer: Medicare HMO

## 2021-11-01 DIAGNOSIS — J4550 Severe persistent asthma, uncomplicated: Secondary | ICD-10-CM | POA: Diagnosis not present

## 2021-11-06 ENCOUNTER — Telehealth: Payer: Self-pay

## 2021-11-06 ENCOUNTER — Other Ambulatory Visit: Payer: Self-pay | Admitting: Internal Medicine

## 2021-11-06 NOTE — Telephone Encounter (Signed)
Please refill as per office routine med refill policy (all routine meds to be refilled for 3 mo or monthly (per pt preference) up to one year from last visit, then month to month grace period for 3 mo, then further med refills will have to be denied) ? ?

## 2021-11-06 NOTE — Telephone Encounter (Signed)
Spoke with patient, informed her that we have 2 samples up front for pick up. Patient was very appreciative for the samples as she gets nervous if she runs out and not able to get it due to insurance. Patient will be by today 11/06/2021 to pick up.

## 2021-11-06 NOTE — Telephone Encounter (Signed)
Patient called to see if she can have a Sample of Breztri?  Patient states she doesn't have money to fill her inhaler this month & doesn't want to go through the holiday/weekend without her inhaler.  Please Advise.

## 2021-11-15 ENCOUNTER — Ambulatory Visit (INDEPENDENT_AMBULATORY_CARE_PROVIDER_SITE_OTHER): Payer: Medicare HMO

## 2021-11-15 ENCOUNTER — Other Ambulatory Visit: Payer: Self-pay

## 2021-11-15 DIAGNOSIS — J4550 Severe persistent asthma, uncomplicated: Secondary | ICD-10-CM | POA: Diagnosis not present

## 2021-11-23 ENCOUNTER — Other Ambulatory Visit: Payer: Self-pay

## 2021-11-23 ENCOUNTER — Ambulatory Visit (INDEPENDENT_AMBULATORY_CARE_PROVIDER_SITE_OTHER): Payer: Medicare HMO

## 2021-11-23 DIAGNOSIS — J309 Allergic rhinitis, unspecified: Secondary | ICD-10-CM

## 2021-11-23 DIAGNOSIS — Z0000 Encounter for general adult medical examination without abnormal findings: Secondary | ICD-10-CM
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
## 2021-11-23 NOTE — Patient Instructions (Signed)
[name] , Thank you for taking time to come for your Medicare Wellness Visit. I appreciate your ongoing commitment to your health goals. Please review the following plan we discussed and let me know if I can assist you in the future.   Screening recommendations/referrals: Colonoscopy: 06/09/2021; due every 5 years Mammogram: 06/02/2021; due every 1-2 years Bone Density: 02/11/2018; due every 5 years Recommended yearly ophthalmology/optometry visit for glaucoma screening and checkup Recommended yearly dental visit for hygiene and checkup  Vaccinations: Influenza vaccine: due Fall/Winter 2022 Pneumococcal vaccine: 12/02/2015, 06/15/2021 Tdap vaccine: 04/22/2014; due every 10 years Shingles vaccine: never done; check with local pharmacy for vaccine. (Information in mailed packet)  Covid-19: 02/24/2020, 03/16/2020, 10/29/2020  Advanced directives: Advance directive discussed with you today. I have provided a copy for you to complete at home and have notarized. Once this is complete please bring a copy in to our office so we can scan it into your chart.  Conditions/risks identified: Yes; Reviewed health maintenance screenings with patient today and relevant education, vaccines, and/or referrals were provided. Please continue to do your personal lifestyle choices by: daily care of teeth and gums, regular physical activity (goal should be 5 days a week for 30 minutes), eat a healthy diet, avoid tobacco and drug use, limiting any alcohol intake, taking a low-dose aspirin (if not allergic or have been advised by your provider otherwise) and taking vitamins and minerals as recommended by your provider. Continue doing brain stimulating activities (puzzles, reading, adult coloring books, staying active) to keep memory sharp. Continue to eat heart healthy diet (full of fruits, vegetables, whole grains, lean protein, water--limit salt, fat, and sugar intake) and increase physical activity as tolerated.  Next  appointment: Please schedule your next Medicare Wellness Visit with your Nurse Health Advisor in 1 year by calling [phone].  Preventive Care 40-64 Years, Female Preventive care refers to lifestyle choices and visits with your health care provider that can promote health and wellness. What does preventive care include? A yearly physical exam. This is also called an annual well check. Dental exams once or twice a year. Routine eye exams. Ask your health care provider how often you should have your eyes checked. Personal lifestyle choices, including: Daily care of your teeth and gums. Regular physical activity. Eating a healthy diet. Avoiding tobacco and drug use. Limiting alcohol use. Practicing safe sex. Taking low-dose aspirin daily starting at age 35. Taking vitamin and mineral supplements as recommended by your health care provider. What happens during an annual well check? The services and screenings done by your health care provider during your annual well check will depend on your age, overall health, lifestyle risk factors, and family history of disease. Counseling  Your health care provider may ask you questions about your: Alcohol use. Tobacco use. Drug use. Emotional well-being. Home and relationship well-being. Sexual activity. Eating habits. Work and work Statistician. Method of birth control. Menstrual cycle. Pregnancy history. Screening  You may have the following tests or measurements: Height, weight, and BMI. Blood pressure. Lipid and cholesterol levels. These may be checked every 5 years, or more frequently if you are over 53 years old. Skin check. Lung cancer screening. You may have this screening every year starting at age 28 if you have a 30-pack-year history of smoking and currently smoke or have quit within the past 15 years. Fecal occult blood test (FOBT) of the stool. You may have this test every year starting at age 34. Flexible sigmoidoscopy or  colonoscopy. You  may have a sigmoidoscopy every 5 years or a colonoscopy every 10 years starting at age 40. Hepatitis C blood test. Hepatitis B blood test. Sexually transmitted disease (STD) testing. Diabetes screening. This is done by checking your blood sugar (glucose) after you have not eaten for a while (fasting). You may have this done every 1-3 years. Mammogram. This may be done every 1-2 years. Talk to your health care provider about when you should start having regular mammograms. This may depend on whether you have a family history of breast cancer. BRCA-related cancer screening. This may be done if you have a family history of breast, ovarian, tubal, or peritoneal cancers. Pelvic exam and Pap test. This may be done every 3 years starting at age 52. Starting at age 72, this may be done every 5 years if you have a Pap test in combination with an HPV test. Bone density scan. This is done to screen for osteoporosis. You may have this scan if you are at high risk for osteoporosis. Discuss your test results, treatment options, and if necessary, the need for more tests with your health care provider. Vaccines  Your health care provider may recommend certain vaccines, such as: Influenza vaccine. This is recommended every year. Tetanus, diphtheria, and acellular pertussis (Tdap, Td) vaccine. You may need a Td booster every 10 years. Zoster vaccine. You may need this after age 25. Pneumococcal 13-valent conjugate (PCV13) vaccine. You may need this if you have certain conditions and were not previously vaccinated. Pneumococcal polysaccharide (PPSV23) vaccine. You may need one or two doses if you smoke cigarettes or if you have certain conditions. Talk to your health care provider about which screenings and vaccines you need and how often you need them. This information is not intended to replace advice given to you by your health care provider. Make sure you discuss any questions you have with your  health care provider. Document Released: 12/30/2015 Document Revised: 08/22/2016 Document Reviewed: 10/04/2015 Elsevier Interactive Patient Education  2017 [employer] Prevention in the Home Falls can cause injuries. They can happen to people of all ages. There are many things you can do to make your home safe and to help prevent falls. What can I do on the outside of my home? Regularly fix the edges of walkways and driveways and fix any cracks. Remove anything that might make you trip as you walk through a door, such as a raised step or threshold. Trim any bushes or trees on the path to your home. Use bright outdoor lighting. Clear any walking paths of anything that might make someone trip, such as rocks or tools. Regularly check to see if handrails are loose or broken. Make sure that both sides of any steps have handrails. Any raised decks and porches should have guardrails on the edges. Have any leaves, snow, or ice cleared regularly. Use sand or salt on walking paths during winter. Clean up any spills in your garage right away. This includes oil or grease spills. What can I do in the bathroom? Use night lights. Install grab bars by the toilet and in the tub and shower. Do not use towel bars as grab bars. Use non-skid mats or decals in the tub or shower. If you need to sit down in the shower, use a plastic, non-slip stool. Keep the floor dry. Clean up any water that spills on the floor as soon as it happens. Remove soap buildup in the tub or shower regularly. Attach  bath mats securely with double-sided non-slip rug tape. Do not have throw rugs and other things on the floor that can make you trip. What can I do in the bedroom? Use night lights. Make sure that you have a light by your bed that is easy to reach. Do not use any sheets or blankets that are too big for your bed. They should not hang down onto the floor. Have a firm chair that has side arms. You can use this  for support while you get dressed. Do not have throw rugs and other things on the floor that can make you trip. What can I do in the kitchen? Clean up any spills right away. Avoid walking on wet floors. Keep items that you use a lot in easy-to-reach places. If you need to reach something above you, use a strong step stool that has a grab bar. Keep electrical cords out of the way. Do not use floor polish or wax that makes floors slippery. If you must use wax, use non-skid floor wax. Do not have throw rugs and other things on the floor that can make you trip. What can I do with my stairs? Do not leave any items on the stairs. Make sure that there are handrails on both sides of the stairs and use them. Fix handrails that are broken or loose. Make sure that handrails are as long as the stairways. Check any carpeting to make sure that it is firmly attached to the stairs. Fix any carpet that is loose or worn. Avoid having throw rugs at the top or bottom of the stairs. If you do have throw rugs, attach them to the floor with carpet tape. Make sure that you have a light switch at the top of the stairs and the bottom of the stairs. If you do not have them, ask someone to add them for you. What else can I do to help prevent falls? Wear shoes that: Do not have high heels. Have rubber bottoms. Are comfortable and fit you well. Are closed at the toe. Do not wear sandals. If you use a stepladder: Make sure that it is fully opened. Do not climb a closed stepladder. Make sure that both sides of the stepladder are locked into place. Ask someone to hold it for you, if possible. Clearly mark and make sure that you can see: Any grab bars or handrails. First and last steps. Where the edge of each step is. Use tools that help you move around (mobility aids) if they are needed. These include: Canes. Walkers. Scooters. Crutches. Turn on the lights when you go into a dark area. Replace any light bulbs as  soon as they burn out. Set up your furniture so you have a clear path. Avoid moving your furniture around. If any of your floors are uneven, fix them. If there are any pets around you, be aware of where they are. Review your medicines with your doctor. Some medicines can make you feel dizzy. This can increase your chance of falling. Ask your doctor what other things that you can do to help prevent falls. This information is not intended to replace advice given to you by your health care provider. Make sure you discuss any questions you have with your health care provider. Document Released: 09/29/2009 Document Revised: 05/10/2016 Document Reviewed: 01/07/2015 Elsevier Interactive Patient Education  2017 [employer].

## 2021-11-24 ENCOUNTER — Ambulatory Visit (INDEPENDENT_AMBULATORY_CARE_PROVIDER_SITE_OTHER): Payer: Medicare HMO | Admitting: *Deleted

## 2021-11-24 DIAGNOSIS — Z23 Encounter for immunization: Secondary | ICD-10-CM

## 2021-11-28 NOTE — Progress Notes (Signed)
Exp 11/30/22

## 2021-11-29 ENCOUNTER — Ambulatory Visit (INDEPENDENT_AMBULATORY_CARE_PROVIDER_SITE_OTHER): Payer: Medicare HMO

## 2021-11-29 ENCOUNTER — Ambulatory Visit: Payer: Medicare HMO | Admitting: Internal Medicine

## 2021-11-29 ENCOUNTER — Other Ambulatory Visit: Payer: Self-pay

## 2021-11-29 ENCOUNTER — Ambulatory Visit (HOSPITAL_COMMUNITY): Payer: Medicare HMO | Admitting: Psychiatry

## 2021-11-29 DIAGNOSIS — J4550 Severe persistent asthma, uncomplicated: Secondary | ICD-10-CM

## 2021-11-30 DIAGNOSIS — J3089 Other allergic rhinitis: Secondary | ICD-10-CM | POA: Diagnosis not present

## 2021-12-06 ENCOUNTER — Ambulatory Visit (INDEPENDENT_AMBULATORY_CARE_PROVIDER_SITE_OTHER): Payer: Medicare HMO

## 2021-12-06 ENCOUNTER — Other Ambulatory Visit: Payer: Self-pay | Admitting: Emergency Medicine

## 2021-12-06 DIAGNOSIS — J309 Allergic rhinitis, unspecified: Secondary | ICD-10-CM

## 2021-12-13 ENCOUNTER — Other Ambulatory Visit: Payer: Self-pay

## 2021-12-13 ENCOUNTER — Ambulatory Visit (INDEPENDENT_AMBULATORY_CARE_PROVIDER_SITE_OTHER): Payer: Medicare HMO

## 2021-12-13 DIAGNOSIS — J4550 Severe persistent asthma, uncomplicated: Secondary | ICD-10-CM

## 2021-12-19 ENCOUNTER — Ambulatory Visit (HOSPITAL_COMMUNITY): Payer: Medicare HMO | Admitting: Psychiatry

## 2021-12-22 ENCOUNTER — Ambulatory Visit (INDEPENDENT_AMBULATORY_CARE_PROVIDER_SITE_OTHER): Payer: Medicare HMO

## 2021-12-22 DIAGNOSIS — J309 Allergic rhinitis, unspecified: Secondary | ICD-10-CM | POA: Diagnosis not present

## 2021-12-27 ENCOUNTER — Ambulatory Visit (INDEPENDENT_AMBULATORY_CARE_PROVIDER_SITE_OTHER): Payer: Medicare HMO

## 2021-12-27 ENCOUNTER — Other Ambulatory Visit: Payer: Self-pay

## 2021-12-27 DIAGNOSIS — J4550 Severe persistent asthma, uncomplicated: Secondary | ICD-10-CM | POA: Diagnosis not present

## 2022-01-03 ENCOUNTER — Ambulatory Visit (INDEPENDENT_AMBULATORY_CARE_PROVIDER_SITE_OTHER): Payer: Medicare HMO

## 2022-01-03 DIAGNOSIS — J309 Allergic rhinitis, unspecified: Secondary | ICD-10-CM

## 2022-01-08 ENCOUNTER — Ambulatory Visit
Admission: EM | Admit: 2022-01-08 | Discharge: 2022-01-08 | Discharge status: Against Medical Advice | Payer: Medicare HMO

## 2022-01-08 ENCOUNTER — Other Ambulatory Visit: Payer: Self-pay

## 2022-01-08 ENCOUNTER — Telehealth: Payer: Self-pay | Admitting: Internal Medicine

## 2022-01-08 NOTE — Telephone Encounter (Signed)
Patient requesting a chest xray  Patient states she "has a lot going on in her chest" patient would not go into further details  Offered patient an appt for this coming Thursday 1-26, patient declined stating "just send the message"  Please advise

## 2022-01-08 NOTE — Telephone Encounter (Signed)
Advised patient that we are not able to order chest x-ray without her being seen. Patient states that she made an appointment with her allergy and asthma doctor and will have testing with them. Advised patient to call and set up an appointment with our office if issues persist.

## 2022-01-09 ENCOUNTER — Emergency Department (HOSPITAL_COMMUNITY): Payer: Medicare HMO

## 2022-01-09 ENCOUNTER — Encounter (HOSPITAL_COMMUNITY): Payer: Self-pay | Admitting: Emergency Medicine

## 2022-01-09 ENCOUNTER — Emergency Department (HOSPITAL_COMMUNITY)
Admission: EM | Admit: 2022-01-09 | Discharge: 2022-01-09 | Disposition: A | Discharge status: Home / Self Care | Payer: Medicare HMO | Attending: Emergency Medicine | Admitting: Emergency Medicine

## 2022-01-09 ENCOUNTER — Ambulatory Visit (INDEPENDENT_AMBULATORY_CARE_PROVIDER_SITE_OTHER): Payer: Medicare HMO | Admitting: Allergy & Immunology

## 2022-01-09 ENCOUNTER — Encounter: Payer: Self-pay | Admitting: Allergy & Immunology

## 2022-01-09 VITALS — BP 104/64 | HR 89 | Temp 98.5°F | Resp 20 | Ht 63.0 in | Wt 153.8 lb

## 2022-01-09 DIAGNOSIS — R0789 Other chest pain: Secondary | ICD-10-CM | POA: Insufficient documentation

## 2022-01-09 DIAGNOSIS — J4550 Severe persistent asthma, uncomplicated: Secondary | ICD-10-CM

## 2022-01-09 DIAGNOSIS — N9489 Other specified conditions associated with female genital organs and menstrual cycle: Secondary | ICD-10-CM | POA: Insufficient documentation

## 2022-01-09 DIAGNOSIS — R079 Chest pain, unspecified: Secondary | ICD-10-CM | POA: Diagnosis not present

## 2022-01-09 LAB — BASIC METABOLIC PANEL
Anion gap: 9 (ref 5–15)
BUN: 8 mg/dL (ref 6–20)
CO2: 29 mmol/L (ref 22–32)
Calcium: 9.5 mg/dL (ref 8.9–10.3)
Chloride: 101 mmol/L (ref 98–111)
Creatinine, Ser: 0.8 mg/dL (ref 0.44–1.00)
GFR, Estimated: >60 mL/min (ref >60)
Glucose, Bld: 87 mg/dL (ref 70–99)
Potassium: 3.9 mmol/L (ref 3.5–5.1)
Sodium: 139 mmol/L (ref 135–145)

## 2022-01-09 LAB — CBC
HCT: 38.2 % (ref 36.0–46.0)
Hemoglobin: 12.8 g/dL (ref 12.0–15.0)
MCH: 32.5 pg (ref 26.0–34.0)
MCHC: 33.5 g/dL (ref 30.0–36.0)
MCV: 97 fL (ref 80.0–100.0)
Platelets: 386 10*3/uL (ref 150–400)
RBC: 3.94 MIL/uL (ref 3.87–5.11)
RDW: 13.6 % (ref 11.5–15.5)
WBC: 11.3 10*3/uL — ABNORMAL HIGH (ref 4.0–10.5)
nRBC: 0 % (ref 0.0–0.2)

## 2022-01-09 LAB — I-STAT BETA HCG BLOOD, ED (MC, WL, AP ONLY): I-stat hCG, quantitative: <5 m[IU]/mL (ref <5)

## 2022-01-09 LAB — HEPATIC FUNCTION PANEL
ALT: 16 U/L (ref 0–44)
AST: 19 U/L (ref 15–41)
Albumin: 4 g/dL (ref 3.5–5.0)
Alkaline Phosphatase: 77 U/L (ref 38–126)
Bilirubin, Direct: <0.1 mg/dL (ref 0.0–0.2)
Total Bilirubin: 0.4 mg/dL (ref 0.3–1.2)
Total Protein: 7 g/dL (ref 6.5–8.1)

## 2022-01-09 LAB — LIPASE, BLOOD: Lipase: 28 U/L (ref 11–51)

## 2022-01-09 LAB — TROPONIN I (HIGH SENSITIVITY): Troponin I (High Sensitivity): 3 ng/L (ref <18)

## 2022-01-09 MED ORDER — ATROVENT HFA 17 MCG/ACT IN AERS: 2.0000 | INHALATION_SPRAY | Freq: Four times a day (QID) | RESPIRATORY_TRACT | 12 refills | Status: DC | PRN

## 2022-01-09 MED ORDER — LIDOCAINE 5 % EX PTCH: 1.0000 | MEDICATED_PATCH | CUTANEOUS | 0 refills | Status: DC

## 2022-01-09 NOTE — ED Provider Triage Note (Signed)
Emergency Medicine Provider Triage Evaluation Note  [name] , a 61 y.o. female  was evaluated in triage.  Pt complains of intermittent right-sided chest pain x1 week.  Patient states she feels like she "pulled something".  Denies associated shortness of breath.  Pain radiates to back.  Patient admits to nausea.  No cardiac history.  No history of blood clots.  Denies lower extremity edema.  Patient sent by MD due to concerns about MI, PE, pneumothorax  Review of Systems  Positive: CP Negative: SOB  Physical Exam  BP (!) 132/97    Pulse 83    Temp 98 F (36.7 C) (Oral)    Resp 20    SpO2 99%  Gen:   Awake, no distress   Resp:  Normal effort  MSK:   Moves extremities without difficulty  Other:  Reproducible right sided CP, RUQ tenderness  Medical Decision Making  Medically screening exam initiated at 12:13 PM.  Appropriate orders placed.  [name] was informed that the remainder of the evaluation will be completed by another provider, this initial triage assessment does not replace that evaluation, and the importance of remaining in the ED until their evaluation is complete.  Cardiac labs to rule out ACS Added abdominal labs to rule out gallbladder etiology given RUQ tenderness   Suzy Bouchard, PA-C 01/09/22 1215

## 2022-01-09 NOTE — Patient Instructions (Addendum)
1. Asthma-COPD overlap syndrome - Lung testing not done today (in case there is something more serious going on, I do not want to mess that up). - I would like you to go to the ED so that they can evaluate this chest pain.  - I could order labs but they would get them quicker in the ED.  - Daily controller medication(s): Breztri 116mcg two puffs twice daily with spacer + Dupixent every two weeks - Prior to physical activity: Proventil (ORANAGE INHALER) 2 puffs or Ventolin (GRAY INHALER) 2 puffs 10-15 minutes before physical activity. - Rescue medications: Proventil (ORANAGE INHALER) 2 puffs or Ventolin (GRAY INHALER) 2 puffs AND 2 puffs Atrovent (GREEN INHALER) every 4-6 hours as needed - Asthma control goals:  * Full participation in all desired activities (may need albuterol before activity) * Albuterol use two time or less a week on average (not counting use with activity) * Cough interfering with sleep two time or less a month * Oral steroids no more than once a year * No hospitalizations   2. Chronic urticaria - well controlled  - Continue with Xyzal twice daily. - Continue with famotidine twice daily. - Continue with Doxepin 25mg  at night to help with itching.   3. 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 - Continue with allergy shots.   4. Return in about 4 weeks (around 02/06/2022).    Please inform us of any Emergency Department visits, hospitalizations, or changes in symptoms. Call us before going to the ED for breathing or allergy symptoms since we might be able to fit you in for a sick visit. Feel free to contact us anytime with any questions, problems, or concerns.  It was a pleasure to see you again today!  Websites that have reliable patient information:  1. American Academy of Asthma, Allergy, and Immunology: www.aaaai.org 2. Food Allergy Research and Education (FARE): foodallergy.org 3. Mothers  of Asthmatics: http://www.asthmacommunitynetwork.org 4. American College of Allergy, Asthma, and Immunology: www.acaai.org   COVID-19 Vaccine Information can be found at: ShippingScam.co.uk For questions related to vaccine distribution or appointments, please email [email or web address] or call [phone].     Like [place] on [place] and Instagram for our latest updates!   .  They are in [place], [place],.    Make sure you are registered to vote! If you have moved or changed any of your contact information, you will need to get this updated before voting!  In some cases, you MAY be able to register to vote online: [email or web address]

## 2022-01-09 NOTE — Progress Notes (Signed)
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 chest CT next visit)    Perennial allergic rhinitis (dust mites) - on allergen immunotherapy   Chronic urticaria - improved with antihistamines   Recurrent infections - with largely normal workup aside from non-protective Pneumococcal titers and non-protective Diptheria titers   Chest pain - concern for PE versus MI or pneumothorax (although the 3 week time frame does not make complete sense at all)  Plan/Recommendations:   1. Asthma-COPD overlap syndrome - Lung testing not done today (in case there is something more serious going on, I do not want to mess that up). - I would like you to go to the ED so that they can evaluate this chest pain.  - I could order labs but they would get them quicker in the ED.  - Daily controller medication(s): Breztri 163mcg two puffs twice daily with spacer + Dupixent every two weeks - Prior to physical activity: Proventil (ORANAGE INHALER) 2 puffs or Ventolin (GRAY INHALER) 2 puffs 10-15 minutes before physical activity. - Rescue medications: Proventil (ORANAGE INHALER) 2 puffs or Ventolin (GRAY INHALER) 2 puffs AND 2 puffs Atrovent (GREEN INHALER) every 4-6 hours as needed - Asthma control goals:  * Full participation in all desired activities (may need albuterol before activity) * Albuterol use two time or less a week on average (not counting use with activity) * Cough interfering with sleep two time or less a month * Oral steroids no more than once a year * No hospitalizations   2. Chronic urticaria - well controlled  - Continue with Xyzal twice daily. - Continue with famotidine twice daily. - Continue with Doxepin 25mg  at night to help with itching.   3. 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 - Continue with allergy shots.    4. Return in about 4 weeks (around 02/06/2022).   Subjective:   [name] is a 61 y.o. female presenting today for follow up of  Chief Complaint  Patient presents with   Asthma    Pressure on her right side of her chest in the front and back. Thought it was reflux so she took pepcid but it did not help. Chest is heavy.   Allergic Rhinitis     Swelling under the eyes. Always have puffy eyes   Fatigue    Goes to bed at 4pm and sleeps through the night.    [name] has a history of the following: Patient Active Problem List   Diagnosis Date Noted   Asthma exacerbation 10/11/2021   Low back pain 10/11/2021   COPD exacerbation ([place]) 07/17/2021   Abnormal CXR 07/17/2021   Aortic atherosclerosis ([place]) 07/05/2021   URI (upper respiratory infection) 07/05/2021   Arthrodesis status 06/20/2021   Drug allergy 05/14/2021   Cough 05/14/2021   Bilateral leg weakness 04/17/2021   Status post cervical spinal fusion 12/27/2020   History of stroke 12/11/2020   Medication management 10/21/2020   Body mass index (BMI) 26.0-26.9, adult 08/23/2020   Healthcare maintenance 08/11/2020   Cervical spondylosis with myelopathy and radiculopathy 07/28/2020   Preop exam for internal medicine 06/19/2020   Hypotension due to medication 06/11/2020   Status asthmaticus 06/09/2020   Essential (primary) hypertension 05/17/2020   Other spondylosis with myelopathy, cervical region 02/16/2020   Cervical spondylosis 02/02/2020   Radiculopathy, cervical region  02/02/2020   Spondylolysis, cervical region 02/02/2020   Neck pain 02/02/2020   Leg cramping 08/12/2019   Laryngopharyngeal reflux (LPR) 05/26/2019   Sinus pressure 05/26/2019   Hypophosphatemia 01/07/2019   Vertigo 09/23/2018   Dysfunction of left eustachian tube 09/23/2018   Gait disorder 04/21/2018   Leukocytosis 04/15/2018   Nausea & vomiting 04/15/2018   Seizure ([place])    Prolapsed cervical intervertebral disc  03/11/2018   Cervical radiculopathy 02/18/2018   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   Syncope 09/04/2017   Constipation 09/04/2017   Nonintractable headache 09/04/2017   Leg swelling 07/26/2017   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   Hyperlipidemia 04/20/2017   Pedal edema 04/19/2017   Bipolar I disorder ([place]) 09/19/2016   Facial pain 07/12/2016   Rash 06/14/2016   Migraine 01/25/2016   Idiopathic urticaria/pruritus 01/23/2016   Allergic rhinitis 01/23/2016   Oral candidiasis 01/23/2016   Greater trochanteric bursitis of both hips 09/08/2015   Chest pain 06/22/2015   Itching 06/22/2015   Bilateral knee pain 06/22/2015   Bilateral hip pain 06/22/2015   Anemia, iron deficiency 12/22/2014   Fatigue 12/21/2014   Intertrigo 08/07/2014   Dyspareunia 07/12/2014   Menopausal syndrome (hot flushes) 07/12/2014   History of tobacco abuse 07/12/2014   Dizziness 06/22/2014   Weakness 06/22/2014   Fibromyalgia 05/14/2014   Encounter for well adult exam with abnormal findings 04/22/2014   Recurrent boils 04/22/2014   Recurrent falls 04/22/2014   Peripheral vascular disease ([place])    Depression    Anxiety    GERD (gastroesophageal reflux disease)    Prediabetes 02/23/2014   Screening mammogram for high-risk patient 02/23/2014   Back pain 07/22/2013   COPD/asthma 08/24/2009   Headache(784.0) 08/24/2009    History obtained from: chart review and patient.  [name] is a 61 y.o. female presenting for a follow up visit.  She was last seen in September 2022.  At that time, her lung testing looks slightly worse.  We changed back to [place] and continue with Breztri 2 puffs every 12 hours.  For her urticaria, would continue with Xyzal twice daily as well as famotidine twice daily.  We also continue with doxepin at night.  We added on triamcinolone mixed with Eucerin twice daily.  For her rhinitis, we continue with Flonase and the allergen immunotherapy shots.  Since last visit, she has  mostly done well.   Asthma/Respiratory Symptom History: She has been having a lot of SOB and chest tightness for one weeks. She was eating wings  on January 4th and then her symptoms started. However, she has had some continued chest pain that has been constant for three weeks. She has been treating it with muscle relaxants and heating pads. She has not had a CXR during this time. She has not had a fever at all or a cough or rhinorrhea. This chest pain is on the right side and on the back as well. The pain again is rather constant. She reports that this is a 6/10 pain and she tends to have a higher pain threshold  She does not have any cardiac issues. She does not know when she last had a an EKG. She did try to get in to see Dr. Jenny Reichmann about this, but he was booked up. She was never told to go to the ED with her chest pain. She has never had any trauma to the chest at all. She has not had any fevers. She does have GERD and has been compliant with  her medications. She did try to use Tums when this first started but this was not successful. Likewise, albuterol use was not successful.   She remains on her Dupixent every two weeks and is going well with this. She did get her Dupixent today since she was here today.   Allergic Rhinitis Symptom History: She remains on the Flonase one spray per nostril up to twice daily. She also remains on her allergen immunotherapy. She has not needed antibiotics in quite some time.   Skin Symptom History: She remains on Dupixent every two weeks. She has been using regular white vinegar for her skin.   She has not been using any medicated ointments at all.   Otherwise, there have been no changes to her past medical history, surgical history, family history, or social history.    Review of Systems  Constitutional: Negative.  Negative for chills, fever, malaise/fatigue and weight loss.  HENT: Negative.  Negative for congestion, ear discharge and ear pain.   Eyes:  Negative  for pain, discharge and redness.  Respiratory:  Positive for shortness of breath. Negative for cough, sputum production and wheezing.        Positive for SOB Positive for tenderness on chest.  Cardiovascular: Negative.  Negative for chest pain and palpitations.  Gastrointestinal:  Negative for abdominal pain, constipation, diarrhea, heartburn, nausea and vomiting.  Skin: Negative.  Negative for itching and rash.  Neurological:  Negative for dizziness and headaches.  Endo/Heme/Allergies:  Negative for environmental allergies. Does not bruise/bleed easily.      Objective:   Blood pressure 104/64, pulse 89, temperature 98.5 F (36.9 C), temperature source Temporal, resp. rate 20, height 5\' 3"  (1.6 m), weight 153 lb 12.8 oz (69.8 kg), SpO2 98 %. Body mass index is 27.24 kg/m.   Physical Exam:  Physical Exam Vitals reviewed.  Constitutional:      Appearance: She is well-developed.  HENT:     Head: Normocephalic and atraumatic.     Right Ear: Tympanic membrane, ear canal and external ear normal.     Left Ear: Tympanic membrane, ear canal and external ear normal.     Nose: No nasal deformity, septal deviation, mucosal edema or rhinorrhea.     Right Turbinates: Not enlarged, swollen or pale.     Left Turbinates: Not enlarged, swollen or pale.     Right Sinus: No maxillary sinus tenderness or frontal sinus tenderness.     Left Sinus: No maxillary sinus tenderness or frontal sinus tenderness.     Mouth/Throat:     Mouth: Mucous membranes are not pale and not dry.     Pharynx: Uvula midline.  Eyes:     General: Lids are normal. No allergic shiner.       Right eye: No discharge.        Left eye: No discharge.     Conjunctiva/sclera: Conjunctivae normal.     Right eye: Right conjunctiva is not injected. No chemosis.    Left eye: Left conjunctiva is not injected. No chemosis.    Pupils: Pupils are equal, round, and reactive to light.  Cardiovascular:     Rate and Rhythm: Normal rate  and regular rhythm.     Heart sounds: Normal heart sounds.  Pulmonary:     Effort: Pulmonary effort is normal. No tachypnea, accessory muscle usage or respiratory distress.     Breath sounds: Normal breath sounds. No wheezing, rhonchi or rales.     Comments: Exquisitely tender with some guarding when I  listen to her anterior right superior lung field. There is decreased air movement in that area as well.  Chest:     Chest wall: No tenderness.  Lymphadenopathy:     Cervical: No cervical adenopathy.  Skin:    General: Skin is warm.     Capillary Refill: Capillary refill takes less than 2 seconds.     Coloration: Skin is not pale.     Findings: No abrasion, erythema, petechiae or rash. Rash is not papular, urticarial or vesicular.     Comments: Skin looks   Neurological:     Mental Status: She is alert.  Psychiatric:        Behavior: Behavior is cooperative.     Diagnostic studies: none          Salvatore Marvel, MD  [facility]

## 2022-01-09 NOTE — ED Provider Notes (Signed)
[facility] EMERGENCY DEPARTMENT Provider Note   CSN: [id number] Arrival date & time: 01/09/22  1141     History  Chief Complaint  Patient presents with   Chest Pain    [name] is a 61 y.o. female.  61 year old female presents with 1 week of right-sided chest pain.  Pain is sharp and worse with movement.  It starts at her right posterior shoulder then goes to her chest.  Patient has been using muscle actions with temporary relief.  No associated fever, cough, hemoptysis.  Denies any direct history of trauma.  Saw her allergist today and was sent to for further management.      Home Medications Prior to Admission medications   Medication Sig Start Date End Date Taking? Authorizing Provider  albuterol (PROVENTIL) (2.5 MG/3ML) 0.083% nebulizer solution Take 3 mLs (2.5 mg total) by nebulization every 6 (six) hours as needed for wheezing or shortness of breath. Reported on 06/04/2016 08/11/20   Lauraine Rinne, NP  albuterol (VENTOLIN HFA) 108 (90 Base) MCG/ACT inhaler Inhale 2 puffs into the lungs every 4 (four) hours as needed for wheezing or shortness of breath. 10/11/21   Biagio Borg, MD  Alcohol Swabs (B-D SINGLE USE SWABS REGULAR) PADS Use as directed twice per day E11.9 08/24/21   Biagio Borg, MD  atorvastatin (LIPITOR) 40 MG tablet TAKE 1 TABLET(40 MG) BY MOUTH DAILY 11/06/21   Biagio Borg, MD  Biotin 1000 MCG tablet Take 1,000 mcg by mouth daily.    [provider]  Blood Glucose Calibration (TRUE METRIX LEVEL 1) Low SOLN Use as directed once per day E11.9 08/24/21   Biagio Borg, MD  Budeson-Glycopyrrol-Formoterol (BREZTRI AEROSPHERE) 160-9-4.8 MCG/ACT AERO INHALE 2 PUFFS INTO THE LUNGS TWICE DAILY WITH SPACER 10/11/21   Biagio Borg, MD  carbamazepine (TEGRETOL) 200 MG tablet Take 2 tablets (400 mg total) by mouth 2 (two) times daily. 06/28/21   Plovsky, Berneta Sages, MD  cetirizine (ZYRTEC) 10 MG tablet Can take one tablet by mouth once daily  if needed for runny nose. 04/28/21   Dara Hoyer, FNP  Cholecalciferol (VITAMIN D) 125 MCG (5000 UT) CAPS Take 5,000 Units by mouth daily. Patient not taking: Reported on 01/09/2022    [provider]  cyclobenzaprine (FLEXERIL) 5 MG tablet TAKE 1 TABLET(5 MG) BY MOUTH THREE TIMES DAILY AS NEEDED FOR MUSCLE SPASMS 10/11/21   Biagio Borg, MD  diclofenac Sodium (VOLTAREN) 1 % GEL Apply 4 g topically 4 (four) times daily. Patient taking differently: Apply 4 g topically 4 (four) times daily as needed (pain). 06/04/20   Deno Etienne, DO  doxepin (SINEQUAN) 25 MG capsule TAKE 1 CAPSULE(25 MG) BY MOUTH AT BEDTIME AS NEEDED FOR ITCHING 08/10/21   Valentina Shaggy, MD  DUPIXENT 300 MG/2ML prefilled syringe Inject into the skin. 12/25/21   [provider]  estradiol (ESTRACE) 0.5 MG tablet Take 1 tablet (0.5 mg total) by mouth daily. 09/21/21   Princess Bruins, MD  famotidine (PEPCID) 20 MG tablet Take 1 tablet (20 mg total) by mouth 2 (two) times daily. 09/06/21   Valentina Shaggy, MD  fluticasone [facility]) 50 MCG/ACT nasal spray Place 1 spray into both nostrils 2 (two) times daily as needed for allergies or rhinitis. 09/06/21   Valentina Shaggy, MD  hydrochlorothiazide (MICROZIDE) 12.5 MG capsule TAKE 1 CAPSULE(12.5 MG) BY MOUTH DAILY 11/06/21   Biagio Borg, MD  hydrOXYzine (VISTARIL) 25 MG capsule Take 1  capsule (25 mg total) by mouth every 8 (eight) hours as needed. 06/28/21   Plovsky, Berneta Sages, MD  ipratropium (ATROVENT HFA) 17 MCG/ACT inhaler Inhale 2 puffs into the lungs every 6 (six) hours as needed for wheezing. 01/09/22   Valentina Shaggy, MD  ipratropium-albuterol (DUONEB) 0.5-2.5 (3) MG/3ML SOLN Take 3 mLs by nebulization every 6 (six) hours as needed. 08/21/20   Tasia Catchings, Amy V, PA-C  irbesartan (AVAPRO) 75 MG tablet TAKE 1 TABLET(75 MG) BY MOUTH DAILY 11/06/21   Biagio Borg, MD  levocetirizine (XYZAL) 5 MG tablet Take 1 tablet (5 mg total) by mouth every evening. 09/06/21    Valentina Shaggy, MD  montelukast (SINGULAIR) 10 MG tablet TAKE 1 TABLET(10 MG) BY MOUTH AT BEDTIME 12/06/21   Collene Gobble, MD  ondansetron (ZOFRAN ODT) 4 MG disintegrating tablet Take 1 tablet (4 mg total) by mouth every 8 (eight) hours as needed for nausea or vomiting. 05/19/21   Valentina Shaggy, MD  pantoprazole (PROTONIX) 40 MG tablet TAKE 1 TABLET BY MOUTH EVERY DAY Patient not taking: Reported on 01/09/2022 05/24/21   Noralyn Pick, NP  potassium chloride (KLOR-CON) 10 MEQ tablet Take 1 tablet (10 mEq total) by mouth daily. Patient not taking: Reported on 01/09/2022 04/13/21   Biagio Borg, MD  pravastatin (PRAVACHOL) 40 MG tablet Take 40 mg by mouth daily as needed. 01/16/21   [provider]  promethazine-dextromethorphan (PROMETHAZINE-DM) 6.25-15 MG/5ML syrup Take 5 mLs by mouth 4 (four) times daily as needed for cough. 10/11/21   Biagio Borg, MD  triamcinolone ointment (KENALOG) 0.1 % Apply 1 application topically 2 (two) times daily. 09/06/21   Valentina Shaggy, MD  UNKNOWN TO PATIENT Weekly allergy shots    [provider]  vitamin C (ASCORBIC ACID) 500 MG tablet Take 500 mg by mouth daily.  Patient not taking: Reported on 01/09/2022    [provider]      Allergies    Sulfa antibiotics and Venofer [iron sucrose]    Review of Systems   Review of Systems  All other systems reviewed and are negative.  Physical Exam Updated Vital Signs BP 116/83 (BP Location: Right Arm)    Pulse 67    Temp 98 F (36.7 C) (Oral)    Resp 18    SpO2 100%  Physical Exam Vitals and nursing note reviewed.  Constitutional:      General: She is not in acute distress.    Appearance: Normal appearance. She is well-developed. She is not toxic-appearing.  HENT:     Head: Normocephalic and atraumatic.  Eyes:     General: Lids are normal.     Conjunctiva/sclera: Conjunctivae normal.     Pupils: Pupils are equal, round, and reactive to light.  Neck:      Thyroid: No thyroid mass.     Trachea: No tracheal deviation.  Cardiovascular:     Rate and Rhythm: Normal rate and regular rhythm.     Heart sounds: Normal heart sounds. No murmur heard.   No gallop.  Pulmonary:     Effort: Pulmonary effort is normal. No respiratory distress.     Breath sounds: Normal breath sounds. No stridor. No decreased breath sounds, wheezing, rhonchi or rales.  Chest:    Abdominal:     General: There is no distension.     Palpations: Abdomen is soft.     Tenderness: There is no abdominal tenderness. There is no rebound.  Musculoskeletal:  General: No tenderness. Normal range of motion.       Arms:     Cervical back: Normal range of motion and neck supple.     Comments: Pinpoint tenderness at patient's upper trapezius muscle on the right.  Neurovascular tact at right hand.  Skin:    General: Skin is warm and dry.     Findings: No abrasion or rash.  Neurological:     Mental Status: She is alert and oriented to person, place, and time. Mental status is at baseline.     GCS: GCS eye subscore is 4. GCS verbal subscore is 5. GCS motor subscore is 6.     Cranial Nerves: No cranial nerve deficit.     Sensory: No sensory deficit.     Motor: Motor function is intact.  Psychiatric:        Attention and Perception: Attention normal.        Speech: Speech normal.        Behavior: Behavior normal.    ED Results / Procedures / Treatments   Labs (all labs ordered are listed, but only abnormal results are displayed) Labs Reviewed  CBC - Abnormal; Notable for the following components:      Result Value   WBC 11.3 (*)    All other components within normal limits  BASIC METABOLIC PANEL  HEPATIC FUNCTION PANEL  LIPASE, BLOOD  I-STAT BETA HCG BLOOD, ED ([facility], [facility], [facility] ONLY)  TROPONIN I (HIGH SENSITIVITY)  TROPONIN I (HIGH SENSITIVITY)    EKG EKG Interpretation  Date/Time:  Tuesday January 09 2022 11:49:17 EST Ventricular Rate:  84 PR  Interval:  136 QRS Duration: 84 QT Interval:  380 QTC Calculation: 449 R Axis:   56 Text Interpretation: Normal sinus rhythm Normal ECG When compared with ECG of 06-Jul-2021 13:52, PREVIOUS ECG IS PRESENT Confirmed by Lacretia Leigh ([id number]) on 01/09/2022 5:58:42 PM  Radiology DG Chest 2 View  Result Date: 01/09/2022 CLINICAL DATA:  Chest pain. EXAM: CHEST - 2 VIEW COMPARISON:  07/17/2021 FINDINGS: The cardiac silhouette, mediastinal and hilar contours are normal and stable. Stable mild tortuosity and calcification of the thoracic aorta. The lungs are clear of an acute process. No pulmonary lesions or pleural effusions. The bony thorax is intact. IMPRESSION: No acute cardiopulmonary findings. Electronically Signed   By: Marijo Sanes M.D.   On: 01/09/2022 12:10    Procedures Procedures    Medications Ordered in ED Medications - No data to display  ED Course/ Medical Decision Making/ A&P                           Medical Decision Making Amount and/or Complexity of Data Reviewed Labs: ordered. Radiology: ordered.   Patient is EKG without acute ischemic changes at this time.  Chest x-ray without findings either.  Patient with reproducible tenderness.  Suspect musculoskeletal etiology.  Troponin negative.  No suspicion for ACS or dissection.  No vascular involvement noted.  Will discharge        Final Clinical Impression(s) / ED Diagnoses Final diagnoses:  None    Rx / DC Orders ED Discharge Orders     None         Lacretia Leigh, MD 01/09/22 1810

## 2022-01-09 NOTE — Addendum Note (Signed)
Addended by: Eloy End D on: 01/09/2022 11:46 AM   Modules accepted: Orders

## 2022-01-09 NOTE — ED Triage Notes (Signed)
Pt presents to ED Pov. Pt c/o R CP that radiates towards back. Pt reports that pain began Friday. Reports nausea and lethargy since pain began. Pt has no ardiac hx but reports family hx

## 2022-01-09 NOTE — ED Notes (Signed)
Patient verbalizes understanding of discharge instructions. Prescriptions reviewed. Opportunity for questioning and answers were provided. Armband removed by staff, pt discharged from ED ambulatory.

## 2022-01-10 ENCOUNTER — Ambulatory Visit: Payer: Medicare HMO

## 2022-01-12 ENCOUNTER — Other Ambulatory Visit: Payer: Self-pay

## 2022-01-12 ENCOUNTER — Ambulatory Visit (INDEPENDENT_AMBULATORY_CARE_PROVIDER_SITE_OTHER): Payer: Medicare HMO | Admitting: Internal Medicine

## 2022-01-12 ENCOUNTER — Encounter: Payer: Self-pay | Admitting: Internal Medicine

## 2022-01-12 VITALS — BP 112/66 | HR 87 | Temp 98.9°F | Ht 63.0 in | Wt 153.0 lb

## 2022-01-12 DIAGNOSIS — J45901 Unspecified asthma with (acute) exacerbation: Secondary | ICD-10-CM

## 2022-01-12 DIAGNOSIS — I10 Essential (primary) hypertension: Secondary | ICD-10-CM

## 2022-01-12 DIAGNOSIS — M5412 Radiculopathy, cervical region: Secondary | ICD-10-CM | POA: Diagnosis not present

## 2022-01-12 MED ORDER — METHYLPREDNISOLONE ACETATE 80 MG/ML IJ SUSP
80.0000 mg | Freq: Once | INTRAMUSCULAR | Status: AC
  Administered 2022-01-12: 80 mg via INTRAMUSCULAR

## 2022-01-12 MED ORDER — GABAPENTIN 100 MG PO CAPS: 100.0000 mg | ORAL_CAPSULE | Freq: Three times a day (TID) | ORAL | 3 refills | Status: DC

## 2022-01-12 MED ORDER — PREDNISONE 10 MG PO TABS: ORAL_TABLET | ORAL | 0 refills | Status: DC

## 2022-01-12 NOTE — Assessment & Plan Note (Signed)
With mild worsening s/p prior surgury, for trial gabapentin 100 tid

## 2022-01-12 NOTE — Progress Notes (Signed)
Patient ID: [name], female   DOB: 1961/05/29, 61 y.o.   MRN: [id number]        Chief Complaint: follow up left neck and chest pain, wheezing,        HPI:  [name] is a 61 y.o. female here with c/o left neck pain with raidation to left neck, upper back, left occiput and side of head and left upper chest, burning, mild to mod, without worsening UE pain weakness or numbness.  Pt denies chest pain, increased sob or doe, orthopnea, PND, increased LE swelling, palpitations, dizziness or syncope, except for mild wheezing worsening in the past 2 days.   Pt denies fever, wt loss, night sweats, loss of appetite, or other constitutional symptoms         Wt Readings from Last 3 Encounters:  01/12/22 153 lb (69.4 kg)  01/09/22 153 lb 12.8 oz (69.8 kg)  10/11/21 152 lb 9.6 oz (69.2 kg)   BP Readings from Last 3 Encounters:  01/12/22 112/66  01/09/22 120/79  01/09/22 104/64         Past Medical History:  Diagnosis Date   ALLERGIC RHINITIS 05/12/2009   Qualifier: Diagnosis of  By: Lenn Cal Deborra Medina), [name]     Allergy    Anemia, iron deficiency 12/22/2014   pt. denies   Anginal pain ([place])    Anxiety    Arthritis    Asthma    ASTHMA 05/12/2009   Severe AFL (Spirometry 05/2009: pre-BD FEV1 0.87L 34% pred, post-BD FEV1 1.11L 44% pred) Volumes hyperinflated Decreased DLCO that does not fully correct to normal range for alveolar volume.      Bipolar disorder ([place])    COPD 08/24/2009   Qualifier: Diagnosis of  By: Burnett Kanaris     COPD (chronic obstructive pulmonary disease) ([place])    Depression    Dyspnea    sometimes when exerting self   Fibromyalgia 05/14/2014   GERD (gastroesophageal reflux disease)    Headache    History of kidney stones    Hyperlipidemia 04/20/2017   HYPERTENSION 05/12/2009   Qualifier: Diagnosis of  By: Lenn Cal Deborra Medina), [name]     Kidney stones    Peripheral vascular disease ([place])    Pneumonia    Prediabetes 02/23/2014   pt. denies    Seizure ([place])    Seizure ([place])    Stroke ([place]) 11/2020   Urticaria    Past Surgical History:  Procedure Laterality Date   ABDOMINAL HYSTERECTOMY     ANTERIOR CERVICAL DECOMP/DISCECTOMY FUSION N/A 07/28/2020   Procedure: ANTERIOR CERVICAL DECOMPRESSION/DISCECTOMY FUSION. INTERBODY PROTHESIS, PLATE/SCREWS CERVICAL THREE- CERVICAL FOUR, CERVICAL FOUR- CERVICAL FIVE;  Surgeon: Newman Pies, MD;  Location: [facility];  Service: Neurosurgery;  Laterality: N/A;   BACK SURGERY     COLONOSCOPY  12/20/2011   Procedure: COLONOSCOPY;  Surgeon: Landry Dyke, MD;  Location: [facility];  Service: Endoscopy;  Laterality: N/A;   COLONOSCOPY  03/05/2012   Procedure: COLONOSCOPY;  Surgeon: Landry Dyke, MD;  Location: [facility];  Service: Endoscopy;  Laterality: N/A;   DIAGNOSTIC LAPAROSCOPY     HEMORRHOID SURGERY     INCISE AND DRAIN ABCESS     KIDNEY STONE SURGERY     NECK SURGERY     x 2 Dr Orinda Kenner   SPINE SURGERY  2019   TOE SURGERY     TUBAL LIGATION      reports that she quit smoking about 2 years ago. Her smoking use included cigarettes. She  has a 9.00 pack-year smoking history. She has never used smokeless tobacco. She reports that she does not currently use alcohol. She reports current drug use. Drug: Marijuana. family history includes Alcohol abuse in her brother and father; Anemia in her daughter, sister, and sister; Anesthesia problems in her father; Asthma in her brother, mother, and sister; Brain cancer in her sister; COPD in her mother and sister; Colon cancer (age of onset: 52) in her father; Diabetes in her brother, father, mother, sister, sister, sister, and sister; Heart disease in her brother, brother, mother, and sister; Hypertension in her brother, daughter, sister, sister, sister, sister, and sister; Kidney disease in her father; Sleep apnea in her brother. Allergies  Allergen Reactions   Sulfa Antibiotics Nausea And Vomiting   Venofer [Iron Sucrose] Hives   Current  Outpatient Medications on File Prior to Visit  Medication Sig Dispense Refill   albuterol (PROVENTIL) (2.5 MG/3ML) 0.083% nebulizer solution Take 3 mLs (2.5 mg total) by nebulization every 6 (six) hours as needed for wheezing or shortness of breath. Reported on 06/04/2016 120 mL 11   albuterol (VENTOLIN HFA) 108 (90 Base) MCG/ACT inhaler Inhale 2 puffs into the lungs every 4 (four) hours as needed for wheezing or shortness of breath. 18 g 11   Alcohol Swabs (B-D SINGLE USE SWABS REGULAR) PADS Use as directed twice per day E11.9 200 each 3   atorvastatin (LIPITOR) 40 MG tablet TAKE 1 TABLET(40 MG) BY MOUTH DAILY 90 tablet 1   Biotin 1000 MCG tablet Take 1,000 mcg by mouth daily.     Blood Glucose Calibration (TRUE METRIX LEVEL 1) Low SOLN Use as directed once per day E11.9 3 each 3   Budeson-Glycopyrrol-Formoterol (BREZTRI AEROSPHERE) 160-9-4.8 MCG/ACT AERO INHALE 2 PUFFS INTO THE LUNGS TWICE DAILY WITH SPACER 10.7 g 5   carbamazepine (TEGRETOL) 200 MG tablet Take 2 tablets (400 mg total) by mouth 2 (two) times daily. 120 tablet 12   cetirizine (ZYRTEC) 10 MG tablet Can take one tablet by mouth once daily if needed for runny nose. 30 tablet 5   cyclobenzaprine (FLEXERIL) 5 MG tablet TAKE 1 TABLET(5 MG) BY MOUTH THREE TIMES DAILY AS NEEDED FOR MUSCLE SPASMS 60 tablet 5   diclofenac Sodium (VOLTAREN) 1 % GEL Apply 4 g topically 4 (four) times daily. (Patient taking differently: Apply 4 g topically 4 (four) times daily as needed (pain).) 100 g 0   doxepin (SINEQUAN) 25 MG capsule TAKE 1 CAPSULE(25 MG) BY MOUTH AT BEDTIME AS NEEDED FOR ITCHING 30 capsule 0   DUPIXENT 300 MG/2ML prefilled syringe Inject into the skin.     estradiol (ESTRACE) 0.5 MG tablet Take 1 tablet (0.5 mg total) by mouth daily. 90 tablet 4   famotidine (PEPCID) 20 MG tablet Take 1 tablet (20 mg total) by mouth 2 (two) times daily. 60 tablet 5   fluticasone (FLONASE) 50 MCG/ACT nasal spray Place 1 spray into both nostrils 2 (two) times  daily as needed for allergies or rhinitis. 16 g 5   hydrochlorothiazide (MICROZIDE) 12.5 MG capsule TAKE 1 CAPSULE(12.5 MG) BY MOUTH DAILY 90 capsule 1   hydrOXYzine (VISTARIL) 25 MG capsule Take 1 capsule (25 mg total) by mouth every 8 (eight) hours as needed. 30 capsule 6   ipratropium (ATROVENT HFA) 17 MCG/ACT inhaler Inhale 2 puffs into the lungs every 6 (six) hours as needed for wheezing. 1 each 12   ipratropium-albuterol (DUONEB) 0.5-2.5 (3) MG/3ML SOLN Take 3 mLs by nebulization every 6 (six) hours as  needed. 60 mL 0   irbesartan (AVAPRO) 75 MG tablet TAKE 1 TABLET(75 MG) BY MOUTH DAILY 90 tablet 1   levocetirizine (XYZAL) 5 MG tablet Take 1 tablet (5 mg total) by mouth every evening. 30 tablet 5   montelukast (SINGULAIR) 10 MG tablet TAKE 1 TABLET(10 MG) BY MOUTH AT BEDTIME 30 tablet 0   ondansetron (ZOFRAN ODT) 4 MG disintegrating tablet Take 1 tablet (4 mg total) by mouth every 8 (eight) hours as needed for nausea or vomiting. 20 tablet 0   pravastatin (PRAVACHOL) 40 MG tablet Take 40 mg by mouth daily as needed.     promethazine-dextromethorphan (PROMETHAZINE-DM) 6.25-15 MG/5ML syrup Take 5 mLs by mouth 4 (four) times daily as needed for cough. 118 mL 1   triamcinolone ointment (KENALOG) 0.1 % Apply 1 application topically 2 (two) times daily. 454 g 1   UNKNOWN TO PATIENT Weekly allergy shots     Cholecalciferol (VITAMIN D) 125 MCG (5000 UT) CAPS Take 5,000 Units by mouth daily. (Patient not taking: Reported on 01/09/2022)     lidocaine (LIDODERM) 5 % Place 1 patch onto the skin daily. Remove & Discard patch within 12 hours or as directed by MD (Patient not taking: Reported on 01/12/2022) 30 patch 0   pantoprazole (PROTONIX) 40 MG tablet TAKE 1 TABLET BY MOUTH EVERY DAY (Patient not taking: Reported on 01/09/2022) 90 tablet 1   potassium chloride (KLOR-CON) 10 MEQ tablet Take 1 tablet (10 mEq total) by mouth daily. (Patient not taking: Reported on 01/09/2022) 90 tablet 3   vitamin C (ASCORBIC  ACID) 500 MG tablet Take 500 mg by mouth daily.  (Patient not taking: Reported on 01/09/2022)     Current Facility-Administered Medications on File Prior to Visit  Medication Dose Route Frequency Provider Last Rate Last Admin   dupilumab (DUPIXENT) prefilled syringe 300 mg  300 mg Subcutaneous Q14 Days Valentina Shaggy, MD   300 mg at 01/09/22 0956        ROS:  All others reviewed and negative.  Objective        PE:  BP 112/66 (BP Location: Right Arm, Patient Position: Sitting, Cuff Size: Normal)    Pulse 87    Temp 98.9 F (37.2 C) (Oral)    Ht 5\' 3"  (1.6 m)    Wt 153 lb (69.4 kg)    SpO2 97%    BMI 27.10 kg/m                 Constitutional: Pt appears in NAD               HENT: Head: NCAT.                Right Ear: External ear normal.                 Left Ear: External ear normal.                Eyes: . Pupils are equal, round, and reactive to light. Conjunctivae and EOM are normal               Nose: without d/c or deformity               Neck: Neck supple. Gross normal ROM               Cardiovascular: Normal rate and regular rhythm.                 Pulmonary/Chest: Effort normal and  breath sounds without rales but mild hweezing wheezing.                Abd:  Soft, NT, ND, + BS, no organomegaly               Neurological: Pt is alert. At baseline orientation, motor grossly intact               Skin: Skin is warm. No rashes, no other new lesions, LE edema - none               Psychiatric: Pt behavior is normal without agitation   Micro: none  Cardiac tracings I have personally interpreted today:  none  Pertinent Radiological findings (summarize): none   Lab Results  Component Value Date   WBC 11.3 (H) 01/09/2022   HGB 12.8 01/09/2022   HCT 38.2 01/09/2022   PLT 386 01/09/2022   GLUCOSE 87 01/09/2022   CHOL 142 04/11/2021   TRIG 47.0 04/11/2021   HDL 64.30 04/11/2021   LDLCALC 69 04/11/2021   ALT 16 01/09/2022   AST 19 01/09/2022   NA 139 01/09/2022   K 3.9  01/09/2022   CL 101 01/09/2022   CREATININE 0.80 01/09/2022   BUN 8 01/09/2022   CO2 29 01/09/2022   TSH 0.47 04/11/2021   HGBA1C 6.2 04/11/2021   MICROALBUR 0.9 04/11/2021   Assessment/Plan:  [name] is a 61 y.o. Black or African American [2] female with  has a past medical history of ALLERGIC RHINITIS (05/12/2009), Allergy, Anemia, iron deficiency (12/22/2014), Anginal pain ([place]), Anxiety, Arthritis, Asthma, ASTHMA (05/12/2009), Bipolar disorder ([place]), COPD (08/24/2009), COPD (chronic obstructive pulmonary disease) ([place]), Depression, Dyspnea, Fibromyalgia (05/14/2014), GERD (gastroesophageal reflux disease), Headache, History of kidney stones, Hyperlipidemia (04/20/2017), HYPERTENSION (05/12/2009), Kidney stones, Peripheral vascular disease ([place]), Pneumonia, Prediabetes (02/23/2014), Seizure ([place]), Seizure ([place]), Stroke ([place]) (11/2020), and Urticaria.  Cervical radiculopathy With mild worsening s/p prior surgury, for trial gabapentin 100 tid  Asthma exacerbation With mild uncontrolled, for depomedrol im 80, predpac asd,  to f/u any worsening symptoms or concern, continue inhaler, f/u allergy  Essential (primary) hypertension BP Readings from Last 3 Encounters:  01/12/22 112/66  01/09/22 120/79  01/09/22 104/64   Stable, pt to continue medical treatment hct, avapro  Followup: Return if symptoms worsen or fail to improve.  Cathlean Cower, MD 01/12/2022 9:45 PM [facility]

## 2022-01-12 NOTE — Patient Instructions (Addendum)
You had the steroid shot today  Please take all new medication as prescribed - the prednisone, and also the gabepentin as directed for pain  Please continue all other medications as before, and refills have been done if requested.  Please have the pharmacy call with any other refills you may need.  Please continue your efforts at being more active, low cholesterol diet, and weight control.  Please keep your appointments with your specialists as you may have planned  Please make an Appointment to return in 6 months, or sooner if needed

## 2022-01-12 NOTE — Assessment & Plan Note (Signed)
With mild uncontrolled, for depomedrol im 80, predpac asd,  to f/u any worsening symptoms or concern, continue inhaler, f/u allergy

## 2022-01-12 NOTE — Assessment & Plan Note (Signed)
BP Readings from Last 3 Encounters:  01/12/22 112/66  01/09/22 120/79  01/09/22 104/64   Stable, pt to continue medical treatment hct, avapro

## 2022-01-19 ENCOUNTER — Ambulatory Visit (INDEPENDENT_AMBULATORY_CARE_PROVIDER_SITE_OTHER): Payer: Medicare HMO | Admitting: *Deleted

## 2022-01-19 DIAGNOSIS — J309 Allergic rhinitis, unspecified: Secondary | ICD-10-CM

## 2022-01-23 ENCOUNTER — Ambulatory Visit (INDEPENDENT_AMBULATORY_CARE_PROVIDER_SITE_OTHER): Payer: Medicare HMO | Admitting: *Deleted

## 2022-01-23 ENCOUNTER — Other Ambulatory Visit: Payer: Self-pay

## 2022-01-23 DIAGNOSIS — J4550 Severe persistent asthma, uncomplicated: Secondary | ICD-10-CM | POA: Diagnosis not present

## 2022-01-29 ENCOUNTER — Other Ambulatory Visit: Payer: Self-pay | Admitting: Internal Medicine

## 2022-01-29 NOTE — Telephone Encounter (Signed)
Needs ROV 

## 2022-02-02 ENCOUNTER — Ambulatory Visit (INDEPENDENT_AMBULATORY_CARE_PROVIDER_SITE_OTHER): Payer: Medicare HMO | Admitting: Family Medicine

## 2022-02-02 ENCOUNTER — Encounter: Payer: Self-pay | Admitting: Family Medicine

## 2022-02-02 ENCOUNTER — Other Ambulatory Visit: Payer: Self-pay

## 2022-02-02 VITALS — BP 122/70 | HR 96 | Temp 98.0°F | Resp 16 | Ht 63.0 in | Wt 158.4 lb

## 2022-02-02 DIAGNOSIS — J309 Allergic rhinitis, unspecified: Secondary | ICD-10-CM

## 2022-02-02 DIAGNOSIS — R051 Acute cough: Secondary | ICD-10-CM | POA: Diagnosis not present

## 2022-02-02 DIAGNOSIS — K219 Gastro-esophageal reflux disease without esophagitis: Secondary | ICD-10-CM

## 2022-02-02 DIAGNOSIS — J449 Chronic obstructive pulmonary disease, unspecified: Secondary | ICD-10-CM | POA: Diagnosis not present

## 2022-02-02 DIAGNOSIS — J4540 Moderate persistent asthma, uncomplicated: Secondary | ICD-10-CM | POA: Insufficient documentation

## 2022-02-02 DIAGNOSIS — J4550 Severe persistent asthma, uncomplicated: Secondary | ICD-10-CM | POA: Diagnosis not present

## 2022-02-02 MED ORDER — AMOXICILLIN-POT CLAVULANATE 875-125 MG PO TABS: 1.0000 | ORAL_TABLET | Freq: Two times a day (BID) | ORAL | 0 refills | Status: DC

## 2022-02-02 NOTE — Patient Instructions (Addendum)
Cough Begin Augmentin 875 mg twice a day for 10 days Get a chest CT scan to help [place] evaluate your cough and breathing Call the clinic if you are cough worsens or if you develop a fever  Asthma Add Alvesco 160-2 puffs twice a day for for cough and wheeze Continue montelukast 10 mg once a day to prevent cough or wheeze Continue Breztri 2 puffs twice a day with a spacer to prevent cough or wheeze Continue DuoNeb via nebulizer once every 4-6 hours as needed for cough or wheeze Continue Dupixent injections once every 2 weeks and have access to an epinephrine autoinjector set  Allergic rhinitis Continue Xyzal 5 mg once a day as needed for runny nose Continue Flonase 2 sprays in each nostril once a day as needed for stuffy nose Consider saline nasal rinses as needed for nasal symptoms. Use this before any medicated nasal sprays for best result Continue allergen avoidance measures directed toward dust mite as listed below Continue allergen immunotherapy once a week and have access to an epinephrine auto-injector  Reflux Continue dietary and lifestyle modifications as listed below Continue famotidine 20 mg twice a day as previously prescribed  Atopic dermatitis Continue twice a day moisturizing routine  Recurrent infection Use the least amount of medications while remaining hive free Levocetirizine (Xyzal) 5 mg twice a day and famotidine (Pepcid) 20 mg twice a day. If no symptoms for 7-14 days then decrease to Levocetirizine (Xyzal) 5 mg twice a day and famotidine (Pepcid) 20 mg once a day.  If no symptoms for 7-14 days then decrease to Levocetirizine (Xyzal) 5 mg twice a day.  If no symptoms for 7-14 days then decrease to Levocetirizine (Xyzal) 5 mg once a day.    Call the clinic if this treatment plan is not working well for you  Follow up in 2 weeks or sooner if needed.  Control of Dust Mite Allergen Dust mites play a major role in allergic asthma and rhinitis. They occur in  environments with high humidity wherever human skin is found. Dust mites absorb humidity from the atmosphere (ie, they do not drink) and feed on organic matter (including shed human and animal skin). Dust mites are a microscopic type of insect that you cannot see with the naked eye. High levels of dust mites have been detected from mattresses, pillows, carpets, upholstered furniture, bed covers, clothes, soft toys and any woven material. The principal allergen of the dust mite is found in its feces. A gram of dust may contain 1,000 mites and 250,000 fecal particles. Mite antigen is easily measured in the air during house cleaning activities. Dust mites do not bite and do not cause harm to humans, other than by triggering allergies/asthma.  Ways to decrease your exposure to dust mites in your home:  1. Encase mattresses, box springs and pillows with a mite-impermeable barrier or cover  2. Wash sheets, blankets and drapes weekly in hot water (130 F) with detergent and dry them in a dryer on the hot setting.  3. Have the room cleaned frequently with a vacuum cleaner and a damp dust-mop. For carpeting or rugs, vacuuming with a vacuum cleaner equipped with a high-efficiency particulate air (HEPA) filter. The dust mite allergic individual should not be in a room which is being cleaned and should wait 1 hour after cleaning before going into the room.  4. Do not sleep on upholstered furniture (eg, couches).  5. If possible removing carpeting, upholstered furniture and drapery from the home  is ideal. Horizontal blinds should be eliminated in the rooms where the person spends the most time (bedroom, study, television room). Washable vinyl, roller-type shades are optimal.  6. Remove all non-washable stuffed toys from the bedroom. Wash stuffed toys weekly like sheets and blankets above.  7. Reduce indoor humidity to less than 50%. Inexpensive humidity monitors can be purchased at most hardware stores. Do not  use a humidifier as can make the problem worse and are not recommended.   Lifestyle Changes for Controlling GERD When you have GERD, stomach acid feels as if its backing up toward your mouth. Whether or not you take medication to control your GERD, your symptoms can often be improved with lifestyle changes.   Raise Your Head Reflux is more likely to strike when youre lying down flat, because stomach fluid can flow backward more easily. Raising the head of your bed 4-6 inches can help. To do this: Slide blocks or books under the legs at the head of your bed. Or, place a wedge under the mattress. Many foam stores can make a suitable wedge for you. The wedge should run from your waist to the top of your head. Dont just prop your head on several pillows. This increases pressure on your stomach. It can make GERD worse.  Watch Your Eating Habits Certain foods may increase the acid in your stomach or relax the lower esophageal sphincter, making GERD more likely. Its best to avoid the following: Coffee, tea, and carbonated drinks (with and without caffeine) Fatty, fried, or spicy food Mint, chocolate, onions, and tomatoes Any other foods that seem to irritate your stomach or cause you pain  Relieve the Pressure Eat smaller meals, even if you have to eat more often. Dont lie down right after you eat. Wait a few hours for your stomach to empty. Avoid tight belts and tight-fitting clothes. Lose excess weight.  Tobacco and Alcohol Avoid smoking tobacco and drinking alcohol. They can make GERD symptoms worse.

## 2022-02-02 NOTE — Progress Notes (Signed)
[facility] [place] [address] Dept: [phone]  FOLLOW UP NOTE  Patient ID: [name], female    DOB: February 26, 1961  Age: 61 y.o. MRN: [id number] Date of Office Visit: 02/02/2022  Assessment  Chief Complaint: Wheezing (Going on for two days), Asthma (SOB, coughing up mucous (brownish), used rescue inhaler but it didn't seem to help at all.), and Allergic Rhinitis  (Doing pretty good)  HPI [name] is a 61 year old female who presents to the clinic for evaluation of wheeze.  She was last seen in this clinic on 01/09/2022 by Dr. Ernst Bowler for evaluation of COPD, allergic rhinitis on allergen immunotherapy, chronic urticaria, atopic dermatitis, recurrent infection and chest pain.  Later on 01/09/2022 she presented to the emergency department for evaluation of chest pain.  At that visit, she had a chest x-ray that indicated " lungs are clear of acute process.  No pulmonary lesions or pleural effusions. The bony thorax is intact".  She visited Dr. Jenny Reichmann on 01/13/2020 for asthma with acute exacerbation and received a Depo-Medrol injection as well as an oral prednisone taper.  She reports that she was beginning to feel some relief of her asthma symptoms after her visit to Dr. Jenny Reichmann and receiving steroids, however, she reports that over the weekend they had a family meeting and she sat next to a person with viral illness symptoms.  At today's visit, she reports her asthma has been poorly controlled for the last several days with symptoms including chest tightness yesterday and today, some shortness of breath which is worse with activity and overnight, wheezing occurring daytime and nighttime, and cough producing brown thick phlegm which began on Monday.  She reports that she did have a fever about 2 days ago for which she took a cool bath and used a cold pack.  She does not believe that she has had a fever since that time.  She denies sweats and chills as well as body aches.  She  continues montelukast 10 mg once a day, Breztri 2 puffs twice a day with a spacer, and albuterol several times a day with moderate relief of symptoms beginning on Monday.  She continues Dupixent injections once every 2 weeks with moderate relief of her asthma symptoms.  Allergic rhinitis is reported as moderately well controlled with symptoms including occasional rhinorrhea which is beginning to clear, occasional sneeze, and frequent throat clearing.  She continues a daily antihistamine as well as Flonase as needed.  She continues allergen immunotherapy with no large or local reactions.  She reports a moderate decrease in her symptoms of allergic rhinitis while continuing on allergen immunotherapy.  Urticaria is reported as well controlled with no breakouts since her last visit to this clinic.  She continues Xyzal once a day and is not currently using famotidine.  Reflux is reported as well controlled with no symptoms including heartburn or vomiting.  She continues pantoprazole 40 mg once a day.  Her current medications are listed in the chart.   Drug Allergies:  Allergies  Allergen Reactions   Sulfa Antibiotics Nausea And Vomiting   Venofer [Iron Sucrose] Hives    Physical Exam: BP 122/70    Pulse 96    Temp 98 F (36.7 C) (Temporal)    Resp 16    Ht 5\' 3"  (1.6 m)    Wt 158 lb 6.4 oz (71.8 kg)    SpO2 96%    BMI 28.06 kg/m    Physical Exam Vitals reviewed.  Constitutional:  Appearance: Normal appearance.  HENT:     Head: Normocephalic and atraumatic.     Right Ear: Tympanic membrane normal.     Left Ear: Tympanic membrane normal.     Nose:     Comments: Bilateral naris slightly erythematous with clear nasal drainage noted.  Pharynx normal.  Ears normal.  Eyes normal.    Mouth/Throat:     Pharynx: Oropharynx is clear.  Eyes:     Conjunctiva/sclera: Conjunctivae normal.  Cardiovascular:     Rate and Rhythm: Normal rate and regular rhythm.     Heart sounds: Normal heart sounds. No  murmur heard. Pulmonary:     Comments: Bilateral inspiratory and expiratory wheezing.  No rhonchi.  Able to speak in full sentences, no accessory muscles used for breathing.  Oxygen saturation within normal limits. Musculoskeletal:     Cervical back: Normal range of motion and neck supple.  Neurological:     Mental Status: She is alert.    Diagnostics: Spirometry deferred due to recent fever and sick contacts  Assessment and Plan: 1. Not well controlled severe persistent asthma   2. Asthma-COPD overlap syndrome ([place])   3. Acute cough   4. Allergic rhinitis, unspecified seasonality, unspecified trigger   5. Gastroesophageal reflux disease, unspecified whether esophagitis present     Meds ordered this encounter  Medications   amoxicillin-clavulanate (AUGMENTIN) 875-125 MG tablet    Sig: Take 1 tablet by mouth 2 (two) times daily.    Dispense:  20 tablet    Refill:  0    Patient Instructions  Cough Begin Augmentin 875 mg twice a day for 10 days Get a chest CT scan to help [place] evaluate your cough and breathing Call the clinic if you are cough worsens or if you develop a fever  Asthma Add Alvesco 160-2 puffs twice a day for for cough and wheeze Continue montelukast 10 mg once a day to prevent cough or wheeze Continue Breztri 2 puffs twice a day with a spacer to prevent cough or wheeze Continue DuoNeb via nebulizer once every 4-6 hours as needed for cough or wheeze Continue Dupixent injections once every 2 weeks and have access to an epinephrine autoinjector set  Allergic rhinitis Continue Xyzal 5 mg once a day as needed for runny nose Continue Flonase 2 sprays in each nostril once a day as needed for stuffy nose Consider saline nasal rinses as needed for nasal symptoms. Use this before any medicated nasal sprays for best result Continue allergen avoidance measures directed toward dust mite as listed below Continue allergen immunotherapy once a week and have access to an  epinephrine auto-injector  Reflux Continue dietary and lifestyle modifications as listed below Continue famotidine 20 mg twice a day as previously prescribed  Atopic dermatitis Continue twice a day moisturizing routine  Recurrent infection Use the least amount of medications while remaining hive free Levocetirizine (Xyzal) 5 mg twice a day and famotidine (Pepcid) 20 mg twice a day. If no symptoms for 7-14 days then decrease to Levocetirizine (Xyzal) 5 mg twice a day and famotidine (Pepcid) 20 mg once a day.  If no symptoms for 7-14 days then decrease to Levocetirizine (Xyzal) 5 mg twice a day.  If no symptoms for 7-14 days then decrease to Levocetirizine (Xyzal) 5 mg once a day.    Call the clinic if this treatment plan is not working well for you  Follow up in 2 weeks or sooner if needed.   Return in about 2 weeks (around  02/16/2022), or if symptoms worsen or fail to improve.    Thank you for the opportunity to care for this patient.  Please do not hesitate to contact me with questions.  Gareth Morgan, FNP [facility]

## 2022-02-05 ENCOUNTER — Other Ambulatory Visit: Payer: Self-pay

## 2022-02-05 DIAGNOSIS — R053 Chronic cough: Secondary | ICD-10-CM

## 2022-02-05 NOTE — Progress Notes (Signed)
Per Webb Silversmith: Chest CT for cough ordered. Insurance was called and stated that a PA is required for this procedure. Reference number from call to insurance is [id number]. PA phone number is [phone] (healthhelp)

## 2022-02-06 ENCOUNTER — Other Ambulatory Visit: Payer: Self-pay

## 2022-02-06 ENCOUNTER — Ambulatory Visit: Payer: Medicare HMO

## 2022-02-06 ENCOUNTER — Observation Stay (HOSPITAL_COMMUNITY)
Admission: EM | Admit: 2022-02-06 | Discharge: 2022-02-07 | Disposition: A | Discharge status: Home / Self Care | Payer: Medicare HMO | Attending: Internal Medicine | Admitting: Internal Medicine

## 2022-02-06 ENCOUNTER — Emergency Department (HOSPITAL_COMMUNITY): Payer: Medicare HMO

## 2022-02-06 ENCOUNTER — Encounter (HOSPITAL_COMMUNITY): Payer: Self-pay

## 2022-02-06 DIAGNOSIS — Z20822 Contact with and (suspected) exposure to covid-19: Secondary | ICD-10-CM | POA: Insufficient documentation

## 2022-02-06 DIAGNOSIS — J441 Chronic obstructive pulmonary disease with (acute) exacerbation: Secondary | ICD-10-CM | POA: Diagnosis not present

## 2022-02-06 DIAGNOSIS — J45909 Unspecified asthma, uncomplicated: Secondary | ICD-10-CM | POA: Diagnosis not present

## 2022-02-06 DIAGNOSIS — R457 State of emotional shock and stress, unspecified: Secondary | ICD-10-CM | POA: Diagnosis not present

## 2022-02-06 DIAGNOSIS — Z87891 Personal history of nicotine dependence: Secondary | ICD-10-CM | POA: Diagnosis not present

## 2022-02-06 DIAGNOSIS — R0603 Acute respiratory distress: Secondary | ICD-10-CM | POA: Diagnosis present

## 2022-02-06 DIAGNOSIS — J449 Chronic obstructive pulmonary disease, unspecified: Secondary | ICD-10-CM | POA: Diagnosis not present

## 2022-02-06 DIAGNOSIS — Z8673 Personal history of transient ischemic attack (TIA), and cerebral infarction without residual deficits: Secondary | ICD-10-CM | POA: Insufficient documentation

## 2022-02-06 DIAGNOSIS — R0689 Other abnormalities of breathing: Secondary | ICD-10-CM | POA: Diagnosis not present

## 2022-02-06 DIAGNOSIS — R0902 Hypoxemia: Secondary | ICD-10-CM | POA: Diagnosis not present

## 2022-02-06 DIAGNOSIS — F319 Bipolar disorder, unspecified: Secondary | ICD-10-CM | POA: Diagnosis present

## 2022-02-06 DIAGNOSIS — R000 Tachycardia, unspecified: Secondary | ICD-10-CM | POA: Diagnosis not present

## 2022-02-06 DIAGNOSIS — I10 Essential (primary) hypertension: Secondary | ICD-10-CM | POA: Diagnosis present

## 2022-02-06 DIAGNOSIS — G40909 Epilepsy, unspecified, not intractable, without status epilepticus: Secondary | ICD-10-CM

## 2022-02-06 DIAGNOSIS — Z79899 Other long term (current) drug therapy: Secondary | ICD-10-CM | POA: Insufficient documentation

## 2022-02-06 DIAGNOSIS — R0602 Shortness of breath: Secondary | ICD-10-CM | POA: Diagnosis not present

## 2022-02-06 DIAGNOSIS — R569 Unspecified convulsions: Secondary | ICD-10-CM

## 2022-02-06 DIAGNOSIS — R062 Wheezing: Secondary | ICD-10-CM | POA: Diagnosis not present

## 2022-02-06 DIAGNOSIS — R7303 Prediabetes: Secondary | ICD-10-CM | POA: Diagnosis present

## 2022-02-06 DIAGNOSIS — E785 Hyperlipidemia, unspecified: Secondary | ICD-10-CM | POA: Diagnosis present

## 2022-02-06 LAB — COMPREHENSIVE METABOLIC PANEL
ALT: 19 U/L (ref 0–44)
AST: 21 U/L (ref 15–41)
Albumin: 3.9 g/dL (ref 3.5–5.0)
Alkaline Phosphatase: 78 U/L (ref 38–126)
Anion gap: 10 (ref 5–15)
BUN: 11 mg/dL (ref 6–20)
CO2: 31 mmol/L (ref 22–32)
Calcium: 8.9 mg/dL (ref 8.9–10.3)
Chloride: 100 mmol/L (ref 98–111)
Creatinine, Ser: 0.68 mg/dL (ref 0.44–1.00)
GFR, Estimated: >60 mL/min (ref >60)
Glucose, Bld: 171 mg/dL — ABNORMAL HIGH (ref 70–99)
Potassium: 3.2 mmol/L — ABNORMAL LOW (ref 3.5–5.1)
Sodium: 141 mmol/L (ref 135–145)
Total Bilirubin: 0.2 mg/dL — ABNORMAL LOW (ref 0.3–1.2)
Total Protein: 6.5 g/dL (ref 6.5–8.1)

## 2022-02-06 LAB — I-STAT VENOUS BLOOD GAS, ED
Acid-Base Excess: 5 mmol/L — ABNORMAL HIGH (ref 0.0–2.0)
Bicarbonate: 32.7 mmol/L — ABNORMAL HIGH (ref 20.0–28.0)
Calcium, Ion: 1.17 mmol/L (ref 1.15–1.40)
HCT: 37 % (ref 36.0–46.0)
Hemoglobin: 12.6 g/dL (ref 12.0–15.0)
O2 Saturation: 97 %
Potassium: 3.2 mmol/L — ABNORMAL LOW (ref 3.5–5.1)
Sodium: 141 mmol/L (ref 135–145)
TCO2: 35 mmol/L — ABNORMAL HIGH (ref 22–32)
pCO2, Ven: 61.8 mmHg — ABNORMAL HIGH (ref 44–60)
pH, Ven: 7.331 (ref 7.25–7.43)
pO2, Ven: 102 mmHg — ABNORMAL HIGH (ref 32–45)

## 2022-02-06 LAB — RESPIRATORY PANEL BY PCR

## 2022-02-06 LAB — CBC
HCT: 36.7 % (ref 36.0–46.0)
Hemoglobin: 12.1 g/dL (ref 12.0–15.0)
MCH: 31.8 pg (ref 26.0–34.0)
MCHC: 33 g/dL (ref 30.0–36.0)
MCV: 96.6 fL (ref 80.0–100.0)
Platelets: 351 10*3/uL (ref 150–400)
RBC: 3.8 MIL/uL — ABNORMAL LOW (ref 3.87–5.11)
RDW: 13.7 % (ref 11.5–15.5)
WBC: 12.3 10*3/uL — ABNORMAL HIGH (ref 4.0–10.5)
nRBC: 0 % (ref 0.0–0.2)

## 2022-02-06 LAB — RESP PANEL BY RT-PCR (FLU A&B, COVID) ARPGX2
Influenza A by PCR: NEGATIVE
Influenza B by PCR: NEGATIVE
SARS Coronavirus 2 by RT PCR: NEGATIVE

## 2022-02-06 LAB — TROPONIN I (HIGH SENSITIVITY)
Troponin I (High Sensitivity): 5 ng/L (ref <18)
Troponin I (High Sensitivity): 5 ng/L (ref <18)

## 2022-02-06 LAB — HEMOGLOBIN A1C
Hgb A1c MFr Bld: 5.6 % (ref 4.8–5.6)
Mean Plasma Glucose: 114 mg/dL

## 2022-02-06 LAB — GLUCOSE, CAPILLARY: Glucose-Capillary: 159 mg/dL — ABNORMAL HIGH (ref 70–99)

## 2022-02-06 LAB — PROTIME-INR
INR: 0.9 (ref 0.8–1.2)
Prothrombin Time: 12.4 seconds (ref 11.4–15.2)

## 2022-02-06 LAB — HIV ANTIBODY (ROUTINE TESTING W REFLEX): HIV Screen 4th Generation wRfx: NONREACTIVE

## 2022-02-06 LAB — BRAIN NATRIURETIC PEPTIDE: B Natriuretic Peptide: 13.7 pg/mL (ref 0.0–100.0)

## 2022-02-06 MED ORDER — SODIUM CHLORIDE 0.9% FLUSH
3.0000 mL | Freq: Two times a day (BID) | INTRAVENOUS | Status: DC
  Administered 2022-02-06 – 2022-02-07 (×3): 3 mL via INTRAVENOUS

## 2022-02-06 MED ORDER — ONDANSETRON HCL 4 MG/2ML IJ SOLN: 4.0000 mg | Freq: Four times a day (QID) | INTRAMUSCULAR | Status: DC | PRN

## 2022-02-06 MED ORDER — IRBESARTAN 75 MG PO TABS
75.0000 mg | ORAL_TABLET | Freq: Every day | ORAL | Status: DC
  Administered 2022-02-06 – 2022-02-07 (×2): 75 mg via ORAL
  Filled 2022-02-06 (×2): qty 1

## 2022-02-06 MED ORDER — MOMETASONE FURO-FORMOTEROL FUM 100-5 MCG/ACT IN AERO
2.0000 | INHALATION_SPRAY | Freq: Two times a day (BID) | RESPIRATORY_TRACT | Status: DC
  Administered 2022-02-06 – 2022-02-07 (×2): 2 via RESPIRATORY_TRACT
  Filled 2022-02-06: qty 8.8

## 2022-02-06 MED ORDER — HYDROCHLOROTHIAZIDE 12.5 MG PO TABS
12.5000 mg | ORAL_TABLET | Freq: Every day | ORAL | Status: DC
Start: 2022-02-06 — End: 2022-02-07
  Administered 2022-02-07: 12.5 mg via ORAL
  Filled 2022-02-06 (×3): qty 1

## 2022-02-06 MED ORDER — IPRATROPIUM-ALBUTEROL 0.5-2.5 (3) MG/3ML IN SOLN
3.0000 mL | RESPIRATORY_TRACT | Status: DC
  Administered 2022-02-06 – 2022-02-07 (×7): 3 mL via RESPIRATORY_TRACT
  Filled 2022-02-06 (×8): qty 3

## 2022-02-06 MED ORDER — POLYETHYLENE GLYCOL 3350 17 G PO PACK: 17.0000 g | PACK | Freq: Every day | ORAL | Status: DC | PRN

## 2022-02-06 MED ORDER — MONTELUKAST SODIUM 10 MG PO TABS
10.0000 mg | ORAL_TABLET | Freq: Every day | ORAL | Status: DC
  Administered 2022-02-06: 10 mg via ORAL
  Filled 2022-02-06 (×2): qty 1

## 2022-02-06 MED ORDER — BISACODYL 5 MG PO TBEC: 5.0000 mg | DELAYED_RELEASE_TABLET | Freq: Every day | ORAL | Status: DC | PRN

## 2022-02-06 MED ORDER — ALBUTEROL SULFATE (2.5 MG/3ML) 0.083% IN NEBU: 2.5000 mg | INHALATION_SOLUTION | RESPIRATORY_TRACT | Status: DC | PRN

## 2022-02-06 MED ORDER — IPRATROPIUM-ALBUTEROL 0.5-2.5 (3) MG/3ML IN SOLN
3.0000 mL | Freq: Once | RESPIRATORY_TRACT | Status: DC
  Filled 2022-02-06: qty 3

## 2022-02-06 MED ORDER — METHYLPREDNISOLONE SODIUM SUCC 125 MG IJ SOLR
80.0000 mg | Freq: Two times a day (BID) | INTRAMUSCULAR | Status: AC
  Administered 2022-02-06 (×2): 80 mg via INTRAVENOUS
  Filled 2022-02-06 (×2): qty 2

## 2022-02-06 MED ORDER — PANTOPRAZOLE SODIUM 40 MG PO TBEC
40.0000 mg | DELAYED_RELEASE_TABLET | Freq: Every day | ORAL | Status: DC
  Administered 2022-02-07: 40 mg via ORAL
  Filled 2022-02-06: qty 1

## 2022-02-06 MED ORDER — GABAPENTIN 100 MG PO CAPS
100.0000 mg | ORAL_CAPSULE | Freq: Three times a day (TID) | ORAL | Status: DC
Start: 2022-02-06 — End: 2022-02-07
  Administered 2022-02-06 – 2022-02-07 (×3): 100 mg via ORAL
  Filled 2022-02-06 (×4): qty 1

## 2022-02-06 MED ORDER — ENOXAPARIN SODIUM 40 MG/0.4ML IJ SOSY
40.0000 mg | PREFILLED_SYRINGE | INTRAMUSCULAR | Status: DC
  Administered 2022-02-06 – 2022-02-07 (×2): 40 mg via SUBCUTANEOUS
  Filled 2022-02-06 (×2): qty 0.4

## 2022-02-06 MED ORDER — UMECLIDINIUM BROMIDE 62.5 MCG/ACT IN AEPB
1.0000 | INHALATION_SPRAY | Freq: Every day | RESPIRATORY_TRACT | Status: DC
  Administered 2022-02-07: 1 via RESPIRATORY_TRACT
  Filled 2022-02-06: qty 7

## 2022-02-06 MED ORDER — GUAIFENESIN ER 600 MG PO TB12: 600.0000 mg | ORAL_TABLET | Freq: Two times a day (BID) | ORAL | Status: DC | PRN

## 2022-02-06 MED ORDER — OXYCODONE HCL 5 MG PO TABS: 5.0000 mg | ORAL_TABLET | ORAL | Status: DC | PRN

## 2022-02-06 MED ORDER — ONDANSETRON HCL 4 MG PO TABS: 4.0000 mg | ORAL_TABLET | Freq: Four times a day (QID) | ORAL | Status: DC | PRN

## 2022-02-06 MED ORDER — CARBAMAZEPINE 200 MG PO TABS
400.0000 mg | ORAL_TABLET | Freq: Two times a day (BID) | ORAL | Status: DC
  Administered 2022-02-06 – 2022-02-07 (×3): 400 mg via ORAL
  Filled 2022-02-06 (×5): qty 2

## 2022-02-06 MED ORDER — HYDRALAZINE HCL 20 MG/ML IJ SOLN: 5.0000 mg | INTRAMUSCULAR | Status: DC | PRN

## 2022-02-06 MED ORDER — FAMOTIDINE 20 MG PO TABS: 20.0000 mg | ORAL_TABLET | Freq: Two times a day (BID) | ORAL | Status: DC

## 2022-02-06 MED ORDER — ATORVASTATIN CALCIUM 40 MG PO TABS
40.0000 mg | ORAL_TABLET | Freq: Every day | ORAL | Status: DC
Start: 2022-02-06 — End: 2022-02-07
  Administered 2022-02-06 – 2022-02-07 (×2): 40 mg via ORAL
  Filled 2022-02-06 (×2): qty 1

## 2022-02-06 MED ORDER — ZOLPIDEM TARTRATE 5 MG PO TABS: 5.0000 mg | ORAL_TABLET | Freq: Every evening | ORAL | Status: DC | PRN

## 2022-02-06 MED ORDER — SODIUM CHLORIDE 0.9 % IV SOLN
2.0000 g | Freq: Three times a day (TID) | INTRAVENOUS | Status: DC
  Administered 2022-02-06 – 2022-02-07 (×3): 2 g via INTRAVENOUS
  Filled 2022-02-06 (×3): qty 2

## 2022-02-06 MED ORDER — ACETAMINOPHEN 325 MG PO TABS
650.0000 mg | ORAL_TABLET | Freq: Four times a day (QID) | ORAL | Status: DC | PRN
  Administered 2022-02-06: 650 mg via ORAL
  Filled 2022-02-06: qty 2

## 2022-02-06 MED ORDER — INSULIN ASPART 100 UNIT/ML IJ SOLN
0.0000 [IU] | Freq: Three times a day (TID) | INTRAMUSCULAR | Status: DC
  Administered 2022-02-07: 2 [IU] via SUBCUTANEOUS

## 2022-02-06 MED ORDER — DOCUSATE SODIUM 100 MG PO CAPS
100.0000 mg | ORAL_CAPSULE | Freq: Two times a day (BID) | ORAL | Status: DC
  Administered 2022-02-06 – 2022-02-07 (×2): 100 mg via ORAL
  Filled 2022-02-06 (×2): qty 1

## 2022-02-06 MED ORDER — ACETAMINOPHEN 650 MG RE SUPP: 650.0000 mg | Freq: Four times a day (QID) | RECTAL | Status: DC | PRN

## 2022-02-06 MED ORDER — LACTATED RINGERS IV SOLN: INTRAVENOUS | Status: AC

## 2022-02-06 MED ORDER — IPRATROPIUM-ALBUTEROL 0.5-2.5 (3) MG/3ML IN SOLN
3.0000 mL | Freq: Once | RESPIRATORY_TRACT | Status: AC
  Administered 2022-02-06: 3 mL via RESPIRATORY_TRACT

## 2022-02-06 MED ORDER — LEVOCETIRIZINE DIHYDROCHLORIDE 5 MG PO TABS
5.0000 mg | ORAL_TABLET | Freq: Every evening | ORAL | Status: DC
Start: 2022-02-06 — End: 2022-02-06

## 2022-02-06 MED ORDER — PREDNISONE 20 MG PO TABS
40.0000 mg | ORAL_TABLET | Freq: Every day | ORAL | Status: DC
  Administered 2022-02-07: 40 mg via ORAL
  Filled 2022-02-06: qty 2

## 2022-02-06 MED ORDER — HYDROXYZINE PAMOATE 25 MG PO CAPS
25.0000 mg | ORAL_CAPSULE | Freq: Three times a day (TID) | ORAL | Status: DC | PRN
  Filled 2022-02-06: qty 1

## 2022-02-06 MED ORDER — DOXEPIN HCL 25 MG PO CAPS: 25.0000 mg | ORAL_CAPSULE | Freq: Every evening | ORAL | Status: DC | PRN

## 2022-02-06 NOTE — H&P (Signed)
History and Physical    Patient: [name] XBM:[id number] DOB: July 03, 1961 DOA: 02/06/2022 DOS: the patient was seen and examined on 02/06/2022 PCP: Biagio Borg, MD  Patient coming from: Home - lives alone, son has been staying with her; NOK: [place], [place], [phone]   Chief Complaint: SOB  HPI: [name] is a 61 y.o. female with medical history significant of bipolar d/o; COPD;  depression/anxiety; HTN; HLD; prediabetes; seizure; and CVA presenting with SOB.  She reports onset of symptoms 8 days ago.  She was having cough and SOB and her doctor gave her Augmentin on 2/17.  She has not had any improvement and last night kept waking up SOB so she decided to come in.  No fever.  She was placed on BIPAP in the ER and reported feeling better.    ER Course:  Asthma/COPD exacerbation.  Hypoxia, placed on BIPAP.  Given steroids, Epi, Mag++, Duonebs with improvement.  She may be able to come off BIPAP soon.     Review of Systems: As mentioned in the history of present illness. All other systems reviewed and are negative. Past Medical History:  Diagnosis Date   Anemia, iron deficiency 12/22/2014   pt. 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      Bipolar disorder ([place])    COPD (chronic obstructive pulmonary disease) ([place])    Depression    Fibromyalgia 05/14/2014   GERD (gastroesophageal reflux disease)    History of kidney stones    Hyperlipidemia 04/20/2017   HYPERTENSION 05/12/2009   Qualifier: Diagnosis of  By: Lenn Cal Deborra Medina), [name]     Kidney stones    Peripheral vascular disease ([place])    Pneumonia    Prediabetes 02/23/2014   pt. denies   Seizure ([place])    Stroke ([place]) 11/2020   Urticaria    Past Surgical History:  Procedure Laterality Date    ABDOMINAL HYSTERECTOMY     ANTERIOR CERVICAL DECOMP/DISCECTOMY FUSION N/A 07/28/2020   Procedure: ANTERIOR CERVICAL DECOMPRESSION/DISCECTOMY FUSION. INTERBODY PROTHESIS, PLATE/SCREWS CERVICAL THREE- CERVICAL FOUR, CERVICAL FOUR- CERVICAL FIVE;  Surgeon: Newman Pies, MD;  Location: [facility];  Service: Neurosurgery;  Laterality: N/A;   BACK SURGERY     COLONOSCOPY  12/20/2011   Procedure: COLONOSCOPY;  Surgeon: Landry Dyke, MD;  Location: [facility];  Service: Endoscopy;  Laterality: N/A;   COLONOSCOPY  03/05/2012   Procedure: COLONOSCOPY;  Surgeon: Landry Dyke, MD;  Location: [facility];  Service: Endoscopy;  Laterality: N/A;   DIAGNOSTIC LAPAROSCOPY     HEMORRHOID SURGERY     INCISE AND DRAIN ABCESS     KIDNEY STONE SURGERY     NECK SURGERY     x 2 Dr Orinda Kenner   SPINE SURGERY  2019   TOE SURGERY     TUBAL LIGATION     Social History:  reports that she quit smoking about 3 years ago. Her smoking use included cigarettes. She has a 9.00 pack-year smoking history. She has never used smokeless tobacco. She reports that she does not currently use alcohol. She reports current drug use. Drug: Marijuana.  Allergies  Allergen Reactions   Sulfa Antibiotics Nausea And Vomiting   Venofer [Iron Sucrose] Hives    Family History  Problem Relation Age of Onset  Diabetes Mother    COPD Mother    Heart disease Mother    Asthma Mother    Diabetes Father    Kidney disease Father    Anesthesia problems Father    Alcohol abuse Father    Colon cancer Father 5   Diabetes Sister    Hypertension Sister    Heart disease Sister    Diabetes Sister    Brain cancer Sister    Hypertension Sister    Asthma Sister    Diabetes Sister    COPD Sister    Hypertension Sister    Anemia Sister    Hypertension Sister    Diabetes Sister    Anemia Sister    Hypertension Sister    Diabetes Brother    Sleep apnea Brother    Asthma Brother    Alcohol abuse Brother    Heart disease Brother     Heart disease Brother    Hypertension Brother    Hypertension Daughter    Anemia Daughter    Allergic rhinitis Neg Hx    Eczema Neg Hx    Urticaria Neg Hx    Esophageal cancer Neg Hx    Prostate cancer Neg Hx    Rectal cancer Neg Hx     Prior to Admission medications   Medication Sig Start Date End Date Taking? Authorizing Provider  albuterol (PROVENTIL) (2.5 MG/3ML) 0.083% nebulizer solution Take 3 mLs (2.5 mg total) by nebulization every 6 (six) hours as needed for wheezing or shortness of breath. Reported on 06/04/2016 08/11/20   Lauraine Rinne, NP  albuterol (VENTOLIN HFA) 108 (90 Base) MCG/ACT inhaler Inhale 2 puffs into the lungs every 4 (four) hours as needed for wheezing or shortness of breath. 10/11/21   Biagio Borg, MD  Alcohol Swabs (B-D SINGLE USE SWABS REGULAR) PADS Use as directed twice per day E11.9 Patient not taking: Reported on 02/02/2022 08/24/21   Biagio Borg, MD  amoxicillin-clavulanate (AUGMENTIN) 875-125 MG tablet Take 1 tablet by mouth 2 (two) times daily. 02/02/22   Dara Hoyer, FNP  atorvastatin (LIPITOR) 40 MG tablet TAKE 1 TABLET(40 MG) BY MOUTH DAILY 11/06/21   Biagio Borg, MD  Biotin 1000 MCG tablet Take 1,000 mcg by mouth daily.    [provider]  Blood Glucose Calibration (TRUE METRIX LEVEL 1) Low SOLN Use as directed once per day E11.9 Patient not taking: Reported on 02/02/2022 08/24/21   Biagio Borg, MD  Budeson-Glycopyrrol-Formoterol (BREZTRI AEROSPHERE) 160-9-4.8 MCG/ACT AERO INHALE 2 PUFFS INTO THE LUNGS TWICE DAILY WITH SPACER 10/11/21   Biagio Borg, MD  carbamazepine (TEGRETOL) 200 MG tablet Take 2 tablets (400 mg total) by mouth 2 (two) times daily. 06/28/21   Plovsky, Berneta Sages, MD  cetirizine (ZYRTEC) 10 MG tablet Can take one tablet by mouth once daily if needed for runny nose. 04/28/21   Dara Hoyer, FNP  Cholecalciferol (VITAMIN D) 125 MCG (5000 UT) CAPS Take 5,000 Units by mouth daily. Patient not taking: Reported on 01/09/2022     [provider]  cyclobenzaprine (FLEXERIL) 5 MG tablet TAKE 1 TABLET(5 MG) BY MOUTH THREE TIMES DAILY AS NEEDED FOR MUSCLE SPASMS 10/11/21   Biagio Borg, MD  diclofenac Sodium (VOLTAREN) 1 % GEL Apply 4 g topically 4 (four) times daily. Patient taking differently: Apply 4 g topically 4 (four) times daily as needed (pain). 06/04/20   Deno Etienne, DO  doxepin (SINEQUAN) 25 MG capsule TAKE 1 CAPSULE(25 MG) BY MOUTH AT  BEDTIME AS NEEDED FOR ITCHING 08/10/21   Valentina Shaggy, MD  DUPIXENT 300 MG/2ML prefilled syringe Inject into the skin. 12/25/21   [provider]  estradiol (ESTRACE) 0.5 MG tablet Take 1 tablet (0.5 mg total) by mouth daily. 09/21/21   Princess Bruins, MD  famotidine (PEPCID) 20 MG tablet Take 1 tablet (20 mg total) by mouth 2 (two) times daily. 09/06/21   Valentina Shaggy, MD  fluticasone [facility]) 50 MCG/ACT nasal spray Place 1 spray into both nostrils 2 (two) times daily as needed for allergies or rhinitis. 09/06/21   Valentina Shaggy, MD  gabapentin (NEURONTIN) 100 MG capsule Take 1 capsule (100 mg total) by mouth 3 (three) times daily. 01/12/22   Biagio Borg, MD  hydrochlorothiazide (MICROZIDE) 12.5 MG capsule TAKE 1 CAPSULE(12.5 MG) BY MOUTH DAILY 11/06/21   Biagio Borg, MD  hydrOXYzine (VISTARIL) 25 MG capsule Take 1 capsule (25 mg total) by mouth every 8 (eight) hours as needed. 06/28/21   Plovsky, Berneta Sages, MD  ipratropium (ATROVENT HFA) 17 MCG/ACT inhaler Inhale 2 puffs into the lungs every 6 (six) hours as needed for wheezing. 01/09/22   Valentina Shaggy, MD  ipratropium-albuterol (DUONEB) 0.5-2.5 (3) MG/3ML SOLN Take 3 mLs by nebulization every 6 (six) hours as needed. 08/21/20   Tasia Catchings, Amy V, PA-C  irbesartan (AVAPRO) 75 MG tablet TAKE 1 TABLET(75 MG) BY MOUTH DAILY 11/06/21   Biagio Borg, MD  levocetirizine (XYZAL) 5 MG tablet Take 1 tablet (5 mg total) by mouth every evening. 09/06/21   Valentina Shaggy, MD  lidocaine (LIDODERM) 5 %  Place 1 patch onto the skin daily. Remove & Discard patch within 12 hours or as directed by MD 01/09/22   Lacretia Leigh, MD  montelukast (SINGULAIR) 10 MG tablet TAKE 1 TABLET(10 MG) BY MOUTH AT BEDTIME 12/06/21   Collene Gobble, MD  ondansetron (ZOFRAN ODT) 4 MG disintegrating tablet Take 1 tablet (4 mg total) by mouth every 8 (eight) hours as needed for nausea or vomiting. 05/19/21   Valentina Shaggy, MD  pantoprazole (PROTONIX) 40 MG tablet TAKE 1 TABLET BY MOUTH EVERY DAY 05/24/21   Noralyn Pick, NP  potassium chloride (KLOR-CON) 10 MEQ tablet Take 1 tablet (10 mEq total) by mouth daily. 04/13/21   Biagio Borg, MD  pravastatin (PRAVACHOL) 40 MG tablet Take 40 mg by mouth daily as needed. 01/16/21   [provider]  predniSONE (DELTASONE) 10 MG tablet 3 tabs by mouth per day for 3 days,2tabs per day for 3 days,1tab per day for 3 days Patient not taking: Reported on 02/02/2022 01/12/22   Biagio Borg, MD  promethazine-dextromethorphan (PROMETHAZINE-DM) 6.25-15 MG/5ML syrup Take 5 mLs by mouth 4 (four) times daily as needed for cough. 10/11/21   Biagio Borg, MD  triamcinolone ointment (KENALOG) 0.1 % Apply 1 application topically 2 (two) times daily. 09/06/21   Valentina Shaggy, MD  UNKNOWN TO PATIENT Weekly allergy shots    [provider]  vitamin C (ASCORBIC ACID) 500 MG tablet Take 500 mg by mouth daily.  Patient not taking: Reported on 01/09/2022    [provider]    Physical Exam: Vitals:   02/06/22 1400 02/06/22 1415 02/06/22 1430 02/06/22 1445  BP: 115/89 111/75 120/78 115/74  Pulse: 74 77 78 70  Resp: 19 18 16 18   Temp:      TempSrc:      SpO2: 99% 99% 100% 99%   General:  Appears calm and comfortable and is in NAD,  on BIPAP at the time of my evaluation Eyes:  PERRL, EOMI, normal lids, iris ENT:  grossly normal hearing, lips & tongue, mmm; BIPAP in place Neck:  no LAD, masses or thyromegaly Cardiovascular:  RRR, no m/r/g. No LE  edema.  Respiratory:   Diffuse wheezes, limited air movement despite BIPAP.  Normal respiratory effort. Abdomen:  soft, NT, ND Skin:  no rash or induration seen on limited exam Musculoskeletal:  grossly normal tone BUE/BLE, good ROM, no bony abnormality Psychiatric:  grossly normal mood and affect, speech limited by BIPAP Neurologic:  CN 2-12 grossly intact, moves all extremities in coordinated fashion   Radiological Exams on Admission: Independently reviewed - see discussion in A/P where applicable  DG Chest Port 1 View  Result Date: 02/06/2022 CLINICAL DATA:  Shortness of breath. EXAM: PORTABLE CHEST 1 VIEW COMPARISON:  Lat 01/09/2022. FINDINGS: The heart size and mediastinal contours are stable with mild aortic uncoiling and calcifications. Both lungs are clear. The visualized skeletal structures are unremarkable apart from partially visible multilevel lower cervical ACDF plating. There are multiple overlying monitor wires. IMPRESSION: No active disease.  Stable chest. Electronically Signed   By: Telford Nab M.D.   On: 02/06/2022 06:06    EKG: Independently reviewed.  Sinus tachycardia with rate 100; nonspecific ST changes with no evidence of acute ischemia   Labs on Admission: I have personally reviewed the available labs and imaging studies at the time of the admission.  Pertinent labs:    VBG: 7.331/61.8/102/32.7 K+ 3.2 Glucose 171 BNP 13.7 HS troponin 5 WBC 12.3 INR 0.9    Assessment and Plan: * COPD/asthma- (present on admission) -Patient with h/o asthma and COPD presenting with wheezing/SOB refractory to outpatient therapy with Augmentin -She was placed on BIPAP with improvement and is currently on room air and feeling better -will observe patient overnight on telemetry -Nebulizers: prn albuterol and Duoneb -Solu-Medrol 80 mg IV BID -> prednisone -Will give Cefepime for possible COPD exacerbation component -Continue Singulair -She has been taking dupilumab with  improvement per her report -Continue Breztri (Incruse and Dulera per formulary) -Will also give Mucinex  Essential (primary) hypertension- (present on admission) -Continue HCTZ, Avapro  Seizure ([place]) -Continue carbamazepine  Hyperlipidemia- (present on admission) -Continue Lipitor  Bipolar I disorder ([place])- (present on admission) -Tegretol and Hydroxyzine appear to be her only current mood medications  Prediabetes- (present on admission) -Glucose was 171 on presentation -Prior A1c was 6.2, will repeat -No controlling medication is indicated for now -Will cover with moderate-scale SSI      Advance Care Planning:   Code Status: Full Code   Consults: TOC team  DVT Prophylaxis: Lovenox  Family Communication: None present; she is now capable of communicating with family  Severity of Illness: The appropriate patient status for this patient is OBSERVATION. Observation status is judged to be reasonable and necessary in order to provide the required intensity of service to ensure the patient's safety. The patient's presenting symptoms, physical exam findings, and initial radiographic and laboratory data in the context of their medical condition is felt to place them at decreased risk for further clinical deterioration. Furthermore, it is anticipated that the patient will be medically stable for discharge from the hospital within 2 midnights of admission.   Author: Karmen Bongo, MD 02/06/2022 5:51 PM  For on call review [email or web address].

## 2022-02-06 NOTE — ED Notes (Signed)
SBAR report handed off via secure chat to Anabel Halon RN.

## 2022-02-06 NOTE — Assessment & Plan Note (Signed)
Continue carbamazepine 

## 2022-02-06 NOTE — Assessment & Plan Note (Signed)
-  Patient with h/o asthma and COPD presenting with wheezing/SOB refractory to outpatient therapy with Augmentin -She was placed on BIPAP with improvement and is currently on room air and feeling better -will observe patient overnight on telemetry -Nebulizers: prn albuterol and Duoneb -Solu-Medrol 80 mg IV BID -> prednisone -Will give Cefepime for possible COPD exacerbation component -Continue Singulair -She has been taking dupilumab with improvement per her report -Continue Breztri (Incruse and Dulera per formulary) -Will also give Mucinex

## 2022-02-06 NOTE — Progress Notes (Signed)
Called insurance on Monday to find out if this patient needed a Prior Auth for this order. Prior Auth started today and information faxed to [facility]. Tracking number is [id number].

## 2022-02-06 NOTE — ED Notes (Signed)
Dinner try delivered for patient at this time.
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
## 2022-02-06 NOTE — Assessment & Plan Note (Signed)
-  Continue Lipitor °

## 2022-02-06 NOTE — ED Triage Notes (Signed)
Pt comes via [facility], woke up around 3am with resp distress, pt initial stats were in the upper 80's, wheezing all over, PTA pt received 15 albuterol, 1mg  atrovent, 0.3 epi, pt then became cool and clammy with EMS given 2.5 versed and 2mg  mag and 125 solumedrol

## 2022-02-06 NOTE — Assessment & Plan Note (Signed)
-  Tegretol and Hydroxyzine appear to be her only current mood medications

## 2022-02-06 NOTE — Assessment & Plan Note (Signed)
-  Glucose was 171 on presentation -Prior A1c was 6.2, will repeat -No controlling medication is indicated for now -Will cover with moderate-scale SSI

## 2022-02-06 NOTE — Assessment & Plan Note (Signed)
-  Continue HCTZ, Avapro

## 2022-02-07 ENCOUNTER — Other Ambulatory Visit (HOSPITAL_COMMUNITY): Payer: Self-pay

## 2022-02-07 DIAGNOSIS — J449 Chronic obstructive pulmonary disease, unspecified: Secondary | ICD-10-CM | POA: Diagnosis not present

## 2022-02-07 DIAGNOSIS — J441 Chronic obstructive pulmonary disease with (acute) exacerbation: Secondary | ICD-10-CM | POA: Diagnosis not present

## 2022-02-07 LAB — CBC
HCT: 32.8 % — ABNORMAL LOW (ref 36.0–46.0)
Hemoglobin: 10.9 g/dL — ABNORMAL LOW (ref 12.0–15.0)
MCH: 31.6 pg (ref 26.0–34.0)
MCHC: 33.2 g/dL (ref 30.0–36.0)
MCV: 95.1 fL (ref 80.0–100.0)
Platelets: 314 10*3/uL (ref 150–400)
RBC: 3.45 MIL/uL — ABNORMAL LOW (ref 3.87–5.11)
RDW: 13.4 % (ref 11.5–15.5)
WBC: 15.5 10*3/uL — ABNORMAL HIGH (ref 4.0–10.5)
nRBC: 0 % (ref 0.0–0.2)

## 2022-02-07 LAB — BASIC METABOLIC PANEL
Anion gap: 10 (ref 5–15)
BUN: 12 mg/dL (ref 6–20)
CO2: 25 mmol/L (ref 22–32)
Calcium: 8.7 mg/dL — ABNORMAL LOW (ref 8.9–10.3)
Chloride: 101 mmol/L (ref 98–111)
Creatinine, Ser: 0.63 mg/dL (ref 0.44–1.00)
GFR, Estimated: >60 mL/min (ref >60)
Glucose, Bld: 146 mg/dL — ABNORMAL HIGH (ref 70–99)
Potassium: 3.8 mmol/L (ref 3.5–5.1)
Sodium: 136 mmol/L (ref 135–145)

## 2022-02-07 LAB — GLUCOSE, CAPILLARY: Glucose-Capillary: 121 mg/dL — ABNORMAL HIGH (ref 70–99)

## 2022-02-07 MED ORDER — CALCIUM CARBONATE ANTACID 500 MG PO CHEW
1.0000 | CHEWABLE_TABLET | Freq: Once | ORAL | Status: AC
  Administered 2022-02-07: 200 mg via ORAL
  Filled 2022-02-07: qty 1

## 2022-02-07 MED ORDER — PREDNISONE 20 MG PO TABS
40.0000 mg | ORAL_TABLET | Freq: Every day | ORAL | 0 refills | Status: AC
  Filled 2022-02-07: qty 6, 3d supply, fill #0

## 2022-02-07 MED ORDER — ACETAMINOPHEN 325 MG PO TABS: 650.0000 mg | ORAL_TABLET | Freq: Four times a day (QID) | ORAL | Status: DC | PRN

## 2022-02-07 NOTE — Care Management Obs Status (Signed)
[facility] NOTIFICATION   Patient Details  Name: [name] MRN: [id number] Date of Birth: 1961-09-06   Medicare Observation Status Notification Given:       Cyndi Bender, RN 02/07/2022, 11:50 AM

## 2022-02-07 NOTE — TOC Transition Note (Signed)
Transition of Care [facility]) - CM/SW Discharge Note   Patient Details  Name: [name] MRN: [id number] Date of Birth: 15-Apr-1961  [facility] of [facility]) CM/SW Contact:  Cyndi Bender, RN Phone Number: 02/07/2022, 11:55 AM   Clinical Narrative:    Patient stable for discharge.  Patient lives alone but grandson has been staying with her. Patient has a walker at home. Boyfriend can transport patient home.  No other TOC needs   Final next level of care: Home/Self Care Barriers to Discharge: Barriers Resolved   Patient Goals and CMS Choice Patient states their goals for this hospitalization and ongoing recovery are:: retuen home      Discharge Placement               home        Discharge Plan and Services               home                      Social Determinants of Health (SDOH) Interventions     Readmission Risk Interventions No flowsheet data found.

## 2022-02-07 NOTE — Discharge Instructions (Signed)
Follow with Primary MD Biagio Borg, MD in 7 days   Get CBC, CMP, 2 view Chest X ray checked  by Primary MD next visit.    Activity: As tolerated with Full fall precautions use walker/cane & assistance as needed   Disposition Home    Diet: Heart Healthy  , with feeding assistance and aspiration precautions.   On your next visit with your primary care physician please Get Medicines reviewed and adjusted.   Please request your Prim.MD to go over all Hospital Tests and Procedure/Radiological results at the follow up, please get all Hospital records sent to your Prim MD by signing hospital release before you go home.   If you experience worsening of your admission symptoms, develop shortness of breath, life threatening emergency, suicidal or homicidal thoughts you must seek medical attention immediately by calling 911 or calling your MD immediately  if symptoms less severe.  You Must read complete instructions/literature along with all the possible adverse reactions/side effects for all the Medicines you take and that have been prescribed to you. Take any new Medicines after you have completely understood and accpet all the possible adverse reactions/side effects.   Do not drive, operating heavy machinery, perform activities at heights, swimming or participation in water activities or provide baby sitting services if your were admitted for syncope or siezures until you have seen by Primary MD or a Neurologist and advised to do so again.  Do not drive when taking Pain medications.    Do not take more than prescribed Pain, Sleep and Anxiety Medications  Special Instructions: If you have smoked or chewed Tobacco  in the last 2 yrs please stop smoking, stop any regular Alcohol  and or any Recreational drug use.  Wear Seat belts while driving.   Please note  You were cared for by a hospitalist during your hospital stay. If you have any questions about your discharge medications or the  care you received while you were in the hospital after you are discharged, you can call the unit and asked to speak with the hospitalist on call if the hospitalist that took care of you is not available. Once you are discharged, your primary care physician will handle any further medical issues. Please note that NO REFILLS for any discharge medications will be authorized once you are discharged, as it is imperative that you return to your primary care physician (or establish a relationship with a primary care physician if you do not have one) for your aftercare needs so that they can reassess your need for medications and monitor your lab values.

## 2022-02-07 NOTE — Progress Notes (Signed)
[name] is alert and oriented x4. Breathing is even and nonlabored. PIV removed with no issues. Educated patient on discharge teaching, stated understanding. NT to assist patient down to transportation.

## 2022-02-07 NOTE — Discharge Summary (Signed)
Physician Discharge Summary  [name] MEQ:[id number] DOB: Feb 02, 1961 DOA: 02/06/2022  PCP: Biagio Borg, MD  Admit date: 02/06/2022 Discharge date: 02/07/2022  Admitted From: Home Disposition:  Home   Recommendations for Outpatient Follow-up:  Follow up with PCP in 1-2 weeks Please obtain BMP/CBC in one week   Home Health:NO  Discharge Condition:Stable CODE STATUS:FULL, Diet recommendation: Heart Healthy  Brief/Interim Summary:   [name] is a 61 y.o. female with medical history significant of bipolar d/o; COPD;  depression/anxiety; HTN; HLD; prediabetes; seizure; and CVA presenting with SOB.  She reports onset of symptoms 8 days ago.  She was having cough and SOB and her doctor gave her Augmentin on 2/17.  She has not had any improvement and last night kept waking up SOB so she decided to come in.  No fever.  She was placed on BIPAP in the ER and reported feeling better.  She was started on steroids, antibiotics, she was given epi magnesium, DuoNebs, she received IV steroids in ED and admitted for observation.  COPD/asthma Sister patient.  (present on admission) -Patient with h/o asthma and COPD presenting with wheezing/SOB refractory to outpatient therapy with Augmentin -Required BiPAP while in ED, but no evidence of hypoxia or hypercapnia,. -She received IV steroids overnight, received epi in ED, and IV magnesium while in ED, she kept on IV cefepime at night as well. -She will be discharged on 3 days of prednisone 40 mg oral daily. -He is with minimal wheezing today, but no dyspnea, no hypoxia, ambulated in the hallways with no dyspnea or hypoxia. -Continue Singulair -She has been taking dupilumab with improvement per her report -Continue Breztri (Incruse and Dulera per formulary) -Leukocytosis trending up today, this is due to steroids.   Essential (primary) hypertension- (present on admission) -Continue HCTZ, Avapro   Seizure ([place]) -Continue  carbamazepine   Hyperlipidemia- (present on admission) -Continue Lipitor   Bipolar I disorder ([place])- (present on admission) -Tegretol and Hydroxyzine appear to be her only current mood medications   Prediabetes- (present on admission) -Glucose was 171 on presentation -Prior A1c was 6.2, will repeat -No controlling medication is indicated for now    Discharge Diagnoses:  Principal Problem:   COPD/asthma Active Problems:   Prediabetes   Bipolar I disorder ([place])   Hyperlipidemia   Seizure ([place])   Essential (primary) hypertension    Discharge Instructions  Discharge Instructions     Discharge instructions   Complete by: As directed    Follow with Primary MD Biagio Borg, MD in 7 days   Get CBC, CMP, 2 view Chest X ray checked  by Primary MD next visit.    Activity: As tolerated with Full fall precautions use walker/cane & assistance as needed   Disposition Home    Diet: Heart Healthy  , with feeding assistance and aspiration precautions.   On your next visit with your primary care physician please Get Medicines reviewed and adjusted.   Please request your Prim.MD to go over all Hospital Tests and Procedure/Radiological results at the follow up, please get all Hospital records sent to your Prim MD by signing hospital release before you go home.   If you experience worsening of your admission symptoms, develop shortness of breath, life threatening emergency, suicidal or homicidal thoughts you must seek medical attention immediately by calling 911 or calling your MD immediately  if symptoms less severe.  You Must read complete instructions/literature along with all the possible adverse reactions/side effects for all the  Medicines you take and that have been prescribed to you. Take any new Medicines after you have completely understood and accpet all the possible adverse reactions/side effects.   Do not drive, operating heavy machinery, perform activities at heights,  swimming or participation in water activities or provide baby sitting services if your were admitted for syncope or siezures until you have seen by Primary MD or a Neurologist and advised to do so again.  Do not drive when taking Pain medications.    Do not take more than prescribed Pain, Sleep and Anxiety Medications  Special Instructions: If you have smoked or chewed Tobacco  in the last 2 yrs please stop smoking, stop any regular Alcohol  and or any Recreational drug use.  Wear Seat belts while driving.   Please note  You were cared for by a hospitalist during your hospital stay. If you have any questions about your discharge medications or the care you received while you were in the hospital after you are discharged, you can call the unit and asked to speak with the hospitalist on call if the hospitalist that took care of you is not available. Once you are discharged, your primary care physician will handle any further medical issues. Please note that NO REFILLS for any discharge medications will be authorized once you are discharged, as it is imperative that you return to your primary care physician (or establish a relationship with a primary care physician if you do not have one) for your aftercare needs so that they can reassess your need for medications and monitor your lab values.   Increase activity slowly   Complete by: As directed       Allergies as of 02/07/2022       Reactions   Sulfa Antibiotics Nausea And Vomiting   Venofer [iron Sucrose] Hives        Medication List     STOP taking these medications    amoxicillin-clavulanate 875-125 MG tablet Commonly known as: Augmentin   BC Fast Pain Relief 650-195-33.3 MG Pack Generic drug: Aspirin-Salicylamide-Caffeine   BC Fast Pain Relief Arthritis 742-222-38 MG Pack Generic drug: Aspirin-Salicylamide-Caffeine   doxepin 25 MG capsule Commonly known as: SINEQUAN       TAKE these medications    acetaminophen 325  MG tablet Commonly known as: TYLENOL Take 2 tablets (650 mg total) by mouth every 6 (six) hours as needed for mild pain (or Fever >/= 101).   albuterol (2.5 MG/3ML) 0.083% nebulizer solution Commonly known as: PROVENTIL Take 3 mLs (2.5 mg total) by nebulization every 6 (six) hours as needed for wheezing or shortness of breath. Reported on 06/04/2016   albuterol 108 (90 Base) MCG/ACT inhaler Commonly known as: VENTOLIN HFA Inhale 2 puffs into the lungs every 4 (four) hours as needed for wheezing or shortness of breath.   atorvastatin 40 MG tablet Commonly known as: LIPITOR TAKE 1 TABLET(40 MG) BY MOUTH DAILY What changed: See the new instructions.   Atrovent HFA 17 MCG/ACT inhaler Generic drug: ipratropium Inhale 2 puffs into the lungs every 6 (six) hours as needed for wheezing.   Biotin 1000 MCG tablet Take 1,000 mcg by mouth daily.   Breztri Aerosphere 160-9-4.8 MCG/ACT Aero Generic drug: Budeson-Glycopyrrol-Formoterol INHALE 2 PUFFS INTO THE LUNGS TWICE DAILY WITH SPACER What changed:  how much to take how to take this when to take this additional instructions   carbamazepine 200 MG tablet Commonly known as: TEGretol Take 2 tablets (400 mg total) by mouth 2 (  two) times daily.   cetirizine 10 MG tablet Commonly known as: ZYRTEC Can take one tablet by mouth once daily if needed for runny nose.   cyclobenzaprine 5 MG tablet Commonly known as: FLEXERIL TAKE 1 TABLET(5 MG) BY MOUTH THREE TIMES DAILY AS NEEDED FOR MUSCLE SPASMS What changed:  how much to take how to take this when to take this reasons to take this additional instructions   diclofenac Sodium 1 % Gel Commonly known as: VOLTAREN Apply 4 g topically 4 (four) times daily. What changed:  when to take this reasons to take this   estradiol 0.5 MG tablet Commonly known as: ESTRACE Take 1 tablet (0.5 mg total) by mouth daily.   famotidine 20 MG tablet Commonly known as: PEPCID Take 20 mg by mouth  daily as needed for heartburn or indigestion. OTC What changed: Another medication with the same name was removed. Continue taking this medication, and follow the directions you see here.   fluticasone 50 MCG/ACT nasal spray Commonly known as: FLONASE Place 1 spray into both nostrils 2 (two) times daily as needed for allergies or rhinitis.   gabapentin 100 MG capsule Commonly known as: NEURONTIN Take 1 capsule (100 mg total) by mouth 3 (three) times daily.   hydrochlorothiazide 12.5 MG capsule Commonly known as: MICROZIDE TAKE 1 CAPSULE(12.5 MG) BY MOUTH DAILY What changed: See the new instructions.   hydrOXYzine 25 MG capsule Commonly known as: VISTARIL Take 1 capsule (25 mg total) by mouth every 8 (eight) hours as needed. What changed: reasons to take this   ipratropium-albuterol 0.5-2.5 (3) MG/3ML Soln Commonly known as: DUONEB Take 3 mLs by nebulization every 6 (six) hours as needed. What changed: reasons to take this   irbesartan 75 MG tablet Commonly known as: AVAPRO TAKE 1 TABLET(75 MG) BY MOUTH DAILY What changed: See the new instructions.   levocetirizine 5 MG tablet Commonly known as: XYZAL Take 1 tablet (5 mg total) by mouth every evening.   lidocaine 5 % Commonly known as: Lidoderm Place 1 patch onto the skin daily. Remove & Discard patch within 12 hours or as directed by MD   montelukast 10 MG tablet Commonly known as: SINGULAIR TAKE 1 TABLET(10 MG) BY MOUTH AT BEDTIME What changed: See the new instructions.   ondansetron 4 MG disintegrating tablet Commonly known as: Zofran ODT Take 1 tablet (4 mg total) by mouth every 8 (eight) hours as needed for nausea or vomiting.   pantoprazole 40 MG tablet Commonly known as: PROTONIX TAKE 1 TABLET BY MOUTH EVERY DAY   potassium chloride 10 MEQ tablet Commonly known as: KLOR-CON M Take 1 tablet (10 mEq total) by mouth daily.   predniSONE 20 MG tablet Commonly known as: DELTASONE Take 2 tablets (40 mg total)  by mouth daily with breakfast for 3 days. Start taking on: February 08, 2022   promethazine-dextromethorphan 6.25-15 MG/5ML syrup Commonly known as: PROMETHAZINE-DM Take 5 mLs by mouth 4 (four) times daily as needed for cough.   triamcinolone ointment 0.1 % Commonly known as: KENALOG Apply 1 application topically 2 (two) times daily.   UNKNOWN TO PATIENT Weekly allergy shots        Allergies  Allergen Reactions   Sulfa Antibiotics Nausea And Vomiting   Venofer [Iron Sucrose] Hives    Consultations: None   Procedures/Studies: DG Chest 2 View  Result Date: 01/09/2022 CLINICAL DATA:  Chest pain. EXAM: CHEST - 2 VIEW COMPARISON:  07/17/2021 FINDINGS: The cardiac silhouette, mediastinal and hilar contours are  normal and stable. Stable mild tortuosity and calcification of the thoracic aorta. The lungs are clear of an acute process. No pulmonary lesions or pleural effusions. The bony thorax is intact. IMPRESSION: No acute cardiopulmonary findings. Electronically Signed   By: Marijo Sanes M.D.   On: 01/09/2022 12:10   DG Chest Port 1 View  Result Date: 02/06/2022 CLINICAL DATA:  Shortness of breath. EXAM: PORTABLE CHEST 1 VIEW COMPARISON:  Lat 01/09/2022. FINDINGS: The heart size and mediastinal contours are stable with mild aortic uncoiling and calcifications. Both lungs are clear. The visualized skeletal structures are unremarkable apart from partially visible multilevel lower cervical ACDF plating. There are multiple overlying monitor wires. IMPRESSION: No active disease.  Stable chest. Electronically Signed   By: Telford Nab M.D.   On: 02/06/2022 06:06      Subjective:  Patient reports she is feeling much better today, she was ambulated in the hallway with staff for a good distance, no dyspnea, no hypoxia on room air. Discharge Exam: Vitals:   02/07/22 0752 02/07/22 0817  BP:  111/73  Pulse: 80 81  Resp: 14 19  Temp:  99.1 F (37.3 C)  SpO2: 100% 98%   Vitals:    02/07/22 0743 02/07/22 0744 02/07/22 0752 02/07/22 0817  BP:    111/73  Pulse:   80 81  Resp:   14 19  Temp:    99.1 F (37.3 C)  TempSrc:    Oral  SpO2: 100% 99% 100% 98%  Weight:      Height:        General: Pt is alert, awake, not in acute distress Cardiovascular: RRR, S1/S2 +, no rubs, no gallops Respiratory: CTA bilaterally, no rhonchi, she remains with some wheezing, but comfortable, speaking full sentences, no use of accessory muscles. Abdominal: Soft, NT, ND, bowel sounds + Extremities: no edema, no cyanosis    The results of significant diagnostics from this hospitalization (including imaging, microbiology, ancillary and laboratory) are listed below for reference.     Microbiology: Recent Results (from the past 240 hour(s))  Resp Panel by RT-PCR (Flu A&B, Covid) Nasopharyngeal Swab     Status: None   Collection Time: 02/06/22  7:37 AM   Specimen: Nasopharyngeal Swab; Nasopharyngeal(NP) swabs in vial transport medium  Result Value Ref Range Status   SARS Coronavirus 2 by RT PCR NEGATIVE NEGATIVE Final    Comment: (NOTE) SARS-CoV-2 target nucleic acids are NOT DETECTED.  The SARS-CoV-2 RNA is generally detectable in upper respiratory specimens during the acute phase of infection. The lowest concentration of SARS-CoV-2 viral copies this assay can detect is 138 copies/mL. A negative result does not preclude SARS-Cov-2 infection and should not be used as the sole basis for treatment or other patient management decisions. A negative result may occur with  improper specimen collection/handling, submission of specimen other than nasopharyngeal swab, presence of viral mutation(s) within the areas targeted by this assay, and inadequate number of viral copies(<138 copies/mL). A negative result must be combined with clinical observations, patient history, and epidemiological information. The expected result is Negative.  Fact Sheet for Patients:   EntrepreneurPulse.com.au  Fact Sheet for Healthcare Providers:  IncredibleEmployment.be  This test is no t yet approved or cleared by the [place] FDA and  has been authorized for detection and/or diagnosis of SARS-CoV-2 by FDA under an Emergency Use Authorization (EUA). This EUA will remain  in effect (meaning this test can be used) for the duration of the COVID-19 declaration under Section 564(b)(1)  of the Act, 21 U.S.C.section 360bbb-3(b)(1), unless the authorization is terminated  or revoked sooner.       Influenza A by PCR NEGATIVE NEGATIVE Final   Influenza B by PCR NEGATIVE NEGATIVE Final    Comment: (NOTE) The Xpert Xpress SARS-CoV-2/FLU/RSV plus assay is intended as an aid in the diagnosis of influenza from Nasopharyngeal swab specimens and should not be used as a sole basis for treatment. Nasal washings and aspirates are unacceptable for Xpert Xpress SARS-CoV-2/FLU/RSV testing.  Fact Sheet for Patients: EntrepreneurPulse.com.au  Fact Sheet for Healthcare Providers: IncredibleEmployment.be  This test is not yet approved or cleared by the [place] FDA and has been authorized for detection and/or diagnosis of SARS-CoV-2 by FDA under an Emergency Use Authorization (EUA). This EUA will remain in effect (meaning this test can be used) for the duration of the COVID-19 declaration under Section 564(b)(1) of the Act, 21 U.S.C. section 360bbb-3(b)(1), unless the authorization is terminated or revoked.  Performed at [facility], [place] [address], [place], [place] [address]   Respiratory (~20 pathogens) panel by PCR     Status: None   Collection Time: 02/06/22  7:37 AM   Specimen: Nasopharyngeal Swab; Respiratory  Result Value Ref Range Status   Adenovirus NOT DETECTED NOT DETECTED Final   Coronavirus 229E NOT DETECTED NOT DETECTED Final    Comment: (NOTE) The Coronavirus on the  Respiratory Panel, DOES NOT test for the novel  Coronavirus (2019 nCoV)    Coronavirus HKU1 NOT DETECTED NOT DETECTED Final   Coronavirus NL63 NOT DETECTED NOT DETECTED Final   Coronavirus OC43 NOT DETECTED NOT DETECTED Final   Metapneumovirus NOT DETECTED NOT DETECTED Final   Rhinovirus / Enterovirus NOT DETECTED NOT DETECTED Final   Influenza A NOT DETECTED NOT DETECTED Final   Influenza B NOT DETECTED NOT DETECTED Final   Parainfluenza Virus 1 NOT DETECTED NOT DETECTED Final   Parainfluenza Virus 2 NOT DETECTED NOT DETECTED Final   Parainfluenza Virus 3 NOT DETECTED NOT DETECTED Final   Parainfluenza Virus 4 NOT DETECTED NOT DETECTED Final   Respiratory Syncytial Virus NOT DETECTED NOT DETECTED Final   Bordetella pertussis NOT DETECTED NOT DETECTED Final   Bordetella Parapertussis NOT DETECTED NOT DETECTED Final   Chlamydophila pneumoniae NOT DETECTED NOT DETECTED Final   Mycoplasma pneumoniae NOT DETECTED NOT DETECTED Final    Comment: Performed at [facility], [place]. [address], [place], [place] [address]     Labs: BNP (last 3 results) Recent Labs    02/06/22 0517  BNP 85.0   Basic Metabolic Panel: Recent Labs  Lab 02/06/22 0517 02/06/22 0534 02/07/22 0246  NA 141 141 136  K 3.2* 3.2* 3.8  CL 100  --  101  CO2 31  --  25  GLUCOSE 171*  --  146*  BUN 11  --  12  CREATININE 0.68  --  0.63  CALCIUM 8.9  --  8.7*   Liver Function Tests: Recent Labs  Lab 02/06/22 0517  AST 21  ALT 19  ALKPHOS 78  BILITOT 0.2*  PROT 6.5  ALBUMIN 3.9   No results for input(s): LIPASE, AMYLASE in the last 168 hours. No results for input(s): AMMONIA in the last 168 hours. CBC: Recent Labs  Lab 02/06/22 0517 02/06/22 0534 02/07/22 0246  WBC 12.3*  --  15.5*  HGB 12.1 12.6 10.9*  HCT 36.7 37.0 32.8*  MCV 96.6  --  95.1  PLT 351  --  314  Cardiac Enzymes: No results for input(s): CKTOTAL, CKMB, CKMBINDEX, TROPONINI in the last 168 hours. BNP: Invalid input(s):  POCBNP CBG: Recent Labs  Lab 02/06/22 2216 02/07/22 0817  GLUCAP 159* 121*   D-Dimer No results for input(s): DDIMER in the last 72 hours. Hgb A1c Recent Labs    02/06/22 1801  HGBA1C 5.6   Lipid Profile No results for input(s): CHOL, HDL, LDLCALC, TRIG, CHOLHDL, LDLDIRECT in the last 72 hours. Thyroid function studies No results for input(s): TSH, T4TOTAL, T3FREE, THYROIDAB in the last 72 hours.  Invalid input(s): FREET3 Anemia work up No results for input(s): VITAMINB12, FOLATE, FERRITIN, TIBC, IRON, RETICCTPCT in the last 72 hours. Urinalysis    Component Value Date/Time   COLORURINE YELLOW 10/11/2021 [place] 10/11/2021 1442   LABSPEC 1.010 10/11/2021 1442   PHURINE 5.5 10/11/2021 1442   GLUCOSEU NEGATIVE 10/11/2021 1442   HGBUR NEGATIVE 10/11/2021 1442   BILIRUBINUR NEGATIVE 10/11/2021 1442   KETONESUR NEGATIVE 10/11/2021 1442   PROTEINUR NEGATIVE 05/05/2021 1922   UROBILINOGEN 0.2 10/11/2021 1442   NITRITE NEGATIVE 10/11/2021 1442   LEUKOCYTESUR NEGATIVE 10/11/2021 1442   Sepsis Labs Invalid input(s): PROCALCITONIN,  WBC,  LACTICIDVEN Microbiology Recent Results (from the past 240 hour(s))  Resp Panel by RT-PCR (Flu A&B, Covid) Nasopharyngeal Swab     Status: None   Collection Time: 02/06/22  7:37 AM   Specimen: Nasopharyngeal Swab; Nasopharyngeal(NP) swabs in vial transport medium  Result Value Ref Range Status   SARS Coronavirus 2 by RT PCR NEGATIVE NEGATIVE Final    Comment: (NOTE) SARS-CoV-2 target nucleic acids are NOT DETECTED.  The SARS-CoV-2 RNA is generally detectable in upper respiratory specimens during the acute phase of infection. The lowest concentration of SARS-CoV-2 viral copies this assay can detect is 138 copies/mL. A negative result does not preclude SARS-Cov-2 infection and should not be used as the sole basis for treatment or other patient management decisions. A negative result may occur with  improper specimen  collection/handling, submission of specimen other than nasopharyngeal swab, presence of viral mutation(s) within the areas targeted by this assay, and inadequate number of viral copies(<138 copies/mL). A negative result must be combined with clinical observations, patient history, and epidemiological information. The expected result is Negative.  Fact Sheet for Patients:  EntrepreneurPulse.com.au  Fact Sheet for Healthcare Providers:  IncredibleEmployment.be  This test is no t yet approved or cleared by the [place] FDA and  has been authorized for detection and/or diagnosis of SARS-CoV-2 by FDA under an Emergency Use Authorization (EUA). This EUA will remain  in effect (meaning this test can be used) for the duration of the COVID-19 declaration under Section 564(b)(1) of the Act, 21 U.S.C.section 360bbb-3(b)(1), unless the authorization is terminated  or revoked sooner.       Influenza A by PCR NEGATIVE NEGATIVE Final   Influenza B by PCR NEGATIVE NEGATIVE Final    Comment: (NOTE) The Xpert Xpress SARS-CoV-2/FLU/RSV plus assay is intended as an aid in the diagnosis of influenza from Nasopharyngeal swab specimens and should not be used as a sole basis for treatment. Nasal washings and aspirates are unacceptable for Xpert Xpress SARS-CoV-2/FLU/RSV testing.  Fact Sheet for Patients: EntrepreneurPulse.com.au  Fact Sheet for Healthcare Providers: IncredibleEmployment.be  This test is not yet approved or cleared by the [place] FDA and has been authorized for detection and/or diagnosis of SARS-CoV-2 by FDA under an Emergency Use Authorization (EUA). This EUA will remain in effect (meaning this test can be used)  for the duration of the COVID-19 declaration under Section 564(b)(1) of the Act, 21 U.S.C. section 360bbb-3(b)(1), unless the authorization is terminated or revoked.  Performed at [facility], [place] [address], [place], [place] [address]   Respiratory (~20 pathogens) panel by PCR     Status: None   Collection Time: 02/06/22  7:37 AM   Specimen: Nasopharyngeal Swab; Respiratory  Result Value Ref Range Status   Adenovirus NOT DETECTED NOT DETECTED Final   Coronavirus 229E NOT DETECTED NOT DETECTED Final    Comment: (NOTE) The Coronavirus on the Respiratory Panel, DOES NOT test for the novel  Coronavirus (2019 nCoV)    Coronavirus HKU1 NOT DETECTED NOT DETECTED Final   Coronavirus NL63 NOT DETECTED NOT DETECTED Final   Coronavirus OC43 NOT DETECTED NOT DETECTED Final   Metapneumovirus NOT DETECTED NOT DETECTED Final   Rhinovirus / Enterovirus NOT DETECTED NOT DETECTED Final   Influenza A NOT DETECTED NOT DETECTED Final   Influenza B NOT DETECTED NOT DETECTED Final   Parainfluenza Virus 1 NOT DETECTED NOT DETECTED Final   Parainfluenza Virus 2 NOT DETECTED NOT DETECTED Final   Parainfluenza Virus 3 NOT DETECTED NOT DETECTED Final   Parainfluenza Virus 4 NOT DETECTED NOT DETECTED Final   Respiratory Syncytial Virus NOT DETECTED NOT DETECTED Final   Bordetella pertussis NOT DETECTED NOT DETECTED Final   Bordetella Parapertussis NOT DETECTED NOT DETECTED Final   Chlamydophila pneumoniae NOT DETECTED NOT DETECTED Final   Mycoplasma pneumoniae NOT DETECTED NOT DETECTED Final    Comment: Performed at [facility], [place]. [address], [place], [place] [address]     Time coordinating discharge: 30 minutes  SIGNED:   Phillips Climes, MD  [facility] 02/07/2022, 11:32 AM Pager   If 7PM-7AM, please contact night-coverage [email or web address] Password [email or web address]

## 2022-02-12 ENCOUNTER — Other Ambulatory Visit: Payer: Self-pay

## 2022-02-12 ENCOUNTER — Telehealth: Payer: Self-pay | Admitting: *Deleted

## 2022-02-12 ENCOUNTER — Ambulatory Visit (INDEPENDENT_AMBULATORY_CARE_PROVIDER_SITE_OTHER): Payer: Medicare HMO

## 2022-02-12 DIAGNOSIS — J4550 Severe persistent asthma, uncomplicated: Secondary | ICD-10-CM

## 2022-02-12 NOTE — Telephone Encounter (Signed)
Additional information has been filled out and has been faxed to patients [facility] for attempted approval of CT of the Chest.

## 2022-02-12 NOTE — Telephone Encounter (Signed)
Error

## 2022-02-20 NOTE — Telephone Encounter (Signed)
CT scan has been approved. Approval number [id number], CT is good until 03/08/22. Patient is already scheduled to receive her CT scan that day.  ?

## 2022-02-26 ENCOUNTER — Ambulatory Visit (INDEPENDENT_AMBULATORY_CARE_PROVIDER_SITE_OTHER): Payer: Medicare HMO

## 2022-02-26 ENCOUNTER — Other Ambulatory Visit: Payer: Self-pay

## 2022-02-26 DIAGNOSIS — J4550 Severe persistent asthma, uncomplicated: Secondary | ICD-10-CM | POA: Diagnosis not present

## 2022-03-08 ENCOUNTER — Ambulatory Visit (INDEPENDENT_AMBULATORY_CARE_PROVIDER_SITE_OTHER): Payer: Medicare HMO

## 2022-03-08 ENCOUNTER — Other Ambulatory Visit: Payer: Self-pay

## 2022-03-08 ENCOUNTER — Ambulatory Visit
Admission: RE | Admit: 2022-03-08 | Discharge: 2022-03-08 | Disposition: A | Discharge status: Home / Self Care | Payer: Medicare HMO | Source: Ambulatory Visit | Attending: Family Medicine | Admitting: Family Medicine

## 2022-03-08 ENCOUNTER — Telehealth: Payer: Self-pay

## 2022-03-08 DIAGNOSIS — J309 Allergic rhinitis, unspecified: Secondary | ICD-10-CM | POA: Diagnosis not present

## 2022-03-08 DIAGNOSIS — J439 Emphysema, unspecified: Secondary | ICD-10-CM | POA: Diagnosis not present

## 2022-03-08 DIAGNOSIS — R053 Chronic cough: Secondary | ICD-10-CM

## 2022-03-08 DIAGNOSIS — I700 Atherosclerosis of aorta: Secondary | ICD-10-CM | POA: Diagnosis not present
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
## 2022-03-08 NOTE — Telephone Encounter (Signed)
Patient came in to get her allergy injections and picked up samples. Patient was very appreciative for the samples.   ?

## 2022-03-12 ENCOUNTER — Ambulatory Visit: Payer: Medicare HMO

## 2022-03-12 NOTE — Progress Notes (Signed)
Can you please let this patient know that there was no acute abnormality noted on her chest CT scan. Please ask how she has been breathing. Thank you

## 2022-03-13 ENCOUNTER — Other Ambulatory Visit: Payer: Self-pay

## 2022-03-13 ENCOUNTER — Ambulatory Visit (INDEPENDENT_AMBULATORY_CARE_PROVIDER_SITE_OTHER): Payer: Medicare HMO

## 2022-03-13 DIAGNOSIS — J4550 Severe persistent asthma, uncomplicated: Secondary | ICD-10-CM

## 2022-03-21 ENCOUNTER — Ambulatory Visit (HOSPITAL_COMMUNITY): Payer: Medicare HMO | Admitting: Psychiatry

## 2022-03-23 ENCOUNTER — Telehealth (HOSPITAL_BASED_OUTPATIENT_CLINIC_OR_DEPARTMENT_OTHER): Payer: Medicare HMO | Admitting: Psychiatry

## 2022-03-23 DIAGNOSIS — F3110 Bipolar disorder, current episode manic without psychotic features, unspecified: Secondary | ICD-10-CM | POA: Diagnosis not present

## 2022-03-23 MED ORDER — GABAPENTIN 100 MG PO CAPS: 100.0000 mg | ORAL_CAPSULE | Freq: Three times a day (TID) | ORAL | 3 refills | Status: DC

## 2022-03-23 MED ORDER — HYDROXYZINE PAMOATE 25 MG PO CAPS: ORAL_CAPSULE | ORAL | 6 refills | Status: DC

## 2022-03-23 MED ORDER — CARBAMAZEPINE 200 MG PO TABS: 400.0000 mg | ORAL_TABLET | Freq: Two times a day (BID) | ORAL | 12 refills | Status: DC

## 2022-03-23 MED ORDER — BELSOMRA 15 MG PO TABS
15.0000 mg | ORAL_TABLET | Freq: Every day | ORAL | 3 refills | Status: DC
Start: 2022-03-23 — End: 2022-05-20
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
## 2022-03-27 ENCOUNTER — Ambulatory Visit: Payer: Medicare HMO

## 2022-03-28 ENCOUNTER — Ambulatory Visit (INDEPENDENT_AMBULATORY_CARE_PROVIDER_SITE_OTHER): Payer: Medicare HMO | Admitting: *Deleted

## 2022-03-28 DIAGNOSIS — J4550 Severe persistent asthma, uncomplicated: Secondary | ICD-10-CM

## 2022-03-28 DIAGNOSIS — J309 Allergic rhinitis, unspecified: Secondary | ICD-10-CM

## 2022-03-28 NOTE — Progress Notes (Signed)
VIAL EXP 03-29-23 ?

## 2022-03-29 DIAGNOSIS — J3089 Other allergic rhinitis: Secondary | ICD-10-CM | POA: Diagnosis not present

## 2022-04-06 ENCOUNTER — Telehealth: Payer: Self-pay | Admitting: Emergency Medicine

## 2022-04-06 ENCOUNTER — Other Ambulatory Visit: Payer: Self-pay | Admitting: Emergency Medicine

## 2022-04-06 NOTE — Telephone Encounter (Signed)
Please call to make a follow up for this patient as she has not been seen in 2021. She will need to make a follow up to get her refills.  ?

## 2022-04-11 ENCOUNTER — Ambulatory Visit (INDEPENDENT_AMBULATORY_CARE_PROVIDER_SITE_OTHER): Payer: Medicare HMO | Admitting: *Deleted

## 2022-04-11 ENCOUNTER — Ambulatory Visit (INDEPENDENT_AMBULATORY_CARE_PROVIDER_SITE_OTHER): Payer: Medicare HMO | Admitting: Internal Medicine

## 2022-04-11 VITALS — BP 122/68 | HR 63 | Temp 98.7°F | Ht 63.0 in | Wt 153.0 lb

## 2022-04-11 DIAGNOSIS — J4541 Moderate persistent asthma with (acute) exacerbation: Secondary | ICD-10-CM | POA: Diagnosis not present

## 2022-04-11 DIAGNOSIS — I10 Essential (primary) hypertension: Secondary | ICD-10-CM | POA: Diagnosis not present

## 2022-04-11 DIAGNOSIS — J309 Allergic rhinitis, unspecified: Secondary | ICD-10-CM

## 2022-04-11 DIAGNOSIS — H900 Conductive hearing loss, bilateral: Secondary | ICD-10-CM

## 2022-04-11 DIAGNOSIS — R7303 Prediabetes: Secondary | ICD-10-CM

## 2022-04-11 DIAGNOSIS — J4550 Severe persistent asthma, uncomplicated: Secondary | ICD-10-CM

## 2022-04-11 MED ORDER — ALBUTEROL SULFATE HFA 108 (90 BASE) MCG/ACT IN AERS: 2.0000 | INHALATION_SPRAY | RESPIRATORY_TRACT | 11 refills | Status: DC | PRN

## 2022-04-11 MED ORDER — PREDNISONE 10 MG PO TABS: ORAL_TABLET | ORAL | 0 refills | Status: DC

## 2022-04-11 MED ORDER — METHYLPREDNISOLONE ACETATE 80 MG/ML IJ SUSP
80.0000 mg | Freq: Once | INTRAMUSCULAR | Status: AC
  Administered 2022-04-11: 80 mg via INTRAMUSCULAR

## 2022-04-11 MED ORDER — PREDNISONE 10 MG PO TABS: 10.0000 mg | ORAL_TABLET | Freq: Every day | ORAL | 0 refills | Status: DC

## 2022-04-11 NOTE — Patient Instructions (Addendum)
You had the steroid shot today ? ?Please take all new medication as prescribed - the prednisone taper, BUT then take 10 mg per day until you see Dr Lamonte Sakai in late May 2023 ? ?Your ears were irrigated of wax today ? ?Please continue all other medications as before, including the inhaler ? ?Please have the pharmacy call with any other refills you may need. ? ?Please continue your efforts at being more active, low cholesterol diet, and weight control. ? ?Please keep your appointments with your specialists as you may have planned  - Dr Lamonte Sakai May 26 ? ? ? ? ? ?

## 2022-04-14 ENCOUNTER — Encounter: Payer: Self-pay | Admitting: Internal Medicine

## 2022-04-14 DIAGNOSIS — H9193 Unspecified hearing loss, bilateral: Secondary | ICD-10-CM | POA: Insufficient documentation

## 2022-04-14 NOTE — Assessment & Plan Note (Signed)
bialteral wax impactions resolved with irrigation and hearing improved ? ?Ceruminosis is noted.  Wax is removed by syringing and manual debridement. Instructions for home care to prevent wax buildup are given. ? ?

## 2022-04-14 NOTE — Assessment & Plan Note (Signed)
Lab Results  ?Component Value Date  ? HGBA1C 5.6 02/06/2022  ? ?Stable, pt to continue current medical treatment  - diet ? ?

## 2022-04-14 NOTE — Assessment & Plan Note (Signed)
BP Readings from Last 3 Encounters:  ?04/11/22 122/68  ?02/07/22 111/73  ?02/02/22 122/70  ? ?Stable, pt to continue medical treatment avapro, hct ? ?

## 2022-04-14 NOTE — Assessment & Plan Note (Signed)
Mild to mod, for depomedrol im 80, prednisone taper but also then prednisone 10 qd until see pulm as planned may 2023,  to f/u any worsening symptoms or concerns ?

## 2022-04-14 NOTE — Progress Notes (Signed)
Patient ID: [name], female   DOB: [birth year], 61 y.o.   MRN: [id number] ? ? ? ?    Chief Complaint: follow up asthma exacerbation, bilateral hearing loss, htn, hyperglycemia ? ?     HPI:  [name] is a 61 y.o. female here with c/o 3 days gradually increased wheezing sob as per several prior asthma exacerbations in the past year, with controller meds not working well it seems.  Pt denies chest pain, orthopnea, PND, increased LE swelling, palpitations, dizziness or syncope.  Pt denies fever, wt loss, night sweats, loss of appetite, or other constitutional symptoms   Pt denies polydipsia, polyuria, or new focal neuro s/s. Does also have reduced hearing bilateral with hx of recurring wax impactions.  Denies HA, ear pain, ST, chills.   ?      ?Wt Readings from Last 3 Encounters:  ?04/11/22 153 lb (69.4 kg)  ?02/06/22 154 lb 15.7 oz (70.3 kg)  ?02/02/22 158 lb 6.4 oz (71.8 kg)  ? ?BP Readings from Last 3 Encounters:  ?04/11/22 122/68  ?02/07/22 111/73  ?02/02/22 122/70  ? ?      ?Past Medical History:  ?Diagnosis Date  ? Anemia, iron deficiency 12/22/2014  ? pt. denies  ? Anxiety   ? Arthritis   ? ASTHMA 05/12/2009  ? 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     ? Bipolar disorder ([place])   ? COPD (chronic obstructive pulmonary disease) ([place])   ? Depression   ? Fibromyalgia 05/14/2014  ? GERD (gastroesophageal reflux disease)   ? History of kidney stones   ? Hyperlipidemia 04/20/2017  ? HYPERTENSION 05/12/2009  ? Qualifier: Diagnosis of  By: Lenn Cal Deborra Medina), Wynona Canes    ? Kidney stones   ? Peripheral vascular disease ([place])   ? Pneumonia   ? Prediabetes 02/23/2014  ? pt. denies  ? Seizure ([place])   ? Stroke [facility]) 11/2020  ? Urticaria   ? ?Past Surgical History:  ?Procedure Laterality Date  ? ABDOMINAL HYSTERECTOMY    ? ANTERIOR CERVICAL  DECOMP/DISCECTOMY FUSION N/A 07/28/2020  ? Procedure: ANTERIOR CERVICAL DECOMPRESSION/DISCECTOMY FUSION. INTERBODY PROTHESIS, PLATE/SCREWS CERVICAL THREE- CERVICAL FOUR, CERVICAL FOUR- CERVICAL FIVE;  Surgeon: Newman Pies, MD;  Location: [facility];  Service: Neurosurgery;  Laterality: N/A;  ? BACK SURGERY    ? COLONOSCOPY  12/20/2011  ? Procedure: COLONOSCOPY;  Surgeon: Landry Dyke, MD;  [facility];  Service: Endoscopy;  Laterality: N/A;  ? COLONOSCOPY  03/05/2012  ? Procedure: COLONOSCOPY;  Surgeon: Landry Dyke, MD;  [facility];  Service: Endoscopy;  Laterality: N/A;  ? DIAGNOSTIC LAPAROSCOPY    ? HEMORRHOID SURGERY    ? INCISE AND DRAIN ABCESS    ? KIDNEY STONE SURGERY    ? NECK SURGERY    ? x 2 Dr Orinda Kenner  ? SPINE SURGERY  2019  ? TOE SURGERY    ? TUBAL LIGATION    ? ? reports that she quit smoking about 3 years ago. Her smoking use included cigarettes. She has a 9.00 pack-year smoking history. She has never used smokeless tobacco. She reports that she does not currently use alcohol. She reports current drug use. Drug: Marijuana. ?family history includes Alcohol abuse in her brother and father; Anemia in her daughter, sister, and sister; Anesthesia problems in her father; Asthma in her brother, mother, and sister;  Brain cancer in her sister; COPD in her mother and sister; Colon cancer (age of onset: 98) in her father; Diabetes in her brother, father, mother, sister, sister, sister, and sister; Heart disease in her brother, brother, mother, and sister; Hypertension in her brother, daughter, sister, sister, sister, sister, and sister; Kidney disease in her father; Sleep apnea in her brother. ?Allergies  ?Allergen Reactions  ? Sulfa Antibiotics Nausea And Vomiting  ? Venofer [Iron Sucrose] Hives  ? ?Current Outpatient Medications on File Prior to Visit  ?Medication Sig Dispense Refill  ? acetaminophen (TYLENOL) 325 MG tablet Take 2 tablets (650 mg total) by mouth every 6 (six) hours  as needed for mild pain (or Fever >/= 101).    ? albuterol (PROVENTIL) (2.5 MG/3ML) 0.083% nebulizer solution Take 3 mLs (2.5 mg total) by nebulization every 6 (six) hours as needed for wheezing or shortness of breath. Reported on 06/04/2016 120 mL 11  ? atorvastatin (LIPITOR) 40 MG tablet TAKE 1 TABLET(40 MG) BY MOUTH DAILY (Patient taking differently: Take 40 mg by mouth daily.) 90 tablet 1  ? Biotin 1000 MCG tablet Take 1,000 mcg by mouth daily.    ? Budeson-Glycopyrrol-Formoterol (BREZTRI AEROSPHERE) 160-9-4.8 MCG/ACT AERO INHALE 2 PUFFS INTO THE LUNGS TWICE DAILY WITH SPACER (Patient taking differently: Inhale 2 puffs into the lungs 2 (two) times daily. WITH SPACER) 10.7 g 5  ? carbamazepine (TEGRETOL) 200 MG tablet Take 2 tablets (400 mg total) by mouth 2 (two) times daily. 120 tablet 12  ? cetirizine (ZYRTEC) 10 MG tablet Can take one tablet by mouth once daily if needed for runny nose. 30 tablet 5  ? cyclobenzaprine (FLEXERIL) 5 MG tablet TAKE 1 TABLET(5 MG) BY MOUTH THREE TIMES DAILY AS NEEDED FOR MUSCLE SPASMS (Patient taking differently: Take 5 mg by mouth 3 (three) times daily as needed for muscle spasms.) 60 tablet 5  ? diclofenac Sodium (VOLTAREN) 1 % GEL Apply 4 g topically 4 (four) times daily. (Patient taking differently: Apply 4 g topically 4 (four) times daily as needed (pain).) 100 g 0  ? estradiol (ESTRACE) 0.5 MG tablet Take 1 tablet (0.5 mg total) by mouth daily. 90 tablet 4  ? famotidine (PEPCID) 20 MG tablet Take 20 mg by mouth daily as needed for heartburn or indigestion. OTC    ? fluticasone (FLONASE) 50 MCG/ACT nasal spray Place 1 spray into both nostrils 2 (two) times daily as needed for allergies or rhinitis. 16 g 5  ? gabapentin (NEURONTIN) 100 MG capsule Take 1 capsule (100 mg total) by mouth 3 (three) times daily. 90 capsule 3  ? hydrochlorothiazide (MICROZIDE) 12.5 MG capsule TAKE 1 CAPSULE(12.5 MG) BY MOUTH DAILY (Patient taking differently: Take 12.5 mg by mouth daily.) 90 capsule  1  ? hydrOXYzine (VISTARIL) 25 MG capsule Take 1 when angry and if not calmer in 1 hour repeat 45 capsule 6  ? ipratropium (ATROVENT HFA) 17 MCG/ACT inhaler Inhale 2 puffs into the lungs every 6 (six) hours as needed for wheezing. 1 each 12  ? ipratropium-albuterol (DUONEB) 0.5-2.5 (3) MG/3ML SOLN Take 3 mLs by nebulization every 6 (six) hours as needed. (Patient taking differently: Take 3 mLs by nebulization every 6 (six) hours as needed (wheezing).) 60 mL 0  ? irbesartan (AVAPRO) 75 MG tablet TAKE 1 TABLET(75 MG) BY MOUTH DAILY (Patient taking differently: Take 75 mg by mouth daily.) 90 tablet 1  ? levocetirizine (XYZAL) 5 MG tablet Take 1 tablet (5 mg total) by mouth every evening. 30 tablet  5  ? lidocaine (LIDODERM) 5 % Place 1 patch onto the skin daily. Remove & Discard patch within 12 hours or as directed by MD 30 patch 0  ? montelukast (SINGULAIR) 10 MG tablet TAKE 1 TABLET(10 MG) BY MOUTH AT BEDTIME (Patient taking differently: Take 10 mg by mouth at bedtime.) 30 tablet 0  ? ondansetron (ZOFRAN ODT) 4 MG disintegrating tablet Take 1 tablet (4 mg total) by mouth every 8 (eight) hours as needed for nausea or vomiting. 20 tablet 0  ? pantoprazole (PROTONIX) 40 MG tablet TAKE 1 TABLET BY MOUTH EVERY DAY 90 tablet 1  ? potassium chloride (KLOR-CON) 10 MEQ tablet Take 1 tablet (10 mEq total) by mouth daily. 90 tablet 3  ? promethazine-dextromethorphan (PROMETHAZINE-DM) 6.25-15 MG/5ML syrup Take 5 mLs by mouth 4 (four) times daily as needed for cough. 118 mL 1  ? Suvorexant (BELSOMRA) 15 MG TABS Take 15 mg by mouth at bedtime. 30 tablet 3  ? triamcinolone ointment (KENALOG) 0.1 % Apply 1 application topically 2 (two) times daily. 454 g 1  ? UNKNOWN TO PATIENT Weekly allergy shots    ? ?Current Facility-Administered Medications on File Prior to Visit  ?Medication Dose Route Frequency Provider Last Rate Last Admin  ? dupilumab (DUPIXENT) prefilled syringe 300 mg  300 mg Subcutaneous Q14 Days Valentina Shaggy, MD    300 mg at 04/11/22 1039  ? ?     ROS:  All others reviewed and negative. ? ?Objective  ? ?     PE:  BP 122/68 (BP Location: Right Arm, Patient Position: Sitting, Cuff Size: Large)   Pulse 63   Temp 98.7 ?F

## 2022-04-25 ENCOUNTER — Ambulatory Visit (HOSPITAL_COMMUNITY): Payer: Medicare HMO | Admitting: Psychiatry

## 2022-04-25 ENCOUNTER — Ambulatory Visit: Payer: Medicare HMO

## 2022-04-25 ENCOUNTER — Ambulatory Visit (INDEPENDENT_AMBULATORY_CARE_PROVIDER_SITE_OTHER): Payer: Medicare HMO

## 2022-04-25 DIAGNOSIS — J309 Allergic rhinitis, unspecified: Secondary | ICD-10-CM

## 2022-04-30 ENCOUNTER — Emergency Department (HOSPITAL_COMMUNITY): Payer: Medicare HMO

## 2022-04-30 ENCOUNTER — Other Ambulatory Visit: Payer: Self-pay

## 2022-04-30 ENCOUNTER — Encounter (HOSPITAL_COMMUNITY): Payer: Self-pay | Admitting: Emergency Medicine

## 2022-04-30 ENCOUNTER — Inpatient Hospital Stay (HOSPITAL_COMMUNITY)
Admission: EM | Admit: 2022-04-30 | Discharge: 2022-05-02 | DRG: 202 | Disposition: A | Discharge status: Home Health | Payer: Medicare HMO | Attending: Internal Medicine | Admitting: Internal Medicine

## 2022-04-30 DIAGNOSIS — Z882 Allergy status to sulfonamides status: Secondary | ICD-10-CM | POA: Diagnosis not present

## 2022-04-30 DIAGNOSIS — J45902 Unspecified asthma with status asthmaticus: Secondary | ICD-10-CM | POA: Diagnosis present

## 2022-04-30 DIAGNOSIS — J9601 Acute respiratory failure with hypoxia: Secondary | ICD-10-CM | POA: Diagnosis not present

## 2022-04-30 DIAGNOSIS — G40909 Epilepsy, unspecified, not intractable, without status epilepticus: Secondary | ICD-10-CM | POA: Diagnosis not present

## 2022-04-30 DIAGNOSIS — Z889 Allergy status to unspecified drugs, medicaments and biological substances status: Secondary | ICD-10-CM

## 2022-04-30 DIAGNOSIS — E785 Hyperlipidemia, unspecified: Secondary | ICD-10-CM | POA: Diagnosis present

## 2022-04-30 DIAGNOSIS — I10 Essential (primary) hypertension: Secondary | ICD-10-CM | POA: Diagnosis not present

## 2022-04-30 DIAGNOSIS — Z800 Family history of malignant neoplasm of digestive organs: Secondary | ICD-10-CM | POA: Diagnosis not present

## 2022-04-30 DIAGNOSIS — Z833 Family history of diabetes mellitus: Secondary | ICD-10-CM | POA: Diagnosis not present

## 2022-04-30 DIAGNOSIS — Z87891 Personal history of nicotine dependence: Secondary | ICD-10-CM | POA: Diagnosis not present

## 2022-04-30 DIAGNOSIS — Z808 Family history of malignant neoplasm of other organs or systems: Secondary | ICD-10-CM

## 2022-04-30 DIAGNOSIS — K219 Gastro-esophageal reflux disease without esophagitis: Secondary | ICD-10-CM | POA: Diagnosis not present

## 2022-04-30 DIAGNOSIS — I700 Atherosclerosis of aorta: Secondary | ICD-10-CM | POA: Diagnosis present

## 2022-04-30 DIAGNOSIS — Z8673 Personal history of transient ischemic attack (TIA), and cerebral infarction without residual deficits: Secondary | ICD-10-CM | POA: Diagnosis not present

## 2022-04-30 DIAGNOSIS — J449 Chronic obstructive pulmonary disease, unspecified: Secondary | ICD-10-CM | POA: Diagnosis present

## 2022-04-30 DIAGNOSIS — J9611 Chronic respiratory failure with hypoxia: Secondary | ICD-10-CM | POA: Diagnosis present

## 2022-04-30 DIAGNOSIS — Z811 Family history of alcohol abuse and dependence: Secondary | ICD-10-CM

## 2022-04-30 DIAGNOSIS — R0989 Other specified symptoms and signs involving the circulatory and respiratory systems: Secondary | ICD-10-CM | POA: Diagnosis not present

## 2022-04-30 DIAGNOSIS — J4552 Severe persistent asthma with status asthmaticus: Secondary | ICD-10-CM | POA: Diagnosis not present

## 2022-04-30 DIAGNOSIS — J441 Chronic obstructive pulmonary disease with (acute) exacerbation: Secondary | ICD-10-CM | POA: Diagnosis not present

## 2022-04-30 DIAGNOSIS — J4489 Other specified chronic obstructive pulmonary disease: Secondary | ICD-10-CM | POA: Diagnosis present

## 2022-04-30 DIAGNOSIS — J4540 Moderate persistent asthma, uncomplicated: Secondary | ICD-10-CM

## 2022-04-30 DIAGNOSIS — R0603 Acute respiratory distress: Secondary | ICD-10-CM | POA: Diagnosis not present

## 2022-04-30 DIAGNOSIS — Z888 Allergy status to other drugs, medicaments and biological substances status: Secondary | ICD-10-CM | POA: Diagnosis not present

## 2022-04-30 DIAGNOSIS — Z7951 Long term (current) use of inhaled steroids: Secondary | ICD-10-CM

## 2022-04-30 DIAGNOSIS — J309 Allergic rhinitis, unspecified: Secondary | ICD-10-CM | POA: Diagnosis present

## 2022-04-30 DIAGNOSIS — Z981 Arthrodesis status: Secondary | ICD-10-CM | POA: Diagnosis not present

## 2022-04-30 DIAGNOSIS — Z825 Family history of asthma and other chronic lower respiratory diseases: Secondary | ICD-10-CM | POA: Diagnosis not present

## 2022-04-30 DIAGNOSIS — Z79899 Other long term (current) drug therapy: Secondary | ICD-10-CM | POA: Diagnosis not present

## 2022-04-30 DIAGNOSIS — R0602 Shortness of breath: Secondary | ICD-10-CM | POA: Diagnosis not present

## 2022-04-30 DIAGNOSIS — J9602 Acute respiratory failure with hypercapnia: Secondary | ICD-10-CM | POA: Diagnosis present

## 2022-04-30 DIAGNOSIS — R0689 Other abnormalities of breathing: Secondary | ICD-10-CM | POA: Diagnosis not present

## 2022-04-30 DIAGNOSIS — I739 Peripheral vascular disease, unspecified: Secondary | ICD-10-CM | POA: Diagnosis not present

## 2022-04-30 DIAGNOSIS — R062 Wheezing: Secondary | ICD-10-CM | POA: Diagnosis not present

## 2022-04-30 DIAGNOSIS — Z8249 Family history of ischemic heart disease and other diseases of the circulatory system: Secondary | ICD-10-CM

## 2022-04-30 DIAGNOSIS — Z841 Family history of disorders of kidney and ureter: Secondary | ICD-10-CM

## 2022-04-30 DIAGNOSIS — M797 Fibromyalgia: Secondary | ICD-10-CM | POA: Diagnosis present

## 2022-04-30 DIAGNOSIS — R000 Tachycardia, unspecified: Secondary | ICD-10-CM | POA: Diagnosis not present

## 2022-04-30 DIAGNOSIS — M1990 Unspecified osteoarthritis, unspecified site: Secondary | ICD-10-CM | POA: Diagnosis present

## 2022-04-30 DIAGNOSIS — R457 State of emotional shock and stress, unspecified: Secondary | ICD-10-CM | POA: Diagnosis not present

## 2022-04-30 DIAGNOSIS — M545 Low back pain, unspecified: Secondary | ICD-10-CM

## 2022-04-30 LAB — CBC WITH DIFFERENTIAL/PLATELET
Abs Immature Granulocytes: 0.08 10*3/uL — ABNORMAL HIGH (ref 0.00–0.07)
Basophils Absolute: 0.1 10*3/uL (ref 0.0–0.1)
Basophils Relative: 0 %
Eosinophils Absolute: 0.1 10*3/uL (ref 0.0–0.5)
Eosinophils Relative: 1 %
HCT: 31.9 % — ABNORMAL LOW (ref 36.0–46.0)
Hemoglobin: 9.8 g/dL — ABNORMAL LOW (ref 12.0–15.0)
Immature Granulocytes: 1 %
Lymphocytes Relative: 32 %
Lymphs Abs: 5.2 10*3/uL — ABNORMAL HIGH (ref 0.7–4.0)
MCH: 27.4 pg (ref 26.0–34.0)
MCHC: 30.7 g/dL (ref 30.0–36.0)
MCV: 89.1 fL (ref 80.0–100.0)
Monocytes Absolute: 1.5 10*3/uL — ABNORMAL HIGH (ref 0.1–1.0)
Monocytes Relative: 9 %
Neutro Abs: 9.3 10*3/uL — ABNORMAL HIGH (ref 1.7–7.7)
Neutrophils Relative %: 57 %
Platelets: 378 10*3/uL (ref 150–400)
RBC: 3.58 MIL/uL — ABNORMAL LOW (ref 3.87–5.11)
RDW: 14.7 % (ref 11.5–15.5)
WBC: 16.2 10*3/uL — ABNORMAL HIGH (ref 4.0–10.5)
nRBC: 0 % (ref 0.0–0.2)

## 2022-04-30 LAB — RESPIRATORY PANEL BY PCR

## 2022-04-30 LAB — HIV ANTIBODY (ROUTINE TESTING W REFLEX): HIV Screen 4th Generation wRfx: NONREACTIVE

## 2022-04-30 LAB — COMPREHENSIVE METABOLIC PANEL
ALT: 21 U/L (ref 0–44)
AST: 21 U/L (ref 15–41)
Albumin: 3.8 g/dL (ref 3.5–5.0)
Alkaline Phosphatase: 71 U/L (ref 38–126)
Anion gap: 8 (ref 5–15)
BUN: 10 mg/dL (ref 6–20)
CO2: 25 mmol/L (ref 22–32)
Calcium: 8.7 mg/dL — ABNORMAL LOW (ref 8.9–10.3)
Chloride: 105 mmol/L (ref 98–111)
Creatinine, Ser: 0.78 mg/dL (ref 0.44–1.00)
GFR, Estimated: >60 mL/min (ref >60)
Glucose, Bld: 139 mg/dL — ABNORMAL HIGH (ref 70–99)
Potassium: 3.9 mmol/L (ref 3.5–5.1)
Sodium: 138 mmol/L (ref 135–145)
Total Bilirubin: 0.5 mg/dL (ref 0.3–1.2)
Total Protein: 6.5 g/dL (ref 6.5–8.1)

## 2022-04-30 LAB — I-STAT ARTERIAL BLOOD GAS, ED
Acid-Base Excess: 1 mmol/L (ref 0.0–2.0)
Bicarbonate: 27.9 mmol/L (ref 20.0–28.0)
Calcium, Ion: 1.17 mmol/L (ref 1.15–1.40)
HCT: 31 % — ABNORMAL LOW (ref 36.0–46.0)
Hemoglobin: 10.5 g/dL — ABNORMAL LOW (ref 12.0–15.0)
O2 Saturation: 99 %
Patient temperature: 98.6
Potassium: 3.6 mmol/L (ref 3.5–5.1)
Sodium: 136 mmol/L (ref 135–145)
TCO2: 30 mmol/L (ref 22–32)
pCO2 arterial: 53.6 mmHg — ABNORMAL HIGH (ref 32–48)
pH, Arterial: 7.324 — ABNORMAL LOW (ref 7.35–7.45)
pO2, Arterial: 175 mmHg — ABNORMAL HIGH (ref 83–108)

## 2022-04-30 MED ORDER — PANTOPRAZOLE SODIUM 40 MG PO TBEC
40.0000 mg | DELAYED_RELEASE_TABLET | Freq: Every day | ORAL | Status: DC
  Administered 2022-04-30 – 2022-05-02 (×3): 40 mg via ORAL
  Filled 2022-04-30 (×3): qty 1

## 2022-04-30 MED ORDER — SUVOREXANT 15 MG PO TABS: 15.0000 mg | ORAL_TABLET | Freq: Every day | ORAL | Status: DC

## 2022-04-30 MED ORDER — METHYLPREDNISOLONE SODIUM SUCC 125 MG IJ SOLR
80.0000 mg | INTRAMUSCULAR | Status: DC
  Administered 2022-04-30 – 2022-05-02 (×3): 80 mg via INTRAVENOUS
  Filled 2022-04-30 (×3): qty 2

## 2022-04-30 MED ORDER — ALBUTEROL SULFATE (2.5 MG/3ML) 0.083% IN NEBU
2.5000 mg | INHALATION_SOLUTION | RESPIRATORY_TRACT | Status: DC | PRN
  Administered 2022-04-30 (×2): 2.5 mg via RESPIRATORY_TRACT
  Filled 2022-04-30 (×2): qty 3

## 2022-04-30 MED ORDER — ESTRADIOL 1 MG PO TABS
0.5000 mg | ORAL_TABLET | Freq: Every day | ORAL | Status: DC
  Administered 2022-05-01 – 2022-05-02 (×2): 0.5 mg via ORAL
  Filled 2022-04-30 (×2): qty 0.5

## 2022-04-30 MED ORDER — ALBUTEROL SULFATE (2.5 MG/3ML) 0.083% IN NEBU
10.0000 mg/h | INHALATION_SOLUTION | Freq: Once | RESPIRATORY_TRACT | Status: AC
  Administered 2022-04-30: 10 mg/h via RESPIRATORY_TRACT
  Filled 2022-04-30: qty 12

## 2022-04-30 MED ORDER — FAMOTIDINE 20 MG PO TABS: 20.0000 mg | ORAL_TABLET | Freq: Every day | ORAL | Status: DC | PRN

## 2022-04-30 MED ORDER — MONTELUKAST SODIUM 10 MG PO TABS
10.0000 mg | ORAL_TABLET | Freq: Every day | ORAL | Status: DC
  Administered 2022-04-30 – 2022-05-01 (×2): 10 mg via ORAL
  Filled 2022-04-30 (×4): qty 1

## 2022-04-30 MED ORDER — FLUTICASONE PROPIONATE 50 MCG/ACT NA SUSP: 1.0000 | Freq: Two times a day (BID) | NASAL | Status: DC | PRN

## 2022-04-30 MED ORDER — UMECLIDINIUM BROMIDE 62.5 MCG/ACT IN AEPB
1.0000 | INHALATION_SPRAY | Freq: Every day | RESPIRATORY_TRACT | Status: DC
  Administered 2022-05-01 – 2022-05-02 (×2): 1 via RESPIRATORY_TRACT
  Filled 2022-04-30: qty 7

## 2022-04-30 MED ORDER — ALBUTEROL SULFATE (2.5 MG/3ML) 0.083% IN NEBU
2.5000 mg | INHALATION_SOLUTION | RESPIRATORY_TRACT | Status: DC
  Administered 2022-04-30: 2.5 mg via RESPIRATORY_TRACT
  Filled 2022-04-30 (×2): qty 3

## 2022-04-30 MED ORDER — SODIUM CHLORIDE 0.9 % IV BOLUS
1000.0000 mL | Freq: Once | INTRAVENOUS | Status: AC
  Administered 2022-04-30: 1000 mL via INTRAVENOUS

## 2022-04-30 MED ORDER — ALBUTEROL SULFATE (2.5 MG/3ML) 0.083% IN NEBU: 2.5000 mg | INHALATION_SOLUTION | Freq: Four times a day (QID) | RESPIRATORY_TRACT | Status: DC

## 2022-04-30 MED ORDER — IRBESARTAN 75 MG PO TABS: 75.0000 mg | ORAL_TABLET | Freq: Every day | ORAL | Status: DC

## 2022-04-30 MED ORDER — HYDROXYZINE HCL 25 MG PO TABS
25.0000 mg | ORAL_TABLET | Freq: Two times a day (BID) | ORAL | Status: DC | PRN
  Administered 2022-04-30 – 2022-05-01 (×2): 25 mg via ORAL
  Filled 2022-04-30 (×2): qty 1

## 2022-04-30 MED ORDER — ALBUTEROL SULFATE (2.5 MG/3ML) 0.083% IN NEBU
2.5000 mg | INHALATION_SOLUTION | RESPIRATORY_TRACT | Status: DC
  Administered 2022-04-30 – 2022-05-02 (×8): 2.5 mg via RESPIRATORY_TRACT
  Filled 2022-04-30 (×8): qty 3

## 2022-04-30 MED ORDER — ENOXAPARIN SODIUM 40 MG/0.4ML IJ SOSY
40.0000 mg | PREFILLED_SYRINGE | INTRAMUSCULAR | Status: DC
  Administered 2022-04-30 – 2022-05-02 (×3): 40 mg via SUBCUTANEOUS
  Filled 2022-04-30 (×3): qty 0.4

## 2022-04-30 MED ORDER — MOMETASONE FURO-FORMOTEROL FUM 200-5 MCG/ACT IN AERO
2.0000 | INHALATION_SPRAY | Freq: Two times a day (BID) | RESPIRATORY_TRACT | Status: DC
  Administered 2022-04-30 – 2022-05-02 (×4): 2 via RESPIRATORY_TRACT
  Filled 2022-04-30: qty 8.8

## 2022-04-30 MED ORDER — ATORVASTATIN CALCIUM 40 MG PO TABS
40.0000 mg | ORAL_TABLET | Freq: Every day | ORAL | Status: DC
Start: 2022-04-30 — End: 2022-05-02
  Administered 2022-04-30 – 2022-05-02 (×3): 40 mg via ORAL
  Filled 2022-04-30 (×3): qty 1

## 2022-04-30 MED ORDER — CARBAMAZEPINE 200 MG PO TABS
400.0000 mg | ORAL_TABLET | Freq: Two times a day (BID) | ORAL | Status: DC
  Administered 2022-04-30 – 2022-05-02 (×5): 400 mg via ORAL
  Filled 2022-04-30: qty 4
  Filled 2022-04-30: qty 2
  Filled 2022-04-30: qty 4
  Filled 2022-04-30 (×2): qty 2
  Filled 2022-04-30: qty 4

## 2022-04-30 MED ORDER — GABAPENTIN 100 MG PO CAPS
100.0000 mg | ORAL_CAPSULE | Freq: Three times a day (TID) | ORAL | Status: DC
Start: 2022-04-30 — End: 2022-05-02
  Administered 2022-04-30 – 2022-05-02 (×6): 100 mg via ORAL
  Filled 2022-04-30 (×7): qty 1

## 2022-04-30 MED ORDER — LORATADINE 10 MG PO TABS
10.0000 mg | ORAL_TABLET | Freq: Every evening | ORAL | Status: DC
  Administered 2022-05-01: 10 mg via ORAL
  Filled 2022-04-30: qty 1

## 2022-04-30 NOTE — Assessment & Plan Note (Signed)
-  Continue Claritin (substituted for home Xyzal) and Singulair ?

## 2022-04-30 NOTE — H&P (Signed)
?History and Physical  ? ? ?Patient: [name] NKN:[id number] DOB: 1961-09-08 ?DOA: 04/30/2022 ?DOS: the patient was seen and examined on 04/30/2022 ?PCP: Biagio Borg, MD  ?Patient coming from: Home ? ?Chief Complaint:  ?Chief Complaint  ?Patient presents with  ? Respiratory Distress  ? ?HPI: [name] is a 61 y.o. female with medical history significant of HTN, COPD/asthma overlap syndrome.  Severe persistent asthma on dupilumab therapy with most recent injection on 5/10 it looks like.  Silent stroke.  Questionable seizure disorder vs pseudoseizures. ? ?Pt presents to ED with several day h/o gradually worsening SOB, wheezing.  This became severe tonight with respiratory distress prompting call to EMS. ? ?EMS found pt with decreased air movement.  Put pt on CPAP, duoneb x2, solu-medrol, and magnesium. ? ?Pt air movement and WOB improved some in ED.  She is now on rescue BIPAP. ? ?  ?Review of Systems: As mentioned in the history of present illness. All other systems reviewed and are negative. ?Past Medical History:  ?Diagnosis Date  ? Anemia, iron deficiency 12/22/2014  ? pt. denies  ? Anxiety   ? Arthritis   ? ASTHMA 05/12/2009  ? 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     ? Bipolar disorder ([place])   ? COPD (chronic obstructive pulmonary disease) ([place])   ? Depression   ? Fibromyalgia 05/14/2014  ? GERD (gastroesophageal reflux disease)   ? History of kidney stones   ? Hyperlipidemia 04/20/2017  ? HYPERTENSION 05/12/2009  ? Qualifier: Diagnosis of  By: Lenn Cal Deborra Medina), Wynona Canes    ? Kidney stones   ? Peripheral vascular disease ([place])   ? Pneumonia   ? Prediabetes 02/23/2014  ? pt. denies  ? Seizure ([place])   ? Stroke [facility]) 11/2020  ? Urticaria   ? ?Past Surgical History:  ?Procedure Laterality Date  ? ABDOMINAL HYSTERECTOMY    ? ANTERIOR  CERVICAL DECOMP/DISCECTOMY FUSION N/A 07/28/2020  ? Procedure: ANTERIOR CERVICAL DECOMPRESSION/DISCECTOMY FUSION. INTERBODY PROTHESIS, PLATE/SCREWS CERVICAL THREE- CERVICAL FOUR, CERVICAL FOUR- CERVICAL FIVE;  Surgeon: Newman Pies, MD;  Location: [place];  Service: Neurosurgery;  Laterality: N/A;  ? BACK SURGERY    ? COLONOSCOPY  12/20/2011  ? Procedure: COLONOSCOPY;  Surgeon: Landry Dyke, MD;  Location: [facility];  Service: Endoscopy;  Laterality: N/A;  ? COLONOSCOPY  03/05/2012  ? Procedure: COLONOSCOPY;  Surgeon: Landry Dyke, MD;  Location: [facility];  Service: Endoscopy;  Laterality: N/A;  ? DIAGNOSTIC LAPAROSCOPY    ? HEMORRHOID SURGERY    ? INCISE AND DRAIN ABCESS    ? KIDNEY STONE SURGERY    ? NECK SURGERY    ? x 2 Dr Orinda Kenner  ? SPINE SURGERY  2019  ? TOE SURGERY    ? TUBAL LIGATION    ? ?Social History:  reports that she quit smoking about 3 years ago. Her smoking use included cigarettes. She has a 9.00 pack-year smoking history. She has never used smokeless tobacco. She reports that she does not currently use alcohol. She reports current drug use. Drug: Marijuana. ? ?Allergies  ?Allergen Reactions  ? Sulfa Antibiotics Nausea And Vomiting  ? Venofer [Iron Sucrose] Hives  ? ? ?Family History  ?Problem Relation Age of Onset  ? Diabetes Mother   ? COPD Mother   ? Heart disease Mother   ? Asthma Mother   ?  Diabetes Father   ? Kidney disease Father   ? Anesthesia problems Father   ? Alcohol abuse Father   ? Colon cancer Father 75  ? Diabetes Sister   ? Hypertension Sister   ? Heart disease Sister   ? Diabetes Sister   ? Brain cancer Sister   ? Hypertension Sister   ? Asthma Sister   ? Diabetes Sister   ? COPD Sister   ? Hypertension Sister   ? Anemia Sister   ? Hypertension Sister   ? Diabetes Sister   ? Anemia Sister   ? Hypertension Sister   ? Diabetes Brother   ? Sleep apnea Brother   ? Asthma Brother   ? Alcohol abuse Brother   ? Heart disease Brother   ? Heart disease Brother   ?  Hypertension Brother   ? Hypertension Daughter   ? Anemia Daughter   ? Allergic rhinitis Neg Hx   ? Eczema Neg Hx   ? Urticaria Neg Hx   ? Esophageal cancer Neg Hx   ? Prostate cancer Neg Hx   ? Rectal cancer Neg Hx   ? ? ?Prior to Admission medications   ?Medication Sig Start Date End Date Taking? Authorizing Provider  ?acetaminophen (TYLENOL) 325 MG tablet Take 2 tablets (650 mg total) by mouth every 6 (six) hours as needed for mild pain (or Fever >/= 101). 02/07/22   Elgergawy, Silver Huguenin, MD  ?albuterol (PROVENTIL) (2.5 MG/3ML) 0.083% nebulizer solution Take 3 mLs (2.5 mg total) by nebulization every 6 (six) hours as needed for wheezing or shortness of breath. Reported on 06/04/2016 08/11/20   Lauraine Rinne, NP  ?albuterol (VENTOLIN HFA) 108 (90 Base) MCG/ACT inhaler Inhale 2 puffs into the lungs every 4 (four) hours as needed for wheezing or shortness of breath. 04/11/22   Biagio Borg, MD  ?atorvastatin (LIPITOR) 40 MG tablet TAKE 1 TABLET(40 MG) BY MOUTH DAILY ?Patient taking differently: Take 40 mg by mouth daily. 11/06/21   Biagio Borg, MD  ?Biotin 1000 MCG tablet Take 1,000 mcg by mouth daily.    [provider]  ?Budeson-Glycopyrrol-Formoterol (BREZTRI AEROSPHERE) 160-9-4.8 MCG/ACT AERO INHALE 2 PUFFS INTO THE LUNGS TWICE DAILY WITH SPACER ?Patient taking differently: Inhale 2 puffs into the lungs 2 (two) times daily. WITH SPACER 10/11/21   Biagio Borg, MD  ?carbamazepine (TEGRETOL) 200 MG tablet Take 2 tablets (400 mg total) by mouth 2 (two) times daily. 03/23/22   Plovsky, Berneta Sages, MD  ?cetirizine (ZYRTEC) 10 MG tablet Can take one tablet by mouth once daily if needed for runny nose. 04/28/21   Dara Hoyer, FNP  ?cyclobenzaprine (FLEXERIL) 5 MG tablet TAKE 1 TABLET(5 MG) BY MOUTH THREE TIMES DAILY AS NEEDED FOR MUSCLE SPASMS ?Patient taking differently: Take 5 mg by mouth 3 (three) times daily as needed for muscle spasms. 10/11/21   Biagio Borg, MD  ?diclofenac Sodium (VOLTAREN) 1 % GEL Apply 4 g  topically 4 (four) times daily. ?Patient taking differently: Apply 4 g topically 4 (four) times daily as needed (pain). 06/04/20   Deno Etienne, DO  ?estradiol (ESTRACE) 0.5 MG tablet Take 1 tablet (0.5 mg total) by mouth daily. 09/21/21   Princess Bruins, MD  ?famotidine (PEPCID) 20 MG tablet Take 20 mg by mouth daily as needed for heartburn or indigestion. OTC    [provider]  ?fluticasone (FLONASE) 50 MCG/ACT nasal spray Place 1 spray into both nostrils 2 (two) times daily as needed for allergies  or rhinitis. 09/06/21   Valentina Shaggy, MD  ?gabapentin (NEURONTIN) 100 MG capsule Take 1 capsule (100 mg total) by mouth 3 (three) times daily. 03/23/22   Plovsky, Berneta Sages, MD  ?hydrochlorothiazide (MICROZIDE) 12.5 MG capsule TAKE 1 CAPSULE(12.5 MG) BY MOUTH DAILY ?Patient taking differently: Take 12.5 mg by mouth daily. 11/06/21   Biagio Borg, MD  ?hydrOXYzine (VISTARIL) 25 MG capsule Take 1 when angry and if not calmer in 1 hour repeat 03/23/22   Norma Fredrickson, MD  ?ipratropium (ATROVENT HFA) 17 MCG/ACT inhaler Inhale 2 puffs into the lungs every 6 (six) hours as needed for wheezing. 01/09/22   Valentina Shaggy, MD  ?ipratropium-albuterol (DUONEB) 0.5-2.5 (3) MG/3ML SOLN Take 3 mLs by nebulization every 6 (six) hours as needed. ?Patient taking differently: Take 3 mLs by nebulization every 6 (six) hours as needed (wheezing). 08/21/20   Tasia Catchings, Amy V, PA-C  ?irbesartan (AVAPRO) 75 MG tablet TAKE 1 TABLET(75 MG) BY MOUTH DAILY ?Patient taking differently: Take 75 mg by mouth daily. 11/06/21   Biagio Borg, MD  ?levocetirizine Harlow Ohms) 5 MG tablet Take 1 tablet (5 mg total) by mouth every evening. 09/06/21   Valentina Shaggy, MD  ?lidocaine (LIDODERM) 5 % Place 1 patch onto the skin daily. Remove & Discard patch within 12 hours or as directed by MD 01/09/22   Lacretia Leigh, MD  ?montelukast (SINGULAIR) 10 MG tablet TAKE 1 TABLET(10 MG) BY MOUTH AT BEDTIME ?Patient taking differently: Take 10 mg by mouth  at bedtime. 12/06/21   Collene Gobble, MD  ?ondansetron (ZOFRAN ODT) 4 MG disintegrating tablet Take 1 tablet (4 mg total) by mouth every 8 (eight) hours as needed for nausea or vomiting. 05/19/21   Cena Benton

## 2022-04-30 NOTE — Evaluation (Signed)
Occupational Therapy Evaluation ?Patient Details ?Name: [name] ?MRN: [id number] ?DOB: 12-Feb-1961 ?Today's Date: 04/30/2022 ? ? ?History of Present Illness Pt is a 61 y/o female presenting on 5/15 with respiratory distress.  Admitted with acute respiratory failure with hypoxia and hypercapnia. PMH includes: HTN, COPD/asthma overlap syndrome, bipolar, PNA, seizure, CVA, arthritis, fibromyalgia, anxiety, hx ACDF.  ? ?Clinical Impression ?  ?PTA patient independent and driving. Admitted for above and presents with decreased activity tolerance and cardiopulmonary status.  Pt on 3L with SpO2 maintained >98% throughout session, wheezing noted but improved significantly when cued for PLB.  Limited session due to decreased tolerance, supervision to EOB.  ADLs in long sitting and EOB with supervision to min guard.  Recommend continued OT services acutely to optimize tolerance and return to PLOF.  Will follow, anticipate HHOT vs no OT follow up pending progression.   ?   ? ?Recommendations for follow up therapy are one component of a multi-disciplinary discharge planning process, led by the attending physician.  Recommendations may be updated based on patient status, additional functional criteria and insurance authorization.  ? ?Follow Up Recommendations ? Home health OT (pending progress)  ?  ?Assistance Recommended at Discharge Intermittent Supervision/Assistance  ?Patient can return home with the following A little help with bathing/dressing/bathroom ? ?  ?Functional Status Assessment ? Patient has had a recent decline in their functional status and demonstrates the ability to make significant improvements in function in a reasonable and predictable amount of time.  ?Equipment Recommendations ? None recommended by OT  ?  ?Recommendations for Other Services PT consult ? ? ?  ?Precautions / Restrictions Precautions ?Precautions: Other (comment) ?Precaution Comments: watch O2 ?Restrictions ?Weight Bearing  Restrictions: No  ? ?  ? ?Mobility Bed Mobility ?Overal bed mobility: Needs Assistance ?Bed Mobility: Supine to Sit, Sit to Supine ?  ?  ?Supine to sit: Supervision ?Sit to supine: Supervision ?  ?General bed mobility comments: no assist requierd, supervision for safety ?  ? ?Transfers ?  ?  ?  ?  ?  ?  ?  ?  ?  ?  ?  ? ?  ?Balance Overall balance assessment: Needs assistance ?Sitting-balance support: No upper extremity supported, Feet supported ?Sitting balance-Leahy Scale: Good ?  ?  ?  ?  ?  ?  ?  ?  ?  ?  ?  ?  ?  ?  ?  ?  ?   ? ?ADL either performed or assessed with clinical judgement  ? ?ADL Overall ADL's : Needs assistance/impaired ?  ?  ?Grooming: Set up;Sitting ?  ?  ?  ?  ?  ?Upper Body Dressing : Set up;Sitting ?  ?Lower Body Dressing: Min guard;Sitting/lateral leans ?  ?  ?Toilet Transfer Details (indicate cue type and reason): deferred d/t fatigue ?  ?  ?  ?  ?Functional mobility during ADLs: Supervision/safety ?General ADL Comments: limited to EOB due to decreased activity tolerance and fatigue  ? ? ? ?Vision   ?Vision Assessment?: No apparent visual deficits  ?   ?Perception   ?  ?Praxis   ?  ? ?Pertinent Vitals/Pain Pain Assessment ?Pain Assessment: Faces ?Faces Pain Scale: Hurts a little bit ?Pain Location: headache ?Pain Descriptors / Indicators: Headache ?Pain Intervention(s): Limited activity within patient's tolerance, Monitored during session, Repositioned  ? ? ? ?Hand Dominance Right ?  ?Extremity/Trunk Assessment Upper Extremity Assessment ?Upper Extremity Assessment: Generalized weakness ?  ?Lower Extremity Assessment ?Lower Extremity Assessment: Defer to  PT evaluation ?  ?  ?  ?Communication Communication ?Communication: No difficulties ?  ?Cognition Arousal/Alertness: Awake/alert ?Behavior During Therapy: [facility] for tasks assessed/performed ?Overall Cognitive Status: Within Functional Limits for tasks assessed ?  ?  ?  ?  ?  ?  ?  ?  ?  ?  ?  ?  ?  ?  ?  ?  ?  ?  ?  ?General Comments  cueing  for PLB throughout session, wheezing and improved with PLB.  Decreased activity tolerance. ? ?  ?Exercises   ?  ?Shoulder Instructions    ? ? ?Home Living Family/patient expects to be discharged to:: Private residence ?Living Arrangements: Alone ?Available Help at Discharge: Family ?Type of Home: Apartment ?Home Access: Level entry ?  ?  ?Home Layout: One level ?  ?  ?Bathroom Shower/Tub: Sponge bathes at baseline ?  ?Bathroom Toilet: Standard ?  ?  ?Home Equipment: Conservation officer, nature (2 wheels);Cane - single point ?  ?  ?  ? ?  ?Prior Functioning/Environment Prior Level of Function : Independent/Modified Independent;Driving;History of Falls (last six months) ?  ?  ?  ?  ?  ?  ?Mobility Comments: independent, reports 2 falls in the last 6 months ?ADLs Comments: independent ADLs, IADLs limited (daughter assists), driving ?  ? ?  ?  ?OT Problem List: Decreased strength;Decreased activity tolerance;Cardiopulmonary status limiting activity;Decreased knowledge of precautions;Decreased knowledge of use of DME or AE;Decreased safety awareness ?  ?   ?OT Treatment/Interventions: Self-care/ADL training;Therapeutic exercise;DME and/or AE instruction;Therapeutic activities;Patient/family education;Energy conservation  ?  ?OT Goals(Current goals can be found in the care plan section) Acute Rehab OT Goals ?Patient Stated Goal: breathe better ?OT Goal Formulation: With patient ?Time For Goal Achievement: 05/14/22 ?Potential to Achieve Goals: Good  ?OT Frequency: Min 2X/week ?  ? ?Co-evaluation   ?  ?  ?  ?  ? ?  ?AM-PAC OT "6 Clicks" Daily Activity     ?Outcome Measure Help from another person eating meals?: A Little ?Help from another person taking care of personal grooming?: A Little ?Help from another person toileting, which includes using toliet, bedpan, or urinal?: A Little ?Help from another person bathing (including washing, rinsing, drying)?: A Little ?Help from another person to put on and taking off regular upper body  clothing?: A Little ?Help from another person to put on and taking off regular lower body clothing?: A Little ?6 Click Score: 18 ?  ?End of Session Equipment Utilized During Treatment: Oxygen (3L) ?Nurse Communication: Mobility status ? ?Activity Tolerance: Patient tolerated treatment well ?Patient left: in bed;with call bell/phone within reach ? ?OT Visit Diagnosis: Other (comment);Other abnormalities of gait and mobility (R26.89) (decreased activity tolerance)  ?              ?Time: 2355-7322 ?OT Time Calculation (min): 13 min ?Charges:  OT General Charges ?$OT Visit: 1 Visit ?OT Evaluation ?$OT Eval Moderate Complexity: 1 Mod ? ?Jolaine Artist, OT ?Acute Rehabilitation Services ?Pager [phone] ?Office [phone] ? ? ?Delight Stare ?04/30/2022, 1:29 PM ?

## 2022-04-30 NOTE — Assessment & Plan Note (Signed)
See prolem, Acute respiratory failure with hypoxia and hypercapnia ?

## 2022-04-30 NOTE — Progress Notes (Signed)
Pt VS stable. Pt on 2L [place] receiving Neb treatments Q4 while awake. No resp. Distress noted at this time. RT will monitor.  ?

## 2022-04-30 NOTE — Evaluation (Signed)
Physical Therapy Evaluation ?Patient Details ?Name: [name] ?MRN: [id number] ?DOB: 1961/07/10 ?Today's Date: 04/30/2022 ? ?History of Present Illness ? Pt is a 61 y/o female presenting on 5/15 with respiratory distress.  Admitted with acute respiratory failure with hypoxia and hypercapnia. PMH includes: HTN, COPD/asthma overlap syndrome, bipolar, PNA, seizure, CVA, arthritis, fibromyalgia, anxiety, hx ACDF.  ?Clinical Impression ? Pt was seen for mobility on side of bed and to stand, then to get through a hallway walk with care regarding her breathing on 3L O2.  Pt is on O2 at home, and did not have a drop in sats with mobility upon return to the room based on telemetry.  However, pt did reports some minor dizzy feelings and  was only hypotensive with sitting value of 114/46 and then was better supported to stand and walk.  HR remained in the high 90's and up to 110.  Follow acutely for balance and strengthening to BLE's, with O2 used and to progress her mobility as tolerated.  Pt may elect to go directly home, in which case HHPT will be needed to pursue independent gait and transfers with management of O2 line observed. ?   ? ?Recommendations for follow up therapy are one component of a multi-disciplinary discharge planning process, led by the attending physician.  Recommendations may be updated based on patient status, additional functional criteria and insurance authorization. ? ?Follow Up Recommendations Skilled nursing-short term rehab (<3 hours/day) ? ?  ?Assistance Recommended at Discharge Intermittent Supervision/Assistance  ?Patient can return home with the following ? A little help with walking and/or transfers;A little help with bathing/dressing/bathroom;Assistance with cooking/housework;Assist for transportation;Help with stairs or ramp for entrance ? ?  ?Equipment Recommendations None recommended by PT  ?Recommendations for Other Services ?    ?  ?Functional Status Assessment Patient has  had a recent decline in their functional status and demonstrates the ability to make significant improvements in function in a reasonable and predictable amount of time.  ? ?  ?Precautions / Restrictions Precautions ?Precautions: Other (comment) ?Precaution Comments: watch O2 ?Restrictions ?Weight Bearing Restrictions: No  ? ?  ? ?Mobility ? Bed Mobility ?Overal bed mobility: Needs Assistance ?Bed Mobility: Supine to Sit, Sit to Supine ?  ?  ?Supine to sit: Min guard ?Sit to supine: Min guard ?  ?  ?  ? ?Transfers ?Overall transfer level: Needs assistance ?Equipment used: 1 person hand held assist ?Transfers: Sit to/from Stand ?Sit to Stand: Min guard ?  ?  ?  ?  ?  ?General transfer comment: min guard for safety ?  ? ?Ambulation/Gait ?Ambulation/Gait assistance: Min guard ?Gait Distance (Feet): 120 Feet ?Assistive device: 1 person hand held assist ?Gait Pattern/deviations: Step-through pattern, Decreased stride length, Wide base of support ?Gait velocity: reduced ?Gait velocity interpretation: <1.31 ft/sec, indicative of household ambulator ?Pre-gait activities: standing balance ck ?General Gait Details: Pt was seen for progressing with gait and noted her SOB was significant to walk on the hallway.  Pt is requring one short standing rest ? ?Stairs ?  ?  ?  ?  ?  ? ?Wheelchair Mobility ?  ? ?Modified Rankin (Stroke Patients Only) ?  ? ?  ? ?Balance Overall balance assessment: History of Falls, Needs assistance ?Sitting-balance support: Feet supported ?Sitting balance-Leahy Scale: Good ?  ?  ?Standing balance support: During functional activity, No upper extremity supported, Single extremity supported ?Standing balance-Leahy Scale: Fair ?  ?  ?  ?  ?  ?  ?  ?  ?  ?  ?  ?  ?   ? ? ? ?  Pertinent Vitals/Pain Pain Assessment ?Pain Assessment: Faces ?Faces Pain Scale: Hurts little more ?Pain Location: headache  ? ? ?Home Living Family/patient expects to be discharged to:: Private residence ?Living Arrangements:  Alone ?Available Help at Discharge: Family ?Type of Home: Apartment ?Home Access: Level entry ?  ?  ?  ?Home Layout: One level ?Home Equipment: Conservation officer, nature (2 wheels);Cane - single point ?   ?  ?Prior Function Prior Level of Function : Independent/Modified Independent;Driving;History of Falls (last six months) ?  ?  ?  ?  ?  ?  ?Mobility Comments: no AD needed but has fallen a couple times ?ADLs Comments: independent ADLs, IADLs limited (daughter assists), driving ?  ? ? ?Hand Dominance  ? Dominant Hand: Right ? ?  ?Extremity/Trunk Assessment  ? Upper Extremity Assessment ?Upper Extremity Assessment: Defer to OT evaluation ?  ? ?Lower Extremity Assessment ?Lower Extremity Assessment: Overall WFL for tasks assessed ?  ? ?   ?Communication  ? Communication: No difficulties  ?Cognition Arousal/Alertness: Awake/alert ?Behavior During Therapy: [facility] for tasks assessed/performed ?Overall Cognitive Status: Within Functional Limits for tasks assessed ?  ?  ?  ?  ?  ?  ?  ?  ?  ?  ?  ?  ?  ?  ?  ?  ?General Comments: following directions well to manage her wheezing and SOB ?  ?  ? ?  ?General Comments General comments (skin integrity, edema, etc.): reminders for breathing techniques, reduced conversation with gait and focused on pursed lip breathing ? ?  ?Exercises    ? ?Assessment/Plan  ?  ?PT Assessment Patient needs continued PT services  ?PT Problem List Cardiopulmonary status limiting activity;Decreased mobility;Decreased activity tolerance;Decreased balance;Decreased knowledge of use of DME ? ?   ?  ?PT Treatment Interventions DME instruction;Gait training;Stair training;Functional mobility training;Therapeutic activities;Therapeutic exercise;Balance training;Neuromuscular re-education;Patient/family education   ? ?PT Goals (Current goals can be found in the Care Plan section)  ?Acute Rehab PT Goals ?Patient Stated Goal: to get home soon ?PT Goal Formulation: With patient ?Time For Goal Achievement:  05/14/22 ?Potential to Achieve Goals: Good ? ?  ?Frequency Min 3X/week ?  ? ? ?Co-evaluation   ?  ?  ?  ?  ? ? ?  ?AM-PAC PT "6 Clicks" Mobility  ?Outcome Measure Help needed turning from your back to your side while in a flat bed without using bedrails?: A Little ?Help needed moving from lying on your back to sitting on the side of a flat bed without using bedrails?: A Little ?Help needed moving to and from a bed to a chair (including a wheelchair)?: A Little ?Help needed standing up from a chair using your arms (e.g., wheelchair or bedside chair)?: A Little ?Help needed to walk in hospital room?: A Little ?Help needed climbing 3-5 steps with a railing? : A Lot ?6 Click Score: 17 ? ?  ?End of Session Equipment Utilized During Treatment: Gait belt ?Activity Tolerance: Treatment limited secondary to medical complications (Comment) ?Patient left: in bed;with call bell/phone within reach ?Nurse Communication: Mobility status ?PT Visit Diagnosis: Unsteadiness on feet (R26.81);Difficulty in walking, not elsewhere classified (R26.2) ?  ? ?Time: 1130-1159 ?PT Time Calculation (min) (ACUTE ONLY): 29 min ? ? ?Charges:   PT Evaluation ?$PT Eval Moderate Complexity: 1 Mod ?PT Treatments ?$Gait Training: 8-22 mins ?  ?   ? ?Ramond Dial ?04/30/2022, 4:11 PM ? ?Mee Hives, PT PhD ?Acute Rehab Dept. Number: [facility] [phone] and [place] [phone] ? ?

## 2022-04-30 NOTE — Progress Notes (Signed)
Patient sats were 86%. Patient pulled off BIPAP mask and requested for a breathing treatment. RN placed patient on 4LNC oxygen saturation of 100%. RT notified of patient's request of prn nebulizer. RT to round on patient. ?

## 2022-04-30 NOTE — ED Triage Notes (Addendum)
Pt bib [facility] from home for respiratory distress. Pt has been having shortness of breath today and that suddenly became worse. EMS placed pt on cpap d/t work of breathing. Hx of COPD. Denies chest pain with ems. 125 solu medrol, 2g mag, and 1 duoneb given PTA. ? ?Spo2 95%, HR 90's-100's SR  ?

## 2022-04-30 NOTE — Assessment & Plan Note (Addendum)
Likely asthma related. Recent exposure to flowers which may have triggered. Patient treated with Solu-medrol, magnesium, albuterol initially. Started on BiPAP for work of breathing. Patient improving but not near baseline. PT/OT recommending SNF at this time. RVP negative. ?-Continue Solu-medrol ?-Continue scheduled albuterol 2.5 mg neb q4 hours and continue PRN ?-Wean supplemental oxygen to room air ?-Continue Dulera and incruse Ellipta ?-TOC for possible Trilogy ?

## 2022-04-30 NOTE — Progress Notes (Signed)
Pt was transported to 3E via BiPAP with no complications. Pt is currently stable and 3E RT is aware. ?

## 2022-04-30 NOTE — Assessment & Plan Note (Addendum)
Patient is on [place] as an outpatient. See problem, Acute respiratory failure with hypoxia and hypercapnia ?

## 2022-04-30 NOTE — Assessment & Plan Note (Addendum)
Blood pressure well controlled. ?-Hold home irbesartan and hydrochlorothiazide ?

## 2022-04-30 NOTE — Progress Notes (Signed)
? ?PROGRESS NOTE ? ? ? ?[name]  MLY:[id number] DOB: [birth year] DOA: 04/30/2022 ?PCP: Biagio Borg, MD ? ? ?Brief Narrative: ?No notes on file ? ? ?Assessment and Plan: ?* Acute respiratory failure with hypoxia and hypercapnia (HCC) ?Likely asthma related. Recent exposure to flowers which may have triggered. Patient treated with Solu-medrol, magnesium, albuterol initially. Started on BiPAP for work of breathing.  ?-Transition off BiPAP today ?-Continue Solu-medrol ?-Start scheduled albuterol 2.5 mg neb q4 hours and continue PRN ?-Wean supplemental oxygen to room air ?-Continue Dulera and incruse Ellipta ?-RVP ? ?Seizure disorder ([place]) ?-Continue home Tegretol and gabapentin ? ?COPD exacerbation ([place]) ?See prolem, Acute respiratory failure with hypoxia and hypercapnia ? ?Essential (primary) hypertension ?Blood pressure well controlled. ?-Hold home irbesartan and hydrochlorothiazide ? ?Asthma-COPD overlap syndrome ([place]) ?Patient is on [place] as an outpatient. See problem, Acute respiratory failure with hypoxia and hypercapnia ? ?Allergic rhinitis ?-Continue Claritin (substituted for home Xyzal) and Singulair ? ? ? ?DVT prophylaxis: Lovenox ?Code Status:   Code Status: Full Code ?Family Communication: None at bedside ?Disposition Plan: Discharge home likely in 1-2 days ? ? ?Consultants:  ?None ? ?Procedures:  ?BiPAP ? ?Antimicrobials: ?None  ? ? ?Subjective: ?Patient reports improved dyspnea at rest but still with significant dyspnea on exertion. ? ?Objective: ?BP 91/60   Pulse 95   Temp (!) 97.4 ?F (36.3 ?C) (Tympanic)   Resp 18   Ht '5\' 3"'$  (1.6 m)   Wt 69.4 kg   SpO2 100%   BMI 27.10 kg/m?  ? ?Examination: ? ?General exam: Appears calm and comfortable on BiPAP ?Respiratory system: Significant/diffuse wheezing with poor air movement. Respiratory effort normal. ?Cardiovascular system: S1 & S2 heard, RRR. No murmurs, rubs, gallops or clicks. ?Gastrointestinal system: Abdomen is nondistended,  soft and nontender. Normal bowel sounds heard. ?Central nervous system: Alert and oriented. No focal neurological deficits. ?Musculoskeletal: No edema. No calf tenderness ?Skin: No cyanosis. No rashes ?Psychiatry: Judgement and insight appear normal. Mood & affect appropriate.  ? ? ?Data Reviewed: I have personally reviewed following labs and imaging studies ? ?CBC ?Lab Results  ?Component Value Date  ? WBC 16.2 (H) 04/30/2022  ? RBC 3.58 (L) 04/30/2022  ? HGB 10.5 (L) 04/30/2022  ? HCT 31.0 (L) 04/30/2022  ? MCV 89.1 04/30/2022  ? MCH 27.4 04/30/2022  ? PLT 378 04/30/2022  ? MCHC 30.7 04/30/2022  ? RDW 14.7 04/30/2022  ? LYMPHSABS 5.2 (H) 04/30/2022  ? MONOABS 1.5 (H) 04/30/2022  ? EOSABS 0.1 04/30/2022  ? BASOSABS 0.1 04/30/2022  ? ? ? ?Last metabolic panel ?Lab Results  ?Component Value Date  ? NA 136 04/30/2022  ? K 3.6 04/30/2022  ? CL 105 04/30/2022  ? CO2 25 04/30/2022  ? BUN 10 04/30/2022  ? CREATININE 0.78 04/30/2022  ? GLUCOSE 139 (H) 04/30/2022  ? GFRNONAA >60 04/30/2022  ? GFRAA >60 07/19/2020  ? CALCIUM 8.7 (L) 04/30/2022  ? PHOS 3.4 06/11/2020  ? PROT 6.5 04/30/2022  ? ALBUMIN 3.8 04/30/2022  ? LABGLOB 2.2 07/31/2019  ? AGRATIO 2.0 07/31/2019  ? BILITOT 0.5 04/30/2022  ? ALKPHOS 71 04/30/2022  ? AST 21 04/30/2022  ? ALT 21 04/30/2022  ? ANIONGAP 8 04/30/2022  ? ? ?GFR: ?Estimated Creatinine Clearance: 70.8 mL/min (by C-G formula based on SCr of 0.78 mg/dL). ? ?No results found for this or any previous visit (from the past 240 hour(s)).  ? ? ?Radiology Studies: ?DG Chest Port 1 View ? ?Result Date: 04/30/2022 ?CLINICAL DATA:  Respiratory distress EXAM: PORTABLE CHEST 1 VIEW COMPARISON:  CT chest dated 03/08/2022 FINDINGS: Lungs are clear.  No pleural effusion or pneumothorax. The heart is normal in size. Cervical spine fixation hardware, incompletely visualized. IMPRESSION: No evidence of acute cardiopulmonary disease. Electronically Signed   By: Julian Hy M.D.   On: 04/30/2022 02:16   ? ? ? LOS:  0 days  ? ? ?Cordelia Poche, MD ?[facility] ?04/30/2022, 11:26 AM ? ? ?If 7PM-7AM, please contact night-coverage [email or web address] ? ?

## 2022-04-30 NOTE — ED Provider Notes (Signed)
?[facility] ?Provider Note ? ?CSN: [id number] ?Arrival date & time: 04/30/22 0148 ? ?Chief Complaint(s) ?Respiratory Distress ? ?HPI ?[name] is a 61 y.o. female with a past medical history listed below including asthma/COPD who presents to the emergency department for several days of gradually worsening shortness of breath that became severe tonight prompting call to EMS.  When they arrived patient was in respiratory distress with decreased air movement throughout.  She was placed on CPAP, given DuoNebs x2, Solu-Medrol, magnesium.  Patient's air movement and work of breathing has improved. ? ?HPI ? ?Past Medical History ?Past Medical History:  ?Diagnosis Date  ? Anemia, iron deficiency 12/22/2014  ? pt. denies  ? Anxiety   ? Arthritis   ? ASTHMA 05/12/2009  ? 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     ? Bipolar disorder ([place])   ? COPD (chronic obstructive pulmonary disease) ([place])   ? Depression   ? Fibromyalgia 05/14/2014  ? GERD (gastroesophageal reflux disease)   ? History of kidney stones   ? Hyperlipidemia 04/20/2017  ? HYPERTENSION 05/12/2009  ? Qualifier: Diagnosis of  By: Lenn Cal Deborra Medina), Wynona Canes    ? Kidney stones   ? Peripheral vascular disease ([place])   ? Pneumonia   ? Prediabetes 02/23/2014  ? pt. denies  ? Seizure ([place])   ? Stroke [facility]) 11/2020  ? Urticaria   ? ?Patient Active Problem List  ? Diagnosis Date Noted  ? Bilateral hearing loss 04/14/2022  ? Moderate persistent asthma without complication 97/94/8016  ? Asthma exacerbation 10/11/2021  ? Low back pain 10/11/2021  ? COPD exacerbation ([place]) 07/17/2021  ? Abnormal CXR 07/17/2021  ? Aortic atherosclerosis ([place]) 07/05/2021  ? URI (upper respiratory infection) 07/05/2021  ? Arthrodesis status 06/20/2021  ? Drug allergy 05/14/2021  ? Acute cough  05/14/2021  ? Bilateral leg weakness 04/17/2021  ? Status post cervical spinal fusion 12/27/2020  ? History of stroke 12/11/2020  ? Medication management 10/21/2020  ? Body mass index (BMI) 26.0-26.9, adult 08/23/2020  ? Healthcare maintenance 08/11/2020  ? Cervical spondylosis with myelopathy and radiculopathy 07/28/2020  ? Preop exam for internal medicine 06/19/2020  ? Hypotension due to medication 06/11/2020  ? Status asthmaticus 06/09/2020  ? Essential (primary) hypertension 05/17/2020  ? Other spondylosis with myelopathy, cervical region 02/16/2020  ? Cervical spondylosis 02/02/2020  ? Radiculopathy, cervical region 02/02/2020  ? Spondylolysis, cervical region 02/02/2020  ? Neck pain 02/02/2020  ? Leg cramping 08/12/2019  ? Laryngopharyngeal reflux (LPR) 05/26/2019  ? Sinus pressure 05/26/2019  ? Hypophosphatemia 01/07/2019  ? Vertigo 09/23/2018  ? Dysfunction of left eustachian tube 09/23/2018  ? Gait disorder 04/21/2018  ? Leukocytosis 04/15/2018  ? Nausea & vomiting 04/15/2018  ? Seizure ([place])   ? Prolapsed cervical intervertebral disc 03/11/2018  ? Cervical radiculopathy 02/18/2018  ? 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 Syncope 09/04/2017  ? Constipation 09/04/2017  ? Nonintractable headache 09/04/2017  ? Leg swelling 07/26/2017  ? 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 Hyperlipidemia 04/20/2017  ? Pedal edema 04/19/2017  ? Bipolar I disorder ([place]) 09/19/2016  ? Facial pain 07/12/2016  ? Rash 06/14/2016  ? Migraine 01/25/2016  ? Idiopathic urticaria/pruritus 01/23/2016  ? Allergic rhinitis 01/23/2016  ? Oral candidiasis 01/23/2016  ? Greater trochanteric bursitis of both hips 09/08/2015  ? Chest pain 06/22/2015  ? Itching 06/22/2015  ?  Bilateral knee pain 06/22/2015  ? Bilateral hip pain 06/22/2015  ? Anemia, iron deficiency 12/22/2014  ? Fatigue 12/21/2014  ? Intertrigo 08/07/2014  ? Dyspareunia 07/12/2014  ? Menopausal syndrome (hot flushes) 07/12/2014  ? History of tobacco abuse 07/12/2014  ?  Dizziness 06/22/2014  ? Weakness 06/22/2014  ? Fibromyalgia 05/14/2014  ? Encounter for well adult exam with abnormal findings 04/22/2014  ? Recurrent boils 04/22/2014  ? Recurrent falls 04/22/2014  ? Peripheral vascular disease ([place])   ? Depression   ? Anxiety   ? Gastroesophageal reflux disease   ? Prediabetes 02/23/2014  ? Screening mammogram for high-risk patient 02/23/2014  ? Back pain 07/22/2013  ? COPD/asthma 08/24/2009  ? Headache(784.0) 08/24/2009  ? Asthma-COPD overlap syndrome ([place]) 05/12/2009  ? ?Home Medication(s) ?Prior to Admission medications   ?Medication Sig Start Date End Date Taking? Authorizing Provider  ?acetaminophen (TYLENOL) 325 MG tablet Take 2 tablets (650 mg total) by mouth every 6 (six) hours as needed for mild pain (or Fever >/= 101). 02/07/22   Elgergawy, Silver Huguenin, MD  ?albuterol (PROVENTIL) (2.5 MG/3ML) 0.083% nebulizer solution Take 3 mLs (2.5 mg total) by nebulization every 6 (six) hours as needed for wheezing or shortness of breath. Reported on 06/04/2016 08/11/20   Lauraine Rinne, NP  ?albuterol (VENTOLIN HFA) 108 (90 Base) MCG/ACT inhaler Inhale 2 puffs into the lungs every 4 (four) hours as needed for wheezing or shortness of breath. 04/11/22   Biagio Borg, MD  ?atorvastatin (LIPITOR) 40 MG tablet TAKE 1 TABLET(40 MG) BY MOUTH DAILY ?Patient taking differently: Take 40 mg by mouth daily. 11/06/21   Biagio Borg, MD  ?Biotin 1000 MCG tablet Take 1,000 mcg by mouth daily.    [provider]  ?Budeson-Glycopyrrol-Formoterol (BREZTRI AEROSPHERE) 160-9-4.8 MCG/ACT AERO INHALE 2 PUFFS INTO THE LUNGS TWICE DAILY WITH SPACER ?Patient taking differently: Inhale 2 puffs into the lungs 2 (two) times daily. WITH SPACER 10/11/21   Biagio Borg, MD  ?carbamazepine (TEGRETOL) 200 MG tablet Take 2 tablets (400 mg total) by mouth 2 (two) times daily. 03/23/22   Plovsky, Berneta Sages, MD  ?cetirizine (ZYRTEC) 10 MG tablet Can take one tablet by mouth once daily if needed for runny nose. 04/28/21    Dara Hoyer, FNP  ?cyclobenzaprine (FLEXERIL) 5 MG tablet TAKE 1 TABLET(5 MG) BY MOUTH THREE TIMES DAILY AS NEEDED FOR MUSCLE SPASMS ?Patient taking differently: Take 5 mg by mouth 3 (three) times daily as needed for muscle spasms. 10/11/21   Biagio Borg, MD  ?diclofenac Sodium (VOLTAREN) 1 % GEL Apply 4 g topically 4 (four) times daily. ?Patient taking differently: Apply 4 g topically 4 (four) times daily as needed (pain). 06/04/20   Deno Etienne, DO  ?estradiol (ESTRACE) 0.5 MG tablet Take 1 tablet (0.5 mg total) by mouth daily. 09/21/21   Princess Bruins, MD  ?famotidine (PEPCID) 20 MG tablet Take 20 mg by mouth daily as needed for heartburn or indigestion. OTC    [provider]  ?fluticasone (FLONASE) 50 MCG/ACT nasal spray Place 1 spray into both nostrils 2 (two) times daily as needed for allergies or rhinitis. 09/06/21   Valentina Shaggy, MD  ?gabapentin (NEURONTIN) 100 MG capsule Take 1 capsule (100 mg total) by mouth 3 (three) times daily. 03/23/22   Plovsky, Berneta Sages, MD  ?hydrochlorothiazide (MICROZIDE) 12.5 MG capsule TAKE 1 CAPSULE(12.5 MG) BY MOUTH DAILY ?Patient taking differently: Take 12.5 mg by mouth daily. 11/06/21   Biagio Borg, MD  ?hydrOXYzine (  VISTARIL) 25 MG capsule Take 1 when angry and if not calmer in 1 hour repeat 03/23/22   Norma Fredrickson, MD  ?ipratropium (ATROVENT HFA) 17 MCG/ACT inhaler Inhale 2 puffs into the lungs every 6 (six) hours as needed for wheezing. 01/09/22   Valentina Shaggy, MD  ?ipratropium-albuterol (DUONEB) 0.5-2.5 (3) MG/3ML SOLN Take 3 mLs by nebulization every 6 (six) hours as needed. ?Patient taking differently: Take 3 mLs by nebulization every 6 (six) hours as needed (wheezing). 08/21/20   Tasia Catchings, Amy V, PA-C  ?irbesartan (AVAPRO) 75 MG tablet TAKE 1 TABLET(75 MG) BY MOUTH DAILY ?Patient taking differently: Take 75 mg by mouth daily. 11/06/21   Biagio Borg, MD  ?levocetirizine Harlow Ohms) 5 MG tablet Take 1 tablet (5 mg total) by mouth every evening.  09/06/21   Valentina Shaggy, MD  ?lidocaine (LIDODERM) 5 % Place 1 patch onto the skin daily. Remove & Discard patch within 12 hours or as directed by MD 01/09/22   Lacretia Leigh, MD  ?montelukast Laurine Blazer)

## 2022-04-30 NOTE — Progress Notes (Signed)
Pt more agitated and increase use of accessory muscles. RT placed Pt back on BiPAP was given ALB through BiPAP. Pt more comfortable at this time. RT will continue to monitor, RN aware. ?

## 2022-04-30 NOTE — Assessment & Plan Note (Addendum)
-  Continue home Tegretol and gabapentin ?

## 2022-04-30 NOTE — Progress Notes (Signed)
Pt is currently off BiPAP and is on 3L [place]. Pt states she can breathe fine and is currently stable. BiPAP is on standby in room if needed. RN aware, RT will continue to monitor for any changes. ?

## 2022-05-01 ENCOUNTER — Other Ambulatory Visit (HOSPITAL_COMMUNITY): Payer: Medicare HMO

## 2022-05-01 ENCOUNTER — Inpatient Hospital Stay (HOSPITAL_COMMUNITY): Payer: Medicare HMO

## 2022-05-01 ENCOUNTER — Telehealth: Payer: Self-pay

## 2022-05-01 DIAGNOSIS — J449 Chronic obstructive pulmonary disease, unspecified: Secondary | ICD-10-CM

## 2022-05-01 DIAGNOSIS — J9602 Acute respiratory failure with hypercapnia: Secondary | ICD-10-CM | POA: Diagnosis not present

## 2022-05-01 DIAGNOSIS — I10 Essential (primary) hypertension: Secondary | ICD-10-CM | POA: Diagnosis not present

## 2022-05-01 DIAGNOSIS — J9601 Acute respiratory failure with hypoxia: Secondary | ICD-10-CM | POA: Diagnosis not present

## 2022-05-01 LAB — CBC
HCT: 30 % — ABNORMAL LOW (ref 36.0–46.0)
Hemoglobin: 9.3 g/dL — ABNORMAL LOW (ref 12.0–15.0)
MCH: 27.3 pg (ref 26.0–34.0)
MCHC: 31 g/dL (ref 30.0–36.0)
MCV: 88 fL (ref 80.0–100.0)
Platelets: 302 10*3/uL (ref 150–400)
RBC: 3.41 MIL/uL — ABNORMAL LOW (ref 3.87–5.11)
RDW: 14.9 % (ref 11.5–15.5)
WBC: 18.8 10*3/uL — ABNORMAL HIGH (ref 4.0–10.5)
nRBC: 0 % (ref 0.0–0.2)

## 2022-05-01 MED ORDER — ENSURE ENLIVE PO LIQD
237.0000 mL | Freq: Two times a day (BID) | ORAL | Status: DC
  Administered 2022-05-01 – 2022-05-02 (×2): 237 mL via ORAL

## 2022-05-01 NOTE — Progress Notes (Addendum)
? ?PROGRESS NOTE ? ? ? ?[name]  PJK:[id number] DOB: 06-04-1961 DOA: 04/30/2022 ?PCP: Biagio Borg, MD ? ? ?Brief Narrative: ?[name] is a 61 y.o. female who presented secondary to dyspnea with evidence of asthma exacerbation and hypercapnia requiring BiPAP. ? ? ?Assessment and Plan: ?* Acute respiratory failure with hypoxia and hypercapnia (HCC) ?Likely asthma related. Recent exposure to flowers which may have triggered. Patient treated with Solu-medrol, magnesium, albuterol initially. Started on BiPAP for work of breathing. Patient improving but not near baseline. PT/OT recommending SNF at this time. RVP negative. ?-Continue Solu-medrol ?-Continue scheduled albuterol 2.5 mg neb q4 hours and continue PRN ?-Wean supplemental oxygen to room air ?-Continue Dulera and incruse Ellipta ?-TOC for possible Trilogy ? ?Seizure disorder ([place]) ?-Continue home Tegretol and gabapentin ? ?COPD exacerbation ([place]) ?See prolem, Acute respiratory failure with hypoxia and hypercapnia ? ?Essential (primary) hypertension ?Blood pressure well controlled. ?-Hold home irbesartan and hydrochlorothiazide ? ?Asthma-COPD overlap syndrome ([place]) ?Patient is on [place] as an outpatient. See problem, Acute respiratory failure with hypoxia and hypercapnia ? ?Allergic rhinitis ?-Continue Claritin (substituted for home Xyzal) and Singulair ? ? ? ?DVT prophylaxis: Lovenox ?Code Status:   Code Status: Full Code ?Family Communication: None at bedside ?Disposition Plan: Discharge home likely in 1-2 days ? ? ?Consultants:  ?None ? ?Procedures:  ?BiPAP ? ?Antimicrobials: ?None  ? ? ?Subjective: ?Patient reports improved dyspnea at rest but still with significant dyspnea on exertion. ? ?Objective: ?BP 139/83 (BP Location: Left Arm)   Pulse 68   Temp 97.9 ?F (36.6 ?C) (Oral)   Resp 16   Ht '5\' 3"'$  (1.6 m)   Wt 69.2 kg   SpO2 97%   BMI 27.01 kg/m?  ? ?Examination: ? ?General exam: Appears calm and  comfortable ?Respiratory system: Clear to auscultation on left but diminished on right. Respiratory effort normal. ?Cardiovascular system: S1 & S2 heard, RRR. No murmurs. ?Gastrointestinal system: Abdomen is nondistended, soft and nontender. Normal bowel sounds heard. ?Central nervous system: Alert and oriented. No focal neurological deficits. ?Musculoskeletal: No edema. No calf tenderness ?Skin: No cyanosis. No rashes ?Psychiatry: Judgement and insight appear normal. Mood & affect appropriate.  ? ? ?Data Reviewed: I have personally reviewed following labs and imaging studies ? ?CBC ?Lab Results  ?Component Value Date  ? WBC 18.8 (H) 05/01/2022  ? RBC 3.41 (L) 05/01/2022  ? HGB 9.3 (L) 05/01/2022  ? HCT 30.0 (L) 05/01/2022  ? MCV 88.0 05/01/2022  ? MCH 27.3 05/01/2022  ? PLT 302 05/01/2022  ? MCHC 31.0 05/01/2022  ? RDW 14.9 05/01/2022  ? LYMPHSABS 5.2 (H) 04/30/2022  ? MONOABS 1.5 (H) 04/30/2022  ? EOSABS 0.1 04/30/2022  ? BASOSABS 0.1 04/30/2022  ? ? ? ?Last metabolic panel ?Lab Results  ?Component Value Date  ? NA 136 04/30/2022  ? K 3.6 04/30/2022  ? CL 105 04/30/2022  ? CO2 25 04/30/2022  ? BUN 10 04/30/2022  ? CREATININE 0.78 04/30/2022  ? GLUCOSE 139 (H) 04/30/2022  ? GFRNONAA >60 04/30/2022  ? GFRAA >60 07/19/2020  ? CALCIUM 8.7 (L) 04/30/2022  ? PHOS 3.4 06/11/2020  ? PROT 6.5 04/30/2022  ? ALBUMIN 3.8 04/30/2022  ? LABGLOB 2.2 07/31/2019  ? AGRATIO 2.0 07/31/2019  ? BILITOT 0.5 04/30/2022  ? ALKPHOS 71 04/30/2022  ? AST 21 04/30/2022  ? ALT 21 04/30/2022  ? ANIONGAP 8 04/30/2022  ? ? ?GFR: ?Estimated Creatinine Clearance: 70.6 mL/min (by C-G formula based on SCr of 0.78 mg/dL). ? ?Recent  Results (from the past 240 hour(s))  ?Respiratory (~20 pathogens) panel by PCR     Status: None  ? Collection Time: 04/30/22 11:27 AM  ? Specimen: Nasopharyngeal Swab; Respiratory  ?Result Value Ref Range Status  ? Adenovirus NOT DETECTED NOT DETECTED Final  ? Coronavirus 229E NOT DETECTED NOT DETECTED Final  ?  Comment:  (NOTE) ?The Coronavirus on the Respiratory Panel, DOES NOT test for the novel  ?Coronavirus (2019 nCoV) ?  ? Coronavirus HKU1 NOT DETECTED NOT DETECTED Final  ? Coronavirus NL63 NOT DETECTED NOT DETECTED Final  ? Coronavirus OC43 NOT DETECTED NOT DETECTED Final  ? Metapneumovirus NOT DETECTED NOT DETECTED Final  ? Rhinovirus / Enterovirus NOT DETECTED NOT DETECTED Final  ? Influenza A NOT DETECTED NOT DETECTED Final  ? Influenza B NOT DETECTED NOT DETECTED Final  ? Parainfluenza Virus 1 NOT DETECTED NOT DETECTED Final  ? Parainfluenza Virus 2 NOT DETECTED NOT DETECTED Final  ? Parainfluenza Virus 3 NOT DETECTED NOT DETECTED Final  ? Parainfluenza Virus 4 NOT DETECTED NOT DETECTED Final  ? Respiratory Syncytial Virus NOT DETECTED NOT DETECTED Final  ? Bordetella pertussis NOT DETECTED NOT DETECTED Final  ? Bordetella Parapertussis NOT DETECTED NOT DETECTED Final  ? Chlamydophila pneumoniae NOT DETECTED NOT DETECTED Final  ? Mycoplasma pneumoniae NOT DETECTED NOT DETECTED Final  ?  Comment: Performed at [facility], [place] [address], [place], [place] [address]  ?  ? ? ?Radiology Studies: ?DG Chest 2 View ? ?Result Date: 05/01/2022 ?CLINICAL DATA:  Decreased breath sounds RIGHT mid lung, shortness of breath for 1 week EXAM: CHEST - 2 VIEW COMPARISON:  04/30/2022 FINDINGS: Normal heart size, mediastinal contours, and pulmonary vascularity. Atherosclerotic calcification aorta. Lungs clear. No acute infiltrate, pleural effusion, or pneumothorax. Prior cervical spine fusions. IMPRESSION: No acute abnormalities. Aortic Atherosclerosis (ICD10-I70.0). Electronically Signed   By: Lavonia Dana M.D.   On: 05/01/2022 14:01  ? ?DG Chest Port 1 View ? ?Result Date: 04/30/2022 ?CLINICAL DATA:  Respiratory distress EXAM: PORTABLE CHEST 1 VIEW COMPARISON:  CT chest dated 03/08/2022 FINDINGS: Lungs are clear.  No pleural effusion or pneumothorax. The heart is normal in size. Cervical spine fixation hardware, incompletely visualized.  IMPRESSION: No evidence of acute cardiopulmonary disease. Electronically Signed   By: Julian Hy M.D.   On: 04/30/2022 02:16   ? ? ? LOS: 1 day  ? ? ?Cordelia Poche, MD ?[facility] ?05/01/2022, 4:52 PM ? ? ?If 7PM-7AM, please contact night-coverage [email or web address] ? ?

## 2022-05-01 NOTE — Telephone Encounter (Signed)
Pt called to let Dr. Jenny Reichmann she was admitted yesterday into HOS. ? ?FYI ?

## 2022-05-01 NOTE — Progress Notes (Signed)
Initial Nutrition Assessment ? ?DOCUMENTATION CODES:  ? ?Not applicable ? ?INTERVENTION:  ?Provide Ensure Enlive po BID, each supplement provides 350 kcal and 20 grams of protein. ? ?Encourage adequate PO intake.  ? ?NUTRITION DIAGNOSIS:  ? ?Increased nutrient needs related to chronic illness (COPD) as evidenced by estimated needs. ? ?GOAL:  ? ?Patient will meet greater than or equal to 90% of their needs ? ?MONITOR:  ? ?PO intake, Supplement acceptance, Labs, Weight trends, Skin, I & O's ? ?REASON FOR ASSESSMENT:  ? ?Consult ?Assessment of nutrition requirement/status ? ?ASSESSMENT:  ? ?61 y.o. female with medical history significant of HTN, COPD/asthma overlap syndrome presents with several day worsening SOB, wheezing, respiratory distress. ? ?Meal completion has been 100%. Pt reports having a good appetite currently. Pt does report having varied po intake prior to admission with 1-3 meals a day and snacks in between. RD to order nutritional supplements to aid in caloric and protein needs. Pt encouraged to eat her food at meals and to drink her supplements.  ? ?NUTRITION - FOCUSED PHYSICAL EXAM: ? ?Flowsheet Row Most Recent Value  ?Orbital Region No depletion  ?Upper Arm Region No depletion  ?Thoracic and Lumbar Region No depletion  ?Buccal Region No depletion  ?Temple Region No depletion  ?Clavicle Bone Region No depletion  ?Clavicle and Acromion Bone Region No depletion  ?Scapular Bone Region Unable to assess  ?Dorsal Hand No depletion  ?Patellar Region No depletion  ?Anterior Thigh Region No depletion  ?Posterior Calf Region No depletion  ?Edema (RD Assessment) None  ?Hair Reviewed  ?Eyes Reviewed  ?Mouth Reviewed  ?Skin Reviewed  ?Nails Reviewed  ? ?  ? ?Labs and medications reviewed.  ? ?Diet Order:   ?Diet Order   ? ?       ?  Diet regular Room service appropriate? Yes; Fluid consistency: Thin  Diet effective now       ?  ? ?  ?  ? ?  ? ? ?EDUCATION NEEDS:  ? ?Not appropriate for education at this  time ? ?Skin:  Skin Assessment: Reviewed RN Assessment ? ?Last BM:  5/14 ? ?Height:  ? ?Ht Readings from Last 1 Encounters:  ?04/30/22 '5\' 3"'$  (1.6 m)  ? ? ?Weight:  ? ?Wt Readings from Last 1 Encounters:  ?05/01/22 69.2 kg  ? ?BMI:  Body mass index is 27.01 kg/m?. ? ?Estimated Nutritional Needs:  ? ?Kcal:  1850-2050 ? ?Protein:  85-100 grams ? ?Fluid:  >/= 1.8 L/day ? ?Corrin Parker, MS, RD, LDN ?RD pager number/after hours weekend pager number on Amion. ? ?

## 2022-05-01 NOTE — Consult Note (Signed)
? ?  [facility] CM Inpatient Consult ? ? ?05/01/2022 ? ?[name] ?11/21/1961 ?[id number] ? ?[facility] [[facility]]  Accountable Care Organization [ACO] Patient: [name] ? ?Primary Care Provider:  John, James W, MD is with [facility], is an embedded provider with a Chronic Care Management team and program, and is listed for the transition of care follow up and appointments. ? ?1030 am: Met with the patient at the bedside to follow up with post hospital needs.  She endorses Dr. John as her PCP.  She asked if this writer would be assisting her to go to rehab. Patient states that the doctor said that she may need to go to rehab. Explained [facility] Embedded team for post hospital community follow up and support. She states she is breathing a little better today. ? ?She states that she does not have any issues with transportation, food, and support.  She states her friend and family are available. ? ?Patient was screened for Embedded practice service needs for chronic care management ? ?Plan: Continue to follow for progress and needs.  A referral can be sent to the [facility] Embedded Care Management if returning community and needs arise for care coordination. ? ?Please contact for further questions, ? ? , RN BSN CCM ?[facility] Hospital Liaison ? [phone] business mobile phone ?Toll free office [phone]  ?Fax number: [phone] [email or web address] ? ? ? ?

## 2022-05-01 NOTE — Progress Notes (Signed)
Mobility Specialist Progress Note: ? ? 05/01/22 1100  ?Mobility  ?Activity Ambulated with assistance in hallway  ?Level of Assistance Contact guard assist, steadying assist  ?Assistive Device Other (Comment) ?(HHA)  ?Distance Ambulated (ft) 470 ft  ?Activity Response Tolerated well  ?$Mobility charge 1 Mobility  ? ?Pt eager for mobility session. Required HHA throughout for steadying. Pt requiring 0.5LO2 to maintain SpO2 >90%. Pt back in bed with all needs met. ? ?[place] Numbers ?Acute Rehab ?Secure Chat or ?Office Phone: [phone] ? ?

## 2022-05-02 DIAGNOSIS — J9601 Acute respiratory failure with hypoxia: Secondary | ICD-10-CM | POA: Diagnosis not present

## 2022-05-02 DIAGNOSIS — J9602 Acute respiratory failure with hypercapnia: Secondary | ICD-10-CM | POA: Diagnosis not present

## 2022-05-02 LAB — BASIC METABOLIC PANEL
Anion gap: 7 (ref 5–15)
BUN: 17 mg/dL (ref 6–20)
CO2: 29 mmol/L (ref 22–32)
Calcium: 8.6 mg/dL — ABNORMAL LOW (ref 8.9–10.3)
Chloride: 104 mmol/L (ref 98–111)
Creatinine, Ser: 0.64 mg/dL (ref 0.44–1.00)
GFR, Estimated: >60 mL/min (ref >60)
Glucose, Bld: 99 mg/dL (ref 70–99)
Potassium: 3.5 mmol/L (ref 3.5–5.1)
Sodium: 140 mmol/L (ref 135–145)

## 2022-05-02 LAB — CBC
HCT: 28.1 % — ABNORMAL LOW (ref 36.0–46.0)
Hemoglobin: 8.9 g/dL — ABNORMAL LOW (ref 12.0–15.0)
MCH: 27.6 pg (ref 26.0–34.0)
MCHC: 31.7 g/dL (ref 30.0–36.0)
MCV: 87 fL (ref 80.0–100.0)
Platelets: 315 10*3/uL (ref 150–400)
RBC: 3.23 MIL/uL — ABNORMAL LOW (ref 3.87–5.11)
RDW: 14.7 % (ref 11.5–15.5)
WBC: 17.3 10*3/uL — ABNORMAL HIGH (ref 4.0–10.5)
nRBC: 0 % (ref 0.0–0.2)

## 2022-05-02 MED ORDER — PREDNISONE 10 MG PO TABS: ORAL_TABLET | ORAL | 0 refills | Status: AC

## 2022-05-02 NOTE — TOC Transition Note (Addendum)
Transition of Care (TOC) - CM/SW Discharge Note ? ? ?Patient Details  ?Name: [name] ?MRN: [id number] ?Date of Birth: [birth year] ? ?Transition of Care (TOC) CM/SW Contact:  ?Zenon Mayo, RN ?Phone Number: ?05/02/2022, 11:38 AM ? ? ?Clinical Narrative:    ?NCM spoke with patient at the bedside, she has a cane and a walker at home.  She states her daughter will transport her home today.  NCM offered choice for HHPT, she states she would like [facility].  NCM made referral to [facility] with Alvis Lemmings, soc will begin 24 to 48 hrs post dc. NCM has consult for cpap or possible triology for patient ,  Per MD states  he don't know that she needs it - she follows with Pulm next week and would defer to them.    ? ? ?Final next level of care: [place] ?Barriers to Discharge: No Barriers Identified ? ? ?Patient Goals and CMS Choice ?Patient states their goals for this hospitalization and ongoing recovery are:: return home ?CMS Medicare.gov Compare Post Acute Care list provided to:: Patient ?Choice offered to / list presented to : Patient ? ?Discharge Placement ?  ?           ?  ?  ?  ?  ? ?Discharge Plan and Services ?  ?  ?           ?  ?DME Agency: NA ?  ?  ?  ?HH Arranged: PT ?[place] Agency: [place] ?Date HH Agency Contacted: 05/02/22 ?Time [place]: [id number] ?Representative spoke with at [place]: Tommi Rumps ? ?Social Determinants of Health (SDOH) Interventions ?  ? ? ?Readmission Risk Interventions ?   ? View : No data to display.  ?  ?  ?  ? ? ? ? ? ?

## 2022-05-02 NOTE — Progress Notes (Signed)
Pt shows no sign of respiratory distress.  She had her midnight neb TX and requests to not wear BIPAP unless she needs it. ?

## 2022-05-02 NOTE — Progress Notes (Signed)
Physical Therapy Treatment ?Patient Details ?Name: [name] ?MRN: [id number] ?DOB: 24-Feb-1961 ?Today's Date: 05/02/2022 ? ? ?History of Present Illness Pt is a 61 y/o female presenting on 5/15 with respiratory distress.  Admitted with acute respiratory failure with hypoxia and hypercapnia. PMH includes: HTN, COPD/asthma overlap syndrome, bipolar, PNA, seizure, CVA, arthritis, fibromyalgia, anxiety, hx ACDF. ? ?  ?PT Comments  ? ? Pt was able to ambulate without UE support today, initially with fairly good stability and no LOB. However, as distance progressed pt became "dizzy" and had an occasional LOB and increased instability, needing intermittent minA. SpO2 remained >/= 90% on RA with HR stable. See General Comments below in regards to BP. Educated pt due to her inconsistent lower extremity strength and stability this PT recommends using her RW at all times when standing/ambulating to reduce her risk for falls. She verbalized understanding. Pt also reporting she could stay with her daughter temporarily if needed. This PT encouraged her to stay with family initially until her balance improves. She verbalized understanding. Updated d/c recs to HHPT as pt reports having support at home and is needing very little occasional assistance. Will continue to follow acutely. ? ?  ?Recommendations for follow up therapy are one component of a multi-disciplinary discharge planning process, led by the attending physician.  Recommendations may be updated based on patient status, additional functional criteria and insurance authorization. ? ?Follow Up Recommendations ? Home health PT ?  ?  ?Assistance Recommended at Discharge Intermittent Supervision/Assistance  ?Patient can return home with the following A little help with walking and/or transfers;A little help with bathing/dressing/bathroom;Assistance with cooking/housework;Assist for transportation;Help with stairs or ramp for entrance ?  ?Equipment  Recommendations ? None recommended by PT  ?  ?Recommendations for Other Services   ? ? ?  ?Precautions / Restrictions Precautions ?Precautions: Other (comment);Fall ?Precaution Comments: watch O2 ?Restrictions ?Weight Bearing Restrictions: No  ?  ? ?Mobility ? Bed Mobility ?Overal bed mobility: Modified Independent ?Bed Mobility: Supine to Sit ?  ?  ?Supine to sit: Modified independent (Device/Increase time) ?  ?  ?General bed mobility comments: Mod I, no need for assistance, HOB elevated. ?  ? ?Transfers ?Overall transfer level: Needs assistance ?Equipment used: None ?Transfers: Sit to/from Stand ?Sit to Stand: Supervision ?  ?  ?  ?  ?  ?General transfer comment: Supervision for safety ?  ? ?Ambulation/Gait ?Ambulation/Gait assistance: Min guard, Min assist ?Gait Distance (Feet): 400 Feet (x2 bouts of ~400 ft > ~180 ft) ?Assistive device: None ?Gait Pattern/deviations: Step-through pattern, Decreased stride length ?Gait velocity: reduced ?Gait velocity interpretation: <1.8 ft/sec, indicate of risk for recurrent falls ?  ?General Gait Details: Pt initially with fairly steady, fluid but slowed gait with no UE support or LOB, min guard for safety. Slows gait to change head positions, but no LOB. As distance progressed pt became increasingly "dizzy" and had a LOB needing minA to recover. During second bout, pt still "dizzy" and needing min guard-minA for safety. When standing for second bout she became shaky and returned to sit. Educated pt to use her RW at home for safety. She verbalized understanding. ? ? ?Stairs ?  ?  ?  ?  ?  ? ? ?Wheelchair Mobility ?  ? ?Modified Rankin (Stroke Patients Only) ?  ? ? ?  ?Balance Overall balance assessment: History of Falls, Needs assistance ?Sitting-balance support: Feet supported, No upper extremity supported ?Sitting balance-Leahy Scale: Good ?  ?  ?Standing balance support: During functional activity,  No upper extremity supported ?Standing balance-Leahy Scale: Fair ?Standing  balance comment: Able to ambulate without UE support, but steadiness is inconsistent, sometimes needing no assistance and sometimes needing minA ?  ?  ?  ?  ?  ?  ?  ?  ?  ?  ?  ?  ? ?  ?Cognition Arousal/Alertness: Awake/alert ?Behavior During Therapy: [facility] for tasks assessed/performed ?Overall Cognitive Status: Within Functional Limits for tasks assessed ?  ?  ?  ?  ?  ?  ?  ?  ?  ?  ?  ?  ?  ?  ?  ?  ?General Comments: aware of her deficits ?  ?  ? ?  ?Exercises   ? ?  ?General Comments General comments (skin integrity, edema, etc.): Bil UE strength grossly 4 to 4+/5 bil; SpO2 >/= 90% on RA throughout; BP 127/114 seated after first gait bout, 166/96 seated a few min later, 157/99 standing, 137/102 while ambulating second bout ?  ?  ? ?Pertinent Vitals/Pain Pain Assessment ?Pain Assessment: Faces ?Faces Pain Scale: No hurt ?Pain Intervention(s): Monitored during session  ? ? ?Home Living   ?  ?  ?  ?  ?  ?  ?  ?  ?  ?   ?  ?Prior Function    ?  ?  ?   ? ?PT Goals (current goals can now be found in the care plan section) Acute Rehab PT Goals ?Patient Stated Goal: to get home soon ?PT Goal Formulation: With patient ?Time For Goal Achievement: 05/14/22 ?Potential to Achieve Goals: Good ?Progress towards PT goals: Progressing toward goals ? ?  ?Frequency ? ? ? Min 3X/week ? ? ? ?  ?PT Plan Discharge plan needs to be updated  ? ? ?Co-evaluation   ?  ?  ?  ?  ? ?  ?AM-PAC PT "6 Clicks" Mobility   ?Outcome Measure ? Help needed turning from your back to your side while in a flat bed without using bedrails?: None ?Help needed moving from lying on your back to sitting on the side of a flat bed without using bedrails?: None ?Help needed moving to and from a bed to a chair (including a wheelchair)?: A Little ?Help needed standing up from a chair using your arms (e.g., wheelchair or bedside chair)?: A Little ?Help needed to walk in hospital room?: A Little ?Help needed climbing 3-5 steps with a railing? : A Little ?6 Click  Score: 20 ? ?  ?End of Session Equipment Utilized During Treatment: Gait belt ?Activity Tolerance: Patient tolerated treatment well ?Patient left: with call bell/phone within reach;in chair ?Nurse Communication: Mobility status;Other (comment) (vitals) ?PT Visit Diagnosis: Unsteadiness on feet (R26.81);Difficulty in walking, not elsewhere classified (R26.2);Other abnormalities of gait and mobility (R26.89) ?  ? ? ?Time: [phone] ?PT Time Calculation (min) (ACUTE ONLY): 25 min ? ?Charges:  $Gait Training: 23-37 mins          ?          ? ?Moishe Spice, PT, DPT ?Acute Rehabilitation Services  ?Pager: [phone] ?Office: [phone] ? ? ? ?[name] ?05/02/2022, 10:03 AM ? ?

## 2022-05-02 NOTE — Progress Notes (Signed)
SATURATION QUALIFICATIONS: (This note is used to comply with regulatory documentation for home oxygen) ? ?Patient Saturations on Room Air at Rest = 95% ? ?Patient Saturations on Room Air while Ambulating = 90% ? ? ?Please briefly explain why patient needs home oxygen: Pt is able to maintain sats >/= 90% on RA at rest and with mobility and thus does not need supplemental O2. ? ? ?Moishe Spice, PT, DPT ?Acute Rehabilitation Services  ?Pager: [phone] ?Office: [phone] ? ?

## 2022-05-02 NOTE — Discharge Summary (Signed)
?Physician Discharge Summary ?  ?Patient: [name] MRN: [id number] DOB: Apr 28, 1961  ?Admit date:     04/30/2022  ?Discharge date: 05/02/22  ?Discharge Physician: Little Ishikawa  ? ?PCP: Biagio Borg, MD  ? ?Recommendations at discharge:  ? ?Follow-up with PCP as scheduled, follow-up with pulmonology next week as planned to discuss inhaler regimen and medication changes as well as follow-up with allergist as discussed ? ?Discharge Diagnoses: ?Principal Problem: ?  Acute respiratory failure with hypoxia and hypercapnia (HCC) ?Active Problems: ?  Seizure disorder ([place]) ?  Asthma-COPD overlap syndrome ([place]) ?  Status asthmaticus ?  Essential (primary) hypertension ?  COPD exacerbation ([place]) ?  Allergic rhinitis ? ?Resolved Problems: ?  * No resolved hospital problems. * ? ?Hospital Course: ?Patient admitted as above with acute hypoxic respiratory failure with profound resolution of symptoms after initiating high-dose steroids and inhalers.  Patient now ambulating without any further hypoxia or symptomatology and stable for discharge. ? ?Assessment and Plan: ?* Acute respiratory failure with hypoxia and hypercapnia (HCC) ?Likely asthma related. Recent exposure to flowers which may have triggered. Patient treated with Solu-medrol, magnesium, albuterol initially. Started on BiPAP for work of breathing. Patient improving but not near baseline. PT/OT recommending SNF at this time. RVP negative. ?-Continue steroid taper with prednisone, taper back to 10 mg daily dose ?-Continue nebulizers and inhalers at home as discussed, follow-up with pulmonology next week for further discussion on trilogy device as well as for possible evaluation for sleep apnea given nocturnal dyspnea and hypoxia here in the hospital ? ?Seizure disorder ([place]) ?-Continue home Tegretol and gabapentin ? ?COPD exacerbation ([place]) ?See #1 as above ? ?Essential (primary) hypertension ?Blood pressure well controlled. ?-Hold home irbesartan  and hydrochlorothiazide ? ?Asthma-COPD overlap syndrome ([place]) ?Patient is on [place] as an outpatient. See above ? ?Allergic rhinitis ?-Continue Claritin (substituted for home Xyzal) and Singulair ? ? ? ?  ? ? ?Consultants: None ?Procedures performed: None none ?Disposition: Home ?Diet recommendation:  ?Regular diet ?DISCHARGE MEDICATION: ?Allergies as of 05/02/2022   ? ?   Reactions  ? Sulfa Antibiotics Nausea And Vomiting  ? Venofer [iron Sucrose] Hives  ? ?  ? ?  ?Medication List  ?  ? ?TAKE these medications   ? ?acetaminophen 325 MG tablet ?Commonly known as: TYLENOL ?Take 2 tablets (650 mg total) by mouth every 6 (six) hours as needed for mild pain (or Fever >/= 101). ?  ?albuterol (2.5 MG/3ML) 0.083% nebulizer solution ?Commonly known as: PROVENTIL ?Take 3 mLs (2.5 mg total) by nebulization every 6 (six) hours as needed for wheezing or shortness of breath. Reported on 06/04/2016 ?  ?albuterol 108 (90 Base) MCG/ACT inhaler ?Commonly known as: VENTOLIN HFA ?Inhale 2 puffs into the lungs every 4 (four) hours as needed for wheezing or shortness of breath. ?  ?atorvastatin 40 MG tablet ?Commonly known as: LIPITOR ?TAKE 1 TABLET(40 MG) BY MOUTH DAILY ?What changed: See the new instructions. ?  ?Atrovent HFA 17 MCG/ACT inhaler ?Generic drug: ipratropium ?Inhale 2 puffs into the lungs every 6 (six) hours as needed for wheezing. ?  ?Belsomra 15 MG Tabs ?Generic drug: Suvorexant ?Take 15 mg by mouth at bedtime. ?  ?Biotin 1000 MCG tablet ?Take 1,000 mcg by mouth daily. ?  ?Breztri Aerosphere 160-9-4.8 MCG/ACT Aero ?Generic drug: Budeson-Glycopyrrol-Formoterol ?INHALE 2 PUFFS INTO THE LUNGS TWICE DAILY WITH SPACER ?What changed:  ?how much to take ?how to take this ?when to take this ?additional instructions ?  ?carbamazepine 200 MG  tablet ?Commonly known as: TEGretol ?Take 2 tablets (400 mg total) by mouth 2 (two) times daily. ?  ?cetirizine 10 MG tablet ?Commonly known as: ZYRTEC ?Can take one tablet by mouth once  daily if needed for runny nose. ?What changed:  ?how much to take ?how to take this ?when to take this ?reasons to take this ?additional instructions ?  ?cyclobenzaprine 5 MG tablet ?Commonly known as: FLEXERIL ?TAKE 1 TABLET(5 MG) BY MOUTH THREE TIMES DAILY AS NEEDED FOR MUSCLE SPASMS ?What changed:  ?how much to take ?how to take this ?when to take this ?reasons to take this ?additional instructions ?  ?diclofenac Sodium 1 % Gel ?Commonly known as: VOLTAREN ?Apply 4 g topically 4 (four) times daily. ?What changed:  ?when to take this ?reasons to take this ?  ?Dupixent 300 MG/2ML prefilled syringe ?Generic drug: dupilumab ?Inject 300 mg into the skin every 28 (twenty-eight) days. ?  ?estradiol 0.5 MG tablet ?Commonly known as: ESTRACE ?Take 1 tablet (0.5 mg total) by mouth daily. ?  ?famotidine 20 MG tablet ?Commonly known as: PEPCID ?Take 20 mg by mouth daily as needed for heartburn or indigestion. OTC ?  ?fluticasone 50 MCG/ACT nasal spray ?Commonly known as: FLONASE ?Place 1 spray into both nostrils 2 (two) times daily as needed for allergies or rhinitis. ?  ?gabapentin 100 MG capsule ?Commonly known as: NEURONTIN ?Take 1 capsule (100 mg total) by mouth 3 (three) times daily. ?  ?hydrochlorothiazide 12.5 MG capsule ?Commonly known as: MICROZIDE ?TAKE 1 CAPSULE(12.5 MG) BY MOUTH DAILY ?What changed: See the new instructions. ?  ?hydrOXYzine 25 MG capsule ?Commonly known as: VISTARIL ?Take 1 when angry and if not calmer in 1 hour repeat ?What changed:  ?how much to take ?how to take this ?when to take this ?additional instructions ?  ?irbesartan 75 MG tablet ?Commonly known as: AVAPRO ?TAKE 1 TABLET(75 MG) BY MOUTH DAILY ?What changed: See the new instructions. ?  ?levocetirizine 5 MG tablet ?Commonly known as: XYZAL ?Take 1 tablet (5 mg total) by mouth every evening. ?  ?montelukast 10 MG tablet ?Commonly known as: SINGULAIR ?TAKE 1 TABLET(10 MG) BY MOUTH AT BEDTIME ?What changed: See the new instructions. ?   ?ondansetron 4 MG disintegrating tablet ?Commonly known as: Zofran ODT ?Take 1 tablet (4 mg total) by mouth every 8 (eight) hours as needed for nausea or vomiting. ?  ?pantoprazole 40 MG tablet ?Commonly known as: PROTONIX ?TAKE 1 TABLET BY MOUTH EVERY DAY ?What changed:  ?how much to take ?how to take this ?when to take this ?additional instructions ?  ?potassium chloride 10 MEQ tablet ?Commonly known as: KLOR-CON M ?Take 1 tablet (10 mEq total) by mouth daily. ?  ?predniSONE 10 MG tablet ?Commonly known as: DELTASONE ?Take 1 tablet (10 mg total) by mouth daily with breakfast. ?What changed:  ?when to take this ?additional instructions ?  ?predniSONE 10 MG tablet ?Commonly known as: DELTASONE ?Take 4 tablets (40 mg total) by mouth daily for 4 days, THEN 3 tablets (30 mg total) daily for 4 days, THEN 2 tablets (20 mg total) daily for 4 days. Then transition back to '10mg'$  daily tablets as previously taking.Marland Kitchen ?Start taking on: May 02, 2022 ?What changed: You were already taking a medication with the same name, and this prescription was added. Make sure you understand how and when to take each. ?  ?triamcinolone ointment 0.1 % ?Commonly known as: KENALOG ?Apply 1 application topically 2 (two) times daily. ?What changed:  ?when  to take this ?reasons to take this ?  ?UNKNOWN TO PATIENT ?Weekly allergy shots ?  ? ?  ? ? Follow-up Information   ? ? Care, [facility] Follow up.   ?Specialty: Home Health Services ?Why: HHPT ?Contact information: ?[address] ?[address] ?[place] [address] ?[phone] ? ? ?  ?  ? ?  ?  ? ?  ? ?Discharge Exam: ?Filed Weights  ? 04/30/22 0152 05/01/22 0512 05/02/22 0116  ?Weight: 69.4 kg 69.2 kg 69.8 kg  ? ? ? ?Condition at discharge: good ? ?The results of significant diagnostics from this hospitalization (including imaging, microbiology, ancillary and laboratory) are listed below for reference.  ? ?Imaging Studies: ?DG Chest 2 View ? ?Result Date: 05/01/2022 ?CLINICAL DATA:   Decreased breath sounds RIGHT mid lung, shortness of breath for 1 week EXAM: CHEST - 2 VIEW COMPARISON:  04/30/2022 FINDINGS: Normal heart size, mediastinal contours, and pulmonary vascularity. Atherosclerotic ca

## 2022-05-08 ENCOUNTER — Ambulatory Visit (INDEPENDENT_AMBULATORY_CARE_PROVIDER_SITE_OTHER): Payer: Medicare HMO | Admitting: Internal Medicine

## 2022-05-08 ENCOUNTER — Telehealth: Payer: Self-pay | Admitting: Internal Medicine

## 2022-05-08 ENCOUNTER — Other Ambulatory Visit: Payer: Self-pay | Admitting: Internal Medicine

## 2022-05-08 VITALS — BP 132/80 | HR 72 | Temp 99.0°F | Ht 63.0 in | Wt 149.0 lb

## 2022-05-08 DIAGNOSIS — J4540 Moderate persistent asthma, uncomplicated: Secondary | ICD-10-CM

## 2022-05-08 DIAGNOSIS — R7303 Prediabetes: Secondary | ICD-10-CM | POA: Diagnosis not present

## 2022-05-08 DIAGNOSIS — F419 Anxiety disorder, unspecified: Secondary | ICD-10-CM

## 2022-05-08 DIAGNOSIS — I10 Essential (primary) hypertension: Secondary | ICD-10-CM | POA: Diagnosis not present

## 2022-05-08 DIAGNOSIS — Z1231 Encounter for screening mammogram for malignant neoplasm of breast: Secondary | ICD-10-CM

## 2022-05-08 MED ORDER — TRIAMCINOLONE ACETONIDE 0.1 % EX OINT: 1.0000 "application " | TOPICAL_OINTMENT | Freq: Two times a day (BID) | CUTANEOUS | 1 refills | Status: DC

## 2022-05-08 NOTE — Progress Notes (Signed)
Patient ID: [name], female   DOB: 08-25-1961, 61 y.o.   MRN: [id number]        Chief Complaint: follow up recent hospn 5/15 - 5/17       HPI:  [name] is a 61 y.o. female here post hospn with copd exacerbation after exposure at mothers day dinner of brother cologne; really set her off with severe reaction bronchospasm and hypoxia; started initially with Bipap and high dose steroid and inhaler, soon improved to ambulating w/o futher hypoxia and felt stable for d/c.  [name]/OT recommended SNF but [name] declined.  Dc on prednisone taper back to baseline 10 mg qd, asked to f/u here and with pulmonary for possible trelegy device, and consider possible OSA eval as well.  BP well controlled at hosp and irbesartan HCT held.  No other new complaints  [name] denies chest pain, increased sob or doe, wheezing, orthopnea, PND, increased LE swelling, palpitations, dizziness or syncope.   [name] denies polydipsia, polyuria, or new focal neuro s/s.    [name] denies fever, wt loss, night sweats, loss of appetite, or other constitutional symptoms         Wt Readings from Last 3 Encounters:  05/10/22 148 lb 12.8 oz (67.5 kg)  05/08/22 149 lb (67.6 kg)  05/02/22 153 lb 14.1 oz (69.8 kg)   BP Readings from Last 3 Encounters:  05/10/22 132/80  05/08/22 132/80  05/02/22 (!) 147/89         Past Medical History:  Diagnosis Date   Anemia, iron deficiency 12/22/2014   [name]. 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      Bipolar disorder ([place])    COPD (chronic obstructive pulmonary disease) ([place])    Depression    Fibromyalgia 05/14/2014   GERD (gastroesophageal reflux disease)    History of kidney stones    Hyperlipidemia 04/20/2017   HYPERTENSION 05/12/2009   Qualifier: Diagnosis of  By:  Lenn Cal Deborra Medina), [name]     Kidney stones    Peripheral vascular disease ([place])    Pneumonia    Prediabetes 02/23/2014   [name]. denies   Seizure ([place])    Stroke ([place]) 11/2020   Urticaria    Past Surgical History:  Procedure Laterality Date   ABDOMINAL HYSTERECTOMY     ANTERIOR CERVICAL DECOMP/DISCECTOMY FUSION N/A 07/28/2020   Procedure: ANTERIOR CERVICAL DECOMPRESSION/DISCECTOMY FUSION. INTERBODY PROTHESIS, PLATE/SCREWS CERVICAL THREE- CERVICAL FOUR, CERVICAL FOUR- CERVICAL FIVE;  Surgeon: Newman Pies, MD;  Location: [facility];  Service: Neurosurgery;  Laterality: N/A;   BACK SURGERY     COLONOSCOPY  12/20/2011   Procedure: COLONOSCOPY;  Surgeon: Landry Dyke, MD;  Location: [facility];  Service: Endoscopy;  Laterality: N/A;   COLONOSCOPY  03/05/2012   Procedure: COLONOSCOPY;  Surgeon: Landry Dyke, MD;  Location: [facility];  Service: Endoscopy;  Laterality: N/A;   DIAGNOSTIC LAPAROSCOPY     HEMORRHOID SURGERY     INCISE AND DRAIN ABCESS     KIDNEY STONE SURGERY     NECK SURGERY     x 2 Dr Orinda Kenner   SPINE SURGERY  2019   TOE SURGERY     TUBAL LIGATION      reports that she quit smoking about 3 years ago. Her smoking use included cigarettes.  She has a 9.00 pack-year smoking history. She has never used smokeless tobacco. She reports that she does not currently use alcohol. She reports current drug use. Drug: Marijuana. family history includes Alcohol abuse in her brother and father; Anemia in her daughter, sister, and sister; Anesthesia problems in her father; Asthma in her brother, mother, and sister; Brain cancer in her sister; COPD in her mother and sister; Colon cancer (age of onset: 18) in her father; Diabetes in her brother, father, mother, sister, sister, sister, and sister; Heart disease in her brother, brother, mother, and sister; Hypertension in her brother, daughter, sister, sister, sister, sister, and sister; Kidney disease in her father; Sleep apnea in her  brother. Allergies  Allergen Reactions   Sulfa Antibiotics Nausea And Vomiting   Venofer [Iron Sucrose] Hives   Current Outpatient Medications on File Prior to Visit  Medication Sig Dispense Refill   acetaminophen (TYLENOL) 325 MG tablet Take 2 tablets (650 mg total) by mouth every 6 (six) hours as needed for mild pain (or Fever >/= 101).     albuterol (PROVENTIL) (2.5 MG/3ML) 0.083% nebulizer solution Take 3 mLs (2.5 mg total) by nebulization every 6 (six) hours as needed for wheezing or shortness of breath. Reported on 06/04/2016 120 mL 11   albuterol (VENTOLIN HFA) 108 (90 Base) MCG/ACT inhaler Inhale 2 puffs into the lungs every 4 (four) hours as needed for wheezing or shortness of breath. 18 g 11   atorvastatin (LIPITOR) 40 MG tablet TAKE 1 TABLET(40 MG) BY MOUTH DAILY (Patient taking differently: Take 40 mg by mouth daily.) 90 tablet 1   Biotin 1000 MCG tablet Take 1,000 mcg by mouth daily.     Budeson-Glycopyrrol-Formoterol (BREZTRI AEROSPHERE) 160-9-4.8 MCG/ACT AERO INHALE 2 PUFFS INTO THE LUNGS TWICE DAILY WITH SPACER (Patient taking differently: Inhale 2 puffs into the lungs 2 (two) times daily. WITH SPACER) 10.7 g 5   carbamazepine (TEGRETOL) 200 MG tablet Take 2 tablets (400 mg total) by mouth 2 (two) times daily. 120 tablet 12   cetirizine (ZYRTEC) 10 MG tablet Can take one tablet by mouth once daily if needed for runny nose. (Patient taking differently: Take 10 mg by mouth daily as needed for rhinitis.) 30 tablet 5   cyclobenzaprine (FLEXERIL) 5 MG tablet TAKE 1 TABLET(5 MG) BY MOUTH THREE TIMES DAILY AS NEEDED FOR MUSCLE SPASMS (Patient taking differently: Take 5 mg by mouth 3 (three) times daily as needed for muscle spasms.) 60 tablet 5   diclofenac Sodium (VOLTAREN) 1 % GEL Apply 4 g topically 4 (four) times daily. (Patient taking differently: Apply 4 g topically 4 (four) times daily as needed (pain).) 100 g 0   DUPIXENT 300 MG/2ML prefilled syringe Inject 300 mg into the skin every  28 (twenty-eight) days.     estradiol (ESTRACE) 0.5 MG tablet Take 1 tablet (0.5 mg total) by mouth daily. 90 tablet 4   famotidine (PEPCID) 20 MG tablet Take 20 mg by mouth daily as needed for heartburn or indigestion. OTC     fluticasone (FLONASE) 50 MCG/ACT nasal spray Place 1 spray into both nostrils 2 (two) times daily as needed for allergies or rhinitis. 16 g 5   gabapentin (NEURONTIN) 100 MG capsule Take 1 capsule (100 mg total) by mouth 3 (three) times daily. 90 capsule 3   hydrochlorothiazide (MICROZIDE) 12.5 MG capsule TAKE 1 CAPSULE(12.5 MG) BY MOUTH DAILY (Patient taking differently: Take 12.5 mg by mouth daily.) 90 capsule 1   hydrOXYzine (VISTARIL) 25 MG capsule Take  1 when angry and if not calmer in 1 hour repeat (Patient taking differently: Take 25 mg by mouth See admin instructions. Take 1 capsule when becoming angry and repeat in 1 hour if not calmer.) 45 capsule 6   ipratropium (ATROVENT HFA) 17 MCG/ACT inhaler Inhale 2 puffs into the lungs every 6 (six) hours as needed for wheezing. 1 each 12   irbesartan (AVAPRO) 75 MG tablet TAKE 1 TABLET(75 MG) BY MOUTH DAILY (Patient taking differently: Take 75 mg by mouth daily.) 90 tablet 1   levocetirizine (XYZAL) 5 MG tablet Take 1 tablet (5 mg total) by mouth every evening. 30 tablet 5   montelukast (SINGULAIR) 10 MG tablet TAKE 1 TABLET(10 MG) BY MOUTH AT BEDTIME (Patient taking differently: Take 10 mg by mouth at bedtime.) 30 tablet 0   ondansetron (ZOFRAN ODT) 4 MG disintegrating tablet Take 1 tablet (4 mg total) by mouth every 8 (eight) hours as needed for nausea or vomiting. 20 tablet 0   pantoprazole (PROTONIX) 40 MG tablet TAKE 1 TABLET BY MOUTH EVERY DAY (Patient taking differently: Take 40 mg by mouth daily.) 90 tablet 1   potassium chloride (KLOR-CON) 10 MEQ tablet Take 1 tablet (10 mEq total) by mouth daily. 90 tablet 3   predniSONE (DELTASONE) 10 MG tablet Take 1 tablet (10 mg total) by mouth daily with breakfast. (Patient  taking differently: Take 10 mg by mouth See admin instructions. Qd x 30 days) 30 tablet 0   predniSONE (DELTASONE) 10 MG tablet Take 4 tablets (40 mg total) by mouth daily for 4 days, THEN 3 tablets (30 mg total) daily for 4 days, THEN 2 tablets (20 mg total) daily for 4 days. Then transition back to '10mg'$  daily tablets as previously taking.. 30 tablet 0   Suvorexant (BELSOMRA) 15 MG TABS Take 15 mg by mouth at bedtime. 30 tablet 3   UNKNOWN TO PATIENT Weekly allergy shots     Current Facility-Administered Medications on File Prior to Visit  Medication Dose Route Frequency Provider Last Rate Last Admin   dupilumab (DUPIXENT) prefilled syringe 300 mg  300 mg Subcutaneous Q14 Days Valentina Shaggy, MD   300 mg at 05/09/22 1337        ROS:  All others reviewed and negative.  Objective        PE:  BP 132/80 (BP Location: Right Arm, Patient Position: Sitting, Cuff Size: Large)   Pulse 72   Temp 99 F (37.2 C) (Oral)   Ht '5\' 3"'$  (1.6 m)   Wt 149 lb (67.6 kg)   SpO2 98%   BMI 26.39 kg/m                 Constitutional: [name] appears in NAD               HENT: Head: NCAT.                Right Ear: External ear normal.                 Left Ear: External ear normal.                Eyes: . Pupils are equal, round, and reactive to light. Conjunctivae and EOM are normal               Nose: without d/c or deformity               Neck: Neck supple. Gross normal ROM  Cardiovascular: Normal rate and regular rhythm.                 Pulmonary/Chest: Effort normal and breath sounds without rales or wheezing.                Abd:  Soft, NT, ND, + BS, no organomegaly               Neurological: [name] is alert. At baseline orientation, motor grossly intact               Skin: Skin is warm. No rashes, no other new lesions, LE edema - none               Psychiatric: [name] behavior is normal without agitation   Micro: none  Cardiac tracings I have personally interpreted today:  none  Pertinent  Radiological findings (summarize): none   Lab Results  Component Value Date   WBC 17.3 (H) 05/02/2022   HGB 8.9 (L) 05/02/2022   HCT 28.1 (L) 05/02/2022   PLT 315 05/02/2022   GLUCOSE 99 05/02/2022   CHOL 142 04/11/2021   TRIG 47.0 04/11/2021   HDL 64.30 04/11/2021   LDLCALC 69 04/11/2021   ALT 21 04/30/2022   AST 21 04/30/2022   NA 140 05/02/2022   K 3.5 05/02/2022   CL 104 05/02/2022   CREATININE 0.64 05/02/2022   BUN 17 05/02/2022   CO2 29 05/02/2022   TSH 0.47 04/11/2021   INR 0.9 02/06/2022   HGBA1C 5.6 02/06/2022   MICROALBUR 0.9 04/11/2021   Assessment/Plan:  Terrah Decoster is a 61 y.o. Black or African American [2] female with  has a past medical history of Anemia, iron deficiency (12/22/2014), Anxiety, Arthritis, ASTHMA (05/12/2009), Bipolar disorder ([place]), COPD (chronic obstructive pulmonary disease) ([place]), Depression, Fibromyalgia (05/14/2014), GERD (gastroesophageal reflux disease), History of kidney stones, Hyperlipidemia (04/20/2017), HYPERTENSION (05/12/2009), Kidney stones, Peripheral vascular disease ([place]), Pneumonia, Prediabetes (02/23/2014), Seizure ([place]), Stroke ([place]) (11/2020), and Urticaria.  Moderate persistent asthma without complication Now improved back to baseline prednisone 10 qd and dupixent, inhalers.  [name] has f/u Dr Lamonte Sakai pulm may 25 with PFTs.    Prediabetes Lab Results  Component Value Date   HGBA1C 5.6 02/06/2022   Stable, [name] to continue current medical treatment  - diet, wt control   Essential (primary) hypertension BP Readings from Last 3 Encounters:  05/10/22 132/80  05/08/22 132/80  05/02/22 (!) 147/89   improved, [name] to restart medical treatment [facility]   Anxiety Currently stable, to f/u with psychiatry as planned - bipolar illness, cont current med tx - vistaril  Followup: Return in about 6 months (around 11/08/2022).  Cathlean Cower, MD 05/11/2022 10:22 PM [facility]

## 2022-05-08 NOTE — Telephone Encounter (Signed)
Sre with [facility] calls today with physical therapy orders for PT. Frequency being   Once a week for one week Twice a week for two weeks Once a week for six weeks  Sre also leaves an FYI regarding PT's medication/medication list. Some of the medicines present on the list are not there in person and vice versa.   PT also has a couple of medication interactions  Carbamazepine with Belsomra severity 2  Potassium Chloride with Vistaril severity 2  You can reach Sre for discussing medication and physical therapy order at   [facility]: [phone]

## 2022-05-08 NOTE — Patient Instructions (Addendum)
Please continue all other medications as before, and refills have been done if requested.  Please have the pharmacy call with any other refills you may need.  Please continue your efforts at being more active, low cholesterol diet, and weight control.  Please keep your appointments with your specialists as you may have planned - Pulmonary later this week  Please make an Appointment to return in 6 months, or sooner if needed

## 2022-05-09 ENCOUNTER — Ambulatory Visit (INDEPENDENT_AMBULATORY_CARE_PROVIDER_SITE_OTHER): Payer: Medicare HMO

## 2022-05-09 DIAGNOSIS — J4550 Severe persistent asthma, uncomplicated: Secondary | ICD-10-CM | POA: Diagnosis not present

## 2022-05-09 NOTE — Telephone Encounter (Signed)
Sylvarena for [employer] to continue all meds as is

## 2022-05-09 NOTE — Telephone Encounter (Signed)
Called Sre gave MD response... Sre also want the daughter to know that she fell on Saturday walking. Her (R) knee is very sore. He states she saw MD yesterday.Marland KitchenJohny Chess

## 2022-05-10 ENCOUNTER — Ambulatory Visit (INDEPENDENT_AMBULATORY_CARE_PROVIDER_SITE_OTHER): Payer: Medicare HMO | Admitting: Emergency Medicine

## 2022-05-10 ENCOUNTER — Encounter: Payer: Self-pay | Admitting: Emergency Medicine

## 2022-05-10 DIAGNOSIS — J309 Allergic rhinitis, unspecified: Secondary | ICD-10-CM | POA: Diagnosis not present

## 2022-05-10 DIAGNOSIS — J449 Chronic obstructive pulmonary disease, unspecified: Secondary | ICD-10-CM | POA: Diagnosis not present

## 2022-05-10 NOTE — Assessment & Plan Note (Signed)
Please complete your prednisone taper with 10 mg daily for the next 4 days.  Then stop prednisone altogether. Continue Breztri 2 puffs twice a day.  Rinse and gargle after using. Keep albuterol available to use 2 puffs if needed for shortness of breath, chest tightness, wheezing. Continue Singulair 10 mg each evening Continue your Dupixent per your usual schedule Follow Dr. Lamonte Sakai in 6 months or sooner if you have any problems

## 2022-05-10 NOTE — Progress Notes (Signed)
   Subjective:    Patient ID: [name], female    DOB: [birth year], 61 y.o.   MRN: [id number]  COPD She complains of cough, shortness of breath and wheezing. Associated symptoms include chest pain. Pertinent negatives include no fever, postnasal drip or rhinorrhea. Her past medical history is significant for COPD.     ROV 10/06/20 --follow-up visit 61 year old woman with a history of severe persistent allergic rhinitis, associated asthmatic COPD, treated with Dupixent.  She flares frequently with bronchitic symptoms, recently treated with prednisone and azithromycin in late August after she underwent c-spine sgy. Her cough persisted and she got more pred from Dr Jenny Reichmann middle of this month.  She is on Breztri, reports compliance is good. Her albuterol use has decreased significantly on the breztri. Her cough is resolved (still finishing prednisone). Needs flu shot today. COVID vaccine up to date. Needs the flu shot.   ROV 05/10/22 --61 year old woman with significant allergic rhinitis, associated asthmatic COPD, seizures versus pseudoseizures.  She has been treated with Dupixent, is on prednisone taper currently, Xyzal, Singulair, zyrtec. He was admitted 04/30/2022 with progressive exertional dyspnea.  She was treated for an acute exacerbation of her asthmatic COPD, had documented hypercapnia in the ED.  She required BiPAP support in the emergency department.  Her RVP was negative.  She had nocturnal hypoxemia in the hospital, question due to OSA superimposed on her obstructive lung disease She reports that she is doing better, is taking breztri reliably. Rare albuterol use as long as she is on the breztri. She is on immunotherapy, gets her dupixent from [facility].     Review of Systems  Constitutional:  Positive for fatigue. Negative for chills and fever.  HENT:  Negative for postnasal drip, rhinorrhea and sinus pressure.   Respiratory:  Positive for cough, shortness of breath and  wheezing.   Cardiovascular:  Positive for chest pain. Negative for palpitations and leg swelling.      Objective:   Physical Exam Vitals:   05/10/22 1613  BP: 132/80  Pulse: 81  Temp: 98.2 F (36.8 C)  TempSrc: Oral  SpO2: 98%  Weight: 148 lb 12.8 oz (67.5 kg)  Height: '5\' 3"'$  (1.6 m)   Gen: Pleasant, well-nourished, in no distress,  normal affect  ENT: No lesions,  mouth clear,  oropharynx clear, no postnasal drip  Neck: No JVD, no stridor  Lungs: No use of accessory muscles, very distant, no wheezing or abnormal breath.  She does have prolonged expiration and some soft bilateral wheeze on forced expiration.  Cardiovascular: RRR, heart sounds normal, no murmur or gallops, no peripheral edema  Musculoskeletal: No deformities, no cyanosis or clubbing  Neuro: alert, non focal  Skin: Warm, no lesions or rashes      Assessment & Plan:   Asthma-COPD overlap syndrome (HCC) Please complete your prednisone taper with 10 mg daily for the next 4 days.  Then stop prednisone altogether. Continue Breztri 2 puffs twice a day.  Rinse and gargle after using. Keep albuterol available to use 2 puffs if needed for shortness of breath, chest tightness, wheezing. Continue Singulair 10 mg each evening Continue your Dupixent per your usual schedule Follow Dr. Lamonte Sakai in 6 months or sooner if you have any problems  Allergic rhinitis Continue Xyzal and Zyrtec the way you have been taking them. Continue your allergy shots as directed by Dr. Leana Gamer, MD, PhD 05/10/2022, 4:35 PM [facility] [phone] or if no answer [phone]

## 2022-05-10 NOTE — Patient Instructions (Addendum)
Please complete your prednisone taper with 10 mg daily for the next 4 days.  Then stop prednisone altogether. Continue Breztri 2 puffs twice a day.  Rinse and gargle after using. Keep albuterol available to use 2 puffs if needed for shortness of breath, chest tightness, wheezing. Continue Singulair 10 mg each evening Continue Xyzal and Zyrtec the way you have been taking them. Continue your Dupixent per your usual schedule Continue your allergy shots as directed by Dr. Ernst Bowler Follow Dr. Lamonte Sakai in 6 months or sooner if you have any problems

## 2022-05-10 NOTE — Assessment & Plan Note (Signed)
Continue Xyzal and Zyrtec the way you have been taking them. Continue your allergy shots as directed by Dr. Ernst Bowler

## 2022-05-11 ENCOUNTER — Other Ambulatory Visit: Payer: Self-pay | Admitting: Gastroenterology

## 2022-05-11 ENCOUNTER — Encounter: Payer: Self-pay | Admitting: Internal Medicine

## 2022-05-11 DIAGNOSIS — A048 Other specified bacterial intestinal infections: Secondary | ICD-10-CM | POA: Diagnosis not present

## 2022-05-11 NOTE — Assessment & Plan Note (Addendum)
BP Readings from Last 3 Encounters:  05/10/22 132/80  05/08/22 132/80  05/02/22 (!) 147/89   improved, pt to restart medical treatment [facility]

## 2022-05-11 NOTE — Assessment & Plan Note (Signed)
Currently stable, to f/u with psychiatry as planned - bipolar illness, cont current med tx - vistaril

## 2022-05-11 NOTE — Assessment & Plan Note (Signed)
Lab Results  Component Value Date   HGBA1C 5.6 02/06/2022   Stable, pt to continue current medical treatment  - diet, wt control

## 2022-05-11 NOTE — Assessment & Plan Note (Signed)
Now improved back to baseline prednisone 10 qd and dupixent, inhalers.  Pt has f/u Dr Lamonte Sakai pulm may 25 with PFTs.

## 2022-05-15 NOTE — Progress Notes (Unsigned)
Subjective:    CC: R knee pain  I, [name], LAT, ATC, am serving as scribe for Dr. Lynne Leader.  HPI: Pt is a 61 y/o female presenting w/ c/o R knee pain and swelling x approximately one week after suffering a fall on Saturday, May 20th, landing on the front of her R knee.  She locates her pain to her R ant proximal tibia.  She has been receiving home health physical therapy after a tough hospitalization with respiratory failure due to asthma/COPD.  Over she overall is improving quite a bit.  R knee swelling:yes along the proximal tibia R knee mechanical symptoms: yes, popping  Aggravating factors: pressure to the area Treatments tried: currently attending home health PT for leg weakness;   Pertinent review of Systems: No fevers or chills  Relevant historical information: Vascular disease.  Asthma/COPD overlap.   Objective:    Vitals:   05/16/22 1051  BP: 112/80  Pulse: 77  SpO2: 95%   General: Well Developed, well nourished, and in no acute distress.   MSK: Right knee: Mild decreased quad bulk.  Contusion present proximal anterior tibia.  Otherwise knee and proximal tibia are normal-appearing Range of motion intact with mild crepitation. Advised patient at the contusion but otherwise knee is nontender. Stable ligamentous exam. Intact strength without pain.  Lab and Radiology Results  Diagnostic Limited MSK Ultrasound of: Right knee Quad tendon intact normal. Mild effusion superior patella  Patellar tendon normal. Medial joint line mild degenerative appearing Lateral joint line normal. Posterior knee no Baker's cyst. Area of contusion proximal tibia bony cortex is normal.  With mild hypoechoic fluid superficial to bony cortex consistent with contusion. 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   X-ray images right knee obtained today personally and independently interpreted Right knee mild DJD.  No acute fractures are  present. Await formal radiology review  Impression and Recommendations:    Assessment and Plan: 61 y.o. female with knee pain after fall.  Mostly due to contusion and exacerbation of medial DJD.  Plan to continue quad strengthening with home health PT.  Certainly could proceed to formal outpatient PT once home health therapy ends if needed.  Could proceed to injection as well.  She will let me know what she needs.  Recheck back with me as needed.Marland Kitchen  PDMP not reviewed this encounter. Orders Placed This Encounter  Procedures   [place] LIMITED JOINT SPACE STRUCTURES LOW RIGHT(NO LINKED CHARGES)    Order Specific Question:   Reason for Exam (SYMPTOM  OR DIAGNOSIS REQUIRED)    Answer:   R knee pain    Order Specific Question:   Preferred imaging location?    Answer:   [facility]   DG Knee AP/LAT W/Sun[place] Right    Standing Status:   Future    Number of Occurrences:   1    Standing Expiration Date:   06/15/2022    Order Specific Question:   Reason for Exam (SYMPTOM  OR DIAGNOSIS REQUIRED)    Answer:   R knee pain    Order Specific Question:   Is patient pregnant?    Answer:   No    Order Specific Question:   Preferred imaging location?    Answer:   [facility]   No orders of the defined types were placed in this encounter.   Discussed warning signs or symptoms. Please see discharge instructions. Patient expresses understanding.   The above documentation has been  reviewed and is accurate and complete Lynne Leader, M.D.

## 2022-05-16 ENCOUNTER — Ambulatory Visit (INDEPENDENT_AMBULATORY_CARE_PROVIDER_SITE_OTHER): Payer: Medicare HMO

## 2022-05-16 ENCOUNTER — Encounter: Payer: Self-pay | Admitting: Family Medicine

## 2022-05-16 ENCOUNTER — Ambulatory Visit (INDEPENDENT_AMBULATORY_CARE_PROVIDER_SITE_OTHER): Payer: Medicare HMO | Admitting: Family Medicine

## 2022-05-16 ENCOUNTER — Ambulatory Visit: Payer: Self-pay

## 2022-05-16 VITALS — BP 112/80 | HR 77 | Ht 63.0 in | Wt 149.6 lb

## 2022-05-16 DIAGNOSIS — M25561 Pain in right knee: Secondary | ICD-10-CM

## 2022-05-16 LAB — H. PYLORI BREATH TEST: H. pylori Breath Test: NOT DETECTED

## 2022-05-16 NOTE — Patient Instructions (Addendum)
Nice to meet you today.  Keep working w/ home health PT.  If you need an outpatient PT order in the future, let me know.  Follow-up: as needed

## 2022-05-17 ENCOUNTER — Encounter: Payer: Self-pay | Admitting: Internal Medicine

## 2022-05-17 NOTE — Progress Notes (Signed)
Right knee x-ray shows some arthritis especially on the inside part of the knee.  Otherwise looks okay to radiology.

## 2022-05-18 ENCOUNTER — Encounter: Payer: Self-pay | Admitting: Internal Medicine

## 2022-05-18 ENCOUNTER — Telehealth: Payer: Self-pay | Admitting: *Deleted

## 2022-05-18 ENCOUNTER — Ambulatory Visit (INDEPENDENT_AMBULATORY_CARE_PROVIDER_SITE_OTHER): Payer: Medicare HMO | Admitting: Internal Medicine

## 2022-05-18 VITALS — BP 136/80 | HR 96 | Temp 99.2°F | Ht 63.0 in | Wt 150.0 lb

## 2022-05-18 DIAGNOSIS — Z0001 Encounter for general adult medical examination with abnormal findings: Secondary | ICD-10-CM | POA: Diagnosis not present

## 2022-05-18 DIAGNOSIS — F5101 Primary insomnia: Secondary | ICD-10-CM

## 2022-05-18 DIAGNOSIS — R7303 Prediabetes: Secondary | ICD-10-CM | POA: Diagnosis not present

## 2022-05-18 DIAGNOSIS — E538 Deficiency of other specified B group vitamins: Secondary | ICD-10-CM

## 2022-05-18 DIAGNOSIS — I10 Essential (primary) hypertension: Secondary | ICD-10-CM | POA: Diagnosis not present

## 2022-05-18 DIAGNOSIS — L309 Dermatitis, unspecified: Secondary | ICD-10-CM | POA: Diagnosis not present

## 2022-05-18 DIAGNOSIS — H1010 Acute atopic conjunctivitis, unspecified eye: Secondary | ICD-10-CM | POA: Diagnosis not present

## 2022-05-18 DIAGNOSIS — R7989 Other specified abnormal findings of blood chemistry: Secondary | ICD-10-CM

## 2022-05-18 DIAGNOSIS — E559 Vitamin D deficiency, unspecified: Secondary | ICD-10-CM

## 2022-05-18 DIAGNOSIS — E7800 Pure hypercholesterolemia, unspecified: Secondary | ICD-10-CM

## 2022-05-18 DIAGNOSIS — G4700 Insomnia, unspecified: Secondary | ICD-10-CM | POA: Insufficient documentation

## 2022-05-18 HISTORY — DX: Dermatitis, unspecified: L30.9

## 2022-05-18 LAB — HEPATIC FUNCTION PANEL
ALT: 21 U/L (ref 0–35)
AST: 15 U/L (ref 0–37)
Albumin: 4.2 g/dL (ref 3.5–5.2)
Alkaline Phosphatase: 76 U/L (ref 39–117)
Bilirubin, Direct: 0 mg/dL (ref 0.0–0.3)
Total Bilirubin: 0.2 mg/dL (ref 0.2–1.2)
Total Protein: 7 g/dL (ref 6.0–8.3)

## 2022-05-18 LAB — URINALYSIS, ROUTINE W REFLEX MICROSCOPIC
Bilirubin Urine: NEGATIVE
Hgb urine dipstick: NEGATIVE
Ketones, ur: NEGATIVE
Leukocytes,Ua: NEGATIVE
Nitrite: NEGATIVE
RBC / HPF: NONE SEEN (ref 0–?)
Specific Gravity, Urine: 1.015 (ref 1.000–1.030)
Total Protein, Urine: NEGATIVE
Urine Glucose: NEGATIVE
Urobilinogen, UA: 0.2 (ref 0.0–1.0)
pH: 6 (ref 5.0–8.0)

## 2022-05-18 LAB — CBC WITH DIFFERENTIAL/PLATELET
Basophils Absolute: 0 10*3/uL (ref 0.0–0.1)
Basophils Relative: 0.2 % (ref 0.0–3.0)
Eosinophils Absolute: 0 10*3/uL (ref 0.0–0.7)
Eosinophils Relative: 0 % (ref 0.0–5.0)
HCT: 33.6 % — ABNORMAL LOW (ref 36.0–46.0)
Hemoglobin: 10.5 g/dL — ABNORMAL LOW (ref 12.0–15.0)
Lymphocytes Relative: 5.9 % — ABNORMAL LOW (ref 12.0–46.0)
Lymphs Abs: 1.1 10*3/uL (ref 0.7–4.0)
MCHC: 31.2 g/dL (ref 30.0–36.0)
MCV: 84.6 fl (ref 78.0–100.0)
Monocytes Absolute: 0.5 10*3/uL (ref 0.1–1.0)
Monocytes Relative: 2.7 % — ABNORMAL LOW (ref 3.0–12.0)
Neutro Abs: 16.5 10*3/uL — ABNORMAL HIGH (ref 1.4–7.7)
Neutrophils Relative %: 91.2 % — ABNORMAL HIGH (ref 43.0–77.0)
Platelets: 471 10*3/uL — ABNORMAL HIGH (ref 150.0–400.0)
RBC: 3.98 Mil/uL (ref 3.87–5.11)
RDW: 16.3 % — ABNORMAL HIGH (ref 11.5–15.5)
WBC: 18.1 10*3/uL (ref 4.0–10.5)

## 2022-05-18 LAB — BASIC METABOLIC PANEL
BUN: 21 mg/dL (ref 6–23)
CO2: 31 mEq/L (ref 19–32)
Calcium: 9.7 mg/dL (ref 8.4–10.5)
Chloride: 101 mEq/L (ref 96–112)
Creatinine, Ser: 1.1 mg/dL (ref 0.40–1.20)
GFR: 55.04 mL/min — ABNORMAL LOW (ref >60.00)
Glucose, Bld: 125 mg/dL — ABNORMAL HIGH (ref 70–99)
Potassium: 4.1 mEq/L (ref 3.5–5.1)
Sodium: 138 mEq/L (ref 135–145)

## 2022-05-18 LAB — LIPID PANEL
Cholesterol: 186 mg/dL (ref 0–200)
HDL: 96 mg/dL (ref >39.00)
LDL Cholesterol: 79 mg/dL (ref 0–99)
NonHDL: 90.43
Total CHOL/HDL Ratio: 2
Triglycerides: 55 mg/dL (ref 0.0–149.0)
VLDL: 11 mg/dL (ref 0.0–40.0)

## 2022-05-18 LAB — VITAMIN D 25 HYDROXY (VIT D DEFICIENCY, FRACTURES): VITD: 23.94 ng/mL — ABNORMAL LOW (ref 30.00–100.00)

## 2022-05-18 LAB — VITAMIN B12: Vitamin B-12: 369 pg/mL (ref 211–911)

## 2022-05-18 LAB — TSH: TSH: 0.2 u[IU]/mL — ABNORMAL LOW (ref 0.35–5.50)

## 2022-05-18 LAB — HEMOGLOBIN A1C: Hgb A1c MFr Bld: 6.3 % (ref 4.6–6.5)

## 2022-05-18 MED ORDER — TRIAMCINOLONE ACETONIDE 0.1 % EX OINT: 1.0000 "application " | TOPICAL_OINTMENT | Freq: Two times a day (BID) | CUTANEOUS | 1 refills | Status: DC

## 2022-05-18 MED ORDER — TRAZODONE HCL 100 MG PO TABS: 100.0000 mg | ORAL_TABLET | Freq: Every evening | ORAL | 1 refills | Status: DC | PRN

## 2022-05-18 MED ORDER — AZELASTINE HCL 0.05 % OP SOLN: 1.0000 [drp] | Freq: Two times a day (BID) | OPHTHALMIC | 12 refills | Status: AC

## 2022-05-18 NOTE — Patient Instructions (Signed)
Ok to stop the [employer] as you have  Please take all new medication as prescribed- the trazodone for sleep, and the optivar for the eyes  Please continue all other medications as before, and refills have been done if requested - the triamcinolone cream  Please have the pharmacy call with any other refills you may need.  Please continue your efforts at being more active, low cholesterol diet, and weight control.  You are otherwise up to date with prevention measures today.  Please keep your appointments with your specialists as you may have planned  Please go to the LAB at the blood drawing area for the tests to be done  You will be contacted by phone if any changes need to be made immediately.  Otherwise, you will receive a letter about your results with an explanation, but please check with MyChart first.  Please remember to sign up for MyChart if you have not done so, as this will be important to you in the future with finding out test results, communicating by private email, and scheduling acute appointments online when needed.  Please make an Appointment to return in 6 months, or sooner if needed

## 2022-05-18 NOTE — Telephone Encounter (Signed)
This is recently chronic persistent I suspect possilby related to recent steroid treatment, ok to follow

## 2022-05-18 NOTE — Telephone Encounter (Signed)
CRITICAL VALUE STICKER  CRITICAL VALUE: WBC 18.1   RECEIVER (on-site recipient of call):[place]ia [place] NOTIFIED: 05/18/2022 @ 4:48pm  MESSENGER (representative from lab): [facility]    MD NOTIFIED: Yes   TIME OF NOTIFICATION:4:48pm  RESPONSE:  awaiting response

## 2022-05-20 ENCOUNTER — Encounter: Payer: Self-pay | Admitting: Internal Medicine

## 2022-05-20 DIAGNOSIS — E559 Vitamin D deficiency, unspecified: Secondary | ICD-10-CM | POA: Insufficient documentation

## 2022-05-20 DIAGNOSIS — R7989 Other specified abnormal findings of blood chemistry: Secondary | ICD-10-CM | POA: Insufficient documentation

## 2022-05-20 NOTE — Assessment & Plan Note (Signed)
Lab Results  Component Value Date   LDLCALC 79 05/18/2022   Uncontrolled, goal ldl < 70, , pt to continue current statin lipitor 40 as declines change, also for lower chol diet

## 2022-05-20 NOTE — Assessment & Plan Note (Signed)
Mild uncontrolled, for restart triam cr prn asd

## 2022-05-20 NOTE — Progress Notes (Signed)
Patient ID: [name], female   DOB: 28-Jul-1961, 61 y.o.   MRN: [id number]         Chief Complaint:: wellness exam and Office Visit (Swelling of left eye)  , eczema, insomnia and hallucination, hld       HPI:  [name] is a 61 y.o. female here for wellness exam; declines shingrix and covid booster, o/w up to date                        Also unfortunately had hallucinations with the belsomra, so no longer wants to take.  Still needs med for insomnia persistent moderate for > 3 months.  Denies worsening depressive symptoms, suicidal ideation, or panic; has ongoing anxiety.  Pt denies chest pain, increased sob or doe, wheezing, orthopnea, PND, increased LE swelling, palpitations, dizziness or syncope.   Pt denies polydipsia, polyuria, or new focal neuro s/s.    Pt denies fever, wt loss, night sweats, loss of appetite, or other constitutional symptoms  Does also have worsening allergy symptons in particular left lower eyelid swelling and eye slight d/c, as well as eczema flare again in last 2 wks with itching all over worse to the legs.     Wt Readings from Last 3 Encounters:  05/18/22 150 lb (68 kg)  05/16/22 149 lb 9.6 oz (67.9 kg)  05/10/22 148 lb 12.8 oz (67.5 kg)   BP Readings from Last 3 Encounters:  05/18/22 136/80  05/16/22 112/80  05/10/22 132/80   Immunization History  Administered Date(s) Administered   Influenza Split 10/23/2011   Influenza Whole 09/16/2012   Influenza,inj,Quad PF,6+ Mos 10/18/2014, 08/31/2015, 09/25/2016, 10/09/2017, 09/23/2018, 08/12/2019, 10/06/2020, 11/24/2021   PFIZER(Purple Top)SARS-COV-2 Vaccination 02/24/2020, 03/16/2020, 10/29/2020   PNEUMOCOCCAL CONJUGATE-20 06/15/2021   Pneumococcal Polysaccharide-23 12/02/2015   Td 04/22/2014  There are no preventive care reminders to display for this patient.    Past Medical History:  Diagnosis Date   Anemia, iron deficiency 12/22/2014   pt. denies   Anxiety    Arthritis     ASTHMA 05/12/2009   Severe AFL (Spirometry 05/2009: pre-BD FEV1 0.87L 34% pred, post-BD FEV1 1.11L 44% pred) Volumes hyperinflated Decreased DLCO that does not fully correct to normal range for alveolar volume.      Bipolar disorder ([place])    COPD (chronic obstructive pulmonary disease) ([place])    Depression    Eczema 05/18/2022   Fibromyalgia 05/14/2014   GERD (gastroesophageal reflux disease)    History of kidney stones    Hyperlipidemia 04/20/2017   HYPERTENSION 05/12/2009   Qualifier: Diagnosis of  By: Lenn Cal Deborra Medina), [name]     Kidney stones    Peripheral vascular disease ([place])    Pneumonia    Prediabetes 02/23/2014   pt. denies   Seizure ([place])    Stroke ([place]) 11/2020   Urticaria    Past Surgical History:  Procedure Laterality Date   ABDOMINAL HYSTERECTOMY     ANTERIOR CERVICAL DECOMP/DISCECTOMY FUSION N/A 07/28/2020   Procedure: ANTERIOR CERVICAL DECOMPRESSION/DISCECTOMY FUSION. INTERBODY PROTHESIS, PLATE/SCREWS CERVICAL THREE- CERVICAL FOUR, CERVICAL FOUR- CERVICAL FIVE;  Surgeon: Newman Pies, MD;  Location: [facility];  Service: Neurosurgery;  Laterality: N/A;   BACK SURGERY     COLONOSCOPY  12/20/2011   Procedure: COLONOSCOPY;  Surgeon: Landry Dyke, MD;  Location: [facility];  Service: Endoscopy;  Laterality: N/A;   COLONOSCOPY  03/05/2012   Procedure: COLONOSCOPY;  Surgeon: Landry Dyke, MD;  Location: [facility];  Service:  Endoscopy;  Laterality: N/A;   DIAGNOSTIC LAPAROSCOPY     HEMORRHOID SURGERY     INCISE AND DRAIN ABCESS     KIDNEY STONE SURGERY     NECK SURGERY     x 2 Dr Orinda Kenner   SPINE SURGERY  2019   TOE SURGERY     TUBAL LIGATION      reports that she quit smoking about 3 years ago. Her smoking use included cigarettes. She has a 9.00 pack-year smoking history. She has never used smokeless tobacco. She reports that she does not currently use alcohol. She reports current drug use. Drug: Marijuana. family history includes Alcohol abuse in her brother  and father; Anemia in her daughter, sister, and sister; Anesthesia problems in her father; Asthma in her brother, mother, and sister; Brain cancer in her sister; COPD in her mother and sister; Colon cancer (age of onset: 62) in her father; Diabetes in her brother, father, mother, sister, sister, sister, and sister; Heart disease in her brother, brother, mother, and sister; Hypertension in her brother, daughter, sister, sister, sister, sister, and sister; Kidney disease in her father; Sleep apnea in her brother. Allergies  Allergen Reactions   Sulfa Antibiotics Nausea And Vomiting   Belsomra [Suvorexant] Other (See Comments)    hallucinations   Venofer [Iron Sucrose] Hives   Current Outpatient Medications on File Prior to Visit  Medication Sig Dispense Refill   acetaminophen (TYLENOL) 325 MG tablet Take 2 tablets (650 mg total) by mouth every 6 (six) hours as needed for mild pain (or Fever >/= 101).     albuterol (PROVENTIL) (2.5 MG/3ML) 0.083% nebulizer solution Take 3 mLs (2.5 mg total) by nebulization every 6 (six) hours as needed for wheezing or shortness of breath. Reported on 06/04/2016 120 mL 11   albuterol (VENTOLIN HFA) 108 (90 Base) MCG/ACT inhaler Inhale 2 puffs into the lungs every 4 (four) hours as needed for wheezing or shortness of breath. 18 g 11   atorvastatin (LIPITOR) 40 MG tablet TAKE 1 TABLET(40 MG) BY MOUTH DAILY (Patient taking differently: Take 40 mg by mouth daily.) 90 tablet 1   Biotin 1000 MCG tablet Take 1,000 mcg by mouth daily.     Budeson-Glycopyrrol-Formoterol (BREZTRI AEROSPHERE) 160-9-4.8 MCG/ACT AERO INHALE 2 PUFFS INTO THE LUNGS TWICE DAILY WITH SPACER (Patient taking differently: Inhale 2 puffs into the lungs 2 (two) times daily. WITH SPACER) 10.7 g 5   carbamazepine (TEGRETOL) 200 MG tablet Take 2 tablets (400 mg total) by mouth 2 (two) times daily. 120 tablet 12   cetirizine (ZYRTEC) 10 MG tablet Can take one tablet by mouth once daily if needed for runny nose.  (Patient taking differently: Take 10 mg by mouth daily as needed for rhinitis.) 30 tablet 5   cyclobenzaprine (FLEXERIL) 5 MG tablet TAKE 1 TABLET(5 MG) BY MOUTH THREE TIMES DAILY AS NEEDED FOR MUSCLE SPASMS (Patient taking differently: Take 5 mg by mouth 3 (three) times daily as needed for muscle spasms.) 60 tablet 5   diclofenac Sodium (VOLTAREN) 1 % GEL Apply 4 g topically 4 (four) times daily. (Patient taking differently: Apply 4 g topically 4 (four) times daily as needed (pain).) 100 g 0   DUPIXENT 300 MG/2ML prefilled syringe Inject 300 mg into the skin every 28 (twenty-eight) days.     estradiol (ESTRACE) 0.5 MG tablet Take 1 tablet (0.5 mg total) by mouth daily. 90 tablet 4   famotidine (PEPCID) 20 MG tablet Take 20 mg by mouth daily as needed  for heartburn or indigestion. OTC     fluticasone (FLONASE) 50 MCG/ACT nasal spray Place 1 spray into both nostrils 2 (two) times daily as needed for allergies or rhinitis. 16 g 5   gabapentin (NEURONTIN) 100 MG capsule Take 1 capsule (100 mg total) by mouth 3 (three) times daily. 90 capsule 3   hydrochlorothiazide (MICROZIDE) 12.5 MG capsule TAKE 1 CAPSULE(12.5 MG) BY MOUTH DAILY (Patient taking differently: Take 12.5 mg by mouth daily.) 90 capsule 1   hydrOXYzine (VISTARIL) 25 MG capsule Take 1 when angry and if not calmer in 1 hour repeat (Patient taking differently: Take 25 mg by mouth See admin instructions. Take 1 capsule when becoming angry and repeat in 1 hour if not calmer.) 45 capsule 6   ipratropium (ATROVENT HFA) 17 MCG/ACT inhaler Inhale 2 puffs into the lungs every 6 (six) hours as needed for wheezing. 1 each 12   irbesartan (AVAPRO) 75 MG tablet TAKE 1 TABLET(75 MG) BY MOUTH DAILY (Patient taking differently: Take 75 mg by mouth daily.) 90 tablet 1   levocetirizine (XYZAL) 5 MG tablet Take 1 tablet (5 mg total) by mouth every evening. 30 tablet 5   montelukast (SINGULAIR) 10 MG tablet TAKE 1 TABLET(10 MG) BY MOUTH AT BEDTIME (Patient taking  differently: Take 10 mg by mouth at bedtime.) 30 tablet 0   ondansetron (ZOFRAN ODT) 4 MG disintegrating tablet Take 1 tablet (4 mg total) by mouth every 8 (eight) hours as needed for nausea or vomiting. 20 tablet 0   pantoprazole (PROTONIX) 40 MG tablet TAKE 1 TABLET BY MOUTH EVERY DAY (Patient taking differently: Take 40 mg by mouth daily.) 90 tablet 1   potassium chloride (KLOR-CON) 10 MEQ tablet Take 1 tablet (10 mEq total) by mouth daily. 90 tablet 3   predniSONE (DELTASONE) 10 MG tablet Take 1 tablet (10 mg total) by mouth daily with breakfast. (Patient taking differently: Take 10 mg by mouth See admin instructions. Qd x 30 days) 30 tablet 0   UNKNOWN TO PATIENT Weekly allergy shots     Current Facility-Administered Medications on File Prior to Visit  Medication Dose Route Frequency Provider Last Rate Last Admin   dupilumab (DUPIXENT) prefilled syringe 300 mg  300 mg Subcutaneous Q14 Days Valentina Shaggy, MD   300 mg at 05/09/22 1337        ROS:  All others reviewed and negative.  Objective        PE:  BP 136/80 (BP Location: Right Arm, Patient Position: Sitting, Cuff Size: Large)   Pulse 96   Temp 99.2 F (37.3 C) (Oral)   Ht '5\' 3"'$  (1.6 m)   Wt 150 lb (68 kg)   SpO2 96%   BMI 26.57 kg/m                 Constitutional: Pt appears in NAD               HENT: Head: NCAT.                Right Ear: External ear normal.                 Left Ear: External ear normal.                Eyes: . Pupils are equal, round, and reactive to light. Conjunctivae and EOM are normal except for left conjunctiva mild erythema and left lower eyelid nontender puffy  Nose: without d/c or deformity               Neck: Neck supple. Gross normal ROM               Cardiovascular: Normal rate and regular rhythm.                 Pulmonary/Chest: Effort normal and breath sounds without rales or wheezing.                Abd:  Soft, NT, ND, + BS, no organomegaly               Neurological: Pt  is alert. At baseline orientation, motor grossly intact               Skin: Skin is warm.+ eczema to legs,, LE edema - none               Psychiatric: Pt behavior is normal without agitation   Micro: none  Cardiac tracings I have personally interpreted today:  none  Pertinent Radiological findings (summarize): none   Lab Results  Component Value Date   WBC 18.1 Repeated and verified X2. (HH) 05/18/2022   HGB 10.5 (L) 05/18/2022   HCT 33.6 (L) 05/18/2022   PLT 471.0 (H) 05/18/2022   GLUCOSE 125 (H) 05/18/2022   CHOL 186 05/18/2022   TRIG 55.0 05/18/2022   HDL 96.00 05/18/2022   LDLCALC 79 05/18/2022   ALT 21 05/18/2022   AST 15 05/18/2022   NA 138 05/18/2022   K 4.1 05/18/2022   CL 101 05/18/2022   CREATININE 1.10 05/18/2022   BUN 21 05/18/2022   CO2 31 05/18/2022   TSH 0.20 (L) 05/18/2022   INR 0.9 02/06/2022   HGBA1C 6.3 05/18/2022   MICROALBUR 0.9 04/11/2021   Assessment/Plan:  [name] is a 61 y.o. Black or African American [2] female with  has a past medical history of Anemia, iron deficiency (12/22/2014), Anxiety, Arthritis, ASTHMA (05/12/2009), Bipolar disorder ([place]), COPD (chronic obstructive pulmonary disease) ([place]), Depression, Eczema (05/18/2022), Fibromyalgia (05/14/2014), GERD (gastroesophageal reflux disease), History of kidney stones, Hyperlipidemia (04/20/2017), HYPERTENSION (05/12/2009), Kidney stones, Peripheral vascular disease ([place]), Pneumonia, Prediabetes (02/23/2014), Seizure ([place]), Stroke ([place]) (11/2020), and Urticaria.  Encounter for well adult exam with abnormal findings Age and sex appropriate education and counseling updated with regular exercise and diet Referrals for preventative services - none needed Immunizations addressed - decliens shingirx, covid booster Smoking counseling  - none needed Evidence for depression or other mood disorder - none significant Most recent labs reviewed. I have personally reviewed and have  noted: 1) the patient's medical and social history 2) The patient's current medications and supplements 3) The patient's height, weight, and BMI have been recorded in the chart   Prediabetes Lab Results  Component Value Date   HGBA1C 6.3 05/18/2022   Stable, pt to continue current medical treatment  - diet, wt control, excercise   Insomnia Moderate persistent, for trazodone 50 mg qhs prn,  to f/u any worsening symptoms or concerns  Hyperlipidemia Lab Results  Component Value Date   LDLCALC 79 05/18/2022   Uncontrolled, goal ldl < 70, , pt to continue current statin lipitor 40 as declines change, also for lower chol diet   Essential (primary) hypertension BP Readings from Last 3 Encounters:  05/18/22 136/80  05/16/22 112/80  05/10/22 132/80   Stable, pt to continue medical treatment hct 12.5 qd, avapro 75 qd   Eczema Mild uncontrolled, for restart  triam cr prn asd  Allergic conjunctivitis With left lid and eye erythema likely allergic - for optivar prn asd  Vitamin D deficiency Last vitamin D Lab Results  Component Value Date   VD25OH 23.94 (L) 05/18/2022   Low, to start oral replacement  Followup: Return in about 6 months (around 11/17/2022).  Cathlean Cower, MD 05/20/2022 4:17 PM [facility]

## 2022-05-20 NOTE — Assessment & Plan Note (Signed)
Last vitamin D Lab Results  Component Value Date   VD25OH 23.94 (L) 05/18/2022   Low, to start oral replacement

## 2022-05-20 NOTE — Assessment & Plan Note (Signed)
Age and sex appropriate education and counseling updated with regular exercise and diet Referrals for preventative services - none needed Immunizations addressed - decliens shingirx, covid booster Smoking counseling  - none needed Evidence for depression or other mood disorder - none significant Most recent labs reviewed. I have personally reviewed and have noted: 1) the patient's medical and social history 2) The patient's current medications and supplements 3) The patient's height, weight, and BMI have been recorded in the chart

## 2022-05-20 NOTE — Assessment & Plan Note (Signed)
Moderate persistent, for trazodone 50 mg qhs prn,  to f/u any worsening symptoms or concerns

## 2022-05-20 NOTE — Assessment & Plan Note (Signed)
With left lid and eye erythema likely allergic - for optivar prn asd

## 2022-05-20 NOTE — Assessment & Plan Note (Signed)
Lab Results  Component Value Date   HGBA1C 6.3 05/18/2022   Stable, pt to continue current medical treatment  - diet, wt control, excercise  

## 2022-05-20 NOTE — Assessment & Plan Note (Signed)
BP Readings from Last 3 Encounters:  05/18/22 136/80  05/16/22 112/80  05/10/22 132/80   Stable, pt to continue medical treatment hct 12.5 qd, avapro 75 qd

## 2022-05-23 ENCOUNTER — Ambulatory Visit (INDEPENDENT_AMBULATORY_CARE_PROVIDER_SITE_OTHER): Payer: Medicare HMO

## 2022-05-23 ENCOUNTER — Telehealth: Payer: Self-pay | Admitting: Internal Medicine

## 2022-05-23 ENCOUNTER — Ambulatory Visit: Payer: Self-pay

## 2022-05-23 DIAGNOSIS — J4550 Severe persistent asthma, uncomplicated: Secondary | ICD-10-CM

## 2022-05-23 MED ORDER — HYDROCHLOROTHIAZIDE 12.5 MG PO CAPS: 12.5000 mg | ORAL_CAPSULE | Freq: Every day | ORAL | 3 refills | Status: DC

## 2022-05-23 MED ORDER — POTASSIUM CHLORIDE CRYS ER 10 MEQ PO TBCR
10.0000 meq | EXTENDED_RELEASE_TABLET | Freq: Every day | ORAL | 3 refills | Status: DC
Start: 2022-05-23 — End: 2023-04-15

## 2022-05-23 MED ORDER — ATORVASTATIN CALCIUM 40 MG PO TABS: 40.0000 mg | ORAL_TABLET | Freq: Every day | ORAL | 3 refills | Status: DC

## 2022-05-23 MED ORDER — CYCLOBENZAPRINE HCL 5 MG PO TABS: ORAL_TABLET | ORAL | 5 refills | Status: DC

## 2022-05-23 NOTE — Telephone Encounter (Signed)
[facility] home health nurse states patient is out of the following meds  Hydrochlorothizide Potassium Atorvastatin Flexeril  Patient uses [facility] at [facility]

## 2022-05-24 ENCOUNTER — Telehealth: Payer: Self-pay | Admitting: Emergency Medicine

## 2022-05-24 MED ORDER — BREZTRI AEROSPHERE 160-9-4.8 MCG/ACT IN AERO
2.0000 | INHALATION_SPRAY | Freq: Two times a day (BID) | RESPIRATORY_TRACT | 6 refills | Status: DC
Start: 2022-05-24 — End: 2022-10-19

## 2022-05-24 NOTE — Telephone Encounter (Signed)
I called the patient and made a follow up for this patient and she is aware to come in for a follow up per Dr. Lamonte Sakai and they will discuss further.

## 2022-05-24 NOTE — Telephone Encounter (Signed)
Agree with staying on breztri Need to see her in the office to determine next steps to get her vent support at night

## 2022-05-24 NOTE — Telephone Encounter (Signed)
Called and spoke with patient, she states she only has 10 puffs left on her Breztri inhaler and is requesting a sample.  She said it is either too early to refill or insurance is not covering right now.  I looked at her prescriptions and told her it looked like she had not had a prescription sent in since October of 2022 and that was by her pcp.  Advised I would send a script in to her pharmacy.  Verified her pharmacy.  She also was wanting to discuss the Trilogy vent, she was on it at the hospital and it really helped her.  She said she wakes up at night sob and jumps up.  She states she has used a CPAP and BIPAP in the hospital in the past as well.  I let her know I would get a message to Dr. Lamonte Sakai and once we hear back from him we will call her back regarding the Trilogy vent.  She verbalized understanding.  Dr. Lamonte Sakai, Please advise regarding Trilogy vent, patient is inquiring about using one at home since it has helped her in the past in the hospital.   Thank you.

## 2022-05-27 ENCOUNTER — Inpatient Hospital Stay (HOSPITAL_COMMUNITY)
Admission: EM | Admit: 2022-05-27 | Discharge: 2022-06-01 | DRG: 189 | Disposition: A | Discharge status: Home / Self Care | Payer: Medicare HMO | Attending: Internal Medicine | Admitting: Internal Medicine

## 2022-05-27 DIAGNOSIS — Z90710 Acquired absence of both cervix and uterus: Secondary | ICD-10-CM

## 2022-05-27 DIAGNOSIS — Z9851 Tubal ligation status: Secondary | ICD-10-CM

## 2022-05-27 DIAGNOSIS — R7303 Prediabetes: Secondary | ICD-10-CM | POA: Diagnosis present

## 2022-05-27 DIAGNOSIS — J9601 Acute respiratory failure with hypoxia: Secondary | ICD-10-CM | POA: Diagnosis present

## 2022-05-27 DIAGNOSIS — R0602 Shortness of breath: Secondary | ICD-10-CM | POA: Diagnosis not present

## 2022-05-27 DIAGNOSIS — M797 Fibromyalgia: Secondary | ICD-10-CM | POA: Diagnosis present

## 2022-05-27 DIAGNOSIS — Z8701 Personal history of pneumonia (recurrent): Secondary | ICD-10-CM | POA: Diagnosis not present

## 2022-05-27 DIAGNOSIS — Z881 Allergy status to other antibiotic agents status: Secondary | ICD-10-CM

## 2022-05-27 DIAGNOSIS — R079 Chest pain, unspecified: Secondary | ICD-10-CM | POA: Diagnosis present

## 2022-05-27 DIAGNOSIS — J9602 Acute respiratory failure with hypercapnia: Secondary | ICD-10-CM | POA: Diagnosis present

## 2022-05-27 DIAGNOSIS — G40909 Epilepsy, unspecified, not intractable, without status epilepticus: Secondary | ICD-10-CM | POA: Diagnosis present

## 2022-05-27 DIAGNOSIS — K5900 Constipation, unspecified: Secondary | ICD-10-CM | POA: Diagnosis not present

## 2022-05-27 DIAGNOSIS — Z77098 Contact with and (suspected) exposure to other hazardous, chiefly nonmedicinal, chemicals: Secondary | ICD-10-CM | POA: Diagnosis present

## 2022-05-27 DIAGNOSIS — D649 Anemia, unspecified: Secondary | ICD-10-CM | POA: Diagnosis present

## 2022-05-27 DIAGNOSIS — T380X5A Adverse effect of glucocorticoids and synthetic analogues, initial encounter: Secondary | ICD-10-CM | POA: Diagnosis present

## 2022-05-27 DIAGNOSIS — E559 Vitamin D deficiency, unspecified: Secondary | ICD-10-CM | POA: Diagnosis present

## 2022-05-27 DIAGNOSIS — Z87442 Personal history of urinary calculi: Secondary | ICD-10-CM | POA: Diagnosis not present

## 2022-05-27 DIAGNOSIS — J441 Chronic obstructive pulmonary disease with (acute) exacerbation: Secondary | ICD-10-CM | POA: Diagnosis present

## 2022-05-27 DIAGNOSIS — M1990 Unspecified osteoarthritis, unspecified site: Secondary | ICD-10-CM | POA: Diagnosis present

## 2022-05-27 DIAGNOSIS — Z8673 Personal history of transient ischemic attack (TIA), and cerebral infarction without residual deficits: Secondary | ICD-10-CM

## 2022-05-27 DIAGNOSIS — E785 Hyperlipidemia, unspecified: Secondary | ICD-10-CM | POA: Diagnosis present

## 2022-05-27 DIAGNOSIS — J9611 Chronic respiratory failure with hypoxia: Secondary | ICD-10-CM | POA: Diagnosis present

## 2022-05-27 DIAGNOSIS — Z981 Arthrodesis status: Secondary | ICD-10-CM | POA: Diagnosis not present

## 2022-05-27 DIAGNOSIS — K219 Gastro-esophageal reflux disease without esophagitis: Secondary | ICD-10-CM | POA: Diagnosis present

## 2022-05-27 DIAGNOSIS — D72829 Elevated white blood cell count, unspecified: Secondary | ICD-10-CM | POA: Diagnosis present

## 2022-05-27 DIAGNOSIS — I10 Essential (primary) hypertension: Secondary | ICD-10-CM | POA: Diagnosis present

## 2022-05-27 DIAGNOSIS — J45901 Unspecified asthma with (acute) exacerbation: Secondary | ICD-10-CM | POA: Diagnosis present

## 2022-05-27 DIAGNOSIS — Z79890 Hormone replacement therapy (postmenopausal): Secondary | ICD-10-CM

## 2022-05-27 DIAGNOSIS — Z888 Allergy status to other drugs, medicaments and biological substances status: Secondary | ICD-10-CM

## 2022-05-27 DIAGNOSIS — Z79899 Other long term (current) drug therapy: Secondary | ICD-10-CM | POA: Diagnosis not present

## 2022-05-27 DIAGNOSIS — Z825 Family history of asthma and other chronic lower respiratory diseases: Secondary | ICD-10-CM

## 2022-05-27 DIAGNOSIS — I739 Peripheral vascular disease, unspecified: Secondary | ICD-10-CM | POA: Diagnosis present

## 2022-05-27 DIAGNOSIS — Z8249 Family history of ischemic heart disease and other diseases of the circulatory system: Secondary | ICD-10-CM

## 2022-05-27 NOTE — ED Triage Notes (Signed)
Patient BIB EMS for evaluation of SHOB with hx of COPD.  Reports symptoms started around 2100 tonight.  On EMS arrival patient was noted in distress.  Given DuoNeb x 2, Magnesium 2 grams IV, and Solu-Medrol with minimal improvement.  Arrived on CPAP

## 2022-05-27 NOTE — ED Provider Notes (Signed)
[place] EMERGENCY DEPARTMENT Provider Note   CSN: [id number] Arrival date & time: 05/27/22  2353     History {Add pertinent medical, surgical, social history, OB history to HPI:1} Chief Complaint  Patient presents with   Shortness of Breath    [name] is a 61 y.o. female.  The history is provided by the patient and medical records.  Shortness of Breath  60 year old female with history of hypertension, COPD, depression, asthma, fibromyalgia, GERD, vertigo, migraine headaches, presenting to the ED with shortness of breath.  EMS called to home due to shortness of breath for 6+ hours.  Upon EMS arrival she was tripoding, diaphoretic, with minimal air movement.  She was immediately started on CPAP.  Administered 2 g magnesium, 125 mg Solu-Medrol, 10 of albuterol, 1 of Atrovent with some improvement.  She does report longstanding history of COPD, has required admission with BiPAP before, no prior intubations.  Denies any recent illness, no fever, no chills, no sick contacts.  Home Medications Prior to Admission medications   Medication Sig Start Date End Date Taking? Authorizing Provider  acetaminophen (TYLENOL) 325 MG tablet Take 2 tablets (650 mg total) by mouth every 6 (six) hours as needed for mild pain (or Fever >/= 101). 02/07/22   Elgergawy, Silver Huguenin, MD  albuterol (PROVENTIL) (2.5 MG/3ML) 0.083% nebulizer solution Take 3 mLs (2.5 mg total) by nebulization every 6 (six) hours as needed for wheezing or shortness of breath. Reported on 06/04/2016 08/11/20   Lauraine Rinne, NP  albuterol (VENTOLIN HFA) 108 (90 Base) MCG/ACT inhaler Inhale 2 puffs into the lungs every 4 (four) hours as needed for wheezing or shortness of breath. 04/11/22   Biagio Borg, MD  atorvastatin (LIPITOR) 40 MG tablet Take 1 tablet (40 mg total) by mouth daily. TAKE 1 TABLET(40 MG) BY MOUTH DAILY Strength: 40 mg 05/23/22   Biagio Borg, MD  azelastine (OPTIVAR) 0.05 % ophthalmic  solution Place 1 drop into both eyes 2 (two) times daily. 05/18/22   Biagio Borg, MD  Biotin 1000 MCG tablet Take 1,000 mcg by mouth daily.    [provider]  Budeson-Glycopyrrol-Formoterol (BREZTRI AEROSPHERE) 160-9-4.8 MCG/ACT AERO INHALE 2 PUFFS INTO THE LUNGS TWICE DAILY WITH SPACER Patient taking differently: Inhale 2 puffs into the lungs 2 (two) times daily. WITH SPACER 10/11/21   Biagio Borg, MD  Budeson-Glycopyrrol-Formoterol (BREZTRI AEROSPHERE) 160-9-4.8 MCG/ACT AERO Inhale 2 puffs into the lungs in the morning and at bedtime. 05/24/22   Collene Gobble, MD  carbamazepine (TEGRETOL) 200 MG tablet Take 2 tablets (400 mg total) by mouth 2 (two) times daily. 03/23/22   Plovsky, Berneta Sages, MD  cetirizine (ZYRTEC) 10 MG tablet Can take one tablet by mouth once daily if needed for runny nose. Patient taking differently: Take 10 mg by mouth daily as needed for rhinitis. 04/28/21   Dara Hoyer, FNP  cyclobenzaprine (FLEXERIL) 5 MG tablet TAKE 1 TABLET(5 MG) BY MOUTH THREE TIMES DAILY AS NEEDED FOR MUSCLE SPASMS 05/23/22   Biagio Borg, MD  diclofenac Sodium (VOLTAREN) 1 % GEL Apply 4 g topically 4 (four) times daily. Patient taking differently: Apply 4 g topically 4 (four) times daily as needed (pain). 06/04/20   Deno Etienne, DO  DUPIXENT 300 MG/2ML prefilled syringe Inject 300 mg into the skin every 28 (twenty-eight) days. 04/18/22   [provider]  estradiol (ESTRACE) 0.5 MG tablet Take 1 tablet (0.5 mg total) by mouth daily. 09/21/21  Princess Bruins, MD  famotidine (PEPCID) 20 MG tablet Take 20 mg by mouth daily as needed for heartburn or indigestion. OTC    [provider]  fluticasone (FLONASE) 50 MCG/ACT nasal spray Place 1 spray into both nostrils 2 (two) times daily as needed for allergies or rhinitis. 09/06/21   Valentina Shaggy, MD  gabapentin (NEURONTIN) 100 MG capsule Take 1 capsule (100 mg total) by mouth 3 (three) times daily. 03/23/22   Plovsky, Berneta Sages, MD   hydrochlorothiazide (MICROZIDE) 12.5 MG capsule Take 1 capsule (12.5 mg total) by mouth daily. TAKE 1 CAPSULE(12.5 MG) BY MOUTH DAILY Strength: 12.5 mg 05/23/22   Biagio Borg, MD  hydrOXYzine (VISTARIL) 25 MG capsule Take 1 when angry and if not calmer in 1 hour repeat Patient taking differently: Take 25 mg by mouth See admin instructions. Take 1 capsule when becoming angry and repeat in 1 hour if not calmer. 03/23/22   Plovsky, Berneta Sages, MD  ipratropium (ATROVENT HFA) 17 MCG/ACT inhaler Inhale 2 puffs into the lungs every 6 (six) hours as needed for wheezing. 01/09/22   Valentina Shaggy, MD  irbesartan (AVAPRO) 75 MG tablet TAKE 1 TABLET(75 MG) BY MOUTH DAILY Patient taking differently: Take 75 mg by mouth daily. 11/06/21   Biagio Borg, MD  levocetirizine (XYZAL) 5 MG tablet Take 1 tablet (5 mg total) by mouth every evening. 09/06/21   Valentina Shaggy, MD  montelukast (SINGULAIR) 10 MG tablet TAKE 1 TABLET(10 MG) BY MOUTH AT BEDTIME Patient taking differently: Take 10 mg by mouth at bedtime. 12/06/21   Collene Gobble, MD  ondansetron (ZOFRAN ODT) 4 MG disintegrating tablet Take 1 tablet (4 mg total) by mouth every 8 (eight) hours as needed for nausea or vomiting. 05/19/21   Valentina Shaggy, MD  pantoprazole (PROTONIX) 40 MG tablet TAKE 1 TABLET BY MOUTH EVERY DAY Patient taking differently: Take 40 mg by mouth daily. 05/24/21   Noralyn Pick, NP  potassium chloride (KLOR-CON M) 10 MEQ tablet Take 1 tablet (10 mEq total) by mouth daily. 05/23/22   Biagio Borg, MD  predniSONE (DELTASONE) 10 MG tablet Take 1 tablet (10 mg total) by mouth daily with breakfast. Patient taking differently: Take 10 mg by mouth See admin instructions. Qd x 30 days 04/11/22   Biagio Borg, MD  traZODone (DESYREL) 100 MG tablet Take 1 tablet (100 mg total) by mouth at bedtime as needed for sleep. 05/18/22   Biagio Borg, MD  triamcinolone ointment (KENALOG) 0.1 % Apply 1 application. topically 2 (two)  times daily. 05/18/22   Biagio Borg, MD  UNKNOWN TO PATIENT Weekly allergy shots    [provider]      Allergies    Sulfa antibiotics, Belsomra [suvorexant], and Venofer [iron sucrose]    Review of Systems   Review of Systems  Respiratory:  Positive for shortness of breath.   All other systems reviewed and are negative.   Physical Exam Updated Vital Signs There were no vitals taken for this visit.  Physical Exam Vitals and nursing note reviewed.  Constitutional:      Appearance: She is well-developed.  HENT:     Head: Normocephalic and atraumatic.  Eyes:     Conjunctiva/sclera: Conjunctivae normal.     Pupils: Pupils are equal, round, and reactive to light.  Cardiovascular:     Rate and Rhythm: Normal rate and regular rhythm.     Heart sounds: Normal heart sounds.  Pulmonary:  Effort: Tachypnea and accessory muscle usage present.     Breath sounds: Wheezing present.     Comments: Tachypneic, accessory muscle use, faint wheezes in upper lobes, transitioned to bipap during exam Abdominal:     General: Bowel sounds are normal.     Palpations: Abdomen is soft.  Musculoskeletal:        General: Normal range of motion.     Cervical back: Normal range of motion.  Skin:    General: Skin is warm and dry.  Neurological:     Mental Status: She is alert and oriented to person, place, and time.     ED Results / Procedures / Treatments   Labs (all labs ordered are listed, but only abnormal results are displayed) Labs Reviewed  CBC WITH DIFFERENTIAL/PLATELET  BASIC METABOLIC PANEL  I-STAT ARTERIAL BLOOD GAS, ED    EKG None  Radiology No results found.  Procedures Procedures  {Document cardiac monitor, telemetry assessment procedure when appropriate:1}  Medications Ordered in ED Medications - No data to display  ED Course/ Medical Decision Making/ A&P                           Medical Decision Making Amount and/or Complexity of Data  Reviewed Labs: ordered. Radiology: ordered. ECG/medicine tests: ordered.   ***  {Document critical care time when appropriate:1} {Document review of labs and clinical decision tools ie heart score, Chads2Vasc2 etc:1}  {Document your independent review of radiology images, and any outside records:1} {Document your discussion with family members, caretakers, and with consultants:1} {Document social determinants of health affecting pt's care:1} {Document your decision making why or why not admission, treatments were needed:1} Final Clinical Impression(s) / ED Diagnoses Final diagnoses:  None    Rx / DC Orders ED Discharge Orders     None

## 2022-05-28 ENCOUNTER — Telehealth: Payer: Self-pay | Admitting: Emergency Medicine

## 2022-05-28 ENCOUNTER — Emergency Department (HOSPITAL_COMMUNITY): Payer: Medicare HMO

## 2022-05-28 ENCOUNTER — Other Ambulatory Visit: Payer: Self-pay

## 2022-05-28 DIAGNOSIS — M797 Fibromyalgia: Secondary | ICD-10-CM | POA: Diagnosis present

## 2022-05-28 DIAGNOSIS — D649 Anemia, unspecified: Secondary | ICD-10-CM | POA: Diagnosis present

## 2022-05-28 DIAGNOSIS — J9602 Acute respiratory failure with hypercapnia: Secondary | ICD-10-CM | POA: Diagnosis not present

## 2022-05-28 DIAGNOSIS — Z79899 Other long term (current) drug therapy: Secondary | ICD-10-CM | POA: Diagnosis not present

## 2022-05-28 DIAGNOSIS — G40909 Epilepsy, unspecified, not intractable, without status epilepticus: Secondary | ICD-10-CM | POA: Diagnosis not present

## 2022-05-28 DIAGNOSIS — Z8701 Personal history of pneumonia (recurrent): Secondary | ICD-10-CM | POA: Diagnosis not present

## 2022-05-28 DIAGNOSIS — J45901 Unspecified asthma with (acute) exacerbation: Secondary | ICD-10-CM

## 2022-05-28 DIAGNOSIS — M1990 Unspecified osteoarthritis, unspecified site: Secondary | ICD-10-CM | POA: Diagnosis present

## 2022-05-28 DIAGNOSIS — Z77098 Contact with and (suspected) exposure to other hazardous, chiefly nonmedicinal, chemicals: Secondary | ICD-10-CM | POA: Diagnosis present

## 2022-05-28 DIAGNOSIS — I10 Essential (primary) hypertension: Secondary | ICD-10-CM | POA: Diagnosis present

## 2022-05-28 DIAGNOSIS — Z9851 Tubal ligation status: Secondary | ICD-10-CM | POA: Diagnosis not present

## 2022-05-28 DIAGNOSIS — E785 Hyperlipidemia, unspecified: Secondary | ICD-10-CM | POA: Diagnosis present

## 2022-05-28 DIAGNOSIS — K219 Gastro-esophageal reflux disease without esophagitis: Secondary | ICD-10-CM | POA: Diagnosis present

## 2022-05-28 DIAGNOSIS — J441 Chronic obstructive pulmonary disease with (acute) exacerbation: Secondary | ICD-10-CM

## 2022-05-28 DIAGNOSIS — J9601 Acute respiratory failure with hypoxia: Secondary | ICD-10-CM | POA: Diagnosis not present

## 2022-05-28 DIAGNOSIS — I739 Peripheral vascular disease, unspecified: Secondary | ICD-10-CM | POA: Diagnosis present

## 2022-05-28 DIAGNOSIS — D72829 Elevated white blood cell count, unspecified: Secondary | ICD-10-CM

## 2022-05-28 DIAGNOSIS — Z87442 Personal history of urinary calculi: Secondary | ICD-10-CM | POA: Diagnosis not present

## 2022-05-28 DIAGNOSIS — Z79890 Hormone replacement therapy (postmenopausal): Secondary | ICD-10-CM | POA: Diagnosis not present

## 2022-05-28 DIAGNOSIS — Z8673 Personal history of transient ischemic attack (TIA), and cerebral infarction without residual deficits: Secondary | ICD-10-CM | POA: Diagnosis not present

## 2022-05-28 DIAGNOSIS — Z981 Arthrodesis status: Secondary | ICD-10-CM | POA: Diagnosis not present

## 2022-05-28 DIAGNOSIS — R7303 Prediabetes: Secondary | ICD-10-CM | POA: Diagnosis present

## 2022-05-28 DIAGNOSIS — E559 Vitamin D deficiency, unspecified: Secondary | ICD-10-CM | POA: Diagnosis present

## 2022-05-28 DIAGNOSIS — R079 Chest pain, unspecified: Secondary | ICD-10-CM | POA: Diagnosis not present

## 2022-05-28 DIAGNOSIS — K5900 Constipation, unspecified: Secondary | ICD-10-CM | POA: Diagnosis not present

## 2022-05-28 LAB — CBC WITH DIFFERENTIAL/PLATELET
Abs Immature Granulocytes: 0.09 10*3/uL — ABNORMAL HIGH (ref 0.00–0.07)
Basophils Absolute: 0 10*3/uL (ref 0.0–0.1)
Basophils Relative: 0 %
Eosinophils Absolute: 0.1 10*3/uL (ref 0.0–0.5)
Eosinophils Relative: 1 %
HCT: 33.3 % — ABNORMAL LOW (ref 36.0–46.0)
Hemoglobin: 10.6 g/dL — ABNORMAL LOW (ref 12.0–15.0)
Immature Granulocytes: 1 %
Lymphocytes Relative: 27 %
Lymphs Abs: 4 10*3/uL (ref 0.7–4.0)
MCH: 27.2 pg (ref 26.0–34.0)
MCHC: 31.8 g/dL (ref 30.0–36.0)
MCV: 85.6 fL (ref 80.0–100.0)
Monocytes Absolute: 1.2 10*3/uL — ABNORMAL HIGH (ref 0.1–1.0)
Monocytes Relative: 8 %
Neutro Abs: 9.3 10*3/uL — ABNORMAL HIGH (ref 1.7–7.7)
Neutrophils Relative %: 63 %
Platelets: 379 10*3/uL (ref 150–400)
RBC: 3.89 MIL/uL (ref 3.87–5.11)
RDW: 15.7 % — ABNORMAL HIGH (ref 11.5–15.5)
WBC: 14.7 10*3/uL — ABNORMAL HIGH (ref 4.0–10.5)
nRBC: 0 % (ref 0.0–0.2)

## 2022-05-28 LAB — I-STAT ARTERIAL BLOOD GAS, ED
Acid-Base Excess: 5 mmol/L — ABNORMAL HIGH (ref 0.0–2.0)
Bicarbonate: 31.2 mmol/L — ABNORMAL HIGH (ref 20.0–28.0)
Calcium, Ion: 1.19 mmol/L (ref 1.15–1.40)
HCT: 33 % — ABNORMAL LOW (ref 36.0–46.0)
Hemoglobin: 11.2 g/dL — ABNORMAL LOW (ref 12.0–15.0)
O2 Saturation: 100 %
Patient temperature: 98.4
Potassium: 3.6 mmol/L (ref 3.5–5.1)
Sodium: 139 mmol/L (ref 135–145)
TCO2: 33 mmol/L — ABNORMAL HIGH (ref 22–32)
pCO2 arterial: 54.5 mmHg — ABNORMAL HIGH (ref 32–48)
pH, Arterial: 7.365 (ref 7.35–7.45)
pO2, Arterial: 229 mmHg — ABNORMAL HIGH (ref 83–108)

## 2022-05-28 LAB — RESPIRATORY PANEL BY PCR

## 2022-05-28 LAB — BASIC METABOLIC PANEL
Anion gap: 10 (ref 5–15)
BUN: 11 mg/dL (ref 6–20)
CO2: 28 mmol/L (ref 22–32)
Calcium: 8.7 mg/dL — ABNORMAL LOW (ref 8.9–10.3)
Chloride: 102 mmol/L (ref 98–111)
Creatinine, Ser: 0.88 mg/dL (ref 0.44–1.00)
GFR, Estimated: >60 mL/min (ref >60)
Glucose, Bld: 146 mg/dL — ABNORMAL HIGH (ref 70–99)
Potassium: 3.7 mmol/L (ref 3.5–5.1)
Sodium: 140 mmol/L (ref 135–145)

## 2022-05-28 LAB — CBC
HCT: 32.4 % — ABNORMAL LOW (ref 36.0–46.0)
Hemoglobin: 9.9 g/dL — ABNORMAL LOW (ref 12.0–15.0)
MCH: 26.5 pg (ref 26.0–34.0)
MCHC: 30.6 g/dL (ref 30.0–36.0)
MCV: 86.6 fL (ref 80.0–100.0)
Platelets: 362 10*3/uL (ref 150–400)
RBC: 3.74 MIL/uL — ABNORMAL LOW (ref 3.87–5.11)
RDW: 15.7 % — ABNORMAL HIGH (ref 11.5–15.5)
WBC: 14.9 10*3/uL — ABNORMAL HIGH (ref 4.0–10.5)
nRBC: 0 % (ref 0.0–0.2)

## 2022-05-28 LAB — TROPONIN I (HIGH SENSITIVITY)
Troponin I (High Sensitivity): 14 ng/L (ref <18)
Troponin I (High Sensitivity): 43 ng/L — ABNORMAL HIGH (ref <18)

## 2022-05-28 MED ORDER — CARBAMAZEPINE 200 MG PO TABS
400.0000 mg | ORAL_TABLET | Freq: Two times a day (BID) | ORAL | Status: DC
  Administered 2022-05-28 – 2022-06-01 (×9): 400 mg via ORAL
  Filled 2022-05-28 (×11): qty 2

## 2022-05-28 MED ORDER — MONTELUKAST SODIUM 10 MG PO TABS
10.0000 mg | ORAL_TABLET | Freq: Every day | ORAL | Status: DC
  Administered 2022-05-28 – 2022-05-31 (×4): 10 mg via ORAL
  Filled 2022-05-28 (×4): qty 1

## 2022-05-28 MED ORDER — CYCLOBENZAPRINE HCL 10 MG PO TABS
5.0000 mg | ORAL_TABLET | Freq: Three times a day (TID) | ORAL | Status: DC | PRN
Start: 2022-05-28 — End: 2022-06-01

## 2022-05-28 MED ORDER — BUDESONIDE 0.25 MG/2ML IN SUSP
0.2500 mg | Freq: Two times a day (BID) | RESPIRATORY_TRACT | Status: DC
Start: 2022-05-28 — End: 2022-06-01
  Administered 2022-05-28 – 2022-06-01 (×9): 0.25 mg via RESPIRATORY_TRACT
  Filled 2022-05-28 (×9): qty 2

## 2022-05-28 MED ORDER — FAMOTIDINE 20 MG PO TABS: 20.0000 mg | ORAL_TABLET | Freq: Every day | ORAL | Status: DC | PRN

## 2022-05-28 MED ORDER — TRAZODONE HCL 100 MG PO TABS
100.0000 mg | ORAL_TABLET | Freq: Every evening | ORAL | Status: DC | PRN
Start: 2022-05-28 — End: 2022-06-01

## 2022-05-28 MED ORDER — ALBUTEROL SULFATE (2.5 MG/3ML) 0.083% IN NEBU: 2.5000 mg | INHALATION_SOLUTION | RESPIRATORY_TRACT | Status: DC | PRN

## 2022-05-28 MED ORDER — AZITHROMYCIN 500 MG PO TABS
500.0000 mg | ORAL_TABLET | Freq: Every day | ORAL | Status: DC
Start: 2022-05-28 — End: 2022-06-01
  Administered 2022-05-28 – 2022-06-01 (×5): 500 mg via ORAL
  Filled 2022-05-28: qty 1
  Filled 2022-05-28: qty 2
  Filled 2022-05-28 (×3): qty 1

## 2022-05-28 MED ORDER — ATORVASTATIN CALCIUM 40 MG PO TABS
40.0000 mg | ORAL_TABLET | Freq: Every day | ORAL | Status: DC
  Administered 2022-05-28 – 2022-06-01 (×5): 40 mg via ORAL
  Filled 2022-05-28 (×5): qty 1

## 2022-05-28 MED ORDER — ACETAMINOPHEN 325 MG PO TABS
650.0000 mg | ORAL_TABLET | Freq: Four times a day (QID) | ORAL | Status: DC | PRN
  Administered 2022-05-28: 650 mg via ORAL
  Filled 2022-05-28: qty 2

## 2022-05-28 MED ORDER — LORATADINE 10 MG PO TABS
10.0000 mg | ORAL_TABLET | Freq: Every evening | ORAL | Status: DC
  Administered 2022-05-28 – 2022-05-31 (×4): 10 mg via ORAL
  Filled 2022-05-28 (×4): qty 1

## 2022-05-28 MED ORDER — GABAPENTIN 100 MG PO CAPS
100.0000 mg | ORAL_CAPSULE | Freq: Three times a day (TID) | ORAL | Status: DC
  Administered 2022-05-28 – 2022-06-01 (×12): 100 mg via ORAL
  Filled 2022-05-28 (×12): qty 1

## 2022-05-28 MED ORDER — ALBUTEROL SULFATE (2.5 MG/3ML) 0.083% IN NEBU
10.0000 mg/h | INHALATION_SOLUTION | Freq: Once | RESPIRATORY_TRACT | Status: AC
  Administered 2022-05-28: 10 mg/h via RESPIRATORY_TRACT
  Filled 2022-05-28: qty 12
  Filled 2022-05-28: qty 3

## 2022-05-28 MED ORDER — BUDESON-GLYCOPYRROL-FORMOTEROL 160-9-4.8 MCG/ACT IN AERO
2.0000 | INHALATION_SPRAY | Freq: Every day | RESPIRATORY_TRACT | Status: DC
Start: 2022-05-28 — End: 2022-05-28

## 2022-05-28 MED ORDER — METHYLPREDNISOLONE SODIUM SUCC 125 MG IJ SOLR
120.0000 mg | INTRAMUSCULAR | Status: DC
Start: 2022-05-28 — End: 2022-05-30
  Administered 2022-05-28 – 2022-05-30 (×3): 120 mg via INTRAVENOUS
  Filled 2022-05-28 (×3): qty 2

## 2022-05-28 MED ORDER — LEVOCETIRIZINE DIHYDROCHLORIDE 5 MG PO TABS: 5.0000 mg | ORAL_TABLET | Freq: Every evening | ORAL | Status: DC

## 2022-05-28 MED ORDER — IPRATROPIUM-ALBUTEROL 0.5-2.5 (3) MG/3ML IN SOLN: 3.0000 mL | RESPIRATORY_TRACT | Status: DC | PRN

## 2022-05-28 MED ORDER — ENOXAPARIN SODIUM 40 MG/0.4ML IJ SOSY
40.0000 mg | PREFILLED_SYRINGE | INTRAMUSCULAR | Status: DC
  Administered 2022-05-28 – 2022-05-31 (×4): 40 mg via SUBCUTANEOUS
  Filled 2022-05-28 (×4): qty 0.4

## 2022-05-28 MED ORDER — KETOTIFEN FUMARATE 0.025 % OP SOLN
1.0000 [drp] | Freq: Two times a day (BID) | OPHTHALMIC | Status: DC
  Administered 2022-05-28 – 2022-06-01 (×8): 1 [drp] via OPHTHALMIC
  Filled 2022-05-28: qty 5

## 2022-05-28 MED ORDER — IRBESARTAN 150 MG PO TABS
75.0000 mg | ORAL_TABLET | Freq: Every day | ORAL | Status: DC
  Administered 2022-05-28 – 2022-06-01 (×5): 75 mg via ORAL
  Filled 2022-05-28 (×5): qty 1

## 2022-05-28 MED ORDER — ESTRADIOL 1 MG PO TABS
0.5000 mg | ORAL_TABLET | Freq: Every day | ORAL | Status: DC
  Administered 2022-05-28 – 2022-06-01 (×5): 0.5 mg via ORAL
  Filled 2022-05-28 (×5): qty 0.5

## 2022-05-28 MED ORDER — ACETAMINOPHEN 650 MG RE SUPP: 650.0000 mg | Freq: Four times a day (QID) | RECTAL | Status: DC | PRN

## 2022-05-28 MED ORDER — FLUTICASONE PROPIONATE 50 MCG/ACT NA SUSP: 1.0000 | Freq: Two times a day (BID) | NASAL | Status: DC | PRN

## 2022-05-28 MED ORDER — IPRATROPIUM-ALBUTEROL 0.5-2.5 (3) MG/3ML IN SOLN
3.0000 mL | Freq: Four times a day (QID) | RESPIRATORY_TRACT | Status: DC
  Administered 2022-05-28 (×4): 3 mL via RESPIRATORY_TRACT
  Filled 2022-05-28 (×4): qty 3

## 2022-05-28 MED ORDER — PANTOPRAZOLE SODIUM 40 MG PO TBEC
40.0000 mg | DELAYED_RELEASE_TABLET | Freq: Every day | ORAL | Status: DC
  Administered 2022-05-28 – 2022-06-01 (×5): 40 mg via ORAL
  Filled 2022-05-28 (×5): qty 1

## 2022-05-28 NOTE — ED Notes (Signed)
Patient c/o mild headache.  Medicated per PRN order

## 2022-05-28 NOTE — ED Notes (Signed)
Pt presents in no distress; only complaint is hunger- meal tray coming.

## 2022-05-28 NOTE — Telephone Encounter (Signed)
Patient called and wanted Dr. Lamonte Sakai and his nurse to know she is currently in the hospital and they are keeping her overnight. Patient has an appointment scheduled 05/30/2022 and would like to try to make it.  FYI.

## 2022-05-28 NOTE — Progress Notes (Signed)
Pt states she don't wear CPAP or BIPAP at home.  No distress noted at this time.

## 2022-05-28 NOTE — H&P (Signed)
History and Physical    [name] HER:[id number] DOB: May 04, 1961 DOA: 05/27/2022  PCP: Biagio Borg, MD  Patient coming from: Home  Chief Complaint: Shortness of breath  HPI: [name] is a 61 y.o. female with medical history significant of asthma-COPD overlap syndrome, allergic rhinitis, anxiety/depression, GERD, hypertension, hyperlipidemia, PVD, prediabetes, seizure, stroke.  Recently admitted 5/15-5/17 for acute hypoxic and hypercapnic respiratory failure requiring BiPAP.  She presented to the ED tonight via EMS for acute onset shortness of breath/respiratory distress.  Upon EMS arrival, she was tripoding, diaphoretic, with minimal air movement.  She was placed on CPAP and was given given albuterol, Atrovent, IV magnesium 2 g, and Solu-Medrol 125 mg.  Upon arrival to the ED, she was placed on BiPAP.  Afebrile.  Labs showing WBC 14.7, hemoglobin 10.6 (stable), platelet count 379k.  Sodium 140, potassium 3.7, chloride 102, bicarb 28, BUN 11, creatinine 0.8, glucose 146.  Initial high-sensitivity troponin negative, repeat pending.  EKG without acute ischemic changes.  ABG showing pH 7.36, PCO2 54, PO2 229.  Chest x-ray mild pulmonary hyperinflation with clear lungs. She was given albuterol neb treatment.  Patient states she was doing well until this weekend on Friday when her grandson visited her and was using some type of toy gun which requires "propane" and after she inhaled the fumes she started having shortness of breath, wheezing, and substernal pressure-like chest pain.  She was using her rescue inhaler more frequent than usual and was also using albuterol nebulizer.  She uses her maintenance inhaler Beztri regularly, 2 puffs in the morning and 2 puffs at night.  Also takes Singulair regularly.  Symptoms became acutely worse tonight prompting her to seek medical attention.  States she was doing well last week, going to the gym every day and received Dupixent  injection on Wednesday.  She does not use oxygen at home.  Patient states she feels better after receiving medications and being placed on BiPAP.  Review of Systems:  Review of Systems  All other systems reviewed and are negative.   Past Medical History:  Diagnosis Date   Anemia, iron deficiency 12/22/2014   pt. 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      Bipolar disorder ([place])    COPD (chronic obstructive pulmonary disease) ([place])    Depression    Eczema 05/18/2022   Fibromyalgia 05/14/2014   GERD (gastroesophageal reflux disease)    History of kidney stones    Hyperlipidemia 04/20/2017   HYPERTENSION 05/12/2009   Qualifier: Diagnosis of  By: Lenn Cal Deborra Medina), [name]     Kidney stones    Peripheral vascular disease ([place])    Pneumonia    Prediabetes 02/23/2014   pt. denies   Seizure ([place])    Stroke ([place]) 11/2020   Urticaria     Past Surgical History:  Procedure Laterality Date   ABDOMINAL HYSTERECTOMY     ANTERIOR CERVICAL DECOMP/DISCECTOMY FUSION N/A 07/28/2020   Procedure: ANTERIOR CERVICAL DECOMPRESSION/DISCECTOMY FUSION. INTERBODY PROTHESIS, PLATE/SCREWS CERVICAL THREE- CERVICAL FOUR, CERVICAL FOUR- CERVICAL FIVE;  Surgeon: Newman Pies, MD;  Location: [facility];  Service: Neurosurgery;  Laterality: N/A;   BACK SURGERY     COLONOSCOPY  12/20/2011   Procedure: COLONOSCOPY;  Surgeon: Landry Dyke, MD;  Location: [facility];  Service:  Endoscopy;  Laterality: N/A;   COLONOSCOPY  03/05/2012   Procedure: COLONOSCOPY;  Surgeon: Landry Dyke, MD;  Location: [facility];  Service: Endoscopy;  Laterality: N/A;   DIAGNOSTIC LAPAROSCOPY     HEMORRHOID SURGERY     INCISE AND DRAIN ABCESS     KIDNEY STONE SURGERY     NECK SURGERY     x 2 Dr Orinda Kenner   SPINE SURGERY  2019    TOE SURGERY     TUBAL LIGATION       reports that she quit smoking about 3 years ago. Her smoking use included cigarettes. She has a 9.00 pack-year smoking history. She has never used smokeless tobacco. She reports that she does not currently use alcohol. She reports current drug use. Drug: Marijuana.  Allergies  Allergen Reactions   Sulfa Antibiotics Nausea And Vomiting   Belsomra [Suvorexant] Other (See Comments)    hallucinations   Venofer [Iron Sucrose] Hives    Family History  Problem Relation Age of Onset   Diabetes Mother    COPD Mother    Heart disease Mother    Asthma Mother    Diabetes Father    Kidney disease Father    Anesthesia problems Father    Alcohol abuse Father    Colon cancer Father 65   Diabetes Sister    Hypertension Sister    Heart disease Sister    Diabetes Sister    Brain cancer Sister    Hypertension Sister    Asthma Sister    Diabetes Sister    COPD Sister    Hypertension Sister    Anemia Sister    Hypertension Sister    Diabetes Sister    Anemia Sister    Hypertension Sister    Diabetes Brother    Sleep apnea Brother    Asthma Brother    Alcohol abuse Brother    Heart disease Brother    Heart disease Brother    Hypertension Brother    Hypertension Daughter    Anemia Daughter    Allergic rhinitis Neg Hx    Eczema Neg Hx    Urticaria Neg Hx    Esophageal cancer Neg Hx    Prostate cancer Neg Hx    Rectal cancer Neg Hx     Prior to Admission medications   Medication Sig Start Date End Date Taking? Authorizing Provider  acetaminophen (TYLENOL) 325 MG tablet Take 2 tablets (650 mg total) by mouth every 6 (six) hours as needed for mild pain (or Fever >/= 101). 02/07/22   Elgergawy, Silver Huguenin, MD  albuterol (PROVENTIL) (2.5 MG/3ML) 0.083% nebulizer solution Take 3 mLs (2.5 mg total) by nebulization every 6 (six) hours as needed for wheezing or shortness of breath. Reported on 06/04/2016 08/11/20   Lauraine Rinne, NP  albuterol (VENTOLIN  HFA) 108 (90 Base) MCG/ACT inhaler Inhale 2 puffs into the lungs every 4 (four) hours as needed for wheezing or shortness of breath. 04/11/22   Biagio Borg, MD  atorvastatin (LIPITOR) 40 MG tablet Take 1 tablet (40 mg total) by mouth daily. TAKE 1 TABLET(40 MG) BY MOUTH DAILY Strength: 40 mg 05/23/22   Biagio Borg, MD  azelastine (OPTIVAR) 0.05 % ophthalmic solution Place 1 drop into both eyes 2 (two) times daily. 05/18/22   Biagio Borg, MD  Biotin 1000 MCG tablet Take 1,000 mcg by mouth daily.    [provider]  Budeson-Glycopyrrol-Formoterol (BREZTRI AEROSPHERE) 160-9-4.8 MCG/ACT AERO INHALE 2 PUFFS INTO  THE LUNGS TWICE DAILY WITH SPACER Patient taking differently: Inhale 2 puffs into the lungs 2 (two) times daily. WITH SPACER 10/11/21   Biagio Borg, MD  Budeson-Glycopyrrol-Formoterol (BREZTRI AEROSPHERE) 160-9-4.8 MCG/ACT AERO Inhale 2 puffs into the lungs in the morning and at bedtime. 05/24/22   Collene Gobble, MD  carbamazepine (TEGRETOL) 200 MG tablet Take 2 tablets (400 mg total) by mouth 2 (two) times daily. 03/23/22   Plovsky, Berneta Sages, MD  cetirizine (ZYRTEC) 10 MG tablet Can take one tablet by mouth once daily if needed for runny nose. Patient taking differently: Take 10 mg by mouth daily as needed for rhinitis. 04/28/21   Dara Hoyer, FNP  cyclobenzaprine (FLEXERIL) 5 MG tablet TAKE 1 TABLET(5 MG) BY MOUTH THREE TIMES DAILY AS NEEDED FOR MUSCLE SPASMS 05/23/22   Biagio Borg, MD  diclofenac Sodium (VOLTAREN) 1 % GEL Apply 4 g topically 4 (four) times daily. Patient taking differently: Apply 4 g topically 4 (four) times daily as needed (pain). 06/04/20   Deno Etienne, DO  DUPIXENT 300 MG/2ML prefilled syringe Inject 300 mg into the skin every 28 (twenty-eight) days. 04/18/22   [provider]  estradiol (ESTRACE) 0.5 MG tablet Take 1 tablet (0.5 mg total) by mouth daily. 09/21/21   Princess Bruins, MD  famotidine (PEPCID) 20 MG tablet Take 20 mg by mouth daily as needed for  heartburn or indigestion. OTC    [provider]  fluticasone (FLONASE) 50 MCG/ACT nasal spray Place 1 spray into both nostrils 2 (two) times daily as needed for allergies or rhinitis. 09/06/21   Valentina Shaggy, MD  gabapentin (NEURONTIN) 100 MG capsule Take 1 capsule (100 mg total) by mouth 3 (three) times daily. 03/23/22   Plovsky, Berneta Sages, MD  hydrochlorothiazide (MICROZIDE) 12.5 MG capsule Take 1 capsule (12.5 mg total) by mouth daily. TAKE 1 CAPSULE(12.5 MG) BY MOUTH DAILY Strength: 12.5 mg 05/23/22   Biagio Borg, MD  hydrOXYzine (VISTARIL) 25 MG capsule Take 1 when angry and if not calmer in 1 hour repeat Patient taking differently: Take 25 mg by mouth See admin instructions. Take 1 capsule when becoming angry and repeat in 1 hour if not calmer. 03/23/22   Plovsky, Berneta Sages, MD  ipratropium (ATROVENT HFA) 17 MCG/ACT inhaler Inhale 2 puffs into the lungs every 6 (six) hours as needed for wheezing. 01/09/22   Valentina Shaggy, MD  irbesartan (AVAPRO) 75 MG tablet TAKE 1 TABLET(75 MG) BY MOUTH DAILY Patient taking differently: Take 75 mg by mouth daily. 11/06/21   Biagio Borg, MD  levocetirizine (XYZAL) 5 MG tablet Take 1 tablet (5 mg total) by mouth every evening. 09/06/21   Valentina Shaggy, MD  montelukast (SINGULAIR) 10 MG tablet TAKE 1 TABLET(10 MG) BY MOUTH AT BEDTIME Patient taking differently: Take 10 mg by mouth at bedtime. 12/06/21   Collene Gobble, MD  ondansetron (ZOFRAN ODT) 4 MG disintegrating tablet Take 1 tablet (4 mg total) by mouth every 8 (eight) hours as needed for nausea or vomiting. 05/19/21   Valentina Shaggy, MD  pantoprazole (PROTONIX) 40 MG tablet TAKE 1 TABLET BY MOUTH EVERY DAY Patient taking differently: Take 40 mg by mouth daily. 05/24/21   Noralyn Pick, NP  potassium chloride (KLOR-CON M) 10 MEQ tablet Take 1 tablet (10 mEq total) by mouth daily. 05/23/22   Biagio Borg, MD  predniSONE (DELTASONE) 10 MG tablet Take 1 tablet (10 mg  total) by mouth daily with breakfast. Patient taking  differently: Take 10 mg by mouth See admin instructions. Qd x 30 days 04/11/22   Biagio Borg, MD  traZODone (DESYREL) 100 MG tablet Take 1 tablet (100 mg total) by mouth at bedtime as needed for sleep. 05/18/22   Biagio Borg, MD  triamcinolone ointment (KENALOG) 0.1 % Apply 1 application. topically 2 (two) times daily. 05/18/22   Biagio Borg, MD  UNKNOWN TO PATIENT Weekly allergy shots    [provider]    Physical Exam: Vitals:   05/28/22 0100 05/28/22 0105 05/28/22 0130 05/28/22 0145  BP:  133/81 (!) 152/90 125/85  Pulse: 99 99 (!) 106 100  Resp: (!) 25 19 (!) 24 20  Temp:      TempSrc:      SpO2: 100% 100% 100% 98%    Physical Exam Vitals reviewed.  Constitutional:      General: She is not in acute distress. HENT:     Head: Normocephalic and atraumatic.  Eyes:     Extraocular Movements: Extraocular movements intact.  Cardiovascular:     Rate and Rhythm: Normal rate and regular rhythm.     Pulses: Normal pulses.  Pulmonary:     Effort: No respiratory distress.     Breath sounds: Wheezing present.     Comments: Diminished breath sounds Abdominal:     General: Bowel sounds are normal. There is no distension.     Palpations: Abdomen is soft.     Tenderness: There is no abdominal tenderness.  Musculoskeletal:        General: No swelling or tenderness.     Cervical back: Normal range of motion.  Skin:    General: Skin is warm and dry.  Neurological:     General: No focal deficit present.     Mental Status: She is alert and oriented to person, place, and time.      Labs on Admission: I have personally reviewed following labs and imaging studies  CBC: Recent Labs  Lab 05/28/22 0022 05/28/22 0040  WBC 14.7*  --   NEUTROABS 9.3*  --   HGB 10.6* 11.2*  HCT 33.3* 33.0*  MCV 85.6  --   PLT 379  --    Basic Metabolic Panel: Recent Labs  Lab 05/28/22 0022 05/28/22 0040  NA 140 139  K 3.7 3.6  CL  102  --   CO2 28  --   GLUCOSE 146*  --   BUN 11  --   CREATININE 0.88  --   CALCIUM 8.7*  --    GFR: Estimated Creatinine Clearance: 63.7 mL/min (by C-G formula based on SCr of 0.88 mg/dL). Liver Function Tests: No results for input(s): "AST", "ALT", "ALKPHOS", "BILITOT", "PROT", "ALBUMIN" in the last 168 hours. No results for input(s): "LIPASE", "AMYLASE" in the last 168 hours. No results for input(s): "AMMONIA" in the last 168 hours. Coagulation Profile: No results for input(s): "INR", "PROTIME" in the last 168 hours. Cardiac Enzymes: No results for input(s): "CKTOTAL", "CKMB", "CKMBINDEX", "TROPONINI" in the last 168 hours. BNP (last 3 results) No results for input(s): "PROBNP" in the last 8760 hours. HbA1C: No results for input(s): "HGBA1C" in the last 72 hours. CBG: No results for input(s): "GLUCAP" in the last 168 hours. Lipid Profile: No results for input(s): "CHOL", "HDL", "LDLCALC", "TRIG", "CHOLHDL", "LDLDIRECT" in the last 72 hours. Thyroid Function Tests: No results for input(s): "TSH", "T4TOTAL", "FREET4", "T3FREE", "THYROIDAB" in the last 72 hours. Anemia Panel: No results for input(s): "VITAMINB12", "FOLATE", "FERRITIN", "TIBC", "  IRON", "RETICCTPCT" in the last 72 hours. Urine analysis:    Component Value Date/Time   COLORURINE YELLOW 05/18/2022 [place] 05/18/2022 1603   LABSPEC 1.015 05/18/2022 1603   PHURINE 6.0 05/18/2022 1603   GLUCOSEU NEGATIVE 05/18/2022 1603   HGBUR NEGATIVE 05/18/2022 1603   BILIRUBINUR NEGATIVE 05/18/2022 1603   KETONESUR NEGATIVE 05/18/2022 1603   PROTEINUR NEGATIVE 05/05/2021 1922   UROBILINOGEN 0.2 05/18/2022 1603   NITRITE NEGATIVE 05/18/2022 1603   LEUKOCYTESUR NEGATIVE 05/18/2022 1603    Radiological Exams on Admission: I have personally reviewed images DG Chest Port 1 View  Result Date: 05/28/2022 CLINICAL DATA:  SOB, BIPAP EXAM: PORTABLE CHEST 1 VIEW COMPARISON:  05/01/2022 FINDINGS: The lungs are  mildly hyperinflated, progressive since prior examination with flattening of the hemidiaphragms. The lungs are clear. No pneumothorax or pleural effusion. Cardiac size within normal limits. Pulmonary vascularity is normal. Cervical fusion hardware partially visualized. No acute bone abnormality. IMPRESSION: Mild pulmonary hyperinflation. Electronically Signed   By: Fidela Salisbury M.D.   On: 05/28/2022 00:12    EKG: Independently reviewed.  Sinus tachycardia, no acute ischemic changes.  Assessment and Plan  Acute exacerbation of asthma-COPD overlap syndrome Acute hypoxic hypercapnic respiratory failure Possibly triggered by recent exposure to propane fumes.  Patient was initially in respiratory distress, tripoding, diaphoretic, with minimal air movement with EMS. She was placed on CPAP and was given given albuterol, Atrovent, IV magnesium 2 g, and Solu-Medrol 125 mg.  Upon arrival to the ED, she was placed on BiPAP and given additional albuterol neb treatment.  ABG with evidence of hypercapnia.  Currently stable on BiPAP but still wheezing. -Solu-Medrol 60 mg every 12 hours -DuoNeb every 6 hours -Albuterol neb every 2 hours as needed -Pulmicort neb twice daily -Continue Xyzal and Singulair after pharmacy med rec is done. -Do not think antibiotics are indicated at this time given no cough or sputum production.  Chest x-ray without evidence of pneumonia. -Continue BiPAP, wean as tolerated -RVP  Chest pain Likely due to bronchospasm as it improved after she was given bronchodilator treatments.  ACS less likely as high-sensitivity troponin negative and EKG without acute ischemic changes. -Second set of troponin pending  Leukocytosis Likely due to steroids.  No infectious signs or symptoms. -Continue to monitor  Hypertension -Continue irbesartan and hydrochlorothiazide after pharmacy med rec is done.  Hyperlipidemia -Continue Lipitor after pharmacy med rec is done.  Seizure  disorder -Continue Tegretol after pharmacy med rec is done.  DVT prophylaxis: Lovenox Code Status: Full Code (discussed with the patient) Family Communication: Boyfriend at bedside. Level of care: Progressive Care Unit Admission status: It is my clinical opinion that admission to INPATIENT is reasonable and necessary because of the expectation that this patient will require hospital care that crosses at least 2 midnights to treat this condition based on the medical complexity of the problems presented.  Given the aforementioned information, the predictability of an adverse outcome is felt to be significant.   Shela Leff MD [facility]  If 7PM-7AM, please contact night-coverage [email or web address]  05/28/2022, 2:35 AM

## 2022-05-28 NOTE — Progress Notes (Signed)
Patient admitted after midnight, please see H&P.  Here with SOB after exposure to fumes from a toy gun.    Acute exacerbation of asthma-COPD overlap syndrome Acute hypoxic hypercapnic respiratory failure Possibly triggered by recent exposure to propane fumes.  Patient was initially in respiratory distress, tripoding, diaphoretic, with minimal air movement with EMS. She was placed on BIPAP and was given given albuterol, Atrovent, IV magnesium 2 g, and Solu-Medrol 125 mg.   -Solu-Medrol IV -DuoNeb every 6 hours -Albuterol neb every 2 hours as needed -Pulmicort neb twice daily -Continue Xyzal and Singulair  -weaned off BIPAP -RVP negative -covid pending -wean to RA   Chest pain Likely due to bronchospasm as it improved after she was given bronchodilator treatments.    Leukocytosis Likely due to steroids.  No infectious signs or symptoms. -Continue to monitor   Hypertension -Continue irbesartan   Hyperlipidemia -Continue Lipitor   Seizure disorder -Continue Tegretol     [name]

## 2022-05-28 NOTE — Progress Notes (Signed)
Pt placed on 2L [place] tolerating well at this time. No increased WOB noted RR 20 spo2 100%

## 2022-05-29 ENCOUNTER — Encounter (HOSPITAL_COMMUNITY): Payer: Self-pay | Admitting: Internal Medicine

## 2022-05-29 DIAGNOSIS — J45901 Unspecified asthma with (acute) exacerbation: Secondary | ICD-10-CM | POA: Diagnosis not present

## 2022-05-29 DIAGNOSIS — J441 Chronic obstructive pulmonary disease with (acute) exacerbation: Secondary | ICD-10-CM | POA: Diagnosis not present

## 2022-05-29 LAB — TROPONIN I (HIGH SENSITIVITY)
Troponin I (High Sensitivity): 14 ng/L (ref <18)
Troponin I (High Sensitivity): 17 ng/L (ref <18)

## 2022-05-29 MED ORDER — IPRATROPIUM-ALBUTEROL 0.5-2.5 (3) MG/3ML IN SOLN
RESPIRATORY_TRACT | Status: AC
  Filled 2022-05-29: qty 3

## 2022-05-29 MED ORDER — IPRATROPIUM-ALBUTEROL 0.5-2.5 (3) MG/3ML IN SOLN
3.0000 mL | Freq: Four times a day (QID) | RESPIRATORY_TRACT | Status: DC
  Administered 2022-05-29 – 2022-05-30 (×6): 3 mL via RESPIRATORY_TRACT
  Filled 2022-05-29 (×6): qty 3

## 2022-05-29 MED ORDER — ARFORMOTEROL TARTRATE 15 MCG/2ML IN NEBU
15.0000 ug | INHALATION_SOLUTION | Freq: Two times a day (BID) | RESPIRATORY_TRACT | Status: DC
  Administered 2022-05-29 – 2022-06-01 (×6): 15 ug via RESPIRATORY_TRACT
  Filled 2022-05-29 (×6): qty 2

## 2022-05-29 MED ORDER — VITAMIN D (ERGOCALCIFEROL) 1.25 MG (50000 UNIT) PO CAPS
50000.0000 [IU] | ORAL_CAPSULE | ORAL | Status: DC
  Administered 2022-05-29: 50000 [IU] via ORAL
  Filled 2022-05-29: qty 1

## 2022-05-29 NOTE — Telephone Encounter (Signed)
We will leave her appointment in place, work on rescheduling her if she is not yet out of the hospital on 6/14

## 2022-05-29 NOTE — Progress Notes (Signed)
Hemodynamics stable, on room air at rest SPO2 99%. Pt had complaints of  mild SOB and wheezing with exertion when ambulated to the restroom. But breath sound at rest was clear on auscultation bilaterally with no appearing of acute respiratory distress. RR 18, SPO2 remained 98-99% on room air.  RT made aware. We will monitor.  Kennyth Lose, RN

## 2022-05-29 NOTE — Progress Notes (Signed)
PROGRESS NOTE    [name]  [id number]:[id number] DOB: 28-Jul-1961 DOA: 05/27/2022 PCP: Biagio Borg, MD   Brief Narrative: 61 year old with past medical history significant for asthma/COPD overlap syndrome, allergic rhinitis, anxiety depression, GERD, hypertension, PVD, prediabetes, seizure, stroke.  Recently admitted 5/15 until 5/17 for acute hypoxic hypercapnic respiratory failure requiring BiPAP.  She presented the night of admission via EMS for acute onset of shortness of breath and respiratory distress.  On EMS arrival she was diaphoretic, tripoding.  She was placed on CPAP and was giving albuterol Atrovent and IV magnesium, IV Solu-Medrol.  On arrival to the ED she was placed on BiPAP.  ABG showed a pH of 7.3 PCO2 54 PO2 229.  Chest x-ray mild pulmonary hyperinflation.  She was doing well until this weekend on Friday when her grandson visited her and was using some type of toy gun which required "propane" and after she inhaled the fumes she is started having worsening shortness of breath and wheezing.  She also reported pressure-like chest pain.  Symptoms became worse, so she presented the night of admission for further evaluation.     Assessment & Plan:   Principal Problem:   Acute exacerbation of COPD with asthma ([facility]) Active Problems:   Acute respiratory failure with hypoxia and hypercapnia (HCC)   Seizure disorder (HCC)   Chest pain   Leukocytosis   Asthma exacerbation   1-Acute exacerbation of asthma, COPD overlap syndrome Acute hypoxic/hypercapnic respiratory failure -Possible triggered by recent exposure to propane fumes -Continue with as scheduled nebulizer -IV Solu-Medrol -Continue with Pulmicort. Start Brovana.  -Start guaifenesin, flutter valve.  -Continue with azithromycin.  -Continue with Singulair  2-Chest pain; likely secondary to bronchospasm. Reports chest pain this morning while she was trying to clean herself.  Repeated EKG normal sinus  rhythm Plan to cycle enzymes  Leukocytosis in the setting of a steroids  Hypertension: Continue with ibersartan Hold HCTZ   Lipidemia: Continue with Lipitor Vitamin D deficiency; start supplement.   Seizure disorder: Continue with Tegretol  Estimated body mass index is 27.73 kg/m as calculated from the following:   Height as of this encounter: '5\' 3"'$  (1.6 m).   Weight as of this encounter: 71 kg.   DVT prophylaxis: Lovenox Code Status: Full code Family Communication: Care discussed with patient Disposition Plan:  Status is: Inpatient Remains inpatient appropriate because: Continue management of COPD asthma exacerbation    Consultants:  None  Procedures:  None  Antimicrobials:  Azithromycin.   Subjective: She is still wheezing. Report chest tightness, worse when she takes deep breath.   Objective: Vitals:   05/28/22 2251 05/29/22 0351 05/29/22 0810 05/29/22 0856  BP: 119/72 104/79 108/71   Pulse: 81 81 80   Resp: '20 18 17   '$ Temp: 97.6 F (36.4 C) 98.5 F (36.9 C) 98.1 F (36.7 C)   TempSrc: Oral Oral Oral   SpO2: 98% 99% 98% 99%  Weight:      Height:        Intake/Output Summary (Last 24 hours) at 05/29/2022 1256 Last data filed at 05/29/2022 0811 Gross per 24 hour  Intake 490 ml  Output --  Net 490 ml   Filed Weights   05/28/22 1840  Weight: 71 kg    Examination:  General exam: Appears calm and comfortable  Respiratory system: BL expiratory wheezing Cardiovascular system: S1 & S2 heard, RRR. No JVD, murmurs, rubs, gallops or clicks. No pedal edema. Gastrointestinal system: Abdomen is nondistended, soft and nontender. No  organomegaly or masses felt. Normal bowel sounds heard. Central nervous system: Alert and oriented. Extremities: Symmetric 5 x 5 power.   Data Reviewed: I have personally reviewed following labs and imaging studies  CBC: Recent Labs  Lab 05/28/22 0022 05/28/22 0040 05/28/22 0512  WBC 14.7*  --  14.9*  NEUTROABS 9.3*   --   --   HGB 10.6* 11.2* 9.9*  HCT 33.3* 33.0* 32.4*  MCV 85.6  --  86.6  PLT 379  --  372   Basic Metabolic Panel: Recent Labs  Lab 05/28/22 0022 05/28/22 0040  NA 140 139  K 3.7 3.6  CL 102  --   CO2 28  --   GLUCOSE 146*  --   BUN 11  --   CREATININE 0.88  --   CALCIUM 8.7*  --    GFR: Estimated Creatinine Clearance: 65 mL/min (by C-G formula based on SCr of 0.88 mg/dL). Liver Function Tests: No results for input(s): "AST", "ALT", "ALKPHOS", "BILITOT", "PROT", "ALBUMIN" in the last 168 hours. No results for input(s): "LIPASE", "AMYLASE" in the last 168 hours. No results for input(s): "AMMONIA" in the last 168 hours. Coagulation Profile: No results for input(s): "INR", "PROTIME" in the last 168 hours. Cardiac Enzymes: No results for input(s): "CKTOTAL", "CKMB", "CKMBINDEX", "TROPONINI" in the last 168 hours. BNP (last 3 results) No results for input(s): "PROBNP" in the last 8760 hours. HbA1C: No results for input(s): "HGBA1C" in the last 72 hours. CBG: No results for input(s): "GLUCAP" in the last 168 hours. Lipid Profile: No results for input(s): "CHOL", "HDL", "LDLCALC", "TRIG", "CHOLHDL", "LDLDIRECT" in the last 72 hours. Thyroid Function Tests: No results for input(s): "TSH", "T4TOTAL", "FREET4", "T3FREE", "THYROIDAB" in the last 72 hours. Anemia Panel: No results for input(s): "VITAMINB12", "FOLATE", "FERRITIN", "TIBC", "IRON", "RETICCTPCT" in the last 72 hours. Sepsis Labs: No results for input(s): "PROCALCITON", "LATICACIDVEN" in the last 168 hours.  Recent Results (from the past 240 hour(s))  Respiratory (~20 pathogens) panel by PCR     Status: None   Collection Time: 05/28/22  5:12 AM   Specimen: Nasopharyngeal Swab; Respiratory  Result Value Ref Range Status   Adenovirus NOT DETECTED NOT DETECTED Final   Coronavirus 229E NOT DETECTED NOT DETECTED Final    Comment: (NOTE) The Coronavirus on the Respiratory Panel, DOES NOT test for the novel   Coronavirus (2019 nCoV)    Coronavirus HKU1 NOT DETECTED NOT DETECTED Final   Coronavirus NL63 NOT DETECTED NOT DETECTED Final   Coronavirus OC43 NOT DETECTED NOT DETECTED Final   Metapneumovirus NOT DETECTED NOT DETECTED Final   Rhinovirus / Enterovirus NOT DETECTED NOT DETECTED Final   Influenza A NOT DETECTED NOT DETECTED Final   Influenza B NOT DETECTED NOT DETECTED Final   Parainfluenza Virus 1 NOT DETECTED NOT DETECTED Final   Parainfluenza Virus 2 NOT DETECTED NOT DETECTED Final   Parainfluenza Virus 3 NOT DETECTED NOT DETECTED Final   Parainfluenza Virus 4 NOT DETECTED NOT DETECTED Final   Respiratory Syncytial Virus NOT DETECTED NOT DETECTED Final   Bordetella pertussis NOT DETECTED NOT DETECTED Final   Bordetella Parapertussis NOT DETECTED NOT DETECTED Final   Chlamydophila pneumoniae NOT DETECTED NOT DETECTED Final   Mycoplasma pneumoniae NOT DETECTED NOT DETECTED Final    Comment: Performed at [facility], [place]. [address], [place], [place] [address]         Radiology Studies: DG Chest Port 1 View  Result Date: 05/28/2022 CLINICAL DATA:  SOB, BIPAP EXAM: PORTABLE  CHEST 1 VIEW COMPARISON:  05/01/2022 FINDINGS: The lungs are mildly hyperinflated, progressive since prior examination with flattening of the hemidiaphragms. The lungs are clear. No pneumothorax or pleural effusion. Cardiac size within normal limits. Pulmonary vascularity is normal. Cervical fusion hardware partially visualized. No acute bone abnormality. IMPRESSION: Mild pulmonary hyperinflation. Electronically Signed   By: Fidela Salisbury M.D.   On: 05/28/2022 00:12        Scheduled Meds:  atorvastatin  40 mg Oral Daily   azithromycin  500 mg Oral Daily   budesonide (PULMICORT) nebulizer solution  0.25 mg Nebulization BID   carbamazepine  400 mg Oral BID   enoxaparin (LOVENOX) injection  40 mg Subcutaneous Q24H   estradiol  0.5 mg Oral Daily   gabapentin  100 mg Oral TID   ipratropium-albuterol   3 mL Nebulization Q6H   irbesartan  75 mg Oral Daily   ketotifen  1 drop Both Eyes BID   loratadine  10 mg Oral QPM   methylPREDNISolone (SOLU-MEDROL) injection  120 mg Intravenous Q24H   montelukast  10 mg Oral QHS   pantoprazole  40 mg Oral Daily   Vitamin D (Ergocalciferol)  50,000 Units Oral Q7 days   Continuous Infusions:   LOS: 1 day    Time spent: 35 minutes.     Elmarie Shiley, MD [facility]   If 7PM-7AM, please contact night-coverage [email or web address]  05/29/2022, 12:56 PM

## 2022-05-30 ENCOUNTER — Ambulatory Visit: Payer: Medicare HMO | Admitting: Emergency Medicine

## 2022-05-30 DIAGNOSIS — R079 Chest pain, unspecified: Secondary | ICD-10-CM

## 2022-05-30 DIAGNOSIS — G40909 Epilepsy, unspecified, not intractable, without status epilepticus: Secondary | ICD-10-CM

## 2022-05-30 DIAGNOSIS — J9601 Acute respiratory failure with hypoxia: Principal | ICD-10-CM

## 2022-05-30 DIAGNOSIS — J9602 Acute respiratory failure with hypercapnia: Secondary | ICD-10-CM

## 2022-05-30 DIAGNOSIS — J441 Chronic obstructive pulmonary disease with (acute) exacerbation: Secondary | ICD-10-CM | POA: Diagnosis not present

## 2022-05-30 DIAGNOSIS — D72829 Elevated white blood cell count, unspecified: Secondary | ICD-10-CM | POA: Diagnosis not present

## 2022-05-30 MED ORDER — POLYETHYLENE GLYCOL 3350 17 G PO PACK
17.0000 g | PACK | Freq: Every day | ORAL | Status: DC | PRN
  Administered 2022-05-30: 17 g via ORAL
  Filled 2022-05-30: qty 1

## 2022-05-30 MED ORDER — DOCUSATE SODIUM 100 MG PO CAPS
100.0000 mg | ORAL_CAPSULE | Freq: Two times a day (BID) | ORAL | Status: DC
  Administered 2022-05-30 – 2022-06-01 (×4): 100 mg via ORAL
  Filled 2022-05-30 (×4): qty 1

## 2022-05-30 MED ORDER — METHYLPREDNISOLONE SODIUM SUCC 125 MG IJ SOLR
80.0000 mg | INTRAMUSCULAR | Status: DC
  Administered 2022-05-31 – 2022-06-01 (×2): 80 mg via INTRAVENOUS
  Filled 2022-05-30 (×2): qty 2

## 2022-05-30 NOTE — Progress Notes (Signed)
[facility] night cross cover note:  I was notified by RN regarding patient's reported constipation, including most recent bowel movement occurring on Sunday, 05/27/2022.  I subsequently placed orders for scheduled Colace 100 mg p.o. twice daily, first dose now, in addition to placing order for as needed MiraLAX.    Babs Bertin, DO Hospitalist

## 2022-05-30 NOTE — Progress Notes (Signed)
PROGRESS NOTE    [name]  GUY:[id number] DOB: 1961/12/02 DOA: 05/27/2022 PCP: Biagio Borg, MD   Brief Narrative:  61 year old with past medical history significant for asthma/COPD overlap syndrome, allergic rhinitis, anxiety depression, GERD, hypertension, PVD, prediabetes, seizure, stroke, recent admission from 04/30/2022-05/02/2022 for acute hypoxic/hypercapnic respiratory failure requiring BiPAP presented with worsening shortness of breath.  On EMS arrival, she was diaphoretic, tripoding.  She was treated with nebs, IV magnesium, IV Solu-Medrol.  On presentation, she required BiPAP.  ABG showed pH of 7.3, PCO2 of 54.  Chest x-ray showed mild pulmonary hyperinflation.  Apparently, she accidentally inhaled the fumes of propane causing worsening shortness of breath.  Assessment & Plan:   Acute exacerbation of asthma/COPD overlap syndrome Acute hypoxic/hypercapnic respiratory failure -Possibly triggered by recent exposure to propane fumes -Required BiPAP on presentation.  Respiratory status improving.  Currently on room air. -Respiratory virus panel negative -Decrease Solu-Medrol to 80 mg IV daily.  Continue current nebs.  Continue montelukast and Zithromax.  Outpatient follow-up with pulmonary  Chest pain; likely secondary to bronchospasm -Resolved.  High-sensitivity troponins did not trend up.  EKG was not ischemic.  No further work-up needed  Leukocytosis -Possibly from steroid use.  Hypertension -Continue irbesartan.  Hydrochlorothiazide on hold  Hyperlipidemia -Continue Lipitor  Seizure disorder -continue Tegretol  Normocytic anemia From questionable cause.  Hemoglobin stable.   DVT prophylaxis: Lovenox Code Status: Full Family Communication: None at bedside Disposition Plan: Status is: Inpatient Remains inpatient appropriate because: Of severity of illness.  Need for IV steroids    Consultants: None  Procedures: None  Antimicrobials: Zithromax  from 05/28/2022 onwards   Subjective: Patient seen and examined at bedside.  Feels slightly better but still short of breath with exertion and wheezing intermittently.  No overnight fever, nausea, vomiting or chest pain reported.  Objective: Vitals:   05/30/22 0756 05/30/22 0757 05/30/22 0759 05/30/22 0805  BP:    126/74  Pulse:    76  Resp:    18  Temp:    98.2 F (36.8 C)  TempSrc:    Oral  SpO2: 96% 97% 97% 97%  Weight:      Height:       No intake or output data in the 24 hours ending 05/30/22 1037 Filed Weights   05/28/22 1840  Weight: 71 kg    Examination:  General exam: Appears calm and comfortable.  Currently on room air. Respiratory system: Bilateral decreased breath sounds at bases with scattered mild wheezing Cardiovascular system: S1 & S2 heard, Rate controlled Gastrointestinal system: Abdomen is nondistended, soft and nontender. Normal bowel sounds heard. Extremities: No cyanosis, clubbing; trace lower extremity edema Central nervous system: Alert and oriented. No focal neurological deficits. Moving extremities Skin: No rashes, lesions or ulcers Psychiatry: Judgement and insight appear normal. Mood & affect appropriate.     Data Reviewed: I have personally reviewed following labs and imaging studies  CBC: Recent Labs  Lab 05/28/22 0022 05/28/22 0040 05/28/22 0512  WBC 14.7*  --  14.9*  NEUTROABS 9.3*  --   --   HGB 10.6* 11.2* 9.9*  HCT 33.3* 33.0* 32.4*  MCV 85.6  --  86.6  PLT 379  --  563   Basic Metabolic Panel: Recent Labs  Lab 05/28/22 0022 05/28/22 0040  NA 140 139  K 3.7 3.6  CL 102  --   CO2 28  --   GLUCOSE 146*  --   BUN 11  --   CREATININE 0.88  --  CALCIUM 8.7*  --    GFR: Estimated Creatinine Clearance: 65 mL/min (by C-G formula based on SCr of 0.88 mg/dL). Liver Function Tests: No results for input(s): "AST", "ALT", "ALKPHOS", "BILITOT", "PROT", "ALBUMIN" in the last 168 hours. No results for input(s): "LIPASE",  "AMYLASE" in the last 168 hours. No results for input(s): "AMMONIA" in the last 168 hours. Coagulation Profile: No results for input(s): "INR", "PROTIME" in the last 168 hours. Cardiac Enzymes: No results for input(s): "CKTOTAL", "CKMB", "CKMBINDEX", "TROPONINI" in the last 168 hours. BNP (last 3 results) No results for input(s): "PROBNP" in the last 8760 hours. HbA1C: No results for input(s): "HGBA1C" in the last 72 hours. CBG: No results for input(s): "GLUCAP" in the last 168 hours. Lipid Profile: No results for input(s): "CHOL", "HDL", "LDLCALC", "TRIG", "CHOLHDL", "LDLDIRECT" in the last 72 hours. Thyroid Function Tests: No results for input(s): "TSH", "T4TOTAL", "FREET4", "T3FREE", "THYROIDAB" in the last 72 hours. Anemia Panel: No results for input(s): "VITAMINB12", "FOLATE", "FERRITIN", "TIBC", "IRON", "RETICCTPCT" in the last 72 hours. Sepsis Labs: No results for input(s): "PROCALCITON", "LATICACIDVEN" in the last 168 hours.  Recent Results (from the past 240 hour(s))  Respiratory (~20 pathogens) panel by PCR     Status: None   Collection Time: 05/28/22  5:12 AM   Specimen: Nasopharyngeal Swab; Respiratory  Result Value Ref Range Status   Adenovirus NOT DETECTED NOT DETECTED Final   Coronavirus 229E NOT DETECTED NOT DETECTED Final    Comment: (NOTE) The Coronavirus on the Respiratory Panel, DOES NOT test for the novel  Coronavirus (2019 nCoV)    Coronavirus HKU1 NOT DETECTED NOT DETECTED Final   Coronavirus NL63 NOT DETECTED NOT DETECTED Final   Coronavirus OC43 NOT DETECTED NOT DETECTED Final   Metapneumovirus NOT DETECTED NOT DETECTED Final   Rhinovirus / Enterovirus NOT DETECTED NOT DETECTED Final   Influenza A NOT DETECTED NOT DETECTED Final   Influenza B NOT DETECTED NOT DETECTED Final   Parainfluenza Virus 1 NOT DETECTED NOT DETECTED Final   Parainfluenza Virus 2 NOT DETECTED NOT DETECTED Final   Parainfluenza Virus 3 NOT DETECTED NOT DETECTED Final    Parainfluenza Virus 4 NOT DETECTED NOT DETECTED Final   Respiratory Syncytial Virus NOT DETECTED NOT DETECTED Final   Bordetella pertussis NOT DETECTED NOT DETECTED Final   Bordetella Parapertussis NOT DETECTED NOT DETECTED Final   Chlamydophila pneumoniae NOT DETECTED NOT DETECTED Final   Mycoplasma pneumoniae NOT DETECTED NOT DETECTED Final    Comment: Performed at [facility], [place]. [address], [place], [place] [address]         Radiology Studies: No results found.      Scheduled Meds:  arformoterol  15 mcg Nebulization BID   atorvastatin  40 mg Oral Daily   azithromycin  500 mg Oral Daily   budesonide (PULMICORT) nebulizer solution  0.25 mg Nebulization BID   carbamazepine  400 mg Oral BID   enoxaparin (LOVENOX) injection  40 mg Subcutaneous Q24H   estradiol  0.5 mg Oral Daily   gabapentin  100 mg Oral TID   ipratropium-albuterol  3 mL Nebulization Q6H   irbesartan  75 mg Oral Daily   ketotifen  1 drop Both Eyes BID   loratadine  10 mg Oral QPM   methylPREDNISolone (SOLU-MEDROL) injection  120 mg Intravenous Q24H   montelukast  10 mg Oral QHS   pantoprazole  40 mg Oral Daily   Vitamin D (Ergocalciferol)  50,000 Units Oral Q7 days   Continuous Infusions:  Aline August, MD [facility] 05/30/2022, 10:37 AM

## 2022-05-31 DIAGNOSIS — D72829 Elevated white blood cell count, unspecified: Secondary | ICD-10-CM | POA: Diagnosis not present

## 2022-05-31 DIAGNOSIS — R079 Chest pain, unspecified: Secondary | ICD-10-CM | POA: Diagnosis not present

## 2022-05-31 DIAGNOSIS — J441 Chronic obstructive pulmonary disease with (acute) exacerbation: Secondary | ICD-10-CM | POA: Diagnosis not present

## 2022-05-31 DIAGNOSIS — J9601 Acute respiratory failure with hypoxia: Secondary | ICD-10-CM | POA: Diagnosis not present

## 2022-05-31 MED ORDER — IPRATROPIUM-ALBUTEROL 0.5-2.5 (3) MG/3ML IN SOLN
3.0000 mL | Freq: Two times a day (BID) | RESPIRATORY_TRACT | Status: DC
  Administered 2022-05-31 – 2022-06-01 (×2): 3 mL via RESPIRATORY_TRACT
  Filled 2022-05-31 (×2): qty 3

## 2022-05-31 MED ORDER — IPRATROPIUM-ALBUTEROL 0.5-2.5 (3) MG/3ML IN SOLN
3.0000 mL | Freq: Three times a day (TID) | RESPIRATORY_TRACT | Status: DC
  Administered 2022-05-31: 3 mL via RESPIRATORY_TRACT
  Filled 2022-05-31: qty 3

## 2022-05-31 NOTE — Progress Notes (Signed)
PROGRESS NOTE    [name]  IPJ:[id number] DOB: 1961/04/03 DOA: 05/27/2022 PCP: Biagio Borg, MD   Brief Narrative:  61 year old with past medical history significant for asthma/COPD overlap syndrome, allergic rhinitis, anxiety depression, GERD, hypertension, PVD, prediabetes, seizure, stroke, recent admission from 04/30/2022-05/02/2022 for acute hypoxic/hypercapnic respiratory failure requiring BiPAP presented with worsening shortness of breath.  On EMS arrival, she was diaphoretic, tripoding.  She was treated with nebs, IV magnesium, IV Solu-Medrol.  On presentation, she required BiPAP.  ABG showed pH of 7.3, PCO2 of 54.  Chest x-ray showed mild pulmonary hyperinflation.  Apparently, she accidentally inhaled the fumes of propane causing worsening shortness of breath.  Assessment & Plan:   Acute exacerbation of asthma/COPD overlap syndrome Acute hypoxic/hypercapnic respiratory failure -Possibly triggered by recent exposure to propane fumes -Required BiPAP on presentation.  Respiratory status improving.  Currently on room air but still has significant wheezing. -Respiratory virus panel negative -Continue Solu-Medrol to 80 mg IV daily.  Continue current nebs.  Continue montelukast and Zithromax.  Outpatient follow-up with pulmonary  Chest pain; likely secondary to bronchospasm -Resolved.  High-sensitivity troponins did not trend up.  EKG was not ischemic.  No further work-up needed  Leukocytosis -Possibly from steroid use.  Hypertension -Continue irbesartan.  Hydrochlorothiazide on hold  Hyperlipidemia -Continue Lipitor  Seizure disorder -continue Tegretol  Normocytic anemia From questionable cause.  Hemoglobin stable.   DVT prophylaxis: Lovenox Code Status: Full Family Communication: None at bedside Disposition Plan: Status is: Inpatient Remains inpatient appropriate because: Of severity of illness.  Need for IV steroids    Consultants:  None  Procedures: None  Antimicrobials: Zithromax from 05/28/2022 onwards   Subjective: Patient seen and examined at bedside.  Still complains of intermittent wheezing and does not feel ready to go home yet.  Denies any current chest pain, nausea, vomiting or fever. Objective: Vitals:   05/30/22 2311 05/31/22 0406 05/31/22 0820 05/31/22 0900  BP: 115/63 (!) 142/75  (!) 144/83  Pulse: 66 71  77  Resp: '18 16  19  '$ Temp: 98 F (36.7 C) 98.2 F (36.8 C)  98.3 F (36.8 C)  TempSrc: Oral Oral  Oral  SpO2: 97% 98% 96% 96%  Weight:      Height:        Intake/Output Summary (Last 24 hours) at 05/31/2022 0958 Last data filed at 05/31/2022 0900 Gross per 24 hour  Intake 960 ml  Output --  Net 960 ml   Filed Weights   05/28/22 1840  Weight: 71 kg    Examination:  General: On room air.  No distress ENT/neck: No thyromegaly.  JVD is not elevated  respiratory: Decreased breath sounds at bases bilaterally with some crackles; diffuse mild wheezing present  CVS: S1-S2 heard, rate controlled currently Abdominal: Soft, nontender, slightly distended; no organomegaly,  bowel sounds are heard Extremities: Trace lower extremity edema; no cyanosis  CNS: Awake and alert.  No focal neurologic deficit.  Moves extremities Lymph: No obvious lymphadenopathy Skin: No obvious ecchymosis/lesions  psych: Affect, judgment and mood are normal  musculoskeletal: No obvious joint swelling/deformity     Data Reviewed: I have personally reviewed following labs and imaging studies  CBC: Recent Labs  Lab 05/28/22 0022 05/28/22 0040 05/28/22 0512  WBC 14.7*  --  14.9*  NEUTROABS 9.3*  --   --   HGB 10.6* 11.2* 9.9*  HCT 33.3* 33.0* 32.4*  MCV 85.6  --  86.6  PLT 379  --  362  Basic Metabolic Panel: Recent Labs  Lab 05/28/22 0022 05/28/22 0040  NA 140 139  K 3.7 3.6  CL 102  --   CO2 28  --   GLUCOSE 146*  --   BUN 11  --   CREATININE 0.88  --   CALCIUM 8.7*  --      GFR: Estimated Creatinine Clearance: 65 mL/min (by C-G formula based on SCr of 0.88 mg/dL). Liver Function Tests: No results for input(s): "AST", "ALT", "ALKPHOS", "BILITOT", "PROT", "ALBUMIN" in the last 168 hours. No results for input(s): "LIPASE", "AMYLASE" in the last 168 hours. No results for input(s): "AMMONIA" in the last 168 hours. Coagulation Profile: No results for input(s): "INR", "PROTIME" in the last 168 hours. Cardiac Enzymes: No results for input(s): "CKTOTAL", "CKMB", "CKMBINDEX", "TROPONINI" in the last 168 hours. BNP (last 3 results) No results for input(s): "PROBNP" in the last 8760 hours. HbA1C: No results for input(s): "HGBA1C" in the last 72 hours. CBG: No results for input(s): "GLUCAP" in the last 168 hours. Lipid Profile: No results for input(s): "CHOL", "HDL", "LDLCALC", "TRIG", "CHOLHDL", "LDLDIRECT" in the last 72 hours. Thyroid Function Tests: No results for input(s): "TSH", "T4TOTAL", "FREET4", "T3FREE", "THYROIDAB" in the last 72 hours. Anemia Panel: No results for input(s): "VITAMINB12", "FOLATE", "FERRITIN", "TIBC", "IRON", "RETICCTPCT" in the last 72 hours. Sepsis Labs: No results for input(s): "PROCALCITON", "LATICACIDVEN" in the last 168 hours.  Recent Results (from the past 240 hour(s))  Respiratory (~20 pathogens) panel by PCR     Status: None   Collection Time: 05/28/22  5:12 AM   Specimen: Nasopharyngeal Swab; Respiratory  Result Value Ref Range Status   Adenovirus NOT DETECTED NOT DETECTED Final   Coronavirus 229E NOT DETECTED NOT DETECTED Final    Comment: (NOTE) The Coronavirus on the Respiratory Panel, DOES NOT test for the novel  Coronavirus (2019 nCoV)    Coronavirus HKU1 NOT DETECTED NOT DETECTED Final   Coronavirus NL63 NOT DETECTED NOT DETECTED Final   Coronavirus OC43 NOT DETECTED NOT DETECTED Final   Metapneumovirus NOT DETECTED NOT DETECTED Final   Rhinovirus / Enterovirus NOT DETECTED NOT DETECTED Final   Influenza A  NOT DETECTED NOT DETECTED Final   Influenza B NOT DETECTED NOT DETECTED Final   Parainfluenza Virus 1 NOT DETECTED NOT DETECTED Final   Parainfluenza Virus 2 NOT DETECTED NOT DETECTED Final   Parainfluenza Virus 3 NOT DETECTED NOT DETECTED Final   Parainfluenza Virus 4 NOT DETECTED NOT DETECTED Final   Respiratory Syncytial Virus NOT DETECTED NOT DETECTED Final   Bordetella pertussis NOT DETECTED NOT DETECTED Final   Bordetella Parapertussis NOT DETECTED NOT DETECTED Final   Chlamydophila pneumoniae NOT DETECTED NOT DETECTED Final   Mycoplasma pneumoniae NOT DETECTED NOT DETECTED Final    Comment: Performed at [facility], [place]. [address] [place], [place] [address]         Radiology Studies: No results found.      Scheduled Meds:  arformoterol  15 mcg Nebulization BID   atorvastatin  40 mg Oral Daily   azithromycin  500 mg Oral Daily   budesonide (PULMICORT) nebulizer solution  0.25 mg Nebulization BID   carbamazepine  400 mg Oral BID   docusate sodium  100 mg Oral BID   enoxaparin (LOVENOX) injection  40 mg Subcutaneous Q24H   estradiol  0.5 mg Oral Daily   gabapentin  100 mg Oral TID   ipratropium-albuterol  3 mL Nebulization BID   irbesartan  75 mg Oral  Daily   ketotifen  1 drop Both Eyes BID   loratadine  10 mg Oral QPM   methylPREDNISolone (SOLU-MEDROL) injection  80 mg Intravenous Q24H   montelukast  10 mg Oral QHS   pantoprazole  40 mg Oral Daily   Vitamin D (Ergocalciferol)  50,000 Units Oral Q7 days   Continuous Infusions:        Aline August, MD [facility] 05/31/2022, 9:58 AM

## 2022-05-31 NOTE — Progress Notes (Signed)
RN provided flutter valve and Incentive spirometer to the pt. RN went over education about these two devices and how to do them properly. Pt verbalized understanding.   Lavenia Atlas, RN

## 2022-05-31 NOTE — Care Management Important Message (Signed)
Important Message  Patient Details  Name: [name] MRN: [id number] Date of Birth: [birth year]   Medicare Important Message Given:  Yes     Shelda Altes 05/31/2022, 7:56 AM

## 2022-06-01 DIAGNOSIS — D72829 Elevated white blood cell count, unspecified: Secondary | ICD-10-CM | POA: Diagnosis not present

## 2022-06-01 DIAGNOSIS — J9601 Acute respiratory failure with hypoxia: Secondary | ICD-10-CM | POA: Diagnosis not present

## 2022-06-01 DIAGNOSIS — R079 Chest pain, unspecified: Secondary | ICD-10-CM | POA: Diagnosis not present

## 2022-06-01 DIAGNOSIS — J441 Chronic obstructive pulmonary disease with (acute) exacerbation: Secondary | ICD-10-CM | POA: Diagnosis not present

## 2022-06-01 MED ORDER — ALBUTEROL SULFATE (2.5 MG/3ML) 0.083% IN NEBU: 2.5000 mg | INHALATION_SOLUTION | Freq: Four times a day (QID) | RESPIRATORY_TRACT | 0 refills | Status: DC | PRN

## 2022-06-01 MED ORDER — PREDNISONE 20 MG PO TABS
40.0000 mg | ORAL_TABLET | Freq: Every day | ORAL | 0 refills | Status: DC
Start: 2022-06-01 — End: 2022-06-17

## 2022-06-01 MED ORDER — VITAMIN D (ERGOCALCIFEROL) 1.25 MG (50000 UNIT) PO CAPS: 50000.0000 [IU] | ORAL_CAPSULE | ORAL | 0 refills | Status: DC

## 2022-06-01 NOTE — Plan of Care (Signed)
  Problem: Education: Goal: Knowledge of General Education information will improve Description Including pain rating scale, medication(s)/side effects and non-pharmacologic comfort measures Outcome: Progressing   

## 2022-06-01 NOTE — Discharge Summary (Signed)
Physician Discharge Summary  [name] WGN:[id number] DOB: November 23, 1961 DOA: 05/27/2022  PCP: Biagio Borg, MD  Admit date: 05/27/2022 Discharge date: 06/01/2022  Admitted From: Home Disposition: Home  Recommendations for Outpatient Follow-up:  Follow up with PCP in 1 week  Follow up in ED if symptoms worsen or new appear   Home Health: No Equipment/Devices: None  Discharge Condition: Stable CODE STATUS: Full Diet recommendation: Heart healthy  Brief/Interim Summary: 61 year old with past medical history significant for asthma/COPD overlap syndrome, allergic rhinitis, anxiety depression, GERD, hypertension, PVD, prediabetes, seizure, stroke, recent admission from 04/30/2022-05/02/2022 for acute hypoxic/hypercapnic respiratory failure requiring BiPAP presented with worsening shortness of breath.  On EMS arrival, she was diaphoretic, tripoding.  She was treated with nebs, IV magnesium, IV Solu-Medrol.  On presentation, she required BiPAP.  ABG showed pH of 7.3, PCO2 of 54.  Chest x-ray showed mild pulmonary hyperinflation.  Apparently, she accidentally inhaled the fumes of propane causing worsening shortness of breath.  She was treated with IV Solu-Medrol.  During the hospitalization, her respiratory status has improved and she is currently on room air.  She will be discharged home today on oral prednisone.  Outpatient follow-up with PCP.  Discharge Diagnoses:   Acute exacerbation of asthma/COPD overlap syndrome Acute hypoxic/hypercapnic respiratory failure -Possibly triggered by recent exposure to propane fumes -Required BiPAP on presentation.  Respiratory status has much improved.  Currently on room air with significant improvement in wheezing. -Respiratory virus panel negative -Treated with Solu-Medrol IV along with nebs/oral Zithromax and montelukast..  -Discharge home on oral prednisone 40 mg daily for 7 days along with oral montelukast and continue inhaled regimen.   Outpatient follow-up with pulmonary  Chest pain; likely secondary to bronchospasm -Resolved.  High-sensitivity troponins did not trend up.  EKG was not ischemic.  No further work-up needed  Leukocytosis -Possibly from steroid use.  Hypertension -Resume home regimen.  Hyperlipidemia -Continue Lipitor  Seizure disorder -continue Tegretol  Normocytic anemia -questionable cause.  Hemoglobin stable.  Discharge Instructions  Discharge Instructions     Diet - low sodium heart healthy   Complete by: As directed    Increase activity slowly   Complete by: As directed       Allergies as of 06/01/2022       Reactions   Belsomra [suvorexant] Other (See Comments)   hallucinations   Sulfa Antibiotics Nausea And Vomiting   Venofer [iron Sucrose] Hives        Medication List     STOP taking these medications    acetaminophen 325 MG tablet Commonly known as: TYLENOL   triamcinolone ointment 0.1 % Commonly known as: KENALOG       TAKE these medications    albuterol 108 (90 Base) MCG/ACT inhaler Commonly known as: VENTOLIN HFA Inhale 2 puffs into the lungs every 4 (four) hours as needed for wheezing or shortness of breath. What changed: Another medication with the same name was changed. Make sure you understand how and when to take each.   albuterol (2.5 MG/3ML) 0.083% nebulizer solution Commonly known as: PROVENTIL Take 3 mLs (2.5 mg total) by nebulization every 6 (six) hours as needed for wheezing or shortness of breath. What changed: additional instructions   atorvastatin 40 MG tablet Commonly known as: LIPITOR Take 1 tablet (40 mg total) by mouth daily. TAKE 1 TABLET(40 MG) BY MOUTH DAILY Strength: 40 mg What changed: additional instructions   Atrovent HFA 17 MCG/ACT inhaler Generic drug: ipratropium Inhale 2 puffs into the lungs every  6 (six) hours as needed for wheezing.   azelastine 0.05 % ophthalmic solution Commonly known as: OPTIVAR Place 1 drop  into both eyes 2 (two) times daily.   Belsomra 15 MG Tabs Generic drug: Suvorexant Take 1 tablet by mouth at bedtime as needed (sleep).   Biotin 1000 MCG tablet Take 1,000 mcg by mouth daily.   Breztri Aerosphere 160-9-4.8 MCG/ACT Aero Generic drug: Budeson-Glycopyrrol-Formoterol Inhale 2 puffs into the lungs in the morning and at bedtime.   carbamazepine 200 MG tablet Commonly known as: TEGretol Take 2 tablets (400 mg total) by mouth 2 (two) times daily.   cetirizine 10 MG tablet Commonly known as: ZYRTEC Can take one tablet by mouth once daily if needed for runny nose. What changed:  how much to take how to take this when to take this reasons to take this additional instructions   cyclobenzaprine 5 MG tablet Commonly known as: FLEXERIL TAKE 1 TABLET(5 MG) BY MOUTH THREE TIMES DAILY AS NEEDED FOR MUSCLE SPASMS What changed:  how much to take how to take this when to take this reasons to take this additional instructions   diclofenac Sodium 1 % Gel Commonly known as: VOLTAREN Apply 4 g topically 4 (four) times daily. What changed:  when to take this reasons to take this   Dupixent 300 MG/2ML prefilled syringe Generic drug: dupilumab Inject 300 mg into the skin every 28 (twenty-eight) days.   estradiol 0.5 MG tablet Commonly known as: ESTRACE Take 1 tablet (0.5 mg total) by mouth daily.   famotidine 20 MG tablet Commonly known as: PEPCID Take 20 mg by mouth daily as needed for heartburn or indigestion. OTC   fluticasone 50 MCG/ACT nasal spray Commonly known as: FLONASE Place 1 spray into both nostrils 2 (two) times daily as needed for allergies or rhinitis.   gabapentin 100 MG capsule Commonly known as: NEURONTIN Take 1 capsule (100 mg total) by mouth 3 (three) times daily.   hydrochlorothiazide 12.5 MG capsule Commonly known as: MICROZIDE Take 1 capsule (12.5 mg total) by mouth daily. TAKE 1 CAPSULE(12.5 MG) BY MOUTH DAILY Strength: 12.5 mg What  changed: additional instructions   hydrOXYzine 25 MG capsule Commonly known as: VISTARIL Take 1 when angry and if not calmer in 1 hour repeat What changed:  how much to take how to take this when to take this additional instructions   irbesartan 75 MG tablet Commonly known as: AVAPRO TAKE 1 TABLET(75 MG) BY MOUTH DAILY What changed: See the new instructions.   levocetirizine 5 MG tablet Commonly known as: XYZAL Take 1 tablet (5 mg total) by mouth every evening.   montelukast 10 MG tablet Commonly known as: SINGULAIR TAKE 1 TABLET(10 MG) BY MOUTH AT BEDTIME What changed: See the new instructions.   ondansetron 4 MG disintegrating tablet Commonly known as: Zofran ODT Take 1 tablet (4 mg total) by mouth every 8 (eight) hours as needed for nausea or vomiting.   pantoprazole 40 MG tablet Commonly known as: PROTONIX TAKE 1 TABLET BY MOUTH EVERY DAY What changed:  how much to take how to take this when to take this additional instructions   potassium chloride 10 MEQ tablet Commonly known as: KLOR-CON M Take 1 tablet (10 mEq total) by mouth daily.   predniSONE 20 MG tablet Commonly known as: DELTASONE Take 2 tablets (40 mg total) by mouth daily with breakfast. What changed:  medication strength how much to take   traZODone 100 MG tablet Commonly known as: DESYREL Take  1 tablet (100 mg total) by mouth at bedtime as needed for sleep.   UNKNOWN TO PATIENT allergy shots  every 2 weeks   Vitamin D (Ergocalciferol) 1.25 MG (50000 UNIT) Caps capsule Commonly known as: DRISDOL Take 1 capsule (50,000 Units total) by mouth every 7 (seven) days. Start taking on: June 05, 2022        Follow-up Information     Biagio Borg, MD. Schedule an appointment as soon as possible for a visit in 1 week(s).   Specialties: Internal Medicine, Radiology Contact information: [place] [address] [phone]                Allergies  Allergen  Reactions   Belsomra [Suvorexant] Other (See Comments)    hallucinations   Sulfa Antibiotics Nausea And Vomiting   Venofer [Iron Sucrose] Hives    Consultations: None   Procedures/Studies: DG Chest Port 1 View  Result Date: 05/28/2022 CLINICAL DATA:  SOB, BIPAP EXAM: PORTABLE CHEST 1 VIEW COMPARISON:  05/01/2022 FINDINGS: The lungs are mildly hyperinflated, progressive since prior examination with flattening of the hemidiaphragms. The lungs are clear. No pneumothorax or pleural effusion. Cardiac size within normal limits. Pulmonary vascularity is normal. Cervical fusion hardware partially visualized. No acute bone abnormality. IMPRESSION: Mild pulmonary hyperinflation. Electronically Signed   By: Fidela Salisbury M.D.   On: 05/28/2022 00:12   DG Knee AP/LAT W/Sunrise Right  Result Date: 05/17/2022 CLINICAL DATA:  Right knee pain and swelling for 1 week after fall. EXAM: RIGHT KNEE 3 VIEWS COMPARISON:  None Available. FINDINGS: Mild medial compartment joint space narrowing. Minimal enthesopathic spurring at the quadriceps insertion on the patella. No joint effusion. No acute fracture or dislocation. IMPRESSION: Mild medial compartment joint space narrowing. Electronically Signed   By: Yvonne Kendall M.D.   On: 05/17/2022 11:52      Subjective: Patient seen and examined at bedside.  Feels much better and feels okay to go home today.  No overnight fever, nausea, vomiting reported.  Discharge Exam: Vitals:   06/01/22 0837 06/01/22 0844  BP:    Pulse:    Resp:    Temp:    SpO2: 98% 98%    General: Pt is alert, awake, not in acute distress.  Currently on room air. Cardiovascular: rate controlled, S1/S2 + Respiratory: bilateral decreased breath sounds at bases with very minimal wheezing Abdominal: Soft, NT, ND, bowel sounds + Extremities: no edema, no cyanosis    The results of significant diagnostics from this hospitalization (including imaging, microbiology, ancillary and  laboratory) are listed below for reference.     Microbiology: Recent Results (from the past 240 hour(s))  Respiratory (~20 pathogens) panel by PCR     Status: None   Collection Time: 05/28/22  5:12 AM   Specimen: Nasopharyngeal Swab; Respiratory  Result Value Ref Range Status   Adenovirus NOT DETECTED NOT DETECTED Final   Coronavirus 229E NOT DETECTED NOT DETECTED Final    Comment: (NOTE) The Coronavirus on the Respiratory Panel, DOES NOT test for the novel  Coronavirus (2019 nCoV)    Coronavirus HKU1 NOT DETECTED NOT DETECTED Final   Coronavirus NL63 NOT DETECTED NOT DETECTED Final   Coronavirus OC43 NOT DETECTED NOT DETECTED Final   Metapneumovirus NOT DETECTED NOT DETECTED Final   Rhinovirus / Enterovirus NOT DETECTED NOT DETECTED Final   Influenza A NOT DETECTED NOT DETECTED Final   Influenza B NOT DETECTED NOT DETECTED Final   Parainfluenza Virus 1 NOT DETECTED NOT DETECTED  Final   Parainfluenza Virus 2 NOT DETECTED NOT DETECTED Final   Parainfluenza Virus 3 NOT DETECTED NOT DETECTED Final   Parainfluenza Virus 4 NOT DETECTED NOT DETECTED Final   Respiratory Syncytial Virus NOT DETECTED NOT DETECTED Final   Bordetella pertussis NOT DETECTED NOT DETECTED Final   Bordetella Parapertussis NOT DETECTED NOT DETECTED Final   Chlamydophila pneumoniae NOT DETECTED NOT DETECTED Final   Mycoplasma pneumoniae NOT DETECTED NOT DETECTED Final    Comment: Performed at [facility], [place] [address] [place], [place] [address]     Labs: BNP (last 3 results) Recent Labs    02/06/22 0517  BNP 57.3   Basic Metabolic Panel: Recent Labs  Lab 05/28/22 0022 05/28/22 0040  NA 140 139  K 3.7 3.6  CL 102  --   CO2 28  --   GLUCOSE 146*  --   BUN 11  --   CREATININE 0.88  --   CALCIUM 8.7*  --    Liver Function Tests: No results for input(s): "AST", "ALT", "ALKPHOS", "BILITOT", "PROT", "ALBUMIN" in the last 168 hours. No results for input(s): "LIPASE", "AMYLASE" in the last  168 hours. No results for input(s): "AMMONIA" in the last 168 hours. CBC: Recent Labs  Lab 05/28/22 0022 05/28/22 0040 05/28/22 0512  WBC 14.7*  --  14.9*  NEUTROABS 9.3*  --   --   HGB 10.6* 11.2* 9.9*  HCT 33.3* 33.0* 32.4*  MCV 85.6  --  86.6  PLT 379  --  362   Cardiac Enzymes: No results for input(s): "CKTOTAL", "CKMB", "CKMBINDEX", "TROPONINI" in the last 168 hours. BNP: Invalid input(s): "POCBNP" CBG: No results for input(s): "GLUCAP" in the last 168 hours. D-Dimer No results for input(s): "DDIMER" in the last 72 hours. Hgb A1c No results for input(s): "HGBA1C" in the last 72 hours. Lipid Profile No results for input(s): "CHOL", "HDL", "LDLCALC", "TRIG", "CHOLHDL", "LDLDIRECT" in the last 72 hours. Thyroid function studies No results for input(s): "TSH", "T4TOTAL", "T3FREE", "THYROIDAB" in the last 72 hours.  Invalid input(s): "FREET3" Anemia work up No results for input(s): "VITAMINB12", "FOLATE", "FERRITIN", "TIBC", "IRON", "RETICCTPCT" in the last 72 hours. Urinalysis    Component Value Date/Time   COLORURINE YELLOW 05/18/2022 1603   APPEARANCEUR CLEAR 05/18/2022 1603   LABSPEC 1.015 05/18/2022 1603   PHURINE 6.0 05/18/2022 1603   GLUCOSEU NEGATIVE 05/18/2022 1603   HGBUR NEGATIVE 05/18/2022 1603   BILIRUBINUR NEGATIVE 05/18/2022 1603   KETONESUR NEGATIVE 05/18/2022 1603   PROTEINUR NEGATIVE 05/05/2021 1922   UROBILINOGEN 0.2 05/18/2022 1603   NITRITE NEGATIVE 05/18/2022 1603   LEUKOCYTESUR NEGATIVE 05/18/2022 1603   Sepsis Labs Recent Labs  Lab 05/28/22 0022 05/28/22 0512  WBC 14.7* 14.9*   Microbiology Recent Results (from the past 240 hour(s))  Respiratory (~20 pathogens) panel by PCR     Status: None   Collection Time: 05/28/22  5:12 AM   Specimen: Nasopharyngeal Swab; Respiratory  Result Value Ref Range Status   Adenovirus NOT DETECTED NOT DETECTED Final   Coronavirus 229E NOT DETECTED NOT DETECTED Final    Comment: (NOTE) The  Coronavirus on the Respiratory Panel, DOES NOT test for the novel  Coronavirus (2019 nCoV)    Coronavirus HKU1 NOT DETECTED NOT DETECTED Final   Coronavirus NL63 NOT DETECTED NOT DETECTED Final   Coronavirus OC43 NOT DETECTED NOT DETECTED Final   Metapneumovirus NOT DETECTED NOT DETECTED Final   Rhinovirus / Enterovirus NOT DETECTED NOT DETECTED Final   Influenza A  NOT DETECTED NOT DETECTED Final   Influenza B NOT DETECTED NOT DETECTED Final   Parainfluenza Virus 1 NOT DETECTED NOT DETECTED Final   Parainfluenza Virus 2 NOT DETECTED NOT DETECTED Final   Parainfluenza Virus 3 NOT DETECTED NOT DETECTED Final   Parainfluenza Virus 4 NOT DETECTED NOT DETECTED Final   Respiratory Syncytial Virus NOT DETECTED NOT DETECTED Final   Bordetella pertussis NOT DETECTED NOT DETECTED Final   Bordetella Parapertussis NOT DETECTED NOT DETECTED Final   Chlamydophila pneumoniae NOT DETECTED NOT DETECTED Final   Mycoplasma pneumoniae NOT DETECTED NOT DETECTED Final    Comment: Performed at [facility], [place] [address], [place], [place] [address]     Time coordinating discharge: 35 minutes  SIGNED:   Aline August, MD  [facility] 06/01/2022, 11:22 AM

## 2022-06-04 ENCOUNTER — Ambulatory Visit: Payer: Medicare HMO

## 2022-06-04 ENCOUNTER — Telehealth: Payer: Self-pay

## 2022-06-04 NOTE — Telephone Encounter (Signed)
Transition Care Management Follow-up Telephone Call Date of discharge and from where: [place] 06-01-22 Dx: COPD exac  How have you been since you were released from the hospital? Doing ok  Any questions or concerns? No  Items Reviewed: Did the pt receive and understand the discharge instructions provided? Yes  Medications obtained and verified? Yes  Other? No  Any new allergies since your discharge? No  Dietary orders reviewed? Yes Do you have support at home? Yes   Home Care and Equipment/Supplies: Were home health services ordered? no If so, what is the name of the agency? na  Has the agency set up a time to come to the patient's home? not applicable Were any new equipment or medical supplies ordered?  No What is the name of the medical supply agency? na Were you able to get the supplies/equipment? not applicable Do you have any questions related to the use of the equipment or supplies? No  Functional Questionnaire: (I = Independent and D = Dependent) ADLs: I  Bathing/Dressing- I  Meal Prep- I  Eating- I  Maintaining continence- I  Transferring/Ambulation- I  Managing Meds- I  Follow up appointments reviewed:  PCP Hospital f/u appt confirmed? Yes  Scheduled to see Dr Jenny Reichmann on 06-12-22 @ [facility] f/u appt confirmed? Yes  Scheduled to see Dr Belenda Cruise on 06-07-22 @ noon . Are transportation arrangements needed? No  If their condition worsens, is the pt aware to call PCP or go to the Emergency Dept.? Yes Was the patient provided with contact information for the PCP's office or ED? Yes Was to pt encouraged to call back with questions or concerns? Yes

## 2022-06-05 ENCOUNTER — Telehealth: Payer: Self-pay | Admitting: Internal Medicine

## 2022-06-05 NOTE — Telephone Encounter (Signed)
Katherin with [facility] calls today to let us know that PT is back home from the hospital.  Jacelyn Grip also would like to get verbal orders in for the patient with frequencies of  Once a week for six weeks  These orders can be given verbally at   CB:[phone]  VM is secure

## 2022-06-05 NOTE — Telephone Encounter (Signed)
Verbals given on VM

## 2022-06-05 NOTE — Telephone Encounter (Signed)
Ok for verbals 

## 2022-06-06 ENCOUNTER — Ambulatory Visit (INDEPENDENT_AMBULATORY_CARE_PROVIDER_SITE_OTHER): Payer: Medicare HMO

## 2022-06-06 DIAGNOSIS — J4550 Severe persistent asthma, uncomplicated: Secondary | ICD-10-CM

## 2022-06-06 MED ORDER — DUPILUMAB 300 MG/2ML ~~LOC~~ SOSY
300.0000 mg | PREFILLED_SYRINGE | SUBCUTANEOUS | Status: DC
  Administered 2022-06-06 – 2023-12-24 (×33): 300 mg via SUBCUTANEOUS

## 2022-06-07 ENCOUNTER — Encounter: Payer: Self-pay | Admitting: Nurse Practitioner

## 2022-06-07 ENCOUNTER — Ambulatory Visit (INDEPENDENT_AMBULATORY_CARE_PROVIDER_SITE_OTHER): Payer: Medicare HMO | Admitting: Nurse Practitioner

## 2022-06-07 VITALS — BP 148/86 | HR 85 | Temp 98.8°F | Ht 63.0 in | Wt 158.2 lb

## 2022-06-07 DIAGNOSIS — J9602 Acute respiratory failure with hypercapnia: Secondary | ICD-10-CM | POA: Diagnosis not present

## 2022-06-07 DIAGNOSIS — H5203 Hypermetropia, bilateral: Secondary | ICD-10-CM | POA: Diagnosis not present

## 2022-06-07 DIAGNOSIS — R0600 Dyspnea, unspecified: Secondary | ICD-10-CM

## 2022-06-07 DIAGNOSIS — J9601 Acute respiratory failure with hypoxia: Secondary | ICD-10-CM | POA: Diagnosis not present

## 2022-06-07 DIAGNOSIS — J449 Chronic obstructive pulmonary disease, unspecified: Secondary | ICD-10-CM | POA: Diagnosis not present

## 2022-06-07 DIAGNOSIS — J309 Allergic rhinitis, unspecified: Secondary | ICD-10-CM | POA: Diagnosis not present

## 2022-06-07 MED ORDER — PREDNISONE 10 MG PO TABS: ORAL_TABLET | ORAL | 0 refills | Status: DC

## 2022-06-07 NOTE — Assessment & Plan Note (Signed)
Episodes of PND and occasional gasping at night; witness in the hospital. She was put on BiPAP during each hospital stay. Concern for CO2 retention from COPD; unable to rule out sleep apnea. I am concerned there may be underlying OSA given her symptoms. Check outpatient ABG; if less than 50, will order sleep study for further evaluation.

## 2022-06-07 NOTE — Patient Instructions (Addendum)
Continue Breztri 2 puffs twice daily.  Brush tongue and rinse mouth afterwards Continue Albuterol inhaler 2 puffs or 3 mL neb every 6 hours as needed for shortness of breath or wheezing. Notify if symptoms persist despite rescue inhaler/neb use. Continue Zyrtec 10 mg daily for allergies Continue Xyzal 1 tab daily for allergies Continue Singulair 10 mg nightly Continue Protonix 40 mg daily Continue Flonase nasal spray 1 spray each nostril twice daily as needed for allergies/runny nose/nasal congestion Continue Dupixent injections as scheduled.  Continue prednisone 40 mg for next 2 days, then 3 tabs for 2 days, 2 tabs for 2 days, then 1 tab for 2 days, then stop  Arterial Blood Gas - someone will contact you for scheduling  Follow up in one month with Dr. Lamonte Sakai or Alanson Aly. If symptoms do not improve or worsen, please contact office for sooner follow up or seek emergency care.

## 2022-06-07 NOTE — Progress Notes (Signed)
$'@Patient'q$  ID: [name], female    DOB: [birth year], 61 y.o.   MRN: [id number]  Chief Complaint  Patient presents with   Follow-up    Referring provider: Biagio Borg, MD  HPI: 61 year old female, former smoker followed for COPD asthma overlap.  She is a patient Dr. Agustina Caroli and last seen in office 05/10/2022.  Past medical history significant for allergic rhinitis, anxiety, depression, GERD, hypertension, PVD, prediabetes, seizure, history of stroke.  She was recently admitted from 04/30/2022 to 05/02/2022 for acute hypoxic/hypercapnic respiratory failure requiring BiPAP.  She was treated with steroids and discharged on taper with plans to remain on 10 mg prednisone until seen outpatient.  Concern for possible sleep apnea given nocturnal dyspnea and hypoxia during her hospital stay and recommended follow-up with pulmonary outpatient for further evaluation.  She was discharged to SNF.  She saw Dr. Lamonte Sakai after discharge on 05/10/2022.  She was overall stable, still on prednisone and instructed to taper off.  Continue triple therapy with Breztri.  Continued Dupixent as previously scheduled.  Continued Singulair, Xyzal and Zyrtec for trigger prevention; allergy shots as directed by Dr. Ernst Bowler.  She was then readmitted to the hospital on 05/27/2022 for AECOPD/asthma and acute hypoxic/hypercapnic respiratory failure, again requiring BiPAP therapy.  RVP was negative.  She did report some chest pain; however ACS work-up was negative and this was suspected to be related to bronchospasm.  Treated with IV steroids and azithromycin.  She was able to be transition to room air.  She was discharged on prednisone 40 mg daily for 7 days.   TEST/EVENTS:  06/10/2020 echocardiogram: EF 84-13%; diastolic parameters normal. RV size and function nl. Unable to measure PASP. Trivial MR.  03/08/2022 CT chest wo contrast: atherosclerosis. No LAD. Emphysematous disease is noted b/l. No suspicious nodules  mentioned.  05/28/2022 CXR 1 view: lungs are mildly hyperinflated, progressive since previous. Lungs are clear with no acute process.   06/07/2022: Today - follow up Patient presents today for hospital follow up. She has had two recent hospitalizations recently related to acute hypoxic and hypercapnic respiratory failure requiring BiPAP. She was not discharged on NIV or BiPAP. There was some concern for possible sleep apnea. Today, she reports that her breathing has been doing well since discharge. She did have an episode Tuesday where she got a little short of breath after walking around people who were smoking. Recovered quickly with albuterol and did not have any further problems. She does feel like she wakes up gasping at night sometimes. She has not been told she snores. She does have some daytime fatigue symptoms as well. She denies morning headaches or drowsy driving. She denies any cough, wheezing, hemoptysis, weight loss, anorexia, leg swelling. She does still have some left sided upper back pain, which is better since her hospital stay. She continues on Agricultural engineer. She continues on Zyrtec, Xyzal and flonase. She gets dupixent injections as scheduled. She is currently on prednisone, has two more days of 40 mg.   Allergies  Allergen Reactions   Belsomra [Suvorexant] Other (See Comments)    hallucinations   Sulfa Antibiotics Nausea And Vomiting   Venofer [Iron Sucrose] Hives    Immunization History  Administered Date(s) Administered   Influenza Split 10/23/2011   Influenza Whole 09/16/2012   Influenza,inj,Quad PF,6+ Mos 10/18/2014, 08/31/2015, 09/25/2016, 10/09/2017, 09/23/2018, 08/12/2019, 10/06/2020, 11/24/2021   PFIZER(Purple Top)SARS-COV-2 Vaccination 02/24/2020, 03/16/2020, 10/29/2020   PNEUMOCOCCAL CONJUGATE-20 06/15/2021   Pneumococcal Polysaccharide-23 12/02/2015   Td 04/22/2014  Past Medical History:  Diagnosis Date   Anemia, iron deficiency 12/22/2014   pt.  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      Bipolar disorder ([place])    COPD (chronic obstructive pulmonary disease) (HCC)    Depression    Eczema 05/18/2022   Fibromyalgia 05/14/2014   GERD (gastroesophageal reflux disease)    History of kidney stones    Hyperlipidemia 04/20/2017   HYPERTENSION 05/12/2009   Qualifier: Diagnosis of  By: Lenn Cal Deborra Medina), [name]     Kidney stones    Peripheral vascular disease ([place])    Pneumonia    Prediabetes 02/23/2014   pt. denies   Seizure ([place])    Stroke ([place]) 11/2020   Urticaria     Tobacco History: Social History   Tobacco Use  Smoking Status Former   Packs/day: 0.25   Years: 36.00   Total pack years: 9.00   Types: Cigarettes   Quit date: 01/21/2019   Years since quitting: 3.3  Smokeless Tobacco Never  Tobacco Comments   Passive smoker.  Patient states her quit date was 05/09/2018.  Patient educated with resources at today's appointment to continue to support her to stop smoking.   Counseling given: Not Answered Tobacco comments: Passive smoker.  Patient states her quit date was 05/09/2018.  Patient educated with resources at today's appointment to continue to support her to stop smoking.   Outpatient Medications Prior to Visit  Medication Sig Dispense Refill   albuterol (PROVENTIL) (2.5 MG/3ML) 0.083% nebulizer solution Take 3 mLs (2.5 mg total) by nebulization every 6 (six) hours as needed for wheezing or shortness of breath. 120 mL 0   albuterol (VENTOLIN HFA) 108 (90 Base) MCG/ACT inhaler Inhale 2 puffs into the lungs every 4 (four) hours as needed for wheezing or shortness of breath. 18 g 11   atorvastatin (LIPITOR) 40 MG tablet Take 1 tablet (40 mg total) by mouth daily. TAKE 1 TABLET(40 MG) BY MOUTH DAILY Strength: 40 mg (Patient  taking differently: Take 40 mg by mouth daily.) 90 tablet 3   azelastine (OPTIVAR) 0.05 % ophthalmic solution Place 1 drop into both eyes 2 (two) times daily. 6 mL 12   BELSOMRA 15 MG TABS Take 1 tablet by mouth at bedtime as needed (sleep).     Biotin 1000 MCG tablet Take 1,000 mcg by mouth daily.     Budeson-Glycopyrrol-Formoterol (BREZTRI AEROSPHERE) 160-9-4.8 MCG/ACT AERO Inhale 2 puffs into the lungs in the morning and at bedtime. 5.9 g 6   carbamazepine (TEGRETOL) 200 MG tablet Take 2 tablets (400 mg total) by mouth 2 (two) times daily. 120 tablet 12   cetirizine (ZYRTEC) 10 MG tablet Can take one tablet by mouth once daily if needed for runny nose. (Patient taking differently: Take 10 mg by mouth daily as needed for rhinitis.) 30 tablet 5   cyclobenzaprine (FLEXERIL) 5 MG tablet TAKE 1 TABLET(5 MG) BY MOUTH THREE TIMES DAILY AS NEEDED FOR MUSCLE SPASMS (Patient taking differently: Take 5 mg by mouth 3 (three) times daily as needed for muscle spasms.) 60 tablet 5   diclofenac Sodium (VOLTAREN) 1 % GEL Apply 4 g topically 4 (four) times daily. (Patient taking differently: Apply 4 g topically 4 (four) times daily as needed (pain).) 100 g 0   DUPIXENT  300 MG/2ML prefilled syringe Inject 300 mg into the skin every 28 (twenty-eight) days.     estradiol (ESTRACE) 0.5 MG tablet Take 1 tablet (0.5 mg total) by mouth daily. 90 tablet 4   famotidine (PEPCID) 20 MG tablet Take 20 mg by mouth daily as needed for heartburn or indigestion. OTC     fluticasone (FLONASE) 50 MCG/ACT nasal spray Place 1 spray into both nostrils 2 (two) times daily as needed for allergies or rhinitis. 16 g 5   gabapentin (NEURONTIN) 100 MG capsule Take 1 capsule (100 mg total) by mouth 3 (three) times daily. 90 capsule 3   hydrochlorothiazide (MICROZIDE) 12.5 MG capsule Take 1 capsule (12.5 mg total) by mouth daily. TAKE 1 CAPSULE(12.5 MG) BY MOUTH DAILY Strength: 12.5 mg (Patient taking differently: Take 12.5 mg by mouth daily.)  90 capsule 3   hydrOXYzine (VISTARIL) 25 MG capsule Take 1 when angry and if not calmer in 1 hour repeat (Patient taking differently: Take 25 mg by mouth See admin instructions. Take 1 capsule when becoming angry and repeat in 1 hour if not calmer.) 45 capsule 6   ipratropium (ATROVENT HFA) 17 MCG/ACT inhaler Inhale 2 puffs into the lungs every 6 (six) hours as needed for wheezing. 1 each 12   irbesartan (AVAPRO) 75 MG tablet TAKE 1 TABLET(75 MG) BY MOUTH DAILY (Patient taking differently: Take 75 mg by mouth daily.) 90 tablet 1   levocetirizine (XYZAL) 5 MG tablet Take 1 tablet (5 mg total) by mouth every evening. 30 tablet 5   montelukast (SINGULAIR) 10 MG tablet TAKE 1 TABLET(10 MG) BY MOUTH AT BEDTIME (Patient taking differently: Take 10 mg by mouth at bedtime.) 30 tablet 0   ondansetron (ZOFRAN ODT) 4 MG disintegrating tablet Take 1 tablet (4 mg total) by mouth every 8 (eight) hours as needed for nausea or vomiting. 20 tablet 0   pantoprazole (PROTONIX) 40 MG tablet TAKE 1 TABLET BY MOUTH EVERY DAY (Patient taking differently: Take 40 mg by mouth daily.) 90 tablet 1   potassium chloride (KLOR-CON M) 10 MEQ tablet Take 1 tablet (10 mEq total) by mouth daily. 90 tablet 3   predniSONE (DELTASONE) 20 MG tablet Take 2 tablets (40 mg total) by mouth daily with breakfast. 14 tablet 0   traZODone (DESYREL) 100 MG tablet Take 1 tablet (100 mg total) by mouth at bedtime as needed for sleep. 90 tablet 1   UNKNOWN TO PATIENT allergy shots  every 2 weeks     Vitamin D, Ergocalciferol, (DRISDOL) 1.25 MG (50000 UNIT) CAPS capsule Take 1 capsule (50,000 Units total) by mouth every 7 (seven) days. 5 capsule 0   Facility-Administered Medications Prior to Visit  Medication Dose Route Frequency Provider Last Rate Last Admin   dupilumab (DUPIXENT) prefilled syringe 300 mg  300 mg Subcutaneous Q14 Days Valentina Shaggy, MD   300 mg at 06/06/22 1452     Review of Systems:   Constitutional: No weight loss or  gain, night sweats, fevers, chills, or lassitude. +fatigue HEENT: No headaches, difficulty swallowing, tooth/dental problems, or sore throat. No sneezing, itching, ear ache, nasal congestion, or post nasal drip CV:  +occasional PND. No chest pain, orthopnea, swelling in lower extremities, anasarca, dizziness, palpitations, syncope Resp: +shortness of breath with exertion (improved). No excess mucus or change in color of mucus. No productive or non-productive. No hemoptysis. No wheezing.  No chest wall deformity GI:  No heartburn, indigestion, abdominal pain, nausea, vomiting, diarrhea, change in bowel habits, loss of  appetite, bloody stools.  Skin: No rash, lesions, ulcerations MSK:  No joint pain or swelling.  No decreased range of motion.  +left back pain (improving) Neuro: No dizziness or lightheadedness.  Psych: No depression or anxiety. Mood stable.     Physical Exam:  BP (!) 148/86 (BP Location: [name] Arm, Patient Position: Sitting, Cuff Size: Normal)   Pulse 85   Temp 98.8 F (37.1 C) (Oral)   Ht '5\' 3"'$  (1.6 m)   Wt 158 lb 3.2 oz (71.8 kg)   SpO2 97%   BMI 28.02 kg/m   GEN: Pleasant, interactive, well-appearing; in no acute distress. HEENT:  Normocephalic and atraumatic. PERRLA. Sclera white. Nasal turbinates pink, moist and patent bilaterally. No rhinorrhea present. Oropharynx pink and moist, without exudate or edema. No lesions, ulcerations, or postnasal drip.  NECK:  Supple w/ fair ROM. No JVD present. Normal carotid impulses w/o bruits. Thyroid symmetrical with no goiter or nodules palpated. No lymphadenopathy.   CV: RRR, no m/r/g, no peripheral edema. Pulses intact, +2 bilaterally. No cyanosis, pallor or clubbing. PULMONARY:  Unlabored, regular breathing. Clear bilaterally A&P w/o wheezes/rales/rhonchi. No accessory muscle use. No dullness to percussion. GI: BS present and normoactive. Soft, non-tender to palpation. No organomegaly or masses detected. No CVA tenderness. MSK:  No erythema, warmth or tenderness. Cap refil <2 sec all extrem. No deformities or joint swelling noted.  Neuro: A/Ox3. No focal deficits noted.   Skin: Warm, no lesions or rashe Psych: Normal affect and behavior. Judgement and thought content appropriate.     Lab Results:  CBC    Component Value Date/Time   WBC 14.9 (H) 05/28/2022 0512   RBC 3.74 (L) 05/28/2022 0512   HGB 9.9 (L) 05/28/2022 0512   HGB 10.2 (L) 08/18/2020 0919   HCT 32.4 (L) 05/28/2022 0512   HCT 32.7 (L) 08/18/2020 0919   PLT 362 05/28/2022 0512   PLT 356 07/31/2019 1151   MCV 86.6 05/28/2022 0512   MCV 88 08/18/2020 0919   MCH 26.5 05/28/2022 0512   MCHC 30.6 05/28/2022 0512   RDW 15.7 (H) 05/28/2022 0512   RDW 14.4 08/18/2020 0919   LYMPHSABS 4.0 05/28/2022 0022   LYMPHSABS 4.7 (H) 08/18/2020 0919   MONOABS 1.2 (H) 05/28/2022 0022   EOSABS 0.1 05/28/2022 0022   EOSABS 0.1 08/18/2020 0919   BASOSABS 0.0 05/28/2022 0022   BASOSABS 0.0 08/18/2020 0919    BMET    Component Value Date/Time   NA 139 05/28/2022 0040   NA 144 07/31/2019 1151   K 3.6 05/28/2022 0040   CL 102 05/28/2022 0022   CO2 28 05/28/2022 0022   GLUCOSE 146 (H) 05/28/2022 0022   BUN 11 05/28/2022 0022   BUN 11 07/31/2019 1151   CREATININE 0.88 05/28/2022 0022   CREATININE 0.71 02/23/2014 1054   CALCIUM 8.7 (L) 05/28/2022 0022   GFRNONAA >60 05/28/2022 0022   GFRNONAA >89 02/23/2014 1054   GFRAA >60 07/19/2020 1113   GFRAA >89 02/23/2014 1054    BNP    Component Value Date/Time   BNP 13.7 02/06/2022 0517     Imaging:  DG Chest Port 1 View  Result Date: 05/28/2022 CLINICAL DATA:  SOB, BIPAP EXAM: PORTABLE CHEST 1 VIEW COMPARISON:  05/01/2022 FINDINGS: The lungs are mildly hyperinflated, progressive since prior examination with flattening of the hemidiaphragms. The lungs are clear. No pneumothorax or pleural effusion. Cardiac size within normal limits. Pulmonary vascularity is normal. Cervical fusion hardware partially  visualized. No acute bone abnormality. IMPRESSION:  Mild pulmonary hyperinflation. Electronically Signed   By: Fidela Salisbury M.D.   On: 05/28/2022 00:12   DG Knee AP/LAT W/Sunrise [name]  Result Date: 05/17/2022 CLINICAL DATA:  [name] knee pain and swelling for 1 week after fall. EXAM: [name] KNEE 3 VIEWS COMPARISON:  None Available. FINDINGS: Mild medial compartment joint space narrowing. Minimal enthesopathic spurring at the quadriceps insertion on the patella. No joint effusion. No acute fracture or dislocation. IMPRESSION: Mild medial compartment joint space narrowing. Electronically Signed   By: Yvonne Kendall M.D.   On: 05/17/2022 11:52    dupilumab (DUPIXENT) prefilled syringe 300 mg     Date Action Dose Route User   Discharged on 06/01/2022   Admitted on 05/27/2022   05/23/2022 1212 Given 300 mg Subcutaneous (Left Arm) Larence Penning, CMA   05/09/2022 1337 Given 300 mg Subcutaneous (Left Arm) Isabel Caprice, CMA   Discharged on 05/02/2022   Admitted on 04/30/2022   04/11/2022 1039 Given 300 mg Subcutaneous ([name] Arm) Katherina [name] D, CMA      dupilumab (DUPIXENT) prefilled syringe 300 mg     Date Action Dose Route User   06/06/2022 1452 Given 300 mg Subcutaneous ([name] Arm) Herbie Drape, LPN      methylPREDNISolone acetate (DEPO-MEDROL) injection 80 mg     Date Action Dose Route User   Discharged on 06/01/2022   Admitted on 05/27/2022   Discharged on 05/02/2022   Admitted on 04/30/2022   04/11/2022 1623 Given 80 mg Intramuscular ([name] Upper Outer Quadrant) Archie Balboa, CMA          Latest Ref Rng & Units 05/02/2018    3:58 PM  PFT Results  FVC-Pre L 1.92   FVC-Predicted Pre % 75   Pre FEV1/FVC % % 55   FEV1-Pre L 1.06   FEV1-Predicted Pre % 52     Lab Results  Component Value Date   NITRICOXIDE 19 01/14/2018        Assessment & Plan:   Asthma-COPD overlap syndrome ([place]) Exacerbating recently. Clinically improving. She did have some increased SOB a  few days ago after smoke exposure. Anticipated to complete prednisone in 2 days; recommended taper. Continue triple therapy regimen and PRN albuterol. We may need to consider different biologic if she continues to have exacerbations. Concern for hypercarbic respiratory failure, requiring BiPAP - may need BiPAP or NIV at night. Will check ABG to assess CO2. She continues to have some left sided back pain, which was worked up during her stay and suspected to be related to bronchospasm. She does feel like it is slowly improving.   Patient Instructions  Continue Breztri 2 puffs twice daily.  Brush tongue and rinse mouth afterwards Continue Albuterol inhaler 2 puffs or 3 mL neb every 6 hours as needed for shortness of breath or wheezing. Notify if symptoms persist despite rescue inhaler/neb use. Continue Zyrtec 10 mg daily for allergies Continue Xyzal 1 tab daily for allergies Continue Singulair 10 mg nightly Continue Protonix 40 mg daily Continue Flonase nasal spray 1 spray each nostril twice daily as needed for allergies/runny nose/nasal congestion Continue Dupixent injections as scheduled.  Continue prednisone 40 mg for next 2 days, then 3 tabs for 2 days, 2 tabs for 2 days, then 1 tab for 2 days, then stop  Arterial Blood Gas - someone will contact you for scheduling  Follow up in one month with Dr. Lamonte Sakai or Alanson Aly. If symptoms do not improve or worsen, please contact office for sooner  follow up or seek emergency care.     PND (paroxysmal nocturnal dyspnea) Episodes of PND and occasional gasping at night; witness in the hospital. She was put on BiPAP during each hospital stay. Concern for CO2 retention from COPD; unable to rule out sleep apnea. I am concerned there may be underlying OSA given her symptoms. Check outpatient ABG; if less than 50, will order sleep study for further evaluation.   Acute respiratory failure with hypoxia and hypercapnia (HCC) Two hospitalizations. Clinically  improving since most recent stay. See above plan.  Allergic rhinitis Well-controlled on current regimen. Continue Xyzal and Zyrtec. Continue flonase for postnasal drainage control.   I spent 35 minutes of dedicated to the care of this patient on the date of this encounter to include pre-visit review of records, face-to-face time with the patient discussing conditions above, post visit ordering of testing, clinical documentation with the electronic health record, making appropriate referrals as documented, and communicating necessary findings to members of the patients care team.  Clayton Bibles, NP 06/07/2022  Pt aware and understands NP's role.

## 2022-06-07 NOTE — Assessment & Plan Note (Signed)
Well-controlled on current regimen. Continue Xyzal and Zyrtec. Continue flonase for postnasal drainage control.

## 2022-06-07 NOTE — Assessment & Plan Note (Signed)
Two hospitalizations. Clinically improving since most recent stay. See above plan.

## 2022-06-07 NOTE — Assessment & Plan Note (Addendum)
Exacerbating recently. Clinically improving. She did have some increased SOB a few days ago after smoke exposure. Anticipated to complete prednisone in 2 days; recommended taper. Continue triple therapy regimen and PRN albuterol. We may need to consider different biologic if she continues to have exacerbations. Concern for hypercarbic respiratory failure, requiring BiPAP - may need BiPAP or NIV at night. Will check ABG to assess CO2. She continues to have some left sided back pain, which was worked up during her stay and suspected to be related to bronchospasm. She does feel like it is slowly improving.   Patient Instructions  Continue Breztri 2 puffs twice daily.  Brush tongue and rinse mouth afterwards Continue Albuterol inhaler 2 puffs or 3 mL neb every 6 hours as needed for shortness of breath or wheezing. Notify if symptoms persist despite rescue inhaler/neb use. Continue Zyrtec 10 mg daily for allergies Continue Xyzal 1 tab daily for allergies Continue Singulair 10 mg nightly Continue Protonix 40 mg daily Continue Flonase nasal spray 1 spray each nostril twice daily as needed for allergies/runny nose/nasal congestion Continue Dupixent injections as scheduled.  Continue prednisone 40 mg for next 2 days, then 3 tabs for 2 days, 2 tabs for 2 days, then 1 tab for 2 days, then stop  Arterial Blood Gas - someone will contact you for scheduling  Follow up in one month with Dr. Lamonte Sakai or Alanson Aly. If symptoms do not improve or worsen, please contact office for sooner follow up or seek emergency care.

## 2022-06-08 ENCOUNTER — Ambulatory Visit: Payer: Medicare HMO

## 2022-06-12 ENCOUNTER — Encounter: Payer: Self-pay | Admitting: Internal Medicine

## 2022-06-12 ENCOUNTER — Ambulatory Visit (INDEPENDENT_AMBULATORY_CARE_PROVIDER_SITE_OTHER): Payer: Medicare HMO | Admitting: Internal Medicine

## 2022-06-12 VITALS — BP 138/70 | HR 84 | Temp 98.9°F | Ht 63.0 in | Wt 157.6 lb

## 2022-06-12 DIAGNOSIS — E559 Vitamin D deficiency, unspecified: Secondary | ICD-10-CM | POA: Diagnosis not present

## 2022-06-12 DIAGNOSIS — I10 Essential (primary) hypertension: Secondary | ICD-10-CM

## 2022-06-12 DIAGNOSIS — J441 Chronic obstructive pulmonary disease with (acute) exacerbation: Secondary | ICD-10-CM

## 2022-06-12 DIAGNOSIS — R7303 Prediabetes: Secondary | ICD-10-CM

## 2022-06-12 NOTE — Progress Notes (Signed)
Patient ID: [name], female   DOB: [birth year], 61 y.o.   MRN: [id number]        Chief Complaint: follow up        HPI:  Keryl Gholson is a 61 y.o. female here after hospn again with copd/asthma exacerbation, very sensitive to environmental cologne per pt and has had multiple recent mod to severe exacerbations; today fortunately Pt denies chest pain, increased sob or doe, wheezing, orthopnea, PND, increased LE swelling, palpitations, dizziness or syncope.   Pt denies polydipsia, polyuria, or new focal neuro s/s.  Pt denies fever, wt loss, night sweats, loss of appetite, or other constitutional symptoms   Not taking Vit d.         Wt Readings from Last 3 Encounters:  06/12/22 157 lb 9.6 oz (71.5 kg)  06/07/22 158 lb 3.2 oz (71.8 kg)  05/28/22 156 lb 8.4 oz (71 kg)   BP Readings from Last 3 Encounters:  06/15/22 126/75  06/12/22 138/70  06/07/22 (!) 148/86         Past Medical History:  Diagnosis Date   Anemia, iron deficiency 12/22/2014   pt. 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      Bipolar disorder ([place])    COPD (chronic obstructive pulmonary disease) ([place])    Depression    Eczema 05/18/2022   Fibromyalgia 05/14/2014   GERD (gastroesophageal reflux disease)    History of kidney stones    Hyperlipidemia 04/20/2017   HYPERTENSION 05/12/2009   Qualifier: Diagnosis of  By: Lenn Cal Deborra Medina), [name]     Kidney stones    Peripheral vascular disease ([place])    Pneumonia    Prediabetes 02/23/2014   pt. denies   Seizure ([place])    Stroke ([place]) 11/2020   Urticaria    Past Surgical History:  Procedure Laterality Date   ABDOMINAL HYSTERECTOMY     ANTERIOR CERVICAL DECOMP/DISCECTOMY FUSION N/A 07/28/2020   Procedure: ANTERIOR CERVICAL  DECOMPRESSION/DISCECTOMY FUSION. INTERBODY PROTHESIS, PLATE/SCREWS CERVICAL THREE- CERVICAL FOUR, CERVICAL FOUR- CERVICAL FIVE;  Surgeon: Newman Pies, MD;  Location: [facility];  Service: Neurosurgery;  Laterality: N/A;   BACK SURGERY     COLONOSCOPY  12/20/2011   Procedure: COLONOSCOPY;  Surgeon: Landry Dyke, MD;  Location: [facility];  Service: Endoscopy;  Laterality: N/A;   COLONOSCOPY  03/05/2012   Procedure: COLONOSCOPY;  Surgeon: Landry Dyke, MD;  Location: [facility];  Service: Endoscopy;  Laterality: N/A;   DIAGNOSTIC LAPAROSCOPY     HEMORRHOID SURGERY     INCISE AND DRAIN ABCESS     KIDNEY STONE SURGERY     NECK SURGERY     x 2 Dr Orinda Kenner   SPINE SURGERY  2019   TOE SURGERY     TUBAL LIGATION      reports that she quit smoking about 3 years ago. Her smoking use included cigarettes. She has a 9.00 pack-year smoking history. She has never used smokeless tobacco. She reports that she does not currently use alcohol. She reports current drug use. Drug: Marijuana. family history includes Alcohol abuse in her brother and father; Anemia in her daughter, sister, and sister; Anesthesia problems in her father; Asthma in her brother, mother, and sister; Brain cancer in her sister; COPD in her mother and  sister; Colon cancer (age of onset: 55) in her father; Diabetes in her brother, father, mother, sister, sister, sister, and sister; Heart disease in her brother, brother, mother, and sister; Hypertension in her brother, daughter, sister, sister, sister, sister, and sister; Kidney disease in her father; Sleep apnea in her brother. Allergies  Allergen Reactions   Belsomra [Suvorexant] Other (See Comments)    hallucinations   Sulfa Antibiotics Nausea And Vomiting   Venofer [Iron Sucrose] Hives   No current facility-administered medications on file prior to visit.   Current Outpatient Medications on File Prior to Visit  Medication Sig Dispense Refill   albuterol (PROVENTIL) (2.5  MG/3ML) 0.083% nebulizer solution Take 3 mLs (2.5 mg total) by nebulization every 6 (six) hours as needed for wheezing or shortness of breath. 120 mL 0   albuterol (VENTOLIN HFA) 108 (90 Base) MCG/ACT inhaler Inhale 2 puffs into the lungs every 4 (four) hours as needed for wheezing or shortness of breath. 18 g 11   atorvastatin (LIPITOR) 40 MG tablet Take 1 tablet (40 mg total) by mouth daily. TAKE 1 TABLET(40 MG) BY MOUTH DAILY Strength: 40 mg (Patient taking differently: Take 40 mg by mouth daily.) 90 tablet 3   azelastine (OPTIVAR) 0.05 % ophthalmic solution Place 1 drop into both eyes 2 (two) times daily. 6 mL 12   BELSOMRA 15 MG TABS Take 1 tablet by mouth at bedtime as needed (sleep).     Biotin 1000 MCG tablet Take 1,000 mcg by mouth daily.     Budeson-Glycopyrrol-Formoterol (BREZTRI AEROSPHERE) 160-9-4.8 MCG/ACT AERO Inhale 2 puffs into the lungs in the morning and at bedtime. 5.9 g 6   carbamazepine (TEGRETOL) 200 MG tablet Take 2 tablets (400 mg total) by mouth 2 (two) times daily. 120 tablet 12   cetirizine (ZYRTEC) 10 MG tablet Can take one tablet by mouth once daily if needed for runny nose. (Patient taking differently: Take 10 mg by mouth daily as needed for rhinitis.) 30 tablet 5   cyclobenzaprine (FLEXERIL) 5 MG tablet TAKE 1 TABLET(5 MG) BY MOUTH THREE TIMES DAILY AS NEEDED FOR MUSCLE SPASMS (Patient taking differently: Take 5 mg by mouth 3 (three) times daily as needed for muscle spasms.) 60 tablet 5   diclofenac Sodium (VOLTAREN) 1 % GEL Apply 4 g topically 4 (four) times daily. (Patient taking differently: Apply 4 g topically 4 (four) times daily as needed (pain).) 100 g 0   DUPIXENT 300 MG/2ML prefilled syringe Inject 300 mg into the skin every 28 (twenty-eight) days.     estradiol (ESTRACE) 0.5 MG tablet Take 1 tablet (0.5 mg total) by mouth daily. 90 tablet 4   famotidine (PEPCID) 20 MG tablet Take 20 mg by mouth daily as needed for heartburn or indigestion. OTC     fluticasone  (FLONASE) 50 MCG/ACT nasal spray Place 1 spray into both nostrils 2 (two) times daily as needed for allergies or rhinitis. 16 g 5   gabapentin (NEURONTIN) 100 MG capsule Take 1 capsule (100 mg total) by mouth 3 (three) times daily. 90 capsule 3   hydrochlorothiazide (MICROZIDE) 12.5 MG capsule Take 1 capsule (12.5 mg total) by mouth daily. TAKE 1 CAPSULE(12.5 MG) BY MOUTH DAILY Strength: 12.5 mg (Patient taking differently: Take 12.5 mg by mouth daily.) 90 capsule 3   hydrOXYzine (VISTARIL) 25 MG capsule Take 1 when angry and if not calmer in 1 hour repeat (Patient taking differently: Take 25 mg by mouth every 6 (six) hours as needed for anxiety.) 45 capsule  6   ipratropium (ATROVENT HFA) 17 MCG/ACT inhaler Inhale 2 puffs into the lungs every 6 (six) hours as needed for wheezing. 1 each 12   irbesartan (AVAPRO) 75 MG tablet TAKE 1 TABLET(75 MG) BY MOUTH DAILY (Patient taking differently: Take 75 mg by mouth daily.) 90 tablet 1   levocetirizine (XYZAL) 5 MG tablet Take 1 tablet (5 mg total) by mouth every evening. 30 tablet 5   montelukast (SINGULAIR) 10 MG tablet TAKE 1 TABLET(10 MG) BY MOUTH AT BEDTIME (Patient taking differently: Take 10 mg by mouth at bedtime.) 30 tablet 0   ondansetron (ZOFRAN ODT) 4 MG disintegrating tablet Take 1 tablet (4 mg total) by mouth every 8 (eight) hours as needed for nausea or vomiting. 20 tablet 0   pantoprazole (PROTONIX) 40 MG tablet TAKE 1 TABLET BY MOUTH EVERY DAY (Patient taking differently: Take 40 mg by mouth daily.) 90 tablet 1   potassium chloride (KLOR-CON M) 10 MEQ tablet Take 1 tablet (10 mEq total) by mouth daily. 90 tablet 3   predniSONE (DELTASONE) 20 MG tablet Take 2 tablets (40 mg total) by mouth daily with breakfast. (Patient not taking: Reported on 06/13/2022) 14 tablet 0   traZODone (DESYREL) 100 MG tablet Take 1 tablet (100 mg total) by mouth at bedtime as needed for sleep. 90 tablet 1   Vitamin D, Ergocalciferol, (DRISDOL) 1.25 MG (50000 UNIT) CAPS  capsule Take 1 capsule (50,000 Units total) by mouth every 7 (seven) days. 5 capsule 0        ROS:  All others reviewed and negative.  Objective        PE:  BP 138/70 (BP Location: Right Arm, Patient Position: Sitting, Cuff Size: Large)   Pulse 84   Temp 98.9 F (37.2 C) (Oral)   Ht '5\' 3"'$  (1.6 m)   Wt 157 lb 9.6 oz (71.5 kg)   SpO2 96%   BMI 27.92 kg/m                 Constitutional: Pt appears in NAD               HENT: Head: NCAT.                Right Ear: External ear normal.                 Left Ear: External ear normal.                Eyes: . Pupils are equal, round, and reactive to light. Conjunctivae and EOM are normal               Nose: without d/c or deformity               Neck: Neck supple. Gross normal ROM               Cardiovascular: Normal rate and regular rhythm.                 Pulmonary/Chest: Effort normal and breath sounds without rales or wheezing.                Abd:  Soft, NT, ND, + BS, no organomegaly               Neurological: Pt is alert. At baseline orientation, motor grossly intact               Skin: Skin is warm. No rashes, no other new lesions, LE edema - none  Psychiatric: Pt behavior is normal without agitation   Micro: none  Cardiac tracings I have personally interpreted today:  none  Pertinent Radiological findings (summarize): none   Lab Results  Component Value Date   WBC 19.4 (H) 06/14/2022   HGB 8.9 (L) 06/14/2022   HCT 29.1 (L) 06/14/2022   PLT 387 06/14/2022   GLUCOSE 120 (H) 06/14/2022   CHOL 186 05/18/2022   TRIG 55.0 05/18/2022   HDL 96.00 05/18/2022   LDLCALC 79 05/18/2022   ALT 22 06/14/2022   AST 17 06/14/2022   NA 141 06/14/2022   K 3.6 06/14/2022   CL 104 06/14/2022   CREATININE 0.92 06/14/2022   BUN 16 06/14/2022   CO2 31 06/14/2022   TSH 0.20 (L) 05/18/2022   INR 0.9 02/06/2022   HGBA1C 6.3 05/18/2022   MICROALBUR 0.9 04/11/2021   Assessment/Plan:  Ruari Duggan is a 61 y.o.  Black or African American [2] female with  has a past medical history of Anemia, iron deficiency (12/22/2014), Anxiety, Arthritis, ASTHMA (05/12/2009), Bipolar disorder ([place]), COPD (chronic obstructive pulmonary disease) ([place]), Depression, Eczema (05/18/2022), Fibromyalgia (05/14/2014), GERD (gastroesophageal reflux disease), History of kidney stones, Hyperlipidemia (04/20/2017), HYPERTENSION (05/12/2009), Kidney stones, Peripheral vascular disease ([place]), Pneumonia, Prediabetes (02/23/2014), Seizure ([place]), Stroke ([place]) (11/2020), and Urticaria.  COPD/asthma Currently stable today, cont current albuterol inhaler prn  Prediabetes Lab Results  Component Value Date   HGBA1C 6.3 05/18/2022   Stable, pt to continue current medical treatment  - diet, wt control, excercise   Essential (primary) hypertension BP Readings from Last 3 Encounters:  06/15/22 126/75  06/12/22 138/70  06/07/22 (!) 148/86   Stable, pt to continue medical treatment hct 12.5 mg qd    Vitamin D deficiency Last vitamin D Lab Results  Component Value Date   VD25OH 23.94 (L) 05/18/2022   Low, reminded to start oral replacement  Followup: Return if symptoms worsen or fail to improve.  Cathlean Cower, MD 06/15/2022 9:09 PM [facility]

## 2022-06-13 ENCOUNTER — Emergency Department (HOSPITAL_COMMUNITY): Payer: Medicare HMO

## 2022-06-13 ENCOUNTER — Telehealth: Payer: Self-pay | Admitting: Nurse Practitioner

## 2022-06-13 ENCOUNTER — Inpatient Hospital Stay (HOSPITAL_COMMUNITY)
Admission: EM | Admit: 2022-06-13 | Discharge: 2022-06-17 | DRG: 190 | Disposition: A | Discharge status: Home / Self Care | Payer: Medicare HMO | Attending: Internal Medicine | Admitting: Internal Medicine

## 2022-06-13 DIAGNOSIS — Z825 Family history of asthma and other chronic lower respiratory diseases: Secondary | ICD-10-CM | POA: Diagnosis not present

## 2022-06-13 DIAGNOSIS — E876 Hypokalemia: Secondary | ICD-10-CM | POA: Diagnosis present

## 2022-06-13 DIAGNOSIS — Z800 Family history of malignant neoplasm of digestive organs: Secondary | ICD-10-CM

## 2022-06-13 DIAGNOSIS — K219 Gastro-esophageal reflux disease without esophagitis: Secondary | ICD-10-CM | POA: Diagnosis present

## 2022-06-13 DIAGNOSIS — D72829 Elevated white blood cell count, unspecified: Secondary | ICD-10-CM | POA: Diagnosis present

## 2022-06-13 DIAGNOSIS — Z79899 Other long term (current) drug therapy: Secondary | ICD-10-CM | POA: Diagnosis not present

## 2022-06-13 DIAGNOSIS — J441 Chronic obstructive pulmonary disease with (acute) exacerbation: Principal | ICD-10-CM | POA: Diagnosis present

## 2022-06-13 DIAGNOSIS — E785 Hyperlipidemia, unspecified: Secondary | ICD-10-CM | POA: Diagnosis not present

## 2022-06-13 DIAGNOSIS — M797 Fibromyalgia: Secondary | ICD-10-CM | POA: Diagnosis present

## 2022-06-13 DIAGNOSIS — I739 Peripheral vascular disease, unspecified: Secondary | ICD-10-CM | POA: Diagnosis present

## 2022-06-13 DIAGNOSIS — Z8249 Family history of ischemic heart disease and other diseases of the circulatory system: Secondary | ICD-10-CM

## 2022-06-13 DIAGNOSIS — Z888 Allergy status to other drugs, medicaments and biological substances status: Secondary | ICD-10-CM | POA: Diagnosis not present

## 2022-06-13 DIAGNOSIS — Z20822 Contact with and (suspected) exposure to covid-19: Secondary | ICD-10-CM | POA: Diagnosis not present

## 2022-06-13 DIAGNOSIS — Z87891 Personal history of nicotine dependence: Secondary | ICD-10-CM | POA: Diagnosis not present

## 2022-06-13 DIAGNOSIS — R0602 Shortness of breath: Secondary | ICD-10-CM | POA: Diagnosis not present

## 2022-06-13 DIAGNOSIS — Z833 Family history of diabetes mellitus: Secondary | ICD-10-CM

## 2022-06-13 DIAGNOSIS — R569 Unspecified convulsions: Secondary | ICD-10-CM | POA: Diagnosis not present

## 2022-06-13 DIAGNOSIS — Z8673 Personal history of transient ischemic attack (TIA), and cerebral infarction without residual deficits: Secondary | ICD-10-CM | POA: Diagnosis not present

## 2022-06-13 DIAGNOSIS — M1990 Unspecified osteoarthritis, unspecified site: Secondary | ICD-10-CM | POA: Diagnosis present

## 2022-06-13 DIAGNOSIS — Z882 Allergy status to sulfonamides status: Secondary | ICD-10-CM | POA: Diagnosis not present

## 2022-06-13 DIAGNOSIS — Z7951 Long term (current) use of inhaled steroids: Secondary | ICD-10-CM

## 2022-06-13 DIAGNOSIS — J9602 Acute respiratory failure with hypercapnia: Secondary | ICD-10-CM | POA: Diagnosis not present

## 2022-06-13 DIAGNOSIS — Z841 Family history of disorders of kidney and ureter: Secondary | ICD-10-CM

## 2022-06-13 DIAGNOSIS — Z811 Family history of alcohol abuse and dependence: Secondary | ICD-10-CM

## 2022-06-13 DIAGNOSIS — R0689 Other abnormalities of breathing: Secondary | ICD-10-CM | POA: Diagnosis not present

## 2022-06-13 DIAGNOSIS — R000 Tachycardia, unspecified: Secondary | ICD-10-CM | POA: Diagnosis not present

## 2022-06-13 DIAGNOSIS — R7303 Prediabetes: Secondary | ICD-10-CM | POA: Diagnosis present

## 2022-06-13 DIAGNOSIS — D649 Anemia, unspecified: Secondary | ICD-10-CM | POA: Diagnosis present

## 2022-06-13 DIAGNOSIS — I10 Essential (primary) hypertension: Secondary | ICD-10-CM | POA: Diagnosis not present

## 2022-06-13 DIAGNOSIS — J9601 Acute respiratory failure with hypoxia: Secondary | ICD-10-CM | POA: Diagnosis not present

## 2022-06-13 DIAGNOSIS — T380X5A Adverse effect of glucocorticoids and synthetic analogues, initial encounter: Secondary | ICD-10-CM | POA: Diagnosis present

## 2022-06-13 DIAGNOSIS — G40909 Epilepsy, unspecified, not intractable, without status epilepticus: Secondary | ICD-10-CM | POA: Diagnosis not present

## 2022-06-13 DIAGNOSIS — Z808 Family history of malignant neoplasm of other organs or systems: Secondary | ICD-10-CM

## 2022-06-13 LAB — COMPREHENSIVE METABOLIC PANEL
ALT: 27 U/L (ref 0–44)
AST: 20 U/L (ref 15–41)
Albumin: 3.6 g/dL (ref 3.5–5.0)
Alkaline Phosphatase: 67 U/L (ref 38–126)
Anion gap: 9 (ref 5–15)
BUN: 16 mg/dL (ref 6–20)
CO2: 30 mmol/L (ref 22–32)
Calcium: 9.1 mg/dL (ref 8.9–10.3)
Chloride: 103 mmol/L (ref 98–111)
Creatinine, Ser: 0.73 mg/dL (ref 0.44–1.00)
GFR, Estimated: >60 mL/min (ref >60)
Glucose, Bld: 144 mg/dL — ABNORMAL HIGH (ref 70–99)
Potassium: 3.5 mmol/L (ref 3.5–5.1)
Sodium: 142 mmol/L (ref 135–145)
Total Bilirubin: 0.3 mg/dL (ref 0.3–1.2)
Total Protein: 6 g/dL — ABNORMAL LOW (ref 6.5–8.1)

## 2022-06-13 LAB — I-STAT ARTERIAL BLOOD GAS, ED
Acid-Base Excess: 6 mmol/L — ABNORMAL HIGH (ref 0.0–2.0)
Bicarbonate: 32.5 mmol/L — ABNORMAL HIGH (ref 20.0–28.0)
Calcium, Ion: 1.22 mmol/L (ref 1.15–1.40)
HCT: 30 % — ABNORMAL LOW (ref 36.0–46.0)
Hemoglobin: 10.2 g/dL — ABNORMAL LOW (ref 12.0–15.0)
O2 Saturation: 100 %
Potassium: 3.1 mmol/L — ABNORMAL LOW (ref 3.5–5.1)
Sodium: 139 mmol/L (ref 135–145)
TCO2: 34 mmol/L — ABNORMAL HIGH (ref 22–32)
pCO2 arterial: 57.7 mmHg — ABNORMAL HIGH (ref 32–48)
pH, Arterial: 7.359 (ref 7.35–7.45)
pO2, Arterial: 253 mmHg — ABNORMAL HIGH (ref 83–108)

## 2022-06-13 LAB — CBC WITH DIFFERENTIAL/PLATELET
Abs Immature Granulocytes: 0.28 10*3/uL — ABNORMAL HIGH (ref 0.00–0.07)
Basophils Absolute: 0.1 10*3/uL (ref 0.0–0.1)
Basophils Relative: 0 %
Eosinophils Absolute: 0.1 10*3/uL (ref 0.0–0.5)
Eosinophils Relative: 0 %
HCT: 32 % — ABNORMAL LOW (ref 36.0–46.0)
Hemoglobin: 9.9 g/dL — ABNORMAL LOW (ref 12.0–15.0)
Immature Granulocytes: 1 %
Lymphocytes Relative: 28 %
Lymphs Abs: 6.4 10*3/uL — ABNORMAL HIGH (ref 0.7–4.0)
MCH: 26.3 pg (ref 26.0–34.0)
MCHC: 30.9 g/dL (ref 30.0–36.0)
MCV: 85.1 fL (ref 80.0–100.0)
Monocytes Absolute: 2.2 10*3/uL — ABNORMAL HIGH (ref 0.1–1.0)
Monocytes Relative: 10 %
Neutro Abs: 14 10*3/uL — ABNORMAL HIGH (ref 1.7–7.7)
Neutrophils Relative %: 61 %
Platelets: 434 10*3/uL — ABNORMAL HIGH (ref 150–400)
RBC: 3.76 MIL/uL — ABNORMAL LOW (ref 3.87–5.11)
RDW: 17.1 % — ABNORMAL HIGH (ref 11.5–15.5)
WBC: 23.1 10*3/uL — ABNORMAL HIGH (ref 4.0–10.5)
nRBC: 0 % (ref 0.0–0.2)

## 2022-06-13 LAB — SARS CORONAVIRUS 2 BY RT PCR: SARS Coronavirus 2 by RT PCR: NEGATIVE

## 2022-06-13 LAB — TROPONIN I (HIGH SENSITIVITY)
Troponin I (High Sensitivity): 7 ng/L (ref <18)
Troponin I (High Sensitivity): 8 ng/L (ref <18)

## 2022-06-13 LAB — GLUCOSE, CAPILLARY: Glucose-Capillary: 152 mg/dL — ABNORMAL HIGH (ref 70–99)

## 2022-06-13 LAB — BRAIN NATRIURETIC PEPTIDE: B Natriuretic Peptide: 26 pg/mL (ref 0.0–100.0)

## 2022-06-13 MED ORDER — ACETAMINOPHEN 650 MG RE SUPP: 650.0000 mg | Freq: Four times a day (QID) | RECTAL | Status: DC | PRN

## 2022-06-13 MED ORDER — METHYLPREDNISOLONE SODIUM SUCC 40 MG IJ SOLR
40.0000 mg | Freq: Two times a day (BID) | INTRAMUSCULAR | Status: DC
  Administered 2022-06-14: 40 mg via INTRAVENOUS
  Filled 2022-06-13: qty 1

## 2022-06-13 MED ORDER — SODIUM CHLORIDE 0.9 % IV SOLN
1.0000 g | INTRAVENOUS | Status: DC
  Administered 2022-06-14 – 2022-06-16 (×3): 1 g via INTRAVENOUS
  Filled 2022-06-13 (×3): qty 10

## 2022-06-13 MED ORDER — HEPARIN SODIUM (PORCINE) 5000 UNIT/ML IJ SOLN
5000.0000 [IU] | Freq: Three times a day (TID) | INTRAMUSCULAR | Status: DC
Start: 2022-06-13 — End: 2022-06-17
  Administered 2022-06-13 – 2022-06-17 (×11): 5000 [IU] via SUBCUTANEOUS
  Filled 2022-06-13 (×11): qty 1

## 2022-06-13 MED ORDER — ONDANSETRON HCL 4 MG PO TABS: 4.0000 mg | ORAL_TABLET | Freq: Four times a day (QID) | ORAL | Status: DC | PRN

## 2022-06-13 MED ORDER — ALBUTEROL SULFATE (2.5 MG/3ML) 0.083% IN NEBU: 2.5000 mg | INHALATION_SOLUTION | RESPIRATORY_TRACT | Status: DC | PRN

## 2022-06-13 MED ORDER — ALBUTEROL SULFATE (2.5 MG/3ML) 0.083% IN NEBU
10.0000 mg/h | INHALATION_SOLUTION | Freq: Once | RESPIRATORY_TRACT | Status: AC
  Administered 2022-06-13: 10 mg/h via RESPIRATORY_TRACT
  Filled 2022-06-13: qty 12

## 2022-06-13 MED ORDER — MAGNESIUM SULFATE 2 GM/50ML IV SOLN
2.0000 g | Freq: Once | INTRAVENOUS | Status: AC
  Administered 2022-06-13: 2 g via INTRAVENOUS
  Filled 2022-06-13: qty 50

## 2022-06-13 MED ORDER — ACETAMINOPHEN 325 MG PO TABS: 650.0000 mg | ORAL_TABLET | Freq: Four times a day (QID) | ORAL | Status: DC | PRN

## 2022-06-13 MED ORDER — PREDNISONE 20 MG PO TABS: 40.0000 mg | ORAL_TABLET | Freq: Every day | ORAL | Status: DC

## 2022-06-13 MED ORDER — AZITHROMYCIN 500 MG PO TABS
500.0000 mg | ORAL_TABLET | Freq: Every day | ORAL | Status: AC
Start: 2022-06-13 — End: 2022-06-15
  Administered 2022-06-13 – 2022-06-15 (×3): 500 mg via ORAL
  Filled 2022-06-13 (×3): qty 1

## 2022-06-13 MED ORDER — ONDANSETRON HCL 4 MG/2ML IJ SOLN: 4.0000 mg | Freq: Four times a day (QID) | INTRAMUSCULAR | Status: DC | PRN

## 2022-06-13 MED ORDER — IPRATROPIUM-ALBUTEROL 0.5-2.5 (3) MG/3ML IN SOLN
3.0000 mL | Freq: Four times a day (QID) | RESPIRATORY_TRACT | Status: DC
  Administered 2022-06-13 – 2022-06-14 (×3): 3 mL via RESPIRATORY_TRACT
  Filled 2022-06-13 (×3): qty 3

## 2022-06-13 MED ORDER — IPRATROPIUM BROMIDE 0.02 % IN SOLN
0.5000 mg | Freq: Once | RESPIRATORY_TRACT | Status: AC
  Administered 2022-06-13: 0.5 mg via RESPIRATORY_TRACT
  Filled 2022-06-13: qty 2.5

## 2022-06-13 NOTE — H&P (Signed)
History and Physical    [name] ERD:[id number] DOB: January 26, 1961 DOA: 06/13/2022  PCP: Biagio Borg, MD  Patient coming from:  home  I have personally briefly reviewed patient's old medical records in [place]  Chief Complaint: acute sob  HPI: [name] is a 61 y.o. female with medical history significant for asthma-COPD overlap syndrome, allergic rhinitis, anxiety/depression, GERD, hypertension, hyperlipidemia, PVD, prediabetes, seizure.  Patient has interim history of 3 admission for COPD exacerbation since 01/2022 with last being 05/27/22-06/01/22 at which time she was discharged on prolonged steroid taper s/p treatment of acute hypoxic/hypercapnic respiratory failure in setting of COPD exacerbation. Patient now returns to ED BIB EMS, who notes in the field she  had significant wob with tripoding.  She was treated with nebs, IV magnesium,  epi, IV Solu-Medrol.  Per patient she noted increase sob last pm s/p taking a walk. She states symptoms progressed and this am became more acute.  He notes she had recently followed up with her pcp 24 hours prior to symptoms starting and felt well at that time. She also noted she saw her pulmonary MD s/p discharge who has given her a longer steroid taper. She notes she has 2 more days left of taper.  She notes no associated , fever ,chest pain /chills/ n/v/or abdominal pain . She did however note 5 episode of loose stools last pm but this has since resolved. She notes she feels much improved at this time. ED Course:   On presentation, she required BiPAP.  ABG showed ph 7.359, pco2:57.7,  Patient s/p treatment in ed was readily transitioned of bipap to [facility]:  Wbc 23, hgb 9.9, plt 434, pmn 14 Ce 7,8 NA 142, K3.5, gly 144, cr 0.73 CE 7 Bnp 26  Covid neg HUD:JSHFW tachycardia  no hyperacute st-twave changes Tx douneb, magnesium, Cxr NAD Review of Systems: As per HPI otherwise 10 point review of systems negative.    Past Medical History:  Diagnosis Date   Anemia, iron deficiency 12/22/2014   pt. 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      Bipolar disorder ([place])    COPD (chronic obstructive pulmonary disease) ([place])    Depression    Eczema 05/18/2022   Fibromyalgia 05/14/2014   GERD (gastroesophageal reflux disease)    History of kidney stones    Hyperlipidemia 04/20/2017   HYPERTENSION 05/12/2009   Qualifier: Diagnosis of  By: Lenn Cal Deborra Medina), [name]     Kidney stones    Peripheral vascular disease ([place])    Pneumonia    Prediabetes 02/23/2014   pt. denies   Seizure ([place])    Stroke ([place]) 11/2020   Urticaria     Past Surgical History:  Procedure Laterality Date   ABDOMINAL HYSTERECTOMY     ANTERIOR CERVICAL DECOMP/DISCECTOMY FUSION N/A 07/28/2020   Procedure: ANTERIOR CERVICAL DECOMPRESSION/DISCECTOMY FUSION. INTERBODY PROTHESIS, PLATE/SCREWS CERVICAL THREE- CERVICAL FOUR, CERVICAL FOUR- CERVICAL FIVE;  Surgeon: Newman Pies, MD;  Location: [facility];  Service: Neurosurgery;  Laterality: N/A;   BACK SURGERY     COLONOSCOPY  12/20/2011   Procedure: COLONOSCOPY;  Surgeon: Landry Dyke, MD;  Location: [facility];  Service: Endoscopy;  Laterality: N/A;   COLONOSCOPY  03/05/2012   Procedure: COLONOSCOPY;  Surgeon: Landry Dyke,  MD;  Location: [facility];  Service: Endoscopy;  Laterality: N/A;   DIAGNOSTIC LAPAROSCOPY     HEMORRHOID SURGERY     INCISE AND DRAIN ABCESS     KIDNEY STONE SURGERY     NECK SURGERY     x 2 Dr Orinda Kenner   SPINE SURGERY  2019   TOE SURGERY     TUBAL LIGATION       reports that she quit smoking about 3 years ago. Her smoking use included cigarettes. She has a 9.00 pack-year smoking history. She has never used smokeless tobacco. She reports that she does  not currently use alcohol. She reports current drug use. Drug: Marijuana.  Allergies  Allergen Reactions   Belsomra [Suvorexant] Other (See Comments)    hallucinations   Sulfa Antibiotics Nausea And Vomiting   Venofer [Iron Sucrose] Hives    Family History  Problem Relation Age of Onset   Diabetes Mother    COPD Mother    Heart disease Mother    Asthma Mother    Diabetes Father    Kidney disease Father    Anesthesia problems Father    Alcohol abuse Father    Colon cancer Father 48   Diabetes Sister    Hypertension Sister    Heart disease Sister    Diabetes Sister    Brain cancer Sister    Hypertension Sister    Asthma Sister    Diabetes Sister    COPD Sister    Hypertension Sister    Anemia Sister    Hypertension Sister    Diabetes Sister    Anemia Sister    Hypertension Sister    Diabetes Brother    Sleep apnea Brother    Asthma Brother    Alcohol abuse Brother    Heart disease Brother    Heart disease Brother    Hypertension Brother    Hypertension Daughter    Anemia Daughter    Allergic rhinitis Neg Hx    Eczema Neg Hx    Urticaria Neg Hx    Esophageal cancer Neg Hx    Prostate cancer Neg Hx    Rectal cancer Neg Hx     Prior to Admission medications   Medication Sig Start Date End Date Taking? Authorizing Provider  albuterol (PROVENTIL) (2.5 MG/3ML) 0.083% nebulizer solution Take 3 mLs (2.5 mg total) by nebulization every 6 (six) hours as needed for wheezing or shortness of breath. 06/01/22   Aline August, MD  albuterol (VENTOLIN HFA) 108 (90 Base) MCG/ACT inhaler Inhale 2 puffs into the lungs every 4 (four) hours as needed for wheezing or shortness of breath. 04/11/22   Biagio Borg, MD  atorvastatin (LIPITOR) 40 MG tablet Take 1 tablet (40 mg total) by mouth daily. TAKE 1 TABLET(40 MG) BY MOUTH DAILY Strength: 40 mg Patient taking differently: Take 40 mg by mouth daily. 05/23/22   Biagio Borg, MD  azelastine (OPTIVAR) 0.05 % ophthalmic solution Place 1  drop into both eyes 2 (two) times daily. 05/18/22   Biagio Borg, MD  BELSOMRA 15 MG TABS Take 1 tablet by mouth at bedtime as needed (sleep). 05/24/22   [provider]  Biotin 1000 MCG tablet Take 1,000 mcg by mouth daily.    [provider]  Budeson-Glycopyrrol-Formoterol (BREZTRI AEROSPHERE) 160-9-4.8 MCG/ACT AERO Inhale 2 puffs into the lungs in the morning and at bedtime. 05/24/22   Collene Gobble, MD  carbamazepine (TEGRETOL) 200 MG tablet Take 2 tablets (400 mg  total) by mouth 2 (two) times daily. 03/23/22   Plovsky, Berneta Sages, MD  cetirizine (ZYRTEC) 10 MG tablet Can take one tablet by mouth once daily if needed for runny nose. Patient taking differently: Take 10 mg by mouth daily as needed for rhinitis. 04/28/21   Ambs, Kathrine Cords, FNP  cyclobenzaprine (FLEXERIL) 5 MG tablet TAKE 1 TABLET(5 MG) BY MOUTH THREE TIMES DAILY AS NEEDED FOR MUSCLE SPASMS Patient taking differently: Take 5 mg by mouth 3 (three) times daily as needed for muscle spasms. 05/23/22   Biagio Borg, MD  diclofenac Sodium (VOLTAREN) 1 % GEL Apply 4 g topically 4 (four) times daily. Patient taking differently: Apply 4 g topically 4 (four) times daily as needed (pain). 06/04/20   Deno Etienne, DO  DUPIXENT 300 MG/2ML prefilled syringe Inject 300 mg into the skin every 28 (twenty-eight) days. 04/18/22   [provider]  estradiol (ESTRACE) 0.5 MG tablet Take 1 tablet (0.5 mg total) by mouth daily. 09/21/21   Princess Bruins, MD  famotidine (PEPCID) 20 MG tablet Take 20 mg by mouth daily as needed for heartburn or indigestion. OTC    [provider]  fluticasone (FLONASE) 50 MCG/ACT nasal spray Place 1 spray into both nostrils 2 (two) times daily as needed for allergies or rhinitis. 09/06/21   Valentina Shaggy, MD  gabapentin (NEURONTIN) 100 MG capsule Take 1 capsule (100 mg total) by mouth 3 (three) times daily. 03/23/22   Plovsky, Berneta Sages, MD  hydrochlorothiazide (MICROZIDE) 12.5 MG capsule Take 1 capsule  (12.5 mg total) by mouth daily. TAKE 1 CAPSULE(12.5 MG) BY MOUTH DAILY Strength: 12.5 mg Patient taking differently: Take 12.5 mg by mouth daily. 05/23/22   Biagio Borg, MD  hydrOXYzine (VISTARIL) 25 MG capsule Take 1 when angry and if not calmer in 1 hour repeat Patient taking differently: Take 25 mg by mouth See admin instructions. Take 1 capsule when becoming angry and repeat in 1 hour if not calmer. 03/23/22   Plovsky, Berneta Sages, MD  ipratropium (ATROVENT HFA) 17 MCG/ACT inhaler Inhale 2 puffs into the lungs every 6 (six) hours as needed for wheezing. 01/09/22   Valentina Shaggy, MD  irbesartan (AVAPRO) 75 MG tablet TAKE 1 TABLET(75 MG) BY MOUTH DAILY Patient taking differently: Take 75 mg by mouth daily. 11/06/21   Biagio Borg, MD  levocetirizine (XYZAL) 5 MG tablet Take 1 tablet (5 mg total) by mouth every evening. 09/06/21   Valentina Shaggy, MD  montelukast (SINGULAIR) 10 MG tablet TAKE 1 TABLET(10 MG) BY MOUTH AT BEDTIME Patient taking differently: Take 10 mg by mouth at bedtime. 12/06/21   Collene Gobble, MD  ondansetron (ZOFRAN ODT) 4 MG disintegrating tablet Take 1 tablet (4 mg total) by mouth every 8 (eight) hours as needed for nausea or vomiting. 05/19/21   Valentina Shaggy, MD  pantoprazole (PROTONIX) 40 MG tablet TAKE 1 TABLET BY MOUTH EVERY DAY Patient taking differently: Take 40 mg by mouth daily. 05/24/21   Noralyn Pick, NP  potassium chloride (KLOR-CON M) 10 MEQ tablet Take 1 tablet (10 mEq total) by mouth daily. 05/23/22   Biagio Borg, MD  predniSONE (DELTASONE) 20 MG tablet Take 2 tablets (40 mg total) by mouth daily with breakfast. 06/01/22   Aline August, MD  traZODone (DESYREL) 100 MG tablet Take 1 tablet (100 mg total) by mouth at bedtime as needed for sleep. 05/18/22   Biagio Borg, MD  UNKNOWN TO PATIENT allergy shots  every 2  weeks    [provider]  Vitamin D, Ergocalciferol, (DRISDOL) 1.25 MG (50000 UNIT) CAPS capsule Take 1 capsule (50,000  Units total) by mouth every 7 (seven) days. 06/05/22   Aline August, MD    Physical Exam: Vitals:   06/13/22 1045 06/13/22 1245 06/13/22 1248  BP: (!) 122/92 129/72 132/72  Pulse: 95  92  Resp: '19 17 15  '$ SpO2: 100%  96%    Vitals:   06/13/22 1045 06/13/22 1245 06/13/22 1248  BP: (!) 122/92 129/72 132/72  Pulse: 95  92  Resp: '19 17 15  '$ SpO2: 100%  96%   Constitutional: NAD, calm, comfortable Eyes: PERRL, lids and conjunctivae normal ENMT: Mucous membranes are moist. Posterior pharynx clear of any exudate or lesions.Normal dentition.  Neck: normal, supple, no masses, no thyromegaly Respiratory: + exp wheezing, no crackles. Normal respiratory effort. No accessory muscle use.  Cardiovascular: Regular rate and rhythm, no murmurs / rubs / gallops. No extremity edema. 2+ pedal pulses. No carotid bruits.  Abdomen: no tenderness, no masses palpated. No hepatosplenomegaly. Bowel sounds positive.  Musculoskeletal: no clubbing / cyanosis. No joint deformity upper and lower extremities. Good ROM, no contractures. Normal muscle tone.  Skin: no rashes, lesions, ulcers. No induration Neurologic: CN 2-12 grossly intact. Sensation intactStrength 5/5 in all 4.  Psychiatric: Normal judgment and insight. Alert and oriented x 3. Normal mood.    Labs on Admission: I have personally reviewed following labs and imaging studies  CBC: Recent Labs  Lab 06/13/22 1010 06/13/22 1044  WBC 23.1*  --   NEUTROABS 14.0*  --   HGB 9.9* 10.2*  HCT 32.0* 30.0*  MCV 85.1  --   PLT 434*  --    Basic Metabolic Panel: Recent Labs  Lab 06/13/22 1010 06/13/22 1044  NA 142 139  K 3.5 3.1*  CL 103  --   CO2 30  --   GLUCOSE 144*  --   BUN 16  --   CREATININE 0.73  --   CALCIUM 9.1  --    GFR: Estimated Creatinine Clearance: 71.7 mL/min (by C-G formula based on SCr of 0.73 mg/dL). Liver Function Tests: Recent Labs  Lab 06/13/22 1010  AST 20  ALT 27  ALKPHOS 67  BILITOT 0.3  PROT 6.0*  ALBUMIN  3.6   No results for input(s): "LIPASE", "AMYLASE" in the last 168 hours. No results for input(s): "AMMONIA" in the last 168 hours. Coagulation Profile: No results for input(s): "INR", "PROTIME" in the last 168 hours. Cardiac Enzymes: No results for input(s): "CKTOTAL", "CKMB", "CKMBINDEX", "TROPONINI" in the last 168 hours. BNP (last 3 results) No results for input(s): "PROBNP" in the last 8760 hours. HbA1C: No results for input(s): "HGBA1C" in the last 72 hours. CBG: No results for input(s): "GLUCAP" in the last 168 hours. Lipid Profile: No results for input(s): "CHOL", "HDL", "LDLCALC", "TRIG", "CHOLHDL", "LDLDIRECT" in the last 72 hours. Thyroid Function Tests: No results for input(s): "TSH", "T4TOTAL", "FREET4", "T3FREE", "THYROIDAB" in the last 72 hours. Anemia Panel: No results for input(s): "VITAMINB12", "FOLATE", "FERRITIN", "TIBC", "IRON", "RETICCTPCT" in the last 72 hours. Urine analysis:    Component Value Date/Time   COLORURINE YELLOW 05/18/2022 1603   APPEARANCEUR CLEAR 05/18/2022 1603   LABSPEC 1.015 05/18/2022 1603   PHURINE 6.0 05/18/2022 1603   GLUCOSEU NEGATIVE 05/18/2022 1603   HGBUR NEGATIVE 05/18/2022 1603   BILIRUBINUR NEGATIVE 05/18/2022 1603   KETONESUR NEGATIVE 05/18/2022 1603   PROTEINUR NEGATIVE 05/05/2021 1922   UROBILINOGEN 0.2  05/18/2022 1603   NITRITE NEGATIVE 05/18/2022 1603   LEUKOCYTESUR NEGATIVE 05/18/2022 1603    Radiological Exams on Admission: DG Chest Portable 1 View  Result Date: 06/13/2022 CLINICAL DATA:  Shortness of breath EXAM: PORTABLE CHEST 1 VIEW COMPARISON:  05/28/2022 FINDINGS: Stable heart size and vascularity. Minor basilar atelectasis. No focal pneumonia, collapse or consolidation. Negative for edema, effusion or pneumothorax. Trachea midline. Artifact overlies the right upper lung medially. Lower cervical fusion hardware noted. IMPRESSION: No active chest disease by plain radiography. Electronically Signed   By: Jerilynn Mages.  Shick  M.D.   On: 06/13/2022 10:34    EKG: Independently reviewed. See above  Assessment/Plan Acute COPD exacerbation with acute hypoxic hypercapnic respiratory failure -with requirement of bipap due to increase wob  -admit to progressive care  - solumedrol iv , will most likely need prolonged prednisone taper  - ctx/ azithromycin  -f/u on sputum cultures  -consider further imaging of chest if patient does not improve -cxr : NAD - nebs standing and prn  -resume chronic inhalers  -pulmonary toilet  -wean O2 as able    Leukocytosis -stress response /steroid use   Hypertension -Resume home regimen.   Hyperlipidemia -Continue Lipitor  Seizure disorder -continue Tegretol  Normocytic anemia -Hemoglobin stable.  GERD -ppi  Prediabetes -monitor poc glucose on steroids  DVT prophylaxis: heparin Code Status: full Family Communication: none at bedside   Disposition Plan: patient  expected to be admitted less than 2 midnights  Consults called: n/a Admission status: progressive care    Clance Boll MD [facility]  If 7PM-7AM, please contact night-coverage [email or web address] Password [email or web address]  06/13/2022, 1:21 PM

## 2022-06-13 NOTE — Telephone Encounter (Signed)
ATC patient, LMTCB 

## 2022-06-13 NOTE — ED Triage Notes (Signed)
Pt BIB [facility] from home c/o SOB. Sudden onset after getting out of shower. Pt was tripoding upon EMS arrival. Hx asthma and COPD. A&O X4.   Meds given by EMS:  '15mg'$  albuterol  1 Atrovent  2 g mag  125 solumedrol  0.3 epi IM

## 2022-06-13 NOTE — ED Provider Notes (Signed)
[facility] EMERGENCY DEPARTMENT Provider Note   CSN: [id number] Arrival date & time: 06/13/22  1002     History  Chief Complaint  Patient presents with   Respiratory Distress    [name] is a 61 y.o. female.  Level 5 caveat for respiratory distress.  Patient with a history of COPD/asthma presenting with difficulty breathing since getting out of the shower this morning.  EMS reports she had very diminished breath sounds, was tripoding on their arrival.  They gave albuterol, Atrovent, steroids, magnesium and epinephrine.  She is starting to feel somewhat better.  Does not wear oxygen at home.  Just saw her pulmonologist and PCP this week.  States she was doing well since her recent hospitalization 2 weeks ago.  Currently on a prednisone taper and is down to 20 mg daily and has 2 days left.  Reports compliance with her medications.  States all of a sudden she came to the shower today and could not breathe.  Denies any chest pain.  Has a nonproductive cough.  No fever.  No leg pain or leg swelling.  No abdominal pain, nausea or vomiting.  States her neighbors smoke but she does not  The history is provided by the patient and the EMS personnel. The history is limited by the condition of the patient.       Home Medications Prior to Admission medications   Medication Sig Start Date End Date Taking? Authorizing Provider  albuterol (PROVENTIL) (2.5 MG/3ML) 0.083% nebulizer solution Take 3 mLs (2.5 mg total) by nebulization every 6 (six) hours as needed for wheezing or shortness of breath. 06/01/22   Aline August, MD  albuterol (VENTOLIN HFA) 108 (90 Base) MCG/ACT inhaler Inhale 2 puffs into the lungs every 4 (four) hours as needed for wheezing or shortness of breath. 04/11/22   Biagio Borg, MD  atorvastatin (LIPITOR) 40 MG tablet Take 1 tablet (40 mg total) by mouth daily. TAKE 1 TABLET(40 MG) BY MOUTH DAILY Strength: 40 mg Patient taking differently: Take  40 mg by mouth daily. 05/23/22   Biagio Borg, MD  azelastine (OPTIVAR) 0.05 % ophthalmic solution Place 1 drop into both eyes 2 (two) times daily. 05/18/22   Biagio Borg, MD  BELSOMRA 15 MG TABS Take 1 tablet by mouth at bedtime as needed (sleep). 05/24/22   [provider]  Biotin 1000 MCG tablet Take 1,000 mcg by mouth daily.    [provider]  Budeson-Glycopyrrol-Formoterol (BREZTRI AEROSPHERE) 160-9-4.8 MCG/ACT AERO Inhale 2 puffs into the lungs in the morning and at bedtime. 05/24/22   Collene Gobble, MD  carbamazepine (TEGRETOL) 200 MG tablet Take 2 tablets (400 mg total) by mouth 2 (two) times daily. 03/23/22   Plovsky, Berneta Sages, MD  cetirizine (ZYRTEC) 10 MG tablet Can take one tablet by mouth once daily if needed for runny nose. Patient taking differently: Take 10 mg by mouth daily as needed for rhinitis. 04/28/21   Ambs, Kathrine Cords, FNP  cyclobenzaprine (FLEXERIL) 5 MG tablet TAKE 1 TABLET(5 MG) BY MOUTH THREE TIMES DAILY AS NEEDED FOR MUSCLE SPASMS Patient taking differently: Take 5 mg by mouth 3 (three) times daily as needed for muscle spasms. 05/23/22   Biagio Borg, MD  diclofenac Sodium (VOLTAREN) 1 % GEL Apply 4 g topically 4 (four) times daily. Patient taking differently: Apply 4 g topically 4 (four) times daily as needed (pain). 06/04/20   Deno Etienne, DO  DUPIXENT 300 MG/2ML prefilled syringe Inject  300 mg into the skin every 28 (twenty-eight) days. 04/18/22   [provider]  estradiol (ESTRACE) 0.5 MG tablet Take 1 tablet (0.5 mg total) by mouth daily. 09/21/21   Princess Bruins, MD  famotidine (PEPCID) 20 MG tablet Take 20 mg by mouth daily as needed for heartburn or indigestion. OTC    [provider]  fluticasone (FLONASE) 50 MCG/ACT nasal spray Place 1 spray into both nostrils 2 (two) times daily as needed for allergies or rhinitis. 09/06/21   Valentina Shaggy, MD  gabapentin (NEURONTIN) 100 MG capsule Take 1 capsule (100 mg total) by mouth 3 (three)  times daily. 03/23/22   Plovsky, Berneta Sages, MD  hydrochlorothiazide (MICROZIDE) 12.5 MG capsule Take 1 capsule (12.5 mg total) by mouth daily. TAKE 1 CAPSULE(12.5 MG) BY MOUTH DAILY Strength: 12.5 mg Patient taking differently: Take 12.5 mg by mouth daily. 05/23/22   Biagio Borg, MD  hydrOXYzine (VISTARIL) 25 MG capsule Take 1 when angry and if not calmer in 1 hour repeat Patient taking differently: Take 25 mg by mouth See admin instructions. Take 1 capsule when becoming angry and repeat in 1 hour if not calmer. 03/23/22   Plovsky, Berneta Sages, MD  ipratropium (ATROVENT HFA) 17 MCG/ACT inhaler Inhale 2 puffs into the lungs every 6 (six) hours as needed for wheezing. 01/09/22   Valentina Shaggy, MD  irbesartan (AVAPRO) 75 MG tablet TAKE 1 TABLET(75 MG) BY MOUTH DAILY Patient taking differently: Take 75 mg by mouth daily. 11/06/21   Biagio Borg, MD  levocetirizine (XYZAL) 5 MG tablet Take 1 tablet (5 mg total) by mouth every evening. 09/06/21   Valentina Shaggy, MD  montelukast (SINGULAIR) 10 MG tablet TAKE 1 TABLET(10 MG) BY MOUTH AT BEDTIME Patient taking differently: Take 10 mg by mouth at bedtime. 12/06/21   Collene Gobble, MD  ondansetron (ZOFRAN ODT) 4 MG disintegrating tablet Take 1 tablet (4 mg total) by mouth every 8 (eight) hours as needed for nausea or vomiting. 05/19/21   Valentina Shaggy, MD  pantoprazole (PROTONIX) 40 MG tablet TAKE 1 TABLET BY MOUTH EVERY DAY Patient taking differently: Take 40 mg by mouth daily. 05/24/21   Noralyn Pick, NP  potassium chloride (KLOR-CON M) 10 MEQ tablet Take 1 tablet (10 mEq total) by mouth daily. 05/23/22   Biagio Borg, MD  predniSONE (DELTASONE) 20 MG tablet Take 2 tablets (40 mg total) by mouth daily with breakfast. 06/01/22   Aline August, MD  traZODone (DESYREL) 100 MG tablet Take 1 tablet (100 mg total) by mouth at bedtime as needed for sleep. 05/18/22   Biagio Borg, MD  UNKNOWN TO PATIENT allergy shots  every 2 weeks    [provider]  Vitamin D, Ergocalciferol, (DRISDOL) 1.25 MG (50000 UNIT) CAPS capsule Take 1 capsule (50,000 Units total) by mouth every 7 (seven) days. 06/05/22   Aline August, MD      Allergies    Belsomra [suvorexant], Sulfa antibiotics, and Venofer [iron sucrose]    Review of Systems   Review of Systems  Unable to perform ROS: Severe respiratory distress    Physical Exam Updated Vital Signs There were no vitals taken for this visit. Physical Exam Vitals and nursing note reviewed.  Constitutional:      General: She is in acute distress.     Appearance: She is well-developed. She is ill-appearing.     Comments: Respiratory distress, speaking 1-2 word phrases  HENT:     Head:  Normocephalic and atraumatic.     Mouth/Throat:     Pharynx: No oropharyngeal exudate.  Eyes:     Conjunctiva/sclera: Conjunctivae normal.     Pupils: Pupils are equal, round, and reactive to light.  Neck:     Comments: No meningismus. Cardiovascular:     Rate and Rhythm: Normal rate and regular rhythm.     Heart sounds: Normal heart sounds. No murmur heard. Pulmonary:     Effort: Respiratory distress present.     Breath sounds: Wheezing present.     Comments: Very diminished bilaterally, faint expiratory wheezing Abdominal:     Palpations: Abdomen is soft.     Tenderness: There is no abdominal tenderness. There is no guarding or rebound.  Musculoskeletal:        General: No tenderness. Normal range of motion.     Cervical back: Normal range of motion and neck supple.  Skin:    General: Skin is warm.  Neurological:     Mental Status: She is alert and oriented to person, place, and time.     Cranial Nerves: No cranial nerve deficit.     Motor: No abnormal muscle tone.     Coordination: Coordination normal.     Comments:  5/5 strength throughout. CN 2-12 intact.Equal grip strength.   Psychiatric:        Behavior: Behavior normal.     ED Results / Procedures / Treatments   Labs (all labs  ordered are listed, but only abnormal results are displayed) Labs Reviewed  CBC WITH DIFFERENTIAL/PLATELET - Abnormal; Notable for the following components:      Result Value   WBC 23.1 (*)    RBC 3.76 (*)    Hemoglobin 9.9 (*)    HCT 32.0 (*)    RDW 17.1 (*)    Platelets 434 (*)    Neutro Abs 14.0 (*)    Lymphs Abs 6.4 (*)    Monocytes Absolute 2.2 (*)    Abs Immature Granulocytes 0.28 (*)    All other components within normal limits  COMPREHENSIVE METABOLIC PANEL - Abnormal; Notable for the following components:   Glucose, Bld 144 (*)    Total Protein 6.0 (*)    All other components within normal limits  I-STAT ARTERIAL BLOOD GAS, ED - Abnormal; Notable for the following components:   pCO2 arterial 57.7 (*)    pO2, Arterial 253 (*)    Bicarbonate 32.5 (*)    TCO2 34 (*)    Acid-Base Excess 6.0 (*)    Potassium 3.1 (*)    HCT 30.0 (*)    Hemoglobin 10.2 (*)    All other components within normal limits  SARS CORONAVIRUS 2 BY RT PCR  BRAIN NATRIURETIC PEPTIDE  TROPONIN I (HIGH SENSITIVITY)  TROPONIN I (HIGH SENSITIVITY)    EKG EKG Interpretation  Date/Time:  Wednesday June 13 2022 10:11:43 EDT Ventricular Rate:  110 PR Interval:  136 QRS Duration: 78 QT Interval:  319 QTC Calculation: 434 R Axis:   69 Text Interpretation: Sinus tachycardia Consider right atrial enlargement Borderline repolarization abnormality Nonspecific ST abnormality Confirmed by Ezequiel Essex [phone]) on 06/13/2022 10:27:30 AM  Radiology DG Chest Portable 1 View  Result Date: 06/13/2022 CLINICAL DATA:  Shortness of breath EXAM: PORTABLE CHEST 1 VIEW COMPARISON:  05/28/2022 FINDINGS: Stable heart size and vascularity. Minor basilar atelectasis. No focal pneumonia, collapse or consolidation. Negative for edema, effusion or pneumothorax. Trachea midline. Artifact overlies the right upper lung medially. Lower cervical fusion hardware noted. IMPRESSION:  No active chest disease by plain radiography.  Electronically Signed   By: Jerilynn Mages.  Shick M.D.   On: 06/13/2022 10:34    Procedures .Critical Care  Performed by: Ezequiel Essex, MD Authorized by: Ezequiel Essex, MD   Critical care provider statement:    Critical care time (minutes):  45   Critical care time was exclusive of:  Separately billable procedures and treating other patients   Critical care was necessary to treat or prevent imminent or life-threatening deterioration of the following conditions:  Respiratory failure   Critical care was time spent personally by me on the following activities:  Development of treatment plan with patient or surrogate, discussions with consultants, evaluation of patient's response to treatment, examination of patient, ordering and review of laboratory studies, ordering and review of radiographic studies, ordering and performing treatments and interventions, pulse oximetry, re-evaluation of patient's condition and review of old charts   I assumed direction of critical care for this patient from another provider in my specialty: no     Care discussed with: admitting provider       Medications Ordered in ED Medications  magnesium sulfate IVPB 2 g 50 mL (has no administration in time range)  albuterol (PROVENTIL) (2.5 MG/3ML) 0.083% nebulizer solution (10 mg/hr Nebulization Given 06/13/22 1026)  ipratropium (ATROVENT) nebulizer solution 0.5 mg (0.5 mg Nebulization Given 06/13/22 1026)    ED Course/ Medical Decision Making/ A&P                           History of COPD sent from home with severe respiratory distress onset this morning while she was getting out of the shower.  On arrival she has very diminished breath sounds.  She was given bronchodilators, steroids, magnesium and epinephrine by EMS.  X-ray shows no pneumothorax or pneumonia.  Results reviewed and interpreted by me  ABG is obtained and shows no significant CO2 retention.  PCO2 is 57.  Labs show stable anemia.  Leukocytosis noted likely  secondary to recent steroid use  Respiratory acidosis is well compensated on ABG.  Low suspicion for ACS, pulmonary embolism, aortic dissection. No pneumonia seen on chest x-ray.  Patient with improved work of breathing, wheezing has improved.  Would benefit from continued breathing treatments.    Plan admission for COPD exacerbation.  Discussed with Dr. Marcello Moores       Final Clinical Impression(s) / ED Diagnoses Final diagnoses:  COPD exacerbation [facility])    Rx / DC Orders ED Discharge Orders     None         Keilyn Haggard, Annie Main, MD 06/13/22 1515

## 2022-06-14 DIAGNOSIS — I10 Essential (primary) hypertension: Secondary | ICD-10-CM | POA: Diagnosis not present

## 2022-06-14 DIAGNOSIS — E785 Hyperlipidemia, unspecified: Secondary | ICD-10-CM

## 2022-06-14 DIAGNOSIS — J441 Chronic obstructive pulmonary disease with (acute) exacerbation: Secondary | ICD-10-CM | POA: Diagnosis not present

## 2022-06-14 DIAGNOSIS — K219 Gastro-esophageal reflux disease without esophagitis: Secondary | ICD-10-CM | POA: Diagnosis not present

## 2022-06-14 LAB — CBC
HCT: 29.1 % — ABNORMAL LOW (ref 36.0–46.0)
Hemoglobin: 8.9 g/dL — ABNORMAL LOW (ref 12.0–15.0)
MCH: 26.2 pg (ref 26.0–34.0)
MCHC: 30.6 g/dL (ref 30.0–36.0)
MCV: 85.6 fL (ref 80.0–100.0)
Platelets: 387 10*3/uL (ref 150–400)
RBC: 3.4 MIL/uL — ABNORMAL LOW (ref 3.87–5.11)
RDW: 17.2 % — ABNORMAL HIGH (ref 11.5–15.5)
WBC: 19.4 10*3/uL — ABNORMAL HIGH (ref 4.0–10.5)
nRBC: 0.1 % (ref 0.0–0.2)

## 2022-06-14 LAB — COMPREHENSIVE METABOLIC PANEL
ALT: 22 U/L (ref 0–44)
AST: 17 U/L (ref 15–41)
Albumin: 3.3 g/dL — ABNORMAL LOW (ref 3.5–5.0)
Alkaline Phosphatase: 59 U/L (ref 38–126)
Anion gap: 6 (ref 5–15)
BUN: 16 mg/dL (ref 6–20)
CO2: 31 mmol/L (ref 22–32)
Calcium: 8.7 mg/dL — ABNORMAL LOW (ref 8.9–10.3)
Chloride: 104 mmol/L (ref 98–111)
Creatinine, Ser: 0.92 mg/dL (ref 0.44–1.00)
GFR, Estimated: >60 mL/min (ref >60)
Glucose, Bld: 120 mg/dL — ABNORMAL HIGH (ref 70–99)
Potassium: 3.6 mmol/L (ref 3.5–5.1)
Sodium: 141 mmol/L (ref 135–145)
Total Bilirubin: 0.3 mg/dL (ref 0.3–1.2)
Total Protein: 5.8 g/dL — ABNORMAL LOW (ref 6.5–8.1)

## 2022-06-14 LAB — GLUCOSE, CAPILLARY: Glucose-Capillary: 169 mg/dL — ABNORMAL HIGH (ref 70–99)

## 2022-06-14 MED ORDER — OXYMETAZOLINE HCL 0.05 % NA SOLN
1.0000 | Freq: Two times a day (BID) | NASAL | Status: AC
Start: 2022-06-14 — End: 2022-06-17
  Administered 2022-06-14 – 2022-06-15 (×4): 1 via NASAL
  Filled 2022-06-14: qty 30

## 2022-06-14 MED ORDER — PANTOPRAZOLE SODIUM 40 MG PO TBEC: 40.0000 mg | DELAYED_RELEASE_TABLET | Freq: Every day | ORAL | Status: DC

## 2022-06-14 MED ORDER — FLUTICASONE PROPIONATE 50 MCG/ACT NA SUSP
2.0000 | Freq: Every day | NASAL | Status: DC
  Administered 2022-06-14 – 2022-06-17 (×4): 2 via NASAL
  Filled 2022-06-14: qty 16

## 2022-06-14 MED ORDER — TRAZODONE HCL 50 MG PO TABS
100.0000 mg | ORAL_TABLET | Freq: Every evening | ORAL | Status: DC | PRN
Start: 2022-06-14 — End: 2022-06-17
  Administered 2022-06-16: 100 mg via ORAL
  Filled 2022-06-14: qty 2

## 2022-06-14 MED ORDER — BUDESONIDE 0.25 MG/2ML IN SUSP
0.2500 mg | Freq: Two times a day (BID) | RESPIRATORY_TRACT | Status: DC
  Administered 2022-06-14 – 2022-06-17 (×6): 0.25 mg via RESPIRATORY_TRACT
  Filled 2022-06-14 (×6): qty 2

## 2022-06-14 MED ORDER — IPRATROPIUM-ALBUTEROL 0.5-2.5 (3) MG/3ML IN SOLN
3.0000 mL | Freq: Two times a day (BID) | RESPIRATORY_TRACT | Status: DC
  Administered 2022-06-15 – 2022-06-17 (×5): 3 mL via RESPIRATORY_TRACT
  Filled 2022-06-14 (×5): qty 3

## 2022-06-14 MED ORDER — MONTELUKAST SODIUM 10 MG PO TABS
10.0000 mg | ORAL_TABLET | Freq: Every day | ORAL | Status: DC
  Administered 2022-06-14 – 2022-06-16 (×3): 10 mg via ORAL
  Filled 2022-06-14 (×3): qty 1

## 2022-06-14 MED ORDER — GABAPENTIN 100 MG PO CAPS
100.0000 mg | ORAL_CAPSULE | Freq: Three times a day (TID) | ORAL | Status: DC
  Administered 2022-06-14 – 2022-06-17 (×9): 100 mg via ORAL
  Filled 2022-06-14 (×9): qty 1

## 2022-06-14 MED ORDER — POTASSIUM CHLORIDE CRYS ER 10 MEQ PO TBCR
10.0000 meq | EXTENDED_RELEASE_TABLET | Freq: Every day | ORAL | Status: DC
  Administered 2022-06-14 – 2022-06-17 (×4): 10 meq via ORAL
  Filled 2022-06-14 (×4): qty 1

## 2022-06-14 MED ORDER — LORATADINE 10 MG PO TABS
10.0000 mg | ORAL_TABLET | Freq: Every day | ORAL | Status: DC
  Administered 2022-06-14 – 2022-06-17 (×4): 10 mg via ORAL
  Filled 2022-06-14 (×4): qty 1

## 2022-06-14 MED ORDER — CYCLOBENZAPRINE HCL 5 MG PO TABS: 5.0000 mg | ORAL_TABLET | Freq: Three times a day (TID) | ORAL | Status: DC | PRN

## 2022-06-14 MED ORDER — PANTOPRAZOLE SODIUM 40 MG PO TBEC
40.0000 mg | DELAYED_RELEASE_TABLET | Freq: Two times a day (BID) | ORAL | Status: DC
  Administered 2022-06-14 – 2022-06-17 (×7): 40 mg via ORAL
  Filled 2022-06-14 (×7): qty 1

## 2022-06-14 MED ORDER — CARBAMAZEPINE 200 MG PO TABS
400.0000 mg | ORAL_TABLET | Freq: Two times a day (BID) | ORAL | Status: DC
  Administered 2022-06-14 – 2022-06-17 (×7): 400 mg via ORAL
  Filled 2022-06-14 (×7): qty 2

## 2022-06-14 MED ORDER — METHYLPREDNISOLONE SODIUM SUCC 40 MG IJ SOLR
40.0000 mg | Freq: Two times a day (BID) | INTRAMUSCULAR | Status: DC
Start: 2022-06-14 — End: 2022-06-17
  Administered 2022-06-14 – 2022-06-16 (×6): 40 mg via INTRAVENOUS
  Filled 2022-06-14 (×6): qty 1

## 2022-06-14 MED ORDER — IPRATROPIUM-ALBUTEROL 0.5-2.5 (3) MG/3ML IN SOLN
3.0000 mL | Freq: Four times a day (QID) | RESPIRATORY_TRACT | Status: DC
Start: 2022-06-14 — End: 2022-06-14
  Administered 2022-06-14 (×2): 3 mL via RESPIRATORY_TRACT
  Filled 2022-06-14 (×2): qty 3

## 2022-06-14 MED ORDER — IBUPROFEN 400 MG PO TABS
400.0000 mg | ORAL_TABLET | Freq: Once | ORAL | Status: AC
  Administered 2022-06-14: 400 mg via ORAL
  Filled 2022-06-14: qty 1

## 2022-06-14 MED ORDER — ATORVASTATIN CALCIUM 40 MG PO TABS
40.0000 mg | ORAL_TABLET | Freq: Every day | ORAL | Status: DC
  Administered 2022-06-14 – 2022-06-17 (×4): 40 mg via ORAL
  Filled 2022-06-14 (×4): qty 1

## 2022-06-14 MED ORDER — HYDROCHLOROTHIAZIDE 12.5 MG PO TABS
12.5000 mg | ORAL_TABLET | Freq: Every day | ORAL | Status: DC
  Administered 2022-06-14 – 2022-06-17 (×4): 12.5 mg via ORAL
  Filled 2022-06-14 (×4): qty 1

## 2022-06-14 MED ORDER — IBUPROFEN 600 MG PO TABS
600.0000 mg | ORAL_TABLET | Freq: Four times a day (QID) | ORAL | Status: DC | PRN
  Administered 2022-06-14 (×2): 600 mg via ORAL
  Filled 2022-06-14 (×2): qty 1

## 2022-06-14 NOTE — Progress Notes (Signed)
  Transition of Care [facility]) Screening Note   Patient Details  Name: [name] Date of Birth: 31-Dec-1960   Transition of Care [facility]) CM/SW Contact:    Cyndi Bender, RN Phone Number: 06/14/2022, 8:53 AM    Transition of Care Department [facility]) has reviewed patient. Patient has interim history of 3 admission for COPD exacerbation since 01/2022 with last being 05/27/22-06/01/22. Patient discharged with [facility] health 05/02/22. We will continue to monitor patient advancement through interdisciplinary progression rounds. If new patient transition needs arise, please place a TOC consult.

## 2022-06-14 NOTE — Plan of Care (Signed)
  Problem: Education: Goal: Knowledge of disease or condition will improve Outcome: Progressing   Problem: Activity: Goal: Ability to tolerate increased activity will improve Outcome: Progressing   Problem: Respiratory: Goal: Ability to maintain a clear airway will improve Outcome: Progressing   Problem: Education: Goal: Knowledge of General Education information will improve Description: Including pain rating scale, medication(s)/side effects and non-pharmacologic comfort measures Outcome: Progressing

## 2022-06-14 NOTE — Telephone Encounter (Signed)
Sleep study was ordered yesterday by [facility].  Please help get scheduled for her

## 2022-06-14 NOTE — Telephone Encounter (Signed)
Patient would like to let Dr. Lamonte Sakai know she is in the hospital. Patient also checking on sleep study.

## 2022-06-14 NOTE — Progress Notes (Addendum)
PROGRESS NOTE        PATIENT DETAILS Name: [name] Age: 61 y.o. Sex: female Date of Birth: [birth year] Admit Date: 06/13/2022 Admitting Physician Clance Boll, MD [facility]:QION, [name], MD  Brief Summary: Patient is a 61 y.o.  female with recent hospitalization for COPD/overlap syndrome with exacerbation (on oral prednisone)-presenting with cough/worsening shortness of breath-found to have COPD/asthma exacerbation and admitted to the hospitalist service.   Significant events: 6/28>> admit to [facility] for COPD/asthma exacerbation-required BiPAP on initial presentation to the ED.  Significant studies: 6/28>> CXR: No PNA  Significant microbiology data: 6/28>> COVID PCR: Negative  Procedures: None  Consults: None  Subjective: Better than initial presentation but still short of breath-wheezing and not yet at baseline.  Objective: Vitals: Blood pressure 130/87, pulse 78, temperature 98.4 F (36.9 C), temperature source Oral, resp. rate 19, SpO2 99 %.   Exam: Gen Exam:Alert awake-not in any distress HEENT:atraumatic, normocephalic Chest: Coarse rhonchi all over. 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  Unclear  why she is having these frequent exacerbations-no evidence of PNA-acknowledges compliance with medications.  May need to involve PCCM at some point.    Complains of reflux symptoms and postnasal drip like symptoms-hence will add PPI, Flonase/oxy metolazone nasal spray.  Will follow clinically.  Leukocytosis: From recent steroid taper/ongoing steroid use-no indication of infection-follow closely.  HTN: BP stable-resume HCTZ and follow.  HLD: Continue.  Seizure disorder: Continue Tegretol  Normocytic anemia: Chronic issue-watch closely-follow as an outpatient  GERD: Change PPI to twice daily dosing given persistent symptoms.  Allergic rhinitis/postnasal drip symptoms: Starting Flonase/Afrin/Claritin-follow-up.   BMI: Estimated body mass index is 27.92 kg/m as calculated from the following:   Height as of 06/12/22: '5\' 3"'$  (1.6 m).   Weight as of 06/12/22: 71.5 kg.   Code status:   Code Status: Full Code   DVT Prophylaxis: heparin injection 5,000 Units Start: 06/13/22 1530   Family Communication: None at bedside   Disposition Plan: Status is: Inpatient Remains inpatient appropriate because: Recurrent COPD exacerbation with hypoxia/hypercapnic respiratory failure not yet stable for discharge.  Will require several more days of hospitalization.   Planned Discharge Destination:Home   Diet: Diet Order             Diet Heart Room service appropriate? Yes; Fluid consistency: Thin  Diet effective now                     Antimicrobial agents: Anti-infectives (From admission, onward)    Start  Dose/Rate Route Frequency Ordered Stop   06/13/22 2000  azithromycin (ZITHROMAX) tablet 500 mg        500 mg Oral Daily-1800 06/13/22 1949 06/18/22 1759   06/13/22 1530  cefTRIAXone (ROCEPHIN) 1 g in sodium chloride 0.9 % 100 mL IVPB        1 g 200 mL/hr over 30 Minutes Intravenous Every 24 hours 06/13/22 1523 06/18/22 1514        MEDICATIONS: Scheduled Meds:   azithromycin  500 mg Oral q1800   heparin  5,000 Units Subcutaneous Q8H   methylPREDNISolone (SOLU-MEDROL) injection  40 mg Intravenous Q12H   Continuous Infusions:  cefTRIAXone (ROCEPHIN)  IV     PRN Meds:.acetaminophen **OR** acetaminophen, albuterol, ondansetron **OR** ondansetron (ZOFRAN) IV   I have personally reviewed following labs and imaging studies  LABORATORY DATA: CBC: Recent Labs  Lab 06/13/22 1010 06/13/22 1044 06/14/22 0127  WBC 23.1*  --  19.4*  NEUTROABS 14.0*  --   --   HGB 9.9* 10.2* 8.9*  HCT 32.0* 30.0* 29.1*  MCV 85.1  --  85.6  PLT 434*  --  371    Basic Metabolic Panel: Recent Labs  Lab 06/13/22 1010 06/13/22 1044 06/14/22 0127  NA 142 139 141  K 3.5 3.1* 3.6  CL 103  --  104  CO2 30  --  31  GLUCOSE 144*  --  120*  BUN 16  --  16  CREATININE 0.73  --  0.92  CALCIUM 9.1  --  8.7*    GFR: Estimated Creatinine Clearance: 62.4 mL/min (by C-G formula based on SCr of 0.92 mg/dL).  Liver Function Tests: Recent Labs  Lab 06/13/22 1010 06/14/22 0127  AST 20 17  ALT 27 22  ALKPHOS 67 59  BILITOT 0.3 0.3  PROT 6.0* 5.8*  ALBUMIN 3.6 3.3*   No results for input(s): "LIPASE", "AMYLASE" in the last 168 hours. No results for input(s): "AMMONIA" in the last 168 hours.  Coagulation Profile: No results for input(s): "INR", "PROTIME" in the last 168 hours.  Cardiac Enzymes: No results for input(s): "CKTOTAL", "CKMB", "CKMBINDEX", "TROPONINI" in the last 168 hours.  BNP (last 3 results) No results for input(s): "PROBNP" in the last 8760 hours.  Lipid Profile: No results for input(s): "CHOL", "HDL", "LDLCALC", "TRIG", "CHOLHDL", "LDLDIRECT" in the last 72 hours.  Thyroid Function Tests: No results for input(s): "TSH", "T4TOTAL", "FREET4", "T3FREE", "THYROIDAB" in the last 72 hours.  Anemia Panel: No results for input(s): "VITAMINB12", "FOLATE", "FERRITIN", "TIBC", "IRON", "RETICCTPCT" in the last 72 hours.  Urine analysis:     Component Value Date/Time   COLORURINE YELLOW 05/18/2022 1603   APPEARANCEUR CLEAR 05/18/2022 1603   LABSPEC 1.015 05/18/2022 1603   PHURINE 6.0 05/18/2022 1603   GLUCOSEU NEGATIVE 05/18/2022 1603   HGBUR NEGATIVE 05/18/2022 1603   BILIRUBINUR NEGATIVE 05/18/2022 1603   KETONESUR NEGATIVE 05/18/2022 1603   PROTEINUR NEGATIVE 05/05/2021 1922   UROBILINOGEN 0.2 05/18/2022 1603   NITRITE NEGATIVE 05/18/2022 1603   LEUKOCYTESUR NEGATIVE 05/18/2022 1603    Sepsis Labs: Lactic Acid, Venous    Component Value Date/Time   LATICACIDVEN 1.0 04/17/2018 0343    MICROBIOLOGY: Recent Results (from the past 240 hour(s))  SARS Coronavirus 2 by RT PCR (hospital order, performed in [facility] hospital lab) *cepheid single result test* Anterior Nasal Swab     Status: None   Collection Time: 06/13/22 12:06 PM   Specimen: Anterior Nasal Swab  Result Value Ref Range Status   SARS  Coronavirus 2 by RT PCR NEGATIVE NEGATIVE Final    Comment: (NOTE) SARS-CoV-2 target nucleic acids are NOT DETECTED.  The SARS-CoV-2 RNA is generally detectable in upper and lower respiratory specimens during the acute phase of infection. The lowest concentration of SARS-CoV-2 viral copies this assay can detect is 250 copies / mL. A negative result does not preclude SARS-CoV-2 infection and should not be used as the sole basis for treatment or other patient management decisions.  A negative result may occur with improper specimen collection / handling, submission of specimen other than nasopharyngeal swab, presence of viral mutation(s) within the areas targeted by this assay, and inadequate number of viral copies (<250 copies / mL). A negative result must be combined with clinical observations, patient history, and epidemiological information.  Fact Sheet for Patients:   https://www.patel.info/  Fact Sheet for Healthcare Providers: https://hall.com/  This test is not  yet approved or  cleared by the [place] FDA and has been authorized for detection and/or diagnosis of SARS-CoV-2 by FDA under an Emergency Use Authorization (EUA).  This EUA will remain in effect (meaning this test can be used) for the duration of the COVID-19 declaration under Section 564(b)(1) of the Act, 21 U.S.C. section 360bbb-3(b)(1), unless the authorization is terminated or revoked sooner.  Performed at [facility], [place] [address] [place], [place] [address]     RADIOLOGY STUDIES/RESULTS: DG Chest Portable 1 View  Result Date: 06/13/2022 CLINICAL DATA:  Shortness of breath EXAM: PORTABLE CHEST 1 VIEW COMPARISON:  05/28/2022 FINDINGS: Stable heart size and vascularity. Minor basilar atelectasis. No focal pneumonia, collapse or consolidation. Negative for edema, effusion or pneumothorax. Trachea midline. Artifact overlies the right upper lung medially. Lower cervical fusion hardware noted. IMPRESSION: No active chest disease by plain radiography. Electronically Signed   By: Jerilynn Mages.  Shick M.D.   On: 06/13/2022 10:34     LOS: 1 day   Oren Binet, MD  [facility]    To contact the attending provider between 7A-7P or the covering provider during after hours 7P-7A, please log into the web site [email or web address] and access using universal [place] password for that web site. If you do not have the password, please call the hospital operator.  06/14/2022, 1:12 PM

## 2022-06-15 ENCOUNTER — Encounter: Payer: Self-pay | Admitting: Internal Medicine

## 2022-06-15 DIAGNOSIS — R569 Unspecified convulsions: Secondary | ICD-10-CM | POA: Diagnosis not present

## 2022-06-15 DIAGNOSIS — K219 Gastro-esophageal reflux disease without esophagitis: Secondary | ICD-10-CM | POA: Diagnosis not present

## 2022-06-15 DIAGNOSIS — I10 Essential (primary) hypertension: Secondary | ICD-10-CM | POA: Diagnosis not present

## 2022-06-15 DIAGNOSIS — J441 Chronic obstructive pulmonary disease with (acute) exacerbation: Secondary | ICD-10-CM | POA: Diagnosis not present

## 2022-06-15 LAB — GLUCOSE, CAPILLARY: Glucose-Capillary: 171 mg/dL — ABNORMAL HIGH (ref 70–99)

## 2022-06-15 NOTE — Telephone Encounter (Signed)
[name], [name] had her ABG done during her current hospital stay. Can you please advise if she will need a sleep study? Thanks!

## 2022-06-15 NOTE — Care Management (Signed)
Ms. Lindquist continues to exhibit signs of acute on chronic hypoxic/hypercapnic respiratory failure secondary to COPD.  The use of the NIV will treat patient's high PC02 levels (57.7 on 06/13/22 with elevated bicarbonate of 32.5) and can reduce risk of exacerbations and future hospitalizations (she has had 4 hospitalizations and 2 additional ED visits so far this year) when used at night and during the day.  All alternate devices [phone] and [id number]) have been considered and ruled out as volume requirements are not met by BiLevel devices.  An NIV with volume-targeted pressure support is necessary to prevent patient from life-threatening harm.  Interruption or failure to provide NIV would quickly lead to exacerbation of the patient's condition, hospital re-admission, and likely harm to the patient. Continued use is preferred.  Patient is able to protect their airways and clear secretions on their own.

## 2022-06-15 NOTE — Assessment & Plan Note (Signed)
Lab Results  Component Value Date   HGBA1C 6.3 05/18/2022   Stable, pt to continue current medical treatment  - diet, wt control, excercise

## 2022-06-15 NOTE — Plan of Care (Signed)
  Problem: Education: Goal: Knowledge of disease or condition will improve Outcome: Progressing   Problem: Activity: Goal: Ability to tolerate increased activity will improve Outcome: Progressing   Problem: Respiratory: Goal: Ability to maintain a clear airway will improve Outcome: Progressing Goal: Levels of oxygenation will improve Outcome: Progressing Goal: Ability to maintain adequate ventilation will improve Outcome: Progressing   Problem: Education: Goal: Knowledge of General Education information will improve Description: Including pain rating scale, medication(s)/side effects and non-pharmacologic comfort measures Outcome: Progressing   Problem: Health Behavior/Discharge Planning: Goal: Ability to manage health-related needs will improve Outcome: Progressing   Problem: Activity: Goal: Risk for activity intolerance will decrease Outcome: Progressing   Problem: Coping: Goal: Level of anxiety will decrease Outcome: Progressing

## 2022-06-15 NOTE — Consult Note (Signed)
NAME:  [name], MRN:  [id number], DOB:  March 29, 1961, LOS: 2 ADMISSION DATE:  06/13/2022, CONSULTATION DATE:  06/13/2022 REFERRING MD:  Dr. Sloan Leiter - [facility], CHIEF COMPLAINT: COPD/asthma exacerbation  History of Present Illness:  [name] is a 61 year old female with a past medical history significant for COPD/asthma overlap, former smoker, allergic rhinitis, anxiety, depression, GERD, hypertension, PVD, prediabetes, and seizures who presented to the ED 6/28 with acute respiratory distress.  Of note patient has had 3 admissions for COPD exacerbations since February 2023 with most recent admission being 05/27/2022 - 06/01/2022 at which time she was discharged on a prolonged steroid taper status post treatment of acute hypoxic and hypercapnic respiratory failure in the setting of COPD exacerbation.  Per patient she reports shortness of breath worsened after taking a walk with symptoms progressing until unable to manage at home.  On ED arrival patient was seen hemodynamically stable but required BIPAP initiation on EMS arrival. [name] was relatively unremarkable despite leukocytosis in the setting of steroid taper. PCCM ws consulted 06/15/2022.     Pertinent  Medical History  COPD/asthma overlap, former smoker, allergic rhinitis, anxiety, depression, GERD, hypertension, PVD, prediabetes, and seizures  Significant Hospital Events: Including procedures, antibiotic start and stop dates in addition to other pertinent events   6/28 admitted for recurrent COPD exacerbation. 6/30 stable on RA, PCCM consulted for assistance in management   Interim History / Subjective:  Denies any acute complaints states she feels well today.   Objective   Blood pressure 137/86, pulse 62, temperature 97.6 F (36.4 C), temperature source Oral, resp. rate 16, SpO2 100 %.        Intake/Output Summary (Last 24 hours) at 06/15/2022 0906 Last data filed at 06/15/2022 0558 Gross per 24 hour   Intake 100 ml  Output 800 ml  Net -700 ml   There were no vitals filed for this visit.  Examination: General: Well-appearing middle-aged female sitting up in bed in no acute distress HEENT: [place]/AT, MM pink/moist, PERRL,  Neuro: Alert and oriented x3, nonfocal CV: s1s2 regular rate and rhythm, no murmur, rubs, or gallops,  PULM: Faint bilateral expiratory wheeze with deep inspiration, on room air, no increased work of breathing GI: soft, bowel sounds active in all 4 quadrants, non-tender, non-distended, tolerating oral diet Extremities: warm/dry, no edema  Skin: no rashes or lesions  Resolved Hospital Problem list     Assessment & Plan:  Acute COPD-asthma exacerbation -This is the fourth admission for patient since February of this year.  Last admission being 6/11 through 6/16.  On extensive discussion with patient today at bedside she reports primary trigger for exacerbations being smells like perfume, cologne and especially smoke.  Patient states she has had a new neighbor move and beginning of this year (around the time the exacerbation started) that smokes marijuana on his balcony and will often filter down to her apartment.  Patient also reports she went for a walk at her apartment complex outside the day of admission.  Exposure to poor air quality and environmental exposures such as marijuana smoke is likely what triggered current exacerbation.  She reports compliance with her home Breztri and rescue inhaler and nebulizers as needed.  She denies any fever, cough, congestion or known sick exposures prior to admission  Plan Patient extensively educated on importance of avoiding triggers.  She has already communicated with apartment manager regarding smoke exposure and is hopeful to move apartment complexes. Continue current bronchodilator regiment Continue steroids Outpatient pulmonary follow-up  Continue Xyzal, Zyrtec, and Flonase Low suspicion for infectious process, likely stop  antibiotics Can consider possible qualifying patient for home CPAP for use durin respiratory distress  Patient is interested in pulmonary rehab, will arrange  Best Practice (right click and "Reselect all SmartList Selections" daily)  Per primary  Labs   CBC: Recent Labs  Lab 06/13/22 1010 06/13/22 1044 06/14/22 0127  WBC 23.1*  --  19.4*  NEUTROABS 14.0*  --   --   HGB 9.9* 10.2* 8.9*  HCT 32.0* 30.0* 29.1*  MCV 85.1  --  85.6  PLT 434*  --  585    Basic Metabolic Panel: Recent Labs  Lab 06/13/22 1010 06/13/22 1044 06/14/22 0127  NA 142 139 141  K 3.5 3.1* 3.6  CL 103  --  104  CO2 30  --  31  GLUCOSE 144*  --  120*  BUN 16  --  16  CREATININE 0.73  --  0.92  CALCIUM 9.1  --  8.7*   GFR: Estimated Creatinine Clearance: 62.4 mL/min (by C-G formula based on SCr of 0.92 mg/dL). Recent Labs  Lab 06/13/22 1010 06/14/22 0127  WBC 23.1* 19.4*    Liver Function Tests: Recent Labs  Lab 06/13/22 1010 06/14/22 0127  AST 20 17  ALT 27 22  ALKPHOS 67 59  BILITOT 0.3 0.3  PROT 6.0* 5.8*  ALBUMIN 3.6 3.3*   No results for input(s): "LIPASE", "AMYLASE" in the last 168 hours. No results for input(s): "AMMONIA" in the last 168 hours.  ABG    Component Value Date/Time   PHART 7.359 06/13/2022 1044   PCO2ART 57.7 (H) 06/13/2022 1044   PO2ART 253 (H) 06/13/2022 1044   HCO3 32.5 (H) 06/13/2022 1044   TCO2 34 (H) 06/13/2022 1044   O2SAT 100 06/13/2022 1044     Coagulation Profile: No results for input(s): "INR", "PROTIME" in the last 168 hours.  Cardiac Enzymes: No results for input(s): "CKTOTAL", "CKMB", "CKMBINDEX", "TROPONINI" in the last 168 hours.  HbA1C: Hgb A1c MFr Bld  Date/Time Value Ref Range Status  05/18/2022 04:03 PM 6.3 4.6 - 6.5 % Final    Comment:    Glycemic Control Guidelines for People with Diabetes:Non Diabetic:  <6%Goal of Therapy: <7%Additional Action Suggested:  >8%   02/06/2022 06:01 PM 5.6 4.8 - 5.6 % Final    Comment:     (NOTE)         Prediabetes: 5.7 - 6.4         Diabetes: >6.4         Glycemic control for adults with diabetes: <7.0     CBG: Recent Labs  Lab 06/13/22 2226 06/14/22 2038  GLUCAP 152* 169*    Review of Systems:   Please see the history of present illness. All other systems reviewed and are negative   Past Medical History:  She,  has a past medical history of Anemia, iron deficiency (12/22/2014), Anxiety, Arthritis, ASTHMA (05/12/2009), Bipolar disorder ([place]), COPD (chronic obstructive pulmonary disease) ([place]), Depression, Eczema (05/18/2022), Fibromyalgia (05/14/2014), GERD (gastroesophageal reflux disease), History of kidney stones, Hyperlipidemia (04/20/2017), HYPERTENSION (05/12/2009), Kidney stones, Peripheral vascular disease ([place]), Pneumonia, Prediabetes (02/23/2014), Seizure ([place]), Stroke ([place]) (11/2020), and Urticaria.   Surgical History:   Past Surgical History:  Procedure Laterality Date   ABDOMINAL HYSTERECTOMY     ANTERIOR CERVICAL DECOMP/DISCECTOMY FUSION N/A 07/28/2020   Procedure: ANTERIOR CERVICAL DECOMPRESSION/DISCECTOMY FUSION. INTERBODY PROTHESIS, PLATE/SCREWS CERVICAL THREE- CERVICAL FOUR, CERVICAL FOUR- CERVICAL FIVE;  Surgeon: Newman Pies, MD;  Location: [facility];  Service: Neurosurgery;  Laterality: N/A;   BACK SURGERY     COLONOSCOPY  12/20/2011   Procedure: COLONOSCOPY;  Surgeon: Landry Dyke, MD;  Location: [facility];  Service: Endoscopy;  Laterality: N/A;   COLONOSCOPY  03/05/2012   Procedure: COLONOSCOPY;  Surgeon: Landry Dyke, MD;  Location: [facility];  Service: Endoscopy;  Laterality: N/A;   DIAGNOSTIC LAPAROSCOPY     HEMORRHOID SURGERY     INCISE AND DRAIN ABCESS     KIDNEY STONE SURGERY     NECK SURGERY     x 2 Dr Orinda Kenner   SPINE SURGERY  2019   TOE SURGERY     TUBAL LIGATION       Social History:   reports that she quit smoking about 3 years ago. Her smoking use included cigarettes. She has a 9.00 pack-year smoking history.  She has never used smokeless tobacco. She reports that she does not currently use alcohol. She reports current drug use. Drug: Marijuana.   Family History:  Her family history includes Alcohol abuse in her brother and father; Anemia in her daughter, sister, and sister; Anesthesia problems in her father; Asthma in her brother, mother, and sister; Brain cancer in her sister; COPD in her mother and sister; Colon cancer (age of onset: 45) in her father; Diabetes in her brother, father, mother, sister, sister, sister, and sister; Heart disease in her brother, brother, mother, and sister; Hypertension in her brother, daughter, sister, sister, sister, sister, and sister; Kidney disease in her father; Sleep apnea in her brother. There is no history of Allergic rhinitis, Eczema, Urticaria, Esophageal cancer, Prostate cancer, or Rectal cancer.   Allergies Allergies  Allergen Reactions   Belsomra [Suvorexant] Other (See Comments)    hallucinations   Sulfa Antibiotics Nausea And Vomiting   Venofer [Iron Sucrose] Hives     Home Medications  Prior to Admission medications   Medication Sig Start Date End Date Taking? Authorizing Provider  albuterol (PROVENTIL) (2.5 MG/3ML) 0.083% nebulizer solution Take 3 mLs (2.5 mg total) by nebulization every 6 (six) hours as needed for wheezing or shortness of breath. 06/01/22  Yes Aline August, MD  albuterol (VENTOLIN HFA) 108 (90 Base) MCG/ACT inhaler Inhale 2 puffs into the lungs every 4 (four) hours as needed for wheezing or shortness of breath. 04/11/22  Yes Biagio Borg, MD  atorvastatin (LIPITOR) 40 MG tablet Take 1 tablet (40 mg total) by mouth daily. TAKE 1 TABLET(40 MG) BY MOUTH DAILY Strength: 40 mg Patient taking differently: Take 40 mg by mouth daily. 05/23/22  Yes Biagio Borg, MD  azelastine (OPTIVAR) 0.05 % ophthalmic solution Place 1 drop into both eyes 2 (two) times daily. 05/18/22  Yes Biagio Borg, MD  BELSOMRA 15 MG TABS Take 1 tablet by mouth at  bedtime as needed (sleep). 05/24/22  Yes [provider]  Biotin 1000 MCG tablet Take 1,000 mcg by mouth daily.   Yes [provider]  Budeson-Glycopyrrol-Formoterol (BREZTRI AEROSPHERE) 160-9-4.8 MCG/ACT AERO Inhale 2 puffs into the lungs in the morning and at bedtime. 05/24/22  Yes Collene Gobble, MD  carbamazepine (TEGRETOL) 200 MG tablet Take 2 tablets (400 mg total) by mouth 2 (two) times daily. 03/23/22  Yes Plovsky, Berneta Sages, MD  cetirizine (ZYRTEC) 10 MG tablet Can take one tablet by mouth once daily if needed for runny nose. Patient taking differently: Take 10 mg by mouth daily as needed for rhinitis. 04/28/21  Yes Ambs, Kathrine Cords,  FNP  cyclobenzaprine (FLEXERIL) 5 MG tablet TAKE 1 TABLET(5 MG) BY MOUTH THREE TIMES DAILY AS NEEDED FOR MUSCLE SPASMS Patient taking differently: Take 5 mg by mouth 3 (three) times daily as needed for muscle spasms. 05/23/22  Yes Biagio Borg, MD  diclofenac Sodium (VOLTAREN) 1 % GEL Apply 4 g topically 4 (four) times daily. Patient taking differently: Apply 4 g topically 4 (four) times daily as needed (pain). 06/04/20  Yes Deno Etienne, DO  DUPIXENT 300 MG/2ML prefilled syringe Inject 300 mg into the skin every 28 (twenty-eight) days. 04/18/22  Yes [provider]  estradiol (ESTRACE) 0.5 MG tablet Take 1 tablet (0.5 mg total) by mouth daily. 09/21/21  Yes Princess Bruins, MD  famotidine (PEPCID) 20 MG tablet Take 20 mg by mouth daily as needed for heartburn or indigestion. OTC   Yes [provider]  fluticasone (FLONASE) 50 MCG/ACT nasal spray Place 1 spray into both nostrils 2 (two) times daily as needed for allergies or rhinitis. 09/06/21  Yes Valentina Shaggy, MD  gabapentin (NEURONTIN) 100 MG capsule Take 1 capsule (100 mg total) by mouth 3 (three) times daily. 03/23/22  Yes Plovsky, Berneta Sages, MD  hydrochlorothiazide (MICROZIDE) 12.5 MG capsule Take 1 capsule (12.5 mg total) by mouth daily. TAKE 1 CAPSULE(12.5 MG) BY MOUTH DAILY  Strength: 12.5 mg Patient taking differently: Take 12.5 mg by mouth daily. 05/23/22  Yes Biagio Borg, MD  hydrOXYzine (VISTARIL) 25 MG capsule Take 1 when angry and if not calmer in 1 hour repeat Patient taking differently: Take 25 mg by mouth every 6 (six) hours as needed for anxiety. 03/23/22  Yes Plovsky, Berneta Sages, MD  ipratropium (ATROVENT HFA) 17 MCG/ACT inhaler Inhale 2 puffs into the lungs every 6 (six) hours as needed for wheezing. 01/09/22  Yes Valentina Shaggy, MD  irbesartan (AVAPRO) 75 MG tablet TAKE 1 TABLET(75 MG) BY MOUTH DAILY Patient taking differently: Take 75 mg by mouth daily. 11/06/21  Yes Biagio Borg, MD  levocetirizine (XYZAL) 5 MG tablet Take 1 tablet (5 mg total) by mouth every evening. 09/06/21  Yes Valentina Shaggy, MD  montelukast (SINGULAIR) 10 MG tablet TAKE 1 TABLET(10 MG) BY MOUTH AT BEDTIME Patient taking differently: Take 10 mg by mouth at bedtime. 12/06/21  Yes Collene Gobble, MD  ondansetron (ZOFRAN ODT) 4 MG disintegrating tablet Take 1 tablet (4 mg total) by mouth every 8 (eight) hours as needed for nausea or vomiting. 05/19/21  Yes Valentina Shaggy, MD  pantoprazole (PROTONIX) 40 MG tablet TAKE 1 TABLET BY MOUTH EVERY DAY Patient taking differently: Take 40 mg by mouth daily. 05/24/21  Yes Noralyn Pick, NP  potassium chloride (KLOR-CON M) 10 MEQ tablet Take 1 tablet (10 mEq total) by mouth daily. 05/23/22  Yes Biagio Borg, MD  predniSONE (DELTASONE) 10 MG tablet Take 10 mg by mouth daily with breakfast.   Yes [provider]  traZODone (DESYREL) 100 MG tablet Take 1 tablet (100 mg total) by mouth at bedtime as needed for sleep. 05/18/22  Yes Biagio Borg, MD  Vitamin D, Ergocalciferol, (DRISDOL) 1.25 MG (50000 UNIT) CAPS capsule Take 1 capsule (50,000 Units total) by mouth every 7 (seven) days. 06/05/22  Yes Aline August, MD  predniSONE (DELTASONE) 20 MG tablet Take 2 tablets (40 mg total) by mouth daily with breakfast. Patient not  taking: Reported on 06/13/2022 06/01/22   Aline August, MD     Critical care time:  NA  Rory Montel D. Kenton Kingfisher,  NP-C [facility] Personal contact information can be found on Amion  06/15/2022, 10:20 AM

## 2022-06-15 NOTE — Progress Notes (Addendum)
Patient does not have signs or symptoms of sleep apnea.  I do not believe she needs a sleep study.  BIPAP recommended to prevent readmissions for chronic hypercarbic respiratory failure due to severe COPD.  Her baseline ABG is as shown.  Will get an overnight pulse oximetry to show concurrent desaturations as required by Medicare Guideline [id number].  Erskine Emery MD PCCM

## 2022-06-15 NOTE — TOC Initial Note (Addendum)
Transition of Care [facility]) - Initial/Assessment Note    Patient Details  Name: [name] MRN: [id number] Date of Birth: 1961/10/08  Transition of Care [facility]) CM/SW Contact:    Verdell Carmine, RN Phone Number: 06/15/2022, 11:29 AM  Clinical Narrative:                 Patient presented with COPD exacerbation She has had several admissions for this in the past few months. Readmission level is high.  Discussed with Pulmonary, NIV ordered. Contacted [facility] for NIV services.  Will need [place] services to follow.  Active with Alvis Lemmings, PT OT and RN ordered . TOC will continue to monitor for needs, recommendations, and transitions of care..    Expected Discharge Plan: [place] Barriers to Discharge: Continued Medical Work up   Patient Goals and CMS Choice        Expected Discharge Plan and Services Expected Discharge Plan: [place]                         DME Arranged: NIV DME Agency: [employer] Date DME Agency Contacted: 06/15/22 Time DME Agency Contacted: [phone] Representative spoke with at DME Agency: Andree Coss            Prior Living Arrangements/Services     Patient language and need for interpreter reviewed:: Yes        Need for Family Participation in Patient Care: Yes (Comment) Care giver support system in place?: Yes (comment)   Criminal Activity/Legal Involvement Pertinent to Current Situation/Hospitalization: No - Comment as needed  Activities of Daily Living      Permission Sought/Granted                  Emotional Assessment       Orientation: : Oriented to Self, Oriented to Place Alcohol / Substance Use: Not Applicable, Tobacco Use Psych Involvement: No (comment)  Admission diagnosis:  COPD exacerbation ([place]) [J44.1] Patient Active Problem List   Diagnosis Date Noted   COPD exacerbation ([place]) 06/13/2022   PND (paroxysmal nocturnal dyspnea) 06/07/2022   Acute exacerbation of COPD with  asthma ([place]) 05/28/2022   Vitamin D deficiency 05/20/2022   Low vitamin D level 05/20/2022   Eczema 05/18/2022   Insomnia 05/18/2022   Allergic conjunctivitis 05/18/2022   Acute respiratory failure with hypoxia and hypercapnia ([place]) 04/30/2022   Bilateral hearing loss 04/14/2022   Moderate persistent asthma without complication 01/75/1025   Asthma exacerbation 10/11/2021   Low back pain 10/11/2021   Abnormal CXR 07/17/2021   Aortic atherosclerosis ([place]) 07/05/2021   URI (upper respiratory infection) 07/05/2021   Arthrodesis status 06/20/2021   Drug allergy 05/14/2021   Acute cough 05/14/2021   Bilateral leg weakness 04/17/2021   Status post cervical spinal fusion 12/27/2020   History of stroke 12/11/2020   Medication management 10/21/2020   Body mass index (BMI) 26.0-26.9, adult 08/23/2020   Healthcare maintenance 08/11/2020   Cervical spondylosis with myelopathy and radiculopathy 07/28/2020   Preop exam for internal medicine 06/19/2020   Hypotension due to medication 06/11/2020   Status asthmaticus 06/09/2020   Essential (primary) hypertension 05/17/2020   Other spondylosis with myelopathy, cervical region 02/16/2020   Cervical spondylosis 02/02/2020   Radiculopathy, cervical region 02/02/2020   Spondylolysis, cervical region 02/02/2020   Neck pain 02/02/2020   Leg cramping 08/12/2019   Laryngopharyngeal reflux (LPR) 05/26/2019   Sinus pressure 05/26/2019   Hypophosphatemia 01/07/2019   Vertigo  09/23/2018   Dysfunction of left eustachian tube 09/23/2018   Gait disorder 04/21/2018   Leukocytosis 04/15/2018   Nausea & vomiting 04/15/2018   Seizure disorder ([place])    Prolapsed cervical intervertebral disc 03/11/2018   Cervical radiculopathy 02/18/2018   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   Syncope 09/04/2017   Constipation 09/04/2017   Nonintractable headache 09/04/2017   Leg swelling 07/26/2017   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   Hyperlipidemia 04/20/2017    Pedal edema 04/19/2017   Bipolar I disorder ([place]) 09/19/2016   Facial pain 07/12/2016   Rash 06/14/2016   Migraine 01/25/2016   Idiopathic urticaria/pruritus 01/23/2016   Allergic rhinitis 01/23/2016   Oral candidiasis 01/23/2016   Greater trochanteric bursitis of both hips 09/08/2015   Chest pain 06/22/2015   Itching 06/22/2015   Bilateral knee pain 06/22/2015   Bilateral hip pain 06/22/2015   Anemia, iron deficiency 12/22/2014   Fatigue 12/21/2014   Intertrigo 08/07/2014   Dyspareunia 07/12/2014   Menopausal syndrome (hot flushes) 07/12/2014   History of tobacco abuse 07/12/2014   Dizziness 06/22/2014   Weakness 06/22/2014   Fibromyalgia 05/14/2014   Encounter for well adult exam with abnormal findings 04/22/2014   Recurrent boils 04/22/2014   Recurrent falls 04/22/2014   Peripheral vascular disease ([place])    Depression    Anxiety    Gastroesophageal reflux disease    Prediabetes 02/23/2014   Screening mammogram for high-risk patient 02/23/2014   Back pain 07/22/2013   COPD/asthma 08/24/2009   Headache(784.0) 08/24/2009   Asthma-COPD overlap syndrome ([place]) 05/12/2009   PCP:  Biagio Borg, MD Pharmacy:   [facility] [place], [place] [address] Phone: [phone] Fax: [phone]  [place], [place] [address] Phone: [phone] Fax: [phone]  [facility] [address] [place] [address] Phone: [phone] Fax: [phone]     Social Determinants of Health (SDOH) Interventions    Readmission Risk Interventions     No data to display

## 2022-06-15 NOTE — Telephone Encounter (Signed)
They need to order BiPAP or NIV before she is discharged. Usually if they do that and get it set up, we don't have to do an outpatient sleep study. Thanks.

## 2022-06-15 NOTE — Assessment & Plan Note (Signed)
BP Readings from Last 3 Encounters:  06/15/22 126/75  06/12/22 138/70  06/07/22 (!) 148/86   Stable, pt to continue medical treatment hct 12.5 mg qd

## 2022-06-15 NOTE — Assessment & Plan Note (Signed)
Last vitamin D Lab Results  Component Value Date   VD25OH 23.94 (L) 05/18/2022   Low, reminded to start oral replacement

## 2022-06-15 NOTE — Plan of Care (Signed)

## 2022-06-15 NOTE — Progress Notes (Signed)
PROGRESS NOTE        PATIENT DETAILS Name: [name] Age: 61 y.o. Sex: female Date of Birth: 07/13/1961 Admit Date: 06/13/2022 Admitting Physician Clance Boll, MD BZJ:IRCV, [name], MD  Brief Summary: Patient is a 61 y.o.  female with recent hospitalization for COPD/overlap syndrome with exacerbation-presenting with cough/worsening shortness of breath-found to have COPD/asthma exacerbation (while on tapering oral prednisone) and admitted to the hospitalist service.   Significant events: 6/28>> admit to [facility] for COPD/asthma exacerbation-required BiPAP on initial presentation to the ED.  Significant studies: 6/28>> CXR: No PNA  Significant microbiology data: 6/28>> COVID PCR: Negative  Procedures: None  Consults: None  Subjective: Still wheezing but comfortable.  No major issues overnight.  Objective: Vitals: Blood pressure 122/83, pulse 64, temperature 97.6 F (36.4 C), temperature source Oral, resp. rate 17, SpO2 98 %.   Exam: Gen Exam:Alert awake-not in any distress HEENT:atraumatic, normocephalic Chest: Moving air-continues to have rhonchi. CVS:S1S2 regular Abdomen:soft non tender, non distended Extremities:no edema Neurology: Non focal Skin: no rash   Pertinent Labs/Radiology:    Latest Ref Rng & Units 06/14/2022    1:27 AM 06/13/2022   10:44 AM 06/13/2022   10:10 AM  CBC  WBC 4.0 - 10.5 K/uL 19.4   23.1   Hemoglobin 12.0 - 15.0 g/dL 8.9  10.2  9.9   Hematocrit 36.0 - 46.0 % 29.1  30.0  32.0   Platelets 150 - 400 K/uL 387   434     Lab Results  Component Value Date   NA 141 06/14/2022   K 3.6 06/14/2022   CL 104 06/14/2022   CO2 31 06/14/2022      Assessment/Plan: Acute hypoxic/hypercapnic respiratory failure due to COPD/asthma overlap syndrome with exacerbation: Improving-continue steroids/bronchodilators/PPI/antisinus therapy-appreciate PCCM input.  Leukocytosis: From recent steroid taper/ongoing  steroid use-no indication of infection-follow closely.  HTN: BP stable-continue HCTZ.  HLD: Continue.  Seizure disorder: Continue Tegretol  Normocytic anemia: Chronic issue-watch closely-follow as an outpatient  GERD: Continue PPI twice daily.  Allergic rhinitis/postnasal drip symptoms: Continue Flonase/Afrin/Claritin   BMI: Estimated body mass index is 27.92 kg/m as calculated from the following:   Height as of 06/12/22: '5\' 3"'$  (1.6 m).   Weight as of 06/12/22: 71.5 kg.   Code status:   Code Status: Full Code   DVT Prophylaxis: heparin injection 5,000 Units Start: 06/13/22 1530   Family Communication: None at bedside   Disposition Plan: Status is: Inpatient Remains inpatient appropriate because: Recurrent COPD exacerbation with hypoxia/hypercapnic respiratory failure not yet stable for discharge.  Will require several more days of hospitalization.   Planned Discharge Destination:Home   Diet: Diet Order             Diet Heart Room service appropriate? Yes; Fluid consistency: Thin  Diet effective now                     Antimicrobial agents: Anti-infectives (From admission, onward)    Start     Dose/Rate Route Frequency Ordered Stop   06/13/22 2000  azithromycin (ZITHROMAX) tablet 500 mg        500 mg Oral Daily-1800 06/13/22 1949 06/18/22 1759   06/13/22 1530  cefTRIAXone (ROCEPHIN) 1 g in sodium chloride 0.9 % 100 mL IVPB        1 g 200 mL/hr over 30 Minutes Intravenous  Every 24 hours 06/13/22 1523 06/18/22 1514        MEDICATIONS: Scheduled Meds:  atorvastatin  40 mg Oral Daily   azithromycin  500 mg Oral q1800   budesonide (PULMICORT) nebulizer solution  0.25 mg Nebulization BID   carbamazepine  400 mg Oral BID   fluticasone  2 spray Each Nare Daily   gabapentin  100 mg Oral TID   heparin  5,000 Units Subcutaneous Q8H   hydrochlorothiazide  12.5 mg Oral Daily   ipratropium-albuterol  3 mL Nebulization BID   loratadine  10 mg Oral Daily    methylPREDNISolone (SOLU-MEDROL) injection  40 mg Intravenous Q12H   montelukast  10 mg Oral QHS   oxymetazoline  1 spray Each Nare BID   pantoprazole  40 mg Oral BID   potassium chloride  10 mEq Oral Daily   Continuous Infusions:  cefTRIAXone (ROCEPHIN)  IV Stopped (06/14/22 1503)   PRN Meds:.acetaminophen **OR** acetaminophen, albuterol, cyclobenzaprine, ibuprofen, ondansetron **OR** ondansetron (ZOFRAN) IV, traZODone   I have personally reviewed following labs and imaging studies  LABORATORY DATA: CBC: Recent Labs  Lab 06/13/22 1010 06/13/22 1044 06/14/22 0127  WBC 23.1*  --  19.4*  NEUTROABS 14.0*  --   --   HGB 9.9* 10.2* 8.9*  HCT 32.0* 30.0* 29.1*  MCV 85.1  --  85.6  PLT 434*  --  387     Basic Metabolic Panel: Recent Labs  Lab 06/13/22 1010 06/13/22 1044 06/14/22 0127  NA 142 139 141  K 3.5 3.1* 3.6  CL 103  --  104  CO2 30  --  31  GLUCOSE 144*  --  120*  BUN 16  --  16  CREATININE 0.73  --  0.92  CALCIUM 9.1  --  8.7*     GFR: Estimated Creatinine Clearance: 62.4 mL/min (by C-G formula based on SCr of 0.92 mg/dL).  Liver Function Tests: Recent Labs  Lab 06/13/22 1010 06/14/22 0127  AST 20 17  ALT 27 22  ALKPHOS 67 59  BILITOT 0.3 0.3  PROT 6.0* 5.8*  ALBUMIN 3.6 3.3*    No results for input(s): "LIPASE", "AMYLASE" in the last 168 hours. No results for input(s): "AMMONIA" in the last 168 hours.  Coagulation Profile: No results for input(s): "INR", "PROTIME" in the last 168 hours.  Cardiac Enzymes: No results for input(s): "CKTOTAL", "CKMB", "CKMBINDEX", "TROPONINI" in the last 168 hours.  BNP (last 3 results) No results for input(s): "PROBNP" in the last 8760 hours.  Lipid Profile: No results for input(s): "CHOL", "HDL", "LDLCALC", "TRIG", "CHOLHDL", "LDLDIRECT" in the last 72 hours.  Thyroid Function Tests: No results for input(s): "TSH", "T4TOTAL", "FREET4", "T3FREE", "THYROIDAB" in the last 72 hours.  Anemia Panel: No  results for input(s): "VITAMINB12", "FOLATE", "FERRITIN", "TIBC", "IRON", "RETICCTPCT" in the last 72 hours.  Urine analysis:    Component Value Date/Time   COLORURINE YELLOW 05/18/2022 1603   APPEARANCEUR CLEAR 05/18/2022 1603   LABSPEC 1.015 05/18/2022 1603   PHURINE 6.0 05/18/2022 1603   GLUCOSEU NEGATIVE 05/18/2022 1603   HGBUR NEGATIVE 05/18/2022 1603   BILIRUBINUR NEGATIVE 05/18/2022 1603   KETONESUR NEGATIVE 05/18/2022 1603   PROTEINUR NEGATIVE 05/05/2021 1922   UROBILINOGEN 0.2 05/18/2022 1603   NITRITE NEGATIVE 05/18/2022 1603   LEUKOCYTESUR NEGATIVE 05/18/2022 1603    Sepsis Labs: Lactic Acid, Venous    Component Value Date/Time   LATICACIDVEN 1.0 04/17/2018 0343    MICROBIOLOGY: Recent Results (from the past 240 hour(s))  SARS Coronavirus  2 by RT PCR (hospital order, performed in [facility] hospital lab) *cepheid single result test* Anterior Nasal Swab     Status: None   Collection Time: 06/13/22 12:06 PM   Specimen: Anterior Nasal Swab  Result Value Ref Range Status   SARS Coronavirus 2 by RT PCR NEGATIVE NEGATIVE Final    Comment: (NOTE) SARS-CoV-2 target nucleic acids are NOT DETECTED.  The SARS-CoV-2 RNA is generally detectable in upper and lower respiratory specimens during the acute phase of infection. The lowest concentration of SARS-CoV-2 viral copies this assay can detect is 250 copies / mL. A negative result does not preclude SARS-CoV-2 infection and should not be used as the sole basis for treatment or other patient management decisions.  A negative result may occur with improper specimen collection / handling, submission of specimen other than nasopharyngeal swab, presence of viral mutation(s) within the areas targeted by this assay, and inadequate number of viral copies (<250 copies / mL). A negative result must be combined with clinical observations, patient history, and epidemiological information.  Fact Sheet for Patients:    https://www.patel.info/  Fact Sheet for Healthcare Providers: https://hall.com/  This test is not yet approved or  cleared by the [place] FDA and has been authorized for detection and/or diagnosis of SARS-CoV-2 by FDA under an Emergency Use Authorization (EUA).  This EUA will remain in effect (meaning this test can be used) for the duration of the COVID-19 declaration under Section 564(b)(1) of the Act, 21 U.S.C. section 360bbb-3(b)(1), unless the authorization is terminated or revoked sooner.  Performed at [facility], [place] [address] [place], [place] [address]     RADIOLOGY STUDIES/RESULTS: No results found.   LOS: 2 days   Oren Binet, MD  [facility]    To contact the attending provider between 7A-7P or the covering provider during after hours 7P-7A, please log into the web site [email or web address] and access using universal Blue password for that web site. If you do not have the password, please call the hospital operator.  06/15/2022, 2:21 PM

## 2022-06-15 NOTE — Care Management Important Message (Signed)
Important Message  Patient Details  Name: [name] MRN: [id number] Date of Birth: 1961/09/04   Medicare Important Message Given:  Yes     Orbie Pyo 06/15/2022, 4:23 PM

## 2022-06-15 NOTE — Patient Instructions (Signed)
Please take OTC Vitamin D3 at 2000 units per day, indefinitely,   Please continue all other medications as before, and refills have been done if requested.  Please have the pharmacy call with any other refills you may need.  Please keep your appointments with your specialists as you may have planned

## 2022-06-15 NOTE — Assessment & Plan Note (Signed)
Currently stable today, cont current albuterol inhaler prn

## 2022-06-16 ENCOUNTER — Other Ambulatory Visit: Payer: Self-pay

## 2022-06-16 ENCOUNTER — Encounter (HOSPITAL_COMMUNITY): Payer: Self-pay | Admitting: Internal Medicine

## 2022-06-16 DIAGNOSIS — K219 Gastro-esophageal reflux disease without esophagitis: Secondary | ICD-10-CM | POA: Diagnosis not present

## 2022-06-16 DIAGNOSIS — R569 Unspecified convulsions: Secondary | ICD-10-CM | POA: Diagnosis not present

## 2022-06-16 DIAGNOSIS — I10 Essential (primary) hypertension: Secondary | ICD-10-CM | POA: Diagnosis not present

## 2022-06-16 DIAGNOSIS — J441 Chronic obstructive pulmonary disease with (acute) exacerbation: Secondary | ICD-10-CM | POA: Diagnosis not present

## 2022-06-16 LAB — GLUCOSE, CAPILLARY
Glucose-Capillary: 139 mg/dL — ABNORMAL HIGH (ref 70–99)
Glucose-Capillary: 152 mg/dL — ABNORMAL HIGH (ref 70–99)

## 2022-06-16 NOTE — Plan of Care (Signed)

## 2022-06-16 NOTE — Progress Notes (Signed)
PROGRESS NOTE        PATIENT DETAILS Name: [name] Age: 61 y.o. Sex: female Date of Birth: [birth year] Admit Date: 06/13/2022 Admitting Physician Clance Boll, MD MOL:MBEM, [name], MD  Brief Summary: Patient is a 61 y.o.  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     Significant events: 6/28>> admit to [facility] for COPD/asthma exacerbation-required BiPAP on initial presentation to the ED.  Significant studies: 6/28>> CXR: No PNA  Significant microbiology data: 6/28>> COVID PCR: Negative  Procedures: None  Consults: None  Subjective: Overall better but not yet back to baseline still wheezing.  Objective: Vitals: Blood pressure 125/75, pulse 83, temperature 98.2 F (36.8 C), temperature source Oral, resp. rate (!) 21, SpO2 97 %.   Exam: Gen Exam:Alert awake-not in any distress HEENT:atraumatic, normocephalic Chest: Still with scattered rhonchi all over the CVS:S1S2 regular Abdomen:soft non tender, non distended Extremities:no edema Neurology: Non focal Skin: no rash   Pertinent Labs/Radiology:    Latest Ref Rng & Units 06/14/2022    1:27 AM 06/13/2022   10:44 AM 06/13/2022   10:10 AM  CBC  WBC 4.0 - 10.5 K/uL 19.4   23.1   Hemoglobin 12.0 - 15.0 g/dL 8.9  10.2  9.9   Hematocrit 36.0 - 46.0 % 29.1  30.0  32.0   Platelets 150 - 400 K/uL 387   434     Lab Results  Component Value Date   NA 141 06/14/2022   K 3.6 06/14/2022   CL 104 06/14/2022   CO2 31 06/14/2022      Assessment/Plan: Acute hypoxic/hypercapnic respiratory failure due to COPD/asthma overlap syndrome with exacerbation: Improving-but still wheezing/not yet at baseline-continue steroids/bronchodilators/PPI/antisinus therapy.  Exposure to allergens/marijuana (neighbor)  likely the cause of the recent exacerbations.  PCCM following with plans to perform a overnight pulse oximetry-arrange BiPAP for home use-before consideration of discharge.   Leukocytosis: From recent steroid taper/ongoing steroid use-no indication of infection-follow closely.  HTN: BP stable-continue HCTZ.  HLD: Continue.  Seizure disorder: Continue Tegretol  Normocytic anemia: Chronic issue-watch closely-follow as an outpatient  GERD: Continue PPI twice daily.  Allergic rhinitis/postnasal drip symptoms: Continue Flonase/Afrin/Claritin   BMI: Estimated body mass index is 27.92 kg/m as calculated from the following:   Height as of 06/12/22: '5\' 3"'$  (1.6 m).   Weight as of 06/12/22: 71.5 kg.   Code status:   Code Status: Full Code   DVT Prophylaxis: heparin injection 5,000 Units Start: 06/13/22 1530   Family Communication: None at bedside   Disposition Plan: Status is: Inpatient Remains inpatient appropriate because: Recurrent COPD exacerbation with hypoxia/hypercapnic respiratory failure not yet stable for discharge.  Possible home on 'Sunday/Monday depending on clinical improvement.   Planned Discharge Destination:Home   Diet: Diet Order             Diet Heart Room service appropriate? Yes; Fluid consistency: Thin  Diet effective now                     Antimicrobial agents: Anti-infectives (From admission, onward)    Start     Dose/Rate Route Frequency Ordered Stop   06/13/22 2000  azithromycin (ZITHROMAX) tablet 500 mg        50'$ 0 mg  Oral Daily-1800 06/13/22 1949 06/15/22 1701   06/13/22 1530  cefTRIAXone (ROCEPHIN) 1 g in sodium chloride 0.9 % 100 mL IVPB        1 g 200 mL/hr over 30 Minutes Intravenous Every 24 hours 06/13/22 1523 06/18/22 1514        MEDICATIONS: Scheduled Meds:  atorvastatin  40 mg Oral Daily   budesonide (PULMICORT) nebulizer solution  0.25 mg Nebulization BID   carbamazepine  400 mg Oral BID   fluticasone  2 spray Each Nare  Daily   gabapentin  100 mg Oral TID   heparin  5,000 Units Subcutaneous Q8H   hydrochlorothiazide  12.5 mg Oral Daily   ipratropium-albuterol  3 mL Nebulization BID   loratadine  10 mg Oral Daily   methylPREDNISolone (SOLU-MEDROL) injection  40 mg Intravenous Q12H   montelukast  10 mg Oral QHS   oxymetazoline  1 spray Each Nare BID   pantoprazole  40 mg Oral BID   potassium chloride  10 mEq Oral Daily   Continuous Infusions:  cefTRIAXone (ROCEPHIN)  IV Stopped (06/15/22 1602)   PRN Meds:.acetaminophen **OR** acetaminophen, albuterol, cyclobenzaprine, ibuprofen, ondansetron **OR** ondansetron (ZOFRAN) IV, traZODone   I have personally reviewed following labs and imaging studies  LABORATORY DATA: CBC: Recent Labs  Lab 06/13/22 1010 06/13/22 1044 06/14/22 0127  WBC 23.1*  --  19.4*  NEUTROABS 14.0*  --   --   HGB 9.9* 10.2* 8.9*  HCT 32.0* 30.0* 29.1*  MCV 85.1  --  85.6  PLT 434*  --  387     Basic Metabolic Panel: Recent Labs  Lab 06/13/22 1010 06/13/22 1044 06/14/22 0127  NA 142 139 141  K 3.5 3.1* 3.6  CL 103  --  104  CO2 30  --  31  GLUCOSE 144*  --  120*  BUN 16  --  16  CREATININE 0.73  --  0.92  CALCIUM 9.1  --  8.7*     GFR: Estimated Creatinine Clearance: 62.4 mL/min (by C-G formula based on SCr of 0.92 mg/dL).  Liver Function Tests: Recent Labs  Lab 06/13/22 1010 06/14/22 0127  AST 20 17  ALT 27 22  ALKPHOS 67 59  BILITOT 0.3 0.3  PROT 6.0* 5.8*  ALBUMIN 3.6 3.3*    No results for input(s): "LIPASE", "AMYLASE" in the last 168 hours. No results for input(s): "AMMONIA" in the last 168 hours.  Coagulation Profile: No results for input(s): "INR", "PROTIME" in the last 168 hours.  Cardiac Enzymes: No results for input(s): "CKTOTAL", "CKMB", "CKMBINDEX", "TROPONINI" in the last 168 hours.  BNP (last 3 results) No results for input(s): "PROBNP" in the last 8760 hours.  Lipid Profile: No results for input(s): "CHOL", "HDL", "LDLCALC",  "TRIG", "CHOLHDL", "LDLDIRECT" in the last 72 hours.  Thyroid Function Tests: No results for input(s): "TSH", "T4TOTAL", "FREET4", "T3FREE", "THYROIDAB" in the last 72 hours.  Anemia Panel: No results for input(s): "VITAMINB12", "FOLATE", "FERRITIN", "TIBC", "IRON", "RETICCTPCT" in the last 72 hours.  Urine analysis:    Component Value Date/Time   COLORURINE YELLOW 05/18/2022 1603   APPEARANCEUR CLEAR 05/18/2022 1603   LABSPEC 1.015 05/18/2022 1603   PHURINE 6.0 05/18/2022 1603   GLUCOSEU NEGATIVE 05/18/2022 1603   HGBUR NEGATIVE 05/18/2022 1603   BILIRUBINUR NEGATIVE 05/18/2022 1603   KETONESUR NEGATIVE 05/18/2022 1603   PROTEINUR NEGATIVE 05/05/2021 1922   UROBILINOGEN 0.2 05/18/2022 1603   NITRITE NEGATIVE 05/18/2022 1603   LEUKOCYTESUR NEGATIVE 05/18/2022 1603    Sepsis  Labs: Lactic Acid, Venous    Component Value Date/Time   LATICACIDVEN 1.0 04/17/2018 0343    MICROBIOLOGY: Recent Results (from the past 240 hour(s))  SARS Coronavirus 2 by RT PCR (hospital order, performed in [facility] hospital lab) *cepheid single result test* Anterior Nasal Swab     Status: None   Collection Time: 06/13/22 12:06 PM   Specimen: Anterior Nasal Swab  Result Value Ref Range Status   SARS Coronavirus 2 by RT PCR NEGATIVE NEGATIVE Final    Comment: (NOTE) SARS-CoV-2 target nucleic acids are NOT DETECTED.  The SARS-CoV-2 RNA is generally detectable in upper and lower respiratory specimens during the acute phase of infection. The lowest concentration of SARS-CoV-2 viral copies this assay can detect is 250 copies / mL. A negative result does not preclude SARS-CoV-2 infection and should not be used as the sole basis for treatment or other patient management decisions.  A negative result may occur with improper specimen collection / handling, submission of specimen other than nasopharyngeal swab, presence of viral mutation(s) within the areas targeted by this assay, and inadequate  number of viral copies (<250 copies / mL). A negative result must be combined with clinical observations, patient history, and epidemiological information.  Fact Sheet for Patients:   https://www.patel.info/  Fact Sheet for Healthcare Providers: https://hall.com/  This test is not yet approved or  cleared by the [place] FDA and has been authorized for detection and/or diagnosis of SARS-CoV-2 by FDA under an Emergency Use Authorization (EUA).  This EUA will remain in effect (meaning this test can be used) for the duration of the COVID-19 declaration under Section 564(b)(1) of the Act, 21 U.S.C. section 360bbb-3(b)(1), unless the authorization is terminated or revoked sooner.  Performed at [facility], [place] [address], [place], [place] [address]     RADIOLOGY STUDIES/RESULTS: No results found.   LOS: 3 days   Oren Binet, MD  [facility]    To contact the attending provider between 7A-7P or the covering provider during after hours 7P-7A, please log into the web site [email or web address] and access using universal [place] password for that web site. If you do not have the password, please call the hospital operator.  06/16/2022, 12:00 PM

## 2022-06-16 NOTE — Progress Notes (Signed)
Pt placed on overnight pulse oximetry.  Pt currently on RA Sp02 94%.  Will monitor pt and will add 02 if needed throughout the night

## 2022-06-16 NOTE — Progress Notes (Signed)
NAME:  [name], MRN:  [id number], DOB:  [birth year], LOS: 3 ADMISSION DATE:  06/13/2022, CONSULTATION DATE:  06/13/2022 REFERRING MD:  Dr. Sloan Leiter - [facility], CHIEF COMPLAINT: COPD/asthma exacerbation  History of Present Illness:  [name] is a 61 year old female with a past medical history significant for COPD/asthma overlap, former smoker, allergic rhinitis, anxiety, depression, GERD, hypertension, PVD, prediabetes, and seizures who presented to the ED 6/28 with acute respiratory distress.  Of note patient has had 3 admissions for COPD exacerbations since February 2023 with most recent admission being 05/27/2022 - 06/01/2022 at which time she was discharged on a prolonged steroid taper status post treatment of acute hypoxic and hypercapnic respiratory failure in the setting of COPD exacerbation.  Per patient she reports shortness of breath worsened after taking a walk with symptoms progressing until unable to manage at home.  On ED arrival patient was seen hemodynamically stable but required BIPAP initiation on EMS arrival. [name] was relatively unremarkable despite leukocytosis in the setting of steroid taper. PCCM ws consulted 06/15/2022.     Pertinent  Medical History  COPD/asthma overlap, former smoker, allergic rhinitis, anxiety, depression, GERD, hypertension, PVD, prediabetes, and seizures  Significant Hospital Events: Including procedures, antibiotic start and stop dates in addition to other pertinent events   6/28 admitted for recurrent COPD exacerbation. 6/30 stable on RA, PCCM consulted for assistance in management   Interim History / Subjective:  Reports having slept well through the night during the night  Objective   Blood pressure (!) 155/93, pulse 80, temperature 98.2 F (36.8 C), temperature source Oral, resp. rate 17, SpO2 93 %.       No intake or output data in the 24 hours ending 06/16/22 0807  There were no vitals filed for this  visit.  Examination: General: Well-appearing middle-aged female sitting up in bed in no acute distress HEENT: [place]/AT, MM pink/moist, PERRL,  Neuro: Alert and oriented x3, nonfocal CV: s1s2 regular rate and rhythm, no murmur, rubs, or gallops,  PULM: Faint bilateral expiratory wheeze with deep inspiration, on room air, no increased work of breathing GI: soft, bowel sounds active in all 4 quadrants, non-tender, non-distended, tolerating oral diet Extremities: warm/dry, no edema  Skin: no rashes or lesions  Resolved Hospital Problem list     Assessment & Plan:  Acute COPD-asthma exacerbation -This is the fourth admission for patient since February of this year.  Last admission being 6/11 through 6/16.  On extensive discussion with patient today at bedside she reports primary trigger for exacerbations being smells like perfume, cologne and especially smoke.  Patient states she has had a new neighbor move and beginning of this year (around the time the exacerbation started) that smokes marijuana on his balcony and will often filter down to her apartment.  Patient also reports she went for a walk at her apartment complex outside the day of admission.  Exposure to poor air quality and environmental exposures such as marijuana smoke is likely what triggered current exacerbation.  She reports compliance with her home Breztri and rescue inhaler and nebulizers as needed.  She denies any fever, cough, congestion or known sick exposures prior to admission  Plan Patient education on importance of avoiding triggers.  She has already communicated with her apartment manager regarding smoke exposure and is hopeful to move to new complex which she will be given a letter by pulmonary.  Continue bronchodilators Wean steroids Consider stopping antimicrobial therapy Pulmonary rehab Note her arterial blood gases with a  normal pH of 7.36 with an elevated PCO2 of 58 and a bicarbonate of 32.5 are consistent with OHS.   This may help qualify her for outpatient noninvasive chemical ventilatory support Her overnight pulse oximetry was not done for unknown reason. Questionable rescheduled tonight.    Best Practice (right click and "Reselect all SmartList Selections" daily)  Per primary  Labs   CBC: Recent Labs  Lab 06/13/22 1010 06/13/22 1044 06/14/22 0127  WBC 23.1*  --  19.4*  NEUTROABS 14.0*  --   --   HGB 9.9* 10.2* 8.9*  HCT 32.0* 30.0* 29.1*  MCV 85.1  --  85.6  PLT 434*  --  409    Basic Metabolic Panel: Recent Labs  Lab 06/13/22 1010 06/13/22 1044 06/14/22 0127  NA 142 139 141  K 3.5 3.1* 3.6  CL 103  --  104  CO2 30  --  31  GLUCOSE 144*  --  120*  BUN 16  --  16  CREATININE 0.73  --  0.92  CALCIUM 9.1  --  8.7*   GFR: Estimated Creatinine Clearance: 62.4 mL/min (by C-G formula based on SCr of 0.92 mg/dL). Recent Labs  Lab 06/13/22 1010 06/14/22 0127  WBC 23.1* 19.4*    Liver Function Tests: Recent Labs  Lab 06/13/22 1010 06/14/22 0127  AST 20 17  ALT 27 22  ALKPHOS 67 59  BILITOT 0.3 0.3  PROT 6.0* 5.8*  ALBUMIN 3.6 3.3*   No results for input(s): "LIPASE", "AMYLASE" in the last 168 hours. No results for input(s): "AMMONIA" in the last 168 hours.  ABG    Component Value Date/Time   PHART 7.359 06/13/2022 1044   PCO2ART 57.7 (H) 06/13/2022 1044   PO2ART 253 (H) 06/13/2022 1044   HCO3 32.5 (H) 06/13/2022 1044   TCO2 34 (H) 06/13/2022 1044   O2SAT 100 06/13/2022 1044     Coagulation Profile: No results for input(s): "INR", "PROTIME" in the last 168 hours.  Cardiac Enzymes: No results for input(s): "CKTOTAL", "CKMB", "CKMBINDEX", "TROPONINI" in the last 168 hours.  HbA1C: Hgb A1c MFr Bld  Date/Time Value Ref Range Status  05/18/2022 04:03 PM 6.3 4.6 - 6.5 % Final    Comment:    Glycemic Control Guidelines for People with Diabetes:Non Diabetic:  <6%Goal of Therapy: <7%Additional Action Suggested:  >8%   02/06/2022 06:01 PM 5.6 4.8 - 5.6 % Final     Comment:    (NOTE)         Prediabetes: 5.7 - 6.4         Diabetes: >6.4         Glycemic control for adults with diabetes: <7.0     CBG: Recent Labs  Lab 06/13/22 2226 06/14/22 2038 06/15/22 2104  GLUCAP 152* 169* 171*      Past Medical History:  She,  has a past medical history of Anemia, iron deficiency (12/22/2014), Anxiety, Arthritis, ASTHMA (05/12/2009), Bipolar disorder ([place]), COPD (chronic obstructive pulmonary disease) ([place]), Depression, Eczema (05/18/2022), Fibromyalgia (05/14/2014), GERD (gastroesophageal reflux disease), History of kidney stones, Hyperlipidemia (04/20/2017), HYPERTENSION (05/12/2009), Kidney stones, Peripheral vascular disease ([place]), Pneumonia, Prediabetes (02/23/2014), Seizure ([place]), Stroke ([place]) (11/2020), and Urticaria.   Surgical History:   Critical care time:  NA  Richardson Landry Kerah Hardebeck ACNP Acute Care Nurse Practitioner [place] Please consult Amion 06/16/2022, 8:07 AM

## 2022-06-17 DIAGNOSIS — J441 Chronic obstructive pulmonary disease with (acute) exacerbation: Secondary | ICD-10-CM | POA: Diagnosis not present

## 2022-06-17 MED ORDER — METHYLPREDNISOLONE SODIUM SUCC 40 MG IJ SOLR
40.0000 mg | INTRAMUSCULAR | Status: DC
Start: 2022-06-17 — End: 2022-06-17

## 2022-06-17 MED ORDER — ORAL CARE MOUTH RINSE: 15.0000 mL | OROMUCOSAL | Status: DC | PRN

## 2022-06-17 MED ORDER — PREDNISONE 5 MG PO TABS: ORAL_TABLET | ORAL | 0 refills | Status: DC

## 2022-06-17 NOTE — Progress Notes (Signed)
RT Note:  Overnight pulse study placed in patient chart.

## 2022-06-17 NOTE — Plan of Care (Signed)

## 2022-06-17 NOTE — Progress Notes (Signed)
NAME:  [name], MRN:  [id number], DOB:  1961/10/04, LOS: 4 ADMISSION DATE:  06/13/2022, CONSULTATION DATE:  06/13/2022 REFERRING MD:  Dr. Sloan Leiter - [facility], CHIEF COMPLAINT: COPD/asthma exacerbation  History of Present Illness:  [name] is a 61 year old female with a past medical history significant for COPD/asthma overlap, former smoker, allergic rhinitis, anxiety, depression, GERD, hypertension, PVD, prediabetes, and seizures who presented to the ED 6/28 with acute respiratory distress.  Of note patient has had 3 admissions for COPD exacerbations since February 2023 with most recent admission being 05/27/2022 - 06/01/2022 at which time she was discharged on a prolonged steroid taper status post treatment of acute hypoxic and hypercapnic respiratory failure in the setting of COPD exacerbation.  Per patient she reports shortness of breath worsened after taking a walk with symptoms progressing until unable to manage at home.  On ED arrival patient was seen hemodynamically stable but required BIPAP initiation on EMS arrival. [name] was relatively unremarkable despite leukocytosis in the setting of steroid taper. PCCM ws consulted 06/15/2022.     Pertinent  Medical History  COPD/asthma overlap, former smoker, allergic rhinitis, anxiety, depression, GERD, hypertension, PVD, prediabetes, and seizures  Significant Hospital Events: Including procedures, antibiotic start and stop dates in addition to other pertinent events   6/28 admitted for recurrent COPD exacerbation. 6/30 stable on RA, PCCM consulted for assistance in management   Interim History / Subjective:  Reports having slept well through the night during the night  Objective   Blood pressure 110/73, pulse 68, temperature 98.4 F (36.9 C), temperature source Oral, resp. rate 12, height '5\' 3"'$  (1.6 m), weight 71.5 kg, SpO2 96 %.        Intake/Output Summary (Last 24 hours) at 06/17/2022 1031 Last data filed  at 06/17/2022 0700 Gross per 24 hour  Intake 100 ml  Output --  Net 100 ml    Filed Weights   06/16/22 1413  Weight: 71.5 kg    Examination: General: Obese female in no acute distress HEENT: MM pink/moist no JVD Neuro: Grossly intact without focal defect CV: Distant  PULM: Decreased breath sounds   GI: soft, bsx4 active  GU: Voids Extremities: warm/dry, 1+ edema  Skin: no rashes or lesions   Resolved Hospital Problem list     Assessment & Plan:  Acute COPD-asthma exacerbation -This is the fourth admission for patient since February of this year.  Last admission being 6/11 through 6/16.  On extensive discussion with patient today at bedside she reports primary trigger for exacerbations being smells like perfume, cologne and especially smoke.  Patient states she has had a new neighbor move and beginning of this year (around the time the exacerbation started) that smokes marijuana on his balcony and will often filter down to her apartment.  Patient also reports she went for a walk at her apartment complex outside the day of admission.  Exposure to poor air quality and environmental exposures such as marijuana smoke is likely what triggered current exacerbation.  She reports compliance with her home Breztri and rescue inhaler and nebulizers as needed.  She denies any fever, cough, congestion or known sick exposures prior to admission  Plan Patient education on importance of avoiding triggers.  She has already communicated with her apartment manager regarding smoke exposure and is hopeful to move to new complex which she will be given a letter by pulmonary.  Her overnight desaturation study was completed and shows no significant desaturation   Continue bronchodilator dilators Wean  or stop steroids Consider stopping antimicrobial therapy Follow-up as an outpatient         Best Practice (right click and "Reselect all SmartList Selections" daily)  Per primary  Labs    CBC: Recent Labs  Lab 06/13/22 1010 06/13/22 1044 06/14/22 0127  WBC 23.1*  --  19.4*  NEUTROABS 14.0*  --   --   HGB 9.9* 10.2* 8.9*  HCT 32.0* 30.0* 29.1*  MCV 85.1  --  85.6  PLT 434*  --  245    Basic Metabolic Panel: Recent Labs  Lab 06/13/22 1010 06/13/22 1044 06/14/22 0127  NA 142 139 141  K 3.5 3.1* 3.6  CL 103  --  104  CO2 30  --  31  GLUCOSE 144*  --  120*  BUN 16  --  16  CREATININE 0.73  --  0.92  CALCIUM 9.1  --  8.7*   GFR: Estimated Creatinine Clearance: 62.4 mL/min (by C-G formula based on SCr of 0.92 mg/dL). Recent Labs  Lab 06/13/22 1010 06/14/22 0127  WBC 23.1* 19.4*    Liver Function Tests: Recent Labs  Lab 06/13/22 1010 06/14/22 0127  AST 20 17  ALT 27 22  ALKPHOS 67 59  BILITOT 0.3 0.3  PROT 6.0* 5.8*  ALBUMIN 3.6 3.3*   No results for input(s): "LIPASE", "AMYLASE" in the last 168 hours. No results for input(s): "AMMONIA" in the last 168 hours.  ABG    Component Value Date/Time   PHART 7.359 06/13/2022 1044   PCO2ART 57.7 (H) 06/13/2022 1044   PO2ART 253 (H) 06/13/2022 1044   HCO3 32.5 (H) 06/13/2022 1044   TCO2 34 (H) 06/13/2022 1044   O2SAT 100 06/13/2022 1044     Coagulation Profile: No results for input(s): "INR", "PROTIME" in the last 168 hours.  Cardiac Enzymes: No results for input(s): "CKTOTAL", "CKMB", "CKMBINDEX", "TROPONINI" in the last 168 hours.  HbA1C: Hgb A1c MFr Bld  Date/Time Value Ref Range Status  05/18/2022 04:03 PM 6.3 4.6 - 6.5 % Final    Comment:    Glycemic Control Guidelines for People with Diabetes:Non Diabetic:  <6%Goal of Therapy: <7%Additional Action Suggested:  >8%   02/06/2022 06:01 PM 5.6 4.8 - 5.6 % Final    Comment:    (NOTE)         Prediabetes: 5.7 - 6.4         Diabetes: >6.4         Glycemic control for adults with diabetes: <7.0     CBG: Recent Labs  Lab 06/13/22 2226 06/14/22 2038 06/15/22 2104 06/16/22 1140 06/16/22 1538  GLUCAP 152* 169* 171* 139* 152*        Surgical History:   Critical care time:  NA  Steve Tarisa Paola ACNP Acute Care Nurse Practitioner [place] Please consult Amion 06/17/2022, 10:31 AM

## 2022-06-17 NOTE — Discharge Instructions (Addendum)
Follow with Primary MD Biagio Borg, MD and your pulmonologist in 7 days   Get CBC, CMP, 2 view Chest X ray -  checked next visit within 1 week by Primary MD    Activity: As tolerated with Full fall precautions use walker/cane & assistance as needed  Disposition Home    Diet: Heart Healthy    Special Instructions: If you have smoked or chewed Tobacco  in the last 2 yrs please stop smoking, stop any regular Alcohol  and or any Recreational drug use.  On your next visit with your primary care physician please Get Medicines reviewed and adjusted.  Please request your Prim.MD to go over all Hospital Tests and Procedure/Radiological results at the follow up, please get all Hospital records sent to your Prim MD by signing hospital release before you go home.  If you experience worsening of your admission symptoms, develop shortness of breath, life threatening emergency, suicidal or homicidal thoughts you must seek medical attention immediately by calling 911 or calling your MD immediately  if symptoms less severe.  You Must read complete instructions/literature along with all the possible adverse reactions/side effects for all the Medicines you take and that have been prescribed to you. Take any new Medicines after you have completely understood and accpet all the possible adverse reactions/side effects.

## 2022-06-17 NOTE — Discharge Summary (Signed)
Michi Herrmann GQQ:[id number] DOB: 08-May-1961 DOA: 06/13/2022  PCP: Biagio Borg, MD  Admit date: 06/13/2022  Discharge date: 06/17/2022  Admitted From: Home   Disposition:  Home   Recommendations for Outpatient Follow-up:   Follow up with PCP in 1-2 weeks  PCP Please obtain BMP/CBC, 2 view CXR in 1week,  (see Discharge instructions)   PCP Please follow up on the following pending results: Must follow-up with her pulmonologist within a week   Home Health: None   Equipment/Devices: None  Consultations: PCCM Discharge Condition: Stable    CODE STATUS: Full    Diet Recommendation: Heart Healthy   Diet Order             Diet - low sodium heart healthy           Diet Heart Room service appropriate? Yes; Fluid consistency: Thin  Diet effective now                    Chief Complaint  Patient presents with   Respiratory Distress     Brief history of present illness from the day of admission and additional interim summary    61 y.o.  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       Significant events: 6/28>> admit to [facility] for COPD/asthma exacerbation-required BiPAP on initial presentation to the ED.   Significant studies: 6/28>> CXR: No PNA   Significant microbiology data: 6/28>> COVID PCR: Negative                                                                 Hospital Course    Acute hypoxic/hypercapnic respiratory failure due to COPD/asthma overlap syndrome with exacerbation: Improving-but still wheezing/not yet at baseline-continue steroids/bronchodilators/PPI/antisinus therapy.  Exposure to  allergens/marijuana (neighbor) likely the cause of the recent exacerbations.  He is currently symptom-free and stable on room air, seen by PCCM this morning, PCCM cleared for discharge overnight pulse oximetry study unremarkable per PCCM.  Will be discharged on oral steroid taper with outpatient follow-up with her PCP and primary pulmonologist within a week.   Leukocytosis: From recent steroid taper/ongoing steroid use-no indication of infection-follow closely.  HTN: BP stable-continue HCTZ.  HLD: Continue.  Seizure disorder: Continue Tegretol   Normocytic anemia: Chronic issue-watch closely-follow as an outpatient   GERD: Continue PPI twice daily.   Allergic rhinitis/postnasal drip symptoms: Continue Flonase/Afrin/Claritin   Discharge diagnosis     Active Problems:   COPD exacerbation [facility])    Discharge instructions    Discharge Instructions     Diet - low sodium heart healthy   Complete by: As directed    Discharge instructions   Complete by: As  directed    Follow with Primary MD Biagio Borg, MD and your pulmonologist in 7 days   Get CBC, CMP, 2 view Chest X ray -  checked next visit within 1 week by Primary MD    Activity: As tolerated with Full fall precautions use walker/cane & assistance as needed  Disposition Home    Diet: Heart Healthy    Special Instructions: If you have smoked or chewed Tobacco  in the last 2 yrs please stop smoking, stop any regular Alcohol  and or any Recreational drug use.  On your next visit with your primary care physician please Get Medicines reviewed and adjusted.  Please request your Prim.MD to go over all Hospital Tests and Procedure/Radiological results at the follow up, please get all Hospital records sent to your Prim MD by signing hospital release before you go home.  If you experience worsening of your admission symptoms, develop shortness of breath, life threatening emergency, suicidal or homicidal thoughts you must seek  medical attention immediately by calling 911 or calling your MD immediately  if symptoms less severe.  You Must read complete instructions/literature along with all the possible adverse reactions/side effects for all the Medicines you take and that have been prescribed to you. Take any new Medicines after you have completely understood and accpet all the possible adverse reactions/side effects.   Increase activity slowly   Complete by: As directed        Discharge Medications   Allergies as of 06/17/2022       Reactions   Belsomra [suvorexant] Other (See Comments)   hallucinations   Sulfa Antibiotics Nausea And Vomiting   Venofer [iron Sucrose] Hives        Medication List     STOP taking these medications    predniSONE 10 MG tablet Commonly known as: DELTASONE   predniSONE 20 MG tablet Commonly known as: DELTASONE Replaced by: predniSONE 5 MG tablet       TAKE these medications    albuterol 108 (90 Base) MCG/ACT inhaler Commonly known as: VENTOLIN HFA Inhale 2 puffs into the lungs every 4 (four) hours as needed for wheezing or shortness of breath.   albuterol (2.5 MG/3ML) 0.083% nebulizer solution Commonly known as: PROVENTIL Take 3 mLs (2.5 mg total) by nebulization every 6 (six) hours as needed for wheezing or shortness of breath.   atorvastatin 40 MG tablet Commonly known as: LIPITOR Take 1 tablet (40 mg total) by mouth daily. TAKE 1 TABLET(40 MG) BY MOUTH DAILY Strength: 40 mg What changed: additional instructions   Atrovent HFA 17 MCG/ACT inhaler Generic drug: ipratropium Inhale 2 puffs into the lungs every 6 (six) hours as needed for wheezing.   azelastine 0.05 % ophthalmic solution Commonly known as: OPTIVAR Place 1 drop into both eyes 2 (two) times daily.   Belsomra 15 MG Tabs Generic drug: Suvorexant Take 1 tablet by mouth at bedtime as needed (sleep).   Biotin 1000 MCG tablet Take 1,000 mcg by mouth daily.   Breztri Aerosphere 160-9-4.8  MCG/ACT Aero Generic drug: Budeson-Glycopyrrol-Formoterol Inhale 2 puffs into the lungs in the morning and at bedtime.   carbamazepine 200 MG tablet Commonly known as: TEGretol Take 2 tablets (400 mg total) by mouth 2 (two) times daily.   cetirizine 10 MG tablet Commonly known as: ZYRTEC Can take one tablet by mouth once daily if needed for runny nose. What changed:  how much to take how to take this when to take this reasons to take  this additional instructions   cyclobenzaprine 5 MG tablet Commonly known as: FLEXERIL TAKE 1 TABLET(5 MG) BY MOUTH THREE TIMES DAILY AS NEEDED FOR MUSCLE SPASMS What changed:  how much to take how to take this when to take this reasons to take this additional instructions   diclofenac Sodium 1 % Gel Commonly known as: VOLTAREN Apply 4 g topically 4 (four) times daily. What changed:  when to take this reasons to take this   Dupixent 300 MG/2ML prefilled syringe Generic drug: dupilumab Inject 300 mg into the skin every 28 (twenty-eight) days.   estradiol 0.5 MG tablet Commonly known as: ESTRACE Take 1 tablet (0.5 mg total) by mouth daily.   famotidine 20 MG tablet Commonly known as: PEPCID Take 20 mg by mouth daily as needed for heartburn or indigestion. OTC   fluticasone 50 MCG/ACT nasal spray Commonly known as: FLONASE Place 1 spray into both nostrils 2 (two) times daily as needed for allergies or rhinitis.   gabapentin 100 MG capsule Commonly known as: NEURONTIN Take 1 capsule (100 mg total) by mouth 3 (three) times daily.   hydrochlorothiazide 12.5 MG capsule Commonly known as: MICROZIDE Take 1 capsule (12.5 mg total) by mouth daily. TAKE 1 CAPSULE(12.5 MG) BY MOUTH DAILY Strength: 12.5 mg What changed: additional instructions   hydrOXYzine 25 MG capsule Commonly known as: VISTARIL Take 1 when angry and if not calmer in 1 hour repeat What changed:  how much to take how to take this when to take this reasons to take  this additional instructions   irbesartan 75 MG tablet Commonly known as: AVAPRO TAKE 1 TABLET(75 MG) BY MOUTH DAILY What changed: See the new instructions.   levocetirizine 5 MG tablet Commonly known as: XYZAL Take 1 tablet (5 mg total) by mouth every evening.   montelukast 10 MG tablet Commonly known as: SINGULAIR TAKE 1 TABLET(10 MG) BY MOUTH AT BEDTIME What changed: See the new instructions.   ondansetron 4 MG disintegrating tablet Commonly known as: Zofran ODT Take 1 tablet (4 mg total) by mouth every 8 (eight) hours as needed for nausea or vomiting.   pantoprazole 40 MG tablet Commonly known as: PROTONIX TAKE 1 TABLET BY MOUTH EVERY DAY What changed:  how much to take how to take this when to take this additional instructions   potassium chloride 10 MEQ tablet Commonly known as: KLOR-CON M Take 1 tablet (10 mEq total) by mouth daily.   predniSONE 5 MG tablet Commonly known as: DELTASONE Label  & dispense according to the schedule below. take 8 Pills PO for 3 days, 6 Pills PO for 3 days, 4 Pills PO for 3 days, 2 Pills PO for 3 days, 1 Pills PO for 3 days, 1/2 Pill  PO for 3 days then STOP. Total 65 pills. Replaces: predniSONE 20 MG tablet   traZODone 100 MG tablet Commonly known as: DESYREL Take 1 tablet (100 mg total) by mouth at bedtime as needed for sleep.   Vitamin D (Ergocalciferol) 1.25 MG (50000 UNIT) Caps capsule Commonly known as: DRISDOL Take 1 capsule (50,000 Units total) by mouth every 7 (seven) days.               Durable Medical Equipment  (From admission, onward)           Start     Ordered   06/15/22 1113  For home use only DME Bipap  Once       Question Answer Comment  Length of Need Lifetime  Bleed in oxygen (LPM) 0   Keep 02 saturation 90   Inspiratory pressure 10   Expiratory pressure 8      06/15/22 1122             Follow-up Information     Biagio Borg, MD. Schedule an appointment as soon as possible for a  visit in 1 week(s).   Specialties: Internal Medicine, Radiology Why: And with your pulmonologist within a week Contact information: [place] [address] [phone]                 Major procedures and Radiology Reports - PLEASE review detailed and final reports thoroughly  -        DG Chest Portable 1 View  Result Date: 06/13/2022 CLINICAL DATA:  Shortness of breath EXAM: PORTABLE CHEST 1 VIEW COMPARISON:  05/28/2022 FINDINGS: Stable heart size and vascularity. Minor basilar atelectasis. No focal pneumonia, collapse or consolidation. Negative for edema, effusion or pneumothorax. Trachea midline. Artifact overlies the right upper lung medially. Lower cervical fusion hardware noted. IMPRESSION: No active chest disease by plain radiography. Electronically Signed   By: Jerilynn Mages.  Shick M.D.   On: 06/13/2022 10:34   DG Chest Port 1 View  Result Date: 05/28/2022 CLINICAL DATA:  SOB, BIPAP EXAM: PORTABLE CHEST 1 VIEW COMPARISON:  05/01/2022 FINDINGS: The lungs are mildly hyperinflated, progressive since prior examination with flattening of the hemidiaphragms. The lungs are clear. No pneumothorax or pleural effusion. Cardiac size within normal limits. Pulmonary vascularity is normal. Cervical fusion hardware partially visualized. No acute bone abnormality. IMPRESSION: Mild pulmonary hyperinflation. Electronically Signed   By: Fidela Salisbury M.D.   On: 05/28/2022 00:12      Today   Subjective    Alisha Bacus today has no headache,no chest abdominal pain,no new weakness tingling or numbness, feels much better wants to go home today.     Objective   Blood pressure 110/73, pulse 68, temperature 98.4 F (36.9 C), temperature source Oral, resp. rate 12, height '5\' 3"'$  (1.6 m), weight 71.5 kg, SpO2 96 %.   Intake/Output Summary (Last 24 hours) at 06/17/2022 1044 Last data filed at 06/17/2022 0700 Gross per 24 hour  Intake 100 ml  Output --  Net 100 ml     Exam  Awake Alert, No new F.N deficits,    [place].AT,PERRAL Supple Neck,   Symmetrical Chest wall movement, Good air movement bilaterally, CTAB RRR,No Gallops,   +ve B.Sounds, Abd Soft, Non tender,  No Cyanosis, Clubbing or edema    Data Review   Recent Labs  Lab 06/13/22 1010 06/13/22 1044 06/14/22 0127  WBC 23.1*  --  19.4*  HGB 9.9* 10.2* 8.9*  HCT 32.0* 30.0* 29.1*  PLT 434*  --  387  MCV 85.1  --  85.6  MCH 26.3  --  26.2  MCHC 30.9  --  30.6  RDW 17.1*  --  17.2*  LYMPHSABS 6.4*  --   --   MONOABS 2.2*  --   --   EOSABS 0.1  --   --   BASOSABS 0.1  --   --     Recent Labs  Lab 06/13/22 1010 06/13/22 1044 06/14/22 0127  NA 142 139 141  K 3.5 3.1* 3.6  CL 103  --  104  CO2 30  --  31  GLUCOSE 144*  --  120*  BUN 16  --  16  CREATININE 0.73  --  0.92  CALCIUM 9.1  --  8.7*  AST 20  --  17  ALT 27  --  22  ALKPHOS 67  --  59  BILITOT 0.3  --  0.3  ALBUMIN 3.6  --  3.3*  BNP 26.0  --   --     Total Time in preparing paper work, data evaluation and todays exam - 33 minutes  Lala Lund M.D on 06/17/2022 at 10:44 AM  [facility]

## 2022-06-18 ENCOUNTER — Telehealth: Payer: Self-pay | Admitting: Emergency Medicine

## 2022-06-18 NOTE — Telephone Encounter (Signed)
Patient called requesting to set an appointment HFU with RB, and was told that [name] doesn't have an schedule until 8/9,and she can see a NP. Patient stated that " someone better get her an appointment with [name] because it's serious, and she's not playing with her lungs"  Patient was offered, and scheduled with NP. Patient inquired  " If NP's knows what they're doing" Patient then states she called all last week, and no nurse has called her back, and she was going to communicate to [name] that all we do is sit on on the computers, and look stupid" Patient was informed that she called on 6/8 and also got an call on 6/8 patient states that I was lying, and requested to speak to a Freight forwarder. Patient was informed that managers was out and provided her with both returned dates. Patient then became really upset accusing me of having an attitude, and stated she never wanted to speak to me ever again. Patient call was disconnected due to her hostility, and excessive use of profanity. Tried defuse situation, but was unable to.

## 2022-06-18 NOTE — Telephone Encounter (Signed)
I called and spoke with the pt. She expressed that she called to simply scheduled HFU with Dr Lamonte Sakai and the front staff was speaking to her in a very rude tone from the very beginning of the call. She says that she asked her to speak to her nicer and asked her name and then she hung up on her. I apologized to the pt. I scheduled her with RB for HFU on 06/26/22 at 1:30 pm and advised arrive by 1:15. Pt very appreciate for this appointment and the call back she received from me. Will discuss with Mel Almond when she returns to clinic.

## 2022-06-18 NOTE — Telephone Encounter (Signed)
Per patient's chart, NIV was ordered after discharge. Order was sent to [facility].

## 2022-06-20 ENCOUNTER — Ambulatory Visit (HOSPITAL_COMMUNITY): Payer: Medicare HMO | Admitting: Psychiatry

## 2022-06-20 ENCOUNTER — Telehealth: Payer: Self-pay

## 2022-06-20 NOTE — Telephone Encounter (Signed)
Transition Care Management Follow-up Telephone Call Date of discharge and from where: [facility] on 06/17/2022 Diagnosis: J44.1 COPD Exacerbation How have you been since you were released from the hospital? "Feeling okay" Any questions or concerns? No  Items Reviewed: Did the pt receive and understand the discharge instructions provided? Yes  Medications obtained and verified? Yes  Other? No  Any new allergies since your discharge? No  Dietary orders reviewed? Yes; Heart Healthy Do you have support at home? Yes ; sister and friend  Early and Equipment/Supplies: Were home health services ordered? no If so, what is the name of the agency? no  Has the agency set up a time to come to the patient's home? not applicable Were any new equipment or medical supplies ordered?  Yes: Nexux (to monitor breathing) What is the name of the medical supply agency? [facility] Were you able to get the supplies/equipment? yes Do you have any questions related to the use of the equipment or supplies? No  Functional Questionnaire: (I = Independent and D = Dependent) ADLs: I  Bathing/Dressing- I  Meal Prep- I  Eating- I  Maintaining continence- I  Transferring/Ambulation- I (has a walker and a cane if needed)  Managing Meds- I  Follow up appointments reviewed:  PCP Hospital f/u appt confirmed? Yes  Scheduled to see Cathlean Cower, MD on 0706/2023 @ 11:20 am. [facility] f/u appt confirmed? Yes  Scheduled to see [facility] on 06/26/2022 @ 1:30 pm. Are transportation arrangements needed? No  If their condition worsens, is the pt aware to call PCP or go to the Emergency Dept.? Yes Was the patient provided with contact information for the PCP's office or ED? Yes Was to pt encouraged to call back with questions or concerns? Yes

## 2022-06-21 ENCOUNTER — Ambulatory Visit (INDEPENDENT_AMBULATORY_CARE_PROVIDER_SITE_OTHER): Payer: Medicare HMO | Admitting: Internal Medicine

## 2022-06-21 ENCOUNTER — Encounter: Payer: Self-pay | Admitting: Internal Medicine

## 2022-06-21 ENCOUNTER — Ambulatory Visit (INDEPENDENT_AMBULATORY_CARE_PROVIDER_SITE_OTHER): Payer: Medicare HMO

## 2022-06-21 VITALS — BP 130/60 | HR 76 | Temp 98.0°F | Ht 63.0 in | Wt 156.0 lb

## 2022-06-21 DIAGNOSIS — J449 Chronic obstructive pulmonary disease, unspecified: Secondary | ICD-10-CM | POA: Diagnosis not present

## 2022-06-21 DIAGNOSIS — J441 Chronic obstructive pulmonary disease with (acute) exacerbation: Secondary | ICD-10-CM

## 2022-06-21 DIAGNOSIS — J45909 Unspecified asthma, uncomplicated: Secondary | ICD-10-CM | POA: Diagnosis not present

## 2022-06-21 LAB — BASIC METABOLIC PANEL
BUN: 31 mg/dL — ABNORMAL HIGH (ref 6–23)
CO2: 29 mEq/L (ref 19–32)
Calcium: 9.6 mg/dL (ref 8.4–10.5)
Chloride: 97 mEq/L (ref 96–112)
Creatinine, Ser: 0.8 mg/dL (ref 0.40–1.20)
GFR: 80.6 mL/min (ref >60.00)
Glucose, Bld: 132 mg/dL — ABNORMAL HIGH (ref 70–99)
Potassium: 4.5 mEq/L (ref 3.5–5.1)
Sodium: 134 mEq/L — ABNORMAL LOW (ref 135–145)

## 2022-06-21 LAB — CBC WITH DIFFERENTIAL/PLATELET
Basophils Absolute: 0.1 10*3/uL (ref 0.0–0.1)
Basophils Relative: 0.3 % (ref 0.0–3.0)
Eosinophils Absolute: 0 10*3/uL (ref 0.0–0.7)
Eosinophils Relative: 0 % (ref 0.0–5.0)
HCT: 32.2 % — ABNORMAL LOW (ref 36.0–46.0)
Hemoglobin: 10.1 g/dL — ABNORMAL LOW (ref 12.0–15.0)
Lymphocytes Relative: 8.6 % — ABNORMAL LOW (ref 12.0–46.0)
Lymphs Abs: 1.5 10*3/uL (ref 0.7–4.0)
MCHC: 31.4 g/dL (ref 30.0–36.0)
MCV: 81.8 fl (ref 78.0–100.0)
Monocytes Absolute: 0.4 10*3/uL (ref 0.1–1.0)
Monocytes Relative: 2.6 % — ABNORMAL LOW (ref 3.0–12.0)
Neutro Abs: 15 10*3/uL — ABNORMAL HIGH (ref 1.4–7.7)
Neutrophils Relative %: 88.5 % — ABNORMAL HIGH (ref 43.0–77.0)
Platelets: 472 10*3/uL — ABNORMAL HIGH (ref 150.0–400.0)
RBC: 3.93 Mil/uL (ref 3.87–5.11)
RDW: 17.9 % — ABNORMAL HIGH (ref 11.5–15.5)
WBC: 16.9 10*3/uL — ABNORMAL HIGH (ref 4.0–10.5)

## 2022-06-21 NOTE — Progress Notes (Signed)
Patient ID: [name], female   DOB: 04-Aug-1961, 61 y.o.   MRN: [id number]        Chief Complaint: follow up hospn with copd asthma exacerbation       HPI:  [name] is a 61 y.o. female here with above June 28 - July 2 with copd asthma exacerbation with acute hypoxic resp failure requiring bipap after pt states exposed to cologne that set her symptoms off again, tx with o2, IV steroid and recovered.  Was d/c on oral steroid tx , today here for f/u lab , cxr, and has f/u appt with pulm next wk as well.  Pt denies chest pain, increased sob or doe, wheezing, orthopnea, PND, increased LE swelling, palpitations, dizziness or syncope.   Pt denies polydipsia, polyuria, or new focal neuro s/s.    Pt denies fever, wt loss, night sweats, loss of appetite, or other constitutional symptoms    Transitional Care Management elements noted today: 1)  Date of D/C: as above 2)  Medication reconciliation:  done today at end visit 3)  Review of D/C summary or other information:  done today 4)  Review of need for f/u on pending diagnostic tests and treatments:  done today 5)  Review of need for Interaction with other providers who will assume or resume care of pt specific problems: done today 6)  Education of patient/family/guardian or caregiver: none needed       Wt Readings from Last 3 Encounters:  06/21/22 156 lb (70.8 kg)  06/16/22 157 lb 10.1 oz (71.5 kg)  06/12/22 157 lb 9.6 oz (71.5 kg)   BP Readings from Last 3 Encounters:  06/21/22 130/60  06/17/22 110/73  06/12/22 138/70         Past Medical History:  Diagnosis Date   Anemia, iron deficiency 12/22/2014   pt. 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      Bipolar disorder ([place])    COPD (chronic obstructive  pulmonary disease) ([place])    Depression    Eczema 05/18/2022   Fibromyalgia 05/14/2014   GERD (gastroesophageal reflux disease)    History of kidney stones    Hyperlipidemia 04/20/2017   HYPERTENSION 05/12/2009   Qualifier: Diagnosis of  By: Lenn Cal Deborra Medina), [name]     Kidney stones    Peripheral vascular disease ([place])    Pneumonia    Prediabetes 02/23/2014   pt. denies   Seizure ([place])    Stroke ([place]) 11/2020   Urticaria    Past Surgical History:  Procedure Laterality Date   ABDOMINAL HYSTERECTOMY     ANTERIOR CERVICAL DECOMP/DISCECTOMY FUSION N/A 07/28/2020   Procedure: ANTERIOR CERVICAL DECOMPRESSION/DISCECTOMY FUSION. INTERBODY PROTHESIS, PLATE/SCREWS CERVICAL THREE- CERVICAL FOUR, CERVICAL FOUR- CERVICAL FIVE;  Surgeon: Newman Pies, MD;  Location: [facility];  Service: Neurosurgery;  Laterality: N/A;   BACK SURGERY     COLONOSCOPY  12/20/2011   Procedure: COLONOSCOPY;  Surgeon: Landry Dyke, MD;  Location: [facility];  Service: Endoscopy;  Laterality: N/A;   COLONOSCOPY  03/05/2012   Procedure: COLONOSCOPY;  Surgeon: Landry Dyke, MD;  Location: [facility];  Service: Endoscopy;  Laterality: N/A;   DIAGNOSTIC LAPAROSCOPY     HEMORRHOID SURGERY     INCISE AND DRAIN ABCESS     KIDNEY STONE  SURGERY     NECK SURGERY     x 2 Dr Orinda Kenner   SPINE SURGERY  2019   TOE SURGERY     TUBAL LIGATION      reports that she quit smoking about 3 years ago. Her smoking use included cigarettes. She has a 9.00 pack-year smoking history. She has never used smokeless tobacco. She reports that she does not currently use alcohol. She reports current drug use. Drug: Marijuana. family history includes Alcohol abuse in her brother and father; Anemia in her daughter, sister, and sister; Anesthesia problems in her father; Asthma in her brother, mother, and sister; Brain cancer in her sister; COPD in her mother and sister; Colon cancer (age of onset: 18) in her father; Diabetes in her brother,  father, mother, sister, sister, sister, and sister; Heart disease in her brother, brother, mother, and sister; Hypertension in her brother, daughter, sister, sister, sister, sister, and sister; Kidney disease in her father; Sleep apnea in her brother. Allergies  Allergen Reactions   Belsomra [Suvorexant] Other (See Comments)    hallucinations   Sulfa Antibiotics Nausea And Vomiting   Venofer [Iron Sucrose] Hives   Current Outpatient Medications on File Prior to Visit  Medication Sig Dispense Refill   albuterol (PROVENTIL) (2.5 MG/3ML) 0.083% nebulizer solution Take 3 mLs (2.5 mg total) by nebulization every 6 (six) hours as needed for wheezing or shortness of breath. 120 mL 0   albuterol (VENTOLIN HFA) 108 (90 Base) MCG/ACT inhaler Inhale 2 puffs into the lungs every 4 (four) hours as needed for wheezing or shortness of breath. 18 g 11   atorvastatin (LIPITOR) 40 MG tablet Take 1 tablet (40 mg total) by mouth daily. TAKE 1 TABLET(40 MG) BY MOUTH DAILY Strength: 40 mg (Patient taking differently: Take 40 mg by mouth daily.) 90 tablet 3   azelastine (OPTIVAR) 0.05 % ophthalmic solution Place 1 drop into both eyes 2 (two) times daily. 6 mL 12   BELSOMRA 15 MG TABS Take 1 tablet by mouth at bedtime as needed (sleep).     Biotin 1000 MCG tablet Take 1,000 mcg by mouth daily.     Budeson-Glycopyrrol-Formoterol (BREZTRI AEROSPHERE) 160-9-4.8 MCG/ACT AERO Inhale 2 puffs into the lungs in the morning and at bedtime. 5.9 g 6   carbamazepine (TEGRETOL) 200 MG tablet Take 2 tablets (400 mg total) by mouth 2 (two) times daily. 120 tablet 12   cetirizine (ZYRTEC) 10 MG tablet Can take one tablet by mouth once daily if needed for runny nose. (Patient taking differently: Take 10 mg by mouth daily as needed for rhinitis.) 30 tablet 5   cyclobenzaprine (FLEXERIL) 5 MG tablet TAKE 1 TABLET(5 MG) BY MOUTH THREE TIMES DAILY AS NEEDED FOR MUSCLE SPASMS (Patient taking differently: Take 5 mg by mouth 3 (three) times  daily as needed for muscle spasms.) 60 tablet 5   diclofenac Sodium (VOLTAREN) 1 % GEL Apply 4 g topically 4 (four) times daily. (Patient taking differently: Apply 4 g topically 4 (four) times daily as needed (pain).) 100 g 0   DUPIXENT 300 MG/2ML prefilled syringe Inject 300 mg into the skin every 28 (twenty-eight) days.     estradiol (ESTRACE) 0.5 MG tablet Take 1 tablet (0.5 mg total) by mouth daily. 90 tablet 4   famotidine (PEPCID) 20 MG tablet Take 20 mg by mouth daily as needed for heartburn or indigestion. OTC     fluticasone (FLONASE) 50 MCG/ACT nasal spray Place 1 spray into both nostrils 2 (two)  times daily as needed for allergies or rhinitis. 16 g 5   gabapentin (NEURONTIN) 100 MG capsule Take 1 capsule (100 mg total) by mouth 3 (three) times daily. 90 capsule 3   hydrochlorothiazide (MICROZIDE) 12.5 MG capsule Take 1 capsule (12.5 mg total) by mouth daily. TAKE 1 CAPSULE(12.5 MG) BY MOUTH DAILY Strength: 12.5 mg (Patient taking differently: Take 12.5 mg by mouth daily.) 90 capsule 3   hydrOXYzine (VISTARIL) 25 MG capsule Take 1 when angry and if not calmer in 1 hour repeat (Patient taking differently: Take 25 mg by mouth every 6 (six) hours as needed for anxiety.) 45 capsule 6   ipratropium (ATROVENT HFA) 17 MCG/ACT inhaler Inhale 2 puffs into the lungs every 6 (six) hours as needed for wheezing. 1 each 12   irbesartan (AVAPRO) 75 MG tablet TAKE 1 TABLET(75 MG) BY MOUTH DAILY (Patient taking differently: Take 75 mg by mouth daily.) 90 tablet 1   levocetirizine (XYZAL) 5 MG tablet Take 1 tablet (5 mg total) by mouth every evening. 30 tablet 5   montelukast (SINGULAIR) 10 MG tablet TAKE 1 TABLET(10 MG) BY MOUTH AT BEDTIME (Patient taking differently: Take 10 mg by mouth at bedtime.) 30 tablet 0   ondansetron (ZOFRAN ODT) 4 MG disintegrating tablet Take 1 tablet (4 mg total) by mouth every 8 (eight) hours as needed for nausea or vomiting. 20 tablet 0   pantoprazole (PROTONIX) 40 MG tablet  TAKE 1 TABLET BY MOUTH EVERY DAY (Patient taking differently: Take 40 mg by mouth daily.) 90 tablet 1   potassium chloride (KLOR-CON M) 10 MEQ tablet Take 1 tablet (10 mEq total) by mouth daily. 90 tablet 3   predniSONE (DELTASONE) 5 MG tablet Label  & dispense according to the schedule below. take 8 Pills PO for 3 days, 6 Pills PO for 3 days, 4 Pills PO for 3 days, 2 Pills PO for 3 days, 1 Pills PO for 3 days, 1/2 Pill  PO for 3 days then STOP. Total 65 pills. 65 tablet 0   traZODone (DESYREL) 100 MG tablet Take 1 tablet (100 mg total) by mouth at bedtime as needed for sleep. 90 tablet 1   Vitamin D, Ergocalciferol, (DRISDOL) 1.25 MG (50000 UNIT) CAPS capsule Take 1 capsule (50,000 Units total) by mouth every 7 (seven) days. 5 capsule 0   Current Facility-Administered Medications on File Prior to Visit  Medication Dose Route Frequency Provider Last Rate Last Admin   dupilumab (DUPIXENT) prefilled syringe 300 mg  300 mg Subcutaneous Q14 Days Valentina Shaggy, MD   300 mg at 06/06/22 1452        ROS:  All others reviewed and negative.  Objective        PE:  BP 130/60 (BP Location: Left Arm, Patient Position: Sitting, Cuff Size: Large)   Pulse 76   Temp 98 F (36.7 C) (Oral)   Ht '5\' 3"'$  (1.6 m)   Wt 156 lb (70.8 kg)   SpO2 98%   BMI 27.63 kg/m                 Constitutional: Pt appears in NAD               HENT: Head: NCAT.                Right Ear: External ear normal.                 Left Ear: External ear normal.  Eyes: . Pupils are equal, round, and reactive to light. Conjunctivae and EOM are normal               Nose: without d/c or deformity               Neck: Neck supple. Gross normal ROM               Cardiovascular: Normal rate and regular rhythm.                 Pulmonary/Chest: Effort normal and breath sounds without rales or wheezing.                Abd:  Soft, NT, ND, + BS, no organomegaly               Neurological: Pt is alert. At baseline  orientation, motor grossly intact               Skin: Skin is warm. No rashes, no other new lesions, LE edema - none               Psychiatric: Pt behavior is normal without agitation   Micro: none  Cardiac tracings I have personally interpreted today:  none  Pertinent Radiological findings (summarize): none   Lab Results  Component Value Date   WBC 16.9 (H) 06/21/2022   HGB 10.1 (L) 06/21/2022   HCT 32.2 (L) 06/21/2022   PLT 472.0 (H) 06/21/2022   GLUCOSE 132 (H) 06/21/2022   CHOL 186 05/18/2022   TRIG 55.0 05/18/2022   HDL 96.00 05/18/2022   LDLCALC 79 05/18/2022   ALT 22 06/14/2022   AST 17 06/14/2022   NA 134 (L) 06/21/2022   K 4.5 06/21/2022   CL 97 06/21/2022   CREATININE 0.80 06/21/2022   BUN 31 (H) 06/21/2022   CO2 29 06/21/2022   TSH 0.20 (L) 05/18/2022   INR 0.9 02/06/2022   HGBA1C 6.3 05/18/2022   MICROALBUR 0.9 04/11/2021   Assessment/Plan:  Jasani Lengel is a 61 y.o. Black or African American [2] female with  has a past medical history of Anemia, iron deficiency (12/22/2014), Anxiety, Arthritis, ASTHMA (05/12/2009), Bipolar disorder ([place]), COPD (chronic obstructive pulmonary disease) ([place]), Depression, Eczema (05/18/2022), Fibromyalgia (05/14/2014), GERD (gastroesophageal reflux disease), History of kidney stones, Hyperlipidemia (04/20/2017), HYPERTENSION (05/12/2009), Kidney stones, Peripheral vascular disease ([place]), Pneumonia, Prediabetes (02/23/2014), Seizure ([place]), Stroke ([place]) (11/2020), and Urticaria.  COPD/asthma Improved now stable again; for cxr and cbc bmp as recommended, cont current med tx, and f/u pulmonary next wk as planned   Followup: Return in about 6 months (around 12/22/2022).  Cathlean Cower, MD 06/21/2022 9:43 PM [facility]

## 2022-06-21 NOTE — Assessment & Plan Note (Signed)
Improved now stable again; for cxr and cbc bmp as recommended, cont current med tx, and f/u pulmonary next wk as planned

## 2022-06-21 NOTE — Patient Instructions (Signed)
Please continue all other medications as before, and refills have been done if requested.  Please have the pharmacy call with any other refills you may need.  Please keep your appointments with your specialists as you may have planned  Please go to the XRAY Department in the first floor for the x-ray testing  Please go to the LAB at the blood drawing area for the tests to be done  You will be contacted by phone if any changes need to be made immediately.  Otherwise, you will receive a letter about your results with an explanation, but please check with MyChart first.  Please remember to sign up for MyChart if you have not done so, as this will be important to you in the future with finding out test results, communicating by private email, and scheduling acute appointments online when needed.  Please make an Appointment to return in 6 months, or sooner if needed

## 2022-06-26 ENCOUNTER — Encounter: Payer: Self-pay | Admitting: Emergency Medicine

## 2022-06-26 ENCOUNTER — Ambulatory Visit (INDEPENDENT_AMBULATORY_CARE_PROVIDER_SITE_OTHER): Payer: Medicare HMO

## 2022-06-26 ENCOUNTER — Ambulatory Visit (INDEPENDENT_AMBULATORY_CARE_PROVIDER_SITE_OTHER): Payer: Medicare HMO | Admitting: Emergency Medicine

## 2022-06-26 ENCOUNTER — Telehealth: Payer: Self-pay | Admitting: Allergy & Immunology

## 2022-06-26 DIAGNOSIS — J4550 Severe persistent asthma, uncomplicated: Secondary | ICD-10-CM

## 2022-06-26 DIAGNOSIS — J441 Chronic obstructive pulmonary disease with (acute) exacerbation: Secondary | ICD-10-CM | POA: Diagnosis not present

## 2022-06-26 DIAGNOSIS — J9612 Chronic respiratory failure with hypercapnia: Secondary | ICD-10-CM | POA: Diagnosis not present

## 2022-06-26 DIAGNOSIS — J9611 Chronic respiratory failure with hypoxia: Secondary | ICD-10-CM | POA: Diagnosis not present

## 2022-06-26 DIAGNOSIS — J309 Allergic rhinitis, unspecified: Secondary | ICD-10-CM | POA: Diagnosis not present

## 2022-06-26 DIAGNOSIS — K219 Gastro-esophageal reflux disease without esophagitis: Secondary | ICD-10-CM

## 2022-06-26 NOTE — Progress Notes (Signed)
Subjective:    Patient ID: [name], female    DOB: 01-06-1961, 61 y.o.   MRN: [id number]  COPD She complains of cough, shortness of breath and wheezing. Associated symptoms include chest pain. Pertinent negatives include no fever, postnasal drip or rhinorrhea. Her past medical history is significant for COPD.    ROV 05/10/22 --61 year old woman with significant allergic rhinitis, associated asthmatic COPD, seizures versus pseudoseizures.  She has been treated with Dupixent, is on prednisone taper currently, Xyzal, Singulair, zyrtec. He was admitted 04/30/2022 with progressive exertional dyspnea.  She was treated for an acute exacerbation of her asthmatic COPD, had documented hypercapnia in the ED.  She required BiPAP support in the emergency department.  Her RVP was negative.  She had nocturnal hypoxemia in the hospital, question due to OSA superimposed on her obstructive lung disease She reports that she is doing better, is taking breztri reliably. Rare albuterol use as long as she is on the breztri. She is on immunotherapy, gets her dupixent from [facility].   Hosp F/u Visit 06/26/22 --[name] is 61 and has a history of tobacco use, asthmatic COPD, seizures versus pseudoseizures, allergic rhinitis.  Based on exam there may be a component of upper airway irritation syndrome contributing to her respiratory limitation as well.  Has been managed on Dupixent, chronic prednisone - off currently.  Also Xyzal, Singulair.  She has been admitted twice since last time I saw her in May, both times for acute on chronic hypercapnic respiratory failure for which she required BiPAP support.  She was able to be qualified for home BiPAP with a PCO2 57 done on 06/13/2022. She reports that she is feeling better although has low energy. She has exertional SOB. She continues to have labile voice quality with hoarseness. Loses her voice intermittently. No albuterol use since she got out of the hospital.  Currently on pred '15mg'$  w plan to go to zero. She is getting used to BiPAP, believes that it has helped her breathing. She is on breztri. On flonase, xyzal, singulair. She is on ppi qd, has breakthrough reflux on this.     Review of Systems  Constitutional:  Positive for fatigue. Negative for chills and fever.  HENT:  Negative for postnasal drip, rhinorrhea and sinus pressure.   Respiratory:  Positive for cough, shortness of breath and wheezing.   Cardiovascular:  Positive for chest pain. Negative for palpitations and leg swelling.       Objective:   Physical Exam Vitals:   06/26/22 1333  BP: 130/74  Pulse: 71  Temp: 98.3 F (36.8 C)  TempSrc: Oral  SpO2: 95%  Weight: 160 lb 3.2 oz (72.7 kg)  Height: '5\' 3"'$  (1.6 m)   Gen: Pleasant, well-nourished, in no distress,  normal affect  ENT: No lesions,  mouth clear,  oropharynx clear, no postnasal drip  Neck: No JVD, no stridor  Lungs: No use of accessory muscles, very distant, no wheezing or abnormal breath.  She does have prolonged expiration and some soft bilateral wheeze on forced expiration.  Cardiovascular: RRR, heart sounds normal, no murmur or gallops, no peripheral edema  Musculoskeletal: No deformities, no cyanosis or clubbing  Neuro: alert, non focal  Skin: Warm, no lesions or rashes      Assessment & Plan:   Chronic respiratory failure with hypoxia and hypercapnia (HCC) She was able to requalify for BiPAP.  She is wearing it reliably and does feel that she is getting clinical benefit.  Hopefully this will  keep her from requiring recurrent admissions.  Encouraged her to continue  COPD/asthma Plan to continue Breztri, Dupixent which is likely treating allergies as well as asthma.  Continue supplemental albuterol.  She is weaning prednisone with a goal to get to 0.  She will need to be seen soon after to ensure stability.  She has required maintenance prednisone in the past.  Allergic rhinitis Continue Xyzal,  Singulair and Flonase nasal spray Continue Dupixent Follow with APP in 1 month Follow with Dr. Lamonte Sakai in 3 months, sooner if problems  Gastroesophageal reflux disease This is a contributor to her poor asthma control.  May need to increase her PPI to twice daily if she has continued breakthrough  Baltazar Apo, MD, PhD 06/26/2022, 1:51 PM [facility] [phone] or if no answer [phone]

## 2022-06-26 NOTE — Assessment & Plan Note (Signed)
Continue Xyzal, Singulair and Flonase nasal spray Continue Dupixent Follow with APP in 1 month Follow with Dr. Lamonte Sakai in 3 months, sooner if problems

## 2022-06-26 NOTE — Assessment & Plan Note (Signed)
Plan to continue Breztri, Dupixent which is likely treating allergies as well as asthma.  Continue supplemental albuterol.  She is weaning prednisone with a goal to get to 0.  She will need to be seen soon after to ensure stability.  She has required maintenance prednisone in the past.

## 2022-06-26 NOTE — Assessment & Plan Note (Signed)
This is a contributor to her poor asthma control.  May need to increase her PPI to twice daily if she has continued breakthrough

## 2022-06-26 NOTE — Assessment & Plan Note (Signed)
She was able to requalify for BiPAP.  She is wearing it reliably and does feel that she is getting clinical benefit.  Hopefully this will keep her from requiring recurrent admissions.  Encouraged her to continue

## 2022-06-26 NOTE — Telephone Encounter (Signed)
Patient is requesting samples of breztri. She has an injection appointment today. Please advise.

## 2022-06-26 NOTE — Patient Instructions (Addendum)
Please continue to taper your prednisone down to 0 as planned. Continue Breztri 2 puffs twice a day.  Rinse and gargle after using. Keep your albuterol available to use either 1 nebulizer treatment or 2 puffs as needed for shortness of breath, chest tightness, wheezing. Continue your BiPAP every night while sleeping. Continue Xyzal, Singulair and Flonase nasal spray Continue Dupixent Continue pantoprazole once daily.  If you continue to have breakthrough reflux we may need to increase this to twice a day. Follow with APP in 1 month Follow with Dr. Lamonte Sakai in 3 months, sooner if problems

## 2022-06-27 ENCOUNTER — Ambulatory Visit: Payer: Medicare HMO

## 2022-06-28 ENCOUNTER — Inpatient Hospital Stay: Payer: Medicare HMO | Admitting: Nurse Practitioner

## 2022-07-03 ENCOUNTER — Ambulatory Visit (INDEPENDENT_AMBULATORY_CARE_PROVIDER_SITE_OTHER): Payer: Medicare HMO

## 2022-07-03 DIAGNOSIS — J309 Allergic rhinitis, unspecified: Secondary | ICD-10-CM | POA: Diagnosis not present

## 2022-07-09 ENCOUNTER — Ambulatory Visit: Payer: Medicare HMO | Admitting: Nurse Practitioner

## 2022-07-10 ENCOUNTER — Ambulatory Visit: Payer: Self-pay

## 2022-07-10 ENCOUNTER — Encounter: Payer: Self-pay | Admitting: Allergy & Immunology

## 2022-07-10 ENCOUNTER — Ambulatory Visit (INDEPENDENT_AMBULATORY_CARE_PROVIDER_SITE_OTHER): Payer: Medicare HMO | Admitting: Allergy & Immunology

## 2022-07-10 ENCOUNTER — Ambulatory Visit: Payer: Medicare HMO

## 2022-07-10 VITALS — BP 114/60 | HR 83 | Temp 97.9°F | Resp 16 | Wt 163.8 lb

## 2022-07-10 DIAGNOSIS — J449 Chronic obstructive pulmonary disease, unspecified: Secondary | ICD-10-CM | POA: Diagnosis not present

## 2022-07-10 DIAGNOSIS — J3089 Other allergic rhinitis: Secondary | ICD-10-CM | POA: Diagnosis not present

## 2022-07-10 DIAGNOSIS — J4489 Other specified chronic obstructive pulmonary disease: Secondary | ICD-10-CM

## 2022-07-10 DIAGNOSIS — L508 Other urticaria: Secondary | ICD-10-CM | POA: Diagnosis not present

## 2022-07-10 DIAGNOSIS — K219 Gastro-esophageal reflux disease without esophagitis: Secondary | ICD-10-CM

## 2022-07-10 MED ORDER — METHYLPREDNISOLONE ACETATE 40 MG/ML IJ SUSP
40.0000 mg | Freq: Once | INTRAMUSCULAR | Status: AC
  Administered 2022-07-10: 40 mg via INTRAMUSCULAR

## 2022-07-10 NOTE — Progress Notes (Signed)
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 chest CT next visit)   Recurrent admissions for breathing issues with hypercapnia and hypoxia- thought to be secondary to untreated obstructive sleep apnea (now on BiPAP with good results)  Normal chest CT (March 2023)    Perennial allergic rhinitis (dust mites) - on allergen immunotherapy   Chronic urticaria - resumed following patient's cessation of antihistamines (adding them back on today)   Recurrent infections - with largely normal workup aside from non-protective Pneumococcal titers and non-protective Diptheria titers   Plan/Recommendations:   1. Asthma-COPD overlap syndrome - Lung testing not done today (we will do that next week when you have a regular follow up visit). - I hope that the BiPAP will help decrease your admissions to the hospital. - Daily controller medication(s): Breztri 166mg two puffs twice daily with spacer + Dupixent every two weeks - Prior to physical activity: Proventil (ORANAGE INHALER) 2 puffs or Ventolin (GRAY INHALER) 2 puffs 10-15 minutes before physical activity. - Rescue medications: Proventil (ORANAGE INHALER) 2 puffs or Ventolin (GRAY INHALER) 2 puffs AND 2 puffs Atrovent (GREEN INHALER) every 4-6 hours as needed - Asthma control goals:  * Full participation in all desired activities (may need albuterol before activity) * Albuterol use two time or less a week on average (not counting use with activity) * Cough interfering with sleep two time or less a month * Oral steroids no more than once a year * No hospitalizations   2. Chronic urticaria - with current flare (restarting antihistamines) - Steroid injection given today. - Restart with levocetirizine twice daily. - Restart with famotidine twice daily. - Restart with Doxepin '25mg'$  at night to help with itching.   3. Mixed rhinitis rhinitis with  ear fullness and congestion (dust mites)  - Continue with Flonase 1 spray twice daily. - Continue with allergy shots at the same schedule.   4. Follow up in one month at your regular appointment or earlier if needed.      Subjective:   [name] a 61y.o. female presenting today for follow up of  Chief Complaint  Patient presents with   Pruritis    Been itching for 3 days. All over body cause unknown. Took a whole bottle of benadryl since it started and nothing has helped. Woke up this morning with both eyes puffed up.   Other    Admitted to the hospital 3 times this year May, June, and July    [facility] of the following: Patient Active Problem List   Diagnosis Date Noted   PND (paroxysmal nocturnal dyspnea) 06/07/2022   Vitamin D deficiency 05/20/2022   Low vitamin D level 05/20/2022   Eczema 05/18/2022   Insomnia 05/18/2022   Allergic conjunctivitis 05/18/2022   Chronic respiratory failure with hypoxia and hypercapnia ([place] 04/30/2022   Bilateral hearing loss 04/14/2022   Moderate persistent asthma without complication 006/26/9485  Low back pain 10/11/2021   Abnormal CXR 07/17/2021   Aortic atherosclerosis ([place] 07/05/2021   URI (upper respiratory infection) 07/05/2021   Arthrodesis status 06/20/2021   Drug allergy 05/14/2021   Acute cough 05/14/2021   Bilateral leg weakness 04/17/2021   Status post cervical spinal fusion 12/27/2020   History of stroke 12/11/2020   Medication management 10/21/2020   Body mass index (BMI) 26.0-26.9, adult 08/23/2020   Healthcare maintenance 08/11/2020   Cervical spondylosis  with myelopathy and radiculopathy 07/28/2020   Preop exam for internal medicine 06/19/2020   Hypotension due to medication 06/11/2020   Status asthmaticus 06/09/2020   Essential (primary) hypertension 05/17/2020   Other spondylosis with myelopathy, cervical region 02/16/2020   Cervical spondylosis 02/02/2020    Radiculopathy, cervical region 02/02/2020   Spondylolysis, cervical region 02/02/2020   Neck pain 02/02/2020   Leg cramping 08/12/2019   Laryngopharyngeal reflux (LPR) 05/26/2019   Sinus pressure 05/26/2019   Hypophosphatemia 01/07/2019   Vertigo 09/23/2018   Dysfunction of left eustachian tube 09/23/2018   Gait disorder 04/21/2018   Leukocytosis 04/15/2018   Nausea & vomiting 04/15/2018   Seizure disorder ([place])    Prolapsed cervical intervertebral disc 03/11/2018   Cervical radiculopathy 02/18/2018   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   Syncope 09/04/2017   Constipation 09/04/2017   Nonintractable headache 09/04/2017   Leg swelling 07/26/2017   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   Hyperlipidemia 04/20/2017   Pedal edema 04/19/2017   Bipolar I disorder ([place]) 09/19/2016   Facial pain 07/12/2016   Rash 06/14/2016   Migraine 01/25/2016   Idiopathic urticaria/pruritus 01/23/2016   Allergic rhinitis 01/23/2016   Oral candidiasis 01/23/2016   Greater trochanteric bursitis of both hips 09/08/2015   Chest pain 06/22/2015   Itching 06/22/2015   Bilateral knee pain 06/22/2015   Bilateral hip pain 06/22/2015   Anemia, iron deficiency 12/22/2014   Fatigue 12/21/2014   Intertrigo 08/07/2014   Dyspareunia 07/12/2014   Menopausal syndrome (hot flushes) 07/12/2014   History of tobacco abuse 07/12/2014   Dizziness 06/22/2014   Weakness 06/22/2014   Fibromyalgia 05/14/2014   Encounter for well adult exam with abnormal findings 04/22/2014   Recurrent boils 04/22/2014   Recurrent falls 04/22/2014   Peripheral vascular disease ([place])    Depression    Anxiety    Gastroesophageal reflux disease    Prediabetes 02/23/2014   Screening mammogram for high-risk patient 02/23/2014   Back pain 07/22/2013   COPD/asthma 08/24/2009   Headache(784.0) 08/24/2009   Asthma-COPD overlap syndrome ([place]) 05/12/2009    History obtained from: chart review and patient.  [name] is a 61 y.o. female  presenting for a follow up visit.  She was last seen in February 2023 as a sick visit by Gareth Morgan, one of our nurse practitioners.  At that time, she was started on Augmentin 875 mg twice a day for 10 days.  It was also recommended that she get a chest CT.  The chest CT was completely normal.  In the interim, she was admitted 4 times in the last few months for respiratory failure and COPD exacerbations.  She was in the hospital all on February 21, May 15, June 11, and June 28.  She did see Dr. Lamonte Sakai on July 11.  She was continued on Breztri as well as Dupixent.  She was weaning her prednisone.  She was doing well on her BiPAP, which Dr. Lamonte Sakai felt would be helping with her hypoxia and hypercapnia.  For her GERD, she was continued on her PPI. She started the BiPAP two weeks ago. She is weatring it for 6 hours per night on average.   Since last visit, she has mostly done well.  Asthma/Respiratory Symptom History: She is doing Breztri two puffs twice daily. She has not been using her rescue inhaler much at all. She has not been using her albuterol much at all.  Her Dupixent is up to date. She is supposed to do it today, but it did  not come in the mail. There is a lot of pot exposure in her apartment. All of her neighbors smoke and she is constantly exposed to it. She does not smoke at all.   Allergic Rhinitis Symptom History: She remains on her allergy shots for her dust mites. She is tolerating these without a problem. She has Flonase to use as needed. She has not been on antibiotics for sinusitis or ear infections since the last visit.   Skin Symptom History: She is having a lot of itching since last Friday. She is itching over her entire body. She has been doing Benadryl all weekend. She also started drinking vinegar which seemed to help. She is doing one capful of vinegar twice daily. She is not sure whether this helped at all. She denies any bites that she can remember. She has not been outside a lot  due to the heat.  She is not eating anything out of the ordinary.   She used to have this a long time ago. She cannot remember when it was exactly. She has not eaten anything differently at all. She had some chicken wings from a [place] shop. Otherwise she has not eaten anything out of the ordinary.   She has been on famotidine and levocetirizine BID in the past in combination with doxepin. She has not been on these in a while. It is unclear why she stopped taking them. She just no longer takes it at all.   She did have a negative ANA in August 2022. This includes an anti-CCP antibody. She has not had inflammatory markers done. Alpha gal has been negative in the past, as recently as May 2021. She has never had inflammatory markers or ANA done from my end, although she had a negative ANA in 2022 at some point.   Otherwise, there have been no changes to her past medical history, surgical history, family history, or social history.    Review of Systems  Constitutional: Negative.  Negative for chills, fever, malaise/fatigue and weight loss.  HENT: Negative.  Negative for congestion, ear discharge and ear pain.   Eyes:  Negative for pain, discharge and redness.  Respiratory:  Negative for cough, sputum production, shortness of breath and wheezing.   Cardiovascular: Negative.  Negative for chest pain and palpitations.  Gastrointestinal:  Negative for abdominal pain, constipation, diarrhea, heartburn, nausea and vomiting.  Skin: Negative.  Negative for itching and rash.  Neurological:  Negative for dizziness and headaches.  Endo/Heme/Allergies:  Negative for environmental allergies. Does not bruise/bleed easily.       Objective:   Blood pressure 114/60, pulse 83, temperature 97.9 F (36.6 C), temperature source Temporal, resp. rate 16, weight 163 lb 12.8 oz (74.3 kg), SpO2 96 %. Body mass index is 29.02 kg/m.    Physical Exam Vitals reviewed.  Constitutional:      Appearance: She is  well-developed.     Comments: Very pleasant. Cooperative with the exam.   HENT:     Head: Normocephalic and atraumatic.     Right Ear: Tympanic membrane, ear canal and external ear normal.     Left Ear: Tympanic membrane, ear canal and external ear normal.     Nose: No nasal deformity, septal deviation, mucosal edema or rhinorrhea.     Right Turbinates: Enlarged, swollen and pale.     Left Turbinates: Enlarged, swollen and pale.     Right Sinus: No maxillary sinus tenderness or frontal sinus tenderness.     Left Sinus: No  maxillary sinus tenderness or frontal sinus tenderness.     Comments: No nasal polyps noted.     Mouth/Throat:     Mouth: Mucous membranes are not pale and not dry.     Pharynx: Uvula midline.  Eyes:     General: Lids are normal. Allergic shiner present.        Right eye: No discharge.        Left eye: No discharge.     Conjunctiva/sclera: Conjunctivae normal.     Right eye: Right conjunctiva is not injected. No chemosis.    Left eye: Left conjunctiva is not injected. No chemosis.    Pupils: Pupils are equal, round, and reactive to light.  Cardiovascular:     Rate and Rhythm: Normal rate and regular rhythm.     Heart sounds: Normal heart sounds.  Pulmonary:     Effort: Pulmonary effort is normal. No tachypnea, accessory muscle usage or respiratory distress.     Breath sounds: Normal breath sounds. No wheezing, rhonchi or rales.     Comments: Exquisitely tender with some guarding when I listen to her anterior right superior lung field. There is decreased air movement in that area as well.  Chest:     Chest wall: No tenderness.  Lymphadenopathy:     Cervical: No cervical adenopathy.  Skin:    General: Skin is warm.     Capillary Refill: Capillary refill takes less than 2 seconds.     Coloration: Skin is not pale.     Findings: No abrasion, erythema, petechiae or rash. Rash is not papular, urticarial or vesicular.     Comments: There are some urticarial lesions  noted on the face. Otherwise the remainder of the skin looks fairly clear.   Neurological:     Mental Status: She is alert.  Psychiatric:        Behavior: Behavior is cooperative.      Diagnostic studies: labs sent instead   She also received a dose of DepoMedrol '40mg'$  IM. She tolerated this without a problem.     Salvatore Marvel, MD  [facility]

## 2022-07-10 NOTE — Patient Instructions (Addendum)
1. Asthma-COPD overlap syndrome - Lung testing not done today (we will do that next week when you have a regular follow up visit). - I hope that the BiPAP will help decrease your admissions.  - Daily controller medication(s): Breztri 189mg two puffs twice daily with spacer + Dupixent every two weeks - Prior to physical activity: Proventil (ORANAGE INHALER) 2 puffs or Ventolin (GRAY INHALER) 2 puffs 10-15 minutes before physical activity. - Rescue medications: Proventil (ORANAGE INHALER) 2 puffs or Ventolin (GRAY INHALER) 2 puffs AND 2 puffs Atrovent (GREEN INHALER) every 4-6 hours as needed - Asthma control goals:  * Full participation in all desired activities (may need albuterol before activity) * Albuterol use two time or less a week on average (not counting use with activity) * Cough interfering with sleep two time or less a month * Oral steroids no more than once a year * No hospitalizations   2. Chronic urticaria - with current flare   - Steroid injection given today. - Restart with levocetirizine twice daily. - Restart with famotidine twice daily. - Restart with Doxepin '25mg'$  at night to help with itching.   3. 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 - Continue with allergy shots at the same schedule.   4. Follow up in one month at your regular appointment or earlier if needed.     Please inform [place] any Emergency Department visits, hospitalizations, or changes in symptoms. Call [place]efore going to the ED for breathing or allergy symptoms since we might be able to fit you in for a sick visit. Feel free to contact u[place]anytime with any questions, problems, or concerns.  It was a pleasure to see you again today!  Websites that have reliable patient information:  1. American Academy of Asthma, Allergy, and Immunology: www.aaaai.org 2. Food Allergy Research and Education (FARE): foodallergy.org 3. Mothers of  Asthmatics: http://www.asthmacommunitynetwork.org 4. American College of Allergy, Asthma, and Immunology: www.acaai.org   COVID-19 Vaccine Information can be found at: [email or web address] questions related to vaccine distribution or appointments, please email [email or web address] or call [phone]     "Like" [facility] Facebook and Instagram for our latest updates!   .  They are in [place].    Make sure you are registered to vote! If you have moved or changed any of your contact information, you will need to get this updated before voting!  In some cases, you MAY be able to register to vote online: [email or web address]

## 2022-07-13 ENCOUNTER — Ambulatory Visit (INDEPENDENT_AMBULATORY_CARE_PROVIDER_SITE_OTHER): Payer: Medicare HMO

## 2022-07-13 DIAGNOSIS — J4550 Severe persistent asthma, uncomplicated: Secondary | ICD-10-CM | POA: Diagnosis not present

## 2022-07-13 DIAGNOSIS — H25013 Cortical age-related cataract, bilateral: Secondary | ICD-10-CM | POA: Diagnosis not present

## 2022-07-13 DIAGNOSIS — E119 Type 2 diabetes mellitus without complications: Secondary | ICD-10-CM | POA: Diagnosis not present

## 2022-07-13 LAB — C-REACTIVE PROTEIN: CRP: 9 mg/L (ref 0–10)

## 2022-07-13 LAB — ANTINUCLEAR ANTIBODIES, IFA: ANA Titer 1: NEGATIVE

## 2022-07-13 LAB — TRYPTASE: Tryptase: 5.8 ug/L (ref 2.2–13.2)

## 2022-07-13 LAB — SEDIMENTATION RATE: Sed Rate: 4 mm/h (ref 0–40)

## 2022-07-16 ENCOUNTER — Ambulatory Visit
Admission: RE | Admit: 2022-07-16 | Discharge: 2022-07-16 | Disposition: A | Discharge status: Home / Self Care | Payer: Medicare HMO | Source: Ambulatory Visit | Attending: Internal Medicine | Admitting: Internal Medicine

## 2022-07-16 DIAGNOSIS — Z1231 Encounter for screening mammogram for malignant neoplasm of breast: Secondary | ICD-10-CM

## 2022-07-19 ENCOUNTER — Ambulatory Visit (INDEPENDENT_AMBULATORY_CARE_PROVIDER_SITE_OTHER): Payer: Medicare HMO

## 2022-07-19 DIAGNOSIS — J309 Allergic rhinitis, unspecified: Secondary | ICD-10-CM | POA: Diagnosis not present

## 2022-07-23 ENCOUNTER — Ambulatory Visit (INDEPENDENT_AMBULATORY_CARE_PROVIDER_SITE_OTHER): Payer: Medicare HMO

## 2022-07-23 DIAGNOSIS — J309 Allergic rhinitis, unspecified: Secondary | ICD-10-CM

## 2022-07-31 ENCOUNTER — Ambulatory Visit (INDEPENDENT_AMBULATORY_CARE_PROVIDER_SITE_OTHER): Payer: Medicare HMO

## 2022-07-31 ENCOUNTER — Ambulatory Visit: Payer: Medicare HMO | Admitting: Allergy & Immunology

## 2022-07-31 DIAGNOSIS — J4550 Severe persistent asthma, uncomplicated: Secondary | ICD-10-CM | POA: Diagnosis not present

## 2022-08-01 ENCOUNTER — Ambulatory Visit (HOSPITAL_COMMUNITY): Payer: Medicare HMO | Admitting: Psychiatry

## 2022-08-02 ENCOUNTER — Telehealth: Payer: Self-pay

## 2022-08-02 MED ORDER — TACROLIMUS 0.03 % EX OINT: TOPICAL_OINTMENT | Freq: Two times a day (BID) | CUTANEOUS | 0 refills | Status: DC

## 2022-08-02 NOTE — Addendum Note (Signed)
Addended by: Valentina Shaggy on: 08/02/2022 07:08 PM   Modules accepted: Orders

## 2022-08-02 NOTE — Telephone Encounter (Signed)
Patient called in - DOB verified - stated facial breakout and itching has gotten worse. Verified/Confirmed with patient that she has been taking Levocetirizine, Famotidine and Doxepin as prescribed/directed with no relief.  Patient was advised message would be forwarded to provider for advice.  Patient verbalized understanding, no further questions.

## 2022-08-02 NOTE — Telephone Encounter (Signed)
Called patient - DOB verified - advised of provider's notation below.   Patient verbalized understanding, no further questions.

## 2022-08-02 NOTE — Telephone Encounter (Signed)
Let's add on a topical medication to apply to see if this can help. I am sending in Protopic to use twice daily. I would like an update in one week. Please ask her to take pictures.   Salvatore Marvel, MD [facility] [place]

## 2022-08-07 ENCOUNTER — Ambulatory Visit (INDEPENDENT_AMBULATORY_CARE_PROVIDER_SITE_OTHER): Payer: Medicare HMO | Admitting: *Deleted

## 2022-08-07 DIAGNOSIS — J309 Allergic rhinitis, unspecified: Secondary | ICD-10-CM | POA: Diagnosis not present

## 2022-08-08 ENCOUNTER — Encounter: Payer: Self-pay | Admitting: Allergy & Immunology

## 2022-08-08 MED ORDER — PREDNISONE 10 MG PO TABS: ORAL_TABLET | ORAL | 0 refills | Status: DC

## 2022-08-10 ENCOUNTER — Ambulatory Visit (INDEPENDENT_AMBULATORY_CARE_PROVIDER_SITE_OTHER): Payer: Medicare HMO | Admitting: Internal Medicine

## 2022-08-10 ENCOUNTER — Encounter: Payer: Self-pay | Admitting: Internal Medicine

## 2022-08-10 VITALS — BP 128/80 | HR 72 | Temp 98.4°F | Ht 63.0 in | Wt 169.8 lb

## 2022-08-10 DIAGNOSIS — Z23 Encounter for immunization: Secondary | ICD-10-CM | POA: Diagnosis not present

## 2022-08-10 DIAGNOSIS — J329 Chronic sinusitis, unspecified: Secondary | ICD-10-CM | POA: Diagnosis not present

## 2022-08-10 DIAGNOSIS — J441 Chronic obstructive pulmonary disease with (acute) exacerbation: Secondary | ICD-10-CM

## 2022-08-10 DIAGNOSIS — R7989 Other specified abnormal findings of blood chemistry: Secondary | ICD-10-CM | POA: Diagnosis not present

## 2022-08-10 DIAGNOSIS — I10 Essential (primary) hypertension: Secondary | ICD-10-CM

## 2022-08-10 NOTE — Assessment & Plan Note (Signed)
Last vitamin D Lab Results  Component Value Date   VD25OH 23.94 (L) 05/18/2022   Low, reminded to start oral replacement

## 2022-08-10 NOTE — Patient Instructions (Addendum)
You had the flu shot today  Please have your Shingrix (shingles) shots done at your local pharmacy.  You will be contacted regarding the referral for: ENT  Please continue all other medications as before, and refills have been done if requested.  Please have the pharmacy call with any other refills you may need.  Please continue your efforts at being more active, low cholesterol diet, and weight control.  Please keep your appointments with your specialists as you may have planned  Please make an Appointment to return in 6 months, or sooner if needed

## 2022-08-10 NOTE — Progress Notes (Unsigned)
Patient ID: [name], female   DOB: [birth year], 61 y.o.   MRN: [id number]        Chief Complaint: follow up sinus symptoms       HPI:  [name] is a 61 y.o. female here overall doing well.  Pt denies chest pain, increased sob or doe, wheezing, orthopnea, PND, increased LE swelling, palpitations, dizziness or syncope.   Pt denies polydipsia, polyuria, or new focal neuro s/s.    Pt denies fever, wt loss, night sweats, loss of appetite, or other constitutional symptoms  Now on prednisone chronic fr now for persistent pulm and allergyh skin symptoms. Declines need for derm refrral.  Does have several wks ongoing nasal allergy symptoms with clearish congestion, itch and sneezing, without fever, pain, ST, cough, swelling or wheezing.   Not taking Vit D Wt Readings from Last 3 Encounters:  08/10/22 169 lb 12.8 oz (77 kg)  07/10/22 163 lb 12.8 oz (74.3 kg)  06/26/22 160 lb 3.2 oz (72.7 kg)   BP Readings from Last 3 Encounters:  08/10/22 128/80  07/10/22 114/60  06/26/22 130/74         Past Medical History:  Diagnosis Date   Anemia, iron deficiency 12/22/2014   pt. 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      Bipolar disorder ([place])    COPD (chronic obstructive pulmonary disease) ([place])    Depression    Eczema 05/18/2022   Fibromyalgia 05/14/2014   GERD (gastroesophageal reflux disease)    History of kidney stones    Hyperlipidemia 04/20/2017   HYPERTENSION 05/12/2009   Qualifier: Diagnosis of  By: Lenn Cal Deborra Medina), [name]     Kidney stones    Peripheral vascular disease ([place])    Pneumonia    Prediabetes 02/23/2014   pt. denies   Seizure ([place])    Stroke ([place]) 11/2020   Urticaria    Past Surgical History:  Procedure Laterality Date   ABDOMINAL  HYSTERECTOMY     ANTERIOR CERVICAL DECOMP/DISCECTOMY FUSION N/A 07/28/2020   Procedure: ANTERIOR CERVICAL DECOMPRESSION/DISCECTOMY FUSION. INTERBODY PROTHESIS, PLATE/SCREWS CERVICAL THREE- CERVICAL FOUR, CERVICAL FOUR- CERVICAL FIVE;  Surgeon: Newman Pies, MD;  Location: [facility];  Service: Neurosurgery;  Laterality: N/A;   BACK SURGERY     COLONOSCOPY  12/20/2011   Procedure: COLONOSCOPY;  Surgeon: Landry Dyke, MD;  Location: [facility];  Service: Endoscopy;  Laterality: N/A;   COLONOSCOPY  03/05/2012   Procedure: COLONOSCOPY;  Surgeon: Landry Dyke, MD;  Location: [facility];  Service: Endoscopy;  Laterality: N/A;   DIAGNOSTIC LAPAROSCOPY     HEMORRHOID SURGERY     INCISE AND DRAIN ABCESS     KIDNEY STONE SURGERY     NECK SURGERY     x 2 Dr Orinda Kenner   SPINE SURGERY  2019   TOE SURGERY     TUBAL LIGATION      reports that she quit smoking about 3 years ago. Her smoking use included cigarettes. She has a 9.00 pack-year smoking history. She has never used smokeless tobacco. She reports that she does not currently use alcohol. She reports current drug use. Drug: Marijuana. family history includes Alcohol abuse in her brother and father; Anemia in her daughter, sister, and sister; Anesthesia problems in her father; Asthma  in her brother, mother, and sister; Brain cancer in her sister; Breast cancer (age of onset: 69) in her sister; COPD in her mother and sister; Colon cancer (age of onset: 26) in her father; Diabetes in her brother, father, mother, sister, sister, sister, and sister; Heart disease in her brother, brother, mother, and sister; Hypertension in her brother, daughter, sister, sister, sister, sister, and sister; Kidney disease in her father; Sleep apnea in her brother. Allergies  Allergen Reactions   Belsomra [Suvorexant] Other (See Comments)    hallucinations   Sulfa Antibiotics Nausea And Vomiting   Venofer [Iron Sucrose] Hives   Current Outpatient Medications on File  Prior to Visit  Medication Sig Dispense Refill   albuterol (PROVENTIL) (2.5 MG/3ML) 0.083% nebulizer solution Take 3 mLs (2.5 mg total) by nebulization every 6 (six) hours as needed for wheezing or shortness of breath. 120 mL 0   albuterol (VENTOLIN HFA) 108 (90 Base) MCG/ACT inhaler Inhale 2 puffs into the lungs every 4 (four) hours as needed for wheezing or shortness of breath. 18 g 11   atorvastatin (LIPITOR) 40 MG tablet Take 1 tablet (40 mg total) by mouth daily. TAKE 1 TABLET(40 MG) BY MOUTH DAILY Strength: 40 mg (Patient taking differently: Take 40 mg by mouth daily.) 90 tablet 3   azelastine (OPTIVAR) 0.05 % ophthalmic solution Place 1 drop into both eyes 2 (two) times daily. 6 mL 12   BELSOMRA 15 MG TABS Take 1 tablet by mouth at bedtime as needed (sleep).     Biotin 1000 MCG tablet Take 1,000 mcg by mouth daily.     Budeson-Glycopyrrol-Formoterol (BREZTRI AEROSPHERE) 160-9-4.8 MCG/ACT AERO Inhale 2 puffs into the lungs in the morning and at bedtime. 5.9 g 6   carbamazepine (TEGRETOL) 200 MG tablet Take 2 tablets (400 mg total) by mouth 2 (two) times daily. 120 tablet 12   cetirizine (ZYRTEC) 10 MG tablet Can take one tablet by mouth once daily if needed for runny nose. (Patient taking differently: Take 10 mg by mouth daily as needed for rhinitis.) 30 tablet 5   cyclobenzaprine (FLEXERIL) 5 MG tablet TAKE 1 TABLET(5 MG) BY MOUTH THREE TIMES DAILY AS NEEDED FOR MUSCLE SPASMS (Patient taking differently: Take 5 mg by mouth 3 (three) times daily as needed for muscle spasms.) 60 tablet 5   diclofenac Sodium (VOLTAREN) 1 % GEL Apply 4 g topically 4 (four) times daily. (Patient taking differently: Apply 4 g topically 4 (four) times daily as needed (pain).) 100 g 0   DUPIXENT 300 MG/2ML prefilled syringe Inject 300 mg into the skin every 28 (twenty-eight) days.     estradiol (ESTRACE) 0.5 MG tablet Take 1 tablet (0.5 mg total) by mouth daily. 90 tablet 4   famotidine (PEPCID) 20 MG tablet Take 20 mg  by mouth daily as needed for heartburn or indigestion. OTC     fluticasone (FLONASE) 50 MCG/ACT nasal spray Place 1 spray into both nostrils 2 (two) times daily as needed for allergies or rhinitis. 16 g 5   gabapentin (NEURONTIN) 100 MG capsule Take 1 capsule (100 mg total) by mouth 3 (three) times daily. 90 capsule 3   hydrochlorothiazide (MICROZIDE) 12.5 MG capsule Take 1 capsule (12.5 mg total) by mouth daily. TAKE 1 CAPSULE(12.5 MG) BY MOUTH DAILY Strength: 12.5 mg (Patient taking differently: Take 12.5 mg by mouth daily.) 90 capsule 3   hydrOXYzine (VISTARIL) 25 MG capsule Take 1 when angry and if not calmer in 1 hour repeat (Patient taking differently: Take  25 mg by mouth every 6 (six) hours as needed for anxiety.) 45 capsule 6   ipratropium (ATROVENT HFA) 17 MCG/ACT inhaler Inhale 2 puffs into the lungs every 6 (six) hours as needed for wheezing. 1 each 12   irbesartan (AVAPRO) 75 MG tablet TAKE 1 TABLET(75 MG) BY MOUTH DAILY (Patient taking differently: Take 75 mg by mouth daily.) 90 tablet 1   levocetirizine (XYZAL) 5 MG tablet Take 1 tablet (5 mg total) by mouth every evening. 30 tablet 5   montelukast (SINGULAIR) 10 MG tablet TAKE 1 TABLET(10 MG) BY MOUTH AT BEDTIME (Patient taking differently: Take 10 mg by mouth at bedtime.) 30 tablet 0   ondansetron (ZOFRAN ODT) 4 MG disintegrating tablet Take 1 tablet (4 mg total) by mouth every 8 (eight) hours as needed for nausea or vomiting. 20 tablet 0   pantoprazole (PROTONIX) 40 MG tablet TAKE 1 TABLET BY MOUTH EVERY DAY (Patient taking differently: Take 40 mg by mouth daily.) 90 tablet 1   potassium chloride (KLOR-CON M) 10 MEQ tablet Take 1 tablet (10 mEq total) by mouth daily. 90 tablet 3   predniSONE (DELTASONE) 10 MG tablet Take two tablets ('20mg'$ ) twice daily for three days, then one tablet ('10mg'$ ) twice daily for three days, then STOP. 18 tablet 0   tacrolimus (PROTOPIC) 0.03 % ointment Apply topically 2 (two) times daily. 100 g 0   traZODone  (DESYREL) 100 MG tablet Take 1 tablet (100 mg total) by mouth at bedtime as needed for sleep. 90 tablet 1   Vitamin D, Ergocalciferol, (DRISDOL) 1.25 MG (50000 UNIT) CAPS capsule Take 1 capsule (50,000 Units total) by mouth every 7 (seven) days. 5 capsule 0   Current Facility-Administered Medications on File Prior to Visit  Medication Dose Route Frequency Provider Last Rate Last Admin   dupilumab (DUPIXENT) prefilled syringe 300 mg  300 mg Subcutaneous Q14 Days Valentina Shaggy, MD   300 mg at 08/14/22 1344        ROS:  All others reviewed and negative.  Objective        PE:  BP 128/80 (BP Location: Right Arm, Patient Position: Sitting, Cuff Size: Normal)   Pulse 72   Temp 98.4 F (36.9 C) (Oral)   Ht '5\' 3"'$  (1.6 m)   Wt 169 lb 12.8 oz (77 kg)   SpO2 97%   BMI 30.08 kg/m                 Constitutional: Pt appears in NAD               HENT: Head: NCAT.                Right Ear: External ear normal.                 Left Ear: External ear normal. Bilat tm's with mild erythema.  Max sinus areas non tender.  Pharynx with mild erythema, no exudate                Eyes: . Pupils are equal, round, and reactive to light. Conjunctivae and EOM are normal               Nose: without d/c or deformity               Neck: Neck supple. Gross normal ROM               Cardiovascular: Normal rate and regular rhythm.  Pulmonary/Chest: Effort normal and breath sounds without rales or wheezing.                Abd:  Soft, NT, ND, + BS, no organomegaly               Neurological: Pt is alert. At baseline orientation, motor grossly intact               Skin: Skin is warm. No rashes, no other new lesions, LE edema - none               Psychiatric: Pt behavior is normal without agitation   Micro: none  Cardiac tracings I have personally interpreted today:  none  Pertinent Radiological findings (summarize): none   Lab Results  Component Value Date   WBC 16.9 (H) 06/21/2022   HGB  10.1 (L) 06/21/2022   HCT 32.2 (L) 06/21/2022   PLT 472.0 (H) 06/21/2022   GLUCOSE 132 (H) 06/21/2022   CHOL 186 05/18/2022   TRIG 55.0 05/18/2022   HDL 96.00 05/18/2022   LDLCALC 79 05/18/2022   ALT 22 06/14/2022   AST 17 06/14/2022   NA 134 (L) 06/21/2022   K 4.5 06/21/2022   CL 97 06/21/2022   CREATININE 0.80 06/21/2022   BUN 31 (H) 06/21/2022   CO2 29 06/21/2022   TSH 0.20 (L) 05/18/2022   INR 0.9 02/06/2022   HGBA1C 6.3 05/18/2022   MICROALBUR 0.9 04/11/2021   Assessment/Plan:  Zury Fazzino is a 62 y.o. Black or African American [2] female with  has a past medical history of Anemia, iron deficiency (12/22/2014), Anxiety, Arthritis, ASTHMA (05/12/2009), Bipolar disorder ([place]), COPD (chronic obstructive pulmonary disease) ([place]), Depression, Eczema (05/18/2022), Fibromyalgia (05/14/2014), GERD (gastroesophageal reflux disease), History of kidney stones, Hyperlipidemia (04/20/2017), HYPERTENSION (05/12/2009), Kidney stones, Peripheral vascular disease ([place]), Pneumonia, Prediabetes (02/23/2014), Seizure ([place]), Stroke ([place]) (11/2020), and Urticaria.  Low vitamin D level Last vitamin D Lab Results  Component Value Date   VD25OH 23.94 (L) 05/18/2022   Low, reminded to start oral replacement   Chronic sinusitis Persistent, for ENT referral,  to f/u any worsening symptoms or concerns  COPD/asthma Now stable, cont current inhaler, prednisone  Essential (primary) hypertension BP Readings from Last 3 Encounters:  08/10/22 128/80  07/10/22 114/60  06/26/22 130/74   Stable, pt to continue medical treatment hct 12.5 mg qd, avapro 75 mg qd  Followup: Return in about 6 months (around 02/10/2023).  Cathlean Cower, MD 08/14/2022 8:14 PM [facility]

## 2022-08-14 ENCOUNTER — Encounter: Payer: Self-pay | Admitting: Internal Medicine

## 2022-08-14 ENCOUNTER — Ambulatory Visit (INDEPENDENT_AMBULATORY_CARE_PROVIDER_SITE_OTHER): Payer: Medicare HMO | Admitting: *Deleted

## 2022-08-14 DIAGNOSIS — J4550 Severe persistent asthma, uncomplicated: Secondary | ICD-10-CM | POA: Diagnosis not present

## 2022-08-14 DIAGNOSIS — J329 Chronic sinusitis, unspecified: Secondary | ICD-10-CM | POA: Insufficient documentation

## 2022-08-14 NOTE — Assessment & Plan Note (Signed)
Now stable, cont current inhaler, prednisone

## 2022-08-14 NOTE — Assessment & Plan Note (Signed)
BP Readings from Last 3 Encounters:  08/10/22 128/80  07/10/22 114/60  06/26/22 130/74   Stable, pt to continue medical treatment hct 12.5 mg qd, avapro 75 mg qd

## 2022-08-14 NOTE — Assessment & Plan Note (Signed)
Persistent, for ENT referral,  to f/u any worsening symptoms or concerns

## 2022-08-15 DIAGNOSIS — J3089 Other allergic rhinitis: Secondary | ICD-10-CM | POA: Diagnosis not present

## 2022-08-15 NOTE — Progress Notes (Signed)
VIAL EXP 08-16-23

## 2022-08-21 ENCOUNTER — Ambulatory Visit (INDEPENDENT_AMBULATORY_CARE_PROVIDER_SITE_OTHER): Payer: Medicare HMO | Admitting: *Deleted

## 2022-08-21 DIAGNOSIS — J309 Allergic rhinitis, unspecified: Secondary | ICD-10-CM

## 2022-08-24 ENCOUNTER — Encounter: Payer: Self-pay | Admitting: Internal Medicine

## 2022-08-29 ENCOUNTER — Ambulatory Visit (INDEPENDENT_AMBULATORY_CARE_PROVIDER_SITE_OTHER): Payer: Medicare HMO | Admitting: *Deleted

## 2022-08-29 DIAGNOSIS — J4550 Severe persistent asthma, uncomplicated: Secondary | ICD-10-CM

## 2022-08-29 MED ORDER — PREDNISONE 10 MG PO TABS
ORAL_TABLET | ORAL | 0 refills | Status: DC
Start: 2022-08-29 — End: 2022-10-08

## 2022-08-30 ENCOUNTER — Ambulatory Visit: Payer: Medicare HMO

## 2022-09-05 ENCOUNTER — Other Ambulatory Visit: Payer: Self-pay | Admitting: *Deleted

## 2022-09-05 MED ORDER — LEVOCETIRIZINE DIHYDROCHLORIDE 5 MG PO TABS: 5.0000 mg | ORAL_TABLET | Freq: Every evening | ORAL | 5 refills | Status: DC

## 2022-09-06 ENCOUNTER — Ambulatory Visit (INDEPENDENT_AMBULATORY_CARE_PROVIDER_SITE_OTHER): Payer: Medicare HMO

## 2022-09-06 DIAGNOSIS — J309 Allergic rhinitis, unspecified: Secondary | ICD-10-CM

## 2022-09-12 ENCOUNTER — Telehealth: Payer: Self-pay | Admitting: Internal Medicine

## 2022-09-12 ENCOUNTER — Encounter: Payer: Self-pay | Admitting: Family Medicine

## 2022-09-12 ENCOUNTER — Ambulatory Visit: Payer: Medicare HMO | Admitting: Internal Medicine

## 2022-09-12 ENCOUNTER — Emergency Department (HOSPITAL_COMMUNITY): Payer: Medicare HMO

## 2022-09-12 ENCOUNTER — Emergency Department (HOSPITAL_COMMUNITY)
Admission: EM | Admit: 2022-09-12 | Discharge: 2022-09-12 | Discharge status: Against Medical Advice | Payer: Medicare HMO | Attending: Student | Admitting: Student

## 2022-09-12 ENCOUNTER — Other Ambulatory Visit: Payer: Self-pay

## 2022-09-12 ENCOUNTER — Ambulatory Visit (INDEPENDENT_AMBULATORY_CARE_PROVIDER_SITE_OTHER): Payer: Medicare HMO | Admitting: Family Medicine

## 2022-09-12 VITALS — BP 130/82 | HR 82 | Temp 98.4°F | Resp 18

## 2022-09-12 DIAGNOSIS — R519 Headache, unspecified: Secondary | ICD-10-CM | POA: Diagnosis not present

## 2022-09-12 DIAGNOSIS — Z5321 Procedure and treatment not carried out due to patient leaving prior to being seen by health care provider: Secondary | ICD-10-CM | POA: Insufficient documentation

## 2022-09-12 DIAGNOSIS — R0789 Other chest pain: Secondary | ICD-10-CM | POA: Diagnosis not present

## 2022-09-12 DIAGNOSIS — J4550 Severe persistent asthma, uncomplicated: Secondary | ICD-10-CM | POA: Diagnosis not present

## 2022-09-12 DIAGNOSIS — R079 Chest pain, unspecified: Secondary | ICD-10-CM | POA: Diagnosis not present

## 2022-09-12 DIAGNOSIS — W19XXXD Unspecified fall, subsequent encounter: Secondary | ICD-10-CM

## 2022-09-12 LAB — CBC WITH DIFFERENTIAL/PLATELET
Abs Immature Granulocytes: 0.07 10*3/uL (ref 0.00–0.07)
Basophils Absolute: 0 10*3/uL (ref 0.0–0.1)
Basophils Relative: 0 %
Eosinophils Absolute: 0.2 10*3/uL (ref 0.0–0.5)
Eosinophils Relative: 2 %
HCT: 31.2 % — ABNORMAL LOW (ref 36.0–46.0)
Hemoglobin: 9 g/dL — ABNORMAL LOW (ref 12.0–15.0)
Immature Granulocytes: 1 %
Lymphocytes Relative: 21 %
Lymphs Abs: 2.4 10*3/uL (ref 0.7–4.0)
MCH: 24.9 pg — ABNORMAL LOW (ref 26.0–34.0)
MCHC: 28.8 g/dL — ABNORMAL LOW (ref 30.0–36.0)
MCV: 86.4 fL (ref 80.0–100.0)
Monocytes Absolute: 1.3 10*3/uL — ABNORMAL HIGH (ref 0.1–1.0)
Monocytes Relative: 11 %
Neutro Abs: 7.6 10*3/uL (ref 1.7–7.7)
Neutrophils Relative %: 65 %
Platelets: 371 10*3/uL (ref 150–400)
RBC: 3.61 MIL/uL — ABNORMAL LOW (ref 3.87–5.11)
RDW: 16.7 % — ABNORMAL HIGH (ref 11.5–15.5)
WBC: 11.5 10*3/uL — ABNORMAL HIGH (ref 4.0–10.5)
nRBC: 0 % (ref 0.0–0.2)

## 2022-09-12 LAB — BASIC METABOLIC PANEL
Anion gap: 7 (ref 5–15)
BUN: 15 mg/dL (ref 6–20)
CO2: 26 mmol/L (ref 22–32)
Calcium: 8.3 mg/dL — ABNORMAL LOW (ref 8.9–10.3)
Chloride: 106 mmol/L (ref 98–111)
Creatinine, Ser: 0.66 mg/dL (ref 0.44–1.00)
GFR, Estimated: >60 mL/min (ref >60)
Glucose, Bld: 88 mg/dL (ref 70–99)
Potassium: 4 mmol/L (ref 3.5–5.1)
Sodium: 139 mmol/L (ref 135–145)

## 2022-09-12 LAB — TROPONIN I (HIGH SENSITIVITY): Troponin I (High Sensitivity): 2 ng/L (ref <18)

## 2022-09-12 NOTE — Progress Notes (Signed)
[place] [address] Dept: [phone]  FOLLOW UP NOTE  Patient ID: [name], female    DOB: 1961/05/19  Age: 61 y.o. MRN: [id number] Date of Office Visit: 09/12/2022  Assessment  Chief Complaint: Shortness of Breath [place] on face sept 8th and 22nd and sob with head pain since bc powder does not help it feels drunk with ear pain)  HPI [name] is a 61 year old female who presents to the clinic for evaluation of labored breathing.  She was last seen in this clinic on 07/10/2022 by Dr. Ernst Bowler for evaluation of asthma COPD overlap syndrome, obstructive sleep apnea on BiPAP, allergic rhinitis, chronic urticaria, and recurrent infection. Jola reports that in the interim she fell in her house on the evening on August 24, 2022 and hit her face on the floor. She reports that she did not lose consciousness at that time. She does report that she began to experience a [name] sided intermittent headache occurring once every few minutes. She reports a second fall occurring on September 22 again in her house. She denies losing consciousness. She reports that about 1 week ago she began to experience intermittent chest pain with palpitations and stiffness in her fingers on her [name] hand. She reports trouble finding and forming words during that encounter. She reports that her family urged her to go to the ED for evaluation, however, she refused. At today's visit, she reports that she continues to experience headache isolated to the [name] side occurring every few minutes. She continues to take Goodies powders to alleviate this headache without much success. She reports that she is having trouble thinking and feels,"drunk all the time". She reports that she continues to experience intermittent chest pains and reports it "feels like an elephant on my chest". She reports bilateral lower extremity swelling that occurred last week and has resolved at today's  visit. She denies new medications.  She reports shortness of breath which is worse with activity occurring several times a day beginning last week. She continues Breztri 2 puffs twice a day and has been using albuterol frequently over the last week with moderate relief of symptoms. She continues Dupixent 300 mg injections once every 2 weeks with no large or local reactions. She reports a significant decrease in her asthma symptoms while continuing on Dupixent injections. She continues to wear BIPAP at night with an overall improvement in daytime somnolence. Her current medications are listed in the chart. Allergic rhinitis is reported as moderately well controlled. She continues allergen immunotherapy with no large or local reactions. Her current medications are listed in the chart.    Drug Allergies:  Allergies  Allergen Reactions   Belsomra [Suvorexant] Other (See Comments)    hallucinations   Sulfa Antibiotics Nausea And Vomiting   Venofer [Iron Sucrose] Hives    Physical Exam: BP 130/82   Pulse 82   Temp 98.4 F (36.9 C) (Temporal)   Resp 18   SpO2 97%    Physical Exam Vitals reviewed.  Constitutional:      Appearance: She is well-developed.  HENT:     Head: Normocephalic and atraumatic.     Comments: Patient grabbing the [name] side of her head in pain several times during the patient interview    [name] Ear: Tympanic membrane normal.     Left Ear: Tympanic membrane normal.     Nose:     Comments: Bilateral nares slightly erythematous with clear nasal drainage noted. Pharynx normal. Ears normal.  Eyes normal.    Mouth/Throat:     Pharynx: Oropharynx is clear.  Eyes:     Extraocular Movements: Extraocular movements intact.     Conjunctiva/sclera: Conjunctivae normal.     Pupils: Pupils are equal, round, and reactive to light.  Cardiovascular:     Rate and Rhythm: Normal rate and regular rhythm.     Heart sounds: Normal heart sounds. No murmur heard. Pulmonary:     Breath  sounds: Normal breath sounds.     Comments: Slightly tachypnic with movement. No wheezing or rhonchi Musculoskeletal:     Cervical back: Normal range of motion and neck supple.     Comments: Bilateral normal strength arms and legs.  Skin:    General: Skin is warm and dry.  Neurological:     Mental Status: She is alert and oriented to person, place, and time.  Psychiatric:        Behavior: Behavior normal.      Assessment and Plan: 1. Severe persistent asthma without complication   2. Fall, subsequent encounter   3. Intractable headache, unspecified chronicity pattern, unspecified headache type   4. Chest pain, unspecified type     No orders of the defined types were placed in this encounter.   Patient Instructions  Headache and neurological changes Go to the ED for further evaluation  Chest pain Go to the ED for further evaluation  Asthma Add Alvesco 160-2 puffs twice a day for for cough and wheeze Continue montelukast 10 mg once a day to prevent cough or wheeze Continue Breztri 2 puffs twice a day with a spacer to prevent cough or wheeze Continue DuoNeb via nebulizer once every 4-6 hours as needed for cough or wheeze Continue Dupixent injections once every 2 weeks   Allergic rhinitis Continue Xyzal 5 mg once a day as needed for runny nose Continue Flonase 2 sprays in each nostril once a day as needed for stuffy nose Consider saline nasal rinses as needed for nasal symptoms. Use this before any medicated nasal sprays for best result Continue allergen avoidance measures directed toward dust mite as listed below Continue allergen immunotherapy once a week and have access to an epinephrine auto-injector  Reflux Continue dietary and lifestyle modifications as listed below Continue famotidine 20 mg twice a day as previously prescribed  Atopic dermatitis Continue twice a day moisturizing routine  Recurrent infection Get a PPSV injection and get follow up lab work in 4  weeks Keep track of infections and antibiotic and prednisone use  Use the least amount of medications while remaining hive free Levocetirizine (Xyzal) 5 mg twice a day and famotidine (Pepcid) 20 mg twice a day. If no symptoms for 7-14 days then decrease to. Levocetirizine (Xyzal) 5 mg twice a day and famotidine (Pepcid) 20 mg once a day.  If no symptoms for 7-14 days then decrease to. Levocetirizine (Xyzal) 5 mg twice a day.  If no symptoms for 7-14 days then decrease to. Levocetirizine (Xyzal) 5 mg once a day.    Call the clinic if this treatment plan is not working well for you  Follow up in 2 weeks or sooner if needed.   Return in about 1 week (around 09/19/2022), or if symptoms worsen or fail to improve.    Thank you for the opportunity to care for this patient.  Please do not hesitate to contact me with questions.  Gareth Morgan, FNP [facility]

## 2022-09-12 NOTE — Telephone Encounter (Signed)
[facility] called to let us know that based on her visit with them today they are sending her to the ED. Her appointment for this afternoon with our office has been cancelled.

## 2022-09-12 NOTE — ED Provider Triage Note (Signed)
Emergency Medicine Provider Triage Evaluation Note  [name] , a 61 y.o. female  was evaluated in triage.  Pt complains of headaches that she describes as a sharp stabbing pain that has been occurring intermittently for the past 2 weeks.  She reports sometimes when she gets these headaches she feels like her left brain gets some chest pain and chest pressure as well.  She also reports that over the past few weeks she has had to have sudden falls but denies losing consciousness.  Patient has difficulty providing further detail and history..  Review of Systems  Positive: Headache, chest pain, falls Negative: Shortness of breath, abdominal pain, flank pain, syncope.  Physical Exam  BP (!) 140/80   Pulse 81   Temp 98 F (36.7 C)   Resp 20   SpO2 100%  Gen:   Awake, no distress   Resp:  Normal effort lungs clear to auscultation, slightly decreased air movement.  Likely chronic in the setting of COPD and asthma. MSK:   Moves extremities without difficulty  Other:  No focal neurologic deficits  Medical Decision Making  Medically screening exam initiated at 3:55 PM.  Appropriate orders placed.  Sherrell Puller Balash was informed that the remainder of the evaluation will be completed by another provider, this initial triage assessment does not replace that evaluation, and the importance of remaining in the ED until their evaluation is complete.  Labs are evaluate for chest pain, head CT ordered as well.   Jacqlyn Larsen, PA-C 09/12/22 1600

## 2022-09-12 NOTE — ED Notes (Signed)
Called patient from the waiting room with  no answer.

## 2022-09-12 NOTE — Patient Instructions (Addendum)
Headache and neurological changes Go to the ED for further evaluation  Chest pain Go to the ED for further evaluation  Asthma Add Alvesco 160-2 puffs twice a day for for cough and wheeze Continue montelukast 10 mg once a day to prevent cough or wheeze Continue Breztri 2 puffs twice a day with a spacer to prevent cough or wheeze Continue DuoNeb via nebulizer once every 4-6 hours as needed for cough or wheeze Continue Dupixent injections once every 2 weeks   Allergic rhinitis Continue Xyzal 5 mg once a day as needed for runny nose Continue Flonase 2 sprays in each nostril once a day as needed for stuffy nose Consider saline nasal rinses as needed for nasal symptoms. Use this before any medicated nasal sprays for best result Continue allergen avoidance measures directed toward dust mite as listed below Continue allergen immunotherapy once a week and have access to an epinephrine auto-injector  Reflux Continue dietary and lifestyle modifications as listed below Continue famotidine 20 mg twice a day as previously prescribed  Atopic dermatitis Continue twice a day moisturizing routine  Recurrent infection Get a PPSV injection and get follow up lab work in 4 weeks Keep track of infections and antibiotic and prednisone use  Use the least amount of medications while remaining hive free Levocetirizine (Xyzal) 5 mg twice a day and famotidine (Pepcid) 20 mg twice a day. If no symptoms for 7-14 days then decrease to. Levocetirizine (Xyzal) 5 mg twice a day and famotidine (Pepcid) 20 mg once a day.  If no symptoms for 7-14 days then decrease to. Levocetirizine (Xyzal) 5 mg twice a day.  If no symptoms for 7-14 days then decrease to. Levocetirizine (Xyzal) 5 mg once a day.    Call the clinic if this treatment plan is not working well for you  Follow up immediately after ED discharge  Control of Dust Mite Allergen Dust mites play a major role in allergic asthma and rhinitis. They occur in  environments with high humidity wherever human skin is found. Dust mites absorb humidity from the atmosphere (ie, they do not drink) and feed on organic matter (including shed human and animal skin). Dust mites are a microscopic type of insect that you cannot see with the naked eye. High levels of dust mites have been detected from mattresses, pillows, carpets, upholstered furniture, bed covers, clothes, soft toys and any woven material. The principal allergen of the dust mite is found in its feces. A gram of dust may contain 1,000 mites and 250,000 fecal particles. Mite antigen is easily measured in the air during house cleaning activities. Dust mites do not bite and do not cause harm to humans, other than by triggering allergies/asthma.  Ways to decrease your exposure to dust mites in your home:  1. Encase mattresses, box springs and pillows with a mite-impermeable barrier or cover  2. Wash sheets, blankets and drapes weekly in hot water (130 F) with detergent and dry them in a dryer on the hot setting.  3. Have the room cleaned frequently with a vacuum cleaner and a damp dust-mop. For carpeting or rugs, vacuuming with a vacuum cleaner equipped with a high-efficiency particulate air (HEPA) filter. The dust mite allergic individual should not be in a room which is being cleaned and should wait 1 hour after cleaning before going into the room.  4. Do not sleep on upholstered furniture (eg, couches).  5. If possible removing carpeting, upholstered furniture and drapery from the home is ideal. Horizontal blinds  should be eliminated in the rooms where the person spends the most time (bedroom, study, television room). Washable vinyl, roller-type shades are optimal.  6. Remove all non-washable stuffed toys from the bedroom. Wash stuffed toys weekly like sheets and blankets above.  7. Reduce indoor humidity to less than 50%. Inexpensive humidity monitors can be purchased at most hardware stores. Do not  use a humidifier as can make the problem worse and are not recommended.   Lifestyle Changes for Controlling GERD When you have GERD, stomach acid feels as if it's backing up toward your mouth. Whether or not you take medication to control your GERD, your symptoms can often be improved with lifestyle changes.   Raise Your Head Reflux is more likely to strike when you're lying down flat, because stomach fluid can flow backward more easily. Raising the head of your bed 4-6 inches can help. To do this: Slide blocks or books under the legs at the head of your bed. Or, place a wedge under the mattress. Many foam stores can make a suitable wedge for you. The wedge should run from your waist to the top of your head. Don't just prop your head on several pillows. This increases pressure on your stomach. It can make GERD worse.  Watch Your Eating Habits Certain foods may increase the acid in your stomach or relax the lower esophageal sphincter, making GERD more likely. It's best to avoid the following: Coffee, tea, and carbonated drinks (with and without caffeine) Fatty, fried, or spicy food Mint, chocolate, onions, and tomatoes Any other foods that seem to irritate your stomach or cause you pain  Relieve the Pressure Eat smaller meals, even if you have to eat more often. Don't lie down right after you eat. Wait a few hours for your stomach to empty. Avoid tight belts and tight-fitting clothes. Lose excess weight.  Tobacco and Alcohol Avoid smoking tobacco and drinking alcohol. They can make GERD symptoms worse.

## 2022-09-12 NOTE — Telephone Encounter (Signed)
FYI

## 2022-09-12 NOTE — ED Triage Notes (Signed)
Pt reports headache x2 week that feels like someone beating her in the head with fall x2 on 9/8 and 9/22. Denies LOC.

## 2022-09-13 ENCOUNTER — Ambulatory Visit (INDEPENDENT_AMBULATORY_CARE_PROVIDER_SITE_OTHER): Payer: Medicare HMO | Admitting: Internal Medicine

## 2022-09-13 ENCOUNTER — Other Ambulatory Visit: Payer: Self-pay | Admitting: Allergy & Immunology

## 2022-09-13 ENCOUNTER — Encounter: Payer: Self-pay | Admitting: Family Medicine

## 2022-09-13 ENCOUNTER — Ambulatory Visit: Payer: Medicare HMO

## 2022-09-13 VITALS — BP 128/80 | HR 81 | Temp 98.0°F | Wt 178.0 lb

## 2022-09-13 DIAGNOSIS — E559 Vitamin D deficiency, unspecified: Secondary | ICD-10-CM

## 2022-09-13 DIAGNOSIS — R7989 Other specified abnormal findings of blood chemistry: Secondary | ICD-10-CM | POA: Diagnosis not present

## 2022-09-13 DIAGNOSIS — D508 Other iron deficiency anemias: Secondary | ICD-10-CM | POA: Diagnosis not present

## 2022-09-13 DIAGNOSIS — R7303 Prediabetes: Secondary | ICD-10-CM | POA: Diagnosis not present

## 2022-09-13 LAB — T4, FREE: Free T4: 0.46 ng/dL — ABNORMAL LOW (ref 0.60–1.60)

## 2022-09-13 LAB — IBC PANEL
Iron: 25 ug/dL — ABNORMAL LOW (ref 42–145)
Saturation Ratios: 6.4 % — ABNORMAL LOW (ref 20.0–50.0)
TIBC: 393.4 ug/dL (ref 250.0–450.0)
Transferrin: 281 mg/dL (ref 212.0–360.0)

## 2022-09-13 LAB — VITAMIN D 25 HYDROXY (VIT D DEFICIENCY, FRACTURES): VITD: 29.5 ng/mL — ABNORMAL LOW (ref 30.00–100.00)

## 2022-09-13 LAB — FERRITIN: Ferritin: 10.5 ng/mL (ref 10.0–291.0)

## 2022-09-13 LAB — TSH: TSH: 1.16 u[IU]/mL (ref 0.35–5.50)

## 2022-09-13 LAB — HEMOGLOBIN A1C: Hgb A1c MFr Bld: 6.5 % (ref 4.6–6.5)

## 2022-09-13 NOTE — Progress Notes (Signed)
Patient ID: [name], female   DOB: 12/24/1960, 61 y.o.   MRN: [id number]        Chief Complaint: follow up HTN, hyperglycemia, low vit d, iron def anemia       HPI:  [name] is a 61 y.o. female here overall doing ok, no overt bleeding, due for iron level f/u.  Pt denies chest pain, increased sob or doe, wheezing, orthopnea, PND, increased LE swelling, palpitations, dizziness or syncope, though still has some sob/doe at baseline.  Denies hyper or hypo thyroid symptoms such as voice, skin or hair change.   Pt denies fever, wt loss, night sweats, loss of appetite, or other constitutional symptoms         Wt Readings from Last 3 Encounters:  09/13/22 178 lb (80.7 kg)  08/10/22 169 lb 12.8 oz (77 kg)  07/10/22 163 lb 12.8 oz (74.3 kg)   BP Readings from Last 3 Encounters:  09/13/22 128/80  09/12/22 137/77  09/12/22 130/82         Past Medical History:  Diagnosis Date   Anemia, iron deficiency 12/22/2014   pt. 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      Bipolar disorder ([place])    COPD (chronic obstructive pulmonary disease) ([place])    Depression    Eczema 05/18/2022   Fibromyalgia 05/14/2014   GERD (gastroesophageal reflux disease)    History of kidney stones    Hyperlipidemia 04/20/2017   HYPERTENSION 05/12/2009   Qualifier: Diagnosis of  By: Lenn Cal Deborra Medina), [name]     Kidney stones    Peripheral vascular disease ([place])    Pneumonia    Prediabetes 02/23/2014   pt. denies   Seizure ([place])    Stroke ([place]) 11/2020   Urticaria    Past Surgical History:  Procedure Laterality Date   ABDOMINAL HYSTERECTOMY     ANTERIOR CERVICAL DECOMP/DISCECTOMY FUSION N/A 07/28/2020   Procedure: ANTERIOR CERVICAL DECOMPRESSION/DISCECTOMY FUSION. INTERBODY  PROTHESIS, PLATE/SCREWS CERVICAL THREE- CERVICAL FOUR, CERVICAL FOUR- CERVICAL FIVE;  Surgeon: Newman Pies, MD;  Location: [facility];  Service: Neurosurgery;  Laterality: N/A;   BACK SURGERY     COLONOSCOPY  12/20/2011   Procedure: COLONOSCOPY;  Surgeon: Landry Dyke, MD;  Location: [facility];  Service: Endoscopy;  Laterality: N/A;   COLONOSCOPY  03/05/2012   Procedure: COLONOSCOPY;  Surgeon: Landry Dyke, MD;  Location: [facility];  Service: Endoscopy;  Laterality: N/A;   DIAGNOSTIC LAPAROSCOPY     HEMORRHOID SURGERY     INCISE AND DRAIN ABCESS     KIDNEY STONE SURGERY     NECK SURGERY     x 2 Dr Orinda Kenner   SPINE SURGERY  2019   TOE SURGERY     TUBAL LIGATION      reports that she quit smoking about 3 years ago. Her smoking use included cigarettes. She has a 9.00 pack-year smoking history. She has never used smokeless tobacco. She reports that she does not currently use alcohol. She reports current drug use. Drug: Marijuana. family history includes Alcohol abuse in her brother and father; Anemia in her daughter, sister, and sister; Anesthesia problems in her father; Asthma in her brother, mother, and sister; Brain cancer in her sister; Breast cancer (age of onset: 65) in her  sister; COPD in her mother and sister; Colon cancer (age of onset: 71) in her father; Diabetes in her brother, father, mother, sister, sister, sister, and sister; Heart disease in her brother, brother, mother, and sister; Hypertension in her brother, daughter, sister, sister, sister, sister, and sister; Kidney disease in her father; Sleep apnea in her brother. Allergies  Allergen Reactions   Belsomra [Suvorexant] Other (See Comments)    hallucinations   Sulfa Antibiotics Nausea And Vomiting   Venofer [Iron Sucrose] Hives   Current Outpatient Medications on File Prior to Visit  Medication Sig Dispense Refill   albuterol (PROVENTIL) (2.5 MG/3ML) 0.083% nebulizer solution Take 3 mLs (2.5 mg total) by  nebulization every 6 (six) hours as needed for wheezing or shortness of breath. 120 mL 0   albuterol (VENTOLIN HFA) 108 (90 Base) MCG/ACT inhaler Inhale 2 puffs into the lungs every 4 (four) hours as needed for wheezing or shortness of breath. 18 g 11   atorvastatin (LIPITOR) 40 MG tablet Take 1 tablet (40 mg total) by mouth daily. TAKE 1 TABLET(40 MG) BY MOUTH DAILY Strength: 40 mg (Patient taking differently: Take 40 mg by mouth daily.) 90 tablet 3   azelastine (OPTIVAR) 0.05 % ophthalmic solution Place 1 drop into both eyes 2 (two) times daily. 6 mL 12   BELSOMRA 15 MG TABS Take 1 tablet by mouth at bedtime as needed (sleep).     Biotin 1000 MCG tablet Take 1,000 mcg by mouth daily.     Budeson-Glycopyrrol-Formoterol (BREZTRI AEROSPHERE) 160-9-4.8 MCG/ACT AERO Inhale 2 puffs into the lungs in the morning and at bedtime. 5.9 g 6   carbamazepine (TEGRETOL) 200 MG tablet Take 2 tablets (400 mg total) by mouth 2 (two) times daily. 120 tablet 12   cetirizine (ZYRTEC) 10 MG tablet Can take one tablet by mouth once daily if needed for runny nose. (Patient taking differently: Take 10 mg by mouth daily as needed for rhinitis.) 30 tablet 5   cyclobenzaprine (FLEXERIL) 5 MG tablet TAKE 1 TABLET(5 MG) BY MOUTH THREE TIMES DAILY AS NEEDED FOR MUSCLE SPASMS (Patient taking differently: Take 5 mg by mouth 3 (three) times daily as needed for muscle spasms.) 60 tablet 5   diclofenac Sodium (VOLTAREN) 1 % GEL Apply 4 g topically 4 (four) times daily. (Patient taking differently: Apply 4 g topically 4 (four) times daily as needed (pain).) 100 g 0   DUPIXENT 300 MG/2ML prefilled syringe Inject 300 mg into the skin every 28 (twenty-eight) days.     estradiol (ESTRACE) 0.5 MG tablet Take 1 tablet (0.5 mg total) by mouth daily. 90 tablet 4   famotidine (PEPCID) 20 MG tablet Take 20 mg by mouth daily as needed for heartburn or indigestion. OTC     fluticasone (FLONASE) 50 MCG/ACT nasal spray Place 1 spray into both  nostrils 2 (two) times daily as needed for allergies or rhinitis. 16 g 5   gabapentin (NEURONTIN) 100 MG capsule Take 1 capsule (100 mg total) by mouth 3 (three) times daily. 90 capsule 3   hydrochlorothiazide (MICROZIDE) 12.5 MG capsule Take 1 capsule (12.5 mg total) by mouth daily. TAKE 1 CAPSULE(12.5 MG) BY MOUTH DAILY Strength: 12.5 mg (Patient taking differently: Take 12.5 mg by mouth daily.) 90 capsule 3   hydrOXYzine (VISTARIL) 25 MG capsule Take 1 when angry and if not calmer in 1 hour repeat (Patient taking differently: Take 25 mg by mouth every 6 (six) hours as needed for anxiety.) 45 capsule 6   ipratropium (ATROVENT  HFA) 17 MCG/ACT inhaler Inhale 2 puffs into the lungs every 6 (six) hours as needed for wheezing. 1 each 12   irbesartan (AVAPRO) 75 MG tablet TAKE 1 TABLET(75 MG) BY MOUTH DAILY (Patient taking differently: Take 75 mg by mouth daily.) 90 tablet 1   levocetirizine (XYZAL) 5 MG tablet Take 1 tablet (5 mg total) by mouth every evening. 30 tablet 5   montelukast (SINGULAIR) 10 MG tablet TAKE 1 TABLET(10 MG) BY MOUTH AT BEDTIME (Patient taking differently: Take 10 mg by mouth at bedtime.) 30 tablet 0   ondansetron (ZOFRAN ODT) 4 MG disintegrating tablet Take 1 tablet (4 mg total) by mouth every 8 (eight) hours as needed for nausea or vomiting. 20 tablet 0   pantoprazole (PROTONIX) 40 MG tablet TAKE 1 TABLET BY MOUTH EVERY DAY (Patient taking differently: Take 40 mg by mouth daily.) 90 tablet 1   potassium chloride (KLOR-CON M) 10 MEQ tablet Take 1 tablet (10 mEq total) by mouth daily. 90 tablet 3   predniSONE (DELTASONE) 10 MG tablet Take two tablets ('20mg'$ ) twice daily for three days, then one tablet ('10mg'$ ) twice daily for three days, then STOP. 18 tablet 0   tacrolimus (PROTOPIC) 0.03 % ointment Apply topically 2 (two) times daily. 100 g 0   traZODone (DESYREL) 100 MG tablet Take 1 tablet (100 mg total) by mouth at bedtime as needed for sleep. 90 tablet 1   Vitamin D,  Ergocalciferol, (DRISDOL) 1.25 MG (50000 UNIT) CAPS capsule Take 1 capsule (50,000 Units total) by mouth every 7 (seven) days. 5 capsule 0   Current Facility-Administered Medications on File Prior to Visit  Medication Dose Route Frequency Provider Last Rate Last Admin   dupilumab (DUPIXENT) prefilled syringe 300 mg  300 mg Subcutaneous Q14 Days Valentina Shaggy, MD   300 mg at 09/14/22 1534        ROS:  All others reviewed and negative.  Objective        PE:  BP 128/80 (BP Location: Left Arm, Patient Position: Sitting, Cuff Size: Normal)   Pulse 81   Temp 98 F (36.7 C) (Oral)   Wt 178 lb (80.7 kg)   SpO2 97%   BMI 31.53 kg/m                 Constitutional: Pt appears in NAD               HENT: Head: NCAT.                Right Ear: External ear normal.                 Left Ear: External ear normal.                Eyes: . Pupils are equal, round, and reactive to light. Conjunctivae and EOM are normal               Nose: without d/c or deformity               Neck: Neck supple. Gross normal ROM               Cardiovascular: Normal rate and regular rhythm.                 Pulmonary/Chest: Effort normal and breath sounds without rales or wheezing.                Abd:  Soft, NT, ND, + BS, no organomegaly  Neurological: Pt is alert. At baseline orientation, motor grossly intact               Skin: Skin is warm. No rashes, no other new lesions, LE edema - none               Psychiatric: Pt behavior is normal without agitation   Micro: none  Cardiac tracings I have personally interpreted today:  none  Pertinent Radiological findings (summarize): none   Lab Results  Component Value Date   WBC 11.5 (H) 09/12/2022   HGB 9.0 (L) 09/12/2022   HCT 31.2 (L) 09/12/2022   PLT 371 09/12/2022   GLUCOSE 88 09/12/2022   CHOL 186 05/18/2022   TRIG 55.0 05/18/2022   HDL 96.00 05/18/2022   LDLCALC 79 05/18/2022   ALT 22 06/14/2022   AST 17 06/14/2022   NA 139 09/12/2022    K 4.0 09/12/2022   CL 106 09/12/2022   CREATININE 0.66 09/12/2022   BUN 15 09/12/2022   CO2 26 09/12/2022   TSH 1.16 09/13/2022   INR 0.9 02/06/2022   HGBA1C 6.5 09/13/2022   MICROALBUR 0.9 04/11/2021   Assessment/Plan:  [name] is a 61 y.o. Black or African American [2] female with  has a past medical history of Anemia, iron deficiency (12/22/2014), Anxiety, Arthritis, ASTHMA (05/12/2009), Bipolar disorder ([place]), COPD (chronic obstructive pulmonary disease) ([place]), Depression, Eczema (05/18/2022), Fibromyalgia (05/14/2014), GERD (gastroesophageal reflux disease), History of kidney stones, Hyperlipidemia (04/20/2017), HYPERTENSION (05/12/2009), Kidney stones, Peripheral vascular disease ([place]), Pneumonia, Prediabetes (02/23/2014), Seizure ([place]), Stroke ([place]) (11/2020), and Urticaria.  Vitamin D deficiency Last vitamin D Lab Results  Component Value Date   VD25OH 29.50 (L) 09/13/2022   Low, to start oral replacement   Prediabetes Lab Results  Component Value Date   HGBA1C 6.5 09/13/2022   Stable, pt to continue current medical treatment  - diet, wt control, excercise   Anemia, iron deficiency No recent overt bleeding, for cbc and iron f/u labs, may need to restart oral iron  Abnormal TSH Lab Results  Component Value Date   TSH 1.16 09/13/2022   Stable, pt to continue  tx - none for now  Followup: No follow-ups on file.  Cathlean Cower, MD 09/15/2022 3:19 PM [facility]

## 2022-09-13 NOTE — Patient Instructions (Addendum)
Please continue all other medications as before, and refills have been done if requested.  Please have the pharmacy call with any other refills you may need.  Please continue your efforts at being more active, low cholesterol diet, and weight control.  Please keep your appointments with your specialists as you may have planned  Please go to the LAB at the blood drawing area for the tests to be done  You will be contacted by phone if any changes need to be made immediately.  Otherwise, you will receive a letter about your results with an explanation, but please check with MyChart first.  Please remember to sign up for MyChart if you have not done so, as this will be important to you in the future with finding out test results, communicating by private email, and scheduling acute appointments online when needed.  Please make an Appointment to return in 3 months, or sooner if needed 

## 2022-09-14 ENCOUNTER — Other Ambulatory Visit: Payer: Self-pay | Admitting: *Deleted

## 2022-09-14 ENCOUNTER — Other Ambulatory Visit: Payer: Self-pay | Admitting: Internal Medicine

## 2022-09-14 ENCOUNTER — Ambulatory Visit (INDEPENDENT_AMBULATORY_CARE_PROVIDER_SITE_OTHER): Payer: Medicare HMO

## 2022-09-14 ENCOUNTER — Encounter: Payer: Self-pay | Admitting: Internal Medicine

## 2022-09-14 DIAGNOSIS — J4550 Severe persistent asthma, uncomplicated: Secondary | ICD-10-CM | POA: Diagnosis not present

## 2022-09-14 MED ORDER — FERROUS SULFATE 325 (65 FE) MG PO TBEC: 325.0000 mg | DELAYED_RELEASE_TABLET | Freq: Every day | ORAL | 1 refills | Status: DC

## 2022-09-14 MED ORDER — PREDNISONE 10 MG PO TABS: ORAL_TABLET | ORAL | 0 refills | Status: DC

## 2022-09-14 NOTE — Telephone Encounter (Signed)
Can you please call in Begin prednisone 10 mg tablets. Take 2 tablets twice a day for 3 days, then take 2 tablets once a day for 1 day, then take 1 tablet on the 5th day, then stop

## 2022-09-15 NOTE — Assessment & Plan Note (Signed)
Lab Results  Component Value Date   TSH 1.16 09/13/2022   Stable, pt to continue  tx - none for now

## 2022-09-15 NOTE — Assessment & Plan Note (Signed)
Lab Results  Component Value Date   HGBA1C 6.5 09/13/2022   Stable, pt to continue current medical treatment - diet, wt control, excercise  

## 2022-09-15 NOTE — Assessment & Plan Note (Signed)
Last vitamin D Lab Results  Component Value Date   VD25OH 29.50 (L) 09/13/2022   Low, to start oral replacement  

## 2022-09-15 NOTE — Assessment & Plan Note (Signed)
No recent overt bleeding, for cbc and iron f/u labs, may need to restart oral iron

## 2022-09-17 MED ORDER — PANTOPRAZOLE SODIUM 40 MG PO TBEC: DELAYED_RELEASE_TABLET | ORAL | 1 refills | Status: DC

## 2022-09-17 MED ORDER — BUTALBITAL-APAP-CAFFEINE 50-325-40 MG PO TABS: ORAL_TABLET | ORAL | 0 refills | Status: DC

## 2022-09-21 ENCOUNTER — Encounter: Payer: Self-pay | Admitting: Pulmonary Disease

## 2022-09-21 ENCOUNTER — Ambulatory Visit (INDEPENDENT_AMBULATORY_CARE_PROVIDER_SITE_OTHER): Payer: Medicare HMO | Admitting: Pulmonary Disease

## 2022-09-21 VITALS — BP 120/80 | HR 76 | Temp 98.6°F | Ht 63.0 in | Wt 174.8 lb

## 2022-09-21 DIAGNOSIS — J9611 Chronic respiratory failure with hypoxia: Secondary | ICD-10-CM

## 2022-09-21 DIAGNOSIS — J4489 Other specified chronic obstructive pulmonary disease: Secondary | ICD-10-CM | POA: Diagnosis not present

## 2022-09-21 DIAGNOSIS — J9612 Chronic respiratory failure with hypercapnia: Secondary | ICD-10-CM | POA: Diagnosis not present

## 2022-09-21 DIAGNOSIS — R0602 Shortness of breath: Secondary | ICD-10-CM

## 2022-09-21 MED ORDER — PREDNISONE 10 MG PO TABS: ORAL_TABLET | ORAL | 0 refills | Status: DC

## 2022-09-21 MED ORDER — METHYLPREDNISOLONE ACETATE 80 MG/ML IJ SUSP: 80.0000 mg | Freq: Once | INTRAMUSCULAR | Status: DC

## 2022-09-21 MED ORDER — PREDNISONE 10 MG PO TABS: 10.0000 mg | ORAL_TABLET | Freq: Every day | ORAL | 0 refills | Status: DC

## 2022-09-21 NOTE — Patient Instructions (Signed)
X solumedrol 80 mg IM x 1   X Prednisone 10 mg tabs Take 4 tabs  daily with food x 4 days, then 3 tabs daily x 4 days, then 2 tabs daily x 4 days, then 1 tab daily x4 days then stop. #40  Call back if symptoms return

## 2022-09-21 NOTE — Assessment & Plan Note (Signed)
Compliant with BiPAP intestinally helped improve her daytime somnolence and fatigue. She reports falls but these do not seem to be related to hypercarbia

## 2022-09-21 NOTE — Progress Notes (Signed)
   Subjective:    Patient ID: [name], female    DOB: 1961/07/24, 61 y.o.   MRN: [id number]  HPI 61 yo female with  severe AFL, COPD/asthma , former smoker-07/2015, followed by Dr. Lamonte Sakai  She also follows with the allergy practice and is maintained on Xyzal, Singulair and Flonase nasal spray ,[place] She has been on [place] for about a year  She has been admitted twice since May, both times for acute on chronic hypercapnic respiratory failure for which she required BiPAP support.  She was able to be qualified for home BiPAP with a PCO2 57 done on 06/13/2022  Today she reports worsening dyspnea, she had an ER visit on 9/27 and saw the allergist the next day and was given prednisone 10 mg for 7 days.  She is not clear what triggered her symptoms, perhaps it could have been exposure to smoke at her apartment complex.  She is very sensitive to odors and smoke.  She states 10 mg of prednisone did not help her at all, generally needs a higher dose She is compliant with her BiPAP machine, "loves it" and uses every night, no problems with mask or pressure  PMH - GERD, seizures  I have reviewed ED visit, prior pulmonologist and allergist office visits. Chest x-ray 9/27 was reviewed which shows no cardiopulmonary disease  Significant tests/ events reviewed  Spirometry 08/2021 severe airway obstruction, ratio 45, FEV1 0.68/33%, FVC 1.51/58% PFT August 2014 with an post BD FEV1 at 46%, ratio 53, mild bronchodilator response, FVC 70%, DLCO 59%  Review of Systems neg for any significant sore throat, dysphagia, itching, sneezing, nasal congestion or excess/ purulent secretions, fever, chills, sweats, unintended wt loss, pleuritic or exertional cp, hempoptysis, orthopnea pnd or change in chronic leg swelling. Also denies presyncope, palpitations, heartburn, abdominal pain, nausea, vomiting, diarrhea or change in bowel or urinary habits, dysuria,hematuria, rash, arthralgias,  visual complaints, headache, numbness weakness or ataxia.     Objective:   Physical Exam  Gen. Pleasant, obese, in no distress ENT - no lesions, no post nasal drip Neck: No JVD, no thyromegaly, no carotid bruits Lungs: no use of accessory muscles, no dullness to percussion, bilateral scattered scattered, diffuse expiratory rhonchi Cardiovascular: Rhythm regular, heart sounds  normal, no murmurs or gallops, no peripheral edema Musculoskeletal: No deformities, no cyanosis or clubbing , no tremors       Assessment & Plan:

## 2022-09-21 NOTE — Assessment & Plan Note (Signed)
She is having an asthma exacerbation that has not responded to 10 mg of prednisone.  She has diffuse bronchospasm today, she has a high risk for hospital admission We will give her 80 mg Solu-Medrol IM and start her with a prednisone taper starting at 40 mg for 2 weeks. She will-we will call at the end of 2 weeks and may need an extended taper if symptoms persist/return.  -This does not appear to be infective, so no antibiotics required -She will continue her Breztri and use rescue albuterol either MDI or nebs. She will continue to pixantrone at milligrams every 2 weeks

## 2022-09-21 NOTE — Addendum Note (Signed)
Addended by: Alvin Critchley on: 09/21/2022 11:41 AM   Modules accepted: Orders

## 2022-09-26 ENCOUNTER — Ambulatory Visit: Payer: Medicare HMO | Admitting: Obstetrics & Gynecology

## 2022-09-26 ENCOUNTER — Ambulatory Visit (HOSPITAL_BASED_OUTPATIENT_CLINIC_OR_DEPARTMENT_OTHER): Payer: Medicare HMO | Admitting: Psychiatry

## 2022-09-26 DIAGNOSIS — F3110 Bipolar disorder, current episode manic without psychotic features, unspecified: Secondary | ICD-10-CM

## 2022-09-26 MED ORDER — CARBAMAZEPINE 200 MG PO TABS: 400.0000 mg | ORAL_TABLET | Freq: Two times a day (BID) | ORAL | 12 refills | Status: DC

## 2022-09-26 MED ORDER — GABAPENTIN 100 MG PO CAPS: 100.0000 mg | ORAL_CAPSULE | Freq: Three times a day (TID) | ORAL | 3 refills | Status: DC

## 2022-09-26 MED ORDER — HYDROXYZINE PAMOATE 25 MG PO CAPS: 25.0000 mg | ORAL_CAPSULE | Freq: Four times a day (QID) | ORAL | 4 refills | Status: DC | PRN
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
## 2022-09-28 ENCOUNTER — Ambulatory Visit (INDEPENDENT_AMBULATORY_CARE_PROVIDER_SITE_OTHER): Payer: Medicare HMO

## 2022-09-28 DIAGNOSIS — J4550 Severe persistent asthma, uncomplicated: Secondary | ICD-10-CM

## 2022-10-05 ENCOUNTER — Ambulatory Visit (INDEPENDENT_AMBULATORY_CARE_PROVIDER_SITE_OTHER): Payer: Medicare HMO

## 2022-10-05 DIAGNOSIS — J309 Allergic rhinitis, unspecified: Secondary | ICD-10-CM

## 2022-10-05 DIAGNOSIS — Z0100 Encounter for examination of eyes and vision without abnormal findings: Secondary | ICD-10-CM | POA: Diagnosis not present

## 2022-10-07 ENCOUNTER — Other Ambulatory Visit: Payer: Self-pay

## 2022-10-07 ENCOUNTER — Emergency Department (HOSPITAL_COMMUNITY): Payer: Medicare HMO

## 2022-10-07 ENCOUNTER — Inpatient Hospital Stay (HOSPITAL_COMMUNITY)
Admission: EM | Admit: 2022-10-07 | Discharge: 2022-10-08 | DRG: 190 | Disposition: A | Discharge status: Home / Self Care | Payer: Medicare HMO | Attending: Family Medicine | Admitting: Family Medicine

## 2022-10-07 DIAGNOSIS — R531 Weakness: Secondary | ICD-10-CM | POA: Diagnosis not present

## 2022-10-07 DIAGNOSIS — Z87891 Personal history of nicotine dependence: Secondary | ICD-10-CM

## 2022-10-07 DIAGNOSIS — F319 Bipolar disorder, unspecified: Secondary | ICD-10-CM | POA: Diagnosis present

## 2022-10-07 DIAGNOSIS — Z7951 Long term (current) use of inhaled steroids: Secondary | ICD-10-CM

## 2022-10-07 DIAGNOSIS — I739 Peripheral vascular disease, unspecified: Secondary | ICD-10-CM | POA: Diagnosis present

## 2022-10-07 DIAGNOSIS — Z808 Family history of malignant neoplasm of other organs or systems: Secondary | ICD-10-CM

## 2022-10-07 DIAGNOSIS — Z8249 Family history of ischemic heart disease and other diseases of the circulatory system: Secondary | ICD-10-CM

## 2022-10-07 DIAGNOSIS — F3130 Bipolar disorder, current episode depressed, mild or moderate severity, unspecified: Secondary | ICD-10-CM | POA: Diagnosis not present

## 2022-10-07 DIAGNOSIS — Z90710 Acquired absence of both cervix and uterus: Secondary | ICD-10-CM | POA: Diagnosis not present

## 2022-10-07 DIAGNOSIS — R0603 Acute respiratory distress: Principal | ICD-10-CM

## 2022-10-07 DIAGNOSIS — Z803 Family history of malignant neoplasm of breast: Secondary | ICD-10-CM

## 2022-10-07 DIAGNOSIS — Z9989 Dependence on other enabling machines and devices: Secondary | ICD-10-CM | POA: Diagnosis not present

## 2022-10-07 DIAGNOSIS — F419 Anxiety disorder, unspecified: Secondary | ICD-10-CM | POA: Diagnosis present

## 2022-10-07 DIAGNOSIS — Z79899 Other long term (current) drug therapy: Secondary | ICD-10-CM | POA: Diagnosis not present

## 2022-10-07 DIAGNOSIS — Z87442 Personal history of urinary calculi: Secondary | ICD-10-CM

## 2022-10-07 DIAGNOSIS — R069 Unspecified abnormalities of breathing: Secondary | ICD-10-CM | POA: Diagnosis not present

## 2022-10-07 DIAGNOSIS — G4733 Obstructive sleep apnea (adult) (pediatric): Secondary | ICD-10-CM | POA: Diagnosis present

## 2022-10-07 DIAGNOSIS — Z8673 Personal history of transient ischemic attack (TIA), and cerebral infarction without residual deficits: Secondary | ICD-10-CM

## 2022-10-07 DIAGNOSIS — J9621 Acute and chronic respiratory failure with hypoxia: Secondary | ICD-10-CM | POA: Diagnosis present

## 2022-10-07 DIAGNOSIS — J80 Acute respiratory distress syndrome: Secondary | ICD-10-CM | POA: Diagnosis not present

## 2022-10-07 DIAGNOSIS — R0600 Dyspnea, unspecified: Secondary | ICD-10-CM | POA: Diagnosis not present

## 2022-10-07 DIAGNOSIS — R000 Tachycardia, unspecified: Secondary | ICD-10-CM | POA: Diagnosis not present

## 2022-10-07 DIAGNOSIS — Z79890 Hormone replacement therapy (postmenopausal): Secondary | ICD-10-CM

## 2022-10-07 DIAGNOSIS — Z1152 Encounter for screening for COVID-19: Secondary | ICD-10-CM

## 2022-10-07 DIAGNOSIS — M1990 Unspecified osteoarthritis, unspecified site: Secondary | ICD-10-CM | POA: Diagnosis present

## 2022-10-07 DIAGNOSIS — R7303 Prediabetes: Secondary | ICD-10-CM | POA: Diagnosis present

## 2022-10-07 DIAGNOSIS — J4552 Severe persistent asthma with status asthmaticus: Secondary | ICD-10-CM | POA: Diagnosis not present

## 2022-10-07 DIAGNOSIS — Z882 Allergy status to sulfonamides status: Secondary | ICD-10-CM

## 2022-10-07 DIAGNOSIS — I10 Essential (primary) hypertension: Secondary | ICD-10-CM | POA: Diagnosis present

## 2022-10-07 DIAGNOSIS — J441 Chronic obstructive pulmonary disease with (acute) exacerbation: Secondary | ICD-10-CM | POA: Diagnosis not present

## 2022-10-07 DIAGNOSIS — Z888 Allergy status to other drugs, medicaments and biological substances status: Secondary | ICD-10-CM

## 2022-10-07 DIAGNOSIS — G894 Chronic pain syndrome: Secondary | ICD-10-CM | POA: Diagnosis present

## 2022-10-07 DIAGNOSIS — Z833 Family history of diabetes mellitus: Secondary | ICD-10-CM

## 2022-10-07 DIAGNOSIS — J45902 Unspecified asthma with status asthmaticus: Secondary | ICD-10-CM | POA: Diagnosis present

## 2022-10-07 DIAGNOSIS — E785 Hyperlipidemia, unspecified: Secondary | ICD-10-CM | POA: Diagnosis present

## 2022-10-07 DIAGNOSIS — Z800 Family history of malignant neoplasm of digestive organs: Secondary | ICD-10-CM

## 2022-10-07 DIAGNOSIS — Z825 Family history of asthma and other chronic lower respiratory diseases: Secondary | ICD-10-CM

## 2022-10-07 DIAGNOSIS — M797 Fibromyalgia: Secondary | ICD-10-CM | POA: Diagnosis present

## 2022-10-07 DIAGNOSIS — R0609 Other forms of dyspnea: Secondary | ICD-10-CM | POA: Diagnosis not present

## 2022-10-07 DIAGNOSIS — D509 Iron deficiency anemia, unspecified: Secondary | ICD-10-CM | POA: Diagnosis not present

## 2022-10-07 DIAGNOSIS — G40909 Epilepsy, unspecified, not intractable, without status epilepticus: Secondary | ICD-10-CM | POA: Diagnosis present

## 2022-10-07 DIAGNOSIS — Z881 Allergy status to other antibiotic agents status: Secondary | ICD-10-CM

## 2022-10-07 DIAGNOSIS — R0689 Other abnormalities of breathing: Secondary | ICD-10-CM | POA: Diagnosis not present

## 2022-10-07 DIAGNOSIS — K219 Gastro-esophageal reflux disease without esophagitis: Secondary | ICD-10-CM | POA: Diagnosis present

## 2022-10-07 DIAGNOSIS — J9811 Atelectasis: Secondary | ICD-10-CM | POA: Diagnosis not present

## 2022-10-07 DIAGNOSIS — J4489 Other specified chronic obstructive pulmonary disease: Secondary | ICD-10-CM | POA: Diagnosis present

## 2022-10-07 LAB — I-STAT VENOUS BLOOD GAS, ED
Acid-Base Excess: 7 mmol/L — ABNORMAL HIGH (ref 0.0–2.0)
Bicarbonate: 30.2 mmol/L — ABNORMAL HIGH (ref 20.0–28.0)
Calcium, Ion: 1.1 mmol/L — ABNORMAL LOW (ref 1.15–1.40)
HCT: 31 % — ABNORMAL LOW (ref 36.0–46.0)
Hemoglobin: 10.5 g/dL — ABNORMAL LOW (ref 12.0–15.0)
O2 Saturation: 100 %
Potassium: 3.9 mmol/L (ref 3.5–5.1)
Sodium: 136 mmol/L (ref 135–145)
TCO2: 31 mmol/L (ref 22–32)
pCO2, Ven: 36.7 mmHg — ABNORMAL LOW (ref 44–60)
pH, Ven: 7.523 — ABNORMAL HIGH (ref 7.25–7.43)
pO2, Ven: 200 mmHg — ABNORMAL HIGH (ref 32–45)

## 2022-10-07 LAB — BASIC METABOLIC PANEL
Anion gap: 10 (ref 5–15)
BUN: 8 mg/dL (ref 6–20)
CO2: 27 mmol/L (ref 22–32)
Calcium: 8.8 mg/dL — ABNORMAL LOW (ref 8.9–10.3)
Chloride: 101 mmol/L (ref 98–111)
Creatinine, Ser: 0.71 mg/dL (ref 0.44–1.00)
GFR, Estimated: >60 mL/min (ref >60)
Glucose, Bld: 129 mg/dL — ABNORMAL HIGH (ref 70–99)
Potassium: 3.8 mmol/L (ref 3.5–5.1)
Sodium: 138 mmol/L (ref 135–145)

## 2022-10-07 LAB — CBC WITH DIFFERENTIAL/PLATELET
Abs Immature Granulocytes: 0.11 10*3/uL — ABNORMAL HIGH (ref 0.00–0.07)
Basophils Absolute: 0.1 10*3/uL (ref 0.0–0.1)
Basophils Relative: 0 %
Eosinophils Absolute: 0.2 10*3/uL (ref 0.0–0.5)
Eosinophils Relative: 1 %
HCT: 33.4 % — ABNORMAL LOW (ref 36.0–46.0)
Hemoglobin: 10 g/dL — ABNORMAL LOW (ref 12.0–15.0)
Immature Granulocytes: 1 %
Lymphocytes Relative: 24 %
Lymphs Abs: 3.7 10*3/uL (ref 0.7–4.0)
MCH: 26 pg (ref 26.0–34.0)
MCHC: 29.9 g/dL — ABNORMAL LOW (ref 30.0–36.0)
MCV: 86.8 fL (ref 80.0–100.0)
Monocytes Absolute: 1.8 10*3/uL — ABNORMAL HIGH (ref 0.1–1.0)
Monocytes Relative: 11 %
Neutro Abs: 9.8 10*3/uL — ABNORMAL HIGH (ref 1.7–7.7)
Neutrophils Relative %: 63 %
Platelets: 413 10*3/uL — ABNORMAL HIGH (ref 150–400)
RBC: 3.85 MIL/uL — ABNORMAL LOW (ref 3.87–5.11)
RDW: 19.8 % — ABNORMAL HIGH (ref 11.5–15.5)
WBC: 15.6 10*3/uL — ABNORMAL HIGH (ref 4.0–10.5)
nRBC: 0 % (ref 0.0–0.2)

## 2022-10-07 LAB — BRAIN NATRIURETIC PEPTIDE: B Natriuretic Peptide: 24.1 pg/mL (ref 0.0–100.0)

## 2022-10-07 LAB — TROPONIN I (HIGH SENSITIVITY): Troponin I (High Sensitivity): 4 ng/L (ref <18)

## 2022-10-07 LAB — LACTIC ACID, PLASMA: Lactic Acid, Venous: 1.2 mmol/L (ref 0.5–1.9)

## 2022-10-07 MED ORDER — ALBUTEROL (5 MG/ML) CONTINUOUS INHALATION SOLN
20.0000 mg | INHALATION_SOLUTION | RESPIRATORY_TRACT | Status: DC
  Administered 2022-10-07: 20 mg via RESPIRATORY_TRACT
  Filled 2022-10-07: qty 20

## 2022-10-07 MED ORDER — ATORVASTATIN CALCIUM 40 MG PO TABS
40.0000 mg | ORAL_TABLET | Freq: Every day | ORAL | Status: DC
  Administered 2022-10-08: 40 mg via ORAL
  Filled 2022-10-07: qty 1

## 2022-10-07 MED ORDER — SODIUM CHLORIDE 0.9 % IV SOLN
1.0000 g | Freq: Once | INTRAVENOUS | Status: AC
  Administered 2022-10-07: 1 g via INTRAVENOUS
  Filled 2022-10-07: qty 10

## 2022-10-07 MED ORDER — BUDESONIDE 0.5 MG/2ML IN SUSP
0.5000 mg | Freq: Two times a day (BID) | RESPIRATORY_TRACT | Status: DC
  Filled 2022-10-07: qty 2

## 2022-10-07 MED ORDER — MAGNESIUM SULFATE 2 GM/50ML IV SOLN
2.0000 g | Freq: Once | INTRAVENOUS | Status: AC
  Administered 2022-10-07: 2 g via INTRAVENOUS
  Filled 2022-10-07: qty 50

## 2022-10-07 MED ORDER — GABAPENTIN 100 MG PO CAPS
100.0000 mg | ORAL_CAPSULE | Freq: Three times a day (TID) | ORAL | Status: DC
  Administered 2022-10-08: 100 mg via ORAL
  Filled 2022-10-07: qty 1

## 2022-10-07 MED ORDER — KETOROLAC TROMETHAMINE 15 MG/ML IJ SOLN
15.0000 mg | Freq: Once | INTRAMUSCULAR | Status: AC
  Administered 2022-10-07: 15 mg via INTRAVENOUS
  Filled 2022-10-07: qty 1

## 2022-10-07 MED ORDER — PANTOPRAZOLE SODIUM 40 MG PO TBEC
40.0000 mg | DELAYED_RELEASE_TABLET | Freq: Every day | ORAL | Status: DC
  Administered 2022-10-08: 40 mg via ORAL
  Filled 2022-10-07: qty 1

## 2022-10-07 MED ORDER — ALBUTEROL SULFATE (2.5 MG/3ML) 0.083% IN NEBU
INHALATION_SOLUTION | RESPIRATORY_TRACT | Status: AC
  Filled 2022-10-07: qty 24

## 2022-10-07 MED ORDER — FLUTICASONE PROPIONATE 50 MCG/ACT NA SUSP: 1.0000 | Freq: Two times a day (BID) | NASAL | Status: DC | PRN

## 2022-10-07 MED ORDER — REVEFENACIN 175 MCG/3ML IN SOLN
175.0000 ug | Freq: Every day | RESPIRATORY_TRACT | Status: DC
  Filled 2022-10-07 (×3): qty 3

## 2022-10-07 MED ORDER — TRAZODONE HCL 50 MG PO TABS: 100.0000 mg | ORAL_TABLET | Freq: Every evening | ORAL | Status: DC | PRN

## 2022-10-07 MED ORDER — IRBESARTAN 75 MG PO TABS
75.0000 mg | ORAL_TABLET | Freq: Every day | ORAL | Status: DC
  Administered 2022-10-08: 75 mg via ORAL
  Filled 2022-10-07: qty 1

## 2022-10-07 MED ORDER — CARBAMAZEPINE 200 MG PO TABS
400.0000 mg | ORAL_TABLET | Freq: Two times a day (BID) | ORAL | Status: DC
  Administered 2022-10-08: 400 mg via ORAL
  Filled 2022-10-07 (×3): qty 2

## 2022-10-07 MED ORDER — IPRATROPIUM-ALBUTEROL 0.5-2.5 (3) MG/3ML IN SOLN
3.0000 mL | Freq: Four times a day (QID) | RESPIRATORY_TRACT | Status: DC
  Administered 2022-10-08 (×2): 3 mL via RESPIRATORY_TRACT
  Filled 2022-10-07 (×2): qty 3

## 2022-10-07 MED ORDER — ENOXAPARIN SODIUM 40 MG/0.4ML IJ SOSY: 40.0000 mg | PREFILLED_SYRINGE | Freq: Every day | INTRAMUSCULAR | Status: DC

## 2022-10-07 MED ORDER — METHYLPREDNISOLONE SODIUM SUCC 125 MG IJ SOLR: 80.0000 mg | Freq: Every day | INTRAMUSCULAR | Status: DC

## 2022-10-07 MED ORDER — ALBUTEROL SULFATE (2.5 MG/3ML) 0.083% IN NEBU
2.5000 mg | INHALATION_SOLUTION | RESPIRATORY_TRACT | Status: DC
  Filled 2022-10-07: qty 3

## 2022-10-07 MED ORDER — ARFORMOTEROL TARTRATE 15 MCG/2ML IN NEBU
15.0000 ug | INHALATION_SOLUTION | Freq: Two times a day (BID) | RESPIRATORY_TRACT | Status: DC
  Administered 2022-10-08: 15 ug via RESPIRATORY_TRACT
  Filled 2022-10-07: qty 2

## 2022-10-07 MED ORDER — PREDNISONE 20 MG PO TABS: 40.0000 mg | ORAL_TABLET | Freq: Every day | ORAL | Status: DC

## 2022-10-07 MED ORDER — METHYLPREDNISOLONE SODIUM SUCC 40 MG IJ SOLR: 40.0000 mg | Freq: Two times a day (BID) | INTRAMUSCULAR | Status: DC

## 2022-10-07 MED ORDER — SODIUM CHLORIDE 0.9 % IV SOLN
500.0000 mg | Freq: Once | INTRAVENOUS | Status: AC
  Administered 2022-10-07: 500 mg via INTRAVENOUS
  Filled 2022-10-07: qty 5

## 2022-10-07 MED ORDER — SODIUM CHLORIDE 0.9 % IV SOLN: 1.0000 g | INTRAVENOUS | Status: DC

## 2022-10-07 MED ORDER — SODIUM CHLORIDE 0.9 % IV SOLN: 1.0000 g | Freq: Every day | INTRAVENOUS | Status: DC

## 2022-10-07 MED ORDER — METHYLPREDNISOLONE SODIUM SUCC 125 MG IJ SOLR
125.0000 mg | Freq: Two times a day (BID) | INTRAMUSCULAR | Status: AC
  Administered 2022-10-08 (×2): 125 mg via INTRAVENOUS
  Filled 2022-10-07 (×2): qty 2

## 2022-10-07 MED ORDER — SODIUM CHLORIDE 0.9 % IV SOLN: INTRAVENOUS | Status: DC

## 2022-10-07 MED ORDER — FERROUS SULFATE 325 (65 FE) MG PO TABS
325.0000 mg | ORAL_TABLET | Freq: Every day | ORAL | Status: DC
  Administered 2022-10-08: 325 mg via ORAL
  Filled 2022-10-07: qty 1

## 2022-10-07 MED ORDER — ALBUTEROL SULFATE (2.5 MG/3ML) 0.083% IN NEBU: 2.5000 mg | INHALATION_SOLUTION | RESPIRATORY_TRACT | Status: DC | PRN

## 2022-10-07 MED ORDER — HYDROXYZINE HCL 25 MG PO TABS: 25.0000 mg | ORAL_TABLET | Freq: Four times a day (QID) | ORAL | Status: DC | PRN

## 2022-10-07 NOTE — Consult Note (Signed)
NAME:  [name], MRN:  [id number], DOB:  [birth year], LOS: 0 ADMISSION DATE:  10/07/2022, CONSULTATION DATE:  10/07/22 REFERRING MD:  Dr. Reather [facility], CHIEF COMPLAINT:  shortness of breath   History of Present Illness:  61 year old woman with spirometry 2022 indicating severe fixed obstruction with FEV1 33% of predicted, severe persistent asthma on chronic IL-5 therapy presents with 4 weeks of progressive worsening of shortness of breath and dyspnea on exertion despite outpatient treatment.  Recent ED note 08/2022 reviewed.  Recent pulmonary note 09/2022 reviewed.  Most recent PCP note 08/2022 reviewed.  First noted shortness of breath about a month ago.  Saw her PCP.  Labs at that time demonstrated anemia.  Placed on iron therapy and vitamin D.  While she failed to mention it, she was also treated with prednisone 20 mg daily to taper to 10 mg.  Symptoms worsen so directed go to the ED 09/12/2022.  There, chest x-ray obtained reviewed no abnormality.  Labs confirmed anemia.  She was discharged from the ED after observation.  She then presented to her allergy doctor.  Symptoms gradually worsening with shortness of breath and dyspnea on exertion.  They advised seeing her pulmonologist.  She contacted pulmonary office and was seen 09/21/22 by Dr. Elsworth Soho for acute visit.  Notably she follows with Dr. Lamonte Sakai, primary pulmonologist.  At pulmonary visit 09/23/2022, Depo shot of Solu-Medrol as well as higher doses of prednisone were prescribed.  Despite this, she did not improve.  This prompted presentation to the ED.  She spent a lot of time during the day on her NIPPV.  She has had this for sleep apnea.  NIPPV helps.  She states her nebulizer therapy and inhaler therapy at home were not helping.  Since arrival, she is given a continuous albuterol neb.  She got Solu-Medrol in route via EMS.  She was started on community-acquired pneumonia coverage via the ED.  She reports symptomatic improvement.  She  continues on BiPAP with respiratory rate 18-20 at time of my evaluation.  Speaking in full complete sentences, multiple sentences at a time.  No respiratory distress noted.  Chest x-ray today on my review interpretation reveals clear lungs bilaterally.  Pertinent  Medical History  Asthma, COPD  Significant Hospital Events: Including procedures, antibiotic start and stop dates in addition to other pertinent events   10/07/2022 admitted to the hospital with exacerbation obstructive lung disease  Interim History / Subjective:    Objective   Blood pressure (!) 140/81, pulse (!) 124, temperature (!) 100.9 F (38.3 C), temperature source Axillary, resp. rate 19, SpO2 100 %.    FiO2 (%):  [50 %-60 %] 50 %  No intake or output data in the 24 hours ending 10/07/22 2325 There were no vitals filed for this visit.  Examination: General: Lying in stretcher, BiPAP mask on, in no acute distress Eyes: EOMI, no icterus Neck: Supple, no JVP Lungs: Fair air movement, minimal to no wheezing focally in the left upper lobe if present at all, normal work of breathing Cardiovascular: Tachycardic, warm Abdomen: Tender, bowel sounds present MSK: No synovitis, no joint effusion Neuro: Alert, oriented, moves all extremities Psych: Normal mood, full affect  Resolved Hospital Problem list     Assessment & Plan:  Subacute exacerbation of underlying obstructive lung disease: 4+ weeks.  Did not respond to prednisone therapy initially.  Curiously, has improved symptomatically with breathing treatments here in the ED and Solu-Medrol via EMS with reported lack of improvement with similar  interventions in the past. -- Solu-Medrol 40 mg IV to start tomorrow after receiving Solu-Medrol IV via EMS today -- Yupelri/budesonide/arformoterol nebulizer scheduled, as needed albuterol nebulized -- Continue BiPAP support at night given she uses this chronically for OSA, trial off BiPAP in the morning -- COVID, flu pending,  full RVP panel ordered -- Goal O2 saturation 88%  Dyspnea on exertion/shortness of breath: Prolonged and longer than would expect with simple asthma or COPD exacerbation especially after being treated in the outpatient setting. -- CTA PE protocol  Reported syncope/presyncope/falls at home: As discovered on review of systems. -- CTA PE protocol  Admit to progressive.  De-escalate oxygen support as tolerated.  Plan of care and case discussed with ED physician.  Best Practice (right click and "Reselect all SmartList Selections" daily)   Per primary  Labs   CBC: Recent Labs  Lab 10/07/22 2000 10/07/22 2010  WBC 15.6*  --   NEUTROABS 9.8*  --   HGB 10.0* 10.5*  HCT 33.4* 31.0*  MCV 86.8  --   PLT 413*  --     Basic Metabolic Panel: Recent Labs  Lab 10/07/22 2000 10/07/22 2010  NA 138 136  K 3.8 3.9  CL 101  --   CO2 27  --   GLUCOSE 129*  --   BUN 8  --   CREATININE 0.71  --   CALCIUM 8.8*  --    GFR: CrCl cannot be calculated (Unknown ideal weight.). Recent Labs  Lab 10/07/22 2000  WBC 15.6*  LATICACIDVEN 1.2    Liver Function Tests: No results for input(s): "AST", "ALT", "ALKPHOS", "BILITOT", "PROT", "ALBUMIN" in the last 168 hours. No results for input(s): "LIPASE", "AMYLASE" in the last 168 hours. No results for input(s): "AMMONIA" in the last 168 hours.  ABG    Component Value Date/Time   PHART 7.359 06/13/2022 1044   PCO2ART 57.7 (H) 06/13/2022 1044   PO2ART 253 (H) 06/13/2022 1044   HCO3 30.2 (H) 10/07/2022 2010   TCO2 31 10/07/2022 2010   O2SAT 100 10/07/2022 2010     Coagulation Profile: No results for input(s): "INR", "PROTIME" in the last 168 hours.  Cardiac Enzymes: No results for input(s): "CKTOTAL", "CKMB", "CKMBINDEX", "TROPONINI" in the last 168 hours.  HbA1C: Hgb A1c MFr Bld  Date/Time Value Ref Range Status  09/13/2022 03:29 PM 6.5 4.6 - 6.5 % Final    Comment:    Glycemic Control Guidelines for People with Diabetes:Non  Diabetic:  <6%Goal of Therapy: <7%Additional Action Suggested:  >8%   05/18/2022 04:03 PM 6.3 4.6 - 6.5 % Final    Comment:    Glycemic Control Guidelines for People with Diabetes:Non Diabetic:  <6%Goal of Therapy: <7%Additional Action Suggested:  >8%     CBG: No results for input(s): "GLUCAP" in the last 168 hours.  Review of Systems:   No chest pain with exertion.  No orthopnea or PND.  Endorses feelings of presyncope, unclear if clear syncope present over the last few weeks.  Comprehensive review of systems otherwise negative.  Past Medical History:  She,  has a past medical history of Anemia, iron deficiency (12/22/2014), Anxiety, Arthritis, ASTHMA (05/12/2009), Bipolar disorder ([place]), COPD (chronic obstructive pulmonary disease) ([place]), Depression, Eczema (05/18/2022), Fibromyalgia (05/14/2014), GERD (gastroesophageal reflux disease), History of kidney stones, Hyperlipidemia (04/20/2017), HYPERTENSION (05/12/2009), Kidney stones, Peripheral vascular disease ([place]), Pneumonia, Prediabetes (02/23/2014), Seizure ([place]), Stroke ([place]) (11/2020), and Urticaria.   Surgical History:   Past Surgical History:  Procedure Laterality Date  ABDOMINAL HYSTERECTOMY     ANTERIOR CERVICAL DECOMP/DISCECTOMY FUSION N/A 07/28/2020   Procedure: ANTERIOR CERVICAL DECOMPRESSION/DISCECTOMY FUSION. INTERBODY PROTHESIS, PLATE/SCREWS CERVICAL THREE- CERVICAL FOUR, CERVICAL FOUR- CERVICAL FIVE;  Surgeon: Newman Pies, MD;  Location: [facility];  Service: Neurosurgery;  Laterality: N/A;   BACK SURGERY     COLONOSCOPY  12/20/2011   Procedure: COLONOSCOPY;  Surgeon: Landry Dyke, MD;  Location: [facility];  Service: Endoscopy;  Laterality: N/A;   COLONOSCOPY  03/05/2012   Procedure: COLONOSCOPY;  Surgeon: Landry Dyke, MD;  Location: [facility];  Service: Endoscopy;  Laterality: N/A;   DIAGNOSTIC LAPAROSCOPY     HEMORRHOID SURGERY     INCISE AND DRAIN ABCESS     KIDNEY STONE SURGERY     NECK SURGERY     x 2  Dr Orinda Kenner   SPINE SURGERY  2019   TOE SURGERY     TUBAL LIGATION       Social History:   reports that she quit smoking about 3 years ago. Her smoking use included cigarettes. She has a 9.00 pack-year smoking history. She has never used smokeless tobacco. She reports that she does not currently use alcohol. She reports current drug use. Drug: Marijuana.   Family History:  Her family history includes Alcohol abuse in her brother and father; Anemia in her daughter, sister, and sister; Anesthesia problems in her father; Asthma in her brother, mother, and sister; Brain cancer in her sister; Breast cancer (age of onset: 51) in her sister; COPD in her mother and sister; Colon cancer (age of onset: 32) in her father; Diabetes in her brother, father, mother, sister, sister, sister, and sister; Heart disease in her brother, brother, mother, and sister; Hypertension in her brother, daughter, sister, sister, sister, sister, and sister; Kidney disease in her father; Sleep apnea in her brother. There is no history of Allergic rhinitis, Eczema, Urticaria, Esophageal cancer, Prostate cancer, or Rectal cancer.   Allergies Allergies  Allergen Reactions   Belsomra [Suvorexant] Other (See Comments)    hallucinations   Sulfa Antibiotics Nausea And Vomiting   Venofer [Iron Sucrose] Hives     Home Medications  Prior to Admission medications   Medication Sig Start Date End Date Taking? Authorizing Provider  albuterol (PROVENTIL) (2.5 MG/3ML) 0.083% nebulizer solution Take 3 mLs (2.5 mg total) by nebulization every 6 (six) hours as needed for wheezing or shortness of breath. 06/01/22   Aline August, MD  albuterol (VENTOLIN HFA) 108 (90 Base) MCG/ACT inhaler Inhale 2 puffs into the lungs every 4 (four) hours as needed for wheezing or shortness of breath. 04/11/22   Biagio Borg, MD  atorvastatin (LIPITOR) 40 MG tablet Take 1 tablet (40 mg total) by mouth daily. TAKE 1 TABLET(40 MG) BY MOUTH DAILY Strength: 40  mg Patient taking differently: Take 40 mg by mouth daily. 05/23/22   Biagio Borg, MD  azelastine (OPTIVAR) 0.05 % ophthalmic solution Place 1 drop into both eyes 2 (two) times daily. 05/18/22   Biagio Borg, MD  BELSOMRA 15 MG TABS Take 1 tablet by mouth at bedtime as needed (sleep). 05/24/22   [provider]  Biotin 1000 MCG tablet Take 1,000 mcg by mouth daily.    [provider]  Budeson-Glycopyrrol-Formoterol (BREZTRI AEROSPHERE) 160-9-4.8 MCG/ACT AERO Inhale 2 puffs into the lungs in the morning and at bedtime. 05/24/22   Collene Gobble, MD  butalbital-acetaminophen-caffeine (FIORICET) [phone] MG tablet 1 tab by mouth once daily as needed for HA, limit  14 per month 09/17/22   Biagio Borg, MD  carbamazepine (TEGRETOL) 200 MG tablet Take 2 tablets (400 mg total) by mouth 2 (two) times daily. 09/26/22   Plovsky, Berneta Sages, MD  cetirizine (ZYRTEC) 10 MG tablet Can take one tablet by mouth once daily if needed for runny nose. Patient taking differently: Take 10 mg by mouth daily as needed for rhinitis. 04/28/21   Ambs, Kathrine Cords, FNP  cyclobenzaprine (FLEXERIL) 5 MG tablet TAKE 1 TABLET(5 MG) BY MOUTH THREE TIMES DAILY AS NEEDED FOR MUSCLE SPASMS Patient taking differently: Take 5 mg by mouth 3 (three) times daily as needed for muscle spasms. 05/23/22   Biagio Borg, MD  diclofenac Sodium (VOLTAREN) 1 % GEL Apply 4 g topically 4 (four) times daily. Patient taking differently: Apply 4 g topically 4 (four) times daily as needed (pain). 06/04/20   Deno Etienne, DO  DUPIXENT 300 MG/2ML prefilled syringe Inject 300 mg into the skin every 28 (twenty-eight) days. 04/18/22   [provider]  estradiol (ESTRACE) 0.5 MG tablet Take 1 tablet (0.5 mg total) by mouth daily. 09/21/21   Princess Bruins, MD  famotidine (PEPCID) 20 MG tablet Take 20 mg by mouth daily as needed for heartburn or indigestion. OTC    [provider]  ferrous sulfate 325 (65 FE) MG EC tablet Take 1 tablet (325 mg  total) by mouth daily with breakfast. 09/14/22   Biagio Borg, MD  fluticasone [facility]) 50 MCG/ACT nasal spray Place 1 spray into both nostrils 2 (two) times daily as needed for allergies or rhinitis. 09/06/21   Valentina Shaggy, MD  gabapentin (NEURONTIN) 100 MG capsule Take 1 capsule (100 mg total) by mouth 3 (three) times daily. 09/26/22   Plovsky, Berneta Sages, MD  hydrochlorothiazide (MICROZIDE) 12.5 MG capsule Take 1 capsule (12.5 mg total) by mouth daily. TAKE 1 CAPSULE(12.5 MG) BY MOUTH DAILY Strength: 12.5 mg Patient taking differently: Take 12.5 mg by mouth daily. 05/23/22   Biagio Borg, MD  hydrOXYzine (VISTARIL) 25 MG capsule Take 1 capsule (25 mg total) by mouth every 6 (six) hours as needed for anxiety. 09/26/22   Plovsky, Berneta Sages, MD  ipratropium (ATROVENT HFA) 17 MCG/ACT inhaler Inhale 2 puffs into the lungs every 6 (six) hours as needed for wheezing. Patient not taking: Reported on 09/21/2022 01/09/22   Valentina Shaggy, MD  irbesartan (AVAPRO) 75 MG tablet TAKE 1 TABLET(75 MG) BY MOUTH DAILY Patient taking differently: Take 75 mg by mouth daily. 11/06/21   Biagio Borg, MD  levocetirizine (XYZAL) 5 MG tablet Take 1 tablet (5 mg total) by mouth every evening. Patient not taking: Reported on 09/21/2022 09/05/22   Valentina Shaggy, MD  montelukast (SINGULAIR) 10 MG tablet TAKE 1 TABLET(10 MG) BY MOUTH AT BEDTIME Patient not taking: Reported on 09/21/2022 12/06/21   Collene Gobble, MD  ondansetron (ZOFRAN ODT) 4 MG disintegrating tablet Take 1 tablet (4 mg total) by mouth every 8 (eight) hours as needed for nausea or vomiting. 05/19/21   Valentina Shaggy, MD  pantoprazole (PROTONIX) 40 MG tablet TAKE 1 TABLET BY MOUTH EVERY DAY 09/17/22   Biagio Borg, MD  potassium chloride (KLOR-CON M) 10 MEQ tablet Take 1 tablet (10 mEq total) by mouth daily. 05/23/22   Biagio Borg, MD  predniSONE (DELTASONE) 10 MG tablet Take two tablets ('20mg'$ ) twice daily for three days, then one tablet  ('10mg'$ ) twice daily for three days, then STOP. Patient not taking: Reported on 09/21/2022  08/29/22   Biagio Borg, MD  predniSONE (DELTASONE) 10 MG tablet Take 2 tablets twice a day for 3 days, then take 2 tablets once a day for 1 day, then take 1 tablet on the 5th day, then stop Patient not taking: Reported on 09/21/2022 09/14/22   Dara Hoyer, FNP  predniSONE (DELTASONE) 10 MG tablet Take 4 tablets (40 mg total) by mouth daily with breakfast for 4 days, THEN 3 tablets (30 mg total) daily with breakfast for 4 days, THEN 2 tablets (20 mg total) daily with breakfast for 4 days, THEN 1 tablet (10 mg total) daily with breakfast for 4 days. 09/21/22 10/07/22  Rigoberto Noel, MD  tacrolimus (PROTOPIC) 0.03 % ointment Apply topically 2 (two) times daily. 08/02/22   Valentina Shaggy, MD  traZODone (DESYREL) 100 MG tablet Take 1 tablet (100 mg total) by mouth at bedtime as needed for sleep. 05/18/22   Biagio Borg, MD  Vitamin D, Ergocalciferol, (DRISDOL) 1.25 MG (50000 UNIT) CAPS capsule Take 1 capsule (50,000 Units total) by mouth every 7 (seven) days. 06/05/22   Aline August, MD     Critical care time:    Lanier Clam, MD  See Shea Evans for contact info

## 2022-10-07 NOTE — H&P (Signed)
History and Physical    Patient: [name] IHK:[id number] DOB: 07/17/1961 DOA: 10/07/2022 DOS: the patient was seen and examined on 10/07/2022 PCP: Biagio Borg, MD  Patient coming from: Home  Chief Complaint:  Chief Complaint  Patient presents with   Respiratory Distress   HPI: [name] is a 61 y.o. female with medical history significant of asthma COPD overlap syndrome, fibromyalgia, seizure disorder, bipolar disorder, GERD, iron deficiency anemia, peripheral vascular disease, depression with anxiety who presents with acute respiratory distress.  Patient is on CPAP at night.  Patient has been using it and using breathing treatment at home.  Was given 10 mg of albuterol and 1 mg of Atrovent with Solu-Medrol but continues to have significant shortness of breath and wheezing.  Some cough.  Patient is not seen in the ER with initial work-up showing no pneumonia.  She is on continuous nebs at the moment.  ICU team consulted due to the severe respiratory distress.  She is on BiPAP and per ICU team she does not require ICU status but stepdown status.  Patient has slowly improved but still requiring BiPAP constantly instead of the CPAP.  Patient is therefore being admitted for further evaluation and treatment.  It appears patient has acute exacerbation of the asthma and COPD.  Review of Systems: As mentioned in the history of present illness. All other systems reviewed and are negative. Past Medical History:  Diagnosis Date   Anemia, iron deficiency 12/22/2014   pt. 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      Bipolar disorder ([place])    COPD (chronic obstructive pulmonary disease) ([place])    Depression    Eczema 05/18/2022   Fibromyalgia 05/14/2014   GERD  (gastroesophageal reflux disease)    History of kidney stones    Hyperlipidemia 04/20/2017   HYPERTENSION 05/12/2009   Qualifier: Diagnosis of  By: Lenn Cal Deborra Medina), [name]     Kidney stones    Peripheral vascular disease ([place])    Pneumonia    Prediabetes 02/23/2014   pt. denies   Seizure ([place])    Stroke ([place]) 11/2020   Urticaria    Past Surgical History:  Procedure Laterality Date   ABDOMINAL HYSTERECTOMY     ANTERIOR CERVICAL DECOMP/DISCECTOMY FUSION N/A 07/28/2020   Procedure: ANTERIOR CERVICAL DECOMPRESSION/DISCECTOMY FUSION. INTERBODY PROTHESIS, PLATE/SCREWS CERVICAL THREE- CERVICAL FOUR, CERVICAL FOUR- CERVICAL FIVE;  Surgeon: Newman Pies, MD;  Location: [facility];  Service: Neurosurgery;  Laterality: N/A;   BACK SURGERY     COLONOSCOPY  12/20/2011   Procedure: COLONOSCOPY;  Surgeon: Landry Dyke, MD;  Location: [facility];  Service: Endoscopy;  Laterality: N/A;   COLONOSCOPY  03/05/2012   Procedure: COLONOSCOPY;  Surgeon: Landry Dyke, MD;  Location: [facility];  Service: Endoscopy;  Laterality: N/A;   DIAGNOSTIC LAPAROSCOPY     HEMORRHOID SURGERY     INCISE AND DRAIN ABCESS     KIDNEY STONE SURGERY     NECK SURGERY     x 2 Dr Orinda Kenner   SPINE SURGERY  2019   TOE SURGERY     TUBAL LIGATION     Social History:  reports that she quit smoking about 3 years ago. Her smoking use included cigarettes. She has a 9.00 pack-year smoking history.  She has never used smokeless tobacco. She reports that she does not currently use alcohol. She reports current drug use. Drug: Marijuana.  Allergies  Allergen Reactions   Belsomra [Suvorexant] Other (See Comments)    hallucinations   Sulfa Antibiotics Nausea And Vomiting   Venofer [Iron Sucrose] Hives    Family History  Problem Relation Age of Onset   Diabetes Mother    COPD Mother    Heart disease Mother    Asthma Mother    Diabetes Father    Kidney disease Father    Anesthesia problems Father    Alcohol abuse  Father    Colon cancer Father 94   Diabetes Sister    Hypertension Sister    Heart disease Sister    Diabetes Sister    Brain cancer Sister    Hypertension Sister    Asthma Sister    Diabetes Sister    COPD Sister    Hypertension Sister    Breast cancer Sister 4   Anemia Sister    Hypertension Sister    Diabetes Sister    Anemia Sister    Hypertension Sister    Hypertension Daughter    Anemia Daughter    Diabetes Brother    Sleep apnea Brother    Asthma Brother    Alcohol abuse Brother    Heart disease Brother    Heart disease Brother    Hypertension Brother    Allergic rhinitis Neg Hx    Eczema Neg Hx    Urticaria Neg Hx    Esophageal cancer Neg Hx    Prostate cancer Neg Hx    Rectal cancer Neg Hx     Prior to Admission medications   Medication Sig Start Date End Date Taking? Authorizing Provider  albuterol (PROVENTIL) (2.5 MG/3ML) 0.083% nebulizer solution Take 3 mLs (2.5 mg total) by nebulization every 6 (six) hours as needed for wheezing or shortness of breath. 06/01/22   Aline August, MD  albuterol (VENTOLIN HFA) 108 (90 Base) MCG/ACT inhaler Inhale 2 puffs into the lungs every 4 (four) hours as needed for wheezing or shortness of breath. 04/11/22   Biagio Borg, MD  atorvastatin (LIPITOR) 40 MG tablet Take 1 tablet (40 mg total) by mouth daily. TAKE 1 TABLET(40 MG) BY MOUTH DAILY Strength: 40 mg Patient taking differently: Take 40 mg by mouth daily. 05/23/22   Biagio Borg, MD  azelastine (OPTIVAR) 0.05 % ophthalmic solution Place 1 drop into both eyes 2 (two) times daily. 05/18/22   Biagio Borg, MD  BELSOMRA 15 MG TABS Take 1 tablet by mouth at bedtime as needed (sleep). 05/24/22   [provider]  Biotin 1000 MCG tablet Take 1,000 mcg by mouth daily.    [provider]  Budeson-Glycopyrrol-Formoterol (BREZTRI AEROSPHERE) 160-9-4.8 MCG/ACT AERO Inhale 2 puffs into the lungs in the morning and at bedtime. 05/24/22   Collene Gobble, MD   butalbital-acetaminophen-caffeine (FIORICET) [phone] MG tablet 1 tab by mouth once daily as needed for HA, limit 14 per month 09/17/22   Biagio Borg, MD  carbamazepine (TEGRETOL) 200 MG tablet Take 2 tablets (400 mg total) by mouth 2 (two) times daily. 09/26/22   Plovsky, Berneta Sages, MD  cetirizine (ZYRTEC) 10 MG tablet Can take one tablet by mouth once daily if needed for runny nose. Patient taking differently: Take 10 mg by mouth daily as needed for rhinitis. 04/28/21   Dara Hoyer, FNP  cyclobenzaprine (FLEXERIL) 5 MG tablet TAKE 1 TABLET(5  MG) BY MOUTH THREE TIMES DAILY AS NEEDED FOR MUSCLE SPASMS Patient taking differently: Take 5 mg by mouth 3 (three) times daily as needed for muscle spasms. 05/23/22   Biagio Borg, MD  diclofenac Sodium (VOLTAREN) 1 % GEL Apply 4 g topically 4 (four) times daily. Patient taking differently: Apply 4 g topically 4 (four) times daily as needed (pain). 06/04/20   Deno Etienne, DO  DUPIXENT 300 MG/2ML prefilled syringe Inject 300 mg into the skin every 28 (twenty-eight) days. 04/18/22   [provider]  estradiol (ESTRACE) 0.5 MG tablet Take 1 tablet (0.5 mg total) by mouth daily. 09/21/21   Princess Bruins, MD  famotidine (PEPCID) 20 MG tablet Take 20 mg by mouth daily as needed for heartburn or indigestion. OTC    [provider]  ferrous sulfate 325 (65 FE) MG EC tablet Take 1 tablet (325 mg total) by mouth daily with breakfast. 09/14/22   Biagio Borg, MD  fluticasone [facility]) 50 MCG/ACT nasal spray Place 1 spray into both nostrils 2 (two) times daily as needed for allergies or rhinitis. 09/06/21   Valentina Shaggy, MD  gabapentin (NEURONTIN) 100 MG capsule Take 1 capsule (100 mg total) by mouth 3 (three) times daily. 09/26/22   Plovsky, Berneta Sages, MD  hydrochlorothiazide (MICROZIDE) 12.5 MG capsule Take 1 capsule (12.5 mg total) by mouth daily. TAKE 1 CAPSULE(12.5 MG) BY MOUTH DAILY Strength: 12.5 mg Patient taking differently: Take 12.5 mg by  mouth daily. 05/23/22   Biagio Borg, MD  hydrOXYzine (VISTARIL) 25 MG capsule Take 1 capsule (25 mg total) by mouth every 6 (six) hours as needed for anxiety. 09/26/22   Plovsky, Berneta Sages, MD  ipratropium (ATROVENT HFA) 17 MCG/ACT inhaler Inhale 2 puffs into the lungs every 6 (six) hours as needed for wheezing. Patient not taking: Reported on 09/21/2022 01/09/22   Valentina Shaggy, MD  irbesartan (AVAPRO) 75 MG tablet TAKE 1 TABLET(75 MG) BY MOUTH DAILY Patient taking differently: Take 75 mg by mouth daily. 11/06/21   Biagio Borg, MD  levocetirizine (XYZAL) 5 MG tablet Take 1 tablet (5 mg total) by mouth every evening. Patient not taking: Reported on 09/21/2022 09/05/22   Valentina Shaggy, MD  montelukast (SINGULAIR) 10 MG tablet TAKE 1 TABLET(10 MG) BY MOUTH AT BEDTIME Patient not taking: Reported on 09/21/2022 12/06/21   Collene Gobble, MD  ondansetron (ZOFRAN ODT) 4 MG disintegrating tablet Take 1 tablet (4 mg total) by mouth every 8 (eight) hours as needed for nausea or vomiting. 05/19/21   Valentina Shaggy, MD  pantoprazole (PROTONIX) 40 MG tablet TAKE 1 TABLET BY MOUTH EVERY DAY 09/17/22   Biagio Borg, MD  potassium chloride (KLOR-CON M) 10 MEQ tablet Take 1 tablet (10 mEq total) by mouth daily. 05/23/22   Biagio Borg, MD  predniSONE (DELTASONE) 10 MG tablet Take two tablets ('20mg'$ ) twice daily for three days, then one tablet ('10mg'$ ) twice daily for three days, then STOP. Patient not taking: Reported on 09/21/2022 08/29/22   Biagio Borg, MD  predniSONE (DELTASONE) 10 MG tablet Take 2 tablets twice a day for 3 days, then take 2 tablets once a day for 1 day, then take 1 tablet on the 5th day, then stop Patient not taking: Reported on 09/21/2022 09/14/22   Dara Hoyer, FNP  predniSONE (DELTASONE) 10 MG tablet Take 4 tablets (40 mg total) by mouth daily with breakfast for 4 days, THEN 3 tablets (30 mg total) daily with  breakfast for 4 days, THEN 2 tablets (20 mg total) daily with breakfast  for 4 days, THEN 1 tablet (10 mg total) daily with breakfast for 4 days. 09/21/22 10/07/22  Rigoberto Noel, MD  tacrolimus (PROTOPIC) 0.03 % ointment Apply topically 2 (two) times daily. 08/02/22   Valentina Shaggy, MD  traZODone (DESYREL) 100 MG tablet Take 1 tablet (100 mg total) by mouth at bedtime as needed for sleep. 05/18/22   Biagio Borg, MD  Vitamin D, Ergocalciferol, (DRISDOL) 1.25 MG (50000 UNIT) CAPS capsule Take 1 capsule (50,000 Units total) by mouth every 7 (seven) days. 06/05/22   Aline August, MD    Physical Exam: Vitals:   10/07/22 2045 10/07/22 2130 10/07/22 2134 10/07/22 2215  BP: (!) 135/94 120/76  (!) 140/81  Pulse: (!) 111 98  (!) 124  Resp: 20 (!) 22  19  Temp:      TempSrc:      SpO2: 100% 100% 100% 100%   Constitutional: Acutely ill looking and obvious respiratory distress, NAD, calm, comfortable Eyes: PERRL, lids and conjunctivae normal ENMT: Mucous membranes are moist. Posterior pharynx clear of any exudate or lesions.Normal dentition.  Neck: normal, supple, no masses, no thyromegaly Respiratory: Decreased air entry bilaterally, with marked expiratory wheezing, use of extra muscle respiration and currently on BiPAP. Cardiovascular: Sinus tachycardia, no murmurs / rubs / gallops. No extremity edema. 2+ pedal pulses. No carotid bruits.  Abdomen: no tenderness, no masses palpated. No hepatosplenomegaly. Bowel sounds positive.  Musculoskeletal: Good range of motion, no joint swelling or tenderness, Skin: no rashes, lesions, ulcers. No induration Neurologic: CN 2-12 grossly intact. Sensation intact, DTR normal. Strength 5/5 in all 4.  Psychiatric: Normal judgment and insight. Alert and oriented x 3. Normal mood  Data Reviewed:  Temperature 100.9, blood pressure 140/81, pulse 124, respiratory 27 and oxygen sat 100% on BiPAP.  White count is 15.6 hemoglobin 10.5 and platelet 413.  Chest x-ray showed no active disease.  CT angiogram of the chest is currently  pending.  Assessment and Plan:  #1 acute on chronic respiratory failure with hypoxia: Secondary to asthma and COPD exacerbation.  Patient currently is on BiPAP.  Initiate IV Solu-Medrol, on continuous nebulizer treatment and will be sent to stepdown unit, placed on antibiotics and titrate of oxygen.  Continue other supportive care.  #2 chronic pain syndrome: Continue chronic home regimen.  #3 essential hypertension: Continue blood pressure management.  #4 bipolar 1 disorder: Continue chronic home regimen.  #5 GERD: Continue with PPIs  #6 hyperlipidemia: Continue statin  #7 seizure disorder: Continue home regimen    Advance Care Planning:   Code Status: Prior full code  Consults: None  Family Communication: No family at bedside  Severity of Illness: The appropriate patient status for this patient is INPATIENT. Inpatient status is judged to be reasonable and necessary in order to provide the required intensity of service to ensure the patient's safety. The patient's presenting symptoms, physical exam findings, and initial radiographic and laboratory data in the context of their chronic comorbidities is felt to place them at high risk for further clinical deterioration. Furthermore, it is not anticipated that the patient will be medically stable for discharge from the hospital within 2 midnights of admission.   * I certify that at the point of admission it is my clinical judgment that the patient will require inpatient hospital care spanning beyond 2 midnights from the point of admission due to high intensity of service, high risk for further  deterioration and high frequency of surveillance required.*  AuthorBarbette Merino, MD 10/07/2022 11:12 PM  For on call review [email or web address].

## 2022-10-07 NOTE — ED Triage Notes (Addendum)
Pt BIB [facility] in resp distress, usually uses CPAP at night, has been using it all day for the past week. Diminished lung sounds, increased weakness, no appetite, malaise. Hx of COPD '10mg'$  albuterol, '1mg'$  atrovent, 125 solu-medrol, 20 L wrist.

## 2022-10-07 NOTE — ED Provider Notes (Signed)
[facility] EMERGENCY DEPARTMENT Provider Note   CSN: [id number] Arrival date & time: 10/07/22  1939     History {Add pertinent medical, surgical, social history, OB history to HPI:1} Chief Complaint  Patient presents with   Respiratory Distress    [name] is a 61 y.o. female.  HPI     Home Medications Prior to Admission medications   Medication Sig Start Date End Date Taking? Authorizing Provider  albuterol (PROVENTIL) (2.5 MG/3ML) 0.083% nebulizer solution Take 3 mLs (2.5 mg total) by nebulization every 6 (six) hours as needed for wheezing or shortness of breath. 06/01/22   Aline August, MD  albuterol (VENTOLIN HFA) 108 (90 Base) MCG/ACT inhaler Inhale 2 puffs into the lungs every 4 (four) hours as needed for wheezing or shortness of breath. 04/11/22   Biagio Borg, MD  atorvastatin (LIPITOR) 40 MG tablet Take 1 tablet (40 mg total) by mouth daily. TAKE 1 TABLET(40 MG) BY MOUTH DAILY Strength: 40 mg Patient taking differently: Take 40 mg by mouth daily. 05/23/22   Biagio Borg, MD  azelastine (OPTIVAR) 0.05 % ophthalmic solution Place 1 drop into both eyes 2 (two) times daily. 05/18/22   Biagio Borg, MD  BELSOMRA 15 MG TABS Take 1 tablet by mouth at bedtime as needed (sleep). 05/24/22   [provider]  Biotin 1000 MCG tablet Take 1,000 mcg by mouth daily.    [provider]  Budeson-Glycopyrrol-Formoterol (BREZTRI AEROSPHERE) 160-9-4.8 MCG/ACT AERO Inhale 2 puffs into the lungs in the morning and at bedtime. 05/24/22   Collene Gobble, MD  butalbital-acetaminophen-caffeine (FIORICET) [phone] MG tablet 1 tab by mouth once daily as needed for HA, limit 14 per month 09/17/22   Biagio Borg, MD  carbamazepine (TEGRETOL) 200 MG tablet Take 2 tablets (400 mg total) by mouth 2 (two) times daily. 09/26/22   Plovsky, Berneta Sages, MD  cetirizine (ZYRTEC) 10 MG tablet Can take one tablet by mouth once daily if needed for runny nose. Patient  taking differently: Take 10 mg by mouth daily as needed for rhinitis. 04/28/21   Ambs, Kathrine Cords, FNP  cyclobenzaprine (FLEXERIL) 5 MG tablet TAKE 1 TABLET(5 MG) BY MOUTH THREE TIMES DAILY AS NEEDED FOR MUSCLE SPASMS Patient taking differently: Take 5 mg by mouth 3 (three) times daily as needed for muscle spasms. 05/23/22   Biagio Borg, MD  diclofenac Sodium (VOLTAREN) 1 % GEL Apply 4 g topically 4 (four) times daily. Patient taking differently: Apply 4 g topically 4 (four) times daily as needed (pain). 06/04/20   Deno Etienne, DO  DUPIXENT 300 MG/2ML prefilled syringe Inject 300 mg into the skin every 28 (twenty-eight) days. 04/18/22   [provider]  estradiol (ESTRACE) 0.5 MG tablet Take 1 tablet (0.5 mg total) by mouth daily. 09/21/21   Princess Bruins, MD  famotidine (PEPCID) 20 MG tablet Take 20 mg by mouth daily as needed for heartburn or indigestion. OTC    [provider]  ferrous sulfate 325 (65 FE) MG EC tablet Take 1 tablet (325 mg total) by mouth daily with breakfast. 09/14/22   Biagio Borg, MD  fluticasone [facility] 50 MCG/ACT nasal spray Place 1 spray into both nostrils 2 (two) times daily as needed for allergies or rhinitis. 09/06/21   Valentina Shaggy, MD  gabapentin (NEURONTIN) 100 MG capsule Take 1 capsule (100 mg total) by mouth 3 (three) times daily. 09/26/22   Plovsky, Berneta Sages, MD  hydrochlorothiazide (MICROZIDE) 12.5 MG capsule  Take 1 capsule (12.5 mg total) by mouth daily. TAKE 1 CAPSULE(12.5 MG) BY MOUTH DAILY Strength: 12.5 mg Patient taking differently: Take 12.5 mg by mouth daily. 05/23/22   Biagio Borg, MD  hydrOXYzine (VISTARIL) 25 MG capsule Take 1 capsule (25 mg total) by mouth every 6 (six) hours as needed for anxiety. 09/26/22   Plovsky, Berneta Sages, MD  ipratropium (ATROVENT HFA) 17 MCG/ACT inhaler Inhale 2 puffs into the lungs every 6 (six) hours as needed for wheezing. Patient not taking: Reported on 09/21/2022 01/09/22   Valentina Shaggy, MD   irbesartan (AVAPRO) 75 MG tablet TAKE 1 TABLET(75 MG) BY MOUTH DAILY Patient taking differently: Take 75 mg by mouth daily. 11/06/21   Biagio Borg, MD  levocetirizine (XYZAL) 5 MG tablet Take 1 tablet (5 mg total) by mouth every evening. Patient not taking: Reported on 09/21/2022 09/05/22   Valentina Shaggy, MD  montelukast (SINGULAIR) 10 MG tablet TAKE 1 TABLET(10 MG) BY MOUTH AT BEDTIME Patient not taking: Reported on 09/21/2022 12/06/21   Collene Gobble, MD  ondansetron (ZOFRAN ODT) 4 MG disintegrating tablet Take 1 tablet (4 mg total) by mouth every 8 (eight) hours as needed for nausea or vomiting. 05/19/21   Valentina Shaggy, MD  pantoprazole (PROTONIX) 40 MG tablet TAKE 1 TABLET BY MOUTH EVERY DAY 09/17/22   Biagio Borg, MD  potassium chloride (KLOR-CON M) 10 MEQ tablet Take 1 tablet (10 mEq total) by mouth daily. 05/23/22   Biagio Borg, MD  predniSONE (DELTASONE) 10 MG tablet Take two tablets ('20mg'$ ) twice daily for three days, then one tablet ('10mg'$ ) twice daily for three days, then STOP. Patient not taking: Reported on 09/21/2022 08/29/22   Biagio Borg, MD  predniSONE (DELTASONE) 10 MG tablet Take 2 tablets twice a day for 3 days, then take 2 tablets once a day for 1 day, then take 1 tablet on the 5th day, then stop Patient not taking: Reported on 09/21/2022 09/14/22   Dara Hoyer, FNP  predniSONE (DELTASONE) 10 MG tablet Take 4 tablets (40 mg total) by mouth daily with breakfast for 4 days, THEN 3 tablets (30 mg total) daily with breakfast for 4 days, THEN 2 tablets (20 mg total) daily with breakfast for 4 days, THEN 1 tablet (10 mg total) daily with breakfast for 4 days. 09/21/22 10/07/22  Rigoberto Noel, MD  tacrolimus (PROTOPIC) 0.03 % ointment Apply topically 2 (two) times daily. 08/02/22   Valentina Shaggy, MD  traZODone (DESYREL) 100 MG tablet Take 1 tablet (100 mg total) by mouth at bedtime as needed for sleep. 05/18/22   Biagio Borg, MD  Vitamin D, Ergocalciferol,  (DRISDOL) 1.25 MG (50000 UNIT) CAPS capsule Take 1 capsule (50,000 Units total) by mouth every 7 (seven) days. 06/05/22   Aline August, MD      Allergies    Belsomra [suvorexant], Sulfa antibiotics, and Venofer [iron sucrose]    Review of Systems   Review of Systems  Physical Exam Updated Vital Signs BP (!) 148/101   Pulse (!) 117   Resp (!) 23   SpO2 100%  Physical Exam  ED Results / Procedures / Treatments   Labs (all labs ordered are listed, but only abnormal results are displayed) Labs Reviewed  RESP PANEL BY RT-PCR (FLU A&B, COVID) ARPGX2  CBC WITH DIFFERENTIAL/PLATELET  BASIC METABOLIC PANEL  BRAIN NATRIURETIC PEPTIDE    EKG None  Radiology No results found.  Procedures Procedures  {Document cardiac  monitor, telemetry assessment procedure when appropriate:1}  Medications Ordered in ED Medications - No data to display  ED Course/ Medical Decision Making/ A&P                           Medical Decision Making Amount and/or Complexity of Data Reviewed Labs: ordered. Radiology: ordered.   ***  {Document critical care time when appropriate:1} {Document review of labs and clinical decision tools ie heart score, Chads2Vasc2 etc:1}  {Document your independent review of radiology images, and any outside records:1} {Document your discussion with family members, caretakers, and with consultants:1} {Document social determinants of health affecting pt's care:1} {Document your decision making why or why not admission, treatments were needed:1} Final Clinical Impression(s) / ED Diagnoses Final diagnoses:  None    Rx / DC Orders ED Discharge Orders     None

## 2022-10-07 NOTE — ED Notes (Signed)
Brittny Pitt (Daughter) called asking for an update when there is info to give. Her number is [phone]

## 2022-10-08 ENCOUNTER — Other Ambulatory Visit (HOSPITAL_COMMUNITY): Payer: Self-pay

## 2022-10-08 ENCOUNTER — Inpatient Hospital Stay (HOSPITAL_COMMUNITY): Payer: Medicare HMO

## 2022-10-08 ENCOUNTER — Telehealth: Payer: Self-pay

## 2022-10-08 DIAGNOSIS — J9621 Acute and chronic respiratory failure with hypoxia: Secondary | ICD-10-CM | POA: Diagnosis not present

## 2022-10-08 LAB — CBC
HCT: 31.3 % — ABNORMAL LOW (ref 36.0–46.0)
Hemoglobin: 9.4 g/dL — ABNORMAL LOW (ref 12.0–15.0)
MCH: 26.1 pg (ref 26.0–34.0)
MCHC: 30 g/dL (ref 30.0–36.0)
MCV: 86.9 fL (ref 80.0–100.0)
Platelets: 412 10*3/uL — ABNORMAL HIGH (ref 150–400)
RBC: 3.6 MIL/uL — ABNORMAL LOW (ref 3.87–5.11)
RDW: 19.5 % — ABNORMAL HIGH (ref 11.5–15.5)
WBC: 14.4 10*3/uL — ABNORMAL HIGH (ref 4.0–10.5)
nRBC: 0 % (ref 0.0–0.2)

## 2022-10-08 LAB — COMPREHENSIVE METABOLIC PANEL
ALT: 21 U/L (ref 0–44)
AST: 23 U/L (ref 15–41)
Albumin: 2.9 g/dL — ABNORMAL LOW (ref 3.5–5.0)
Alkaline Phosphatase: 73 U/L (ref 38–126)
Anion gap: 14 (ref 5–15)
BUN: 11 mg/dL (ref 6–20)
CO2: 23 mmol/L (ref 22–32)
Calcium: 8.5 mg/dL — ABNORMAL LOW (ref 8.9–10.3)
Chloride: 101 mmol/L (ref 98–111)
Creatinine, Ser: 0.78 mg/dL (ref 0.44–1.00)
GFR, Estimated: >60 mL/min (ref >60)
Glucose, Bld: 233 mg/dL — ABNORMAL HIGH (ref 70–99)
Potassium: 4 mmol/L (ref 3.5–5.1)
Sodium: 138 mmol/L (ref 135–145)
Total Bilirubin: <0.1 mg/dL — ABNORMAL LOW (ref 0.3–1.2)
Total Protein: 6.1 g/dL — ABNORMAL LOW (ref 6.5–8.1)

## 2022-10-08 LAB — RESPIRATORY PANEL BY PCR

## 2022-10-08 LAB — TROPONIN I (HIGH SENSITIVITY): Troponin I (High Sensitivity): 3 ng/L (ref <18)

## 2022-10-08 LAB — CREATININE, SERUM
Creatinine, Ser: 0.81 mg/dL (ref 0.44–1.00)
GFR, Estimated: >60 mL/min (ref >60)

## 2022-10-08 LAB — RESP PANEL BY RT-PCR (FLU A&B, COVID) ARPGX2
Influenza A by PCR: NEGATIVE
Influenza B by PCR: NEGATIVE
SARS Coronavirus 2 by RT PCR: NEGATIVE

## 2022-10-08 LAB — HIV ANTIBODY (ROUTINE TESTING W REFLEX): HIV Screen 4th Generation wRfx: NONREACTIVE

## 2022-10-08 MED ORDER — DOXYCYCLINE MONOHYDRATE 100 MG PO TABS: 100.0000 mg | ORAL_TABLET | Freq: Two times a day (BID) | ORAL | 0 refills | Status: AC

## 2022-10-08 MED ORDER — PREDNISONE 50 MG PO TABS: 50.0000 mg | ORAL_TABLET | Freq: Every day | ORAL | 0 refills | Status: AC

## 2022-10-08 MED ORDER — IOHEXOL 350 MG/ML SOLN
75.0000 mL | Freq: Once | INTRAVENOUS | Status: AC | PRN
  Administered 2022-10-08: 75 mL via INTRAVENOUS

## 2022-10-08 NOTE — Telephone Encounter (Signed)
PA request received for [facility] from [facility] through [facility].  PA has been APPROVED from 10/08/2022-12/17/2023  Key: [id number]  Per test claim patient cost is $0.00

## 2022-10-08 NOTE — Discharge Summary (Signed)
Physician Discharge Summary  [name]:[id number] DOB: [birth year] DOA: 10/07/2022  PCP: Biagio Borg, MD  Admit date: 10/07/2022 Discharge date: 10/08/2022 30 Day Unplanned Readmission Risk Score    Flowsheet Row ED from 10/07/2022 in [facility]  30 Day Unplanned Readmission Risk Score (%) 26.89 Filed at 10/08/2022 0801       This score is the patient's risk of an unplanned readmission within 30 days of being discharged (0 -100%). The score is based on dignosis, age, lab data, medications, orders, and past utilization.   Low:  0-14.9   Medium: 15-21.9   High: 22-29.9   Extreme: 30 and above          Admitted From: Home Disposition: Home  Recommendations for Outpatient Follow-up:  Follow up with PCP in 1-2 weeks Please obtain BMP/CBC in one week Please follow up with your PCP on the following pending results: Unresulted Labs (From admission, onward)     Start     Ordered   10/14/22 0500  Creatinine, serum  (enoxaparin (LOVENOX)    CrCl >/= 30 ml/min)  Weekly,   R     Comments: while on enoxaparin therapy    10/07/22 2329   10/08/22 0527  CBC  (enoxaparin (LOVENOX)    CrCl >/= 30 ml/min)  Once,   R       Comments: Baseline for enoxaparin therapy IF NOT ALREADY DRAWN.  Notify MD if PLT < 100 K.    10/08/22 0526   Pending  Creatinine, serum  (enoxaparin (LOVENOX)    CrCl >/= 30 ml/min)  Add-on,   R       Comments: Baseline for enoxaparin therapy IF NOT ALREADY DRAWN.    Pending              Home Health: None Equipment/Devices: None  Discharge Condition: Stable to CODE STATUS: Full code Diet recommendation: Cardiac  Subjective: Seen and examined in the ED.  She has been off of BiPAP since early morning and she has walked in the halls without requiring any oxygen or being hypoxic.  She feels a lot better and she feels comfortable going home.  Following HPI by admitting hospitalist. HPI: [name] is a 61 y.o. female with medical history significant of asthma COPD overlap syndrome, fibromyalgia, seizure disorder, bipolar disorder, GERD, iron deficiency anemia, peripheral vascular disease, depression with anxiety who presents with acute respiratory distress.  Patient is on CPAP at night.  Patient has been using it and using breathing treatment at home.  Was given 10 mg of albuterol and 1 mg of Atrovent with Solu-Medrol but continues to have significant shortness of breath and wheezing.  Some cough.  Patient is not seen in the ER with initial work-up showing no pneumonia.  She is on continuous nebs at the moment.  ICU team consulted due to the severe respiratory distress.  She is on BiPAP and per ICU team she does not require ICU status but stepdown status.  Patient has slowly improved but still requiring BiPAP constantly instead of the CPAP.  Patient is therefore being admitted for further evaluation and treatment.  It appears patient has acute exacerbation of the asthma and COPD.  Brief/Interim Summary: Patient was basically admitted with acute hypoxic respiratory failure secondary to overlap syndrome of asthma and acute COPD exacerbation, she was started on Solu-Medrol and Rocephin.  Initially she required BiPAP and PCCM was also consulted.  They recommended admitting her to hospital  service in the stepdown unit.  However patient quickly recovered and turned around and early morning, she was weaned off of BiPAP on room air.  She walked in the halls without requiring any oxygen or becoming hypoxic.  On examination, she has very mild end expiratory wheezes but patient feels much better and comfortable going home.  That she is going to be discharged home on 5 more days of oral prednisone and doxycycline.  She will resume rest of the home medications.  Discharge plan was discussed with patient and/or family member and they verbalized understanding and agreed with it.  Discharge  Diagnoses:  Principal Problem:   Acute on chronic respiratory failure with hypoxemia (HCC) Active Problems:   Seizure disorder (HCC)   Asthma-COPD overlap syndrome   Status asthmaticus   Essential (primary) hypertension   Gastroesophageal reflux disease   Bipolar I disorder ([place])   Hyperlipidemia    Discharge Instructions   Allergies as of 10/08/2022       Reactions   Belsomra [suvorexant] Other (See Comments)   hallucinations   Sulfa Antibiotics Nausea And Vomiting   Venofer [iron Sucrose] Hives        Medication List     STOP taking these medications    cyclobenzaprine 5 MG tablet Commonly known as: FLEXERIL   traZODone 100 MG tablet Commonly known as: DESYREL       TAKE these medications    albuterol 108 (90 Base) MCG/ACT inhaler Commonly known as: VENTOLIN HFA Inhale 2 puffs into the lungs every 4 (four) hours as needed for wheezing or shortness of breath.   albuterol (2.5 MG/3ML) 0.083% nebulizer solution Commonly known as: PROVENTIL Take 3 mLs (2.5 mg total) by nebulization every 6 (six) hours as needed for wheezing or shortness of breath.   atorvastatin 40 MG tablet Commonly known as: LIPITOR Take 1 tablet (40 mg total) by mouth daily. TAKE 1 TABLET(40 MG) BY MOUTH DAILY Strength: 40 mg What changed: additional instructions   azelastine 0.05 % ophthalmic solution Commonly known as: OPTIVAR Place 1 drop into both eyes 2 (two) times daily.   Belsomra 15 MG Tabs Generic drug: Suvorexant Take 15 mg by mouth at bedtime as needed (sleep).   Breztri Aerosphere 160-9-4.8 MCG/ACT Aero Generic drug: Budeson-Glycopyrrol-Formoterol Inhale 2 puffs into the lungs in the morning and at bedtime.   butalbital-acetaminophen-caffeine 50-325-40 MG tablet Commonly known as: FIORICET 1 tab by mouth once daily as needed for HA, limit 14 per month   carbamazepine 200 MG tablet Commonly known as: TEGretol Take 2 tablets (400 mg total) by mouth 2 (two) times  daily.   cetirizine 10 MG tablet Commonly known as: ZYRTEC Can take one tablet by mouth once daily if needed for runny nose. What changed:  how much to take how to take this when to take this reasons to take this additional instructions   diclofenac Sodium 1 % Gel Commonly known as: VOLTAREN Apply 4 g topically 4 (four) times daily. What changed:  when to take this reasons to take this   doxycycline 100 MG tablet Commonly known as: ADOXA Take 1 tablet (100 mg total) by mouth 2 (two) times daily for 5 days.   Dupixent 300 MG/2ML prefilled syringe Generic drug: dupilumab Inject 300 mg into the skin every 28 (twenty-eight) days.   estradiol 0.5 MG tablet Commonly known as: ESTRACE Take 1 tablet (0.5 mg total) by mouth daily.   eucerin cream Apply 1 Application topically daily as needed for dry skin.  famotidine 20 MG tablet Commonly known as: PEPCID Take 20 mg by mouth daily as needed for heartburn or indigestion. OTC   ferrous sulfate 325 (65 FE) MG EC tablet Take 1 tablet (325 mg total) by mouth daily with breakfast.   fluticasone 50 MCG/ACT nasal spray Commonly known as: FLONASE Place 1 spray into both nostrils 2 (two) times daily as needed for allergies or rhinitis.   gabapentin 100 MG capsule Commonly known as: NEURONTIN Take 1 capsule (100 mg total) by mouth 3 (three) times daily.   hydrochlorothiazide 12.5 MG capsule Commonly known as: MICROZIDE Take 1 capsule (12.5 mg total) by mouth daily. TAKE 1 CAPSULE(12.5 MG) BY MOUTH DAILY Strength: 12.5 mg What changed: additional instructions   hydrOXYzine 25 MG capsule Commonly known as: VISTARIL Take 1 capsule (25 mg total) by mouth every 6 (six) hours as needed for anxiety.   irbesartan 75 MG tablet Commonly known as: AVAPRO TAKE 1 TABLET(75 MG) BY MOUTH DAILY   montelukast 10 MG tablet Commonly known as: SINGULAIR TAKE 1 TABLET(10 MG) BY MOUTH AT BEDTIME   ondansetron 4 MG disintegrating  tablet Commonly known as: Zofran ODT Take 1 tablet (4 mg total) by mouth every 8 (eight) hours as needed for nausea or vomiting.   pantoprazole 40 MG tablet Commonly known as: PROTONIX TAKE 1 TABLET BY MOUTH EVERY DAY What changed:  how much to take how to take this when to take this additional instructions   potassium chloride 10 MEQ tablet Commonly known as: KLOR-CON M Take 1 tablet (10 mEq total) by mouth daily.   predniSONE 50 MG tablet Commonly known as: DELTASONE Take 1 tablet (50 mg total) by mouth daily with breakfast for 4 days. What changed:  medication strength how much to take how to take this when to take this additional instructions Another medication with the same name was removed. Continue taking this medication, and follow the directions you see here.   tacrolimus 0.03 % ointment Commonly known as: Protopic Apply topically 2 (two) times daily. What changed:  how much to take when to take this reasons to take this   Vitamin D 125 MCG (5000 UT) Caps Take 5,000 Units by mouth daily.        Follow-up Information     Biagio Borg, MD Follow up in 1 week(s).   Specialties: Internal Medicine, Radiology Contact information: [place] [address] [phone]                Allergies  Allergen Reactions   Belsomra [Suvorexant] Other (See Comments)    hallucinations   Sulfa Antibiotics Nausea And Vomiting   Venofer [Iron Sucrose] Hives    Consultations: None   Procedures/Studies: CT Angio Chest Pulmonary Embolism (PE) W or WO Contrast  Result Date: 10/08/2022 CLINICAL DATA:  Dyspnea on exertion. Respiratory distress. Patient uses CPAP at night. EXAM: CT ANGIOGRAPHY CHEST WITH CONTRAST TECHNIQUE: Multidetector CT imaging of the chest was performed using the standard protocol during bolus administration of intravenous contrast. Multiplanar CT image reconstructions and MIPs were obtained to evaluate the vascular  anatomy. RADIATION DOSE REDUCTION: This exam was performed according to the departmental dose-optimization program which includes automated exposure control, adjustment of the mA and/or kV according to patient size and/or use of iterative reconstruction technique. CONTRAST:  50m OMNIPAQUE IOHEXOL 350 MG/ML SOLN COMPARISON:  03/08/2022 FINDINGS: Cardiovascular: Normal heart size. No pericardial effusions. Normal caliber thoracic aorta. No aortic dissection. Great vessel origins are patent. Central  pulmonary arteries are patent without evidence of central pulmonary embolus. Mediastinum/Nodes: No enlarged mediastinal, hilar, or axillary lymph nodes. Thyroid gland, trachea, and esophagus demonstrate no significant findings. Lungs/Pleura: Diffuse mosaic attenuation pattern to the lungs may represent emphysema, air trapping, or edema. Mild atelectasis in the lung bases. No pleural effusions. No pneumothorax. Upper Abdomen: No acute abnormality. Musculoskeletal: No chest wall abnormality. No acute or significant osseous findings. Postoperative changes in the cervical spine. Review of the MIP images confirms the above findings. IMPRESSION: 1. Mosaic attenuation pattern to the lungs may represent emphysema, air trapping, or edema. Atelectasis in the lung bases. 2. Normal appearance of the thoracic aorta. Electronically Signed   By: Lucienne Capers M.D.   On: 10/08/2022 01:40   DG Chest Portable 1 View  Result Date: 10/07/2022 CLINICAL DATA:  Dyspnea EXAM: PORTABLE CHEST 1 VIEW COMPARISON:  09/12/2022 FINDINGS: The heart size and mediastinal contours are within normal limits. Both lungs are clear. The visualized skeletal structures are unremarkable. IMPRESSION: No active disease. Electronically Signed   By: Fidela Salisbury M.D.   On: 10/07/2022 19:53   DG Chest 2 View  Result Date: 09/12/2022 CLINICAL DATA:  Chest pain EXAM: CHEST - 2 VIEW COMPARISON:  Chest x-ray dated July 11, 2022 FINDINGS: Cardiac contours are  upper limits of normal in size. Lungs are clear. No pleural effusion or pneumothorax. IMPRESSION: No active cardiopulmonary disease. Electronically Signed   By: Yetta Glassman M.D.   On: 09/12/2022 16:33   CT Head Wo Contrast  Result Date: 09/12/2022 CLINICAL DATA:  Headache, new or worsening (Age >= 50y) EXAM: CT HEAD WITHOUT CONTRAST TECHNIQUE: Contiguous axial images were obtained from the base of the skull through the vertex without intravenous contrast. RADIATION DOSE REDUCTION: This exam was performed according to the departmental dose-optimization program which includes automated exposure control, adjustment of the mA and/or kV according to patient size and/or use of iterative reconstruction technique. COMPARISON:  CT head 12/02/2020. FINDINGS: Brain: No evidence of acute infarction, hemorrhage, hydrocephalus, extra-axial collection or mass lesion/mass effect. Vascular: No hyperdense vessel identified. Skull: No acute fracture. Sinuses/Orbits: Sinuses.  No acute orbital findings. Other: No mastoid effusions. IMPRESSION: No evidence of acute intracranial abnormality. Electronically Signed   By: Margaretha Sheffield M.D.   On: 09/12/2022 16:17     Discharge Exam: Vitals:   10/08/22 1137 10/08/22 1139  BP: 122/76   Pulse:  92  Resp:    Temp:    SpO2:  91%   Vitals:   10/08/22 0800 10/08/22 0815 10/08/22 1137 10/08/22 1139  BP:  133/80 122/76   Pulse:  91  92  Resp: 16     Temp:      TempSrc:      SpO2:  100%  91%    General: Pt is alert, awake, not in acute distress, Cardiovascular: RRR, S1/S2 +, no rubs, no gallops Respiratory: Mild end expiratory wheezes bilaterally. Abdominal: Soft, NT, ND, bowel sounds + Extremities: no edema, no cyanosis    The results of significant diagnostics from this hospitalization (including imaging, microbiology, ancillary and laboratory) are listed below for reference.     Microbiology: Recent Results (from the past 240 hour(s))  Resp Panel by  RT-PCR (Flu A&B, Covid) Nasopharyngeal Swab     Status: None   Collection Time: 10/07/22 11:45 PM   Specimen: Nasopharyngeal Swab; Nasal Swab  Result Value Ref Range Status   SARS Coronavirus 2 by RT PCR NEGATIVE NEGATIVE Final    Comment: (NOTE) SARS-CoV-2  target nucleic acids are NOT DETECTED.  The SARS-CoV-2 RNA is generally detectable in upper respiratory specimens during the acute phase of infection. The lowest concentration of SARS-CoV-2 viral copies this assay can detect is 138 copies/mL. A negative result does not preclude SARS-Cov-2 infection and should not be used as the sole basis for treatment or other patient management decisions. A negative result may occur with  improper specimen collection/handling, submission of specimen other than nasopharyngeal swab, presence of viral mutation(s) within the areas targeted by this assay, and inadequate number of viral copies(<138 copies/mL). A negative result must be combined with clinical observations, patient history, and epidemiological information. The expected result is Negative.  Fact Sheet for Patients:  EntrepreneurPulse.com.au  Fact Sheet for Healthcare Providers:  IncredibleEmployment.be  This test is no t yet approved or cleared by the [place] FDA and  has been authorized for detection and/or diagnosis of SARS-CoV-2 by FDA under an Emergency Use Authorization (EUA). This EUA will remain  in effect (meaning this test can be used) for the duration of the COVID-19 declaration under Section 564(b)(1) of the Act, 21 U.S.C.section 360bbb-3(b)(1), unless the authorization is terminated  or revoked sooner.       Influenza A by PCR NEGATIVE NEGATIVE Final   Influenza B by PCR NEGATIVE NEGATIVE Final    Comment: (NOTE) The Xpert Xpress SARS-CoV-2/FLU/RSV plus assay is intended as an aid in the diagnosis of influenza from Nasopharyngeal swab specimens and should not be used as a sole  basis for treatment. Nasal washings and aspirates are unacceptable for Xpert Xpress SARS-CoV-2/FLU/RSV testing.  Fact Sheet for Patients: EntrepreneurPulse.com.au  Fact Sheet for Healthcare Providers: IncredibleEmployment.be  This test is not yet approved or cleared by the [place] FDA and has been authorized for detection and/or diagnosis of SARS-CoV-2 by FDA under an Emergency Use Authorization (EUA). This EUA will remain in effect (meaning this test can be used) for the duration of the COVID-19 declaration under Section 564(b)(1) of the Act, 21 U.S.C. section 360bbb-3(b)(1), unless the authorization is terminated or revoked.  Performed at [facility], [place] [address] [place], [place] [address]   Respiratory (~20 pathogens) panel by PCR     Status: None   Collection Time: 10/07/22 11:45 PM   Specimen: Nasopharyngeal Swab; Respiratory  Result Value Ref Range Status   Adenovirus NOT DETECTED NOT DETECTED Final   Coronavirus 229E NOT DETECTED NOT DETECTED Final    Comment: (NOTE) The Coronavirus on the Respiratory Panel, DOES NOT test for the novel  Coronavirus (2019 nCoV)    Coronavirus HKU1 NOT DETECTED NOT DETECTED Final   Coronavirus NL63 NOT DETECTED NOT DETECTED Final   Coronavirus OC43 NOT DETECTED NOT DETECTED Final   Metapneumovirus NOT DETECTED NOT DETECTED Final   Rhinovirus / Enterovirus NOT DETECTED NOT DETECTED Final   Influenza A NOT DETECTED NOT DETECTED Final   Influenza B NOT DETECTED NOT DETECTED Final   Parainfluenza Virus 1 NOT DETECTED NOT DETECTED Final   Parainfluenza Virus 2 NOT DETECTED NOT DETECTED Final   Parainfluenza Virus 3 NOT DETECTED NOT DETECTED Final   Parainfluenza Virus 4 NOT DETECTED NOT DETECTED Final   Respiratory Syncytial Virus NOT DETECTED NOT DETECTED Final   Bordetella pertussis NOT DETECTED NOT DETECTED Final   Bordetella Parapertussis NOT DETECTED NOT DETECTED Final    Chlamydophila pneumoniae NOT DETECTED NOT DETECTED Final   Mycoplasma pneumoniae NOT DETECTED NOT DETECTED Final    Comment: Performed at [facility], [address] [place], [place] [address]     Labs: BNP (last 3 results) Recent Labs    02/06/22 0517 06/13/22 1010 10/07/22 2000  BNP 13.7 26.0 02.6   Basic Metabolic Panel: Recent Labs  Lab 10/07/22 2000 10/07/22 2010 10/07/22 2348 10/08/22 0422  NA 138 136  --  138  K 3.8 3.9  --  4.0  CL 101  --   --  101  CO2 27  --   --  23  GLUCOSE 129*  --   --  233*  BUN 8  --   --  11  CREATININE 0.71  --  0.81 0.78  CALCIUM 8.8*  --   --  8.5*   Liver Function Tests: Recent Labs  Lab 10/08/22 0422  AST 23  ALT 21  ALKPHOS 73  BILITOT <0.1*  PROT 6.1*  ALBUMIN 2.9*   No results for input(s): "LIPASE", "AMYLASE" in the last 168 hours. No results for input(s): "AMMONIA" in the last 168 hours. CBC: Recent Labs  Lab 10/07/22 2000 10/07/22 2010 10/08/22 0422  WBC 15.6*  --  14.4*  NEUTROABS 9.8*  --   --   HGB 10.0* 10.5* 9.4*  HCT 33.4* 31.0* 31.3*  MCV 86.8  --  86.9  PLT 413*  --  412*   Cardiac Enzymes: No results for input(s): "CKTOTAL", "CKMB", "CKMBINDEX", "TROPONINI" in the last 168 hours. BNP: Invalid input(s): "POCBNP" CBG: No results for input(s): "GLUCAP" in the last 168 hours. D-Dimer No results for input(s): "DDIMER" in the last 72 hours. Hgb A1c No results for input(s): "HGBA1C" in the last 72 hours. Lipid Profile No results for input(s): "CHOL", "HDL", "LDLCALC", "TRIG", "CHOLHDL", "LDLDIRECT" in the last 72 hours. Thyroid function studies No results for input(s): "TSH", "T4TOTAL", "T3FREE", "THYROIDAB" in the last 72 hours.  Invalid input(s): "FREET3" Anemia work up No results for input(s): "VITAMINB12", "FOLATE", "FERRITIN", "TIBC", "IRON", "RETICCTPCT" in the last 72 hours. Urinalysis    Component Value Date/Time   COLORURINE YELLOW 05/18/2022 1603   APPEARANCEUR CLEAR  05/18/2022 1603   LABSPEC 1.015 05/18/2022 1603   PHURINE 6.0 05/18/2022 1603   GLUCOSEU NEGATIVE 05/18/2022 1603   HGBUR NEGATIVE 05/18/2022 1603   BILIRUBINUR NEGATIVE 05/18/2022 1603   KETONESUR NEGATIVE 05/18/2022 1603   PROTEINUR NEGATIVE 05/05/2021 1922   UROBILINOGEN 0.2 05/18/2022 1603   NITRITE NEGATIVE 05/18/2022 1603   LEUKOCYTESUR NEGATIVE 05/18/2022 1603   Sepsis Labs Recent Labs  Lab 10/07/22 2000 10/08/22 0422  WBC 15.6* 14.4*   Microbiology Recent Results (from the past 240 hour(s))  Resp Panel by RT-PCR (Flu A&B, Covid) Nasopharyngeal Swab     Status: None   Collection Time: 10/07/22 11:45 PM   Specimen: Nasopharyngeal Swab; Nasal Swab  Result Value Ref Range Status   SARS Coronavirus 2 by RT PCR NEGATIVE NEGATIVE Final    Comment: (NOTE) SARS-CoV-2 target nucleic acids are NOT DETECTED.  The SARS-CoV-2 RNA is generally detectable in upper respiratory specimens during the acute phase of infection. The lowest concentration of SARS-CoV-2 viral copies this assay can detect is 138 copies/mL. A negative result does not preclude SARS-Cov-2 infection and should not be used as the sole basis for treatment or other patient management decisions. A negative result may occur with  improper specimen collection/handling, submission of specimen other than nasopharyngeal swab, presence of viral mutation(s) within the areas targeted by this assay, and inadequate number of viral copies(<138 copies/mL). A negative result must be combined with clinical observations, patient  history, and epidemiological information. The expected result is Negative.  Fact Sheet for Patients:  EntrepreneurPulse.com.au  Fact Sheet for Healthcare Providers:  IncredibleEmployment.be  This test is no t yet approved or cleared by the [place] FDA and  has been authorized for detection and/or diagnosis of SARS-CoV-2 by FDA under an Emergency Use  Authorization (EUA). This EUA will remain  in effect (meaning this test can be used) for the duration of the COVID-19 declaration under Section 564(b)(1) of the Act, 21 U.S.C.section 360bbb-3(b)(1), unless the authorization is terminated  or revoked sooner.       Influenza A by PCR NEGATIVE NEGATIVE Final   Influenza B by PCR NEGATIVE NEGATIVE Final    Comment: (NOTE) The Xpert Xpress SARS-CoV-2/FLU/RSV plus assay is intended as an aid in the diagnosis of influenza from Nasopharyngeal swab specimens and should not be used as a sole basis for treatment. Nasal washings and aspirates are unacceptable for Xpert Xpress SARS-CoV-2/FLU/RSV testing.  Fact Sheet for Patients: EntrepreneurPulse.com.au  Fact Sheet for Healthcare Providers: IncredibleEmployment.be  This test is not yet approved or cleared by the [place] FDA and has been authorized for detection and/or diagnosis of SARS-CoV-2 by FDA under an Emergency Use Authorization (EUA). This EUA will remain in effect (meaning this test can be used) for the duration of the COVID-19 declaration under Section 564(b)(1) of the Act, 21 U.S.C. section 360bbb-3(b)(1), unless the authorization is terminated or revoked.  Performed at [facility], [place] [address], [place], [place] [address]   Respiratory (~20 pathogens) panel by PCR     Status: None   Collection Time: 10/07/22 11:45 PM   Specimen: Nasopharyngeal Swab; Respiratory  Result Value Ref Range Status   Adenovirus NOT DETECTED NOT DETECTED Final   Coronavirus 229E NOT DETECTED NOT DETECTED Final    Comment: (NOTE) The Coronavirus on the Respiratory Panel, DOES NOT test for the novel  Coronavirus (2019 nCoV)    Coronavirus HKU1 NOT DETECTED NOT DETECTED Final   Coronavirus NL63 NOT DETECTED NOT DETECTED Final   Coronavirus OC43 NOT DETECTED NOT DETECTED Final   Metapneumovirus NOT DETECTED NOT DETECTED Final   Rhinovirus /  Enterovirus NOT DETECTED NOT DETECTED Final   Influenza A NOT DETECTED NOT DETECTED Final   Influenza B NOT DETECTED NOT DETECTED Final   Parainfluenza Virus 1 NOT DETECTED NOT DETECTED Final   Parainfluenza Virus 2 NOT DETECTED NOT DETECTED Final   Parainfluenza Virus 3 NOT DETECTED NOT DETECTED Final   Parainfluenza Virus 4 NOT DETECTED NOT DETECTED Final   Respiratory Syncytial Virus NOT DETECTED NOT DETECTED Final   Bordetella pertussis NOT DETECTED NOT DETECTED Final   Bordetella Parapertussis NOT DETECTED NOT DETECTED Final   Chlamydophila pneumoniae NOT DETECTED NOT DETECTED Final   Mycoplasma pneumoniae NOT DETECTED NOT DETECTED Final    Comment: Performed at [facility], [place]. [address] [place], [place] [address]     Time coordinating discharge: Over 30 minutes  SIGNED:   Darliss Cheney, MD  [facility] 10/08/2022, 11:44 AM *Please note that this is a verbal dictation therefore any spelling or grammatical errors are due to the "[place] [employer]" system interpretation. If 7PM-7AM, please contact night-coverage [email or web address]

## 2022-10-08 NOTE — Evaluation (Addendum)
Occupational Therapy Evaluation Patient Details Name: [name] MRN: [id number] DOB: 07-Nov-1961 Today's Date: 10/08/2022   History of Present Illness Pt is a 61 y.o. female admitted 10/22 with acute on chronic respiratory failue. PMH:  asthma COPD overlap syndrome, fibromyalgia, seizure disorder, bipolar disorder, GERD, iron deficiency anemia, peripheral vascular disease, depression with anxiety   Clinical Impression   PTA, pt lives alone, typically Modified Independent with ADLs, basic IADLs, driving and mobility using cane vs RW. Pt presents now feeling much better than yesterday. Pt able to mobilize in hallway/to bathroom using RW with Supervision. Pt overall Modified Independent for UB ADLs and requires no more than Supervision for LB ADLs. Dicussed tub transfer safety with pt considering purchasing a shower chair for use at home. Feel pt will quickly return to PLOF without need for OT follow up. If pt to remain admitted and transfer to floor, would benefit from mobility specialist's service to maximize endurance/mobility.    SpO2 94% on RA post activity, HR low 100s     Recommendations for follow up therapy are one component of a multi-disciplinary discharge planning process, led by the attending physician.  Recommendations may be updated based on patient status, additional functional criteria and insurance authorization.   Follow Up Recommendations  No OT follow up    Assistance Recommended at Discharge PRN  Patient can return home with the following      Functional Status Assessment  Patient has not had a recent decline in their functional status  Equipment Recommendations  None recommended by OT    Recommendations for Other Services       Precautions / Restrictions Precautions Precautions: Fall Restrictions Weight Bearing Restrictions: No      Mobility Bed Mobility Overal bed mobility: Modified Independent Bed Mobility: Supine to Sit, Sit to Supine            General bed mobility comments: increased time/effort    Transfers Overall transfer level: Modified independent Equipment used: Rolling walker (2 wheels) Transfers: Sit to/from Stand Sit to Stand: Modified independent (Device/Increase time)                  Balance Overall balance assessment: Needs assistance Sitting-balance support: No upper extremity supported, Feet supported Sitting balance-Leahy Scale: Good     Standing balance support: Single extremity supported, No upper extremity supported, During functional activity Standing balance-Leahy Scale: Fair                             ADL either performed or assessed with clinical judgement   ADL Overall ADL's : Needs assistance/impaired Eating/Feeding: Independent   Grooming: Modified independent;Standing;Oral care Grooming Details (indicate cue type and reason): no LOB or safety concerns Upper Body Bathing: Modified independent   Lower Body Bathing: Supervison/ safety   Upper Body Dressing : Modified independent   Lower Body Dressing: Supervision/safety   Toilet Transfer: Supervision/safety;Ambulation;Rolling walker (2 wheels) Toilet Transfer Details (indicate cue type and reason): to/from bathroom in hallway. assist for IV line mgmt/safety Toileting- Clothing Manipulation and Hygiene: Modified independent;Sitting/lateral lean;Sit to/from Nurse, children's Details (indicate cue type and reason): reports some difficulty with tub transfers, discussed places where she could purchase shower chair Functional mobility during ADLs: Supervision/safety;Rolling walker (2 wheels) General ADL Comments: Appears fairly close to baseline, does indicate some concerns with shower transfers.     Vision Ability to See in Adequate Light: 0 Adequate Patient Visual Report:  No change from baseline Vision Assessment?: No apparent visual deficits     Perception     Praxis      Pertinent  Vitals/Pain Pain Assessment Pain Assessment: No/denies pain     Hand Dominance Right   Extremity/Trunk Assessment Upper Extremity Assessment Upper Extremity Assessment: Overall WFL for tasks assessed   Lower Extremity Assessment Lower Extremity Assessment: Defer to PT evaluation   Cervical / Trunk Assessment Cervical / Trunk Assessment: Normal   Communication Communication Communication: No difficulties   Cognition Arousal/Alertness: Awake/alert Behavior During Therapy: WFL for tasks assessed/performed Overall Cognitive Status: Within Functional Limits for tasks assessed                                       General Comments  SpO2 94%on RA after ADls/mobility, HR low 100s    Exercises     Shoulder Instructions      Home Living Family/patient expects to be discharged to:: Private residence Living Arrangements: Alone Available Help at Discharge: Family;Available PRN/intermittently Type of Home: Apartment Home Access: Level entry     Home Layout: One level     Bathroom Shower/Tub: Teacher, early years/pre: Standard     Home Equipment: Conservation officer, nature (2 wheels);Kasandra Knudsen - single point   Additional Comments: reports two daughters, 42 yo grandson and 63 yo granddaughter can (A) PRN.      Prior Functioning/Environment Prior Level of Function : Independent/Modified Independent;History of Falls (last six months);Driving             Mobility Comments: uses RW and cane as needed ADLs Comments: independent ADLs, IADLs limited (daughter assists), driving        OT Problem List: Decreased activity tolerance      OT Treatment/Interventions:      OT Goals(Current goals can be found in the care plan section) Acute Rehab OT Goals Patient Stated Goal: thankful to be feeling better, hopeful to move to smoke free apartment soon OT Goal Formulation: All assessment and education complete, DC therapy  OT Frequency:      Co-evaluation               AM-PAC OT "6 Clicks" Daily Activity     Outcome Measure Help from another person eating meals?: None Help from another person taking care of personal grooming?: None Help from another person toileting, which includes using toliet, bedpan, or urinal?: None Help from another person bathing (including washing, rinsing, drying)?: A Little Help from another person to put on and taking off regular upper body clothing?: None Help from another person to put on and taking off regular lower body clothing?: A Little 6 Click Score: 22   End of Session Equipment Utilized During Treatment: Rolling walker (2 wheels) Nurse Communication: Mobility status  Activity Tolerance: Patient tolerated treatment well Patient left: in bed;with call bell/phone within reach  OT Visit Diagnosis: Other abnormalities of gait and mobility (R26.89)                Time: 4097-3532 OT Time Calculation (min): 22 min Charges:  OT General Charges $OT Visit: 1 Visit OT Evaluation $OT Eval Low Complexity: 1 Low  Malachy Chamber, OTR/L Acute Rehab Services Office: [phone]   Layla Maw 10/08/2022, 11:07 AM

## 2022-10-08 NOTE — ED Notes (Signed)
Per RT pt labs show that she is over oxygenating. Per RT rec trial pt off of bipap and on room air. Pt is okay with this. Pt trialed off of bipap prior to going to CT. Pt satting 96% on room air comfortably. Pt taken to CT with this RN

## 2022-10-08 NOTE — ED Notes (Signed)
MD Jonelle Sidle notified that pt is off bipap and on room air. Pt asking to eat and drink. Per MD Jonelle Sidle see how pt does off of room air for an hour and then pt can drink

## 2022-10-08 NOTE — ED Notes (Signed)
O2 was 98% on RA while ambulating with PT. Pt tolerated well. Will continue to monitor.

## 2022-10-08 NOTE — Telephone Encounter (Signed)
Approval letter attached in patient documents.

## 2022-10-08 NOTE — Progress Notes (Signed)
RT removed pt from bipap at this time for trial.  RT plans to readdress pressures when pt is back from CT for nighttime usage of cpap.

## 2022-10-08 NOTE — Evaluation (Addendum)
Physical Therapy Evaluation Patient Details Name: [name] MRN: [id number] DOB: [birth year] Today's Date: 10/08/2022  History of Present Illness  Pt is a 61 y.o. female admitted 10/22 with acute on chronic respiratory failue. PMH:  asthma COPD overlap syndrome, fibromyalgia, seizure disorder, bipolar disorder, GERD, iron deficiency anemia, peripheral vascular disease, depression with anxiety   Clinical Impression  Pt admitted with above diagnosis. PTA pt lived at home alone in first floor apt, I/mod I mobility and ADLs. Family assists with iADLs. Pt currently with functional limitations due to the deficits listed below (see PT Problem List). On eval, pt required min guard assist bed mobility, min assist transfers, and min/HHA progressing to min guard assist amb 200' pushing IV pole. Pt will benefit from skilled PT to increase their independence and safety with mobility to allow discharge home. Pt declining HHPT, stating it has not benefited her in the past. PT to follow acutely. Pt encouraged to use her RW more at home to help prevent falls.          Recommendations for follow up therapy are one component of a multi-disciplinary discharge planning process, led by the attending physician.  Recommendations may be updated based on patient status, additional functional criteria and insurance authorization.  Follow Up Recommendations No PT follow up      Assistance Recommended at Discharge None  Patient can return home with the following       Equipment Recommendations None recommended by PT  Recommendations for Other Services       Functional Status Assessment Patient has had a recent decline in their functional status and demonstrates the ability to make significant improvements in function in a reasonable and predictable amount of time.     Precautions / Restrictions Precautions Precautions: Fall Precaution Comments: Pt reports multi falls at home.      Mobility   Bed Mobility Overal bed mobility: Needs Assistance Bed Mobility: Supine to Sit, Sit to Supine     Supine to sit: Min guard Sit to supine: Min guard   General bed mobility comments: min guard for safety due to high stretcher in ED    Transfers Overall transfer level: Needs assistance Equipment used: None Transfers: Sit to/from Stand, Bed to chair/wheelchair/BSC Sit to Stand: Min assist   Step pivot transfers: Min assist       General transfer comment: increased time to power up and stabilize balance. BLE tremors/shaking with first standing trial. No shaking noted with subsequent trials.    Ambulation/Gait Ambulation/Gait assistance: Min assist, Min guard Gait Distance (Feet): 200 Feet Assistive device: IV Pole, 1 person hand held assist Gait Pattern/deviations: Step-through pattern, Decreased stride length Gait velocity: WFL Gait velocity interpretation: >2.62 ft/sec, indicative of community ambulatory   General Gait Details: initially requiring min/HHA but progressed to min guard. SpO2 98% on RA.  Stairs            Wheelchair Mobility    Modified Rankin (Stroke Patients Only)       Balance Overall balance assessment: Needs assistance Sitting-balance support: No upper extremity supported, Feet supported Sitting balance-Leahy Scale: Good     Standing balance support: Single extremity supported, No upper extremity supported, During functional activity Standing balance-Leahy Scale: Fair                               Pertinent Vitals/Pain Pain Assessment Pain Assessment: No/denies pain    Home Living Family/patient expects to  be discharged to:: Private residence Living Arrangements: Alone Available Help at Discharge: Family;Available PRN/intermittently Type of Home: Apartment Home Access: Level entry       Home Layout: One level Home Equipment: Conservation officer, nature (2 wheels);Kasandra Knudsen - single point Additional Comments: reports two daughters, 70  yo grandson and 60 yo granddaughter can (A) PRN.    Prior Function Prior Level of Function : Independent/Modified Independent;History of Falls (last six months);Driving             Mobility Comments: uses RW and cane as needed       Hand Dominance   Dominant Hand: Right    Extremity/Trunk Assessment   Upper Extremity Assessment Upper Extremity Assessment: Defer to OT evaluation    Lower Extremity Assessment Lower Extremity Assessment: Overall WFL for tasks assessed    Cervical / Trunk Assessment Cervical / Trunk Assessment: Normal  Communication   Communication: No difficulties  Cognition Arousal/Alertness: Awake/alert Behavior During Therapy: WFL for tasks assessed/performed Overall Cognitive Status: Within Functional Limits for tasks assessed                                          General Comments General comments (skin integrity, edema, etc.): SpO2 98% on RA during amb    Exercises     Assessment/Plan    PT Assessment Patient needs continued PT services  PT Problem List Decreased mobility;Decreased activity tolerance;Decreased balance;Cardiopulmonary status limiting activity       PT Treatment Interventions Therapeutic exercise;Gait training;Balance training;Functional mobility training;Therapeutic activities;Patient/family education    PT Goals (Current goals can be found in the Care Plan section)  Acute Rehab PT Goals Patient Stated Goal: home PT Goal Formulation: With patient Time For Goal Achievement: 10/22/22 Potential to Achieve Goals: Good    Frequency Min 3X/week     Co-evaluation               AM-PAC PT "6 Clicks" Mobility  Outcome Measure Help needed turning from your back to your side while in a flat bed without using bedrails?: None Help needed moving from lying on your back to sitting on the side of a flat bed without using bedrails?: A Little Help needed moving to and from a bed to a chair (including a  wheelchair)?: A Little Help needed standing up from a chair using your arms (e.g., wheelchair or bedside chair)?: A Little Help needed to walk in hospital room?: A Little Help needed climbing 3-5 steps with a railing? : A Little 6 Click Score: 19    End of Session Equipment Utilized During Treatment: Gait belt Activity Tolerance: Patient tolerated treatment well Patient left: in bed;with call bell/phone within reach Nurse Communication: Mobility status PT Visit Diagnosis: Difficulty in walking, not elsewhere classified (R26.2)    Time: 5053-9767 PT Time Calculation (min) (ACUTE ONLY): 23 min   Charges:   PT Evaluation $PT Eval Moderate Complexity: 1 Mod PT Treatments $Gait Training: 8-22 mins        Lorrin Goodell, PT  Office # [phone] Pager [phone]   Lorriane Shire 10/08/2022, 11:00 AM

## 2022-10-09 ENCOUNTER — Telehealth: Payer: Self-pay

## 2022-10-09 ENCOUNTER — Other Ambulatory Visit (HOSPITAL_COMMUNITY): Payer: Self-pay

## 2022-10-09 NOTE — Telephone Encounter (Signed)
Transition Care Management Follow-up Telephone Call Date of discharge and from where:[facility] 10-08-22 Dx: COPD Exac   How have you been since you were released from the hospital? Not feeling much better- waiting on medicine from pharmacy  Any questions or concerns? No  Items Reviewed: Did the pt receive and understand the discharge instructions provided? Yes  Medications obtained and verified? Yes  Other? Yes  Any new allergies since your discharge? No  Dietary orders reviewed? Yes Do you have support at home? Yes   Home Care and Equipment/Supplies: Were home health services ordered? no If so, what is the name of the agency? na  Has the agency set up a time to come to the patient's home? not applicable Were any new equipment or medical supplies ordered?  No What is the name of the medical supply agency? na Were you able to get the supplies/equipment? not applicable Do you have any questions related to the use of the equipment or supplies? No  Functional Questionnaire: (I = Independent and D = Dependent) ADLs: I  Bathing/Dressing- I  Meal Prep- I  Eating- I  Maintaining continence- I  Transferring/Ambulation- I  Managing Meds- I  Follow up appointments reviewed:  PCP Hospital f/u appt confirmed? Yes  [facility] Reichmann  [facility] Yes  Scheduled to see Princess Bruins on 10-15-22 @ 430pm. Are transportation arrangements needed? No  If their condition worsens, is the pt aware to call PCP or go to the Emergency Dept.? Yes Was the patient provided with contact information for the PCP's office or ED? Yes Was to pt encouraged to call back with questions or concerns? Yes   Juanda Crumble LPN [facility] Direct Dial [phone]

## 2022-10-15 ENCOUNTER — Encounter: Payer: Self-pay | Admitting: Obstetrics & Gynecology

## 2022-10-15 ENCOUNTER — Ambulatory Visit (INDEPENDENT_AMBULATORY_CARE_PROVIDER_SITE_OTHER): Payer: Medicare HMO | Admitting: Obstetrics & Gynecology

## 2022-10-15 VITALS — BP 118/68 | HR 66 | Ht 63.0 in | Wt 176.0 lb

## 2022-10-15 DIAGNOSIS — I10 Essential (primary) hypertension: Secondary | ICD-10-CM | POA: Diagnosis not present

## 2022-10-15 DIAGNOSIS — Z90710 Acquired absence of both cervix and uterus: Secondary | ICD-10-CM | POA: Diagnosis not present

## 2022-10-15 DIAGNOSIS — Z8673 Personal history of transient ischemic attack (TIA), and cerebral infarction without residual deficits: Secondary | ICD-10-CM

## 2022-10-15 DIAGNOSIS — Z79890 Hormone replacement therapy (postmenopausal): Secondary | ICD-10-CM

## 2022-10-15 NOTE — Progress Notes (Signed)
    [name] [birth year] [id number]        61 y.o.  [id number] Single  RP: Postmenopausal on HRT  HPI: Postmenopause on Estradiol 0.5 mg/tab 1 tab PO daily x 12/2017.  No vasomotor menopausal Sx on HRT.  Would like to stop HRT.  S/P Total Hysterectomy.  Currently abstinent.  Pap Neg 01/2018.  No pelvic pain.  Breasts normal.  Mammo Neg 07/16/2022.  Per chart, h/o stroke.  cHTN controled on Meds.  Annual Gyn exam 09/21/2021.   OB History  Gravida Para Term Preterm AB Living  '7 3 3   4 3  '$ SAB IAB Ectopic Multiple Live Births  3 1          # Outcome Date GA Lbr Len/2nd Weight Sex Delivery Anes PTL Lv  7 IAB           6 SAB           5 SAB           4 SAB           3 Term           2 Term           1 Term             Past medical history,surgical history, problem list, medications, allergies, family history and social history were all reviewed and documented in the EPIC chart.   Directed ROS with pertinent positives and negatives documented in the history of present illness/assessment and plan.  Exam:  Vitals:   10/15/22 1613  BP: 118/68  Pulse: 66  SpO2: 100%  Weight: 176 lb (79.8 kg)  Height: '5\' 3"'$  (1.6 m)   General appearance:  Normal  Breast exam:  Rt breast: Normal.  No Rt axillary LN felt.                        Lt breast: Normal.  No Lt axillary LN felt.    Gynecologic exam: Deferred   Assessment/Plan:  61 y.o. [id number]   1. Postmenopausal hormone replacement therapy Postmenopause on Estradiol 0.5 mg/tab 1 tab PO daily x 12/2017.  No vasomotor menopausal Sx on HRT.  Would like to stop HRT.  S/P Total Hysterectomy.  Currently abstinent.  Pap Neg 01/2018.  No pelvic pain.  Breasts normal.  Mammo Neg 07/16/2022.  Per chart, h/o stroke.  cHTN controled on Meds.  Annual Gyn exam 09/21/2021.  Given her h/o Stroke and cHTN, and at patient's preference, decision to stop HRT.  May use coconut oil if sexually active.  2. S/P total hysterectomy  3. History of  stroke  4. Essential (primary) hypertension  Controled on Medication.  Princess Bruins MD, 4:18 PM 10/15/2022

## 2022-10-16 ENCOUNTER — Ambulatory Visit (INDEPENDENT_AMBULATORY_CARE_PROVIDER_SITE_OTHER): Payer: Medicare HMO | Admitting: *Deleted

## 2022-10-16 DIAGNOSIS — J4550 Severe persistent asthma, uncomplicated: Secondary | ICD-10-CM | POA: Diagnosis not present

## 2022-10-18 ENCOUNTER — Other Ambulatory Visit: Payer: Self-pay

## 2022-10-18 ENCOUNTER — Ambulatory Visit (INDEPENDENT_AMBULATORY_CARE_PROVIDER_SITE_OTHER): Payer: Medicare HMO | Admitting: Internal Medicine

## 2022-10-18 ENCOUNTER — Telehealth: Payer: Self-pay

## 2022-10-18 ENCOUNTER — Other Ambulatory Visit (HOSPITAL_COMMUNITY): Payer: Self-pay

## 2022-10-18 ENCOUNTER — Ambulatory Visit (INDEPENDENT_AMBULATORY_CARE_PROVIDER_SITE_OTHER): Payer: Medicare HMO | Admitting: Allergy & Immunology

## 2022-10-18 ENCOUNTER — Encounter: Payer: Self-pay | Admitting: Allergy & Immunology

## 2022-10-18 VITALS — BP 120/66 | HR 78 | Temp 98.3°F | Ht 63.0 in | Wt 175.0 lb

## 2022-10-18 VITALS — BP 110/68 | HR 95 | Temp 98.0°F | Resp 18

## 2022-10-18 DIAGNOSIS — L508 Other urticaria: Secondary | ICD-10-CM | POA: Diagnosis not present

## 2022-10-18 DIAGNOSIS — E559 Vitamin D deficiency, unspecified: Secondary | ICD-10-CM | POA: Diagnosis not present

## 2022-10-18 DIAGNOSIS — K219 Gastro-esophageal reflux disease without esophagitis: Secondary | ICD-10-CM | POA: Diagnosis not present

## 2022-10-18 DIAGNOSIS — R7303 Prediabetes: Secondary | ICD-10-CM

## 2022-10-18 DIAGNOSIS — B999 Unspecified infectious disease: Secondary | ICD-10-CM

## 2022-10-18 DIAGNOSIS — J441 Chronic obstructive pulmonary disease with (acute) exacerbation: Secondary | ICD-10-CM | POA: Diagnosis not present

## 2022-10-18 DIAGNOSIS — J4550 Severe persistent asthma, uncomplicated: Secondary | ICD-10-CM

## 2022-10-18 DIAGNOSIS — J3089 Other allergic rhinitis: Secondary | ICD-10-CM | POA: Diagnosis not present

## 2022-10-18 MED ORDER — ALBUTEROL SULFATE HFA 108 (90 BASE) MCG/ACT IN AERS: 2.0000 | INHALATION_SPRAY | Freq: Four times a day (QID) | RESPIRATORY_TRACT | 1 refills | Status: DC | PRN

## 2022-10-18 MED ORDER — BUDESONIDE 0.5 MG/2ML IN SUSP: RESPIRATORY_TRACT | 5 refills | Status: DC

## 2022-10-18 MED ORDER — FORMOTEROL FUMARATE 20 MCG/2ML IN NEBU: INHALATION_SOLUTION | RESPIRATORY_TRACT | 5 refills | Status: DC

## 2022-10-18 MED ORDER — YUPELRI 175 MCG/3ML IN SOLN: 175.0000 ug | Freq: Every day | RESPIRATORY_TRACT | 5 refills | Status: DC

## 2022-10-18 MED ORDER — MONTELUKAST SODIUM 10 MG PO TABS: 10.0000 mg | ORAL_TABLET | Freq: Every day | ORAL | 1 refills | Status: DC

## 2022-10-18 NOTE — Telephone Encounter (Signed)
PA request received from [facility] through [facility] for Formoterol Fumarate 20MCG/2ML nebulizer solution  PA has been submitted and is awaiting determination.   Key[employer]urgess Estelle - PA Case ID: [id number]

## 2022-10-18 NOTE — Progress Notes (Signed)
Patient ID: [name], female   DOB: November 23, 1961, 62 y.o.   MRN: [id number]        Chief Complaint: follow up post hospn oct 22-23       HPI:  [name] is a 61 y.o. female here after very brief respiratory failure and improvement next day; required bipap to start, tx with IVF and IV steroid, but rapidly improved and able to ambulate on RA with adequate sat at d/c with prednisone, antibx.  Now Finished prednisone and antibx.  Saw allergy today with some med changes, and overall feels much improved.  No other specific complaints Denies worsening reflux, abd pain, dysphagia, n/v, bowel change or blood.  Transitional Care Management elements noted today: 1)  Date of D/C: as above 2)  Medication reconciliation:  done today at end visit 3)  Review of D/C summary or other information:  done today 4)  Review of need for f/u on pending diagnostic tests and treatments:  done today - none needed 5)  Review of need for Interaction with other providers who will assume or resume care of pt specific problems: done today - already seen per allergy today as pulmonary not immediately available 6)  Education of patient/family/guardian or caregiver: none needed  Wt Readings from Last 3 Encounters:  10/18/22 175 lb (79.4 kg)  10/15/22 176 lb (79.8 kg)  09/21/22 174 lb 12.8 oz (79.3 kg)   BP Readings from Last 3 Encounters:  10/18/22 120/66  10/18/22 110/68  10/15/22 118/68         Past Medical History:  Diagnosis Date   Anemia, iron deficiency 12/22/2014   pt. 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      Bipolar disorder ([place])    COPD (chronic obstructive pulmonary disease) ([place])    Depression    Eczema 05/18/2022   Fibromyalgia 05/14/2014   GERD (gastroesophageal  reflux disease)    History of kidney stones    Hyperlipidemia 04/20/2017   HYPERTENSION 05/12/2009   Qualifier: Diagnosis of  By: Lenn Cal Deborra Medina), [name]     Kidney stones    Peripheral vascular disease ([place])    Pneumonia    Prediabetes 02/23/2014   pt. denies   Seizure ([place])    Stroke ([place]) 11/2020   Urticaria    Past Surgical History:  Procedure Laterality Date   ABDOMINAL HYSTERECTOMY     ANTERIOR CERVICAL DECOMP/DISCECTOMY FUSION N/A 07/28/2020   Procedure: ANTERIOR CERVICAL DECOMPRESSION/DISCECTOMY FUSION. INTERBODY PROTHESIS, PLATE/SCREWS CERVICAL THREE- CERVICAL FOUR, CERVICAL FOUR- CERVICAL FIVE;  Surgeon: Newman Pies, MD;  Location: [facility];  Service: Neurosurgery;  Laterality: N/A;   BACK SURGERY     COLONOSCOPY  12/20/2011   Procedure: COLONOSCOPY;  Surgeon: Landry Dyke, MD;  Location: [facility];  Service: Endoscopy;  Laterality: N/A;   COLONOSCOPY  03/05/2012   Procedure: COLONOSCOPY;  Surgeon: Landry Dyke, MD;  Location: [facility];  Service: Endoscopy;  Laterality: N/A;   DIAGNOSTIC LAPAROSCOPY     HEMORRHOID SURGERY     INCISE AND DRAIN ABCESS     KIDNEY STONE SURGERY     NECK SURGERY     x 2 Dr Orinda Kenner   SPINE SURGERY  2019   TOE SURGERY     TUBAL LIGATION  reports that she quit smoking about 3 years ago. Her smoking use included cigarettes. She has a 9.00 pack-year smoking history. She has never used smokeless tobacco. She reports that she does not currently use alcohol. She reports current drug use. Drug: Marijuana. family history includes Alcohol abuse in her brother and father; Anemia in her daughter, sister, and sister; Anesthesia problems in her father; Asthma in her brother, mother, and sister; Brain cancer in her sister; Breast cancer (age of onset: 97) in her sister; COPD in her mother and sister; Colon cancer (age of onset: 4) in her father; Diabetes in her brother, father, mother, sister, sister, sister, and sister; Heart disease in  her brother, brother, mother, and sister; Hypertension in her brother, daughter, sister, sister, sister, sister, and sister; Kidney disease in her father; Sleep apnea in her brother. Allergies  Allergen Reactions   Belsomra [Suvorexant] Other (See Comments)    hallucinations   Sulfa Antibiotics Nausea And Vomiting   Venofer [Iron Sucrose] Hives   Current Outpatient Medications on File Prior to Visit  Medication Sig Dispense Refill   albuterol (PROVENTIL) (2.5 MG/3ML) 0.083% nebulizer solution Take 3 mLs (2.5 mg total) by nebulization every 6 (six) hours as needed for wheezing or shortness of breath. 120 mL 0   albuterol (VENTOLIN HFA) 108 (90 Base) MCG/ACT inhaler Inhale 2 puffs into the lungs every 4 (four) hours as needed for wheezing or shortness of breath. 18 g 11   atorvastatin (LIPITOR) 40 MG tablet Take 1 tablet (40 mg total) by mouth daily. TAKE 1 TABLET(40 MG) BY MOUTH DAILY Strength: 40 mg (Patient taking differently: Take 40 mg by mouth daily.) 90 tablet 3   azelastine (OPTIVAR) 0.05 % ophthalmic solution Place 1 drop into both eyes 2 (two) times daily. 6 mL 12   BELSOMRA 15 MG TABS Take 15 mg by mouth at bedtime as needed (sleep).     butalbital-acetaminophen-caffeine (FIORICET) 50-325-40 MG tablet 1 tab by mouth once daily as needed for HA, limit 14 per month 14 tablet 0   carbamazepine (TEGRETOL) 200 MG tablet Take 2 tablets (400 mg total) by mouth 2 (two) times daily. 120 tablet 12   cetirizine (ZYRTEC) 10 MG tablet Can take one tablet by mouth once daily if needed for runny nose. (Patient taking differently: Take 10 mg by mouth daily as needed for rhinitis.) 30 tablet 5   Cholecalciferol (VITAMIN D) 125 MCG (5000 UT) CAPS Take 5,000 Units by mouth daily.     diclofenac Sodium (VOLTAREN) 1 % GEL Apply 4 g topically 4 (four) times daily. (Patient taking differently: Apply 4 g topically 4 (four) times daily as needed (pain).) 100 g 0   DUPIXENT 300 MG/2ML prefilled syringe Inject 300  mg into the skin every 28 (twenty-eight) days.     ferrous sulfate 325 (65 FE) MG EC tablet Take 1 tablet (325 mg total) by mouth daily with breakfast. 90 tablet 1   gabapentin (NEURONTIN) 100 MG capsule Take 1 capsule (100 mg total) by mouth 3 (three) times daily. 90 capsule 3   hydrochlorothiazide (MICROZIDE) 12.5 MG capsule Take 1 capsule (12.5 mg total) by mouth daily. TAKE 1 CAPSULE(12.5 MG) BY MOUTH DAILY Strength: 12.5 mg (Patient taking differently: Take 12.5 mg by mouth daily.) 90 capsule 3   hydrOXYzine (VISTARIL) 25 MG capsule Take 1 capsule (25 mg total) by mouth every 6 (six) hours as needed for anxiety. 25 capsule 4   irbesartan (AVAPRO) 75 MG tablet TAKE 1 TABLET(75 MG)  BY MOUTH DAILY 90 tablet 1   ondansetron (ZOFRAN ODT) 4 MG disintegrating tablet Take 1 tablet (4 mg total) by mouth every 8 (eight) hours as needed for nausea or vomiting. 20 tablet 0   pantoprazole (PROTONIX) 40 MG tablet TAKE 1 TABLET BY MOUTH EVERY DAY (Patient taking differently: Take 40 mg by mouth daily.) 90 tablet 1   potassium chloride (KLOR-CON M) 10 MEQ tablet Take 1 tablet (10 mEq total) by mouth daily. 90 tablet 3   Skin Protectants, Misc. (EUCERIN) cream Apply 1 Application topically daily as needed for dry skin.     tacrolimus (PROTOPIC) 0.03 % ointment Apply topically 2 (two) times daily. (Patient taking differently: Apply 1 Application topically 2 (two) times daily as needed (rash).) 100 g 0   Current Facility-Administered Medications on File Prior to Visit  Medication Dose Route Frequency Provider Last Rate Last Admin   dupilumab (DUPIXENT) prefilled syringe 300 mg  300 mg Subcutaneous Q14 Days Valentina Shaggy, MD   300 mg at 10/16/22 1119        ROS:  All others reviewed and negative.  Objective        PE:  BP 120/66 (BP Location: Right Arm, Patient Position: Sitting, Cuff Size: Large)   Pulse 78   Temp 98.3 F (36.8 C) (Oral)   Ht '5\' 3"'$  (1.6 m)   Wt 175 lb (79.4 kg)   SpO2 96%   BMI  31.00 kg/m                 Constitutional: Pt appears in NAD               HENT: Head: NCAT.                Right Ear: External ear normal.                 Left Ear: External ear normal.                Eyes: . Pupils are equal, round, and reactive to light. Conjunctivae and EOM are normal               Nose: without d/c or deformity               Neck: Neck supple. Gross normal ROM               Cardiovascular: Normal rate and regular rhythm.                 Pulmonary/Chest: Effort normal and breath sounds without rales or wheezing.                Abd:  Soft, NT, ND, + BS, no organomegaly               Neurological: Pt is alert. At baseline orientation, motor grossly intact               Skin: Skin is warm. No rashes, no other new lesions, LE edema - one               Psychiatric: Pt behavior is normal without agitation   Micro: none  Cardiac tracings I have personally interpreted today:  none  Pertinent Radiological findings (summarize): none   Lab Results  Component Value Date   WBC 10.4 10/18/2022   HGB 11.1 10/18/2022   HCT 34.4 10/18/2022   PLT 431 10/18/2022   GLUCOSE 233 (H) 10/08/2022   CHOL 186 05/18/2022  TRIG 55.0 05/18/2022   HDL 96.00 05/18/2022   LDLCALC 79 05/18/2022   ALT 21 10/08/2022   AST 23 10/08/2022   NA 138 10/08/2022   K 4.0 10/08/2022   CL 101 10/08/2022   CREATININE 0.78 10/08/2022   BUN 11 10/08/2022   CO2 23 10/08/2022   TSH 1.16 09/13/2022   INR 0.9 02/06/2022   HGBA1C 6.5 09/13/2022   MICROALBUR 0.9 04/11/2021   Assessment/Plan:  Arcelia Pals is a 61 y.o. Black or African American [2] female with  has a past medical history of Anemia, iron deficiency (12/22/2014), Anxiety, Arthritis, ASTHMA (05/12/2009), Bipolar disorder ([place]), COPD (chronic obstructive pulmonary disease) ([place]), Depression, Eczema (05/18/2022), Fibromyalgia (05/14/2014), GERD (gastroesophageal reflux disease), History of kidney stones, Hyperlipidemia  (04/20/2017), HYPERTENSION (05/12/2009), Kidney stones, Peripheral vascular disease ([place]), Pneumonia, Prediabetes (02/23/2014), Seizure ([place]), Stroke ([place]) (11/2020), and Urticaria.  COPD/asthma Overall stable, cont current med tx as per allergy today  Vitamin D deficiency Last vitamin D Lab Results  Component Value Date   VD25OH 29.50 (L) 09/13/2022   Low, reminded to start oral replacement   Prediabetes Lab Results  Component Value Date   HGBA1C 6.5 09/13/2022   Stable, pt to continue current medical treatment - diet, wt control, excercise   Gastroesophageal reflux disease Stable overall, cont pepcid 20 qd prn  Followup: Return in about 1 month (around 11/20/2022).  Cathlean Cower, MD 10/19/2022 4:49 PM [facility]

## 2022-10-18 NOTE — Patient Instructions (Addendum)
1. Asthma-COPD overlap syndrome - Lung testing looked stable today. - We are going to temporarily change you to nebulized controller medications. - STOP the Breztri.  - We are going to check up in one month to see how you are doing with this. - We may change your injectable asthma medication at that point if you are continuing to have problems.  - Daily controller medication(s): Pulmicort (budesonide) + Perforomist (formoterol) mixed into a nebulizer twice daily + Yupelri nebulized once daily + Dupixent every two weeks - Prior to physical activity: Proventil (ORANAGE INHALER) 2 puffs or Ventolin (GRAY INHALER) 2 puffs 10-15 minutes before physical activity. - Rescue medications: Proventil (ORANAGE INHALER) 2 puffs or Ventolin (GRAY INHALER) 2 puffs AND 2 puffs Atrovent (GREEN INHALER) every 4-6 hours as needed - Asthma control goals:  * Full participation in all desired activities (may need albuterol before activity) * Albuterol use two time or less a week on average (not counting use with activity) * Cough interfering with sleep two time or less a month * Oral steroids no more than once a year * No hospitalizations   2. Chronic urticaria - under good control - Continue with levocetirizine twice daily AS NEEDED. - Continue with famotidine twice daily AS NEEDED.  - Continue with Doxepin '25mg'$  at night to help with itching AS NEEDED.   3. 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 - Continue with allergy shots at the same schedule.  - We are going to get some immune labs as well (we last checked your immune system in 2020, I believe).   4. Follow up in one month.   Please inform us of any Emergency Department visits, hospitalizations, or changes in symptoms. Call us before going to the ED for breathing or allergy symptoms since we might be able to fit you in for a sick visit. Feel free to contact us anytime with any  questions, problems, or concerns.  It was a pleasure to see you again today!  Websites that have reliable patient information:  1. American Academy of Asthma, Allergy, and Immunology: www.aaaai.org 2. Food Allergy Research and Education (FARE): foodallergy.org 3. Mothers of Asthmatics: http://www.asthmacommunitynetwork.org 4. American College of Allergy, Asthma, and Immunology: www.acaai.org   COVID-19 Vaccine Information can be found at: ShippingScam.co.uk For questions related to vaccine distribution or appointments, please email [email or web address] or call [phone].     "Like" [place] on Facebook and Instagram for our latest updates!   .  They are in [place], [place],.    Make sure you are registered to vote! If you have moved or changed any of your contact information, you will need to get this updated before voting!  In some cases, you MAY be able to register to vote online: [email or web address]

## 2022-10-18 NOTE — Patient Instructions (Addendum)
Please continue all other medications as before.  Please have the pharmacy call with any other refills you may need.  Please continue your efforts at being more active, low cholesterol diet, and weight control.  Please keep your appointments with your specialists as you may have planned  Please make an Appointment to return in Dec 5, or sooner if needed

## 2022-10-18 NOTE — Progress Notes (Signed)
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 chest CT next visit)   Never smoker   Recurrent admissions for breathing issues with hypercapnia and hypoxia- thought to be secondary to untreated obstructive sleep apnea (now on BiPAP with good results)   Normal chest CT (March 2023)    Perennial allergic rhinitis (dust mites) - on allergen immunotherapy   Chronic urticaria - controlled with antihistamines alone   Recurrent infections - with largely normal workup in 8299  Complicated past medical history, including disability as well as bipolar 1 depression and vitamin D deficiency  Plan/Recommendations:   1. Asthma-COPD overlap syndrome - Lung testing looked stable today. - We are going to temporarily change you to nebulized controller medications. - STOP the Breztri.  - We are going to check up in one month to see how you are doing with this. - We may change your injectable asthma medication at that point if you are continuing to have problems.  - Daily controller medication(s): Pulmicort (budesonide) + Perforomist (formoterol) mixed into a nebulizer twice daily + Yupelri nebulized once daily + Dupixent every two weeks - Prior to physical activity: Proventil (ORANAGE INHALER) 2 puffs or Ventolin (GRAY INHALER) 2 puffs 10-15 minutes before physical activity. - Rescue medications: Proventil (ORANAGE INHALER) 2 puffs or Ventolin (GRAY INHALER) 2 puffs AND 2 puffs Atrovent (GREEN INHALER) every 4-6 hours as needed - Asthma control goals:  * Full participation in all desired activities (may need albuterol before activity) * Albuterol use two time or less a week on average (not counting use with activity) * Cough interfering with sleep two time or less a month * Oral steroids no more than once a year * No hospitalizations   2. Chronic urticaria - under good control - Continue with  levocetirizine twice daily AS NEEDED. - Continue with famotidine twice daily AS NEEDED.  - Continue with Doxepin '25mg'$  at night to help with itching AS NEEDED.   3. 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 - Continue with allergy shots at the same schedule.  - We are going to get some immune labs as well (we last checked your immune system in 2020, I believe).   4. Follow up in one month.    Subjective:   [name] is a 61 y.o. female presenting today for follow up of  Chief Complaint  Patient presents with   Asthma    [name] has a history of the following: Patient Active Problem List   Diagnosis Date Noted   Acute on chronic respiratory failure with hypoxemia ([place]) 10/07/2022   Abnormal TSH 09/13/2022   Chronic sinusitis 08/14/2022   PND (paroxysmal nocturnal dyspnea) 06/07/2022   Vitamin D deficiency 05/20/2022   Eczema 05/18/2022   Insomnia 05/18/2022   Allergic conjunctivitis 05/18/2022   Chronic respiratory failure with hypoxia and hypercapnia ([place]) 04/30/2022   Bilateral hearing loss 04/14/2022   Moderate persistent asthma without complication 37/16/9678   Low back pain 10/11/2021   Abnormal CXR 07/17/2021   Aortic atherosclerosis ([place]) 07/05/2021   URI (upper respiratory infection) 07/05/2021   Arthrodesis status 06/20/2021   Drug allergy 05/14/2021   Acute cough 05/14/2021   Bilateral leg weakness 04/17/2021   Status post cervical spinal fusion 12/27/2020   History of stroke 12/11/2020   Medication management 10/21/2020   Body mass  index (BMI) 26.0-26.9, adult 08/23/2020   Healthcare maintenance 08/11/2020   Cervical spondylosis with myelopathy and radiculopathy 07/28/2020   Preop exam for internal medicine 06/19/2020   Hypotension due to medication 06/11/2020   Status asthmaticus 06/09/2020   Essential (primary) hypertension 05/17/2020   Other  spondylosis with myelopathy, cervical region 02/16/2020   Cervical spondylosis 02/02/2020   Radiculopathy, cervical region 02/02/2020   Spondylolysis, cervical region 02/02/2020   Neck pain 02/02/2020   Leg cramping 08/12/2019   Laryngopharyngeal reflux (LPR) 05/26/2019   Sinus pressure 05/26/2019   Hypophosphatemia 01/07/2019   Vertigo 09/23/2018   Dysfunction of left eustachian tube 09/23/2018   Gait disorder 04/21/2018   Leukocytosis 04/15/2018   Nausea & vomiting 04/15/2018   Seizure disorder ([place])    Prolapsed cervical intervertebral disc 03/11/2018   Cervical radiculopathy 02/18/2018   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   Syncope 09/04/2017   Constipation 09/04/2017   Nonintractable headache 09/04/2017   Leg swelling 07/26/2017   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   Hyperlipidemia 04/20/2017   Pedal edema 04/19/2017   Bipolar I disorder ([place]) 09/19/2016   Facial pain 07/12/2016   Rash 06/14/2016   Migraine 01/25/2016   Idiopathic urticaria/pruritus 01/23/2016   Allergic rhinitis 01/23/2016   Oral candidiasis 01/23/2016   Greater trochanteric bursitis of both hips 09/08/2015   Chest pain 06/22/2015   Itching 06/22/2015   Bilateral knee pain 06/22/2015   Bilateral hip pain 06/22/2015   Anemia, iron deficiency 12/22/2014   Fatigue 12/21/2014   Intertrigo 08/07/2014   Dyspareunia 07/12/2014   Menopausal syndrome (hot flushes) 07/12/2014   History of tobacco abuse 07/12/2014   Dizziness 06/22/2014   Weakness 06/22/2014   Fibromyalgia 05/14/2014   Encounter for well adult exam with abnormal findings 04/22/2014   Recurrent boils 04/22/2014   Recurrent falls 04/22/2014   Peripheral vascular disease ([place])    Depression    Anxiety    Gastroesophageal reflux disease    Prediabetes 02/23/2014   Screening mammogram for high-risk patient 02/23/2014   Back pain 07/22/2013   COPD/asthma 08/24/2009   Headache(784.0) 08/24/2009   Asthma-COPD overlap syndrome  05/12/2009    History obtained from: chart review and patient.  [name] is a 61 y.o. female presenting for a follow up visit. She was last seen in September 2023.  At that time, [name] one of our nurse practitioners added Alvesco 160 mcg 2 puffs once a day and continue with montelukast and Breztri 2 puffs twice daily.  She was continued on DuoNeb every 4-6 hours as needed and Dupixent every 2 weeks.  She was sent to the emergency room because of chest pain.  For her allergic rhinitis, she was continued on Xyzal as well as Flonase.  For her reflux, she was continued on famotidine.  It was recommended that she get a Pneumovax and follow-up blood work in 4 weeks.  In the interim, she went to the emergency room at the end of October for evaluation of respiratory distress.  She received albuterol as well as magnesium sulfate.  She was also given Rocephin for empiric coverage of community-acquired pneumonia.  An EKG showed normal sinus rhythm.  Labs showed a mild leukocytosis and a stable hemoglobin of 10.  Metabolic panel was unremarkable.  Initial troponin was 2.  She had already received IV Solu-Medrol in route as well as 2 DuoNebs.  She was transitioned to BiPAP in the emergency room.  She had a low-grade fever.  She was evaluated by Dr. Silas Flood, who is  working in the ICU.  She was admitted to the hospitalist service, although I do not see an actual admission note. She was apparently discharged the following day.   She is unsure of what is triggering her symptoms. She has had more this   Asthma/Respiratory Symptom History: She has been doing the [employer]. This does not seem to be doing the trick.  She felt that it worked really well at 1 point in time, but she is not feeling that protection anymore.  She does not use a spacer with her Breztri. It would work very well for a long period of time. Suddenly it just does not seem to be working. She does have a nebulizer at home. She has been using that a lot more  than usual.  She is good about using her BiPAP, which was set up by Dr. Lamonte Sakai. She is very compliant with this.   She does know that she needs to move because there is so much smoke where she lives. It is smoky everywhere. She is the only one in her complex that does not smoke.   She remains on her Dupixent and her allergy shots. She is compliant.   Allergic Rhinitis Symptom History: She remains on her allergy shots.  She feels that these are very helpful.  She has not had any large local reactions to these. Overall, she has done remarkably well with this.   [name] is on allergen immunotherapy. She receives one injection. Immunotherapy script #1 contains dust mites. She currently receives 0.84m of the RED vial (1/100). She started shots September of 2021 and reached maintenance in January of 2022.  Otherwise, there have been no changes to her past medical history, surgical history, family history, or social history.    Review of Systems  Constitutional: Negative.  Negative for chills, fever, malaise/fatigue and weight loss.  HENT: Negative.  Negative for congestion, ear discharge and ear pain.   Eyes:  Negative for pain, discharge and redness.  Respiratory:  Positive for cough and wheezing. Negative for sputum production and shortness of breath.   Cardiovascular: Negative.  Negative for chest pain and palpitations.  Gastrointestinal:  Negative for abdominal pain, constipation, diarrhea, heartburn, nausea and vomiting.  Skin: Negative.  Negative for itching and rash.  Neurological:  Negative for dizziness and headaches.  Endo/Heme/Allergies:  Positive for environmental allergies. Does not bruise/bleed easily.       Objective:   Blood pressure 110/68, pulse 95, temperature 98 F (36.7 C), temperature source Temporal, resp. rate 18, SpO2 96 %. There is no height or weight on file to calculate BMI.    Physical Exam Vitals reviewed.  Constitutional:      Appearance: She is  well-developed.     Comments: Very pleasant. Cooperative with the exam.   HENT:     Head: Normocephalic and atraumatic.     Right Ear: Tympanic membrane, ear canal and external ear normal.     Left Ear: Tympanic membrane, ear canal and external ear normal.     Nose: No nasal deformity, septal deviation, mucosal edema or rhinorrhea.     Right Turbinates: Enlarged, swollen and pale.     Left Turbinates: Enlarged, swollen and pale.     Right Sinus: No maxillary sinus tenderness or frontal sinus tenderness.     Left Sinus: No maxillary sinus tenderness or frontal sinus tenderness.     Comments: No nasal polyps noted.     Mouth/Throat:     Mouth: Mucous membranes are  not pale and not dry.     Pharynx: Uvula midline.     Comments: Cobblestoning present in the posterior oropharynx.  Eyes:     General: Lids are normal. Allergic shiner present.        Right eye: No discharge.        Left eye: No discharge.     Conjunctiva/sclera: Conjunctivae normal.     Right eye: Right conjunctiva is not injected. No chemosis.    Left eye: Left conjunctiva is not injected. No chemosis.    Pupils: Pupils are equal, round, and reactive to light.  Cardiovascular:     Rate and Rhythm: Normal rate and regular rhythm.     Heart sounds: Normal heart sounds.  Pulmonary:     Effort: Pulmonary effort is normal. No tachypnea, accessory muscle usage or respiratory distress.     Breath sounds: Normal breath sounds. No wheezing, rhonchi or rales.     Comments: Exquisitely tender with some guarding when I listen to her anterior right superior lung field. There is decreased air movement in that area as well.  Chest:     Chest wall: No tenderness.  Lymphadenopathy:     Cervical: No cervical adenopathy.  Skin:    General: Skin is warm.     Capillary Refill: Capillary refill takes less than 2 seconds.     Coloration: Skin is not pale.     Findings: No abrasion, erythema, petechiae or rash. Rash is not papular,  urticarial or vesicular.     Comments: There are some urticarial lesions noted on the face. Otherwise the remainder of the skin looks fairly clear.   Neurological:     Mental Status: She is alert.  Psychiatric:        Behavior: Behavior is cooperative.      Diagnostic studies:    Spirometry: results abnormal (FEV1: 0.79/39%, FVC: 1.36/53%, FEV1/FVC: 48%).    Spirometry consistent with mixed obstructive and restrictive disease.Overall this is relatively unchanged from the last time that we did this.   Allergy Studies: labs sent today       Salvatore Marvel, MD  [facility]

## 2022-10-19 ENCOUNTER — Encounter: Payer: Self-pay | Admitting: Internal Medicine

## 2022-10-19 ENCOUNTER — Telehealth: Payer: Self-pay | Admitting: Internal Medicine

## 2022-10-19 ENCOUNTER — Encounter: Payer: Self-pay | Admitting: Allergy & Immunology

## 2022-10-19 MED ORDER — FLUTICASONE PROPIONATE 50 MCG/ACT NA SUSP: 1.0000 | Freq: Two times a day (BID) | NASAL | 5 refills | Status: DC | PRN

## 2022-10-19 MED ORDER — FAMOTIDINE 20 MG PO TABS: 20.0000 mg | ORAL_TABLET | Freq: Every day | ORAL | 1 refills | Status: DC

## 2022-10-19 NOTE — Assessment & Plan Note (Signed)
Last vitamin D Lab Results  Component Value Date   VD25OH 29.50 (L) 09/13/2022   Low, reminded to start oral replacement

## 2022-10-19 NOTE — Telephone Encounter (Signed)
PA has been DENIED due to:  coverage for the requested drug(s) has been approved under Medicare Part B. [employer] follows Medicare rules. The Medicare rule in Chapter 6 of the Prescription Drug Manual says that drugs covered under the Part B benefit cannot be covered under Part D. Your pharmacy tells [place] where you live when they submit pharmacy claims. Your pharmacy has indicated you are getting this medication at home. The Medicare Benefit Manual (Chapter 15, Section 110.3) says Medicare Part B pays for drugs that require administration by the use of a piece of covered durable medical equipment (DME) such as a nebulizer. [employer] has approved coverage for your drug under your Part B benefit for/through 12/17/2023. If you think Medicare Part D should cover this drug for you, you may appeal.

## 2022-10-19 NOTE — Telephone Encounter (Signed)
Patient is requesting a call back about a letter that Dr Jenny Reichmann was supposed to write for her a month ago. Call back is [phone].

## 2022-10-19 NOTE — Assessment & Plan Note (Signed)
Overall stable, cont current med tx as per allergy today

## 2022-10-19 NOTE — Telephone Encounter (Deleted)
Can someone call to get more details?

## 2022-10-19 NOTE — Assessment & Plan Note (Signed)
Lab Results  Component Value Date   HGBA1C 6.5 09/13/2022   Stable, pt to continue current medical treatment - diet, wt control, excercise

## 2022-10-19 NOTE — Assessment & Plan Note (Signed)
Stable overall, cont pepcid 20 qd prn

## 2022-10-22 ENCOUNTER — Telehealth (HOSPITAL_COMMUNITY): Payer: Self-pay
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
## 2022-10-23 ENCOUNTER — Ambulatory Visit (INDEPENDENT_AMBULATORY_CARE_PROVIDER_SITE_OTHER): Payer: Medicare HMO | Admitting: Emergency Medicine

## 2022-10-23 ENCOUNTER — Encounter: Payer: Self-pay | Admitting: Emergency Medicine

## 2022-10-23 VITALS — BP 142/86 | HR 94 | Temp 98.3°F | Ht 63.0 in | Wt 182.2 lb

## 2022-10-23 DIAGNOSIS — J4489 Other specified chronic obstructive pulmonary disease: Secondary | ICD-10-CM

## 2022-10-23 NOTE — Patient Instructions (Addendum)
Stay on the nebulized Perforomist, Pulmicort, Yupelri as recently prescribed by Dr. Ernst Bowler. Continue Dupixent every 14 days Continue Flonase, Xyzal, Singulair Continue Pepcid and Protonix as you have been taking them We will arrange for pulmonary function testing to further evaluate your overall shortness of breath.  We will make these available also to Dr. Ernst Bowler and try to coordinate plans. Follow with Dr. Lamonte Sakai next available after your PFT.

## 2022-10-23 NOTE — Assessment & Plan Note (Signed)
She has persistent exertional dyspnea, decreased functional capacity but this is not always correlating with wheezing, overt asthmatic symptoms.  Recently changed to nebulized regimen by Dr. Ernst Bowler, not clear yet whether she has benefited from the change.  She is still evaluating this.  She needs repeat pulmonary function testing and we will try to arrange, then correlate plans with asthma/allergy.  We will continue to work to control potential exacerbating factors.  Stay on the nebulized Perforomist, Pulmicort, Yupelri as recently prescribed by Dr. Ernst Bowler. Continue Dupixent every 14 days Continue Flonase, Xyzal, Singulair Continue Pepcid and Protonix as you have been taking them We will arrange for pulmonary function testing to further evaluate your overall shortness of breath.  We will make these available also to Dr. Ernst Bowler and try to coordinate plans. Follow with Dr. Lamonte Sakai next available after your PFT.

## 2022-10-23 NOTE — Progress Notes (Signed)
Subjective:    Patient ID: [name], female    DOB: [birth year], 61 y.o.   MRN: [id number]  COPD She complains of cough, shortness of breath and wheezing. Associated symptoms include chest pain. Pertinent negatives include no fever, postnasal drip or rhinorrhea. Her past medical history is significant for COPD.    Hosp F/u Visit 06/26/22 --[name] is 61 and has a history of tobacco use, asthmatic COPD, seizures versus pseudoseizures, allergic rhinitis.  Based on exam there may be a component of upper airway irritation syndrome contributing to her respiratory limitation as well.  Has been managed on Dupixent, chronic prednisone - off currently.  Also Xyzal, Singulair.  She has been admitted twice since last time I saw her in May, both times for acute on chronic hypercapnic respiratory failure for which she required BiPAP support.  She was able to be qualified for home BiPAP with a PCO2 57 done on 06/13/2022. She reports that she is feeling better although has low energy. She has exertional SOB. She continues to have labile voice quality with hoarseness. Loses her voice intermittently. No albuterol use since she got out of the hospital. Currently on pred '15mg'$  w plan to go to zero. She is getting used to BiPAP, believes that it has helped her breathing. She is on breztri. On flonase, xyzal, singulair. She is on ppi qd, has breakthrough reflux on this.   Acute OV 10/23/22 --61 year old woman with asthmatic COPD, frequent exacerbations that often have an upper airway/VCD component.  She is on therapy for chronic GERD, chronic rhinitis.  She also has a history of chronic hypercapnic respiratory failure and has home BiPAP. Currently managed on Flonase, Xyzal, Singulair, Dupixent every 14 days She was just taken off Breztri by Dr Lewanda Rife, started on perforomist, pulmicort yulpelri nebs.  She continues to have exertional SOB, but no cough or wheeze currently    Review of Systems   Constitutional:  Positive for fatigue. Negative for chills and fever.  HENT:  Negative for postnasal drip, rhinorrhea and sinus pressure.   Respiratory:  Positive for cough, shortness of breath and wheezing.   Cardiovascular:  Positive for chest pain. Negative for palpitations and leg swelling.       Objective:   Physical Exam Vitals:   10/23/22 1119  BP: (!) 142/86  Pulse: 94  Temp: 98.3 F (36.8 C)  TempSrc: Oral  SpO2: 95%  Weight: 182 lb 3.2 oz (82.6 kg)  Height: '5\' 3"'$  (1.6 m)   Gen: Pleasant, well-nourished, in no distress,  normal affect  ENT: No lesions,  mouth clear,  oropharynx clear, no postnasal drip  Neck: No JVD, no stridor  Lungs: No use of accessory muscles, distant but no wheezing.  Cardiovascular: RRR, heart sounds normal, no murmur or gallops, no peripheral edema  Musculoskeletal: No deformities, no cyanosis or clubbing  Neuro: alert, non focal  Skin: Warm, no lesions or rashes      Assessment & Plan:   Asthma-COPD overlap syndrome She has persistent exertional dyspnea, decreased functional capacity but this is not always correlating with wheezing, overt asthmatic symptoms.  Recently changed to nebulized regimen by Dr. Ernst Bowler, not clear yet whether she has benefited from the change.  She is still evaluating this.  She needs repeat pulmonary function testing and we will try to arrange, then correlate plans with asthma/allergy.  We will continue to work to control potential exacerbating factors.  Stay on the nebulized Perforomist, Pulmicort, Yupelri as recently prescribed by Dr.  Gallagher. Continue Dupixent every 14 days Continue Flonase, Xyzal, Singulair Continue Pepcid and Protonix as you have been taking them We will arrange for pulmonary function testing to further evaluate your overall shortness of breath.  We will make these available also to Dr. Ernst Bowler and try to coordinate plans. Follow with Dr. Lamonte Sakai next available after your  PFT.   Baltazar Apo, MD, PhD 10/23/2022, 11:43 AM  [facility] [phone] or if no answer [phone]

## 2022-10-24 NOTE — Telephone Encounter (Signed)
Spoke with patient who states that she no longer needs the letter but will send [place] a form to complete instead. Patient will send the form as soon as she receives it.

## 2022-10-25 ENCOUNTER — Encounter: Payer: Self-pay | Admitting: Family Medicine

## 2022-10-25 ENCOUNTER — Other Ambulatory Visit: Payer: Self-pay

## 2022-10-25 ENCOUNTER — Ambulatory Visit: Payer: Self-pay

## 2022-10-25 ENCOUNTER — Ambulatory Visit (INDEPENDENT_AMBULATORY_CARE_PROVIDER_SITE_OTHER): Payer: Medicare HMO | Admitting: Family Medicine

## 2022-10-25 VITALS — BP 138/76 | HR 90 | Temp 98.2°F

## 2022-10-25 DIAGNOSIS — K219 Gastro-esophageal reflux disease without esophagitis: Secondary | ICD-10-CM

## 2022-10-25 DIAGNOSIS — R21 Rash and other nonspecific skin eruption: Secondary | ICD-10-CM | POA: Diagnosis not present

## 2022-10-25 DIAGNOSIS — J4550 Severe persistent asthma, uncomplicated: Secondary | ICD-10-CM | POA: Diagnosis not present

## 2022-10-25 DIAGNOSIS — J309 Allergic rhinitis, unspecified: Secondary | ICD-10-CM

## 2022-10-25 MED ORDER — TRIAMCINOLONE ACETONIDE 0.1 % EX OINT: 1.0000 | TOPICAL_OINTMENT | Freq: Two times a day (BID) | CUTANEOUS | 1 refills | Status: DC

## 2022-10-25 NOTE — Progress Notes (Signed)
[facility] [id number] Dept: [phone]  FOLLOW UP NOTE  Patient ID: [name], female    DOB: 1961/07/02  Age: 61 y.o. MRN: [id number] Date of Office Visit: 10/25/2022  Assessment  Chief Complaint: Rash  HPI [name] is a 60 year old female who presents to the clinic for an urgent visit.  She was last seen in this clinic on 10/18/2022 by Dr. Ernst Bowler for evaluation of asthma COPD overlap syndrome, recurrent hospital visits for breathing issues with hypercapnia and hypoxemia thought to be secondary to OSA, allergic rhinitis on allergen immunotherapy, chronic urticaria, recurrent infections, and complicated medical history including disability and bipolar 1 depression.  At today's visit, she reports that when she woke up yesterday morning she began to experience pruritus around her neckline.  She denies new foods, medications, personal care products, or insect stings.  She reports that sometimes when she wears fake jewelry she experiences contact dermatitis.  She reports that she did not wear any jewelry or any clothing with metal buttons yesterday or today.  Her asthma is reported as poorly controlled with symptoms including frequent dyspnea especially with activity, occasional wheeze, and occasional cough.  She continues Breztri 2 puffs twice a day with a spacer, montelukast 10 mg once a day, and uses albuterol frequently.  She is in the process of switching to nebulized medications, however, she reports that she only has Yupelri at this point.  She continues Dupixent injections once every 2 weeks with no large or local reactions.  She reports a moderate decrease in her symptoms of asthma while continuing on Dupixent injections.  She is scheduled for full PFTs with Dr. Lamonte Sakai, pulmonology.  Allergic rhinitis is reported as moderately well controlled with occasional nasal symptoms for which she continues Xyzal 5 mg once a day and uses Flonase and nasal  saline rinses daily.  She continues allergen immunotherapy directed toward dust mite with no adverse reactions.  She reports they moderate to decrease in her symptoms of allergic rhinitis while continuing on allergen immunotherapy.  Reflux is reported as well controlled with famotidine and pantoprazole.  Her current medications are listed in the chart.   Drug Allergies:  Allergies  Allergen Reactions   Belsomra [Suvorexant] Other (See Comments)    hallucinations   Sulfa Antibiotics Nausea And Vomiting   Venofer [Iron Sucrose] Hives    Physical Exam: BP 138/76   Pulse 90   Temp 98.2 F (36.8 C)   SpO2 93%    Physical Exam Vitals reviewed.  Constitutional:      Appearance: Normal appearance.  HENT:     Head: Normocephalic and atraumatic.     [name] Ear: Tympanic membrane normal.     Left Ear: Tympanic membrane normal.     Nose:     Comments: Bilateral nares edematous and pale with clear nasal drainage noted.  Pharynx slightly erythematous.  Ears normal.  Eyes normal. Eyes:     Conjunctiva/sclera: Conjunctivae normal.  Cardiovascular:     Rate and Rhythm: Normal rate and regular rhythm.     Heart sounds: Normal heart sounds. No murmur heard. Pulmonary:     Effort: Pulmonary effort is normal.     Breath sounds: Normal breath sounds.     Comments: Lungs clear to auscultation Musculoskeletal:     Cervical back: Normal range of motion and neck supple.  Skin:    Comments: Dry, erythematous area noted at the base of her neck.  No raised areas noted.  No open areas or drainage noted.  Neurological:     Mental Status: She is alert and oriented to person, place, and time.  Psychiatric:        Mood and Affect: Mood normal.        Behavior: Behavior normal.        Thought Content: Thought content normal.        Judgment: Judgment normal.     Assessment and Plan: 1. Not well controlled severe persistent asthma   2. Gastroesophageal reflux disease, unspecified whether esophagitis  present   3. Rash and nonspecific skin eruption   4. Laryngopharyngeal reflux (LPR)     Meds ordered this encounter  Medications   triamcinolone ointment (KENALOG) 0.1 %    Sig: Apply 1 Application topically 2 (two) times daily.    Dispense:  30 g    Refill:  1    Patient Instructions  Asthma Continue montelukast 10 mg once a day to prevent cough or wheeze.  Continue Breztri 2 puffs twice a day until you get all three of the medications for your nebulizer. Then stop Breztri and bgin the nebulized medications Continue Yupelri via nebulizer once a day to prevent cough or wheeze Continue budesonide via nebulizer twice a day to prevent cough or wheeze Continue Perforomist by nebulizer twice a day to prevent cough or wheeze Continue Dupixent injections once every 2 weeks for asthma control Continue albuterol 2 puffs once every 4 hours as needed for cough or wheeze You may use albuterol 2 puffs 5 to 15 minutes before activity to decrease cough or wheeze  Rash Begin triamcinolone 0.1% cream up to twice a day.  If your symptoms re-occur, begin a journal of events that occurred for up to 6 hours before your symptoms began including foods and beverages consumed, soaps or perfumes you had contact with, and medications.   Allergic rhinitis Continue allergen avoidance measures directed toward dust mite as listed below Continue Xyzal 5 mg once a day as needed for runny nose or itch Continue Flonase 2 sprays in each nostril once a day as needed for stuffy nose Consider saline nasal rinses as needed for nasal symptoms. Use this before any medicated nasal sprays for best result Continue allergen immunotherapy directed toward dust mite and have access to an epinephrine autoinjector set per protocol  Reflux Continue dietary and lifestyle modifications as listed below Continue famotidine 20 mg once or twice a day for reflux Continue pantoprazole 40 mg as previously prescribed  Call the clinic if  this treatment plan is not working well for you.  Follow up in 1 motnh or sooner if needed.   Return in about 4 weeks (around 11/22/2022), or if symptoms worsen or fail to improve.    Thank you for the opportunity to care for this patient.  Please do not hesitate to contact me with questions.  Gareth Morgan, FNP [facility]

## 2022-10-25 NOTE — Patient Instructions (Addendum)
Asthma Continue montelukast 10 mg once a day to prevent cough or wheeze.  Continue Breztri 2 puffs twice a day until you get all three of the medications for your nebulizer. Then stop Breztri and bgin the nebulized medications Continue Yupelri via nebulizer once a day to prevent cough or wheeze Continue budesonide via nebulizer twice a day to prevent cough or wheeze Continue Perforomist by nebulizer twice a day to prevent cough or wheeze Continue Dupixent injections once every 2 weeks for asthma control Continue albuterol 2 puffs once every 4 hours as needed for cough or wheeze You may use albuterol 2 puffs 5 to 15 minutes before activity to decrease cough or wheeze  Rash Begin triamcinolone 0.1% cream up to twice a day.  If your symptoms re-occur, begin a journal of events that occurred for up to 6 hours before your symptoms began including foods and beverages consumed, soaps or perfumes you had contact with, and medications.   Allergic rhinitis Continue allergen avoidance measures directed toward dust mite as listed below Continue Xyzal 5 mg once a day as needed for runny nose or itch Continue Flonase 2 sprays in each nostril once a day as needed for stuffy nose Consider saline nasal rinses as needed for nasal symptoms. Use this before any medicated nasal sprays for best result Continue allergen immunotherapy directed toward dust mite and have access to an epinephrine autoinjector set per protocol  Reflux Continue dietary and lifestyle modifications as listed below Continue famotidine 20 mg once or twice a day for reflux Continue pantoprazole 40 mg as previously prescribed  Call the clinic if this treatment plan is not working well for you.  Follow up in 1 motnh or sooner if needed.   Control of Dust Mite Allergen Dust mites play a major role in allergic asthma and rhinitis. They occur in environments with high humidity wherever human skin is found. Dust mites absorb humidity from  the atmosphere (ie, they do not drink) and feed on organic matter (including shed human and animal skin). Dust mites are a microscopic type of insect that you cannot see with the naked eye. High levels of dust mites have been detected from mattresses, pillows, carpets, upholstered furniture, bed covers, clothes, soft toys and any woven material. The principal allergen of the dust mite is found in its feces. A gram of dust may contain 1,000 mites and 250,000 fecal particles. Mite antigen is easily measured in the air during house cleaning activities. Dust mites do not bite and do not cause harm to humans, other than by triggering allergies/asthma.  Ways to decrease your exposure to dust mites in your home:  1. Encase mattresses, box springs and pillows with a mite-impermeable barrier or cover  2. Wash sheets, blankets and drapes weekly in hot water (130 F) with detergent and dry them in a dryer on the hot setting.  3. Have the room cleaned frequently with a vacuum cleaner and a damp dust-mop. For carpeting or rugs, vacuuming with a vacuum cleaner equipped with a high-efficiency particulate air (HEPA) filter. The dust mite allergic individual should not be in a room which is being cleaned and should wait 1 hour after cleaning before going into the room.  4. Do not sleep on upholstered furniture (eg, couches).  5. If possible removing carpeting, upholstered furniture and drapery from the home is ideal. Horizontal blinds should be eliminated in the rooms where the person spends the most time (bedroom, study, television room). Washable vinyl, roller-type shades are  optimal.  6. Remove all non-washable stuffed toys from the bedroom. Wash stuffed toys weekly like sheets and blankets above.  7. Reduce indoor humidity to less than 50%. Inexpensive humidity monitors can be purchased at most hardware stores. Do not use a humidifier as can make the problem worse and are not recommended.

## 2022-10-26 ENCOUNTER — Encounter: Payer: Self-pay | Admitting: Family Medicine

## 2022-10-27 LAB — CBC WITH DIFFERENTIAL/PLATELET
Basophils Absolute: 0 10*3/uL (ref 0.0–0.2)
Basos: 0 %
EOS (ABSOLUTE): 0.1 10*3/uL (ref 0.0–0.4)
Eos: 1 %
Hematocrit: 34.4 % (ref 34.0–46.6)
Hemoglobin: 11.1 g/dL (ref 11.1–15.9)
Immature Grans (Abs): 0.2 10*3/uL — ABNORMAL HIGH (ref 0.0–0.1)
Immature Granulocytes: 1 %
Lymphocytes Absolute: 2.3 10*3/uL (ref 0.7–3.1)
Lymphs: 22 %
MCH: 26.6 pg (ref 26.6–33.0)
MCHC: 32.3 g/dL (ref 31.5–35.7)
MCV: 83 fL (ref 79–97)
Monocytes Absolute: 0.9 10*3/uL (ref 0.1–0.9)
Monocytes: 9 %
Neutrophils Absolute: 6.9 10*3/uL (ref 1.4–7.0)
Neutrophils: 67 %
Platelets: 431 10*3/uL (ref 150–450)
RBC: 4.17 x10E6/uL (ref 3.77–5.28)
RDW: 18.5 % — ABNORMAL HIGH (ref 11.7–15.4)
WBC: 10.4 10*3/uL (ref 3.4–10.8)

## 2022-10-27 LAB — STREP PNEUMONIAE 23 SEROTYPES IGG
Pneumo Ab Type 1*: 1 ug/mL — ABNORMAL LOW (ref >1.3)
Pneumo Ab Type 12 (12F)*: 0.9 ug/mL — ABNORMAL LOW (ref >1.3)
Pneumo Ab Type 14*: 0.8 ug/mL — ABNORMAL LOW (ref >1.3)
Pneumo Ab Type 17 (17F)*: 0.2 ug/mL — ABNORMAL LOW (ref >1.3)
Pneumo Ab Type 19 (19F)*: 2.8 ug/mL (ref >1.3)
Pneumo Ab Type 2*: 1.5 ug/mL (ref >1.3)
Pneumo Ab Type 20*: 1.6 ug/mL (ref >1.3)
Pneumo Ab Type 22 (22F)*: 3.4 ug/mL (ref >1.3)
Pneumo Ab Type 23 (23F)*: 0.4 ug/mL — ABNORMAL LOW (ref >1.3)
Pneumo Ab Type 26 (6B)*: 0.1 ug/mL — ABNORMAL LOW (ref >1.3)
Pneumo Ab Type 3*: 0.5 ug/mL — ABNORMAL LOW (ref >1.3)
Pneumo Ab Type 34 (10A)*: 1.5 ug/mL (ref >1.3)
Pneumo Ab Type 4*: 0.3 ug/mL — ABNORMAL LOW (ref >1.3)
Pneumo Ab Type 43 (11A)*: 0.2 ug/mL — ABNORMAL LOW (ref >1.3)
Pneumo Ab Type 5*: 1.3 ug/mL — ABNORMAL LOW (ref >1.3)
Pneumo Ab Type 51 (7F)*: 6.3 ug/mL (ref >1.3)
Pneumo Ab Type 54 (15B)*: 1.4 ug/mL (ref >1.3)
Pneumo Ab Type 56 (18C)*: 0.2 ug/mL — ABNORMAL LOW (ref >1.3)
Pneumo Ab Type 57 (19A)*: 13.1 ug/mL (ref >1.3)
Pneumo Ab Type 68 (9V)*: 1 ug/mL — ABNORMAL LOW (ref >1.3)
Pneumo Ab Type 70 (33F)*: 1.1 ug/mL — ABNORMAL LOW (ref >1.3)
Pneumo Ab Type 8*: 0.9 ug/mL — ABNORMAL LOW (ref >1.3)
Pneumo Ab Type 9 (9N)*: 0.3 ug/mL — ABNORMAL LOW (ref >1.3)

## 2022-10-27 LAB — ASPERGILLUS PRECIPITINS
A.Fumigatus #1 Abs: NEGATIVE
Aspergillus Flavus Antibodies: NEGATIVE
Aspergillus Niger Antibodies: NEGATIVE
Aspergillus glaucus IgG: NEGATIVE
Aspergillus nidulans IgG: NEGATIVE
Aspergillus terreus IgG: NEGATIVE

## 2022-10-27 LAB — COMPLEMENT, TOTAL: Compl, Total (CH50): >60 U/mL (ref >41)

## 2022-10-27 LAB — DIPHTHERIA / TETANUS ANTIBODY PANEL
Diphtheria Ab: <0.1 IU/mL — ABNORMAL LOW (ref <0.10)
Tetanus Ab, IgG: 1.05 IU/mL (ref <0.10)

## 2022-10-27 LAB — IGG, IGA, IGM
IgA/Immunoglobulin A, Serum: 105 mg/dL (ref 87–352)
IgG (Immunoglobin G), Serum: 709 mg/dL (ref 586–1602)
IgM (Immunoglobulin M), Srm: 58 mg/dL (ref 26–217)

## 2022-10-27 LAB — ALPHA-1-ANTITRYPSIN: A-1 Antitrypsin: 168 mg/dL (ref 101–187)

## 2022-10-27 LAB — ANCA TITERS
Atypical pANCA: <1:20 {titer}
C-ANCA: <1:20 {titer}
P-ANCA: <1:20 {titer}

## 2022-10-27 LAB — IGE: IgE (Immunoglobulin E), Serum: 5 IU/mL — ABNORMAL LOW (ref 6–495)

## 2022-10-29 NOTE — Progress Notes (Signed)
Rash on her neck

## 2022-10-30 ENCOUNTER — Ambulatory Visit (INDEPENDENT_AMBULATORY_CARE_PROVIDER_SITE_OTHER): Payer: Medicare HMO

## 2022-10-30 DIAGNOSIS — J4550 Severe persistent asthma, uncomplicated: Secondary | ICD-10-CM | POA: Diagnosis not present

## 2022-10-30 NOTE — Telephone Encounter (Signed)
PT visits today with forms to be filled out! Forms are currently in Dr.John's mailbox in an envelope with PT's name on it.

## 2022-11-01 NOTE — Telephone Encounter (Signed)
Form completed and placed at front office, patient notified.

## 2022-11-01 NOTE — Telephone Encounter (Signed)
Patient came by office and has picked up forms.

## 2022-11-12 ENCOUNTER — Ambulatory Visit (INDEPENDENT_AMBULATORY_CARE_PROVIDER_SITE_OTHER): Payer: Medicare HMO

## 2022-11-12 DIAGNOSIS — J4550 Severe persistent asthma, uncomplicated: Secondary | ICD-10-CM

## 2022-11-15 ENCOUNTER — Ambulatory Visit (HOSPITAL_COMMUNITY): Payer: Medicare HMO | Admitting: Psychiatry

## 2022-11-20 ENCOUNTER — Ambulatory Visit (INDEPENDENT_AMBULATORY_CARE_PROVIDER_SITE_OTHER): Payer: Medicare HMO | Admitting: Internal Medicine

## 2022-11-20 VITALS — BP 130/76 | HR 89 | Temp 98.4°F | Ht 63.0 in | Wt 186.0 lb

## 2022-11-20 DIAGNOSIS — J441 Chronic obstructive pulmonary disease with (acute) exacerbation: Secondary | ICD-10-CM | POA: Diagnosis not present

## 2022-11-20 DIAGNOSIS — E559 Vitamin D deficiency, unspecified: Secondary | ICD-10-CM

## 2022-11-20 DIAGNOSIS — I10 Essential (primary) hypertension: Secondary | ICD-10-CM | POA: Diagnosis not present

## 2022-11-20 NOTE — Assessment & Plan Note (Signed)
Stable recently, pt encouraged to continue med compliance

## 2022-11-20 NOTE — Patient Instructions (Signed)
Please continue all other medications as before, though you should see Dr Ernst Bowler about the change of nebulizer if possible  Please remember to have your second Shingles shot as you mentioned  Please have the pharmacy call with any other refills you may need.  Please continue your efforts at being more active, low cholesterol diet, and weight control.  Please keep your appointments with your specialists as you may have planned  Please make an Appointment to return in 6 months, or sooner if needed

## 2022-11-20 NOTE — Assessment & Plan Note (Signed)
BP Readings from Last 3 Encounters:  11/20/22 130/76  10/25/22 138/76  10/23/22 (!) 142/86   Stable, pt to continue medical treatment avapro 75 mg qd, hct 12.5 mgqd

## 2022-11-20 NOTE — Progress Notes (Addendum)
Patient ID: [name], female   DOB: 1961/02/17, 61 y.o.   MRN: [id number]        Chief Complaint: follow up wheezing copd asthma, low vit d, htn       HPI:  [name] is a 60 y.o. female here overall doing ok, Pt denies chest pain, increased sob or doe, wheezing, orthopnea, PND, increased LE swelling, palpitations, dizziness or syncope. S/p shingles #1 today at the pharmacy.  Has stayed out of hospital in several months, good med compliance and f/u with pulmonary.  Did see allergist and difficutl to tolerate a nebulizer medication and plans to f/u with that provider.   Pt denies polydipsia, polyuria, or new focal neuro s/s.   Has PFTs scheduled for next wk.   Pt denies fever, wt loss, night sweats, loss of appetite, or other constitutional symptoms  No other new complaints .   Wt Readings from Last 3 Encounters:  11/20/22 186 lb (84.4 kg)  10/23/22 182 lb 3.2 oz (82.6 kg)  10/18/22 175 lb (79.4 kg)   BP Readings from Last 3 Encounters:  11/20/22 130/76  10/25/22 138/76  10/23/22 (!) 142/86         Past Medical History:  Diagnosis Date   Anemia, iron deficiency 12/22/2014   pt. 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      Bipolar disorder ([place])    COPD (chronic obstructive pulmonary disease) ([place])    Depression    Eczema 05/18/2022   Fibromyalgia 05/14/2014   GERD (gastroesophageal reflux disease)    History of kidney stones    Hyperlipidemia 04/20/2017   HYPERTENSION 05/12/2009   Qualifier: Diagnosis of  By: Lenn Cal Deborra Medina), [name]     Kidney stones    Peripheral vascular disease ([place])    Pneumonia    Prediabetes 02/23/2014   pt. denies   Seizure ([place])    Stroke ([place]) 11/2020   Urticaria    Past Surgical History:  Procedure Laterality  Date   ABDOMINAL HYSTERECTOMY     ANTERIOR CERVICAL DECOMP/DISCECTOMY FUSION N/A 07/28/2020   Procedure: ANTERIOR CERVICAL DECOMPRESSION/DISCECTOMY FUSION. INTERBODY PROTHESIS, PLATE/SCREWS CERVICAL THREE- CERVICAL FOUR, CERVICAL FOUR- CERVICAL FIVE;  Surgeon: Newman Pies, MD;  Location: [facility];  Service: Neurosurgery;  Laterality: N/A;   BACK SURGERY     COLONOSCOPY  12/20/2011   Procedure: COLONOSCOPY;  Surgeon: Landry Dyke, MD;  Location: [facility];  Service: Endoscopy;  Laterality: N/A;   COLONOSCOPY  03/05/2012   Procedure: COLONOSCOPY;  Surgeon: Landry Dyke, MD;  Location: [facility];  Service: Endoscopy;  Laterality: N/A;   DIAGNOSTIC LAPAROSCOPY     HEMORRHOID SURGERY     INCISE AND DRAIN ABCESS     KIDNEY STONE SURGERY     NECK SURGERY     x 2 Dr Orinda Kenner   SPINE SURGERY  2019   TOE SURGERY     TUBAL LIGATION      reports that she quit smoking about 3 years ago. Her smoking use included cigarettes. She has a 9.00 pack-year smoking history. She has never used smokeless tobacco. She reports that she does not currently use alcohol. She reports current drug use. Drug: Marijuana. family history includes Alcohol abuse in her brother and father; Anemia in her daughter,  sister, and sister; Anesthesia problems in her father; Asthma in her brother, mother, and sister; Brain cancer in her sister; Breast cancer (age of onset: 18) in her sister; COPD in her mother and sister; Colon cancer (age of onset: 57) in her father; Diabetes in her brother, father, mother, sister, sister, sister, and sister; Heart disease in her brother, brother, mother, and sister; Hypertension in her brother, daughter, sister, sister, sister, sister, and sister; Kidney disease in her father; Sleep apnea in her brother. Allergies  Allergen Reactions   Belsomra [Suvorexant] Other (See Comments)    hallucinations   Sulfa Antibiotics Nausea And Vomiting   Venofer [Iron Sucrose] Hives   Current Outpatient  Medications on File Prior to Visit  Medication Sig Dispense Refill   albuterol (PROVENTIL) (2.5 MG/3ML) 0.083% nebulizer solution Take 3 mLs (2.5 mg total) by nebulization every 6 (six) hours as needed for wheezing or shortness of breath. 120 mL 0   albuterol (VENTOLIN HFA) 108 (90 Base) MCG/ACT inhaler Inhale 2 puffs into the lungs every 6 (six) hours as needed for wheezing or shortness of breath. 18 g 1   atorvastatin (LIPITOR) 40 MG tablet Take 1 tablet (40 mg total) by mouth daily. TAKE 1 TABLET(40 MG) BY MOUTH DAILY Strength: 40 mg (Patient taking differently: Take 40 mg by mouth daily.) 90 tablet 3   azelastine (OPTIVAR) 0.05 % ophthalmic solution Place 1 drop into both eyes 2 (two) times daily. 6 mL 12   BELSOMRA 15 MG TABS Take 15 mg by mouth at bedtime as needed (sleep).     budesonide (PULMICORT) 0.5 MG/2ML nebulizer solution Pulmicort (budesonide) + Perforomist (formoterol) mixed into a nebulizer twice daily 120 mL 5   butalbital-acetaminophen-caffeine (FIORICET) 50-325-40 MG tablet 1 tab by mouth once daily as needed for HA, limit 14 per month 14 tablet 0   carbamazepine (TEGRETOL) 200 MG tablet Take 2 tablets (400 mg total) by mouth 2 (two) times daily. 120 tablet 12   cetirizine (ZYRTEC) 10 MG tablet Can take one tablet by mouth once daily if needed for runny nose. (Patient taking differently: Take 10 mg by mouth daily as needed for rhinitis.) 30 tablet 5   Cholecalciferol (VITAMIN D) 125 MCG (5000 UT) CAPS Take 5,000 Units by mouth daily.     cyclobenzaprine (FLEXERIL) 5 MG tablet Take 5 mg by mouth 3 (three) times daily as needed.     diclofenac Sodium (VOLTAREN) 1 % GEL Apply 4 g topically 4 (four) times daily. (Patient taking differently: Apply 4 g topically 4 (four) times daily as needed (pain).) 100 g 0   DUPIXENT 300 MG/2ML prefilled syringe Inject 300 mg into the skin every 28 (twenty-eight) days.     famotidine (PEPCID) 20 MG tablet Take 1 tablet (20 mg total) by mouth daily.  OTC 90 tablet 1   ferrous sulfate 325 (65 FE) MG EC tablet Take 1 tablet (325 mg total) by mouth daily with breakfast. 90 tablet 1   fluticasone (FLONASE) 50 MCG/ACT nasal spray Place 1 spray into both nostrils 2 (two) times daily as needed for allergies or rhinitis. 16 g 5   formoterol (PERFOROMIST) 20 MCG/2ML nebulizer solution Pulmicort (budesonide) + Perforomist (formoterol) mixed into a nebulizer twice daily 120 mL 5   gabapentin (NEURONTIN) 100 MG capsule Take 1 capsule (100 mg total) by mouth 3 (three) times daily. 90 capsule 3   hydrochlorothiazide (MICROZIDE) 12.5 MG capsule Take 1 capsule (12.5 mg total) by mouth daily. TAKE 1 CAPSULE(12.5 MG) BY  MOUTH DAILY Strength: 12.5 mg (Patient taking differently: Take 12.5 mg by mouth daily.) 90 capsule 3   hydrOXYzine (VISTARIL) 25 MG capsule Take 1 capsule (25 mg total) by mouth every 6 (six) hours as needed for anxiety. 25 capsule 4   irbesartan (AVAPRO) 75 MG tablet TAKE 1 TABLET(75 MG) BY MOUTH DAILY 90 tablet 1   montelukast (SINGULAIR) 10 MG tablet Take 1 tablet (10 mg total) by mouth at bedtime. 90 tablet 1   ondansetron (ZOFRAN ODT) 4 MG disintegrating tablet Take 1 tablet (4 mg total) by mouth every 8 (eight) hours as needed for nausea or vomiting. 20 tablet 0   pantoprazole (PROTONIX) 40 MG tablet TAKE 1 TABLET BY MOUTH EVERY DAY (Patient taking differently: Take 40 mg by mouth daily.) 90 tablet 1   potassium chloride (KLOR-CON M) 10 MEQ tablet Take 1 tablet (10 mEq total) by mouth daily. 90 tablet 3   Skin Protectants, Misc. (EUCERIN) cream Apply 1 Application topically daily as needed for dry skin.     tacrolimus (PROTOPIC) 0.03 % ointment Apply topically 2 (two) times daily. (Patient taking differently: Apply 1 Application topically 2 (two) times daily as needed (rash).) 100 g 0   triamcinolone ointment (KENALOG) 0.1 % Apply 1 Application topically 2 (two) times daily. 30 g 1   revefenacin (YUPELRI) 175 MCG/3ML nebulizer solution Take 3  mLs (175 mcg total) by nebulization daily. (Patient not taking: Reported on 11/20/2022) 90 mL 5   Current Facility-Administered Medications on File Prior to Visit  Medication Dose Route Frequency Provider Last Rate Last Admin   dupilumab (DUPIXENT) prefilled syringe 300 mg  300 mg Subcutaneous Q14 Days Valentina Shaggy, MD   300 mg at 11/12/22 0933        ROS:  All others reviewed and negative.  Objective        PE:  BP 130/76 (BP Location: Right Arm, Patient Position: Sitting, Cuff Size: Large)   Pulse 89   Temp 98.4 F (36.9 C) (Oral)   Ht '5\' 3"'$  (1.6 m)   Wt 186 lb (84.4 kg)   SpO2 96%   BMI 32.95 kg/m                 Constitutional: Pt appears in NAD               HENT: Head: NCAT.                Right Ear: External ear normal.                 Left Ear: External ear normal.                Eyes: . Pupils are equal, round, and reactive to light. Conjunctivae and EOM are normal               Nose: without d/c or deformity               Neck: Neck supple. Gross normal ROM               Cardiovascular: Normal rate and regular rhythm.                 Pulmonary/Chest: Effort normal and breath sounds without rales or wheezing.                Abd:  Soft, NT, ND, + BS, no organomegaly  Neurological: Pt is alert. At baseline orientation, motor grossly intact               Skin: Skin is warm. No rashes, no other new lesions, LE edema - none               Psychiatric: Pt behavior is normal without agitation   Micro: none  Cardiac tracings I have personally interpreted today:  none  Pertinent Radiological findings (summarize): none   Lab Results  Component Value Date   WBC 10.4 10/18/2022   HGB 11.1 10/18/2022   HCT 34.4 10/18/2022   PLT 431 10/18/2022   GLUCOSE 233 (H) 10/08/2022   CHOL 186 05/18/2022   TRIG 55.0 05/18/2022   HDL 96.00 05/18/2022   LDLCALC 79 05/18/2022   ALT 21 10/08/2022   AST 23 10/08/2022   NA 138 10/08/2022   K 4.0 10/08/2022   CL  101 10/08/2022   CREATININE 0.78 10/08/2022   BUN 11 10/08/2022   CO2 23 10/08/2022   TSH 1.16 09/13/2022   INR 0.9 02/06/2022   HGBA1C 6.5 09/13/2022   MICROALBUR 0.9 04/11/2021   Assessment/Plan:  [name] is a 61 y.o. Black or African American [2] female with  has a past medical history of Anemia, iron deficiency (12/22/2014), Anxiety, Arthritis, ASTHMA (05/12/2009), Bipolar disorder ([place]), COPD (chronic obstructive pulmonary disease) ([place]), Depression, Eczema (05/18/2022), Fibromyalgia (05/14/2014), GERD (gastroesophageal reflux disease), History of kidney stones, Hyperlipidemia (04/20/2017), HYPERTENSION (05/12/2009), Kidney stones, Peripheral vascular disease ([place]), Pneumonia, Prediabetes (02/23/2014), Seizure ([place]), Stroke ([place]) (11/2020), and Urticaria.  COPD/asthma Stable recently, pt encouraged to continue med compliance  Essential (primary) hypertension BP Readings from Last 3 Encounters:  11/20/22 130/76  10/25/22 138/76  10/23/22 (!) 142/86   Stable, pt to continue medical treatment avapro 75 mg qd, hct 12.5 mgqd   Vitamin D deficiency Last vitamin D Lab Results  Component Value Date   VD25OH 29.50 (L) 09/13/2022   Low, reminded to start oral replacement  Followup: Return in about 6 months (around 05/22/2023).  Cathlean Cower, MD 11/20/2022 8:30 PM [facility]

## 2022-11-20 NOTE — Assessment & Plan Note (Signed)
Last vitamin D Lab Results  Component Value Date   VD25OH 29.50 (L) 09/13/2022   Low, reminded to start oral replacement

## 2022-11-25 NOTE — Patient Instructions (Signed)
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
## 2022-11-26 ENCOUNTER — Telehealth: Payer: Self-pay | Admitting: *Deleted

## 2022-11-26 ENCOUNTER — Ambulatory Visit (INDEPENDENT_AMBULATORY_CARE_PROVIDER_SITE_OTHER): Payer: Medicare HMO | Admitting: *Deleted

## 2022-11-26 ENCOUNTER — Ambulatory Visit (INDEPENDENT_AMBULATORY_CARE_PROVIDER_SITE_OTHER): Payer: Medicare HMO

## 2022-11-26 DIAGNOSIS — J4550 Severe persistent asthma, uncomplicated: Secondary | ICD-10-CM | POA: Diagnosis not present

## 2022-11-26 DIAGNOSIS — Z0000 Encounter for general adult medical examination without abnormal findings: Secondary | ICD-10-CM

## 2022-11-26 DIAGNOSIS — Z7189 Other specified counseling: Secondary | ICD-10-CM

## 2022-11-26 NOTE — Progress Notes (Unsigned)
  Care Coordination  Outreach Note  11/26/2022 Name: [name] MRN: [id number] DOB: [birth year]   Care Coordination Outreach Attempts: An unsuccessful telephone outreach was attempted today to offer the patient information about available care coordination services as a benefit of their health plan.   Referral received   Follow Up Plan:  Additional outreach attempts will be made to offer the patient care coordination information and services.   Encounter Outcome:  No Answer  Julian Hy, [place] Direct Dial: [phone]

## 2022-11-27 NOTE — Progress Notes (Signed)
  Care Coordination   Note   11/27/2022 Name: Aniyia Rane MRN: [id number] DOB: Jun 12, 1961  Sherrell Puller Utke is a 61 y.o. year old female who sees Biagio Borg, MD for primary care. I reached out to [name] by phone today to offer care coordination services.  Ms. Pupo was given information about Care Coordination services today including:   The Care Coordination services include support from the care team which includes your Nurse Coordinator, Clinical Social Worker, or Pharmacist.  The Care Coordination team is here to help remove barriers to the health concerns and goals most important to you. Care Coordination services are voluntary, and the patient may decline or stop services at any time by request to their care team member.   Care Coordination Consent Status: Patient agreed to services and verbal consent obtained.   Follow up plan:  Telephone appointment with care coordination team member scheduled for:  11/29/2022  Encounter Outcome:  Pt. Scheduled from referral   Julian Hy, [place] Direct Dial: [phone]

## 2022-11-29 ENCOUNTER — Ambulatory Visit: Payer: Self-pay | Admitting: Licensed Clinical Social Worker

## 2022-11-29 ENCOUNTER — Ambulatory Visit (HOSPITAL_COMMUNITY): Payer: Medicare HMO | Admitting: Psychiatry

## 2022-11-29 NOTE — Patient Outreach (Signed)
  Care Coordination  Initial Visit Note   11/29/2022 Name: Kimmerly Lora MRN: [id number] DOB: 01-26-1961  Sherrell Puller Stefanick is a 62 y.o. year old female who sees Biagio Borg, MD for primary care. I spoke with  [name] by phone today.  What matters to the patients health and wellness today?  Managing her mental health by adding talk therapy. LCSW will assist with locating a therapist based on need and insurance     Goals Addressed             This Visit's Progress    Care Coordination Activities / mental health support       Care Coordination Interventions: Reviewed Care Coordination Services:  Made referral to RN Care Coordinator : COPD and Diabetes management Motivational Interviewing employed Solution-Focused Strategies employed:  Emotional Support Provided Problem [place] strategies reviewed Provided general psycho-education for mental health needs  Discussed referral options to connect for ongoing therapy: assess barriers, need.  Looking for therapy options for patient. Called [facility] to assist with rescheduling psychiatry appointment           SDOH assessments and interventions completed: completed during AWV 12/11  No    Care Coordination Interventions:  Yes, provided   Follow up plan: Follow up call scheduled for 12/13/22    Encounter Outcome:  Pt. Visit Completed   Casimer Lanius, [place] [phone]

## 2022-11-29 NOTE — Patient Instructions (Signed)
Visit Information  Thank you for taking time to visit with me today. Please don't hesitate to contact me if I can be of assistance to you.   Following are the goals we discussed today:   Goals Addressed             This Visit's Progress    Care Coordination Activities / mental health support       Care Coordination Interventions: Reviewed Care Coordination Services:  Made referral to RN Care Coordinator : COPD and Diabetes management Motivational Interviewing employed Solution-Focused Strategies employed:  Emotional Support Provided Problem [place] strategies reviewed Provided general psycho-education for mental health needs  Discussed referral options to connect for ongoing therapy: assess barriers, need.  Looking for therapy options for patient. Called [facility] to assist with rescheduling psychiatry appointment           Our next appointment is by telephone on 12/13/22 at 1:15  Please call the care guide team at [phone] if you need to cancel or reschedule your appointment.   If you are experiencing a Mental Health or [place] or need someone to talk to, please call the Suicide and Crisis Lifeline: 988 call the [place] National Suicide Prevention Lifeline: 843-737-7336 or TTY: (442) 350-4132 TTY 347-632-0258) to talk to a trained counselor call 1-800-273-TALK (toll free, 24 hour hotline) go to [facility] [address] [place] [phone])   Patient verbalizes understanding of instructions and care plan provided today and agrees to view in [place]. Active MyChart status and patient understanding of how to access instructions and care plan via MyChart confirmed with patient.     Casimer Lanius, [place] [phone]

## 2022-11-30 ENCOUNTER — Other Ambulatory Visit: Payer: Self-pay | Admitting: Obstetrics & Gynecology

## 2022-11-30 NOTE — Telephone Encounter (Signed)
Last annual exam 09/2021 Last mammogram 7/23  Rx denied, note attached to schedule visit.

## 2022-12-04 ENCOUNTER — Ambulatory Visit: Payer: Self-pay | Admitting: Licensed Clinical Social Worker

## 2022-12-04 DIAGNOSIS — R519 Headache, unspecified: Secondary | ICD-10-CM | POA: Diagnosis not present

## 2022-12-04 DIAGNOSIS — H6123 Impacted cerumen, bilateral: Secondary | ICD-10-CM | POA: Insufficient documentation

## 2022-12-04 DIAGNOSIS — H938X3 Other specified disorders of ear, bilateral: Secondary | ICD-10-CM | POA: Diagnosis not present

## 2022-12-04 NOTE — Patient Outreach (Signed)
  Care Coordination  Follow Up Visit Note   12/04/2022 Name: [name] MRN: [id number] DOB: 1961/03/17  [name] is a 61 y.o. year old female who sees Biagio Borg, MD for primary care. I spoke with  [name] by phone today.  What matters to the patients health and wellness today?  Managing her physical and mental health Will call agencies discussed today for counseling    Goals Addressed             This Visit's Progress    Care Coordination Activities / mental health support       Care Coordination Interventions: Reviewed Care Coordination Services:  Made referral to RN Care Coordinator : COPD and Diabetes management Motivational Interviewing employed Solution-Focused Strategies employed:  Emotional Support Provided Problem [place] strategies reviewed Provided general psycho-education for mental health needs  Discussed referral options to connect for ongoing therapy: assess barriers, need. . Collaborated to find therapy options based on need and insurance.  Provided the follow information   [facility] [address], [place] [address] [email or web address]  Phone: [phone]  [facility]:  [email or web address]   [place]               SDOH assessments and interventions completed:  No    Care Coordination Interventions:  Yes, provided   Follow up plan: Follow up call scheduled for 12/13/22    Encounter Outcome:  Pt. Visit Completed   [name], [place] [phone]

## 2022-12-04 NOTE — Patient Instructions (Signed)
Visit Information  Thank you for taking time to visit with me today. Please don't hesitate to contact me if I can be of assistance to you.   Following are the goals we discussed today:   Goals Addressed             This Visit's Progress    Care Coordination Activities / mental health support       Care Coordination Interventions: Reviewed Care Coordination Services:  Made referral to RN Care Coordinator : COPD and Diabetes management Motivational Interviewing employed Solution-Focused Strategies employed:  Emotional Support Provided Problem [place] strategies reviewed Provided general psycho-education for mental health needs  Discussed referral options to connect for ongoing therapy: assess barriers, need. . Collaborated to find therapy options based on need and insurance.  Provided the follow information   [facility] [address], [place] [address] [email or web address]  Phone: [phone]  [facility]:  [email or web address]   [place]               Our next appointment is by telephone on 12/13/22   Please call the care guide team at [phone] if you need to cancel or reschedule your appointment.   If you are experiencing a Mental Health or [place] or need someone to talk to, please call the Suicide and Crisis Lifeline: 988 call the [place] National Suicide Prevention Lifeline: (850)868-7898 or TTY: (831)562-4359 TTY 786-820-5993) to talk to a trained counselor call 1-800-273-TALK (toll free, 24 hour hotline) go to [facility] [address] [place] [phone])   Patient verbalizes understanding of instructions and care plan provided today and agrees to view in [place]. Active MyChart status and patient understanding of how to access instructions and care plan via MyChart  confirmed with patient.     Casimer Lanius, [place] [phone]

## 2022-12-05 ENCOUNTER — Encounter: Payer: Self-pay | Admitting: Emergency Medicine

## 2022-12-05 ENCOUNTER — Ambulatory Visit (INDEPENDENT_AMBULATORY_CARE_PROVIDER_SITE_OTHER): Payer: Medicare HMO | Admitting: Emergency Medicine

## 2022-12-05 VITALS — BP 132/82 | HR 98 | Temp 98.1°F | Ht 63.0 in | Wt 187.0 lb

## 2022-12-05 DIAGNOSIS — J9612 Chronic respiratory failure with hypercapnia: Secondary | ICD-10-CM

## 2022-12-05 DIAGNOSIS — J9611 Chronic respiratory failure with hypoxia: Secondary | ICD-10-CM | POA: Diagnosis not present

## 2022-12-05 DIAGNOSIS — K219 Gastro-esophageal reflux disease without esophagitis: Secondary | ICD-10-CM

## 2022-12-05 DIAGNOSIS — J309 Allergic rhinitis, unspecified: Secondary | ICD-10-CM

## 2022-12-05 DIAGNOSIS — J9601 Acute respiratory failure with hypoxia: Secondary | ICD-10-CM

## 2022-12-05 DIAGNOSIS — J4489 Other specified chronic obstructive pulmonary disease: Secondary | ICD-10-CM | POA: Diagnosis not present

## 2022-12-05 DIAGNOSIS — J9602 Acute respiratory failure with hypercapnia: Secondary | ICD-10-CM | POA: Diagnosis not present

## 2022-12-05 LAB — PULMONARY FUNCTION TEST
DL/VA % pred: 71 %
DL/VA: 3.07 ml/min/mmHg/L
DLCO cor % pred: 49 %
DLCO cor: 9.38 ml/min/mmHg
DLCO unc % pred: 45 %
DLCO unc: 8.64 ml/min/mmHg
FEF 25-75 Post: 0.48 L/sec
FEF 25-75 Pre: 0.25 L/sec
FEF2575-%Change-Post: 93 %
FEF2575-%Pred-Post: 21 %
FEF2575-%Pred-Pre: 10 %
FEV1-%Change-Post: 27 %
FEV1-%Pred-Post: 36 %
FEV1-%Pred-Pre: 28 %
FEV1-Post: 0.87 L
FEV1-Pre: 0.68 L
FEV1FVC-%Change-Post: 7 %
FEV1FVC-%Pred-Pre: 61 %
FEV6-%Change-Post: 19 %
FEV6-%Pred-Post: 54 %
FEV6-%Pred-Pre: 45 %
FEV6-Post: 1.62 L
FEV6-Pre: 1.35 L
FEV6FVC-%Change-Post: 0 %
FEV6FVC-%Pred-Post: 99 %
FEV6FVC-%Pred-Pre: 98 %
FVC-%Change-Post: 18 %
FVC-%Pred-Post: 54 %
FVC-%Pred-Pre: 45 %
FVC-Post: 1.68 L
FVC-Pre: 1.42 L
Post FEV1/FVC ratio: 52 %
Post FEV6/FVC ratio: 96 %
Pre FEV1/FVC ratio: 48 %
Pre FEV6/FVC Ratio: 95 %
RV % pred: 189 %
RV: 3.53 L
TLC % pred: 107 %
TLC: 5.12 L

## 2022-12-05 NOTE — Progress Notes (Signed)
PFT done today. 

## 2022-12-05 NOTE — Assessment & Plan Note (Signed)
Using BiPAP reliably at night.  Continue same.  All of her supplies are in order

## 2022-12-05 NOTE — Assessment & Plan Note (Signed)
Severe obstruction with a positive bronchodilator response confirmed on pulmonary function testing today.  She did try the long-acting nebulized regimen as prescribed but stated that she had side effects, had more shortness of breath (question whether this exacerbated her upper airway irritation).  She went back to the [facility] and appears to be tolerating.  She wants a spacer and we will provide her with this.  Will work to control contributing factors including GERD and rhinitis.  She is benefiting from the [place]

## 2022-12-05 NOTE — Assessment & Plan Note (Signed)
Fairly good control on Pepcid and Protonix.

## 2022-12-05 NOTE — Progress Notes (Signed)
Subjective:    Patient ID: [name], female    DOB: 1961-03-01, 61 y.o.   MRN: [id number]  COPD She complains of cough, shortness of breath and wheezing. Associated symptoms include chest pain. Pertinent negatives include no fever, postnasal drip or rhinorrhea. Her past medical history is significant for COPD.    Hosp F/u Visit 06/26/22 --Ms. Cadle is 42 and has a history of tobacco use, asthmatic COPD, seizures versus pseudoseizures, allergic rhinitis.  Based on exam there may be a component of upper airway irritation syndrome contributing to her respiratory limitation as well.  Has been managed on Dupixent, chronic prednisone - off currently.  Also Xyzal, Singulair.  She has been admitted twice since last time I saw her in May, both times for acute on chronic hypercapnic respiratory failure for which she required BiPAP support.  She was able to be qualified for home BiPAP with a PCO2 57 done on 06/13/2022. She reports that she is feeling better although has low energy. She has exertional SOB. She continues to have labile voice quality with hoarseness. Loses her voice intermittently. No albuterol use since she got out of the hospital. Currently on pred '15mg'$  w plan to go to zero. She is getting used to BiPAP, believes that it has helped her breathing. She is on breztri. On flonase, xyzal, singulair. She is on ppi qd, has breakthrough reflux on this.   Acute OV 10/23/22 --61 year old woman with asthmatic COPD, frequent exacerbations that often have an upper airway/VCD component.  She is on therapy for chronic GERD, chronic rhinitis.  She also has a history of chronic hypercapnic respiratory failure and has home BiPAP. Currently managed on Flonase, Xyzal, Singulair, Dupixent every 14 days She was just taken off Breztri by Dr Lewanda Rife, started on perforomist, pulmicort yulpelri nebs.  She continues to have exertional SOB, but no cough or wheeze currently   ROV 12/05/22 --follow-up  visit for a 61 year old woman with asthmatic COPD with frequent exacerbations, irritable upper airway syndrome and VCD.  All impacted by GERD, chronic rhinitis.  She has chronic hypercapnic respiratory failure and is on home BiPAP, wears it reliably.  I saw her in November.  She had been changed to nebulized regimen of Perforomist, Pulmicort and Maretta Bees - tells me that she cannot tolerate these, caused SOB, wheeze. She went back to breztri. She uses albuterol a few times a day.  She is also on Dupixent every 14 days, Flonase, Xyzal, Singulair, Pepcid, Protonix.  She underwent pulmonary function testing today as below.   PFT performed 12/05/2022 and reviewed by me show very severe obstruction with a positive bronchodilator response, hyperinflated lung volumes, decreased diffusion capacity that does not fully correct when adjusted for alveolar volume.    Review of Systems  Constitutional:  Positive for fatigue. Negative for chills and fever.  HENT:  Negative for postnasal drip, rhinorrhea and sinus pressure.   Respiratory:  Positive for cough, shortness of breath and wheezing.   Cardiovascular:  Positive for chest pain. Negative for palpitations and leg swelling.       Objective:   Physical Exam Vitals:   12/05/22 1553  BP: 132/82  Pulse: 98  Temp: 98.1 F (36.7 C)  TempSrc: Oral  SpO2: 97%  Weight: 187 lb (84.8 kg)  Height: '5\' 3"'$  (1.6 m)   Gen: Pleasant, well-nourished, in no distress,  normal affect  ENT: No lesions,  mouth clear,  oropharynx clear, no postnasal drip  Neck: No JVD, no stridor  Lungs:  No use of accessory muscles, very distant but no wheezing.  Cardiovascular: RRR, heart sounds normal, no murmur or gallops, no peripheral edema  Musculoskeletal: No deformities, no cyanosis or clubbing  Neuro: alert, non focal  Skin: Warm, no lesions or rashes      Assessment & Plan:   Asthma-COPD overlap syndrome Severe obstruction with a positive bronchodilator response  confirmed on pulmonary function testing today.  She did try the long-acting nebulized regimen as prescribed but stated that she had side effects, had more shortness of breath (question whether this exacerbated her upper airway irritation).  She went back to the [place] and appears to be tolerating.  She wants a spacer and we will provide her with this.  Will work to control contributing factors including GERD and rhinitis.  She is benefiting from the Dupixent  Allergic rhinitis Continue fluticasone nasal spray, Xyzal, Singulair, Dupixent.  Follows with Dr. Ernst Bowler  Laryngopharyngeal reflux (LPR) Fairly good control on Pepcid and Protonix.  Chronic respiratory failure with hypoxia and hypercapnia (HCC) Using BiPAP reliably at night.  Continue same.  All of her supplies are in order  Baltazar Apo, MD, PhD 12/05/2022, 4:31 PM [facility] [phone] or if no answer [phone]

## 2022-12-05 NOTE — Assessment & Plan Note (Signed)
Continue fluticasone nasal spray, Xyzal, Singulair, Dupixent.  Follows with Dr. Ernst Bowler

## 2022-12-05 NOTE — Patient Instructions (Addendum)
We will stop your scheduled long-acting nebulizer medications.  Stop the Perforomist, Pulmicort, and Yupelri. Agree with going back on your Breztri 2 puffs twice a day.  Use this with a spacer.  Rinse and gargle after you use it. You can use albuterol either 2 puffs or 1 nebulizer treatment up to every 4 hours if needed for shortness of breath. Continue your Dupixent as you have been taking it Continue your BiPAP every night when sleeping Continue Flonase, Xyzal, Singulair Continue Pepcid and Protonix as you have been taking them Follow with Dr Lamonte Sakai in 6 months or sooner if you have any problems

## 2022-12-12 ENCOUNTER — Ambulatory Visit (HOSPITAL_COMMUNITY): Payer: Medicare HMO | Admitting: Psychiatry

## 2022-12-12 ENCOUNTER — Ambulatory Visit: Payer: Medicare HMO

## 2022-12-12 DIAGNOSIS — J3089 Other allergic rhinitis: Secondary | ICD-10-CM | POA: Diagnosis not present

## 2022-12-12 NOTE — Progress Notes (Signed)
VIAL EXP 12-13-23

## 2022-12-13 ENCOUNTER — Ambulatory Visit: Payer: Self-pay | Admitting: Licensed Clinical Social Worker

## 2022-12-13 NOTE — Patient Outreach (Signed)
  Care Coordination  Follow Up Visit Note   12/13/2022 Name: Taliana Mersereau MRN: [id number] DOB: 1961/12/07  Sherrell Puller [place] is a 61 y.o. year old female who sees Biagio Borg, MD for primary care. I spoke with  [name] by phone today.  What matters to the patients health and wellness today?  Managing her health  Patient is making progress with connecting to providers to manage her mental health needs.  LCSW faxed referral to [facility] and reminded patient of upcoming appointments. Recommendation: Patient may benefit from, and is in agreement to : keep appointment with Dr. Casimiro Needle   Goals Addressed             This [place] Coordination Activities / mental health support       Care Coordination Interventions: Reviewed Care Coordination Services:  Made referral to RN Care Coordinator : COPD and Diabetes management Solution-Focused Strategies employed:  Problem [place] strategies reviewed Provided general psycho-education for mental health needs  Discussed referral options to connect for ongoing therapy: assess barriers, need. . Made referral to agency below based on insurance and need  [facility] [address], [place] [address] [email or web address]  Phone: [phone]                SDOH assessments and interventions completed:  No   Care Coordination Interventions:  Yes, provided   Follow up plan:  Follow up call scheduled for Jan 2nd with RN Care Coordinator LCSW will /fu after [facility] has reached out to patient.    Encounter Outcome:  Pt. Visit Completed   Casimer Lanius, [place] [phone]

## 2022-12-13 NOTE — Patient Instructions (Signed)
Visit Information  Thank you for taking time to visit with me today. Please don't hesitate to contact me if I can be of assistance to you.   Following are the goals we discussed today:   Goals Addressed             This Visit's Progress    Care Coordination Activities / mental health support       Care Coordination Interventions: Reviewed Care Coordination Services:  Made referral to RN Care Coordinator : COPD and Diabetes management Solution-Focused Strategies employed:  Problem [place] strategies reviewed Provided general psycho-education for mental health needs  Discussed referral options to connect for ongoing therapy: assess barriers, need. . Made referral to agency below based on insurance and need  [facility] [address], [place] [address] [email or web address]  Phone: [phone]                 Please call the care guide team at [phone] if you need to cancel or reschedule your appointment.   If you are experiencing a Mental Health or [place] or need someone to talk to, please call the Suicide and Crisis Lifeline: 988 call the [place] National Suicide Prevention Lifeline: 346 530 1577 or TTY: 5811167699 TTY 312-543-8953) to talk to a trained counselor call 1-800-273-TALK (toll free, 24 hour hotline) go to [facility] [address] [place] [phone])   Patient verbalizes understanding of instructions and care plan provided today and agrees to view in [place]. Active MyChart status and patient understanding of how to access instructions and care plan via MyChart confirmed with patient.     Casimer Lanius, [place] [phone]

## 2022-12-18 ENCOUNTER — Ambulatory Visit: Payer: Self-pay

## 2022-12-18 NOTE — Patient Instructions (Signed)
Visit Information  Thank you for taking time to visit with me today. Please don't hesitate to contact me if I can be of assistance to you.   Following are the goals we discussed today:   Goals Addressed             This Visit's Progress    Support with Health managment education       Care Coordination Interventions: Advised patient to Contact [employer] to request service for BIPAP machine Encouraged to take medications as prescribed Discussed COPD action plan. Provided education on COPD action plan Reviewed upcoming/scheduled appointments Provided contact number for RNCM.       Our next appointment is by telephone on 01/17/23 at 1:30 pm  Please call the care guide team at [phone] if you need to cancel or reschedule your appointment.   If you are experiencing a Mental Health or [place] or need someone to talk to, please call the Suicide and Crisis Lifeline: [place], RN, MSN, BSN, CCM [place] [phone]   COPD Action Plan A COPD action plan is a description of what to do when you have a flare (exacerbation) of chronic obstructive pulmonary disease (COPD). Your action plan is a color-coded plan that lists the symptoms that indicate whether your condition is under control and what actions to take. If you have symptoms in the green zone, it means you are doing well that day. If you have symptoms in the yellow zone, it means you are having a bad day or an exacerbation. If you have symptoms in the red zone, you need urgent medical care. Follow the plan that you and your health care provider developed. Review your plan with your health care provider at each visit. Red zone Symptoms in this zone mean that you should get medical help right away. They include: Feeling very short of breath, even when you are resting. Not being able to do any activities because of poor breathing. Not being able to sleep because of poor breathing. Fever or  shaking chills. Feeling confused or very sleepy. Chest pain. Coughing up blood. If you have any of these symptoms, call emergency services (911 in the U.S.) or go to the nearest emergency room. Yellow zone Symptoms in this zone mean that your condition may be getting worse. They include: Feeling more short of breath than usual. Having less energy for daily activities than usual. Phlegm or mucus that is thicker than usual. Needing to use your rescue inhaler or nebulizer more often than usual. More ankle swelling than usual. Coughing more than usual. Feeling like you have a chest cold. Trouble sleeping due to COPD symptoms. Decreased appetite. COPD medicines not helping as much as usual. If you experience any "yellow" symptoms: Keep taking your daily medicines as directed. Use your quick-relief inhaler as told by your health care provider. If you were prescribed steroid medicine to take by mouth (oral medicine), start taking it as told by your health care provider. If you were prescribed an antibiotic medicine, start taking it as told by your health care provider. Do not stop taking the antibiotic even if you start to feel better. Use oxygen as told by your health care provider. Get more rest. Do your pursed-lip breathing exercises. Do not smoke. Avoid any irritants in the air. If your signs and symptoms do not improve after taking these steps, call your health care provider right away. Green zone Symptoms in this zone mean that you are doing  well. They include: Being able to do your usual activities and exercise. Having the usual amount of coughing, including the same amount of phlegm or mucus. Being able to sleep well. Having a good appetite. Where to find more information: You can find more information about COPD from: American Lung Association, My COPD Action Plan: www.lung.org COPD Foundation: www.copdfoundation.[place]:  https://wil[place].com/ Follow these instructions at home: Continue taking your daily medicines as told by your health care provider. Make sure you receive all the immunizations that your health care provider recommends, especially the pneumococcal and influenza vaccines. Wash your hands often with soap and water. Have family members wash their hands too. Regular hand washing can help prevent infections. Follow your usual exercise and diet plan. Avoid irritants in the air, such as smoke. Do not use any products that contain nicotine or tobacco. These products include cigarettes, chewing tobacco, and vaping devices, such as e-cigarettes. If you need help quitting, ask your health care provider. Summary A COPD action plan tells you what to do when you have a flare (exacerbation) of chronic obstructive pulmonary disease (COPD). Follow each action plan for your symptoms. If you have any symptoms in the red zone, call emergency services (911 in the U.S.) or go to the nearest emergency room. This information is not intended to replace advice given to you by your health care provider. Make sure you discuss any questions you have with your health care provider. Document Revised: 10/11/2020 Document Reviewed: 10/11/2020 Elsevier Patient Education  [place].

## 2022-12-18 NOTE — Patient Outreach (Signed)
  Care Coordination   Initial Visit Note   12/18/2022 Name: [name] MRN: [id number] DOB: 03-06-1961  [name] is a 62 y.o. year old female who sees Felicia Borg, MD for primary care. I spoke with  [name] by phone today.  What matters to the patients health and wellness today?  [name] reports history of COPD. Denies being familiar with the COPD action plan. She reports she uses BiPap machine nightly and states her machine needs servicing. She reports she received from [employer] and will contact [employer] to request.   Goals Addressed             This Visit's Progress    Support with Health managment education       Care Coordination Interventions: Advised patient to Contact [employer] to request service for BIPAP machine Encouraged to take medications as prescribed Discussed COPD action plan. Provided education on COPD action plan Reviewed upcoming/scheduled appointments Provided contact number for RNCM.       SDOH assessments and interventions completed:  No Recently completed 11/2022.   Care Coordination Interventions:  Yes, provided   Follow up plan: Follow up call scheduled for 01/17/23    Encounter Outcome:  Pt. Visit Completed  Felicia Silversmith, RN, MSN, BSN, [place] Coordinator [phone]

## 2022-12-19 DIAGNOSIS — J449 Chronic obstructive pulmonary disease, unspecified: Secondary | ICD-10-CM | POA: Diagnosis not present

## 2022-12-19 DIAGNOSIS — J9610 Chronic respiratory failure, unspecified whether with hypoxia or hypercapnia: Secondary | ICD-10-CM | POA: Diagnosis not present

## 2022-12-21 ENCOUNTER — Ambulatory Visit (INDEPENDENT_AMBULATORY_CARE_PROVIDER_SITE_OTHER): Payer: Medicare HMO | Admitting: Psychiatry

## 2022-12-21 DIAGNOSIS — F3110 Bipolar disorder, current episode manic without psychotic features, unspecified: Secondary | ICD-10-CM

## 2022-12-21 MED ORDER — CARBAMAZEPINE 200 MG PO TABS: ORAL_TABLET | ORAL | 3 refills | Status: DC

## 2022-12-21 MED ORDER — GABAPENTIN 100 MG PO CAPS: 100.0000 mg | ORAL_CAPSULE | Freq: Three times a day (TID) | ORAL | 3 refills | Status: DC
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
## 2022-12-27 ENCOUNTER — Telehealth: Payer: Self-pay | Admitting: Licensed Clinical Social Worker

## 2022-12-27 ENCOUNTER — Encounter: Payer: Self-pay | Admitting: Internal Medicine

## 2022-12-27 ENCOUNTER — Ambulatory Visit: Payer: Self-pay | Admitting: Licensed Clinical Social Worker
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
## 2022-12-27 NOTE — Patient Outreach (Signed)
  Care Coordination   12/27/2022 Name: Kiyona Mcnall MRN: [id number] DOB: 1961/06/03   Care Coordination Outreach Attempts:  An unsuccessful telephone outreach was attempted today to offer the patient information about available care coordination services as a benefit of their health plan.   Follow Up Plan:  Additional outreach attempts will be made to offer the patient care coordination information and services.   Encounter Outcome:  No Answer   Care Coordination Interventions:  No, not indicated    Casimer Lanius, [place] [phone]

## 2022-12-27 NOTE — Patient Instructions (Signed)
Visit Information  Thank you for taking time to visit with me today. Please don't hesitate to contact me if I can be of assistance to you.   Following are the goals we discussed today:   Goals Addressed             This Visit's Progress    Care Coordination Activities / mental health support       Care Coordination Interventions: Continues with monthly appointment with psychiatry  Solution-Focused Strategies employed:  Active listening / Reflection utilized  Emotional Support Provided Problem [place] strategies reviewed Participation in counseling encouraged : assessed for barriers with completing intake paperwork and assisted with options Discussed referral options to connect for ongoing therapy:  . Made referral to agency below based on insurance and need  [facility] [address], [place] [address] [email or web address]  Phone: [phone]                 Our next appointment is by telephone on 01/09/23 at 2:00  Please call the care guide team at [phone] if you need to cancel or reschedule your appointment.   If you are experiencing a Mental Health or [place] or need someone to talk to, please call the Suicide and Crisis Lifeline: 988 call the [place] National Suicide Prevention Lifeline: 920-650-7007 or TTY: 6518146149 TTY 727-056-4275) to talk to a trained counselor call 1-800-273-TALK (toll free, 24 hour hotline) go to [facility] [address] [place] [phone])   Patient verbalizes understanding of instructions and care plan provided today and agrees to view in [place]. Active MyChart status and patient understanding of how to access instructions and care plan via MyChart confirmed with patient.     Casimer Lanius, [place] [phone]

## 2022-12-28 ENCOUNTER — Ambulatory Visit (INDEPENDENT_AMBULATORY_CARE_PROVIDER_SITE_OTHER): Payer: Medicare HMO

## 2022-12-28 DIAGNOSIS — J4550 Severe persistent asthma, uncomplicated: Secondary | ICD-10-CM | POA: Diagnosis not present

## 2022-12-31 DIAGNOSIS — Z0100 Encounter for examination of eyes and vision without abnormal findings: Secondary | ICD-10-CM | POA: Diagnosis not present

## 2023-01-08 DIAGNOSIS — F6381 Intermittent explosive disorder: Secondary | ICD-10-CM | POA: Diagnosis not present

## 2023-01-09 ENCOUNTER — Other Ambulatory Visit: Payer: Self-pay

## 2023-01-09 ENCOUNTER — Ambulatory Visit: Payer: Self-pay

## 2023-01-09 ENCOUNTER — Ambulatory Visit: Payer: Self-pay | Admitting: Licensed Clinical Social Worker

## 2023-01-09 ENCOUNTER — Ambulatory Visit (INDEPENDENT_AMBULATORY_CARE_PROVIDER_SITE_OTHER): Payer: Medicare HMO | Admitting: Orthopaedic Surgery

## 2023-01-09 ENCOUNTER — Ambulatory Visit (INDEPENDENT_AMBULATORY_CARE_PROVIDER_SITE_OTHER): Payer: Medicare HMO

## 2023-01-09 DIAGNOSIS — M25551 Pain in right hip: Secondary | ICD-10-CM | POA: Diagnosis not present

## 2023-01-09 DIAGNOSIS — G8929 Other chronic pain: Secondary | ICD-10-CM | POA: Diagnosis not present

## 2023-01-09 DIAGNOSIS — M25552 Pain in left hip: Secondary | ICD-10-CM

## 2023-01-09 DIAGNOSIS — M25561 Pain in right knee: Secondary | ICD-10-CM | POA: Diagnosis not present

## 2023-01-09 DIAGNOSIS — M25511 Pain in right shoulder: Secondary | ICD-10-CM

## 2023-01-09 DIAGNOSIS — M25512 Pain in left shoulder: Secondary | ICD-10-CM

## 2023-01-09 DIAGNOSIS — M25562 Pain in left knee: Secondary | ICD-10-CM

## 2023-01-09 NOTE — Patient Instructions (Signed)
Visit Information  Thank you for taking time to visit with me today. Please don't hesitate to contact me if I can be of assistance to you.   Following are the goals we discussed today:   Goals Addressed             This Visit's Progress    COMPLETED: Care Coordination Activities / mental health support       Care Coordination Interventions: Continues with monthly and Quarterly appointment with psychiatry  Solution-Focused Strategies employed:  Problem [place] strategies reviewed Participation in counseling encouraged : continue therapy appointment with   [facility] [place], [place] [address] [email or web address]  Phone: [phone]                Please call the care guide team at [phone] if you need to cancel or reschedule your appointment.   If you are experiencing a Mental Health or [place] or need someone to talk to, please call the Suicide and Crisis Lifeline: 988 call the [place] National Suicide Prevention Lifeline: (929)255-9343 or TTY: 228-854-4526 TTY 615-250-5398) to talk to a trained counselor call 1-800-273-TALK (toll free, 24 hour hotline) go to [facility] [address], [place] [phone])   Patient verbalizes understanding of instructions and care plan provided today and agrees to view in [place]. Active MyChart status and patient understanding of how to access instructions and care plan via MyChart confirmed with patient.     No further follow up required: Social Work Care Coordination at this time  Casimer Lanius, [place] [phone]

## 2023-01-09 NOTE — Progress Notes (Signed)
The patient is a very pleasant 62 year old female that I am seeing for the first time.  She comes in for evaluation treatment of bilateral hip and bilateral knee pain.  She has also had some neck pain but she has had cervical spine surgery and says that she will reach out to her spine surgeon who is in town who did her neck surgery.  She denies any injuries.  She just has a lot of times where the knees hurt and the hips hurt.  She points to the lateral aspect of both hips and is globally with both knees.  She walks without an assistive device.  She has had 3 episodes of being hospitalized last year due to respiratory failure.  She says that is under better control and she is not on home O2 but does use a CPAP or BiPAP at night.  On exam she is moderately obese.  She is very pleasant to converse with.  Both hips move smoothly both knees move smoothly.  There is no effusion of either knee.  Both knees are ligamentously stable with full range of motion.  Both hips have just a little bit of pain over the trochanteric area.  I believe some of her knee pain is IT band related.  X-rays of both knees were reviewed and showed normal-appearing joint space and normal alignment with no acute findings.  She would best benefit from outpatient physical therapy for any modalities that can help decrease hip and knee pain.  This will be per the therapist discretion as well.  I can see her back in about 6 weeks after course of therapy.  There is really nothing else I recommend right now other than that and she understands this as well.  All question concerns were answered and addressed.

## 2023-01-09 NOTE — Patient Outreach (Signed)
  Care Coordination  Follow Up Visit Note   01/09/2023 Name: [name] MRN: [id number] DOB: [birth year]  [name] is a 62 y.o. year old female who sees Biagio Borg, MD for primary care. I spoke with  [name] by phone today.  What matters to the patients health and wellness today?    Patient is making progress with managing her mental health. She connected with therapist and has made a great connection.  Recommendation: Patient may benefit from, and is in agreement to : keep all appointments. .    Goals Addressed             This Visit's Progress    COMPLETED: Care Coordination Activities / mental health support       Care Coordination Interventions: Continues with monthly and Quarterly appointment with psychiatry  Solution-Focused Strategies employed:  Problem [place] strategies reviewed Participation in counseling encouraged : continue therapy appointment with   [name] [place], [place] [address] [email or web address]  Phone: [phone]                 SDOH assessments and interventions completed:  No   Care Coordination Interventions:  Yes, provided   Follow up plan: No further intervention required by LCSW at this time.  Goal met  Encounter Outcome:  Pt. Visit Completed   [name], [place] [phone]

## 2023-01-11 ENCOUNTER — Emergency Department (HOSPITAL_COMMUNITY): Payer: Medicare HMO

## 2023-01-11 ENCOUNTER — Inpatient Hospital Stay (HOSPITAL_COMMUNITY)
Admission: EM | Admit: 2023-01-11 | Discharge: 2023-01-15 | DRG: 193 | Disposition: A | Discharge status: Home / Self Care | Payer: Medicare HMO | Attending: Internal Medicine | Admitting: Internal Medicine

## 2023-01-11 ENCOUNTER — Other Ambulatory Visit: Payer: Self-pay

## 2023-01-11 ENCOUNTER — Ambulatory Visit: Payer: Medicare HMO

## 2023-01-11 ENCOUNTER — Encounter (HOSPITAL_COMMUNITY): Payer: Self-pay | Admitting: Emergency Medicine

## 2023-01-11 DIAGNOSIS — Z825 Family history of asthma and other chronic lower respiratory diseases: Secondary | ICD-10-CM

## 2023-01-11 DIAGNOSIS — R7303 Prediabetes: Secondary | ICD-10-CM | POA: Diagnosis present

## 2023-01-11 DIAGNOSIS — Z79899 Other long term (current) drug therapy: Secondary | ICD-10-CM

## 2023-01-11 DIAGNOSIS — J9601 Acute respiratory failure with hypoxia: Secondary | ICD-10-CM | POA: Diagnosis not present

## 2023-01-11 DIAGNOSIS — Z1152 Encounter for screening for COVID-19: Secondary | ICD-10-CM

## 2023-01-11 DIAGNOSIS — Z8249 Family history of ischemic heart disease and other diseases of the circulatory system: Secondary | ICD-10-CM

## 2023-01-11 DIAGNOSIS — G40909 Epilepsy, unspecified, not intractable, without status epilepticus: Secondary | ICD-10-CM | POA: Diagnosis present

## 2023-01-11 DIAGNOSIS — J439 Emphysema, unspecified: Secondary | ICD-10-CM | POA: Diagnosis not present

## 2023-01-11 DIAGNOSIS — J441 Chronic obstructive pulmonary disease with (acute) exacerbation: Secondary | ICD-10-CM | POA: Diagnosis present

## 2023-01-11 DIAGNOSIS — K219 Gastro-esophageal reflux disease without esophagitis: Secondary | ICD-10-CM | POA: Diagnosis present

## 2023-01-11 DIAGNOSIS — J45901 Unspecified asthma with (acute) exacerbation: Secondary | ICD-10-CM | POA: Diagnosis present

## 2023-01-11 DIAGNOSIS — Z87891 Personal history of nicotine dependence: Secondary | ICD-10-CM | POA: Diagnosis not present

## 2023-01-11 DIAGNOSIS — I739 Peripheral vascular disease, unspecified: Secondary | ICD-10-CM | POA: Diagnosis present

## 2023-01-11 DIAGNOSIS — F319 Bipolar disorder, unspecified: Secondary | ICD-10-CM | POA: Diagnosis present

## 2023-01-11 DIAGNOSIS — Z881 Allergy status to other antibiotic agents status: Secondary | ICD-10-CM | POA: Diagnosis not present

## 2023-01-11 DIAGNOSIS — E876 Hypokalemia: Secondary | ICD-10-CM | POA: Diagnosis present

## 2023-01-11 DIAGNOSIS — M797 Fibromyalgia: Secondary | ICD-10-CM | POA: Diagnosis present

## 2023-01-11 DIAGNOSIS — Z8673 Personal history of transient ischemic attack (TIA), and cerebral infarction without residual deficits: Secondary | ICD-10-CM

## 2023-01-11 DIAGNOSIS — Z882 Allergy status to sulfonamides status: Secondary | ICD-10-CM

## 2023-01-11 DIAGNOSIS — E785 Hyperlipidemia, unspecified: Secondary | ICD-10-CM | POA: Diagnosis present

## 2023-01-11 DIAGNOSIS — R000 Tachycardia, unspecified: Secondary | ICD-10-CM | POA: Diagnosis not present

## 2023-01-11 DIAGNOSIS — J101 Influenza due to other identified influenza virus with other respiratory manifestations: Principal | ICD-10-CM | POA: Diagnosis present

## 2023-01-11 DIAGNOSIS — D649 Anemia, unspecified: Secondary | ICD-10-CM | POA: Diagnosis present

## 2023-01-11 DIAGNOSIS — Z811 Family history of alcohol abuse and dependence: Secondary | ICD-10-CM

## 2023-01-11 DIAGNOSIS — I10 Essential (primary) hypertension: Secondary | ICD-10-CM | POA: Diagnosis present

## 2023-01-11 DIAGNOSIS — Z8701 Personal history of pneumonia (recurrent): Secondary | ICD-10-CM

## 2023-01-11 DIAGNOSIS — G4489 Other headache syndrome: Secondary | ICD-10-CM | POA: Diagnosis not present

## 2023-01-11 DIAGNOSIS — Z888 Allergy status to other drugs, medicaments and biological substances status: Secondary | ICD-10-CM | POA: Diagnosis not present

## 2023-01-11 DIAGNOSIS — E7800 Pure hypercholesterolemia, unspecified: Secondary | ICD-10-CM | POA: Diagnosis not present

## 2023-01-11 DIAGNOSIS — R0602 Shortness of breath: Secondary | ICD-10-CM | POA: Diagnosis not present

## 2023-01-11 DIAGNOSIS — Z90710 Acquired absence of both cervix and uterus: Secondary | ICD-10-CM

## 2023-01-11 DIAGNOSIS — Z800 Family history of malignant neoplasm of digestive organs: Secondary | ICD-10-CM

## 2023-01-11 DIAGNOSIS — Z803 Family history of malignant neoplasm of breast: Secondary | ICD-10-CM

## 2023-01-11 DIAGNOSIS — Z808 Family history of malignant neoplasm of other organs or systems: Secondary | ICD-10-CM

## 2023-01-11 DIAGNOSIS — Z841 Family history of disorders of kidney and ureter: Secondary | ICD-10-CM

## 2023-01-11 DIAGNOSIS — R059 Cough, unspecified: Secondary | ICD-10-CM | POA: Diagnosis not present

## 2023-01-11 DIAGNOSIS — R070 Pain in throat: Secondary | ICD-10-CM | POA: Diagnosis not present

## 2023-01-11 DIAGNOSIS — Z602 Problems related to living alone: Secondary | ICD-10-CM | POA: Diagnosis present

## 2023-01-11 DIAGNOSIS — Z87442 Personal history of urinary calculi: Secondary | ICD-10-CM

## 2023-01-11 DIAGNOSIS — Z833 Family history of diabetes mellitus: Secondary | ICD-10-CM

## 2023-01-11 LAB — CBC WITH DIFFERENTIAL/PLATELET
Abs Immature Granulocytes: 0.05 10*3/uL (ref 0.00–0.07)
Basophils Absolute: 0 10*3/uL (ref 0.0–0.1)
Basophils Relative: 0 %
Eosinophils Absolute: 0.1 10*3/uL (ref 0.0–0.5)
Eosinophils Relative: 2 %
HCT: 33 % — ABNORMAL LOW (ref 36.0–46.0)
Hemoglobin: 11.1 g/dL — ABNORMAL LOW (ref 12.0–15.0)
Immature Granulocytes: 1 %
Lymphocytes Relative: 17 %
Lymphs Abs: 1.6 10*3/uL (ref 0.7–4.0)
MCH: 29.9 pg (ref 26.0–34.0)
MCHC: 33.6 g/dL (ref 30.0–36.0)
MCV: 88.9 fL (ref 80.0–100.0)
Monocytes Absolute: 1.2 10*3/uL — ABNORMAL HIGH (ref 0.1–1.0)
Monocytes Relative: 13 %
Neutro Abs: 6.5 10*3/uL (ref 1.7–7.7)
Neutrophils Relative %: 67 %
Platelets: 284 10*3/uL (ref 150–400)
RBC: 3.71 MIL/uL — ABNORMAL LOW (ref 3.87–5.11)
RDW: 14.9 % (ref 11.5–15.5)
WBC: 9.6 10*3/uL (ref 4.0–10.5)
nRBC: 0 % (ref 0.0–0.2)

## 2023-01-11 LAB — I-STAT VENOUS BLOOD GAS, ED
Acid-Base Excess: 8 mmol/L — ABNORMAL HIGH (ref 0.0–2.0)
Bicarbonate: 33.1 mmol/L — ABNORMAL HIGH (ref 20.0–28.0)
Calcium, Ion: 1.08 mmol/L — ABNORMAL LOW (ref 1.15–1.40)
HCT: 34 % — ABNORMAL LOW (ref 36.0–46.0)
Hemoglobin: 11.6 g/dL — ABNORMAL LOW (ref 12.0–15.0)
O2 Saturation: 96 %
Potassium: 4.1 mmol/L (ref 3.5–5.1)
Sodium: 138 mmol/L (ref 135–145)
TCO2: 34 mmol/L — ABNORMAL HIGH (ref 22–32)
pCO2, Ven: 45.5 mmHg (ref 44–60)
pH, Ven: 7.47 — ABNORMAL HIGH (ref 7.25–7.43)
pO2, Ven: 79 mmHg — ABNORMAL HIGH (ref 32–45)

## 2023-01-11 LAB — RESP PANEL BY RT-PCR (RSV, FLU A&B, COVID)  RVPGX2
Influenza A by PCR: POSITIVE — AB
Influenza B by PCR: NEGATIVE
Resp Syncytial Virus by PCR: NEGATIVE
SARS Coronavirus 2 by RT PCR: NEGATIVE

## 2023-01-11 LAB — BASIC METABOLIC PANEL
Anion gap: 12 (ref 5–15)
BUN: 7 mg/dL (ref 6–20)
CO2: 28 mmol/L (ref 22–32)
Calcium: 8.7 mg/dL — ABNORMAL LOW (ref 8.9–10.3)
Chloride: 98 mmol/L (ref 98–111)
Creatinine, Ser: 0.7 mg/dL (ref 0.44–1.00)
GFR, Estimated: >60 mL/min (ref >60)
Glucose, Bld: 145 mg/dL — ABNORMAL HIGH (ref 70–99)
Potassium: 3.4 mmol/L — ABNORMAL LOW (ref 3.5–5.1)
Sodium: 138 mmol/L (ref 135–145)

## 2023-01-11 MED ORDER — BUDESON-GLYCOPYRROL-FORMOTEROL 160-9-4.8 MCG/ACT IN AERO: 2.0000 | INHALATION_SPRAY | Freq: Two times a day (BID) | RESPIRATORY_TRACT | Status: DC

## 2023-01-11 MED ORDER — PANTOPRAZOLE SODIUM 40 MG PO TBEC
40.0000 mg | DELAYED_RELEASE_TABLET | Freq: Every day | ORAL | Status: DC
  Administered 2023-01-11 – 2023-01-15 (×5): 40 mg via ORAL
  Filled 2023-01-11 (×5): qty 1

## 2023-01-11 MED ORDER — ALBUTEROL SULFATE (2.5 MG/3ML) 0.083% IN NEBU
2.5000 mg | INHALATION_SOLUTION | RESPIRATORY_TRACT | Status: DC | PRN
  Administered 2023-01-11 – 2023-01-12 (×2): 2.5 mg via RESPIRATORY_TRACT
  Filled 2023-01-11 (×2): qty 3

## 2023-01-11 MED ORDER — ONDANSETRON HCL 4 MG/2ML IJ SOLN: 4.0000 mg | Freq: Four times a day (QID) | INTRAMUSCULAR | Status: DC | PRN

## 2023-01-11 MED ORDER — SUVOREXANT 15 MG PO TABS: 15.0000 mg | ORAL_TABLET | Freq: Every evening | ORAL | Status: DC | PRN

## 2023-01-11 MED ORDER — BISACODYL 5 MG PO TBEC: 5.0000 mg | DELAYED_RELEASE_TABLET | Freq: Every day | ORAL | Status: DC | PRN

## 2023-01-11 MED ORDER — POLYETHYLENE GLYCOL 3350 17 G PO PACK: 17.0000 g | PACK | Freq: Every day | ORAL | Status: DC | PRN

## 2023-01-11 MED ORDER — CYCLOBENZAPRINE HCL 5 MG PO TABS: 5.0000 mg | ORAL_TABLET | Freq: Three times a day (TID) | ORAL | Status: DC | PRN

## 2023-01-11 MED ORDER — HYDROCHLOROTHIAZIDE 12.5 MG PO TABS
12.5000 mg | ORAL_TABLET | Freq: Every day | ORAL | Status: DC
  Administered 2023-01-11 – 2023-01-12 (×2): 12.5 mg via ORAL
  Filled 2023-01-11 (×2): qty 1

## 2023-01-11 MED ORDER — ATORVASTATIN CALCIUM 40 MG PO TABS
40.0000 mg | ORAL_TABLET | Freq: Every day | ORAL | Status: DC
  Administered 2023-01-11 – 2023-01-15 (×5): 40 mg via ORAL
  Filled 2023-01-11 (×5): qty 1

## 2023-01-11 MED ORDER — GABAPENTIN 100 MG PO CAPS
100.0000 mg | ORAL_CAPSULE | Freq: Three times a day (TID) | ORAL | Status: DC
  Administered 2023-01-11 – 2023-01-15 (×13): 100 mg via ORAL
  Filled 2023-01-11 (×13): qty 1

## 2023-01-11 MED ORDER — PREDNISONE 20 MG PO TABS
40.0000 mg | ORAL_TABLET | Freq: Every day | ORAL | Status: DC
  Administered 2023-01-12: 40 mg via ORAL
  Filled 2023-01-11: qty 2

## 2023-01-11 MED ORDER — UMECLIDINIUM BROMIDE 62.5 MCG/ACT IN AEPB
1.0000 | INHALATION_SPRAY | Freq: Every day | RESPIRATORY_TRACT | Status: DC
  Administered 2023-01-12 – 2023-01-15 (×4): 1 via RESPIRATORY_TRACT
  Filled 2023-01-11: qty 7

## 2023-01-11 MED ORDER — FAMOTIDINE 20 MG PO TABS
20.0000 mg | ORAL_TABLET | Freq: Every day | ORAL | Status: DC
  Administered 2023-01-11 – 2023-01-15 (×5): 20 mg via ORAL
  Filled 2023-01-11 (×5): qty 1

## 2023-01-11 MED ORDER — OSELTAMIVIR PHOSPHATE 75 MG PO CAPS
75.0000 mg | ORAL_CAPSULE | Freq: Once | ORAL | Status: AC
  Administered 2023-01-11: 75 mg via ORAL
  Filled 2023-01-11: qty 1

## 2023-01-11 MED ORDER — DOCUSATE SODIUM 100 MG PO CAPS
100.0000 mg | ORAL_CAPSULE | Freq: Two times a day (BID) | ORAL | Status: DC
  Administered 2023-01-11 – 2023-01-15 (×8): 100 mg via ORAL
  Filled 2023-01-11 (×8): qty 1

## 2023-01-11 MED ORDER — ZOLPIDEM TARTRATE 5 MG PO TABS
5.0000 mg | ORAL_TABLET | Freq: Every evening | ORAL | Status: DC | PRN
  Administered 2023-01-11: 5 mg via ORAL
  Filled 2023-01-11: qty 1

## 2023-01-11 MED ORDER — IRBESARTAN 150 MG PO TABS
75.0000 mg | ORAL_TABLET | Freq: Every day | ORAL | Status: DC
  Administered 2023-01-11 – 2023-01-15 (×5): 75 mg via ORAL
  Filled 2023-01-11 (×7): qty 1

## 2023-01-11 MED ORDER — OSELTAMIVIR PHOSPHATE 75 MG PO CAPS
75.0000 mg | ORAL_CAPSULE | Freq: Two times a day (BID) | ORAL | Status: DC
  Administered 2023-01-11 – 2023-01-15 (×8): 75 mg via ORAL
  Filled 2023-01-11 (×9): qty 1

## 2023-01-11 MED ORDER — HYDROXYZINE HCL 25 MG PO TABS: 25.0000 mg | ORAL_TABLET | Freq: Four times a day (QID) | ORAL | Status: DC | PRN

## 2023-01-11 MED ORDER — ACETAMINOPHEN 325 MG PO TABS
650.0000 mg | ORAL_TABLET | Freq: Once | ORAL | Status: AC
  Administered 2023-01-11: 650 mg via ORAL
  Filled 2023-01-11: qty 2

## 2023-01-11 MED ORDER — ALBUTEROL SULFATE (2.5 MG/3ML) 0.083% IN NEBU
10.0000 mg | INHALATION_SOLUTION | RESPIRATORY_TRACT | Status: AC
  Administered 2023-01-11: 10 mg via RESPIRATORY_TRACT
  Filled 2023-01-11: qty 12

## 2023-01-11 MED ORDER — POTASSIUM CHLORIDE CRYS ER 10 MEQ PO TBCR
10.0000 meq | EXTENDED_RELEASE_TABLET | Freq: Every day | ORAL | Status: DC
  Administered 2023-01-11 – 2023-01-15 (×5): 10 meq via ORAL
  Filled 2023-01-11 (×5): qty 1

## 2023-01-11 MED ORDER — SODIUM CHLORIDE 0.9% FLUSH
3.0000 mL | Freq: Two times a day (BID) | INTRAVENOUS | Status: DC
  Administered 2023-01-11 – 2023-01-15 (×7): 3 mL via INTRAVENOUS

## 2023-01-11 MED ORDER — IPRATROPIUM-ALBUTEROL 0.5-2.5 (3) MG/3ML IN SOLN
3.0000 mL | Freq: Four times a day (QID) | RESPIRATORY_TRACT | Status: DC
  Administered 2023-01-11 – 2023-01-14 (×14): 3 mL via RESPIRATORY_TRACT
  Filled 2023-01-11 (×13): qty 3

## 2023-01-11 MED ORDER — CARBAMAZEPINE 200 MG PO TABS
600.0000 mg | ORAL_TABLET | Freq: Every day | ORAL | Status: DC
  Administered 2023-01-11 – 2023-01-14 (×4): 600 mg via ORAL
  Filled 2023-01-11 (×5): qty 3

## 2023-01-11 MED ORDER — CARBAMAZEPINE 200 MG PO TABS
400.0000 mg | ORAL_TABLET | Freq: Every day | ORAL | Status: DC
  Administered 2023-01-11 – 2023-01-15 (×5): 400 mg via ORAL
  Filled 2023-01-11 (×6): qty 2

## 2023-01-11 MED ORDER — ONDANSETRON HCL 4 MG PO TABS: 4.0000 mg | ORAL_TABLET | Freq: Four times a day (QID) | ORAL | Status: DC | PRN

## 2023-01-11 MED ORDER — MAGNESIUM SULFATE 2 GM/50ML IV SOLN
2.0000 g | INTRAVENOUS | Status: AC
  Administered 2023-01-11: 2 g via INTRAVENOUS
  Filled 2023-01-11: qty 50

## 2023-01-11 MED ORDER — MOMETASONE FURO-FORMOTEROL FUM 200-5 MCG/ACT IN AERO
2.0000 | INHALATION_SPRAY | Freq: Two times a day (BID) | RESPIRATORY_TRACT | Status: DC
  Administered 2023-01-12 – 2023-01-15 (×7): 2 via RESPIRATORY_TRACT
  Filled 2023-01-11: qty 8.8

## 2023-01-11 MED ORDER — HYDROXYZINE PAMOATE 25 MG PO CAPS: 25.0000 mg | ORAL_CAPSULE | Freq: Four times a day (QID) | ORAL | Status: DC | PRN

## 2023-01-11 MED ORDER — MORPHINE SULFATE (PF) 2 MG/ML IV SOLN: 2.0000 mg | INTRAVENOUS | Status: DC | PRN

## 2023-01-11 MED ORDER — ACETAMINOPHEN 325 MG PO TABS
650.0000 mg | ORAL_TABLET | Freq: Four times a day (QID) | ORAL | Status: DC | PRN
  Administered 2023-01-12: 650 mg via ORAL
  Filled 2023-01-11: qty 2

## 2023-01-11 MED ORDER — ENOXAPARIN SODIUM 40 MG/0.4ML IJ SOSY
40.0000 mg | PREFILLED_SYRINGE | INTRAMUSCULAR | Status: DC
  Administered 2023-01-11 – 2023-01-14 (×4): 40 mg via SUBCUTANEOUS
  Filled 2023-01-11 (×4): qty 0.4

## 2023-01-11 MED ORDER — IPRATROPIUM BROMIDE 0.02 % IN SOLN
0.5000 mg | Freq: Once | RESPIRATORY_TRACT | Status: AC
  Administered 2023-01-11: 0.5 mg via RESPIRATORY_TRACT
  Filled 2023-01-11: qty 2.5

## 2023-01-11 MED ORDER — GUAIFENESIN ER 600 MG PO TB12
600.0000 mg | ORAL_TABLET | Freq: Two times a day (BID) | ORAL | Status: DC | PRN
  Administered 2023-01-12 (×2): 600 mg via ORAL
  Filled 2023-01-11 (×2): qty 1

## 2023-01-11 MED ORDER — HYDRALAZINE HCL 20 MG/ML IJ SOLN: 5.0000 mg | INTRAMUSCULAR | Status: DC | PRN

## 2023-01-11 MED ORDER — LACTATED RINGERS IV SOLN: INTRAVENOUS | Status: AC

## 2023-01-11 MED ORDER — METHYLPREDNISOLONE SODIUM SUCC 125 MG IJ SOLR
60.0000 mg | Freq: Two times a day (BID) | INTRAMUSCULAR | Status: AC
  Administered 2023-01-11 (×2): 60 mg via INTRAVENOUS
  Filled 2023-01-11 (×2): qty 2

## 2023-01-11 MED ORDER — OXYCODONE HCL 5 MG PO TABS
5.0000 mg | ORAL_TABLET | ORAL | Status: DC | PRN
  Administered 2023-01-14: 5 mg via ORAL
  Filled 2023-01-11: qty 1

## 2023-01-11 MED ORDER — REVEFENACIN 175 MCG/3ML IN SOLN
175.0000 ug | Freq: Every day | RESPIRATORY_TRACT | Status: DC
  Administered 2023-01-11 – 2023-01-15 (×4): 175 ug via RESPIRATORY_TRACT
  Filled 2023-01-11 (×5): qty 3

## 2023-01-11 MED ORDER — KETOTIFEN FUMARATE 0.035 % OP SOLN
1.0000 [drp] | Freq: Two times a day (BID) | OPHTHALMIC | Status: DC
  Administered 2023-01-11 – 2023-01-15 (×8): 1 [drp] via OPHTHALMIC
  Filled 2023-01-11: qty 5

## 2023-01-11 MED ORDER — ACETAMINOPHEN 650 MG RE SUPP: 650.0000 mg | Freq: Four times a day (QID) | RECTAL | Status: DC | PRN

## 2023-01-11 MED ORDER — MONTELUKAST SODIUM 10 MG PO TABS
10.0000 mg | ORAL_TABLET | Freq: Every day | ORAL | Status: DC
  Administered 2023-01-11 – 2023-01-14 (×4): 10 mg via ORAL
  Filled 2023-01-11 (×4): qty 1

## 2023-01-11 NOTE — ED Notes (Signed)
Delegated ambulatory pulse oximeter to EDT

## 2023-01-11 NOTE — Progress Notes (Addendum)
  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  States has 3  adult children, assist with care as needed. PTA independent with ADL's. DME : NIV , NEBULIZER  RW, and BSC. Pt is  non oxygen dependent. PCP: Dr. Cathlean Cower.  Transition of Care Department [facility]) has reviewed patient and no TOC needs have been identified at this time. We will continue to monitor patient advancement through interdisciplinary progression rounds. If new patient transition needs arise, please place a TOC consult.

## 2023-01-11 NOTE — ED Provider Notes (Signed)
Patient care assumed at shift handoff from [facility], PA-C.  Please see her note for full details  In short, 62 year old female patient with history of tobacco use, asthmatic COPD, seizures versus pseudoseizures, and allergic rhinitis presented to the emergency department for worsening shortness of breath over the past 3 days.  Patient required magnesium, continuous DuoNeb upon arrival for improvement with work of breathing  Initial laboratory work showed hemoglobin 11.1, no leukocytosis, mild hypokalemia, positive influenza A result Physical Exam  BP (!) 157/92 (BP Location: Right Arm)   Pulse (!) 103   Temp 99.1 F (37.3 C) (Oral)   Resp 18   Ht '5\' 3"'$  (1.6 m)   Wt 82.6 kg   SpO2 94%   BMI 32.24 kg/m   Physical Exam  Procedures  Procedures  ED Course / MDM    Medical Decision Making Amount and/or Complexity of Data Reviewed Labs: ordered. Radiology: ordered.  Risk OTC drugs. Prescription drug management.   Patient had mild desaturation with ambulation into the upper 80s.  She rebounded quickly but while talking to me had another desaturation in bed to the upper 80s.  Patient does not use oxygen at home.  Patient has been placed on 2 L/min at this time via nasal cannula.  I have requested consultation with the hospitalist. Dr.Yates agrees to see the patient for admission       Ronny Bacon 01/11/23 1036    Gareth Morgan, MD 01/11/23 2316

## 2023-01-11 NOTE — ED Notes (Signed)
Pt placed on 2L O2 via [place] per Amo PA

## 2023-01-11 NOTE — ED Notes (Signed)
ED TO INPATIENT HANDOFF REPORT  ED Nurse Name and Phone #:   S Name/Age/Gender [name] 62 y.o. female Room/Bed: 042C/042C  Code Status   Code Status: Full Code  Home/SNF/Other Home Patient oriented to: self, place, time, and situation Is this baseline? Yes   Triage Complete: Triage complete  Chief Complaint Influenza A [J10.1]  Triage Note Patient arrived via [facility] from home with respiratory distress. Patient states she has had cough and wheeze x3 days that worsened tonight. Patient tried home nebulizers without relief, states states her O2 machine not working. EMS found the patient wheezing, given '10mg'$  albuterol and '125mg'$  solumedrol. Wheeze noted at patient arrival to ED.  Per EMS: BP-180/120 RR-26 Spo2-97% on room air HR-114 CBG-110   Allergies Allergies  Allergen Reactions   Sulfa Antibiotics Nausea And Vomiting   Venofer [Iron Sucrose] Hives    Level of Care/Admitting Diagnosis ED Disposition     ED Disposition  Admit   Condition  --   [place]: [place] [[id number]]  Level of Care: Telemetry Medical [104]  May place patient in observation at [facility] or [place] if equivalent level of care is available:: Yes  Covid Evaluation: Confirmed COVID Negative  Diagnosis: Influenza A [409811]  Admitting Physician: Karmen Bongo [[id number]]  Attending Physician: Karmen Bongo [[id number]]          B Medical/Surgery History Past Medical History:  Diagnosis Date   Anemia, iron deficiency 12/22/2014   pt. 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      Bipolar disorder ([place])    with anxiety, depression   COPD (chronic obstructive pulmonary disease) ([place])    Eczema 05/18/2022   Fibromyalgia 05/14/2014   GERD  (gastroesophageal reflux disease)    History of kidney stones    Hyperlipidemia 04/20/2017   HYPERTENSION 05/12/2009   Qualifier: Diagnosis of  By: Lenn Cal Deborra Medina), [name]     Peripheral vascular disease ([place])    Pneumonia    Prediabetes 02/23/2014   pt. denies   Seizure ([place])    Stroke ([place]) 11/2020   Urticaria    Past Surgical History:  Procedure Laterality Date   ABDOMINAL HYSTERECTOMY     ANTERIOR CERVICAL DECOMP/DISCECTOMY FUSION N/A 07/28/2020   Procedure: ANTERIOR CERVICAL DECOMPRESSION/DISCECTOMY FUSION. INTERBODY PROTHESIS, PLATE/SCREWS CERVICAL THREE- CERVICAL FOUR, CERVICAL FOUR- CERVICAL FIVE;  Surgeon: Newman Pies, MD;  Location: [facility];  Service: Neurosurgery;  Laterality: N/A;   BACK SURGERY     COLONOSCOPY  12/20/2011   Procedure: COLONOSCOPY;  Surgeon: Landry Dyke, MD;  Location: [facility];  Service: Endoscopy;  Laterality: N/A;   COLONOSCOPY  03/05/2012   Procedure: COLONOSCOPY;  Surgeon: Landry Dyke, MD;  Location: [facility];  Service: Endoscopy;  Laterality: N/A;   DIAGNOSTIC LAPAROSCOPY     HEMORRHOID SURGERY     INCISE AND DRAIN ABCESS     KIDNEY STONE SURGERY     NECK SURGERY     x 2 Dr Orinda Kenner   SPINE SURGERY  2019   TOE SURGERY     TUBAL LIGATION       A IV Location/Drains/Wounds Patient Lines/Drains/Airways Status     Active Line/Drains/Airways     Name Placement date Placement time Site Days   Peripheral IV 01/11/23 20 G  Left;Posterior Wrist 01/11/23  --  Wrist  less than 1   External Urinary Catheter 01/11/23  0730  --  less than 1            Intake/Output Last 24 hours  Intake/Output Summary (Last 24 hours) at 01/11/2023 1329 Last data filed at 01/11/2023 1214 Gross per 24 hour  Intake 50 ml  Output 1400 ml  Net -1350 ml    Labs/Imaging Results for orders placed or performed during the hospital encounter of 01/11/23 (from the past 48 hour(s))  Resp panel by RT-PCR (RSV, Flu A&B, Covid) Anterior Nasal Swab      Status: Abnormal   Collection Time: 01/11/23  5:52 AM   Specimen: Anterior Nasal Swab  Result Value Ref Range   SARS Coronavirus 2 by RT PCR NEGATIVE NEGATIVE    Comment: (NOTE) SARS-CoV-2 target nucleic acids are NOT DETECTED.  The SARS-CoV-2 RNA is generally detectable in upper respiratory specimens during the acute phase of infection. The lowest concentration of SARS-CoV-2 viral copies this assay can detect is 138 copies/mL. A negative result does not preclude SARS-Cov-2 infection and should not be used as the sole basis for treatment or other patient management decisions. A negative result may occur with  improper specimen collection/handling, submission of specimen other than nasopharyngeal swab, presence of viral mutation(s) within the areas targeted by this assay, and inadequate number of viral copies(<138 copies/mL). A negative result must be combined with clinical observations, patient history, and epidemiological information. The expected result is Negative.  Fact Sheet for Patients:  EntrepreneurPulse.com.au  Fact Sheet for Healthcare Providers:  IncredibleEmployment.be  This test is no t yet approved or cleared by the [place] FDA and  has been authorized for detection and/or diagnosis of SARS-CoV-2 by FDA under an Emergency Use Authorization (EUA). This EUA will remain  in effect (meaning this test can be used) for the duration of the COVID-19 declaration under Section 564(b)(1) of the Act, 21 U.S.C.section 360bbb-3(b)(1), unless the authorization is terminated  or revoked sooner.       Influenza A by PCR POSITIVE (A) NEGATIVE   Influenza B by PCR NEGATIVE NEGATIVE    Comment: (NOTE) The Xpert Xpress SARS-CoV-2/FLU/RSV plus assay is intended as an aid in the diagnosis of influenza from Nasopharyngeal swab specimens and should not be used as a sole basis for treatment. Nasal washings and aspirates are unacceptable for  Xpert Xpress SARS-CoV-2/FLU/RSV testing.  Fact Sheet for Patients: EntrepreneurPulse.com.au  Fact Sheet for Healthcare Providers: IncredibleEmployment.be  This test is not yet approved or cleared by the [place] FDA and has been authorized for detection and/or diagnosis of SARS-CoV-2 by FDA under an Emergency Use Authorization (EUA). This EUA will remain in effect (meaning this test can be used) for the duration of the COVID-19 declaration under Section 564(b)(1) of the Act, 21 U.S.C. section 360bbb-3(b)(1), unless the authorization is terminated or revoked.     Resp Syncytial Virus by PCR NEGATIVE NEGATIVE    Comment: (NOTE) Fact Sheet for Patients: EntrepreneurPulse.com.au  Fact Sheet for Healthcare Providers: IncredibleEmployment.be  This test is not yet approved or cleared by the [place] FDA and has been authorized for detection and/or diagnosis of SARS-CoV-2 by FDA under an Emergency Use Authorization (EUA). This EUA will remain in effect (meaning this test can be used) for the duration of the COVID-19 declaration under Section 564(b)(1) of the Act, 21 U.S.C. section 360bbb-3(b)(1), unless the authorization is terminated or revoked.  Performed at [facility]  Lab, [address] [place], [place] [address]   CBC with Differential     Status: Abnormal   Collection Time: 01/11/23  5:55 AM  Result Value Ref Range   WBC 9.6 4.0 - 10.5 K/uL   RBC 3.71 (L) 3.87 - 5.11 MIL/uL   Hemoglobin 11.1 (L) 12.0 - 15.0 g/dL   HCT 33.0 (L) 36.0 - 46.0 %   MCV 88.9 80.0 - 100.0 fL   MCH 29.9 26.0 - 34.0 pg   MCHC 33.6 30.0 - 36.0 g/dL   RDW 14.9 11.5 - 15.5 %   Platelets 284 150 - 400 K/uL   nRBC 0.0 0.0 - 0.2 %   Neutrophils Relative % 67 %   Neutro Abs 6.5 1.7 - 7.7 K/uL   Lymphocytes Relative 17 %   Lymphs Abs 1.6 0.7 - 4.0 K/uL   Monocytes Relative 13 %   Monocytes Absolute 1.2 (H) 0.1 -  1.0 K/uL   Eosinophils Relative 2 %   Eosinophils Absolute 0.1 0.0 - 0.5 K/uL   Basophils Relative 0 %   Basophils Absolute 0.0 0.0 - 0.1 K/uL   Immature Granulocytes 1 %   Abs Immature Granulocytes 0.05 0.00 - 0.07 K/uL    Comment: Performed at [facility], [place] [address] [place], [place] [address]  Basic metabolic panel     Status: Abnormal   Collection Time: 01/11/23  5:55 AM  Result Value Ref Range   Sodium 138 135 - 145 mmol/L   Potassium 3.4 (L) 3.5 - 5.1 mmol/L   Chloride 98 98 - 111 mmol/L   CO2 28 22 - 32 mmol/L   Glucose, Bld 145 (H) 70 - 99 mg/dL    Comment: Glucose reference range applies only to samples taken after fasting for at least 8 hours.   BUN 7 6 - 20 mg/dL   Creatinine, Ser 0.70 0.44 - 1.00 mg/dL   Calcium 8.7 (L) 8.9 - 10.3 mg/dL   GFR, Estimated >60 >60 mL/min    Comment: (NOTE) Calculated using the CKD-EPI Creatinine Equation (2021)    Anion gap 12 5 - 15    Comment: Performed at [facility] [address], [place], [place] [address]  I-Stat venous blood gas, [facility], [facility], [facility])     Status: Abnormal   Collection Time: 01/11/23  6:05 AM  Result Value Ref Range   pH, Ven 7.470 (H) 7.25 - 7.43   pCO2, Ven 45.5 44 - 60 mmHg   pO2, Ven 79 (H) 32 - 45 mmHg   Bicarbonate 33.1 (H) 20.0 - 28.0 mmol/L   TCO2 34 (H) 22 - 32 mmol/L   O2 Saturation 96 %   Acid-Base Excess 8.0 (H) 0.0 - 2.0 mmol/L   Sodium 138 135 - 145 mmol/L   Potassium 4.1 3.5 - 5.1 mmol/L   Calcium, Ion 1.08 (L) 1.15 - 1.40 mmol/L   HCT 34.0 (L) 36.0 - 46.0 %   Hemoglobin 11.6 (L) 12.0 - 15.0 g/dL   Sample type VENOUS    *Note: Due to a large number of results and/or encounters for the requested time period, some results have not been displayed. A complete set of results can be found in Results Review.   DG Chest Portable 1 View  Result Date: 01/11/2023 CLINICAL DATA:  62 year old female with shortness of breath. EXAM: PORTABLE CHEST 1 VIEW COMPARISON:  Chest CTA 10/08/2022  and earlier. FINDINGS: Portable AP semi upright view at 0601 hours. Chronic cervical  ACDF partially visible. Lower lung volumes. Mediastinal contours are stable and within normal limits. Allowing for portable technique the lungs are clear. No pneumothorax or pleural effusion. Centrilobular emphysema demonstrated by CT last year. Paucity of bowel gas the visible abdomen. No acute osseous abnormality identified. IMPRESSION: Emphysema (ICD10-J43.9). Lower lung volumes. No acute cardiopulmonary abnormality. Electronically Signed   By: Genevie Ann M.D.   On: 01/11/2023 06:12    Pending Labs Unresulted Labs (From admission, onward)    None       Vitals/Pain Today's Vitals   01/11/23 0945 01/11/23 0946 01/11/23 0950 01/11/23 1015  BP: (!) 157/92   (!) 154/95  Pulse: (!) 103   (!) 105  Resp: 18   (!) 23  Temp: 99.1 F (37.3 C)     TempSrc: Oral     SpO2: 94%  94% 99%  Weight:      Height:      PainSc:  10-Worst pain ever      Isolation Precautions No active isolations  Medications Medications  albuterol (PROVENTIL) (2.5 MG/3ML) 0.083% nebulizer solution 10 mg (0 mg Nebulization Stopped 01/11/23 0737)  atorvastatin (LIPITOR) tablet 40 mg (40 mg Oral Given 01/11/23 1243)  hydrochlorothiazide (HYDRODIURIL) tablet 12.5 mg (12.5 mg Oral Given 01/11/23 1243)  irbesartan (AVAPRO) tablet 75 mg (has no administration in time range)  Suvorexant TABS 15 mg (has no administration in time range)  famotidine (PEPCID) tablet 20 mg (20 mg Oral Given 01/11/23 1243)  pantoprazole (PROTONIX) EC tablet 40 mg (40 mg Oral Given 01/11/23 1243)  carbamazepine (TEGRETOL) tablet 400 mg (has no administration in time range)  cyclobenzaprine (FLEXERIL) tablet 5 mg (has no administration in time range)  gabapentin (NEURONTIN) capsule 100 mg (100 mg Oral Given 01/11/23 1243)  potassium chloride (KLOR-CON M) CR tablet 10 mEq (10 mEq Oral Given 01/11/23 1243)  Budeson-Glycopyrrol-Formoterol 160-9-4.8 MCG/ACT AERO 2 puff (has  no administration in time range)  montelukast (SINGULAIR) tablet 10 mg (has no administration in time range)  revefenacin (YUPELRI) nebulizer solution 175 mcg (175 mcg Nebulization Given 01/11/23 1244)  ketotifen (ZADITOR) 0.035 % ophthalmic solution 1 drop (has no administration in time range)  methylPREDNISolone sodium succinate (SOLU-MEDROL) 125 mg/2 mL injection 60 mg (60 mg Intravenous Given 01/11/23 1243)    Followed by  predniSONE (DELTASONE) tablet 40 mg (has no administration in time range)  ipratropium-albuterol (DUONEB) 0.5-2.5 (3) MG/3ML nebulizer solution 3 mL (has no administration in time range)  albuterol (PROVENTIL) (2.5 MG/3ML) 0.083% nebulizer solution 2.5 mg (has no administration in time range)  enoxaparin (LOVENOX) injection 40 mg (has no administration in time range)  sodium chloride flush (NS) 0.9 % injection 3 mL (3 mLs Intravenous Given 01/11/23 1246)  lactated ringers infusion (has no administration in time range)  acetaminophen (TYLENOL) tablet 650 mg (has no administration in time range)    Or  acetaminophen (TYLENOL) suppository 650 mg (has no administration in time range)  morphine (PF) 2 MG/ML injection 2 mg (has no administration in time range)  zolpidem (AMBIEN) tablet 5 mg (has no administration in time range)  docusate sodium (COLACE) capsule 100 mg (100 mg Oral Given 01/11/23 1243)  polyethylene glycol (MIRALAX / GLYCOLAX) packet 17 g (has no administration in time range)  bisacodyl (DULCOLAX) EC tablet 5 mg (has no administration in time range)  ondansetron (ZOFRAN) tablet 4 mg (has no administration in time range)    Or  ondansetron (ZOFRAN) injection 4 mg (has no administration in time range)  guaiFENesin (  MUCINEX) 12 hr tablet 600 mg (has no administration in time range)  hydrALAZINE (APRESOLINE) injection 5 mg (has no administration in time range)  oseltamivir (TAMIFLU) capsule 75 mg (has no administration in time range)  oxyCODONE (Oxy IR/ROXICODONE)  immediate release tablet 5 mg (has no administration in time range)  hydrOXYzine (ATARAX) tablet 25 mg (has no administration in time range)  carbamazepine (TEGRETOL) tablet 600 mg (has no administration in time range)  magnesium sulfate IVPB 2 g 50 mL (0 g Intravenous Stopped 01/11/23 0738)  ipratropium (ATROVENT) nebulizer solution 0.5 mg (0.5 mg Nebulization Given 01/11/23 0614)  acetaminophen (TYLENOL) tablet 650 mg (650 mg Oral Given 01/11/23 0948)  oseltamivir (TAMIFLU) capsule 75 mg (75 mg Oral Given 01/11/23 1243)    Mobility walks     Focused Assessments Pulmonary Assessment Handoff:  Lung sounds: Bilateral Breath Sounds: Expiratory wheezes O2 Device: Nasal Cannula O2 Flow Rate (L/min): 2 L/min    R Recommendations: See Admitting Provider Note  Report given to:   Additional Notes:

## 2023-01-11 NOTE — Progress Notes (Signed)
Pt placed on BIPAP machine to rest, pt tolerating well at this time.

## 2023-01-11 NOTE — ED Triage Notes (Signed)
Patient arrived via [facility] from home with respiratory distress. Patient states she has had cough and wheeze x3 days that worsened tonight. Patient tried home nebulizers without relief, states states her O2 machine not working. EMS found the patient wheezing, given '10mg'$  albuterol and '125mg'$  solumedrol. Wheeze noted at patient arrival to ED.  Per EMS: BP-180/120 RR-26 Spo2-97% on room air HR-114 CBG-110

## 2023-01-11 NOTE — ED Notes (Signed)
When pt got out of the bed, pulse ox dropped to 87. Pt walked around for about 2 minutes and pulse ox ranged from 93-97. When pt got back into the bed, pulse ox dropped back to 89. Stayed with pt for another minute and pulse ox went back up to 96. Pt was wheezing the whole time during ambulation and RN was notified.

## 2023-01-11 NOTE — ED Provider Notes (Signed)
[place] EMERGENCY DEPARTMENT AT [facility] Provider Note   CSN: [id number] Arrival date & time: 01/11/23  4010     History  Chief Complaint  Patient presents with   Respiratory Distress    [name] is a 62 y.o. female.  62 y/o female with hx of tobacco use, asthmatic COPD, seizures versus pseudoseizures, and allergic rhinitis presents to the ED for shortness of breath. Symptoms have been progressive over the past 3 days. She has not been able to use her home nebulizer because her machine has been broken. Symptoms associated with cough and wheezing. No fevers, hemoptysis, syncope, leg swelling. Patient given 10mg  albuterol PTA and 125mg  Solumedrol. Patient reports compliance with her nightly CPAP.       Home Medications Prior to Admission medications   Medication Sig Start Date End Date Taking? Authorizing Provider  albuterol (PROVENTIL) (2.5 MG/3ML) 0.083% nebulizer solution Take 3 mLs (2.5 mg total) by nebulization every 6 (six) hours as needed for wheezing or shortness of breath. 06/01/22   Glade Lloyd, MD  albuterol (VENTOLIN HFA) 108 (90 Base) MCG/ACT inhaler Inhale 2 puffs into the lungs every 6 (six) hours as needed for wheezing or shortness of breath. 10/18/22   Alfonse Spruce, MD  atorvastatin (LIPITOR) 40 MG tablet Take 1 tablet (40 mg total) by mouth daily. TAKE 1 TABLET(40 MG) BY MOUTH DAILY Strength: 40 mg Patient taking differently: Take 40 mg by mouth daily. 05/23/22   Corwin Levins, MD  azelastine (OPTIVAR) 0.05 % ophthalmic solution Place 1 drop into both eyes 2 (two) times daily. 05/18/22   Corwin Levins, MD  BELSOMRA 15 MG TABS Take 15 mg by mouth at bedtime as needed (sleep). 05/24/22   [provider]  budesonide (PULMICORT) 0.5 MG/2ML nebulizer solution Pulmicort (budesonide) + Perforomist (formoterol) mixed into a nebulizer twice daily 10/18/22   Alfonse Spruce, MD  butalbital-acetaminophen-caffeine [facility])  [phone] MG tablet 1 tab by mouth once daily as needed for HA, limit 14 per month 09/17/22   Corwin Levins, MD  carbamazepine (TEGRETOL) 200 MG tablet 2 qam  3  qhs 12/21/22   Plovsky, Earvin Hansen, MD  cetirizine (ZYRTEC) 10 MG tablet Can take one tablet by mouth once daily if needed for runny nose. Patient taking differently: Take 10 mg by mouth daily as needed for rhinitis. 04/28/21   Hetty Blend, FNP  Cholecalciferol (VITAMIN D) 125 MCG (5000 UT) CAPS Take 5,000 Units by mouth daily.    [provider]  cyclobenzaprine (FLEXERIL) 5 MG tablet Take 5 mg by mouth 3 (three) times daily as needed. 10/25/22   [provider]  diclofenac Sodium (VOLTAREN) 1 % GEL Apply 4 g topically 4 (four) times daily. Patient taking differently: Apply 4 g topically 4 (four) times daily as needed (pain). 06/04/20   Melene Plan, DO  DUPIXENT 300 MG/2ML prefilled syringe Inject 300 mg into the skin every 28 (twenty-eight) days. 04/18/22   [provider]  famotidine (PEPCID) 20 MG tablet Take 1 tablet (20 mg total) by mouth daily. OTC 10/19/22 01/17/23  Alfonse Spruce, MD  ferrous sulfate 325 (65 FE) MG EC tablet Take 1 tablet (325 mg total) by mouth daily with breakfast. 09/14/22   Corwin Levins, MD  fluticasone [facility]) 50 MCG/ACT nasal spray Place 1 spray into both nostrils 2 (two) times daily as needed for allergies or rhinitis. 10/19/22   Alfonse Spruce, MD  formoterol (PERFOROMIST) 20 MCG/2ML nebulizer solution  Pulmicort (budesonide) + Perforomist (formoterol) mixed into a nebulizer twice daily 10/18/22   Alfonse Spruce, MD  gabapentin (NEURONTIN) 100 MG capsule Take 1 capsule (100 mg total) by mouth 3 (three) times daily. 12/21/22   Plovsky, Earvin Hansen, MD  hydrochlorothiazide (MICROZIDE) 12.5 MG capsule Take 1 capsule (12.5 mg total) by mouth daily. TAKE 1 CAPSULE(12.5 MG) BY MOUTH DAILY Strength: 12.5 mg Patient taking differently: Take 12.5 mg by mouth daily. 05/23/22   Corwin Levins, MD   hydrOXYzine (VISTARIL) 25 MG capsule Take 1 capsule (25 mg total) by mouth every 6 (six) hours as needed for anxiety. 09/26/22   Plovsky, Earvin Hansen, MD  irbesartan (AVAPRO) 75 MG tablet TAKE 1 TABLET(75 MG) BY MOUTH DAILY 11/06/21   Corwin Levins, MD  montelukast (SINGULAIR) 10 MG tablet Take 1 tablet (10 mg total) by mouth at bedtime. 10/18/22 01/16/23  Alfonse Spruce, MD  ondansetron (ZOFRAN ODT) 4 MG disintegrating tablet Take 1 tablet (4 mg total) by mouth every 8 (eight) hours as needed for nausea or vomiting. 05/19/21   Alfonse Spruce, MD  pantoprazole (PROTONIX) 40 MG tablet TAKE 1 TABLET BY MOUTH EVERY DAY Patient taking differently: Take 40 mg by mouth daily. 09/17/22   Corwin Levins, MD  potassium chloride (KLOR-CON M) 10 MEQ tablet Take 1 tablet (10 mEq total) by mouth daily. 05/23/22   Corwin Levins, MD  revefenacin (YUPELRI) 175 MCG/3ML nebulizer solution Take 3 mLs (175 mcg total) by nebulization daily. 10/18/22   Alfonse Spruce, MD  Skin Protectants, Misc. (EUCERIN) cream Apply 1 Application topically daily as needed for dry skin.    [provider]  tacrolimus (PROTOPIC) 0.03 % ointment Apply topically 2 (two) times daily. Patient taking differently: Apply 1 Application topically 2 (two) times daily as needed (rash). 08/02/22   Alfonse Spruce, MD  triamcinolone ointment (KENALOG) 0.1 % Apply 1 Application topically 2 (two) times daily. 10/25/22   Hetty Blend, FNP      Allergies    Sulfa antibiotics and Venofer [iron sucrose]    Review of Systems   Review of Systems Ten systems reviewed and are negative for acute change, except as noted in the HPI.    Physical Exam Updated Vital Signs BP (!) 127/107   Pulse (!) 110   Temp 99.1 F (37.3 C) (Oral)   Resp (!) 22   Ht 5\' 3"  (1.6 m)   Wt 82.6 kg   SpO2 98%   BMI 32.24 kg/m   Physical Exam Vitals and nursing note reviewed.  Constitutional:      General: She is not in acute distress.     Appearance: She is well-developed. She is not diaphoretic.     Comments: Obese AA female. No acute distress.  HENT:     Head: Normocephalic and atraumatic.  Eyes:     General: No scleral icterus.    Conjunctiva/sclera: Conjunctivae normal.  Cardiovascular:     Rate and Rhythm: Regular rhythm. Tachycardia present.     Pulses: Normal pulses.  Pulmonary:     Effort: Tachypnea present. No respiratory distress.     Breath sounds: Wheezing present.     Comments: Dyspneic and tachypneic with diffuse expiratory wheezing. Mild use of accessory muscles without distress. Maintaining sats on room air. Musculoskeletal:        General: Normal range of motion.     Cervical back: Normal range of motion.     Right lower leg: No edema.  Left lower leg: No edema.  Skin:    General: Skin is warm and dry.     Coloration: Skin is not pale.     Findings: No erythema or rash.  Neurological:     Mental Status: She is alert and oriented to person, place, and time.     Coordination: Coordination normal.  Psychiatric:        Behavior: Behavior normal.     ED Results / Procedures / Treatments   Labs (all labs ordered are listed, but only abnormal results are displayed) Labs Reviewed  CBC WITH DIFFERENTIAL/PLATELET - Abnormal; Notable for the following components:      Result Value   RBC 3.71 (*)    Hemoglobin 11.1 (*)    HCT 33.0 (*)    Monocytes Absolute 1.2 (*)    All other components within normal limits  I-STAT VENOUS BLOOD GAS, ED - Abnormal; Notable for the following components:   pH, Ven 7.470 (*)    pO2, Ven 79 (*)    Bicarbonate 33.1 (*)    TCO2 34 (*)    Acid-Base Excess 8.0 (*)    Calcium, Ion 1.08 (*)    HCT 34.0 (*)    Hemoglobin 11.6 (*)    All other components within normal limits  RESP PANEL BY RT-PCR (RSV, FLU A&B, COVID)  [id number]  BASIC METABOLIC PANEL    EKG None  Radiology DG Chest Portable 1 View  Result Date: 01/11/2023 CLINICAL DATA:  62 year old female with  shortness of breath. EXAM: PORTABLE CHEST 1 VIEW COMPARISON:  Chest CTA 10/08/2022 and earlier. FINDINGS: Portable AP semi upright view at 0601 hours. Chronic cervical ACDF partially visible. Lower lung volumes. Mediastinal contours are stable and within normal limits. Allowing for portable technique the lungs are clear. No pneumothorax or pleural effusion. Centrilobular emphysema demonstrated by CT last year. Paucity of bowel gas the visible abdomen. No acute osseous abnormality identified. IMPRESSION: Emphysema (ICD10-J43.9). Lower lung volumes. No acute cardiopulmonary abnormality. Electronically Signed   By: Odessa Fleming M.D.   On: 01/11/2023 06:12   XR Knee 1-2 Views Right  Result Date: 01/09/2023 2 views of the right knee are normal.  The alignment is normal and the joint spaces well-maintained.  XR Knee 1-2 Views Left  Result Date: 01/09/2023 2 views of the left knee show normal alignment no acute findings.  There is no effusion.   Procedures .Critical Care  Performed by: Antony Madura, PA-C Authorized by: Antony Madura, PA-C   Critical care provider statement:    Critical care time (minutes):  30   Critical care time was exclusive of:  Separately billable procedures and treating other patients   Critical care was necessary to treat or prevent imminent or life-threatening deterioration of the following conditions:  Respiratory failure   Critical care was time spent personally by me on the following activities:  Development of treatment plan with patient or surrogate, discussions with consultants, evaluation of patient's response to treatment, examination of patient, ordering and review of laboratory studies, ordering and review of radiographic studies, ordering and performing treatments and interventions, pulse oximetry, re-evaluation of patient's condition, review of old charts and obtaining history from patient or surrogate   I assumed direction of critical care for this patient from another  provider in my specialty: no       Medications Ordered in ED Medications  magnesium sulfate IVPB 2 g 50 mL (2 g Intravenous New Bag/Given 01/11/23 0608)  albuterol (PROVENTIL) (2.5 MG/3ML) 0.083%  nebulizer solution 10 mg (10 mg Nebulization New Bag/Given 01/11/23 1610)  ipratropium (ATROVENT) nebulizer solution 0.5 mg (0.5 mg Nebulization Given 01/11/23 9604)    ED Course/ Medical Decision Making/ A&P                             Medical Decision Making Amount and/or Complexity of Data Reviewed Labs: ordered. Radiology: ordered.  Risk Prescription drug management.   This patient presents to the ED for concern of SOB, this involves an extensive number of treatment options, and is a complaint that carries with it a high risk of complications and morbidity.  The differential diagnosis includes COPD exacerbation vs viral illness vs PNA vs PTX vs pleural effusion vs arrhythmia    Co morbidities that complicate the patient evaluation  Obesity Tobacco use COPD   Additional history obtained:  Additional history obtained from EMS External records from outside source obtained and reviewed including Echocardiogram from June 2021; grossly normal with EF of 60-65%.   Lab Tests:  I Ordered, and personally interpreted labs.  The pertinent results include:  Mild anemia of 11.1 (stable), Potassium of 3.4.   Imaging Studies ordered:  I ordered imaging studies including CXR  I independently visualized and interpreted imaging which showed emphysematous changes without acute cardiopulmonary abnormality I agree with the radiologist interpretation   Cardiac Monitoring:  The patient was maintained on a cardiac monitor.  I personally viewed and interpreted the cardiac monitored which showed an underlying rhythm of: sinus tachycardia   Medicines ordered and prescription drug management:  I ordered medication including continuous albuterol nebulizer and IV Magnesium for acute respiratory  failure.   I have reviewed the patients home medicines and have made adjustments as needed   Critical Interventions:  Initiation of continuous albuterol treatment   Social Determinants of Health:  Insured patient   Dispostion:  Care signed out to [place], [place] at shift change pending reassessment.          Final Clinical Impression(s) / ED Diagnoses Final diagnoses:  COPD exacerbation [facility])    Rx / DC Orders ED Discharge Orders     None         Antony Madura, PA-C 01/11/23 5409    Nira Conn, MD 01/12/23 [phone]

## 2023-01-11 NOTE — H&P (Signed)
History and Physical    Patient: [name] HBZ:[id number] DOB: 1961-03-28 DOA: 01/11/2023 DOS: the patient was seen and examined on 01/11/2023 PCP: Biagio Borg, MD  Patient coming from: Home - lives alone; NOK: Daughter, [place], [phone]   Chief Complaint: SOB  HPI: [name] is a 62 y.o. 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   She has a h/o asthma/COPD overlap syndrome and has noticed feeling SOB when trying to get to the bathroom in her house for about a month now.  She has also been feeling weak and tired and attributed these symptoms to receiving her shingles vaccine again about a month ago.  Over the last couple of days, she has had a periodic dry cough and worsening SOB.  She was so SOB last night that she hit her life alert button to call 911.      ER Course:  Asthma exacerbation.  Given Mag++, continuous nebs.  + for Influenza A.  O2 80s with ambulation and conversation.  Likely needs further nebs.  Ordered Tamiflu.     Review of Systems: As mentioned in the history of present illness. All other systems reviewed and are negative. Past Medical History:  Diagnosis Date   Anemia, iron deficiency 12/22/2014   pt. 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      Bipolar disorder ([place])    with anxiety, depression   COPD (chronic obstructive pulmonary disease) ([place])    Eczema 05/18/2022   Fibromyalgia 05/14/2014   GERD (gastroesophageal reflux disease)    History of kidney stones    Hyperlipidemia 04/20/2017   HYPERTENSION 05/12/2009   Qualifier: Diagnosis of  By: Lenn Cal Deborra Medina), [name]     Peripheral vascular disease  ([place])    Pneumonia    Prediabetes 02/23/2014   pt. denies   Seizure ([place])    Stroke ([place]) 11/2020   Urticaria    Past Surgical History:  Procedure Laterality Date   ABDOMINAL HYSTERECTOMY     ANTERIOR CERVICAL DECOMP/DISCECTOMY FUSION N/A 07/28/2020   Procedure: ANTERIOR CERVICAL DECOMPRESSION/DISCECTOMY FUSION. INTERBODY PROTHESIS, PLATE/SCREWS CERVICAL THREE- CERVICAL FOUR, CERVICAL FOUR- CERVICAL FIVE;  Surgeon: Newman Pies, MD;  Location: [facility];  Service: Neurosurgery;  Laterality: N/A;   BACK SURGERY     COLONOSCOPY  12/20/2011   Procedure: COLONOSCOPY;  Surgeon: Landry Dyke, MD;  Location: [facility];  Service: Endoscopy;  Laterality: N/A;   COLONOSCOPY  03/05/2012   Procedure: COLONOSCOPY;  Surgeon: Landry Dyke, MD;  Location: [facility];  Service: Endoscopy;  Laterality: N/A;   DIAGNOSTIC LAPAROSCOPY     HEMORRHOID SURGERY     INCISE AND DRAIN ABCESS     KIDNEY STONE SURGERY     NECK SURGERY     x 2 Dr Orinda Kenner   SPINE SURGERY  2019   TOE SURGERY     TUBAL LIGATION     Social History:  reports that she quit smoking about 3 years ago. Her smoking use included cigarettes. She has a 9.00 pack-year smoking history. She has never used smokeless tobacco. She reports that she does not currently use alcohol. She reports that she does not currently use drugs after having used the following  drugs: Marijuana.  Allergies  Allergen Reactions   Sulfa Antibiotics Nausea And Vomiting   Venofer [Iron Sucrose] Hives    Family History  Problem Relation Age of Onset   Diabetes Mother    COPD Mother    Heart disease Mother    Asthma Mother    Diabetes Father    Kidney disease Father    Anesthesia problems Father    Alcohol abuse Father    Colon cancer Father 45   Diabetes Sister    Hypertension Sister    Heart disease Sister    Diabetes Sister    Brain cancer Sister    Hypertension Sister    Asthma Sister    Diabetes Sister    COPD Sister    Hypertension  Sister    Breast cancer Sister 55   Anemia Sister    Hypertension Sister    Diabetes Sister    Anemia Sister    Hypertension Sister    Diabetes Brother    Sleep apnea Brother    Asthma Brother    Alcohol abuse Brother    Heart disease Brother    Heart disease Brother    Hypertension Brother    Hypertension Daughter    Anemia Daughter    Allergic rhinitis Neg Hx    Eczema Neg Hx    Urticaria Neg Hx    Esophageal cancer Neg Hx    Prostate cancer Neg Hx    Rectal cancer Neg Hx     Prior to Admission medications   Medication Sig Start Date End Date Taking? Authorizing Provider  albuterol (VENTOLIN HFA) 108 (90 Base) MCG/ACT inhaler Inhale 2 puffs into the lungs every 6 (six) hours as needed for wheezing or shortness of breath. 10/18/22  Yes Valentina Shaggy, MD  atorvastatin (LIPITOR) 40 MG tablet Take 1 tablet (40 mg total) by mouth daily. TAKE 1 TABLET(40 MG) BY MOUTH DAILY Strength: 40 mg Patient taking differently: Take 40 mg by mouth daily. 05/23/22  Yes Biagio Borg, MD  azelastine (OPTIVAR) 0.05 % ophthalmic solution Place 1 drop into both eyes 2 (two) times daily. 05/18/22  Yes Biagio Borg, MD  BELSOMRA 15 MG TABS Take 15 mg by mouth at bedtime as needed (sleep). 05/24/22  Yes [provider]  BREZTRI AEROSPHERE 160-9-4.8 MCG/ACT AERO Inhale 2 puffs into the lungs in the morning and at bedtime. 11/27/22  Yes [provider]  butalbital-acetaminophen-caffeine (FIORICET) 50-325-40 MG tablet 1 tab by mouth once daily as needed for HA, limit 14 per month 09/17/22  Yes Biagio Borg, MD  carbamazepine (TEGRETOL) 200 MG tablet 2 qam  3  qhs Patient taking differently: Take 400-600 mg by mouth See admin instructions. Take '400mg'$  in the morning and '600mg'$  at bedtime. 12/21/22  Yes Plovsky, Berneta Sages, MD  cetirizine (ZYRTEC) 10 MG tablet Can take one tablet by mouth once daily if needed for runny nose. Patient taking differently: Take 10 mg by mouth daily as needed for  rhinitis. 04/28/21  Yes Ambs, Kathrine Cords, FNP  Cholecalciferol (VITAMIN D) 125 MCG (5000 UT) CAPS Take 5,000 Units by mouth daily.   Yes [provider]  cyclobenzaprine (FLEXERIL) 5 MG tablet Take 5 mg by mouth 3 (three) times daily as needed for muscle spasms. 10/25/22  Yes [provider]  diclofenac Sodium (VOLTAREN) 1 % GEL Apply 4 g topically 4 (four) times daily. Patient taking differently: Apply 4 g topically 4 (four) times daily as needed (pain). 06/04/20  Yes  Deno Etienne, DO  DUPIXENT 300 MG/2ML prefilled syringe Inject 300 mg into the skin every 28 (twenty-eight) days. 04/18/22  Yes [provider]  famotidine (PEPCID) 20 MG tablet Take 1 tablet (20 mg total) by mouth daily. OTC 10/19/22 01/17/23 Yes Valentina Shaggy, MD  ferrous sulfate 325 (65 FE) MG EC tablet Take 1 tablet (325 mg total) by mouth daily with breakfast. 09/14/22  Yes Biagio Borg, MD  fluticasone [facility]) 50 MCG/ACT nasal spray Place 1 spray into both nostrils 2 (two) times daily as needed for allergies or rhinitis. 10/19/22  Yes Valentina Shaggy, MD  gabapentin (NEURONTIN) 100 MG capsule Take 1 capsule (100 mg total) by mouth 3 (three) times daily. 12/21/22  Yes Plovsky, Berneta Sages, MD  hydrochlorothiazide (MICROZIDE) 12.5 MG capsule Take 1 capsule (12.5 mg total) by mouth daily. TAKE 1 CAPSULE(12.5 MG) BY MOUTH DAILY Strength: 12.5 mg Patient taking differently: Take 12.5 mg by mouth daily. 05/23/22  Yes Biagio Borg, MD  hydrOXYzine (VISTARIL) 25 MG capsule Take 1 capsule (25 mg total) by mouth every 6 (six) hours as needed for anxiety. 09/26/22  Yes Plovsky, Berneta Sages, MD  irbesartan (AVAPRO) 75 MG tablet TAKE 1 TABLET(75 MG) BY MOUTH DAILY Patient taking differently: Take 75 mg by mouth daily. 11/06/21  Yes Biagio Borg, MD  montelukast (SINGULAIR) 10 MG tablet Take 1 tablet (10 mg total) by mouth at bedtime. 10/18/22 01/16/23 Yes Valentina Shaggy, MD  ondansetron (ZOFRAN ODT) 4 MG disintegrating  tablet Take 1 tablet (4 mg total) by mouth every 8 (eight) hours as needed for nausea or vomiting. 05/19/21  Yes Valentina Shaggy, MD  pantoprazole (PROTONIX) 40 MG tablet TAKE 1 TABLET BY MOUTH EVERY DAY Patient taking differently: Take 40 mg by mouth daily. 09/17/22  Yes Biagio Borg, MD  potassium chloride (KLOR-CON M) 10 MEQ tablet Take 1 tablet (10 mEq total) by mouth daily. 05/23/22  Yes Biagio Borg, MD  revefenacin [facility]) 175 MCG/3ML nebulizer solution Take 3 mLs (175 mcg total) by nebulization daily. 10/18/22  Yes Valentina Shaggy, MD  Skin Protectants, Misc. (EUCERIN) cream Apply 1 Application topically daily as needed for dry skin.   Yes [provider]  tacrolimus (PROTOPIC) 0.03 % ointment Apply topically 2 (two) times daily. Patient taking differently: Apply 1 Application topically 2 (two) times daily as needed (rash). 08/02/22  Yes Valentina Shaggy, MD  triamcinolone ointment (KENALOG) 0.1 % Apply 1 Application topically 2 (two) times daily. 10/25/22  Yes Ambs, Kathrine Cords, FNP  albuterol (PROVENTIL) (2.5 MG/3ML) 0.083% nebulizer solution Take 3 mLs (2.5 mg total) by nebulization every 6 (six) hours as needed for wheezing or shortness of breath. Patient not taking: Reported on 01/11/2023 06/01/22   Aline August, MD  budesonide (PULMICORT) 0.5 MG/2ML nebulizer solution Pulmicort (budesonide) + Perforomist (formoterol) mixed into a nebulizer twice daily Patient not taking: Reported on 01/11/2023 10/18/22   Valentina Shaggy, MD  formoterol (PERFOROMIST) 20 MCG/2ML nebulizer solution Pulmicort (budesonide) + Perforomist (formoterol) mixed into a nebulizer twice daily Patient not taking: Reported on 01/11/2023 10/18/22   Valentina Shaggy, MD    Physical Exam: Vitals:   01/11/23 1330 01/11/23 1334 01/11/23 1445 01/11/23 1500  BP: (!) 133/95   136/86  Pulse: 97   95  Resp: (!) 21   18  Temp:  98.1 F (36.7 C)  98 F (36.7 C)  TempSrc:  Oral  Oral  SpO2: 95%   98% 96%  Weight:      Height:       General:  Appears calm and comfortable and is in NAD, on [place] O2 Eyes:  PERRL, EOMI, normal lids, iris ENT:  grossly normal hearing, lips & tongue, mmm Neck:  no LAD, masses or thyromegaly Cardiovascular:  RRR, no m/r/g. No LE edema.  Respiratory:   CTA bilaterally with scattered wheezes, somewhat diminished air movement.  Normal respiratory effort. Abdomen:  soft, NT, ND Skin:  no rash or induration seen on limited exam Musculoskeletal:  grossly normal tone BUE/BLE, good ROM, no bony abnormality Psychiatric:  grossly normal mood and affect, speech fluent and appropriate, AOx3 Neurologic:  CN 2-12 grossly intact, moves all extremities in coordinated fashion   Radiological Exams on Admission: Independently reviewed - see discussion in A/P where applicable  DG Chest Portable 1 View  Result Date: 01/11/2023 CLINICAL DATA:  62 year old female with shortness of breath. EXAM: PORTABLE CHEST 1 VIEW COMPARISON:  Chest CTA 10/08/2022 and earlier. FINDINGS: Portable AP semi upright view at 0601 hours. Chronic cervical ACDF partially visible. Lower lung volumes. Mediastinal contours are stable and within normal limits. Allowing for portable technique the lungs are clear. No pneumothorax or pleural effusion. Centrilobular emphysema demonstrated by CT last year. Paucity of bowel gas the visible abdomen. No acute osseous abnormality identified. IMPRESSION: Emphysema (ICD10-J43.9). Lower lung volumes. No acute cardiopulmonary abnormality. Electronically Signed   By: Genevie Ann M.D.   On: 01/11/2023 06:12    EKG: Independently reviewed.  Sinus tachycardia with rate 124; prolonged QTc 518; no evidence of acute ischemia   Labs on Admission: I have personally reviewed the available labs and imaging studies at the time of the admission.  Pertinent labs:    VBG: 7.470/45.5/33.1 K+ 3.4 Glucose 145 WBC 9.6 Hgb 11.1 Influenza A positive   Assessment and Plan: Principal  Problem:   Influenza A Active Problems:   Seizure disorder (HCC)   Essential (primary) hypertension   Bipolar I disorder (HCC)   Hyperlipidemia   Acute exacerbation of COPD with asthma ([place])    Influenza A -Supportive care -Tamiflu BID -IVF at 50 cc/hr -Observation on telemetry -Anticipate resolution of tachycardia and improvement in symptoms as flu improves   Asthma/COPD exacerbation caused by flu -Patient reports chronic SOB -She has severe obstructive disease based on pulm note from 12/20 -she is on [place] and Breztri as an outpatient  -She is not on home O2 but does wear nocturnal BIPAP -She does not have fever or leukocytosis.  -Chest x-ray is not consistent with pneumonia -She was given a continuous neb treatment in the ED with some improvement.   -Nebulizers: scheduled Duoneb and prn albuterol -Solu-Medrol 60 mg IV BID -> Prednisone 40 mg PO daily -Continue Breztri, Singulair, Yupelri -Continue BIPAP qhs  Bipolar d/o -Continue Belsomra, hydroxyzine  HTN -Continue HCTZ, irbesartan  HLD -Continue atorvastatin  Prediabetes -Recent A1c was 6.5 -Not on home medications -Will monitor without treatment for now  Seizures -Continue carbamazepine, gabapentin       Advance Care Planning:   Code Status: Full Code - Code status was discussed with the patient and/or family at the time of admission.  The patient would want to receive full resuscitative measures at this time.   Consults: Nutrition, TOC team  DVT Prophylaxis: Lovenox  Family Communication: None present; I spoke with her daughter by telephone at the time of admission  Severity of Illness: The appropriate patient status for this patient is OBSERVATION. Observation status is judged  to be reasonable and necessary in order to provide the required intensity of service to ensure the patient's safety. The patient's presenting symptoms, physical exam findings, and initial radiographic and laboratory data in  the context of their medical condition is felt to place them at decreased risk for further clinical deterioration. Furthermore, it is anticipated that the patient will be medically stable for discharge from the hospital within 2 midnights of admission.   Author: Karmen Bongo, MD 01/11/2023 5:43 PM  For on call review [email or web address].

## 2023-01-12 DIAGNOSIS — K219 Gastro-esophageal reflux disease without esophagitis: Secondary | ICD-10-CM | POA: Diagnosis present

## 2023-01-12 DIAGNOSIS — F319 Bipolar disorder, unspecified: Secondary | ICD-10-CM

## 2023-01-12 DIAGNOSIS — G40909 Epilepsy, unspecified, not intractable, without status epilepticus: Secondary | ICD-10-CM | POA: Diagnosis not present

## 2023-01-12 DIAGNOSIS — Z882 Allergy status to sulfonamides status: Secondary | ICD-10-CM | POA: Diagnosis not present

## 2023-01-12 DIAGNOSIS — Z87891 Personal history of nicotine dependence: Secondary | ICD-10-CM | POA: Diagnosis not present

## 2023-01-12 DIAGNOSIS — Z803 Family history of malignant neoplasm of breast: Secondary | ICD-10-CM | POA: Diagnosis not present

## 2023-01-12 DIAGNOSIS — Z800 Family history of malignant neoplasm of digestive organs: Secondary | ICD-10-CM | POA: Diagnosis not present

## 2023-01-12 DIAGNOSIS — E7800 Pure hypercholesterolemia, unspecified: Secondary | ICD-10-CM

## 2023-01-12 DIAGNOSIS — M797 Fibromyalgia: Secondary | ICD-10-CM | POA: Diagnosis present

## 2023-01-12 DIAGNOSIS — Z881 Allergy status to other antibiotic agents status: Secondary | ICD-10-CM | POA: Diagnosis not present

## 2023-01-12 DIAGNOSIS — R7303 Prediabetes: Secondary | ICD-10-CM | POA: Diagnosis present

## 2023-01-12 DIAGNOSIS — J45901 Unspecified asthma with (acute) exacerbation: Secondary | ICD-10-CM | POA: Diagnosis present

## 2023-01-12 DIAGNOSIS — I10 Essential (primary) hypertension: Secondary | ICD-10-CM | POA: Diagnosis present

## 2023-01-12 DIAGNOSIS — E876 Hypokalemia: Secondary | ICD-10-CM | POA: Diagnosis present

## 2023-01-12 DIAGNOSIS — J101 Influenza due to other identified influenza virus with other respiratory manifestations: Secondary | ICD-10-CM | POA: Diagnosis present

## 2023-01-12 DIAGNOSIS — D649 Anemia, unspecified: Secondary | ICD-10-CM | POA: Diagnosis present

## 2023-01-12 DIAGNOSIS — J441 Chronic obstructive pulmonary disease with (acute) exacerbation: Secondary | ICD-10-CM | POA: Diagnosis present

## 2023-01-12 DIAGNOSIS — Z8701 Personal history of pneumonia (recurrent): Secondary | ICD-10-CM | POA: Diagnosis not present

## 2023-01-12 DIAGNOSIS — I739 Peripheral vascular disease, unspecified: Secondary | ICD-10-CM | POA: Diagnosis present

## 2023-01-12 DIAGNOSIS — Z888 Allergy status to other drugs, medicaments and biological substances status: Secondary | ICD-10-CM | POA: Diagnosis not present

## 2023-01-12 DIAGNOSIS — Z79899 Other long term (current) drug therapy: Secondary | ICD-10-CM | POA: Diagnosis not present

## 2023-01-12 DIAGNOSIS — Z8249 Family history of ischemic heart disease and other diseases of the circulatory system: Secondary | ICD-10-CM | POA: Diagnosis not present

## 2023-01-12 DIAGNOSIS — Z1152 Encounter for screening for COVID-19: Secondary | ICD-10-CM | POA: Diagnosis not present

## 2023-01-12 DIAGNOSIS — E785 Hyperlipidemia, unspecified: Secondary | ICD-10-CM | POA: Diagnosis present

## 2023-01-12 DIAGNOSIS — J9601 Acute respiratory failure with hypoxia: Secondary | ICD-10-CM | POA: Diagnosis not present

## 2023-01-12 MED ORDER — METHYLPREDNISOLONE SODIUM SUCC 125 MG IJ SOLR
120.0000 mg | Freq: Every day | INTRAMUSCULAR | Status: DC
  Administered 2023-01-12 – 2023-01-14 (×3): 120 mg via INTRAVENOUS
  Filled 2023-01-12 (×3): qty 2

## 2023-01-12 NOTE — Progress Notes (Signed)
[facility]                                                                               [name], is a 62 y.o. female, DOB - Feb 07, 1961, JKD:[id number] Admit date - 01/11/2023    Outpatient Primary MD for the patient is Biagio Borg, MD  LOS - 0  days    Brief summary     [name] is a 62 y.o. 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   She has a h/o asthma/COPD overlap syndrome and has noticed feeling SOB when trying to get to the bathroom in her house for about a month now.     Assessment & Plan    Assessment and Plan:  Acute respiratory failure with hypoxia secondary to asthma exacerbation and influenza A  Continue with tamiflu and IV steroids, dulera, incruse ellipta. [place] oxygen to keep sats greater than 90% , currently on 2 lit of [place] oxygen.    Bipolar disorder Resume home meds.     Asthma/ copd: BIPAP at night    Hypertension:  Well controlled.    H/o hyperlipidemia;  Resume lipitor.    H/o seizures:  Continue with tegretol.   -   Estimated body mass index is 32.24 kg/m as calculated from the following:   Height as of this encounter: '5\' 3"'$  (1.6 m).   Weight as of this encounter: 82.6 kg.  Code Status: full code DVT Prophylaxis:  enoxaparin (LOVENOX) injection 40 mg Start: 01/11/23 1800   Level of Care: Level of care: Med-Surg Family Communication: none at bedside.   Disposition Plan:     Remains inpatient appropriate:  IV steroids.   Procedures:  None.   Consultants:   None.   Antimicrobials:   Anti-infectives (From admission, onward)    Start     Dose/Rate Route Frequency Ordered Stop   01/11/23 2200  oseltamivir (TAMIFLU) capsule 75 mg        75 mg Oral 2 times daily 01/11/23 1111 01/16/23 2159   01/11/23 1015  oseltamivir (TAMIFLU) capsule 75 mg        75 mg Oral   Once 01/11/23 1001 01/11/23 1243        Medications  Scheduled Meds:  atorvastatin  40 mg Oral Daily   carbamazepine  400 mg Oral Daily   carbamazepine  600 mg Oral QHS   docusate sodium  100 mg Oral BID   enoxaparin (LOVENOX) injection  40 mg Subcutaneous Q24H   famotidine  20 mg Oral Daily   gabapentin  100 mg Oral TID   hydrochlorothiazide  12.5 mg Oral Daily   ipratropium-albuterol  3 mL Nebulization Q6H   irbesartan  75 mg Oral Daily   ketotifen  1 drop Both Eyes BID   methylPREDNISolone (SOLU-MEDROL) injection  120 mg Intravenous Daily   mometasone-formoterol  2 puff Inhalation BID   montelukast  10 mg Oral QHS   oseltamivir  75 mg Oral BID   pantoprazole  40 mg Oral Daily   potassium chloride  10 mEq Oral Daily  revefenacin  175 mcg Nebulization Daily   sodium chloride flush  3 mL Intravenous Q12H   umeclidinium bromide  1 puff Inhalation Daily   Continuous Infusions: PRN Meds:.acetaminophen **OR** acetaminophen, albuterol, bisacodyl, cyclobenzaprine, guaiFENesin, hydrALAZINE, hydrOXYzine, morphine injection, ondansetron **OR** ondansetron (ZOFRAN) IV, oxyCODONE, polyethylene glycol, zolpidem    Subjective:   [name] was seen and examined today.  Reports feeling sob and wheezing.   Objective:   Vitals:   01/11/23 2200 01/12/23 0453 01/12/23 0810 01/12/23 1521  BP:  (!) 130/95    Pulse: 99     Resp: (!) 22 20    Temp:  98 F (36.7 C)    TempSrc:  Oral    SpO2: 94% 93% 98% 98%  Weight:      Height:        Intake/Output Summary (Last 24 hours) at 01/12/2023 1551 Last data filed at 01/12/2023 1914 Gross per 24 hour  Intake 545.33 ml  Output --  Net 545.33 ml   Filed Weights   01/11/23 0547  Weight: 82.6 kg     Exam General: Alert and oriented x 3, NAD Cardiovascular: S1 S2 auscultated, no murmurs, RRR Respiratory: bilateral exp wheezing, diminished air entry. Tachypnea.  Gastrointestinal: Soft, nontender, nondistended, + bowel  sounds Ext: no pedal edema bilaterally Neuro: AAOx3, Cr N's II- XII. Strength 5/5 upper and lower extremities bilaterally Skin: No rashes Psych: Normal affect and demeanor, alert and oriented x3    Data Reviewed:  I have personally reviewed following labs and imaging studies   CBC Lab Results  Component Value Date   WBC 9.6 01/11/2023   RBC 3.71 (L) 01/11/2023   HGB 11.6 (L) 01/11/2023   HCT 34.0 (L) 01/11/2023   MCV 88.9 01/11/2023   MCH 29.9 01/11/2023   PLT 284 01/11/2023   MCHC 33.6 01/11/2023   RDW 14.9 01/11/2023   LYMPHSABS 1.6 01/11/2023   MONOABS 1.2 (H) 01/11/2023   EOSABS 0.1 01/11/2023   BASOSABS 0.0 78/29/5621     Last metabolic panel Lab Results  Component Value Date   NA 138 01/11/2023   K 4.1 01/11/2023   CL 98 01/11/2023   CO2 28 01/11/2023   BUN 7 01/11/2023   CREATININE 0.70 01/11/2023   GLUCOSE 145 (H) 01/11/2023   GFRNONAA >60 01/11/2023   GFRAA >60 07/19/2020   CALCIUM 8.7 (L) 01/11/2023   PHOS 3.4 06/11/2020   PROT 6.1 (L) 10/08/2022   ALBUMIN 2.9 (L) 10/08/2022   LABGLOB 2.2 07/31/2019   AGRATIO 2.0 07/31/2019   BILITOT <0.1 (L) 10/08/2022   ALKPHOS 73 10/08/2022   AST 23 10/08/2022   ALT 21 10/08/2022   ANIONGAP 12 01/11/2023    CBG (last 3)  No results for input(s): "GLUCAP" in the last 72 hours.    Coagulation Profile: No results for input(s): "INR", "PROTIME" in the last 168 hours.   Radiology Studies: DG Chest Portable 1 View  Result Date: 01/11/2023 CLINICAL DATA:  62 year old female with shortness of breath. EXAM: PORTABLE CHEST 1 VIEW COMPARISON:  Chest CTA 10/08/2022 and earlier. FINDINGS: Portable AP semi upright view at 0601 hours. Chronic cervical ACDF partially visible. Lower lung volumes. Mediastinal contours are stable and within normal limits. Allowing for portable technique the lungs are clear. No pneumothorax or pleural effusion. Centrilobular emphysema demonstrated by CT last year. Paucity of bowel gas the  visible abdomen. No acute osseous abnormality identified. IMPRESSION: Emphysema (ICD10-J43.9). Lower lung volumes. No acute cardiopulmonary abnormality. Electronically Signed   By: Lemmie Evens  [place] Crane M.D.   On: 01/11/2023 06:12       Hosie Poisson M.D. [facility] 01/12/2023, 3:51 PM  Available via Epic secure chat 7am-7pm After 7 pm, please refer to night coverage provider listed on amion.

## 2023-01-12 NOTE — Progress Notes (Signed)
SATURATION QUALIFICATIONS: (This note is used to comply with regulatory documentation for home oxygen)  Patient Saturations on Room Air at Rest = 99%  Patient Saturations on Room Air while Ambulating = 85%  Patient Saturations on 2 Liters of oxygen while Ambulating = 99%  Please briefly explain why patient needs home oxygen:

## 2023-01-13 DIAGNOSIS — J45901 Unspecified asthma with (acute) exacerbation: Secondary | ICD-10-CM | POA: Diagnosis not present

## 2023-01-13 DIAGNOSIS — I10 Essential (primary) hypertension: Secondary | ICD-10-CM | POA: Diagnosis not present

## 2023-01-13 DIAGNOSIS — G40909 Epilepsy, unspecified, not intractable, without status epilepticus: Secondary | ICD-10-CM | POA: Diagnosis not present

## 2023-01-13 DIAGNOSIS — J101 Influenza due to other identified influenza virus with other respiratory manifestations: Secondary | ICD-10-CM | POA: Diagnosis not present

## 2023-01-13 LAB — CBC WITH DIFFERENTIAL/PLATELET
Abs Immature Granulocytes: 0.09 10*3/uL — ABNORMAL HIGH (ref 0.00–0.07)
Basophils Absolute: 0 10*3/uL (ref 0.0–0.1)
Basophils Relative: 0 %
Eosinophils Absolute: 0 10*3/uL (ref 0.0–0.5)
Eosinophils Relative: 0 %
HCT: 35.7 % — ABNORMAL LOW (ref 36.0–46.0)
Hemoglobin: 11.6 g/dL — ABNORMAL LOW (ref 12.0–15.0)
Immature Granulocytes: 1 %
Lymphocytes Relative: 13 %
Lymphs Abs: 1.1 10*3/uL (ref 0.7–4.0)
MCH: 29.7 pg (ref 26.0–34.0)
MCHC: 32.5 g/dL (ref 30.0–36.0)
MCV: 91.3 fL (ref 80.0–100.0)
Monocytes Absolute: 0.7 10*3/uL (ref 0.1–1.0)
Monocytes Relative: 8 %
Neutro Abs: 6.8 10*3/uL (ref 1.7–7.7)
Neutrophils Relative %: 78 %
Platelets: 310 10*3/uL (ref 150–400)
RBC: 3.91 MIL/uL (ref 3.87–5.11)
RDW: 15.2 % (ref 11.5–15.5)
WBC: 8.6 10*3/uL (ref 4.0–10.5)
nRBC: 0 % (ref 0.0–0.2)

## 2023-01-13 LAB — BASIC METABOLIC PANEL
Anion gap: 9 (ref 5–15)
BUN: 26 mg/dL — ABNORMAL HIGH (ref 6–20)
CO2: 27 mmol/L (ref 22–32)
Calcium: 9.1 mg/dL (ref 8.9–10.3)
Chloride: 100 mmol/L (ref 98–111)
Creatinine, Ser: 0.78 mg/dL (ref 0.44–1.00)
GFR, Estimated: >60 mL/min (ref >60)
Glucose, Bld: 117 mg/dL — ABNORMAL HIGH (ref 70–99)
Potassium: 4 mmol/L (ref 3.5–5.1)
Sodium: 136 mmol/L (ref 135–145)

## 2023-01-13 NOTE — Progress Notes (Signed)
[facility]                                                                               [name], is a 62 y.o. female, DOB - 1961-09-04, OYD:[id number] Admit date - 01/11/2023    Outpatient Primary MD for the patient is Biagio Borg, MD  LOS - 1  days    Brief summary     [name] is a 62 y.o. 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   She has a h/o asthma/COPD overlap syndrome and has noticed feeling SOB when trying to get to the bathroom in her house for about a month now.     Assessment & Plan    Assessment and Plan:  Acute respiratory failure with hypoxia secondary to asthma exacerbation and influenza A  Continue with tamiflu and IV steroids, dulera, incruse ellipta. [place] oxygen to keep sats greater than 90% ,  Wean her off oxygen in the next 24 hours Recommend continue IV Solu-Medrol for another 24 hours before transitioning to oral prednisone Incentive spirometer and flutter valve.   Bipolar disorder Resume home meds.     Asthma/ copd: BIPAP at night    Hypertension:  Blood pressure parameters are optimal   H/o hyperlipidemia;  Resume lipitor.    H/o seizures:  Continue with tegretol.    Hypokalemia Replaced    Mild normocytic anemia hemoglobin stable around 11     -   Estimated body mass index is 32.24 kg/m as calculated from the following:   Height as of this encounter: '5\' 3"'$  (1.6 m).   Weight as of this encounter: 82.6 kg.  Code Status: full code DVT Prophylaxis:  enoxaparin (LOVENOX) injection 40 mg Start: 01/11/23 1800   Level of Care: Level of care: Med-Surg Family Communication: none at bedside.   Disposition Plan:     Remains inpatient appropriate:  IV steroids.   Procedures:  None.   Consultants:   None.   Antimicrobials:   Anti-infectives (From  admission, onward)    Start     Dose/Rate Route Frequency Ordered Stop   01/11/23 2200  oseltamivir (TAMIFLU) capsule 75 mg        75 mg Oral 2 times daily 01/11/23 1111 01/16/23 2159   01/11/23 1015  oseltamivir (TAMIFLU) capsule 75 mg        75 mg Oral  Once 01/11/23 1001 01/11/23 1243        Medications  Scheduled Meds:  atorvastatin  40 mg Oral Daily   carbamazepine  400 mg Oral Daily   carbamazepine  600 mg Oral QHS   docusate sodium  100 mg Oral BID   enoxaparin (LOVENOX) injection  40 mg Subcutaneous Q24H   famotidine  20 mg Oral Daily   gabapentin  100 mg Oral TID   ipratropium-albuterol  3 mL Nebulization Q6H   irbesartan  75 mg Oral Daily   ketotifen  1 drop Both Eyes BID   methylPREDNISolone (SOLU-MEDROL) injection  120 mg Intravenous Daily   mometasone-formoterol  2 puff Inhalation BID   montelukast  10 mg Oral QHS   oseltamivir  75 mg Oral BID   pantoprazole  40 mg Oral Daily   potassium chloride  10 mEq Oral Daily   revefenacin  175 mcg Nebulization Daily   sodium chloride flush  3 mL Intravenous Q12H   umeclidinium bromide  1 puff Inhalation Daily   Continuous Infusions: PRN Meds:.acetaminophen **OR** acetaminophen, albuterol, bisacodyl, cyclobenzaprine, guaiFENesin, hydrALAZINE, hydrOXYzine, morphine injection, ondansetron **OR** ondansetron (ZOFRAN) IV, oxyCODONE, polyethylene glycol, zolpidem    Subjective:   [name] was seen and examined today.  Patient reports her breathing and wheezing have improved.  Objective:   Vitals:   01/13/23 0522 01/13/23 0754 01/13/23 0849 01/13/23 1457  BP: 138/74  118/64   Pulse:   89   Resp: 18  18   Temp:   98 F (36.7 C)   TempSrc:   Oral   SpO2:  95% 95% 94%  Weight:      Height:       No intake or output data in the 24 hours ending 01/13/23 1634  Filed Weights   01/11/23 0547  Weight: 82.6 kg     Exam General exam: Appears calm and comfortable  Respiratory system: Bilateral expiratory  wheezing, air entry fair, tachypnea present Cardiovascular system: S1 & S2 heard, RRR.  Gastrointestinal system: Abdomen is nondistended, soft and nontender.  Central nervous system: Alert and oriented. No focal neurological deficits. Extremities: Symmetric 5 x 5 power. Skin: No rashes, lesions or ulcers Psychiatry: Mood is appropriate   Data Reviewed:  I have personally reviewed following labs and imaging studies   CBC Lab Results  Component Value Date   WBC 8.6 01/13/2023   RBC 3.91 01/13/2023   HGB 11.6 (L) 01/13/2023   HCT 35.7 (L) 01/13/2023   MCV 91.3 01/13/2023   MCH 29.7 01/13/2023   PLT 310 01/13/2023   MCHC 32.5 01/13/2023   RDW 15.2 01/13/2023   LYMPHSABS 1.1 01/13/2023   MONOABS 0.7 01/13/2023   EOSABS 0.0 01/13/2023   BASOSABS 0.0 15/17/6160     Last metabolic panel Lab Results  Component Value Date   NA 136 01/13/2023   K 4.0 01/13/2023   CL 100 01/13/2023   CO2 27 01/13/2023   BUN 26 (H) 01/13/2023   CREATININE 0.78 01/13/2023   GLUCOSE 117 (H) 01/13/2023   GFRNONAA >60 01/13/2023   GFRAA >60 07/19/2020   CALCIUM 9.1 01/13/2023   PHOS 3.4 06/11/2020   PROT 6.1 (L) 10/08/2022   ALBUMIN 2.9 (L) 10/08/2022   LABGLOB 2.2 07/31/2019   AGRATIO 2.0 07/31/2019   BILITOT <0.1 (L) 10/08/2022   ALKPHOS 73 10/08/2022   AST 23 10/08/2022   ALT 21 10/08/2022   ANIONGAP 9 01/13/2023    CBG (last 3)  No results for input(s): "GLUCAP" in the last 72 hours.    Coagulation Profile: No results for input(s): "INR", "PROTIME" in the last 168 hours.   Radiology Studies: No results found.     Hosie Poisson M.D. [facility] 01/13/2023, 4:34 PM  Available via Epic secure chat 7am-7pm After 7 pm, please refer to night coverage provider listed on amion.

## 2023-01-14 DIAGNOSIS — J45901 Unspecified asthma with (acute) exacerbation: Secondary | ICD-10-CM | POA: Diagnosis not present

## 2023-01-14 DIAGNOSIS — J101 Influenza due to other identified influenza virus with other respiratory manifestations: Secondary | ICD-10-CM | POA: Diagnosis not present

## 2023-01-14 DIAGNOSIS — I10 Essential (primary) hypertension: Secondary | ICD-10-CM | POA: Diagnosis not present

## 2023-01-14 DIAGNOSIS — G40909 Epilepsy, unspecified, not intractable, without status epilepticus: Secondary | ICD-10-CM | POA: Diagnosis not present

## 2023-01-14 MED ORDER — IPRATROPIUM-ALBUTEROL 0.5-2.5 (3) MG/3ML IN SOLN
3.0000 mL | Freq: Two times a day (BID) | RESPIRATORY_TRACT | Status: DC
  Administered 2023-01-15: 3 mL via RESPIRATORY_TRACT
  Filled 2023-01-14: qty 3

## 2023-01-14 MED ORDER — METHYLPREDNISOLONE SODIUM SUCC 40 MG IJ SOLR
40.0000 mg | Freq: Two times a day (BID) | INTRAMUSCULAR | Status: DC
  Administered 2023-01-14 – 2023-01-15 (×2): 40 mg via INTRAVENOUS
  Filled 2023-01-14 (×2): qty 1

## 2023-01-14 MED ORDER — SODIUM CHLORIDE 3 % IN NEBU
4.0000 mL | INHALATION_SOLUTION | Freq: Once | RESPIRATORY_TRACT | Status: AC
  Administered 2023-01-14: 4 mL via RESPIRATORY_TRACT
  Filled 2023-01-14: qty 4

## 2023-01-14 MED ORDER — GUAIFENESIN-DM 100-10 MG/5ML PO SYRP
10.0000 mL | ORAL_SOLUTION | ORAL | Status: DC | PRN
  Administered 2023-01-14: 10 mL via ORAL
  Filled 2023-01-14: qty 10

## 2023-01-14 NOTE — Progress Notes (Signed)
Patient refused CPAP for the night.  °

## 2023-01-14 NOTE — Evaluation (Signed)
Physical Therapy Evaluation Patient Details Name: [name] MRN: [id number] DOB: 08/15/1961 Today's Date: 01/14/2023  History of Present Illness  [name] is a 62 y.o. female presenting with respiratory distress.   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 Lives at home alone, in a single-level home with a level entry; Prior to admission, pt was managing independently, no need for assistive device, driving; Presents to PT with decr functional capacity;  Overall supervision for mobility and amb; Walked on room air and O2 sats decr to 86% briefly; recovered to 91% with deep breathing without the need for supplemental O2; Once in recliner, noted incr wheezing, and pt generally with difficulty relaxing; restarted supplemental O2 for comfort, 1L, and sats incr to 96%; Pt currently with functional limitations due to the deficits listed below (see PT Problem List). Pt will benefit from skilled PT to increase their independence and safety with mobility to allow discharge to the venue listed below.       Will plan for O2 qualifying walk next session     Recommendations for follow up therapy are one component of a multi-disciplinary discharge planning process, led by the attending physician.  Recommendations may be updated based on patient status, additional functional criteria and insurance authorization.  Follow Up Recommendations No PT follow up      Assistance Recommended at Discharge PRN  Patient can return home with the following  Assistance with cooking/housework     Equipment Recommendations None recommended by PT (Will consider supplemental O2; will do O2 qualifying walk next session)  Recommendations for Other Services       Functional Status Assessment Patient has had a recent decline in their functional status and demonstrates the ability to make significant improvements in function in a reasonable and predictable amount of time.     Precautions / Restrictions Precautions Precautions: Other (comment) Precaution Comments: Droplet      Mobility  Bed Mobility Overal bed mobility: Independent                  Transfers Overall transfer level: Independent Equipment used: None                    Ambulation/Gait Ambulation/Gait assistance: Supervision Gait Distance (Feet): 25 Feet Assistive device: None Gait Pattern/deviations: Step-through pattern       General Gait Details: Cues to self-monitor for activity tolerance   Stairs            Wheelchair Mobility    Modified Rankin (Stroke Patients Only)       Balance Overall balance assessment: Mild deficits observed, not formally tested                                           Pertinent Vitals/Pain Pain Assessment Pain Assessment: Faces Faces Pain Scale: Hurts a little bit Pain Location: Generalized malaise Pain Descriptors / Indicators: Aching Pain Intervention(s): Monitored during session    Home Living Family/patient expects to be discharged to:: Private residence Living  Arrangements: Alone Available Help at Discharge: Family;Available PRN/intermittently Type of Home: Apartment Home Access: Level entry       Home Layout: One level Home Equipment: Conservation officer, nature (2 wheels);Kasandra Knudsen - single point Additional Comments: reports two daughters, 58 yo grandson and 32 yo granddaughter can (A) PRN.    Prior Function Prior Level of Function : Independent/Modified Independent;History of Falls (last six months);Driving              Mobility Comments: uses RW and cane as needed ADLs Comments: independent ADLs, IADLs limited (daughter assists), driving     Hand Dominance   Dominant Hand: Right    Extremity/Trunk Assessment   Upper Extremity Assessment Upper Extremity Assessment: Overall WFL for tasks assessed    Lower Extremity Assessment Lower Extremity Assessment: Generalized weakness       Communication   Communication: No difficulties  Cognition Arousal/Alertness: Awake/alert Behavior During Therapy: WFL for tasks assessed/performed Overall Cognitive Status: Within Functional Limits for tasks assessed                                          General Comments General comments (skin integrity, edema, etc.): Walked on room air and O2 sats decr to 86% briefly; recovered to 91% with deep breathing without the need for supplemental O2; Once in recliner, noted incr wheezing, and pt generally with difficulty relaxing; restarted supplemental O2 for comfort, 1L, and sats incr to 96%    Exercises     Assessment/Plan    PT Assessment Patient needs continued PT services  PT Problem List Decreased activity tolerance;Decreased knowledge of precautions;Cardiopulmonary status limiting activity       PT Treatment Interventions DME instruction;Gait training;Stair training;Functional mobility training;Therapeutic activities;Therapeutic exercise;Patient/family education    PT Goals (Current goals can be found in the Care Plan section)  Acute Rehab PT Goals Patient Stated Goal: breathe better PT Goal Formulation: With patient Time For Goal Achievement: 01/21/23 Potential to Achieve Goals: Good    Frequency Min 3X/week     Co-evaluation               AM-PAC PT "6 Clicks" Mobility  Outcome Measure Help needed turning from your back to your side while in a flat bed without using bedrails?: None Help needed moving from lying on your back to sitting on the side of a flat bed without  using bedrails?: None Help needed moving to and from a bed to a chair (including a wheelchair)?: None Help needed standing up from a chair using your arms (e.g., wheelchair or bedside chair)?: None Help needed to walk in hospital room?: A Little Help needed climbing 3-5 steps with a railing? : A Little 6 Click Score: 22    End of Session Equipment Utilized During Treatment: Gait belt Activity Tolerance: Patient tolerated treatment well Patient left: in chair;with call bell/phone within reach;with nursing/sitter in room Nurse Communication: Mobility status PT Visit Diagnosis: Muscle weakness (generalized) (M62.81) (decr functional capacity)    Time: 8466-5993 PT Time Calculation (min) (ACUTE ONLY): 26 min   Charges:   PT Evaluation $PT Eval Low Complexity: 1 Low PT Treatments $Gait Training: 8-22 mins        Roney Marion, PT  Acute Rehabilitation Services Office [phone]   Colletta [place] 01/14/2023, 2:30 PM

## 2023-01-14 NOTE — Progress Notes (Signed)
Placed patient on CPAP for the night via auto-mode with oxygen set at 1lpm

## 2023-01-14 NOTE — Progress Notes (Signed)
[facility]                                                                               [name], is a 62 y.o. female, DOB - [birth year], ION:[id number] Admit date - 01/11/2023    Outpatient Primary MD for the patient is Biagio Borg, MD  LOS - 2  days    Brief summary     [name] is a 62 y.o. 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   She has a h/o asthma/COPD overlap syndrome and has noticed feeling SOB when trying to get to the bathroom in her house for about a month now.     Assessment & Plan    Assessment and Plan:  Acute respiratory failure with hypoxia secondary to asthma exacerbation and influenza A  Continue with tamiflu and IV steroids, dulera, incruse ellipta. Wheezing has improved. Start tapering steroids. , decreased the IV steroids to 40 mg IV Q12.  [place] oxygen to keep sats greater than 90% ,  Weaned her off oxygen  Incentive spirometer and flutter valve.   Bipolar disorder Resume home meds.     Asthma/ copd: BIPAP at night    Hypertension:  Blood pressure parameters are optimal   H/o hyperlipidemia;  Resume lipitor.    H/o seizures:  Continue with tegretol.    Hypokalemia Replaced, repeat level wnl.     Mild normocytic anemia hemoglobin stable around 11   Estimated body mass index is 32.24 kg/m as calculated from the following:   Height as of this encounter: '5\' 3"'$  (1.6 m).   Weight as of this encounter: 82.6 kg.  Code Status: full code DVT Prophylaxis:  enoxaparin (LOVENOX) injection 40 mg Start: 01/11/23 1800   Level of Care: Level of care: Med-Surg Family Communication: none at bedside.   Disposition Plan:     Remains inpatient appropriate:  IV steroids.   Procedures:  None.   Consultants:   None.   Antimicrobials:   Anti-infectives (From admission,  onward)    Start     Dose/Rate Route Frequency Ordered Stop   01/11/23 2200  oseltamivir (TAMIFLU) capsule 75 mg        75 mg Oral 2 times daily 01/11/23 1111 01/16/23 2159   01/11/23 1015  oseltamivir (TAMIFLU) capsule 75 mg        75 mg Oral  Once 01/11/23 1001 01/11/23 1243        Medications  Scheduled Meds:  atorvastatin  40 mg Oral Daily   carbamazepine  400 mg Oral Daily   carbamazepine  600 mg Oral QHS   docusate sodium  100 mg Oral BID   enoxaparin (LOVENOX) injection  40 mg Subcutaneous Q24H   famotidine  20 mg Oral Daily   gabapentin  100 mg Oral TID   ipratropium-albuterol  3 mL Nebulization Q6H   irbesartan  75 mg Oral Daily   ketotifen  1 drop Both Eyes BID   methylPREDNISolone (SOLU-MEDROL) injection  40 mg Intravenous Q12H   mometasone-formoterol  2 puff Inhalation BID   montelukast  10 mg Oral QHS   oseltamivir  75 mg Oral BID   pantoprazole  40 mg Oral Daily   potassium chloride  10 mEq Oral Daily   revefenacin  175 mcg Nebulization Daily   sodium chloride flush  3 mL Intravenous Q12H   umeclidinium bromide  1 puff Inhalation Daily   Continuous Infusions: PRN Meds:.acetaminophen **OR** acetaminophen, albuterol, bisacodyl, cyclobenzaprine, guaiFENesin-dextromethorphan, hydrALAZINE, hydrOXYzine, morphine injection, ondansetron **OR** ondansetron (ZOFRAN) IV, oxyCODONE, polyethylene glycol, zolpidem    Subjective:   [name] was seen and examined today. Still wheezing , coughing better,    Objective:   Vitals:   01/13/23 1736 01/13/23 2000 01/13/23 2021 01/14/23 0117  BP: 123/73 137/81    Pulse: 92 80    Resp: 15 16    Temp: 98.2 F (36.8 C) 98.7 F (37.1 C)    TempSrc: Oral Oral    SpO2: 94% 92% 98% 93%  Weight:      Height:        Intake/Output Summary (Last 24 hours) at 01/14/2023 1435 Last data filed at 01/14/2023 0846 Gross per 24 hour  Intake 366 ml  Output --  Net 366 ml    Filed Weights   01/11/23 0547  Weight: 82.6  kg     Exam General exam: Appears calm and comfortable  Respiratory system: on RA, but diffusely wheezing. Air entry fair.  Cardiovascular system: S1 & S2 heard, RRR. No JVD,  Gastrointestinal system: Abdomen is nondistended, soft and nontender.  Central nervous system: Alert and oriented. No focal neurological deficits. Extremities: Symmetric 5 x 5 power. Skin: No rashes,  Psychiatry: Mood & affect appropriate.    Data Reviewed:  I have personally reviewed following labs and imaging studies   CBC Lab Results  Component Value Date   WBC 8.6 01/13/2023   RBC 3.91 01/13/2023   HGB 11.6 (L) 01/13/2023   HCT 35.7 (L) 01/13/2023   MCV 91.3 01/13/2023   MCH 29.7 01/13/2023   PLT 310 01/13/2023   MCHC 32.5 01/13/2023   RDW 15.2 01/13/2023   LYMPHSABS 1.1 01/13/2023   MONOABS 0.7 01/13/2023   EOSABS 0.0 01/13/2023   BASOSABS 0.0 25/04/3975     Last metabolic panel Lab Results  Component Value Date   NA 136 01/13/2023   K 4.0 01/13/2023   CL 100 01/13/2023   CO2 27 01/13/2023   BUN 26 (H) 01/13/2023   CREATININE 0.78 01/13/2023   GLUCOSE 117 (H) 01/13/2023   GFRNONAA >60 01/13/2023   GFRAA >60 07/19/2020   CALCIUM 9.1 01/13/2023   PHOS 3.4 06/11/2020   PROT 6.1 (L) 10/08/2022   ALBUMIN 2.9 (L) 10/08/2022   LABGLOB 2.2 07/31/2019   AGRATIO 2.0 07/31/2019   BILITOT <0.1 (L) 10/08/2022   ALKPHOS 73 10/08/2022   AST 23 10/08/2022   ALT 21 10/08/2022   ANIONGAP 9 01/13/2023    CBG (last 3)  No results for input(s): "GLUCAP" in the last 72 hours.    Coagulation Profile: No results for input(s): "INR", "PROTIME" in the last 168 hours.   Radiology Studies: No results found.     Hosie Poisson M.D. [facility] 01/14/2023, 2:35 PM  Available via Epic secure chat 7am-7pm After 7 pm, please refer to night coverage provider listed on amion.

## 2023-01-15 DIAGNOSIS — G40909 Epilepsy, unspecified, not intractable, without status epilepticus: Secondary | ICD-10-CM | POA: Diagnosis not present

## 2023-01-15 DIAGNOSIS — J101 Influenza due to other identified influenza virus with other respiratory manifestations: Secondary | ICD-10-CM | POA: Diagnosis not present

## 2023-01-15 DIAGNOSIS — I10 Essential (primary) hypertension: Secondary | ICD-10-CM | POA: Diagnosis not present

## 2023-01-15 DIAGNOSIS — J45901 Unspecified asthma with (acute) exacerbation: Secondary | ICD-10-CM | POA: Diagnosis not present

## 2023-01-15 MED ORDER — IPRATROPIUM-ALBUTEROL 0.5-2.5 (3) MG/3ML IN SOLN: 3.0000 mL | Freq: Two times a day (BID) | RESPIRATORY_TRACT | 3 refills | Status: DC

## 2023-01-15 MED ORDER — OSELTAMIVIR PHOSPHATE 75 MG PO CAPS: 75.0000 mg | ORAL_CAPSULE | Freq: Two times a day (BID) | ORAL | 0 refills | Status: AC

## 2023-01-15 MED ORDER — GUAIFENESIN-DM 100-10 MG/5ML PO SYRP: 10.0000 mL | ORAL_SOLUTION | ORAL | 0 refills | Status: DC | PRN

## 2023-01-15 MED ORDER — AZITHROMYCIN 250 MG PO TABS: ORAL_TABLET | ORAL | 0 refills | Status: AC

## 2023-01-15 MED ORDER — PREDNISONE 10 MG PO TABS: ORAL_TABLET | ORAL | 0 refills | Status: DC

## 2023-01-15 NOTE — Progress Notes (Signed)
Physical Therapy Treatment Patient Details Name: [name] MRN: [id number] DOB: 1961/02/17 Today's Date: 01/15/2023   History of Present Illness [name] is a 62 y.o. female presenting with respiratory distress.   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 Pt ambulating 400 ft with no assistive device modI. Brief desat to 86% on RA; rebounded to 90% with 45s seated rest break. Education provided regarding energy conservation techniques and activity recommendations/progression. [facility] No further acute PT needs. Thank you for this consult.    Recommendations for follow up therapy are one component of a multi-disciplinary discharge planning process, led by the attending physician.  Recommendations may be updated based on patient status, additional functional criteria and insurance authorization.  Follow Up Recommendations  No PT follow up     Assistance Recommended at Discharge PRN  Patient can return home with the following Assistance with cooking/housework   Equipment Recommendations  None recommended by PT    Recommendations for Other Services       Precautions / Restrictions Precautions Precautions: Other (comment) Precaution Comments: Droplet Restrictions Weight Bearing Restrictions: No     Mobility  Bed Mobility Overal bed mobility: Independent             General bed mobility comments: OOB in chair     Transfers Overall transfer level: Independent Equipment used: None                    Ambulation/Gait Ambulation/Gait assistance: Modified independent (Device/Increase time) Gait Distance (Feet): 400 Feet Assistive device: None Gait Pattern/deviations: WFL(Within Functional Limits)       General Gait Details: Slower pace, no gross abnormalities or imbalance noted   Stairs             Wheelchair Mobility    Modified Rankin (Stroke Patients Only)       Balance Overall balance assessment: No apparent balance deficits (not formally assessed)                                          Cognition Arousal/Alertness: Awake/alert Behavior During Therapy: WFL for tasks assessed/performed Overall Cognitive Status: Within Functional Limits for tasks assessed                                          Exercises      General Comments        Pertinent Vitals/Pain Pain Assessment Pain Assessment: Faces Faces Pain Scale: No hurt Pain Location: Generalized malaise Pain Descriptors / Indicators: Aching    Home Living                          Prior Function            PT Goals (current goals  can now be found in the care plan section) Acute Rehab PT Goals Patient Stated Goal: breathe better PT Goal Formulation: With patient Time For Goal Achievement: 01/21/23 Potential to Achieve Goals: Good Progress towards PT goals: Goals met/education completed, patient discharged from PT    Frequency    Min 3X/week      PT Plan Current plan remains appropriate    Co-evaluation              AM-PAC PT "6 Clicks" Mobility   Outcome Measure  Help needed turning from your back to your side while in a flat bed without using bedrails?: None Help needed moving from lying on your back to sitting on the side of a flat bed without using bedrails?: None Help needed moving to and from a bed to a chair (including a  wheelchair)?: None Help needed standing up from a chair using your arms (e.g., wheelchair or bedside chair)?: None Help needed to walk in hospital room?: None Help needed climbing 3-5 steps with a railing? : None 6 Click Score: 24    End of Session Equipment Utilized During Treatment: Gait belt Activity Tolerance: Patient tolerated treatment well Patient left: in chair;with call bell/phone within reach;with nursing/sitter in room Nurse Communication: Mobility status PT Visit Diagnosis: Muscle weakness (generalized) (M62.81)     Time: 1048-1100 PT Time Calculation (min) (ACUTE ONLY): 12 min  Charges:  $Therapeutic Activity: 8-22 mins                     Wyona Almas, PT, DPT Acute Rehabilitation Services Office [phone]    Deno Etienne 01/15/2023, 2:47 PM

## 2023-01-15 NOTE — Progress Notes (Signed)
1/30 IMM Letter given to patient by nurse in which I witnessed.

## 2023-01-15 NOTE — Progress Notes (Signed)
Discharge instructions are given to the patient. All questions answered.

## 2023-01-16 ENCOUNTER — Ambulatory Visit: Payer: Medicare HMO

## 2023-01-16 ENCOUNTER — Telehealth: Payer: Self-pay | Admitting: *Deleted

## 2023-01-16 ENCOUNTER — Encounter: Payer: Self-pay | Admitting: *Deleted

## 2023-01-16 NOTE — Patient Outreach (Addendum)
Care Coordination TOC Note Transition Care Management Follow-up Telephone Call Date of discharge and from where: [facility]/15/23[facility]; influenza A- COPD/ asthma exacerbation How have you been since you were released from the hospital? "Overall things are going okay; I was concerned a few minutes ago because my oxygen level dropped into the 80's, but I just checked it when you called and it is now back up to 96%.  I am taking all of the new medicines they gave me and using the nebulizer, but I do still have some wheezing.  I called this morning and got my appointments scheduled with the lung doctor and with Dr. Jenny Reichmann.... my neighbor is going to take me to those appointments.  I do have plenty of food to eat now-- I am on a program with [employer], so that is all worked out now.  Please have nurse Denton Brick call me tomorrow as scheduled; I want somebody calling to check on me after this hospital visit as often as possible since my oxygen level dropped earlier today... I just want to make sure I have support if it is available.  My daughter is coming over tonight when she gets off work" Any questions or concerns? No  Items Reviewed: Did the pt receive and understand the discharge instructions provided? Yes  Medications obtained and verified? Yes  confirmed patient obtained and is taking all hospital discharge newly prescribed medications; she declines full medication review stating she is lying down and has no questions or concerns around medications; confirms she self-manages her medications independently Other? No  Any new allergies since your discharge? No  Dietary orders reviewed? Yes Do you have support at home? Yes  patient lives alone; neighbors and her daughter are checking in her; reports she is independent in self-care activities  Home Care and Equipment/Supplies: Were home health services ordered? no If so, what is the name of the agency? N/A  Has the agency set up a time to come to the patient's  home? not applicable Were any new equipment or medical supplies ordered?  No What is the name of the medical supply agency? N/A Were you able to get the supplies/equipment? not applicable Do you have any questions related to the use of the equipment or supplies? No N/A  Functional Questionnaire: (I = Independent and D = Dependent) ADLs: I  Bathing/Dressing- I  Meal Prep- I  Eating- I  Maintaining continence- I  Transferring/Ambulation- I  Managing Meds- I  Follow up appointments reviewed:  PCP Hospital f/u appt confirmed? Yes  Scheduled to see PCP, Dr. Jenny Reichmann on 01/25/23 @ 11:00 am [facility] f/u appt confirmed? Yes  Scheduled to see pulmonary provider on Tuesday, 01/22/23 @ 3:00 pm Are transportation arrangements needed? No  If their condition worsens, is the pt aware to call PCP or go to the Emergency Dept.? Yes Was the patient provided with contact information for the PCP's office or ED? No- patient declined; reports she already has contact information for all care providers Was to pt encouraged to call back with questions or concerns? Yes  SDOH assessments and interventions completed:   Yes SDOH Interventions Today    Flowsheet Row Most Recent Value  SDOH Interventions   Food Insecurity Interventions Intervention Not Indicated  [Today, during TOC call patient denies food insecurity-- previous documentation indicated food insecurity,  patient says that is now resolved 01/16/23]  Transportation Interventions Intervention Not Indicated  [Patient reports that she has friends who provide transportation,  denies transportation needs  today during TOC call]      Interventions Today    Flowsheet Row Most Recent Value  Chronic Disease Discussed/Reviewed   Chronic disease discussed/reviewed during today's visit Chronic Obstructive Pulmonary Disease (COPD)  General Interventions   General Interventions Discussed/Reviewed General Interventions Discussed, Doctor Visits  Doctor  Visits Discussed/Reviewed Doctor Visits Discussed, Specialist, PCP  [facility] hospital follow up visits scheduled with PCP on 01/25/23 and with pulmonary provider on 01/22/23]  PCP/Specialist Visits Compliance with follow-up visit  Education Interventions   Education Provided Provided Verbal Education  Provided Verbal Education On When to see the doctor  Nutrition Interventions   Nutrition Discussed/Reviewed Nutrition Discussed  Pharmacy Interventions   Pharmacy Dicussed/Reviewed Pharmacy Topics Discussed  [confirmed patient obtained and is taking all newly prescribed medications post-recent hospital discharge on 01/15/23,  she declines full medication review today,  self-manages medications,  denies questions/ concerns around medications]      Care Coordination Interventions:  See interventions as above; care coordination outreach with RN CM already established in patient's care as FYI    Encounter Outcome:  Pt. Visit Completed    Oneta Rack, RN, BSN, CCRN Alumnus RN CM Care Coordination/ Transition of [place] Management [phone]: direct office

## 2023-01-17 ENCOUNTER — Ambulatory Visit: Payer: Self-pay

## 2023-01-17 ENCOUNTER — Ambulatory Visit (INDEPENDENT_AMBULATORY_CARE_PROVIDER_SITE_OTHER): Payer: Medicare HMO

## 2023-01-17 DIAGNOSIS — J4550 Severe persistent asthma, uncomplicated: Secondary | ICD-10-CM | POA: Diagnosis not present

## 2023-01-17 NOTE — Discharge Summary (Signed)
Physician Discharge Summary   Patient: [name] MRN: [id number] DOB: 1961/01/02  Admit date:     01/11/2023  Discharge date: 01/15/2023  Discharge Physician: Hosie Poisson   PCP: Biagio Borg, MD   Recommendations at discharge:   Please follow up with PCP in one week.  Please follow up with cbc and bmp in one week.   Discharge Diagnoses: Principal Problem:   Influenza A Active Problems:   Seizure disorder ([place])   Essential (primary) hypertension   Bipolar I disorder ([place])   Hyperlipidemia   Acute exacerbation of COPD with asthma ([place])   Acute asthma exacerbation   Hospital Course:   Jaelyn Bourgoin is a 62 y.o. 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   She has a h/o asthma/COPD overlap syndrome and has noticed feeling SOB when trying to get to the bathroom in her house for about a month now.    Assessment and Plan: Acute respiratory failure with hypoxia secondary to asthma exacerbation and influenza A  Continue with tamiflu and IV steroids, dulera, incruse ellipta. Transition to oral steroids on discharge.  Weaned her off oxygen  Incentive spirometer and flutter valve.     Bipolar disorder Resume home meds.        Asthma/ copd: BIPAP at night      Hypertension:  Blood pressure parameters are optimal     H/o hyperlipidemia;  Resume lipitor.      H/o seizures:  Continue with tegretol.      Hypokalemia Replaced, repeat level wnl.        Mild normocytic anemia hemoglobin stable around 11     Estimated body mass index is 32.24 kg/m as calculated from the following:   Height as of this encounter: '5\' 3"'$  (1.6 m).   Weight as of this encounter: 82.6 kg.     Consultants: none.  Procedures performed: none.   Disposition: Home Diet recommendation:  Discharge Diet Orders (From admission, onward)      Start     Ordered   01/15/23 0000  Diet - low sodium heart healthy        01/15/23 1059           Cardiac diet DISCHARGE MEDICATION: Allergies as of 01/15/2023       Reactions   Sulfa Antibiotics Nausea And Vomiting   Venofer [iron Sucrose] Hives        Medication List     TAKE these medications    albuterol 108 (90 Base) MCG/ACT inhaler Commonly known as: Ventolin HFA Inhale 2 puffs into the lungs every 6 (six) hours as needed for wheezing or shortness of breath.   atorvastatin 40 MG tablet Commonly known as: LIPITOR Take 1 tablet (40 mg total) by mouth daily. TAKE 1 TABLET(40 MG) BY MOUTH DAILY Strength: 40 mg What changed: additional instructions   azelastine 0.05 % ophthalmic solution Commonly known as: OPTIVAR Place 1 drop into both eyes 2 (two) times daily.   azithromycin 250 MG tablet Commonly known as: Zithromax Z-Pak Take 2 tablets (500 mg) on  Day 1,  followed by 1 tablet (250 mg) once daily on Days 2 through 5.   Belsomra 15 MG Tabs Generic drug: Suvorexant Take 15 mg by mouth at bedtime as needed (sleep).   Breztri Aerosphere 160-9-4.8 MCG/ACT Aero Generic drug: Budeson-Glycopyrrol-Formoterol Inhale 2 puffs into the lungs in the morning and at bedtime.  butalbital-acetaminophen-caffeine 50-325-40 MG tablet Commonly known as: FIORICET 1 tab by mouth once daily as needed for HA, limit 14 per month   carbamazepine 200 MG tablet Commonly known as: TEGretol 2 qam  3  qhs What changed:  how much to take how to take this when to take this additional instructions   cetirizine 10 MG tablet Commonly known as: ZYRTEC Can take one tablet by mouth once daily if needed for runny nose. What changed:  how much to take how to take this when to take this reasons to take this additional instructions   cyclobenzaprine 5 MG tablet Commonly known as: FLEXERIL Take 5 mg by mouth 3 (three) times daily as needed for muscle spasms.   diclofenac Sodium 1  % Gel Commonly known as: VOLTAREN Apply 4 g topically 4 (four) times daily. What changed:  when to take this reasons to take this   eucerin cream Apply 1 Application topically daily as needed for dry skin.   famotidine 20 MG tablet Commonly known as: PEPCID Take 1 tablet (20 mg total) by mouth daily. OTC   ferrous sulfate 325 (65 FE) MG EC tablet Take 1 tablet (325 mg total) by mouth daily with breakfast.   fluticasone 50 MCG/ACT nasal spray Commonly known as: FLONASE Place 1 spray into both nostrils 2 (two) times daily as needed for allergies or rhinitis.   gabapentin 100 MG capsule Commonly known as: NEURONTIN Take 1 capsule (100 mg total) by mouth 3 (three) times daily.   guaiFENesin-dextromethorphan 100-10 MG/5ML syrup Commonly known as: ROBITUSSIN DM Take 10 mLs by mouth every 4 (four) hours as needed for cough.   hydrochlorothiazide 12.5 MG capsule Commonly known as: MICROZIDE Take 1 capsule (12.5 mg total) by mouth daily. TAKE 1 CAPSULE(12.5 MG) BY MOUTH DAILY Strength: 12.5 mg What changed: additional instructions   hydrOXYzine 25 MG capsule Commonly known as: VISTARIL Take 1 capsule (25 mg total) by mouth every 6 (six) hours as needed for anxiety.   ipratropium-albuterol 0.5-2.5 (3) MG/3ML Soln Commonly known as: DUONEB Take 3 mLs by nebulization 2 (two) times daily.   irbesartan 75 MG tablet Commonly known as: AVAPRO TAKE 1 TABLET(75 MG) BY MOUTH DAILY What changed: See the new instructions.   montelukast 10 MG tablet Commonly known as: SINGULAIR Take 1 tablet (10 mg total) by mouth at bedtime.   ondansetron 4 MG disintegrating tablet Commonly known as: Zofran ODT Take 1 tablet (4 mg total) by mouth every 8 (eight) hours as needed for nausea or vomiting.   oseltamivir 75 MG capsule Commonly known as: TAMIFLU Take 1 capsule (75 mg total) by mouth 2 (two) times daily for 2 days.   pantoprazole 40 MG tablet Commonly known as: PROTONIX TAKE 1 TABLET  BY MOUTH EVERY DAY What changed:  how much to take how to take this when to take this additional instructions   potassium chloride 10 MEQ tablet Commonly known as: KLOR-CON M Take 1 tablet (10 mEq total) by mouth daily.   predniSONE 10 MG tablet Commonly known as: DELTASONE Prednisone 60 mg daily for 3 days followed by  Prednisone 40 mg daily for 3 days followed by  Prednisone 20 mg daily for 3 days followed by  Prednisone 10 mg daily for 3 days.   tacrolimus 0.03 % ointment Commonly known as: Protopic Apply topically 2 (two) times daily. What changed:  how much to take when to take this reasons to take this   triamcinolone ointment 0.1 % Commonly  known as: KENALOG Apply 1 Application topically 2 (two) times daily.   Vitamin D 125 MCG (5000 UT) Caps Take 5,000 Units by mouth daily.   Yupelri 175 MCG/3ML nebulizer solution Generic drug: revefenacin Take 3 mLs (175 mcg total) by nebulization daily.        Follow-up Information     Biagio Borg, MD Follow up.   Specialties: Internal Medicine, Radiology Contact information: [place] [address] [phone]         Biagio Borg, MD .   Specialties: Internal Medicine, Radiology Contact information: [place] [address] [phone]                Discharge Exam: Danley Danker Weights   01/11/23 0547  Weight: 82.6 kg   General exam: Appears calm and comfortable  Respiratory system: Clear to auscultation. Respiratory effort normal. Cardiovascular system: S1 & S2 heard, RRR. No JVD, murmurs, rubs, gallops or clicks. No pedal edema. Gastrointestinal system: Abdomen is nondistended, soft and nontender. No organomegaly or masses felt. Normal bowel sounds heard. Central nervous system: Alert and oriented. No focal neurological deficits. Extremities: Symmetric 5 x 5 power. Skin: No rashes, lesions or ulcers Psychiatry: Judgement and insight appear normal. Mood &  affect appropriate.    Condition at discharge: fair  The results of significant diagnostics from this hospitalization (including imaging, microbiology, ancillary and laboratory) are listed below for reference.   Imaging Studies: DG Chest Portable 1 View  Result Date: 01/11/2023 CLINICAL DATA:  62 year old female with shortness of breath. EXAM: PORTABLE CHEST 1 VIEW COMPARISON:  Chest CTA 10/08/2022 and earlier. FINDINGS: Portable AP semi upright view at 0601 hours. Chronic cervical ACDF partially visible. Lower lung volumes. Mediastinal contours are stable and within normal limits. Allowing for portable technique the lungs are clear. No pneumothorax or pleural effusion. Centrilobular emphysema demonstrated by CT last year. Paucity of bowel gas the visible abdomen. No acute osseous abnormality identified. IMPRESSION: Emphysema (ICD10-J43.9). Lower lung volumes. No acute cardiopulmonary abnormality. Electronically Signed   By: Genevie Ann M.D.   On: 01/11/2023 06:12   XR Knee 1-2 Views Right  Result Date: 01/09/2023 2 views of the right knee are normal.  The alignment is normal and the joint spaces well-maintained.  XR Knee 1-2 Views Left  Result Date: 01/09/2023 2 views of the left knee show normal alignment no acute findings.  There is no effusion.   Microbiology: Results for orders placed or performed during the hospital encounter of 01/11/23  Resp panel by RT-PCR (RSV, Flu A&B, Covid) Anterior Nasal Swab     Status: Abnormal   Collection Time: 01/11/23  5:52 AM   Specimen: Anterior Nasal Swab  Result Value Ref Range Status   SARS Coronavirus 2 by RT PCR NEGATIVE NEGATIVE Final    Comment: (NOTE) SARS-CoV-2 target nucleic acids are NOT DETECTED.  The SARS-CoV-2 RNA is generally detectable in upper respiratory specimens during the acute phase of infection. The lowest concentration of SARS-CoV-2 viral copies this assay can detect is 138 copies/mL. A negative result does not preclude  SARS-Cov-2 infection and should not be used as the sole basis for treatment or other patient management decisions. A negative result may occur with  improper specimen collection/handling, submission of specimen other than nasopharyngeal swab, presence of viral mutation(s) within the areas targeted by this assay, and inadequate number of viral copies(<138 copies/mL). A negative result must be combined with clinical observations, patient history, and epidemiological information. The  expected result is Negative.  Fact Sheet for Patients:  EntrepreneurPulse.com.au  Fact Sheet for Healthcare Providers:  IncredibleEmployment.be  This test is no t yet approved or cleared by the [place] FDA and  has been authorized for detection and/or diagnosis of SARS-CoV-2 by FDA under an Emergency Use Authorization (EUA). This EUA will remain  in effect (meaning this test can be used) for the duration of the COVID-19 declaration under Section 564(b)(1) of the Act, 21 U.S.C.section 360bbb-3(b)(1), unless the authorization is terminated  or revoked sooner.       Influenza A by PCR POSITIVE (A) NEGATIVE Final   Influenza B by PCR NEGATIVE NEGATIVE Final    Comment: (NOTE) The Xpert Xpress SARS-CoV-2/FLU/RSV plus assay is intended as an aid in the diagnosis of influenza from Nasopharyngeal swab specimens and should not be used as a sole basis for treatment. Nasal washings and aspirates are unacceptable for Xpert Xpress SARS-CoV-2/FLU/RSV testing.  Fact Sheet for Patients: EntrepreneurPulse.com.au  Fact Sheet for Healthcare Providers: IncredibleEmployment.be  This test is not yet approved or cleared by the [place] FDA and has been authorized for detection and/or diagnosis of SARS-CoV-2 by FDA under an Emergency Use Authorization (EUA). This EUA will remain in effect (meaning this test can be used) for the duration of  the COVID-19 declaration under Section 564(b)(1) of the Act, 21 U.S.C. section 360bbb-3(b)(1), unless the authorization is terminated or revoked.     Resp Syncytial Virus by PCR NEGATIVE NEGATIVE Final    Comment: (NOTE) Fact Sheet for Patients: EntrepreneurPulse.com.au  Fact Sheet for Healthcare Providers: IncredibleEmployment.be  This test is not yet approved or cleared by the [place] FDA and has been authorized for detection and/or diagnosis of SARS-CoV-2 by FDA under an Emergency Use Authorization (EUA). This EUA will remain in effect (meaning this test can be used) for the duration of the COVID-19 declaration under Section 564(b)(1) of the Act, 21 U.S.C. section 360bbb-3(b)(1), unless the authorization is terminated or revoked.  Performed at [facility], [place] [address] [place], [place] [address]    *Note: Due to a large number of results and/or encounters for the requested time period, some results have not been displayed. A complete set of results can be found in Results Review.    Labs: CBC: Recent Labs  Lab 01/11/23 0555 01/11/23 0605 01/13/23 1029  WBC 9.6  --  8.6  NEUTROABS 6.5  --  6.8  HGB 11.1* 11.6* 11.6*  HCT 33.0* 34.0* 35.7*  MCV 88.9  --  91.3  PLT 284  --  299   Basic Metabolic Panel: Recent Labs  Lab 01/11/23 0555 01/11/23 0605 01/13/23 1029  NA 138 138 136  K 3.4* 4.1 4.0  CL 98  --  100  CO2 28  --  27  GLUCOSE 145*  --  117*  BUN 7  --  26*  CREATININE 0.70  --  0.78  CALCIUM 8.7*  --  9.1   Liver Function Tests: No results for input(s): "AST", "ALT", "ALKPHOS", "BILITOT", "PROT", "ALBUMIN" in the last 168 hours. CBG: No results for input(s): "GLUCAP" in the last 168 hours.  Discharge time spent:  34 minutes.  Signed: Hosie Poisson, MD [facility]

## 2023-01-17 NOTE — Patient Instructions (Addendum)
Visit Information  Thank you for taking time to visit with me today. Please don't hesitate to contact me if I can be of assistance to you.   Following are the goals we discussed today:   Goals Addressed             This Visit's Progress    Support with Health managment education       Care Coordination Interventions: Encouraged to continue to take medications as prescribed Confirmed received supplies for BiPap and is using nightly Provided emmi education via mail: exacerbation of COPD and COPD action plan Reviewed upcoming/scheduled appointments Discussed transportation benefits via [facility] and/or Medicaid if needed. Encouraged patient to contact [facility] provider to discuss how to access transportation benefit. Also encouraged patient to discuss Medicaid transportation with Medicaid care manager for future use if needed. Provided contact number for [facility] and encouraged to contact re: care coordination as needed.       Our next appointment is by telephone on 01/31/23 at 11:00 am  Please call the care guide team at [phone] if you need to cancel or reschedule your appointment.   If you are experiencing a Mental Health or [place] or need someone to talk to, please call the Suicide and Crisis Lifeline: [place], RN, MSN, BSN, [place] [phone]

## 2023-01-17 NOTE — Patient Outreach (Signed)
  Care Coordination   Follow Up Visit Note   01/17/2023 Name: [name] MRN: [id number] DOB: Sep 30, 1961  [name] is a 62 y.o. year old female who sees Felicia Borg, MD for primary care. I spoke with  [name] by phone today.  What matters to the patients health and wellness today?  [name] reports she continues to monitor her oxygen saturations. She states this morning her oxygen saturation was 92%. She reports she is taking medications as prescribed. She is without questions or concerns at this time.    Goals Addressed             This Visit's Progress    Support with Health managment education       Care Coordination Interventions: Encouraged to continue to take medications as prescribed Confirmed received supplies for BiPap and is using nightly Provided emmi education via mail: exacerbation of COPD and COPD action plan Reviewed upcoming/scheduled appointments Discussed transportation benefits via [facility] and/or Medicaid if needed. Encouraged patient to contact [facility] provider to discuss how to access transportation benefit. Also encouraged patient to discuss Medicaid transportation with Medicaid care manager for future use if needed. Provided contact number for [facility] and encouraged to contact re: care coordination as needed.       SDOH assessments and interventions completed:  No  Care Coordination Interventions:  Yes, provided   Follow up plan: Follow up call scheduled for 01/31/23    Encounter Outcome:  Pt. Visit Completed   Thea Silversmith, RN, MSN, BSN, [place] Coordinator [phone]

## 2023-01-22 ENCOUNTER — Encounter: Payer: Self-pay | Admitting: Primary Care

## 2023-01-22 ENCOUNTER — Ambulatory Visit (INDEPENDENT_AMBULATORY_CARE_PROVIDER_SITE_OTHER): Payer: Medicare HMO | Admitting: Primary Care

## 2023-01-22 VITALS — BP 128/92 | HR 103 | Ht 63.0 in | Wt 193.0 lb

## 2023-01-22 DIAGNOSIS — J9611 Chronic respiratory failure with hypoxia: Secondary | ICD-10-CM | POA: Diagnosis not present

## 2023-01-22 DIAGNOSIS — K219 Gastro-esophageal reflux disease without esophagitis: Secondary | ICD-10-CM | POA: Diagnosis not present

## 2023-01-22 DIAGNOSIS — J9612 Chronic respiratory failure with hypercapnia: Secondary | ICD-10-CM

## 2023-01-22 DIAGNOSIS — J441 Chronic obstructive pulmonary disease with (acute) exacerbation: Secondary | ICD-10-CM

## 2023-01-22 DIAGNOSIS — J309 Allergic rhinitis, unspecified: Secondary | ICD-10-CM | POA: Diagnosis not present

## 2023-01-22 DIAGNOSIS — J4489 Other specified chronic obstructive pulmonary disease: Secondary | ICD-10-CM | POA: Diagnosis not present

## 2023-01-22 DIAGNOSIS — R0602 Shortness of breath: Secondary | ICD-10-CM

## 2023-01-22 MED ORDER — PANTOPRAZOLE SODIUM 40 MG PO TBEC: 40.0000 mg | DELAYED_RELEASE_TABLET | Freq: Two times a day (BID) | ORAL | 3 refills | Status: DC

## 2023-01-22 MED ORDER — METHYLPREDNISOLONE ACETATE 80 MG/ML IJ SUSP
80.0000 mg | Freq: Once | INTRAMUSCULAR | Status: AC
  Administered 2023-01-24: 80 mg via INTRAMUSCULAR

## 2023-01-22 MED ORDER — FAMOTIDINE 40 MG PO TABS: 40.0000 mg | ORAL_TABLET | Freq: Every day | ORAL | 3 refills | Status: DC

## 2023-01-22 MED ORDER — PREDNISONE 10 MG PO TABS: ORAL_TABLET | ORAL | 0 refills | Status: AC

## 2023-01-22 NOTE — Assessment & Plan Note (Addendum)
-   Seen today for acute exacerbation Asthma/COPD syndrome with LPR contributing  - Patient had audible upper airway wheezing on exam today - Received depo-medrol '80mg'$  IM x1 and ipratropium-albuterol x 1 in office today with improvement  - Plan to increase Protonix to 40 mg twice daily and famotidine 40 mg at bedtime. Sending in prednisone taper.  - We will rerefer patient to ENT due to LPR, likely needs laryngoscopy to look at larynx and vocal cords  - Given patient education for GERD diet

## 2023-01-22 NOTE — Assessment & Plan Note (Addendum)
-   Acutely exacerbated; She has audible upper airway wheezing. Reflux significantly contributing.  - Pulmonary function testing in December 2023 showed very severe obstruction with positive bronchodilator response, hyperal inflated lung volumes and decreased diffusion capacity - Sending in prednisone taper ('50mg'$  x 5 days; '40mg'$  x 3 days; '30mg'$  x 3 days; '20mg'$  x 3 days; '10mg'$  x 3 days) - Received depomedrol '80mg'$  IM x1 and Duonoeb x1 if office with noticeable improvement in wheezing symptoms - Continue Breztri Aerosphere 2 puffs twice daily, Dupixent every 14 days, singular 10 mg at bedtime - Follow-up with Dr. Lamonte Sakai first available

## 2023-01-22 NOTE — Progress Notes (Signed)
$'@Patient'N$  ID: [name], female    DOB: 03/14/1961, 62 y.o.   MRN: [id number]  Chief Complaint  Patient presents with   Acute Visit    Wheezing,SOB     Referring provider: Biagio Borg, MD  HPI: 62 year old female, former smoker. PMH significant for HTN, aortic atherosclerosis, COPD/asthma, chronic respiratory failure, LPR, seizure. Patient of Dr. Lamonte Sakai, last seen in office on 12/05/2022 for asthma/COPD overlap  Previous [facility] pulmonary encounter: ROV 12/05/22 --follow-up visit for a 62 year old woman with asthmatic COPD with frequent exacerbations, irritable upper airway syndrome and VCD.  All impacted by GERD, chronic rhinitis.  She has chronic hypercapnic respiratory failure and is on home BiPAP, wears it reliably.  I saw her in November.  She had been changed to nebulized regimen of Perforomist, Pulmicort and Maretta Bees - tells me that she cannot tolerate these, caused SOB, wheeze. She went back to breztri. She uses albuterol a few times a day.  She is also on Dupixent every 14 days, Flonase, Xyzal, Singulair, Pepcid, Protonix.  She underwent pulmonary function testing today as below.    PFT performed 12/05/2022 and reviewed by me show very severe obstruction with a positive bronchodilator response, hyperinflated lung volumes, decreased diffusion capacity that does not fully correct when adjusted for alveolar volume.  Asthma-COPD overlap syndrome Severe obstruction with a positive bronchodilator response confirmed on pulmonary function testing today.  She did try the long-acting nebulized regimen as prescribed but stated that she had side effects, had more shortness of breath (question whether this exacerbated her upper airway irritation).  She went back to the [place] and appears to be tolerating.  She wants a spacer and we will provide her with this.  Will work to control contributing factors including GERD and rhinitis.  She is benefiting from the Dupixent   Allergic  rhinitis Continue fluticasone nasal spray, Xyzal, Singulair, Dupixent.  Follows with Dr. Ernst Bowler   Laryngopharyngeal reflux (LPR) Fairly good control on Pepcid and Protonix.   Chronic respiratory failure with hypoxia and hypercapnia (HCC) Using BiPAP reliably at night.  Continue same.  All of her supplies are in order  01/22/2023- Interim hx  Patient presents today for acute office visit.Wheezing symptoms have been worse last 2-3 weeks. She has an occasional cough with mucus production. She was given prednisone on 01/15/22, currently on '20mg'$  dose of taper. She is compliant with all her medications including Flonase, Xyzal, Singulair, Dupixent every 14 days and [employer]. She has reflux symptoms. She takes protonix '40mg'$  daily and famotidine '20mg'$  daily. She saw [facility] in December 2023 for sinus issues/cerumen impaction, LPR not assessed during that visit. She reports compliance with BIPAP.   Allergies  Allergen Reactions   Sulfa Antibiotics Nausea And Vomiting   Venofer [Iron Sucrose] Hives    Immunization History  Administered Date(s) Administered   Influenza Split 10/23/2011   Influenza Whole 09/16/2012   Influenza,inj,Quad PF,6+ Mos 10/18/2014, 08/31/2015, 09/25/2016, 10/09/2017, 09/23/2018, 08/12/2019, 10/06/2020, 11/24/2021, 08/10/2022   PFIZER(Purple Top)SARS-COV-2 Vaccination 02/24/2020, 03/16/2020, 10/29/2020   PNEUMOCOCCAL CONJUGATE-20 06/15/2021   Pneumococcal Polysaccharide-23 12/02/2015   Td 04/22/2014   Zoster, Unspecified 11/19/2022    Past Medical History:  Diagnosis Date   Anemia, iron deficiency 12/22/2014   pt. 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  Bipolar disorder ([place])    with anxiety, depression   COPD (chronic obstructive pulmonary disease) ([place])     Eczema 05/18/2022   Fibromyalgia 05/14/2014   GERD (gastroesophageal reflux disease)    History of kidney stones    Hyperlipidemia 04/20/2017   HYPERTENSION 05/12/2009   Qualifier: Diagnosis of  By: Lenn Cal Deborra Medina), [name]     Peripheral vascular disease ([place])    Pneumonia    Prediabetes 02/23/2014   pt. denies   Seizure ([place])    Stroke ([place]) 11/2020   Urticaria     Tobacco History: Social History   Tobacco Use  Smoking Status Former   Packs/day: 0.25   Years: 36.00   Total pack years: 9.00   Types: Cigarettes   Quit date: 01/21/2019   Years since quitting: 4.0  Smokeless Tobacco Never  Tobacco Comments   Passive smoker.  Patient states her quit date was 05/09/2018.  Patient educated with resources at today's appointment to continue to support her to stop smoking.   Counseling given: Not Answered Tobacco comments: Passive smoker.  Patient states her quit date was 05/09/2018.  Patient educated with resources at today's appointment to continue to support her to stop smoking.   Outpatient Medications Prior to Visit  Medication Sig Dispense Refill   albuterol (VENTOLIN HFA) 108 (90 Base) MCG/ACT inhaler Inhale 2 puffs into the lungs every 6 (six) hours as needed for wheezing or shortness of breath. 18 g 1   atorvastatin (LIPITOR) 40 MG tablet Take 1 tablet (40 mg total) by mouth daily. TAKE 1 TABLET(40 MG) BY MOUTH DAILY Strength: 40 mg (Patient taking differently: Take 40 mg by mouth daily.) 90 tablet 3   azelastine (OPTIVAR) 0.05 % ophthalmic solution Place 1 drop into both eyes 2 (two) times daily. 6 mL 12   BELSOMRA 15 MG TABS Take 15 mg by mouth at bedtime as needed (sleep).     BREZTRI AEROSPHERE 160-9-4.8 MCG/ACT AERO Inhale 2 puffs into the lungs in the morning and at bedtime.     butalbital-acetaminophen-caffeine (FIORICET) 50-325-40 MG tablet 1 tab by mouth once daily as needed for HA, limit 14 per month 14 tablet 0   carbamazepine (TEGRETOL) 200 MG tablet 2 qam  3  qhs  (Patient taking differently: Take 400-600 mg by mouth See admin instructions. Take '400mg'$  in the morning and '600mg'$  at bedtime.) 150 tablet 3   cetirizine (ZYRTEC) 10 MG tablet Can take one tablet by mouth once daily if needed for runny nose. (Patient taking differently: Take 10 mg by mouth daily as needed for rhinitis.) 30 tablet 5   Cholecalciferol (VITAMIN D) 125 MCG (5000 UT) CAPS Take 5,000 Units by mouth daily.     cyclobenzaprine (FLEXERIL) 5 MG tablet Take 5 mg by mouth 3 (three) times daily as needed for muscle spasms.     diclofenac Sodium (VOLTAREN) 1 % GEL Apply 4 g topically 4 (four) times daily. (Patient taking differently: Apply 4 g topically 4 (four) times daily as needed (pain).) 100 g 0   ferrous sulfate 325 (65 FE) MG EC tablet Take 1 tablet (325 mg total) by mouth daily with breakfast. 90 tablet 1   fluticasone (FLONASE) 50 MCG/ACT nasal spray Place 1 spray into both nostrils 2 (two) times daily as needed for allergies or rhinitis. 16 g 5   gabapentin (NEURONTIN) 100 MG capsule Take 1 capsule (100 mg total) by mouth 3 (three) times daily. 90 capsule 3   hydrochlorothiazide (MICROZIDE) 12.5 MG capsule Take  1 capsule (12.5 mg total) by mouth daily. TAKE 1 CAPSULE(12.5 MG) BY MOUTH DAILY Strength: 12.5 mg (Patient taking differently: Take 12.5 mg by mouth daily.) 90 capsule 3   hydrOXYzine (VISTARIL) 25 MG capsule Take 1 capsule (25 mg total) by mouth every 6 (six) hours as needed for anxiety. 25 capsule 4   ipratropium-albuterol (DUONEB) 0.5-2.5 (3) MG/3ML SOLN Take 3 mLs by nebulization 2 (two) times daily. 360 mL 3   irbesartan (AVAPRO) 75 MG tablet TAKE 1 TABLET(75 MG) BY MOUTH DAILY (Patient taking differently: Take 75 mg by mouth daily.) 90 tablet 1   ondansetron (ZOFRAN ODT) 4 MG disintegrating tablet Take 1 tablet (4 mg total) by mouth every 8 (eight) hours as needed for nausea or vomiting. 20 tablet 0   pantoprazole (PROTONIX) 40 MG tablet TAKE 1 TABLET BY MOUTH EVERY DAY  (Patient taking differently: Take 40 mg by mouth daily.) 90 tablet 1   potassium chloride (KLOR-CON M) 10 MEQ tablet Take 1 tablet (10 mEq total) by mouth daily. 90 tablet 3   revefenacin (YUPELRI) 175 MCG/3ML nebulizer solution Take 3 mLs (175 mcg total) by nebulization daily. 90 mL 5   Skin Protectants, Misc. (EUCERIN) cream Apply 1 Application topically daily as needed for dry skin.     tacrolimus (PROTOPIC) 0.03 % ointment Apply topically 2 (two) times daily. (Patient taking differently: Apply 1 Application topically 2 (two) times daily as needed (rash).) 100 g 0   triamcinolone ointment (KENALOG) 0.1 % Apply 1 Application topically 2 (two) times daily. 30 g 1   predniSONE (DELTASONE) 10 MG tablet Prednisone 60 mg daily for 3 days followed by  Prednisone 40 mg daily for 3 days followed by  Prednisone 20 mg daily for 3 days followed by  Prednisone 10 mg daily for 3 days. 39 tablet 0   guaiFENesin-dextromethorphan (ROBITUSSIN DM) 100-10 MG/5ML syrup Take 10 mLs by mouth every 4 (four) hours as needed for cough. (Patient not taking: Reported on 01/22/2023) 118 mL 0   montelukast (SINGULAIR) 10 MG tablet Take 1 tablet (10 mg total) by mouth at bedtime. 90 tablet 1   famotidine (PEPCID) 20 MG tablet Take 1 tablet (20 mg total) by mouth daily. OTC 90 tablet 1   Facility-Administered Medications Prior to Visit  Medication Dose Route Frequency Provider Last Rate Last Admin   dupilumab (DUPIXENT) prefilled syringe 300 mg  300 mg Subcutaneous Q14 Days Valentina Shaggy, MD   300 mg at 01/17/23 1008    Review of Systems  Review of Systems  Constitutional: Negative.   HENT:  Negative for congestion and postnasal drip.   Respiratory:  Positive for cough, shortness of breath and wheezing.   Cardiovascular: Negative.    Physical Exam  BP (!) 128/92 (BP Location: [name] Arm, Patient Position: Sitting, Cuff Size: Large)   Pulse (!) 103   Ht '5\' 3"'$  (1.6 m)   Wt 193 lb (87.5 kg)   SpO2 96%   BMI  34.19 kg/m  Physical Exam Constitutional:      General: She is not in acute distress.    Appearance: Normal appearance. She is obese. She is not ill-appearing.  HENT:     Head: Normocephalic and atraumatic.  Cardiovascular:     Rate and Rhythm: Regular rhythm. Tachycardia present.  Pulmonary:     Effort: Pulmonary effort is normal.     Breath sounds: Wheezing present.  Musculoskeletal:        General: Normal range of motion.  Skin:  General: Skin is warm and dry.  Neurological:     General: No focal deficit present.     Mental Status: She is alert and oriented to person, place, and time. Mental status is at baseline.  Psychiatric:        Mood and Affect: Mood normal.        Behavior: Behavior normal.        Thought Content: Thought content normal.        Judgment: Judgment normal.      Lab Results:  CBC    Component Value Date/Time   WBC 8.6 01/13/2023 1029   RBC 3.91 01/13/2023 1029   HGB 11.6 (L) 01/13/2023 1029   HGB 11.1 10/18/2022 1216   HCT 35.7 (L) 01/13/2023 1029   HCT 34.4 10/18/2022 1216   PLT 310 01/13/2023 1029   PLT 431 10/18/2022 1216   MCV 91.3 01/13/2023 1029   MCV 83 10/18/2022 1216   MCH 29.7 01/13/2023 1029   MCHC 32.5 01/13/2023 1029   RDW 15.2 01/13/2023 1029   RDW 18.5 (H) 10/18/2022 1216   LYMPHSABS 1.1 01/13/2023 1029   LYMPHSABS 2.3 10/18/2022 1216   MONOABS 0.7 01/13/2023 1029   EOSABS 0.0 01/13/2023 1029   EOSABS 0.1 10/18/2022 1216   BASOSABS 0.0 01/13/2023 1029   BASOSABS 0.0 10/18/2022 1216    BMET    Component Value Date/Time   NA 136 01/13/2023 1029   NA 144 07/31/2019 1151   K 4.0 01/13/2023 1029   CL 100 01/13/2023 1029   CO2 27 01/13/2023 1029   GLUCOSE 117 (H) 01/13/2023 1029   BUN 26 (H) 01/13/2023 1029   BUN 11 07/31/2019 1151   CREATININE 0.78 01/13/2023 1029   CREATININE 0.71 02/23/2014 1054   CALCIUM 9.1 01/13/2023 1029   GFRNONAA >60 01/13/2023 1029   GFRNONAA >89 02/23/2014 1054   GFRAA >60 07/19/2020  1113   GFRAA >89 02/23/2014 1054    BNP    Component Value Date/Time   BNP 24.1 10/07/2022 2000    ProBNP    Component Value Date/Time   PROBNP 15.0 04/19/2017 1636    Imaging: DG Chest Portable 1 View  Result Date: 01/11/2023 CLINICAL DATA:  62 year old female with shortness of breath. EXAM: PORTABLE CHEST 1 VIEW COMPARISON:  Chest CTA 10/08/2022 and earlier. FINDINGS: Portable AP semi upright view at 0601 hours. Chronic cervical ACDF partially visible. Lower lung volumes. Mediastinal contours are stable and within normal limits. Allowing for portable technique the lungs are clear. No pneumothorax or pleural effusion. Centrilobular emphysema demonstrated by CT last year. Paucity of bowel gas the visible abdomen. No acute osseous abnormality identified. IMPRESSION: Emphysema (ICD10-J43.9). Lower lung volumes. No acute cardiopulmonary abnormality. Electronically Signed   By: Genevie Ann M.D.   On: 01/11/2023 06:12   XR Knee 1-2 Views [name]  Result Date: 01/09/2023 2 views of the [name] knee are normal.  The alignment is normal and the joint spaces well-maintained.  XR Knee 1-2 Views Left  Result Date: 01/09/2023 2 views of the left knee show normal alignment no acute findings.  There is no effusion.    Assessment & Plan:   Laryngopharyngeal reflux (LPR) - Seen today for acute exacerbation Asthma/COPD syndrome with LPR contributing  - Patient had audible upper airway wheezing on exam today - Received depo-medrol '80mg'$  IM x1 and ipratropium-albuterol x 1 in office today with improvement  - Plan to increase Protonix to 40 mg twice daily and famotidine 40 mg at bedtime. Sending  in prednisone taper.  - We will rerefer patient to ENT due to LPR, likely needs laryngoscopy to look at larynx and vocal cords  - Given patient education for GERD diet   Asthma-COPD overlap syndrome - Acutely exacerbated; She has audible upper airway wheezing. Reflux significantly contributing.  - Pulmonary  function testing in December 2023 showed very severe obstruction with positive bronchodilator response, hyperal inflated lung volumes and decreased diffusion capacity - Sending in prednisone taper ('50mg'$  x 5 days; '40mg'$  x 3 days; '30mg'$  x 3 days; '20mg'$  x 3 days; '10mg'$  x 3 days) - Received depomedrol '80mg'$  IM x1 and Duonoeb x1 if office with noticeable improvement in wheezing symptoms - Continue Breztri Aerosphere 2 puffs twice daily, Dupixent every 14 days, singular 10 mg at bedtime - Follow-up with Dr. Lamonte Sakai first available   Allergic rhinitis - Stable; Continue fluticasone nasal spray, Xyzal and Singulair as directed  Chronic respiratory failure with hypoxia and hypercapnia (HCC) - Continue BiPAP nightly    Martyn Ehrich, NP 01/22/2023

## 2023-01-22 NOTE — Assessment & Plan Note (Addendum)
-   Stable; Continue fluticasone nasal spray, Xyzal and Singulair as directed

## 2023-01-22 NOTE — Patient Instructions (Addendum)
You saw ENT with [facility] back in December for sinus issues, I need you to see them back for reflux evaluation (they will need to evaluate your larynx with scope)   Recommendations: Increase Protonix '40mg'$  twice daily Increase Famotidine '40mg'$  at bedtime Continue to wear BIPAP at night Continue Breztri Aerosphere two puffs twice daily  Start new Prednisone taper tomorrow ('50mg'$  x 5 days, then '40mg'$  x 3 days, '30mg'$  x 3 days, '20mg'$  x 3 days, '10mg'$  x 3 days)  Office visit: Duoneb x1  Depmedrol '80mg'$  x1 in office   Refer: ENT re: LPR   Follow-up: First available with Dr. Lamonte Sakai     Food Choices for Gastroesophageal Reflux Disease, Adult When you have gastroesophageal reflux disease (GERD), the foods you eat and your eating habits are very important. Choosing the right foods can help ease your discomfort. Think about working with a food expert (dietitian) to help you make good choices. What are tips for following this plan? Reading food labels Look for foods that are low in saturated fat. Foods that may help with your symptoms include: Foods that have less than 5% of daily value (DV) of fat. Foods that have 0 grams of trans fat. Cooking Do not fry your food. Cook your food by baking, steaming, grilling, or broiling. These are all methods that do not need a lot of fat for cooking. To add flavor, try to use herbs that are low in spice and acidity. Meal planning  Choose healthy foods that are low in fat, such as: Fruits and vegetables. Whole grains. Low-fat dairy products. Lean meats, fish, and poultry. Eat small meals often instead of eating 3 large meals each day. Eat your meals slowly in a place where you are relaxed. Avoid bending over or lying down until 2-3 hours after eating. Limit high-fat foods such as fatty meats or fried foods. Limit your intake of fatty foods, such as oils, butter, and shortening. Avoid the following as told by your doctor: Foods that cause symptoms.  These may be different for different people. Keep a food diary to keep track of foods that cause symptoms. Alcohol. Drinking a lot of liquid with meals. Eating meals during the 2-3 hours before bed. Lifestyle Stay at a healthy weight. Ask your doctor what weight is healthy for you. If you need to lose weight, work with your doctor to do so safely. Exercise for at least 30 minutes on 5 or more days each week, or as told by your doctor. Wear loose-fitting clothes. Do not smoke or use any products that contain nicotine or tobacco. If you need help quitting, ask your doctor. Sleep with the head of your bed higher than your feet. Use a wedge under the mattress or blocks under the bed frame to raise the head of the bed. Chew sugar-free gum after meals. What foods should eat?  Eat a healthy, well-balanced diet of fruits, vegetables, whole grains, low-fat dairy products, lean meats, fish, and poultry. Each person is different. Foods that may cause symptoms in one person may not cause any symptoms in another person. Work with your doctor to find foods that are safe for you. The items listed above may not be a complete list of what you can eat and drink. Contact a food expert for more options. What foods should I avoid? Limiting some of these foods may help in managing the symptoms of GERD. Everyone is different. Talk with a food expert or your doctor to help you find  the exact foods to avoid, if any. Fruits Any fruits prepared with added fat. Any fruits that cause symptoms. For some people, this may include citrus fruits, such as oranges, grapefruit, pineapple, and lemons. Vegetables Deep-fried vegetables. [place] fries. Any vegetables prepared with added fat. Any vegetables that cause symptoms. For some people, this may include tomatoes and tomato products, chili peppers, onions and garlic, and horseradish. Grains Pastries or quick breads with added fat. Meats and other proteins High-fat meats, such  as fatty beef or pork, hot dogs, ribs, ham, sausage, salami, and bacon. Fried meat or protein, including fried fish and fried chicken. Nuts and nut butters, in large amounts. Dairy Whole milk and chocolate milk. Sour cream. Cream. Ice cream. Cream cheese. Milkshakes. Fats and oils Butter. Margarine. Shortening. Ghee. Beverages Coffee and tea, with or without caffeine. Carbonated beverages. Sodas. Energy drinks. Fruit juice made with acidic fruits, such as orange or grapefruit. Tomato juice. Alcoholic drinks. Sweets and desserts Chocolate and cocoa. Donuts. Seasonings and condiments Pepper. Peppermint and spearmint. Added salt. Any condiments, herbs, or seasonings that cause symptoms. For some people, this may include curry, hot sauce, or vinegar-based salad dressings. The items listed above may not be a complete list of what you should not eat and drink. Contact a food expert for more options. Questions to ask your doctor Diet and lifestyle changes are often the first steps that are taken to manage symptoms of GERD. If diet and lifestyle changes do not help, talk with your doctor about taking medicines. Where to find more information International Foundation for Gastrointestinal Disorders: aboutgerd.org Summary When you have GERD, food and lifestyle choices are very important in easing your symptoms. Eat small meals often instead of 3 large meals a day. Eat your meals slowly and in a place where you are relaxed. Avoid bending over or lying down until 2-3 hours after eating. Limit high-fat foods such as fatty meats or fried foods. This information is not intended to replace advice given to you by your health care provider. Make sure you discuss any questions you have with your health care provider. Document Revised: 06/13/2020 Document Reviewed: 06/13/2020 Elsevier Patient Education  [place].

## 2023-01-22 NOTE — Assessment & Plan Note (Signed)
-   Continue BiPAP nightly

## 2023-01-23 ENCOUNTER — Ambulatory Visit: Payer: Medicare HMO

## 2023-01-24 DIAGNOSIS — J441 Chronic obstructive pulmonary disease with (acute) exacerbation: Secondary | ICD-10-CM | POA: Diagnosis not present

## 2023-01-24 DIAGNOSIS — J9612 Chronic respiratory failure with hypercapnia: Secondary | ICD-10-CM | POA: Diagnosis not present

## 2023-01-24 DIAGNOSIS — J4489 Other specified chronic obstructive pulmonary disease: Secondary | ICD-10-CM | POA: Diagnosis not present

## 2023-01-24 DIAGNOSIS — K219 Gastro-esophageal reflux disease without esophagitis: Secondary | ICD-10-CM | POA: Diagnosis not present

## 2023-01-24 DIAGNOSIS — J309 Allergic rhinitis, unspecified: Secondary | ICD-10-CM | POA: Diagnosis not present

## 2023-01-24 DIAGNOSIS — J9611 Chronic respiratory failure with hypoxia: Secondary | ICD-10-CM | POA: Diagnosis not present

## 2023-01-25 ENCOUNTER — Ambulatory Visit (INDEPENDENT_AMBULATORY_CARE_PROVIDER_SITE_OTHER): Payer: Medicare HMO | Admitting: Internal Medicine

## 2023-01-25 VITALS — BP 126/78 | HR 93 | Temp 98.0°F | Ht 63.0 in | Wt 192.0 lb

## 2023-01-25 DIAGNOSIS — F419 Anxiety disorder, unspecified: Secondary | ICD-10-CM | POA: Diagnosis not present

## 2023-01-25 DIAGNOSIS — E559 Vitamin D deficiency, unspecified: Secondary | ICD-10-CM

## 2023-01-25 DIAGNOSIS — J45901 Unspecified asthma with (acute) exacerbation: Secondary | ICD-10-CM | POA: Diagnosis not present

## 2023-01-25 DIAGNOSIS — R32 Unspecified urinary incontinence: Secondary | ICD-10-CM | POA: Diagnosis not present

## 2023-01-25 DIAGNOSIS — K219 Gastro-esophageal reflux disease without esophagitis: Secondary | ICD-10-CM | POA: Diagnosis not present

## 2023-01-25 DIAGNOSIS — N3941 Urge incontinence: Secondary | ICD-10-CM | POA: Insufficient documentation

## 2023-01-25 DIAGNOSIS — J449 Chronic obstructive pulmonary disease, unspecified: Secondary | ICD-10-CM | POA: Diagnosis not present

## 2023-01-25 LAB — URINALYSIS, ROUTINE W REFLEX MICROSCOPIC
Bilirubin Urine: NEGATIVE
Hgb urine dipstick: NEGATIVE
Ketones, ur: NEGATIVE
Leukocytes,Ua: NEGATIVE
Nitrite: NEGATIVE
Specific Gravity, Urine: 1.02 (ref 1.000–1.030)
Total Protein, Urine: NEGATIVE
Urine Glucose: NEGATIVE
Urobilinogen, UA: 0.2 (ref 0.0–1.0)
pH: 6 (ref 5.0–8.0)

## 2023-01-25 MED ORDER — METHYLPREDNISOLONE ACETATE 80 MG/ML IJ SUSP
80.0000 mg | Freq: Once | INTRAMUSCULAR | Status: AC
  Administered 2023-01-25: 80 mg via INTRAMUSCULAR

## 2023-01-25 NOTE — Patient Instructions (Signed)
You had the steroid shot today  Please continue all other medications as before, and refills have been done if requested.  Please have the pharmacy call with any other refills you may need.  Please keep your appointments with your specialists as you may have planned  Please go to the LAB at the blood drawing area for the tests to be done - the urine testing today  You will be contacted by phone if any changes need to be made immediately.  Otherwise, you will receive a letter about your results with an explanation, but please check with MyChart first.  Please remember to sign up for MyChart if you have not done so, as this will be important to you in the future with finding out test results, communicating by private email, and scheduling acute appointments online when needed.

## 2023-01-25 NOTE — Progress Notes (Unsigned)
Patient ID: [name], female   DOB: Apr 19, 1961, 62 y.o.   MRN: [id number]        Chief Complaint: follow up hospn jan 26-30 2024 with asthma exacerbation       HPI:  [name] is a 62 y.o. female here overall some improved but still significant wheezing, sob, and cough with occasional urinary incontinence.  Denies urinary symptoms such as dysuria, frequency, urgency, flank pain, hematuria or n/v, fever, chills.  Pt denies chest pain orthopnea, PND, increased LE swelling, palpitations, dizziness or syncope.   Pt denies fever, wt loss, night sweats, loss of appetite, or other constitutional symptoms  Denies worsening depressive symptoms, suicidal ideation, or panic; has ongoing anxiety  Has seen pulm feb 6 with steroid shot for persistent symptoms Denies worsening depressive symptoms, suicidal ideation, or panic       Wt Readings from Last 3 Encounters:  01/25/23 192 lb (87.1 kg)  01/22/23 193 lb (87.5 kg)  01/11/23 182 lb (82.6 kg)   BP Readings from Last 3 Encounters:  01/25/23 126/78  01/22/23 (!) 128/92  01/15/23 122/77         Past Medical History:  Diagnosis Date   Anemia, iron deficiency 12/22/2014   pt. 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      Bipolar disorder ([place])    with anxiety, depression   COPD (chronic obstructive pulmonary disease) ([place])    Eczema 05/18/2022   Fibromyalgia 05/14/2014   GERD (gastroesophageal reflux disease)    History of kidney stones    Hyperlipidemia 04/20/2017   HYPERTENSION 05/12/2009   Qualifier: Diagnosis of  By: Lenn Cal Deborra Medina), [name]     Peripheral vascular disease ([place])    Pneumonia    Prediabetes 02/23/2014   pt. denies   Seizure ([place])    Stroke ([place]) 11/2020   Urticaria    Past Surgical History:   Procedure Laterality Date   ABDOMINAL HYSTERECTOMY     ANTERIOR CERVICAL DECOMP/DISCECTOMY FUSION N/A 07/28/2020   Procedure: ANTERIOR CERVICAL DECOMPRESSION/DISCECTOMY FUSION. INTERBODY PROTHESIS, PLATE/SCREWS CERVICAL THREE- CERVICAL FOUR, CERVICAL FOUR- CERVICAL FIVE;  Surgeon: Newman Pies, MD;  Location: [facility];  Service: Neurosurgery;  Laterality: N/A;   BACK SURGERY     COLONOSCOPY  12/20/2011   Procedure: COLONOSCOPY;  Surgeon: Landry Dyke, MD;  Location: [facility];  Service: Endoscopy;  Laterality: N/A;   COLONOSCOPY  03/05/2012   Procedure: COLONOSCOPY;  Surgeon: Landry Dyke, MD;  Location: [facility];  Service: Endoscopy;  Laterality: N/A;   DIAGNOSTIC LAPAROSCOPY     HEMORRHOID SURGERY     INCISE AND DRAIN ABCESS     KIDNEY STONE SURGERY     NECK SURGERY     x 2 Dr Orinda Kenner   SPINE SURGERY  2019   TOE SURGERY     TUBAL LIGATION      reports that she quit smoking about 4 years ago. Her smoking use included cigarettes. She has a 9.00 pack-year smoking history. She has never used smokeless tobacco. She reports that she does not currently use alcohol. She reports that she does not currently use drugs after having used the following drugs: Marijuana. family history includes Alcohol abuse in her brother and father; Anemia in her daughter, sister, and sister; Anesthesia problems in her father;  Asthma in her brother, mother, and sister; Brain cancer in her sister; Breast cancer (age of onset: 49) in her sister; COPD in her mother and sister; Colon cancer (age of onset: 40) in her father; Diabetes in her brother, father, mother, sister, sister, sister, and sister; Heart disease in her brother, brother, mother, and sister; Hypertension in her brother, daughter, sister, sister, sister, sister, and sister; Kidney disease in her father; Sleep apnea in her brother. Allergies  Allergen Reactions   Sulfa Antibiotics Nausea And Vomiting   Venofer [Iron Sucrose] Hives   Current  Outpatient Medications on File Prior to Visit  Medication Sig Dispense Refill   albuterol (VENTOLIN HFA) 108 (90 Base) MCG/ACT inhaler Inhale 2 puffs into the lungs every 6 (six) hours as needed for wheezing or shortness of breath. 18 g 1   atorvastatin (LIPITOR) 40 MG tablet Take 1 tablet (40 mg total) by mouth daily. TAKE 1 TABLET(40 MG) BY MOUTH DAILY Strength: 40 mg (Patient taking differently: Take 40 mg by mouth daily.) 90 tablet 3   azelastine (OPTIVAR) 0.05 % ophthalmic solution Place 1 drop into both eyes 2 (two) times daily. 6 mL 12   BELSOMRA 15 MG TABS Take 15 mg by mouth at bedtime as needed (sleep).     BREZTRI AEROSPHERE 160-9-4.8 MCG/ACT AERO Inhale 2 puffs into the lungs in the morning and at bedtime.     butalbital-acetaminophen-caffeine (FIORICET) 50-325-40 MG tablet 1 tab by mouth once daily as needed for HA, limit 14 per month 14 tablet 0   carbamazepine (TEGRETOL) 200 MG tablet 2 qam  3  qhs (Patient taking differently: Take 400-600 mg by mouth See admin instructions. Take 450m in the morning and 6079mat bedtime.) 150 tablet 3   cetirizine (ZYRTEC) 10 MG tablet Can take one tablet by mouth once daily if needed for runny nose. (Patient taking differently: Take 10 mg by mouth daily as needed for rhinitis.) 30 tablet 5   Cholecalciferol (VITAMIN D) 125 MCG (5000 UT) CAPS Take 5,000 Units by mouth daily.     cyclobenzaprine (FLEXERIL) 5 MG tablet Take 5 mg by mouth 3 (three) times daily as needed for muscle spasms.     diclofenac Sodium (VOLTAREN) 1 % GEL Apply 4 g topically 4 (four) times daily. (Patient taking differently: Apply 4 g topically 4 (four) times daily as needed (pain).) 100 g 0   famotidine (PEPCID) 40 MG tablet Take 1 tablet (40 mg total) by mouth at bedtime. 30 tablet 3   ferrous sulfate 325 (65 FE) MG EC tablet Take 1 tablet (325 mg total) by mouth daily with breakfast. 90 tablet 1   fluticasone (FLONASE) 50 MCG/ACT nasal spray Place 1 spray into both nostrils 2  (two) times daily as needed for allergies or rhinitis. 16 g 5   gabapentin (NEURONTIN) 100 MG capsule Take 1 capsule (100 mg total) by mouth 3 (three) times daily. 90 capsule 3   hydrochlorothiazide (MICROZIDE) 12.5 MG capsule Take 1 capsule (12.5 mg total) by mouth daily. TAKE 1 CAPSULE(12.5 MG) BY MOUTH DAILY Strength: 12.5 mg (Patient taking differently: Take 12.5 mg by mouth daily.) 90 capsule 3   hydrOXYzine (VISTARIL) 25 MG capsule Take 1 capsule (25 mg total) by mouth every 6 (six) hours as needed for anxiety. 25 capsule 4   ipratropium-albuterol (DUONEB) 0.5-2.5 (3) MG/3ML SOLN Take 3 mLs by nebulization 2 (two) times daily. 360 mL 3   irbesartan (AVAPRO) 75 MG tablet TAKE 1 TABLET(75 MG) BY MOUTH  DAILY (Patient taking differently: Take 75 mg by mouth daily.) 90 tablet 1   ondansetron (ZOFRAN ODT) 4 MG disintegrating tablet Take 1 tablet (4 mg total) by mouth every 8 (eight) hours as needed for nausea or vomiting. 20 tablet 0   pantoprazole (PROTONIX) 40 MG tablet TAKE 1 TABLET BY MOUTH EVERY DAY (Patient taking differently: Take 40 mg by mouth daily.) 90 tablet 1   pantoprazole (PROTONIX) 40 MG tablet Take 1 tablet (40 mg total) by mouth 2 (two) times daily. 60 tablet 3   potassium chloride (KLOR-CON M) 10 MEQ tablet Take 1 tablet (10 mEq total) by mouth daily. 90 tablet 3   predniSONE (DELTASONE) 10 MG tablet Take 5 tablets (50 mg total) by mouth daily for 5 days, THEN 4 tablets (40 mg total) daily for 3 days, THEN 3 tablets (30 mg total) daily for 3 days, THEN 2 tablets (20 mg total) daily for 3 days, THEN 1 tablet (10 mg total) daily for 3 days. 47m x 3 days starting tomorrow, 465mx 3 days, 3013m 3 days, 58m83m3 days, 10mg66m days. 55 tablet 0   revefenacin (YUPELRI) 175 MCG/3ML nebulizer solution Take 3 mLs (175 mcg total) by nebulization daily. 90 mL 5   Skin Protectants, Misc. (EUCERIN) cream Apply 1 Application topically daily as needed for dry skin.     tacrolimus (PROTOPIC) 0.03 %  ointment Apply topically 2 (two) times daily. (Patient taking differently: Apply 1 Application topically 2 (two) times daily as needed (rash).) 100 g 0   triamcinolone ointment (KENALOG) 0.1 % Apply 1 Application topically 2 (two) times daily. 30 g 1   guaiFENesin-dextromethorphan (ROBITUSSIN DM) 100-10 MG/5ML syrup Take 10 mLs by mouth every 4 (four) hours as needed for cough. (Patient not taking: Reported on 01/22/2023) 118 mL 0   montelukast (SINGULAIR) 10 MG tablet Take 1 tablet (10 mg total) by mouth at bedtime. 90 tablet 1   Current Facility-Administered Medications on File Prior to Visit  Medication Dose Route Frequency Provider Last Rate Last Admin   dupilumab (DUPIXENT) prefilled syringe 300 mg  300 mg Subcutaneous Q14 Days GallaValentina Shaggy  300 mg at 01/17/23 1008        ROS:  All others reviewed and negative.  Objective        PE:  BP 126/78 (BP Location: Right Arm, Patient Position: Sitting, Cuff Size: Large)   Pulse 93   Temp 98 F (36.7 C) (Oral)   Ht 5' 3"$  (1.6 m)   Wt 192 lb (87.1 kg)   SpO2 96%   BMI 34.01 kg/m                 Constitutional: Pt appears in NAD, stil mild ill appearing               HENT: Head: NCAT.                Right Ear: External ear normal.                 Left Ear: External ear normal.                Eyes: . Pupils are equal, round, and reactive to light. Conjunctivae and EOM are normal               Nose: without d/c or deformity               Neck: Neck supple.  Gross normal ROM               Cardiovascular: Normal rate and regular rhythm.                 Pulmonary/Chest: Effort normal and breath sounds decresaed without rales but with few instp wheezing but mostly expiratory wheezing.                               Neurological: Pt is alert. At baseline orientation, motor grossly intact               Skin: Skin is warm. No rashes, no other new lesions, LE edema - none               Psychiatric: Pt behavior is normal without agitation    Micro: none  Cardiac tracings I have personally interpreted today:  none  Pertinent Radiological findings (summarize): none   Lab Results  Component Value Date   WBC 8.6 01/13/2023   HGB 11.6 (L) 01/13/2023   HCT 35.7 (L) 01/13/2023   PLT 310 01/13/2023   GLUCOSE 117 (H) 01/13/2023   CHOL 186 05/18/2022   TRIG 55.0 05/18/2022   HDL 96.00 05/18/2022   LDLCALC 79 05/18/2022   ALT 21 10/08/2022   AST 23 10/08/2022   NA 136 01/13/2023   K 4.0 01/13/2023   CL 100 01/13/2023   CREATININE 0.78 01/13/2023   BUN 26 (H) 01/13/2023   CO2 27 01/13/2023   TSH 1.16 09/13/2022   INR 0.9 02/06/2022   HGBA1C 6.5 09/13/2022   MICROALBUR 0.9 04/11/2021   Assessment/Plan:  [name] is a 62 y.o. Black or African American [2] female with  has a past medical history of Anemia, iron deficiency (12/22/2014), Arthritis, ASTHMA (05/12/2009), Bipolar disorder ([place]), COPD (chronic obstructive pulmonary disease) ([place]), Eczema (05/18/2022), Fibromyalgia (05/14/2014), GERD (gastroesophageal reflux disease), History of kidney stones, Hyperlipidemia (04/20/2017), HYPERTENSION (05/12/2009), Peripheral vascular disease ([place]), Pneumonia, Prediabetes (02/23/2014), Seizure ([place]), Stroke ([place]) (11/2020), and Urticaria.  Acute asthma exacerbation Mild persistent, for repeat depomedrl 80 mg IM, cont inhaler  Urinary incontinence Likely mild stress incontinence with cough, for UA r/o uti  Anxiety Chronic persistent stable, declines change in tx or referral at this time  Vitamin D deficiency Last vitamin D Lab Results  Component Value Date   VD25OH 29.50 (L) 09/13/2022   Low, to start oral replacement  Followup: Return if symptoms worsen or fail to improve.  Cathlean Cower, MD 01/27/2023 7:26 AM [facility]

## 2023-01-26 LAB — URINE CULTURE: Result:: NO GROWTH

## 2023-01-27 ENCOUNTER — Encounter: Payer: Self-pay | Admitting: Internal Medicine

## 2023-01-27 NOTE — Assessment & Plan Note (Signed)
Mild persistent, for repeat depomedrl 80 mg IM, cont inhaler

## 2023-01-27 NOTE — Assessment & Plan Note (Signed)
Last vitamin D Lab Results  Component Value Date   VD25OH 29.50 (L) 09/13/2022   Low, to start oral replacement

## 2023-01-27 NOTE — Assessment & Plan Note (Signed)
Likely mild stress incontinence with cough, for UA r/o uti

## 2023-01-27 NOTE — Assessment & Plan Note (Signed)
Chronic persistent stable, declines change in tx or referral at this time

## 2023-01-27 NOTE — Assessment & Plan Note (Signed)
>>  ASSESSMENT AND PLAN FOR URINARY INCONTINENCE WRITTEN ON 01/27/2023  7:23 AM BY Corwin Levins, MD  Likely mild stress incontinence with cough, for UA r/o uti

## 2023-01-31 ENCOUNTER — Ambulatory Visit: Payer: Self-pay

## 2023-01-31 DIAGNOSIS — F6381 Intermittent explosive disorder: Secondary | ICD-10-CM | POA: Diagnosis not present

## 2023-01-31 NOTE — Patient Outreach (Signed)
  Care Coordination   Follow Up Visit Note   01/31/2023 Name: [name] MRN: [id number] DOB: 04-14-1961  [name] is a 62 y.o. year old female who sees Biagio Borg, MD for primary care. I spoke with  [name] by phone today.  What matters to the patients health and wellness today? COPD symptom management. Patient reports has seen pulmonologist, and laryngopharyngeal and PCP. She reports she has an appointment with [facility] on 02/14/23.   Goals Addressed             This Visit's Progress    Support with Health managment education       Interventions Today    Flowsheet Row Most Recent Value  Chronic Disease   Chronic disease during today's visit Chronic Obstructive Pulmonary Disease (COPD), Other  [asthma]  General Interventions   General Interventions Discussed/Reviewed General Interventions Reviewed, Doctor Visits  Doctor Visits Discussed/Reviewed Doctor Visits Reviewed  PCP/Specialist Visits Compliance with follow-up visit  Education Interventions   Education Provided Provided Education, Provided Web-based Education  [Emmi: What is COPD,  COPD: what patients can do,  discussed importance of identifying and removing allergens in her environment]  Provided Verbal Education On When to see the doctor, Medication  Pharmacy Interventions   Pharmacy Dicussed/Reviewed Medications and their functions           SDOH assessments and interventions completed:  No     Care Coordination Interventions:  Yes, provided   Follow up plan: Follow up call scheduled for 03/07/23    Encounter Outcome:  Pt. Visit Completed   Thea Silversmith, RN, MSN, BSN, [place] Coordinator [phone]

## 2023-01-31 NOTE — Patient Instructions (Addendum)
Visit Information  Thank you for taking time to visit with me today. Please don't hesitate to contact me if I can be of assistance to you.   Following are the goals we discussed today:   Goals Addressed             This Visit's Progress    Support with Health managment education       Interventions Today    Flowsheet Row Most Recent Value  Chronic Disease   Chronic disease during today's visit Chronic Obstructive Pulmonary Disease (COPD), Other  [asthma]  General Interventions   General Interventions Discussed/Reviewed General Interventions Reviewed, Doctor Visits  Doctor Visits Discussed/Reviewed Doctor Visits Reviewed  PCP/Specialist Visits Compliance with follow-up visit  Education Interventions   Education Provided Provided Education, Provided Web-based Education  [Emmi: What is COPD,  COPD: what patients can do,  discussed importance of identifying and removing allergens in her environment]  Provided Verbal Education On When to see the doctor, Medication  Pharmacy Interventions   Pharmacy Dicussed/Reviewed Medications and their functions           Our next appointment is by telephone on 03/07/23 at 11:00 am  Please call the care guide team at [phone] if you need to cancel or reschedule your appointment.   If you are experiencing a Mental Health or [place] or need someone to talk to, please call the Suicide and Crisis Lifeline: [place], RN, MSN, BSN, CCM [place] [phone]   COPD Action Plan A COPD action plan is a description of what to do when you have a flare (exacerbation) of chronic obstructive pulmonary disease (COPD). Your action plan is a color-coded plan that lists the symptoms that indicate whether your condition is under control and what actions to take. If you have symptoms in the green zone, it means you are doing well that day. If you have symptoms in the yellow zone, it means you are having a bad day or an  exacerbation. If you have symptoms in the red zone, you need urgent medical care. Follow the plan that you and your health care provider developed. Review your plan with your health care provider at each visit. Red zone Symptoms in this zone mean that you should get medical help right away. They include: Feeling very short of breath, even when you are resting. Not being able to do any activities because of poor breathing. Not being able to sleep because of poor breathing. Fever or shaking chills. Feeling confused or very sleepy. Chest pain. Coughing up blood. If you have any of these symptoms, call emergency services (911 in the [place] or go to the nearest emergency room. Yellow zone Symptoms in this zone mean that your condition may be getting worse. They include: Feeling more short of breath than usual. Having less energy for daily activities than usual. Phlegm or mucus that is thicker than usual. Needing to use your rescue inhaler or nebulizer more often than usual. More ankle swelling than usual. Coughing more than usual. Feeling like you have a chest cold. Trouble sleeping due to COPD symptoms. Decreased appetite. COPD medicines not helping as much as usual. If you experience any "yellow" symptoms: Keep taking your daily medicines as directed. Use your quick-relief inhaler as told by your health care provider. If you were prescribed steroid medicine to take by mouth (oral medicine), start taking it as told by your health care provider. If you were prescribed an antibiotic medicine, start taking  it as told by your health care provider. Do not stop taking the antibiotic even if you start to feel better. Use oxygen as told by your health care provider. Get more rest. Do your pursed-lip breathing exercises. Do not smoke. Avoid any irritants in the air. If your signs and symptoms do not improve after taking these steps, call your health care provider right away. Green zone Symptoms  in this zone mean that you are doing well. They include: Being able to do your usual activities and exercise. Having the usual amount of coughing, including the same amount of phlegm or mucus. Being able to sleep well. Having a good appetite. Where to find more information: You can find more information about COPD from: American Lung Association, My COPD Action Plan: www.lung.org COPD Foundation: www.copdfoundation.[place]: https://wilson-[place].com/ Follow these instructions at home: Continue taking your daily medicines as told by your health care provider. Make sure you receive all the immunizations that your health care provider recommends, especially the pneumococcal and influenza vaccines. Wash your hands often with soap and water. Have family members wash their hands too. Regular hand washing can help prevent infections. Follow your usual exercise and diet plan. Avoid irritants in the air, such as smoke. Do not use any products that contain nicotine or tobacco. These products include cigarettes, chewing tobacco, and vaping devices, such as e-cigarettes. If you need help quitting, ask your health care provider. Summary A COPD action plan tells you what to do when you have a flare (exacerbation) of chronic obstructive pulmonary disease (COPD). Follow each action plan for your symptoms. If you have any symptoms in the red zone, call emergency services (911 in the [place] or go to the nearest emergency room. This information is not intended to replace advice given to you by your health care provider. Make sure you discuss any questions you have with your health care provider. Document Revised: 10/11/2020 Document Reviewed: 10/11/2020 Elsevier Patient Education  [place].

## 2023-02-06 ENCOUNTER — Ambulatory Visit (HOSPITAL_BASED_OUTPATIENT_CLINIC_OR_DEPARTMENT_OTHER): Payer: Medicare HMO | Admitting: Psychiatry

## 2023-02-06 VITALS — BP 103/65 | HR 83 | Resp 20 | Ht 67.0 in | Wt 133.8 lb

## 2023-02-06 DIAGNOSIS — F3110 Bipolar disorder, current episode manic without psychotic features, unspecified: Secondary | ICD-10-CM | POA: Diagnosis not present

## 2023-02-06 DIAGNOSIS — Z79899 Other long term (current) drug therapy: Secondary | ICD-10-CM

## 2023-02-06 DIAGNOSIS — Z5181 Encounter for therapeutic drug level monitoring: Secondary | ICD-10-CM

## 2023-02-06 MED ORDER — HYDROXYZINE PAMOATE 25 MG PO CAPS: 25.0000 mg | ORAL_CAPSULE | Freq: Four times a day (QID) | ORAL | 4 refills | Status: DC | PRN

## 2023-02-06 MED ORDER — CARBAMAZEPINE 200 MG PO TABS: 400.0000 mg | ORAL_TABLET | ORAL | 2 refills | Status: DC
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
## 2023-02-11 NOTE — Telephone Encounter (Signed)
Called patient - DOB verified - advised of provider notation - letter has been ready for p/u in [address].  Patient stated she will p/u letter at her appt. 02/14/23 w/ Ernst Bowler.

## 2023-02-13 DIAGNOSIS — F6381 Intermittent explosive disorder: Secondary | ICD-10-CM | POA: Diagnosis not present

## 2023-02-14 ENCOUNTER — Ambulatory Visit (INDEPENDENT_AMBULATORY_CARE_PROVIDER_SITE_OTHER): Payer: Medicare HMO

## 2023-02-14 DIAGNOSIS — J4550 Severe persistent asthma, uncomplicated: Secondary | ICD-10-CM | POA: Diagnosis not present

## 2023-02-18 DIAGNOSIS — R32 Unspecified urinary incontinence: Secondary | ICD-10-CM | POA: Diagnosis not present

## 2023-02-21 ENCOUNTER — Ambulatory Visit: Payer: Medicare HMO | Admitting: Orthopaedic Surgery

## 2023-02-21 ENCOUNTER — Other Ambulatory Visit: Payer: Self-pay

## 2023-02-21 ENCOUNTER — Inpatient Hospital Stay (HOSPITAL_COMMUNITY)
Admission: EM | Admit: 2023-02-21 | Discharge: 2023-02-27 | DRG: 190 | Disposition: A | Discharge status: Home / Self Care | Payer: Medicare HMO | Attending: Internal Medicine | Admitting: Internal Medicine

## 2023-02-21 ENCOUNTER — Emergency Department (HOSPITAL_COMMUNITY): Payer: Medicare HMO

## 2023-02-21 ENCOUNTER — Encounter (HOSPITAL_COMMUNITY): Payer: Self-pay | Admitting: Emergency Medicine

## 2023-02-21 DIAGNOSIS — R0602 Shortness of breath: Secondary | ICD-10-CM | POA: Diagnosis not present

## 2023-02-21 DIAGNOSIS — G40909 Epilepsy, unspecified, not intractable, without status epilepticus: Secondary | ICD-10-CM | POA: Diagnosis present

## 2023-02-21 DIAGNOSIS — Z841 Family history of disorders of kidney and ureter: Secondary | ICD-10-CM

## 2023-02-21 DIAGNOSIS — Z8673 Personal history of transient ischemic attack (TIA), and cerebral infarction without residual deficits: Secondary | ICD-10-CM

## 2023-02-21 DIAGNOSIS — J432 Centrilobular emphysema: Secondary | ICD-10-CM | POA: Diagnosis not present

## 2023-02-21 DIAGNOSIS — J9612 Chronic respiratory failure with hypercapnia: Secondary | ICD-10-CM | POA: Diagnosis not present

## 2023-02-21 DIAGNOSIS — M351 Other overlap syndromes: Secondary | ICD-10-CM | POA: Diagnosis present

## 2023-02-21 DIAGNOSIS — J9600 Acute respiratory failure, unspecified whether with hypoxia or hypercapnia: Secondary | ICD-10-CM | POA: Diagnosis not present

## 2023-02-21 DIAGNOSIS — R051 Acute cough: Secondary | ICD-10-CM

## 2023-02-21 DIAGNOSIS — R739 Hyperglycemia, unspecified: Secondary | ICD-10-CM | POA: Diagnosis present

## 2023-02-21 DIAGNOSIS — Z6834 Body mass index (BMI) 34.0-34.9, adult: Secondary | ICD-10-CM

## 2023-02-21 DIAGNOSIS — M797 Fibromyalgia: Secondary | ICD-10-CM | POA: Diagnosis present

## 2023-02-21 DIAGNOSIS — Z7951 Long term (current) use of inhaled steroids: Secondary | ICD-10-CM

## 2023-02-21 DIAGNOSIS — Z8249 Family history of ischemic heart disease and other diseases of the circulatory system: Secondary | ICD-10-CM | POA: Diagnosis not present

## 2023-02-21 DIAGNOSIS — Z882 Allergy status to sulfonamides status: Secondary | ICD-10-CM

## 2023-02-21 DIAGNOSIS — J45901 Unspecified asthma with (acute) exacerbation: Secondary | ICD-10-CM

## 2023-02-21 DIAGNOSIS — Z1152 Encounter for screening for COVID-19: Secondary | ICD-10-CM

## 2023-02-21 DIAGNOSIS — K219 Gastro-esophageal reflux disease without esophagitis: Secondary | ICD-10-CM | POA: Diagnosis present

## 2023-02-21 DIAGNOSIS — I739 Peripheral vascular disease, unspecified: Secondary | ICD-10-CM | POA: Diagnosis present

## 2023-02-21 DIAGNOSIS — Z825 Family history of asthma and other chronic lower respiratory diseases: Secondary | ICD-10-CM

## 2023-02-21 DIAGNOSIS — D509 Iron deficiency anemia, unspecified: Secondary | ICD-10-CM | POA: Diagnosis present

## 2023-02-21 DIAGNOSIS — F319 Bipolar disorder, unspecified: Secondary | ICD-10-CM | POA: Diagnosis present

## 2023-02-21 DIAGNOSIS — H1010 Acute atopic conjunctivitis, unspecified eye: Secondary | ICD-10-CM

## 2023-02-21 DIAGNOSIS — J9621 Acute and chronic respiratory failure with hypoxia: Secondary | ICD-10-CM | POA: Diagnosis present

## 2023-02-21 DIAGNOSIS — Z803 Family history of malignant neoplasm of breast: Secondary | ICD-10-CM

## 2023-02-21 DIAGNOSIS — Z87442 Personal history of urinary calculi: Secondary | ICD-10-CM

## 2023-02-21 DIAGNOSIS — K2100 Gastro-esophageal reflux disease with esophagitis, without bleeding: Secondary | ICD-10-CM

## 2023-02-21 DIAGNOSIS — K7460 Unspecified cirrhosis of liver: Secondary | ICD-10-CM | POA: Diagnosis present

## 2023-02-21 DIAGNOSIS — I5031 Acute diastolic (congestive) heart failure: Secondary | ICD-10-CM | POA: Diagnosis not present

## 2023-02-21 DIAGNOSIS — Z833 Family history of diabetes mellitus: Secondary | ICD-10-CM

## 2023-02-21 DIAGNOSIS — J439 Emphysema, unspecified: Secondary | ICD-10-CM | POA: Diagnosis present

## 2023-02-21 DIAGNOSIS — Z981 Arthrodesis status: Secondary | ICD-10-CM

## 2023-02-21 DIAGNOSIS — Z888 Allergy status to other drugs, medicaments and biological substances status: Secondary | ICD-10-CM

## 2023-02-21 DIAGNOSIS — Z87891 Personal history of nicotine dependence: Secondary | ICD-10-CM | POA: Diagnosis not present

## 2023-02-21 DIAGNOSIS — E669 Obesity, unspecified: Secondary | ICD-10-CM | POA: Diagnosis present

## 2023-02-21 DIAGNOSIS — J329 Chronic sinusitis, unspecified: Secondary | ICD-10-CM

## 2023-02-21 DIAGNOSIS — Z808 Family history of malignant neoplasm of other organs or systems: Secondary | ICD-10-CM | POA: Diagnosis not present

## 2023-02-21 DIAGNOSIS — Z811 Family history of alcohol abuse and dependence: Secondary | ICD-10-CM | POA: Diagnosis not present

## 2023-02-21 DIAGNOSIS — I2510 Atherosclerotic heart disease of native coronary artery without angina pectoris: Secondary | ICD-10-CM | POA: Diagnosis present

## 2023-02-21 DIAGNOSIS — I10 Essential (primary) hypertension: Secondary | ICD-10-CM | POA: Diagnosis present

## 2023-02-21 DIAGNOSIS — R519 Headache, unspecified: Secondary | ICD-10-CM | POA: Diagnosis present

## 2023-02-21 DIAGNOSIS — R0603 Acute respiratory distress: Secondary | ICD-10-CM | POA: Diagnosis not present

## 2023-02-21 DIAGNOSIS — R531 Weakness: Secondary | ICD-10-CM | POA: Diagnosis present

## 2023-02-21 DIAGNOSIS — J441 Chronic obstructive pulmonary disease with (acute) exacerbation: Principal | ICD-10-CM

## 2023-02-21 DIAGNOSIS — E785 Hyperlipidemia, unspecified: Secondary | ICD-10-CM | POA: Diagnosis present

## 2023-02-21 DIAGNOSIS — J9809 Other diseases of bronchus, not elsewhere classified: Secondary | ICD-10-CM | POA: Diagnosis not present

## 2023-02-21 DIAGNOSIS — Z90710 Acquired absence of both cervix and uterus: Secondary | ICD-10-CM

## 2023-02-21 DIAGNOSIS — Z79899 Other long term (current) drug therapy: Secondary | ICD-10-CM

## 2023-02-21 DIAGNOSIS — J9611 Chronic respiratory failure with hypoxia: Secondary | ICD-10-CM | POA: Diagnosis present

## 2023-02-21 DIAGNOSIS — Z800 Family history of malignant neoplasm of digestive organs: Secondary | ICD-10-CM

## 2023-02-21 LAB — I-STAT VENOUS BLOOD GAS, ED
Acid-Base Excess: 3 mmol/L — ABNORMAL HIGH (ref 0.0–2.0)
Bicarbonate: 28.1 mmol/L — ABNORMAL HIGH (ref 20.0–28.0)
Calcium, Ion: 1.16 mmol/L (ref 1.15–1.40)
HCT: 29 % — ABNORMAL LOW (ref 36.0–46.0)
Hemoglobin: 9.9 g/dL — ABNORMAL LOW (ref 12.0–15.0)
O2 Saturation: 65 %
Potassium: 3.4 mmol/L — ABNORMAL LOW (ref 3.5–5.1)
Sodium: 142 mmol/L (ref 135–145)
TCO2: 29 mmol/L (ref 22–32)
pCO2, Ven: 44.2 mmHg (ref 44–60)
pH, Ven: 7.412 (ref 7.25–7.43)
pO2, Ven: 33 mmHg (ref 32–45)

## 2023-02-21 LAB — CBC WITH DIFFERENTIAL/PLATELET
Abs Immature Granulocytes: 0.07 10*3/uL (ref 0.00–0.07)
Basophils Absolute: 0 10*3/uL (ref 0.0–0.1)
Basophils Relative: 0 %
Eosinophils Absolute: 0.1 10*3/uL (ref 0.0–0.5)
Eosinophils Relative: 1 %
HCT: 31.6 % — ABNORMAL LOW (ref 36.0–46.0)
Hemoglobin: 10.3 g/dL — ABNORMAL LOW (ref 12.0–15.0)
Immature Granulocytes: 1 %
Lymphocytes Relative: 27 %
Lymphs Abs: 3.2 10*3/uL (ref 0.7–4.0)
MCH: 31.6 pg (ref 26.0–34.0)
MCHC: 32.6 g/dL (ref 30.0–36.0)
MCV: 96.9 fL (ref 80.0–100.0)
Monocytes Absolute: 1.1 10*3/uL — ABNORMAL HIGH (ref 0.1–1.0)
Monocytes Relative: 9 %
Neutro Abs: 7.5 10*3/uL (ref 1.7–7.7)
Neutrophils Relative %: 62 %
Platelets: 329 10*3/uL (ref 150–400)
RBC: 3.26 MIL/uL — ABNORMAL LOW (ref 3.87–5.11)
RDW: 15.7 % — ABNORMAL HIGH (ref 11.5–15.5)
WBC: 12.1 10*3/uL — ABNORMAL HIGH (ref 4.0–10.5)
nRBC: 0 % (ref 0.0–0.2)

## 2023-02-21 LAB — COMPREHENSIVE METABOLIC PANEL
ALT: 21 U/L (ref 0–44)
AST: 24 U/L (ref 15–41)
Albumin: 3.3 g/dL — ABNORMAL LOW (ref 3.5–5.0)
Alkaline Phosphatase: 80 U/L (ref 38–126)
Anion gap: 9 (ref 5–15)
BUN: 7 mg/dL (ref 6–20)
CO2: 27 mmol/L (ref 22–32)
Calcium: 8.8 mg/dL — ABNORMAL LOW (ref 8.9–10.3)
Chloride: 105 mmol/L (ref 98–111)
Creatinine, Ser: 0.68 mg/dL (ref 0.44–1.00)
GFR, Estimated: >60 mL/min (ref >60)
Glucose, Bld: 171 mg/dL — ABNORMAL HIGH (ref 70–99)
Potassium: 3.6 mmol/L (ref 3.5–5.1)
Sodium: 141 mmol/L (ref 135–145)
Total Bilirubin: 0.3 mg/dL (ref 0.3–1.2)
Total Protein: 5.7 g/dL — ABNORMAL LOW (ref 6.5–8.1)

## 2023-02-21 LAB — CBG MONITORING, ED: Glucose-Capillary: 162 mg/dL — ABNORMAL HIGH (ref 70–99)

## 2023-02-21 LAB — MAGNESIUM: Magnesium: 3 mg/dL — ABNORMAL HIGH (ref 1.7–2.4)

## 2023-02-21 MED ORDER — IPRATROPIUM-ALBUTEROL 0.5-2.5 (3) MG/3ML IN SOLN
3.0000 mL | Freq: Once | RESPIRATORY_TRACT | Status: DC
  Filled 2023-02-21: qty 3

## 2023-02-21 MED ORDER — INSULIN ASPART 100 UNIT/ML IJ SOLN
0.0000 [IU] | Freq: Three times a day (TID) | INTRAMUSCULAR | Status: DC
  Administered 2023-02-22 – 2023-02-24 (×5): 2 [IU] via SUBCUTANEOUS
  Administered 2023-02-25: 3 [IU] via SUBCUTANEOUS
  Administered 2023-02-25: 2 [IU] via SUBCUTANEOUS
  Administered 2023-02-26 (×2): 5 [IU] via SUBCUTANEOUS
  Administered 2023-02-26: 2 [IU] via SUBCUTANEOUS

## 2023-02-21 MED ORDER — INSULIN ASPART 100 UNIT/ML IJ SOLN
0.0000 [IU] | Freq: Every day | INTRAMUSCULAR | Status: DC
  Administered 2023-02-25: 2 [IU] via SUBCUTANEOUS

## 2023-02-21 MED ORDER — METHYLPREDNISOLONE SODIUM SUCC 125 MG IJ SOLR: 125.0000 mg | Freq: Once | INTRAMUSCULAR | Status: DC

## 2023-02-21 MED ORDER — SODIUM CHLORIDE 0.9 % IV SOLN
500.0000 mg | Freq: Once | INTRAVENOUS | Status: AC
  Administered 2023-02-21: 500 mg via INTRAVENOUS
  Filled 2023-02-21: qty 5

## 2023-02-21 MED ORDER — MAGNESIUM SULFATE 2 GM/50ML IV SOLN: 2.0000 g | Freq: Once | INTRAVENOUS | Status: DC

## 2023-02-21 MED ORDER — IPRATROPIUM-ALBUTEROL 0.5-2.5 (3) MG/3ML IN SOLN
3.0000 mL | Freq: Once | RESPIRATORY_TRACT | Status: AC
  Administered 2023-02-21: 3 mL via RESPIRATORY_TRACT
  Filled 2023-02-21: qty 3

## 2023-02-21 NOTE — Progress Notes (Signed)
Patient weaned off BiPAP to RA.  VSS.  RT will continue to monitor.

## 2023-02-21 NOTE — H&P (Addendum)
PCP:   Biagio Borg, MD   Chief Complaint:  Shortness of breath.  HPI: This is a 62 year old female with past medical history of COPD, hypertension, bipolar, fibromyalgia.  Patient recently admitted in January with influenza, COPD exacerbation and respiratory failure.  Per patient since discharge she has not been well.  She has been back-and-forth between her pulmonologist and her PCP without improvement.  Per patient she remains quite dyspneic and short of breath.  Walking from 1 room to the other side she is very short of breath.  Her shortness of breath was significant prior however she managed, not anymore.  She endorses a chronic wheeze which is getting progressively worse.  She states anything will trigger her wheezing or shortness of breath.  She cites fumes, colognes, vehicle fumes, and history, perfume, smoke.  She states she is not even able to shop for groceries because of the effects of different scents.  She is not able to cook.  She states when she becomes a short of breath she has become incontinent.  This occurred the last time she went grocery shopping.  She denies fevers or chills.  She endorses a headache which she states started in the ER after an employee came in with a perfume on.  She denies fevers or chills.  She was brought to the ER because of her worsening shortness of breath.  In the ambulance, patient given steroids and nebs.  Her lungs were quite without wheeze in the ER but patient with shortness of breath.  She had increased work of breathing.  She was placed on the BiPAP, patient appeared more comfortable, her work of breathing decreased.  Admission requested   Review of Systems:  The patient denies anorexia, fever, weight loss, vision loss, decreased hearing, hoarseness, chest pain, syncope, peripheral edema, balance deficits, hemoptysis, abdominal pain, melena, hematochezia, severe indigestion/heartburn, hematuria, incontinence, genital sores, muscle weakness,  suspicious skin lesions, transient blindness, difficulty walking, depression, unusual weight change, abnormal bleeding, enlarged lymph nodes, angioedema, and breast masses. Positives: Dyspnea, wheeze, cough, incontinence  Past Medical History: Past Medical History:  Diagnosis Date   Anemia, iron deficiency 12/22/2014   pt. 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      Bipolar disorder ([place])    with anxiety, depression   COPD (chronic obstructive pulmonary disease) ([place])    Eczema 05/18/2022   Fibromyalgia 05/14/2014   GERD (gastroesophageal reflux disease)    History of kidney stones    Hyperlipidemia 04/20/2017   HYPERTENSION 05/12/2009   Qualifier: Diagnosis of  By: Lenn Cal Deborra Medina), [name]     Peripheral vascular disease ([place])    Pneumonia    Prediabetes 02/23/2014   pt. denies   Seizure ([place])    Stroke ([place]) 11/2020   Urticaria    Past Surgical History:  Procedure Laterality Date   ABDOMINAL HYSTERECTOMY     ANTERIOR CERVICAL DECOMP/DISCECTOMY FUSION N/A 07/28/2020   Procedure: ANTERIOR CERVICAL DECOMPRESSION/DISCECTOMY FUSION. INTERBODY PROTHESIS, PLATE/SCREWS CERVICAL THREE- CERVICAL FOUR, CERVICAL FOUR- CERVICAL FIVE;  Surgeon: Newman Pies, MD;  Location: [facility];  Service: Neurosurgery;  Laterality: N/A;   BACK SURGERY     COLONOSCOPY  12/20/2011   Procedure: COLONOSCOPY;  Surgeon: Landry Dyke, MD;  Location: [facility];  Service: Endoscopy;  Laterality: N/A;   COLONOSCOPY  03/05/2012   Procedure: COLONOSCOPY;  Surgeon: Landry Dyke, MD;  Location: [facility];  Service: Endoscopy;  Laterality: N/A;   DIAGNOSTIC LAPAROSCOPY     HEMORRHOID SURGERY     INCISE AND DRAIN ABCESS     KIDNEY STONE SURGERY     NECK SURGERY     x 2 Dr Orinda Kenner   SPINE SURGERY  2019   TOE  SURGERY     TUBAL LIGATION      Medications: Prior to Admission medications   Medication Sig Start Date End Date Taking? Authorizing Provider  albuterol (VENTOLIN HFA) 108 (90 Base) MCG/ACT inhaler Inhale 2 puffs into the lungs every 6 (six) hours as needed for wheezing or shortness of breath. 10/18/22   Valentina Shaggy, MD  atorvastatin (LIPITOR) 40 MG tablet Take 1 tablet (40 mg total) by mouth daily. TAKE 1 TABLET(40 MG) BY MOUTH DAILY Strength: 40 mg Patient taking differently: Take 40 mg by mouth daily. 05/23/22   Biagio Borg, MD  azelastine (OPTIVAR) 0.05 % ophthalmic solution Place 1 drop into both eyes 2 (two) times daily. 05/18/22   Biagio Borg, MD  BELSOMRA 15 MG TABS Take 15 mg by mouth at bedtime as needed (sleep). 05/24/22   [provider]  BREZTRI AEROSPHERE 160-9-4.8 MCG/ACT AERO Inhale 2 puffs into the lungs in the morning and at bedtime. 11/27/22   [provider]  butalbital-acetaminophen-caffeine (FIORICET) 50-325-40 MG tablet 1 tab by mouth once daily as needed for HA, limit 14 per month 09/17/22   Biagio Borg, MD  carbamazepine (TEGRETOL) 200 MG tablet Take 2-3 tablets (400-600 mg total) by mouth See admin instructions. Take '400mg'$  in the morning and '600mg'$  at bedtime. 02/06/23   Plovsky, Berneta Sages, MD  cetirizine (ZYRTEC) 10 MG tablet Can take one tablet by mouth once daily if needed for runny nose. Patient taking differently: Take 10 mg by mouth daily as needed for rhinitis. 04/28/21   Dara Hoyer, FNP  Cholecalciferol (VITAMIN D) 125 MCG (5000 UT) CAPS Take 5,000 Units by mouth daily.    [provider]  cyclobenzaprine (FLEXERIL) 5 MG tablet Take 5 mg by mouth 3 (three) times daily as needed for muscle spasms. 10/25/22   [provider]  diclofenac Sodium (VOLTAREN) 1 % GEL Apply 4 g topically 4 (four) times daily. Patient taking differently: Apply 4 g topically 4 (four) times daily as needed (pain). 06/04/20   Deno Etienne, DO  famotidine  (PEPCID) 40 MG tablet Take 1 tablet (40 mg total) by mouth at bedtime. 01/22/23   Martyn Ehrich, NP  ferrous sulfate 325 (65 FE) MG EC tablet Take 1 tablet (325 mg total) by mouth daily with breakfast. 09/14/22   Biagio Borg, MD  fluticasone [facility]) 50 MCG/ACT nasal spray Place 1 spray into both nostrils 2 (two) times daily as needed for allergies or rhinitis. 10/19/22   Valentina Shaggy, MD  gabapentin (NEURONTIN) 100 MG capsule Take 1 capsule (100 mg total) by mouth 3 (three) times daily. 12/21/22   Plovsky, Berneta Sages, MD  guaiFENesin-dextromethorphan (ROBITUSSIN DM) 100-10 MG/5ML syrup Take 10 mLs by mouth every 4 (four) hours as needed for cough. Patient not taking: Reported on 01/22/2023 01/15/23   Hosie Poisson, MD  hydrochlorothiazide (MICROZIDE) 12.5 MG capsule Take 1 capsule (12.5 mg total) by mouth daily. TAKE 1 CAPSULE(12.5 MG) BY MOUTH DAILY Strength: 12.5 mg Patient taking differently: Take 12.5 mg by mouth daily. 05/23/22   Biagio Borg,  MD  hydrOXYzine (VISTARIL) 25 MG capsule Take 1 capsule (25 mg total) by mouth every 6 (six) hours as needed for anxiety. 02/06/23   Plovsky, Berneta Sages, MD  ipratropium-albuterol (DUONEB) 0.5-2.5 (3) MG/3ML SOLN Take 3 mLs by nebulization 2 (two) times daily. 01/15/23   Hosie Poisson, MD  irbesartan (AVAPRO) 75 MG tablet TAKE 1 TABLET(75 MG) BY MOUTH DAILY Patient taking differently: Take 75 mg by mouth daily. 11/06/21   Biagio Borg, MD  montelukast (SINGULAIR) 10 MG tablet Take 1 tablet (10 mg total) by mouth at bedtime. 10/18/22 01/16/23  Valentina Shaggy, MD  ondansetron (ZOFRAN ODT) 4 MG disintegrating tablet Take 1 tablet (4 mg total) by mouth every 8 (eight) hours as needed for nausea or vomiting. 05/19/21   Valentina Shaggy, MD  pantoprazole (PROTONIX) 40 MG tablet TAKE 1 TABLET BY MOUTH EVERY DAY Patient taking differently: Take 40 mg by mouth daily. 09/17/22   Biagio Borg, MD  pantoprazole (PROTONIX) 40 MG tablet Take 1 tablet (40 mg  total) by mouth 2 (two) times daily. 01/22/23   Martyn Ehrich, NP  potassium chloride (KLOR-CON M) 10 MEQ tablet Take 1 tablet (10 mEq total) by mouth daily. 05/23/22   Biagio Borg, MD  revefenacin (YUPELRI) 175 MCG/3ML nebulizer solution Take 3 mLs (175 mcg total) by nebulization daily. 10/18/22   Valentina Shaggy, MD  Skin Protectants, Misc. (EUCERIN) cream Apply 1 Application topically daily as needed for dry skin.    [provider]  tacrolimus (PROTOPIC) 0.03 % ointment Apply topically 2 (two) times daily. Patient taking differently: Apply 1 Application topically 2 (two) times daily as needed (rash). 08/02/22   Valentina Shaggy, MD  triamcinolone ointment (KENALOG) 0.1 % Apply 1 Application topically 2 (two) times daily. 10/25/22   Dara Hoyer, FNP    Allergies:   Allergies  Allergen Reactions   Sulfa Antibiotics Nausea And Vomiting   Venofer [Iron Sucrose] Hives    Social History:  reports that she quit smoking about 4 years ago. Her smoking use included cigarettes. She has a 9.00 pack-year smoking history. She has never used smokeless tobacco. She reports that she does not currently use alcohol. She reports that she does not currently use drugs after having used the following drugs: Marijuana.  Family History: Family History  Problem Relation Age of Onset   Diabetes Mother    COPD Mother    Heart disease Mother    Asthma Mother    Diabetes Father    Kidney disease Father    Anesthesia problems Father    Alcohol abuse Father    Colon cancer Father 55   Diabetes Sister    Hypertension Sister    Heart disease Sister    Diabetes Sister    Brain cancer Sister    Hypertension Sister    Asthma Sister    Diabetes Sister    COPD Sister    Hypertension Sister    Breast cancer Sister 1   Anemia Sister    Hypertension Sister    Diabetes Sister    Anemia Sister    Hypertension Sister    Diabetes Brother    Sleep apnea Brother    Asthma Brother     Alcohol abuse Brother    Heart disease Brother    Heart disease Brother    Hypertension Brother    Hypertension Daughter    Anemia Daughter    Allergic rhinitis Neg Hx    Eczema Neg  Hx    Urticaria Neg Hx    Esophageal cancer Neg Hx    Prostate cancer Neg Hx    Rectal cancer Neg Hx     Physical Exam: Vitals:   02/21/23 2058 02/21/23 2100 02/21/23 2125 02/21/23 2200  BP:    124/83  Pulse:   84 82  Resp:   19 16  Temp:  98.3 F (36.8 C)    TempSrc:  Oral    SpO2:   100% 100%  Weight: 87.5 kg     Height: '5\' 7"'$  (1.702 m)       General:  Alert and oriented times three, well developed and nourished, no acute distress Eyes: PERRLA, pink conjunctiva, no scleral icterus ENT: Moist oral mucosa, neck supple, no thyromegaly Lungs: silent lung, no air echangem wheeze, no rattle, no wheeze, no crackles, no use of accessory muscles Cardiovascular: regular rate and rhythm, no regurgitation, no gallops, no murmurs. No carotid bruits, no JVD Abdomen: soft, positive BS, non-tender, non-distended, no organomegaly, not an acute abdomen GU: not examined Neuro: CN II - XII grossly intact, sensation intact Musculoskeletal: strength 5/5 all extremities, no clubbing, cyanosis or edema Skin: no rash, no subcutaneous crepitation, no decubitus Psych: appropriate patient   Labs on Admission:  Recent Labs    02/21/23 2100 02/21/23 2126  NA 141 142  K 3.6 3.4*  CL 105  --   CO2 27  --   GLUCOSE 171*  --   BUN 7  --   CREATININE 0.68  --   CALCIUM 8.8*  --   MG 3.0*  --    Recent Labs    02/21/23 2100  AST 24  ALT 21  ALKPHOS 80  BILITOT 0.3  PROT 5.7*  ALBUMIN 3.3*    Recent Labs    02/21/23 2100 02/21/23 2126  WBC 12.1*  --   NEUTROABS 7.5  --   HGB 10.3* 9.9*  HCT 31.6* 29.0*  MCV 96.9  --   PLT 329  --     Radiological Exams on Admission: No results found.  Assessment/Plan Present on Admission:  Acute exacerbation of COPD(HCC)  Chronic respiratory failure with  hypoxia and hypercapnia (HCC) -COPD order set initiated -Scheduled albuterol, as needed DuoNebs -Prednisone 40 mg daily, first dose now -Oxygen to keep sats greater than 88% -Resume Singulair -Respiratory consult placed -Mucinex DM twice daily ordered -CT chest ordered, azithromycin p.o. ordered   Essential (primary) hypertension -Stable, Avapro, HCTZ resumed   Hyperlipidemia -Atorvastatin resumed   GERD -Protonix and Pepcid resumed   Bipolar -Tegretol resumed, level ordered   Hyperglycemia -Sliding scale initiated  Michaeleen Down 02/21/2023, 10:45 PM

## 2023-02-21 NOTE — ED Provider Notes (Signed)
Supervised resident visit.  Patient here with shortness of breath.  History of COPD.  Patient also with history of hypertension.  No history of heart failure.  She is got diminished breath sounds throughout with increased work of breathing.  She is ready gotten DuoNeb treatment, magnesium and steroids with EMS.  She will be switched over to BiPAP to help with her work of breathing.  Blood gas was obtained that showed no significant acidosis.  CO2 is within normal limits.  Her work of breathing has improved greatly on BiPAP and she feels much more comfortable.  EKG shows sinus rhythm.  No ischemic changes.  She not having any chest pain.  Have no concern for PE.  Overall lab works unremarkable.  My suspicion is for COPD exacerbation.  Chest x-ray shows no evidence of pneumonia.  Overall we will admit to medicine for further care.  This chart was dictated using voice recognition software.  Despite best efforts to proofread,  errors can occur which can change the documentation meaning.    Lennice Sites, DO 02/21/23 2214

## 2023-02-21 NOTE — ED Triage Notes (Signed)
Pt bib [facility] for shortness of breath and weakness for the past two days. Hx of copd and admitted for the same recently. '10mg'$  albuterol, .'5mg'$  atrovent, 2g mag, '125mg'$  solumedrol given PTA. VSS per ems.

## 2023-02-21 NOTE — Progress Notes (Signed)
Patient placed on BiPAP per verbal order.

## 2023-02-21 NOTE — ED Provider Notes (Signed)
[place] Provider Note   CSN: [id number] Arrival date & time: 02/21/23  2054     History  Chief Complaint  Patient presents with   Shortness of Breath    [name] is a 62 y.o. female.  This is a 62 year old female with history of COPD and asthma, hypertension, anemia, and hyperlipidemia presenting to the emergency department for shortness of breath.  Patient states starting yesterday she started experiencing worsening shortness of breath and wheezing.  She had been taking her at home medications for asthma/COPD which were helping but her symptoms were returning.  Today she continued to have worsening symptoms to the point where she felt like she could not breathe.  She called EMS who gave her Solu-Medrol, magnesium, and albuterol and she was transported to the emergency department.  Patient denies recent fevers, no chest pain, no abdominal pain, no nausea or vomiting.  No recent illnesses or sick contacts.        Home Medications Prior to Admission medications   Medication Sig Start Date End Date Taking? Authorizing Provider  albuterol (VENTOLIN HFA) 108 (90 Base) MCG/ACT inhaler Inhale 2 puffs into the lungs every 6 (six) hours as needed for wheezing or shortness of breath. 10/18/22   Valentina Shaggy, MD  atorvastatin (LIPITOR) 40 MG tablet Take 1 tablet (40 mg total) by mouth daily. TAKE 1 TABLET(40 MG) BY MOUTH DAILY Strength: 40 mg Patient taking differently: Take 40 mg by mouth daily. 05/23/22   Biagio Borg, MD  azelastine (OPTIVAR) 0.05 % ophthalmic solution Place 1 drop into both eyes 2 (two) times daily. 05/18/22   Biagio Borg, MD  BELSOMRA 15 MG TABS Take 15 mg by mouth at bedtime as needed (sleep). 05/24/22   [provider]  BREZTRI AEROSPHERE 160-9-4.8 MCG/ACT AERO Inhale 2 puffs into the lungs in the morning and at bedtime. 11/27/22   [provider]  butalbital-acetaminophen-caffeine  (FIORICET) 50-325-40 MG tablet 1 tab by mouth once daily as needed for HA, limit 14 per month 09/17/22   Biagio Borg, MD  carbamazepine (TEGRETOL) 200 MG tablet Take 2-3 tablets (400-600 mg total) by mouth See admin instructions. Take '400mg'$  in the morning and '600mg'$  at bedtime. 02/06/23   Plovsky, Berneta Sages, MD  cetirizine (ZYRTEC) 10 MG tablet Can take one tablet by mouth once daily if needed for runny nose. Patient taking differently: Take 10 mg by mouth daily as needed for rhinitis. 04/28/21   Dara Hoyer, FNP  Cholecalciferol (VITAMIN D) 125 MCG (5000 UT) CAPS Take 5,000 Units by mouth daily.    [provider]  cyclobenzaprine (FLEXERIL) 5 MG tablet Take 5 mg by mouth 3 (three) times daily as needed for muscle spasms. 10/25/22   [provider]  diclofenac Sodium (VOLTAREN) 1 % GEL Apply 4 g topically 4 (four) times daily. Patient taking differently: Apply 4 g topically 4 (four) times daily as needed (pain). 06/04/20   Deno Etienne, DO  famotidine (PEPCID) 40 MG tablet Take 1 tablet (40 mg total) by mouth at bedtime. 01/22/23   Martyn Ehrich, NP  ferrous sulfate 325 (65 FE) MG EC tablet Take 1 tablet (325 mg total) by mouth daily with breakfast. 09/14/22   Biagio Borg, MD  fluticasone [facility]) 50 MCG/ACT nasal spray Place 1 spray into both nostrils 2 (two) times daily as needed for allergies or rhinitis. 10/19/22   Valentina Shaggy, MD  gabapentin (NEURONTIN) 100  MG capsule Take 1 capsule (100 mg total) by mouth 3 (three) times daily. 12/21/22   Plovsky, Berneta Sages, MD  guaiFENesin-dextromethorphan (ROBITUSSIN DM) 100-10 MG/5ML syrup Take 10 mLs by mouth every 4 (four) hours as needed for cough. Patient not taking: Reported on 01/22/2023 01/15/23   Hosie Poisson, MD  hydrochlorothiazide (MICROZIDE) 12.5 MG capsule Take 1 capsule (12.5 mg total) by mouth daily. TAKE 1 CAPSULE(12.5 MG) BY MOUTH DAILY Strength: 12.5 mg Patient taking differently: Take 12.5 mg by mouth daily. 05/23/22   Biagio Borg, MD  hydrOXYzine (VISTARIL) 25 MG capsule Take 1 capsule (25 mg total) by mouth every 6 (six) hours as needed for anxiety. 02/06/23   Plovsky, Berneta Sages, MD  ipratropium-albuterol (DUONEB) 0.5-2.5 (3) MG/3ML SOLN Take 3 mLs by nebulization 2 (two) times daily. 01/15/23   Hosie Poisson, MD  irbesartan (AVAPRO) 75 MG tablet TAKE 1 TABLET(75 MG) BY MOUTH DAILY Patient taking differently: Take 75 mg by mouth daily. 11/06/21   Biagio Borg, MD  montelukast (SINGULAIR) 10 MG tablet Take 1 tablet (10 mg total) by mouth at bedtime. 10/18/22 01/16/23  Valentina Shaggy, MD  ondansetron (ZOFRAN ODT) 4 MG disintegrating tablet Take 1 tablet (4 mg total) by mouth every 8 (eight) hours as needed for nausea or vomiting. 05/19/21   Valentina Shaggy, MD  pantoprazole (PROTONIX) 40 MG tablet TAKE 1 TABLET BY MOUTH EVERY DAY Patient taking differently: Take 40 mg by mouth daily. 09/17/22   Biagio Borg, MD  pantoprazole (PROTONIX) 40 MG tablet Take 1 tablet (40 mg total) by mouth 2 (two) times daily. 01/22/23   Martyn Ehrich, NP  potassium chloride (KLOR-CON M) 10 MEQ tablet Take 1 tablet (10 mEq total) by mouth daily. 05/23/22   Biagio Borg, MD  revefenacin (YUPELRI) 175 MCG/3ML nebulizer solution Take 3 mLs (175 mcg total) by nebulization daily. 10/18/22   Valentina Shaggy, MD  Skin Protectants, Misc. (EUCERIN) cream Apply 1 Application topically daily as needed for dry skin.    [provider]  tacrolimus (PROTOPIC) 0.03 % ointment Apply topically 2 (two) times daily. Patient taking differently: Apply 1 Application topically 2 (two) times daily as needed (rash). 08/02/22   Valentina Shaggy, MD  triamcinolone ointment (KENALOG) 0.1 % Apply 1 Application topically 2 (two) times daily. 10/25/22   Dara Hoyer, FNP      Allergies    Sulfa antibiotics and Venofer [iron sucrose]    Review of Systems   Review of Systems  Constitutional:  Negative for chills and fever.  Respiratory:   Positive for shortness of breath. Negative for cough.   Cardiovascular:  Negative for chest pain.  Gastrointestinal:  Negative for abdominal pain.  Genitourinary:  Negative for dysuria.  Neurological:  Negative for dizziness and syncope.    Physical Exam Updated Vital Signs BP (!) 164/96   Pulse 88   Temp 98.3 F (36.8 C) (Oral)   Resp (!) 23   Ht '5\' 7"'$  (1.702 m)   Wt 87.5 kg   SpO2 100%   BMI 30.23 kg/m  Physical Exam Vitals and nursing note reviewed.  Constitutional:      Appearance: She is well-developed. She is ill-appearing.  HENT:     Head: Normocephalic.     Mouth/Throat:     Mouth: Mucous membranes are moist.  Eyes:     Pupils: Pupils are equal, round, and reactive to light.  Cardiovascular:     Rate and Rhythm: Normal rate  and regular rhythm.     Heart sounds: No murmur heard.    No friction rub. No gallop.  Pulmonary:     Effort: Tachypnea present.     Breath sounds: Decreased breath sounds (in all lung fields) and wheezing (Diffuse expiratory wheezing) present.  Chest:     Chest wall: No tenderness.  Musculoskeletal:     Right lower leg: No edema.     Left lower leg: No edema.  Skin:    General: Skin is warm.     Capillary Refill: Capillary refill takes less than 2 seconds.  Neurological:     General: No focal deficit present.     Mental Status: She is alert and oriented to person, place, and time.     ED Results / Procedures / Treatments   Labs (all labs ordered are listed, but only abnormal results are displayed) Labs Reviewed - No data to display  EKG None  Radiology No results found.  Procedures Procedures    Medications Ordered in ED Medications - No data to display  ED Course/ Medical Decision Making/ A&P                             Medical Decision Making Patient presents acutely short of breath, history of COPD and asthma.  With her wheezing, symptoms improving with bronchodilators I have a high degree suspicion for acute  asthma/COPD exacerbation.  She is afebrile, she has no focal findings on exam to suggest pneumonia.  We will obtain a screening chest x-ray for further evaluation.  She has no history of heart failure, no peripheral edema, no JVD, no crackles at lung bases, low suspicion for acute heart failure exacerbation at this time.  She is denying any chest pain no history of ACS, no nausea or vomiting, no epigastric pain, no other chest pain equivalents to suggest ACS at this time.  Will give patient 2 more DuoNebs, and as she has already received steroids and magnesium, plan to put patient on BiPAP given she has poor air movement bilaterally in her lung fields.  CBC, CMP, mag, venous blood gas sent.  Chest x-ray and EKG ordered.  I personally reviewed and interpreted patient's labs.  VBG shows pH of 7.41 with a CO2 of 44, CMP shows a normal bicarb of 27, no electrolyte abnormalities, no anion gap acidosis with a normal creatinine.  CBC shows a leukocytosis of 12.1, this is likely demargination secondary to steroids versus stress response in the setting of no infectious symptoms and patient is afebrile.  I personally reviewed and interpreted patient's chest x-ray and EKG, I agree with radiology there is no focal findings on chest x-ray such as consolidations, effusions or pneumothorax.  EKG shows sinus rhythm with rate 85, PR, QRS, QTc normal, no acute ischemic changes.  Upon reevaluation patient appears much more comfortable on BiPAP.  Her COPD/asthma exacerbation is of unknown etiology at this time however it is likely environmental.  Will continue to keep patient on BiPAP and admit to hospital for further monitoring and management.  I called and discussed this case with the hospitalist team who agreed to admit patient to their service for further management and workup.  Patient was stable upon admission to hospital.  Problems Addressed: Acute respiratory failure, unspecified whether with hypoxia or hypercapnia  ([place]): acute illness or injury that poses a threat to life or bodily functions COPD exacerbation ([place]): acute illness or injury that poses  a threat to life or bodily functions  Amount and/or Complexity of Data Reviewed External Data Reviewed: notes.    Details: Per chart review patient discharged on 01/15/2023 after admission for acute asthma exacerbation/COPD exacerbation in the setting of influenza A Labs: ordered. Decision-making details documented in ED Course. Radiology: ordered and independent interpretation performed. Decision-making details documented in ED Course. ECG/medicine tests: ordered and independent interpretation performed. Decision-making details documented in ED Course.  Risk Prescription drug management. Decision regarding hospitalization.          Final Clinical Impression(s) / ED Diagnoses Final diagnoses:  None    Rx / DC Orders ED Discharge Orders     None         Jimmie Molly, MD 02/21/23 2308    Lennice Sites, DO 02/22/23 2016

## 2023-02-22 ENCOUNTER — Telehealth: Payer: Self-pay | Admitting: Emergency Medicine

## 2023-02-22 ENCOUNTER — Inpatient Hospital Stay (HOSPITAL_COMMUNITY): Payer: Medicare HMO

## 2023-02-22 DIAGNOSIS — K7460 Unspecified cirrhosis of liver: Secondary | ICD-10-CM | POA: Diagnosis present

## 2023-02-22 DIAGNOSIS — Z800 Family history of malignant neoplasm of digestive organs: Secondary | ICD-10-CM | POA: Diagnosis not present

## 2023-02-22 DIAGNOSIS — I739 Peripheral vascular disease, unspecified: Secondary | ICD-10-CM | POA: Diagnosis present

## 2023-02-22 DIAGNOSIS — J9611 Chronic respiratory failure with hypoxia: Secondary | ICD-10-CM | POA: Diagnosis not present

## 2023-02-22 DIAGNOSIS — E669 Obesity, unspecified: Secondary | ICD-10-CM | POA: Diagnosis present

## 2023-02-22 DIAGNOSIS — I2510 Atherosclerotic heart disease of native coronary artery without angina pectoris: Secondary | ICD-10-CM | POA: Diagnosis present

## 2023-02-22 DIAGNOSIS — J432 Centrilobular emphysema: Secondary | ICD-10-CM | POA: Diagnosis not present

## 2023-02-22 DIAGNOSIS — J45901 Unspecified asthma with (acute) exacerbation: Secondary | ICD-10-CM

## 2023-02-22 DIAGNOSIS — M797 Fibromyalgia: Secondary | ICD-10-CM | POA: Diagnosis present

## 2023-02-22 DIAGNOSIS — Z8673 Personal history of transient ischemic attack (TIA), and cerebral infarction without residual deficits: Secondary | ICD-10-CM | POA: Diagnosis not present

## 2023-02-22 DIAGNOSIS — G40909 Epilepsy, unspecified, not intractable, without status epilepticus: Secondary | ICD-10-CM | POA: Diagnosis present

## 2023-02-22 DIAGNOSIS — E785 Hyperlipidemia, unspecified: Secondary | ICD-10-CM | POA: Diagnosis present

## 2023-02-22 DIAGNOSIS — J441 Chronic obstructive pulmonary disease with (acute) exacerbation: Secondary | ICD-10-CM

## 2023-02-22 DIAGNOSIS — I10 Essential (primary) hypertension: Secondary | ICD-10-CM | POA: Diagnosis present

## 2023-02-22 DIAGNOSIS — M351 Other overlap syndromes: Secondary | ICD-10-CM | POA: Diagnosis present

## 2023-02-22 DIAGNOSIS — Z803 Family history of malignant neoplasm of breast: Secondary | ICD-10-CM | POA: Diagnosis not present

## 2023-02-22 DIAGNOSIS — F319 Bipolar disorder, unspecified: Secondary | ICD-10-CM | POA: Diagnosis present

## 2023-02-22 DIAGNOSIS — J9612 Chronic respiratory failure with hypercapnia: Secondary | ICD-10-CM

## 2023-02-22 DIAGNOSIS — R0603 Acute respiratory distress: Secondary | ICD-10-CM | POA: Diagnosis not present

## 2023-02-22 DIAGNOSIS — Z811 Family history of alcohol abuse and dependence: Secondary | ICD-10-CM | POA: Diagnosis not present

## 2023-02-22 DIAGNOSIS — Z8249 Family history of ischemic heart disease and other diseases of the circulatory system: Secondary | ICD-10-CM | POA: Diagnosis not present

## 2023-02-22 DIAGNOSIS — Z808 Family history of malignant neoplasm of other organs or systems: Secondary | ICD-10-CM | POA: Diagnosis not present

## 2023-02-22 DIAGNOSIS — J9809 Other diseases of bronchus, not elsewhere classified: Secondary | ICD-10-CM | POA: Diagnosis not present

## 2023-02-22 DIAGNOSIS — Z6834 Body mass index (BMI) 34.0-34.9, adult: Secondary | ICD-10-CM | POA: Diagnosis not present

## 2023-02-22 DIAGNOSIS — Z87891 Personal history of nicotine dependence: Secondary | ICD-10-CM | POA: Diagnosis not present

## 2023-02-22 DIAGNOSIS — Z1152 Encounter for screening for COVID-19: Secondary | ICD-10-CM | POA: Diagnosis not present

## 2023-02-22 DIAGNOSIS — I5031 Acute diastolic (congestive) heart failure: Secondary | ICD-10-CM | POA: Diagnosis not present

## 2023-02-22 DIAGNOSIS — J9621 Acute and chronic respiratory failure with hypoxia: Secondary | ICD-10-CM | POA: Diagnosis present

## 2023-02-22 DIAGNOSIS — J439 Emphysema, unspecified: Secondary | ICD-10-CM | POA: Diagnosis present

## 2023-02-22 DIAGNOSIS — D509 Iron deficiency anemia, unspecified: Secondary | ICD-10-CM | POA: Diagnosis present

## 2023-02-22 DIAGNOSIS — K219 Gastro-esophageal reflux disease without esophagitis: Secondary | ICD-10-CM | POA: Diagnosis present

## 2023-02-22 LAB — RESP PANEL BY RT-PCR (RSV, FLU A&B, COVID)  RVPGX2
Influenza A by PCR: NEGATIVE
Influenza B by PCR: NEGATIVE
Resp Syncytial Virus by PCR: NEGATIVE
SARS Coronavirus 2 by RT PCR: NEGATIVE

## 2023-02-22 LAB — CBC WITH DIFFERENTIAL/PLATELET
Abs Immature Granulocytes: 0.06 10*3/uL (ref 0.00–0.07)
Abs Immature Granulocytes: 0.02 10*3/uL (ref 0.00–0.07)
Basophils Absolute: 0 10*3/uL (ref 0.0–0.1)
Basophils Absolute: 0 10*3/uL (ref 0.0–0.1)
Basophils Relative: 0 %
Basophils Relative: 1 %
Eosinophils Absolute: 0 10*3/uL (ref 0.0–0.5)
Eosinophils Absolute: 0.2 10*3/uL (ref 0.0–0.5)
Eosinophils Relative: 0 %
Eosinophils Relative: 2 %
HCT: 31.2 % — ABNORMAL LOW (ref 36.0–46.0)
HCT: 38.6 % (ref 36.0–46.0)
Hemoglobin: 10.2 g/dL — ABNORMAL LOW (ref 12.0–15.0)
Hemoglobin: 12.6 g/dL (ref 12.0–15.0)
Immature Granulocytes: 1 %
Immature Granulocytes: 0 %
Lymphocytes Relative: 6 %
Lymphocytes Relative: 56 %
Lymphs Abs: 0.6 10*3/uL — ABNORMAL LOW (ref 0.7–4.0)
Lymphs Abs: 3.4 10*3/uL (ref 0.7–4.0)
MCH: 31.3 pg (ref 26.0–34.0)
MCH: 30.5 pg (ref 26.0–34.0)
MCHC: 32.7 g/dL (ref 30.0–36.0)
MCHC: 32.6 g/dL (ref 30.0–36.0)
MCV: 95.7 fL (ref 80.0–100.0)
MCV: 93.5 fL (ref 80.0–100.0)
Monocytes Absolute: 0.1 10*3/uL (ref 0.1–1.0)
Monocytes Absolute: 0.4 10*3/uL (ref 0.1–1.0)
Monocytes Relative: 1 %
Monocytes Relative: 7 %
Neutro Abs: 9.9 10*3/uL — ABNORMAL HIGH (ref 1.7–7.7)
Neutro Abs: 2.1 10*3/uL (ref 1.7–7.7)
Neutrophils Relative %: 92 %
Neutrophils Relative %: 34 %
Platelets: 310 10*3/uL (ref 150–400)
Platelets: 327 10*3/uL (ref 150–400)
RBC: 3.26 MIL/uL — ABNORMAL LOW (ref 3.87–5.11)
RBC: 4.13 MIL/uL (ref 3.87–5.11)
RDW: 15.8 % — ABNORMAL HIGH (ref 11.5–15.5)
RDW: 13.6 % (ref 11.5–15.5)
WBC: 10.7 10*3/uL — ABNORMAL HIGH (ref 4.0–10.5)
WBC: 6.2 10*3/uL (ref 4.0–10.5)
nRBC: 0 % (ref 0.0–0.2)
nRBC: 0.3 % — ABNORMAL HIGH (ref 0.0–0.2)

## 2023-02-22 LAB — MAGNESIUM: Magnesium: 2.3 mg/dL (ref 1.7–2.4)

## 2023-02-22 LAB — CARBAMAZEPINE LEVEL, TOTAL: Carbamazepine Lvl: 2.1 ug/mL — ABNORMAL LOW (ref 4.0–12.0)

## 2023-02-22 LAB — BASIC METABOLIC PANEL
Anion gap: 9 (ref 5–15)
Anion gap: 10 (ref 5–15)
BUN: 8 mg/dL (ref 6–20)
BUN: 14 mg/dL (ref 6–20)
CO2: 25 mmol/L (ref 22–32)
CO2: 22 mmol/L (ref 22–32)
Calcium: 8.7 mg/dL — ABNORMAL LOW (ref 8.9–10.3)
Calcium: 9.3 mg/dL (ref 8.9–10.3)
Chloride: 107 mmol/L (ref 98–111)
Chloride: 107 mmol/L (ref 98–111)
Creatinine, Ser: 0.64 mg/dL (ref 0.44–1.00)
Creatinine, Ser: 0.86 mg/dL (ref 0.44–1.00)
GFR, Estimated: >60 mL/min (ref >60)
GFR, Estimated: >60 mL/min (ref >60)
Glucose, Bld: 166 mg/dL — ABNORMAL HIGH (ref 70–99)
Glucose, Bld: 181 mg/dL — ABNORMAL HIGH (ref 70–99)
Potassium: 4 mmol/L (ref 3.5–5.1)
Potassium: 3.9 mmol/L (ref 3.5–5.1)
Sodium: 141 mmol/L (ref 135–145)
Sodium: 139 mmol/L (ref 135–145)

## 2023-02-22 LAB — GLUCOSE, CAPILLARY
Glucose-Capillary: 127 mg/dL — ABNORMAL HIGH (ref 70–99)
Glucose-Capillary: 200 mg/dL — ABNORMAL HIGH (ref 70–99)

## 2023-02-22 LAB — CBG MONITORING, ED
Glucose-Capillary: 132 mg/dL — ABNORMAL HIGH (ref 70–99)
Glucose-Capillary: 91 mg/dL (ref 70–99)

## 2023-02-22 LAB — MRSA NEXT GEN BY PCR, NASAL: MRSA by PCR Next Gen: NOT DETECTED

## 2023-02-22 LAB — D-DIMER, QUANTITATIVE: D-Dimer, Quant: <0.27 ug/mL-FEU (ref 0.00–0.50)

## 2023-02-22 MED ORDER — DOXYCYCLINE HYCLATE 100 MG PO TABS
100.0000 mg | ORAL_TABLET | Freq: Two times a day (BID) | ORAL | Status: DC
  Administered 2023-02-22 – 2023-02-27 (×10): 100 mg via ORAL
  Filled 2023-02-22 (×10): qty 1

## 2023-02-22 MED ORDER — ENOXAPARIN SODIUM 40 MG/0.4ML IJ SOSY: 40.0000 mg | PREFILLED_SYRINGE | Freq: Every day | INTRAMUSCULAR | Status: DC

## 2023-02-22 MED ORDER — ACETAMINOPHEN 650 MG RE SUPP: 650.0000 mg | Freq: Four times a day (QID) | RECTAL | Status: DC | PRN

## 2023-02-22 MED ORDER — CARBAMAZEPINE 200 MG PO TABS
600.0000 mg | ORAL_TABLET | Freq: Every day | ORAL | Status: DC
  Administered 2023-02-22 – 2023-02-26 (×5): 600 mg via ORAL
  Filled 2023-02-22 (×8): qty 3

## 2023-02-22 MED ORDER — FAMOTIDINE 20 MG PO TABS
40.0000 mg | ORAL_TABLET | Freq: Every day | ORAL | Status: DC
  Administered 2023-02-22 – 2023-02-26 (×5): 40 mg via ORAL
  Filled 2023-02-22 (×5): qty 2

## 2023-02-22 MED ORDER — PREDNISONE 20 MG PO TABS
40.0000 mg | ORAL_TABLET | Freq: Every day | ORAL | Status: DC
  Administered 2023-02-22 – 2023-02-23 (×2): 40 mg via ORAL
  Filled 2023-02-22 (×2): qty 2

## 2023-02-22 MED ORDER — ENOXAPARIN SODIUM 40 MG/0.4ML IJ SOSY
40.0000 mg | PREFILLED_SYRINGE | INTRAMUSCULAR | Status: DC
  Administered 2023-02-22 – 2023-02-27 (×6): 40 mg via SUBCUTANEOUS
  Filled 2023-02-22 (×6): qty 0.4

## 2023-02-22 MED ORDER — DOXYCYCLINE HYCLATE 100 MG PO TABS
200.0000 mg | ORAL_TABLET | Freq: Once | ORAL | Status: AC
  Administered 2023-02-22: 200 mg via ORAL
  Filled 2023-02-22: qty 2

## 2023-02-22 MED ORDER — VITAMIN D 25 MCG (1000 UNIT) PO TABS
5000.0000 [IU] | ORAL_TABLET | Freq: Every day | ORAL | Status: DC
  Administered 2023-02-22 – 2023-02-27 (×6): 5000 [IU] via ORAL
  Filled 2023-02-22 (×6): qty 5

## 2023-02-22 MED ORDER — ONDANSETRON HCL 4 MG PO TABS: 4.0000 mg | ORAL_TABLET | Freq: Four times a day (QID) | ORAL | Status: DC | PRN

## 2023-02-22 MED ORDER — FLUTICASONE PROPIONATE 50 MCG/ACT NA SUSP
1.0000 | Freq: Two times a day (BID) | NASAL | Status: DC | PRN
  Administered 2023-02-23 – 2023-02-24 (×2): 1 via NASAL
  Filled 2023-02-22: qty 16

## 2023-02-22 MED ORDER — FLUTICASONE FUROATE-VILANTEROL 100-25 MCG/ACT IN AEPB
1.0000 | INHALATION_SPRAY | Freq: Every day | RESPIRATORY_TRACT | Status: DC
  Administered 2023-02-23: 1 via RESPIRATORY_TRACT
  Filled 2023-02-22: qty 28

## 2023-02-22 MED ORDER — KETOTIFEN FUMARATE 0.035 % OP SOLN
1.0000 [drp] | Freq: Two times a day (BID) | OPHTHALMIC | Status: DC
  Administered 2023-02-22 – 2023-02-27 (×10): 1 [drp] via OPHTHALMIC
  Filled 2023-02-22 (×2): qty 5

## 2023-02-22 MED ORDER — ACETAMINOPHEN 325 MG PO TABS
650.0000 mg | ORAL_TABLET | Freq: Four times a day (QID) | ORAL | Status: DC | PRN
  Administered 2023-02-22: 650 mg via ORAL
  Filled 2023-02-22: qty 2

## 2023-02-22 MED ORDER — UMECLIDINIUM BROMIDE 62.5 MCG/ACT IN AEPB
1.0000 | INHALATION_SPRAY | Freq: Every day | RESPIRATORY_TRACT | Status: DC
  Administered 2023-02-23: 1 via RESPIRATORY_TRACT
  Filled 2023-02-22: qty 7

## 2023-02-22 MED ORDER — OXYCODONE HCL 5 MG PO TABS
5.0000 mg | ORAL_TABLET | Freq: Once | ORAL | Status: AC
  Administered 2023-02-22: 5 mg via ORAL
  Filled 2023-02-22: qty 1

## 2023-02-22 MED ORDER — OXYCODONE HCL 5 MG PO TABS
5.0000 mg | ORAL_TABLET | ORAL | Status: AC | PRN
  Administered 2023-02-22 – 2023-02-25 (×2): 5 mg via ORAL
  Filled 2023-02-22 (×2): qty 1

## 2023-02-22 MED ORDER — SUVOREXANT 15 MG PO TABS: 15.0000 mg | ORAL_TABLET | Freq: Every evening | ORAL | Status: DC | PRN

## 2023-02-22 MED ORDER — IPRATROPIUM-ALBUTEROL 0.5-2.5 (3) MG/3ML IN SOLN: 3.0000 mL | RESPIRATORY_TRACT | Status: DC | PRN

## 2023-02-22 MED ORDER — OXYCODONE HCL 5 MG PO TABS: 5.0000 mg | ORAL_TABLET | Freq: Four times a day (QID) | ORAL | Status: DC | PRN

## 2023-02-22 MED ORDER — ALBUTEROL SULFATE (2.5 MG/3ML) 0.083% IN NEBU
2.5000 mg | INHALATION_SOLUTION | Freq: Four times a day (QID) | RESPIRATORY_TRACT | Status: DC | PRN
  Administered 2023-02-22 – 2023-02-26 (×4): 2.5 mg via RESPIRATORY_TRACT
  Filled 2023-02-22 (×4): qty 3

## 2023-02-22 MED ORDER — DM-GUAIFENESIN ER 30-600 MG PO TB12
1.0000 | ORAL_TABLET | Freq: Two times a day (BID) | ORAL | Status: DC
  Administered 2023-02-22 – 2023-02-27 (×11): 1 via ORAL
  Filled 2023-02-22 (×14): qty 1

## 2023-02-22 MED ORDER — CARBAMAZEPINE 200 MG PO TABS: 400.0000 mg | ORAL_TABLET | ORAL | Status: DC

## 2023-02-22 MED ORDER — MONTELUKAST SODIUM 10 MG PO TABS
10.0000 mg | ORAL_TABLET | Freq: Every day | ORAL | Status: DC
  Administered 2023-02-22 – 2023-02-26 (×6): 10 mg via ORAL
  Filled 2023-02-22 (×6): qty 1

## 2023-02-22 MED ORDER — IRBESARTAN 75 MG PO TABS
75.0000 mg | ORAL_TABLET | Freq: Every day | ORAL | Status: DC
  Administered 2023-02-22 – 2023-02-27 (×6): 75 mg via ORAL
  Filled 2023-02-22 (×7): qty 1

## 2023-02-22 MED ORDER — BUTALBITAL-APAP-CAFFEINE 50-325-40 MG PO TABS
1.0000 | ORAL_TABLET | Freq: Four times a day (QID) | ORAL | Status: DC | PRN
  Administered 2023-02-22 – 2023-02-25 (×6): 1 via ORAL
  Filled 2023-02-22 (×6): qty 1

## 2023-02-22 MED ORDER — SENNOSIDES-DOCUSATE SODIUM 8.6-50 MG PO TABS: 1.0000 | ORAL_TABLET | Freq: Every evening | ORAL | Status: DC | PRN

## 2023-02-22 MED ORDER — GABAPENTIN 100 MG PO CAPS
100.0000 mg | ORAL_CAPSULE | Freq: Three times a day (TID) | ORAL | Status: DC
  Administered 2023-02-22 – 2023-02-27 (×16): 100 mg via ORAL
  Filled 2023-02-22 (×16): qty 1

## 2023-02-22 MED ORDER — BUDESON-GLYCOPYRROL-FORMOTEROL 160-9-4.8 MCG/ACT IN AERO: 2.0000 | INHALATION_SPRAY | Freq: Two times a day (BID) | RESPIRATORY_TRACT | Status: DC

## 2023-02-22 MED ORDER — HYDROXYZINE PAMOATE 25 MG PO CAPS: 25.0000 mg | ORAL_CAPSULE | Freq: Four times a day (QID) | ORAL | Status: DC | PRN

## 2023-02-22 MED ORDER — DUPILUMAB 300 MG/2ML ~~LOC~~ SOSY: 300.0000 mg | PREFILLED_SYRINGE | SUBCUTANEOUS | Status: DC

## 2023-02-22 MED ORDER — ALBUTEROL SULFATE (2.5 MG/3ML) 0.083% IN NEBU
2.5000 mg | INHALATION_SOLUTION | Freq: Four times a day (QID) | RESPIRATORY_TRACT | Status: DC
  Administered 2023-02-22 – 2023-02-23 (×6): 2.5 mg via RESPIRATORY_TRACT
  Filled 2023-02-22 (×7): qty 3

## 2023-02-22 MED ORDER — HYDROCHLOROTHIAZIDE 12.5 MG PO TABS
12.5000 mg | ORAL_TABLET | Freq: Every day | ORAL | Status: DC
  Administered 2023-02-22 – 2023-02-27 (×6): 12.5 mg via ORAL
  Filled 2023-02-22 (×7): qty 1

## 2023-02-22 MED ORDER — FERROUS SULFATE 325 (65 FE) MG PO TABS
325.0000 mg | ORAL_TABLET | Freq: Every day | ORAL | Status: DC
  Administered 2023-02-23 – 2023-02-27 (×5): 325 mg via ORAL
  Filled 2023-02-22 (×6): qty 1

## 2023-02-22 MED ORDER — REVEFENACIN 175 MCG/3ML IN SOLN
175.0000 ug | Freq: Every day | RESPIRATORY_TRACT | Status: DC
  Administered 2023-02-23: 175 ug via RESPIRATORY_TRACT
  Filled 2023-02-22 (×2): qty 3

## 2023-02-22 MED ORDER — ATORVASTATIN CALCIUM 40 MG PO TABS
40.0000 mg | ORAL_TABLET | Freq: Every day | ORAL | Status: DC
  Administered 2023-02-22 – 2023-02-26 (×5): 40 mg via ORAL
  Filled 2023-02-22 (×5): qty 1

## 2023-02-22 MED ORDER — ONDANSETRON HCL 4 MG/2ML IJ SOLN
4.0000 mg | Freq: Four times a day (QID) | INTRAMUSCULAR | Status: DC | PRN
  Administered 2023-02-22: 4 mg via INTRAVENOUS
  Filled 2023-02-22: qty 2

## 2023-02-22 MED ORDER — CARBAMAZEPINE 200 MG PO TABS
400.0000 mg | ORAL_TABLET | Freq: Every day | ORAL | Status: DC
  Administered 2023-02-22 – 2023-02-27 (×6): 400 mg via ORAL
  Filled 2023-02-22 (×7): qty 2

## 2023-02-22 MED ORDER — PANTOPRAZOLE SODIUM 40 MG PO TBEC
40.0000 mg | DELAYED_RELEASE_TABLET | Freq: Two times a day (BID) | ORAL | Status: DC
  Administered 2023-02-22 – 2023-02-23 (×4): 40 mg via ORAL
  Filled 2023-02-22 (×4): qty 1

## 2023-02-22 NOTE — ED Notes (Signed)
ED TO INPATIENT HANDOFF REPORT  ED Nurse Name and Phone #: [phone]  S Name/Age/Gender [name] 62 y.o. female Room/Bed: OTFC/OTF  Code Status   Code Status: Full Code  Home/SNF/Other Home Patient oriented to: self, place, time, and situation Is this baseline? Yes   Triage Complete: Triage complete  Chief Complaint Acute exacerbation of COPD with asthma ([place]) [J44.1, J45.901]  Triage Note Pt bib gcems for shortness of breath and weakness for the past two days. Hx of copd and admitted for the same recently. '10mg'$  albuterol, .'5mg'$  atrovent, 2g mag, '125mg'$  solumedrol given PTA. VSS per ems.    Allergies Allergies  Allergen Reactions   Sulfa Antibiotics Nausea And Vomiting and Other (See Comments)   Venofer [Iron Sucrose] Hives    Level of Care/Admitting Diagnosis ED Disposition     ED Disposition  Admit   Condition  --   Comment  Hospital Area: [place] [[id number]]  Level of Care: Telemetry Cardiac [[id number]]  May admit patient to [facility] or [facility] if equivalent level of care is available:: Yes  Covid Evaluation: Confirmed COVID Negative  Diagnosis: Acute exacerbation of COPD with asthma [facility]) [id number]  Admitting Physician: Quintella Baton [[id number]]  Attending Physician: Quintella Baton [id number]  Certification:: I certify this patient will need inpatient services for at least 2 midnights  Estimated Length of Stay: 2          B Medical/Surgery History Past Medical History:  Diagnosis Date   Anemia, iron deficiency 12/22/2014   pt. 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      Bipolar disorder ([place])    with anxiety, depression   COPD (chronic obstructive pulmonary disease) ([place])    Eczema 05/18/2022   Fibromyalgia 05/14/2014   GERD  (gastroesophageal reflux disease)    History of kidney stones    Hyperlipidemia 04/20/2017   HYPERTENSION 05/12/2009   Qualifier: Diagnosis of  By: Lenn Cal Deborra Medina), [name]     Peripheral vascular disease ([place])    Pneumonia    Prediabetes 02/23/2014   pt. denies   Seizure ([place])    Stroke ([place]) 11/2020   Urticaria    Past Surgical History:  Procedure Laterality Date   ABDOMINAL HYSTERECTOMY     ANTERIOR CERVICAL DECOMP/DISCECTOMY FUSION N/A 07/28/2020   Procedure: ANTERIOR CERVICAL DECOMPRESSION/DISCECTOMY FUSION. INTERBODY PROTHESIS, PLATE/SCREWS CERVICAL THREE- CERVICAL FOUR, CERVICAL FOUR- CERVICAL FIVE;  Surgeon: Newman Pies, MD;  Location: [place];  Service: Neurosurgery;  Laterality: N/A;   BACK SURGERY     COLONOSCOPY  12/20/2011   Procedure: COLONOSCOPY;  Surgeon: Landry Dyke, MD;  Location: [facility];  Service: Endoscopy;  Laterality: N/A;   COLONOSCOPY  03/05/2012   Procedure: COLONOSCOPY;  Surgeon: Landry Dyke, MD;  Location: [facility];  Service: Endoscopy;  Laterality: N/A;   DIAGNOSTIC LAPAROSCOPY     HEMORRHOID SURGERY     INCISE AND DRAIN ABCESS     KIDNEY STONE SURGERY     NECK SURGERY     x 2 Dr Orinda Kenner   SPINE SURGERY  2019   TOE SURGERY     TUBAL LIGATION       A IV Location/Drains/Wounds Patient Lines/Drains/Airways Status     Active Line/Drains/Airways     Name Placement date Placement time Site Days  Peripheral IV 02/21/23 20 G Anterior;Left Forearm 02/21/23  2200  Forearm  1            Intake/Output Last 24 hours  Intake/Output Summary (Last 24 hours) at 02/22/2023 1434 Last data filed at 02/22/2023 0017 Gross per 24 hour  Intake 251.87 ml  Output --  Net 251.87 ml    Labs/Imaging Results for orders placed or performed during the hospital encounter of 02/21/23 (from the past 48 hour(s))  CBC with Differential     Status: Abnormal   Collection Time: 02/21/23  9:00 PM  Result Value Ref Range   WBC 12.1 (H) 4.0 - 10.5  K/uL   RBC 3.26 (L) 3.87 - 5.11 MIL/uL   Hemoglobin 10.3 (L) 12.0 - 15.0 g/dL   HCT 31.6 (L) 36.0 - 46.0 %   MCV 96.9 80.0 - 100.0 fL   MCH 31.6 26.0 - 34.0 pg   MCHC 32.6 30.0 - 36.0 g/dL   RDW 15.7 (H) 11.5 - 15.5 %   Platelets 329 150 - 400 K/uL   nRBC 0.0 0.0 - 0.2 %   Neutrophils Relative % 62 %   Neutro Abs 7.5 1.7 - 7.7 K/uL   Lymphocytes Relative 27 %   Lymphs Abs 3.2 0.7 - 4.0 K/uL   Monocytes Relative 9 %   Monocytes Absolute 1.1 (H) 0.1 - 1.0 K/uL   Eosinophils Relative 1 %   Eosinophils Absolute 0.1 0.0 - 0.5 K/uL   Basophils Relative 0 %   Basophils Absolute 0.0 0.0 - 0.1 K/uL   Immature Granulocytes 1 %   Abs Immature Granulocytes 0.07 0.00 - 0.07 K/uL    Comment: Performed at [facility], [address], [place], [place] [address]  Comprehensive metabolic panel     Status: Abnormal   Collection Time: 02/21/23  9:00 PM  Result Value Ref Range   Sodium 141 135 - 145 mmol/L   Potassium 3.6 3.5 - 5.1 mmol/L   Chloride 105 98 - 111 mmol/L   CO2 27 22 - 32 mmol/L   Glucose, Bld 171 (H) 70 - 99 mg/dL    Comment: Glucose reference range applies only to samples taken after fasting for at least 8 hours.   BUN 7 6 - 20 mg/dL   Creatinine, Ser 0.68 0.44 - 1.00 mg/dL   Calcium 8.8 (L) 8.9 - 10.3 mg/dL   Total Protein 5.7 (L) 6.5 - 8.1 g/dL   Albumin 3.3 (L) 3.5 - 5.0 g/dL   AST 24 15 - 41 U/L   ALT 21 0 - 44 U/L   Alkaline Phosphatase 80 38 - 126 U/L   Total Bilirubin 0.3 0.3 - 1.2 mg/dL   GFR, Estimated >60 >60 mL/min    Comment: (NOTE) Calculated using the CKD-EPI Creatinine Equation (2021)    Anion gap 9 5 - 15    Comment: Performed at [facility], [place] [address] [place], [place] [address]  Magnesium     Status: Abnormal   Collection Time: 02/21/23  9:00 PM  Result Value Ref Range   Magnesium 3.0 (H) 1.7 - 2.4 mg/dL    Comment: Performed at [facility] [address], [place], [place] [address]  I-Stat venous blood gas, [facility] ED, [facility], [facility])      Status: Abnormal   Collection Time: 02/21/23  9:26 PM  Result Value Ref Range   pH, Ven 7.412 7.25 - 7.43   pCO2, Ven 44.2 44 - 60 mmHg  pO2, Ven 33 32 - 45 mmHg   Bicarbonate 28.1 (H) 20.0 - 28.0 mmol/L   TCO2 29 22 - 32 mmol/L   O2 Saturation 65 %   Acid-Base Excess 3.0 (H) 0.0 - 2.0 mmol/L   Sodium 142 135 - 145 mmol/L   Potassium 3.4 (L) 3.5 - 5.1 mmol/L   Calcium, Ion 1.16 1.15 - 1.40 mmol/L   HCT 29.0 (L) 36.0 - 46.0 %   Hemoglobin 9.9 (L) 12.0 - 15.0 g/dL   Sample type VENOUS    Comment NOTIFIED PHYSICIAN   CBG monitoring, ED     Status: Abnormal   Collection Time: 02/21/23 11:01 PM  Result Value Ref Range   Glucose-Capillary 162 (H) 70 - 99 mg/dL    Comment: Glucose reference range applies only to samples taken after fasting for at least 8 hours.   Comment 1 Notify RN   Resp panel by RT-PCR (RSV, Flu A&B, Covid) Anterior Nasal Swab     Status: None   Collection Time: 02/21/23 11:19 PM   Specimen: Anterior Nasal Swab  Result Value Ref Range   SARS Coronavirus 2 by RT PCR NEGATIVE NEGATIVE   Influenza A by PCR NEGATIVE NEGATIVE   Influenza B by PCR NEGATIVE NEGATIVE    Comment: (NOTE) The Xpert Xpress SARS-CoV-2/FLU/RSV plus assay is intended as an aid in the diagnosis of influenza from Nasopharyngeal swab specimens and should not be used as a sole basis for treatment. Nasal washings and aspirates are unacceptable for Xpert Xpress SARS-CoV-2/FLU/RSV testing.  Fact Sheet for Patients: EntrepreneurPulse.com.au  Fact Sheet for Healthcare Providers: IncredibleEmployment.be  This test is not yet approved or cleared by the [place] FDA and has been authorized for detection and/or diagnosis of SARS-CoV-2 by FDA under an Emergency Use Authorization (EUA). This EUA will remain in effect (meaning this test can be used) for the duration of the COVID-19 declaration under Section 564(b)(1) of the Act, 21 U.S.C. section  360bbb-3(b)(1), unless the authorization is terminated or revoked.     Resp Syncytial Virus by PCR NEGATIVE NEGATIVE    Comment: (NOTE) Fact Sheet for Patients: EntrepreneurPulse.com.au  Fact Sheet for Healthcare Providers: IncredibleEmployment.be  This test is not yet approved or cleared by the [place] FDA and has been authorized for detection and/or diagnosis of SARS-CoV-2 by FDA under an Emergency Use Authorization (EUA). This EUA will remain in effect (meaning this test can be used) for the duration of the COVID-19 declaration under Section 564(b)(1) of the Act, 21 U.S.C. section 360bbb-3(b)(1), unless the authorization is terminated or revoked.  Performed at [facility], [place] [address] [place], [place] [id number]   Basic metabolic panel     Status: Abnormal   Collection Time: 02/22/23  1:16 AM  Result Value Ref Range   Sodium 141 135 - 145 mmol/L   Potassium 4.0 3.5 - 5.1 mmol/L   Chloride 107 98 - 111 mmol/L   CO2 25 22 - 32 mmol/L   Glucose, Bld 166 (H) 70 - 99 mg/dL    Comment: Glucose reference range applies only to samples taken after fasting for at least 8 hours.   BUN 8 6 - 20 mg/dL   Creatinine, Ser 0.64 0.44 - 1.00 mg/dL   Calcium 8.7 (L) 8.9 - 10.3 mg/dL   GFR, Estimated >60 >60 mL/min    Comment: (NOTE) Calculated using the CKD-EPI Creatinine Equation (2021)    Anion gap 9 5 - 15    Comment: Performed at [facility], [place] [address] [place], [place] [address]  CBC with Differential/Platelet     Status: Abnormal   Collection Time: 02/22/23  1:16 AM  Result Value Ref Range   WBC 10.7 (H) 4.0 - 10.5 K/uL   RBC 3.26 (L) 3.87 - 5.11 MIL/uL   Hemoglobin 10.2 (L) 12.0 - 15.0 g/dL   HCT 31.2 (L) 36.0 - 46.0 %   MCV 95.7 80.0 - 100.0 fL   MCH 31.3 26.0 - 34.0 pg   MCHC 32.7 30.0 - 36.0 g/dL   RDW 15.8 (H) 11.5 - 15.5 %   Platelets 310 150 - 400 K/uL   nRBC 0.0 0.0 - 0.2 %   Neutrophils Relative % 92 %    Neutro Abs 9.9 (H) 1.7 - 7.7 K/uL   Lymphocytes Relative 6 %   Lymphs Abs 0.6 (L) 0.7 - 4.0 K/uL   Monocytes Relative 1 %   Monocytes Absolute 0.1 0.1 - 1.0 K/uL   Eosinophils Relative 0 %   Eosinophils Absolute 0.0 0.0 - 0.5 K/uL   Basophils Relative 0 %   Basophils Absolute 0.0 0.0 - 0.1 K/uL   Immature Granulocytes 1 %   Abs Immature Granulocytes 0.06 0.00 - 0.07 K/uL    Comment: Performed at [facility], [place] [address], [place], [place] [address]  Magnesium     Status: None   Collection Time: 02/22/23  1:16 AM  Result Value Ref Range   Magnesium 2.3 1.7 - 2.4 mg/dL    Comment: Performed at [facility] [address], [place], [place] [address]  Carbamazepine level, total     Status: Abnormal   Collection Time: 02/22/23  1:19 AM  Result Value Ref Range   Carbamazepine Lvl 2.1 (L) 4.0 - 12.0 ug/mL    Comment: Performed at [facility] [address], [place], [place] [id number]  Basic metabolic panel     Status: Abnormal   Collection Time: 02/22/23  5:25 AM  Result Value Ref Range   Sodium 139 135 - 145 mmol/L   Potassium 3.9 3.5 - 5.1 mmol/L   Chloride 107 98 - 111 mmol/L   CO2 22 22 - 32 mmol/L   Glucose, Bld 181 (H) 70 - 99 mg/dL    Comment: Glucose reference range applies only to samples taken after fasting for at least 8 hours.   BUN 14 6 - 20 mg/dL   Creatinine, Ser 0.86 0.44 - 1.00 mg/dL   Calcium 9.3 8.9 - 10.3 mg/dL   GFR, Estimated >60 >60 mL/min    Comment: (NOTE) Calculated using the CKD-EPI Creatinine Equation (2021)    Anion gap 10 5 - 15    Comment: Performed at [facility] [address], [place], [place] [address]  CBC with Differential/Platelet     Status: Abnormal   Collection Time: 02/22/23  5:25 AM  Result Value Ref Range   WBC 6.2 4.0 - 10.5 K/uL   RBC 4.13 3.87 - 5.11 MIL/uL   Hemoglobin 12.6 12.0 - 15.0 g/dL   HCT 38.6 36.0 - 46.0 %   MCV 93.5 80.0 - 100.0 fL   MCH 30.5 26.0 - 34.0 pg   MCHC 32.6 30.0 - 36.0  g/dL   RDW 13.6 11.5 - 15.5 %   Platelets 327 150 - 400 K/uL   nRBC 0.3 (H) 0.0 - 0.2 %   Neutrophils Relative % 34 %   Neutro Abs 2.1 1.7 - 7.7  K/uL   Lymphocytes Relative 56 %   Lymphs Abs 3.4 0.7 - 4.0 K/uL   Monocytes Relative 7 %   Monocytes Absolute 0.4 0.1 - 1.0 K/uL   Eosinophils Relative 2 %   Eosinophils Absolute 0.2 0.0 - 0.5 K/uL   Basophils Relative 1 %   Basophils Absolute 0.0 0.0 - 0.1 K/uL   Immature Granulocytes 0 %   Abs Immature Granulocytes 0.02 0.00 - 0.07 K/uL    Comment: Performed at [facility] [address], [place], [place] [address]  D-dimer, quantitative     Status: None   Collection Time: 02/22/23  5:25 AM  Result Value Ref Range   D-Dimer, Quant <0.27 0.00 - 0.50 ug/mL-FEU    Comment: (NOTE) At the manufacturer cut-off value of 0.5 g/mL FEU, this assay has a negative predictive value of 95-100%.This assay is intended for use in conjunction with a clinical pretest probability (PTP) assessment model to exclude pulmonary embolism (PE) and deep venous thrombosis (DVT) in outpatients suspected of PE or DVT. Results should be correlated with clinical presentation. Performed at [facility], [place] [address] [place], [place] [address]   CBG monitoring, ED     Status: Abnormal   Collection Time: 02/22/23  8:53 AM  Result Value Ref Range   Glucose-Capillary 132 (H) 70 - 99 mg/dL    Comment: Glucose reference range applies only to samples taken after fasting for at least 8 hours.  CBG monitoring, ED     Status: None   Collection Time: 02/22/23 12:08 PM  Result Value Ref Range   Glucose-Capillary 91 70 - 99 mg/dL    Comment: Glucose reference range applies only to samples taken after fasting for at least 8 hours.   *Note: Due to a large number of results and/or encounters for the requested time period, some results have not been displayed. A complete set of results can be found in Results Review.   CT CHEST WO CONTRAST  Result Date:  02/22/2023 CLINICAL DATA:  62 year old female with history of hypoxia and dyspnea. EXAM: CT CHEST WITHOUT CONTRAST TECHNIQUE: Multidetector CT imaging of the chest was performed following the standard protocol without IV contrast. RADIATION DOSE REDUCTION: This exam was performed according to the departmental dose-optimization program which includes automated exposure control, adjustment of the mA and/or kV according to patient size and/or use of iterative reconstruction technique. COMPARISON:  Chest CTA 10/08/2022. FINDINGS: Cardiovascular: Heart size is normal. There is no significant pericardial fluid, thickening or pericardial calcification. There is aortic atherosclerosis, as well as atherosclerosis of the great vessels of the mediastinum and the coronary arteries, including calcified atherosclerotic plaque in the [name] coronary artery. Mediastinum/Nodes: No pathologically enlarged mediastinal or hilar lymph nodes. Please note that accurate exclusion of hilar adenopathy is limited on noncontrast CT scans. Esophagus is unremarkable in appearance. No axillary lymphadenopathy. Lungs/Pleura: No acute consolidative airspace disease. No pleural effusions. No suspicious appearing pulmonary nodules or masses are noted. Diffuse bronchial wall thickening with mild to moderate centrilobular and paraseptal emphysema. Upper Abdomen: Atherosclerosis in the abdominal aorta. Visualized liver has a slightly nodular contour, suggesting underlying cirrhosis. Musculoskeletal: Orthopedic fixation hardware in the lower cervical spine incidentally noted. There are no aggressive appearing lytic or blastic lesions noted in the visualized portions of the skeleton. IMPRESSION: 1. No acute findings in the thorax. 2. Aortic atherosclerosis, in addition to [name] coronary artery disease. Please note that although the presence of coronary artery calcium documents the presence of coronary  artery disease, the severity of this disease and any  potential stenosis cannot be assessed on this non-gated CT examination. Assessment for potential risk factor modification, dietary therapy or pharmacologic therapy may be warranted, if clinically indicated. 3. Diffuse bronchial wall thickening with mild to moderate centrilobular and paraseptal emphysema; imaging findings suggestive of underlying COPD. 4. Morphologic changes in the liver suggesting underlying cirrhosis. Aortic Atherosclerosis (ICD10-I70.0) and Emphysema (ICD10-J43.9). Electronically Signed   By: Vinnie Langton M.D.   On: 02/22/2023 05:34   DG Chest Portable 1 View  Result Date: 02/21/2023 CLINICAL DATA:  Shortness of breath EXAM: PORTABLE CHEST 1 VIEW COMPARISON:  01/10/23 CXR FINDINGS: No pleural effusion. No pneumothorax. Normal cardiac and mediastinal contours. No focal airspace opacity. No radiographically apparent displaced rib fractures. Partially visualized cervical spinal fusion hardware in place. Visualized upper abdomen is unremarkable. IMPRESSION: No focal airspace opacity. Electronically Signed   By: Marin Roberts M.D.   On: 02/21/2023 21:59    Pending Labs Unresulted Labs (From admission, onward)     Start     Ordered   03/01/23 0500  Creatinine, serum  (enoxaparin (LOVENOX)    CrCl >/= 30 ml/min)  Weekly,   R     Comments: while on enoxaparin therapy    02/22/23 0428   02/22/23 0428  CBC  (enoxaparin (LOVENOX)    CrCl >/= 30 ml/min)  Once,   R       Comments: Baseline for enoxaparin therapy IF NOT ALREADY DRAWN.  Notify MD if PLT < 100 K.    02/22/23 0428            Vitals/Pain Today's Vitals   02/22/23 0950 02/22/23 0951 02/22/23 1357 02/22/23 1359  BP:  (!) 147/89    Pulse:  91    Resp:  15    Temp:  97.8 F (36.6 C)  98.1 F (36.7 C)  TempSrc:  Oral  Oral  SpO2:  94%    Weight:      Height:      PainSc: 10-Worst pain ever  10-Worst pain ever     Isolation Precautions No active isolations  Medications Medications  ipratropium-albuterol  (DUONEB) 0.5-2.5 (3) MG/3ML nebulizer solution 3 mL (3 mLs Nebulization Not Given 02/21/23 2125)  insulin aspart (novoLOG) injection 0-15 Units ( Subcutaneous Not Given 02/22/23 1215)  insulin aspart (novoLOG) injection 0-5 Units ( Subcutaneous Not Given 02/21/23 2307)  atorvastatin (LIPITOR) tablet 40 mg (40 mg Oral Not Given 02/22/23 0214)  famotidine (PEPCID) tablet 40 mg (40 mg Oral Not Given 02/22/23 0216)  ipratropium-albuterol (DUONEB) 0.5-2.5 (3) MG/3ML nebulizer solution 3 mL (has no administration in time range)  montelukast (SINGULAIR) tablet 10 mg (10 mg Oral Given 02/22/23 0222)  pantoprazole (PROTONIX) EC tablet 40 mg (40 mg Oral Given 02/22/23 1342)  revefenacin (YUPELRI) nebulizer solution 175 mcg (175 mcg Nebulization Not Given 02/22/23 1141)  irbesartan (AVAPRO) tablet 75 mg (75 mg Oral Given 02/22/23 1433)  predniSONE (DELTASONE) tablet 40 mg (40 mg Oral Given 02/22/23 1433)  albuterol (PROVENTIL) (2.5 MG/3ML) 0.083% nebulizer solution 2.5 mg (2.5 mg Nebulization Given 02/22/23 1350)  dextromethorphan-guaiFENesin (MUCINEX DM) 30-600 MG per 12 hr tablet 1 tablet (1 tablet Oral Given 02/22/23 1433)  fluticasone furoate-vilanterol (BREO ELLIPTA) 100-25 MCG/ACT 1 puff (1 puff Inhalation Not Given 02/22/23 0950)    And  umeclidinium bromide (INCRUSE ELLIPTA) 62.5 MCG/ACT 1 puff (1 puff Inhalation Not Given 02/22/23 0950)  carbamazepine (TEGRETOL) tablet 400 mg (400 mg Oral Given 02/22/23 1343)  And  carbamazepine (TEGRETOL) tablet 600 mg (600 mg Oral Not Given 02/22/23 0215)  senna-docusate (Senokot-S) tablet 1 tablet (has no administration in time range)  ondansetron (ZOFRAN) tablet 4 mg ( Oral See Alternative 02/22/23 1232)    Or  ondansetron (ZOFRAN) injection 4 mg (4 mg Intravenous Given 02/22/23 1232)  enoxaparin (LOVENOX) injection 40 mg (40 mg Subcutaneous Given 02/22/23 1345)  acetaminophen (TYLENOL) tablet 650 mg (has no administration in time range)    Or  acetaminophen (TYLENOL) suppository 650 mg  (has no administration in time range)  doxycycline (VIBRA-TABS) tablet 200 mg (200 mg Oral Given 02/22/23 0702)    Followed by  doxycycline (VIBRA-TABS) tablet 100 mg (has no administration in time range)  butalbital-acetaminophen-caffeine (FIORICET) 50-325-40 MG per tablet 1 tablet (1 tablet Oral Given 02/22/23 1234)  hydrochlorothiazide (HYDRODIURIL) tablet 12.5 mg (12.5 mg Oral Given 02/22/23 1343)  hydrOXYzine (VISTARIL) capsule 25 mg (has no administration in time range)  ferrous sulfate tablet 325 mg (has no administration in time range)  gabapentin (NEURONTIN) capsule 100 mg (100 mg Oral Given 02/22/23 1342)  cholecalciferol (VITAMIN D3) 25 MCG (1000 UNIT) tablet 5,000 Units (5,000 Units Oral Given 02/22/23 1408)  albuterol (PROVENTIL) (2.5 MG/3ML) 0.083% nebulizer solution 2.5 mg (has no administration in time range)  fluticasone (FLONASE) 50 MCG/ACT nasal spray 1 spray (has no administration in time range)  ketotifen (ZADITOR) 0.035 % ophthalmic solution 1 drop (has no administration in time range)  oxyCODONE (Oxy IR/ROXICODONE) immediate release tablet 5 mg (has no administration in time range)  ipratropium-albuterol (DUONEB) 0.5-2.5 (3) MG/3ML nebulizer solution 3 mL (3 mLs Nebulization Given 02/21/23 2125)  azithromycin (ZITHROMAX) 500 mg in sodium chloride 0.9 % 250 mL IVPB (0 mg Intravenous Stopped 02/22/23 0017)  oxyCODONE (Oxy IR/ROXICODONE) immediate release tablet 5 mg (5 mg Oral Given 02/22/23 0554)    Mobility walks     Focused Assessments Pulmonary Assessment Handoff:  Lung sounds: Bilateral Breath Sounds: Diminished O2 Device: Room Air      R Recommendations: See Admitting Provider Note  Report given to:   Additional Notes: pt when she exert herself she have shortness of breath and will start wheezing. She is c/o of migraine HA, She said tylenol does not work. The Dr ordered Fiocricet and she received 1 tablet, but pt said it's not working. [name] now she is resting the  lights bother her. If you can keep the room dark.

## 2023-02-22 NOTE — Telephone Encounter (Signed)
Wanted Dr. To know she is at [facility] for breathing issues. Room 2C-13.Thanks.

## 2023-02-22 NOTE — Progress Notes (Signed)
PROGRESS NOTE    [name]  [id number] DOB: 03/25/1961 DOA: 02/21/2023 PCP: Biagio Borg, MD   Brief Narrative:  HPI: This is a 62 year old female with past medical history of COPD, hypertension, bipolar, fibromyalgia.  Patient recently admitted in January with influenza, COPD exacerbation and respiratory failure.  Per patient since discharge she has not been well.  She has been back-and-forth between her pulmonologist and her PCP without improvement.  Per patient she remains quite dyspneic and short of breath.  Walking from 1 room to the other side she is very short of breath.  Her shortness of breath was significant prior however she managed, not anymore.  She endorses a chronic wheeze which is getting progressively worse.  She states anything will trigger her wheezing or shortness of breath.  She cites fumes, colognes, vehicle fumes, and history, perfume, smoke.  She states she is not even able to shop for groceries because of the effects of different scents.  She is not able to cook.  She states when she becomes a short of breath she has become incontinent.  This occurred the last time she went grocery shopping.  She denies fevers or chills.  She endorses a headache which she states started in the ER after an employee came in with a perfume on.  She denies fevers or chills.  She was brought to the ER because of her worsening shortness of breath.   In the ambulance, patient given steroids and nebs.  Her lungs were quite without wheeze in the ER but patient with shortness of breath.  She had increased work of breathing.  She was placed on the BiPAP, patient appeared more comfortable, her work of breathing decreased.  Admission requested    Assessment & Plan:   Active Problems:   Chronic respiratory failure with hypoxia and hypercapnia (HCC)   Seizure disorder (HCC)   Essential (primary) hypertension   Fibromyalgia   Bipolar I disorder (HCC)   History of stroke   Acute  exacerbation of COPD with asthma ([place])  Acute COPD exacerbation-acute asthma overlap syndrome: Patient typically uses BiPAP at night.  She says that she is still feeling short of breath, no better than yesterday.  She feels short of breath by going from 1 room to another.  Interestingly, she is saturating 97% on room air but continues to feel short of breath.  On exam, has very diminished breath sounds but no wheezes.  She does not feel comfortable going home.  We will continue prednisone and bronchodilators and antibiotics.  Reassess in the morning.  Essential hypertension: Controlled.  Continue current regimen.  Hyperlipidemia: Atorvastatin.  GERD: Continue Protonix and Pepcid.  Bipolar disorder: Continue Tegretol.  Level is low.  Will defer to her physician as outpatient.  Chronic headache: Resume Fioricet.  DVT prophylaxis: enoxaparin (LOVENOX) injection 40 mg Start: 02/22/23 1000 SCDs Start: 02/22/23 0114   Code Status: Full Code  Family Communication:  None present at bedside.  Plan of care discussed with patient in length and he/she verbalized understanding and agreed with it.  Status is: Inpatient Remains inpatient appropriate because: Patient is still symptomatic.   Estimated body mass index is 30.23 kg/m as calculated from the following:   Height as of this encounter: '5\' 7"'$  (1.702 m).   Weight as of this encounter: 87.5 kg.    Nutritional Assessment: Body mass index is 30.23 kg/m.Marland Kitchen Seen by dietician.  I agree with the assessment and plan as outlined below: Nutrition Status:        .  Skin Assessment: I have examined the patient's skin and I agree with the wound assessment as performed by the wound care RN as outlined below:    Consultants:  None  Procedures:  None  Antimicrobials:  Anti-infectives (From admission, onward)    Start     Dose/Rate Route Frequency Ordered Stop   02/22/23 2200  doxycycline (VIBRA-TABS) tablet 100 mg       See Hyperspace for  full Linked Orders Report.   100 mg Oral Every 12 hours 02/22/23 0644     02/22/23 0645  doxycycline (VIBRA-TABS) tablet 200 mg       See Hyperspace for full Linked Orders Report.   200 mg Oral  Once 02/22/23 0643 02/22/23 0702   02/21/23 2215  azithromycin (ZITHROMAX) 500 mg in sodium chloride 0.9 % 250 mL IVPB        500 mg 250 mL/hr over 60 Minutes Intravenous  Once 02/21/23 2213 02/22/23 0017         Subjective: Patient seen and examined.  She still complains of shortness of breath, no better than yesterday.  Objective: Vitals:   02/22/23 0223 02/22/23 0408 02/22/23 0408 02/22/23 0651  BP:    (!) 142/74  Pulse: 82 86  88  Resp: '18 16  18  '$ Temp:   98.3 F (36.8 C) 98.2 F (36.8 C)  TempSrc:   Oral Oral  SpO2: 94% 96%  97%  Weight:      Height:        Intake/Output Summary (Last 24 hours) at 02/22/2023 0945 Last data filed at 02/22/2023 0017 Gross per 24 hour  Intake 251.87 ml  Output --  Net 251.87 ml   Filed Weights   02/21/23 2058  Weight: 87.5 kg    Examination:  General exam: Appears calm and comfortable  Respiratory system: Diminished breath sounds, no wheezes or rhonchi.  Respiratory effort normal. Cardiovascular system: S1 & S2 heard, RRR. No JVD, murmurs, rubs, gallops or clicks. No pedal edema. Gastrointestinal system: Abdomen is nondistended, soft and nontender. No organomegaly or masses felt. Normal bowel sounds heard. Central nervous system: Alert and oriented. No focal neurological deficits. Extremities: Symmetric 5 x 5 power. Skin: No rashes, lesions or ulcers Psychiatry: Judgement and insight appear normal. Mood & affect appropriate.    Data Reviewed: I have personally reviewed following labs and imaging studies  CBC: Recent Labs  Lab 02/21/23 2100 02/21/23 2126 02/22/23 0116 02/22/23 0525  WBC 12.1*  --  10.7* 6.2  NEUTROABS 7.5  --  9.9* 2.1  HGB 10.3* 9.9* 10.2* 12.6  HCT 31.6* 29.0* 31.2* 38.6  MCV 96.9  --  95.7 93.5  PLT 329  --   310 [id number]   Basic Metabolic Panel: Recent Labs  Lab 02/21/23 2100 02/21/23 2126 02/22/23 0116 02/22/23 0525  NA 141 142 141 139  K 3.6 3.4* 4.0 3.9  CL 105  --  107 107  CO2 27  --  25 22  GLUCOSE 171*  --  166* 181*  BUN 7  --  8 14  CREATININE 0.68  --  0.64 0.86  CALCIUM 8.8*  --  8.7* 9.3  MG 3.0*  --  2.3  --    GFR: Estimated Creatinine Clearance: 79.1 mL/min (by C-G formula based on SCr of 0.86 mg/dL). Liver Function Tests: Recent Labs  Lab 02/21/23 2100  AST 24  ALT 21  ALKPHOS 80  BILITOT 0.3  PROT 5.7*  ALBUMIN 3.3*   No results for  input(s): "LIPASE", "AMYLASE" in the last 168 hours. No results for input(s): "AMMONIA" in the last 168 hours. Coagulation Profile: No results for input(s): "INR", "PROTIME" in the last 168 hours. Cardiac Enzymes: No results for input(s): "CKTOTAL", "CKMB", "CKMBINDEX", "TROPONINI" in the last 168 hours. BNP (last 3 results) No results for input(s): "PROBNP" in the last 8760 hours. HbA1C: No results for input(s): "HGBA1C" in the last 72 hours. CBG: Recent Labs  Lab 02/21/23 2301 02/22/23 0853  GLUCAP 162* 132*   Lipid Profile: No results for input(s): "CHOL", "HDL", "LDLCALC", "TRIG", "CHOLHDL", "LDLDIRECT" in the last 72 hours. Thyroid Function Tests: No results for input(s): "TSH", "T4TOTAL", "FREET4", "T3FREE", "THYROIDAB" in the last 72 hours. Anemia Panel: No results for input(s): "VITAMINB12", "FOLATE", "FERRITIN", "TIBC", "IRON", "RETICCTPCT" in the last 72 hours. Sepsis Labs: No results for input(s): "PROCALCITON", "LATICACIDVEN" in the last 168 hours.  Recent Results (from the past 240 hour(s))  Resp panel by RT-PCR (RSV, Flu A&B, Covid) Anterior Nasal Swab     Status: None   Collection Time: 02/21/23 11:19 PM   Specimen: Anterior Nasal Swab  Result Value Ref Range Status   SARS Coronavirus 2 by RT PCR NEGATIVE NEGATIVE Final   Influenza A by PCR NEGATIVE NEGATIVE Final   Influenza B by PCR NEGATIVE  NEGATIVE Final    Comment: (NOTE) The Xpert Xpress SARS-CoV-2/FLU/RSV plus assay is intended as an aid in the diagnosis of influenza from Nasopharyngeal swab specimens and should not be used as a sole basis for treatment. Nasal washings and aspirates are unacceptable for Xpert Xpress SARS-CoV-2/FLU/RSV testing.  Fact Sheet for Patients: EntrepreneurPulse.com.au  Fact Sheet for Healthcare Providers: IncredibleEmployment.be  This test is not yet approved or cleared by the [place] FDA and has been authorized for detection and/or diagnosis of SARS-CoV-2 by FDA under an Emergency Use Authorization (EUA). This EUA will remain in effect (meaning this test can be used) for the duration of the COVID-19 declaration under Section 564(b)(1) of the Act, 21 U.S.C. section 360bbb-3(b)(1), unless the authorization is terminated or revoked.     Resp Syncytial Virus by PCR NEGATIVE NEGATIVE Final    Comment: (NOTE) Fact Sheet for Patients: EntrepreneurPulse.com.au  Fact Sheet for Healthcare Providers: IncredibleEmployment.be  This test is not yet approved or cleared by the [place] FDA and has been authorized for detection and/or diagnosis of SARS-CoV-2 by FDA under an Emergency Use Authorization (EUA). This EUA will remain in effect (meaning this test can be used) for the duration of the COVID-19 declaration under Section 564(b)(1) of the Act, 21 U.S.C. section 360bbb-3(b)(1), unless the authorization is terminated or revoked.  Performed at [facility], [place] [address], [place], [place] [address]      Radiology Studies: CT CHEST WO CONTRAST  Result Date: 02/22/2023 CLINICAL DATA:  62 year old female with history of hypoxia and dyspnea. EXAM: CT CHEST WITHOUT CONTRAST TECHNIQUE: Multidetector CT imaging of the chest was performed following the standard protocol without IV contrast. RADIATION DOSE  REDUCTION: This exam was performed according to the departmental dose-optimization program which includes automated exposure control, adjustment of the mA and/or kV according to patient size and/or use of iterative reconstruction technique. COMPARISON:  Chest CTA 10/08/2022. FINDINGS: Cardiovascular: Heart size is normal. There is no significant pericardial fluid, thickening or pericardial calcification. There is aortic atherosclerosis, as well as atherosclerosis of the great vessels of the mediastinum and the coronary arteries, including calcified atherosclerotic plaque in the right coronary artery. Mediastinum/Nodes: No pathologically enlarged mediastinal  or hilar lymph nodes. Please note that accurate exclusion of hilar adenopathy is limited on noncontrast CT scans. Esophagus is unremarkable in appearance. No axillary lymphadenopathy. Lungs/Pleura: No acute consolidative airspace disease. No pleural effusions. No suspicious appearing pulmonary nodules or masses are noted. Diffuse bronchial wall thickening with mild to moderate centrilobular and paraseptal emphysema. Upper Abdomen: Atherosclerosis in the abdominal aorta. Visualized liver has a slightly nodular contour, suggesting underlying cirrhosis. Musculoskeletal: Orthopedic fixation hardware in the lower cervical spine incidentally noted. There are no aggressive appearing lytic or blastic lesions noted in the visualized portions of the skeleton. IMPRESSION: 1. No acute findings in the thorax. 2. Aortic atherosclerosis, in addition to right coronary artery disease. Please note that although the presence of coronary artery calcium documents the presence of coronary artery disease, the severity of this disease and any potential stenosis cannot be assessed on this non-gated CT examination. Assessment for potential risk factor modification, dietary therapy or pharmacologic therapy may be warranted, if clinically indicated. 3. Diffuse bronchial wall thickening with  mild to moderate centrilobular and paraseptal emphysema; imaging findings suggestive of underlying COPD. 4. Morphologic changes in the liver suggesting underlying cirrhosis. Aortic Atherosclerosis (ICD10-I70.0) and Emphysema (ICD10-J43.9). Electronically Signed   By: Vinnie Langton M.D.   On: 02/22/2023 05:34   DG Chest Portable 1 View  Result Date: 02/21/2023 CLINICAL DATA:  Shortness of breath EXAM: PORTABLE CHEST 1 VIEW COMPARISON:  01/10/23 CXR FINDINGS: No pleural effusion. No pneumothorax. Normal cardiac and mediastinal contours. No focal airspace opacity. No radiographically apparent displaced rib fractures. Partially visualized cervical spinal fusion hardware in place. Visualized upper abdomen is unremarkable. IMPRESSION: No focal airspace opacity. Electronically Signed   By: Marin Roberts M.D.   On: 02/21/2023 21:59    Scheduled Meds:  albuterol  2.5 mg Nebulization Q6H   atorvastatin  40 mg Oral QHS   carbamazepine  400 mg Oral Daily   And   carbamazepine  600 mg Oral QHS   dextromethorphan-guaiFENesin  1 tablet Oral BID   doxycycline  100 mg Oral Q12H   dupilumab  300 mg Subcutaneous Q14 Days   enoxaparin (LOVENOX) injection  40 mg Subcutaneous Q24H   famotidine  40 mg Oral QHS   fluticasone furoate-vilanterol  1 puff Inhalation Daily   And   umeclidinium bromide  1 puff Inhalation Daily   insulin aspart  0-15 Units Subcutaneous TID WC   insulin aspart  0-5 Units Subcutaneous QHS   ipratropium-albuterol  3 mL Nebulization Once   irbesartan  75 mg Oral Daily   montelukast  10 mg Oral QHS   pantoprazole  40 mg Oral BID   predniSONE  40 mg Oral Q breakfast   revefenacin  175 mcg Nebulization Daily   Continuous Infusions:   LOS: 0 days   Darliss Cheney, MD [facility]  02/22/2023, 9:45 AM   *Please note that this is a verbal dictation therefore any spelling or grammatical errors are due to the "[place] [employer]" system interpretation.  Please page via [place] and do  not message via secure chat for urgent patient care matters. Secure chat can be used for non urgent patient care matters.  How to contact the [facility] Attending or Consulting provider [place] or covering provider during after hours [place], for this patient?  Check the care team in [facility] and look for a) attending/consulting [facility] provider listed and b) the [facility] team listed. Page or secure chat [phone]. Log into [email or web address] and use [place]'s  universal password to access. If you do not have the password, please contact the hospital operator. Locate the [facility] provider you are looking for under [facility] and page to a number that you can be directly reached. If you still have difficulty reaching the provider, please page the [facility] (Director on Call) for the Hospitalists listed on amion for assistance.

## 2023-02-23 DIAGNOSIS — F319 Bipolar disorder, unspecified: Secondary | ICD-10-CM

## 2023-02-23 DIAGNOSIS — Z8673 Personal history of transient ischemic attack (TIA), and cerebral infarction without residual deficits: Secondary | ICD-10-CM

## 2023-02-23 DIAGNOSIS — G40909 Epilepsy, unspecified, not intractable, without status epilepticus: Secondary | ICD-10-CM

## 2023-02-23 DIAGNOSIS — M797 Fibromyalgia: Secondary | ICD-10-CM

## 2023-02-23 DIAGNOSIS — J441 Chronic obstructive pulmonary disease with (acute) exacerbation: Secondary | ICD-10-CM | POA: Diagnosis not present

## 2023-02-23 DIAGNOSIS — J9611 Chronic respiratory failure with hypoxia: Secondary | ICD-10-CM | POA: Diagnosis not present

## 2023-02-23 DIAGNOSIS — I10 Essential (primary) hypertension: Secondary | ICD-10-CM

## 2023-02-23 LAB — GLUCOSE, CAPILLARY
Glucose-Capillary: 105 mg/dL — ABNORMAL HIGH (ref 70–99)
Glucose-Capillary: 114 mg/dL — ABNORMAL HIGH (ref 70–99)
Glucose-Capillary: 138 mg/dL — ABNORMAL HIGH (ref 70–99)
Glucose-Capillary: 194 mg/dL — ABNORMAL HIGH (ref 70–99)

## 2023-02-23 LAB — BRAIN NATRIURETIC PEPTIDE: B Natriuretic Peptide: 21.1 pg/mL (ref 0.0–100.0)

## 2023-02-23 LAB — TROPONIN I (HIGH SENSITIVITY)
Troponin I (High Sensitivity): 3 ng/L (ref <18)
Troponin I (High Sensitivity): 3 ng/L (ref <18)

## 2023-02-23 MED ORDER — BUDESONIDE 0.5 MG/2ML IN SUSP
0.5000 mg | Freq: Two times a day (BID) | RESPIRATORY_TRACT | Status: DC
  Administered 2023-02-23 – 2023-02-27 (×6): 0.5 mg via RESPIRATORY_TRACT
  Filled 2023-02-23 (×9): qty 2

## 2023-02-23 MED ORDER — IPRATROPIUM-ALBUTEROL 0.5-2.5 (3) MG/3ML IN SOLN
3.0000 mL | Freq: Four times a day (QID) | RESPIRATORY_TRACT | Status: DC
  Administered 2023-02-23 – 2023-02-25 (×6): 3 mL via RESPIRATORY_TRACT
  Filled 2023-02-23 (×7): qty 3

## 2023-02-23 MED ORDER — METHYLPREDNISOLONE SODIUM SUCC 40 MG IJ SOLR
40.0000 mg | Freq: Two times a day (BID) | INTRAMUSCULAR | Status: DC
  Administered 2023-02-23 – 2023-02-26 (×6): 40 mg via INTRAVENOUS
  Filled 2023-02-23 (×6): qty 1

## 2023-02-23 NOTE — Progress Notes (Addendum)
PROGRESS NOTE    [name]  [id number] DOB: [birth year] DOA: 02/21/2023 PCP: Biagio Borg, MD   Brief Narrative:  HPI: This is a 62 year old female with past medical history of COPD, hypertension, bipolar, fibromyalgia.  Patient recently admitted in January with influenza, COPD exacerbation and respiratory failure.  Per patient since discharge she has not been well.  She has been back-and-forth between her pulmonologist and her PCP without improvement.  Per patient she remains quite dyspneic and short of breath.  Walking from 1 room to the other side she is very short of breath.  Her shortness of breath was significant prior however she managed, not anymore.  She endorses a chronic wheeze which is getting progressively worse.  She states anything will trigger her wheezing or shortness of breath.  She cites fumes, colognes, vehicle fumes, and history, perfume, smoke.  She states she is not even able to shop for groceries because of the effects of different scents.  She is not able to cook.  She states when she becomes a short of breath she has become incontinent.  This occurred the last time she went grocery shopping.  She denies fevers or chills.  She endorses a headache which she states started in the ER after an employee came in with a perfume on.  She denies fevers or chills.  She was brought to the ER because of her worsening shortness of breath.   In the ambulance, patient given steroids and nebs.  Her lungs were quite without wheeze in the ER but patient with shortness of breath.  She had increased work of breathing.  She was placed on the BiPAP, patient appeared more comfortable, her work of breathing decreased.  Admission requested  02/23/2023: Patient continues to report dyspnea on minimal exertion.  Wheezing seems to be improving.  Previous CT chest revealed findings suggestive of COPD, as well as, aortic atherosclerosis, atherosclerosis of RCA and abdominal vessels.   Findings suggestive of liver cirrhosis also noted.  Last echocardiogram was in 2012.  Will proceed with echocardiogram, cardiac BNP and troponins.  Have a low threshold to consult cardiology team.  Continue management for COPD with exacerbation.    Assessment & Plan:   Active Problems:   Fibromyalgia   Bipolar I disorder (HCC)   Seizure disorder (HCC)   History of stroke   Essential (primary) hypertension   Chronic respiratory failure with hypoxia and hypercapnia (HCC)   Acute exacerbation of COPD with asthma ([place])  Acute COPD exacerbation-acute asthma overlap syndrome: Patient typically uses BiPAP at night.  She says that she is still feeling short of breath, no better than yesterday.  She feels short of breath by going from 1 room to another.  Interestingly, she is saturating 97% on room air but continues to feel short of breath.  On exam, has very diminished breath sounds but no wheezes.  She does not feel comfortable going home.  We will continue prednisone and bronchodilators and antibiotics.  Reassess in the morning. 02/23/2023: Will review medications.  Patient will be on steroids IV, nebs Pulmicort, nebs DuoNeb.  Will continue montelukast.  Peak flow daily.  Essential hypertension: Controlled.  Continue current regimen.  Hyperlipidemia: Atorvastatin.  GERD: Continue Protonix and Pepcid.  Bipolar disorder: Continue Tegretol.  Level is low.  Will defer to her physician as outpatient.  Chronic headache: Resume Fioricet.  Dyspnea on minimal exertion: -Check BMP -Cycle troponin -Echocardiogram -CT chest finding noted.  Patient has atherosclerotic heart disease.  No  pressure to consult cardiology team.  Obesity: -Diet and exercise.  Liver cirrhosis: -Check LFTs in the morning. -Acute hepatitis panel in the morning. -Suspect secondary to NASH.  DVT prophylaxis:  Subcutaneous Lovenox.    Code Status: Full Code   Family Communication:    Status is: Inpatient Remains  inpatient appropriate because: Patient is still symptomatic.   Estimated body mass index is 34.41 kg/m as calculated from the following:   Height as of this encounter: '5\' 3"'$  (1.6 m).   Weight as of this encounter: 88.1 kg.    Nutritional Assessment: Body mass index is 34.41 kg/m.Marland Kitchen Seen by dietician.  I agree with the assessment and plan as outlined below: Nutrition Status:        . Skin Assessment: I have examined the patient's skin and I agree with the wound assessment as performed by the wound care RN as outlined below:    Consultants:  None  Procedures:  None  Antimicrobials:  Anti-infectives (From admission, onward)    Start     Dose/Rate Route Frequency Ordered Stop   02/22/23 2200  doxycycline (VIBRA-TABS) tablet 100 mg       See Hyperspace for full Linked Orders Report.   100 mg Oral Every 12 hours 02/22/23 0644     02/22/23 0645  doxycycline (VIBRA-TABS) tablet 200 mg       See Hyperspace for full Linked Orders Report.   200 mg Oral  Once 02/22/23 0643 02/22/23 0702   02/21/23 2215  azithromycin (ZITHROMAX) 500 mg in sodium chloride 0.9 % 250 mL IVPB        500 mg 250 mL/hr over 60 Minutes Intravenous  Once 02/21/23 2213 02/22/23 0017         Subjective: Patient seen and examined.  She still complains of shortness of breath, no better than yesterday.  Objective: Vitals:   02/23/23 0312 02/23/23 0723 02/23/23 0757 02/23/23 1059  BP: 121/70 107/76  130/89  Pulse: 70 80  83  Resp: '18 17  17  '$ Temp: 98.3 F (36.8 C) 98.9 F (37.2 C)  98.3 F (36.8 C)  TempSrc: Oral Oral  Oral  SpO2: 92% 92% 95% 96%  Weight:      Height:        Intake/Output Summary (Last 24 hours) at 02/23/2023 1501 Last data filed at 02/23/2023 1235 Gross per 24 hour  Intake 240 ml  Output 1300 ml  Net -1060 ml    Filed Weights   02/21/23 2058 02/22/23 1531  Weight: 87.5 kg 88.1 kg    Examination:  General exam: Appears calm and comfortable  Respiratory system:  Diminished breath sounds, no wheezes or rhonchi.  Respiratory effort normal. Cardiovascular system: S1 & S2 heard, RRR. No JVD, murmurs, rubs, gallops or clicks. No pedal edema. Gastrointestinal system: Abdomen is nondistended, soft and nontender. No organomegaly or masses felt. Normal bowel sounds heard. Central nervous system: Alert and oriented. No focal neurological deficits. Extremities: Symmetric 5 x 5 power. Skin: No rashes, lesions or ulcers Psychiatry: Judgement and insight appear normal. Mood & affect appropriate.    Data Reviewed: I have personally reviewed following labs and imaging studies  CBC: Recent Labs  Lab 02/21/23 2100 02/21/23 2126 02/22/23 0116 02/22/23 0525  WBC 12.1*  --  10.7* 6.2  NEUTROABS 7.5  --  9.9* 2.1  HGB 10.3* 9.9* 10.2* 12.6  HCT 31.6* 29.0* 31.2* 38.6  MCV 96.9  --  95.7 93.5  PLT 329  --  310 327  Basic Metabolic Panel: Recent Labs  Lab 02/21/23 2100 02/21/23 2126 02/22/23 0116 02/22/23 0525  NA 141 142 141 139  K 3.6 3.4* 4.0 3.9  CL 105  --  107 107  CO2 27  --  25 22  GLUCOSE 171*  --  166* 181*  BUN 7  --  8 14  CREATININE 0.68  --  0.64 0.86  CALCIUM 8.8*  --  8.7* 9.3  MG 3.0*  --  2.3  --     GFR: Estimated Creatinine Clearance: 73.2 mL/min (by C-G formula based on SCr of 0.86 mg/dL). Liver Function Tests: Recent Labs  Lab 02/21/23 2100  AST 24  ALT 21  ALKPHOS 80  BILITOT 0.3  PROT 5.7*  ALBUMIN 3.3*    No results for input(s): "LIPASE", "AMYLASE" in the last 168 hours. No results for input(s): "AMMONIA" in the last 168 hours. Coagulation Profile: No results for input(s): "INR", "PROTIME" in the last 168 hours. Cardiac Enzymes: No results for input(s): "CKTOTAL", "CKMB", "CKMBINDEX", "TROPONINI" in the last 168 hours. BNP (last 3 results) No results for input(s): "PROBNP" in the last 8760 hours. HbA1C: No results for input(s): "HGBA1C" in the last 72 hours. CBG: Recent Labs  Lab 02/22/23 1208  02/22/23 1534 02/22/23 2116 02/23/23 0532 02/23/23 1100  GLUCAP 91 127* 200* 105* 114*    Lipid Profile: No results for input(s): "CHOL", "HDL", "LDLCALC", "TRIG", "CHOLHDL", "LDLDIRECT" in the last 72 hours. Thyroid Function Tests: No results for input(s): "TSH", "T4TOTAL", "FREET4", "T3FREE", "THYROIDAB" in the last 72 hours. Anemia Panel: No results for input(s): "VITAMINB12", "FOLATE", "FERRITIN", "TIBC", "IRON", "RETICCTPCT" in the last 72 hours. Sepsis Labs: No results for input(s): "PROCALCITON", "LATICACIDVEN" in the last 168 hours.  Recent Results (from the past 240 hour(s))  Resp panel by RT-PCR (RSV, Flu A&B, Covid) Anterior Nasal Swab     Status: None   Collection Time: 02/21/23 11:19 PM   Specimen: Anterior Nasal Swab  Result Value Ref Range Status   SARS Coronavirus 2 by RT PCR NEGATIVE NEGATIVE Final   Influenza A by PCR NEGATIVE NEGATIVE Final   Influenza B by PCR NEGATIVE NEGATIVE Final    Comment: (NOTE) The Xpert Xpress SARS-CoV-2/FLU/RSV plus assay is intended as an aid in the diagnosis of influenza from Nasopharyngeal swab specimens and should not be used as a sole basis for treatment. Nasal washings and aspirates are unacceptable for Xpert Xpress SARS-CoV-2/FLU/RSV testing.  Fact Sheet for Patients: EntrepreneurPulse.com.au  Fact Sheet for Healthcare Providers: IncredibleEmployment.be  This test is not yet approved or cleared by the [place] FDA and has been authorized for detection and/or diagnosis of SARS-CoV-2 by FDA under an Emergency Use Authorization (EUA). This EUA will remain in effect (meaning this test can be used) for the duration of the COVID-19 declaration under Section 564(b)(1) of the Act, 21 U.S.C. section 360bbb-3(b)(1), unless the authorization is terminated or revoked.     Resp Syncytial Virus by PCR NEGATIVE NEGATIVE Final    Comment: (NOTE) Fact Sheet for  Patients: EntrepreneurPulse.com.au  Fact Sheet for Healthcare Providers: IncredibleEmployment.be  This test is not yet approved or cleared by the [place] FDA and has been authorized for detection and/or diagnosis of SARS-CoV-2 by FDA under an Emergency Use Authorization (EUA). This EUA will remain in effect (meaning this test can be used) for the duration of the COVID-19 declaration under Section 564(b)(1) of the Act, 21 U.S.C. section 360bbb-3(b)(1), unless the authorization is terminated or revoked.  Performed at  [facility], [place] [address] [place], [place] [address]   MRSA Next Gen by PCR, Nasal     Status: None   Collection Time: 02/22/23  3:33 PM   Specimen: Nasal Mucosa; Nasal Swab  Result Value Ref Range Status   MRSA by PCR Next Gen NOT DETECTED NOT DETECTED Final    Comment: (NOTE) The GeneXpert MRSA Assay (FDA approved for NASAL specimens only), is one component of a comprehensive MRSA colonization surveillance program. It is not intended to diagnose MRSA infection nor to guide or monitor treatment for MRSA infections. Test performance is not FDA approved in patients less than 91 years old. Performed at [facility], [place] [address] [place], [place] [address]      Radiology Studies: CT CHEST WO CONTRAST  Result Date: 02/22/2023 CLINICAL DATA:  62 year old female with history of hypoxia and dyspnea. EXAM: CT CHEST WITHOUT CONTRAST TECHNIQUE: Multidetector CT imaging of the chest was performed following the standard protocol without IV contrast. RADIATION DOSE REDUCTION: This exam was performed according to the departmental dose-optimization program which includes automated exposure control, adjustment of the mA and/or kV according to patient size and/or use of iterative reconstruction technique. COMPARISON:  Chest CTA 10/08/2022. FINDINGS: Cardiovascular: Heart size is normal. There is no significant pericardial fluid,  thickening or pericardial calcification. There is aortic atherosclerosis, as well as atherosclerosis of the great vessels of the mediastinum and the coronary arteries, including calcified atherosclerotic plaque in the right coronary artery. Mediastinum/Nodes: No pathologically enlarged mediastinal or hilar lymph nodes. Please note that accurate exclusion of hilar adenopathy is limited on noncontrast CT scans. Esophagus is unremarkable in appearance. No axillary lymphadenopathy. Lungs/Pleura: No acute consolidative airspace disease. No pleural effusions. No suspicious appearing pulmonary nodules or masses are noted. Diffuse bronchial wall thickening with mild to moderate centrilobular and paraseptal emphysema. Upper Abdomen: Atherosclerosis in the abdominal aorta. Visualized liver has a slightly nodular contour, suggesting underlying cirrhosis. Musculoskeletal: Orthopedic fixation hardware in the lower cervical spine incidentally noted. There are no aggressive appearing lytic or blastic lesions noted in the visualized portions of the skeleton. IMPRESSION: 1. No acute findings in the thorax. 2. Aortic atherosclerosis, in addition to right coronary artery disease. Please note that although the presence of coronary artery calcium documents the presence of coronary artery disease, the severity of this disease and any potential stenosis cannot be assessed on this non-gated CT examination. Assessment for potential risk factor modification, dietary therapy or pharmacologic therapy may be warranted, if clinically indicated. 3. Diffuse bronchial wall thickening with mild to moderate centrilobular and paraseptal emphysema; imaging findings suggestive of underlying COPD. 4. Morphologic changes in the liver suggesting underlying cirrhosis. Aortic Atherosclerosis (ICD10-I70.0) and Emphysema (ICD10-J43.9). Electronically Signed   By: Vinnie Langton M.D.   On: 02/22/2023 05:34   DG Chest Portable 1 View  Result Date:  02/21/2023 CLINICAL DATA:  Shortness of breath EXAM: PORTABLE CHEST 1 VIEW COMPARISON:  01/10/23 CXR FINDINGS: No pleural effusion. No pneumothorax. Normal cardiac and mediastinal contours. No focal airspace opacity. No radiographically apparent displaced rib fractures. Partially visualized cervical spinal fusion hardware in place. Visualized upper abdomen is unremarkable. IMPRESSION: No focal airspace opacity. Electronically Signed   By: Marin Roberts M.D.   On: 02/21/2023 21:59    Scheduled Meds:  albuterol  2.5 mg Nebulization Q6H   atorvastatin  40 mg Oral QHS   carbamazepine  400 mg Oral Daily   And   carbamazepine  600 mg Oral QHS  cholecalciferol  5,000 Units Oral Daily   dextromethorphan-guaiFENesin  1 tablet Oral BID   doxycycline  100 mg Oral Q12H   enoxaparin (LOVENOX) injection  40 mg Subcutaneous Q24H   famotidine  40 mg Oral QHS   ferrous sulfate  325 mg Oral Q breakfast   fluticasone furoate-vilanterol  1 puff Inhalation Daily   And   umeclidinium bromide  1 puff Inhalation Daily   gabapentin  100 mg Oral TID   hydrochlorothiazide  12.5 mg Oral Daily   insulin aspart  0-15 Units Subcutaneous TID WC   insulin aspart  0-5 Units Subcutaneous QHS   ipratropium-albuterol  3 mL Nebulization Once   irbesartan  75 mg Oral Daily   ketotifen  1 drop Both Eyes BID   montelukast  10 mg Oral QHS   pantoprazole  40 mg Oral BID   predniSONE  40 mg Oral Q breakfast   revefenacin  175 mcg Nebulization Daily   Continuous Infusions:   LOS: 1 day   Time spent: 55 minutes.  Bonnell Public, MD [facility]  02/23/2023, 3:01 PM   *Please note that this is a verbal dictation therefore any spelling or grammatical errors are due to the "[place] [employer]" system interpretation.  Please page via [place] and do not message via secure chat for urgent patient care matters. Secure chat can be used for non urgent patient care matters.  How to contact the [facility] Attending or Consulting  provider [place] or covering provider during after hours [place], for this patient?  Check the care team in [facility] and look for a) attending/consulting [facility] provider listed and b) the [facility] team listed. Page or secure chat [phone]. Log into [email or web address] and use [place]'s universal password to access. If you do not have the password, please contact the hospital operator. Locate the [facility] provider you are looking for under [facility] and page to a number that you can be directly reached. If you still have difficulty reaching the provider, please page the [facility] (Director on Call) for the Hospitalists listed on amion for assistance.

## 2023-02-23 NOTE — Plan of Care (Signed)
Discussed earlier with patient plan of care for the evening, pain management and medications with some teach back displayed.  Problem: Education: Goal: Ability to describe self-care measures that may prevent or decrease complications (Diabetes Survival Skills Education) will improve Outcome: Progressing   Problem: Health Behavior/Discharge Planning: Goal: Ability to identify and utilize available resources and services will improve Outcome: Progressing

## 2023-02-24 ENCOUNTER — Encounter: Payer: Self-pay | Admitting: Internal Medicine

## 2023-02-24 ENCOUNTER — Inpatient Hospital Stay (HOSPITAL_COMMUNITY): Payer: Medicare HMO

## 2023-02-24 DIAGNOSIS — I5031 Acute diastolic (congestive) heart failure: Secondary | ICD-10-CM | POA: Diagnosis not present

## 2023-02-24 DIAGNOSIS — J441 Chronic obstructive pulmonary disease with (acute) exacerbation: Secondary | ICD-10-CM | POA: Diagnosis not present

## 2023-02-24 LAB — HEPATIC FUNCTION PANEL
ALT: 22 U/L (ref 0–44)
AST: 22 U/L (ref 15–41)
Albumin: 3.3 g/dL — ABNORMAL LOW (ref 3.5–5.0)
Alkaline Phosphatase: 70 U/L (ref 38–126)
Bilirubin, Direct: <0.1 mg/dL (ref 0.0–0.2)
Total Bilirubin: 0.4 mg/dL (ref 0.3–1.2)
Total Protein: 6.3 g/dL — ABNORMAL LOW (ref 6.5–8.1)

## 2023-02-24 LAB — GLUCOSE, CAPILLARY
Glucose-Capillary: 122 mg/dL — ABNORMAL HIGH (ref 70–99)
Glucose-Capillary: 182 mg/dL — ABNORMAL HIGH (ref 70–99)
Glucose-Capillary: 130 mg/dL — ABNORMAL HIGH (ref 70–99)
Glucose-Capillary: 193 mg/dL — ABNORMAL HIGH (ref 70–99)

## 2023-02-24 LAB — ECHOCARDIOGRAM COMPLETE
AR max vel: 1.72 cm2
AV Area VTI: 1.71 cm2
AV Area mean vel: 1.67 cm2
AV Mean grad: 5 mmHg
AV Peak grad: 9.8 mmHg
Ao pk vel: 1.57 m/s
Area-P 1/2: 4.17 cm2
Height: 63 in
S' Lateral: 3.1 cm
Weight: 3107.6 oz

## 2023-02-24 LAB — HEPATITIS PANEL, ACUTE
HCV Ab: NONREACTIVE
Hep A IgM: NONREACTIVE
Hep B C IgM: NONREACTIVE
Hepatitis B Surface Ag: NONREACTIVE

## 2023-02-24 NOTE — Progress Notes (Signed)
  Echocardiogram 2D Echocardiogram has been performed.  [name] 02/24/2023, 1:45 PM

## 2023-02-24 NOTE — Progress Notes (Signed)
PROGRESS NOTE    [name]  [id number] DOB: February 07, 1961 DOA: 02/21/2023 PCP: Biagio Borg, MD   Brief Narrative:  HPI: This is a 63 year old female with past medical history of COPD, hypertension, bipolar, fibromyalgia.  Patient recently admitted in January with influenza, COPD exacerbation and respiratory failure.  Per patient since discharge she has not been well.  She has been back-and-forth between her pulmonologist and her PCP without improvement.  Per patient she remains quite dyspneic and short of breath.  Walking from 1 room to the other side she is very short of breath.  Her shortness of breath was significant prior however she managed, not anymore.  She endorses a chronic wheeze which is getting progressively worse.  She states anything will trigger her wheezing or shortness of breath.  She cites fumes, colognes, vehicle fumes, and history, perfume, smoke.  She states she is not even able to shop for groceries because of the effects of different scents.  She is not able to cook.  She states when she becomes a short of breath she has become incontinent.  This occurred the last time she went grocery shopping.  She denies fevers or chills.  She endorses a headache which she states started in the ER after an employee came in with a perfume on.  She denies fevers or chills.  She was brought to the ER because of her worsening shortness of breath.   In the ambulance, patient given steroids and nebs.  Her lungs were quite without wheeze in the ER but patient with shortness of breath.  She had increased work of breathing.  She was placed on the BiPAP, patient appeared more comfortable, her work of breathing decreased.  Admission requested  02/23/2023: Patient continues to report dyspnea on minimal exertion.  Wheezing seems to be improving.  Previous CT chest revealed findings suggestive of COPD, as well as, aortic atherosclerosis, atherosclerosis of RCA and abdominal vessels.   Findings suggestive of liver cirrhosis also noted.  Last echocardiogram was in 2012.  Will proceed with echocardiogram, cardiac BNP and troponins.  Have a low threshold to consult cardiology team.  Continue management for COPD with exacerbation.  02/24/2023: Patient seen alongside patient's daughter.  Patient continues to improve but still wheezing.  Troponins came back negative.  Patient is currently undergoing echocardiogram.  Will continue treatment for COPD with exacerbation.  Likely discharge in the next 24 to 48 hours.  Patient will follow-up with PCP, pulmonary and cardiology team on discharge.  Cardiology to determine need for cardiac stress test (on outpatient basis).    Assessment & Plan:   Active Problems:   Fibromyalgia   Bipolar I disorder (HCC)   Seizure disorder (HCC)   History of stroke   Essential (primary) hypertension   Chronic respiratory failure with hypoxia and hypercapnia (HCC)   Acute exacerbation of COPD with asthma ([facility])  Acute COPD exacerbation-acute asthma overlap syndrome: Patient typically uses BiPAP at night.  She says that she is still feeling short of breath, no better than yesterday.  She feels short of breath by going from 1 room to another.  Interestingly, she is saturating 97% on room air but continues to feel short of breath.  On exam, has very diminished breath sounds but no wheezes.  She does not feel comfortable going home.  We will continue prednisone and bronchodilators and antibiotics.  Reassess in the morning. 02/23/2023: Will review medications.  Patient will be on steroids IV, nebs Pulmicort, nebs DuoNeb.  Will continue montelukast.  Peak flow daily. 02/24/2023: See above documentation.  Essential hypertension: Controlled.  Continue current regimen.  Hyperlipidemia: Atorvastatin.  GERD: Continue Protonix and Pepcid.  Bipolar disorder: Continue Tegretol.  Level is low.  Will defer to her physician as outpatient.  Chronic headache: Resume  Fioricet.  Dyspnea on minimal exertion: -Cardiac BNP was 21. -Troponins came back negative. -Echocardiogram result is pending.   -CT chest finding noted.  Patient has atherosclerotic heart disease.  No pressure to consult cardiology team. -Consult physical therapy. -Follow-up with cardiology team on discharge.    Obesity: -Diet and exercise.  Liver cirrhosis: -Check LFTs in the morning. -Acute hepatitis panel in the morning. -Suspect secondary to NASH.  DVT prophylaxis:  Subcutaneous Lovenox.    Code Status: Full Code   Family Communication:    Status is: Inpatient Remains inpatient appropriate because: Patient is still symptomatic.   Estimated body mass index is 34.41 kg/m as calculated from the following:   Height as of this encounter: '5\' 3"'$  (1.6 m).   Weight as of this encounter: 88.1 kg.    Nutritional Assessment: Body mass index is 34.41 kg/m.Marland Kitchen Seen by dietician.  I agree with the assessment and plan as outlined below: Nutrition Status:        . Skin Assessment: I have examined the patient's skin and I agree with the wound assessment as performed by the wound care RN as outlined below:    Consultants:  None  Procedures:  None  Antimicrobials:  Anti-infectives (From admission, onward)    Start     Dose/Rate Route Frequency Ordered Stop   02/22/23 2200  doxycycline (VIBRA-TABS) tablet 100 mg       See Hyperspace for full Linked Orders Report.   100 mg Oral Every 12 hours 02/22/23 0644     02/22/23 0645  doxycycline (VIBRA-TABS) tablet 200 mg       See Hyperspace for full Linked Orders Report.   200 mg Oral  Once 02/22/23 0643 02/22/23 0702   02/21/23 2215  azithromycin (ZITHROMAX) 500 mg in sodium chloride 0.9 % 250 mL IVPB        500 mg 250 mL/hr over 60 Minutes Intravenous  Once 02/21/23 2213 02/22/23 0017         Subjective: Patient seen and examined.  She still complains of shortness of breath, no better than  yesterday.  Objective: Vitals:   02/24/23 0500 02/24/23 0754 02/24/23 0800 02/24/23 1110  BP:  126/72 126/72 127/76  Pulse: 63 73 93 79  Resp: '19 11 18 15  '$ Temp:  98.1 F (36.7 C)  98.3 F (36.8 C)  TempSrc:  Oral  Oral  SpO2: 92% 93% 92% 93%  Weight:      Height:        Intake/Output Summary (Last 24 hours) at 02/24/2023 1526 Last data filed at 02/24/2023 1010 Gross per 24 hour  Intake 240 ml  Output 1750 ml  Net -1510 ml    Filed Weights   02/21/23 2058 02/22/23 1531  Weight: 87.5 kg 88.1 kg    Examination:  General exam: Appears calm and comfortable  Respiratory system: Diminished breath sounds, no wheezes or rhonchi.  Respiratory effort normal. Cardiovascular system: S1 & S2 heard, RRR. No JVD, murmurs, rubs, gallops or clicks. No pedal edema. Gastrointestinal system: Abdomen is nondistended, soft and nontender. No organomegaly or masses felt. Normal bowel sounds heard. Central nervous system: Alert and oriented. No focal neurological deficits. Extremities: Symmetric 5 x 5  power. Skin: No rashes, lesions or ulcers Psychiatry: Judgement and insight appear normal. Mood & affect appropriate.    Data Reviewed: I have personally reviewed following labs and imaging studies  CBC: Recent Labs  Lab 02/21/23 2100 02/21/23 2126 02/22/23 0116 02/22/23 0525  WBC 12.1*  --  10.7* 6.2  NEUTROABS 7.5  --  9.9* 2.1  HGB 10.3* 9.9* 10.2* 12.6  HCT 31.6* 29.0* 31.2* 38.6  MCV 96.9  --  95.7 93.5  PLT 329  --  310 [id number]    Basic Metabolic Panel: Recent Labs  Lab 02/21/23 2100 02/21/23 2126 02/22/23 0116 02/22/23 0525  NA 141 142 141 139  K 3.6 3.4* 4.0 3.9  CL 105  --  107 107  CO2 27  --  25 22  GLUCOSE 171*  --  166* 181*  BUN 7  --  8 14  CREATININE 0.68  --  0.64 0.86  CALCIUM 8.8*  --  8.7* 9.3  MG 3.0*  --  2.3  --     GFR: Estimated Creatinine Clearance: 73.2 mL/min (by C-G formula based on SCr of 0.86 mg/dL). Liver Function Tests: Recent Labs  Lab  02/21/23 2100 02/24/23 0012  AST 24 22  ALT 21 22  ALKPHOS 80 70  BILITOT 0.3 0.4  PROT 5.7* 6.3*  ALBUMIN 3.3* 3.3*    No results for input(s): "LIPASE", "AMYLASE" in the last 168 hours. No results for input(s): "AMMONIA" in the last 168 hours. Coagulation Profile: No results for input(s): "INR", "PROTIME" in the last 168 hours. Cardiac Enzymes: No results for input(s): "CKTOTAL", "CKMB", "CKMBINDEX", "TROPONINI" in the last 168 hours. BNP (last 3 results) No results for input(s): "PROBNP" in the last 8760 hours. HbA1C: No results for input(s): "HGBA1C" in the last 72 hours. CBG: Recent Labs  Lab 02/23/23 1100 02/23/23 1624 02/23/23 2054 02/24/23 0616 02/24/23 1112  GLUCAP 114* 138* 194* 122* 182*    Lipid Profile: No results for input(s): "CHOL", "HDL", "LDLCALC", "TRIG", "CHOLHDL", "LDLDIRECT" in the last 72 hours. Thyroid Function Tests: No results for input(s): "TSH", "T4TOTAL", "FREET4", "T3FREE", "THYROIDAB" in the last 72 hours. Anemia Panel: No results for input(s): "VITAMINB12", "FOLATE", "FERRITIN", "TIBC", "IRON", "RETICCTPCT" in the last 72 hours. Sepsis Labs: No results for input(s): "PROCALCITON", "LATICACIDVEN" in the last 168 hours.  Recent Results (from the past 240 hour(s))  Resp panel by RT-PCR (RSV, Flu A&B, Covid) Anterior Nasal Swab     Status: None   Collection Time: 02/21/23 11:19 PM   Specimen: Anterior Nasal Swab  Result Value Ref Range Status   SARS Coronavirus 2 by RT PCR NEGATIVE NEGATIVE Final   Influenza A by PCR NEGATIVE NEGATIVE Final   Influenza B by PCR NEGATIVE NEGATIVE Final    Comment: (NOTE) The Xpert Xpress SARS-CoV-2/FLU/RSV plus assay is intended as an aid in the diagnosis of influenza from Nasopharyngeal swab specimens and should not be used as a sole basis for treatment. Nasal washings and aspirates are unacceptable for Xpert Xpress SARS-CoV-2/FLU/RSV testing.  Fact Sheet for  Patients: EntrepreneurPulse.com.au  Fact Sheet for Healthcare Providers: IncredibleEmployment.be  This test is not yet approved or cleared by the [place] FDA and has been authorized for detection and/or diagnosis of SARS-CoV-2 by FDA under an Emergency Use Authorization (EUA). This EUA will remain in effect (meaning this test can be used) for the duration of the COVID-19 declaration under Section 564(b)(1) of the Act, 21 U.S.C. section 360bbb-3(b)(1), unless the authorization is terminated or revoked.  Resp Syncytial Virus by PCR NEGATIVE NEGATIVE Final    Comment: (NOTE) Fact Sheet for Patients: EntrepreneurPulse.com.au  Fact Sheet for Healthcare Providers: IncredibleEmployment.be  This test is not yet approved or cleared by the [place] FDA and has been authorized for detection and/or diagnosis of SARS-CoV-2 by FDA under an Emergency Use Authorization (EUA). This EUA will remain in effect (meaning this test can be used) for the duration of the COVID-19 declaration under Section 564(b)(1) of the Act, 21 U.S.C. section 360bbb-3(b)(1), unless the authorization is terminated or revoked.  Performed at [facility], [place] [address] [place], [place] [address]   MRSA Next Gen by PCR, Nasal     Status: None   Collection Time: 02/22/23  3:33 PM   Specimen: Nasal Mucosa; Nasal Swab  Result Value Ref Range Status   MRSA by PCR Next Gen NOT DETECTED NOT DETECTED Final    Comment: (NOTE) The GeneXpert MRSA Assay (FDA approved for NASAL specimens only), is one component of a comprehensive MRSA colonization surveillance program. It is not intended to diagnose MRSA infection nor to guide or monitor treatment for MRSA infections. Test performance is not FDA approved in patients less than 35 years old. Performed at [facility], [place] [address] [place], [place] [address]      Radiology  Studies: ECHOCARDIOGRAM COMPLETE  Result Date: 02/24/2023    ECHOCARDIOGRAM REPORT   Patient Name:   [name] Date of Exam: 02/24/2023 Medical Rec #:  [id number]            Height:       63.0 in Accession #:    [id number]           Weight:       194.2 lb Date of Birth:  08-30-1961             BSA:          1.910 m Patient Age:    31 years             BP:           127/76 mmHg Patient Gender: F                    HR:           84 bpm. Exam Location:  Inpatient Procedure: 2D Echo, Cardiac Doppler and Color Doppler Indications:    CHF-Acute Diastolic [id number]  History:        Patient has prior history of Echocardiogram examinations, most                 recent 06/10/2020. Stroke, Signs/Symptoms:Shortness of Breath;                 Risk Factors:Former Smoker, Hypertension and Dyslipidemia.  Sonographer:    Greer Pickerel Referring Phys: [id number] Alize Acy I Brandi Tomlinson  Sonographer Comments: Suboptimal parasternal window. Image acquisition challenging due to patient body habitus and Image acquisition challenging due to respiratory motion. IMPRESSIONS  1. Left ventricular ejection fraction, by estimation, is 60 to 65%. The left ventricle has normal function. The left ventricle has no regional wall motion abnormalities. Left ventricular diastolic parameters were normal.  2. Right ventricular systolic function is normal. The right ventricular size is normal. There is normal pulmonary artery systolic pressure.  3. The mitral valve is normal in structure. No evidence of mitral valve regurgitation. No evidence of mitral stenosis.  4. The aortic valve has an indeterminant number of  cusps. There is mild calcification of the aortic valve. There is mild thickening of the aortic valve. Aortic valve regurgitation is not visualized. No aortic stenosis is present.  5. The inferior vena cava is normal in size with greater than 50% respiratory variability, suggesting right atrial pressure of 3 mmHg. FINDINGS  Left Ventricle: Left  ventricular ejection fraction, by estimation, is 60 to 65%. The left ventricle has normal function. The left ventricle has no regional wall motion abnormalities. The left ventricular internal cavity size was normal in size. There is  no left ventricular hypertrophy. Left ventricular diastolic parameters were normal. Right Ventricle: The right ventricular size is normal. Right vetricular wall thickness was not well visualized. Right ventricular systolic function is normal. There is normal pulmonary artery systolic pressure. The tricuspid regurgitant velocity is 1.15 m/s, and with an assumed right atrial pressure of 3 mmHg, the estimated right ventricular systolic pressure is 8.3 mmHg. Left Atrium: Left atrial size was normal in size. Right Atrium: Right atrial size was normal in size. Pericardium: There is no evidence of pericardial effusion. Mitral Valve: The mitral valve is normal in structure. No evidence of mitral valve regurgitation. No evidence of mitral valve stenosis. Tricuspid Valve: The tricuspid valve is normal in structure. Tricuspid valve regurgitation is not demonstrated. No evidence of tricuspid stenosis. Aortic Valve: The aortic valve has an indeterminant number of cusps. There is mild calcification of the aortic valve. There is mild thickening of the aortic valve. There is mild aortic valve annular calcification. Aortic valve regurgitation is not visualized. No aortic stenosis is present. Aortic valve mean gradient measures 5.0 mmHg. Aortic valve peak gradient measures 9.8 mmHg. Aortic valve area, by VTI measures 1.71 cm. Pulmonic Valve: The pulmonic valve was not well visualized. Pulmonic valve regurgitation is not visualized. No evidence of pulmonic stenosis. Aorta: The aortic root and ascending aorta are structurally normal, with no evidence of dilitation. Venous: The inferior vena cava is normal in size with greater than 50% respiratory variability, suggesting right atrial pressure of 3 mmHg.  IAS/Shunts: No atrial level shunt detected by color flow Doppler.  LEFT VENTRICLE PLAX 2D LVIDd:         4.60 cm   Diastology LVIDs:         3.10 cm   LV e' medial:    7.72 cm/s LV PW:         1.00 cm   LV E/e' medial:  11.9 LV IVS:        0.90 cm   LV e' lateral:   11.10 cm/s LVOT diam:     1.90 cm   LV E/e' lateral: 8.3 LV SV:         49 LV SV Index:   26 LVOT Area:     2.84 cm  RIGHT VENTRICLE RV S prime:     11.90 cm/s TAPSE (M-mode): 2.1 cm LEFT ATRIUM             Index        RIGHT ATRIUM           Index LA diam:        3.40 cm 1.78 cm/m   RA Area:     11.50 cm LA Vol (A2C):   50.7 ml 26.55 ml/m  RA Volume:   27.60 ml  14.45 ml/m LA Vol (A4C):   43.2 ml 22.62 ml/m LA Biplane Vol: 47.1 ml 24.66 ml/m  AORTIC VALVE AV Area (Vmax):    1.72 cm AV  Area (Vmean):   1.67 cm AV Area (VTI):     1.71 cm AV Vmax:           156.85 cm/s AV Vmean:          106.875 cm/s AV VTI:            0.288 m AV Peak Grad:      9.8 mmHg AV Mean Grad:      5.0 mmHg LVOT Vmax:         95.40 cm/s LVOT Vmean:        62.900 cm/s LVOT VTI:          0.174 m LVOT/AV VTI ratio: 0.60  AORTA Ao Root diam: 3.60 cm Ao Asc diam:  3.40 cm MITRAL VALVE               TRICUSPID VALVE MV Area (PHT): 4.17 cm    TR Peak grad:   5.3 mmHg MV Decel Time: 182 msec    TR Vmax:        115.00 cm/s MV E velocity: 92.20 cm/s MV A velocity: 82.30 cm/s  SHUNTS MV E/A ratio:  1.12        Systemic VTI:  0.17 m                            Systemic Diam: 1.90 cm Carlyle Dolly MD Electronically signed by Carlyle Dolly MD Signature Date/Time: 02/24/2023/1:51:12 PM    Final     Scheduled Meds:  atorvastatin  40 mg Oral QHS   budesonide (PULMICORT) nebulizer solution  0.5 mg Nebulization BID   carbamazepine  400 mg Oral Daily   And   carbamazepine  600 mg Oral QHS   cholecalciferol  5,000 Units Oral Daily   dextromethorphan-guaiFENesin  1 tablet Oral BID   doxycycline  100 mg Oral Q12H   enoxaparin (LOVENOX) injection  40 mg Subcutaneous Q24H    famotidine  40 mg Oral QHS   ferrous sulfate  325 mg Oral Q breakfast   gabapentin  100 mg Oral TID   hydrochlorothiazide  12.5 mg Oral Daily   insulin aspart  0-15 Units Subcutaneous TID WC   insulin aspart  0-5 Units Subcutaneous QHS   ipratropium-albuterol  3 mL Nebulization Q6H   irbesartan  75 mg Oral Daily   ketotifen  1 drop Both Eyes BID   methylPREDNISolone (SOLU-MEDROL) injection  40 mg Intravenous Q12H   montelukast  10 mg Oral QHS   Continuous Infusions:   LOS: 2 days   Time spent: 55 minutes.  Bonnell Public, MD [facility]  02/24/2023, 3:26 PM   *Please note that this is a verbal dictation therefore any spelling or grammatical errors are due to the "[employer]" system interpretation.  Please page via [place] and do not message via secure chat for urgent patient care matters. Secure chat can be used for non urgent patient care matters.  How to contact the [facility] Attending or Consulting provider [place] or covering provider during after hours [place], for this patient?  Check the care team in [facility] and look for a) attending/consulting [facility] provider listed and b) the [facility] team listed. Page or secure chat [phone]. Log into [email or web address] and use [place]'s universal password to access. If you do not have the password, please contact the hospital operator. Locate the [facility] provider you are looking for under [facility] and page to a  number that you can be directly reached. If you still have difficulty reaching the provider, please page the [facility] (Director on Call) for the Hospitalists listed on amion for assistance.

## 2023-02-25 ENCOUNTER — Inpatient Hospital Stay: Payer: Medicare HMO | Admitting: Internal Medicine

## 2023-02-25 DIAGNOSIS — J45901 Unspecified asthma with (acute) exacerbation: Secondary | ICD-10-CM | POA: Diagnosis not present

## 2023-02-25 DIAGNOSIS — R0603 Acute respiratory distress: Secondary | ICD-10-CM | POA: Diagnosis not present

## 2023-02-25 DIAGNOSIS — J441 Chronic obstructive pulmonary disease with (acute) exacerbation: Secondary | ICD-10-CM | POA: Diagnosis not present

## 2023-02-25 DIAGNOSIS — J9611 Chronic respiratory failure with hypoxia: Secondary | ICD-10-CM | POA: Diagnosis not present

## 2023-02-25 DIAGNOSIS — J9612 Chronic respiratory failure with hypercapnia: Secondary | ICD-10-CM | POA: Diagnosis not present

## 2023-02-25 LAB — GLUCOSE, CAPILLARY
Glucose-Capillary: 124 mg/dL — ABNORMAL HIGH (ref 70–99)
Glucose-Capillary: 155 mg/dL — ABNORMAL HIGH (ref 70–99)
Glucose-Capillary: 211 mg/dL — ABNORMAL HIGH (ref 70–99)
Glucose-Capillary: 90 mg/dL (ref 70–99)

## 2023-02-25 IMAGING — CR DG CHEST 2V
2 series · 2 of 2 positions shown · non-contrast
Comparison: 07/17/2021

CLINICAL DATA: Chest pain.

EXAM:
CHEST - 2 VIEW

[chest pa]
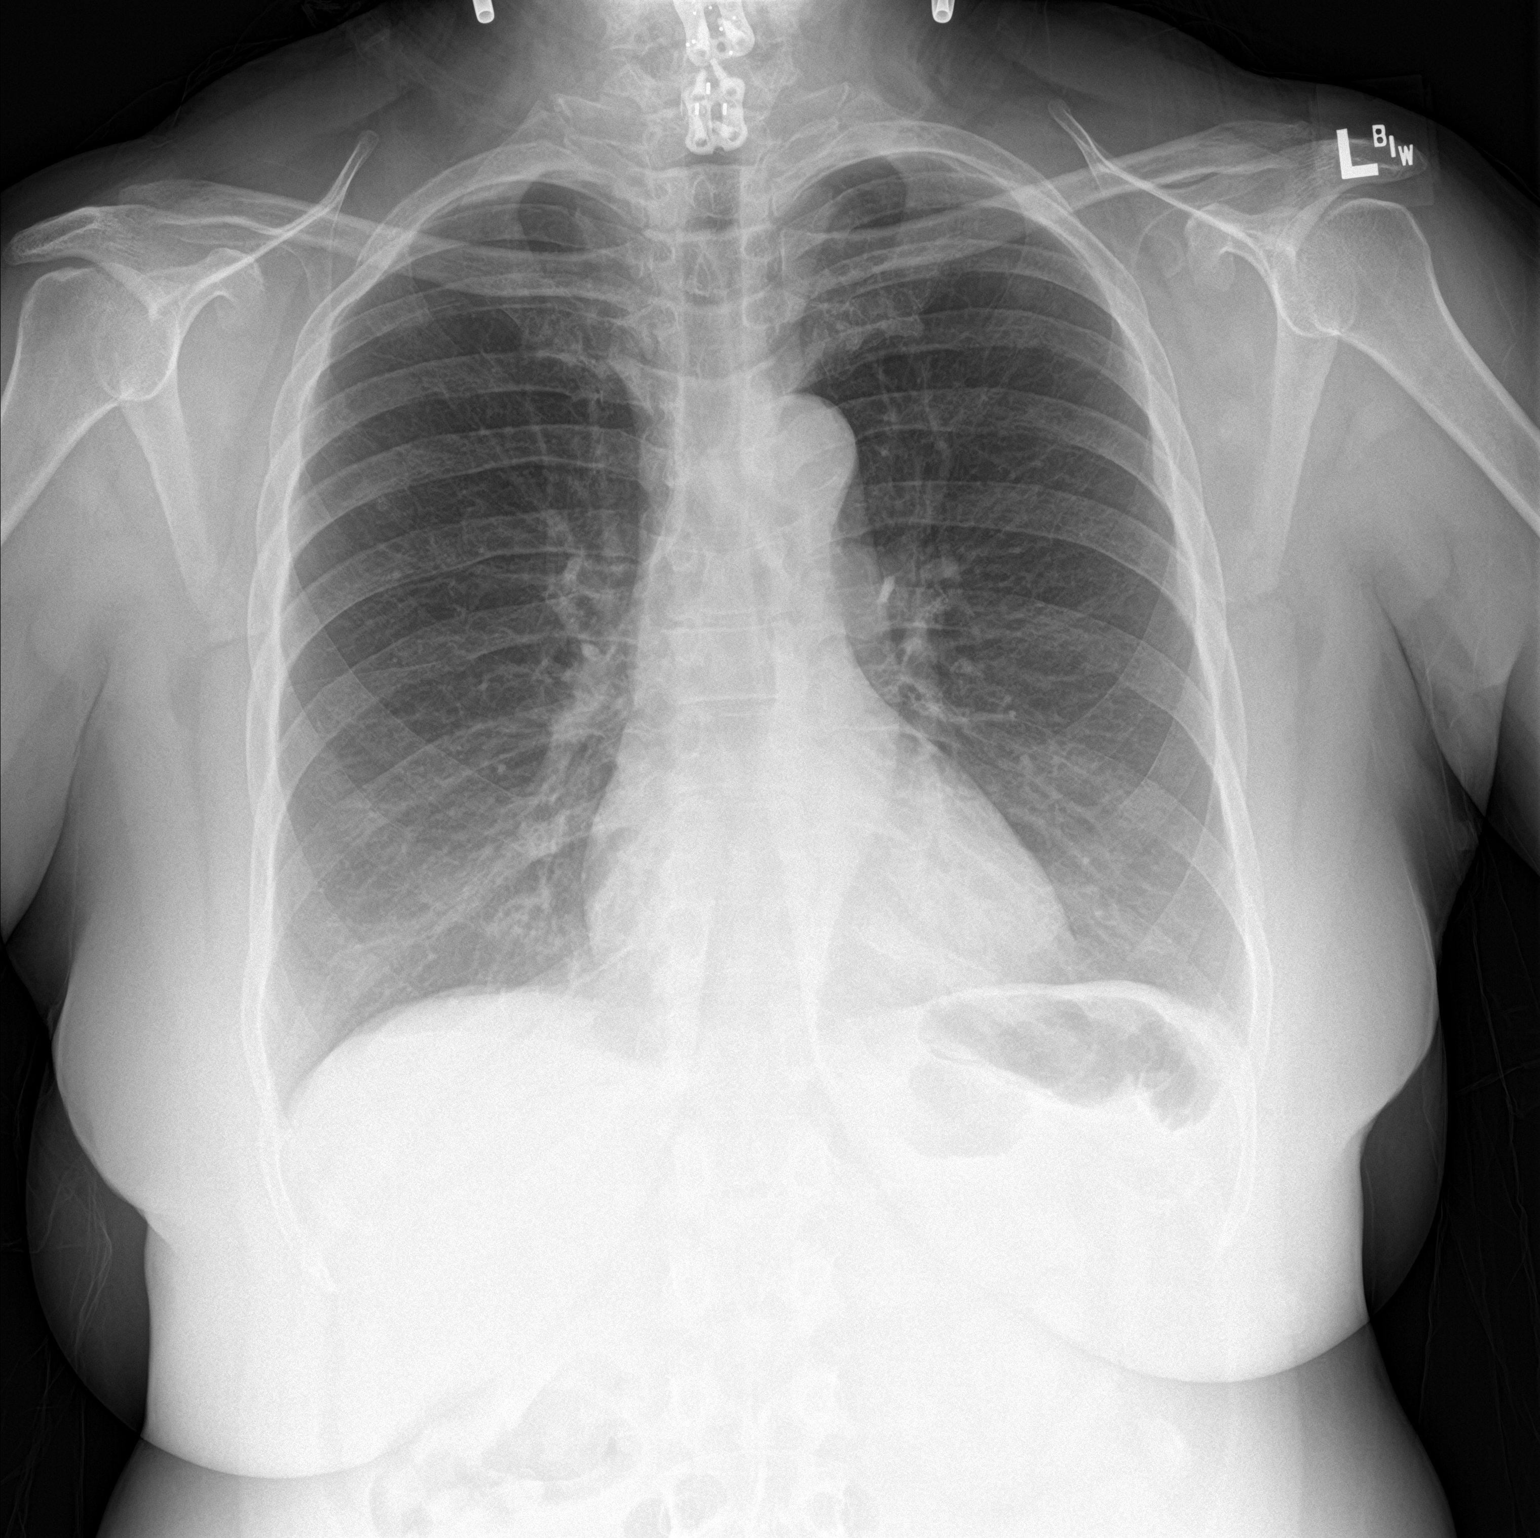

[chest lat]
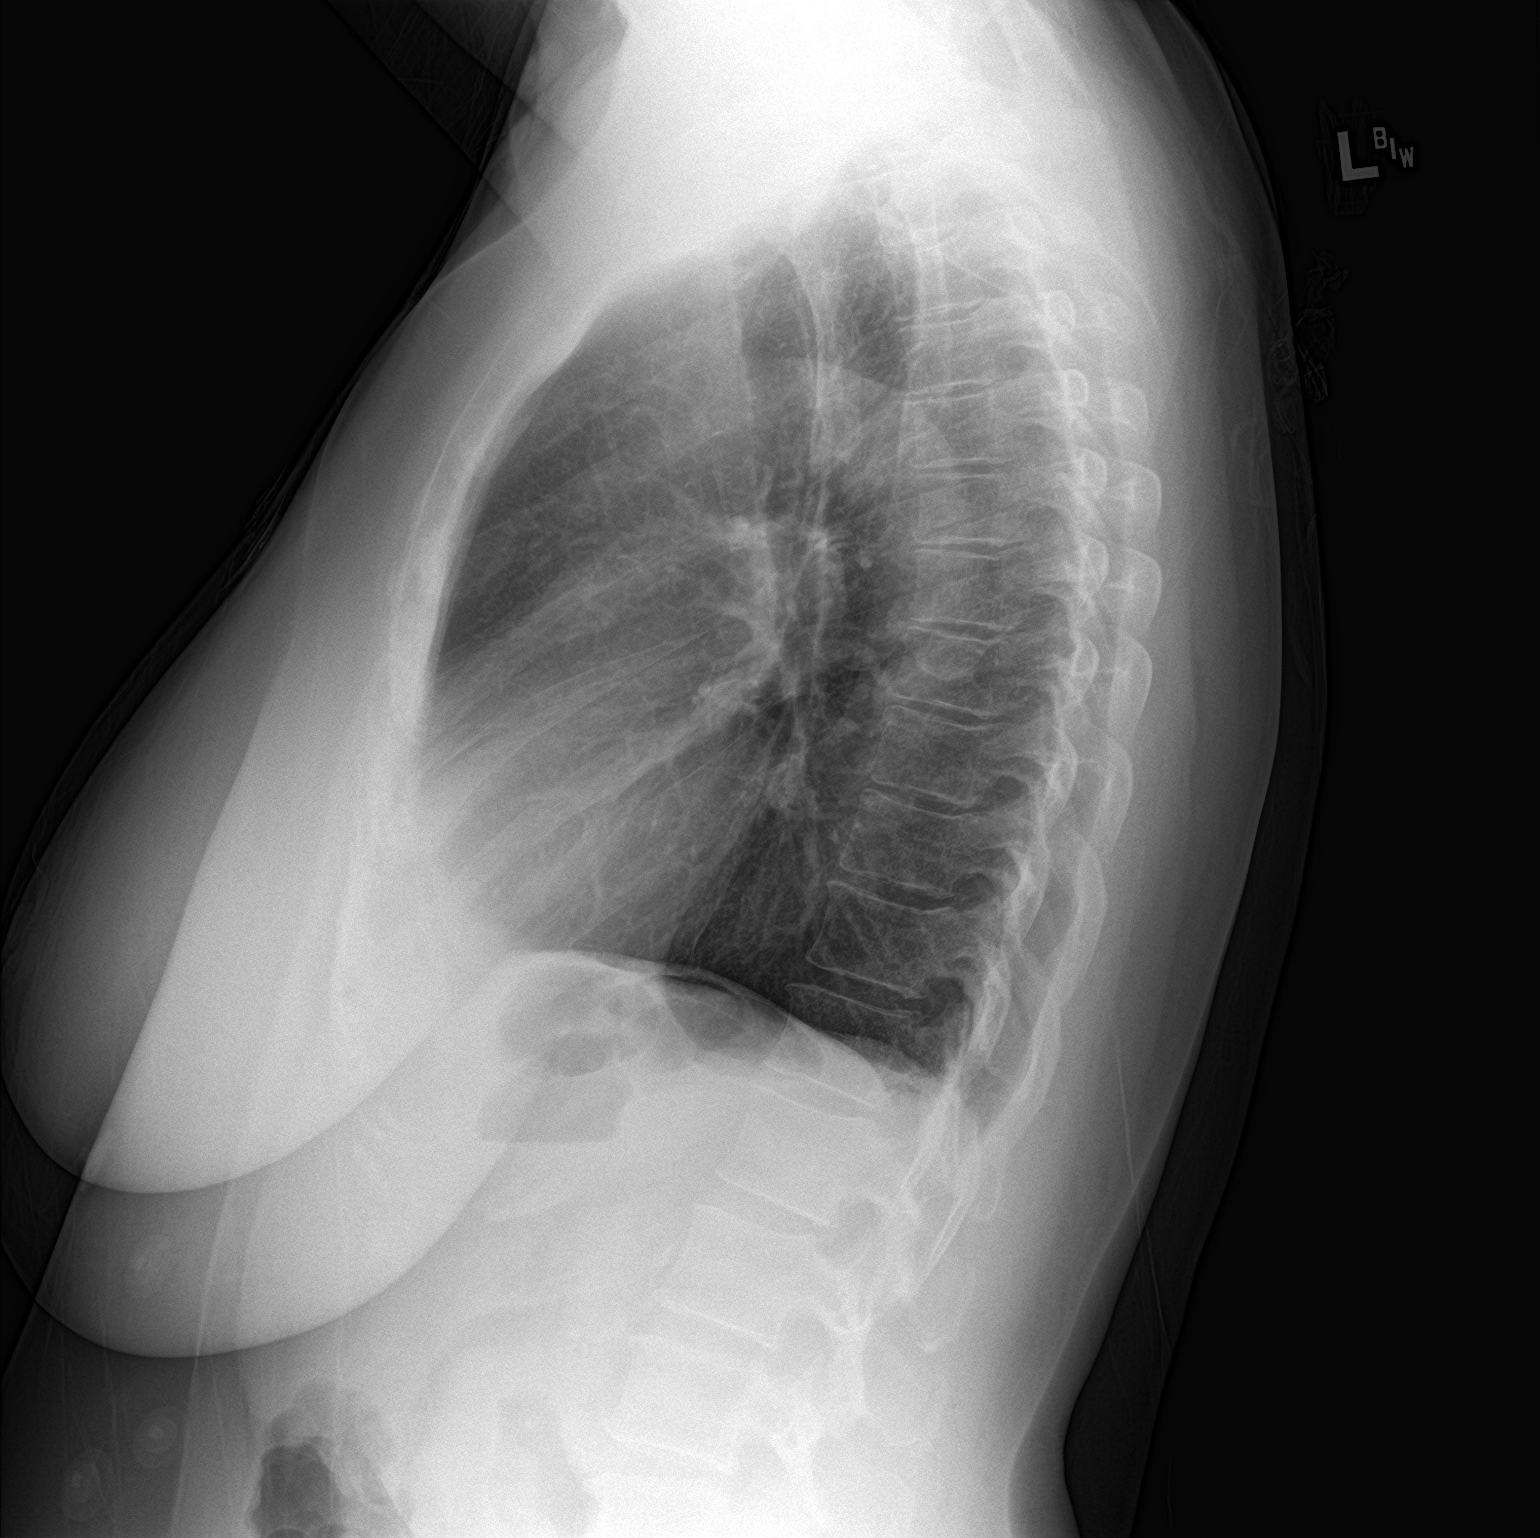

[2 of 2 positions shown; findings below may reference images not displayed]

FINDINGS: The cardiac silhouette, mediastinal and hilar contours are normal
and stable. Stable mild tortuosity and calcification of the thoracic
aorta.

The lungs are clear of an acute process. No pulmonary lesions or
pleural effusions. The bony thorax is intact.
IMPRESSION: No acute cardiopulmonary findings.

## 2023-02-25 MED ORDER — METOPROLOL TARTRATE 5 MG/5ML IV SOLN: 5.0000 mg | INTRAVENOUS | Status: DC | PRN

## 2023-02-25 MED ORDER — TRAZODONE HCL 50 MG PO TABS
50.0000 mg | ORAL_TABLET | Freq: Every evening | ORAL | Status: DC | PRN
  Administered 2023-02-26: 50 mg via ORAL
  Filled 2023-02-25: qty 1

## 2023-02-25 MED ORDER — HYDRALAZINE HCL 20 MG/ML IJ SOLN: 10.0000 mg | INTRAMUSCULAR | Status: DC | PRN

## 2023-02-25 NOTE — Telephone Encounter (Signed)
Thank you :)

## 2023-02-25 NOTE — Consult Note (Signed)
NAME:  [name], MRN:  [id number], DOB:  04-25-1961, LOS: 3 ADMISSION DATE:  02/21/2023, CONSULTATION DATE:  02/25/2023 REFERRING MD:  Dr. Reesa Chew - [facility], CHIEF COMPLAINT:  Wheezing    History of Present Illness:  [name] is a 62 y.o. female with a PMH significant for COPD, asthma, HTN. HLD, PVD, prior stroke, anxiety, and anemia who presented to the ED 02/21/23 for complaints of shortness of breath and weakness that began 2 days prior to admission. She required application of BIPAP on ED arrival. Workup on admission conisstent with acute exacerbation of COPD, she was admitted per [facility] and treated with supplemental oxygen, steroids, and bronchodilators.   Mid morning of of 3/11 patient continues to display waxing and waning respiratory status with intermittent wheezing assessed prompting PCCM consult.    Pertinent  Medical History  COPD, asthma, HTN. HLD, PVD, prior stroke, anxiety, and anemia  Significant Hospital Events: Including procedures, antibiotic start and stop dates in addition to other pertinent events   3/7 presented to ED for complaints of shortness of breath and weakness, admitted for acute exacerbation of COPD  3/11 PCCM consulted for ongoing intermittent wheezing   Interim History / Subjective:  As above   Objective   Blood pressure (!) 152/95, pulse 94, temperature 98.1 F (36.7 C), temperature source Oral, resp. rate 18, height '5\' 3"'$  (1.6 m), weight 88.1 kg, SpO2 98 %.    FiO2 (%):  [21 %] 21 %   Intake/Output Summary (Last 24 hours) at 02/25/2023 1235 Last data filed at 02/25/2023 1224 Gross per 24 hour  Intake 480 ml  Output 1700 ml  Net -1220 ml   Filed Weights   02/21/23 2058 02/22/23 1531  Weight: 87.5 kg 88.1 kg    Examination: General: Well appearing middle aged female sitting up in bedside recline in NAD HEENT: [place]/AT, MM pink/moist, PERRL,  Neuro: Alert and oriented x3 CV: s1s2 regular rate and rhythm, no murmur, rubs, or  gallops,  PULM:  Clear to auscultation, no increased work of breathing, on RA GI: soft, bowel sounds active in all 4 quadrants, non-tender, non-distended, tolerating oral diet Extremities: warm/dry, no edema  Skin: no rashes or lesions  Resolved Hospital Problem list     Assessment & Plan:  Acute COPD exacerbation with history of COPD with asthma overlap > POA -Severe obstruction with a positive bronchodilator response confirmed on pulmonary function test Allergic rhinitis  -At baseline utilizes fluticasone nasal spray, Xyzal, Singulair, Dupixent  Laryngopharyngeal reflux  -On Pepcid and Protonix at baseline but continues to reports significant reflux  Chronic respiratory failure with hypoxia and hypercapnia  -Uses BIPAP at HS at baseline  Questionable component of upper airway irritation syndrome/vocal cord dysfunction -Wheezing waxes and wanes, appears to be exacerbated with physical activity  P: BIPAP QHS Continue Pulmicort and Duonebs  Continue Flonase, Xyzal, Singulair, and Dupixent upon discharge Outpatient referral to speech therapy placed  Will likely need ENT assessment of vocal cords in the outpatient setting  Follow up appointment with Dr. Lamonte Sakai scheduled 3/19   Best Practice (right click and "Reselect all SmartList Selections" daily)  Per primary   Labs   CBC: Recent Labs  Lab 02/21/23 2100 02/21/23 2126 02/22/23 0116 02/22/23 0525  WBC 12.1*  --  10.7* 6.2  NEUTROABS 7.5  --  9.9* 2.1  HGB 10.3* 9.9* 10.2* 12.6  HCT 31.6* 29.0* 31.2* 38.6  MCV 96.9  --  95.7 93.5  PLT 329  --  310  [id number]    Basic Metabolic Panel: Recent Labs  Lab 02/21/23 2100 02/21/23 2126 02/22/23 0116 02/22/23 0525  NA 141 142 141 139  K 3.6 3.4* 4.0 3.9  CL 105  --  107 107  CO2 27  --  25 22  GLUCOSE 171*  --  166* 181*  BUN 7  --  8 14  CREATININE 0.68  --  0.64 0.86  CALCIUM 8.8*  --  8.7* 9.3  MG 3.0*  --  2.3  --    GFR: Estimated Creatinine Clearance: 73.2 mL/min  (by C-G formula based on SCr of 0.86 mg/dL). Recent Labs  Lab 02/21/23 2100 02/22/23 0116 02/22/23 0525  WBC 12.1* 10.7* 6.2    Liver Function Tests: Recent Labs  Lab 02/21/23 2100 02/24/23 0012  AST 24 22  ALT 21 22  ALKPHOS 80 70  BILITOT 0.3 0.4  PROT 5.7* 6.3*  ALBUMIN 3.3* 3.3*   No results for input(s): "LIPASE", "AMYLASE" in the last 168 hours. No results for input(s): "AMMONIA" in the last 168 hours.  ABG    Component Value Date/Time   PHART 7.359 06/13/2022 1044   PCO2ART 57.7 (H) 06/13/2022 1044   PO2ART 253 (H) 06/13/2022 1044   HCO3 28.1 (H) 02/21/2023 2126   TCO2 29 02/21/2023 2126   O2SAT 65 02/21/2023 2126     Coagulation Profile: No results for input(s): "INR", "PROTIME" in the last 168 hours.  Cardiac Enzymes: No results for input(s): "CKTOTAL", "CKMB", "CKMBINDEX", "TROPONINI" in the last 168 hours.  HbA1C: Hgb A1c MFr Bld  Date/Time Value Ref Range Status  09/13/2022 03:29 PM 6.5 4.6 - 6.5 % Final    Comment:    Glycemic Control Guidelines for People with Diabetes:Non Diabetic:  <6%Goal of Therapy: <7%Additional Action Suggested:  >8%   05/18/2022 04:03 PM 6.3 4.6 - 6.5 % Final    Comment:    Glycemic Control Guidelines for People with Diabetes:Non Diabetic:  <6%Goal of Therapy: <7%Additional Action Suggested:  >8%     CBG: Recent Labs  Lab 02/24/23 1112 02/24/23 1535 02/24/23 2115 02/25/23 0632 02/25/23 1133  GLUCAP 182* 130* 193* 90 155*    Review of Systems:   Please see the history of present illness. All other systems reviewed and are negative   Past Medical History:  She,  has a past medical history of Anemia, iron deficiency (12/22/2014), Arthritis, ASTHMA (05/12/2009), Bipolar disorder ([place]), COPD (chronic obstructive pulmonary disease) ([place]), Eczema (05/18/2022), Fibromyalgia (05/14/2014), GERD (gastroesophageal reflux disease), History of kidney stones, Hyperlipidemia (04/20/2017), HYPERTENSION (05/12/2009), Peripheral  vascular disease ([place]), Pneumonia, Prediabetes (02/23/2014), Seizure ([place]), Stroke ([place]) (11/2020), and Urticaria.   Surgical History:   Past Surgical History:  Procedure Laterality Date   ABDOMINAL HYSTERECTOMY     ANTERIOR CERVICAL DECOMP/DISCECTOMY FUSION N/A 07/28/2020   Procedure: ANTERIOR CERVICAL DECOMPRESSION/DISCECTOMY FUSION. INTERBODY PROTHESIS, PLATE/SCREWS CERVICAL THREE- CERVICAL FOUR, CERVICAL FOUR- CERVICAL FIVE;  Surgeon: Newman Pies, MD;  Location: [facility];  Service: Neurosurgery;  Laterality: N/A;   BACK SURGERY     COLONOSCOPY  12/20/2011   Procedure: COLONOSCOPY;  Surgeon: Landry Dyke, MD;  Location: [facility];  Service: Endoscopy;  Laterality: N/A;   COLONOSCOPY  03/05/2012   Procedure: COLONOSCOPY;  Surgeon: Landry Dyke, MD;  Location: [facility];  Service: Endoscopy;  Laterality: N/A;   DIAGNOSTIC LAPAROSCOPY     HEMORRHOID SURGERY     INCISE AND DRAIN ABCESS     KIDNEY STONE SURGERY     NECK SURGERY  x 2 Dr Orinda Kenner   SPINE SURGERY  2019   TOE SURGERY     TUBAL LIGATION       Social History:   reports that she quit smoking about 4 years ago. Her smoking use included cigarettes. She has a 9.00 pack-year smoking history. She has never used smokeless tobacco. She reports that she does not currently use alcohol. She reports that she does not currently use drugs after having used the following drugs: Marijuana.   Family History:  Her family history includes Alcohol abuse in her brother and father; Anemia in her daughter, sister, and sister; Anesthesia problems in her father; Asthma in her brother, mother, and sister; Brain cancer in her sister; Breast cancer (age of onset: 58) in her sister; COPD in her mother and sister; Colon cancer (age of onset: 70) in her father; Diabetes in her brother, father, mother, sister, sister, sister, and sister; Heart disease in her brother, brother, mother, and sister; Hypertension in her brother, daughter, sister,  sister, sister, sister, and sister; Kidney disease in her father; Sleep apnea in her brother. There is no history of Allergic rhinitis, Eczema, Urticaria, Esophageal cancer, Prostate cancer, or Rectal cancer.   Allergies Allergies  Allergen Reactions   Sulfa Antibiotics Nausea And Vomiting and Other (See Comments)   Venofer [Iron Sucrose] Hives     Home Medications  Prior to Admission medications   Medication Sig Start Date End Date Taking? Authorizing Provider  albuterol (VENTOLIN HFA) 108 (90 Base) MCG/ACT inhaler Inhale 2 puffs into the lungs every 6 (six) hours as needed for wheezing or shortness of breath. 10/18/22  Yes Valentina Shaggy, MD  atorvastatin (LIPITOR) 40 MG tablet Take 1 tablet (40 mg total) by mouth daily. TAKE 1 TABLET(40 MG) BY MOUTH DAILY Strength: 40 mg Patient taking differently: Take 40 mg by mouth daily. 05/23/22  Yes Biagio Borg, MD  azelastine (OPTIVAR) 0.05 % ophthalmic solution Place 1 drop into both eyes 2 (two) times daily. 05/18/22  Yes Biagio Borg, MD  BELSOMRA 15 MG TABS Take 15 mg by mouth at bedtime as needed (sleep). 05/24/22  Yes [provider]  BREZTRI AEROSPHERE 160-9-4.8 MCG/ACT AERO Inhale 2 puffs into the lungs in the morning and at bedtime. 11/27/22  Yes [provider]  butalbital-acetaminophen-caffeine (FIORICET) 50-325-40 MG tablet 1 tab by mouth once daily as needed for HA, limit 14 per month 09/17/22  Yes Biagio Borg, MD  carbamazepine (TEGRETOL) 200 MG tablet Take 2-3 tablets (400-600 mg total) by mouth See admin instructions. Take '400mg'$  in the morning and '600mg'$  at bedtime. 02/06/23  Yes Plovsky, Berneta Sages, MD  cetirizine (ZYRTEC) 10 MG tablet Can take one tablet by mouth once daily if needed for runny nose. Patient taking differently: Take 10 mg by mouth daily as needed for rhinitis. 04/28/21  Yes Ambs, Kathrine Cords, FNP  Cholecalciferol (VITAMIN D) 125 MCG (5000 UT) CAPS Take 5,000 Units by mouth daily.   Yes [provider]  cyclobenzaprine (FLEXERIL) 5 MG tablet Take 5 mg by mouth 3 (three) times daily as needed for muscle spasms. 10/25/22  Yes [provider]  famotidine (PEPCID) 40 MG tablet Take 1 tablet (40 mg total) by mouth at bedtime. 01/22/23  Yes Martyn Ehrich, NP  ferrous sulfate 325 (65 FE) MG EC tablet Take 1 tablet (325 mg total) by mouth daily with breakfast. 09/14/22  Yes Biagio Borg, MD  gabapentin (NEURONTIN) 100 MG capsule Take 1 capsule (  100 mg total) by mouth 3 (three) times daily. 12/21/22  Yes Plovsky, Berneta Sages, MD  hydrochlorothiazide (MICROZIDE) 12.5 MG capsule Take 1 capsule (12.5 mg total) by mouth daily. TAKE 1 CAPSULE(12.5 MG) BY MOUTH DAILY Strength: 12.5 mg Patient taking differently: Take 12.5 mg by mouth daily. 05/23/22  Yes Biagio Borg, MD  hydrOXYzine (VISTARIL) 25 MG capsule Take 1 capsule (25 mg total) by mouth every 6 (six) hours as needed for anxiety. 02/06/23  Yes Plovsky, Berneta Sages, MD  ipratropium-albuterol (DUONEB) 0.5-2.5 (3) MG/3ML SOLN Take 3 mLs by nebulization 2 (two) times daily. 01/15/23  Yes Hosie Poisson, MD  irbesartan (AVAPRO) 75 MG tablet TAKE 1 TABLET(75 MG) BY MOUTH DAILY Patient taking differently: Take 75 mg by mouth daily. 11/06/21  Yes Biagio Borg, MD  montelukast (SINGULAIR) 10 MG tablet Take 1 tablet (10 mg total) by mouth at bedtime. 10/18/22 02/21/24 Yes Valentina Shaggy, MD  ondansetron (ZOFRAN ODT) 4 MG disintegrating tablet Take 1 tablet (4 mg total) by mouth every 8 (eight) hours as needed for nausea or vomiting. 05/19/21  Yes Valentina Shaggy, MD  pantoprazole (PROTONIX) 40 MG tablet Take 1 tablet (40 mg total) by mouth 2 (two) times daily. 01/22/23  Yes Martyn Ehrich, NP  potassium chloride (KLOR-CON M) 10 MEQ tablet Take 1 tablet (10 mEq total) by mouth daily. 05/23/22  Yes Biagio Borg, MD  revefenacin [facility]) 175 MCG/3ML nebulizer solution Take 3 mLs (175 mcg total) by nebulization daily. 10/18/22  Yes Valentina Shaggy, MD   Skin Protectants, Misc. (EUCERIN) cream Apply 1 Application topically daily as needed for dry skin.   Yes [provider]  tacrolimus (PROTOPIC) 0.03 % ointment Apply topically 2 (two) times daily. Patient taking differently: Apply 1 Application topically 2 (two) times daily as needed (rash). 08/02/22  Yes Valentina Shaggy, MD  triamcinolone ointment (KENALOG) 0.1 % Apply 1 Application topically 2 (two) times daily. Patient taking differently: Apply 1 Application topically at bedtime. 10/25/22  Yes Ambs, Kathrine Cords, FNP  fluticasone (FLONASE) 50 MCG/ACT nasal spray Place 1 spray into both nostrils 2 (two) times daily as needed for allergies or rhinitis. 10/19/22   Valentina Shaggy, MD     Critical care time: NA  Dung Salinger D. Harris, NP-C  Pulmonary & Critical Care Personal contact information can be found on Amion  If no contact or response made please call [phone] 02/25/2023, 12:57 PM

## 2023-02-25 NOTE — TOC Initial Note (Signed)
Transition of Care [facility]) - Initial/Assessment Note    Patient Details  Name: [name] MRN: [id number] Date of Birth: 08/25/1961  [facility] CM/SW Contact:    Cyndi Bender, RN Phone Number: 02/25/2023, 2:58 PM  Clinical Narrative:                 Spoke to patient at bedside regarding transition needs.  Patient is agreeable to go to speech therapy at [address].  Referral sent and information added to AVS. Patient lives alone and has cpap and nebulizer at home. Patient drives herself to apts.  Patient's son will transport home once stable for discharge.  Address, Phone number and PCP verified. TOC will continue to follow for needs.    Expected Discharge Plan: Home/Self Care Barriers to Discharge: Continued Medical Work up   Patient Goals and CMS Choice Patient states their goals for this hospitalization and ongoing recovery are:: return home          Expected Discharge Plan and Services       Living arrangements for the past 2 months: Apartment                                      Prior Living Arrangements/Services Living arrangements for the past 2 months: Apartment Lives with:: Self Patient language and need for interpreter reviewed:: Yes Do you feel safe going back to the place where you live?: Yes      Need for Family Participation in Patient Care: Yes (Comment) Care giver support system in place?: Yes (comment) Current home services: DME (nebulizer, cpap) Criminal Activity/Legal Involvement Pertinent to Current Situation/Hospitalization: No - Comment as needed  Activities of Daily Living Home Assistive Devices/Equipment: Cane (specify quad or straight), Walker (specify type), Shower chair with back ADL Screening (condition at time of admission) Patient's cognitive ability adequate to safely complete daily activities?: Yes Is the patient deaf or have difficulty hearing?: No Does the patient have difficulty  seeing, even when wearing glasses/contacts?: No Does the patient have difficulty concentrating, remembering, or making decisions?: No Patient able to express need for assistance with ADLs?: Yes Does the patient have difficulty dressing or bathing?: No Independently performs ADLs?: Yes (appropriate for developmental age) Does the patient have difficulty walking or climbing stairs?: Yes Weakness of Legs: Right Weakness of Arms/Hands: Left  Permission Sought/Granted                  Emotional Assessment   Attitude/Demeanor/Rapport: Gracious Affect (typically observed): Accepting Orientation: : Oriented to Self, Oriented to Place, Oriented to  Time, Oriented to Situation Alcohol / Substance Use: Not Applicable Psych Involvement: No (comment)  Admission diagnosis:  COPD exacerbation (HCC) [J44.1] Acute exacerbation of COPD with asthma ([place]) [J44.1, J45.901] Acute respiratory failure, unspecified whether with hypoxia or hypercapnia ([place]) [J96.00] Patient Active Problem List   Diagnosis Date Noted   Urinary incontinence 01/25/2023   Acute asthma exacerbation 01/12/2023   Influenza A 01/11/2023   Acute exacerbation of COPD with asthma ([place]) 01/11/2023   Bilateral impacted cerumen 12/04/2022   Not well controlled severe persistent asthma 10/25/2022   Acute on chronic respiratory failure with hypoxemia ([place]) 10/07/2022   Abnormal TSH 09/13/2022   Chronic sinusitis 08/14/2022   PND (paroxysmal nocturnal dyspnea) 06/07/2022   Vitamin D deficiency 05/20/2022   Eczema 05/18/2022   Insomnia 05/18/2022   Allergic conjunctivitis 05/18/2022   Chronic  respiratory failure with hypoxia and hypercapnia (HCC) 04/30/2022   Bilateral hearing loss 04/14/2022   Moderate persistent asthma without complication [id number]   Low back pain 10/11/2021   Abnormal CXR 07/17/2021   Aortic atherosclerosis ([place]) 07/05/2021   URI (upper respiratory infection) 07/05/2021   Arthrodesis status 06/20/2021    Drug allergy 05/14/2021   Acute cough 05/14/2021   Bilateral leg weakness 04/17/2021   Status post cervical spinal fusion 12/27/2020   History of stroke 12/11/2020   Medication management 10/21/2020   Body mass index (BMI) 26.0-26.9, adult 08/23/2020   Healthcare maintenance 08/11/2020   Cervical spondylosis with myelopathy and radiculopathy 07/28/2020   Preop exam for internal medicine 06/19/2020   Hypotension due to medication 06/11/2020   Status asthmaticus 06/09/2020   Essential (primary) hypertension 05/17/2020   Other spondylosis with myelopathy, cervical region 02/16/2020   Cervical spondylosis 02/02/2020   Radiculopathy, cervical region 02/02/2020   Spondylolysis, cervical region 02/02/2020   Neck pain 02/02/2020   Leg cramping 08/12/2019   Laryngopharyngeal reflux (LPR) 05/26/2019   Sinus pressure 05/26/2019   Hypophosphatemia 01/07/2019   Vertigo 09/23/2018   Dysfunction of left eustachian tube 09/23/2018   Gait disorder 04/21/2018   Leukocytosis 04/15/2018   Nausea & vomiting 04/15/2018   Seizure disorder ([place])    Prolapsed cervical intervertebral disc 03/11/2018   Cervical radiculopathy 02/18/2018   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   Syncope 09/04/2017   Constipation 09/04/2017   Nonintractable headache 09/04/2017   Leg swelling 07/26/2017   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   Hyperlipidemia 04/20/2017   Pedal edema 04/19/2017   Bipolar I disorder ([place]) 09/19/2016   Facial pain 07/12/2016   Rash 06/14/2016   Migraine 01/25/2016   Idiopathic urticaria/pruritus 01/23/2016   Allergic rhinitis 01/23/2016   Oral candidiasis 01/23/2016   Greater trochanteric bursitis of both hips 09/08/2015   Chest pain 06/22/2015   Itching 06/22/2015   Bilateral knee pain 06/22/2015   Bilateral hip pain 06/22/2015   Anemia, iron deficiency 12/22/2014   Fatigue 12/21/2014   Rash and nonspecific skin eruption 08/24/2014   Intertrigo 08/07/2014   Dyspareunia 07/12/2014    Menopausal syndrome (hot flushes) 07/12/2014   History of tobacco abuse 07/12/2014   Dizziness 06/22/2014   Weakness 06/22/2014   Fibromyalgia 05/14/2014   Encounter for well adult exam with abnormal findings 04/22/2014   Recurrent boils 04/22/2014   Recurrent falls 04/22/2014   Peripheral vascular disease ([place])    Depression    Anxiety    Gastroesophageal reflux disease    Prediabetes 02/23/2014   Screening mammogram for high-risk patient 02/23/2014   Back pain 07/22/2013   COPD/asthma 08/24/2009   Headache(784.0) 08/24/2009   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   PCP:  Biagio Borg, MD Pharmacy:   [facility] [place] [place] [address] Phone: [phone] Fax: [phone]  [place], [place] [address] Phone: [phone] Fax: [phone]     Social Determinants of Health (SDOH) Social History: SDOH Screenings   Food Insecurity: No Food Insecurity (02/23/2023)  Recent Concern: Food Insecurity - Food Insecurity Present (01/11/2023)  Housing: Low Risk  (02/23/2023)  Transportation Needs: No Transportation Needs (02/23/2023)  Recent Concern: Transportation Needs - Unmet Transportation Needs (01/11/2023)  Utilities: Not At Risk (02/23/2023)  Alcohol Screen: Low Risk  (11/26/2022)  Depression (PHQ2-9): Low Risk  (  01/25/2023)  Recent Concern: Depression (PHQ2-9) - Medium Risk (11/26/2022)  Financial Resource Strain: Low Risk  (11/26/2022)  Physical Activity: Sufficiently Active (11/26/2022)  Social Connections: Socially Isolated (11/26/2022)  Stress: No Stress Concern Present (11/26/2022)  Tobacco Use: Medium Risk (02/21/2023)   SDOH Interventions:     Readmission Risk Interventions     No data to display

## 2023-02-25 NOTE — Telephone Encounter (Signed)
As pt is currently in hospital and has a current attending MD - please ask pt to reqeust this from that provider

## 2023-02-25 NOTE — Telephone Encounter (Signed)
Routing to Dr. Byrum as an FYI 

## 2023-02-25 NOTE — Evaluation (Signed)
Physical Therapy Evaluation/ Discharge Patient Details Name: [name] MRN: [id number] DOB: 10-03-1961 Today's Date: 02/25/2023  History of Present Illness  62 yo female admitted 3/7 with SOB due to acute COPD exacerbation. PMHx: COPD, HTN, bipolar, fibromyalgia, GERD, CVA  Clinical Impression  Pt pleasant and reports fatigue and SOB for over a year limiting functional mobility. Pt with use of RW at home as needed and assist of family. Pt has home pulse oximeter and reports will all trials she has maintained >90% on RA just as she did during this session. Pt also utilizing energy conservation techniques at home including RW and shower seat. Pt educated for IS use as pt barely pulling 600cc and educated for lung expansion and technique. Pt verbalized understanding of all education, is at baseline and does not require further acute therapy at this time, will sign off with pt aware and agreeable.         Recommendations for follow up therapy are one component of a multi-disciplinary discharge planning process, led by the attending physician.  Recommendations may be updated based on patient status, additional functional criteria and insurance authorization.  Follow Up Recommendations No PT follow up      Assistance Recommended at Discharge PRN  Patient can return home with the following  Assistance with cooking/housework    Equipment Recommendations None recommended by PT  Recommendations for Other Services       Functional Status Assessment Patient has not had a recent decline in their functional status     Precautions / Restrictions Precautions Precautions: None      Mobility  Bed Mobility Overal bed mobility: Modified Independent             General bed mobility comments: HOB 40 degrees    Transfers Overall transfer level: Modified independent                      Ambulation/Gait Ambulation/Gait assistance: Modified independent  (Device/Increase time) Gait Distance (Feet): 280 Feet Assistive device: Rolling walker (2 wheels) Gait Pattern/deviations: Step-through pattern, Decreased stride length, Trunk flexed   Gait velocity interpretation: >2.62 ft/sec, indicative of community ambulatory   General Gait Details: pt with reliance on RW with noted wheezing throughout gait but SPO2 maintained 90-92% on RA, steady gait  Stairs            Wheelchair Mobility    Modified Rankin (Stroke Patients Only)       Balance Overall balance assessment: Mild deficits observed, not formally tested                                           Pertinent Vitals/Pain Pain Assessment Pain Assessment: No/denies pain    Home Living Family/patient expects to be discharged to:: Private residence Living Arrangements: Alone Available Help at Discharge: Family;Available PRN/intermittently Type of Home: Apartment Home Access: Level entry       Home Layout: One level Home Equipment: Conservation officer, nature (2 wheels);Cane - single point;Shower seat      Prior Function Prior Level of Function : Needs assist             Mobility Comments: uses RW ADLs Comments: grandson assists with cooking/cleaning on the weekend     Hand Dominance        Extremity/Trunk Assessment   Upper Extremity Assessment Upper Extremity Assessment: Overall WFL for tasks assessed  Lower Extremity Assessment Lower Extremity Assessment: Overall WFL for tasks assessed    Cervical / Trunk Assessment Cervical / Trunk Assessment: Normal  Communication   Communication: No difficulties  Cognition Arousal/Alertness: Awake/alert Behavior During Therapy: WFL for tasks assessed/performed Overall Cognitive Status: Within Functional Limits for tasks assessed                                          General Comments      Exercises     Assessment/Plan    PT Assessment Patient does not need any further PT  services  PT Problem List         PT Treatment Interventions      PT Goals (Current goals can be found in the Care Plan section)  Acute Rehab PT Goals PT Goal Formulation: All assessment and education complete, DC therapy    Frequency       Co-evaluation               AM-PAC PT "6 Clicks" Mobility  Outcome Measure Help needed turning from your back to your side while in a flat bed without using bedrails?: None Help needed moving from lying on your back to sitting on the side of a flat bed without using bedrails?: None Help needed moving to and from a bed to a chair (including a wheelchair)?: None Help needed standing up from a chair using your arms (e.g., wheelchair or bedside chair)?: None Help needed to walk in hospital room?: None Help needed climbing 3-5 steps with a railing? : A Little 6 Click Score: 23    End of Session   Activity Tolerance: Patient tolerated treatment well Patient left: in chair;with call bell/phone within reach Nurse Communication: Mobility status PT Visit Diagnosis: Other abnormalities of gait and mobility (R26.89)    Time: [id number] PT Time Calculation (min) (ACUTE ONLY): 16 min   Charges:   PT Evaluation $PT Eval Low Complexity: 1 Low          Breslyn Abdo P, PT Acute Rehabilitation Services Office: Pearl B Durante Violett 02/25/2023, 12:22 PM

## 2023-02-25 NOTE — Care Management Important Message (Signed)
Important Message  Patient Details  Name: [name] MRN: [id number] Date of Birth: [birth year]   Medicare Important Message Given:  Yes     Orbie Pyo 02/25/2023, 3:12 PM

## 2023-02-25 NOTE — Progress Notes (Signed)
SATURATION QUALIFICATIONS: (This note is used to comply with regulatory documentation for home oxygen)  Patient Saturations on Room Air at Rest = 93%  Patient Saturations on Room Air while Ambulating = 91%  Patient was 91% on room air most of the walk. Briefly dropped to 88-89% but quickly recovered. MD notified. Patient does not qualify for oxygen.

## 2023-02-25 NOTE — Progress Notes (Signed)
PROGRESS NOTE    [name]  [id number] DOB: Apr 17, 1961 DOA: 02/21/2023 PCP: Biagio Borg, MD   Brief Narrative:  62 year old female with past medical history of COPD, hypertension, bipolar, fibromyalgia.  Patient recently admitted in January with influenza, COPD exacerbation and respiratory failure.  Per patient since discharge she has not been well.  She has been back-and-forth between her pulmonologist and her PCP without improvement.  Per patient she remains quite dyspneic and short of breath.  Patient was diagnosed with COPD/asthma exacerbation.  Initially required BiPAP in the ED and later transitioned to nasal cannula and then room air.  Troponins, D-dimer was negative.  CT chest showed changes with emphysema.  BNP was normal as well.   Assessment & Plan:  Active Problems:   Chronic respiratory failure with hypoxia and hypercapnia (HCC)   Seizure disorder (HCC)   Essential (primary) hypertension   Fibromyalgia   Bipolar I disorder (HCC)   History of stroke   Acute exacerbation of COPD with asthma ([place])     Acute COPD exacerbation-acute asthma overlap syndrome -Patient seems to be improving.  Although on no longer hypoxic she gets quite dyspneic on exertion.  BNP, D-dimer negative.  Currently on steroids, bronchodilators.  I-S/flutter valve.  Daily Singulair. Empiric doxycycline Later in the day when I visited her she is started having increasing expiratory wheezing.  She sees pulmonary outpatient, will consult inpatient team.   Essential hypertension  Controlled.  Continue current regimen-HCTZ, Avapro.  IV as needed   Hyperlipidemia  Atorvastatin.   GERD  Continue Protonix and Pepcid.   Bipolar disorder Continue Tegretol   Chronic headache  Resume Fioricet.   Dyspnea on minimal exertion: BNP and troponin are negative.  Echocardiogram overall unremarkable with preserved EF   Obesity: -Diet and exercise.   Liver cirrhosis: LFTs are normal,  acute hepatitis panel is negative.  Follow-up outpatient   DVT prophylaxis: Lovenox Code Status: Full code Family Communication:    Status is: Inpatient    Subjective:  Patient ambulated this morning and did not have any signs of hypoxia but later after she showered she acutely became short of breath with diffuse wheezing especially in the upper airway area.  Examination:  General exam: Appears calm and comfortable  Respiratory system: Bilateral expiratory wheezing Cardiovascular system: S1 & S2 heard, RRR. No JVD, murmurs, rubs, gallops or clicks. No pedal edema. Gastrointestinal system: Abdomen is nondistended, soft and nontender. No organomegaly or masses felt. Normal bowel sounds heard. Central nervous system: Alert and oriented. No focal neurological deficits. Extremities: Symmetric 5 x 5 power. Skin: No rashes, lesions or ulcers Psychiatry: Judgement and insight appear normal. Mood & affect appropriate.     Objective: Vitals:   02/24/23 1946 02/24/23 1954 02/25/23 0022 02/25/23 0449  BP: 115/79  114/70 131/83  Pulse: 83  71 66  Resp: '17  12 16  '$ Temp: 98.6 F (37 C)  98.3 F (36.8 C) 97.9 F (36.6 C)  TempSrc: Oral  Oral Oral  SpO2: 93% 94% 94% 94%  Weight:      Height:        Intake/Output Summary (Last 24 hours) at 02/25/2023 0745 Last data filed at 02/25/2023 0451 Gross per 24 hour  Intake 240 ml  Output 2700 ml  Net -2460 ml   Filed Weights   02/21/23 2058 02/22/23 1531  Weight: 87.5 kg 88.1 kg     Data Reviewed:   CBC: Recent Labs  Lab 02/21/23 2100 02/21/23 2126 02/22/23 0116  02/22/23 0525  WBC 12.1*  --  10.7* 6.2  NEUTROABS 7.5  --  9.9* 2.1  HGB 10.3* 9.9* 10.2* 12.6  HCT 31.6* 29.0* 31.2* 38.6  MCV 96.9  --  95.7 93.5  PLT 329  --  310 [id number]   Basic Metabolic Panel: Recent Labs  Lab 02/21/23 2100 02/21/23 2126 02/22/23 0116 02/22/23 0525  NA 141 142 141 139  K 3.6 3.4* 4.0 3.9  CL 105  --  107 107  CO2 27  --  25 22   GLUCOSE 171*  --  166* 181*  BUN 7  --  8 14  CREATININE 0.68  --  0.64 0.86  CALCIUM 8.8*  --  8.7* 9.3  MG 3.0*  --  2.3  --    GFR: Estimated Creatinine Clearance: 73.2 mL/min (by C-G formula based on SCr of 0.86 mg/dL). Liver Function Tests: Recent Labs  Lab 02/21/23 2100 02/24/23 0012  AST 24 22  ALT 21 22  ALKPHOS 80 70  BILITOT 0.3 0.4  PROT 5.7* 6.3*  ALBUMIN 3.3* 3.3*   No results for input(s): "LIPASE", "AMYLASE" in the last 168 hours. No results for input(s): "AMMONIA" in the last 168 hours. Coagulation Profile: No results for input(s): "INR", "PROTIME" in the last 168 hours. Cardiac Enzymes: No results for input(s): "CKTOTAL", "CKMB", "CKMBINDEX", "TROPONINI" in the last 168 hours. BNP (last 3 results) No results for input(s): "PROBNP" in the last 8760 hours. HbA1C: No results for input(s): "HGBA1C" in the last 72 hours. CBG: Recent Labs  Lab 02/24/23 0616 02/24/23 1112 02/24/23 1535 02/24/23 2115 02/25/23 0632  GLUCAP 122* 182* 130* 193* 90   Lipid Profile: No results for input(s): "CHOL", "HDL", "LDLCALC", "TRIG", "CHOLHDL", "LDLDIRECT" in the last 72 hours. Thyroid Function Tests: No results for input(s): "TSH", "T4TOTAL", "FREET4", "T3FREE", "THYROIDAB" in the last 72 hours. Anemia Panel: No results for input(s): "VITAMINB12", "FOLATE", "FERRITIN", "TIBC", "IRON", "RETICCTPCT" in the last 72 hours. Sepsis Labs: No results for input(s): "PROCALCITON", "LATICACIDVEN" in the last 168 hours.  Recent Results (from the past 240 hour(s))  Resp panel by RT-PCR (RSV, Flu A&B, Covid) Anterior Nasal Swab     Status: None   Collection Time: 02/21/23 11:19 PM   Specimen: Anterior Nasal Swab  Result Value Ref Range Status   SARS Coronavirus 2 by RT PCR NEGATIVE NEGATIVE Final   Influenza A by PCR NEGATIVE NEGATIVE Final   Influenza B by PCR NEGATIVE NEGATIVE Final    Comment: (NOTE) The Xpert Xpress SARS-CoV-2/FLU/RSV plus assay is intended as an aid in  the diagnosis of influenza from Nasopharyngeal swab specimens and should not be used as a sole basis for treatment. Nasal washings and aspirates are unacceptable for Xpert Xpress SARS-CoV-2/FLU/RSV testing.  Fact Sheet for Patients: EntrepreneurPulse.com.au  Fact Sheet for Healthcare Providers: IncredibleEmployment.be  This test is not yet approved or cleared by the [place] FDA and has been authorized for detection and/or diagnosis of SARS-CoV-2 by FDA under an Emergency Use Authorization (EUA). This EUA will remain in effect (meaning this test can be used) for the duration of the COVID-19 declaration under Section 564(b)(1) of the Act, 21 U.S.C. section 360bbb-3(b)(1), unless the authorization is terminated or revoked.     Resp Syncytial Virus by PCR NEGATIVE NEGATIVE Final    Comment: (NOTE) Fact Sheet for Patients: EntrepreneurPulse.com.au  Fact Sheet for Healthcare Providers: IncredibleEmployment.be  This test is not yet approved or cleared by the [place] and has been authorized  for detection and/or diagnosis of SARS-CoV-2 by FDA under an Emergency Use Authorization (EUA). This EUA will remain in effect (meaning this test can be used) for the duration of the COVID-19 declaration under Section 564(b)(1) of the Act, 21 U.S.C. section 360bbb-3(b)(1), unless the authorization is terminated or revoked.  Performed at [facility], [place] [address], [place], [place] [address]   MRSA Next Gen by PCR, Nasal     Status: None   Collection Time: 02/22/23  3:33 PM   Specimen: Nasal Mucosa; Nasal Swab  Result Value Ref Range Status   MRSA by PCR Next Gen NOT DETECTED NOT DETECTED Final    Comment: (NOTE) The GeneXpert MRSA Assay (FDA approved for NASAL specimens only), is one component of a comprehensive MRSA colonization surveillance program. It is not intended to diagnose MRSA infection  nor to guide or monitor treatment for MRSA infections. Test performance is not FDA approved in patients less than 69 years old. Performed at [facility], [place] [address] [place], [place] [address]          Radiology Studies: ECHOCARDIOGRAM COMPLETE  Result Date: 02/24/2023    ECHOCARDIOGRAM REPORT   Patient Name:   EMERSYN FRIAS Date of Exam: 02/24/2023 Medical Rec #:  [id number]            Height:       63.0 in Accession #:    [id number]           Weight:       194.2 lb Date of Birth:  1961/03/09             BSA:          1.910 m Patient Age:    34 years             BP:           127/76 mmHg Patient Gender: F                    HR:           84 bpm. Exam Location:  Inpatient Procedure: 2D Echo, Cardiac Doppler and Color Doppler Indications:    CHF-Acute Diastolic [id number]  History:        Patient has prior history of Echocardiogram examinations, most                 recent 06/10/2020. Stroke, Signs/Symptoms:Shortness of Breath;                 Risk Factors:Former Smoker, Hypertension and Dyslipidemia.  Sonographer:    Greer Pickerel Referring Phys: [id number] SYLVESTER I OGBATA  Sonographer Comments: Suboptimal parasternal window. Image acquisition challenging due to patient body habitus and Image acquisition challenging due to respiratory motion. IMPRESSIONS  1. Left ventricular ejection fraction, by estimation, is 60 to 65%. The left ventricle has normal function. The left ventricle has no regional wall motion abnormalities. Left ventricular diastolic parameters were normal.  2. Right ventricular systolic function is normal. The right ventricular size is normal. There is normal pulmonary artery systolic pressure.  3. The mitral valve is normal in structure. No evidence of mitral valve regurgitation. No evidence of mitral stenosis.  4. The aortic valve has an indeterminant number of cusps. There is mild calcification of the aortic valve. There is mild thickening of the aortic valve. Aortic valve  regurgitation is not visualized. No aortic stenosis is present.  5. The inferior vena cava is normal in size  with greater than 50% respiratory variability, suggesting right atrial pressure of 3 mmHg. FINDINGS  Left Ventricle: Left ventricular ejection fraction, by estimation, is 60 to 65%. The left ventricle has normal function. The left ventricle has no regional wall motion abnormalities. The left ventricular internal cavity size was normal in size. There is  no left ventricular hypertrophy. Left ventricular diastolic parameters were normal. Right Ventricle: The right ventricular size is normal. Right vetricular wall thickness was not well visualized. Right ventricular systolic function is normal. There is normal pulmonary artery systolic pressure. The tricuspid regurgitant velocity is 1.15 m/s, and with an assumed right atrial pressure of 3 mmHg, the estimated right ventricular systolic pressure is 8.3 mmHg. Left Atrium: Left atrial size was normal in size. Right Atrium: Right atrial size was normal in size. Pericardium: There is no evidence of pericardial effusion. Mitral Valve: The mitral valve is normal in structure. No evidence of mitral valve regurgitation. No evidence of mitral valve stenosis. Tricuspid Valve: The tricuspid valve is normal in structure. Tricuspid valve regurgitation is not demonstrated. No evidence of tricuspid stenosis. Aortic Valve: The aortic valve has an indeterminant number of cusps. There is mild calcification of the aortic valve. There is mild thickening of the aortic valve. There is mild aortic valve annular calcification. Aortic valve regurgitation is not visualized. No aortic stenosis is present. Aortic valve mean gradient measures 5.0 mmHg. Aortic valve peak gradient measures 9.8 mmHg. Aortic valve area, by VTI measures 1.71 cm. Pulmonic Valve: The pulmonic valve was not well visualized. Pulmonic valve regurgitation is not visualized. No evidence of pulmonic stenosis. Aorta: The  aortic root and ascending aorta are structurally normal, with no evidence of dilitation. Venous: The inferior vena cava is normal in size with greater than 50% respiratory variability, suggesting right atrial pressure of 3 mmHg. IAS/Shunts: No atrial level shunt detected by color flow Doppler.  LEFT VENTRICLE PLAX 2D LVIDd:         4.60 cm   Diastology LVIDs:         3.10 cm   LV e' medial:    7.72 cm/s LV PW:         1.00 cm   LV E/e' medial:  11.9 LV IVS:        0.90 cm   LV e' lateral:   11.10 cm/s LVOT diam:     1.90 cm   LV E/e' lateral: 8.3 LV SV:         49 LV SV Index:   26 LVOT Area:     2.84 cm  RIGHT VENTRICLE RV S prime:     11.90 cm/s TAPSE (M-mode): 2.1 cm LEFT ATRIUM             Index        RIGHT ATRIUM           Index LA diam:        3.40 cm 1.78 cm/m   RA Area:     11.50 cm LA Vol (A2C):   50.7 ml 26.55 ml/m  RA Volume:   27.60 ml  14.45 ml/m LA Vol (A4C):   43.2 ml 22.62 ml/m LA Biplane Vol: 47.1 ml 24.66 ml/m  AORTIC VALVE AV Area (Vmax):    1.72 cm AV Area (Vmean):   1.67 cm AV Area (VTI):     1.71 cm AV Vmax:           156.85 cm/s AV Vmean:  106.875 cm/s AV VTI:            0.288 m AV Peak Grad:      9.8 mmHg AV Mean Grad:      5.0 mmHg LVOT Vmax:         95.40 cm/s LVOT Vmean:        62.900 cm/s LVOT VTI:          0.174 m LVOT/AV VTI ratio: 0.60  AORTA Ao Root diam: 3.60 cm Ao Asc diam:  3.40 cm MITRAL VALVE               TRICUSPID VALVE MV Area (PHT): 4.17 cm    TR Peak grad:   5.3 mmHg MV Decel Time: 182 msec    TR Vmax:        115.00 cm/s MV E velocity: 92.20 cm/s MV A velocity: 82.30 cm/s  SHUNTS MV E/A ratio:  1.12        Systemic VTI:  0.17 m                            Systemic Diam: 1.90 cm Carlyle Dolly MD Electronically signed by Carlyle Dolly MD Signature Date/Time: 02/24/2023/1:51:12 PM    Final         Scheduled Meds:  atorvastatin  40 mg Oral QHS   budesonide (PULMICORT) nebulizer solution  0.5 mg Nebulization BID   carbamazepine  400 mg Oral Daily    And   carbamazepine  600 mg Oral QHS   cholecalciferol  5,000 Units Oral Daily   dextromethorphan-guaiFENesin  1 tablet Oral BID   doxycycline  100 mg Oral Q12H   enoxaparin (LOVENOX) injection  40 mg Subcutaneous Q24H   famotidine  40 mg Oral QHS   ferrous sulfate  325 mg Oral Q breakfast   gabapentin  100 mg Oral TID   hydrochlorothiazide  12.5 mg Oral Daily   insulin aspart  0-15 Units Subcutaneous TID WC   insulin aspart  0-5 Units Subcutaneous QHS   ipratropium-albuterol  3 mL Nebulization Q6H   irbesartan  75 mg Oral Daily   ketotifen  1 drop Both Eyes BID   methylPREDNISolone (SOLU-MEDROL) injection  40 mg Intravenous Q12H   montelukast  10 mg Oral QHS   Continuous Infusions:   LOS: 3 days   Time spent= 35 mins    Dianelys Scinto Arsenio Loader, MD [facility]  If 7PM-7AM, please contact night-coverage  02/25/2023, 7:45 AM

## 2023-02-26 ENCOUNTER — Ambulatory Visit: Payer: Medicare HMO

## 2023-02-26 ENCOUNTER — Ambulatory Visit: Payer: Medicare HMO | Admitting: Allergy & Immunology

## 2023-02-26 DIAGNOSIS — J45901 Unspecified asthma with (acute) exacerbation: Secondary | ICD-10-CM | POA: Diagnosis not present

## 2023-02-26 DIAGNOSIS — J441 Chronic obstructive pulmonary disease with (acute) exacerbation: Secondary | ICD-10-CM | POA: Diagnosis not present

## 2023-02-26 LAB — GLUCOSE, CAPILLARY
Glucose-Capillary: 203 mg/dL — ABNORMAL HIGH (ref 70–99)
Glucose-Capillary: 148 mg/dL — ABNORMAL HIGH (ref 70–99)
Glucose-Capillary: 205 mg/dL — ABNORMAL HIGH (ref 70–99)
Glucose-Capillary: 144 mg/dL — ABNORMAL HIGH (ref 70–99)
Glucose-Capillary: 126 mg/dL — ABNORMAL HIGH (ref 70–99)

## 2023-02-26 MED ORDER — REVEFENACIN 175 MCG/3ML IN SOLN
175.0000 ug | Freq: Every day | RESPIRATORY_TRACT | Status: DC
  Administered 2023-02-26 – 2023-02-27 (×2): 175 ug via RESPIRATORY_TRACT
  Filled 2023-02-26 (×2): qty 3

## 2023-02-26 MED ORDER — PREDNISONE 20 MG PO TABS
40.0000 mg | ORAL_TABLET | Freq: Every day | ORAL | Status: DC
  Administered 2023-02-27: 40 mg via ORAL
  Filled 2023-02-26: qty 2

## 2023-02-26 MED ORDER — ARFORMOTEROL TARTRATE 15 MCG/2ML IN NEBU
15.0000 ug | INHALATION_SOLUTION | Freq: Two times a day (BID) | RESPIRATORY_TRACT | Status: DC
  Administered 2023-02-26 – 2023-02-27 (×2): 15 ug via RESPIRATORY_TRACT
  Filled 2023-02-26 (×2): qty 2

## 2023-02-26 MED ORDER — PANTOPRAZOLE SODIUM 40 MG PO TBEC
40.0000 mg | DELAYED_RELEASE_TABLET | Freq: Every day | ORAL | Status: DC
  Administered 2023-02-26: 40 mg via ORAL
  Filled 2023-02-26: qty 1

## 2023-02-26 NOTE — Progress Notes (Signed)
NAME:  [name], MRN:  [id number], DOB:  19-Jan-1961, LOS: 4 ADMISSION DATE:  02/21/2023, CONSULTATION DATE:  02/25/2023 REFERRING MD:  Dr. Reesa Chew - [facility], CHIEF COMPLAINT:  Wheezing    History of Present Illness:  [name] is a 62 y.o. female with a PMH significant for COPD, asthma, HTN. HLD, PVD, prior stroke, anxiety, and anemia who presented to the ED 02/21/23 for complaints of shortness of breath and weakness that began 2 days prior to admission. She required application of BIPAP on ED arrival. Workup on admission conisstent with acute exacerbation of COPD, she was admitted per [facility] and treated with supplemental oxygen, steroids, and bronchodilators.   Mid morning of of 3/11 patient continues to display waxing and waning respiratory status with intermittent wheezing assessed prompting PCCM consult.    Pertinent  Medical History  COPD, asthma, HTN. HLD, PVD, prior stroke, anxiety, and anemia  Significant Hospital Events: Including procedures, antibiotic start and stop dates in addition to other pertinent events   3/7 presented to ED for complaints of shortness of breath and weakness, admitted for acute exacerbation of COPD  3/11 PCCM consulted for ongoing intermittent wheezing   Interim History / Subjective:  As above   Objective   Blood pressure 116/74, pulse 77, temperature 97.9 F (36.6 C), temperature source Oral, resp. rate 18, height '5\' 3"'$  (1.6 m), weight 88.1 kg, SpO2 94 %.        Intake/Output Summary (Last 24 hours) at 02/26/2023 1109 Last data filed at 02/25/2023 1224 Gross per 24 hour  Intake 120 ml  Output --  Net 120 ml   Filed Weights   02/21/23 2058 02/22/23 1531  Weight: 87.5 kg 88.1 kg    Examination: General: Morbidly obese female in no acute distress HEENT: MM pink/moist no JVD is appreciated Neuro: Grossly intact without focal defect CV: Heart sounds are distant PULM: Mild expiratory wheez  GI: soft, bsx4 active  GU:  Voids Extremities: warm/dry, 1+ edema  Skin: no rashes or lesions   Resolved Hospital Problem list     Assessment & Plan:  Acute COPD exacerbation with history of COPD with asthma overlap > POA -Severe obstruction with a positive bronchodilator response confirmed on pulmonary function test Allergic rhinitis  -At baseline utilizes fluticasone nasal spray, Xyzal, Singulair, Dupixent  Laryngopharyngeal reflux  -On Pepcid and Protonix at baseline but continues to reports significant reflux  Chronic respiratory failure with hypoxia and hypercapnia  -Uses BIPAP at HS at baseline  Questionable component of upper airway irritation syndrome/vocal cord dysfunction -Wheezing waxes and wanes, appears to be exacerbated with physical activity  P: Noninvasive mechanical ventilatory support nightly Continue Pulmicort and DuoNebs She has a speech evaluation plan for today Continue Flonase SingulairDupixent  , Xyzal upon discharge May need ENT consult for assessment of vocal cord dysfunction\ She has a follow-up appoint with Dr. Lamonte Sakai scheduled for 03/05/2023 Pulmonary critical care will be available as needed   Best Practice (right click and "Reselect all SmartList Selections" daily)  Per primary   Labs   CBC: Recent Labs  Lab 02/21/23 2100 02/21/23 2126 02/22/23 0116 02/22/23 0525  WBC 12.1*  --  10.7* 6.2  NEUTROABS 7.5  --  9.9* 2.1  HGB 10.3* 9.9* 10.2* 12.6  HCT 31.6* 29.0* 31.2* 38.6  MCV 96.9  --  95.7 93.5  PLT 329  --  310 [id number]    Basic Metabolic Panel: Recent Labs  Lab 02/21/23 2100 02/21/23 2126 02/22/23 0116 02/22/23 0525  NA 141 142 141 139  K 3.6 3.4* 4.0 3.9  CL 105  --  107 107  CO2 27  --  25 22  GLUCOSE 171*  --  166* 181*  BUN 7  --  8 14  CREATININE 0.68  --  0.64 0.86  CALCIUM 8.8*  --  8.7* 9.3  MG 3.0*  --  2.3  --    GFR: Estimated Creatinine Clearance: 73.2 mL/min (by C-G formula based on SCr of 0.86 mg/dL). Recent Labs  Lab 02/21/23 2100  02/22/23 0116 02/22/23 0525  WBC 12.1* 10.7* 6.2    Liver Function Tests: Recent Labs  Lab 02/21/23 2100 02/24/23 0012  AST 24 22  ALT 21 22  ALKPHOS 80 70  BILITOT 0.3 0.4  PROT 5.7* 6.3*  ALBUMIN 3.3* 3.3*   No results for input(s): "LIPASE", "AMYLASE" in the last 168 hours. No results for input(s): "AMMONIA" in the last 168 hours.  ABG    Component Value Date/Time   PHART 7.359 06/13/2022 1044   PCO2ART 57.7 (H) 06/13/2022 1044   PO2ART 253 (H) 06/13/2022 1044   HCO3 28.1 (H) 02/21/2023 2126   TCO2 29 02/21/2023 2126   O2SAT 65 02/21/2023 2126     Coagulation Profile: No results for input(s): "INR", "PROTIME" in the last 168 hours.  Cardiac Enzymes: No results for input(s): "CKTOTAL", "CKMB", "CKMBINDEX", "TROPONINI" in the last 168 hours.  HbA1C: Hgb A1c MFr Bld  Date/Time Value Ref Range Status  09/13/2022 03:29 PM 6.5 4.6 - 6.5 % Final    Comment:    Glycemic Control Guidelines for People with Diabetes:Non Diabetic:  <6%Goal of Therapy: <7%Additional Action Suggested:  >8%   05/18/2022 04:03 PM 6.3 4.6 - 6.5 % Final    Comment:    Glycemic Control Guidelines for People with Diabetes:Non Diabetic:  <6%Goal of Therapy: <7%Additional Action Suggested:  >8%     CBG: Recent Labs  Lab 02/25/23 0632 02/25/23 1133 02/25/23 1706 02/25/23 2335 02/26/23 0837  GLUCAP 90 155* 124* 211* 203*     Critical care time: NA  Steve Marsalis Beaulieu ACNP Acute Care Nurse Practitioner [place] Please consult Amion 02/26/2023, 11:09 AM

## 2023-02-26 NOTE — Progress Notes (Signed)
PROGRESS NOTE    [name]  [id number] DOB: [birth year] DOA: 02/21/2023 PCP: Biagio Borg, MD   Brief Narrative:  61 year old female with past medical history of COPD, hypertension, bipolar, fibromyalgia.  Patient recently admitted in January with influenza, COPD exacerbation and respiratory failure.  Per patient since discharge she has not been well.  She has been back-and-forth between her pulmonologist and her PCP without improvement.  Per patient she remains quite dyspneic and short of breath.  Patient was diagnosed with COPD/asthma exacerbation.  Initially required BiPAP in the ED and later transitioned to nasal cannula and then room air.  Troponins, D-dimer was negative.  CT chest showed changes with emphysema.  BNP was normal as well.  Pulmonary team consulted.   Assessment & Plan:  Active Problems:   Chronic respiratory failure with hypoxia and hypercapnia (HCC)   Seizure disorder (HCC)   Essential (primary) hypertension   Fibromyalgia   Bipolar I disorder (HCC)   History of stroke   Acute exacerbation of COPD with asthma ([place])     Acute COPD exacerbation-acute asthma overlap syndrome -Patient seems to be improving.  Although on no longer hypoxic she gets quite dyspneic on exertion.  BNP, D-dimer negative.  Currently on steroids, bronchodilators.  I-S/flutter valve.  Daily Singulair. On empiric doxycycline.  Pulmonary team consulted.  Will discuss if patient will need inpatient bronchoscopy or defer the rest of the workup outpatient.   Essential hypertension  Controlled.  Continue current regimen-HCTZ, Avapro.  IV as needed   Hyperlipidemia  Atorvastatin.   GERD  Continue Protonix and Pepcid.   Bipolar disorder Continue Tegretol   Chronic headache  Resume Fioricet.   Dyspnea on minimal exertion: BNP and troponin are negative.  Echocardiogram overall unremarkable with preserved EF   Obesity: -Diet and exercise.   Liver cirrhosis: LFTs are  normal, acute hepatitis panel is negative.  Follow-up outpatient   DVT prophylaxis: Lovenox Code Status: Full code Family Communication:    Status is: Inpatient until breathing symptoms improved and she is able to ambulate without shortness of breath.    Subjective:  Feels a little better compared to yesterday but still having upper airway wheezing  Examination:  Constitutional: Not in acute distress Respiratory: Mild expiratory wheezing of upper airway Cardiovascular: Normal sinus rhythm, no rubs Abdomen: Nontender nondistended good bowel sounds Musculoskeletal: 1+ bilateral lower extremity pitting edema Skin: No rashes seen Neurologic: CN 2-12 grossly intact.  And nonfocal Psychiatric: Normal judgment and insight. Alert and oriented x 3. Normal mood.  Objective: Vitals:   02/25/23 2144 02/26/23 0452 02/26/23 0827 02/26/23 0832  BP:  137/79  116/74  Pulse:  64  77  Resp:  18  18  Temp:  98 F (36.7 C)  97.9 F (36.6 C)  TempSrc:  Oral  Oral  SpO2: 93% 95% 94% 94%  Weight:      Height:        Intake/Output Summary (Last 24 hours) at 02/26/2023 1136 Last data filed at 02/26/2023 0930 Gross per 24 hour  Intake 600 ml  Output --  Net 600 ml   Filed Weights   02/21/23 2058 02/22/23 1531  Weight: 87.5 kg 88.1 kg     Data Reviewed:   CBC: Recent Labs  Lab 02/21/23 2100 02/21/23 2126 02/22/23 0116 02/22/23 0525  WBC 12.1*  --  10.7* 6.2  NEUTROABS 7.5  --  9.9* 2.1  HGB 10.3* 9.9* 10.2* 12.6  HCT 31.6* 29.0* 31.2* 38.6  MCV 96.9  --  95.7 93.5  PLT 329  --  310 [id number]   Basic Metabolic Panel: Recent Labs  Lab 02/21/23 2100 02/21/23 2126 02/22/23 0116 02/22/23 0525  NA 141 142 141 139  K 3.6 3.4* 4.0 3.9  CL 105  --  107 107  CO2 27  --  25 22  GLUCOSE 171*  --  166* 181*  BUN 7  --  8 14  CREATININE 0.68  --  0.64 0.86  CALCIUM 8.8*  --  8.7* 9.3  MG 3.0*  --  2.3  --    GFR: Estimated Creatinine Clearance: 73.2 mL/min (by C-G formula based  on SCr of 0.86 mg/dL). Liver Function Tests: Recent Labs  Lab 02/21/23 2100 02/24/23 0012  AST 24 22  ALT 21 22  ALKPHOS 80 70  BILITOT 0.3 0.4  PROT 5.7* 6.3*  ALBUMIN 3.3* 3.3*   No results for input(s): "LIPASE", "AMYLASE" in the last 168 hours. No results for input(s): "AMMONIA" in the last 168 hours. Coagulation Profile: No results for input(s): "INR", "PROTIME" in the last 168 hours. Cardiac Enzymes: No results for input(s): "CKTOTAL", "CKMB", "CKMBINDEX", "TROPONINI" in the last 168 hours. BNP (last 3 results) No results for input(s): "PROBNP" in the last 8760 hours. HbA1C: No results for input(s): "HGBA1C" in the last 72 hours. CBG: Recent Labs  Lab 02/25/23 1133 02/25/23 1706 02/25/23 2335 02/26/23 0837 02/26/23 1123  GLUCAP 155* 124* 211* 203* 148*   Lipid Profile: No results for input(s): "CHOL", "HDL", "LDLCALC", "TRIG", "CHOLHDL", "LDLDIRECT" in the last 72 hours. Thyroid Function Tests: No results for input(s): "TSH", "T4TOTAL", "FREET4", "T3FREE", "THYROIDAB" in the last 72 hours. Anemia Panel: No results for input(s): "VITAMINB12", "FOLATE", "FERRITIN", "TIBC", "IRON", "RETICCTPCT" in the last 72 hours. Sepsis Labs: No results for input(s): "PROCALCITON", "LATICACIDVEN" in the last 168 hours.  Recent Results (from the past 240 hour(s))  Resp panel by RT-PCR (RSV, Flu A&B, Covid) Anterior Nasal Swab     Status: None   Collection Time: 02/21/23 11:19 PM   Specimen: Anterior Nasal Swab  Result Value Ref Range Status   SARS Coronavirus 2 by RT PCR NEGATIVE NEGATIVE Final   Influenza A by PCR NEGATIVE NEGATIVE Final   Influenza B by PCR NEGATIVE NEGATIVE Final    Comment: (NOTE) The Xpert Xpress SARS-CoV-2/FLU/RSV plus assay is intended as an aid in the diagnosis of influenza from Nasopharyngeal swab specimens and should not be used as a sole basis for treatment. Nasal washings and aspirates are unacceptable for Xpert Xpress  SARS-CoV-2/FLU/RSV testing.  Fact Sheet for Patients: EntrepreneurPulse.com.au  Fact Sheet for Healthcare Providers: IncredibleEmployment.be  This test is not yet approved or cleared by the [place] FDA and has been authorized for detection and/or diagnosis of SARS-CoV-2 by FDA under an Emergency Use Authorization (EUA). This EUA will remain in effect (meaning this test can be used) for the duration of the COVID-19 declaration under Section 564(b)(1) of the Act, 21 U.S.C. section 360bbb-3(b)(1), unless the authorization is terminated or revoked.     Resp Syncytial Virus by PCR NEGATIVE NEGATIVE Final    Comment: (NOTE) Fact Sheet for Patients: EntrepreneurPulse.com.au  Fact Sheet for Healthcare Providers: IncredibleEmployment.be  This test is not yet approved or cleared by the [place] FDA and has been authorized for detection and/or diagnosis of SARS-CoV-2 by FDA under an Emergency Use Authorization (EUA). This EUA will remain in effect (meaning this test can be used) for the duration of the COVID-19 declaration under Section 564(b)(1)  of the Act, 21 U.S.C. section 360bbb-3(b)(1), unless the authorization is terminated or revoked.  Performed at [facility], [place] [address], [place], [place] [address]   MRSA Next Gen by PCR, Nasal     Status: None   Collection Time: 02/22/23  3:33 PM   Specimen: Nasal Mucosa; Nasal Swab  Result Value Ref Range Status   MRSA by PCR Next Gen NOT DETECTED NOT DETECTED Final    Comment: (NOTE) The GeneXpert MRSA Assay (FDA approved for NASAL specimens only), is one component of a comprehensive MRSA colonization surveillance program. It is not intended to diagnose MRSA infection nor to guide or monitor treatment for MRSA infections. Test performance is not FDA approved in patients less than 27 years old. Performed at [facility], [place] [address] [place], [place] [address]          Radiology Studies: ECHOCARDIOGRAM COMPLETE  Result Date: 02/24/2023    ECHOCARDIOGRAM REPORT   Patient Name:   [name] Date of Exam: 02/24/2023 Medical Rec #:  [id number]            Height:       63.0 in Accession #:    [id number]           Weight:       194.2 lb Date of Birth:  04/12/1961             BSA:          1.910 m Patient Age:    59 years             BP:           127/76 mmHg Patient Gender: F                    HR:           84 bpm. Exam Location:  Inpatient Procedure: 2D Echo, Cardiac Doppler and Color Doppler Indications:    CHF-Acute Diastolic [id number]  History:        Patient has prior history of Echocardiogram examinations, most                 recent 06/10/2020. Stroke, Signs/Symptoms:Shortness of Breath;                 Risk Factors:Former Smoker, Hypertension and Dyslipidemia.  Sonographer:    Greer Pickerel Referring Phys: [id number] SYLVESTER I OGBATA  Sonographer Comments: Suboptimal parasternal window. Image acquisition challenging due to patient body habitus and Image acquisition challenging due to respiratory motion. IMPRESSIONS  1. Left ventricular ejection fraction, by estimation, is 60 to 65%. The left ventricle has normal function. The left ventricle has no regional wall motion abnormalities. Left ventricular diastolic parameters were normal.  2. Right ventricular systolic function is normal. The right ventricular size is normal. There is normal pulmonary artery systolic pressure.  3. The mitral valve is normal in structure. No evidence of mitral valve regurgitation. No evidence of mitral stenosis.  4. The aortic valve has an indeterminant number of cusps. There is mild calcification of the aortic valve. There is mild thickening of the aortic valve. Aortic valve regurgitation is not visualized. No aortic stenosis is present.  5. The inferior vena cava is normal in size with greater than 50% respiratory variability, suggesting right atrial  pressure of 3 mmHg. FINDINGS  Left Ventricle: Left ventricular ejection fraction, by estimation, is 60 to 65%. The left ventricle has normal function. The left ventricle  has no regional wall motion abnormalities. The left ventricular internal cavity size was normal in size. There is  no left ventricular hypertrophy. Left ventricular diastolic parameters were normal. Right Ventricle: The right ventricular size is normal. Right vetricular wall thickness was not well visualized. Right ventricular systolic function is normal. There is normal pulmonary artery systolic pressure. The tricuspid regurgitant velocity is 1.15 m/s, and with an assumed right atrial pressure of 3 mmHg, the estimated right ventricular systolic pressure is 8.3 mmHg. Left Atrium: Left atrial size was normal in size. Right Atrium: Right atrial size was normal in size. Pericardium: There is no evidence of pericardial effusion. Mitral Valve: The mitral valve is normal in structure. No evidence of mitral valve regurgitation. No evidence of mitral valve stenosis. Tricuspid Valve: The tricuspid valve is normal in structure. Tricuspid valve regurgitation is not demonstrated. No evidence of tricuspid stenosis. Aortic Valve: The aortic valve has an indeterminant number of cusps. There is mild calcification of the aortic valve. There is mild thickening of the aortic valve. There is mild aortic valve annular calcification. Aortic valve regurgitation is not visualized. No aortic stenosis is present. Aortic valve mean gradient measures 5.0 mmHg. Aortic valve peak gradient measures 9.8 mmHg. Aortic valve area, by VTI measures 1.71 cm. Pulmonic Valve: The pulmonic valve was not well visualized. Pulmonic valve regurgitation is not visualized. No evidence of pulmonic stenosis. Aorta: The aortic root and ascending aorta are structurally normal, with no evidence of dilitation. Venous: The inferior vena cava is normal in size with greater than 50% respiratory  variability, suggesting right atrial pressure of 3 mmHg. IAS/Shunts: No atrial level shunt detected by color flow Doppler.  LEFT VENTRICLE PLAX 2D LVIDd:         4.60 cm   Diastology LVIDs:         3.10 cm   LV e' medial:    7.72 cm/s LV PW:         1.00 cm   LV E/e' medial:  11.9 LV IVS:        0.90 cm   LV e' lateral:   11.10 cm/s LVOT diam:     1.90 cm   LV E/e' lateral: 8.3 LV SV:         49 LV SV Index:   26 LVOT Area:     2.84 cm  RIGHT VENTRICLE RV S prime:     11.90 cm/s TAPSE (M-mode): 2.1 cm LEFT ATRIUM             Index        RIGHT ATRIUM           Index LA diam:        3.40 cm 1.78 cm/m   RA Area:     11.50 cm LA Vol (A2C):   50.7 ml 26.55 ml/m  RA Volume:   27.60 ml  14.45 ml/m LA Vol (A4C):   43.2 ml 22.62 ml/m LA Biplane Vol: 47.1 ml 24.66 ml/m  AORTIC VALVE AV Area (Vmax):    1.72 cm AV Area (Vmean):   1.67 cm AV Area (VTI):     1.71 cm AV Vmax:           156.85 cm/s AV Vmean:          106.875 cm/s AV VTI:            0.288 m AV Peak Grad:      9.8 mmHg AV Mean Grad:  5.0 mmHg LVOT Vmax:         95.40 cm/s LVOT Vmean:        62.900 cm/s LVOT VTI:          0.174 m LVOT/AV VTI ratio: 0.60  AORTA Ao Root diam: 3.60 cm Ao Asc diam:  3.40 cm MITRAL VALVE               TRICUSPID VALVE MV Area (PHT): 4.17 cm    TR Peak grad:   5.3 mmHg MV Decel Time: 182 msec    TR Vmax:        115.00 cm/s MV E velocity: 92.20 cm/s MV A velocity: 82.30 cm/s  SHUNTS MV E/A ratio:  1.12        Systemic VTI:  0.17 m                            Systemic Diam: 1.90 cm Carlyle Dolly MD Electronically signed by Carlyle Dolly MD Signature Date/Time: 02/24/2023/1:51:12 PM    Final         Scheduled Meds:  atorvastatin  40 mg Oral QHS   budesonide (PULMICORT) nebulizer solution  0.5 mg Nebulization BID   carbamazepine  400 mg Oral Daily   And   carbamazepine  600 mg Oral QHS   cholecalciferol  5,000 Units Oral Daily   dextromethorphan-guaiFENesin  1 tablet Oral BID   doxycycline  100 mg Oral Q12H    enoxaparin (LOVENOX) injection  40 mg Subcutaneous Q24H   famotidine  40 mg Oral QHS   ferrous sulfate  325 mg Oral Q breakfast   gabapentin  100 mg Oral TID   hydrochlorothiazide  12.5 mg Oral Daily   insulin aspart  0-15 Units Subcutaneous TID WC   insulin aspart  0-5 Units Subcutaneous QHS   irbesartan  75 mg Oral Daily   ketotifen  1 drop Both Eyes BID   montelukast  10 mg Oral QHS   [START ON 02/27/2023] predniSONE  40 mg Oral Q breakfast   Continuous Infusions:   LOS: 4 days   Time spent= 35 mins    Crimson Dubberly Arsenio Loader, MD [facility]  If 7PM-7AM, please contact night-coverage  02/26/2023, 11:36 AM

## 2023-02-26 NOTE — Evaluation (Signed)
Clinical/Bedside Swallow Evaluation Patient Details  Name: [name] MRN: [id number] Date of Birth: [birth year]  Today's Date: 02/26/2023 Time: SLP Start Time (ACUTE ONLY): 1205 SLP Stop Time (ACUTE ONLY): 1220 SLP Time Calculation (min) (ACUTE ONLY): 15 min  Past Medical History:  Past Medical History:  Diagnosis Date   Anemia, iron deficiency 12/22/2014   pt. 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      Bipolar disorder ([place])    with anxiety, depression   COPD (chronic obstructive pulmonary disease) ([place])    Eczema 05/18/2022   Fibromyalgia 05/14/2014   GERD (gastroesophageal reflux disease)    History of kidney stones    Hyperlipidemia 04/20/2017   HYPERTENSION 05/12/2009   Qualifier: Diagnosis of  By: Lenn Cal Deborra Medina), [name]     Peripheral vascular disease ([place])    Pneumonia    Prediabetes 02/23/2014   pt. denies   Seizure ([place])    Stroke ([place]) 11/2020   Urticaria    Past Surgical History:  Past Surgical History:  Procedure Laterality Date   ABDOMINAL HYSTERECTOMY     ANTERIOR CERVICAL DECOMP/DISCECTOMY FUSION N/A 07/28/2020   Procedure: ANTERIOR CERVICAL DECOMPRESSION/DISCECTOMY FUSION. INTERBODY PROTHESIS, PLATE/SCREWS CERVICAL THREE- CERVICAL FOUR, CERVICAL FOUR- CERVICAL FIVE;  Surgeon: Newman Pies, MD;  Location: [facility];  Service: Neurosurgery;  Laterality: N/A;   BACK SURGERY     COLONOSCOPY  12/20/2011   Procedure: COLONOSCOPY;  Surgeon: Landry Dyke, MD;  Location: [facility];  Service: Endoscopy;  Laterality: N/A;   COLONOSCOPY  03/05/2012   Procedure: COLONOSCOPY;  Surgeon: Landry Dyke, MD;  Location: [facility];  Service: Endoscopy;  Laterality: N/A;   DIAGNOSTIC LAPAROSCOPY     HEMORRHOID SURGERY     INCISE AND DRAIN ABCESS     KIDNEY  STONE SURGERY     NECK SURGERY     x 2 Dr Orinda Kenner   SPINE SURGERY  2019   TOE SURGERY     TUBAL LIGATION     HPI:  Patient is a 62 y.o. female with PMH: COPD, GERD, bipolar, fibromyalgia,stroke, recent admission for flu, esophageal dilation in 2022. She has been back and forth with pulmonologist and her PCP without improvement and ultimately presented to the hospital on 02/22/23 with COPD exacerbation and respiratory failure. In ED she was initially on BiPAP and transitioned to nasal cannula and then room air. CT chest showed changes with emphysema.    Assessment / Plan / Recommendation  Clinical Impression  Patient not currently presenting with clinical s/s of an oral or pharyngeal phase dysphagia but as per her report and medical history, she is presenting with an esophageal dysphagia and per patient, she may have had some exacerbation of her symptoms. She reported that when she had esophageal dilation a few years ago it did not help and she thinks it made her swallow worse. She reports adhering to strict reflux precautions, taking PPI, etc. She did endorse an episode of regurgitation of PO's on Thursday (02/21/23) but by 02/23/23 she was able to resume PO intake. SLP recommending patient continue with reflux precautions and for her to f/u with her PCP regarding her GERD. SLP Visit Diagnosis: Dysphagia, unspecified (R13.10)    Aspiration Risk  No limitations    Diet Recommendation Regular;Thin liquid   Liquid Administration via:  Cup;Straw Medication Administration: Whole meds with liquid Supervision: Patient able to self feed Postural Changes: Seated upright at 90 degrees;Remain upright for at least 30 minutes after po intake    Other  Recommendations Oral Care Recommendations: Oral care BID    Recommendations for follow up therapy are one component of a multi-disciplinary discharge planning process, led by the attending physician.  Recommendations may be updated based on patient status,  additional functional criteria and insurance authorization.  Follow up Recommendations No SLP follow up      Assistance Recommended at Discharge    Functional Status Assessment Patient has not had a recent decline in their functional status  Frequency and Duration     N/A       Prognosis   N/A     Swallow Study   General HPI: Patient is a 62 y.o. female with PMH: COPD, GERD, bipolar, fibromyalgia,stroke, recent admission for flu, esophageal dilation in 2022. She has been back and forth with pulmonologist and her PCP without improvement and ultimately presented to the hospital on 02/22/23 with COPD exacerbation and respiratory failure. In ED she was initially on BiPAP and transitioned to nasal cannula and then room air. CT chest showed changes with emphysema. Type of Study: Bedside Swallow Evaluation Previous Swallow Assessment: none found Diet Prior to this Study: Regular;Thin liquids (Level 0) Temperature Spikes Noted: No Respiratory Status: Room air History of Recent Intubation: No Behavior/Cognition: Alert;Cooperative;Pleasant mood Oral Cavity Assessment: Within Functional Limits Oral Care Completed by SLP: No Oral Cavity - Dentition: Adequate natural dentition Vision: Functional for self-feeding Self-Feeding Abilities: Able to feed self Patient Positioning: Upright in bed Baseline Vocal Quality: Normal;Other (comment) (mildly harsh/hoarse) Volitional Cough: Strong Volitional Swallow: Able to elicit    Oral/Motor/Sensory Function Overall Oral Motor/Sensory Function: Within functional limits   Ice Chips     Thin Liquid Thin Liquid: Within functional limits Presentation: Straw;Self Fed    Nectar Thick     Honey Thick     Puree Puree: Not tested   Solid     Solid: Not tested      Sonia Baller, MA, CCC-SLP Speech Therapy

## 2023-02-27 ENCOUNTER — Other Ambulatory Visit (HOSPITAL_COMMUNITY): Payer: Self-pay

## 2023-02-27 DIAGNOSIS — J441 Chronic obstructive pulmonary disease with (acute) exacerbation: Secondary | ICD-10-CM | POA: Diagnosis not present

## 2023-02-27 DIAGNOSIS — F319 Bipolar disorder, unspecified: Secondary | ICD-10-CM | POA: Diagnosis not present

## 2023-02-27 DIAGNOSIS — J45901 Unspecified asthma with (acute) exacerbation: Secondary | ICD-10-CM | POA: Diagnosis not present

## 2023-02-27 DIAGNOSIS — I10 Essential (primary) hypertension: Secondary | ICD-10-CM | POA: Diagnosis not present

## 2023-02-27 LAB — BASIC METABOLIC PANEL
Anion gap: 11 (ref 5–15)
BUN: 16 mg/dL (ref 6–20)
CO2: 25 mmol/L (ref 22–32)
Calcium: 8.8 mg/dL — ABNORMAL LOW (ref 8.9–10.3)
Chloride: 100 mmol/L (ref 98–111)
Creatinine, Ser: 0.72 mg/dL (ref 0.44–1.00)
GFR, Estimated: >60 mL/min (ref >60)
Glucose, Bld: 107 mg/dL — ABNORMAL HIGH (ref 70–99)
Potassium: 3.8 mmol/L (ref 3.5–5.1)
Sodium: 136 mmol/L (ref 135–145)

## 2023-02-27 LAB — GLUCOSE, CAPILLARY: Glucose-Capillary: 105 mg/dL — ABNORMAL HIGH (ref 70–99)

## 2023-02-27 LAB — MAGNESIUM: Magnesium: 1.7 mg/dL (ref 1.7–2.4)

## 2023-02-27 MED ORDER — PREDNISONE 10 MG PO TABS
ORAL_TABLET | ORAL | 0 refills | Status: DC
  Filled 2023-02-27: qty 10, 4d supply, fill #0

## 2023-02-27 NOTE — Discharge Summary (Signed)
PATIENT DETAILS Name: [name] Age: 62 y.o. Sex: female Date of Birth: October 10, 1961 MRN: [id number]. Admitting Physician: Quintella Baton, MD [id number], [name], MD  Admit Date: 02/21/2023 Discharge date: 02/27/2023  Recommendations for Outpatient Follow-up:  Follow up with PCP in 1-2 weeks Please obtain CMP/CBC in one week Please ensure follow up with pulmonology. Cirrhotic appearing liver on CT chest-incidental finding-defer further workup to PCP.  Admitted From:  Home  Disposition: Home   Discharge Condition: good  CODE STATUS:   Code Status: Full Code   Diet recommendation:  Diet Order             Diet - low sodium heart healthy           Diet regular Room service appropriate? Yes; Fluid consistency: Thin  Diet effective now                    Brief Summary: 62 year old with history of COPD, HTN, bipolar disorder, fibromyalgia who was admitted with shortness of breath-found to have COPD exacerbation and subsequently admitted to the hospitalist service.  Brief Hospital Course: Acute COPD exacerbation-acute asthma overlap syndrome Much better-this morning she claims she is back to her baseline after being treated with systemic steroids/bronchodilators and empiric antibiotics. Although thought to have COPD/acute asthma overlap causing exacerbation-PCCM suspecting she has some contribution from vocal cord dysfunction given upper airway wheezing on exam.  She is already on PPI twice daily and Pepcid daily. She is to resume her usual bronchodilator regimen on discharge PCCM signed off on 3/12-recommendations are for follow-up in the clinic on 3/19-and decide on whether she needs a ENT referral or bronchoscopy.   Essential hypertension  Controlled.   Continue current regimen-HCTZ, Avapro.    Hyperlipidemia  Atorvastatin.   GERD  Continue Protonix and Pepcid.   Bipolar disorder Continue Tegretol   Chronic headache  Resume Fioricet.    Dyspnea on minimal exertion: BNP and troponin are negative.  Echocardiogram overall unremarkable with preserved EF This morning she is ambulating in the room and feels that she is back to her baseline.   Obesity: Diet and exercise.   Liver cirrhosis: Cirrhotic morphology liver seen incidentally on CT chest LFTs are normal, acute hepatitis panel is negative.  Follow as an outpatient with PCP for further care-as this is mostly an incidental finding on imaging    Estimated body mass index is 34.41 kg/m as calculated from the following:   Height as of this encounter: '5\' 3"'$  (1.6 m).   Weight as of this encounter: 88.1 kg.    Discharge Diagnoses:  Active Problems:   Chronic respiratory failure with hypoxia and hypercapnia (HCC)   Seizure disorder (HCC)   Essential (primary) hypertension   Fibromyalgia   Bipolar I disorder (HCC)   History of stroke   Acute exacerbation of COPD with asthma [facility])   Discharge Instructions:  Activity:  As tolerated with Full fall precautions use walker/cane & assistance as needed  Discharge Instructions     Ambulatory referral to Speech Therapy   Complete by: As directed    Diet - low sodium heart healthy   Complete by: As directed    Discharge instructions   Complete by: As directed    Follow with Primary MD  Biagio Borg, MD in 1-2 weeks  Please get a complete blood count and chemistry panel checked by your Primary MD at your next visit, and again as instructed by your Primary MD.  Get Medicines reviewed  and adjusted: Please take all your medications with you for your next visit with your Primary MD  Laboratory/radiological data: Please request your Primary MD to go over all hospital tests and procedure/radiological results at the follow up, please ask your Primary MD to get all Hospital records sent to his/her office.  In some cases, they will be blood work, cultures and biopsy results pending at the time of your discharge. Please  request that your primary care M.D. follows up on these results.  Also Note the following: If you experience worsening of your admission symptoms, develop shortness of breath, life threatening emergency, suicidal or homicidal thoughts you must seek medical attention immediately by calling 911 or calling your MD immediately  if symptoms less severe.  You must read complete instructions/literature along with all the possible adverse reactions/side effects for all the Medicines you take and that have been prescribed to you. Take any new Medicines after you have completely understood and accpet all the possible adverse reactions/side effects.   Do not drive when taking Pain medications or sleeping medications (Benzodaizepines)  Do not take more than prescribed Pain, Sleep and Anxiety Medications. It is not advisable to combine anxiety,sleep and pain medications without talking with your primary care practitioner  Special Instructions: If you have smoked or chewed Tobacco  in the last 2 yrs please stop smoking, stop any regular Alcohol  and or any Recreational drug use.  Wear Seat belts while driving.  Please note: You were cared for by a hospitalist during your hospital stay. Once you are discharged, your primary care physician will handle any further medical issues. Please note that NO REFILLS for any discharge medications will be authorized once you are discharged, as it is imperative that you return to your primary care physician (or establish a relationship with a primary care physician if you do not have one) for your post hospital discharge needs so that they can reassess your need for medications and monitor your lab values.   Increase activity slowly   Complete by: As directed       Allergies as of 02/27/2023       Reactions   Sulfa Antibiotics Nausea And Vomiting, Other (See Comments)   Venofer [iron Sucrose] Hives        Medication List     TAKE these medications    albuterol  108 (90 Base) MCG/ACT inhaler Commonly known as: Ventolin HFA Inhale 2 puffs into the lungs every 6 (six) hours as needed for wheezing or shortness of breath.   atorvastatin 40 MG tablet Commonly known as: LIPITOR Take 1 tablet (40 mg total) by mouth daily. TAKE 1 TABLET(40 MG) BY MOUTH DAILY Strength: 40 mg What changed: additional instructions   azelastine 0.05 % ophthalmic solution Commonly known as: OPTIVAR Place 1 drop into both eyes 2 (two) times daily.   Belsomra 15 MG Tabs Generic drug: Suvorexant Take 15 mg by mouth at bedtime as needed (sleep).   Breztri Aerosphere 160-9-4.8 MCG/ACT Aero Generic drug: Budeson-Glycopyrrol-Formoterol Inhale 2 puffs into the lungs in the morning and at bedtime.   butalbital-acetaminophen-caffeine 50-325-40 MG tablet Commonly known as: FIORICET 1 tab by mouth once daily as needed for HA, limit 14 per month   carbamazepine 200 MG tablet Commonly known as: TEGretol Take 2-3 tablets (400-600 mg total) by mouth See admin instructions. Take '400mg'$  in the morning and '600mg'$  at bedtime.   cetirizine 10 MG tablet Commonly known as: ZYRTEC Can take one tablet by mouth once  daily if needed for runny nose. What changed:  how much to take how to take this when to take this reasons to take this additional instructions   cyclobenzaprine 5 MG tablet Commonly known as: FLEXERIL Take 5 mg by mouth 3 (three) times daily as needed for muscle spasms.   eucerin cream Apply 1 Application topically daily as needed for dry skin.   famotidine 40 MG tablet Commonly known as: PEPCID Take 1 tablet (40 mg total) by mouth at bedtime.   ferrous sulfate 325 (65 FE) MG EC tablet Take 1 tablet (325 mg total) by mouth daily with breakfast.   fluticasone 50 MCG/ACT nasal spray Commonly known as: FLONASE Place 1 spray into both nostrils 2 (two) times daily as needed for allergies or rhinitis.   gabapentin 100 MG capsule Commonly known as: NEURONTIN Take 1  capsule (100 mg total) by mouth 3 (three) times daily.   hydrochlorothiazide 12.5 MG capsule Commonly known as: MICROZIDE Take 1 capsule (12.5 mg total) by mouth daily. TAKE 1 CAPSULE(12.5 MG) BY MOUTH DAILY Strength: 12.5 mg What changed: additional instructions   hydrOXYzine 25 MG capsule Commonly known as: VISTARIL Take 1 capsule (25 mg total) by mouth every 6 (six) hours as needed for anxiety.   ipratropium-albuterol 0.5-2.5 (3) MG/3ML Soln Commonly known as: DUONEB Take 3 mLs by nebulization 2 (two) times daily.   irbesartan 75 MG tablet Commonly known as: AVAPRO TAKE 1 TABLET(75 MG) BY MOUTH DAILY What changed: See the new instructions.   montelukast 10 MG tablet Commonly known as: SINGULAIR Take 1 tablet (10 mg total) by mouth at bedtime.   ondansetron 4 MG disintegrating tablet Commonly known as: Zofran ODT Take 1 tablet (4 mg total) by mouth every 8 (eight) hours as needed for nausea or vomiting.   pantoprazole 40 MG tablet Commonly known as: Protonix Take 1 tablet (40 mg total) by mouth 2 (two) times daily.   potassium chloride 10 MEQ tablet Commonly known as: KLOR-CON M Take 1 tablet (10 mEq total) by mouth daily.   predniSONE 10 MG tablet Commonly known as: DELTASONE Take 40 mg daily for 1 day, 30 mg daily for 1 day, 20 mg daily for 1 days,10 mg daily for 1 day, then stop   tacrolimus 0.03 % ointment Commonly known as: Protopic Apply topically 2 (two) times daily. What changed:  how much to take when to take this reasons to take this   triamcinolone ointment 0.1 % Commonly known as: KENALOG Apply 1 Application topically 2 (two) times daily. What changed: when to take this   Vitamin D 125 MCG (5000 UT) Caps Take 5,000 Units by mouth daily.   Yupelri 175 MCG/3ML nebulizer solution Generic drug: revefenacin Take 3 mLs (175 mcg total) by nebulization daily.        Follow-up [facility] Follow up.    Specialty: Rehabilitation Why: Call to schedule appointment for speech therapy Contact information: [address] [place]        Biagio Borg, MD. Schedule an appointment as soon as possible for a visit in 1 week(s).   Specialties: Internal Medicine, Radiology Contact information: [place] [address] [phone]         Collene Gobble, MD Follow up on 03/05/2023.   Specialty: Pulmonary Disease Why: appt at 3:30 pm Contact information: [place] [address] [phone]  Allergies  Allergen Reactions   Sulfa Antibiotics Nausea And Vomiting and Other (See Comments)   Venofer [Iron Sucrose] Hives     Other Procedures/Studies: ECHOCARDIOGRAM COMPLETE  Result Date: 02/24/2023    ECHOCARDIOGRAM REPORT   Patient Name:   KERSTAN BUELOW Date of Exam: 02/24/2023 Medical Rec #:  [id number]            Height:       63.0 in Accession #:    [id number]           Weight:       194.2 lb Date of Birth:  Feb 16, 1961             BSA:          1.910 m Patient Age:    31 years             BP:           127/76 mmHg Patient Gender: F                    HR:           84 bpm. Exam Location:  Inpatient Procedure: 2D Echo, Cardiac Doppler and Color Doppler Indications:    CHF-Acute Diastolic [id number]  History:        Patient has prior history of Echocardiogram examinations, most                 recent 06/10/2020. Stroke, Signs/Symptoms:Shortness of Breath;                 Risk Factors:Former Smoker, Hypertension and Dyslipidemia.  Sonographer:    Greer Pickerel Referring Phys: [id number] SYLVESTER I OGBATA  Sonographer Comments: Suboptimal parasternal window. Image acquisition challenging due to patient body habitus and Image acquisition challenging due to respiratory motion. IMPRESSIONS  1. Left ventricular ejection fraction, by estimation, is 60 to 65%. The left ventricle has  normal function. The left ventricle has no regional wall motion abnormalities. Left ventricular diastolic parameters were normal.  2. Right ventricular systolic function is normal. The right ventricular size is normal. There is normal pulmonary artery systolic pressure.  3. The mitral valve is normal in structure. No evidence of mitral valve regurgitation. No evidence of mitral stenosis.  4. The aortic valve has an indeterminant number of cusps. There is mild calcification of the aortic valve. There is mild thickening of the aortic valve. Aortic valve regurgitation is not visualized. No aortic stenosis is present.  5. The inferior vena cava is normal in size with greater than 50% respiratory variability, suggesting right atrial pressure of 3 mmHg. FINDINGS  Left Ventricle: Left ventricular ejection fraction, by estimation, is 60 to 65%. The left ventricle has normal function. The left ventricle has no regional wall motion abnormalities. The left ventricular internal cavity size was normal in size. There is  no left ventricular hypertrophy. Left ventricular diastolic parameters were normal. Right Ventricle: The right ventricular size is normal. Right vetricular wall thickness was not well visualized. Right ventricular systolic function is normal. There is normal pulmonary artery systolic pressure. The tricuspid regurgitant velocity is 1.15 m/s, and with an assumed right atrial pressure of 3 mmHg, the estimated right ventricular systolic pressure is 8.3 mmHg. Left Atrium: Left atrial size was normal in size. Right Atrium: Right atrial size was normal in size. Pericardium: There is no evidence of pericardial effusion. Mitral Valve: The mitral valve is normal in structure. No evidence of mitral valve regurgitation. No evidence of  mitral valve stenosis. Tricuspid Valve: The tricuspid valve is normal in structure. Tricuspid valve regurgitation is not demonstrated. No evidence of tricuspid stenosis. Aortic Valve: The  aortic valve has an indeterminant number of cusps. There is mild calcification of the aortic valve. There is mild thickening of the aortic valve. There is mild aortic valve annular calcification. Aortic valve regurgitation is not visualized. No aortic stenosis is present. Aortic valve mean gradient measures 5.0 mmHg. Aortic valve peak gradient measures 9.8 mmHg. Aortic valve area, by VTI measures 1.71 cm. Pulmonic Valve: The pulmonic valve was not well visualized. Pulmonic valve regurgitation is not visualized. No evidence of pulmonic stenosis. Aorta: The aortic root and ascending aorta are structurally normal, with no evidence of dilitation. Venous: The inferior vena cava is normal in size with greater than 50% respiratory variability, suggesting right atrial pressure of 3 mmHg. IAS/Shunts: No atrial level shunt detected by color flow Doppler.  LEFT VENTRICLE PLAX 2D LVIDd:         4.60 cm   Diastology LVIDs:         3.10 cm   LV e' medial:    7.72 cm/s LV PW:         1.00 cm   LV E/e' medial:  11.9 LV IVS:        0.90 cm   LV e' lateral:   11.10 cm/s LVOT diam:     1.90 cm   LV E/e' lateral: 8.3 LV SV:         49 LV SV Index:   26 LVOT Area:     2.84 cm  RIGHT VENTRICLE RV S prime:     11.90 cm/s TAPSE (M-mode): 2.1 cm LEFT ATRIUM             Index        RIGHT ATRIUM           Index LA diam:        3.40 cm 1.78 cm/m   RA Area:     11.50 cm LA Vol (A2C):   50.7 ml 26.55 ml/m  RA Volume:   27.60 ml  14.45 ml/m LA Vol (A4C):   43.2 ml 22.62 ml/m LA Biplane Vol: 47.1 ml 24.66 ml/m  AORTIC VALVE AV Area (Vmax):    1.72 cm AV Area (Vmean):   1.67 cm AV Area (VTI):     1.71 cm AV Vmax:           156.85 cm/s AV Vmean:          106.875 cm/s AV VTI:            0.288 m AV Peak Grad:      9.8 mmHg AV Mean Grad:      5.0 mmHg LVOT Vmax:         95.40 cm/s LVOT Vmean:        62.900 cm/s LVOT VTI:          0.174 m LVOT/AV VTI ratio: 0.60  AORTA Ao Root diam: 3.60 cm Ao Asc diam:  3.40 cm MITRAL VALVE                TRICUSPID VALVE MV Area (PHT): 4.17 cm    TR Peak grad:   5.3 mmHg MV Decel Time: 182 msec    TR Vmax:        115.00 cm/s MV E velocity: 92.20 cm/s MV A velocity: 82.30 cm/s  SHUNTS MV E/A ratio:  1.12  Systemic VTI:  0.17 m                            Systemic Diam: 1.90 cm Carlyle Dolly MD Electronically signed by Carlyle Dolly MD Signature Date/Time: 02/24/2023/1:51:12 PM    Final    CT CHEST WO CONTRAST  Result Date: 02/22/2023 CLINICAL DATA:  62 year old female with history of hypoxia and dyspnea. EXAM: CT CHEST WITHOUT CONTRAST TECHNIQUE: Multidetector CT imaging of the chest was performed following the standard protocol without IV contrast. RADIATION DOSE REDUCTION: This exam was performed according to the departmental dose-optimization program which includes automated exposure control, adjustment of the mA and/or kV according to patient size and/or use of iterative reconstruction technique. COMPARISON:  Chest CTA 10/08/2022. FINDINGS: Cardiovascular: Heart size is normal. There is no significant pericardial fluid, thickening or pericardial calcification. There is aortic atherosclerosis, as well as atherosclerosis of the great vessels of the mediastinum and the coronary arteries, including calcified atherosclerotic plaque in the right coronary artery. Mediastinum/Nodes: No pathologically enlarged mediastinal or hilar lymph nodes. Please note that accurate exclusion of hilar adenopathy is limited on noncontrast CT scans. Esophagus is unremarkable in appearance. No axillary lymphadenopathy. Lungs/Pleura: No acute consolidative airspace disease. No pleural effusions. No suspicious appearing pulmonary nodules or masses are noted. Diffuse bronchial wall thickening with mild to moderate centrilobular and paraseptal emphysema. Upper Abdomen: Atherosclerosis in the abdominal aorta. Visualized liver has a slightly nodular contour, suggesting underlying cirrhosis. Musculoskeletal: Orthopedic fixation  hardware in the lower cervical spine incidentally noted. There are no aggressive appearing lytic or blastic lesions noted in the visualized portions of the skeleton. IMPRESSION: 1. No acute findings in the thorax. 2. Aortic atherosclerosis, in addition to right coronary artery disease. Please note that although the presence of coronary artery calcium documents the presence of coronary artery disease, the severity of this disease and any potential stenosis cannot be assessed on this non-gated CT examination. Assessment for potential risk factor modification, dietary therapy or pharmacologic therapy may be warranted, if clinically indicated. 3. Diffuse bronchial wall thickening with mild to moderate centrilobular and paraseptal emphysema; imaging findings suggestive of underlying COPD. 4. Morphologic changes in the liver suggesting underlying cirrhosis. Aortic Atherosclerosis (ICD10-I70.0) and Emphysema (ICD10-J43.9). Electronically Signed   By: Vinnie Langton M.D.   On: 02/22/2023 05:34   DG Chest Portable 1 View  Result Date: 02/21/2023 CLINICAL DATA:  Shortness of breath EXAM: PORTABLE CHEST 1 VIEW COMPARISON:  01/10/23 CXR FINDINGS: No pleural effusion. No pneumothorax. Normal cardiac and mediastinal contours. No focal airspace opacity. No radiographically apparent displaced rib fractures. Partially visualized cervical spinal fusion hardware in place. Visualized upper abdomen is unremarkable. IMPRESSION: No focal airspace opacity. Electronically Signed   By: Marin Roberts M.D.   On: 02/21/2023 21:59     TODAY-DAY OF DISCHARGE:  Subjective:   [name] today has no headache,no chest abdominal pain,no new weakness tingling or numbness, feels much better wants to go home today.   Objective:   Blood pressure (!) 95/52, pulse 71, temperature 98 F (36.7 C), temperature source Oral, resp. rate 18, height '5\' 3"'$  (1.6 m), weight 88.1 kg, SpO2 97 %. No intake or output data in the 24 hours ending  02/27/23 0941 Filed Weights   02/21/23 2058 02/22/23 1531  Weight: 87.5 kg 88.1 kg    Exam: Awake Alert, Oriented *3, No new F.N deficits, Normal affect [place].AT,PERRAL Supple Neck,No JVD, No cervical lymphadenopathy appriciated.  Symmetrical Chest  wall movement, Good air movement bilaterally, CTAB RRR,No Gallops,Rubs or new Murmurs, No Parasternal Heave +ve B.Sounds, Abd Soft, Non tender, No organomegaly appriciated, No rebound -guarding or rigidity. No Cyanosis, Clubbing or edema, No new Rash or bruise   PERTINENT RADIOLOGIC STUDIES: No results found.   PERTINENT LAB RESULTS: CBC: No results for input(s): "WBC", "HGB", "HCT", "PLT" in the last 72 hours. CMET CMP     Component Value Date/Time   NA 136 02/27/2023 0220   NA 144 07/31/2019 1151   K 3.8 02/27/2023 0220   CL 100 02/27/2023 0220   CO2 25 02/27/2023 0220   GLUCOSE 107 (H) 02/27/2023 0220   BUN 16 02/27/2023 0220   BUN 11 07/31/2019 1151   CREATININE 0.72 02/27/2023 0220   CREATININE 0.71 02/23/2014 1054   CALCIUM 8.8 (L) 02/27/2023 0220   PROT 6.3 (L) 02/24/2023 0012   PROT 6.5 07/31/2019 1151   ALBUMIN 3.3 (L) 02/24/2023 0012   ALBUMIN 4.3 07/31/2019 1151   AST 22 02/24/2023 0012   ALT 22 02/24/2023 0012   ALKPHOS 70 02/24/2023 0012   BILITOT 0.4 02/24/2023 0012   BILITOT <0.2 07/31/2019 1151   GFRNONAA >60 02/27/2023 0220   GFRNONAA >89 02/23/2014 1054   GFRAA >60 07/19/2020 1113   GFRAA >89 02/23/2014 1054    GFR Estimated Creatinine Clearance: 78.7 mL/min (by C-G formula based on SCr of 0.72 mg/dL). No results for input(s): "LIPASE", "AMYLASE" in the last 72 hours. No results for input(s): "CKTOTAL", "CKMB", "CKMBINDEX", "TROPONINI" in the last 72 hours. Invalid input(s): "POCBNP" No results for input(s): "DDIMER" in the last 72 hours. No results for input(s): "HGBA1C" in the last 72 hours. No results for input(s): "CHOL", "HDL", "LDLCALC", "TRIG", "CHOLHDL", "LDLDIRECT" in the last 72  hours. No results for input(s): "TSH", "T4TOTAL", "T3FREE", "THYROIDAB" in the last 72 hours.  Invalid input(s): "FREET3" No results for input(s): "VITAMINB12", "FOLATE", "FERRITIN", "TIBC", "IRON", "RETICCTPCT" in the last 72 hours. Coags: No results for input(s): "INR" in the last 72 hours.  Invalid input(s): "PT" Microbiology: Recent Results (from the past 240 hour(s))  Resp panel by RT-PCR (RSV, Flu A&B, Covid) Anterior Nasal Swab     Status: None   Collection Time: 02/21/23 11:19 PM   Specimen: Anterior Nasal Swab  Result Value Ref Range Status   SARS Coronavirus 2 by RT PCR NEGATIVE NEGATIVE Final   Influenza A by PCR NEGATIVE NEGATIVE Final   Influenza B by PCR NEGATIVE NEGATIVE Final    Comment: (NOTE) The Xpert Xpress SARS-CoV-2/FLU/RSV plus assay is intended as an aid in the diagnosis of influenza from Nasopharyngeal swab specimens and should not be used as a sole basis for treatment. Nasal washings and aspirates are unacceptable for Xpert Xpress SARS-CoV-2/FLU/RSV testing.  Fact Sheet for Patients: EntrepreneurPulse.com.au  Fact Sheet for Healthcare Providers: IncredibleEmployment.be  This test is not yet approved or cleared by the [place] FDA and has been authorized for detection and/or diagnosis of SARS-CoV-2 by FDA under an Emergency Use Authorization (EUA). This EUA will remain in effect (meaning this test can be used) for the duration of the COVID-19 declaration under Section 564(b)(1) of the Act, 21 U.S.C. section 360bbb-3(b)(1), unless the authorization is terminated or revoked.     Resp Syncytial Virus by PCR NEGATIVE NEGATIVE Final    Comment: (NOTE) Fact Sheet for Patients: EntrepreneurPulse.com.au  Fact Sheet for Healthcare Providers: IncredibleEmployment.be  This test is not yet approved or cleared by the [place] and has been authorized  for detection and/or  diagnosis of SARS-CoV-2 by FDA under an Emergency Use Authorization (EUA). This EUA will remain in effect (meaning this test can be used) for the duration of the COVID-19 declaration under Section 564(b)(1) of the Act, 21 U.S.C. section 360bbb-3(b)(1), unless the authorization is terminated or revoked.  Performed at [facility], [place] [address] [place], [place] [address]   MRSA Next Gen by PCR, Nasal     Status: None   Collection Time: 02/22/23  3:33 PM   Specimen: Nasal Mucosa; Nasal Swab  Result Value Ref Range Status   MRSA by PCR Next Gen NOT DETECTED NOT DETECTED Final    Comment: (NOTE) The GeneXpert MRSA Assay (FDA approved for NASAL specimens only), is one component of a comprehensive MRSA colonization surveillance program. It is not intended to diagnose MRSA infection nor to guide or monitor treatment for MRSA infections. Test performance is not FDA approved in patients less than 33 years old. Performed at [facility], [place] [address], [place], [place] [address]     FURTHER DISCHARGE INSTRUCTIONS:  Get Medicines reviewed and adjusted: Please take all your medications with you for your next visit with your Primary MD  Laboratory/radiological data: Please request your Primary MD to go over all hospital tests and procedure/radiological results at the follow up, please ask your Primary MD to get all Hospital records sent to his/her office.  In some cases, they will be blood work, cultures and biopsy results pending at the time of your discharge. Please request that your primary care M.D. goes through all the records of your hospital data and follows up on these results.  Also Note the following: If you experience worsening of your admission symptoms, develop shortness of breath, life threatening emergency, suicidal or homicidal thoughts you must seek medical attention immediately by calling 911 or calling your MD immediately  if symptoms less severe.  You must  read complete instructions/literature along with all the possible adverse reactions/side effects for all the Medicines you take and that have been prescribed to you. Take any new Medicines after you have completely understood and accpet all the possible adverse reactions/side effects.   Do not drive when taking Pain medications or sleeping medications (Benzodaizepines)  Do not take more than prescribed Pain, Sleep and Anxiety Medications. It is not advisable to combine anxiety,sleep and pain medications without talking with your primary care practitioner  Special Instructions: If you have smoked or chewed Tobacco  in the last 2 yrs please stop smoking, stop any regular Alcohol  and or any Recreational drug use.  Wear Seat belts while driving.  Please note: You were cared for by a hospitalist during your hospital stay. Once you are discharged, your primary care physician will handle any further medical issues. Please note that NO REFILLS for any discharge medications will be authorized once you are discharged, as it is imperative that you return to your primary care physician (or establish a relationship with a primary care physician if you do not have one) for your post hospital discharge needs so that they can reassess your need for medications and monitor your lab values.  Total Time spent coordinating discharge including counseling, education and face to face time equals greater than 30 minutes.  SignedOren Binet 02/27/2023 9:41 AM

## 2023-02-27 NOTE — Progress Notes (Signed)
Patient discharged to home with self care.  Leaving the unit to go to discharge lounge to wait for a ride.

## 2023-02-27 NOTE — Care Management Important Message (Signed)
Important Message  Patient Details  Name: [name] MRN: [id number] Date of Birth: [birth year]   Medicare Important Message Given:  Yes Patient left prior to IM delivery will mail a copy to the patient home address.     Jahara Dail 02/27/2023, 3:12 PM

## 2023-02-27 NOTE — Progress Notes (Signed)
DISCHARGE NOTE HOME [name] to be discharged Home per MD order. Discussed prescriptions and follow up appointments with the patient. Prescriptions given to patient; medication list explained in detail. Patient verbalized understanding.  Skin clean, dry and intact without evidence of skin break down, no evidence of skin tears noted. IV catheter discontinued intact. Site without signs and symptoms of complications. Dressing and pressure applied. Pt denies pain at the site currently. No complaints noted.  Patient free of lines, drains, and wounds.   An After Visit Summary (AVS) was printed and given to the patient. Patient escorted via wheelchair, and discharged home via private auto.  Anastasio Auerbach, RN

## 2023-02-27 NOTE — TOC Transition Note (Signed)
Transition of Care [facility]) - CM/SW Discharge Note   Patient Details  Name: [name] MRN: [id number] Date of Birth: 03-22-1961  [facility]) CM/SW Contact:  Tom-Johnson, Renea Ee, RN Phone Number: 02/27/2023, 10:26 AM   Clinical Narrative:     Patient is scheduled for discharge today. Readmission Prevention Assessment done. Outpatient referrals, hospital f/u and discharge instructions on AVS.  Prescription sent to [place] and meds to be delivered to patient at bedside prior to discharge.  Family to transport at discharge. No further TOC needs noted.          Final next level of care: OP Rehab (Speech) Barriers to Discharge: Barriers Resolved   Patient Goals and CMS Choice      Discharge Placement                  Patient to be transferred to facility by: Family      Discharge Plan and Services Additional resources added to the After Visit Summary for                  DME Arranged: N/A DME Agency: NA       HH Arranged: NA HH Agency: NA        Social Determinants of Health (SDOH) Interventions SDOH Screenings   Food Insecurity: No Food Insecurity (02/23/2023)  Recent Concern: [place] Present (01/11/2023)  Housing: Low Risk  (02/23/2023)  Transportation Needs: No Transportation Needs (02/23/2023)  Recent Concern: Transportation Needs - Unmet Transportation Needs (01/11/2023)  Utilities: Not At Risk (02/23/2023)  Alcohol Screen: Low Risk  (11/26/2022)  Depression (PHQ2-9): Low Risk  (01/25/2023)  Recent Concern: Depression (PHQ2-9) - Medium Risk (11/26/2022)  Financial Resource Strain: Low Risk  (11/26/2022)  Physical Activity: Sufficiently Active (11/26/2022)  Social Connections: Socially Isolated (11/26/2022)  Stress: No Stress Concern Present (11/26/2022)  Tobacco Use: Medium Risk (02/21/2023)     Readmission Risk Interventions    02/27/2023   10:26 AM  Readmission Risk Prevention Plan   Transportation Screening Complete  Medication Review (RN Care Manager) Referral to Pharmacy  PCP or Specialist appointment within 3-5 days of discharge Complete  HRI or Agenda Complete  SW Recovery Care/Counseling Consult Complete  [place] Not Applicable

## 2023-02-28 ENCOUNTER — Encounter: Payer: Self-pay | Admitting: Allergy & Immunology

## 2023-02-28 ENCOUNTER — Ambulatory Visit (INDEPENDENT_AMBULATORY_CARE_PROVIDER_SITE_OTHER): Payer: Medicare HMO | Admitting: Allergy & Immunology

## 2023-02-28 ENCOUNTER — Other Ambulatory Visit: Payer: Self-pay

## 2023-02-28 ENCOUNTER — Other Ambulatory Visit: Payer: Self-pay | Admitting: Internal Medicine

## 2023-02-28 ENCOUNTER — Telehealth: Payer: Self-pay | Admitting: *Deleted

## 2023-02-28 ENCOUNTER — Encounter: Payer: Self-pay | Admitting: *Deleted

## 2023-02-28 VITALS — BP 136/80 | HR 94 | Temp 98.6°F | Resp 18

## 2023-02-28 DIAGNOSIS — K219 Gastro-esophageal reflux disease without esophagitis: Secondary | ICD-10-CM

## 2023-02-28 DIAGNOSIS — L508 Other urticaria: Secondary | ICD-10-CM | POA: Diagnosis not present

## 2023-02-28 DIAGNOSIS — B999 Unspecified infectious disease: Secondary | ICD-10-CM

## 2023-02-28 DIAGNOSIS — J4550 Severe persistent asthma, uncomplicated: Secondary | ICD-10-CM

## 2023-02-28 MED ORDER — MONTELUKAST SODIUM 10 MG PO TABS: 10.0000 mg | ORAL_TABLET | Freq: Every day | ORAL | 1 refills | Status: DC

## 2023-02-28 MED ORDER — ALBUTEROL SULFATE HFA 108 (90 BASE) MCG/ACT IN AERS: 2.0000 | INHALATION_SPRAY | Freq: Four times a day (QID) | RESPIRATORY_TRACT | 1 refills | Status: DC | PRN

## 2023-02-28 MED ORDER — CETIRIZINE HCL 10 MG PO TABS: ORAL_TABLET | ORAL | 5 refills | Status: DC

## 2023-02-28 MED ORDER — FLUTICASONE PROPIONATE 50 MCG/ACT NA SUSP: 1.0000 | Freq: Two times a day (BID) | NASAL | 5 refills | Status: DC | PRN

## 2023-02-28 MED ORDER — TACROLIMUS 0.03 % EX OINT: 1.0000 | TOPICAL_OINTMENT | Freq: Two times a day (BID) | CUTANEOUS | 5 refills | Status: AC | PRN

## 2023-02-28 MED ORDER — TRIAMCINOLONE ACETONIDE 0.1 % EX OINT: 1.0000 | TOPICAL_OINTMENT | Freq: Every evening | CUTANEOUS | 5 refills | Status: DC

## 2023-02-28 MED ORDER — IPRATROPIUM BROMIDE HFA 17 MCG/ACT IN AERS: INHALATION_SPRAY | RESPIRATORY_TRACT | 5 refills | Status: DC

## 2023-02-28 NOTE — Transitions of Care (Post Inpatient/ED Visit) (Signed)
02/28/2023  Name: [name] MRN: [id number] DOB: 01-31-1961  Today's TOC FU Call Status: Today's TOC FU Call Status:: Successful TOC FU Call Competed TOC FU Call Complete Date: 02/28/23  Transition Care Management Follow-up Telephone Call Date of Discharge: 02/27/23 Discharge Facility: [facility]) Type of Discharge: Inpatient Admission Primary Inpatient Discharge Diagnosis:: SOB; COPD exacerbation; chronic respiratory failure with hypoxia/ hypercarbia How have you been since you were released from the hospital?: Better ("I feel much better; just taking my time.  I have a lot of appointments coming up, I am planning to go to all of them") Any questions or concerns?: No  Items Reviewed: Did you receive and understand the discharge instructions provided?: Yes (thoroughly reviewed with patient who verbalizes excellent understanding of same) Medications obtained and verified?: Yes (Medications Reviewed) (full medication review completed; no concerns or discrepancies identified; confirmed patient obtained/ is taking all newly Rx'd medications as instructed; self-manages medications and denies questions/ concerns around medications today) Any new allergies since your discharge?: No Dietary orders reviewed?: Yes (noted previous food inescurity reported: today- patient denies food insecurity) Type of Diet Ordered:: "Healthy as possible" Do you have support at home?: Yes People in Home: alone Name of Support/Comfort Primary Source: reports she is independent in self care activities; family assists as indicated/ needed; reports her grandson helps her with meal prepartion  Home Care and Equipment/Supplies: [place] Ordered?: No Any new equipment or medical supplies ordered?: No  Functional Questionnaire: Do you need assistance with bathing/showering or dressing?: No Do you need assistance with meal preparation?: Yes (grandson assists per patient report) Do  you need assistance with eating?: No Do you have difficulty maintaining continence: No Do you need assistance with getting out of bed/getting out of a chair/moving?: No Do you have difficulty managing or taking your medications?: No  Folllow up appointments reviewed: PCP Follow-up appointment confirmed?: Yes Date of PCP follow-up appointment?: 03/07/23 (care coordination outreach in real-time with scheduling care guide to successfully schedule hospital follow up PCP appointment) Follow-up Provider: PCP [facility] Follow-up appointment confirmed?: Yes Date of Specialist follow-up appointment?: 02/28/23 Follow-Up Specialty Provider:: Asthma/ Allergy provider this afternoon 02/28/23; pulmonary provider 03/05/23 Do you need transportation to your follow-up appointment?: No Do you understand care options if your condition(s) worsen?: Yes-patient verbalized understanding  SDOH Interventions Today    Flowsheet Row Most Recent Value  SDOH Interventions   Food Insecurity Interventions Intervention Not Indicated  [noted previous food inescurity reported: today- patient denies food insecurity]  Transportation Interventions Intervention Not Indicated  [reports normally drives self,  family assisting post- recent hospitalization]      TOC Interventions Today    Flowsheet Row Most Recent Value  TOC Interventions   TOC Interventions Discussed/Reviewed TOC Interventions Discussed, Arranged PCP follow up within 7 days/Care Guide scheduled  [care coordination outreach in real-time with scheduling care guide to successfully schedule hospital follow up PCP appointment]      Interventions Today    Flowsheet Row Most Recent Value  Chronic Disease   Chronic disease during today's visit Chronic Obstructive Pulmonary Disease (COPD)  General Interventions   General Interventions Discussed/Reviewed Doctor Visits, Communication with  Doctor Visits Discussed/Reviewed Doctor Visits Discussed, PCP,  Specialist  PCP/Specialist Visits Compliance with follow-up visit  Communication with RN  [confirmed RN CM Care Coordinator currently active in patient's care]  Nutrition Interventions   Nutrition Discussed/Reviewed Nutrition Discussed  Pharmacy Interventions   Pharmacy Dicussed/Reviewed Pharmacy Topics Discussed  [Full medication review  with updating medication list in EHR per patient report]      Oneta Rack, RN, BSN, CCRN Alumnus RN CM Care Coordination/ Transition of [place] Management [phone]: direct office

## 2023-02-28 NOTE — Progress Notes (Signed)
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 chest CT next visit)    Never smoker   Recurrent admissions for breathing issues with hypercapnia and hypoxia- thought to be secondary to untreated obstructive sleep apnea (now on BiPAP with good results)   Normal chest CT (March 2023)    Perennial allergic rhinitis (dust mites) - on allergen immunotherapy   Chronic urticaria - controlled with antihistamines alone   Recurrent infections - with largely normal workup in 2020 and then [place]luster Streptococcal titers in November 99991111   Complicated past medical history, including disability as well as bipolar 1 depression and vitamin D deficiency  Plan/Recommendations:   1. Asthma-COPD overlap syndrome - Lung testing not done today. - I will just continue with your Dupixent and wait to see what Dr. Lamonte Sakai recommends regarding your inhalers/nebulizers.  - Daily controller medication(s): Dupixent every two weeks - Prior to physical activity: Ventolin (GRAY INHALER) 2 puffs 10-15 minutes before physical activity. - Rescue medications: Ventolin (GRAY INHALER) 2 puffs AND 2 puffs Atrovent (GREEN INHALER) every 4-6 hours as needed - Asthma control goals:  * Full participation in all desired activities (may need albuterol before activity) * Albuterol use two time or less a week on average (not counting use with activity) * Cough interfering with sleep two time or less a month * Oral steroids no more than once a year * No hospitalizations   2. Chronic urticaria - under good control - Continue with levocetirizine twice daily AS NEEDED. - Continue with famotidine twice daily AS NEEDED.  - Continue with Doxepin '25mg'$  at night to help with itching AS NEEDED.   3. 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 - Continue with  allergy shots at the same schedule (come back next week so you start your allergy shots again).   4. Follow up in 1-2 months or earlier if needed.    Subjective:   [name] is a 62 y.o. female presenting today for follow up of  Chief Complaint  Patient presents with   Follow-up   Fatigue    [name] has a history of the following: Patient Active Problem List   Diagnosis Date Noted   Urinary incontinence 01/25/2023   Acute asthma exacerbation 01/12/2023   Influenza A 01/11/2023   Acute exacerbation of COPD with asthma ([place]) 01/11/2023   Bilateral impacted cerumen 12/04/2022   Not well controlled severe persistent asthma 10/25/2022   Acute on chronic respiratory failure with hypoxemia ([place]) 10/07/2022   Abnormal TSH 09/13/2022   Chronic sinusitis 08/14/2022   PND (paroxysmal nocturnal dyspnea) 06/07/2022   Vitamin D deficiency 05/20/2022   Eczema 05/18/2022   Insomnia 05/18/2022   Allergic conjunctivitis 05/18/2022   Chronic respiratory failure with hypoxia and hypercapnia ([place]) 04/30/2022   Bilateral hearing loss 04/14/2022   Moderate persistent asthma without complication [id number]   Low back pain 10/11/2021   Abnormal CXR 07/17/2021   Aortic atherosclerosis ([place]) 07/05/2021   URI (upper respiratory infection) 07/05/2021   Arthrodesis status 06/20/2021   Drug allergy 05/14/2021   Acute cough 05/14/2021   Bilateral leg weakness 04/17/2021   Status post cervical spinal fusion 12/27/2020   History of stroke 12/11/2020   Medication management 10/21/2020   Body mass index (BMI) 26.0-26.9, adult 08/23/2020   Healthcare maintenance 08/11/2020   Cervical spondylosis with myelopathy  and radiculopathy 07/28/2020   Preop exam for internal medicine 06/19/2020   Hypotension due to medication 06/11/2020   Status asthmaticus 06/09/2020   Essential (primary) hypertension 05/17/2020   Other spondylosis with myelopathy, cervical region  02/16/2020   Cervical spondylosis 02/02/2020   Radiculopathy, cervical region 02/02/2020   Spondylolysis, cervical region 02/02/2020   Neck pain 02/02/2020   Leg cramping 08/12/2019   Laryngopharyngeal reflux (LPR) 05/26/2019   Sinus pressure 05/26/2019   Hypophosphatemia 01/07/2019   Vertigo 09/23/2018   Dysfunction of left eustachian tube 09/23/2018   Gait disorder 04/21/2018   Leukocytosis 04/15/2018   Nausea & vomiting 04/15/2018   Seizure disorder ([place])    Prolapsed cervical intervertebral disc 03/11/2018   Cervical radiculopathy 02/18/2018   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   Syncope 09/04/2017   Constipation 09/04/2017   Nonintractable headache 09/04/2017   Leg swelling 07/26/2017   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   Hyperlipidemia 04/20/2017   Pedal edema 04/19/2017   Bipolar I disorder ([place]) 09/19/2016   Facial pain 07/12/2016   Rash 06/14/2016   Migraine 01/25/2016   Idiopathic urticaria/pruritus 01/23/2016   Allergic rhinitis 01/23/2016   Oral candidiasis 01/23/2016   Greater trochanteric bursitis of both hips 09/08/2015   Chest pain 06/22/2015   Itching 06/22/2015   Bilateral knee pain 06/22/2015   Bilateral hip pain 06/22/2015   Anemia, iron deficiency 12/22/2014   Fatigue 12/21/2014   Rash and nonspecific skin eruption 08/24/2014   Intertrigo 08/07/2014   Dyspareunia 07/12/2014   Menopausal syndrome (hot flushes) 07/12/2014   History of tobacco abuse 07/12/2014   Dizziness 06/22/2014   Weakness 06/22/2014   Fibromyalgia 05/14/2014   Encounter for well adult exam with abnormal findings 04/22/2014   Recurrent boils 04/22/2014   Recurrent falls 04/22/2014   Peripheral vascular disease ([place])    Depression    Anxiety    Gastroesophageal reflux disease    Prediabetes 02/23/2014   Screening mammogram for high-risk patient 02/23/2014   Back pain 07/22/2013   COPD/asthma 08/24/2009   Headache(784.0) 08/24/2009   Asthma-COPD overlap syndrome  05/12/2009    History obtained from: chart review and patient.  [name] is a 62 y.o. female presenting for a follow up visit.  She was last seen in November 2023 for sick visit.  At that time, she had a rash.  She was started on triamcinolone 0.1% cream up to twice daily.  She was continued on montelukast as well as Dupixent and Breztri.  She was transitioned to all nebulized medications including Yupelri, budesonide, and Perforomist.  Since last visit, she has not done well.  She was recently in the hospital for 6-7 days. She was hospitalized at [facility]. She just had a COPD exacerbation. She was actually hospitalized in January 2024 with influenza. She is going to check with a speech therapist. This is scheduled for next week to look for vocal cord dysfunction. She never had an evaluation for VCD at all. She was seen by Dr. Lamonte Sakai while in the hospital and this is her primary pulmonologist.   I reviewed the admission HPI from her most recent exacerbation.  She was reporting wheezing triggers including fumes, colognes, automobile exhaust, and cigarettes.  She reported that she was unable to cook or go to the grocery store because of his constant smells.  In the ED, she is placed on BiPAP and admitted.  Asthma/Respiratory Symptom History: She is seeing Dr. Lamonte Sakai on the 19th of March. This is the same day as the speech  therapist.  She is very confused with what she is supposed to be taking. She actually is not taking anything at all. She does have a CPAP and she wakes up SOB when she uses this. She does pull out [place] and [place] today. This is what she was discharged on. But again, she has not taken anything since she was discharged from the hospital. She does have her rescue inhaler to sue as needed. She is just frustrated.   Allergic Rhinitis Symptom History: She is open to restarting her allergy shots.  We are going to hold off because of her recent admission to the hospital.  She remains on her  levocetirizine as well as Flonase.   [place] is on allergen immunotherapy. She receives one injection. Immunotherapy script #1 contains dust mites. She currently receives 0.60m of the RED vial (1/100). She started shots September of 2021 and reached maintenance in January of 2022.   Skin Symptom History: Skin is under good control with the Dupixent and the ointments.  She remains on the Protopic and the triamcinolone.  She has not had any hives with the current regimen.  She has been consistent with her Dupixent.  She has not had any more infections since we last saw her.  She had a Prevnar 20 in June 2022.  She did get a Pneumovax in December 2016. Her Streptococcal titers were lackluster, but her infectious history has not been too serious since we saw her in November (aside from influenza).  She did have azithromycin prescribed at the end of January 2024.  I assume this had something to do with her influenza, but about entirely sure.  She had 1 dose of doxycycline to the hospital, but this was not continued.  She had a course of azithromycin during the hospital patient in January 2023.    Otherwise, there have been no changes to her past medical history, surgical history, family history, or social history.    Review of Systems  Constitutional: Negative.  Negative for chills, fever, malaise/fatigue and weight loss.  HENT: Negative.  Negative for congestion, ear discharge and ear pain.   Eyes:  Negative for pain, discharge and redness.  Respiratory:  Positive for cough and wheezing. Negative for sputum production and shortness of breath.   Cardiovascular: Negative.  Negative for chest pain and palpitations.  Gastrointestinal:  Negative for abdominal pain, constipation, diarrhea, heartburn, nausea and vomiting.  Skin: Negative.  Negative for itching and rash.  Neurological:  Negative for dizziness and headaches.  Endo/Heme/Allergies:  Positive for environmental allergies. Does not  bruise/bleed easily.  Psychiatric/Behavioral:  The patient is nervous/anxious.        Objective:   Blood pressure 136/80, pulse 94, temperature 98.6 F (37 C), temperature source Temporal, resp. rate 18, SpO2 94 %. There is no height or weight on file to calculate BMI.    Physical Exam Vitals reviewed.  Constitutional:      Appearance: She is well-developed.     Comments: Very pleasant. Cooperative with the exam.   HENT:     Head: Normocephalic and atraumatic.     [name] Ear: Tympanic membrane, ear canal and external ear normal.     Left Ear: Tympanic membrane, ear canal and external ear normal.     Nose: No nasal deformity, septal deviation, mucosal edema or rhinorrhea.     [name] Turbinates: Enlarged, swollen and pale.     Left Turbinates: Enlarged, swollen and pale.     [name] Sinus: No maxillary sinus  tenderness or frontal sinus tenderness.     Left Sinus: No maxillary sinus tenderness or frontal sinus tenderness.     Comments: No nasal polyps noted.     Mouth/Throat:     Mouth: Mucous membranes are not pale and not dry.     Pharynx: Uvula midline.     Comments: Cobblestoning present in the posterior oropharynx.  Eyes:     General: Lids are normal. Allergic shiner present.        [name] eye: No discharge.        Left eye: No discharge.     Conjunctiva/sclera: Conjunctivae normal.     [name] eye: [name] conjunctiva is not injected. No chemosis.    Left eye: Left conjunctiva is not injected. No chemosis.    Pupils: Pupils are equal, round, and reactive to light.  Cardiovascular:     Rate and Rhythm: Normal rate and regular rhythm.     Heart sounds: Normal heart sounds.  Pulmonary:     Effort: Pulmonary effort is normal. No tachypnea, accessory muscle usage or respiratory distress.     Breath sounds: Normal breath sounds. No wheezing, rhonchi or rales.     Comments: Expiratory wheezing at the bases.  Chest:     Chest wall: No tenderness.  Lymphadenopathy:     Cervical:  No cervical adenopathy.  Skin:    General: Skin is warm.     Capillary Refill: Capillary refill takes less than 2 seconds.     Coloration: Skin is not pale.     Findings: No abrasion, erythema, petechiae or rash. Rash is not papular, urticarial or vesicular.     Comments: Skin looks clear.   Neurological:     Mental Status: She is alert.  Psychiatric:        Behavior: Behavior is cooperative.      Diagnostic studies: none      Salvatore Marvel, MD  [facility]

## 2023-02-28 NOTE — Patient Instructions (Addendum)
1. Asthma-COPD overlap syndrome - Lung testing not done today. - I will just continue with your Dupixent and wait to see what Dr. Lamonte Sakai recommends regarding your inhalers/nebulizers.  - Daily controller medication(s): Dupixent every two weeks - Prior to physical activity: Ventolin (GRAY INHALER) 2 puffs 10-15 minutes before physical activity. - Rescue medications: Ventolin (GRAY INHALER) 2 puffs AND 2 puffs Atrovent (GREEN INHALER) every 4-6 hours as needed - Asthma control goals:  * Full participation in all desired activities (may need albuterol before activity) * Albuterol use two time or less a week on average (not counting use with activity) * Cough interfering with sleep two time or less a month * Oral steroids no more than once a year * No hospitalizations   2. Chronic urticaria - under good control - Continue with levocetirizine twice daily AS NEEDED. - Continue with famotidine twice daily AS NEEDED.  - Continue with Doxepin '25mg'$  at night to help with itching AS NEEDED.   3. 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 - Continue with allergy shots at the same schedule (come back next week so you start your allergy shots again).   4. Follow up in 1-2 months or earlier if needed.    Please inform us of any Emergency Department visits, hospitalizations, or changes in symptoms. Call us before going to the ED for breathing or allergy symptoms since we might be able to fit you in for a sick visit. Feel free to contact us anytime with any questions, problems, or concerns.  It was a pleasure to see you again today!  Websites that have reliable patient information:  1. American Academy of Asthma, Allergy, and Immunology: www.aaaai.org 2. Food Allergy Research and Education (FARE): foodallergy.org 3. Mothers of Asthmatics: http://www.asthmacommunitynetwork.org 4. American College of Allergy, Asthma, and Immunology:  www.acaai.org   COVID-19 Vaccine Information can be found at: ShippingScam.co.uk For questions related to vaccine distribution or appointments, please email [email or web address] or call [phone].     "Like" [place] on Facebook and Instagram for our latest updates!       Make sure you are registered to vote! If you have moved or changed any of your contact information, you will need to get this updated before voting!  In some cases, you MAY be able to register to vote online: [email or web address]

## 2023-03-01 NOTE — Therapy (Unsigned)
OUTPATIENT SPEECH LANGUAGE PATHOLOGY VOICE EVALUATION   Patient Name: [name] MRN: [id number] DOB:Nov 01, 1961, 62 y.o., female Today's Date: 03/01/2023  PCP: Biagio Borg REFERRING PROVIDER: Rico Ala, NP  END OF SESSION:   Past Medical History:  Diagnosis Date   Anemia, iron deficiency 12/22/2014   pt. 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      Bipolar disorder ([place])    with anxiety, depression   COPD (chronic obstructive pulmonary disease) ([place])    Eczema 05/18/2022   Fibromyalgia 05/14/2014   GERD (gastroesophageal reflux disease)    History of kidney stones    Hyperlipidemia 04/20/2017   HYPERTENSION 05/12/2009   Qualifier: Diagnosis of  By: Lenn Cal Deborra Medina), [name]     Peripheral vascular disease ([place])    Pneumonia    Prediabetes 02/23/2014   pt. denies   Seizure ([place])    Stroke ([place]) 11/2020   Urticaria    Past Surgical History:  Procedure Laterality Date   ABDOMINAL HYSTERECTOMY     ANTERIOR CERVICAL DECOMP/DISCECTOMY FUSION N/A 07/28/2020   Procedure: ANTERIOR CERVICAL DECOMPRESSION/DISCECTOMY FUSION. INTERBODY PROTHESIS, PLATE/SCREWS CERVICAL THREE- CERVICAL FOUR, CERVICAL FOUR- CERVICAL FIVE;  Surgeon: Newman Pies, MD;  Location: [facility];  Service: Neurosurgery;  Laterality: N/A;   BACK SURGERY     COLONOSCOPY  12/20/2011   Procedure: COLONOSCOPY;  Surgeon: Landry Dyke, MD;  Location: [facility];  Service: Endoscopy;  Laterality: N/A;   COLONOSCOPY  03/05/2012   Procedure: COLONOSCOPY;  Surgeon: Landry Dyke, MD;  Location: [facility];  Service: Endoscopy;  Laterality: N/A;   DIAGNOSTIC LAPAROSCOPY     HEMORRHOID SURGERY     INCISE AND DRAIN ABCESS     KIDNEY STONE SURGERY     NECK SURGERY     x 2 Dr Orinda Kenner   SPINE  SURGERY  2019   TOE SURGERY     TUBAL LIGATION     Patient Active Problem List   Diagnosis Date Noted   Urinary incontinence 01/25/2023   Acute asthma exacerbation 01/12/2023   Influenza A 01/11/2023   Acute exacerbation of COPD with asthma ([place]) 01/11/2023   Bilateral impacted cerumen 12/04/2022   Not well controlled severe persistent asthma 10/25/2022   Acute on chronic respiratory failure with hypoxemia ([place]) 10/07/2022   Abnormal TSH 09/13/2022   Chronic sinusitis 08/14/2022   PND (paroxysmal nocturnal dyspnea) 06/07/2022   Vitamin D deficiency 05/20/2022   Eczema 05/18/2022   Insomnia 05/18/2022   Allergic conjunctivitis 05/18/2022   Chronic respiratory failure with hypoxia and hypercapnia ([facility]) 04/30/2022   Bilateral hearing loss 04/14/2022   Moderate persistent asthma without complication [id number]   Low back pain 10/11/2021   Abnormal CXR 07/17/2021   Aortic atherosclerosis ([place]) 07/05/2021   URI (upper respiratory infection) 07/05/2021   Arthrodesis status 06/20/2021   Drug allergy 05/14/2021   Acute cough 05/14/2021   Bilateral leg weakness 04/17/2021   Status post cervical spinal fusion 12/27/2020   History of stroke 12/11/2020   Medication management 10/21/2020   Body mass index (BMI) 26.0-26.9, adult 08/23/2020   Healthcare maintenance 08/11/2020   Cervical spondylosis with myelopathy and radiculopathy 07/28/2020   Preop exam for internal medicine 06/19/2020   Hypotension due to medication 06/11/2020   Status asthmaticus 06/09/2020   Essential (primary) hypertension 05/17/2020  Other spondylosis with myelopathy, cervical region 02/16/2020   Cervical spondylosis 02/02/2020   Radiculopathy, cervical region 02/02/2020   Spondylolysis, cervical region 02/02/2020   Neck pain 02/02/2020   Leg cramping 08/12/2019   Laryngopharyngeal reflux (LPR) 05/26/2019   Sinus pressure 05/26/2019   Hypophosphatemia 01/07/2019   Vertigo 09/23/2018   Dysfunction of left  eustachian tube 09/23/2018   Gait disorder 04/21/2018   Leukocytosis 04/15/2018   Nausea & vomiting 04/15/2018   Seizure disorder ([place])    Prolapsed cervical intervertebral disc 03/11/2018   Cervical radiculopathy 02/18/2018   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   Syncope 09/04/2017   Constipation 09/04/2017   Nonintractable headache 09/04/2017   Leg swelling 07/26/2017   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   Hyperlipidemia 04/20/2017   Pedal edema 04/19/2017   Bipolar I disorder ([place]) 09/19/2016   Facial pain 07/12/2016   Rash 06/14/2016   Migraine 01/25/2016   Idiopathic urticaria/pruritus 01/23/2016   Allergic rhinitis 01/23/2016   Oral candidiasis 01/23/2016   Greater trochanteric bursitis of both hips 09/08/2015   Chest pain 06/22/2015   Itching 06/22/2015   Bilateral knee pain 06/22/2015   Bilateral hip pain 06/22/2015   Anemia, iron deficiency 12/22/2014   Fatigue 12/21/2014   Rash and nonspecific skin eruption 08/24/2014   Intertrigo 08/07/2014   Dyspareunia 07/12/2014   Menopausal syndrome (hot flushes) 07/12/2014   History of tobacco abuse 07/12/2014   Dizziness 06/22/2014   Weakness 06/22/2014   Fibromyalgia 05/14/2014   Encounter for well adult exam with abnormal findings 04/22/2014   Recurrent boils 04/22/2014   Recurrent falls 04/22/2014   Peripheral vascular disease ([place])    Depression    Anxiety    Gastroesophageal reflux disease    Prediabetes 02/23/2014   Screening mammogram for high-risk patient 02/23/2014   Back pain 07/22/2013   COPD/asthma 08/24/2009   Headache(784.0) 08/24/2009   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    Onset date: 02/25/2023 referral  REFERRING DIAG:  J45.901 (ICD-10-CM) - Moderate asthma with acute exacerbation, unspecified whether persistent  J32.9 (ICD-10-CM) - Chronic sinusitis, unspecified location  H10.10 (ICD-10-CM) - Allergic conjunctivitis, unspecified laterality  R05.1 (ICD-10-CM) - Acute cough     THERAPY DIAG:  No diagnosis found.  Rationale for Evaluation and Treatment: Rehabilitation  SUBJECTIVE:   SUBJECTIVE STATEMENT: *** Pt accompanied by: {accompnied:27141}  PERTINENT HISTORY: PMH: COPD, GERD, bipolar, fibromyalgia,stroke, recent admission for flu, esophageal dilation in 2022. She has been back and forth with pulmonologist and her PCP without improvement and ultimately presented to the hospital on 02/22/23 with COPD exacerbation and respiratory failure. In ED she was initially on BiPAP and transitioned to nasal cannula and then room air. CT chest showed changes with emphysema.   PAIN:  Are you having pain? {OPRCPAIN:27236}  FALLS: Has patient fallen in last 6 months? {yes/no:20286}, Number of falls: ***  LIVING ENVIRONMENT: Lives with: {OPRC lives with:25569::"lives with their family"} Lives in: {Lives in:25570}  PLOF: {PLOF:24004}  PATIENT GOALS: ***  OBJECTIVE:   DIAGNOSTIC FINDINGS: ***  COGNITION: Overall cognitive status: {cognition:24006} Areas of impairment:  {cognitiveimpairmentslp:27409} Functional deficits: ***  SOCIAL HISTORY: Occupation: *** Occupational voice use: {intakelevel:27191} Social daily voice use: {intakelevel:27191} Water intake: {waterintake:27189} Caffeine/alcohol intake: {intakelevel:27191} Hx of smoking/tobacco use: never smoker  MEDICAL HISTORY:  Pulmonology: COPD, "persistent exertional dyspnea, decreased functional capacity"; PFT 12/05/22 Gastroenterology:  Hx of GERD or LPR: *** Allergies:  allergic rhinitis, on allergen immunotherapy Otolaryngology: followed by Dr. Constance Holster, recommends reflux precautions. No endoscopy performed.   PERCEPTUAL VOICE ASSESSMENT: Voice  quality: {VQL:27192} Vocal abuse: {VA:27193} Resonance: {resonance:27194} Respiratory function: {respbreathing:27195}  OBJECTIVE VOICE ASSESSMENT: Maximum phonation time for sustained "ah": *** Conversational pitch average: *** Hz Conversational  pitch range: *** Hz Conversational loudness average: *** dB Conversational loudness range: *** dB S/z ratio: *** (Suggestive of dysfunction >1.0)  VOCAL CORD DYSFUNCTION (VCD) ASSESSMENT: Episodes observed during evaluation: *** Characteristics observed during evaluation: *** Pt report of frequency: *** Pt report of duration: *** Pt description of VCD episodes: *** Identified triggers: {COUGH TRIGGERS:29247}  ORAL MOTOR EXAMINATION: Overall status: {OMESLP2:27645} Comments: ***  CLINICAL SWALLOW ASSESSMENT:   Current diet: {slpdiet:27196} Dentition: {dentition:27197} Patient directly observed with POs: {POobserved:27199} Feeding: {slp feeding:27200} Liquids provided by: {SLPliquids:27201} Oral phase signs and symptoms: {SLPoralphase:27202} Pharyngeal phase signs and symptoms: {SLPpharyngealphase:27203}  PATIENT REPORTED OUTCOME MEASURES (PROM): {SLPPROM:27095}  TODAY'S TREATMENT:  03/01/23: ***   PATIENT EDUCATION: Education details: *** Person educated: {Person educated:25204} Education method: {Education Method:25205} Education comprehension: {Education Comprehension:25206}  HOME EXERCISE PROGRAM: ***  GOALS: Goals reviewed with patient? {yes/no:20286}  SHORT TERM GOALS: Target date: ***  *** Baseline: Goal status: {GOALSTATUS:25110}  2.  *** Baseline:  Goal status: {GOALSTATUS:25110}  3.  *** Baseline:  Goal status: {GOALSTATUS:25110}  4.  *** Baseline:  Goal status: {GOALSTATUS:25110}  5.  *** Baseline:  Goal status: {GOALSTATUS:25110}  6.  *** Baseline:  Goal status: {GOALSTATUS:25110}  LONG TERM GOALS: Target date: ***  *** Baseline:  Goal status: {GOALSTATUS:25110}  2.  *** Baseline:  Goal status: {GOALSTATUS:25110}  3.  *** Baseline:  Goal status: {GOALSTATUS:25110}  4.  *** Baseline:  Goal status: {GOALSTATUS:25110}  5.  *** Baseline:  Goal status: {GOALSTATUS:25110}  6.  *** Baseline:  Goal status:  {GOALSTATUS:25110}  ASSESSMENT:  CLINICAL IMPRESSION: Patient is a *** y.o. *** who was seen today for ***.   OBJECTIVE IMPAIRMENTS: include {SLPOBJIMP:27107}. These impairments are limiting patient from {SLPLIMIT:27108}. Factors affecting potential to achieve goals and functional outcome are {SLP factors:25450}.. Patient will benefit from skilled SLP services to address above impairments and improve overall function.  REHAB POTENTIAL: {rehabpotential:25112}  PLAN:  SLP FREQUENCY: {rehab frequency:25116}  SLP DURATION: {rehab duration:25117}  PLANNED INTERVENTIONS: {SLP treatment/interventions:25449}    Su Monks, CCC-SLP 03/01/2023, 7:54 AM

## 2023-03-05 ENCOUNTER — Encounter: Payer: Self-pay | Admitting: Emergency Medicine

## 2023-03-05 ENCOUNTER — Ambulatory Visit: Payer: Medicare HMO | Attending: Acute Care | Admitting: Speech Pathology

## 2023-03-05 ENCOUNTER — Ambulatory Visit (HOSPITAL_COMMUNITY): Payer: Medicare HMO | Admitting: Psychiatry

## 2023-03-05 ENCOUNTER — Other Ambulatory Visit: Payer: Self-pay

## 2023-03-05 ENCOUNTER — Ambulatory Visit (INDEPENDENT_AMBULATORY_CARE_PROVIDER_SITE_OTHER): Payer: Medicare HMO | Admitting: Emergency Medicine

## 2023-03-05 ENCOUNTER — Encounter: Payer: Self-pay | Admitting: Speech Pathology

## 2023-03-05 VITALS — BP 136/78 | HR 85 | Temp 98.3°F | Ht 63.0 in | Wt 197.6 lb

## 2023-03-05 DIAGNOSIS — K219 Gastro-esophageal reflux disease without esophagitis: Secondary | ICD-10-CM | POA: Diagnosis not present

## 2023-03-05 DIAGNOSIS — J309 Allergic rhinitis, unspecified: Secondary | ICD-10-CM | POA: Diagnosis not present

## 2023-03-05 DIAGNOSIS — J45901 Unspecified asthma with (acute) exacerbation: Secondary | ICD-10-CM | POA: Insufficient documentation

## 2023-03-05 DIAGNOSIS — R051 Acute cough: Secondary | ICD-10-CM | POA: Insufficient documentation

## 2023-03-05 DIAGNOSIS — J4489 Other specified chronic obstructive pulmonary disease: Secondary | ICD-10-CM

## 2023-03-05 DIAGNOSIS — R490 Dysphonia: Secondary | ICD-10-CM | POA: Diagnosis not present

## 2023-03-05 DIAGNOSIS — H1010 Acute atopic conjunctivitis, unspecified eye: Secondary | ICD-10-CM | POA: Diagnosis not present

## 2023-03-05 DIAGNOSIS — J329 Chronic sinusitis, unspecified: Secondary | ICD-10-CM | POA: Insufficient documentation

## 2023-03-05 DIAGNOSIS — J383 Other diseases of vocal cords: Secondary | ICD-10-CM | POA: Diagnosis not present

## 2023-03-05 NOTE — Assessment & Plan Note (Signed)
-   Continue Pepcid and Protonix 

## 2023-03-05 NOTE — Assessment & Plan Note (Signed)
Xyzal, Flonase, Singulair

## 2023-03-05 NOTE — Patient Instructions (Signed)
Please continue Breztri 2 puffs twice a day.  Rinse and gargle after using. Keep albuterol available to use 2 puffs when needed for shortness of breath, chest tightness, wheezing. Use your DuoNeb up to every 6 hours if needed for shortness of breath Continue your Protonix and Pepcid as you have been taking them Continue fluticasone nasal spray, Xyzal and Singulair as you have been taking them. Strongly agree with continuing to follow-up with speech therapy.  Hopefully they will be able to help you with techniques that will allow you to relax your vocal cords and upper airway when you begin to feel throat wheezing, tightening or shortness of breath Follow-up with APP in 2 months Follow with Dr Lamonte Sakai in 6 months or sooner if you have any problems

## 2023-03-05 NOTE — Assessment & Plan Note (Signed)
Treatment or to her dyspnea, symptoms.  We are trying to treat GERD, rhinitis as well as possible.  She has gone to see SLP and I think she will benefit significantly.

## 2023-03-05 NOTE — Progress Notes (Signed)
Subjective:    Patient ID: [name], female    DOB: 12/24/1960, 62 y.o.   MRN: [id number]  COPD She complains of cough, shortness of breath and wheezing. Associated symptoms include chest pain. Pertinent negatives include no fever, postnasal drip or rhinorrhea. Her past medical history is significant for COPD.    ROV 12/05/22 --follow-up visit for a 62 year old woman with asthmatic COPD with frequent exacerbations, irritable upper airway syndrome and VCD.  All impacted by GERD, chronic rhinitis.  She has chronic hypercapnic respiratory failure and is on home BiPAP, wears it reliably.  I saw her in November.  She had been changed to nebulized regimen of Perforomist, Pulmicort and Maretta Bees - tells me that she cannot tolerate these, caused SOB, wheeze. She went back to breztri. She uses albuterol a few times a day.  She is also on Dupixent every 14 days, Flonase, Xyzal, Singulair, Pepcid, Protonix.  She underwent pulmonary function testing today as below.   PFT performed 12/05/2022 and reviewed by me show very severe obstruction with a positive bronchodilator response, hyperinflated lung volumes, decreased diffusion capacity that does not fully correct when adjusted for alveolar volume.   Total follow-up visit 03/05/2023 --62 year old woman with a history of asthmatic COPD and frequent exacerbations.  She has upper airway instability and VCD, GERD, chronic rhinitis.  Also with chronic hypercapnic respiratory failure for which she has been treated with BiPAP nightly.  She was just in the hospital for an acute exacerbation, discharged on/13/24.  She was treated with Maretta Bees and budesonide, Brovana while in the hospital.  Also daily pantoprazole.  Remains on Dupixent every 14 days as well as Xyzal, Flonase, Singulair, Pepcid and Protonix.  Her maintenance bronchodilator regimen includes Breztri, uses DuoNeb approximately every other day. Completed prednisone taper.  SLP evalaution today >  had some laryngospasm on their eval, they did work on some relaxation techniques    Review of Systems  Constitutional:  Positive for fatigue. Negative for chills and fever.  HENT:  Negative for postnasal drip, rhinorrhea and sinus pressure.   Respiratory:  Positive for cough, shortness of breath and wheezing.   Cardiovascular:  Positive for chest pain. Negative for palpitations and leg swelling.       Objective:   Physical Exam Vitals:   03/05/23 1516  BP: 136/78  Pulse: 85  Temp: 98.3 F (36.8 C)  TempSrc: Oral  SpO2: 98%  Weight: 197 lb 9.6 oz (89.6 kg)  Height: 5\' 3"  (1.6 m)   Gen: Pleasant, well-nourished, in no distress, transient voice instability, mostly normal, normal affect  ENT: No lesions,  mouth clear,  oropharynx clear, no postnasal drip  Neck: No JVD, no stridor  Lungs: No use of accessory muscles, very distant but no wheezing.  Cardiovascular: RRR, heart sounds normal, no murmur or gallops, no peripheral edema  Musculoskeletal: No deformities, no cyanosis or clubbing  Neuro: alert, non focal  Skin: Warm, no lesions or rashes      Assessment & Plan:   Asthma-COPD overlap syndrome Recent hospitalization and exacerbation.  Unclear how much of this was her upper airway instability versus her obstructive lung disease.  She did require BiPAP, improved with standard therapy including steroids which she has just completed.  Continue Breztri as her maintenance therapy.  Vocal cord dysfunction Treatment or to her dyspnea, symptoms.  We are trying to treat GERD, rhinitis as well as possible.  She has gone to see SLP and I think she will benefit significantly.  Allergic  rhinitis Xyzal, Flonase, Singulair  Laryngopharyngeal reflux (LPR) Continue Pepcid and Protonix.  Baltazar Apo, MD, PhD 03/05/2023, 4:53 PM [facility] [phone] or if no answer [phone]

## 2023-03-05 NOTE — Assessment & Plan Note (Addendum)
Recent hospitalization and exacerbation.  Unclear how much of this was her upper airway instability versus her obstructive lung disease.  She did require BiPAP, improved with standard therapy including steroids which she has just completed.  Continue Breztri as her maintenance therapy.

## 2023-03-07 ENCOUNTER — Ambulatory Visit (INDEPENDENT_AMBULATORY_CARE_PROVIDER_SITE_OTHER): Payer: 59 | Admitting: Internal Medicine

## 2023-03-07 ENCOUNTER — Ambulatory Visit (INDEPENDENT_AMBULATORY_CARE_PROVIDER_SITE_OTHER): Payer: Medicare HMO

## 2023-03-07 VITALS — BP 132/80 | HR 79 | Temp 98.8°F | Ht 63.0 in | Wt 195.0 lb

## 2023-03-07 DIAGNOSIS — H6123 Impacted cerumen, bilateral: Secondary | ICD-10-CM

## 2023-03-07 DIAGNOSIS — J309 Allergic rhinitis, unspecified: Secondary | ICD-10-CM | POA: Diagnosis not present

## 2023-03-07 DIAGNOSIS — J383 Other diseases of vocal cords: Secondary | ICD-10-CM | POA: Diagnosis not present

## 2023-03-07 DIAGNOSIS — J441 Chronic obstructive pulmonary disease with (acute) exacerbation: Secondary | ICD-10-CM

## 2023-03-07 DIAGNOSIS — K7460 Unspecified cirrhosis of liver: Secondary | ICD-10-CM

## 2023-03-07 DIAGNOSIS — J4540 Moderate persistent asthma, uncomplicated: Secondary | ICD-10-CM

## 2023-03-07 NOTE — Patient Instructions (Addendum)
Please continue all other medications as before, and refills have been done if requested.  Please have the pharmacy call with any other refills you may need.  Please continue your efforts at being more active, low cholesterol diet, and weight control.  Please keep your appointments with your specialists as you may have planned  Your ears were irrigated of wax today  You will be contacted regarding the referral for: Gastroenterology for liver cirrhosis where the reason is not clear  We can hold on further blood tests today  Please make an Appointment to return in June 5, or sooner if needed

## 2023-03-07 NOTE — Progress Notes (Signed)
PRE-PROCEDURE EXAM: Left TM cannot be visualized due to total occlusion/impaction of the ear canal.  PROCEDURE INDICATION: remove wax to visualize ear drum & relieve discomfort  CONSENT:  Verbal     PROCEDURE NOTE:     BILATERAL EAR:  I used warm water irrigation under direct visualization with the otoscope to free the wax bolus from the ear canal.    POST- PROCEDURE EXAM: TMs successfully visualized and found to have no erythema     The patient tolerated the procedure well.   

## 2023-03-07 NOTE — Progress Notes (Signed)
Patient ID: [name], female   DOB: May 21, 1961, 62 y.o.   MRN: [id number]        Chief Complaint: follow up post hospn 3/7 to 3/13       HPI:  [name] is a 62 y.o. female here for above with copd exacerbation, now improved  - no fever, chills, Pt denies chest pain, increased sob or doe, wheezing, orthopnea, PND, increased LE swelling, palpitations, dizziness or syncope, though Did have mild attack sob this am, overall better with speech therapy behavior modification and neb that seemed to help as well.  To see speech therapy soon for possible vocal cord dysfunction. Good compliance with meds.  Pt has f/u appt pulm mar 19 for consideration ENT referral and/or bronchoscopy.  CT chest c/w cirrhosis seen incidentally, acute hep panel and Lfts neg, Also incidentally with bilateral impacted wax to ears.   Wt Readings from Last 3 Encounters:  03/07/23 195 lb (88.5 kg)  03/05/23 197 lb 9.6 oz (89.6 kg)  02/22/23 194 lb 3.6 oz (88.1 kg)   BP Readings from Last 3 Encounters:  03/07/23 132/80  03/05/23 136/78  02/28/23 136/80         Past Medical History:  Diagnosis Date   Anemia, iron deficiency 12/22/2014   pt. 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      Bipolar disorder ([place])    with anxiety, depression   COPD (chronic obstructive pulmonary disease) ([place])    Eczema 05/18/2022   Fibromyalgia 05/14/2014   GERD (gastroesophageal reflux disease)    History of kidney stones    Hyperlipidemia 04/20/2017   HYPERTENSION 05/12/2009   Qualifier: Diagnosis of  By: Lenn Cal Deborra Medina), [name]     Peripheral vascular disease ([place])    Pneumonia    Prediabetes 02/23/2014   pt. denies   Seizure ([place])    Stroke ([place]) 11/2020   Urticaria    Past Surgical History:  Procedure  Laterality Date   ABDOMINAL HYSTERECTOMY     ANTERIOR CERVICAL DECOMP/DISCECTOMY FUSION N/A 07/28/2020   Procedure: ANTERIOR CERVICAL DECOMPRESSION/DISCECTOMY FUSION. INTERBODY PROTHESIS, PLATE/SCREWS CERVICAL THREE- CERVICAL FOUR, CERVICAL FOUR- CERVICAL FIVE;  Surgeon: Newman Pies, MD;  Location: [facility];  Service: Neurosurgery;  Laterality: N/A;   BACK SURGERY     COLONOSCOPY  12/20/2011   Procedure: COLONOSCOPY;  Surgeon: Landry Dyke, MD;  Location: [facility];  Service: Endoscopy;  Laterality: N/A;   COLONOSCOPY  03/05/2012   Procedure: COLONOSCOPY;  Surgeon: Landry Dyke, MD;  Location: [facility];  Service: Endoscopy;  Laterality: N/A;   DIAGNOSTIC LAPAROSCOPY     HEMORRHOID SURGERY     INCISE AND DRAIN ABCESS     KIDNEY STONE SURGERY     NECK SURGERY     x 2 Dr Orinda Kenner   SPINE SURGERY  2019   TOE SURGERY     TUBAL LIGATION      reports that she quit smoking about 4 years ago. Her smoking use included cigarettes. She has a 9.00 pack-year smoking history. She has never used smokeless tobacco. She reports that she does not currently use alcohol. She reports that she does not currently use drugs after having used the following drugs: Marijuana. family history includes Alcohol abuse in her brother and father; Anemia in her daughter,  sister, and sister; Anesthesia problems in her father; Asthma in her brother, mother, and sister; Brain cancer in her sister; Breast cancer (age of onset: 73) in her sister; COPD in her mother and sister; Colon cancer (age of onset: 45) in her father; Diabetes in her brother, father, mother, sister, sister, sister, and sister; Heart disease in her brother, brother, mother, and sister; Hypertension in her brother, daughter, sister, sister, sister, sister, and sister; Kidney disease in her father; Sleep apnea in her brother. Allergies  Allergen Reactions   Sulfa Antibiotics Nausea And Vomiting and Other (See Comments)   Venofer [Iron Sucrose] Hives    Current Outpatient Medications on File Prior to Visit  Medication Sig Dispense Refill   albuterol (VENTOLIN HFA) 108 (90 Base) MCG/ACT inhaler Inhale 2 puffs into the lungs every 6 (six) hours as needed for wheezing or shortness of breath. 18 g 1   atorvastatin (LIPITOR) 40 MG tablet Take 1 tablet (40 mg total) by mouth daily. TAKE 1 TABLET(40 MG) BY MOUTH DAILY Strength: 40 mg (Patient taking differently: Take 40 mg by mouth daily.) 90 tablet 3   azelastine (OPTIVAR) 0.05 % ophthalmic solution Place 1 drop into both eyes 2 (two) times daily. 6 mL 12   BELSOMRA 15 MG TABS Take 15 mg by mouth at bedtime as needed (sleep).     BREZTRI AEROSPHERE 160-9-4.8 MCG/ACT AERO Inhale 2 puffs into the lungs in the morning and at bedtime.     butalbital-acetaminophen-caffeine (FIORICET) 50-325-40 MG tablet 1 tab by mouth once daily as needed for HA, limit 14 per month 14 tablet 0   carbamazepine (TEGRETOL) 200 MG tablet Take 2-3 tablets (400-600 mg total) by mouth See admin instructions. Take 400mg  in the morning and 600mg  at bedtime. 150 tablet 2   cetirizine (ZYRTEC) 10 MG tablet Can take one tablet by mouth once daily if needed for runny nose. 30 tablet 5   Cholecalciferol (VITAMIN D) 125 MCG (5000 UT) CAPS Take 5,000 Units by mouth daily.     cyclobenzaprine (FLEXERIL) 5 MG tablet Take 5 mg by mouth 3 (three) times daily as needed for muscle spasms.     famotidine (PEPCID) 40 MG tablet Take 1 tablet (40 mg total) by mouth at bedtime. 30 tablet 3   ferrous sulfate 325 (65 FE) MG EC tablet Take 1 tablet (325 mg total) by mouth daily with breakfast. 90 tablet 1   fluticasone (FLONASE) 50 MCG/ACT nasal spray Place 1 spray into both nostrils 2 (two) times daily as needed for allergies or rhinitis. 16 g 5   gabapentin (NEURONTIN) 100 MG capsule Take 1 capsule (100 mg total) by mouth 3 (three) times daily. 90 capsule 3   hydrochlorothiazide (MICROZIDE) 12.5 MG capsule Take 1 capsule (12.5 mg total) by mouth  daily. TAKE 1 CAPSULE(12.5 MG) BY MOUTH DAILY Strength: 12.5 mg (Patient taking differently: Take 12.5 mg by mouth daily.) 90 capsule 3   hydrOXYzine (VISTARIL) 25 MG capsule Take 1 capsule (25 mg total) by mouth every 6 (six) hours as needed for anxiety. 25 capsule 4   ipratropium (ATROVENT HFA) 17 MCG/ACT inhaler 2 puffs every 4-6 hours as needed. 12.9 g 5   ipratropium-albuterol (DUONEB) 0.5-2.5 (3) MG/3ML SOLN Take 3 mLs by nebulization 2 (two) times daily. 360 mL 3   irbesartan (AVAPRO) 75 MG tablet TAKE 1 TABLET(75 MG) BY MOUTH DAILY (Patient taking differently: Take 75 mg by mouth daily.) 90 tablet 1   montelukast (SINGULAIR) 10 MG tablet Take 1  tablet (10 mg total) by mouth at bedtime. 90 tablet 1   ondansetron (ZOFRAN ODT) 4 MG disintegrating tablet Take 1 tablet (4 mg total) by mouth every 8 (eight) hours as needed for nausea or vomiting. 20 tablet 0   pantoprazole (PROTONIX) 40 MG tablet Take 1 tablet (40 mg total) by mouth 2 (two) times daily. 60 tablet 3   potassium chloride (KLOR-CON M) 10 MEQ tablet Take 1 tablet (10 mEq total) by mouth daily. 90 tablet 3   predniSONE (DELTASONE) 10 MG tablet Take 4 tablets (40 mg) once daily for 1 day, 3 tablets (30mg ) daily for 1 day, 2 tablets (20mg ) daily for 1 days, then 1 tablet (10mg ) daily for 1 day, then stop 10 tablet 0   revefenacin (YUPELRI) 175 MCG/3ML nebulizer solution Take 3 mLs (175 mcg total) by nebulization daily. 90 mL 5   Skin Protectants, Misc. (EUCERIN) cream Apply 1 Application topically daily as needed for dry skin.     tacrolimus (PROTOPIC) 0.03 % ointment Apply 1 Application topically 2 (two) times daily as needed (rash). 60 g 5   triamcinolone ointment (KENALOG) 0.1 % Apply 1 Application topically at bedtime. 30 g 5   Current Facility-Administered Medications on File Prior to Visit  Medication Dose Route Frequency Provider Last Rate Last Admin   dupilumab (DUPIXENT) prefilled syringe 300 mg  300 mg Subcutaneous Q14 Days  Valentina Shaggy, MD   300 mg at 02/28/23 1718        ROS:  All others reviewed and negative.  Objective        PE:  BP 132/80   Pulse 79   Temp 98.8 F (37.1 C) (Oral)   Ht 5\' 3"  (1.6 m)   Wt 195 lb (88.5 kg)   SpO2 94%   BMI 34.54 kg/m                 Constitutional: Pt appears in NAD               HENT: Head: NCAT.                Right Ear: External ear normal.                 Left Ear: External ear normal.                Eyes: . Pupils are equal, round, and reactive to light. Conjunctivae and EOM are normal               Nose: without d/c or deformity               Neck: Neck supple. Gross normal ROM               Cardiovascular: Normal rate and regular rhythm.                 Pulmonary/Chest: Effort normal and breath sounds without rales or wheezing.                Abd:  Soft, NT, ND, + BS, no organomegaly               Neurological: Pt is alert. At baseline orientation, motor grossly intact               Skin: Skin is warm. No rashes, no other new lesions, LE edema - none               Psychiatric: Pt behavior is  normal without agitation   Micro: none  Cardiac tracings I have personally interpreted today:  none  Pertinent Radiological findings (summarize): none   Lab Results  Component Value Date   WBC 6.2 02/22/2023   HGB 12.6 02/22/2023   HCT 38.6 02/22/2023   PLT 327 02/22/2023   GLUCOSE 107 (H) 02/27/2023   CHOL 186 05/18/2022   TRIG 55.0 05/18/2022   HDL 96.00 05/18/2022   LDLCALC 79 05/18/2022   ALT 22 02/24/2023   AST 22 02/24/2023   NA 136 02/27/2023   K 3.8 02/27/2023   CL 100 02/27/2023   CREATININE 0.72 02/27/2023   BUN 16 02/27/2023   CO2 25 02/27/2023   TSH 1.16 09/13/2022   INR 0.9 02/06/2022   HGBA1C 6.5 09/13/2022   MICROALBUR 0.9 04/11/2021   Assessment/Plan:  [name] is a 62 y.o. Black or African American [2] female with  has a past medical history of Anemia, iron deficiency (12/22/2014), Arthritis, ASTHMA  (05/12/2009), Bipolar disorder ([place]), COPD (chronic obstructive pulmonary disease) ([place]), Eczema (05/18/2022), Fibromyalgia (05/14/2014), GERD (gastroesophageal reflux disease), History of kidney stones, Hyperlipidemia (04/20/2017), HYPERTENSION (05/12/2009), Peripheral vascular disease ([place]), Pneumonia, Prediabetes (02/23/2014), Seizure ([place]), Stroke ([place]) (11/2020), and Urticaria.  COPD/asthma Improved now overalls stable, cont current tx and f/u pulm mar 19 as planned  Bilateral impacted cerumen Ceruminosis is noted.  Wax is removed by syringing and manual debridement. Instructions for home care to prevent wax buildup are given.   Cirrhosis ([place]) Incidental, etiology unclear, for GI referral per pt reqeust  Vocal cord dysfunction For f/u speech therapy as planned  Followup: Return in about 2 months (around 05/22/2023).  Cathlean Cower, MD 03/10/2023 7:17 AM [facility]

## 2023-03-08 ENCOUNTER — Ambulatory Visit: Payer: Self-pay

## 2023-03-08 NOTE — Patient Instructions (Signed)
Visit Information  Thank you for taking time to visit with me today. Please don't hesitate to contact me if I can be of assistance to you.   Following are the goals we discussed today:   Goals Addressed             This Visit's Progress    Support with Health managment education       Interventions Today    Flowsheet Row Most Recent Value  Chronic Disease   Chronic disease during today's visit Chronic Obstructive Pulmonary Disease (COPD), Other  [post hospitalization COPD/asthma]  General Interventions   General Interventions Discussed/Reviewed General Interventions Reviewed, Doctor Visits  Doctor Visits Discussed/Reviewed Doctor Visits Reviewed  PCP/Specialist Visits Compliance with follow-up visit  Exercise Interventions   Exercise Discussed/Reviewed Physical Activity  Physical Activity Discussed/Reviewed Physical Activity Discussed  Education Interventions   Education Provided Provided Education  Provided Verbal Education On Exercise, When to see the doctor, Other  [reviewed provider instructions from latest OV with asthma specialist and pulmonologist and PCP.  Encouraged patient to contact provider for worsening of condition, with health questions or concerns.]  Pharmacy Interventions   Pharmacy Dicussed/Reviewed Pharmacy Topics Reviewed           Our next appointment is by telephone on 03/20/23 at 9:45 am  Please call the care guide team at [phone] if you need to cancel or reschedule your appointment.   If you are experiencing a Mental Health or [place] or need someone to talk to, please call the Suicide and Crisis Lifeline: [place], RN, MSN, BSN, [place] [phone]

## 2023-03-08 NOTE — Patient Outreach (Signed)
  Care Coordination   Follow Up Visit Note   03/08/2023 Name: [name] MRN: [id number] DOB: 03-05-1961  [name] is a 62 y.o. year old female who sees Biagio Borg, MD for primary care. I spoke with  [name] by phone today.  What matters to the patients health and wellness today? Hospital admission 02/21/23-02/27/23 with COPD exacerbation. Ms. Teubert reports she is slowly improving. She reports she is active with speech therapy re: vocal cord dysfunction. And has followed up with PCP, asthma specialist and pulmonologist. She is without any questions or concerns at this time.  Goals Addressed             This Visit's Progress    Support with Health managment education       Interventions Today    Flowsheet Row Most Recent Value  Chronic Disease   Chronic disease during today's visit Chronic Obstructive Pulmonary Disease (COPD)  General Interventions   General Interventions Discussed/Reviewed General Interventions Reviewed, Doctor Visits  Doctor Visits Discussed/Reviewed Doctor Visits Reviewed  PCP/Specialist Visits Compliance with follow-up visit  Exercise Interventions   Exercise Discussed/Reviewed Physical Activity  Physical Activity Discussed/Reviewed Physical Activity Discussed  Education Interventions   Education Provided Provided Education  Provided Verbal Education On Exercise, When to see the doctor, Other  [reviewed provider instructions from OV with asthma specialist and pulmonologist., encouraged patient to contact provider for worsening of condition, with questions or concerns.]  Pharmacy Interventions   Pharmacy Dicussed/Reviewed Pharmacy Topics Reviewed           SDOH assessments and interventions completed:  No  Care Coordination Interventions:  Yes, provided   Follow up plan: Follow up call scheduled for 03/20/23    Encounter Outcome:  Pt. Visit Completed   Thea Silversmith, RN, MSN, BSN, [place]  Coordinator [phone]

## 2023-03-10 ENCOUNTER — Encounter: Payer: Self-pay | Admitting: Internal Medicine

## 2023-03-10 NOTE — Assessment & Plan Note (Signed)
Ceruminosis is noted.  Wax is removed by syringing and manual debridement. Instructions for home care to prevent wax buildup are given.  

## 2023-03-10 NOTE — Assessment & Plan Note (Signed)
Improved now overalls stable, cont current tx and f/u pulm mar 19 as planned

## 2023-03-10 NOTE — Assessment & Plan Note (Signed)
Incidental, etiology unclear, for GI referral per pt reqeust

## 2023-03-10 NOTE — Assessment & Plan Note (Signed)
For f/u speech therapy as planned

## 2023-03-13 ENCOUNTER — Ambulatory Visit: Payer: Medicare HMO | Admitting: Speech Pathology

## 2023-03-13 ENCOUNTER — Other Ambulatory Visit: Payer: Self-pay | Admitting: Internal Medicine

## 2023-03-13 ENCOUNTER — Telehealth: Payer: Self-pay | Admitting: Speech Pathology

## 2023-03-13 DIAGNOSIS — J383 Other diseases of vocal cords: Secondary | ICD-10-CM | POA: Diagnosis not present

## 2023-03-13 DIAGNOSIS — R490 Dysphonia: Secondary | ICD-10-CM

## 2023-03-13 DIAGNOSIS — H1010 Acute atopic conjunctivitis, unspecified eye: Secondary | ICD-10-CM | POA: Diagnosis not present

## 2023-03-13 DIAGNOSIS — J45901 Unspecified asthma with (acute) exacerbation: Secondary | ICD-10-CM | POA: Diagnosis not present

## 2023-03-13 DIAGNOSIS — R051 Acute cough: Secondary | ICD-10-CM | POA: Diagnosis not present

## 2023-03-13 DIAGNOSIS — R296 Repeated falls: Secondary | ICD-10-CM

## 2023-03-13 DIAGNOSIS — J329 Chronic sinusitis, unspecified: Secondary | ICD-10-CM | POA: Diagnosis not present

## 2023-03-13 DIAGNOSIS — R269 Unspecified abnormalities of gait and mobility: Secondary | ICD-10-CM

## 2023-03-13 NOTE — Therapy (Signed)
OUTPATIENT SPEECH LANGUAGE PATHOLOGY EVALUATION   Patient Name:  [name] MRN: [id number] DOB:1961/08/16, 62 y.o., female Today's Date: 03/13/2023  PCP: Biagio Borg REFERRING PROVIDER: Rico Ala, NP  END OF SESSION:  End of Session - 03/13/23 1315     Visit Number 2    Number of Visits 5    Date for SLP Re-Evaluation 04/02/23    Authorization Type Medicare    SLP Start Time 1315    SLP Stop Time  1400    SLP Time Calculation (min) 45 min    Activity Tolerance Patient tolerated treatment well             Past Medical History:  Diagnosis Date   Anemia, iron deficiency 12/22/2014   pt. 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      Bipolar disorder ([place])    with anxiety, depression   COPD (chronic obstructive pulmonary disease) ([place])    Eczema 05/18/2022   Fibromyalgia 05/14/2014   GERD (gastroesophageal reflux disease)    History of kidney stones    Hyperlipidemia 04/20/2017   HYPERTENSION 05/12/2009   Qualifier: Diagnosis of  By: Lenn Cal Deborra Medina), [name]     Peripheral vascular disease ([place])    Pneumonia    Prediabetes 02/23/2014   pt. denies   Seizure ([place])    Stroke ([place]) 11/2020   Urticaria    Past Surgical History:  Procedure Laterality Date   ABDOMINAL HYSTERECTOMY     ANTERIOR CERVICAL DECOMP/DISCECTOMY FUSION N/A 07/28/2020   Procedure: ANTERIOR CERVICAL DECOMPRESSION/DISCECTOMY FUSION. INTERBODY PROTHESIS, PLATE/SCREWS CERVICAL THREE- CERVICAL FOUR, CERVICAL FOUR- CERVICAL FIVE;  Surgeon: Newman Pies, MD;  Location: [place];  Service: Neurosurgery;  Laterality: N/A;   BACK SURGERY     COLONOSCOPY  12/20/2011   Procedure: COLONOSCOPY;  Surgeon: Landry Dyke, MD;  Location: [facility];  Service: Endoscopy;  Laterality: N/A;    COLONOSCOPY  03/05/2012   Procedure: COLONOSCOPY;  Surgeon: Landry Dyke, MD;  [facility];  Service: Endoscopy;  Laterality: N/A;   DIAGNOSTIC LAPAROSCOPY     HEMORRHOID SURGERY     INCISE AND DRAIN ABCESS     KIDNEY STONE SURGERY     NECK SURGERY     x 2 Dr Orinda Kenner   SPINE SURGERY  2019   TOE SURGERY     TUBAL LIGATION     Patient Active Problem List   Diagnosis Date Noted   Cirrhosis ([place]) 03/07/2023   Vocal cord dysfunction 03/05/2023   Urinary incontinence 01/25/2023   Influenza A 01/11/2023   Bilateral impacted cerumen 12/04/2022   Not well controlled severe persistent asthma 10/25/2022   Acute on chronic respiratory failure with hypoxemia ([place]) 10/07/2022   Abnormal TSH 09/13/2022   Chronic sinusitis 08/14/2022   PND (paroxysmal nocturnal dyspnea) 06/07/2022   Vitamin D deficiency 05/20/2022   Eczema 05/18/2022   Insomnia 05/18/2022   Allergic conjunctivitis 05/18/2022   Chronic respiratory failure with hypoxia and hypercapnia ([place]) 04/30/2022   Bilateral hearing loss 04/14/2022   Moderate persistent asthma without complication [id number]   Low back pain 10/11/2021   Abnormal CXR 07/17/2021   Aortic atherosclerosis ([place]) 07/05/2021   URI (upper respiratory infection) 07/05/2021   Arthrodesis status 06/20/2021   Drug allergy 05/14/2021   Acute cough 05/14/2021  Bilateral leg weakness 04/17/2021   Status post cervical spinal fusion 12/27/2020   History of stroke 12/11/2020   Medication management 10/21/2020   Body mass index (BMI) 26.0-26.9, adult 08/23/2020   Healthcare maintenance 08/11/2020   Cervical spondylosis with myelopathy and radiculopathy 07/28/2020   Preop exam for internal medicine 06/19/2020   Hypotension due to medication 06/11/2020   Essential (primary) hypertension 05/17/2020   Other spondylosis with myelopathy, cervical region 02/16/2020   Cervical spondylosis 02/02/2020   Radiculopathy, cervical region 02/02/2020    Spondylolysis, cervical region 02/02/2020   Neck pain 02/02/2020   Leg cramping 08/12/2019   Laryngopharyngeal reflux (LPR) 05/26/2019   Sinus pressure 05/26/2019   Hypophosphatemia 01/07/2019   Vertigo 09/23/2018   Dysfunction of left eustachian tube 09/23/2018   Gait disorder 04/21/2018   Leukocytosis 04/15/2018   Nausea & vomiting 04/15/2018   Seizure disorder ([place])    Prolapsed cervical intervertebral disc 03/11/2018   Cervical radiculopathy 02/18/2018   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   Syncope 09/04/2017   Constipation 09/04/2017   Nonintractable headache 09/04/2017   Leg swelling 07/26/2017   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   Hyperlipidemia 04/20/2017   Pedal edema 04/19/2017   Bipolar I disorder ([place]) 09/19/2016   Facial pain 07/12/2016   Rash 06/14/2016   Migraine 01/25/2016   Idiopathic urticaria/pruritus 01/23/2016   Allergic rhinitis 01/23/2016   Oral candidiasis 01/23/2016   Greater trochanteric bursitis of both hips 09/08/2015   Chest pain 06/22/2015   Itching 06/22/2015   Bilateral knee pain 06/22/2015   Bilateral hip pain 06/22/2015   Anemia, iron deficiency 12/22/2014   Fatigue 12/21/2014   Rash and nonspecific skin eruption 08/24/2014   Intertrigo 08/07/2014   Dyspareunia 07/12/2014   Menopausal syndrome (hot flushes) 07/12/2014   History of tobacco abuse 07/12/2014   Dizziness 06/22/2014   Weakness 06/22/2014   Fibromyalgia 05/14/2014   Encounter for well adult exam with abnormal findings 04/22/2014   Recurrent boils 04/22/2014   Recurrent falls 04/22/2014   Peripheral vascular disease ([place])    Depression    Anxiety    Gastroesophageal reflux disease    Prediabetes 02/23/2014   Screening mammogram for high-risk patient 02/23/2014   Back pain 07/22/2013   COPD/asthma 08/24/2009   Headache(784.0) 08/24/2009   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    Onset date: 02/25/2023 referral  REFERRING DIAG:  J45.901 (ICD-10-CM) -  Moderate asthma with acute exacerbation, unspecified whether persistent  J32.9 (ICD-10-CM) - Chronic sinusitis, unspecified location  H10.10 (ICD-10-CM) - Allergic conjunctivitis, unspecified laterality  R05.1 (ICD-10-CM) - Acute cough    THERAPY DIAG:  Dysphonia  Vocal cord dysfunction  Rationale for Evaluation and Treatment: Rehabilitation  SUBJECTIVE:   SUBJECTIVE STATEMENT: "I did pretty good" re: breathing exercises  Pt accompanied by: self  PERTINENT HISTORY: PMH: COPD, GERD, bipolar, fibromyalgia,stroke, recent admission for flu, esophageal dilation in 2022. She has been back and forth with pulmonologist and her PCP without improvement and ultimately presented to the hospital on 02/22/23 with COPD exacerbation and respiratory failure. In ED she was initially on BiPAP and transitioned to nasal cannula and then room air. CT chest showed changes with emphysema.   PAIN:  Are you having pain? Yes: NPRS scale: 6/10 Pain location: Head Pain description: Headache, "like being stabbed in the head" Aggravating factors: n/a Relieving factors: medication  FALLS: Has patient fallen in last 6 months? Yes, Number of falls: 5 (Will reach out to PCP about PT referral)  LIVING ENVIRONMENT: Lives with: lives alone Lives  in: House/apartment  PLOF: Independent  PATIENT GOALS: prevent further decline, improve breathing function if able  OBJECTIVE:   TODAY'S TREATMENT:  03/13/23: Continued education on induced laryngeal obstruction (ILO) breathing exercises. SLP provided model and, given min verbal cues, pt performed exercises. She reported that she has attempted to do them at home. SLP encouraged pt to practice. During structured reading task, pt's vocal quality changed, growing hoarse and strangled. Completing breathing exercises and increasing pitch did not resolve change in vocal quality.  Educated pt on using breathing techniques during her attacks at home.  Clinician recommended pt  consult her PCP for an ENT referral to obtain objective imaging. Will also contact her PCP about a PT referral, as pt reported falling recently when she went for a walk. Pt has been working on [employer] at home. Reported she has implemented changes in her diet (e.g. removing acidic foods), although has not noticed any changes/benefits.   03/01/23: Initiated breathing strategies (induced laryngeal obstruction). SLP provided model and educated pt on purpose and benefits. Began instruction in SOVT exercises and let pt through exercises. Handout provided.    PATIENT EDUCATION: Education details: See above Person educated: Patient Education method: Explanation, Demonstration, and Handouts Education comprehension: verbalized understanding and needs further education  HOME EXERCISE PROGRAM: SOVT and breathing exercises   GOALS: Goals reviewed with patient? Yes  SHORT TERM GOALS = LONG TERM GOALS (d/t length of POC): Target date: 04/02/2023  Pt will demonstrate breathing techniques to A in management of VCD with mod-I and teach back when each should be used with 100% accuracy Baseline:  Goal status: IN PROGRESS  2.  Pt will employ breathing technique to reduce duration of VCD attack 50% of opportunities in therapy session with usual min A Baseline:  Goal status: IN PROGRESS  3.  Pt will report 25% reduction of VCD sx subjectively outside of therapy   Baseline:  Goal status: IN PROGRESS  ASSESSMENT:  CLINICAL IMPRESSION: Patient is a 62 y.o. female who was seen today for voice treatment. Evaluation revealed moderate dysphonia and suspected vocal cord dysfunction (VCD). Pt's voice is c/b variable quality. With prolonged speaking, pt's voice becomes significantly more hoarse with low vocal intenisty with periods of aphonia during sustained phonation tasks. Pt denies sensation of tension in throat, denies pain with voicing. Reports has been ongoing 2-3 years, happens daily when talking on phone.  Educated pt on breathing exercise to use during moments of attacks. The pt continues to benefit from skilled ST addressing abdominal breathing (AB) and relaxation strategies, all to eliminate/reduce VCD and thus increase pt's QOL. Pt would benefit from skilled ST to address aforementioned deficits to improve QoL.    OBJECTIVE IMPAIRMENTS: include voice disorder and vocal cord dysfunction These impairments are limiting patient from effectively communicating at home and in community. Factors affecting potential to achieve goals and functional outcome are co-morbidities. Patient will benefit from skilled SLP services to address above impairments and improve overall function.  REHAB POTENTIAL: Good  PLAN:  SLP FREQUENCY: 1x/week  SLP DURATION: 4 weeks  PLANNED INTERVENTIONS: SLP instruction and feedback, Compensatory strategies, and Patient/family education    Leroy Libman, [place] 03/13/2023, 1:17 PM

## 2023-03-13 NOTE — Patient Instructions (Signed)
  SLP and pt discussed relaxation techniques for pt to think about when VCD episodes occur including mental image of relaxation, feeling tension/tightness in throat melt away or evaporate, and imagining the throat expanding. SLP educated pt about and taught a sniff-pursed lip blow for pt to try when in a VCD episode.   Practice breathing exercises:  Semi-Occluded Vocal Tract exercises when you lose your voice on the phone

## 2023-03-14 ENCOUNTER — Ambulatory Visit (INDEPENDENT_AMBULATORY_CARE_PROVIDER_SITE_OTHER): Payer: Medicare HMO | Admitting: Gastroenterology

## 2023-03-14 ENCOUNTER — Other Ambulatory Visit (INDEPENDENT_AMBULATORY_CARE_PROVIDER_SITE_OTHER): Payer: Medicare HMO

## 2023-03-14 ENCOUNTER — Encounter: Payer: Self-pay | Admitting: Gastroenterology

## 2023-03-14 VITALS — BP 142/80 | HR 102 | Ht 63.0 in | Wt 199.8 lb

## 2023-03-14 DIAGNOSIS — K7460 Unspecified cirrhosis of liver: Secondary | ICD-10-CM

## 2023-03-14 LAB — IBC + FERRITIN
Ferritin: 31.2 ng/mL (ref 10.0–291.0)
Iron: 79 ug/dL (ref 42–145)
Saturation Ratios: 27.5 % (ref 20.0–50.0)
TIBC: 287 ug/dL (ref 250.0–450.0)
Transferrin: 205 mg/dL — ABNORMAL LOW (ref 212.0–360.0)

## 2023-03-14 LAB — PROTIME-INR
INR: 0.9 ratio (ref 0.8–1.0)
Prothrombin Time: 10.5 s (ref 9.6–13.1)

## 2023-03-14 NOTE — Patient Instructions (Signed)
Your provider has requested that you go to the basement level for lab work before leaving today. Press "B" on the elevator. The lab is located at the first door on the left as you exit the elevator.  You have been scheduled for an abdominal ultrasound at [facility] (1st floor of hospital) on Thursday 03/21/23 at 9:30 am. Please arrive 30 minutes prior to your appointment for registration. Make certain not to have anything to eat or drink 6 hours prior to your appointment. Should you need to reschedule your appointment, please contact radiology at [phone]. This test typically takes about 30 minutes to perform.  _______________________________________________________  If your blood pressure at your visit was 140/90 or greater, please contact your primary care physician to follow up on this.  _______________________________________________________  If you are age 60 or older, your body mass index should be between 23-30. Your Body mass index is 35.39 kg/m. If this is out of the aforementioned range listed, please consider follow up with your Primary Care Provider.  If you are age 42 or younger, your body mass index should be between 19-25. Your Body mass index is 35.39 kg/m. If this is out of the aformentioned range listed, please consider follow up with your Primary Care Provider.   ________________________________________________________  The [facility] providers would like to encourage you to use [facility] to communicate with providers for non-urgent requests or questions.  Due to long hold times on the telephone, sending your provider a message by [facility] may be a faster and more efficient way to get a response.  Please allow 48 business hours for a response.  Please remember that this is for non-urgent requests.  _______________________________________________________

## 2023-03-14 NOTE — Progress Notes (Signed)
03/14/2023 [name] [id number] [birth year]   HISTORY OF PRESENT ILLNESS:  This is a 62 year old female who is a patient of Dr. Corena Pilgrim.  She is here today at the request of her PCP, Dr. Jenny Reichmann, for evaluation of cirrhosis.  Suspicion for this seen incidentally on the CT scan of the chest when she was admitted for COPD/asthma exacerbation earlier this month.  She had viral hepatitis studies and those were all negative.  She does not use alcohol.  No family history of liver disease to her knowledge.  Platelets and LFTs are normal.   Of note, no liver abnormalities were noted on CT chest, abdomen, pelvis with contrast in July 2022.  EGD in 2022 without any stigmata of portal hypertension.  She has no GI complaints today.  No abdominal pain, no dark or bloody stool.  No LE swelling.   Past Medical History:  Diagnosis Date   Anemia, iron deficiency 12/22/2014   pt. 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      Bipolar disorder ([place])    with anxiety, depression   COPD (chronic obstructive pulmonary disease) ([place])    Eczema 05/18/2022   Fibromyalgia 05/14/2014   GERD (gastroesophageal reflux disease)    History of kidney stones    Hyperlipidemia 04/20/2017   HYPERTENSION 05/12/2009   Qualifier: Diagnosis of  By: Lenn Cal Deborra Medina), [name]     Peripheral vascular disease ([place])    Pneumonia    Prediabetes 02/23/2014   pt. denies   Seizure ([place])    Stroke ([place]) 11/2020   Urticaria    Past Surgical History:  Procedure Laterality Date   ABDOMINAL HYSTERECTOMY     ANTERIOR CERVICAL DECOMP/DISCECTOMY FUSION N/A 07/28/2020   Procedure: ANTERIOR CERVICAL DECOMPRESSION/DISCECTOMY FUSION. INTERBODY PROTHESIS, PLATE/SCREWS CERVICAL THREE- CERVICAL FOUR, CERVICAL FOUR- CERVICAL FIVE;  Surgeon:  Newman Pies, MD;  Location: [place];  Service: Neurosurgery;  Laterality: N/A;   BACK SURGERY     COLONOSCOPY  12/20/2011   Procedure: COLONOSCOPY;  Surgeon: Landry Dyke, MD;  [facility];  Service: Endoscopy;  Laterality: N/A;   COLONOSCOPY  03/05/2012   Procedure: COLONOSCOPY;  Surgeon: Landry Dyke, MD;  [facility];  Service: Endoscopy;  Laterality: N/A;   DIAGNOSTIC LAPAROSCOPY     HEMORRHOID SURGERY     INCISE AND DRAIN ABCESS     KIDNEY STONE SURGERY     NECK SURGERY     x 2 Dr Orinda Kenner   SPINE SURGERY  2019   TOE SURGERY     TUBAL LIGATION      reports that she quit smoking about 4 years ago. Her smoking use included cigarettes. She has a 9.00 pack-year smoking history. She has never used smokeless tobacco. She reports that she does not currently use alcohol. She reports that she does not currently use drugs after having used the following drugs: Marijuana. family history includes Alcohol abuse in her brother and father; Anemia in her daughter, sister, and sister; Anesthesia problems in her father; Asthma in her brother, mother, and sister; Brain cancer in her sister; Breast cancer (age of onset: 61) in her sister; COPD in her mother and sister; Colon cancer (age of onset: 8) in her father; Diabetes in her brother, father, mother, sister, sister, sister,  and sister; Heart disease in her brother, brother, mother, and sister; Hypertension in her brother, daughter, sister, sister, sister, sister, and sister; Kidney disease in her father; Sleep apnea in her brother. Allergies  Allergen Reactions   Sulfa Antibiotics Nausea And Vomiting and Other (See Comments)   Venofer [Iron Sucrose] Hives      Outpatient Encounter Medications as of 03/14/2023  Medication Sig   albuterol (VENTOLIN HFA) 108 (90 Base) MCG/ACT inhaler Inhale 2 puffs into the lungs every 6 (six) hours as needed for wheezing or shortness of breath.   atorvastatin (LIPITOR) 40 MG tablet Take  1 tablet (40 mg total) by mouth daily. TAKE 1 TABLET(40 MG) BY MOUTH DAILY Strength: 40 mg (Patient taking differently: Take 40 mg by mouth daily.)   azelastine (OPTIVAR) 0.05 % ophthalmic solution Place 1 drop into both eyes 2 (two) times daily.   BELSOMRA 15 MG TABS Take 15 mg by mouth at bedtime as needed (sleep).   BREZTRI AEROSPHERE 160-9-4.8 MCG/ACT AERO Inhale 2 puffs into the lungs in the morning and at bedtime.   butalbital-acetaminophen-caffeine (FIORICET) 50-325-40 MG tablet 1 tab by mouth once daily as needed for HA, limit 14 per month   carbamazepine (TEGRETOL) 200 MG tablet Take 2-3 tablets (400-600 mg total) by mouth See admin instructions. Take 400mg  in the morning and 600mg  at bedtime.   cetirizine (ZYRTEC) 10 MG tablet Can take one tablet by mouth once daily if needed for runny nose.   Cholecalciferol (VITAMIN D) 125 MCG (5000 UT) CAPS Take 5,000 Units by mouth daily.   cyclobenzaprine (FLEXERIL) 5 MG tablet Take 5 mg by mouth 3 (three) times daily as needed for muscle spasms.   famotidine (PEPCID) 40 MG tablet Take 1 tablet (40 mg total) by mouth at bedtime.   ferrous sulfate 325 (65 FE) MG EC tablet Take 1 tablet (325 mg total) by mouth daily with breakfast.   fluticasone (FLONASE) 50 MCG/ACT nasal spray Place 1 spray into both nostrils 2 (two) times daily as needed for allergies or rhinitis.   gabapentin (NEURONTIN) 100 MG capsule Take 1 capsule (100 mg total) by mouth 3 (three) times daily.   hydrochlorothiazide (MICROZIDE) 12.5 MG capsule Take 1 capsule (12.5 mg total) by mouth daily. TAKE 1 CAPSULE(12.5 MG) BY MOUTH DAILY Strength: 12.5 mg (Patient taking differently: Take 12.5 mg by mouth daily.)   hydrOXYzine (VISTARIL) 25 MG capsule Take 1 capsule (25 mg total) by mouth every 6 (six) hours as needed for anxiety.   ipratropium (ATROVENT HFA) 17 MCG/ACT inhaler 2 puffs every 4-6 hours as needed.   ipratropium-albuterol (DUONEB) 0.5-2.5 (3) MG/3ML SOLN Take 3 mLs by  nebulization 2 (two) times daily.   irbesartan (AVAPRO) 75 MG tablet TAKE 1 TABLET(75 MG) BY MOUTH DAILY (Patient taking differently: Take 75 mg by mouth daily.)   montelukast (SINGULAIR) 10 MG tablet Take 1 tablet (10 mg total) by mouth at bedtime.   ondansetron (ZOFRAN ODT) 4 MG disintegrating tablet Take 1 tablet (4 mg total) by mouth every 8 (eight) hours as needed for nausea or vomiting.   pantoprazole (PROTONIX) 40 MG tablet Take 1 tablet (40 mg total) by mouth 2 (two) times daily.   potassium chloride (KLOR-CON M) 10 MEQ tablet Take 1 tablet (10 mEq total) by mouth daily.   predniSONE (DELTASONE) 10 MG tablet Take 4 tablets (40 mg) once daily for 1 day, 3 tablets (30mg ) daily for 1 day, 2 tablets (20mg ) daily for 1 days, then 1 tablet (10mg )  daily for 1 day, then stop   revefenacin (YUPELRI) 175 MCG/3ML nebulizer solution Take 3 mLs (175 mcg total) by nebulization daily.   Skin Protectants, Misc. (EUCERIN) cream Apply 1 Application topically daily as needed for dry skin.   tacrolimus (PROTOPIC) 0.03 % ointment Apply 1 Application topically 2 (two) times daily as needed (rash).   tiZANidine (ZANAFLEX) 4 MG tablet TAKE 1 TABLET(4 MG) BY MOUTH EVERY 6 HOURS AS NEEDED FOR MUSCLE SPASMS   triamcinolone ointment (KENALOG) 0.1 % Apply 1 Application topically at bedtime.   Facility-Administered Encounter Medications as of 03/14/2023  Medication   dupilumab (DUPIXENT) prefilled syringe 300 mg     REVIEW OF SYSTEMS  : All other systems reviewed and negative except where noted in the History of Present Illness.   PHYSICAL EXAM: BP (!) 142/80   Pulse (!) 102   Ht 5\' 3"  (1.6 m)   Wt 199 lb 12.8 oz (90.6 kg)   SpO2 95%   BMI 35.39 kg/m  General: Well developed AA female in no acute distress Head: Normocephalic and atraumatic Eyes:  Sclerae anicteric, conjunctiva pink. Ears: Normal auditory acuity Lungs: Clear throughout to auscultation; had some upper airway wheezing. Heart: Slightly tachy  with rhythm; no M/R/G. Abdomen: Soft, non-distended.  BS present.  Non-tender. Musculoskeletal: Symmetrical with no gross deformities  Skin: No lesions on visible extremities Extremities: No edema  Neurological: Alert oriented x 4, grossly non-focal Psychological:  Alert and cooperative. Normal mood and affect  ASSESSMENT AND PLAN: *Cirrhosis of the liver: Suspicion for this seen incidentally on the CT scan of the chest when she was admitted for COPD/asthma exacerbation last month.  Will get a right upper quadrant abdominal ultrasound.  She had viral hepatitis studies and those were all negative.  Will need to vaccinate for hep A and hep B.  Will check all of the other liver serologies to rule out other causes of chronic liver disease.  She does not use alcohol.  No family history of liver disease to her knowledge.  Possibly NASH related.  Platelets and LFTs are normal.  Will check an AFP and a PT/INR as well.  Of note, no liver abnormalities were noted on CT chest, abdomen, pelvis with contrast in July 2022.  EGD in 2022 without any stigmata of portal hypertension.  May need to consider repeating that.  Will await all these results and then bring her back for follow-up for further discussion.  CC:  Biagio Borg, MD

## 2023-03-18 ENCOUNTER — Ambulatory Visit (INDEPENDENT_AMBULATORY_CARE_PROVIDER_SITE_OTHER): Payer: Medicare HMO | Admitting: *Deleted

## 2023-03-18 DIAGNOSIS — J4550 Severe persistent asthma, uncomplicated: Secondary | ICD-10-CM | POA: Diagnosis not present

## 2023-03-19 NOTE — Progress Notes (Signed)
____________________________________________________________  Attending physician addendum:  Thank you for sending this case to me. I have reviewed the entire note and agree with the plan.  I reviewed the CT images, and it is a subtle and nonspecific finding of nodular contour to the liver edge. Given that and the lack of lab, exam or imaging findings to suggest portal hypertension, it is not clear if this is truly cirrhosis or clinical significance.  I agree with the ultrasound, however please have your clinical assistant amend the radiology order so that is an elastography study.  Wilfrid Lund, MD  ____________________________________________________________

## 2023-03-20 ENCOUNTER — Ambulatory Visit: Payer: Self-pay

## 2023-03-20 ENCOUNTER — Telehealth: Payer: Self-pay

## 2023-03-20 ENCOUNTER — Ambulatory Visit: Payer: Medicare HMO | Attending: Acute Care | Admitting: Speech Pathology

## 2023-03-20 DIAGNOSIS — M25551 Pain in right hip: Secondary | ICD-10-CM | POA: Insufficient documentation

## 2023-03-20 DIAGNOSIS — J383 Other diseases of vocal cords: Secondary | ICD-10-CM | POA: Diagnosis not present

## 2023-03-20 DIAGNOSIS — J449 Chronic obstructive pulmonary disease, unspecified: Secondary | ICD-10-CM | POA: Diagnosis not present

## 2023-03-20 DIAGNOSIS — M25561 Pain in right knee: Secondary | ICD-10-CM | POA: Diagnosis not present

## 2023-03-20 DIAGNOSIS — R296 Repeated falls: Secondary | ICD-10-CM | POA: Insufficient documentation

## 2023-03-20 DIAGNOSIS — M25562 Pain in left knee: Secondary | ICD-10-CM | POA: Diagnosis not present

## 2023-03-20 DIAGNOSIS — G8929 Other chronic pain: Secondary | ICD-10-CM | POA: Insufficient documentation

## 2023-03-20 DIAGNOSIS — R490 Dysphonia: Secondary | ICD-10-CM | POA: Diagnosis not present

## 2023-03-20 DIAGNOSIS — J9610 Chronic respiratory failure, unspecified whether with hypoxia or hypercapnia: Secondary | ICD-10-CM | POA: Diagnosis not present

## 2023-03-20 DIAGNOSIS — M25552 Pain in left hip: Secondary | ICD-10-CM | POA: Diagnosis not present

## 2023-03-20 DIAGNOSIS — R932 Abnormal findings on diagnostic imaging of liver and biliary tract: Secondary | ICD-10-CM

## 2023-03-20 DIAGNOSIS — M6281 Muscle weakness (generalized): Secondary | ICD-10-CM | POA: Insufficient documentation

## 2023-03-20 DIAGNOSIS — K7460 Unspecified cirrhosis of liver: Secondary | ICD-10-CM

## 2023-03-20 LAB — ANA: Anti Nuclear Antibody (ANA): NEGATIVE

## 2023-03-20 LAB — ALPHA-1-ANTITRYPSIN: A-1 Antitrypsin, Ser: 131 mg/dL (ref 83–199)

## 2023-03-20 LAB — CERULOPLASMIN: Ceruloplasmin: 39 mg/dL (ref 18–53)

## 2023-03-20 LAB — AFP TUMOR MARKER: AFP-Tumor Marker: 3.5 ng/mL

## 2023-03-20 LAB — ANTI-SMOOTH MUSCLE ANTIBODY, IGG: Actin (Smooth Muscle) Antibody (IGG): <20 U (ref <20)

## 2023-03-20 LAB — MITOCHONDRIAL ANTIBODIES: Mitochondrial M2 Ab, IgG: <=20 U (ref <=20.0)

## 2023-03-20 LAB — IGG: IgG (Immunoglobin G), Serum: 676 mg/dL (ref 600–1640)

## 2023-03-20 NOTE — Telephone Encounter (Signed)
-----   Message from Levin Erp, [place] sent at 03/20/2023  9:29 AM EDT ----- Regarding: u/s with elastography Dr. Loletha Carrow recommends an ultrasound with elastography can you change the order for Jess.  I am just reviewing her records while she is out.  Thanks, JL L ----- Message ----- From: Doran Stabler, MD Sent: 03/19/2023   3:44 PM EDT To: Loralie Champagne, PA-C     ----- Message ----- From: Loralie Champagne, PA-C Sent: 03/14/2023  12:34 PM EDT To: Doran Stabler, MD

## 2023-03-20 NOTE — Telephone Encounter (Signed)
The pt has been advised she can keep her appt as planned.    [name], Felicia H  [name], [name], RN yes       Previous Messages    ----- Message ----- From: Timothy Lasso, RN Sent: 03/20/2023  12:07 PM EDT To: [name]  So she will be able to keep her appt as scheduled?  I want to call her back and let he know. Thank you ----- Message ----- From: Belva Chimes H Sent: 03/20/2023  10:56 AM EDT To: Roosvelt Maser; Timothy Lasso, RN  Done does it need to be ruq with elasto or you want the complete? ----- Message ----- From: Timothy Lasso, RN Sent: 03/20/2023  10:39 AM EDT To: [name]; Roosvelt Maser  I just sent you a message about this pt needing an [place] elastography.  She has an appt tomorrow for [place] limited.  Can this be kept or will she need a new appt?  I did enter the order for the elastography.

## 2023-03-20 NOTE — Therapy (Signed)
OUTPATIENT SPEECH LANGUAGE PATHOLOGY EVALUATION   Patient Name: Loyce Atamian MRN: [id number] DOB:[birth year], 62 y.o., female Today's Date: 03/20/2023  PCP: Biagio Borg REFERRING PROVIDER: Rico Ala, NP  END OF SESSION:  End of Session - 03/20/23 1312     Visit Number 3    Number of Visits 5    Date for SLP Re-Evaluation 04/02/23    Authorization Type Medicare    SLP Start Time 1315    SLP Stop Time  1345    SLP Time Calculation (min) 30 min    Activity Tolerance Patient tolerated treatment well             Past Medical History:  Diagnosis Date   Anemia, iron deficiency 12/22/2014   pt. 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      Bipolar disorder ([place])    with anxiety, depression   COPD (chronic obstructive pulmonary disease) ([place])    Eczema 05/18/2022   Fibromyalgia 05/14/2014   GERD (gastroesophageal reflux disease)    History of kidney stones    Hyperlipidemia 04/20/2017   HYPERTENSION 05/12/2009   Qualifier: Diagnosis of  By: Lenn Cal Deborra Medina), [name]     Peripheral vascular disease ([place])    Pneumonia    Prediabetes 02/23/2014   pt. denies   Seizure ([place])    Stroke ([place]) 11/2020   Urticaria    Past Surgical History:  Procedure Laterality Date   ABDOMINAL HYSTERECTOMY     ANTERIOR CERVICAL DECOMP/DISCECTOMY FUSION N/A 07/28/2020   Procedure: ANTERIOR CERVICAL DECOMPRESSION/DISCECTOMY FUSION. INTERBODY PROTHESIS, PLATE/SCREWS CERVICAL THREE- CERVICAL FOUR, CERVICAL FOUR- CERVICAL FIVE;  Surgeon: Newman Pies, MD;  Location: [facility];  Service: Neurosurgery;  Laterality: N/A;   BACK SURGERY     COLONOSCOPY  12/20/2011   Procedure: COLONOSCOPY;  Surgeon: Landry Dyke, MD;  Location: [facility];  Service: Endoscopy;  Laterality: N/A;    COLONOSCOPY  03/05/2012   Procedure: COLONOSCOPY;  Surgeon: Landry Dyke, MD;  Location: [facility];  Service: Endoscopy;  Laterality: N/A;   DIAGNOSTIC LAPAROSCOPY     HEMORRHOID SURGERY     INCISE AND DRAIN ABCESS     KIDNEY STONE SURGERY     NECK SURGERY     x 2 Dr Orinda Kenner   SPINE SURGERY  2019   TOE SURGERY     TUBAL LIGATION     Patient Active Problem List   Diagnosis Date Noted   Cirrhosis 03/07/2023   Vocal cord dysfunction 03/05/2023   Urinary incontinence 01/25/2023   Influenza A 01/11/2023   Bilateral impacted cerumen 12/04/2022   Not well controlled severe persistent asthma 10/25/2022   Acute on chronic respiratory failure with hypoxemia 10/07/2022   Abnormal TSH 09/13/2022   Chronic sinusitis 08/14/2022   PND (paroxysmal nocturnal dyspnea) 06/07/2022   Vitamin D deficiency 05/20/2022   Eczema 05/18/2022   Insomnia 05/18/2022   Allergic conjunctivitis 05/18/2022   Chronic respiratory failure with hypoxia and hypercapnia 04/30/2022   Bilateral hearing loss 04/14/2022   Moderate persistent asthma without complication [id number]   Low back pain 10/11/2021   Abnormal CXR 07/17/2021   Aortic atherosclerosis 07/05/2021   URI (upper respiratory infection) 07/05/2021   Arthrodesis status 06/20/2021   Drug allergy 05/14/2021   Acute cough 05/14/2021   Bilateral leg weakness 04/17/2021  Status post cervical spinal fusion 12/27/2020   History of stroke 12/11/2020   Medication management 10/21/2020   Body mass index (BMI) 26.0-26.9, adult 08/23/2020   Healthcare maintenance 08/11/2020   Cervical spondylosis with myelopathy and radiculopathy 07/28/2020   Preop exam for internal medicine 06/19/2020   Hypotension due to medication 06/11/2020   Essential (primary) hypertension 05/17/2020   Other spondylosis with myelopathy, cervical region 02/16/2020   Cervical spondylosis 02/02/2020   Radiculopathy, cervical region 02/02/2020   Spondylolysis, cervical region  02/02/2020   Neck pain 02/02/2020   Leg cramping 08/12/2019   Laryngopharyngeal reflux (LPR) 05/26/2019   Sinus pressure 05/26/2019   Hypophosphatemia 01/07/2019   Vertigo 09/23/2018   Dysfunction of left eustachian tube 09/23/2018   Gait disorder 04/21/2018   Leukocytosis 04/15/2018   Nausea & vomiting 04/15/2018   Seizure disorder    Prolapsed cervical intervertebral disc 03/11/2018   Cervical radiculopathy 02/18/2018   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   Syncope 09/04/2017   Constipation 09/04/2017   Nonintractable headache 09/04/2017   Leg swelling 07/26/2017   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   Hyperlipidemia 04/20/2017   Pedal edema 04/19/2017   Bipolar I disorder 09/19/2016   Facial pain 07/12/2016   Rash 06/14/2016   Migraine 01/25/2016   Idiopathic urticaria/pruritus 01/23/2016   Allergic rhinitis 01/23/2016   Oral candidiasis 01/23/2016   Greater trochanteric bursitis of both hips 09/08/2015   Chest pain 06/22/2015   Itching 06/22/2015   Bilateral knee pain 06/22/2015   Bilateral hip pain 06/22/2015   Anemia, iron deficiency 12/22/2014   Fatigue 12/21/2014   Rash and nonspecific skin eruption 08/24/2014   Intertrigo 08/07/2014   Dyspareunia 07/12/2014   Menopausal syndrome (hot flushes) 07/12/2014   History of tobacco abuse 07/12/2014   Dizziness 06/22/2014   Weakness 06/22/2014   Fibromyalgia 05/14/2014   Encounter for well adult exam with abnormal findings 04/22/2014   Recurrent boils 04/22/2014   Recurrent falls 04/22/2014   Peripheral vascular disease    Depression    Anxiety    Gastroesophageal reflux disease    Prediabetes 02/23/2014   COPD exacerbation 02/23/2014   Screening mammogram for high-risk patient 02/23/2014   Back pain 07/22/2013   COPD/asthma 08/24/2009   Headache(784.0) 08/24/2009   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    Onset date: 02/25/2023 referral  REFERRING DIAG:  J45.901 (ICD-10-CM) - Moderate asthma with acute  exacerbation, unspecified whether persistent  J32.9 (ICD-10-CM) - Chronic sinusitis, unspecified location  H10.10 (ICD-10-CM) - Allergic conjunctivitis, unspecified laterality  R05.1 (ICD-10-CM) - Acute cough    THERAPY DIAG:  Dysphonia  Vocal cord dysfunction  Rationale for Evaluation and Treatment: Rehabilitation  SUBJECTIVE:   SUBJECTIVE STATEMENT: "I'm good, but out of breath" Pt accompanied by: self  PERTINENT HISTORY: PMH: COPD, GERD, bipolar, fibromyalgia,stroke, recent admission for flu, esophageal dilation in 2022. She has been back and forth with pulmonologist and her PCP without improvement and ultimately presented to the hospital on 02/22/23 with COPD exacerbation and respiratory failure. In ED she was initially on BiPAP and transitioned to nasal cannula and then room air. CT chest showed changes with emphysema.   PAIN:  Are you having pain? Yes: NPRS scale: 6/10 Pain location: Head Pain description: Headache, "like being stabbed in the head" Aggravating factors: n/a Relieving factors: medication  FALLS: Has patient fallen in last 6 months? Yes, Number of falls: 5 (Will reach out to PCP about PT referral)  LIVING ENVIRONMENT: Lives with: lives alone Lives in: House/apartment  PLOF: Independent  PATIENT  GOALS: prevent further decline, improve breathing function if able  OBJECTIVE:   TODAY'S TREATMENT:   03/20/23: Pt reported that the ILO exercises have been helpful in the moments of her breathing attacks and have helped her to not have to call an ambulance when she is struggling to breathe. Reviewed and practiced ILO breathing exercises. With mod-I, pt able to recall 2/3 exercises and correctly demonstrate 1/3. Successfully demonstrated each when given model x1 and visual aid.  SLP provided pocket-sized reference for pt to use when she needs to recall the exercises. Provided handout.  Completed VCD-Q PROM. Pt reported positive outcomes: 50 compared to 79 previously.  She has become more aware of symptoms during attacks (e.g. throat itching).   03/13/23: Continued education on induced laryngeal obstruction (ILO) breathing exercises. SLP provided model and, given min verbal cues, pt performed exercises. She reported that she has attempted to do them at home. SLP encouraged pt to practice. During structured reading task, pt's vocal quality changed, growing hoarse and strangled. Completing breathing exercises and increasing pitch did not resolve change in vocal quality.  Educated pt on using breathing techniques during her attacks at home.  Clinician recommended pt consult her PCP for an ENT referral to obtain objective imaging. Will also contact her PCP about a PT referral, as pt reported falling recently when she went for a walk. Pt has been working on [employer] at home. Reported she has implemented changes in her diet (e.g. removing acidic foods), although has not noticed any changes/benefits.   03/01/23: Initiated breathing strategies (induced laryngeal obstruction). SLP provided model and educated pt on purpose and benefits. Began instruction in SOVT exercises and let pt through exercises. Handout provided.    PATIENT EDUCATION: Education details: See above Person educated: Patient Education method: Consulting civil engineer, Demonstration, and Handouts Education comprehension: verbalized understanding and needs further education  HOME EXERCISE PROGRAM: SOVT and breathing exercises   SPEECH THERAPY DISCHARGE SUMMARY  Visits from Start of Care: 3  Current functional level related to goals / functional outcomes: Pt reported reduction in hospitalizations and ambulance calls. She endorsed feeling more in control of her breathing in moments of attacks and reductions in lengths of episodes.    Remaining deficits: Continues to have asthma attacks vs. VCD attacks (?)   Education / Equipment: Handouts, induced laryngeal obstruction exercises    Patient agrees to discharge.  Patient goals were met. Patient is being discharged due to meeting the stated rehab goals.Marland Kitchen     GOALS: Goals reviewed with patient? Yes  SHORT TERM GOALS = LONG TERM GOALS (d/t length of POC): Target date: 04/02/2023  Pt will demonstrate breathing techniques to A in management of VCD with mod-I and teach back when each should be used with 100% accuracy Baseline:  Goal status: PARTIALLY MET  2.  Pt will employ breathing technique to reduce duration of VCD attack 50% of opportunities in therapy session with usual min A Baseline:  Goal status: MET  3.  Pt will report 25% reduction of VCD sx subjectively outside of therapy   Baseline:  Goal status: MET  ASSESSMENT:  CLINICAL IMPRESSION: Patient endorsed that she feels more in control of her breathing and that the ILO exercises have helped in reducing length of episodes and need to call ambulance. Reviewed ILO exercises with patient this session and completed VCD-Q PROM, which demonstrated subjective improvement in perception of symptoms. Pt has met her rehab goals and is agreeable to d/c. Educated on ENT referral and that she can  receive another referral to ST after objective imaging if necessary.    OBJECTIVE IMPAIRMENTS: include voice disorder and vocal cord dysfunction These impairments are limiting patient from effectively communicating at home and in community. Factors affecting potential to achieve goals and functional outcome are co-morbidities. Patient will benefit from skilled SLP services to address above impairments and improve overall function.  REHAB POTENTIAL: Good  PLAN:  SLP FREQUENCY: 1x/week  SLP DURATION: 4 weeks  PLANNED INTERVENTIONS: SLP instruction and feedback, Compensatory strategies, and Patient/family education    Leroy Libman, [place] 03/20/2023, 1:53 PM

## 2023-03-20 NOTE — Patient Instructions (Signed)
Visit Information  Thank you for taking time to visit with me today. Please don't hesitate to contact me if I can be of assistance to you.   Following are the goals we discussed today:   Goals Addressed             This Visit's Progress    Support with Health managment education       Interventions Today    Flowsheet Row Most Recent Value  Chronic Disease   Chronic disease during today's visit Chronic Obstructive Pulmonary Disease (COPD), Hypertension (HTN)  General Interventions   General Interventions Discussed/Reviewed General Interventions Reviewed, Doctor Visits  Doctor Visits Discussed/Reviewed Doctor Visits Discussed, PCP  PCP/Specialist Visits Compliance with follow-up visit  Education Interventions   Education Provided Provided Education, Provided Web-based Education  [HTN, Asthma]  Provided Verbal Education On Other  [instructed on how to check BP for most accurate reading. advised to continue to check and record BP and notify PCP if continues to remain elevated]  Pharmacy Interventions   Pharmacy Dicussed/Reviewed Pharmacy Topics Reviewed           Our next appointment is by telephone on 04/03/22 at 2 pm  Please call the care guide team at [phone] if you need to cancel or reschedule your appointment.   If you are experiencing a Mental Health or [place] or need someone to talk to, please call the Suicide and Crisis Lifeline: [place], RN, MSN, BSN, [place] [phone]

## 2023-03-20 NOTE — Telephone Encounter (Signed)
The pt has an appt for [place] limited tomorrow.  I have sent a message to the schedulers to see if she needs a new appt or if the order can be changed. I did enter the new order.   The pt has been advised that she will be called back with an answer

## 2023-03-20 NOTE — Patient Outreach (Signed)
  Care Coordination   Follow Up Visit Note   03/20/2023 Name: [name] MRN: [id number] DOB: 1961/03/08  [name] is a 62 y.o. year old female who sees Biagio Borg, MD for primary care. I spoke with  [name] by phone today.  What matters to the patients health and wellness today?  [name] reports she is attending speech therapy sessions for vocal chord dysfunction as recommended. And reports improvement in her breathing. She reports blood pressure 151/84 and expressed some stress this week.   Goals Addressed             This Visit's Progress    Support with Health managment education       Interventions Today    Flowsheet Row Most Recent Value  Chronic Disease   Chronic disease during today's visit Chronic Obstructive Pulmonary Disease (COPD), Hypertension (HTN)  General Interventions   General Interventions Discussed/Reviewed General Interventions Reviewed, Doctor Visits  Doctor Visits Discussed/Reviewed Doctor Visits Discussed, PCP  PCP/Specialist Visits Compliance with follow-up visit  Education Interventions   Education Provided Provided Education, Provided Web-based Education  [HTN, Asthma]  Provided Verbal Education On Other  [instructed on how to check BP for most accurate reading. advised to continue to check and record BP and notify PCP if continues to remain elevated]  Pharmacy Interventions   Pharmacy Dicussed/Reviewed Pharmacy Topics Reviewed           SDOH assessments and interventions completed:  No  Care Coordination Interventions:  Yes, provided   Follow up plan: Follow up call scheduled for 04/04/23    Encounter Outcome:  Pt. Visit Completed   Thea Silversmith, RN, MSN, BSN, [place] Coordinator [phone]

## 2023-03-21 ENCOUNTER — Ambulatory Visit (HOSPITAL_COMMUNITY)
Admission: RE | Admit: 2023-03-21 | Discharge: 2023-03-21 | Disposition: A | Discharge status: Home / Self Care | Payer: Medicare HMO | Source: Ambulatory Visit | Attending: Gastroenterology | Admitting: Gastroenterology

## 2023-03-21 DIAGNOSIS — R932 Abnormal findings on diagnostic imaging of liver and biliary tract: Secondary | ICD-10-CM | POA: Diagnosis not present

## 2023-03-21 DIAGNOSIS — K7460 Unspecified cirrhosis of liver: Secondary | ICD-10-CM | POA: Insufficient documentation

## 2023-03-21 DIAGNOSIS — K7689 Other specified diseases of liver: Secondary | ICD-10-CM | POA: Diagnosis not present

## 2023-04-01 ENCOUNTER — Ambulatory Visit (INDEPENDENT_AMBULATORY_CARE_PROVIDER_SITE_OTHER): Payer: Medicare HMO | Admitting: *Deleted

## 2023-04-01 DIAGNOSIS — J4550 Severe persistent asthma, uncomplicated: Secondary | ICD-10-CM | POA: Diagnosis not present

## 2023-04-01 NOTE — Therapy (Signed)
OUTPATIENT PHYSICAL THERAPY LOWER EXTREMITY EVALUATION   Patient Name: [name] MRN: [id number] DOB:[birth year], 62 y.o., female Today's Date: 04/02/2023  END OF SESSION:  PT End of Session - 04/02/23 1206     Visit Number 1    Number of Visits 13    Date for PT Re-Evaluation 05/18/23    Authorization Type humana MCR; MCD    PT Start Time 1147    PT Stop Time 1235    PT Time Calculation (min) 48 min    Activity Tolerance Patient tolerated treatment well    Behavior During Therapy WFL for tasks assessed/performed             Past Medical History:  Diagnosis Date   Anemia, iron deficiency 12/22/2014   pt. 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      Bipolar disorder    with anxiety, depression   COPD (chronic obstructive pulmonary disease)    Eczema 05/18/2022   Fibromyalgia 05/14/2014   GERD (gastroesophageal reflux disease)    History of kidney stones    Hyperlipidemia 04/20/2017   HYPERTENSION 05/12/2009   Qualifier: Diagnosis of  By: Truman Hayward Duncan Dull), [name]     Peripheral vascular disease    Pneumonia    Prediabetes 02/23/2014   pt. denies   Seizure    Stroke 11/2020   Urticaria    Past Surgical History:  Procedure Laterality Date   ABDOMINAL HYSTERECTOMY     ANTERIOR CERVICAL DECOMP/DISCECTOMY FUSION N/A 07/28/2020   Procedure: ANTERIOR CERVICAL DECOMPRESSION/DISCECTOMY FUSION. INTERBODY PROTHESIS, PLATE/SCREWS CERVICAL THREE- CERVICAL FOUR, CERVICAL FOUR- CERVICAL FIVE;  Surgeon: Tressie Stalker, MD;  Location: [facility] OR;  Service: Neurosurgery;  Laterality: N/A;   BACK SURGERY     COLONOSCOPY  12/20/2011   Procedure: COLONOSCOPY;  Surgeon: Freddy Jaksch, MD;  Location: [facility];  Service: Endoscopy;  Laterality: N/A;   COLONOSCOPY  03/05/2012    Procedure: COLONOSCOPY;  Surgeon: Freddy Jaksch, MD;  Location: [facility];  Service: Endoscopy;  Laterality: N/A;   DIAGNOSTIC LAPAROSCOPY     HEMORRHOID SURGERY     INCISE AND DRAIN ABCESS     KIDNEY STONE SURGERY     NECK SURGERY     x 2 Dr Terrilee Files   SPINE SURGERY  2019   TOE SURGERY     TUBAL LIGATION     Patient Active Problem List   Diagnosis Date Noted   Cirrhosis 03/07/2023   Vocal cord dysfunction 03/05/2023   Urinary incontinence 01/25/2023   Influenza A 01/11/2023   Bilateral impacted cerumen 12/04/2022   Not well controlled severe persistent asthma 10/25/2022   Acute on chronic respiratory failure with hypoxemia 10/07/2022   Abnormal TSH 09/13/2022   Chronic sinusitis 08/14/2022   PND (paroxysmal nocturnal dyspnea) 06/07/2022   Vitamin D deficiency 05/20/2022   Eczema 05/18/2022   Insomnia 05/18/2022   Allergic conjunctivitis 05/18/2022   Chronic respiratory failure with hypoxia and hypercapnia 04/30/2022   Bilateral hearing loss 04/14/2022   Moderate persistent asthma without complication 02/02/2022   Low back pain 10/11/2021   Abnormal CXR 07/17/2021   Aortic atherosclerosis 07/05/2021   URI (upper respiratory infection) 07/05/2021   Arthrodesis status 06/20/2021   Drug allergy 05/14/2021   Acute cough 05/14/2021   Bilateral leg weakness 04/17/2021   Status  post cervical spinal fusion 12/27/2020   History of stroke 12/11/2020   Medication management 10/21/2020   Body mass index (BMI) 26.0-26.9, adult 08/23/2020   Healthcare maintenance 08/11/2020   Cervical spondylosis with myelopathy and radiculopathy 07/28/2020   Preop exam for internal medicine 06/19/2020   Hypotension due to medication 06/11/2020   Essential (primary) hypertension 05/17/2020   Other spondylosis with myelopathy, cervical region 02/16/2020   Cervical spondylosis 02/02/2020   Radiculopathy, cervical region 02/02/2020   Spondylolysis, cervical region 02/02/2020   Neck pain  02/02/2020   Leg cramping 08/12/2019   Laryngopharyngeal reflux (LPR) 05/26/2019   Sinus pressure 05/26/2019   Hypophosphatemia 01/07/2019   Vertigo 09/23/2018   Dysfunction of left eustachian tube 09/23/2018   Gait disorder 04/21/2018   Leukocytosis 04/15/2018   Nausea & vomiting 04/15/2018   Seizure disorder    Prolapsed cervical intervertebral disc 03/11/2018   Cervical radiculopathy 02/18/2018   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   Syncope 09/04/2017   Constipation 09/04/2017   Nonintractable headache 09/04/2017   Leg swelling 07/26/2017   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   Hyperlipidemia 04/20/2017   Pedal edema 04/19/2017   Bipolar I disorder 09/19/2016   Facial pain 07/12/2016   Rash 06/14/2016   Migraine 01/25/2016   Idiopathic urticaria/pruritus 01/23/2016   Allergic rhinitis 01/23/2016   Oral candidiasis 01/23/2016   Greater trochanteric bursitis of both hips 09/08/2015   Chest pain 06/22/2015   Itching 06/22/2015   Bilateral knee pain 06/22/2015   Bilateral hip pain 06/22/2015   Anemia, iron deficiency 12/22/2014   Fatigue 12/21/2014   Rash and nonspecific skin eruption 08/24/2014   Intertrigo 08/07/2014   Dyspareunia 07/12/2014   Menopausal syndrome (hot flushes) 07/12/2014   History of tobacco abuse 07/12/2014   Dizziness 06/22/2014   Weakness 06/22/2014   Fibromyalgia 05/14/2014   Encounter for well adult exam with abnormal findings 04/22/2014   Recurrent boils 04/22/2014   Recurrent falls 04/22/2014   Peripheral vascular disease    Depression    Anxiety    Gastroesophageal reflux disease    Prediabetes 02/23/2014   COPD exacerbation 02/23/2014   Screening mammogram for high-risk patient 02/23/2014   Back pain 07/22/2013   COPD/asthma 08/24/2009   Headache(784.0) 08/24/2009   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    PCP: Corwin Levins, MD  REFERRING PROVIDER:  Corwin Levins, MD (fall,gait) Kathryne Hitch, MD (hip, knee)   REFERRING DIAG:  R29.6 (ICD-10-CM) - Recurrent falls  R26.9 (ICD-10-CM) - Gait disorder   Diagnosis  M25.562,G89.29 (ICD-10-CM) - Chronic pain of left knee  M25.561,G89.29 (ICD-10-CM) - Chronic pain of right knee  M25.551,M25.552 (ICD-10-CM) - Bilateral hip pain    THERAPY DIAG:  Repeated falls  Muscle weakness (generalized)  Chronic pain of both knees  Pain of both hip joints  Rationale for Evaluation and Treatment: Rehabilitation  ONSET DATE: chronic   SUBJECTIVE:   SUBJECTIVE STATEMENT: Patient reports history of 3 falls in the past 6 months. She reports most recent fall was about a month ago when she was walking and her knees just gave out on her causing her to fall. She denies any injury from the falls. She feels that her knees have been giving out on her for over a year. She reports chronic bilateral knee and hip pain that have been hurting for years. She feels the pain is deep in the knees and hips. She reports occasional popping in the knees that can be painful. She saw ortho for her knees/hips who suggested  PT. She reports severe asthma having been hospitalized for this issue multiple times. She reports her asthma keeps her from walking even short distances.   PERTINENT HISTORY: Severe asthma  COPD Bipolar Fibromyalgia History of stroke History of seizure, reports she has not experienced a seizure in over 2 years  PAIN:  Are you having pain? None currentlyYes: NPRS scale: 10 at worst/10 Pain location: bilateral knees and hips Pain description: sharp Aggravating factors: worse in the AM Relieving factors: medication  PRECAUTIONS: Fall  WEIGHT BEARING RESTRICTIONS: No  FALLS:  Has patient fallen in last 6 months? Yes. Number of falls 3 (reports that her knees have given out on her causing her to fall when walking and with transfers)  LIVING ENVIRONMENT: Lives with: lives alone Lives in: House/apartment Stairs: No Has following equipment at home: Single  point cane and Environmental consultant - 2 wheeled  OCCUPATION: disability   PLOF:  per patient when her breathing is bad her grandson has to help  PATIENT GOALS: "get my hips and legs together where I can walk good."    OBJECTIVE:   DIAGNOSTIC FINDINGS:  Lt knee X-ray: 2 views of the left knee show normal alignment no acute findings.  There  is no effusion.   Rt knee X-ray: 2 views of the right knee are normal.  The alignment is normal and the  joint spaces well-maintained.   PATIENT SURVEYS:  FOTO 27% function to 52% predicted   COGNITION: Overall cognitive status: Within functional limits for tasks assessed     SENSATION: Not tested  EDEMA:  No obvious swelling about bilateral knees   MUSCLE LENGTH: Hamstrings: mild tightness bilaterally    POSTURE: not formally assessed   PALPATION: Diffuse tenderness about anterior knees   LOWER EXTREMITY ROM:  Active ROM Right eval Left eval  Hip flexion WNL WNL  Hip extension    Hip abduction    Hip adduction    Hip internal rotation    Hip external rotation    Knee flexion Full Full   Knee extension Full Full  Ankle dorsiflexion    Ankle plantarflexion    Ankle inversion    Ankle eversion     (Blank rows = not tested)  LOWER EXTREMITY MMT:  MMT Right eval Left eval  Hip flexion 4 4  Hip extension Unable to lift leg in prone (activation noted) Unable to lift leg in prone (activation noted)   Hip abduction 4- 4-  Hip adduction    Hip internal rotation    Hip external rotation    Knee flexion 5 4+  Knee extension 5 4+  Ankle dorsiflexion    Ankle plantarflexion    Ankle inversion    Ankle eversion     (Blank rows = not tested)  LOWER EXTREMITY SPECIAL TESTS:  (+) McMurrays and Thessaly's RLE  (-) Anterior Drawer, Posterior Drawer bilaterally  (+) [place] bilaterally   FUNCTIONAL TESTS:  5 x STS: 21 seconds  SLS: RLE 11 seconds, LLE 30 seconds  DGI: 20/24  GAIT: Distance walked: 10 ft Assistive device utilized:  None Level of assistance: Complete Independence Comments: WNL   [facility] Adult PT Treatment:                                                DATE: 04/02/23 Therapeutic Exercise: Demonstrated and issue initial HEP.  Therapeutic Activity: Education on assessment findings that will be addressed throughout duration of POC.       PATIENT EDUCATION:  Education details: see treatment  Person educated: Patient Education method: Explanation, Demonstration, Tactile cues, Verbal cues, and Handouts Education comprehension: verbalized understanding, returned demonstration, verbal cues required, tactile cues required, and needs further education  HOME EXERCISE PROGRAM: Access Code: [email or web address] URL: [email or web address] Date: 04/02/2023 Prepared by: Letitia Libra  Exercises - Supine Bridge  - 1 x daily - 7 x weekly - 2 sets - 10 reps - Supine Active Straight Leg Raise  - 1 x daily - 7 x weekly - 2 sets - 10 reps - Hooklying Clamshell with Resistance  - 1 x daily - 7 x weekly - 2 sets - 10 reps  ASSESSMENT:  CLINICAL IMPRESSION: Patient is a 62 y.o. female who was seen today for physical therapy evaluation and treatment for history of repeated falls and chronic hip/knee pain. She reports that the cause of her falls is from her knees giving away. Upon assessment she is noted to have bilateral hip/knee weakness, balance impairments, and positive meniscal special tests on the RLE. She scores at a low fall risk based on the DGI, but her 5 x STS score suggests an increase in fall risk. She will benefit from skilled PT to address the above stated deficits in order to optimize her function and reduce her risk of future falls.   OBJECTIVE IMPAIRMENTS: decreased activity tolerance, decreased balance, decreased endurance, decreased knowledge of condition, difficulty walking, decreased strength, impaired flexibility, improper body mechanics, and pain.   ACTIVITY LIMITATIONS: carrying, lifting,  bending, standing, squatting, transfers, and locomotion level  PARTICIPATION LIMITATIONS: meal prep, cleaning, laundry, shopping, community activity, and yard work  PERSONAL FACTORS: Age, Fitness, Time since onset of injury/illness/exacerbation, and 3+ comorbidities: see PMH above  are also affecting patient's functional outcome.   REHAB POTENTIAL: Good  CLINICAL DECISION MAKING: Evolving/moderate complexity  EVALUATION COMPLEXITY: Moderate   GOALS: Goals reviewed with patient? Yes  SHORT TERM GOALS: Target date: 04/23/2023   Patient will be independent and compliant with initial HEP.   Baseline: issued at eval Goal status: INITIAL  2.  Patient will complete 5 x STS in </ 15 seconds to improve her functional strength.  Baseline: see above  Goal status: INITIAL  3.  Patient will maintain Rt SLS for at least 20 seconds to improve stability when navigating on uneven terrain.  Baseline: see above  Goal status: INITIAL  LONG TERM GOALS: Target date: 05/18/23    Patient will report no instances of knee giving out when walking for at least 4 weeks, indicative of improvement in her current condition.  Baseline: random occurrences.  Goal status: INITIAL  2.  Patient will demonstrate at least 3+/5 bilateral hip extensor strength to improve stability about the chain with walking.  Baseline: see above  Goal status: INITIAL  3.  Patient will score at least 52% on FOTO to signify clinically meaningful improvement in functional abilities.   Baseline: see above  Goal status: INITIAL  4.  Patient will be able to complete step up from 4 inch step independently to improve safety with navigating curbs in the community.  Baseline: requires assistance Goal status: INITIAL  5.  Patient will be independent with advanced home program to progress/maintain current level of function.  Baseline: initial HEP issued  Goal status: INITIAL    PLAN:  PT FREQUENCY: 1-2x/week  PT DURATION: 6  weeks  PLANNED INTERVENTIONS: Therapeutic  exercises, Therapeutic activity, Neuromuscular re-education, Balance training, Gait training, Patient/Family education, Self Care, Joint mobilization, Dry Needling, Cryotherapy, Moist heat, Manual therapy, and Re-evaluation  PLAN FOR NEXT SESSION: review and progress HEP prn; hip/knee strengthening, balance. Be mindful of patient's severe asthma   Letitia Libra, PT, DPT, ATC 04/02/23 1:14 PM  Referring diagnosis?   R29.6 (ICD-10-CM) - Recurrent falls  R26.9 (ICD-10-CM) - Gait disorder   Diagnosis  M25.562,G89.29 (ICD-10-CM) - Chronic pain of left knee  M25.561,G89.29 (ICD-10-CM) - Chronic pain of right knee  M25.551,M25.552 (ICD-10-CM) - Bilateral hip pain   Treatment diagnosis? (if different than referring diagnosis)   Repeated falls  Muscle weakness (generalized)  Chronic pain of both knees  Pain of both hip joints What was this (referring dx) caused by?  Surgery  Fall  Ongoing issue  Arthritis  Other: ____________  Laterality:  Rt  Lt  Both  Check all possible CPT codes:  *CHOOSE 10 OR LESS*     97110 (Therapeutic Exercise)   40347 (SLP Treatment)   97112 (Neuro Re-ed)    92526 (Swallowing Treatment)    97116 (Gait Training)    [id number] (Cognitive Training, 1st 15 minutes)  97140 (Manual Therapy)    97130 (Cognitive Training, each add'l 15 minutes)   97164 (Re-evaluation)                               Other, List CPT Code ____________   97530 (Therapeutic Activities)      97535 (Self Care)    All codes above (97110 - 97535)   97012 (Mechanical Traction)   97014 (E-stim Unattended)   97032 (E-stim manual)   97033 (Ionto)   97035 (Ultrasound)  97750 (Physical Performance Training)  [id number] (Aquatic Therapy)  97016 (Vasopneumatic Device)  [id number] (Paraffin)  97034 (Contrast Bath)  97597 (Wound Care 1st 20 sq cm)  97598 (Wound Care each add'l 20 sq cm)   97760 (Orthotic Fabrication, Fitting, Training Initial)  [id number] (Prosthetic Management and Training Initial)  [id number] (Orthotic or Prosthetic Training/ Modification Subsequent)

## 2023-04-02 ENCOUNTER — Other Ambulatory Visit: Payer: Self-pay

## 2023-04-02 ENCOUNTER — Ambulatory Visit: Payer: Medicare HMO

## 2023-04-02 DIAGNOSIS — M25561 Pain in right knee: Secondary | ICD-10-CM | POA: Diagnosis not present

## 2023-04-02 DIAGNOSIS — M6281 Muscle weakness (generalized): Secondary | ICD-10-CM

## 2023-04-02 DIAGNOSIS — M25551 Pain in right hip: Secondary | ICD-10-CM

## 2023-04-02 DIAGNOSIS — R490 Dysphonia: Secondary | ICD-10-CM | POA: Diagnosis not present

## 2023-04-02 DIAGNOSIS — M25552 Pain in left hip: Secondary | ICD-10-CM | POA: Diagnosis not present

## 2023-04-02 DIAGNOSIS — G8929 Other chronic pain: Secondary | ICD-10-CM | POA: Diagnosis not present

## 2023-04-02 DIAGNOSIS — R296 Repeated falls: Secondary | ICD-10-CM | POA: Diagnosis not present

## 2023-04-02 DIAGNOSIS — J383 Other diseases of vocal cords: Secondary | ICD-10-CM | POA: Diagnosis not present

## 2023-04-02 DIAGNOSIS — M25562 Pain in left knee: Secondary | ICD-10-CM | POA: Diagnosis not present

## 2023-04-04 ENCOUNTER — Ambulatory Visit: Payer: Self-pay

## 2023-04-04 ENCOUNTER — Telehealth: Payer: Self-pay

## 2023-04-04 NOTE — Patient Outreach (Signed)
  Care Coordination   Follow Up Visit Note   04/04/2023 Name: [name] MRN: [id number] DOB: 1961-07-09  [name] is a 62 y.o. year old female who sees Corwin Levins, MD for primary care. I spoke with  [name] by phone today.  What matters to the patients health and wellness today?  [name] states she continues with outpatient therapy with PT. She states speech therapist recommended she see a different ENT. She is going to follow up with speech therapist to see if this has been completed. Per [name], she states the last week she has had to use her rescue inhaler and/or nebulizer about every day. She does not feel she is improving. Appointment with asthma specialist scheduled for 04/16/23. Next pulmonary appointment scheduled for 05/06/23.   Goals Addressed             This Visit's Progress    Support with Health managment education       Interventions Today    Flowsheet Row Most Recent Value  Chronic Disease   Chronic disease during today's visit Chronic Obstructive Pulmonary Disease (COPD), Other  [asthma]  General Interventions   General Interventions Discussed/Reviewed General Interventions Reviewed, Doctor Visits  Doctor Visits Discussed/Reviewed Doctor Visits Discussed, Specialist  PCP/Specialist Visits Compliance with follow-up visit  [advised patient to contact pulmonologist/asthma specialist to try to be a sooner appointment.]  Education Interventions   Education Provided Provided Education, Provided Web-based Education  [COPD action plan,  asthma attack prevention]  Provided Verbal Education On Other, When to see the doctor  [discussed finding possible triggers in her home that could be negatively impacting her condition]  Pharmacy Interventions   Pharmacy Dicussed/Reviewed Pharmacy Topics Reviewed, Medications and their functions, Medication Adherence, Affording Medications           SDOH assessments  and interventions completed:  No  Care Coordination Interventions:  No, not indicated   Follow up plan: Follow up call scheduled for 05/04/23    Encounter Outcome:  Pt. Visit Completed   Felicia Sheriff, RN, MSN, BSN, CCM [facility] Care Coordinator [phone]

## 2023-04-04 NOTE — Patient Instructions (Addendum)
Visit Information  Thank you for taking time to visit with me today. Please don't hesitate to contact me if I can be of assistance to you.   Following are the goals we discussed today:   Goals Addressed             This Visit's Progress    Support with Health managment education       Interventions Today    Flowsheet Row Most Recent Value  Chronic Disease   Chronic disease during today's visit Chronic Obstructive Pulmonary Disease (COPD), Other  [asthma]  General Interventions   General Interventions Discussed/Reviewed General Interventions Reviewed, Doctor Visits  Doctor Visits Discussed/Reviewed Doctor Visits Discussed, Specialist  PCP/Specialist Visits Compliance with follow-up visit  [advised patient to contact pulmonologist/asthma specialist to try to be a sooner appointment.]  Education Interventions   Education Provided Provided Education, Provided Web-based Education  [COPD action plan,  asthma attack prevention]  Provided Verbal Education On Other, When to see the doctor  [discussed finding possible triggers in her home that could be negatively impacting her condition]  Pharmacy Interventions   Pharmacy Dicussed/Reviewed Pharmacy Topics Reviewed, Medications and their functions, Medication Adherence, Affording Medications           Our next appointment is by telephone on 05/04/23 at 2:00 pm  Please call the care guide team at [phone] if you need to cancel or reschedule your appointment.   If you are experiencing a Mental Health or Behavioral Health Crisis or need someone to talk to, please call the Suicide and Crisis Lifeline: 72  Kathyrn Sheriff, RN, MSN, BSN, CCM [facility] Care Coordinator [phone]   COPD Action Plan A COPD action plan is a description of what to do when you have a flare (exacerbation) of chronic obstructive pulmonary disease (COPD). Your action plan is a color-coded plan that lists the symptoms that indicate whether your condition is under  control and what actions to take. If you have symptoms in the green zone, it means you are doing well that day. If you have symptoms in the yellow zone, it means you are having a bad day or an exacerbation. If you have symptoms in the red zone, you need urgent medical care. Follow the plan that you and your health care provider developed. Review your plan with your health care provider at each visit. Red zone Symptoms in this zone mean that you should get medical help right away. They include: Feeling very short of breath, even when you are resting. Not being able to do any activities because of poor breathing. Not being able to sleep because of poor breathing. Fever or shaking chills. Feeling confused or very sleepy. Chest pain. Coughing up blood. If you have any of these symptoms, call emergency services (911 in the [place] or go to the nearest emergency room. Yellow zone Symptoms in this zone mean that your condition may be getting worse. They include: Feeling more short of breath than usual. Having less energy for daily activities than usual. Phlegm or mucus that is thicker than usual. Needing to use your rescue inhaler or nebulizer more often than usual. More ankle swelling than usual. Coughing more than usual. Feeling like you have a chest cold. Trouble sleeping due to COPD symptoms. Decreased appetite. COPD medicines not helping as much as usual. If you experience any "yellow" symptoms: Keep taking your daily medicines as directed. Use your quick-relief inhaler as told by your health care provider. If you were prescribed steroid medicine to take  by mouth (oral medicine), start taking it as told by your health care provider. If you were prescribed an antibiotic medicine, start taking it as told by your health care provider. Do not stop taking the antibiotic even if you start to feel better. Use oxygen as told by your health care provider. Get more rest. Do your pursed-lip  breathing exercises. Do not smoke. Avoid any irritants in the air. If your signs and symptoms do not improve after taking these steps, call your health care provider right away. Green zone Symptoms in this zone mean that you are doing well. They include: Being able to do your usual activities and exercise. Having the usual amount of coughing, including the same amount of phlegm or mucus. Being able to sleep well. Having a good appetite. Where to find more information: You can find more information about COPD from: American Lung Association, My COPD Action Plan: www.lung.org COPD Foundation: www.copdfoundation.org National Heart, Lung, & Blood Institute: PopSteam.is Follow these instructions at home: Continue taking your daily medicines as told by your health care provider. Make sure you receive all the immunizations that your health care provider recommends, especially the pneumococcal and influenza vaccines. Wash your hands often with soap and water. Have family members wash their hands too. Regular hand washing can help prevent infections. Follow your usual exercise and diet plan. Avoid irritants in the air, such as smoke. Do not use any products that contain nicotine or tobacco. These products include cigarettes, chewing tobacco, and vaping devices, such as e-cigarettes. If you need help quitting, ask your health care provider. Summary A COPD action plan tells you what to do when you have a flare (exacerbation) of chronic obstructive pulmonary disease (COPD). Follow each action plan for your symptoms. If you have any symptoms in the red zone, call emergency services (911 in the [place] or go to the nearest emergency room. This information is not intended to replace advice given to you by your health care provider. Make sure you discuss any questions you have with your health care provider. Document Revised: 10/11/2020 Document Reviewed: 10/11/2020 Elsevier Patient Education  2023  Elsevier Inc.   Asthma Attack Prevention, Adult Although you may not be able to change the fact that you have asthma, you can take actions to prevent episodes of asthma (asthma attacks). How can this condition affect me? Asthma attacks (flare ups) can cause trouble breathing, high-pitched whistling sounds when you breathe, most often when you breathe out (wheeze), and coughing. They may keep you from doing activities you like to do. What can increase my risk? Coming into contact with things that cause asthma symptoms (asthma triggers) can put you at risk for an asthma attack. Common asthma triggers include: Things you are allergic to (allergens), such as: Dust mite and cockroach droppings. Pet dander. Mold. Pollen from trees and grasses. Food allergies. This might be a specific food or added chemicals called sulfites. Irritants, such as: Weather changes including very cold, dry, or humid air. Smoke. This includes campfire smoke, air pollution, and tobacco smoke. Strong odors from aerosol sprays and fumes from perfume, candles, and household cleaners. Other triggers, such as: Certain medicines. This includes NSAIDs, such as ibuprofen and aspirin. Viral respiratory infections (colds), including runny nose (rhinitis) or infection in the sinuses (sinusitis). Activity, including exercise, laughing, or crying. Not using inhaled medicines (corticosteroids) as told. What actions can I take to prevent an asthma attack? Stay healthy. Stay up to date on all immunizations as told by your  health care provider, including the yearly flu (influenza) vaccine and pneumonia vaccine. Many asthma attacks can be prevented by carefully following your written asthma action plan. Follow your asthma action plan Work with your health care provider to create a written asthma action plan. This plan should include: A list of your asthma triggers and how to avoid or reduce them. A list of symptoms that you may have  during an asthma attack. Information about which medicine to take, when to take the medicine, and how much of the medicine to take. Information to help you understand your peak flow measurements. Daily actions that you can take to prevent (control) your asthma symptoms. Contact information for your health care providers. If you have an asthma attack, act quickly. Follow the emergency steps on your written asthma action plan. This may prevent you from needing to go to the hospital. Monitor your asthma. To do this: Use your peak flow meter every morning and every evening for 2-3 weeks or as told by your health care provider. Record the results in a journal. A drop in your peak flow numbers on one or more days may mean that you are starting to have an asthma attack, even if you are not having symptoms. When you have asthma symptoms, write them down in a journal. Write down in your journal how often you need to use your fast-acting rescue inhaler. If you are using your rescue inhaler more often, it may mean that your asthma is not under control. Talk with your health care provider about adjusting your asthma treatment plan to help you prevent future asthma attacks and gain better control of your condition.  Lifestyle Avoid or reduce contact with known outdoor allergens by staying indoors, keeping windows closed, and using air conditioning when pollen and mold counts are high. Do not use any products that contain nicotine or tobacco. These products include cigarettes, chewing tobacco, and vaping devices, such as e-cigarettes. If you need help quitting, ask your health care provider. If you are overweight, consider a weight-loss plan. Find ways to cope with stress and your feelings, such as mindfulness, relaxation, or breathing exercises. Ask your health care provider if a breathing exercise program (pulmonary rehabilitation) may be helpful to control symptoms and improve your quality of  life. Medicines  Take over-the-counter and prescription medicines only as told by your health care provider. Do not stop taking your medicine and do not take less medicine even if you start to feel better. Let your health care provider know: How often you use your rescue inhaler. How often you have symptoms when you are taking your regular medicines. If you wake up at night because of asthma symptoms. If you have more trouble with your breathing when you exercise. Activity Do your normal activities as told by your health care provider. Ask your health care provider what activities are safe for you. Some people have asthma symptoms or more asthma symptoms when they exercise. This is called exercise-induced bronchoconstriction (EIB). If you have this problem, talk with your health care provider about how to manage EIB. Some tips to follow include: Use your fast-acting inhaler before exercise. Exercise indoors if it is very cold, humid, or the pollen and mold counts are high. Warm up and cool down before and after exercise. Stop exercising right away if your asthma symptoms start or get worse. Where to find more information Asthma and Allergy Foundation of America: www.aafa.org Centers for Disease Control and Prevention: FootballExhibition.com.br American Lung Association: www.lung.org  National Heart, Lung, and Blood Institute: PopSteam.is World Health Organization: https://castaneda-walker.com/ Get help right away if: You have followed your written asthma action plan and your symptoms are not improving. Summary Asthma attacks (flare ups) can cause trouble breathing, high-pitched whistling sounds when you breathe, most often when you breathe out (wheeze), and coughing. Work with your health care provider to create a written asthma action plan. Do not stop taking your medicine and do not take less medicine even if you are feeling better. Do not use any products that contain nicotine or tobacco. These products include  cigarettes, chewing tobacco, and vaping devices, such as e-cigarettes. If you need help quitting, ask your health care provider. This information is not intended to replace advice given to you by your health care provider. Make sure you discuss any questions you have with your health care provider. Document Revised: 05/31/2021 Document Reviewed: 05/31/2021 Elsevier Patient Education  2023 [employer].

## 2023-04-04 NOTE — Patient Outreach (Signed)
  Care Coordination   04/04/2023 Name: [name] MRN: [id number] DOB: 14-Jul-1961   Care Coordination Outreach Attempts:  An unsuccessful telephone outreach was attempted for a scheduled appointment today.  Follow Up Plan:  Additional outreach attempts will be made to offer the patient care coordination information and services.   Encounter Outcome:  No Answer   Care Coordination Interventions:  No, not indicated    Kathyrn Sheriff, RN, MSN, BSN, CCM [facility] Care Coordinator [phone]

## 2023-04-08 ENCOUNTER — Other Ambulatory Visit: Payer: Self-pay

## 2023-04-08 MED ORDER — FERROUS SULFATE 325 (65 FE) MG PO TBEC: 325.0000 mg | DELAYED_RELEASE_TABLET | Freq: Every day | ORAL | 1 refills | Status: DC

## 2023-04-09 ENCOUNTER — Ambulatory Visit (INDEPENDENT_AMBULATORY_CARE_PROVIDER_SITE_OTHER): Payer: Medicare HMO

## 2023-04-09 DIAGNOSIS — J309 Allergic rhinitis, unspecified: Secondary | ICD-10-CM | POA: Diagnosis not present

## 2023-04-15 ENCOUNTER — Encounter: Payer: Self-pay | Admitting: Obstetrics & Gynecology

## 2023-04-15 ENCOUNTER — Ambulatory Visit (INDEPENDENT_AMBULATORY_CARE_PROVIDER_SITE_OTHER): Payer: Medicare HMO | Admitting: Obstetrics & Gynecology

## 2023-04-15 VITALS — BP 106/82 | HR 97

## 2023-04-15 DIAGNOSIS — B3731 Acute candidiasis of vulva and vagina: Secondary | ICD-10-CM

## 2023-04-15 DIAGNOSIS — N898 Other specified noninflammatory disorders of vagina: Secondary | ICD-10-CM | POA: Diagnosis not present

## 2023-04-15 DIAGNOSIS — Z113 Encounter for screening for infections with a predominantly sexual mode of transmission: Secondary | ICD-10-CM

## 2023-04-15 DIAGNOSIS — N760 Acute vaginitis: Secondary | ICD-10-CM | POA: Diagnosis not present

## 2023-04-15 LAB — WET PREP FOR TRICH, YEAST, CLUE

## 2023-04-15 MED ORDER — FLUCONAZOLE 150 MG PO TABS: 150.0000 mg | ORAL_TABLET | ORAL | 0 refills | Status: AC

## 2023-04-15 MED ORDER — TINIDAZOLE 500 MG PO TABS: 1000.0000 mg | ORAL_TABLET | Freq: Two times a day (BID) | ORAL | 0 refills | Status: AC

## 2023-04-15 NOTE — Progress Notes (Signed)
    [name] 04/10/1961 [id number]        62 y.o.  [id number] Stable boyfriend x 30 yrs  RP: External itching/irritation and burning/dryness  HPI: C/O external itching/irritation and burning/dryness. Denies any odor or vaginal discharge. States she had intercourse this past Thursday and it was painful even with [place].  Had not been sexually active for a while before that.  Has been miserable since. No specific urinary sxs. States has bathed in vinegar.  S/P Total Hysterectomy.    OB History  Gravida Para Term Preterm AB Living  7 3 3   4 3   SAB IAB Ectopic Multiple Live Births  3 1          # Outcome Date GA Lbr Len/2nd Weight Sex Delivery Anes PTL Lv  7 IAB           6 SAB           5 SAB           4 SAB           3 Term           2 Term           1 Term             Past medical history,surgical history, problem list, medications, allergies, family history and social history were all reviewed and documented in the EPIC chart.   Directed ROS with pertinent positives and negatives documented in the history of present illness/assessment and plan.  Exam:  Vitals:   04/15/23 1612  BP: 106/82  Pulse: 97  SpO2: 95%   General appearance:  Normal  Gynecologic exam: Vulva with mild irritation.  Speculum:  S/P Total Hysterectomy. Vagina normal.  Increased thick discharge.  Wet prep and SureSwab Advanced Plus done.  Wet prep: Yeasts present.  Clue cells with odor present.   Assessment/Plan:  62 y.o. [id number]   1. Vaginal itching external itching/irritation and burning/dryness. Denies any odor or vaginal discharge. States she had intercourse this past Thursday and it was painful even with [name] but has been miserable since. No specific urinary sxs. States has bathed in vinegar. S/P Total Hysterectomy. Yeast vaginitis and Bacterial Vaginosis confirmed on Wet prep.  Will treat with Tinidazole and Fluconazole.  Usage reviewed, prescriptions sent to pharmacy. - WET PREP FOR  TRICH, YEAST, CLUE - SureSwab Advanced Vaginitis Plus,TMA  2. Screen for STD (sexually transmitted disease) Gono-Chlam done on SureSwab Advanced Plus. - SureSwab Advanced Vaginitis Plus,TMA  Other orders - levocetirizine (XYZAL) 5 MG tablet; Take 5 mg by mouth daily.  - tinidazole (TINDAMAX) 500 MG tablet; Take 2 tablets (1,000 mg total) by mouth 2 (two) times daily for 2 days. - fluconazole (DIFLUCAN) 150 MG tablet; Take 1 tablet (150 mg total) by mouth every other day for 3 doses.   [name], 4:19 PM 04/15/2023

## 2023-04-16 ENCOUNTER — Other Ambulatory Visit: Payer: Self-pay

## 2023-04-16 ENCOUNTER — Ambulatory Visit: Payer: Medicare HMO | Admitting: Diagnostic Neuroimaging

## 2023-04-16 ENCOUNTER — Encounter: Payer: Self-pay | Admitting: Allergy & Immunology

## 2023-04-16 ENCOUNTER — Ambulatory Visit: Payer: Medicare HMO

## 2023-04-16 ENCOUNTER — Encounter: Payer: Self-pay | Admitting: Diagnostic Neuroimaging

## 2023-04-16 ENCOUNTER — Telehealth: Payer: Self-pay | Admitting: *Deleted

## 2023-04-16 ENCOUNTER — Ambulatory Visit (INDEPENDENT_AMBULATORY_CARE_PROVIDER_SITE_OTHER): Payer: Medicare HMO | Admitting: Allergy & Immunology

## 2023-04-16 VITALS — BP 142/88 | HR 80 | Temp 97.9°F | Resp 18

## 2023-04-16 DIAGNOSIS — R079 Chest pain, unspecified: Secondary | ICD-10-CM | POA: Diagnosis not present

## 2023-04-16 DIAGNOSIS — J4550 Severe persistent asthma, uncomplicated: Secondary | ICD-10-CM

## 2023-04-16 DIAGNOSIS — R0602 Shortness of breath: Secondary | ICD-10-CM | POA: Diagnosis not present

## 2023-04-16 LAB — SURESWAB® ADVANCED VAGINITIS PLUS,TMA
C. trachomatis RNA, TMA: NOT DETECTED
CANDIDA SPECIES: DETECTED — AB
Candida glabrata: NOT DETECTED
N. gonorrhoeae RNA, TMA: NOT DETECTED
SURESWAB(R) ADV BACTERIAL VAGINOSIS(BV),TMA: NEGATIVE
TRICHOMONAS VAGINALIS (TV),TMA: NOT DETECTED

## 2023-04-16 LAB — D-DIMER, QUANTITATIVE: D-DIMER: 0.23 mg/L FEU (ref 0.00–0.49)

## 2023-04-16 MED ORDER — HYDROXYZINE HCL 25 MG PO TABS: 50.0000 mg | ORAL_TABLET | Freq: Every day | ORAL | 3 refills | Status: AC

## 2023-04-16 NOTE — Telephone Encounter (Signed)
Thank you for calling her

## 2023-04-16 NOTE — Patient Instructions (Addendum)
1. Asthma-COPD overlap syndrome - Lung testing not done.  - We are going to get a D-dimer to see if you have a pulmonary embolism (we will cal lyou - Daily controller medication(s): Dupixent every two weeks and Breztri two puffs twice daily - Prior to physical activity: Ventolin (GRAY INHALER) 2 puffs 10-15 minutes before physical activity. - Rescue medications: Ventolin (GRAY INHALER) 2 puffs AND 2 puffs Atrovent (GREEN INHALER) every 4-6 hours as needed - Asthma control goals:  * Full participation in all desired activities (may need albuterol before activity) * Albuterol use two time or less a week on average (not counting use with activity) * Cough interfering with sleep two time or less a month * Oral steroids no more than once a year * No hospitalizations   2. Chronic urticaria - under good control - Continue with levocetirizine twice daily EVERY DAY FOR NOW.  - Continue with famotidine twice daily AS NEEDED.  - Continue with Doxepin 25mg  at night to help with itching AS NEEDED.   3. Itching - Start hydroxyzine 50mg  every night to see if this can help with itching. - INCREASE Xyzal to one tablet twice daily.   4. 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 - Continue with allergy shots at the same schedule (come back next week so you start your allergy shots again).   5. Follow up in 1-2 months or earlier if needed.    Please inform us of any Emergency Department visits, hospitalizations, or changes in symptoms. Call us before going to the ED for breathing or allergy symptoms since we might be able to fit you in for a sick visit. Feel free to contact us anytime with any questions, problems, or concerns.  It was a pleasure to see you again today!  Websites that have reliable patient information:  1. American Academy of Asthma, Allergy, and Immunology: www.aaaai.org 2. Food Allergy Research and Education  (FARE): foodallergy.org 3. Mothers of Asthmatics: http://www.asthmacommunitynetwork.org 4. American College of Allergy, Asthma, and Immunology: www.acaai.org   COVID-19 Vaccine Information can be found at: [email or web address].[email or web address] For questions related to vaccine distribution or appointments, please email [email or web address] or call [phone].     "Like" [place] on Facebook and Instagram for our latest updates!       Make sure you are registered to vote! If you have moved or changed any of your contact information, you will need to get this updated before voting!  In some cases, you MAY be able to register to vote online: [email or web address]

## 2023-04-16 NOTE — Telephone Encounter (Signed)
Received a verbal from Dr. Dellis Anes that patient's D-Dimer result was within normal limits. Called patient and advised of lab result. Patient verbalized understanding.

## 2023-04-16 NOTE — Progress Notes (Signed)
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 chest CT next visit)    Never smoker   Recurrent admissions for breathing issues with hypercapnia and hypoxia- thought to be secondary to untreated obstructive sleep apnea (now on BiPAP with good results)   Normal chest CT (March 2023)    Perennial allergic rhinitis (dust mites) - on allergen immunotherapy   Chronic urticaria - controlled with antihistamines alone   Recurrent infections - with largely normal workup in 2020 and then lacluster Streptococcal titers in November 2023   Complicated past medical history, including disability as well as bipolar 1 depression and vitamin D deficiency  Plan/Recommendations:   1. Asthma-COPD overlap syndrome - Lung testing not done.  - We are going to get a D-dimer to see if you have a pulmonary embolism (we will cal lyou - Daily controller medication(s): Dupixent every two weeks and Breztri two puffs twice daily - Prior to physical activity: Ventolin (GRAY INHALER) 2 puffs 10-15 minutes before physical activity. - Rescue medications: Ventolin (GRAY INHALER) 2 puffs AND 2 puffs Atrovent (GREEN INHALER) every 4-6 hours as needed - Asthma control goals:  * Full participation in all desired activities (may need albuterol before activity) * Albuterol use two time or less a week on average (not counting use with activity) * Cough interfering with sleep two time or less a month * Oral steroids no more than once a year * No hospitalizations   2. Chronic urticaria - under good control - Continue with levocetirizine twice daily EVERY DAY FOR NOW.  - Continue with famotidine twice daily AS NEEDED.  - Continue with Doxepin 25mg  at night to help with itching AS NEEDED.   3. Itching - Start hydroxyzine 50mg  every night to see if this can help with itching. - INCREASE Xyzal to one tablet twice daily.    4. 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 - Continue with allergy shots at the same schedule (come back next week so you start your allergy shots again).   5. Follow up in 1-2 months or earlier if needed.    Subjective:   [name] is a 62 y.o. female presenting today for follow up of  Chief Complaint  Patient presents with  . Pruritus    Symptoms since Friday     [facility] [place] has a history of the following: Patient Active Problem List   Diagnosis Date Noted  . Cirrhosis (HCC) 03/07/2023  . Vocal cord dysfunction 03/05/2023  . Urinary incontinence 01/25/2023  . Influenza A 01/11/2023  . Bilateral impacted cerumen 12/04/2022  . Not well controlled severe persistent asthma 10/25/2022  . Acute on chronic respiratory failure with hypoxemia (HCC) 10/07/2022  . Abnormal TSH 09/13/2022  . Chronic sinusitis 08/14/2022  . PND (paroxysmal nocturnal dyspnea) 06/07/2022  . Vitamin D deficiency 05/20/2022  . Eczema 05/18/2022  . Insomnia 05/18/2022  . Allergic conjunctivitis 05/18/2022  . Chronic respiratory failure with hypoxia and hypercapnia (HCC) 04/30/2022  . Bilateral hearing loss 04/14/2022  . Moderate persistent asthma without complication 02/02/2022  . Low back pain 10/11/2021  . Abnormal CXR 07/17/2021  . Aortic atherosclerosis (HCC) 07/05/2021  . URI (upper respiratory infection) 07/05/2021  . Arthrodesis status 06/20/2021  . Drug allergy 05/14/2021  . Acute cough 05/14/2021  . Bilateral leg weakness 04/17/2021  . Status post cervical  spinal fusion 12/27/2020  . History of stroke 12/11/2020  . Medication management 10/21/2020  . Body mass index (BMI) 26.0-26.9, adult 08/23/2020  . Healthcare maintenance 08/11/2020  . Cervical spondylosis with myelopathy and radiculopathy 07/28/2020  . Preop exam for internal medicine 06/19/2020  . Hypotension due to  medication 06/11/2020  . Essential (primary) hypertension 05/17/2020  . Other spondylosis with myelopathy, cervical region 02/16/2020  . Cervical spondylosis 02/02/2020  . Radiculopathy, cervical region 02/02/2020  . Spondylolysis, cervical region 02/02/2020  . Neck pain 02/02/2020  . Leg cramping 08/12/2019  . Laryngopharyngeal reflux (LPR) 05/26/2019  . Sinus pressure 05/26/2019  . Hypophosphatemia 01/07/2019  . Vertigo 09/23/2018  . Dysfunction of left eustachian tube 09/23/2018  . Gait disorder 04/21/2018  . Leukocytosis 04/15/2018  . Nausea & vomiting 04/15/2018  . Seizure disorder (HCC)   . Prolapsed cervical intervertebral disc 03/11/2018  . Cervical radiculopathy 02/18/2018  . 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 Syncope 09/04/2017  . Constipation 09/04/2017  . Nonintractable headache 09/04/2017  . Leg swelling 07/26/2017  . 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 Hyperlipidemia 04/20/2017  . Pedal edema 04/19/2017  . Bipolar I disorder (HCC) 09/19/2016  . Facial pain 07/12/2016  . Rash 06/14/2016  . Migraine 01/25/2016  . Idiopathic urticaria/pruritus 01/23/2016  . Allergic rhinitis 01/23/2016  . Oral candidiasis 01/23/2016  . Greater trochanteric bursitis of both hips 09/08/2015  . Chest pain 06/22/2015  . Itching 06/22/2015  . Bilateral knee pain 06/22/2015  . Bilateral hip pain 06/22/2015  . Anemia, iron deficiency 12/22/2014  . Fatigue 12/21/2014  . Rash and nonspecific skin eruption 08/24/2014  . Intertrigo 08/07/2014  . Dyspareunia 07/12/2014  . Menopausal syndrome (hot flushes) 07/12/2014  . History of tobacco abuse 07/12/2014  . Dizziness 06/22/2014  . Weakness 06/22/2014  . Fibromyalgia 05/14/2014  . Encounter for well adult exam with abnormal findings 04/22/2014  . Recurrent boils 04/22/2014  . Recurrent falls 04/22/2014  . Peripheral vascular disease (HCC)   . Depression   . Anxiety   . Gastroesophageal reflux disease   . Prediabetes  02/23/2014  . COPD exacerbation (HCC) 02/23/2014  . Screening mammogram for high-risk patient 02/23/2014  . Back pain 07/22/2013  . COPD/asthma 08/24/2009  . Headache(784.0) 08/24/2009  . 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    History obtained from: chart review and patient.  [name] is a 62 y.o. female presenting for a sick visit. She was last seen in March 2024.  At that time, her lung testing was not done.  We recommended continuing with Dupixent and waiting to see what Dr. Delton Coombes recommends regarding her inhalers and nebulizers.  Will continue with Ventolin 2 puffs 10 to 15 minutes before physical activity.  We also continue with levocetirizine twice daily as needed as well as famotidine twice daily as needed.  For her rhinitis, we continue with Flonase as well as allergy shots.  Since last visit, she has mostly done well. But on Thursday she was at [facility]. She was trying on clothes and started itching when she got up the next morning. She normally itches when she is in the store anyway. She does report that she did keep her undergarments on during the trying on process. She has been having trouble sleeping and she has been drinking vinegar and olive oil to calm it down. She did use some baseline. This was only the itching - no hives at all. Dupixent is up to date. She has been doing well with this.   During  this episode of itchin, she started having a yeast infection which was treated with Tinidazole.   Then she started having some pain with inspiration. She repots that she has pain with inspiration. She is reporting some pain under her left breast. She has had this but it comes and goes. This current episode has been going on since Thursday.   Asthma/Respiratory Symptom History: Asthma is the same. She did see Dr. Delton Coombes who recommended  seeing Speech Therapy and continuing with [facility]. She did go to see Dr. Pollyann Kennedy on May 1st. She saw Speech Therapy on April 16th. This was a one time  visit until she goes to ENT. She did some voice exercises. She does not think that they helped much at all.   IMPRESSION: 1. No acute findings in the thorax. 2. Aortic atherosclerosis, in addition to right coronary artery disease. Please note that although the presence of coronary artery calcium documents the presence of coronary artery disease, the severity of this disease and any potential stenosis cannot be assessed on this non-gated CT examination. Assessment for potential risk factor modification, dietary therapy or pharmacologic therapy may be warranted, if clinically indicated. 3. Diffuse bronchial wall thickening with mild to moderate centrilobular and paraseptal emphysema; imaging findings suggestive of underlying COPD. 4. Morphologic changes in the liver suggesting underlying cirrhosis.  {Blank single:19197::"Allergic Rhinitis Symptom History: ***"," "}  {Blank single:19197::"Food Allergy Symptom History: ***"," "}  {Blank single:19197::"Skin Symptom History: ***"," "}  GERD Symptom History: She is going to see Dr. Myrtie Neither in June 2024.   Otherwise, there have been no changes to her past medical history, surgical history, family history, or social history.    ROS     Objective:   Blood pressure (!) 142/88, pulse 80, temperature 97.9 F (36.6 C), temperature source Temporal, resp. rate 18, SpO2 94 %. There is no height or weight on file to calculate BMI.    Physical Exam   Diagnostic studies: {Blank single:19197::"none","deferred due to recent antihistamine use","labs sent instead"," "}  Spirometry: {Blank single:19197::"results normal (FEV1: ***%, FVC: ***%, FEV1/FVC: ***%)","results abnormal (FEV1: ***%, FVC: ***%, FEV1/FVC: ***%)"}.    {Blank single:19197::"Spirometry consistent with mild obstructive disease","Spirometry consistent with moderate obstructive disease","Spirometry consistent with severe obstructive disease","Spirometry consistent with possible  restrictive disease","Spirometry consistent with mixed obstructive and restrictive disease","Spirometry uninterpretable due to technique","Spirometry consistent with normal pattern"}. {Blank single:19197::"Albuterol/Atrovent nebulizer","Xopenex/Atrovent nebulizer","Albuterol nebulizer","Albuterol four puffs via MDI","Xopenex four puffs via MDI"} treatment given in clinic with {Blank single:19197::"significant improvement in FEV1 per ATS criteria","significant improvement in FVC per ATS criteria","significant improvement in FEV1 and FVC per ATS criteria","improvement in FEV1, but not significant per ATS criteria","improvement in FVC, but not significant per ATS criteria","improvement in FEV1 and FVC, but not significant per ATS criteria","no improvement"}.  Allergy Studies: {Blank single:19197::"none","labs sent instead"," "}    {Blank single:19197::"Allergy testing results were read and interpreted by myself, documented by clinical staff."," "}      Malachi Bonds, MD  [facility]

## 2023-04-17 DIAGNOSIS — K219 Gastro-esophageal reflux disease without esophagitis: Secondary | ICD-10-CM | POA: Diagnosis not present

## 2023-04-17 DIAGNOSIS — R490 Dysphonia: Secondary | ICD-10-CM | POA: Diagnosis not present

## 2023-04-18 ENCOUNTER — Ambulatory Visit: Payer: Medicare HMO | Attending: Acute Care

## 2023-04-18 ENCOUNTER — Telehealth: Payer: Self-pay | Admitting: *Deleted

## 2023-04-18 ENCOUNTER — Encounter: Payer: Self-pay | Admitting: Allergy & Immunology

## 2023-04-18 DIAGNOSIS — M6281 Muscle weakness (generalized): Secondary | ICD-10-CM | POA: Diagnosis not present

## 2023-04-18 DIAGNOSIS — R296 Repeated falls: Secondary | ICD-10-CM | POA: Diagnosis not present

## 2023-04-18 DIAGNOSIS — M25552 Pain in left hip: Secondary | ICD-10-CM | POA: Insufficient documentation

## 2023-04-18 DIAGNOSIS — M25561 Pain in right knee: Secondary | ICD-10-CM | POA: Insufficient documentation

## 2023-04-18 DIAGNOSIS — G8929 Other chronic pain: Secondary | ICD-10-CM | POA: Insufficient documentation

## 2023-04-18 DIAGNOSIS — M25562 Pain in left knee: Secondary | ICD-10-CM | POA: Diagnosis not present

## 2023-04-18 DIAGNOSIS — M25551 Pain in right hip: Secondary | ICD-10-CM

## 2023-04-18 NOTE — Telephone Encounter (Signed)
-----   Message from Alfonse Spruce, MD sent at 04/18/2023  7:55 AM EDT ----- Can someone call and check on her today?

## 2023-04-18 NOTE — Telephone Encounter (Signed)
Called and spoke with the patient, she stated that she is feeling a whole lot better. She stated that she is feeling better with her breathing and itching compared to Tuesday. She will call us back if she needs anything.

## 2023-04-18 NOTE — Therapy (Signed)
OUTPATIENT PHYSICAL THERAPY TREATMENT NOTE   Patient Name: [name] MRN: [id number] DOB:[birth year], 62 y.o., female Today's Date: 04/18/2023  PCP: Corwin Levins, MD REFERRING PROVIDER:  Corwin Levins, MD (fall,gait) Kathryne Hitch, MD (hip, knee)   END OF SESSION:   PT End of Session - 04/18/23 1530     Visit Number 2    Number of Visits 13    Date for PT Re-Evaluation 05/18/23    Authorization Type humana MCR; MCD    Authorization Time Period 4/29-6/15/24    Authorization - Visit Number 1    Authorization - Number of Visits 12    PT Start Time 1530    PT Stop Time 1610    PT Time Calculation (min) 40 min    Activity Tolerance Patient tolerated treatment well    Behavior During Therapy [facility] for tasks assessed/performed             Past Medical History:  Diagnosis Date   Anemia, iron deficiency 12/22/2014   pt. 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      Bipolar disorder (HCC)    with anxiety, depression   COPD (chronic obstructive pulmonary disease) (HCC)    Eczema 05/18/2022   Fibromyalgia 05/14/2014   GERD (gastroesophageal reflux disease)    History of kidney stones    Hyperlipidemia 04/20/2017   HYPERTENSION 05/12/2009   Qualifier: Diagnosis of  By: Truman Hayward Duncan Dull), [name]     Peripheral vascular disease (HCC)    Pneumonia    Prediabetes 02/23/2014   pt. denies   Seizure (HCC)    Stroke (HCC) 11/2020   Urticaria    Past Surgical History:  Procedure Laterality Date   ABDOMINAL HYSTERECTOMY     ANTERIOR CERVICAL DECOMP/DISCECTOMY FUSION N/A 07/28/2020   Procedure: ANTERIOR CERVICAL DECOMPRESSION/DISCECTOMY FUSION. INTERBODY PROTHESIS, PLATE/SCREWS CERVICAL THREE- CERVICAL FOUR, CERVICAL FOUR- CERVICAL FIVE;  Surgeon: Tressie Stalker, MD;   Location: [facility] OR;  Service: Neurosurgery;  Laterality: N/A;   BACK SURGERY     COLONOSCOPY  12/20/2011   Procedure: COLONOSCOPY;  Surgeon: Freddy Jaksch, MD;  Location: [facility] ENDOSCOPY;  Service: Endoscopy;  Laterality: N/A;   COLONOSCOPY  03/05/2012   Procedure: COLONOSCOPY;  Surgeon: Freddy Jaksch, MD;  Location: [facility] ENDOSCOPY;  Service: Endoscopy;  Laterality: N/A;   DIAGNOSTIC LAPAROSCOPY     HEMORRHOID SURGERY     INCISE AND DRAIN ABCESS     KIDNEY STONE SURGERY     NECK SURGERY     x 2 Dr Terrilee Files   SPINE SURGERY  2019   TOE SURGERY     TUBAL LIGATION     Patient Active Problem List   Diagnosis Date Noted   Cirrhosis (HCC) 03/07/2023   Vocal cord dysfunction 03/05/2023   Urinary incontinence 01/25/2023   Influenza A 01/11/2023   Bilateral impacted cerumen 12/04/2022   Not well controlled severe persistent asthma 10/25/2022   Acute on chronic respiratory failure with hypoxemia (HCC) 10/07/2022   Abnormal TSH 09/13/2022   Chronic sinusitis 08/14/2022   PND (paroxysmal nocturnal dyspnea) 06/07/2022   Vitamin D deficiency 05/20/2022   Eczema 05/18/2022   Insomnia 05/18/2022   Allergic conjunctivitis 05/18/2022   Chronic respiratory failure with hypoxia and hypercapnia (HCC) 04/30/2022   Bilateral hearing loss 04/14/2022  Moderate persistent asthma without complication 02/02/2022   Low back pain 10/11/2021   Abnormal CXR 07/17/2021   Aortic atherosclerosis (HCC) 07/05/2021   URI (upper respiratory infection) 07/05/2021   Arthrodesis status 06/20/2021   Drug allergy 05/14/2021   Acute cough 05/14/2021   Bilateral leg weakness 04/17/2021   Status post cervical spinal fusion 12/27/2020   History of stroke 12/11/2020   Medication management 10/21/2020   Body mass index (BMI) 26.0-26.9, adult 08/23/2020   Healthcare maintenance 08/11/2020   Cervical spondylosis with myelopathy and radiculopathy 07/28/2020   Preop exam for internal medicine 06/19/2020   Hypotension due  to medication 06/11/2020   Essential (primary) hypertension 05/17/2020   Other spondylosis with myelopathy, cervical region 02/16/2020   Cervical spondylosis 02/02/2020   Radiculopathy, cervical region 02/02/2020   Spondylolysis, cervical region 02/02/2020   Neck pain 02/02/2020   Leg cramping 08/12/2019   Laryngopharyngeal reflux (LPR) 05/26/2019   Sinus pressure 05/26/2019   Hypophosphatemia 01/07/2019   Vertigo 09/23/2018   Dysfunction of left eustachian tube 09/23/2018   Gait disorder 04/21/2018   Leukocytosis 04/15/2018   Nausea & vomiting 04/15/2018   Seizure disorder (HCC)    Prolapsed cervical intervertebral disc 03/11/2018   Cervical radiculopathy 02/18/2018   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   Syncope 09/04/2017   Constipation 09/04/2017   Nonintractable headache 09/04/2017   Leg swelling 07/26/2017   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   Hyperlipidemia 04/20/2017   Pedal edema 04/19/2017   Bipolar I disorder (HCC) 09/19/2016   Facial pain 07/12/2016   Rash 06/14/2016   Migraine 01/25/2016   Idiopathic urticaria/pruritus 01/23/2016   Allergic rhinitis 01/23/2016   Oral candidiasis 01/23/2016   Greater trochanteric bursitis of both hips 09/08/2015   Chest pain 06/22/2015   Itching 06/22/2015   Bilateral knee pain 06/22/2015   Bilateral hip pain 06/22/2015   Anemia, iron deficiency 12/22/2014   Fatigue 12/21/2014   Rash and nonspecific skin eruption 08/24/2014   Intertrigo 08/07/2014   Dyspareunia 07/12/2014   Menopausal syndrome (hot flushes) 07/12/2014   History of tobacco abuse 07/12/2014   Dizziness 06/22/2014   Weakness 06/22/2014   Fibromyalgia 05/14/2014   Encounter for well adult exam with abnormal findings 04/22/2014   Recurrent boils 04/22/2014   Recurrent falls 04/22/2014   Peripheral vascular disease (HCC)    Depression    Anxiety    Gastroesophageal reflux disease    Prediabetes 02/23/2014   COPD exacerbation (HCC) 02/23/2014    Screening mammogram for high-risk patient 02/23/2014   Back pain 07/22/2013   COPD/asthma 08/24/2009   Headache(784.0) 08/24/2009   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    REFERRING DIAG:  R29.6 (ICD-10-CM) - Recurrent falls  R26.9 (ICD-10-CM) - Gait disorder    Diagnosis  M25.562,G89.29 (ICD-10-CM) - Chronic pain of left knee  M25.561,G89.29 (ICD-10-CM) - Chronic pain of right knee  M25.551,M25.552 (ICD-10-CM) - Bilateral hip pain    THERAPY DIAG:  Repeated falls  Muscle weakness (generalized)  Chronic pain of both knees  Pain of both hip joints  Rationale for Evaluation and Treatment Rehabilitation  PERTINENT HISTORY:  Severe asthma  COPD Bipolar Fibromyalgia History of stroke History of seizure, reports she has not experienced a seizure in over 2 years   PRECAUTIONS: fall   SUBJECTIVE:  SUBJECTIVE STATEMENT:  "I have been doing my exercises. No falls. Knees and hips feel pretty good."   PAIN:  Are you having pain? None currentlyYes: NPRS scale: 10 at worst/10 Pain location: bilateral knees and hips Pain description: sharp Aggravating factors: worse in the AM Relieving factors: medication     OBJECTIVE: (objective measures completed at initial evaluation unless otherwise dated)  DIAGNOSTIC FINDINGS:  Lt knee X-ray: 2 views of the left knee show normal alignment no acute findings.  There  is no effusion.    Rt knee X-ray: 2 views of the right knee are normal.  The alignment is normal and the  joint spaces well-maintained.    PATIENT SURVEYS:  FOTO 27% function to 52% predicted    COGNITION: Overall cognitive status: Within functional limits for tasks assessed                         SENSATION: Not tested   EDEMA:  No obvious swelling about bilateral knees     MUSCLE LENGTH: Hamstrings: mild tightness bilaterally      POSTURE: not formally assessed    PALPATION: Diffuse tenderness about anterior knees    LOWER EXTREMITY ROM:   Active ROM Right eval Left eval  Hip flexion WNL WNL  Hip extension      Hip abduction      Hip adduction      Hip internal rotation      Hip external rotation      Knee flexion Full Full   Knee extension Full Full  Ankle dorsiflexion      Ankle plantarflexion      Ankle inversion      Ankle eversion       (Blank rows = not tested)   LOWER EXTREMITY MMT:   MMT Right eval Left eval 04/18/23  Hip flexion 4 4 4  bilateral   Hip extension Unable to lift leg in prone (activation noted) Unable to lift leg in prone (activation noted)    Hip abduction 4- 4-   Hip adduction       Hip internal rotation       Hip external rotation       Knee flexion 5 4+   Knee extension 5 4+   Ankle dorsiflexion       Ankle plantarflexion       Ankle inversion       Ankle eversion        (Blank rows = not tested)   LOWER EXTREMITY SPECIAL TESTS:  (+) McMurrays and Thessaly's RLE  (-) Anterior Drawer, Posterior Drawer bilaterally  (+) [place] bilaterally    FUNCTIONAL TESTS:  5 x STS: 21 seconds  SLS: RLE 11 seconds, LLE 30 seconds  DGI: 20/24   GAIT: Distance walked: 10 ft Assistive device utilized: None Level of assistance: Complete Independence Comments: WNL   [facility] Adult PT Treatment:                                                DATE: 04/18/23 Therapeutic Exercise: Hip bridge 2 x 10  Sidelying hip abduction 2 x 10  SLR 2 x 10  LAQ 2 x 10 @ 2 lbs  Seated march 2 x 10 @ 2 lbs  HS curl green band 2 x 10  Sit to stand 1 x5; 1  x 10  Standing calf raises 2 x 10  Updated HEP     [facility] Adult PT Treatment:                                                DATE: 04/02/23 Therapeutic Exercise: Demonstrated and issue initial HEP.      Therapeutic Activity: Education on assessment findings that will be addressed  throughout duration of POC.            PATIENT EDUCATION:  Education details: see treatment  Person educated: Patient Education method: Explanation, Demonstration, Tactile cues, Verbal cues, and Handouts Education comprehension: verbalized understanding, returned demonstration, verbal cues required, tactile cues required, and needs further education   HOME EXERCISE PROGRAM: Access Code: [email or web address] URL: [email or web address] Date: 04/02/2023 Prepared by: Letitia Libra   Exercises - Supine Bridge  - 1 x daily - 7 x weekly - 2 sets - 10 reps - Supine Active Straight Leg Raise  - 1 x daily - 7 x weekly - 2 sets - 10 reps - Hooklying Clamshell with Resistance  - 1 x daily - 7 x weekly - 2 sets - 10 reps   ASSESSMENT:   CLINICAL IMPRESSION: Patient arrives for first PT treatment session without reports of pain and no recent falls. Focused on hip and knee strengthening without onset of pain. She quickly fatigues with majority of strengthening exercises. Frequent rest breaks required to allow for recovery due to her history of severe asthma. With sit to stand she initially demonstrates knee hyperextension, but is able to correct once cued.    OBJECTIVE IMPAIRMENTS: decreased activity tolerance, decreased balance, decreased endurance, decreased knowledge of condition, difficulty walking, decreased strength, impaired flexibility, improper body mechanics, and pain.    ACTIVITY LIMITATIONS: carrying, lifting, bending, standing, squatting, transfers, and locomotion level   PARTICIPATION LIMITATIONS: meal prep, cleaning, laundry, shopping, community activity, and yard work   PERSONAL FACTORS: Age, Fitness, Time since onset of injury/illness/exacerbation, and 3+ comorbidities: see PMH above  are also affecting patient's functional outcome.    REHAB POTENTIAL: Good   CLINICAL DECISION MAKING: Evolving/moderate complexity   EVALUATION COMPLEXITY: Moderate     GOALS: Goals  reviewed with patient? Yes   SHORT TERM GOALS: Target date: 04/23/2023     Patient will be independent and compliant with initial HEP.    Baseline: issued at eval Goal status: INITIAL   2.  Patient will complete 5 x STS in </ 15 seconds to improve her functional strength.  Baseline: see above  Goal status: INITIAL   3.  Patient will maintain Rt SLS for at least 20 seconds to improve stability when navigating on uneven terrain.  Baseline: see above  Goal status: INITIAL   LONG TERM GOALS: Target date: 05/18/23       Patient will report no instances of knee giving out when walking for at least 4 weeks, indicative of improvement in her current condition.  Baseline: random occurrences.  Goal status: INITIAL   2.  Patient will demonstrate at least 3+/5 bilateral hip extensor strength to improve stability about the chain with walking.  Baseline: see above  Goal status: INITIAL   3.  Patient will score at least 52% on FOTO to signify clinically meaningful improvement in functional abilities.    Baseline: see above  Goal status: INITIAL   4.  Patient will  be able to complete step up from 4 inch step independently to improve safety with navigating curbs in the community.  Baseline: requires assistance Goal status: INITIAL   5.  Patient will be independent with advanced home program to progress/maintain current level of function.  Baseline: initial HEP issued  Goal status: INITIAL       PLAN:   PT FREQUENCY: 1-2x/week   PT DURATION: 6 weeks   PLANNED INTERVENTIONS: Therapeutic exercises, Therapeutic activity, Neuromuscular re-education, Balance training, Gait training, Patient/Family education, Self Care, Joint mobilization, Dry Needling, Cryotherapy, Moist heat, Manual therapy, and Re-evaluation   PLAN FOR NEXT SESSION: review and progress HEP prn; hip/knee strengthening, balance. Be mindful of patient's severe asthma   Letitia Libra, PT, DPT, ATC 04/18/23 4:12 PM

## 2023-04-19 DIAGNOSIS — J9610 Chronic respiratory failure, unspecified whether with hypoxia or hypercapnia: Secondary | ICD-10-CM | POA: Diagnosis not present

## 2023-04-19 DIAGNOSIS — J449 Chronic obstructive pulmonary disease, unspecified: Secondary | ICD-10-CM | POA: Diagnosis not present

## 2023-04-19 NOTE — Telephone Encounter (Signed)
Thanks for checking on her!  ? ?Taren Dymek, MD ?[facility] ? ?

## 2023-04-24 ENCOUNTER — Ambulatory Visit: Payer: Medicare HMO

## 2023-04-24 DIAGNOSIS — M25561 Pain in right knee: Secondary | ICD-10-CM | POA: Diagnosis not present

## 2023-04-24 DIAGNOSIS — M25562 Pain in left knee: Secondary | ICD-10-CM | POA: Diagnosis not present

## 2023-04-24 DIAGNOSIS — M6281 Muscle weakness (generalized): Secondary | ICD-10-CM

## 2023-04-24 DIAGNOSIS — R296 Repeated falls: Secondary | ICD-10-CM

## 2023-04-24 DIAGNOSIS — M25551 Pain in right hip: Secondary | ICD-10-CM | POA: Diagnosis not present

## 2023-04-24 DIAGNOSIS — J449 Chronic obstructive pulmonary disease, unspecified: Secondary | ICD-10-CM | POA: Diagnosis not present

## 2023-04-24 DIAGNOSIS — G8929 Other chronic pain: Secondary | ICD-10-CM | POA: Diagnosis not present

## 2023-04-24 DIAGNOSIS — R32 Unspecified urinary incontinence: Secondary | ICD-10-CM | POA: Diagnosis not present

## 2023-04-24 DIAGNOSIS — M25552 Pain in left hip: Secondary | ICD-10-CM | POA: Diagnosis not present

## 2023-04-24 NOTE — Therapy (Signed)
OUTPATIENT PHYSICAL THERAPY TREATMENT NOTE   Patient Name: [name] MRN: [id number] DOB:1961-04-30, 62 y.o., female Today's Date: 04/24/2023  PCP: Corwin Levins, MD REFERRING PROVIDER:  Corwin Levins, MD (fall,gait) Kathryne Hitch, MD (hip, knee)   END OF SESSION:   PT End of Session - 04/24/23 1144     Visit Number 3    Number of Visits 13    Date for PT Re-Evaluation 05/18/23    Authorization Type humana MCR; MCD    Authorization Time Period 4/29-6/15/24    Authorization - Visit Number 2    Authorization - Number of Visits 12    PT Start Time 1144    PT Stop Time 1227    PT Time Calculation (min) 43 min    Activity Tolerance Patient tolerated treatment well    Behavior During Therapy [facility] for tasks assessed/performed              Past Medical History:  Diagnosis Date   Anemia, iron deficiency 12/22/2014   pt. 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      Bipolar disorder (HCC)    with anxiety, depression   COPD (chronic obstructive pulmonary disease) (HCC)    Eczema 05/18/2022   Fibromyalgia 05/14/2014   GERD (gastroesophageal reflux disease)    History of kidney stones    Hyperlipidemia 04/20/2017   HYPERTENSION 05/12/2009   Qualifier: Diagnosis of  By: Truman Hayward Duncan Dull), [name]     Peripheral vascular disease (HCC)    Pneumonia    Prediabetes 02/23/2014   pt. denies   Seizure (HCC)    Stroke (HCC) 11/2020   Urticaria    Past Surgical History:  Procedure Laterality Date   ABDOMINAL HYSTERECTOMY     ANTERIOR CERVICAL DECOMP/DISCECTOMY FUSION N/A 07/28/2020   Procedure: ANTERIOR CERVICAL DECOMPRESSION/DISCECTOMY FUSION. INTERBODY PROTHESIS, PLATE/SCREWS CERVICAL THREE- CERVICAL FOUR, CERVICAL FOUR- CERVICAL FIVE;  Surgeon: Tressie Stalker,  MD;  Location: [facility] OR;  Service: Neurosurgery;  Laterality: N/A;   BACK SURGERY     COLONOSCOPY  12/20/2011   Procedure: COLONOSCOPY;  Surgeon: Freddy Jaksch, MD;  Location: [facility] ENDOSCOPY;  Service: Endoscopy;  Laterality: N/A;   COLONOSCOPY  03/05/2012   Procedure: COLONOSCOPY;  Surgeon: Freddy Jaksch, MD;  Location: [facility] ENDOSCOPY;  Service: Endoscopy;  Laterality: N/A;   DIAGNOSTIC LAPAROSCOPY     HEMORRHOID SURGERY     INCISE AND DRAIN ABCESS     KIDNEY STONE SURGERY     NECK SURGERY     x 2 Dr Terrilee Files   SPINE SURGERY  2019   TOE SURGERY     TUBAL LIGATION     Patient Active Problem List   Diagnosis Date Noted   Cirrhosis (HCC) 03/07/2023   Vocal cord dysfunction 03/05/2023   Urinary incontinence 01/25/2023   Influenza A 01/11/2023   Bilateral impacted cerumen 12/04/2022   Not well controlled severe persistent asthma 10/25/2022   Acute on chronic respiratory failure with hypoxemia (HCC) 10/07/2022   Abnormal TSH 09/13/2022   Chronic sinusitis 08/14/2022   PND (paroxysmal nocturnal dyspnea) 06/07/2022   Vitamin D deficiency 05/20/2022   Eczema 05/18/2022   Insomnia 05/18/2022   Allergic conjunctivitis 05/18/2022   Chronic respiratory failure with hypoxia and hypercapnia (HCC) 04/30/2022   Bilateral hearing loss 04/14/2022  Moderate persistent asthma without complication 02/02/2022   Low back pain 10/11/2021   Abnormal CXR 07/17/2021   Aortic atherosclerosis (HCC) 07/05/2021   URI (upper respiratory infection) 07/05/2021   Arthrodesis status 06/20/2021   Drug allergy 05/14/2021   Acute cough 05/14/2021   Bilateral leg weakness 04/17/2021   Status post cervical spinal fusion 12/27/2020   History of stroke 12/11/2020   Medication management 10/21/2020   Body mass index (BMI) 26.0-26.9, adult 08/23/2020   Healthcare maintenance 08/11/2020   Cervical spondylosis with myelopathy and radiculopathy 07/28/2020   Preop exam for internal medicine 06/19/2020   Hypotension  due to medication 06/11/2020   Essential (primary) hypertension 05/17/2020   Other spondylosis with myelopathy, cervical region 02/16/2020   Cervical spondylosis 02/02/2020   Radiculopathy, cervical region 02/02/2020   Spondylolysis, cervical region 02/02/2020   Neck pain 02/02/2020   Leg cramping 08/12/2019   Laryngopharyngeal reflux (LPR) 05/26/2019   Sinus pressure 05/26/2019   Hypophosphatemia 01/07/2019   Vertigo 09/23/2018   Dysfunction of left eustachian tube 09/23/2018   Gait disorder 04/21/2018   Leukocytosis 04/15/2018   Nausea & vomiting 04/15/2018   Seizure disorder (HCC)    Prolapsed cervical intervertebral disc 03/11/2018   Cervical radiculopathy 02/18/2018   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   Syncope 09/04/2017   Constipation 09/04/2017   Nonintractable headache 09/04/2017   Leg swelling 07/26/2017   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   Hyperlipidemia 04/20/2017   Pedal edema 04/19/2017   Bipolar I disorder (HCC) 09/19/2016   Facial pain 07/12/2016   Rash 06/14/2016   Migraine 01/25/2016   Idiopathic urticaria/pruritus 01/23/2016   Allergic rhinitis 01/23/2016   Oral candidiasis 01/23/2016   Greater trochanteric bursitis of both hips 09/08/2015   Chest pain 06/22/2015   Itching 06/22/2015   Bilateral knee pain 06/22/2015   Bilateral hip pain 06/22/2015   Anemia, iron deficiency 12/22/2014   Fatigue 12/21/2014   Rash and nonspecific skin eruption 08/24/2014   Intertrigo 08/07/2014   Dyspareunia 07/12/2014   Menopausal syndrome (hot flushes) 07/12/2014   History of tobacco abuse 07/12/2014   Dizziness 06/22/2014   Weakness 06/22/2014   Fibromyalgia 05/14/2014   Encounter for well adult exam with abnormal findings 04/22/2014   Recurrent boils 04/22/2014   Recurrent falls 04/22/2014   Peripheral vascular disease (HCC)    Depression    Anxiety    Gastroesophageal reflux disease    Prediabetes 02/23/2014   COPD exacerbation (HCC) 02/23/2014    Screening mammogram for high-risk patient 02/23/2014   Back pain 07/22/2013   COPD/asthma 08/24/2009   Headache(784.0) 08/24/2009   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    REFERRING DIAG:  R29.6 (ICD-10-CM) - Recurrent falls  R26.9 (ICD-10-CM) - Gait disorder    Diagnosis  M25.562,G89.29 (ICD-10-CM) - Chronic pain of left knee  M25.561,G89.29 (ICD-10-CM) - Chronic pain of right knee  M25.551,M25.552 (ICD-10-CM) - Bilateral hip pain    THERAPY DIAG:  Repeated falls  Muscle weakness (generalized)  Chronic pain of both knees  Pain of both hip joints  Rationale for Evaluation and Treatment Rehabilitation  PERTINENT HISTORY:  Severe asthma  COPD Bipolar Fibromyalgia History of stroke History of seizure, reports she has not experienced a seizure in over 2 years   PRECAUTIONS: fall   SUBJECTIVE:  SUBJECTIVE STATEMENT:  "No falls. My knees and hips were hurting yesterday from a walk, but they are pretty good now."    PAIN:  Are you having pain? None currentlyYes: NPRS scale: 7 at worst/10 Pain location: bilateral knees and hips Pain description: sharp Aggravating factors: worse in the AM Relieving factors: medication     OBJECTIVE: (objective measures completed at initial evaluation unless otherwise dated)  DIAGNOSTIC FINDINGS:  Lt knee X-ray: 2 views of the left knee show normal alignment no acute findings.  There  is no effusion.    Rt knee X-ray: 2 views of the right knee are normal.  The alignment is normal and the  joint spaces well-maintained.    PATIENT SURVEYS:  FOTO 27% function to 52% predicted    COGNITION: Overall cognitive status: Within functional limits for tasks assessed                         SENSATION: Not tested   EDEMA:  No obvious swelling about  bilateral knees    MUSCLE LENGTH: Hamstrings: mild tightness bilaterally      POSTURE: not formally assessed    PALPATION: Diffuse tenderness about anterior knees    LOWER EXTREMITY ROM:   Active ROM Right eval Left eval  Hip flexion WNL WNL  Hip extension      Hip abduction      Hip adduction      Hip internal rotation      Hip external rotation      Knee flexion Full Full   Knee extension Full Full  Ankle dorsiflexion      Ankle plantarflexion      Ankle inversion      Ankle eversion       (Blank rows = not tested)   LOWER EXTREMITY MMT:   MMT Right eval Left eval 04/18/23  Hip flexion 4 4 4  bilateral   Hip extension Unable to lift leg in prone (activation noted) Unable to lift leg in prone (activation noted)    Hip abduction 4- 4-   Hip adduction       Hip internal rotation       Hip external rotation       Knee flexion 5 4+   Knee extension 5 4+   Ankle dorsiflexion       Ankle plantarflexion       Ankle inversion       Ankle eversion        (Blank rows = not tested)   LOWER EXTREMITY SPECIAL TESTS:  (+) McMurrays and Thessaly's RLE  (-) Anterior Drawer, Posterior Drawer bilaterally  (+) [place] bilaterally    FUNCTIONAL TESTS:  5 x STS: 21 seconds  SLS: RLE 11 seconds, LLE 30 seconds  DGI: 20/24  04/24/23: 5 x STS: 17.3 seconds  RLE SLS: 30 seconds    GAIT: Distance walked: 10 ft Assistive device utilized: None Level of assistance: Complete Independence Comments: WNL [facility] Adult PT Treatment:                                                DATE: 04/24/23 Therapeutic Exercise: Hip bridge with abduction green band 2 x 10  Standing calf raise 2 x 15  Standing hip abduction 2 x 10  Standing hip extension 2 x 10  Standing march  2 x 10  Sit to stand 3 x 5  Prone quad stretch x 60 sec  LAQ 2 x 10; 2.5# Updated HEP   [facility] Adult PT Treatment:                                                DATE: 04/18/23 Therapeutic Exercise: Hip bridge 2 x 10  Sidelying  hip abduction 2 x 10  SLR 2 x 10  LAQ 2 x 10 @ 2 lbs  Seated march 2 x 10 @ 2 lbs  HS curl green band 2 x 10  Sit to stand 1 x5; 1 x 10  Standing calf raises 2 x 10  Updated HEP     [facility] Adult PT Treatment:                                                DATE: 04/02/23 Therapeutic Exercise: Demonstrated and issue initial HEP.      Therapeutic Activity: Education on assessment findings that will be addressed throughout duration of POC.            PATIENT EDUCATION:  Education details: see treatment  Person educated: Patient Education method: Explanation, Demonstration, Tactile cues, Verbal cues, and Handouts Education comprehension: verbalized understanding, returned demonstration, verbal cues required, tactile cues required, and needs further education   HOME EXERCISE PROGRAM: Access Code: [email or web address] URL: [email or web address] Date: 04/02/2023 Prepared by: Letitia Libra   Exercises - Supine Bridge  - 1 x daily - 7 x weekly - 2 sets - 10 reps - Supine Active Straight Leg Raise  - 1 x daily - 7 x weekly - 2 sets - 10 reps - Hooklying Clamshell with Resistance  - 1 x daily - 7 x weekly - 2 sets - 10 reps   ASSESSMENT:   CLINICAL IMPRESSION: Patient arrives without reports of hip or knee pain. Continued focus on hip and knee strengthening, introducing standing strengthening today with good tolerance. Her 5 x STS time has improved compared to baseline, nearing this STG. She demonstrates significant improvement in Rt SLS time having surpassed the STG. She reported fatigue in her knees with standing activity, but no onset of pain. She has tendency to maintain knee hyperextension with standing activity requiring cues to correct. HEP was updated to include further strengthening.    OBJECTIVE IMPAIRMENTS: decreased activity tolerance, decreased balance, decreased endurance, decreased knowledge of condition, difficulty walking, decreased strength, impaired flexibility,  improper body mechanics, and pain.    ACTIVITY LIMITATIONS: carrying, lifting, bending, standing, squatting, transfers, and locomotion level   PARTICIPATION LIMITATIONS: meal prep, cleaning, laundry, shopping, community activity, and yard work   PERSONAL FACTORS: Age, Fitness, Time since onset of injury/illness/exacerbation, and 3+ comorbidities: see PMH above  are also affecting patient's functional outcome.    REHAB POTENTIAL: Good   CLINICAL DECISION MAKING: Evolving/moderate complexity   EVALUATION COMPLEXITY: Moderate     GOALS: Goals reviewed with patient? Yes   SHORT TERM GOALS: Target date: 04/23/2023     Patient will be independent and compliant with initial HEP.    Baseline: issued at eval Goal status: met   2.  Patient will complete 5 x STS in </ 15 seconds to improve her functional  strength.  Baseline: see above  Goal status: progressing    3.  Patient will maintain Rt SLS for at least 20 seconds to improve stability when navigating on uneven terrain.  Baseline: see above  Goal status: met   LONG TERM GOALS: Target date: 05/18/23       Patient will report no instances of knee giving out when walking for at least 4 weeks, indicative of improvement in her current condition.  Baseline: random occurrences.  Goal status: INITIAL   2.  Patient will demonstrate at least 3+/5 bilateral hip extensor strength to improve stability about the chain with walking.  Baseline: see above  Goal status: INITIAL   3.  Patient will score at least 52% on FOTO to signify clinically meaningful improvement in functional abilities.    Baseline: see above  Goal status: INITIAL   4.  Patient will be able to complete step up from 4 inch step independently to improve safety with navigating curbs in the community.  Baseline: requires assistance Goal status: INITIAL   5.  Patient will be independent with advanced home program to progress/maintain current level of function.  Baseline:  initial HEP issued  Goal status: INITIAL       PLAN:   PT FREQUENCY: 1-2x/week   PT DURATION: 6 weeks   PLANNED INTERVENTIONS: Therapeutic exercises, Therapeutic activity, Neuromuscular re-education, Balance training, Gait training, Patient/Family education, Self Care, Joint mobilization, Dry Needling, Cryotherapy, Moist heat, Manual therapy, and Re-evaluation   PLAN FOR NEXT SESSION: review and progress HEP prn; hip/knee strengthening, balance. Be mindful of patient's severe asthma   Letitia Libra, PT, DPT, ATC 04/24/23 12:27 PM

## 2023-04-25 DIAGNOSIS — R32 Unspecified urinary incontinence: Secondary | ICD-10-CM | POA: Diagnosis not present

## 2023-04-25 DIAGNOSIS — F6381 Intermittent explosive disorder: Secondary | ICD-10-CM | POA: Diagnosis not present

## 2023-04-25 DIAGNOSIS — J449 Chronic obstructive pulmonary disease, unspecified: Secondary | ICD-10-CM | POA: Diagnosis not present

## 2023-04-26 ENCOUNTER — Other Ambulatory Visit: Payer: Self-pay | Admitting: Allergy & Immunology

## 2023-04-26 ENCOUNTER — Ambulatory Visit: Payer: Medicare HMO

## 2023-04-26 DIAGNOSIS — R296 Repeated falls: Secondary | ICD-10-CM | POA: Diagnosis not present

## 2023-04-26 DIAGNOSIS — M6281 Muscle weakness (generalized): Secondary | ICD-10-CM

## 2023-04-26 DIAGNOSIS — M25561 Pain in right knee: Secondary | ICD-10-CM | POA: Diagnosis not present

## 2023-04-26 DIAGNOSIS — M25551 Pain in right hip: Secondary | ICD-10-CM

## 2023-04-26 DIAGNOSIS — G8929 Other chronic pain: Secondary | ICD-10-CM | POA: Diagnosis not present

## 2023-04-26 DIAGNOSIS — M25562 Pain in left knee: Secondary | ICD-10-CM | POA: Diagnosis not present

## 2023-04-26 DIAGNOSIS — M25552 Pain in left hip: Secondary | ICD-10-CM | POA: Diagnosis not present

## 2023-04-26 NOTE — Therapy (Signed)
OUTPATIENT PHYSICAL THERAPY TREATMENT NOTE   Patient Name: [name] MRN: [id number] DOB:1961-11-15, 62 y.o., female Today's Date: 04/26/2023  PCP: Corwin Levins, MD REFERRING PROVIDER:  Corwin Levins, MD (fall,gait) Kathryne Hitch, MD (hip, knee)   END OF SESSION:   PT End of Session - 04/26/23 1059     Visit Number 4    Number of Visits 13    Date for PT Re-Evaluation 05/18/23    Authorization Type humana MCR; MCD    Authorization Time Period 4/29-6/15/24    Authorization - Visit Number 3    Authorization - Number of Visits 12    PT Start Time 1059    PT Stop Time 1140    PT Time Calculation (min) 41 min    Activity Tolerance --    Behavior During Therapy WFL for tasks assessed/performed               Past Medical History:  Diagnosis Date   Anemia, iron deficiency 12/22/2014   pt. 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      Bipolar disorder (HCC)    with anxiety, depression   COPD (chronic obstructive pulmonary disease) (HCC)    Eczema 05/18/2022   Fibromyalgia 05/14/2014   GERD (gastroesophageal reflux disease)    History of kidney stones    Hyperlipidemia 04/20/2017   HYPERTENSION 05/12/2009   Qualifier: Diagnosis of  By: Truman Hayward Duncan Dull), [name]     Peripheral vascular disease (HCC)    Pneumonia    Prediabetes 02/23/2014   pt. denies   Seizure (HCC)    Stroke (HCC) 11/2020   Urticaria    Past Surgical History:  Procedure Laterality Date   ABDOMINAL HYSTERECTOMY     ANTERIOR CERVICAL DECOMP/DISCECTOMY FUSION N/A 07/28/2020   Procedure: ANTERIOR CERVICAL DECOMPRESSION/DISCECTOMY FUSION. INTERBODY PROTHESIS, PLATE/SCREWS CERVICAL THREE- CERVICAL FOUR, CERVICAL FOUR- CERVICAL FIVE;  Surgeon: Tressie Stalker, MD;  Location: [facility] OR;   Service: Neurosurgery;  Laterality: N/A;   BACK SURGERY     COLONOSCOPY  12/20/2011   Procedure: COLONOSCOPY;  Surgeon: Freddy Jaksch, MD;  Location: [facility];  Service: Endoscopy;  Laterality: N/A;   COLONOSCOPY  03/05/2012   Procedure: COLONOSCOPY;  Surgeon: Freddy Jaksch, MD;  Location: [facility];  Service: Endoscopy;  Laterality: N/A;   DIAGNOSTIC LAPAROSCOPY     HEMORRHOID SURGERY     INCISE AND DRAIN ABCESS     KIDNEY STONE SURGERY     NECK SURGERY     x 2 Dr Terrilee Files   SPINE SURGERY  2019   TOE SURGERY     TUBAL LIGATION     Patient Active Problem List   Diagnosis Date Noted   Cirrhosis (HCC) 03/07/2023   Vocal cord dysfunction 03/05/2023   Urinary incontinence 01/25/2023   Influenza A 01/11/2023   Bilateral impacted cerumen 12/04/2022   Not well controlled severe persistent asthma 10/25/2022   Acute on chronic respiratory failure with hypoxemia (HCC) 10/07/2022   Abnormal TSH 09/13/2022   Chronic sinusitis 08/14/2022   PND (paroxysmal nocturnal dyspnea) 06/07/2022   Vitamin D deficiency 05/20/2022   Eczema 05/18/2022   Insomnia 05/18/2022   Allergic conjunctivitis 05/18/2022   Chronic respiratory failure with hypoxia and hypercapnia (HCC) 04/30/2022   Bilateral hearing loss 04/14/2022   Moderate  persistent asthma without complication 02/02/2022   Low back pain 10/11/2021   Abnormal CXR 07/17/2021   Aortic atherosclerosis (HCC) 07/05/2021   URI (upper respiratory infection) 07/05/2021   Arthrodesis status 06/20/2021   Drug allergy 05/14/2021   Acute cough 05/14/2021   Bilateral leg weakness 04/17/2021   Status post cervical spinal fusion 12/27/2020   History of stroke 12/11/2020   Medication management 10/21/2020   Body mass index (BMI) 26.0-26.9, adult 08/23/2020   Healthcare maintenance 08/11/2020   Cervical spondylosis with myelopathy and radiculopathy 07/28/2020   Preop exam for internal medicine 06/19/2020   Hypotension due to medication  06/11/2020   Essential (primary) hypertension 05/17/2020   Other spondylosis with myelopathy, cervical region 02/16/2020   Cervical spondylosis 02/02/2020   Radiculopathy, cervical region 02/02/2020   Spondylolysis, cervical region 02/02/2020   Neck pain 02/02/2020   Leg cramping 08/12/2019   Laryngopharyngeal reflux (LPR) 05/26/2019   Sinus pressure 05/26/2019   Hypophosphatemia 01/07/2019   Vertigo 09/23/2018   Dysfunction of left eustachian tube 09/23/2018   Gait disorder 04/21/2018   Leukocytosis 04/15/2018   Nausea & vomiting 04/15/2018   Seizure disorder (HCC)    Prolapsed cervical intervertebral disc 03/11/2018   Cervical radiculopathy 02/18/2018   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   Syncope 09/04/2017   Constipation 09/04/2017   Nonintractable headache 09/04/2017   Leg swelling 07/26/2017   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   Hyperlipidemia 04/20/2017   Pedal edema 04/19/2017   Bipolar I disorder (HCC) 09/19/2016   Facial pain 07/12/2016   Rash 06/14/2016   Migraine 01/25/2016   Idiopathic urticaria/pruritus 01/23/2016   Allergic rhinitis 01/23/2016   Oral candidiasis 01/23/2016   Greater trochanteric bursitis of both hips 09/08/2015   Chest pain 06/22/2015   Itching 06/22/2015   Bilateral knee pain 06/22/2015   Bilateral hip pain 06/22/2015   Anemia, iron deficiency 12/22/2014   Fatigue 12/21/2014   Rash and nonspecific skin eruption 08/24/2014   Intertrigo 08/07/2014   Dyspareunia 07/12/2014   Menopausal syndrome (hot flushes) 07/12/2014   History of tobacco abuse 07/12/2014   Dizziness 06/22/2014   Weakness 06/22/2014   Fibromyalgia 05/14/2014   Encounter for well adult exam with abnormal findings 04/22/2014   Recurrent boils 04/22/2014   Recurrent falls 04/22/2014   Peripheral vascular disease (HCC)    Depression    Anxiety    Gastroesophageal reflux disease    Prediabetes 02/23/2014   COPD exacerbation (HCC) 02/23/2014   Screening mammogram  for high-risk patient 02/23/2014   Back pain 07/22/2013   COPD/asthma 08/24/2009   Headache(784.0) 08/24/2009   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    REFERRING DIAG:  R29.6 (ICD-10-CM) - Recurrent falls  R26.9 (ICD-10-CM) - Gait disorder    Diagnosis  M25.562,G89.29 (ICD-10-CM) - Chronic pain of left knee  M25.561,G89.29 (ICD-10-CM) - Chronic pain of right knee  M25.551,M25.552 (ICD-10-CM) - Bilateral hip pain    THERAPY DIAG:  Repeated falls  Muscle weakness (generalized)  Chronic pain of both knees  Pain of both hip joints  Rationale for Evaluation and Treatment Rehabilitation  PERTINENT HISTORY:  Severe asthma  COPD Bipolar Fibromyalgia History of stroke History of seizure, reports she has not experienced a seizure in over 2 years   PRECAUTIONS: fall   SUBJECTIVE:  SUBJECTIVE STATEMENT:  "I felt terrible. My breathing was off so bad. I tried walking this morning."   PAIN:  Are you having pain? None currently Yes: NPRS scale: 6 at worst/10 Pain location: bilateral knees and hips Pain description: sharp Aggravating factors: worse in the AM Relieving factors: medication     OBJECTIVE: (objective measures completed at initial evaluation unless otherwise dated)  DIAGNOSTIC FINDINGS:  Lt knee X-ray: 2 views of the left knee show normal alignment no acute findings.  There  is no effusion.    Rt knee X-ray: 2 views of the right knee are normal.  The alignment is normal and the  joint spaces well-maintained.    PATIENT SURVEYS:  FOTO 27% function to 52% predicted    COGNITION: Overall cognitive status: Within functional limits for tasks assessed                         SENSATION: Not tested   EDEMA:  No obvious swelling about bilateral knees    MUSCLE  LENGTH: Hamstrings: mild tightness bilaterally      POSTURE: not formally assessed    PALPATION: Diffuse tenderness about anterior knees    LOWER EXTREMITY ROM:   Active ROM Right eval Left eval  Hip flexion WNL WNL  Hip extension      Hip abduction      Hip adduction      Hip internal rotation      Hip external rotation      Knee flexion Full Full   Knee extension Full Full  Ankle dorsiflexion      Ankle plantarflexion      Ankle inversion      Ankle eversion       (Blank rows = not tested)   LOWER EXTREMITY MMT:   MMT Right eval Left eval 04/18/23  Hip flexion 4 4 4  bilateral   Hip extension Unable to lift leg in prone (activation noted) Unable to lift leg in prone (activation noted)    Hip abduction 4- 4-   Hip adduction       Hip internal rotation       Hip external rotation       Knee flexion 5 4+   Knee extension 5 4+   Ankle dorsiflexion       Ankle plantarflexion       Ankle inversion       Ankle eversion        (Blank rows = not tested)   LOWER EXTREMITY SPECIAL TESTS:  (+) McMurrays and Thessaly's RLE  (-) Anterior Drawer, Posterior Drawer bilaterally  (+) [place] bilaterally    FUNCTIONAL TESTS:  5 x STS: 21 seconds  SLS: RLE 11 seconds, LLE 30 seconds  DGI: 20/24  04/24/23: 5 x STS: 17.3 seconds  RLE SLS: 30 seconds    GAIT: Distance walked: 10 ft Assistive device utilized: None Level of assistance: Complete Independence Comments: WNL [facility] Adult PT Treatment:                                                DATE: 04/26/23 Therapeutic Exercise: Standing hip abduction 2 x 10; 2# Standing hip extension 2 x 10; 2# Standing HS curl 2 x 10; 2# Standing march 2 x 10; 2# LAQ 2 x 10; 2#  HS curl 2 x  10; green band  SL calf raise 2 x 10  SLS x 30 sec each     [facility] Adult PT Treatment:                                                DATE: 04/24/23 Therapeutic Exercise: Hip bridge with abduction green band 2 x 10  Standing calf raise 2 x 15  Standing  hip abduction 2 x 10  Standing hip extension 2 x 10  Standing march 2 x 10  Sit to stand 3 x 5  Prone quad stretch x 60 sec  LAQ 2 x 10; 2.5# Updated HEP   [facility] Adult PT Treatment:                                                DATE: 04/18/23 Therapeutic Exercise: Hip bridge 2 x 10  Sidelying hip abduction 2 x 10  SLR 2 x 10  LAQ 2 x 10 @ 2 lbs  Seated march 2 x 10 @ 2 lbs  HS curl green band 2 x 10  Sit to stand 1 x5; 1 x 10  Standing calf raises 2 x 10  Updated HEP     [facility] Adult PT Treatment:                                                DATE: 04/02/23 Therapeutic Exercise: Demonstrated and issue initial HEP.      Therapeutic Activity: Education on assessment findings that will be addressed throughout duration of POC.            PATIENT EDUCATION:  Education details: HEP review Person educated: Patient Education method: Explanation Education comprehension: verbalized understanding   HOME EXERCISE PROGRAM: Access Code: [email or web address] URL: [email or web address] Date: 04/02/2023 Prepared by: Letitia Libra   Exercises - Supine Bridge  - 1 x daily - 7 x weekly - 2 sets - 10 reps - Supine Active Straight Leg Raise  - 1 x daily - 7 x weekly - 2 sets - 10 reps - Hooklying Clamshell with Resistance  - 1 x daily - 7 x weekly - 2 sets - 10 reps   ASSESSMENT:   CLINICAL IMPRESSION: Patient arrives without reports of hip or knee pain, but endorses difficulty breathing due to her asthma with her morning walk requiring use of nebulizer and inhaler to improve her breathing. No breathing issues upon arrival. Today's session focused on hip and knee strengthening while allowing for rest breaks as needed to not cause exacerbation of her asthma. Following a few standing exercises patient reported feeling light-headed that was resolved with water and a peppermint as patient admits to not having anything to eat this morning. Able to continue with session without any issues. No  reports of knee or hip pain at conclusion.    OBJECTIVE IMPAIRMENTS: decreased activity tolerance, decreased balance, decreased endurance, decreased knowledge of condition, difficulty walking, decreased strength, impaired flexibility, improper body mechanics, and pain.    ACTIVITY LIMITATIONS: carrying, lifting, bending, standing, squatting, transfers, and locomotion level   PARTICIPATION LIMITATIONS: meal prep, cleaning,  laundry, shopping, community activity, and yard work   PERSONAL FACTORS: Age, Fitness, Time since onset of injury/illness/exacerbation, and 3+ comorbidities: see PMH above  are also affecting patient's functional outcome.    REHAB POTENTIAL: Good   CLINICAL DECISION MAKING: Evolving/moderate complexity   EVALUATION COMPLEXITY: Moderate     GOALS: Goals reviewed with patient? Yes   SHORT TERM GOALS: Target date: 04/23/2023     Patient will be independent and compliant with initial HEP.    Baseline: issued at eval Goal status: met   2.  Patient will complete 5 x STS in </ 15 seconds to improve her functional strength.  Baseline: see above  Goal status: progressing    3.  Patient will maintain Rt SLS for at least 20 seconds to improve stability when navigating on uneven terrain.  Baseline: see above  Goal status: met   LONG TERM GOALS: Target date: 05/18/23       Patient will report no instances of knee giving out when walking for at least 4 weeks, indicative of improvement in her current condition.  Baseline: random occurrences.  Goal status: INITIAL   2.  Patient will demonstrate at least 3+/5 bilateral hip extensor strength to improve stability about the chain with walking.  Baseline: see above  Goal status: INITIAL   3.  Patient will score at least 52% on FOTO to signify clinically meaningful improvement in functional abilities.    Baseline: see above  Goal status: INITIAL   4.  Patient will be able to complete step up from 4 inch step independently  to improve safety with navigating curbs in the community.  Baseline: requires assistance Goal status: INITIAL   5.  Patient will be independent with advanced home program to progress/maintain current level of function.  Baseline: initial HEP issued  Goal status: INITIAL       PLAN:   PT FREQUENCY: 1-2x/week   PT DURATION: 6 weeks   PLANNED INTERVENTIONS: Therapeutic exercises, Therapeutic activity, Neuromuscular re-education, Balance training, Gait training, Patient/Family education, Self Care, Joint mobilization, Dry Needling, Cryotherapy, Moist heat, Manual therapy, and Re-evaluation   PLAN FOR NEXT SESSION: review and progress HEP prn; hip/knee strengthening, balance. Be mindful of patient's severe asthma   Letitia Libra, PT, DPT, ATC 04/26/23 11:40 AM

## 2023-04-30 ENCOUNTER — Ambulatory Visit: Payer: Medicare HMO

## 2023-04-30 ENCOUNTER — Ambulatory Visit (INDEPENDENT_AMBULATORY_CARE_PROVIDER_SITE_OTHER): Payer: Medicare HMO

## 2023-04-30 DIAGNOSIS — J4550 Severe persistent asthma, uncomplicated: Secondary | ICD-10-CM

## 2023-05-02 ENCOUNTER — Ambulatory Visit: Payer: Self-pay

## 2023-05-02 DIAGNOSIS — F6381 Intermittent explosive disorder: Secondary | ICD-10-CM | POA: Diagnosis not present

## 2023-05-02 DIAGNOSIS — F310 Bipolar disorder, current episode hypomanic: Secondary | ICD-10-CM | POA: Diagnosis not present

## 2023-05-02 NOTE — Patient Outreach (Signed)
  Care Coordination   Follow Up Visit Note   05/02/2023 Name: Lakedra Discher MRN: [id number] DOB: [birth year]  Judson Roch Montezuma is a 62 y.o. year old female who sees Corwin Levins, MD for primary care. I spoke with  [name] by phone today.  What matters to the patients health and wellness today?  RNCM called to follow up. Ms. Runyan states this is not a good time to talk and reports she is having a bad day, adding her psychiatrist is prescribing something for her anxiety. She denies any care needs from [facility] at this time. She states, "I don't need anything, I am talking with my daughter on the other line right now" and request someone call her another time to reschedule this telephone call.    Goals Addressed             This Visit's Progress    care coordination activities       Interventions Today    Flowsheet Row Most Recent Value  General Interventions   General Interventions Discussed/Reviewed --  [referral to care guide to reschedule telephone call]            SDOH assessments and interventions completed:  No  Care Coordination Interventions:  Yes, provided   Follow up plan: Referral made to care guide to reschedule    Encounter Outcome:  Pt. Visit Completed   Kathyrn Sheriff, RN, MSN, BSN, CCM [facility] Care Coordinator [phone]

## 2023-05-03 ENCOUNTER — Ambulatory Visit: Payer: Medicare HMO

## 2023-05-06 ENCOUNTER — Ambulatory Visit (INDEPENDENT_AMBULATORY_CARE_PROVIDER_SITE_OTHER): Payer: Medicare HMO | Admitting: Nurse Practitioner

## 2023-05-06 ENCOUNTER — Encounter: Payer: Self-pay | Admitting: Nurse Practitioner

## 2023-05-06 ENCOUNTER — Ambulatory Visit (INDEPENDENT_AMBULATORY_CARE_PROVIDER_SITE_OTHER): Payer: Medicare HMO | Admitting: *Deleted

## 2023-05-06 VITALS — BP 132/80 | HR 91 | Temp 98.5°F | Ht 63.0 in | Wt 192.6 lb

## 2023-05-06 DIAGNOSIS — J441 Chronic obstructive pulmonary disease with (acute) exacerbation: Secondary | ICD-10-CM

## 2023-05-06 DIAGNOSIS — J4489 Other specified chronic obstructive pulmonary disease: Secondary | ICD-10-CM | POA: Diagnosis not present

## 2023-05-06 DIAGNOSIS — J383 Other diseases of vocal cords: Secondary | ICD-10-CM | POA: Diagnosis not present

## 2023-05-06 DIAGNOSIS — J9612 Chronic respiratory failure with hypercapnia: Secondary | ICD-10-CM | POA: Diagnosis not present

## 2023-05-06 DIAGNOSIS — J9611 Chronic respiratory failure with hypoxia: Secondary | ICD-10-CM | POA: Diagnosis not present

## 2023-05-06 DIAGNOSIS — J309 Allergic rhinitis, unspecified: Secondary | ICD-10-CM

## 2023-05-06 NOTE — Assessment & Plan Note (Signed)
Persistent voice hoarseness and upper airway instability. Did receive some benefit from SLP therapy. Needs to follow up with ENT. She will contact them for appt.

## 2023-05-06 NOTE — Patient Instructions (Addendum)
Continue Albuterol inhaler 2 puffs or 3 mL neb every 6 hours as needed for shortness of breath or wheezing. Notify if symptoms persist despite rescue inhaler/neb use. Use neb before your Breztri  Continue Breztri 2 puffs Twice daily. Brush tongue and rinse mouth afterwards Continue allergy pill as ordered by Dr. Dellis Anes  Continue flonase nasal spray 1-2 sprays each nostril Twice daily for allergies/nasal congestion Continue dupixent injections as scheduled Continue singulair 1 tab At bedtime  Restart BiPAP therapy at night - I am going to call [employer] so we can troubleshoot this and make it more comfortable for you  Return to ENT to discuss your voice hoarseness   Referral to pulmonary rehab placed today   We discussed a trial of daily prednisone but understand that you want to avoid this for right now.   Follow up in 6 weeks to see how restarting nighttime therapy is going with Dr. Delton Coombes or Katie Ferdie Bakken,NP. If symptoms do not improve or worsen, please contact office for sooner follow up or seek emergency care.

## 2023-05-06 NOTE — Progress Notes (Unsigned)
@Patient  ID: [name], female    DOB: 1961-07-24, 62 y.o.   MRN: [id number]  Chief Complaint  Patient presents with   Follow-up    Pt still having wheezing, pt states she is using using her inhalers and neb.     Referring provider: Corwin Levins, MD  HPI: 62 year old female, former smoker followed for COPD asthma overlap.  She is a patient Dr. Kavin Leech and last seen in office 03/05/2023.  Past medical history significant for allergic rhinitis, anxiety, depression, GERD, hypertension, PVD, prediabetes, seizure, history of stroke.  TEST/EVENTS:  06/10/2020 echocardiogram: EF 60-65%; diastolic parameters normal. RV size and function nl. Unable to measure PASP. Trivial MR.  03/08/2022 CT chest wo contrast: atherosclerosis. No LAD. Emphysematous disease is noted b/l. No suspicious nodules mentioned.  05/28/2022 CXR 1 view: lungs are mildly hyperinflated, progressive since previous. Lungs are clear with no acute process.  12/05/2022 PFT: FVC 45, FEV1 28, ratio 52, TLC 107, DLCOcor 49. Positive BD (27%) 02/22/2023 CT chest wo con: atherosclerosis. No LAD. No acute process. Diffuse bronchial wall thickening with mild to moderate emphysema. Underlying cirrhosis.  02/24/2023 echo: EF 60-65%. Normal diastolic parameters. RV size and function nl. Nl PASP.   03/05/2023: OV with Dr. Delton Coombes. Asthmatic/COPD and frequent exacerbations. Upper airway instability and obesity, GERD, chronic rhinitis.  She also has chronic hypercapnic respiratory failure which she is treated with BiPAP nightly.  She is in the hospital for acute exacerbation, discharged on 02/27/2023.  She was treated with Mikael Spray, budesonide and Brovana while in the hospital.  Also on daily pantoprazole.  Remains on Dupixent every 14 days as well as Xyzal, Flonase, Singulair, Pepcid.  Maintenance regimen includes Breo history.  Using DuoNeb approximately every other day.  Completed prednisone taper.  SLP evaluation today> laryngeal spasm on  their eval.  They did work on some relaxation techniques.  Unclear how much recent hospitalization exacerbation was related to upper airway instability versus her obstructive lung disease.  Did require BiPAP.  Improved with standard therapy including steroids.  Encouraged to continue follow-up with SLP.  05/06/2023: Today - follow up Patient resents today for follow-up. She has been feeling about the same since she was here last. No hospitalizations or exacerbations. She continues to struggle with her breathing with most activities. She has been a little more breathless in the morning over the past few months. She is okay at rest. She is working on breathing techniques that SLP reviewed with her. These do help her a lot when she starts to feel short winded. She thinks it also calms her anxiety. She wants to know if there's any specific exercises she can do for her lungs. She does notice some occasional wheeze, usually only when she's really exerting herself. She is still having voice hoarseness, which tends to worsen with more coughing. ENT told her she had some inflammation and encouraged she continue reflux precautions. She doesn't feel like she's noticed a huge change. She denies any cough, fevers, chills, hemoptysis, leg swelling, orthopnea, PND, palpitations. Sinus symptoms are stable. She is using her nebs 1-2 times a day. She stopped using NIV because she felt like she couldn't breathe on it. Felt like it was pushing too much air. She had called [employer] who said someone would come out to help troubleshoot but no one ever did.   Allergies  Allergen Reactions   Sulfa Antibiotics Nausea And Vomiting and Other (See Comments)   Venofer [Iron Sucrose] Hives    Immunization History  Administered Date(s) Administered   Influenza Split 10/23/2011   Influenza Whole 09/16/2012   Influenza,inj,Quad PF,6+ Mos 10/18/2014, 08/31/2015, 09/25/2016, 10/09/2017, 09/23/2018, 08/12/2019, 10/06/2020, 11/24/2021,  08/10/2022   PFIZER(Purple Top)SARS-COV-2 Vaccination 02/24/2020, 03/16/2020, 10/29/2020   PNEUMOCOCCAL CONJUGATE-20 06/15/2021   Pneumococcal Polysaccharide-23 12/02/2015   Td 04/22/2014   Zoster, Unspecified 11/19/2022    Past Medical History:  Diagnosis Date   Anemia, iron deficiency 12/22/2014   pt. 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      Bipolar disorder (HCC)    with anxiety, depression   COPD (chronic obstructive pulmonary disease) (HCC)    Eczema 05/18/2022   Fibromyalgia 05/14/2014   GERD (gastroesophageal reflux disease)    History of kidney stones    Hyperlipidemia 04/20/2017   HYPERTENSION 05/12/2009   Qualifier: Diagnosis of  By: Truman Hayward Duncan Dull), [name]     Peripheral vascular disease (HCC)    Pneumonia    Prediabetes 02/23/2014   pt. denies   Seizure (HCC)    Stroke (HCC) 11/2020   Urticaria     Tobacco History: Social History   Tobacco Use  Smoking Status Former   Packs/day: 0.25   Years: 36.00   Additional pack years: 0.00   Total pack years: 9.00   Types: Cigarettes   Quit date: 01/21/2019   Years since quitting: 4.2  Smokeless Tobacco Never  Tobacco Comments   Passive smoker.  Patient states her quit date was 05/09/2018.  Patient educated with resources at today's appointment to continue to support her to stop smoking.   Counseling given: Not Answered Tobacco comments: Passive smoker.  Patient states her quit date was 05/09/2018.  Patient educated with resources at today's appointment to continue to support her to stop smoking.   Outpatient Medications Prior to Visit  Medication Sig Dispense Refill   albuterol (VENTOLIN HFA) 108 (90 Base) MCG/ACT inhaler INHALE 2 PUFFS INTO THE LUNGS EVERY 6 HOURS AS NEEDED FOR WHEEZING OR SHORTNESS OF BREATH 18 g 1    atorvastatin (LIPITOR) 40 MG tablet Take 1 tablet (40 mg total) by mouth daily. TAKE 1 TABLET(40 MG) BY MOUTH DAILY Strength: 40 mg (Patient taking differently: Take 40 mg by mouth daily.) 90 tablet 3   azelastine (OPTIVAR) 0.05 % ophthalmic solution Place 1 drop into both eyes 2 (two) times daily. 6 mL 12   BREZTRI AEROSPHERE 160-9-4.8 MCG/ACT AERO Inhale 2 puffs into the lungs in the morning and at bedtime.     carbamazepine (TEGRETOL) 200 MG tablet Take 2-3 tablets (400-600 mg total) by mouth See admin instructions. Take 400mg  in the morning and 600mg  at bedtime. 150 tablet 2   cetirizine (ZYRTEC) 10 MG tablet Can take one tablet by mouth once daily if needed for runny nose. 30 tablet 5   Cholecalciferol (VITAMIN D) 125 MCG (5000 UT) CAPS Take 5,000 Units by mouth daily.     famotidine (PEPCID) 40 MG tablet Take 1 tablet (40 mg total) by mouth at bedtime. 30 tablet 3   ferrous sulfate 325 (65 FE) MG EC tablet Take 1 tablet (325 mg total) by mouth daily with breakfast. 90 tablet 1   fluticasone (FLONASE) 50 MCG/ACT nasal spray Place 1 spray into both nostrils 2 (two) times daily as needed for allergies or rhinitis. 16 g 5   gabapentin (NEURONTIN) 100  MG capsule Take 1 capsule (100 mg total) by mouth 3 (three) times daily. 90 capsule 3   hydrochlorothiazide (MICROZIDE) 12.5 MG capsule Take 1 capsule (12.5 mg total) by mouth daily. TAKE 1 CAPSULE(12.5 MG) BY MOUTH DAILY Strength: 12.5 mg (Patient taking differently: Take 12.5 mg by mouth daily.) 90 capsule 3   hydrOXYzine (ATARAX) 25 MG tablet Take 2 tablets (50 mg total) by mouth at bedtime for 30 doses. 60 tablet 3   ipratropium (ATROVENT HFA) 17 MCG/ACT inhaler 2 puffs every 4-6 hours as needed. 12.9 g 5   ipratropium-albuterol (DUONEB) 0.5-2.5 (3) MG/3ML SOLN Take 3 mLs by nebulization 2 (two) times daily. 360 mL 3   irbesartan (AVAPRO) 75 MG tablet TAKE 1 TABLET(75 MG) BY MOUTH DAILY (Patient taking differently: Take 75 mg by mouth daily.) 90  tablet 1   levocetirizine (XYZAL) 5 MG tablet Take 5 mg by mouth daily.     montelukast (SINGULAIR) 10 MG tablet Take 1 tablet (10 mg total) by mouth at bedtime. 90 tablet 1   pantoprazole (PROTONIX) 40 MG tablet Take 1 tablet (40 mg total) by mouth 2 (two) times daily. 60 tablet 3   Skin Protectants, Misc. (EUCERIN) cream Apply 1 Application topically daily as needed for dry skin.     tacrolimus (PROTOPIC) 0.03 % ointment Apply 1 Application topically 2 (two) times daily as needed (rash). 60 g 5   triamcinolone ointment (KENALOG) 0.1 % Apply 1 Application topically at bedtime. 30 g 5   revefenacin (YUPELRI) 175 MCG/3ML nebulizer solution Take 3 mLs (175 mcg total) by nebulization daily. 90 mL 5   Facility-Administered Medications Prior to Visit  Medication Dose Route Frequency Provider Last Rate Last Admin   dupilumab (DUPIXENT) prefilled syringe 300 mg  300 mg Subcutaneous Q14 Days Alfonse Spruce, MD   300 mg at 04/30/23 1501     Review of Systems:   Constitutional: No weight loss or gain, night sweats, fevers, chills, or lassitude. +fatigue HEENT: No headaches, difficulty swallowing, tooth/dental problems, or sore throat. No sneezing, itching, ear ache. +nasal congestion, post nasal drip (stable). +voice hoarseness CV:  +occasional PND (baseline). No chest pain, orthopnea, swelling in lower extremities, anasarca, dizziness, palpitations, syncope Resp: +shortness of breath with exertion (baseline); occasional exertional wheeze. No excess mucus or change in color of mucus. No productive or non-productive. No hemoptysis. No chest wall deformity GI:  No heartburn, indigestion, abdominal pain, nausea, vomiting, diarrhea, change in bowel habits, loss of appetite, bloody stools.  Skin: No rash, lesions, ulcerations MSK:  No joint pain or swelling.  No decreased range of motion.  +left back pain (baseline) Neuro: No dizziness or lightheadedness.  Psych: No depression. +anxiety. No SI/HI.  Mood stable.     Physical Exam:  BP 132/80   Pulse 91   Temp 98.5 F (36.9 C) (Oral)   Ht 5\' 3"  (1.6 m)   Wt 192 lb 9.6 oz (87.4 kg)   SpO2 96%   BMI 34.12 kg/m   GEN: Pleasant, interactive, well-appearing; in no acute distress. HEENT:  Normocephalic and atraumatic. PERRLA. Sclera white. Nasal turbinates pink, moist and patent bilaterally. No rhinorrhea present. Oropharynx pink and moist, without exudate or edema. No lesions, ulcerations, or postnasal drip.  NECK:  Supple w/ fair ROM. No JVD present. Normal carotid impulses w/o bruits. Thyroid symmetrical with no goiter or nodules palpated. No lymphadenopathy.   CV: RRR, no m/r/g, no peripheral edema. Pulses intact, +2 bilaterally. No cyanosis, pallor or clubbing. PULMONARY:  Unlabored, regular breathing. Diminished bilaterally A&P w/o wheezes/rales/rhonchi. No accessory muscle use. No dullness to percussion. GI: BS present and normoactive. Soft, non-tender to palpation. No organomegaly or masses detected.  MSK: No erythema, warmth or tenderness. Cap refil <2 sec all extrem. No deformities or joint swelling noted.  Neuro: A/Ox3. No focal deficits noted.   Skin: Warm, no lesions or rashe Psych: Normal affect and behavior. Judgement and thought content appropriate.     Lab Results:  CBC    Component Value Date/Time   WBC 6.2 02/22/2023 0525   RBC 4.13 02/22/2023 0525   HGB 12.6 02/22/2023 0525   HGB 11.1 10/18/2022 1216   HCT 38.6 02/22/2023 0525   HCT 34.4 10/18/2022 1216   PLT 327 02/22/2023 0525   PLT 431 10/18/2022 1216   MCV 93.5 02/22/2023 0525   MCV 83 10/18/2022 1216   MCH 30.5 02/22/2023 0525   MCHC 32.6 02/22/2023 0525   RDW 13.6 02/22/2023 0525   RDW 18.5 (H) 10/18/2022 1216   LYMPHSABS 3.4 02/22/2023 0525   LYMPHSABS 2.3 10/18/2022 1216   MONOABS 0.4 02/22/2023 0525   EOSABS 0.2 02/22/2023 0525   EOSABS 0.1 10/18/2022 1216   BASOSABS 0.0 02/22/2023 0525   BASOSABS 0.0 10/18/2022 1216    BMET     Component Value Date/Time   NA 136 02/27/2023 0220   NA 144 07/31/2019 1151   K 3.8 02/27/2023 0220   CL 100 02/27/2023 0220   CO2 25 02/27/2023 0220   GLUCOSE 107 (H) 02/27/2023 0220   BUN 16 02/27/2023 0220   BUN 11 07/31/2019 1151   CREATININE 0.72 02/27/2023 0220   CREATININE 0.71 02/23/2014 1054   CALCIUM 8.8 (L) 02/27/2023 0220   GFRNONAA >60 02/27/2023 0220   GFRNONAA >89 02/23/2014 1054   GFRAA >60 07/19/2020 1113   GFRAA >89 02/23/2014 1054    BNP    Component Value Date/Time   BNP 21.1 02/23/2023 1809     Imaging:  No results found.  dupilumab (DUPIXENT) prefilled syringe 300 mg     Date Action Dose Route User   04/30/2023 1501 Given 300 mg Subcutaneous (Left Arm) Dub Mikes, LPN   5/78/4696 1100 Given 300 mg Subcutaneous (Right Arm) Ma Hillock, CMA   04/01/2023 1551 Given 300 mg Subcutaneous (Left Arm) Ma Hillock, CMA   03/18/2023 1016 Given 300 mg Subcutaneous (Right Arm) Ma Hillock, CMA          Latest Ref Rng & Units 12/05/2022    2:41 PM 05/02/2018    3:58 PM  PFT Results  FVC-Pre L 1.42  1.92   FVC-Predicted Pre % 45  75   FVC-Post L 1.68    FVC-Predicted Post % 54    Pre FEV1/FVC % % 48  55   Post FEV1/FCV % % 52    FEV1-Pre L 0.68  1.06   FEV1-Predicted Pre % 28  52   FEV1-Post L 0.87    DLCO uncorrected ml/min/mmHg 8.64    DLCO UNC% % 45    DLCO corrected ml/min/mmHg 9.38    DLCO COR %Predicted % 49    DLVA Predicted % 71    TLC L 5.12    TLC % Predicted % 107    RV % Predicted % 189      Lab Results  Component Value Date   NITRICOXIDE 19 01/14/2018        Assessment & Plan:   Asthma-COPD overlap syndrome Very severe obstructive  lung disease with asthma overlap. High symptom burden that is felt to be multifactorial related to lung disease, upper airway instability, deconditioning, obesity. She is maintained on triple therapy regimen, biologic therapy with dupixent,  and singulair. We discussed a trial of daily prednisone to see if she has any perceived benefit but she would prefer to not do this right now. I am going to send a referral to pulmonary rehab, which she was eager to participate in. Encouraged her to work on graded exercises and continue physical therapy. Action plan in place.   Patient Instructions  Continue Albuterol inhaler 2 puffs or 3 mL neb every 6 hours as needed for shortness of breath or wheezing. Notify if symptoms persist despite rescue inhaler/neb use. Use neb before your Breztri  Continue Breztri 2 puffs Twice daily. Brush tongue and rinse mouth afterwards Continue allergy pill as ordered by Dr. Dellis Anes  Continue flonase nasal spray 1-2 sprays each nostril Twice daily for allergies/nasal congestion Continue dupixent injections as scheduled Continue singulair 1 tab At bedtime  Restart BiPAP therapy at night - I am going to call [employer] so we can troubleshoot this and make it more comfortable for you  Return to ENT to discuss your voice hoarseness   Referral to pulmonary rehab placed today   We discussed a trial of daily prednisone but understand that you want to avoid this for right now.   Follow up in 6 weeks to see how restarting nighttime therapy is going with Dr. Delton Coombes or Katie Lorna Strother,NP. If symptoms do not improve or worsen, please contact office for sooner follow up or seek emergency care.    Chronic respiratory failure with hypoxia and hypercapnia (HCC) Off NIV. Possible this is contributing to her AM dyspnea. We will contact [employer] to come troubleshoot and adjust her settings. Encouraged her to resume therapy. Reviewed benefits of NIV and role of reducing future exacerbations. No daytime oxygen requirement. Goal O2 level >88-90%  Vocal cord dysfunction Persistent voice hoarseness and upper airway instability. Did receive some benefit from SLP therapy. Needs to follow up with ENT. She will contact them for appt.   Allergic  rhinitis Stable on current regimen. Follow up with allergist as scheduled.     I spent 35 minutes of dedicated to the care of this patient on the date of this encounter to include pre-visit review of records, face-to-face time with the patient discussing conditions above, post visit ordering of testing, clinical documentation with the electronic health record, making appropriate referrals as documented, and communicating necessary findings to members of the patients care team.  Noemi Chapel, NP 05/07/2023  Pt aware and understands NP's role.

## 2023-05-06 NOTE — Assessment & Plan Note (Signed)
Off NIV. Possible this is contributing to her AM dyspnea. We will contact [employer] to come troubleshoot and adjust her settings. Encouraged her to resume therapy. Reviewed benefits of NIV and role of reducing future exacerbations. No daytime oxygen requirement. Goal O2 level >88-90%

## 2023-05-06 NOTE — Assessment & Plan Note (Signed)
Very severe obstructive lung disease with asthma overlap. High symptom burden that is felt to be multifactorial related to lung disease, upper airway irritability, deconditioning, obesity. She is maintained on triple therapy regimen, biologic therapy with dupixent, and singulair. We discussed a trial of daily prednisone to see if she has any perceived benefit but she would prefer to not do this right now. I am going to send a referral to pulmonary rehab, which she was eager to participate in. Encouraged her to work on graded exercises and continue physical therapy. Action plan in place.   Patient Instructions  Continue Albuterol inhaler 2 puffs or 3 mL neb every 6 hours as needed for shortness of breath or wheezing. Notify if symptoms persist despite rescue inhaler/neb use. Use neb before your Breztri  Continue Breztri 2 puffs Twice daily. Brush tongue and rinse mouth afterwards Continue allergy pill as ordered by Dr. Dellis Anes  Continue flonase nasal spray 1-2 sprays each nostril Twice daily for allergies/nasal congestion Continue dupixent injections as scheduled Continue singulair 1 tab At bedtime  Restart BiPAP therapy at night - I am going to call [employer] so we can troubleshoot this and make it more comfortable for you  Return to ENT to discuss your voice hoarseness   Referral to pulmonary rehab placed today   We discussed a trial of daily prednisone but understand that you want to avoid this for right now.   Follow up in 6 weeks to see how restarting nighttime therapy is going with Dr. Delton Coombes or Katie Tehani Mersman,NP. If symptoms do not improve or worsen, please contact office for sooner follow up or seek emergency care.

## 2023-05-07 ENCOUNTER — Encounter: Payer: Self-pay | Admitting: Nurse Practitioner

## 2023-05-07 ENCOUNTER — Ambulatory Visit: Payer: Medicare HMO

## 2023-05-07 DIAGNOSIS — R296 Repeated falls: Secondary | ICD-10-CM | POA: Diagnosis not present

## 2023-05-07 DIAGNOSIS — M6281 Muscle weakness (generalized): Secondary | ICD-10-CM

## 2023-05-07 DIAGNOSIS — M25561 Pain in right knee: Secondary | ICD-10-CM | POA: Diagnosis not present

## 2023-05-07 DIAGNOSIS — M25551 Pain in right hip: Secondary | ICD-10-CM | POA: Diagnosis not present

## 2023-05-07 DIAGNOSIS — M25552 Pain in left hip: Secondary | ICD-10-CM | POA: Diagnosis not present

## 2023-05-07 DIAGNOSIS — G8929 Other chronic pain: Secondary | ICD-10-CM | POA: Diagnosis not present

## 2023-05-07 DIAGNOSIS — M25562 Pain in left knee: Secondary | ICD-10-CM | POA: Diagnosis not present

## 2023-05-07 NOTE — Patient Instructions (Signed)

## 2023-05-07 NOTE — Therapy (Addendum)
OUTPATIENT PHYSICAL THERAPY TREATMENT NOTE  PHYSICAL THERAPY DISCHARGE SUMMARY  Visits from Start of Care: 5  Current functional level related to goals / functional outcomes: See goals below   Remaining deficits: Status unknown    Education / Equipment: N/A   Patient agrees to discharge. Patient goals were partially met. Patient is being discharged due to not returning since the last visit.    Patient Name: [name] MRN: [id number] DOB:19-Feb-1961, 62 y.o., female Today's Date: 05/07/2023  PCP: Corwin Levins, MD REFERRING PROVIDER:  Corwin Levins, MD (fall,gait) Kathryne Hitch, MD (hip, knee)   END OF SESSION:   PT End of Session - 05/07/23 1147     Visit Number 5    Number of Visits 13    Date for PT Re-Evaluation 05/18/23    Authorization Type humana MCR; MCD    Authorization Time Period 4/29-6/15/24    Authorization - Visit Number 4    Authorization - Number of Visits 12    PT Start Time 1147    PT Stop Time 1230    PT Time Calculation (min) 43 min    Behavior During Therapy [facility] for tasks assessed/performed                Past Medical History:  Diagnosis Date   Anemia, iron deficiency 12/22/2014   pt. 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      Bipolar disorder (HCC)    with anxiety, depression   COPD (chronic obstructive pulmonary disease) (HCC)    Eczema 05/18/2022   Fibromyalgia 05/14/2014   GERD (gastroesophageal reflux disease)    History of kidney stones    Hyperlipidemia 04/20/2017   HYPERTENSION 05/12/2009   Qualifier: Diagnosis of  By: Truman Hayward Duncan Dull), [name]     Peripheral vascular disease (HCC)    Pneumonia    Prediabetes 02/23/2014   pt. denies   Seizure (HCC)    Stroke (HCC) 11/2020   Urticaria    Past  Surgical History:  Procedure Laterality Date   ABDOMINAL HYSTERECTOMY     ANTERIOR CERVICAL DECOMP/DISCECTOMY FUSION N/A 07/28/2020   Procedure: ANTERIOR CERVICAL DECOMPRESSION/DISCECTOMY FUSION. INTERBODY PROTHESIS, PLATE/SCREWS CERVICAL THREE- CERVICAL FOUR, CERVICAL FOUR- CERVICAL FIVE;  Surgeon: Tressie Stalker, MD;  Location: [facility] OR;  Service: Neurosurgery;  Laterality: N/A;   BACK SURGERY     COLONOSCOPY  12/20/2011   Procedure: COLONOSCOPY;  Surgeon: Freddy Jaksch, MD;  Location: [facility];  Service: Endoscopy;  Laterality: N/A;   COLONOSCOPY  03/05/2012   Procedure: COLONOSCOPY;  Surgeon: Freddy Jaksch, MD;  Location: [facility]OSCOPY;  Service: Endoscopy;  Laterality: N/A;   DIAGNOSTIC LAPAROSCOPY     HEMORRHOID SURGERY     INCISE AND DRAIN ABCESS     KIDNEY STONE SURGERY     NECK SURGERY     x 2 Dr Terrilee Files   SPINE SURGERY  2019   TOE SURGERY     TUBAL LIGATION     Patient Active Problem List   Diagnosis Date Noted   Cirrhosis (HCC) 03/07/2023   Vocal cord dysfunction 03/05/2023   Urinary incontinence 01/25/2023   Influenza A 01/11/2023   Bilateral impacted cerumen 12/04/2022   Not well controlled severe persistent asthma 10/25/2022   Acute on chronic respiratory failure with hypoxemia (HCC)  10/07/2022   Abnormal TSH 09/13/2022   Chronic sinusitis 08/14/2022   PND (paroxysmal nocturnal dyspnea) 06/07/2022   Vitamin D deficiency 05/20/2022   Eczema 05/18/2022   Insomnia 05/18/2022   Allergic conjunctivitis 05/18/2022   Chronic respiratory failure with hypoxia and hypercapnia (HCC) 04/30/2022   Bilateral hearing loss 04/14/2022   Moderate persistent asthma without complication 02/02/2022   Low back pain 10/11/2021   Abnormal CXR 07/17/2021   Aortic atherosclerosis (HCC) 07/05/2021   URI (upper respiratory infection) 07/05/2021   Arthrodesis status 06/20/2021   Drug allergy 05/14/2021   Acute cough 05/14/2021   Bilateral leg weakness 04/17/2021   Status post  cervical spinal fusion 12/27/2020   History of stroke 12/11/2020   Medication management 10/21/2020   Body mass index (BMI) 26.0-26.9, adult 08/23/2020   Healthcare maintenance 08/11/2020   Cervical spondylosis with myelopathy and radiculopathy 07/28/2020   Preop exam for internal medicine 06/19/2020   Hypotension due to medication 06/11/2020   Essential (primary) hypertension 05/17/2020   Other spondylosis with myelopathy, cervical region 02/16/2020   Cervical spondylosis 02/02/2020   Radiculopathy, cervical region 02/02/2020   Spondylolysis, cervical region 02/02/2020   Neck pain 02/02/2020   Leg cramping 08/12/2019   Laryngopharyngeal reflux (LPR) 05/26/2019   Sinus pressure 05/26/2019   Hypophosphatemia 01/07/2019   Vertigo 09/23/2018   Dysfunction of left eustachian tube 09/23/2018   Gait disorder 04/21/2018   Leukocytosis 04/15/2018   Nausea & vomiting 04/15/2018   Seizure disorder (HCC)    Prolapsed cervical intervertebral disc 03/11/2018   Cervical radiculopathy 02/18/2018   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   Syncope 09/04/2017   Constipation 09/04/2017   Nonintractable headache 09/04/2017   Leg swelling 07/26/2017   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   Hyperlipidemia 04/20/2017   Pedal edema 04/19/2017   Bipolar I disorder (HCC) 09/19/2016   Facial pain 07/12/2016   Rash 06/14/2016   Migraine 01/25/2016   Idiopathic urticaria/pruritus 01/23/2016   Allergic rhinitis 01/23/2016   Oral candidiasis 01/23/2016   Greater trochanteric bursitis of both hips 09/08/2015   Chest pain 06/22/2015   Itching 06/22/2015   Bilateral knee pain 06/22/2015   Bilateral hip pain 06/22/2015   Anemia, iron deficiency 12/22/2014   Fatigue 12/21/2014   Rash and nonspecific skin eruption 08/24/2014   Intertrigo 08/07/2014   Dyspareunia 07/12/2014   Menopausal syndrome (hot flushes) 07/12/2014   History of tobacco abuse 07/12/2014   Dizziness 06/22/2014   Weakness 06/22/2014    Fibromyalgia 05/14/2014   Encounter for well adult exam with abnormal findings 04/22/2014   Recurrent boils 04/22/2014   Recurrent falls 04/22/2014   Peripheral vascular disease (HCC)    Depression    Anxiety    Gastroesophageal reflux disease    Prediabetes 02/23/2014   COPD exacerbation (HCC) 02/23/2014   Screening mammogram for high-risk patient 02/23/2014   Back pain 07/22/2013   COPD/asthma 08/24/2009   Headache(784.0) 08/24/2009   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    REFERRING DIAG:  R29.6 (ICD-10-CM) - Recurrent falls  R26.9 (ICD-10-CM) - Gait disorder    Diagnosis  M25.562,G89.29 (ICD-10-CM) - Chronic pain of left knee  M25.561,G89.29 (ICD-10-CM) - Chronic pain of right knee  M25.551,M25.552 (ICD-10-CM) - Bilateral hip pain    THERAPY DIAG:  Repeated falls  Muscle weakness (generalized)  Chronic pain of both knees  Pain of both hip joints  Rationale for Evaluation and Treatment Rehabilitation  PERTINENT HISTORY:  Severe asthma  COPD Bipolar Fibromyalgia History of stroke History of seizure, reports she has  not experienced a seizure in over 2 years   PRECAUTIONS: fall   SUBJECTIVE:                                                                                                                                                                                      SUBJECTIVE STATEMENT:  "I was sick. I had a rough week with my lungs." She reports the knees and hips feel good without pain currently and feels that her pain is getting better.    PAIN:  Are you having pain? None currently Yes: NPRS scale: 3 at worst/10 Pain location: bilateral knees and hips Pain description: sharp Aggravating factors: worse in the AM Relieving factors: medication     OBJECTIVE: (objective measures completed at initial evaluation unless otherwise dated)  DIAGNOSTIC FINDINGS:  Lt knee X-ray: 2 views of the left knee show normal alignment no acute findings.  There   is no effusion.    Rt knee X-ray: 2 views of the right knee are normal.  The alignment is normal and the  joint spaces well-maintained.    PATIENT SURVEYS:  FOTO 27% function to 52% predicted    COGNITION: Overall cognitive status: Within functional limits for tasks assessed                         SENSATION: Not tested   EDEMA:  No obvious swelling about bilateral knees    MUSCLE LENGTH: Hamstrings: mild tightness bilaterally      POSTURE: not formally assessed    PALPATION: Diffuse tenderness about anterior knees    LOWER EXTREMITY ROM:   Active ROM Right eval Left eval  Hip flexion WNL WNL  Hip extension      Hip abduction      Hip adduction      Hip internal rotation      Hip external rotation      Knee flexion Full Full   Knee extension Full Full  Ankle dorsiflexion      Ankle plantarflexion      Ankle inversion      Ankle eversion       (Blank rows = not tested)   LOWER EXTREMITY MMT:   MMT Right eval Left eval 04/18/23 05/07/23  Hip flexion 4 4 4  bilateral    Hip extension Unable to lift leg in prone (activation noted) Unable to lift leg in prone (activation noted)   3+ bilateral   Hip abduction 4- 4-    Hip adduction        Hip internal rotation        Hip external rotation  Knee flexion 5 4+    Knee extension 5 4+    Ankle dorsiflexion        Ankle plantarflexion        Ankle inversion        Ankle eversion         (Blank rows = not tested)   LOWER EXTREMITY SPECIAL TESTS:  (+) McMurrays and Thessaly's RLE  (-) Anterior Drawer, Posterior Drawer bilaterally  (+) [place] bilaterally    FUNCTIONAL TESTS:  5 x STS: 21 seconds  SLS: RLE 11 seconds, LLE 30 seconds  DGI: 20/24  04/24/23: 5 x STS: 17.3 seconds  RLE SLS: 30 seconds    GAIT: Distance walked: 10 ft Assistive device utilized: None Level of assistance: Complete Independence Comments: WNL [facility] Adult PT Treatment:                                                DATE:  05/07/23 Therapeutic Exercise: Prone hip extension 2 x 10  Sidelying hip circles 2 x 10; CW/CCW Standing resisted hip abduction red band 2 x 10  Standing resisted hip extension red band 2 x 10  Resisted knee extension 3 x 10 @ 15 lbs  Updated HEP  Manual Therapy: STM Rt gluteals, piriformis Demo and returned demo of use of tennis ball for self soft tissue mobilization Trigger Point Dry Needling Treatment: Pre-treatment instruction: Patient instructed on dry needling rationale, procedures, and possible side effects including pain during treatment (achy,cramping feeling), bruising, drop of blood, lightheadedness, nausea, sweating. Patient Consent Given: Yes Education handout provided: Yes Muscles treated: Rt gluteals   Needle size and number: .30x128mm x 1 Electrical stimulation performed: No Parameters: N/A Treatment response/outcome: Palpable decrease in muscle tension Post-treatment instructions: Patient instructed to expect possible mild to moderate muscle soreness later today and/or tomorrow. Patient instructed in methods to reduce muscle soreness and to continue prescribed HEP. If patient was dry needled over the lung field, patient was instructed on signs and symptoms of pneumothorax and, however unlikely, to see immediate medical attention should they occur. Patient was also educated on signs and symptoms of infection and to seek medical attention should they occur. Patient verbalized understanding of these instructions and education.    [facility] Adult PT Treatment:                                                DATE: 04/26/23 Therapeutic Exercise: Standing hip abduction 2 x 10; 2# Standing hip extension 2 x 10; 2# Standing HS curl 2 x 10; 2# Standing march 2 x 10; 2# LAQ 2 x 10; 2#  HS curl 2 x 10; green band  SL calf raise 2 x 10  SLS x 30 sec each     [facility] Adult PT Treatment:                                                DATE: 04/24/23 Therapeutic Exercise: Hip bridge with  abduction green band 2 x 10  Standing calf raise 2 x 15  Standing hip abduction 2 x 10  Standing  hip extension 2 x 10  Standing march 2 x 10  Sit to stand 3 x 5  Prone quad stretch x 60 sec  LAQ 2 x 10; 2.5# Updated HEP   [facility] Adult PT Treatment:                                                DATE: 04/18/23 Therapeutic Exercise: Hip bridge 2 x 10  Sidelying hip abduction 2 x 10  SLR 2 x 10  LAQ 2 x 10 @ 2 lbs  Seated march 2 x 10 @ 2 lbs  HS curl green band 2 x 10  Sit to stand 1 x5; 1 x 10  Standing calf raises 2 x 10  Updated HEP        PATIENT EDUCATION:  Education details: HEP update Person educated: Patient Education method: Explanation, demo, cues, handout Education comprehension: verbalized understanding, returned demo    HOME EXERCISE PROGRAM: Access Code: [email or web address] URL: [email or web address] Date: 04/02/2023 Prepared by: Letitia Libra   Exercises - Supine Bridge  - 1 x daily - 7 x weekly - 2 sets - 10 reps - Supine Active Straight Leg Raise  - 1 x daily - 7 x weekly - 2 sets - 10 reps - Hooklying Clamshell with Resistance  - 1 x daily - 7 x weekly - 2 sets - 10 reps   ASSESSMENT:   CLINICAL IMPRESSION: Patient returns to PT after 1 week lapse in care due to unrelated COPD/asthma issues last week. She reports overall her hips and knees are feeling better.She reports Rt posterolateral hip pain with sidelying on the Rt that is reduced after manual therapy and TPDN to the Rt gluteals. Able to progress standing hip strengthening with patient demonstrating good stability. Hip extensor strength has significantly improved compared to baseline, having met this LTG.    OBJECTIVE IMPAIRMENTS: decreased activity tolerance, decreased balance, decreased endurance, decreased knowledge of condition, difficulty walking, decreased strength, impaired flexibility, improper body mechanics, and pain.    ACTIVITY LIMITATIONS: carrying, lifting, bending, standing,  squatting, transfers, and locomotion level   PARTICIPATION LIMITATIONS: meal prep, cleaning, laundry, shopping, community activity, and yard work   PERSONAL FACTORS: Age, Fitness, Time since onset of injury/illness/exacerbation, and 3+ comorbidities: see PMH above  are also affecting patient's functional outcome.    REHAB POTENTIAL: Good   CLINICAL DECISION MAKING: Evolving/moderate complexity   EVALUATION COMPLEXITY: Moderate     GOALS: Goals reviewed with patient? Yes   SHORT TERM GOALS: Target date: 04/23/2023     Patient will be independent and compliant with initial HEP.    Baseline: issued at eval Goal status: met   2.  Patient will complete 5 x STS in </ 15 seconds to improve her functional strength.  Baseline: see above  Goal status: progressing    3.  Patient will maintain Rt SLS for at least 20 seconds to improve stability when navigating on uneven terrain.  Baseline: see above  Goal status: met   LONG TERM GOALS: Target date: 05/18/23       Patient will report no instances of knee giving out when walking for at least 4 weeks, indicative of improvement in her current condition.  Baseline: random occurrences.  Goal status: INITIAL   2.  Patient will demonstrate at least 3+/5 bilateral hip extensor strength to improve stability about the  chain with walking.  Baseline: see above  Goal status: met   3.  Patient will score at least 52% on FOTO to signify clinically meaningful improvement in functional abilities.    Baseline: see above  Goal status: INITIAL   4.  Patient will be able to complete step up from 4 inch step independently to improve safety with navigating curbs in the community.  Baseline: requires assistance Goal status: INITIAL   5.  Patient will be independent with advanced home program to progress/maintain current level of function.  Baseline: initial HEP issued  Goal status: INITIAL       PLAN:   PT FREQUENCY: n/a   PT DURATION: n/a    PLANNED INTERVENTIONS: Therapeutic exercises, Therapeutic activity, Neuromuscular re-education, Balance training, Gait training, Patient/Family education, Self Care, Joint mobilization, Dry Needling, Cryotherapy, Moist heat, Manual therapy, and Re-evaluation   PLAN FOR NEXT SESSION: n/a  Letitia Libra, PT, DPT, ATC 05/07/23 12:45 PM

## 2023-05-07 NOTE — Assessment & Plan Note (Signed)
Stable on current regimen. Follow up with allergist as scheduled.

## 2023-05-08 DIAGNOSIS — F6381 Intermittent explosive disorder: Secondary | ICD-10-CM | POA: Diagnosis not present

## 2023-05-10 ENCOUNTER — Ambulatory Visit: Payer: Medicare HMO

## 2023-05-10 ENCOUNTER — Telehealth (HOSPITAL_COMMUNITY): Payer: Self-pay

## 2023-05-10 NOTE — Telephone Encounter (Signed)
Called patient to see if she is interested in the Pulmonary Rehab Program. Patient expressed interest. Explained scheduling process, patient verbalized understanding.  ?

## 2023-05-14 ENCOUNTER — Ambulatory Visit (INDEPENDENT_AMBULATORY_CARE_PROVIDER_SITE_OTHER): Payer: Medicare HMO | Admitting: *Deleted

## 2023-05-14 ENCOUNTER — Ambulatory Visit (HOSPITAL_COMMUNITY): Payer: Medicare HMO | Admitting: Psychiatry

## 2023-05-14 DIAGNOSIS — J4550 Severe persistent asthma, uncomplicated: Secondary | ICD-10-CM | POA: Diagnosis not present

## 2023-05-14 DIAGNOSIS — R6889 Other general symptoms and signs: Secondary | ICD-10-CM | POA: Diagnosis not present

## 2023-05-16 ENCOUNTER — Ambulatory Visit: Payer: Medicare HMO

## 2023-05-16 DIAGNOSIS — R6889 Other general symptoms and signs: Secondary | ICD-10-CM | POA: Diagnosis not present

## 2023-05-18 ENCOUNTER — Encounter: Payer: Self-pay | Admitting: Allergy & Immunology

## 2023-05-20 DIAGNOSIS — J9610 Chronic respiratory failure, unspecified whether with hypoxia or hypercapnia: Secondary | ICD-10-CM | POA: Diagnosis not present

## 2023-05-20 DIAGNOSIS — J449 Chronic obstructive pulmonary disease, unspecified: Secondary | ICD-10-CM | POA: Diagnosis not present

## 2023-05-21 ENCOUNTER — Ambulatory Visit: Payer: Medicare HMO

## 2023-05-22 ENCOUNTER — Ambulatory Visit: Payer: Medicare HMO | Admitting: Internal Medicine

## 2023-05-23 ENCOUNTER — Ambulatory Visit: Payer: Medicare HMO

## 2023-05-27 ENCOUNTER — Telehealth: Payer: Self-pay | Admitting: *Deleted

## 2023-05-27 NOTE — Progress Notes (Unsigned)
  Care Coordination Note  05/27/2023 Name: [name] MRN: [id number] DOB: Oct 04, 1961  [name] is a 62 y.o. year old female who is a primary care patient of Corwin Levins, MD and is actively engaged with the care management team. I reached out to [name] by phone today to assist with re-scheduling a follow up visit with the RN Case Manager  Follow up plan: Unsuccessful telephone outreach attempt made. A HIPAA compliant phone message was left for the patient providing contact information and requesting a return call.   Burman Nieves, CCMA Care Coordination Care Guide Direct Dial: [phone]

## 2023-05-28 ENCOUNTER — Ambulatory Visit: Payer: Medicare HMO

## 2023-05-28 ENCOUNTER — Ambulatory Visit (INDEPENDENT_AMBULATORY_CARE_PROVIDER_SITE_OTHER): Payer: Medicare HMO

## 2023-05-28 DIAGNOSIS — J4550 Severe persistent asthma, uncomplicated: Secondary | ICD-10-CM

## 2023-05-28 NOTE — Progress Notes (Signed)
  Care Coordination Note  05/28/2023 Name: [name] MRN: [id number] DOB: [birth year]  [name] is a 62 y.o. year old female who is a primary care patient of Corwin Levins, MD and is actively engaged with the care management team. I reached out to [name] by phone today to assist with re-scheduling a follow up visit with the RN Case Manager  Follow up plan: Telephone appointment with care management team member scheduled for: 06/06/2023  Burman Nieves, [facility] Care Coordination Care Guide Direct Dial: [phone]

## 2023-05-31 DIAGNOSIS — R32 Unspecified urinary incontinence: Secondary | ICD-10-CM | POA: Diagnosis not present

## 2023-05-31 DIAGNOSIS — J449 Chronic obstructive pulmonary disease, unspecified: Secondary | ICD-10-CM | POA: Diagnosis not present

## 2023-05-31 DIAGNOSIS — F6381 Intermittent explosive disorder: Secondary | ICD-10-CM | POA: Diagnosis not present

## 2023-06-01 ENCOUNTER — Other Ambulatory Visit: Payer: Self-pay | Admitting: Emergency Medicine

## 2023-06-03 ENCOUNTER — Ambulatory Visit (INDEPENDENT_AMBULATORY_CARE_PROVIDER_SITE_OTHER): Payer: 59 | Admitting: Gastroenterology

## 2023-06-03 ENCOUNTER — Encounter: Payer: Self-pay | Admitting: Gastroenterology

## 2023-06-03 VITALS — BP 132/86 | HR 104 | Ht 62.0 in | Wt 193.5 lb

## 2023-06-03 DIAGNOSIS — R933 Abnormal findings on diagnostic imaging of other parts of digestive tract: Secondary | ICD-10-CM

## 2023-06-03 NOTE — Patient Instructions (Signed)
_______________________________________________________  If your blood pressure at your visit was 140/90 or greater, please contact your primary care physician to follow up on this.  _______________________________________________________  If you are age 60 or older, your body mass index should be between 23-30. Your Body mass index is 35.39 kg/m. If this is out of the aforementioned range listed, please consider follow up with your Primary Care Provider.  If you are age 49 or younger, your body mass index should be between 19-25. Your Body mass index is 35.39 kg/m. If this is out of the aformentioned range listed, please consider follow up with your Primary Care Provider.   ________________________________________________________  The [place] [facility] [facility] providers would like to encourage you to use [facility] to communicate with providers for non-urgent requests or questions.  Due to long hold times on the telephone, sending your provider a message by [facility] may be a faster and more efficient way to get a response.  Please allow 48 business hours for a response.  Please remember that this is for non-urgent requests.  _______________________________________________________  It was a pleasure to see you today!  Thank you for trusting me with your gastrointestinal care!

## 2023-06-03 NOTE — Progress Notes (Deleted)
[facility] Progress Note  Chief Complaint: ***  Subjective  History: Summary of GI issues: August 2022 evaluation by Dr. Myrtie Neither for abdominal pain, weight loss, iron deficiency anemia and previous history of H. pylori treated at [facility]. Multiple linear gastric ulcers discovered, H. pylori still positive, treated with Talicia and subsequent urea breath test confirmed eradication.  Anemia resolved at post procedure follow-up and with patient off BC powder.  Seen by our APP 3 months ago for question of cirrhosis when a inpatient CT scan incidentally reported slightly nodular liver contour.  No exam, lab or other radiographic findings to suggest portal hypertension, so cirrhosis diagnosis was questionable. Extensive metabolic and autoimmune liver-related lab workup unrevealing.  Ultrasound with elastography ordered __________________________  ***  ROS: Cardiovascular:  no chest pain Respiratory: no dyspnea  The patient's Past Medical, Family and Social History were reviewed and are on file in the EMR.  Objective:  Med list reviewed  Current Outpatient Medications:    albuterol (VENTOLIN HFA) 108 (90 Base) MCG/ACT inhaler, INHALE 2 PUFFS INTO THE LUNGS EVERY 6 HOURS AS NEEDED FOR WHEEZING OR SHORTNESS OF BREATH, Disp: 18 g, Rfl: 1   atorvastatin (LIPITOR) 40 MG tablet, Take 1 tablet (40 mg total) by mouth daily. TAKE 1 TABLET(40 MG) BY MOUTH DAILY Strength: 40 mg (Patient taking differently: Take 40 mg by mouth daily.), Disp: 90 tablet, Rfl: 3   azelastine (OPTIVAR) 0.05 % ophthalmic solution, Place 1 drop into both eyes 2 (two) times daily., Disp: 6 mL, Rfl: 12   Budeson-Glycopyrrol-Formoterol (BREZTRI AEROSPHERE) 160-9-4.8 MCG/ACT AERO, INHALE 2 PUFFS INTO THE LUNGS IN THE MORNING AND AT BEDTIME, Disp: 10.7 g, Rfl: 11   carbamazepine (TEGRETOL) 200 MG tablet, Take 2-3 tablets (400-600 mg total) by mouth See admin instructions. Take 400mg  in the morning and 600mg  at bedtime.,  Disp: 150 tablet, Rfl: 2   cetirizine (ZYRTEC) 10 MG tablet, Can take one tablet by mouth once daily if needed for runny nose., Disp: 30 tablet, Rfl: 5   Cholecalciferol (VITAMIN D) 125 MCG (5000 UT) CAPS, Take 5,000 Units by mouth daily., Disp: , Rfl:    famotidine (PEPCID) 40 MG tablet, Take 1 tablet (40 mg total) by mouth at bedtime., Disp: 30 tablet, Rfl: 3   ferrous sulfate 325 (65 FE) MG EC tablet, Take 1 tablet (325 mg total) by mouth daily with breakfast., Disp: 90 tablet, Rfl: 1   fluticasone (FLONASE) 50 MCG/ACT nasal spray, Place 1 spray into both nostrils 2 (two) times daily as needed for allergies or rhinitis., Disp: 16 g, Rfl: 5   gabapentin (NEURONTIN) 100 MG capsule, Take 1 capsule (100 mg total) by mouth 3 (three) times daily., Disp: 90 capsule, Rfl: 3   hydrochlorothiazide (MICROZIDE) 12.5 MG capsule, Take 1 capsule (12.5 mg total) by mouth daily. TAKE 1 CAPSULE(12.5 MG) BY MOUTH DAILY Strength: 12.5 mg (Patient taking differently: Take 12.5 mg by mouth daily.), Disp: 90 capsule, Rfl: 3   ipratropium (ATROVENT HFA) 17 MCG/ACT inhaler, 2 puffs every 4-6 hours as needed., Disp: 12.9 g, Rfl: 5   ipratropium-albuterol (DUONEB) 0.5-2.5 (3) MG/3ML SOLN, Take 3 mLs by nebulization 2 (two) times daily., Disp: 360 mL, Rfl: 3   irbesartan (AVAPRO) 75 MG tablet, TAKE 1 TABLET(75 MG) BY MOUTH DAILY (Patient taking differently: Take 75 mg by mouth daily.), Disp: 90 tablet, Rfl: 1   levocetirizine (XYZAL) 5 MG tablet, Take 5 mg by mouth daily., Disp: , Rfl:    montelukast (SINGULAIR)  10 MG tablet, Take 1 tablet (10 mg total) by mouth at bedtime., Disp: 90 tablet, Rfl: 1   pantoprazole (PROTONIX) 40 MG tablet, Take 1 tablet (40 mg total) by mouth 2 (two) times daily., Disp: 60 tablet, Rfl: 3   Skin Protectants, Misc. (EUCERIN) cream, Apply 1 Application topically daily as needed for dry skin., Disp: , Rfl:    tacrolimus (PROTOPIC) 0.03 % ointment, Apply 1 Application topically 2 (two) times daily  as needed (rash)., Disp: 60 g, Rfl: 5   triamcinolone ointment (KENALOG) 0.1 %, Apply 1 Application topically at bedtime., Disp: 30 g, Rfl: 5  Current Facility-Administered Medications:    dupilumab (DUPIXENT) prefilled syringe 300 mg, 300 mg, Subcutaneous, Q14 Days, Alfonse Spruce, MD, 300 mg at 05/28/23 1418   Vital signs in last 24 hrs: There were no vitals filed for this visit. Wt Readings from Last 3 Encounters:  05/06/23 192 lb 9.6 oz (87.4 kg)  03/14/23 199 lb 12.8 oz (90.6 kg)  03/07/23 195 lb (88.5 kg)    Physical Exam  *** HEENT: sclera anicteric, oral mucosa moist without lesions Neck: supple, no thyromegaly, JVD or lymphadenopathy Cardiac: ***,  no peripheral edema Pulm: clear to auscultation bilaterally, normal RR and effort noted Abdomen: soft, *** tenderness, with active bowel sounds. No guarding or palpable hepatosplenomegaly. Skin; warm and dry, no jaundice or rash  Labs:     Latest Ref Rng & Units 02/22/2023    5:25 AM 02/22/2023    1:16 AM 02/21/2023    9:26 PM  CBC  WBC 4.0 - 10.5 K/uL 6.2  10.7    Hemoglobin 12.0 - 15.0 g/dL 40.9  81.1  9.9   Hematocrit 36.0 - 46.0 % 38.6  31.2  29.0   Platelets 150 - 400 K/uL 327  310        Latest Ref Rng & Units 02/27/2023    2:20 AM 02/24/2023   12:12 AM 02/22/2023    5:25 AM  CMP  Glucose 70 - 99 mg/dL 914   782   BUN 6 - 20 mg/dL 16   14   Creatinine 9.56 - 1.00 mg/dL 2.13   0.86   Sodium 578 - 145 mmol/L 136   139   Potassium 3.5 - 5.1 mmol/L 3.8   3.9   Chloride 98 - 111 mmol/L 100   107   CO2 22 - 32 mmol/L 25   22   Calcium 8.9 - 10.3 mg/dL 8.8   9.3   Total Protein 6.5 - 8.1 g/dL  6.3    Total Bilirubin 0.3 - 1.2 mg/dL  0.4    Alkaline Phos 38 - 126 U/L  70    AST 15 - 41 U/L  22    ALT 0 - 44 U/L  22       ___________________________________________ Radiologic studies: CLINICAL DATA:  Abnormal CT demonstrating a cirrhotic appearing liver   EXAM: ULTRASOUND ABDOMEN   ULTRASOUND HEPATIC  ELASTOGRAPHY   TECHNIQUE: Sonography of the upper abdomen was performed. In addition, ultrasound elastography evaluation of the liver was performed. A region of interest was placed within the right lobe of the liver. Following application of a compressive sonographic pulse, tissue compressibility was assessed. Multiple assessments were performed at the selected site. Median tissue compressibility was determined. Previously, hepatic stiffness was assessed by shear wave velocity. Based on recently published Society of Radiologists in Ultrasound consensus article, reporting is now recommended to be performed in the SI units of pressure (  kiloPascals) representing hepatic stiffness/elasticity. The obtained result is compared to the published reference standards. (cACLD = compensated Advanced Chronic Liver Disease)   COMPARISON:  CT chest 02/22/2023   FINDINGS: ULTRASOUND ABDOMEN   Gallbladder: Normally distended without stones or wall thickening. No pericholecystic fluid or sonographic Murphy sign.   Common bile duct: Diameter: Inadequately visualized due to poor sound penetration   Liver: Increased hepatic echogenicity with slightly nodular hepatic margins suggesting cirrhosis. No discrete hepatic mass. No intrahepatic biliary dilatation. Portal vein is patent on color Doppler imaging with normal direction of blood flow towards the liver.   IVC: Normal appearance   Pancreas: Normal appearance   Spleen: Normal appearance, 9.3 cm length   Right Kidney: Length: 10.8 cm. Normal morphology without mass or hydronephrosis.   Left Kidney: Length: 10.1 cm. Normal morphology without mass or hydronephrosis.   Abdominal aorta: Normal caliber   Other findings: No free fluid   ULTRASOUND HEPATIC ELASTOGRAPHY   Device: Siemens Helix VTQ   Patient position: Oblique   Transducer 9C2   Number of measurements: 12   Hepatic segment:  8   Median kPa: 6.7   IQR: 2.8    IQR/Median kPa ratio: 0.4   Data quality:  Good   Diagnostic category: < or = 9 kPa: in the absence of other known clinical signs, rules out cACLD   The use of hepatic elastography is applicable to patients with viral hepatitis and non-alcoholic fatty liver disease. At this time, there is insufficient data for the referenced cut-off values and use in other causes of liver disease, including alcoholic liver disease. Patients, however, may be assessed by elastography and serve as their own reference standard/baseline.   In patients with non-alcoholic liver disease, the values suggesting compensated advanced chronic liver disease (cACLD) may be lower, and patients may need additional testing with elasticity results of 7-9 kPa.   Please note that abnormal hepatic elasticity and shear wave velocities may also be identified in clinical settings other than with hepatic fibrosis, such as: acute hepatitis, elevated right heart and central venous pressures including use of beta blockers, veno-occlusive disease (Budd-Chiari), infiltrative processes such as mastocytosis/amyloidosis/infiltrative tumor/lymphoma, extrahepatic cholestasis, with hyperemia in the post-prandial state, and with liver transplantation. Correlation with patient history, laboratory data, and clinical condition recommended.   Diagnostic Categories:   < or =5 kPa: high probability of being normal   < or =9 kPa: in the absence of other known clinical signs, rules out cACLD   >9 kPa and ?13 kPa: suggestive of cACLD, but needs further testing   >13 kPa: highly suggestive of cACLD   > or =17 kPa: highly suggestive of cACLD with an increased probability of clinically significant portal hypertension   IMPRESSION: ULTRASOUND ABDOMEN:   Increased hepatic echogenicity with slightly nodular hepatic margins suggesting cirrhosis.   Remaining ultrasound abdomen unremarkable.   ULTRASOUND HEPATIC ELASTOGRAPHY:    Median kPa:  6.7   Diagnostic category: < or = 9 kPa: in the absence of other known clinical signs, rules out cACLD. However in light of morphologic changes demonstrated sonographically and by CT, consider further testing.     Electronically Signed   By: Ulyses Southward M.D.   On: 03/21/2023 10:12        ____________________________________________ Other:   _____________________________________________ Assessment & Plan  Assessment: No diagnosis found.    Plan:   *** minutes were spent on this encounter (including chart review, history/exam, counseling/coordination of care, and documentation) > 50% of that time was  spent on counseling and coordination of care.   Nelida Meuse III

## 2023-06-03 NOTE — Progress Notes (Signed)
[facility] Progress Note  Chief Complaint:  Chief Complaint  Patient presents with   Cirrhosis    Abnormal [place]    Subjective  History: Summary of GI issues: August 2022 evaluation by Dr. Myrtie Neither for abdominal pain, weight loss, iron deficiency anemia and previous history of H. pylori treated at [facility]. Multiple linear gastric ulcers discovered, H. pylori still positive, treated with Talicia and subsequent urea breath test confirmed eradication.  Anemia resolved at post procedure follow-up and with patient off BC powder.  Seen by our APP 3 months ago for question of cirrhosis when a inpatient CT scan incidentally reported slightly nodular liver contour.  No exam, lab or other radiographic findings to suggest portal hypertension, so cirrhosis diagnosis was questionable. Extensive metabolic and autoimmune liver-related lab workup unrevealing.  Ultrasound with elastography ordered __________________________  Today, she reports feeling well overall with no GI complains.   We reviewed her last visit with PA Doug Sou and discussed her blood work and imaging results.   Genivieve denies diarrhea, constipation, nausea, blood in stool, black stool, vomiting, abdominal pain, bloating, unintentional weight loss, reflux, dysphagia.  ROS: Review of Systems  Constitutional:  Negative for appetite change and fever.  HENT:  Negative for trouble swallowing.   Respiratory:  Negative for cough and shortness of breath.   Cardiovascular:  Negative for chest pain.  Gastrointestinal:  Negative for abdominal distention, abdominal pain, anal bleeding, blood in stool, constipation, diarrhea, nausea, rectal pain and vomiting.  Genitourinary:  Negative for dysuria.  Musculoskeletal:  Negative for back pain.  Skin:  Negative for rash.  Neurological:  Negative for weakness.  All other systems reviewed and are negative.   The patient's Past Medical, Family and Social History were reviewed and are  on file in the EMR. She is unaware of any family history of liver disease Objective:  Med list reviewed  Current Outpatient Medications:    albuterol (VENTOLIN HFA) 108 (90 Base) MCG/ACT inhaler, INHALE 2 PUFFS INTO THE LUNGS EVERY 6 HOURS AS NEEDED FOR WHEEZING OR SHORTNESS OF BREATH, Disp: 18 g, Rfl: 1   ARIPiprazole (ABILIFY) 10 MG tablet, Take 10 mg by mouth daily., Disp: , Rfl:    atorvastatin (LIPITOR) 40 MG tablet, Take 1 tablet (40 mg total) by mouth daily. TAKE 1 TABLET(40 MG) BY MOUTH DAILY Strength: 40 mg (Patient taking differently: Take 40 mg by mouth daily.), Disp: 90 tablet, Rfl: 3   azelastine (OPTIVAR) 0.05 % ophthalmic solution, Place 1 drop into both eyes 2 (two) times daily., Disp: 6 mL, Rfl: 12   Budeson-Glycopyrrol-Formoterol (BREZTRI AEROSPHERE) 160-9-4.8 MCG/ACT AERO, INHALE 2 PUFFS INTO THE LUNGS IN THE MORNING AND AT BEDTIME, Disp: 10.7 g, Rfl: 11   budesonide (PULMICORT) 0.5 MG/2ML nebulizer solution, Take 0.5 mg by nebulization daily., Disp: , Rfl:    carbamazepine (TEGRETOL) 200 MG tablet, Take 2-3 tablets (400-600 mg total) by mouth See admin instructions. Take 400mg  in the morning and 600mg  at bedtime., Disp: 150 tablet, Rfl: 2   cetirizine (ZYRTEC) 10 MG tablet, Can take one tablet by mouth once daily if needed for runny nose., Disp: 30 tablet, Rfl: 5   Cholecalciferol (VITAMIN D) 125 MCG (5000 UT) CAPS, Take 5,000 Units by mouth daily., Disp: , Rfl:    cyclobenzaprine (FLEXERIL) 5 MG tablet, Take 5 mg by mouth 3 (three) times daily as needed., Disp: , Rfl:    DUPIXENT 300 MG/2ML prefilled syringe, Inject 300 mg into the skin every 14 (fourteen)  days., Disp: , Rfl:    famotidine (PEPCID) 40 MG tablet, Take 1 tablet (40 mg total) by mouth at bedtime., Disp: 30 tablet, Rfl: 3   ferrous sulfate 325 (65 FE) MG EC tablet, Take 1 tablet (325 mg total) by mouth daily with breakfast., Disp: 90 tablet, Rfl: 1   fluticasone (FLONASE) 50 MCG/ACT nasal spray, Place 1 spray  into both nostrils 2 (two) times daily as needed for allergies or rhinitis., Disp: 16 g, Rfl: 5   gabapentin (NEURONTIN) 100 MG capsule, Take 1 capsule (100 mg total) by mouth 3 (three) times daily., Disp: 90 capsule, Rfl: 3   hydrochlorothiazide (MICROZIDE) 12.5 MG capsule, Take 1 capsule (12.5 mg total) by mouth daily. TAKE 1 CAPSULE(12.5 MG) BY MOUTH DAILY Strength: 12.5 mg (Patient taking differently: Take 12.5 mg by mouth daily.), Disp: 90 capsule, Rfl: 3   hydrOXYzine (VISTARIL) 25 MG capsule, Take 25 mg by mouth 2 (two) times daily as needed., Disp: , Rfl:    ipratropium (ATROVENT HFA) 17 MCG/ACT inhaler, 2 puffs every 4-6 hours as needed., Disp: 12.9 g, Rfl: 5   ipratropium-albuterol (DUONEB) 0.5-2.5 (3) MG/3ML SOLN, Take 3 mLs by nebulization 2 (two) times daily., Disp: 360 mL, Rfl: 3   irbesartan (AVAPRO) 75 MG tablet, TAKE 1 TABLET(75 MG) BY MOUTH DAILY (Patient taking differently: Take 75 mg by mouth daily.), Disp: 90 tablet, Rfl: 1   levocetirizine (XYZAL) 5 MG tablet, Take 5 mg by mouth daily., Disp: , Rfl:    montelukast (SINGULAIR) 10 MG tablet, Take 1 tablet (10 mg total) by mouth at bedtime., Disp: 90 tablet, Rfl: 1   pantoprazole (PROTONIX) 40 MG tablet, Take 1 tablet (40 mg total) by mouth 2 (two) times daily., Disp: 60 tablet, Rfl: 3   Skin Protectants, Misc. (EUCERIN) cream, Apply 1 Application topically daily as needed for dry skin., Disp: , Rfl:    tacrolimus (PROTOPIC) 0.03 % ointment, Apply 1 Application topically 2 (two) times daily as needed (rash)., Disp: 60 g, Rfl: 5   triamcinolone ointment (KENALOG) 0.1 %, Apply 1 Application topically at bedtime., Disp: 30 g, Rfl: 5  Current Facility-Administered Medications:    dupilumab (DUPIXENT) prefilled syringe 300 mg, 300 mg, Subcutaneous, Q14 Days, Alfonse Spruce, MD, 300 mg at 05/28/23 1418   Vital signs in last 24 hrs: Vitals:   06/03/23 1417  BP: (!) 154/88  Pulse: (!) 104   Wt Readings from Last 3  Encounters:  06/03/23 193 lb 8 oz (87.8 kg)  05/06/23 192 lb 9.6 oz (87.4 kg)  03/14/23 199 lb 12.8 oz (90.6 kg)    Physical Exam  General: well-appearing   Eyes: sclera anicteric, no redness ENT: oral mucosa moist without lesions, no cervical or supraclavicular lymphadenopathy CV: RRR, no JVD, no peripheral edema Resp: clear to auscultation bilaterally, normal RR and effort noted GI: soft, no tenderness, with active bowel sounds. No guarding or palpable organomegaly noted. Skin; warm and dry, no rash or jaundice noted Neuro: awake, alert and oriented x 3. Normal gross motor function and fluent speech  Labs:     Latest Ref Rng & Units 02/22/2023    5:25 AM 02/22/2023    1:16 AM 02/21/2023    9:26 PM  CBC  WBC 4.0 - 10.5 K/uL 6.2  10.7    Hemoglobin 12.0 - 15.0 g/dL 16.1  09.6  9.9   Hematocrit 36.0 - 46.0 % 38.6  31.2  29.0   Platelets 150 - 400 K/uL 327  310  Latest Ref Rng & Units 02/27/2023    2:20 AM 02/24/2023   12:12 AM 02/22/2023    5:25 AM  CMP  Glucose 70 - 99 mg/dL 098   119   BUN 6 - 20 mg/dL 16   14   Creatinine 1.47 - 1.00 mg/dL 8.29   5.62   Sodium 130 - 145 mmol/L 136   139   Potassium 3.5 - 5.1 mmol/L 3.8   3.9   Chloride 98 - 111 mmol/L 100   107   CO2 22 - 32 mmol/L 25   22   Calcium 8.9 - 10.3 mg/dL 8.8   9.3   Total Protein 6.5 - 8.1 g/dL  6.3    Total Bilirubin 0.3 - 1.2 mg/dL  0.4    Alkaline Phos 38 - 126 U/L  70    AST 15 - 41 U/L  22    ALT 0 - 44 U/L  22       ___________________________________________ Radiologic studies: CLINICAL DATA:  Abnormal CT demonstrating a cirrhotic appearing liver   EXAM: ULTRASOUND ABDOMEN   ULTRASOUND HEPATIC ELASTOGRAPHY   TECHNIQUE: Sonography of the upper abdomen was performed. In addition, ultrasound elastography evaluation of the liver was performed. A region of interest was placed within the right lobe of the liver. Following application of a compressive sonographic pulse,  tissue compressibility was assessed. Multiple assessments were performed at the selected site. Median tissue compressibility was determined. Previously, hepatic stiffness was assessed by shear wave velocity. Based on recently published Society of Radiologists in Ultrasound consensus article, reporting is now recommended to be performed in the SI units of pressure (kiloPascals) representing hepatic stiffness/elasticity. The obtained result is compared to the published reference standards. (cACLD = compensated Advanced Chronic Liver Disease)   COMPARISON:  CT chest 02/22/2023   FINDINGS: ULTRASOUND ABDOMEN   Gallbladder: Normally distended without stones or wall thickening. No pericholecystic fluid or sonographic Murphy sign.   Common bile duct: Diameter: Inadequately visualized due to poor sound penetration   Liver: Increased hepatic echogenicity with slightly nodular hepatic margins suggesting cirrhosis. No discrete hepatic mass. No intrahepatic biliary dilatation. Portal vein is patent on color Doppler imaging with normal direction of blood flow towards the liver.   IVC: Normal appearance   Pancreas: Normal appearance   Spleen: Normal appearance, 9.3 cm length   Right Kidney: Length: 10.8 cm. Normal morphology without mass or hydronephrosis.   Left Kidney: Length: 10.1 cm. Normal morphology without mass or hydronephrosis.   Abdominal aorta: Normal caliber   Other findings: No free fluid   ULTRASOUND HEPATIC ELASTOGRAPHY   Device: Siemens Helix VTQ   Patient position: Oblique   Transducer 9C2   Number of measurements: 12   Hepatic segment:  8   Median kPa: 6.7   IQR: 2.8   IQR/Median kPa ratio: 0.4   Data quality:  Good   Diagnostic category: < or = 9 kPa: in the absence of other known clinical signs, rules out cACLD   The use of hepatic elastography is applicable to patients with viral hepatitis and non-alcoholic fatty liver disease. At this time,  there is insufficient data for the referenced cut-off values and use in other causes of liver disease, including alcoholic liver disease. Patients, however, may be assessed by elastography and serve as their own reference standard/baseline.   In patients with non-alcoholic liver disease, the values suggesting compensated advanced chronic liver disease (cACLD) may be lower, and patients may need additional testing with elasticity  results of 7-9 kPa.   Please note that abnormal hepatic elasticity and shear wave velocities may also be identified in clinical settings other than with hepatic fibrosis, such as: acute hepatitis, elevated right heart and central venous pressures including use of beta blockers, veno-occlusive disease (Budd-Chiari), infiltrative processes such as mastocytosis/amyloidosis/infiltrative tumor/lymphoma, extrahepatic cholestasis, with hyperemia in the post-prandial state, and with liver transplantation. Correlation with patient history, laboratory data, and clinical condition recommended.   Diagnostic Categories:   < or =5 kPa: high probability of being normal   < or =9 kPa: in the absence of other known clinical signs, rules out cACLD   >9 kPa and ?13 kPa: suggestive of cACLD, but needs further testing   >13 kPa: highly suggestive of cACLD   > or =17 kPa: highly suggestive of cACLD with an increased probability of clinically significant portal hypertension   IMPRESSION: ULTRASOUND ABDOMEN:   Increased hepatic echogenicity with slightly nodular hepatic margins suggesting cirrhosis.   Remaining ultrasound abdomen unremarkable.   ULTRASOUND HEPATIC ELASTOGRAPHY:   Median kPa:  6.7   Diagnostic category: < or = 9 kPa: in the absence of other known clinical signs, rules out cACLD. However in light of morphologic changes demonstrated sonographically and by CT, consider further testing.     Electronically Signed   By: Ulyses Southward M.D.   On:  03/21/2023 10:12        ____________________________________________ Other:   _____________________________________________ Assessment & Plan  Assessment: Abnormal finding on GI tract imaging  Based on her overall clinical picture, I do not think that Lela has cirrhosis.  Imaging suggest altered hepatic echotexture which might indicate some fatty liver, but fortunately her LFTs remain normal.  She was advised to decrease intake of carbohydrates (sugars and starches) in the event that there is some fatty liver.  I do not feel that a liver biopsy or other liver specific testing needed at this point.   Plan: -Advised to be on a low carb diet and increase fruit and vegetable intake   Return as needed    - Amada Jupiter, MD    [facility]  Ladona Mow M Kadhim,acting as a scribe for Charlie Pitter III, MD.,have documented all relevant documentation on the behalf of Sherrilyn Rist, MD,as directed by  Sherrilyn Rist, MD while in the presence of Sherrilyn Rist, MD.   Marvis Repress III, MD, have reviewed all documentation for this visit. The documentation on 06/03/23 for the exam, diagnosis, procedures, and orders are all accurate and complete.

## 2023-06-04 ENCOUNTER — Ambulatory Visit: Payer: Medicare HMO

## 2023-06-04 ENCOUNTER — Telehealth: Payer: Self-pay | Admitting: Internal Medicine

## 2023-06-04 DIAGNOSIS — F310 Bipolar disorder, current episode hypomanic: Secondary | ICD-10-CM | POA: Diagnosis not present

## 2023-06-04 DIAGNOSIS — F6381 Intermittent explosive disorder: Secondary | ICD-10-CM | POA: Diagnosis not present

## 2023-06-04 DIAGNOSIS — F319 Bipolar disorder, unspecified: Secondary | ICD-10-CM

## 2023-06-04 NOTE — Telephone Encounter (Signed)
Ok done

## 2023-06-04 NOTE — Telephone Encounter (Signed)
[facility] called to request Dr. Jonny Ruiz send them a referral to handle pt's mental health issues and medications.   Phone: [phone] Fax: [phone]

## 2023-06-05 ENCOUNTER — Encounter (HOSPITAL_COMMUNITY): Payer: Self-pay

## 2023-06-06 ENCOUNTER — Telehealth: Payer: Self-pay

## 2023-06-06 NOTE — Patient Outreach (Signed)
  Care Coordination   06/06/2023 Name: [name] MRN: [id number] DOB: 07-05-1961   Care Coordination Outreach Attempts:  An unsuccessful telephone outreach was attempted for a scheduled appointment today.  Follow Up Plan:  Additional outreach attempts will be made to offer the patient care coordination information and services.   Encounter Outcome:  No Answer   Care Coordination Interventions:  No, not indicated    Kathyrn Sheriff, RN, MSN, BSN, CCM [facility] Care Coordinator [phone]

## 2023-06-07 ENCOUNTER — Encounter (HOSPITAL_COMMUNITY): Payer: Self-pay

## 2023-06-07 ENCOUNTER — Telehealth (HOSPITAL_COMMUNITY): Payer: Self-pay

## 2023-06-07 NOTE — Telephone Encounter (Signed)
Pt returned phone call and is interested in the pulmonary rehab program. Pt will come in for orientation on 7/1@1  and will attend the 1:15 exercise class.   Sent packet.

## 2023-06-07 NOTE — Telephone Encounter (Signed)
Pt insurance is active and benefits verified through [facility] Co-pay 0, DED 0/0 met, out of pocket $8,850/$6,418.47 met, co-insurance 0%. no pre-authorization required, [employer] 06/07/23@2 :48, REF# [id number]   No limit

## 2023-06-10 DIAGNOSIS — F6381 Intermittent explosive disorder: Secondary | ICD-10-CM | POA: Diagnosis not present

## 2023-06-11 ENCOUNTER — Ambulatory Visit (INDEPENDENT_AMBULATORY_CARE_PROVIDER_SITE_OTHER): Payer: Medicare HMO | Admitting: *Deleted

## 2023-06-11 DIAGNOSIS — J4550 Severe persistent asthma, uncomplicated: Secondary | ICD-10-CM | POA: Diagnosis not present

## 2023-06-12 ENCOUNTER — Telehealth (HOSPITAL_COMMUNITY): Payer: Self-pay

## 2023-06-12 NOTE — Telephone Encounter (Signed)
Called Caela to see if she was still doing physical therapy. Left a message with call back number.

## 2023-06-13 ENCOUNTER — Other Ambulatory Visit: Payer: Self-pay

## 2023-06-13 ENCOUNTER — Emergency Department (HOSPITAL_COMMUNITY)
Admission: EM | Admit: 2023-06-13 | Discharge: 2023-06-13 | Disposition: A | Discharge status: Home / Self Care | Payer: Medicare HMO | Attending: Emergency Medicine | Admitting: Emergency Medicine

## 2023-06-13 ENCOUNTER — Emergency Department (HOSPITAL_COMMUNITY): Payer: Medicare HMO

## 2023-06-13 ENCOUNTER — Encounter (HOSPITAL_COMMUNITY): Payer: Self-pay

## 2023-06-13 DIAGNOSIS — J383 Other diseases of vocal cords: Secondary | ICD-10-CM | POA: Insufficient documentation

## 2023-06-13 DIAGNOSIS — Z1152 Encounter for screening for COVID-19: Secondary | ICD-10-CM | POA: Diagnosis not present

## 2023-06-13 DIAGNOSIS — R062 Wheezing: Secondary | ICD-10-CM | POA: Diagnosis not present

## 2023-06-13 DIAGNOSIS — J449 Chronic obstructive pulmonary disease, unspecified: Secondary | ICD-10-CM | POA: Diagnosis not present

## 2023-06-13 DIAGNOSIS — I771 Stricture of artery: Secondary | ICD-10-CM | POA: Diagnosis not present

## 2023-06-13 DIAGNOSIS — Z7951 Long term (current) use of inhaled steroids: Secondary | ICD-10-CM | POA: Diagnosis not present

## 2023-06-13 DIAGNOSIS — I700 Atherosclerosis of aorta: Secondary | ICD-10-CM | POA: Diagnosis not present

## 2023-06-13 DIAGNOSIS — R000 Tachycardia, unspecified: Secondary | ICD-10-CM | POA: Insufficient documentation

## 2023-06-13 DIAGNOSIS — R0600 Dyspnea, unspecified: Secondary | ICD-10-CM | POA: Insufficient documentation

## 2023-06-13 DIAGNOSIS — I10 Essential (primary) hypertension: Secondary | ICD-10-CM | POA: Diagnosis not present

## 2023-06-13 DIAGNOSIS — R0603 Acute respiratory distress: Secondary | ICD-10-CM | POA: Diagnosis not present

## 2023-06-13 DIAGNOSIS — R609 Edema, unspecified: Secondary | ICD-10-CM | POA: Diagnosis not present

## 2023-06-13 DIAGNOSIS — R069 Unspecified abnormalities of breathing: Secondary | ICD-10-CM | POA: Diagnosis not present

## 2023-06-13 DIAGNOSIS — R0602 Shortness of breath: Secondary | ICD-10-CM | POA: Diagnosis not present

## 2023-06-13 LAB — COMPREHENSIVE METABOLIC PANEL
ALT: 21 U/L (ref 0–44)
AST: 26 U/L (ref 15–41)
Albumin: 3.6 g/dL (ref 3.5–5.0)
Alkaline Phosphatase: 78 U/L (ref 38–126)
Anion gap: 10 (ref 5–15)
BUN: 14 mg/dL (ref 6–20)
CO2: 26 mmol/L (ref 22–32)
Calcium: 8.4 mg/dL — ABNORMAL LOW (ref 8.9–10.3)
Chloride: 105 mmol/L (ref 98–111)
Creatinine, Ser: 0.77 mg/dL (ref 0.44–1.00)
GFR, Estimated: >60 mL/min (ref >60)
Glucose, Bld: 84 mg/dL (ref 70–99)
Potassium: 4 mmol/L (ref 3.5–5.1)
Sodium: 141 mmol/L (ref 135–145)
Total Bilirubin: 0.2 mg/dL — ABNORMAL LOW (ref 0.3–1.2)
Total Protein: 6.2 g/dL — ABNORMAL LOW (ref 6.5–8.1)

## 2023-06-13 LAB — CBC WITH DIFFERENTIAL/PLATELET
Abs Immature Granulocytes: 0.06 10*3/uL (ref 0.00–0.07)
Basophils Absolute: 0 10*3/uL (ref 0.0–0.1)
Basophils Relative: 0 %
Eosinophils Absolute: 0.2 10*3/uL (ref 0.0–0.5)
Eosinophils Relative: 2 %
HCT: 31.9 % — ABNORMAL LOW (ref 36.0–46.0)
Hemoglobin: 9.8 g/dL — ABNORMAL LOW (ref 12.0–15.0)
Immature Granulocytes: 1 %
Lymphocytes Relative: 28 %
Lymphs Abs: 3.3 10*3/uL (ref 0.7–4.0)
MCH: 29.7 pg (ref 26.0–34.0)
MCHC: 30.7 g/dL (ref 30.0–36.0)
MCV: 96.7 fL (ref 80.0–100.0)
Monocytes Absolute: 0.9 10*3/uL (ref 0.1–1.0)
Monocytes Relative: 8 %
Neutro Abs: 7.2 10*3/uL (ref 1.7–7.7)
Neutrophils Relative %: 61 %
Platelets: 371 10*3/uL (ref 150–400)
RBC: 3.3 MIL/uL — ABNORMAL LOW (ref 3.87–5.11)
RDW: 13.7 % (ref 11.5–15.5)
WBC: 11.7 10*3/uL — ABNORMAL HIGH (ref 4.0–10.5)
nRBC: 0 % (ref 0.0–0.2)

## 2023-06-13 LAB — SARS CORONAVIRUS 2 BY RT PCR: SARS Coronavirus 2 by RT PCR: NEGATIVE

## 2023-06-13 NOTE — ED Triage Notes (Addendum)
Pt BIB [facility] from home d/t trouble breathing past few days. Pt had been doing home Neb Tx trying to treat her s/s, Hx of asthma & COPD. EMS arrived today & she had wheezing in all fields & they had her on a continuous Ned & her spO2 was 98% the entire time. She does wear a CPAP at night, A/Ox4. 141/70, 84 bpm & reg, 22g PIV in Lt hand. Received a total of 10 mg Albuterol Neb, 1 Atrovent, 125 Solu-Medrol & 2 g Mag. Pt denies CP & audible wheezing heard when arrived to ED & Neb Tx was completed/removed.

## 2023-06-13 NOTE — ED Provider Notes (Signed)
[place] EMERGENCY DEPARTMENT AT [facility] Provider Note   CSN: [id number] Arrival date & time: 06/13/23  1057     History  Chief Complaint  Patient presents with   Resp Distress    Elianys Conry is a 62 y.o. female.  HPI Patient present presents with shortness of breath.  History of COPD.  Has been doing bad for the last few days but worse last couple days.  Has been COPD overlap history.  Has been using nebulizers.  Today felt much more short of breath.  Received 10 mg albuterol 1 mg of Ativan 125 Solu-Medrol and 2 g of mag.  Reportedly did not have pulse ox checked before    Home Medications Prior to Admission medications   Medication Sig Start Date End Date Taking? Authorizing Provider  albuterol (VENTOLIN HFA) 108 (90 Base) MCG/ACT inhaler INHALE 2 PUFFS INTO THE LUNGS EVERY 6 HOURS AS NEEDED FOR WHEEZING OR SHORTNESS OF BREATH 04/29/23   Alfonse Spruce, MD  ARIPiprazole (ABILIFY) 10 MG tablet Take 10 mg by mouth daily. 05/02/23   [provider]  atorvastatin (LIPITOR) 40 MG tablet Take 1 tablet (40 mg total) by mouth daily. TAKE 1 TABLET(40 MG) BY MOUTH DAILY Strength: 40 mg Patient taking differently: Take 40 mg by mouth daily. 05/23/22   Corwin Levins, MD  azelastine (OPTIVAR) 0.05 % ophthalmic solution Place 1 drop into both eyes 2 (two) times daily. 05/18/22   Corwin Levins, MD  Budeson-Glycopyrrol-Formoterol (BREZTRI AEROSPHERE) 160-9-4.8 MCG/ACT AERO INHALE 2 PUFFS INTO THE LUNGS IN THE MORNING AND AT BEDTIME 06/03/23   Leslye Peer, MD  budesonide (PULMICORT) 0.5 MG/2ML nebulizer solution Take 0.5 mg by nebulization daily. 05/20/23   [provider]  carbamazepine (TEGRETOL) 200 MG tablet Take 2-3 tablets (400-600 mg total) by mouth See admin instructions. Take 400mg  in the morning and 600mg  at bedtime. 02/06/23   Plovsky, Earvin Hansen, MD  cetirizine (ZYRTEC) 10 MG tablet Can take one tablet by mouth once daily if needed for runny  nose. 02/28/23   Alfonse Spruce, MD  Cholecalciferol (VITAMIN D) 125 MCG (5000 UT) CAPS Take 5,000 Units by mouth daily.    [provider]  cyclobenzaprine (FLEXERIL) 5 MG tablet Take 5 mg by mouth 3 (three) times daily as needed. 05/19/23   [provider]  DUPIXENT 300 MG/2ML prefilled syringe Inject 300 mg into the skin every 14 (fourteen) days. 05/22/23   [provider]  famotidine (PEPCID) 40 MG tablet Take 1 tablet (40 mg total) by mouth at bedtime. 01/22/23   Glenford Bayley, NP  ferrous sulfate 325 (65 FE) MG EC tablet Take 1 tablet (325 mg total) by mouth daily with breakfast. 04/08/23   Corwin Levins, MD  fluticasone [facility]) 50 MCG/ACT nasal spray Place 1 spray into both nostrils 2 (two) times daily as needed for allergies or rhinitis. 02/28/23   Alfonse Spruce, MD  gabapentin (NEURONTIN) 100 MG capsule Take 1 capsule (100 mg total) by mouth 3 (three) times daily. 12/21/22   Plovsky, Earvin Hansen, MD  hydrochlorothiazide (MICROZIDE) 12.5 MG capsule Take 1 capsule (12.5 mg total) by mouth daily. TAKE 1 CAPSULE(12.5 MG) BY MOUTH DAILY Strength: 12.5 mg Patient taking differently: Take 12.5 mg by mouth daily. 05/23/22   Corwin Levins, MD  hydrOXYzine (VISTARIL) 25 MG capsule Take 25 mg by mouth 2 (two) times daily as needed. 05/02/23   [provider]  ipratropium (ATROVENT HFA) 17 MCG/ACT  inhaler 2 puffs every 4-6 hours as needed. 02/28/23   Alfonse Spruce, MD  ipratropium-albuterol (DUONEB) 0.5-2.5 (3) MG/3ML SOLN Take 3 mLs by nebulization 2 (two) times daily. 01/15/23   Kathlen Mody, MD  irbesartan (AVAPRO) 75 MG tablet TAKE 1 TABLET(75 MG) BY MOUTH DAILY Patient taking differently: Take 75 mg by mouth daily. 11/06/21   Corwin Levins, MD  levocetirizine (XYZAL) 5 MG tablet Take 5 mg by mouth daily. 04/08/23   [provider]  montelukast (SINGULAIR) 10 MG tablet Take 1 tablet (10 mg total) by mouth at bedtime. 02/28/23   Alfonse Spruce, MD  pantoprazole (PROTONIX) 40 MG tablet Take 1 tablet (40 mg total) by mouth 2 (two) times daily. 01/22/23   Glenford Bayley, NP  Skin Protectants, Misc. (EUCERIN) cream Apply 1 Application topically daily as needed for dry skin.    [provider]  tacrolimus (PROTOPIC) 0.03 % ointment Apply 1 Application topically 2 (two) times daily as needed (rash). 02/28/23   Alfonse Spruce, MD  triamcinolone ointment (KENALOG) 0.1 % Apply 1 Application topically at bedtime. 02/28/23   Alfonse Spruce, MD      Allergies    Sulfa antibiotics and Venofer [iron sucrose]    Review of Systems   Review of Systems  Physical Exam Updated Vital Signs BP 126/78   Pulse 82   Temp 97.7 F (36.5 C)   Resp 15   SpO2 100%  Physical Exam Vitals reviewed.  Cardiovascular:     Rate and Rhythm: Tachycardia present.  Pulmonary:     Comments: Tachypnea.  Potential upper airway wheezes but no frank pulmonary wheezes. Skin:    Capillary Refill: Capillary refill takes less than 2 seconds.  Neurological:     Mental Status: She is alert.     ED Results / Procedures / Treatments   Labs (all labs ordered are listed, but only abnormal results are displayed) Labs Reviewed  COMPREHENSIVE METABOLIC PANEL - Abnormal; Notable for the following components:      Result Value   Calcium 8.4 (*)    Total Protein 6.2 (*)    Total Bilirubin 0.2 (*)    All other components within normal limits  CBC WITH DIFFERENTIAL/PLATELET - Abnormal; Notable for the following components:   WBC 11.7 (*)    RBC 3.30 (*)    Hemoglobin 9.8 (*)    HCT 31.9 (*)    All other components within normal limits  SARS CORONAVIRUS 2 BY RT PCR    EKG None  Radiology DG Chest Portable 1 View  Result Date: 06/13/2023 CLINICAL DATA:  Shortness of breath EXAM: PORTABLE CHEST 1 VIEW COMPARISON:  X-ray 02/21/2023 and older. Chest CT without contrast 02/22/2023 FINDINGS: No consolidation, pneumothorax or effusion.  Normal cardiopericardial silhouette without edema. Calcified and tortuous aorta. Overlapping cardiac leads. Degenerative changes along the spine. Fixation hardware seen along the lower cervical spine at the edge of the imaging field. IMPRESSION: No acute cardiopulmonary disease Electronically Signed   By: Karen Kays M.D.   On: 06/13/2023 12:03    Procedures Procedures    Medications Ordered in ED Medications - No data to display  ED Course/ Medical Decision Making/ A&P                             Medical Decision Making Amount and/or Complexity of Data Reviewed Labs: ordered. Radiology: ordered.  Patient is shortness of breath.  Potential history of vocal cord dysfunction.  Not hypoxic.  Sounds improved with speaking.  States her voice been off for couple days.  Had multiple medicines by EMS.  Differential diagnose includes upper airway issues, pneumonia, asthma/COPD.  Much better after being watched in the ER.  Back to baseline.  No hypoxia voice normalized.  Potentially I think more likely vocal cord dysfunction or upper airway issues as opposed to asthma/COPD.  Will discharge home with outpatient follow-up.       Final Clinical Impression(s) / ED Diagnoses Final diagnoses:  Dyspnea, unspecified type  Vocal cord dysfunction    Rx / DC Orders ED Discharge Orders     None         Benjiman Core, MD 06/13/23 1348

## 2023-06-13 NOTE — Discharge Instructions (Addendum)
Follow-up with your ear nose and throat doctor and pulmonology as needed.

## 2023-06-17 ENCOUNTER — Ambulatory Visit (HOSPITAL_COMMUNITY): Payer: Medicare HMO

## 2023-06-18 ENCOUNTER — Encounter: Payer: Self-pay | Admitting: Internal Medicine

## 2023-06-18 ENCOUNTER — Ambulatory Visit (INDEPENDENT_AMBULATORY_CARE_PROVIDER_SITE_OTHER): Payer: Medicare HMO | Admitting: Internal Medicine

## 2023-06-18 VITALS — BP 128/76 | HR 85 | Temp 98.5°F | Ht 62.0 in | Wt 193.0 lb

## 2023-06-18 DIAGNOSIS — I10 Essential (primary) hypertension: Secondary | ICD-10-CM | POA: Diagnosis not present

## 2023-06-18 DIAGNOSIS — J383 Other diseases of vocal cords: Secondary | ICD-10-CM

## 2023-06-18 DIAGNOSIS — E538 Deficiency of other specified B group vitamins: Secondary | ICD-10-CM | POA: Diagnosis not present

## 2023-06-18 DIAGNOSIS — E559 Vitamin D deficiency, unspecified: Secondary | ICD-10-CM

## 2023-06-18 DIAGNOSIS — R7303 Prediabetes: Secondary | ICD-10-CM

## 2023-06-18 DIAGNOSIS — R062 Wheezing: Secondary | ICD-10-CM

## 2023-06-18 DIAGNOSIS — R7989 Other specified abnormal findings of blood chemistry: Secondary | ICD-10-CM

## 2023-06-18 DIAGNOSIS — J441 Chronic obstructive pulmonary disease with (acute) exacerbation: Secondary | ICD-10-CM | POA: Diagnosis not present

## 2023-06-18 DIAGNOSIS — E7800 Pure hypercholesterolemia, unspecified: Secondary | ICD-10-CM | POA: Diagnosis not present

## 2023-06-18 DIAGNOSIS — R600 Localized edema: Secondary | ICD-10-CM

## 2023-06-18 DIAGNOSIS — Z0001 Encounter for general adult medical examination with abnormal findings: Secondary | ICD-10-CM | POA: Diagnosis not present

## 2023-06-18 LAB — CBC WITH DIFFERENTIAL/PLATELET
Basophils Absolute: 0 10*3/uL (ref 0.0–0.1)
Basophils Relative: 0.2 % (ref 0.0–3.0)
Eosinophils Absolute: 0.2 10*3/uL (ref 0.0–0.7)
Eosinophils Relative: 1.5 % (ref 0.0–5.0)
HCT: 31.7 % — ABNORMAL LOW (ref 36.0–46.0)
Hemoglobin: 10 g/dL — ABNORMAL LOW (ref 12.0–15.0)
Lymphocytes Relative: 16.7 % (ref 12.0–46.0)
Lymphs Abs: 2.1 10*3/uL (ref 0.7–4.0)
MCHC: 31.6 g/dL (ref 30.0–36.0)
MCV: 90.1 fl (ref 78.0–100.0)
Monocytes Absolute: 1 10*3/uL (ref 0.1–1.0)
Monocytes Relative: 7.7 % (ref 3.0–12.0)
Neutro Abs: 9.3 10*3/uL — ABNORMAL HIGH (ref 1.4–7.7)
Neutrophils Relative %: 73.9 % (ref 43.0–77.0)
Platelets: 469 10*3/uL — ABNORMAL HIGH (ref 150.0–400.0)
RBC: 3.52 Mil/uL — ABNORMAL LOW (ref 3.87–5.11)
RDW: 14.5 % (ref 11.5–15.5)
WBC: 12.6 10*3/uL — ABNORMAL HIGH (ref 4.0–10.5)

## 2023-06-18 LAB — URINALYSIS, ROUTINE W REFLEX MICROSCOPIC
Bilirubin Urine: NEGATIVE
Hgb urine dipstick: NEGATIVE
Leukocytes,Ua: NEGATIVE
Nitrite: NEGATIVE
Specific Gravity, Urine: 1.02 (ref 1.000–1.030)
Urine Glucose: NEGATIVE
Urobilinogen, UA: 2 — AB (ref 0.0–1.0)
pH: 7 (ref 5.0–8.0)

## 2023-06-18 LAB — LIPID PANEL
Cholesterol: 204 mg/dL — ABNORMAL HIGH (ref 0–200)
HDL: 76.8 mg/dL (ref >39.00)
LDL Cholesterol: 111 mg/dL — ABNORMAL HIGH (ref 0–99)
NonHDL: 127.23
Total CHOL/HDL Ratio: 3
Triglycerides: 82 mg/dL (ref 0.0–149.0)
VLDL: 16.4 mg/dL (ref 0.0–40.0)

## 2023-06-18 LAB — BASIC METABOLIC PANEL
BUN: 13 mg/dL (ref 6–23)
CO2: 34 mEq/L — ABNORMAL HIGH (ref 19–32)
Calcium: 9.2 mg/dL (ref 8.4–10.5)
Chloride: 101 mEq/L (ref 96–112)
Creatinine, Ser: 1.12 mg/dL (ref 0.40–1.20)
GFR: 53.45 mL/min — ABNORMAL LOW (ref >60.00)
Glucose, Bld: 77 mg/dL (ref 70–99)
Potassium: 4.1 mEq/L (ref 3.5–5.1)
Sodium: 142 mEq/L (ref 135–145)

## 2023-06-18 LAB — VITAMIN B12: Vitamin B-12: 342 pg/mL (ref 211–911)

## 2023-06-18 LAB — HEMOGLOBIN A1C: Hgb A1c MFr Bld: 5.9 % (ref 4.6–6.5)

## 2023-06-18 LAB — HEPATIC FUNCTION PANEL
ALT: 13 U/L (ref 0–35)
AST: 15 U/L (ref 0–37)
Albumin: 3.9 g/dL (ref 3.5–5.2)
Alkaline Phosphatase: 88 U/L (ref 39–117)
Bilirubin, Direct: 0.1 mg/dL (ref 0.0–0.3)
Total Bilirubin: 0.4 mg/dL (ref 0.2–1.2)
Total Protein: 6.7 g/dL (ref 6.0–8.3)

## 2023-06-18 LAB — VITAMIN D 25 HYDROXY (VIT D DEFICIENCY, FRACTURES): VITD: 28.58 ng/mL — ABNORMAL LOW (ref 30.00–100.00)

## 2023-06-18 LAB — MICROALBUMIN / CREATININE URINE RATIO
Creatinine,U: 363.7 mg/dL
Microalb Creat Ratio: 0.3 mg/g (ref 0.0–30.0)
Microalb, Ur: 1 mg/dL (ref 0.0–1.9)

## 2023-06-18 LAB — TSH: TSH: 0.38 u[IU]/mL (ref 0.35–5.50)

## 2023-06-18 MED ORDER — METHYLPREDNISOLONE ACETATE 80 MG/ML IJ SUSP
80.0000 mg | Freq: Once | INTRAMUSCULAR | Status: AC
Start: 2023-06-18 — End: 2023-06-18
  Administered 2023-06-18: 80 mg via INTRAMUSCULAR

## 2023-06-18 MED ORDER — FUROSEMIDE 20 MG PO TABS: ORAL_TABLET | ORAL | 5 refills | Status: DC

## 2023-06-18 MED ORDER — AIRSUPRA 90-80 MCG/ACT IN AERO: 2.0000 | INHALATION_SPRAY | Freq: Four times a day (QID) | RESPIRATORY_TRACT | 5 refills | Status: DC | PRN

## 2023-06-18 NOTE — Progress Notes (Unsigned)
Patient ID: [name], female   DOB: [birth year], 62 y.o.   MRN: [id number]         Chief Complaint:: wellness exam and Medical Management of Chronic Issues (ER follow up , swelling in both feet and left side of face also says she is having trouble breathing )  , iron def anemia, dm, asthma, vocal cord dysfunction, peripheral edema       HPI:  [name] is a 62 y.o. female here for wellness exam with neighbor friend; plans to call for mammogram soon; up to date                        Also Pt denies chest pain, increased sob or doe, orthopnea, PND, palpitations, dizziness or syncope, except for mild wheezing and sob , asking for depomedrol.  But does have mild ankle edema in the past wk as well.    No over bleeding, asks for iron lab,   Pt denies polydipsia, polyuria, or new focal neuro s/s.    Pt denies fever, wt loss, night sweats, loss of appetite, or other constitutional symptoms  Does also need referral Dr Jenne Pane for vocal cord dysfunction per pt.  Also ha   Wt Readings from Last 3 Encounters:  06/18/23 193 lb (87.5 kg)  06/03/23 193 lb 8 oz (87.8 kg)  05/06/23 192 lb 9.6 oz (87.4 kg)   BP Readings from Last 3 Encounters:  06/18/23 128/76  06/13/23 126/78  06/03/23 132/86   Immunization History  Administered Date(s) Administered   Influenza Split 10/23/2011   Influenza Whole 09/16/2012   Influenza,inj,Quad PF,6+ Mos 10/18/2014, 08/31/2015, 09/25/2016, 10/09/2017, 09/23/2018, 08/12/2019, 10/06/2020, 11/24/2021, 08/10/2022   PFIZER(Purple Top)SARS-COV-2 Vaccination 02/24/2020, 03/16/2020, 10/29/2020   PNEUMOCOCCAL CONJUGATE-20 06/15/2021   Pneumococcal Polysaccharide-23 12/02/2015   Td 04/22/2014   Zoster Recombinant(Shingrix) 11/26/2022, 02/18/2023   Zoster, Unspecified 11/19/2022   There are no preventive care reminders to display for this patient.     Past Medical History:  Diagnosis Date   Anemia, iron deficiency 12/22/2014   pt. 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      Bipolar disorder (HCC)    with anxiety, depression   COPD (chronic obstructive pulmonary disease) (HCC)    Eczema 05/18/2022   Fibromyalgia 05/14/2014   GERD (gastroesophageal reflux disease)    History of kidney stones    Hyperlipidemia 04/20/2017   HYPERTENSION 05/12/2009   Qualifier: Diagnosis of  By: Truman Hayward Duncan Dull), [name]     Peripheral vascular disease (HCC)    Pneumonia    Prediabetes 02/23/2014   pt. denies   Seizure (HCC)    Stroke (HCC) 11/2020   Urticaria    Past Surgical History:  Procedure Laterality Date   ABDOMINAL HYSTERECTOMY     ANTERIOR CERVICAL DECOMP/DISCECTOMY FUSION N/A 07/28/2020   Procedure: ANTERIOR CERVICAL DECOMPRESSION/DISCECTOMY FUSION. INTERBODY PROTHESIS, PLATE/SCREWS CERVICAL THREE- CERVICAL FOUR, CERVICAL FOUR- CERVICAL FIVE;  Surgeon: Tressie Stalker, MD;  Location: [facility] OR;  Service: Neurosurgery;  Laterality: N/A;   BACK SURGERY     COLONOSCOPY  12/20/2011   Procedure: COLONOSCOPY;  Surgeon: Freddy Jaksch, MD;  Location: [facility] ENDOSCOPY;  Service: Endoscopy;  Laterality: N/A;   COLONOSCOPY  03/05/2012   Procedure: COLONOSCOPY;  Surgeon: Freddy Jaksch, MD;  Location: [facility] ENDOSCOPY;  Service: Endoscopy;  Laterality: N/A;   DIAGNOSTIC LAPAROSCOPY     HEMORRHOID SURGERY     INCISE AND DRAIN ABCESS     KIDNEY STONE SURGERY     NECK SURGERY     x 2 Dr Terrilee Files   SPINE SURGERY  2019   TOE SURGERY     TUBAL LIGATION      reports that she quit smoking about 4 years ago. Her smoking use included cigarettes. She has a 9.00 pack-year smoking history. She has never used smokeless tobacco. She reports that she does not currently use alcohol. She reports that she does not currently use drugs after having used the following drugs:  Marijuana. family history includes Alcohol abuse in her brother and father; Anemia in her daughter, sister, and sister; Anesthesia problems in her father; Asthma in her brother, mother, and sister; Brain cancer in her sister; Breast cancer (age of onset: 23) in her sister; COPD in her mother and sister; Colon cancer (age of onset: 66) in her father; Diabetes in her brother, father, mother, sister, sister, sister, and sister; Heart disease in her brother, brother, mother, and sister; Hypertension in her brother, daughter, sister, sister, sister, sister, and sister; Kidney disease in her father; Sleep apnea in her brother. Allergies  Allergen Reactions   Sulfa Antibiotics Nausea And Vomiting and Other (See Comments)   Venofer [Iron Sucrose] Hives   Current Outpatient Medications on File Prior to Visit  Medication Sig Dispense Refill   albuterol (VENTOLIN HFA) 108 (90 Base) MCG/ACT inhaler INHALE 2 PUFFS INTO THE LUNGS EVERY 6 HOURS AS NEEDED FOR WHEEZING OR SHORTNESS OF BREATH 18 g 1   ARIPiprazole (ABILIFY) 10 MG tablet Take 10 mg by mouth daily.     atorvastatin (LIPITOR) 40 MG tablet Take 1 tablet (40 mg total) by mouth daily. TAKE 1 TABLET(40 MG) BY MOUTH DAILY Strength: 40 mg (Patient taking differently: Take 40 mg by mouth daily.) 90 tablet 3   azelastine (OPTIVAR) 0.05 % ophthalmic solution Place 1 drop into both eyes 2 (two) times daily. 6 mL 12   Budeson-Glycopyrrol-Formoterol (BREZTRI AEROSPHERE) 160-9-4.8 MCG/ACT AERO INHALE 2 PUFFS INTO THE LUNGS IN THE MORNING AND AT BEDTIME 10.7 g 11   budesonide (PULMICORT) 0.5 MG/2ML nebulizer solution Take 0.5 mg by nebulization daily.     carbamazepine (TEGRETOL) 200 MG tablet Take 2-3 tablets (400-600 mg total) by mouth See admin instructions. Take 400mg  in the morning and 600mg  at bedtime. 150 tablet 2   cetirizine (ZYRTEC) 10 MG tablet Can take one tablet by mouth once daily if needed for runny nose. 30 tablet 5   Cholecalciferol (VITAMIN D) 125  MCG (5000 UT) CAPS Take 5,000 Units by mouth daily.     cyclobenzaprine (FLEXERIL) 5 MG tablet Take 5 mg by mouth 3 (three) times daily as needed.     DUPIXENT 300 MG/2ML prefilled syringe Inject 300 mg into the skin every 14 (fourteen) days.     famotidine (PEPCID) 40 MG tablet Take 1 tablet (40 mg total) by mouth at bedtime. 30 tablet 3   ferrous sulfate 325 (65 FE) MG EC tablet Take 1 tablet (325 mg total) by mouth daily with breakfast. 90 tablet 1   fluticasone (FLONASE) 50 MCG/ACT nasal spray Place 1 spray into both nostrils 2 (two) times daily as needed for allergies or rhinitis. 16 g 5   gabapentin (NEURONTIN) 100 MG capsule Take 1 capsule (100 mg total) by mouth 3 (three)  times daily. 90 capsule 3   hydrochlorothiazide (MICROZIDE) 12.5 MG capsule Take 1 capsule (12.5 mg total) by mouth daily. TAKE 1 CAPSULE(12.5 MG) BY MOUTH DAILY Strength: 12.5 mg (Patient taking differently: Take 12.5 mg by mouth daily.) 90 capsule 3   hydrOXYzine (VISTARIL) 25 MG capsule Take 25 mg by mouth 2 (two) times daily as needed.     ipratropium (ATROVENT HFA) 17 MCG/ACT inhaler 2 puffs every 4-6 hours as needed. 12.9 g 5   ipratropium-albuterol (DUONEB) 0.5-2.5 (3) MG/3ML SOLN Take 3 mLs by nebulization 2 (two) times daily. 360 mL 3   irbesartan (AVAPRO) 75 MG tablet TAKE 1 TABLET(75 MG) BY MOUTH DAILY (Patient taking differently: Take 75 mg by mouth daily.) 90 tablet 1   levocetirizine (XYZAL) 5 MG tablet Take 5 mg by mouth daily.     montelukast (SINGULAIR) 10 MG tablet Take 1 tablet (10 mg total) by mouth at bedtime. 90 tablet 1   pantoprazole (PROTONIX) 40 MG tablet Take 1 tablet (40 mg total) by mouth 2 (two) times daily. 60 tablet 3   Skin Protectants, Misc. (EUCERIN) cream Apply 1 Application topically daily as needed for dry skin.     tacrolimus (PROTOPIC) 0.03 % ointment Apply 1 Application topically 2 (two) times daily as needed (rash). 60 g 5   triamcinolone ointment (KENALOG) 0.1 % Apply 1 Application  topically at bedtime. 30 g 5   Current Facility-Administered Medications on File Prior to Visit  Medication Dose Route Frequency Provider Last Rate Last Admin   dupilumab (DUPIXENT) prefilled syringe 300 mg  300 mg Subcutaneous Q14 Days Alfonse Spruce, MD   300 mg at 06/11/23 1325        ROS:  All others reviewed and negative.  Objective        PE:  BP 128/76 (BP Location: Right Arm, Patient Position: Sitting, Cuff Size: Normal)   Pulse 85   Temp 98.5 F (36.9 C) (Oral)   Ht 5\' 2"  (1.575 m)   Wt 193 lb (87.5 kg)   SpO2 96%   BMI 35.30 kg/m                 Constitutional: Pt appears in NAD               HENT: Head: NCAT.                Right Ear: External ear normal.                 Left Ear: External ear normal.                Eyes: . Pupils are equal, round, and reactive to light. Conjunctivae and EOM are normal               Nose: without d/c or deformity               Neck: Neck supple. Gross normal ROM               Cardiovascular: Normal rate and regular rhythm.                 Pulmonary/Chest: Effort normal and breath sounds without rales or wheezing.                Abd:  Soft, NT, ND, + BS, no organomegaly               Neurological: Pt is alert. At baseline orientation, motor grossly intact  Skin: Skin is warm. No rashes, no other new lesions, LE edema - trace bilateral ankle edema               Psychiatric: Pt behavior is normal without agitation   Micro: none  Cardiac tracings I have personally interpreted today:  none  Pertinent Radiological findings (summarize): none   Lab Results  Component Value Date   WBC 12.6 (H) 06/18/2023   HGB 10.0 (L) 06/18/2023   HCT 31.7 (L) 06/18/2023   PLT 469.0 (H) 06/18/2023   GLUCOSE 77 06/18/2023   CHOL 204 (H) 06/18/2023   TRIG 82.0 06/18/2023   HDL 76.80 06/18/2023   LDLCALC 111 (H) 06/18/2023   ALT 13 06/18/2023   AST 15 06/18/2023   NA 142 06/18/2023   K 4.1 06/18/2023   CL 101 06/18/2023    CREATININE 1.12 06/18/2023   BUN 13 06/18/2023   CO2 34 (H) 06/18/2023   TSH 0.38 06/18/2023   INR 0.9 03/14/2023   HGBA1C 5.9 06/18/2023   MICROALBUR 1.0 06/18/2023   Assessment/Plan:  Pragnya Corvi is a 62 y.o. Black or African American [2] female with  has a past medical history of Anemia, iron deficiency (12/22/2014), Arthritis, ASTHMA (05/12/2009), Bipolar disorder (HCC), COPD (chronic obstructive pulmonary disease) (HCC), Eczema (05/18/2022), Fibromyalgia (05/14/2014), GERD (gastroesophageal reflux disease), History of kidney stones, Hyperlipidemia (04/20/2017), HYPERTENSION (05/12/2009), Peripheral vascular disease (HCC), Pneumonia, Prediabetes (02/23/2014), Seizure (HCC), Stroke (HCC) (11/2020), and Urticaria.  Encounter for well adult exam with abnormal findings Age and sex appropriate education and counseling updated with regular exercise and diet Referrals for preventative services - plans to call for mammogram Immunizations addressed - none needed Smoking counseling  - none needed Evidence for depression or other mood disorder - chronic stable bipolar Most recent labs reviewed. I have personally reviewed and have noted: 1) the patient's medical and social history 2) The patient's current medications and supplements 3) The patient's height, weight, and BMI have been recorded in the chart   COPD exacerbation (HCC) Mild, for add airsupra prn, also depomedrol IM 80 mg  Essential (primary) hypertension BP Readings from Last 3 Encounters:  06/18/23 128/76  06/13/23 126/78  06/03/23 132/86   Stable, pt to continue medical treatment avapro 75 every day, hct 12.5 qd   Hyperlipidemia Lab Results  Component Value Date   LDLCALC 111 (H) 06/18/2023   uncontrolled, pt to continue current statin lipitor 40 every day, and low chol diet, encouraged good med compliance   Peripheral edema Mild worsening, likely pulm related, for lasix 20 d prn  Prediabetes BP  Readings from Last 3 Encounters:  06/18/23 128/76  06/13/23 126/78  06/03/23 132/86   Stable, pt to continue medical treatment  - diet, wt control   Vitamin D deficiency Last vitamin D Lab Results  Component Value Date   VD25OH 28.58 (L) 06/18/2023   Low, to start oral replacement   Vocal cord dysfunction Ok for ENT referral as requested  Followup: Return in about 6 months (around 12/19/2023).  Oliver Barre, MD 06/20/2023 4:51 PM [facility], [place] [facility]

## 2023-06-18 NOTE — Progress Notes (Signed)
The test results show that your current treatment is OK, as the tests are stable.  Please continue the same plan.  There is no other need for change of treatment or further evaluation based on these results, at this time.  thanks 

## 2023-06-18 NOTE — Patient Instructions (Signed)
You had the steroid shot today  Please take all new medication as prescribed - the lasix 20 mg per day as needed for swelling, as well as the Airsupra for wheezing as needed  Please continue all other medications as before, and refills have been done if requested.  Please have the pharmacy call with any other refills you may need.  Please continue your efforts at being more active, low cholesterol diet, and weight control.  You are otherwise up to date with prevention measures today.  Please keep your appointments with your specialists as you may have planned  You will be contacted regarding the referral for: Dr Jenne Pane ENT  Please go to the LAB at the blood drawing area for the tests to be done  You will be contacted by phone if any changes need to be made immediately.  Otherwise, you will receive a letter about your results with an explanation, but please check with MyChart first.  Please remember to sign up for MyChart if you have not done so, as this will be important to you in the future with finding out test results, communicating by private email, and scheduling acute appointments online when needed.  Please make an Appointment to return in 6 months, or sooner if needed

## 2023-06-19 DIAGNOSIS — J449 Chronic obstructive pulmonary disease, unspecified: Secondary | ICD-10-CM | POA: Diagnosis not present

## 2023-06-19 DIAGNOSIS — J9610 Chronic respiratory failure, unspecified whether with hypoxia or hypercapnia: Secondary | ICD-10-CM | POA: Diagnosis not present

## 2023-06-20 ENCOUNTER — Encounter: Payer: Self-pay | Admitting: Internal Medicine

## 2023-06-20 NOTE — Assessment & Plan Note (Signed)
Age and sex appropriate education and counseling updated with regular exercise and diet Referrals for preventative services - plans to call for mammogram Immunizations addressed - none needed Smoking counseling  - none needed Evidence for depression or other mood disorder - chronic stable bipolar Most recent labs reviewed. I have personally reviewed and have noted: 1) the patient's medical and social history 2) The patient's current medications and supplements 3) The patient's height, weight, and BMI have been recorded in the chart

## 2023-06-20 NOTE — Assessment & Plan Note (Signed)
BP Readings from Last 3 Encounters:  06/18/23 128/76  06/13/23 126/78  06/03/23 132/86   Stable, pt to continue medical treatment  - diet, wt control

## 2023-06-20 NOTE — Assessment & Plan Note (Signed)
Mild, for add airsupra prn, also depomedrol IM 80 mg

## 2023-06-20 NOTE — Assessment & Plan Note (Signed)
Mild worsening, likely pulm related, for lasix 20 d prn

## 2023-06-20 NOTE — Assessment & Plan Note (Signed)
BP Readings from Last 3 Encounters:  06/18/23 128/76  06/13/23 126/78  06/03/23 132/86   Stable, pt to continue medical treatment avapro 75 every day, hct 12.5 qd

## 2023-06-20 NOTE — Assessment & Plan Note (Signed)
Ok for ENT referral as requested

## 2023-06-20 NOTE — Assessment & Plan Note (Signed)
Last vitamin D Lab Results  Component Value Date   VD25OH 28.58 (L) 06/18/2023   Low, to start oral replacement

## 2023-06-20 NOTE — Assessment & Plan Note (Signed)
Lab Results  Component Value Date   LDLCALC 111 (H) 06/18/2023   uncontrolled, pt to continue current statin lipitor 40 every day, and low chol diet, encouraged good med compliance

## 2023-06-23 ENCOUNTER — Other Ambulatory Visit: Payer: Self-pay | Admitting: Allergy & Immunology

## 2023-06-23 ENCOUNTER — Other Ambulatory Visit: Payer: Self-pay | Admitting: Primary Care

## 2023-06-23 ENCOUNTER — Observation Stay (HOSPITAL_COMMUNITY)
Admission: EM | Admit: 2023-06-23 | Discharge: 2023-06-24 | Disposition: A | Discharge status: Home / Self Care | Payer: Medicare HMO | Attending: Internal Medicine | Admitting: Internal Medicine

## 2023-06-23 ENCOUNTER — Encounter (HOSPITAL_COMMUNITY): Payer: Self-pay

## 2023-06-23 ENCOUNTER — Other Ambulatory Visit: Payer: Self-pay

## 2023-06-23 ENCOUNTER — Emergency Department (HOSPITAL_COMMUNITY): Payer: Medicare HMO

## 2023-06-23 DIAGNOSIS — R0689 Other abnormalities of breathing: Secondary | ICD-10-CM | POA: Diagnosis not present

## 2023-06-23 DIAGNOSIS — F319 Bipolar disorder, unspecified: Secondary | ICD-10-CM | POA: Diagnosis present

## 2023-06-23 DIAGNOSIS — J45909 Unspecified asthma, uncomplicated: Secondary | ICD-10-CM | POA: Insufficient documentation

## 2023-06-23 DIAGNOSIS — K219 Gastro-esophageal reflux disease without esophagitis: Secondary | ICD-10-CM | POA: Insufficient documentation

## 2023-06-23 DIAGNOSIS — J45901 Unspecified asthma with (acute) exacerbation: Secondary | ICD-10-CM | POA: Diagnosis not present

## 2023-06-23 DIAGNOSIS — Z87891 Personal history of nicotine dependence: Secondary | ICD-10-CM | POA: Insufficient documentation

## 2023-06-23 DIAGNOSIS — R0602 Shortness of breath: Secondary | ICD-10-CM | POA: Diagnosis not present

## 2023-06-23 DIAGNOSIS — Z8673 Personal history of transient ischemic attack (TIA), and cerebral infarction without residual deficits: Secondary | ICD-10-CM | POA: Diagnosis not present

## 2023-06-23 DIAGNOSIS — R231 Pallor: Secondary | ICD-10-CM | POA: Diagnosis not present

## 2023-06-23 DIAGNOSIS — J9602 Acute respiratory failure with hypercapnia: Secondary | ICD-10-CM

## 2023-06-23 DIAGNOSIS — Z79899 Other long term (current) drug therapy: Secondary | ICD-10-CM | POA: Insufficient documentation

## 2023-06-23 DIAGNOSIS — J9601 Acute respiratory failure with hypoxia: Secondary | ICD-10-CM | POA: Diagnosis not present

## 2023-06-23 DIAGNOSIS — R0603 Acute respiratory distress: Secondary | ICD-10-CM | POA: Diagnosis not present

## 2023-06-23 DIAGNOSIS — I739 Peripheral vascular disease, unspecified: Secondary | ICD-10-CM | POA: Diagnosis present

## 2023-06-23 DIAGNOSIS — Z1152 Encounter for screening for COVID-19: Secondary | ICD-10-CM | POA: Insufficient documentation

## 2023-06-23 DIAGNOSIS — I10 Essential (primary) hypertension: Secondary | ICD-10-CM | POA: Diagnosis not present

## 2023-06-23 DIAGNOSIS — J9600 Acute respiratory failure, unspecified whether with hypoxia or hypercapnia: Secondary | ICD-10-CM | POA: Diagnosis not present

## 2023-06-23 DIAGNOSIS — J441 Chronic obstructive pulmonary disease with (acute) exacerbation: Secondary | ICD-10-CM | POA: Diagnosis not present

## 2023-06-23 DIAGNOSIS — I959 Hypotension, unspecified: Secondary | ICD-10-CM | POA: Diagnosis not present

## 2023-06-23 DIAGNOSIS — E785 Hyperlipidemia, unspecified: Secondary | ICD-10-CM | POA: Insufficient documentation

## 2023-06-23 LAB — TROPONIN I (HIGH SENSITIVITY)
Troponin I (High Sensitivity): 13 ng/L (ref <18)
Troponin I (High Sensitivity): 5 ng/L (ref <18)

## 2023-06-23 LAB — I-STAT VENOUS BLOOD GAS, ED
Acid-Base Excess: 9 mmol/L — ABNORMAL HIGH (ref 0.0–2.0)
Bicarbonate: 34.7 mmol/L — ABNORMAL HIGH (ref 20.0–28.0)
Calcium, Ion: 1.1 mmol/L — ABNORMAL LOW (ref 1.15–1.40)
HCT: 32 % — ABNORMAL LOW (ref 36.0–46.0)
Hemoglobin: 10.9 g/dL — ABNORMAL LOW (ref 12.0–15.0)
O2 Saturation: 87 %
Potassium: 3.9 mmol/L (ref 3.5–5.1)
Sodium: 140 mmol/L (ref 135–145)
TCO2: 36 mmol/L — ABNORMAL HIGH (ref 22–32)
pCO2, Ven: 50.7 mmHg (ref 44–60)
pH, Ven: 7.444 — ABNORMAL HIGH (ref 7.25–7.43)
pO2, Ven: 53 mmHg — ABNORMAL HIGH (ref 32–45)

## 2023-06-23 LAB — CBC WITH DIFFERENTIAL/PLATELET
Abs Immature Granulocytes: 0.13 10*3/uL — ABNORMAL HIGH (ref 0.00–0.07)
Basophils Absolute: 0 10*3/uL (ref 0.0–0.1)
Basophils Relative: 0 %
Eosinophils Absolute: 0.2 10*3/uL (ref 0.0–0.5)
Eosinophils Relative: 1 %
HCT: 32.2 % — ABNORMAL LOW (ref 36.0–46.0)
Hemoglobin: 10 g/dL — ABNORMAL LOW (ref 12.0–15.0)
Immature Granulocytes: 1 %
Lymphocytes Relative: 27 %
Lymphs Abs: 4.1 10*3/uL — ABNORMAL HIGH (ref 0.7–4.0)
MCH: 29 pg (ref 26.0–34.0)
MCHC: 31.1 g/dL (ref 30.0–36.0)
MCV: 93.3 fL (ref 80.0–100.0)
Monocytes Absolute: 1.6 10*3/uL — ABNORMAL HIGH (ref 0.1–1.0)
Monocytes Relative: 10 %
Neutro Abs: 9.3 10*3/uL — ABNORMAL HIGH (ref 1.7–7.7)
Neutrophils Relative %: 61 %
Platelets: 427 10*3/uL — ABNORMAL HIGH (ref 150–400)
RBC: 3.45 MIL/uL — ABNORMAL LOW (ref 3.87–5.11)
RDW: 13.6 % (ref 11.5–15.5)
WBC: 15.4 10*3/uL — ABNORMAL HIGH (ref 4.0–10.5)
nRBC: 0 % (ref 0.0–0.2)

## 2023-06-23 LAB — BASIC METABOLIC PANEL
Anion gap: 15 (ref 5–15)
BUN: 11 mg/dL (ref 6–20)
CO2: 29 mmol/L (ref 22–32)
Calcium: 9.2 mg/dL (ref 8.9–10.3)
Chloride: 97 mmol/L — ABNORMAL LOW (ref 98–111)
Creatinine, Ser: 1.05 mg/dL — ABNORMAL HIGH (ref 0.44–1.00)
GFR, Estimated: >60 mL/min (ref >60)
Glucose, Bld: 98 mg/dL (ref 70–99)
Potassium: 3.8 mmol/L (ref 3.5–5.1)
Sodium: 141 mmol/L (ref 135–145)

## 2023-06-23 LAB — I-STAT CHEM 8, ED
BUN: 12 mg/dL (ref 6–20)
Calcium, Ion: 1.09 mmol/L — ABNORMAL LOW (ref 1.15–1.40)
Chloride: 102 mmol/L (ref 98–111)
Creatinine, Ser: 1.1 mg/dL — ABNORMAL HIGH (ref 0.44–1.00)
Glucose, Bld: 98 mg/dL (ref 70–99)
HCT: 32 % — ABNORMAL LOW (ref 36.0–46.0)
Hemoglobin: 10.9 g/dL — ABNORMAL LOW (ref 12.0–15.0)
Potassium: 3.9 mmol/L (ref 3.5–5.1)
Sodium: 141 mmol/L (ref 135–145)
TCO2: 30 mmol/L (ref 22–32)

## 2023-06-23 LAB — SARS CORONAVIRUS 2 BY RT PCR: SARS Coronavirus 2 by RT PCR: NEGATIVE

## 2023-06-23 MED ORDER — ACETAMINOPHEN 325 MG PO TABS: 650.0000 mg | ORAL_TABLET | Freq: Four times a day (QID) | ORAL | Status: DC | PRN

## 2023-06-23 MED ORDER — METHYLPREDNISOLONE SODIUM SUCC 125 MG IJ SOLR
125.0000 mg | Freq: Once | INTRAMUSCULAR | Status: AC
  Administered 2023-06-23: 125 mg via INTRAVENOUS
  Filled 2023-06-23: qty 2

## 2023-06-23 MED ORDER — MAGNESIUM SULFATE 2 GM/50ML IV SOLN
2.0000 g | Freq: Once | INTRAVENOUS | Status: AC
  Administered 2023-06-23: 2 g via INTRAVENOUS
  Filled 2023-06-23: qty 50

## 2023-06-23 MED ORDER — ALBUTEROL SULFATE (2.5 MG/3ML) 0.083% IN NEBU: 2.5000 mg | INHALATION_SOLUTION | RESPIRATORY_TRACT | Status: DC | PRN

## 2023-06-23 MED ORDER — METHYLPREDNISOLONE SODIUM SUCC 125 MG IJ SOLR
60.0000 mg | Freq: Two times a day (BID) | INTRAMUSCULAR | Status: DC
  Administered 2023-06-24: 60 mg via INTRAVENOUS
  Filled 2023-06-23: qty 2

## 2023-06-23 MED ORDER — SODIUM CHLORIDE 0.9 % IV SOLN
1.0000 g | INTRAVENOUS | Status: DC
  Administered 2023-06-23: 1 g via INTRAVENOUS
  Filled 2023-06-23: qty 10

## 2023-06-23 MED ORDER — ENOXAPARIN SODIUM 40 MG/0.4ML IJ SOSY
40.0000 mg | PREFILLED_SYRINGE | INTRAMUSCULAR | Status: DC
  Administered 2023-06-23: 40 mg via SUBCUTANEOUS
  Filled 2023-06-23: qty 0.4

## 2023-06-23 MED ORDER — GUAIFENESIN ER 600 MG PO TB12
600.0000 mg | ORAL_TABLET | Freq: Two times a day (BID) | ORAL | Status: DC
  Administered 2023-06-23 – 2023-06-24 (×2): 600 mg via ORAL
  Filled 2023-06-23 (×2): qty 1

## 2023-06-23 MED ORDER — IPRATROPIUM-ALBUTEROL 0.5-2.5 (3) MG/3ML IN SOLN
3.0000 mL | Freq: Four times a day (QID) | RESPIRATORY_TRACT | Status: DC
  Administered 2023-06-23 – 2023-06-24 (×3): 3 mL via RESPIRATORY_TRACT
  Filled 2023-06-23 (×3): qty 3

## 2023-06-23 MED ORDER — ACETAMINOPHEN 650 MG RE SUPP: 650.0000 mg | Freq: Four times a day (QID) | RECTAL | Status: DC | PRN

## 2023-06-23 NOTE — ED Triage Notes (Signed)
Pt BIB EMS for resp distress. Pt ia from home and called out for shob. Hx of COPD. Pt had continuous albertold and hasn't felt well for a week. Pt lethargic, BP 86/62. EMS gave IM 0.3 epi.

## 2023-06-23 NOTE — H&P (Signed)
History and Physical    [name] ZOX:[id number] DOB: 1961-01-07 DOA: 06/23/2023  PCP: Corwin Levins, MD  Patient coming from: Home  Chief Complaint: Shortness of breath  HPI: [name] is a 62 y.o. 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  EMS was called out for worsening respiratory distress despite continuous albuterol administration at home.  Patient lethargic, hypoxic, and hypotensive with EMS and was given IM epinephrine and continuous albuterol x 1 hour.  Improved by the time she arrived to the ED and was placed on 2 L [place].  Afebrile.  Labs showing WBC 15.4, hemoglobin 10.0 (stable), platelet count 427k, sodium 141, potassium 3.8, chloride 97, bicarb 29, BUN 11, creatinine 11.0, glucose 98.  VBG with pH 7.44 and pCO2 50.7.  Initial troponin negative and repeat pending.  EKG without acute ischemic changes.  SARS-CoV-2 PCR negative.  Chest x-ray showing no active disease.  Patient received Solu-Medrol 125 mg and IV mag 2 g in the ED.    Patient reports one week history of worsening shortness of breath, wheezing, and productive cough.  She saw her PCP earlier this week and was given a steroid shot.  She is using her home inhalers as prescribed and due to worsening shortness of breath this past week she was using her home albuterol inhaler more than usual and ran out of it.  She is not on home oxygen.  She reports some mild left lower chest discomfort only when coughing but not otherwise.  Denies any chest pain at present.  No other complaints.  Review of Systems:  Review of Systems  All other systems reviewed and are negative.   Past Medical History:  Diagnosis Date   Anemia, iron deficiency 12/22/2014   pt. 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      Bipolar disorder (HCC)    with anxiety, depression   COPD (chronic obstructive pulmonary disease) (HCC)    Eczema 05/18/2022   Fibromyalgia 05/14/2014   GERD (gastroesophageal reflux disease)    History of kidney stones    Hyperlipidemia 04/20/2017   HYPERTENSION 05/12/2009   Qualifier: Diagnosis of  By: Truman Hayward Duncan Dull), [name]     Peripheral vascular disease (HCC)    Pneumonia    Prediabetes 02/23/2014   pt. denies   Seizure (HCC)    Stroke (HCC) 11/2020   Urticaria     Past Surgical History:  Procedure Laterality Date   ABDOMINAL HYSTERECTOMY     ANTERIOR CERVICAL DECOMP/DISCECTOMY FUSION N/A 07/28/2020   Procedure: ANTERIOR CERVICAL DECOMPRESSION/DISCECTOMY FUSION. INTERBODY PROTHESIS, PLATE/SCREWS CERVICAL THREE- CERVICAL FOUR, CERVICAL FOUR- CERVICAL FIVE;  Surgeon: Tressie Stalker, MD;  Location: [facility] OR;  Service: Neurosurgery;  Laterality: N/A;   BACK SURGERY     COLONOSCOPY  12/20/2011   Procedure: COLONOSCOPY;  Surgeon: Freddy Jaksch, MD;  Location: [facility] ENDOSCOPY;  Service: Endoscopy;  Laterality: N/A;   COLONOSCOPY  03/05/2012   Procedure: COLONOSCOPY;  Surgeon: Freddy Jaksch, MD;  Location: [facility] ENDOSCOPY;  Service: Endoscopy;  Laterality: N/A;   DIAGNOSTIC LAPAROSCOPY     HEMORRHOID SURGERY     INCISE AND DRAIN  ABCESS     KIDNEY STONE SURGERY     NECK SURGERY     x 2 Dr Terrilee Files   SPINE SURGERY  2019   TOE SURGERY     TUBAL LIGATION       reports that she quit smoking about 4 years ago. Her smoking use included cigarettes. She has a 9.00 pack-year smoking history. She has never used smokeless tobacco. She reports that she does not currently use alcohol. She reports that she does not currently use drugs after having used the following  drugs: Marijuana.  Allergies  Allergen Reactions   Sulfa Antibiotics Nausea And Vomiting and Other (See Comments)   Venofer [Iron Sucrose] Hives    Family History  Problem Relation Age of Onset   Diabetes Mother    COPD Mother    Heart disease Mother    Asthma Mother    Diabetes Father    Kidney disease Father    Anesthesia problems Father    Alcohol abuse Father    Colon cancer Father 29   Diabetes Sister    Hypertension Sister    Heart disease Sister    Diabetes Sister    Brain cancer Sister    Hypertension Sister    Asthma Sister    Diabetes Sister    COPD Sister    Hypertension Sister    Breast cancer Sister 45   Anemia Sister    Hypertension Sister    Diabetes Sister    Anemia Sister    Hypertension Sister    Diabetes Brother    Sleep apnea Brother    Asthma Brother    Alcohol abuse Brother    Heart disease Brother    Heart disease Brother    Hypertension Brother    Hypertension Daughter    Anemia Daughter    Allergic rhinitis Neg Hx    Eczema Neg Hx    Urticaria Neg Hx    Esophageal cancer Neg Hx    Prostate cancer Neg Hx    Rectal cancer Neg Hx     Prior to Admission medications   Medication Sig Start Date End Date Taking? Authorizing Provider  albuterol (VENTOLIN HFA) 108 (90 Base) MCG/ACT inhaler INHALE 2 PUFFS INTO THE LUNGS EVERY 6 HOURS AS NEEDED FOR WHEEZING OR SHORTNESS OF BREATH 04/29/23   Alfonse Spruce, MD  Albuterol-Budesonide [facility]) 90-80 MCG/ACT AERO Inhale 2 puffs into the lungs every 6 (six) hours as needed. 06/18/23   Corwin Levins, MD  ARIPiprazole (ABILIFY) 10 MG tablet Take 10 mg by mouth daily. 05/02/23   [provider]  atorvastatin (LIPITOR) 40 MG tablet Take 1 tablet (40 mg total) by mouth daily. TAKE 1 TABLET(40 MG) BY MOUTH DAILY Strength: 40 mg Patient taking differently: Take 40 mg by mouth daily. 05/23/22   Corwin Levins, MD  azelastine (OPTIVAR) 0.05 % ophthalmic solution Place 1 drop into both eyes 2 (two)  times daily. 05/18/22   Corwin Levins, MD  Budeson-Glycopyrrol-Formoterol (BREZTRI AEROSPHERE) 160-9-4.8 MCG/ACT AERO INHALE 2 PUFFS INTO THE LUNGS IN THE MORNING AND AT BEDTIME 06/03/23   Leslye Peer, MD  budesonide (PULMICORT) 0.5 MG/2ML nebulizer solution Take 0.5 mg by nebulization daily. 05/20/23   [provider]  carbamazepine (TEGRETOL) 200 MG tablet Take 2-3 tablets (400-600 mg total) by mouth See admin instructions. Take 400mg  in the morning and 600mg  at bedtime. 02/06/23   Plovsky, Earvin Hansen, MD  cetirizine (ZYRTEC) 10 MG tablet Can take one tablet  by mouth once daily if needed for runny nose. 02/28/23   Alfonse Spruce, MD  Cholecalciferol (VITAMIN D) 125 MCG (5000 UT) CAPS Take 5,000 Units by mouth daily.    [provider]  cyclobenzaprine (FLEXERIL) 5 MG tablet Take 5 mg by mouth 3 (three) times daily as needed. 05/19/23   [provider]  DUPIXENT 300 MG/2ML prefilled syringe Inject 300 mg into the skin every 14 (fourteen) days. 05/22/23   [provider]  famotidine (PEPCID) 40 MG tablet Take 1 tablet (40 mg total) by mouth at bedtime. 01/22/23   Glenford Bayley, NP  ferrous sulfate 325 (65 FE) MG EC tablet Take 1 tablet (325 mg total) by mouth daily with breakfast. 04/08/23   Corwin Levins, MD  fluticasone [facility]) 50 MCG/ACT nasal spray Place 1 spray into both nostrils 2 (two) times daily as needed for allergies or rhinitis. 02/28/23   Alfonse Spruce, MD  furosemide (LASIX) 20 MG tablet 1 tab by mouth once daily as needed 06/18/23   Corwin Levins, MD  gabapentin (NEURONTIN) 100 MG capsule Take 1 capsule (100 mg total) by mouth 3 (three) times daily. 12/21/22   Plovsky, Earvin Hansen, MD  hydrochlorothiazide (MICROZIDE) 12.5 MG capsule Take 1 capsule (12.5 mg total) by mouth daily. TAKE 1 CAPSULE(12.5 MG) BY MOUTH DAILY Strength: 12.5 mg Patient taking differently: Take 12.5 mg by mouth daily. 05/23/22   Corwin Levins, MD  hydrOXYzine (VISTARIL) 25 MG  capsule Take 25 mg by mouth 2 (two) times daily as needed. 05/02/23   [provider]  ipratropium (ATROVENT HFA) 17 MCG/ACT inhaler 2 puffs every 4-6 hours as needed. 02/28/23   Alfonse Spruce, MD  ipratropium-albuterol (DUONEB) 0.5-2.5 (3) MG/3ML SOLN Take 3 mLs by nebulization 2 (two) times daily. 01/15/23   Kathlen Mody, MD  irbesartan (AVAPRO) 75 MG tablet TAKE 1 TABLET(75 MG) BY MOUTH DAILY Patient taking differently: Take 75 mg by mouth daily. 11/06/21   Corwin Levins, MD  levocetirizine (XYZAL) 5 MG tablet Take 5 mg by mouth daily. 04/08/23   [provider]  montelukast (SINGULAIR) 10 MG tablet Take 1 tablet (10 mg total) by mouth at bedtime. 02/28/23   Alfonse Spruce, MD  pantoprazole (PROTONIX) 40 MG tablet Take 1 tablet (40 mg total) by mouth 2 (two) times daily. 01/22/23   Glenford Bayley, NP  Skin Protectants, Misc. (EUCERIN) cream Apply 1 Application topically daily as needed for dry skin.    [provider]  tacrolimus (PROTOPIC) 0.03 % ointment Apply 1 Application topically 2 (two) times daily as needed (rash). 02/28/23   Alfonse Spruce, MD  triamcinolone ointment (KENALOG) 0.1 % Apply 1 Application topically at bedtime. 02/28/23   Alfonse Spruce, MD    Physical Exam: Vitals:   06/23/23 1801 06/23/23 1802 06/23/23 1815 06/23/23 1830  BP:   95/78 106/62  Pulse:   96 96  Resp:   16 14  Temp:  97.6 F (36.4 C)    TempSrc:  Oral    SpO2:   100% 96%  Weight: 89.8 kg     Height: 5\' 3"  (1.6 m)       Physical Exam Vitals reviewed.  Constitutional:      General: She is not in acute distress. HENT:     Head: Normocephalic and atraumatic.  Eyes:     Extraocular Movements: Extraocular movements intact.  Cardiovascular:     Rate and Rhythm: Normal rate and regular  rhythm.     Pulses: Normal pulses.  Pulmonary:     Effort: No respiratory distress.     Breath sounds: No rales.     Comments: Diminished breath sounds  bilaterally Mild wheezing Abdominal:     General: Bowel sounds are normal. There is no distension.     Palpations: Abdomen is soft.     Tenderness: There is no abdominal tenderness.  Musculoskeletal:     Cervical back: Normal range of motion.     Right lower leg: No edema.     Left lower leg: No edema.  Skin:    General: Skin is warm and dry.  Neurological:     General: No focal deficit present.     Mental Status: She is alert and oriented to person, place, and time.     Labs on Admission: I have personally reviewed following labs and imaging studies  CBC: Recent Labs  Lab 06/18/23 1428 06/23/23 1806 06/23/23 1828  WBC 12.6*  --  15.4*  NEUTROABS 9.3*  --  9.3*  HGB 10.0* 10.9*  10.9* 10.0*  HCT 31.7* 32.0*  32.0* 32.2*  MCV 90.1  --  93.3  PLT 469.0*  --  427*   Basic Metabolic Panel: Recent Labs  Lab 06/18/23 1428 06/23/23 1806 06/23/23 1828  NA 142 141  140 141  K 4.1 3.9  3.9 3.8  CL 101 102 97*  CO2 34*  --  29  GLUCOSE 77 98 98  BUN 13 12 11   CREATININE 1.12 1.10* 1.05*  CALCIUM 9.2  --  9.2   GFR: Estimated Creatinine Clearance: 60.6 mL/min (A) (by C-G formula based on SCr of 1.05 mg/dL (H)). Liver Function Tests: Recent Labs  Lab 06/18/23 1428  AST 15  ALT 13  ALKPHOS 88  BILITOT 0.4  PROT 6.7  ALBUMIN 3.9   No results for input(s): "LIPASE", "AMYLASE" in the last 168 hours. No results for input(s): "AMMONIA" in the last 168 hours. Coagulation Profile: No results for input(s): "INR", "PROTIME" in the last 168 hours. Cardiac Enzymes: No results for input(s): "CKTOTAL", "CKMB", "CKMBINDEX", "TROPONINI" in the last 168 hours. BNP (last 3 results) No results for input(s): "PROBNP" in the last 8760 hours. HbA1C: No results for input(s): "HGBA1C" in the last 72 hours. CBG: No results for input(s): "GLUCAP" in the last 168 hours. Lipid Profile: No results for input(s): "CHOL", "HDL", "LDLCALC", "TRIG", "CHOLHDL", "LDLDIRECT" in the last  72 hours. Thyroid Function Tests: No results for input(s): "TSH", "T4TOTAL", "FREET4", "T3FREE", "THYROIDAB" in the last 72 hours. Anemia Panel: No results for input(s): "VITAMINB12", "FOLATE", "FERRITIN", "TIBC", "IRON", "RETICCTPCT" in the last 72 hours. Urine analysis:    Component Value Date/Time   COLORURINE YELLOW 06/18/2023 1428   APPEARANCEUR CLEAR 06/18/2023 1428   LABSPEC 1.020 06/18/2023 1428   PHURINE 7.0 06/18/2023 1428   GLUCOSEU NEGATIVE 06/18/2023 1428   HGBUR NEGATIVE 06/18/2023 1428   BILIRUBINUR NEGATIVE 06/18/2023 1428   KETONESUR TRACE (A) 06/18/2023 1428   PROTEINUR NEGATIVE 05/05/2021 1922   UROBILINOGEN 2.0 (A) 06/18/2023 1428   NITRITE NEGATIVE 06/18/2023 1428   LEUKOCYTESUR NEGATIVE 06/18/2023 1428    Radiological Exams on Admission: DG Chest Portable 1 View  Result Date: 06/23/2023 CLINICAL DATA:  Shortness of breath EXAM: PORTABLE CHEST 1 VIEW COMPARISON:  Chest x-ray 06/13/2023 FINDINGS: The heart size and mediastinal contours are within normal limits. Both lungs are clear. Cervical spinal fusion plate is again seen. No acute fractures. IMPRESSION: No active disease. Electronically  Signed   By: Darliss Cheney M.D.   On: 06/23/2023 19:07    EKG: Independently reviewed. Sinus rhythm, no acute ischemic changes.   Assessment and Plan  Asthma-COPD overlap syndrome acute severe exacerbation Acute hypoxemic hypercapnic respiratory failure Patient presenting with one week history of worsening dyspnea, productive cough, and wheezing.  EMS called out for respiratory distress despite continuous albuterol administration at home.  She was initially lethargic, hypoxic, and hypotensive with EMS and was given IM epinephrine and continuous albuterol x 1 hour.  Also received Solu-Medrol 125 mg and IV mag 2 g in the ED.  Now significantly improved after treatments and stable on 2 L [place].  Remainder of vital signs currently stable.  Chest x-ray showing no active disease.   SARS-CoV-2 PCR negative.  Continue treatment with Solu-Medrol 60 mg every 12 hours, scheduled DuoNeb every 6 hours, albuterol neb every 2 hours as needed, ceftriaxone, and Mucinex.  Pulmonary hygiene.  RT consulted.  Continue supplemental oxygen, wean as tolerated.  She is not on home oxygen.  She will benefit from inpatient ENT evaluation given history of vocal cord dysfunction, please consult in the morning.  Hypertension Hyperlipidemia GERD Bipolar disorder Liver cirrhosis: No signs of ascites or hepatic encephalopathy at this time. PVD History of seizure History of stroke  Pharmacy med rec pending.  DVT prophylaxis: Lovenox Code Status: Full Code (discussed with the patient) Family Communication: No family available at this time. Level of care: Progressive Care Unit Admission status: It is my clinical opinion that referral for OBSERVATION is reasonable and necessary in this patient based on the above information provided. The aforementioned taken together are felt to place the patient at high risk for further clinical deterioration. However, it is anticipated that the patient may be medically stable for discharge from the hospital within 24 to 48 hours.   John Giovanni MD [facility]  If 7PM-7AM, please contact night-coverage [email or web address]  06/23/2023, 8:20 PM

## 2023-06-23 NOTE — ED Provider Notes (Signed)
[place] EMERGENCY DEPARTMENT AT [facility] Provider Note   CSN: [id number] Arrival date & time: 06/23/23  1754     History Chief Complaint  Patient presents with   Respiratory Distress    HPI [name] is a 62 y.o. female presenting for altered mental status from home.  She is a 62 year old female with an extensive medical history.  Endorses a history of asthma and COPD.  EMS was called out for worsening respiratory distress despite continuous albuterol administration at home.  They found patient hypoxic hypotensive and altered likely hypercarbic.  They treated her with IM epinephrine continuous albuterol x 1 hour and brought patient in for further assessment.  She is gradually improving on arrival to the emergency room.  States that she feels comfortable with the current treatments and feels that she is improving.  Patient states that this has been coming on over the past few days and not responsive to her home therapies.   Patient's recorded medical, surgical, social, medication list and allergies were reviewed in the Snapshot window as part of the initial history.   Review of Systems   Review of Systems  Constitutional:  Positive for fatigue. Negative for chills and fever.  HENT:  Negative for ear pain and sore throat.   Eyes:  Negative for pain and visual disturbance.  Respiratory:  Positive for shortness of breath and wheezing. Negative for cough.   Cardiovascular:  Negative for chest pain and palpitations.  Gastrointestinal:  Negative for abdominal pain and vomiting.  Genitourinary:  Negative for dysuria and hematuria.  Musculoskeletal:  Negative for arthralgias and back pain.  Skin:  Negative for color change and rash.  Neurological:  Negative for seizures and syncope.  All other systems reviewed and are negative.   Physical Exam Updated Vital Signs BP 106/62   Pulse 96   Temp 97.6 F (36.4 C) (Oral)   Resp 14   Ht 5\' 3"  (1.6 m)   Wt  89.8 kg   SpO2 96%   BMI 35.07 kg/m  Physical Exam Vitals and nursing note reviewed.  Constitutional:      General: She is not in acute distress.    Appearance: She is well-developed.  HENT:     Head: Normocephalic and atraumatic.  Eyes:     Conjunctiva/sclera: Conjunctivae normal.  Cardiovascular:     Rate and Rhythm: Normal rate and regular rhythm.     Heart sounds: No murmur heard. Pulmonary:     Effort: Pulmonary effort is normal. No respiratory distress.     Breath sounds: Normal breath sounds.  Abdominal:     Palpations: Abdomen is soft.     Tenderness: There is no abdominal tenderness.  Musculoskeletal:        General: No swelling.     Cervical back: Neck supple.  Skin:    General: Skin is warm and dry.     Capillary Refill: Capillary refill takes less than 2 seconds.  Neurological:     Mental Status: She is alert.  Psychiatric:        Mood and Affect: Mood normal.      ED Course/ Medical Decision Making/ A&P    Procedures .Critical Care  Performed by: Glyn Ade, MD Authorized by: Glyn Ade, MD   Critical care provider statement:    Critical care time (minutes):  45   Critical care time was exclusive of:  Separately billable procedures and treating other patients and teaching time   Critical care was  necessary to treat or prevent imminent or life-threatening deterioration of the following conditions:  Respiratory failure   Critical care was time spent personally by me on the following activities:  Development of treatment plan with patient or surrogate, discussions with consultants, evaluation of patient's response to treatment, examination of patient, ordering and review of laboratory studies, ordering and review of radiographic studies, ordering and performing treatments and interventions, pulse oximetry, re-evaluation of patient's condition and review of old charts   Care discussed with: admitting provider      Medications Ordered in  ED Medications  magnesium sulfate IVPB 2 g 50 mL (0 g Intravenous Stopped 06/23/23 1922)  methylPREDNISolone sodium succinate (SOLU-MEDROL) 125 mg/2 mL injection 125 mg (125 mg Intravenous Given 06/23/23 1822)    Medical Decision Making:   [name] is a 62 y.o. female who presented to the ED today with respiratory distress detailed above.      Complete initial physical exam performed, notably the patient  was critically ill desaturating, with diffuse wheezing, altered mental status.      Reviewed and confirmed nursing documentation for past medical history, family history, social history.    Initial Assessment:   Patient presenting with combined hypercarbic hypoxic respiratory distress syndrome with near arrest. Aggressively resuscitated by EMS with Solu-Medrol, epinephrine, albuterol and reassess frequently while in the emergency room. Patient had gradual improvement.  Observed in the emergency room for 2 hours with interval improvement. Objective findings demonstrated a hypercapnia that is compensated and relatively mild.  Appears to be in acute compensation for her bicarbonate elevation. Has a leukocytosis likely related to recent steroid administration. Will plan for serial reassessments and emergency room  Reassessment and Plan:   Reassessed after 2 and half hours, improving but still ill.  Endorsing a sore throat as her initial symptoms today.  Consulted hospitalist who agrees with need for admission.  Given severity of initial presentation, favor patient will need close care and management this evening. Patient arranged for admission with no further acute events.   Disposition:   Based on the above findings, I believe this patient is stable for admission.    Patient/family educated about specific findings on our evaluation and explained exact reasons for admission.  Patient/family educated about clinical situation and time was allowed to answer questions.    Admission team communicated with and agreed with need for admission. Patient admitted. Patient ready to move at this time.     Emergency Department Medication Summary:   Medications  magnesium sulfate IVPB 2 g 50 mL (0 g Intravenous Stopped 06/23/23 1922)  methylPREDNISolone sodium succinate (SOLU-MEDROL) 125 mg/2 mL injection 125 mg (125 mg Intravenous Given 06/23/23 1822)         Clinical Impression:  1. Respiratory distress      Admit   Final Clinical Impression(s) / ED Diagnoses Final diagnoses:  Respiratory distress    Rx / DC Orders ED Discharge Orders     None         Glyn Ade, MD 06/23/23 2009

## 2023-06-23 NOTE — ED Notes (Signed)
RN placed pt on 2L for comfort. O2 at 99%. Pt in no apparent distress at this time.

## 2023-06-24 DIAGNOSIS — J441 Chronic obstructive pulmonary disease with (acute) exacerbation: Secondary | ICD-10-CM | POA: Diagnosis not present

## 2023-06-24 DIAGNOSIS — J45901 Unspecified asthma with (acute) exacerbation: Secondary | ICD-10-CM | POA: Diagnosis not present

## 2023-06-24 LAB — CBC
HCT: 32.2 % — ABNORMAL LOW (ref 36.0–46.0)
Hemoglobin: 9.9 g/dL — ABNORMAL LOW (ref 12.0–15.0)
MCH: 28.5 pg (ref 26.0–34.0)
MCHC: 30.7 g/dL (ref 30.0–36.0)
MCV: 92.8 fL (ref 80.0–100.0)
Platelets: 424 10*3/uL — ABNORMAL HIGH (ref 150–400)
RBC: 3.47 MIL/uL — ABNORMAL LOW (ref 3.87–5.11)
RDW: 13.6 % (ref 11.5–15.5)
WBC: 15.8 10*3/uL — ABNORMAL HIGH (ref 4.0–10.5)
nRBC: 0 % (ref 0.0–0.2)

## 2023-06-24 MED ORDER — PREDNISONE 10 MG PO TABS: 40.0000 mg | ORAL_TABLET | Freq: Every day | ORAL | 0 refills | Status: AC

## 2023-06-24 MED ORDER — BREZTRI AEROSPHERE 160-9-4.8 MCG/ACT IN AERO: 2.0000 | INHALATION_SPRAY | Freq: Two times a day (BID) | RESPIRATORY_TRACT | 11 refills | Status: DC

## 2023-06-24 MED ORDER — ALBUTEROL SULFATE HFA 108 (90 BASE) MCG/ACT IN AERS: 2.0000 | INHALATION_SPRAY | Freq: Four times a day (QID) | RESPIRATORY_TRACT | 1 refills | Status: DC | PRN

## 2023-06-24 NOTE — ED Notes (Signed)
Ambulated patient in hallway states she feels good no increaed sob, sat 97 % upon getting back on stretcher.

## 2023-06-24 NOTE — ED Notes (Signed)
Discontinued o2  will ambulate

## 2023-06-24 NOTE — Discharge Summary (Signed)
Physician Discharge Summary  [name] WGN:[id number] DOB: [birth year] DOA: 06/23/2023  PCP: Corwin Levins, MD  Admit date: 06/23/2023  Discharge date: 06/24/2023  Admitted From:Home  Disposition:  Home  Recommendations for Outpatient Follow-up:  Follow up with PCP in 1-2 weeks Follow-up with ENT Dr. Jenne Pane for vocal cord dysfunction.  Patient is awaiting appointment Given refills on breathing treatments as well as Breztri which she had run out of Continue on prednisone for 5 more days Continue on other home medications as prior  Home Health: None  Equipment/Devices: None  Discharge Condition:Stable  CODE STATUS: Full  Diet recommendation: Heart Healthy  Brief/Interim Summary: [name] is a 62 y.o. 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  She was admitted for asthma/COPD with severe exacerbation and associated acute hypoxemic/hypercapnic respiratory failure.  She was started on breathing treatments and IV Solu-Medrol and rapidly improved and is now ready for discharge on oral prednisone as well as refills on breathing treatments.  No other acute events or concerns noted throughout the course of this brief admission.  She was able to ambulate without use of oxygen.  Discharge Diagnoses:  Principal Problem:   Asthma/COPD exacerbation Active Problems:   HTN (hypertension)   Peripheral vascular disease (HCC)   GERD (gastroesophageal reflux disease)   Bipolar I disorder (HCC)   HLD (hyperlipidemia)   Acute respiratory failure with hypoxia and hypercapnia (HCC)  Principal discharge diagnosis: Asthma/COPD exacerbation with associated acute hypoxemic respiratory failure.  Discharge Instructions  Discharge  Instructions     Diet - low sodium heart healthy   Complete by: As directed    Increase activity slowly   Complete by: As directed       Allergies as of 06/24/2023       Reactions   Sulfa Antibiotics Nausea And Vomiting, Other (See Comments)   Venofer [iron Sucrose] Hives        Medication List     TAKE these medications    Airsupra 90-80 MCG/ACT Aero Generic drug: Albuterol-Budesonide Inhale 2 puffs into the lungs every 6 (six) hours as needed.   albuterol 108 (90 Base) MCG/ACT inhaler Commonly known as: VENTOLIN HFA Inhale 2 puffs into the lungs every 6 (six) hours as needed for wheezing or shortness of breath.   ARIPiprazole 10 MG tablet Commonly known as: ABILIFY Take 10 mg by mouth daily.   atorvastatin 40 MG tablet Commonly known as: LIPITOR Take 1 tablet (40 mg total) by mouth daily. TAKE 1 TABLET(40 MG) BY MOUTH DAILY Strength: 40 mg What changed: additional instructions   azelastine 0.05 % ophthalmic solution Commonly known as: OPTIVAR Place 1 drop into both eyes 2 (two) times daily.   Breztri Aerosphere 160-9-4.8 MCG/ACT Aero Generic drug: Budeson-Glycopyrrol-Formoterol Inhale 2 puffs into the lungs in the morning and at bedtime.   budesonide 0.5 MG/2ML nebulizer solution Commonly known as: PULMICORT Take 0.5 mg by nebulization daily.   carbamazepine 200 MG tablet Commonly known as: TEGretol Take 2-3 tablets (400-600 mg total) by mouth See admin instructions. Take 400mg  in the morning and 600mg  at bedtime.   cetirizine 10 MG tablet Commonly known as: ZYRTEC Can take one tablet by mouth once daily if needed for runny nose.   cyclobenzaprine 5 MG tablet Commonly known as: FLEXERIL Take 5 mg by mouth 3 (three) times  daily as needed.   Dupixent 300 MG/2ML prefilled syringe Generic drug: dupilumab Inject 300 mg into the skin every 14 (fourteen) days. Every other Wednesday   eucerin cream Apply 1 Application topically daily as needed for dry  skin.   famotidine 40 MG tablet Commonly known as: PEPCID Take 1 tablet (40 mg total) by mouth at bedtime.   ferrous sulfate 325 (65 FE) MG EC tablet Take 1 tablet (325 mg total) by mouth daily with breakfast.   fluticasone 50 MCG/ACT nasal spray Commonly known as: FLONASE Place 1 spray into both nostrils 2 (two) times daily as needed for allergies or rhinitis.   furosemide 20 MG tablet Commonly known as: LASIX 1 tab by mouth once daily as needed What changed:  how much to take how to take this when to take this reasons to take this additional instructions   gabapentin 100 MG capsule Commonly known as: NEURONTIN Take 1 capsule (100 mg total) by mouth 3 (three) times daily.   hydrochlorothiazide 12.5 MG capsule Commonly known as: MICROZIDE Take 1 capsule (12.5 mg total) by mouth daily. TAKE 1 CAPSULE(12.5 MG) BY MOUTH DAILY Strength: 12.5 mg What changed: additional instructions   hydrOXYzine 25 MG capsule Commonly known as: VISTARIL Take 25 mg by mouth 2 (two) times daily as needed.   ipratropium 17 MCG/ACT inhaler Commonly known as: ATROVENT HFA 2 puffs every 4-6 hours as needed.   ipratropium-albuterol 0.5-2.5 (3) MG/3ML Soln Commonly known as: DUONEB Take 3 mLs by nebulization 2 (two) times daily.   irbesartan 75 MG tablet Commonly known as: AVAPRO TAKE 1 TABLET(75 MG) BY MOUTH DAILY What changed: See the new instructions.   montelukast 10 MG tablet Commonly known as: SINGULAIR Take 1 tablet (10 mg total) by mouth at bedtime.   pantoprazole 40 MG tablet Commonly known as: Protonix Take 1 tablet (40 mg total) by mouth 2 (two) times daily.   predniSONE 10 MG tablet Commonly known as: DELTASONE Take 4 tablets (40 mg total) by mouth daily for 5 days.   tacrolimus 0.03 % ointment Commonly known as: Protopic Apply 1 Application topically 2 (two) times daily as needed (rash). What changed: reasons to take this   triamcinolone ointment 0.1 % Commonly  known as: KENALOG Apply 1 Application topically at bedtime.        Follow-up Information     Connect with your PCP/Specialist as discussed. Schedule an appointment as soon as possible for a visit .   Contact information: [email or web address] Call our physician referral line at [phone].               Allergies  Allergen Reactions   Sulfa Antibiotics Nausea And Vomiting and Other (See Comments)   Venofer [Iron Sucrose] Hives    Consultations: None   Procedures/Studies: DG Chest Portable 1 View  Result Date: 06/23/2023 CLINICAL DATA:  Shortness of breath EXAM: PORTABLE CHEST 1 VIEW COMPARISON:  Chest x-ray 06/13/2023 FINDINGS: The heart size and mediastinal contours are within normal limits. Both lungs are clear. Cervical spinal fusion plate is again seen. No acute fractures. IMPRESSION: No active disease. Electronically Signed   By: Darliss Cheney M.D.   On: 06/23/2023 19:07   DG Chest Portable 1 View  Result Date: 06/13/2023 CLINICAL DATA:  Shortness of breath EXAM: PORTABLE CHEST 1 VIEW COMPARISON:  X-ray 02/21/2023 and older. Chest CT without contrast 02/22/2023 FINDINGS: No consolidation, pneumothorax or effusion. Normal cardiopericardial silhouette without edema. Calcified and tortuous aorta. Overlapping cardiac leads. Degenerative changes along  the spine. Fixation hardware seen along the lower cervical spine at the edge of the imaging field. IMPRESSION: No acute cardiopulmonary disease Electronically Signed   By: Karen Kays M.D.   On: 06/13/2023 12:03     Discharge Exam: Vitals:   06/24/23 0735 06/24/23 0814  BP: (!) 121/110   Pulse: 88   Resp: 18   Temp: 97.9 F (36.6 C) 98 F (36.7 C)  SpO2: 100%    Vitals:   06/24/23 0645 06/24/23 0700 06/24/23 0735 06/24/23 0814  BP: 121/72 118/61 (!) 121/110   Pulse: 75 79 88   Resp: 15 14 18    Temp:   97.9 F (36.6 C) 98 F (36.7 C)  TempSrc:   Oral   SpO2: 99% 100% 100%   Weight:       Height:        General: Pt is alert, awake, not in acute distress, obese Cardiovascular: RRR, S1/S2 +, no rubs, no gallops Respiratory: CTA bilaterally, no wheezing, no rhonchi Abdominal: Soft, NT, ND, bowel sounds + Extremities: no edema, no cyanosis    The results of significant diagnostics from this hospitalization (including imaging, microbiology, ancillary and laboratory) are listed below for reference.     Microbiology: Recent Results (from the past 240 hour(s))  SARS Coronavirus 2 by RT PCR (hospital order, performed in [facility] hospital lab) *cepheid single result test* Anterior Nasal Swab     Status: None   Collection Time: 06/23/23  6:28 PM   Specimen: Anterior Nasal Swab  Result Value Ref Range Status   SARS Coronavirus 2 by RT PCR NEGATIVE NEGATIVE Final    Comment: Performed at [facility], [address], [place], [place] [address]     Labs: BNP (last 3 results) Recent Labs    10/07/22 2000 02/23/23 1809  BNP 24.1 21.1   Basic Metabolic Panel: Recent Labs  Lab 06/18/23 1428 06/23/23 1806 06/23/23 1828  NA 142 141  140 141  K 4.1 3.9  3.9 3.8  CL 101 102 97*  CO2 34*  --  29  GLUCOSE 77 98 98  BUN 13 12 11   CREATININE 1.12 1.10* 1.05*  CALCIUM 9.2  --  9.2   Liver Function Tests: Recent Labs  Lab 06/18/23 1428  AST 15  ALT 13  ALKPHOS 88  BILITOT 0.4  PROT 6.7  ALBUMIN 3.9   No results for input(s): "LIPASE", "AMYLASE" in the last 168 hours. No results for input(s): "AMMONIA" in the last 168 hours. CBC: Recent Labs  Lab 06/18/23 1428 06/23/23 1806 06/23/23 1828 06/24/23 0500  WBC 12.6*  --  15.4* 15.8*  NEUTROABS 9.3*  --  9.3*  --   HGB 10.0* 10.9*  10.9* 10.0* 9.9*  HCT 31.7* 32.0*  32.0* 32.2* 32.2*  MCV 90.1  --  93.3 92.8  PLT 469.0*  --  427* 424*   Cardiac Enzymes: No results for input(s): "CKTOTAL", "CKMB", "CKMBINDEX", "TROPONINI" in the last 168 hours. BNP: Invalid input(s): "POCBNP" CBG: No results  for input(s): "GLUCAP" in the last 168 hours. D-Dimer No results for input(s): "DDIMER" in the last 72 hours. Hgb A1c No results for input(s): "HGBA1C" in the last 72 hours. Lipid Profile No results for input(s): "CHOL", "HDL", "LDLCALC", "TRIG", "CHOLHDL", "LDLDIRECT" in the last 72 hours. Thyroid function studies No results for input(s): "TSH", "T4TOTAL", "T3FREE", "THYROIDAB" in the last 72 hours.  Invalid input(s): "FREET3" Anemia work up No results for input(s): "VITAMINB12", "FOLATE", "FERRITIN", "TIBC", "IRON", "  RETICCTPCT" in the last 72 hours. Urinalysis    Component Value Date/Time   COLORURINE YELLOW 06/18/2023 1428   APPEARANCEUR CLEAR 06/18/2023 1428   LABSPEC 1.020 06/18/2023 1428   PHURINE 7.0 06/18/2023 1428   GLUCOSEU NEGATIVE 06/18/2023 1428   HGBUR NEGATIVE 06/18/2023 1428   BILIRUBINUR NEGATIVE 06/18/2023 1428   KETONESUR TRACE (A) 06/18/2023 1428   PROTEINUR NEGATIVE 05/05/2021 1922   UROBILINOGEN 2.0 (A) 06/18/2023 1428   NITRITE NEGATIVE 06/18/2023 1428   LEUKOCYTESUR NEGATIVE 06/18/2023 1428   Sepsis Labs Recent Labs  Lab 06/18/23 1428 06/23/23 1828 06/24/23 0500  WBC 12.6* 15.4* 15.8*   Microbiology Recent Results (from the past 240 hour(s))  SARS Coronavirus 2 by RT PCR (hospital order, performed in [facility] hospital lab) *cepheid single result test* Anterior Nasal Swab     Status: None   Collection Time: 06/23/23  6:28 PM   Specimen: Anterior Nasal Swab  Result Value Ref Range Status   SARS Coronavirus 2 by RT PCR NEGATIVE NEGATIVE Final    Comment: Performed at [facility], [address], [place], [place] [address]     Time coordinating discharge: 35 minutes  SIGNED:   Erick Blinks, DO [facility] 06/24/2023, 10:21 AM  If 7PM-7AM, please contact night-coverage [email or web address]

## 2023-06-25 ENCOUNTER — Ambulatory Visit (HOSPITAL_COMMUNITY): Payer: Medicare HMO

## 2023-06-27 ENCOUNTER — Ambulatory Visit: Payer: Medicare HMO

## 2023-06-27 ENCOUNTER — Ambulatory Visit (HOSPITAL_COMMUNITY): Payer: Medicare HMO

## 2023-06-28 DIAGNOSIS — J449 Chronic obstructive pulmonary disease, unspecified: Secondary | ICD-10-CM | POA: Diagnosis not present

## 2023-06-28 DIAGNOSIS — R32 Unspecified urinary incontinence: Secondary | ICD-10-CM | POA: Diagnosis not present

## 2023-07-02 ENCOUNTER — Ambulatory Visit (INDEPENDENT_AMBULATORY_CARE_PROVIDER_SITE_OTHER): Payer: Medicare HMO

## 2023-07-02 ENCOUNTER — Ambulatory Visit (HOSPITAL_COMMUNITY): Payer: Medicare HMO

## 2023-07-02 DIAGNOSIS — J4550 Severe persistent asthma, uncomplicated: Secondary | ICD-10-CM

## 2023-07-02 IMAGING — DX DG KNEE AP/LAT W/ SUNRISE*R*
3 series · 3 of 3 positions shown · non-contrast
Comparison: None Available.

CLINICAL DATA: Right knee pain and swelling for 1 week after fall.

EXAM:
RIGHT KNEE 3 VIEWS

[knee ap]
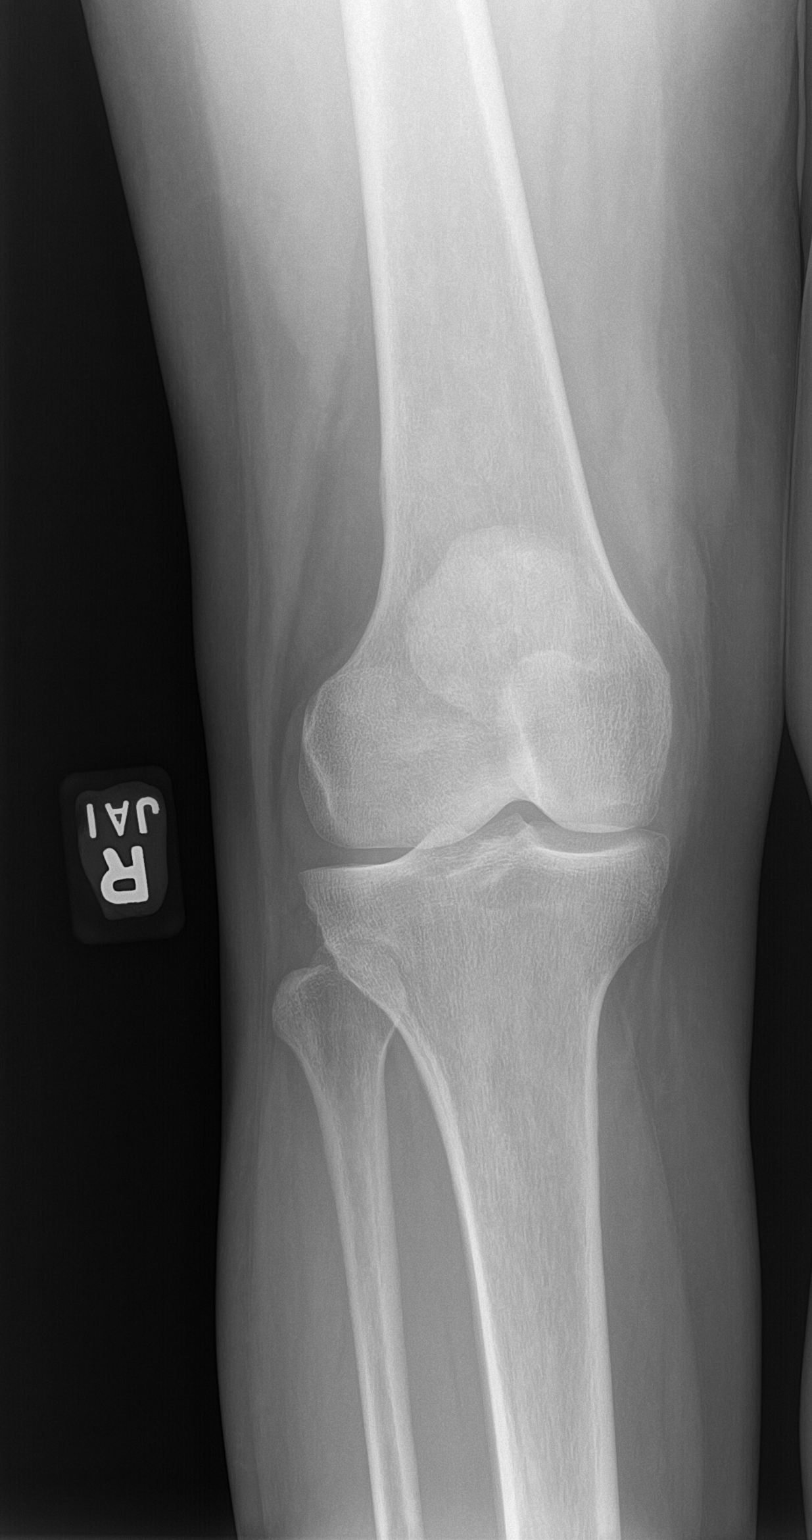

[knee lat]
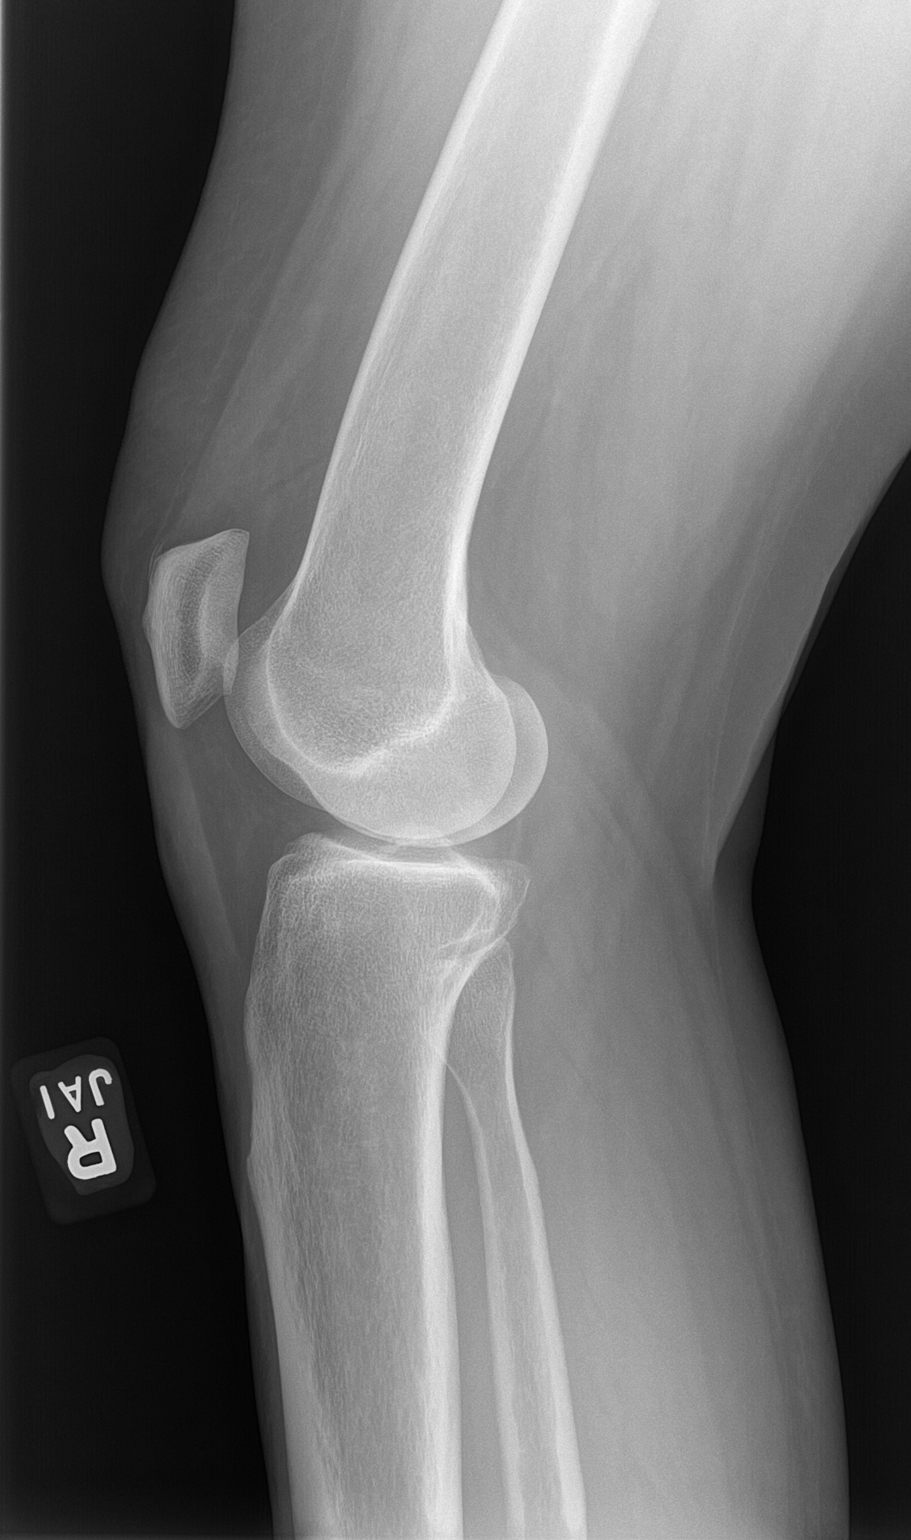

[patella]
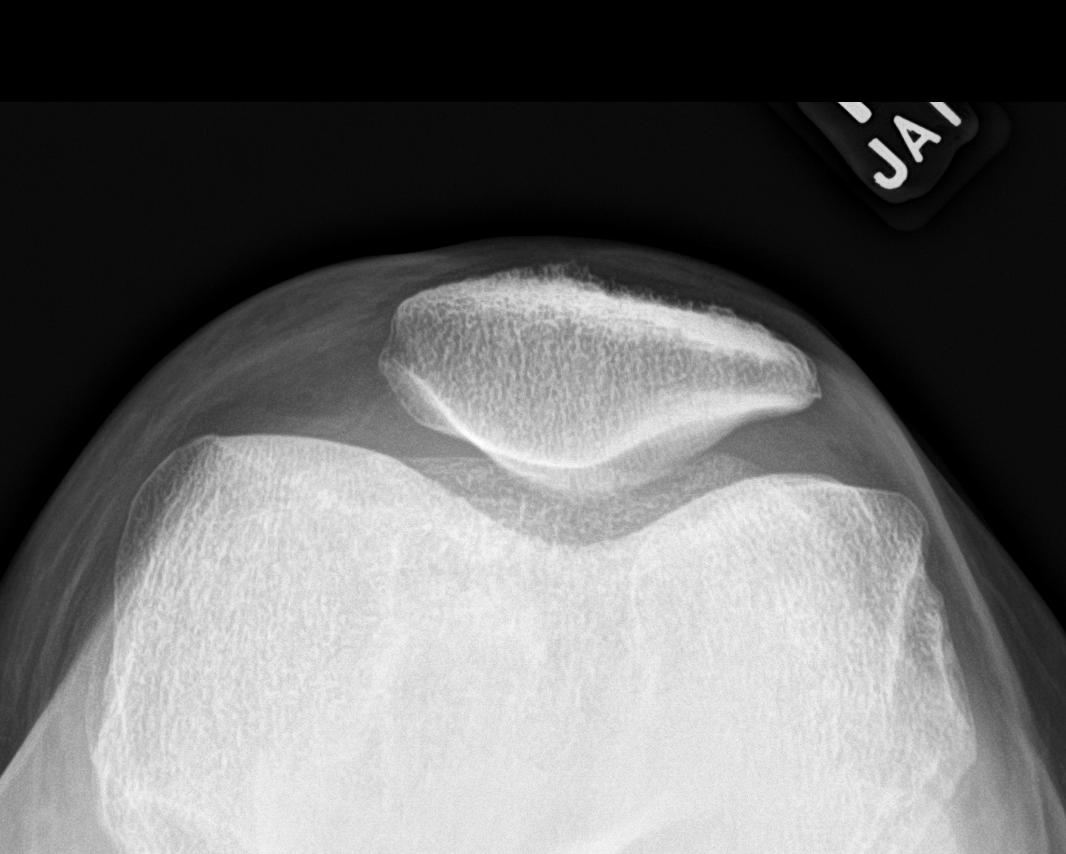

[3 of 3 positions shown; findings below may reference images not displayed]

FINDINGS: Mild medial compartment joint space narrowing. Minimal enthesopathic
spurring at the quadriceps insertion on the patella. No joint
effusion. No acute fracture or dislocation.
IMPRESSION: Mild medial compartment joint space narrowing.

## 2023-07-04 ENCOUNTER — Ambulatory Visit (HOSPITAL_COMMUNITY): Payer: Medicare HMO

## 2023-07-09 ENCOUNTER — Ambulatory Visit: Payer: Medicare HMO | Admitting: Allergy & Immunology

## 2023-07-09 ENCOUNTER — Ambulatory Visit (HOSPITAL_COMMUNITY): Payer: Medicare HMO

## 2023-07-11 ENCOUNTER — Ambulatory Visit (HOSPITAL_COMMUNITY): Payer: Medicare HMO

## 2023-07-16 ENCOUNTER — Ambulatory Visit (INDEPENDENT_AMBULATORY_CARE_PROVIDER_SITE_OTHER): Payer: Medicare HMO

## 2023-07-16 ENCOUNTER — Ambulatory Visit (HOSPITAL_COMMUNITY): Payer: Medicare HMO

## 2023-07-16 ENCOUNTER — Ambulatory Visit: Payer: Medicare HMO

## 2023-07-16 DIAGNOSIS — J4550 Severe persistent asthma, uncomplicated: Secondary | ICD-10-CM

## 2023-07-18 ENCOUNTER — Ambulatory Visit (HOSPITAL_COMMUNITY): Payer: Medicare HMO

## 2023-07-20 DIAGNOSIS — J449 Chronic obstructive pulmonary disease, unspecified: Secondary | ICD-10-CM | POA: Diagnosis not present

## 2023-07-20 DIAGNOSIS — J9610 Chronic respiratory failure, unspecified whether with hypoxia or hypercapnia: Secondary | ICD-10-CM | POA: Diagnosis not present

## 2023-07-22 DIAGNOSIS — H5203 Hypermetropia, bilateral: Secondary | ICD-10-CM | POA: Diagnosis not present

## 2023-07-22 DIAGNOSIS — H25813 Combined forms of age-related cataract, bilateral: Secondary | ICD-10-CM | POA: Diagnosis not present

## 2023-07-22 DIAGNOSIS — H52203 Unspecified astigmatism, bilateral: Secondary | ICD-10-CM | POA: Diagnosis not present

## 2023-07-22 DIAGNOSIS — E119 Type 2 diabetes mellitus without complications: Secondary | ICD-10-CM | POA: Diagnosis not present

## 2023-07-22 LAB — HM DIABETES EYE EXAM

## 2023-07-23 ENCOUNTER — Ambulatory Visit (HOSPITAL_COMMUNITY): Payer: Medicare HMO

## 2023-07-23 NOTE — Patient Instructions (Incomplete)
1. Asthma-COPD overlap syndrome- not well controlled Stop Ventolin (albuterol) and use Airsupra as prescribed by Dr. Jonny Ruiz on 06/18/23. Patient verbally instructed on Qvar 80 mcg not being sent in to her pharmacy as discussed in office today for asthma flares since she has Airsupra. She reports that she just picked up the Airsupra today after the appointment. Continue to follow up with your pulmnologist, Dr. Delton Coombes. Keep your appointment with ENT in September for possible vocal cord dysfunction - Daily controller medication(s): Dupixent every two weeks and Breztri two puffs twice daily - Prior to physical activity: Ventolin (GRAY INHALER) 2 puffs 10-15 minutes before physical activity. - Rescue medications: Ventolin (GRAY INHALER) 2 puffs AND 2 puffs Atrovent (GREEN INHALER) every 4-6 hours as needed  - Asthma control goals:  * Full participation in all desired activities (may need albuterol before activity) * Albuterol use two time or less a week on average (not counting use with activity) * Cough interfering with sleep two time or less a month * Oral steroids no more than once a year * No hospitalizations   2. 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  - Start famotidine twice daily AS NEEDED.  - Continue with Doxepin 25mg  at night to help with itching AS NEEDED.   3. Itching - Continue hydroxyzine 50mg  every night to see if this can help with itching. Caution as this may make you drowsy - Start Xyzal to one tablet twice daily.   4. 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 - Continue with allergy shots at the same schedule (come back next week so you start your allergy shots again).   Today while in the office your blood  pressure was elevated.  Please schedule an appointment with your primary care physician to discuss your elevated blood pressure 5. Follow up in 2 weeks with Dr. Dellis Anes or earlier if needed.

## 2023-07-24 ENCOUNTER — Ambulatory Visit (INDEPENDENT_AMBULATORY_CARE_PROVIDER_SITE_OTHER): Payer: Medicare HMO | Admitting: Family

## 2023-07-24 ENCOUNTER — Other Ambulatory Visit (HOSPITAL_COMMUNITY): Payer: Self-pay | Admitting: Psychiatry

## 2023-07-24 ENCOUNTER — Other Ambulatory Visit: Payer: Self-pay

## 2023-07-24 ENCOUNTER — Encounter: Payer: Self-pay | Admitting: Family

## 2023-07-24 ENCOUNTER — Telehealth: Payer: Self-pay

## 2023-07-24 VITALS — BP 140/80 | HR 98 | Temp 98.3°F | Resp 16 | Wt 187.7 lb

## 2023-07-24 DIAGNOSIS — J4489 Other specified chronic obstructive pulmonary disease: Secondary | ICD-10-CM | POA: Diagnosis not present

## 2023-07-24 DIAGNOSIS — J3089 Other allergic rhinitis: Secondary | ICD-10-CM | POA: Diagnosis not present

## 2023-07-24 DIAGNOSIS — L508 Other urticaria: Secondary | ICD-10-CM | POA: Diagnosis not present

## 2023-07-24 NOTE — Progress Notes (Signed)
[address] [place] [address] Dept: [phone]  FOLLOW UP NOTE  Patient ID: [name], female    DOB: 10-17-1961  Age: 62 y.o. MRN: [id number] Date of Office Visit: 07/24/2023  Assessment  Chief Complaint: Angioedema (Eyes and feet)  HPI [name] is a 62 year old female who presents today for acute visit of swelling in the eyes and feet and rash.  She was last seen on April 16, 2023 by Dr. Dellis Anes for asthma COPD overlap syndrome, chronic urticaria, itching, mixed rhinitis with ear fullness and congestion (dust mites), recurrent admissions for breathing issues with hypercapnia and hypoxia-thought to be secondary to untreated obstructive sleep apnea (now on BiPAP with good results), CT chest concerning for COPD-alpha antitrypsin 131 (and normal over the years), recurrent infections with largely normal workup in 2020 and then lackluster streptococcal titers in November 2023, and complicated past medical history including disability as well as bipolar 1 depression and vitamin D deficiency.  Chronic urticaria: She reports that she is not taking levocetirizine twice a day as prescribed at last office visit or famotidine twice a day as prescribed at last office visit.  She thinks that she is taking doxepin at night to help with the itching, but is not certain.  She does mention that she is taking hydroxyzine 50 mg at night.  She reports that she has had off and on swelling of her feet and eyes since July, but then later on mentions that she has been having off-and-on swelling with hives for a long time now.  Today she does not have any hives or swelling.  She does have pictures of her swollen feet on her phone.  She does use triamcinolone cream that she has been prescribed previously and this helps.  She reports that when she has the hives that they are itchy and also painful.  She reports that they leave dark spots when they go away.  She is not able to  correlate the rashes with any foods.  She is not taking any new medications or using any new products.  She denies any recent insect bites.  She is not sick prior to the hives occurring.  She denies fever or chills.  She did have lab work on July 10, 2022 showing a normal tryptase and normal CRP, ANA, and sed rate.  She is never had a biopsy of her hives.  She was previously on Xolair for hives from 2017-2020.  Asthma-COPD overlap syndrome: She continues to receive Dupixent injection every 2 weeks and takes Breztri 2 puffs twice a day.  Since her last office visit she has been to the emergency room twice for respiratory issues.  She reports that her breathing is doing better right now, but she is still using her albuterol inhaler a lot.  She does have an appointment with ENT in September for possible vocal cord dysfunction.  She reports shortness of breath with exertion and denies cough, wheeze, and tightness in chest.  She reports nocturnal awakenings due to breathing problems sometimes depending on her acid reflux.  She does continue to wear a BiPAP at night.  She reports that she has been on 1 round of steroids since her last office visit.  She does continue to see Dr. Delton Coombes, her pulmonologist.  She reports that she is a previous smoker and quit smoking 3 to 4 years ago.  She has had lab work looking for causes of severe asthma that were unrevealing in January 2020.  Drug Allergies:  Allergies  Allergen Reactions   Sulfa Antibiotics Nausea And Vomiting and Other (See Comments)   Venofer [Iron Sucrose] Hives    Review of Systems: Negative except as per HPI   Physical Exam: BP (!) 140/80   Pulse 98   Temp 98.3 F (36.8 C) (Temporal)   Resp 16   Wt 187 lb 11.2 oz (85.1 kg)   SpO2 92%   BMI 33.25 kg/m    Physical Exam Constitutional:      Appearance: Normal appearance.  HENT:     Head: Normocephalic and atraumatic.     Comments: Pharynx normal, eyes normal, ears normal, nose normal     Right Ear: Tympanic membrane, ear canal and external ear normal.     Left Ear: Tympanic membrane, ear canal and external ear normal.     Nose: Nose normal.     Mouth/Throat:     Mouth: Mucous membranes are moist.     Pharynx: Oropharynx is clear.  Eyes:     Conjunctiva/sclera: Conjunctivae normal.  Cardiovascular:     Rate and Rhythm: Regular rhythm.     Heart sounds: Normal heart sounds.  Pulmonary:     Effort: Pulmonary effort is normal.     Comments: Diminished breath sounds throughout Musculoskeletal:     Cervical back: Neck supple.  Skin:    General: Skin is warm.     Comments: No swelling noted on bilateral lower extremities/feet and eyes.  Hyperpigmented areas noted on bilateral sides of neck  Neurological:     Mental Status: She is alert and oriented to person, place, and time.  Psychiatric:        Mood and Affect: Mood normal.        Behavior: Behavior normal.        Thought Content: Thought content normal.        Judgment: Judgment normal.     Diagnostics: FVC 1.79 L (69%), FEV1 0.84 L (40%).  Spirometry indicates severe airway obstruction.  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  Assessment and Plan: 1. Chronic urticaria   2. Asthma-COPD overlap syndrome   3. Perennial allergic rhinitis     No orders of the defined types were placed in this encounter.   Patient Instructions  1. Asthma-COPD overlap syndrome- not well controlled Stop Ventolin (albuterol) and use Airsupra as prescribed by Dr. Jonny Ruiz on 06/18/23. Patient verbally instructed on Qvar 80 mcg not being sent in to her pharmacy as discussed in office today for asthma flares since she has Airsupra. She reports that she just picked up the Airsupra today after the appointment. Continue to follow up with your pulmnologist, Dr. Delton Coombes. Keep your appointment with ENT in September for possible vocal cord dysfunction - Daily controller medication(s): Dupixent every two weeks and Breztri two puffs twice daily - Prior to physical  activity: Ventolin (GRAY INHALER) 2 puffs 10-15 minutes before physical activity. - Rescue medications: Ventolin (GRAY INHALER) 2 puffs AND 2 puffs Atrovent (GREEN INHALER) every 4-6 hours as needed  - Asthma control goals:  * Full participation in all desired activities (may need albuterol before activity) * Albuterol use two time or less a week on average (not counting use with activity) * Cough interfering with sleep two time or less a month * Oral steroids no more than once a year * No hospitalizations   2. Chronic urticaria -previously on Xolair- not controlled - I will refer her to dermatology for biopsy of hyperpigmented lesion on neck due to possible urticarial  vasculitis - Start levocetirizine twice daily EVERY DAY FOR NOW.  - Start famotidine twice daily AS NEEDED.  - Continue with Doxepin 25mg  at night to help with itching AS NEEDED.   3. Itching - Continue hydroxyzine 50mg  every night to see if this can help with itching. Caution as this may make you drowsy - Start Xyzal to one tablet twice daily.   4. 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 - Continue with allergy shots at the same schedule (come back next week so you start your allergy shots again).   Today while in the office your blood pressure was elevated.  Please schedule an appointment with your primary care physician to discuss your elevated blood pressure 5. Follow up in 2 weeks with Dr. Dellis Anes or earlier if needed.     Return in about 2 weeks (around 08/07/2023), or if symptoms worsen or fail to improve.    Thank you for the opportunity to care for this patient.  Please do not hesitate to contact me with questions.  Nehemiah Settle, FNP [facility]

## 2023-07-24 NOTE — Telephone Encounter (Signed)
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

## 2023-07-25 ENCOUNTER — Ambulatory Visit (HOSPITAL_COMMUNITY): Payer: Medicare HMO

## 2023-07-26 ENCOUNTER — Ambulatory Visit: Payer: Self-pay

## 2023-07-26 NOTE — Patient Instructions (Signed)
Visit Information  Thank you for taking time to visit with me today. Please don't hesitate to contact me if I can be of assistance to you.   Following are the goals we discussed today:  Attend provider visits as scheduled Take medications as prescribed Contact provider with any health questions or concerns as needed   Our next appointment is by telephone on 09/10/23 at 9:30 am  Please call the care guide team at [phone] if you need to cancel or reschedule your appointment.   If you are experiencing a Mental Health or Behavioral Health Crisis or need someone to talk to, please call the Suicide and Crisis Lifeline: 988 call the [place] National Suicide Prevention Lifeline: 5592465586 or TTY: 929-815-2661 TTY (903) 618-3587) to talk to a trained counselor call 1-800-273-TALK (toll free, 24 hour hotline)  Kathyrn Sheriff, RN, MSN, BSN, CCM [facility] Care Coordinator [phone]

## 2023-07-26 NOTE — Patient Outreach (Signed)
  Care Coordination   Follow Up Visit Note   07/26/2023 Name: [name] MRN: [id number] DOB: 29-Sep-1961  [name] is a 62 y.o. year old female who sees Felicia Levins, MD for primary care. I spoke with  [name] by phone today.  What matters to the patients health and wellness today?  She expresses she is using Bipap nightly. Last pulmonary office visit completed on 05/06/23 and encouraged patient to call to schedule follow up appointment as recommended Encouraged to continue to montior BP and notify provider if outside recommended parameters. [name] voiced frustration with the number of provider visits. She reports has been under a lot of stress lately, but states, her stress has decreased since obtaining a car. She declines LCSW referral and states she is active with a Veterinary surgeon.    Goals Addressed             This Visit's Progress    COMPLETED: care coordination activities       Interventions Today    Flowsheet Row Most Recent Value  General Interventions   General Interventions Discussed/Reviewed --  [referral to care guide to reschedule telephone call]           Support with Health managment education       Interventions Today    Flowsheet Row Most Recent Value  Chronic Disease   Chronic disease during today's visit Other, Chronic Obstructive Pulmonary Disease (COPD)  General Interventions   General Interventions Discussed/Reviewed General Interventions Reviewed, Doctor Visits  Doctor Visits Discussed/Reviewed Doctor Visits Discussed, PCP, Specialist  PCP/Specialist Visits Compliance with follow-up visit  [reviewed upcoming appointments and advised to attend as recommended/scheduled]  Education Interventions   Education Provided Provided Education  Provided Verbal Education On Other, Medication, When to see the doctor  [reviewed patient instructions from office visit 07/24/23 allergist, encouraged to call to schedule  follow up visit with pulmonologist, advised to continue to keep track of blood pressure and notify primary provider with health questions/concerns.]  Mental Health Interventions   Mental Health Discussed/Reviewed Mental Health Discussed  [active listening, encouraged to continue to work with therapist]  Pharmacy Interventions   Pharmacy Dicussed/Reviewed Pharmacy Topics Reviewed           SDOH assessments and interventions completed:  Yes  SDOH Interventions Today    Flowsheet Row Most Recent Value  SDOH Interventions   Food Insecurity Interventions Intervention Not Indicated  Housing Interventions Intervention Not Indicated  Transportation Interventions Intervention Not Indicated  Utilities Interventions Intervention Not Indicated     Care Coordination Interventions:  Yes, provided   Follow up plan: Follow up call scheduled for 09/10/23    Encounter Outcome:  Pt. Visit Completed   Felicia Sheriff, RN, MSN, BSN, CCM [facility] Care Coordinator [phone]

## 2023-07-30 ENCOUNTER — Ambulatory Visit (INDEPENDENT_AMBULATORY_CARE_PROVIDER_SITE_OTHER): Payer: Medicare HMO | Admitting: *Deleted

## 2023-07-30 ENCOUNTER — Ambulatory Visit (HOSPITAL_COMMUNITY): Payer: Medicare HMO

## 2023-07-30 DIAGNOSIS — J4550 Severe persistent asthma, uncomplicated: Secondary | ICD-10-CM | POA: Diagnosis not present

## 2023-07-31 NOTE — Telephone Encounter (Signed)
 Referral has been placed to [facility].

## 2023-08-01 ENCOUNTER — Ambulatory Visit (HOSPITAL_COMMUNITY): Payer: Medicare HMO

## 2023-08-01 DIAGNOSIS — J3089 Other allergic rhinitis: Secondary | ICD-10-CM | POA: Diagnosis not present

## 2023-08-01 NOTE — Progress Notes (Signed)
VIALS EXP 07-31-24

## 2023-08-02 ENCOUNTER — Telehealth: Payer: Self-pay

## 2023-08-02 NOTE — Telephone Encounter (Signed)
*  Primary  Pharmacy Patient Advocate Encounter   Received notification from CoverMyMeds that prior authorization for Airsupra 90-80MCG/ACT aerosol  is required/requested.   Insurance verification completed.   The patient is insured through [employer] .   Per test claim: PA required; PA submitted to [facility] via CoverMyMeds Key/confirmation #/EOC [id number] Status is pending

## 2023-08-03 NOTE — Telephone Encounter (Signed)
Thank you :)

## 2023-08-05 ENCOUNTER — Emergency Department (HOSPITAL_COMMUNITY): Payer: Medicare HMO

## 2023-08-05 ENCOUNTER — Other Ambulatory Visit: Payer: Self-pay

## 2023-08-05 ENCOUNTER — Ambulatory Visit: Payer: Medicare HMO | Admitting: Internal Medicine

## 2023-08-05 ENCOUNTER — Encounter (HOSPITAL_COMMUNITY): Payer: Self-pay

## 2023-08-05 ENCOUNTER — Emergency Department (HOSPITAL_COMMUNITY)
Admission: EM | Admit: 2023-08-05 | Discharge: 2023-08-05 | Disposition: A | Discharge status: Home / Self Care | Payer: Medicare HMO | Source: Home / Self Care | Attending: Emergency Medicine | Admitting: Emergency Medicine

## 2023-08-05 ENCOUNTER — Telehealth: Payer: Self-pay | Admitting: Allergy & Immunology

## 2023-08-05 DIAGNOSIS — J449 Chronic obstructive pulmonary disease, unspecified: Secondary | ICD-10-CM | POA: Diagnosis not present

## 2023-08-05 DIAGNOSIS — R0600 Dyspnea, unspecified: Secondary | ICD-10-CM | POA: Diagnosis not present

## 2023-08-05 DIAGNOSIS — I10 Essential (primary) hypertension: Secondary | ICD-10-CM | POA: Insufficient documentation

## 2023-08-05 DIAGNOSIS — Z8673 Personal history of transient ischemic attack (TIA), and cerebral infarction without residual deficits: Secondary | ICD-10-CM | POA: Insufficient documentation

## 2023-08-05 DIAGNOSIS — R079 Chest pain, unspecified: Secondary | ICD-10-CM | POA: Diagnosis not present

## 2023-08-05 DIAGNOSIS — R457 State of emotional shock and stress, unspecified: Secondary | ICD-10-CM | POA: Diagnosis not present

## 2023-08-05 DIAGNOSIS — J9811 Atelectasis: Secondary | ICD-10-CM | POA: Diagnosis not present

## 2023-08-05 DIAGNOSIS — R0689 Other abnormalities of breathing: Secondary | ICD-10-CM | POA: Diagnosis not present

## 2023-08-05 DIAGNOSIS — R0602 Shortness of breath: Secondary | ICD-10-CM | POA: Diagnosis not present

## 2023-08-05 LAB — CBC
HCT: 36.2 % (ref 36.0–46.0)
Hemoglobin: 11.2 g/dL — ABNORMAL LOW (ref 12.0–15.0)
MCH: 26.9 pg (ref 26.0–34.0)
MCHC: 30.9 g/dL (ref 30.0–36.0)
MCV: 86.8 fL (ref 80.0–100.0)
Platelets: 434 10*3/uL — ABNORMAL HIGH (ref 150–400)
RBC: 4.17 MIL/uL (ref 3.87–5.11)
RDW: 15 % (ref 11.5–15.5)
WBC: 11.2 10*3/uL — ABNORMAL HIGH (ref 4.0–10.5)
nRBC: 0 % (ref 0.0–0.2)

## 2023-08-05 LAB — BASIC METABOLIC PANEL
Anion gap: 12 (ref 5–15)
BUN: <5 mg/dL — ABNORMAL LOW (ref 6–20)
CO2: 24 mmol/L (ref 22–32)
Calcium: 9.1 mg/dL (ref 8.9–10.3)
Chloride: 102 mmol/L (ref 98–111)
Creatinine, Ser: 0.8 mg/dL (ref 0.44–1.00)
GFR, Estimated: >60 mL/min (ref >60)
Glucose, Bld: 113 mg/dL — ABNORMAL HIGH (ref 70–99)
Potassium: 3.5 mmol/L (ref 3.5–5.1)
Sodium: 138 mmol/L (ref 135–145)

## 2023-08-05 LAB — TROPONIN I (HIGH SENSITIVITY)
Troponin I (High Sensitivity): 5 ng/L (ref <18)
Troponin I (High Sensitivity): 6 ng/L (ref <18)

## 2023-08-05 LAB — D-DIMER, QUANTITATIVE: D-Dimer, Quant: 0.55 ug{FEU}/mL — ABNORMAL HIGH (ref 0.00–0.50)

## 2023-08-05 LAB — BRAIN NATRIURETIC PEPTIDE: B Natriuretic Peptide: 26.8 pg/mL (ref 0.0–100.0)

## 2023-08-05 MED ORDER — KETOROLAC TROMETHAMINE 15 MG/ML IJ SOLN
15.0000 mg | Freq: Once | INTRAMUSCULAR | Status: AC
  Administered 2023-08-05: 15 mg via INTRAMUSCULAR
  Filled 2023-08-05: qty 1

## 2023-08-05 MED ORDER — LORAZEPAM 1 MG PO TABS
1.0000 mg | ORAL_TABLET | Freq: Once | ORAL | Status: AC
  Administered 2023-08-05: 1 mg via ORAL
  Filled 2023-08-05: qty 1

## 2023-08-05 MED ORDER — GABAPENTIN 100 MG PO CAPS
100.0000 mg | ORAL_CAPSULE | Freq: Once | ORAL | Status: AC
  Administered 2023-08-05: 100 mg via ORAL
  Filled 2023-08-05: qty 1

## 2023-08-05 MED ORDER — ACETAMINOPHEN 500 MG PO TABS: 1000.0000 mg | ORAL_TABLET | Freq: Once | ORAL | Status: DC

## 2023-08-05 NOTE — Discharge Instructions (Addendum)
You were seen today for shortness of breath and anxiety. While you were here we monitored your vitals, preformed a physical exam, and an EKG, checked lab work and imaging. These were all reassuring and there is no indication for any further testing or intervention in the emergency department at this time.   Things to do:  - Follow up with your primary care provider within the next 1-2 weeks  Return to the emergency department if you have any new or worsening symptoms including severe chest pain, worsening shortness of breath, fevers, or if you have any other concerns.

## 2023-08-05 NOTE — Telephone Encounter (Signed)
Patient had scheduled appointment for today due to trouble breathing. Patient called back and cancelled appointment. She is at the hospital now.

## 2023-08-05 NOTE — ED Triage Notes (Signed)
Pt arrived via [facility] from home for SOB and [id number]. Per EMS, pt was having a panic attack when they arrived. Pt initial hr 126 and resp 26, but then they got her to slow her breathing down and hr and resp went back to normal.

## 2023-08-05 NOTE — ED Provider Notes (Signed)
MSE note.  Patient has a history of COPD and also vocal cord dysfunction.  Patient complains of some shortness of breath.  Patient vital signs are unremarkable except for pulse of 100.  Physical exam patient having minimal expiratory wheezing.  She is very anxious.  Heart regular rate and rhythm.  Abdomen negative.  Labs and x-rays ordered.  Patient is also ordered 1 mg of Ativan p.o.   Bethann Berkshire, MD 08/05/23 1026

## 2023-08-05 NOTE — ED Provider Notes (Signed)
[place] EMERGENCY DEPARTMENT AT [facility] Provider Note  MDM   HPI/ROS:  [name] is a 62 y.o. female with COPD and vocal cord dysfunction presenting with chief complaint of shortness of breath.  Patient has been recently treated for COPD exacerbation in the last week, and has recently completed a course of steroids.  She felt that that improved her breathing significantly, but woke up this morning and got out of the shower and felt immediate onset shortness of breath.  She felt as if she was "gasping for air."  Patient also endorses subjective fevers and chills over the past few days, but denies increased productiveness of her cough.  She is not taking any blood thinners at this time, and endorses that both of her legs were swollen last week.  Denies history of heart conditions.  Per EMS note, she was apparently extremely anxious on transport and was tachycardic and hypertensive.  She was given a dose of Ativan prior to my evaluation of her.  Upon initial evaluation, patient is still tachycardic with rates in the low 100s.  Physical exam is notable for: - Overall well-appearing, no acute distress - Cardiopulmonary exam benign.  No significant wheezes, rhonchi, rales on pulmonary auscultation.  No stridors - Abdomen soft, nontender, nondistended - No lower extremity edema  On my initial evaluation, patient is:  -Vital signs stable. Patient afebrile, hemodynamically stable, and non-toxic appearing. -Additional history obtained from chart review  Given the patient's history and physical exam, differential diagnosis includes but is not limited to COPD exacerbation, pulmonary embolism, pneumonia, CHF exacerbation, etc.  Interpretations, interventions, and the patient's course of care are documented below.    Workup obtained in triage includes EKG, CBC, BMP, troponin, chest x-ray.  Given reports of leg swelling with tachycardia and sudden onset dyspnea, D-dimer ordered to  rule out PE.  D-dimer resulted within acceptable limits.  Low concern for PE at this time per years criteria.  Upon reassessment, patient resting comfortably with no new symptoms.  Complaining of pain in her hips and back.  Administered Toradol and gabapentin.  Given that workup is unremarkable, vital signs remained stable, and patient has experienced no new symptoms, deemed safe and stable for discharge at this time.  Strict turn precautions discussed and instructed to follow-up with primary care provider.  Disposition:  I discussed the plan for discharge with the patient and/or their surrogate at bedside prior to discharge and they were in agreement with the plan and verbalized understanding of the return precautions provided. All questions answered to the best of my ability. Ultimately, the patient was discharged in stable condition with stable vital signs. I am reassured that they are capable of close follow up and good social support at home.   Clinical Impression:  1. Dyspnea, unspecified type     Rx / DC Orders ED Discharge Orders     None       The plan for this patient was discussed with Dr. Suezanne Jacquet, who voiced agreement and who oversaw evaluation and treatment of this patient.   Clinical Complexity A medically appropriate history, review of systems, and physical exam was performed.  My independent interpretations of EKG, labs, and radiology are documented in the ED course above.   Click here for ABCD2, HEART and other calculatorsREFRESH Note before signing   Patient's presentation is most consistent with acute complicated illness / injury requiring diagnostic workup.  Medical Decision Making Amount and/or Complexity of Data Reviewed Labs: ordered. Radiology: ordered.  Risk Prescription drug management.    HPI/ROS      See MDM section for pertinent HPI and ROS. A complete ROS was performed with pertinent positives/negatives noted above.   Past Medical History:   Diagnosis Date   Anemia, iron deficiency 12/22/2014   pt. 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      Bipolar disorder (HCC)    with anxiety, depression   COPD (chronic obstructive pulmonary disease) (HCC)    Eczema 05/18/2022   Fibromyalgia 05/14/2014   GERD (gastroesophageal reflux disease)    History of kidney stones    Hyperlipidemia 04/20/2017   HYPERTENSION 05/12/2009   Qualifier: Diagnosis of  By: Truman Hayward Duncan Dull), [name]     Peripheral vascular disease (HCC)    Pneumonia    Prediabetes 02/23/2014   pt. denies   Seizure (HCC)    Stroke (HCC) 11/2020   Urticaria     Past Surgical History:  Procedure Laterality Date   ABDOMINAL HYSTERECTOMY     ANTERIOR CERVICAL DECOMP/DISCECTOMY FUSION N/A 07/28/2020   Procedure: ANTERIOR CERVICAL DECOMPRESSION/DISCECTOMY FUSION. INTERBODY PROTHESIS, PLATE/SCREWS CERVICAL THREE- CERVICAL FOUR, CERVICAL FOUR- CERVICAL FIVE;  Surgeon: Tressie Stalker, MD;  Location: [facility] OR;  Service: Neurosurgery;  Laterality: N/A;   BACK SURGERY     COLONOSCOPY  12/20/2011   Procedure: COLONOSCOPY;  Surgeon: Freddy Jaksch, MD;  Location: [facility];  Service: Endoscopy;  Laterality: N/A;   COLONOSCOPY  03/05/2012   Procedure: COLONOSCOPY;  Surgeon: Freddy Jaksch, MD;  Location: [facility];  Service: Endoscopy;  Laterality: N/A;   DIAGNOSTIC LAPAROSCOPY     HEMORRHOID SURGERY     INCISE AND DRAIN ABCESS     KIDNEY STONE SURGERY     NECK SURGERY     x 2 Dr Terrilee Files   SPINE SURGERY  2019   TOE SURGERY     TUBAL LIGATION        Physical Exam   Vitals:   08/05/23 1011 08/05/23 1014 08/05/23 1130 08/05/23 1549  BP: (!) 143/78  (!) 152/119 (!) 157/97  Pulse: 100  (!) 106 92  Resp: 16  20 18   Temp: 99 F (37.2 C)  99 F (37.2 C) 98.5 F (36.9 C)   TempSrc: Oral  Oral Oral  SpO2: 94%  97% 100%  Weight:  85.1 kg    Height:  5\' 3"  (1.6 m)      Physical Exam Vitals and nursing note reviewed.  Constitutional:      General: She is not in acute distress.    Appearance: She is well-developed.  HENT:     Head: Normocephalic and atraumatic.  Eyes:     Conjunctiva/sclera: Conjunctivae normal.  Cardiovascular:     Rate and Rhythm: Normal rate and regular rhythm.     Heart sounds: No murmur heard. Pulmonary:     Effort: Pulmonary effort is normal. No respiratory distress.     Breath sounds: Normal breath sounds. No decreased breath sounds or wheezing.  Abdominal:     Palpations: Abdomen is soft.     Tenderness: There is no abdominal tenderness.  Musculoskeletal:        General: No swelling.     Cervical back: Neck supple.  Skin:    General: Skin is warm and dry.  Capillary Refill: Capillary refill takes less than 2 seconds.  Neurological:     Mental Status: She is alert.  Psychiatric:        Mood and Affect: Mood normal.    Starleen Arms, MD Department of Emergency Medicine   Please note that this documentation was produced with the assistance of voice-to-text technology and may contain errors.    Dyanne Iha, MD 08/05/23 1811    Lonell Grandchild, MD 08/05/23 1910

## 2023-08-06 ENCOUNTER — Ambulatory Visit (HOSPITAL_COMMUNITY): Payer: Medicare HMO

## 2023-08-06 ENCOUNTER — Encounter: Payer: Self-pay | Admitting: Allergy & Immunology

## 2023-08-06 NOTE — Telephone Encounter (Signed)
Poor thing -  can we call her and see how she is feeling?   Malachi Bonds, MD [facility]

## 2023-08-06 NOTE — Telephone Encounter (Signed)
Spoke to patient and she is doing better today. Still having some issues with breathing when she is actively doing things.

## 2023-08-07 ENCOUNTER — Encounter: Payer: Self-pay | Admitting: Nurse Practitioner

## 2023-08-07 ENCOUNTER — Telehealth (INDEPENDENT_AMBULATORY_CARE_PROVIDER_SITE_OTHER): Payer: Medicare HMO | Admitting: Nurse Practitioner

## 2023-08-07 ENCOUNTER — Encounter: Payer: Self-pay | Admitting: Internal Medicine

## 2023-08-07 VITALS — BP 113/93

## 2023-08-07 DIAGNOSIS — U071 COVID-19: Secondary | ICD-10-CM | POA: Diagnosis not present

## 2023-08-07 MED ORDER — AZITHROMYCIN 250 MG PO TABS: ORAL_TABLET | ORAL | 0 refills | Status: DC

## 2023-08-07 MED ORDER — NIRMATRELVIR/RITONAVIR (PAXLOVID)TABLET: 3.0000 | ORAL_TABLET | Freq: Two times a day (BID) | ORAL | 0 refills | Status: DC

## 2023-08-07 MED ORDER — PREDNISONE 20 MG PO TABS
40.0000 mg | ORAL_TABLET | Freq: Every day | ORAL | 0 refills | Status: DC
Start: 2023-08-07 — End: 2023-08-20

## 2023-08-07 NOTE — Patient Instructions (Signed)
It was great to see you!  Start prednisone 2 tablets daily for 5 days.  Take this in the morning with food.  Start azithromycin 2 tablets today and then 1 tablet daily until gone.  Drink plenty of fluids and get rest  Let's follow-up if your symptoms worsen or do not improve in the next 2 to 3 days.  Please schedule this in person or go to urgent care.  Take care,  Rodman Pickle, NP

## 2023-08-07 NOTE — Progress Notes (Signed)
[facility] [address] [place] [address] Dept: [phone] Dept Fax: [phone]  Virtual Video Visit  I connected with [name] on 08/07/23 at  4:20 PM EDT by a video enabled telemedicine application and verified that I am speaking with the correct person using two identifiers.  Location patient: Home Location provider: Clinic Persons participating in the virtual visit: Patient; Rodman Pickle, NP; Malena Peer, CMA  I discussed the limitations of evaluation and management by telemedicine and the availability of in person appointments. The patient expressed understanding and agreed to proceed.  Chief Complaint  Patient presents with   Covid Positive    SUBJECTIVE:  HPI: [name] is a 62 y.o. female who presents with runny nose, diarrhea, and fatigue since Friday. She went to the ER on 08/05/23 for shortness of breath and chest pain. Her vital signs and labs were stable. She had a negative covid test there. However, started feeling worse and took a covid test today and it was positive.   UPPER RESPIRATORY TRACT INFECTION  Fever: yes Cough: yes - productive, green Shortness of breath: yes Wheezing: yes Chest pain: yes, with cough Chest tightness:  at times Chest congestion: no Nasal congestion: yes Runny nose: yes Post nasal drip: yes Sneezing: no Sore throat: yes Swollen glands: no Sinus pressure: no Headache: yes Face pain: no Toothache: no Ear pain: yes bilateral Ear pressure: no bilateral Eyes red/itching:no Eye drainage/crusting: no  Vomiting: yes Rash: no Fatigue: yes Sick contacts: yes - grandson Strep contacts: no  Context: worse Recurrent sinusitis: no Relief with OTC cold/cough medications: no  Treatments attempted: regular medicines    Patient Active Problem List   Diagnosis Date Noted   COVID-19 08/07/2023   Acute respiratory failure  with hypoxia and hypercapnia (HCC) 06/23/2023   Peripheral edema 06/18/2023   Cirrhosis (HCC) 03/07/2023   Vocal cord dysfunction 03/05/2023   Urinary incontinence 01/25/2023   Influenza A 01/11/2023   Bilateral impacted cerumen 12/04/2022   Not well controlled severe persistent asthma 10/25/2022   Acute on chronic respiratory failure with hypoxemia (HCC) 10/07/2022   Abnormal TSH 09/13/2022   Chronic sinusitis 08/14/2022   PND (paroxysmal nocturnal dyspnea) 06/07/2022   Vitamin D deficiency 05/20/2022   Eczema 05/18/2022   Insomnia 05/18/2022   Allergic conjunctivitis 05/18/2022   Chronic respiratory failure with hypoxia and hypercapnia (HCC) 04/30/2022   Bilateral hearing loss 04/14/2022   Moderate persistent asthma without complication 02/02/2022   Low back pain 10/11/2021   Abnormal CXR 07/17/2021   Aortic atherosclerosis (HCC) 07/05/2021   URI (upper respiratory infection) 07/05/2021   Arthrodesis status 06/20/2021   Drug allergy 05/14/2021   Acute cough 05/14/2021   Bilateral leg weakness 04/17/2021   Status post cervical spinal fusion 12/27/2020   History of stroke 12/11/2020   Medication management 10/21/2020   Body mass index (BMI) 26.0-26.9, adult 08/23/2020   Healthcare maintenance 08/11/2020   Cervical spondylosis with myelopathy and radiculopathy 07/28/2020   Preop exam for internal medicine 06/19/2020   Hypotension due to medication 06/11/2020   Essential (primary) hypertension 05/17/2020   Other spondylosis with myelopathy, cervical region 02/16/2020   Cervical spondylosis 02/02/2020   Radiculopathy, cervical region 02/02/2020   Spondylolysis, cervical region 02/02/2020   Neck pain 02/02/2020   Leg cramping 08/12/2019   Laryngopharyngeal reflux (LPR) 05/26/2019   Sinus pressure 05/26/2019   Hypophosphatemia 01/07/2019   Vertigo 09/23/2018   Dysfunction of left eustachian tube 09/23/2018  Gait disorder 04/21/2018   Leukocytosis 04/15/2018   Nausea &  vomiting 04/15/2018   Seizure disorder (HCC)    Prolapsed cervical intervertebral disc 03/11/2018   Cervical radiculopathy 02/18/2018   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   Syncope 09/04/2017   Constipation 09/04/2017   Nonintractable headache 09/04/2017   Leg swelling 07/26/2017   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   HLD (hyperlipidemia) 04/20/2017   Pedal edema 04/19/2017   Bipolar I disorder (HCC) 09/19/2016   Facial pain 07/12/2016   Rash 06/14/2016   Asthma/COPD exacerbation 02/21/2016   Migraine 01/25/2016   Idiopathic urticaria/pruritus 01/23/2016   Allergic rhinitis 01/23/2016   Oral candidiasis 01/23/2016   Greater trochanteric bursitis of both hips 09/08/2015   Chest pain 06/22/2015   Itching 06/22/2015   Bilateral knee pain 06/22/2015   Bilateral hip pain 06/22/2015   Anemia, iron deficiency 12/22/2014   Fatigue 12/21/2014   Rash and nonspecific skin eruption 08/24/2014   Intertrigo 08/07/2014   Dyspareunia 07/12/2014   Menopausal syndrome (hot flushes) 07/12/2014   History of tobacco abuse 07/12/2014   Dizziness 06/22/2014   Weakness 06/22/2014   Fibromyalgia 05/14/2014   Encounter for well adult exam with abnormal findings 04/22/2014   Recurrent boils 04/22/2014   Recurrent falls 04/22/2014   Peripheral vascular disease (HCC)    Depression    Anxiety    GERD (gastroesophageal reflux disease)    Prediabetes 02/23/2014   COPD exacerbation (HCC) 02/23/2014   Screening mammogram for high-risk patient 02/23/2014   Back pain 07/22/2013   COPD/asthma 08/24/2009   Headache(784.0) 08/24/2009   HTN (hypertension) 05/12/2009   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    Past Surgical History:  Procedure Laterality Date   ABDOMINAL HYSTERECTOMY     ANTERIOR CERVICAL DECOMP/DISCECTOMY FUSION N/A 07/28/2020   Procedure: ANTERIOR CERVICAL DECOMPRESSION/DISCECTOMY FUSION. INTERBODY PROTHESIS, PLATE/SCREWS CERVICAL THREE- CERVICAL FOUR, CERVICAL FOUR- CERVICAL  FIVE;  Surgeon: Tressie Stalker, MD;  Location: [facility] OR;  Service: Neurosurgery;  Laterality: N/A;   BACK SURGERY     COLONOSCOPY  12/20/2011   Procedure: COLONOSCOPY;  Surgeon: Freddy Jaksch, MD;  Location: [facility] ENDOSCOPY;  Service: Endoscopy;  Laterality: N/A;   COLONOSCOPY  03/05/2012   Procedure: COLONOSCOPY;  Surgeon: Freddy Jaksch, MD;  Location: [facility] ENDOSCOPY;  Service: Endoscopy;  Laterality: N/A;   DIAGNOSTIC LAPAROSCOPY     HEMORRHOID SURGERY     INCISE AND DRAIN ABCESS     KIDNEY STONE SURGERY     NECK SURGERY     x 2 Dr Terrilee Files   SPINE SURGERY  2019   TOE SURGERY     TUBAL LIGATION      Family History  Problem Relation Age of Onset   Diabetes Mother    COPD Mother    Heart disease Mother    Asthma Mother    Diabetes Father    Kidney disease Father    Anesthesia problems Father    Alcohol abuse Father    Colon cancer Father 27   Diabetes Sister    Hypertension Sister    Heart disease Sister    Diabetes Sister    Brain cancer Sister    Hypertension Sister    Asthma Sister    Diabetes Sister    COPD Sister    Hypertension Sister    Breast cancer Sister 29   Anemia Sister    Hypertension Sister    Diabetes Sister    Anemia Sister    Hypertension Sister    Diabetes Brother  Sleep apnea Brother    Asthma Brother    Alcohol abuse Brother    Heart disease Brother    Heart disease Brother    Hypertension Brother    Hypertension Daughter    Anemia Daughter    Allergic rhinitis Neg Hx    Eczema Neg Hx    Urticaria Neg Hx    Esophageal cancer Neg Hx    Prostate cancer Neg Hx    Rectal cancer Neg Hx     Social History   Tobacco Use   Smoking status: Former    Current packs/day: 0.00    Average packs/day: 0.3 packs/day for 36.0 years (9.0 ttl pk-yrs)    Types: Cigarettes    Start date: 01/21/1983    Quit date: 01/21/2019    Years since quitting: 4.5   Smokeless tobacco: Never   Tobacco comments:    Passive smoker.  Patient states her quit date was  05/09/2018.  Patient educated with resources at today's appointment to continue to support her to stop smoking.  Vaping Use   Vaping status: Never Used  Substance Use Topics   Alcohol use: Not Currently   Drug use: Not Currently    Types: Marijuana    Comment: reports she has stopped smoking marijuana     Current Outpatient Medications:    azithromycin (ZITHROMAX) 250 MG tablet, Take 2 tablets on day 1, then 1 tablet daily on days 2 through 5, Disp: 6 tablet, Rfl: 0   predniSONE (DELTASONE) 20 MG tablet, Take 2 tablets (40 mg total) by mouth daily with breakfast., Disp: 10 tablet, Rfl: 0   albuterol (VENTOLIN HFA) 108 (90 Base) MCG/ACT inhaler, Inhale 2 puffs into the lungs every 6 (six) hours as needed for wheezing or shortness of breath., Disp: 18 g, Rfl: 1   Albuterol-Budesonide (AIRSUPRA) 90-80 MCG/ACT AERO, Inhale 2 puffs into the lungs every 6 (six) hours as needed., Disp: 10.7 g, Rfl: 5   ARIPiprazole (ABILIFY) 10 MG tablet, Take 10 mg by mouth daily., Disp: , Rfl:    atorvastatin (LIPITOR) 40 MG tablet, Take 1 tablet (40 mg total) by mouth daily. TAKE 1 TABLET(40 MG) BY MOUTH DAILY Strength: 40 mg (Patient taking differently: Take 40 mg by mouth daily.), Disp: 90 tablet, Rfl: 3   azelastine (OPTIVAR) 0.05 % ophthalmic solution, Place 1 drop into both eyes 2 (two) times daily., Disp: 6 mL, Rfl: 12   Budeson-Glycopyrrol-Formoterol (BREZTRI AEROSPHERE) 160-9-4.8 MCG/ACT AERO, Inhale 2 puffs into the lungs in the morning and at bedtime., Disp: 10.7 g, Rfl: 11   budesonide (PULMICORT) 0.5 MG/2ML nebulizer solution, Take 0.5 mg by nebulization daily., Disp: , Rfl:    carbamazepine (TEGRETOL) 200 MG tablet, Take 2-3 tablets (400-600 mg total) by mouth See admin instructions. Take 400mg  in the morning and 600mg  at bedtime., Disp: 150 tablet, Rfl: 2   cetirizine (ZYRTEC) 10 MG tablet, Can take one tablet by mouth once daily if needed for runny nose., Disp: 30 tablet, Rfl: 5   cyclobenzaprine  (FLEXERIL) 5 MG tablet, Take 5 mg by mouth 3 (three) times daily as needed., Disp: , Rfl:    DUPIXENT 300 MG/2ML prefilled syringe, Inject 300 mg into the skin every 14 (fourteen) days. Every other Wednesday, Disp: , Rfl:    famotidine (PEPCID) 40 MG tablet, TAKE 1 TABLET(40 MG) BY MOUTH AT BEDTIME, Disp: 30 tablet, Rfl: 3   ferrous sulfate 325 (65 FE) MG EC tablet, Take 1 tablet (325 mg total) by mouth daily with  breakfast., Disp: 90 tablet, Rfl: 1   fluticasone (FLONASE) 50 MCG/ACT nasal spray, Place 1 spray into both nostrils 2 (two) times daily as needed for allergies or rhinitis., Disp: 16 g, Rfl: 5   furosemide (LASIX) 20 MG tablet, 1 tab by mouth once daily as needed (Patient taking differently: Take 20 mg by mouth daily as needed for edema.), Disp: 30 tablet, Rfl: 5   gabapentin (NEURONTIN) 100 MG capsule, Take 1 capsule (100 mg total) by mouth 3 (three) times daily., Disp: 90 capsule, Rfl: 3   hydrochlorothiazide (MICROZIDE) 12.5 MG capsule, Take 1 capsule (12.5 mg total) by mouth daily. TAKE 1 CAPSULE(12.5 MG) BY MOUTH DAILY Strength: 12.5 mg (Patient taking differently: Take 12.5 mg by mouth daily.), Disp: 90 capsule, Rfl: 3   hydrOXYzine (VISTARIL) 25 MG capsule, Take 25 mg by mouth 2 (two) times daily as needed., Disp: , Rfl:    ipratropium (ATROVENT HFA) 17 MCG/ACT inhaler, 2 puffs every 4-6 hours as needed., Disp: 12.9 g, Rfl: 5   ipratropium-albuterol (DUONEB) 0.5-2.5 (3) MG/3ML SOLN, Take 3 mLs by nebulization 2 (two) times daily., Disp: 360 mL, Rfl: 3   irbesartan (AVAPRO) 75 MG tablet, TAKE 1 TABLET(75 MG) BY MOUTH DAILY (Patient taking differently: Take 75 mg by mouth daily.), Disp: 90 tablet, Rfl: 1   montelukast (SINGULAIR) 10 MG tablet, Take 1 tablet (10 mg total) by mouth at bedtime., Disp: 90 tablet, Rfl: 1   pantoprazole (PROTONIX) 40 MG tablet, TAKE 1 TABLET(40 MG) BY MOUTH TWICE DAILY, Disp: 60 tablet, Rfl: 3   Skin Protectants, Misc. (EUCERIN) cream, Apply 1 Application  topically daily as needed for dry skin., Disp: , Rfl:    tacrolimus (PROTOPIC) 0.03 % ointment, Apply 1 Application topically 2 (two) times daily as needed (rash). (Patient taking differently: Apply 1 Application topically 2 (two) times daily as needed (recurrent rash).), Disp: 60 g, Rfl: 5   triamcinolone ointment (KENALOG) 0.1 %, Apply 1 Application topically at bedtime., Disp: 30 g, Rfl: 5  Current Facility-Administered Medications:    dupilumab (DUPIXENT) prefilled syringe 300 mg, 300 mg, Subcutaneous, Q14 Days, Alfonse Spruce, MD, 300 mg at 07/30/23 1347  Allergies  Allergen Reactions   Sulfa Antibiotics Nausea And Vomiting and Other (See Comments)   Venofer [Iron Sucrose] Hives    ROS: See pertinent positives and negatives per HPI.  OBSERVATIONS/OBJECTIVE:  VITALS per patient if applicable: Today's Vitals   08/07/23 1628  BP: (!) 113/93   There is no height or weight on file to calculate BMI.    GENERAL: Alert and oriented. Appears ill, laying in bed.  HEENT: Atraumatic. Conjunctiva clear. No obvious abnormalities on inspection of external nose and ears.  NECK: Normal movements of the head and neck.  LUNGS: On inspection, no signs of respiratory distress. She does appear slightly short of breath after talking for long periods.   CV: No obvious cyanosis.  MS: Moves all visible extremities without noticeable abnormality.  PSYCH/NEURO: Pleasant and cooperative. No obvious depression or anxiety. Speech and thought processing grossly intact.  ASSESSMENT AND PLAN:  Problem List Items Addressed This Visit       Other   COVID-19 - Primary    Symptoms started 6 days ago, she is outside the window for Paxlovid.  She went to the ER on 08/04/2020 and had normal labs and vital signs.  She states that she was not feeling better and took a COVID test at home and it came back positive.  She is  still having some shortness of breath, she has been using her inhalers and  nebulizers regularly.  Will have her start prednisone 40 mg x 5 days and a Z-Pak.   She does not have a pulse oximeter at home to check her oxygen levels. Recommend that she follow-up in person if her symptoms worsen or do not improve in the next 2 to 3 days.       Relevant Medications   azithromycin (ZITHROMAX) 250 MG tablet     I discussed the assessment and treatment plan with the patient. The patient was provided an opportunity to ask questions and all were answered. The patient agreed with the plan and demonstrated an understanding of the instructions.   The patient was advised to call back or seek an in-person evaluation if the symptoms worsen or if the condition fails to improve as anticipated.   Gerre Scull, NP

## 2023-08-07 NOTE — Assessment & Plan Note (Signed)
Symptoms started 6 days ago, she is outside the window for Paxlovid.  She went to the ER on 08/04/2020 and had normal labs and vital signs.  She states that she was not feeling better and took a COVID test at home and it came back positive.  She is still having some shortness of breath, she has been using her inhalers and nebulizers regularly.  Will have her start prednisone 40 mg x 5 days and a Z-Pak.   She does not have a pulse oximeter at home to check her oxygen levels. Recommend that she follow-up in person if her symptoms worsen or do not improve in the next 2 to 3 days.

## 2023-08-08 ENCOUNTER — Telehealth: Payer: Self-pay | Admitting: *Deleted

## 2023-08-08 ENCOUNTER — Ambulatory Visit (HOSPITAL_COMMUNITY): Payer: Medicare HMO

## 2023-08-08 NOTE — Transitions of Care (Post Inpatient/ED Visit) (Signed)
08/08/2023  Name: [name] MRN: [id number] DOB: 02/17/1961  Today's TOC FU Call Status: Today's TOC FU Call Status:: Successful TOC FU Call Completed Unsuccessful Call (1st Attempt) Date: 08/08/23 [facility] FU Call Complete Date: 08/08/23  Transition Care Management Follow-up Telephone Call Date of Discharge: 08/08/23 Discharge Facility: [facility]) Type of Discharge: Emergency Department How have you been since you were released from the hospital?: Same Any questions or concerns?: No  Items Reviewed: Did you receive and understand the discharge instructions provided?: No Medications obtained,verified, and reconciled?: Yes (Medications Reviewed) Any new allergies since your discharge?: No Dietary orders reviewed?: No Do you have support at home?: Yes People in Home: alone Name of Support/Comfort Primary Source: Ryan  Medications Reviewed Today: Medications Reviewed Today     Reviewed by Luella Cook, RN (Case Manager) on 08/08/23 at 1409  Med List Status: <None>   Medication Order Taking? Sig Documenting Provider Last Dose Status Informant  albuterol (VENTOLIN HFA) 108 (90 Base) MCG/ACT inhaler [id number] Yes Inhale 2 puffs into the lungs every 6 (six) hours as needed for wheezing or shortness of breath. Sherryll Burger, Pratik D, DO Taking Active   Albuterol-Budesonide [facility]) 90-80 MCG/ACT Sandrea Matte [id number] Yes Inhale 2 puffs into the lungs every 6 (six) hours as needed. Corwin Levins, MD Taking Active Self  ARIPiprazole (ABILIFY) 10 MG tablet [id number] Yes Take 10 mg by mouth daily. [provider] Taking Active Self  atorvastatin (LIPITOR) 40 MG tablet [id number] Yes Take 1 tablet (40 mg total) by mouth daily. TAKE 1 TABLET(40 MG) BY MOUTH DAILY Strength: 40 mg  Patient taking differently: Take 40 mg by mouth daily.   Corwin Levins, MD Taking Active Self  azelastine (OPTIVAR) 0.05 % ophthalmic solution [id number] Yes Place 1 drop into both eyes 2 (two)  times daily. Corwin Levins, MD Taking Active Self  azithromycin [facility]) 250 MG tablet [id number] Yes Take 2 tablets on day 1, then 1 tablet daily on days 2 through 5 McElwee, Lauren A, NP Taking Active   Budeson-Glycopyrrol-Formoterol (BREZTRI AEROSPHERE) 160-9-4.8 MCG/ACT AERO [id number] Yes Inhale 2 puffs into the lungs in the morning and at bedtime. Sherryll Burger, Pratik D, DO Taking Active   budesonide (PULMICORT) 0.5 MG/2ML nebulizer solution [id number] Yes Take 0.5 mg by nebulization daily. [provider] Taking Active Self  carbamazepine (TEGRETOL) 200 MG tablet [id number] Yes Take 2-3 tablets (400-600 mg total) by mouth See admin instructions. Take 400mg  in the morning and 600mg  at bedtime. Archer Asa, MD Taking Active Self  cetirizine (ZYRTEC) 10 MG tablet [id number] Yes Can take one tablet by mouth once daily if needed for runny nose. Alfonse Spruce, MD Taking Active Self  cyclobenzaprine (FLEXERIL) 5 MG tablet [id number] Yes Take 5 mg by mouth 3 (three) times daily as needed. [provider] Taking Active Self  dupilumab (DUPIXENT) prefilled syringe 300 mg [id number]   Alfonse Spruce, MD  Active   DUPIXENT 300 MG/2ML prefilled syringe [id number] Yes Inject 300 mg into the skin every 14 (fourteen) days. Every other Wednesday [provider] Taking Active Self  famotidine (PEPCID) 40 MG tablet [id number] Yes TAKE 1 TABLET(40 MG) BY MOUTH AT BEDTIME Glenford Bayley, NP Taking Active   ferrous sulfate 325 (65 FE) MG EC tablet [id number] Yes Take 1 tablet (325 mg total) by mouth daily with breakfast. Corwin Levins, MD Taking Active Self  fluticasone (FLONASE) 50 MCG/ACT nasal spray [id number] Yes Place 1 spray into both nostrils 2 (two)  times daily as needed for allergies or rhinitis. Alfonse Spruce, MD Taking Active Self  furosemide (LASIX) 20 MG tablet [id number] Yes 1 tab by mouth once daily as needed  Patient taking differently: Take 20 mg by mouth daily  as needed for edema.   Corwin Levins, MD Taking Active Self  gabapentin (NEURONTIN) 100 MG capsule [id number] Yes Take 1 capsule (100 mg total) by mouth 3 (three) times daily. Archer Asa, MD Taking Active Self  hydrochlorothiazide (MICROZIDE) 12.5 MG capsule [id number] Yes Take 1 capsule (12.5 mg total) by mouth daily. TAKE 1 CAPSULE(12.5 MG) BY MOUTH DAILY Strength: 12.5 mg  Patient taking differently: Take 12.5 mg by mouth daily.   Corwin Levins, MD Taking Active Self  hydrOXYzine (VISTARIL) 25 MG capsule [id number] Yes Take 25 mg by mouth 2 (two) times daily as needed. [provider] Taking Active Self  ipratropium (ATROVENT HFA) 17 MCG/ACT inhaler [id number] Yes 2 puffs every 4-6 hours as needed. Alfonse Spruce, MD Taking Active Self  ipratropium-albuterol (DUONEB) 0.5-2.5 (3) MG/3ML SOLN [id number] Yes Take 3 mLs by nebulization 2 (two) times daily. Kathlen Mody, MD Taking Active Self  irbesartan (AVAPRO) 75 MG tablet [id number] Yes TAKE 1 TABLET(75 MG) BY MOUTH DAILY  Patient taking differently: Take 75 mg by mouth daily.   Corwin Levins, MD Taking Active Self  montelukast (SINGULAIR) 10 MG tablet [id number] Yes Take 1 tablet (10 mg total) by mouth at bedtime. Alfonse Spruce, MD Taking Active Self  nirmatrelvir/ritonavir (PAXLOVID) 20 x 150 MG & 10 x 100MG  TABS [id number] Yes Take 3 tablets by mouth 2 (two) times daily for 5 days. Patient GFR is normal. Take nirmatrelvir (150 mg) two tablets twice daily for 5 days and ritonavir (100 mg) one tablet twice daily for 5 days. Alfonse Spruce, MD Taking Active   pantoprazole (PROTONIX) 40 MG tablet [id number] Yes TAKE 1 TABLET(40 MG) BY MOUTH TWICE DAILY Glenford Bayley, NP Taking Active   predniSONE (DELTASONE) 20 MG tablet [id number] Yes Take 2 tablets (40 mg total) by mouth daily with breakfast. McElwee, Lauren A, NP Taking Active   Skin Protectants, Misc. (EUCERIN) cream [id number] Yes Apply 1 Application topically  daily as needed for dry skin. [provider] Taking Active Self  tacrolimus (PROTOPIC) 0.03 % ointment [id number] Yes Apply 1 Application topically 2 (two) times daily as needed (rash).  Patient taking differently: Apply 1 Application topically 2 (two) times daily as needed (recurrent rash).   Alfonse Spruce, MD Taking Active Self  triamcinolone ointment (KENALOG) 0.1 % [id number] Yes Apply 1 Application topically at bedtime. Alfonse Spruce, MD Taking Active Self            Home Care and Equipment/Supplies: Were Home Health Services Ordered?: NA Any new equipment or medical supplies ordered?: NA  Functional Questionnaire: Do you need assistance with bathing/showering or dressing?: No Do you need assistance with meal preparation?: No Do you need assistance with eating?: No Do you have difficulty maintaining continence: No Do you need assistance with getting out of bed/getting out of a chair/moving?: No Do you have difficulty managing or taking your medications?: No  Follow up appointments reviewed: PCP Follow-up appointment confirmed?: Yes Date of PCP follow-up appointment?: 08/06/21 (video) Follow-up Provider: lauren Rosana Hoes Specialist [facility] Follow-up appointment confirmed?: NA Do you need transportation to your follow-up appointment?: No Do you understand care options if your condition(s) worsen?: Yes-patient verbalized understanding  SDOH Interventions Today    Flowsheet  Row Most Recent Value  SDOH Interventions   Food Insecurity Interventions Intervention Not Indicated  Housing Interventions Intervention Not Indicated  Transportation Interventions Intervention Not Indicated, Patient Resources (Friends/Family)      Interventions Today    Flowsheet Row Most Recent Value  Chronic Disease   Chronic disease during today's visit Chronic Obstructive Pulmonary Disease (COPD)  General Interventions   General Interventions Discussed/Reviewed General  Interventions Discussed, General Interventions Reviewed, Doctor Visits, Communication with  [RN made [facility] Coordinator aware that patient had been to Emergency room]  Doctor Visits Discussed/Reviewed Doctor Visits Discussed  Communication with Social Work  [Referred to [employer] to assist with patient getting back into [facility] therapy]  Education Interventions   Provided Verbal Education On Nutrition  [RN discussed foods to boost immune system]  Mental Health Interventions   Mental Health Discussed/Reviewed Mental Health Discussed  [Referred to SW Sammuel Hines to assist patient to get back in therapy]  Nutrition Interventions   Nutrition Discussed/Reviewed Nutrition Discussed  Pharmacy Interventions   Pharmacy Dicussed/Reviewed Pharmacy Topics Discussed      TOC Interventions Today    Flowsheet Row Most Recent Value  TOC Interventions   TOC Interventions Discussed/Reviewed TOC Interventions Discussed, TOC Interventions Reviewed      Interventions Today    Flowsheet Row Most Recent Value  Chronic Disease   Chronic disease during today's visit Chronic Obstructive Pulmonary Disease (COPD)  General Interventions   General Interventions Discussed/Reviewed General Interventions Discussed, General Interventions Reviewed, Doctor Visits, Communication with  [RN made [facility] Coordinator aware that patient had been to Emergency room]  Doctor Visits Discussed/Reviewed Doctor Visits Discussed  Communication with Social Work  [Referred to [employer] to assist with patient getting back into [facility] therapy]  Education Interventions   Provided Verbal Education On Nutrition  [RN discussed foods to boost immune system]  Mental Health Interventions   Mental Health Discussed/Reviewed Mental Health Discussed  [Referred to SW Sammuel Hines to assist patient to get back in therapy]  Nutrition Interventions   Nutrition Discussed/Reviewed Nutrition Discussed  Pharmacy Interventions    Pharmacy Dicussed/Reviewed Pharmacy Topics Discussed        Gean Maidens BSN RN [facility] [phone]

## 2023-08-11 ENCOUNTER — Emergency Department (HOSPITAL_COMMUNITY): Payer: Medicare HMO

## 2023-08-11 ENCOUNTER — Other Ambulatory Visit: Payer: Self-pay

## 2023-08-11 ENCOUNTER — Inpatient Hospital Stay (HOSPITAL_COMMUNITY)
Admission: EM | Admit: 2023-08-11 | Discharge: 2023-08-20 | DRG: 190 | Disposition: A | Discharge status: Home Health | Payer: Medicare HMO | Attending: Family Medicine | Admitting: Family Medicine

## 2023-08-11 ENCOUNTER — Encounter (HOSPITAL_COMMUNITY): Payer: Self-pay

## 2023-08-11 ENCOUNTER — Emergency Department (HOSPITAL_COMMUNITY)
Admission: EM | Admit: 2023-08-11 | Discharge: 2023-08-11 | Disposition: A | Discharge status: Home / Self Care | Payer: Medicare HMO | Source: Home / Self Care | Attending: Emergency Medicine | Admitting: Emergency Medicine

## 2023-08-11 DIAGNOSIS — R069 Unspecified abnormalities of breathing: Secondary | ICD-10-CM | POA: Diagnosis not present

## 2023-08-11 DIAGNOSIS — D500 Iron deficiency anemia secondary to blood loss (chronic): Secondary | ICD-10-CM | POA: Diagnosis present

## 2023-08-11 DIAGNOSIS — K5900 Constipation, unspecified: Secondary | ICD-10-CM | POA: Diagnosis present

## 2023-08-11 DIAGNOSIS — I10 Essential (primary) hypertension: Secondary | ICD-10-CM | POA: Diagnosis not present

## 2023-08-11 DIAGNOSIS — R748 Abnormal levels of other serum enzymes: Secondary | ICD-10-CM | POA: Diagnosis present

## 2023-08-11 DIAGNOSIS — Z90710 Acquired absence of both cervix and uterus: Secondary | ICD-10-CM

## 2023-08-11 DIAGNOSIS — Z800 Family history of malignant neoplasm of digestive organs: Secondary | ICD-10-CM

## 2023-08-11 DIAGNOSIS — J441 Chronic obstructive pulmonary disease with (acute) exacerbation: Principal | ICD-10-CM | POA: Diagnosis present

## 2023-08-11 DIAGNOSIS — E785 Hyperlipidemia, unspecified: Secondary | ICD-10-CM | POA: Diagnosis present

## 2023-08-11 DIAGNOSIS — Z91199 Patient's noncompliance with other medical treatment and regimen due to unspecified reason: Secondary | ICD-10-CM

## 2023-08-11 DIAGNOSIS — R7303 Prediabetes: Secondary | ICD-10-CM | POA: Diagnosis present

## 2023-08-11 DIAGNOSIS — F19950 Other psychoactive substance use, unspecified with psychoactive substance-induced psychotic disorder with delusions: Secondary | ICD-10-CM | POA: Diagnosis present

## 2023-08-11 DIAGNOSIS — F060 Psychotic disorder with hallucinations due to known physiological condition: Secondary | ICD-10-CM | POA: Diagnosis not present

## 2023-08-11 DIAGNOSIS — E669 Obesity, unspecified: Secondary | ICD-10-CM | POA: Diagnosis present

## 2023-08-11 DIAGNOSIS — Z8249 Family history of ischemic heart disease and other diseases of the circulatory system: Secondary | ICD-10-CM

## 2023-08-11 DIAGNOSIS — I739 Peripheral vascular disease, unspecified: Secondary | ICD-10-CM | POA: Diagnosis present

## 2023-08-11 DIAGNOSIS — R000 Tachycardia, unspecified: Secondary | ICD-10-CM | POA: Diagnosis not present

## 2023-08-11 DIAGNOSIS — Z8673 Personal history of transient ischemic attack (TIA), and cerebral infarction without residual deficits: Secondary | ICD-10-CM | POA: Diagnosis not present

## 2023-08-11 DIAGNOSIS — G40909 Epilepsy, unspecified, not intractable, without status epilepticus: Secondary | ICD-10-CM | POA: Diagnosis not present

## 2023-08-11 DIAGNOSIS — Z6832 Body mass index (BMI) 32.0-32.9, adult: Secondary | ICD-10-CM | POA: Diagnosis not present

## 2023-08-11 DIAGNOSIS — R4182 Altered mental status, unspecified: Secondary | ICD-10-CM | POA: Diagnosis not present

## 2023-08-11 DIAGNOSIS — J449 Chronic obstructive pulmonary disease, unspecified: Secondary | ICD-10-CM | POA: Diagnosis not present

## 2023-08-11 DIAGNOSIS — Z882 Allergy status to sulfonamides status: Secondary | ICD-10-CM | POA: Diagnosis not present

## 2023-08-11 DIAGNOSIS — F319 Bipolar disorder, unspecified: Secondary | ICD-10-CM | POA: Diagnosis not present

## 2023-08-11 DIAGNOSIS — K7460 Unspecified cirrhosis of liver: Secondary | ICD-10-CM | POA: Diagnosis present

## 2023-08-11 DIAGNOSIS — F6381 Intermittent explosive disorder: Secondary | ICD-10-CM | POA: Diagnosis present

## 2023-08-11 DIAGNOSIS — R739 Hyperglycemia, unspecified: Secondary | ICD-10-CM | POA: Diagnosis present

## 2023-08-11 DIAGNOSIS — R7401 Elevation of levels of liver transaminase levels: Secondary | ICD-10-CM | POA: Diagnosis present

## 2023-08-11 DIAGNOSIS — M797 Fibromyalgia: Secondary | ICD-10-CM | POA: Diagnosis present

## 2023-08-11 DIAGNOSIS — U071 COVID-19: Secondary | ICD-10-CM | POA: Diagnosis not present

## 2023-08-11 DIAGNOSIS — Z7951 Long term (current) use of inhaled steroids: Secondary | ICD-10-CM | POA: Diagnosis not present

## 2023-08-11 DIAGNOSIS — D72829 Elevated white blood cell count, unspecified: Secondary | ICD-10-CM | POA: Insufficient documentation

## 2023-08-11 DIAGNOSIS — J45909 Unspecified asthma, uncomplicated: Secondary | ICD-10-CM | POA: Insufficient documentation

## 2023-08-11 DIAGNOSIS — D509 Iron deficiency anemia, unspecified: Secondary | ICD-10-CM | POA: Diagnosis not present

## 2023-08-11 DIAGNOSIS — E876 Hypokalemia: Secondary | ICD-10-CM | POA: Diagnosis not present

## 2023-08-11 DIAGNOSIS — I700 Atherosclerosis of aorta: Secondary | ICD-10-CM | POA: Diagnosis not present

## 2023-08-11 DIAGNOSIS — G9340 Encephalopathy, unspecified: Secondary | ICD-10-CM | POA: Diagnosis present

## 2023-08-11 DIAGNOSIS — Z811 Family history of alcohol abuse and dependence: Secondary | ICD-10-CM

## 2023-08-11 DIAGNOSIS — R519 Headache, unspecified: Secondary | ICD-10-CM | POA: Diagnosis present

## 2023-08-11 DIAGNOSIS — F419 Anxiety disorder, unspecified: Secondary | ICD-10-CM | POA: Diagnosis present

## 2023-08-11 DIAGNOSIS — K219 Gastro-esophageal reflux disease without esophagitis: Secondary | ICD-10-CM | POA: Diagnosis not present

## 2023-08-11 DIAGNOSIS — Z87891 Personal history of nicotine dependence: Secondary | ICD-10-CM | POA: Diagnosis not present

## 2023-08-11 DIAGNOSIS — Z825 Family history of asthma and other chronic lower respiratory diseases: Secondary | ICD-10-CM

## 2023-08-11 DIAGNOSIS — Z841 Family history of disorders of kidney and ureter: Secondary | ICD-10-CM

## 2023-08-11 DIAGNOSIS — Z833 Family history of diabetes mellitus: Secondary | ICD-10-CM

## 2023-08-11 DIAGNOSIS — Z79899 Other long term (current) drug therapy: Secondary | ICD-10-CM

## 2023-08-11 DIAGNOSIS — G4733 Obstructive sleep apnea (adult) (pediatric): Secondary | ICD-10-CM | POA: Diagnosis present

## 2023-08-11 DIAGNOSIS — T380X5A Adverse effect of glucocorticoids and synthetic analogues, initial encounter: Secondary | ICD-10-CM | POA: Diagnosis not present

## 2023-08-11 DIAGNOSIS — R059 Cough, unspecified: Secondary | ICD-10-CM | POA: Diagnosis not present

## 2023-08-11 DIAGNOSIS — J9610 Chronic respiratory failure, unspecified whether with hypoxia or hypercapnia: Secondary | ICD-10-CM | POA: Diagnosis not present

## 2023-08-11 DIAGNOSIS — Z7952 Long term (current) use of systemic steroids: Secondary | ICD-10-CM

## 2023-08-11 DIAGNOSIS — E7800 Pure hypercholesterolemia, unspecified: Secondary | ICD-10-CM | POA: Diagnosis not present

## 2023-08-11 DIAGNOSIS — Z888 Allergy status to other drugs, medicaments and biological substances status: Secondary | ICD-10-CM

## 2023-08-11 DIAGNOSIS — R062 Wheezing: Secondary | ICD-10-CM | POA: Diagnosis not present

## 2023-08-11 DIAGNOSIS — Z87898 Personal history of other specified conditions: Secondary | ICD-10-CM | POA: Diagnosis not present

## 2023-08-11 DIAGNOSIS — R410 Disorientation, unspecified: Secondary | ICD-10-CM | POA: Diagnosis not present

## 2023-08-11 DIAGNOSIS — Z808 Family history of malignant neoplasm of other organs or systems: Secondary | ICD-10-CM

## 2023-08-11 DIAGNOSIS — R0689 Other abnormalities of breathing: Secondary | ICD-10-CM | POA: Diagnosis not present

## 2023-08-11 DIAGNOSIS — Z803 Family history of malignant neoplasm of breast: Secondary | ICD-10-CM

## 2023-08-11 LAB — BASIC METABOLIC PANEL
Anion gap: 15 (ref 5–15)
BUN: 17 mg/dL (ref 6–20)
CO2: 25 mmol/L (ref 22–32)
Calcium: 9.4 mg/dL (ref 8.9–10.3)
Chloride: 101 mmol/L (ref 98–111)
Creatinine, Ser: 0.82 mg/dL (ref 0.44–1.00)
GFR, Estimated: >60 mL/min (ref >60)
Glucose, Bld: 133 mg/dL — ABNORMAL HIGH (ref 70–99)
Potassium: 2.7 mmol/L — CL (ref 3.5–5.1)
Sodium: 141 mmol/L (ref 135–145)

## 2023-08-11 LAB — CBC
HCT: 36.2 % (ref 36.0–46.0)
Hemoglobin: 11.1 g/dL — ABNORMAL LOW (ref 12.0–15.0)
MCH: 26.6 pg (ref 26.0–34.0)
MCHC: 30.7 g/dL (ref 30.0–36.0)
MCV: 86.8 fL (ref 80.0–100.0)
Platelets: 486 10*3/uL — ABNORMAL HIGH (ref 150–400)
RBC: 4.17 MIL/uL (ref 3.87–5.11)
RDW: 15.1 % (ref 11.5–15.5)
WBC: 17 10*3/uL — ABNORMAL HIGH (ref 4.0–10.5)
nRBC: 0 % (ref 0.0–0.2)

## 2023-08-11 LAB — RESP PANEL BY RT-PCR (RSV, FLU A&B, COVID)  RVPGX2
Influenza A by PCR: NEGATIVE
Influenza B by PCR: NEGATIVE
Resp Syncytial Virus by PCR: NEGATIVE
SARS Coronavirus 2 by RT PCR: POSITIVE — AB

## 2023-08-11 LAB — SARS CORONAVIRUS 2 BY RT PCR: SARS Coronavirus 2 by RT PCR: POSITIVE — AB

## 2023-08-11 LAB — I-STAT VENOUS BLOOD GAS, ED
Acid-Base Excess: 3 mmol/L — ABNORMAL HIGH (ref 0.0–2.0)
Bicarbonate: 26.2 mmol/L (ref 20.0–28.0)
Calcium, Ion: 1.1 mmol/L — ABNORMAL LOW (ref 1.15–1.40)
HCT: 34 % — ABNORMAL LOW (ref 36.0–46.0)
Hemoglobin: 11.6 g/dL — ABNORMAL LOW (ref 12.0–15.0)
O2 Saturation: 97 %
Potassium: 3.4 mmol/L — ABNORMAL LOW (ref 3.5–5.1)
Sodium: 139 mmol/L (ref 135–145)
TCO2: 27 mmol/L (ref 22–32)
pCO2, Ven: 36 mmHg — ABNORMAL LOW (ref 44–60)
pH, Ven: 7.47 — ABNORMAL HIGH (ref 7.25–7.43)
pO2, Ven: 84 mmHg — ABNORMAL HIGH (ref 32–45)

## 2023-08-11 MED ORDER — ALBUTEROL SULFATE (2.5 MG/3ML) 0.083% IN NEBU
INHALATION_SOLUTION | RESPIRATORY_TRACT | Status: AC
  Administered 2023-08-11: 2.5 mg via RESPIRATORY_TRACT
  Filled 2023-08-11: qty 3

## 2023-08-11 MED ORDER — ALBUTEROL SULFATE (2.5 MG/3ML) 0.083% IN NEBU
10.0000 mg | INHALATION_SOLUTION | Freq: Once | RESPIRATORY_TRACT | Status: AC
  Administered 2023-08-11: 10 mg via RESPIRATORY_TRACT
  Filled 2023-08-11: qty 12

## 2023-08-11 MED ORDER — IPRATROPIUM BROMIDE 0.02 % IN SOLN
0.5000 mg | Freq: Once | RESPIRATORY_TRACT | Status: AC
  Administered 2023-08-11: 0.5 mg via RESPIRATORY_TRACT
  Filled 2023-08-11: qty 2.5

## 2023-08-11 MED ORDER — POTASSIUM CHLORIDE CRYS ER 20 MEQ PO TBCR
40.0000 meq | EXTENDED_RELEASE_TABLET | Freq: Once | ORAL | Status: AC
  Administered 2023-08-11: 40 meq via ORAL
  Filled 2023-08-11: qty 2

## 2023-08-11 MED ORDER — ALBUTEROL SULFATE (2.5 MG/3ML) 0.083% IN NEBU: 10.0000 mg | INHALATION_SOLUTION | Freq: Once | RESPIRATORY_TRACT | Status: AC

## 2023-08-11 MED ORDER — ALBUTEROL (5 MG/ML) CONTINUOUS INHALATION SOLN
10.0000 mg/h | INHALATION_SOLUTION | Freq: Once | RESPIRATORY_TRACT | Status: DC
  Filled 2023-08-11: qty 20

## 2023-08-11 NOTE — ED Notes (Signed)
Pt ambulated in the hall. Lowest sat was 95 while ambulating.

## 2023-08-11 NOTE — Discharge Instructions (Addendum)
Evaluation today was overall reassuring.  Suspect the fact that you have COVID is contributing to worsening symptoms related to your COPD and asthma.  Recommend you continue taking the Paxlovid and use your inhalers and nebulizer as needed at home.  Your potassium also was noted to be low during this encounter as well.  I treated with oral potassium supplement.  I suspect it is related to multiple treatments with albuterol as albuterol can lower your blood potassium.  Recommend you follow-up with your PCP for recheck of your labs in a couple days and for reassessment of your ongoing COVID infection.  If you have worsening shortness of breath, chest pain, trouble breathing, calf tenderness or any other concerning symptom please return emerged part for evaluation.

## 2023-08-11 NOTE — ED Provider Notes (Signed)
EMERGENCY DEPARTMENT AT [facility] Provider Note   CSN: [id number] Arrival date & time: 08/11/23  2122     History {Add pertinent medical, surgical, social history, OB history to HPI:1} Chief Complaint  Patient presents with   Shortness of Breath    Antonae Ports is a 62 y.o. female Who was seen in the emergency department earlier in the day for COPD exacerbation in context of recently identified acute COVID-19 infection, positive test 5 days ago.  No improvement with her inhalers at that time, received treatment and route with EMS earlier in the day, appears patient received another DuoNeb in the ED with improvement in her symptoms.  Currently on Paxlovid.  She returns this evening with recurrent wheezing and shortness of breath refractory to inhalers at home.  Received 2 DuoNebs, 2 g of magnesium and 125 mg of Solu-Medrol en route by EMS.  States that she did 3 nebulizer treatments at home over the last hour prior to her arrival without improvement in her symptoms prompting repeat called EMS this evening.  I have reviewed her medical records.  History of hypertension, COPD, GERD.  Echo in March 2024 revealed LVEF 60 to 65%.  No oxygen requirement .at baseline  HPI     Home Medications Prior to Admission medications   Medication Sig Start Date End Date Taking? Authorizing Provider  albuterol (VENTOLIN HFA) 108 (90 Base) MCG/ACT inhaler Inhale 2 puffs into the lungs every 6 (six) hours as needed for wheezing or shortness of breath. 06/24/23   Sherryll Burger, Pratik D, DO  Albuterol-Budesonide (AIRSUPRA) 90-80 MCG/ACT AERO Inhale 2 puffs into the lungs every 6 (six) hours as needed. 06/18/23   Corwin Levins, MD  ARIPiprazole (ABILIFY) 10 MG tablet Take 10 mg by mouth daily. 05/02/23   [provider]  atorvastatin (LIPITOR) 40 MG tablet Take 1 tablet (40 mg total) by mouth daily. TAKE 1 TABLET(40 MG) BY MOUTH DAILY Strength: 40 mg Patient taking differently:  Take 40 mg by mouth daily. 05/23/22   Corwin Levins, MD  azelastine (OPTIVAR) 0.05 % ophthalmic solution Place 1 drop into both eyes 2 (two) times daily. 05/18/22   Corwin Levins, MD  azithromycin (ZITHROMAX) 250 MG tablet Take 2 tablets on day 1, then 1 tablet daily on days 2 through 5 08/07/23 08/12/23  McElwee, Lauren A, NP  Budeson-Glycopyrrol-Formoterol (BREZTRI AEROSPHERE) 160-9-4.8 MCG/ACT AERO Inhale 2 puffs into the lungs in the morning and at bedtime. 06/24/23   Sherryll Burger, Pratik D, DO  budesonide (PULMICORT) 0.5 MG/2ML nebulizer solution Take 0.5 mg by nebulization daily. 05/20/23   [provider]  carbamazepine (TEGRETOL) 200 MG tablet Take 2-3 tablets (400-600 mg total) by mouth See admin instructions. Take 400mg  in the morning and 600mg  at bedtime. 02/06/23   Plovsky, Earvin Hansen, MD  cetirizine (ZYRTEC) 10 MG tablet Can take one tablet by mouth once daily if needed for runny nose. 02/28/23   Alfonse Spruce, MD  cyclobenzaprine (FLEXERIL) 5 MG tablet Take 5 mg by mouth 3 (three) times daily as needed. 05/19/23   [provider]  DUPIXENT 300 MG/2ML prefilled syringe Inject 300 mg into the skin every 14 (fourteen) days. Every other Wednesday 05/22/23   [provider]  famotidine (PEPCID) 40 MG tablet TAKE 1 TABLET(40 MG) BY MOUTH AT BEDTIME 07/02/23   Glenford Bayley, NP  ferrous sulfate 325 (65 FE) MG EC tablet Take 1 tablet (325 mg total) by mouth daily with breakfast. 04/08/23  Corwin Levins, MD  fluticasone [facility]) 50 MCG/ACT nasal spray Place 1 spray into both nostrils 2 (two) times daily as needed for allergies or rhinitis. 02/28/23   Alfonse Spruce, MD  furosemide (LASIX) 20 MG tablet 1 tab by mouth once daily as needed Patient taking differently: Take 20 mg by mouth daily as needed for edema. 06/18/23   Corwin Levins, MD  gabapentin (NEURONTIN) 100 MG capsule Take 1 capsule (100 mg total) by mouth 3 (three) times daily. 12/21/22   Plovsky, Earvin Hansen, MD   hydrochlorothiazide (MICROZIDE) 12.5 MG capsule Take 1 capsule (12.5 mg total) by mouth daily. TAKE 1 CAPSULE(12.5 MG) BY MOUTH DAILY Strength: 12.5 mg Patient taking differently: Take 12.5 mg by mouth daily. 05/23/22   Corwin Levins, MD  hydrOXYzine (VISTARIL) 25 MG capsule Take 25 mg by mouth 2 (two) times daily as needed. 05/02/23   [provider]  ipratropium (ATROVENT HFA) 17 MCG/ACT inhaler 2 puffs every 4-6 hours as needed. 02/28/23   Alfonse Spruce, MD  ipratropium-albuterol (DUONEB) 0.5-2.5 (3) MG/3ML SOLN Take 3 mLs by nebulization 2 (two) times daily. 01/15/23   Kathlen Mody, MD  irbesartan (AVAPRO) 75 MG tablet TAKE 1 TABLET(75 MG) BY MOUTH DAILY Patient taking differently: Take 75 mg by mouth daily. 11/06/21   Corwin Levins, MD  montelukast (SINGULAIR) 10 MG tablet Take 1 tablet (10 mg total) by mouth at bedtime. 02/28/23   Alfonse Spruce, MD  nirmatrelvir/ritonavir (PAXLOVID) 20 x 150 MG & 10 x 100MG  TABS Take 3 tablets by mouth 2 (two) times daily for 5 days. Patient GFR is normal. Take nirmatrelvir (150 mg) two tablets twice daily for 5 days and ritonavir (100 mg) one tablet twice daily for 5 days. 08/07/23 08/12/23  Alfonse Spruce, MD  pantoprazole (PROTONIX) 40 MG tablet TAKE 1 TABLET(40 MG) BY MOUTH TWICE DAILY 07/02/23   Glenford Bayley, NP  predniSONE (DELTASONE) 20 MG tablet Take 2 tablets (40 mg total) by mouth daily with breakfast. 08/07/23   McElwee, Jake Church, NP  Skin Protectants, Misc. (EUCERIN) cream Apply 1 Application topically daily as needed for dry skin.    [provider]  tacrolimus (PROTOPIC) 0.03 % ointment Apply 1 Application topically 2 (two) times daily as needed (rash). Patient taking differently: Apply 1 Application topically 2 (two) times daily as needed (recurrent rash). 02/28/23   Alfonse Spruce, MD  triamcinolone ointment (KENALOG) 0.1 % Apply 1 Application topically at bedtime. 02/28/23   Alfonse Spruce, MD       Allergies    Sulfa antibiotics and Venofer [iron sucrose]    Review of Systems   Review of Systems  Constitutional: Negative.   HENT:         Loss of taste and smell  Respiratory:  Positive for cough, chest tightness, shortness of breath and wheezing.   Cardiovascular: Negative.   Gastrointestinal: Negative.     Physical Exam Updated Vital Signs BP (!) 159/89   Pulse 95   Temp 98.2 F (36.8 C) (Oral)   Resp 18   SpO2 97%  Physical Exam Vitals and nursing note reviewed.  Constitutional:      Appearance: She is obese. She is not ill-appearing or toxic-appearing.  HENT:     Head: Normocephalic and atraumatic.     Mouth/Throat:     Mouth: Mucous membranes are moist.     Pharynx: No oropharyngeal exudate or posterior oropharyngeal erythema.  Eyes:  General:        Right eye: No discharge.        Left eye: No discharge.     Extraocular Movements: Extraocular movements intact.     Conjunctiva/sclera: Conjunctivae normal.     Pupils: Pupils are equal, round, and reactive to light.  Cardiovascular:     Rate and Rhythm: Normal rate and regular rhythm.     Pulses: Normal pulses.     Heart sounds: Normal heart sounds. No murmur heard. Pulmonary:     Effort: Pulmonary effort is normal. Tachypnea present. No respiratory distress.     Breath sounds: Examination of the right-upper field reveals wheezing. Examination of the left-upper field reveals wheezing. Examination of the right-middle field reveals wheezing. Examination of the left-middle field reveals wheezing. Examination of the right-lower field reveals wheezing. Examination of the left-lower field reveals wheezing. Wheezing present. No rales.  Chest:     Chest wall: No mass, tenderness or edema.  Abdominal:     General: Bowel sounds are normal. There is no distension.     Palpations: Abdomen is soft.     Tenderness: There is no abdominal tenderness.  Musculoskeletal:        General: No deformity.     Cervical  back: Neck supple.     Right lower leg: No tenderness. No edema.     Left lower leg: No tenderness. No edema.  Skin:    General: Skin is warm and dry.     Capillary Refill: Capillary refill takes less than 2 seconds.  Neurological:     General: No focal deficit present.     Mental Status: She is alert and oriented to person, place, and time. Mental status is at baseline.  Psychiatric:        Mood and Affect: Mood normal.     ED Results / Procedures / Treatments   Labs (all labs ordered are listed, but only abnormal results are displayed) Labs Reviewed - No data to display  EKG EKG Interpretation Date/Time:  Sunday August 11 2023 21:27:54 EDT Ventricular Rate:  98 PR Interval:  132 QRS Duration:  87 QT Interval:  379 QTC Calculation: 484 R Axis:   55  Text Interpretation: Sinus rhythm Borderline repolarization abnormality Confirmed by Fulton Reek [phone]) on 08/11/2023 10:04:12 PM  Radiology DG Chest Port 1 View  Result Date: 08/11/2023 CLINICAL DATA:  62 year old female with history of shortness of breath. EXAM: PORTABLE CHEST 1 VIEW COMPARISON:  Chest x-ray 08/05/2023. FINDINGS: Lung volumes are normal. No consolidative airspace disease. No pleural effusions. No pneumothorax. No pulmonary nodule or mass noted. Pulmonary vasculature and the cardiomediastinal silhouette are within normal limits. Atherosclerosis in the thoracic aorta. Orthopedic fixation hardware in the cervical spine incidentally noted. IMPRESSION: 1.  No radiographic evidence of acute cardiopulmonary disease. 2. Aortic atherosclerosis. Electronically Signed   By: Trudie Reed M.D.   On: 08/11/2023 10:58    Procedures Procedures  {Document cardiac monitor, telemetry assessment procedure when appropriate:1}  Medications Ordered in ED Medications - No data to display  ED Course/ Medical Decision Making/ A&P   {   Click here for ABCD2, HEART and other calculatorsREFRESH Note before signing :1}                               Medical Decision Making 62 year old female who presents with concern for shortness of breath and wheezing.  Seen in the ED this morning for  the same.  COPD in context of acute COVID-19 infection.  Tachypneic and mildly tachycardic on intake.  Normal oxygen saturation of 96% on room air.  Patient's work of breathing is increased, speaking clearly but short of breath with talking, accessory muscle use.  Patient received 2 g of mag 125 Solu-Medrol, 2 DuoNebs en route with EMS.  Will proceed with CAT at this time.  DDX includes but is not limited to COPD exacerbation, CHF, PNA, PE, pleural effusion.   Amount and/or Complexity of Data Reviewed External Data Reviewed: labs.    Details: Potassium of 2.7 this morning.  Repleted with oral potassium 40 mill equivalents Labs: ordered. ECG/medicine tests:     Details: EKG sinus rhythm, no ischemic changes  Risk Prescription drug management.   ***  {Document critical care time when appropriate:1} {Document review of labs and clinical decision tools ie heart score, Chads2Vasc2 etc:1}  {Document your independent review of radiology images, and any outside records:1} {Document your discussion with family members, caretakers, and with consultants:1} {Document social determinants of health affecting pt's care:1} {Document your decision making why or why not admission, treatments were needed:1} Final Clinical Impression(s) / ED Diagnoses Final diagnoses:  None    Rx / DC Orders ED Discharge Orders     None

## 2023-08-11 NOTE — ED Provider Notes (Signed)
[place] EMERGENCY DEPARTMENT AT [facility] Provider Note   CSN: [id number] Arrival date & time: 08/11/23  4098     History  Chief Complaint  Patient presents with   Shortness of Breath    SOB with hx of COPD and Asthma. Recent covid positive at home test. Nebs at home not working. EMS reports wheezing pta. 2 grams mag, 125 solumedrol, 2 duo nebs give pta. Cbg 117 pta. VSS pta. RA sat 95 precent.     HPI [name] is a 62 y.o. female with COPD and asthma, hypertension and hyperlipidemia, h/o stroke presenting for shortness of breath.  States she did a COVID test this past Tuesday it was positive.  She has been notably more short of breath but this morning it was much worse.  She used her inhalers but with no improvement.  At this point she was really having trouble breathing which prompted call for EMS services.  EMS noted that her oxygen saturation was 95% but that she was notably tachypneic.  She was treated with 2 g of mag, Solu-Medrol 125 mg and 2 DuoNeb treatments.  Patient denies chest pain at this time.   Shortness of Breath      Home Medications Prior to Admission medications   Medication Sig Start Date End Date Taking? Authorizing Provider  albuterol (VENTOLIN HFA) 108 (90 Base) MCG/ACT inhaler Inhale 2 puffs into the lungs every 6 (six) hours as needed for wheezing or shortness of breath. 06/24/23   Sherryll Burger, Pratik D, DO  Albuterol-Budesonide (AIRSUPRA) 90-80 MCG/ACT AERO Inhale 2 puffs into the lungs every 6 (six) hours as needed. 06/18/23   Corwin Levins, MD  ARIPiprazole (ABILIFY) 10 MG tablet Take 10 mg by mouth daily. 05/02/23   [provider]  atorvastatin (LIPITOR) 40 MG tablet Take 1 tablet (40 mg total) by mouth daily. TAKE 1 TABLET(40 MG) BY MOUTH DAILY Strength: 40 mg Patient taking differently: Take 40 mg by mouth daily. 05/23/22   Corwin Levins, MD  azelastine (OPTIVAR) 0.05 % ophthalmic solution Place 1 drop into both eyes 2 (two)  times daily. 05/18/22   Corwin Levins, MD  azithromycin (ZITHROMAX) 250 MG tablet Take 2 tablets on day 1, then 1 tablet daily on days 2 through 5 08/07/23 08/12/23  McElwee, Lauren A, NP  Budeson-Glycopyrrol-Formoterol (BREZTRI AEROSPHERE) 160-9-4.8 MCG/ACT AERO Inhale 2 puffs into the lungs in the morning and at bedtime. 06/24/23   Sherryll Burger, Pratik D, DO  budesonide (PULMICORT) 0.5 MG/2ML nebulizer solution Take 0.5 mg by nebulization daily. 05/20/23   [provider]  carbamazepine (TEGRETOL) 200 MG tablet Take 2-3 tablets (400-600 mg total) by mouth See admin instructions. Take 400mg  in the morning and 600mg  at bedtime. 02/06/23   Plovsky, Earvin Hansen, MD  cetirizine (ZYRTEC) 10 MG tablet Can take one tablet by mouth once daily if needed for runny nose. 02/28/23   Alfonse Spruce, MD  cyclobenzaprine (FLEXERIL) 5 MG tablet Take 5 mg by mouth 3 (three) times daily as needed. 05/19/23   [provider]  DUPIXENT 300 MG/2ML prefilled syringe Inject 300 mg into the skin every 14 (fourteen) days. Every other Wednesday 05/22/23   [provider]  famotidine (PEPCID) 40 MG tablet TAKE 1 TABLET(40 MG) BY MOUTH AT BEDTIME 07/02/23   Glenford Bayley, NP  ferrous sulfate 325 (65 FE) MG EC tablet Take 1 tablet (325 mg total) by mouth daily with breakfast. 04/08/23   Corwin Levins, MD  fluticasone (FLONASE) 50 MCG/ACT nasal spray Place 1 spray into both nostrils 2 (two) times daily as needed for allergies or rhinitis. 02/28/23   Alfonse Spruce, MD  furosemide (LASIX) 20 MG tablet 1 tab by mouth once daily as needed Patient taking differently: Take 20 mg by mouth daily as needed for edema. 06/18/23   Corwin Levins, MD  gabapentin (NEURONTIN) 100 MG capsule Take 1 capsule (100 mg total) by mouth 3 (three) times daily. 12/21/22   Plovsky, Earvin Hansen, MD  hydrochlorothiazide (MICROZIDE) 12.5 MG capsule Take 1 capsule (12.5 mg total) by mouth daily. TAKE 1 CAPSULE(12.5 MG) BY MOUTH DAILY Strength: 12.5  mg Patient taking differently: Take 12.5 mg by mouth daily. 05/23/22   Corwin Levins, MD  hydrOXYzine (VISTARIL) 25 MG capsule Take 25 mg by mouth 2 (two) times daily as needed. 05/02/23   [provider]  ipratropium (ATROVENT HFA) 17 MCG/ACT inhaler 2 puffs every 4-6 hours as needed. 02/28/23   Alfonse Spruce, MD  ipratropium-albuterol (DUONEB) 0.5-2.5 (3) MG/3ML SOLN Take 3 mLs by nebulization 2 (two) times daily. 01/15/23   Kathlen Mody, MD  irbesartan (AVAPRO) 75 MG tablet TAKE 1 TABLET(75 MG) BY MOUTH DAILY Patient taking differently: Take 75 mg by mouth daily. 11/06/21   Corwin Levins, MD  montelukast (SINGULAIR) 10 MG tablet Take 1 tablet (10 mg total) by mouth at bedtime. 02/28/23   Alfonse Spruce, MD  nirmatrelvir/ritonavir (PAXLOVID) 20 x 150 MG & 10 x 100MG  TABS Take 3 tablets by mouth 2 (two) times daily for 5 days. Patient GFR is normal. Take nirmatrelvir (150 mg) two tablets twice daily for 5 days and ritonavir (100 mg) one tablet twice daily for 5 days. 08/07/23 08/12/23  Alfonse Spruce, MD  pantoprazole (PROTONIX) 40 MG tablet TAKE 1 TABLET(40 MG) BY MOUTH TWICE DAILY 07/02/23   Glenford Bayley, NP  predniSONE (DELTASONE) 20 MG tablet Take 2 tablets (40 mg total) by mouth daily with breakfast. 08/07/23   McElwee, Jake Church, NP  Skin Protectants, Misc. (EUCERIN) cream Apply 1 Application topically daily as needed for dry skin.    [provider]  tacrolimus (PROTOPIC) 0.03 % ointment Apply 1 Application topically 2 (two) times daily as needed (rash). Patient taking differently: Apply 1 Application topically 2 (two) times daily as needed (recurrent rash). 02/28/23   Alfonse Spruce, MD  triamcinolone ointment (KENALOG) 0.1 % Apply 1 Application topically at bedtime. 02/28/23   Alfonse Spruce, MD      Allergies    Sulfa antibiotics and Venofer [iron sucrose]    Review of Systems   Review of Systems  Respiratory:  Positive for shortness of  breath.     Physical Exam   Vitals:   08/11/23 1045 08/11/23 1233  BP: (!) 150/69 (!) 154/98  Pulse: 95 95  Resp: 19 18  Temp:    SpO2: 95% 94%    CONSTITUTIONAL:  well-appearing, NAD NEURO:  Alert and oriented x 3, CN 3-12 grossly intact EYES:  eyes equal and reactive ENT/NECK:  Supple, no stridor  CARDIO:  Regular rate and rhythm, appears well-perfused  PULM:  No respiratory distress, diminished in all lung fields with inspiratory and expiratory wheezing, no crackles GI/GU:  non-distended, soft MSK/SPINE:  No gross deformities, no edema, moves all extremities  SKIN:  no rash, atraumatic  *Additional and/or pertinent findings included in MDM below  ED Results / Procedures / Treatments   Labs (all labs ordered are  listed, but only abnormal results are displayed) Labs Reviewed  RESP PANEL BY RT-PCR (RSV, FLU A&B, COVID)  [id number] - Abnormal; Notable for the following components:      Result Value   SARS Coronavirus 2 by RT PCR POSITIVE (*)    All other components within normal limits  SARS CORONAVIRUS 2 BY RT PCR - Abnormal; Notable for the following components:   SARS Coronavirus 2 by RT PCR POSITIVE (*)    All other components within normal limits  BASIC METABOLIC PANEL - Abnormal; Notable for the following components:   Potassium 2.7 (*)    Glucose, Bld 133 (*)    All other components within normal limits  CBC - Abnormal; Notable for the following components:   WBC 17.0 (*)    Hemoglobin 11.1 (*)    Platelets 486 (*)    All other components within normal limits    EKG EKG Interpretation Date/Time:  Sunday August 11 2023 09:30:44 EDT Ventricular Rate:  118 PR Interval:  125 QRS Duration:  72 QT Interval:  317 QTC Calculation: 445 R Axis:   50  Text Interpretation: Sinus tachycardia Multiform ventricular premature complexes Borderline repolarization abnormality No significant change since last tracing Otherwise no significant change Confirmed by Melene Plan  [phone]) on 08/11/2023 11:28:57 AM  Radiology DG Chest Port 1 View  Result Date: 08/11/2023 CLINICAL DATA:  62 year old female with history of shortness of breath. EXAM: PORTABLE CHEST 1 VIEW COMPARISON:  Chest x-ray 08/05/2023. FINDINGS: Lung volumes are normal. No consolidative airspace disease. No pleural effusions. No pneumothorax. No pulmonary nodule or mass noted. Pulmonary vasculature and the cardiomediastinal silhouette are within normal limits. Atherosclerosis in the thoracic aorta. Orthopedic fixation hardware in the cervical spine incidentally noted. IMPRESSION: 1.  No radiographic evidence of acute cardiopulmonary disease. 2. Aortic atherosclerosis. Electronically Signed   By: Trudie Reed M.D.   On: 08/11/2023 10:58    Procedures Procedures    Medications Ordered in ED Medications  potassium chloride SA (KLOR-CON M) CR tablet 40 mEq (has no administration in time range)  ipratropium (ATROVENT) nebulizer solution 0.5 mg (0.5 mg Nebulization Given 08/11/23 0946)  albuterol (PROVENTIL) (2.5 MG/3ML) 0.083% nebulizer solution 10 mg (2.5 mg Nebulization Given 08/11/23 0947)    ED Course/ Medical Decision Making/ A&P                                 Medical Decision Making Amount and/or Complexity of Data Reviewed Labs: ordered. Radiology: ordered.  Risk Prescription drug management.   Initial Impression and Ddx 62 year old well-appearing, nontoxic female presenting for shortness of breath in setting of COVID infection.  Exam notable for diminished breath sounds and wheezing.  DDx includes COPD/asthma exacerbation, pneumonia, COVID, respiratory failure, sepsis, PE. Patient PMH that increases complexity of ED encounter:   asthma, hypertension and hyperlipidemia, h/o stroke  Interpretation of Diagnostics - I independent reviewed and interpreted the labs as followed: Hypokalemia, COVID-positive, leukocytosis  - I independently visualized the following imaging with scope of  interpretation limited to determining acute life threatening conditions related to emergency care: CXR, which revealed no acute findings  -I personally reviewed and interpreted the EKG which revealed sinus tachycardia, QTc 445  Patient Reassessment and Ultimate Disposition/Management After treatment, patient stated she was feeling much better and sounded like she was moving air more efficiently per auscultation.  Noted to be hypokalemia. Suspect likely due to multiple treatments of albuterol.  Repleted with oral potassium.  Per review of her chart, patient was on a course of oral steroids after her last encounter on August 19.  Suspects COPD exacerbation likely predicated by ongoing COVID infection.  Advised continued conservative treatment at home and to continue taking the Paxlovid as prescribed.  Discussed return precautions.  Advise follow-up with PCP for recheck of her blood work in few days.  Vital stable.  Discharged home good condition.  Patient management required discussion with the following services or consulting groups:  None  Complexity of Problems Addressed Acute complicated illness or Injury  Additional Data Reviewed and Analyzed Further history obtained from: Past medical history and medications listed in the EMR, Prior ED visit notes, and Recent discharge summary  Patient Encounter Risk Assessment None         Final Clinical Impression(s) / ED Diagnoses Final diagnoses:  COPD exacerbation (HCC)  COVID    Rx / DC Orders ED Discharge Orders     None         Gareth Eagle, PA-C 08/11/23 1302    Melene Plan, DO 08/11/23 1314

## 2023-08-11 NOTE — Progress Notes (Signed)
   08/11/23 0950  BiPAP/CPAP/SIPAP  Reason BIPAP/CPAP not in use (S)  Nausea/Vomiting (Pt complained of nausea and refused BiPAP)

## 2023-08-11 NOTE — ED Triage Notes (Signed)
Pt was dc'd here earlier today for COVID+ and COPD exacerbation. She tried albuterol at home and it did not help. She has audible wheezes. She is A&Ox4, ambulatory. Coming from home.   EMS gave... 2 duonebs and 2G of mag and 125mg  of solumedrol.   20G RAC

## 2023-08-12 ENCOUNTER — Encounter (HOSPITAL_COMMUNITY): Payer: Self-pay | Admitting: Internal Medicine

## 2023-08-12 ENCOUNTER — Emergency Department (HOSPITAL_COMMUNITY): Payer: Medicare HMO

## 2023-08-12 DIAGNOSIS — U071 COVID-19: Secondary | ICD-10-CM | POA: Diagnosis not present

## 2023-08-12 DIAGNOSIS — K219 Gastro-esophageal reflux disease without esophagitis: Secondary | ICD-10-CM

## 2023-08-12 DIAGNOSIS — D509 Iron deficiency anemia, unspecified: Secondary | ICD-10-CM | POA: Diagnosis not present

## 2023-08-12 DIAGNOSIS — R4182 Altered mental status, unspecified: Secondary | ICD-10-CM | POA: Diagnosis not present

## 2023-08-12 DIAGNOSIS — Z8673 Personal history of transient ischemic attack (TIA), and cerebral infarction without residual deficits: Secondary | ICD-10-CM | POA: Diagnosis not present

## 2023-08-12 DIAGNOSIS — F060 Psychotic disorder with hallucinations due to known physiological condition: Secondary | ICD-10-CM | POA: Diagnosis not present

## 2023-08-12 DIAGNOSIS — Z87898 Personal history of other specified conditions: Secondary | ICD-10-CM | POA: Diagnosis not present

## 2023-08-12 DIAGNOSIS — G9340 Encephalopathy, unspecified: Secondary | ICD-10-CM | POA: Diagnosis not present

## 2023-08-12 DIAGNOSIS — E876 Hypokalemia: Secondary | ICD-10-CM

## 2023-08-12 DIAGNOSIS — E669 Obesity, unspecified: Secondary | ICD-10-CM | POA: Diagnosis present

## 2023-08-12 DIAGNOSIS — Z7951 Long term (current) use of inhaled steroids: Secondary | ICD-10-CM | POA: Diagnosis not present

## 2023-08-12 DIAGNOSIS — J9610 Chronic respiratory failure, unspecified whether with hypoxia or hypercapnia: Secondary | ICD-10-CM | POA: Diagnosis not present

## 2023-08-12 DIAGNOSIS — M797 Fibromyalgia: Secondary | ICD-10-CM | POA: Diagnosis present

## 2023-08-12 DIAGNOSIS — K5900 Constipation, unspecified: Secondary | ICD-10-CM | POA: Diagnosis not present

## 2023-08-12 DIAGNOSIS — E785 Hyperlipidemia, unspecified: Secondary | ICD-10-CM | POA: Diagnosis not present

## 2023-08-12 DIAGNOSIS — K7460 Unspecified cirrhosis of liver: Secondary | ICD-10-CM | POA: Diagnosis not present

## 2023-08-12 DIAGNOSIS — G40909 Epilepsy, unspecified, not intractable, without status epilepticus: Secondary | ICD-10-CM | POA: Diagnosis not present

## 2023-08-12 DIAGNOSIS — J441 Chronic obstructive pulmonary disease with (acute) exacerbation: Secondary | ICD-10-CM | POA: Diagnosis present

## 2023-08-12 DIAGNOSIS — T380X5A Adverse effect of glucocorticoids and synthetic analogues, initial encounter: Secondary | ICD-10-CM | POA: Diagnosis not present

## 2023-08-12 DIAGNOSIS — E7800 Pure hypercholesterolemia, unspecified: Secondary | ICD-10-CM

## 2023-08-12 DIAGNOSIS — F319 Bipolar disorder, unspecified: Secondary | ICD-10-CM | POA: Diagnosis not present

## 2023-08-12 DIAGNOSIS — I10 Essential (primary) hypertension: Secondary | ICD-10-CM | POA: Diagnosis not present

## 2023-08-12 DIAGNOSIS — J449 Chronic obstructive pulmonary disease, unspecified: Secondary | ICD-10-CM | POA: Diagnosis not present

## 2023-08-12 DIAGNOSIS — R7401 Elevation of levels of liver transaminase levels: Secondary | ICD-10-CM | POA: Diagnosis present

## 2023-08-12 DIAGNOSIS — Z7952 Long term (current) use of systemic steroids: Secondary | ICD-10-CM | POA: Diagnosis not present

## 2023-08-12 DIAGNOSIS — Z882 Allergy status to sulfonamides status: Secondary | ICD-10-CM | POA: Diagnosis not present

## 2023-08-12 DIAGNOSIS — Z888 Allergy status to other drugs, medicaments and biological substances status: Secondary | ICD-10-CM | POA: Diagnosis not present

## 2023-08-12 DIAGNOSIS — I739 Peripheral vascular disease, unspecified: Secondary | ICD-10-CM | POA: Diagnosis not present

## 2023-08-12 DIAGNOSIS — R739 Hyperglycemia, unspecified: Secondary | ICD-10-CM | POA: Diagnosis present

## 2023-08-12 DIAGNOSIS — Z6832 Body mass index (BMI) 32.0-32.9, adult: Secondary | ICD-10-CM | POA: Diagnosis not present

## 2023-08-12 DIAGNOSIS — Z79899 Other long term (current) drug therapy: Secondary | ICD-10-CM | POA: Diagnosis not present

## 2023-08-12 DIAGNOSIS — R410 Disorientation, unspecified: Secondary | ICD-10-CM | POA: Diagnosis not present

## 2023-08-12 DIAGNOSIS — I700 Atherosclerosis of aorta: Secondary | ICD-10-CM | POA: Diagnosis not present

## 2023-08-12 DIAGNOSIS — G4733 Obstructive sleep apnea (adult) (pediatric): Secondary | ICD-10-CM | POA: Diagnosis present

## 2023-08-12 DIAGNOSIS — Z87891 Personal history of nicotine dependence: Secondary | ICD-10-CM | POA: Diagnosis not present

## 2023-08-12 LAB — CBC WITH DIFFERENTIAL/PLATELET
Abs Immature Granulocytes: 0.23 10*3/uL — ABNORMAL HIGH (ref 0.00–0.07)
Abs Immature Granulocytes: 0.18 10*3/uL — ABNORMAL HIGH (ref 0.00–0.07)
Basophils Absolute: 0 10*3/uL (ref 0.0–0.1)
Basophils Absolute: 0 10*3/uL (ref 0.0–0.1)
Basophils Relative: 0 %
Basophils Relative: 0 %
Eosinophils Absolute: 0 10*3/uL (ref 0.0–0.5)
Eosinophils Absolute: 0 10*3/uL (ref 0.0–0.5)
Eosinophils Relative: 0 %
Eosinophils Relative: 0 %
HCT: 33.5 % — ABNORMAL LOW (ref 36.0–46.0)
HCT: 29.6 % — ABNORMAL LOW (ref 36.0–46.0)
Hemoglobin: 10.2 g/dL — ABNORMAL LOW (ref 12.0–15.0)
Hemoglobin: 9.3 g/dL — ABNORMAL LOW (ref 12.0–15.0)
Immature Granulocytes: 2 %
Immature Granulocytes: 2 %
Lymphocytes Relative: 12 %
Lymphocytes Relative: 9 %
Lymphs Abs: 1.5 10*3/uL (ref 0.7–4.0)
Lymphs Abs: 1 10*3/uL (ref 0.7–4.0)
MCH: 26 pg (ref 26.0–34.0)
MCH: 27.2 pg (ref 26.0–34.0)
MCHC: 30.4 g/dL (ref 30.0–36.0)
MCHC: 31.4 g/dL (ref 30.0–36.0)
MCV: 85.2 fL (ref 80.0–100.0)
MCV: 86.5 fL (ref 80.0–100.0)
Monocytes Absolute: 0.3 10*3/uL (ref 0.1–1.0)
Monocytes Absolute: 0.3 10*3/uL (ref 0.1–1.0)
Monocytes Relative: 2 %
Monocytes Relative: 3 %
Neutro Abs: 11 10*3/uL — ABNORMAL HIGH (ref 1.7–7.7)
Neutro Abs: 10.2 10*3/uL — ABNORMAL HIGH (ref 1.7–7.7)
Neutrophils Relative %: 84 %
Neutrophils Relative %: 86 %
Platelets: 512 10*3/uL — ABNORMAL HIGH (ref 150–400)
Platelets: 427 10*3/uL — ABNORMAL HIGH (ref 150–400)
RBC: 3.93 MIL/uL (ref 3.87–5.11)
RBC: 3.42 MIL/uL — ABNORMAL LOW (ref 3.87–5.11)
RDW: 15.3 % (ref 11.5–15.5)
RDW: 15.2 % (ref 11.5–15.5)
WBC: 13.1 10*3/uL — ABNORMAL HIGH (ref 4.0–10.5)
WBC: 11.8 10*3/uL — ABNORMAL HIGH (ref 4.0–10.5)
nRBC: 0 % (ref 0.0–0.2)
nRBC: 0 % (ref 0.0–0.2)

## 2023-08-12 LAB — BASIC METABOLIC PANEL
Anion gap: 18 — ABNORMAL HIGH (ref 5–15)
BUN: 16 mg/dL (ref 6–20)
CO2: 22 mmol/L (ref 22–32)
Calcium: 9.5 mg/dL (ref 8.9–10.3)
Chloride: 99 mmol/L (ref 98–111)
Creatinine, Ser: 1.06 mg/dL — ABNORMAL HIGH (ref 0.44–1.00)
GFR, Estimated: >60 mL/min (ref >60)
Glucose, Bld: 346 mg/dL — ABNORMAL HIGH (ref 70–99)
Potassium: 3.3 mmol/L — ABNORMAL LOW (ref 3.5–5.1)
Sodium: 139 mmol/L (ref 135–145)

## 2023-08-12 LAB — COMPREHENSIVE METABOLIC PANEL
ALT: 88 U/L — ABNORMAL HIGH (ref 0–44)
AST: 50 U/L — ABNORMAL HIGH (ref 15–41)
Albumin: 3.4 g/dL — ABNORMAL LOW (ref 3.5–5.0)
Alkaline Phosphatase: 60 U/L (ref 38–126)
Anion gap: 12 (ref 5–15)
BUN: 14 mg/dL (ref 6–20)
CO2: 25 mmol/L (ref 22–32)
Calcium: 8.5 mg/dL — ABNORMAL LOW (ref 8.9–10.3)
Chloride: 99 mmol/L (ref 98–111)
Creatinine, Ser: 0.9 mg/dL (ref 0.44–1.00)
GFR, Estimated: >60 mL/min (ref >60)
Glucose, Bld: 276 mg/dL — ABNORMAL HIGH (ref 70–99)
Potassium: 3.1 mmol/L — ABNORMAL LOW (ref 3.5–5.1)
Sodium: 136 mmol/L (ref 135–145)
Total Bilirubin: 0.5 mg/dL (ref 0.3–1.2)
Total Protein: 6 g/dL — ABNORMAL LOW (ref 6.5–8.1)

## 2023-08-12 LAB — PHOSPHORUS: Phosphorus: 1.9 mg/dL — ABNORMAL LOW (ref 2.5–4.6)

## 2023-08-12 LAB — C-REACTIVE PROTEIN: CRP: 0.6 mg/dL (ref <1.0)

## 2023-08-12 LAB — I-STAT VENOUS BLOOD GAS, ED
Acid-Base Excess: 3 mmol/L — ABNORMAL HIGH (ref 0.0–2.0)
Bicarbonate: 24.7 mmol/L (ref 20.0–28.0)
Calcium, Ion: 0.96 mmol/L — ABNORMAL LOW (ref 1.15–1.40)
HCT: 27 % — ABNORMAL LOW (ref 36.0–46.0)
Hemoglobin: 9.2 g/dL — ABNORMAL LOW (ref 12.0–15.0)
O2 Saturation: 98 %
Potassium: 4.2 mmol/L (ref 3.5–5.1)
Sodium: 137 mmol/L (ref 135–145)
TCO2: 25 mmol/L (ref 22–32)
pCO2, Ven: 25.4 mmHg — ABNORMAL LOW (ref 44–60)
pH, Ven: 7.595 — ABNORMAL HIGH (ref 7.25–7.43)
pO2, Ven: 83 mmHg — ABNORMAL HIGH (ref 32–45)

## 2023-08-12 LAB — URINALYSIS, COMPLETE (UACMP) WITH MICROSCOPIC
Bacteria, UA: NONE SEEN
Bilirubin Urine: NEGATIVE
Glucose, UA: >=500 mg/dL — AB
Hgb urine dipstick: NEGATIVE
Ketones, ur: NEGATIVE mg/dL
Leukocytes,Ua: NEGATIVE
Nitrite: NEGATIVE
Protein, ur: NEGATIVE mg/dL
Specific Gravity, Urine: 1.027 (ref 1.005–1.030)
pH: 5 (ref 5.0–8.0)

## 2023-08-12 LAB — I-STAT CG4 LACTIC ACID, ED: Lactic Acid, Venous: 4.5 mmol/L (ref 0.5–1.9)

## 2023-08-12 LAB — GLUCOSE, CAPILLARY
Glucose-Capillary: 227 mg/dL — ABNORMAL HIGH (ref 70–99)
Glucose-Capillary: 199 mg/dL — ABNORMAL HIGH (ref 70–99)
Glucose-Capillary: 257 mg/dL — ABNORMAL HIGH (ref 70–99)
Glucose-Capillary: 258 mg/dL — ABNORMAL HIGH (ref 70–99)

## 2023-08-12 LAB — MAGNESIUM: Magnesium: 2.4 mg/dL (ref 1.7–2.4)

## 2023-08-12 LAB — PROCALCITONIN: Procalcitonin: <0.1 ng/mL

## 2023-08-12 MED ORDER — FLUTICASONE PROPIONATE 50 MCG/ACT NA SUSP: 1.0000 | Freq: Two times a day (BID) | NASAL | Status: DC | PRN

## 2023-08-12 MED ORDER — HYDROCHLOROTHIAZIDE 12.5 MG PO TABS
12.5000 mg | ORAL_TABLET | Freq: Every day | ORAL | Status: DC
  Administered 2023-08-12 – 2023-08-14 (×3): 12.5 mg via ORAL
  Filled 2023-08-12 (×3): qty 1

## 2023-08-12 MED ORDER — ACETAMINOPHEN 650 MG RE SUPP: 650.0000 mg | Freq: Four times a day (QID) | RECTAL | Status: DC | PRN

## 2023-08-12 MED ORDER — ALBUTEROL SULFATE (2.5 MG/3ML) 0.083% IN NEBU
2.5000 mg | INHALATION_SOLUTION | RESPIRATORY_TRACT | Status: DC | PRN
  Administered 2023-08-13 – 2023-08-19 (×3): 2.5 mg via RESPIRATORY_TRACT
  Filled 2023-08-12 (×3): qty 3

## 2023-08-12 MED ORDER — LORATADINE 10 MG PO TABS
10.0000 mg | ORAL_TABLET | Freq: Every day | ORAL | Status: DC
  Administered 2023-08-12 – 2023-08-20 (×9): 10 mg via ORAL
  Filled 2023-08-12 (×9): qty 1

## 2023-08-12 MED ORDER — HYDROXYZINE HCL 25 MG PO TABS
25.0000 mg | ORAL_TABLET | Freq: Two times a day (BID) | ORAL | Status: DC
  Administered 2023-08-12 – 2023-08-14 (×5): 25 mg via ORAL
  Filled 2023-08-12 (×5): qty 1

## 2023-08-12 MED ORDER — ARIPIPRAZOLE 5 MG PO TABS
10.0000 mg | ORAL_TABLET | Freq: Every day | ORAL | Status: DC
  Administered 2023-08-12 – 2023-08-15 (×4): 10 mg via ORAL
  Filled 2023-08-12 (×4): qty 2

## 2023-08-12 MED ORDER — ORAL CARE MOUTH RINSE: 15.0000 mL | OROMUCOSAL | Status: DC | PRN

## 2023-08-12 MED ORDER — HYDROXYZINE HCL 25 MG PO TABS
25.0000 mg | ORAL_TABLET | Freq: Two times a day (BID) | ORAL | Status: DC | PRN
  Administered 2023-08-13: 25 mg via ORAL
  Filled 2023-08-12: qty 1

## 2023-08-12 MED ORDER — SODIUM CHLORIDE 0.9 % IV SOLN
500.0000 mg | Freq: Every day | INTRAVENOUS | Status: DC
  Administered 2023-08-12 – 2023-08-14 (×4): 500 mg via INTRAVENOUS
  Filled 2023-08-12 (×4): qty 5

## 2023-08-12 MED ORDER — PANTOPRAZOLE SODIUM 40 MG PO TBEC
40.0000 mg | DELAYED_RELEASE_TABLET | Freq: Two times a day (BID) | ORAL | Status: DC
  Administered 2023-08-12 – 2023-08-20 (×18): 40 mg via ORAL
  Filled 2023-08-12 (×18): qty 1

## 2023-08-12 MED ORDER — IRBESARTAN 75 MG PO TABS
75.0000 mg | ORAL_TABLET | Freq: Every day | ORAL | Status: DC
  Administered 2023-08-12 – 2023-08-14 (×3): 75 mg via ORAL
  Filled 2023-08-12 (×3): qty 1

## 2023-08-12 MED ORDER — CARBAMAZEPINE 200 MG PO TABS
400.0000 mg | ORAL_TABLET | Freq: Every day | ORAL | Status: DC
  Administered 2023-08-12 – 2023-08-20 (×8): 400 mg via ORAL
  Filled 2023-08-12 (×9): qty 2

## 2023-08-12 MED ORDER — CARBAMAZEPINE 200 MG PO TABS
600.0000 mg | ORAL_TABLET | Freq: Every day | ORAL | Status: DC
  Administered 2023-08-12 – 2023-08-19 (×8): 600 mg via ORAL
  Filled 2023-08-12 (×10): qty 3

## 2023-08-12 MED ORDER — MELATONIN 3 MG PO TABS: 3.0000 mg | ORAL_TABLET | Freq: Every evening | ORAL | Status: DC | PRN

## 2023-08-12 MED ORDER — ENOXAPARIN SODIUM 40 MG/0.4ML IJ SOSY
40.0000 mg | PREFILLED_SYRINGE | Freq: Every day | INTRAMUSCULAR | Status: DC
  Administered 2023-08-12 – 2023-08-20 (×9): 40 mg via SUBCUTANEOUS
  Filled 2023-08-12 (×9): qty 0.4

## 2023-08-12 MED ORDER — FAMOTIDINE 20 MG PO TABS
40.0000 mg | ORAL_TABLET | Freq: Every day | ORAL | Status: DC
  Administered 2023-08-12 – 2023-08-19 (×9): 40 mg via ORAL
  Filled 2023-08-12 (×9): qty 2

## 2023-08-12 MED ORDER — BUDESON-GLYCOPYRROL-FORMOTEROL 160-9-4.8 MCG/ACT IN AERO: 2.0000 | INHALATION_SPRAY | Freq: Two times a day (BID) | RESPIRATORY_TRACT | Status: DC

## 2023-08-12 MED ORDER — LACTATED RINGERS IV SOLN: INTRAVENOUS | Status: DC

## 2023-08-12 MED ORDER — CARBAMAZEPINE 200 MG PO TABS: 400.0000 mg | ORAL_TABLET | ORAL | Status: DC

## 2023-08-12 MED ORDER — GABAPENTIN 100 MG PO CAPS
100.0000 mg | ORAL_CAPSULE | Freq: Three times a day (TID) | ORAL | Status: DC
  Administered 2023-08-12 – 2023-08-14 (×9): 100 mg via ORAL
  Filled 2023-08-12 (×9): qty 1

## 2023-08-12 MED ORDER — IPRATROPIUM-ALBUTEROL 0.5-2.5 (3) MG/3ML IN SOLN
3.0000 mL | Freq: Four times a day (QID) | RESPIRATORY_TRACT | Status: DC
  Administered 2023-08-12 (×3): 3 mL via RESPIRATORY_TRACT
  Filled 2023-08-12 (×3): qty 3

## 2023-08-12 MED ORDER — INSULIN ASPART 100 UNIT/ML IJ SOLN
0.0000 [IU] | Freq: Three times a day (TID) | INTRAMUSCULAR | Status: DC
  Administered 2023-08-12: 5 [IU] via SUBCUTANEOUS
  Administered 2023-08-12 – 2023-08-13 (×4): 3 [IU] via SUBCUTANEOUS
  Administered 2023-08-14: 2 [IU] via SUBCUTANEOUS
  Administered 2023-08-14: 1 [IU] via SUBCUTANEOUS
  Administered 2023-08-14: 2 [IU] via SUBCUTANEOUS
  Administered 2023-08-15 (×2): 1 [IU] via SUBCUTANEOUS
  Administered 2023-08-16: 2 [IU] via SUBCUTANEOUS
  Administered 2023-08-16: 1 [IU] via SUBCUTANEOUS
  Administered 2023-08-17: 2 [IU] via SUBCUTANEOUS
  Administered 2023-08-17 – 2023-08-18 (×2): 1 [IU] via SUBCUTANEOUS
  Administered 2023-08-18 (×2): 2 [IU] via SUBCUTANEOUS
  Administered 2023-08-19 (×2): 1 [IU] via SUBCUTANEOUS

## 2023-08-12 MED ORDER — METHYLPREDNISOLONE SODIUM SUCC 125 MG IJ SOLR
80.0000 mg | Freq: Two times a day (BID) | INTRAMUSCULAR | Status: DC
  Administered 2023-08-12 – 2023-08-14 (×6): 80 mg via INTRAVENOUS
  Filled 2023-08-12 (×6): qty 2

## 2023-08-12 MED ORDER — IPRATROPIUM-ALBUTEROL 0.5-2.5 (3) MG/3ML IN SOLN
3.0000 mL | Freq: Four times a day (QID) | RESPIRATORY_TRACT | Status: DC | PRN
  Administered 2023-08-12: 3 mL via RESPIRATORY_TRACT
  Filled 2023-08-12: qty 3

## 2023-08-12 MED ORDER — SODIUM CHLORIDE 0.9 % IV BOLUS
1000.0000 mL | Freq: Once | INTRAVENOUS | Status: AC
  Administered 2023-08-12: 1000 mL via INTRAVENOUS

## 2023-08-12 MED ORDER — ACETAMINOPHEN 325 MG PO TABS: 650.0000 mg | ORAL_TABLET | Freq: Four times a day (QID) | ORAL | Status: DC | PRN

## 2023-08-12 MED ORDER — HYDROCHLOROTHIAZIDE 12.5 MG PO CAPS
12.5000 mg | ORAL_CAPSULE | Freq: Every day | ORAL | Status: DC
  Filled 2023-08-12 (×3): qty 1

## 2023-08-12 MED ORDER — MONTELUKAST SODIUM 10 MG PO TABS
10.0000 mg | ORAL_TABLET | Freq: Every day | ORAL | Status: DC
  Administered 2023-08-12 – 2023-08-19 (×9): 10 mg via ORAL
  Filled 2023-08-12 (×9): qty 1

## 2023-08-12 MED ORDER — K PHOS MONO-SOD PHOS DI & MONO 155-852-130 MG PO TABS: 500.0000 mg | ORAL_TABLET | Freq: Three times a day (TID) | ORAL | Status: DC

## 2023-08-12 MED ORDER — ATORVASTATIN CALCIUM 40 MG PO TABS
40.0000 mg | ORAL_TABLET | Freq: Every day | ORAL | Status: DC
  Administered 2023-08-12: 40 mg via ORAL
  Filled 2023-08-12: qty 1

## 2023-08-12 MED ORDER — POTASSIUM CHLORIDE CRYS ER 20 MEQ PO TBCR
40.0000 meq | EXTENDED_RELEASE_TABLET | Freq: Once | ORAL | Status: AC
  Administered 2023-08-12: 40 meq via ORAL
  Filled 2023-08-12: qty 2

## 2023-08-12 MED ORDER — ONDANSETRON HCL 4 MG/2ML IJ SOLN
4.0000 mg | Freq: Four times a day (QID) | INTRAMUSCULAR | Status: DC | PRN
  Administered 2023-08-14 – 2023-08-16 (×3): 4 mg via INTRAVENOUS
  Filled 2023-08-12 (×3): qty 2

## 2023-08-12 MED ORDER — K PHOS MONO-SOD PHOS DI & MONO 155-852-130 MG PO TABS
500.0000 mg | ORAL_TABLET | Freq: Three times a day (TID) | ORAL | Status: AC
  Administered 2023-08-12 – 2023-08-13 (×3): 500 mg via ORAL
  Filled 2023-08-12 (×3): qty 2

## 2023-08-12 MED ORDER — FERROUS SULFATE 325 (65 FE) MG PO TABS
325.0000 mg | ORAL_TABLET | Freq: Every day | ORAL | Status: DC
  Administered 2023-08-12 – 2023-08-20 (×9): 325 mg via ORAL
  Filled 2023-08-12 (×9): qty 1

## 2023-08-12 MED ORDER — MOMETASONE FURO-FORMOTEROL FUM 200-5 MCG/ACT IN AERO
2.0000 | INHALATION_SPRAY | Freq: Two times a day (BID) | RESPIRATORY_TRACT | Status: DC
  Administered 2023-08-12 – 2023-08-20 (×17): 2 via RESPIRATORY_TRACT
  Filled 2023-08-12 (×2): qty 8.8

## 2023-08-12 NOTE — Assessment & Plan Note (Signed)
Resolved with supplementation and starting spironolactone. 

## 2023-08-12 NOTE — Plan of Care (Signed)
  Problem: Education: Goal: Knowledge of risk factors and measures for prevention of condition will improve Outcome: Progressing   Problem: Respiratory: Goal: Will maintain a patent airway Outcome: Progressing   Problem: Respiratory: Goal: Complications related to the disease process, condition or treatment will be avoided or minimized Outcome: Progressing   

## 2023-08-12 NOTE — Assessment & Plan Note (Addendum)
Very tight and wheezing on exam.  No hypoxia at all, but reports only able to take a few steps due to dyspnea.  Sepsis ruled out.  Lactic acid elevation due to albuterol use in respiratory disease, clinically obviously not sepsis.  Admitting physician did also not treat sepsis with appropriate fluids if the working diagnosis had been sepsis. - Continue solumedrol - Continue azithromycin - Med rec not completed by admitting MD, will restart home ICS/LABA/LAMA, Zyrtec and Flonase - Continue Singulair - Flutter, IS - Continue PPI, Pepcid

## 2023-08-12 NOTE — H&P (Signed)
History and Physical      [name] ZDG:[id number] DOB: 1961/05/30 DOA: 08/11/2023; DOS: 08/12/2023  PCP: Corwin Levins, MD  Patient coming from: home   I have personally briefly reviewed patient's old medical records in [facility]  Chief Complaint: Shortness of breath  HPI: [name] is a 62 y.o. female with medical history significant for COPD, essential hypertension Seizure disorder, chronic iron deficiency anemia associated baseline hemoglobin range 10-12, who is admitted to [facility] on 08/11/2023 with acute COPD exacerbation after presenting from home to [facility] ED complaining of shortness of breath.   The patient conveys that she was diagnosed as an outpatient on 08/06/2023 with COVID-19 infection.  Since that time, she has had a week of progressive shortness of breath associate with increased work of breathing, nonproductive cough.  She denies any associated orthopnea, PND, or worsening of peripheral edema.  Denies any associated new calf tenderness or new lower extremity erythema.  Not associate with any chest pain, nausea, vomiting, palpitations, diaphoresis, dizziness, presyncope, or syncope.  She does continue to convey subjective fever and generalized myalgias in the absence of chills or full body rigors.  Denies any recent headache, neck stiffness, rash, abdominal pain, or diarrhea.  She also denies any recent dysuria or gross hematuria.  She confirms a history of underlying COPD in the setting of being a former smoker.  Per chart review, most recent echocardiogram occurred in March 2024 and was notable for LVEF 6065%, no focal motion normalities, normal left ventricular diastolic parameters and normal right ventricular systolic function.  She notes good compliance with her outpatient respiratory regimen which consists of Breztri inhaler, Singulair.    She has no known history of underlying diabetes, and most recent hemoglobin A1c was  noted to be 5.9% when checked on 06/18/2023.  She had presented to [facility] emergency department on the morning of 08/11/2023 with the above complaints at which time she received Solu-Medrol and duo nebulizer before being discharged to home from the emergency department.  After arriving back home, she noted further progression in her shortness of breath prompting her to use her rescue albuterol inhaler 3 more times.  However, in the setting of no improvement in her shortness of breath, she contacted EMS who subsequent brought the patient back to [facility] emergency department for further evaluation management of the above.  And route to the ED, EMS reportedly administered IV magnesium sulfate as well as provided an additional duo nebulizer treatment.      ED Course:  Vital signs in the ED were notable for the following: Afebrile; rates in the 80s to low 100s; systolic blood pressures in the 120s 140s; respiratory rate 18-25, oxygen saturation 96 to 100% on room air.  Labs were notable for the following: BMP notable for sodium 139, potassium 3.3, bicarbonate 22, creatinine 1.06, glucose 346.  CBC notable for white blood cell count 13,100, hemoglobin 10.2 associated normocytic/normochromic findings as well as nonelevated RDW and relative demonstration prior serum hemoglobin data point of 11.2 on 08/05/2023.  COVID-19 PCR was found to be positive, while influenza AMB as well as RSV PCR were found to be negative.  Per my interpretation, EKG in ED demonstrated the following: Sinus rhythm with heart rate 98, normal intervals, no evidence of T wave or ST changes, Cleen evidence of ST elevation.  Imaging in the ED, per corresponding formal radiology read, was notable for the following: 1 view chest x-ray showed no evidence of  acute cardiopulmonary process, including no evidence of infiltrate, edema, effusion, or pneumothorax.  While in the ED, the following were administered: Albuterol nebulizer,  potassium chloride 40 mill equivalents p.o. x 1 dose, normal saline x 1 L bolus.  Subsequently, the patient was admitted for further evaluation management of acute COPD exacerbation in the setting of COVID-19 infection, with presenting labs notable for hypokalemia as well as hyperglycemia.     Review of Systems: As per HPI otherwise 10 point review of systems negative.   Past Medical History:  Diagnosis Date   Anemia, iron deficiency 12/22/2014   pt. 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      Bipolar disorder (HCC)    with anxiety, depression   COPD (chronic obstructive pulmonary disease) (HCC)    Eczema 05/18/2022   Fibromyalgia 05/14/2014   GERD (gastroesophageal reflux disease)    History of kidney stones    Hyperlipidemia 04/20/2017   HYPERTENSION 05/12/2009   Qualifier: Diagnosis of  By: Truman Hayward Duncan Dull), [name]     Peripheral vascular disease (HCC)    Pneumonia    Prediabetes 02/23/2014   pt. denies   Seizure (HCC)    Stroke (HCC) 11/2020   Urticaria     Past Surgical History:  Procedure Laterality Date   ABDOMINAL HYSTERECTOMY     ANTERIOR CERVICAL DECOMP/DISCECTOMY FUSION N/A 07/28/2020   Procedure: ANTERIOR CERVICAL DECOMPRESSION/DISCECTOMY FUSION. INTERBODY PROTHESIS, PLATE/SCREWS CERVICAL THREE- CERVICAL FOUR, CERVICAL FOUR- CERVICAL FIVE;  Surgeon: Tressie Stalker, MD;  Location: [facility] OR;  Service: Neurosurgery;  Laterality: N/A;   BACK SURGERY     COLONOSCOPY  12/20/2011   Procedure: COLONOSCOPY;  Surgeon: Freddy Jaksch, MD;  Location: [facility] ENDOSCOPY;  Service: Endoscopy;  Laterality: N/A;   COLONOSCOPY  03/05/2012   Procedure: COLONOSCOPY;  Surgeon: Freddy Jaksch, MD;  Location: [facility] ENDOSCOPY;  Service: Endoscopy;  Laterality: N/A;   DIAGNOSTIC LAPAROSCOPY      HEMORRHOID SURGERY     INCISE AND DRAIN ABCESS     KIDNEY STONE SURGERY     NECK SURGERY     x 2 Dr Terrilee Files   SPINE SURGERY  2019   TOE SURGERY     TUBAL LIGATION      Social History:  reports that she quit smoking about 4 years ago. Her smoking use included cigarettes. She started smoking about 40 years ago. She has a 9 pack-year smoking history. She has never used smokeless tobacco. She reports that she does not currently use alcohol. She reports that she does not currently use drugs after having used the following drugs: Marijuana.   Allergies  Allergen Reactions   Sulfa Antibiotics Nausea And Vomiting and Other (See Comments)   Venofer [Iron Sucrose] Hives    Family History  Problem Relation Age of Onset   Diabetes Mother    COPD Mother    Heart disease Mother    Asthma Mother    Diabetes Father    Kidney disease Father    Anesthesia problems Father    Alcohol abuse Father    Colon cancer Father 68   Diabetes Sister    Hypertension Sister    Heart disease Sister    Diabetes Sister    Brain cancer Sister    Hypertension Sister    Asthma Sister  Diabetes Sister    COPD Sister    Hypertension Sister    Breast cancer Sister 2   Anemia Sister    Hypertension Sister    Diabetes Sister    Anemia Sister    Hypertension Sister    Diabetes Brother    Sleep apnea Brother    Asthma Brother    Alcohol abuse Brother    Heart disease Brother    Heart disease Brother    Hypertension Brother    Hypertension Daughter    Anemia Daughter    Allergic rhinitis Neg Hx    Eczema Neg Hx    Urticaria Neg Hx    Esophageal cancer Neg Hx    Prostate cancer Neg Hx    Rectal cancer Neg Hx     Family history reviewed and not pertinent    Prior to Admission medications   Medication Sig Start Date End Date Taking? Authorizing Provider  albuterol (VENTOLIN HFA) 108 (90 Base) MCG/ACT inhaler Inhale 2 puffs into the lungs every 6 (six) hours as needed for wheezing or  shortness of breath. 06/24/23  Yes Shah, Pratik D, DO  ARIPiprazole (ABILIFY) 10 MG tablet Take 10 mg by mouth daily. 05/02/23  Yes [provider]  atorvastatin (LIPITOR) 40 MG tablet Take 1 tablet (40 mg total) by mouth daily. TAKE 1 TABLET(40 MG) BY MOUTH DAILY Strength: 40 mg Patient taking differently: Take 40 mg by mouth daily. 05/23/22  Yes Corwin Levins, MD  azithromycin (ZITHROMAX) 250 MG tablet Take 2 tablets on day 1, then 1 tablet daily on days 2 through 5 08/07/23 08/12/23 Yes McElwee, Lauren A, NP  Budeson-Glycopyrrol-Formoterol (BREZTRI AEROSPHERE) 160-9-4.8 MCG/ACT AERO Inhale 2 puffs into the lungs in the morning and at bedtime. 06/24/23  Yes Shah, Pratik D, DO  budesonide (PULMICORT) 0.5 MG/2ML nebulizer solution Take 0.5 mg by nebulization daily. 05/20/23  Yes [provider]  carbamazepine (TEGRETOL) 200 MG tablet Take 2-3 tablets (400-600 mg total) by mouth See admin instructions. Take 400mg  in the morning and 600mg  at bedtime. 02/06/23  Yes Plovsky, Earvin Hansen, MD  cetirizine (ZYRTEC) 10 MG tablet Can take one tablet by mouth once daily if needed for runny nose. 02/28/23  Yes Alfonse Spruce, MD  cyclobenzaprine (FLEXERIL) 5 MG tablet Take 5 mg by mouth 3 (three) times daily as needed. 05/19/23  Yes [provider]  DUPIXENT 300 MG/2ML prefilled syringe Inject 300 mg into the skin every 14 (fourteen) days. Every other Wednesday 05/22/23  Yes [provider]  famotidine (PEPCID) 40 MG tablet TAKE 1 TABLET(40 MG) BY MOUTH AT BEDTIME 07/02/23  Yes Glenford Bayley, NP  ferrous sulfate 325 (65 FE) MG EC tablet Take 1 tablet (325 mg total) by mouth daily with breakfast. 04/08/23  Yes Corwin Levins, MD  fluticasone [facility]) 50 MCG/ACT nasal spray Place 1 spray into both nostrils 2 (two) times daily as needed for allergies or rhinitis. 02/28/23  Yes Alfonse Spruce, MD  formoterol (PERFOROMIST) 20 MCG/2ML nebulizer solution Take 20 mcg by nebulization 2 (two)  times daily. 07/23/23  Yes [provider]  furosemide (LASIX) 20 MG tablet 1 tab by mouth once daily as needed Patient taking differently: Take 20 mg by mouth daily as needed for edema. 06/18/23  Yes Corwin Levins, MD  gabapentin (NEURONTIN) 100 MG capsule Take 1 capsule (100 mg total) by mouth 3 (three) times daily. 12/21/22  Yes Plovsky, Earvin Hansen, MD  hydrochlorothiazide (MICROZIDE) 12.5 MG capsule Take 1  capsule (12.5 mg total) by mouth daily. TAKE 1 CAPSULE(12.5 MG) BY MOUTH DAILY Strength: 12.5 mg Patient taking differently: Take 12.5 mg by mouth daily. 05/23/22  Yes Corwin Levins, MD  hydrOXYzine (ATARAX) 25 MG tablet Take 25 mg by mouth 2 (two) times daily. 07/24/23  Yes [provider]  hydrOXYzine (VISTARIL) 25 MG capsule Take 25 mg by mouth 2 (two) times daily as needed. 05/02/23  Yes [provider]  ipratropium (ATROVENT HFA) 17 MCG/ACT inhaler 2 puffs every 4-6 hours as needed. 02/28/23  Yes Alfonse Spruce, MD  ipratropium-albuterol (DUONEB) 0.5-2.5 (3) MG/3ML SOLN Take 3 mLs by nebulization 2 (two) times daily. 01/15/23  Yes Kathlen Mody, MD  irbesartan (AVAPRO) 75 MG tablet TAKE 1 TABLET(75 MG) BY MOUTH DAILY Patient taking differently: Take 75 mg by mouth daily. 11/06/21  Yes Corwin Levins, MD  montelukast (SINGULAIR) 10 MG tablet Take 1 tablet (10 mg total) by mouth at bedtime. 02/28/23  Yes Alfonse Spruce, MD  nirmatrelvir/ritonavir (PAXLOVID) 20 x 150 MG & 10 x 100MG  TABS Take 3 tablets by mouth 2 (two) times daily for 5 days. Patient GFR is normal. Take nirmatrelvir (150 mg) two tablets twice daily for 5 days and ritonavir (100 mg) one tablet twice daily for 5 days. 08/07/23 08/12/23 Yes Alfonse Spruce, MD  pantoprazole (PROTONIX) 40 MG tablet TAKE 1 TABLET(40 MG) BY MOUTH TWICE DAILY 07/02/23  Yes Glenford Bayley, NP  Skin Protectants, Misc. (EUCERIN) cream Apply 1 Application topically daily as needed for dry skin.   Yes [provider]   tacrolimus (PROTOPIC) 0.03 % ointment Apply 1 Application topically 2 (two) times daily as needed (rash). Patient taking differently: Apply 1 Application topically 2 (two) times daily as needed (recurrent rash). 02/28/23  Yes Alfonse Spruce, MD  triamcinolone ointment (KENALOG) 0.1 % Apply 1 Application topically at bedtime. 02/28/23  Yes Alfonse Spruce, MD  Albuterol-Budesonide [facility]) 90-80 MCG/ACT AERO Inhale 2 puffs into the lungs every 6 (six) hours as needed. 06/18/23   Corwin Levins, MD  azelastine (OPTIVAR) 0.05 % ophthalmic solution Place 1 drop into both eyes 2 (two) times daily. Patient not taking: Reported on 08/12/2023 05/18/22   Corwin Levins, MD  predniSONE (DELTASONE) 20 MG tablet Take 2 tablets (40 mg total) by mouth daily with breakfast. Patient not taking: Reported on 08/12/2023 08/07/23   Gerre Scull, NP     Objective    Physical Exam: Vitals:   08/11/23 2330 08/12/23 0000 08/12/23 0030 08/12/23 0100  BP: (!) 151/139 (!) 149/98 (!) 140/98 (!) 152/94  Pulse: 87 96 94 96  Resp: 19 19 (!) 25 16  Temp:      TempSrc:      SpO2: 100% 98% 96% 100%    General: appears to be stated age; alert, oriented; increased work of breathing noted Skin: warm, dry, no rash Head:  AT/[place] Mouth:  Oral mucosa membranes appear moist, normal dentition Neck: supple; trachea midline Heart:  RRR; did not appreciate any M/R/G Lungs: CTAB, did not appreciate any wheezes, rales, or rhonchi Abdomen: + BS; soft, ND, NT Vascular: 2+ pedal pulses b/l; 2+ radial pulses b/l Extremities: no peripheral edema, no muscle wasting Neuro: strength and sensation intact in upper and lower extremities b/l     Labs on Admission: I have personally reviewed following labs and imaging studies  CBC: Recent Labs  Lab 08/05/23 1029 08/11/23 0943 08/11/23 2130 08/11/23 2242  WBC 11.2* 17.0*  13.1*  --   NEUTROABS  --   --  11.0*  --   HGB 11.2* 11.1* 10.2* 11.6*  HCT 36.2 36.2 33.5*  34.0*  MCV 86.8 86.8 85.2  --   PLT 434* 486* 512*  --    Basic Metabolic Panel: Recent Labs  Lab 08/05/23 1029 08/11/23 0943 08/11/23 2130 08/11/23 2242  NA 138 141 139 139  K 3.5 2.7* 3.3* 3.4*  CL 102 101 99  --   CO2 24 25 22   --   GLUCOSE 113* 133* 346*  --   BUN <5* 17 16  --   CREATININE 0.80 0.82 1.06*  --   CALCIUM 9.1 9.4 9.5  --    GFR: Estimated Creatinine Clearance: 58.4 mL/min (A) (by C-G formula based on SCr of 1.06 mg/dL (H)). Liver Function Tests: No results for input(s): "AST", "ALT", "ALKPHOS", "BILITOT", "PROT", "ALBUMIN" in the last 168 hours. No results for input(s): "LIPASE", "AMYLASE" in the last 168 hours. No results for input(s): "AMMONIA" in the last 168 hours. Coagulation Profile: No results for input(s): "INR", "PROTIME" in the last 168 hours. Cardiac Enzymes: No results for input(s): "CKTOTAL", "CKMB", "CKMBINDEX", "TROPONINI" in the last 168 hours. BNP (last 3 results) No results for input(s): "PROBNP" in the last 8760 hours. HbA1C: No results for input(s): "HGBA1C" in the last 72 hours. CBG: No results for input(s): "GLUCAP" in the last 168 hours. Lipid Profile: No results for input(s): "CHOL", "HDL", "LDLCALC", "TRIG", "CHOLHDL", "LDLDIRECT" in the last 72 hours. Thyroid Function Tests: No results for input(s): "TSH", "T4TOTAL", "FREET4", "T3FREE", "THYROIDAB" in the last 72 hours. Anemia Panel: No results for input(s): "VITAMINB12", "FOLATE", "FERRITIN", "TIBC", "IRON", "RETICCTPCT" in the last 72 hours. Urine analysis:    Component Value Date/Time   COLORURINE YELLOW 06/18/2023 1428   APPEARANCEUR CLEAR 06/18/2023 1428   LABSPEC 1.020 06/18/2023 1428   PHURINE 7.0 06/18/2023 1428   GLUCOSEU NEGATIVE 06/18/2023 1428   HGBUR NEGATIVE 06/18/2023 1428   BILIRUBINUR NEGATIVE 06/18/2023 1428   KETONESUR TRACE (A) 06/18/2023 1428   PROTEINUR NEGATIVE 05/05/2021 1922   UROBILINOGEN 2.0 (A) 06/18/2023 1428   NITRITE NEGATIVE 06/18/2023  1428   LEUKOCYTESUR NEGATIVE 06/18/2023 1428    Radiological Exams on Admission: DG Chest Portable 1 View  Result Date: 08/12/2023 CLINICAL DATA:  COVID-19 positivity with COPD exacerbation EXAM: PORTABLE CHEST 1 VIEW COMPARISON:  Film from the previous day. FINDINGS: The heart size and mediastinal contours are within normal limits. Both lungs are clear. The visualized skeletal structures are unremarkable. Aortic calcifications are noted. Postsurgical change of the cervical spine is noted. IMPRESSION: No active disease. Electronically Signed   By: Alcide Clever M.D.   On: 08/12/2023 01:15   DG Chest Port 1 View  Result Date: 08/11/2023 CLINICAL DATA:  63 year old female with history of shortness of breath. EXAM: PORTABLE CHEST 1 VIEW COMPARISON:  Chest x-ray 08/05/2023. FINDINGS: Lung volumes are normal. No consolidative airspace disease. No pleural effusions. No pneumothorax. No pulmonary nodule or mass noted. Pulmonary vasculature and the cardiomediastinal silhouette are within normal limits. Atherosclerosis in the thoracic aorta. Orthopedic fixation hardware in the cervical spine incidentally noted. IMPRESSION: 1.  No radiographic evidence of acute cardiopulmonary disease. 2. Aortic atherosclerosis. Electronically Signed   By: Trudie Reed M.D.   On: 08/11/2023 10:58      Assessment/Plan   Principal Problem:   Acute exacerbation of chronic obstructive pulmonary disease (COPD) (HCC) Active Problems:   Essential hypertension   GERD (gastroesophageal reflux  disease)   Chronic iron deficiency anemia   Bipolar disorder (HCC)   Hyperlipidemia   COVID-19 virus infection   Hypokalemia   Hyperglycemia   History of seizures     #) Acute COPD exacerbation: in the context of a documented history of COPD, diagnosis of acute exacerbation on the basis of 1 week of progressive shortness of breath associate increased work of breathing, with presenting CXR showing no evidence of acute  cardiopulmonary process, including no evidence of infiltrate, edema, effusion, or pneumothorax. Etiology of exac appears to have a basis of recent diagnosis of COVID-19 infection. no clinical or radiographic evidence to suggest acutely decompensated heart failure at this time. Additionally, ACS appears less likely in the absence of chest pain, with EKG showing no evidence of acute ischemic changes. Clinically, acute PE also appears to be less likely at this time. influenza and RSV PCR are negative. Will add-on procalcitonin to further eval for underlying bacterial pna. Outpatient respiratory regimen appears to include Breztri inhaler, Singulair, as well as as needed albuterol inhaler, upon which the patient reports good outpatient compliance.  She notes that she is a former smoker.   Plan: monitor continuous pulse oxymetry. Monitor on telemetry. Solumedrol. Scheduled duonebs q6 hours. Prn albuterol inhaler. BMP in the morning. Repeat CBC in the morning. Check serum Mg and Phos levels. Will attempt additional chart review to evaluate most recent PFT results. Will start Azithromycin for benefit of shortened duration of hospitalization associated with antibiotic initiation in the setting of acute COPD exacerbation. Check blood gas. Add-on procalcitonin.  Flutter valve, incentive spirometry.  Further evaluation management of COVID-19 infection, as below.               #) COVID-19 infection: diagnosis on the basis of: 1 week of rhinitis, rhinorrhea, cough, progressing to shortness of breath, with COVID-19 test performed as an outpatient on 08/06/2023 found to be positive, and confirmed to be a COVID-19 PCR test performed on the morning of 08/11/2023 in the emergency department. Of note, presentation does not appear to be associated with acute hypoxic respiratory distress/failure, with patient maintaining O2 sats greater than 94% on room air. Overall, it does not appear that criteria are met at the present  time for patient's COVID-19 infection to be considered severe in nature.  However, we will proceed with solumedrol therapy given separate indication for initiation of such in the form of concomitant presenting acute COPD exacerbation.  As remdesivir, Paxlovid, and molnupiravir showed a limited benefit, we will refrain from initiation of any of these antiviral medications at this time.  Of note, no known history of underlying diabetes.  Plan: Airborne and contact precautions. Monitor continuous pulse oximetry and monitor on telemetry. prn supplemental O2 to maintain O2 sats greater than or equal to 94%. Proning protocol initiated.  Further evaluation management of presenting acute COPD exacerbation, including solumedrol, scheduled duo nebulizer treatments, prn albuterol. PRN acetaminophen for fever.  Add on CRP, and repeat CRP in the morning.  Check serum magnesium and phosphorus levels. Check CMP and CBC in the morning. Flutter valve and incentive spirometry.  Add on procalcitonin level.                     #) Sepsis: Appears to be on the basis of COVID-19, as above, with SIRS criteria met via presenting leukocytosis, tachycardia, and tachypnea.  In the absence of associated evidence of endorgan damage, patient's sepsis does not meet criteria to be considered severe in nature  at this time.  Lactic acid level currently pending.  No evidence of associated hypotension thus far.  In the absence of LA level greater than or equal to 4.0 and in the absence of hypotension, criteria are not met at this time for initiation of a 30 mL/kg IVF bolus. No evidence of additional underlying infectious process beyond COVID-19, and bacterial pneumonia is felt to be less likely but will add on procalcitonin level to further assess.  As patient's sepsis is felt to be viral in nature, will refrain from antibiotic coverage at this time, aside from azithromycin which is being initiated for anti-inflammatory  properties in the setting of acute COPD exacerbation.  Plan: Work-up and management of COVID-19 infection, as above. Check blood cultures x2.  Repeat CBC with differential in the morning.  Check urinalysis. PRN acetaminophen for fever. Check lactic acid.  Check procalcitonin.                   #) Hypokalemia: presenting potassium level noted to be 3.3, with suspected contributions from intracellular shifting of serum potassium via increased use of beta-2 agonists in the context of presenting acute COPD exacerbation.  She received potassium chloride 40 mill equivalents p.o. x 1 dose in the ED this evening.  Plan: monitor on tele. CMP, mag level in the AM.                   #) Hyperglycemia: In the absence of any underlying diagnosis of diabetes, including most recent hemoglobin A1c found to be 5.9% when checked on 06/18/2023, this elevated blood sugar appears to be on the basis of systemic corticosteroids that have been started as component of management for acute COPD exacerbation/COVID-19 infection.  No evidence of anion gap metabolic acidosis to suggest DKA.  Plan: Lactated Ringer's at 100 cc/h x 8 hours.  CBG monitoring on a before every meal/at bedtime basis with low-dose sliding scale insulin.                # (Bipolar disorder: Documented history of such, for which she is on Abilify as an outpatient.  Plan: Continue outpatient Abilify.                  #) Essential Hypertension: documented h/o such, with outpatient antihypertensive regimen including irbesartan, HCTZ.  SBP's in the ED today: 120s to 140s mmHg. in the setting of lytic infectious process, as well as plan for gentle IV fluids overnight, will hold him HCTZ as well as irbesartan for now.  Plan: Close monitoring of subsequent BP via routine VS. hold home HCTZ and irbesartan for now, as above.                   #) Hyperlipidemia: documented h/o such. On  high intensity atorvastatin as outpatient.   Plan: continue home statin.                   #) GERD: documented h/o such; on Protonix as well as Pepcid as outpatient.   Plan: continue home PPI and H2 blocker.                   #) History of seizures: Documented history of such, without clinical evidence to suggest active seizures at this time.  Outpatient antiepileptic regimen consists of: Carbamazepine.  Additionally, she is noted to be on gabapentin as an outpatient.   Plan: Continue outpatient antiepileptic regimen.               #)  Chronic iron deficiency anemia: Documented history of such, a/w with baseline hgb range 10-12, with presenting hgb consistent with this range, in the absence of any overt evidence of active bleed.  On daily oral iron supplementation as an outpatient.   Plan: Repeat CBC in the morning.  Continue daily oral iron supplementation.      DVT prophylaxis: SCD's   Code Status: Full code Family Communication: none Disposition Plan: Per Rounding Team Consults called: none;  Admission status: inpatient     I SPENT GREATER THAN 75  MINUTES IN CLINICAL CARE TIME/MEDICAL DECISION-MAKING IN COMPLETING THIS ADMISSION.      Chaney Born Darcie Mellone DO [facility]  From 7PM - 7AM   08/12/2023, 1:44 AM

## 2023-08-12 NOTE — Assessment & Plan Note (Signed)
BMI 32 

## 2023-08-12 NOTE — Assessment & Plan Note (Signed)
See 09/2017 note from Dr. Arbutus Leas, presumptive diagnosis.  On Tegretol for Bipolar.

## 2023-08-12 NOTE — Progress Notes (Addendum)
Patient Saturations on Room Air at Rest = 93%  Patient Saturations on Room Air while Ambulating = 92%  HR to 130 and pt dyspneic but independent

## 2023-08-12 NOTE — ED Notes (Signed)
Provider put in antibiotics prior to cultures. Antibiotics hung, stopped, drew a set, then resumed.

## 2023-08-12 NOTE — Assessment & Plan Note (Signed)
Hgb stable relative to baseline 

## 2023-08-12 NOTE — Assessment & Plan Note (Signed)
BP slightly elevated.  - Continue hydrochlorothiazide and irbesartan

## 2023-08-12 NOTE — Hospital Course (Addendum)
[name] is a 62 y.o. 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  COVID+.  CXR clear and on room air.

## 2023-08-12 NOTE — Consult Note (Signed)
   [facility] Inpatient Consult   08/12/2023  Antigone Harrold 1961/12/12 [id number]  [facility] [[facility]]  Accountable Care Organization [ACO] Patient: [employer] Medicare + Medicaid [listed as [employer] SNP]  Primary Care Provider:  Corwin Levins, MD listed with [facility] which is listed to provide the transition of care follow up.     Review of patient's medical record for past medical history and membership affiliate roster reveals this patient is a [employer] Needs Program] member and will be followed with the [facility] Medicare assigned team member in that program. Patient is also showing active with [employer].  Will update Community RN CC of admission and hx with Hum SNP listing. Reviewed for additional post hospital needs.  Of note, [facility] Care Management services does not replace or interfere with any services that are arranged by inpatient case management or social work.  For additional questions or referrals please contact:    Plan: Updated Community RN CC of admission and Hum SNP patient.  Will check for post hospital community follow up and updates for any new needs.Charlesetta Shanks, RN BSN CCM [facility]  [phone] business mobile phone Toll free office [phone]  Fax number: [phone] [email or web address]

## 2023-08-12 NOTE — Assessment & Plan Note (Signed)
Diagnosed >5 days ago, outside window for Paxlovid.  No infiltrates or hypoxia. - Supportive care

## 2023-08-12 NOTE — Assessment & Plan Note (Signed)
Given worsening transaminitis (likely due to COVID) will hold psych meds - Hold Abilify and Tegretol

## 2023-08-12 NOTE — ED Notes (Signed)
ED TO INPATIENT HANDOFF REPORT  ED Nurse Name and Phone #: Alycia Rossetti [phone]   S Name/Age/Gender [name] 62 y.o. female Room/Bed: 033C/033C  Code Status   Code Status: Full Code  Home/SNF/Other Home Patient oriented to: self, place, time, and situation Is this baseline? Yes   Triage Complete: Triage complete  Chief Complaint Acute exacerbation of chronic obstructive pulmonary disease (COPD) (HCC) [J44.1]  Triage Note Pt was dc'd here earlier today for COVID+ and COPD exacerbation. She tried albuterol at home and it did not help. She has audible wheezes. She is A&Ox4, ambulatory. Coming from home.   EMS gave... 2 duonebs and 2G of mag and 125mg  of solumedrol.   20G RAC   Allergies Allergies  Allergen Reactions   Sulfa Antibiotics Nausea And Vomiting and Other (See Comments)   Venofer [Iron Sucrose] Hives    Level of Care/Admitting Diagnosis ED Disposition     ED Disposition  Admit   Condition  --   Comment  Hospital [facility] [[id number]]  Level of Care: Progressive [102]  Admit to Progressive based on following criteria: MULTISYSTEM THREATS such as stable sepsis, metabolic/electrolyte imbalance with or without encephalopathy that is responding to early treatment.  May [facility] to [facility] or [facility] if equivalent level of care is available:: No  Covid Evaluation: Asymptomatic - no recent exposure (last 10 days) testing not required  Diagnosis: Acute exacerbation of chronic obstructive pulmonary disease (COPD) [facility]) [[id number]]  Admitting Physician: Angie Fava [[id number]]  Attending Physician: Angie Fava [[id number]]  Certification:: I certify this patient will need inpatient services for at least 2 midnights  Expected Medical Readiness: 08/14/2023          B Medical/Surgery History Past Medical History:  Diagnosis Date   Anemia, iron deficiency 12/22/2014   pt. denies   Arthritis    ASTHMA  05/12/2009   Severe AFL (Spirometry 05/2009: pre-BD FEV1 0.87L 34% pred, post-BD FEV1 1.11L 44% pred) Volumes hyperinflated Decreased DLCO that does not fully correct to normal range for alveolar volume.      Bipolar disorder (HCC)    with anxiety, depression   COPD (chronic obstructive pulmonary disease) (HCC)    Eczema 05/18/2022   Fibromyalgia 05/14/2014   GERD (gastroesophageal reflux disease)    History of kidney stones    Hyperlipidemia 04/20/2017   HYPERTENSION 05/12/2009   Qualifier: Diagnosis of  By: Truman Hayward Duncan Dull), [name]     Peripheral vascular disease (HCC)    Pneumonia    Prediabetes 02/23/2014   pt. denies   Seizure (HCC)    Stroke (HCC) 11/2020   Urticaria    Past Surgical History:  Procedure Laterality Date   ABDOMINAL HYSTERECTOMY     ANTERIOR CERVICAL DECOMP/DISCECTOMY FUSION N/A 07/28/2020   Procedure: ANTERIOR CERVICAL DECOMPRESSION/DISCECTOMY FUSION. INTERBODY PROTHESIS, PLATE/SCREWS CERVICAL THREE- CERVICAL FOUR, CERVICAL FOUR- CERVICAL FIVE;  Surgeon: Tressie Stalker, MD;  Location: [facility] OR;  Service: Neurosurgery;  Laterality: N/A;   BACK SURGERY     COLONOSCOPY  12/20/2011   Procedure: COLONOSCOPY;  Surgeon: Freddy Jaksch, MD;  Location: [facility] ENDOSCOPY;  Service: Endoscopy;  Laterality: N/A;   COLONOSCOPY  03/05/2012   Procedure: COLONOSCOPY;  Surgeon: Freddy Jaksch, MD;  Location: [facility] ENDOSCOPY;  Service: Endoscopy;  Laterality: N/A;   DIAGNOSTIC LAPAROSCOPY     HEMORRHOID SURGERY     INCISE AND DRAIN ABCESS     KIDNEY STONE SURGERY     NECK SURGERY  x 2 Dr Terrilee Files   SPINE SURGERY  2019   TOE SURGERY     TUBAL LIGATION       A IV Location/Drains/Wounds Patient Lines/Drains/Airways Status     Active Line/Drains/Airways     Name Placement date Placement time Site Days   Peripheral IV 08/11/23 20 G Anterior;Proximal;Right Forearm 08/11/23  2131  Forearm  1            Intake/Output Last 24 hours No intake or output data in the 24  hours ending 08/12/23 0237  Labs/Imaging Results for orders placed or performed during the hospital encounter of 08/11/23 (from the past 48 hour(s))  CBC with Differential     Status: Abnormal   Collection Time: 08/11/23  9:30 PM  Result Value Ref Range   WBC 13.1 (H) 4.0 - 10.5 K/uL   RBC 3.93 3.87 - 5.11 MIL/uL   Hemoglobin 10.2 (L) 12.0 - 15.0 g/dL   HCT 57.8 (L) 46.9 - 62.9 %   MCV 85.2 80.0 - 100.0 fL   MCH 26.0 26.0 - 34.0 pg   MCHC 30.4 30.0 - 36.0 g/dL   RDW 52.8 41.3 - 24.4 %   Platelets 512 (H) 150 - 400 K/uL   nRBC 0.0 0.0 - 0.2 %   Neutrophils Relative % 84 %   Neutro Abs 11.0 (H) 1.7 - 7.7 K/uL   Lymphocytes Relative 12 %   Lymphs Abs 1.5 0.7 - 4.0 K/uL   Monocytes Relative 2 %   Monocytes Absolute 0.3 0.1 - 1.0 K/uL   Eosinophils Relative 0 %   Eosinophils Absolute 0.0 0.0 - 0.5 K/uL   Basophils Relative 0 %   Basophils Absolute 0.0 0.0 - 0.1 K/uL   Immature Granulocytes 2 %   Abs Immature Granulocytes 0.23 (H) 0.00 - 0.07 K/uL    Comment: Performed at [facility], [address] [place], [place] [address]  Basic metabolic panel     Status: Abnormal   Collection Time: 08/11/23  9:30 PM  Result Value Ref Range   Sodium 139 135 - 145 mmol/L   Potassium 3.3 (L) 3.5 - 5.1 mmol/L   Chloride 99 98 - 111 mmol/L   CO2 22 22 - 32 mmol/L   Glucose, Bld 346 (H) 70 - 99 mg/dL    Comment: Glucose reference range applies only to samples taken after fasting for at least 8 hours.   BUN 16 6 - 20 mg/dL   Creatinine, Ser 2.53 (H) 0.44 - 1.00 mg/dL   Calcium 9.5 8.9 - 66.4 mg/dL   GFR, Estimated >40 >34 mL/min    Comment: (NOTE) Calculated using the CKD-EPI Creatinine Equation (2021)    Anion gap 18 (H) 5 - 15    Comment: Performed at [facility], [address] [place], [place] [address]  I-Stat venous blood gas, [facility] ED, [facility], [facility])     Status: Abnormal   Collection Time: 08/11/23 10:42 PM  Result Value Ref Range   pH, Ven 7.470 (H) 7.25 - 7.43   pCO2,  Ven 36.0 (L) 44 - 60 mmHg   pO2, Ven 84 (H) 32 - 45 mmHg   Bicarbonate 26.2 20.0 - 28.0 mmol/L   TCO2 27 22 - 32 mmol/L   O2 Saturation 97 %   Acid-Base Excess 3.0 (H) 0.0 - 2.0 mmol/L   Sodium 139 135 - 145 mmol/L   Potassium 3.4 (L) 3.5 - 5.1 mmol/L   Calcium, Ion 1.10 (L)  1.15 - 1.40 mmol/L   HCT 34.0 (L) 36.0 - 46.0 %   Hemoglobin 11.6 (L) 12.0 - 15.0 g/dL   Sample type VENOUS    *Note: Due to a large number of results and/or encounters for the requested time period, some results have not been displayed. A complete set of results can be found in Results Review.   DG Chest Portable 1 View  Result Date: 08/12/2023 CLINICAL DATA:  COVID-19 positivity with COPD exacerbation EXAM: PORTABLE CHEST 1 VIEW COMPARISON:  Film from the previous day. FINDINGS: The heart size and mediastinal contours are within normal limits. Both lungs are clear. The visualized skeletal structures are unremarkable. Aortic calcifications are noted. Postsurgical change of the cervical spine is noted. IMPRESSION: No active disease. Electronically Signed   By: Alcide Clever M.D.   On: 08/12/2023 01:15   DG Chest Port 1 View  Result Date: 08/11/2023 CLINICAL DATA:  62 year old female with history of shortness of breath. EXAM: PORTABLE CHEST 1 VIEW COMPARISON:  Chest x-ray 08/05/2023. FINDINGS: Lung volumes are normal. No consolidative airspace disease. No pleural effusions. No pneumothorax. No pulmonary nodule or mass noted. Pulmonary vasculature and the cardiomediastinal silhouette are within normal limits. Atherosclerosis in the thoracic aorta. Orthopedic fixation hardware in the cervical spine incidentally noted. IMPRESSION: 1.  No radiographic evidence of acute cardiopulmonary disease. 2. Aortic atherosclerosis. Electronically Signed   By: Trudie Reed M.D.   On: 08/11/2023 10:58    Pending Labs Unresulted Labs (From admission, onward)     Start     Ordered   08/12/23 0500  CBC with Differential/Platelet   Tomorrow morning,   R        08/12/23 0122   08/12/23 0500  Comprehensive metabolic panel  Tomorrow morning,   R        08/12/23 0122   08/12/23 0500  Magnesium  Tomorrow morning,   R        08/12/23 0122   08/12/23 0500  Phosphorus  Tomorrow morning,   R        08/12/23 0122   08/12/23 0500  Blood gas, venous  Tomorrow morning,   R        08/12/23 0123   08/12/23 0204  Urinalysis, Complete w Microscopic -Urine, Clean Catch  Once,   R       Question:  Specimen Source  Answer:  Urine, Clean Catch   08/12/23 0203   08/12/23 0158  Culture, blood (Routine X 2) w Reflex to ID Panel  BLOOD CULTURE X 2,   R (with TIMED occurrences)      08/12/23 0157   08/12/23 0137  C-reactive protein  Add-on,   AD        08/12/23 0136   08/12/23 0136  Procalcitonin  Add-on,   AD       References:    Procalcitonin Lower Respiratory Tract Infection AND Sepsis Procalcitonin Algorithm   08/12/23 0135            Vitals/Pain Today's Vitals   08/12/23 0000 08/12/23 0030 08/12/23 0100 08/12/23 0205  BP: (!) 149/98 (!) 140/98 (!) 152/94   Pulse: 96 94 96   Resp: 19 (!) 25 16   Temp:      TempSrc:      SpO2: 98% 96% 100% 97%  PainSc:        Isolation Precautions Airborne and Contact precautions  Medications Medications  acetaminophen (TYLENOL) tablet 650 mg (has no administration in time range)  Or  acetaminophen (TYLENOL) suppository 650 mg (has no administration in time range)  ondansetron (ZOFRAN) injection 4 mg (has no administration in time range)  methylPREDNISolone sodium succinate (SOLU-MEDROL) 125 mg/2 mL injection 80 mg (80 mg Intravenous Given 08/12/23 0209)  ipratropium-albuterol (DUONEB) 0.5-2.5 (3) MG/3ML nebulizer solution 3 mL (3 mLs Nebulization Given 08/12/23 0205)  albuterol (PROVENTIL) (2.5 MG/3ML) 0.083% nebulizer solution 2.5 mg (has no administration in time range)  azithromycin (ZITHROMAX) 500 mg in sodium chloride 0.9 % 250 mL IVPB (500 mg Intravenous New Bag/Given 08/12/23  0150)  insulin aspart (novoLOG) injection 0-9 Units (has no administration in time range)  lactated ringers infusion ( Intravenous New Bag/Given 08/12/23 0211)  ARIPiprazole (ABILIFY) tablet 10 mg (has no administration in time range)  atorvastatin (LIPITOR) tablet 40 mg (has no administration in time range)  famotidine (PEPCID) tablet 40 mg (40 mg Oral Given 08/12/23 0209)  ferrous sulfate tablet 325 mg (has no administration in time range)  gabapentin (NEURONTIN) capsule 100 mg (100 mg Oral Given 08/12/23 0209)  hydrOXYzine (ATARAX) tablet 25 mg (has no administration in time range)  montelukast (SINGULAIR) tablet 10 mg (10 mg Oral Given 08/12/23 0209)  pantoprazole (PROTONIX) EC tablet 40 mg (40 mg Oral Given 08/12/23 0209)  carbamazepine (TEGRETOL) tablet 400 mg (has no administration in time range)    And  carbamazepine (TEGRETOL) tablet 600 mg (has no administration in time range)  albuterol (PROVENTIL) (2.5 MG/3ML) 0.083% nebulizer solution 10 mg (10 mg Nebulization Given 08/11/23 2303)  sodium chloride 0.9 % bolus 1,000 mL (1,000 mLs Intravenous New Bag/Given 08/12/23 0053)  potassium chloride SA (KLOR-CON M) CR tablet 40 mEq (40 mEq Oral Given 08/12/23 0053)    Mobility walks     Focused Assessments Pulmonary Assessment Handoff:  Lung sounds: Bilateral Breath Sounds: Diminished L Breath Sounds: Diminished, Expiratory wheezes R Breath Sounds: Diminished, Expiratory wheezes O2 Device: Room Air      R Recommendations: See Admitting Provider Note  Report given to:   Additional Notes: Pt is alert oriented, ambulatory. Here for COPD exacerbation.

## 2023-08-12 NOTE — TOC Initial Note (Addendum)
Transition of Care [facility]) - Initial/Assessment Note    Patient Details  Name: [name] MRN: [id number] Date of Birth: 1961-10-10  [facility]) CM/SW Contact:    Leone Haven, RN Phone Number: 08/12/2023, 12:39 PM  Clinical Narrative:                 From home alone, has PCP and insurance on file, states has no HH services in place at this time , she has a walker and a cane and a shower chair, neb machine , bipap thru [employer].  States family member will transport her home at [employer] she hopes and family is support system, states gets medications from [facility] on [address]  Pta ambulatory with walker.  Expected Discharge Plan: Home/Self Care Barriers to Discharge: Continued Medical Work up   Patient Goals and CMS Choice Patient states their goals for this hospitalization and ongoing recovery are:: return home   Choice offered to / list presented to : NA      Expected Discharge Plan and Services In-house Referral: NA Discharge Planning Services: CM Consult Post Acute Care Choice: NA Living arrangements for the past 2 months: Apartment                 DME Arranged: N/A DME Agency: NA       HH Arranged: NA          Prior Living Arrangements/Services Living arrangements for the past 2 months: Apartment Lives with:: Self Patient language and need for interpreter reviewed:: Yes Do you feel safe going back to the place where you live?: Yes      Need for Family Participation in Patient Care: Yes (Comment) Care giver support system in place?: Yes (comment) Current home services: DME (walker, cane, shower chair) Criminal Activity/Legal Involvement Pertinent to Current Situation/Hospitalization: No - Comment as needed  Activities of Daily Living Home Assistive Devices/Equipment: None ADL Screening (condition at time of admission) Patient's cognitive ability adequate to safely complete daily activities?: Yes Is the patient deaf or have  difficulty hearing?: No Does the patient have difficulty seeing, even when wearing glasses/contacts?: No Does the patient have difficulty concentrating, remembering, or making decisions?: No Patient able to express need for assistance with ADLs?: Yes Does the patient have difficulty dressing or bathing?: No Independently performs ADLs?: Yes (appropriate for developmental age) Does the patient have difficulty walking or climbing stairs?: No Weakness of Legs: None Weakness of Arms/Hands: None  Permission Sought/Granted Permission sought to share information with : Case Manager Permission granted to share information with : Yes, Verbal Permission Granted              Emotional Assessment   Attitude/Demeanor/Rapport: Engaged Affect (typically observed): Appropriate Orientation: : Oriented to Self, Oriented to Place, Oriented to  Time, Oriented to Situation Alcohol / Substance Use: Not Applicable Psych Involvement: No (comment)  Admission diagnosis:  Acute exacerbation of chronic obstructive pulmonary disease (COPD) (HCC) [J44.1] COPD exacerbation (HCC) [J44.1] Patient Active Problem List   Diagnosis Date Noted   Acute exacerbation of chronic obstructive pulmonary disease (COPD) (HCC) 08/12/2023   Hypokalemia 08/12/2023   Hyperglycemia 08/12/2023   History of seizures 08/12/2023   Obesity (BMI 30-39.9) 08/12/2023   OSA (obstructive sleep apnea) 08/12/2023   Transaminitis 08/12/2023   COVID-19 virus infection 08/07/2023   Acute respiratory failure with hypoxia and hypercapnia (HCC) 06/23/2023   Peripheral edema 06/18/2023   Cirrhosis (HCC) 03/07/2023   Vocal cord dysfunction 03/05/2023   Urinary incontinence  01/25/2023   Influenza A 01/11/2023   Bilateral impacted cerumen 12/04/2022   Not well controlled severe persistent asthma 10/25/2022   Acute on chronic respiratory failure with hypoxemia (HCC) 10/07/2022   Abnormal TSH 09/13/2022   Chronic sinusitis 08/14/2022   PND  (paroxysmal nocturnal dyspnea) 06/07/2022   Vitamin D deficiency 05/20/2022   Eczema 05/18/2022   Insomnia 05/18/2022   Allergic conjunctivitis 05/18/2022   Chronic respiratory failure with hypoxia and hypercapnia (HCC) 04/30/2022   Bilateral hearing loss 04/14/2022   Moderate persistent asthma without complication 02/02/2022   Low back pain 10/11/2021   Abnormal CXR 07/17/2021   Aortic atherosclerosis (HCC) 07/05/2021   URI (upper respiratory infection) 07/05/2021   Arthrodesis status 06/20/2021   Drug allergy 05/14/2021   Acute cough 05/14/2021   Bilateral leg weakness 04/17/2021   Status post cervical spinal fusion 12/27/2020   History of stroke 12/11/2020   Medication management 10/21/2020   Body mass index (BMI) 26.0-26.9, adult 08/23/2020   Healthcare maintenance 08/11/2020   Cervical spondylosis with myelopathy and radiculopathy 07/28/2020   Preop exam for internal medicine 06/19/2020   Hypotension due to medication 06/11/2020   Essential hypertension 05/17/2020   Other spondylosis with myelopathy, cervical region 02/16/2020   Cervical spondylosis 02/02/2020   Radiculopathy, cervical region 02/02/2020   Spondylolysis, cervical region 02/02/2020   Neck pain 02/02/2020   Leg cramping 08/12/2019   Laryngopharyngeal reflux (LPR) 05/26/2019   Sinus pressure 05/26/2019   Hypophosphatemia 01/07/2019   Vertigo 09/23/2018   Dysfunction of left eustachian tube 09/23/2018   Gait disorder 04/21/2018   Leukocytosis 04/15/2018   Nausea & vomiting 04/15/2018   Seizure disorder (HCC)    Prolapsed cervical intervertebral disc 03/11/2018   Cervical radiculopathy 02/18/2018   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   Syncope 09/04/2017   Constipation 09/04/2017   Nonintractable headache 09/04/2017   Leg swelling 07/26/2017   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   Hyperlipidemia 04/20/2017   Pedal edema 04/19/2017   Bipolar disorder (HCC) 09/19/2016   Facial pain 07/12/2016   Rash  06/14/2016   Asthma/COPD exacerbation 02/21/2016   Migraine 01/25/2016   Idiopathic urticaria/pruritus 01/23/2016   Allergic rhinitis 01/23/2016   Oral candidiasis 01/23/2016   Greater trochanteric bursitis of both hips 09/08/2015   Chest pain 06/22/2015   Itching 06/22/2015   Bilateral knee pain 06/22/2015   Bilateral hip pain 06/22/2015   Chronic iron deficiency anemia 12/22/2014   Fatigue 12/21/2014   Rash and nonspecific skin eruption 08/24/2014   Intertrigo 08/07/2014   Dyspareunia 07/12/2014   Menopausal syndrome (hot flushes) 07/12/2014   History of tobacco abuse 07/12/2014   Dizziness 06/22/2014   Weakness 06/22/2014   Fibromyalgia 05/14/2014   Encounter for well adult exam with abnormal findings 04/22/2014   Recurrent boils 04/22/2014   Recurrent falls 04/22/2014   Peripheral vascular disease (HCC)    Depression    Anxiety    GERD (gastroesophageal reflux disease)    Prediabetes 02/23/2014   COPD exacerbation (HCC) 02/23/2014   Screening mammogram for high-risk patient 02/23/2014   Back pain 07/22/2013   COPD/asthma 08/24/2009   Headache(784.0) 08/24/2009   HTN (hypertension) 05/12/2009   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 [place] [address] Phone: [phone] Fax: [phone]     Social Determinants of Health (SDOH) Social History: SDOH Screenings   Food Insecurity: No Food Insecurity (08/12/2023)  Housing: Low Risk  (08/12/2023)  Transportation Needs: No Transportation Needs (08/12/2023)  Utilities: Not At Risk  (08/12/2023)  Alcohol Screen: Low Risk  (11/26/2022)  Depression (PHQ2-9): Low Risk  (06/18/2023)  Financial Resource Strain: Low Risk  (11/26/2022)  Physical Activity: Sufficiently Active (11/26/2022)  Social Connections: Socially Isolated (11/26/2022)  Stress: No Stress Concern Present (11/26/2022)  Tobacco Use: Medium Risk (08/12/2023)   SDOH Interventions:     Readmission Risk Interventions    08/12/2023   12:35 PM 02/27/2023   10:26 AM  Readmission Risk Prevention Plan  Transportation Screening Complete Complete  Medication Review (RN Care Manager) Complete Referral to Pharmacy  PCP or Specialist appointment within 3-5 days of discharge Complete Complete  HRI or Home Care Consult Complete Complete  SW Recovery Care/Counseling Consult -- Complete  Palliative Care Screening Not Applicable Not Applicable  Skilled Nursing Facility Not Applicable Not Applicable

## 2023-08-12 NOTE — Assessment & Plan Note (Signed)
-   Supplemented and resolved 

## 2023-08-12 NOTE — Assessment & Plan Note (Addendum)
Patient noncompliant with CPAP, woke up overnight dyspneic. - Continue CPAP at night

## 2023-08-12 NOTE — Assessment & Plan Note (Signed)
None at present.  Chronic.

## 2023-08-12 NOTE — Assessment & Plan Note (Signed)
Due to COVID.  In setting of imaging finding of nodular liver - Trend LFTs - Hold statin

## 2023-08-12 NOTE — Assessment & Plan Note (Signed)
-   Hold statin given LFTs

## 2023-08-12 NOTE — Assessment & Plan Note (Signed)
Imaging finding, well compensated.  No ascites.

## 2023-08-12 NOTE — ED Notes (Signed)
Pt brought to bathroom in wheelchair, pt had steady gait but felt slightly short of breath standing

## 2023-08-12 NOTE — Inpatient Diabetes Management (Signed)
Inpatient Diabetes Program Recommendations  AACE/ADA: New Consensus Statement on Inpatient Glycemic Control (2015)  Target Ranges:  Prepandial:   less than 140 mg/dL      Peak postprandial:   less than 180 mg/dL (1-2 hours)      Critically ill patients:  140 - 180 mg/dL   Lab Results  Component Value Date   GLUCAP 227 (H) 08/12/2023   HGBA1C 5.9 06/18/2023    Review of Glycemic Control  Latest Reference Range & Units 08/12/23 06:50  Glucose-Capillary 70 - 99 mg/dL 409 (H)  (H): Data is abnormally high   Latest Reference Range & Units 08/11/23 21:30 08/12/23 02:31  Glucose 70 - 99 mg/dL 811 (H) 914 (H)  (H): Data is abnormally high Diabetes history: Pre-DM Outpatient Diabetes medications: none Current orders for Inpatient glycemic control: Novolog 0-9 units TID and Solumedrol 80 mg BID  Inpatient Diabetes Program Recommendations:    Might consider: Semglee 12 units every day while receiving steroids.    Will continue to follow while inpatient.  Thank you, Dulce Sellar, MSN, CDCES Diabetes Coordinator Inpatient Diabetes Program [phone] (team pager from 8a-5p)

## 2023-08-12 NOTE — Progress Notes (Signed)
  Progress Note   Patient: [name] QMV:[id number] DOB: March 12, 1961 DOA: 08/11/2023     0 DOS: the patient was seen and examined on 08/12/2023 at 8:07AM      Brief hospital course: Mrs. Conley is a 62 y.o. 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  COVID+.  CXR clear and on room air.     Assessment and Plan: * Acute exacerbation of chronic obstructive pulmonary disease (COPD) (HCC) Very tight and wheezing on exam.  No hypoxia at all, but reports only able to take a few steps due to dyspnea.  Sepsis ruled out.  Lactic acid elevation due to albuterol use in respiratory disease, clinically obviously not sepsis.  Admitting physician did also not treat sepsis with appropriate fluids if the working diagnosis had been sepsis. - Continue solumedrol - Continue azithromycin - Med rec not completed by admitting MD, will restart home ICS/LABA/LAMA, Zyrtec and Flonase - Continue Singulair - Flutter, IS - Continue PPI, Pepcid    COVID-19 virus infection Diagnosed >5 days ago, outside window for Paxlovid.  No infiltrates or hypoxia. - Supportive care  Transaminitis Due to COVID.  In setting of imaging finding of nodular liver - Trend LFTs - Hold statin  OSA (obstructive sleep apnea) - Resume home CPAP, omitted by admitting provider  Obesity (BMI 30-39.9) BMI 32  Hypokalemia - Supplement K  Cirrhosis (HCC) Imaging finding, well compensated.  No ascites.  Essential hypertension BP elevated.  Meds not continued on admission - Restart home hydrochlorothiazide and irbesartan  Hypophosphatemia - Supplement Phos  Seizure disorder (HCC) See 09/2017 note from Dr. Arbutus Leas, presumptive diagnosis.  On Tegretol for Bipolar.  Bipolar disorder (HCC) -  Continue Abilify, Tegretol  Chronic iron deficiency anemia Hgb stable relative to baseline  Peripheral vascular disease (HCC) - Hold statin given LFTs  Headache(784.0) None at present.  Chronic.          Subjective: Patient very out of breath just with bed mobility overnight, feels tight and wheezy.  No confusion, no fever.  No nursing concerns.     Physical Exam: BP (!) 157/104 (BP Location: Right Arm)   Pulse 93   Temp 98.6 F (37 C) (Oral)   Resp 20   Ht 5\' 3"  (1.6 m)   Wt 82.1 kg   SpO2 97%   BMI 32.08 kg/m   Obese adult female, sitting up in bed, eating breakfast RRR, no murmurs, no peripheral edema Respiratory rate seems normal, does not appear out of breath at rest, lung sounds extremely diminished and tight, wheezing bilaterally, no rales Abdomen soft no tenderness palpation Attention normal, affect appropriate, judgment and insight appear normal    Data Reviewed: CMP shows mild transaminitis, normal renal function, mild hypokalemia and hypophosphatemia CBC shows mild anemia Chest x-ray clear  Family Communication: None present    Disposition: Status is: Inpatient The patient was admitted with COPD flare, will treat with steroids, bronchodilators, antibiotics  She is severely reduced FEV1, and typically takes several days before she is able to function properly to go home.         Author: Alberteen Sam, MD 08/12/2023 10:33 AM  For on call review [email or web address].

## 2023-08-13 ENCOUNTER — Ambulatory Visit (HOSPITAL_COMMUNITY): Payer: Medicare HMO

## 2023-08-13 ENCOUNTER — Ambulatory Visit: Payer: Medicare HMO

## 2023-08-13 ENCOUNTER — Ambulatory Visit: Payer: Medicare HMO | Admitting: Allergy & Immunology

## 2023-08-13 DIAGNOSIS — J441 Chronic obstructive pulmonary disease with (acute) exacerbation: Secondary | ICD-10-CM | POA: Diagnosis not present

## 2023-08-13 LAB — GLUCOSE, CAPILLARY
Glucose-Capillary: 208 mg/dL — ABNORMAL HIGH (ref 70–99)
Glucose-Capillary: 232 mg/dL — ABNORMAL HIGH (ref 70–99)
Glucose-Capillary: 215 mg/dL — ABNORMAL HIGH (ref 70–99)
Glucose-Capillary: 101 mg/dL — ABNORMAL HIGH (ref 70–99)

## 2023-08-13 LAB — CBC
HCT: 30.6 % — ABNORMAL LOW (ref 36.0–46.0)
Hemoglobin: 9.8 g/dL — ABNORMAL LOW (ref 12.0–15.0)
MCH: 27.1 pg (ref 26.0–34.0)
MCHC: 32 g/dL (ref 30.0–36.0)
MCV: 84.8 fL (ref 80.0–100.0)
Platelets: 495 10*3/uL — ABNORMAL HIGH (ref 150–400)
RBC: 3.61 MIL/uL — ABNORMAL LOW (ref 3.87–5.11)
RDW: 15.2 % (ref 11.5–15.5)
WBC: 20 10*3/uL — ABNORMAL HIGH (ref 4.0–10.5)
nRBC: 0.3 % — ABNORMAL HIGH (ref 0.0–0.2)

## 2023-08-13 LAB — COMPREHENSIVE METABOLIC PANEL
ALT: 118 U/L — ABNORMAL HIGH (ref 0–44)
AST: 57 U/L — ABNORMAL HIGH (ref 15–41)
Albumin: 3.6 g/dL (ref 3.5–5.0)
Alkaline Phosphatase: 57 U/L (ref 38–126)
Anion gap: 11 (ref 5–15)
BUN: 13 mg/dL (ref 6–20)
CO2: 29 mmol/L (ref 22–32)
Calcium: 9.1 mg/dL (ref 8.9–10.3)
Chloride: 98 mmol/L (ref 98–111)
Creatinine, Ser: 0.84 mg/dL (ref 0.44–1.00)
GFR, Estimated: >60 mL/min (ref >60)
Glucose, Bld: 228 mg/dL — ABNORMAL HIGH (ref 70–99)
Potassium: 3 mmol/L — ABNORMAL LOW (ref 3.5–5.1)
Sodium: 138 mmol/L (ref 135–145)
Total Bilirubin: 0.1 mg/dL — ABNORMAL LOW (ref 0.3–1.2)
Total Protein: 6.3 g/dL — ABNORMAL LOW (ref 6.5–8.1)

## 2023-08-13 LAB — C-REACTIVE PROTEIN: CRP: <0.5 mg/dL (ref <1.0)

## 2023-08-13 LAB — PHOSPHORUS: Phosphorus: 3 mg/dL (ref 2.5–4.6)

## 2023-08-13 MED ORDER — POTASSIUM CHLORIDE CRYS ER 20 MEQ PO TBCR
40.0000 meq | EXTENDED_RELEASE_TABLET | Freq: Two times a day (BID) | ORAL | Status: AC
  Administered 2023-08-13: 40 meq via ORAL
  Filled 2023-08-13: qty 2

## 2023-08-13 MED ORDER — IPRATROPIUM-ALBUTEROL 0.5-2.5 (3) MG/3ML IN SOLN
3.0000 mL | Freq: Two times a day (BID) | RESPIRATORY_TRACT | Status: DC
  Administered 2023-08-13 – 2023-08-20 (×15): 3 mL via RESPIRATORY_TRACT
  Filled 2023-08-13 (×15): qty 3

## 2023-08-13 NOTE — Progress Notes (Signed)
Mobility Specialist Progress Note:  Nurse requested Mobility Specialist to perform oxygen saturation test with pt which includes removing pt from oxygen both at rest and while ambulating.  Below are the results from that testing.     Patient Saturations on Room Air at Rest = spO2 94%  Patient Saturations on Room Air while Ambulating = sp02 93% .    At end of testing pt left in room on 0  Liters of oxygen.  Reported results to nurse.    Thompson Grayer Mobility Specialist  Please contact vis Secure Chat or  Rehab Office [phone]

## 2023-08-13 NOTE — Progress Notes (Signed)
Mobility Specialist Progress Note:   08/13/23 1054  Mobility  Activity Ambulated with assistance in hallway  Level of Assistance Contact guard assist, steadying assist  Assistive Device Four wheel walker  Distance Ambulated (ft) 200 ft  Activity Response Tolerated well  Mobility Referral Yes  $Mobility charge 1 Mobility  Mobility Specialist Start Time (ACUTE ONLY) 0945  Mobility Specialist Stop Time (ACUTE ONLY) 0959  Mobility Specialist Time Calculation (min) (ACUTE ONLY) 14 min   Pt received in bed, agreeable to ambulate. No c/o throughout, VSS. Assisted back to room w/ call bell and personal belongings in reach. Left seated at EOB and all needs met.  Pre Mobility RA SPO2 94% HR 117 During Mobility RA SPO2 93% HR 132 Post Mobility RA SPO2 93 HR 123  Thompson Grayer Mobility Specialist  Please Engineer, structural or  Rehab Office [phone]

## 2023-08-13 NOTE — Progress Notes (Addendum)
  Progress Note   Patient: [name] [id number] DOB: 1961-08-13 DOA: 08/11/2023     1 DOS: the patient was seen and examined on 08/13/2023 at 9:13 AM      Brief hospital course: Mrs. Donofrio is a 62 y.o. 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  COVID+.  CXR clear and on room air.     Assessment and Plan: * Acute exacerbation of chronic obstructive pulmonary disease (COPD) (HCC) Sepsis ruled out.  Lactic acid elevation due to albuterol use in respiratory disease  Remains on room air but severely dyspneic with just a few steps, unable to care for self at home - Continue solumedrol - Continue azithromycin - Continue ICS/LABA/LAMA, Zyrtec and Flonase - Continue Singulair - Flutter, IS - Continue PPI, Pepcid    COVID-19 virus infection Diagnosed >5 days ago, outside window for Paxlovid.  No infiltrates or hypoxia. - Supportive care  Transaminitis Due to COVID.  In setting of imaging finding of nodular liver - Plan to continue Azith, Abilify and Tegretol for now, trend LFTs - Hold statin    OSA (obstructive sleep apnea) Patient noncompliant with CPAP, woke up overnight dyspneic. - Continue CPAP at night  Obesity (BMI 30-39.9) BMI 32  Hypokalemia - Supplement K  Cirrhosis (HCC) Imaging finding, well compensated.  No ascites.  Essential hypertension BP slightly elevated.  - Continue hydrochlorothiazide and irbesartan  Hypophosphatemia Supplemented and resolved  Seizure disorder (HCC) See 09/2017 note from Dr. Arbutus Leas, presumptive diagnosis.  On Tegretol for Bipolar.  Bipolar disorder (HCC) - Continue Abilify and Tegretol - Trend LFTs  Chronic iron deficiency anemia Hgb stable relative to baseline  Peripheral vascular  disease (HCC) - Hold statin given LFTs  Headache(784.0) None at present.  Chronic.          Subjective: Patient woke up overnight extremely dyspneic, she is still wheezing.  She is still severely dyspneic with short distance ambulation.  No fever, no sputum, no confusion.     Physical Exam: BP (!) 152/92 (BP Location: Right Arm)   Pulse 97   Temp 98.5 F (36.9 C) (Oral)   Resp 20   Ht 5\' 3"  (1.6 m)   Wt 82.1 kg   SpO2 96%   BMI 32.08 kg/m   Obese adult female, sitting in bed, interactive and appropriate RRR, no murmurs, no peripheral edema Sounds dyspneic at rest, pursed lip breathing, lung sounds very tight and diminished, wheezing bilaterally Abdomen soft without tenderness palpation Attention normal, affect appropriate, judgment and insight appear normal    Data Reviewed: BC shows no leukocytosis due to steroids Basic metabolic panel shows potassium 3.0, phosphorus level resolved LFTs trending up      Disposition: Status is: Inpatient The patient was admitted for COPD flare in the setting of COVID  She has severe COPD/asthma at baseline, and at present reports that she is too dyspneic to care for herself at home.  Continue IV steroids, plan to transition to pills either tomorrow or the next day and discharge home with prolonged taper        Author: Alberteen Sam, MD 08/13/2023 10:45 AM  For on call review [email or web address].

## 2023-08-13 NOTE — Inpatient Diabetes Management (Signed)
Inpatient Diabetes Program Recommendations  AACE/ADA: New Consensus Statement on Inpatient Glycemic Control (2015)  Target Ranges:  Prepandial:   less than 140 mg/dL      Peak postprandial:   less than 180 mg/dL (1-2 hours)      Critically ill patients:  140 - 180 mg/dL   Lab Results  Component Value Date   GLUCAP 232 (H) 08/13/2023   HGBA1C 5.9 06/18/2023    Review of Glycemic Control  Latest Reference Range & Units 08/12/23 06:50 08/12/23 12:26 08/12/23 16:42 08/12/23 21:07 08/13/23 06:56 08/13/23 11:46  Glucose-Capillary 70 - 99 mg/dL 272 (H) 536 (H) 644 (H) 258 (H) 208 (H) 232 (H)  (H): Data is abnormally high  Diabetes history: Pre-DM Outpatient Diabetes medications: none Current orders for Inpatient glycemic control: Novolog 0-9 units TID and Solumedrol 80 mg BID   Inpatient Diabetes Program Recommendations:     Might consider: Semglee 12 units every day while receiving steroids.     Will continue to follow while inpatient.   Thank you, Dulce Sellar, MSN, CDCES Diabetes Coordinator Inpatient Diabetes Program [phone] (team pager from 8a-5p)

## 2023-08-14 ENCOUNTER — Inpatient Hospital Stay (HOSPITAL_COMMUNITY): Payer: Medicare HMO

## 2023-08-14 DIAGNOSIS — J441 Chronic obstructive pulmonary disease with (acute) exacerbation: Secondary | ICD-10-CM | POA: Diagnosis not present

## 2023-08-14 LAB — BASIC METABOLIC PANEL
Anion gap: 9 (ref 5–15)
BUN: 17 mg/dL (ref 6–20)
CO2: 33 mmol/L — ABNORMAL HIGH (ref 22–32)
Calcium: 8.5 mg/dL — ABNORMAL LOW (ref 8.9–10.3)
Chloride: 93 mmol/L — ABNORMAL LOW (ref 98–111)
Creatinine, Ser: 0.81 mg/dL (ref 0.44–1.00)
GFR, Estimated: >60 mL/min (ref >60)
Glucose, Bld: 162 mg/dL — ABNORMAL HIGH (ref 70–99)
Potassium: 3.2 mmol/L — ABNORMAL LOW (ref 3.5–5.1)
Sodium: 135 mmol/L (ref 135–145)

## 2023-08-14 LAB — CBC
HCT: 29.8 % — ABNORMAL LOW (ref 36.0–46.0)
Hemoglobin: 9.3 g/dL — ABNORMAL LOW (ref 12.0–15.0)
MCH: 25.9 pg — ABNORMAL LOW (ref 26.0–34.0)
MCHC: 31.2 g/dL (ref 30.0–36.0)
MCV: 83 fL (ref 80.0–100.0)
Platelets: 450 10*3/uL — ABNORMAL HIGH (ref 150–400)
RBC: 3.59 MIL/uL — ABNORMAL LOW (ref 3.87–5.11)
RDW: 15.3 % (ref 11.5–15.5)
WBC: 19.5 10*3/uL — ABNORMAL HIGH (ref 4.0–10.5)
nRBC: 0.1 % (ref 0.0–0.2)

## 2023-08-14 LAB — GLUCOSE, CAPILLARY
Glucose-Capillary: 132 mg/dL — ABNORMAL HIGH (ref 70–99)
Glucose-Capillary: 173 mg/dL — ABNORMAL HIGH (ref 70–99)
Glucose-Capillary: 188 mg/dL — ABNORMAL HIGH (ref 70–99)
Glucose-Capillary: 170 mg/dL — ABNORMAL HIGH (ref 70–99)

## 2023-08-14 LAB — HEPATIC FUNCTION PANEL
ALT: 135 U/L — ABNORMAL HIGH (ref 0–44)
AST: 61 U/L — ABNORMAL HIGH (ref 15–41)
Albumin: 3.4 g/dL — ABNORMAL LOW (ref 3.5–5.0)
Alkaline Phosphatase: 59 U/L (ref 38–126)
Bilirubin, Direct: <0.1 mg/dL (ref 0.0–0.2)
Total Bilirubin: 0.2 mg/dL — ABNORMAL LOW (ref 0.3–1.2)
Total Protein: 5.7 g/dL — ABNORMAL LOW (ref 6.5–8.1)

## 2023-08-14 LAB — AMMONIA: Ammonia: 43 umol/L — ABNORMAL HIGH (ref 9–35)

## 2023-08-14 LAB — PHOSPHORUS: Phosphorus: 3.5 mg/dL (ref 2.5–4.6)

## 2023-08-14 MED ORDER — HYDROCHLOROTHIAZIDE 25 MG PO TABS
25.0000 mg | ORAL_TABLET | Freq: Every day | ORAL | Status: DC
  Administered 2023-08-15: 25 mg via ORAL
  Filled 2023-08-14: qty 1

## 2023-08-14 MED ORDER — LABETALOL HCL 5 MG/ML IV SOLN: 10.0000 mg | INTRAVENOUS | Status: DC | PRN

## 2023-08-14 MED ORDER — IRBESARTAN 300 MG PO TABS
150.0000 mg | ORAL_TABLET | Freq: Every day | ORAL | Status: DC
  Administered 2023-08-15: 150 mg via ORAL
  Filled 2023-08-14: qty 1

## 2023-08-14 MED ORDER — IRBESARTAN 75 MG PO TABS
75.0000 mg | ORAL_TABLET | Freq: Once | ORAL | Status: AC
  Administered 2023-08-14: 75 mg via ORAL
  Filled 2023-08-14: qty 1

## 2023-08-14 MED ORDER — POTASSIUM CHLORIDE CRYS ER 20 MEQ PO TBCR
40.0000 meq | EXTENDED_RELEASE_TABLET | Freq: Once | ORAL | Status: AC
  Administered 2023-08-14: 40 meq via ORAL
  Filled 2023-08-14: qty 2

## 2023-08-14 NOTE — Plan of Care (Signed)
°  Problem: Coping: °Goal: Level of anxiety will decrease °Outcome: Progressing °  °

## 2023-08-14 NOTE — Significant Event (Signed)
Rapid Response Event Note   Reason for Call :  Neuro changes  Per RN, pt woke up this am stating she felt "drunk". She had an unwitnessed fall this am and was found by PT. Now with new confusion, agitation, and elevated BP.   Initial Focused Assessment:  On arrival, pt sitting in bed awake, moving all extremities with good strength throughout. She would not tell me her name but was able to have a conversation with clear speech. She was confused and hallucinating stating she saw a snake. She was attempting to get out of bed and began banging her head on the bed rail stating she does not drink or do drugs.   Vitals: HR 103, BP 141/110, RR 22, spO2 94% on RA.   Pt agitated and anxious during assessment with wheezing noted bilaterally.    Interventions:  Neb treatment- PTA CBG- 173 CT Head Bed rails padded Tele sitter Medications adjusted. Received hydroxyzine this am.    Plan of Care:  Continue to monitor pt neuro status. Keep bed rails padded and sitter for pt safety. RN to call with any changes or concerns.    Event Summary:   MD Notified: Dr Renford Dills notified at 0954 Call Time: 0936 Arrival Time: 0939 End Time: 1015  Update: CT Head negative. Pt still confused, Md ordered MRI.   Mordecai Rasmussen, RN

## 2023-08-14 NOTE — Progress Notes (Signed)
Primary nurse accompanied patient to CT scanner. Back to room. Bed in low position- bed alarm on. Fall mat in place. Bed rails remain padded.

## 2023-08-14 NOTE — Progress Notes (Signed)
PROGRESS NOTE  [name]  QVZ:[id number] DOB: 07/10/1961 DOA: 08/11/2023 PCP: Corwin Levins, MD   Brief Narrative: Patient is a 62 year old female with history of asthma/COPD with FEV1 of 2023 on Breztri and Dupixent, chronic urticaria, OSA on OSA nightly sleep CPAP, bipolar disorder, obesity, hypertension, seizure disorder, vocal cord paralysis, fibromyalgia and disability who presented with shortness of breath, nonproductive cough.  On presentation, COVID screening test came out to be positive.  Chest x-ray did not show any pneumonia.  She remains on room air but remained dyspneic.  She is concerned about unable to take care of herself at home.  There was concern for stroke this morning due to change in the mentation.  CT head normal  Assessment & Plan:  Principal Problem:   Acute exacerbation of chronic obstructive pulmonary disease (COPD) (HCC) Active Problems:   COVID-19 virus infection   Headache(784.0)   Peripheral vascular disease (HCC)   Chronic iron deficiency anemia   Bipolar disorder (HCC)   Seizure disorder (HCC)   Hypophosphatemia   Essential hypertension   Cirrhosis (HCC)   Hypokalemia   Obesity (BMI 30-39.9)   OSA (obstructive sleep apnea)   Transaminitis  Acute exacerbation of COPD: This is secondary to COVID infection.  Chest x-ray did not show any pneumonia.  Presented with shortness of breath, cough.  Currently on Solu-Medrol, azithromycin, bronchodilators, Zyrtec, Flonase, Singulair.  Continue intensive spirometry, flutter valve.  Continue PPI.  Has remained on room air.  COVID infection: Diagnosed about 5 days ago before admission.  Outside window for Paxil bid.  No infiltrates or hypoxia.  Continue supportive care.  Altered mentation: She was found to be confused and hypertensive this morning.  Rapid response was called.  I had examined earlier this morning and she was alert and oriented, she does not have any focal deficits.  She was complaining  of being drowsy.  She was on a scheduled hydroxyzine which has been discontinued.  CT head was done which did not show any acute findings.  Leukocytosis: This is most likely reactive secondary to steroids.  Continue to monitor .  Elevated liver enzymes: In the setting of COVID.  Continue to monitor.  Statin on hold  OSA: Continue CPAP at night.  Obesity: BMI of 32  Hypokalemia/hypophosphatemia: Being monitored and supplemented as needed  Cirrhosis: Well compensated.  No ascites  Hypertension: Blood pressure  elevated.  Continue HCTZ, ARB.  Continue as needed medication for severe hypertension.  Will add more medication if she continues to be hypertensive.  Seizure disorder: On Tegretol.  Bipolar disorder: On Abilify, Tegretol  Chronic iron deficiency  anemia: Current hemoglobin stable.  Peripheral vascular disease: Takes statin at home.  Currently on hold due to elevated LFT         DVT prophylaxis:enoxaparin (LOVENOX) injection 40 mg Start: 08/12/23 1000     Code Status: Full Code  Family Communication: None at the bedside  Patient status:Inpatient  Patient is from :Home  Anticipated discharge PI:RJJO  Estimated DC date:tomorrow   Consultants: None  Procedures:None  Antimicrobials:  Anti-infectives (From admission, onward)    Start     Dose/Rate Route Frequency Ordered Stop   08/12/23 0145  azithromycin (ZITHROMAX) 500 mg in sodium chloride 0.9 % 250 mL IVPB       Note to Pharmacy: (In setting of acute copd exac)   500 mg 250 mL/hr over 60 Minutes Intravenous Daily at bedtime 08/12/23 0123         Subjective:  Patient seen and examined at bedside today.  Hypertensive this morning for edema evaluation, she was alert and oriented, obeys commands and does not have any weakness.  She was complaining of some sleepiness/drowsiness.  She is on room air.  On auscultation, she had mild bilateral expiratory wheezing.  Objective: Vitals:   08/13/23 2055 08/13/23  2104 08/14/23 0039 08/14/23 0636  BP:    (!) 176/91  Pulse:  97  84  Resp:  20 19 18   Temp:   98.1 F (36.7 C) 98.2 F (36.8 C)  TempSrc:   Oral Oral  SpO2: 96% 96%  95%  Weight:    81 kg  Height:        Intake/Output Summary (Last 24 hours) at 08/14/2023 0721 Last data filed at 08/13/2023 1835 Gross per 24 hour  Intake 630 ml  Output --  Net 630 ml   Filed Weights   08/12/23 0336 08/14/23 0636  Weight: 82.1 kg 81 kg    Examination:  General exam: Overall comfortable, not in distress HEENT: PERRL Respiratory system: Severe diminished sounds bilaterally, no crackles, bilateral expiratory wheezings Cardiovascular system: S1 & S2 heard, RRR.  Gastrointestinal system: Abdomen is nondistended, soft and nontender. Central nervous system: Alert and oriented Extremities: No edema, no clubbing ,no cyanosis Skin: No rashes, no ulcers,no icterus     Data Reviewed: I have personally reviewed following labs and imaging studies  CBC: Recent Labs  Lab 08/11/23 0943 08/11/23 2130 08/11/23 2242 08/12/23 0231 08/12/23 0239 08/13/23 0552 08/14/23 0411  WBC 17.0* 13.1*  --  11.8*  --  20.0* 19.5*  NEUTROABS  --  11.0*  --  10.2*  --   --   --   HGB 11.1* 10.2* 11.6* 9.3* 9.2* 9.8* 9.3*  HCT 36.2 33.5* 34.0* 29.6* 27.0* 30.6* 29.8*  MCV 86.8 85.2  --  86.5  --  84.8 83.0  PLT 486* 512*  --  427*  --  495* 450*   Basic Metabolic Panel: Recent Labs  Lab 08/11/23 0943 08/11/23 2130 08/11/23 2242 08/12/23 0231 08/12/23 0239 08/13/23 0545 08/13/23 0552 08/14/23 0411  NA 141 139 139 136 137  --  138 135  K 2.7* 3.3* 3.4* 3.1* 4.2  --  3.0* 3.2*  CL 101 99  --  99  --   --  98 93*  CO2 25 22  --  25  --   --  29 33*  GLUCOSE 133* 346*  --  276*  --   --  228* 162*  BUN 17 16  --  14  --   --  13 17  CREATININE 0.82 1.06*  --  0.90  --   --  0.84 0.81  CALCIUM 9.4 9.5  --  8.5*  --   --  9.1 8.5*  MG  --   --   --  2.4  --   --   --   --   PHOS  --   --   --  1.9*  --   3.0  --  3.5     Recent Results (from the past 240 hour(s))  SARS Coronavirus 2 by RT PCR (hospital order, performed in [facility] hospital lab) *cepheid single result test* Anterior Nasal Swab     Status: Abnormal   Collection Time: 08/11/23  9:42 AM   Specimen: Anterior Nasal Swab  Result Value Ref Range Status   SARS Coronavirus 2 by RT PCR POSITIVE (A) NEGATIVE Final  Comment: Performed at [facility], [address] [place], [place] [address]  Resp panel by RT-PCR (RSV, Flu A&B, Covid) Anterior Nasal Swab     Status: Abnormal   Collection Time: 08/11/23  9:43 AM   Specimen: Anterior Nasal Swab  Result Value Ref Range Status   SARS Coronavirus 2 by RT PCR POSITIVE (A) NEGATIVE Final   Influenza A by PCR NEGATIVE NEGATIVE Final   Influenza B by PCR NEGATIVE NEGATIVE Final    Comment: (NOTE) The Xpert Xpress SARS-CoV-2/FLU/RSV plus assay is intended as an aid in the diagnosis of influenza from Nasopharyngeal swab specimens and should not be used as a sole basis for treatment. Nasal washings and aspirates are unacceptable for Xpert Xpress SARS-CoV-2/FLU/RSV testing.  Fact Sheet for Patients: BloggerCourse.com  Fact Sheet for Healthcare Providers: SeriousBroker.it  This test is not yet approved or cleared by the [place] FDA and has been authorized for detection and/or diagnosis of SARS-CoV-2 by FDA under an Emergency Use Authorization (EUA). This EUA will remain in effect (meaning this test can be used) for the duration of the COVID-19 declaration under Section 564(b)(1) of the Act, 21 U.S.C. section 360bbb-3(b)(1), unless the authorization is terminated or revoked.     Resp Syncytial Virus by PCR NEGATIVE NEGATIVE Final    Comment: (NOTE) Fact Sheet for Patients: BloggerCourse.com  Fact Sheet for Healthcare Providers: SeriousBroker.it  This test is not  yet approved or cleared by the [place] FDA and has been authorized for detection and/or diagnosis of SARS-CoV-2 by FDA under an Emergency Use Authorization (EUA). This EUA will remain in effect (meaning this test can be used) for the duration of the COVID-19 declaration under Section 564(b)(1) of the Act, 21 U.S.C. section 360bbb-3(b)(1), unless the authorization is terminated or revoked.  Performed at [facility], [address] [place], [place] [address]   Culture, blood (Routine X 2) w Reflex to ID Panel     Status: None (Preliminary result)   Collection Time: 08/12/23  1:58 AM   Specimen: BLOOD  Result Value Ref Range Status   Specimen Description BLOOD SITE NOT SPECIFIED  Final   Special Requests   Final    BOTTLES DRAWN AEROBIC AND ANAEROBIC Blood Culture adequate volume   Culture   Final    NO GROWTH < 12 HOURS Performed at [facility], [address], [place], [place] [address]    Report Status PENDING  Incomplete  Culture, blood (Routine X 2) w Reflex to ID Panel     Status: None (Preliminary result)   Collection Time: 08/12/23  4:57 AM   Specimen: BLOOD  Result Value Ref Range Status   Specimen Description BLOOD BLOOD RIGHT ARM  Final   Special Requests   Final    BOTTLES DRAWN AEROBIC AND ANAEROBIC Blood Culture adequate volume   Culture   Final    NO GROWTH < 12 HOURS Performed at [facility], [address], [place], [place] [address]    Report Status PENDING  Incomplete     Radiology Studies: No results found.  Scheduled Meds:  ARIPiprazole  10 mg Oral Daily   carbamazepine  400 mg Oral Daily   And   carbamazepine  600 mg Oral QHS   enoxaparin (LOVENOX) injection  40 mg Subcutaneous Daily   famotidine  40 mg Oral QHS   ferrous sulfate  325 mg Oral Q breakfast   gabapentin  100 mg Oral TID  hydrochlorothiazide  12.5 mg Oral Daily   hydrOXYzine  25 mg Oral BID   insulin aspart  0-9 Units Subcutaneous TID WC    ipratropium-albuterol  3 mL Nebulization BID   irbesartan  75 mg Oral Daily   loratadine  10 mg Oral Daily   methylPREDNISolone (SOLU-MEDROL) injection  80 mg Intravenous BID   mometasone-formoterol  2 puff Inhalation BID   montelukast  10 mg Oral QHS   pantoprazole  40 mg Oral BID   potassium chloride  40 mEq Oral BID   Continuous Infusions:  azithromycin 500 mg (08/13/23 2148)     LOS: 2 days   Burnadette Pop, MD [facility] P8/28/2024, 7:21 AM

## 2023-08-14 NOTE — Progress Notes (Signed)
Upon entering room with breakfast and medications, patient reports she feels like she is "drunk". Starts c/o about how the food is cold. Requesting if the doctor can give her something to help her not to feel "drunk".

## 2023-08-14 NOTE — Progress Notes (Addendum)
Primary nurse called into room. Charge nurse present. PT called charge nurse and stated patient was confused and hypertensive. Rapid Response nurse present at bedside.  Mental status change noted from this morning. Patient intermittently following commands, liable emotions, excessive tongue movements at times, and c/o of being thirsty. Patient reported seeing a snake. She was informed that there is not a snake in the room.  Communication nonsensical. CBG 173. Last BP 141/110 (MAP 119). Patient started to bang head against rail- seizure pads placed on bed rails. Order placed for STAT CT of head. Discussed getting a Holiday representative for patient. Primary nurse still present in room with patient at this time. Prn zofran given for nausea and dry heaving. Patient keep saying " I'm drunk. I feel drunk". Patient requesting a blanket- provided. Appears calmer now.  Bed alarm remains on.

## 2023-08-14 NOTE — Evaluation (Addendum)
Physical Therapy Evaluation Patient Details Name: [name] MRN: [id number] DOB: [birth year] Today's Date: 08/14/2023  History of Present Illness  [name] is a 62 y.o. F admitted 8/25 presenting with COPD flare after failed outpatient management.  COVID positive.  Elevated BP 8/28 with new onset symptoms causing imbalance and incr WOB.  Nurse called to room during therapy session.  CT ordered 8/28 for further assessment. 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 Pt presenting differently during evaluation than previous function per mobility session yesterday.  On arrival, pt with inability to sit EOB without min assist with posterior bias and stating, "I'm drunk."  Pt with poor coordination of LEs in standing to get to 3N1 as well as impulsivity noted.  Pt's BP elevated at 204/141 therefore called nurse to come and assess pt.  Pt also with direction changing nystagmus upon oculomotor assessment.  Pt tearful and had incr WOB toward end of evaluation with nurse and RT coming to pts room. RT gave pt breathing treatment and nurse assessing pt. No further evaluation by PT as nurse was messaging MD regarding pt symptoms.  Will return at later date and continue therapy as indicated.   Pt currently with functional limitations due to the deficits listed below (see PT Problem List). Pt will benefit from acute skilled PT to increase their independence and safety with mobility to allow discharge.           If plan is discharge home, recommend the following: A lot of help with walking and/or transfers;A little help with bathing/dressing/bathroom;Assistance with cooking/housework;Assist for transportation;Help with stairs or ramp for entrance   Can travel by private vehicle         Equipment Recommendations None recommended by PT  Recommendations for Other Services  Rehab consult    Functional Status Assessment Patient has had a recent decline in their functional status and demonstrates the ability to make significant improvements in function in a reasonable and predictable amount of time.     Precautions / Restrictions Precautions Precautions: Fall Restrictions Weight Bearing Restrictions: No      Mobility  Bed Mobility Overal bed mobility: Needs Assistance Bed Mobility: Rolling, Sidelying to Sit Rolling: Min assist Sidelying to sit: Min assist       General bed mobility comments: Bed alarm going off on arrival as pt needed to use bathroom.  Pt held onto therapist to sit up however had an abrupt LOB posteriorly upon sitting needing max assist to sit back up. Pt then stated "I'm drunk" and needed min assist on shoulder to keep balance at EOB.  Pt wanted to get to 3N1.    Transfers Overall transfer level: Needs assistance Equipment used: Rolling walker (2 wheels) Transfers: Sit to/from Stand, Bed to chair/wheelchair/BSC Sit to Stand: Min assist, +2 safety/equipment   Step pivot transfers: Min assist, +2 safety/equipment       General transfer comment: Pt able to stand to RW with min assist and cues for hand placement.  Pt with poor coordination of LE movement during transfer needing +2 min assist for safety. Pt impulsive with movements and had trouble following commands with poor motor planning.  Pt did not immediately use bathroom until cued that she was on toilet. Also, needed assist to be cleaned.  Pt also wiped her face with the  cloth before she recalled that cloth was given to clean her perineal area.  Pt took pivotal steps back to bed.    Ambulation/Gait                  Stairs            Wheelchair Mobility     Tilt Bed    Modified Rankin (Stroke Patients Only)       Balance Overall balance assessment: Needs  assistance Sitting-balance support: Single extremity supported, Bilateral upper extremity supported, No upper extremity supported, Feet supported Sitting balance-Leahy Scale: Poor Sitting balance - Comments: Pt had 1 significant LOB posteriorly needing min assist for sitting Postural control: Posterior lean Standing balance support: Bilateral upper extremity supported, During functional activity Standing balance-Leahy Scale: Poor Standing balance comment: Pt needing RW and external support for static stance and dynamic                             Pertinent Vitals/Pain Pain Assessment Pain Assessment: No/denies pain    Home Living Family/patient expects to be discharged to:: Private residence Living Arrangements: Alone Available Help at Discharge: Family;Available PRN/intermittently (27 year old grandson - in school) Type of Home: Apartment Home Access: Level entry       Home Layout: One level Home Equipment: Agricultural consultant (2 wheels);Cane - single point;Shower seat Additional Comments: Pt states she falls "all the time"    Prior Function Prior Level of Function : Needs assist;History of Falls (last six months)             Mobility Comments: uses RW ADLs Comments: grandson assists with cooking/cleaning on the weekend, sister assists pt with bathing     Extremity/Trunk Assessment   Upper Extremity Assessment Upper Extremity Assessment: Right hand dominant;Overall WFL for tasks assessed;RUE deficits/detail RUE Coordination: decreased fine motor (unsure if pt understood commands for testing coordination fingers.)    Lower Extremity Assessment Lower Extremity Assessment: Overall WFL for tasks assessed (unable to fully assess due to incr BP and other symptoms)    Cervical / Trunk Assessment Cervical / Trunk Assessment: Other exceptions (posterior bias at times and sudden)  Communication   Communication Communication: Other (comment) (Vocal cord dysfunction  per chart, could understand speech)  Cognition Arousal: Alert Behavior During Therapy: WFL for tasks assessed/performed Overall Cognitive Status: Impaired/Different from baseline Area of Impairment: Safety/judgement, Problem solving, Following commands                       Following Commands: Follows one step commands with increased time Safety/Judgement: Decreased awareness of safety, Decreased awareness of deficits   Problem Solving: Difficulty sequencing, Requires verbal cues, Decreased initiation, Slow processing General Comments: Pt with fall this am prior to PT session.  Pt somewhat distraught and crying once she stood to get to toilet as she kept stating she was "drunk" and she was upset about this.  "I am going to die."  Tried to help pt relax and encouraged her to not worry.        General Comments General comments (skin integrity, edema, etc.): Brief oculomotor testing revealed smooth pursuits with noted direction changing nystagmus. Pt having difficulty following testing commands making assessment difficult.    Exercises     Assessment/Plan    PT Assessment Patient needs continued PT services  PT Problem List Decreased activity tolerance;Decreased balance;Decreased mobility;Decreased coordination;Decreased cognition;Decreased knowledge of use of DME;Decreased safety awareness;Decreased  knowledge of precautions;Cardiopulmonary status limiting activity;Obesity       PT Treatment Interventions DME instruction;Gait training;Functional mobility training;Therapeutic activities;Therapeutic exercise;Balance training;Neuromuscular re-education;Cognitive remediation;Patient/family education    PT Goals (Current goals can be found in the Care Plan section)  Acute Rehab PT Goals Patient Stated Goal: to feel better PT Goal Formulation: With patient Time For Goal Achievement: 08/28/23 Potential to Achieve Goals: Good    Frequency Min 1X/week     Co-evaluation                AM-PAC PT "6 Clicks" Mobility  Outcome Measure Help needed turning from your back to your side while in a flat bed without using bedrails?: A Little Help needed moving from lying on your back to sitting on the side of a flat bed without using bedrails?: A Little Help needed moving to and from a bed to a chair (including a wheelchair)?: Total Help needed standing up from a chair using your arms (e.g., wheelchair or bedside chair)?: Total Help needed to walk in hospital room?: Total Help needed climbing 3-5 steps with a railing? : Total 6 Click Score: 10    End of Session Equipment Utilized During Treatment: Gait belt Activity Tolerance: Patient limited by fatigue (elevated BP and motor processing issues) Patient left: in bed;with call bell/phone within reach;with bed alarm set;with nursing/sitter in room Nurse Communication: Other (comment) (Pt with new onset incoordination, direction changing nystagmus with oculomotor testing and significantly high BP) PT Visit Diagnosis: Unsteadiness on feet (R26.81);History of falling (Z91.81);Dizziness and giddiness (R42)    Time: 4098-1191 PT Time Calculation (min) (ACUTE ONLY): 22 min   Charges:   PT Evaluation $PT Eval Moderate Complexity: 1 Mod   PT General Charges $$ ACUTE PT VISIT: 1 Visit         Aemilia Dedrick M,PT Acute Rehab Services [phone]   Bevelyn Buckles 08/14/2023, 10:05 AM

## 2023-08-14 NOTE — Progress Notes (Addendum)
Lunch tray brought into patient's room and medications. Patient able to state first name. Unable to tell me birthdate and age- still confused. When told that she has to take her potassium, she stated " who said that?" She was informed that MD ordered it because her potassium is low. Patient took medications without difficulty. Eating without difficulty. Still c/o that she feels drunk. States she want to call her sister. MRI ordered. Patient informed she is going for another scan of her head. Before leaving room, patient stated " who is that in the corner of my room? Tell them to leave". Patient informed that is just the nurse and her in the room. Patient stated " you wouldn't lie to me would you?" Patient reassured that no one else is present in the room.   Attempted to contact emergency contacts- no answer. Voicemail left with patient's daughter Dale Hume.

## 2023-08-14 NOTE — Progress Notes (Signed)
Remote observation (telesitter) placed into patient's room. Report call.

## 2023-08-14 NOTE — Progress Notes (Signed)
Inpatient Rehab Admissions Coordinator Note:   Per therapy recommendations patient was screened for CIR candidacy by Stephania Fragmin, PT. At this time, pt appears to be a potential candidate for CIR. I will place an order for rehab consult for full assessment, per our protocol.  Please contact me any with questions.Estill Dooms, PT, DPT [phone] 08/14/23 3:03 PM

## 2023-08-14 NOTE — Progress Notes (Signed)
Patient back from MRI.

## 2023-08-14 NOTE — Progress Notes (Signed)
Received call from nurse aide that patient had fell. Upon entering room, patient sitting on edge of bed. She reports that she was reaching for something and slid off bed. States she did not hit her head. MD informed. VS obtained. Patient informed to call for assistance.

## 2023-08-14 NOTE — Progress Notes (Signed)
Patient's behavior childlike. Excessive smacking of lips Patient eating dinner and took medications without difficulty. MRI negative. Hospitalist asked if patient would benefit from pysch consult, per MD patient hemodynamically stable. Plan to continue to monitor. Could be a possible side effect of medications : Solumedrol and gabapentin discontinued. Ammonia level to be checked.

## 2023-08-14 NOTE — Progress Notes (Addendum)
1358: Emotions liable. Patient crying at bedside. States she want to talk with her sister. She was informed that I tried calling her sister earlier-no answer. Will attempt to call sister again.   1412: Attempted to contact sister- no answer. VM left.

## 2023-08-15 ENCOUNTER — Other Ambulatory Visit (HOSPITAL_COMMUNITY): Payer: Self-pay

## 2023-08-15 ENCOUNTER — Encounter (HOSPITAL_COMMUNITY): Payer: Self-pay | Admitting: Internal Medicine

## 2023-08-15 ENCOUNTER — Ambulatory Visit (HOSPITAL_COMMUNITY): Payer: Medicare HMO

## 2023-08-15 DIAGNOSIS — T380X5A Adverse effect of glucocorticoids and synthetic analogues, initial encounter: Secondary | ICD-10-CM | POA: Diagnosis not present

## 2023-08-15 DIAGNOSIS — F060 Psychotic disorder with hallucinations due to known physiological condition: Secondary | ICD-10-CM | POA: Diagnosis not present

## 2023-08-15 DIAGNOSIS — J441 Chronic obstructive pulmonary disease with (acute) exacerbation: Secondary | ICD-10-CM | POA: Diagnosis not present

## 2023-08-15 DIAGNOSIS — G9340 Encephalopathy, unspecified: Secondary | ICD-10-CM | POA: Diagnosis not present

## 2023-08-15 DIAGNOSIS — F319 Bipolar disorder, unspecified: Secondary | ICD-10-CM | POA: Diagnosis not present

## 2023-08-15 DIAGNOSIS — F19950 Other psychoactive substance use, unspecified with psychoactive substance-induced psychotic disorder with delusions: Secondary | ICD-10-CM | POA: Diagnosis present

## 2023-08-15 LAB — GLUCOSE, CAPILLARY
Glucose-Capillary: 125 mg/dL — ABNORMAL HIGH (ref 70–99)
Glucose-Capillary: 117 mg/dL — ABNORMAL HIGH (ref 70–99)
Glucose-Capillary: 137 mg/dL — ABNORMAL HIGH (ref 70–99)
Glucose-Capillary: 146 mg/dL — ABNORMAL HIGH (ref 70–99)

## 2023-08-15 LAB — COMPREHENSIVE METABOLIC PANEL
ALT: 139 U/L — ABNORMAL HIGH (ref 0–44)
AST: 41 U/L (ref 15–41)
Albumin: 3.7 g/dL (ref 3.5–5.0)
Alkaline Phosphatase: 66 U/L (ref 38–126)
Anion gap: 12 (ref 5–15)
BUN: 13 mg/dL (ref 6–20)
CO2: 33 mmol/L — ABNORMAL HIGH (ref 22–32)
Calcium: 8.6 mg/dL — ABNORMAL LOW (ref 8.9–10.3)
Chloride: 91 mmol/L — ABNORMAL LOW (ref 98–111)
Creatinine, Ser: 0.86 mg/dL (ref 0.44–1.00)
GFR, Estimated: >60 mL/min (ref >60)
Glucose, Bld: 153 mg/dL — ABNORMAL HIGH (ref 70–99)
Potassium: 3 mmol/L — ABNORMAL LOW (ref 3.5–5.1)
Sodium: 136 mmol/L (ref 135–145)
Total Bilirubin: <0.1 mg/dL — ABNORMAL LOW (ref 0.3–1.2)
Total Protein: 6.4 g/dL — ABNORMAL LOW (ref 6.5–8.1)

## 2023-08-15 LAB — CBC
HCT: 36.3 % (ref 36.0–46.0)
Hemoglobin: 11.3 g/dL — ABNORMAL LOW (ref 12.0–15.0)
MCH: 26.2 pg (ref 26.0–34.0)
MCHC: 31.1 g/dL (ref 30.0–36.0)
MCV: 84 fL (ref 80.0–100.0)
Platelets: 484 10*3/uL — ABNORMAL HIGH (ref 150–400)
RBC: 4.32 MIL/uL (ref 3.87–5.11)
RDW: 15.6 % — ABNORMAL HIGH (ref 11.5–15.5)
WBC: 17.8 10*3/uL — ABNORMAL HIGH (ref 4.0–10.5)
nRBC: 0.1 % (ref 0.0–0.2)

## 2023-08-15 LAB — VITAMIN B12: Vitamin B-12: 582 pg/mL (ref 180–914)

## 2023-08-15 LAB — FOLATE: Folate: 10.3 ng/mL (ref >5.9)

## 2023-08-15 LAB — RPR: RPR Ser Ql: NONREACTIVE

## 2023-08-15 MED ORDER — POTASSIUM CHLORIDE CRYS ER 20 MEQ PO TBCR
40.0000 meq | EXTENDED_RELEASE_TABLET | ORAL | Status: AC
  Administered 2023-08-15 (×2): 40 meq via ORAL
  Filled 2023-08-15 (×2): qty 2

## 2023-08-15 MED ORDER — POLYETHYLENE GLYCOL 3350 17 G PO PACK
17.0000 g | PACK | Freq: Every day | ORAL | Status: DC
  Administered 2023-08-15 – 2023-08-16 (×2): 17 g via ORAL
  Filled 2023-08-15 (×5): qty 1

## 2023-08-15 MED ORDER — LACTULOSE 10 GM/15ML PO SOLN
10.0000 g | Freq: Three times a day (TID) | ORAL | Status: DC
  Administered 2023-08-15 – 2023-08-18 (×6): 10 g via ORAL
  Filled 2023-08-15 (×8): qty 15

## 2023-08-15 MED ORDER — SENNA 8.6 MG PO TABS
1.0000 | ORAL_TABLET | Freq: Two times a day (BID) | ORAL | Status: DC
  Administered 2023-08-15 – 2023-08-20 (×6): 8.6 mg via ORAL
  Filled 2023-08-15 (×8): qty 1

## 2023-08-15 MED ORDER — ARIPIPRAZOLE 5 MG PO TABS
20.0000 mg | ORAL_TABLET | Freq: Every day | ORAL | Status: DC
  Administered 2023-08-16 – 2023-08-20 (×5): 20 mg via ORAL
  Filled 2023-08-15 (×5): qty 4

## 2023-08-15 MED ORDER — ARIPIPRAZOLE 5 MG PO TABS
10.0000 mg | ORAL_TABLET | Freq: Once | ORAL | Status: AC
  Administered 2023-08-15: 10 mg via ORAL
  Filled 2023-08-15: qty 2

## 2023-08-15 NOTE — Progress Notes (Addendum)
PROGRESS NOTE  [name]  OZD:[id number] DOB: [birth year] DOA: 08/11/2023 PCP: Corwin Levins, MD   Brief Narrative: Patient is a 62 year old female with history of asthma/COPD with FEV1 of 2023 on Breztri and Dupixent, chronic urticaria, OSA on OSA nightly sleep CPAP, bipolar disorder, obesity, hypertension, seizure disorder, vocal cord paralysis, fibromyalgia and disability who presented with shortness of breath, nonproductive cough.  On presentation, COVID screening test came out to be positive.  Chest x-ray did not show any pneumonia.  Hospital course remarkable for persistent dyspnea despite being on room air.  She became confused, psychotic since 8/28.  CT head/MRI of the brain negative for acute findings.  Psychiatry consulted today.  Steroid discontinued  Assessment & Plan:  Principal Problem:   Acute exacerbation of chronic obstructive pulmonary disease (COPD) (HCC) Active Problems:   COVID-19 virus infection   Headache(784.0)   Peripheral vascular disease (HCC)   Chronic iron deficiency anemia   Bipolar disorder (HCC)   Seizure disorder (HCC)   Hypophosphatemia   Essential hypertension   Cirrhosis (HCC)   Hypokalemia   Obesity (BMI 30-39.9)   OSA (obstructive sleep apnea)   Transaminitis  Acute exacerbation of COPD: This is secondary to COVID infection.  Chest x-ray did not show any pneumonia.  Presented with shortness of breath, cough. She was on  Solu-Medrol, azithromycin, bronchodilators, Zyrtec, Flonase, Singulair.  Continue intensive spirometry, flutter valve.  Continue PPI.She  remained on room air.  Steroids discontinued due to concern for psychosis.  No wheezing today  COVID infection: Diagnosed about 5 days ago before admission.  Outside window for Paxlovid.  No infiltrates or hypoxia.  Continue supportive care.  Altered mentation /psychosis: She was found to be confused on 8/28.  Rapid response was called.She does not have any focal deficits.  She was  complaining of being drowsy.  She was on a scheduled hydroxyzine which has been discontinued.  CT head was done which did not show any acute findings.  MRI of the brain did not show any acute findings.  Mildly elevated ammonia which may not have contributed to confusion.  Steroid discontinued due to concern for steroid induced psychosis.  We have requested for psychiatry assistance  Leukocytosis: This is most likely reactive secondary to steroids.  Continue to monitor .  Improving  Elevated liver enzymes: In the setting of COVID.  Continue to monitor.  Statin on hold  OSA: Continue CPAP at night.  Obesity: BMI of 32  Hypokalemia/hypophosphatemia: Being monitored and supplemented as needed  Cirrhosis: Well compensated.  No ascites  Hypertension: Dose of thiazide, ARB increased.  Blood pressure better today.  Seizure disorder: On Tegretol.  Bipolar disorder: On Abilify, Tegretol.  Psychiatric related  Chronic iron deficiency  anemia: Current hemoglobin stable.  Peripheral vascular disease: Takes statin at home.  Currently on hold due to elevated LFT         DVT prophylaxis:enoxaparin (LOVENOX) injection 40 mg Start: 08/12/23 1000     Code Status: Full Code  Family Communication: called the daughter Alycia Rossetti on her phone 3 times today, call not received  Patient status:Inpatient  Patient is from :Home  Anticipated discharge QV:ZDGL  Estimated DC date: After resolution of confusion   Consultants: Psychiatry  Procedures:None  Antimicrobials:  Anti-infectives (From admission, onward)    Start     Dose/Rate Route Frequency Ordered Stop   08/12/23 0145  azithromycin (ZITHROMAX) 500 mg in sodium chloride 0.9 % 250 mL IVPB       Note  to Pharmacy: (In setting of acute copd exac)   500 mg 250 mL/hr over 60 Minutes Intravenous Daily at bedtime 08/12/23 0123         Subjective: Patient seen and examined at bedside today.  She remains confused.  Acting bizarrely.  She  remains comfortable.  On room air.  No wheezing auscultated today.  Appears emotional.  Not in any kind of distress  Objective: Vitals:   08/15/23 0545 08/15/23 0726 08/15/23 0850 08/15/23 0953  BP: 121/69 131/83  139/81  Pulse: 86 99 91 86  Resp: 19 17 18 18   Temp: 98.4 F (36.9 C) 98.8 F (37.1 C)  98.8 F (37.1 C)  TempSrc: Oral Oral  Oral  SpO2: 91% 92% 94% 95%  Weight: 83.5 kg     Height:        Intake/Output Summary (Last 24 hours) at 08/15/2023 1123 Last data filed at 08/15/2023 0953 Gross per 24 hour  Intake 1057.41 ml  Output 1400 ml  Net -342.59 ml   Filed Weights   08/12/23 0336 08/14/23 0636 08/15/23 0545  Weight: 82.1 kg 81 kg 83.5 kg    Examination:  General exam: Overall comfortable, not in distress HEENT: PERRL Respiratory system:  no wheezes or crackles , diminished air sounds bilaterally Cardiovascular system: S1 & S2 heard, RRR.  Gastrointestinal system: Abdomen is nondistended, soft and nontender. Central nervous system: Alert and awake follows commands but not oriented. Apathic Extremities: No edema, no clubbing ,no cyanosis Skin: No rashes, no ulcers,no icterus     Data Reviewed: I have personally reviewed following labs and imaging studies  CBC: Recent Labs  Lab 08/11/23 2130 08/11/23 2242 08/12/23 0231 08/12/23 0239 08/13/23 0552 08/14/23 0411 08/15/23 0345  WBC 13.1*  --  11.8*  --  20.0* 19.5* 17.8*  NEUTROABS 11.0*  --  10.2*  --   --   --   --   HGB 10.2*   < > 9.3* 9.2* 9.8* 9.3* 11.3*  HCT 33.5*   < > 29.6* 27.0* 30.6* 29.8* 36.3  MCV 85.2  --  86.5  --  84.8 83.0 84.0  PLT 512*  --  427*  --  495* 450* 484*   < > = values in this interval not displayed.   Basic Metabolic Panel: Recent Labs  Lab 08/11/23 2130 08/11/23 2242 08/12/23 0231 08/12/23 0239 08/13/23 0545 08/13/23 0552 08/14/23 0411 08/15/23 0345  NA 139   < > 136 137  --  138 135 136  K 3.3*   < > 3.1* 4.2  --  3.0* 3.2* 3.0*  CL 99  --  99  --   --  98  93* 91*  CO2 22  --  25  --   --  29 33* 33*  GLUCOSE 346*  --  276*  --   --  228* 162* 153*  BUN 16  --  14  --   --  13 17 13   CREATININE 1.06*  --  0.90  --   --  0.84 0.81 0.86  CALCIUM 9.5  --  8.5*  --   --  9.1 8.5* 8.6*  MG  --   --  2.4  --   --   --   --   --   PHOS  --   --  1.9*  --  3.0  --  3.5  --    < > = values in this interval not displayed.  Recent Results (from the past 240 hour(s))  SARS Coronavirus 2 by RT PCR (hospital order, performed in [facility] hospital lab) *cepheid single result test* Anterior Nasal Swab     Status: Abnormal   Collection Time: 08/11/23  9:42 AM   Specimen: Anterior Nasal Swab  Result Value Ref Range Status   SARS Coronavirus 2 by RT PCR POSITIVE (A) NEGATIVE Final    Comment: Performed at [facility], [address] [place], [place] [address]  Resp panel by RT-PCR (RSV, Flu A&B, Covid) Anterior Nasal Swab     Status: Abnormal   Collection Time: 08/11/23  9:43 AM   Specimen: Anterior Nasal Swab  Result Value Ref Range Status   SARS Coronavirus 2 by RT PCR POSITIVE (A) NEGATIVE Final   Influenza A by PCR NEGATIVE NEGATIVE Final   Influenza B by PCR NEGATIVE NEGATIVE Final    Comment: (NOTE) The Xpert Xpress SARS-CoV-2/FLU/RSV plus assay is intended as an aid in the diagnosis of influenza from Nasopharyngeal swab specimens and should not be used as a sole basis for treatment. Nasal washings and aspirates are unacceptable for Xpert Xpress SARS-CoV-2/FLU/RSV testing.  Fact Sheet for Patients: BloggerCourse.com  Fact Sheet for Healthcare Providers: SeriousBroker.it  This test is not yet approved or cleared by the [place] FDA and has been authorized for detection and/or diagnosis of SARS-CoV-2 by FDA under an Emergency Use Authorization (EUA). This EUA will remain in effect (meaning this test can be used) for the duration of the COVID-19 declaration under Section  564(b)(1) of the Act, 21 U.S.C. section 360bbb-3(b)(1), unless the authorization is terminated or revoked.     Resp Syncytial Virus by PCR NEGATIVE NEGATIVE Final    Comment: (NOTE) Fact Sheet for Patients: BloggerCourse.com  Fact Sheet for Healthcare Providers: SeriousBroker.it  This test is not yet approved or cleared by the [place] FDA and has been authorized for detection and/or diagnosis of SARS-CoV-2 by FDA under an Emergency Use Authorization (EUA). This EUA will remain in effect (meaning this test can be used) for the duration of the COVID-19 declaration under Section 564(b)(1) of the Act, 21 U.S.C. section 360bbb-3(b)(1), unless the authorization is terminated or revoked.  Performed at [facility], [address], [place], [place] [address]   Culture, blood (Routine X 2) w Reflex to ID Panel     Status: None (Preliminary result)   Collection Time: 08/12/23  1:58 AM   Specimen: BLOOD  Result Value Ref Range Status   Specimen Description BLOOD SITE NOT SPECIFIED  Final   Special Requests   Final    BOTTLES DRAWN AEROBIC AND ANAEROBIC Blood Culture adequate volume   Culture   Final    NO GROWTH 3 DAYS Performed at [facility], [address], [place], [place] [address]    Report Status PENDING  Incomplete  Culture, blood (Routine X 2) w Reflex to ID Panel     Status: None (Preliminary result)   Collection Time: 08/12/23  4:57 AM   Specimen: BLOOD  Result Value Ref Range Status   Specimen Description BLOOD BLOOD RIGHT ARM  Final   Special Requests   Final    BOTTLES DRAWN AEROBIC AND ANAEROBIC Blood Culture adequate volume   Culture   Final    NO GROWTH 3 DAYS Performed at [facility], [address] [place], [place] [address]    Report Status PENDING  Incomplete     Radiology Studies: DG  Abd 1 View  Result Date: 08/14/2023 CLINICAL DATA:  Confusion EXAM: ABDOMEN - 1 VIEW COMPARISON:   05/17/2016 FINDINGS: Supine frontal views of the abdomen and pelvis are obtained. Bowel gas pattern is unremarkable without obstruction or ileus. Moderate retained stool throughout the colon. No masses or abnormal calcifications. Lung bases are clear. No acute bony abnormalities. IMPRESSION: 1. No bowel obstruction or ileus. 2. Moderate retained stool throughout the colon. Electronically Signed   By: Sharlet Salina M.D.   On: 08/14/2023 16:25   MR BRAIN WO CONTRAST  Result Date: 08/14/2023 CLINICAL DATA:  Delirium.  Altered mental status. EXAM: MRI HEAD WITHOUT CONTRAST TECHNIQUE: Multiplanar, multiecho pulse sequences of the brain and surrounding structures were obtained without intravenous contrast. COMPARISON:  MR head without contrast 12/02/2020 FINDINGS: Brain: The study is somewhat degraded by patient motion. Periventricular and subcortical T2 hyperintensities are similar the prior exam. No acute infarct, hemorrhage, or mass lesion is present. The brainstem and cerebellum are within normal limits. The internal auditory canals are within normal limits. Midline structures are within normal limits. Vascular: Flow is present in the major intracranial arteries. Skull and upper cervical spine: The T1 sagittal images are moderately degraded by patient motion. Craniocervical junction is grossly within normal limits. Sinuses/Orbits: The paranasal sinuses and mastoid air cells are clear. The globes and orbits are within normal limits. IMPRESSION: 1. No acute intracranial abnormality or significant interval change. 2. Periventricular and subcortical T2 hyperintensities bilaterally are stable. The finding is nonspecific but can be seen in the setting of chronic microvascular ischemia, a demyelinating process such as multiple sclerosis, vasculitis, complicated migraine headaches, or as the sequelae of a prior infectious or inflammatory process. Electronically Signed   By: Marin Roberts M.D.   On: 08/14/2023  15:53   CT HEAD WO CONTRAST ( )  Result Date: 08/14/2023 CLINICAL DATA:  Mental status change, unknown cause. EXAM: CT HEAD WITHOUT CONTRAST TECHNIQUE: Contiguous axial images were obtained from the base of the skull through the vertex without intravenous contrast. RADIATION DOSE REDUCTION: This exam was performed according to the departmental dose-optimization program which includes automated exposure control, adjustment of the mA and/or kV according to patient size and/or use of iterative reconstruction technique. COMPARISON:  Head CT 09/12/2022. FINDINGS: Brain: No acute intracranial hemorrhage. Stable mild chronic small-vessel disease. Cortical gray-white differentiation is preserved. No hydrocephalus or extra-axial collection. No mass effect or midline shift. Vascular: No hyperdense vessel or unexpected calcification. Skull: No calvarial fracture or suspicious bone lesion. Skull base is unremarkable. Sinuses/Orbits: No acute finding. Other: None. IMPRESSION: 1. No acute intracranial abnormality. 2. Stable mild chronic small-vessel disease. Electronically Signed   By: Orvan Falconer M.D.   On: 08/14/2023 11:00    Scheduled Meds:  ARIPiprazole  10 mg Oral Daily   carbamazepine  400 mg Oral Daily   And   carbamazepine  600 mg Oral QHS   enoxaparin (LOVENOX) injection  40 mg Subcutaneous Daily   famotidine  40 mg Oral QHS   ferrous sulfate  325 mg Oral Q breakfast   hydrochlorothiazide  25 mg Oral Daily   insulin aspart  0-9 Units Subcutaneous TID WC   ipratropium-albuterol  3 mL Nebulization BID   irbesartan  150 mg Oral Daily   lactulose  10 g Oral TID   loratadine  10 mg Oral Daily   mometasone-formoterol  2 puff Inhalation BID   montelukast  10 mg Oral QHS   pantoprazole  40 mg Oral BID   Continuous Infusions:  azithromycin Stopped (08/15/23 0002)     LOS: 3 days   Burnadette Pop, MD [facility] P8/29/2024, 11:23 AM

## 2023-08-15 NOTE — Progress Notes (Signed)
PT Cancellation Note  Patient Details Name: [name] MRN: [id number] DOB: 1961/07/18   Cancelled Treatment:    Reason Eval/Treat Not Completed: Medical issues which prohibited therapy (Nurse asked PT to HOLD as pt having hallucinations and behavioral issues. Will return at later date.)   Bevelyn Buckles 08/15/2023, 1:23 PM [place] Pickelsimer M,PT Acute Rehab Services [phone]

## 2023-08-15 NOTE — Care Management Important Message (Signed)
Important Message  Patient Details  Name: [name] MRN: [id number] Date of Birth: Apr 25, 1961   Medicare Important Message Given:  Yes     Renie Ora 08/15/2023, 8:09 AM

## 2023-08-15 NOTE — Progress Notes (Signed)
Responded to consult for IV. On arrival, nurse stated no longer needed. Consult cleared.

## 2023-08-15 NOTE — Plan of Care (Signed)
  Problem: Education: Goal: Knowledge of risk factors and measures for prevention of condition will improve Outcome: Not Progressing   Problem: Coping: Goal: Psychosocial and spiritual needs will be supported Outcome: Not Progressing   Problem: Respiratory: Goal: Will maintain a patent airway Outcome: Not Progressing Goal: Complications related to the disease process, condition or treatment will be avoided or minimized Outcome: Not Progressing   Problem: Education: Goal: Ability to describe self-care measures that may prevent or decrease complications (Diabetes Survival Skills Education) will improve Outcome: Not Progressing Goal: Individualized Educational Video(s) Outcome: Not Progressing   Problem: Coping: Goal: Ability to adjust to condition or change in health will improve Outcome: Not Progressing   Problem: Fluid Volume: Goal: Ability to maintain a balanced intake and output will improve Outcome: Not Progressing   Problem: Health Behavior/Discharge Planning: Goal: Ability to identify and utilize available resources and services will improve Outcome: Not Progressing Goal: Ability to manage health-related needs will improve Outcome: Not Progressing   Problem: Metabolic: Goal: Ability to maintain appropriate glucose levels will improve Outcome: Not Progressing   Problem: Nutritional: Goal: Maintenance of adequate nutrition will improve Outcome: Not Progressing Goal: Progress toward achieving an optimal weight will improve Outcome: Not Progressing   Problem: Skin Integrity: Goal: Risk for impaired skin integrity will decrease Outcome: Not Progressing   Problem: Tissue Perfusion: Goal: Adequacy of tissue perfusion will improve Outcome: Not Progressing   Problem: Education: Goal: Knowledge of General Education information will improve Description: Including pain rating scale, medication(s)/side effects and non-pharmacologic comfort measures Outcome: Not  Progressing   Problem: Health Behavior/Discharge Planning: Goal: Ability to manage health-related needs will improve Outcome: Not Progressing   Problem: Clinical Measurements: Goal: Ability to maintain clinical measurements within normal limits will improve Outcome: Not Progressing Goal: Will remain free from infection Outcome: Not Progressing Goal: Diagnostic test results will improve Outcome: Not Progressing Goal: Respiratory complications will improve Outcome: Not Progressing Goal: Cardiovascular complication will be avoided Outcome: Not Progressing   Problem: Activity: Goal: Risk for activity intolerance will decrease Outcome: Not Progressing   Problem: Nutrition: Goal: Adequate nutrition will be maintained Outcome: Not Progressing   Problem: Coping: Goal: Level of anxiety will decrease Outcome: Not Progressing   Problem: Elimination: Goal: Will not experience complications related to bowel motility Outcome: Not Progressing Goal: Will not experience complications related to urinary retention Outcome: Not Progressing   Problem: Pain Managment: Goal: General experience of comfort will improve Outcome: Not Progressing   Problem: Safety: Goal: Ability to remain free from injury will improve Outcome: Not Progressing   Problem: Skin Integrity: Goal: Risk for impaired skin integrity will decrease Outcome: Not Progressing

## 2023-08-15 NOTE — Progress Notes (Addendum)
  Inpatient Rehabilitation Admissions Coordinator   Met with patient at bedside for rehab assessment. Very emotional and crying at times. Says she has never been like this before. I encouraged her that she is doing well from her COVID recovery. She does verify that she is bipolar and has a Marketing executive that she likes to talk to. Asking me if I think she is crazy. I gave encouragement and reassurance that we are trying to figure out what is making her feel sick now and that she is recovering well from her COVID. She states she has no one to assist her at home. With her current cognitive deficits, she will need 24/7 supervision. It is unlikely that [facility] would approve patient for CIR with current diagnosis, as well as she does not have the 24/7 assistance at home that she needs with her cognitive issues. Other rehab venues should be pursued. Please call me with any questions.   Ottie Glazier, RN, MSN Rehab Admissions Coordinator [phone]    [facility] RN Caryl Ada , Gavin Pound, made aware on 8/30 that we have signed off.  Ottie Glazier, RN, MSN Rehab Admissions Coordinator [phone] 08/16/2023 12:35 PM

## 2023-08-15 NOTE — Telephone Encounter (Signed)
Pharmacy Patient Advocate Encounter  Received notification from [facility] that Prior Authorization for Airsupra 90-80MCG/ACT aerosol  has been APPROVED from 08/02/2023 to 12/16/2023. Ran test claim, Copay is $0.00. This test claim was processed through [facility]- copay amounts may vary at other pharmacies due to pharmacy/plan contracts, or as the patient moves through the different stages of their insurance plan.   PA #/Case ID/Reference #: [id number]

## 2023-08-15 NOTE — Consult Note (Signed)
[facility] Consult Evaluation  Service Date: August 15, 2023 LOS:  LOS: 3 days    Primary Psychiatric Diagnoses  Acute encephalopathy, unclear etiology 2.  Steroid-induced psychosis? 3.  Bipolar 1 disorder  Assessment  [name] is a 62 y.o. female admitted medically on 08/11/2023  9:22 PM for COVID-19 and COPD exacerbation. She carries the psychiatric diagnoses of bipolar 1 disorder and intermittent explosive disorder. She has a past medical history of  COPD, anemia, prediabetes, Seizures, Stroke, Eczema, fibromyalgia, hyperlipidemia, and hypertension. Psychiatry was consulted for altered mental status by Burnadette Pop, MD  on 8/29.   Her current presentation of altered mental status is most consistent with steroid-induced encephalopathy. She meets criteria for bipolar disorder based on past history, patient interview.  Current outpatient psychotropic medications include Abilify 10 mg daily and historically she has had a good response to these medications. She was compliant with medications prior to admission as evidenced by fill history. On initial examination, patient was cooperative, but tearful and fearful of her medical condition and reported "feeling drunk". Please see plan below for detailed recommendations.   Diagnoses:  Active Hospital problems: Principal Problem:   Acute exacerbation of chronic obstructive pulmonary disease (COPD) (HCC) Active Problems:   Headache(784.0)   Peripheral vascular disease (HCC)   Chronic iron deficiency anemia   Bipolar disorder (HCC)   Seizure disorder (HCC)   Hypophosphatemia   Essential hypertension   Cirrhosis (HCC)   COVID-19 virus infection   Hypokalemia   Obesity (BMI 30-39.9)   OSA (obstructive sleep apnea)   Transaminitis     Plan   ## Psychiatric Medication Recommendations:  -- Increase Aripiprazole 10 mg daily to 20 mg daily, starting 8/30, lasting for approximately 2 weeks or until symptoms  subside       -- Supplemental dose 10 mg ordered on 8/29 -- If possible, avoid further steroids during her hospital course.  ## Medical Decision Making Capacity:   Capacity was not formally addressed during this encounter; however, the patient appeared to understand and participate in the discussion about their treatment plan.   ## Further Work-up:  -- Per primary While pt on Qtc prolonging medications, please monitor & replete K+ to 4 and Mg2+ to 2 -- Re-ordered a follow-on EKG for 8/30 (and every few days while on an increased dose of aripiprazole)  -- most recent EKG on 8/25 had QtC of 445 -- Pertinent labwork reviewed earlier this admission includes: CBC, CMP. Notable for hypokalemia  ## Disposition:  -- There are no current psychiatric contraindications to discharge at this time  ## Behavioral / Environmental:  --   No specific recommendations at this time.  or Utilize compassion and acknowledge the patient's experiences while setting clear and realistic expectations for care.  ## Safety and Observation Level:  - Based on my clinical evaluation, I estimate the patient to be at increased unintentional risk of self harm in the current setting - At this time, we recommend a standard level of observation. This decision is based on my review of the chart including patient's history and current presentation, interview of the patient, mental status examination, and consideration of suicide risk including evaluating suicidal ideation, plan, intent, suicidal or self-harm behaviors, risk factors, and protective factors. This judgment is based on our ability to directly address suicide risk, implement suicide prevention strategies and develop a safety plan while the patient is in the clinical setting. Please contact our team if there is a concern that risk  level has changed.  Suicide risk assessment  Patient has following modifiable risk factors for suicide: recklessness, which we are addressing  by modifying her medications, removing steroids, and recommending delirium precautions .   Patient has following non-modifiable or demographic risk factors for suicide: separation or divorce and psychiatric hospitalization  Patient has the following protective factors against suicide: Supportive family and Cultural, spiritual, or religious beliefs that discourage suicide   Thank you for this consult request. Recommendations have been communicated to the primary team.  We will follow at this time.   [name], MD  Psychiatric and Social History   Relevant Aspects of Hospital Course:  Patient is a 62 year old female with history of asthma/COPD with FEV1 of 2023 on Breztri and Dupixent, chronic urticaria, OSA on OSA nightly sleep CPAP, bipolar disorder, obesity, hypertension, seizure disorder, vocal cord paralysis, fibromyalgia and disability who presented with shortness of breath, nonproductive cough.  Admitted on 08/11/2023 for  COVID screening test came out to be positive.  Chest x-ray did not show any pneumonia.  Hospital course remarkable for persistent dyspnea despite being on room air.  She became confused, psychotic since 8/28.  CT head/MRI of the brain negative for acute findings.  Psychiatry consulted today.  Steroid discontinued 8/28.   Patient Report:  8/29: Pt reports feeling sad, scared, and anxious. She is tearful and confused. Alert and oriented to person, place, but had difficulty orienting to day, month, and year. She said that for the last few days she has felt not herself. Feeling scared and like she is "being mean to everyone, but I don't want to be. Please tell my nurses I am sorry I was mean to them." She reported a positive history of bipolar disorder, which has been well-controlled on aripiprazole. Reassured her that we would help her to the best of our abilities.   Psychiatric ROS 8/29 Mood Symptoms Pt states that she is feeling down very sad and scared.  Further ROS not completed due to her tearful state.  Manic Symptoms Pt reports that this is not like her usual manic episodes. She "feels drunk" and not like herself right now.   Anxiety Symptoms No anxiety symptoms before recent days. Current day she is very anxious.  Trauma Symptoms Pt denied trauma symptoms.  Psychosis Symptoms  hallucinations (Patient has been seeing, hearing, and talking with her dead parents); disorganized thinking (inferred from disorganized speech); grossly disorganized or abnormal motor behavior (disordered movements, arm flailing)   Collateral information: With patient permission, called: Contacted Ryan/Sierra Ledon Snare (daughter - [phone]) at 48 on 8/29 - No answer, no voicemail box set up. Contacted Lurena Joiner  (sister- [phone]) at 27 on 8/29 - No answer, no voicemail box set up. Contacted Rayshawna Altes (son- [phone] & [phone]) at [phone] on 8/29 - No answer, no voicemail box set up. Contacted Barnie Alderman (daughter - [phone]) at 63 on 8/29 - No answer, no voicemail box set up.  Got call back from Ermalinda Memos, at 15:38. She has been medically unstable for the last year or so. Frequent hospitalizations. Says patient is normally self-sufficient and lives on her own. She has a history of "some kind of mood problem", but is unaware of a psych diagnosis (did not share). Talks to his mother every other day, she sounded okay to him two days ago, but she was upset about having to stay in the hospital longer to manage her COPD/COVID. He reported her sister might be a good resource.  Psychiatric History:  Information collected from chart review  Prev Dx/Sx: Bipolar 1 Disorder Current Psych Provider: Kendrick Fries NP Current Meds: aripiprazole 10 mg daily Previous Med Trials: citalopram, fluoxetine, hydroxyzine, doxepin, lorazepam, midazolam, quetiapine, suvorexant, tizandine, trazodone, varenicline, venlafaxine,  ambien, cyclobenzaprine, carbemazepine Therapy: Pt reports therapy with good effect.  Prior ECT: denies Prior Psych Hospitalization: Reports yes, cannot remember when or where  Prior Self Harm: Did not assess at this time. Prior Violence: Did not assess at this time.  Family Psych History: Pt denies, son denies Family Hx suicide: Son denies  Social History:  Developmental Hx: Grew up in [place] Educational Hx: Did not assess at this time. 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 Living Situation: Lives alone. Confirmed. Spiritual Hx: Did not assess at this time. Access to weapons: Will assess prior to D/C  Tobacco use: Former smoker Alcohol use: Did not assess at this time. Drug use: Did not assess at this time.   Exam Findings  Psychiatric Specialty Exam:  Presentation  General Appearance: Appropriate for Environment; Casual  Eye Contact:Good  Speech:Blocked; Clear and Coherent  Speech Volume:Increased  Handedness:No data recorded  Mood and Affect  Mood:Labile; Anxious; Depressed  Affect:Tearful; Labile; Congruent   Thought Process  Thought Processes:Disorganized  Descriptions of Associations:Tangential  Orientation:Partial  Thought Content:Scattered  History of Schizophrenia/Schizoaffective disorder:No data recorded Duration of Psychotic Symptoms:No data recorded Hallucinations:Hallucinations: Auditory; Visual Description of Auditory Hallucinations: Reports seeing and talking to her dead parents. They were telling her "everything is going to be okay." Pt says this is NOT normal for her. Description of Visual Hallucinations: Reports seeing and talking to her dead parents. They were telling her "everything is going to be okay." Pt says this is NOT normal for her.  Ideas of Reference:No data recorded Suicidal Thoughts:Suicidal Thoughts: No  Homicidal Thoughts:Homicidal Thoughts: No   Sensorium   Memory:Immediate Poor; Recent Poor; Remote Poor  Judgment:Impaired  Insight:Good (Understands that something is wrong.)   Executive Functions  Concentration:Poor  Attention Span:Fair  Recall:Poor  [employer] of Knowledge:Fair  Language:Fair   Psychomotor Activity  Psychomotor Activity:Psychomotor Activity: Normal  Assets  Assets:Social Support; Housing; Desire for Improvement  Sleep  Sleep:No data recorded   Physical Exam: Vital signs:  Temp:  [98.2 F (36.8 C)-98.8 F (37.1 C)] 98.2 F (36.8 C) (08/29 1128) Pulse Rate:  [86-99] 97 (08/29 1128) Resp:  [17-19] 17 (08/29 1128) BP: (121-149)/(59-88) 149/88 (08/29 1128) SpO2:  [90 %-95 %] 92 % (08/29 1128) FiO2 (%):  [21 %] 21 % (08/28 2140) Weight:  [83.5 kg] 83.5 kg (08/29 0545) Physical Exam Vitals and nursing note reviewed.  Constitutional:      Appearance: She is well-developed. She is ill-appearing.  HENT:     Head: Normocephalic and atraumatic.  Pulmonary:     Effort: Pulmonary effort is normal.  Neurological:     Mental Status: She is disoriented.  Psychiatric:        Attention and Perception: She perceives auditory and visual hallucinations.        Mood and Affect: Affect is labile and tearful.        Speech: Speech is tangential.        Behavior: Behavior is cooperative.        Thought Content: Thought content is delusional. Thought content is not paranoid. Thought content does not include homicidal or suicidal plan.        Cognition and Memory: Cognition is impaired.  Judgment: Judgment is impulsive.     Blood pressure (!) 149/88, pulse 97, temperature 98.2 F (36.8 C), temperature source Oral, resp. rate 17, height 5\' 3"  (1.6 m), weight 83.5 kg, SpO2 92%. Body mass index is 32.61 kg/m.   Other History   These have been pulled in through the EMR, reviewed, and updated if appropriate.   Family History:   The patient's family history includes Alcohol abuse in her brother and father;  Anemia in her daughter, sister, and sister; Anesthesia problems in her father; Asthma in her brother, mother, and sister; Brain cancer in her sister; Breast cancer (age of onset: 8) in her sister; COPD in her mother and sister; Colon cancer (age of onset: 43) in her father; Diabetes in her brother, father, mother, sister, sister, sister, and sister; Heart disease in her brother, brother, mother, and sister; Hypertension in her brother, daughter, sister, sister, sister, sister, and sister; Kidney disease in her father; Sleep apnea in her brother.  Medical History: Past Medical History:  Diagnosis Date   Anemia, iron deficiency 12/22/2014   pt. 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      Bipolar disorder (HCC)    with anxiety, depression   COPD (chronic obstructive pulmonary disease) (HCC)    Eczema 05/18/2022   Fibromyalgia 05/14/2014   GERD (gastroesophageal reflux disease)    History of kidney stones    Hyperlipidemia 04/20/2017   HYPERTENSION 05/12/2009   Qualifier: Diagnosis of  By: Truman Hayward Duncan Dull), [name]     Peripheral vascular disease (HCC)    Pneumonia    Prediabetes 02/23/2014   pt. denies   Seizure (HCC)    Stroke (HCC) 11/2020   Urticaria     Surgical History: Past Surgical History:  Procedure Laterality Date   ABDOMINAL HYSTERECTOMY     ANTERIOR CERVICAL DECOMP/DISCECTOMY FUSION N/A 07/28/2020   Procedure: ANTERIOR CERVICAL DECOMPRESSION/DISCECTOMY FUSION. INTERBODY PROTHESIS, PLATE/SCREWS CERVICAL THREE- CERVICAL FOUR, CERVICAL FOUR- CERVICAL FIVE;  Surgeon: Tressie Stalker, MD;  Location: [facility] OR;  Service: Neurosurgery;  Laterality: N/A;   BACK SURGERY     COLONOSCOPY  12/20/2011   Procedure: COLONOSCOPY;  Surgeon: Freddy Jaksch, MD;  Location: [facility];  Service:  Endoscopy;  Laterality: N/A;   COLONOSCOPY  03/05/2012   Procedure: COLONOSCOPY;  Surgeon: Freddy Jaksch, MD;  Location: [facility];  Service: Endoscopy;  Laterality: N/A;   DIAGNOSTIC LAPAROSCOPY     HEMORRHOID SURGERY     INCISE AND DRAIN ABCESS     KIDNEY STONE SURGERY     NECK SURGERY     x 2 Dr Terrilee Files   SPINE SURGERY  2019   TOE SURGERY     TUBAL LIGATION      Medications:   Current Facility-Administered Medications:    acetaminophen (TYLENOL) tablet 650 mg, 650 mg, Oral, Q6H PRN **OR** acetaminophen (TYLENOL) suppository 650 mg, 650 mg, Rectal, Q6H PRN, Howerter, Justin B, DO   albuterol (PROVENTIL) (2.5 MG/3ML) 0.083% nebulizer solution 2.5 mg, 2.5 mg, Nebulization, Q4H PRN, Howerter, Justin B, DO, 2.5 mg at 08/13/23 0500   ARIPiprazole (ABILIFY) tablet 10 mg, 10 mg, Oral, Once, Xiamara Hulet, Byrd Hesselbach, MD   [START ON 08/16/2023] ARIPiprazole (ABILIFY) tablet 20 mg, 20 mg, Oral, Daily, [name], MD   carbamazepine (TEGRETOL) tablet  400 mg, 400 mg, Oral, Daily, 400 mg at 08/15/23 0805 **AND** carbamazepine (TEGRETOL) tablet 600 mg, 600 mg, Oral, QHS, Howerter, Justin B, DO, 600 mg at 08/14/23 2221   enoxaparin (LOVENOX) injection 40 mg, 40 mg, Subcutaneous, Daily, Danford, Earl Lites, MD, 40 mg at 08/15/23 0804   famotidine (PEPCID) tablet 40 mg, 40 mg, Oral, QHS, Howerter, Justin B, DO, 40 mg at 08/14/23 2222   ferrous sulfate tablet 325 mg, 325 mg, Oral, Q breakfast, Howerter, Justin B, DO, 325 mg at 08/15/23 0803   fluticasone (FLONASE) 50 MCG/ACT nasal spray 1 spray, 1 spray, Each Nare, BID PRN, Danford, Earl Lites, MD   hydrochlorothiazide (HYDRODIURIL) tablet 25 mg, 25 mg, Oral, Daily, Adhikari, Amrit, MD, 25 mg at 08/15/23 0803   insulin aspart (novoLOG) injection 0-9 Units, 0-9 Units, Subcutaneous, TID WC, Howerter, Justin B, DO, 1 Units at 08/15/23 0730   ipratropium-albuterol (DUONEB) 0.5-2.5 (3) MG/3ML nebulizer solution 3 mL, 3  mL, Nebulization, BID, Danford, Earl Lites, MD, 3 mL at 08/15/23 0849   irbesartan (AVAPRO) tablet 150 mg, 150 mg, Oral, Daily, Adhikari, Amrit, MD, 150 mg at 08/15/23 5284   labetalol (NORMODYNE) injection 10 mg, 10 mg, Intravenous, Q2H PRN, Adhikari, Amrit, MD   lactulose (CHRONULAC) 10 GM/15ML solution 10 g, 10 g, Oral, TID, Adhikari, Amrit, MD   loratadine (CLARITIN) tablet 10 mg, 10 mg, Oral, Daily, Danford, Christopher P, MD, 10 mg at 08/15/23 0804   mometasone-formoterol (DULERA) 200-5 MCG/ACT inhaler 2 puff, 2 puff, Inhalation, BID, Danford, Earl Lites, MD, 2 puff at 08/15/23 0849   montelukast (SINGULAIR) tablet 10 mg, 10 mg, Oral, QHS, Howerter, Justin B, DO, 10 mg at 08/14/23 2221   ondansetron (ZOFRAN) injection 4 mg, 4 mg, Intravenous, Q6H PRN, Howerter, Justin B, DO, 4 mg at 08/15/23 0808   Oral care mouth rinse, 15 mL, Mouth Rinse, PRN, Danford, Earl Lites, MD   pantoprazole (PROTONIX) EC tablet 40 mg, 40 mg, Oral, BID, Howerter, Justin B, DO, 40 mg at 08/15/23 0804   polyethylene glycol (MIRALAX / GLYCOLAX) packet 17 g, 17 g, Oral, Daily, Adhikari, Amrit, MD, 17 g at 08/15/23 1329   senna (SENOKOT) tablet 8.6 mg, 1 tablet, Oral, BID, Adhikari, Amrit, MD, 8.6 mg at 08/15/23 1329  Allergies: Allergies  Allergen Reactions   Sulfa Antibiotics Nausea And Vomiting and Other (See Comments)   Venofer [Iron Sucrose] Hives

## 2023-08-16 DIAGNOSIS — T380X5A Adverse effect of glucocorticoids and synthetic analogues, initial encounter: Secondary | ICD-10-CM | POA: Diagnosis not present

## 2023-08-16 DIAGNOSIS — F060 Psychotic disorder with hallucinations due to known physiological condition: Secondary | ICD-10-CM | POA: Diagnosis not present

## 2023-08-16 DIAGNOSIS — G9340 Encephalopathy, unspecified: Secondary | ICD-10-CM | POA: Diagnosis not present

## 2023-08-16 DIAGNOSIS — U071 COVID-19: Secondary | ICD-10-CM | POA: Diagnosis not present

## 2023-08-16 DIAGNOSIS — F319 Bipolar disorder, unspecified: Secondary | ICD-10-CM | POA: Diagnosis not present

## 2023-08-16 LAB — BASIC METABOLIC PANEL
Anion gap: 13 (ref 5–15)
BUN: 19 mg/dL (ref 6–20)
CO2: 28 mmol/L (ref 22–32)
Calcium: 8.7 mg/dL — ABNORMAL LOW (ref 8.9–10.3)
Chloride: 94 mmol/L — ABNORMAL LOW (ref 98–111)
Creatinine, Ser: 0.89 mg/dL (ref 0.44–1.00)
GFR, Estimated: >60 mL/min (ref >60)
Glucose, Bld: 162 mg/dL — ABNORMAL HIGH (ref 70–99)
Potassium: 3.8 mmol/L (ref 3.5–5.1)
Sodium: 135 mmol/L (ref 135–145)

## 2023-08-16 LAB — CBC
HCT: 37.5 % (ref 36.0–46.0)
Hemoglobin: 11.7 g/dL — ABNORMAL LOW (ref 12.0–15.0)
MCH: 26.1 pg (ref 26.0–34.0)
MCHC: 31.2 g/dL (ref 30.0–36.0)
MCV: 83.5 fL (ref 80.0–100.0)
Platelets: 514 10*3/uL — ABNORMAL HIGH (ref 150–400)
RBC: 4.49 MIL/uL (ref 3.87–5.11)
RDW: 15.8 % — ABNORMAL HIGH (ref 11.5–15.5)
WBC: 14.8 10*3/uL — ABNORMAL HIGH (ref 4.0–10.5)
nRBC: 0.1 % (ref 0.0–0.2)

## 2023-08-16 LAB — GLUCOSE, CAPILLARY
Glucose-Capillary: 161 mg/dL — ABNORMAL HIGH (ref 70–99)
Glucose-Capillary: 146 mg/dL — ABNORMAL HIGH (ref 70–99)
Glucose-Capillary: 118 mg/dL — ABNORMAL HIGH (ref 70–99)
Glucose-Capillary: 140 mg/dL — ABNORMAL HIGH (ref 70–99)

## 2023-08-16 LAB — AMMONIA: Ammonia: 46 umol/L — ABNORMAL HIGH (ref 9–35)

## 2023-08-16 MED ORDER — LITHIUM CARBONATE 150 MG PO CAPS
150.0000 mg | ORAL_CAPSULE | Freq: Two times a day (BID) | ORAL | Status: DC
  Administered 2023-08-16 – 2023-08-19 (×5): 150 mg via ORAL
  Filled 2023-08-16 (×8): qty 1

## 2023-08-16 MED ORDER — LACTATED RINGERS IV BOLUS
500.0000 mL | Freq: Once | INTRAVENOUS | Status: AC
  Administered 2023-08-16: 500 mL via INTRAVENOUS

## 2023-08-16 MED ORDER — ARIPIPRAZOLE 5 MG PO TABS
10.0000 mg | ORAL_TABLET | Freq: Once | ORAL | Status: AC
  Administered 2023-08-16: 10 mg via ORAL
  Filled 2023-08-16: qty 2

## 2023-08-16 MED ORDER — BISACODYL 10 MG RE SUPP
10.0000 mg | Freq: Once | RECTAL | Status: AC
  Administered 2023-08-16: 10 mg via RECTAL
  Filled 2023-08-16: qty 1

## 2023-08-16 NOTE — Progress Notes (Signed)
Pt telesitter alert for patient to remain in bed and I arrived to the room to observe patient very confused and thrashing about in bed completely naked She had a very blank stare and  unable to verbalize  O2 levels assessed while patient was on roomair Spo2 92-95%  Low bp levels in the right arm, lowest at 55/30 rechecked 61/48 Left arm reading 82/62, rechecked once patient got calmer 100/72 Pt was unable to verbalize, but followed simple commands She then became flaccid and only mumbled responses Rapid response was called and MD Gardener was notified  The patient was more responsive with the rapid nurse and verbalized small amount Rapid nurse placed the patient on CPAP at bedside MD ordered LR Bolus and changed bp meds  I will continue to monitor the patient for changes in status  Telesitter still in use Bed in proper position for safety Patient is sleeping  Bed alarm in use Will continue to monitor for changes in status

## 2023-08-16 NOTE — Progress Notes (Signed)
PROGRESS NOTE  Chelse Gunner  BJY:[id number] DOB: 03-Apr-1961 DOA: 08/11/2023 PCP: Corwin Levins, MD   Brief Narrative: Patient is a 62 year old female with history of asthma/COPD with FEV1 of 2023 on Breztri and Dupixent, chronic urticaria, OSA on OSA nightly sleep CPAP, bipolar disorder, obesity, hypertension, seizure disorder, vocal cord paralysis, fibromyalgia and disability who presented with shortness of breath, nonproductive cough.  On presentation, COVID screening test came out to be positive.  Chest x-ray did not show any pneumonia.  Hospital course remarkable for persistent dyspnea despite being on room air.  She became confused, psychotic since 8/28.  CT head/MRI of the brain negative for acute findings.  Psychiatry consulted and following.  Adjusting medications  Assessment & Plan:  Principal Problem:   COVID-19 virus infection Active Problems:   Acute exacerbation of chronic obstructive pulmonary disease (COPD) (HCC)   Headache(784.0)   Peripheral vascular disease (HCC)   Chronic iron deficiency anemia   Bipolar disorder (HCC)   Seizure disorder (HCC)   Hypophosphatemia   Essential hypertension   Cirrhosis (HCC)   Hypokalemia   Obesity (BMI 30-39.9)   OSA (obstructive sleep apnea)   Transaminitis   Steroid-induced psychosis, with delusions (HCC)  Acute exacerbation of COPD: This is secondary to COVID infection.  Chest x-ray did not show any pneumonia.  Presented with shortness of breath, cough. She was on  Solu-Medrol, azithromycin, bronchodilators, Zyrtec, Flonase, Singulair.  Continue intensive spirometry, flutter valve.  Continue PPI.She  remained on room air.  Steroids discontinued due to concern for psychosis.  No wheezing today  COVID infection: Diagnosed about 5 days ago before admission.  Outside window for Paxlovid.  No infiltrates or hypoxia.  Continue supportive care.  Altered mentation /psychosis/history of bipolar disorder: She was found to be  confused on 8/28.  Rapid response was called.She does not have any focal deficits.  She was complaining of being drowsy/drunk.  She was on a scheduled hydroxyzine which has been discontinued.  CT head was done which did not show any acute findings.  MRI of the brain did not show any acute findings.  Mildly elevated ammonia which may not have contributed to confusion.  Steroid discontinued due to concern for steroid induced psychosis.  Psychiatry consulted and following .increased the dose of aripiprazole, starting lithium.  Tegretol will be to be continued  Leukocytosis: This is most likely reactive secondary to steroids.  Continue to monitor .  Improving  Elevated liver enzymes: In the setting of COVID.  Continue to monitor.  Statin on hold  OSA: Continue CPAP at night.  Obesity: BMI of 32  Hypokalemia/hypophosphatemia: Being monitored and supplemented as needed  Cirrhosis: Well compensated.  No ascites  Hypertension: Takes thiazide, ARB  at home.  Blood pressure was soft overnight and these medical since discontinued  Seizure disorder: On Tegretol.  Chronic iron deficiency  anemia: Current hemoglobin stable.  Peripheral vascular disease: Takes statin at home.  Currently on hold due to elevated LFT  Constipation: Continue bowel regimen         DVT prophylaxis:enoxaparin (LOVENOX) injection 40 mg Start: 08/12/23 1000     Code Status: Full Code  Family Communication: called the daughter Alycia Rossetti on her phone 3 times today, call not received  Patient status:Inpatient  Patient is from :Home  Anticipated discharge YQ:MVHQ  Estimated DC date: After resolution of confusion, psychiatric clearance   Consultants: Psychiatry  Procedures:None  Antimicrobials:  Anti-infectives (From admission, onward)    Start     Dose/Rate  Route Frequency Ordered Stop   08/12/23 0145  azithromycin (ZITHROMAX) 500 mg in sodium chloride 0.9 % 250 mL IVPB  Status:  Discontinued       Note to  Pharmacy: (In setting of acute copd exac)   500 mg 250 mL/hr over 60 Minutes Intravenous Daily at bedtime 08/12/23 0123 08/15/23 1304       Subjective: Patient seen and examined at bedside today.  Not agitated.  Calm and cooperative.  Obeys commands.  Oriented to place only.  She still says that she feels drunk and wants to get out of the hospital.  Mental status might have improved since yesterday  Objective: Vitals:   08/16/23 0455 08/16/23 0515 08/16/23 0745 08/16/23 0800  BP: 100/72   119/79  Pulse: 72 73 74 87  Resp:   20 19  Temp:    98 F (36.7 C)  TempSrc:    Oral  SpO2: 100% 94% 100% 100%  Weight:      Height:        Intake/Output Summary (Last 24 hours) at 08/16/2023 1126 Last data filed at 08/15/2023 1700 Gross per 24 hour  Intake 120 ml  Output --  Net 120 ml   Filed Weights   08/12/23 0336 08/14/23 0636 08/15/23 0545  Weight: 82.1 kg 81 kg 83.5 kg    Examination:  General exam: Overall comfortable, not in distress HEENT: PERRL Respiratory system:  no wheezes or crackles, diminished sounds bilaterally Cardiovascular system: S1 & S2 heard, RRR.  Gastrointestinal system: Abdomen is nondistended, soft and nontender. Central nervous system: Alert and awake, follows commands, oriented to place.  Easily distractible Extremities: No edema, no clubbing ,no cyanosis Skin: No rashes, no ulcers,no icterus      Data Reviewed: I have personally reviewed following labs and imaging studies  CBC: Recent Labs  Lab 08/11/23 2130 08/11/23 2242 08/12/23 0231 08/12/23 0239 08/13/23 0552 08/14/23 0411 08/15/23 0345 08/16/23 0625  WBC 13.1*  --  11.8*  --  20.0* 19.5* 17.8* 14.8*  NEUTROABS 11.0*  --  10.2*  --   --   --   --   --   HGB 10.2*   < > 9.3* 9.2* 9.8* 9.3* 11.3* 11.7*  HCT 33.5*   < > 29.6* 27.0* 30.6* 29.8* 36.3 37.5  MCV 85.2  --  86.5  --  84.8 83.0 84.0 83.5  PLT 512*  --  427*  --  495* 450* 484* 514*   < > = values in this interval not  displayed.   Basic Metabolic Panel: Recent Labs  Lab 08/12/23 0231 08/12/23 0239 08/13/23 0545 08/13/23 0552 08/14/23 0411 08/15/23 0345 08/16/23 0625  NA 136 137  --  138 135 136 135  K 3.1* 4.2  --  3.0* 3.2* 3.0* 3.8  CL 99  --   --  98 93* 91* 94*  CO2 25  --   --  29 33* 33* 28  GLUCOSE 276*  --   --  228* 162* 153* 162*  BUN 14  --   --  13 17 13 19   CREATININE 0.90  --   --  0.84 0.81 0.86 0.89  CALCIUM 8.5*  --   --  9.1 8.5* 8.6* 8.7*  MG 2.4  --   --   --   --   --   --   PHOS 1.9*  --  3.0  --  3.5  --   --      Recent  Results (from the past 240 hour(s))  SARS Coronavirus 2 by RT PCR (hospital order, performed in [facility] hospital lab) *cepheid single result test* Anterior Nasal Swab     Status: Abnormal   Collection Time: 08/11/23  9:42 AM   Specimen: Anterior Nasal Swab  Result Value Ref Range Status   SARS Coronavirus 2 by RT PCR POSITIVE (A) NEGATIVE Final    Comment: Performed at [facility], [address] [place], [place] [address]  Resp panel by RT-PCR (RSV, Flu A&B, Covid) Anterior Nasal Swab     Status: Abnormal   Collection Time: 08/11/23  9:43 AM   Specimen: Anterior Nasal Swab  Result Value Ref Range Status   SARS Coronavirus 2 by RT PCR POSITIVE (A) NEGATIVE Final   Influenza A by PCR NEGATIVE NEGATIVE Final   Influenza B by PCR NEGATIVE NEGATIVE Final    Comment: (NOTE) The Xpert Xpress SARS-CoV-2/FLU/RSV plus assay is intended as an aid in the diagnosis of influenza from Nasopharyngeal swab specimens and should not be used as a sole basis for treatment. Nasal washings and aspirates are unacceptable for Xpert Xpress SARS-CoV-2/FLU/RSV testing.  Fact Sheet for Patients: BloggerCourse.com  Fact Sheet for Healthcare Providers: SeriousBroker.it  This test is not yet approved or cleared by the [place] FDA and has been authorized for detection and/or diagnosis of SARS-CoV-2  by FDA under an Emergency Use Authorization (EUA). This EUA will remain in effect (meaning this test can be used) for the duration of the COVID-19 declaration under Section 564(b)(1) of the Act, 21 U.S.C. section 360bbb-3(b)(1), unless the authorization is terminated or revoked.     Resp Syncytial Virus by PCR NEGATIVE NEGATIVE Final    Comment: (NOTE) Fact Sheet for Patients: BloggerCourse.com  Fact Sheet for Healthcare Providers: SeriousBroker.it  This test is not yet approved or cleared by the [place] FDA and has been authorized for detection and/or diagnosis of SARS-CoV-2 by FDA under an Emergency Use Authorization (EUA). This EUA will remain in effect (meaning this test can be used) for the duration of the COVID-19 declaration under Section 564(b)(1) of the Act, 21 U.S.C. section 360bbb-3(b)(1), unless the authorization is terminated or revoked.  Performed at [facility], [address] [place], [place] [address]   Culture, blood (Routine X 2) w Reflex to ID Panel     Status: None (Preliminary result)   Collection Time: 08/12/23  1:58 AM   Specimen: BLOOD  Result Value Ref Range Status   Specimen Description BLOOD SITE NOT SPECIFIED  Final   Special Requests   Final    BOTTLES DRAWN AEROBIC AND ANAEROBIC Blood Culture adequate volume   Culture   Final    NO GROWTH 4 DAYS Performed at [facility], [address], [place], [place] [address]    Report Status PENDING  Incomplete  Culture, blood (Routine X 2) w Reflex to ID Panel     Status: None (Preliminary result)   Collection Time: 08/12/23  4:57 AM   Specimen: BLOOD  Result Value Ref Range Status   Specimen Description BLOOD BLOOD RIGHT ARM  Final   Special Requests   Final    BOTTLES DRAWN AEROBIC AND ANAEROBIC Blood Culture adequate volume   Culture   Final    NO GROWTH 4 DAYS Performed at [facility], [address], [place],  [place] [address]    Report Status PENDING  Incomplete     Radiology Studies: DG  Abd 1 View  Result Date: 08/14/2023 CLINICAL DATA:  Confusion EXAM: ABDOMEN - 1 VIEW COMPARISON:  05/17/2016 FINDINGS: Supine frontal views of the abdomen and pelvis are obtained. Bowel gas pattern is unremarkable without obstruction or ileus. Moderate retained stool throughout the colon. No masses or abnormal calcifications. Lung bases are clear. No acute bony abnormalities. IMPRESSION: 1. No bowel obstruction or ileus. 2. Moderate retained stool throughout the colon. Electronically Signed   By: Sharlet Salina M.D.   On: 08/14/2023 16:25   MR BRAIN WO CONTRAST  Result Date: 08/14/2023 CLINICAL DATA:  Delirium.  Altered mental status. EXAM: MRI HEAD WITHOUT CONTRAST TECHNIQUE: Multiplanar, multiecho pulse sequences of the brain and surrounding structures were obtained without intravenous contrast. COMPARISON:  MR head without contrast 12/02/2020 FINDINGS: Brain: The study is somewhat degraded by patient motion. Periventricular and subcortical T2 hyperintensities are similar the prior exam. No acute infarct, hemorrhage, or mass lesion is present. The brainstem and cerebellum are within normal limits. The internal auditory canals are within normal limits. Midline structures are within normal limits. Vascular: Flow is present in the major intracranial arteries. Skull and upper cervical spine: The T1 sagittal images are moderately degraded by patient motion. Craniocervical junction is grossly within normal limits. Sinuses/Orbits: The paranasal sinuses and mastoid air cells are clear. The globes and orbits are within normal limits. IMPRESSION: 1. No acute intracranial abnormality or significant interval change. 2. Periventricular and subcortical T2 hyperintensities bilaterally are stable. The finding is nonspecific but can be seen in the setting of chronic microvascular ischemia, a demyelinating process such as multiple sclerosis,  vasculitis, complicated migraine headaches, or as the sequelae of a prior infectious or inflammatory process. Electronically Signed   By: Marin Roberts M.D.   On: 08/14/2023 15:53    Scheduled Meds:  ARIPiprazole  10 mg Oral Once   ARIPiprazole  20 mg Oral Daily   bisacodyl  10 mg Rectal Once   carbamazepine  400 mg Oral Daily   And   carbamazepine  600 mg Oral QHS   enoxaparin (LOVENOX) injection  40 mg Subcutaneous Daily   famotidine  40 mg Oral QHS   ferrous sulfate  325 mg Oral Q breakfast   insulin aspart  0-9 Units Subcutaneous TID WC   ipratropium-albuterol  3 mL Nebulization BID   lactulose  10 g Oral TID   lithium carbonate  150 mg Oral BID WC   loratadine  10 mg Oral Daily   mometasone-formoterol  2 puff Inhalation BID   montelukast  10 mg Oral QHS   pantoprazole  40 mg Oral BID   polyethylene glycol  17 g Oral Daily   senna  1 tablet Oral BID   Continuous Infusions:     LOS: 4 days   Burnadette Pop, MD [facility] P8/30/2024, 11:26 AM

## 2023-08-16 NOTE — Progress Notes (Addendum)
I was notified because pt was more lethargic than at beginning of shift. Pt was selective in her interaction but was able to answer some simple questions and she stated she was scared. She said "I feel drunk." MAE x4 equally. Pt wears CPAP at bedtime and has not been on it tonight reportedly. Pt refused CPAP earlier but has agreed to wear it now. Pt placed on CPAP at her previous settings.

## 2023-08-16 NOTE — Progress Notes (Addendum)
Physical Therapy Treatment Patient Details Name: Jaxon Gilles MRN: [id number] DOB: 02-Jan-1961 Today's Date: 08/16/2023   History of Present Illness Mrs. Kokoszka is a 62 y.o. F admitted 8/25 presenting with COPD flare after failed outpatient management.  COVID positive.  Elevated BP 8/28 with new onset symptoms causing imbalance and incr WOB.  Nurse called to room during therapy session.  PMH: asthma/COPD FEV1 28% in 2023 on Breztri and Dupixent, chronic urticaria, OSA on nightly BiPAP, bipolar d/o, obesity, HTN, possible seizures, vocal cord dysfunction and fibromyalgia on disability    PT Comments  Pt admitted with above diagnosis. Pt was able to stand with +2 mod assist for safety and after trying to take a few side steps  her knees buckled and she needed incr assist.  Pt continues to have incr confusion and poor motor planning and processing. Also suspect vertigo however cannot fully assess as pt is unable to follow instructions to complete assessment. Pt not progressing due to her cognitive/mobility issues. MD adjusting medications and will reassess next week to determine best d/c plan once medications are adjusted. Pt currently with functional limitations due to the deficits listed below (see PT Problem List). Pt will benefit from acute skilled PT to increase their independence and safety with mobility to allow discharge.       If plan is discharge home, recommend the following: A lot of help with walking and/or transfers;A little help with bathing/dressing/bathroom;Assistance with cooking/housework;Assist for transportation;Help with stairs or ramp for entrance   Can travel by private vehicle        Equipment Recommendations  None recommended by PT    Recommendations for Other Services Rehab consult     Precautions / Restrictions Precautions Precautions: Fall Precaution Comments: COVID Restrictions Weight Bearing Restrictions: No     Mobility  Bed Mobility Overal  bed mobility: Needs Assistance Bed Mobility: Rolling, Sidelying to Sit Rolling: Min assist Sidelying to sit: Min assist       General bed mobility comments: Pt first attempt to sit up, she laid back down quickly and said she was "drunk".  Had pt look at target and keep eyes on it and she was able to come to EOB with constant cues.  As long as eyes on target she sat EOB. Suspect possible vertigo however could not get pt to follow commands to complete full vestibular assessment.    Transfers Overall transfer level: Needs assistance Equipment used: 2 person hand held assist Transfers: Sit to/from Stand, Bed to chair/wheelchair/BSC Sit to Stand: Mod assist, +2 physical assistance, From elevated surface           General transfer comment: Pt able to stand to feet with +2 HHA with mod assist and cues for hand placement.  Pt impulsive with movements and had trouble following commands with poor motor planning.  Asked pt to take a few side steps to [facility] and pt took 2 steps and then proceeded to buckle at knees and PT and tech had to assist pt onto bed abruptly.  Pt appears totally unaware of her safety issues. Assisted her back to bed with bed alarm on.    Ambulation/Gait                   Stairs             Wheelchair Mobility     Tilt Bed    Modified Rankin (Stroke Patients Only)       Balance Overall balance assessment: Needs assistance  Sitting-balance support: Single extremity supported, Bilateral upper extremity supported, No upper extremity supported, Feet supported Sitting balance-Leahy Scale: Poor Sitting balance - Comments: Pt had 1 significant LOB posteriorly needing min assist for sitting Postural control: Posterior lean Standing balance support: Bilateral upper extremity supported, During functional activity Standing balance-Leahy Scale: Poor Standing balance comment: Pt needing +2 external support for static stance                             Cognition Arousal: Alert Behavior During Therapy: WFL for tasks assessed/performed Overall Cognitive Status: Impaired/Different from baseline Area of Impairment: Safety/judgement, Problem solving, Following commands                       Following Commands: Follows one step commands with increased time Safety/Judgement: Decreased awareness of safety, Decreased awareness of deficits   Problem Solving: Difficulty sequencing, Requires verbal cues, Decreased initiation, Slow processing General Comments: Pt somewhat distraught and crying at times.  Pt stated she was "drunk" and she was upset about this. Tried to help pt relax and encouraged her to not worry.  Pt aware of place and situation but not time.        Exercises      General Comments        Pertinent Vitals/Pain Pain Assessment Pain Assessment: No/denies pain    Home Living                          Prior Function            PT Goals (current goals can now be found in the care plan section) Acute Rehab PT Goals Patient Stated Goal: to feel better Progress towards PT goals: Progressing toward goals    Frequency    Min 1X/week      PT Plan      Co-evaluation              AM-PAC PT "6 Clicks" Mobility   Outcome Measure  Help needed turning from your back to your side while in a flat bed without using bedrails?: A Little Help needed moving from lying on your back to sitting on the side of a flat bed without using bedrails?: A Little Help needed moving to and from a bed to a chair (including a wheelchair)?: Total Help needed standing up from a chair using your arms (e.g., wheelchair or bedside chair)?: Total Help needed to walk in hospital room?: Total Help needed climbing 3-5 steps with a railing? : Total 6 Click Score: 10    End of Session Equipment Utilized During Treatment: Gait belt Activity Tolerance: Patient limited by fatigue (motor processing issues) Patient left: in  bed;with call bell/phone within reach;with bed alarm set Nurse Communication: Mobility status PT Visit Diagnosis: Unsteadiness on feet (R26.81);History of falling (Z91.81);Dizziness and giddiness (R42)     Time: 3244-0102 PT Time Calculation (min) (ACUTE ONLY): 22 min  Charges:    $Therapeutic Activity: 8-22 mins PT General Charges $$ ACUTE PT VISIT: 1 Visit                     Amea Mcphail M,PT Acute Rehab Services [phone]    Bevelyn Buckles 08/16/2023, 2:16 PM

## 2023-08-16 NOTE — Significant Event (Signed)
[facility] night coverage note: Called by RN: Pt with episode of acute confusion / psychosis, pt initially confused, thrashing around per RN.  Now a bit less confused but still not back to baseline. Rapid response evaluated, patient put on CPAP by rapid response..   Patient doesn't have any focal neurologic deficits on exam.   Presumably this either delirium vs psychosis given recent events, suspected steroid-induced encephalopathy, etc.  Events sound very similar to acute confusion episode of early AM 2 days ago.  Only glaring difference as far as I can tell is that she was reportedly HYPOtensive this time instead of HYPERtensive.  Though admittedly its unclear if BP readings while she was "thrashing around" were accurate.  BP after she calms down some is 100 systolic.  No focal neuro deficits + neg CT head and MRI brain just 2 days ago after very similar sounding episode + high suspicion of steroid psychosis by psychiatry (see notes), all suggest that repeating CNS imaging would be low yield at this time.  Given report of HYPOtension this AM: giving 500cc bolus, will DC her BP meds for the moment given report of soft BPs (currently 100 systolic). CBC, BMP pending this AM Will repeat ammonia level

## 2023-08-16 NOTE — Plan of Care (Signed)
  Problem: Education: Goal: Knowledge of risk factors and measures for prevention of condition will improve Outcome: Not Progressing   Problem: Coping: Goal: Psychosocial and spiritual needs will be supported Outcome: Not Progressing   Problem: Respiratory: Goal: Will maintain a patent airway Outcome: Not Progressing Goal: Complications related to the disease process, condition or treatment will be avoided or minimized Outcome: Not Progressing   Problem: Education: Goal: Ability to describe self-care measures that may prevent or decrease complications (Diabetes Survival Skills Education) will improve Outcome: Not Progressing Goal: Individualized Educational Video(s) Outcome: Not Progressing   Problem: Coping: Goal: Ability to adjust to condition or change in health will improve Outcome: Not Progressing   Problem: Fluid Volume: Goal: Ability to maintain a balanced intake and output will improve Outcome: Not Progressing   Problem: Health Behavior/Discharge Planning: Goal: Ability to identify and utilize available resources and services will improve Outcome: Not Progressing Goal: Ability to manage health-related needs will improve Outcome: Not Progressing   Problem: Metabolic: Goal: Ability to maintain appropriate glucose levels will improve Outcome: Not Progressing   Problem: Nutritional: Goal: Maintenance of adequate nutrition will improve Outcome: Not Progressing Goal: Progress toward achieving an optimal weight will improve Outcome: Not Progressing   Problem: Skin Integrity: Goal: Risk for impaired skin integrity will decrease Outcome: Not Progressing   Problem: Tissue Perfusion: Goal: Adequacy of tissue perfusion will improve Outcome: Not Progressing   Problem: Education: Goal: Knowledge of General Education information will improve Description: Including pain rating scale, medication(s)/side effects and non-pharmacologic comfort measures Outcome: Not  Progressing   Problem: Health Behavior/Discharge Planning: Goal: Ability to manage health-related needs will improve Outcome: Not Progressing   Problem: Clinical Measurements: Goal: Ability to maintain clinical measurements within normal limits will improve Outcome: Not Progressing Goal: Will remain free from infection Outcome: Not Progressing Goal: Diagnostic test results will improve Outcome: Not Progressing Goal: Respiratory complications will improve Outcome: Not Progressing Goal: Cardiovascular complication will be avoided Outcome: Not Progressing   Problem: Activity: Goal: Risk for activity intolerance will decrease Outcome: Not Progressing   Problem: Nutrition: Goal: Adequate nutrition will be maintained Outcome: Not Progressing   Problem: Coping: Goal: Level of anxiety will decrease Outcome: Not Progressing   Problem: Elimination: Goal: Will not experience complications related to bowel motility Outcome: Not Progressing Goal: Will not experience complications related to urinary retention Outcome: Not Progressing   Problem: Pain Managment: Goal: General experience of comfort will improve Outcome: Not Progressing   Problem: Safety: Goal: Ability to remain free from injury will improve Outcome: Not Progressing   Problem: Skin Integrity: Goal: Risk for impaired skin integrity will decrease Outcome: Not Progressing

## 2023-08-16 NOTE — Consult Note (Addendum)
[facility] Consult Evaluation  Service Date: August 16, 2023 LOS:  LOS: 4 days    Primary Psychiatric Diagnoses  Acute encephalopathy, unclear etiology 2.  Steroid-induced psychosis? 3.  Bipolar 1 disorder  Assessment  [name] is a 62 y.o. female admitted medically on 08/11/2023  9:22 PM for COVID-19 and COPD exacerbation. She carries the psychiatric diagnoses of bipolar 1 disorder and intermittent explosive disorder. She has a past medical history of  COPD, anemia, prediabetes, Seizures, Stroke, Eczema, fibromyalgia, hyperlipidemia, and hypertension. Psychiatry was consulted for altered mental status by Burnadette Pop, MD  on 8/29.   Her current presentation of altered mental status is most consistent with steroid-induced psychosis. She meets criteria for bipolar disorder based on past history, patient interview.  Current outpatient psychotropic medications include Abilify 10 mg daily and historically she has had a good response to these medications. She was compliant with medications prior to admission as evidenced by fill history. On initial examination, patient was cooperative, but tearful and fearful of her medical condition and reported "feeling drunk".   Increasing antipsychotic and adding on lithium to assist with mood stability and COVID-19.   Please see plan below for detailed recommendations.   Diagnoses:  Active Hospital problems: Principal Problem:   COVID-19 virus infection Active Problems:   Headache(784.0)   Peripheral vascular disease (HCC)   Chronic iron deficiency anemia   Bipolar disorder (HCC)   Seizure disorder (HCC)   Hypophosphatemia   Essential hypertension   Cirrhosis (HCC)   Acute exacerbation of chronic obstructive pulmonary disease (COPD) (HCC)   Hypokalemia   Obesity (BMI 30-39.9)   OSA (obstructive sleep apnea)   Transaminitis   Steroid-induced psychosis, with delusions (HCC)    Plan   ## Psychiatric Medication  Recommendations:  -- Increase Aripiprazole 10 mg daily to 30 mg daily, starting 8/31, lasting for approximately 2 weeks or until symptoms subside       -- Supplemental dose 10 mg ordered on 8/30 at 11 am -- If possible, avoid further steroids during her hospital course. -- Start Lithium 150 mg Q12       --Lithium has been shown to assist with mood stabilization AND improve clinical course of patients with COVID-19: [email or web address]  ## Medical Decision Making Capacity:   Capacity was not formally addressed during this encounter; however, the patient appeared to understand and participate in the discussion about their treatment plan.   ## Further Work-up:  -- Per primary While pt on Qtc prolonging medications, please monitor & replete K+ to 4 and Mg2+ to 2 -- Re-ordered a follow-on EKG for 8/30 (and every few days while on an increased dose of aripiprazole)  -- most recent EKG on 8/30 had QtC of 451 -- Pertinent labwork reviewed earlier this admission includes: CBC, CMP. Notable for hypokalemia  ## Disposition:  -- There are no current psychiatric contraindications to discharge at this time  ## Behavioral / Environmental:  --   No specific recommendations at this time.  or Utilize compassion and acknowledge the patient's experiences while setting clear and realistic expectations for care.  ## Safety and Observation Level:  - Based on my clinical evaluation, I estimate the patient to be at increased unintentional risk of self harm in the current setting - At this time, we recommend a standard level of observation. This decision is based on my review of the chart including patient's history and current presentation, interview of the patient, mental status examination, and consideration of suicide risk  including evaluating suicidal ideation, plan, intent, suicidal or self-harm behaviors, risk factors, and protective factors. This judgment is based on our ability to  directly address suicide risk, implement suicide prevention strategies and develop a safety plan while the patient is in the clinical setting. Please contact our team if there is a concern that risk level has changed.  Suicide risk assessment  Patient has following modifiable risk factors for suicide: recklessness, which we are addressing by modifying her medications, removing steroids, and recommending delirium precautions .   Patient has following non-modifiable or demographic risk factors for suicide: separation or divorce and psychiatric hospitalization  Patient has the following protective factors against suicide: Supportive family and Cultural, spiritual, or religious beliefs that discourage suicide   Thank you for this consult request. Recommendations have been communicated to the primary team.  We will follow at this time.   [name], MD  Psychiatric and Social History   Relevant Aspects of Hospital Course:  Patient is a 62 year old female with history of asthma/COPD with FEV1 of 2023 on Breztri and Dupixent, chronic urticaria, OSA on OSA nightly sleep CPAP, bipolar disorder, obesity, hypertension, seizure disorder, vocal cord paralysis, fibromyalgia and disability who presented with shortness of breath, nonproductive cough.  Admitted on 08/11/2023 for  COVID screening test came out to be positive.  Chest x-ray did not show any pneumonia.  Hospital course remarkable for persistent dyspnea despite being on room air.  She became confused, psychotic since 8/28.  CT head/MRI of the brain negative for acute findings.  Psychiatry consulted 8/28, Steroid discontinued 8/28.   Patient Report:  8/30:  Pt was in distress when I entered the room. She was naked and crying out for nursing to help her. She complained of "still feeling drunk" and was complaining that there was a man in the room besides me. Confirmed there was no one there. She was oriented to person and place. Not  to day/month/year.   Asked her what she needed, she said she needed help going to the bathroom. Requested tech to assist me moving her to the bedside commode. Tech arrived, began to give her bedpan instead, then pt began to gag. Pt then vomited into bucket. Nurse arrived moments later.  Pt continued to have trouble remembering who I and the nurse were. She was aware of her children's names and remained firmly believing that everyone was mad at her. Re-oriented her and told her I had spoken to one of her children yesterday, reassured her that no one was mad at her and that her hospitalization was not her fault.  Overnight events: Per nursing, acute confusion/psychosis around 5 AM on 8/30, pt was thrashing around. Could be delirium or psychosis. Sounded similar to confusion episode on 8/28. Similar to what I witnessed around 10 am.  Psychiatric ROS 8/30 Mood Symptoms Pt states that she is feeling down very sad and scared. Further ROS not completed due to her tearful state.  Manic Symptoms Pt reports that this is not like her usual manic episodes. She "feels drunk" and not like herself right now.   Anxiety Symptoms Still very anxious and in distress.  Trauma Symptoms Pt denied trauma symptoms.  Psychosis Symptoms  hallucinations (Patient saw another man in the room (not me), asked why he was there); disorganized thinking (inferred from disorganized speech); grossly disorganized or abnormal motor behavior (disordered movements, arm flailing); continuing to undress / re-dress.   Collateral information: With patient permission, called: Contacted Ryan/Sierra Ledon Snare (daughter -  [phone]) at 1528 on 8/29 - No answer, no voicemail box set up. Contacted Lurena Joiner  (sister- [phone]) at 29 on 8/29 - No answer, no voicemail box set up. Contacted Reno Fong (son- [phone] & [phone]) at [phone] on 8/29 - No answer, no voicemail box set up. Contacted Barnie Alderman (daughter  - [phone]) at 28 on 8/29 - No answer, no voicemail box set up.  8/29: Got call back from Ermalinda Memos, at 15:38. She has been medically unstable for the last year or so. Frequent hospitalizations. Says patient is normally self-sufficient and lives on her own. She has a history of "some kind of mood problem", but is unaware of a psych diagnosis (did not share). Talks to his mother every other day, she sounded okay to him two days ago, but she was upset about having to stay in the hospital longer to manage her COPD/COVID. He reported her sister might be a good resource.   Psychiatric History:  Information collected from chart review  Prev Dx/Sx: Bipolar 1 Disorder Current Psych Provider: Kendrick Fries NP Current Meds: aripiprazole 10 mg daily Previous Med Trials: citalopram, fluoxetine, hydroxyzine, doxepin, lorazepam, midazolam, quetiapine, suvorexant, tizandine, trazodone, varenicline, venlafaxine, ambien, cyclobenzaprine, carbemazepine Therapy: Pt reports therapy with good effect.  Prior ECT: denies Prior Psych Hospitalization: Reports yes, cannot remember when or where  Prior Self Harm: Did not assess at this time. Prior Violence: Did not assess at this time.  Family Psych History: Pt denies, son denies Family Hx suicide: Son denies  Social History:  Developmental Hx: Grew up in [place] Educational Hx: Did not assess at this time. 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 Living Situation: Lives alone. Confirmed. Spiritual Hx: Did not assess at this time. Access to weapons: Will assess prior to D/C  Tobacco use: Former smoker Alcohol use: Did not assess at this time. Drug use: Did not assess at this time.   Exam Findings  Psychiatric Specialty Exam:  Presentation  General Appearance: -- (Naked, threw off gown when offered. Not hypersexual.)  Eye Contact:Good  Speech:Pressured  Speech  Volume:Increased  Handedness:No data recorded  Mood and Affect  Mood:Anxious; Labile; Hopeless  Affect:Labile (clear distress)   Thought Process  Thought Processes:Disorganized  Descriptions of Associations:Loose  Orientation:Partial  Thought Content:Delusions; Scattered  History of Schizophrenia/Schizoaffective disorder:No  Duration of Psychotic Symptoms:Less than six months  Hallucinations:Hallucinations: Visual; Auditory Description of Auditory Hallucinations: Pt reported hearing and seeing another man in room aside from interviewer. Description of Visual Hallucinations: Pt reported hearing and seeing another man in room aside from interviewer.  Ideas of Reference:No data recorded Suicidal Thoughts:Suicidal Thoughts: No  Homicidal Thoughts:Homicidal Thoughts: No   Sensorium  Memory:Immediate Poor; Recent Fair; Remote Fair  Judgment:Impaired  Insight:Shallow   Executive Functions  Concentration:Poor  Attention Span:Fair  Recall:Poor  Fund of Knowledge:Fair  Language:Good   Psychomotor Activity  Psychomotor Activity:Psychomotor Activity: Normal  Assets  Assets:Desire for Improvement; Manufacturing systems engineer; Social Support; Housing  Sleep  Sleep:Sleep: Poor    Physical Exam: Vital signs:  Temp:  [98 F (36.7 C)-98.4 F (36.9 C)] 98 F (36.7 C) (08/30 0800) Pulse Rate:  [72-99] 87 (08/30 0800) Resp:  [17-20] 19 (08/30 0800) BP: (82-149)/(62-96) 119/79 (08/30 0800) SpO2:  [92 %-100 %] 100 % (08/30 0800) FiO2 (%):  [21 %] 21 % (08/30 0515) Physical Exam Vitals and nursing note reviewed.  Constitutional:      General: She is in acute  distress.     Appearance: She is well-developed. She is obese. She is ill-appearing.  HENT:     Head: Normocephalic and atraumatic.  Pulmonary:     Effort: Pulmonary effort is normal.  Abdominal:     Comments: Actively vomiting while in the room with her.  Skin:    General: Skin is warm and dry.   Neurological:     Mental Status: She is disoriented.  Psychiatric:        Attention and Perception: She perceives auditory and visual hallucinations.        Mood and Affect: Mood is anxious. Affect is labile and tearful.        Speech: Speech is tangential.        Behavior: Behavior is agitated. Behavior is cooperative.        Thought Content: Thought content is delusional. Thought content is not paranoid. Thought content does not include homicidal or suicidal plan.        Cognition and Memory: Cognition is impaired.        Judgment: Judgment is impulsive.     Blood pressure 119/79, pulse 87, temperature 98 F (36.7 C), temperature source Oral, resp. rate 19, height 5\' 3"  (1.6 m), weight 83.5 kg, SpO2 100%. Body mass index is 32.61 kg/m.   Other History   These have been pulled in through the EMR, reviewed, and updated if appropriate.   Family History:   The patient's family history includes Alcohol abuse in her brother and father; Anemia in her daughter, sister, and sister; Anesthesia problems in her father; Asthma in her brother, mother, and sister; Brain cancer in her sister; Breast cancer (age of onset: 48) in her sister; COPD in her mother and sister; Colon cancer (age of onset: 76) in her father; Diabetes in her brother, father, mother, sister, sister, sister, and sister; Heart disease in her brother, brother, mother, and sister; Hypertension in her brother, daughter, sister, sister, sister, sister, and sister; Kidney disease in her father; Sleep apnea in her brother.  Medical History: Past Medical History:  Diagnosis Date   Anemia, iron deficiency 12/22/2014   pt. 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      Bipolar disorder (HCC)    with anxiety, depression   COPD (chronic  obstructive pulmonary disease) (HCC)    Eczema 05/18/2022   Fibromyalgia 05/14/2014   GERD (gastroesophageal reflux disease)    History of kidney stones    Hyperlipidemia 04/20/2017   HYPERTENSION 05/12/2009   Qualifier: Diagnosis of  By: Truman Hayward Duncan Dull), [name]     Peripheral vascular disease (HCC)    Pneumonia    Prediabetes 02/23/2014   pt. denies   Seizure (HCC)    Stroke (HCC) 11/2020   Urticaria     Surgical History: Past Surgical History:  Procedure Laterality Date   ABDOMINAL HYSTERECTOMY     ANTERIOR CERVICAL DECOMP/DISCECTOMY FUSION N/A 07/28/2020   Procedure: ANTERIOR CERVICAL DECOMPRESSION/DISCECTOMY FUSION. INTERBODY PROTHESIS, PLATE/SCREWS CERVICAL THREE- CERVICAL FOUR, CERVICAL FOUR- CERVICAL FIVE;  Surgeon: Tressie Stalker, MD;  Location: [facility];  Service: Neurosurgery;  Laterality: N/A;   BACK SURGERY     COLONOSCOPY  12/20/2011   Procedure: COLONOSCOPY;  Surgeon: Freddy Jaksch, MD;  Location: [facility] END[facility]COPY;  Service: Endoscopy;  Laterality: N/A;   COLONOSCOPY  03/05/2012  Procedure: COLONOSCOPY;  Surgeon: Freddy Jaksch, MD;  Location: [facility] ENDOSCOPY;  Service: Endoscopy;  Laterality: N/A;   DIAGNOSTIC LAPAROSCOPY     HEMORRHOID SURGERY     INCISE AND DRAIN ABCESS     KIDNEY STONE SURGERY     NECK SURGERY     x 2 Dr Terrilee Files   SPINE SURGERY  2019   TOE SURGERY     TUBAL LIGATION      Medications:   Current Facility-Administered Medications:    acetaminophen (TYLENOL) tablet 650 mg, 650 mg, Oral, Q6H PRN **OR** acetaminophen (TYLENOL) suppository 650 mg, 650 mg, Rectal, Q6H PRN, Howerter, Justin B, DO   albuterol (PROVENTIL) (2.5 MG/3ML) 0.083% nebulizer solution 2.5 mg, 2.5 mg, Nebulization, Q4H PRN, Howerter, Justin B, DO, 2.5 mg at 08/13/23 0500   ARIPiprazole (ABILIFY) tablet 10 mg, 10 mg, Oral, Once, Sholonda Jobst, Byrd Hesselbach, MD   ARIPiprazole (ABILIFY) tablet 20 mg, 20 mg, Oral, Daily, [name], MD, 20 mg at 08/16/23  6295   bisacodyl (DULCOLAX) suppository 10 mg, 10 mg, Rectal, Once, Burnadette Pop, MD   carbamazepine (TEGRETOL) tablet 400 mg, 400 mg, Oral, Daily, 400 mg at 08/15/23 0805 **AND** carbamazepine (TEGRETOL) tablet 600 mg, 600 mg, Oral, QHS, Howerter, Justin B, DO, 600 mg at 08/15/23 2222   enoxaparin (LOVENOX) injection 40 mg, 40 mg, Subcutaneous, Daily, Danford, Earl Lites, MD, 40 mg at 08/16/23 0811   famotidine (PEPCID) tablet 40 mg, 40 mg, Oral, QHS, Howerter, Justin B, DO, 40 mg at 08/15/23 2221   ferrous sulfate tablet 325 mg, 325 mg, Oral, Q breakfast, Howerter, Justin B, DO, 325 mg at 08/16/23 0812   fluticasone (FLONASE) 50 MCG/ACT nasal spray 1 spray, 1 spray, Each Nare, BID PRN, Danford, Earl Lites, MD   insulin aspart (novoLOG) injection 0-9 Units, 0-9 Units, Subcutaneous, TID WC, Howerter, Justin B, DO, 2 Units at 08/16/23 0813   ipratropium-albuterol (DUONEB) 0.5-2.5 (3) MG/3ML nebulizer solution 3 mL, 3 mL, Nebulization, BID, Danford, Earl Lites, MD, 3 mL at 08/16/23 0745   labetalol (NORMODYNE) injection 10 mg, 10 mg, Intravenous, Q2H PRN, Adhikari, Amrit, MD   lactulose (CHRONULAC) 10 GM/15ML solution 10 g, 10 g, Oral, TID, Adhikari, Amrit, MD, 10 g at 08/16/23 2841   lithium carbonate capsule 150 mg, 150 mg, Oral, BID WC, Branden Shallenberger, Byrd Hesselbach, MD   loratadine (CLARITIN) tablet 10 mg, 10 mg, Oral, Daily, Danford, Earl Lites, MD, 10 mg at 08/16/23 3244   mometasone-formoterol (DULERA) 200-5 MCG/ACT inhaler 2 puff, 2 puff, Inhalation, BID, Danford, Earl Lites, MD, 2 puff at 08/16/23 0748   montelukast (SINGULAIR) tablet 10 mg, 10 mg, Oral, QHS, Howerter, Justin B, DO, 10 mg at 08/15/23 2221   ondansetron (ZOFRAN) injection 4 mg, 4 mg, Intravenous, Q6H PRN, Howerter, Justin B, DO, 4 mg at 08/16/23 0102   Oral care mouth rinse, 15 mL, Mouth Rinse, PRN, Danford, Earl Lites, MD   pantoprazole (PROTONIX) EC tablet 40 mg, 40 mg, Oral, BID, Howerter, Justin B,  DO, 40 mg at 08/16/23 0813   polyethylene glycol (MIRALAX / GLYCOLAX) packet 17 g, 17 g, Oral, Daily, Adhikari, Amrit, MD, 17 g at 08/16/23 0811   senna (SENOKOT) tablet 8.6 mg, 1 tablet, Oral, BID, Adhikari, Amrit, MD, 8.6 mg at 08/16/23 7253  Allergies: Allergies  Allergen Reactions   Sulfa Antibiotics Nausea And Vomiting and Other (See Comments)   Venofer [Iron Sucrose] Hives

## 2023-08-17 DIAGNOSIS — G9340 Encephalopathy, unspecified: Secondary | ICD-10-CM | POA: Diagnosis not present

## 2023-08-17 DIAGNOSIS — T380X5A Adverse effect of glucocorticoids and synthetic analogues, initial encounter: Secondary | ICD-10-CM | POA: Diagnosis not present

## 2023-08-17 DIAGNOSIS — J441 Chronic obstructive pulmonary disease with (acute) exacerbation: Secondary | ICD-10-CM | POA: Diagnosis not present

## 2023-08-17 DIAGNOSIS — F060 Psychotic disorder with hallucinations due to known physiological condition: Secondary | ICD-10-CM

## 2023-08-17 DIAGNOSIS — U071 COVID-19: Secondary | ICD-10-CM | POA: Diagnosis not present

## 2023-08-17 DIAGNOSIS — F319 Bipolar disorder, unspecified: Secondary | ICD-10-CM | POA: Diagnosis not present

## 2023-08-17 LAB — CBC
HCT: 36.8 % (ref 36.0–46.0)
Hemoglobin: 11.6 g/dL — ABNORMAL LOW (ref 12.0–15.0)
MCH: 26.9 pg (ref 26.0–34.0)
MCHC: 31.5 g/dL (ref 30.0–36.0)
MCV: 85.2 fL (ref 80.0–100.0)
Platelets: 501 10*3/uL — ABNORMAL HIGH (ref 150–400)
RBC: 4.32 MIL/uL (ref 3.87–5.11)
RDW: 15.9 % — ABNORMAL HIGH (ref 11.5–15.5)
WBC: 20.7 10*3/uL — ABNORMAL HIGH (ref 4.0–10.5)
nRBC: 0.1 % (ref 0.0–0.2)

## 2023-08-17 LAB — GLUCOSE, CAPILLARY
Glucose-Capillary: 128 mg/dL — ABNORMAL HIGH (ref 70–99)
Glucose-Capillary: 175 mg/dL — ABNORMAL HIGH (ref 70–99)
Glucose-Capillary: 170 mg/dL — ABNORMAL HIGH (ref 70–99)

## 2023-08-17 LAB — BASIC METABOLIC PANEL
Anion gap: 14 (ref 5–15)
BUN: 13 mg/dL (ref 6–20)
CO2: 25 mmol/L (ref 22–32)
Calcium: 9.1 mg/dL (ref 8.9–10.3)
Chloride: 96 mmol/L — ABNORMAL LOW (ref 98–111)
Creatinine, Ser: 0.81 mg/dL (ref 0.44–1.00)
GFR, Estimated: >60 mL/min (ref >60)
Glucose, Bld: 135 mg/dL — ABNORMAL HIGH (ref 70–99)
Potassium: 4.1 mmol/L (ref 3.5–5.1)
Sodium: 135 mmol/L (ref 135–145)

## 2023-08-17 LAB — CULTURE, BLOOD (ROUTINE X 2)
Culture: NO GROWTH
Culture: NO GROWTH
Special Requests: ADEQUATE
Special Requests: ADEQUATE

## 2023-08-17 NOTE — Progress Notes (Signed)
[facility]  PROGRESS NOTE  [name] NFA:[id number] DOB: 1961/05/07 DOA: 08/11/2023 PCP: Corwin Levins, MD   Brief HPI:   62 year old female with history of asthma/COPD with FEV1 of 2023 on Breztri and Dupixent, chronic urticaria, OSA on OSA nightly sleep CPAP, bipolar disorder, obesity, hypertension, seizure disorder, vocal cord paralysis, fibromyalgia and disability who presented with shortness of breath, nonproductive cough.  On presentation, COVID screening test came out to be positive.  Chest x-ray did not show any pneumonia.  Hospital course remarkable for persistent dyspnea despite being on room air.  She became confused, psychotic since 8/28.  CT head/MRI of the brain negative for acute findings.  Psychiatry consulted and following.  Adjusting medications     Assessment/Plan:   Acute exacerbation of COPD: This is secondary to COVID infection.  Chest x-ray did not show any pneumonia.  Presented with shortness of breath, cough. She was on  Solu-Medrol, azithromycin, bronchodilators, Zyrtec, Flonase, Singulair.  Continue intensive spirometry, flutter valve.  Continue PPI.She  remained on room air.  Steroids discontinued due to concern for psychosis.  No wheezing today   COVID infection: Diagnosed about 5 days ago before admission.  Outside window for Paxlovid.  No infiltrates or hypoxia.  Continue supportive care.   Altered mentation /psychosis/history of bipolar disorder: She was found to be confused on 8/28.  Rapid response was called.She does not have any focal deficits.  She was complaining of being drowsy/drunk.  She was on a scheduled hydroxyzine which has been discontinued.  CT head was done which did not show any acute findings.  MRI of the brain did not show any acute findings.  Mildly elevated ammonia which may not have contributed to confusion.  Steroid discontinued due to concern for steroid induced psychosis.  Psychiatry consulted and following .increased the  dose of aripiprazole, starting lithium.  Tegretol will be to be continued   Leukocytosis: This is most likely reactive secondary to steroids.  Continue to monitor .   Elevated liver enzymes: In the setting of COVID.  Continue to monitor.  Statin on hold   OSA: Continue CPAP at night.   Obesity: BMI of 32   Hypokalemia/hypophosphatemia: Being monitored and supplemented as needed   Cirrhosis: Well compensated.  No ascites   Hypertension: Takes thiazide, ARB  at home.  Blood pressure was soft overnight and these medical since discontinued   Seizure disorder: On Tegretol.   Chronic iron deficiency  anemia: Current hemoglobin stable.   Peripheral vascular disease: Takes statin at home.  Currently on hold due to elevated LFT   Constipation: Continue bowel regimen        Medications     ARIPiprazole  20 mg Oral Daily   carbamazepine  400 mg Oral Daily   And   carbamazepine  600 mg Oral QHS   enoxaparin (LOVENOX) injection  40 mg Subcutaneous Daily   famotidine  40 mg Oral QHS   ferrous sulfate  325 mg Oral Q breakfast   insulin aspart  0-9 Units Subcutaneous TID WC   ipratropium-albuterol  3 mL Nebulization BID   lactulose  10 g Oral TID   lithium carbonate  150 mg Oral BID WC   loratadine  10 mg Oral Daily   mometasone-formoterol  2 puff Inhalation BID   montelukast  10 mg Oral QHS   pantoprazole  40 mg Oral BID   polyethylene glycol  17 g Oral Daily   senna  1 tablet Oral BID  Data Reviewed:   CBG:  Recent Labs  Lab 08/16/23 1147 08/16/23 1637 08/16/23 2140 08/17/23 0618 08/17/23 1108  GLUCAP 146* 118* 140* 128* 175*    SpO2: 97 % O2 Flow Rate (L/min): 2 L/min FiO2 (%): 21 %    Vitals:   08/17/23 0149 08/17/23 0300 08/17/23 0824 08/17/23 0922  BP: (!) 118/101 (!) 102/90  (!) 124/91  Pulse: (!) 104 92 (!) 104 (!) 106  Resp:   20 17  Temp: 99.3 F (37.4 C) 98.7 F (37.1 C)  98.3 F (36.8 C)  TempSrc: Axillary Oral  Oral  SpO2: 100% 94% 100%  97%  Weight:  82.2 kg    Height:          Data Reviewed:  Basic Metabolic Panel: Recent Labs  Lab 08/12/23 0231 08/12/23 0239 08/13/23 0545 08/13/23 0552 08/14/23 0411 08/15/23 0345 08/16/23 0625 08/17/23 0305  NA 136   < >  --  138 135 136 135 135  K 3.1*   < >  --  3.0* 3.2* 3.0* 3.8 4.1  CL 99  --   --  98 93* 91* 94* 96*  CO2 25  --   --  29 33* 33* 28 25  GLUCOSE 276*  --   --  228* 162* 153* 162* 135*  BUN 14  --   --  13 17 13 19 13   CREATININE 0.90  --   --  0.84 0.81 0.86 0.89 0.81  CALCIUM 8.5*  --   --  9.1 8.5* 8.6* 8.7* 9.1  MG 2.4  --   --   --   --   --   --   --   PHOS 1.9*  --  3.0  --  3.5  --   --   --    < > = values in this interval not displayed.    CBC: Recent Labs  Lab 08/11/23 2130 08/11/23 2242 08/12/23 0231 08/12/23 0239 08/13/23 0552 08/14/23 0411 08/15/23 0345 08/16/23 0625 08/17/23 0305  WBC 13.1*  --  11.8*  --  20.0* 19.5* 17.8* 14.8* 20.7*  NEUTROABS 11.0*  --  10.2*  --   --   --   --   --   --   HGB 10.2*   < > 9.3*   < > 9.8* 9.3* 11.3* 11.7* 11.6*  HCT 33.5*   < > 29.6*   < > 30.6* 29.8* 36.3 37.5 36.8  MCV 85.2  --  86.5  --  84.8 83.0 84.0 83.5 85.2  PLT 512*  --  427*  --  495* 450* 484* 514* 501*   < > = values in this interval not displayed.    LFT Recent Labs  Lab 08/12/23 0231 08/13/23 0552 08/14/23 0411 08/15/23 0345  AST 50* 57* 61* 41  ALT 88* 118* 135* 139*  ALKPHOS 60 57 59 66  BILITOT 0.5 0.1* 0.2* <0.1*  PROT 6.0* 6.3* 5.7* 6.4*  ALBUMIN 3.4* 3.6 3.4* 3.7     Antibiotics: Anti-infectives (From admission, onward)    Start     Dose/Rate Route Frequency Ordered Stop   08/12/23 0145  azithromycin (ZITHROMAX) 500 mg in sodium chloride 0.9 % 250 mL IVPB  Status:  Discontinued       Note to Pharmacy: (In setting of acute copd exac)   500 mg 250 mL/hr over 60 Minutes Intravenous Daily at bedtime 08/12/23 0123 08/15/23 1304        DVT prophylaxis: Lovenox  Code Status: Full code  Family  Communication:    CONSULTS    Subjective   Denies shortness of breath   Objective    Physical Examination:   General-appears in no acute distress Heart-S1-S2, regular, no murmur auscultated Lungs-clear to auscultation bilaterally, no wheezing or crackles auscultated Abdomen-soft, nontender, no organomegaly Extremities-no edema in the lower extremities Neuro-alert, oriented x3, no focal deficit noted   Status is: Inpatient:             Meredeth Ide   [facility] If 7PM-7AM, please contact night-coverage at [email or web address], Office  [phone]   08/17/2023, 11:27 AM  LOS: 5 days

## 2023-08-17 NOTE — Progress Notes (Signed)
   08/17/23 0030  BiPAP/CPAP/SIPAP  $ Non-Invasive Ventilator  Non-Invasive Vent Subsequent  $ Face Mask Medium Yes  BiPAP/CPAP/SIPAP Pt Type Adult  BiPAP/CPAP/SIPAP Resmed  Mask Type Full face mask  Mask Size Medium  IPAP 12 cmH20  EPAP 6 cmH2O  Pressure Support 6 cmH20  Flow Rate 2 lpm  Patient Home Equipment No  Auto Titrate No  Safety Check Completed by RT for Home Unit Yes, no issues noted

## 2023-08-17 NOTE — Plan of Care (Signed)
  Problem: Education: Goal: Knowledge of risk factors and measures for prevention of condition will improve Outcome: Progressing   Problem: Coping: Goal: Psychosocial and spiritual needs will be supported Outcome: Progressing   Problem: Respiratory: Goal: Will maintain a patent airway Outcome: Progressing Goal: Complications related to the disease process, condition or treatment will be avoided or minimized Outcome: Progressing   Problem: Education: Goal: Ability to describe self-care measures that may prevent or decrease complications (Diabetes Survival Skills Education) will improve Outcome: Progressing Goal: Individualized Educational Video(s) Outcome: Progressing   Problem: Coping: Goal: Ability to adjust to condition or change in health will improve Outcome: Progressing   Problem: Fluid Volume: Goal: Ability to maintain a balanced intake and output will improve Outcome: Progressing   Problem: Health Behavior/Discharge Planning: Goal: Ability to identify and utilize available resources and services will improve Outcome: Progressing Goal: Ability to manage health-related needs will improve Outcome: Progressing   Problem: Metabolic: Goal: Ability to maintain appropriate glucose levels will improve Outcome: Progressing   Problem: Nutritional: Goal: Maintenance of adequate nutrition will improve Outcome: Progressing Goal: Progress toward achieving an optimal weight will improve Outcome: Progressing   Problem: Skin Integrity: Goal: Risk for impaired skin integrity will decrease Outcome: Progressing   Problem: Tissue Perfusion: Goal: Adequacy of tissue perfusion will improve Outcome: Progressing   Problem: Education: Goal: Knowledge of General Education information will improve Description: Including pain rating scale, medication(s)/side effects and non-pharmacologic comfort measures Outcome: Progressing   Problem: Health Behavior/Discharge Planning: Goal:  Ability to manage health-related needs will improve Outcome: Progressing   Problem: Clinical Measurements: Goal: Ability to maintain clinical measurements within normal limits will improve Outcome: Progressing Goal: Will remain free from infection Outcome: Progressing Goal: Diagnostic test results will improve Outcome: Progressing Goal: Respiratory complications will improve Outcome: Progressing Goal: Cardiovascular complication will be avoided Outcome: Progressing   Problem: Activity: Goal: Risk for activity intolerance will decrease Outcome: Progressing   Problem: Nutrition: Goal: Adequate nutrition will be maintained Outcome: Progressing   Problem: Coping: Goal: Level of anxiety will decrease Outcome: Progressing   Problem: Elimination: Goal: Will not experience complications related to bowel motility Outcome: Progressing Goal: Will not experience complications related to urinary retention Outcome: Progressing   Problem: Pain Managment: Goal: General experience of comfort will improve Outcome: Progressing   Problem: Safety: Goal: Ability to remain free from injury will improve Outcome: Progressing   Problem: Skin Integrity: Goal: Risk for impaired skin integrity will decrease Outcome: Progressing   

## 2023-08-17 NOTE — Plan of Care (Signed)
Problem: Education: Goal: Knowledge of risk factors and measures for prevention of condition will improve 08/17/2023 1341 by Louanne Belton, RN Outcome: Progressing 08/17/2023 1340 by Louanne Belton, RN Outcome: Progressing   Problem: Coping: Goal: Psychosocial and spiritual needs will be supported 08/17/2023 1341 by Louanne Belton, RN Outcome: Progressing 08/17/2023 1340 by Louanne Belton, RN Outcome: Progressing   Problem: Respiratory: Goal: Will maintain a patent airway 08/17/2023 1341 by Louanne Belton, RN Outcome: Progressing 08/17/2023 1340 by Louanne Belton, RN Outcome: Progressing Goal: Complications related to the disease process, condition or treatment will be avoided or minimized 08/17/2023 1341 by Louanne Belton, RN Outcome: Progressing 08/17/2023 1340 by Louanne Belton, RN Outcome: Progressing   Problem: Education: Goal: Ability to describe self-care measures that may prevent or decrease complications (Diabetes Survival Skills Education) will improve 08/17/2023 1341 by Louanne Belton, RN Outcome: Progressing 08/17/2023 1340 by Louanne Belton, RN Outcome: Progressing Goal: Individualized Educational Video(s) 08/17/2023 1341 by Louanne Belton, RN Outcome: Progressing 08/17/2023 1340 by Louanne Belton, RN Outcome: Progressing   Problem: Coping: Goal: Ability to adjust to condition or change in health will improve 08/17/2023 1341 by Louanne Belton, RN Outcome: Progressing 08/17/2023 1340 by Louanne Belton, RN Outcome: Progressing   Problem: Fluid Volume: Goal: Ability to maintain a balanced intake and output will improve 08/17/2023 1341 by Louanne Belton, RN Outcome: Progressing 08/17/2023 1340 by Louanne Belton, RN Outcome: Progressing   Problem: Health Behavior/Discharge Planning: Goal: Ability to identify and utilize available resources and services will improve 08/17/2023 1341 by Louanne Belton, RN Outcome: Progressing 08/17/2023 1340 by Louanne Belton, RN Outcome: Progressing Goal: Ability to manage health-related needs will improve 08/17/2023 1341 by Louanne Belton, RN Outcome: Progressing 08/17/2023 1340 by Louanne Belton, RN Outcome: Progressing   Problem: Metabolic: Goal: Ability to maintain appropriate glucose levels will improve 08/17/2023 1341 by Louanne Belton, RN Outcome: Progressing 08/17/2023 1340 by Louanne Belton, RN Outcome: Progressing   Problem: Nutritional: Goal: Maintenance of adequate nutrition will improve 08/17/2023 1341 by Louanne Belton, RN Outcome: Progressing 08/17/2023 1340 by Louanne Belton, RN Outcome: Progressing Goal: Progress toward achieving an optimal weight will improve 08/17/2023 1341 by Louanne Belton, RN Outcome: Progressing 08/17/2023 1340 by Louanne Belton, RN Outcome: Progressing   Problem: Skin Integrity: Goal: Risk for impaired skin integrity will decrease 08/17/2023 1341 by Louanne Belton, RN Outcome: Progressing 08/17/2023 1340 by Louanne Belton, RN Outcome: Progressing   Problem: Tissue Perfusion: Goal: Adequacy of tissue perfusion will improve 08/17/2023 1341 by Louanne Belton, RN Outcome: Progressing 08/17/2023 1340 by Louanne Belton, RN Outcome: Progressing   Problem: Education: Goal: Knowledge of General Education information will improve Description: Including pain rating scale, medication(s)/side effects and non-pharmacologic comfort measures 08/17/2023 1341 by Louanne Belton, RN Outcome: Progressing 08/17/2023 1340 by Louanne Belton, RN Outcome: Progressing   Problem: Health Behavior/Discharge Planning: Goal: Ability to manage health-related needs will improve 08/17/2023 1341 by Louanne Belton, RN Outcome: Progressing 08/17/2023 1340 by Louanne Belton, RN Outcome: Progressing   Problem: Clinical Measurements: Goal: Ability to maintain clinical measurements within normal limits will improve 08/17/2023 1341 by Louanne Belton, RN Outcome:  Progressing 08/17/2023 1340 by Louanne Belton, RN Outcome: Progressing Goal: Will remain free from infection 08/17/2023 1341 by Louanne Belton, RN Outcome: Progressing 08/17/2023 1340 by Louanne Belton, RN Outcome: Progressing Goal: Diagnostic test  results will improve 08/17/2023 1341 by Louanne Belton, RN Outcome: Progressing 08/17/2023 1340 by Louanne Belton, RN Outcome: Progressing Goal: Respiratory complications will improve 08/17/2023 1341 by Louanne Belton, RN Outcome: Progressing 08/17/2023 1340 by Louanne Belton, RN Outcome: Progressing Goal: Cardiovascular complication will be avoided 08/17/2023 1341 by Louanne Belton, RN Outcome: Progressing 08/17/2023 1340 by Louanne Belton, RN Outcome: Progressing   Problem: Activity: Goal: Risk for activity intolerance will decrease 08/17/2023 1341 by Louanne Belton, RN Outcome: Progressing 08/17/2023 1340 by Louanne Belton, RN Outcome: Progressing   Problem: Nutrition: Goal: Adequate nutrition will be maintained 08/17/2023 1341 by Louanne Belton, RN Outcome: Progressing 08/17/2023 1340 by Louanne Belton, RN Outcome: Progressing   Problem: Coping: Goal: Level of anxiety will decrease 08/17/2023 1341 by Louanne Belton, RN Outcome: Progressing 08/17/2023 1340 by Louanne Belton, RN Outcome: Progressing   Problem: Elimination: Goal: Will not experience complications related to bowel motility 08/17/2023 1341 by Louanne Belton, RN Outcome: Progressing 08/17/2023 1340 by Louanne Belton, RN Outcome: Progressing Goal: Will not experience complications related to urinary retention 08/17/2023 1341 by Louanne Belton, RN Outcome: Progressing 08/17/2023 1340 by Louanne Belton, RN Outcome: Progressing   Problem: Pain Managment: Goal: General experience of comfort will improve 08/17/2023 1341 by Louanne Belton, RN Outcome: Progressing 08/17/2023 1340 by Louanne Belton, RN Outcome: Progressing   Problem: Safety: Goal:  Ability to remain free from injury will improve 08/17/2023 1341 by Louanne Belton, RN Outcome: Progressing 08/17/2023 1340 by Louanne Belton, RN Outcome: Progressing   Problem: Skin Integrity: Goal: Risk for impaired skin integrity will decrease 08/17/2023 1341 by Louanne Belton, RN Outcome: Progressing 08/17/2023 1340 by Louanne Belton, RN Outcome: Progressing

## 2023-08-17 NOTE — Progress Notes (Signed)
Mobility Specialist Progress Note    08/17/23 1507  Mobility  Activity Ambulated with assistance to bathroom;Ambulated with assistance in hallway  Level of Assistance Minimal assist, patient does 75% or more  Assistive Device Front wheel walker  Distance Ambulated (ft) 130 ft (15+115)  Activity Response Tolerated well  Mobility Referral Yes  $Mobility charge 1 Mobility  Mobility Specialist Start Time (ACUTE ONLY) 1445  Mobility Specialist Stop Time (ACUTE ONLY) 1504  Mobility Specialist Time Calculation (min) (ACUTE ONLY) 19 min   Pt received in chair and agreeable. No complaints on walk. Returned to bed with call bell in reach and alarm on.   [place] Mobility Specialist  Please Neurosurgeon or Rehab Office at [phone]

## 2023-08-17 NOTE — Consult Note (Signed)
[facility] Consult Evaluation  Service Date: August 17, 2023 LOS:  LOS: 5 days    Primary Psychiatric Diagnoses  Acute encephalopathy, unclear etiology 2.  Steroid-induced psychosis? 3.  Bipolar 1 disorder  Assessment  [name] is a 62 y.o. female admitted medically on 08/11/2023  9:22 PM for COVID-19 and COPD exacerbation. She carries the psychiatric diagnoses of bipolar 1 disorder and intermittent explosive disorder. She has a past medical history of  COPD, anemia, prediabetes, Seizures, Stroke, Eczema, fibromyalgia, hyperlipidemia, and hypertension. Psychiatry was consulted for altered mental status by Burnadette Pop, MD  on 8/29.   Her current presentation of altered mental status is most consistent with steroid-induced psychosis. She meets criteria for bipolar disorder based on past history, patient interview.  Current outpatient psychotropic medications include Abilify 10 mg daily and historically she has had a good response to these medications. She was compliant with medications prior to admission as evidenced by fill history. On initial examination, patient was cooperative, but tearful and fearful of her medical condition and reported "feeling drunk".   Increasing antipsychotic and adding on lithium to assist with mood stability and COVID-19.   Please see plan below for detailed recommendations.   Diagnoses:  Active Hospital problems: Principal Problem:   COVID-19 virus infection Active Problems:   Headache(784.0)   Peripheral vascular disease (HCC)   Chronic iron deficiency anemia   Bipolar disorder (HCC)   Seizure disorder (HCC)   Hypophosphatemia   Essential hypertension   Cirrhosis (HCC)   Acute exacerbation of chronic obstructive pulmonary disease (COPD) (HCC)   Hypokalemia   Obesity (BMI 30-39.9)   OSA (obstructive sleep apnea)   Transaminitis   Steroid-induced psychosis, with delusions (HCC)    Plan   ## Psychiatric Medication  Recommendations:  -- Increase Aripiprazole 10 mg daily to 30 mg daily, starting 8/31, lasting for approximately 2 weeks or until symptoms subside       -- Supplemental dose 10 mg ordered on 8/30 at 11 am -- If possible, avoid further steroids during her hospital course. -- Start Lithium 150 mg Q12       --Lithium has been shown to assist with mood stabilization AND improve clinical course of patients with COVID-19: [email or web address]  ## Medical Decision Making Capacity:   Capacity was not formally addressed during this encounter; however, the patient appeared to understand and participate in the discussion about their treatment plan.   ## Further Work-up:  -- Per primary While pt on Qtc prolonging medications, please monitor & replete K+ to 4 and Mg2+ to 2 -- Re-ordered a follow-on EKG for 8/30 (and every few days while on an increased dose of aripiprazole)  -- most recent EKG on 8/30 had QtC of 451 -- Pertinent labwork reviewed earlier this admission includes: CBC, CMP. Notable for hypokalemia  ## Disposition:  -- There are no current psychiatric contraindications to discharge at this time  ## Behavioral / Environmental:  --   No specific recommendations at this time.  or Utilize compassion and acknowledge the patient's experiences while setting clear and realistic expectations for care.  ## Safety and Observation Level:  - Based on my clinical evaluation, I estimate the patient to be at increased unintentional risk of self harm in the current setting - At this time, we recommend a standard level of observation. This decision is based on my review of the chart including patient's history and current presentation, interview of the patient, mental status examination, and consideration of suicide risk  including evaluating suicidal ideation, plan, intent, suicidal or self-harm behaviors, risk factors, and protective factors. This judgment is based on our ability to  directly address suicide risk, implement suicide prevention strategies and develop a safety plan while the patient is in the clinical setting. Please contact our team if there is a concern that risk level has changed.  Suicide risk assessment  Patient has following modifiable risk factors for suicide: recklessness, which we are addressing by modifying her medications, removing steroids, and recommending delirium precautions .   Patient has following non-modifiable or demographic risk factors for suicide: separation or divorce and psychiatric hospitalization  Patient has the following protective factors against suicide: Supportive family and Cultural, spiritual, or religious beliefs that discourage suicide   Thank you for this consult request. Recommendations have been communicated to the primary team.  We will follow at this time.   Thedore Mins, MD  Psychiatric and Social History   Relevant Aspects of Hospital Course:  Patient is a 62 year old female with history of asthma/COPD with FEV1 of 2023 on Breztri and Dupixent, chronic urticaria, OSA on OSA nightly sleep CPAP, bipolar disorder, obesity, hypertension, seizure disorder, vocal cord paralysis, fibromyalgia and disability who presented with shortness of breath, nonproductive cough.  Admitted on 08/11/2023 for  COVID screening test came out to be positive.  Chest x-ray did not show any pneumonia.  Hospital course remarkable for persistent dyspnea despite being on room air.  She became confused, psychotic since 8/28.  CT head/MRI of the brain negative for acute findings.  Psychiatry consulted 8/28, Steroid discontinued 8/28.   Progress Note: 08/17/23 Patient seen face to face in her hospital room. She states that she is still having body pain but has improved mentally. Patient declared  that she is not crazy but often has visual hallucinations of her parent whenever she is in too much pain or stressed out. She states: '' my parents are my  guardian angel, they always appear to me whenever I am in distress, they do not want me to suffer and I am grateful to them.'' Today, patient denies delusions, auditory hallucinations, depression and states that she is less anxious because she is getting better.  Patient Report:  8/30:  Pt was in distress when I entered the room. She was naked and crying out for nursing to help her. She complained of "still feeling drunk" and was complaining that there was a man in the room besides me. Confirmed there was no one there. She was oriented to person and place. Not to day/month/year.   Asked her what she needed, she said she needed help going to the bathroom. Requested tech to assist me moving her to the bedside commode. Tech arrived, began to give her bedpan instead, then pt began to gag. Pt then vomited into bucket. Nurse arrived moments later.  Pt continued to have trouble remembering who I and the nurse were. She was aware of her children's names and remained firmly believing that everyone was mad at her. Re-oriented her and told her I had spoken to one of her children yesterday, reassured her that no one was mad at her and that her hospitalization was not her fault.  Overnight events: Per nursing, acute confusion/psychosis around 5 AM on 8/30, pt was thrashing around. Could be delirium or psychosis. Sounded similar to confusion episode on 8/28. Similar to what I witnessed around 10 am.  Psychiatric ROS 8/30 Mood Symptoms Pt states that she is feeling down very sad and scared. Further  ROS not completed due to her tearful state.  Manic Symptoms Pt reports that this is not like her usual manic episodes. She "feels drunk" and not like herself right now.   Anxiety Symptoms Still very anxious and in distress.  Trauma Symptoms Pt denied trauma symptoms.  Psychosis Symptoms  hallucinations (Patient saw another man in the room (not me), asked why he was there); disorganized thinking (inferred from  disorganized speech); grossly disorganized or abnormal motor behavior (disordered movements, arm flailing); continuing to undress / re-dress.   Collateral information: With patient permission, called: Contacted Ryan/Sierra Ledon Snare (daughter - [phone]) at 61 on 8/29 - No answer, no voicemail box set up. Contacted Lurena Joiner  (sister- [phone]) at 21 on 8/29 - No answer, no voicemail box set up. Contacted Raina Loeffler (son- [phone] & [phone]) at [phone] on 8/29 - No answer, no voicemail box set up. Contacted Barnie Alderman (daughter - [phone]) at [phone] on 8/29 - No answer, no voicemail box set up.  8/29: Got call back from Ermalinda Memos, at 15:38. She has been medically unstable for the last year or so. Frequent hospitalizations. Says patient is normally self-sufficient and lives on her own. She has a history of "some kind of mood problem", but is unaware of a psych diagnosis (did not share). Talks to his mother every other day, she sounded okay to him two days ago, but she was upset about having to stay in the hospital longer to manage her COPD/COVID. He reported her sister might be a good resource.   Psychiatric History:  Information collected from chart review  Prev Dx/Sx: Bipolar 1 Disorder Current Psych Provider: Kendrick Fries NP Current Meds: aripiprazole 10 mg daily Previous Med Trials: citalopram, fluoxetine, hydroxyzine, doxepin, lorazepam, midazolam, quetiapine, suvorexant, tizandine, trazodone, varenicline, venlafaxine, ambien, cyclobenzaprine, carbemazepine Therapy: Pt reports therapy with good effect.  Prior ECT: denies Prior Psych Hospitalization: Reports yes, cannot remember when or where  Prior Self Harm: Did not assess at this time. Prior Violence: Did not assess at this time.  Family Psych History: Pt denies, son denies Family Hx suicide: Son denies  Social History:  Developmental Hx: Grew up in [place] Educational Hx:  Did not assess at this time. 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 Living Situation: Lives alone. Confirmed. Spiritual Hx: Did not assess at this time. Access to weapons: Will assess prior to D/C  Tobacco use: Former smoker Alcohol use: Did not assess at this time. Drug use: Did not assess at this time.   Exam Findings  Psychiatric Specialty Exam:  Presentation  General Appearance: -- (Naked, threw off gown when offered. Not hypersexual.)  Eye Contact:Good  Speech:Pressured  Speech Volume:Increased  Handedness:No data recorded  Mood and Affect  Mood:Anxious; Labile; Hopeless  Affect:Labile (clear distress)   Thought Process  Thought Processes:logical  Descriptions of Associations:Loose  Orientation:Partial  Thought Content:Delusions; Scattered  History of Schizophrenia/Schizoaffective disorder:No  Duration of Psychotic Symptoms:Less than six months  Hallucinations:Hallucinations: Visual Description of Visual Hallucinations: Pt reported seeing her parents  in room aside from interviewer.  Ideas of Reference:No data recorded Suicidal Thoughts:Suicidal Thoughts: No  Homicidal Thoughts:Homicidal Thoughts: No   Sensorium  Memory:Immediate Poor; Recent Fair; Remote Fair  Judgment:Impaired  Insight:Shallow   Executive Functions  Concentration: fairr  Attention Span:Fair  Recall:fair Fund of Knowledge:Fair  Language:Good   Psychomotor Activity  Psychomotor Activity:No data recorded  Assets  Assets:Desire for Improvement; Manufacturing systems engineer; Social Support; Housing  Sleep  Sleep:Sleep: fair    Physical Exam: Vital signs:  Temp:  [97.9 F (36.6 C)-99.3 F (37.4 C)] 98.3 F (36.8 C) (08/31 0922) Pulse Rate:  [85-118] 106 (08/31 0922) Resp:  [17-20] 17 (08/31 0922) BP: (102-157)/(83-101) 124/91 (08/31 0922) SpO2:  [94 %-100 %] 97 % (08/31 0922) Weight:  [82.2 kg] 82.2 kg (08/31  0300) Physical Exam Vitals and nursing note reviewed.  Constitutional:      General: She is in acute distress.     Appearance: She is well-developed. She is obese. She is ill-appearing.  HENT:     Head: Normocephalic and atraumatic.  Pulmonary:     Effort: Pulmonary effort is normal.  Abdominal:     Comments: Actively vomiting while in the room with her.  Skin:    General: Skin is warm and dry.  Neurological:     Mental Status: She is disoriented.  Psychiatric:        Attention and Perception: She perceives visual hallucinations.        Behavior: Behavior is cooperative.        Thought Content: Thought content is not paranoid. Thought content does not include homicidal or suicidal plan.        Cognition and Memory: Cognition is impaired.     Blood pressure (!) 124/91, pulse (!) 106, temperature 98.3 F (36.8 C), temperature source Oral, resp. rate 17, height 5\' 3"  (1.6 m), weight 82.2 kg, SpO2 97%. Body mass index is 32.1 kg/m.   Other History   These have been pulled in through the EMR, reviewed, and updated if appropriate.   Family History:   The patient's family history includes Alcohol abuse in her brother and father; Anemia in her daughter, sister, and sister; Anesthesia problems in her father; Asthma in her brother, mother, and sister; Brain cancer in her sister; Breast cancer (age of onset: 32) in her sister; COPD in her mother and sister; Colon cancer (age of onset: 23) in her father; Diabetes in her brother, father, mother, sister, sister, sister, and sister; Heart disease in her brother, brother, mother, and sister; Hypertension in her brother, daughter, sister, sister, sister, sister, and sister; Kidney disease in her father; Sleep apnea in her brother.  Medical History: Past Medical History:  Diagnosis Date   Anemia, iron deficiency 12/22/2014   pt. denies   Arthritis    ASTHMA 05/12/2009   Severe AFL (Spirometry 05/2009: pre-BD FEV1 0.87L 34% pred, post-BD FEV1  1.11L 44% pred) Volumes hyperinflated Decreased DLCO that does not fully correct to normal range for alveolar volume.      Bipolar disorder (HCC)    with anxiety, depression   COPD (chronic obstructive pulmonary disease) (HCC)    Eczema 05/18/2022   Fibromyalgia 05/14/2014   GERD (gastroesophageal reflux disease)    History of kidney stones    Hyperlipidemia 04/20/2017   HYPERTENSION 05/12/2009   Qualifier: Diagnosis of  By: Truman Hayward Duncan Dull), [name]     Peripheral vascular disease (HCC)    Pneumonia    Prediabetes 02/23/2014   pt. denies   Seizure (HCC)    Stroke (HCC) 11/2020   Urticaria     Surgical History: Past Surgical History:  Procedure Laterality Date   ABDOMINAL HYSTERECTOMY     ANTERIOR CERVICAL DECOMP/DISCECTOMY FUSION N/A 07/28/2020   Procedure: ANTERIOR CERVICAL DECOMPRESSION/DISCECTOMY FUSION. INTERBODY PROTHESIS, PLATE/SCREWS CERVICAL THREE- CERVICAL FOUR, CERVICAL FOUR- CERVICAL FIVE;  Surgeon: Tressie Stalker, MD;  Location[facility] OR;  Service: Neurosurgery;  Laterality: N/A;  BACK SURGERY     COLONOSCOPY  12/20/2011   Procedure: COLONOSCOPY;  Surgeon: Freddy Jaksch, MD;  Location: [facility] ENDOSCOPY;  Service: Endoscopy;  Laterality: N/A;   COLONOSCOPY  03/05/2012   Procedure: COLONOSCOPY;  Surgeon: Freddy Jaksch, MD;  Location: [facility] ENDOSCOPY;  Service: Endoscopy;  Laterality: N/A;   DIAGNOSTIC LAPAROSCOPY     HEMORRHOID SURGERY     INCISE AND DRAIN ABCESS     KIDNEY STONE SURGERY     NECK SURGERY     x 2 Dr Terrilee Files   SPINE SURGERY  2019   TOE SURGERY     TUBAL LIGATION      Medications:   Current Facility-Administered Medications:    acetaminophen (TYLENOL) tablet 650 mg, 650 mg, Oral, Q6H PRN **OR** acetaminophen (TYLENOL) suppository 650 mg, 650 mg, Rectal, Q6H PRN, Howerter, Justin B, DO   albuterol (PROVENTIL) (2.5 MG/3ML) 0.083% nebulizer solution 2.5 mg, 2.5 mg, Nebulization, Q4H PRN, Howerter, Justin B, DO, 2.5 mg at 08/17/23 0449   ARIPiprazole  (ABILIFY) tablet 20 mg, 20 mg, Oral, Daily, [name], MD, 20 mg at 08/17/23 4098   carbamazepine (TEGRETOL) tablet 400 mg, 400 mg, Oral, Daily, 400 mg at 08/17/23 1191 **AND** carbamazepine (TEGRETOL) tablet 600 mg, 600 mg, Oral, QHS, Howerter, Justin B, DO, 600 mg at 08/16/23 2213   enoxaparin (LOVENOX) injection 40 mg, 40 mg, Subcutaneous, Daily, Danford, Earl Lites, MD, 40 mg at 08/17/23 0924   famotidine (PEPCID) tablet 40 mg, 40 mg, Oral, QHS, Howerter, Justin B, DO, 40 mg at 08/16/23 2205   ferrous sulfate tablet 325 mg, 325 mg, Oral, Q breakfast, Howerter, Justin B, DO, 325 mg at 08/17/23 0925   fluticasone (FLONASE) 50 MCG/ACT nasal spray 1 spray, 1 spray, Each Nare, BID PRN, Danford, Earl Lites, MD   insulin aspart (novoLOG) injection 0-9 Units, 0-9 Units, Subcutaneous, TID WC, Howerter, Justin B, DO, 2 Units at 08/17/23 1241   ipratropium-albuterol (DUONEB) 0.5-2.5 (3) MG/3ML nebulizer solution 3 mL, 3 mL, Nebulization, BID, Danford, Earl Lites, MD, 3 mL at 08/17/23 0824   labetalol (NORMODYNE) injection 10 mg, 10 mg, Intravenous, Q2H PRN, Adhikari, Amrit, MD   lactulose (CHRONULAC) 10 GM/15ML solution 10 g, 10 g, Oral, TID, Adhikari, Amrit, MD, 10 g at 08/16/23 2205   lithium carbonate capsule 150 mg, 150 mg, Oral, BID WC, [name], MD, 150 mg at 08/17/23 4782   loratadine (CLARITIN) tablet 10 mg, 10 mg, Oral, Daily, Danford, Earl Lites, MD, 10 mg at 08/17/23 0925   mometasone-formoterol (DULERA) 200-5 MCG/ACT inhaler 2 puff, 2 puff, Inhalation, BID, Danford, Earl Lites, MD, 2 puff at 08/17/23 0825   montelukast (SINGULAIR) tablet 10 mg, 10 mg, Oral, QHS, Howerter, Justin B, DO, 10 mg at 08/16/23 2205   ondansetron (ZOFRAN) injection 4 mg, 4 mg, Intravenous, Q6H PRN, Howerter, Justin B, DO, 4 mg at 08/16/23 0954   Oral care mouth rinse, 15 mL, Mouth Rinse, PRN, Danford, Earl Lites, MD   pantoprazole (PROTONIX) EC tablet 40 mg,  40 mg, Oral, BID, Howerter, Justin B, DO, 40 mg at 08/17/23 0926   polyethylene glycol (MIRALAX / GLYCOLAX) packet 17 g, 17 g, Oral, Daily, Adhikari, Amrit, MD, 17 g at 08/16/23 0811   senna (SENOKOT) tablet 8.6 mg, 1 tablet, Oral, BID, Adhikari, Amrit, MD, 8.6 mg at 08/17/23 9562  Allergies: Allergies  Allergen Reactions   Sulfa Antibiotics Nausea And Vomiting and Other (See Comments)   Venofer [Iron Sucrose] Hives

## 2023-08-18 ENCOUNTER — Encounter: Payer: Self-pay | Admitting: Internal Medicine

## 2023-08-18 DIAGNOSIS — U071 COVID-19: Secondary | ICD-10-CM | POA: Diagnosis not present

## 2023-08-18 DIAGNOSIS — J441 Chronic obstructive pulmonary disease with (acute) exacerbation: Secondary | ICD-10-CM | POA: Diagnosis not present

## 2023-08-18 DIAGNOSIS — F319 Bipolar disorder, unspecified: Secondary | ICD-10-CM | POA: Diagnosis not present

## 2023-08-18 LAB — GLUCOSE, CAPILLARY
Glucose-Capillary: 178 mg/dL — ABNORMAL HIGH (ref 70–99)
Glucose-Capillary: 127 mg/dL — ABNORMAL HIGH (ref 70–99)
Glucose-Capillary: 162 mg/dL — ABNORMAL HIGH (ref 70–99)
Glucose-Capillary: 159 mg/dL — ABNORMAL HIGH (ref 70–99)

## 2023-08-18 NOTE — Plan of Care (Signed)
  Problem: Education: Goal: Knowledge of risk factors and measures for prevention of condition will improve Outcome: Progressing   Problem: Coping: Goal: Psychosocial and spiritual needs will be supported Outcome: Progressing   Problem: Respiratory: Goal: Will maintain a patent airway Outcome: Progressing Goal: Complications related to the disease process, condition or treatment will be avoided or minimized Outcome: Progressing   Problem: Education: Goal: Ability to describe self-care measures that may prevent or decrease complications (Diabetes Survival Skills Education) will improve Outcome: Progressing Goal: Individualized Educational Video(s) Outcome: Progressing   Problem: Coping: Goal: Ability to adjust to condition or change in health will improve Outcome: Progressing   Problem: Fluid Volume: Goal: Ability to maintain a balanced intake and output will improve Outcome: Progressing   Problem: Health Behavior/Discharge Planning: Goal: Ability to identify and utilize available resources and services will improve Outcome: Progressing Goal: Ability to manage health-related needs will improve Outcome: Progressing   Problem: Metabolic: Goal: Ability to maintain appropriate glucose levels will improve Outcome: Progressing   Problem: Nutritional: Goal: Maintenance of adequate nutrition will improve Outcome: Progressing Goal: Progress toward achieving an optimal weight will improve Outcome: Progressing   Problem: Skin Integrity: Goal: Risk for impaired skin integrity will decrease Outcome: Progressing   Problem: Tissue Perfusion: Goal: Adequacy of tissue perfusion will improve Outcome: Progressing   Problem: Education: Goal: Knowledge of General Education information will improve Description: Including pain rating scale, medication(s)/side effects and non-pharmacologic comfort measures Outcome: Progressing   Problem: Health Behavior/Discharge Planning: Goal:  Ability to manage health-related needs will improve Outcome: Progressing   Problem: Clinical Measurements: Goal: Ability to maintain clinical measurements within normal limits will improve Outcome: Progressing Goal: Will remain free from infection Outcome: Progressing Goal: Diagnostic test results will improve Outcome: Progressing Goal: Respiratory complications will improve Outcome: Progressing Goal: Cardiovascular complication will be avoided Outcome: Progressing   Problem: Coping: Goal: Level of anxiety will decrease Outcome: Progressing   Problem: Activity: Goal: Risk for activity intolerance will decrease Outcome: Progressing   Problem: Nutrition: Goal: Adequate nutrition will be maintained Outcome: Progressing   Problem: Pain Managment: Goal: General experience of comfort will improve Outcome: Progressing   Problem: Safety: Goal: Ability to remain free from injury will improve Outcome: Progressing   Problem: Skin Integrity: Goal: Risk for impaired skin integrity will decrease Outcome: Progressing

## 2023-08-18 NOTE — Consult Note (Signed)
[name] Psychiatry Consult Evaluation  Service Date: August 18, 2023 LOS:  LOS: 6 days    Primary Psychiatric Diagnoses  Acute encephalopathy, unclear etiology 2.  Steroid-induced psychosis? 3.  Bipolar 1 disorder  Assessment  [name] is a 62 y.o. female admitted medically on 08/11/2023  9:22 PM for COVID-19 and COPD exacerbation. She carries the psychiatric diagnoses of bipolar 1 disorder and intermittent explosive disorder. She has a past medical history of  COPD, anemia, prediabetes, Seizures, Stroke, Eczema, fibromyalgia, hyperlipidemia, and hypertension. Psychiatry was consulted for altered mental status by Burnadette Pop, MD  on 8/29.   Her current presentation of altered mental status is most consistent with steroid-induced psychosis. She meets criteria for bipolar disorder based on past history, patient interview.  Current outpatient psychotropic medications include Abilify 10 mg daily and historically she has had a good response to these medications. She was compliant with medications prior to admission as evidenced by fill history. On initial examination, patient was cooperative, but tearful and fearful of her medical condition and reported "feeling drunk".   Increasing antipsychotic and adding on lithium to assist with mood stability and COVID-19.   Please see plan below for detailed recommendations.   Diagnoses:  Active Hospital problems: Principal Problem:   COVID-19 virus infection Active Problems:   Headache(784.0)   Peripheral vascular disease (HCC)   Chronic iron deficiency anemia   Bipolar disorder (HCC)   Seizure disorder (HCC)   Hypophosphatemia   Essential hypertension   Cirrhosis (HCC)   Acute exacerbation of chronic obstructive pulmonary disease (COPD) (HCC)   Hypokalemia   Obesity (BMI 30-39.9)   OSA (obstructive sleep apnea)   Transaminitis   Steroid-induced psychosis, with delusions (HCC)    Plan   ## Psychiatric  Medication Recommendations:  -- Increase Aripiprazole 10 mg daily to 30 mg daily, starting 8/31, lasting for approximately 2 weeks or until symptoms subside       -- Supplemental dose 10 mg ordered on 8/30 at 11 am -- If possible, avoid further steroids during her hospital course. -- Start Lithium 150 mg Q12       --Lithium has been shown to assist with mood stabilization AND improve clinical course of patients with COVID-19: [email or web address]  ## Medical Decision Making Capacity:   Capacity was not formally addressed during this encounter; however, the patient appeared to understand and participate in the discussion about their treatment plan.   ## Further Work-up:  -- Per primary While pt on Qtc prolonging medications, please monitor & replete K+ to 4 and Mg2+ to 2 -- Re-ordered a follow-on EKG for 8/30 (and every few days while on an increased dose of aripiprazole)  -- most recent EKG on 8/30 had QtC of 451 -- Pertinent labwork reviewed earlier this admission includes: CBC, CMP. Notable for hypokalemia  ## Disposition:  -- There are no current psychiatric contraindications to discharge at this time  ## Behavioral / Environmental:  --   No specific recommendations at this time.  or Utilize compassion and acknowledge the patient's experiences while setting clear and realistic expectations for care.  ## Safety and Observation Level:  - Based on my clinical evaluation, I estimate the patient to be at increased unintentional risk of self harm in the current setting - At this time, we recommend a standard level of observation. This decision is based on my review of the chart including patient's history and current presentation, interview of the patient, mental status examination, and consideration of suicide risk  including evaluating suicidal ideation, plan, intent, suicidal or self-harm behaviors, risk factors, and protective factors. This judgment is based on our  ability to directly address suicide risk, implement suicide prevention strategies and develop a safety plan while the patient is in the clinical setting. Please contact our team if there is a concern that risk level has changed.  Suicide risk assessment  Patient has following modifiable risk factors for suicide: recklessness, which we are addressing by modifying her medications, removing steroids, and recommending delirium precautions .   Patient has following non-modifiable or demographic risk factors for suicide: separation or divorce and psychiatric hospitalization  Patient has the following protective factors against suicide: Supportive family and Cultural, spiritual, or religious beliefs that discourage suicide   Thank you for this consult request. Recommendations have been communicated to the primary team.  We will follow at this time.   Thedore Mins, MD  Psychiatric and Social History   Relevant Aspects of Hospital Course:  Patient is a 62 year old female with history of asthma/COPD with FEV1 of 2023 on Breztri and Dupixent, chronic urticaria, OSA on OSA nightly sleep CPAP, bipolar disorder, obesity, hypertension, seizure disorder, vocal cord paralysis, fibromyalgia and disability who presented with shortness of breath, nonproductive cough.  Admitted on 08/11/2023 for  COVID screening test came out to be positive.  Chest x-ray did not show any pneumonia.  Hospital course remarkable for persistent dyspnea despite being on room air.  She became confused, psychotic since 8/28.  CT head/MRI of the brain negative for acute findings.  Psychiatry consulted 8/28, Steroid discontinued 8/28.   Progress note: 08/18/23 Patient seen this morning in her hospital room. She seems to be making slow but steady mental improvement. She is alert, awake, cooperative and oriented to place, person but not time. There is evidence of favorable response to current treatment due to decreased anxiety, visual  hallucinations and improved cognition. Overall, she denies paranoia, delusions, auditory hallucinations and homicidal thoughts.  Progress Note: 08/17/23 Patient seen face to face in her hospital room. She states that she is still having body pain but has improved mentally. Patient declared  that she is not crazy but often has visual hallucinations of her parent whenever she is in too much pain or stressed out. She states: '' my parents are my guardian angel, they always appear to me whenever I am in distress, they do not want me to suffer and I am grateful to them.'' Today, patient denies delusions, auditory hallucinations, depression and states that she is less anxious because she is getting better.  Patient Report:  8/30:  Pt was in distress when I entered the room. She was naked and crying out for nursing to help her. She complained of "still feeling drunk" and was complaining that there was a man in the room besides me. Confirmed there was no one there. She was oriented to person and place. Not to day/month/year.   Asked her what she needed, she said she needed help going to the bathroom. Requested tech to assist me moving her to the bedside commode. Tech arrived, began to give her bedpan instead, then pt began to gag. Pt then vomited into bucket. Nurse arrived moments later.  Pt continued to have trouble remembering who I and the nurse were. She was aware of her children's names and remained firmly believing that everyone was mad at her. Re-oriented her and told her I had spoken to one of her children yesterday, reassured her that no one was mad at  her and that her hospitalization was not her fault.  Overnight events: Per nursing, acute confusion/psychosis around 5 AM on 8/30, pt was thrashing around. Could be delirium or psychosis. Sounded similar to confusion episode on 8/28. Similar to what I witnessed around 10 am.  Psychiatric ROS 8/30 Mood Symptoms Pt states that she is feeling down very  sad and scared. Further ROS not completed due to her tearful state.  Manic Symptoms Pt reports that this is not like her usual manic episodes. She "feels drunk" and not like herself right now.   Anxiety Symptoms Still very anxious and in distress.  Trauma Symptoms Pt denied trauma symptoms.  Psychosis Symptoms  hallucinations (Patient saw another man in the room (not me), asked why he was there); disorganized thinking (inferred from disorganized speech); grossly disorganized or abnormal motor behavior (disordered movements, arm flailing); continuing to undress / re-dress.   Collateral information: With patient permission, called: Contacted Ryan/Sierra Ledon Snare (daughter - [phone]) at 18 on 8/29 - No answer, no voicemail box set up. Contacted Lurena Joiner  (sister- [phone]) at 32 on 8/29 - No answer, no voicemail box set up. Contacted Katrine Beye (son- [phone]) at [phone] on 8/29 - No answer, no voicemail box set up. Contacted Barnie Alderman (daughter - [phone]) at 25 on 8/29 - No answer, no voicemail box set up.  8/29: Got call back from Ermalinda Memos, at 15:38. She has been medically unstable for the last year or so. Frequent hospitalizations. Says patient is normally self-sufficient and lives on her own. She has a history of "some kind of mood problem", but is unaware of a psych diagnosis (did not share). Talks to his mother every other day, she sounded okay to him two days ago, but she was upset about having to stay in the hospital longer to manage her COPD/COVID. He reported her sister might be a good resource.   Psychiatric History:  Information collected from chart review  Prev Dx/Sx: Bipolar 1 Disorder Current Psych Provider: Kendrick Fries NP Current Meds: aripiprazole 10 mg daily Previous Med Trials: citalopram, fluoxetine, hydroxyzine, doxepin, lorazepam, midazolam, quetiapine, suvorexant, tizandine, trazodone,  varenicline, venlafaxine, ambien, cyclobenzaprine, carbemazepine Therapy: Pt reports therapy with good effect.  Prior ECT: denies Prior Psych Hospitalization: Reports yes, cannot remember when or where  Prior Self Harm: Did not assess at this time. Prior Violence: Did not assess at this time.  Family Psych History: Pt denies, son denies Family Hx suicide: Son denies  Social History:  Developmental Hx: Grew up in [place] Educational Hx: Did not assess at this time. 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 Living Situation: Lives alone. Confirmed. Spiritual Hx: Did not assess at this time. Access to weapons: Will assess prior to D/C  Tobacco use: Former smoker Alcohol use: Did not assess at this time. Drug use: Did not assess at this time.   Exam Findings  Psychiatric Specialty Exam:  Presentation  General Appearance: -- (Naked, threw off gown when offered. Not hypersexual.)  Eye Contact:Good  Speech: slow but clear  Speech Volume:normal   Handedness:No data recorded  Mood and Affect  Mood:dysphoric Affect:mood congruent  Thought Process  Thought Processes:logical  Descriptions of Associations:intact  Orientation:Partial  Thought Content:Loose  History of Schizophrenia/Schizoaffective disorder:No  Duration of Psychotic Symptoms:Less than six months  Hallucinations:Hallucinations: Visual Description of Visual Hallucinations: Pt reported seeing her parents  in room aside from interviewer.  Ideas of Reference:No data  recorded Suicidal Thoughts: denies by pt  Homicidal Thoughts: denies by pt   Sensorium  Memory:Immediate Poor; Recent Fair; Remote Fair  Judgment:Impaired  Insight:Shallow   Executive Functions  Concentration: fairr  Attention Span:Fair  Recall:fair Fund of Knowledge:Fair  Language:Good   Psychomotor Activity  Psychomotor Activity:No data recorded  Assets   Assets:Desire for Improvement; Manufacturing systems engineer; Social Support; Housing  Sleep  Sleep:Sleep: fair    Physical Exam: Vital signs:  Temp:  [97.8 F (36.6 C)-98.5 F (36.9 C)] 98.1 F (36.7 C) (09/01 0445) Pulse Rate:  [94-106] 105 (09/01 0816) Resp:  [18-23] 18 (09/01 0819) BP: (106-123)/(57-89) 123/89 (09/01 0816) SpO2:  [95 %-97 %] 96 % (09/01 0819) FiO2 (%):  [21 %] 21 % (08/31 2300) Weight:  [79.3 kg] 79.3 kg (09/01 0445) Physical Exam Vitals and nursing note reviewed.  Constitutional:      Appearance: She is well-developed. She is obese. She is ill-appearing.  HENT:     Head: Normocephalic and atraumatic.  Pulmonary:     Effort: Pulmonary effort is normal.  Abdominal:     Comments: Actively vomiting while in the room with her.  Skin:    General: Skin is warm and dry.  Neurological:     Mental Status: She is alert.     Comments: Awake, oriented to place and person but not to time  Psychiatric:        Attention and Perception: She perceives visual hallucinations.        Behavior: Behavior is cooperative.        Thought Content: Thought content is not paranoid. Thought content does not include homicidal or suicidal plan.        Cognition and Memory: Cognition is impaired.     Blood pressure 123/89, pulse (!) 105, temperature 98.1 F (36.7 C), temperature source Oral, resp. rate 18, height 5\' 3"  (1.6 m), weight 79.3 kg, SpO2 96%. Body mass index is 30.98 kg/m.   Other History   These have been pulled in through the EMR, reviewed, and updated if appropriate.   Family History:   The patient's family history includes Alcohol abuse in her brother and father; Anemia in her daughter, sister, and sister; Anesthesia problems in her father; Asthma in her brother, mother, and sister; Brain cancer in her sister; Breast cancer (age of onset: 61) in her sister; COPD in her mother and sister; Colon cancer (age of onset: 9) in her father; Diabetes in her brother, father,  mother, sister, sister, sister, and sister; Heart disease in her brother, brother, mother, and sister; Hypertension in her brother, daughter, sister, sister, sister, sister, and sister; Kidney disease in her father; Sleep apnea in her brother.  Medical History: Past Medical History:  Diagnosis Date   Anemia, iron deficiency 12/22/2014   pt. 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      Bipolar disorder (HCC)    with anxiety, depression   COPD (chronic obstructive pulmonary disease) (HCC)    Eczema 05/18/2022   Fibromyalgia 05/14/2014   GERD (gastroesophageal reflux disease)    History of kidney stones    Hyperlipidemia 04/20/2017   HYPERTENSION 05/12/2009   Qualifier: Diagnosis of  By: Truman Hayward Duncan Dull), [name]     Peripheral vascular disease (HCC)    Pneumonia    Prediabetes 02/23/2014  pt. denies   Seizure [facility])    Stroke (HCC) 11/2020   Urticaria     Surgical History: Past Surgical History:  Procedure Laterality Date   ABDOMINAL HYSTERECTOMY     ANTERIOR CERVICAL DECOMP/DISCECTOMY FUSION N/A 07/28/2020   Procedure: ANTERIOR CERVICAL DECOMPRESSION/DISCECTOMY FUSION. INTERBODY PROTHESIS, PLATE/SCREWS CERVICAL THREE- CERVICAL FOUR, CERVICAL FOUR- CERVICAL FIVE;  Surgeon: Tressie Stalker, MD;  Location: [facility] OR;  Service: Neurosurgery;  Laterality: N/A;   BACK SURGERY     COLONOSCOPY  12/20/2011   Procedure: COLONOSCOPY;  Surgeon: Freddy Jaksch, MD;  Location: [facility] ENDOSCOPY;  Service: Endoscopy;  Laterality: N/A;   COLONOSCOPY  03/05/2012   Procedure: COLONOSCOPY;  Surgeon: Freddy Jaksch, MD;  Location: [facility] ENDOSCOPY;  Service: Endoscopy;  Laterality: N/A;   DIAGNOSTIC LAPAROSCOPY     HEMORRHOID SURGERY     INCISE AND DRAIN ABCESS     KIDNEY STONE SURGERY     NECK SURGERY     x 2 Dr Terrilee Files   SPINE SURGERY  2019   TOE SURGERY     TUBAL LIGATION      Medications:   Current Facility-Administered Medications:    acetaminophen (TYLENOL) tablet 650 mg, 650 mg, Oral, Q6H PRN **OR** acetaminophen (TYLENOL) suppository 650 mg, 650 mg, Rectal, Q6H PRN, Howerter, Justin B, DO   albuterol (PROVENTIL) (2.5 MG/3ML) 0.083% nebulizer solution 2.5 mg, 2.5 mg, Nebulization, Q4H PRN, Howerter, Justin B, DO, 2.5 mg at 08/17/23 0449   ARIPiprazole (ABILIFY) tablet 20 mg, 20 mg, Oral, Daily, [name], MD, 20 mg at 08/18/23 0809   carbamazepine (TEGRETOL) tablet 400 mg, 400 mg, Oral, Daily, 400 mg at 08/18/23 0809 **AND** carbamazepine (TEGRETOL) tablet 600 mg, 600 mg, Oral, QHS, Howerter, Justin B, DO, 600 mg at 08/16/23 2213   enoxaparin (LOVENOX) injection 40 mg, 40 mg, Subcutaneous, Daily, Danford, Earl Lites, MD, 40 mg at 08/18/23 0807   famotidine (PEPCID) tablet 40 mg, 40 mg, Oral, QHS, Howerter, Justin B, DO, 40 mg at 08/17/23 2234   ferrous sulfate tablet 325 mg, 325 mg, Oral, Q breakfast, Howerter, Justin B, DO, 325 mg at 08/18/23 0810   fluticasone (FLONASE) 50 MCG/ACT nasal spray 1 spray, 1 spray, Each Nare, BID PRN, Danford, Earl Lites, MD   insulin aspart (novoLOG) injection 0-9 Units, 0-9 Units, Subcutaneous, TID WC, Howerter, Justin B, DO, 2 Units at 08/18/23 0808   ipratropium-albuterol (DUONEB) 0.5-2.5 (3) MG/3ML nebulizer solution 3 mL, 3 mL, Nebulization, BID, Danford, Earl Lites, MD, 3 mL at 08/18/23 0819   labetalol (NORMODYNE) injection 10 mg, 10 mg, Intravenous, Q2H PRN, Adhikari, Amrit, MD   lactulose (CHRONULAC) 10 GM/15ML solution 10 g, 10 g, Oral, TID, Adhikari, Amrit, MD, 10 g at 08/16/23 2205   lithium carbonate capsule 150 mg, 150 mg, Oral, BID WC, Wise, Byrd Hesselbach, MD, 150 mg at 08/18/23 0809   loratadine (CLARITIN) tablet 10 mg, 10 mg, Oral, Daily, Danford, Earl Lites, MD, 10 mg at 08/18/23 0809    mometasone-formoterol (DULERA) 200-5 MCG/ACT inhaler 2 puff, 2 puff, Inhalation, BID, Danford, Earl Lites, MD, 2 puff at 08/18/23 0819   montelukast (SINGULAIR) tablet 10 mg, 10 mg, Oral, QHS, Howerter, Justin B, DO, 10 mg at 08/17/23 2234   ondansetron (ZOFRAN) injection 4 mg, 4 mg, Intravenous, Q6H PRN, Howerter, Justin B, DO, 4 mg at 08/16/23 0954   Oral care mouth rinse, 15 mL, Mouth Rinse, PRN, Danford, Earl Lites, MD   pantoprazole (PROTONIX) EC tablet 40 mg,  40 mg, Oral, BID, Howerter, Justin B, DO, 40 mg at 08/18/23 0809   polyethylene glycol (MIRALAX / GLYCOLAX) packet 17 g, 17 g, Oral, Daily, Adhikari, Amrit, MD, 17 g at 08/16/23 0811   senna (SENOKOT) tablet 8.6 mg, 1 tablet, Oral, BID, Adhikari, Amrit, MD, 8.6 mg at 08/17/23 1610  Allergies: Allergies  Allergen Reactions   Sulfa Antibiotics Nausea And Vomiting and Other (See Comments)   Venofer [Iron Sucrose] Hives

## 2023-08-18 NOTE — Progress Notes (Addendum)
[facility]  PROGRESS NOTE  [name] EGB:[id number] DOB: 1961-05-30 DOA: 08/11/2023 PCP: Corwin Levins, MD   Brief HPI:   62 year old female with history of asthma/COPD with FEV1 of 2023 on Breztri and Dupixent, chronic urticaria, OSA on OSA nightly sleep CPAP, bipolar disorder, obesity, hypertension, seizure disorder, vocal cord paralysis, fibromyalgia and disability who presented with shortness of breath, nonproductive cough.  On presentation, COVID screening test came out to be positive.  Chest x-ray did not show any pneumonia.  Hospital course remarkable for persistent dyspnea despite being on room air.  She became confused, psychotic since 8/28.  CT head/MRI of the brain negative for acute findings.  Psychiatry consulted and following.  Adjusting medications     Assessment/Plan:   Acute exacerbation of COPD: This is secondary to COVID infection.  Chest x-ray did not show any pneumonia.  Presented with shortness of breath, cough. She was on  Solu-Medrol, azithromycin, bronchodilators, Zyrtec, Flonase, Singulair.  Continue intensive spirometry, flutter valve.  Continue PPI.She  remained on room air.  Steroids discontinued due to concern for psychosis.  No wheezing today   COVID infection: Diagnosed about 5 days ago before admission.  Outside window for Paxlovid.  No infiltrates or hypoxia.  Continue supportive care.   Altered mentation /psychosis/history of bipolar disorder: She was found to be confused on 8/28.  Rapid response was called.She does not have any focal deficits.  She was complaining of being drowsy/drunk.  She was on a scheduled hydroxyzine which has been discontinued.  CT head was done which did not show any acute findings.  MRI of the brain did not show any acute findings.  Mildly elevated ammonia which may not have contributed to confusion.  Steroid discontinued due to concern for steroid induced psychosis.  Psychiatry consulted and following .increased the  dose of aripiprazole, starting lithium.  Tegretol will be to be continued   Leukocytosis: This is most likely reactive secondary to steroids.  Continue to monitor .   Elevated liver enzymes: In the setting of COVID.  Continue to monitor.  Statin on hold   OSA: Continue CPAP at night.   Obesity: BMI of 32   Hypokalemia/hypophosphatemia: Being monitored and supplemented as needed   Cirrhosis: Well compensated.  No ascites   Hypertension: Takes thiazide, ARB  at home.  Blood pressure was soft overnight and these medical since discontinued   Seizure disorder: On Tegretol.   Chronic iron deficiency  anemia: Current hemoglobin stable.   Peripheral vascular disease: Takes statin at home.  Currently on hold due to elevated LFT   Constipation: Continue bowel regimen     PT recommended rehab consult   Medications     ARIPiprazole  20 mg Oral Daily   carbamazepine  400 mg Oral Daily   And   carbamazepine  600 mg Oral QHS   enoxaparin (LOVENOX) injection  40 mg Subcutaneous Daily   famotidine  40 mg Oral QHS   ferrous sulfate  325 mg Oral Q breakfast   insulin aspart  0-9 Units Subcutaneous TID WC   ipratropium-albuterol  3 mL Nebulization BID   lactulose  10 g Oral TID   lithium carbonate  150 mg Oral BID WC   loratadine  10 mg Oral Daily   mometasone-formoterol  2 puff Inhalation BID   montelukast  10 mg Oral QHS   pantoprazole  40 mg Oral BID   polyethylene glycol  17 g Oral Daily   senna  1 tablet Oral  BID     Data Reviewed:   CBG:  Recent Labs  Lab 08/16/23 2140 08/17/23 0618 08/17/23 1108 08/17/23 2129 08/18/23 0640  GLUCAP 140* 128* 175* 170* 178*    SpO2: 96 % O2 Flow Rate (L/min): 2 L/min FiO2 (%): 21 %    Vitals:   08/18/23 0056 08/18/23 0445 08/18/23 0816 08/18/23 0819  BP: 122/78 113/64 123/89   Pulse: 98 94 (!) 105   Resp: 18 18 18 18   Temp: 98.5 F (36.9 C) 98.1 F (36.7 C)    TempSrc: Axillary Oral    SpO2: 97% 97% 95% 96%  Weight:   79.3 kg    Height:          Data Reviewed:  Basic Metabolic Panel: Recent Labs  Lab 08/12/23 0231 08/12/23 0239 08/13/23 0545 08/13/23 0552 08/14/23 0411 08/15/23 0345 08/16/23 0625 08/17/23 0305  NA 136   < >  --  138 135 136 135 135  K 3.1*   < >  --  3.0* 3.2* 3.0* 3.8 4.1  CL 99  --   --  98 93* 91* 94* 96*  CO2 25  --   --  29 33* 33* 28 25  GLUCOSE 276*  --   --  228* 162* 153* 162* 135*  BUN 14  --   --  13 17 13 19 13   CREATININE 0.90  --   --  0.84 0.81 0.86 0.89 0.81  CALCIUM 8.5*  --   --  9.1 8.5* 8.6* 8.7* 9.1  MG 2.4  --   --   --   --   --   --   --   PHOS 1.9*  --  3.0  --  3.5  --   --   --    < > = values in this interval not displayed.    CBC: Recent Labs  Lab 08/11/23 2130 08/11/23 2242 08/12/23 0231 08/12/23 0239 08/13/23 0552 08/14/23 0411 08/15/23 0345 08/16/23 0625 08/17/23 0305  WBC 13.1*  --  11.8*  --  20.0* 19.5* 17.8* 14.8* 20.7*  NEUTROABS 11.0*  --  10.2*  --   --   --   --   --   --   HGB 10.2*   < > 9.3*   < > 9.8* 9.3* 11.3* 11.7* 11.6*  HCT 33.5*   < > 29.6*   < > 30.6* 29.8* 36.3 37.5 36.8  MCV 85.2  --  86.5  --  84.8 83.0 84.0 83.5 85.2  PLT 512*  --  427*  --  495* 450* 484* 514* 501*   < > = values in this interval not displayed.    LFT Recent Labs  Lab 08/12/23 0231 08/13/23 0552 08/14/23 0411 08/15/23 0345  AST 50* 57* 61* 41  ALT 88* 118* 135* 139*  ALKPHOS 60 57 59 66  BILITOT 0.5 0.1* 0.2* <0.1*  PROT 6.0* 6.3* 5.7* 6.4*  ALBUMIN 3.4* 3.6 3.4* 3.7     Antibiotics: Anti-infectives (From admission, onward)    Start     Dose/Rate Route Frequency Ordered Stop   08/12/23 0145  azithromycin (ZITHROMAX) 500 mg in sodium chloride 0.9 % 250 mL IVPB  Status:  Discontinued       Note to Pharmacy: (In setting of acute copd exac)   500 mg 250 mL/hr over 60 Minutes Intravenous Daily at bedtime 08/12/23 0123 08/15/23 1304        DVT prophylaxis: Lovenox  Code Status:  Full code  Family Communication:     CONSULTS    Subjective   Denies any complaints.  Wants to go home   Objective    Physical Examination:   General-appears in no acute distress Heart-S1-S2, regular, no murmur auscultated Lungs-clear to auscultation bilaterally, no wheezing or crackles auscultated Abdomen-soft, nontender, no organomegaly Extremities-no edema in the lower extremities Neuro-alert, oriented x3, no focal deficit noted   Status is: Inpatient:             Meredeth Ide   [facility] If 7PM-7AM, please contact night-coverage at [email or web address], Office  [phone]   08/18/2023, 11:22 AM  LOS: 6 days

## 2023-08-18 NOTE — Progress Notes (Signed)
   08/18/23 2325  BiPAP/CPAP/SIPAP  $ Non-Invasive Home Ventilator  Subsequent  BiPAP/CPAP/SIPAP Pt Type Adult  BiPAP/CPAP/SIPAP Resmed  Mask Type Full face mask  Mask Size Medium  Respiratory Rate 18 breaths/min  IPAP  (max 12)  EPAP  (min 6)  Pressure Support 6 cmH20  Flow Rate 2 lpm  Patient Home Equipment No  Auto Titrate Yes  BiPAP/CPAP /SiPAP Vitals  Pulse Rate 91  Resp 18  Bilateral Breath Sounds Clear;Diminished  MEWS Score/Color  MEWS Score 0  MEWS Score Color Green

## 2023-08-19 DIAGNOSIS — U071 COVID-19: Secondary | ICD-10-CM | POA: Diagnosis not present

## 2023-08-19 DIAGNOSIS — F319 Bipolar disorder, unspecified: Secondary | ICD-10-CM | POA: Diagnosis not present

## 2023-08-19 DIAGNOSIS — J441 Chronic obstructive pulmonary disease with (acute) exacerbation: Secondary | ICD-10-CM | POA: Diagnosis not present

## 2023-08-19 LAB — GLUCOSE, CAPILLARY
Glucose-Capillary: 135 mg/dL — ABNORMAL HIGH (ref 70–99)
Glucose-Capillary: 96 mg/dL (ref 70–99)
Glucose-Capillary: 130 mg/dL — ABNORMAL HIGH (ref 70–99)

## 2023-08-19 NOTE — Progress Notes (Addendum)
Physical Therapy Treatment Patient Details Name: [name] MRN: [id number] DOB: 1961/05/16 Today's Date: 08/19/2023   History of Present Illness 62 y.o. F admitted 8/25 presenting with COPD flare after failed outpatient management. COVID positive. PMH: asthma/COPD FEV1 28% in 2023 on Breztri and Dupixent, chronic urticaria, OSA on nightly BiPAP, bipolar d/o, obesity, HTN, possible seizures, vocal cord dysfunction and fibromyalgia on disability    PT Comments  Pt was more alert and oriented in today's session. Pt was able to ambulate with CGA and no AD for ~100 ft after ambulating with a RW for 150 ft. Pt's VSS throughout session. Worked on improving LE strength and balance with LE fatigue reported at end of session. Pt feels close to baseline with mobility stating "I'm just a little slower than normal". Recommending d/c home with HHPT to continue working on improving functional mobility. Acute PT to follow.    If plan is discharge home, recommend the following: Help with stairs or ramp for entrance;A little help with walking and/or transfers;A little help with bathing/dressing/bathroom;Assistance with cooking/housework;Assist for transportation   Can travel by private vehicle        Equipment Recommendations  None recommended by PT    Recommendations for Other Services       Precautions / Restrictions Precautions Precautions: Fall Restrictions Weight Bearing Restrictions: No     Mobility  Bed Mobility Overal bed mobility: Independent Bed Mobility: Supine to Sit, Sit to Supine     Supine to sit: Supervision, HOB elevated Sit to supine: Supervision, HOB elevated        Transfers Overall transfer level: Needs assistance Equipment used: Rolling walker (2 wheels) Transfers: Sit to/from Stand (x2 with RW, x2 with no AD) Sit to Stand: Contact guard assist           General transfer comment: started session with RW and progressed to no AD. Pt was steady with  transfers with no AD    Ambulation/Gait Ambulation/Gait assistance: Contact guard assist, Supervision Gait Distance (Feet): 250 Feet (x150 w/ RW, x100 w/ no AD) Assistive device: Rolling walker (2 wheels) Gait Pattern/deviations: Decreased stride length, Shuffle, Narrow base of support Gait velocity: dec     General Gait Details: 1 LOB to the left with no AD, corrected with side step. Likely due to moving too quickly and external distraction. Pt with decreased speed and step length with no AD         Balance Overall balance assessment: Needs assistance Sitting-balance support: Feet supported, No upper extremity supported Sitting balance-Leahy Scale: Good     Standing balance support: During functional activity, No upper extremity supported Standing balance-Leahy Scale: Good Standing balance comment: Pt able to maintain static standing with no AD and supervision             High level balance activites: Side stepping High Level Balance Comments: side stepping with no LOB            Cognition Arousal: Alert Behavior During Therapy: WFL for tasks assessed/performed Overall Cognitive Status: Within Functional Limits for tasks assessed                         Following Commands: Follows multi-step commands consistently                Exercises General Exercises - Lower Extremity Long Arc Quad: AROM, Both, 10 reps Hip Flexion/Marching: AROM, Standing, Both, 10 reps Other Exercises Other Exercises: B side-stepping 8 feet, x2;  B forward weight shifts with no AD x5    General Comments General comments (skin integrity, edema, etc.): VSS      Pertinent Vitals/Pain Pain Assessment Pain Assessment: No/denies pain    Home Living                          Prior Function            PT Goals (current goals can now be found in the care plan section) Progress towards PT goals: Progressing toward goals    Frequency    Min  1X/week       AM-PAC PT "6 Clicks" Mobility   Outcome Measure  Help needed turning from your back to your side while in a flat bed without using bedrails?: None Help needed moving from lying on your back to sitting on the side of a flat bed without using bedrails?: None Help needed moving to and from a bed to a chair (including a wheelchair)?: A Little Help needed standing up from a chair using your arms (e.g., wheelchair or bedside chair)?: A Little Help needed to walk in hospital room?: A Little Help needed climbing 3-5 steps with a railing? : A Little 6 Click Score: 20    End of Session   Activity Tolerance: Patient tolerated treatment well Patient left: in bed;with call bell/phone within reach;with bed alarm set Nurse Communication: Mobility status PT Visit Diagnosis: Unsteadiness on feet (R26.81);History of falling (Z91.81)     Time: 1245-1315 PT Time Calculation (min) (ACUTE ONLY): 30 min  Charges:    $Gait Training: 8-22 mins $Neuromuscular Re-education: 8-22 mins PT General Charges $$ ACUTE PT VISIT: 1 Visit                    Hilton Cork, PT, DPT Secure Chat Preferred  Rehab Office [phone]   Arturo Morton Brion Aliment 08/19/2023, 2:09 PM

## 2023-08-19 NOTE — TOC Transition Note (Signed)
Transition of Care [facility]) - CM/SW Discharge Note   Patient Details  Name: [name] MRN: [id number] Date of Birth: [birth year]  [facility]) CM/SW Contact:  Leone Haven, RN Phone Number: 08/19/2023, 1:46 PM   Clinical Narrative:    Patient is for possible dc today, NCM made referral to [facility] with [facility] for HHPT, he is able to take  the referral.  Soc will begin 24 to 48 hrs post dc.     Final next level of care: Home w Home Health Services Barriers to Discharge: No Barriers Identified   Patient Goals and CMS Choice   Choice offered to / list presented to : Patient  Discharge Placement                         Discharge Plan and Services Additional resources added to the After Visit Summary for   In-house Referral: NA Discharge Planning Services: CM Consult Post Acute Care Choice: NA          DME Arranged: N/A DME Agency: NA       HH Arranged: PT HH Agency: [facility] Date [facility] Agency Contacted: 08/19/23 Time HH Agency Contacted: 1346 Representative spoke with at [facility] Agency: Kandee Keen  Social Determinants of Health (SDOH) Interventions SDOH Screenings   Food Insecurity: No Food Insecurity (08/12/2023)  Housing: Low Risk  (08/12/2023)  Transportation Needs: No Transportation Needs (08/12/2023)  Utilities: Not At Risk (08/12/2023)  Alcohol Screen: Low Risk  (11/26/2022)  Depression (PHQ2-9): Low Risk  (06/18/2023)  Financial Resource Strain: Low Risk  (11/26/2022)  Physical Activity: Sufficiently Active (11/26/2022)  Social Connections: Socially Isolated (11/26/2022)  Stress: No Stress Concern Present (11/26/2022)  Tobacco Use: Medium Risk (08/12/2023)     Readmission Risk Interventions    08/12/2023   12:35 PM 02/27/2023   10:26 AM  Readmission Risk Prevention Plan  Transportation Screening Complete Complete  Medication Review (RN Care Manager) Complete Referral to Pharmacy  PCP or Specialist appointment within  3-5 days of discharge Complete Complete  HRI or Home Care Consult Complete Complete  SW Recovery Care/Counseling Consult -- Complete  Palliative Care Screening Not Applicable Not Applicable  Skilled Nursing Facility Not Applicable Not Applicable

## 2023-08-19 NOTE — Discharge Instructions (Signed)
Please contact one of the following facilities to start medication management and therapy services:   [facility] [address]  [place], [place] [address] [phone]   [facility]  [address] [place], [place] [address] [phone]  [facility]  [address] Vella Raring [place], [place] [address] [phone]  [facility]  [address]  [place], [place] [address] [phone]  [facility]  [address] [place], [place] [address] [phone]  [facility]  [address],  [place], [place] [address] [phone]   If your psychiatric symptoms or suicidal thoughts recur, worsen, or if you have side effects to your psychiatric medications, call your outpatient psychiatric provider, [phone] or go to the nearest emergency department.     Psychiatry and Counseling [facility]- [address] [place], [place] [address] [phone]  [facility]    [facility] [address]    [address] [place], [place] [address]     [place], [place] [address] [phone]      [phone]     [address]    [address] [place] [address]     [place] [address] [phone]      [phone]  Andee Poles, MD     [facility] [address]     [address] [place] [address]     [place] [address] [phone]        [phone]  [facility]rap[facility]    [facility] [address]    [address] [place], [place] [phone]      [phone]  [facility]  [facility] [address]     [address] [place] [address]     [place],  [place] [phone]      [phone]   [facility]    [facility] [address]    [address] [place], [place] [address]     [place], [place] [address] [phone]      [phone]  [facility] [address]     [address] [place], [place] [address]     [place], [place] [address] [phone]      [phone]  [facility]                    [facility] [address] Sonny Masters                                        [place], [place] [address] [place], [place] [address]                                                     [phone]

## 2023-08-19 NOTE — TOC Progression Note (Signed)
Transition of Care [facility]) - Progression Note    Patient Details  Name: [name] MRN: [id number] Date of Birth: 08-Apr-1961  Transition of Care [facility]) CM/SW Contact  Leone Haven, RN Phone Number: 08/19/2023, 12:44 PM  Clinical Narrative:    NCM spoke with patient, awaiting pt eval, she states if she needs HHPT, she would like [facility].     Expected Discharge Plan: Home/Self Care Barriers to Discharge: Continued Medical Work up  Expected Discharge Plan and Services In-house Referral: NA Discharge Planning Services: CM Consult Post Acute Care Choice: NA Living arrangements for the past 2 months: Apartment                 DME Arranged: N/A DME Agency: NA       HH Arranged: NA           Social Determinants of Health (SDOH) Interventions SDOH Screenings   Food Insecurity: No Food Insecurity (08/12/2023)  Housing: Low Risk  (08/12/2023)  Transportation Needs: No Transportation Needs (08/12/2023)  Utilities: Not At Risk (08/12/2023)  Alcohol Screen: Low Risk  (11/26/2022)  Depression (PHQ2-9): Low Risk  (06/18/2023)  Financial Resource Strain: Low Risk  (11/26/2022)  Physical Activity: Sufficiently Active (11/26/2022)  Social Connections: Socially Isolated (11/26/2022)  Stress: No Stress Concern Present (11/26/2022)  Tobacco Use: Medium Risk (08/12/2023)    Readmission Risk Interventions    08/12/2023   12:35 PM 02/27/2023   10:26 AM  Readmission Risk Prevention Plan  Transportation Screening Complete Complete  Medication Review (RN Care Manager) Complete Referral to Pharmacy  PCP or Specialist appointment within 3-5 days of discharge Complete Complete  HRI or Home Care Consult Complete Complete  SW Recovery Care/Counseling Consult -- Complete  Palliative Care Screening Not Applicable Not Applicable  Skilled Nursing Facility Not Applicable Not Applicable

## 2023-08-19 NOTE — Plan of Care (Signed)
  Problem: Education: Goal: Knowledge of risk factors and measures for prevention of condition will improve Outcome: Progressing   Problem: Coping: Goal: Psychosocial and spiritual needs will be supported Outcome: Progressing   Problem: Respiratory: Goal: Will maintain a patent airway Outcome: Progressing Goal: Complications related to the disease process, condition or treatment will be avoided or minimized Outcome: Progressing   Problem: Education: Goal: Ability to describe self-care measures that may prevent or decrease complications (Diabetes Survival Skills Education) will improve Outcome: Progressing Goal: Individualized Educational Video(s) Outcome: Progressing   Problem: Coping: Goal: Ability to adjust to condition or change in health will improve Outcome: Progressing   Problem: Fluid Volume: Goal: Ability to maintain a balanced intake and output will improve Outcome: Progressing   Problem: Health Behavior/Discharge Planning: Goal: Ability to identify and utilize available resources and services will improve Outcome: Progressing Goal: Ability to manage health-related needs will improve Outcome: Progressing   Problem: Metabolic: Goal: Ability to maintain appropriate glucose levels will improve Outcome: Progressing   Problem: Nutritional: Goal: Maintenance of adequate nutrition will improve Outcome: Progressing Goal: Progress toward achieving an optimal weight will improve Outcome: Progressing   Problem: Skin Integrity: Goal: Risk for impaired skin integrity will decrease Outcome: Progressing   Problem: Tissue Perfusion: Goal: Adequacy of tissue perfusion will improve Outcome: Progressing   Problem: Education: Goal: Knowledge of General Education information will improve Description: Including pain rating scale, medication(s)/side effects and non-pharmacologic comfort measures Outcome: Progressing   Problem: Health Behavior/Discharge Planning: Goal:  Ability to manage health-related needs will improve Outcome: Progressing   Problem: Clinical Measurements: Goal: Ability to maintain clinical measurements within normal limits will improve Outcome: Progressing Goal: Will remain free from infection Outcome: Progressing Goal: Diagnostic test results will improve Outcome: Progressing Goal: Respiratory complications will improve Outcome: Progressing Goal: Cardiovascular complication will be avoided Outcome: Progressing   Problem: Activity: Goal: Risk for activity intolerance will decrease Outcome: Progressing   Problem: Nutrition: Goal: Adequate nutrition will be maintained Outcome: Progressing   Problem: Coping: Goal: Level of anxiety will decrease Outcome: Progressing   Problem: Elimination: Goal: Will not experience complications related to bowel motility Outcome: Progressing Goal: Will not experience complications related to urinary retention Outcome: Progressing   Problem: Pain Managment: Goal: General experience of comfort will improve Outcome: Progressing   Problem: Safety: Goal: Ability to remain free from injury will improve Outcome: Progressing   Problem: Skin Integrity: Goal: Risk for impaired skin integrity will decrease Outcome: Progressing   

## 2023-08-19 NOTE — Progress Notes (Signed)
[facility]  PROGRESS NOTE  [name] ZOX:[id number] DOB: Jun 05, 1961 DOA: 08/11/2023 PCP: Corwin Levins, MD   Brief HPI:   62 year old female with history of asthma/COPD with FEV1 of 2023 on Breztri and Dupixent, chronic urticaria, OSA on OSA nightly sleep CPAP, bipolar disorder, obesity, hypertension, seizure disorder, vocal cord paralysis, fibromyalgia and disability who presented with shortness of breath, nonproductive cough.  On presentation, COVID screening test came out to be positive.  Chest x-ray did not show any pneumonia.  Hospital course remarkable for persistent dyspnea despite being on room air.  She became confused, psychotic since 8/28.  CT head/MRI of the brain negative for acute findings.  Psychiatry consulted and following.  Adjusting medications     Assessment/Plan:   Acute exacerbation of COPD: This is secondary to COVID infection.  Chest x-ray did not show any pneumonia.  Presented with shortness of breath, cough. She was on  Solu-Medrol, azithromycin, bronchodilators, Zyrtec, Flonase, Singulair.  Continue intensive spirometry, flutter valve.  Continue PPI.She  remained on room air.  Steroids discontinued due to concern for psychosis.  No wheezing today   COVID infection: Diagnosed about 5 days ago before admission.  Outside window for Paxlovid.  No infiltrates or hypoxia.  Continue supportive care.   Altered mentation /psychosis/history of bipolar disorder: She was found to be confused on 8/28.  Rapid response was called.She does not have any focal deficits.  She was complaining of being drowsy/drunk.  She was on a scheduled hydroxyzine which has been discontinued.  CT head was done which did not show any acute findings.  MRI of the brain did not show any acute findings.  Mildly elevated ammonia which may not have contributed to confusion.  Steroid discontinued due to concern for steroid induced psychosis.  Psychiatry consulted and following .increased the  dose of aripiprazole, lithium discontinued due to tremors.  Tegretol will be to be continued   Leukocytosis: This is most likely reactive secondary to steroids.  Continue to monitor .   Elevated liver enzymes: In the setting of COVID.  Continue to monitor.  Statin on hold   OSA: Continue CPAP at night.   Obesity: BMI of 32   Hypokalemia/hypophosphatemia: Being monitored and supplemented as needed   Cirrhosis: Well compensated.  No ascites   Hypertension: Takes thiazide, ARB  at home.  Blood pressure was soft overnight and these medical since discontinued   Seizure disorder: On Tegretol.   Chronic iron deficiency  anemia: Current hemoglobin stable.   Peripheral vascular disease: Takes statin at home.  Currently on hold due to elevated LFT   Constipation: Continue bowel regimen     PT recommended home health -Plan discharge home in a.m.   Medications     ARIPiprazole  20 mg Oral Daily   carbamazepine  400 mg Oral Daily   And   carbamazepine  600 mg Oral QHS   enoxaparin (LOVENOX) injection  40 mg Subcutaneous Daily   famotidine  40 mg Oral QHS   ferrous sulfate  325 mg Oral Q breakfast   insulin aspart  0-9 Units Subcutaneous TID WC   ipratropium-albuterol  3 mL Nebulization BID   lactulose  10 g Oral TID   lithium carbonate  150 mg Oral BID WC   loratadine  10 mg Oral Daily   mometasone-formoterol  2 puff Inhalation BID   montelukast  10 mg Oral QHS   pantoprazole  40 mg Oral BID   polyethylene glycol  17 g Oral  Daily   senna  1 tablet Oral BID     Data Reviewed:   CBG:  Recent Labs  Lab 08/18/23 1119 08/18/23 1601 08/18/23 2110 08/19/23 0608 08/19/23 1204  GLUCAP 127* 162* 159* 135* 96    SpO2: 97 % O2 Flow Rate (L/min): 2 L/min FiO2 (%): 21 %    Vitals:   08/19/23 0432 08/19/23 0441 08/19/23 0818 08/19/23 1207  BP: 125/75  139/81 (!) 131/96  Pulse: (!) 104 95 94 93  Resp: 18  19 17   Temp: 98.5 F (36.9 C)  98.5 F (36.9 C) 98.8 F (37.1  C)  TempSrc: Oral  Oral Oral  SpO2: 95%  97% 97%  Weight: 79.8 kg     Height:          Data Reviewed:  Basic Metabolic Panel: Recent Labs  Lab 08/13/23 0545 08/13/23 0552 08/14/23 0411 08/15/23 0345 08/16/23 0625 08/17/23 0305  NA  --  138 135 136 135 135  K  --  3.0* 3.2* 3.0* 3.8 4.1  CL  --  98 93* 91* 94* 96*  CO2  --  29 33* 33* 28 25  GLUCOSE  --  228* 162* 153* 162* 135*  BUN  --  13 17 13 19 13   CREATININE  --  0.84 0.81 0.86 0.89 0.81  CALCIUM  --  9.1 8.5* 8.6* 8.7* 9.1  PHOS 3.0  --  3.5  --   --   --     CBC: Recent Labs  Lab 08/13/23 0552 08/14/23 0411 08/15/23 0345 08/16/23 0625 08/17/23 0305  WBC 20.0* 19.5* 17.8* 14.8* 20.7*  HGB 9.8* 9.3* 11.3* 11.7* 11.6*  HCT 30.6* 29.8* 36.3 37.5 36.8  MCV 84.8 83.0 84.0 83.5 85.2  PLT 495* 450* 484* 514* 501*    LFT Recent Labs  Lab 08/13/23 0552 08/14/23 0411 08/15/23 0345  AST 57* 61* 41  ALT 118* 135* 139*  ALKPHOS 57 59 66  BILITOT 0.1* 0.2* <0.1*  PROT 6.3* 5.7* 6.4*  ALBUMIN 3.6 3.4* 3.7     Antibiotics: Anti-infectives (From admission, onward)    Start     Dose/Rate Route Frequency Ordered Stop   08/12/23 0145  azithromycin (ZITHROMAX) 500 mg in sodium chloride 0.9 % 250 mL IVPB  Status:  Discontinued       Note to Pharmacy: (In setting of acute copd exac)   500 mg 250 mL/hr over 60 Minutes Intravenous Daily at bedtime 08/12/23 0123 08/15/23 1304        DVT prophylaxis: Lovenox  Code Status: Full code  Family Communication:    CONSULTS    Subjective   Denies any complaints.  Denies shortness of breath.   Objective    Physical Examination:   General-appears in no acute distress Heart-S1-S2, regular, no murmur auscultated Lungs-clear to auscultation bilaterally, no wheezing or crackles auscultated Abdomen-soft, nontender, no organomegaly Extremities-no edema in the lower extremities Neuro-alert, oriented x3, no focal deficit noted   Status is: Inpatient:              Meredeth Ide   [facility] If 7PM-7AM, please contact night-coverage at [email or web address], Office  [phone]   08/19/2023, 2:42 PM  LOS: 7 days

## 2023-08-19 NOTE — Consult Note (Cosign Needed Addendum)
[name] Psychiatry Consult Note   Service Date: August 19, 2023 LOS:  LOS: 7 days  MRN: [id number] Type of consult:  Followup   Primary Psychiatric Diagnoses  Steroid-induced psychosis Acute encephalopathy of unclear etiology Hx bipolar I disorder  Assessment  [name] is a 62 y.o. female with a past psychiatric history of bipolar I disorder and intermittent explosive disorder and significant PMHx of COPD, stroke, seizures, and HTN . Psychiatry was consulted for acute psychosis by Burnadette Pop, MD.   Patient received steroids in the setting of acute COPD exacerbation and psychotic symptoms subsequently emerged. On evaluation today, patient was denying visual hallucinations which is a notable improvement from what she was reporting to the weekend team - prescribed antipsychotic and avoidance of further steroid use likely helpful in treating the psychotic symptoms.  Her initial presentation of acute psychosis continues to be most consistent with steroid-induced psychosis. Unlikely to be primary psychotic disorder.  Patient reporting undesirable side-effect to lithium and wishes to discontinue. There are no concerns of hypo/manic symptoms at this time. She is fully alert and oriented.  Please see plan below for detailed recommendations.   Diagnoses:  Active Hospital problems: Principal Problem:   COVID-19 virus infection Active Problems:   Headache(784.0)   Peripheral vascular disease (HCC)   Chronic iron deficiency anemia   Bipolar disorder (HCC)   Seizure disorder (HCC)   Hypophosphatemia   Essential hypertension   Cirrhosis (HCC)   Acute exacerbation of chronic obstructive pulmonary disease (COPD) (HCC)   Hypokalemia   Obesity (BMI 30-39.9)   OSA (obstructive sleep apnea)   Transaminitis   Steroid-induced psychosis, with delusions (HCC)    Plan and Recommendations  ## Interventions (medications, psychoeducation, etc):  -- continue  aripiprazole 20 mg daily for 2 weeks for psychotic symptoms and also has mood stabilization effects -- discontinue lithium 150 mg every 12 hours, patient reporting intolerable side-effect of "shakes," no concerns for hypo/manic symptoms at this time -- continue carbamazepine per primary team for seizure prophylaxis, also has mood stabilization effects -- avoid further steroids during hospital course -- recommending outpatient psychiatry follow-up  ## Further Work-up:  -- consider checking carbamazepine level and titrating to therapeutic dose  -- most recent EKG on 8/30 had QtC of 451 -- Pertinent labwork reviewed earlier this admission includes: CBC, CMP, CT/MRI  ## Medical Decision Making Capacity:  -  intact  ## Disposition:  - No indication for inpatient psychiatric admission. Recommending outpatient psychiatry / psychology follow-up. Defer immediate disposition plan to primary team.  ## Behavioral / Environmental:  -- Standard delirium precautions and Fall precautions  ##Legal Status -- Patient voluntary  Thank you for this consult request. Our recommendations are listed above.  We will sign-off at this time  ## Safety and Observation Level:  - Based on my clinical evaluation, I estimate the patient to be at low risk of self harm in the current setting - At this time, we recommend a no additional level of observation. This decision is based on my review of the chart including patient's history and current presentation, interview of the patient, mental status examination, and consideration of suicide risk including evaluating suicidal ideation, plan, intent, suicidal or self-harm behaviors, risk factors, and protective factors. This judgment is based on our ability to directly address suicide risk, implement suicide prevention strategies and develop a safety plan while the patient is in the clinical setting. Please contact our team if there is a concern that risk level has  changed.  [name], MD  Psychiatric and Social History   Relevant Aspects of Hospital Course: 8/25 - admitted for COPD exacerbation in the setting of COVID infection 8/28 - psychiatry consulted for acute psychosis, steroid-induced etiology suspected. Steroids stopped. 8/30 - 9/1 - aripiprazole and lithium started, dosages adjusted. Patient seen to have slow but steady psychiatric improvement  Daily Progress Note (9/2): Patient says she feels a lot better. She confirms she was seeing her parents over the weekend when other people around her could not see them, but she has not seen them today. She reports she has hallucinated before on another medicine (confirms Belsomra) but this has been stopped. While patient's TV was on, I asked her if she ever felt she was receiving special messages from the TV to which she denied.  When I asked patient if the lithium is helping her, she says, "I don't wanna take that - it gives me the shakes." She says she is okay with taking Abilify and Tegretol but adamantly refuses to take lithium. She shares that she used to see Dr. Donell Beers as her psychiatrist in the past but, "I got angry one time and I stopped seeing him." She states she would like to see another psychiatrist "in the same system as this one [facility])." She expresses understanding regarding what psychiatry team thinks was causing her psychotic symptoms.  Patient was able to give me her full name, year, month, and day, as well as the name of hospital. She was able to count backwards from December to January.  Psych ROS: Depression: endorses depressed mood in past but not at present Hypo/mania: denies elevated mood at present Anxiety: denies Psychosis: denies AVH and paranoia  Psychiatric History  Information collected from patient and available chart review  Prev Dx/Sx: Bipolar 1 Disorder Current Psych Provider: denies, used to see Dr. Donell Beers Current Meds: aripiprazole 10 mg daily Previous  Med Trials: citalopram, fluoxetine, hydroxyzine, doxepin, lorazepam, midazolam, quetiapine, suvorexant, tizandine, trazodone, varenicline, venlafaxine, ambien, cyclobenzaprine, carbemazepine Therapy: Pt reports therapy with good effect.   Prior ECT: denies Prior Psych Hospitalization: Reports yes, cannot remember when or where  Prior Self Harm: Did not assess at this time. Prior Violence: Did not assess at this time.   Family Psych History: Pt denies, son denies Family Hx suicide: Son denies   Social History:  Developmental Hx: Grew up in [place]  Educational Hx: some college but no degree Occupational Hx: CNA and Chief Technology Officer Hx: Did not assess at this time. Living Situation: Lives alone in apartment. Spiritual Hx: Did not assess at this time. Access to weapons: denies   Tobacco use: Former smoker, clean for 5 years Alcohol use: denies Drug use: marijuana, stopped 2 years ago.   Other Histories  These are pulled from EMR and updated if appropriate.   Family History:  The patient's family history includes Alcohol abuse in her brother and father; Anemia in her daughter, sister, and sister; Anesthesia problems in her father; Asthma in her brother, mother, and sister; Brain cancer in her sister; Breast cancer (age of onset: 46) in her sister; COPD in her mother and sister; Colon cancer (age of onset: 81) in her father; Diabetes in her brother, father, mother, sister, sister, sister, and sister; Heart disease in her brother, brother, mother, and sister; Hypertension in her brother, daughter, sister, sister, sister, sister, and sister; Kidney disease in her father; Sleep apnea in her brother.  Medical History: Past Medical History:  Diagnosis Date  Anemia, iron deficiency 12/22/2014   pt. denies   Arthritis    ASTHMA 05/12/2009   Severe AFL (Spirometry 05/2009: pre-BD FEV1 0.87L 34% pred, post-BD FEV1 1.11L 44% pred) Volumes hyperinflated Decreased DLCO that does not  fully correct to normal range for alveolar volume.      Bipolar disorder (HCC)    with anxiety, depression   COPD (chronic obstructive pulmonary disease) (HCC)    Eczema 05/18/2022   Fibromyalgia 05/14/2014   GERD (gastroesophageal reflux disease)    History of kidney stones    Hyperlipidemia 04/20/2017   HYPERTENSION 05/12/2009   Qualifier: Diagnosis of  By: Truman Hayward Duncan Dull), [name]     Peripheral vascular disease (HCC)    Pneumonia    Prediabetes 02/23/2014   pt. denies   Seizure (HCC)    Stroke (HCC) 11/2020   Urticaria     Surgical History: Past Surgical History:  Procedure Laterality Date   ABDOMINAL HYSTERECTOMY     ANTERIOR CERVICAL DECOMP/DISCECTOMY FUSION N/A 07/28/2020   Procedure: ANTERIOR CERVICAL DECOMPRESSION/DISCECTOMY FUSION. INTERBODY PROTHESIS, PLATE/SCREWS CERVICAL THREE- CERVICAL FOUR, CERVICAL FOUR- CERVICAL FIVE;  Surgeon: Tressie Stalker, MD;  Location: [facility] OR;  Service: Neurosurgery;  Laterality: N/A;   BACK SURGERY     COLONOSCOPY  12/20/2011   Procedure: COLONOSCOPY;  Surgeon: Freddy Jaksch, MD;  Location: [facility];  Service: Endoscopy;  Laterality: N/A;   COLONOSCOPY  03/05/2012   Procedure: COLONOSCOPY;  Surgeon: Freddy Jaksch, MD;  Location: [facility];  Service: Endoscopy;  Laterality: N/A;   DIAGNOSTIC LAPAROSCOPY     HEMORRHOID SURGERY     INCISE AND DRAIN ABCESS     KIDNEY STONE SURGERY     NECK SURGERY     x 2 Dr Terrilee Files   SPINE SURGERY  2019   TOE SURGERY     TUBAL LIGATION      Medications:   Current Facility-Administered Medications:    acetaminophen (TYLENOL) tablet 650 mg, 650 mg, Oral, Q6H PRN **OR** acetaminophen (TYLENOL) suppository 650 mg, 650 mg, Rectal, Q6H PRN, Howerter, Justin B, DO   albuterol (PROVENTIL) (2.5 MG/3ML) 0.083% nebulizer solution 2.5 mg, 2.5 mg, Nebulization, Q4H PRN, Howerter, Justin B, DO, 2.5 mg at 08/19/23 0443   ARIPiprazole (ABILIFY) tablet 20 mg, 20 mg, Oral, Daily, [name], MD, 20 mg at 08/19/23 9528   carbamazepine (TEGRETOL) tablet 400 mg, 400 mg, Oral, Daily, 400 mg at 08/18/23 0809 **AND** carbamazepine (TEGRETOL) tablet 600 mg, 600 mg, Oral, QHS, Howerter, Justin B, DO, 600 mg at 08/18/23 2058   enoxaparin (LOVENOX) injection 40 mg, 40 mg, Subcutaneous, Daily, Danford, Earl Lites, MD, 40 mg at 08/19/23 0908   famotidine (PEPCID) tablet 40 mg, 40 mg, Oral, QHS, Howerter, Justin B, DO, 40 mg at 08/18/23 2058   ferrous sulfate tablet 325 mg, 325 mg, Oral, Q breakfast, Howerter, Justin B, DO, 325 mg at 08/19/23 0910   fluticasone (FLONASE) 50 MCG/ACT nasal spray 1 spray, 1 spray, Each Nare, BID PRN, Danford, Earl Lites, MD   insulin aspart (novoLOG) injection 0-9 Units, 0-9 Units, Subcutaneous, TID WC, Howerter, Justin B, DO, 1 Units at 08/19/23 0719   ipratropium-albuterol (DUONEB) 0.5-2.5 (3) MG/3ML nebulizer solution 3 mL, 3 mL, Nebulization, BID, Danford, Earl Lites, MD, 3 mL at 08/19/23 0918   labetalol (NORMODYNE) injection 10 mg, 10 mg, Intravenous, Q2H PRN, Adhikari, Amrit, MD   lactulose (CHRONULAC) 10 GM/15ML solution 10 g, 10 g, Oral, TID, Adhikari, Amrit, MD, 10 g at 08/18/23  2059   lithium carbonate capsule 150 mg, 150 mg, Oral, BID WC, [name], MD, 150 mg at 08/19/23 0910   loratadine (CLARITIN) tablet 10 mg, 10 mg, Oral, Daily, Danford, Earl Lites, MD, 10 mg at 08/19/23 0910   mometasone-formoterol (DULERA) 200-5 MCG/ACT inhaler 2 puff, 2 puff, Inhalation, BID, Danford, Earl Lites, MD, 2 puff at 08/19/23 0918   montelukast (SINGULAIR) tablet 10 mg, 10 mg, Oral, QHS, Howerter, Justin B, DO, 10 mg at 08/18/23 2058   ondansetron (ZOFRAN) injection 4 mg, 4 mg, Intravenous, Q6H PRN, Howerter, Justin B, DO, 4 mg at 08/16/23 0981   Oral care mouth rinse, 15 mL, Mouth Rinse, PRN, Danford, Earl Lites, MD   pantoprazole (PROTONIX) EC tablet 40 mg, 40 mg, Oral, BID, Howerter, Justin B, DO, 40 mg at 08/19/23  0910   polyethylene glycol (MIRALAX / GLYCOLAX) packet 17 g, 17 g, Oral, Daily, Adhikari, Amrit, MD, 17 g at 08/16/23 0811   senna (SENOKOT) tablet 8.6 mg, 1 tablet, Oral, BID, Adhikari, Amrit, MD, 8.6 mg at 08/17/23 1914  Allergies: Allergies  Allergen Reactions   Sulfa Antibiotics Nausea And Vomiting and Other (See Comments)   Venofer [Iron Sucrose] Hives    Exam Findings  Vital signs:  Temp:  [98.1 F (36.7 C)-98.8 F (37.1 C)] 98.8 F (37.1 C) (09/02 1207) Pulse Rate:  [89-108] 93 (09/02 1207) Resp:  [17-19] 17 (09/02 1207) BP: (121-139)/(75-96) 131/96 (09/02 1207) SpO2:  [95 %-100 %] 97 % (09/02 1207) FiO2 (%):  [21 %] 21 % (09/01 1856) Weight:  [79.8 kg] 79.8 kg (09/02 0432)  Psychiatric Specialty Exam:  Presentation  General Appearance: Appropriate for Environment; Casual  Eye Contact:Good  Speech:Normal Rate; Clear and Coherent  Speech Volume:Normal  Handedness:No data recorded  Mood and Affect  Mood:-- ("I feel a lot better")  Affect:Appropriate; Congruent; Full Range   Thought Process  Thought Processes:Linear; Coherent  Descriptions of Associations:Intact  Orientation:Full (Time, Place and Person)  Thought Content:WDL  History of Schizophrenia/Schizoaffective disorder:No  Duration of Psychotic Symptoms:Less than six months  Hallucinations:Hallucinations: None Description of Auditory Hallucinations: none Description of Visual Hallucinations: none  Ideas of Reference:None  Suicidal Thoughts:Suicidal Thoughts: No  Homicidal Thoughts:Homicidal Thoughts: No   Sensorium  Memory:Immediate Good; Recent Fair; Remote Good  Judgment:Fair  Insight:Fair   Executive Functions  Concentration:Good  Attention Span:Good  Recall:Good  Fund of Knowledge:Good  Language:Good   Psychomotor Activity  Psychomotor Activity:Psychomotor Activity: Normal   Assets  Assets:Desire for Improvement; Housing; Resilience   Sleep  Sleep:Sleep:  Good   Physical Exam: Physical Exam Vitals and nursing note reviewed.  HENT:     Head: Normocephalic and atraumatic.  Pulmonary:     Effort: Pulmonary effort is normal.  Neurological:     General: No focal deficit present.     Mental Status: She is alert. Mental status is at baseline.    Blood pressure (!) 131/96, pulse 93, temperature 98.8 F (37.1 C), temperature source Oral, resp. rate 17, height 5\' 3"  (1.6 m), weight 79.8 kg, SpO2 97%. Body mass index is 31.16 kg/m.

## 2023-08-20 ENCOUNTER — Ambulatory Visit (HOSPITAL_COMMUNITY): Payer: Medicare HMO

## 2023-08-20 DIAGNOSIS — K7460 Unspecified cirrhosis of liver: Secondary | ICD-10-CM

## 2023-08-20 DIAGNOSIS — U071 COVID-19: Secondary | ICD-10-CM | POA: Diagnosis not present

## 2023-08-20 DIAGNOSIS — J441 Chronic obstructive pulmonary disease with (acute) exacerbation: Secondary | ICD-10-CM | POA: Diagnosis not present

## 2023-08-20 DIAGNOSIS — F319 Bipolar disorder, unspecified: Secondary | ICD-10-CM | POA: Diagnosis not present

## 2023-08-20 LAB — GLUCOSE, CAPILLARY
Glucose-Capillary: 103 mg/dL — ABNORMAL HIGH (ref 70–99)
Glucose-Capillary: 126 mg/dL — ABNORMAL HIGH (ref 70–99)
Glucose-Capillary: 99 mg/dL (ref 70–99)
Glucose-Capillary: 108 mg/dL — ABNORMAL HIGH (ref 70–99)

## 2023-08-20 MED ORDER — LACTULOSE 10 GM/15ML PO SOLN: 10.0000 g | Freq: Two times a day (BID) | ORAL | 0 refills | Status: DC

## 2023-08-20 MED ORDER — ARIPIPRAZOLE 10 MG PO TABS: 10.0000 mg | ORAL_TABLET | Freq: Every day | ORAL | 1 refills | Status: DC

## 2023-08-20 NOTE — Plan of Care (Signed)
Discharge instructions discussed with patient.  Patient instructed on home medications, restrictions, and follow up appointments. Belongings gathered and sent with patient.  Patients medications snt to [facility]  Patient discharged via wheelchair by this Clinical research associate.

## 2023-08-20 NOTE — Progress Notes (Signed)
Physical Therapy Treatment Patient Details Name: [name] MRN: [id number] DOB: [birth year] Today's Date: 08/20/2023   History of Present Illness 62 y.o. F admitted 8/25 presenting with COPD flare after failed outpatient management. COVID positive. PMH: asthma/COPD FEV1 28% in 2023 on Breztri and Dupixent, chronic urticaria, OSA on nightly BiPAP, bipolar d/o, obesity, HTN, possible seizures, vocal cord dysfunction and fibromyalgia on disability    PT Comments  Patient received in bed, on phone. Reports she is terrible. Wants to go home. States she has been crying all morning due to frustration about still being here. Patient is independent with bed mobility and transfers. She ambulated 200 feet without AD and cga. No lob. Slight unsteadiness. Slow cadence. Mild sob. O2 saturations decreased with mobility to 89% but with rest increased to 95%. She will continue to benefit from skilled PT to improve endurance and safety with mobility.          If plan is discharge home, recommend the following: Help with stairs or ramp for entrance;A little help with walking and/or transfers;A little help with bathing/dressing/bathroom;Assistance with cooking/housework;Assist for transportation   Can travel by private vehicle        Equipment Recommendations  None recommended by PT    Recommendations for Other Services       Precautions / Restrictions Precautions Precautions: Fall Restrictions Weight Bearing Restrictions: No     Mobility  Bed Mobility Overal bed mobility: Independent Bed Mobility: Supine to Sit     Supine to sit: Independent          Transfers Overall transfer level: Independent Equipment used: None Transfers: Sit to/from Stand Sit to Stand: Independent                Ambulation/Gait Ambulation/Gait assistance: Contact guard assist Gait Distance (Feet): 200 Feet Assistive device: None Gait Pattern/deviations: Step-through pattern, Decreased step  length - right, Decreased step length - left Gait velocity: dec     General Gait Details: Patient slightly unsteady but able to ambulate without AD in hallway.   Stairs             Wheelchair Mobility     Tilt Bed    Modified Rankin (Stroke Patients Only)       Balance Overall balance assessment: Needs assistance Sitting-balance support: Feet supported Sitting balance-Leahy Scale: Normal     Standing balance support: No upper extremity supported, During functional activity Standing balance-Leahy Scale: Good Standing balance comment: able to pick object up off floor without A. Ambulated without AD. no lob.                            Cognition Arousal: Alert Behavior During Therapy: WFL for tasks assessed/performed Overall Cognitive Status: Within Functional Limits for tasks assessed                                          Exercises      General Comments        Pertinent Vitals/Pain Pain Assessment Pain Assessment: No/denies pain    Home Living                          Prior Function            PT Goals (current goals can now be found in the care  plan section) Acute Rehab PT Goals Patient Stated Goal: to feel better-go home PT Goal Formulation: With patient Time For Goal Achievement: 08/28/23 Potential to Achieve Goals: Good Progress towards PT goals: Progressing toward goals    Frequency    Min 1X/week      PT Plan      Co-evaluation              AM-PAC PT "6 Clicks" Mobility   Outcome Measure  Help needed turning from your back to your side while in a flat bed without using bedrails?: None Help needed moving from lying on your back to sitting on the side of a flat bed without using bedrails?: None Help needed moving to and from a bed to a chair (including a wheelchair)?: A Little Help needed standing up from a chair using your arms (e.g., wheelchair or bedside chair)?: None Help needed  to walk in hospital room?: A Little Help needed climbing 3-5 steps with a railing? : A Little 6 Click Score: 21    End of Session   Activity Tolerance: Patient tolerated treatment well Patient left: in chair;with chair alarm set;with call bell/phone within reach Nurse Communication: Mobility status PT Visit Diagnosis: Unsteadiness on feet (R26.81);History of falling (Z91.81);Muscle weakness (generalized) (M62.81)     Time: 0347-4259 PT Time Calculation (min) (ACUTE ONLY): 13 min  Charges:    $Gait Training: 8-22 mins PT General Charges $$ ACUTE PT VISIT: 1 Visit                     Harini Dearmond, PT, GCS 08/20/23,11:07 AM

## 2023-08-20 NOTE — Discharge Summary (Signed)
Physician Discharge Summary   Patient: [name] MRN: [id number] DOB: 06/15/1961  Admit date:     08/11/2023  Discharge date: 08/20/23  Discharge Physician: Meredeth Ide   PCP: Corwin Levins, MD   Recommendations at discharge:   Follow-up PCP in 2 weeks Take Abilify 20 mg daily for 2 weeks then cut down to 10 mg daily  Discharge Diagnoses: Principal Problem:   COVID-19 virus infection Active Problems:   Acute exacerbation of chronic obstructive pulmonary disease (COPD) (HCC)   Headache(784.0)   Peripheral vascular disease (HCC)   Chronic iron deficiency anemia   Bipolar disorder (HCC)   Seizure disorder (HCC)   Hypophosphatemia   Essential hypertension   Cirrhosis (HCC)   Hypokalemia   Obesity (BMI 30-39.9)   OSA (obstructive sleep apnea)   Transaminitis   Steroid-induced psychosis, with delusions (HCC)  Resolved Problems:   * No resolved hospital problems. *  Hospital Course: 62 year old female with history of asthma/COPD with FEV1 of 2023 on Breztri and Dupixent, chronic urticaria, OSA on OSA nightly sleep CPAP, bipolar disorder, obesity, hypertension, seizure disorder, vocal cord paralysis, fibromyalgia and disability who presented with shortness of breath, nonproductive cough.  On presentation, COVID screening test came out to be positive.  Chest x-ray did not show any pneumonia.  Hospital course remarkable for persistent dyspnea despite being on room air.  She became confused, psychotic since 8/28.  CT head/MRI of the brain negative for acute findings.  Psychiatry consulted and following.  Adjusting medications      Assessment and Plan:    Acute exacerbation of COPD: This is secondary to COVID infection.  Chest x-ray did not show any pneumonia.  Presented with shortness of breath, cough. She was on  Solu-Medrol, azithromycin, bronchodilators, Zyrtec, Flonase, Singulair.  Continue intensive spirometry, flutter valve.  Continue PPI.She  remained on room  air.  Steroids discontinued due to concern for psychosis.  No wheezing today   COVID infection: Diagnosed about 5 days ago before admission.  Outside window for Paxlovid.  No infiltrates or hypoxia.  Did not require oxygen   Altered mentation /psychosis/history of bipolar disorder: She was found to be confused on 8/28.  Rapid response was called.She does not have any focal deficits.  She was complaining of being drowsy/drunk.  She was on a scheduled hydroxyzine which has been discontinued.  CT head was done which did not show any acute findings.  MRI of the brain did not show any acute findings.  Mildly elevated ammonia which may not have contributed to confusion.  Steroid discontinued due to concern for steroid induced psychosis.  Psychiatry consulted and following .increased the dose of aripiprazole, lithium discontinued due to tremors.  Tegretol will be to be continued -At this time she will be discharged home on Abilify 20 mg daily for 2 weeks and then cut down to 10 mg daily -Outpatient follow-up with psychiatry   Leukocytosis: This is most likely reactive secondary to steroids.     Elevated liver enzymes: In the setting of COVID.  Improving   OSA: Continue CPAP at night.   Obesity: BMI of 32   Hypokalemia/hypophosphatemia: Being monitored and supplemented as needed   Cirrhosis: Well compensated.  No ascites.  Started on lactulose 10 g p.o. twice daily for mild elevation of ammonia   Hypertension: Takes thiazide, ARB  at home.  Blood pressure was soft in the hospital.  Will discontinue HCTZ and continue with ARB.   Seizure disorder: On Tegretol.  Chronic iron deficiency  anemia: Current hemoglobin stable.   Peripheral vascular disease: Takes statin at home.     Constipation: Continue bowel regimen       Consultants:  Procedures performed:  Disposition: Home Diet recommendation:  Discharge Diet Orders (From admission, onward)     Start     Ordered   08/20/23 0000  Diet -  low sodium heart healthy        08/20/23 1141           Regular diet DISCHARGE MEDICATION: Allergies as of 08/20/2023       Reactions   Sulfa Antibiotics Nausea And Vomiting, Other (See Comments)   Venofer [iron Sucrose] Hives        Medication List     STOP taking these medications    Airsupra 90-80 MCG/ACT Aero Generic drug: Albuterol-Budesonide   azithromycin 250 MG tablet Commonly known as: ZITHROMAX   cyclobenzaprine 5 MG tablet Commonly known as: FLEXERIL   Dupixent 300 MG/2ML prefilled syringe Generic drug: dupilumab   gabapentin 100 MG capsule Commonly known as: NEURONTIN   hydrochlorothiazide 12.5 MG capsule Commonly known as: MICROZIDE   nirmatrelvir/ritonavir 20 x 150 MG & 10 x 100MG  Tabs Commonly known as: PAXLOVID   predniSONE 20 MG tablet Commonly known as: DELTASONE       TAKE these medications    albuterol 108 (90 Base) MCG/ACT inhaler Commonly known as: VENTOLIN HFA Inhale 2 puffs into the lungs every 6 (six) hours as needed for wheezing or shortness of breath.   ARIPiprazole 10 MG tablet Commonly known as: ABILIFY Take 1 tablet (10 mg total) by mouth daily. Take Abilify 20 mg po daily for 2 weeks till 9/17 , then continue taking 10 mg po daily What changed: additional instructions   atorvastatin 40 MG tablet Commonly known as: LIPITOR Take 1 tablet (40 mg total) by mouth daily. TAKE 1 TABLET(40 MG) BY MOUTH DAILY Strength: 40 mg What changed: additional instructions   azelastine 0.05 % ophthalmic solution Commonly known as: OPTIVAR Place 1 drop into both eyes 2 (two) times daily.   Breztri Aerosphere 160-9-4.8 MCG/ACT Aero Generic drug: Budeson-Glycopyrrol-Formoterol Inhale 2 puffs into the lungs in the morning and at bedtime.   budesonide 0.5 MG/2ML nebulizer solution Commonly known as: PULMICORT Take 0.5 mg by nebulization daily.   carbamazepine 200 MG tablet Commonly known as: TEGretol Take 2-3 tablets (400-600 mg  total) by mouth See admin instructions. Take 400mg  in the morning and 600mg  at bedtime.   cetirizine 10 MG tablet Commonly known as: ZYRTEC Can take one tablet by mouth once daily if needed for runny nose.   eucerin cream Apply 1 Application topically daily as needed for dry skin.   famotidine 40 MG tablet Commonly known as: PEPCID TAKE 1 TABLET(40 MG) BY MOUTH AT BEDTIME   ferrous sulfate 325 (65 FE) MG EC tablet Take 1 tablet (325 mg total) by mouth daily with breakfast.   fluticasone 50 MCG/ACT nasal spray Commonly known as: FLONASE Place 1 spray into both nostrils 2 (two) times daily as needed for allergies or rhinitis.   formoterol 20 MCG/2ML nebulizer solution Commonly known as: PERFOROMIST Take 20 mcg by nebulization 2 (two) times daily.   furosemide 20 MG tablet Commonly known as: LASIX 1 tab by mouth once daily as needed What changed:  how much to take how to take this when to take this reasons to take this additional instructions   hydrOXYzine 25 MG capsule Commonly known as:  VISTARIL Take 25 mg by mouth 2 (two) times daily as needed.   hydrOXYzine 25 MG tablet Commonly known as: ATARAX Take 25 mg by mouth 2 (two) times daily.   ipratropium 17 MCG/ACT inhaler Commonly known as: ATROVENT HFA 2 puffs every 4-6 hours as needed.   ipratropium-albuterol 0.5-2.5 (3) MG/3ML Soln Commonly known as: DUONEB Take 3 mLs by nebulization 2 (two) times daily.   irbesartan 75 MG tablet Commonly known as: AVAPRO TAKE 1 TABLET(75 MG) BY MOUTH DAILY What changed: See the new instructions.   lactulose 10 GM/15ML solution Commonly known as: CHRONULAC Take 15 mLs (10 g total) by mouth 2 (two) times daily.   montelukast 10 MG tablet Commonly known as: SINGULAIR Take 1 tablet (10 mg total) by mouth at bedtime.   pantoprazole 40 MG tablet Commonly known as: PROTONIX TAKE 1 TABLET(40 MG) BY MOUTH TWICE DAILY   tacrolimus 0.03 % ointment Commonly known as:  Protopic Apply 1 Application topically 2 (two) times daily as needed (rash). What changed: reasons to take this   triamcinolone ointment 0.1 % Commonly known as: KENALOG Apply 1 Application topically at bedtime.        Follow-up Information     Care, [facility] Follow up.   Specialty: Home Health Services Why: Agency will call you to set up apt times Contact information: [address] [place] [address] [phone]                Discharge Exam: Ceasar Mons Weights   08/18/23 0445 08/19/23 0432 08/20/23 0425  Weight: 79.3 kg 79.8 kg 80 kg   General-appears in no acute distress Heart-S1-S2, regular, no murmur auscultated Lungs-clear to auscultation bilaterally, no wheezing or crackles auscultated Abdomen-soft, nontender, no organomegaly Extremities-no edema in the lower extremities Neuro-alert, oriented x3, no focal deficit noted  Condition at discharge: good  The results of significant diagnostics from this hospitalization (including imaging, microbiology, ancillary and laboratory) are listed below for reference.   Imaging Studies: DG Abd 1 View  Result Date: 08/14/2023 CLINICAL DATA:  Confusion EXAM: ABDOMEN - 1 VIEW COMPARISON:  05/17/2016 FINDINGS: Supine frontal views of the abdomen and pelvis are obtained. Bowel gas pattern is unremarkable without obstruction or ileus. Moderate retained stool throughout the colon. No masses or abnormal calcifications. Lung bases are clear. No acute bony abnormalities. IMPRESSION: 1. No bowel obstruction or ileus. 2. Moderate retained stool throughout the colon. Electronically Signed   By: Sharlet Salina M.D.   On: 08/14/2023 16:25   MR BRAIN WO CONTRAST  Result Date: 08/14/2023 CLINICAL DATA:  Delirium.  Altered mental status. EXAM: MRI HEAD WITHOUT CONTRAST TECHNIQUE: Multiplanar, multiecho pulse sequences of the brain and surrounding structures were obtained without intravenous contrast. COMPARISON:  MR  head without contrast 12/02/2020 FINDINGS: Brain: The study is somewhat degraded by patient motion. Periventricular and subcortical T2 hyperintensities are similar the prior exam. No acute infarct, hemorrhage, or mass lesion is present. The brainstem and cerebellum are within normal limits. The internal auditory canals are within normal limits. Midline structures are within normal limits. Vascular: Flow is present in the major intracranial arteries. Skull and upper cervical spine: The T1 sagittal images are moderately degraded by patient motion. Craniocervical junction is grossly within normal limits. Sinuses/Orbits: The paranasal sinuses and mastoid air cells are clear. The globes and orbits are within normal limits. IMPRESSION: 1. No acute intracranial abnormality or significant interval change. 2. Periventricular and subcortical T2 hyperintensities bilaterally are stable. The finding is  nonspecific but can be seen in the setting of chronic microvascular ischemia, a demyelinating process such as multiple sclerosis, vasculitis, complicated migraine headaches, or as the sequelae of a prior infectious or inflammatory process. Electronically Signed   By: Marin Roberts M.D.   On: 08/14/2023 15:53   CT HEAD WO CONTRAST ( )  Result Date: 08/14/2023 CLINICAL DATA:  Mental status change, unknown cause. EXAM: CT HEAD WITHOUT CONTRAST TECHNIQUE: Contiguous axial images were obtained from the base of the skull through the vertex without intravenous contrast. RADIATION DOSE REDUCTION: This exam was performed according to the departmental dose-optimization program which includes automated exposure control, adjustment of the mA and/or kV according to patient size and/or use of iterative reconstruction technique. COMPARISON:  Head CT 09/12/2022. FINDINGS: Brain: No acute intracranial hemorrhage. Stable mild chronic small-vessel disease. Cortical gray-white differentiation is preserved. No hydrocephalus or extra-axial  collection. No mass effect or midline shift. Vascular: No hyperdense vessel or unexpected calcification. Skull: No calvarial fracture or suspicious bone lesion. Skull base is unremarkable. Sinuses/Orbits: No acute finding. Other: None. IMPRESSION: 1. No acute intracranial abnormality. 2. Stable mild chronic small-vessel disease. Electronically Signed   By: Orvan Falconer M.D.   On: 08/14/2023 11:00   DG Chest Portable 1 View  Result Date: 08/12/2023 CLINICAL DATA:  COVID-19 positivity with COPD exacerbation EXAM: PORTABLE CHEST 1 VIEW COMPARISON:  Film from the previous day. FINDINGS: The heart size and mediastinal contours are within normal limits. Both lungs are clear. The visualized skeletal structures are unremarkable. Aortic calcifications are noted. Postsurgical change of the cervical spine is noted. IMPRESSION: No active disease. Electronically Signed   By: Alcide Clever M.D.   On: 08/12/2023 01:15   DG Chest Port 1 View  Result Date: 08/11/2023 CLINICAL DATA:  62 year old female with history of shortness of breath. EXAM: PORTABLE CHEST 1 VIEW COMPARISON:  Chest x-ray 08/05/2023. FINDINGS: Lung volumes are normal. No consolidative airspace disease. No pleural effusions. No pneumothorax. No pulmonary nodule or mass noted. Pulmonary vasculature and the cardiomediastinal silhouette are within normal limits. Atherosclerosis in the thoracic aorta. Orthopedic fixation hardware in the cervical spine incidentally noted. IMPRESSION: 1.  No radiographic evidence of acute cardiopulmonary disease. 2. Aortic atherosclerosis. Electronically Signed   By: Trudie Reed M.D.   On: 08/11/2023 10:58   DG Chest 2 View  Result Date: 08/05/2023 CLINICAL DATA:  cp EXAM: CHEST - 2 VIEW COMPARISON:  CXR 06/23/23 FINDINGS: Hazy opacity at the left lung base is favored to represent atelectasis. No pleural effusion. No pneumothorax. Normal cardiac and mediastinal contours. No radiographically apparent displaced rib  fractures. Visualized upper abdomen is unremarkable. Vertebral body heights are maintained IMPRESSION: Left basilar atelectasis Electronically Signed   By: Lorenza Cambridge M.D.   On: 08/05/2023 12:33    Microbiology: Results for orders placed or performed during the hospital encounter of 08/11/23  Culture, blood (Routine X 2) w Reflex to ID Panel     Status: None   Collection Time: 08/12/23  1:58 AM   Specimen: BLOOD  Result Value Ref Range Status   Specimen Description BLOOD SITE NOT SPECIFIED  Final   Special Requests   Final    BOTTLES DRAWN AEROBIC AND ANAEROBIC Blood Culture adequate volume   Culture   Final    NO GROWTH 5 DAYS Performed at [facility], [address] [place], [place] [address]    Report Status 08/17/2023 FINAL  Final  Culture, blood (Routine X 2) w Reflex to ID Panel  Status: None   Collection Time: 08/12/23  4:57 AM   Specimen: BLOOD  Result Value Ref Range Status   Specimen Description BLOOD BLOOD RIGHT ARM  Final   Special Requests   Final    BOTTLES DRAWN AEROBIC AND ANAEROBIC Blood Culture adequate volume   Culture   Final    NO GROWTH 5 DAYS Performed at [facility], [address] [place], [place] [address]    Report Status 08/17/2023 FINAL  Final   *Note: Due to a large number of results and/or encounters for the requested time period, some results have not been displayed. A complete set of results can be found in Results Review.    Labs: CBC: Recent Labs  Lab 08/14/23 0411 08/15/23 0345 08/16/23 0625 08/17/23 0305  WBC 19.5* 17.8* 14.8* 20.7*  HGB 9.3* 11.3* 11.7* 11.6*  HCT 29.8* 36.3 37.5 36.8  MCV 83.0 84.0 83.5 85.2  PLT 450* 484* 514* 501*   Basic Metabolic Panel: Recent Labs  Lab 08/14/23 0411 08/15/23 0345 08/16/23 0625 08/17/23 0305  NA 135 136 135 135  K 3.2* 3.0* 3.8 4.1  CL 93* 91* 94* 96*  CO2 33* 33* 28 25  GLUCOSE 162* 153* 162* 135*  BUN 17 13 19 13   CREATININE 0.81 0.86 0.89 0.81  CALCIUM 8.5*  8.6* 8.7* 9.1  PHOS 3.5  --   --   --    Liver Function Tests: Recent Labs  Lab 08/14/23 0411 08/15/23 0345  AST 61* 41  ALT 135* 139*  ALKPHOS 59 66  BILITOT 0.2* <0.1*  PROT 5.7* 6.4*  ALBUMIN 3.4* 3.7   CBG: Recent Labs  Lab 08/19/23 1708 08/19/23 2124 08/19/23 2331 08/20/23 0657 08/20/23 1103  GLUCAP 130* 103* 126* 99 108*    Discharge time spent: greater than 30 minutes.  Signed: Meredeth Ide, MD [facility] 08/20/2023

## 2023-08-20 NOTE — TOC Transition Note (Signed)
Transition of Care [facility]) - CM/SW Discharge Note   Patient Details  Name: [name] MRN: [id number] Date of Birth: February 18, 1961  Transition of Care [facility]) CM/SW Contact:  Leone Haven, RN Phone Number: 08/20/2023, 12:36 PM   Clinical Narrative:    Patient is for dc today, she is set up with [facility]. NCM notified Kandee Keen with [facility] of the dc today.   Final next level of care: Home w Home Health Services Barriers to Discharge: No Barriers Identified   Patient Goals and CMS Choice   Choice offered to / list presented to : Patient  Discharge Placement                         Discharge Plan and Services Additional resources added to the After Visit Summary for   In-house Referral: NA Discharge Planning Services: CM Consult Post Acute Care Choice: NA          DME Arranged: N/A DME Agency: NA       HH Arranged: PT HH Agency: [facility] Date [facility] Agency Contacted: 08/19/23 Time HH Agency Contacted: 1346 Representative spoke with at [facility] Agency: Kandee Keen  Social Determinants of Health (SDOH) Interventions SDOH Screenings   Food Insecurity: No Food Insecurity (08/12/2023)  Housing: Low Risk  (08/12/2023)  Transportation Needs: No Transportation Needs (08/12/2023)  Utilities: Not At Risk (08/12/2023)  Alcohol Screen: Low Risk  (11/26/2022)  Depression (PHQ2-9): Low Risk  (06/18/2023)  Financial Resource Strain: Low Risk  (11/26/2022)  Physical Activity: Sufficiently Active (11/26/2022)  Social Connections: Socially Isolated (11/26/2022)  Stress: No Stress Concern Present (11/26/2022)  Tobacco Use: Medium Risk (08/12/2023)     Readmission Risk Interventions    08/12/2023   12:35 PM 02/27/2023   10:26 AM  Readmission Risk Prevention Plan  Transportation Screening Complete Complete  Medication Review (RN Care Manager) Complete Referral to Pharmacy  PCP or Specialist appointment within 3-5 days of discharge Complete Complete  HRI or Home Care  Consult Complete Complete  SW Recovery Care/Counseling Consult -- Complete  Palliative Care Screening Not Applicable Not Applicable  Skilled Nursing Facility Not Applicable Not Applicable

## 2023-08-20 NOTE — TOC Progression Note (Addendum)
Transition of Care [facility]) - Progression Note    Patient Details  Name: [name] MRN: [id number] Date of Birth: [birth year]  Transition [facility]) CM/SW Contact  Inis Sizer, LCSW Phone Number: 08/20/2023, 11:18 AM  Clinical Narrative:    CSW spoke with patient via phone per PT request to discuss discharge. Patient reports she wants to go home today. Patient states her son will transport her home. Patient states no additional TOC needs at this time. Patient is aware that home health services were arranged through [facility] and the agency will contact her directly to initiate services.  CSW informed MD and RN of patient's request.  CSW added outpatient psychiatry providers to patient's AVS.   Expected Discharge Plan: Home/Self Care Barriers to Discharge: No Barriers Identified  Expected Discharge Plan and Services In-house Referral: NA Discharge Planning Services: CM Consult Post Acute Care Choice: NA Living arrangements for the past 2 months: Apartment                 DME Arranged: N/A DME Agency: NA       HH Arranged: PT HH Agency: [facility] Date [facility] Agency Contacted: 08/19/23 Time HH Agency Contacted: 1346 Representative spoke with at [facility] Agency: Kandee Keen   Social Determinants of Health (SDOH) Interventions SDOH Screenings   Food Insecurity: No Food Insecurity (08/12/2023)  Housing: Low Risk  (08/12/2023)  Transportation Needs: No Transportation Needs (08/12/2023)  Utilities: Not At Risk (08/12/2023)  Alcohol Screen: Low Risk  (11/26/2022)  Depression (PHQ2-9): Low Risk  (06/18/2023)  Financial Resource Strain: Low Risk  (11/26/2022)  Physical Activity: Sufficiently Active (11/26/2022)  Social Connections: Socially Isolated (11/26/2022)  Stress: No Stress Concern Present (11/26/2022)  Tobacco Use: Medium Risk (08/12/2023)    Readmission Risk Interventions    08/12/2023   12:35 PM 02/27/2023   10:26 AM  Readmission Risk Prevention Plan   Transportation Screening Complete Complete  Medication Review (RN Care Manager) Complete Referral to Pharmacy  PCP or Specialist appointment within 3-5 days of discharge Complete Complete  HRI or Home Care Consult Complete Complete  SW Recovery Care/Counseling Consult -- Complete  Palliative Care Screening Not Applicable Not Applicable  Skilled Nursing Facility Not Applicable Not Applicable

## 2023-08-20 NOTE — Plan of Care (Signed)
  Problem: Respiratory: Goal: Complications related to the disease process, condition or treatment will be avoided or minimized Outcome: Progressing   Problem: Coping: Goal: Ability to adjust to condition or change in health will improve Outcome: Progressing

## 2023-08-21 ENCOUNTER — Ambulatory Visit: Payer: Self-pay | Admitting: Licensed Clinical Social Worker

## 2023-08-21 MED ORDER — NYSTATIN 100000 UNIT/ML MT SUSP: 5.0000 mL | Freq: Four times a day (QID) | OROMUCOSAL | 1 refills | Status: DC

## 2023-08-21 NOTE — Patient Instructions (Signed)
Social Work Visit Information  Thank you for taking time to visit with me today. Please don't hesitate to contact me if I can be of assistance to you.   Following are the goals we discussed today:   Goals Addressed             This Visit's Progress    COMPLETED: Care Coordination Activities / mental health support       Care Coordination Interventions: Continues with monthly and Quarterly appointment with psychiatry  Keep your therapy appointment with  Ms. Brown Sept. 11th.  Please call if you are unable to keep the appointment [facility] [address] [place], [place] [address] [email or web address]  Phone: [phone]                 No follow up scheduled with social work at this time. Patient will call office if needed.  Please call the care guide team at [phone] if you need to cancel or reschedule your appointment.   If you or anyone you know are experiencing a Mental Health or Behavioral Health Crisis or need someone to talk to, please call the Suicide and Crisis Lifeline: 988 call the [place] National Suicide Prevention Lifeline: (208) 668-8190 or TTY: 401 525 7327 TTY (254)364-0972) to talk to a trained counselor call 1-800-273-TALK (toll free, 24 hour hotline) go to [facility] [address], [place] [phone])   Patient verbalizes understanding of instructions and care plan provided today and agrees to view in MyChart. Active MyChart status and patient understanding of how to access instructions and care plan via MyChart confirmed with patient.       Sammuel Hines, LCSW [place]  [facility], [facility] Licensed Clinical Social Work Care Coordinator  Direct Dial: [phone]

## 2023-08-21 NOTE — Addendum Note (Signed)
Addended by: Alfonse Spruce on: 08/21/2023 05:40 PM   Modules accepted: Orders

## 2023-08-21 NOTE — Patient Outreach (Signed)
  Care Coordination  Follow Up Visit Note   08/21/2023 Name: [name] MRN: [id number] DOB: 1961/01/20  [name] is a 62 y.o. year old female who sees Corwin Levins, MD for primary care. I spoke with  [name] by phone today.  What matters to the patients health and wellness today?  Reconnecting with her therapist    Goals Addressed             This Visit's Progress    COMPLETED: Care Coordination Activities / mental health support       Care Coordination Interventions: Continues with monthly and Quarterly appointment with psychiatry  Keep your therapy appointment with  Ms. Brown Sept. 11th.  Please call if you are unable to keep the appointment [facility] [address] [place], [place] [address] [email or web address]  Phone: [phone]                SDOH assessments and interventions completed:  No   Care Coordination Interventions:  Yes, provided  Interventions Today    Flowsheet Row Most Recent Value  Chronic Disease   Chronic disease during today's visit Hypertension (HTN), Chronic Obstructive Pulmonary Disease (COPD)  General Interventions   General Interventions Discussed/Reviewed General Interventions Reviewed, Doctor Visits  [reminded of upcoming appointments]  Doctor Visits Discussed/Reviewed --  [assisted pt calling Cardiac Rehab to schedule appointment]  Education Interventions   Education Provided Provided Education  Mental Health Interventions   Mental Health Discussed/Reviewed Mental Health Reviewed  [assisted with rescheduling appointment with therapist]       Follow up plan: No further intervention required.   Encounter Outcome:  Patient Visit Completed   Sammuel Hines, LCSW [place]  [facility], [facility] Licensed Clinical Social Work Care Coordinator  Direct Dial: [phone]

## 2023-08-22 ENCOUNTER — Ambulatory Visit (HOSPITAL_COMMUNITY): Payer: Medicare HMO

## 2023-08-22 ENCOUNTER — Encounter: Payer: Self-pay | Admitting: *Deleted

## 2023-08-22 ENCOUNTER — Telehealth: Payer: Self-pay | Admitting: *Deleted

## 2023-08-22 DIAGNOSIS — J449 Chronic obstructive pulmonary disease, unspecified: Secondary | ICD-10-CM | POA: Diagnosis not present

## 2023-08-22 DIAGNOSIS — R32 Unspecified urinary incontinence: Secondary | ICD-10-CM | POA: Diagnosis not present

## 2023-08-22 NOTE — Transitions of Care (Post Inpatient/ED Visit) (Signed)
08/22/2023  Name: [name] MRN: [id number] DOB: 1961-06-18  Today's TOC FU Call Status: Today's TOC FU Call Status:: Successful TOC FU Call Completed TOC FU Call Complete Date: 08/22/23 Patient's Name and Date of Birth confirmed.  Transition Care Management Follow-up Telephone Call Date of Discharge: 08/20/23 Discharge Facility: [facility]) Type of Discharge: Inpatient Admission Primary Inpatient Discharge Diagnosis:: COVID; COPD exacerbation How have you been since you were released from the hospital?: Better ("I am doing okay but I just still feel short of breath every now and then; I am using the nebulizer and my oxygen levels are okay, in to 90's.  I guess this is just how COVID is-- it takes awhile before you get to feeling better.  I am using my walker") Any questions or concerns?: No  Items Reviewed: Did you receive and understand the discharge instructions provided?: Yes (thoroughly reviewed with patient who verbalizes good understanding of same) Medications obtained,verified, and reconciled?: Yes (Medications Reviewed) (Full medication reconciliation/ review completed; no concerns or discrepancies identified; confirmed patient obtained/ is taking all newly Rx'd medications as instructed; self-manages medications and denies questions/ concerns around medications today) Any new allergies since your discharge?: No Dietary orders reviewed?: Yes Type of Diet Ordered:: Regular, "as healthy as possible" Do you have support at home?: Yes People in Home: grandchild(ren) Name of Support/Comfort Primary Source: Reports independent in self-care activities; resides with supportive grandson who assists as/ if needed/ indicated  Medications Reviewed Today: Medications Reviewed Today     Reviewed by Michaela Corner, RN (Registered Nurse) on 08/22/23 at 1112  Med List Status: <None>   Medication Order Taking? Sig Documenting Provider Last Dose Status Informant   albuterol (VENTOLIN HFA) 108 (90 Base) MCG/ACT inhaler [id number] Yes Inhale 2 puffs into the lungs every 6 (six) hours as needed for wheezing or shortness of breath. Maurilio Lovely D, DO Taking Active Self, Pharmacy Records  ARIPiprazole (ABILIFY) 10 MG tablet [id number] Yes Take 1 tablet (10 mg total) by mouth daily. Take Abilify 20 mg po daily for 2 weeks till 9/17 , then continue taking 10 mg po daily [place], Sarina Ill, MD Taking Active   atorvastatin (LIPITOR) 40 MG tablet [id number] No Take 1 tablet (40 mg total) by mouth daily. TAKE 1 TABLET(40 MG) BY MOUTH DAILY Strength: 40 mg  Patient not taking: Reported on 08/22/2023   Corwin Levins, MD Not Taking Active Self, Pharmacy Records           Med Note Jonnie Kind Aug 22, 2023 10:57 AM) 08/22/23: Reports during TOC call, she is not taking- reports may need refill, she is unsure  azelastine (OPTIVAR) 0.05 % ophthalmic solution [id number] No Place 1 drop into both eyes 2 (two) times daily.  Patient not taking: Reported on 08/12/2023   Corwin Levins, MD Not Taking Active Self, Pharmacy Records           Med Note Jonnie Kind Aug 22, 2023 10:58 AM) 08/22/23: Reports during [facility] call not currently taking  Budeson-Glycopyrrol-Formoterol (BREZTRI AEROSPHERE) 160-9-4.8 MCG/ACT AERO [id number] Yes Inhale 2 puffs into the lungs in the morning and at bedtime. Maurilio Lovely D, DO Taking Active Self, Pharmacy Records  budesonide (PULMICORT) 0.5 MG/2ML nebulizer solution [id number] Yes Take 0.5 mg by nebulization daily. [provider] Taking Active Self, Pharmacy Records  carbamazepine (TEGRETOL) 200 MG tablet [id number] Yes Take 2-3 tablets (400-600 mg total) by mouth See admin instructions. Take 400mg   in the morning and 600mg  at bedtime. Archer Asa, MD Taking Active Self, Pharmacy Records  cetirizine (ZYRTEC) 10 MG tablet [id number] Yes Can take one tablet by mouth once daily if needed for runny nose. Alfonse Spruce, MD Taking Active  Self, Pharmacy Records  dupilumab (DUPIXENT) prefilled syringe 300 mg [id number]   Alfonse Spruce, MD  Active   famotidine (PEPCID) 40 MG tablet [id number] Yes TAKE 1 TABLET(40 MG) BY MOUTH AT BEDTIME Glenford Bayley, NP Taking Active Self, Pharmacy Records  ferrous sulfate 325 (65 FE) MG EC tablet [id number] Yes Take 1 tablet (325 mg total) by mouth daily with breakfast. Corwin Levins, MD Taking Active Self, Pharmacy Records  fluticasone [facility]) 50 MCG/ACT nasal spray [id number] Yes Place 1 spray into both nostrils 2 (two) times daily as needed for allergies or rhinitis. Alfonse Spruce, MD Taking Active Self, Pharmacy Records  formoterol (PERFOROMIST) 20 MCG/2ML nebulizer solution [id number] Yes Take 20 mcg by nebulization 2 (two) times daily. [provider] Taking Active Self, Pharmacy Records  furosemide (LASIX) 20 MG tablet [id number] Yes 1 tab by mouth once daily as needed  Patient taking differently: Take 20 mg by mouth daily as needed for edema.   Corwin Levins, MD Taking Active Self, Pharmacy Records           Med Note Jonnie Kind Aug 22, 2023 11:00 AM) 08/22/23: Reports during TOC call uses only PRN- has not needed recently per patient report   hydrOXYzine (ATARAX) 25 MG tablet [id number] Yes Take 25 mg by mouth 2 (two) times daily. [provider] Taking Active Self, Pharmacy Records  hydrOXYzine (VISTARIL) 25 MG capsule [id number] Yes Take 25 mg by mouth 2 (two) times daily as needed. [provider] Taking Active Self, Pharmacy Records           Med Note Lia Hopping, Duffy Rhody Aug 12, 2023  1:43 AM) Pt states she used both the tablet and capsule  ipratropium (ATROVENT HFA) 17 MCG/ACT inhaler [id number] Yes 2 puffs every 4-6 hours as needed. Alfonse Spruce, MD Taking Active Self, Pharmacy Records  ipratropium-albuterol (DUONEB) 0.5-2.5 (3) MG/3ML Criss Rosales [id number] Yes Take 3 mLs by nebulization 2 (two) times daily. Kathlen Mody, MD  Taking Active Self, Pharmacy Records  irbesartan (AVAPRO) 75 MG tablet [id number] Yes TAKE 1 TABLET(75 MG) BY MOUTH DAILY  Patient taking differently: Take 75 mg by mouth daily.   Corwin Levins, MD Taking Active Self, Pharmacy Records  lactulose [facility]) 10 GM/15ML solution [id number] Yes Take 15 mLs (10 g total) by mouth 2 (two) times daily. Meredeth Ide, MD Taking Active   montelukast (SINGULAIR) 10 MG tablet [id number] Yes Take 1 tablet (10 mg total) by mouth at bedtime. Alfonse Spruce, MD Taking Active Self, Pharmacy Records  nystatin (MYCOSTATIN) 100000 UNIT/ML suspension [id number] Yes Take 5 mLs (500,000 Units total) by mouth 4 (four) times daily. Alfonse Spruce, MD Taking Active            Med Note Jonnie Kind Aug 22, 2023 11:04 AM) 08/22/23: Reports during [facility] call she reports will pick up refill at outpatient pharmacy today   pantoprazole (PROTONIX) 40 MG tablet [id number] No TAKE 1 TABLET(40 MG) BY MOUTH TWICE DAILY  Patient not taking: Reported on 08/22/2023   Glenford Bayley, NP Not Taking Active Self, Pharmacy Records           Med Note [place],  Shela Leff   Thu Aug 22, 2023 11:02 AM) 08/22/23: Reports during TOC call she is not currently taking   Skin Protectants, Misc. (EUCERIN) cream [id number] Yes Apply 1 Application topically daily as needed for dry skin. [provider] Taking Active Self, Pharmacy Records  tacrolimus (PROTOPIC) 0.03 % ointment [id number] Yes Apply 1 Application topically 2 (two) times daily as needed (rash).  Patient taking differently: Apply 1 Application topically 2 (two) times daily as needed (recurrent rash).   Alfonse Spruce, MD Taking Active Self, Pharmacy Records  triamcinolone ointment (KENALOG) 0.1 % [id number] Yes Apply 1 Application topically at bedtime. Alfonse Spruce, MD Taking Active Self, Pharmacy Records           Home Care and Equipment/Supplies: Were Home Health Services Ordered?: Yes Name of Home  Health Agency:: [facility]- PT Has Agency set up a time to come to your home?: Yes First Home Health Visit Date: 08/22/23 Any new equipment or medical supplies ordered?: No  Functional Questionnaire: Do you need assistance with bathing/showering or dressing?: No Do you need assistance with meal preparation?: No Do you need assistance with eating?: No Do you have difficulty maintaining continence: No Do you need assistance with getting out of bed/getting out of a chair/moving?: No Do you have difficulty managing or taking your medications?: No  Follow up appointments reviewed: PCP Follow-up appointment confirmed?: Yes Date of PCP follow-up appointment?: 08/30/23 Follow-up Provider: PCP Specialist Hospital Follow-up appointment confirmed?: NA (verified not indicated per hospital discharging provider discharge notes: confirmed she is scheduled to see pulmonary provider on 10/09/23) Do you need transportation to your follow-up appointment?: No Do you understand care options if your condition(s) worsen?: Yes-patient verbalized understanding  SDOH Interventions Today    Flowsheet Row Most Recent Value  SDOH Interventions   Food Insecurity Interventions Intervention Not Indicated  Transportation Interventions Intervention Not Indicated  [reports her grandson whom she resides with provides transportation]      TOC Interventions Today    Flowsheet Row Most Recent Value  TOC Interventions   TOC Interventions Discussed/Reviewed TOC Interventions Discussed      Interventions Today    Flowsheet Row Most Recent Value  Chronic Disease   Chronic disease during today's visit Chronic Obstructive Pulmonary Disease (COPD), Other  [COVID-19]  General Interventions   General Interventions Discussed/Reviewed General Interventions Discussed, Durable Medical Equipment (DME), Doctor Visits, Communication with  [confirmed currently active/ established with RN CM Care Coordinator with follow up telephone  visit on 09/10/23]  Doctor Visits Discussed/Reviewed Specialist, PCP, Doctor Visits Discussed  Durable Medical Equipment (DME) Dan Humphreys, Other  [confirmed currently requiring/ using assistive devices - walker]  PCP/Specialist Visits Compliance with follow-up visit  Communication with RN  Education Interventions   Education Provided Provided Education  Provided Verbal Education On Medication  [encouraged patient to discuss possibility of streamlining multiple inhalers and nebulizer medications with pulmonary provider at time of upcoming pulmonary provider appointment on 10/09/23]  Nutrition Interventions   Nutrition Discussed/Reviewed Nutrition Discussed  Pharmacy Interventions   Pharmacy Dicussed/Reviewed Pharmacy Topics Discussed, Medications and their functions  [mulaiplt inhalers and nebulizer medications,  Full medication review with updating medication list in EHR per patient report]  Safety Interventions   Safety Discussed/Reviewed Safety Discussed, Fall Risk      Caryl Pina, RN, BSN, CCRN Alumnus RN CM Care Coordination/ Transition of Care- [facility] Care Management [phone]: direct office

## 2023-08-23 ENCOUNTER — Encounter: Payer: Self-pay | Admitting: Pharmacist

## 2023-08-23 ENCOUNTER — Telehealth: Payer: Self-pay | Admitting: Internal Medicine

## 2023-08-23 DIAGNOSIS — J441 Chronic obstructive pulmonary disease with (acute) exacerbation: Secondary | ICD-10-CM | POA: Diagnosis not present

## 2023-08-23 DIAGNOSIS — U071 COVID-19: Secondary | ICD-10-CM | POA: Diagnosis not present

## 2023-08-23 DIAGNOSIS — I700 Atherosclerosis of aorta: Secondary | ICD-10-CM | POA: Diagnosis not present

## 2023-08-23 DIAGNOSIS — F319 Bipolar disorder, unspecified: Secondary | ICD-10-CM | POA: Diagnosis not present

## 2023-08-23 DIAGNOSIS — I10 Essential (primary) hypertension: Secondary | ICD-10-CM | POA: Diagnosis not present

## 2023-08-23 DIAGNOSIS — G4733 Obstructive sleep apnea (adult) (pediatric): Secondary | ICD-10-CM | POA: Diagnosis not present

## 2023-08-23 DIAGNOSIS — I739 Peripheral vascular disease, unspecified: Secondary | ICD-10-CM | POA: Diagnosis not present

## 2023-08-23 DIAGNOSIS — M797 Fibromyalgia: Secondary | ICD-10-CM | POA: Diagnosis not present

## 2023-08-23 DIAGNOSIS — G40909 Epilepsy, unspecified, not intractable, without status epilepticus: Secondary | ICD-10-CM | POA: Diagnosis not present

## 2023-08-23 NOTE — Telephone Encounter (Signed)
Ok for verbals 

## 2023-08-23 NOTE — Telephone Encounter (Signed)
2x 2  1x 3  HH ORDERS   Caller Name: [facility] Agency Name: Randol[facility] Idol Phone #: [phone](secure)  Service Requested: PT (examples: OT/PT/Skilled Nursing/Social Work/Speech Therapy/Wound Care)  Frequency of Visits: 2X a week for 2 weeks, 1X a week for 3 weeks

## 2023-08-26 ENCOUNTER — Other Ambulatory Visit: Payer: Self-pay | Admitting: *Deleted

## 2023-08-26 MED ORDER — DUPIXENT 300 MG/2ML ~~LOC~~ SOSY: 300.0000 mg | PREFILLED_SYRINGE | SUBCUTANEOUS | 3 refills | Status: DC

## 2023-08-26 NOTE — Telephone Encounter (Signed)
Called and gave verbals.

## 2023-08-27 ENCOUNTER — Ambulatory Visit (HOSPITAL_COMMUNITY): Payer: Medicare HMO

## 2023-08-27 DIAGNOSIS — G4733 Obstructive sleep apnea (adult) (pediatric): Secondary | ICD-10-CM | POA: Diagnosis not present

## 2023-08-27 DIAGNOSIS — U071 COVID-19: Secondary | ICD-10-CM | POA: Diagnosis not present

## 2023-08-27 DIAGNOSIS — I700 Atherosclerosis of aorta: Secondary | ICD-10-CM | POA: Diagnosis not present

## 2023-08-27 DIAGNOSIS — J441 Chronic obstructive pulmonary disease with (acute) exacerbation: Secondary | ICD-10-CM | POA: Diagnosis not present

## 2023-08-27 DIAGNOSIS — I10 Essential (primary) hypertension: Secondary | ICD-10-CM | POA: Diagnosis not present

## 2023-08-27 DIAGNOSIS — F319 Bipolar disorder, unspecified: Secondary | ICD-10-CM | POA: Diagnosis not present

## 2023-08-27 DIAGNOSIS — G40909 Epilepsy, unspecified, not intractable, without status epilepticus: Secondary | ICD-10-CM | POA: Diagnosis not present

## 2023-08-27 DIAGNOSIS — I739 Peripheral vascular disease, unspecified: Secondary | ICD-10-CM | POA: Diagnosis not present

## 2023-08-27 DIAGNOSIS — M797 Fibromyalgia: Secondary | ICD-10-CM | POA: Diagnosis not present

## 2023-08-28 DIAGNOSIS — F6381 Intermittent explosive disorder: Secondary | ICD-10-CM | POA: Diagnosis not present

## 2023-08-29 ENCOUNTER — Ambulatory Visit: Payer: Medicare HMO

## 2023-08-29 ENCOUNTER — Ambulatory Visit (INDEPENDENT_AMBULATORY_CARE_PROVIDER_SITE_OTHER): Payer: Medicare HMO | Admitting: Allergy & Immunology

## 2023-08-29 ENCOUNTER — Other Ambulatory Visit: Payer: Self-pay

## 2023-08-29 ENCOUNTER — Encounter: Payer: Self-pay | Admitting: Allergy & Immunology

## 2023-08-29 ENCOUNTER — Ambulatory Visit (HOSPITAL_COMMUNITY): Payer: Medicare HMO

## 2023-08-29 VITALS — BP 132/90 | HR 94 | Temp 97.7°F | Wt 182.0 lb

## 2023-08-29 DIAGNOSIS — K219 Gastro-esophageal reflux disease without esophagitis: Secondary | ICD-10-CM

## 2023-08-29 DIAGNOSIS — J3089 Other allergic rhinitis: Secondary | ICD-10-CM | POA: Diagnosis not present

## 2023-08-29 DIAGNOSIS — J4550 Severe persistent asthma, uncomplicated: Secondary | ICD-10-CM

## 2023-08-29 DIAGNOSIS — R3915 Urgency of urination: Secondary | ICD-10-CM | POA: Diagnosis not present

## 2023-08-29 MED ORDER — EPINASTINE HCL 0.05 % OP SOLN: 1.0000 [drp] | Freq: Two times a day (BID) | OPHTHALMIC | 12 refills | Status: DC

## 2023-08-29 NOTE — Patient Instructions (Addendum)
1. Asthma-COPD overlap syndrome - Lung testing looks worse today, but you are still recovering from your hospitalization and breathing exacerbation. - We are restarting the Dupixent (I will send my note to Dr. Delton Coombes to make sure that he is ok with continuing with this).  - Daily controller medication(s): Dupixent every two weeks and Breztri two puffs twice daily - Prior to physical activity: Ventolin (GRAY INHALER) 2 puffs 10-15 minutes before physical activity. - Rescue medications: albuterol nebulizer one vial every 4-6 hours as needed OR Ventolin (GRAY INHALER) 2 puffs AND 2 puffs Atrovent (GREEN INHALER) every 4-6 hours as needed - Asthma control goals:  * Full participation in all desired activities (may need albuterol before activity) * Albuterol use two time or less a week on average (not counting use with activity) * Cough interfering with sleep two time or less a month * Oral steroids no more than once a year * No hospitalizations  2. Chronic urticaria - under good control - Continue with levocetirizine twice daily EVERY DAY.  - Continue with famotidine twice daily EVERY DAY.  - Continue with Doxepin 25mg  at night to help with itching AS NEEDED.  - Continue with triamcinolone twice daily as needed for your lesion.   3. 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 - Continue with allergy shots at the same schedule (come back next week so you start your allergy shots again).  - I sent in epinastine eye drops to use twice daily as needed (unsure if they are covered, it was not clear at all).   5. Follow up in 1-2 months or earlier if needed.    Please inform us of any Emergency Department visits, hospitalizations, or changes in symptoms. Call us before going to the ED for breathing or allergy symptoms since we might be able to fit you in for a sick visit. Feel free to contact us anytime with any questions,  problems, or concerns.  It was a pleasure to see you again today!  Websites that have reliable patient information:  1. American Academy of Asthma, Allergy, and Immunology: www.aaaai.org 2. Food Allergy Research and Education (FARE): foodallergy.org 3. Mothers of Asthmatics: http://www.asthmacommunitynetwork.org 4. American College of Allergy, Asthma, and Immunology: www.acaai.org   COVID-19 Vaccine Information can be found at: [email or web address] For questions related to vaccine distribution or appointments, please email [email or web address] or call [phone].     "Like" [place] on Facebook and Instagram for our latest updates!       Make sure you are registered to vote! If you have moved or changed any of your contact information, you will need to get this updated before voting!  In some cases, you MAY be able to register to vote online: [email or web address]

## 2023-08-29 NOTE — Progress Notes (Signed)
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 Liver cirrhosis via chest CT (March 2024)    Perennial allergic rhinitis (dust mites) - on allergen immunotherapy   Chronic urticaria - controlled with antihistamines alone   Recurrent infections - with largely normal workup in 2020 and then lacluster Streptococcal titers in November 2023   Complicated past medical history, including disability as well as bipolar 1 depression and vitamin D deficiency  Urinary urgency - referral to Urology  Plan/Recommendations:   1. Asthma-COPD overlap syndrome - Lung testing looks worse today, but you are still recovering from your hospitalization and breathing exacerbation. - We are restarting the Dupixent (I will send my note to Dr. Delton Coombes to make sure that he is ok with continuing with this).  - Daily controller medication(s): Dupixent every two weeks and Breztri two puffs twice daily - Prior to physical activity: Ventolin (GRAY INHALER) 2 puffs 10-15 minutes before physical activity. - Rescue medications: albuterol nebulizer one vial every 4-6 hours as needed OR Ventolin (GRAY INHALER) 2 puffs AND 2 puffs Atrovent (GREEN INHALER) every 4-6 hours as needed - Asthma control goals:  * Full participation in all desired activities (may need albuterol before activity) * Albuterol use two time or less a week on average (not counting use with activity) * Cough interfering with sleep two time or less a month * Oral steroids no more than once a year * No  hospitalizations  2. Chronic urticaria - under good control - Continue with levocetirizine twice daily EVERY DAY.  - Continue with famotidine twice daily EVERY DAY.  - Continue with Doxepin 25mg  at night to help with itching AS NEEDED.  - Continue with triamcinolone twice daily as needed for your lesion.   3. 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 - Continue with allergy shots at the same schedule (come back next week so you start your allergy shots again).  - I sent in epinastine eye drops to use twice daily as needed (unsure if they are covered, it was not clear at all).   5. Follow up in 1-2 months or earlier if needed.   Subjective:   [name] is a 62 y.o. female presenting today for follow up of  Chief Complaint  Patient presents with   Asthma   Follow-up    Pt. Was hospitalize for COVID for 10 days. She complains of weakness and shortness of breath.    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   Acute exacerbation of chronic obstructive pulmonary disease (COPD) (HCC) 08/12/2023   Hypokalemia 08/12/2023   Hyperglycemia 08/12/2023   History of seizures 08/12/2023   Obesity (BMI 30-39.9) 08/12/2023   OSA (obstructive sleep apnea) 08/12/2023   Transaminitis 08/12/2023   COVID-19 virus infection 08/07/2023   Acute respiratory failure with hypoxia and hypercapnia (HCC) 06/23/2023   Peripheral edema  06/18/2023   Cirrhosis (HCC) 03/07/2023   Vocal cord dysfunction 03/05/2023   Urinary incontinence 01/25/2023   Influenza A 01/11/2023   Bilateral impacted cerumen 12/04/2022   Not well controlled severe persistent asthma 10/25/2022   Acute on chronic respiratory failure with hypoxemia (HCC) 10/07/2022   Abnormal TSH 09/13/2022   Chronic sinusitis  08/14/2022   PND (paroxysmal nocturnal dyspnea) 06/07/2022   Vitamin D deficiency 05/20/2022   Eczema 05/18/2022   Insomnia 05/18/2022   Allergic conjunctivitis 05/18/2022   Chronic respiratory failure with hypoxia and hypercapnia (HCC) 04/30/2022   Bilateral hearing loss 04/14/2022   Moderate persistent asthma without complication 02/02/2022   Low back pain 10/11/2021   Abnormal CXR 07/17/2021   Aortic atherosclerosis (HCC) 07/05/2021   URI (upper respiratory infection) 07/05/2021   Arthrodesis status 06/20/2021   Drug allergy 05/14/2021   Acute cough 05/14/2021   Bilateral leg weakness 04/17/2021   Status post cervical spinal fusion 12/27/2020   History of stroke 12/11/2020   Medication management 10/21/2020   Body mass index (BMI) 26.0-26.9, adult 08/23/2020   Healthcare maintenance 08/11/2020   Cervical spondylosis with myelopathy and radiculopathy 07/28/2020   Preop exam for internal medicine 06/19/2020   Hypotension due to medication 06/11/2020   Essential hypertension 05/17/2020   Other spondylosis with myelopathy, cervical region 02/16/2020   Cervical spondylosis 02/02/2020   Radiculopathy, cervical region 02/02/2020   Spondylolysis, cervical region 02/02/2020   Neck pain 02/02/2020   Leg cramping 08/12/2019   Laryngopharyngeal reflux (LPR) 05/26/2019   Sinus pressure 05/26/2019   Hypophosphatemia 01/07/2019   Vertigo 09/23/2018   Dysfunction of left eustachian tube 09/23/2018   Gait disorder 04/21/2018   Leukocytosis 04/15/2018   Nausea & vomiting 04/15/2018   Seizure disorder (HCC)    Prolapsed cervical intervertebral disc 03/11/2018   Cervical radiculopathy 02/18/2018   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   Syncope 09/04/2017   Constipation 09/04/2017   Nonintractable headache 09/04/2017   Leg swelling 07/26/2017   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   Hyperlipidemia 04/20/2017   Pedal edema 04/19/2017   Bipolar disorder (HCC) 09/19/2016   Facial pain  07/12/2016   Rash 06/14/2016   Asthma/COPD exacerbation 02/21/2016   Migraine 01/25/2016   Idiopathic urticaria/pruritus 01/23/2016   Allergic rhinitis 01/23/2016   Oral candidiasis 01/23/2016   Greater trochanteric bursitis of both hips 09/08/2015   Chest pain 06/22/2015   Itching 06/22/2015   Bilateral knee pain 06/22/2015   Bilateral hip pain 06/22/2015   Chronic iron deficiency anemia 12/22/2014   Fatigue 12/21/2014   Rash and nonspecific skin eruption 08/24/2014   Intertrigo 08/07/2014   Dyspareunia 07/12/2014   Menopausal syndrome (hot flushes) 07/12/2014   History of tobacco abuse 07/12/2014   Dizziness 06/22/2014   Weakness 06/22/2014   Fibromyalgia 05/14/2014   Encounter for well adult exam with abnormal findings 04/22/2014   Recurrent boils 04/22/2014   Recurrent falls 04/22/2014   Peripheral vascular disease (HCC)    Depression    Anxiety    GERD (gastroesophageal reflux disease)    Prediabetes 02/23/2014   COPD exacerbation (HCC) 02/23/2014   Screening mammogram for high-risk patient 02/23/2014   Back pain 07/22/2013   COPD/asthma 08/24/2009   Headache(784.0) 08/24/2009   HTN (hypertension) 05/12/2009   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    History obtained from: chart review and patient.  Danett is a 62 y.o. female presenting for a follow up visit.  She was last seen in this clinic in August 2024.  At that time, she  was experiencing symptoms of an exacerbation of her asthma COPD overlap.  We stopped her Ventolin and gave her a sample of AirSupra to use as needed.  We continued her on Dupixent every 2 weeks and Breztri 2 puffs twice daily.  For her urticaria, we referred her to dermatology of her skin lesions.  We started levocetirizine as well as famotidine and doxepin as needed.  For her itching, we continue with hydroxyzine 50 mg at night.  For her rhinitis, we continue with Flonase as well as allergy shots.  Since last visit, she unfortunately has had  COVID-19.  She was then admitted at the end of August for COPD exacerbation. She says that she was scared. She did not see Pulmonology when they were in there. She was given systemic steroids as well as azithromycin and bronchodilators. She was in the hospital for ten days. Per the patient, it was recommended that she stop the Dupixent. I cannot seem to find that in the notes from the hospital. She has a follow up with Pulmonology on October 23rd.   She is due to the Dupixent today. Her last injectio nwas August 13th.  Asthma/Respiratory Symptom History: She is doing Breztri two puffs twice daily. She is good with  doing this and knows her dosing. She thinks that this was stopped in the hospital as well. She also has her rescue inhaler. She has a nebulizer at home and she has been using that more than usual - every 4-6 hours since discharge. She is off of her prednisone. Last dose of prednisone was last month sometime. She has Ventolin to use as needed. She also does an Atrovent MDI two puffs as needed, but she does not use this often at all.    Allergic Rhinitis Symptom History: Nasal symptoms come and go. She has some ocular issues right now with puffiness and itching every morning.  She got her allergy shot today. She does great with those. She has some eye drops that were given to her by her PCP.   Skin Symptom History: Hives and itching are under good control.  She is going to see Dr. Onalee Hua in January 2025.   She is wondering about any connection between urinary urgency and breathing. She reports that she had some SOB at the car wash the other day. Then she suddenly had an urge to sue the restroom and ended up wetting herself. She has never seen Urology.   Otherwise, there have been no changes to her past medical history, surgical history, family history, or social history.    Review of systems otherwise negative other than that mentioned in the HPI.    Objective:   Blood pressure (!)  132/90, pulse 94, temperature 97.7 F (36.5 C), temperature source Temporal, weight 182 lb (82.6 kg), SpO2 96%. Body mass index is 32.24 kg/m.    Physical Exam Vitals reviewed.  Constitutional:      Appearance: She is well-developed.     Comments: Very pleasant. Cooperative with the exam.  Breathing comfortably.  HENT:     Head: Normocephalic and atraumatic.     Right Ear: Tympanic membrane, ear canal and external ear normal.     Left Ear: Tympanic membrane, ear canal and external ear normal.     Nose: No nasal deformity, septal deviation, mucosal edema or rhinorrhea.     Right Turbinates: Enlarged, swollen and pale.     Left Turbinates: Enlarged, swollen and pale.     Right Sinus: No maxillary  sinus tenderness or frontal sinus tenderness.     Left Sinus: No maxillary sinus tenderness or frontal sinus tenderness.     Comments: No nasal polyps noted.     Mouth/Throat:     Mouth: Mucous membranes are not pale and not dry.     Pharynx: Uvula midline.     Comments: Cobblestoning present in the posterior oropharynx.  Eyes:     General: Lids are normal. Allergic shiner present.        Right eye: No discharge.        Left eye: No discharge.     Conjunctiva/sclera: Conjunctivae normal.     Right eye: Right conjunctiva is not injected. No chemosis.    Left eye: Left conjunctiva is not injected. No chemosis.    Pupils: Pupils are equal, round, and reactive to light.  Cardiovascular:     Rate and Rhythm: Normal rate and regular rhythm.     Heart sounds: Normal heart sounds.  Pulmonary:     Effort: Pulmonary effort is normal. No tachypnea, accessory muscle usage or respiratory distress.     Breath sounds: Normal breath sounds. Decreased air movement present. No wheezing, rhonchi or rales.     Comments: No wheezing appreciated. Decreased air movement at the bases. Chest:     Chest wall: No tenderness.  Lymphadenopathy:     Cervical: No cervical adenopathy.  Skin:    General: Skin is  warm.     Capillary Refill: Capillary refill takes less than 2 seconds.     Coloration: Skin is not pale.     Findings: No abrasion, erythema, petechiae or rash. Rash is not papular, urticarial or vesicular.     Comments: Skin looks clear.   Neurological:     Mental Status: She is alert.  Psychiatric:        Behavior: Behavior is cooperative.      Diagnostic studies:    Spirometry: results abnormal (FEV1: 0.65/31%, FVC: 1.59/61%, FEV1/FVC: 41%).    Spirometry consistent with mixed obstructive and restrictive disease.   Allergy Studies: none        Malachi Bonds, MD  [facility]

## 2023-08-30 ENCOUNTER — Ambulatory Visit: Payer: Medicare HMO | Admitting: Internal Medicine

## 2023-08-30 VITALS — BP 114/78 | HR 90 | Temp 98.0°F | Ht 63.0 in | Wt 180.0 lb

## 2023-08-30 DIAGNOSIS — J441 Chronic obstructive pulmonary disease with (acute) exacerbation: Secondary | ICD-10-CM

## 2023-08-30 DIAGNOSIS — I10 Essential (primary) hypertension: Secondary | ICD-10-CM | POA: Diagnosis not present

## 2023-08-30 DIAGNOSIS — Z23 Encounter for immunization: Secondary | ICD-10-CM | POA: Diagnosis not present

## 2023-08-30 MED ORDER — ATORVASTATIN CALCIUM 40 MG PO TABS: 40.0000 mg | ORAL_TABLET | Freq: Every day | ORAL | 3 refills | Status: DC

## 2023-08-30 NOTE — Patient Instructions (Signed)
Please continue all other medications as before, and refills have been done if requested - lipitor  Please have the pharmacy call with any other refills you may need.  Please continue your efforts at being more active, low cholesterol diet, and weight control.  Please keep your appointments with your specialists as you may have planned  Please make an Appointment to return in 6 months, or sooner if needed

## 2023-08-30 NOTE — Progress Notes (Unsigned)
Patient ID: [name], female   DOB: 1961-05-06, 62 y.o.   MRN: [id number]        Chief Complaint: follow up post hospn aug 25 - sept 3 2024       HPI:  [name] is a 62 y.o. female here with c/o above due to covid illness. Has fatigue and non prod cough controlled with otc prep.   Pt denies chest pain, increased sob or doe, wheezing, orthopnea, PND, increased LE swelling, palpitations, dizziness or syncope.   Pt denies polydipsia, polyuria, or new focal neuro s/s.    Pt denies fever, wt loss, night sweats, loss of appetite, or other constitutional symptoms  Due for flu shot.        Transitional Care Management elements noted today: 1)  Date of D/C: as above 2)  Medication reconciliation:  done today at end visit 3)  Review of D/C summary or other information:  done today 4)  Review of need for f/u on pending diagnostic tests and treatments:  done today - none 5)  Review of need for Interaction with other providers who will assume or resume care of pt specific problems: done today- for pulm f/u 6)  Education of patient/family/guardian or caregiver: none today  Wt Readings from Last 3 Encounters:  08/30/23 180 lb (81.6 kg)  08/29/23 182 lb (82.6 kg)  08/20/23 176 lb 4.8 oz (80 kg)   BP Readings from Last 3 Encounters:  08/30/23 114/78  08/29/23 (!) 132/90  08/20/23 129/67         Past Medical History:  Diagnosis Date   Anemia, iron deficiency 12/22/2014   pt. 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      Bipolar disorder (HCC)    with anxiety, depression   COPD (chronic obstructive pulmonary disease) (HCC)    Eczema 05/18/2022   Fibromyalgia 05/14/2014   GERD (gastroesophageal reflux disease)    History of kidney stones    Hyperlipidemia 04/20/2017    HYPERTENSION 05/12/2009   Qualifier: Diagnosis of  By: Truman Hayward Duncan Dull), [name]     Peripheral vascular disease (HCC)    Pneumonia    Prediabetes 02/23/2014   pt. denies   Seizure (HCC)    Stroke (HCC) 11/2020   Urticaria    Past Surgical History:  Procedure Laterality Date   ABDOMINAL HYSTERECTOMY     ANTERIOR CERVICAL DECOMP/DISCECTOMY FUSION N/A 07/28/2020   Procedure: ANTERIOR CERVICAL DECOMPRESSION/DISCECTOMY FUSION. INTERBODY PROTHESIS, PLATE/SCREWS CERVICAL THREE- CERVICAL FOUR, CERVICAL FOUR- CERVICAL FIVE;  Surgeon: Tressie Stalker, MD;  Location: [facility] OR;  Service: Neurosurgery;  Laterality: N/A;   BACK SURGERY     COLONOSCOPY  12/20/2011   Procedure: COLONOSCOPY;  Surgeon: Freddy Jaksch, MD;  Location: [facility] END[facility]COPY;  Service: Endoscopy;  Laterality: N/A;   COLONOSCOPY  03/05/2012   Procedure: COLONOSCOPY;  Surgeon: Freddy Jaksch, MD;  Location: [facility] ENDOSCOPY;  Service: Endoscopy;  Laterality: N/A;   DIAGNOSTIC LAPAROSCOPY     HEMORRHOID SURGERY     INCISE AND DRAIN ABCESS     KIDNEY STONE SURGERY     NECK SURGERY     x 2 Dr Terrilee Files   SPINE SURGERY  2019   TOE SURGERY     TUBAL LIGATION      reports that she quit  smoking about 4 years ago. Her smoking use included cigarettes. She started smoking about 40 years ago. She has a 9 pack-year smoking history. She has never used smokeless tobacco. She reports that she does not currently use alcohol. She reports that she does not currently use drugs after having used the following drugs: Marijuana. family history includes Alcohol abuse in her brother and father; Anemia in her daughter, sister, and sister; Anesthesia problems in her father; Asthma in her brother, mother, and sister; Brain cancer in her sister; Breast cancer (age of onset: 31) in her sister; COPD in her mother and sister; Colon cancer (age of onset: 77) in her father; Diabetes in her brother, father, mother, sister, sister, sister, and sister; Heart disease in her  brother, brother, mother, and sister; Hypertension in her brother, daughter, sister, sister, sister, sister, and sister; Kidney disease in her father; Sleep apnea in her brother. Allergies  Allergen Reactions   Sulfa Antibiotics Nausea And Vomiting and Other (See Comments)   Venofer [Iron Sucrose] Hives   Current Outpatient Medications on File Prior to Visit  Medication Sig Dispense Refill   albuterol (VENTOLIN HFA) 108 (90 Base) MCG/ACT inhaler Inhale 2 puffs into the lungs every 6 (six) hours as needed for wheezing or shortness of breath. 18 g 1   ARIPiprazole (ABILIFY) 10 MG tablet Take 1 tablet (10 mg total) by mouth daily. Take Abilify 20 mg po daily for 2 weeks till 9/17 , then continue taking 10 mg po daily 30 tablet 1   azelastine (OPTIVAR) 0.05 % ophthalmic solution Place 1 drop into both eyes 2 (two) times daily. 6 mL 12   Budeson-Glycopyrrol-Formoterol (BREZTRI AEROSPHERE) 160-9-4.8 MCG/ACT AERO Inhale 2 puffs into the lungs in the morning and at bedtime. 10.7 g 11   carbamazepine (TEGRETOL) 200 MG tablet Take 2-3 tablets (400-600 mg total) by mouth See admin instructions. Take 400mg  in the morning and 600mg  at bedtime. 150 tablet 2   cetirizine (ZYRTEC) 10 MG tablet Can take one tablet by mouth once daily if needed for runny nose. 30 tablet 5   dupilumab (DUPIXENT) 300 MG/2ML prefilled syringe Inject 300 mg into the skin every 14 (fourteen) days. 12 mL 3   Epinastine HCl 0.05 % ophthalmic solution Place 1 drop into both eyes 2 (two) times daily. 10 mL 12   famotidine (PEPCID) 40 MG tablet TAKE 1 TABLET(40 MG) BY MOUTH AT BEDTIME 30 tablet 3   ferrous sulfate 325 (65 FE) MG EC tablet Take 1 tablet (325 mg total) by mouth daily with breakfast. 90 tablet 1   fluticasone (FLONASE) 50 MCG/ACT nasal spray Place 1 spray into both nostrils 2 (two) times daily as needed for allergies or rhinitis. 16 g 5   formoterol (PERFOROMIST) 20 MCG/2ML nebulizer solution Take 20 mcg by nebulization 2 (two)  times daily.     furosemide (LASIX) 20 MG tablet 1 tab by mouth once daily as needed (Patient taking differently: Take 20 mg by mouth daily as needed for edema.) 30 tablet 5   ipratropium (ATROVENT HFA) 17 MCG/ACT inhaler 2 puffs every 4-6 hours as needed. 12.9 g 5   ipratropium-albuterol (DUONEB) 0.5-2.5 (3) MG/3ML SOLN Take 3 mLs by nebulization 2 (two) times daily. 360 mL 3   lactulose (CHRONULAC) 10 GM/15ML solution Take 15 mLs (10 g total) by mouth 2 (two) times daily. 500 mL 0   montelukast (SINGULAIR) 10 MG tablet Take 1 tablet (10 mg total) by mouth at bedtime. 90 tablet 1  nystatin (MYCOSTATIN) 100000 UNIT/ML suspension Take 5 mLs (500,000 Units total) by mouth 4 (four) times daily. 473 mL 1   pantoprazole (PROTONIX) 40 MG tablet TAKE 1 TABLET(40 MG) BY MOUTH TWICE DAILY 60 tablet 3   Skin Protectants, Misc. (EUCERIN) cream Apply 1 Application topically daily as needed for dry skin.     tacrolimus (PROTOPIC) 0.03 % ointment Apply 1 Application topically 2 (two) times daily as needed (rash). (Patient taking differently: Apply 1 Application topically 2 (two) times daily as needed (recurrent rash).) 60 g 5   triamcinolone ointment (KENALOG) 0.1 % Apply 1 Application topically at bedtime. 30 g 5   budesonide (PULMICORT) 0.5 MG/2ML nebulizer solution Take 0.5 mg by nebulization daily. (Patient not taking: Reported on 08/30/2023)     hydrOXYzine (ATARAX) 25 MG tablet Take 25 mg by mouth 2 (two) times daily. (Patient not taking: Reported on 08/30/2023)     hydrOXYzine (VISTARIL) 25 MG capsule Take 25 mg by mouth 2 (two) times daily as needed. (Patient not taking: Reported on 08/30/2023)     irbesartan (AVAPRO) 75 MG tablet TAKE 1 TABLET(75 MG) BY MOUTH DAILY (Patient not taking: Reported on 08/30/2023) 90 tablet 1   Current Facility-Administered Medications on File Prior to Visit  Medication Dose Route Frequency Provider Last Rate Last Admin   dupilumab (DUPIXENT) prefilled syringe 300 mg  300 mg  Subcutaneous Q14 Days Alfonse Spruce, MD   300 mg at 08/29/23 0906        ROS:  All others reviewed and negative.  Objective        PE:  BP 114/78 (BP Location: Left Arm, Patient Position: Sitting, Cuff Size: Large)   Pulse 90   Temp 98 F (36.7 C) (Oral)   Ht 5\' 3"  (1.6 m)   Wt 180 lb (81.6 kg)   SpO2 95%   BMI 31.89 kg/m                 Constitutional: Pt appears in NAD               HENT: Head: NCAT.                Right Ear: External ear normal.                 Left Ear: External ear normal.                Eyes: . Pupils are equal, round, and reactive to light. Conjunctivae and EOM are normal               Nose: without d/c or deformity               Neck: Neck supple. Gross normal ROM               Cardiovascular: Normal rate and regular rhythm.                 Pulmonary/Chest: Effort normal and breath sounds without rales or wheezing.                Abd:  Soft, NT, ND, + BS, no organomegaly               Neurological: Pt is alert. At baseline orientation, motor grossly intact               Skin: Skin is warm. No rashes, no other new lesions, LE edema - none  Psychiatric: Pt behavior is normal without agitation   Micro: none  Cardiac tracings I have personally interpreted today:  none  Pertinent Radiological findings (summarize): none   Lab Results  Component Value Date   WBC 20.7 (H) 08/17/2023   HGB 11.6 (L) 08/17/2023   HCT 36.8 08/17/2023   PLT 501 (H) 08/17/2023   GLUCOSE 135 (H) 08/17/2023   CHOL 204 (H) 06/18/2023   TRIG 82.0 06/18/2023   HDL 76.80 06/18/2023   LDLCALC 111 (H) 06/18/2023   ALT 139 (H) 08/15/2023   AST 41 08/15/2023   NA 135 08/17/2023   K 4.1 08/17/2023   CL 96 (L) 08/17/2023   CREATININE 0.81 08/17/2023   BUN 13 08/17/2023   CO2 25 08/17/2023   TSH 0.38 06/18/2023   INR 0.9 03/14/2023   HGBA1C 5.9 06/18/2023   MICROALBUR 1.0 06/18/2023   Assessment/Plan:  Joliyah Wojno is a 62 y.o. Black or  African American [2] female with  has a past medical history of Anemia, iron deficiency (12/22/2014), Arthritis, ASTHMA (05/12/2009), Bipolar disorder (HCC), COPD (chronic obstructive pulmonary disease) (HCC), Eczema (05/18/2022), Fibromyalgia (05/14/2014), GERD (gastroesophageal reflux disease), History of kidney stones, Hyperlipidemia (04/20/2017), HYPERTENSION (05/12/2009), Peripheral vascular disease (HCC), Pneumonia, Prediabetes (02/23/2014), Seizure (HCC), Stroke (HCC) (11/2020), and Urticaria.  COPD/asthma Overall stable, cont inhlaer and f/u pulm as planned  HTN (hypertension) BP Readings from Last 3 Encounters:  08/30/23 114/78  08/29/23 (!) 132/90  08/20/23 129/67   Stable, pt to continue medical treatment avapro 75 qd  Followup: Return in about 6 months (around 02/27/2024).  Oliver Barre, MD 09/01/2023 3:27 PM [facility]

## 2023-09-01 ENCOUNTER — Encounter: Payer: Self-pay | Admitting: Internal Medicine

## 2023-09-01 NOTE — Assessment & Plan Note (Signed)
Overall stable, cont inhlaer and f/u pulm as planned

## 2023-09-01 NOTE — Assessment & Plan Note (Signed)
BP Readings from Last 3 Encounters:  08/30/23 114/78  08/29/23 (!) 132/90  08/20/23 129/67   Stable, pt to continue medical treatment avapro 75 qd

## 2023-09-02 DIAGNOSIS — F319 Bipolar disorder, unspecified: Secondary | ICD-10-CM | POA: Diagnosis not present

## 2023-09-02 DIAGNOSIS — J441 Chronic obstructive pulmonary disease with (acute) exacerbation: Secondary | ICD-10-CM | POA: Diagnosis not present

## 2023-09-02 DIAGNOSIS — I700 Atherosclerosis of aorta: Secondary | ICD-10-CM | POA: Diagnosis not present

## 2023-09-02 DIAGNOSIS — I10 Essential (primary) hypertension: Secondary | ICD-10-CM | POA: Diagnosis not present

## 2023-09-02 DIAGNOSIS — U071 COVID-19: Secondary | ICD-10-CM | POA: Diagnosis not present

## 2023-09-02 DIAGNOSIS — G40909 Epilepsy, unspecified, not intractable, without status epilepticus: Secondary | ICD-10-CM | POA: Diagnosis not present

## 2023-09-02 DIAGNOSIS — G4733 Obstructive sleep apnea (adult) (pediatric): Secondary | ICD-10-CM | POA: Diagnosis not present

## 2023-09-02 DIAGNOSIS — M797 Fibromyalgia: Secondary | ICD-10-CM | POA: Diagnosis not present

## 2023-09-02 DIAGNOSIS — I739 Peripheral vascular disease, unspecified: Secondary | ICD-10-CM | POA: Diagnosis not present

## 2023-09-03 ENCOUNTER — Ambulatory Visit (HOSPITAL_COMMUNITY): Payer: Medicare HMO

## 2023-09-03 ENCOUNTER — Other Ambulatory Visit (HOSPITAL_COMMUNITY): Payer: Self-pay

## 2023-09-03 DIAGNOSIS — F6381 Intermittent explosive disorder: Secondary | ICD-10-CM | POA: Diagnosis not present

## 2023-09-03 DIAGNOSIS — F310 Bipolar disorder, current episode hypomanic: Secondary | ICD-10-CM | POA: Diagnosis not present

## 2023-09-04 ENCOUNTER — Telehealth: Payer: Self-pay

## 2023-09-04 NOTE — Telephone Encounter (Signed)
-----   Message from Alfonse Spruce sent at 09/03/2023  8:15 AM EDT ----- Urology referral placed.

## 2023-09-04 NOTE — Telephone Encounter (Signed)
Referral has been placed to [facility] at [facility]. MyChart message sent to patient regarding her referral being placed.

## 2023-09-04 NOTE — Telephone Encounter (Signed)
Thanks, [place]! rh

## 2023-09-05 ENCOUNTER — Ambulatory Visit (HOSPITAL_COMMUNITY): Payer: Medicare HMO

## 2023-09-05 ENCOUNTER — Telehealth: Payer: Self-pay

## 2023-09-05 DIAGNOSIS — I10 Essential (primary) hypertension: Secondary | ICD-10-CM | POA: Diagnosis not present

## 2023-09-05 DIAGNOSIS — J45909 Unspecified asthma, uncomplicated: Secondary | ICD-10-CM | POA: Diagnosis not present

## 2023-09-05 DIAGNOSIS — I739 Peripheral vascular disease, unspecified: Secondary | ICD-10-CM | POA: Diagnosis not present

## 2023-09-05 DIAGNOSIS — I700 Atherosclerosis of aorta: Secondary | ICD-10-CM | POA: Diagnosis not present

## 2023-09-05 DIAGNOSIS — J441 Chronic obstructive pulmonary disease with (acute) exacerbation: Secondary | ICD-10-CM | POA: Diagnosis not present

## 2023-09-05 DIAGNOSIS — U071 COVID-19: Secondary | ICD-10-CM | POA: Diagnosis not present

## 2023-09-05 DIAGNOSIS — G40909 Epilepsy, unspecified, not intractable, without status epilepticus: Secondary | ICD-10-CM | POA: Diagnosis not present

## 2023-09-05 DIAGNOSIS — G4733 Obstructive sleep apnea (adult) (pediatric): Secondary | ICD-10-CM | POA: Diagnosis not present

## 2023-09-05 DIAGNOSIS — M797 Fibromyalgia: Secondary | ICD-10-CM | POA: Diagnosis not present

## 2023-09-05 DIAGNOSIS — F319 Bipolar disorder, unspecified: Secondary | ICD-10-CM | POA: Diagnosis not present

## 2023-09-05 DIAGNOSIS — K219 Gastro-esophageal reflux disease without esophagitis: Secondary | ICD-10-CM | POA: Diagnosis not present

## 2023-09-05 NOTE — Telephone Encounter (Signed)
Per fax received from [facility] - DOB verified -   Epinastine 0.05% Ophth Sol  is not covered by patient plan.  Preferred alternative:  Olopatadine HCL Cromolyn Sodium  Forwarding message to provider for next step.

## 2023-09-06 MED ORDER — OLOPATADINE HCL 0.1 % OP SOLN: 1.0000 [drp] | Freq: Two times a day (BID) | OPHTHALMIC | 12 refills | Status: DC

## 2023-09-06 NOTE — Telephone Encounter (Signed)
I sent in alternative. Please inform the patient.

## 2023-09-06 NOTE — Addendum Note (Signed)
Addended by: Alfonse Spruce on: 09/06/2023 01:44 PM   Modules accepted: Orders

## 2023-09-06 NOTE — Telephone Encounter (Signed)
Called patient and advised of change in eyedrop. Patient verbalized understanding.

## 2023-09-10 ENCOUNTER — Encounter: Payer: Self-pay | Admitting: Adult Health

## 2023-09-10 ENCOUNTER — Ambulatory Visit (HOSPITAL_COMMUNITY): Payer: Medicare HMO

## 2023-09-10 ENCOUNTER — Telehealth: Payer: Self-pay | Admitting: Nurse Practitioner

## 2023-09-10 ENCOUNTER — Ambulatory Visit: Payer: Self-pay

## 2023-09-10 ENCOUNTER — Telehealth: Payer: Medicare HMO | Admitting: Adult Health

## 2023-09-10 DIAGNOSIS — J309 Allergic rhinitis, unspecified: Secondary | ICD-10-CM | POA: Diagnosis not present

## 2023-09-10 DIAGNOSIS — G4733 Obstructive sleep apnea (adult) (pediatric): Secondary | ICD-10-CM | POA: Diagnosis not present

## 2023-09-10 DIAGNOSIS — G40909 Epilepsy, unspecified, not intractable, without status epilepticus: Secondary | ICD-10-CM | POA: Diagnosis not present

## 2023-09-10 DIAGNOSIS — U071 COVID-19: Secondary | ICD-10-CM | POA: Diagnosis not present

## 2023-09-10 DIAGNOSIS — K219 Gastro-esophageal reflux disease without esophagitis: Secondary | ICD-10-CM | POA: Diagnosis not present

## 2023-09-10 DIAGNOSIS — I10 Essential (primary) hypertension: Secondary | ICD-10-CM | POA: Diagnosis not present

## 2023-09-10 DIAGNOSIS — M797 Fibromyalgia: Secondary | ICD-10-CM | POA: Diagnosis not present

## 2023-09-10 DIAGNOSIS — I739 Peripheral vascular disease, unspecified: Secondary | ICD-10-CM | POA: Diagnosis not present

## 2023-09-10 DIAGNOSIS — J441 Chronic obstructive pulmonary disease with (acute) exacerbation: Secondary | ICD-10-CM | POA: Diagnosis not present

## 2023-09-10 DIAGNOSIS — F319 Bipolar disorder, unspecified: Secondary | ICD-10-CM | POA: Diagnosis not present

## 2023-09-10 DIAGNOSIS — I700 Atherosclerosis of aorta: Secondary | ICD-10-CM | POA: Diagnosis not present

## 2023-09-10 NOTE — Patient Instructions (Signed)
Visit Information  Thank you for taking time to visit with me today. Please don't hesitate to contact me if I can be of assistance to you.   Following are the goals we discussed today:  Continue to take medications as prescribed. Continue to attend provider visits as scheduled [employer] company to see if you can obtain a pulse oximeter as a benefit through them. Await provider return call. Seek medical attention of condition worsens   Our next appointment is by telephone on 09/16/23 at 2:30 pm  Please call the care guide team at [phone] if you need to cancel or reschedule your appointment.   If you are experiencing a Mental Health or Behavioral Health Crisis or need someone to talk to, please call the Suicide and Crisis Lifeline: 988 call the [place] National Suicide Prevention Lifeline: 5033084553 or TTY: 785-373-1677 TTY 251-196-6198) to talk to a trained counselor call 1-800-273-TALK (toll free, 24 hour hotline)  Kathyrn Sheriff, RN, MSN, BSN, CCM Care Management Coordinator [phone]

## 2023-09-10 NOTE — Patient Outreach (Signed)
Care Coordination   Follow Up Visit Note   09/10/2023 Name: [name] MRN: [id number] DOB: July 31, 1961  [name] is a 62 y.o. year old female who sees Corwin Levins, MD for primary care. I spoke with  [name] by phone today.  What matters to the patients health and wellness today?  Per [name], she is awaiting a return call from pulmonologist office. She reports feeling a little more SOB this morning than usual and has been since the weekend. Per chart review-pulmonologist has been contacted and patient is awaiting a return call. She reports she remains active with [facility] and states the Home health nurse was just out today and called pulmonologist office. Patient reports she does not have a pulse oximeter. Recent hospitalization 08/11/23-08/20/23 for  Covid infection/acute exacerbation of COPD.  Goals Addressed             This Visit's Progress    Support with Health managment education       Interventions Today    Flowsheet Row Most Recent Value  Chronic Disease   Chronic disease during today's visit Chronic Obstructive Pulmonary Disease (COPD)  General Interventions   General Interventions Discussed/Reviewed General Interventions Reviewed  Doctor Visits Discussed/Reviewed Doctor Visits Reviewed  PCP/Specialist Visits Compliance with follow-up visit  [reviewed upcoming follow up appointments]  Education Interventions   Education Provided Provided Education  Provided Verbal Education On [employer], Other, When to see the doctor, Medication  [facility] encouraged patient to follow up with insurance to see if she is able to get pulse oximeter as a benefit through them. advised continue to take medications, attend provider visits. seek medical attention if condition worsens.]  Pharmacy Interventions   Pharmacy Dicussed/Reviewed Pharmacy Topics Reviewed  [medications reviewed, emphasized medications prescribed for  breathing. referral to clinical pharmacy re: polypharmacy]           SDOH assessments and interventions completed:  No  Care Coordination Interventions:  Yes, provided   Follow up plan: Follow up call scheduled for 09/16/23    Encounter Outcome:  Patient Visit Completed    Kathyrn Sheriff, RN, MSN, BSN, CCM Care Management Coordinator [phone]

## 2023-09-10 NOTE — Patient Instructions (Signed)
Continue on Breztri 2 puffs twice daily, rinse after use Continue on Dupixent every 2 weeks as planned Albuterol/DuoNeb as needed Continue on Singulair, Flonase and Zyrtec daily Activity as tolerated Follow-up with Dr. Delton Coombes in 4 weeks as planned and as needed

## 2023-09-10 NOTE — Telephone Encounter (Signed)
Sob and chest tightness off and on since Friday with exertion.  Using nebs and breztri and gets results with prn meds.   Does not wear oxygen and has no way to check oxygen level.  Denies any fever, chills or body aches.  Arranged for a video visit today with Tammy Parrett NP at 4 pm.  She verbalized understanding.  Nothing further needed.

## 2023-09-10 NOTE — Telephone Encounter (Signed)
Kely states patient having shortness of breath and chest tightness. Pharmacy is [facility] [address] Kelly phone number is [phone]. Please call patient phone number is [phone].

## 2023-09-10 NOTE — Progress Notes (Signed)
Virtual Visit via Video Note  I connected with [name] on 09/10/23 at  4:00 PM EDT by a video enabled telemedicine application and verified that I am speaking with the correct person using two identifiers.  Location: Patient: Home  Provider: Office    I discussed the limitations of evaluation and management by telemedicine and the availability of in person appointments. The patient expressed understanding and agreed to proceed.  History of Present Illness: 62 year old female former smoker followed for COPD asthma overlap Medical history significant for seizure disorder, stroke, bipolar disorder  Today's televisit is a post hospital follow up . She is followed for severe COPD with asthma overlap. Remains on Breztri Twice daily , endorces compliance  Is on Dupixent injections every 2 weeks , Last shot was 4 days ago. Remains on Singulair daily . Using Duoneb 2-3 times  a day.  No chest pain, hemoptysis, edema , calf pain, or fever. Eating well with no n/v/d. She is not on O2 at home. Does not check her O2 sats at home.  Recently admitted earlier this month for an acute exacerbation of COPD in the setting of COVID-19 infection.  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 Chest xray was clear during hospital stay.  Since discharge she is doing better with less cough and congestion. Cough is minimally productive. Gets winded easily. Is sedentary.  Seen by ENT 09/05/23 noted normal vocal cords movement . Felt possible LPR/GERD . Referred to GI. Continue on PPI and Pepcid.   . Past Medical History:  Diagnosis Date   Anemia, iron deficiency 12/22/2014   pt. 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       Bipolar disorder (HCC)    with anxiety, depression   COPD (chronic obstructive pulmonary disease) (HCC)    Eczema 05/18/2022   Fibromyalgia 05/14/2014   GERD (gastroesophageal reflux disease)    History of kidney stones    Hyperlipidemia 04/20/2017   HYPERTENSION 05/12/2009   Qualifier: Diagnosis of  By: Truman Hayward Duncan Dull), [name]     Peripheral vascular disease (HCC)    Pneumonia    Prediabetes 02/23/2014   pt. denies   Seizure (HCC)    Stroke (HCC) 11/2020   Urticaria     Current Outpatient Medications on File Prior to Visit  Medication Sig Dispense Refill   albuterol (VENTOLIN HFA) 108 (90 Base) MCG/ACT inhaler Inhale 2 puffs into the lungs every 6 (six) hours as needed for wheezing or shortness of breath. 18 g 1   ARIPiprazole (ABILIFY) 10 MG tablet Take 1 tablet (10 mg total) by mouth daily. Take Abilify 20 mg po daily for 2 weeks till 9/17 , then continue taking 10 mg po daily 30 tablet 1   atorvastatin (LIPITOR) 40 MG tablet Take 1 tablet (40 mg total) by mouth daily. TAKE 1 TABLET(40 MG) BY MOUTH DAILY Strength: 40 mg 90 tablet 3   azelastine (OPTIVAR) 0.05 % ophthalmic solution Place 1 drop into both eyes 2 (two) times daily. 6 mL 12   Budeson-Glycopyrrol-Formoterol (BREZTRI AEROSPHERE) 160-9-4.8 MCG/ACT AERO Inhale 2 puffs into the lungs in the morning and at bedtime. 10.7 g 11   budesonide (PULMICORT) 0.5 MG/2ML nebulizer solution Take 0.5 mg by nebulization daily.  carbamazepine (TEGRETOL) 200 MG tablet Take 2-3 tablets (400-600 mg total) by mouth See admin instructions. Take 400mg  in the morning and 600mg  at bedtime. 150 tablet 2   cetirizine (ZYRTEC) 10 MG tablet Can take one tablet by mouth once daily if needed for runny nose. 30 tablet 5   dupilumab (DUPIXENT) 300 MG/2ML prefilled syringe Inject 300 mg into the skin every 14 (fourteen) days. 12 mL 3   Epinastine HCl 0.05 % ophthalmic solution Place 1 drop into both eyes 2 (two) times daily. 10 mL 12   famotidine  (PEPCID) 40 MG tablet TAKE 1 TABLET(40 MG) BY MOUTH AT BEDTIME 30 tablet 3   ferrous sulfate 325 (65 FE) MG EC tablet Take 1 tablet (325 mg total) by mouth daily with breakfast. 90 tablet 1   fluticasone (FLONASE) 50 MCG/ACT nasal spray Place 1 spray into both nostrils 2 (two) times daily as needed for allergies or rhinitis. 16 g 5   formoterol (PERFOROMIST) 20 MCG/2ML nebulizer solution Take 20 mcg by nebulization 2 (two) times daily.     furosemide (LASIX) 20 MG tablet 1 tab by mouth once daily as needed (Patient taking differently: Take 20 mg by mouth daily as needed for edema.) 30 tablet 5   hydrOXYzine (ATARAX) 25 MG tablet Take 25 mg by mouth 2 (two) times daily.     hydrOXYzine (VISTARIL) 25 MG capsule Take 25 mg by mouth 2 (two) times daily as needed.     ipratropium (ATROVENT HFA) 17 MCG/ACT inhaler 2 puffs every 4-6 hours as needed. 12.9 g 5   ipratropium-albuterol (DUONEB) 0.5-2.5 (3) MG/3ML SOLN Take 3 mLs by nebulization 2 (two) times daily. 360 mL 3   irbesartan (AVAPRO) 75 MG tablet TAKE 1 TABLET(75 MG) BY MOUTH DAILY 90 tablet 1   lactulose (CHRONULAC) 10 GM/15ML solution Take 15 mLs (10 g total) by mouth 2 (two) times daily. 500 mL 0   montelukast (SINGULAIR) 10 MG tablet Take 1 tablet (10 mg total) by mouth at bedtime. 90 tablet 1   nystatin (MYCOSTATIN) 100000 UNIT/ML suspension Take 5 mLs (500,000 Units total) by mouth 4 (four) times daily. 473 mL 1   olopatadine (PATANOL) 0.1 % ophthalmic solution Place 1 drop into both eyes 2 (two) times daily. 5 mL 12   pantoprazole (PROTONIX) 40 MG tablet TAKE 1 TABLET(40 MG) BY MOUTH TWICE DAILY 60 tablet 3   Skin Protectants, Misc. (EUCERIN) cream Apply 1 Application topically daily as needed for dry skin.     tacrolimus (PROTOPIC) 0.03 % ointment Apply 1 Application topically 2 (two) times daily as needed (rash). (Patient taking differently: Apply 1 Application topically 2 (two) times daily as needed (recurrent rash).) 60 g 5    triamcinolone ointment (KENALOG) 0.1 % Apply 1 Application topically at bedtime. 30 g 5   Current Facility-Administered Medications on File Prior to Visit  Medication Dose Route Frequency Provider Last Rate Last Admin   dupilumab (DUPIXENT) prefilled syringe 300 mg  300 mg Subcutaneous Q14 Days Alfonse Spruce, MD   300 mg at 08/29/23 0906      Observations/Objective: 06/10/2020 echocardiogram: EF 60-65%; diastolic parameters normal. RV size and function nl. Unable to measure PASP. Trivial MR.  03/08/2022 CT chest wo contrast: atherosclerosis. No LAD. Emphysematous disease is noted b/l. No suspicious nodules mentioned.  05/28/2022 CXR 1 view: lungs are mildly hyperinflated, progressive since previous. Lungs are clear with no acute process.  12/05/2022 PFT: FVC 45, FEV1 28, ratio 52, TLC 107, DLCOcor 49.  Positive BD (27%) 02/22/2023 CT chest wo con: atherosclerosis. No LAD. No acute process. Diffuse bronchial wall thickening with mild to moderate emphysema. Underlying cirrhosis.  02/24/2023 echo: EF 60-65%. Normal diastolic parameters. RV size and function nl. Nl PASP.   Assessment and Plan: Severe COPD with asthma overlap -recent exacerbation in the setting of Covid 19 infection. Improving . She has very low lung function with FEV1 at 28%. (Post BD FEV1 36%) -she is at high risk for decompensation. Continue on aggressive maintenance regimen with Breztri, Singulair , Dupixent and BD inhaler/neb.   Covid 19 infection - resolved   3. Chronic Allergies-continue on Zyrtec and Flonase   4. LPR/GERD -continue on PPI/Pepcid , follow up with GI as planned.   Plan  Patient Instructions  Continue on Breztri 2 puffs twice daily, rinse after use Continue on Dupixent every 2 weeks as planned Albuterol/DuoNeb as needed Continue on Singulair, Flonase and Zyrtec daily Activity as tolerated Follow-up with Dr. Delton Coombes in 4 weeks as planned and as needed      Follow Up Instructions:    I discussed  the assessment and treatment plan with the patient. The patient was provided an opportunity to ask questions and all were answered. The patient agreed with the plan and demonstrated an understanding of the instructions.   The patient was advised to call back or seek an in-person evaluation if the symptoms worsen or if the condition fails to improve as anticipated.  I provided 30  minutes of non-face-to-face time during this encounter.   Rubye Oaks, NP

## 2023-09-11 DIAGNOSIS — F6381 Intermittent explosive disorder: Secondary | ICD-10-CM | POA: Diagnosis not present

## 2023-09-12 ENCOUNTER — Ambulatory Visit (HOSPITAL_COMMUNITY): Payer: Medicare HMO

## 2023-09-12 DIAGNOSIS — M797 Fibromyalgia: Secondary | ICD-10-CM | POA: Diagnosis not present

## 2023-09-12 DIAGNOSIS — U071 COVID-19: Secondary | ICD-10-CM | POA: Diagnosis not present

## 2023-09-12 DIAGNOSIS — G40909 Epilepsy, unspecified, not intractable, without status epilepticus: Secondary | ICD-10-CM | POA: Diagnosis not present

## 2023-09-12 DIAGNOSIS — I10 Essential (primary) hypertension: Secondary | ICD-10-CM | POA: Diagnosis not present

## 2023-09-12 DIAGNOSIS — I739 Peripheral vascular disease, unspecified: Secondary | ICD-10-CM | POA: Diagnosis not present

## 2023-09-12 DIAGNOSIS — I700 Atherosclerosis of aorta: Secondary | ICD-10-CM | POA: Diagnosis not present

## 2023-09-12 DIAGNOSIS — G4733 Obstructive sleep apnea (adult) (pediatric): Secondary | ICD-10-CM | POA: Diagnosis not present

## 2023-09-12 DIAGNOSIS — J441 Chronic obstructive pulmonary disease with (acute) exacerbation: Secondary | ICD-10-CM | POA: Diagnosis not present

## 2023-09-12 DIAGNOSIS — F319 Bipolar disorder, unspecified: Secondary | ICD-10-CM | POA: Diagnosis not present

## 2023-09-13 ENCOUNTER — Ambulatory Visit (INDEPENDENT_AMBULATORY_CARE_PROVIDER_SITE_OTHER): Payer: Medicare HMO

## 2023-09-13 DIAGNOSIS — J4550 Severe persistent asthma, uncomplicated: Secondary | ICD-10-CM | POA: Diagnosis not present

## 2023-09-16 ENCOUNTER — Ambulatory Visit: Payer: Self-pay

## 2023-09-16 NOTE — Patient Outreach (Signed)
Care Coordination   Follow Up Visit Note   09/16/2023 Name: [name] MRN: [id number] DOB: Oct 19, 1961  [name] is a 62 y.o. year old female who sees Corwin Levins, MD for primary care. I spoke with  [name] by phone today.  What matters to the patients health and wellness today?  [name] reports video visit completed with pulmonologist on 09/10/23. She states she feels about the same. No audible wheezing or SOB noted on the phone. She reports she remains active with home health PT. She confirms upcoming appointment with Dr. Olena Leatherwood, urogyn, tomorrow and confirms she has transportation. Upcoming appointment with Dr. Delton Coombes, pulmonology 10/09/23. She is without questions today.   Goals Addressed             This Visit's Progress    Support with Health managment education       Interventions Today    Flowsheet Row Most Recent Value  Chronic Disease   Chronic disease during today's visit Chronic Obstructive Pulmonary Disease (COPD), Other  [admitted for COVID/COPD exacerbation 08/11/23-08/20/23.]  General Interventions   General Interventions Discussed/Reviewed General Interventions Reviewed, Doctor Visits  Doctor Visits Discussed/Reviewed Doctor Visits Reviewed, Specialist  PCP/Specialist Visits Compliance with follow-up visit  [reviewed upcoming/scheduled appointments]  Exercise Interventions   Exercise Discussed/Reviewed Exercise Reviewed  [advised to continue to perform exercises as recommended by home health therapist.]  Education Interventions   Education Provided Provided Education  Provided Verbal Education On Other, When to see the doctor, Medication, Exercise  [advised to take medications as prescribed, attend provider visits as scheduled, cotnact provider with health questions or concerns or if condition worsens.]  Pharmacy Interventions   Pharmacy Dicussed/Reviewed Pharmacy Topics Reviewed, Referral to Pharmacist   [polypharmacy]           SDOH assessments and interventions completed:  No  Care Coordination Interventions:  Yes, provided   Follow up plan: Follow up call scheduled for 10/16/23    Encounter Outcome:  Patient Visit Completed   Kathyrn Sheriff, RN, MSN, BSN, CCM Care Management Coordinator [phone]

## 2023-09-16 NOTE — Patient Instructions (Signed)
Visit Information  Thank you for taking time to visit with me today. Please don't hesitate to contact me if I can be of assistance to you.   Following are the goals we discussed today:  Continue to take medications as prescribed. Continue to attend provider visits as scheduled Continue to eat healthy, lean meats, vegetables, fruits, avoid saturated and transfats Contact provider with health questions/concerns or worsening condition  Our next appointment is by telephone on 10/16/23 at 2:00 pm  Please call the care guide team at [phone] if you need to cancel or reschedule your appointment.   If you are experiencing a Mental Health or Behavioral Health Crisis or need someone to talk to, please call the Suicide and Crisis Lifeline: 988 call the [place] National Suicide Prevention Lifeline: 954-739-0480 or TTY: (201)114-0960 TTY 707-249-5531) to talk to a trained counselor call 1-800-273-TALK (toll free, 24 hour hotline)  Kathyrn Sheriff, RN, MSN, BSN, CCM Care Management Coordinator [phone]

## 2023-09-17 ENCOUNTER — Encounter: Payer: Self-pay | Admitting: Obstetrics

## 2023-09-17 ENCOUNTER — Ambulatory Visit (INDEPENDENT_AMBULATORY_CARE_PROVIDER_SITE_OTHER): Payer: Medicare HMO | Admitting: Obstetrics

## 2023-09-17 VITALS — BP 125/84 | HR 82 | Ht 61.73 in | Wt 182.4 lb

## 2023-09-17 DIAGNOSIS — R351 Nocturia: Secondary | ICD-10-CM | POA: Insufficient documentation

## 2023-09-17 DIAGNOSIS — K5900 Constipation, unspecified: Secondary | ICD-10-CM | POA: Diagnosis not present

## 2023-09-17 DIAGNOSIS — R32 Unspecified urinary incontinence: Secondary | ICD-10-CM

## 2023-09-17 DIAGNOSIS — N3941 Urge incontinence: Secondary | ICD-10-CM

## 2023-09-17 DIAGNOSIS — J449 Chronic obstructive pulmonary disease, unspecified: Secondary | ICD-10-CM | POA: Diagnosis not present

## 2023-09-17 DIAGNOSIS — N941 Unspecified dyspareunia: Secondary | ICD-10-CM

## 2023-09-17 MED ORDER — GEMTESA 75 MG PO TABS
75.0000 mg | ORAL_TABLET | Freq: Every day | ORAL | Status: DC
Start: 2023-09-17 — End: 2023-09-26

## 2023-09-17 MED ORDER — GEMTESA 75 MG PO TABS
75.0000 mg | ORAL_TABLET | Freq: Every day | ORAL | 2 refills | Status: DC
Start: 2023-09-17 — End: 2023-12-30

## 2023-09-17 MED ORDER — ESTRADIOL 0.1 MG/GM VA CREA
0.5000 g | TOPICAL_CREAM | VAGINAL | 11 refills | Status: DC
Start: 2023-09-19 — End: 2024-03-13

## 2023-09-17 NOTE — Assessment & Plan Note (Deleted)
-   We discussed the symptoms of overactive bladder (OAB), which include urinary urgency, urinary frequency, nocturia, with or without urge incontinence.  While we do not know the exact etiology of OAB, several treatment options exist. We discussed management including behavioral therapy (decreasing bladder irritants, urge suppression strategies, timed voids, bladder retraining), physical therapy, medication; for refractory cases posterior tibial nerve stimulation, sacral neuromodulation, and intravesical botulinum toxin injection.  For anticholinergic medications, we discussed the potential side effects of anticholinergics including dry eyes, dry mouth, constipation, cognitive impairment and urinary retention. For Beta-3 agonist medication, we discussed the potential side effect of elevated blood pressure which is more likely to occur in individuals with uncontrolled hypertension. - continue pelvic floor PT - samples provided for trial of Gemtesa - bladder scan PVR 9mL WNL - repeat UA at follow-up - consider urodynamics if refractory symptoms due to history of CVA on imaging

## 2023-09-17 NOTE — Assessment & Plan Note (Addendum)
-   For constipation, we reviewed the importance of a better bowel regimen.  We also discussed the importance of avoiding chronic straining, as it can exacerbate her pelvic floor symptoms; we discussed treating constipation and straining prior to surgery, as postoperative straining can lead to damage to the repair and recurrence of symptoms. We discussed initiating therapy with increasing fluid intake, fiber supplementation, stool softeners, and laxatives such as miralax.  - encouraged to continue squatting position for bowel movement and minimize straining - encouraged fiber supplementation and miralax use, discontinue stool softener - can be exacerbated by iron supplementation due to history of anemia

## 2023-09-17 NOTE — Assessment & Plan Note (Signed)
-   continue glycemic control - encouraged compression socks for LE edema - avoids fluid intake after dinner - continue CPAP use for OSA - switch lasix dosing to 2pm

## 2023-09-17 NOTE — Assessment & Plan Note (Signed)
-   left sided pelvic floor myofascial pain and right sided vulvar pain with palpation, increased pelvic floor muscle tone with inadequate relaxation - encouraged to continue pelvic floor PT with emphasis on relaxation - optimize stool consistency to decrease straining - encouraged position change and lubrication use - start vaginal estrogen for vaginal atrophy

## 2023-09-17 NOTE — Patient Instructions (Addendum)
We discussed the symptoms of overactive bladder (OAB), which include urinary urgency, urinary frequency, night-time urination, with or without urge incontinence.  We discussed management including behavioral therapy (decreasing bladder irritants by following a bladder diet, urge suppression strategies, timed voids, bladder retraining), physical therapy, medication; and for refractory cases posterior tibial nerve stimulation, sacral neuromodulation, and intravesical botulinum toxin injection.   For Beta-3 agonist medication, we discussed the potential side effect of elevated blood pressure which is more likely to occur in individuals with uncontrolled hypertension. You were given samples for Gemtesa 75mg .  It can take a month to start working so give it time, but if you have bothersome side effects call sooner and we can try a different medication.  Call us if you have trouble filling the prescription or if it's not covered by your insurance.  Switch your water pill to early afternoon to reassess night time urinary frequency - limit water intake after 6pm - start compression socks during the day for leg swelling - continue CPAP use for sleep apnea  For constipation, we reviewed the importance of a better bowel regimen.  We also discussed the importance of avoiding chronic straining, as it can exacerbate her pelvic floor symptoms; we discussed treating constipation and straining prior to surgery, as postoperative straining can lead to damage to the repair and recurrence of symptoms. We discussed initiating therapy with increasing fluid intake, fiber supplementation, stool softeners, and laxatives such as miralax.   For symptomatic vaginal atrophy options include lubrication with a water-based lubricant, personal hygiene measures and barrier protection against wetness, and estrogen replacement in the form of vaginal cream, vaginal tablets, or a time-released vaginal ring.     Start vaginal estrogen  therapy nightly for two weeks then 2 times weekly at night for treatment of vaginal atrophy (dryness of the vaginal tissues).  Please let us know if the prescription is too expensive and we can look for alternative options.    The origin of pelvic floor muscle spasm can be multifactorial, including primary, reactive to a different pain source, trauma, or even part of a centralized pain syndrome.Treatment options include pelvic floor physical therapy, local (vaginal) or oral  muscle relaxants, pelvic muscle trigger point injections or centrally acting pain medications.

## 2023-09-17 NOTE — Assessment & Plan Note (Signed)
-   We discussed the symptoms of overactive bladder (OAB), which include urinary urgency, urinary frequency, nocturia, with or without urge incontinence.  While we do not know the exact etiology of OAB, several treatment options exist. We discussed management including behavioral therapy (decreasing bladder irritants, urge suppression strategies, timed voids, bladder retraining), physical therapy, medication; for refractory cases posterior tibial nerve stimulation, sacral neuromodulation, and intravesical botulinum toxin injection.  For anticholinergic medications, we discussed the potential side effects of anticholinergics including dry eyes, dry mouth, constipation, cognitive impairment and urinary retention. For Beta-3 agonist medication, we discussed the potential side effect of elevated blood pressure which is more likely to occur in individuals with uncontrolled hypertension. - continue pelvic floor PT - samples provided for trial of Gemtesa - bladder scan PVR 9mL WNL - repeat UA at follow-up - consider urodynamics if refractory symptoms due to history of CVA on imaging  - exacerbation of urinary symptoms with respiratory symptoms, reassess after respiratory symptoms improves since prior improvement after discontinuation of steroids
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
## 2023-09-18 ENCOUNTER — Telehealth: Payer: Self-pay

## 2023-09-18 DIAGNOSIS — U071 COVID-19: Secondary | ICD-10-CM | POA: Diagnosis not present

## 2023-09-18 DIAGNOSIS — M797 Fibromyalgia: Secondary | ICD-10-CM | POA: Diagnosis not present

## 2023-09-18 DIAGNOSIS — I10 Essential (primary) hypertension: Secondary | ICD-10-CM | POA: Diagnosis not present

## 2023-09-18 DIAGNOSIS — G40909 Epilepsy, unspecified, not intractable, without status epilepticus: Secondary | ICD-10-CM | POA: Diagnosis not present

## 2023-09-18 DIAGNOSIS — G4733 Obstructive sleep apnea (adult) (pediatric): Secondary | ICD-10-CM | POA: Diagnosis not present

## 2023-09-18 DIAGNOSIS — I739 Peripheral vascular disease, unspecified: Secondary | ICD-10-CM | POA: Diagnosis not present

## 2023-09-18 DIAGNOSIS — I700 Atherosclerosis of aorta: Secondary | ICD-10-CM | POA: Diagnosis not present

## 2023-09-18 DIAGNOSIS — F319 Bipolar disorder, unspecified: Secondary | ICD-10-CM | POA: Diagnosis not present

## 2023-09-18 DIAGNOSIS — J441 Chronic obstructive pulmonary disease with (acute) exacerbation: Secondary | ICD-10-CM | POA: Diagnosis not present

## 2023-09-18 NOTE — Progress Notes (Unsigned)
Care Guide Note  09/18/2023 Name: Harlequinn Kovalski MRN: [id number] DOB: 03-Oct-1961  Referred by: Corwin Levins, MD Reason for referral : Care Coordination (Outreach to schedule with Pharm d )   Crystallynn Mcgrogan is a 62 y.o. year old female who is a primary care patient of Corwin Levins, MD. Judson Roch Pedroza was referred to the pharmacist for assistance related to HTN and COPD.    An unsuccessful telephone outreach was attempted today to contact the patient who was referred to the pharmacy team for assistance with medication management. Additional attempts will be made to contact the patient.   Penne Lash, RMA Care Guide [facility]  [place], [place] [address] Direct Dial: [phone] [email or web address]

## 2023-09-19 DIAGNOSIS — I739 Peripheral vascular disease, unspecified: Secondary | ICD-10-CM | POA: Diagnosis not present

## 2023-09-19 DIAGNOSIS — F319 Bipolar disorder, unspecified: Secondary | ICD-10-CM | POA: Diagnosis not present

## 2023-09-19 DIAGNOSIS — G4733 Obstructive sleep apnea (adult) (pediatric): Secondary | ICD-10-CM | POA: Diagnosis not present

## 2023-09-19 DIAGNOSIS — I10 Essential (primary) hypertension: Secondary | ICD-10-CM | POA: Diagnosis not present

## 2023-09-19 DIAGNOSIS — I700 Atherosclerosis of aorta: Secondary | ICD-10-CM | POA: Diagnosis not present

## 2023-09-19 DIAGNOSIS — G40909 Epilepsy, unspecified, not intractable, without status epilepticus: Secondary | ICD-10-CM | POA: Diagnosis not present

## 2023-09-19 DIAGNOSIS — U071 COVID-19: Secondary | ICD-10-CM | POA: Diagnosis not present

## 2023-09-19 DIAGNOSIS — M797 Fibromyalgia: Secondary | ICD-10-CM | POA: Diagnosis not present

## 2023-09-19 DIAGNOSIS — J441 Chronic obstructive pulmonary disease with (acute) exacerbation: Secondary | ICD-10-CM | POA: Diagnosis not present

## 2023-09-20 NOTE — Progress Notes (Unsigned)
Care Guide Note  09/20/2023 Name: [name] MRN: [id number] DOB: [birth year]  Referred by: Corwin Levins, MD Reason for referral : Care Coordination (Outreach to schedule with Pharm d )   [name] is a 62 y.o. year old female who is a primary care patient of Corwin Levins, MD. [name] was referred to the pharmacist for assistance related to HTN and COPD.    A second unsuccessful telephone outreach was attempted today to contact the patient who was referred to the pharmacy team for assistance with medication management. Additional attempts will be made to contact the patient.  Penne Lash, RMA Care Guide [facility]  [place], [place] [address] Direct Dial: [phone] [email or web address]

## 2023-09-25 NOTE — Progress Notes (Signed)
Care Guide Note  09/25/2023 Name: Shawanna Wehner MRN: [id number] DOB: 1961-03-07  Referred by: Corwin Levins, MD Reason for referral : Care Coordination (Outreach to schedule with Pharm d )   Lavere Halford is a 62 y.o. year old female who is a primary care patient of Corwin Levins, MD. Judson Roch Martine was referred to the pharmacist for assistance related to HTN and COPD.    Successful contact was made with the patient to discuss pharmacy services including being ready for the pharmacist to call at least 5 minutes before the scheduled appointment time, to have medication bottles and any blood sugar or blood pressure readings ready for review. The patient agreed to meet with the pharmacist via with the pharmacist via telephone visit on (date/time).  09/30/2023  Penne Lash, RMA Care Guide [facility]  [place], [place] [address] Direct Dial: [phone] [email or web address]

## 2023-09-26 ENCOUNTER — Encounter: Payer: Self-pay | Admitting: Physician Assistant

## 2023-09-26 ENCOUNTER — Ambulatory Visit (INDEPENDENT_AMBULATORY_CARE_PROVIDER_SITE_OTHER): Payer: Medicare HMO | Admitting: Physician Assistant

## 2023-09-26 VITALS — BP 134/80 | HR 80 | Ht 62.0 in | Wt 183.4 lb

## 2023-09-26 DIAGNOSIS — K219 Gastro-esophageal reflux disease without esophagitis: Secondary | ICD-10-CM | POA: Diagnosis not present

## 2023-09-26 DIAGNOSIS — J45909 Unspecified asthma, uncomplicated: Secondary | ICD-10-CM | POA: Diagnosis not present

## 2023-09-26 MED ORDER — FAMOTIDINE 40 MG PO TABS: 40.0000 mg | ORAL_TABLET | Freq: Two times a day (BID) | ORAL | 5 refills | Status: DC
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
## 2023-09-26 NOTE — Patient Instructions (Signed)
You have been scheduled for an endoscopy. Please follow written instructions given to you at your visit today.  If you use inhalers (even only as needed), please bring them with you on the day of your procedure.  If you take any of the following medications, they will need to be adjusted prior to your procedure:   DO NOT TAKE 7 DAYS PRIOR TO TEST- Trulicity (dulaglutide) Ozempic, Wegovy (semaglutide) Mounjaro (tirzepatide) Bydureon Bcise (exanatide extended release)  DO NOT TAKE 1 DAY PRIOR TO YOUR TEST Rybelsus (semaglutide) Adlyxin (lixisenatide) Victoza (liraglutide) Byetta (exanatide)  _______________________________________________________  If your blood pressure at your visit was 140/90 or greater, please contact your primary care physician to follow up on this.  _______________________________________________________  If you are age 38 or older, your body mass index should be between 23-30. Your Body mass index is 33.54 kg/m. If this is out of the aforementioned range listed, please consider follow up with your Primary Care Provider.  If you are age 70 or younger, your body mass index should be between 19-25. Your Body mass index is 33.54 kg/m. If this is out of the aformentioned range listed, please consider follow up with your Primary Care Provider.   ________________________________________________________  The [facility] providers would like to encourage you to use [facility] to communicate with providers for non-urgent requests or questions.  Due to long hold times on the telephone, sending your provider a message by [facility] may be a faster and more efficient way to get a response.  Please allow 48 business hours for a response.  Please remember that this is for non-urgent requests.   It was a pleasure to see you today!  Thank you for trusting me with your gastrointestinal care!    Hyacinth Meeker, PA-C   ___________________________________________________________________________

## 2023-09-27 ENCOUNTER — Encounter: Payer: Self-pay | Admitting: Internal Medicine

## 2023-09-27 ENCOUNTER — Ambulatory Visit: Payer: Medicare HMO | Admitting: *Deleted

## 2023-09-27 DIAGNOSIS — J4550 Severe persistent asthma, uncomplicated: Secondary | ICD-10-CM | POA: Diagnosis not present

## 2023-09-27 MED ORDER — ONDANSETRON 4 MG PO TBDP: 4.0000 mg | ORAL_TABLET | Freq: Three times a day (TID) | ORAL | 1 refills | Status: DC | PRN

## 2023-09-29 NOTE — Progress Notes (Signed)
____________________________________________________________  Attending physician addendum:  Thank you for sending this case to me. I have reviewed the entire note and agree with the plan.  She unquestionably needs extensive evaluation by pulmonary for her asthma.  Not withstanding ENT's diagnosis of LPR and suggestion that reflux is contributing to her asthma, we may not be able to determine the extent to which any demonstrable reflux is contributing to this patient's asthma, even if she does go so far as pH and impedance testing along with manometry.  Nevertheless, we will pursue further workup along with her pulmonary evaluation.  Amada Jupiter, MD  ____________________________________________________________

## 2023-09-30 ENCOUNTER — Other Ambulatory Visit: Payer: Medicare HMO | Admitting: Pharmacist

## 2023-09-30 DIAGNOSIS — H1010 Acute atopic conjunctivitis, unspecified eye: Secondary | ICD-10-CM

## 2023-09-30 DIAGNOSIS — J441 Chronic obstructive pulmonary disease with (acute) exacerbation: Secondary | ICD-10-CM

## 2023-09-30 NOTE — Progress Notes (Signed)
09/30/2023 Name: [name] MRN: [id number] DOB: 03/10/1961  Chief Complaint  Patient presents with   Medication Management    [name] is a 62 y.o. year old female who presented for a telephone visit.   They were referred to the pharmacist by their Case Management Team  for assistance in managing complex medication management.    Subjective:  Care Team: Primary Care Provider: Corwin Levins, MD ; Next Scheduled Visit: 12/23/2023 Pulmonologist: Dr. Delton Coombes, next appt 10/09/23  Medication Access/Adherence  Current Pharmacy:  [id number] [place], [place] - [address] AT [facility] OF [address] & [address] [place] [address] Phone: [phone] Fax: [phone]  [facility] - [place], [place] - [address] [place] [address] Phone: [phone] Fax: [phone]   Patient reports affordability concerns with their medications: No  Patient reports access/transportation concerns to their pharmacy: No  Patient reports adherence concerns with their medications:  No     Completed medication reconciliation with patient. Her main concern is she has been unable to get eye drops that were prescribed by Dr. Ellouise Newer office  Objective:  Lab Results  Component Value Date   HGBA1C 5.9 06/18/2023    Lab Results  Component Value Date   CREATININE 0.81 08/17/2023   BUN 13 08/17/2023   NA 135 08/17/2023   K 4.1 08/17/2023   CL 96 (L) 08/17/2023   CO2 25 08/17/2023    Lab Results  Component Value Date   CHOL 204 (H) 06/18/2023   HDL 76.80 06/18/2023   LDLCALC 111 (H) 06/18/2023   TRIG 82.0 06/18/2023   CHOLHDL 3 06/18/2023    Medications Reviewed Today     Reviewed by Bonita Quin, RPH (Pharmacist) on 09/30/23 at 1553  Med List Status: <None>   Medication Order Taking? Sig Documenting Provider Last Dose Status Informant  AIRSUPRA  90-80 MCG/ACT AERO [id number] Yes Inhale 2 puffs into the lungs every 6 (six) hours as needed. [provider] Taking Active   albuterol (VENTOLIN HFA) 108 (90 Base) MCG/ACT inhaler [id number] Yes Inhale 2 puffs into the lungs every 6 (six) hours as needed for wheezing or shortness of breath. Maurilio Lovely D, DO Taking Active Self, Pharmacy Records  ARIPiprazole (ABILIFY) 10 MG tablet [id number] No Take 1 tablet (10 mg total) by mouth daily. Take Abilify 20 mg po daily for 2 weeks till 9/17 , then continue taking 10 mg po daily  Patient not taking: Reported on 09/30/2023   Meredeth Ide, MD Not Taking Active   atorvastatin (LIPITOR) 40 MG tablet [id number] Yes Take 1 tablet (40 mg total) by mouth daily. TAKE 1 TABLET(40 MG) BY MOUTH DAILY Strength: 40 mg Corwin Levins, MD Taking Active   azelastine (OPTIVAR) 0.05 % ophthalmic solution [id number] No Place 1 drop into both eyes 2 (two) times daily.  Patient not taking: Reported on 09/30/2023   Corwin Levins, MD Not Taking Active Self, Pharmacy Records           Med Note Jonnie Kind Aug 22, 2023 10:58 AM) 08/22/23: Reports during [facility] call not currently taking  Budeson-Glycopyrrol-Formoterol (BREZTRI AEROSPHERE) 160-9-4.8 MCG/ACT AERO [id number] Yes Inhale 2 puffs into the lungs in the morning and at bedtime. Maurilio Lovely D, DO Taking Active Self, Pharmacy Records  budesonide (PULMICORT) 0.5 MG/2ML nebulizer solution [id number] Yes Take 0.5 mg by nebulization daily. [provider]  Taking Active Self, Pharmacy Records  carbamazepine (TEGRETOL) 200 MG tablet [id number] Yes Take 2-3 tablets (400-600 mg total) by mouth See admin instructions. Take 400mg  in the morning and 600mg  at bedtime. Archer Asa, MD Taking Active Self, Pharmacy Records  cetirizine (ZYRTEC) 10 MG tablet [id number] Yes Can take one tablet by mouth once daily if needed for runny nose. Alfonse Spruce, MD Taking Active Self, Pharmacy Records  dupilumab (DUPIXENT)  300 MG/2ML prefilled syringe [id number] Yes Inject 300 mg into the skin every 14 (fourteen) days. Hetty Blend, FNP Taking Active   dupilumab (DUPIXENT) prefilled syringe 300 mg [id number]   Alfonse Spruce, MD  Active   Epinastine HCl 0.05 % ophthalmic solution [id number] No Place 1 drop into both eyes 2 (two) times daily.  Patient not taking: Reported on 09/30/2023   Alfonse Spruce, MD Not Taking Active   estradiol (ESTRACE) 0.1 MG/GM vaginal cream [id number] Yes Place 0.5 g vaginally 2 (two) times a week. Place 0.5g nightly for two weeks then twice a week after Loleta Chance, MD Taking Active   famotidine (PEPCID) 40 MG tablet [id number] Yes Take 1 tablet (40 mg total) by mouth 2 (two) times daily. Unk Lightning, [place] Taking Active   ferrous sulfate 325 (65 FE) MG EC tablet [id number] Yes Take 1 tablet (325 mg total) by mouth daily with breakfast. Corwin Levins, MD Taking Active Self, Pharmacy Records  fluticasone [facility]) 50 MCG/ACT nasal spray [id number] Yes Place 1 spray into both nostrils 2 (two) times daily as needed for allergies or rhinitis. Alfonse Spruce, MD Taking Active Self, Pharmacy Records  formoterol (PERFOROMIST) 20 MCG/2ML nebulizer solution [id number] Yes Take 20 mcg by nebulization 2 (two) times daily. [provider] Taking Active Self, Pharmacy Records  furosemide (LASIX) 20 MG tablet [id number] Yes 1 tab by mouth once daily as needed  Patient taking differently: Take 20 mg by mouth daily as needed for edema.   Corwin Levins, MD Taking Active Self, Pharmacy Records           Med Note Jonnie Kind Aug 22, 2023 11:00 AM) 08/22/23: Reports during TOC call uses only PRN- has not needed recently per patient report   hydrOXYzine (VISTARIL) 25 MG capsule [id number] Yes Take 25 mg by mouth 2 (two) times daily as needed. [provider] Taking Active Self, Pharmacy Records           Med Note Lia Hopping, Duffy Rhody Aug 12, 2023  1:43 AM)  Pt states she used both the tablet and capsule  montelukast (SINGULAIR) 10 MG tablet [id number] Yes Take 1 tablet (10 mg total) by mouth at bedtime. Alfonse Spruce, MD Taking Active Self, Pharmacy Records  nystatin (MYCOSTATIN) 100000 UNIT/ML suspension [id number] Yes Take 5 mLs (500,000 Units total) by mouth 4 (four) times daily. Alfonse Spruce, MD Taking Active            Med Note Jonnie Kind Aug 22, 2023 11:04 AM) 08/22/23: Reports during [facility] call she reports will pick up refill at outpatient pharmacy today   olopatadine (PATANOL) 0.1 % ophthalmic solution [id number] No Place 1 drop into both eyes 2 (two) times daily.  Patient not taking: Reported on 09/30/2023   Alfonse Spruce, MD Not Taking Active   ondansetron (ZOFRAN-ODT) 4 MG disintegrating tablet [id number] Yes Take 1 tablet (4 mg total) by mouth every 8 (eight) hours as needed for nausea  or vomiting. Corwin Levins, MD Taking Active   pantoprazole (PROTONIX) 40 MG tablet [id number] Yes TAKE 1 TABLET(40 MG) BY MOUTH TWICE DAILY Glenford Bayley, NP Taking Active Self, Pharmacy Records           Med Note Michele Rockers Sep 30, 2023 10:18 AM)    Skin Protectants, Misc. (EUCERIN) cream [id number] Yes Apply 1 Application topically daily as needed for dry skin. [provider] Taking Active Self, Pharmacy Records  tacrolimus (PROTOPIC) 0.03 % ointment [id number] Yes Apply 1 Application topically 2 (two) times daily as needed (rash).  Patient taking differently: Apply 1 Application topically 2 (two) times daily as needed (recurrent rash).   Alfonse Spruce, MD Taking Active Self, Pharmacy Records  triamcinolone ointment (KENALOG) 0.1 % [id number] Yes Apply 1 Application topically at bedtime. Alfonse Spruce, MD Taking Active Self, Pharmacy Records  Vibegron [facility]) 75 MG TABS [id number] Yes Take 1 tablet (75 mg total) by mouth daily. Loleta Chance, MD Taking Active                Assessment/Plan:   She had a couple bottles that were duplicates: 2 bottles of famotidine with different instructions and cetirizine and levocetirizine bottles  Will call pharmacy to see about her eye drops Rx - was able to get Patanol eye drops filled. Notified patient.  Follow Up Plan: PRN  Arbutus Leas, PharmD, BCPS [facility] [phone]

## 2023-09-30 NOTE — Patient Instructions (Signed)
It was a pleasure speaking with you today!  I was able to speak to your pharmacy and get them to fill your eye drops that were sent in by Dr. Dellis Anes.  Check your bottle for duplicates. From our conversation, you seemed to have 2 bottles of famotidine with different directions and also had both levocetirizine and cetirizine, which you should only take one of.   If you have questions or concerns, feel free to give me a call.  Arbutus Leas, PharmD, BCPS [facility] [phone]

## 2023-10-02 ENCOUNTER — Other Ambulatory Visit: Payer: Self-pay | Admitting: Internal Medicine

## 2023-10-02 DIAGNOSIS — Z1231 Encounter for screening mammogram for malignant neoplasm of breast: Secondary | ICD-10-CM

## 2023-10-03 ENCOUNTER — Ambulatory Visit (INDEPENDENT_AMBULATORY_CARE_PROVIDER_SITE_OTHER): Payer: Medicare HMO | Admitting: *Deleted

## 2023-10-03 DIAGNOSIS — J309 Allergic rhinitis, unspecified: Secondary | ICD-10-CM | POA: Diagnosis not present

## 2023-10-08 DIAGNOSIS — F310 Bipolar disorder, current episode hypomanic: Secondary | ICD-10-CM | POA: Diagnosis not present

## 2023-10-08 DIAGNOSIS — F6381 Intermittent explosive disorder: Secondary | ICD-10-CM | POA: Diagnosis not present

## 2023-10-09 ENCOUNTER — Encounter: Payer: Self-pay | Admitting: Emergency Medicine

## 2023-10-09 ENCOUNTER — Ambulatory Visit (INDEPENDENT_AMBULATORY_CARE_PROVIDER_SITE_OTHER): Payer: Medicare HMO | Admitting: Emergency Medicine

## 2023-10-09 VITALS — BP 160/104 | HR 90 | Temp 98.2°F | Ht 63.0 in | Wt 189.2 lb

## 2023-10-09 DIAGNOSIS — I10 Essential (primary) hypertension: Secondary | ICD-10-CM

## 2023-10-09 DIAGNOSIS — R061 Stridor: Secondary | ICD-10-CM | POA: Insufficient documentation

## 2023-10-09 DIAGNOSIS — R079 Chest pain, unspecified: Secondary | ICD-10-CM

## 2023-10-09 DIAGNOSIS — G4733 Obstructive sleep apnea (adult) (pediatric): Secondary | ICD-10-CM

## 2023-10-09 DIAGNOSIS — J4540 Moderate persistent asthma, uncomplicated: Secondary | ICD-10-CM

## 2023-10-09 DIAGNOSIS — R0602 Shortness of breath: Secondary | ICD-10-CM | POA: Diagnosis not present

## 2023-10-09 MED ORDER — IRBESARTAN 75 MG PO TABS: 75.0000 mg | ORAL_TABLET | Freq: Every day | ORAL | 0 refills | Status: DC

## 2023-10-09 NOTE — Assessment & Plan Note (Signed)
She has elevated DBP today, seems to have mistakenly stopped her Avapro based on review of Dr. Raphael Gibney notes.  I will get her back on this.  She is going to follow-up with him tomorrow to clarify

## 2023-10-09 NOTE — Patient Instructions (Signed)
Based on Dr. Raphael [name] notes he intended for you to stay on Avapro 75 mg once daily.  I will write you prescription for this now.  Please clarify with him when you see him tomorrow that he does not want to adjust your blood pressure medications further. Please continue your [name] and your other inhaled medications as you have been taking them. Continue Dupixent Continue Xyzal, Flonase, Singulair Continue Pepcid and Protonix and follow with gastroenterology as planned We need to try to get you back on your BiPAP every night Follow with Dr Delton Coombes in 6 months or sooner if you have any problems

## 2023-10-09 NOTE — Assessment & Plan Note (Signed)
She has stridor today, all upper airway obstruction without any clear evidence to support an asthma flare at this time.  Plan to continue her same BD regimen, same GERD and rhinitis medications.

## 2023-10-09 NOTE — Assessment & Plan Note (Signed)
Due to upper airway resistance syndrome, vocal cord dysfunction.  This is the main contributor to her recurrent flares.  She also has underlying asthma which compounds the issue.  Working hard to decrease component of GERD and rhinitis.  She has seen ENT.

## 2023-10-09 NOTE — Assessment & Plan Note (Signed)
With chronic hypercapnia.  She needs to be on BiPAP nightly (this would help her upper airway obstruction as well) but she has not been able to tolerate.  Encouraged her to try to get back on BiPAP if able

## 2023-10-09 NOTE — Progress Notes (Signed)
Subjective:    Patient ID: [name], female    DOB: May 01, 1961, 62 y.o.   MRN: [id number]  COPD She complains of cough, shortness of breath and wheezing. Associated symptoms include chest pain. Pertinent negatives include no fever, postnasal drip or rhinorrhea. Her past medical history is significant for COPD.  Shortness of Breath Associated symptoms include chest pain and wheezing. Pertinent negatives include no fever, leg swelling or rhinorrhea. Her past medical history is significant for COPD.    Hospital follow-up visit 03/05/2023 --62 year old woman with a history of asthmatic COPD and frequent exacerbations.  She has upper airway instability and VCD, GERD, chronic rhinitis.  Also with chronic hypercapnic respiratory failure for which she has been treated with BiPAP nightly.  She was just in the hospital for an acute exacerbation, discharged on/13/24.  She was treated with Mikael Spray and budesonide, Brovana while in the hospital.  Also daily pantoprazole.  Remains on Dupixent every 14 days as well as Xyzal, Flonase, Singulair, Pepcid and Protonix.  Her maintenance bronchodilator regimen includes Breztri, uses DuoNeb approximately every other day. Completed prednisone taper.  SLP evalaution today > had some laryngospasm on their eval, they did work on some relaxation techniques   ROV 10/09/2023 --[name] is 52 with a history of severe obstructive lung disease with asthmatic features and frequent exacerbations.  Also with evidence for VCD and upper airway instability, contributors of GERD, chronic rhinitis.  She has chronic hypercapnic respiratory failure and has BiPAP but she is unable to use because it feels like she is smothering. She is hypertensive today. She tells me that her BP meds were adjusted by Dr Jonny Ruiz after her hospitalization in August - but it looks like he intended for her to stay on Avapro 75mg  based on his last office note. She has felt "drunk" with some B ear  pain for last 2 days. Some vertigo sx. Also some tremor.  She is supposed to see Dr. Jonny Ruiz tomorrow, so we should be able to clarify   Review of Systems  Constitutional:  Positive for fatigue. Negative for chills and fever.  HENT:  Negative for postnasal drip, rhinorrhea and sinus pressure.   Respiratory:  Positive for cough, shortness of breath and wheezing.   Cardiovascular:  Positive for chest pain. Negative for palpitations and leg swelling.       Objective:   Physical Exam Vitals:   10/09/23 1439  BP: (!) 117/115  Pulse: 90  Temp: 98.2 F (36.8 C)  TempSrc: Oral  SpO2: 97%  Weight: 189 lb 3.2 oz (85.8 kg)  Height: 5\' 3"  (1.6 m)   Gen: Pleasant, well-nourished, in no distress, hoarse voice.  Somewhat tearful  ENT: No lesions,  mouth clear,  oropharynx clear, no postnasal drip  Neck: No JVD, significant stridor, high-pitched on expiration  Lungs: No use of accessory muscles, referred upper airway noise  Cardiovascular: RRR, heart sounds normal, no murmur or gallops, no peripheral edema  Musculoskeletal: No deformities, no cyanosis or clubbing  Neuro: alert, non focal  Skin: Warm, no lesions or rashes      Assessment & Plan:   Stridor Due to upper airway resistance syndrome, vocal cord dysfunction.  This is the main contributor to her recurrent flares.  She also has underlying asthma which compounds the issue.  Working hard to decrease component of GERD and rhinitis.  She has seen ENT.  Moderate persistent asthma without complication She has stridor today, all upper airway obstruction without any clear evidence to  support an asthma flare at this time.  Plan to continue her same BD regimen, same GERD and rhinitis medications.  HTN (hypertension) She has elevated DBP today, seems to have mistakenly stopped her Avapro based on review of Dr. Raphael Gibney notes.  I will get her back on this.  She is going to follow-up with him tomorrow to clarify  OSA (obstructive sleep  apnea) With chronic hypercapnia.  She needs to be on BiPAP nightly (this would help her upper airway obstruction as well) but she has not been able to tolerate.  Encouraged her to try to get back on BiPAP if able  Time spent 40 minutes  Felicia Pupa, MD, PhD 10/09/2023, 3:05 PM [facility] [phone] or if no answer [phone]

## 2023-10-10 ENCOUNTER — Ambulatory Visit: Payer: Medicare HMO | Admitting: *Deleted

## 2023-10-10 ENCOUNTER — Encounter: Payer: Self-pay | Admitting: Internal Medicine

## 2023-10-10 ENCOUNTER — Ambulatory Visit (INDEPENDENT_AMBULATORY_CARE_PROVIDER_SITE_OTHER): Payer: Medicare HMO | Admitting: Internal Medicine

## 2023-10-10 VITALS — BP 144/86 | HR 80 | Temp 99.3°F | Ht 63.0 in | Wt 187.0 lb

## 2023-10-10 DIAGNOSIS — H6992 Unspecified Eustachian tube disorder, left ear: Secondary | ICD-10-CM | POA: Diagnosis not present

## 2023-10-10 DIAGNOSIS — I10 Essential (primary) hypertension: Secondary | ICD-10-CM | POA: Diagnosis not present

## 2023-10-10 DIAGNOSIS — K611 Rectal abscess: Secondary | ICD-10-CM

## 2023-10-10 DIAGNOSIS — J441 Chronic obstructive pulmonary disease with (acute) exacerbation: Secondary | ICD-10-CM | POA: Diagnosis not present

## 2023-10-10 DIAGNOSIS — J4550 Severe persistent asthma, uncomplicated: Secondary | ICD-10-CM | POA: Diagnosis not present

## 2023-10-10 DIAGNOSIS — G249 Dystonia, unspecified: Secondary | ICD-10-CM

## 2023-10-10 MED ORDER — CLINDAMYCIN HCL 300 MG PO CAPS: 300.0000 mg | ORAL_CAPSULE | Freq: Three times a day (TID) | ORAL | 0 refills | Status: DC

## 2023-10-10 NOTE — Patient Instructions (Addendum)
Please take all new medication as prescribed -- the antibiotic  You can also take Mucinex (or it's generic off brand) for congestion and ear pain, and tylenol as needed for pain.  Please continue all other medications as before, including the new BP medicine from yesterday  Please have the pharmacy call with any other refills you may need.  Please keep your appointments with your specialists as you may have planned  You will be contacted regarding the referral for: Neurology  Please make an Appointment to return in Jan 6, or sooner if needed

## 2023-10-10 NOTE — Progress Notes (Signed)
Patient ID: [name], female   DOB: Nov 07, 1961, 62 y.o.   MRN: [id number]        Chief Complaint: follow up HTN, hand movements, ear pain, perirectal abscess       HPI:  [name] is a 62 y.o. female here with c/o perirectal pain swelling in a localized right buttock, without fever, drainage.  Pt denies chest pain, increased sob or doe, wheezing, orthopnea, PND, increased LE swelling, palpitations, dizziness or syncope.   Pt denies polydipsia, polyuria, or new focal neuro s/s.   Does also have involuntary right hand movements on occasion new in the past few wks worsening.  Also has popping and crackling to the left ear.         Wt Readings from Last 3 Encounters:  10/10/23 187 lb (84.8 kg)  10/09/23 189 lb 3.2 oz (85.8 kg)  09/26/23 183 lb 6 oz (83.2 kg)   BP Readings from Last 3 Encounters:  10/10/23 (!) 144/86  10/09/23 (!) 160/104  09/26/23 134/80         Past Medical History:  Diagnosis Date   Anemia, iron deficiency 12/22/2014   pt. 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      Bipolar disorder (HCC)    with anxiety, depression   COPD (chronic obstructive pulmonary disease) (HCC)    Eczema 05/18/2022   Fibromyalgia 05/14/2014   GERD (gastroesophageal reflux disease)    History of kidney stones    Hyperlipidemia 04/20/2017   HYPERTENSION 05/12/2009   Qualifier: Diagnosis of  By: Truman Hayward Duncan Dull), [name]     Peripheral vascular disease (HCC)    Pneumonia    Prediabetes 02/23/2014   pt. denies   Seizure (HCC)    Stroke (HCC) 11/2020   Urticaria    Past Surgical History:  Procedure Laterality Date   ABDOMINAL HYSTERECTOMY     ANTERIOR CERVICAL DECOMP/DISCECTOMY FUSION N/A 07/28/2020   Procedure: ANTERIOR CERVICAL DECOMPRESSION/DISCECTOMY FUSION.  INTERBODY PROTHESIS, PLATE/SCREWS CERVICAL THREE- CERVICAL FOUR, CERVICAL FOUR- CERVICAL FIVE;  Surgeon: Tressie Stalker, MD;  Location: [facility] OR;  Service: Neurosurgery;  Laterality: N/A;   BACK SURGERY     COLONOSCOPY  12/20/2011   Procedure: COLONOSCOPY;  Surgeon: Freddy Jaksch, MD;  Location: [facility];  Service: Endoscopy;  Laterality: N/A;   COLONOSCOPY  03/05/2012   Procedure: COLONOSCOPY;  Surgeon: Freddy Jaksch, MD;  Location: [facility];  Service: Endoscopy;  Laterality: N/A;   DIAGNOSTIC LAPAROSCOPY     HEMORRHOID SURGERY     INCISE AND DRAIN ABCESS     KIDNEY STONE SURGERY     NECK SURGERY     x 2 Dr Terrilee Files   SPINE SURGERY  2019   TOE SURGERY     TUBAL LIGATION      reports that she quit smoking about 4 years ago. Her smoking use included cigarettes. She started smoking about 40 years ago. She has a 9 pack-year smoking history. She has never used smokeless tobacco. She reports that she does not currently use alcohol. She reports that she does not currently use drugs after having used the following drugs: Marijuana. family history includes Alcohol abuse in her brother and father; Anemia in her daughter, sister, and sister; Anesthesia problems in her father; Asthma in her brother, mother, and sister;  Brain cancer in her sister; Breast cancer (age of onset: 60) in her sister; COPD in her mother and sister; Colon cancer (age of onset: 17) in her father; Diabetes in her brother, father, mother, sister, sister, sister, and sister; Heart disease in her brother, brother, mother, and sister; Hypertension in her brother, daughter, sister, sister, sister, sister, and sister; Kidney disease in her father; Sleep apnea in her brother. Allergies  Allergen Reactions   Sulfa Antibiotics Nausea And Vomiting and Other (See Comments)   Venofer [Iron Sucrose] Hives   Current Outpatient Medications on File Prior to Visit  Medication Sig Dispense Refill   AIRSUPRA 90-80 MCG/ACT AERO Inhale  2 puffs into the lungs every 6 (six) hours as needed.     albuterol (VENTOLIN HFA) 108 (90 Base) MCG/ACT inhaler Inhale 2 puffs into the lungs every 6 (six) hours as needed for wheezing or shortness of breath. 18 g 1   ARIPiprazole (ABILIFY) 10 MG tablet Take 1 tablet (10 mg total) by mouth daily. Take Abilify 20 mg po daily for 2 weeks till 9/17 , then continue taking 10 mg po daily 30 tablet 1   atorvastatin (LIPITOR) 40 MG tablet Take 1 tablet (40 mg total) by mouth daily. TAKE 1 TABLET(40 MG) BY MOUTH DAILY Strength: 40 mg 90 tablet 3   azelastine (OPTIVAR) 0.05 % ophthalmic solution Place 1 drop into both eyes 2 (two) times daily. 6 mL 12   Budeson-Glycopyrrol-Formoterol (BREZTRI AEROSPHERE) 160-9-4.8 MCG/ACT AERO Inhale 2 puffs into the lungs in the morning and at bedtime. 10.7 g 11   budesonide (PULMICORT) 0.5 MG/2ML nebulizer solution Take 0.5 mg by nebulization daily.     carbamazepine (TEGRETOL) 200 MG tablet Take 2-3 tablets (400-600 mg total) by mouth See admin instructions. Take 400mg  in the morning and 600mg  at bedtime. 150 tablet 2   cetirizine (ZYRTEC) 10 MG tablet Can take one tablet by mouth once daily if needed for runny nose. 30 tablet 5   dupilumab (DUPIXENT) 300 MG/2ML prefilled syringe Inject 300 mg into the skin every 14 (fourteen) days. 12 mL 3   Epinastine HCl 0.05 % ophthalmic solution Place 1 drop into both eyes 2 (two) times daily. 10 mL 12   estradiol (ESTRACE) 0.1 MG/GM vaginal cream Place 0.5 g vaginally 2 (two) times a week. Place 0.5g nightly for two weeks then twice a week after 30 g 11   famotidine (PEPCID) 40 MG tablet Take 1 tablet (40 mg total) by mouth 2 (two) times daily. 60 tablet 5   ferrous sulfate 325 (65 FE) MG EC tablet Take 1 tablet (325 mg total) by mouth daily with breakfast. 90 tablet 1   fluticasone (FLONASE) 50 MCG/ACT nasal spray Place 1 spray into both nostrils 2 (two) times daily as needed for allergies or rhinitis. 16 g 5   formoterol  (PERFOROMIST) 20 MCG/2ML nebulizer solution Take 20 mcg by nebulization 2 (two) times daily.     furosemide (LASIX) 20 MG tablet 1 tab by mouth once daily as needed (Patient taking differently: Take 20 mg by mouth daily as needed for edema.) 30 tablet 5   hydrOXYzine (VISTARIL) 25 MG capsule Take 25 mg by mouth 2 (two) times daily as needed.     irbesartan (AVAPRO) 75 MG tablet Take 1 tablet (75 mg total) by mouth daily. 30 tablet 0   montelukast (SINGULAIR) 10 MG tablet Take 1 tablet (10 mg total) by mouth at bedtime. 90 tablet 1   nystatin (MYCOSTATIN) 100000 UNIT/ML  suspension Take 5 mLs (500,000 Units total) by mouth 4 (four) times daily. 473 mL 1   olopatadine (PATANOL) 0.1 % ophthalmic solution Place 1 drop into both eyes 2 (two) times daily. 5 mL 12   ondansetron (ZOFRAN-ODT) 4 MG disintegrating tablet Take 1 tablet (4 mg total) by mouth every 8 (eight) hours as needed for nausea or vomiting. 30 tablet 1   pantoprazole (PROTONIX) 40 MG tablet TAKE 1 TABLET(40 MG) BY MOUTH TWICE DAILY 60 tablet 3   Skin Protectants, Misc. (EUCERIN) cream Apply 1 Application topically daily as needed for dry skin.     tacrolimus (PROTOPIC) 0.03 % ointment Apply 1 Application topically 2 (two) times daily as needed (rash). (Patient taking differently: Apply 1 Application topically 2 (two) times daily as needed (recurrent rash).) 60 g 5   triamcinolone ointment (KENALOG) 0.1 % Apply 1 Application topically at bedtime. 30 g 5   Vibegron (GEMTESA) 75 MG TABS Take 1 tablet (75 mg total) by mouth daily. 30 tablet 2   Current Facility-Administered Medications on File Prior to Visit  Medication Dose Route Frequency Provider Last Rate Last Admin   dupilumab (DUPIXENT) prefilled syringe 300 mg  300 mg Subcutaneous Q14 Days Alfonse Spruce, MD   300 mg at 10/10/23 1025        ROS:  All others reviewed and negative.  Objective        PE:  BP (!) 144/86 (BP Location: Right Arm, Patient Position: Sitting, Cuff  Size: Normal)   Pulse 80   Temp 99.3 F (37.4 C) (Oral)   Ht 5\' 3"  (1.6 m)   Wt 187 lb (84.8 kg)   SpO2 97%   BMI 33.13 kg/m                 Constitutional: Pt appears in NAD               HENT: Head: NCAT.                Right Ear: External ear normal.                 Left Ear: External ear normal.                Eyes: . Pupils are equal, round, and reactive to light. Conjunctivae and EOM are normal               Nose: without d/c or deformity               Neck: Neck supple. Gross normal ROM               Cardiovascular: Normal rate and regular rhythm.                 Pulmonary/Chest: Effort normal and breath sounds without rales or wheezing.                Abd:  Soft, NT, ND, + BS, no organomegaly               Neurological: Pt is alert. At baseline orientation, motor grossly intact               Skin: Skin is warm. No rashes, no other new lesions, LE edema - none               Psychiatric: Pt behavior is normal without agitation   Micro: none  Cardiac tracings I have personally interpreted today:  none  Pertinent  Radiological findings (summarize): none   Lab Results  Component Value Date   WBC 20.7 (H) 08/17/2023   HGB 11.6 (L) 08/17/2023   HCT 36.8 08/17/2023   PLT 501 (H) 08/17/2023   GLUCOSE 135 (H) 08/17/2023   CHOL 204 (H) 06/18/2023   TRIG 82.0 06/18/2023   HDL 76.80 06/18/2023   LDLCALC 111 (H) 06/18/2023   ALT 139 (H) 08/15/2023   AST 41 08/15/2023   NA 135 08/17/2023   K 4.1 08/17/2023   CL 96 (L) 08/17/2023   CREATININE 0.81 08/17/2023   BUN 13 08/17/2023   CO2 25 08/17/2023   TSH 0.38 06/18/2023   INR 0.9 03/14/2023   HGBA1C 5.9 06/18/2023   MICROALBUR 1.0 06/18/2023   Assessment/Plan:  Richard Keenen is a 62 y.o. Black or African American [2] female with  has a past medical history of Anemia, iron deficiency (12/22/2014), Arthritis, ASTHMA (05/12/2009), Bipolar disorder (HCC), COPD (chronic obstructive pulmonary disease) (HCC),  Eczema (05/18/2022), Fibromyalgia (05/14/2014), GERD (gastroesophageal reflux disease), History of kidney stones, Hyperlipidemia (04/20/2017), HYPERTENSION (05/12/2009), Peripheral vascular disease (HCC), Pneumonia, Prediabetes (02/23/2014), Seizure (HCC), Stroke (HCC) (11/2020), and Urticaria.  Task-specific dystonia of hand Mild worsening recent, for neuro referral  COPD/asthma Stable today, cont inhaler prn  HTN (hypertension) BP Readings from Last 3 Encounters:  10/10/23 (!) 144/86  10/09/23 (!) 160/104  09/26/23 134/80   Uncontrolled, but just started new irbesartan 75 yesterday,, pt to continue medical treatment and f/u next visit   Dysfunction of left eustachian tube Ok for add mucinex bid prn  Peri-rectal abscess Mild to mod, for antibx course cleocin 300 tid,  to f/u any worsening symptoms or concerns, declines surgury referral for now  Followup: Return in about 2 months (around 12/23/2023).  Oliver Barre, MD 10/13/2023 7:07 PM [facility]

## 2023-10-13 ENCOUNTER — Encounter: Payer: Self-pay | Admitting: Internal Medicine

## 2023-10-13 DIAGNOSIS — K611 Rectal abscess: Secondary | ICD-10-CM | POA: Insufficient documentation

## 2023-10-13 NOTE — Assessment & Plan Note (Signed)
BP Readings from Last 3 Encounters:  10/10/23 (!) 144/86  10/09/23 (!) 160/104  09/26/23 134/80   Uncontrolled, but just started new irbesartan 75 yesterday,, pt to continue medical treatment and f/u next visit

## 2023-10-13 NOTE — Assessment & Plan Note (Addendum)
Mild to mod, for antibx course cleocin 300 tid,  to f/u any worsening symptoms or concerns, declines surgury referral for now

## 2023-10-13 NOTE — Assessment & Plan Note (Signed)
Stable today, cont inhaler prn

## 2023-10-13 NOTE — Assessment & Plan Note (Signed)
Ok for add mucinex bid prn

## 2023-10-13 NOTE — Assessment & Plan Note (Signed)
Mild worsening recent, for neuro referral

## 2023-10-16 ENCOUNTER — Ambulatory Visit: Payer: Self-pay

## 2023-10-16 ENCOUNTER — Telehealth: Payer: Self-pay | Admitting: Allergy & Immunology

## 2023-10-16 NOTE — Patient Instructions (Signed)
Visit Information  Thank you for taking time to visit with me today. Please don't hesitate to contact me if I can be of assistance to you.   Following are the goals we discussed today:  Contact your providers(Primary Provider and Eye doctor) regarding prescription medications needs Continue to take medications as prescribed. Continue to attend provider visits as scheduled Continue to eat healthy, lean meats, vegetables, fruits, avoid saturated and transfats   Our next appointment is by telephone on 10/22/23 at 2:00 pm  Please call the care guide team at [phone] if you need to cancel or reschedule your appointment.   If you are experiencing a Mental Health or Behavioral Health Crisis or need someone to talk to, please call the Suicide and Crisis Lifeline: 988 call the [place] National Suicide Prevention Lifeline: 336-092-8141 or TTY: 7093854694 TTY (531)024-4133) to talk to a trained counselor call 1-800-273-TALK (toll free, 24 hour hotline)   Kathyrn Sheriff, RN, MSN, BSN, CCM Care Management Coordinator [phone]

## 2023-10-16 NOTE — Telephone Encounter (Signed)
Patient called and wanted to know if she could get a sample of the medication albuterol albuterol (VENTOLIN HFA) 108 (90 Base) MCG/ACT inhaler

## 2023-10-16 NOTE — Telephone Encounter (Signed)
Left message for pt to call us back we do not have samples of albuterol in clinic

## 2023-10-16 NOTE — Patient Outreach (Signed)
Care Coordination   Follow Up Visit Note   10/16/2023 Name: [name] MRN: [id number] DOB: [birth year]  [name] is a 62 y.o. year old female who sees Felicia Levins, MD for primary care. I spoke with  [name] by phone today.  What matters to the patients health and wellness today?  [name] reports she has followed up with providers as scheduled. She is scheduled for upcoming endoscopy on 10/28/23. She reports she is out of her "bipolar" medication and eye drops. Patient states she has requested a refill through the pharmacy, but has not received. She states she will contact provider to obtain refill today.   Goals Addressed             This Visit's Progress    Support with Health managment education       Interventions Today    Flowsheet Row Most Recent Value  Chronic Disease   Chronic disease during today's visit Chronic Obstructive Pulmonary Disease (COPD), Hypertension (HTN)  General Interventions   General Interventions Discussed/Reviewed General Interventions Reviewed, Doctor Visits  [Evaluation of current treatment plan for health condition and patient's adherence to plan.]  Doctor Visits Discussed/Reviewed Doctor Visits Reviewed, Doctor Visits Discussed, Specialist  PCP/Specialist Visits Compliance with follow-up visit  [reviewed upcoming/scheduled appointments]  Education Interventions   Education Provided Provided Education  Provided Verbal Education On Medication, When to see the doctor, Other  [advised to contact provider for health questions/concerns, advised to take medications as prescribed, attend provider visits as recommended. reiterated patient instructions per PCP office visit on 10//24/24, pulmonology OV 10/09/23 and Urogyn on 09/17/23.]  Pharmacy Interventions   Pharmacy Dicussed/Reviewed Pharmacy Topics Reviewed           SDOH assessments and interventions completed:  No  Care Coordination  Interventions:  Yes, provided   Follow up plan: Follow up call scheduled for 10/22/23    Encounter Outcome:  Patient Visit Completed   Felicia Sheriff, RN, MSN, BSN, CCM Care Management Coordinator [phone]

## 2023-10-16 NOTE — Telephone Encounter (Signed)
Pt informed of this and will wait until she can get her refill at pharmacy

## 2023-10-17 ENCOUNTER — Ambulatory Visit (INDEPENDENT_AMBULATORY_CARE_PROVIDER_SITE_OTHER): Payer: Medicare HMO

## 2023-10-17 DIAGNOSIS — J309 Allergic rhinitis, unspecified: Secondary | ICD-10-CM | POA: Diagnosis not present

## 2023-10-21 ENCOUNTER — Encounter: Payer: Self-pay | Admitting: Gastroenterology

## 2023-10-22 ENCOUNTER — Ambulatory Visit: Payer: Self-pay

## 2023-10-22 DIAGNOSIS — H1010 Acute atopic conjunctivitis, unspecified eye: Secondary | ICD-10-CM

## 2023-10-22 NOTE — Patient Instructions (Signed)
Visit Information  Thank you for taking time to visit with me today. Please don't hesitate to contact me if I can be of assistance to you.   Following are the goals we discussed today:  Continue to take medications as prescribed. Continue to attend provider visits as scheduled Continue to eat healthy, lean meats, vegetables, fruits, avoid saturated and transfats Contact provider with health questions or concerns as needed  Our next appointment is by telephone on 11/21/23 at 1:30 pm  Please call the care guide team at [phone] if you need to cancel or reschedule your appointment.   If you are experiencing a Mental Health or Behavioral Health Crisis or need someone to talk to, please call the Suicide and Crisis Lifeline: 988 call the [place] National Suicide Prevention Lifeline: (812)857-4230 or TTY: 321-144-0627 TTY 951 540 0228) to talk to a trained counselor call 1-800-273-TALK (toll free, 24 hour hotline)   Kathyrn Sheriff, RN, MSN, BSN, CCM Care Management Coordinator [phone]

## 2023-10-22 NOTE — Patient Outreach (Signed)
  Care Coordination   Follow Up Visit Note   10/22/2023 Name: [name] MRN: [id number] DOB: 1961-07-21  [name] is a 62 y.o. year old female who sees Corwin Levins, MD for primary care. I spoke with  [name] by phone today.  What matters to the patients health and wellness today?  [name] reports she has all her medications except the eye drops. She states she is unable to afford the eye drops. Patient is receptive to referral to clinical pharmacist. Per review of chart, patient has not had an office visit with allergy specialist, Dr. Dellis Anes, since August 29, 2023 and is due a follow up. [name] reports she will call to schedule appointment.  Goals Addressed             This Visit's Progress    Support with Health managment education       Interventions Today    Flowsheet Row Most Recent Value  Chronic Disease   Chronic disease during today's visit Chronic Obstructive Pulmonary Disease (COPD), Hypertension (HTN)  General Interventions   General Interventions Discussed/Reviewed General Interventions Reviewed  [Evaluation of current treatment plan for health condition and patient's adherence to plan.]  Education Interventions   Education Provided Provided Education  Provided Verbal Education On Medication, When to see the doctor  [advised to contact provider with health questions or concerns,  take medications as prescribed, attend provider visits as scheduled]  Pharmacy Interventions   Pharmacy Dicussed/Reviewed Pharmacy Topics Reviewed, Affording Medications, Referral to Pharmacist  Referral to Pharmacist Cannot afford medications  [Patient reports unable to afford eye drops]           SDOH assessments and interventions completed:  No  Care Coordination Interventions:  Yes, provided   Follow up plan: Follow up call scheduled for 11/21/23    Encounter Outcome:  Patient Visit Completed   Kathyrn Sheriff, RN, MSN, BSN, CCM Care Management Coordinator [phone]

## 2023-10-24 ENCOUNTER — Encounter: Payer: Self-pay | Admitting: Allergy & Immunology

## 2023-10-24 ENCOUNTER — Ambulatory Visit: Payer: Medicare HMO

## 2023-10-24 ENCOUNTER — Ambulatory Visit: Payer: Medicare HMO | Admitting: Allergy & Immunology

## 2023-10-24 ENCOUNTER — Telehealth: Payer: Self-pay

## 2023-10-24 ENCOUNTER — Other Ambulatory Visit: Payer: Self-pay

## 2023-10-24 VITALS — BP 134/88 | HR 77 | Temp 97.9°F | Resp 20

## 2023-10-24 DIAGNOSIS — B999 Unspecified infectious disease: Secondary | ICD-10-CM | POA: Diagnosis not present

## 2023-10-24 DIAGNOSIS — L508 Other urticaria: Secondary | ICD-10-CM | POA: Diagnosis not present

## 2023-10-24 DIAGNOSIS — J4550 Severe persistent asthma, uncomplicated: Secondary | ICD-10-CM

## 2023-10-24 DIAGNOSIS — K219 Gastro-esophageal reflux disease without esophagitis: Secondary | ICD-10-CM

## 2023-10-24 DIAGNOSIS — J3089 Other allergic rhinitis: Secondary | ICD-10-CM

## 2023-10-24 MED ORDER — FLUTICASONE PROPIONATE 50 MCG/ACT NA SUSP: 1.0000 | Freq: Two times a day (BID) | NASAL | 5 refills | Status: DC | PRN

## 2023-10-24 MED ORDER — ALBUTEROL SULFATE (2.5 MG/3ML) 0.083% IN NEBU: 2.5000 mg | INHALATION_SOLUTION | RESPIRATORY_TRACT | 1 refills | Status: DC | PRN

## 2023-10-24 MED ORDER — FAMOTIDINE 40 MG PO TABS: 40.0000 mg | ORAL_TABLET | Freq: Two times a day (BID) | ORAL | 5 refills | Status: AC

## 2023-10-24 MED ORDER — ALBUTEROL SULFATE HFA 108 (90 BASE) MCG/ACT IN AERS: 2.0000 | INHALATION_SPRAY | Freq: Four times a day (QID) | RESPIRATORY_TRACT | 1 refills | Status: DC | PRN

## 2023-10-24 MED ORDER — DOXEPIN HCL 25 MG PO CAPS: 25.0000 mg | ORAL_CAPSULE | Freq: Every evening | ORAL | 5 refills | Status: DC | PRN

## 2023-10-24 MED ORDER — ATROVENT HFA 17 MCG/ACT IN AERS: 2.0000 | INHALATION_SPRAY | RESPIRATORY_TRACT | 5 refills | Status: DC | PRN

## 2023-10-24 MED ORDER — LEVOCETIRIZINE DIHYDROCHLORIDE 5 MG PO TABS: 5.0000 mg | ORAL_TABLET | Freq: Two times a day (BID) | ORAL | 5 refills | Status: DC

## 2023-10-24 MED ORDER — TRIAMCINOLONE ACETONIDE 0.1 % EX OINT: 1.0000 | TOPICAL_OINTMENT | Freq: Every evening | CUTANEOUS | 5 refills | Status: AC

## 2023-10-24 NOTE — Telephone Encounter (Signed)
   [name] 01-07-1961 [id number]  Dear Dr.Byrum:  We have scheduled the above named patient for a(n) Endoscopy  procedure. Our records show that (s)he is under your care for shortness of breath and asthma Please advise as to whether the patient may have Endoscopy procedure on 10/30/23. Please route your response to [facility]  or fax response to [phone].  Sincerely,    [facility]

## 2023-10-24 NOTE — Progress Notes (Signed)
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 Asthma-COPD overlap syndrome - Lung testing is getting closer to your baseline. - Echocardiogram was NORMAL in 2024.  - We may consider changing to Harrington Challenger (handout provided).  - You previously were on TEZSPIRE, but we stopped it because I think the Dupixent worked better for you.  - You definitely had some upper airway constriction sounds today (as compared to lower airway ie asthma), so maybe the endoscopy will be enlightening.  - Daily controller medication(s): Dupixent every two weeks and Breztri two puffs twice daily - Prior to physical activity: Ventolin (GRAY INHALER) 2 puffs 10-15 minutes before physical activity. - Rescue medications: albuterol nebulizer one vial every 4-6 hours as needed OR Ventolin (GRAY INHALER) 2 puffs AND 2 puffs Atrovent (GREEN INHALER) every 4-6 hours as needed - Asthma control goals:  * Full participation in all desired activities (may need albuterol before activity) *  Albuterol use two time or less a week on average (not counting use with activity) * Cough interfering with sleep two time or less a month * Oral steroids no more than once a year * No hospitalizations  2. Chronic urticaria - under good control - Continue with levocetirizine twice daily EVERY DAY.  - Continue with famotidine twice daily EVERY DAY.  - Continue with Doxepin 25mg  at night to help with itching AS NEEDED.  - Continue with triamcinolone twice daily as needed for your lesion.   3. 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 - Continue with allergy shots at the same schedule (come back next week so you start your allergy shots again).   4. Return in about 2 months (around 12/24/2023). You can have the follow up appointment with Dr. Dellis Anes or a Nurse Practicioner (our Nurse Practitioners are excellent and always have Physician oversight!).    Subjective:   Shabana Moschella is a 62 y.o. 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   Acute exacerbation of chronic obstructive pulmonary disease (COPD) (HCC) 08/12/2023   Hypokalemia 08/12/2023   Hyperglycemia 08/12/2023  History of seizures 08/12/2023   Obesity (BMI 30-39.9) 08/12/2023   OSA (obstructive sleep apnea) 08/12/2023   Transaminitis 08/12/2023   COVID-19 virus infection 08/07/2023   Acute respiratory failure with hypoxia and hypercapnia (HCC) 06/23/2023   Peripheral edema 06/18/2023   Cirrhosis (HCC) 03/07/2023   Vocal cord  dysfunction 03/05/2023   Urinary incontinence 01/25/2023   Influenza A 01/11/2023   Bilateral impacted cerumen 12/04/2022   Not well controlled severe persistent asthma 10/25/2022   Acute on chronic respiratory failure with hypoxemia (HCC) 10/07/2022   Abnormal TSH 09/13/2022   Chronic sinusitis 08/14/2022   PND (paroxysmal nocturnal dyspnea) 06/07/2022   Vitamin D deficiency 05/20/2022   Eczema 05/18/2022   Insomnia 05/18/2022   Allergic conjunctivitis 05/18/2022   Chronic respiratory failure with hypoxia and hypercapnia (HCC) 04/30/2022   Bilateral hearing loss 04/14/2022   Moderate persistent asthma without complication 02/02/2022   Low back pain 10/11/2021   Abnormal CXR 07/17/2021   Aortic atherosclerosis (HCC) 07/05/2021   URI (upper respiratory infection) 07/05/2021   Arthrodesis status 06/20/2021   Drug allergy 05/14/2021   Acute cough 05/14/2021   Bilateral leg weakness 04/17/2021   Status post cervical spinal fusion 12/27/2020   History of stroke 12/11/2020   Medication management 10/21/2020   Body mass index (BMI) 26.0-26.9, adult 08/23/2020   Healthcare maintenance 08/11/2020   Cervical spondylosis with myelopathy and radiculopathy 07/28/2020   Preop exam for internal medicine 06/19/2020   Hypotension due to medication 06/11/2020   Essential hypertension 05/17/2020   Other spondylosis with myelopathy, cervical region 02/16/2020   Cervical spondylosis 02/02/2020   Radiculopathy, cervical region 02/02/2020   Spondylolysis, cervical region 02/02/2020   Neck pain 02/02/2020   Leg cramping 08/12/2019   Laryngopharyngeal reflux (LPR) 05/26/2019   Sinus pressure 05/26/2019   Hypophosphatemia 01/07/2019   Vertigo 09/23/2018   Dysfunction of left eustachian tube 09/23/2018   Gait disorder 04/21/2018   Leukocytosis 04/15/2018   Nausea & vomiting 04/15/2018   Seizure disorder (HCC)    Prolapsed cervical intervertebral disc 03/11/2018   Cervical radiculopathy  02/18/2018   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   Syncope 09/04/2017   Constipation 09/04/2017   Nonintractable headache 09/04/2017   Leg swelling 07/26/2017   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   Hyperlipidemia 04/20/2017   Pedal edema 04/19/2017   Bipolar disorder (HCC) 09/19/2016   Facial pain 07/12/2016   Rash 06/14/2016   Migraine 01/25/2016   Idiopathic urticaria/pruritus 01/23/2016   Allergic rhinitis 01/23/2016   Oral candidiasis 01/23/2016   Greater trochanteric bursitis of both hips 09/08/2015   Chest pain 06/22/2015   Itching 06/22/2015   Bilateral knee pain 06/22/2015   Bilateral hip pain 06/22/2015   Chronic iron deficiency anemia 12/22/2014   Fatigue 12/21/2014   Rash and nonspecific skin eruption 08/24/2014   Intertrigo 08/07/2014   Dyspareunia in female 07/12/2014   Menopausal syndrome (hot flushes) 07/12/2014   History of tobacco abuse 07/12/2014   Dizziness 06/22/2014   Weakness 06/22/2014   Fibromyalgia 05/14/2014   Encounter for well adult exam with abnormal findings 04/22/2014   Recurrent boils 04/22/2014   Recurrent falls 04/22/2014   Peripheral vascular disease (HCC)    Depression    Anxiety    GERD (gastroesophageal reflux disease)    Prediabetes 02/23/2014   COPD exacerbation (HCC) 02/23/2014   Encounter for screening mammogram for high-risk patient 02/23/2014   Back pain 07/22/2013   COPD/asthma 08/24/2009   Headache(784.0) 08/24/2009   HTN (hypertension) 05/12/2009   Asthma-COPD overlap  syndrome (HCC) 05/12/2009    History obtained from: chart review and patient.  Discussed the use of AI scribe software for clinical note transcription with the patient and/or guardian, who gave verbal consent to proceed.  Fatmah is a 62 y.o. female presenting for a follow up visit.  She was last seen in September 2024.  At that time, her lung testing looked worse, but she recently been hospitalized.  We restarted her Dupixent every 2 weeks.  We also  continue with Breztri 2 puffs twice daily and albuterol as needed.  For her urticaria, her symptoms were under good control with levocetirizine twice daily and famotidine twice daily.  She also has doxepin 25 mg at night to help with itching as needed.  For her rhinitis, she continued with Flonase as well as allergy shots. We also sent her to Urology.   Since the last visit, she has mostly done well. She is continuing to recover from her hospitalization over the summer.   Asthma/Respiratory Symptom History: Sigrid has been experiencing persistent dyspnea and hoarseness. They report that their breathing has been "terrible" and that they have been experiencing "mini asthma attacks" triggered by exposure to cigarette smoke and exertion, such as walking long distances. These episodes are managed with frequent use of a rescue inhaler, which they report as being life-saving but express concern about overuse.  She is not currently seeing Cardiology. She does see Pulmonology (Dr. Delton Coombes).   Despite these issues, the patient reports an overall improvement in their condition since their last hospitalization. They attribute some of this improvement to the addition of Dupixent to their treatment regimen, which also includes Breztri, taken as two puffs in the morning and two puffs at night.  The patient's asthma and GERD symptoms appear to be affecting their quality of life significantly, limiting their ability to perform routine tasks such as grocery shopping due to fear of running out of breath. They express a desire for further investigation and potential adjustment of their treatment plan.   Echocardiogram (March 2024):     Allergic Rhinitis Symptom History: She remains on the levocetirizine which is mostly for her hives. She also has Flonase to use as needed.   Skin Symptom History: Urticaria are under good control with the use of levocetirizine as well as famotidine and Doxepin. She has triamcinolone to use  as needed.   GERD Symptom History: The patient also describes a sensation of something in their throat, which has led to an upcoming endoscopy scheduled by their gastroenterologist. They are currently on reflux medication.  The patient also reports urinary urgency, which has been managed with medication prescribed by their urologist. This treatment has been successful in reducing the urgency and frequency of urination.  Otherwise, there have been no changes to her past medical history, surgical history, family history, or social history.    Review of systems otherwise negative other than that mentioned in the HPI.    Objective:   Blood pressure 134/88, pulse 77, temperature 97.9 F (36.6 C), temperature source Temporal, resp. rate 20, SpO2 96%. There is no height or weight on file to calculate BMI.    Physical Exam Vitals reviewed.  Constitutional:      Appearance: She is well-developed.     Comments: Very pleasant. Cooperative with the exam.  Breathing comfortably. Talking in full sentences.   HENT:     Head: Normocephalic and atraumatic.     Right Ear: Tympanic membrane, ear canal and external ear normal.  Left Ear: Tympanic membrane, ear canal and external ear normal.     Nose: No nasal deformity, septal deviation, mucosal edema or rhinorrhea.     Right Turbinates: Enlarged, swollen and pale.     Left Turbinates: Enlarged, swollen and pale.     Right Sinus: No maxillary sinus tenderness or frontal sinus tenderness.     Left Sinus: No maxillary sinus tenderness or frontal sinus tenderness.     Comments: No nasal polyps noted.     Mouth/Throat:     Mouth: Mucous membranes are not pale and not dry.     Pharynx: Uvula midline.     Comments: Cobblestoning present in the posterior oropharynx.  Eyes:     General: Lids are normal. Allergic shiner present.        Right eye: No discharge.        Left eye: No discharge.     Conjunctiva/sclera: Conjunctivae normal.     Right  eye: Right conjunctiva is not injected. No chemosis.    Left eye: Left conjunctiva is not injected. No chemosis.    Pupils: Pupils are equal, round, and reactive to light.  Cardiovascular:     Rate and Rhythm: Normal rate and regular rhythm.     Heart sounds: Normal heart sounds.  Pulmonary:     Effort: Pulmonary effort is normal. No tachypnea, accessory muscle usage or respiratory distress.     Breath sounds: Normal breath sounds. Decreased air movement present. No wheezing, rhonchi or rales.     Comments: No wheezing appreciated. Decreased air movement at the bases. Chest:     Chest wall: No tenderness.  Lymphadenopathy:     Cervical: No cervical adenopathy.  Skin:    General: Skin is warm.     Capillary Refill: Capillary refill takes less than 2 seconds.     Coloration: Skin is not pale.     Findings: No abrasion, erythema, petechiae or rash. Rash is not papular, urticarial or vesicular.     Comments: Skin looks clear.   Neurological:     Mental Status: She is alert.  Psychiatric:        Behavior: Behavior is cooperative.      Diagnostic studies:    Spirometry: results abnormal (FEV1: 0.84/41%, FVC: 1.79/69%, FEV1/FVC: 47%).    Spirometry consistent with mixed obstructive and restrictive disease. This is closer to her baseline.   Allergy Studies: none        Malachi Bonds, MD  [facility]

## 2023-10-24 NOTE — Patient Instructions (Addendum)
1. Asthma-COPD overlap syndrome - Lung testing is getting closer to your baseline. - Echocardiogram was NORMAL in 2024.  - We may consider changing to Harrington Challenger (handout provided).  - You previously were on TEZSPIRE, but we stopped it because I think the Dupixent worked better for you.  - You definitely had some upper airway constriction sounds today (as compared to lower airway ie asthma), so maybe the endoscopy will be enlightening.  - Daily controller medication(s): Dupixent every two weeks and Breztri two puffs twice daily - Prior to physical activity: Ventolin (GRAY INHALER) 2 puffs 10-15 minutes before physical activity. - Rescue medications: albuterol nebulizer one vial every 4-6 hours as needed OR Ventolin (GRAY INHALER) 2 puffs AND 2 puffs Atrovent (GREEN INHALER) every 4-6 hours as needed - Asthma control goals:  * Full participation in all desired activities (may need albuterol before activity) * Albuterol use two time or less a week on average (not counting use with activity) * Cough interfering with sleep two time or less a month * Oral steroids no more than once a year * No hospitalizations  2. Chronic urticaria - under good control - Continue with levocetirizine twice daily EVERY DAY.  - Continue with famotidine twice daily EVERY DAY.  - Continue with Doxepin 25mg  at night to help with itching AS NEEDED.  - Continue with triamcinolone twice daily as needed for your lesion.   3. 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 - Continue with allergy shots at the same schedule (come back next week so you start your allergy shots again).   4. Return in about 2 months (around 12/24/2023). You can have the follow up appointment with Dr. Dellis Anes or a Nurse Practicioner (our Nurse Practitioners are excellent and always have Physician oversight!).    Please inform us of any Emergency Department visits,  hospitalizations, or changes in symptoms. Call us before going to the ED for breathing or allergy symptoms since we might be able to fit you in for a sick visit. Feel free to contact us anytime with any questions, problems, or concerns.  It was a pleasure to see you again today! YOU are such a joy!   Websites that have reliable patient information: 1. American Academy of Asthma, Allergy, and Immunology: www.aaaai.org 2. Food Allergy Research and Education (FARE): foodallergy.org 3. Mothers of Asthmatics: http://www.asthmacommunitynetwork.org 4. American College of Allergy, Asthma, and Immunology: www.acaai.org      "Like" [place] on Facebook and Instagram for our latest updates!      A healthy democracy works best when [employer] participate! Make sure you are registered to vote! If you have moved or changed any of your contact information, you will need to get this updated before voting! Scan the QR codes below to learn more!

## 2023-10-24 NOTE — Telephone Encounter (Signed)
She has COPD and has been prescribed BiPAP at night for OSA and chronic hypercapnia.  This puts her at moderate to high risk for anesthesia.  It does not preclude her procedure if the benefits outweigh these risks.  It may be prudent to have CPAP or BiPAP available during the perioperative wake up period to support her as she comes out of sedation.

## 2023-10-25 ENCOUNTER — Telehealth: Payer: Self-pay

## 2023-10-25 ENCOUNTER — Ambulatory Visit
Admission: RE | Admit: 2023-10-25 | Discharge: 2023-10-25 | Disposition: A | Discharge status: Home / Self Care | Payer: Medicare HMO | Source: Ambulatory Visit | Attending: Internal Medicine | Admitting: Internal Medicine

## 2023-10-25 DIAGNOSIS — J449 Chronic obstructive pulmonary disease, unspecified: Secondary | ICD-10-CM | POA: Diagnosis not present

## 2023-10-25 DIAGNOSIS — Z1231 Encounter for screening mammogram for malignant neoplasm of breast: Secondary | ICD-10-CM | POA: Diagnosis not present

## 2023-10-25 DIAGNOSIS — R32 Unspecified urinary incontinence: Secondary | ICD-10-CM | POA: Diagnosis not present

## 2023-10-25 NOTE — Telephone Encounter (Signed)
Contacted patient and let her know that her procedure would have to be moved to the hospital. Informed patient that we would give her a call to schedule at Dr.Danis next hospital endo date. Patient stated understanding and had no questions at the end of the call.

## 2023-10-27 ENCOUNTER — Other Ambulatory Visit: Payer: Self-pay | Admitting: Emergency Medicine

## 2023-10-28 ENCOUNTER — Other Ambulatory Visit (HOSPITAL_COMMUNITY): Payer: Self-pay

## 2023-10-28 ENCOUNTER — Telehealth: Payer: Self-pay | Admitting: *Deleted

## 2023-10-28 NOTE — Telephone Encounter (Signed)
L/m for patient to contact me to advise approval and submit for [facility]

## 2023-10-28 NOTE — Telephone Encounter (Signed)
-----   Message from Alfonse Spruce sent at 10/28/2023  8:49 AM EST ----- Can we change to Harrington Challenger to see how this goes?

## 2023-10-29 ENCOUNTER — Other Ambulatory Visit: Payer: Self-pay | Admitting: *Deleted

## 2023-10-29 ENCOUNTER — Encounter: Payer: Self-pay | Admitting: *Deleted

## 2023-10-29 DIAGNOSIS — K219 Gastro-esophageal reflux disease without esophagitis: Secondary | ICD-10-CM

## 2023-10-29 MED ORDER — FASENRA PEN 30 MG/ML ~~LOC~~ SOAJ: 30.0000 mg | SUBCUTANEOUS | 9 refills | Status: DC

## 2023-10-29 NOTE — Telephone Encounter (Signed)
===  View-only below this line=== ----- Message ----- From: Sherrilyn Rist, MD Sent: 10/24/2023   3:36 PM EST To: Unk Lightning, PA; Lucky Cowboy, RN  Victorino Dike,  Yes, her EGD should be done in the hospital outpatient endoscopy lab.  It can go my next available date, and I have included my nurse regarding scheduling.  There might be an opening as soon as 10/31/2023, but would need nursing to confirm this.  If not, then my next available hospital date.  H Danis

## 2023-10-29 NOTE — Telephone Encounter (Signed)
Patient called and was advised of approval and submit to [facility] pharmacy. Will reach out once delivery set to make appt to start therapy

## 2023-10-29 NOTE — Telephone Encounter (Signed)
Patient called back and would like to come 10/31/23 for procedure.   I have scheduled patient with Dr Myrtie Neither for 10/31/23 at 930 am, [facility]. I have given her verbal prep instructions and have also made those same instructions available via mychart. Patient verbalizes understanding and is asked to contact us with any additional questions or concerns.

## 2023-10-29 NOTE — Telephone Encounter (Signed)
Attempted to reach patient. No answer, voicemail is not set up. Nursing will attempt to reach at a later time. Currently, we may be able to complete hospital endoscopy at [facility] on 10/31/23 (930 am) or [facility] on 11/21/23.

## 2023-10-30 ENCOUNTER — Encounter: Payer: Medicare HMO | Admitting: Gastroenterology

## 2023-10-30 ENCOUNTER — Other Ambulatory Visit: Payer: Self-pay | Admitting: Internal Medicine

## 2023-10-30 DIAGNOSIS — R928 Other abnormal and inconclusive findings on diagnostic imaging of breast: Secondary | ICD-10-CM

## 2023-10-30 NOTE — Telephone Encounter (Signed)
I appreciate you reaching out to her!

## 2023-10-31 ENCOUNTER — Ambulatory Visit (HOSPITAL_COMMUNITY)
Admission: RE | Admit: 2023-10-31 | Discharge: 2023-10-31 | Disposition: A | Discharge status: Home / Self Care | Payer: Medicare HMO | Attending: Gastroenterology | Admitting: Gastroenterology

## 2023-10-31 ENCOUNTER — Ambulatory Visit (HOSPITAL_COMMUNITY): Payer: Medicare HMO | Admitting: Anesthesiology

## 2023-10-31 ENCOUNTER — Encounter (HOSPITAL_COMMUNITY): Payer: Self-pay | Admitting: Gastroenterology

## 2023-10-31 ENCOUNTER — Other Ambulatory Visit: Payer: Self-pay

## 2023-10-31 ENCOUNTER — Encounter (HOSPITAL_COMMUNITY)
Admission: RE | Disposition: A | Discharge status: Home / Self Care | Payer: Self-pay | Source: Home / Self Care | Attending: Gastroenterology

## 2023-10-31 ENCOUNTER — Other Ambulatory Visit: Payer: Medicare HMO

## 2023-10-31 ENCOUNTER — Telehealth: Payer: Self-pay

## 2023-10-31 ENCOUNTER — Other Ambulatory Visit (HOSPITAL_COMMUNITY): Payer: Self-pay

## 2023-10-31 DIAGNOSIS — J449 Chronic obstructive pulmonary disease, unspecified: Secondary | ICD-10-CM | POA: Diagnosis not present

## 2023-10-31 DIAGNOSIS — K219 Gastro-esophageal reflux disease without esophagitis: Secondary | ICD-10-CM

## 2023-10-31 DIAGNOSIS — K2100 Gastro-esophageal reflux disease with esophagitis, without bleeding: Secondary | ICD-10-CM | POA: Diagnosis not present

## 2023-10-31 DIAGNOSIS — I10 Essential (primary) hypertension: Secondary | ICD-10-CM | POA: Diagnosis not present

## 2023-10-31 DIAGNOSIS — Z87891 Personal history of nicotine dependence: Secondary | ICD-10-CM | POA: Diagnosis not present

## 2023-10-31 DIAGNOSIS — Z6833 Body mass index (BMI) 33.0-33.9, adult: Secondary | ICD-10-CM | POA: Insufficient documentation

## 2023-10-31 DIAGNOSIS — E669 Obesity, unspecified: Secondary | ICD-10-CM | POA: Diagnosis not present

## 2023-10-31 DIAGNOSIS — J4489 Other specified chronic obstructive pulmonary disease: Secondary | ICD-10-CM | POA: Insufficient documentation

## 2023-10-31 DIAGNOSIS — F419 Anxiety disorder, unspecified: Secondary | ICD-10-CM | POA: Diagnosis not present

## 2023-10-31 DIAGNOSIS — K31819 Angiodysplasia of stomach and duodenum without bleeding: Secondary | ICD-10-CM | POA: Diagnosis not present

## 2023-10-31 DIAGNOSIS — R519 Headache, unspecified: Secondary | ICD-10-CM | POA: Diagnosis not present

## 2023-10-31 DIAGNOSIS — F319 Bipolar disorder, unspecified: Secondary | ICD-10-CM | POA: Insufficient documentation

## 2023-10-31 DIAGNOSIS — R053 Chronic cough: Secondary | ICD-10-CM | POA: Insufficient documentation

## 2023-10-31 DIAGNOSIS — K3189 Other diseases of stomach and duodenum: Secondary | ICD-10-CM | POA: Diagnosis not present

## 2023-10-31 DIAGNOSIS — J383 Other diseases of vocal cords: Secondary | ICD-10-CM | POA: Diagnosis not present

## 2023-10-31 DIAGNOSIS — I739 Peripheral vascular disease, unspecified: Secondary | ICD-10-CM | POA: Insufficient documentation

## 2023-10-31 DIAGNOSIS — I358 Other nonrheumatic aortic valve disorders: Secondary | ICD-10-CM | POA: Insufficient documentation

## 2023-10-31 HISTORY — PX: ESOPHAGOGASTRODUODENOSCOPY (EGD) WITH PROPOFOL: SHX5813

## 2023-10-31 SURGERY — ESOPHAGOGASTRODUODENOSCOPY (EGD) WITH PROPOFOL
Anesthesia: Monitor Anesthesia Care

## 2023-10-31 MED ORDER — LIDOCAINE 2% (20 MG/ML) 5 ML SYRINGE
INTRAMUSCULAR | Status: DC | PRN
  Administered 2023-10-31: 60 mg via INTRAVENOUS

## 2023-10-31 MED ORDER — PROPOFOL 10 MG/ML IV BOLUS
INTRAVENOUS | Status: DC | PRN
  Administered 2023-10-31: 50 mg via INTRAVENOUS

## 2023-10-31 MED ORDER — PROPOFOL 500 MG/50ML IV EMUL
INTRAVENOUS | Status: DC | PRN
  Administered 2023-10-31: 140 ug/kg/min via INTRAVENOUS

## 2023-10-31 MED ORDER — SODIUM CHLORIDE 0.9 % IV SOLN: INTRAVENOUS | Status: DC

## 2023-10-31 SURGICAL SUPPLY — 15 items

## 2023-10-31 NOTE — H&P (Signed)
History and Physical:  This patient presents for endoscopic testing for:   Chronic cough and GERD.  Clinical details of this patient's reflux and pulmonary issues are outlined in the extensive APP consult note from our office dated 09/26/2023.  I have done additional chart review since then including visits with her pulmonologist, allergist and other specialists.  She has multi factorial chronic cough including asthma, COPD, vocal cord dysfunction, and at this time there is a question of to what extent reflux may be contributing to this as well.  She had undergone an upper endoscopy with me in 2022.  She does describe feelings of heartburn and intermittent regurgitation.  She feels that her breathing is well today.  Patient is otherwise without complaints or active issues today.   Past Medical History: Past Medical History:  Diagnosis Date   Anemia, iron deficiency 12/22/2014   pt. 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      Bipolar disorder (HCC)    with anxiety, depression   COPD (chronic obstructive pulmonary disease) (HCC)    Eczema 05/18/2022   Fibromyalgia 05/14/2014   GERD (gastroesophageal reflux disease)    History of kidney stones    Hyperlipidemia 04/20/2017   HYPERTENSION 05/12/2009   Qualifier: Diagnosis of  By: Truman Hayward Duncan Dull), [name]     Peripheral vascular disease (HCC)    Pneumonia    Prediabetes 02/23/2014   pt. denies   Seizure (HCC)    Stroke (HCC) 11/2020   Urticaria      Past Surgical History: Past Surgical History:  Procedure Laterality Date   ABDOMINAL HYSTERECTOMY     ANTERIOR CERVICAL DECOMP/DISCECTOMY FUSION N/A 07/28/2020   Procedure: ANTERIOR CERVICAL DECOMPRESSION/DISCECTOMY FUSION. INTERBODY PROTHESIS, PLATE/SCREWS CERVICAL THREE- CERVICAL FOUR,  CERVICAL FOUR- CERVICAL FIVE;  Surgeon: Tressie Stalker, MD;  Location: [facility];  Service: Neurosurgery;  Laterality: N/A;   BACK SURGERY     COLONOSCOPY  12/20/2011   Procedure: COLONOSCOPY;  Surgeon: Freddy Jaksch, MD;  Location: [facility] ENDOSCOPY;  Service: Endoscopy;  Laterality: N/A;   COLONOSCOPY  03/05/2012   Procedure: COLONOSCOPY;  Surgeon: Freddy Jaksch, MD;  Location: [facility] ENDOSCOPY;  Service: Endoscopy;  Laterality: N/A;   DIAGNOSTIC LAPAROSCOPY     HEMORRHOID SURGERY     INCISE AND DRAIN ABCESS     KIDNEY STONE SURGERY     NECK SURGERY     x 2 Dr Terrilee Files   SPINE SURGERY  2019   TOE SURGERY     TUBAL LIGATION      Allergies: Allergies  Allergen Reactions   Sulfa Antibiotics Nausea And Vomiting and Other (See Comments)   Venofer [Iron Sucrose] Hives    Outpatient Meds: Current Facility-Administered Medications  Medication Dose Route Frequency Provider Last Rate Last Admin   0.9 %  sodium chloride infusion   Intravenous Continuous Danis, Starr Lake III, MD          ___________________________________________________________________ Objective   Exam:  BP (!) 148/80   Pulse 70   Temp (!) 97 F (36.1 C) (Temporal)   Resp 15   Ht 5\' 3"  (1.6 m)   Wt 86.6 kg   SpO2 97%   BMI 33.83 kg/m   CV: regular , S1/S2 Resp: clear to auscultation bilaterally, normal RR and effort noted GI: soft, no  tenderness, with active bowel sounds.   Assessment: GERD, uncertain if esophagitis at this time, but was not present on last endoscopy. Chronic cough, multifactorial  Plan: EGD  The benefits and risks of the planned procedure were described in detail with the patient or (when appropriate) their health care proxy.  Risks were outlined as including, but not limited to, bleeding, infection, perforation, adverse medication reaction leading to cardiac or pulmonary decompensation, pancreatitis (if ERCP).  The limitation of incomplete mucosal visualization was also discussed.  No  guarantees or warranties were given.  The patient is appropriate for an endoscopic procedure in the ambulatory setting.   - Amada Jupiter, MD

## 2023-10-31 NOTE — Interval H&P Note (Signed)
History and Physical Interval Note:  10/31/2023 9:49 AM  [name]  has presented today for surgery, with the diagnosis of gerd.  The various methods of treatment have been discussed with the patient and family. After consideration of risks, benefits and other options for treatment, the patient has consented to  Procedure(s): ESOPHAGOGASTRODUODENOSCOPY (EGD) WITH PROPOFOL (N/A) as a surgical intervention.  The patient's history has been reviewed, patient examined, no change in status, stable for surgery.  I have reviewed the patient's chart and labs.  Questions were answered to the patient's satisfaction.     Charlie Pitter III

## 2023-10-31 NOTE — Anesthesia Postprocedure Evaluation (Signed)
Anesthesia Post Note  Patient: [name]  Procedure(s) Performed: ESOPHAGOGASTRODUODENOSCOPY (EGD) WITH PROPOFOL     Patient location during evaluation: PACU Anesthesia Type: MAC Level of consciousness: awake and alert, patient cooperative and oriented Pain management: pain level controlled Vital Signs Assessment: post-procedure vital signs reviewed and stable Respiratory status: nonlabored ventilation, spontaneous breathing and respiratory function stable Cardiovascular status: stable and blood pressure returned to baseline Postop Assessment: no apparent nausea or vomiting Anesthetic complications: no   There were no known notable events for this encounter.  Last Vitals:  Vitals:   10/31/23 1023 10/31/23 1027  BP: (!) 148/83 (!) 147/86  Pulse: (!) 58 (!) 55  Resp: 20 15  Temp:    SpO2: 99% 99%    Last Pain:  Vitals:   10/31/23 1027  TempSrc:   PainSc: 0-No pain                 Ariba Lehnen,E. Lainey Nelson

## 2023-10-31 NOTE — Anesthesia Preprocedure Evaluation (Signed)
Anesthesia Evaluation  Patient identified by MRN, date of birth, ID band Patient awake    Reviewed: Allergy & Precautions, NPO status , Patient's Chart, lab work & pertinent test results  History of Anesthesia Complications Negative for: history of anesthetic complications  Airway Mallampati: III  TM Distance: >3 FB Neck ROM: Full    Dental  (+) Dental Advisory Given   Pulmonary COPD,  COPD inhaler, former smoker   breath sounds clear to auscultation       Cardiovascular hypertension, Pt. on medications (-) angina + Peripheral Vascular Disease   Rhythm:Regular Rate:Normal  02/2023 ECHO: EF 60 to 65%.  1. The LV has normal function, no regional wall motion abnormalities. Left ventricular diastolic parameters were normal.   2. RVF is normal. The right ventricular size is normal. There is normal pulmonary artery systolic pressure.   3. The mitral valve is normal in structure. No evidence of MR. No evidence of mitral stenosis.   4. The aortic valve has an indeterminant number of cusps. There is mild  calcification of the aortic valve. There is mild thickening of the aortic valve. Aortic valve regurgitation is not visualized. No aortic stenosis is present.     Neuro/Psych  Headaches  Anxiety Depression Bipolar Disorder   CVA, No Residual Symptoms    GI/Hepatic Neg liver ROS,GERD  Medicated and Controlled,,  Endo/Other    Class 3 obesityBMI 33.8  Renal/GU negative Renal ROS     Musculoskeletal   Abdominal  (+) + obese  Peds  Hematology negative hematology ROS (+)   Anesthesia Other Findings   Reproductive/Obstetrics                              Anesthesia Physical Anesthesia Plan  ASA: 3  Anesthesia Plan: MAC   Post-op Pain Management: Minimal or no pain anticipated   Induction:   PONV Risk Score and Plan: 2 and Treatment may vary due to age or medical condition  Airway Management  Planned: Natural Airway and Nasal Cannula  Additional Equipment: None  Intra-op Plan:   Post-operative Plan:   Informed Consent: I have reviewed the patients History and Physical, chart, labs and discussed the procedure including the risks, benefits and alternatives for the proposed anesthesia with the patient or authorized representative who has indicated his/her understanding and acceptance.     Dental advisory given  Plan Discussed with: CRNA and Surgeon  Anesthesia Plan Comments:          Anesthesia Quick Evaluation

## 2023-10-31 NOTE — Discharge Instructions (Signed)

## 2023-10-31 NOTE — Transfer of Care (Signed)
Immediate Anesthesia Transfer of Care Note  Patient: [name]  Procedure(s) Performed: ESOPHAGOGASTRODUODENOSCOPY (EGD) WITH PROPOFOL  Patient Location: Endoscopy Unit  Anesthesia Type:MAC  Level of Consciousness: awake, alert , and oriented  Airway & Oxygen Therapy: Patient Spontanous Breathing and Patient connected to nasal cannula oxygen  Post-op Assessment: Report given to RN and Post -op Vital signs reviewed and stable  Post vital signs: Reviewed and stable  Last Vitals:  Vitals Value Taken Time  BP    Temp    Pulse    Resp    SpO2      Last Pain:  Vitals:   10/31/23 0759  TempSrc: Temporal  PainSc: 0-No pain         Complications: No notable events documented.

## 2023-10-31 NOTE — Telephone Encounter (Signed)
Pharmacy Patient Advocate Encounter   Received notification from Patient Pharmacy that prior authorization for Levocetirizine Dihydrochloride 5MG  tablets is required/requested.   Insurance verification completed.   The patient is insured through [employer] .   Per test claim: APPROVED from 10-31-2023 to 12-15-2024  KEY  [facility]

## 2023-10-31 NOTE — Op Note (Signed)
[facility] Patient Name: [name] Procedure Date : 10/31/2023 MRN: [id number] Attending MD: Starr Lake. Myrtie Neither , MD, [id number] Date of Birth: [birth year] CSN: [id number] Age: 62 Admit Type: Inpatient Procedure:                Upper GI endoscopy Indications:              Esophageal reflux symptoms that persist despite                            appropriate therapy, Chronic cough                           Clinical details and recent office consult note.                            Chronic multifactorial cough (COPD/asthma, vocal                            cord dysfunction, allergies on Dupixent therapy)                           Clinical question of the extent to which GERD may                            be contributing to this patient's chronic cough                           She reports intermittent pyrosis (while on chronic                            acid suppression) and regurgitation Providers:                Sherilyn Cooter L. Myrtie Neither, MD, Suzy Bouchard, RN, Weston Settle, RN, Rozetta Nunnery, Technician Referring MD:              Medicines:                Monitored Anesthesia Care Complications:            No immediate complications. Estimated Blood Loss:     Estimated blood loss: none. Procedure:                After obtaining informed consent, the endoscope was                            passed under direct vision. Throughout the                            procedure, the patient's blood pressure, pulse, and                            oxygen saturations were monitored continuously. The  [id number]-H190 ([id number]) Olympus endoscope was introduced                            through the mouth, and advanced to the second                            portion of the duodenum. Scope In: Scope Out: Findings:      The esophagus was normal.      There is no endoscopic evidence of Barrett's esophagus, esophagitis,       hiatal hernia,  stricture, varices, areas of dilation, abnormal motility       or lower esophageal sphincter tone abnormalities in the entire esophagus.      A single diminutive angioectasia with no bleeding was found on the       posterior wall of the stomach.      The exam of the stomach was otherwise normal.      The cardia and gastric fundus were normal on retroflexion.      Nodular mucosa was found in the duodenal bulb. Consistent with Brunner's       gland hyperplasia.      The exam of the duodenum was otherwise normal. Impression:               - Normal esophagus.                           - A single non-bleeding angioectasia in the stomach.                           - Nodular mucosa in the duodenal bulb.                           - No specimens collected.                           Even if esophageal pH and manometry testing is                            performed to quantify this patient's reflux, the                            extent to which any demonstrable reflux is                            contributing to this patient's cough cannot be                            known with certainty. This makes for challenging                            decisions regarding the utility and benefit of                            interventions such as TIF or surgical  fundoplication. Recommendation:           - Patient has a contact number available for                            emergencies. The signs and symptoms of potential                            delayed complications were discussed with the                            patient. Return to normal activities tomorrow.                            Written discharge instructions were provided to the                            patient.                           - Resume previous diet.                           - Continue present medications.                           - Return to my office at appointment to be                             scheduled. Discuss further testing at that point. Procedure Code(s):        --- Professional ---                           [phone], Esophagogastroduodenoscopy, flexible,                            transoral; diagnostic, including collection of                            specimen(s) by brushing or washing, when performed                            (separate procedure) Diagnosis Code(s):        --- Professional ---                           K31.89, Other diseases of stomach and duodenum                           K21.9, Gastro-esophageal reflux disease without                            esophagitis                           R05.3, Chronic cough CPT copyright 2022 American Medical Association. All rights reserved. The codes documented in this report are preliminary and upon  coder review may  be revised to meet current compliance requirements. Laila Myhre L. Myrtie Neither, MD 10/31/2023 10:13:34 AM This report has been signed electronically. Number of Addenda: 0

## 2023-11-01 ENCOUNTER — Encounter (HOSPITAL_COMMUNITY): Payer: Self-pay | Admitting: Gastroenterology

## 2023-11-04 ENCOUNTER — Other Ambulatory Visit: Payer: Medicare HMO | Admitting: Pharmacist

## 2023-11-04 DIAGNOSIS — H1010 Acute atopic conjunctivitis, unspecified eye: Secondary | ICD-10-CM

## 2023-11-04 NOTE — Progress Notes (Signed)
   11/04/2023 Name: [name] MRN: [id number] DOB: 1961-10-07  Chief Complaint  Patient presents with   Medication Assistance    [name] is a 62 y.o. year old female who presented for a telephone visit.   They were referred to the pharmacist by their Case Management Team  for assistance in managing  access to eye drops .    Subjective:  Care Team: Primary Care Provider: Corwin Levins, MD ; Next Scheduled Visit: 12/23/2023  Medication Access/Adherence  Current Pharmacy:  [id number] [place], [place] - [address] AT [facility] OF [address] & [address] [place] [address] Phone: [phone] Fax: [phone]  [facility] - [place], [place] - [address] [place] [address] Phone: [phone] Fax: [phone]   Patient reports affordability concerns with their medications: Yes  Patient reports access/transportation concerns to their pharmacy: No  Patient reports adherence concerns with their medications:  No     Her main concern is she has been unable to get eye drops that were prescribed by Dr. Ellouise Newer office. She notes they were $200  Objective:  Lab Results  Component Value Date   HGBA1C 5.9 06/18/2023    Lab Results  Component Value Date   CREATININE 0.81 08/17/2023   BUN 13 08/17/2023   NA 135 08/17/2023   K 4.1 08/17/2023   CL 96 (L) 08/17/2023   CO2 25 08/17/2023    Lab Results  Component Value Date   CHOL 204 (H) 06/18/2023   HDL 76.80 06/18/2023   LDLCALC 111 (H) 06/18/2023   TRIG 82.0 06/18/2023   CHOLHDL 3 06/18/2023    Medications Reviewed Today   Medications were not reviewed in this encounter       Assessment/Plan:  Noted patient has Medicaid and olopatadine is on the preferred drug list, should be covered by Medicaid.  Contacted [facility]. They only have pt's [employer] on file. They ran the olopatidine  0.1% Rx and noted it is not covered by [facility] and that the cash price is $13.99. Spoke to patient and recommended she take her Medicaid card to the pharmacy to make sure they have it on file to run the Rx with. Otherwise, she can do the cash price of $13.99.  Follow Up Plan: PRN  Arbutus Leas, PharmD, BCPS [facility] [phone]

## 2023-11-07 ENCOUNTER — Ambulatory Visit: Payer: Medicare HMO

## 2023-11-07 DIAGNOSIS — J4550 Severe persistent asthma, uncomplicated: Secondary | ICD-10-CM | POA: Diagnosis not present

## 2023-11-09 ENCOUNTER — Ambulatory Visit
Admission: RE | Admit: 2023-11-09 | Discharge: 2023-11-09 | Disposition: A | Discharge status: Home / Self Care | Payer: Medicare HMO | Source: Ambulatory Visit | Attending: Internal Medicine | Admitting: Internal Medicine

## 2023-11-09 DIAGNOSIS — N6002 Solitary cyst of left breast: Secondary | ICD-10-CM | POA: Diagnosis not present

## 2023-11-09 DIAGNOSIS — R928 Other abnormal and inconclusive findings on diagnostic imaging of breast: Secondary | ICD-10-CM

## 2023-11-09 DIAGNOSIS — N6321 Unspecified lump in the left breast, upper outer quadrant: Secondary | ICD-10-CM | POA: Diagnosis not present

## 2023-11-19 ENCOUNTER — Other Ambulatory Visit (HOSPITAL_COMMUNITY): Payer: Self-pay

## 2023-11-19 ENCOUNTER — Ambulatory Visit (INDEPENDENT_AMBULATORY_CARE_PROVIDER_SITE_OTHER): Payer: Medicare HMO

## 2023-11-19 ENCOUNTER — Telehealth: Payer: Self-pay

## 2023-11-19 VITALS — Ht 63.0 in | Wt 191.0 lb

## 2023-11-19 DIAGNOSIS — Z0000 Encounter for general adult medical examination without abnormal findings: Secondary | ICD-10-CM | POA: Diagnosis not present

## 2023-11-19 MED ORDER — CROMOLYN SODIUM 4 % OP SOLN: 1.0000 [drp] | Freq: Four times a day (QID) | OPHTHALMIC | 12 refills | Status: AC

## 2023-11-19 NOTE — Telephone Encounter (Signed)
Pa for epinastine denied Olopatadine/Cromolyn is preferred by the insurance.

## 2023-11-19 NOTE — Addendum Note (Signed)
Addended by: Alfonse Spruce on: 11/19/2023 05:06 PM   Modules accepted: Orders

## 2023-11-19 NOTE — Patient Instructions (Signed)
[name] , Thank you for taking time to come for your Medicare Wellness Visit. I appreciate your ongoing commitment to your health goals. Please review the following plan we discussed and let me know if I can assist you in the future.   Referrals/Orders/Follow-Ups/Clinician Recommendations: It was nice talking to you today.  Wishing you a very [name].  This is a list of the screening recommended for you and due dates:  Health Maintenance  Topic Date Due   COVID-19 Vaccine (4 - 2023-24 season) 12/05/2023*   Hemoglobin A1C  12/19/2023   DTaP/Tdap/Td vaccine (2 - Tdap) 04/22/2024   Yearly kidney health urinalysis for diabetes  06/17/2024   Complete foot exam   06/17/2024   Eye exam for diabetics  07/21/2024   Yearly kidney function blood test for diabetes  08/16/2024   Medicare Annual Wellness Visit  11/18/2024   Mammogram  10/24/2025   Colon Cancer Screening  06/09/2026   Flu Shot  Completed   Hepatitis C Screening  Completed   HIV Screening  Completed   Zoster (Shingles) Vaccine  Completed   HPV Vaccine  Aged Out  *Topic was postponed. The date shown is not the original due date.    Advanced directives: (Declined) Advance directive discussed with you today. Even though you declined this today, please call our office should you change your mind, and we can give you the proper paperwork for you to fill out.  Next Medicare Annual Wellness Visit scheduled for next year: Yes
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
## 2023-11-19 NOTE — Telephone Encounter (Signed)
*  Asthma/Allergy  Pharmacy Patient Advocate Encounter   Received notification from Fax that prior authorization for Epinastine HCl 0.05% opth sol is required/requested.   Insurance verification completed.   The patient is insured through [employer] .   Per test claim:  Olopatadine/Cromolyn is preferred by the insurance.  If suggested medication is appropriate, Please send in a new RX and discontinue this one. If not, please advise as to why it's not appropriate so that we may request a Prior Authorization. Please note, some preferred medications may still require a PA

## 2023-11-20 DIAGNOSIS — F6381 Intermittent explosive disorder: Secondary | ICD-10-CM | POA: Diagnosis not present

## 2023-11-21 ENCOUNTER — Ambulatory Visit: Payer: Medicare HMO | Admitting: *Deleted

## 2023-11-21 ENCOUNTER — Ambulatory Visit: Payer: Self-pay

## 2023-11-21 DIAGNOSIS — J4550 Severe persistent asthma, uncomplicated: Secondary | ICD-10-CM

## 2023-11-21 MED ORDER — BENRALIZUMAB 30 MG/ML ~~LOC~~ SOSY
30.0000 mg | PREFILLED_SYRINGE | Freq: Once | SUBCUTANEOUS | Status: AC
  Administered 2023-11-21: 30 mg via SUBCUTANEOUS

## 2023-11-21 NOTE — Patient Instructions (Signed)
Visit Information  Thank you for taking time to visit with me today. Please don't hesitate to contact me if I can be of assistance to you.   Following are the goals we discussed today:  Continue to take medications as prescribed. Continue to attend provider visits as scheduled Continue to eat healthy, lean meats, vegetables, fruits, avoid saturated and transfats Contact provider with health questions or concerns as needed Continue to check blood sugar as recommended and notify provider if questions or concerns  Our next appointment is by telephone on 12/29/22 at 1:00 pm  Please call the care guide team at [phone] if you need to cancel or reschedule your appointment.   If you are experiencing a Mental Health or Behavioral Health Crisis or need someone to talk to, please call the Suicide and Crisis Lifeline: 988 call the [place] National Suicide Prevention Lifeline: 8160401011 or TTY: 6826302753 TTY [phone]) [phone]) to talk to a trained counselor

## 2023-11-21 NOTE — Progress Notes (Signed)
Immunotherapy   Patient Details  Name: [name] MRN: [id number] Date of Birth: 01-26-1961  11/21/2023  [name] started injections for  Fasenra  Frequency: Every 4 Weeks x3, Then every 8 Weeks  Epi-Pen: Not Required Consent signed and patient instructions given. Patient started Harrington Challenger and received 1mL in the LUA. Patient waited 15 minutes in office and did not experience any issues.   [name] 11/21/2023, 2:10 PM

## 2023-11-21 NOTE — Patient Outreach (Signed)
  Care Coordination   Follow Up Visit Note   11/21/2023 Name: [name] MRN: [id number] DOB: 1961/06/19  [name] is a 62 y.o. year old female who sees Corwin Levins, MD for primary care. I spoke with  [name] by phone today.  What matters to the patients health and wellness today?  [name] reports she has completed an follow up appointment with Dr. Dellis Anes (allergist) on 10/24/23. She reports she has an appointment today at Dr. Ellouise Newer (allergist) office to get her shots. Per patient, she states she has been in contact with clinical pharmacist and now has her eye drops.  She is without questions or concerns at this time.  Goals Addressed             This Visit's Progress    Support with Health managment education       Interventions Today    Flowsheet Row Most Recent Value  Chronic Disease   Chronic disease during today's visit Other, Chronic Obstructive Pulmonary Disease (COPD), Hypertension (HTN)  [asthma-COPD overlap]  General Interventions   General Interventions Discussed/Reviewed General Interventions Reviewed, Doctor Visits  [Evaluation of current treatment plan for health condition and patient's adherence to plan.]  Doctor Visits Discussed/Reviewed PCP, Specialist, Doctor Visits Reviewed  PCP/Specialist Visits Compliance with follow-up visit  [reviewed upcoming provider appointments]  Education Interventions   Education Provided Provided Education  [reviewed patient instructions per office visit with Dr. Gallagher(allergist) on 10/24/23]  Provided Verbal Education On When to see the doctor, Medication, Blood Sugar Monitoring, Nutrition  [advised to continue to eat healthy, attend provider visits as scheduled, take medicatiosn as prescribed, contact provider with health questions or concerns as needed.]  Pharmacy Interventions   Pharmacy Dicussed/Reviewed Pharmacy Topics Discussed           SDOH assessments  and interventions completed:  No  Care Coordination Interventions:  Yes, provided   Follow up plan: Follow up call scheduled for 12/29/22    Encounter Outcome:  Patient Visit Completed   Kathyrn Sheriff, RN, MSN, BSN, CCM Care Management Coordinator [phone]

## 2023-11-28 ENCOUNTER — Ambulatory Visit: Payer: Medicare HMO | Admitting: Nurse Practitioner

## 2023-12-02 ENCOUNTER — Ambulatory Visit (INDEPENDENT_AMBULATORY_CARE_PROVIDER_SITE_OTHER): Payer: Medicare HMO | Admitting: Internal Medicine

## 2023-12-02 ENCOUNTER — Encounter: Payer: Self-pay | Admitting: Internal Medicine

## 2023-12-02 VITALS — BP 122/76 | HR 70 | Temp 98.5°F | Ht 63.0 in | Wt 189.0 lb

## 2023-12-02 DIAGNOSIS — R062 Wheezing: Secondary | ICD-10-CM | POA: Insufficient documentation

## 2023-12-02 DIAGNOSIS — I10 Essential (primary) hypertension: Secondary | ICD-10-CM | POA: Diagnosis not present

## 2023-12-02 DIAGNOSIS — R051 Acute cough: Secondary | ICD-10-CM

## 2023-12-02 DIAGNOSIS — E559 Vitamin D deficiency, unspecified: Secondary | ICD-10-CM | POA: Diagnosis not present

## 2023-12-02 MED ORDER — DOXYCYCLINE HYCLATE 100 MG PO TABS: 100.0000 mg | ORAL_TABLET | Freq: Two times a day (BID) | ORAL | 0 refills | Status: DC

## 2023-12-02 MED ORDER — PREDNISONE 10 MG PO TABS: ORAL_TABLET | ORAL | 0 refills | Status: DC

## 2023-12-02 MED ORDER — HYDROCODONE BIT-HOMATROP MBR 5-1.5 MG/5ML PO SOLN: 5.0000 mL | Freq: Four times a day (QID) | ORAL | 0 refills | Status: AC | PRN

## 2023-12-02 NOTE — Progress Notes (Signed)
Patient ID: [name], female   DOB: Aug 21, 1961, 62 y.o.   MRN: [id number]        Chief Complaint: follow up cough, wheezing, htn, low vit d       HPI:  [name] is a 62 y.o. female Here with acute onset mild to mod 2-3 days ST, HA, general weakness and malaise, with prod cough greenish sputum, but Pt denies chest pain, increased sob or doe, wheezing, orthopnea, PND, increased LE swelling, palpitations, dizziness or syncope, except for mild sob wheezing since yesterday.  Granddaughter ill with cough recently as well.  Pt denies polydipsia, polyuria, or new focal neuro s/s.          Wt Readings from Last 3 Encounters:  12/02/23 189 lb (85.7 kg)  11/19/23 191 lb (86.6 kg)  10/31/23 191 lb (86.6 kg)   BP Readings from Last 3 Encounters:  12/02/23 122/76  10/31/23 (!) 147/86  10/24/23 134/88         Past Medical History:  Diagnosis Date   Anemia, iron deficiency 12/22/2014   pt. 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      Bipolar disorder (HCC)    with anxiety, depression   COPD (chronic obstructive pulmonary disease) (HCC)    Eczema 05/18/2022   Fibromyalgia 05/14/2014   GERD (gastroesophageal reflux disease)    History of kidney stones    Hyperlipidemia 04/20/2017   HYPERTENSION 05/12/2009   Qualifier: Diagnosis of  By: Truman Hayward Duncan Dull), [name]     Peripheral vascular disease (HCC)    Pneumonia    Prediabetes 02/23/2014   pt. denies   Seizure (HCC)    Stroke (HCC) 11/2020   Urticaria    Past Surgical History:  Procedure Laterality Date   ABDOMINAL HYSTERECTOMY     ANTERIOR CERVICAL DECOMP/DISCECTOMY FUSION N/A 07/28/2020   Procedure: ANTERIOR CERVICAL DECOMPRESSION/DISCECTOMY FUSION. INTERBODY PROTHESIS, PLATE/SCREWS CERVICAL THREE- CERVICAL FOUR,  CERVICAL FOUR- CERVICAL FIVE;  Surgeon: Tressie Stalker, MD;  Location: [facility] OR;  Service: Neurosurgery;  Laterality: N/A;   BACK SURGERY     COLONOSCOPY  12/20/2011   Procedure: COLONOSCOPY;  Surgeon: Freddy Jaksch, MD;  Location: [facility] ENDOSCOPY;  Service: Endoscopy;  Laterality: N/A;   COLONOSCOPY  03/05/2012   Procedure: COLONOSCOPY;  Surgeon: Freddy Jaksch, MD;  Location: [facility] ENDOSCOPY;  Service: Endoscopy;  Laterality: N/A;   DIAGNOSTIC LAPAROSCOPY     ESOPHAGOGASTRODUODENOSCOPY (EGD) WITH PROPOFOL N/A 10/31/2023   Procedure: ESOPHAGOGASTRODUODENOSCOPY (EGD) WITH PROPOFOL;  Surgeon: Sherrilyn Rist, MD;  Location: [facility] ENDOSCOPY;  Service: Gastroenterology;  Laterality: N/A;   HEMORRHOID SURGERY     INCISE AND DRAIN ABCESS     KIDNEY STONE SURGERY     NECK SURGERY     x 2 Dr Terrilee Files   SPINE SURGERY  2019   TOE SURGERY     TUBAL LIGATION      reports that she quit smoking about 4 years ago. Her smoking use included cigarettes. She started smoking about 40 years ago. She has a 9 pack-year smoking history. She has never been exposed to tobacco smoke. She has never used smokeless tobacco. She reports that she does not currently use alcohol. She reports that she does not currently use drugs after having used the following drugs: Marijuana. family history  includes Alcohol abuse in her brother and father; Anemia in her daughter, sister, and sister; Anesthesia problems in her father; Asthma in her brother, mother, and sister; Brain cancer in her sister; Breast cancer (age of onset: 27) in her sister; COPD in her mother and sister; Colon cancer (age of onset: 22) in her father; Diabetes in her brother, father, mother, sister, sister, sister, and sister; Heart disease in her brother, brother, mother, and sister; Hypertension in her brother, daughter, sister, sister, sister, sister, and sister; Kidney disease in her father; Sleep apnea in her brother. Allergies  Allergen Reactions   Sulfa  Antibiotics Nausea And Vomiting and Other (See Comments)   Venofer [Iron Sucrose] Hives   Current Outpatient Medications on File Prior to Visit  Medication Sig Dispense Refill   AIRSUPRA 90-80 MCG/ACT AERO Inhale 2 puffs into the lungs every 6 (six) hours as needed.     albuterol (PROVENTIL) (2.5 MG/3ML) 0.083% nebulizer solution Take 3 mLs (2.5 mg total) by nebulization every 4 (four) hours as needed for wheezing or shortness of breath. 75 mL 1   albuterol (VENTOLIN HFA) 108 (90 Base) MCG/ACT inhaler Inhale 2 puffs into the lungs every 6 (six) hours as needed for wheezing or shortness of breath. 18 g 1   ARIPiprazole (ABILIFY) 10 MG tablet Take 1 tablet (10 mg total) by mouth daily. Take Abilify 20 mg po daily for 2 weeks till 9/17 , then continue taking 10 mg po daily 30 tablet 1   atorvastatin (LIPITOR) 40 MG tablet Take 1 tablet (40 mg total) by mouth daily. TAKE 1 TABLET(40 MG) BY MOUTH DAILY Strength: 40 mg 90 tablet 3   azelastine (OPTIVAR) 0.05 % ophthalmic solution Place 1 drop into both eyes 2 (two) times daily. 6 mL 12   benralizumab (FASENRA PEN) 30 MG/ML prefilled autoinjector Inject 1 mL (30 mg total) into the skin every 28 (twenty-eight) days. For 3 doses then every 8 weeks 1 mL 9   Budeson-Glycopyrrol-Formoterol (BREZTRI AEROSPHERE) 160-9-4.8 MCG/ACT AERO Inhale 2 puffs into the lungs in the morning and at bedtime. 10.7 g 11   budesonide (PULMICORT) 0.5 MG/2ML nebulizer solution Take 0.5 mg by nebulization daily.     carbamazepine (TEGRETOL) 200 MG tablet Take 2-3 tablets (400-600 mg total) by mouth See admin instructions. Take 400mg  in the morning and 600mg  at bedtime. 150 tablet 2   cetirizine (ZYRTEC) 10 MG tablet Can take one tablet by mouth once daily if needed for runny nose. 30 tablet 5   clindamycin (CLEOCIN) 300 MG capsule Take 1 capsule (300 mg total) by mouth 3 (three) times daily. 30 capsule 0   cromolyn (OPTICROM) 4 % ophthalmic solution Place 1 drop into both eyes 4  (four) times daily. 10 mL 12   doxepin (SINEQUAN) 25 MG capsule Take 1 capsule (25 mg total) by mouth at bedtime as needed. 30 capsule 5   estradiol (ESTRACE) 0.1 MG/GM vaginal cream Place 0.5 g vaginally 2 (two) times a week. Place 0.5g nightly for two weeks then twice a week after 30 g 11   famotidine (PEPCID) 40 MG tablet Take 1 tablet (40 mg total) by mouth 2 (two) times daily. 60 tablet 5   famotidine (PEPCID) 40 MG tablet Take 1 tablet (40 mg total) by mouth 2 (two) times daily. 60 tablet 5   ferrous sulfate 325 (65 FE) MG EC tablet Take 1 tablet (325 mg total) by mouth daily with breakfast. 90 tablet 1   fluticasone (FLONASE) 50 MCG/ACT  nasal spray Place 1 spray into both nostrils 2 (two) times daily as needed for allergies or rhinitis. 16 g 5   formoterol (PERFOROMIST) 20 MCG/2ML nebulizer solution Take 20 mcg by nebulization 2 (two) times daily.     furosemide (LASIX) 20 MG tablet 1 tab by mouth once daily as needed (Patient taking differently: Take 20 mg by mouth daily as needed for edema.) 30 tablet 5   hydrOXYzine (VISTARIL) 25 MG capsule Take 25 mg by mouth 2 (two) times daily as needed.     ipratropium (ATROVENT HFA) 17 MCG/ACT inhaler Inhale 2 puffs into the lungs every 4 (four) hours as needed for wheezing. 12.9 g 5   irbesartan (AVAPRO) 75 MG tablet TAKE 1 TABLET(75 MG) BY MOUTH DAILY 30 tablet 0   levocetirizine (XYZAL) 5 MG tablet Take 1 tablet (5 mg total) by mouth in the morning and at bedtime. 60 tablet 5   montelukast (SINGULAIR) 10 MG tablet Take 1 tablet (10 mg total) by mouth at bedtime. 90 tablet 1   nystatin (MYCOSTATIN) 100000 UNIT/ML suspension Take 5 mLs (500,000 Units total) by mouth 4 (four) times daily. 473 mL 1   ondansetron (ZOFRAN-ODT) 4 MG disintegrating tablet Take 1 tablet (4 mg total) by mouth every 8 (eight) hours as needed for nausea or vomiting. 30 tablet 1   pantoprazole (PROTONIX) 40 MG tablet TAKE 1 TABLET(40 MG) BY MOUTH TWICE DAILY 60 tablet 3   Skin  Protectants, Misc. (EUCERIN) cream Apply 1 Application topically daily as needed for dry skin.     tacrolimus (PROTOPIC) 0.03 % ointment Apply 1 Application topically 2 (two) times daily as needed (rash). (Patient taking differently: Apply 1 Application topically 2 (two) times daily as needed (recurrent rash).) 60 g 5   triamcinolone ointment (KENALOG) 0.1 % Apply 1 Application topically at bedtime. 30 g 5   Vibegron (GEMTESA) 75 MG TABS Take 1 tablet (75 mg total) by mouth daily. 30 tablet 2   Current Facility-Administered Medications on File Prior to Visit  Medication Dose Route Frequency Provider Last Rate Last Admin   dupilumab (DUPIXENT) prefilled syringe 300 mg  300 mg Subcutaneous Q14 Days Alfonse Spruce, MD   300 mg at 11/07/23 1017        ROS:  All others reviewed and negative.  Objective        PE:  BP 122/76 (BP Location: Right Arm, Patient Position: Sitting, Cuff Size: Normal)   Pulse 70   Temp 98.5 F (36.9 C) (Oral)   Ht 5\' 3"  (1.6 m)   Wt 189 lb (85.7 kg)   SpO2 95%   BMI 33.48 kg/m                 Constitutional: Pt appears mild ill               HENT: Head: NCAT.                Right Ear: External ear normal.                 Left Ear: External ear normal. Bilat tm's with mild erythema.  Max sinus areas non tender.  Pharynx with mild erythema, no exudate               Eyes: . Pupils are equal, round, and reactive to light. Conjunctivae and EOM are normal               Nose: without d/c or deformity  Neck: Neck supple. Gross normal ROM               Cardiovascular: Normal rate and regular rhythm.                 Pulmonary/Chest: Effort normal and breath sounds without rales but few bilat wheezing.                Abd:  Soft, NT, ND, + BS, no organomegaly               Neurological: Pt is alert. At baseline orientation, motor grossly intact               Skin: Skin is warm. No rashes, no other new lesions, LE edema - none               Psychiatric:  Pt behavior is normal without agitation   Micro: none  Cardiac tracings I have personally interpreted today:  none  Pertinent Radiological findings (summarize): none   Lab Results  Component Value Date   WBC 20.7 (H) 08/17/2023   HGB 11.6 (L) 08/17/2023   HCT 36.8 08/17/2023   PLT 501 (H) 08/17/2023   GLUCOSE 135 (H) 08/17/2023   CHOL 204 (H) 06/18/2023   TRIG 82.0 06/18/2023   HDL 76.80 06/18/2023   LDLCALC 111 (H) 06/18/2023   ALT 139 (H) 08/15/2023   AST 41 08/15/2023   NA 135 08/17/2023   K 4.1 08/17/2023   CL 96 (L) 08/17/2023   CREATININE 0.81 08/17/2023   BUN 13 08/17/2023   CO2 25 08/17/2023   TSH 0.38 06/18/2023   INR 0.9 03/14/2023   HGBA1C 5.9 06/18/2023   MICROALBUR 1.0 06/18/2023   Assessment/Plan:  [name] is a 62 y.o. Black or African American [2] female with  has a past medical history of Anemia, iron deficiency (12/22/2014), Arthritis, ASTHMA (05/12/2009), Bipolar disorder (HCC), COPD (chronic obstructive pulmonary disease) (HCC), Eczema (05/18/2022), Fibromyalgia (05/14/2014), GERD (gastroesophageal reflux disease), History of kidney stones, Hyperlipidemia (04/20/2017), HYPERTENSION (05/12/2009), Peripheral vascular disease (HCC), Pneumonia, Prediabetes (02/23/2014), Seizure (HCC), Stroke (HCC) (11/2020), and Urticaria.  Wheezing Mild to mod, for prednisone taper, inhaler prn,  to f/u any worsening symptoms or concerns  Vitamin D deficiency Last vitamin D Lab Results  Component Value Date   VD25OH 28.58 (L) 06/18/2023   Low, to start oral replacement   HTN (hypertension) BP Readings from Last 3 Encounters:  12/02/23 122/76  10/31/23 (!) 147/86  10/24/23 134/88   Stable, pt to continue medical treatment avapro 75 mg qd   Acute cough Mild to mod, for antibx course doxycycline 100 bid, cough med prn,  to f/u any worsening symptoms or concerns  Followup: Return if symptoms worsen or fail to improve.  Oliver Barre, MD  12/02/2023 7:48 PM [facility]

## 2023-12-02 NOTE — Assessment & Plan Note (Signed)
Mild to mod, for antibx course - doxycycline 100 bid, cough med prn, to f/u any worsening symptoms or concerns

## 2023-12-02 NOTE — Assessment & Plan Note (Signed)
BP Readings from Last 3 Encounters:  12/02/23 122/76  10/31/23 (!) 147/86  10/24/23 134/88   Stable, pt to continue medical treatment avapro 75 mg qd

## 2023-12-02 NOTE — Patient Instructions (Signed)
Please take all new medication as prescribed - the antibiotic, cough medicine, prednisone, and inhaler as needed  Please continue all other medications as before, and refills have been done if requested.  Please have the pharmacy call with any other refills you may need.  Please continue your efforts at being more active, low cholesterol diet, and weight control.  Please keep your appointments with your specialists as you may have planned

## 2023-12-02 NOTE — Assessment & Plan Note (Signed)
Last vitamin D Lab Results  Component Value Date   VD25OH 28.58 (L) 06/18/2023   Low, to start oral replacement

## 2023-12-02 NOTE — Assessment & Plan Note (Signed)
Mild to mod, for prednisone taper, inhaler prn, to f/u any worsening symptoms or concerns

## 2023-12-09 ENCOUNTER — Other Ambulatory Visit: Payer: Self-pay

## 2023-12-09 ENCOUNTER — Other Ambulatory Visit: Payer: Self-pay | Admitting: Internal Medicine

## 2023-12-19 ENCOUNTER — Ambulatory Visit: Payer: Medicare HMO

## 2023-12-21 DIAGNOSIS — Z0100 Encounter for examination of eyes and vision without abnormal findings: Secondary | ICD-10-CM | POA: Diagnosis not present

## 2023-12-23 ENCOUNTER — Ambulatory Visit: Payer: Medicare HMO | Admitting: Internal Medicine

## 2023-12-24 ENCOUNTER — Ambulatory Visit: Payer: Medicare HMO | Admitting: *Deleted

## 2023-12-24 ENCOUNTER — Telehealth: Payer: Self-pay | Admitting: *Deleted

## 2023-12-24 DIAGNOSIS — J4550 Severe persistent asthma, uncomplicated: Secondary | ICD-10-CM

## 2023-12-24 NOTE — Telephone Encounter (Signed)
 Patient last received 0.063mL of Red on 11/07/2023, where would you like to back her down to?

## 2023-12-24 NOTE — Telephone Encounter (Signed)
 Let's back down to Green Vial 0.1 mL.   Malachi Bonds, MD [facility]

## 2023-12-25 ENCOUNTER — Ambulatory Visit: Payer: Medicare HMO | Admitting: Internal Medicine

## 2023-12-25 ENCOUNTER — Encounter: Payer: Self-pay | Admitting: Internal Medicine

## 2023-12-25 VITALS — BP 124/76 | HR 84 | Temp 97.8°F | Ht 63.0 in | Wt 192.0 lb

## 2023-12-25 DIAGNOSIS — J4489 Other specified chronic obstructive pulmonary disease: Secondary | ICD-10-CM

## 2023-12-25 DIAGNOSIS — R7303 Prediabetes: Secondary | ICD-10-CM | POA: Diagnosis not present

## 2023-12-25 DIAGNOSIS — E7800 Pure hypercholesterolemia, unspecified: Secondary | ICD-10-CM | POA: Diagnosis not present

## 2023-12-25 DIAGNOSIS — J441 Chronic obstructive pulmonary disease with (acute) exacerbation: Secondary | ICD-10-CM | POA: Diagnosis not present

## 2023-12-25 DIAGNOSIS — Z0001 Encounter for general adult medical examination with abnormal findings: Secondary | ICD-10-CM

## 2023-12-25 DIAGNOSIS — I10 Essential (primary) hypertension: Secondary | ICD-10-CM | POA: Diagnosis not present

## 2023-12-25 DIAGNOSIS — E559 Vitamin D deficiency, unspecified: Secondary | ICD-10-CM

## 2023-12-25 LAB — CBC WITH DIFFERENTIAL/PLATELET
Basophils Absolute: 0 10*3/uL (ref 0.0–0.1)
Basophils Relative: 0.1 % (ref 0.0–3.0)
Eosinophils Absolute: 0 10*3/uL (ref 0.0–0.7)
Eosinophils Relative: 0 % (ref 0.0–5.0)
HCT: 31.6 % — ABNORMAL LOW (ref 36.0–46.0)
Hemoglobin: 10 g/dL — ABNORMAL LOW (ref 12.0–15.0)
Lymphocytes Relative: 23.2 % (ref 12.0–46.0)
Lymphs Abs: 2 10*3/uL (ref 0.7–4.0)
MCHC: 31.6 g/dL (ref 30.0–36.0)
MCV: 85.9 fL (ref 78.0–100.0)
Monocytes Absolute: 0.9 10*3/uL (ref 0.1–1.0)
Monocytes Relative: 10.3 % (ref 3.0–12.0)
Neutro Abs: 5.8 10*3/uL (ref 1.4–7.7)
Neutrophils Relative %: 66.4 % (ref 43.0–77.0)
Platelets: 401 10*3/uL — ABNORMAL HIGH (ref 150.0–400.0)
RBC: 3.68 Mil/uL — ABNORMAL LOW (ref 3.87–5.11)
RDW: 16.6 % — ABNORMAL HIGH (ref 11.5–15.5)
WBC: 8.8 10*3/uL (ref 4.0–10.5)

## 2023-12-25 LAB — URINALYSIS, ROUTINE W REFLEX MICROSCOPIC
Bilirubin Urine: NEGATIVE
Hgb urine dipstick: NEGATIVE
Ketones, ur: NEGATIVE
Leukocytes,Ua: NEGATIVE
Nitrite: NEGATIVE
RBC / HPF: NONE SEEN (ref 0–?)
Specific Gravity, Urine: 1.02 (ref 1.000–1.030)
Total Protein, Urine: NEGATIVE
Urine Glucose: NEGATIVE
Urobilinogen, UA: 0.2 (ref 0.0–1.0)
pH: 6 (ref 5.0–8.0)

## 2023-12-25 LAB — LIPID PANEL
Cholesterol: 171 mg/dL (ref 0–200)
HDL: 77.5 mg/dL (ref >39.00)
LDL Cholesterol: 80 mg/dL (ref 0–99)
NonHDL: 93.26
Total CHOL/HDL Ratio: 2
Triglycerides: 64 mg/dL (ref 0.0–149.0)
VLDL: 12.8 mg/dL (ref 0.0–40.0)

## 2023-12-25 LAB — HEPATIC FUNCTION PANEL
ALT: 16 U/L (ref 0–35)
AST: 22 U/L (ref 0–37)
Albumin: 4.1 g/dL (ref 3.5–5.2)
Alkaline Phosphatase: 89 U/L (ref 39–117)
Bilirubin, Direct: 0.1 mg/dL (ref 0.0–0.3)
Total Bilirubin: 0.3 mg/dL (ref 0.2–1.2)
Total Protein: 6.6 g/dL (ref 6.0–8.3)

## 2023-12-25 LAB — BASIC METABOLIC PANEL
BUN: 11 mg/dL (ref 6–23)
CO2: 32 meq/L (ref 19–32)
Calcium: 9 mg/dL (ref 8.4–10.5)
Chloride: 104 meq/L (ref 96–112)
Creatinine, Ser: 0.69 mg/dL (ref 0.40–1.20)
GFR: 93.94 mL/min (ref >60.00)
Glucose, Bld: 71 mg/dL (ref 70–99)
Potassium: 3.5 meq/L (ref 3.5–5.1)
Sodium: 141 meq/L (ref 135–145)

## 2023-12-25 LAB — TSH: TSH: 0.53 u[IU]/mL (ref 0.35–5.50)

## 2023-12-25 LAB — VITAMIN D 25 HYDROXY (VIT D DEFICIENCY, FRACTURES): VITD: 36.15 ng/mL (ref 30.00–100.00)

## 2023-12-25 LAB — HEMOGLOBIN A1C: Hgb A1c MFr Bld: 6 % (ref 4.6–6.5)

## 2023-12-25 NOTE — Progress Notes (Signed)
 The test results show that your current treatment is OK, as the tests are stable.  Please continue the same plan.  There is no other need for change of treatment or further evaluation based on these results, at this time.  thanks

## 2023-12-25 NOTE — Progress Notes (Signed)
 Patient ID: [name], female   DOB: [birth year], 63 y.o.   MRN: [id number]         Chief Complaint:: wellness exam and Medical Management of Chronic Issues (Follow up , would like a referral to pulmonary at [facility] clinic Fairy danker MD [phone])  ,        HPI:  [name] is a 63 y.o. female here for wellness exam; declines covid booster, o/w up to date                       Also conts to have ongoing suboptimally controlled asthma per pt, asks for second opinion referral [facility].  Pt denies chest pain, orthopnea, PND, increased LE swelling, palpitations, dizziness or syncope.  Pt denies polydipsia, polyuria, or new focal neuro s/s.    Pt denies fever, wt loss, night sweats, loss of appetite, or other constitutional symptoms     Wt Readings from Last 3 Encounters:  12/25/23 192 lb (87.1 kg)  12/02/23 189 lb (85.7 kg)  11/19/23 191 lb (86.6 kg)   BP Readings from Last 3 Encounters:  12/25/23 124/76  12/02/23 122/76  10/31/23 (!) 147/86   Immunization History  Administered Date(s) Administered   Influenza Split 10/23/2011   Influenza Whole 09/16/2012   Influenza, Seasonal, Injecte, Preservative Fre 08/30/2023   Influenza,inj,Quad PF,6+ Mos 10/18/2014, 08/31/2015, 09/25/2016, 10/09/2017, 09/23/2018, 08/12/2019, 10/06/2020, 11/24/2021, 08/10/2022   PFIZER(Purple Top)SARS-COV-2 Vaccination 02/24/2020, 03/16/2020, 10/29/2020   PNEUMOCOCCAL CONJUGATE-20 06/15/2021   Pneumococcal Polysaccharide-23 12/02/2015   Td 04/22/2014   Zoster Recombinant(Shingrix) 11/26/2022, 02/18/2023   Zoster, Unspecified 11/19/2022   Health Maintenance Due  Topic Date Due   COVID-19 Vaccine (4 - 2024-25 season) 08/18/2023      Past Medical History:  Diagnosis Date   Anemia, iron  deficiency 12/22/2014   pt. denies   Arthritis    ASTHMA 05/12/2009   Severe AFL (Spirometry 05/2009: pre-BD FEV1 0.87L 34% pred, post-BD FEV1 1.11L 44% pred) Volumes hyperinflated  Decreased DLCO that does not fully correct to normal range for alveolar volume.      Bipolar disorder (HCC)    with anxiety, depression   COPD (chronic obstructive pulmonary disease) (HCC)    Eczema 05/18/2022   Fibromyalgia 05/14/2014   GERD (gastroesophageal reflux disease)    History of kidney stones    Hyperlipidemia 04/20/2017   HYPERTENSION 05/12/2009   Qualifier: Diagnosis of  By: Primus LATHER LEODIS), [name]     Peripheral vascular disease (HCC)    Pneumonia    Prediabetes 02/23/2014   pt. denies   Seizure (HCC)    Stroke (HCC) 11/2020   Urticaria    Past Surgical History:  Procedure Laterality Date   ABDOMINAL HYSTERECTOMY     ANTERIOR CERVICAL DECOMP/DISCECTOMY FUSION N/A 07/28/2020   Procedure: ANTERIOR CERVICAL DECOMPRESSION/DISCECTOMY FUSION. INTERBODY PROTHESIS, PLATE/SCREWS CERVICAL THREE- CERVICAL FOUR, CERVICAL FOUR- CERVICAL FIVE;  Surgeon: Mavis Purchase, MD;  Location: [facility] OR;  Service: Neurosurgery;  Laterality: N/A;   BACK SURGERY     COLONOSCOPY  12/20/2011   Procedure: COLONOSCOPY;  Surgeon: Elsie CHRISTELLA Cree, MD;  Location: [facility];  Service: Endoscopy;  Laterality: N/A;   COLONOSCOPY  03/05/2012   Procedure: COLONOSCOPY;  Surgeon: Elsie CHRISTELLA Cree, MD;  Location: [facility];  Service: Endoscopy;  Laterality: N/A;   DIAGNOSTIC LAPAROSCOPY     ESOPHAGOGASTRODUODENOSCOPY (EGD) WITH PROPOFOL  N/A 10/31/2023   Procedure: ESOPHAGOGASTRODUODENOSCOPY (EGD) WITH PROPOFOL ;  Surgeon: Legrand Victory LITTIE DOUGLAS, MD;  Location: [facility];  Service: Gastroenterology;  Laterality: N/A;   HEMORRHOID SURGERY     INCISE AND DRAIN ABCESS     KIDNEY STONE SURGERY     NECK SURGERY     x 2 Dr JINNY Budge   SPINE SURGERY  2019   TOE SURGERY     TUBAL LIGATION      reports that she quit smoking about 4 years ago. Her smoking use included cigarettes. She started smoking about 40 years ago. She has a 9 pack-year smoking history. She has never been exposed to tobacco smoke. She has  never used smokeless tobacco. She reports that she does not currently use alcohol . She reports that she does not currently use drugs after having used the following drugs: Marijuana. family history includes Alcohol  abuse in her brother and father; Anemia in her daughter, sister, and sister; Anesthesia problems in her father; Asthma in her brother, mother, and sister; Brain cancer in her sister; Breast cancer (age of onset: 76) in her sister; COPD in her mother and sister; Colon cancer (age of onset: 42) in her father; Diabetes in her brother, father, mother, sister, sister, sister, and sister; Heart disease in her brother, brother, mother, and sister; Hypertension in her brother, daughter, sister, sister, sister, sister, and sister; Kidney disease in her father; Sleep apnea in her brother. Allergies  Allergen Reactions   Sulfa Antibiotics Nausea And Vomiting and Other (See Comments)   Venofer  [Iron  Sucrose] Hives   Current Outpatient Medications on File Prior to Visit  Medication Sig Dispense Refill   AIRSUPRA  90-80 MCG/ACT AERO Inhale 2 puffs into the lungs every 6 (six) hours as needed.     albuterol  (PROVENTIL ) (2.5 MG/3ML) 0.083% nebulizer solution Take 3 mLs (2.5 mg total) by nebulization every 4 (four) hours as needed for wheezing or shortness of breath. 75 mL 1   albuterol  (VENTOLIN  HFA) 108 (90 Base) MCG/ACT inhaler Inhale 2 puffs into the lungs every 6 (six) hours as needed for wheezing or shortness of breath. 18 g 1   ARIPiprazole  (ABILIFY ) 10 MG tablet Take 1 tablet (10 mg total) by mouth daily. Take Abilify  20 mg po daily for 2 weeks till 9/17 , then continue taking 10 mg po daily 30 tablet 1   atorvastatin  (LIPITOR) 40 MG tablet Take 1 tablet (40 mg total) by mouth daily. TAKE 1 TABLET(40 MG) BY MOUTH DAILY Strength: 40 mg 90 tablet 3   azelastine  (OPTIVAR ) 0.05 % ophthalmic solution Place 1 drop into both eyes 2 (two) times daily. 6 mL 12   benralizumab  (FASENRA  PEN) 30 MG/ML prefilled  autoinjector Inject 1 mL (30 mg total) into the skin every 28 (twenty-eight) days. For 3 doses then every 8 weeks 1 mL 9   Budeson-Glycopyrrol-Formoterol  (BREZTRI  AEROSPHERE) 160-9-4.8 MCG/ACT AERO Inhale 2 puffs into the lungs in the morning and at bedtime. 10.7 g 11   budesonide  (PULMICORT ) 0.5 MG/2ML nebulizer solution Take 0.5 mg by nebulization daily.     carbamazepine  (TEGRETOL ) 200 MG tablet Take 2-3 tablets (400-600 mg total) by mouth See admin instructions. Take 400mg  in the morning and 600mg  at bedtime. 150 tablet 2   cetirizine  (ZYRTEC ) 10 MG tablet Can take one tablet by mouth once daily if needed for runny nose. 30 tablet 5   clindamycin  (CLEOCIN ) 300 MG capsule Take 1 capsule (300 mg total) by mouth 3 (three) times daily. 30 capsule 0   cromolyn  (OPTICROM ) 4 % ophthalmic solution Place 1 drop into both eyes 4 (four) times daily. 10  mL 12   doxepin  (SINEQUAN ) 25 MG capsule Take 1 capsule (25 mg total) by mouth at bedtime as needed. 30 capsule 5   doxycycline  (VIBRA -TABS) 100 MG tablet Take 1 tablet (100 mg total) by mouth 2 (two) times daily. 20 tablet 0   estradiol  (ESTRACE ) 0.1 MG/GM vaginal cream Place 0.5 g vaginally 2 (two) times a week. Place 0.5g nightly for two weeks then twice a week after 30 g 11   famotidine  (PEPCID ) 40 MG tablet Take 1 tablet (40 mg total) by mouth 2 (two) times daily. 60 tablet 5   famotidine  (PEPCID ) 40 MG tablet Take 1 tablet (40 mg total) by mouth 2 (two) times daily. 60 tablet 5   ferrous sulfate  325 (65 FE) MG EC tablet TAKE 1 TABLET(325 MG) BY MOUTH DAILY WITH BREAKFAST 90 tablet 1   fluticasone  (FLONASE ) 50 MCG/ACT nasal spray Place 1 spray into both nostrils 2 (two) times daily as needed for allergies or rhinitis. 16 g 5   formoterol  (PERFOROMIST ) 20 MCG/2ML nebulizer solution Take 20 mcg by nebulization 2 (two) times daily.     furosemide  (LASIX ) 20 MG tablet 1 tab by mouth once daily as needed (Patient taking differently: Take 20 mg by mouth daily  as needed for edema.) 30 tablet 5   hydrOXYzine  (VISTARIL ) 25 MG capsule Take 25 mg by mouth 2 (two) times daily as needed.     ipratropium (ATROVENT  HFA) 17 MCG/ACT inhaler Inhale 2 puffs into the lungs every 4 (four) hours as needed for wheezing. 12.9 g 5   irbesartan  (AVAPRO ) 75 MG tablet TAKE 1 TABLET(75 MG) BY MOUTH DAILY 30 tablet 0   levocetirizine (XYZAL ) 5 MG tablet Take 1 tablet (5 mg total) by mouth in the morning and at bedtime. 60 tablet 5   montelukast  (SINGULAIR ) 10 MG tablet Take 1 tablet (10 mg total) by mouth at bedtime. 90 tablet 1   nystatin  (MYCOSTATIN ) 100000 UNIT/ML suspension Take 5 mLs (500,000 Units total) by mouth 4 (four) times daily. 473 mL 1   ondansetron  (ZOFRAN -ODT) 4 MG disintegrating tablet Take 1 tablet (4 mg total) by mouth every 8 (eight) hours as needed for nausea or vomiting. 30 tablet 1   pantoprazole  (PROTONIX ) 40 MG tablet TAKE 1 TABLET(40 MG) BY MOUTH TWICE DAILY 60 tablet 3   predniSONE  (DELTASONE ) 10 MG tablet 3 tabs by mouth per day for 3 days,2tabs per day for 3 days,1tab per day for 3 days 18 tablet 0   Skin Protectants, Misc. (EUCERIN) cream Apply 1 Application topically daily as needed for dry skin.     tacrolimus  (PROTOPIC ) 0.03 % ointment Apply 1 Application topically 2 (two) times daily as needed (rash). (Patient taking differently: Apply 1 Application topically 2 (two) times daily as needed (recurrent rash).) 60 g 5   triamcinolone  ointment (KENALOG ) 0.1 % Apply 1 Application topically at bedtime. 30 g 5   Vibegron  (GEMTESA ) 75 MG TABS Take 1 tablet (75 mg total) by mouth daily. 30 tablet 2   Current Facility-Administered Medications on File Prior to Visit  Medication Dose Route Frequency Provider Last Rate Last Admin   dupilumab  (DUPIXENT ) prefilled syringe 300 mg  300 mg Subcutaneous Q14 Days Gallagher, Joel Louis, MD   300 mg at 12/24/23 0905        ROS:  All others reviewed and negative.  Objective        PE:  BP 124/76 (BP Location:  Right Arm, Patient Position: Sitting, Cuff Size: Normal)  Pulse 84   Temp 97.8 F (36.6 C) (Oral)   Ht 5' 3 (1.6 m)   Wt 192 lb (87.1 kg)   SpO2 97%   BMI 34.01 kg/m                 Constitutional: Pt appears in NAD               HENT: Head: NCAT.                Right Ear: External ear normal.                 Left Ear: External ear normal.                Eyes: . Pupils are equal, round, and reactive to light. Conjunctivae and EOM are normal               Nose: without d/c or deformity               Neck: Neck supple. Gross normal ROM               Cardiovascular: Normal rate and regular rhythm.                 Pulmonary/Chest: Effort normal and breath sounds without rales or wheezing but decreased BS bilateral               Abd:  Soft, NT, ND, + BS, no organomegaly               Neurological: Pt is alert. At baseline orientation, motor grossly intact               Skin: Skin is warm. No rashes, no other new lesions, LE edema - none               Psychiatric: Pt behavior is normal without agitation   Micro: none  Cardiac tracings I have personally interpreted today:  none  Pertinent Radiological findings (summarize): none   Lab Results  Component Value Date   WBC 8.8 12/25/2023   HGB 10.0 (L) 12/25/2023   HCT 31.6 (L) 12/25/2023   PLT 401.0 (H) 12/25/2023   GLUCOSE 71 12/25/2023   CHOL 171 12/25/2023   TRIG 64.0 12/25/2023   HDL 77.50 12/25/2023   LDLCALC 80 12/25/2023   ALT 16 12/25/2023   AST 22 12/25/2023   NA 141 12/25/2023   K 3.5 12/25/2023   CL 104 12/25/2023   CREATININE 0.69 12/25/2023   BUN 11 12/25/2023   CO2 32 12/25/2023   TSH 0.53 12/25/2023   INR 0.9 03/14/2023   HGBA1C 6.0 12/25/2023   MICROALBUR 1.0 06/18/2023   Assessment/Plan:  [name] is a 63 y.o. Black or African American [2] female with  has a past medical history of Anemia, iron  deficiency (12/22/2014), Arthritis, ASTHMA (05/12/2009), Bipolar disorder (HCC), COPD  (chronic obstructive pulmonary disease) (HCC), Eczema (05/18/2022), Fibromyalgia (05/14/2014), GERD (gastroesophageal reflux disease), History of kidney stones, Hyperlipidemia (04/20/2017), HYPERTENSION (05/12/2009), Peripheral vascular disease (HCC), Pneumonia, Prediabetes (02/23/2014), Seizure (HCC), Stroke (HCC) (11/2020), and Urticaria.  Encounter for well adult exam with abnormal findings Age and sex appropriate education and counseling updated with regular exercise and diet Referrals for preventative services - none needed Immunizations addressed - decliens covid booster Smoking counseling  - none needed Evidence for depression or other mood disorder - none significant Most recent labs reviewed. I have personally reviewed and have noted: 1) the patient's  medical and social history 2) The patient's current medications and supplements 3) The patient's height, weight, and BMI have been recorded in the chart   COPD/asthma Stabe today, pt requests second opinion referral due to ongoing difficulty with maintaining  HTN (hypertension) BP Readings from Last 3 Encounters:  12/25/23 124/76  12/02/23 122/76  10/31/23 (!) 147/86   Stable, pt to continue medical treatment avapro  75 qd   Prediabetes Lab Results  Component Value Date   HGBA1C 6.0 12/25/2023   Stable, pt to continue current medical treatment - diet, wt control   Vitamin D  deficiency Last vitamin D  Lab Results  Component Value Date   VD25OH 36.15 12/25/2023   Low, to start  oral replacement  Followup: Return in about 6 months (around 06/23/2024).  Lynwood Rush, MD 12/27/2023 8:33 PM [facility]

## 2023-12-25 NOTE — Patient Instructions (Signed)
 Please continue all other medications as before, and refills have been done if requested.  Please have the pharmacy call with any other refills you may need.  Please continue your efforts at being more active, low cholesterol diet, and weight control.  You are otherwise up to date with prevention measures today.  Please keep your appointments with your specialists as you may have planned  You will be contacted regarding the referral for: pulmonary - [facility] as requested  Please go to the LAB at the blood drawing area for the tests to be done  You will be contacted by phone if any changes need to be made immediately.  Otherwise, you will receive a letter about your results with an explanation, but please check with MyChart first.  Please make an Appointment to return in 6 months, or sooner if needed

## 2023-12-26 ENCOUNTER — Ambulatory Visit (INDEPENDENT_AMBULATORY_CARE_PROVIDER_SITE_OTHER): Payer: Medicare HMO

## 2023-12-26 DIAGNOSIS — J309 Allergic rhinitis, unspecified: Secondary | ICD-10-CM | POA: Diagnosis not present

## 2023-12-26 NOTE — Telephone Encounter (Signed)
 Patient has been informed.

## 2023-12-26 NOTE — Telephone Encounter (Signed)
 Allergy Vial has been mixed down to Green and allergy flow sheet has been updated. Called and left a voicemail asking for patient to return call to discuss.

## 2023-12-27 ENCOUNTER — Encounter: Payer: Self-pay | Admitting: Internal Medicine

## 2023-12-27 DIAGNOSIS — F6381 Intermittent explosive disorder: Secondary | ICD-10-CM | POA: Diagnosis not present

## 2023-12-27 NOTE — Assessment & Plan Note (Signed)
 Last vitamin D Lab Results  Component Value Date   VD25OH 36.15 12/25/2023   Low, to start  oral replacement

## 2023-12-27 NOTE — Assessment & Plan Note (Signed)
 BP Readings from Last 3 Encounters:  12/25/23 124/76  12/02/23 122/76  10/31/23 (!) 147/86   Stable, pt to continue medical treatment avapro 75 qd

## 2023-12-27 NOTE — Assessment & Plan Note (Signed)
 Lab Results  Component Value Date   HGBA1C 6.0 12/25/2023   Stable, pt to continue current medical treatment - diet, wt control

## 2023-12-27 NOTE — Assessment & Plan Note (Signed)
 Stabe today, pt requests second opinion referral due to ongoing difficulty with maintaining

## 2023-12-27 NOTE — Assessment & Plan Note (Signed)
Age and sex appropriate education and counseling updated with regular exercise and diet Referrals for preventative services - none needed Immunizations addressed - decliens covid booster Smoking counseling  - none needed Evidence for depression or other mood disorder - none significant Most recent labs reviewed. I have personally reviewed and have noted: 1) the patient's medical and social history 2) The patient's current medications and supplements 3) The patient's height, weight, and BMI have been recorded in the chart

## 2023-12-30 ENCOUNTER — Other Ambulatory Visit: Payer: Self-pay | Admitting: Internal Medicine

## 2023-12-30 ENCOUNTER — Other Ambulatory Visit: Payer: Self-pay

## 2023-12-30 ENCOUNTER — Other Ambulatory Visit: Payer: Self-pay | Admitting: Obstetrics

## 2023-12-30 ENCOUNTER — Ambulatory Visit: Payer: Self-pay

## 2023-12-30 ENCOUNTER — Other Ambulatory Visit: Payer: Self-pay | Admitting: Allergy & Immunology

## 2023-12-30 NOTE — Patient Outreach (Signed)
  Care Coordination   Follow Up Visit Note   12/30/2023 Name: [name] MRN: [id number] DOB: 1961-10-13  [name] is a 63 y.o. year old female who sees Norleen Lynwood ORN, MD for primary care. I spoke with  [name] by phone today.  What matters to the patients health and wellness today?  Ms. Bouie continues to see providers as scheduled. She reports she is awaiting appointment for second opinion from [place]. She denies any questions at this time. She reports prn Airsupra  inhaler is helping. She continues to be seen by Allergy  Asthma specialist, Dr. Lenox 01/01/24; Comer Rouleau, pulmonology on 01/22/24; neurology 02/03/24, Dr. Margaret and gastroenterologist Dr. Legrand on 02/04/24. Ms. Roorda denies any specific questions or concerns at this time.  Goals Addressed             This Visit's Progress    Support with Health managment education       Interventions Today    Flowsheet Row Most Recent Value  Chronic Disease   Chronic disease during today's visit Other, Chronic Obstructive Pulmonary Disease (COPD), Hypertension (HTN)  [asthma-COPD overlap]  General Interventions   General Interventions Discussed/Reviewed General Interventions Reviewed, Doctor Visits  [Evaluation of current treatment plan for health condition and patient's adherence to plan.]  Doctor Visits Discussed/Reviewed PCP, Specialist, Doctor Visits Reviewed  PCP/Specialist Visits Compliance with follow-up visit  [reviewed upcoming provider appointments]  Education Interventions   Education Provided Provided Education  [reviewed patient instructions per office visit with Dr. Gallagher(allergist) on 10/24/23]  Provided Verbal Education On When to see the doctor, Medication, Blood Sugar Monitoring, Nutrition  [advised to continue to eat healthy, attend provider visits as scheduled, take medicatiosn as prescribed, contact provider with health questions or  concerns as needed.]  Pharmacy Interventions   Pharmacy Dicussed/Reviewed Pharmacy Topics Discussed           SDOH assessments and interventions completed:  No  Care Coordination Interventions:  Yes, provided   Follow up plan: Follow up call scheduled for 02/13/24    Encounter Outcome:  Patient Visit Completed   Heddy Shutter, RN, MSN, BSN, CCM Care Management Coordinator [phone]

## 2023-12-30 NOTE — Patient Instructions (Signed)
 Visit Information  Thank you for taking time to visit with me today. Please don't hesitate to contact me if I can be of assistance to you.   Following are the goals we discussed today:  Continue to take medications as prescribed. Continue to attend provider visits as scheduled Continue to eat healthy, lean meats, vegetables, fruits, avoid saturated and transfats Contact provider with health questions or concerns as needed Follow up with primary care provider regarding referral if you do not hear anything within the next 1-2 weeks.  Our next appointment is by telephone on 02/13/24 at 11:30 am  Please call the care guide team at [phone] if you need to cancel or reschedule your appointment.   If you are experiencing a Mental Health or Behavioral Health Crisis or need someone to talk to, please call the Suicide and Crisis Lifeline: 988 call the [place]  National Suicide Prevention Lifeline: 304-599-8771 or TTY: (405) 717-8088 TTY (253)666-1499) to talk to a trained counselor  Heddy Shutter, RN, MSN, BSN, CCM Care Management Coordinator [phone]

## 2023-12-31 ENCOUNTER — Other Ambulatory Visit: Payer: Self-pay

## 2024-01-01 ENCOUNTER — Encounter: Payer: Self-pay | Admitting: Dermatology

## 2024-01-01 ENCOUNTER — Ambulatory Visit: Payer: Medicare HMO | Admitting: Dermatology

## 2024-01-01 VITALS — BP 142/93

## 2024-01-01 DIAGNOSIS — B079 Viral wart, unspecified: Secondary | ICD-10-CM | POA: Diagnosis not present

## 2024-01-01 DIAGNOSIS — J449 Chronic obstructive pulmonary disease, unspecified: Secondary | ICD-10-CM | POA: Diagnosis not present

## 2024-01-01 DIAGNOSIS — L501 Idiopathic urticaria: Secondary | ICD-10-CM | POA: Diagnosis not present

## 2024-01-01 DIAGNOSIS — R32 Unspecified urinary incontinence: Secondary | ICD-10-CM | POA: Diagnosis not present

## 2024-01-01 NOTE — Patient Instructions (Addendum)
 Hi [name],  Thank you for visiting my office today. Your dedication to managing your health and addressing your dermatological concerns is greatly appreciated. Here is a summary of the key instructions from today's consultation:  Triamcinolone  Cream: Continue using triamcinolone  cream for urticaria flare-ups. However, limit its use on the face to no more than 5-7 days to avoid potential side effects such as acne and skin thinning.  Ketoconazole Cream: Start applying ketoconazole cream under the breast twice daily until the rash resolves.  Moisturizer: Apply moisturizer twice daily, focusing especially on areas treated with liquid nitrogen for filiform warts.  Urticaria Triggers: Avoid known triggers for your urticaria, including seafood and certain environmental factors.  Medication Refills: No medication refills are needed at this time.  Wart Treatment: Your filiform warts on your right cheek have been treated with liquid nitrogen today. Please expect the treated areas to crust and fall off.  Additional Care: I have provided you with moisturizers to help keep your skin hydrated. Remember to stay warm and take care of your skin.  Please do not hesitate to reach out if you have any questions or concerns before your next visit.  Warm regards,  Dr. Louana Roup [facility]      Cryotherapy Aftercare  Wash gently with soap and water everyday.   Apply Vaseline and Band-Aid daily until healed.   Important Information  Due to recent changes in healthcare laws, you may see results of your pathology and/or laboratory studies on MyChart before the doctors have had a chance to review them. We understand that in some cases there may be results that are confusing or concerning to you. Please understand that not all results are received at the same time and often the doctors may need to interpret multiple results in order to provide you with the best plan of care or course of treatment.  Therefore, we ask that you please give us  2 business days to thoroughly review all your results before contacting the office for clarification. Should we see a critical lab result, you will be contacted sooner.   If You Need Anything After Your Visit  If you have any questions or concerns for your doctor, please call our main line at [phone] If no one answers, please leave a voicemail as directed and we will return your call as soon as possible. Messages left after 4 pm will be answered the following business day.   You may also send us  a message via MyChart. We typically respond to MyChart messages within 1-2 business days.  For prescription refills, please ask your pharmacy to contact our office. Our fax number is [phone].  If you have an urgent issue when the clinic is closed that cannot wait until the next business day, you can page your doctor at the number below.    Please note that while we do our best to be available for urgent issues outside of office hours, we are not available 24/7.   If you have an urgent issue and are unable to reach us , you may choose to seek medical care at your doctor's office, retail clinic, urgent care center, or emergency room.  If you have a medical emergency, please immediately call 911 or go to the emergency department. In the event of inclement weather, please call our main line at [phone] for an update on the status of any delays or closures.  Dermatology Medication Tips: Please keep the boxes that topical medications come in in order to help keep track  of the instructions about where and how to use these. Pharmacies typically print the medication instructions only on the boxes and not directly on the medication tubes.   If your medication is too expensive, please contact our office at [phone] or send us  a message through MyChart.   We are unable to tell what your co-pay for medications will be in advance as this is different  depending on your insurance coverage. However, we may be able to find a substitute medication at lower cost or fill out paperwork to get insurance to cover a needed medication.   If a prior authorization is required to get your medication covered by your insurance company, please allow us  1-2 business days to complete this process.  Drug prices often vary depending on where the prescription is filled and some pharmacies may offer cheaper prices.  The website www.goodrx.com contains coupons for medications through different pharmacies. The prices here do not account for what the cost may be with help from insurance (it may be cheaper with your insurance), but the website can give you the price if you did not use any insurance.  - You can print the associated coupon and take it with your prescription to the pharmacy.  - You may also stop by our office during regular business hours and pick up a GoodRx coupon card.  - If you need your prescription sent electronically to a different pharmacy, notify our office through [facility] or by phone at [phone]

## 2024-01-01 NOTE — Progress Notes (Signed)
 New Patient Visit   Subjective  [name] is a 63 y.o. female who presents for the following: New Pt - Hives  Patient states she has rash located at the face and neck that she would like to have examined. Patient reports the areas have been there for on and off for 5 years. She reports the areas are bothersome.Patient rates irritation (itching) 10 out of 10. She states that the areas have spread at times under both breast. Patient reports she has previously been treated for these areas by PCP and was Rx Tacrolimus  and Triamcinolone  that helped . Pt has also used OTC Benadryl  for itching but it gives minimal relief. Patient denies Hx of bx. Patient denies family history of skin cancer(s).   The following portions of the chart were reviewed this encounter and updated as appropriate: medications, allergies, medical history  Review of Systems:  No other skin or systemic complaints except as noted in HPI or Assessment and Plan.  Objective  Well appearing patient in no apparent distress; mood and affect are within normal limits.  A focused examination was performed of the following areas: face & neck   Relevant exam findings are noted in the Assessment and Plan.  Right Malar Cheek (2) Verrucous papules   Assessment & Plan   URTICARIA Exam:   No active lesions today but given pt's description it is triggered by seafood  Urticaria or hives is a pink to red patchy whelp- like rash of the skin that typically itches and it is the result of histamine release in the skin.   Hives may have multiple causes including stress, medications, infections, and systemic illness.  Sometimes there is a family history of chronic urticaria.   "Physical urticarias" may be caused by pressure (dermatographism), heat, sun, cold, vibration.  Insect bites can cause "papular urticaria". It is often difficult to find the cause of generalized hives.  Statistically, 70% of the time a cause of  generalized hives is not found.  Sometimes hives can spontaneously resolve. Other times hives can persist and when it does, and no cause is found, and it has been at least 6 weeks since started, it is called "chronic idiopathic urticaria". Antihistamines are the mainstay for treatment.  In severe cases Xolair  injections may be used.   Treatment Plan Assessment: Patient with a 5-year history of recurrent rash on face and neck, diagnosed as urticaria. Triggers include seafood (particularly shrimp) and environmental factors (bugs, trees). The rash manifests as red, elevated, itchy hives, resolving within 2-3 days with triamcinolone  application. Triamcinolone  has been more effective than tacrolimus  in controlling symptoms.  Plan: Continue using triamcinolone  cream for acute flares, limiting use to 5-7 days on face to avoid adverse effects. Continue Zyrtec  and Allegra as needed. Avoid identified triggers (seafood, environmental factors). No refills needed at this time.   FILIFORM WART Exam: Located on bilateral cheeks  Counseling Discussed viral / HPV (Human Papilloma Virus) etiology and risk of spread /infectivity to other areas of body as well as to other people.  Multiple treatments and methods may be required to clear warts and it is possible treatment may not be successful.  Treatment risks include discoloration; scarring and there is still potential for wart recurrence.  Treatment Plan: - Cryotherapy done today   FILIFORM WARTS (2) Right Malar Cheek (2) Destruction of lesion - Right Malar Cheek Complexity: simple   Destruction method: cryotherapy   Informed consent: discussed and consent obtained   Timeout:  patient name,  date of birth, surgical site, and procedure verified Lesion destroyed using liquid nitrogen: Yes   Region frozen until ice ball extended beyond lesion: Yes   Outcome: patient tolerated procedure well with no complications   Post-procedure details: wound care  instructions given    No follow-ups on file.    Documentation: I have reviewed the above documentation for accuracy and completeness, and I agree with the above.   I, Shirron Louanne Roussel, CMA, am acting as scribe for [employer], DO.   Louana Roup, DO

## 2024-01-02 ENCOUNTER — Ambulatory Visit (INDEPENDENT_AMBULATORY_CARE_PROVIDER_SITE_OTHER): Payer: Medicare HMO | Admitting: Allergy & Immunology

## 2024-01-02 ENCOUNTER — Encounter: Payer: Self-pay | Admitting: Allergy & Immunology

## 2024-01-02 ENCOUNTER — Other Ambulatory Visit: Payer: Self-pay

## 2024-01-02 VITALS — BP 138/86 | HR 71 | Temp 98.2°F | Resp 18 | Ht 63.0 in | Wt 191.3 lb

## 2024-01-02 DIAGNOSIS — J3089 Other allergic rhinitis: Secondary | ICD-10-CM

## 2024-01-02 DIAGNOSIS — J4489 Other specified chronic obstructive pulmonary disease: Secondary | ICD-10-CM

## 2024-01-02 DIAGNOSIS — K219 Gastro-esophageal reflux disease without esophagitis: Secondary | ICD-10-CM

## 2024-01-02 DIAGNOSIS — L508 Other urticaria: Secondary | ICD-10-CM

## 2024-01-02 MED ORDER — OHTUVAYRE 3 MG/2.5ML IN SUSP: 3.0000 mg | Freq: Two times a day (BID) | RESPIRATORY_TRACT | 11 refills | Status: DC

## 2024-01-02 NOTE — Progress Notes (Signed)
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 Asthma-COPD overlap syndrome - Lung testing is worse today. - We will get on Ohtuvayre (ensifentrine), which is done twice daily via nebulizer.  - This is a newly approved medication that can help with your breathing using a different mechanism.  - Paperwork filled out today.  - Echocardiogram was NORMAL in 2024.  - We are going to continue with [facility] for now.  - Daily controller medication(s): Dupixent every two weeks and Breztri two puffs twice daily - Prior to physical activity: Ventolin (GRAY INHALER) 2 puffs 10-15 minutes before physical activity. - Rescue medications: albuterol nebulizer one vial every 4-6 hours as needed OR Ventolin (GRAY INHALER) 2 puffs AND 2 puffs Atrovent (GREEN INHALER) every 4-6 hours as needed - Asthma control goals:  * Full participation in all desired activities (may need albuterol before activity) * Albuterol use two time or less a week on average (not counting use with  activity) * Cough interfering with sleep two time or less a month * Oral steroids no more than once a year * No hospitalizations  2. Chronic urticaria - under good control - Continue with levocetirizine twice daily EVERY DAY.  - Continue with famotidine twice daily EVERY DAY.  - Continue with Doxepin 25mg  at night to help with itching AS NEEDED.  - Continue with triamcinolone twice daily as needed for your lesion.   3. 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 - Continue with allergy shots at the same schedule (come back next week so you start your allergy shots again).   4. Return in about 2 months (around 03/01/2024). You can have the follow up appointment with Dr. Dellis Anes or a Nurse Practicioner (our Nurse Practitioners are excellent and always have Physician oversight!).  Subjective:   [name] is a 63 y.o. 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   Acute exacerbation of chronic obstructive pulmonary disease (COPD) (HCC) 08/12/2023   Hypokalemia 08/12/2023   Hyperglycemia 08/12/2023   History of seizures 08/12/2023   Obesity (BMI 30-39.9) 08/12/2023   OSA (obstructive sleep apnea) 08/12/2023   Transaminitis 08/12/2023   COVID-19 virus infection 08/07/2023   Acute respiratory failure with hypoxia and hypercapnia (HCC)  06/23/2023   Peripheral edema 06/18/2023   Cirrhosis (HCC) 03/07/2023   Vocal cord dysfunction 03/05/2023   Urinary incontinence 01/25/2023   Influenza A 01/11/2023   Bilateral impacted cerumen 12/04/2022   Not well controlled severe persistent asthma 10/25/2022   Acute on chronic respiratory failure with hypoxemia (HCC) 10/07/2022   Abnormal TSH 09/13/2022   Chronic sinusitis 08/14/2022   PND (paroxysmal nocturnal dyspnea) 06/07/2022   Vitamin D deficiency 05/20/2022   Eczema 05/18/2022   Insomnia 05/18/2022   Allergic conjunctivitis 05/18/2022   Chronic respiratory failure with hypoxia and hypercapnia (HCC) 04/30/2022   Bilateral hearing loss 04/14/2022   Moderate persistent asthma without complication 02/02/2022   Low back pain 10/11/2021   Abnormal CXR 07/17/2021   Aortic atherosclerosis (HCC) 07/05/2021   URI (upper respiratory infection) 07/05/2021   Arthrodesis status 06/20/2021   Drug allergy 05/14/2021   Acute cough 05/14/2021   Bilateral leg weakness 04/17/2021   Status post cervical spinal fusion 12/27/2020   History of stroke 12/11/2020   Medication management 10/21/2020   Body mass index (BMI) 26.0-26.9, adult 08/23/2020   Healthcare maintenance 08/11/2020   Cervical spondylosis with myelopathy and radiculopathy 07/28/2020   Preop exam for internal medicine 06/19/2020   Hypotension due to medication 06/11/2020   Essential hypertension 05/17/2020   Other spondylosis with myelopathy, cervical region 02/16/2020   Cervical spondylosis 02/02/2020   Radiculopathy, cervical region 02/02/2020   Spondylolysis, cervical region 02/02/2020   Neck pain 02/02/2020   Leg cramping 08/12/2019   Laryngopharyngeal reflux (LPR) 05/26/2019   Sinus pressure 05/26/2019   Hypophosphatemia 01/07/2019   Vertigo 09/23/2018   Dysfunction of left eustachian tube 09/23/2018   Gait disorder 04/21/2018   Leukocytosis 04/15/2018   Nausea & vomiting 04/15/2018   Seizure disorder (HCC)     Prolapsed cervical intervertebral disc 03/11/2018   Cervical radiculopathy 02/18/2018   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   Syncope 09/04/2017   Constipation 09/04/2017   Nonintractable headache 09/04/2017   Leg swelling 07/26/2017   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   Hyperlipidemia 04/20/2017   Pedal edema 04/19/2017   Bipolar disorder (HCC) 09/19/2016   Facial pain 07/12/2016   Rash 06/14/2016   Migraine 01/25/2016   Idiopathic urticaria/pruritus 01/23/2016   Allergic rhinitis 01/23/2016   Oral candidiasis 01/23/2016   Greater trochanteric bursitis of both hips 09/08/2015   Chest pain 06/22/2015   Itching 06/22/2015   Bilateral knee pain 06/22/2015   Bilateral hip pain 06/22/2015   Chronic iron deficiency anemia 12/22/2014   Fatigue 12/21/2014   Rash and nonspecific skin eruption 08/24/2014   Intertrigo 08/07/2014   Dyspareunia in female 07/12/2014   Menopausal syndrome (hot flushes) 07/12/2014   History of tobacco abuse 07/12/2014   Dizziness 06/22/2014   Weakness 06/22/2014   Fibromyalgia 05/14/2014   Encounter for well adult exam with abnormal findings 04/22/2014   Recurrent boils 04/22/2014   Recurrent falls 04/22/2014   Peripheral vascular disease (HCC)    Depression    Anxiety    GERD without esophagitis    Prediabetes 02/23/2014   COPD exacerbation (HCC) 02/23/2014  Encounter for screening mammogram for high-risk patient 02/23/2014   Back pain 07/22/2013   COPD/asthma 08/24/2009   Headache(784.0) 08/24/2009   HTN (hypertension) 05/12/2009   Asthma-COPD overlap syndrome (HCC) 05/12/2009    History obtained from: chart review and patient.  Discussed the use of AI scribe software for clinical note transcription with the patient and/or guardian, who gave verbal consent to proceed.  [name] is a 63 y.o. female presenting for a follow up visit.  She was last seen in November 2024.  At that time, lung testing was looking a little bit better.   Echocardiogram was normal in 2024.  We talked about changing to [place].  She was on Dupixent every 2 weeks and Breztri 2 puffs twice daily.  We had originally changed her to Dupixent because she felt like it worked better.  For her urticaria, she was under good control with levocetirizine and famotidine as well as doxepin.  For her mixed rhinitis, she continue with Flonase as well as allergy shots.  Since last visit, she feels that she has been around the same. She really has not felt great since her last hospitalization.  Asthma/Respiratory Symptom History: She reports that she has been experiencing worsening shortness of breath despite adherence to her prescribed regimen of Breztri (two puffs twice a day) and Fasenra injections (two doses received to date). The shortness of breath is severe enough to limit her daily activities, such as walking through a store, cleaning, and making the bed. The patient also reports needing to use her inhaler more frequently than recommended due to the severity of her symptoms. She had to call an ambulance for a breathing treatment recently, which provided temporary relief.  She has a follow-up appointment with a pulmonologist scheduled for the near future. She follows with Dr. Delton Coombes, but is scheduled to see Rhunette Croft NP on February 5th.  She has been monitoring her oxygen at home, but it always stays in the high 90s.  She has never been offered oxygen but is not really sure if she needs it.  Of note, she has had an echocardiogram in March 2024 that was largely normal with normal ejection fraction and ventricular function.  She did have some calcification of her aortic valve, but no stenosis. She has never seen Cardiology at all.  Her last chest CT was in March 2024.  It showed findings consistent with COPD as well as aortic atherosclerosis and right coronary disease.  Full results shown below:   IMPRESSION: 1. No acute findings in the thorax. 2. Aortic  atherosclerosis, in addition to right coronary artery disease. Please note that although the presence of coronary artery calcium documents the presence of coronary artery disease, the severity of this disease and any potential stenosis cannot be assessed on this non-gated CT examination. Assessment for potential risk factor modification, dietary therapy or pharmacologic therapy may be warranted, if clinically indicated. 3. Diffuse bronchial wall thickening with mild to moderate centrilobular and paraseptal emphysema; imaging findings suggestive of underlying COPD. 4. Morphologic changes in the liver suggesting underlying cirrhosis.  Allergic Rhinitis Symptom History: She remains on the Flonase 1 spray per nostril twice daily.  She also is on allergy shots as well, but has not been coming consistently.  Skin Symptom History: She has a history of some skin issues. She recently saw Dr. Onalee Hua just yesterday.  She had some lesions on her face frozen off.  They have not fallen off yet.  She is also continue with triamcinolone for  urticarial flares.  GERD Symptom History: She had an endoscopy performed in November 2024 that showed a single nonbleeding angiectasia in the stomach as well as nodular mucosa in the duodenal bulb.  Otherwise, everything looked normal.  She remains on Protonix 40 mg twice daily and famotidine 40 mg at night.  Otherwise, there have been no changes to her past medical history, surgical history, family history, or social history.    Review of systems otherwise negative other than that mentioned in the HPI.    Objective:   Blood pressure 138/86, pulse 71, temperature 98.2 F (36.8 C), temperature source Temporal, resp. rate 18, height 5\' 3"  (1.6 m), weight 191 lb 4.8 oz (86.8 kg), SpO2 100%. Body mass index is 33.89 kg/m.    Physical Exam Vitals reviewed.  Constitutional:      Appearance: She is well-developed.     Comments: Very pleasant. Cooperative with the exam.   Breathing comfortably. Talking in full sentences. She seems somewhat anxious.   HENT:     Head: Normocephalic and atraumatic.     Right Ear: Tympanic membrane, ear canal and external ear normal.     Left Ear: Tympanic membrane, ear canal and external ear normal.     Nose: No nasal deformity, septal deviation, mucosal edema or rhinorrhea.     Right Turbinates: Enlarged, swollen and pale.     Left Turbinates: Enlarged, swollen and pale.     Right Sinus: No maxillary sinus tenderness or frontal sinus tenderness.     Left Sinus: No maxillary sinus tenderness or frontal sinus tenderness.     Comments: No nasal polyps noted.     Mouth/Throat:     Mouth: Mucous membranes are not pale and not dry.     Pharynx: Uvula midline.     Comments: Cobblestoning present in the posterior oropharynx.  Eyes:     General: Lids are normal. Allergic shiner present.        Right eye: No discharge.        Left eye: No discharge.     Conjunctiva/sclera: Conjunctivae normal.     Right eye: Right conjunctiva is not injected. No chemosis.    Left eye: Left conjunctiva is not injected. No chemosis.    Pupils: Pupils are equal, round, and reactive to light.  Cardiovascular:     Rate and Rhythm: Normal rate and regular rhythm.     Heart sounds: Normal heart sounds.  Pulmonary:     Effort: Pulmonary effort is normal. No tachypnea, accessory muscle usage or respiratory distress.     Breath sounds: Normal breath sounds. Decreased air movement present. No wheezing, rhonchi or rales.     Comments: No wheezing appreciated. Decreased air movement at the bases. No crackles.  Chest:     Chest wall: No tenderness.  Lymphadenopathy:     Cervical: No cervical adenopathy.  Skin:    General: Skin is warm.     Capillary Refill: Capillary refill takes less than 2 seconds.     Coloration: Skin is not pale.     Findings: No abrasion, erythema, petechiae or rash. Rash is not papular, urticarial or vesicular.     Comments: Skin  looks clear.   Neurological:     Mental Status: She is alert.  Psychiatric:        Behavior: Behavior is cooperative.      Diagnostic studies:    Spirometry: results abnormal (FEV1: 0.64/31%, FVC: 1.40/54%, FEV1/FVC: 46%).    Spirometry consistent with mixed obstructive  and restrictive disease. This is notable worse than the last time that I saw her.   Allergy Studies: none        Malachi Bonds, MD  [facility]

## 2024-01-02 NOTE — Patient Instructions (Addendum)
1. Asthma-COPD overlap syndrome - Lung testing is worse today. - We will get on Ohtuvayre (ensifentrine), which is done twice daily via nebulizer.  - This is a newly approved medication that can help with your breathing using a different mechanism.  - Paperwork filled out today.  - Echocardiogram was NORMAL in 2024.  - We are going to continue with [facility] for now.  - Daily controller medication(s): Dupixent every two weeks and Breztri two puffs twice daily - Prior to physical activity: Ventolin (GRAY INHALER) 2 puffs 10-15 minutes before physical activity. - Rescue medications: albuterol nebulizer one vial every 4-6 hours as needed OR Ventolin (GRAY INHALER) 2 puffs AND 2 puffs Atrovent (GREEN INHALER) every 4-6 hours as needed - Asthma control goals:  * Full participation in all desired activities (may need albuterol before activity) * Albuterol use two time or less a week on average (not counting use with activity) * Cough interfering with sleep two time or less a month * Oral steroids no more than once a year * No hospitalizations  2. Chronic urticaria - under good control - Continue with levocetirizine twice daily EVERY DAY.  - Continue with famotidine twice daily EVERY DAY.  - Continue with Doxepin 25mg  at night to help with itching AS NEEDED.  - Continue with triamcinolone twice daily as needed for your lesion.   3. 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 - Continue with allergy shots at the same schedule (come back next week so you start your allergy shots again).   4. Return in about 2 months (around 03/01/2024). You can have the follow up appointment with Dr. Dellis Anes or a Nurse Practicioner (our Nurse Practitioners are excellent and always have Physician oversight!).    Please inform us of any Emergency Department visits, hospitalizations, or changes in symptoms. Call us before going to the ED for  breathing or allergy symptoms since we might be able to fit you in for a sick visit. Feel free to contact us anytime with any questions, problems, or concerns.  It was a pleasure to see you again today! YOU are such a joy!   Websites that have reliable patient information: 1. American Academy of Asthma, Allergy, and Immunology: www.aaaai.org 2. Food Allergy Research and Education (FARE): foodallergy.org 3. Mothers of Asthmatics: http://www.asthmacommunitynetwork.org 4. American College of Allergy, Asthma, and Immunology: www.acaai.org      "Like" [place] on Facebook and Instagram for our latest updates!      A healthy democracy works best when [employer] participate! Make sure you are registered to vote! If you have moved or changed any of your contact information, you will need to get this updated before voting! Scan the QR codes below to learn more!

## 2024-01-03 ENCOUNTER — Telehealth: Payer: Self-pay

## 2024-01-03 MED ORDER — OHTUVAYRE 3 MG/2.5ML IN SUSP: 3.0000 mg | Freq: Two times a day (BID) | RESPIRATORY_TRACT | 11 refills | Status: DC

## 2024-01-03 NOTE — Telephone Encounter (Signed)
Ohtuvayre forms and medication script have been signed by Dr. Dellis Anes and faxed to [phone]. Forms have been labeled and will be place in pending Monday at the [facility] office.

## 2024-01-06 NOTE — Telephone Encounter (Signed)
Refaxed forms to PAP - forms have been placed in pending folder at nurse's station to make sure PAP receives fax w/no problems.

## 2024-01-21 ENCOUNTER — Ambulatory Visit: Payer: Medicare HMO

## 2024-01-21 DIAGNOSIS — R32 Unspecified urinary incontinence: Secondary | ICD-10-CM | POA: Diagnosis not present

## 2024-01-21 DIAGNOSIS — J4550 Severe persistent asthma, uncomplicated: Secondary | ICD-10-CM | POA: Diagnosis not present

## 2024-01-21 DIAGNOSIS — J449 Chronic obstructive pulmonary disease, unspecified: Secondary | ICD-10-CM | POA: Diagnosis not present

## 2024-01-21 MED ORDER — BENRALIZUMAB 30 MG/ML ~~LOC~~ SOSY
30.0000 mg | PREFILLED_SYRINGE | SUBCUTANEOUS | Status: AC
  Administered 2024-01-21 – 2024-12-01 (×7): 30 mg via SUBCUTANEOUS

## 2024-01-22 ENCOUNTER — Ambulatory Visit: Payer: Medicare HMO | Admitting: Nurse Practitioner

## 2024-01-22 ENCOUNTER — Encounter: Payer: Self-pay | Admitting: Nurse Practitioner

## 2024-01-22 VITALS — BP 118/74 | HR 70 | Temp 98.2°F | Ht 63.0 in | Wt 190.4 lb

## 2024-01-22 DIAGNOSIS — J4489 Other specified chronic obstructive pulmonary disease: Secondary | ICD-10-CM

## 2024-01-22 DIAGNOSIS — I7090 Unspecified atherosclerosis: Secondary | ICD-10-CM | POA: Diagnosis not present

## 2024-01-22 DIAGNOSIS — Z87891 Personal history of nicotine dependence: Secondary | ICD-10-CM | POA: Diagnosis not present

## 2024-01-22 DIAGNOSIS — R0683 Snoring: Secondary | ICD-10-CM

## 2024-01-22 DIAGNOSIS — J9612 Chronic respiratory failure with hypercapnia: Secondary | ICD-10-CM | POA: Diagnosis not present

## 2024-01-22 DIAGNOSIS — J9611 Chronic respiratory failure with hypoxia: Secondary | ICD-10-CM | POA: Diagnosis not present

## 2024-01-22 DIAGNOSIS — G478 Other sleep disorders: Secondary | ICD-10-CM

## 2024-01-22 DIAGNOSIS — G4719 Other hypersomnia: Secondary | ICD-10-CM

## 2024-01-22 DIAGNOSIS — I700 Atherosclerosis of aorta: Secondary | ICD-10-CM | POA: Diagnosis not present

## 2024-01-22 NOTE — Progress Notes (Signed)
 @Patient  ID: [name], female    DOB: 26-May-1961, 63 y.o.   MRN: [id number]  Chief Complaint  Patient presents with   Follow-up    Not using CPAP.  Gets anxiety and can't breathe with CPAP mask on.  SOB with exertion persistent since hospital admit August 2024.    Referring provider: Norleen Lynwood ORN, MD  HPI: 63 year old female, former smoker followed for COPD asthma overlap.  She is a patient Dr. Lanny and last seen in office 10/09/2023.  Past medical history significant for allergic rhinitis, anxiety, depression, GERD, hypertension, PVD, prediabetes, seizure, history of stroke.  TEST/EVENTS:  06/10/2020 echocardiogram: EF 60-65%; diastolic parameters normal. RV size and function nl. Unable to measure PASP. Trivial MR.  03/08/2022 CT chest wo contrast: atherosclerosis. No LAD. Emphysematous disease is noted b/l. No suspicious nodules mentioned.  05/28/2022 CXR 1 view: lungs are mildly hyperinflated, progressive since previous. Lungs are clear with no acute process.  12/05/2022 PFT: FVC 45, FEV1 28, ratio 52, TLC 107, DLCOcor 49. Positive BD (27%) 02/22/2023 CT chest wo con: atherosclerosis. No LAD. No acute process. Diffuse bronchial wall thickening with mild to moderate emphysema. Underlying cirrhosis.  02/24/2023 echo: EF 60-65%. Normal diastolic parameters. RV size and function nl. Nl PASP.   03/05/2023: OV with Dr. Shelah. Asthmatic/COPD and frequent exacerbations. Upper airway instability and obesity, GERD, chronic rhinitis.  She also has chronic hypercapnic respiratory failure which she is treated with BiPAP nightly.  She is in the hospital for acute exacerbation, discharged on 02/27/2023.  She was treated with Yupelri , budesonide  and Brovana  while in the hospital.  Also on daily pantoprazole .  Remains on Dupixent  every 14 days as well as Xyzal , Flonase , Singulair , Pepcid .  Maintenance regimen includes Breo history.  Using DuoNeb approximately every other day.  Completed  prednisone  taper.  SLP evaluation today> laryngeal spasm on their eval.  They did work on some relaxation techniques.  Unclear how much recent hospitalization exacerbation was related to upper airway instability versus her obstructive lung disease.  Did require BiPAP.  Improved with standard therapy including steroids.  Encouraged to continue follow-up with SLP.  05/06/2023: Ov with Mckinley Adelstein NP for follow-up. She has been feeling about the same since she was here last. No hospitalizations or exacerbations. She continues to struggle with her breathing with most activities. She has been a little more breathless in the morning over the past few months. She is okay at rest. She is working on breathing techniques that SLP reviewed with her. These do help her a lot when she starts to feel short winded. She thinks it also calms her anxiety. She wants to know if there's any specific exercises she can do for her lungs. She does notice some occasional wheeze, usually only when she's really exerting herself. She is still having voice hoarseness, which tends to worsen with more coughing. ENT told her she had some inflammation and encouraged she continue reflux precautions. She doesn't feel like she's noticed a huge change. She denies any cough, fevers, chills, hemoptysis, leg swelling, orthopnea, PND, palpitations. Sinus symptoms are stable. She is using her nebs 1-2 times a day. She stopped using NIV because she felt like she couldn't breathe on it. Felt like it was pushing too much air. She had called [employer] who said someone would come out to help troubleshoot but no one ever did.   10/09/2023: OV with Dr. Shelah.  Upper airway resistance syndrome, vocal cord dysfunction.  Main contributor to recurrent flares.  Also  his underlying asthma which compounds the issue.  Working hard to decrease component of GERD and rhinitis.  Has seen ENT.  Has tried on exam, all upper airway obstruction without any clear evidence to support an  asthma flare.  Continue same bronchodilator regimen.  Encouraged her to try to get on BiPAP for chronic hypercarbia.  She has been unable to tolerate.  10/09/2023: Today - follow up Patient presents today for follow-up.  Since her last visit, she saw Dr. Iva with allergy  who started her on Ohtuvayre .  She started this yesterday.  She actually does tell me that she thinks that it is helping.  She feels like her breathing is a little bit better with it.  She does still have daily symptoms of dyspnea on relatively high symptom burden.  Does have some wheezing.  No significant cough.  Denies any fevers, chills, hemoptysis.  No lower extremity swelling or orthopnea. She does struggle with daily symptoms of fatigue.  Has had difficulties tolerating BiPAP.  Has never had a formal sleep study before.  Does have loud snoring at night.  Has been told that she stops breathing when she sleeps at night.  No issues with drowsy driving or morning headaches.  No sleep parasomnia/paralysis.  Allergies  Allergen Reactions   Sulfa Antibiotics Nausea And Vomiting and Other (See Comments)   Venofer  [Iron  Sucrose] Hives    Immunization History  Administered Date(s) Administered   Influenza Split 10/23/2011   Influenza Whole 09/16/2012   Influenza, Seasonal, Injecte, Preservative Fre 08/30/2023   Influenza,inj,Quad PF,6+ Mos 10/18/2014, 08/31/2015, 09/25/2016, 10/09/2017, 09/23/2018, 08/12/2019, 10/06/2020, 11/24/2021, 08/10/2022   PFIZER(Purple Top)SARS-COV-2 Vaccination 02/24/2020, 03/16/2020, 10/29/2020   PNEUMOCOCCAL CONJUGATE-20 06/15/2021   Pneumococcal Polysaccharide-23 12/02/2015   Td 04/22/2014   Zoster Recombinant(Shingrix) 11/26/2022, 02/18/2023   Zoster, Unspecified 11/19/2022    Past Medical History:  Diagnosis Date   Anemia, iron  deficiency 12/22/2014   pt. denies   Arthritis    ASTHMA 05/12/2009   Severe AFL (Spirometry 05/2009: pre-BD FEV1 0.87L 34% pred, post-BD FEV1 1.11L 44% pred)  Volumes hyperinflated Decreased DLCO that does not fully correct to normal range for alveolar volume.      Bipolar disorder (HCC)    with anxiety, depression   COPD (chronic obstructive pulmonary disease) (HCC)    Eczema 05/18/2022   Fibromyalgia 05/14/2014   GERD (gastroesophageal reflux disease)    History of kidney stones    Hyperlipidemia 04/20/2017   HYPERTENSION 05/12/2009   Qualifier: Diagnosis of  By: Primus LATHER LEODIS), [name]     Peripheral vascular disease (HCC)    Pneumonia    Prediabetes 02/23/2014   pt. denies   Seizure (HCC)    Stroke (HCC) 11/2020   Urticaria     Tobacco History: Social History   Tobacco Use  Smoking Status Former   Current packs/day: 0.00   Average packs/day: 0.5 packs/day for 40.0 years (20.0 ttl pk-yrs)   Types: Cigarettes   Start date: 42   Quit date: 04/2018   Years since quitting: 5.7   Passive exposure: Never  Smokeless Tobacco Never  Tobacco Comments   Patient states her quit date was 05/09/2018.  Patient educated with resources at today's appointment to continue to support her to stop smoking.   Counseling given: Not Answered Tobacco comments: Patient states her quit date was 05/09/2018.  Patient educated with resources at today's appointment to continue to support her to stop smoking.   Outpatient Medications Prior to Visit  Medication Sig Dispense Refill  albuterol  (PROVENTIL ) (2.5 MG/3ML) 0.083% nebulizer solution Take 3 mLs (2.5 mg total) by nebulization every 4 (four) hours as needed for wheezing or shortness of breath. 75 mL 1   albuterol  (VENTOLIN  HFA) 108 (90 Base) MCG/ACT inhaler INHALE 2 PUFFS INTO THE LUNGS EVERY 6 HOURS AS NEEDED FOR WHEEZING OR SHORTNESS OF BREATH 18 g 1   Albuterol -Budesonide  (AIRSUPRA ) 90-80 MCG/ACT AERO INHALE 2 PUFFS INTO THE LUNGS EVERY 6 HOURS AS NEEDED 10.7 g 0   ARIPiprazole  (ABILIFY ) 10 MG tablet Take 1 tablet (10 mg total) by mouth daily. Take Abilify  20 mg po daily for 2 weeks till 9/17 ,  then continue taking 10 mg po daily 30 tablet 1   atorvastatin  (LIPITOR) 40 MG tablet Take 1 tablet (40 mg total) by mouth daily. TAKE 1 TABLET(40 MG) BY MOUTH DAILY Strength: 40 mg 90 tablet 3   azelastine  (OPTIVAR ) 0.05 % ophthalmic solution Place 1 drop into both eyes 2 (two) times daily. 6 mL 12   benralizumab  (FASENRA  PEN) 30 MG/ML prefilled autoinjector Inject 1 mL (30 mg total) into the skin every 28 (twenty-eight) days. For 3 doses then every 8 weeks 1 mL 9   Budeson-Glycopyrrol-Formoterol  (BREZTRI  AEROSPHERE) 160-9-4.8 MCG/ACT AERO Inhale 2 puffs into the lungs in the morning and at bedtime. 10.7 g 11   budesonide  (PULMICORT ) 0.5 MG/2ML nebulizer solution Take 0.5 mg by nebulization daily.     carbamazepine  (TEGRETOL ) 200 MG tablet Take 2-3 tablets (400-600 mg total) by mouth See admin instructions. Take 400mg  in the morning and 600mg  at bedtime. 150 tablet 2   cetirizine  (ZYRTEC ) 10 MG tablet Can take one tablet by mouth once daily if needed for runny nose. 30 tablet 5   cromolyn  (OPTICROM ) 4 % ophthalmic solution Place 1 drop into both eyes 4 (four) times daily. 10 mL 12   doxepin  (SINEQUAN ) 25 MG capsule Take 1 capsule (25 mg total) by mouth at bedtime as needed. 30 capsule 5   Ensifentrine  (OHTUVAYRE ) 3 MG/2.5ML SUSP Inhale 3 mg into the lungs in the morning and at bedtime. 150 mL 11   estradiol  (ESTRACE ) 0.1 MG/GM vaginal cream Place 0.5 g vaginally 2 (two) times a week. Place 0.5g nightly for two weeks then twice a week after 30 g 11   famotidine  (PEPCID ) 40 MG tablet Take 1 tablet (40 mg total) by mouth 2 (two) times daily. 60 tablet 5   famotidine  (PEPCID ) 40 MG tablet Take 1 tablet (40 mg total) by mouth 2 (two) times daily. 60 tablet 5   ferrous sulfate  325 (65 FE) MG EC tablet TAKE 1 TABLET(325 MG) BY MOUTH DAILY WITH BREAKFAST 90 tablet 1   fluticasone  (FLONASE ) 50 MCG/ACT nasal spray Place 1 spray into both nostrils 2 (two) times daily as needed for allergies or rhinitis. 16 g 5    furosemide  (LASIX ) 20 MG tablet TAKE 1 TABLET BY MOUTH DAILY AS NEEDED 30 tablet 5   GEMTESA  75 MG TABS TAKE 1 TABLET(75 MG) BY MOUTH DAILY 30 tablet 2   hydrOXYzine  (VISTARIL ) 25 MG capsule Take 25 mg by mouth 2 (two) times daily as needed.     ipratropium (ATROVENT  HFA) 17 MCG/ACT inhaler Inhale 2 puffs into the lungs every 4 (four) hours as needed for wheezing. 12.9 g 5   irbesartan  (AVAPRO ) 75 MG tablet TAKE 1 TABLET(75 MG) BY MOUTH DAILY 30 tablet 0   levocetirizine (XYZAL ) 5 MG tablet Take 1 tablet (5 mg total) by mouth in the morning and at  bedtime. 60 tablet 5   montelukast  (SINGULAIR ) 10 MG tablet Take 1 tablet (10 mg total) by mouth at bedtime. 90 tablet 1   Nebulizers (PARI PRONEB MAX LC SPRINT) MISC      nystatin  (MYCOSTATIN ) 100000 UNIT/ML suspension Take 5 mLs (500,000 Units total) by mouth 4 (four) times daily. 473 mL 1   ondansetron  (ZOFRAN -ODT) 4 MG disintegrating tablet Take 1 tablet (4 mg total) by mouth every 8 (eight) hours as needed for nausea or vomiting. 30 tablet 1   pantoprazole  (PROTONIX ) 40 MG tablet TAKE 1 TABLET(40 MG) BY MOUTH TWICE DAILY 60 tablet 3   Skin Protectants, Misc. (EUCERIN) cream Apply 1 Application topically daily as needed for dry skin.     tacrolimus  (PROTOPIC ) 0.03 % ointment Apply 1 Application topically 2 (two) times daily as needed (rash). (Patient taking differently: Apply 1 Application topically 2 (two) times daily as needed (recurrent rash).) 60 g 5   triamcinolone  ointment (KENALOG ) 0.1 % Apply 1 Application topically at bedtime. 30 g 5   formoterol  (PERFOROMIST ) 20 MCG/2ML nebulizer solution Take 20 mcg by nebulization 2 (two) times daily. (Patient not taking: Reported on 01/22/2024)     Facility-Administered Medications Prior to Visit  Medication Dose Route Frequency Provider Last Rate Last Admin   benralizumab  (FASENRA ) prefilled syringe 30 mg  30 mg Subcutaneous Q8 Weeks Gallagher, Joel Louis, MD   30 mg at 01/21/24 1319     Review of  Systems:   Constitutional: No weight loss or gain, night sweats, fevers, chills, or lassitude. +fatigue HEENT: No headaches, difficulty swallowing, tooth/dental problems, or sore throat. No sneezing, itching, ear ache. +nasal congestion, post nasal drip (stable). +voice hoarseness (chronic) CV:  +occasional PND (baseline). No chest pain, orthopnea, swelling in lower extremities, anasarca, dizziness, palpitations, syncope Resp: +shortness of breath with exertion (slight improvement); occasional exertional wheeze. No excess mucus or change in color of mucus. No productive or non-productive. No hemoptysis. No chest wall deformity GI:  No heartburn, indigestion, abdominal pain, nausea, vomiting, diarrhea, change in bowel habits, loss of appetite, bloody stools.  Skin: No rash, lesions, ulcerations MSK:  No joint pain or swelling.  No decreased range of motion.   Neuro: No dizziness or lightheadedness.  Psych: No depression. +anxiety. No SI/HI. Mood stable.     Physical Exam:  BP 118/74 (BP Location: Right Arm, Patient Position: Sitting, Cuff Size: Large)   Pulse 70   Temp 98.2 F (36.8 C) (Oral)   Ht 5' 3 (1.6 m)   Wt 190 lb 6.4 oz (86.4 kg)   SpO2 98%   BMI 33.73 kg/m   GEN: Pleasant, interactive, well-appearing; in no acute distress. HEENT:  Normocephalic and atraumatic. PERRLA. Sclera white. Nasal turbinates pink, moist and patent bilaterally. No rhinorrhea present. Oropharynx pink and moist, without exudate or edema. No lesions, ulcerations, or postnasal drip. Mallampati III NECK:  Supple w/ fair ROM. No JVD present. Normal carotid impulses w/o bruits. Thyroid  symmetrical with no goiter or nodules palpated. No lymphadenopathy.   CV: RRR, no m/r/g, no peripheral edema. Pulses intact, +2 bilaterally. No cyanosis, pallor or clubbing. PULMONARY:  Unlabored, regular breathing. Diminished bilaterally A&P w/o wheezes/rales/rhonchi.  Upper airway pseudowheeze.  No accessory muscle use. No  dullness to percussion. GI: BS present and normoactive. Soft, non-tender to palpation. No organomegaly or masses detected.  MSK: No erythema, warmth or tenderness. Cap refil <2 sec all extrem. No deformities or joint swelling noted.  Neuro: A/Ox3. No focal deficits noted.   Skin:  Warm, no lesions or rashe Psych: Normal affect and behavior. Judgement and thought content appropriate.     Lab Results:  CBC    Component Value Date/Time   WBC 8.8 12/25/2023 1504   RBC 3.68 (L) 12/25/2023 1504   HGB 10.0 (L) 12/25/2023 1504   HGB 11.1 10/18/2022 1216   HCT 31.6 (L) 12/25/2023 1504   HCT 34.4 10/18/2022 1216   PLT 401.0 (H) 12/25/2023 1504   PLT 431 10/18/2022 1216   MCV 85.9 12/25/2023 1504   MCV 83 10/18/2022 1216   MCH 26.9 08/17/2023 0305   MCHC 31.6 12/25/2023 1504   RDW 16.6 (H) 12/25/2023 1504   RDW 18.5 (H) 10/18/2022 1216   LYMPHSABS 2.0 12/25/2023 1504   LYMPHSABS 2.3 10/18/2022 1216   MONOABS 0.9 12/25/2023 1504   EOSABS 0.0 12/25/2023 1504   EOSABS 0.1 10/18/2022 1216   BASOSABS 0.0 12/25/2023 1504   BASOSABS 0.0 10/18/2022 1216    BMET    Component Value Date/Time   NA 141 12/25/2023 1504   NA 144 07/31/2019 1151   K 3.5 12/25/2023 1504   CL 104 12/25/2023 1504   CO2 32 12/25/2023 1504   GLUCOSE 71 12/25/2023 1504   BUN 11 12/25/2023 1504   BUN 11 07/31/2019 1151   CREATININE 0.69 12/25/2023 1504   CREATININE 0.71 02/23/2014 1054   CALCIUM  9.0 12/25/2023 1504   GFRNONAA >60 08/17/2023 0305   GFRNONAA >89 02/23/2014 1054   GFRAA >60 07/19/2020 1113   GFRAA >89 02/23/2014 1054    BNP    Component Value Date/Time   BNP 26.8 08/05/2023 1542     Imaging:  No results found.  benralizumab  (FASENRA ) prefilled syringe 30 mg     Date Action Dose Route User   01/21/2024 1319 Given 30 mg Subcutaneous (Left Arm) Vinie Rosina SAILOR, LPN      benralizumab  (FASENRA ) prefilled syringe 30 mg     Date Action Dose Route User   12/24/2023 0940 Given 30 mg  Subcutaneous (Right Arm) Larita Ellen R, CMA      dupilumab  (DUPIXENT ) prefilled syringe 300 mg     Date Action Dose Route User   12/24/2023 0905 Given 300 mg Subcutaneous (Right Arm) Larita Ellen SAUNDERS, CMA          Latest Ref Rng & Units 12/05/2022    2:41 PM 05/02/2018    3:58 PM  PFT Results  FVC-Pre L 1.42  1.92   FVC-Predicted Pre % 45  75   FVC-Post L 1.68    FVC-Predicted Post % 54    Pre FEV1/FVC % % 48  55   Post FEV1/FCV % % 52    FEV1-Pre L 0.68  1.06   FEV1-Predicted Pre % 28  52   FEV1-Post L 0.87    DLCO uncorrected ml/min/mmHg 8.64    DLCO UNC% % 45    DLCO corrected ml/min/mmHg 9.38    DLCO COR %Predicted % 49    DLVA Predicted % 71    TLC L 5.12    TLC % Predicted % 107    RV % Predicted % 189      Lab Results  Component Value Date   NITRICOXIDE 19 01/14/2018        Assessment & Plan:   Asthma-COPD overlap syndrome Severe COPD/asthma overlap with high symptom burden. Recently started Ohtuvayre , which seems to be helping. No exacerbation today. She has a component of upper airway resistance, which contributes to her symptoms. Walk test  today without desaturations on room air. Continue aggressive maintenance regimen. May want to consider pulmonary rehab in the future   Patient Instructions  Continue Albuterol  inhaler 2 puffs or 3 mL neb every 6 hours as needed for shortness of breath or wheezing. Notify if symptoms persist despite rescue inhaler/neb use. Use neb before your Breztri   Continue Breztri  2 puffs Twice daily. Brush tongue and rinse mouth afterwards Continue allergy  pill as ordered by Dr. Iva  Continue flonase  nasal spray 1-2 sprays each nostril Twice daily for allergies/nasal congestion Continue dupixent  injections as scheduled Continue singulair  1 tab At bedtime  Continue pepcid   Continue protonix  Continue Ohtuvayre  2.5 mL neb Twice daily   Referral to lung cancer screening program - due for CT March  2025  Walk test today without desaturations   Attend follow up with Dr. Iva as scheduled  Referral to cardiology due to evidence of plaque buildup on imaging   Given your symptoms, I am concerned that you may have sleep disordered breathing with sleep apnea. You will need a sleep study for further evaluation. Someone will contact you to schedule this. You have had trouble with the BiPAP so we'll see what your sleep study shows and then may consider an in lab desensitization study   We discussed how untreated sleep apnea puts an individual at risk for cardiac arrhthymias, pulm HTN, DM, stroke and increases their risk for daytime accidents. We also briefly reviewed treatment options including weight loss, side sleeping position, oral appliance, CPAP therapy or referral to ENT for possible surgical options  Use caution when driving and pull over if you become sleepy.   Follow up in 6 weeks with Dr. Shelah or Izetta Malachy PIETY. If symptoms do not improve or worsen, please contact office for sooner follow up or seek emergency care.    Chronic respiratory failure with hypoxia and hypercapnia (HCC) No exertional hypoxia. Goal >88-90%. She has chronic hypercarbia and previously prescribed BiPAP therapy.  Has had difficulties tolerating this so she is not wearing it reliably.  She would also like to see about different mask options.  She does have symptoms of untreated sleep apnea.  Will have her complete a home sleep study off of BiPAP and consider titration study to see if we can get her more comfortable on therapy.  Excessive daytime sleepiness See above. She has snoring, excessive daytime sleepiness, nocturnal apneic events. BMI 33. Given this,  I am concerned she could have sleep disordered breathing with obstructive sleep apnea. She will need sleep study for further evaluation.    - discussed how weight can impact sleep and risk for sleep disordered breathing - discussed options to assist with  weight loss: combination of diet modification, cardiovascular and strength training exercises   - had an extensive discussion regarding the adverse health consequences related to untreated sleep disordered breathing - specifically discussed the risks for hypertension, coronary artery disease, cardiac dysrhythmias, cerebrovascular disease, and diabetes - lifestyle modification discussed   - discussed how sleep disruption can increase risk of accidents, particularly when driving - safe driving practices were discussed   Aortic atherosclerosis (HCC) Referred to cardiology   Former smoker Former smoker with 20 pack year history. Referred to lung cancer screening program.   Upper airway resistance syndrome See above. Continue aggressive GERD and postnasal drainage regimen     I spent 45 minutes of dedicated to the care of this patient on the date of this encounter to include pre-visit review of records, face-to-face time  with the patient discussing conditions above, post visit ordering of testing, clinical documentation with the electronic health record, making appropriate referrals as documented, and communicating necessary findings to members of the patients care team.  Comer LULLA Rouleau, NP 01/23/2024  Pt aware and understands NP's role.

## 2024-01-22 NOTE — Patient Instructions (Addendum)
 Continue Albuterol  inhaler 2 puffs or 3 mL neb every 6 hours as needed for shortness of breath or wheezing. Notify if symptoms persist despite rescue inhaler/neb use. Use neb before your Breztri   Continue Breztri  2 puffs Twice daily. Brush tongue and rinse mouth afterwards Continue allergy  pill as ordered by Dr. Iva  Continue flonase  nasal spray 1-2 sprays each nostril Twice daily for allergies/nasal congestion Continue dupixent  injections as scheduled Continue singulair  1 tab At bedtime  Continue pepcid   Continue protonix  Continue Ohtuvayre  2.5 mL neb Twice daily   Referral to lung cancer screening program - due for [place] March 2025  Walk test today without desaturations   Attend follow up with Dr. Iva as scheduled  Referral to cardiology due to evidence of plaque buildup on imaging   Given your symptoms, I am concerned that you may have sleep disordered breathing with sleep apnea. You will need a sleep study for further evaluation. Someone will contact you to schedule this. You have had trouble with the BiPAP so we'll see what your sleep study shows and then may consider an in lab desensitization study   We discussed how untreated sleep apnea puts an individual at risk for cardiac arrhthymias, pulm HTN, DM, stroke and increases their risk for daytime accidents. We also briefly reviewed treatment options including weight loss, side sleeping position, oral appliance, CPAP therapy or referral to ENT for possible surgical options  Use caution when driving and pull over if you become sleepy.   Follow up in 6 weeks with Dr. Shelah or [name]. If symptoms do not improve or worsen, please contact office for sooner follow up or seek emergency care.

## 2024-01-23 ENCOUNTER — Other Ambulatory Visit: Payer: Self-pay | Admitting: Internal Medicine

## 2024-01-23 ENCOUNTER — Other Ambulatory Visit: Payer: Self-pay | Admitting: Allergy & Immunology

## 2024-01-23 ENCOUNTER — Encounter: Payer: Self-pay | Admitting: Nurse Practitioner

## 2024-01-23 DIAGNOSIS — G4719 Other hypersomnia: Secondary | ICD-10-CM | POA: Insufficient documentation

## 2024-01-23 DIAGNOSIS — G478 Other sleep disorders: Secondary | ICD-10-CM | POA: Insufficient documentation

## 2024-01-23 DIAGNOSIS — Z87891 Personal history of nicotine dependence: Secondary | ICD-10-CM | POA: Insufficient documentation

## 2024-01-23 MED ORDER — BENRALIZUMAB 30 MG/ML ~~LOC~~ SOSY
30.0000 mg | PREFILLED_SYRINGE | Freq: Once | SUBCUTANEOUS | Status: AC
  Administered 2023-12-24: 30 mg via SUBCUTANEOUS

## 2024-01-23 NOTE — Assessment & Plan Note (Signed)
 Referred to cardiology

## 2024-01-23 NOTE — Assessment & Plan Note (Signed)
 Former smoker with 20 pack year history. Referred to lung cancer screening program.

## 2024-01-23 NOTE — Assessment & Plan Note (Addendum)
 See above. Continue aggressive GERD and postnasal drainage regimen

## 2024-01-23 NOTE — Assessment & Plan Note (Signed)
 No exertional hypoxia. Goal >88-90%. She has chronic hypercarbia and previously prescribed BiPAP therapy.  Has had difficulties tolerating this so she is not wearing it reliably.  She would also like to see about different mask options.  She does have symptoms of untreated sleep apnea.  Will have her complete a home sleep study off of BiPAP and consider titration study to see if we can get her more comfortable on therapy.

## 2024-01-23 NOTE — Assessment & Plan Note (Signed)
 See above. She has snoring, excessive daytime sleepiness, nocturnal apneic events. BMI 33. Given this,  I am concerned she could have sleep disordered breathing with obstructive sleep apnea. She will need sleep study for further evaluation.    - discussed how weight can impact sleep and risk for sleep disordered breathing - discussed options to assist with weight loss: combination of diet modification, cardiovascular and strength training exercises   - had an extensive discussion regarding the adverse health consequences related to untreated sleep disordered breathing - specifically discussed the risks for hypertension, coronary artery disease, cardiac dysrhythmias, cerebrovascular disease, and diabetes - lifestyle modification discussed   - discussed how sleep disruption can increase risk of accidents, particularly when driving - safe driving practices were discussed

## 2024-01-23 NOTE — Assessment & Plan Note (Signed)
 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 Recently started Ohtuvayre , which seems to be helping. No exacerbation today. She has a component of upper airway resistance, which contributes to her symptoms. Walk test today without desaturations on room air. Continue aggressive maintenance regimen. May want to consider pulmonary rehab in the future   Patient Instructions  Continue Albuterol  inhaler 2 puffs or 3 mL neb every 6 hours as needed for shortness of breath or wheezing. Notify if symptoms persist despite rescue inhaler/neb use. Use neb before your Breztri   Continue Breztri  2 puffs Twice daily. Brush tongue and rinse mouth afterwards Continue allergy  pill as ordered by Dr. Iva  Continue flonase  nasal spray 1-2 sprays each nostril Twice daily for allergies/nasal congestion Continue dupixent  injections as scheduled Continue singulair  1 tab At bedtime  Continue pepcid   Continue protonix  Continue Ohtuvayre  2.5 mL neb Twice daily   Referral to lung cancer screening program - due for [place] March 2025  Walk test today without desaturations   Attend follow up with Dr. Iva as scheduled  Referral to cardiology due to evidence of plaque buildup on imaging   Given your symptoms, I am concerned that you may have sleep disordered breathing with sleep apnea. You will need a sleep study for further evaluation. Someone will contact you to schedule this. You have had trouble with the BiPAP so we'll see what your sleep study shows and then may consider an in lab desensitization study   We discussed how untreated sleep apnea puts an individual at risk for cardiac arrhthymias, pulm HTN, DM, stroke and increases their risk for daytime accidents. We also briefly reviewed treatment options including weight loss, side sleeping position, oral appliance, CPAP therapy or referral to ENT for possible surgical options  Use caution when driving and pull over if you become sleepy.   Follow up in 6  weeks with Dr. Shelah or Izetta Malachy PIETY. If symptoms do not improve or worsen, please contact office for sooner follow up or seek emergency care.

## 2024-01-23 NOTE — Addendum Note (Signed)
 Addended by: FERNANDEZ-VERNON, Inari Shin R on: 01/23/2024 09:41 AM   Modules accepted: Orders

## 2024-01-24 ENCOUNTER — Other Ambulatory Visit: Payer: Self-pay

## 2024-01-27 DIAGNOSIS — G4730 Sleep apnea, unspecified: Secondary | ICD-10-CM | POA: Diagnosis not present

## 2024-01-27 DIAGNOSIS — G4719 Other hypersomnia: Secondary | ICD-10-CM

## 2024-01-27 DIAGNOSIS — R0683 Snoring: Secondary | ICD-10-CM

## 2024-02-03 ENCOUNTER — Ambulatory Visit (INDEPENDENT_AMBULATORY_CARE_PROVIDER_SITE_OTHER): Payer: Medicare HMO | Admitting: Diagnostic Neuroimaging

## 2024-02-03 ENCOUNTER — Encounter: Payer: Self-pay | Admitting: Diagnostic Neuroimaging

## 2024-02-03 VITALS — BP 152/86 | HR 78 | Ht 63.0 in | Wt 187.0 lb

## 2024-02-03 DIAGNOSIS — G43E01 Chronic migraine with aura, not intractable, with status migrainosus: Secondary | ICD-10-CM

## 2024-02-03 DIAGNOSIS — R252 Cramp and spasm: Secondary | ICD-10-CM | POA: Diagnosis not present
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
## 2024-02-04 ENCOUNTER — Encounter: Payer: Self-pay | Admitting: Gastroenterology

## 2024-02-04 ENCOUNTER — Ambulatory Visit (INDEPENDENT_AMBULATORY_CARE_PROVIDER_SITE_OTHER): Payer: Medicare HMO | Admitting: Gastroenterology

## 2024-02-04 VITALS — BP 136/80 | HR 88 | Ht 62.0 in | Wt 189.2 lb

## 2024-02-04 DIAGNOSIS — K219 Gastro-esophageal reflux disease without esophagitis: Secondary | ICD-10-CM

## 2024-02-04 DIAGNOSIS — R12 Heartburn: Secondary | ICD-10-CM

## 2024-02-04 NOTE — Patient Instructions (Signed)
   _______________________________________________________  If your blood pressure at your visit was 140/90 or greater, please contact your primary care physician to follow up on this.  _______________________________________________________  If you are age 61 or older, your body mass index should be between 23-30. Your Body mass index is 34.61 kg/m. If this is out of the aforementioned range listed, please consider follow up with your Primary Care Provider.  If you are age 45 or younger, your body mass index should be between 19-25. Your Body mass index is 34.61 kg/m. If this is out of the aformentioned range listed, please consider follow up with your Primary Care Provider.   ________________________________________________________  The [facility] providers would like to encourage you to use [facility] to communicate with providers for non-urgent requests or questions.  Due to long hold times on the telephone, sending your provider a message by [facility] may be a faster and more efficient way to get a response.  Please allow 48 business hours for a response.  Please remember that this is for non-urgent requests.  _______________________________________________________ It was a pleasure to see you today!  Thank you for trusting me with your gastrointestinal care!

## 2024-02-04 NOTE — Progress Notes (Signed)
[facility] Progress Note  Chief Complaint: Heartburn  Subjective  Prior history    [name] is a 63 year old African-American female with a past medical history of asthma, COPD, fibromyalgia, CHF (02/24/2023 echo with LVEF 60-65%), GERD and stroke, known to Dr. Myrtie Neither, who was referred to me by Corwin Levins, MD for a complaint of reflux and asthma.     06/09/2021 EGD with esophageal plaques found suspicious for candidiasis, nonbleeding gastric ulcers, gastric mucosal atrophy and otherwise normal.  At that time continued on Pantoprazole 40 mg daily and over-the-counter Omeprazole 20 mg with supper.  Started on iron.  Biopsies showed H. pylori infection, discussed previous treatment for this at [facility].  Recommended a 14-day course of Protonix 40 daily and Omeprazole 20 mg nightly as well as Rifabutin 300 mg daily x 14 and Amoxicillin 500 mg, 2 tabs twice daily.  Recommended breath test follow-up.    06/09/2021 colonoscopy with small lipoma in the proximal ascending colon, diverticulosis in the proximal ascending colon, redundant colon internal hemorrhoids.  Repeat recommended in 5 years given a family history of colon cancer.    06/03/2023 office visit with Dr. Myrtie Neither, at that time discussing possibility of cirrhosis.  CT scan did incidentally reported nodular liver contour but workup was negative.  Recent ultrasound of the elastography was not suggestive of cirrhosis.  At that time it was thought that based on her overall clinical picture she did not have cirrhosis.    09/05/2023 patient seen by otolaryngology Pollyann Kennedy) for reflux.  At that time discussed chronic asthma and reflux and they doubled up on Protonix and continued Pepcid.  Recommended referral to [place] for EGD and possible workup for reflux surgery.  Upper endoscopy 10/31/2023 notable for incidental small nonbleeding gastric AVM, otherwise normal study. Saw allergy clinic January 2025 (and before), started on some new medicine.   Allergic component asthma, rhinitis, urticaria  pulmonary clinic 01/22/2024 (and before), describing difficulties tolerating BiPAP.  Discussed the use of AI scribe software for clinical note transcription with the patient, who gave verbal consent to proceed.  History of Present Illness   [name] is a 63 year old female with heartburn and sleep apnea who presents for follow-up.  She has not experienced any flare-ups of heartburn since her last visit. Occasionally, she experiences a salty sensation coming up when sleeping but has not had any significant episodes. She avoids spicy foods as they cause upset stomach symptoms. No difficulty swallowing and her appetite is good.  She recently started using a CPAP mask for sleep apnea, beginning last week, and has used it for three nights. She describes some difficulty adjusting to the treatment. She has not obtained a bed wedge or similar device to elevate her head while sleeping.  She experiences itching and skin rashes, which are being managed by her allergist. She has been treated for head and neck allergies, which contribute to her symptoms. She also reports wheezing in the neck area.      ROS: Cardiovascular:  no chest pain Respiratory: Dyspnea  The patient's Past Medical, Family and Social History were reviewed and are on file in the EMR. Past Medical History:  Diagnosis Date   Anemia, iron deficiency 12/22/2014   pt. 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  Bipolar disorder (HCC)    with anxiety, depression   COPD (chronic obstructive pulmonary disease) (HCC)    Eczema 05/18/2022   Fibromyalgia 05/14/2014   GERD (gastroesophageal reflux disease)    History of kidney stones    Hyperlipidemia 04/20/2017   HYPERTENSION  05/12/2009   Qualifier: Diagnosis of  By: Truman Hayward Duncan Dull), [name]     Peripheral vascular disease (HCC)    Pneumonia    Prediabetes 02/23/2014   pt. denies   Seizure (HCC)    Stroke (HCC) 11/2020   Urticaria   From pulmonary clinic note 01/22/2024: Asthma-COPD overlap syndrome Severe COPD/asthma overlap with high symptom burden. Recently started [facility], which seems to be helping. No exacerbation today. She has a component of upper airway resistance, which contributes to her symptoms. Walk test today without desaturations on room air. Continue aggressive maintenance regimen. May want to consider pulmonary rehab in the future    Patient Instructions  Continue Albuterol inhaler 2 puffs or 3 mL neb every 6 hours as needed for shortness of breath or wheezing. Notify if symptoms persist despite rescue inhaler/neb use. Use neb before your Breztri  Continue Breztri 2 puffs Twice daily. Brush tongue and rinse mouth afterwards Continue allergy pill as ordered by Dr. Dellis Anes  Continue flonase nasal spray 1-2 sprays each nostril Twice daily for allergies/nasal congestion Continue dupixent injections as scheduled Continue singulair 1 tab At bedtime  Continue pepcid  Continue protonix Continue Ohtuvayre 2.5 mL neb Twice daily    Referral to lung cancer screening program - due for CT March 2025   Walk test today without desaturations   Past Surgical History:  Procedure Laterality Date   ABDOMINAL HYSTERECTOMY     ANTERIOR CERVICAL DECOMP/DISCECTOMY FUSION N/A 07/28/2020   Procedure: ANTERIOR CERVICAL DECOMPRESSION/DISCECTOMY FUSION. INTERBODY PROTHESIS, PLATE/SCREWS CERVICAL THREE- CERVICAL FOUR, CERVICAL FOUR- CERVICAL FIVE;  Surgeon: Tressie Stalker, MD;  Location: [facility] OR;  Service: Neurosurgery;  Laterality: N/A;   BACK SURGERY     COLONOSCOPY  12/20/2011   Procedure: COLONOSCOPY;  Surgeon: Freddy Jaksch, MD;  Location: [facility] END[facility]COPY;  Service: Endoscopy;  Laterality: N/A;   COLONOSCOPY   03/05/2012   Procedure: COLONOSCOPY;  Surgeon: Freddy Jaksch, MD;  Location: [facility] END[facility]COPY;  Service: Endoscopy;  Laterality: N/A;   DIAGNOSTIC LAPAROSCOPY     ESOPHAGOGASTRODUODENOSCOPY (EGD) WITH PROPOFOL N/A 10/31/2023   Procedure: ESOPHAGOGASTRODUODENOSCOPY (EGD) WITH PROPOFOL;  Surgeon: Sherrilyn Rist, MD;  Location: [facility] ENDOSCOPY;  Service: Gastroenterology;  Laterality: N/A;   HEMORRHOID SURGERY     INCISE AND DRAIN ABCESS     KIDNEY STONE SURGERY     NECK SURGERY     x 2 Dr Terrilee Files   SPINE SURGERY  2019   TOE SURGERY     TUBAL LIGATION       Objective:  Med list reviewed  Current Outpatient Medications:    albuterol (PROVENTIL) (2.5 MG/3ML) 0.083% nebulizer solution, Take 3 mLs (2.5 mg total) by nebulization every 4 (four) hours as needed for wheezing or shortness of breath., Disp: 75 mL, Rfl: 1   albuterol (VENTOLIN HFA) 108 (90 Base) MCG/ACT inhaler, INHALE 2 PUFFS INTO THE LUNGS EVERY 6 HOURS AS NEEDED FOR WHEEZING OR SHORTNESS OF BREATH, Disp: 18 g, Rfl: 1   Albuterol-Budesonide (AIRSUPRA) 90-80 MCG/ACT AERO, INHALE 2 PUFFS INTO THE LUNGS EVERY 6 HOURS AS NEEDED, Disp: 10.7 g, Rfl: 0   ARIPiprazole (ABILIFY) 10 MG tablet, Take 1 tablet (10 mg total) by mouth daily. Take Abilify 20  mg po daily for 2 weeks till 9/17 , then continue taking 10 mg po daily, Disp: 30 tablet, Rfl: 1   atorvastatin (LIPITOR) 40 MG tablet, Take 1 tablet (40 mg total) by mouth daily. TAKE 1 TABLET(40 MG) BY MOUTH DAILY Strength: 40 mg, Disp: 90 tablet, Rfl: 3   azelastine (OPTIVAR) 0.05 % ophthalmic solution, Place 1 drop into both eyes 2 (two) times daily., Disp: 6 mL, Rfl: 12   benralizumab (FASENRA PEN) 30 MG/ML prefilled autoinjector, Inject 1 mL (30 mg total) into the skin every 28 (twenty-eight) days. For 3 doses then every 8 weeks, Disp: 1 mL, Rfl: 9   Budeson-Glycopyrrol-Formoterol (BREZTRI AEROSPHERE) 160-9-4.8 MCG/ACT AERO, Inhale 2 puffs into the lungs in the morning and at bedtime.,  Disp: 10.7 g, Rfl: 11   budesonide (PULMICORT) 0.5 MG/2ML nebulizer solution, Take 0.5 mg by nebulization daily., Disp: , Rfl:    carbamazepine (TEGRETOL) 200 MG tablet, Take 2-3 tablets (400-600 mg total) by mouth See admin instructions. Take 400mg  in the morning and 600mg  at bedtime., Disp: 150 tablet, Rfl: 2   cetirizine (ZYRTEC) 10 MG tablet, Can take one tablet by mouth once daily if needed for runny nose., Disp: 30 tablet, Rfl: 5   cromolyn (OPTICROM) 4 % ophthalmic solution, Place 1 drop into both eyes 4 (four) times daily., Disp: 10 mL, Rfl: 12   doxepin (SINEQUAN) 25 MG capsule, Take 1 capsule (25 mg total) by mouth at bedtime as needed., Disp: 30 capsule, Rfl: 5   Ensifentrine (OHTUVAYRE) 3 MG/2.5ML SUSP, Inhale 3 mg into the lungs in the morning and at bedtime., Disp: 150 mL, Rfl: 11   estradiol (ESTRACE) 0.1 MG/GM vaginal cream, Place 0.5 g vaginally 2 (two) times a week. Place 0.5g nightly for two weeks then twice a week after, Disp: 30 g, Rfl: 11   famotidine (PEPCID) 40 MG tablet, Take 1 tablet (40 mg total) by mouth 2 (two) times daily., Disp: 60 tablet, Rfl: 5   ferrous sulfate 325 (65 FE) MG EC tablet, TAKE 1 TABLET(325 MG) BY MOUTH DAILY WITH BREAKFAST, Disp: 90 tablet, Rfl: 1   fluticasone (FLONASE) 50 MCG/ACT nasal spray, SHAKE LIQUID AND USE 1 SPRAY IN EACH NOSTRIL TWICE DAILY AS NEEDED FOR ALLERGIES OR RHINITIS, Disp: 16 g, Rfl: 5   formoterol (PERFOROMIST) 20 MCG/2ML nebulizer solution, Take 20 mcg by nebulization 2 (two) times daily., Disp: , Rfl:    furosemide (LASIX) 20 MG tablet, TAKE 1 TABLET BY MOUTH DAILY AS NEEDED, Disp: 30 tablet, Rfl: 5   GEMTESA 75 MG TABS, TAKE 1 TABLET(75 MG) BY MOUTH DAILY, Disp: 30 tablet, Rfl: 2   hydrOXYzine (VISTARIL) 25 MG capsule, Take 25 mg by mouth 2 (two) times daily as needed., Disp: , Rfl:    ipratropium (ATROVENT HFA) 17 MCG/ACT inhaler, Inhale 2 puffs into the lungs every 4 (four) hours as needed for wheezing., Disp: 12.9 g, Rfl: 5    irbesartan (AVAPRO) 75 MG tablet, TAKE 1 TABLET(75 MG) BY MOUTH DAILY, Disp: 30 tablet, Rfl: 0   levocetirizine (XYZAL) 5 MG tablet, Take 1 tablet (5 mg total) by mouth in the morning and at bedtime., Disp: 60 tablet, Rfl: 5   montelukast (SINGULAIR) 10 MG tablet, TAKE 1 TABLET(10 MG) BY MOUTH AT BEDTIME, Disp: 90 tablet, Rfl: 1   Nebulizers (PARI PRONEB MAX LC SPRINT) MISC, , Disp: , Rfl:    nystatin (MYCOSTATIN) 100000 UNIT/ML suspension, Take 5 mLs (500,000 Units total) by mouth 4 (four) times daily., Disp:  473 mL, Rfl: 1   ondansetron (ZOFRAN-ODT) 4 MG disintegrating tablet, Take 1 tablet (4 mg total) by mouth every 8 (eight) hours as needed for nausea or vomiting., Disp: 30 tablet, Rfl: 1   pantoprazole (PROTONIX) 40 MG tablet, TAKE 1 TABLET BY MOUTH EVERY DAY, Disp: 90 tablet, Rfl: 0   Skin Protectants, Misc. (EUCERIN) cream, Apply 1 Application topically daily as needed for dry skin., Disp: , Rfl:    tacrolimus (PROTOPIC) 0.03 % ointment, Apply 1 Application topically 2 (two) times daily as needed (rash). (Patient taking differently: Apply 1 Application topically 2 (two) times daily as needed (recurrent rash).), Disp: 60 g, Rfl: 5   triamcinolone ointment (KENALOG) 0.1 %, Apply 1 Application topically at bedtime., Disp: 30 g, Rfl: 5  Current Facility-Administered Medications:    benralizumab (FASENRA) prefilled syringe 30 mg, 30 mg, Subcutaneous, Q8 Weeks, Alfonse Spruce, MD, 30 mg at 01/21/24 1319   Vital signs in last 24 hrs: Vitals:   02/04/24 1439  BP: 136/80  Pulse: 88   Wt Readings from Last 3 Encounters:  02/04/24 189 lb 4 oz (85.8 kg)  02/03/24 187 lb (84.8 kg)  01/22/24 190 lb 6.4 oz (86.4 kg)    Physical Exam  Breathing comfortably on room air.  Audible expiratory wheezing. She also appears jittery today and is itchy "all over" HEENT: sclera anicteric, oral mucosa moist without lesions Neck: supple, no thyromegaly, JVD or lymphadenopathy Cardiac: Regular  without appreciable murmur,  no peripheral edema Pulm: clear to auscultation bilaterally, normal RR and effort noted Abdomen: soft, no tenderness, with active bowel sounds. No guarding or palpable hepatosplenomegaly. No obvious rash  Labs:   ___________________________________________ Radiologic studies:   ____________________________________________ Other:   _____________________________________________   Encounter Diagnosis  Name Primary?   Heartburn Yes    Assessment and Plan    Gastroesophageal Reflux Disease (GERD) No recent flare-ups of heartburn. Noted nocturnal reflux symptoms. Discussed the benefits of bed elevation to reduce reflux symptoms. -Consider purchasing a bed wedge to elevate the head during sleep.  It is impossible to know the extent to which any demonstrable reflux is contributing to this patient's chronic cough and dyspnea, which clearly has multifactorial causes as described above.  I suspect all of those other causes are much greater contributing factors than any reflux she is having.  (Asthma/COPD overlap syndrome with mixed obstructive/restrictive pattern, sleep apnea, allergic rhinitis)  Even if pH/impedance testing was done to quantify the reflux, it still would not tell us how much that is contributing to the cough.  I would not advocate an antireflux procedure such as TIF or fundoplication for this patient.  Continue current management, see [place] as needed  20 minutes were spent on this encounter (including chart review, history/exam, counseling/coordination of care, and documentation) > 50% of that time was spent on counseling and coordination of care.   Charlie Pitter III

## 2024-02-05 ENCOUNTER — Telehealth: Payer: Self-pay | Admitting: Allergy & Immunology

## 2024-02-05 DIAGNOSIS — F6381 Intermittent explosive disorder: Secondary | ICD-10-CM | POA: Diagnosis not present

## 2024-02-05 MED ORDER — HYDROXYZINE PAMOATE 25 MG PO CAPS: ORAL_CAPSULE | ORAL | 1 refills | Status: DC

## 2024-02-05 MED ORDER — PREDNISONE 10 MG PO TABS: ORAL_TABLET | ORAL | 0 refills | Status: DC

## 2024-02-05 NOTE — Telephone Encounter (Signed)
I am refilling her hydroxyzine and sending in a prednisone taper.

## 2024-02-05 NOTE — Telephone Encounter (Signed)
Left detailed message about meds being sent in and to call on call docs today if she has issues as we are closing office at 12pm today

## 2024-02-05 NOTE — Telephone Encounter (Signed)
Claudetta called in and states she has been itching since Saturday with no relief.  Arvetta states she has tried all the creams and has even finished an entire bottle of Benadryl and she just keeps itching.  Tya would like for you to call something in to help with the itching.  Savita uses [facility] on [address]

## 2024-02-08 ENCOUNTER — Telehealth: Payer: Self-pay | Admitting: Pulmonary Disease

## 2024-02-08 DIAGNOSIS — G4733 Obstructive sleep apnea (adult) (pediatric): Secondary | ICD-10-CM | POA: Diagnosis not present

## 2024-02-08 NOTE — Telephone Encounter (Signed)
 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  CPAP therapy if there is significant daytime sleepiness or other comorbidities including history of CVA or cardiac disease -If CPAP is chosen as an option of treatment auto titrating CPAP with a pressure setting of 5-15 will be appropriate  2.  Watchful waiting with emphasis on weight loss measures, sleep position modification to optimize lateral sleep, elevating the head of the bed by about 30 degrees may also help.  3.  An oral device may be fashioned for the treatment of mild sleep disordered breathing, will involve referral to dentist.   Follow-up as previously scheduled

## 2024-02-10 NOTE — Telephone Encounter (Signed)
 Called and spoke with patient, advised of results per Dr. Wynona Neat.  Scheduled next available for OV with Rhunette Croft NP 04/06/24 at 2:30 pm, advised to arrive by 2:15 pm for check in.  She verbalized understanding.  Nothing further needed.

## 2024-02-11 NOTE — Telephone Encounter (Signed)
 Heather, please move her appt up to this Friday 2/28 to review sleep study results. Virtual visit or in person at [place] office. Thanks

## 2024-02-13 ENCOUNTER — Ambulatory Visit: Payer: Self-pay

## 2024-02-13 NOTE — Patient Instructions (Signed)
 Visit Information  Thank you for taking time to visit with me today. Please don't hesitate to contact me if I can be of assistance to you.   Following are the goals we discussed today:  Continue to take medications as prescribed. Continue to attend provider visits as scheduled Continue to eat healthy, lean meats, vegetables, fruits, avoid saturated and transfats Contact provider with health questions or concerns as needed   Our next appointment is by telephone on 03/12/24 at 11:00 am  Please call the care guide team at [phone] if you need to cancel or reschedule your appointment.   If you are experiencing a Mental Health or Behavioral Health Crisis or need someone to talk to, please call the Suicide and Crisis Lifeline: 988 call the [place] National Suicide Prevention Lifeline: (651)527-3052 or TTY: 517-819-3340 TTY 208-229-5001) to talk to a trained counselor   Kathyrn Sheriff, RN, MSN, BSN, CCM [facility], Population Health Case Manager Phone: [phone]

## 2024-02-13 NOTE — Patient Outreach (Signed)
 Care Coordination   Follow Up Visit Note   02/13/2024 Name: Tranise Forrest MRN: [id number] DOB: February 14, 1961  Judson Roch Mcclees is a 63 y.o. year old female who sees Corwin Levins, MD for primary care. I spoke with  [name] by phone today.  What matters to the patients health and wellness today?  Ms. Capshaw reports, "I am doing pretty good. Completed OV with allergy specialist on  01/02/24; pulmonology on 01/22/24;neurology on 02/03/24 and gastroenterology on 02/04/24. Sleep study completed on 01/27/24. Patient denies any questions or concerns at this time.  Goals Addressed             This Visit's Progress    Maintain and/or improve health       Interventions Today    Flowsheet Row Most Recent Value  Chronic Disease   Chronic disease during today's visit Other, Chronic Obstructive Pulmonary Disease (COPD), Hypertension (HTN)  General Interventions   General Interventions Discussed/Reviewed General Interventions Reviewed, Doctor Visits  [Evaluation of current treatment plan for health condition and patient's adherence to plan.]  Doctor Visits Discussed/Reviewed PCP, Specialist, Doctor Visits Reviewed  PCP/Specialist Visits Compliance with follow-up visit  [reviewed upcoming/scheduled appointments. advised patient that pulmonology would like to see sooner than April to go over sleep study. patient to call to schedule appointment.]  Education Interventions   Education Provided Provided Education  Provided Verbal Education On When to see the doctor, Exercise, Medication, Other  [facility] reviewed provider instructions for: pulmonary ov 01/22/24,  allegy ov 01/02/24, neurology ov /02/03/24]  Pharmacy Interventions   Pharmacy Dicussed/Reviewed Pharmacy Topics Reviewed  [medications reviewed]           COMPLETED: Support with Health managment education       Interventions Today    Flowsheet Row Most Recent Value  Chronic Disease   Chronic disease during  today's visit Other, Chronic Obstructive Pulmonary Disease (COPD), Hypertension (HTN)  [asthma-COPD overlap]  General Interventions   General Interventions Discussed/Reviewed General Interventions Reviewed, Doctor Visits  [Evaluation of current treatment plan for health condition and patient's adherence to plan.]  Doctor Visits Discussed/Reviewed PCP, Specialist, Doctor Visits Reviewed  PCP/Specialist Visits Compliance with follow-up visit  [reviewed upcoming provider appointments]  Education Interventions   Education Provided Provided Education  [reviewed patient instructions per office visit with Dr. Gallagher(allergist) on 10/24/23]  Provided Verbal Education On When to see the doctor, Medication, Blood Sugar Monitoring, Nutrition  [advised to continue to eat healthy, attend provider visits as scheduled, take medicatiosn as prescribed, contact provider with health questions or concerns as needed.]  Pharmacy Interventions   Pharmacy Dicussed/Reviewed Pharmacy Topics Discussed           SDOH assessments and interventions completed:  No  Care Coordination Interventions:  Yes, provided   Follow up plan: Follow up call scheduled for 03/12/24    Encounter Outcome:  Patient Visit Completed   Kathyrn Sheriff, RN, MSN, BSN, CCM [place]  [facility], Population Health Case Manager Phone: [phone]

## 2024-02-17 DIAGNOSIS — R32 Unspecified urinary incontinence: Secondary | ICD-10-CM | POA: Diagnosis not present

## 2024-02-17 DIAGNOSIS — J449 Chronic obstructive pulmonary disease, unspecified: Secondary | ICD-10-CM | POA: Diagnosis not present

## 2024-02-17 NOTE — Telephone Encounter (Signed)
 Called and spoke with patient, schedule her a virtual visit with KC on 02/17/2024 at 3 pm, advised to log in at 2:45 pm.  I let her know she could long in either in her mychart account or by the link sent to her on her smart phone.  She verbalized understanding.  I cancelled her f/u with KC on 4/21.  Nothing further needed.

## 2024-02-19 DIAGNOSIS — F6381 Intermittent explosive disorder: Secondary | ICD-10-CM | POA: Diagnosis not present

## 2024-02-20 ENCOUNTER — Other Ambulatory Visit (HOSPITAL_COMMUNITY): Payer: Self-pay

## 2024-02-20 ENCOUNTER — Telehealth: Payer: Self-pay

## 2024-02-20 MED ORDER — ATROVENT HFA 17 MCG/ACT IN AERS: 2.0000 | INHALATION_SPRAY | RESPIRATORY_TRACT | 5 refills | Status: AC | PRN

## 2024-02-20 NOTE — Telephone Encounter (Signed)
 Patient called the [place] office back and has been notified of (Atrovent HFA) 17 mcg/act inhaler being approved and that the pharmacy has also been notified of the inhaler's approval.

## 2024-02-20 NOTE — Telephone Encounter (Signed)
 Called patient - DOB/NEED DPR verified - LMOVM to contact office regarding a medication.  If/When patient call back - please advise of below notation of PA approval on Ipratropium (Atrovent HFA) 17 mcg/act inhaler.

## 2024-02-20 NOTE — Telephone Encounter (Signed)
 Pharmacy notified of approval..Marland Kitchen

## 2024-02-20 NOTE — Telephone Encounter (Signed)
 Pharmacy Patient Advocate Encounter   Received notification from CoverMyMeds that prior authorization for Atrovent HFA 17MCG/ACT aerosol is required/requested.   Insurance verification completed.   The patient is insured through [employer] .   Per test claim: PA required; PA submitted to above mentioned insurance via CoverMyMeds Key/confirmation #/EOC [facility] Status is pending

## 2024-02-20 NOTE — Telephone Encounter (Signed)
 Pharmacy Patient Advocate Encounter  Received notification from [facility] that Prior Authorization for Atrovent HFA 17MCG/ACT aerosol has been APPROVED from 02-20-2024 to 12-16-2024   PA #/Case ID/Reference #: Neville Route

## 2024-02-21 ENCOUNTER — Telehealth: Admitting: Nurse Practitioner

## 2024-02-21 DIAGNOSIS — G4734 Idiopathic sleep related nonobstructive alveolar hypoventilation: Secondary | ICD-10-CM

## 2024-02-21 DIAGNOSIS — K219 Gastro-esophageal reflux disease without esophagitis: Secondary | ICD-10-CM

## 2024-02-21 DIAGNOSIS — J9611 Chronic respiratory failure with hypoxia: Secondary | ICD-10-CM

## 2024-02-21 DIAGNOSIS — J4489 Other specified chronic obstructive pulmonary disease: Secondary | ICD-10-CM

## 2024-02-21 DIAGNOSIS — Z789 Other specified health status: Secondary | ICD-10-CM

## 2024-02-21 DIAGNOSIS — J309 Allergic rhinitis, unspecified: Secondary | ICD-10-CM | POA: Diagnosis not present

## 2024-02-21 DIAGNOSIS — G4733 Obstructive sleep apnea (adult) (pediatric): Secondary | ICD-10-CM

## 2024-02-21 DIAGNOSIS — J9612 Chronic respiratory failure with hypercapnia: Secondary | ICD-10-CM

## 2024-02-21 NOTE — Progress Notes (Signed)
 Patient ID: [name], female     DOB: October 07, 1961, 63 y.o.      MRN: [id number]  No chief complaint on file.   Virtual Visit via Video Note  I connected with [name] on 02/24/24 at  3:00 PM EST by a video enabled telemedicine application and verified that I am speaking with the correct person using two identifiers.  Location: Patient: Home Provider: Office   I discussed the limitations of evaluation and management by telemedicine and the availability of in person appointments. The patient expressed understanding and agreed to proceed.  History of Present Illness: 63 year old female, former smoker followed for COPD asthma overlap.  She is a patient Dr. Kavin Leech and last seen in office 01/22/2024 by [facility] NP.  Past medical history significant for allergic rhinitis, anxiety, depression, GERD, hypertension, PVD, prediabetes, seizure, history of stroke.   TEST/EVENTS:  06/10/2020 echocardiogram: EF 60-65%; diastolic parameters normal. RV size and function nl. Unable to measure PASP. Trivial MR.  03/08/2022 CT chest wo contrast: atherosclerosis. No LAD. Emphysematous disease is noted b/l. No suspicious nodules mentioned.  05/28/2022 CXR 1 view: lungs are mildly hyperinflated, progressive since previous. Lungs are clear with no acute process.  12/05/2022 PFT: FVC 45, FEV1 28, ratio 52, TLC 107, DLCOcor 49. Positive BD (27%) 02/22/2023 CT chest wo con: atherosclerosis. No LAD. No acute process. Diffuse bronchial wall thickening with mild to moderate emphysema. Underlying cirrhosis.  02/24/2023 echo: EF 60-65%. Normal diastolic parameters. RV size and function nl. Nl PASP.  01/27/2024 HST: AHI 11.2, SpO2 low 74%   03/05/2023: OV with Dr. Delton Coombes. Asthmatic/COPD and frequent exacerbations. Upper airway instability and obesity, GERD, chronic rhinitis.  She also has chronic hypercapnic respiratory failure which she is treated with BiPAP nightly.  She is in the hospital for  acute exacerbation, discharged on 02/27/2023.  She was treated with Mikael Spray, budesonide and Brovana while in the hospital.  Also on daily pantoprazole.  Remains on Dupixent every 14 days as well as Xyzal, Flonase, Singulair, Pepcid.  Maintenance regimen includes Breo history.  Using DuoNeb approximately every other day.  Completed prednisone taper.  SLP evaluation today> laryngeal spasm on their eval.  They did work on some relaxation techniques.  Unclear how much recent hospitalization exacerbation was related to upper airway instability versus her obstructive lung disease.  Did require BiPAP.  Improved with standard therapy including steroids.  Encouraged to continue follow-up with SLP.   05/06/2023: Ov with Jeffre Enriques NP for follow-up. She has been feeling about the same since she was here last. No hospitalizations or exacerbations. She continues to struggle with her breathing with most activities. She has been a little more breathless in the morning over the past few months. She is okay at rest. She is working on breathing techniques that SLP reviewed with her. These do help her a lot when she starts to feel short winded. She thinks it also calms her anxiety. She wants to know if there's any specific exercises she can do for her lungs. She does notice some occasional wheeze, usually only when she's really exerting herself. She is still having voice hoarseness, which tends to worsen with more coughing. ENT told her she had some inflammation and encouraged she continue reflux precautions. She doesn't feel like she's noticed a huge change. She denies any cough, fevers, chills, hemoptysis, leg swelling, orthopnea, PND, palpitations. Sinus symptoms are stable. She is using her nebs 1-2 times a day. She stopped using NIV because she felt like  she couldn't breathe on it. Felt like it was pushing too much air. She had called [employer] who said someone would come out to help troubleshoot but no one ever did.    10/09/2023: OV  with Dr. Delton Coombes.  Upper airway resistance syndrome, vocal cord dysfunction.  Main contributor to recurrent flares.  Also his underlying asthma which compounds the issue.  Working hard to decrease component of GERD and rhinitis.  Has seen ENT.  Has tried on exam, all upper airway obstruction without any clear evidence to support an asthma flare.  Continue same bronchodilator regimen.  Encouraged her to try to get on BiPAP for chronic hypercarbia.  She has been unable to tolerate.   10/09/2023: OV with Edrik Rundle NP for follow-up.  Since her last visit, she saw Dr. Dellis Anes with allergy who started her on Ohtuvayre.  She started this yesterday.  She actually does tell me that she thinks that it is helping.  She feels like her breathing is a little bit better with it.  She does still have daily symptoms of dyspnea on relatively high symptom burden.  Does have some wheezing.  No significant cough.  Denies any fevers, chills, hemoptysis.  No lower extremity swelling or orthopnea. She does struggle with daily symptoms of fatigue.  Has had difficulties tolerating BiPAP.  Has never had a formal sleep study before.  Does have loud snoring at night.  Has been told that she stops breathing when she sleeps at night.  No issues with drowsy driving or morning headaches.  No sleep parasomnia/paralysis.  02/21/2024: Today - follow up Discussed the use of AI scribe software for clinical note transcription with the patient, who gave verbal consent to proceed.  History of Present Illness   The patient presents with sleep apnea and daytime sleepiness. She is accompanied by [name], a young family member.  She experiences symptoms of sleep apnea, including daytime sleepiness, loud snoring, and episodes of apnea during sleep. She has been using a BiPAP machine at home but has had difficulty tolerating it. A recent home sleep study off CPAP confirmed mild sleep apnea with oxygen levels dropping to 74%.  She reports improvement in her  breathing and ability to perform activities such as walking through [place], which she had not done in about a year. Previously, she would become severely out of breath, to the point of needing to sit and sometimes urinate in her clothes. She attributes some of this improvement to better control of her reflux.  We had added on famotidine at her last visit to her protonix. She also saw GI but isn't sure if they made any changes or if so, what it was. She's just on the famotidine and pantoprazole right now. Her cough has also improved, which she associates with better reflux control.   She did also start Ohtuvayre late January.       Allergies  Allergen Reactions   Sulfa Antibiotics Nausea And Vomiting and Other (See Comments)   Venofer [Iron Sucrose] Hives   Immunization History  Administered Date(s) Administered   Influenza Split 10/23/2011   Influenza Whole 09/16/2012   Influenza, Seasonal, Injecte, Preservative Fre 08/30/2023   Influenza,inj,Quad PF,6+ Mos 10/18/2014, 08/31/2015, 09/25/2016, 10/09/2017, 09/23/2018, 08/12/2019, 10/06/2020, 11/24/2021, 08/10/2022   PFIZER(Purple Top)SARS-COV-2 Vaccination 02/24/2020, 03/16/2020, 10/29/2020   PNEUMOCOCCAL CONJUGATE-20 06/15/2021   Pneumococcal Polysaccharide-23 12/02/2015   Td 04/22/2014   Zoster Recombinant(Shingrix) 11/26/2022, 02/18/2023   Zoster, Unspecified 11/19/2022   Past Medical History:  Diagnosis Date   Anemia,  iron deficiency 12/22/2014   pt. 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      Bipolar disorder (HCC)    with anxiety, depression   COPD (chronic obstructive pulmonary disease) (HCC)    Eczema 05/18/2022   Fibromyalgia 05/14/2014   GERD (gastroesophageal reflux disease)    History of kidney stones    Hyperlipidemia  04/20/2017   HYPERTENSION 05/12/2009   Qualifier: Diagnosis of  By: Truman Hayward Duncan Dull), [name]     Peripheral vascular disease (HCC)    Pneumonia    Prediabetes 02/23/2014   pt. denies   Seizure (HCC)    Stroke (HCC) 11/2020   Urticaria     Tobacco History: Social History   Tobacco Use  Smoking Status Former   Current packs/day: 0.00   Average packs/day: 0.5 packs/day for 40.0 years (20.0 ttl pk-yrs)   Types: Cigarettes   Start date: 70   Quit date: 04/2018   Years since quitting: 5.8   Passive exposure: Never  Smokeless Tobacco Never  Tobacco Comments   Patient states her quit date was 05/09/2018.  Patient educated with resources at today's appointment to continue to support her to stop smoking.   Counseling given: Not Answered Tobacco comments: Patient states her quit date was 05/09/2018.  Patient educated with resources at today's appointment to continue to support her to stop smoking.   Outpatient Medications Prior to Visit  Medication Sig Dispense Refill   albuterol (PROVENTIL) (2.5 MG/3ML) 0.083% nebulizer solution Take 3 mLs (2.5 mg total) by nebulization every 4 (four) hours as needed for wheezing or shortness of breath. 75 mL 1   albuterol (VENTOLIN HFA) 108 (90 Base) MCG/ACT inhaler INHALE 2 PUFFS INTO THE LUNGS EVERY 6 HOURS AS NEEDED FOR WHEEZING OR SHORTNESS OF BREATH 18 g 1   Albuterol-Budesonide (AIRSUPRA) 90-80 MCG/ACT AERO INHALE 2 PUFFS INTO THE LUNGS EVERY 6 HOURS AS NEEDED 10.7 g 0   ARIPiprazole (ABILIFY) 10 MG tablet Take 1 tablet (10 mg total) by mouth daily. Take Abilify 20 mg po daily for 2 weeks till 9/17 , then continue taking 10 mg po daily 30 tablet 1   atorvastatin (LIPITOR) 40 MG tablet Take 1 tablet (40 mg total) by mouth daily. TAKE 1 TABLET(40 MG) BY MOUTH DAILY Strength: 40 mg 90 tablet 3   azelastine (OPTIVAR) 0.05 % ophthalmic solution Place 1 drop into both eyes 2 (two) times daily. 6 mL 12   benralizumab (FASENRA PEN) 30 MG/ML prefilled  autoinjector Inject 1 mL (30 mg total) into the skin every 28 (twenty-eight) days. For 3 doses then every 8 weeks 1 mL 9   Budeson-Glycopyrrol-Formoterol (BREZTRI AEROSPHERE) 160-9-4.8 MCG/ACT AERO Inhale 2 puffs into the lungs in the morning and at bedtime. 10.7 g 11   budesonide (PULMICORT) 0.5 MG/2ML nebulizer solution Take 0.5 mg by nebulization daily.     carbamazepine (TEGRETOL) 200 MG tablet Take 2-3 tablets (400-600 mg total) by mouth See admin instructions. Take 400mg  in the morning and 600mg  at bedtime. 150 tablet 2   cetirizine (ZYRTEC) 10 MG tablet Can take one tablet by mouth once daily if needed for runny nose. 30 tablet 5   cromolyn (OPTICROM) 4 % ophthalmic solution Place 1 drop into both eyes 4 (four) times daily. 10 mL 12   doxepin (SINEQUAN) 25 MG  capsule Take 1 capsule (25 mg total) by mouth at bedtime as needed. 30 capsule 5   Ensifentrine (OHTUVAYRE) 3 MG/2.5ML SUSP Inhale 3 mg into the lungs in the morning and at bedtime. 150 mL 11   estradiol (ESTRACE) 0.1 MG/GM vaginal cream Place 0.5 g vaginally 2 (two) times a week. Place 0.5g nightly for two weeks then twice a week after 30 g 11   famotidine (PEPCID) 40 MG tablet Take 1 tablet (40 mg total) by mouth 2 (two) times daily. 60 tablet 5   ferrous sulfate 325 (65 FE) MG EC tablet TAKE 1 TABLET(325 MG) BY MOUTH DAILY WITH BREAKFAST 90 tablet 1   fluticasone (FLONASE) 50 MCG/ACT nasal spray SHAKE LIQUID AND USE 1 SPRAY IN EACH NOSTRIL TWICE DAILY AS NEEDED FOR ALLERGIES OR RHINITIS 16 g 5   formoterol (PERFOROMIST) 20 MCG/2ML nebulizer solution Take 20 mcg by nebulization 2 (two) times daily.     furosemide (LASIX) 20 MG tablet TAKE 1 TABLET BY MOUTH DAILY AS NEEDED 30 tablet 5   GEMTESA 75 MG TABS TAKE 1 TABLET(75 MG) BY MOUTH DAILY 30 tablet 2   hydrOXYzine (VISTARIL) 25 MG capsule Take one tablet every 6-8 hours as needed for itching. 30 capsule 1   ipratropium (ATROVENT HFA) 17 MCG/ACT inhaler Inhale 2 puffs into the lungs  every 4 (four) hours as needed for wheezing. 12.9 g 5   ipratropium (ATROVENT HFA) 17 MCG/ACT inhaler Inhale 2 puffs into the lungs every 4 (four) hours as needed for wheezing. 1 each 5   irbesartan (AVAPRO) 75 MG tablet TAKE 1 TABLET(75 MG) BY MOUTH DAILY 30 tablet 0   levocetirizine (XYZAL) 5 MG tablet Take 1 tablet (5 mg total) by mouth in the morning and at bedtime. 60 tablet 5   montelukast (SINGULAIR) 10 MG tablet TAKE 1 TABLET(10 MG) BY MOUTH AT BEDTIME 90 tablet 1   Nebulizers (PARI PRONEB MAX LC SPRINT) MISC      nystatin (MYCOSTATIN) 100000 UNIT/ML suspension Take 5 mLs (500,000 Units total) by mouth 4 (four) times daily. 473 mL 1   ondansetron (ZOFRAN-ODT) 4 MG disintegrating tablet Take 1 tablet (4 mg total) by mouth every 8 (eight) hours as needed for nausea or vomiting. 30 tablet 1   pantoprazole (PROTONIX) 40 MG tablet TAKE 1 TABLET BY MOUTH EVERY DAY 90 tablet 0   predniSONE (DELTASONE) 10 MG tablet Take one tablet (10mg ) twice daily for six days days, then one tablet (10mg ) once daily for six days, then STOP. 18 tablet 0   Skin Protectants, Misc. (EUCERIN) cream Apply 1 Application topically daily as needed for dry skin.     tacrolimus (PROTOPIC) 0.03 % ointment Apply 1 Application topically 2 (two) times daily as needed (rash). (Patient taking differently: Apply 1 Application topically 2 (two) times daily as needed (recurrent rash).) 60 g 5   triamcinolone ointment (KENALOG) 0.1 % Apply 1 Application topically at bedtime. 30 g 5   Facility-Administered Medications Prior to Visit  Medication Dose Route Frequency Provider Last Rate Last Admin   benralizumab (FASENRA) prefilled syringe 30 mg  30 mg Subcutaneous Q8 Thomes Dinning, MD   30 mg at 01/21/24 1319     Review of Systems:   Constitutional: No weight loss or gain, night sweats, fevers, chills, or lassitude. +fatigue  HEENT: No headaches, difficulty swallowing, tooth/dental problems, or sore throat. No  sneezing, itching, ear ache +nasal congestion, post nasal drip CV:  No chest pain, orthopnea, PND, swelling  in lower extremities, anasarca, dizziness, palpitations, syncope Resp: +snoring, witnessed apneas, improved shortness of breath with exertion, improved cough; rare wheeze. No excess mucus or change in color of mucus. No hemoptysis. No chest wall deformity GI:  +improved heartburn, indigestion. No abdominal pain, nausea, vomiting, diarrhea, change in bowel habits, loss of appetite, bloody stools.  GU: No dysuria, change in color of urine, urgency or frequency.   Skin: No rash, lesions, ulcerations MSK:  No joint pain or swelling.   Neuro: No dizziness or lightheadedness.  Psych: No depression or anxiety. Mood stable.   Observations/Objective: Patient is well-developed, well-nourished in no acute distress.  Resting comfortably at home.  No labored breathing.  Speech is clear and coherent with logical content.  Patient is alert and oriented at baseline.   Assessment and Plan:    Obstructive Sleep Apnea Mild obstructive sleep apnea confirmed by home sleep study, with symptoms of daytime sleepiness, loud snoring, and apneic episodes. Severe oxygen desaturations to 74% concerning for cardiac strain. Current BiPAP therapy is poorly tolerated. - Order in-lab CPAP titration study to determine optimal therapy - Consider switching from BiPAP to CPAP based on titration study results - Evaluate need for supplemental oxygen with PAP therapy - Discuss potential risks associated with untreated sleep apnea and treatment options - Cautioned on safe driving practices   Gastroesophageal Reflux Disease (GERD) GERD symptoms have improved with current management, leading to better breathing and increased activity tolerance. - Continue current GERD management with famotidine and Protonix - Follow up with GI as scheduled - GERD precautions   Former smoker  Due for lung cancer screening CT scan this  month as part of the screening program. - Follow up on referral for lung cancer screening CT scan   COPD/asthma Compensated on current regimen. Improvement in symptoms with inhaled phosphodiesterase inhibitor and better GERD management. No recent exacerbations. Continue aggressive maintenance regimen. Action plan in place. - Continue albuterol PRN - Continue Breztri 2 puffs bid - Continue Ohtuvayre 2.5 mL neb Twice daily  - Continue Dupixent injections  Allergic rhinitis Followed by allergy with Dr. Dellis Anes. Stable on current regimen - Continue allergy regimen   Chronic respiratory failure with hypoxia and hypercapnia No exertional hypoxia. Goal >88-90%. She has chronic hypercarbia and previously prescribed BiPAP therapy.  Has had difficulties tolerating this so she is not wearing it reliably.  - see above   Follow-up Plan to review results of in-lab CPAP titration study and lung cancer screening. - Schedule follow-up appointment in 10-12 weeks to review CPAP titration study results - Ensure she receives call to schedule in-lab titration study and lung cancer screening CT scan          I discussed the assessment and treatment plan with the patient. The patient was provided an opportunity to ask questions and all were answered. The patient agreed with the plan and demonstrated an understanding of the instructions.   The patient was advised to call back or seek an in-person evaluation if the symptoms worsen or if the condition fails to improve as anticipated.  I provided 35 minutes of non-face-to-face time during this encounter.   Noemi Chapel, NP

## 2024-02-24 ENCOUNTER — Telehealth: Payer: Self-pay | Admitting: Acute Care

## 2024-02-24 ENCOUNTER — Other Ambulatory Visit: Payer: Self-pay

## 2024-02-24 ENCOUNTER — Encounter: Payer: Self-pay | Admitting: Nurse Practitioner

## 2024-02-24 DIAGNOSIS — Z87891 Personal history of nicotine dependence: Secondary | ICD-10-CM

## 2024-02-24 DIAGNOSIS — Z122 Encounter for screening for malignant neoplasm of respiratory organs: Secondary | ICD-10-CM

## 2024-02-24 NOTE — Telephone Encounter (Signed)
 Lung Cancer Screening Narrative/Criteria Questionnaire (Cigarette Smokers Only- No Cigars/Pipes/vapes)   [name]   SDMV:03/10/24 at 0930am/ [facility]ST[facility]                                           08/14/1961                                              LDCT: 03/11/24 at 1140am/ [facility]    63 y.o.   Phone: [phone]  Lung Screening Narrative (confirm age 86-77 yrs Medicare / 50-80 yrs Private pay insurance)   Insurance information:[place] medicare/medicaid   Referring Provider:[facility]    This screening involves an initial phone call with a team member from our program. It is called a shared decision making visit. The initial meeting is required by insurance and Medicare to make sure you understand the program. This appointment takes about 15-20 minutes to complete. The CT scan will completed at a separate date/time. This scan takes about 5-10 minutes to complete and you may eat and drink before and after the scan.  Criteria questions for Lung Cancer Screening:   Are you a current or former smoker? Former Age began smoking: 63 yo   If you are a former smoker, what year did you quit smoking? 2019 (within 15 yrs)   To calculate your smoking history, I need an accurate estimate of how many packs of cigarettes you smoked per day and for how many years. (Not just the number of PPD you are now smoking)   Years smoking 48 x Packs per day 24 = Pack years 1/2   (at least 20 pack yrs)   (Make sure they understand that we need to know how much they have smoked in the past, not just the number of PPD they are smoking now)  Do you have a personal history of cancer?  No    Do you have a family history of cancer? Yes  (cancer type and and relative) sister/breast  Are you coughing up blood?  No  Have you had unexplained weight loss of 15 lbs or more in the last 6 months? No  It looks like you meet all criteria.     Additional information: N/A

## 2024-02-24 NOTE — Patient Instructions (Signed)
 Continue Albuterol inhaler 2 puffs or 3 mL neb every 6 hours as needed for shortness of breath or wheezing. Notify if symptoms persist despite rescue inhaler/neb use. Use neb before your Breztri  Continue Breztri 2 puffs Twice daily. Brush tongue and rinse mouth afterwards Continue allergy pill as ordered by Dr. Dellis Anes  Continue flonase nasal spray 1-2 sprays each nostril Twice daily for allergies/nasal congestion Continue dupixent injections as scheduled Continue singulair 1 tab At bedtime  Continue pepcid  Continue protonix Continue Ohtuvayre 2.5 mL neb Twice daily    I'll follow up on the referral to lung cancer screening program - due for CT March 2025   Attend follow up with Dr. Dellis Anes as scheduled   Your sleep study shows mild sleep apnea and moderately severe oxygen desaturations. We will have you complete an in lab titration study for further evaluation and determine appropriate therapy. Someone will contact you to schedule this.    We discussed how untreated sleep apnea puts an individual at risk for cardiac arrhthymias, pulm HTN, DM, stroke and increases their risk for daytime accidents. We also briefly reviewed treatment options including weight loss, side sleeping position, oral appliance, CPAP therapy or referral to ENT for possible surgical options   Use caution when driving and pull over if you become sleepy.   Follow up in 10-12 weeks with Dr. Delton Coombes or Philis Nettle. If symptoms do not improve or worsen, please contact office for sooner follow up or seek emergency care.

## 2024-03-03 ENCOUNTER — Ambulatory Visit (INDEPENDENT_AMBULATORY_CARE_PROVIDER_SITE_OTHER): Payer: Medicare HMO | Admitting: Allergy & Immunology

## 2024-03-03 ENCOUNTER — Other Ambulatory Visit: Payer: Self-pay

## 2024-03-03 VITALS — BP 130/84 | HR 82 | Temp 97.9°F

## 2024-03-03 DIAGNOSIS — J3089 Other allergic rhinitis: Secondary | ICD-10-CM

## 2024-03-03 DIAGNOSIS — B999 Unspecified infectious disease: Secondary | ICD-10-CM

## 2024-03-03 DIAGNOSIS — L508 Other urticaria: Secondary | ICD-10-CM

## 2024-03-03 DIAGNOSIS — J4489 Other specified chronic obstructive pulmonary disease: Secondary | ICD-10-CM

## 2024-03-03 DIAGNOSIS — K219 Gastro-esophageal reflux disease without esophagitis: Secondary | ICD-10-CM

## 2024-03-03 NOTE — Patient Instructions (Addendum)
 1. Asthma-COPD overlap syndrome - Lung testing is worse today. - But you are FEELING better, so I am not going to get too worked up about this.  - We will see what the sleep study shows.  - Daily controller medication(s): Breztri two puffs twice daily + Ohtuvayre nebulizer twice daily + Fasenra every month (spacing to every 8 weeks after 3 doses) - Prior to physical activity: Ventolin (GRAY INHALER) 2 puffs 10-15 minutes before physical activity. - Rescue medications: albuterol nebulizer one vial every 4-6 hours as needed OR Ventolin (GRAY INHALER) 2 puffs AND 2 puffs Atrovent (GREEN INHALER) every 4-6 hours as needed - Asthma control goals:  * Full participation in all desired activities (may need albuterol before activity) * Albuterol use two time or less a week on average (not counting use with activity) * Cough interfering with sleep two time or less a month * Oral steroids no more than once a year * No hospitalizations  2. Chronic urticaria - under good control - Continue with levocetirizine twice daily EVERY DAY.  - Continue with famotidine twice daily EVERY DAY.  - Continue with Doxepin 25mg  at night to help with itching AS NEEDED.  - Continue with triamcinolone twice daily as needed for your lesion.   3. 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 - Continue with allergy shots at the same schedule (come back next week so you start your allergy shots again).   4. Return in about 3 months (around 06/03/2024). You can have the follow up appointment with Dr. Dellis Anes or a Nurse Practicioner (our Nurse Practitioners are excellent and always have Physician oversight!).    Please inform us of any Emergency Department visits, hospitalizations, or changes in symptoms. Call us before going to the ED for breathing or allergy symptoms since we might be able to fit you in for a sick visit. Feel free to contact us anytime  with any questions, problems, or concerns.  It was a pleasure to see you again today! YOU are such a joy!   Websites that have reliable patient information: 1. American Academy of Asthma, Allergy, and Immunology: www.aaaai.org 2. Food Allergy Research and Education (FARE): foodallergy.org 3. Mothers of Asthmatics: http://www.asthmacommunitynetwork.org 4. American College of Allergy, Asthma, and Immunology: www.acaai.org      "Like" [place] on Facebook and Instagram for our latest updates!      A healthy democracy works best when [employer] participate! Make sure you are registered to vote! If you have moved or changed any of your contact information, you will need to get this updated before voting! Scan the QR codes below to learn more!

## 2024-03-03 NOTE — Progress Notes (Unsigned)
 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 Liver cirrhosis via chest CT (March 2024)    Perennial allergic rhinitis (dust mites) - on allergen immunotherapy   Chronic urticaria - controlled with antihistamines alone   Recurrent infections - with largely normal workup in 2020 and then lackluster Streptococcal titers in November 2023   Complicated past medical history, including disability as well as bipolar 1 depression and vitamin D deficiency   Urinary urgency - improved following visit with Urology  Plan/Recommendations:   Assessment and Plan              There are no Patient Instructions on file for this visit.   Subjective:   Dasha Kawabata is a 63 y.o. female presenting today for follow up of No chief complaint on file.   Judson Roch Menden has a history of the following: Patient Active Problem List   Diagnosis Date Noted  . Excessive daytime sleepiness 01/23/2024  . Former smoker 01/23/2024  . Upper airway resistance syndrome 01/23/2024  . Wheezing 12/02/2023  . Chronic cough 10/31/2023  . Peri-rectal abscess 10/13/2023  . Stridor 10/09/2023  . Nocturia 09/17/2023  . Urge urinary incontinence 09/17/2023  . 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 Hypokalemia 08/12/2023  . Hyperglycemia 08/12/2023  . History of seizures 08/12/2023  . Obesity (BMI 30-39.9) 08/12/2023  . Transaminitis 08/12/2023  . COVID-19 virus infection 08/07/2023  . Peripheral edema 06/18/2023   . Cirrhosis (HCC) 03/07/2023  . Vocal cord dysfunction 03/05/2023  . Urinary incontinence 01/25/2023  . Influenza A 01/11/2023  . Bilateral impacted cerumen 12/04/2022  . Not well controlled severe persistent asthma 10/25/2022  . Abnormal TSH 09/13/2022  . Chronic sinusitis 08/14/2022  . PND (paroxysmal nocturnal dyspnea) 06/07/2022  . Vitamin D deficiency 05/20/2022  . Eczema 05/18/2022  . Insomnia 05/18/2022  . Allergic conjunctivitis 05/18/2022  . Chronic respiratory failure with hypoxia and hypercapnia (HCC) 04/30/2022  . Bilateral hearing loss 04/14/2022  . Moderate persistent asthma without complication 02/02/2022  . Low back pain 10/11/2021  . Abnormal CXR 07/17/2021  . Aortic atherosclerosis (HCC) 07/05/2021  . URI (upper respiratory infection) 07/05/2021  . Arthrodesis status 06/20/2021  . Drug allergy 05/14/2021  . Acute cough 05/14/2021  . Bilateral leg weakness 04/17/2021  . Status post cervical spinal fusion 12/27/2020  . History of stroke 12/11/2020  . Medication management 10/21/2020  . Body mass index (BMI) 26.0-26.9, adult 08/23/2020  . Healthcare maintenance 08/11/2020  . Cervical spondylosis with myelopathy and radiculopathy 07/28/2020  . Preop exam for internal medicine 06/19/2020  . Hypotension due to medication 06/11/2020  . Essential hypertension 05/17/2020  . Other spondylosis with myelopathy, cervical region 02/16/2020  . Cervical spondylosis 02/02/2020  . Radiculopathy, cervical region 02/02/2020  . Spondylolysis, cervical region 02/02/2020  . Neck pain 02/02/2020  . Leg cramping 08/12/2019  . Laryngopharyngeal reflux (LPR) 05/26/2019  . Sinus pressure 05/26/2019  . Hypophosphatemia 01/07/2019  . Vertigo 09/23/2018  . Dysfunction of left eustachian tube 09/23/2018  . Gait disorder 04/21/2018  .  Leukocytosis 04/15/2018  . Nausea & vomiting 04/15/2018  . Seizure disorder (HCC)   . Prolapsed cervical intervertebral disc 03/11/2018  . Cervical  radiculopathy 02/18/2018  . 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 Syncope 09/04/2017  . Constipation 09/04/2017  . Nonintractable headache 09/04/2017  . Leg swelling 07/26/2017  . 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 Hyperlipidemia 04/20/2017  . Pedal edema 04/19/2017  . Bipolar disorder (HCC) 09/19/2016  . Facial pain 07/12/2016  . Rash 06/14/2016  . Migraine 01/25/2016  . Idiopathic urticaria/pruritus 01/23/2016  . Allergic rhinitis 01/23/2016  . Oral candidiasis 01/23/2016  . Greater trochanteric bursitis of both hips 09/08/2015  . Chest pain 06/22/2015  . Itching 06/22/2015  . Bilateral knee pain 06/22/2015  . Bilateral hip pain 06/22/2015  . Chronic iron deficiency anemia 12/22/2014  . Fatigue 12/21/2014  . Rash and nonspecific skin eruption 08/24/2014  . Intertrigo 08/07/2014  . Dyspareunia in female 07/12/2014  . Menopausal syndrome (hot flushes) 07/12/2014  . History of tobacco abuse 07/12/2014  . Dizziness 06/22/2014  . Weakness 06/22/2014  . Fibromyalgia 05/14/2014  . Encounter for well adult exam with abnormal findings 04/22/2014  . Recurrent boils 04/22/2014  . Recurrent falls 04/22/2014  . Peripheral vascular disease (HCC)   . Depression   . Anxiety   . GERD without esophagitis   . Prediabetes 02/23/2014  . COPD exacerbation (HCC) 02/23/2014  . Encounter for screening mammogram for high-risk patient 02/23/2014  . Back pain 07/22/2013  . COPD/asthma 08/24/2009  . Headache(784.0) 08/24/2009  . HTN (hypertension) 05/12/2009  . Asthma-COPD overlap syndrome (HCC) 05/12/2009    History obtained from: chart review and {Persons; PED relatives w/patient:19415::"patient"}.  Discussed the use of AI scribe software for clinical note transcription with the patient and/or guardian, who gave verbal consent to proceed.  Mario is a 63 y.o. female presenting for {Blank single:19197::"a food challenge","a drug challenge","skin testing","a sick visit","an  evaluation of ***","a follow up visit"}.  She was last seen in January 2025.  At that time, her like testing looked a little bit worse.  We got Ohtuvayre added to her regimen and continue with Dupixent every 2 weeks.  We also continue with Breztri 2 puffs twice daily.  She did have an echocardiogram that was normal in 2024.  For her hives, we continued with the levocetirizine as well as famotidine, doxepin, and triamcinolone.  For her mixed rhinitis, we continue with Flonase and her allergy shots.  Since last visit, she has done relatively well.   Discussed the use of AI scribe software for clinical note transcription with the patient, who gave verbal consent to proceed.  History of Present Illness            Asthma/Respiratory Symptom History: ***  Allergic Rhinitis Symptom History: ***  Food Allergy Symptom History: ***  Skin Symptom History: ***  GERD Symptom History: ***  Infection Symptom History: ***  Otherwise, there have been no changes to her past medical history, surgical history, family history, or social history.    Review of systems otherwise negative other than that mentioned in the HPI.    Objective:   There were no vitals taken for this visit. There is no height or weight on file to calculate BMI.    Physical Exam   Diagnostic studies:    Spirometry: results abnormal (FEV1: 0.58/28%, FVC: 1.17/45%, FEV1/FVC: 50%).    Spirometry consistent with mixed obstructive and restrictive disease. This is worse than previous spirometries.   {Blank single:19197::"Albuterol/Atrovent nebulizer","Xopenex/Atrovent nebulizer","Albuterol  nebulizer","Albuterol four puffs via MDI","Xopenex four puffs via MDI"} treatment given in clinic with {Blank single:19197::"significant improvement in FEV1 per ATS criteria","significant improvement in FVC per ATS criteria","significant improvement in FEV1 and FVC per ATS criteria","improvement in FEV1, but not significant per ATS  criteria","improvement in FVC, but not significant per ATS criteria","improvement in FEV1 and FVC, but not significant per ATS criteria","no improvement"}.  Allergy Studies: {Blank single:19197::"none","deferred due to recent antihistamine use","deferred due to insurance stipulations that require a separate visit for testing","labs sent instead"," "}    {Blank single:19197::"Allergy testing results were read and interpreted by myself, documented by clinical staff."," "}      Malachi Bonds, MD  [facility]

## 2024-03-04 DIAGNOSIS — F6381 Intermittent explosive disorder: Secondary | ICD-10-CM | POA: Diagnosis not present

## 2024-03-04 NOTE — Telephone Encounter (Signed)
 Oh goodness - let's back down to Yellow 0.1 and advance from there.   Malachi Bonds, MD [facility]

## 2024-03-04 NOTE — Telephone Encounter (Signed)
 Patient last received .05mL of her Green vial on 12/26/2023 and is wanting to get back on track with her allergy injections. Where would you like for me to back her down?

## 2024-03-05 ENCOUNTER — Telehealth: Payer: Self-pay | Admitting: Nurse Practitioner

## 2024-03-05 ENCOUNTER — Encounter: Payer: Self-pay | Admitting: *Deleted

## 2024-03-05 ENCOUNTER — Encounter: Payer: Self-pay | Admitting: Allergy & Immunology

## 2024-03-05 DIAGNOSIS — J9611 Chronic respiratory failure with hypoxia: Secondary | ICD-10-CM

## 2024-03-05 NOTE — Telephone Encounter (Signed)
 Called the number provided and there was msg stating that the call was unable to be completed  I called to discuss with the pt- there was no answer- LMTCB

## 2024-03-05 NOTE — Telephone Encounter (Signed)
 Katie-are you okay with order to d/c NIV since pt is not using?

## 2024-03-05 NOTE — Telephone Encounter (Signed)
 Yes ok to d/c NIV since she has improved and has difficulties tolerating. She's waiting on CPAP titration study for mild OSA/nocturnal hypoxia. Will decide on therapy following this. Thanks.

## 2024-03-05 NOTE — Telephone Encounter (Signed)
 Patient states that she is no longer using the ventilator and would like for [facility] to pick it up. She can be reached at [phone]

## 2024-03-05 NOTE — Telephone Encounter (Signed)
 Order to d/c NIV was placed. I informed the pt via mychart.

## 2024-03-05 NOTE — Telephone Encounter (Signed)
 [employer], Respiratory Therapist with [facility] states she spoke with this patient and she states she is no longer using her ventilator and she wants [facility] to pick it up----she will need an order from the provider   call back is [phone]

## 2024-03-06 ENCOUNTER — Encounter: Payer: Self-pay | Admitting: Internal Medicine

## 2024-03-06 ENCOUNTER — Ambulatory Visit (INDEPENDENT_AMBULATORY_CARE_PROVIDER_SITE_OTHER): Admitting: Internal Medicine

## 2024-03-06 VITALS — BP 128/76 | HR 80 | Temp 98.3°F | Ht 62.0 in | Wt 191.0 lb

## 2024-03-06 DIAGNOSIS — H9201 Otalgia, right ear: Secondary | ICD-10-CM | POA: Diagnosis not present

## 2024-03-06 DIAGNOSIS — R079 Chest pain, unspecified: Secondary | ICD-10-CM

## 2024-03-06 DIAGNOSIS — J441 Chronic obstructive pulmonary disease with (acute) exacerbation: Secondary | ICD-10-CM | POA: Diagnosis not present

## 2024-03-06 DIAGNOSIS — K219 Gastro-esophageal reflux disease without esophagitis: Secondary | ICD-10-CM

## 2024-03-06 MED ORDER — OMEPRAZOLE 40 MG PO CPDR
40.0000 mg | DELAYED_RELEASE_CAPSULE | Freq: Every day | ORAL | 3 refills | Status: AC
Start: 2024-03-06 — End: ?

## 2024-03-06 NOTE — Assessment & Plan Note (Signed)
 Mild to mod uncontrolled, for change protonix to omeprazole 40 mg every day, to f/u any worsening symptoms or concerns

## 2024-03-06 NOTE — Progress Notes (Addendum)
 Patient ID: [name], female   DOB: Sep 27, 1961, 63 y.o.   MRN: [id number]        Chief Complaint: follow up chest pain, gerd, right ear pain, copd       HPI:  [name] is a 63 y.o. female here with c/o 1 wk onset mild intermittent CP left side without radiation, but with nausea, sob, diaphoresis x 1 and dizziness but Pt denies wheezing, orthopnea, PND, increased LE swelling, palpitations, or syncope.   Pt denies polydipsia, polyuria, or new focal neuro s/s.    Pt denies fever, wt loss, night sweats, loss of appetite, or other constitutional symptoms   Has cardiology appt may 2025.  Has pulm imaging ordered soon as well.  Has also had mild worsening reflux, but this pain is different and no other abd pain, dysphagia, n/v, bowel change or blood.  Also incidentally with 1 wk intermittent sharp stabbing pains to the right ear with feeling congested, allergy symptoms.   Wt Readings from Last 3 Encounters:  03/06/24 191 lb (86.6 kg)  02/04/24 189 lb 4 oz (85.8 kg)  02/03/24 187 lb (84.8 kg)   BP Readings from Last 3 Encounters:  03/06/24 128/76  03/03/24 130/84  02/04/24 136/80         Past Medical History:  Diagnosis Date   Anemia, iron deficiency 12/22/2014   pt. 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      Bipolar disorder (HCC)    with anxiety, depression   COPD (chronic obstructive pulmonary disease) (HCC)    Eczema 05/18/2022   Fibromyalgia 05/14/2014   GERD (gastroesophageal reflux disease)    History of kidney stones    Hyperlipidemia 04/20/2017   HYPERTENSION 05/12/2009   Qualifier: Diagnosis of  By: Truman Hayward Duncan Dull), [name]     Peripheral vascular disease (HCC)    Pneumonia    Prediabetes 02/23/2014   pt. denies   Seizure (HCC)    Stroke (HCC)  11/2020   Urticaria    Past Surgical History:  Procedure Laterality Date   ABDOMINAL HYSTERECTOMY     ANTERIOR CERVICAL DECOMP/DISCECTOMY FUSION N/A 07/28/2020   Procedure: ANTERIOR CERVICAL DECOMPRESSION/DISCECTOMY FUSION. INTERBODY PROTHESIS, PLATE/SCREWS CERVICAL THREE- CERVICAL FOUR, CERVICAL FOUR- CERVICAL FIVE;  Surgeon: Tressie Stalker, MD;  Location: [facility] OR;  Service: Neurosurgery;  Laterality: N/A;   BACK SURGERY     COLONOSCOPY  12/20/2011   Procedure: COLONOSCOPY;  Surgeon: Freddy Jaksch, MD;  Location: [facility] ENDOSCOPY;  Service: Endoscopy;  Laterality: N/A;   COLONOSCOPY  03/05/2012   Procedure: COLONOSCOPY;  Surgeon: Freddy Jaksch, MD;  Location: [facility] ENDOSCOPY;  Service: Endoscopy;  Laterality: N/A;   DIAGNOSTIC LAPAROSCOPY     ESOPHAGOGASTRODUODENOSCOPY (EGD) WITH PROPOFOL N/A 10/31/2023   Procedure: ESOPHAGOGASTRODUODENOSCOPY (EGD) WITH PROPOFOL;  Surgeon: Sherrilyn Rist, MD;  Location: [facility] ENDOSCOPY;  Service: Gastroenterology;  Laterality: N/A;   HEMORRHOID SURGERY     INCISE AND DRAIN ABCESS     KIDNEY STONE SURGERY     NECK SURGERY     x 2 Dr Terrilee Files   SPINE SURGERY  2019   TOE SURGERY     TUBAL LIGATION      reports that she quit smoking about 5 years ago. Her smoking use included cigarettes. She started smoking about 46  years ago. She has a 20 pack-year smoking history. She has never been exposed to tobacco smoke. She has never used smokeless tobacco. She reports that she does not currently use alcohol. She reports that she does not currently use drugs after having used the following drugs: Marijuana. family history includes Alcohol abuse in her brother and father; Anemia in her daughter, sister, and sister; Anesthesia problems in her father; Asthma in her brother, mother, and sister; Brain cancer in her sister; Breast cancer (age of onset: 60) in her sister; COPD in her mother and sister; Colon cancer (age of onset: 9) in her father; Diabetes in her brother,  father, mother, sister, sister, sister, and sister; Heart disease in her brother, brother, mother, and sister; Hypertension in her brother, daughter, sister, sister, sister, sister, and sister; Kidney disease in her father; Sleep apnea in her brother. Allergies  Allergen Reactions   Sulfa Antibiotics Nausea And Vomiting and Other (See Comments)   Venofer [Iron Sucrose] Hives   Current Outpatient Medications on File Prior to Visit  Medication Sig Dispense Refill   albuterol (PROVENTIL) (2.5 MG/3ML) 0.083% nebulizer solution Take 3 mLs (2.5 mg total) by nebulization every 4 (four) hours as needed for wheezing or shortness of breath. 75 mL 1   albuterol (VENTOLIN HFA) 108 (90 Base) MCG/ACT inhaler INHALE 2 PUFFS INTO THE LUNGS EVERY 6 HOURS AS NEEDED FOR WHEEZING OR SHORTNESS OF BREATH 18 g 1   Albuterol-Budesonide (AIRSUPRA) 90-80 MCG/ACT AERO INHALE 2 PUFFS INTO THE LUNGS EVERY 6 HOURS AS NEEDED 10.7 g 0   ARIPiprazole (ABILIFY) 10 MG tablet Take 1 tablet (10 mg total) by mouth daily. Take Abilify 20 mg po daily for 2 weeks till 9/17 , then continue taking 10 mg po daily 30 tablet 1   atorvastatin (LIPITOR) 40 MG tablet Take 1 tablet (40 mg total) by mouth daily. TAKE 1 TABLET(40 MG) BY MOUTH DAILY Strength: 40 mg 90 tablet 3   azelastine (OPTIVAR) 0.05 % ophthalmic solution Place 1 drop into both eyes 2 (two) times daily. 6 mL 12   benralizumab (FASENRA PEN) 30 MG/ML prefilled autoinjector Inject 1 mL (30 mg total) into the skin every 28 (twenty-eight) days. For 3 doses then every 8 weeks 1 mL 9   Budeson-Glycopyrrol-Formoterol (BREZTRI AEROSPHERE) 160-9-4.8 MCG/ACT AERO Inhale 2 puffs into the lungs in the morning and at bedtime. 10.7 g 11   budesonide (PULMICORT) 0.5 MG/2ML nebulizer solution Take 0.5 mg by nebulization daily.     carbamazepine (TEGRETOL) 200 MG tablet Take 2-3 tablets (400-600 mg total) by mouth See admin instructions. Take 400mg  in the morning and 600mg  at bedtime. 150 tablet 2    cetirizine (ZYRTEC) 10 MG tablet Can take one tablet by mouth once daily if needed for runny nose. 30 tablet 5   cromolyn (OPTICROM) 4 % ophthalmic solution Place 1 drop into both eyes 4 (four) times daily. 10 mL 12   doxepin (SINEQUAN) 25 MG capsule Take 1 capsule (25 mg total) by mouth at bedtime as needed. 30 capsule 5   Ensifentrine (OHTUVAYRE) 3 MG/2.5ML SUSP Inhale 3 mg into the lungs in the morning and at bedtime. 150 mL 11   estradiol (ESTRACE) 0.1 MG/GM vaginal cream Place 0.5 g vaginally 2 (two) times a week. Place 0.5g nightly for two weeks then twice a week after 30 g 11   famotidine (PEPCID) 40 MG tablet Take 1 tablet (40 mg total) by mouth 2 (two) times daily. 60 tablet 5  ferrous sulfate 325 (65 FE) MG EC tablet TAKE 1 TABLET(325 MG) BY MOUTH DAILY WITH BREAKFAST 90 tablet 1   fluticasone (FLONASE) 50 MCG/ACT nasal spray SHAKE LIQUID AND USE 1 SPRAY IN EACH NOSTRIL TWICE DAILY AS NEEDED FOR ALLERGIES OR RHINITIS 16 g 5   formoterol (PERFOROMIST) 20 MCG/2ML nebulizer solution Take 20 mcg by nebulization 2 (two) times daily.     furosemide (LASIX) 20 MG tablet TAKE 1 TABLET BY MOUTH DAILY AS NEEDED 30 tablet 5   GEMTESA 75 MG TABS TAKE 1 TABLET(75 MG) BY MOUTH DAILY 30 tablet 2   hydrOXYzine (VISTARIL) 25 MG capsule Take one tablet every 6-8 hours as needed for itching. 30 capsule 1   ipratropium (ATROVENT HFA) 17 MCG/ACT inhaler Inhale 2 puffs into the lungs every 4 (four) hours as needed for wheezing. 12.9 g 5   ipratropium (ATROVENT HFA) 17 MCG/ACT inhaler Inhale 2 puffs into the lungs every 4 (four) hours as needed for wheezing. 1 each 5   irbesartan (AVAPRO) 75 MG tablet TAKE 1 TABLET(75 MG) BY MOUTH DAILY 30 tablet 0   levocetirizine (XYZAL) 5 MG tablet Take 1 tablet (5 mg total) by mouth in the morning and at bedtime. 60 tablet 5   montelukast (SINGULAIR) 10 MG tablet TAKE 1 TABLET(10 MG) BY MOUTH AT BEDTIME 90 tablet 1   Nebulizers (PARI PRONEB MAX LC SPRINT) MISC       nystatin (MYCOSTATIN) 100000 UNIT/ML suspension Take 5 mLs (500,000 Units total) by mouth 4 (four) times daily. 473 mL 1   ondansetron (ZOFRAN-ODT) 4 MG disintegrating tablet Take 1 tablet (4 mg total) by mouth every 8 (eight) hours as needed for nausea or vomiting. 30 tablet 1   predniSONE (DELTASONE) 10 MG tablet Take one tablet (10mg ) twice daily for six days days, then one tablet (10mg ) once daily for six days, then STOP. 18 tablet 0   Skin Protectants, Misc. (EUCERIN) cream Apply 1 Application topically daily as needed for dry skin.     tacrolimus (PROTOPIC) 0.03 % ointment Apply 1 Application topically 2 (two) times daily as needed (rash). (Patient taking differently: Apply 1 Application topically 2 (two) times daily as needed (recurrent rash).) 60 g 5   triamcinolone ointment (KENALOG) 0.1 % Apply 1 Application topically at bedtime. 30 g 5   Current Facility-Administered Medications on File Prior to Visit  Medication Dose Route Frequency Provider Last Rate Last Admin   benralizumab (FASENRA) prefilled syringe 30 mg  30 mg Subcutaneous Q8 Thomes Dinning, MD   30 mg at 01/21/24 1319        ROS:  All others reviewed and negative.  Objective        PE:  BP 128/76 (BP Location: Left Arm, Patient Position: Sitting, Cuff Size: Normal)   Pulse 80   Temp 98.3 F (36.8 C) (Oral)   Ht 5\' 2"  (1.575 m)   Wt 191 lb (86.6 kg)   SpO2 97%   BMI 34.93 kg/m                 Constitutional: Pt appears in NAD               HENT: Head: NCAT.                Right Ear: External ear normal.                 Left Ear: External ear normal.  Bilateral Tm's clear.  Eyes: . Pupils are equal, round, and reactive to light. Conjunctivae and EOM are normal               Nose: without d/c or deformity               Neck: Neck supple. Gross normal ROM               Cardiovascular: Normal rate and regular rhythm.                 Pulmonary/Chest: Effort normal and breath sounds without  rales or wheezing.                Abd:  Soft, NT, ND, + BS, no organomegaly               Neurological: Pt is alert. At baseline orientation, motor grossly intact               Skin: Skin is warm. No rashes, no other new lesions, LE edema - none               Psychiatric: Pt behavior is normal without agitation   Micro: none  Cardiac tracings I have personally interpreted today:  ECG - SR with PAC 76  Pertinent Radiological findings (summarize): none   Lab Results  Component Value Date   WBC 8.8 12/25/2023   HGB 10.0 (L) 12/25/2023   HCT 31.6 (L) 12/25/2023   PLT 401.0 (H) 12/25/2023   GLUCOSE 71 12/25/2023   CHOL 171 12/25/2023   TRIG 64.0 12/25/2023   HDL 77.50 12/25/2023   LDLCALC 80 12/25/2023   ALT 16 12/25/2023   AST 22 12/25/2023   NA 141 12/25/2023   K 3.5 12/25/2023   CL 104 12/25/2023   CREATININE 0.69 12/25/2023   BUN 11 12/25/2023   CO2 32 12/25/2023   TSH 0.53 12/25/2023   INR 0.9 03/14/2023   HGBA1C 6.0 12/25/2023   MICROALBUR 1.0 06/18/2023   Assessment/Plan:  [name] is a 63 y.o. Black or African American [2] female with  has a past medical history of Anemia, iron deficiency (12/22/2014), Arthritis, ASTHMA (05/12/2009), Bipolar disorder (HCC), COPD (chronic obstructive pulmonary disease) (HCC), Eczema (05/18/2022), Fibromyalgia (05/14/2014), GERD (gastroesophageal reflux disease), History of kidney stones, Hyperlipidemia (04/20/2017), HYPERTENSION (05/12/2009), Peripheral vascular disease (HCC), Pneumonia, Prediabetes (02/23/2014), Seizure (HCC), Stroke (HCC) (11/2020), and Urticaria.  Chest pain Atypical etiology unclear, declines further cxr, ECG reviewed, now for Stress Test, and has cardiology appt in may 2025 scheduled  Right ear pain Exam benign, suspect eustachian tube dysfxn, for mucinex bid prn  Laryngopharyngeal reflux (LPR) Mild to mod uncontrolled, for change protonix to omeprazole 40 mg every day, to f/u any worsening  symptoms or concerns  COPD/asthma Stable, cont current med tx inhaler  Followup: Return in about 4 months (around 07/06/2024).  Oliver Barre, MD 03/06/2024 9:11 PM [facility]

## 2024-03-06 NOTE — Patient Instructions (Addendum)
 Your EKG was done today  Ok to try change protonix to omeprazole 40 mg per day  You can take Mucinex to help keep the right ear draining and less ear pain  Please continue all other medications as before, and refills have been done if requested.  Please have the pharmacy call with any other refills you may need.  Please continue your efforts at being more active, low cholesterol diet, and weight control.  Please keep your appointments with your specialists as you may have planned - pulmonary  You will be contacted regarding the referral for: Stress test  Please make an Appointment to return in 4 months, or sooner if needed

## 2024-03-06 NOTE — Assessment & Plan Note (Signed)
 Exam benign, suspect eustachian tube dysfxn, for mucinex bid prn

## 2024-03-06 NOTE — Assessment & Plan Note (Addendum)
 Atypical etiology unclear, declines further cxr, ECG reviewed, now for Stress Test, and has cardiology appt in may 2025 scheduled

## 2024-03-06 NOTE — Assessment & Plan Note (Signed)
 Stable, cont current med tx inhaler

## 2024-03-09 NOTE — Telephone Encounter (Signed)
 Called and left a voicemail asking for a return call to inform. Gold vial has been mixed down.

## 2024-03-09 NOTE — Telephone Encounter (Signed)
 Patient called back and was informed. Patient verbalized understanding and will try to come in tomorrow to receive her injection.

## 2024-03-10 ENCOUNTER — Ambulatory Visit (INDEPENDENT_AMBULATORY_CARE_PROVIDER_SITE_OTHER): Admitting: Acute Care

## 2024-03-10 ENCOUNTER — Ambulatory Visit (INDEPENDENT_AMBULATORY_CARE_PROVIDER_SITE_OTHER): Payer: Self-pay | Admitting: *Deleted

## 2024-03-10 DIAGNOSIS — Z87891 Personal history of nicotine dependence: Secondary | ICD-10-CM | POA: Diagnosis not present

## 2024-03-10 DIAGNOSIS — J309 Allergic rhinitis, unspecified: Secondary | ICD-10-CM

## 2024-03-10 NOTE — Progress Notes (Signed)
 Provider Attestation I agree with the documentation of the Shared Decision Making visit,  smoking cessation counseling if appropriate, and verification or eligibility for lung cancer screening as documented by the RN Nurse Navigator.   Raejean Bullock, MSN, AGACNP-BC [facility] See Amion for personal pager PCCM on call pager [phone]   Virtual Visit via Video Note  I connected with [name] on 03/10/24 at  9:30 AM EDT by a video enabled telemedicine application and verified that I am speaking with the correct person using two identifiers.  Location: Patient: in home Provider: [address] [place], [place], [address]    Shared Decision Making Visit Lung Cancer Screening Program [phone])   Eligibility: Age 63 y.o. Pack Years Smoking History Calculation 24 (# packs/per year x # years smoked) Recent History of coughing up blood  no Unexplained weight loss? no ( >Than 15 pounds within the last 6 months ) Prior History Lung / other cancer no (Diagnosis within the last 5 years already requiring surveillance chest CT Scans). Smoking Status Former Smoker Former Smokers: Years since quit: 5 years  Quit Date: 04-2018  Visit Components: Discussion included one or more decision making aids. yes Discussion included risk/benefits of screening. yes Discussion included potential follow up diagnostic testing for abnormal scans. yes Discussion included meaning and risk of over diagnosis. yes Discussion included meaning and risk of False Positives. yes Discussion included meaning of total radiation exposure. yes  Counseling Included: Importance of adherence to annual lung cancer LDCT screening. yes Impact of comorbidities on ability to participate in the program. yes Ability and willingness to under diagnostic treatment. yes  Smoking Cessation Counseling: Current Smokers:  Discussed importance of smoking cessation.  yes Information about tobacco cessation classes and interventions provided to patient. yes Patient provided with "ticket" for LDCT Scan. yes Symptomatic Patient. no  Counseling NA Diagnosis Code: Tobacco Use Z72.0 Asymptomatic Patient yes  Counseling (Intermediate counseling: > three minutes counseling) M5784 Former Smokers:  Discussed the importance of maintaining cigarette abstinence. yes Diagnosis Code: Personal History of Nicotine  Dependence. O96.295 Information about tobacco cessation classes and interventions provided to patient. Yes Patient provided with "ticket" for LDCT Scan. yes Written Order for Lung Cancer Screening with LDCT placed in Epic. Yes (CT Chest Lung Cancer Screening Low Dose W/O CM) [id number] Z12.2-Screening of respiratory organs Z87.891-Personal history of nicotine  dependence   Valentin Gaskins, RN 03/10/24

## 2024-03-10 NOTE — Patient Instructions (Signed)

## 2024-03-11 ENCOUNTER — Ambulatory Visit
Admission: RE | Admit: 2024-03-11 | Discharge: 2024-03-11 | Disposition: A | Discharge status: Home / Self Care | Source: Ambulatory Visit | Attending: Acute Care | Admitting: Acute Care

## 2024-03-11 DIAGNOSIS — Z87891 Personal history of nicotine dependence: Secondary | ICD-10-CM | POA: Diagnosis not present

## 2024-03-11 DIAGNOSIS — Z122 Encounter for screening for malignant neoplasm of respiratory organs: Secondary | ICD-10-CM | POA: Diagnosis not present

## 2024-03-12 ENCOUNTER — Telehealth: Payer: Self-pay | Admitting: Nurse Practitioner

## 2024-03-12 ENCOUNTER — Other Ambulatory Visit: Payer: Self-pay | Admitting: Internal Medicine

## 2024-03-12 ENCOUNTER — Other Ambulatory Visit: Payer: Self-pay

## 2024-03-12 ENCOUNTER — Ambulatory Visit: Payer: Self-pay

## 2024-03-12 NOTE — Patient Outreach (Signed)
 Care Coordination   Follow Up Visit Note   03/12/2024 Name: [name] MRN: [id number] DOB: [birth year]  [name] is a 63 y.o. year old female who sees Felicia Levins, MD for primary care. I spoke with  [name] by phone today.  What matters to the patients health and wellness today?  [name] reports "I am doing ok". She denies any breathing difficulty today. She continues to attend provider visits as scheduled and received allergy injections as recommended. PCP visit completed on 03/06/24, pulmonology lung cancer screening video visit completed 03/10/24. [name] denies any questions or concerns at this time. No care management needs identified at this time.   Goals Addressed             This Visit's Progress    COMPLETED: Maintain and/or improve health       Interventions Today    Flowsheet Row Most Recent Value  Chronic Disease   Chronic disease during today's visit Chronic Obstructive Pulmonary Disease (COPD), Other, Hypertension (HTN)  [asthma]  General Interventions   General Interventions Discussed/Reviewed General Interventions Reviewed, Doctor Visits  [Evaluation of current treatment plan for health condition and patient's adherence to plan.]  Doctor Visits Discussed/Reviewed PCP, Specialist  PCP/Specialist Visits Compliance with follow-up visit  [reviewed upcoming/scheduled visits]  Education Interventions   Education Provided Provided Education  Provided Verbal Education On Medication, When to see the doctor, Other  [advised to contact provider with health questions or concerns, reiterated action plan(COPD) and encouraged to know what is normal and contact provider if questions or concerns, medications not working.advised to take medications as prescribed.]  Pharmacy Interventions   Pharmacy Dicussed/Reviewed Pharmacy Topics Reviewed  [medications reviewed]            SDOH assessments and interventions  completed:  No  Care Coordination Interventions:  Yes, provided   Follow up plan: No further intervention required.   Encounter Outcome:  Patient Visit Completed   Kathyrn Sheriff, RN, MSN, BSN, CCM [place]  [facility], Population Health Case Manager Phone: [phone]

## 2024-03-12 NOTE — Patient Instructions (Signed)
 Visit Information  Thank you for taking time to visit with me today. Please don't hesitate to contact me if I can be of assistance to you.   Following are the goals we discussed today:  Continue to take medications as prescribed. Continue to attend provider visits as scheduled Continue to eat healthy, lean meats, vegetables, fruits, avoid saturated and transfats Contact provider with health questions or concerns as needed Continue to check blood pressure routinely and contact provider if questions or concerns  If you are experiencing a Mental Health or Behavioral Health Crisis or need someone to talk to, please call the Suicide and Crisis Lifeline: 988 call the [place] National Suicide Prevention Lifeline: (862)859-7393 or TTY: 830-394-8714 TTY (564)556-6958) to talk to a trained counselor  Kathyrn Sheriff, RN, MSN, BSN, CCM [place]  [facility], Population Health Case Manager Phone: [phone]

## 2024-03-12 NOTE — Progress Notes (Unsigned)
 [facility] Return Visit  SUBJECTIVE  History of Present Illness: [name] is a 63 y.o. female seen in follow-up for constipation, dyspareunia, urgency urinary incontinence, and nocturia. Plan at last visit was trial of Gemtesa, continue CPAP use, switch lasix to 2pm, compression socks, vaginal estrogen, and fiber supplementation.   Reports feeling pleased with resolution of urinary symptoms. 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 Marjory Lies T2DM with last HbA1C 5.9 on 07/2023, repeat HbA1C 6 on 12/25/23  03/21/23 "CLINICAL DATA:  Abnormal CT demonstrating a cirrhotic appearing liver   EXAM: ULTRASOUND ABDOMEN   ULTRASOUND HEPATIC ELASTOGRAPHY   TECHNIQUE: Sonography of the upper abdomen was performed. In addition, ultrasound elastography evaluation of the liver was performed. A region of interest was placed within the right lobe of the liver. Following application of a compressive sonographic pulse, tissue compressibility was assessed. Multiple assessments were performed at the selected site. Median tissue compressibility was determined. Previously, hepatic stiffness was assessed by shear wave velocity. Based on recently published Society of Radiologists in Ultrasound consensus article, reporting is now recommended to be performed in the SI units of pressure (kiloPascals) representing hepatic stiffness/elasticity. The obtained result is compared to the published reference standards. (cACLD = compensated Advanced Chronic Liver Disease)   COMPARISON:  CT chest 02/22/2023   FINDINGS: ULTRASOUND ABDOMEN    Gallbladder: Normally distended without stones or wall thickening. No pericholecystic fluid or sonographic Murphy sign.   Common bile duct: Diameter: Inadequately visualized due to poor sound penetration   Liver: Increased hepatic echogenicity with slightly nodular hepatic margins suggesting cirrhosis. No discrete hepatic mass. No intrahepatic biliary dilatation. Portal vein is patent on color Doppler imaging with normal direction of blood flow towards the liver.   IVC: Normal appearance   Pancreas: Normal appearance   Spleen: Normal appearance, 9.3 cm length   Right Kidney: Length: 10.8 cm. Normal morphology without mass or hydronephrosis.   Left Kidney: Length: 10.1 cm. Normal morphology without mass or hydronephrosis.   Abdominal aorta: Normal caliber   Other findings: No free fluid   ULTRASOUND HEPATIC ELASTOGRAPHY   Device: Siemens Helix VTQ   Patient position: Oblique   Transducer 9C2   Number of measurements: 12   Hepatic segment:  8   Median kPa: 6.7   IQR: 2.8   IQR/Median kPa ratio: 0.4   Data quality:  Good   Diagnostic category: < or = 9 kPa: in the absence of other known clinical signs, rules out cACLD   The use of hepatic elastography is applicable to patients with viral hepatitis and non-alcoholic fatty liver disease. At this time, there is insufficient data for the referenced cut-off values and use in other causes of liver disease, including alcoholic liver disease. Patients, however, may be assessed by elastography and serve as their own reference standard/baseline.   In patients with non-alcoholic liver disease, the values suggesting compensated advanced chronic liver disease (cACLD) may be lower, and patients may need additional testing with elasticity results of 7-9 kPa.   Please note that abnormal hepatic elasticity and shear wave velocities may also be identified in clinical settings other than with hepatic fibrosis, such as: acute hepatitis,  elevated right  heart and central venous pressures including use of beta blockers, veno-occlusive disease (Budd-Chiari), infiltrative processes such as mastocytosis/amyloidosis/infiltrative tumor/lymphoma, extrahepatic cholestasis, with hyperemia in the post-prandial state, and with liver transplantation. Correlation with patient history, laboratory data, and clinical condition recommended.   Diagnostic Categories:   < or =5 kPa: high probability of being normal   < or =9 kPa: in the absence of other known clinical signs, rules out cACLD   >9 kPa and ?13 kPa: suggestive of cACLD, but needs further testing   >13 kPa: highly suggestive of cACLD   > or =17 kPa: highly suggestive of cACLD with an increased probability of clinically significant portal hypertension   IMPRESSION: ULTRASOUND ABDOMEN:   Increased hepatic echogenicity with slightly nodular hepatic margins suggesting cirrhosis.   Remaining ultrasound abdomen unremarkable.   ULTRASOUND HEPATIC ELASTOGRAPHY:   Median kPa:  6.7   Diagnostic category: < or = 9 kPa: in the absence of other known clinical signs, rules out cACLD. However in light of morphologic changes demonstrated sonographically and by CT, consider further testing.     Electronically Signed   By: Ulyses Southward M.D.   On: 03/21/2023 10:12"  Lab Results  Component Value Date   CREATININE 0.69 12/25/2023      Past Medical History: Patient  has a past medical history of Anemia, iron deficiency (12/22/2014), Arthritis, ASTHMA (05/12/2009), Bipolar disorder (HCC), COPD (chronic obstructive pulmonary disease) (HCC), Eczema (05/18/2022), Fibromyalgia (05/14/2014), GERD (gastroesophageal reflux disease), History of kidney stones, Hyperlipidemia (04/20/2017), HYPERTENSION (05/12/2009), Peripheral vascular disease (HCC), Pneumonia, Prediabetes (02/23/2014), Seizure (HCC), Stroke (HCC) (11/2020), and Urticaria.   Past Surgical History: She  has a past surgical history  that includes Neck surgery; Toe Surgery; Abdominal hysterectomy; Tubal ligation; Kidney stone surgery; Hemorrhoid surgery; Incise and drain abcess; Back surgery; Diagnostic laparoscopy; Colonoscopy (12/20/2011); Colonoscopy (03/05/2012); Spine surgery (2019); Anterior cervical decomp/discectomy fusion (N/A, 07/28/2020); and Esophagogastroduodenoscopy (egd) with propofol (N/A, 10/31/2023).   Medications: She has a current medication list which includes the following prescription(s): airsupra, albuterol, albuterol, aripiprazole, atorvastatin, azelastine, fasenra pen, breztri aerosphere, budesonide, carbamazepine, cetirizine, cromolyn, doxepin, ohtuvayre, famotidine, ferrous sulfate, fluticasone, formoterol, furosemide, hydroxyzine, atrovent hfa, atrovent hfa, irbesartan, levocetirizine, montelukast, pari proneb max lc sprint, nystatin, omeprazole, ondansetron, prednisone, eucerin, tacrolimus, triamcinolone ointment, estradiol, and gemtesa, and the following Facility-Administered Medications: benralizumab.   Allergies: Patient is allergic to sulfa antibiotics and venofer [iron sucrose].   Social History: Patient  reports that she quit smoking about 5 years ago. Her smoking use included cigarettes. She started smoking about 46 years ago. She has a 20 pack-year smoking history. She has never been exposed to tobacco smoke. She has never used smokeless tobacco. She reports that she does not currently use alcohol. She reports that she does not currently use drugs after having used the following drugs: Marijuana.     OBJECTIVE     Physical Exam: Vitals:   03/13/24 1119  BP: 132/87  Pulse: 81   Physical Exam Constitutional:      General: She is not in acute distress.    Appearance: Normal appearance.  Genitourinary:     Urethral meatus normal.     No lesions in the vagina.     Right Labia: No rash, tenderness, lesions, skin changes or Bartholin's cyst.    Left Labia: No tenderness, lesions, skin  changes, Bartholin's cyst or rash.    No vaginal discharge, erythema, tenderness, bleeding, ulceration or granulation tissue.     No vaginal prolapse present. Cardiovascular:  Rate and Rhythm: Normal rate.  Pulmonary:     Effort: Pulmonary effort is normal. No respiratory distress.  Neurological:     Mental Status: She is alert.  Vitals reviewed. Exam conducted with a chaperone present.     Lab Results  Component Value Date   COLORU Yellow 03/13/2024   CLARITYU Clear 03/13/2024   GLUCOSEUR Negative 03/13/2024   BILIRUBINUR Negative 03/13/2024   KETONESU Negative 03/13/2024   SPECGRAV 1.010 03/13/2024   RBCUR Negative 03/13/2024   PHUR 6.0 03/13/2024   PROTEINUR Negative 03/13/2024   UROBILINOGEN 0.2 03/13/2024   LEUKOCYTESUR Negative 03/13/2024        No data to display             ASSESSMENT AND PLAN    Ms. Bilotta is a 63 y.o. with:  1. Constipation, unspecified constipation type   2. Dyspareunia in female   3. Urge urinary incontinence   4. Nocturia   5. Vaginal irritation     Constipation, unspecified constipation type Assessment & Plan: - reports improvement of bowel frequency - For constipation, we reviewed the importance of a better bowel regimen.  We also discussed the importance of avoiding chronic straining, as it can exacerbate her pelvic floor symptoms; we discussed treating constipation and straining prior to surgery, as postoperative straining can lead to damage to the repair and recurrence of symptoms. We discussed initiating therapy with increasing fluid intake, fiber supplementation, stool softeners, and laxatives such as miralax.  - encouraged to continue squatting position for bowel movement and minimize straining - encouraged fiber supplementation and miralax use, discontinued stool softener - can be exacerbated by iron supplementation due to history of anemia   Dyspareunia in female Assessment & Plan: - left sided pelvic floor  myofascial pain and right sided vulvar pain with palpation, increased pelvic floor muscle tone with inadequate relaxation - encouraged to continue pelvic floor PT exercises with emphasis on relaxation - optimize stool consistency to decrease straining - encouraged position change and lubrication use - continue low dose vaginal estrogen for vaginal atrophy, increase dosing from 0.5g twice a week to 1g/2x/week   Orders: -     Estradiol; Place 1g nightly 2-3 times a week  Dispense: 30 g; Refill: 3  Urge urinary incontinence Assessment & Plan: - We discussed the symptoms of overactive bladder (OAB), which include urinary urgency, urinary frequency, nocturia, with or without urge incontinence.  While we do not know the exact etiology of OAB, several treatment options exist. We discussed management including behavioral therapy (decreasing bladder irritants, urge suppression strategies, timed voids, bladder retraining), physical therapy, medication; for refractory cases posterior tibial nerve stimulation, sacral neuromodulation, and intravesical botulinum toxin injection.  For anticholinergic medications, we discussed the potential side effects of anticholinergics including dry eyes, dry mouth, constipation, cognitive impairment and urinary retention. For Beta-3 agonist medication, we discussed the potential side effect of elevated blood pressure which is more likely to occur in individuals with uncontrolled hypertension. - continue pelvic floor PT exercises - continue Gemtesa due to resolution of urinary symptoms - prior bladder scan PVR 9mL WNL - negative UA p - consider urodynamics if refractory symptoms due to history of CVA on imaging, currently low risk per neurogenic LUT dysfunction algorithm. Discussed need for workup if clinical change with development of new symptoms - exacerbation of urinary symptoms with respiratory symptoms, reassess after respiratory symptoms improves since prior  improvement after discontinuation of steroids  Orders: Leslye Peer; Take 1  tablet (75 mg total) by mouth daily.  Dispense: 30 tablet; Refill: 11 -     Estradiol; Place 1g nightly 2-3 times a week  Dispense: 30 g; Refill: 3  Nocturia Assessment & Plan: - resolved with Gemtesa - continue glycemic control - encouraged compression socks for LE edema - avoids fluid intake after dinner - continue CPAP use for OSA - switch lasix dosing to 2pm  Orders: -     POCT urinalysis dipstick  Vaginal irritation Assessment & Plan: - For symptomatic vaginal atrophy options include lubrication with a water-based lubricant, personal hygiene measures and barrier protection against wetness, and estrogen replacement in the form of vaginal cream, vaginal tablets, or a time-released vaginal ring.   - Rx refill sent for low dose vaginal estrogen, increase to 1g 2-3x/week due to persistent irritation - no skin changes to suggest dermatoses - Nuswab pending to r/o infectious etiology  Orders: -     Estradiol; Place 1g nightly 2-3 times a week  Dispense: 30 g; Refill: 3 -     Cervicovaginal ancillary only  Time spent: I spent 25 minutes dedicated to the care of this patient on the date of this encounter to include pre-visit review of records, face-to-face time with the patient discussing nocturia, urgency urinary incontinence, dyspareunia, constipation, and post visit documentation and ordering medication/ testing.    Loleta Chance, MD

## 2024-03-13 ENCOUNTER — Other Ambulatory Visit (HOSPITAL_COMMUNITY)
Admission: RE | Admit: 2024-03-13 | Discharge: 2024-03-13 | Disposition: A | Discharge status: Home / Self Care | Source: Ambulatory Visit | Attending: Obstetrics | Admitting: Obstetrics

## 2024-03-13 ENCOUNTER — Ambulatory Visit: Admitting: Obstetrics

## 2024-03-13 ENCOUNTER — Encounter: Payer: Self-pay | Admitting: Obstetrics

## 2024-03-13 VITALS — BP 132/87 | HR 81

## 2024-03-13 DIAGNOSIS — N3941 Urge incontinence: Secondary | ICD-10-CM | POA: Diagnosis not present

## 2024-03-13 DIAGNOSIS — N898 Other specified noninflammatory disorders of vagina: Secondary | ICD-10-CM

## 2024-03-13 DIAGNOSIS — K5900 Constipation, unspecified: Secondary | ICD-10-CM | POA: Diagnosis not present

## 2024-03-13 DIAGNOSIS — R351 Nocturia: Secondary | ICD-10-CM | POA: Diagnosis not present

## 2024-03-13 DIAGNOSIS — N941 Unspecified dyspareunia: Secondary | ICD-10-CM | POA: Diagnosis not present

## 2024-03-13 LAB — POCT URINALYSIS DIPSTICK
Bilirubin, UA: NEGATIVE
Blood, UA: NEGATIVE
Glucose, UA: NEGATIVE
Ketones, UA: NEGATIVE
Leukocytes, UA: NEGATIVE
Nitrite, UA: NEGATIVE
Protein, UA: NEGATIVE
Spec Grav, UA: 1.01 (ref 1.010–1.025)
Urobilinogen, UA: 0.2 U/dL
pH, UA: 6 (ref 5.0–8.0)

## 2024-03-13 MED ORDER — ESTRADIOL 0.1 MG/GM VA CREA: TOPICAL_CREAM | VAGINAL | 3 refills | Status: DC

## 2024-03-13 MED ORDER — GEMTESA 75 MG PO TABS: 1.0000 | ORAL_TABLET | Freq: Every day | ORAL | 11 refills | Status: AC

## 2024-03-13 NOTE — Assessment & Plan Note (Addendum)
-   For symptomatic vaginal atrophy options include lubrication with a water-based lubricant, personal hygiene measures and barrier protection against wetness, and estrogen replacement in the form of vaginal cream, vaginal tablets, or a time-released vaginal ring.   - Rx refill sent for low dose vaginal estrogen, increase to 1g 2-3x/week due to persistent irritation - no skin changes to suggest dermatoses - Nuswab pending to r/o infectious etiology

## 2024-03-13 NOTE — Assessment & Plan Note (Addendum)
-   reports improvement of bowel frequency - For constipation, we reviewed the importance of a better bowel regimen.  We also discussed the importance of avoiding chronic straining, as it can exacerbate her pelvic floor symptoms; we discussed treating constipation and straining prior to surgery, as postoperative straining can lead to damage to the repair and recurrence of symptoms. We discussed initiating therapy with increasing fluid intake, fiber supplementation, stool softeners, and laxatives such as miralax.  - encouraged to continue squatting position for bowel movement and minimize straining - encouraged fiber supplementation and miralax use, discontinued stool softener - can be exacerbated by iron supplementation due to history of anemia

## 2024-03-13 NOTE — Assessment & Plan Note (Addendum)
-   resolved with Gemtesa - continue glycemic control - encouraged compression socks for LE edema - avoids fluid intake after dinner - continue CPAP use for OSA - switch lasix dosing to 2pm

## 2024-03-13 NOTE — Assessment & Plan Note (Addendum)
-   left sided pelvic floor myofascial pain and right sided vulvar pain with palpation, increased pelvic floor muscle tone with inadequate relaxation - encouraged to continue pelvic floor PT exercises with emphasis on relaxation - optimize stool consistency to decrease straining - encouraged position change and lubrication use - continue low dose vaginal estrogen for vaginal atrophy, increase dosing from 0.5g twice a week to 1g/2x/week

## 2024-03-13 NOTE — Patient Instructions (Signed)
 Continue Gemtesa once daily.   Continue vaginal estrogen, increase to 1 g 3 times a week for vaginal irritation and dryness.   We will call you if your testing is positive.   Please call if you experience persistent or worsening urinary symptoms, or UTI symptoms such as fever > 100.4, nausea/vomiting, one sided back pain or blood in your urine.

## 2024-03-13 NOTE — Assessment & Plan Note (Addendum)
-   We discussed the symptoms of overactive bladder (OAB), which include urinary urgency, urinary frequency, nocturia, with or without urge incontinence.  While we do not know the exact etiology of OAB, several treatment options exist. We discussed management including behavioral therapy (decreasing bladder irritants, urge suppression strategies, timed voids, bladder retraining), physical therapy, medication; for refractory cases posterior tibial nerve stimulation, sacral neuromodulation, and intravesical botulinum toxin injection.  For anticholinergic medications, we discussed the potential side effects of anticholinergics including dry eyes, dry mouth, constipation, cognitive impairment and urinary retention. For Beta-3 agonist medication, we discussed the potential side effect of elevated blood pressure which is more likely to occur in individuals with uncontrolled hypertension. - continue pelvic floor PT exercises - continue Gemtesa due to resolution of urinary symptoms - prior bladder scan PVR 9mL WNL - negative UA p - consider urodynamics if refractory symptoms due to history of CVA on imaging, currently low risk per neurogenic LUT dysfunction algorithm. Discussed need for workup if clinical change with development of new symptoms - exacerbation of urinary symptoms with respiratory symptoms, reassess after respiratory symptoms improves since prior improvement after discontinuation of steroids

## 2024-03-16 ENCOUNTER — Encounter: Payer: Self-pay | Admitting: Obstetrics

## 2024-03-16 ENCOUNTER — Telehealth: Payer: Self-pay | Admitting: Allergy & Immunology

## 2024-03-16 DIAGNOSIS — J387 Other diseases of larynx: Secondary | ICD-10-CM

## 2024-03-16 LAB — CERVICOVAGINAL ANCILLARY ONLY
Bacterial Vaginitis (gardnerella): NEGATIVE
Candida Glabrata: NEGATIVE
Candida Vaginitis: NEGATIVE
Comment: NEGATIVE
Comment: NEGATIVE
Comment: NEGATIVE

## 2024-03-16 NOTE — Telephone Encounter (Signed)
Referral for ENT placed.

## 2024-03-17 ENCOUNTER — Ambulatory Visit: Payer: Medicare HMO | Admitting: *Deleted

## 2024-03-17 DIAGNOSIS — J309 Allergic rhinitis, unspecified: Secondary | ICD-10-CM

## 2024-03-17 DIAGNOSIS — J4550 Severe persistent asthma, uncomplicated: Secondary | ICD-10-CM | POA: Diagnosis not present

## 2024-03-20 ENCOUNTER — Other Ambulatory Visit: Payer: Self-pay | Admitting: Internal Medicine

## 2024-03-20 MED ORDER — AIRSUPRA 90-80 MCG/ACT IN AERO: 2.0000 | INHALATION_SPRAY | Freq: Four times a day (QID) | RESPIRATORY_TRACT | 0 refills | Status: DC | PRN

## 2024-03-24 ENCOUNTER — Ambulatory Visit (INDEPENDENT_AMBULATORY_CARE_PROVIDER_SITE_OTHER): Payer: Self-pay

## 2024-03-24 DIAGNOSIS — J309 Allergic rhinitis, unspecified: Secondary | ICD-10-CM

## 2024-03-24 DIAGNOSIS — J3089 Other allergic rhinitis: Secondary | ICD-10-CM

## 2024-03-25 ENCOUNTER — Other Ambulatory Visit: Payer: Self-pay

## 2024-03-25 ENCOUNTER — Ambulatory Visit (INDEPENDENT_AMBULATORY_CARE_PROVIDER_SITE_OTHER): Admitting: Internal Medicine

## 2024-03-25 ENCOUNTER — Encounter: Payer: Self-pay | Admitting: Internal Medicine

## 2024-03-25 ENCOUNTER — Encounter: Payer: Self-pay | Admitting: Allergy

## 2024-03-25 ENCOUNTER — Ambulatory Visit (INDEPENDENT_AMBULATORY_CARE_PROVIDER_SITE_OTHER): Admitting: Allergy

## 2024-03-25 VITALS — BP 142/90 | HR 83 | Temp 98.9°F | Ht 62.0 in | Wt 192.0 lb

## 2024-03-25 VITALS — BP 160/102 | HR 83 | Temp 98.0°F | Resp 18

## 2024-03-25 DIAGNOSIS — J441 Chronic obstructive pulmonary disease with (acute) exacerbation: Secondary | ICD-10-CM | POA: Diagnosis not present

## 2024-03-25 DIAGNOSIS — J4489 Other specified chronic obstructive pulmonary disease: Secondary | ICD-10-CM

## 2024-03-25 DIAGNOSIS — E559 Vitamin D deficiency, unspecified: Secondary | ICD-10-CM | POA: Diagnosis not present

## 2024-03-25 DIAGNOSIS — L508 Other urticaria: Secondary | ICD-10-CM

## 2024-03-25 DIAGNOSIS — I10 Essential (primary) hypertension: Secondary | ICD-10-CM

## 2024-03-25 DIAGNOSIS — J3089 Other allergic rhinitis: Secondary | ICD-10-CM | POA: Diagnosis not present

## 2024-03-25 DIAGNOSIS — R7303 Prediabetes: Secondary | ICD-10-CM

## 2024-03-25 DIAGNOSIS — R030 Elevated blood-pressure reading, without diagnosis of hypertension: Secondary | ICD-10-CM

## 2024-03-25 DIAGNOSIS — F32A Depression, unspecified: Secondary | ICD-10-CM

## 2024-03-25 DIAGNOSIS — R079 Chest pain, unspecified: Secondary | ICD-10-CM

## 2024-03-25 MED ORDER — PREDNISONE 10 MG PO TABS: ORAL_TABLET | ORAL | 0 refills | Status: DC

## 2024-03-25 MED ORDER — AIRSUPRA 90-80 MCG/ACT IN AERO: 2.0000 | INHALATION_SPRAY | Freq: Four times a day (QID) | RESPIRATORY_TRACT | 3 refills | Status: DC | PRN

## 2024-03-25 MED ORDER — IPRATROPIUM-ALBUTEROL 0.5-2.5 (3) MG/3ML IN SOLN
3.0000 mL | Freq: Once | RESPIRATORY_TRACT | Status: AC
  Administered 2024-03-25: 3 mL via RESPIRATORY_TRACT

## 2024-03-25 MED ORDER — IRBESARTAN 150 MG PO TABS
150.0000 mg | ORAL_TABLET | Freq: Every day | ORAL | 3 refills | Status: AC
Start: 2024-03-25 — End: ?

## 2024-03-25 MED ORDER — BUDESONIDE 0.5 MG/2ML IN SUSP: 0.5000 mg | Freq: Two times a day (BID) | RESPIRATORY_TRACT | 2 refills | Status: DC

## 2024-03-25 MED ORDER — METHYLPREDNISOLONE ACETATE 80 MG/ML IJ SUSP
80.0000 mg | Freq: Once | INTRAMUSCULAR | Status: AC
  Administered 2024-03-25: 80 mg via INTRAMUSCULAR

## 2024-03-25 NOTE — Progress Notes (Signed)
 Patient ID: [name], female   DOB: March 29, 1961, 63 y.o.   MRN: [id number]        Chief Complaint: follow up htn, asthma copd, low vit d, depression       HPI:  [name] is a 63 y.o. female here after had some difficulty with breathing at 3 am recently with elevated SBP 200, seen per pulm with start irbesartan 75 mg per day, but not taking after ran out of refills.  Pt denies chest pain, increased sob or doe, wheezing, orthopnea, PND, increased LE swelling, palpitations, dizziness or syncope.   Pt denies polydipsia, polyuria, or new focal neuro s/s.    Pt denies fever, wt loss, night sweats, loss of appetite, or other constitutional symptoms  Denies worsening depressive symptoms, suicidal ideation, or panic       Wt Readings from Last 3 Encounters:  03/25/24 192 lb (87.1 kg)  03/06/24 191 lb (86.6 kg)  02/04/24 189 lb 4 oz (85.8 kg)   BP Readings from Last 3 Encounters:  03/25/24 (!) 142/90  03/25/24 (!) 160/102  03/13/24 132/87         Past Medical History:  Diagnosis Date   Anemia, iron deficiency 12/22/2014   pt. 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      Bipolar disorder (HCC)    with anxiety, depression   COPD (chronic obstructive pulmonary disease) (HCC)    Eczema 05/18/2022   Fibromyalgia 05/14/2014   GERD (gastroesophageal reflux disease)    History of kidney stones    Hyperlipidemia 04/20/2017   HYPERTENSION 05/12/2009   Qualifier: Diagnosis of  By: Jayson Michael Nonie Beady), [name]     Peripheral vascular disease (HCC)    Pneumonia    Prediabetes 02/23/2014   pt. denies   Seizure (HCC)    Stroke (HCC) 11/2020   Urticaria    Past Surgical History:  Procedure Laterality Date   ABDOMINAL HYSTERECTOMY     ANTERIOR CERVICAL DECOMP/DISCECTOMY FUSION  N/A 07/28/2020   Procedure: ANTERIOR CERVICAL DECOMPRESSION/DISCECTOMY FUSION. INTERBODY PROTHESIS, PLATE/SCREWS CERVICAL THREE- CERVICAL FOUR, CERVICAL FOUR- CERVICAL FIVE;  Surgeon: Garry Kansas, MD;  Location: [facility] OR;  Service: Neurosurgery;  Laterality: N/A;   BACK SURGERY     COLONOSCOPY  12/20/2011   Procedure: COLONOSCOPY;  Surgeon: Yves Herb, MD;  Location: [facility] ENDOSCOPY;  Service: Endoscopy;  Laterality: N/A;   COLONOSCOPY  03/05/2012   Procedure: COLONOSCOPY;  Surgeon: Yves Herb, MD;  Location: [facility] ENDOSCOPY;  Service: Endoscopy;  Laterality: N/A;   DIAGNOSTIC LAPAROSCOPY     ESOPHAGOGASTRODUODENOSCOPY (EGD) WITH PROPOFOL N/A 10/31/2023   Procedure: ESOPHAGOGASTRODUODENOSCOPY (EGD) WITH PROPOFOL;  Surgeon: Albertina Hugger, MD;  Location: [facility] ENDOSCOPY;  Service: Gastroenterology;  Laterality: N/A;   HEMORRHOID SURGERY     INCISE AND DRAIN ABCESS     KIDNEY STONE SURGERY     NECK SURGERY     x 2 Dr Arvie Latus   SPINE SURGERY  2019   TOE SURGERY     TUBAL LIGATION      reports that she quit smoking about 5 years ago. Her smoking use included cigarettes. She started smoking about 46 years ago. She has a 20 pack-year smoking history. She has never been exposed to tobacco smoke. She has never used smokeless tobacco. She  reports that she does not currently use alcohol. She reports that she does not currently use drugs after having used the following drugs: Marijuana. family history includes Alcohol abuse in her brother and father; Anemia in her daughter, sister, and sister; Anesthesia problems in her father; Asthma in her brother, mother, and sister; Brain cancer in her sister; Breast cancer (age of onset: 75) in her sister; COPD in her mother and sister; Colon cancer (age of onset: 36) in her father; Diabetes in her brother, father, mother, sister, sister, sister, and sister; Heart disease in her brother, brother, mother, and sister; Hypertension in her brother, daughter,  sister, sister, sister, sister, and sister; Kidney disease in her father; Sleep apnea in her brother. Allergies  Allergen Reactions   Sulfa Antibiotics Nausea And Vomiting and Other (See Comments)   Venofer [Iron Sucrose] Hives   Current Outpatient Medications on File Prior to Visit  Medication Sig Dispense Refill   albuterol (VENTOLIN HFA) 108 (90 Base) MCG/ACT inhaler INHALE 2 PUFFS INTO THE LUNGS EVERY 6 HOURS AS NEEDED FOR WHEEZING OR SHORTNESS OF BREATH 18 g 1   ARIPiprazole (ABILIFY) 10 MG tablet Take 1 tablet (10 mg total) by mouth daily. Take Abilify 20 mg po daily for 2 weeks till 9/17 , then continue taking 10 mg po daily 30 tablet 1   atorvastatin (LIPITOR) 40 MG tablet Take 1 tablet (40 mg total) by mouth daily. TAKE 1 TABLET(40 MG) BY MOUTH DAILY Strength: 40 mg 90 tablet 3   azelastine (OPTIVAR) 0.05 % ophthalmic solution Place 1 drop into both eyes 2 (two) times daily. 6 mL 12   benralizumab (FASENRA PEN) 30 MG/ML prefilled autoinjector Inject 1 mL (30 mg total) into the skin every 28 (twenty-eight) days. For 3 doses then every 8 weeks 1 mL 9   Budeson-Glycopyrrol-Formoterol (BREZTRI AEROSPHERE) 160-9-4.8 MCG/ACT AERO Inhale 2 puffs into the lungs in the morning and at bedtime. 10.7 g 11   budesonide (PULMICORT) 0.5 MG/2ML nebulizer solution Take 2 mLs (0.5 mg total) by nebulization in the morning and at bedtime. 120 mL 2   carbamazepine (TEGRETOL) 200 MG tablet Take 2-3 tablets (400-600 mg total) by mouth See admin instructions. Take 400mg  in the morning and 600mg  at bedtime. 150 tablet 2   cetirizine (ZYRTEC) 10 MG tablet Can take one tablet by mouth once daily if needed for runny nose. 30 tablet 5   cromolyn (OPTICROM) 4 % ophthalmic solution Place 1 drop into both eyes 4 (four) times daily. 10 mL 12   doxepin (SINEQUAN) 25 MG capsule Take 1 capsule (25 mg total) by mouth at bedtime as needed. 30 capsule 5   Ensifentrine (OHTUVAYRE) 3 MG/2.5ML SUSP Inhale 3 mg into the lungs in  the morning and at bedtime. 150 mL 11   estradiol (ESTRACE) 0.1 MG/GM vaginal cream Place 1g nightly 2-3 times a week 30 g 3   famotidine (PEPCID) 40 MG tablet Take 1 tablet (40 mg total) by mouth 2 (two) times daily. 60 tablet 5   ferrous sulfate 325 (65 FE) MG EC tablet TAKE 1 TABLET(325 MG) BY MOUTH DAILY WITH BREAKFAST 90 tablet 1   fluticasone (FLONASE) 50 MCG/ACT nasal spray SHAKE LIQUID AND USE 1 SPRAY IN EACH NOSTRIL TWICE DAILY AS NEEDED FOR ALLERGIES OR RHINITIS 16 g 5   formoterol (PERFOROMIST) 20 MCG/2ML nebulizer solution Take 20 mcg by nebulization 2 (two) times daily.     furosemide (LASIX) 20 MG tablet TAKE 1 TABLET BY MOUTH DAILY AS  NEEDED 30 tablet 5   hydrOXYzine (VISTARIL) 25 MG capsule Take one tablet every 6-8 hours as needed for itching. 30 capsule 1   ipratropium (ATROVENT HFA) 17 MCG/ACT inhaler Inhale 2 puffs into the lungs every 4 (four) hours as needed for wheezing. 12.9 g 5   ipratropium (ATROVENT HFA) 17 MCG/ACT inhaler Inhale 2 puffs into the lungs every 4 (four) hours as needed for wheezing. 1 each 5   levocetirizine (XYZAL) 5 MG tablet Take 1 tablet (5 mg total) by mouth in the morning and at bedtime. 60 tablet 5   montelukast (SINGULAIR) 10 MG tablet TAKE 1 TABLET(10 MG) BY MOUTH AT BEDTIME 90 tablet 1   Nebulizers (PARI PRONEB MAX LC SPRINT) MISC      nystatin (MYCOSTATIN) 100000 UNIT/ML suspension Take 5 mLs (500,000 Units total) by mouth 4 (four) times daily. 473 mL 1   omeprazole (PRILOSEC) 40 MG capsule Take 1 capsule (40 mg total) by mouth daily. 90 capsule 3   ondansetron (ZOFRAN-ODT) 4 MG disintegrating tablet Take 1 tablet (4 mg total) by mouth every 8 (eight) hours as needed for nausea or vomiting. 30 tablet 1   pantoprazole (PROTONIX) 40 MG tablet Take 40 mg by mouth 2 (two) times daily.     predniSONE (DELTASONE) 10 MG tablet Take prednisone 40mg  daily x 2 days, 30mg  daily x 2 days, 20mg  daily x 2 days and 10mg  daily x 2 days. 20 tablet 0   Skin  Protectants, Misc. (EUCERIN) cream Apply 1 Application topically daily as needed for dry skin.     tacrolimus (PROTOPIC) 0.03 % ointment Apply 1 Application topically 2 (two) times daily as needed (rash). (Patient taking differently: Apply 1 Application topically 2 (two) times daily as needed (recurrent rash).) 60 g 5   triamcinolone ointment (KENALOG) 0.1 % Apply 1 Application topically at bedtime. 30 g 5   Vibegron (GEMTESA) 75 MG TABS Take 1 tablet (75 mg total) by mouth daily. 30 tablet 11   Current Facility-Administered Medications on File Prior to Visit  Medication Dose Route Frequency Provider Last Rate Last Admin   benralizumab (FASENRA) prefilled syringe 30 mg  30 mg Subcutaneous Q8 Weeks Gallagher, Joel Louis, MD   30 mg at 03/17/24 1357        ROS:  All others reviewed and negative.  Objective        PE:  BP (!) 142/90 (BP Location: Right Arm, Patient Position: Sitting, Cuff Size: Normal)   Pulse 83   Temp 98.9 F (37.2 C) (Oral)   Ht 5\' 2"  (1.575 m)   Wt 192 lb (87.1 kg)   SpO2 95%   BMI 35.12 kg/m                 Constitutional: Pt appears in NAD               HENT: Head: NCAT.                Right Ear: External ear normal.                 Left Ear: External ear normal.                Eyes: . Pupils are equal, round, and reactive to light. Conjunctivae and EOM are normal               Nose: without d/c or deformity  Neck: Neck supple. Gross normal ROM               Cardiovascular: Normal rate and regular rhythm.                 Pulmonary/Chest: Effort normal and breath sounds without rales or wheezing.                Abd:  Soft, NT, ND, + BS, no organomegaly               Neurological: Pt is alert. At baseline orientation, motor grossly intact               Skin: Skin is warm. No rashes, no other new lesions, LE edema - none               Psychiatric: Pt behavior is normal without agitation   Micro: none  Cardiac tracings I have personally interpreted  today:  none  Pertinent Radiological findings (summarize): none   Lab Results  Component Value Date   WBC 8.8 12/25/2023   HGB 10.0 (L) 12/25/2023   HCT 31.6 (L) 12/25/2023   PLT 401.0 (H) 12/25/2023   GLUCOSE 71 12/25/2023   CHOL 171 12/25/2023   TRIG 64.0 12/25/2023   HDL 77.50 12/25/2023   LDLCALC 80 12/25/2023   ALT 16 12/25/2023   AST 22 12/25/2023   NA 141 12/25/2023   K 3.5 12/25/2023   CL 104 12/25/2023   CREATININE 0.69 12/25/2023   BUN 11 12/25/2023   CO2 32 12/25/2023   TSH 0.53 12/25/2023   INR 0.9 03/14/2023   HGBA1C 6.0 12/25/2023   MICROALBUR 1.0 06/18/2023   Assessment/Plan:  [name] is a 63 y.o. Black or African American [2] female with  has a past medical history of Anemia, iron deficiency (12/22/2014), Arthritis, ASTHMA (05/12/2009), Bipolar disorder (HCC), COPD (chronic obstructive pulmonary disease) (HCC), Eczema (05/18/2022), Fibromyalgia (05/14/2014), GERD (gastroesophageal reflux disease), History of kidney stones, Hyperlipidemia (04/20/2017), HYPERTENSION (05/12/2009), Peripheral vascular disease (HCC), Pneumonia, Prediabetes (02/23/2014), Seizure (HCC), Stroke (HCC) (11/2020), and Urticaria.  COPD/asthma Stable, cont current med tx  HTN (hypertension) BP Readings from Last 3 Encounters:  03/25/24 (!) 142/90  03/25/24 (!) 160/102  03/13/24 132/87   uncontrolled, pt for increased avapro 150 qd   Asthma-COPD overlap syndrome Stable overall, continue current med tx  Prediabetes Lab Results  Component Value Date   HGBA1C 6.0 12/25/2023   Stable, pt to continue current medical treatment  - diet, wt control   Depression Stable overall, cont current med tx  Vitamin D deficiency Last vitamin D Lab Results  Component Value Date   VD25OH 36.15 12/25/2023   Low, to start oral replacement  Followup: Return in about 3 months (around 06/23/2024).  Rosalia Colonel, MD 03/28/2024 3:50 PM [facility] [place]  Primary Care - [facility]

## 2024-03-25 NOTE — Patient Instructions (Addendum)
 PLEASE CALL YOUR PCP REGARDING SETTING UP AN APPOINTMENT TODAY OR TOMORROW.  IF YOUR CHEST PAIN GETS WORSE PLEASE GO TO THE ER OR URGENT CARE FOR FURTHER EVALUATION.  Steroid injection given today.  Take prednisone 40mg  daily x 2 days, 30mg  daily x 2 days, 20mg  daily x 2 days and 10mg  daily x 2 days. Monitor blood pressure and sugars.   For the next 3 days use albuterol nebulizer every 4 hours. For the next 7 days add on budesonide 0.5mg  nebulizer twice a day   1. Asthma-COPD Daily controller medication(s):  Breztri two puffs twice daily with spacer and rinse mouth afterwards. Ohtuvayre nebulizer twice daily Fasenra injections every month (spacing to every 8 weeks after 3 doses) During respiratory infections/flares:  Start budesonide 0.5mg  nebulizer twice a day for 1-2 weeks until your breathing symptoms return to baseline.  Pretreat with albuterol or albuterol nebulizer.  If you need to use your albuterol nebulizer machine back to back within 15-30 minutes with no relief then please go to the ER/urgent care for further evaluation.  May use albuterol rescue inhaler 2 puffs or nebulizer every 4 to 6 hours as needed for shortness of breath, chest tightness, coughing, and wheezing. May use albuterol rescue inhaler 2 puffs 5 to 15 minutes prior to strenuous physical activities. Monitor frequency of use - if you need to use it more than twice per week on a consistent basis let us know.  You can also use the Atrovent (green inhaler) 2 puffs every 4 to 6 hours as needed. Breathing control goals:  Full participation in all desired activities (may need albuterol before activity) Albuterol use two times or less a week on average (not counting use with activity) Cough interfering with sleep two times or less a month Oral steroids no more than once a year No hospitalizations  2. Chronic urticaria - Continue with levocetirizine twice daily EVERY DAY.  - Continue with famotidine twice daily EVERY  DAY.  - Continue with Doxepin 25mg  at night to help with itching AS NEEDED.  - Continue with triamcinolone twice daily as needed for your lesion.   3. 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 - HOLD allergy shots for now until your next visit.   Elevated blood pressure  Blood pressure reading was high in our office today. Vitals:   03/25/24 1037 03/25/24 1125  BP: (!) 150/88 (!) 140/100   Please follow up with PCP regarding this.   Please follow up in our clinic in 1 week.

## 2024-03-25 NOTE — Patient Instructions (Addendum)
 Please take all new medication as prescribed - the avapro (irbesartan) at 150 mg per day  Please continue all other medications as before, and refills have been done if requested - the Airsupra  Please have the pharmacy call with any other refills you may need.  Please keep your appointments with your specialists as you may have planned - pulmonary  Please make an Appointment to return in July 8, or sooner if needed

## 2024-03-25 NOTE — Progress Notes (Signed)
 Follow Up Note  RE: [name] MRN: [id number] DOB: [birth year] Date of Office Visit: 03/25/2024  Referring provider: Corwin Levins, MD Primary care provider: Corwin Levins, MD  Chief Complaint: Follow-up (Was sleeping and woke up, went into the kitchen to get something to drink. Couldn't make it back to the room without feeling like she was out of breath. Used rescue inhaler- 2 puffs with no results. Pain under right breast radiating around to her back and chest tightness. Currently wheezing. EMS came to her home yesterday, given 3 breathing treatments. )  History of Present Illness: I had the pleasure of seeing [name] for a follow up visit at the [facility] on 03/25/2024. She is a 63 y.o. female, who is being followed for asthma/copd, allergic rhinitis on AIT, chronic urticaria, recurrent infections. Her previous allergy office visit was on 03/03/2024 with Dr. Dellis Anes. Today is a new complaint visit of breathing issues .  Discussed the use of AI scribe software for clinical note transcription with the patient, who gave verbal consent to proceed.    She experienced breathing difficulties starting yesterday. She got her allergy injection and then went to [facility] later in the afternoon. While shopping at [facility], she felt dizzy and short of breath, prompting her to use her handheld nebulizer in the car. During the episode, she mistakenly used a maintenance inhaler instead of her albuterol inhaler. She called EMS, who provided three breathing treatments, which improved her symptoms.  She has a history of COPD and asthma, for which she uses multiple inhalers. Her current regimen includes Breztri, two puffs in the morning and two puffs at night, and she occasionally uses a spacer. She receives Fasenra injections every eight weeks, with her second shot administered recently. She has not had a steroid since her last hospital visit in December.  She  reports a recent increase in blood pressure, accompanied by a severe headache, which she attributes to drinking vinegar on her sister's advice. She is on blood pressure and cholesterol medications and takes them as prescribed. No recent fever or chills.  She is undergoing several diagnostic tests, including a lung cancer test, a stress test, and a sleep apnea test. She has not yet received the results of the lung cancer test.     Assessment and Plan: [name] is a 63 y.o. female with: Asthma with COPD with exacerbation (HCC) Exacerbation yesterday hours after allergy injection. Incorrect inhaler use noted. EMS had to give her neb x 3 which helped but now having issues with feeling shortness of breath again. Advised on correct inhaler use (was not using rescue medication as a rescue) and potential steroid side effects.  Patient had expiratory wheezing and limited air movement in the office. She was given duoneb x 2 treatments in the office with some improvement. Pulse ox in the low 90s range.  Today's spirometry showed severe obstructive disease and restriction with 8% and 50cc improvement in FEV1 post bronchodilator treatment. Clinically feeling improved.  Depo 80mg  IM given today. Take prednisone 40mg  daily x 2 days, 30mg  daily x 2 days, 20mg  daily x 2 days and 10mg  daily x 2 days. Monitor blood pressure and sugars.  For the next 3 days use albuterol nebulizer every 4 hours. For the next 7 days add on budesonide 0.5mg  nebulizer twice a day. Daily controller medication(s):  Breztri two puffs twice daily with spacer and rinse mouth afterwards Ohtuvayre nebulizer twice daily Fasenra injections every month (spacing  to every 8 weeks after 3 doses) During respiratory infections/flares:  Start budesonide 0.5mg  nebulizer twice a day for 1-2 weeks until your breathing symptoms return to baseline.  Pretreat with albuterol or albuterol nebulizer.  If you need to use your albuterol nebulizer machine back  to back within 15-30 minutes with no relief then please go to the ER/urgent care for further evaluation.  May use albuterol rescue inhaler 2 puffs or nebulizer every 4 to 6 hours as needed for shortness of breath, chest tightness, coughing, and wheezing. May use albuterol rescue inhaler 2 puffs 5 to 15 minutes prior to strenuous physical activities. Monitor frequency of use - if you need to use it more than twice per week on a consistent basis let us know.  You can also use the Atrovent (green inhaler) 2 puffs every 4 to 6 hours as needed.  Chronic urticaria Stable.  - Continue with levocetirizine twice daily EVERY DAY.  - Continue with famotidine twice daily EVERY DAY.  - Continue with Doxepin 25mg  at night to help with itching AS NEEDED.  - Continue with triamcinolone twice daily as needed for your lesion.   Perennial allergic rhinitis - Continue with Flonase 1 spray twice daily. - HOLD allergy shots for now until your next visit.   Chest pain, unspecified type Patient noted chest pain with tightness with slight improvement with the neb treatments. Given her medical history discussed concerns for heart attack which I don't have capabilities in our office to assess this.  Follow up with PCP today or go to [facility] for further evaluation.   Elevated blood pressure reading Please follow up with PCP regarding this.   Return in about 1 week (around 04/01/2024).  Meds ordered this encounter  Medications   ipratropium-albuterol (DUONEB) 0.5-2.5 (3) MG/3ML nebulizer solution 3 mL   predniSONE (DELTASONE) 10 MG tablet    Sig: Take prednisone 40mg  daily x 2 days, 30mg  daily x 2 days, 20mg  daily x 2 days and 10mg  daily x 2 days.    Dispense:  20 tablet    Refill:  0   budesonide (PULMICORT) 0.5 MG/2ML nebulizer solution    Sig: Take 2 mLs (0.5 mg total) by nebulization in the morning and at bedtime.    Dispense:  120 mL    Refill:  2   methylPREDNISolone acetate (DEPO-MEDROL) injection 80 mg    ipratropium-albuterol (DUONEB) 0.5-2.5 (3) MG/3ML nebulizer solution 3 mL   Lab Orders  No laboratory test(s) ordered today    Diagnostics: Spirometry:  Tracings reviewed. Her effort: It was hard to get consistent efforts and there is a question as to whether this reflects a maximal maneuver. FVC: 1.25L FEV1: 0.60L, 29% predicted FEV1/FVC ratio: 48% Interpretation: Spirometry consistent with severe obstructive disease and restriction with 8% and 50cc improvement in FEV1 post bronchodilator treatment. Clinically feeling improved.   Please see scanned spirometry results for details.  Results discussed with patient/family.   Medication List:  Current Outpatient Medications  Medication Sig Dispense Refill   albuterol (PROVENTIL) (2.5 MG/3ML) 0.083% nebulizer solution Take 3 mLs (2.5 mg total) by nebulization every 4 (four) hours as needed for wheezing or shortness of breath. 75 mL 1   albuterol (VENTOLIN HFA) 108 (90 Base) MCG/ACT inhaler INHALE 2 PUFFS INTO THE LUNGS EVERY 6 HOURS AS NEEDED FOR WHEEZING OR SHORTNESS OF BREATH 18 g 1   ARIPiprazole (ABILIFY) 10 MG tablet Take 1 tablet (10 mg total) by mouth daily. Take Abilify 20 mg po daily for  2 weeks till 9/17 , then continue taking 10 mg po daily 30 tablet 1   atorvastatin (LIPITOR) 40 MG tablet Take 1 tablet (40 mg total) by mouth daily. TAKE 1 TABLET(40 MG) BY MOUTH DAILY Strength: 40 mg 90 tablet 3   azelastine (OPTIVAR) 0.05 % ophthalmic solution Place 1 drop into both eyes 2 (two) times daily. 6 mL 12   benralizumab (FASENRA PEN) 30 MG/ML prefilled autoinjector Inject 1 mL (30 mg total) into the skin every 28 (twenty-eight) days. For 3 doses then every 8 weeks 1 mL 9   Budeson-Glycopyrrol-Formoterol (BREZTRI AEROSPHERE) 160-9-4.8 MCG/ACT AERO Inhale 2 puffs into the lungs in the morning and at bedtime. 10.7 g 11   budesonide (PULMICORT) 0.5 MG/2ML nebulizer solution Take 2 mLs (0.5 mg total) by nebulization in the morning and at  bedtime. 120 mL 2   carbamazepine (TEGRETOL) 200 MG tablet Take 2-3 tablets (400-600 mg total) by mouth See admin instructions. Take 400mg  in the morning and 600mg  at bedtime. 150 tablet 2   cetirizine (ZYRTEC) 10 MG tablet Can take one tablet by mouth once daily if needed for runny nose. 30 tablet 5   cromolyn (OPTICROM) 4 % ophthalmic solution Place 1 drop into both eyes 4 (four) times daily. 10 mL 12   doxepin (SINEQUAN) 25 MG capsule Take 1 capsule (25 mg total) by mouth at bedtime as needed. 30 capsule 5   Ensifentrine (OHTUVAYRE) 3 MG/2.5ML SUSP Inhale 3 mg into the lungs in the morning and at bedtime. 150 mL 11   estradiol (ESTRACE) 0.1 MG/GM vaginal cream Place 1g nightly 2-3 times a week 30 g 3   famotidine (PEPCID) 40 MG tablet Take 1 tablet (40 mg total) by mouth 2 (two) times daily. 60 tablet 5   ferrous sulfate 325 (65 FE) MG EC tablet TAKE 1 TABLET(325 MG) BY MOUTH DAILY WITH BREAKFAST 90 tablet 1   fluticasone (FLONASE) 50 MCG/ACT nasal spray SHAKE LIQUID AND USE 1 SPRAY IN EACH NOSTRIL TWICE DAILY AS NEEDED FOR ALLERGIES OR RHINITIS 16 g 5   formoterol (PERFOROMIST) 20 MCG/2ML nebulizer solution Take 20 mcg by nebulization 2 (two) times daily.     furosemide (LASIX) 20 MG tablet TAKE 1 TABLET BY MOUTH DAILY AS NEEDED 30 tablet 5   hydrOXYzine (VISTARIL) 25 MG capsule Take one tablet every 6-8 hours as needed for itching. 30 capsule 1   ipratropium (ATROVENT HFA) 17 MCG/ACT inhaler Inhale 2 puffs into the lungs every 4 (four) hours as needed for wheezing. 12.9 g 5   ipratropium (ATROVENT HFA) 17 MCG/ACT inhaler Inhale 2 puffs into the lungs every 4 (four) hours as needed for wheezing. 1 each 5   levocetirizine (XYZAL) 5 MG tablet Take 1 tablet (5 mg total) by mouth in the morning and at bedtime. 60 tablet 5   montelukast (SINGULAIR) 10 MG tablet TAKE 1 TABLET(10 MG) BY MOUTH AT BEDTIME 90 tablet 1   Nebulizers (PARI PRONEB MAX LC SPRINT) MISC      nystatin (MYCOSTATIN) 100000 UNIT/ML  suspension Take 5 mLs (500,000 Units total) by mouth 4 (four) times daily. 473 mL 1   omeprazole (PRILOSEC) 40 MG capsule Take 1 capsule (40 mg total) by mouth daily. 90 capsule 3   ondansetron (ZOFRAN-ODT) 4 MG disintegrating tablet Take 1 tablet (4 mg total) by mouth every 8 (eight) hours as needed for nausea or vomiting. 30 tablet 1   pantoprazole (PROTONIX) 40 MG tablet Take 40 mg by mouth 2 (two)  times daily.     predniSONE (DELTASONE) 10 MG tablet Take prednisone 40mg  daily x 2 days, 30mg  daily x 2 days, 20mg  daily x 2 days and 10mg  daily x 2 days. 20 tablet 0   Skin Protectants, Misc. (EUCERIN) cream Apply 1 Application topically daily as needed for dry skin.     tacrolimus (PROTOPIC) 0.03 % ointment Apply 1 Application topically 2 (two) times daily as needed (rash). (Patient taking differently: Apply 1 Application topically 2 (two) times daily as needed (recurrent rash).) 60 g 5   triamcinolone ointment (KENALOG) 0.1 % Apply 1 Application topically at bedtime. 30 g 5   Vibegron (GEMTESA) 75 MG TABS Take 1 tablet (75 mg total) by mouth daily. 30 tablet 11   irbesartan (AVAPRO) 150 MG tablet Take 1 tablet (150 mg total) by mouth daily. 90 tablet 3   Current Facility-Administered Medications  Medication Dose Route Frequency Provider Last Rate Last Admin   benralizumab (FASENRA) prefilled syringe 30 mg  30 mg Subcutaneous Q8 Thomes Dinning, MD   30 mg at 03/17/24 1357   Allergies: Allergies  Allergen Reactions   Sulfa Antibiotics Nausea And Vomiting and Other (See Comments)   Venofer [Iron Sucrose] Hives   I reviewed her past medical history, social history, family history, and environmental history and no significant changes have been reported from her previous visit.  Review of Systems  Constitutional:  Negative for appetite change, chills, fever and unexpected weight change.  HENT:  Negative for congestion and rhinorrhea.   Eyes:  Negative for itching.  Respiratory:   Positive for chest tightness, shortness of breath and wheezing.   Cardiovascular:  Positive for chest pain.  Gastrointestinal:  Negative for abdominal pain.  Genitourinary:  Negative for difficulty urinating.  Skin:  Negative for rash.  Allergic/Immunologic: Positive for environmental allergies.  Neurological:  Negative for headaches.    Objective: BP (!) 160/102 (BP Location: Left Arm, Patient Position: Sitting, Cuff Size: Normal)   Pulse 83   Temp 98 F (36.7 C) (Temporal)   Resp 18   SpO2 94%  There is no height or weight on file to calculate BMI. Physical Exam Vitals and nursing note reviewed.  Constitutional:      Appearance: She is well-developed.  HENT:     Head: Normocephalic and atraumatic.     Right Ear: Tympanic membrane and external ear normal.     Left Ear: Tympanic membrane and external ear normal.     Nose: Nose normal.     Mouth/Throat:     Mouth: Mucous membranes are moist.     Pharynx: Oropharynx is clear.  Eyes:     Conjunctiva/sclera: Conjunctivae normal.  Cardiovascular:     Rate and Rhythm: Normal rate and regular rhythm.     Heart sounds: Normal heart sounds. No murmur heard.    No friction rub. No gallop.  Pulmonary:     Effort: Pulmonary effort is normal.     Breath sounds: Wheezing present. No rhonchi or rales.     Comments: Diminished air flow throughout, some improvement post duoneb tx x 2 in the office.  Musculoskeletal:     Cervical back: Neck supple.  Skin:    General: Skin is warm.     Findings: No rash.  Neurological:     Mental Status: She is alert and oriented to person, place, and time.  Psychiatric:        Behavior: Behavior normal.    Previous notes and tests were  reviewed. The plan was reviewed with the patient/family, and all questions/concerned were addressed.  It was my pleasure to see Aerionna today and participate in her care. Please feel free to contact me with any questions or concerns.  Sincerely,  Wyline Mood,  DO Allergy & Immunology  [facility] office: [phone] [place] office: [phone]  Total Time: 70 minutes Time spent on day of service preparing to see patient; obtaining and reviewing separately obtained history;  performing examination; counseling and educating patient and family; ordering medications, tests and procedures; documenting clinical information in the health record; independently interpreting results and communicating to patient/family.

## 2024-03-26 ENCOUNTER — Other Ambulatory Visit: Payer: Self-pay | Admitting: Allergy & Immunology

## 2024-03-28 ENCOUNTER — Encounter: Payer: Self-pay | Admitting: Internal Medicine

## 2024-03-28 NOTE — Assessment & Plan Note (Signed)
Stable overall, continue current med tx

## 2024-03-28 NOTE — Assessment & Plan Note (Signed)
 Lab Results  Component Value Date   HGBA1C 6.0 12/25/2023   Stable, pt to continue current medical treatment - diet, wt control

## 2024-03-28 NOTE — Assessment & Plan Note (Signed)
Stable overall, cont current med tx 

## 2024-03-28 NOTE — Assessment & Plan Note (Signed)
 Last vitamin D Lab Results  Component Value Date   VD25OH 36.15 12/25/2023   Low, to start  oral replacement

## 2024-03-28 NOTE — Assessment & Plan Note (Signed)
 BP Readings from Last 3 Encounters:  03/25/24 (!) 142/90  03/25/24 (!) 160/102  03/13/24 132/87   uncontrolled, pt for increased avapro 150 qd

## 2024-03-28 NOTE — Assessment & Plan Note (Signed)
Stable, cont current med tx

## 2024-03-31 ENCOUNTER — Telehealth: Payer: Self-pay | Admitting: Allergy & Immunology

## 2024-03-31 NOTE — Telephone Encounter (Signed)
 [name] has been referred to   [name] [address] [place], [place] [address]  [phone]  I have faxed the referral and all corresponding notes to their office.  They will reach out to the patient to schedule.  Dr. Miki Alert is no longer accepting new patients so I referred her to anyone that can handle VCD.

## 2024-04-01 ENCOUNTER — Ambulatory Visit: Admitting: Internal Medicine

## 2024-04-06 ENCOUNTER — Ambulatory Visit: Payer: Medicare HMO | Admitting: Nurse Practitioner

## 2024-04-09 ENCOUNTER — Ambulatory Visit: Admitting: Allergy & Immunology

## 2024-04-09 ENCOUNTER — Encounter: Payer: Self-pay | Admitting: Allergy & Immunology

## 2024-04-09 ENCOUNTER — Encounter: Payer: Self-pay | Admitting: Internal Medicine

## 2024-04-09 ENCOUNTER — Other Ambulatory Visit: Payer: Self-pay | Admitting: Allergy & Immunology

## 2024-04-09 VITALS — BP 152/104 | HR 95 | Temp 97.4°F | Ht 63.0 in | Wt 190.0 lb

## 2024-04-09 DIAGNOSIS — D649 Anemia, unspecified: Secondary | ICD-10-CM

## 2024-04-09 DIAGNOSIS — B999 Unspecified infectious disease: Secondary | ICD-10-CM | POA: Diagnosis not present

## 2024-04-09 DIAGNOSIS — L508 Other urticaria: Secondary | ICD-10-CM

## 2024-04-09 DIAGNOSIS — J441 Chronic obstructive pulmonary disease with (acute) exacerbation: Secondary | ICD-10-CM

## 2024-04-09 DIAGNOSIS — J3089 Other allergic rhinitis: Secondary | ICD-10-CM

## 2024-04-09 DIAGNOSIS — K219 Gastro-esophageal reflux disease without esophagitis: Secondary | ICD-10-CM

## 2024-04-09 DIAGNOSIS — J383 Other diseases of vocal cords: Secondary | ICD-10-CM

## 2024-04-09 DIAGNOSIS — D75839 Thrombocytosis, unspecified: Secondary | ICD-10-CM

## 2024-04-09 MED ORDER — PREDNISONE 10 MG PO TABS
ORAL_TABLET | ORAL | 0 refills | Status: DC
Start: 2024-04-09 — End: 2024-05-25

## 2024-04-09 MED ORDER — AZITHROMYCIN 500 MG PO TABS: 500.0000 mg | ORAL_TABLET | ORAL | 0 refills | Status: AC

## 2024-04-09 NOTE — Patient Instructions (Addendum)
 1. Asthma-COPD overlap syndrome - Lung testing not done today. - Start the prednisone  taper today. - Start azithromycin  three times weekly until we see you again (Mon/Wed/Fri). - I want to see how you are doing with your new living situation.  - Continue with the albuterol  + budesonide  every 4-6 hours during the day.  - We will follow up on the chest CT to see what this shows.  - Daily controller medication(s): Breztri  two puffs twice daily + Ohtuvayre  nebulizer twice daily + Fasenra  every every 8 weeks after 3 doses - Prior to physical activity: Ventolin  (GRAY INHALER) 2 puffs 10-15 minutes before physical activity. - Rescue medications: albuterol  nebulizer one vial every 4-6 hours as needed OR Ventolin  (GRAY INHALER) 2 puffs AND 2 puffs Atrovent  (GREEN INHALER) every 4-6 hours as needed - Asthma control goals:  * Full participation in all desired activities (may need albuterol  before activity) * Albuterol  use two time or less a week on average (not counting use with activity) * Cough interfering with sleep two time or less a month * Oral steroids no more than once a year * No hospitalizations  2. Chronic urticaria - under good control - Continue with levocetirizine twice daily EVERY DAY.  - Continue with famotidine  twice daily EVERY DAY.  - Continue with Doxepin  25mg  at night to help with itching AS NEEDED.  - Continue with triamcinolone  twice daily as needed for your lesion.   3. 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 - Continue to hold the allergy  shots.   4. GERD - Continue famotidine  daily . - Continue omeprazole  daily.  5. Return in about 6 weeks (around 05/21/2024). You can have the follow up appointment with Dr. Idolina Maker or a Nurse Practicioner (our Nurse Practitioners are excellent and always have Physician oversight!).    Please inform us  of any Emergency Department visits, hospitalizations, or  changes in symptoms. Call us  before going to the ED for breathing or allergy  symptoms since we might be able to fit you in for a sick visit. Feel free to contact us  anytime with any questions, problems, or concerns.  It was a pleasure to see you again today!   Websites that have reliable patient information: 1. American Academy of Asthma, Allergy , and Immunology: www.aaaai.org 2. Food Allergy  Research and Education (FARE): foodallergy.org 3. Mothers of Asthmatics: http://www.asthmacommunitynetwork.org 4. [employer] of Allergy , Asthma, and Immunology: www.acaai.org      "Like" us  on Facebook and Instagram for our latest updates!      A healthy democracy works best when [employer] participate! Make sure you are registered to vote! If you have moved or changed any of your contact information, you will need to get this updated before voting! Scan the QR codes below to learn more!

## 2024-04-09 NOTE — Progress Notes (Signed)
 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 Liver cirrhosis via chest CT (March 2024)    Perennial allergic rhinitis (dust mites) - on allergen immunotherapy   Chronic urticaria - controlled with antihistamines alone   Recurrent infections - with largely normal workup in 2020 and then lackluster Streptococcal titers in November 2023   Complicated past medical history, including disability as well as bipolar 1 depression and vitamin D  deficiency   Urinary urgency - improved following visit with Urology   She is audibly wheezing today, but on exam the wheezing is not coming from her lungs at all.  It seems to be more of an upper airway issue. We have referred her to see ENT for evaluation of VCD. I do want to see how she does once she is out of her current environment. We are going to put her on azithromycin  3 times weekly to see if this provides some more anti-inflammatory activity to help control her breathing. I do not want to go around changing her biologic right now. We are going to get some repeat immune labs.    Plan/Recommendations:   1. Asthma-COPD overlap syndrome - Lung testing not done today. - Start the prednisone  taper today. - Start azithromycin  three times weekly until we see you again (Mon/Wed/Fri). - I want to see how you are doing with your new living situation.  - Continue with the albuterol  + budesonide  every 4-6 hours during the day.  - We will follow up on the chest CT to see what this shows.  - Daily  controller medication(s): Breztri  two puffs twice daily + Ohtuvayre  nebulizer twice daily + Fasenra  every every 8 weeks after 3 doses - Prior to physical activity: Ventolin  (GRAY INHALER) 2 puffs 10-15 minutes before physical activity. - Rescue medications: albuterol  nebulizer one vial every 4-6 hours as needed OR Ventolin  (GRAY INHALER) 2 puffs AND 2 puffs Atrovent  (GREEN INHALER) every 4-6 hours as needed - Asthma control goals:  * Full participation in all desired activities (may need albuterol  before activity) * Albuterol  use two time or less a week on average (not counting use with activity) * Cough interfering with sleep two time or less a month * Oral steroids no more than once a year * No hospitalizations  2. Chronic urticaria - under good control - Continue with levocetirizine twice daily EVERY DAY.  - Continue with famotidine  twice daily EVERY DAY.  - Continue with Doxepin  25mg  at night to help with itching AS NEEDED.  - Continue with triamcinolone  twice daily as needed for your lesion.   3. 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 - Continue to hold the allergy  shots.   4. GERD - Continue famotidine  daily . - Continue omeprazole  daily.  5. Return in about 6 weeks (around 05/21/2024). You can have the follow up appointment with Dr. Idolina Maker or a Nurse Practicioner (our Nurse Practitioners are excellent and always have Physician oversight!).    Subjective:  Ethelene Closser is a 63 y.o. female presenting today for follow up of  Chief Complaint  Patient presents with   Allergic Rhinitis     Has been waking up with puffy eyes   Asthma    Wheezing all week, was on prednisone  last week.   COPD   Wheezing    Has been wheezing all week, using albuterol  and nebulizer all week   Shortness of Breath    Having SOB today and wheezing audibly.   Breathing Problem    wheezing   Headache     Has had a headache all week.    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   Hypokalemia 08/12/2023   Hyperglycemia 08/12/2023   History of seizures 08/12/2023   Obesity (BMI 30-39.9) 08/12/2023   Transaminitis 08/12/2023   COVID-19 virus infection 08/07/2023   Peripheral edema 06/18/2023   Cirrhosis (HCC) 03/07/2023   Vocal cord dysfunction 03/05/2023   Urge urinary incontinence 01/25/2023   Influenza A 01/11/2023   Bilateral impacted cerumen 12/04/2022   Not well controlled severe persistent asthma 10/25/2022   Abnormal TSH 09/13/2022   Chronic sinusitis 08/14/2022   PND (paroxysmal nocturnal dyspnea) 06/07/2022   Vitamin D  deficiency 05/20/2022   Eczema 05/18/2022   Insomnia 05/18/2022   Allergic conjunctivitis 05/18/2022   Chronic respiratory failure with hypoxia and hypercapnia (HCC) 04/30/2022   Bilateral hearing loss 04/14/2022   Moderate persistent asthma without complication 02/02/2022   Low back pain 10/11/2021   Abnormal CXR 07/17/2021   Aortic atherosclerosis (HCC) 07/05/2021   URI (upper respiratory infection) 07/05/2021   Arthrodesis status 06/20/2021   Drug allergy  05/14/2021   Acute cough 05/14/2021   Bilateral leg weakness 04/17/2021   Status post cervical spinal fusion 12/27/2020   History of stroke 12/11/2020   Medication management 10/21/2020   Body mass index (BMI) 26.0-26.9, adult 08/23/2020   Healthcare maintenance 08/11/2020   Cervical  spondylosis with myelopathy and radiculopathy 07/28/2020   Preop exam for internal medicine 06/19/2020   Hypotension due to medication 06/11/2020   Other spondylosis with myelopathy, cervical region 02/16/2020   Cervical spondylosis 02/02/2020   Radiculopathy, cervical region 02/02/2020   Spondylolysis, cervical region 02/02/2020   Neck pain 02/02/2020   Leg cramping 08/12/2019   Laryngopharyngeal reflux (LPR) 05/26/2019   Sinus pressure 05/26/2019   Hypophosphatemia 01/07/2019   Vertigo 09/23/2018   Dysfunction of left eustachian tube 09/23/2018   Gait disorder 04/21/2018   Leukocytosis 04/15/2018   Nausea & vomiting 04/15/2018   Seizure disorder (HCC)    Prolapsed cervical intervertebral disc 03/11/2018   Cervical radiculopathy 02/18/2018   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   Syncope 09/04/2017   Constipation 09/04/2017   Nonintractable headache 09/04/2017   Leg swelling 07/26/2017   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   Hyperlipidemia 04/20/2017   Pedal edema 04/19/2017   Bipolar disorder (HCC) 09/19/2016   Facial pain 07/12/2016   Rash 06/14/2016   Migraine 01/25/2016   Idiopathic urticaria/pruritus 01/23/2016  Allergic rhinitis 01/23/2016   Oral candidiasis 01/23/2016   Greater trochanteric bursitis of both hips 09/08/2015   Chest pain 06/22/2015   Itching 06/22/2015   Bilateral knee pain 06/22/2015   Bilateral hip pain 06/22/2015   Chronic iron  deficiency anemia 12/22/2014   Fatigue 12/21/2014   Rash and nonspecific skin eruption 08/24/2014   Intertrigo 08/07/2014   Dyspareunia in female 07/12/2014   Menopausal syndrome (hot flushes) 07/12/2014   History of tobacco abuse 07/12/2014   Dizziness 06/22/2014   Weakness 06/22/2014   Fibromyalgia 05/14/2014   Encounter for well adult exam with abnormal findings 04/22/2014   Recurrent boils 04/22/2014   Recurrent falls 04/22/2014   Peripheral vascular disease (HCC)    Depression    Anxiety    GERD without  esophagitis    Prediabetes 02/23/2014   COPD exacerbation (HCC) 02/23/2014   Encounter for screening mammogram for high-risk patient 02/23/2014   Back pain 07/22/2013   COPD/asthma 08/24/2009   Headache(784.0) 08/24/2009   HTN (hypertension) 05/12/2009   Asthma-COPD overlap syndrome (HCC) 05/12/2009    History obtained from: chart review and patient.  Discussed the use of AI scribe software for clinical note transcription with the patient and/or guardian, who gave verbal consent to proceed.  Bevan is a 63 y.o. female presenting for a follow up visit.  She was last seen in April 2025.  At that time, Dr. Burdette Carolin treated her for a COPD exacerbation.  She apparently had to call EMS and they gave her 3 neb treatments in the field.  She had severe obstructive disease she was given a Depo-Medrol  injection and started on a prednisone  taper.  For the past two weeks, she has experienced worsening respiratory symptoms, including a heaviness in her chest and difficulty breathing. These symptoms have significantly impacted her daily activities, confining her to bed for most of the week. She uses her nebulizer approximately ten times a day. Her current environment, which includes exposure to marijuana and cigarette smoke, exacerbates her symptoms. She is going to be moving to another place in the next month, which she hopes will help.   We did review her medications and she is taking them as prescribed. This includes her newest new nebulized medication which she is taking twice daily. She is using her budesonide  mixed with albuterol  3-4 times daily. The prednisone  that she got from Dr. Burdette Carolin did not really make an impact at all.    Her asthma management has included treatments such as Fasenra , Dupixent , and Tezspire . She received a Fasenra  injection on April 1st, 2025. Dupixent  initially seemed more effective, but she continued to experience frequent illnesses. She also uses budesonide  and albuterol  in her  nebulizer. Prednisone  is effective at higher doses but not at lower doses like 10 mg. She has not had antibiotics recently and mentions that azithromycin  is not one of her allergies.   She has experienced voice changes and has been on famotidine  and omeprazole  for reflux, which has helped reduce nocturnal symptoms. She previously underwent speech therapy for a month, which included voice exercises. No recent sinus CT has been performed. I cannot find a recent one in the last few years.    Otherwise, there have been no changes to her past medical history, surgical history, family history, or social history.    Review of systems otherwise negative other than that mentioned in the HPI.    Objective:   Blood pressure (!) 152/104, pulse 95, temperature (!) 97.4 F (36.3 C), height 5\' 3"  (  1.6 m), weight 190 lb (86.2 kg), SpO2 91%. Body mass index is 33.66 kg/m.    Physical Exam Vitals reviewed.  Constitutional:      Appearance: Normal appearance. She is well-developed.     Comments: Talkative. Talking in full sentences.  HENT:     Head: Normocephalic and atraumatic.     Right Ear: Tympanic membrane, ear canal and external ear normal.     Left Ear: Tympanic membrane, ear canal and external ear normal.     Nose: No nasal deformity, septal deviation, mucosal edema, congestion or rhinorrhea.     Right Turbinates: Enlarged, swollen and pale.     Left Turbinates: Enlarged, swollen and pale.     Right Sinus: No maxillary sinus tenderness or frontal sinus tenderness.     Left Sinus: No maxillary sinus tenderness or frontal sinus tenderness.     Comments: No nasal polyps noted.     Mouth/Throat:     Lips: Pink.     Mouth: Mucous membranes are moist. Mucous membranes are not pale and not dry.     Pharynx: Uvula midline.     Comments: Cobblestoning present in the posterior oropharynx.  Eyes:     General: Lids are normal. Allergic shiner present.        Right eye: No discharge.        Left  eye: No discharge.     Conjunctiva/sclera: Conjunctivae normal.     Right eye: Right conjunctiva is not injected. No chemosis.    Left eye: Left conjunctiva is not injected. No chemosis.    Pupils: Pupils are equal, round, and reactive to light.  Cardiovascular:     Rate and Rhythm: Normal rate and regular rhythm.     Heart sounds: Normal heart sounds.  Pulmonary:     Effort: Pulmonary effort is normal. No tachypnea, accessory muscle usage, prolonged expiration or respiratory distress.     Breath sounds: Normal breath sounds. Decreased air movement present. No stridor. No wheezing, rhonchi or rales.     Comments: Audibly wheezing but this seems to be centered on the upper airway and not in the lungs itself. She is moving some air in her lungs and is talkative. She seems to be breathing comfortably.  Chest:     Chest wall: No tenderness.  Lymphadenopathy:     Cervical: No cervical adenopathy.  Skin:    General: Skin is warm.     Capillary Refill: Capillary refill takes less than 2 seconds.     Coloration: Skin is not pale.     Findings: No abrasion, erythema, petechiae or rash. Rash is not papular, urticarial or vesicular.     Comments: Skin looks clear.   Neurological:     Mental Status: She is alert.  Psychiatric:        Behavior: Behavior is cooperative.      Diagnostic studies: none       Drexel Gentles, MD  [facility] 

## 2024-04-15 ENCOUNTER — Other Ambulatory Visit: Payer: Self-pay | Admitting: Acute Care

## 2024-04-15 DIAGNOSIS — Z87891 Personal history of nicotine dependence: Secondary | ICD-10-CM

## 2024-04-15 DIAGNOSIS — Z122 Encounter for screening for malignant neoplasm of respiratory organs: Secondary | ICD-10-CM

## 2024-04-20 ENCOUNTER — Telehealth (HOSPITAL_COMMUNITY): Payer: Self-pay | Admitting: Internal Medicine

## 2024-04-20 NOTE — Telephone Encounter (Signed)
 Unfortunately I have nothing to offer; ok for forward to Prior Authorization office please

## 2024-04-20 NOTE — Telephone Encounter (Signed)
 We have attempted to contact the ordering providers office to obtain a prior authorization for the ordered test Myoview.  However, we have been unsuccesful. Order will be removed from the active order WQ. Once the prior authorization is obtained we will reinstate the order and schedule patient.   Thank you

## 2024-04-21 ENCOUNTER — Ambulatory Visit: Payer: Medicare HMO | Attending: Cardiology | Admitting: Cardiology

## 2024-04-21 ENCOUNTER — Encounter: Payer: Self-pay | Admitting: Cardiology

## 2024-04-21 VITALS — BP 168/96 | HR 86 | Ht 63.0 in | Wt 197.0 lb

## 2024-04-21 DIAGNOSIS — I700 Atherosclerosis of aorta: Secondary | ICD-10-CM

## 2024-04-21 DIAGNOSIS — R072 Precordial pain: Secondary | ICD-10-CM | POA: Diagnosis not present

## 2024-04-21 DIAGNOSIS — R0609 Other forms of dyspnea: Secondary | ICD-10-CM | POA: Diagnosis not present

## 2024-04-21 MED ORDER — AMLODIPINE BESYLATE 5 MG PO TABS: 5.0000 mg | ORAL_TABLET | Freq: Every day | ORAL | 3 refills | Status: AC

## 2024-04-21 NOTE — Progress Notes (Signed)
 Cardiology Office Note:  .   Date:  04/21/2024  ID:  [name], DOB 01-12-1961, MRN [id number] PCP: Roslyn Coombe, MD  [facility] Providers Cardiologist:  Fransico Ivy, MD PCP: Roslyn Coombe, MD  Chief Complaint  Patient presents with   aortic atherosclerosis   Hypertension     [name] is a 63 y.o. female with hypertension, aortic atherosclerosis, asthma, COPD overlap syndrome, with exertional dyspnea and chest pain  Discussed the use of AI scribe software for clinical note transcription with the patient, who gave verbal consent to proceed.  History of Present Illness [name] "[name]" is a 63 year old female with asthma and hypertension who presents with wheezing and chest pain.  She experiences significant wheezing despite regular use of inhalers, with persistent symptoms even after inhaler use. She has been hospitalized approximately eleven to twelve times over the past year due to respiratory issues. Episodes of gasping for breath during sleep have been ongoing for about a year.  Chest pain occurs when she is upset or emotionally distressed, lasting for a couple of minutes. She is uncertain if the pain is related to acid reflux or another cause. She takes acid reflux medication but does not use nitroglycerin  for the pain.  She experiences difficulty walking due to asthma attacks, with walking a certain distance triggering these attacks. A sensation of heaviness in her chest when walking limits her physical activity.  She has elevated blood pressure and is currently taking irbesartan . Her blood pressure is often high. She also takes Lasix  as needed for swelling, which she uses when she notices swelling in her ankles.    Vitals:   04/21/24 1050  BP: (!) 168/96  Pulse: 86  SpO2: 97%      Review of Systems  Cardiovascular:  Positive for chest pain, dyspnea on exertion and leg swelling  (Occasional). Negative for palpitations and syncope.        Studies Reviewed: Aaron Aas        Independently interpreted (ordered by other providers): 1-03/2024: Chol 171, TG 64, HDL 77, LDL 80 HbA1C 6.0% Hb 10.8 Cr 0.69   CT chest lung cancer screening 03/11/2024: Aortic Atherosclerosis  Emphysema  Independently interpreted Echocardiogram 2024 (Ordered by hospitalist):LVEF 60-65%. Normal diastolic function. Aortic valve not well visualized. Mild calcification., No significant stenosis or regurgitation.    Physical Exam Vitals and nursing note reviewed.  Constitutional:      General: She is not in acute distress. Neck:     Vascular: No JVD.  Cardiovascular:     Rate and Rhythm: Normal rate and regular rhythm.     Heart sounds: Normal heart sounds. No murmur heard. Pulmonary:     Effort: Pulmonary effort is normal.     Breath sounds: Examination of the right-upper field reveals wheezing. Examination of the left-upper field reveals wheezing. Examination of the right-middle field reveals wheezing. Examination of the left-middle field reveals wheezing. Examination of the right-lower field reveals decreased breath sounds. Examination of the left-lower field reveals decreased breath sounds. Decreased breath sounds and wheezing present. No rales.  Musculoskeletal:     Right lower leg: No edema.     Left lower leg: No edema.      VISIT DIAGNOSES:   ICD-10-CM   1. Exertional dyspnea  R06.09 Pro b natriuretic peptide (BNP)    ECHOCARDIOGRAM COMPLETE    NM PET CT CARDIAC PERFUSION MULTI W/ABSOLUTE BLOODFLOW    Cardiac Stress Test: Informed Consent Details:  Physician/Practitioner Attestation; Transcribe to consent form and obtain patient signature    2. Precordial pain  R07.2 NM PET CT CARDIAC PERFUSION MULTI W/ABSOLUTE BLOODFLOW    Cardiac Stress Test: Informed Consent Details: Physician/Practitioner Attestation; Transcribe to consent form and obtain patient signature    3. Aortic  atherosclerosis (HCC)  I70.0 Cardiac Stress Test: Informed Consent Details: Physician/Practitioner Attestation; Transcribe to consent form and obtain patient signature       [name] is a 63 y.o. female with hypertension, aortic atherosclerosis, asthma, COPD overlap syndrome, with exertional dyspnea and chest pain Assessment & Plan Exertional dyspnea, chest pain: She has severe asthma/COPD overlap syndrome with wheezing at rest, albeit without any resting hypoxia.  While this is more likely because of her symptoms, cannot exclude obstructive CAD in the setting of baseline aortic atherosclerosis. Recommend echocardiogram and PET/CT stress test.  With her obesity, PET/CT stress test with laboratory specificity for evaluation of ischemia.  Hypertension: Uncontrolled.  Continue irbesartan .  Added amlodipine 5 mg daily.  Recommend regular monitoring of blood pressure at home.  Asthma/COPD: Chronic wheezing and dyspnea with exertion - Continue current inhaler regimen. - Follow up with primary care physician and pulmonologist.  Urinary and bowel incontinence Recommend follow-up with PCP.      Informed Consent   Shared Decision Making/Informed Consent The risks [chest pain, shortness of breath, cardiac arrhythmias, dizziness, blood pressure fluctuations, myocardial infarction, stroke/transient ischemic attack, nausea, vomiting, allergic reaction, radiation exposure, metallic taste sensation and life-threatening complications (estimated to be 1 in 10,000)], benefits (risk stratification, diagnosing coronary artery disease, treatment guidance) and alternatives of a cardiac PET stress test were discussed in detail with Ms. Ogasawara and she agrees to proceed.       No orders of the defined types were placed in this encounter.    F/u in 3 months  Signed, Cody Das, MD

## 2024-04-21 NOTE — Patient Instructions (Addendum)
 Medication Instructions:  START Amlodipine 5 mg take one tablet by mouth daily   *If you need a refill on your cardiac medications before your next appointment, please call your pharmacy*  Lab Work: Probnp  If you have labs (blood work) drawn today and your tests are completely normal, you will receive your results only by: MyChart Message (if you have MyChart) OR A paper copy in the mail If you have any lab test that is abnormal or we need to change your treatment, we will call you to review the results.  Testing/Procedures: Echo   Your physician has requested that you have an echocardiogram. Echocardiography is a painless test that uses sound waves to create images of your heart. It provides your doctor with information about the size and shape of your heart and how well your heart's chambers and valves are working. This procedure takes approximately one hour. There are no restrictions for this procedure. Please do NOT wear cologne, perfume, aftershave, or lotions (deodorant is allowed). Please arrive 15 minutes prior to your appointment time.  Please note: We ask at that you not bring children with you during ultrasound (echo/ vascular) testing. Due to room size and safety concerns, children are not allowed in the ultrasound rooms during exams. Our front office staff cannot provide observation of children in our lobby area while testing is being conducted. An adult accompanying a patient to their appointment will only be allowed in the ultrasound room at the discretion of the ultrasound technician under special circumstances. We apologize for any inconvenience.   PET/CT   How to Prepare for Your Cardiac PET/CT Stress Test:  1. Please do not take these medications before your test:  ~Medications that may interfere with the cardiac pharmacological stress agent (ex. nitrates - including erectile dysfunction medications, isosorbide mononitrate, tamulosin or beta-blockers) the day of the  exam. (Erectile dysfunction medication should be held for at least 72 hrs prior to test) ~Theophylline containing medications for 12 hours. ~Dipyridamole 48 hours prior to the test. ~Your remaining medications may be taken with water.  2. Nothing to eat or drink, except water, 3 hours prior to arrival time.   ~ NO caffeine /decaffeinated products, or chocolate 12 hours prior to arrival.  3. NO perfume, cologne or lotion on chest or abdomen area.         - FEMALES - Please avoid wearing dresses to this appointment.  4. Total time is 1 to 2 hours; you may want to bring reading material for the waiting time.  Please report to Radiology at the [facility] Main Entrance 30 minutes early for your test. [address] [place], [place] [address]  OR  Please report to Radiology at [facility] Main Entrance, medical mall, 30 mins prior to your test. [address] [place], [place] [phone]  Diabetic Preparation: - Hold oral medications. - You may take NPH and Lantus insulin . - Do not take Humalog or Humulin R  (Regular Insulin ) the day of your test. - Check blood sugars prior to leaving the house. - If able to eat breakfast prior to 3 hour fasting, you may take all medications, including your insulin , - Do not worry if you miss your breakfast dose of insulin  - start at your next meal. - Patients who wear a continuous glucose monitor MUST remove the device prior to scanning.  IF YOU THINK YOU MAY BE PREGNANT, OR ARE NURSING PLEASE INFORM THE TECHNOLOGIST.  In preparation for your appointment, medication  and supplies will be purchased.  Appointment availability is limited, so if you need to cancel or reschedule, please call the Radiology Department at [phone] [facility]) OR [phone] [facility])  24 hours in advance to avoid a cancellation fee of $100.00  What to Expect After you Arrive:  Once you arrive and check in for your appointment, you  will be taken to a preparation room within the Radiology Department.  A technologist or Nurse will obtain your medical history, verify that you are correctly prepped for the exam, and explain the procedure.  Afterwards,  an IV will be started in your arm and electrodes will be placed on your skin for EKG monitoring during the stress portion of the exam. Then you will be escorted to the PET/CT scanner.  There, staff will get you positioned on the scanner and obtain a blood pressure and EKG.  During the exam, you will continue to be connected to the EKG and blood pressure machines.  A small, safe amount of a radioactive tracer will be injected in your IV to obtain a series of pictures of your heart along with an injection of a stress agent.    After your Exam:  It is recommended that you eat a meal and drink a caffeinated beverage to counter act any effects of the stress agent.  Drink plenty of fluids for the remainder of the day and urinate frequently for the first couple of hours after the exam.  Your doctor will inform you of your test results within 7-10 business days.  For more information and frequently asked questions, please visit our website : [email or web address]  For questions about your test or how to prepare for your test, please call: Cardiac Imaging Nurse Navigators Office: [phone]     Follow-Up: At [facility] ([facility]), you and your health needs are our priority.  As part of our continuing mission to provide you with exceptional heart care, our providers are all part of one team.  This team includes your primary Cardiologist (physician) and Advanced Practice Providers or APPs (Physician Assistants and Nurse Practitioners) who all work together to provide you with the care you need, when you need it.  Your next appointment:   3 month(s)  Provider:   APP  We recommend signing up for the patient portal called "MyChart".  Sign up information is provided on this  After Visit Summary.  MyChart is used to connect with patients for Virtual Visits (Telemedicine).  Patients are able to view lab/test results, encounter notes, upcoming appointments, etc.  Non-urgent messages can be sent to your provider as well.   To learn more about what you can do with MyChart, go to [email or web address].

## 2024-04-22 ENCOUNTER — Encounter: Payer: Self-pay | Admitting: Cardiology

## 2024-04-22 LAB — PRO B NATRIURETIC PEPTIDE: NT-Pro BNP: 127 pg/mL (ref 0–287)

## 2024-04-24 ENCOUNTER — Ambulatory Visit (HOSPITAL_BASED_OUTPATIENT_CLINIC_OR_DEPARTMENT_OTHER): Attending: Nurse Practitioner | Admitting: Pulmonary Disease

## 2024-04-24 ENCOUNTER — Encounter (HOSPITAL_BASED_OUTPATIENT_CLINIC_OR_DEPARTMENT_OTHER): Payer: Self-pay

## 2024-04-24 DIAGNOSIS — Z789 Other specified health status: Secondary | ICD-10-CM

## 2024-04-24 DIAGNOSIS — G4733 Obstructive sleep apnea (adult) (pediatric): Secondary | ICD-10-CM | POA: Diagnosis present

## 2024-04-24 DIAGNOSIS — G4734 Idiopathic sleep related nonobstructive alveolar hypoventilation: Secondary | ICD-10-CM | POA: Insufficient documentation

## 2024-04-28 ENCOUNTER — Ambulatory Visit: Payer: Self-pay | Admitting: Allergy & Immunology

## 2024-04-28 DIAGNOSIS — D75839 Thrombocytosis, unspecified: Secondary | ICD-10-CM

## 2024-04-28 NOTE — Addendum Note (Signed)
 Addended by: Roslyn Coombe on: 04/28/2024 09:15 PM   Modules accepted: Orders

## 2024-04-30 LAB — STREP PNEUMONIAE 23 SEROTYPES IGG
Pneumo Ab Type 1*: <0.1 ug/mL — ABNORMAL LOW (ref >1.3)
Pneumo Ab Type 12 (12F)*: <0.1 ug/mL — ABNORMAL LOW (ref >1.3)
Pneumo Ab Type 14*: <0.1 ug/mL — ABNORMAL LOW (ref >1.3)
Pneumo Ab Type 17 (17F)*: <0.1 ug/mL — ABNORMAL LOW (ref >1.3)
Pneumo Ab Type 19 (19F)*: <0.1 ug/mL — ABNORMAL LOW (ref >1.3)
Pneumo Ab Type 2*: <0.2 ug/mL — ABNORMAL LOW (ref >1.3)
Pneumo Ab Type 20*: <0.2 ug/mL — ABNORMAL LOW (ref >1.3)
Pneumo Ab Type 22 (22F)*: 0.3 ug/mL — ABNORMAL LOW (ref >1.3)
Pneumo Ab Type 23 (23F)*: 0.3 ug/mL — ABNORMAL LOW (ref >1.3)
Pneumo Ab Type 26 (6B)*: <0.1 ug/mL — ABNORMAL LOW (ref >1.3)
Pneumo Ab Type 3*: <0.1 ug/mL — ABNORMAL LOW (ref >1.3)
Pneumo Ab Type 34 (10A)*: 0.2 ug/mL — ABNORMAL LOW (ref >1.3)
Pneumo Ab Type 4*: <0.1 ug/mL — ABNORMAL LOW (ref >1.3)
Pneumo Ab Type 43 (11A)*: <0.1 ug/mL — ABNORMAL LOW (ref >1.3)
Pneumo Ab Type 5*: <0.1 ug/mL — ABNORMAL LOW (ref >1.3)
Pneumo Ab Type 51 (7F)*: 1.7 ug/mL (ref >1.3)
Pneumo Ab Type 54 (15B)*: <0.2 ug/mL — ABNORMAL LOW (ref >1.3)
Pneumo Ab Type 56 (18C)*: <0.1 ug/mL — ABNORMAL LOW (ref >1.3)
Pneumo Ab Type 57 (19A)*: 3 ug/mL (ref >1.3)
Pneumo Ab Type 68 (9V)*: 0.6 ug/mL — ABNORMAL LOW (ref >1.3)
Pneumo Ab Type 70 (33F)*: 0.3 ug/mL — ABNORMAL LOW (ref >1.3)
Pneumo Ab Type 8*: <0.3 ug/mL — ABNORMAL LOW (ref >1.3)
Pneumo Ab Type 9 (9N)*: <0.1 ug/mL — ABNORMAL LOW (ref >1.3)

## 2024-04-30 LAB — CBC WITH DIFF/PLATELET
Basophils Absolute: 0 10*3/uL (ref 0.0–0.2)
Basos: 0 %
EOS (ABSOLUTE): 0 10*3/uL (ref 0.0–0.4)
Eos: 0 %
Hematocrit: 35 % (ref 34.0–46.6)
Hemoglobin: 10.8 g/dL — ABNORMAL LOW (ref 11.1–15.9)
Immature Grans (Abs): 0.1 10*3/uL (ref 0.0–0.1)
Immature Granulocytes: 1 %
Lymphocytes Absolute: 1.7 10*3/uL (ref 0.7–3.1)
Lymphs: 16 %
MCH: 26.5 pg — ABNORMAL LOW (ref 26.6–33.0)
MCHC: 30.9 g/dL — ABNORMAL LOW (ref 31.5–35.7)
MCV: 86 fL (ref 79–97)
Monocytes Absolute: 0.8 10*3/uL (ref 0.1–0.9)
Monocytes: 7 %
Neutrophils Absolute: 8.3 10*3/uL — ABNORMAL HIGH (ref 1.4–7.0)
Neutrophils: 76 %
Platelets: 451 10*3/uL — ABNORMAL HIGH (ref 150–450)
RBC: 4.08 x10E6/uL (ref 3.77–5.28)
RDW: 16.1 % — ABNORMAL HIGH (ref 11.7–15.4)
WBC: 10.9 10*3/uL — ABNORMAL HIGH (ref 3.4–10.8)

## 2024-04-30 LAB — IGG, IGA, IGM
IgA/Immunoglobulin A, Serum: 113 mg/dL (ref 87–352)
IgG (Immunoglobin G), Serum: 803 mg/dL (ref 586–1602)
IgM (Immunoglobulin M), Srm: 61 mg/dL (ref 26–217)

## 2024-04-30 LAB — COMPLEMENT, TOTAL: Compl, Total (CH50): >60 U/mL (ref >41)

## 2024-04-30 LAB — DIPHTHERIA / TETANUS ANTIBODY PANEL
Diphtheria Ab: 0.27 [IU]/mL (ref <0.10)
Tetanus Ab, IgG: 0.59 [IU]/mL (ref <0.10)

## 2024-05-04 ENCOUNTER — Telehealth: Payer: Self-pay | Admitting: Allergy & Immunology

## 2024-05-04 NOTE — Telephone Encounter (Signed)
 It looks like Dr. Idolina Maker and Dr. Rosalia Colonel had sent referrals to Oncology/Hematology on the same day.  From what I can see, neither referral has been worked.

## 2024-05-05 NOTE — Telephone Encounter (Signed)
 error

## 2024-05-08 ENCOUNTER — Ambulatory Visit: Payer: Self-pay | Admitting: Nurse Practitioner

## 2024-05-08 DIAGNOSIS — G4733 Obstructive sleep apnea (adult) (pediatric): Secondary | ICD-10-CM | POA: Diagnosis not present

## 2024-05-08 NOTE — Progress Notes (Signed)
 Mild sleep apnea, corrected with CPAP therapy. Does not need BiPAP or oxygen  with CPAP use. Please place order for CPAP 12 cmH2O with EPR setting 2, small FFM and heated humidity. Schedule f/u in 10-12 weeks to review use. Thanks.

## 2024-05-08 NOTE — Procedures (Signed)
 [facility] [address] [place], [place] [address] Tel: [phone]   Fax: [phone]  Titration Interpretation  Patient Name:  [name], Felicia Study Date:  04/24/2024 Referring Physician:  KATHERINE COBB, NP  Indications for Polysomnography The patient is a 63 year-old Female who is 5\' 3"  and weighs 200.0 lbs. Her BMI equals 35.4.  A full night titration treatment study was performed.  01/27/2024 HST: AHI 11.2, SpO2 low 74%    Medication was administered at 1953  MELATONIN  CARABAMAZIPINE  PANTROPRAZOLE   Polysomnogram Data A full night polysomnogram recorded the standard physiologic parameters including EEG, EOG, EMG, EKG, nasal and oral airflow.  Respiratory parameters of chest and abdominal movements were recorded with Respiratory Inductance Plethysmography belts.  Oxygen  saturation was recorded by pulse oximetry.   Sleep Architecture The total recording time of the polysomnogram was 411.6 minutes.  The total sleep time was 390.0 minutes.  The patient spent 1.4% of total sleep time in Stage N1, 76.0% in Stage N2, 0.0% in Stages N3, and 22.6% in REM.  Sleep latency was 6.7 minutes.  REM latency was 61.0 minutes.  Sleep Efficiency was 94.7%.  Wake after Sleep Onset time was 14.5 minutes.  Titration Summary The patient was titrated at pressures ranging from 5* cm/H20 up to 12* cm/H20 .  The last pressure used in the study was 12* cm/H20   Respiratory Events The polysomnogram revealed a presence of - obstructive, 2 central, and 1 mixed apneas resulting in an Apnea index of 0.5 events per hour.  There were 14 hypopneas (>=3% desaturation and/or arousal) resulting in an Apnea\Hypopnea Index (AHI >=3% desaturation and/or arousal) of 2.6 events per hour.  There were 7 hypopneas (>=4% desaturation) resulting in an Apnea\Hypopnea Index (AHI >=4% desaturation) of 1.5 events per hour.  There were - Respiratory Effort Related Arousals resulting in a  RERA index of - events per hour. The Respiratory Disturbance Index is 2.6 events per hour.  The snore index was - events per hour.  Mean oxygen  saturation was 93.9%.  The lowest oxygen  saturation during sleep was 83.0%.  Time spent <=88% oxygen  saturation was 1.2 minutes (0.3%).  Limb Activity There were 29 limb movements recorded.  Of this total, 23 were classified as PLMs.  Of the PLMs, 13 were associated with arousals.  The Limb Movement index was 4.5 per hour while the PLM index was 3.5 per hour.  Cardiac Summary The average pulse rate was 74.1 bpm.  The minimum pulse rate was 58.0 bpm while the maximum pulse rate was 97.0 bpm.  Cardiac rhythm was normal/abnormal.  Diagnosis: Mild OSA corrected by CPAP 12 cm  Recommendations: CPAP 12 cm with EPR seting of 2 can be used A small-medium full face mask was tolerated well during this titration study   This study was personally reviewed and electronically signed by: Celene Coins, MD Accredited Board Certified in Sleep Medicine

## 2024-05-12 ENCOUNTER — Ambulatory Visit

## 2024-05-13 ENCOUNTER — Other Ambulatory Visit: Payer: Self-pay

## 2024-05-13 DIAGNOSIS — G4733 Obstructive sleep apnea (adult) (pediatric): Secondary | ICD-10-CM

## 2024-05-13 NOTE — Telephone Encounter (Signed)
 Called patient regarding sleep result. Did not answer, left a message to call back.

## 2024-05-13 NOTE — Progress Notes (Signed)
 amb

## 2024-05-21 ENCOUNTER — Other Ambulatory Visit: Payer: Self-pay | Admitting: Allergy & Immunology

## 2024-05-21 ENCOUNTER — Encounter: Payer: Self-pay | Admitting: Allergy & Immunology

## 2024-05-21 ENCOUNTER — Ambulatory Visit

## 2024-05-21 ENCOUNTER — Ambulatory Visit: Admitting: Allergy & Immunology

## 2024-05-21 ENCOUNTER — Other Ambulatory Visit: Payer: Self-pay

## 2024-05-21 VITALS — BP 132/84 | HR 94 | Temp 97.6°F | Wt 194.0 lb

## 2024-05-21 DIAGNOSIS — B999 Unspecified infectious disease: Secondary | ICD-10-CM

## 2024-05-21 DIAGNOSIS — J441 Chronic obstructive pulmonary disease with (acute) exacerbation: Secondary | ICD-10-CM

## 2024-05-21 DIAGNOSIS — K219 Gastro-esophageal reflux disease without esophagitis: Secondary | ICD-10-CM | POA: Diagnosis not present

## 2024-05-21 DIAGNOSIS — L508 Other urticaria: Secondary | ICD-10-CM

## 2024-05-21 DIAGNOSIS — J3089 Other allergic rhinitis: Secondary | ICD-10-CM

## 2024-05-21 NOTE — Patient Instructions (Addendum)
 1. Asthma-COPD overlap syndrome - Lung testing looks better today which is good news. - Daily controller medication(s): Breztri  two puffs twice daily + Ohtuvayre  nebulizer twice daily + Fasenra  every every 8 weeks (after 3 doses) + azithromycin  three times weekly until we see you again (Mon/Wed/Fri). - Prior to physical activity: Ventolin  (GRAY INHALER) 2 puffs 10-15 minutes before physical activity. - Rescue medications: albuterol  nebulizer one vial every 4-6 hours as needed OR Ventolin  (GRAY INHALER) 2 puffs AND 2 puffs Atrovent  (GREEN INHALER) every 4-6 hours as needed - Asthma control goals:  * Full participation in all desired activities (may need albuterol  before activity) * Albuterol  use two time or less a week on average (not counting use with activity) * Cough interfering with sleep two time or less a month * Oral steroids no more than once a year * No hospitalizations  2. Chronic urticaria - under good control - Continue with levocetirizine twice daily EVERY DAY.  - Continue with famotidine  twice daily EVERY DAY.  - Continue with Doxepin  25mg  at night to help with itching AS NEEDED.  - Continue with triamcinolone  twice daily as needed for your lesion.   3. 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 - Continue to hold the allergy  shots.   4. GERD - Continue famotidine  daily . - Continue omeprazole  daily.  5. Return in about 2 months (around 07/21/2024). You can have the follow up appointment with Dr. Idolina Maker or a Nurse Practicioner (our Nurse Practitioners are excellent and always have Physician oversight!).    Please inform us  of any Emergency Department visits, hospitalizations, or changes in symptoms. Call us  before going to the ED for breathing or allergy  symptoms since we might be able to fit you in for a sick visit. Feel free to contact us  anytime with any questions, problems, or concerns.  It was a  pleasure to see you again today!   Websites that have reliable patient information: 1. American Academy of Asthma, Allergy , and Immunology: www.aaaai.org 2. Food Allergy  Research and Education (FARE): foodallergy.org 3. Mothers of Asthmatics: http://www.asthmacommunitynetwork.org 4. [employer] of Allergy , Asthma, and Immunology: www.acaai.org      "Like" us  on Facebook and Instagram for our latest updates!      A healthy democracy works best when [employer] participate! Make sure you are registered to vote! If you have moved or changed any of your contact information, you will need to get this updated before voting! Scan the QR codes below to learn more!       Chest CT (April 2025):  Cardiovascular: Aortic atherosclerosis. Tortuous thoracic aorta. Normal heart size, without pericardial effusion.   Mediastinum/Nodes: No mediastinal or hilar adenopathy, given limitations of unenhanced CT.   Lungs/Pleura: No pleural fluid. Moderate centrilobular emphysema. Minimal motion degradation inferiorly. Isolated right upper lobe calcified granuloma.   Upper Abdomen: Previously questioned findings of cirrhosis are not appreciated today. Normal imaged portions of the spleen, stomach, adrenal glands, kidneys.   Musculoskeletal: Cervical spine fixation.   IMPRESSION: Lung-RADS 1, negative. Continue annual screening with low-dose chest CT without contrast in 12 months.

## 2024-05-21 NOTE — Progress Notes (Signed)
 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 Liver cirrhosis via chest CT (March 2024)    Perennial allergic rhinitis (dust mites) - on allergen immunotherapy   Chronic urticaria - controlled with antihistamines alone   Recurrent infections - with largely normal workup in 2020 and then lackluster Streptococcal titers in November 2023   Complicated past medical history, including disability as well as bipolar 1 depression and vitamin D  deficiency   Urinary urgency - improved following visit with Urology    Plan/Recommendations:   1. Asthma-COPD overlap syndrome - Lung testing looks better today which is good news. - Daily controller medication(s): Breztri  two puffs twice daily + Ohtuvayre  nebulizer twice daily + Fasenra  every every 8 weeks (after 3 doses) + azithromycin  three times weekly until we see you again (Mon/Wed/Fri). - Prior to physical activity: Ventolin  (GRAY INHALER) 2 puffs 10-15 minutes before physical activity. - Rescue medications: albuterol  nebulizer one vial every 4-6 hours as needed OR Ventolin  (GRAY INHALER) 2 puffs AND 2 puffs Atrovent  (GREEN INHALER) every 4-6 hours as needed - Asthma control goals:  * Full participation in all desired activities (may need albuterol  before activity) * Albuterol  use two time or less a week on average (not counting use with activity) * Cough interfering with sleep two time or less a month * Oral steroids no more than once a year * No hospitalizations  2. Chronic urticaria - under good control - Continue with levocetirizine twice  daily EVERY DAY.  - Continue with famotidine  twice daily EVERY DAY.  - Continue with Doxepin  25mg  at night to help with itching AS NEEDED.  - Continue with triamcinolone  twice daily as needed for your lesion.   3. 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 - Continue to hold the allergy  shots.   4. GERD - Continue famotidine  daily . - Continue omeprazole  daily.  5. Return in about 2 months (around 07/21/2024). You can have the follow up appointment with Dr. Idolina Maker or a Nurse Practicioner (our Nurse Practitioners are excellent and always have Physician oversight!).     Subjective:   [name] is a 63 y.o. female presenting today for follow up of  Chief Complaint  Patient presents with   Urticaria   Recurrent Skin Infections   Allergic Rhinitis    Asthma   Follow-up    She is doing better today    [name] has a history of the following: Patient Active Problem List   Diagnosis Date Noted   Exertional dyspnea 04/21/2024   Vaginal irritation 03/13/2024   Right ear pain 03/06/2024   Excessive daytime sleepiness 01/23/2024   Former smoker 01/23/2024   Upper airway resistance syndrome 01/23/2024   Wheezing 12/02/2023   Chronic cough 10/31/2023   Peri-rectal abscess 10/13/2023   Stridor 10/09/2023   Nocturia 09/17/2023   Steroid-induced psychosis, 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   Hypokalemia 08/12/2023   Hyperglycemia 08/12/2023   History of seizures 08/12/2023   Obesity (BMI 30-39.9) 08/12/2023   Transaminitis 08/12/2023  COVID-19 virus infection 08/07/2023   Peripheral edema 06/18/2023   Cirrhosis (HCC) 03/07/2023   Vocal cord dysfunction 03/05/2023   Urge urinary incontinence 01/25/2023   Influenza A 01/11/2023   Bilateral impacted cerumen 12/04/2022   Not well controlled severe persistent asthma 10/25/2022   Abnormal TSH 09/13/2022   Chronic sinusitis  08/14/2022   PND (paroxysmal nocturnal dyspnea) 06/07/2022   Vitamin D  deficiency 05/20/2022   Eczema 05/18/2022   Insomnia 05/18/2022   Allergic conjunctivitis 05/18/2022   Chronic respiratory failure with hypoxia and hypercapnia (HCC) 04/30/2022   Bilateral hearing loss 04/14/2022   Moderate persistent asthma without complication 02/02/2022   Low back pain 10/11/2021   Abnormal CXR 07/17/2021   Aortic atherosclerosis (HCC) 07/05/2021   URI (upper respiratory infection) 07/05/2021   Arthrodesis status 06/20/2021   Drug allergy  05/14/2021   Acute cough 05/14/2021   Bilateral leg weakness 04/17/2021   Status post cervical spinal fusion 12/27/2020   History of stroke 12/11/2020   Medication management 10/21/2020   Body mass index (BMI) 26.0-26.9, adult 08/23/2020   Healthcare maintenance 08/11/2020   Cervical spondylosis with myelopathy and radiculopathy 07/28/2020   Preop exam for internal medicine 06/19/2020   Hypotension due to medication 06/11/2020   Other spondylosis with myelopathy, cervical region 02/16/2020   Cervical spondylosis 02/02/2020   Radiculopathy, cervical region 02/02/2020   Spondylolysis, cervical region 02/02/2020   Neck pain 02/02/2020   Leg cramping 08/12/2019   Laryngopharyngeal reflux (LPR) 05/26/2019   Sinus pressure 05/26/2019   Hypophosphatemia 01/07/2019   Vertigo 09/23/2018   Dysfunction of left eustachian tube 09/23/2018   Gait disorder 04/21/2018   Leukocytosis 04/15/2018   Nausea & vomiting 04/15/2018   Seizure disorder (HCC)    Prolapsed cervical intervertebral disc 03/11/2018   Cervical radiculopathy 02/18/2018   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   Syncope 09/04/2017   Constipation 09/04/2017   Nonintractable headache 09/04/2017   Leg swelling 07/26/2017   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   Hyperlipidemia 04/20/2017   Pedal edema 04/19/2017   Bipolar disorder (HCC) 09/19/2016   Facial pain 07/12/2016   Rash 06/14/2016    Migraine 01/25/2016   Idiopathic urticaria/pruritus 01/23/2016   Allergic rhinitis 01/23/2016   Oral candidiasis 01/23/2016   Greater trochanteric bursitis of both hips 09/08/2015   Chest pain 06/22/2015   Itching 06/22/2015   Bilateral knee pain 06/22/2015   Bilateral hip pain 06/22/2015   Chronic iron  deficiency anemia 12/22/2014   Fatigue 12/21/2014   Rash and nonspecific skin eruption 08/24/2014   Intertrigo 08/07/2014   Dyspareunia in female 07/12/2014   Menopausal syndrome (hot flushes) 07/12/2014   History of tobacco abuse 07/12/2014   Dizziness 06/22/2014   Weakness 06/22/2014   Fibromyalgia 05/14/2014   Encounter for well adult exam with abnormal findings 04/22/2014   Recurrent boils 04/22/2014   Recurrent falls 04/22/2014   Peripheral vascular disease (HCC)    Depression    Anxiety    GERD without esophagitis    Prediabetes 02/23/2014   COPD exacerbation (HCC) 02/23/2014   Encounter for screening mammogram for high-risk patient 02/23/2014   Back pain 07/22/2013   COPD/asthma 08/24/2009   Headache(784.0) 08/24/2009   HTN (hypertension) 05/12/2009   Asthma-COPD overlap syndrome (HCC) 05/12/2009    History obtained from: chart review and patient.  Discussed the use of AI scribe software for clinical note transcription with the patient and/or guardian, who gave verbal consent to proceed.  Bryson is a 62 y.o. female presenting for a follow up visit.  She was last seen in April 2025.  At that time, lung testing was not done.  We started her on a prednisone  taper.  We also started azithromycin  3 times weekly.  She felt that a lot of her symptoms were related to her living situation, so I wanted to see how she was doing with that before proceeding with any changes.  We continue with Breztri  2 puffs twice daily as well as Ohtuvayre  twice daily.  She also was on Fasenra  every 8 weeks.  For her urticaria, this was under good control with levocetirizine and famotidine  twice  daily.  We also continue with triamcinolone  twice daily.  For her mixed rhinitis, we will continue with the Flonase .  She was on allergy  shots, but we have been holding those for quite some time due to her clinical situation.  For her GERD, we continue with famotidine  and omeprazole .  Since last visit, she has done well.  She does feel better today and this is reflected in her improved spirometry.  Asthma/Respiratory Symptom History: She continues to use Breztri  and a new nebulizer medication, Ohtuvayre , twice daily. She receives her medications by mail. She uses another nebulizer only when experiencing wheezing. She is also on azithromycin  three times a week (Monday, Wednesday, Friday) and receives Fasenra  injections, with the last dose administered in February. Noriah's asthma has been well controlled. She has not required rescue medication, experienced nocturnal awakenings due to lower respiratory symptoms, nor have activities of daily living been limited. She has required no Emergency Department or Urgent Care visits for her asthma. She has required zero courses of systemic steroids for asthma exacerbations since the last visit. ACT score today is 16, indicating subpar asthma symptom control.   She has sleep apnea and finds it difficult to keep the CPAP mask on during sleep.  Allergic Rhinitis Symptom History: She uses a nasal spray regularly and reports swelling in one eye, for which she uses eye drops. She also has nasal issues, possibly related to a polyp, and experiences swelling around her eyes.  Skin Symptom History: She experiences occasional itching, but some days are free of symptoms.   GERD Symptom History: No recent issues with reflux. Her reflux is well-controlled with no recent issues.   She enjoys staying home and playing games. Her daughter, who is a Manufacturing systems engineer, does her hair extensions. 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 She has been staying  home to avoid exposure to smoke from neighbors who smoke heavily. She wears a mask when going outside, such as when checking the mail with her grandson.  Otherwise, there have been no changes to her past medical history, surgical history, family history, or social history.    Review of systems otherwise negative other than that mentioned in the HPI.    Objective:   Blood pressure 132/84, pulse 94, temperature 97.6 F (36.4 C), temperature source Temporal, weight 194 lb (88 kg), SpO2 97%. Body mass index is 34.37 kg/m.    Physical Exam Vitals reviewed.  Constitutional:      Appearance: Normal appearance. She is well-developed.     Comments: Talkative. Talking in full sentences.  HENT:     Head: Normocephalic and atraumatic.     Right Ear: Tympanic membrane, ear canal and external ear normal.     Left Ear: Tympanic membrane, ear canal and external ear normal.     Nose: No nasal deformity, septal deviation, mucosal edema, congestion or rhinorrhea.     Right Turbinates:  Enlarged, swollen and pale.     Left Turbinates: Enlarged, swollen and pale.     Right Sinus: No maxillary sinus tenderness or frontal sinus tenderness.     Left Sinus: No maxillary sinus tenderness or frontal sinus tenderness.     Comments: Left sided polyp noted.    Mouth/Throat:     Lips: Pink.     Mouth: Mucous membranes are moist. Mucous membranes are not pale and not dry.     Pharynx: Uvula midline.     Comments: Cobblestoning present in the posterior oropharynx.  Eyes:     General: Lids are normal. Allergic shiner present.        Right eye: No discharge.        Left eye: No discharge.     Conjunctiva/sclera: Conjunctivae normal.     Right eye: Right conjunctiva is not injected. No chemosis.    Left eye: Left conjunctiva is not injected. No chemosis.    Pupils: Pupils are equal, round, and reactive to light.  Cardiovascular:     Rate and Rhythm: Normal rate and regular rhythm.     Heart sounds:  Normal heart sounds.  Pulmonary:     Effort: Pulmonary effort is normal. No tachypnea, accessory muscle usage, prolonged expiration or respiratory distress.     Breath sounds: Normal breath sounds. Decreased air movement present. No stridor. No wheezing, rhonchi or rales.     Comments: Decreased  air movement at the bases.  Chest:     Chest wall: No tenderness.  Lymphadenopathy:     Cervical: No cervical adenopathy.  Skin:    General: Skin is warm.     Capillary Refill: Capillary refill takes less than 2 seconds.     Coloration: Skin is not pale.     Findings: No abrasion, erythema, petechiae or rash. Rash is not papular, urticarial or vesicular.     Comments: Skin looks clear.   Neurological:     Mental Status: She is alert.  Psychiatric:        Behavior: Behavior is cooperative.      Diagnostic studies:    Spirometry: results abnormal (FEV1: 0.80/39%, FVC: 1.92/74%, FEV1/FVC: 42%).    Spirometry consistent with moderate obstructive disease. This is much better compared to the last time we saw her in the clinic.   Allergy  Studies: none       Drexel Gentles, MD  [facility] 

## 2024-05-25 ENCOUNTER — Inpatient Hospital Stay (HOSPITAL_BASED_OUTPATIENT_CLINIC_OR_DEPARTMENT_OTHER): Admitting: Hematology and Oncology

## 2024-05-25 ENCOUNTER — Inpatient Hospital Stay: Attending: Hematology and Oncology

## 2024-05-25 ENCOUNTER — Encounter: Payer: Self-pay | Admitting: Hematology and Oncology

## 2024-05-25 VITALS — BP 116/84 | HR 98 | Temp 98.8°F | Resp 16 | Ht 63.0 in | Wt 191.4 lb

## 2024-05-25 DIAGNOSIS — Z79899 Other long term (current) drug therapy: Secondary | ICD-10-CM | POA: Diagnosis not present

## 2024-05-25 DIAGNOSIS — D72829 Elevated white blood cell count, unspecified: Secondary | ICD-10-CM | POA: Diagnosis not present

## 2024-05-25 DIAGNOSIS — D75839 Thrombocytosis, unspecified: Secondary | ICD-10-CM | POA: Insufficient documentation

## 2024-05-25 DIAGNOSIS — D539 Nutritional anemia, unspecified: Secondary | ICD-10-CM | POA: Insufficient documentation

## 2024-05-25 DIAGNOSIS — Z87891 Personal history of nicotine dependence: Secondary | ICD-10-CM | POA: Insufficient documentation

## 2024-05-25 DIAGNOSIS — D649 Anemia, unspecified: Secondary | ICD-10-CM | POA: Insufficient documentation

## 2024-05-25 LAB — CMP (CANCER CENTER ONLY)
ALT: 15 U/L (ref 0–44)
AST: 15 U/L (ref 15–41)
Albumin: 4.3 g/dL (ref 3.5–5.0)
Alkaline Phosphatase: 110 U/L (ref 38–126)
Anion gap: 8 (ref 5–15)
BUN: 18 mg/dL (ref 8–23)
CO2: 32 mmol/L (ref 22–32)
Calcium: 9.3 mg/dL (ref 8.9–10.3)
Chloride: 103 mmol/L (ref 98–111)
Creatinine: 0.91 mg/dL (ref 0.44–1.00)
GFR, Estimated: >60 mL/min (ref >60)
Glucose, Bld: 99 mg/dL (ref 70–99)
Potassium: 4 mmol/L (ref 3.5–5.1)
Sodium: 143 mmol/L (ref 135–145)
Total Bilirubin: 0.3 mg/dL (ref 0.0–1.2)
Total Protein: 7 g/dL (ref 6.5–8.1)

## 2024-05-25 LAB — CBC WITH DIFFERENTIAL (CANCER CENTER ONLY)
Abs Immature Granulocytes: 0.05 10*3/uL (ref 0.00–0.07)
Basophils Absolute: 0 10*3/uL (ref 0.0–0.1)
Basophils Relative: 0 %
Eosinophils Absolute: 0 10*3/uL (ref 0.0–0.5)
Eosinophils Relative: 0 %
HCT: 32.1 % — ABNORMAL LOW (ref 36.0–46.0)
Hemoglobin: 10 g/dL — ABNORMAL LOW (ref 12.0–15.0)
Immature Granulocytes: 1 %
Lymphocytes Relative: 20 %
Lymphs Abs: 2 10*3/uL (ref 0.7–4.0)
MCH: 26.6 pg (ref 26.0–34.0)
MCHC: 31.2 g/dL (ref 30.0–36.0)
MCV: 85.4 fL (ref 80.0–100.0)
Monocytes Absolute: 0.7 10*3/uL (ref 0.1–1.0)
Monocytes Relative: 7 %
Neutro Abs: 7.3 10*3/uL (ref 1.7–7.7)
Neutrophils Relative %: 72 %
Platelet Count: 386 10*3/uL (ref 150–400)
RBC: 3.76 MIL/uL — ABNORMAL LOW (ref 3.87–5.11)
RDW: 15.8 % — ABNORMAL HIGH (ref 11.5–15.5)
WBC Count: 10.1 10*3/uL (ref 4.0–10.5)
nRBC: 0 % (ref 0.0–0.2)

## 2024-05-25 LAB — FERRITIN: Ferritin: 25 ng/mL (ref 11–307)

## 2024-05-25 LAB — RETICULOCYTES
Immature Retic Fract: 26.7 % — ABNORMAL HIGH (ref 2.3–15.9)
RBC.: 3.7 MIL/uL — ABNORMAL LOW (ref 3.87–5.11)
Retic Count, Absolute: 69.2 10*3/uL (ref 19.0–186.0)
Retic Ct Pct: 1.9 % (ref 0.4–3.1)

## 2024-05-25 LAB — IRON AND IRON BINDING CAPACITY (CC-WL,HP ONLY)
Iron: 29 ug/dL (ref 28–170)
Saturation Ratios: 8 % — ABNORMAL LOW (ref 10.4–31.8)
TIBC: 385 ug/dL (ref 250–450)
UIBC: 356 ug/dL (ref 148–442)

## 2024-05-25 LAB — SEDIMENTATION RATE: Sed Rate: 23 mm/h — ABNORMAL HIGH (ref 0–22)

## 2024-05-25 LAB — VITAMIN B12: Vitamin B-12: 271 pg/mL (ref 180–914)

## 2024-05-25 NOTE — Assessment & Plan Note (Signed)
 She has multifactorial anemia and has been on oral iron  supplement for some time I will order repeat iron  studies for review
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
## 2024-05-25 NOTE — Assessment & Plan Note (Signed)
 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

## 2024-05-25 NOTE — Assessment & Plan Note (Signed)
 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

## 2024-05-26 ENCOUNTER — Telehealth: Payer: Self-pay | Admitting: Hematology and Oncology

## 2024-05-26 NOTE — Telephone Encounter (Signed)
 I called the patient and discussed test results.  She was referred to me for thrombocytosis.  By the time I repeated all her labs, thrombocytosis has resolved.  She is found to have iron  deficiency and that could contribute to reactive thrombocytosis.  She has been taking all her oral iron  supplement with food.  I recommend she takes oral iron  supplement with an empty stomach if she can tolerate it and have her primary care doctor recheck iron  studies in a few months.  We did talk about risk and benefits of intravenous iron  infusion but the patient declined IV iron  for now.  She does not need long-term follow-up with me.  I addressed all her questions

## 2024-05-28 ENCOUNTER — Ambulatory Visit (HOSPITAL_COMMUNITY)
Admission: RE | Admit: 2024-05-28 | Discharge: 2024-05-28 | Disposition: A | Discharge status: Home / Self Care | Source: Ambulatory Visit | Attending: Cardiology | Admitting: Cardiology

## 2024-05-28 DIAGNOSIS — R0609 Other forms of dyspnea: Secondary | ICD-10-CM

## 2024-05-28 LAB — ECHOCARDIOGRAM COMPLETE
Area-P 1/2: 3.72 cm2
S' Lateral: 3.2 cm

## 2024-05-29 ENCOUNTER — Ambulatory Visit: Payer: Self-pay | Admitting: Cardiology

## 2024-06-01 ENCOUNTER — Encounter (HOSPITAL_COMMUNITY): Payer: Self-pay | Admitting: Internal Medicine

## 2024-06-01 ENCOUNTER — Encounter (HOSPITAL_COMMUNITY): Payer: Self-pay | Admitting: *Deleted

## 2024-06-01 ENCOUNTER — Telehealth (HOSPITAL_COMMUNITY): Payer: Self-pay | Admitting: *Deleted

## 2024-06-01 NOTE — Telephone Encounter (Signed)
 I am so sorry to hear that. [name] is only approved for moderate to severe sleep apnea. Mild sleep apnea poses little to no cardiovascular risks so that's why the surgery isn't approved for management in mild OSA, as of now.   I would recommend doing a nasal mask like the AirTouch N20i. These just go under the nose, have a smaller headstrap and a soft lining. I've had a lot of patients with PTSD and claustrophobia do okay on this. Thanks.

## 2024-06-01 NOTE — Telephone Encounter (Signed)
 Letter sent with instructions for upcoming stress test.  Argentina Bees, RN

## 2024-06-07 ENCOUNTER — Other Ambulatory Visit: Payer: Self-pay | Admitting: Internal Medicine

## 2024-06-08 ENCOUNTER — Other Ambulatory Visit: Payer: Self-pay | Admitting: Internal Medicine

## 2024-06-08 ENCOUNTER — Other Ambulatory Visit: Payer: Self-pay

## 2024-06-08 DIAGNOSIS — R079 Chest pain, unspecified: Secondary | ICD-10-CM

## 2024-06-10 ENCOUNTER — Ambulatory Visit (HOSPITAL_COMMUNITY)
Admission: RE | Admit: 2024-06-10 | Discharge: 2024-06-10 | Disposition: A | Discharge status: Home / Self Care | Source: Ambulatory Visit | Attending: Cardiology | Admitting: Cardiology

## 2024-06-10 ENCOUNTER — Ambulatory Visit: Payer: Self-pay | Admitting: Internal Medicine

## 2024-06-10 DIAGNOSIS — R079 Chest pain, unspecified: Secondary | ICD-10-CM | POA: Insufficient documentation

## 2024-06-10 LAB — MYOCARDIAL PERFUSION IMAGING
LV dias vol: 99 mL (ref 46–106)
LV sys vol: 44 mL (ref 3.8–5.2)
Nuc Stress EF: 52 %
Peak HR: 123 {beats}/min
Rest HR: 75 {beats}/min
Rest Nuclear Isotope Dose: 11 mCi
SDS: 1
SRS: 2
SSS: 1
ST Depression (mm): 0 mm
Stress Nuclear Isotope Dose: 32.7 mCi
TID: 0.79

## 2024-06-10 MED ORDER — REGADENOSON 0.4 MG/5ML IV SOLN
0.4000 mg | Freq: Once | INTRAVENOUS | Status: AC
  Administered 2024-06-10: 0.4 mg via INTRAVENOUS

## 2024-06-10 MED ORDER — TECHNETIUM TC 99M TETROFOSMIN IV KIT
11.0000 | PACK | Freq: Once | INTRAVENOUS | Status: AC | PRN
  Administered 2024-06-10: 11 via INTRAVENOUS

## 2024-06-10 MED ORDER — REGADENOSON 0.4 MG/5ML IV SOLN
INTRAVENOUS | Status: AC
  Filled 2024-06-10: qty 5

## 2024-06-10 MED ORDER — TECHNETIUM TC 99M TETROFOSMIN IV KIT
32.7000 | PACK | Freq: Once | INTRAVENOUS | Status: AC | PRN
  Administered 2024-06-10: 32.7 via INTRAVENOUS

## 2024-06-12 DIAGNOSIS — L309 Dermatitis, unspecified: Secondary | ICD-10-CM | POA: Diagnosis not present

## 2024-06-12 DIAGNOSIS — I110 Hypertensive heart disease with heart failure: Secondary | ICD-10-CM | POA: Diagnosis not present

## 2024-06-12 DIAGNOSIS — M4800 Spinal stenosis, site unspecified: Secondary | ICD-10-CM | POA: Diagnosis not present

## 2024-06-12 DIAGNOSIS — I2510 Atherosclerotic heart disease of native coronary artery without angina pectoris: Secondary | ICD-10-CM | POA: Diagnosis not present

## 2024-06-12 DIAGNOSIS — I509 Heart failure, unspecified: Secondary | ICD-10-CM | POA: Diagnosis not present

## 2024-06-12 DIAGNOSIS — J4550 Severe persistent asthma, uncomplicated: Secondary | ICD-10-CM | POA: Diagnosis not present

## 2024-06-12 DIAGNOSIS — J439 Emphysema, unspecified: Secondary | ICD-10-CM | POA: Diagnosis not present

## 2024-06-12 DIAGNOSIS — G8220 Paraplegia, unspecified: Secondary | ICD-10-CM | POA: Diagnosis not present

## 2024-06-12 DIAGNOSIS — F319 Bipolar disorder, unspecified: Secondary | ICD-10-CM | POA: Diagnosis not present

## 2024-06-12 DIAGNOSIS — K219 Gastro-esophageal reflux disease without esophagitis: Secondary | ICD-10-CM | POA: Diagnosis not present

## 2024-06-12 DIAGNOSIS — Z79899 Other long term (current) drug therapy: Secondary | ICD-10-CM | POA: Diagnosis not present

## 2024-06-12 DIAGNOSIS — M545 Low back pain, unspecified: Secondary | ICD-10-CM | POA: Diagnosis not present

## 2024-06-12 DIAGNOSIS — N3941 Urge incontinence: Secondary | ICD-10-CM | POA: Diagnosis not present

## 2024-06-12 DIAGNOSIS — I700 Atherosclerosis of aorta: Secondary | ICD-10-CM | POA: Diagnosis not present

## 2024-06-12 DIAGNOSIS — I69365 Other paralytic syndrome following cerebral infarction, bilateral: Secondary | ICD-10-CM | POA: Diagnosis not present

## 2024-06-12 DIAGNOSIS — G40909 Epilepsy, unspecified, not intractable, without status epilepticus: Secondary | ICD-10-CM | POA: Diagnosis not present

## 2024-06-12 DIAGNOSIS — F419 Anxiety disorder, unspecified: Secondary | ICD-10-CM | POA: Diagnosis not present

## 2024-06-17 ENCOUNTER — Ambulatory Visit

## 2024-06-22 ENCOUNTER — Encounter: Payer: Self-pay | Admitting: Internal Medicine

## 2024-06-23 ENCOUNTER — Encounter: Payer: Self-pay | Admitting: Internal Medicine

## 2024-06-23 ENCOUNTER — Ambulatory Visit (INDEPENDENT_AMBULATORY_CARE_PROVIDER_SITE_OTHER): Payer: Medicare HMO | Admitting: Internal Medicine

## 2024-06-23 VITALS — BP 112/72 | HR 84 | Temp 98.4°F | Ht 63.0 in | Wt 190.0 lb

## 2024-06-23 DIAGNOSIS — I10 Essential (primary) hypertension: Secondary | ICD-10-CM | POA: Diagnosis not present

## 2024-06-23 DIAGNOSIS — J441 Chronic obstructive pulmonary disease with (acute) exacerbation: Secondary | ICD-10-CM | POA: Diagnosis not present

## 2024-06-23 DIAGNOSIS — E559 Vitamin D deficiency, unspecified: Secondary | ICD-10-CM

## 2024-06-23 DIAGNOSIS — L299 Pruritus, unspecified: Secondary | ICD-10-CM | POA: Diagnosis not present

## 2024-06-23 MED ORDER — METHYLPREDNISOLONE ACETATE 40 MG/ML IJ SUSP
40.0000 mg | Freq: Once | INTRAMUSCULAR | Status: AC
  Administered 2024-06-23: 40 mg via INTRAMUSCULAR

## 2024-06-23 NOTE — Assessment & Plan Note (Signed)
 BP Readings from Last 3 Encounters:  06/23/24 112/72  05/25/24 116/84  05/21/24 132/84   Stable, pt to continue medical treatment norvasc  5 every day, avapro  150 qd

## 2024-06-23 NOTE — Assessment & Plan Note (Signed)
 Last vitamin D Lab Results  Component Value Date   VD25OH 36.15 12/25/2023   Low, to start  oral replacement

## 2024-06-23 NOTE — Assessment & Plan Note (Signed)
 Etiology unclear, no jaundice, likely allergic related though no rash or swelling - for depomedrol IM 80 mg, continue antihistamine otc prn such as claritin  10 mg every day prn

## 2024-06-23 NOTE — Patient Instructions (Addendum)
 You are given the Jury Duty note today  You had the steroid shot today  Please continue all other medications as before, and refills have been done if requested.  Please have the pharmacy call with any other refills you may need.  Please continue your efforts at being more active, low cholesterol diet, and weight control  Please keep your appointments with your specialists as you may have planned  Please make an Appointment to return in 6 months, or sooner if needed

## 2024-06-23 NOTE — Assessment & Plan Note (Signed)
 Currently stable, continue inhaler asd

## 2024-06-23 NOTE — Progress Notes (Signed)
 Patient ID: [name], female   DOB: 06/21/1961, 63 y.o.   MRN: [id number]        Chief Complaint: follow up itching, asthma/copd, low vit d , htn       HPI:  [name] is a 63 y.o. female here with c/o all over itching without specific rash or swelling, no tongue swelling and Pt denies chest pain, increased sob or doe, wheezing, orthopnea, PND, increased LE swelling, palpitations, dizziness or syncope.   Pt denies polydipsia, polyuria, or new focal neuro s/s.          Wt Readings from Last 3 Encounters:  06/23/24 190 lb (86.2 kg)  06/10/24 191 lb (86.6 kg)  05/25/24 191 lb 6 oz (86.8 kg)   BP Readings from Last 3 Encounters:  06/23/24 112/72  05/25/24 116/84  05/21/24 132/84         Past Medical History:  Diagnosis Date   Anemia, iron  deficiency 12/22/2014   pt. 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      Bipolar disorder (HCC)    with anxiety, depression   COPD (chronic obstructive pulmonary disease) (HCC)    Eczema 05/18/2022   Fibromyalgia 05/14/2014   GERD (gastroesophageal reflux disease)    History of kidney stones    Hyperlipidemia 04/20/2017   HYPERTENSION 05/12/2009   Qualifier: Diagnosis of  By: Primus LATHER LEODIS), [name]     Peripheral vascular disease (HCC)    Pneumonia    Prediabetes 02/23/2014   pt. denies   Seizure (HCC)    Stroke (HCC) 11/2020   Urticaria    Vitamin D  deficiency    Past Surgical History:  Procedure Laterality Date   ABDOMINAL HYSTERECTOMY     ANTERIOR CERVICAL DECOMP/DISCECTOMY FUSION N/A 07/28/2020   Procedure: ANTERIOR CERVICAL DECOMPRESSION/DISCECTOMY FUSION. INTERBODY PROTHESIS, PLATE/SCREWS CERVICAL THREE- CERVICAL FOUR, CERVICAL FOUR- CERVICAL FIVE;  Surgeon: Mavis Purchase, MD;  Location: [facility] OR;  Service:  Neurosurgery;  Laterality: N/A;   BACK SURGERY     COLONOSCOPY  12/20/2011   Procedure: COLONOSCOPY;  Surgeon: Elsie CHRISTELLA Cree, MD;  Location: [facility] ENDOSCOPY;  Service: Endoscopy;  Laterality: N/A;   COLONOSCOPY  03/05/2012   Procedure: COLONOSCOPY;  Surgeon: Elsie CHRISTELLA Cree, MD;  Location: [facility];  Service: Endoscopy;  Laterality: N/A;   DIAGNOSTIC LAPAROSCOPY     ESOPHAGOGASTRODUODENOSCOPY (EGD) WITH PROPOFOL  N/A 10/31/2023   Procedure: ESOPHAGOGASTRODUODENOSCOPY (EGD) WITH PROPOFOL ;  Surgeon: Legrand Victory LITTIE DOUGLAS, MD;  Location[facility];  Service: Gastroenterology;  Laterality: N/A;   HEMORRHOID SURGERY     INCISE AND DRAIN ABCESS     KIDNEY STONE SURGERY     NECK SURGERY     x 2 Dr JINNY Mavis   SPINE SURGERY  2019   TOE SURGERY     TUBAL LIGATION      reports that she quit smoking about 6 years ago. Her smoking use included cigarettes. She started smoking about 46 years ago. She has a 20 pack-year smoking history. She has never been exposed to tobacco smoke. She has never used smokeless tobacco. She reports that she does not currently use alcohol . She reports that she does not currently use drugs after having used the following drugs: Marijuana. family history includes Alcohol  abuse in her brother and father; Anemia in her daughter,  sister, and sister; Anesthesia problems in her father; Asthma in her brother, mother, and sister; Brain cancer in her sister; Breast cancer (age of onset: 41) in her sister; COPD in her mother and sister; Colon cancer (age of onset: 68) in her father; Diabetes in her brother, father, mother, sister, sister, sister, and sister; Heart disease in her brother, brother, mother, and sister; Hypertension in her brother, daughter, sister, sister, sister, sister, and sister; Kidney disease in her father; Sleep apnea in her brother. Allergies  Allergen Reactions   Sulfa Antibiotics Nausea And Vomiting and Other (See Comments)   Venofer  [Iron  Sucrose] Hives    Current Outpatient Medications on File Prior to Visit  Medication Sig Dispense Refill   AIRSUPRA  90-80 MCG/ACT AERO SMARTSIG:2 Puff(s) Via Inhaler 4 Times Daily PRN     albuterol  (PROVENTIL ) (2.5 MG/3ML) 0.083% nebulizer solution USE 1 VIAL VIA NEBULIZER EVERY 4 HOURS AS NEEDED FOR WHEEZING OR SHORTNESS OF BREATH 75 mL 1   albuterol  (VENTOLIN  HFA) 108 (90 Base) MCG/ACT inhaler INHALE 2 PUFFS INTO THE LUNGS EVERY 6 HOURS AS NEEDED FOR WHEEZING OR SHORTNESS OF BREATH 18 g 1   amLODipine  (NORVASC ) 5 MG tablet Take 1 tablet (5 mg total) by mouth daily. 90 tablet 3   ARIPiprazole  (ABILIFY ) 10 MG tablet Take 1 tablet (10 mg total) by mouth daily. Take Abilify  20 mg po daily for 2 weeks till 9/17 , then continue taking 10 mg po daily 30 tablet 1   atorvastatin  (LIPITOR) 40 MG tablet Take 1 tablet (40 mg total) by mouth daily. TAKE 1 TABLET(40 MG) BY MOUTH DAILY Strength: 40 mg 90 tablet 3   azelastine  (OPTIVAR ) 0.05 % ophthalmic solution Place 1 drop into both eyes 2 (two) times daily. 6 mL 12   benralizumab  (FASENRA  PEN) 30 MG/ML prefilled autoinjector Inject 1 mL (30 mg total) into the skin every 28 (twenty-eight) days. For 3 doses then every 8 weeks 1 mL 9   Budeson-Glycopyrrol-Formoterol  (BREZTRI  AEROSPHERE) 160-9-4.8 MCG/ACT AERO Inhale 2 puffs into the lungs in the morning and at bedtime. 10.7 g 11   budesonide  (PULMICORT ) 0.5 MG/2ML nebulizer solution Take 2 mLs (0.5 mg total) by nebulization in the morning and at bedtime. 120 mL 2   carbamazepine  (TEGRETOL ) 200 MG tablet Take 2-3 tablets (400-600 mg total) by mouth See admin instructions. Take 400mg  in the morning and 600mg  at bedtime. 150 tablet 2   cetirizine  (ZYRTEC ) 10 MG tablet Can take one tablet by mouth once daily if needed for runny nose. 30 tablet 5   cromolyn  (OPTICROM ) 4 % ophthalmic solution Place 1 drop into both eyes 4 (four) times daily. 10 mL 12   doxepin  (SINEQUAN ) 25 MG capsule TAKE 1 CAPSULE(25 MG) BY MOUTH AT BEDTIME AS  NEEDED 30 capsule 5   Ensifentrine  (OHTUVAYRE ) 3 MG/2.5ML SUSP Inhale 3 mg into the lungs in the morning and at bedtime. 150 mL 11   famotidine  (PEPCID ) 40 MG tablet Take 1 tablet (40 mg total) by mouth 2 (two) times daily. 60 tablet 5   ferrous sulfate  325 (65 FE) MG EC tablet TAKE 1 TABLET(325 MG) BY MOUTH DAILY WITH BREAKFAST 90 tablet 1   fluticasone  (FLONASE ) 50 MCG/ACT nasal spray SHAKE LIQUID AND USE 1 SPRAY IN EACH NOSTRIL TWICE DAILY AS NEEDED FOR ALLERGIES OR RHINITIS 16 g 5   formoterol  (PERFOROMIST ) 20 MCG/2ML nebulizer solution Take 20 mcg by nebulization 2 (two) times daily.     furosemide  (LASIX ) 20 MG tablet TAKE 1  TABLET BY MOUTH DAILY AS NEEDED 30 tablet 5   hydrOXYzine  (VISTARIL ) 25 MG capsule Take one tablet every 6-8 hours as needed for itching. 30 capsule 1   ipratropium (ATROVENT  HFA) 17 MCG/ACT inhaler Inhale 2 puffs into the lungs every 4 (four) hours as needed for wheezing. 1 each 5   irbesartan  (AVAPRO ) 150 MG tablet Take 1 tablet (150 mg total) by mouth daily. 90 tablet 3   levocetirizine (XYZAL ) 5 MG tablet Take 1 tablet (5 mg total) by mouth in the morning and at bedtime. 60 tablet 5   montelukast  (SINGULAIR ) 10 MG tablet TAKE 1 TABLET(10 MG) BY MOUTH AT BEDTIME 90 tablet 1   Nebulizers (PARI PRONEB MAX LC SPRINT) MISC      omeprazole  (PRILOSEC) 40 MG capsule Take 1 capsule (40 mg total) by mouth daily. 90 capsule 3   ondansetron  (ZOFRAN -ODT) 4 MG disintegrating tablet Take 1 tablet (4 mg total) by mouth every 8 (eight) hours as needed for nausea or vomiting. 30 tablet 1   Skin Protectants, Misc. (EUCERIN) cream Apply 1 Application topically daily as needed for dry skin.     tacrolimus  (PROTOPIC ) 0.03 % ointment Apply 1 Application topically 2 (two) times daily as needed (rash). (Patient taking differently: Apply 1 Application topically 2 (two) times daily as needed (recurrent rash).) 60 g 5   triamcinolone  ointment (KENALOG ) 0.1 % Apply 1 Application topically at  bedtime. 30 g 5   Vibegron  (GEMTESA ) 75 MG TABS Take 1 tablet (75 mg total) by mouth daily. 30 tablet 11   azithromycin  (ZITHROMAX ) 500 MG tablet Take 1 tablet (500 mg total) by mouth every Monday, Wednesday, and Friday. (Patient not taking: Reported on 06/23/2024) 36 tablet 0   estradiol  (ESTRACE ) 0.1 MG/GM vaginal cream Place 1g nightly 2-3 times a week (Patient not taking: Reported on 06/23/2024) 30 g 3   Current Facility-Administered Medications on File Prior to Visit  Medication Dose Route Frequency Provider Last Rate Last Admin   benralizumab  (FASENRA ) prefilled syringe 30 mg  30 mg Subcutaneous Q8 Weeks Gallagher, Joel Louis, MD   30 mg at 05/21/24 1138        ROS:  All others reviewed and negative.  Objective        PE:  BP 112/72   Pulse 84   Temp 98.4 F (36.9 C)   Ht 5' 3 (1.6 m)   Wt 190 lb (86.2 kg)   SpO2 96%   BMI 33.66 kg/m                 Constitutional: Pt appears in NAD               HENT: Head: NCAT.                Right Ear: External ear normal.                 Left Ear: External ear normal.                Eyes: . Pupils are equal, round, and reactive to light. Conjunctivae and EOM are normal               Nose: without d/c or deformity               Neck: Neck supple. Gross normal ROM               Cardiovascular: Normal rate and regular rhythm.  Pulmonary/Chest: Effort normal and breath sounds without rales or wheezing.                Abd:  Soft, NT, ND, + BS, no organomegaly               Neurological: Pt is alert. At baseline orientation, motor grossly intact               Skin: Skin is warm. No rashes, no other new lesions, LE edema - none               Psychiatric: Pt behavior is normal without agitation   Micro: none  Cardiac tracings I have personally interpreted today:  none  Pertinent Radiological findings (summarize): none   Lab Results  Component Value Date   WBC 10.1 05/25/2024   HGB 10.0 (L) 05/25/2024   HCT 32.1 (L)  05/25/2024   PLT 386 05/25/2024   GLUCOSE 99 05/25/2024   CHOL 171 12/25/2023   TRIG 64.0 12/25/2023   HDL 77.50 12/25/2023   LDLCALC 80 12/25/2023   ALT 15 05/25/2024   AST 15 05/25/2024   NA 143 05/25/2024   K 4.0 05/25/2024   CL 103 05/25/2024   CREATININE 0.91 05/25/2024   BUN 18 05/25/2024   CO2 32 05/25/2024   TSH 0.53 12/25/2023   INR 0.9 03/14/2023   HGBA1C 6.0 12/25/2023   Assessment/Plan:  [name] is a 63 y.o. Black or African American [2] female with  has a past medical history of Anemia, iron  deficiency (12/22/2014), Arthritis, ASTHMA (05/12/2009), Bipolar disorder (HCC), COPD (chronic obstructive pulmonary disease) (HCC), Eczema (05/18/2022), Fibromyalgia (05/14/2014), GERD (gastroesophageal reflux disease), History of kidney stones, Hyperlipidemia (04/20/2017), HYPERTENSION (05/12/2009), Peripheral vascular disease (HCC), Pneumonia, Prediabetes (02/23/2014), Seizure (HCC), Stroke (HCC) (11/2020), Urticaria, and Vitamin D  deficiency.  COPD/asthma Currently stable, continue inhaler asd  Vitamin D  deficiency Last vitamin D  Lab Results  Component Value Date   VD25OH 36.15 12/25/2023   Low, to start oral replacement   Itching Etiology unclear, no jaundice, likely allergic related though no rash or swelling - for depomedrol IM 80 mg, continue antihistamine otc prn such as claritin  10 mg every day prn  HTN (hypertension) BP Readings from Last 3 Encounters:  06/23/24 112/72  05/25/24 116/84  05/21/24 132/84   Stable, pt to continue medical treatment norvasc  5 every day, avapro  150 qd  Followup: Return in about 6 months (around 12/24/2024).  Lynwood Rush, MD 06/23/2024 8:34 PM [facility]

## 2024-06-24 DIAGNOSIS — R942 Abnormal results of pulmonary function studies: Secondary | ICD-10-CM | POA: Diagnosis not present

## 2024-06-24 DIAGNOSIS — Z79899 Other long term (current) drug therapy: Secondary | ICD-10-CM | POA: Diagnosis not present

## 2024-06-24 DIAGNOSIS — Z87891 Personal history of nicotine dependence: Secondary | ICD-10-CM | POA: Diagnosis not present

## 2024-06-24 DIAGNOSIS — J4550 Severe persistent asthma, uncomplicated: Secondary | ICD-10-CM | POA: Diagnosis not present

## 2024-06-24 DIAGNOSIS — F419 Anxiety disorder, unspecified: Secondary | ICD-10-CM | POA: Diagnosis not present

## 2024-06-24 DIAGNOSIS — Z7951 Long term (current) use of inhaled steroids: Secondary | ICD-10-CM | POA: Diagnosis not present

## 2024-06-24 DIAGNOSIS — J449 Chronic obstructive pulmonary disease, unspecified: Secondary | ICD-10-CM | POA: Diagnosis not present

## 2024-06-24 DIAGNOSIS — J9611 Chronic respiratory failure with hypoxia: Secondary | ICD-10-CM | POA: Diagnosis not present

## 2024-06-24 DIAGNOSIS — J9612 Chronic respiratory failure with hypercapnia: Secondary | ICD-10-CM | POA: Diagnosis not present

## 2024-06-24 DIAGNOSIS — R0602 Shortness of breath: Secondary | ICD-10-CM | POA: Diagnosis not present

## 2024-06-24 NOTE — Telephone Encounter (Signed)
 There hasn't been an order placed for just what [name] recommended   I would recommend doing a nasal mask like the AirTouch N20i. These just go under the nose, have a smaller headstrap and a soft lining. I'

## 2024-06-25 ENCOUNTER — Encounter (INDEPENDENT_AMBULATORY_CARE_PROVIDER_SITE_OTHER): Payer: Self-pay | Admitting: Otolaryngology

## 2024-06-25 ENCOUNTER — Ambulatory Visit (INDEPENDENT_AMBULATORY_CARE_PROVIDER_SITE_OTHER): Admitting: Otolaryngology

## 2024-06-25 ENCOUNTER — Telehealth (HOSPITAL_COMMUNITY): Payer: Self-pay

## 2024-06-25 VITALS — BP 145/81 | HR 96

## 2024-06-25 DIAGNOSIS — R0609 Other forms of dyspnea: Secondary | ICD-10-CM

## 2024-06-25 DIAGNOSIS — J3089 Other allergic rhinitis: Secondary | ICD-10-CM

## 2024-06-25 DIAGNOSIS — R0982 Postnasal drip: Secondary | ICD-10-CM | POA: Diagnosis not present

## 2024-06-25 DIAGNOSIS — M26623 Arthralgia of bilateral temporomandibular joint: Secondary | ICD-10-CM | POA: Diagnosis not present

## 2024-06-25 NOTE — Patient Instructions (Signed)
  TMJ (Temporomandibular Joint Syndrome) The temporomandibular (tem-puh-roe-man-DIB-u-lur) joint (TMJ) acts like a sliding hinge, connecting your jawbone to your skull. You have one joint on each side of your jaw. TMJ disorders -- a type of temporomandibular disorder or TMD -- can cause pain in your jaw joint and in the muscles that control jaw movement.  The exact cause of a person's TMJ disorder is often difficult to determine. Your pain may be due to a combination of factors, such as genetics, arthritis or jaw injury. Some people who have jaw pain also tend to clench or grind their teeth (bruxism), although many people habitually clench or grind their teeth and never develop TMJ disorders.  In most cases, the pain and discomfort associated with TMJ disorders is temporary and can be relieved with self-managed care or nonsurgical treatments. This includes stress reduction, softer diet when the pain is present, anti-inflammatory pain medications such as Motrin and warm compresses.

## 2024-06-25 NOTE — Progress Notes (Signed)
 ENT CONSULT:  Reason for Consult: dyspnea concern for vocal cord dysfunction    HPI: Discussed the use of AI scribe software for clinical note transcription with the patient, who gave verbal consent to proceed.  History of Present Illness [name] is a 63 year old female with COPD/asthma and environmental allergies/on allergy  shots previously, who presents hoarseness and dyspnea. She was referred by her primary doctor for evaluation.  She has a history of COPD and quit smoking five years ago. She experiences shortness of breath triggered by smoke, perfume, cologne, dust, and animals such as dogs and cats. She is on Breztri  for her lung condition.  She has a history of allergies and was previously on allergy  shots, which were discontinued due to potential asthma flare-ups. She continues to take another shot, Fasenra , was previously on Dupixent .  She sometimes wakes up with an ear ache, but when her ears were checked by PCP she did not have an ear infection.    Records Reviewed:  [facility]  06/24/24 [name] is a 63 y.o. female who presents for a second opinion regarding shortness of breath, asthma/COPD and frequent hospitalizations. [name] is a longstanding history of what has been personally described as asthma, COPD or asthma/COPD overlap. From my discussion with her as well as reviewing the chart it appears that she was diagnosed with asthma approximately 25 years ago. At that time she was hospitalized for a respiratory flare. She initially did reasonably well however by 2013 had to stop working due to her ongoing issues with shortness of breath and frequent exacerbations. [name] smoked for about 40 years and quit smoking about 5 or 6 years ago.  As the years progressed her shortness of breath became worse. She was able to do less activity. At some point in 2023 or 2024 she began having very frequent exacerbations. She  reports probably being hospitalized 10 times in the last couple of years. She describes that the attacks tend to occur when she is doing activity. She will begin to experience shortness of breath. At that point she describes what she terms panic and the wheezing will become worse. Generally family will then call 911 and she will often be transported to the hospital. She says that at times EMS will be able to calm the situation and she does not always have to go to the hospital. She notes that whenever she has an attack it is associated with hoarseness. It is also invariably associated with loud wheezing that her family can hear. Her daughter confirms that this is the case.   Pulmonary function test demonstrate very severe obstruction. FEV/FVC is only 35%. FEV1 is markedly reduced at 27% of predicted. There is evidence of significant hyperinflation with air trapping and diffusion capacity is also very reduced.  Radiographic review: Patient has moderate centrilobular emphysema. Lungs appear to be hyperinflated. Most recent CT scan I have is from 2024 and I did not see any nodules. There was no significant evidence of mucous plugging  Exercise oximetry: I ambulated with [name] in the hallway. She had very loud audible expiratory wheezing with minimal exertion. With pursed lip breathing the wheezing abated only to return when she stopped pursed lip breathing. We walked at a slow pace and she was able to ambulate for 6 minutes. At a faster pace she began wheezing. Her oxygen  saturation at rest was 99% with a nadir of 91%  Impression:  COPD: [name] has very severe obstructive  airways disease. My strong suspicion is that this is primarily COPD. She has a history of asthma and is followed in both pulmonary and allergy  clinics. In reviewing her records I do not see evidence of significant eosinophilia and as best as I can tell her IgE levels have typically been within the normal range although she  does have positive reactions to some allergens. Pulmonary function test have always shown very severe obstruction. I do not have bronchodilator response testing however she tells me that she feels that albuterol  is helpful.  Pulmonary function testing here today demonstrates very severe obstruction with hyperinflation. Diffusion capacity is also markedly reduced consistent with underlying emphysema. Notably her flow-volume loops demonstrate that her tidal flows are higher than her forced expiratory flows. This too would fit with severe dynamic airway collapse and is more typical of COPD than asthma.  Unfortunate [name] situation seems to be severely complicated by underlying asthma and anxiety. Both she and her daughter reinforced that often when she is winded she will develop loud audible wheezing in association with her shortness of breath. This in turn often seems to make the shortness of breath worse. She has had experiences when she has called 911 where EMS has been able to improve breathing it sounds like through breathing exercises. At other times that she simply ends up in the hospital. In review of her records there has been some discussion of underlying vocal cord dysfunction. It does not sound like there has been paradoxical vocal cord movement.  Here in the clinic what I know is that when she does deep breathing forced exhalation she has very loud audible wheezing. When this occurred during her examination it clearly was somewhat anxiety provoking for her. At that point we practiced pursed lip breathing's and we were able to terminate the audible wheeze. She also developed this sort of wheezing when ambulating in the hallway. Again with pursed lip breathing we were able to terminate the wheeze and her exercise tolerance when walking at a slow pace seem to be reasonable.  At this point she appears to be on a very good COPD regimen. This includes Ohtuvayr as well as Breztri . She has a  nebulizer machine that she uses liberally. Key intervention at this point would seem to be a combination of pulmonary rotation as well as paced breathing and management of anxiety and her tendency to panic.  Longer-term it is conceivable she could be a candidate for advanced therapies including bronchoscopic lung volume reduction. She does have emphysema and is severely hyperinflated by pulmonary function testing so it is possible she would be a candidate. Before that could be considered however she needs to do rehabilitation. Longer-term it is also possible she could be considered a candidate for lung transplantation although at this point her disease does not seem to be severe enough (she is not oxygen  dependent) and it is unclear whether she will have adequate caregiver support for this very arduous undertaking. Certainly at this point she is only 62 years old so we have quite a bit of time to try to work through this possibility.  Plan: Continue your Breztri  2 puffs twice per day Continue the Ohtuvaqyre twice per day Continue as needed nebulizer Continue Singulair  Continue with your fasenra      Past Medical History:  Diagnosis Date   Anemia, iron  deficiency 12/22/2014   pt. denies   Arthritis    ASTHMA 05/12/2009   Severe AFL (Spirometry 05/2009: pre-BD FEV1 0.87L 34% pred, post-BD FEV1  1.11L 44% pred) Volumes hyperinflated Decreased DLCO that does not fully correct to normal range for alveolar volume.      Bipolar disorder (HCC)    with anxiety, depression   COPD (chronic obstructive pulmonary disease) (HCC)    Eczema 05/18/2022   Fibromyalgia 05/14/2014   GERD (gastroesophageal reflux disease)    History of kidney stones    Hyperlipidemia 04/20/2017   HYPERTENSION 05/12/2009   Qualifier: Diagnosis of  By: Primus LATHER LEODIS), [name]     Peripheral vascular disease (HCC)    Pneumonia    Prediabetes 02/23/2014   pt. denies   Seizure (HCC)    Stroke (HCC) 11/2020   Urticaria     Vitamin D  deficiency     Past Surgical History:  Procedure Laterality Date   ABDOMINAL HYSTERECTOMY     ANTERIOR CERVICAL DECOMP/DISCECTOMY FUSION N/A 07/28/2020   Procedure: ANTERIOR CERVICAL DECOMPRESSION/DISCECTOMY FUSION. INTERBODY PROTHESIS, PLATE/SCREWS CERVICAL THREE- CERVICAL FOUR, CERVICAL FOUR- CERVICAL FIVE;  Surgeon: Mavis Purchase, MD;  Location: [facility] OR;  Service: Neurosurgery;  Laterality: N/A;   BACK SURGERY     COLONOSCOPY  12/20/2011   Procedure: COLONOSCOPY;  Surgeon: Elsie CHRISTELLA Cree, MD;  Location: [facility]COPY;  Service: Endoscopy;  Laterality: N/A;   COLONOSCOPY  03/05/2012   Procedure: COLONOSCOPY;  Surgeon: Elsie CHRISTELLA Cree, MD;  Location: [facility];  Service: Endoscopy;  Laterality: N/A;   DIAGNOSTIC LAPAROSCOPY     ESOPHAGOGASTRODUODENOSCOPY (EGD) WITH PROPOFOL  N/A 10/31/2023   Procedure: ESOPHAGOGASTRODUODENOSCOPY (EGD) WITH PROPOFOL ;  Surgeon: Legrand Victory LITTIE DOUGLAS, MD;  Location: [facility] ENDOSCOPY;  Service: Gastroenterology;  Laterality: N/A;   HEMORRHOID SURGERY     INCISE AND DRAIN ABCESS     KIDNEY STONE SURGERY     NECK SURGERY     x 2 Dr JINNY Mavis   SPINE SURGERY  2019   TOE SURGERY     TUBAL LIGATION      Family History  Problem Relation Age of Onset   Diabetes Mother    COPD Mother    Heart disease Mother    Asthma Mother    Diabetes Father    Kidney disease Father    Anesthesia problems Father    Alcohol  abuse Father    Colon cancer Father 31   Diabetes Sister    Hypertension Sister    Heart disease Sister    Diabetes Sister    Brain cancer Sister    Hypertension Sister    Asthma Sister    Diabetes Sister    COPD Sister    Hypertension Sister    Breast cancer Sister 44   Anemia Sister    Hypertension Sister    Diabetes Sister    Anemia Sister    Hypertension Sister    Diabetes Brother    Sleep apnea Brother    Asthma Brother    Alcohol  abuse Brother    Heart disease Brother    Heart disease Brother    Hypertension Brother     Hypertension Daughter    Anemia Daughter    Allergic rhinitis Neg Hx    Eczema Neg Hx    Urticaria Neg Hx    Esophageal cancer Neg Hx    Prostate cancer Neg Hx    Rectal cancer Neg Hx     Social History:  reports that she quit smoking about 6 years ago. Her smoking use included cigarettes. She started smoking about 46 years ago. She has a 20 pack-year smoking history. She has never been exposed to  tobacco smoke. She has never used smokeless tobacco. She reports that she does not currently use alcohol . She reports that she does not currently use drugs after having used the following drugs: Marijuana.  Allergies:  Allergies  Allergen Reactions   Sulfa Antibiotics Nausea And Vomiting and Other (See Comments)   Venofer  [Iron  Sucrose] Hives    Medications: I have reviewed the patient's current medications.  The PMH, PSH, Medications, Allergies, and SH were reviewed and updated.  ROS: Constitutional: Negative for fever, weight loss and weight gain. Cardiovascular: Negative for chest pain and dyspnea on exertion. Respiratory: Is not experiencing shortness of breath at rest. Gastrointestinal: Negative for nausea and vomiting. Neurological: Negative for headaches. Psychiatric: The patient is not nervous/anxious  Blood pressure (!) 145/81, pulse 96, SpO2 91%. There is no height or weight on file to calculate BMI.  PHYSICAL EXAM:  Exam: General: Well-developed, well-nourished Communication and Voice: Clear pitch and clarity Respiratory Respiratory effort: Equal inspiration and expiration without stridor Cardiovascular Peripheral Vascular: Warm extremities with equal color/perfusion Eyes: No nystagmus with equal extraocular motion bilaterally Neuro/Psych/Balance: Patient oriented to person, place, and time; Appropriate mood and affect; Gait is intact with no imbalance; Cranial nerves I-XII are intact Head and Face Inspection: Normocephalic and atraumatic without mass or  lesion Palpation: Facial skeleton intact without bony stepoffs Salivary Glands: No mass or tenderness Facial Strength: Facial motility symmetric and full bilaterally ENT Pinna: External ear intact and fully developed External canal: Canal is patent with intact skin Tympanic Membrane: Clear and mobile External Nose: No scar or anatomic deformity Internal Nose: Septum is relatively straight. No polyp, or purulence. Mucosal edema and erythema present.  Bilateral inferior turbinate hypertrophy.  Lips, Teeth, and gums: Mucosa and teeth intact and viable TMJ: No pain to palpation with full mobility Oral cavity/oropharynx: No erythema or exudate, no lesions present Nasopharynx: No mass or lesion with intact mucosa Hypopharynx: Intact mucosa without pooling of secretions Larynx Glottic: Full true vocal cord mobility without lesion or mass Supraglottic: Normal appearing epiglottis and AE folds Interarytenoid Space: Moderate pachydermia&edema Subglottic Space: Patent without lesion or edema Neck Neck and Trachea: Midline trachea without mass or lesion Thyroid : No mass or nodularity Lymphatics: No lymphadenopathy  Procedure: Preoperative diagnosis: episodes of dyspnea  Postoperative diagnosis:   Same  Findings: no abnormal adduction on inspiration even with provoking maneuvers.   Procedure: Flexible fiberoptic laryngoscopy  Surgeon: Audrena Talaga, MD  Anesthesia: Topical lidocaine  and Afrin Complications: None Condition is stable throughout exam  Indications and consent:  The patient presents to the clinic with above symptoms. Indirect laryngoscopy view was incomplete. Thus it was recommended that they undergo a flexible fiberoptic laryngoscopy. All of the risks, benefits, and potential complications were reviewed with the patient preoperatively and verbal informed consent was obtained.  Procedure: The patient was seated upright in the clinic. Topical lidocaine  and Afrin were  applied to the nasal cavity. After adequate anesthesia had occurred, I then proceeded to pass the flexible telescope into the nasal cavity. The nasal cavity was patent without rhinorrhea or polyp. The nasopharynx was also patent without mass or lesion. The base of tongue was visualized and was normal. There were no signs of pooling of secretions in the piriform sinuses. The true vocal folds were mobile bilaterally. There were no signs of glottic or supraglottic mucosal lesion or mass. There was moderate interarytenoid pachydermia and post cricoid edema. The telescope was then slowly withdrawn and the patient tolerated the procedure throughout.   Studies Reviewed: PFTs:  severe obstruction. FEV/FVC 35%. FEV1 at 27% of predicted.   Assessment/Plan: Encounter Diagnoses  Name Primary?   Other form of dyspnea Yes   Environmental and seasonal allergies    Post-nasal drip    Bilateral temporomandibular joint pain     Assessment and Plan Assessment & Plan Episodes of dyspnea and hoarseness Triggers include smell of perfume, being around cats/dogs. No noisy breathing. Here to rule out VCD, hx of COPD and asthma. Hx of environmental allergies, previously on allergy  shots.  Flexible scope exam with healthy appearing VF without lesions and no abnormal adduction with inspiration even with provoking maneuvers. 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 Mild TTP of both TMJ joints on exam.  - Provided TMJ care and disorder handout in after visit summary.  Chronic Obstructive Pulmonary Disease (COPD) COPD with history of smoking, shortness of breath triggered by irritants, managed per pulmonary specialist. - Continue management of lung disease as per pulmonary specialist's recommendations.   Thank you for allowing me to participate in the care of  this patient. Please do not hesitate to contact me with any questions or concerns.   Elena Larry, MD Otolaryngology [facility] Phone: [phone] Fax: [phone]    06/25/2024, 9:33 AM

## 2024-06-25 NOTE — Telephone Encounter (Signed)
 Outside/paper referral received by Dr. Govert from [facility]. Insurance benefits and eligibility to be determined.   Patient has confirmed interest in pulmonary rehab program.

## 2024-06-26 ENCOUNTER — Telehealth (HOSPITAL_COMMUNITY): Payer: Self-pay

## 2024-06-26 NOTE — Telephone Encounter (Signed)
 Received referral from Dr. Fairy Danker from [facility] for this pt to participate in Pulmonary Rehab with the diagnosis of shortness of breath and unspecified COPD. Clinical review of pt follow up appt on 06/24/24 Pulmonary office note. Pt appropriate for scheduling for Pulmonary rehab. Will forward to support staff for scheduling and verification of insurance eligibility/benefits with pt consent.   Ronal Levin, RN, BSN Cardiac and Pulmonary Rehab

## 2024-06-29 ENCOUNTER — Telehealth (HOSPITAL_COMMUNITY): Payer: Self-pay

## 2024-06-29 NOTE — Telephone Encounter (Signed)
 Pt insurance is active and benefits verified through [facility]. Co-pay $0, DED $0/$0 met, out of pocket $0/$0 met, co-insurance 0%. No pre-authorization required. Spoke with [place] on 06/29/2024 @ 1:32pm, REF# [id number].

## 2024-06-29 NOTE — Telephone Encounter (Signed)
 Called patient to see if she was interested in participating in the Pulmonary Rehab Program. Patient will come in for orientation on 7/25 and will attend the 10:15 exercise class.  Sent MyChart message.

## 2024-07-09 ENCOUNTER — Telehealth (HOSPITAL_COMMUNITY): Payer: Self-pay

## 2024-07-09 NOTE — Telephone Encounter (Signed)
 Called and confirmed appointment for [facility] tomorrow.

## 2024-07-10 ENCOUNTER — Encounter (HOSPITAL_COMMUNITY): Payer: Self-pay

## 2024-07-10 ENCOUNTER — Encounter (HOSPITAL_COMMUNITY)
Admission: RE | Admit: 2024-07-10 | Discharge: 2024-07-10 | Disposition: A | Discharge status: Home / Self Care | Source: Ambulatory Visit | Attending: Pulmonary Disease | Admitting: Pulmonary Disease

## 2024-07-10 ENCOUNTER — Ambulatory Visit (HOSPITAL_COMMUNITY)

## 2024-07-10 VITALS — BP 130/74 | HR 77 | Ht 63.0 in | Wt 190.3 lb

## 2024-07-10 DIAGNOSIS — J449 Chronic obstructive pulmonary disease, unspecified: Secondary | ICD-10-CM | POA: Insufficient documentation

## 2024-07-10 NOTE — Progress Notes (Addendum)
 Pulmonary Individual Treatment Plan  Patient Details  Name: [name] MRN: [id number] Date of Birth: 01-08-1961 Referring Provider:   Conrad Ports Pulmonary Rehab Walk Test from 07/10/2024 in [facility]  Referring Provider Dr. Fairy Danker ([facility])    Initial Encounter Date:  Flowsheet Row Pulmonary Rehab Walk Test from 07/10/2024 in [facility]  Date 07/10/24    Visit Diagnosis: Stage 3 severe COPD by GOLD classification (HCC)  Patient's Home Medications on Admission:   Current Outpatient Medications:    AIRSUPRA  90-80 MCG/ACT AERO, SMARTSIG:2 Puff(s) Via Inhaler 4 Times Daily PRN, Disp: , Rfl:    albuterol  (PROVENTIL ) (2.5 MG/3ML) 0.083% nebulizer solution, USE 1 VIAL VIA NEBULIZER EVERY 4 HOURS AS NEEDED FOR WHEEZING OR SHORTNESS OF BREATH, Disp: 75 mL, Rfl: 1   albuterol  (VENTOLIN  HFA) 108 (90 Base) MCG/ACT inhaler, INHALE 2 PUFFS INTO THE LUNGS EVERY 6 HOURS AS NEEDED FOR WHEEZING OR SHORTNESS OF BREATH, Disp: 18 g, Rfl: 1   amLODipine  (NORVASC ) 5 MG tablet, Take 1 tablet (5 mg total) by mouth daily., Disp: 90 tablet, Rfl: 3   ARIPiprazole  (ABILIFY ) 10 MG tablet, Take 1 tablet (10 mg total) by mouth daily. Take Abilify  20 mg po daily for 2 weeks till 9/17 , then continue taking 10 mg po daily, Disp: 30 tablet, Rfl: 1   Aspirin -Salicylamide-Caffeine  (BC HEADACHE POWDER PO), Take 1 packet by mouth daily., Disp: , Rfl:    atorvastatin  (LIPITOR) 40 MG tablet, Take 1 tablet (40 mg total) by mouth daily. TAKE 1 TABLET(40 MG) BY MOUTH DAILY Strength: 40 mg, Disp: 90 tablet, Rfl: 3   azelastine  (OPTIVAR ) 0.05 % ophthalmic solution, Place 1 drop into both eyes 2 (two) times daily., Disp: 6 mL, Rfl: 12   benralizumab  (FASENRA  PEN) 30 MG/ML prefilled autoinjector, Inject 1 mL (30 mg total) into the skin every 28 (twenty-eight) days. For 3 doses then every 8 weeks,  Disp: 1 mL, Rfl: 9   Budeson-Glycopyrrol-Formoterol  (BREZTRI  AEROSPHERE) 160-9-4.8 MCG/ACT AERO, Inhale 2 puffs into the lungs in the morning and at bedtime., Disp: 10.7 g, Rfl: 11   budesonide  (PULMICORT ) 0.5 MG/2ML nebulizer solution, Take 2 mLs (0.5 mg total) by nebulization in the morning and at bedtime., Disp: 120 mL, Rfl: 2   carbamazepine  (TEGRETOL ) 200 MG tablet, Take 2-3 tablets (400-600 mg total) by mouth See admin instructions. Take 400mg  in the morning and 600mg  at bedtime., Disp: 150 tablet, Rfl: 2   cetirizine  (ZYRTEC ) 10 MG tablet, Can take one tablet by mouth once daily if needed for runny nose., Disp: 30 tablet, Rfl: 5   cromolyn  (OPTICROM ) 4 % ophthalmic solution, Place 1 drop into both eyes 4 (four) times daily., Disp: 10 mL, Rfl: 12   doxepin  (SINEQUAN ) 25 MG capsule, TAKE 1 CAPSULE(25 MG) BY MOUTH AT BEDTIME AS NEEDED, Disp: 30 capsule, Rfl: 5   Ensifentrine  (OHTUVAYRE ) 3 MG/2.5ML SUSP, Inhale 3 mg into the lungs in the morning and at bedtime., Disp: 150 mL, Rfl: 11   estradiol  (ESTRACE ) 0.1 MG/GM vaginal cream, Place 1g nightly 2-3 times a week, Disp: 30 g, Rfl: 3   famotidine  (PEPCID ) 40 MG tablet, Take 1 tablet (40 mg total) by mouth 2 (two) times daily., Disp: 60 tablet, Rfl: 5   ferrous sulfate  325 (65 FE) MG EC tablet, TAKE 1 TABLET(325 MG) BY MOUTH DAILY WITH BREAKFAST, Disp: 90 tablet, Rfl: 1   fluticasone  (  FLONASE ) 50 MCG/ACT nasal spray, SHAKE LIQUID AND USE 1 SPRAY IN EACH NOSTRIL TWICE DAILY AS NEEDED FOR ALLERGIES OR RHINITIS, Disp: 16 g, Rfl: 5   formoterol  (PERFOROMIST ) 20 MCG/2ML nebulizer solution, Take 20 mcg by nebulization 2 (two) times daily., Disp: , Rfl:    furosemide  (LASIX ) 20 MG tablet, TAKE 1 TABLET BY MOUTH DAILY AS NEEDED, Disp: 30 tablet, Rfl: 5   hydrOXYzine  (VISTARIL ) 25 MG capsule, Take one tablet every 6-8 hours as needed for itching., Disp: 30 capsule, Rfl: 1   ipratropium (ATROVENT  HFA) 17 MCG/ACT inhaler, Inhale 2 puffs into the lungs every 4  (four) hours as needed for wheezing., Disp: 1 each, Rfl: 5   irbesartan  (AVAPRO ) 150 MG tablet, Take 1 tablet (150 mg total) by mouth daily., Disp: 90 tablet, Rfl: 3   levocetirizine (XYZAL ) 5 MG tablet, Take 1 tablet (5 mg total) by mouth in the morning and at bedtime., Disp: 60 tablet, Rfl: 5   montelukast  (SINGULAIR ) 10 MG tablet, TAKE 1 TABLET(10 MG) BY MOUTH AT BEDTIME, Disp: 90 tablet, Rfl: 1   Nebulizers (PARI PRONEB MAX LC SPRINT) MISC, , Disp: , Rfl:    omeprazole  (PRILOSEC) 40 MG capsule, Take 1 capsule (40 mg total) by mouth daily., Disp: 90 capsule, Rfl: 3   ondansetron  (ZOFRAN -ODT) 4 MG disintegrating tablet, Take 1 tablet (4 mg total) by mouth every 8 (eight) hours as needed for nausea or vomiting., Disp: 30 tablet, Rfl: 1   Skin Protectants, Misc. (EUCERIN) cream, Apply 1 Application topically daily as needed for dry skin., Disp: , Rfl:    tacrolimus  (PROTOPIC ) 0.03 % ointment, Apply 1 Application topically 2 (two) times daily as needed (rash). (Patient taking differently: Apply 1 Application topically 2 (two) times daily as needed (recurrent rash).), Disp: 60 g, Rfl: 5   triamcinolone  ointment (KENALOG ) 0.1 %, Apply 1 Application topically at bedtime., Disp: 30 g, Rfl: 5   Vibegron  (GEMTESA ) 75 MG TABS, Take 1 tablet (75 mg total) by mouth daily., Disp: 30 tablet, Rfl: 11  Current Facility-Administered Medications:    benralizumab  (FASENRA ) prefilled syringe 30 mg, 30 mg, Subcutaneous, Q8 Weeks, Iva Marty Saltness, MD, 30 mg at 05/21/24 1138  Past Medical History: Past Medical History:  Diagnosis Date   Anemia, iron  deficiency 12/22/2014   pt. 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      Bipolar disorder (HCC)    with anxiety, depression   COPD (chronic obstructive pulmonary disease)  (HCC)    Eczema 05/18/2022   Fibromyalgia 05/14/2014   GERD (gastroesophageal reflux disease)    History of kidney stones    Hyperlipidemia 04/20/2017   HYPERTENSION 05/12/2009   Qualifier: Diagnosis of  By: Primus LATHER LEODIS), [name]     Peripheral vascular disease (HCC)    Pneumonia    Prediabetes 02/23/2014   pt. denies   Seizure (HCC)    Stroke (HCC) 11/2020   Urticaria    Vitamin D  deficiency     Tobacco Use: Social History   Tobacco Use  Smoking Status Former   Current packs/day: 0.00   Average packs/day: 0.5 packs/day for 40.0 years (20.0 ttl pk-yrs)   Types: Cigarettes   Start date: 36   Quit date: 04/2018   Years since quitting: 6.2   Passive exposure: Never  Smokeless Tobacco  Never  Tobacco Comments   Patient states her quit date was 05/09/2018.  Patient educated with resources at today's appointment to continue to support her to stop smoking.    Labs: Review Flowsheet  More data exists      Latest Ref Rng & Units 06/18/2023 06/23/2023 08/11/2023 08/12/2023 12/25/2023  Labs for ITP Cardiac and Pulmonary Rehab  Cholestrol 0 - 200 mg/dL 795  - - - 828   LDL (calc) 0 - 99 mg/dL 888  - - - 80   HDL-C >60.99 mg/dL 23.19  - - - 22.49   Trlycerides 0.0 - 149.0 mg/dL 17.9  - - - 35.9   Hemoglobin A1c 4.6 - 6.5 % 5.9  - - - 6.0   Bicarbonate 20.0 - 28.0 mmol/L - 34.7  26.2  24.7  -  TCO2 22 - 32 mmol/L - 30  36  27  25  -  O2 Saturation % - 87  97  98  -    Details       Multiple values from one day are sorted in reverse-chronological order         Capillary Blood Glucose: Lab Results  Component Value Date   GLUCAP 108 (H) 08/20/2023   GLUCAP 99 08/20/2023   GLUCAP 126 (H) 08/19/2023   GLUCAP 103 (H) 08/19/2023   GLUCAP 130 (H) 08/19/2023     Pulmonary Assessment Scores:  Pulmonary Assessment Scores     Row Name 07/10/24 1145         ADL UCSD   ADL Phase Entry     SOB Score total 88       CAT Score   CAT Score 32       mMRC Score   mMRC  Score 3       UCSD: Self-administered rating of dyspnea associated with activities of daily living (ADLs) 6-point scale (0 = not at all to 5 = maximal or unable to do because of breathlessness)  Scoring Scores range from 0 to 120.  Minimally important difference is 5 units  CAT: CAT can identify the health impairment of COPD patients and is better correlated with disease progression.  CAT has a scoring range of zero to 40. The CAT score is classified into four groups of low (less than 10), medium (10 - 20), high (21-30) and very high (31-40) based on the impact level of disease on health status. A CAT score over 10 suggests significant symptoms.  A worsening CAT score could be explained by an exacerbation, poor medication adherence, poor inhaler technique, or progression of COPD or comorbid conditions.  CAT MCID is 2 points  mMRC: mMRC (Modified Medical Research Council) Dyspnea Scale is used to assess the degree of baseline functional disability in patients of respiratory disease due to dyspnea. No minimal important difference is established. A decrease in score of 1 point or greater is considered a positive change.   Pulmonary Function Assessment:  Pulmonary Function Assessment - 07/10/24 1145       Breath   Bilateral Breath Sounds Clear    Shortness of Breath Yes;Fear of Shortness of Breath;Limiting activity;Panic with Shortness of Breath          Exercise Target Goals: Exercise Program Goal: Individual exercise prescription set using results from initial 6 min walk test and THRR while considering  patient's activity barriers and safety.   Exercise Prescription Goal: Initial exercise prescription builds to 30-45 minutes a day of aerobic activity, 2-3 days per week.  Home exercise guidelines will be given to patient during program as part of exercise prescription that the participant will acknowledge.  Activity Barriers & Risk Stratification:  Activity Barriers & Cardiac  Risk Stratification - 07/10/24 1028       Activity Barriers & Cardiac Risk Stratification   Activity Barriers History of Falls;Balance Concerns;Deconditioning;Muscular Weakness;Shortness of Breath;Back Problems;Assistive Device          6 Minute Walk:  6 Minute Walk     Row Name 07/10/24 1130         6 Minute Walk   Phase Initial     Distance 1020 feet     Walk Time 6 minutes     # of Rest Breaks 1  desat to 85%, 2:36-2:44 92% after PLB     MPH 1.93     METS 2.65     RPE 11     Perceived Dyspnea  1     VO2 Peak 9.27     Symptoms Yes (comment)     Comments dyspnea     Resting HR 77 bpm     Resting BP 130/74     Resting Oxygen  Saturation  98 %     Exercise Oxygen  Saturation  during 6 min walk 85 %  O2 dropped to 85%, PLB up to 92%     Max Ex. HR 104 bpm     Max Ex. BP 144/80     2 Minute Post BP 120/76       Interval HR   1 Minute HR 93     2 Minute HR 99     3 Minute HR 101     4 Minute HR 102     5 Minute HR 101     6 Minute HR 104     2 Minute Post HR 72     Interval Heart Rate? Yes       Interval Oxygen    Interval Oxygen ? Yes     Baseline Oxygen  Saturation % 98 %     1 Minute Oxygen  Saturation % 95 %     1 Minute Liters of Oxygen  0 L     2 Minute Oxygen  Saturation % 88 %  2:36-2:44, desat to 85%, PLB and back up to 92%     2 Minute Liters of Oxygen  0 L     3 Minute Oxygen  Saturation % 92 %     3 Minute Liters of Oxygen  0 L     4 Minute Oxygen  Saturation % 96 %     4 Minute Liters of Oxygen  0 L     5 Minute Oxygen  Saturation % 95 %     5 Minute Liters of Oxygen  0 L     6 Minute Oxygen  Saturation % 98 %     6 Minute Liters of Oxygen  0 L     2 Minute Post Oxygen  Saturation % 97 %     2 Minute Post Liters of Oxygen  0 L        Oxygen  Initial Assessment:  Oxygen  Initial Assessment - 07/10/24 1030       Home Oxygen    Home Oxygen  Device None    Sleep Oxygen  Prescription None    Home Exercise Oxygen  Prescription None    Home Resting Oxygen   Prescription None      Initial 6 min Walk   Oxygen  Used None      Program Oxygen  Prescription   Program Oxygen  Prescription None  Intervention   Short Term Goals To learn and understand importance of maintaining oxygen  saturations>88%;To learn and understand importance of monitoring SPO2 with pulse oximeter and demonstrate accurate use of the pulse oximeter.;To learn and demonstrate proper pursed lip breathing techniques or other breathing techniques. ;To learn and demonstrate proper use of respiratory medications;To learn and exhibit compliance with exercise, home and travel O2 prescription    Long  Term Goals Exhibits compliance with exercise, home  and travel O2 prescription;Maintenance of O2 saturations>88%;Verbalizes importance of monitoring SPO2 with pulse oximeter and return demonstration;Exhibits proper breathing techniques, such as pursed lip breathing or other method taught during program session;Demonstrates proper use of MDI's;Compliance with respiratory medication          Oxygen  Re-Evaluation:   Oxygen  Discharge (Final Oxygen  Re-Evaluation):   Initial Exercise Prescription:  Initial Exercise Prescription - 07/10/24 1100       Date of Initial Exercise RX and Referring Provider   Date 07/10/24    Referring Provider Dr. Fairy Danker ([facility])    Expected Discharge Date 07/10/24      NuStep   Level 2    SPM 60    Minutes 15    METs 1.8      Track   Laps 15    Minutes 15    METs 2.04      Prescription Details   Frequency (times per week) 2    Duration Progress to 30 minutes of continuous aerobic without signs/symptoms of physical distress      Intensity   THRR 40-80% of Max Heartrate 64-127    Ratings of Perceived Exertion 11-13    Perceived Dyspnea 0-4      Progression   Progression Continue to progress workloads to maintain intensity without signs/symptoms of physical distress.      Resistance Training   Training Prescription Yes    Weight red  bands    Reps 10-15          Perform Capillary Blood Glucose checks as needed.  Exercise Prescription Changes:   Exercise Comments:   Exercise Goals and Review:   Exercise Goals     Row Name 07/10/24 1029             Exercise Goals   Increase Physical Activity Yes       Intervention Provide advice, education, support and counseling about physical activity/exercise needs.;Develop an individualized exercise prescription for aerobic and resistive training based on initial evaluation findings, risk stratification, comorbidities and participant's personal goals.       Expected Outcomes Short Term: Attend rehab on a regular basis to increase amount of physical activity.;Long Term: Exercising regularly at least 3-5 days a week.;Long Term: Add in home exercise to make exercise part of routine and to increase amount of physical activity.       Increase Strength and Stamina Yes       Intervention Provide advice, education, support and counseling about physical activity/exercise needs.;Develop an individualized exercise prescription for aerobic and resistive training based on initial evaluation findings, risk stratification, comorbidities and participant's personal goals.       Expected Outcomes Short Term: Increase workloads from initial exercise prescription for resistance, speed, and METs.;Short Term: Perform resistance training exercises routinely during rehab and add in resistance training at home;Long Term: Improve cardiorespiratory fitness, muscular endurance and strength as measured by increased METs and functional capacity ( )       Able to understand and use rate of perceived exertion (RPE) scale Yes  Intervention Provide education and explanation on how to use RPE scale       Expected Outcomes Short Term: Able to use RPE daily in rehab to express subjective intensity level;Long Term:  Able to use RPE to guide intensity level when exercising independently       Able to  understand and use Dyspnea scale Yes       Intervention Provide education and explanation on how to use Dyspnea scale       Expected Outcomes Short Term: Able to use Dyspnea scale daily in rehab to express subjective sense of shortness of breath during exertion;Long Term: Able to use Dyspnea scale to guide intensity level when exercising independently       Knowledge and understanding of Target Heart Rate Range (THRR) Yes       Intervention Provide education and explanation of THRR including how the numbers were predicted and where they are located for reference       Expected Outcomes Short Term: Able to state/look up THRR;Short Term: Able to use daily as guideline for intensity in rehab;Long Term: Able to use THRR to govern intensity when exercising independently       Understanding of Exercise Prescription Yes       Intervention Provide education, explanation, and written materials on patient's individual exercise prescription       Expected Outcomes Short Term: Able to explain program exercise prescription;Long Term: Able to explain home exercise prescription to exercise independently          Exercise Goals Re-Evaluation :   Discharge Exercise Prescription (Final Exercise Prescription Changes):   Nutrition:  Target Goals: Understanding of nutrition guidelines, daily intake of sodium 1500mg , cholesterol 200mg , calories 30% from fat and 7% or less from saturated fats, daily to have 5 or more servings of fruits and vegetables.  Biometrics:  Pre Biometrics - 07/10/24 1020       Pre Biometrics   Grip Strength 18 kg   new dynomometer          Nutrition Therapy Plan and Nutrition Goals:   Nutrition Assessments:  MEDIFICTS Score Key: >=70 Need to make dietary changes  40-70 Heart Healthy Diet <= 40 Therapeutic Level Cholesterol Diet   Picture Your Plate Scores: <59 Unhealthy dietary pattern with much room for improvement. 41-50 Dietary pattern unlikely to meet  recommendations for good health and room for improvement. 51-60 More healthful dietary pattern, with some room for improvement.  >60 Healthy dietary pattern, although there may be some specific behaviors that could be improved.    Nutrition Goals Re-Evaluation:   Nutrition Goals Discharge (Final Nutrition Goals Re-Evaluation):   Psychosocial: Target Goals: Acknowledge presence or absence of significant depression and/or stress, maximize coping skills, provide positive support system. Participant is able to verbalize types and ability to use techniques and skills needed for reducing stress and depression.  Initial Review & Psychosocial Screening:  Initial Psych Review & Screening - 07/10/24 1030       Initial Review   Current issues with History of Depression;Current Stress Concerns    Source of Stress Concerns Family;Chronic Illness      Family Dynamics   Good Support System? Yes    Concerns Recent loss of significant other      Barriers   Psychosocial barriers to participate in program The patient should benefit from training in stress management and relaxation.;Psychosocial barriers identified (see note)      Screening Interventions   Interventions Encouraged to exercise;Program counselor consult;To provide  support and resources with identified psychosocial needs;Provide feedback about the scores to participant    Expected Outcomes Short Term goal: Utilizing psychosocial counselor, staff and physician to assist with identification of specific Stressors or current issues interfering with healing process. Setting desired goal for each stressor or current issue identified.;Long Term Goal: Stressors or current issues are controlled or eliminated.;Short Term goal: Identification and review with participant of any Quality of Life or Depression concerns found by scoring the questionnaire.;Long Term goal: The participant improves quality of Life and PHQ9 Scores as seen by post scores and/or  verbalization of changes          Quality of Life Scores:  Scores of 19 and below usually indicate a poorer quality of life in these areas.  A difference of  2-3 points is a clinically meaningful difference.  A difference of 2-3 points in the total score of the Quality of Life Index has been associated with significant improvement in overall quality of life, self-image, physical symptoms, and general health in studies assessing change in quality of life.  PHQ-9: Review Flowsheet  More data exists      07/10/2024 03/25/2024 03/06/2024 12/25/2023 12/02/2023  Depression screen PHQ 2/9  Decreased Interest 1 0 0 0 0 0  Down, Depressed, Hopeless 1 0 0 0 0 0  PHQ - 2 Score 2 0 0 0 0 0  Altered sleeping 1 - - 0 0  Tired, decreased energy 1 - - - 0  Change in appetite 1 - - 0 0  Feeling bad or failure about yourself  0 - - 0 0  Trouble concentrating 1 - - 0 0  Moving slowly or fidgety/restless 0 - - 0 0  Suicidal thoughts 0 - - 0 0  PHQ-9 Score 6 - - 0 0  Difficult doing work/chores Somewhat difficult - - Not difficult at all Not difficult at all    Details       Multiple values from one day are sorted in reverse-chronological order        Interpretation of Total Score  Total Score Depression Severity:  1-4 = Minimal depression, 5-9 = Mild depression, 10-14 = Moderate depression, 15-19 = Moderately severe depression, 20-27 = Severe depression   Psychosocial Evaluation and Intervention:  Psychosocial Evaluation - 07/10/24 1031       Psychosocial Evaluation & Interventions   Interventions Therapist referral;Encouraged to exercise with the program and follow exercise prescription    Continue Psychosocial Services  Follow up required by counselor          Psychosocial Re-Evaluation:   Psychosocial Discharge (Final Psychosocial Re-Evaluation):   Education: Education Goals: Education classes will be provided on a weekly basis, covering required topics. Participant will state  understanding/return demonstration of topics presented.  Learning Barriers/Preferences:  Learning Barriers/Preferences - 07/10/24 1032       Learning Barriers/Preferences   Learning Barriers None    Learning Preferences None          Education Topics: Know Your Numbers Group instruction that is supported by a PowerPoint presentation. Instructor discusses importance of knowing and understanding resting, exercise, and post-exercise oxygen  saturation, heart rate, and blood pressure. Oxygen  saturation, heart rate, blood pressure, rating of perceived exertion, and dyspnea are reviewed along with a normal range for these values.    Exercise for the Pulmonary Patient Group instruction that is supported by a PowerPoint presentation. Instructor discusses benefits of exercise, core components of exercise, frequency, duration, and intensity of an exercise  routine, importance of utilizing pulse oximetry during exercise, safety while exercising, and options of places to exercise outside of rehab.    MET Level  Group instruction provided by PowerPoint, verbal discussion, and written material to support subject matter. Instructor reviews what METs are and how to increase METs.    Pulmonary Medications Verbally interactive group education provided by instructor with focus on inhaled medications and proper administration.   Anatomy and Physiology of the Respiratory System Group instruction provided by PowerPoint, verbal discussion, and written material to support subject matter. Instructor reviews respiratory cycle and anatomical components of the respiratory system and their functions. Instructor also reviews differences in obstructive and restrictive respiratory diseases with examples of each.    Oxygen  Safety Group instruction provided by PowerPoint, verbal discussion, and written material to support subject matter. There is an overview of "What is Oxygen " and "Why do we need it".  Instructor  also reviews how to create a safe environment for oxygen  use, the importance of using oxygen  as prescribed, and the risks of noncompliance. There is a brief discussion on traveling with oxygen  and resources the patient may utilize.   Oxygen  Use Group instruction provided by PowerPoint, verbal discussion, and written material to discuss how supplemental oxygen  is prescribed and different types of oxygen  supply systems. Resources for more information are provided.    Breathing Techniques Group instruction that is supported by demonstration and informational handouts. Instructor discusses the benefits of pursed lip and diaphragmatic breathing and detailed demonstration on how to perform both.     Risk Factor Reduction Group instruction that is supported by a PowerPoint presentation. Instructor discusses the definition of a risk factor, different risk factors for pulmonary disease, and how the heart and lungs work together.   Pulmonary Diseases Group instruction provided by PowerPoint, verbal discussion, and written material to support subject matter. Instructor gives an overview of the different type of pulmonary diseases. There is also a discussion on risk factors and symptoms as well as ways to manage the diseases.   Stress and Energy Conservation Group instruction provided by PowerPoint, verbal discussion, and written material to support subject matter. Instructor gives an overview of stress and the impact it can have on the body. Instructor also reviews ways to reduce stress. There is also a discussion on energy conservation and ways to conserve energy throughout the day.   Warning Signs and Symptoms Group instruction provided by PowerPoint, verbal discussion, and written material to support subject matter. Instructor reviews warning signs and symptoms of stroke, heart attack, cold and flu. Instructor also reviews ways to prevent the spread of infection.   Other Education Group or  individual verbal, written, or video instructions that support the educational goals of the pulmonary rehab program.    Knowledge Questionnaire Score:  Knowledge Questionnaire Score - 07/10/24 1147       Knowledge Questionnaire Score   Pre Score 11/18          Core Components/Risk Factors/Patient Goals at Admission:  Personal Goals and Risk Factors at Admission - 07/10/24 1032       Core Components/Risk Factors/Patient Goals on Admission    Weight Management Yes;Weight Loss    Intervention Weight Management: Develop a combined nutrition and exercise program designed to reach desired caloric intake, while maintaining appropriate intake of nutrient and fiber, sodium and fats, and appropriate energy expenditure required for the weight goal.;Weight Management: Provide education and appropriate resources to help participant work on and attain dietary goals.;Weight Management/Obesity: Establish reasonable  short term and long term weight goals.;Obesity: Provide education and appropriate resources to help participant work on and attain dietary goals.    Expected Outcomes Short Term: Continue to assess and modify interventions until short term weight is achieved;Long Term: Adherence to nutrition and physical activity/exercise program aimed toward attainment of established weight goal;Weight Loss: Understanding of general recommendations for a balanced deficit meal plan, which promotes 1-2 lb weight loss per week and includes a negative energy balance of [phone] kcal/d;Understanding recommendations for meals to include 15-35% energy as protein, 25-35% energy from fat, 35-60% energy from carbohydrates, less than 200mg  of dietary cholesterol, 20-35 gm of total fiber daily;Understanding of distribution of calorie intake throughout the day with the consumption of 4-5 meals/snacks    Improve shortness of breath with ADL's Yes    Intervention Provide education, individualized exercise plan and daily activity  instruction to help decrease symptoms of SOB with activities of daily living.    Expected Outcomes Short Term: Improve cardiorespiratory fitness to achieve a reduction of symptoms when performing ADLs;Long Term: Be able to perform more ADLs without symptoms or delay the onset of symptoms    Stress Yes    Intervention Offer individual and/or small group education and counseling on adjustment to heart disease, stress management and health-related lifestyle change. Teach and support self-help strategies.;Refer participants experiencing significant psychosocial distress to appropriate mental health specialists for further evaluation and treatment. When possible, include family members and significant others in education/counseling sessions.    Expected Outcomes Short Term: Participant demonstrates changes in health-related behavior, relaxation and other stress management skills, ability to obtain effective social support, and compliance with psychotropic medications if prescribed.;Long Term: Emotional wellbeing is indicated by absence of clinically significant psychosocial distress or social isolation.          Core Components/Risk Factors/Patient Goals Review:    Core Components/Risk Factors/Patient Goals at Discharge (Final Review):    ITP Comments:   Comments: Dr. Slater Staff is Medical Director for Pulmonary Rehab at [facility].

## 2024-07-10 NOTE — Progress Notes (Signed)
  PSYCHOSOCIAL ASSESSMENT   Patient psychosocial assessment reveals patient shows poor coping skills with a positive outlook.  Grief and stress barriers to rehab participation identified. Offered emotional support and reassurance. Agreeable for ambulatory referral to [facility] and verbalizes understanding that this is a separate cost from Cardiac/Pulmonary rehab and there may be a financial obligation depending upon patient's insurance plan coverage.  PHQ9 - 6  Pt asked for referral for a psychiatrist for medication as well as a therapist for grief counseling. Stated she tried medication in the past but it didn't work. Recently lost her sister to whom she states were very close. Pt stated she has 3 children but only call her when they want or need something. Stated if she was in the hospital they wouldn't come visit her. Depends on her other sister, Mardeen for support. Kortni also reveled she gets anxiety/panics when she is short of breath. She states she leaves her apartment door unlocked in case something happens to her and she has to call her neighbor for help. Stated she has fallen in the bathroom and has to call for help. Informed Annella that we would place a referral for her needs.

## 2024-07-10 NOTE — Progress Notes (Signed)
 [name] [name] 63 y.o. female  Pulmonary Rehab Orientation Note  This patient who was referred to Pulmonary Rehab by Dr. Petra from [facility] with the diagnosis of COPD III arrived today in Cardiac and Pulmonary Rehab. She  arrived ambulatory with normal gait. She  does not carry portable oxygen . [employer] is the provider for their DME, Cpap for OSA. Per patient, [name] [name] uses oxygen  never. Color good, skin warm and dry. Patient is oriented to time and place. Patient's medical history, psychosocial health, and medications reviewed.   Psychosocial assessment reveals patient lives with alone. [name] [name] is currently disabled. Patient hobbies include watching tv and spending time with others. Patient reports her stress level is moderate. Areas of stress/anxiety include health and family . Patient does possibly exhibit signs of depression. Signs of depression include crying spells and sadness and fatigue. PHQ2/9 score 2/6. [name] [name] shows poor coping skills with positive outlook on life. Offered emotional support and reassurance. Will continue to monitor and evaluate progress toward psychosocial goal(s) of decreased stress related to her sister's passing.    Physical Assessment Reveals:  Well appearing, A&Ox4, NAD Eyes/Ears: WNL Lungs: Clear with no wheezes, rales, rhonchi, denies chronic cough, + dyspnea on exertion Heart: Regular rate rhythm, no murmurs, no rubs, no clicks Gastrointestinal: abdomin soft, + bowel sounds in all 4 quads, denies recent weight gain or loss, endorses normal BMs Genitourinary: WNL, pt denies s/s Extremities:  +2 pulses, grip strength equal, strong, no edema, no cyanosis, no clubbing Integumentary: pt denies any rashes, open or non healing wounds Psy/Soc: Pt is grieving the loss of her sister, pt asked for referral to behavioral health Assistive devices: Pt uses walker in house in bathroom, has a cane but infrequently uses  [name] reports she does take medications as  prescribed. Patient states she follows a regular  diet. The patient has been trying to lose weight through a healthy diet and exercise program. Pt's weight will be monitored closely.   Demonstration and practice of PLB using pulse oximeter. [name] able to return demonstration satisfactorily. Safety and hand hygiene in the exercise area reviewed with patient. [name] [name] voices understanding of the information reviewed. Department expectations discussed with patient and achievable goals were set. The patient shows enthusiasm about attending the program and we look forward to working with [name]. [name] completed a 6 min walk test today and is scheduled to begin exercise on 7/31 at 1015.   [id number] Ronal Levin, RN, BSN

## 2024-07-16 ENCOUNTER — Encounter (HOSPITAL_COMMUNITY)
Admission: RE | Admit: 2024-07-16 | Discharge: 2024-07-16 | Disposition: A | Discharge status: Home / Self Care | Source: Ambulatory Visit | Attending: Pulmonary Disease | Admitting: Pulmonary Disease

## 2024-07-16 DIAGNOSIS — J449 Chronic obstructive pulmonary disease, unspecified: Secondary | ICD-10-CM | POA: Diagnosis not present

## 2024-07-16 NOTE — Progress Notes (Signed)
 Daily Session Note  Patient Details  Name: [name] MRN: [id number] Date of Birth: 05-01-1961 Referring Provider:   Conrad Ports Pulmonary Rehab Walk Test from 07/10/2024 in [facility] for Heart, Vascular, [facility] Lung [facility]  Referring Provider Dr. Fairy Danker ([facility])    Encounter Date: 07/16/2024  Check In:  Session Check In - 07/16/24 1028       Check-In   Supervising physician immediately available to respond to emergencies [facility] MD immediately available    Physician(s) Josefa Beauvais, NP    Location [facility]    Staff Present Ronal Levin, RN, Maud Moats, MS, ACSM-CEP, Exercise Physiologist;Wilma Michaelson Midge BS, ACSM-CEP, Exercise Physiologist;Casey Claudene, RT    Virtual Visit No    Medication changes reported     No    Fall or balance concerns reported    Yes    Comments fall in last 6 months, knees just give out, uses walker when out of house    Tobacco Cessation No Change    Warm-up and Cool-down Performed as group-led instruction   only   Resistance Training Performed Yes    VAD Patient? No    PAD/SET Patient? No      Pain Assessment   Currently in Pain? No/denies    Multiple Pain Sites No          Capillary Blood Glucose: No results found. However, due to the size of the patient record, not all encounters were searched. Please check Results Review for a complete set of results.    Social History   Tobacco Use  Smoking Status Former   Current packs/day: 0.00   Average packs/day: 0.5 packs/day for 40.0 years (20.0 ttl pk-yrs)   Types: Cigarettes   Start date: 73   Quit date: 04/2018   Years since quitting: 6.2   Passive exposure: Never  Smokeless Tobacco Never  Tobacco Comments   Patient states her quit date was 05/09/2018.  Patient educated with resources at today's appointment to continue to support her to stop smoking.    Goals Met:  Exercise tolerated well No report of concerns or  symptoms today Strength training completed today  Goals Unmet:  Not Applicable  Comments: Service time is from 1007 to 1134.    Dr. Slater Staff is Medical Director for Pulmonary Rehab at [facility].

## 2024-07-18 ENCOUNTER — Other Ambulatory Visit: Payer: Self-pay | Admitting: Allergy & Immunology

## 2024-07-18 ENCOUNTER — Other Ambulatory Visit: Payer: Self-pay | Admitting: Internal Medicine

## 2024-07-21 ENCOUNTER — Encounter (HOSPITAL_COMMUNITY)
Admission: RE | Admit: 2024-07-21 | Discharge: 2024-07-21 | Disposition: A | Discharge status: Home / Self Care | Source: Ambulatory Visit | Attending: Pulmonary Disease | Admitting: Pulmonary Disease

## 2024-07-21 VITALS — Wt 189.6 lb

## 2024-07-21 DIAGNOSIS — J449 Chronic obstructive pulmonary disease, unspecified: Secondary | ICD-10-CM | POA: Diagnosis not present

## 2024-07-21 NOTE — Telephone Encounter (Signed)
 I have sent urgent message to [employer] about this issue

## 2024-07-21 NOTE — Telephone Encounter (Signed)
 I received a message from [place] with [employer] [facility], Ad[place]   This order was recieved and processed. The order was voided on 07/10/2024 for reason : patient canceled the order on 05-26-24.  Does patient want to re-open this order for re-review?  I have left a message asking the patient to call they office about this issue. Does she want the Cpap or did she mean to CXL the order with [employer]

## 2024-07-21 NOTE — Telephone Encounter (Signed)
 Can we message [facility],pt states she called and they said there was no order but order was placed on 05/13/2024

## 2024-07-21 NOTE — Progress Notes (Signed)
 Daily Session Note  Patient Details  Name: [name] MRN: [id number] Date of Birth: [birth year] Referring Provider:   Conrad Ports Pulmonary Rehab Walk Test from 07/10/2024 in [facility] for Heart, Vascular, & Lung Health  Referring Provider Dr. Fairy Danker ([facility])    Encounter Date: 07/21/2024  Check In:  Session Check In - 07/21/24 1133       Check-In   Supervising physician immediately available to respond to emergencies [facility] MD immediately available    Physician(s) Josefa Beauvais, NP    Location [facility]    Staff Present Ronal Levin, RN, Maud Moats, MS, ACSM-CEP, Exercise Physiologist;Randi Midge BS, ACSM-CEP, Exercise Physiologist;Casey Claudene, RT    Virtual Visit No    Medication changes reported     No    Fall or balance concerns reported    Yes    Comments fall in last 6 months, knees just give out, uses walker when out of house    Tobacco Cessation No Change    Warm-up and Cool-down Performed as group-led instruction   only   Resistance Training Performed Yes    VAD Patient? No    PAD/SET Patient? No      Pain Assessment   Currently in Pain? No/denies    Multiple Pain Sites No          Capillary Blood Glucose: No results found. However, due to the size of the patient record, not all encounters were searched. Please check Results Review for a complete set of results.   Exercise Prescription Changes - 07/21/24 1100       Response to Exercise   Blood Pressure (Admit) 112/70    Blood Pressure (Exercise) 118/70    Blood Pressure (Exit) 104/64    Heart Rate (Admit) 85 bpm    Heart Rate (Exercise) 104 bpm    Heart Rate (Exit) 86 bpm    Oxygen  Saturation (Admit) 94 %    Oxygen  Saturation (Exercise) 94 %    Oxygen  Saturation (Exit) 95 %    Rating of Perceived Exertion (Exercise) 11    Perceived Dyspnea (Exercise) 2    Duration Progress to 30 minutes of  aerobic without signs/symptoms of  physical distress    Intensity THRR unchanged      Progression   Progression Continue to progress workloads to maintain intensity without signs/symptoms of physical distress.      Resistance Training   Training Prescription Yes    Weight red bands    Reps 10-15    Time 10 Minutes      Interval Training   Interval Training No      NuStep   Level 2    SPM 67    Minutes 15    METs 1.8      Track   Laps 18    Minutes 15    METs 2.25          Social History   Tobacco Use  Smoking Status Former   Current packs/day: 0.00   Average packs/day: 0.5 packs/day for 40.0 years (20.0 ttl pk-yrs)   Types: Cigarettes   Start date: 48   Quit date: 04/2018   Years since quitting: 6.2   Passive exposure: Never  Smokeless Tobacco Never  Tobacco Comments   Patient states her quit date was 05/09/2018.  Patient educated with resources at today's appointment to continue to support her to stop smoking.    Goals Met:  Exercise tolerated well  Queuing for purse lip breathing No report of concerns or symptoms today Strength training completed today  Goals Unmet:  Not Applicable  Comments: Service time is from 1004 to 1138    Dr. Slater Staff is Medical Director for Pulmonary Rehab at [facility].

## 2024-07-22 ENCOUNTER — Other Ambulatory Visit (HOSPITAL_COMMUNITY): Payer: Self-pay

## 2024-07-22 ENCOUNTER — Ambulatory Visit: Attending: Cardiology | Admitting: Cardiology

## 2024-07-22 ENCOUNTER — Telehealth: Payer: Self-pay

## 2024-07-22 ENCOUNTER — Encounter: Payer: Self-pay | Admitting: Cardiology

## 2024-07-22 ENCOUNTER — Other Ambulatory Visit: Payer: Self-pay

## 2024-07-22 VITALS — BP 108/68 | HR 78 | Ht 63.0 in | Wt 191.0 lb

## 2024-07-22 DIAGNOSIS — I2510 Atherosclerotic heart disease of native coronary artery without angina pectoris: Secondary | ICD-10-CM | POA: Diagnosis not present

## 2024-07-22 DIAGNOSIS — I10 Essential (primary) hypertension: Secondary | ICD-10-CM

## 2024-07-22 DIAGNOSIS — I700 Atherosclerosis of aorta: Secondary | ICD-10-CM | POA: Diagnosis not present

## 2024-07-22 DIAGNOSIS — E782 Mixed hyperlipidemia: Secondary | ICD-10-CM

## 2024-07-22 DIAGNOSIS — G4734 Idiopathic sleep related nonobstructive alveolar hypoventilation: Secondary | ICD-10-CM

## 2024-07-22 DIAGNOSIS — G4733 Obstructive sleep apnea (adult) (pediatric): Secondary | ICD-10-CM

## 2024-07-22 MED ORDER — ATORVASTATIN CALCIUM 80 MG PO TABS
80.0000 mg | ORAL_TABLET | Freq: Every day | ORAL | 1 refills | Status: AC
Start: 2024-07-22 — End: ?
  Filled 2024-07-22: qty 90, 90d supply, fill #0
  Filled 2024-10-22: qty 90, 90d supply, fill #1

## 2024-07-22 NOTE — Telephone Encounter (Signed)
 Copied from CRM #[id number]. Topic: Clinical - Order For Equipment >> Jul 21, 2024  5:15 PM Rilla B wrote: Reason for CRM: Patient called to check status of CPAP. Patient states she did not cancel order.  Patient states when talking with [facility] she was told she could get the nose cannular and not wear the mask.  Patient would like CPAP and supplies. Please call patient and update.    Spoke w/ PT order has been placed    NFN

## 2024-07-22 NOTE — Telephone Encounter (Signed)
 SABRA

## 2024-07-22 NOTE — Patient Instructions (Signed)
 Medication Instructions:  INCREASE Lipitor to 80 mg   *If you need a refill on your cardiac medications before your next appointment, please call your pharmacy*   Follow-Up: At [facility], you and your health needs are our priority.  As part of our continuing mission to provide you with exceptional heart care, our providers are all part of one team.  This team includes your primary Cardiologist (physician) and Advanced Practice Providers or APPs (Physician Assistants and Nurse Practitioners) who all work together to provide you with the care you need, when you need it.  Your next appointment:   As needed   Provider:   Newman JINNY Lawrence, MD

## 2024-07-22 NOTE — Progress Notes (Signed)
 Cardiology Office Note:  .   Date:  07/22/2024  ID:  [name], DOB 1961-01-05, MRN [id number] PCP: Norleen Lynwood ORN, MD  [facility] Providers Cardiologist:  Newman Lawrence, MD PCP: Norleen Lynwood ORN, MD  Chief Complaint  Patient presents with   Hypertension   Follow-up    Echo      [name] is a 63 y.o. female with hypertension, aortic atherosclerosis, asthma, COPD overlap syndrome, with exertional dyspnea and chest pain  History of Present Illness Patient has not had any recurrence of chest pain or shortness of breath since last visit.  Reviewed recent echocardiogram and stress test results with the patient, details below.    Vitals:   07/22/24 1001  BP: 108/68  Pulse: 78  SpO2: 93%       Review of Systems  Cardiovascular:  Positive for chest pain, dyspnea on exertion and leg swelling (Occasional). Negative for palpitations and syncope.        Studies Reviewed: SABRA        Labs 1-03/2024: Chol 171, TG 64, HDL 77, LDL 80 HbA1C 6.0% Hb 10.8 Cr 0.69   CT chest lung cancer screening 03/11/2024: Aortic Atherosclerosis  Emphysema  Stress test 05/2024:   Findings are consistent with no ischemia and no infarction. The study is low risk.   No ST deviation was noted.   LV perfusion is abnormal. There is no evidence of ischemia. There is no evidence of infarction. Defect 1: There is a medium defect with mild reduction in uptake present in the apical to mid anterior and anteroseptal location(s) that is fixed. There is normal wall motion in the defect area. Consistent with artifact caused by breast attenuation.   Left ventricular function is normal. Nuclear stress EF: 52%. The left ventricular ejection fraction is mildly decreased (45-54%). End diastolic cavity size is normal. End systolic cavity size is normal.   CT images were obtained for attenuation correction and were examined for the presence of coronary calcium  when  appropriate.   Coronary calcium  was present on the attenuation correction CT images. Minimal coronary calcifications were present. Coronary calcifications were present in the left anterior descending artery, left circumflex artery and right coronary artery distribution(s).  Aortic atherosclerosis is present.   Prior study not available for comparison.    Echocardiogram 2024: LVEF 60-65%. Normal diastolic function. Aortic valve not well visualized. Mild calcification., No significant stenosis or regurgitation.  CT chest 02/2024: Lung-RADS 1, negative. Continue annual screening with low-dose chest CT without contrast in 12 months. Aortic Atherosclerosis (ICD10-I70.0) and Emphysema   Physical Exam Vitals and nursing note reviewed.  Constitutional:      General: She is not in acute distress. Neck:     Vascular: No JVD.  Cardiovascular:     Rate and Rhythm: Normal rate and regular rhythm.     Heart sounds: Normal heart sounds. No murmur heard. Pulmonary:     Effort: Pulmonary effort is normal.     Breath sounds: Examination of the right-upper field reveals wheezing. Examination of the left-upper field reveals wheezing. Examination of the right-middle field reveals wheezing. Examination of the left-middle field reveals wheezing. Examination of the right-lower field reveals decreased breath sounds. Examination of the left-lower field reveals decreased breath sounds. Decreased breath sounds and wheezing present. No rales.  Musculoskeletal:     Right lower leg: No edema.     Left lower leg: No edema.      VISIT DIAGNOSES:   ICD-10-CM  1. Coronary artery calcification  I25.10     2. Aortic atherosclerosis (HCC)  I70.0     3. Primary hypertension  I10     4. Mixed hyperlipidemia  E78.2         [name] is a 63 y.o. female with hypertension, aortic atherosclerosis, asthma, COPD overlap syndrome, with exertional dyspnea and chest pain Assessment &  Plan Exertional dyspnea, chest pain: She has severe asthma/COPD overlap syndrome with wheezing at rest, albeit without any resting hypoxia.  Structurally normal heart on echocardiogram, no ischemia/infarction on stress test (2025). Continue management of asthma/COPD overlap syndrome.  Hypertension: Well-controlled on olmesartan and amlodipine .  Mixed hyperlipidemia: Currently on Lipitor 40 mg daily.  Known coronary calcification.  LDL goal <70.  Increase Lipitor to 80 mg daily.  This can be refilled by PCP in the future.     Meds ordered this encounter  Medications   atorvastatin  (LIPITOR) 80 MG tablet    Sig: Take 1 tablet (80 mg total) by mouth daily.    Dispense:  90 tablet    Refill:  1    per secure chat with dr elmira 07/22/24 80mg  1 tab daily ALB     F/u as needed  Signed, Newman JINNY elmira, MD

## 2024-07-22 NOTE — Progress Notes (Signed)
 Pulmonary Individual Treatment Plan  Patient Details  Name: [name] MRN: [id number] Date of Birth: 06-11-1961 Referring Provider:   Conrad Ports Pulmonary Rehab Walk Test from 07/10/2024 in [facility]  Referring Provider Dr. Fairy Danker ([facility])    Initial Encounter Date:  Flowsheet Row Pulmonary Rehab Walk Test from 07/10/2024 in [facility]  Date 07/10/24    Visit Diagnosis: Stage 3 severe COPD by GOLD classification (HCC)  Patient's Home Medications on Admission:   Current Outpatient Medications:    AIRSUPRA  90-80 MCG/ACT AERO, SMARTSIG:2 Puff(s) Via Inhaler 4 Times Daily PRN, Disp: , Rfl:    albuterol  (PROVENTIL ) (2.5 MG/3ML) 0.083% nebulizer solution, USE 1 VIAL VIA NEBULIZER EVERY 4 HOURS AS NEEDED FOR WHEEZING OR SHORTNESS OF BREATH, Disp: 75 mL, Rfl: 1   albuterol  (VENTOLIN  HFA) 108 (90 Base) MCG/ACT inhaler, INHALE 2 PUFFS INTO THE LUNGS EVERY 6 HOURS AS NEEDED FOR WHEEZING OR SHORTNESS OF BREATH, Disp: 18 g, Rfl: 1   amLODipine  (NORVASC ) 5 MG tablet, Take 1 tablet (5 mg total) by mouth daily., Disp: 90 tablet, Rfl: 3   ARIPiprazole  (ABILIFY ) 10 MG tablet, Take 1 tablet (10 mg total) by mouth daily. Take Abilify  20 mg po daily for 2 weeks till 9/17 , then continue taking 10 mg po daily, Disp: 30 tablet, Rfl: 1   Aspirin -Salicylamide-Caffeine  (BC HEADACHE POWDER PO), Take 1 packet by mouth daily., Disp: , Rfl:    atorvastatin  (LIPITOR) 40 MG tablet, Take 1 tablet (40 mg total) by mouth daily. TAKE 1 TABLET(40 MG) BY MOUTH DAILY Strength: 40 mg, Disp: 90 tablet, Rfl: 3   azelastine  (OPTIVAR ) 0.05 % ophthalmic solution, Place 1 drop into both eyes 2 (two) times daily., Disp: 6 mL, Rfl: 12   benralizumab  (FASENRA  PEN) 30 MG/ML prefilled autoinjector, Inject 1 mL (30 mg total) into the skin every 28 (twenty-eight) days. For 3 doses then every 8 weeks,  Disp: 1 mL, Rfl: 9   Budeson-Glycopyrrol-Formoterol  (BREZTRI  AEROSPHERE) 160-9-4.8 MCG/ACT AERO, Inhale 2 puffs into the lungs in the morning and at bedtime., Disp: 10.7 g, Rfl: 11   budesonide  (PULMICORT ) 0.5 MG/2ML nebulizer solution, Take 2 mLs (0.5 mg total) by nebulization in the morning and at bedtime., Disp: 120 mL, Rfl: 2   carbamazepine  (TEGRETOL ) 200 MG tablet, Take 2-3 tablets (400-600 mg total) by mouth See admin instructions. Take 400mg  in the morning and 600mg  at bedtime., Disp: 150 tablet, Rfl: 2   cetirizine  (ZYRTEC ) 10 MG tablet, Can take one tablet by mouth once daily if needed for runny nose., Disp: 30 tablet, Rfl: 5   cromolyn  (OPTICROM ) 4 % ophthalmic solution, Place 1 drop into both eyes 4 (four) times daily., Disp: 10 mL, Rfl: 12   doxepin  (SINEQUAN ) 25 MG capsule, TAKE 1 CAPSULE(25 MG) BY MOUTH AT BEDTIME AS NEEDED, Disp: 30 capsule, Rfl: 5   Ensifentrine  (OHTUVAYRE ) 3 MG/2.5ML SUSP, Inhale 3 mg into the lungs in the morning and at bedtime., Disp: 150 mL, Rfl: 11   estradiol  (ESTRACE ) 0.1 MG/GM vaginal cream, Place 1g nightly 2-3 times a week, Disp: 30 g, Rfl: 3   famotidine  (PEPCID ) 40 MG tablet, Take 1 tablet (40 mg total) by mouth 2 (two) times daily., Disp: 60 tablet, Rfl: 5   ferrous sulfate  325 (65 FE) MG EC tablet, TAKE 1 TABLET(325 MG) BY MOUTH DAILY WITH BREAKFAST, Disp: 90 tablet, Rfl: 1   fluticasone  (  FLONASE ) 50 MCG/ACT nasal spray, SHAKE LIQUID AND USE 1 SPRAY IN EACH NOSTRIL TWICE DAILY AS NEEDED FOR ALLERGIES OR RHINITIS, Disp: 16 g, Rfl: 5   formoterol  (PERFOROMIST ) 20 MCG/2ML nebulizer solution, Take 20 mcg by nebulization 2 (two) times daily., Disp: , Rfl:    furosemide  (LASIX ) 20 MG tablet, TAKE 1 TABLET BY MOUTH DAILY AS NEEDED, Disp: 30 tablet, Rfl: 5   hydrOXYzine  (VISTARIL ) 25 MG capsule, Take one tablet every 6-8 hours as needed for itching., Disp: 30 capsule, Rfl: 1   ipratropium (ATROVENT  HFA) 17 MCG/ACT inhaler, Inhale 2 puffs into the lungs every 4  (four) hours as needed for wheezing., Disp: 1 each, Rfl: 5   irbesartan  (AVAPRO ) 150 MG tablet, Take 1 tablet (150 mg total) by mouth daily., Disp: 90 tablet, Rfl: 3   levocetirizine (XYZAL ) 5 MG tablet, Take 1 tablet (5 mg total) by mouth in the morning and at bedtime., Disp: 60 tablet, Rfl: 5   montelukast  (SINGULAIR ) 10 MG tablet, TAKE 1 TABLET(10 MG) BY MOUTH AT BEDTIME, Disp: 90 tablet, Rfl: 1   Nebulizers (PARI PRONEB MAX LC SPRINT) MISC, , Disp: , Rfl:    omeprazole  (PRILOSEC) 40 MG capsule, Take 1 capsule (40 mg total) by mouth daily., Disp: 90 capsule, Rfl: 3   ondansetron  (ZOFRAN -ODT) 4 MG disintegrating tablet, DISSOLVE 1 TABLET(4 MG) ON THE TONGUE EVERY 8 HOURS AS NEEDED FOR NAUSEA OR VOMITING, Disp: 30 tablet, Rfl: 1   Skin Protectants, Misc. (EUCERIN) cream, Apply 1 Application topically daily as needed for dry skin., Disp: , Rfl:    tacrolimus  (PROTOPIC ) 0.03 % ointment, Apply 1 Application topically 2 (two) times daily as needed (rash). (Patient taking differently: Apply 1 Application topically 2 (two) times daily as needed (recurrent rash).), Disp: 60 g, Rfl: 5   triamcinolone  ointment (KENALOG ) 0.1 %, Apply 1 Application topically at bedtime., Disp: 30 g, Rfl: 5   Vibegron  (GEMTESA ) 75 MG TABS, Take 1 tablet (75 mg total) by mouth daily., Disp: 30 tablet, Rfl: 11  Current Facility-Administered Medications:    benralizumab  (FASENRA ) prefilled syringe 30 mg, 30 mg, Subcutaneous, Q8 Weeks, Iva Marty Saltness, MD, 30 mg at 05/21/24 1138  Past Medical History: Past Medical History:  Diagnosis Date   Anemia, iron  deficiency 12/22/2014   pt. 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      Bipolar disorder (HCC)    with anxiety, depression   COPD (chronic obstructive pulmonary disease) (HCC)     Eczema 05/18/2022   Fibromyalgia 05/14/2014   GERD (gastroesophageal reflux disease)    History of kidney stones    Hyperlipidemia 04/20/2017   HYPERTENSION 05/12/2009   Qualifier: Diagnosis of  By: Primus LATHER LEODIS), [name]     Peripheral vascular disease (HCC)    Pneumonia    Prediabetes 02/23/2014   pt. denies   Seizure (HCC)    Stroke (HCC) 11/2020   Urticaria    Vitamin D  deficiency     Tobacco Use: Social History   Tobacco Use  Smoking Status Former   Current packs/day: 0.00   Average packs/day: 0.5 packs/day for 40.0 years (20.0 ttl pk-yrs)   Types: Cigarettes   Start date: 58   Quit date: 04/2018   Years since quitting: 6.2   Passive exposure: Never  Smokeless Tobacco Never  Tobacco Comments   Patient states her quit date was 05/09/2018.  Patient educated with resources at today's appointment to continue to support her to stop smoking.    Labs: Review Flowsheet  More data exists      Latest Ref Rng & Units 06/18/2023 06/23/2023 08/11/2023 08/12/2023 12/25/2023  Labs for ITP Cardiac and Pulmonary Rehab  Cholestrol 0 - 200 mg/dL 795  - - - 828   LDL (calc) 0 - 99 mg/dL 888  - - - 80   HDL-C >60.99 mg/dL 23.19  - - - 22.49   Trlycerides 0.0 - 149.0 mg/dL 17.9  - - - 35.9   Hemoglobin A1c 4.6 - 6.5 % 5.9  - - - 6.0   Bicarbonate 20.0 - 28.0 mmol/L - 34.7  26.2  24.7  -  TCO2 22 - 32 mmol/L - 30  36  27  25  -  O2 Saturation % - 87  97  98  -    Details       Multiple values from one day are sorted in reverse-chronological order         Capillary Blood Glucose: Lab Results  Component Value Date   GLUCAP 108 (H) 08/20/2023   GLUCAP 99 08/20/2023   GLUCAP 126 (H) 08/19/2023   GLUCAP 103 (H) 08/19/2023   GLUCAP 130 (H) 08/19/2023     Pulmonary Assessment Scores:  Pulmonary Assessment Scores     Row Name 07/10/24 1145         ADL UCSD   ADL Phase Entry     SOB Score total 88       CAT Score   CAT Score 32       mMRC Score   mMRC Score 3        UCSD: Self-administered rating of dyspnea associated with activities of daily living (ADLs) 6-point scale (0 = not at all to 5 = maximal or unable to do because of breathlessness)  Scoring Scores range from 0 to 120.  Minimally important difference is 5 units  CAT: CAT can identify the health impairment of COPD patients and is better correlated with disease progression.  CAT has a scoring range of zero to 40. The CAT score is classified into four groups of low (less than 10), medium (10 - 20), high (21-30) and very high (31-40) based on the impact level of disease on health status. A CAT score over 10 suggests significant symptoms.  A worsening CAT score could be explained by an exacerbation, poor medication adherence, poor inhaler technique, or progression of COPD or comorbid conditions.  CAT MCID is 2 points  mMRC: mMRC (Modified Medical Research Council) Dyspnea Scale is used to assess the degree of baseline functional disability in patients of respiratory disease due to dyspnea. No minimal important difference is established. A decrease in score of 1 point or greater is considered a positive change.   Pulmonary Function Assessment:  Pulmonary Function Assessment - 07/10/24 1145       Breath   Bilateral Breath Sounds Clear    Shortness of Breath Yes;Fear of Shortness of Breath;Limiting activity;Panic with Shortness of Breath          Exercise Target Goals: Exercise Program Goal: Individual exercise prescription set using results from initial 6 min walk test and THRR while considering  patient's activity barriers and safety.   Exercise Prescription Goal: Initial exercise prescription builds to 30-45 minutes a day of aerobic activity, 2-3 days per week.  Home  exercise guidelines will be given to patient during program as part of exercise prescription that the participant will acknowledge.  Activity Barriers & Risk Stratification:  Activity Barriers & Cardiac Risk  Stratification - 07/10/24 1028       Activity Barriers & Cardiac Risk Stratification   Activity Barriers History of Falls;Balance Concerns;Deconditioning;Muscular Weakness;Shortness of Breath;Back Problems;Assistive Device          6 Minute Walk:  6 Minute Walk     Row Name 07/10/24 1130         6 Minute Walk   Phase Initial     Distance 1020 feet     Walk Time 6 minutes     # of Rest Breaks 1  desat to 85%, 2:36-2:44 92% after PLB     MPH 1.93     METS 2.65     RPE 11     Perceived Dyspnea  1     VO2 Peak 9.27     Symptoms Yes (comment)     Comments dyspnea     Resting HR 77 bpm     Resting BP 130/74     Resting Oxygen  Saturation  98 %     Exercise Oxygen  Saturation  during 6 min walk 85 %  O2 dropped to 85%, PLB up to 92%     Max Ex. HR 104 bpm     Max Ex. BP 144/80     2 Minute Post BP 120/76       Interval HR   1 Minute HR 93     2 Minute HR 99     3 Minute HR 101     4 Minute HR 102     5 Minute HR 101     6 Minute HR 104     2 Minute Post HR 72     Interval Heart Rate? Yes       Interval Oxygen    Interval Oxygen ? Yes     Baseline Oxygen  Saturation % 98 %     1 Minute Oxygen  Saturation % 95 %     1 Minute Liters of Oxygen  0 L     2 Minute Oxygen  Saturation % 88 %  2:36-2:44, desat to 85%, PLB and back up to 92%     2 Minute Liters of Oxygen  0 L     3 Minute Oxygen  Saturation % 92 %     3 Minute Liters of Oxygen  0 L     4 Minute Oxygen  Saturation % 96 %     4 Minute Liters of Oxygen  0 L     5 Minute Oxygen  Saturation % 95 %     5 Minute Liters of Oxygen  0 L     6 Minute Oxygen  Saturation % 98 %     6 Minute Liters of Oxygen  0 L     2 Minute Post Oxygen  Saturation % 97 %     2 Minute Post Liters of Oxygen  0 L        Oxygen  Initial Assessment:  Oxygen  Initial Assessment - 07/10/24 1030       Home Oxygen    Home Oxygen  Device None    Sleep Oxygen  Prescription None    Home Exercise Oxygen  Prescription None    Home Resting Oxygen  Prescription  None      Initial 6 min Walk   Oxygen  Used None      Program Oxygen  Prescription   Program Oxygen  Prescription None  Intervention   Short Term Goals To learn and understand importance of maintaining oxygen  saturations>88%;To learn and understand importance of monitoring SPO2 with pulse oximeter and demonstrate accurate use of the pulse oximeter.;To learn and demonstrate proper pursed lip breathing techniques or other breathing techniques. ;To learn and demonstrate proper use of respiratory medications;To learn and exhibit compliance with exercise, home and travel O2 prescription    Long  Term Goals Exhibits compliance with exercise, home  and travel O2 prescription;Maintenance of O2 saturations>88%;Verbalizes importance of monitoring SPO2 with pulse oximeter and return demonstration;Exhibits proper breathing techniques, such as pursed lip breathing or other method taught during program session;Demonstrates proper use of MDI's;Compliance with respiratory medication          Oxygen  Re-Evaluation:  Oxygen  Re-Evaluation     Row Name 07/16/24 1545             Program Oxygen  Prescription   Program Oxygen  Prescription None         Home Oxygen    Home Oxygen  Device None       Sleep Oxygen  Prescription None       Home Exercise Oxygen  Prescription None       Home Resting Oxygen  Prescription None         Goals/Expected Outcomes   Short Term Goals To learn and understand importance of maintaining oxygen  saturations>88%;To learn and understand importance of monitoring SPO2 with pulse oximeter and demonstrate accurate use of the pulse oximeter.;To learn and demonstrate proper pursed lip breathing techniques or other breathing techniques. ;To learn and demonstrate proper use of respiratory medications;To learn and exhibit compliance with exercise, home and travel O2 prescription       Long  Term Goals Exhibits compliance with exercise, home  and travel O2 prescription;Maintenance of O2  saturations>88%;Verbalizes importance of monitoring SPO2 with pulse oximeter and return demonstration;Exhibits proper breathing techniques, such as pursed lip breathing or other method taught during program session;Demonstrates proper use of MDI's;Compliance with respiratory medication       Goals/Expected Outcomes Compliance and understanding of oxygen  saturation monitoring and breathing techniques to decrease shortness of breath.          Oxygen  Discharge (Final Oxygen  Re-Evaluation):  Oxygen  Re-Evaluation - 07/16/24 1545       Program Oxygen  Prescription   Program Oxygen  Prescription None      Home Oxygen    Home Oxygen  Device None    Sleep Oxygen  Prescription None    Home Exercise Oxygen  Prescription None    Home Resting Oxygen  Prescription None      Goals/Expected Outcomes   Short Term Goals To learn and understand importance of maintaining oxygen  saturations>88%;To learn and understand importance of monitoring SPO2 with pulse oximeter and demonstrate accurate use of the pulse oximeter.;To learn and demonstrate proper pursed lip breathing techniques or other breathing techniques. ;To learn and demonstrate proper use of respiratory medications;To learn and exhibit compliance with exercise, home and travel O2 prescription    Long  Term Goals Exhibits compliance with exercise, home  and travel O2 prescription;Maintenance of O2 saturations>88%;Verbalizes importance of monitoring SPO2 with pulse oximeter and return demonstration;Exhibits proper breathing techniques, such as pursed lip breathing or other method taught during program session;Demonstrates proper use of MDI's;Compliance with respiratory medication    Goals/Expected Outcomes Compliance and understanding of oxygen  saturation monitoring and breathing techniques to decrease shortness of breath.          Initial Exercise Prescription:  Initial Exercise Prescription - 07/10/24 1100  Date of Initial Exercise RX and Referring  Provider   Date 07/10/24    Referring Provider Dr. Fairy Danker ([facility])    Expected Discharge Date 07/10/24      NuStep   Level 2    SPM 60    Minutes 15    METs 1.8      Track   Laps 15    Minutes 15    METs 2.04      Prescription Details   Frequency (times per week) 2    Duration Progress to 30 minutes of continuous aerobic without signs/symptoms of physical distress      Intensity   THRR 40-80% of Max Heartrate 64-127    Ratings of Perceived Exertion 11-13    Perceived Dyspnea 0-4      Progression   Progression Continue to progress workloads to maintain intensity without signs/symptoms of physical distress.      Resistance Training   Training Prescription Yes    Weight red bands    Reps 10-15          Perform Capillary Blood Glucose checks as needed.  Exercise Prescription Changes:   Exercise Prescription Changes     Row Name 07/21/24 1100             Response to Exercise   Blood Pressure (Admit) 112/70       Blood Pressure (Exercise) 118/70       Blood Pressure (Exit) 104/64       Heart Rate (Admit) 85 bpm       Heart Rate (Exercise) 104 bpm       Heart Rate (Exit) 86 bpm       Oxygen  Saturation (Admit) 94 %       Oxygen  Saturation (Exercise) 94 %       Oxygen  Saturation (Exit) 95 %       Rating of Perceived Exertion (Exercise) 11       Perceived Dyspnea (Exercise) 2       Duration Progress to 30 minutes of  aerobic without signs/symptoms of physical distress       Intensity THRR unchanged         Progression   Progression Continue to progress workloads to maintain intensity without signs/symptoms of physical distress.         Resistance Training   Training Prescription Yes       Weight red bands       Reps 10-15       Time 10 Minutes         Interval Training   Interval Training No         NuStep   Level 2       SPM 67       Minutes 15       METs 1.8         Track   Laps 18       Minutes 15       METs 2.25          Exercise  Comments:   Exercise Comments     Row Name 07/16/24 1150           Exercise Comments Pt completed her first day of group exercise. She exercised on the recumbent stepper for 15 min, level 2, METs 1.7. She then walked the track for 15 min, 1.84 METs. She felt dizzy and tired walking and needed rest stops. Encouraged hydration. Performed warm up and cool down standing including squats.  Discussed METs with fair reception.          Exercise Goals and Review:   Exercise Goals     Row Name 07/10/24 1029             Exercise Goals   Increase Physical Activity Yes       Intervention Provide advice, education, support and counseling about physical activity/exercise needs.;Develop an individualized exercise prescription for aerobic and resistive training based on initial evaluation findings, risk stratification, comorbidities and participant's personal goals.       Expected Outcomes Short Term: Attend rehab on a regular basis to increase amount of physical activity.;Long Term: Exercising regularly at least 3-5 days a week.;Long Term: Add in home exercise to make exercise part of routine and to increase amount of physical activity.       Increase Strength and Stamina Yes       Intervention Provide advice, education, support and counseling about physical activity/exercise needs.;Develop an individualized exercise prescription for aerobic and resistive training based on initial evaluation findings, risk stratification, comorbidities and participant's personal goals.       Expected Outcomes Short Term: Increase workloads from initial exercise prescription for resistance, speed, and METs.;Short Term: Perform resistance training exercises routinely during rehab and add in resistance training at home;Long Term: Improve cardiorespiratory fitness, muscular endurance and strength as measured by increased METs and functional capacity ( )       Able to understand and use rate of perceived exertion (RPE)  scale Yes       Intervention Provide education and explanation on how to use RPE scale       Expected Outcomes Short Term: Able to use RPE daily in rehab to express subjective intensity level;Long Term:  Able to use RPE to guide intensity level when exercising independently       Able to understand and use Dyspnea scale Yes       Intervention Provide education and explanation on how to use Dyspnea scale       Expected Outcomes Short Term: Able to use Dyspnea scale daily in rehab to express subjective sense of shortness of breath during exertion;Long Term: Able to use Dyspnea scale to guide intensity level when exercising independently       Knowledge and understanding of Target Heart Rate Range (THRR) Yes       Intervention Provide education and explanation of THRR including how the numbers were predicted and where they are located for reference       Expected Outcomes Short Term: Able to state/look up THRR;Short Term: Able to use daily as guideline for intensity in rehab;Long Term: Able to use THRR to govern intensity when exercising independently       Understanding of Exercise Prescription Yes       Intervention Provide education, explanation, and written materials on patient's individual exercise prescription       Expected Outcomes Short Term: Able to explain program exercise prescription;Long Term: Able to explain home exercise prescription to exercise independently          Exercise Goals Re-Evaluation :  Exercise Goals Re-Evaluation     Row Name 07/16/24 1551             Exercise Goal Re-Evaluation   Exercise Goals Review Increase Physical Activity;Able to understand and use Dyspnea scale;Understanding of Exercise Prescription;Increase Strength and Stamina;Knowledge and understanding of Target Heart Rate Range (THRR);Able to understand and use rate of perceived exertion (RPE) scale  Comments Shreya has completed 1 exercise sessions. She exercises for 15 min on Nustep and  track. She averages 1.7 METs at level 2 on the Nustep and 1.84 METs on the track. She performed the warmup and cooldown standing without limitaions. George shows some deconditioning. Will continue to monitor and progress as able.       Expected Outcomes Through exercise at rehab and home, the patient will decrease shortness of breath with daily activities and feel confident in carrying out an exercise regimen at home.          Discharge Exercise Prescription (Final Exercise Prescription Changes):  Exercise Prescription Changes - 07/21/24 1100       Response to Exercise   Blood Pressure (Admit) 112/70    Blood Pressure (Exercise) 118/70    Blood Pressure (Exit) 104/64    Heart Rate (Admit) 85 bpm    Heart Rate (Exercise) 104 bpm    Heart Rate (Exit) 86 bpm    Oxygen  Saturation (Admit) 94 %    Oxygen  Saturation (Exercise) 94 %    Oxygen  Saturation (Exit) 95 %    Rating of Perceived Exertion (Exercise) 11    Perceived Dyspnea (Exercise) 2    Duration Progress to 30 minutes of  aerobic without signs/symptoms of physical distress    Intensity THRR unchanged      Progression   Progression Continue to progress workloads to maintain intensity without signs/symptoms of physical distress.      Resistance Training   Training Prescription Yes    Weight red bands    Reps 10-15    Time 10 Minutes      Interval Training   Interval Training No      NuStep   Level 2    SPM 67    Minutes 15    METs 1.8      Track   Laps 18    Minutes 15    METs 2.25          Nutrition:  Target Goals: Understanding of nutrition guidelines, daily intake of sodium 1500mg , cholesterol 200mg , calories 30% from fat and 7% or less from saturated fats, daily to have 5 or more servings of fruits and vegetables.  Biometrics:  Pre Biometrics - 07/10/24 1020       Pre Biometrics   Grip Strength 18 kg   new dynomometer          Nutrition Therapy Plan and Nutrition Goals:  Nutrition Therapy &  Goals - 07/17/24 1557       Nutrition Therapy   Diet General Healthy Diet    Drug/Food Interactions Statins/Certain Fruits      Personal Nutrition Goals   Nutrition Goal Patient to improve diet quality by using the plate method as a guide for meal planning to include lean protein/plant protein, fruits, vegetables, whole grains, nonfat dairy as part of a well-balanced diet.    Comments Patient has medical history of HTN, peripheral vascular disease, hyperlipidemia, former smoker, prediabetes, cirrhosis, COPD3. LDL is well controlled. A1c remains in a prediabetic range. Liver enzymes are WNL.  She reports following a reguarly diet and reports that she has been working on diet and exercise to aid with weight loss. Patient will benefit from participation in pulmonary rehab for nutrition education, exercise, and lifestyle modification.      Intervention Plan   Intervention Prescribe, educate and counsel regarding individualized specific dietary modifications aiming towards targeted core components such as weight, hypertension, lipid management, diabetes, heart failure  and other comorbidities.;Nutrition handout(s) given to patient.    Expected Outcomes Short Term Goal: Understand basic principles of dietary content, such as calories, fat, sodium, cholesterol and nutrients.;Long Term Goal: Adherence to prescribed nutrition plan.          Nutrition Assessments:  MEDIFICTS Score Key: >=70 Need to make dietary changes  40-70 Heart Healthy Diet <= 40 Therapeutic Level Cholesterol Diet   Picture Your Plate Scores: <59 Unhealthy dietary pattern with much room for improvement. 41-50 Dietary pattern unlikely to meet recommendations for good health and room for improvement. 51-60 More healthful dietary pattern, with some room for improvement.  >60 Healthy dietary pattern, although there may be some specific behaviors that could be improved.    Nutrition Goals Re-Evaluation:  Nutrition Goals  Re-Evaluation     Row Name 07/17/24 1557             Goals   Current Weight 191 lb 9.3 oz (86.9 kg)       Comment hemoglobin 10, A1c 6.0, LDL 80, HDL 77       Expected Outcome Patient has medical history of HTN, peripheral vascular disease, hyperlipidemia, former smoker, prediabetes, cirrhosis, COPD3. LDL is well controlled. A1c remains in a prediabetic range. Liver enzymes are WNL. She reports following a reguarly diet and reports that she has been working on diet and exercise to aid with weight loss. Will continue to monitor weight and discuss strategies for weight loss. Patient will benefit from participation in pulmonary rehab for nutrition education, exercise, and lifestyle modification.          Nutrition Goals Discharge (Final Nutrition Goals Re-Evaluation):  Nutrition Goals Re-Evaluation - 07/17/24 1557       Goals   Current Weight 191 lb 9.3 oz (86.9 kg)    Comment hemoglobin 10, A1c 6.0, LDL 80, HDL 77    Expected Outcome Patient has medical history of HTN, peripheral vascular disease, hyperlipidemia, former smoker, prediabetes, cirrhosis, COPD3. LDL is well controlled. A1c remains in a prediabetic range. Liver enzymes are WNL. She reports following a reguarly diet and reports that she has been working on diet and exercise to aid with weight loss. Will continue to monitor weight and discuss strategies for weight loss. Patient will benefit from participation in pulmonary rehab for nutrition education, exercise, and lifestyle modification.          Psychosocial: Target Goals: Acknowledge presence or absence of significant depression and/or stress, maximize coping skills, provide positive support system. Participant is able to verbalize types and ability to use techniques and skills needed for reducing stress and depression.  Initial Review & Psychosocial Screening:  Initial Psych Review & Screening - 07/10/24 1030       Initial Review   Current issues with History of  Depression;Current Stress Concerns    Source of Stress Concerns Family;Chronic Illness      Family Dynamics   Good Support System? Yes    Concerns Recent loss of significant other      Barriers   Psychosocial barriers to participate in program The patient should benefit from training in stress management and relaxation.;Psychosocial barriers identified (see note)      Screening Interventions   Interventions Encouraged to exercise;Program counselor consult;To provide support and resources with identified psychosocial needs;Provide feedback about the scores to participant    Expected Outcomes Short Term goal: Utilizing psychosocial counselor, staff and physician to assist with identification of specific Stressors or current issues interfering with healing process. Setting desired goal  for each stressor or current issue identified.;Long Term Goal: Stressors or current issues are controlled or eliminated.;Short Term goal: Identification and review with participant of any Quality of Life or Depression concerns found by scoring the questionnaire.;Long Term goal: The participant improves quality of Life and PHQ9 Scores as seen by post scores and/or verbalization of changes          Quality of Life Scores:  Scores of 19 and below usually indicate a poorer quality of life in these areas.  A difference of  2-3 points is a clinically meaningful difference.  A difference of 2-3 points in the total score of the Quality of Life Index has been associated with significant improvement in overall quality of life, self-image, physical symptoms, and general health in studies assessing change in quality of life.  PHQ-9: Review Flowsheet  More data exists      07/10/2024 03/25/2024 03/06/2024 12/25/2023 12/02/2023  Depression screen PHQ 2/9  Decreased Interest 1 0 0 0 0 0  Down, Depressed, Hopeless 1 0 0 0 0 0  PHQ - 2 Score 2 0 0 0 0 0  Altered sleeping 1 - - 0 0  Tired, decreased energy 1 - - - 0  Change in  appetite 1 - - 0 0  Feeling bad or failure about yourself  0 - - 0 0  Trouble concentrating 1 - - 0 0  Moving slowly or fidgety/restless 0 - - 0 0  Suicidal thoughts 0 - - 0 0  PHQ-9 Score 6 - - 0 0  Difficult doing work/chores Somewhat difficult - - Not difficult at all Not difficult at all    Details       Multiple values from one day are sorted in reverse-chronological order        Interpretation of Total Score  Total Score Depression Severity:  1-4 = Minimal depression, 5-9 = Mild depression, 10-14 = Moderate depression, 15-19 = Moderately severe depression, 20-27 = Severe depression   Psychosocial Evaluation and Intervention:  Psychosocial Evaluation - 07/10/24 1031       Psychosocial Evaluation & Interventions   Interventions Therapist referral;Encouraged to exercise with the program and follow exercise prescription    Continue Psychosocial Services  Follow up required by counselor          Psychosocial Re-Evaluation:  Psychosocial Re-Evaluation     Row Name 07/17/24 1249             Psychosocial Re-Evaluation   Current issues with History of Depression;Current Stress Concerns       Comments Cienna has attended one session so far. She denies any new psychosocial barriers or concerns at this time. Referral was placed to a mental health specialist.       Expected Outcomes For Roseana to have less stress while participating in the program.       Interventions Encouraged to attend Pulmonary Rehabilitation for the exercise       Continue Psychosocial Services  Follow up required by staff          Psychosocial Discharge (Final Psychosocial Re-Evaluation):  Psychosocial Re-Evaluation - 07/17/24 1249       Psychosocial Re-Evaluation   Current issues with History of Depression;Current Stress Concerns    Comments Ladonna has attended one session so far. She denies any new psychosocial barriers or concerns at this time. Referral was placed to a mental health  specialist.    Expected Outcomes For Kimiya to have less stress while participating in the  program.    Interventions Encouraged to attend Pulmonary Rehabilitation for the exercise    Continue Psychosocial Services  Follow up required by staff          Education: Education Goals: Education classes will be provided on a weekly basis, covering required topics. Participant will state understanding/return demonstration of topics presented.  Learning Barriers/Preferences:  Learning Barriers/Preferences - 07/10/24 1032       Learning Barriers/Preferences   Learning Barriers None    Learning Preferences None          Education Topics: Know Your Numbers Group instruction that is supported by a PowerPoint presentation. Instructor discusses importance of knowing and understanding resting, exercise, and post-exercise oxygen  saturation, heart rate, and blood pressure. Oxygen  saturation, heart rate, blood pressure, rating of perceived exertion, and dyspnea are reviewed along with a normal range for these values.    Exercise for the Pulmonary Patient Group instruction that is supported by a PowerPoint presentation. Instructor discusses benefits of exercise, core components of exercise, frequency, duration, and intensity of an exercise routine, importance of utilizing pulse oximetry during exercise, safety while exercising, and options of places to exercise outside of rehab.    MET Level  Group instruction provided by PowerPoint, verbal discussion, and written material to support subject matter. Instructor reviews what METs are and how to increase METs.    Pulmonary Medications Verbally interactive group education provided by instructor with focus on inhaled medications and proper administration.   Anatomy and Physiology of the Respiratory System Group instruction provided by PowerPoint, verbal discussion, and written material to support subject matter. Instructor reviews respiratory cycle  and anatomical components of the respiratory system and their functions. Instructor also reviews differences in obstructive and restrictive respiratory diseases with examples of each.    Oxygen  Safety Group instruction provided by PowerPoint, verbal discussion, and written material to support subject matter. There is an overview of "What is Oxygen " and "Why do we need it".  Instructor also reviews how to create a safe environment for oxygen  use, the importance of using oxygen  as prescribed, and the risks of noncompliance. There is a brief discussion on traveling with oxygen  and resources the patient may utilize.   Oxygen  Use Group instruction provided by PowerPoint, verbal discussion, and written material to discuss how supplemental oxygen  is prescribed and different types of oxygen  supply systems. Resources for more information are provided.    Breathing Techniques Group instruction that is supported by demonstration and informational handouts. Instructor discusses the benefits of pursed lip and diaphragmatic breathing and detailed demonstration on how to perform both.     Risk Factor Reduction Group instruction that is supported by a PowerPoint presentation. Instructor discusses the definition of a risk factor, different risk factors for pulmonary disease, and how the heart and lungs work together.   Pulmonary Diseases Group instruction provided by PowerPoint, verbal discussion, and written material to support subject matter. Instructor gives an overview of the different type of pulmonary diseases. There is also a discussion on risk factors and symptoms as well as ways to manage the diseases.   Stress and Energy Conservation Group instruction provided by PowerPoint, verbal discussion, and written material to support subject matter. Instructor gives an overview of stress and the impact it can have on the body. Instructor also reviews ways to reduce stress. There is also a discussion on  energy conservation and ways to conserve energy throughout the day.   Warning Signs and Symptoms Group instruction provided by PowerPoint, verbal discussion,  and written material to support subject matter. Instructor reviews warning signs and symptoms of stroke, heart attack, cold and flu. Instructor also reviews ways to prevent the spread of infection. Flowsheet Row PULMONARY REHAB CHRONIC OBSTRUCTIVE PULMONARY DISEASE from 07/16/2024 in [facility]  Date 07/16/24  Educator RN  Instruction Review Code 1- Verbalizes Understanding    Other Education Group or individual verbal, written, or video instructions that support the educational goals of the pulmonary rehab program.    Knowledge Questionnaire Score:  Knowledge Questionnaire Score - 07/10/24 1147       Knowledge Questionnaire Score   Pre Score 11/18          Core Components/Risk Factors/Patient Goals at Admission:  Personal Goals and Risk Factors at Admission - 07/10/24 1032       Core Components/Risk Factors/Patient Goals on Admission    Weight Management Yes;Weight Loss    Intervention Weight Management: Develop a combined nutrition and exercise program designed to reach desired caloric intake, while maintaining appropriate intake of nutrient and fiber, sodium and fats, and appropriate energy expenditure required for the weight goal.;Weight Management: Provide education and appropriate resources to help participant work on and attain dietary goals.;Weight Management/Obesity: Establish reasonable short term and long term weight goals.;Obesity: Provide education and appropriate resources to help participant work on and attain dietary goals.    Expected Outcomes Short Term: Continue to assess and modify interventions until short term weight is achieved;Long Term: Adherence to nutrition and physical activity/exercise program aimed toward attainment of established weight  goal;Weight Loss: Understanding of general recommendations for a balanced deficit meal plan, which promotes 1-2 lb weight loss per week and includes a negative energy balance of [id number] kcal/d;Understanding recommendations for meals to include 15-35% energy as protein, 25-35% energy from fat, 35-60% energy from carbohydrates, less than 200mg  of dietary cholesterol, 20-35 gm of total fiber daily;Understanding of distribution of calorie intake throughout the day with the consumption of 4-5 meals/snacks    Improve shortness of breath with ADL's Yes    Intervention Provide education, individualized exercise plan and daily activity instruction to help decrease symptoms of SOB with activities of daily living.    Expected Outcomes Short Term: Improve cardiorespiratory fitness to achieve a reduction of symptoms when performing ADLs;Long Term: Be able to perform more ADLs without symptoms or delay the onset of symptoms    Stress Yes    Intervention Offer individual and/or small group education and counseling on adjustment to heart disease, stress management and health-related lifestyle change. Teach and support self-help strategies.;Refer participants experiencing significant psychosocial distress to appropriate mental health specialists for further evaluation and treatment. When possible, include family members and significant others in education/counseling sessions.    Expected Outcomes Short Term: Participant demonstrates changes in health-related behavior, relaxation and other stress management skills, ability to obtain effective social support, and compliance with psychotropic medications if prescribed.;Long Term: Emotional wellbeing is indicated by absence of clinically significant psychosocial distress or social isolation.          Core Components/Risk Factors/Patient Goals Review:   Goals and Risk Factor Review     Row Name 07/17/24 1253             Core Components/Risk Factors/Patient Goals  Review   Personal Goals Review Weight Management/Obesity;Improve shortness of breath with ADL's;Develop more efficient breathing techniques such as purse lipped breathing and diaphragmatic breathing and practicing self-pacing with activity.;Stress  Review Monthly review of patient's Core Components/Risk Factors/Patient Goals are as follows: Margerite has attended one session so far. Goal progressing for losing weight. Goal progressing for improving shortness of breath with ADL's. Georgeanne is currently exercising on RA to maintain sats >88%. Goal progressing for developing more efficient breathing techniques such as purse lipped breathing and diaphragmatic breathing; and practicing self-pacing with activity. Goal progressing to reduce stress. We will continue to monitor Riannah's progress throughout the program.       Expected Outcomes To improve shortness of breath with ADL's, develop more efficient breathing techniques such as purse lipped breathing and diaphragmatic breathing; and practicing self-pacing with activity, lose weight and reduce stress          Core Components/Risk Factors/Patient Goals at Discharge (Final Review):   Goals and Risk Factor Review - 07/17/24 1253       Core Components/Risk Factors/Patient Goals Review   Personal Goals Review Weight Management/Obesity;Improve shortness of breath with ADL's;Develop more efficient breathing techniques such as purse lipped breathing and diaphragmatic breathing and practicing self-pacing with activity.;Stress    Review Monthly review of patient's Core Components/Risk Factors/Patient Goals are as follows: Mykayla has attended one session so far. Goal progressing for losing weight. Goal progressing for improving shortness of breath with ADL's. Khala is currently exercising on RA to maintain sats >88%. Goal progressing for developing more efficient breathing techniques such as purse lipped breathing and diaphragmatic breathing; and practicing  self-pacing with activity. Goal progressing to reduce stress. We will continue to monitor Delorese's progress throughout the program.    Expected Outcomes To improve shortness of breath with ADL's, develop more efficient breathing techniques such as purse lipped breathing and diaphragmatic breathing; and practicing self-pacing with activity, lose weight and reduce stress          ITP Comments:Pt is making expected progress toward Pulmonary Rehab goals after completing 2 session(s). Recommend continued exercise, life style modification, education, and utilization of breathing techniques to increase stamina and strength, while also decreasing shortness of breath with exertion.  Dr. Slater Staff is Medical Director for Pulmonary Rehab at [facility].

## 2024-07-23 ENCOUNTER — Ambulatory Visit

## 2024-07-23 ENCOUNTER — Encounter (HOSPITAL_COMMUNITY)

## 2024-07-23 DIAGNOSIS — J4550 Severe persistent asthma, uncomplicated: Secondary | ICD-10-CM | POA: Diagnosis not present

## 2024-07-28 ENCOUNTER — Encounter (HOSPITAL_COMMUNITY)
Admission: RE | Admit: 2024-07-28 | Discharge: 2024-07-28 | Disposition: A | Discharge status: Home / Self Care | Source: Ambulatory Visit | Attending: Pulmonary Disease | Admitting: Pulmonary Disease

## 2024-07-28 DIAGNOSIS — J449 Chronic obstructive pulmonary disease, unspecified: Secondary | ICD-10-CM

## 2024-07-28 NOTE — Progress Notes (Signed)
 Daily Session Note  Patient Details  Name: [name] MRN: [id number] Date of Birth: [birth year] Referring Provider:   Conrad Ports Pulmonary Rehab Walk Test from 07/10/2024 in [facility], [facility]  Referring Provider Dr. Fairy Danker ([facility])    Encounter Date: 07/28/2024  Check In:  Session Check In - 07/28/24 1205       Check-In   Supervising physician immediately available to respond to emergencies [facility] MD immediately available    Physician(s) Rosabel Mose, NP    Location [facility]    Staff Present [name], MS, ACSM-CEP, Exercise Physiologist;Casey Claudene Evert Byes, RN, MHA;Carlette Bernett, RN, BSN    Virtual Visit No    Medication changes reported     No    Fall or balance concerns reported    Yes    Comments fall in last 6 months, knees just give out, uses walker when out of house    Tobacco Cessation No Change    Warm-up and Cool-down Performed as group-led instruction    Resistance Training Performed Yes    VAD Patient? No    PAD/SET Patient? No      Pain Assessment   Currently in Pain? No/denies    Multiple Pain Sites No          Capillary Blood Glucose: No results found. However, due to the size of the patient record, not all encounters were searched. Please check Results Review for a complete set of results.    Social History   Tobacco Use  Smoking Status Former   Current packs/day: 0.00   Average packs/day: 0.5 packs/day for 40.0 years (20.0 ttl pk-yrs)   Types: Cigarettes   Start date: 73   Quit date: 04/2018   Years since quitting: 6.2   Passive exposure: Never  Smokeless Tobacco Never  Tobacco Comments   Patient states her quit date was 05/09/2018.  Patient educated with resources at today's appointment to continue to support her to stop smoking.    Goals Met:  Proper associated with RPD/PD & O2 Sat Exercise tolerated well No report of  concerns or symptoms today Strength training completed today  Goals Unmet:  Not Applicable  Comments: Service time is from 1016 to 1135.    Dr. Slater Staff is Medical Director for Pulmonary Rehab at [facility].

## 2024-07-29 ENCOUNTER — Inpatient Hospital Stay (HOSPITAL_COMMUNITY): Admission: RE | Admit: 2024-07-29 | Source: Ambulatory Visit

## 2024-07-30 ENCOUNTER — Encounter (HOSPITAL_COMMUNITY)
Admission: RE | Admit: 2024-07-30 | Discharge: 2024-07-30 | Disposition: A | Discharge status: Home / Self Care | Source: Ambulatory Visit | Attending: Pulmonary Disease | Admitting: Pulmonary Disease

## 2024-07-30 DIAGNOSIS — J449 Chronic obstructive pulmonary disease, unspecified: Secondary | ICD-10-CM | POA: Diagnosis not present

## 2024-07-30 NOTE — Progress Notes (Signed)
 Daily Session Note  Patient Details  Name: [name] MRN: [id number] Date of Birth: 06-Feb-1961 Referring Provider:   Conrad Ports Pulmonary Rehab Walk Test from 07/10/2024 in [facility]  Referring Provider Dr. Fairy Danker ([facility])    Encounter Date: 07/30/2024  Check In:  Session Check In - 07/30/24 1201       Check-In   Supervising physician immediately available to respond to emergencies [facility] MD immediately available    Physician(s) Damien Braver, NP    Location [facility]    Staff Present [name], MS, ACSM-CEP, Exercise Physiologist;Casey Claudene Maya Koyanagi, RN, Cathyann Levin, RN, BSN    Virtual Visit No    Medication changes reported     No    Fall or balance concerns reported    Yes    Comments fall in last 6 months, knees just give out, uses walker when out of house    Tobacco Cessation No Change    Warm-up and Cool-down Performed as group-led instruction    Resistance Training Performed Yes    VAD Patient? No    PAD/SET Patient? No      Pain Assessment   Currently in Pain? No/denies    Multiple Pain Sites No          Capillary Blood Glucose: No results found. However, due to the size of the patient record, not all encounters were searched. Please check Results Review for a complete set of results.    Social History   Tobacco Use  Smoking Status Former   Current packs/day: 0.00   Average packs/day: 0.5 packs/day for 40.0 years (20.0 ttl pk-yrs)   Types: Cigarettes   Start date: 25   Quit date: 04/2018   Years since quitting: 6.2   Passive exposure: Never  Smokeless Tobacco Never  Tobacco Comments   Patient states her quit date was 05/09/2018.  Patient educated with resources at today's appointment to continue to support her to stop smoking.    Goals Met:  Exercise tolerated well Queuing for purse lip breathing No report of concerns or  symptoms today Strength training completed today  Goals Unmet:  Not Applicable  Comments: Service time is from 1000 to 1120    Dr. Slater Staff is Medical Director for Pulmonary Rehab at [facility].

## 2024-08-04 ENCOUNTER — Encounter (HOSPITAL_COMMUNITY)
Admission: RE | Admit: 2024-08-04 | Discharge: 2024-08-04 | Disposition: A | Discharge status: Home / Self Care | Source: Ambulatory Visit | Attending: Pulmonary Disease | Admitting: Pulmonary Disease

## 2024-08-04 VITALS — Wt 190.3 lb

## 2024-08-04 DIAGNOSIS — J449 Chronic obstructive pulmonary disease, unspecified: Secondary | ICD-10-CM | POA: Diagnosis not present

## 2024-08-04 NOTE — Progress Notes (Signed)
 Daily Session Note  Patient Details  Name: [name] MRN: [id number] Date of Birth: October 29, 1961 Referring Provider:   Conrad Ports Pulmonary Rehab Walk Test from 07/10/2024 in [facility] for Heart, Vascular, & Lung Health  Referring Provider Dr. Fairy Danker ([facility])    Encounter Date: 08/04/2024  Check In:  Session Check In - 08/04/24 1217       Check-In   Supervising physician immediately available to respond to emergencies [facility] MD immediately available    Physician(s) Lum Louis, NP    Location [facility]    Staff Present [name], MS, ACSM-CEP, Exercise Physiologist;Casey Claudene Maya Koyanagi, RN, Cathyann Levin, RN, BSN    Virtual Visit No    Medication changes reported     No    Fall or balance concerns reported    Yes    Comments fall in last 6 months, knees just give out, uses walker when out of house    Tobacco Cessation No Change    Warm-up and Cool-down Performed as group-led instruction    Resistance Training Performed Yes    VAD Patient? No    PAD/SET Patient? No      Pain Assessment   Currently in Pain? No/denies    Multiple Pain Sites No          Capillary Blood Glucose: No results found. However, due to the size of the patient record, not all encounters were searched. Please check Results Review for a complete set of results.   Exercise Prescription Changes - 08/04/24 1200       Response to Exercise   Blood Pressure (Admit) 120/70    Blood Pressure (Exercise) 128/70    Blood Pressure (Exit) 104/68    Heart Rate (Admit) 80 bpm    Heart Rate (Exercise) 93 bpm    Heart Rate (Exit) 68 bpm    Oxygen  Saturation (Admit) 98 %    Oxygen  Saturation (Exercise) 94 %    Oxygen  Saturation (Exit) 97 %    Rating of Perceived Exertion (Exercise) 10    Perceived Dyspnea (Exercise) 1    Duration Progress to 30 minutes of  aerobic without signs/symptoms of physical distress    Intensity  THRR unchanged      Progression   Progression Continue to progress workloads to maintain intensity without signs/symptoms of physical distress.      Resistance Training   Training Prescription Yes    Weight red bands    Reps 10-15    Time 10 Minutes      NuStep   Level 2    SPM 78    Minutes 215    METs 2.1      Track   Laps 21    Minutes 15    METs 2.46          Social History   Tobacco Use  Smoking Status Former   Current packs/day: 0.00   Average packs/day: 0.5 packs/day for 40.0 years (20.0 ttl pk-yrs)   Types: Cigarettes   Start date: 65   Quit date: 04/2018   Years since quitting: 6.3   Passive exposure: Never  Smokeless Tobacco Never  Tobacco Comments   Patient states her quit date was 05/09/2018.  Patient educated with resources at today's appointment to continue to support her to stop smoking.    Goals Met:  Proper associated with RPD/PD & O2 Sat Independence with exercise equipment Exercise tolerated well No report of concerns or symptoms  today Strength training completed today  Goals Unmet:  Not Applicable  Comments: Service time is from 1016 to 1140.    Dr. Slater Staff is Medical Director for Pulmonary Rehab at [facility].

## 2024-08-06 ENCOUNTER — Encounter (HOSPITAL_COMMUNITY)
Admission: RE | Admit: 2024-08-06 | Discharge: 2024-08-06 | Disposition: A | Discharge status: Home / Self Care | Source: Ambulatory Visit | Attending: Pulmonary Disease

## 2024-08-06 DIAGNOSIS — J449 Chronic obstructive pulmonary disease, unspecified: Secondary | ICD-10-CM | POA: Diagnosis not present

## 2024-08-06 LAB — GLUCOSE, CAPILLARY: Glucose-Capillary: 128 mg/dL — ABNORMAL HIGH (ref 70–99)

## 2024-08-06 NOTE — Progress Notes (Signed)
 Daily Session Note  Patient Details  Name: [name] MRN: [id number] Date of Birth: [birth year] Referring Provider:   Conrad Ports Pulmonary Rehab Walk Test from 07/10/2024 in [facility] for Heart, Vascular, & Lung Health  Referring Provider Dr. Fairy Danker ([facility])    Encounter Date: 08/06/2024  Check In:  Session Check In - 08/06/24 1038       Check-In   Supervising physician immediately available to respond to emergencies [facility] immediately available    Physician(s) Rosaline Bane, NP    Staff Present [name], MS, ACSM-CEP, Exercise Physiologist;Lavi Sheehan Claudene Candia Levin, RN, BSN;Randi Reeve BS, ACSM-CEP, Exercise Physiologist    Virtual Visit No    Medication changes reported     No    Fall or balance concerns reported    No    Tobacco Cessation No Change    Warm-up and Cool-down Performed as group-led instruction    Resistance Training Performed Yes    VAD Patient? No    PAD/SET Patient? No      Pain Assessment   Currently in Pain? No/denies          Capillary Blood Glucose: No results found. However, due to the size of the patient record, not all encounters were searched. Please check Results Review for a complete set of results.    Social History   Tobacco Use  Smoking Status Former   Current packs/day: 0.00   Average packs/day: 0.5 packs/day for 40.0 years (20.0 ttl pk-yrs)   Types: Cigarettes   Start date: 56   Quit date: 04/2018   Years since quitting: 6.3   Passive exposure: Never  Smokeless Tobacco Never  Tobacco Comments   Patient states her quit date was 05/09/2018.  Patient educated with resources at today's appointment to continue to support her to stop smoking.    Goals Met:  Proper associated with RPD/PD & O2 Sat Independence with exercise equipment Exercise tolerated well No report of concerns or symptoms today Strength training completed today  Goals Unmet:  Not  Applicable  Comments: Service time is from 1005 to 1140.    Dr. Slater Staff is Medical Director for Pulmonary Rehab at [facility].

## 2024-08-10 ENCOUNTER — Telehealth (HOSPITAL_COMMUNITY): Payer: Self-pay

## 2024-08-10 ENCOUNTER — Other Ambulatory Visit: Payer: Self-pay | Admitting: Emergency Medicine

## 2024-08-10 ENCOUNTER — Other Ambulatory Visit: Payer: Self-pay | Admitting: Allergy & Immunology

## 2024-08-10 NOTE — Telephone Encounter (Signed)
 Returned pt's call to verify that she did cancel her appointment for tomorrow. Pt asking to make up missed classes. Will forward msg to her exercise physiologist.

## 2024-08-10 NOTE — Telephone Encounter (Signed)
 Can you help with this?

## 2024-08-10 NOTE — Telephone Encounter (Signed)
 Patient left message calling out for 10:15 PR class on 8/26. She states she would like to extend her grad date to make up her missed classes.

## 2024-08-11 ENCOUNTER — Encounter (HOSPITAL_COMMUNITY)

## 2024-08-11 NOTE — Telephone Encounter (Signed)
 I have spoke with [name] per her request the cpap order has been sent to [facility]

## 2024-08-11 NOTE — Telephone Encounter (Signed)
 Back in 02/2024 call came in from  [facility], Respiratory Therapist with [employer] states she spoke with this patient and she states she is no longer using her ventilator and she wants [employer] to pick it up----she will need an order from the provider   call back is [phone]    We place an order to [facility] but it looks like [employer] kept billing the insurance. Patient now has outstanding bill with [employer]. They will not help patient get her cpap machine. I called Mrs. Stoneberg and spoke with her she asked if we could send her cpap order to [facility] or somewhere else. I saw that her [employer] plan is a PPO and yes the order can be sent to [facility]. I told Mrs. Klaus I would send the order to [facility] for her

## 2024-08-12 ENCOUNTER — Telehealth (HOSPITAL_COMMUNITY): Payer: Self-pay

## 2024-08-12 NOTE — Telephone Encounter (Signed)
 [name]

## 2024-08-12 NOTE — Telephone Encounter (Signed)
 SABRA

## 2024-08-13 ENCOUNTER — Encounter (HOSPITAL_COMMUNITY)

## 2024-08-14 NOTE — Telephone Encounter (Signed)
 Dr. Shelah- please see mychart msg from the pt. I responded to her and told her she needs to schedule appt.    Hello  I just spoke with [name] at [place] , and she stated that after carefully reviewing my medical history. She thinks that I need a Noninvasive Ventilator instead of the Cpap machine. She ask that your office give her a call at [phone] option 1 and ask for [name]  Thank you so much for your help

## 2024-08-15 ENCOUNTER — Encounter (HOSPITAL_COMMUNITY): Payer: Self-pay

## 2024-08-18 ENCOUNTER — Encounter (HOSPITAL_COMMUNITY)
Admission: RE | Admit: 2024-08-18 | Discharge: 2024-08-18 | Disposition: A | Discharge status: Home / Self Care | Source: Ambulatory Visit | Attending: Pulmonary Disease | Admitting: Pulmonary Disease

## 2024-08-18 ENCOUNTER — Telehealth (HOSPITAL_COMMUNITY): Payer: Self-pay | Admitting: Emergency Medicine

## 2024-08-18 VITALS — Wt 189.8 lb

## 2024-08-18 DIAGNOSIS — J449 Chronic obstructive pulmonary disease, unspecified: Secondary | ICD-10-CM | POA: Insufficient documentation

## 2024-08-18 NOTE — Patient Instructions (Incomplete)
 1. Asthma-COPD overlap syndrome - Continue to follow up with pulmonology - Daily controller medication(s): Breztri  two puffs twice daily + Ohtuvayre  nebulizer twice daily + Fasenra  every every 8 weeks (after 3 doses) + azithromycin  three times weekly until we see you again (Mon/Wed/Fri). - Prior to physical activity: Ventolin  (GRAY INHALER) 2 puffs 10-15 minutes before physical activity. - Rescue medications: albuterol  nebulizer one vial every 4-6 hours as needed OR Ventolin  (GRAY INHALER) 2 puffs AND 2 puffs Atrovent  (GREEN INHALER) every 4-6 hours as needed - Asthma control goals:  * Full participation in all desired activities (may need albuterol  before activity) * Albuterol  use two time or less a week on average (not counting use with activity) * Cough interfering with sleep two time or less a month * Oral steroids no more than once a year * No hospitalizations  2. Chronic urticaria - under good control - Continue with levocetirizine twice daily EVERY DAY.  - Continue with famotidine  twice daily EVERY DAY.  - Continue with Doxepin  25mg  at night to help with itching AS NEEDED.  - Continue with triamcinolone  twice daily as needed for your lesion.   3. 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 - Continue to hold the allergy  shots.   4. GERD - Continue famotidine  daily . - Continue omeprazole  daily.  5. Follow up in months or [place]ooner

## 2024-08-18 NOTE — Progress Notes (Signed)
 Daily Session Note  Patient Details  Name: [name] MRN: [id number] Date of Birth: 1961-03-26 Referring Provider:   Conrad Ports Pulmonary Rehab Walk Test from 07/10/2024 in [facility] for Heart, Vascular, & Lung Health  Referring Provider Dr. Fairy Danker ([facility])    Encounter Date: 08/18/2024  Check In:  Session Check In - 08/18/24 1124       Check-In   Supervising physician immediately available to respond to emergencies [facility] immediately available    Physician(s) Josefa Beauvais, NP    Location [facility]    Staff Present [name], MS, ACSM-CEP, Exercise Physiologist;Shira Bobst Claudene Candia Levin, RN, BSN;Randi Reeve BS, ACSM-CEP, Exercise Physiologist    Virtual Visit No    Medication changes reported     No    Fall or balance concerns reported    No    Tobacco Cessation No Change    Warm-up and Cool-down Performed as group-led instruction    Resistance Training Performed Yes    VAD Patient? No    PAD/SET Patient? No      Pain Assessment   Currently in Pain? No/denies          Capillary Blood Glucose: No results found. However, due to the size of the patient record, not all encounters were searched. Please check Results Review for a complete set of results.   Exercise Prescription Changes - 08/18/24 1200       Response to Exercise   Blood Pressure (Admit) 132/68    Blood Pressure (Exercise) 142/70    Blood Pressure (Exit) 128/78    Heart Rate (Admit) 95 bpm    Heart Rate (Exercise) 88 bpm    Heart Rate (Exit) 82 bpm    Oxygen  Saturation (Admit) 96 %    Oxygen  Saturation (Exercise) 96 %    Oxygen  Saturation (Exit) 98 %    Rating of Perceived Exertion (Exercise) 10    Perceived Dyspnea (Exercise) 1    Duration Continue with 30 min of aerobic exercise without signs/symptoms of physical distress.    Intensity THRR unchanged      Progression   Progression Continue to progress workloads to maintain  intensity without signs/symptoms of physical distress.      Resistance Training   Training Prescription Yes    Weight red bands    Reps 10-15    Time 10 Minutes      NuStep   Level 2    SPM 80    Minutes 15    METs 2.1      Track   Laps 16    Minutes 15    METs 2.1          Social History   Tobacco Use  Smoking Status Former   Current packs/day: 0.00   Average packs/day: 0.5 packs/day for 40.0 years (20.0 ttl pk-yrs)   Types: Cigarettes   Start date: 76   Quit date: 04/2018   Years since quitting: 6.3   Passive exposure: Never  Smokeless Tobacco Never  Tobacco Comments   Patient states her quit date was 05/09/2018.  Patient educated with resources at today's appointment to continue to support her to stop smoking.    Goals Met:  Proper associated with RPD/PD & O2 Sat Independence with exercise equipment Exercise tolerated well No report of concerns or symptoms today Strength training completed today  Goals Unmet:  Not Applicable  Comments: Service time is from 1006 to 1135.    Dr.  Slater Staff is Medical Director for Pulmonary Rehab at [facility].

## 2024-08-18 NOTE — Telephone Encounter (Signed)
Attempted to call patient regarding upcoming cardiac PET appointment. Left message on voicemail with name and callback number Aidan Moten RN Navigator Cardiac Imaging [facility] [phone] Office [phone] Cell  

## 2024-08-19 ENCOUNTER — Other Ambulatory Visit: Payer: Self-pay

## 2024-08-19 ENCOUNTER — Encounter: Payer: Self-pay | Admitting: Family

## 2024-08-19 ENCOUNTER — Ambulatory Visit (INDEPENDENT_AMBULATORY_CARE_PROVIDER_SITE_OTHER): Admitting: Family

## 2024-08-19 ENCOUNTER — Ambulatory Visit

## 2024-08-19 ENCOUNTER — Encounter (HOSPITAL_COMMUNITY)
Admission: RE | Admit: 2024-08-19 | Discharge: 2024-08-19 | Disposition: A | Discharge status: Home / Self Care | Source: Ambulatory Visit | Attending: Cardiology | Admitting: Cardiology

## 2024-08-19 VITALS — BP 122/82 | HR 71 | Temp 97.9°F

## 2024-08-19 DIAGNOSIS — R0609 Other forms of dyspnea: Secondary | ICD-10-CM | POA: Insufficient documentation

## 2024-08-19 DIAGNOSIS — R072 Precordial pain: Secondary | ICD-10-CM | POA: Insufficient documentation

## 2024-08-19 DIAGNOSIS — B999 Unspecified infectious disease: Secondary | ICD-10-CM

## 2024-08-19 DIAGNOSIS — J441 Chronic obstructive pulmonary disease with (acute) exacerbation: Secondary | ICD-10-CM

## 2024-08-19 DIAGNOSIS — L508 Other urticaria: Secondary | ICD-10-CM

## 2024-08-19 DIAGNOSIS — J3089 Other allergic rhinitis: Secondary | ICD-10-CM | POA: Diagnosis not present

## 2024-08-19 DIAGNOSIS — K219 Gastro-esophageal reflux disease without esophagitis: Secondary | ICD-10-CM

## 2024-08-19 MED ORDER — RUBIDIUM RB82 GENERATOR (RUBYFILL)
22.0000 | PACK | Freq: Once | INTRAVENOUS | Status: AC
  Administered 2024-08-19: 22 via INTRAVENOUS

## 2024-08-19 MED ORDER — REGADENOSON 0.4 MG/5ML IV SOLN
0.4000 mg | Freq: Once | INTRAVENOUS | Status: AC
  Administered 2024-08-19: 0.4 mg via INTRAVENOUS

## 2024-08-19 MED ORDER — RUBIDIUM RB82 GENERATOR (RUBYFILL)
22.4400 | PACK | Freq: Once | INTRAVENOUS | Status: AC
  Administered 2024-08-19: 22.44 via INTRAVENOUS

## 2024-08-19 MED ORDER — REGADENOSON 0.4 MG/5ML IV SOLN
INTRAVENOUS | Status: AC
  Filled 2024-08-19: qty 5

## 2024-08-19 NOTE — Progress Notes (Signed)
 [address] [place] [address] Dept: [phone]  FOLLOW UP NOTE  Patient ID: [name], female    DOB: August 26, 1961  Age: 63 y.o. MRN: [id number] Date of Office Visit: 08/19/2024  Assessment  Chief Complaint: Follow-up (Asthma/Allergies /No concerns)  HPI [name] is a 63 year old female who presents today for follow-up of asthma-COPD overlap syndrome, chronic urticaria, mixed rhinitis with ear fullness and congestion, and gastroesophageal reflux disease.  She was last seen on May 21, 2024 by Dr. Iva.  She denies any new diagnosis or surgery since her last office visit.  She does mention that she has a cardio stress test at 11:00 this morning.  She also mentions that over the weekend her left arm started getting tight, she had weakness and then her arm contracted.  This happened on Saturday, Sunday, and the last time it occurred was on Monday.  Asthma-COPD overlap syndrome.  She continues to take Breztri  2 puffs twice a day, Ohtuvayre  nebulizer twice a day, Fasenra  injections every 8 weeks, azithromycin  3 times a week, and albuterol  as needed.  She reports a little bit of tightness this weekend.  She also reports that she will get a little bit of shortness of breath when she is around smoke or strong scents such as perfumes.  She reports that she lives in an apartment complex and is around a lot of smoke.  She also reports nocturnal awakenings where she is using pillows to sit up to sleep.  She reports that there trying to get her a sleep machine.  She denies coughing and wheezing.  She reports that she uses her albuterol  once a day and it helps.  She is a former smoker and reports that she quit smoking approximately 5 years ago.  She does feel like she is able to do more than she was before since being on Fasenra .  She was previously on Dupixent .  Since her last office visit she did see [facility] pulmonology on July 9 for a second opinion.  Her office visit  shows: Preliminary result   Pulmonary function test demonstrate very severe obstruction. FEV/FVC is only 35%. FEV1 is markedly reduced at 27% of predicted. There is evidence of significant hyperinflation with air trapping and diffusion capacity is also very reduced.  Radiographic review: Patient has moderate centrilobular emphysema. Lungs appear to be hyperinflated. Most recent CT scan I have is from 2024 and I did not see any nodules. There was no significant evidence of mucous plugging  Exercise oximetry: I ambulated with [name] in the hallway. She had very loud audible expiratory wheezing with minimal exertion. With pursed lip breathing the wheezing abated only to return when she stopped pursed lip breathing. We walked at a slow pace and she was able to ambulate for 6 minutes. At a faster pace she began wheezing. Her oxygen  saturation at rest was 99% with a nadir of 91%  Impression:  COPD: [name] has very severe obstructive airways disease. My strong suspicion is that this is primarily COPD. She has a history of asthma and is followed in both pulmonary and allergy  clinics. In reviewing her records I do not see evidence of significant eosinophilia and as best as I can tell her IgE levels have typically been within the normal range although she does have positive reactions to some allergens. Pulmonary function test have always shown very severe obstruction. I do not have bronchodilator response testing however she tells me that she feels that albuterol   is helpful.  Pulmonary function testing here today demonstrates very severe obstruction with hyperinflation. Diffusion capacity is also markedly reduced consistent with underlying emphysema. Notably her flow-volume loops demonstrate that her tidal flows are higher than her forced expiratory flows. This too would fit with severe dynamic airway collapse and is more typical of COPD than asthma.  Unfortunate [name] situation seems to  be severely complicated by underlying asthma and anxiety. Both she and her daughter reinforced that often when she is winded she will develop loud audible wheezing in association with her shortness of breath. This in turn often seems to make the shortness of breath worse. She has had experiences when she has called 911 where EMS has been able to improve breathing it sounds like through breathing exercises. At other times that she simply ends up in the hospital. In review of her records there has been some discussion of underlying vocal cord dysfunction. It does not sound like there has been paradoxical vocal cord movement.  Here in the clinic what I know is that when she does deep breathing forced exhalation she has very loud audible wheezing. When this occurred during her examination it clearly was somewhat anxiety provoking for her. At that point we practiced pursed lip breathing's and we were able to terminate the audible wheeze. She also developed this sort of wheezing when ambulating in the hallway. Again with pursed lip breathing we were able to terminate the wheeze and her exercise tolerance when walking at a slow pace seem to be reasonable.  At this point she appears to be on a very good COPD regimen. This includes Ohtuvayr as well as Breztri . She has a nebulizer machine that she uses liberally. Key intervention at this point would seem to be a combination of pulmonary rotation as well as paced breathing and management of anxiety and her tendency to panic.  Longer-term it is conceivable she could be a candidate for advanced therapies including bronchoscopic lung volume reduction. She does have emphysema and is severely hyperinflated by pulmonary function testing so it is possible she would be a candidate. Before that could be considered however she needs to do rehabilitation. Longer-term it is also possible she could be considered a candidate for lung transplantation although at this point her disease  does not seem to be severe enough (she is not oxygen  dependent) and it is unclear whether she will have adequate caregiver support for this very arduous undertaking. Certainly at this point she is only 63 years old so we have quite a bit of time to try to work through this possibility.  Plan: Continue your Breztri  2 puffs twice per day Continue the Ohtuvaqyre twice per day Continue as needed nebulizer Continue Singulair  Continue with your fasenra   You must go to pulmonary rehabilitation - [facility] - [phone] You must learn to do pursed lip breathing and employ this technique whenever your or your family hear wheezing and also employ it when you are doing your exercise. To learn how to do this you should Google the term 'pursed lip breathing and select the 'video' tab and then practice with the videos Return in 6 months but continue long term follow-up in [place]    Chronic urticaria: She reports that she just recently got the area on her elbows to go away with using triamcinolone .  She does take levocetirizine twice a day, famotidine  twice a day, and doxepin  25 mg as needed.  Mixed rhinitis with ear fullness and congestion: She denies  rhinorrhea, nasal congestion, and postnasal drip.  She has not been treated for any sinus infections since we last saw her.  She has Flonase  that she uses as needed.  Gastroesophageal reflux disease: She continues to take famotidine  daily and omeprazole  daily.  She reports her reflux has been pretty good.  She tries to watch what she eats and cut out spicy foods.  Drug Allergies:  Allergies  Allergen Reactions   Sulfa Antibiotics Nausea And Vomiting and Other (See Comments)   Venofer  [Iron  Sucrose] Hives    Review of Systems: Negative except as per HPI   Physical Exam: Pulse 71   Temp 97.9 F (36.6 C)   SpO2 95%    Physical Exam Constitutional:      Appearance: Normal appearance.  HENT:     Head: Normocephalic  and atraumatic.     Right Ear: Tympanic membrane, ear canal and external ear normal.     Left Ear: Tympanic membrane, ear canal and external ear normal.     Nose: Nose normal.     Mouth/Throat:     Mouth: Mucous membranes are moist.     Pharynx: Oropharynx is clear.  Eyes:     Conjunctiva/sclera: Conjunctivae normal.  Cardiovascular:     Rate and Rhythm: Regular rhythm.     Heart sounds: Normal heart sounds.  Pulmonary:     Comments: Lungs diminished throughout Musculoskeletal:     Cervical back: Neck supple.  Skin:    General: Skin is warm.  Neurological:     Mental Status: She is alert and oriented to person, place, and time.  Psychiatric:        Mood and Affect: Mood normal.        Behavior: Behavior normal.        Thought Content: Thought content normal.        Judgment: Judgment normal.     Diagnostics:  Will get spirometry at next office visit  Assessment and Plan: 1. Asthma with COPD with exacerbation (HCC)   2. Recurrent infections   3. Chronic urticaria   4. Perennial allergic rhinitis   5. Gastroesophageal reflux disease, unspecified whether esophagitis present      No orders of the defined types were placed in this encounter.   Patient Instructions  1. Asthma-COPD overlap syndrome - Continue to follow up with pulmonology - Daily controller medication(s): Breztri  two puffs twice daily + Ohtuvayre  nebulizer twice daily + Fasenra  every every 8 weeks  + azithromycin  three times weekly until we see you again (Mon/Wed/Fri). - Prior to physical activity: Ventolin  (GRAY INHALER) 2 puffs 10-15 minutes before physical activity. - Rescue medications: albuterol  nebulizer one vial every 4-6 hours as needed OR Ventolin  (GRAY INHALER) 2 puffs AND 2 puffs Atrovent  (GREEN INHALER) every 4-6 hours as needed - Asthma control goals:  * Full participation in all desired activities (may need albuterol  before activity) * Albuterol  use two time or less a week on average (not  counting use with activity) * Cough interfering with sleep two time or less a month * Oral steroids no more than once a year * No hospitalizations  2. Chronic urticaria -moderate control - Continue with levocetirizine twice daily EVERY DAY.  - Continue with famotidine  twice daily EVERY DAY.  - Continue with Doxepin  25mg  at night to help with itching AS NEEDED.  - Continue with triamcinolone  twice daily as needed for your lesion.   3. 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 - Continue to hold the allergy  shots.   4. GERD - Continue famotidine  daily . - Continue omeprazole  daily.  Recommend contacting your primary care physician today to discuss left arm weakness.Verbally instructed if occurs again to please go to the Emergency Room  5. Follow up in 3-4 months or [place]  Return in about 3 months (around 11/18/2024), or if symptoms worsen or fail to improve.    Thank you for the opportunity to care for this patient.  Please do not hesitate to contact me with questions.  Wanda Craze, FNP [facility] 

## 2024-08-19 NOTE — Progress Notes (Signed)
 Pulmonary Individual Treatment Plan  Patient Details  Name: [name] MRN: [id number] Date of Birth: Sep 10, 1961 Referring Provider:   Conrad Ports Pulmonary Rehab Walk Test from 07/10/2024 in [facility]  Referring Provider Dr. Fairy Danker ([facility])    Initial Encounter Date:  Flowsheet Row Pulmonary Rehab Walk Test from 07/10/2024 in [facility]  Date 07/10/24    Visit Diagnosis: Stage 3 severe COPD by GOLD classification (HCC)  Patient's Home Medications on Admission:   Current Outpatient Medications:    AIRSUPRA  90-80 MCG/ACT AERO, SMARTSIG:2 Puff(s) Via Inhaler 4 Times Daily PRN, Disp: , Rfl:    albuterol  (PROVENTIL ) (2.5 MG/3ML) 0.083% nebulizer solution, USE 1 VIAL VIA NEBULIZER EVERY 4 HOURS AS NEEDED FOR WHEEZING OR SHORTNESS OF BREATH, Disp: 75 mL, Rfl: 1   albuterol  (VENTOLIN  HFA) 108 (90 Base) MCG/ACT inhaler, INHALE 2 PUFFS INTO THE LUNGS EVERY 6 HOURS AS NEEDED FOR WHEEZING OR SHORTNESS OF BREATH, Disp: 18 g, Rfl: 1   amLODipine  (NORVASC ) 5 MG tablet, Take 1 tablet (5 mg total) by mouth daily., Disp: 90 tablet, Rfl: 3   ARIPiprazole  (ABILIFY ) 10 MG tablet, Take 1 tablet (10 mg total) by mouth daily. Take Abilify  20 mg po daily for 2 weeks till 9/17 , then continue taking 10 mg po daily, Disp: 30 tablet, Rfl: 1   Aspirin -Salicylamide-Caffeine  (BC HEADACHE POWDER PO), Take 1 packet by mouth daily., Disp: , Rfl:    atorvastatin  (LIPITOR) 80 MG tablet, Take 1 tablet (80 mg total) by mouth daily., Disp: 90 tablet, Rfl: 1   azelastine  (OPTIVAR ) 0.05 % ophthalmic solution, Place 1 drop into both eyes 2 (two) times daily., Disp: 6 mL, Rfl: 12   benralizumab  (FASENRA  PEN) 30 MG/ML prefilled autoinjector, Inject 1 mL (30 mg total) into the skin every 28 (twenty-eight) days. For 3 doses then every 8 weeks, Disp: 1 mL, Rfl: 9   BREZTRI  AEROSPHERE 160-9-4.8  MCG/ACT AERO inhaler, INHALE 2 PUFFS INTO THE LUNGS IN THE MORNING AND AT BEDTIME, Disp: 10.7 g, Rfl: 11   budesonide  (PULMICORT ) 0.5 MG/2ML nebulizer solution, Take 2 mLs (0.5 mg total) by nebulization in the morning and at bedtime., Disp: 120 mL, Rfl: 2   carbamazepine  (TEGRETOL ) 200 MG tablet, Take 2-3 tablets (400-600 mg total) by mouth See admin instructions. Take 400mg  in the morning and 600mg  at bedtime., Disp: 150 tablet, Rfl: 2   cetirizine  (ZYRTEC ) 10 MG tablet, Can take one tablet by mouth once daily if needed for runny nose., Disp: 30 tablet, Rfl: 5   cromolyn  (OPTICROM ) 4 % ophthalmic solution, Place 1 drop into both eyes 4 (four) times daily., Disp: 10 mL, Rfl: 12   doxepin  (SINEQUAN ) 25 MG capsule, TAKE 1 CAPSULE(25 MG) BY MOUTH AT BEDTIME AS NEEDED, Disp: 30 capsule, Rfl: 5   Ensifentrine  (OHTUVAYRE ) 3 MG/2.5ML SUSP, Inhale 3 mg into the lungs in the morning and at bedtime., Disp: 150 mL, Rfl: 11   estradiol  (ESTRACE ) 0.1 MG/GM vaginal cream, Place 1g nightly 2-3 times a week, Disp: 30 g, Rfl: 3   famotidine  (PEPCID ) 40 MG tablet, Take 1 tablet (40 mg total) by mouth 2 (two) times daily., Disp: 60 tablet, Rfl: 5   ferrous sulfate  325 (65 FE) MG EC tablet, TAKE 1 TABLET(325 MG) BY MOUTH DAILY WITH BREAKFAST, Disp: 90 tablet, Rfl: 1   fluticasone  (FLONASE ) 50 MCG/ACT nasal spray, SHAKE LIQUID AND USE 1  SPRAY IN EACH NOSTRIL TWICE DAILY AS NEEDED FOR ALLERGIES OR RHINITIS, Disp: 16 g, Rfl: 5   formoterol  (PERFOROMIST ) 20 MCG/2ML nebulizer solution, Take 20 mcg by nebulization 2 (two) times daily., Disp: , Rfl:    furosemide  (LASIX ) 20 MG tablet, TAKE 1 TABLET BY MOUTH DAILY AS NEEDED, Disp: 30 tablet, Rfl: 5   hydrOXYzine  (VISTARIL ) 25 MG capsule, Take one tablet every 6-8 hours as needed for itching., Disp: 30 capsule, Rfl: 1   ipratropium (ATROVENT  HFA) 17 MCG/ACT inhaler, Inhale 2 puffs into the lungs every 4 (four) hours as needed for wheezing., Disp: 1 each, Rfl: 5   irbesartan   (AVAPRO ) 150 MG tablet, Take 1 tablet (150 mg total) by mouth daily., Disp: 90 tablet, Rfl: 3   levocetirizine (XYZAL ) 5 MG tablet, Take 1 tablet (5 mg total) by mouth in the morning and at bedtime., Disp: 60 tablet, Rfl: 5   montelukast  (SINGULAIR ) 10 MG tablet, TAKE 1 TABLET(10 MG) BY MOUTH AT BEDTIME, Disp: 90 tablet, Rfl: 1   Nebulizers (PARI PRONEB MAX LC SPRINT) MISC, , Disp: , Rfl:    omeprazole  (PRILOSEC) 40 MG capsule, Take 1 capsule (40 mg total) by mouth daily., Disp: 90 capsule, Rfl: 3   ondansetron  (ZOFRAN -ODT) 4 MG disintegrating tablet, DISSOLVE 1 TABLET(4 MG) ON THE TONGUE EVERY 8 HOURS AS NEEDED FOR NAUSEA OR VOMITING, Disp: 30 tablet, Rfl: 1   Skin Protectants, Misc. (EUCERIN) cream, Apply 1 Application topically daily as needed for dry skin., Disp: , Rfl:    tacrolimus  (PROTOPIC ) 0.03 % ointment, Apply 1 Application topically 2 (two) times daily as needed (rash). (Patient taking differently: Apply 1 Application topically 2 (two) times daily as needed (recurrent rash).), Disp: 60 g, Rfl: 5   triamcinolone  ointment (KENALOG ) 0.1 %, Apply 1 Application topically at bedtime., Disp: 30 g, Rfl: 5   Vibegron  (GEMTESA ) 75 MG TABS, Take 1 tablet (75 mg total) by mouth daily., Disp: 30 tablet, Rfl: 11  Current Facility-Administered Medications:    benralizumab  (FASENRA ) prefilled syringe 30 mg, 30 mg, Subcutaneous, Q8 Weeks, Iva Marty Saltness, MD, 30 mg at 07/23/24 1337  Past Medical History: Past Medical History:  Diagnosis Date   Anemia, iron  deficiency 12/22/2014   pt. 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      Bipolar disorder (HCC)    with anxiety, depression   COPD (chronic obstructive pulmonary disease) (HCC)    Eczema 05/18/2022   Fibromyalgia 05/14/2014   GERD (gastroesophageal  reflux disease)    History of kidney stones    Hyperlipidemia 04/20/2017   HYPERTENSION 05/12/2009   Qualifier: Diagnosis of  By: Primus LATHER LEODIS), [name]     Peripheral vascular disease (HCC)    Pneumonia    Prediabetes 02/23/2014   pt. denies   Seizure (HCC)    Stroke (HCC) 11/2020   Urticaria    Vitamin D  deficiency     Tobacco Use: Social History   Tobacco Use  Smoking Status Former   Current packs/day: 0.00   Average packs/day: 0.5 packs/day for 40.0 years (20.0 ttl pk-yrs)   Types: Cigarettes   Start date: 2   Quit date: 04/2018   Years since quitting: 6.3   Passive exposure: Never  Smokeless Tobacco Never  Tobacco Comments   Patient states her quit date was  05/09/2018.  Patient educated with resources at today's appointment to continue to support her to stop smoking.    Labs: Review Flowsheet  More data exists      Latest Ref Rng & Units 06/18/2023 06/23/2023 08/11/2023 08/12/2023 12/25/2023  Labs for ITP Cardiac and Pulmonary Rehab  Cholestrol 0 - 200 mg/dL 795  - - - 828   LDL (calc) 0 - 99 mg/dL 888  - - - 80   HDL-C >60.99 mg/dL 23.19  - - - 22.49   Trlycerides 0.0 - 149.0 mg/dL 17.9  - - - 35.9   Hemoglobin A1c 4.6 - 6.5 % 5.9  - - - 6.0   Bicarbonate 20.0 - 28.0 mmol/L - 34.7  26.2  24.7  -  TCO2 22 - 32 mmol/L - 30  36  27  25  -  O2 Saturation % - 87  97  98  -    Details       Multiple values from one day are sorted in reverse-chronological order         Capillary Blood Glucose: Lab Results  Component Value Date   GLUCAP 128 (H) 08/06/2024   GLUCAP 108 (H) 08/20/2023   GLUCAP 99 08/20/2023   GLUCAP 126 (H) 08/19/2023   GLUCAP 103 (H) 08/19/2023     Pulmonary Assessment Scores:  Pulmonary Assessment Scores     Row Name 07/10/24 1145         ADL UCSD   ADL Phase Entry     SOB Score total 88       CAT Score   CAT Score 32       mMRC Score   mMRC Score 3       UCSD: Self-administered rating of dyspnea associated with  activities of daily living (ADLs) 6-point scale (0 = not at all to 5 = maximal or unable to do because of breathlessness)  Scoring Scores range from 0 to 120.  Minimally important difference is 5 units  CAT: CAT can identify the health impairment of COPD patients and is better correlated with disease progression.  CAT has a scoring range of zero to 40. The CAT score is classified into four groups of low (less than 10), medium (10 - 20), high (21-30) and very high (31-40) based on the impact level of disease on health status. A CAT score over 10 suggests significant symptoms.  A worsening CAT score could be explained by an exacerbation, poor medication adherence, poor inhaler technique, or progression of COPD or comorbid conditions.  CAT MCID is 2 points  mMRC: mMRC (Modified Medical Research Council) Dyspnea Scale is used to assess the degree of baseline functional disability in patients of respiratory disease due to dyspnea. No minimal important difference is established. A decrease in score of 1 point or greater is considered a positive change.   Pulmonary Function Assessment:  Pulmonary Function Assessment - 07/10/24 1145       Breath   Bilateral Breath Sounds Clear    Shortness of Breath Yes;Fear of Shortness of Breath;Limiting activity;Panic with Shortness of Breath          Exercise Target Goals: Exercise Program Goal: Individual exercise prescription set using results from initial 6 min walk test and THRR while considering  patient's activity barriers and safety.   Exercise Prescription Goal: Initial exercise prescription builds to 30-45 minutes a day of aerobic activity, 2-3 days per week.  Home exercise guidelines will be given to patient during program as  part of exercise prescription that the participant will acknowledge.  Activity Barriers & Risk Stratification:  Activity Barriers & Cardiac Risk Stratification - 07/10/24 1028       Activity Barriers & Cardiac Risk  Stratification   Activity Barriers History of Falls;Balance Concerns;Deconditioning;Muscular Weakness;Shortness of Breath;Back Problems;Assistive Device          6 Minute Walk:  6 Minute Walk     Row Name 07/10/24 1130         6 Minute Walk   Phase Initial     Distance 1020 feet     Walk Time 6 minutes     # of Rest Breaks 1  desat to 85%, 2:36-2:44 92% after PLB     MPH 1.93     METS 2.65     RPE 11     Perceived Dyspnea  1     VO2 Peak 9.27     Symptoms Yes (comment)     Comments dyspnea     Resting HR 77 bpm     Resting BP 130/74     Resting Oxygen  Saturation  98 %     Exercise Oxygen  Saturation  during 6 min walk 85 %  O2 dropped to 85%, PLB up to 92%     Max Ex. HR 104 bpm     Max Ex. BP 144/80     2 Minute Post BP 120/76       Interval HR   1 Minute HR 93     2 Minute HR 99     3 Minute HR 101     4 Minute HR 102     5 Minute HR 101     6 Minute HR 104     2 Minute Post HR 72     Interval Heart Rate? Yes       Interval Oxygen    Interval Oxygen ? Yes     Baseline Oxygen  Saturation % 98 %     1 Minute Oxygen  Saturation % 95 %     1 Minute Liters of Oxygen  0 L     2 Minute Oxygen  Saturation % 88 %  2:36-2:44, desat to 85%, PLB and back up to 92%     2 Minute Liters of Oxygen  0 L     3 Minute Oxygen  Saturation % 92 %     3 Minute Liters of Oxygen  0 L     4 Minute Oxygen  Saturation % 96 %     4 Minute Liters of Oxygen  0 L     5 Minute Oxygen  Saturation % 95 %     5 Minute Liters of Oxygen  0 L     6 Minute Oxygen  Saturation % 98 %     6 Minute Liters of Oxygen  0 L     2 Minute Post Oxygen  Saturation % 97 %     2 Minute Post Liters of Oxygen  0 L        Oxygen  Initial Assessment:  Oxygen  Initial Assessment - 07/10/24 1030       Home Oxygen    Home Oxygen  Device None    Sleep Oxygen  Prescription None    Home Exercise Oxygen  Prescription None    Home Resting Oxygen  Prescription None      Initial 6 min Walk   Oxygen  Used None      Program Oxygen   Prescription   Program Oxygen  Prescription None      Intervention   Short Term Goals To learn and  understand importance of maintaining oxygen  saturations>88%;To learn and understand importance of monitoring SPO2 with pulse oximeter and demonstrate accurate use of the pulse oximeter.;To learn and demonstrate proper pursed lip breathing techniques or other breathing techniques. ;To learn and demonstrate proper use of respiratory medications;To learn and exhibit compliance with exercise, home and travel O2 prescription    Long  Term Goals Exhibits compliance with exercise, home  and travel O2 prescription;Maintenance of O2 saturations>88%;Verbalizes importance of monitoring SPO2 with pulse oximeter and return demonstration;Exhibits proper breathing techniques, such as pursed lip breathing or other method taught during program session;Demonstrates proper use of MDI's;Compliance with respiratory medication          Oxygen  Re-Evaluation:  Oxygen  Re-Evaluation     Row Name 07/16/24 1545 08/12/24 0912           Program Oxygen  Prescription   Program Oxygen  Prescription None None        Home Oxygen    Home Oxygen  Device None None      Sleep Oxygen  Prescription None None      Home Exercise Oxygen  Prescription None None      Home Resting Oxygen  Prescription None None        Goals/Expected Outcomes   Short Term Goals To learn and understand importance of maintaining oxygen  saturations>88%;To learn and understand importance of monitoring SPO2 with pulse oximeter and demonstrate accurate use of the pulse oximeter.;To learn and demonstrate proper pursed lip breathing techniques or other breathing techniques. ;To learn and demonstrate proper use of respiratory medications;To learn and exhibit compliance with exercise, home and travel O2 prescription To learn and understand importance of maintaining oxygen  saturations>88%;To learn and understand importance of monitoring SPO2 with pulse oximeter and  demonstrate accurate use of the pulse oximeter.;To learn and demonstrate proper pursed lip breathing techniques or other breathing techniques. ;To learn and demonstrate proper use of respiratory medications;To learn and exhibit compliance with exercise, home and travel O2 prescription      Long  Term Goals Exhibits compliance with exercise, home  and travel O2 prescription;Maintenance of O2 saturations>88%;Verbalizes importance of monitoring SPO2 with pulse oximeter and return demonstration;Exhibits proper breathing techniques, such as pursed lip breathing or other method taught during program session;Demonstrates proper use of MDI's;Compliance with respiratory medication Exhibits compliance with exercise, home  and travel O2 prescription;Maintenance of O2 saturations>88%;Verbalizes importance of monitoring SPO2 with pulse oximeter and return demonstration;Exhibits proper breathing techniques, such as pursed lip breathing or other method taught during program session;Demonstrates proper use of MDI's;Compliance with respiratory medication      Goals/Expected Outcomes Compliance and understanding of oxygen  saturation monitoring and breathing techniques to decrease shortness of breath. Compliance and understanding of oxygen  saturation monitoring and breathing techniques to decrease shortness of breath.         Oxygen  Discharge (Final Oxygen  Re-Evaluation):  Oxygen  Re-Evaluation - 08/12/24 0912       Program Oxygen  Prescription   Program Oxygen  Prescription None      Home Oxygen    Home Oxygen  Device None    Sleep Oxygen  Prescription None    Home Exercise Oxygen  Prescription None    Home Resting Oxygen  Prescription None      Goals/Expected Outcomes   Short Term Goals To learn and understand importance of maintaining oxygen  saturations>88%;To learn and understand importance of monitoring SPO2 with pulse oximeter and demonstrate accurate use of the pulse oximeter.;To learn and demonstrate proper pursed  lip breathing techniques or other breathing techniques. ;To learn and demonstrate proper use of respiratory medications;To  learn and exhibit compliance with exercise, home and travel O2 prescription    Long  Term Goals Exhibits compliance with exercise, home  and travel O2 prescription;Maintenance of O2 saturations>88%;Verbalizes importance of monitoring SPO2 with pulse oximeter and return demonstration;Exhibits proper breathing techniques, such as pursed lip breathing or other method taught during program session;Demonstrates proper use of MDI's;Compliance with respiratory medication    Goals/Expected Outcomes Compliance and understanding of oxygen  saturation monitoring and breathing techniques to decrease shortness of breath.          Initial Exercise Prescription:  Initial Exercise Prescription - 07/10/24 1100       Date of Initial Exercise RX and Referring Provider   Date 07/10/24    Referring Provider Dr. Fairy Danker ([facility])    Expected Discharge Date 07/10/24      NuStep   Level 2    SPM 60    Minutes 15    METs 1.8      Track   Laps 15    Minutes 15    METs 2.04      Prescription Details   Frequency (times per week) 2    Duration Progress to 30 minutes of continuous aerobic without signs/symptoms of physical distress      Intensity   THRR 40-80% of Max Heartrate 64-127    Ratings of Perceived Exertion 11-13    Perceived Dyspnea 0-4      Progression   Progression Continue to progress workloads to maintain intensity without signs/symptoms of physical distress.      Resistance Training   Training Prescription Yes    Weight red bands    Reps 10-15          Perform Capillary Blood Glucose checks as needed.  Exercise Prescription Changes:   Exercise Prescription Changes     Row Name 07/21/24 1100 08/04/24 1200 08/18/24 1200         Response to Exercise   Blood Pressure (Admit) 112/70 120/70 132/68     Blood Pressure (Exercise) 118/70 128/70 142/70      Blood Pressure (Exit) 104/64 104/68 128/78     Heart Rate (Admit) 85 bpm 80 bpm 95 bpm     Heart Rate (Exercise) 104 bpm 93 bpm 88 bpm     Heart Rate (Exit) 86 bpm 68 bpm 82 bpm     Oxygen  Saturation (Admit) 94 % 98 % 96 %     Oxygen  Saturation (Exercise) 94 % 94 % 96 %     Oxygen  Saturation (Exit) 95 % 97 % 98 %     Rating of Perceived Exertion (Exercise) 11 10 10      Perceived Dyspnea (Exercise) 2 1 1      Duration Progress to 30 minutes of  aerobic without signs/symptoms of physical distress Progress to 30 minutes of  aerobic without signs/symptoms of physical distress Continue with 30 min of aerobic exercise without signs/symptoms of physical distress.     Intensity THRR unchanged THRR unchanged THRR unchanged       Progression   Progression Continue to progress workloads to maintain intensity without signs/symptoms of physical distress. Continue to progress workloads to maintain intensity without signs/symptoms of physical distress. Continue to progress workloads to maintain intensity without signs/symptoms of physical distress.       Resistance Training   Training Prescription Yes Yes Yes     Weight red bands red bands red bands     Reps 10-15 10-15 10-15     Time 10 Minutes 10  Minutes 10 Minutes       Interval Training   Interval Training No -- --       NuStep   Level 2 2 2      SPM 67 78 80     Minutes 15 215 15     METs 1.8 2.1 2.1       Track   Laps 18 21 16      Minutes 15 15 15      METs 2.25 2.46 2.1        Exercise Comments:   Exercise Comments     Row Name 07/16/24 1150           Exercise Comments Pt completed her first day of group exercise. She exercised on the recumbent stepper for 15 min, level 2, METs 1.7. She then walked the track for 15 min, 1.84 METs. She felt dizzy and tired walking and needed rest stops. Encouraged hydration. Performed warm up and cool down standing including squats. Discussed METs with fair reception.          Exercise Goals and  Review:   Exercise Goals     Row Name 07/10/24 1029             Exercise Goals   Increase Physical Activity Yes       Intervention Provide advice, education, support and counseling about physical activity/exercise needs.;Develop an individualized exercise prescription for aerobic and resistive training based on initial evaluation findings, risk stratification, comorbidities and participant's personal goals.       Expected Outcomes Short Term: Attend rehab on a regular basis to increase amount of physical activity.;Long Term: Exercising regularly at least 3-5 days a week.;Long Term: Add in home exercise to make exercise part of routine and to increase amount of physical activity.       Increase Strength and Stamina Yes       Intervention Provide advice, education, support and counseling about physical activity/exercise needs.;Develop an individualized exercise prescription for aerobic and resistive training based on initial evaluation findings, risk stratification, comorbidities and participant's personal goals.       Expected Outcomes Short Term: Increase workloads from initial exercise prescription for resistance, speed, and METs.;Short Term: Perform resistance training exercises routinely during rehab and add in resistance training at home;Long Term: Improve cardiorespiratory fitness, muscular endurance and strength as measured by increased METs and functional capacity ( )       Able to understand and use rate of perceived exertion (RPE) scale Yes       Intervention Provide education and explanation on how to use RPE scale       Expected Outcomes Short Term: Able to use RPE daily in rehab to express subjective intensity level;Long Term:  Able to use RPE to guide intensity level when exercising independently       Able to understand and use Dyspnea scale Yes       Intervention Provide education and explanation on how to use Dyspnea scale       Expected Outcomes Short Term: Able to use  Dyspnea scale daily in rehab to express subjective sense of shortness of breath during exertion;Long Term: Able to use Dyspnea scale to guide intensity level when exercising independently       Knowledge and understanding of Target Heart Rate Range (THRR) Yes       Intervention Provide education and explanation of THRR including how the numbers were predicted and where they are located for reference  Expected Outcomes Short Term: Able to state/look up THRR;Short Term: Able to use daily as guideline for intensity in rehab;Long Term: Able to use THRR to govern intensity when exercising independently       Understanding of Exercise Prescription Yes       Intervention Provide education, explanation, and written materials on patient's individual exercise prescription       Expected Outcomes Short Term: Able to explain program exercise prescription;Long Term: Able to explain home exercise prescription to exercise independently          Exercise Goals Re-Evaluation :  Exercise Goals Re-Evaluation     Row Name 07/16/24 1551 08/12/24 0910           Exercise Goal Re-Evaluation   Exercise Goals Review Increase Physical Activity;Able to understand and use Dyspnea scale;Understanding of Exercise Prescription;Increase Strength and Stamina;Knowledge and understanding of Target Heart Rate Range (THRR);Able to understand and use rate of perceived exertion (RPE) scale Increase Physical Activity;Able to understand and use Dyspnea scale;Understanding of Exercise Prescription;Increase Strength and Stamina;Knowledge and understanding of Target Heart Rate Range (THRR);Able to understand and use rate of perceived exertion (RPE) scale      Comments Devota has completed 1 exercise sessions. She exercises for 15 min on Nustep and track. She averages 1.7 METs at level 2 on the Nustep and 1.84 METs on the track. She performed the warmup and cooldown standing without limitaions. [place] shows some deconditioning. Will  continue to monitor and progress as able. Eastyn has completed 6 exercise sessions. She exercises for 15 min on Nustep and track. She averages 1.9 METs at level 2 on the Nustep and 2.18 METs on the track. She performs the warmup and cooldown standing without limitations. Ivan has increased her track laps. She seems very motivated to exercise. Will continue to monitor and progress as able.      Expected Outcomes Through exercise at rehab and home, the patient will decrease shortness of breath with daily activities and feel confident in carrying out an exercise regimen at home. Through exercise at rehab and home, the patient will decrease shortness of breath with daily activities and feel confident in carrying out an exercise regimen at home.         Discharge Exercise Prescription (Final Exercise Prescription Changes):  Exercise Prescription Changes - 08/18/24 1200       Response to Exercise   Blood Pressure (Admit) 132/68    Blood Pressure (Exercise) 142/70    Blood Pressure (Exit) 128/78    Heart Rate (Admit) 95 bpm    Heart Rate (Exercise) 88 bpm    Heart Rate (Exit) 82 bpm    Oxygen  Saturation (Admit) 96 %    Oxygen  Saturation (Exercise) 96 %    Oxygen  Saturation (Exit) 98 %    Rating of Perceived Exertion (Exercise) 10    Perceived Dyspnea (Exercise) 1    Duration Continue with 30 min of aerobic exercise without signs/symptoms of physical distress.    Intensity THRR unchanged      Progression   Progression Continue to progress workloads to maintain intensity without signs/symptoms of physical distress.      Resistance Training   Training Prescription Yes    Weight red bands    Reps 10-15    Time 10 Minutes      NuStep   Level 2    SPM 80    Minutes 15    METs 2.1      Track   Laps 16  Minutes 15    METs 2.1          Nutrition:  Target Goals: Understanding of nutrition guidelines, daily intake of sodium 1500mg , cholesterol 200mg , calories 30% from fat and  7% or less from saturated fats, daily to have 5 or more servings of fruits and vegetables.  Biometrics:  Pre Biometrics - 07/10/24 1020       Pre Biometrics   Grip Strength 18 kg   new dynomometer          Nutrition Therapy Plan and Nutrition Goals:  Nutrition Therapy & Goals - 07/17/24 1557       Nutrition Therapy   Diet General Healthy Diet    Drug/Food Interactions Statins/Certain Fruits      Personal Nutrition Goals   Nutrition Goal Patient to improve diet quality by using the plate method as a guide for meal planning to include lean protein/plant protein, fruits, vegetables, whole grains, nonfat dairy as part of a well-balanced diet.    Comments Patient has medical history of HTN, peripheral vascular disease, hyperlipidemia, former smoker, prediabetes, cirrhosis, COPD3. LDL is well controlled. A1c remains in a prediabetic range. Liver enzymes are WNL.  She reports following a reguarly diet and reports that she has been working on diet and exercise to aid with weight loss. Patient will benefit from participation in pulmonary rehab for nutrition education, exercise, and lifestyle modification.      Intervention Plan   Intervention Prescribe, educate and counsel regarding individualized specific dietary modifications aiming towards targeted core components such as weight, hypertension, lipid management, diabetes, heart failure and other comorbidities.;Nutrition handout(s) given to patient.    Expected Outcomes Short Term Goal: Understand basic principles of dietary content, such as calories, fat, sodium, cholesterol and nutrients.;Long Term Goal: Adherence to prescribed nutrition plan.          Nutrition Assessments:  MEDIFICTS Score Key: >=70 Need to make dietary changes  40-70 Heart Healthy Diet <= 40 Therapeutic Level Cholesterol Diet   Picture Your Plate Scores: <59 Unhealthy dietary pattern with much room for improvement. 41-50 Dietary pattern unlikely to meet  recommendations for good health and room for improvement. 51-60 More healthful dietary pattern, with some room for improvement.  >60 Healthy dietary pattern, although there may be some specific behaviors that could be improved.    Nutrition Goals Re-Evaluation:  Nutrition Goals Re-Evaluation     Row Name 07/17/24 1557             Goals   Current Weight 191 lb 9.3 oz (86.9 kg)       Comment hemoglobin 10, A1c 6.0, LDL 80, HDL 77       Expected Outcome Patient has medical history of HTN, peripheral vascular disease, hyperlipidemia, former smoker, prediabetes, cirrhosis, COPD3. LDL is well controlled. A1c remains in a prediabetic range. Liver enzymes are WNL. She reports following a reguarly diet and reports that she has been working on diet and exercise to aid with weight loss. Will continue to monitor weight and discuss strategies for weight loss. Patient will benefit from participation in pulmonary rehab for nutrition education, exercise, and lifestyle modification.          Nutrition Goals Discharge (Final Nutrition Goals Re-Evaluation):  Nutrition Goals Re-Evaluation - 07/17/24 1557       Goals   Current Weight 191 lb 9.3 oz (86.9 kg)    Comment hemoglobin 10, A1c 6.0, LDL 80, HDL 77    Expected Outcome Patient has medical history of  HTN, peripheral vascular disease, hyperlipidemia, former smoker, prediabetes, cirrhosis, COPD3. LDL is well controlled. A1c remains in a prediabetic range. Liver enzymes are WNL. She reports following a reguarly diet and reports that she has been working on diet and exercise to aid with weight loss. Will continue to monitor weight and discuss strategies for weight loss. Patient will benefit from participation in pulmonary rehab for nutrition education, exercise, and lifestyle modification.          Psychosocial: Target Goals: Acknowledge presence or absence of significant depression and/or stress, maximize coping skills, provide positive support  system. Participant is able to verbalize types and ability to use techniques and skills needed for reducing stress and depression.  Initial Review & Psychosocial Screening:  Initial Psych Review & Screening - 07/10/24 1030       Initial Review   Current issues with History of Depression;Current Stress Concerns    Source of Stress Concerns Family;Chronic Illness      Family Dynamics   Good Support System? Yes    Concerns Recent loss of significant other      Barriers   Psychosocial barriers to participate in program The patient should benefit from training in stress management and relaxation.;Psychosocial barriers identified (see note)      Screening Interventions   Interventions Encouraged to exercise;Program counselor consult;To provide support and resources with identified psychosocial needs;Provide feedback about the scores to participant    Expected Outcomes Short Term goal: Utilizing psychosocial counselor, staff and physician to assist with identification of specific Stressors or current issues interfering with healing process. Setting desired goal for each stressor or current issue identified.;Long Term Goal: Stressors or current issues are controlled or eliminated.;Short Term goal: Identification and review with participant of any Quality of Life or Depression concerns found by scoring the questionnaire.;Long Term goal: The participant improves quality of Life and PHQ9 Scores as seen by post scores and/or verbalization of changes          Quality of Life Scores:  Scores of 19 and below usually indicate a poorer quality of life in these areas.  A difference of  2-3 points is a clinically meaningful difference.  A difference of 2-3 points in the total score of the Quality of Life Index has been associated with significant improvement in overall quality of life, self-image, physical symptoms, and general health in studies assessing change in quality of life.  PHQ-9: Review  Flowsheet  More data exists      07/10/2024 03/25/2024 03/06/2024 12/25/2023 12/02/2023  Depression screen PHQ 2/9  Decreased Interest 1 0 0 0 0 0  Down, Depressed, Hopeless 1 0 0 0 0 0  PHQ - 2 Score 2 0 0 0 0 0  Altered sleeping 1 - - 0 0  Tired, decreased energy 1 - - - 0  Change in appetite 1 - - 0 0  Feeling bad or failure about yourself  0 - - 0 0  Trouble concentrating 1 - - 0 0  Moving slowly or fidgety/restless 0 - - 0 0  Suicidal thoughts 0 - - 0 0  PHQ-9 Score 6 - - 0 0  Difficult doing work/chores Somewhat difficult - - Not difficult at all Not difficult at all    Details       Multiple values from one day are sorted in reverse-chronological order        Interpretation of Total Score  Total Score Depression Severity:  1-4 = Minimal depression, 5-9 = Mild depression,  10-14 = Moderate depression, 15-19 = Moderately severe depression, 20-27 = Severe depression   Psychosocial Evaluation and Intervention:  Psychosocial Evaluation - 07/10/24 1031       Psychosocial Evaluation & Interventions   Interventions Therapist referral;Encouraged to exercise with the program and follow exercise prescription    Continue Psychosocial Services  Follow up required by counselor          Psychosocial Re-Evaluation:  Psychosocial Re-Evaluation     Row Name 07/17/24 1249 08/12/24 0947           Psychosocial Re-Evaluation   Current issues with History of Depression;Current Stress Concerns History of Depression;Current Stress Concerns      Comments Oscar has attended one session so far. She denies any new psychosocial barriers or concerns at this time. Referral was placed to a mental health specialist. Robbin denies any new psychosocial barriers or concerns at this time. She is still dealing with the loss of her significant other. She enjoy's coming to class and has made some friends in class. No needs at this time.      Expected Outcomes For Maureena to have less stress while  participating in the program. For Danisha to have less stress while participating in the program.      Interventions Encouraged to attend Pulmonary Rehabilitation for the exercise Encouraged to attend Pulmonary Rehabilitation for the exercise;Stress management education;Relaxation education      Continue Psychosocial Services  Follow up required by staff Follow up required by staff         Psychosocial Discharge (Final Psychosocial Re-Evaluation):  Psychosocial Re-Evaluation - 08/12/24 0947       Psychosocial Re-Evaluation   Current issues with History of Depression;Current Stress Concerns    Comments Lynore denies any new psychosocial barriers or concerns at this time. She is still dealing with the loss of her significant other. She enjoy's coming to class and has made some friends in class. No needs at this time.    Expected Outcomes For Norris to have less stress while participating in the program.    Interventions Encouraged to attend Pulmonary Rehabilitation for the exercise;Stress management education;Relaxation education    Continue Psychosocial Services  Follow up required by staff          Education: Education Goals: Education classes will be provided on a weekly basis, covering required topics. Participant will state understanding/return demonstration of topics presented.  Learning Barriers/Preferences:  Learning Barriers/Preferences - 07/10/24 1032       Learning Barriers/Preferences   Learning Barriers None    Learning Preferences None          Education Topics: Know Your Numbers Group instruction that is supported by a PowerPoint presentation. Instructor discusses importance of knowing and understanding resting, exercise, and post-exercise oxygen  saturation, heart rate, and blood pressure. Oxygen  saturation, heart rate, blood pressure, rating of perceived exertion, and dyspnea are reviewed along with a normal range for these values.    Exercise for the  Pulmonary Patient Group instruction that is supported by a PowerPoint presentation. Instructor discusses benefits of exercise, core components of exercise, frequency, duration, and intensity of an exercise routine, importance of utilizing pulse oximetry during exercise, safety while exercising, and options of places to exercise outside of rehab.    MET Level  Group instruction provided by PowerPoint, verbal discussion, and written material to support subject matter. Instructor reviews what METs are and how to increase METs.  Flowsheet Row PULMONARY REHAB CHRONIC OBSTRUCTIVE PULMONARY DISEASE from 07/30/2024 in [place]  [facility]  Date 07/30/24  Educator EP  Instruction Review Code 1- Verbalizes Understanding    Pulmonary Medications Verbally interactive group education provided by instructor with focus on inhaled medications and proper administration.   Anatomy and Physiology of the Respiratory System Group instruction provided by PowerPoint, verbal discussion, and written material to support subject matter. Instructor reviews respiratory cycle and anatomical components of the respiratory system and their functions. Instructor also reviews differences in obstructive and restrictive respiratory diseases with examples of each.    Oxygen  Safety Group instruction provided by PowerPoint, verbal discussion, and written material to support subject matter. There is an overview of "What is Oxygen " and "Why do we need it".  Instructor also reviews how to create a safe environment for oxygen  use, the importance of using oxygen  as prescribed, and the risks of noncompliance. There is a brief discussion on traveling with oxygen  and resources the patient may utilize.   Oxygen  Use Group instruction provided by PowerPoint, verbal discussion, and written material to discuss how supplemental oxygen  is prescribed and different types of oxygen  supply systems.  Resources for more information are provided.    Breathing Techniques Group instruction that is supported by demonstration and informational handouts. Instructor discusses the benefits of pursed lip and diaphragmatic breathing and detailed demonstration on how to perform both.     Risk Factor Reduction Group instruction that is supported by a PowerPoint presentation. Instructor discusses the definition of a risk factor, different risk factors for pulmonary disease, and how the heart and lungs work together.   Pulmonary Diseases Group instruction provided by PowerPoint, verbal discussion, and written material to support subject matter. Instructor gives an overview of the different type of pulmonary diseases. There is also a discussion on risk factors and symptoms as well as ways to manage the diseases. Flowsheet Row PULMONARY REHAB CHRONIC OBSTRUCTIVE PULMONARY DISEASE from 08/06/2024 in [facility] Heart, Vascular, & Lung Health  Date 08/06/24  Educator RT  Instruction Review Code 1- Verbalizes Understanding    Stress and Energy Conservation Group instruction provided by PowerPoint, verbal discussion, and written material to support subject matter. Instructor gives an overview of stress and the impact it can have on the body. Instructor also reviews ways to reduce stress. There is also a discussion on energy conservation and ways to conserve energy throughout the day.   Warning Signs and Symptoms Group instruction provided by PowerPoint, verbal discussion, and written material to support subject matter. Instructor reviews warning signs and symptoms of stroke, heart attack, cold and flu. Instructor also reviews ways to prevent the spread of infection. Flowsheet Row PULMONARY REHAB CHRONIC OBSTRUCTIVE PULMONARY DISEASE from 07/16/2024 in [facility] for Heart, Vascular, & Lung Health  Date 07/16/24  Educator RN  Instruction Review Code 1-  Verbalizes Understanding    Other Education Group or individual verbal, written, or video instructions that support the educational goals of the pulmonary rehab program.    Knowledge Questionnaire Score:  Knowledge Questionnaire Score - 07/10/24 1147       Knowledge Questionnaire Score   Pre Score 11/18          Core Components/Risk Factors/Patient Goals at Admission:  Personal Goals and Risk Factors at Admission - 07/10/24 1032       Core Components/Risk Factors/Patient Goals on Admission    Weight Management Yes;Weight Loss    Intervention Weight Management: Develop a combined nutrition and exercise  program designed to reach desired caloric intake, while maintaining appropriate intake of nutrient and fiber, sodium and fats, and appropriate energy expenditure required for the weight goal.;Weight Management: Provide education and appropriate resources to help participant work on and attain dietary goals.;Weight Management/Obesity: Establish reasonable short term and long term weight goals.;Obesity: Provide education and appropriate resources to help participant work on and attain dietary goals.    Expected Outcomes Short Term: Continue to assess and modify interventions until short term weight is achieved;Long Term: Adherence to nutrition and physical activity/exercise program aimed toward attainment of established weight goal;Weight Loss: Understanding of general recommendations for a balanced deficit meal plan, which promotes 1-2 lb weight loss per week and includes a negative energy balance of [phone] kcal/d;Understanding recommendations for meals to include 15-35% energy as protein, 25-35% energy from fat, 35-60% energy from carbohydrates, less than 200mg  of dietary cholesterol, 20-35 gm of total fiber daily;Understanding of distribution of calorie intake throughout the day with the consumption of 4-5 meals/snacks    Improve shortness of breath with ADL's Yes    Intervention Provide  education, individualized exercise plan and daily activity instruction to help decrease symptoms of SOB with activities of daily living.    Expected Outcomes Short Term: Improve cardiorespiratory fitness to achieve a reduction of symptoms when performing ADLs;Long Term: Be able to perform more ADLs without symptoms or delay the onset of symptoms    Stress Yes    Intervention Offer individual and/or small group education and counseling on adjustment to heart disease, stress management and health-related lifestyle change. Teach and support self-help strategies.;Refer participants experiencing significant psychosocial distress to appropriate mental health specialists for further evaluation and treatment. When possible, include family members and significant others in education/counseling sessions.    Expected Outcomes Short Term: Participant demonstrates changes in health-related behavior, relaxation and other stress management skills, ability to obtain effective social support, and compliance with psychotropic medications if prescribed.;Long Term: Emotional wellbeing is indicated by absence of clinically significant psychosocial distress or social isolation.          Core Components/Risk Factors/Patient Goals Review:   Goals and Risk Factor Review     Row Name 07/17/24 1253 08/12/24 0952           Core Components/Risk Factors/Patient Goals Review   Personal Goals Review Weight Management/Obesity;Improve shortness of breath with ADL's;Develop more efficient breathing techniques such as purse lipped breathing and diaphragmatic breathing and practicing self-pacing with activity.;Stress Weight Management/Obesity;Improve shortness of breath with ADL's;Develop more efficient breathing techniques such as purse lipped breathing and diaphragmatic breathing and practicing self-pacing with activity.;Stress      Review Monthly review of patient's Core Components/Risk Factors/Patient Goals are as follows:  Caraline has attended one session so far. Goal progressing for losing weight. Goal progressing for improving shortness of breath with ADL's. Zyria is currently exercising on RA to maintain sats >88%. Goal progressing for developing more efficient breathing techniques such as purse lipped breathing and diaphragmatic breathing; and practicing self-pacing with activity. Goal progressing to reduce stress. We will continue to monitor Natosha's progress throughout the program. Monthly review of patient's Core Components/Risk Factors/Patient Goals are as follows: Goal progressing for losing weight. Bunnie has met with the staff dietitian on ways to achieve her weight loss goals. Goal progressing for improving shortness of breath with ADL's. Amanii is currently exercising on RA to maintain sats >88%. Goal progressing for developing more efficient breathing techniques such as purse lipped breathing and diaphragmatic breathing; and practicing self-pacing with  activity. Goal progressing to reduce stress. We will continue to monitor Harlem's progress throughout the program.      Expected Outcomes To improve shortness of breath with ADL's, develop more efficient breathing techniques such as purse lipped breathing and diaphragmatic breathing; and practicing self-pacing with activity, lose weight and reduce stress To improve shortness of breath with ADL's, develop more efficient breathing techniques such as purse lipped breathing and diaphragmatic breathing; and practicing self-pacing with activity, lose weight and reduce stress         Core Components/Risk Factors/Patient Goals at Discharge (Final Review):   Goals and Risk Factor Review - 08/12/24 0952       Core Components/Risk Factors/Patient Goals Review   Personal Goals Review Weight Management/Obesity;Improve shortness of breath with ADL's;Develop more efficient breathing techniques such as purse lipped breathing and diaphragmatic breathing and practicing  self-pacing with activity.;Stress    Review Monthly review of patient's Core Components/Risk Factors/Patient Goals are as follows: Goal progressing for losing weight. Valleri has met with the staff dietitian on ways to achieve her weight loss goals. Goal progressing for improving shortness of breath with ADL's. Charna is currently exercising on RA to maintain sats >88%. Goal progressing for developing more efficient breathing techniques such as purse lipped breathing and diaphragmatic breathing; and practicing self-pacing with activity. Goal progressing to reduce stress. We will continue to monitor Kaneesha's progress throughout the program.    Expected Outcomes To improve shortness of breath with ADL's, develop more efficient breathing techniques such as purse lipped breathing and diaphragmatic breathing; and practicing self-pacing with activity, lose weight and reduce stress          ITP Comments: Pt is making expected progress toward Pulmonary Rehab goals after completing 7 session(s). Recommend continued exercise, life style modification, education, and utilization of breathing techniques to increase stamina and strength, while also decreasing shortness of breath with exertion.  Dr. Slater Staff is Medical Director for Pulmonary Rehab at [facility].

## 2024-08-20 ENCOUNTER — Encounter (HOSPITAL_COMMUNITY)
Admission: RE | Admit: 2024-08-20 | Discharge: 2024-08-20 | Disposition: A | Discharge status: Home / Self Care | Source: Ambulatory Visit | Attending: Pulmonary Disease

## 2024-08-20 DIAGNOSIS — J449 Chronic obstructive pulmonary disease, unspecified: Secondary | ICD-10-CM

## 2024-08-20 LAB — NM PET CT CARDIAC PERFUSION MULTI W/ABSOLUTE BLOODFLOW
LV dias vol: 105 mL (ref 46–106)
LV sys vol: 50 mL (ref 3.8–5.2)
MBFR: 1.41
Nuc Rest EF: 52 %
Nuc Stress EF: 59 %
Peak HR: 81 {beats}/min
Rest HR: 69 {beats}/min
Rest MBF: 1.2 ml/g/min
Rest Nuclear Isotope Dose: 22.4 mCi
ST Depression (mm): 0 mm
Stress MBF: 1.69 ml/g/min
Stress Nuclear Isotope Dose: 22 mCi

## 2024-08-20 NOTE — Progress Notes (Signed)
 I saw two somewhat contradictory statements.  LV perfusion is normal. There is no evidence of ischemia. Reduced myocardial blood flow reserve may be due to microvascular dysfunction, but cannot entirely exclude balanced ischemia.   Reversible ischemic defect in the LAD artery territory.   Is there ischemia?  Thank you Lara Palinkas

## 2024-08-20 NOTE — Progress Notes (Signed)
 There is no ischemia in the LAD territory. I think I accidentally clicked a box. I will correct it. Sorry!

## 2024-08-20 NOTE — Telephone Encounter (Signed)
 I agree - don't believe we will be able to answer this question without seeing her to discuss. Thanks.

## 2024-08-20 NOTE — Progress Notes (Signed)
 Daily Session Note  Patient Details  Name: [name] MRN: [id number] Date of Birth: [birth year] Referring Provider:   [facility] Walk Test from 07/10/2024 in [facility]  Referring Provider Dr. Fairy Danker ([facility])    Encounter Date: 08/20/2024  Check In:  Session Check In - 08/20/24 1153       Check-In   Supervising physician immediately available to respond to emergencies [facility] MD immediately available    Physician(s) Barnie Press, NP    Location [facility]    Staff Present [name], MS, ACSM-CEP, Exercise Physiologist;Daniela Hernan Claudene Candia Levin, RN, BSN;Randi Reeve BS, ACSM-CEP, Exercise Physiologist    Virtual Visit No    Medication changes reported     No    Fall or balance concerns reported    No    Tobacco Cessation No Change    Warm-up and Cool-down Performed as group-led instruction    Resistance Training Performed Yes    VAD Patient? No    PAD/SET Patient? No      Pain Assessment   Currently in Pain? No/denies    Multiple Pain Sites No          Capillary Blood Glucose: No results found. However, due to the size of the patient record, not all encounters were searched. Please check Results Review for a complete set of results.    Social History   Tobacco Use  Smoking Status Former   Current packs/day: 0.00   Average packs/day: 0.5 packs/day for 40.0 years (20.0 ttl pk-yrs)   Types: Cigarettes   Start date: 69   Quit date: 04/2018   Years since quitting: 6.3   Passive exposure: Never  Smokeless Tobacco Never  Tobacco Comments   Patient states her quit date was 05/09/2018.  Patient educated with resources at today's appointment to continue to support her to stop smoking.    Goals Met:  Proper associated with RPD/PD & O2 Sat Independence with exercise equipment Exercise tolerated well No report of concerns or symptoms  today Strength training completed today  Goals Unmet:  Not Applicable  Comments: Service time is from 1005 to 1142.    Dr. Slater Staff is Medical Director for Pulmonary Rehab at [facility].

## 2024-08-24 ENCOUNTER — Telehealth: Payer: Self-pay

## 2024-08-24 NOTE — Telephone Encounter (Signed)
 I was unable to find pt in [facility]. I called and spoke to pt. Pt states she was told she needed an appt before she could have the CPAP machine. I informed pt to just make sure she keeps her appt tomorrow and we could take care of this. Pt verbalized understanding. NFN

## 2024-08-25 ENCOUNTER — Encounter: Payer: Self-pay | Admitting: Primary Care

## 2024-08-25 ENCOUNTER — Encounter (HOSPITAL_COMMUNITY)
Admission: RE | Admit: 2024-08-25 | Discharge: 2024-08-25 | Disposition: A | Discharge status: Home / Self Care | Source: Ambulatory Visit | Attending: Pulmonary Disease | Admitting: Pulmonary Disease

## 2024-08-25 ENCOUNTER — Ambulatory Visit (INDEPENDENT_AMBULATORY_CARE_PROVIDER_SITE_OTHER): Admitting: Primary Care

## 2024-08-25 VITALS — BP 128/72 | HR 84 | Temp 97.3°F | Ht 63.0 in | Wt 189.8 lb

## 2024-08-25 DIAGNOSIS — G4730 Sleep apnea, unspecified: Secondary | ICD-10-CM

## 2024-08-25 DIAGNOSIS — G4733 Obstructive sleep apnea (adult) (pediatric): Secondary | ICD-10-CM | POA: Diagnosis not present

## 2024-08-25 DIAGNOSIS — J449 Chronic obstructive pulmonary disease, unspecified: Secondary | ICD-10-CM

## 2024-08-25 DIAGNOSIS — J4489 Other specified chronic obstructive pulmonary disease: Secondary | ICD-10-CM | POA: Diagnosis not present

## 2024-08-25 DIAGNOSIS — Z87891 Personal history of nicotine dependence: Secondary | ICD-10-CM | POA: Diagnosis not present

## 2024-08-25 DIAGNOSIS — Z23 Encounter for immunization: Secondary | ICD-10-CM

## 2024-08-25 DIAGNOSIS — J9611 Chronic respiratory failure with hypoxia: Secondary | ICD-10-CM

## 2024-08-25 DIAGNOSIS — J4540 Moderate persistent asthma, uncomplicated: Secondary | ICD-10-CM

## 2024-08-25 DIAGNOSIS — J441 Chronic obstructive pulmonary disease with (acute) exacerbation: Secondary | ICD-10-CM

## 2024-08-25 DIAGNOSIS — K219 Gastro-esophageal reflux disease without esophagitis: Secondary | ICD-10-CM

## 2024-08-25 NOTE — Patient Instructions (Addendum)
 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 Your sleep study showed mild sleep apnea, and we have set up a plan to start CPAP therapy. We also reviewed your current medications and strategies to manage your COPD and asthma, especially in relation to smoke exposure.  YOUR PLAN: -OBSTRUCTIVE SLEEP APNEA: Obstructive sleep apnea is a condition where your breathing stops and starts repeatedly during sleep due to blocked airways. Your sleep study showed mild sleep apnea with 11.2 events per hour. We will start you on CPAP therapy with a pressure setting of 12 to keep your airways open and improve your oxygen  levels. It's important to use the CPAP machine every night for at least 4 hours to ensure effective treatment and maintain insurance coverage. You should also use it during naps if needed. We discussed different CPAP mask options and maintenance, including changing the cushion monthly, tubing every 3 months, headgear and water chamber every 6 months, and the filter monthly. Use distilled water in the CPAP machine.  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 Your symptoms vary, with some days being better than others, and are often triggered by smoke exposure. Continue taking Fasenra  every 28 days, Breztri  twice daily, and Ohtuvayre  twice daily. Use your albuterol  inhaler as needed for quick relief. If your symptoms do not improve with albuterol , consider taking prednisone . Keep using air purifiers at home to reduce smoke exposure. We also discussed the benefits of the RSV vaccine to prevent respiratory infections, and you should get it if available.  Follow-up: 6-8 weeks for CPAP compliance    CPAP and BIPAP Information CPAP and BIPAP use air pressure to keep your airways  open and help you breathe well. CPAP and BIPAP use different amounts of pressure. Your health care provider will tell you whether CPAP or BIPAP would be best for you. CPAP stands for continuous positive airway pressure. With CPAP, the amount of pressure stays the same while you breathe in and out. BIPAP stands for bi-level positive airway pressure. With BIPAP, the amount of pressure will be higher when you breathe in and lower when you breathe out. This allows you to take bigger breaths. CPAP or BIPAP may be used in the hospital or at home. You may need to have a sleep study before your provider can order a device for you to use at home. What are the advantages? CPAP and BIPAP are most often used for obstructive sleep apnea to keep the airways from collapsing when the muscles relax during sleep. CPAP or BIPAP can be used if you have: Chronic obstructive pulmonary disease. Heart failure. Medical conditions that cause muscle weakness. Other problems that cause breathing to be shallow, weak, or difficult. What are the risks? Your provider will talk with you about risks. These may include: Sores on your nose or face caused from the mask, prongs, or nasal pillows. Dry or stuffy nose or nosebleeds. Feeling gassy or bloated. Sinus or lung infection if the equipment is not cleaned well. When should CPAP or BIPAP be used? In most cases, CPAP or BIPAP is used during sleep at night or whenever the main sleep time happens. It's also used during naps. People with some medical conditions may need to wear the mask when they're awake. Follow instructions from your provider about when  to use your CPAP or BIPAP. What happens during CPAP or BIPAP?  Both CPAP and BIPAP use a small machine that uses electricity to create air pressure. A long tube connects the device to a plastic mask. Air is blown through the mask into your nose or mouth. The amount of pressure that's used to blow the air can be adjusted. Your  provider will set the pressure setting and help you find the best mask for you. Tips for using the mask There are different types and sizes of masks. If your mask does not fit well, talk with your provider about getting a different one. Some common types of masks include: Full face masks, which fit over the mouth and nose. Nasal masks, which fit over the nose. Nasal pillow or prong masks, which fit into the nostrils. The mask needs to be snug to your face, so some people feel trapped or closed in at first. If you feel this way, you may need to get used to the mask. Hold the mask loosely over your nose or mouth and then gradually put the the mask on more snugly. Slowly increase the amount of time you use the mask. If you have trouble with your mask not fitting well or leaking, talk with your provider. Do not stop using the mask. Tips for using the device Follow instructions from your provider about how to and how often to use the device. For home use, CPAP and BIPAP devices come from home health care companies. There are many different brands. Your health insurance company will help to decide which device you get. Keep the CPAP or BIPAP device and attachments clean. Ask your home health care company or check the instruction book for cleaning instructions. Make sure the humidifier is filled with germ-free (sterile) water and is working correctly. This will help prevent a dry or stuffy nose or nosebleeds. A nasal saline mist or spray may keep your nose from getting dry and sore. Do not eat or drink while the CPAP or BIPAP device is on. Food or drinks could get pushed into your lungs by the pressure of the CPAP or BIPAP. Follow these instructions at home: Take over-the-counter and prescription medicines only as told by your provider. Do not smoke, vape, or use nicotine  or tobacco. Contact a health care provider if: You have redness or pressure sores on your head, face, mouth, or nose from the mask  or headgear. You have trouble using the CPAP or BIPAP device. You have trouble going to sleep or staying asleep. Someone tells you that you snore even when wearing your CPAP or BIPAP device. Get help right away if: You have trouble breathing. You feel confused. These symptoms may be an emergency. Get help right away. Call 911. Do not wait to see if the symptoms will go away. Do not drive yourself to the hospital. This information is not intended to replace advice given to you by your health care provider. Make sure you discuss any questions you have with your health care provider. Document Revised: 03/27/2023 Document Reviewed: 03/27/2023 Elsevier Patient Education  2024 [employer].

## 2024-08-25 NOTE — Progress Notes (Signed)
 @Patient  ID: [name], female    DOB: [birth year], 63 y.o.   MRN: [id number]  Chief Complaint  Patient presents with   Medical Management of Chronic Issues    Discuss getting a CPAP    Referring provider: Norleen Lynwood ORN, MD  HPI: 63 year old female, former smoker followed for COPD asthma overlap.  She is a patient of Dr. Shelah.  Past medical history significant for allergic rhinitis, anxiety, depression, GERD, hypertension, PVD, prediabetes, seizure, history of stroke.   TEST/EVENTS:  06/10/2020 echocardiogram: EF 60-65%; diastolic parameters normal. RV size and function nl. Unable to measure PASP. Trivial MR.  03/08/2022 CT chest wo contrast: atherosclerosis. No LAD. Emphysematous disease is noted b/l. No suspicious nodules mentioned.  05/28/2022 CXR 1 view: lungs are mildly hyperinflated, progressive since previous. Lungs are clear with no acute process.  12/05/2022 PFT: FVC 45, FEV1 28, ratio 52, TLC 107, DLCOcor 49. Positive BD (27%) 02/22/2023 CT chest wo con: atherosclerosis. No LAD. No acute process. Diffuse bronchial wall thickening with mild to moderate emphysema. Underlying cirrhosis.  02/24/2023 echo: EF 60-65%. Normal diastolic parameters. RV size and function nl. Nl PASP.  01/27/2024 HST: AHI 11.2, SpO2 low 74%   03/05/2023: OV with Dr. Shelah. Asthmatic/COPD and frequent exacerbations. Upper airway instability and obesity, GERD, chronic rhinitis.  She also has chronic hypercapnic respiratory failure which she is treated with BiPAP nightly.  She is in the hospital for acute exacerbation, discharged on 02/27/2023.  She was treated with Yupelri , budesonide  and Brovana  while in the hospital.  Also on daily pantoprazole .  Remains on Dupixent  every 14 days as well as Xyzal , Flonase , Singulair , Pepcid .  Maintenance regimen includes Breo history.  Using DuoNeb approximately every other day.  Completed prednisone  taper.  SLP evaluation today> laryngeal spasm on their eval.  They  did work on some relaxation techniques.  Unclear how much recent hospitalization exacerbation was related to upper airway instability versus her obstructive lung disease.  Did require BiPAP.  Improved with standard therapy including steroids.  Encouraged to continue follow-up with SLP.   05/06/2023: Ov with Cobb NP for follow-up. She has been feeling about the same since she was here last. No hospitalizations or exacerbations. She continues to struggle with her breathing with most activities. She has been a little more breathless in the morning over the past few months. She is okay at rest. She is working on breathing techniques that SLP reviewed with her. These do help her a lot when she starts to feel short winded. She thinks it also calms her anxiety. She wants to know if there's any specific exercises she can do for her lungs. She does notice some occasional wheeze, usually only when she's really exerting herself. She is still having voice hoarseness, which tends to worsen with more coughing. ENT told her she had some inflammation and encouraged she continue reflux precautions. She doesn't feel like she's noticed a huge change. She denies any cough, fevers, chills, hemoptysis, leg swelling, orthopnea, PND, palpitations. Sinus symptoms are stable. She is using her nebs 1-2 times a day. She stopped using NIV because she felt like she couldn't breathe on it. Felt like it was pushing too much air. She had called [employer] who said someone would come out to help troubleshoot but no one ever did.    10/09/2023: OV with Dr. Shelah.  Upper airway resistance syndrome, vocal cord dysfunction.  Main contributor to recurrent flares.  Also his underlying asthma which compounds the issue.  Working  hard to decrease component of GERD and rhinitis.  Has seen ENT.  Has tried on exam, all upper airway obstruction without any clear evidence to support an asthma flare.  Continue same bronchodilator regimen.  Encouraged her to try to  get on BiPAP for chronic hypercarbia.  She has been unable to tolerate.   10/09/2023: OV with Cobb NP for follow-up.  Since her last visit, she saw Dr. Iva with allergy  who started her on Ohtuvayre .  She started this yesterday.  She actually does tell me that she thinks that it is helping.  She feels like her breathing is a little bit better with it.  She does still have daily symptoms of dyspnea on relatively high symptom burden.  Does have some wheezing.  No significant cough.  Denies any fevers, chills, hemoptysis.  No lower extremity swelling or orthopnea. She does struggle with daily symptoms of fatigue.  Has had difficulties tolerating BiPAP.  Has never had a formal sleep study before.  Does have loud snoring at night.  Has been told that she stops breathing when she sleeps at night.  No issues with drowsy driving or morning headaches.  No sleep parasomnia/paralysis.   02/21/2024: Today - follow up Discussed the use of AI scribe software for clinical note transcription with the patient, who gave verbal consent to proceed.   History of Present Illness   The patient presents with sleep apnea and daytime sleepiness. She is accompanied by [name], a young family member.   She experiences symptoms of sleep apnea, including daytime sleepiness, loud snoring, and episodes of apnea during sleep. She has been using a BiPAP machine at home but has had difficulty tolerating it. A recent home sleep study off CPAP confirmed mild sleep apnea with oxygen  levels dropping to 74%.   She reports improvement in her breathing and ability to perform activities such as walking through [place], which she had not done in about a year. Previously, she would become severely out of breath, to the point of needing to sit and sometimes urinate in her clothes. She attributes some of this improvement to better control of her reflux.   We had added on famotidine  at her last visit to her protonix . She also saw GI but isn't sure  if they made any changes or if so, what it was. She's just on the famotidine  and pantoprazole  right now. Her cough has also improved, which she associates with better reflux control.    She did also start Ohtuvayre  late January.    08/25/2024- Interim hx  Discussed the use of AI scribe software for clinical note transcription with the patient, who gave verbal consent to proceed.  History of Present Illness Eulalia Ellerman is a 63 year old female with sleep apnea and COPD with asthma who presents for review of her recent sleep study results.  She underwent a sleep study on Apr 24, 2024, which revealed mild sleep apnea with an average of 11.2 apneic events per hour. The lowest oxygen  saturation recorded during the home sleep study was 74%. An in-lab titration study found that a CPAP with a pressure of 12 was used. During this study, her oxygen  level was 93% with the lowest being 83%, and she spent 1.2 minutes with low oxygen  levels.  She experiences variability in her COPD and asthma symptoms, describing 'good days and bad days'. Her current medications include Fasenra  every 28 days, Breztri  twice a day, and Ohtuvayre  twice a day. She receives Fasenra  at the  doctor's office. Exposure to marijuana smoke in her living environment triggers her asthma symptoms. She uses an albuterol  rescue inhaler, which she used this morning after exposure to smoke, providing some relief. She has two air purifiers, one in the kitchen and another in her sleeping area.    Allergies  Allergen Reactions   Sulfa Antibiotics Nausea And Vomiting and Other (See Comments)   Venofer  [Iron  Sucrose] Hives    Immunization History  Administered Date(s) Administered   Influenza Split 10/23/2011   Influenza Whole 09/16/2012   Influenza, Seasonal, Injecte, Preservative Fre 08/30/2023   Influenza,inj,Quad PF,6+ Mos 10/18/2014, 08/31/2015, 09/25/2016, 10/09/2017, 09/23/2018, 08/12/2019, 10/06/2020, 11/24/2021,  08/10/2022   PFIZER(Purple Top)SARS-COV-2 Vaccination 02/24/2020, 03/16/2020, 10/29/2020   PNEUMOCOCCAL CONJUGATE-20 06/15/2021   Pneumococcal Polysaccharide-23 12/02/2015   Td 04/22/2014   Zoster Recombinant(Shingrix) 11/26/2022, 02/18/2023   Zoster, Unspecified 11/19/2022    Past Medical History:  Diagnosis Date   Anemia, iron  deficiency 12/22/2014   pt. 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      Bipolar disorder (HCC)    with anxiety, depression   COPD (chronic obstructive pulmonary disease) (HCC)    Eczema 05/18/2022   Fibromyalgia 05/14/2014   GERD (gastroesophageal reflux disease)    History of kidney stones    Hyperlipidemia 04/20/2017   HYPERTENSION 05/12/2009   Qualifier: Diagnosis of  By: Primus LATHER LEODIS), [name]     Peripheral vascular disease (HCC)    Pneumonia    Prediabetes 02/23/2014   pt. denies   Seizure (HCC)    Stroke (HCC) 11/2020   Urticaria    Vitamin D  deficiency     Tobacco History: Social History   Tobacco Use  Smoking Status Former   Current packs/day: 0.00   Average packs/day: 0.5 packs/day for 40.0 years (20.0 ttl pk-yrs)   Types: Cigarettes   Start date: 42   Quit date: 04/2018   Years since quitting: 6.3   Passive exposure: Never  Smokeless Tobacco Never  Tobacco Comments   Patient states her quit date was 05/09/2018.  Patient educated with resources at today's appointment to continue to support her to stop smoking.   Counseling given: Not Answered Tobacco comments: Patient states her quit date was 05/09/2018.  Patient educated with resources at today's appointment to continue to support her to stop smoking.   Outpatient Medications Prior to Visit  Medication Sig Dispense Refill   AIRSUPRA  90-80 MCG/ACT AERO SMARTSIG:2 Puff(s) Via Inhaler  4 Times Daily PRN     albuterol  (PROVENTIL ) (2.5 MG/3ML) 0.083% nebulizer solution USE 1 VIAL VIA NEBULIZER EVERY 4 HOURS AS NEEDED FOR WHEEZING OR SHORTNESS OF BREATH 75 mL 1   albuterol  (VENTOLIN  HFA) 108 (90 Base) MCG/ACT inhaler INHALE 2 PUFFS INTO THE LUNGS EVERY 6 HOURS AS NEEDED FOR WHEEZING OR SHORTNESS OF BREATH 18 g 1   amLODipine  (NORVASC ) 5 MG tablet Take 1 tablet (5 mg total) by mouth daily. 90 tablet 3   ARIPiprazole  (ABILIFY ) 10 MG tablet Take 1 tablet (10 mg total) by mouth daily. Take Abilify  20 mg po daily for 2 weeks till 9/17 , then continue taking 10 mg po daily 30 tablet 1   Aspirin -Salicylamide-Caffeine  (BC HEADACHE POWDER PO) Take 1 packet by mouth daily.     atorvastatin  (LIPITOR) 80 MG tablet Take 1 tablet (80 mg total)  by mouth daily. 90 tablet 1   azelastine  (OPTIVAR ) 0.05 % ophthalmic solution Place 1 drop into both eyes 2 (two) times daily. 6 mL 12   benralizumab  (FASENRA  PEN) 30 MG/ML prefilled autoinjector Inject 1 mL (30 mg total) into the skin every 28 (twenty-eight) days. For 3 doses then every 8 weeks 1 mL 9   BREZTRI  AEROSPHERE 160-9-4.8 MCG/ACT AERO inhaler INHALE 2 PUFFS INTO THE LUNGS IN THE MORNING AND AT BEDTIME 10.7 g 11   budesonide  (PULMICORT ) 0.5 MG/2ML nebulizer solution Take 2 mLs (0.5 mg total) by nebulization in the morning and at bedtime. 120 mL 2   carbamazepine  (TEGRETOL ) 200 MG tablet Take 2-3 tablets (400-600 mg total) by mouth See admin instructions. Take 400mg  in the morning and 600mg  at bedtime. 150 tablet 2   cetirizine  (ZYRTEC ) 10 MG tablet Can take one tablet by mouth once daily if needed for runny nose. 30 tablet 5   cromolyn  (OPTICROM ) 4 % ophthalmic solution Place 1 drop into both eyes 4 (four) times daily. 10 mL 12   doxepin  (SINEQUAN ) 25 MG capsule TAKE 1 CAPSULE(25 MG) BY MOUTH AT BEDTIME AS NEEDED 30 capsule 5   Ensifentrine  (OHTUVAYRE ) 3 MG/2.5ML SUSP Inhale 3 mg into the lungs in the morning and at bedtime. 150 mL 11   estradiol   (ESTRACE ) 0.1 MG/GM vaginal cream Place 1g nightly 2-3 times a week 30 g 3   famotidine  (PEPCID ) 40 MG tablet Take 1 tablet (40 mg total) by mouth 2 (two) times daily. 60 tablet 5   ferrous sulfate  325 (65 FE) MG EC tablet TAKE 1 TABLET(325 MG) BY MOUTH DAILY WITH BREAKFAST 90 tablet 1   fluticasone  (FLONASE ) 50 MCG/ACT nasal spray SHAKE LIQUID AND USE 1 SPRAY IN EACH NOSTRIL TWICE DAILY AS NEEDED FOR ALLERGIES OR RHINITIS 16 g 5   formoterol  (PERFOROMIST ) 20 MCG/2ML nebulizer solution Take 20 mcg by nebulization 2 (two) times daily.     furosemide  (LASIX ) 20 MG tablet TAKE 1 TABLET BY MOUTH DAILY AS NEEDED 30 tablet 5   hydrOXYzine  (VISTARIL ) 25 MG capsule Take one tablet every 6-8 hours as needed for itching. 30 capsule 1   ipratropium (ATROVENT  HFA) 17 MCG/ACT inhaler Inhale 2 puffs into the lungs every 4 (four) hours as needed for wheezing. 1 each 5   irbesartan  (AVAPRO ) 150 MG tablet Take 1 tablet (150 mg total) by mouth daily. 90 tablet 3   levocetirizine (XYZAL ) 5 MG tablet Take 1 tablet (5 mg total) by mouth in the morning and at bedtime. 60 tablet 5   montelukast  (SINGULAIR ) 10 MG tablet TAKE 1 TABLET(10 MG) BY MOUTH AT BEDTIME 90 tablet 1   Nebulizers (PARI PRONEB MAX LC SPRINT) MISC      omeprazole  (PRILOSEC) 40 MG capsule Take 1 capsule (40 mg total) by mouth daily. 90 capsule 3   ondansetron  (ZOFRAN -ODT) 4 MG disintegrating tablet DISSOLVE 1 TABLET(4 MG) ON THE TONGUE EVERY 8 HOURS AS NEEDED FOR NAUSEA OR VOMITING 30 tablet 1   Skin Protectants, Misc. (EUCERIN) cream Apply 1 Application topically daily as needed for dry skin.     tacrolimus  (PROTOPIC ) 0.03 % ointment Apply 1 Application topically 2 (two) times daily as needed (rash). (Patient taking differently: Apply 1 Application topically 2 (two) times daily as needed (recurrent rash).) 60 g 5   triamcinolone  ointment (KENALOG ) 0.1 % Apply 1 Application topically at bedtime. 30 g 5   Vibegron  (GEMTESA ) 75 MG TABS Take 1 tablet (75 mg  total)  by mouth daily. 30 tablet 11   Facility-Administered Medications Prior to Visit  Medication Dose Route Frequency Provider Last Rate Last Admin   benralizumab  (FASENRA ) prefilled syringe 30 mg  30 mg Subcutaneous Q8 Weeks Gallagher, Joel Louis, MD   30 mg at 07/23/24 1337      Review of Systems  Review of Systems  Constitutional: Negative.   Respiratory: Negative.       Physical Exam  BP 128/72   Pulse 84   Temp (!) 97.3 F (36.3 C)   Ht 5' 3 (1.6 m)   Wt 189 lb 12.8 oz (86.1 kg)   SpO2 94% Comment: ra  BMI 33.62 kg/m  Physical Exam Constitutional:      Appearance: Normal appearance.  HENT:     Head: Normocephalic and atraumatic.  Cardiovascular:     Rate and Rhythm: Normal rate and regular rhythm.  Pulmonary:     Effort: Pulmonary effort is normal. No respiratory distress.     Breath sounds: Normal breath sounds. No stridor. No wheezing or rhonchi.     Comments: Upper airway wheeze, lung fields clear  Musculoskeletal:        General: Normal range of motion.  Skin:    General: Skin is warm and dry.  Neurological:     General: No focal deficit present.     Mental Status: She is alert and oriented to person, place, and time. Mental status is at baseline.  Psychiatric:        Mood and Affect: Mood normal.        Behavior: Behavior normal.        Thought Content: Thought content normal.        Judgment: Judgment normal.      Lab Results:  CBC    Component Value Date/Time   WBC 10.1 05/25/2024 1326   WBC 8.8 12/25/2023 1504   RBC 3.70 (L) 05/25/2024 1326   RBC 3.76 (L) 05/25/2024 1326   HGB 10.0 (L) 05/25/2024 1326   HGB 10.8 (L) 04/09/2024 1157   HCT 32.1 (L) 05/25/2024 1326   HCT 35.0 04/09/2024 1157   PLT 386 05/25/2024 1326   PLT 451 (H) 04/09/2024 1157   MCV 85.4 05/25/2024 1326   MCV 86 04/09/2024 1157   MCH 26.6 05/25/2024 1326   MCHC 31.2 05/25/2024 1326   RDW 15.8 (H) 05/25/2024 1326   RDW 16.1 (H) 04/09/2024 1157   LYMPHSABS 2.0  05/25/2024 1326   LYMPHSABS 1.7 04/09/2024 1157   MONOABS 0.7 05/25/2024 1326   EOSABS 0.0 05/25/2024 1326   EOSABS 0.0 04/09/2024 1157   BASOSABS 0.0 05/25/2024 1326   BASOSABS 0.0 04/09/2024 1157    BMET    Component Value Date/Time   NA 143 05/25/2024 1326   NA 144 07/31/2019 1151   K 4.0 05/25/2024 1326   CL 103 05/25/2024 1326   CO2 32 05/25/2024 1326   GLUCOSE 99 05/25/2024 1326   BUN 18 05/25/2024 1326   BUN 11 07/31/2019 1151   CREATININE 0.91 05/25/2024 1326   CREATININE 0.71 02/23/2014 1054   CALCIUM  9.3 05/25/2024 1326   GFRNONAA >60 05/25/2024 1326   GFRNONAA >89 02/23/2014 1054   GFRAA >60 07/19/2020 1113   GFRAA >89 02/23/2014 1054    BNP    Component Value Date/Time   BNP 26.8 08/05/2023 1542    ProBNP    Component Value Date/Time   PROBNP 127 04/21/2024 1157   PROBNP 15.0 04/19/2017 1636    Imaging: NM  PET CT CARDIAC PERFUSION MULTI W/ABSOLUTE BLOODFLOW Addendum Date: 08/20/2024   LV perfusion is normal. There is no evidence of ischemia. Reduced myocardial blood flow reserve may be due to microvascular dysfunction, but cannot entirely exclude balanced ischemia.   Rest left ventricular function is normal. Rest EF: 52%. Stress left ventricular function is normal. Stress EF: 59%. End diastolic cavity size is normal. End systolic cavity size is normal.   Myocardial blood flow was computed to be 1.32ml/g/min at rest and 1.53ml/g/min at stress. Global myocardial blood flow reserve was 1.41 and was abnormal.   Coronary calcium  was present on the attenuation correction CT images. Minimal coronary calcifications were present. Coronary calcifications were present in the left circumflex artery and right coronary artery distribution(s).   The study is normal. The study is low risk.   Elelctronically signed by Jerel Balding, MD CLINICAL DATA:  This over-read does not include interpretation of cardiac or coronary anatomy or pathology. The interpretation by the  cardiologist is attached. COMPARISON:  None Available. FINDINGS: Scout view is grossly unremarkable. Atherosclerotic calcification of the aorta. Liver margin is irregular. Vague small hypodense lesion in the left hepatic lobe. Centrilobular emphysema. IMPRESSION: 1. Cirrhosis. Vague small hypodense lesion in the left hepatic lobe, difficult to further characterize due to size, streak artifact and lack of postcontrast imaging. Consider MR abdomen without and with contrast in further evaluation, as clinically indicated. 2.  Aortic atherosclerosis (ICD10-I70.0). 3.  Emphysema (ICD10-J43.9). Electronically Signed   By: Newell Eke M.D.   On: 08/19/2024 11:41   Result Date: 08/20/2024   LV perfusion is normal. There is no evidence of ischemia. Reduced myocardial blood flow reserve may be due to microvascular dysfunction, but cannot entirely exclude balanced ischemia.   Rest left ventricular function is normal. Rest EF: 52%. Stress left ventricular function is normal. Stress EF: 59%. End diastolic cavity size is normal. End systolic cavity size is normal.   Myocardial blood flow was computed to be 1.96ml/g/min at rest and 1.78ml/g/min at stress. Global myocardial blood flow reserve was 1.41 and was abnormal.   Coronary calcium  was present on the attenuation correction CT images. Minimal coronary calcifications were present. Coronary calcifications were present in the left circumflex artery and right coronary artery distribution(s).   The study is normal. The study is low risk.   Reversible ischemic defect in the LAD artery territory. CLINICAL DATA:  This over-read does not include interpretation of cardiac or coronary anatomy or pathology. The interpretation by the cardiologist is attached. COMPARISON:  None Available. FINDINGS: Scout view is grossly unremarkable. Atherosclerotic calcification of the aorta. Liver margin is irregular. Vague small hypodense lesion in the left hepatic lobe. Centrilobular emphysema.  IMPRESSION: 1. Cirrhosis. Vague small hypodense lesion in the left hepatic lobe, difficult to further characterize due to size, streak artifact and lack of postcontrast imaging. Consider MR abdomen without and with contrast in further evaluation, as clinically indicated. 2.  Aortic atherosclerosis (ICD10-I70.0). 3.  Emphysema (ICD10-J43.9). Electronically Signed   By: Newell Eke M.D.   On: 08/19/2024 11:41    Assessment & Plan:   1. COPD/asthma (Primary)  2. Laryngopharyngeal reflux (LPR)  3. Moderate persistent asthma without complication  4. Chronic respiratory failure with hypoxia and hypercapnia (HCC)   Assessment and Plan Assessment & Plan Obstructive sleep apnea Mild obstructive sleep apnea with 11.2 apneic events per hour. Lowest oxygen  saturation during home sleep study was 74%. CPAP titration study indicated a pressure of 12 resolved apneic events, with oxygen  saturation  improving to 93%. No need for supplemental oxygen . CPAP therapy is expected to prevent apneic events by maintaining airway patency. Compliance with CPAP usage is crucial for insurance coverage and effective treatment. No indication for non-invasive ventilator (NIV) support at this time.  - Order CPAP with a pressure setting of 12cwp and EPR level 2 - Instruct to use CPAP nightly for a minimum of 4 hours to ensure compliance - Advise to use CPAP during naps if needed to meet compliance - Educate on CPAP mask options and encourage trying different masks if needed - Instruct on CPAP maintenance: change cushion monthly, tubing every 3 months, headgear and water chamber every 6 months, and filter monthly - Advise to use distilled water in CPAP  Chronic obstructive pulmonary disease (COPD) with asthma COPD with asthma, managed with Fasenra , Breztri , and Ohtuvayre . Reports good and bad days, with exacerbations triggered by exposure to smoke, particularly marijuana smoke from neighbors. Uses albuterol  as a rescue  inhaler. Recent exposure to smoke caused difficulty in breathing, but albuterol  provided relief. Weather changes may also contribute to symptoms. Air purifiers are used at home to mitigate smoke exposure. Discussed RSV vaccine as a preventive measure against potential complications from respiratory infections. - Continue Fasenra  every 28  - Continue Breztri  twice daily - Continue Ohtuvayre  nebulizer twice daily - Use albuterol  as needed for rescue - Consider prednisone  if symptoms do not improve with albuterol  - Encourage use of air purifiers to reduce smoke exposure - Administer RSV vaccine    Almarie LELON Ferrari, NP 08/25/2024

## 2024-08-25 NOTE — Progress Notes (Signed)
 Daily Session Note  Patient Details  Name: [name] MRN: [id number] Date of Birth: 01-14-1961 Referring Provider:   [facility]rad Ports Pulmonary Rehab Walk Test from 07/10/2024 in [facility]  Referring Provider Dr. Fairy Danker ([facility])    Encounter Date: 08/25/2024  Check In:  Session Check In - 08/25/24 1032       Check-In   Supervising physician immediately available to respond to emergencies [facility] MD immediately available    Physician(s) Josefa Beauvais, NP    Location [facility]    Staff Present [name], MS, ACSM-CEP, Exercise Physiologist;Casey Claudene Candia Levin, RN, BSN;Marily Konczal BS, ACSM-CEP, Exercise Physiologist    Virtual Visit No    Medication changes reported     No    Fall or balance concerns reported    No    Tobacco Cessation No Change    Warm-up and Cool-down Performed as group-led instruction    Resistance Training Performed Yes    VAD Patient? No    PAD/SET Patient? No      Pain Assessment   Currently in Pain? No/denies    Multiple Pain Sites No          Capillary Blood Glucose: No results found. However, due to the size of the patient record, not all encounters were searched. Please check Results Review for a complete set of results.    Social History   Tobacco Use  Smoking Status Former   Current packs/day: 0.00   Average packs/day: 0.5 packs/day for 40.0 years (20.0 ttl pk-yrs)   Types: Cigarettes   Start date: 43   Quit date: 04/2018   Years since quitting: 6.3   Passive exposure: Never  Smokeless Tobacco Never  Tobacco Comments   Patient states her quit date was 05/09/2018.  Patient educated with resources at today's appointment to continue to support her to stop smoking.    Goals Met:  Exercise tolerated well No report of concerns or symptoms today Strength training completed today  Goals Unmet:  Not Applicable  Comments: Service  time is from 1015 to 1137.    Dr. Slater Staff is Medical Director for Pulmonary Rehab at [facility].

## 2024-08-26 ENCOUNTER — Ambulatory Visit: Admitting: Internal Medicine

## 2024-08-26 ENCOUNTER — Encounter: Payer: Self-pay | Admitting: Internal Medicine

## 2024-08-26 VITALS — BP 112/80 | HR 85 | Temp 97.4°F | Ht 63.0 in | Wt 187.4 lb

## 2024-08-26 DIAGNOSIS — K7460 Unspecified cirrhosis of liver: Secondary | ICD-10-CM

## 2024-08-26 DIAGNOSIS — E559 Vitamin D deficiency, unspecified: Secondary | ICD-10-CM

## 2024-08-26 DIAGNOSIS — R1900 Intra-abdominal and pelvic swelling, mass and lump, unspecified site: Secondary | ICD-10-CM

## 2024-08-26 DIAGNOSIS — J4489 Other specified chronic obstructive pulmonary disease: Secondary | ICD-10-CM

## 2024-08-26 NOTE — Telephone Encounter (Signed)
 Left message to call back.

## 2024-08-26 NOTE — Assessment & Plan Note (Signed)
 No ascites, compensated,  to f/u any worsening symptoms or concerns

## 2024-08-26 NOTE — Assessment & Plan Note (Signed)
 Stable, cont inhaler, cont pulm rehab

## 2024-08-26 NOTE — Assessment & Plan Note (Signed)
 Last vitamin D Lab Results  Component Value Date   VD25OH 36.15 12/25/2023   Low, to start  oral replacement

## 2024-08-26 NOTE — Patient Instructions (Signed)
 Ok to restart the muscle relaxer as needed for the cramps  Please continue all other medications as before, and refills have been done if requested.  Please have the pharmacy call with any other refills you may need.  Please keep your appointments with your specialists as you may have planned - Rehab  You will be contacted regarding the referral for: MRI of the abdomen  Please be sure to call Dr Newman for the final result of the heart testing you recently had done  No lab work needed today  Please make an Appointment to return in 6 months, or sooner if needed

## 2024-08-26 NOTE — Assessment & Plan Note (Signed)
 Pt for MRI w wo CM abdomen, r/o liver mass

## 2024-08-26 NOTE — Progress Notes (Signed)
 Patient ID: [name], female   DOB: January 06, 1961, 63 y.o.   MRN: [id number]        Chief Complaint: follow up recent imaging abnormal  - ? left liver mass, ? Abnormal stress testing, copd, cirrhosis, low vit d       HPI:  [name] is a 63 y.o. female here with recent stress test but conflicting results it seems, pt plans to f/u with Dr Newman regarding final.  Did also have PET CT with ? Left liver mass.  Also noted was cirrhosis.  Pt denies chest pain, increased sob or doe, wheezing, orthopnea, PND, increased LE swelling, palpitations, dizziness or syncope.   Pt denies polydipsia, polyuria, or new focal neuro s/s.   Also has left arm cramping at times and has muscle relaxer at home that she has not tried.  Pt continuing with pulmonary rehab twice weekly and improving.         Wt Readings from Last 3 Encounters:  08/26/24 187 lb 6.4 oz (85 kg)  08/25/24 189 lb 12.8 oz (86.1 kg)  08/18/24 189 lb 13.1 oz (86.1 kg)   BP Readings from Last 3 Encounters:  08/26/24 112/80  08/25/24 128/72  08/19/24 137/67         Past Medical History:  Diagnosis Date   Allergy     Anemia, iron  deficiency 12/22/2014   pt. 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      Asthma    Bipolar disorder (HCC)    with anxiety, depression   COPD (chronic obstructive pulmonary disease) (HCC)    Depression    Eczema 05/18/2022   Emphysema of lung (HCC)    Fibromyalgia 05/14/2014   GERD (gastroesophageal reflux disease)    History of kidney stones    Hyperlipidemia 04/20/2017   HYPERTENSION 05/12/2009   Qualifier: Diagnosis of  By: Primus LATHER LEODIS), [name]     Osteoporosis    Peripheral vascular disease (HCC)    Pneumonia    Prediabetes 02/23/2014   pt. denies   Seizure (HCC)     Sleep apnea    Stroke (HCC) 11/2020   Urticaria    Vitamin D  deficiency    Past Surgical History:  Procedure Laterality Date   ABDOMINAL HYSTERECTOMY     ANTERIOR CERVICAL DECOMP/DISCECTOMY FUSION N/A 07/28/2020   Procedure: ANTERIOR CERVICAL DECOMPRESSION/DISCECTOMY FUSION. INTERBODY PROTHESIS, PLATE/SCREWS CERVICAL THREE- CERVICAL FOUR, CERVICAL FOUR- CERVICAL FIVE;  Surgeon: Mavis Purchase, MD;  Location: [facility] OR;  Service: Neurosurgery;  Laterality: N/A;   BACK SURGERY     COLONOSCOPY  12/20/2011   Procedure: COLONOSCOPY;  Surgeon: Elsie CHRISTELLA Cree, MD;  Location: [facility] ENDOSCOPY;  Service: Endoscopy;  Laterality: N/A;   COLONOSCOPY  03/05/2012   Procedure: COLONOSCOPY;  Surgeon: Elsie CHRISTELLA Cree, MD;  Location: [facility] ENDOSCOPY;  Service: Endoscopy;  Laterality: N/A;   DIAGNOSTIC LAPAROSCOPY     ESOPHAGOGASTRODUODENOSCOPY (EGD) WITH PROPOFOL  N/A 10/31/2023   Procedure: ESOPHAGOGASTRODUODENOSCOPY (EGD) WITH PROPOFOL ;  Surgeon: Legrand Victory LITTIE DOUGLAS, MD;  Location: [facility] ENDOSCOPY;  Service: Gastroenterology;  Laterality: N/A;   HEMORRHOID SURGERY     INCISE AND DRAIN ABCESS     KIDNEY STONE SURGERY     NECK SURGERY     x 2 Dr JINNY Mavis   SPINE SURGERY  2019   TOE SURGERY     TUBAL LIGATION      reports that she quit smoking about 6 years ago. Her smoking use included cigarettes. She started smoking about 46 years ago. She has a 20 pack-year smoking history. She has never been exposed to tobacco smoke. She has never used smokeless tobacco. She reports that she does not currently use alcohol . She reports that she does not currently use drugs after having used the following drugs: Marijuana. family history includes Alcohol  abuse in her brother and father; Anemia in her daughter, sister, and sister; Anesthesia problems in her father; Asthma in her brother, brother, mother, and sister; Brain cancer in her sister; Breast cancer (age of onset: 35) in her sister; COPD in her brother, mother, and sister;  Colon cancer (age of onset: 2) in her father; Depression in her brother; Diabetes in her brother, father, mother, sister, sister, sister, and sister; Heart disease in her brother, brother, mother, and sister; Hypertension in her brother, brother, daughter, sister, sister, sister, sister, and sister; Kidney disease in her father; Sleep apnea in her brother. Allergies  Allergen Reactions   Sulfa Antibiotics Nausea And Vomiting and Other (See Comments)   Venofer  [Iron  Sucrose] Hives   Current Outpatient Medications on File Prior to Visit  Medication Sig Dispense Refill   AIRSUPRA  90-80 MCG/ACT AERO SMARTSIG:2 Puff(s) Via Inhaler 4 Times Daily PRN     albuterol  (PROVENTIL ) (2.5 MG/3ML) 0.083% nebulizer solution USE 1 VIAL VIA NEBULIZER EVERY 4 HOURS AS NEEDED FOR WHEEZING OR SHORTNESS OF BREATH 75 mL 1   albuterol  (VENTOLIN  HFA) 108 (90 Base) MCG/ACT inhaler INHALE 2 PUFFS INTO THE LUNGS EVERY 6 HOURS AS NEEDED FOR WHEEZING OR SHORTNESS OF BREATH 18 g 1   amLODipine  (NORVASC ) 5 MG tablet Take 1 tablet (5 mg total) by mouth daily. 90 tablet 3   ARIPiprazole  (ABILIFY ) 10 MG tablet Take 1 tablet (10 mg total) by mouth daily. Take Abilify  20 mg po daily for 2 weeks till 9/17 , then continue taking 10 mg po daily 30 tablet 1   Aspirin -Salicylamide-Caffeine  (BC HEADACHE POWDER PO) Take 1 packet by mouth daily.     atorvastatin  (LIPITOR) 80 MG tablet Take 1 tablet (80 mg total) by mouth daily. 90 tablet 1   azelastine  (OPTIVAR ) 0.05 % ophthalmic solution Place 1 drop into both eyes 2 (two) times daily. 6 mL 12   benralizumab  (FASENRA  PEN) 30 MG/ML prefilled autoinjector Inject 1 mL (30 mg total) into the skin every 28 (twenty-eight) days. For 3 doses then every 8 weeks 1 mL 9   BREZTRI  AEROSPHERE 160-9-4.8 MCG/ACT AERO inhaler INHALE 2 PUFFS INTO THE LUNGS IN THE MORNING AND AT BEDTIME 10.7 g 11   budesonide  (PULMICORT ) 0.5 MG/2ML nebulizer solution Take 2 mLs (0.5 mg total) by nebulization in the morning  and at bedtime. 120 mL 2   carbamazepine  (TEGRETOL ) 200 MG tablet Take 2-3 tablets (400-600 mg total) by mouth See admin instructions. Take 400mg  in the morning and 600mg  at bedtime. 150 tablet 2   cetirizine  (ZYRTEC ) 10 MG tablet Can take one tablet by mouth once daily if needed for runny nose. 30 tablet 5   cromolyn  (OPTICROM ) 4 % ophthalmic solution Place 1 drop into both eyes 4 (four) times daily. 10 mL 12   doxepin  (SINEQUAN ) 25 MG capsule TAKE 1 CAPSULE(25 MG) BY MOUTH AT BEDTIME AS NEEDED 30 capsule 5   Ensifentrine  (OHTUVAYRE ) 3 MG/2.5ML SUSP Inhale 3 mg into the lungs  in the morning and at bedtime. 150 mL 11   estradiol  (ESTRACE ) 0.1 MG/GM vaginal cream Place 1g nightly 2-3 times a week 30 g 3   famotidine  (PEPCID ) 40 MG tablet Take 1 tablet (40 mg total) by mouth 2 (two) times daily. 60 tablet 5   ferrous sulfate  325 (65 FE) MG EC tablet TAKE 1 TABLET(325 MG) BY MOUTH DAILY WITH BREAKFAST 90 tablet 1   fluticasone  (FLONASE ) 50 MCG/ACT nasal spray SHAKE LIQUID AND USE 1 SPRAY IN EACH NOSTRIL TWICE DAILY AS NEEDED FOR ALLERGIES OR RHINITIS 16 g 5   formoterol  (PERFOROMIST ) 20 MCG/2ML nebulizer solution Take 20 mcg by nebulization 2 (two) times daily.     furosemide  (LASIX ) 20 MG tablet TAKE 1 TABLET BY MOUTH DAILY AS NEEDED 30 tablet 5   hydrOXYzine  (VISTARIL ) 25 MG capsule Take one tablet every 6-8 hours as needed for itching. 30 capsule 1   ipratropium (ATROVENT  HFA) 17 MCG/ACT inhaler Inhale 2 puffs into the lungs every 4 (four) hours as needed for wheezing. 1 each 5   irbesartan  (AVAPRO ) 150 MG tablet Take 1 tablet (150 mg total) by mouth daily. 90 tablet 3   levocetirizine (XYZAL ) 5 MG tablet Take 1 tablet (5 mg total) by mouth in the morning and at bedtime. 60 tablet 5   montelukast  (SINGULAIR ) 10 MG tablet TAKE 1 TABLET(10 MG) BY MOUTH AT BEDTIME 90 tablet 1   Nebulizers (PARI PRONEB MAX LC SPRINT) MISC      omeprazole  (PRILOSEC) 40 MG capsule Take 1 capsule (40 mg total) by mouth  daily. 90 capsule 3   ondansetron  (ZOFRAN -ODT) 4 MG disintegrating tablet DISSOLVE 1 TABLET(4 MG) ON THE TONGUE EVERY 8 HOURS AS NEEDED FOR NAUSEA OR VOMITING 30 tablet 1   Skin Protectants, Misc. (EUCERIN) cream Apply 1 Application topically daily as needed for dry skin.     tacrolimus  (PROTOPIC ) 0.03 % ointment Apply 1 Application topically 2 (two) times daily as needed (rash). (Patient taking differently: Apply 1 Application topically 2 (two) times daily as needed (recurrent rash).) 60 g 5   triamcinolone  ointment (KENALOG ) 0.1 % Apply 1 Application topically at bedtime. 30 g 5   Vibegron  (GEMTESA ) 75 MG TABS Take 1 tablet (75 mg total) by mouth daily. 30 tablet 11   Current Facility-Administered Medications on File Prior to Visit  Medication Dose Route Frequency Provider Last Rate Last Admin   benralizumab  (FASENRA ) prefilled syringe 30 mg  30 mg Subcutaneous Q8 Weeks Gallagher, Joel Louis, MD   30 mg at 07/23/24 1337        ROS:  All others reviewed and negative.  Objective        PE:  BP 112/80   Pulse 85   Temp (!) 97.4 F (36.3 C)   Ht 5' 3 (1.6 m)   Wt 187 lb 6.4 oz (85 kg)   SpO2 95%   BMI 33.20 kg/m                 Constitutional: Pt appears in NAD               HENT: Head: NCAT.                Right Ear: External ear normal.                 Left Ear: External ear normal.                Eyes: . Pupils are equal, round,  and reactive to light. Conjunctivae and EOM are normal               Nose: without d/c or deformity               Neck: Neck supple. Gross normal ROM               Cardiovascular: Normal rate and regular rhythm.                 Pulmonary/Chest: Effort normal and breath sounds without rales or wheezing.                Abd:  Soft, NT, ND, + BS, no organomegaly               Neurological: Pt is alert. At baseline orientation, motor grossly intact               Skin: Skin is warm. No rashes, no other new lesions, LE edema - none               Psychiatric:  Pt behavior is normal without agitation   Micro: none  Cardiac tracings I have personally interpreted today:  none  Pertinent Radiological findings (summarize): none   Lab Results  Component Value Date   WBC 10.1 05/25/2024   HGB 10.0 (L) 05/25/2024   HCT 32.1 (L) 05/25/2024   PLT 386 05/25/2024   GLUCOSE 99 05/25/2024   CHOL 171 12/25/2023   TRIG 64.0 12/25/2023   HDL 77.50 12/25/2023   LDLCALC 80 12/25/2023   ALT 15 05/25/2024   AST 15 05/25/2024   NA 143 05/25/2024   K 4.0 05/25/2024   CL 103 05/25/2024   CREATININE 0.91 05/25/2024   BUN 18 05/25/2024   CO2 32 05/25/2024   TSH 0.53 12/25/2023   INR 0.9 03/14/2023   HGBA1C 6.0 12/25/2023   Assessment/Plan:  Yenesis Even is a 63 y.o. Black or African American [2] female with  has a past medical history of Allergy , Anemia, iron  deficiency (12/22/2014), Anxiety, Arthritis, ASTHMA (05/12/2009), Asthma, Bipolar disorder (HCC), COPD (chronic obstructive pulmonary disease) (HCC), Depression, Eczema (05/18/2022), Emphysema of lung (HCC), Fibromyalgia (05/14/2014), GERD (gastroesophageal reflux disease), History of kidney stones, Hyperlipidemia (04/20/2017), HYPERTENSION (05/12/2009), Osteoporosis, Peripheral vascular disease (HCC), Pneumonia, Prediabetes (02/23/2014), Seizure (HCC), Sleep apnea, Stroke (HCC) (11/2020), Urticaria, and Vitamin D  deficiency.  Intra-abdominal and pelvic swelling, mass and lump, unspecified site Pt for MRI w wo CM abdomen, r/o liver mass  Cirrhosis (HCC) No ascites, compensated,  to f/u any worsening symptoms or concerns  Asthma-COPD overlap syndrome Stable, cont inhaler, cont pulm rehab  Vitamin D  deficiency Last vitamin D  Lab Results  Component Value Date   VD25OH 36.15 12/25/2023   Low, to start oral replacement  Followup: Return in about 6 months (around 02/23/2025).  Lynwood Rush, MD 08/26/2024 7:54 PM [facility] Internal  Medicine

## 2024-08-27 ENCOUNTER — Encounter (HOSPITAL_COMMUNITY)
Admission: RE | Admit: 2024-08-27 | Discharge: 2024-08-27 | Disposition: A | Discharge status: Home / Self Care | Source: Ambulatory Visit | Attending: Pulmonary Disease | Admitting: Pulmonary Disease

## 2024-08-27 VITALS — Wt 189.2 lb

## 2024-08-27 DIAGNOSIS — J449 Chronic obstructive pulmonary disease, unspecified: Secondary | ICD-10-CM | POA: Diagnosis not present

## 2024-08-27 NOTE — Progress Notes (Signed)
 Daily Session Note  Patient Details  Name: [name] MRN: [id number] Date of Birth: [birth year] Referring Provider:   Conrad Ports Pulmonary Rehab Walk Test [facility]10/20[facility]  Referring Provider Dr. Fairy Danker ([facility])    Encounter Date: 08/27/2024  Check In:  Session Check In - 08/27/24 1037       Check-In   Supervising physician immediately available to respond to emergencies [facility] MD immediately available    Physician(s) Orren Fabry, PA    Location [facility]    Staff Present [name], MS, ACSM-CEP, Exercise Physiologist;Willye Javier Claudene Candia Levin, RN, BSN;Randi Reeve BS, ACSM-CEP, Exercise Physiologist    Virtual Visit No    Medication changes reported     No    Fall or balance concerns reported    No    Comments fall in last 6 months, knees just give out, uses walker when out of house    Tobacco Cessation No Change    Warm-up and Cool-down Performed as group-led instruction    Resistance Training Performed Yes    VAD Patient? No    PAD/SET Patient? No      Pain Assessment   Currently in Pain? No/denies          Capillary Blood Glucose: No results found. However, due to the size of the patient record, not all encounters were searched. Please check Results Review for a complete set of results.    Social History   Tobacco Use  Smoking Status Former   Current packs/day: 0.00   Average packs/day: 0.5 packs/day for 40.0 years (20.0 ttl pk-yrs)   Types: Cigarettes   Start date: 17   Quit date: 04/2018   Years since quitting: 6.3   Passive exposure: Never  Smokeless Tobacco Never  Tobacco Comments   Don't Smoke    Goals Met:  Proper associated with RPD/PD & O2 Sat Independence with exercise equipment Exercise tolerated well No report of concerns or symptoms today Strength training completed today  Goals Unmet:  Not Applicable  Comments:  Service time is from 1006 to 1142.    Dr. Slater Staff is Medical Director for Pulmonary Rehab at [facility].

## 2024-08-28 NOTE — Telephone Encounter (Signed)
 Spoke with patient and advised results. All question (if any) answered.

## 2024-09-01 ENCOUNTER — Encounter: Payer: Self-pay | Admitting: Internal Medicine

## 2024-09-01 ENCOUNTER — Telehealth (HOSPITAL_COMMUNITY): Payer: Self-pay

## 2024-09-01 ENCOUNTER — Encounter (HOSPITAL_COMMUNITY)

## 2024-09-01 NOTE — Telephone Encounter (Signed)
 Received VM from pt calling out. Pt is having breathing issues.

## 2024-09-02 ENCOUNTER — Telehealth (HOSPITAL_COMMUNITY): Payer: Self-pay

## 2024-09-02 ENCOUNTER — Ambulatory Visit

## 2024-09-02 DIAGNOSIS — J4550 Severe persistent asthma, uncomplicated: Secondary | ICD-10-CM

## 2024-09-02 NOTE — Telephone Encounter (Signed)
 Patient's daughter called stating patient is not feeling well and won't be in for 10:15 PR class on 9/18.

## 2024-09-03 ENCOUNTER — Encounter (HOSPITAL_COMMUNITY)

## 2024-09-07 ENCOUNTER — Telehealth (HOSPITAL_COMMUNITY): Payer: Self-pay

## 2024-09-07 NOTE — Telephone Encounter (Signed)
 Called pt to check on her after recent increase of shortness of breath. Pt feels better and plans to come to [place] tomorrow.

## 2024-09-08 ENCOUNTER — Encounter (HOSPITAL_COMMUNITY)
Admission: RE | Admit: 2024-09-08 | Discharge: 2024-09-08 | Disposition: A | Discharge status: Home / Self Care | Source: Ambulatory Visit | Attending: Pulmonary Disease | Admitting: Pulmonary Disease

## 2024-09-08 VITALS — Wt 191.6 lb

## 2024-09-08 DIAGNOSIS — J449 Chronic obstructive pulmonary disease, unspecified: Secondary | ICD-10-CM

## 2024-09-08 NOTE — Progress Notes (Signed)
 Daily Session Note  Patient Details  Name: [name] MRN: [id number] Date of Birth: 1960/12/24 Referring Provider:   Conrad Ports Pulmonary Rehab Walk Test from 07/10/2024 in [facility]  Referring Provider Dr. Fairy Danker ([facility])    Encounter Date: 09/08/2024  Check In:  Session Check In - 09/08/24 1103       Check-In   Supervising physician immediately available to respond to emergencies [facility] MD immediately available    Physician(s) Damien Braver, NP    Location [facility]    Staff Present [name], MS, ACSM-CEP, Exercise Physiologist;Nyashia Raney Claudene Candia Levin, RN, BSN;Randi Reeve BS, ACSM-CEP, Exercise Physiologist    Virtual Visit No    Medication changes reported     No    Fall or balance concerns reported    No    Tobacco Cessation No Change    Warm-up and Cool-down Performed as group-led instruction    Resistance Training Performed Yes    VAD Patient? No    PAD/SET Patient? No      Pain Assessment   Currently in Pain? No/denies          Capillary Blood Glucose: No results found. However, due to the size of the patient record, not all encounters were searched. Please check Results Review for a complete set of results.    Social History   Tobacco Use  Smoking Status Former   Current packs/day: 0.00   Average packs/day: 0.5 packs/day for 40.0 years (20.0 ttl pk-yrs)   Types: Cigarettes   Start date: 46   Quit date: 04/2018   Years since quitting: 6.4   Passive exposure: Never  Smokeless Tobacco Never  Tobacco Comments   Don't Smoke    Goals Met:  Proper associated with RPD/PD & O2 Sat Independence with exercise equipment Exercise tolerated well No report of concerns or symptoms today Strength training completed today  Goals Unmet:  Not Applicable  Comments: Service time is from 1007 to 1140.    Dr. Slater Staff is Medical Director for Pulmonary  Rehab at [facility].

## 2024-09-08 NOTE — Patient Instructions (Incomplete)
 1. Asthma-COPD overlap syndrome-with acute exacerbation -Start prednisone  10 mg taking 2 tablets twice a day for 3 days, then on the 4th day take 2 tablets in the morning, and on the 5th day take 1 tablet and stop. If symptoms do not get better please go to the Emergency Room - Also if the pressure on chest does not get better please go to the Emergency Room - Continue to follow up with pulmonology - Daily controller medication(s): Breztri  two puffs twice daily + Ohtuvayre  nebulizer twice daily + Fasenra  every every 8 weeks  + azithromycin  three times weekly until we see you again (Mon/Wed/Fri). - Prior to physical activity: Ventolin  (GRAY INHALER) 2 puffs 10-15 minutes before physical activity. - Rescue medications: albuterol  nebulizer one vial every 4-6 hours as needed OR Ventolin  (GRAY INHALER) 2 puffs AND 2 puffs Atrovent  (GREEN INHALER) every 4-6 hours as needed - Asthma control goals:  * Full participation in all desired activities (may need albuterol  before activity) * Albuterol  use two time or less a week on average (not counting use with activity) * Cough interfering with sleep two time or less a month * Oral steroids no more than once a year * No hospitalizations  2. Chronic urticaria -not well controlled Stop Benadryl  - Start Xyzal  (levocetirizine) twice daily EVERY DAY.  - Continue with famotidine  twice daily EVERY DAY.  - Continue with Doxepin  25mg  at night to help with itching AS NEEDED.  - Continue with triamcinolone  twice daily as needed for your lesion.   3. 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 - Continue to hold the allergy  shots.   4. GERD - Continue famotidine  daily . - Continue omeprazole  daily.   Schedule an appointment with your primary care physician to discuss the pain behind your left ear that occurs at times for the past month 5. Follow up in 2 months with Dr. Iva or  soooner

## 2024-09-09 ENCOUNTER — Encounter: Payer: Self-pay | Admitting: Family

## 2024-09-09 ENCOUNTER — Ambulatory Visit (INDEPENDENT_AMBULATORY_CARE_PROVIDER_SITE_OTHER): Admitting: Family

## 2024-09-09 ENCOUNTER — Other Ambulatory Visit: Payer: Self-pay

## 2024-09-09 VITALS — HR 77 | Temp 97.6°F

## 2024-09-09 DIAGNOSIS — J3089 Other allergic rhinitis: Secondary | ICD-10-CM

## 2024-09-09 DIAGNOSIS — L508 Other urticaria: Secondary | ICD-10-CM | POA: Diagnosis not present

## 2024-09-09 DIAGNOSIS — J441 Chronic obstructive pulmonary disease with (acute) exacerbation: Secondary | ICD-10-CM | POA: Diagnosis not present

## 2024-09-09 DIAGNOSIS — K219 Gastro-esophageal reflux disease without esophagitis: Secondary | ICD-10-CM

## 2024-09-09 MED ORDER — PREDNISONE 10 MG PO TABS: ORAL_TABLET | ORAL | 0 refills | Status: DC

## 2024-09-09 MED ORDER — LEVOCETIRIZINE DIHYDROCHLORIDE 5 MG PO TABS: 5.0000 mg | ORAL_TABLET | Freq: Two times a day (BID) | ORAL | 5 refills | Status: DC

## 2024-09-09 NOTE — Addendum Note (Signed)
 Addended by: NANCEE JON SAILOR on: 09/09/2024 11:45 AM   Modules accepted: Orders

## 2024-09-09 NOTE — Progress Notes (Signed)
 [address] [place] [address] Dept: [phone]  FOLLOW UP NOTE  Patient ID: [name], female    DOB: 17-Apr-1961  Age: 63 y.o. MRN: [id number] Date of Office Visit: 09/09/2024  Assessment  Chief Complaint: Acute Visit (Shortness of breath/Earache left ear), Urticaria, Pruritus, and Angioedema (Face and feet with itch)  HPI [name] is a 63 year old female who presents today for acute visit of shortness of breath and itching.  She was last seen on August 19, 2024 by myself for follow-up of asthma with COPD, recurrent infections, chronic urticaria, perennial allergic rhinitis, gastroesophageal reflux disease..  She reports that since her last office visit she had a stress test and on October 4 she is having an MRI due to an area on her liver.  NM PET CT cardiac perfusion on August 19, 2024 shows:  IMPRESSION: 1. Cirrhosis. Vague small hypodense lesion in the left hepatic lobe, difficult to further characterize due to size, streak artifact and lack of postcontrast imaging. Consider MR abdomen without and with contrast in further evaluation, as clinically indicated. 2.  Aortic atherosclerosis (ICD10-I70.0). 3.  Emphysema (ICD10-J43.9).  Asthma-COPD overlap syndrome: She reports that since the weekend her breathing has been worse.  She tried to mostly stay in bed.  She reports shortness of breath, nocturnal awakenings, and tightness in her chest like a baby is sitting on it.  She denies fever, chills, wheezing, coughing, chest pain, jaw pain, and pain down her arm.  She noticed yesterday during pulmonary rehab that it was more difficult to complete.  She had a hard time doing the laps that she normally does.  Also her oxygen  level was lower than normal.  Her oxygen  level was around 94 -95.  This morning she woke up around 2 or 3:00 and felt like she could not breathe.  She used her nebulizer treatment and it helped.  Since her last office visit she  has not required any systemic steroids or made any trips to the emergency room or urgent care due to breathing problems.  She does not know how many rounds of steroids she is received this year due to her breathing.  She continues to take Breztri  2 puffs twice a day, Ohtuvayre  nebulizer twice a day, Fasenra  injection every 8 weeks, and azithromycin  3 days a week.  She does not have a rescue inhaler because she has been using it too much and it is too soon to get.  She has been using her albuterol  nebulizer treatment 2-3 times a day.  Chronic urticaria: She reports itching all over, swelling of her eyes and her feet that have gotten better.  The swelling in her feet got better after taking her diuretic.  She also reports that she had hives on her elbow region.  Then she used a cream that was prescribed by our office and it helped.  She has been taking Benadryl .  She reports that she is not taking any other antihistamine such as Claritin  (loratadine ), Zyrtec  (cetirizine ) Xyzal  (levocetirizine), or Allegra (fexofenadine).  She thinks that she must be out of antihistamines.  She does take doxepin  at night as needed.  Mixed rhinitis/perennial allergic rhinitis: She reports for the past month she has had off-and-on pain behind her left ear.  The pain occurs more in the morning and then again at night.  She has spoken with her primary care physician about this last week was told that they did not see any infection.  She describes the  feeling as feeling like she got hit in the head with a hammer.  Gastroesophageal reflux disease.  She continues to take famotidine  twice a day and omeprazole  once a day.  She mentions on Saturday she felt a sharp pain on the left side of her chest.  She took some of her medicine for acid reflux and it went away.  She has not received her CPAP yet.   Drug Allergies:  Allergies  Allergen Reactions   Sulfa Antibiotics Nausea And Vomiting and Other (See Comments)   Venofer  [Iron   Sucrose] Hives    Review of Systems: Negative except as per HPI  Physical Exam: Pulse 77   Temp 97.6 F (36.4 C)   SpO2 95%    Physical Exam Constitutional:      Appearance: Normal appearance.  HENT:     Head: Normocephalic and atraumatic.     Right Ear: Tympanic membrane, ear canal and external ear normal.     Left Ear: Tympanic membrane, ear canal and external ear normal.     Nose: Nose normal.     Mouth/Throat:     Mouth: Mucous membranes are moist.     Pharynx: Oropharynx is clear.  Eyes:     Conjunctiva/sclera: Conjunctivae normal.  Cardiovascular:     Rate and Rhythm: Regular rhythm.     Heart sounds: Normal heart sounds.  Pulmonary:     Effort: Pulmonary effort is normal.     Comments: Lung sounds diminished and expiratory wheezing heard. Able to speak in full sentences Musculoskeletal:     Cervical back: Neck supple.  Skin:    General: Skin is warm.     Comments: No urticarial lesions noted. Small ecchymosis areas noted on left forearm region  Neurological:     Mental Status: She is alert and oriented to person, place, and time.  Psychiatric:        Mood and Affect: Mood normal.        Behavior: Behavior normal.        Thought Content: Thought content normal.        Judgment: Judgment normal.     Diagnostics: FVC 1.38 L (53%), FEV1 0.73 L (35%), FEV1/FVC 0.53.  Spirometry indicates moderate airway obstruction.  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  Assessment and Plan: 1. Chronic urticaria   2. Asthma with COPD with exacerbation (HCC)   3. Perennial allergic rhinitis   4. Gastroesophageal reflux disease, unspecified whether esophagitis present     Meds ordered this encounter  Medications   levocetirizine (XYZAL ) 5 MG tablet    Sig: Take 1 tablet (5 mg total) by mouth in the morning and at bedtime.    Dispense:  60 tablet    Refill:  5   predniSONE  (DELTASONE ) 10 MG tablet    Sig: Take 2 tablets twice a day for 3 days,then on the 4th day take 2 tablets in the  morning, and on the 5th day take one tablet and stop    Dispense:  15 tablet    Refill:  0    Patient Instructions  1. Asthma-COPD overlap syndrome-with acute exacerbation -Start prednisone  10 mg taking 2 tablets twice a day for 3 days, then on the 4th day take 2 tablets in the morning, and on the 5th day take 1 tablet and stop. If symptoms do not get better please go to the Emergency Room - Also if the pressure on chest does not get better please go to the Emergency Room - Continue to follow  up with pulmonology - Daily controller medication(s): Breztri  two puffs twice daily + Ohtuvayre  nebulizer twice daily + Fasenra  every every 8 weeks  + azithromycin  three times weekly until we see you again (Mon/Wed/Fri). - Prior to physical activity: Ventolin  (GRAY INHALER) 2 puffs 10-15 minutes before physical activity. - Rescue medications: albuterol  nebulizer one vial every 4-6 hours as needed OR Ventolin  (GRAY INHALER) 2 puffs AND 2 puffs Atrovent  (GREEN INHALER) every 4-6 hours as needed - Asthma control goals:  * Full participation in all desired activities (may need albuterol  before activity) * Albuterol  use two time or less a week on average (not counting use with activity) * Cough interfering with sleep two time or less a month * Oral steroids no more than once a year * No hospitalizations  2. Chronic urticaria -not well controlled Stop Benadryl  - Start Xyzal  (levocetirizine) twice daily EVERY DAY.  - Continue with famotidine  twice daily EVERY DAY.  - Continue with Doxepin  25mg  at night to help with itching AS NEEDED.  - Continue with triamcinolone  twice daily as needed for your lesion.   3. 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 - Continue to hold the allergy  shots.   4. GERD - Continue famotidine  daily . - Continue omeprazole  daily.   Schedule an appointment with your primary care physician to discuss the  pain behind your left ear that occurs at times for the past month 5. Follow up in 2 months with Dr. Iva or soooner  Return in about 2 months (around 11/09/2024), or if symptoms worsen or fail to improve.    Thank you for the opportunity to care for this patient.  Please do not hesitate to contact me with questions.  Wanda Craze, FNP [facility] 

## 2024-09-10 ENCOUNTER — Encounter (HOSPITAL_COMMUNITY): Admission: RE | Admit: 2024-09-10 | Source: Ambulatory Visit

## 2024-09-10 ENCOUNTER — Other Ambulatory Visit: Payer: Self-pay | Admitting: Allergy

## 2024-09-10 ENCOUNTER — Telehealth (HOSPITAL_COMMUNITY): Payer: Self-pay

## 2024-09-10 NOTE — Telephone Encounter (Signed)
 Patient's daughter left message stating patient will not be in for 10:15 PR class, she started prednisone  yesterday for some itching and it's making her not feel well.

## 2024-09-13 ENCOUNTER — Other Ambulatory Visit: Payer: Self-pay | Admitting: Allergy & Immunology

## 2024-09-15 ENCOUNTER — Encounter (HOSPITAL_COMMUNITY): Admission: RE | Admit: 2024-09-15 | Source: Ambulatory Visit

## 2024-09-15 ENCOUNTER — Telehealth (HOSPITAL_COMMUNITY): Payer: Self-pay

## 2024-09-15 NOTE — Telephone Encounter (Signed)
 Patient c/o for 10:15 PR class, states she is finishing her last dose of prednisone  and will be ready to attend rehab again on Thursday. She would like to make up her missed classes.

## 2024-09-15 NOTE — Telephone Encounter (Signed)
 Called pt back. I told [name] that I did give her make up sessions and added 4 extra sessions in case she misses more appts. I left VM notifying her of this with call back number.

## 2024-09-16 ENCOUNTER — Ambulatory Visit: Admitting: Family

## 2024-09-16 NOTE — Progress Notes (Signed)
 Pulmonary Individual Treatment Plan  Patient Details  Name: [name] MRN: [id number] Date of Birth: June 30, 1961 Referring Provider:   Conrad Ports Pulmonary Rehab Walk Test from 07/10/2024 in [facility]  Referring Provider Dr. Fairy Danker ([facility])    Initial Encounter Date:  Flowsheet Row Pulmonary Rehab Walk Test from 07/10/2024 in [facility]  Date 07/10/24    Visit Diagnosis: Stage 3 severe COPD by GOLD classification (HCC)  Patient's Home Medications on Admission:   Current Outpatient Medications:    AIRSUPRA  90-80 MCG/ACT AERO, SMARTSIG:2 Puff(s) Via Inhaler 4 Times Daily PRN, Disp: , Rfl:    albuterol  (PROVENTIL ) (2.5 MG/3ML) 0.083% nebulizer solution, USE 1 VIAL VIA NEBULIZER EVERY 4 HOURS AS NEEDED FOR WHEEZING OR SHORTNESS OF BREATH, Disp: 75 mL, Rfl: 1   albuterol  (VENTOLIN  HFA) 108 (90 Base) MCG/ACT inhaler, INHALE 2 PUFFS INTO THE LUNGS EVERY 6 HOURS AS NEEDED FOR WHEEZING OR SHORTNESS OF BREATH, Disp: 18 g, Rfl: 1   amLODipine  (NORVASC ) 5 MG tablet, Take 1 tablet (5 mg total) by mouth daily., Disp: 90 tablet, Rfl: 3   ARIPiprazole  (ABILIFY ) 10 MG tablet, Take 1 tablet (10 mg total) by mouth daily. Take Abilify  20 mg po daily for 2 weeks till 9/17 , then continue taking 10 mg po daily, Disp: 30 tablet, Rfl: 1   Aspirin -Salicylamide-Caffeine  (BC HEADACHE POWDER PO), Take 1 packet by mouth daily., Disp: , Rfl:    atorvastatin  (LIPITOR) 80 MG tablet, Take 1 tablet (80 mg total) by mouth daily., Disp: 90 tablet, Rfl: 1   azelastine  (OPTIVAR ) 0.05 % ophthalmic solution, Place 1 drop into both eyes 2 (two) times daily., Disp: 6 mL, Rfl: 12   benralizumab  (FASENRA  PEN) 30 MG/ML prefilled autoinjector, Inject 1 mL (30 mg total) into the skin every 28 (twenty-eight) days. For 3 doses then every 8 weeks, Disp: 1 mL, Rfl: 9   BREZTRI  AEROSPHERE 160-9-4.8  MCG/ACT AERO inhaler, INHALE 2 PUFFS INTO THE LUNGS IN THE MORNING AND AT BEDTIME, Disp: 10.7 g, Rfl: 11   budesonide  (PULMICORT ) 0.5 MG/2ML nebulizer solution, USE 2 ML(0.5 MG) VIA NEBULIZER IN THE MORNING AND AT BEDTIME, Disp: 120 mL, Rfl: 2   carbamazepine  (TEGRETOL ) 200 MG tablet, Take 2-3 tablets (400-600 mg total) by mouth See admin instructions. Take 400mg  in the morning and 600mg  at bedtime., Disp: 150 tablet, Rfl: 2   cromolyn  (OPTICROM ) 4 % ophthalmic solution, Place 1 drop into both eyes 4 (four) times daily., Disp: 10 mL, Rfl: 12   doxepin  (SINEQUAN ) 25 MG capsule, TAKE 1 CAPSULE(25 MG) BY MOUTH AT BEDTIME AS NEEDED, Disp: 30 capsule, Rfl: 5   Ensifentrine  (OHTUVAYRE ) 3 MG/2.5ML SUSP, Inhale 3 mg into the lungs in the morning and at bedtime., Disp: 150 mL, Rfl: 11   estradiol  (ESTRACE ) 0.1 MG/GM vaginal cream, Place 1g nightly 2-3 times a week, Disp: 30 g, Rfl: 3   famotidine  (PEPCID ) 40 MG tablet, Take 1 tablet (40 mg total) by mouth 2 (two) times daily., Disp: 60 tablet, Rfl: 5   ferrous sulfate  325 (65 FE) MG EC tablet, TAKE 1 TABLET(325 MG) BY MOUTH DAILY WITH BREAKFAST, Disp: 90 tablet, Rfl: 1   fluticasone  (FLONASE ) 50 MCG/ACT nasal spray, SHAKE LIQUID AND USE 1 SPRAY IN EACH NOSTRIL TWICE DAILY AS NEEDED FOR ALLERGIES OR RHINITIS, Disp: 16 g, Rfl: 5   formoterol  (PERFOROMIST ) 20 MCG/2ML nebulizer solution, Take 20  mcg by nebulization 2 (two) times daily., Disp: , Rfl:    furosemide  (LASIX ) 20 MG tablet, TAKE 1 TABLET BY MOUTH DAILY AS NEEDED, Disp: 30 tablet, Rfl: 5   hydrOXYzine  (VISTARIL ) 25 MG capsule, Take one tablet every 6-8 hours as needed for itching., Disp: 30 capsule, Rfl: 1   ipratropium (ATROVENT  HFA) 17 MCG/ACT inhaler, Inhale 2 puffs into the lungs every 4 (four) hours as needed for wheezing., Disp: 1 each, Rfl: 5   irbesartan  (AVAPRO ) 150 MG tablet, Take 1 tablet (150 mg total) by mouth daily., Disp: 90 tablet, Rfl: 3   levocetirizine (XYZAL ) 5 MG tablet, Take 1  tablet (5 mg total) by mouth in the morning and at bedtime., Disp: 60 tablet, Rfl: 5   montelukast  (SINGULAIR ) 10 MG tablet, TAKE 1 TABLET(10 MG) BY MOUTH AT BEDTIME, Disp: 90 tablet, Rfl: 1   Nebulizers (PARI PRONEB MAX LC SPRINT) MISC, , Disp: , Rfl:    omeprazole  (PRILOSEC) 40 MG capsule, Take 1 capsule (40 mg total) by mouth daily., Disp: 90 capsule, Rfl: 3   ondansetron  (ZOFRAN -ODT) 4 MG disintegrating tablet, DISSOLVE 1 TABLET(4 MG) ON THE TONGUE EVERY 8 HOURS AS NEEDED FOR NAUSEA OR VOMITING, Disp: 30 tablet, Rfl: 1   predniSONE  (DELTASONE ) 10 MG tablet, Take 2 tablets twice a day for 3 days,then on the 4th day take 2 tablets in the morning, and on the 5th day take one tablet and stop, Disp: 15 tablet, Rfl: 0   Skin Protectants, Misc. (EUCERIN) cream, Apply 1 Application topically daily as needed for dry skin., Disp: , Rfl:    tacrolimus  (PROTOPIC ) 0.03 % ointment, Apply 1 Application topically 2 (two) times daily as needed (rash). (Patient taking differently: Apply 1 Application topically 2 (two) times daily as needed (recurrent rash).), Disp: 60 g, Rfl: 5   triamcinolone  ointment (KENALOG ) 0.1 %, Apply 1 Application topically at bedtime., Disp: 30 g, Rfl: 5   Vibegron  (GEMTESA ) 75 MG TABS, Take 1 tablet (75 mg total) by mouth daily., Disp: 30 tablet, Rfl: 11  Current Facility-Administered Medications:    benralizumab  (FASENRA ) prefilled syringe 30 mg, 30 mg, Subcutaneous, Q8 Weeks, Iva Marty Saltness, MD, 30 mg at 09/02/24 9063  Past Medical History: Past Medical History:  Diagnosis Date   Allergy     Anemia, iron  deficiency 12/22/2014   pt. 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      Asthma    Bipolar disorder (HCC)    with anxiety, depression   COPD (chronic obstructive  pulmonary disease) (HCC)    Depression    Eczema 05/18/2022   Emphysema of lung (HCC)    Fibromyalgia 05/14/2014   GERD (gastroesophageal reflux disease)    History of kidney stones    Hyperlipidemia 04/20/2017   HYPERTENSION 05/12/2009   Qualifier: Diagnosis of  By: Primus LATHER LEODIS), [name]     Osteoporosis    Peripheral vascular disease    Pneumonia    Prediabetes 02/23/2014   pt. denies   Seizure (HCC)    Sleep apnea    Stroke (HCC) 11/2020   Urticaria    Vitamin D  deficiency     Tobacco Use: Social History   Tobacco Use  Smoking Status Former   Current packs/day: 0.00   Average packs/day: 0.5 packs/day for  40.0 years (20.0 ttl pk-yrs)   Types: Cigarettes   Start date: 33   Quit date: 04/2018   Years since quitting: 6.4   Passive exposure: Never  Smokeless Tobacco Never  Tobacco Comments   Don't Smoke    Labs: Review Flowsheet  More data exists      Latest Ref Rng & Units 06/18/2023 06/23/2023 08/11/2023 08/12/2023 12/25/2023  Labs for ITP Cardiac and Pulmonary Rehab  Cholestrol 0 - 200 mg/dL 795  - - - 828   LDL (calc) 0 - 99 mg/dL 888  - - - 80   HDL-C >60.99 mg/dL 23.19  - - - 22.49   Trlycerides 0.0 - 149.0 mg/dL 17.9  - - - 35.9   Hemoglobin A1c 4.6 - 6.5 % 5.9  - - - 6.0   Bicarbonate 20.0 - 28.0 mmol/L - 34.7  26.2  24.7  -  TCO2 22 - 32 mmol/L - 30  36  27  25  -  O2 Saturation % - 87  97  98  -    Details       Multiple values from one day are sorted in reverse-chronological order         Capillary Blood Glucose: Lab Results  Component Value Date   GLUCAP 128 (H) 08/06/2024   GLUCAP 108 (H) 08/20/2023   GLUCAP 99 08/20/2023   GLUCAP 126 (H) 08/19/2023   GLUCAP 103 (H) 08/19/2023     Pulmonary Assessment Scores:  Pulmonary Assessment Scores     Row Name 07/10/24 1145         ADL UCSD   ADL Phase Entry     SOB Score total 88       CAT Score   CAT Score 32       mMRC Score   mMRC Score 3       UCSD: Self-administered  rating of dyspnea associated with activities of daily living (ADLs) 6-point scale (0 = not at all to 5 = maximal or unable to do because of breathlessness)  Scoring Scores range from 0 to 120.  Minimally important difference is 5 units  CAT: CAT can identify the health impairment of COPD patients and is better correlated with disease progression.  CAT has a scoring range of zero to 40. The CAT score is classified into four groups of low (less than 10), medium (10 - 20), high (21-30) and very high (31-40) based on the impact level of disease on health status. A CAT score over 10 suggests significant symptoms.  A worsening CAT score could be explained by an exacerbation, poor medication adherence, poor inhaler technique, or progression of COPD or comorbid conditions.  CAT MCID is 2 points  mMRC: mMRC (Modified Medical Research Council) Dyspnea Scale is used to assess the degree of baseline functional disability in patients of respiratory disease due to dyspnea. No minimal important difference is established. A decrease in score of 1 point or greater is considered a positive change.   Pulmonary Function Assessment:  Pulmonary Function Assessment - 07/10/24 1145       Breath   Bilateral Breath Sounds Clear    Shortness of Breath Yes;Fear of Shortness of Breath;Limiting activity;Panic with Shortness of Breath          Exercise Target Goals: Exercise Program Goal: Individual exercise prescription set using results from initial 6 min walk test and THRR while considering  patient's activity barriers and safety.   Exercise Prescription Goal: Initial exercise prescription builds  to 30-45 minutes a day of aerobic activity, 2-3 days per week.  Home exercise guidelines will be given to patient during program as part of exercise prescription that the participant will acknowledge.  Activity Barriers & Risk Stratification:  Activity Barriers & Cardiac Risk Stratification - 07/10/24 1028        Activity Barriers & Cardiac Risk Stratification   Activity Barriers History of Falls;Balance Concerns;Deconditioning;Muscular Weakness;Shortness of Breath;Back Problems;Assistive Device          6 Minute Walk:  6 Minute Walk     Row Name 07/10/24 1130         6 Minute Walk   Phase Initial     Distance 1020 feet     Walk Time 6 minutes     # of Rest Breaks 1  desat to 85%, 2:36-2:44 92% after PLB     MPH 1.93     METS 2.65     RPE 11     Perceived Dyspnea  1     VO2 Peak 9.27     Symptoms Yes (comment)     Comments dyspnea     Resting HR 77 bpm     Resting BP 130/74     Resting Oxygen  Saturation  98 %     Exercise Oxygen  Saturation  during 6 min walk 85 %  O2 dropped to 85%, PLB up to 92%     Max Ex. HR 104 bpm     Max Ex. BP 144/80     2 Minute Post BP 120/76       Interval HR   1 Minute HR 93     2 Minute HR 99     3 Minute HR 101     4 Minute HR 102     5 Minute HR 101     6 Minute HR 104     2 Minute Post HR 72     Interval Heart Rate? Yes       Interval Oxygen    Interval Oxygen ? Yes     Baseline Oxygen  Saturation % 98 %     1 Minute Oxygen  Saturation % 95 %     1 Minute Liters of Oxygen  0 L     2 Minute Oxygen  Saturation % 88 %  2:36-2:44, desat to 85%, PLB and back up to 92%     2 Minute Liters of Oxygen  0 L     3 Minute Oxygen  Saturation % 92 %     3 Minute Liters of Oxygen  0 L     4 Minute Oxygen  Saturation % 96 %     4 Minute Liters of Oxygen  0 L     5 Minute Oxygen  Saturation % 95 %     5 Minute Liters of Oxygen  0 L     6 Minute Oxygen  Saturation % 98 %     6 Minute Liters of Oxygen  0 L     2 Minute Post Oxygen  Saturation % 97 %     2 Minute Post Liters of Oxygen  0 L        Oxygen  Initial Assessment:  Oxygen  Initial Assessment - 07/10/24 1030       Home Oxygen    Home Oxygen  Device None    Sleep Oxygen  Prescription None    Home Exercise Oxygen  Prescription None    Home Resting Oxygen  Prescription None      Initial 6 min Walk   Oxygen   Used None  Program Oxygen  Prescription   Program Oxygen  Prescription None      Intervention   Short Term Goals To learn and understand importance of maintaining oxygen  saturations>88%;To learn and understand importance of monitoring SPO2 with pulse oximeter and demonstrate accurate use of the pulse oximeter.;To learn and demonstrate proper pursed lip breathing techniques or other breathing techniques. ;To learn and demonstrate proper use of respiratory medications;To learn and exhibit compliance with exercise, home and travel O2 prescription    Long  Term Goals Exhibits compliance with exercise, home  and travel O2 prescription;Maintenance of O2 saturations>88%;Verbalizes importance of monitoring SPO2 with pulse oximeter and return demonstration;Exhibits proper breathing techniques, such as pursed lip breathing or other method taught during program session;Demonstrates proper use of MDI's;Compliance with respiratory medication          Oxygen  Re-Evaluation:  Oxygen  Re-Evaluation     Row Name 07/16/24 1545 08/12/24 0912 09/07/24 0935         Program Oxygen  Prescription   Program Oxygen  Prescription None None None       Home Oxygen    Home Oxygen  Device None None None     Sleep Oxygen  Prescription None None None     Home Exercise Oxygen  Prescription None None None     Home Resting Oxygen  Prescription None None None       Goals/Expected Outcomes   Short Term Goals To learn and understand importance of maintaining oxygen  saturations>88%;To learn and understand importance of monitoring SPO2 with pulse oximeter and demonstrate accurate use of the pulse oximeter.;To learn and demonstrate proper pursed lip breathing techniques or other breathing techniques. ;To learn and demonstrate proper use of respiratory medications;To learn and exhibit compliance with exercise, home and travel O2 prescription To learn and understand importance of maintaining oxygen  saturations>88%;To learn and  understand importance of monitoring SPO2 with pulse oximeter and demonstrate accurate use of the pulse oximeter.;To learn and demonstrate proper pursed lip breathing techniques or other breathing techniques. ;To learn and demonstrate proper use of respiratory medications;To learn and exhibit compliance with exercise, home and travel O2 prescription To learn and understand importance of maintaining oxygen  saturations>88%;To learn and understand importance of monitoring SPO2 with pulse oximeter and demonstrate accurate use of the pulse oximeter.;To learn and demonstrate proper pursed lip breathing techniques or other breathing techniques. ;To learn and demonstrate proper use of respiratory medications;To learn and exhibit compliance with exercise, home and travel O2 prescription     Long  Term Goals Exhibits compliance with exercise, home  and travel O2 prescription;Maintenance of O2 saturations>88%;Verbalizes importance of monitoring SPO2 with pulse oximeter and return demonstration;Exhibits proper breathing techniques, such as pursed lip breathing or other method taught during program session;Demonstrates proper use of MDI's;Compliance with respiratory medication Exhibits compliance with exercise, home  and travel O2 prescription;Maintenance of O2 saturations>88%;Verbalizes importance of monitoring SPO2 with pulse oximeter and return demonstration;Exhibits proper breathing techniques, such as pursed lip breathing or other method taught during program session;Demonstrates proper use of MDI's;Compliance with respiratory medication Exhibits compliance with exercise, home  and travel O2 prescription;Maintenance of O2 saturations>88%;Verbalizes importance of monitoring SPO2 with pulse oximeter and return demonstration;Exhibits proper breathing techniques, such as pursed lip breathing or other method taught during program session;Demonstrates proper use of MDI's;Compliance with respiratory medication     Goals/Expected  Outcomes Compliance and understanding of oxygen  saturation monitoring and breathing techniques to decrease shortness of breath. Compliance and understanding of oxygen  saturation monitoring and breathing techniques to decrease shortness of breath. Compliance and understanding of oxygen   saturation monitoring and breathing techniques to decrease shortness of breath.        Oxygen  Discharge (Final Oxygen  Re-Evaluation):  Oxygen  Re-Evaluation - 09/07/24 0935       Program Oxygen  Prescription   Program Oxygen  Prescription None      Home Oxygen    Home Oxygen  Device None    Sleep Oxygen  Prescription None    Home Exercise Oxygen  Prescription None    Home Resting Oxygen  Prescription None      Goals/Expected Outcomes   Short Term Goals To learn and understand importance of maintaining oxygen  saturations>88%;To learn and understand importance of monitoring SPO2 with pulse oximeter and demonstrate accurate use of the pulse oximeter.;To learn and demonstrate proper pursed lip breathing techniques or other breathing techniques. ;To learn and demonstrate proper use of respiratory medications;To learn and exhibit compliance with exercise, home and travel O2 prescription    Long  Term Goals Exhibits compliance with exercise, home  and travel O2 prescription;Maintenance of O2 saturations>88%;Verbalizes importance of monitoring SPO2 with pulse oximeter and return demonstration;Exhibits proper breathing techniques, such as pursed lip breathing or other method taught during program session;Demonstrates proper use of MDI's;Compliance with respiratory medication    Goals/Expected Outcomes Compliance and understanding of oxygen  saturation monitoring and breathing techniques to decrease shortness of breath.          Initial Exercise Prescription:  Initial Exercise Prescription - 07/10/24 1100       Date of Initial Exercise RX and Referring Provider   Date 07/10/24    Referring Provider Dr. Fairy Danker ([facility])     Expected Discharge Date 07/10/24      NuStep   Level 2    SPM 60    Minutes 15    METs 1.8      Track   Laps 15    Minutes 15    METs 2.04      Prescription Details   Frequency (times per week) 2    Duration Progress to 30 minutes of continuous aerobic without signs/symptoms of physical distress      Intensity   THRR 40-80% of Max Heartrate 64-127    Ratings of Perceived Exertion 11-13    Perceived Dyspnea 0-4      Progression   Progression Continue to progress workloads to maintain intensity without signs/symptoms of physical distress.      Resistance Training   Training Prescription Yes    Weight red bands    Reps 10-15          Perform Capillary Blood Glucose checks as needed.  Exercise Prescription Changes:   Exercise Prescription Changes     Row Name 07/21/24 1100 08/04/24 1200 08/18/24 1200 08/27/24 1204 09/08/24 0944     Response to Exercise   Blood Pressure (Admit) 112/70 120/70 132/68 110/70 118/72   Blood Pressure (Exercise) 118/70 128/70 142/70 -- --   Blood Pressure (Exit) 104/64 104/68 128/78 112/80 120/68   Heart Rate (Admit) 85 bpm 80 bpm 95 bpm 79 bpm 75 bpm   Heart Rate (Exercise) 104 bpm 93 bpm 88 bpm 110 bpm 97 bpm   Heart Rate (Exit) 86 bpm 68 bpm 82 bpm 85 bpm 75 bpm   Oxygen  Saturation (Admit) 94 % 98 % 96 % 97 % 96 %   Oxygen  Saturation (Exercise) 94 % 94 % 96 % 93 % 91 %   Oxygen  Saturation (Exit) 95 % 97 % 98 % 96 % 97 %   Rating of Perceived Exertion (Exercise) 11 10  10 12 13    Perceived Dyspnea (Exercise) 2 1 1 2 1    Duration Progress to 30 minutes of  aerobic without signs/symptoms of physical distress Progress to 30 minutes of  aerobic without signs/symptoms of physical distress Continue with 30 min of aerobic exercise without signs/symptoms of physical distress. Continue with 30 min of aerobic exercise without signs/symptoms of physical distress. Continue with 30 min of aerobic exercise without signs/symptoms of physical  distress.   Intensity THRR unchanged THRR unchanged THRR unchanged THRR unchanged THRR unchanged     Progression   Progression Continue to progress workloads to maintain intensity without signs/symptoms of physical distress. Continue to progress workloads to maintain intensity without signs/symptoms of physical distress. Continue to progress workloads to maintain intensity without signs/symptoms of physical distress. Continue to progress workloads to maintain intensity without signs/symptoms of physical distress. Continue to progress workloads to maintain intensity without signs/symptoms of physical distress.     Resistance Training   Training Prescription Yes Yes Yes Yes Yes   Weight red bands red bands red bands red bands red bands   Reps 10-15 10-15 10-15 10-15 10-15   Time 10 Minutes 10 Minutes 10 Minutes 10 Minutes 10 Minutes     Interval Training   Interval Training No -- -- -- --     NuStep   Level 2 2 2 3 3    SPM 67 78 80 -- --   Minutes 15 215 15 15 15    METs 1.8 2.1 2.1 2 1.9     Track   Laps 18 21 16 8 13    Minutes 15 15 15 15 15    METs 2.25 2.46 2.1 1.14 1.91      Exercise Comments:   Exercise Comments     Row Name 07/16/24 1150           Exercise Comments Pt completed her first day of group exercise. She exercised on the recumbent stepper for 15 min, level 2, METs 1.7. She then walked the track for 15 min, 1.84 METs. She felt dizzy and tired walking and needed rest stops. Encouraged hydration. Performed warm up and cool down standing including squats. Discussed METs with fair reception.          Exercise Goals and Review:   Exercise Goals     Row Name 07/10/24 1029             Exercise Goals   Increase Physical Activity Yes       Intervention Provide advice, education, support and counseling about physical activity/exercise needs.;Develop an individualized exercise prescription for aerobic and resistive training based on initial evaluation findings,  risk stratification, comorbidities and participant's personal goals.       Expected Outcomes Short Term: Attend rehab on a regular basis to increase amount of physical activity.;Long Term: Exercising regularly at least 3-5 days a week.;Long Term: Add in home exercise to make exercise part of routine and to increase amount of physical activity.       Increase Strength and Stamina Yes       Intervention Provide advice, education, support and counseling about physical activity/exercise needs.;Develop an individualized exercise prescription for aerobic and resistive training based on initial evaluation findings, risk stratification, comorbidities and participant's personal goals.       Expected Outcomes Short Term: Increase workloads from initial exercise prescription for resistance, speed, and METs.;Short Term: Perform resistance training exercises routinely during rehab and add in resistance training at home;Long Term: Improve cardiorespiratory fitness, muscular endurance  and strength as measured by increased METs and functional capacity ( )       Able to understand and use rate of perceived exertion (RPE) scale Yes       Intervention Provide education and explanation on how to use RPE scale       Expected Outcomes Short Term: Able to use RPE daily in rehab to express subjective intensity level;Long Term:  Able to use RPE to guide intensity level when exercising independently       Able to understand and use Dyspnea scale Yes       Intervention Provide education and explanation on how to use Dyspnea scale       Expected Outcomes Short Term: Able to use Dyspnea scale daily in rehab to express subjective sense of shortness of breath during exertion;Long Term: Able to use Dyspnea scale to guide intensity level when exercising independently       Knowledge and understanding of Target Heart Rate Range (THRR) Yes       Intervention Provide education and explanation of THRR including how the numbers were  predicted and where they are located for reference       Expected Outcomes Short Term: Able to state/look up THRR;Short Term: Able to use daily as guideline for intensity in rehab;Long Term: Able to use THRR to govern intensity when exercising independently       Understanding of Exercise Prescription Yes       Intervention Provide education, explanation, and written materials on patient's individual exercise prescription       Expected Outcomes Short Term: Able to explain program exercise prescription;Long Term: Able to explain home exercise prescription to exercise independently          Exercise Goals Re-Evaluation :  Exercise Goals Re-Evaluation     Row Name 07/16/24 1551 08/12/24 0910 09/07/24 0933         Exercise Goal Re-Evaluation   Exercise Goals Review Increase Physical Activity;Able to understand and use Dyspnea scale;Understanding of Exercise Prescription;Increase Strength and Stamina;Knowledge and understanding of Target Heart Rate Range (THRR);Able to understand and use rate of perceived exertion (RPE) scale Increase Physical Activity;Able to understand and use Dyspnea scale;Understanding of Exercise Prescription;Increase Strength and Stamina;Knowledge and understanding of Target Heart Rate Range (THRR);Able to understand and use rate of perceived exertion (RPE) scale Increase Physical Activity;Able to understand and use Dyspnea scale;Understanding of Exercise Prescription;Increase Strength and Stamina;Knowledge and understanding of Target Heart Rate Range (THRR);Able to understand and use rate of perceived exertion (RPE) scale     Comments [name] has completed 1 exercise sessions. She exercises for 15 min on Nustep and track. She averages 1.7 METs at level 2 on the Nustep and 1.84 METs on the track. She performed the warmup and cooldown standing without limitaions. [name] shows some deconditioning. Will continue to monitor and progress as able. [name] has completed 6 exercise  sessions. She exercises for 15 min on Nustep and track. She averages 1.9 METs at level 2 on the Nustep and 2.18 METs on the track. She performs the warmup and cooldown standing without limitations. [name] has increased her track laps. She seems very motivated to exercise. Will continue to monitor and progress as able. [name] has completed 10 exercise sessions. She exercises for 15 min on Nustep and track. She averages 2 METs at level 3 on the Nustep and 1.56 METs on the track. She performs the warmup and cooldown standing without limitations. [name] has increased her level on the Nustep as METs  have slightly increased. Her track laps have declined due to increased shortness of breath. [name] has missed several sessions because of her shortness of breath. Will continue to monitor and progress as able.     Expected Outcomes Through exercise at rehab and home, the patient will decrease shortness of breath with daily activities and feel confident in carrying out an exercise regimen at home. Through exercise at rehab and home, the patient will decrease shortness of breath with daily activities and feel confident in carrying out an exercise regimen at home. Through exercise at rehab and home, the patient will decrease shortness of breath with daily activities and feel confident in carrying out an exercise regimen at home.        Discharge Exercise Prescription (Final Exercise Prescription Changes):  Exercise Prescription Changes - 09/08/24 0944       Response to Exercise   Blood Pressure (Admit) 118/72    Blood Pressure (Exit) 120/68    Heart Rate (Admit) 75 bpm    Heart Rate (Exercise) 97 bpm    Heart Rate (Exit) 75 bpm    Oxygen  Saturation (Admit) 96 %    Oxygen  Saturation (Exercise) 91 %    Oxygen  Saturation (Exit) 97 %    Rating of Perceived Exertion (Exercise) 13    Perceived Dyspnea (Exercise) 1    Duration Continue with 30 min of aerobic exercise without signs/symptoms of physical distress.     Intensity THRR unchanged      Progression   Progression Continue to progress workloads to maintain intensity without signs/symptoms of physical distress.      Resistance Training   Training Prescription Yes    Weight red bands    Reps 10-15    Time 10 Minutes      NuStep   Level 3    Minutes 15    METs 1.9      Track   Laps 13    Minutes 15    METs 1.91          Nutrition:  Target Goals: Understanding of nutrition guidelines, daily intake of sodium 1500mg , cholesterol 200mg , calories 30% from fat and 7% or less from saturated fats, daily to have 5 or more servings of fruits and vegetables.  Biometrics:  Pre Biometrics - 07/10/24 1020       Pre Biometrics   Grip Strength 18 kg   new dynomometer          Nutrition Therapy Plan and Nutrition Goals:  Nutrition Therapy & Goals - 07/17/24 1557       Nutrition Therapy   Diet General Healthy Diet    Drug/Food Interactions Statins/Certain Fruits      Personal Nutrition Goals   Nutrition Goal Patient to improve diet quality by using the plate method as a guide for meal planning to include lean protein/plant protein, fruits, vegetables, whole grains, nonfat dairy as part of a well-balanced diet.    Comments Patient has medical history of HTN, peripheral vascular disease, hyperlipidemia, former smoker, prediabetes, cirrhosis, COPD3. LDL is well controlled. A1c remains in a prediabetic range. Liver enzymes are WNL.  She reports following a reguarly diet and reports that she has been working on diet and exercise to aid with weight loss. Patient will benefit from participation in pulmonary rehab for nutrition education, exercise, and lifestyle modification.      Intervention Plan   Intervention Prescribe, educate and counsel regarding individualized specific dietary modifications aiming towards targeted core components such as weight, hypertension,  lipid management, diabetes, heart failure and other comorbidities.;Nutrition  handout(s) given to patient.    Expected Outcomes Short Term Goal: Understand basic principles of dietary content, such as calories, fat, sodium, cholesterol and nutrients.;Long Term Goal: Adherence to prescribed nutrition plan.          Nutrition Assessments:  MEDIFICTS Score Key: >=70 Need to make dietary changes  40-70 Heart Healthy Diet <= 40 Therapeutic Level Cholesterol Diet   Picture Your Plate Scores: <59 Unhealthy dietary pattern with much room for improvement. 41-50 Dietary pattern unlikely to meet recommendations for good health and room for improvement. 51-60 More healthful dietary pattern, with some room for improvement.  >60 Healthy dietary pattern, although there may be some specific behaviors that could be improved.    Nutrition Goals Re-Evaluation:  Nutrition Goals Re-Evaluation     Row Name 07/17/24 1557             Goals   Current Weight 191 lb 9.3 oz (86.9 kg)       Comment hemoglobin 10, A1c 6.0, LDL 80, HDL 77       Expected Outcome Patient has medical history of HTN, peripheral vascular disease, hyperlipidemia, former smoker, prediabetes, cirrhosis, COPD3. LDL is well controlled. A1c remains in a prediabetic range. Liver enzymes are WNL. She reports following a reguarly diet and reports that she has been working on diet and exercise to aid with weight loss. Will continue to monitor weight and discuss strategies for weight loss. Patient will benefit from participation in pulmonary rehab for nutrition education, exercise, and lifestyle modification.          Nutrition Goals Discharge (Final Nutrition Goals Re-Evaluation):  Nutrition Goals Re-Evaluation - 07/17/24 1557       Goals   Current Weight 191 lb 9.3 oz (86.9 kg)    Comment hemoglobin 10, A1c 6.0, LDL 80, HDL 77    Expected Outcome Patient has medical history of HTN, peripheral vascular disease, hyperlipidemia, former smoker, prediabetes, cirrhosis, COPD3. LDL is well controlled. A1c remains in  a prediabetic range. Liver enzymes are WNL. She reports following a reguarly diet and reports that she has been working on diet and exercise to aid with weight loss. Will continue to monitor weight and discuss strategies for weight loss. Patient will benefit from participation in pulmonary rehab for nutrition education, exercise, and lifestyle modification.          Psychosocial: Target Goals: Acknowledge presence or absence of significant depression and/or stress, maximize coping skills, provide positive support system. Participant is able to verbalize types and ability to use techniques and skills needed for reducing stress and depression.  Initial Review & Psychosocial Screening:  Initial Psych Review & Screening - 07/10/24 1030       Initial Review   Current issues with History of Depression;Current Stress Concerns    Source of Stress Concerns Family;Chronic Illness      Family Dynamics   Good Support System? Yes    Concerns Recent loss of significant other      Barriers   Psychosocial barriers to participate in program The patient should benefit from training in stress management and relaxation.;Psychosocial barriers identified (see note)      Screening Interventions   Interventions Encouraged to exercise;Program counselor consult;To provide support and resources with identified psychosocial needs;Provide feedback about the scores to participant    Expected Outcomes Short Term goal: Utilizing psychosocial counselor, staff and physician to assist with identification of specific Stressors or current issues interfering with  healing process. Setting desired goal for each stressor or current issue identified.;Long Term Goal: Stressors or current issues are controlled or eliminated.;Short Term goal: Identification and review with participant of any Quality of Life or Depression concerns found by scoring the questionnaire.;Long Term goal: The participant improves quality of Life and PHQ9  Scores as seen by post scores and/or verbalization of changes          Quality of Life Scores:  Scores of 19 and below usually indicate a poorer quality of life in these areas.  A difference of  2-3 points is a clinically meaningful difference.  A difference of 2-3 points in the total score of the Quality of Life Index has been associated with significant improvement in overall quality of life, self-image, physical symptoms, and general health in studies assessing change in quality of life.  PHQ-9: Review Flowsheet  More data exists      07/10/2024 03/25/2024 03/06/2024 12/25/2023 12/02/2023  Depression screen PHQ 2/9  Decreased Interest 1 0 0 0 0 0  Down, Depressed, Hopeless 1 0 0 0 0 0  PHQ - 2 Score 2 0 0 0 0 0  Altered sleeping 1 - - 0 0  Tired, decreased energy 1 - - - 0  Change in appetite 1 - - 0 0  Feeling bad or failure about yourself  0 - - 0 0  Trouble concentrating 1 - - 0 0  Moving slowly or fidgety/restless 0 - - 0 0  Suicidal thoughts 0 - - 0 0  PHQ-9 Score 6 - - 0 0  Difficult doing work/chores Somewhat difficult - - Not difficult at all Not difficult at all    Details       Multiple values from one day are sorted in reverse-chronological order        Interpretation of Total Score  Total Score Depression Severity:  1-4 = Minimal depression, 5-9 = Mild depression, 10-14 = Moderate depression, 15-19 = Moderately severe depression, 20-27 = Severe depression   Psychosocial Evaluation and Intervention:  Psychosocial Evaluation - 07/10/24 1031       Psychosocial Evaluation & Interventions   Interventions Therapist referral;Encouraged to exercise with the program and follow exercise prescription    Continue Psychosocial Services  Follow up required by counselor          Psychosocial Re-Evaluation:  Psychosocial Re-Evaluation     Row Name 07/17/24 1249 08/12/24 0947 09/07/24 1221         Psychosocial Re-Evaluation   Current issues with History of  Depression;Current Stress Concerns History of Depression;Current Stress Concerns History of Depression;Current Stress Concerns     Comments [name] has attended one session so far. She denies any new psychosocial barriers or concerns at this time. Referral was placed to a mental health specialist. [name] denies any new psychosocial barriers or concerns at this time. She is still dealing with the loss of her significant other. She enjoy's coming to class and has made some friends in class. No needs at this time. Monthly psychosocial re-evaluation as follows: [name] denies any new psychosocial barriers or concerns currently. She is still dealing with the loss of her significant other. She is now seeing a therapist for her mental health. She enjoys coming to class and has made some friends in class for support. [name] denies any other needs or concerns at this time.     Expected Outcomes For Pascuala to have less stress while participating in the program. For Vesper to have  less stress while participating in the program. For [name] to attend pulmonary rehab and to have a positive outlook and good coping skills to manage her health-related stress. To have decreased s/s of depression. To use relaxation and stress management skills taught in class. To ask for resources or referrals if needed.     Interventions Encouraged to attend Pulmonary Rehabilitation for the exercise Encouraged to attend Pulmonary Rehabilitation for the exercise;Stress management education;Relaxation education Encouraged to attend Pulmonary Rehabilitation for the exercise     Continue Psychosocial Services  Follow up required by staff Follow up required by staff Follow up required by staff        Psychosocial Discharge (Final Psychosocial Re-Evaluation):  Psychosocial Re-Evaluation - 09/07/24 1221       Psychosocial Re-Evaluation   Current issues with History of Depression;Current Stress Concerns    Comments Monthly psychosocial  re-evaluation as follows: [name] denies any new psychosocial barriers or concerns currently. She is still dealing with the loss of her significant other. She is now seeing a therapist for her mental health. She enjoys coming to class and has made some friends in class for support. Kynzli denies any other needs or concerns at this time.    Expected Outcomes For [name] to attend pulmonary rehab and to have a positive outlook and good coping skills to manage her health-related stress. To have decreased s/s of depression. To use relaxation and stress management skills taught in class. To ask for resources or referrals if needed.    Interventions Encouraged to attend Pulmonary Rehabilitation for the exercise    Continue Psychosocial Services  Follow up required by staff          Education: Education Goals: Education classes will be provided on a weekly basis, covering required topics. Participant will state understanding/return demonstration of topics presented.  Learning Barriers/Preferences:  Learning Barriers/Preferences - 07/10/24 1032       Learning Barriers/Preferences   Learning Barriers None    Learning Preferences None          Education Topics: Know Your Numbers Group instruction that is supported by a PowerPoint presentation. Instructor discusses importance of knowing and understanding resting, exercise, and post-exercise oxygen  saturation, heart rate, and blood pressure. Oxygen  saturation, heart rate, blood pressure, rating of perceived exertion, and dyspnea are reviewed along with a normal range for these values.    Exercise for the Pulmonary Patient Group instruction that is supported by a PowerPoint presentation. Instructor discusses benefits of exercise, core components of exercise, frequency, duration, and intensity of an exercise routine, importance of utilizing pulse oximetry during exercise, safety while exercising, and options of places to exercise outside of rehab.   Flowsheet Row PULMONARY REHAB CHRONIC OBSTRUCTIVE PULMONARY DISEASE from 08/27/2024 in [facility]  Date 08/27/24  Educator EP  Instruction Review Code 1- Verbalizes Understanding    MET Level  Group instruction provided by PowerPoint, verbal discussion, and written material to support subject matter. Instructor reviews what METs are and how to increase METs.  Flowsheet Row PULMONARY REHAB CHRONIC OBSTRUCTIVE PULMONARY DISEASE from 07/30/2024 in [facility], [facility]  Date 07/30/24  Educator EP  Instruction Review Code 1- Verbalizes Understanding    Pulmonary Medications Verbally interactive group education provided by instructor with focus on inhaled medications and proper administration. Flowsheet Row PULMONARY REHAB CHRONIC OBSTRUCTIVE PULMONARY DISEASE from 08/20/2024 in [facility], [facility]  Date 08/20/24  Educator RT  Instruction Review Code 1- Verbalizes Understanding    Anatomy and Physiology of the Respiratory System Group instruction provided by PowerPoint, verbal discussion, and written material to support subject matter. Instructor reviews respiratory cycle and anatomical components of the respiratory system and their functions. Instructor also reviews differences in obstructive and restrictive respiratory diseases with examples of each.    Oxygen  Safety Group instruction provided by PowerPoint, verbal discussion, and written material to support subject matter. There is an overview of "What is Oxygen " and "Why do we need it".  Instructor also reviews how to create a safe environment for oxygen  use, the importance of using oxygen  as prescribed, and the risks of noncompliance. There is a brief discussion on traveling with oxygen  and resources the patient may utilize.   Oxygen  Use Group instruction provided by PowerPoint,  verbal discussion, and written material to discuss how supplemental oxygen  is prescribed and different types of oxygen  supply systems. Resources for more information are provided.    Breathing Techniques Group instruction that is supported by demonstration and informational handouts. Instructor discusses the benefits of pursed lip and diaphragmatic breathing and detailed demonstration on how to perform both.     Risk Factor Reduction Group instruction that is supported by a PowerPoint presentation. Instructor discusses the definition of a risk factor, different risk factors for pulmonary disease, and how the heart and lungs work together.   Pulmonary Diseases Group instruction provided by PowerPoint, verbal discussion, and written material to support subject matter. Instructor gives an overview of the different type of pulmonary diseases. There is also a discussion on risk factors and symptoms as well as ways to manage the diseases. Flowsheet Row PULMONARY REHAB CHRONIC OBSTRUCTIVE PULMONARY DISEASE from 08/06/2024 in [facility] for [facility], Vascular, & Lung [facility]  Date 08/06/24  Educator RT  Instruction Review Code 1- Verbalizes Understanding    Stress and Energy Conservation Group instruction provided by PowerPoint, verbal discussion, and written material to support subject matter. Instructor gives an overview of stress and the impact it can have on the body. Instructor also reviews ways to reduce stress. There is also a discussion on energy conservation and ways to conserve energy throughout the day.   Warning Signs and Symptoms Group instruction provided by PowerPoint, verbal discussion, and written material to support subject matter. Instructor reviews warning signs and symptoms of stroke, heart attack, cold and flu. Instructor also reviews ways to prevent the spread of infection. Flowsheet Row PULMONARY REHAB CHRONIC OBSTRUCTIVE PULMONARY DISEASE from 07/16/2024  in [facility] for Heart, Vascular, & Lung Health  Date 07/16/24  Educator RN  Instruction Review Code 1- Verbalizes Understanding    Other Education Group or individual verbal, written, or video instructions that support the educational goals of the pulmonary rehab program.    Knowledge Questionnaire Score:  Knowledge Questionnaire Score - 07/10/24 1147       Knowledge Questionnaire Score   Pre Score 11/18          Core Components/Risk Factors/Patient Goals at Admission:  Personal Goals and Risk Factors at Admission - 07/10/24 1032       Core Components/Risk Factors/Patient Goals on Admission    Weight Management Yes;Weight Loss    Intervention Weight Management: Develop a combined nutrition and exercise program designed to reach desired caloric intake, while maintaining appropriate intake of nutrient and fiber, sodium and fats, and appropriate energy expenditure required for the weight goal.;Weight  Management: Provide education and appropriate resources to help participant work on and attain dietary goals.;Weight Management/Obesity: Establish reasonable short term and long term weight goals.;Obesity: Provide education and appropriate resources to help participant work on and attain dietary goals.    Expected Outcomes Short Term: Continue to assess and modify interventions until short term weight is achieved;Long Term: Adherence to nutrition and physical activity/exercise program aimed toward attainment of established weight goal;Weight Loss: Understanding of general recommendations for a balanced deficit meal plan, which promotes 1-2 lb weight loss per week and includes a negative energy balance of [phone] kcal/d;Understanding recommendations for meals to include 15-35% energy as protein, 25-35% energy from fat, 35-60% energy from carbohydrates, less than 200mg  of dietary cholesterol, 20-35 gm of total fiber daily;Understanding of distribution of calorie intake  throughout the day with the consumption of 4-5 meals/snacks    Improve shortness of breath with ADL's Yes    Intervention Provide education, individualized exercise plan and daily activity instruction to help decrease symptoms of SOB with activities of daily living.    Expected Outcomes Short Term: Improve cardiorespiratory fitness to achieve a reduction of symptoms when performing ADLs;Long Term: Be able to perform more ADLs without symptoms or delay the onset of symptoms    Stress Yes    Intervention Offer individual and/or small group education and counseling on adjustment to heart disease, stress management and health-related lifestyle change. Teach and support self-help strategies.;Refer participants experiencing significant psychosocial distress to appropriate mental health specialists for further evaluation and treatment. When possible, include family members and significant others in education/counseling sessions.    Expected Outcomes Short Term: Participant demonstrates changes in health-related behavior, relaxation and other stress management skills, ability to obtain effective social support, and compliance with psychotropic medications if prescribed.;Long Term: Emotional wellbeing is indicated by absence of clinically significant psychosocial distress or social isolation.          Core Components/Risk Factors/Patient Goals Review:   Goals and Risk Factor Review     Row Name 07/17/24 1253 08/12/24 0952 09/07/24 1224         Core Components/Risk Factors/Patient Goals Review   Personal Goals Review Weight Management/Obesity;Improve shortness of breath with ADL's;Develop more efficient breathing techniques such as purse lipped breathing and diaphragmatic breathing and practicing self-pacing with activity.;Stress Weight Management/Obesity;Improve shortness of breath with ADL's;Develop more efficient breathing techniques such as purse lipped breathing and diaphragmatic breathing and  practicing self-pacing with activity.;Stress Weight Management/Obesity;Improve shortness of breath with ADL's;Develop more efficient breathing techniques such as purse lipped breathing and diaphragmatic breathing and practicing self-pacing with activity.;Stress     Review Monthly review of patient's Core Components/Risk Factors/Patient Goals are as follows: [name] has attended one session so far. Goal progressing for losing weight. Goal progressing for improving shortness of breath with ADL's. [name] is currently exercising on RA to maintain sats >88%. Goal progressing for developing more efficient breathing techniques such as purse lipped breathing and diaphragmatic breathing; and practicing self-pacing with activity. Goal progressing to reduce stress. We will continue to monitor [name]'s progress throughout the program. Monthly review of patient's Core Components/Risk Factors/Patient Goals are as follows: Goal progressing for losing weight. [name] has met with the staff dietitian on ways to achieve her weight loss goals. Goal progressing for improving shortness of breath with ADL's. [name] is currently exercising on RA to maintain sats >88%. Goal progressing for developing more efficient breathing techniques such as purse lipped breathing and diaphragmatic breathing; and practicing self-pacing with activity. Goal progressing to  reduce stress. We will continue to monitor Harli's progress throughout the program. Monthly review of patient's Core Components/Risk Factors/Patient Goals are as follows: Goal progressing for losing weight. [name] has met with the staff dietitian and planned for dietary changes. She has yet to implement these changes. Her goal is to incorporate more fruits and veggies and use the plate method. She has maintained her weight since starting the program. Goal progressing for improving shortness of breath with ADL's. [name] is currently exercising on RA to maintain sats >88%. She has  been able to increase her workload and METs on the NuStep and lap count on the track. Goal met for developing more efficient breathing techniques such as purse lipped breathing and diaphragmatic breathing; and practicing self-pacing with activity. [name] can perform purse lipped breathing while short of breath. She demonstrated this while performing the warmup and while exercising. She can initiate PLB on her own. She works on diaphragmatic breathing at home. Goal met on reducing stress. [name] has applied stress reducing tips taught in class and is now seeing a therapist about her decline in health and the loss of her significant other. She declines any additional needs at this time. We will continue to monitor [name]'s progress throughout the program.     Expected Outcomes To improve shortness of breath with ADL's, develop more efficient breathing techniques such as purse lipped breathing and diaphragmatic breathing; and practicing self-pacing with activity, lose weight and reduce stress To improve shortness of breath with ADL's, develop more efficient breathing techniques such as purse lipped breathing and diaphragmatic breathing; and practicing self-pacing with activity, lose weight and reduce stress Pt will show progress toward meeting expected goals and outcomes.        Core Components/Risk Factors/Patient Goals at Discharge (Final Review):   Goals and Risk Factor Review - 09/07/24 1224       Core Components/Risk Factors/Patient Goals Review   Personal Goals Review Weight Management/Obesity;Improve shortness of breath with ADL's;Develop more efficient breathing techniques such as purse lipped breathing and diaphragmatic breathing and practicing self-pacing with activity.;Stress    Review Monthly review of patient's Core Components/Risk Factors/Patient Goals are as follows: Goal progressing for losing weight. [name] has met with the staff dietitian and planned for dietary changes. She has yet to  implement these changes. Her goal is to incorporate more fruits and veggies and use the plate method. She has maintained her weight since starting the program. Goal progressing for improving shortness of breath with ADL's. [name] is currently exercising on RA to maintain sats >88%. She has been able to increase her workload and METs on the NuStep and lap count on the track. Goal met for developing more efficient breathing techniques such as purse lipped breathing and diaphragmatic breathing; and practicing self-pacing with activity. [name] can perform purse lipped breathing while short of breath. She demonstrated this while performing the warmup and while exercising. She can initiate PLB on her own. She works on diaphragmatic breathing at home. Goal met on reducing stress. [name] has applied stress reducing tips taught in class and is now seeing a therapist about her decline in health and the loss of her significant other. She declines any additional needs at this time. We will continue to monitor [name]'s progress throughout the program.    Expected Outcomes Pt will show progress toward meeting expected goals and outcomes.          ITP Comments: Pt is making expected progress toward Pulmonary Rehab goals after completing 11 session(s).  Recommend continued exercise, life style modification, education, and utilization of breathing techniques to increase stamina and strength, while also decreasing shortness of breath with exertion.  Dr. Slater Staff is Medical Director for Pulmonary Rehab at [facility].

## 2024-09-17 ENCOUNTER — Encounter (HOSPITAL_COMMUNITY)
Admission: RE | Admit: 2024-09-17 | Discharge: 2024-09-17 | Disposition: A | Discharge status: Home / Self Care | Source: Ambulatory Visit | Attending: Pulmonary Disease | Admitting: Pulmonary Disease

## 2024-09-17 ENCOUNTER — Other Ambulatory Visit: Payer: Self-pay | Admitting: Allergy & Immunology

## 2024-09-17 DIAGNOSIS — J449 Chronic obstructive pulmonary disease, unspecified: Secondary | ICD-10-CM | POA: Insufficient documentation

## 2024-09-17 NOTE — Progress Notes (Signed)
 Daily Session Note  Patient Details  Name: [name] MRN: [id number] Date of Birth: 28-Dec-1960 Referring Provider:   Conrad Ports Pulmonary Rehab Walk Test from 07/10/2024 in [facility]  Referring Provider Dr. Fairy Danker ([facility])    Encounter Date: 09/17/2024  Check In:  Session Check In - 09/17/24 1040       Check-In   Supervising physician immediately available to respond to emergencies [facility] MD immediately available    Physician(s) Jackee Alberts, NP    Location [facility]    Staff Present [name], MS, ACSM-CEP, Exercise Physiologist;Casey Claudene Candia Levin, RN, BSN;Jetta Walker BS, ACSM-CEP, Exercise Physiologist    Virtual Visit No    Medication changes reported     No    Fall or balance concerns reported    No    Tobacco Cessation No Change    Warm-up and Cool-down Performed as group-led instruction    Resistance Training Performed Yes    VAD Patient? No    PAD/SET Patient? No      Pain Assessment   Currently in Pain? No/denies    Multiple Pain Sites No          Capillary Blood Glucose: No results found. However, due to the size of the patient record, not all encounters were searched. Please check Results Review for a complete set of results.    Social History   Tobacco Use  Smoking Status Former   Current packs/day: 0.00   Average packs/day: 0.5 packs/day for 40.0 years (20.0 ttl pk-yrs)   Types: Cigarettes   Start date: 74   Quit date: 04/2018   Years since quitting: 6.4   Passive exposure: Never  Smokeless Tobacco Never  Tobacco Comments   Don't Smoke    Goals Met:  Exercise tolerated well No report of concerns or symptoms today Strength training completed today  Goals Unmet:  Not Applicable  Comments: Service time is from 1002 to 1140    Dr. Slater Staff is Medical Director for Pulmonary Rehab at [facility]

## 2024-09-19 ENCOUNTER — Ambulatory Visit
Admission: RE | Admit: 2024-09-19 | Discharge: 2024-09-19 | Disposition: A | Discharge status: Home / Self Care | Source: Ambulatory Visit | Attending: Internal Medicine | Admitting: Internal Medicine

## 2024-09-19 DIAGNOSIS — R1900 Intra-abdominal and pelvic swelling, mass and lump, unspecified site: Secondary | ICD-10-CM

## 2024-09-19 DIAGNOSIS — R198 Other specified symptoms and signs involving the digestive system and abdomen: Secondary | ICD-10-CM | POA: Diagnosis not present

## 2024-09-19 MED ORDER — GADOPICLENOL 0.5 MMOL/ML IV SOLN
9.0000 mL | Freq: Once | INTRAVENOUS | Status: AC | PRN
  Administered 2024-09-19: 9 mL via INTRAVENOUS

## 2024-09-21 ENCOUNTER — Ambulatory Visit: Payer: Self-pay | Admitting: Internal Medicine

## 2024-09-22 ENCOUNTER — Encounter (HOSPITAL_COMMUNITY)
Admission: RE | Admit: 2024-09-22 | Discharge: 2024-09-22 | Disposition: A | Discharge status: Home / Self Care | Source: Ambulatory Visit | Attending: Pulmonary Disease | Admitting: Pulmonary Disease

## 2024-09-22 DIAGNOSIS — J449 Chronic obstructive pulmonary disease, unspecified: Secondary | ICD-10-CM

## 2024-09-22 NOTE — Progress Notes (Signed)
 Daily Session Note  Patient Details  Name: Lyndal Alamillo MRN: [id number] Date of Birth: [birth year] Referring Provider:   Conrad Ports Pulmonary Rehab Walk Test from 07/10/2024 in [facility]  Referring Provider Dr. Fairy Danker ([facility])    Encounter Date: 09/22/2024  Check In:  Session Check In - 09/22/24 1127       Check-In   Supervising physician immediately available to respond to emergencies [facility] MD immediately available    Physician(s) Barnie Hila, NP    [facility]    Staff Present [name], MS, ACSM-CEP, Exercise Physiologist;Casey Claudene Candia Levin, RN, Valere Music, RN, BSN    Virtual Visit No    Medication changes reported     No    Fall or balance concerns reported    No    Tobacco Cessation No Change    Warm-up and Cool-down Performed as group-led instruction    Resistance Training Performed Yes    VAD Patient? No    PAD/SET Patient? No      Pain Assessment   Currently in Pain? No/denies    Multiple Pain Sites No          Capillary Blood Glucose: No results found. However, due to the size of the patient record, not all encounters were searched. Please check Results Review for a complete set of results.    Social History   Tobacco Use  Smoking Status Former   Current packs/day: 0.00   Average packs/day: 0.5 packs/day for 40.0 years (20.0 ttl pk-yrs)   Types: Cigarettes   Start date: 69   Quit date: 04/2018   Years since quitting: 6.4   Passive exposure: Never  Smokeless Tobacco Never  Tobacco Comments   Don't Smoke    Goals Met:  Independence with exercise equipment Exercise tolerated well No report of concerns or symptoms today Strength training completed today  Goals Unmet:  Not Applicable  Comments: Service time is from 1003 to 1132    Dr. Slater Staff is Medical Director for Pulmonary Rehab at [facility]

## 2024-09-24 ENCOUNTER — Encounter (HOSPITAL_COMMUNITY)
Admission: RE | Admit: 2024-09-24 | Discharge: 2024-09-24 | Disposition: A | Discharge status: Home / Self Care | Source: Ambulatory Visit | Attending: Pulmonary Disease | Admitting: Pulmonary Disease

## 2024-09-24 VITALS — Wt 188.5 lb

## 2024-09-24 DIAGNOSIS — J449 Chronic obstructive pulmonary disease, unspecified: Secondary | ICD-10-CM

## 2024-09-24 NOTE — Progress Notes (Signed)
 Daily Session Note  Patient Details  Name: [name] MRN: [id number] Date of Birth: 12/15/1961 Referring Provider:   Conrad Ports Pulmonary Rehab Walk Test from 07/10/2024 in [facility], [facility]  Referring Provider Dr. Fairy Danker ([facility])    Encounter Date: 09/24/2024  Check In:  Session Check In - 09/24/24 1040       Check-In   Supervising physician immediately available to respond to emergencies [facility] MD immediately available    Physician(s) Josefa Beauvais, NP    [facility]    Staff Present [name], MS, ACSM-CEP, Exercise Physiologist;Casey Claudene Candia Levin, RN, BSN;Randi Reeve BS, ACSM-CEP, Exercise Physiologist    Virtual Visit No    Medication changes reported     No    Fall or balance concerns reported    No    Tobacco Cessation No Change    Warm-up and Cool-down Performed as group-led instruction    Resistance Training Performed Yes    VAD Patient? No    PAD/SET Patient? No      Pain Assessment   Currently in Pain? No/denies    Multiple Pain Sites No          Capillary Blood Glucose: No results found. However, due to the size of the patient record, not all encounters were searched. Please check Results Review for a complete set of results.    Social History   Tobacco Use  Smoking Status Former   Current packs/day: 0.00   Average packs/day: 0.5 packs/day for 40.0 years (20.0 ttl pk-yrs)   Types: Cigarettes   Start date: 44   Quit date: 04/2018   Years since quitting: 6.4   Passive exposure: Never  Smokeless Tobacco Never  Tobacco Comments   Don't Smoke    Goals Met:  Independence with exercise equipment Exercise tolerated well No report of concerns or symptoms today Strength training completed today  Goals Unmet:  Not Applicable  Comments: Service time is from 1000 to 1140    Dr. Slater Staff is Medical Director for Pulmonary Rehab at  [facility].

## 2024-09-28 ENCOUNTER — Other Ambulatory Visit: Payer: Self-pay | Admitting: Internal Medicine

## 2024-09-28 DIAGNOSIS — Z1231 Encounter for screening mammogram for malignant neoplasm of breast: Secondary | ICD-10-CM

## 2024-09-29 ENCOUNTER — Other Ambulatory Visit: Payer: Self-pay | Admitting: Medical Genetics

## 2024-09-29 ENCOUNTER — Encounter (HOSPITAL_COMMUNITY): Admission: RE | Admit: 2024-09-29 | Source: Ambulatory Visit

## 2024-09-29 NOTE — Telephone Encounter (Signed)
 PCC's, please advise. Thanks  Order place 08/2024

## 2024-09-30 ENCOUNTER — Other Ambulatory Visit: Payer: Self-pay

## 2024-09-30 ENCOUNTER — Ambulatory Visit: Payer: Self-pay | Admitting: Internal Medicine

## 2024-09-30 ENCOUNTER — Observation Stay (HOSPITAL_COMMUNITY)
Admission: EM | Admit: 2024-09-30 | Discharge: 2024-10-01 | Disposition: A | Discharge status: Home / Self Care | Attending: Gastroenterology | Admitting: Gastroenterology

## 2024-09-30 ENCOUNTER — Encounter (HOSPITAL_COMMUNITY): Payer: Self-pay | Admitting: Internal Medicine

## 2024-09-30 ENCOUNTER — Emergency Department (HOSPITAL_COMMUNITY)

## 2024-09-30 ENCOUNTER — Telehealth: Payer: Self-pay

## 2024-09-30 ENCOUNTER — Ambulatory Visit (INDEPENDENT_AMBULATORY_CARE_PROVIDER_SITE_OTHER): Admitting: Internal Medicine

## 2024-09-30 ENCOUNTER — Encounter: Payer: Self-pay | Admitting: Internal Medicine

## 2024-09-30 ENCOUNTER — Ambulatory Visit (INDEPENDENT_AMBULATORY_CARE_PROVIDER_SITE_OTHER)

## 2024-09-30 VITALS — BP 124/68 | HR 84 | Temp 98.6°F | Ht 63.0 in | Wt 188.0 lb

## 2024-09-30 DIAGNOSIS — Z87891 Personal history of nicotine dependence: Secondary | ICD-10-CM | POA: Insufficient documentation

## 2024-09-30 DIAGNOSIS — R0602 Shortness of breath: Secondary | ICD-10-CM | POA: Diagnosis not present

## 2024-09-30 DIAGNOSIS — L299 Pruritus, unspecified: Secondary | ICD-10-CM | POA: Diagnosis not present

## 2024-09-30 DIAGNOSIS — K921 Melena: Secondary | ICD-10-CM | POA: Insufficient documentation

## 2024-09-30 DIAGNOSIS — F32A Depression, unspecified: Secondary | ICD-10-CM | POA: Insufficient documentation

## 2024-09-30 DIAGNOSIS — J4489 Other specified chronic obstructive pulmonary disease: Secondary | ICD-10-CM

## 2024-09-30 DIAGNOSIS — J4550 Severe persistent asthma, uncomplicated: Secondary | ICD-10-CM | POA: Diagnosis not present

## 2024-09-30 DIAGNOSIS — F3131 Bipolar disorder, current episode depressed, mild: Secondary | ICD-10-CM | POA: Diagnosis not present

## 2024-09-30 DIAGNOSIS — I2510 Atherosclerotic heart disease of native coronary artery without angina pectoris: Secondary | ICD-10-CM | POA: Insufficient documentation

## 2024-09-30 DIAGNOSIS — R079 Chest pain, unspecified: Secondary | ICD-10-CM | POA: Diagnosis not present

## 2024-09-30 DIAGNOSIS — Z79899 Other long term (current) drug therapy: Secondary | ICD-10-CM | POA: Diagnosis not present

## 2024-09-30 DIAGNOSIS — G4089 Other seizures: Secondary | ICD-10-CM | POA: Diagnosis not present

## 2024-09-30 DIAGNOSIS — M722 Plantar fascial fibromatosis: Secondary | ICD-10-CM

## 2024-09-30 DIAGNOSIS — F319 Bipolar disorder, unspecified: Secondary | ICD-10-CM

## 2024-09-30 DIAGNOSIS — D649 Anemia, unspecified: Secondary | ICD-10-CM | POA: Diagnosis not present

## 2024-09-30 DIAGNOSIS — R42 Dizziness and giddiness: Secondary | ICD-10-CM | POA: Diagnosis not present

## 2024-09-30 DIAGNOSIS — E785 Hyperlipidemia, unspecified: Secondary | ICD-10-CM | POA: Diagnosis not present

## 2024-09-30 DIAGNOSIS — I10 Essential (primary) hypertension: Secondary | ICD-10-CM | POA: Insufficient documentation

## 2024-09-30 DIAGNOSIS — M810 Age-related osteoporosis without current pathological fracture: Secondary | ICD-10-CM | POA: Diagnosis not present

## 2024-09-30 DIAGNOSIS — J441 Chronic obstructive pulmonary disease with (acute) exacerbation: Secondary | ICD-10-CM | POA: Diagnosis present

## 2024-09-30 DIAGNOSIS — F419 Anxiety disorder, unspecified: Secondary | ICD-10-CM | POA: Insufficient documentation

## 2024-09-30 DIAGNOSIS — D509 Iron deficiency anemia, unspecified: Secondary | ICD-10-CM

## 2024-09-30 DIAGNOSIS — D62 Acute posthemorrhagic anemia: Principal | ICD-10-CM | POA: Insufficient documentation

## 2024-09-30 DIAGNOSIS — D500 Iron deficiency anemia secondary to blood loss (chronic): Secondary | ICD-10-CM | POA: Diagnosis present

## 2024-09-30 DIAGNOSIS — K922 Gastrointestinal hemorrhage, unspecified: Secondary | ICD-10-CM | POA: Diagnosis not present

## 2024-09-30 LAB — CBC
HCT: 22.8 % — ABNORMAL LOW (ref 36.0–46.0)
Hemoglobin: 6.7 g/dL — CL (ref 12.0–15.0)
MCH: 22.4 pg — ABNORMAL LOW (ref 26.0–34.0)
MCHC: 29.4 g/dL — ABNORMAL LOW (ref 30.0–36.0)
MCV: 76.3 fL — ABNORMAL LOW (ref 80.0–100.0)
Platelets: 467 K/uL — ABNORMAL HIGH (ref 150–400)
RBC: 2.99 MIL/uL — ABNORMAL LOW (ref 3.87–5.11)
RDW: 18 % — ABNORMAL HIGH (ref 11.5–15.5)
WBC: 13.2 K/uL — ABNORMAL HIGH (ref 4.0–10.5)
nRBC: 0 % (ref 0.0–0.2)

## 2024-09-30 LAB — IBC PANEL
Iron: 17 ug/dL — ABNORMAL LOW (ref 42–145)
Saturation Ratios: 4.2 % — ABNORMAL LOW (ref 20.0–50.0)
TIBC: 401.8 ug/dL (ref 250.0–450.0)
Transferrin: 287 mg/dL (ref 212.0–360.0)

## 2024-09-30 LAB — I-STAT CHEM 8, ED
BUN: 12 mg/dL (ref 8–23)
Calcium, Ion: 1.1 mmol/L — ABNORMAL LOW (ref 1.15–1.40)
Chloride: 104 mmol/L (ref 98–111)
Creatinine, Ser: 0.9 mg/dL (ref 0.44–1.00)
Glucose, Bld: 103 mg/dL — ABNORMAL HIGH (ref 70–99)
HCT: 23 % — ABNORMAL LOW (ref 36.0–46.0)
Hemoglobin: 7.8 g/dL — ABNORMAL LOW (ref 12.0–15.0)
Potassium: 3.7 mmol/L (ref 3.5–5.1)
Sodium: 142 mmol/L (ref 135–145)
TCO2: 25 mmol/L (ref 22–32)

## 2024-09-30 LAB — CBC WITH DIFFERENTIAL/PLATELET
Basophils Absolute: 0 K/uL (ref 0.0–0.1)
Basophils Relative: 0.3 % (ref 0.0–3.0)
Eosinophils Absolute: 0 K/uL (ref 0.0–0.7)
Eosinophils Relative: 0 % (ref 0.0–5.0)
HCT: 21.5 % — CL (ref 36.0–46.0)
Hemoglobin: 6.6 g/dL — CL (ref 12.0–15.0)
Lymphocytes Relative: 20.8 % (ref 12.0–46.0)
Lymphs Abs: 2.1 K/uL (ref 0.7–4.0)
MCHC: 30.7 g/dL (ref 30.0–36.0)
MCV: 71.4 fl — ABNORMAL LOW (ref 78.0–100.0)
Monocytes Absolute: 0.7 K/uL (ref 0.1–1.0)
Monocytes Relative: 6.7 % (ref 3.0–12.0)
Neutro Abs: 7.4 K/uL (ref 1.4–7.7)
Neutrophils Relative %: 72.2 % (ref 43.0–77.0)
Platelets: 482 K/uL — ABNORMAL HIGH (ref 150.0–400.0)
RBC: 3 Mil/uL — ABNORMAL LOW (ref 3.87–5.11)
RDW: 19.3 % — ABNORMAL HIGH (ref 11.5–15.5)
WBC: 10.3 K/uL (ref 4.0–10.5)

## 2024-09-30 LAB — BASIC METABOLIC PANEL WITH GFR
BUN: 12 mg/dL (ref 6–23)
CO2: 30 meq/L (ref 19–32)
Calcium: 9 mg/dL (ref 8.4–10.5)
Chloride: 104 meq/L (ref 96–112)
Creatinine, Ser: 0.85 mg/dL (ref 0.40–1.20)
GFR: 73.76 mL/min (ref >60.00)
Glucose, Bld: 116 mg/dL — ABNORMAL HIGH (ref 70–99)
Potassium: 3.9 meq/L (ref 3.5–5.1)
Sodium: 142 meq/L (ref 135–145)

## 2024-09-30 LAB — HEPATIC FUNCTION PANEL
ALT: 22 U/L (ref 0–35)
AST: 21 U/L (ref 0–37)
Albumin: 4.1 g/dL (ref 3.5–5.2)
Alkaline Phosphatase: 104 U/L (ref 39–117)
Bilirubin, Direct: 0.1 mg/dL (ref 0.0–0.3)
Total Bilirubin: 0.2 mg/dL (ref 0.2–1.2)
Total Protein: 6.7 g/dL (ref 6.0–8.3)

## 2024-09-30 LAB — PREPARE RBC (CROSSMATCH)

## 2024-09-30 LAB — FERRITIN: Ferritin: 7.7 ng/mL — ABNORMAL LOW (ref 10.0–291.0)

## 2024-09-30 LAB — POC OCCULT BLOOD, ED: Fecal Occult Bld: POSITIVE — AB

## 2024-09-30 MED ORDER — CARBAMAZEPINE 200 MG PO TABS: 400.0000 mg | ORAL_TABLET | ORAL | Status: DC

## 2024-09-30 MED ORDER — BENRALIZUMAB 10 MG/0.5ML ~~LOC~~ SOSY
10.0000 mg | PREFILLED_SYRINGE | Freq: Once | SUBCUTANEOUS | Status: AC
  Administered 2024-09-30: 10 mg via SUBCUTANEOUS

## 2024-09-30 MED ORDER — ACETAMINOPHEN 325 MG PO TABS: 650.0000 mg | ORAL_TABLET | Freq: Four times a day (QID) | ORAL | Status: DC | PRN

## 2024-09-30 MED ORDER — DOXEPIN HCL 25 MG PO CAPS: 25.0000 mg | ORAL_CAPSULE | Freq: Every evening | ORAL | Status: DC | PRN

## 2024-09-30 MED ORDER — SODIUM CHLORIDE (PF) 0.9 % IJ SOLN
INTRAMUSCULAR | Status: AC
  Administered 2024-09-30: 10 mL
  Filled 2024-09-30: qty 10

## 2024-09-30 MED ORDER — ALBUTEROL SULFATE (2.5 MG/3ML) 0.083% IN NEBU: 2.5000 mg | INHALATION_SOLUTION | RESPIRATORY_TRACT | Status: DC | PRN

## 2024-09-30 MED ORDER — MONTELUKAST SODIUM 10 MG PO TABS
10.0000 mg | ORAL_TABLET | Freq: Every day | ORAL | Status: DC
  Administered 2024-09-30: 10 mg via ORAL
  Filled 2024-09-30: qty 1

## 2024-09-30 MED ORDER — ARIPIPRAZOLE 10 MG PO TABS: 10.0000 mg | ORAL_TABLET | Freq: Every day | ORAL | Status: DC

## 2024-09-30 MED ORDER — SODIUM CHLORIDE 0.9% IV SOLUTION: Freq: Once | INTRAVENOUS | Status: DC

## 2024-09-30 MED ORDER — BUDESONIDE 0.5 MG/2ML IN SUSP
0.5000 mg | Freq: Two times a day (BID) | RESPIRATORY_TRACT | Status: DC
  Filled 2024-09-30: qty 2

## 2024-09-30 MED ORDER — PANTOPRAZOLE SODIUM 40 MG IV SOLR
40.0000 mg | Freq: Two times a day (BID) | INTRAVENOUS | Status: DC
  Administered 2024-09-30 – 2024-10-01 (×2): 40 mg via INTRAVENOUS
  Filled 2024-09-30 (×2): qty 10

## 2024-09-30 MED ORDER — BUDESON-GLYCOPYRROL-FORMOTEROL 160-9-4.8 MCG/ACT IN AERO
2.0000 | INHALATION_SPRAY | Freq: Two times a day (BID) | RESPIRATORY_TRACT | Status: DC
  Administered 2024-10-01: 2 via RESPIRATORY_TRACT
  Filled 2024-09-30: qty 5.9

## 2024-09-30 MED ORDER — ATORVASTATIN CALCIUM 80 MG PO TABS
80.0000 mg | ORAL_TABLET | Freq: Every day | ORAL | Status: DC
  Filled 2024-09-30: qty 2

## 2024-09-30 MED ORDER — ACETAMINOPHEN 650 MG RE SUPP: 650.0000 mg | Freq: Four times a day (QID) | RECTAL | Status: DC | PRN

## 2024-09-30 MED ORDER — PREDNISONE 10 MG PO TABS: ORAL_TABLET | ORAL | 0 refills | Status: AC

## 2024-09-30 NOTE — Assessment & Plan Note (Signed)
 Mild to mod, for podiatry referral , soft soled shoes

## 2024-09-30 NOTE — Progress Notes (Signed)
 Patient ID: [name], female   DOB: 06-24-1961, 63 y.o.   MRN: [id number]        Chief Complaint: follow up diffuse pruritus without rash or swelling, iron  def anemia, bilateral plantar fasciitis, bipolar illness       HPI:  [name] is a 63 y.o. female here with c/o diffuse pruritus x 3 days without rash or swelling.  No prior hx, but has hx of severe asthma copd overlap. Itching seems to cause lack of energy and decreased sleep.  No dark urine or hx of liver dz.   Pt denies chest pain, increased sob or doe, wheezing, orthopnea, PND, increased LE swelling, palpitations, dizziness or syncope.  Not taking iron  currently, pt asking for f/u cbc iron  today.  No overt bleeding.  Pt also with bilateral heel pain, mod to severe, worse to walk first in the am, better later in the day, asking for podiatry and has not been recently.   Also has hx of bipolar not currently seeing psychiatry as her MD moved away, and asks for referral and counseling referral as well.  Has mild on going depressive symptoms, but no suicidal ideation, or panic; has ongoing anxiety, not increased recently.        Wt Readings from Last 3 Encounters:  09/30/24 188 lb (85.3 kg)  09/24/24 188 lb 7.9 oz (85.5 kg)  09/08/24 191 lb 9.3 oz (86.9 kg)   BP Readings from Last 3 Encounters:  09/30/24 (!) 100/50  09/30/24 124/68  08/26/24 112/80         Past Medical History:  Diagnosis Date   Allergy     Anemia, iron  deficiency 12/22/2014   pt. 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      Asthma    Bipolar disorder (HCC)    with anxiety, depression   COPD (chronic obstructive pulmonary disease) (HCC)    Depression    Eczema 05/18/2022   Emphysema of lung (HCC)    Fibromyalgia 05/14/2014   GERD  (gastroesophageal reflux disease)    History of kidney stones    Hyperlipidemia 04/20/2017   HYPERTENSION 05/12/2009   Qualifier: Diagnosis of  By: Primus LATHER LEODIS), [name]     Osteoporosis    Peripheral vascular disease    Pneumonia    Prediabetes 02/23/2014   pt. denies   Seizure (HCC)    Sleep apnea    Stroke (HCC) 11/2020   Urticaria    Vitamin D  deficiency    Past Surgical History:  Procedure Laterality Date   ABDOMINAL HYSTERECTOMY     ANTERIOR CERVICAL DECOMP/DISCECTOMY FUSION N/A 07/28/2020   Procedure: ANTERIOR CERVICAL DECOMPRESSION/DISCECTOMY FUSION. INTERBODY PROTHESIS, PLATE/SCREWS CERVICAL THREE- CERVICAL FOUR, CERVICAL FOUR- CERVICAL FIVE;  Surgeon: Mavis Purchase, MD;  Location: [facility] OR;  Service: Neurosurgery;  Laterality: N/A;   BACK SURGERY     COLONOSCOPY  12/20/2011   Procedure: COLONOSCOPY;  Surgeon: Elsie CHRISTELLA Cree, MD;  Location: [facility];  Service: Endoscopy;  Laterality: N/A;   COLONOSCOPY  03/05/2012   Procedure: COLONOSCOPY;  Surgeon: Elsie CHRISTELLA Cree, MD;  Location: [facility];  Service: Endoscopy;  Laterality: N/A;   DIAGNOSTIC LAPAROSCOPY     ESOPHAGOGASTRODUODENOSCOPY (EGD) WITH PROPOFOL  N/A 10/31/2023   Procedure: ESOPHAGOGASTRODUODENOSCOPY (EGD) WITH PROPOFOL ;  Surgeon: Legrand Victory CROME  III, MD;  Location: [facility];  Service: Gastroenterology;  Laterality: N/A;   HEMORRHOID SURGERY     INCISE AND DRAIN ABCESS     KIDNEY STONE SURGERY     NECK SURGERY     x 2 Dr JINNY Budge   SPINE SURGERY  2019   TOE SURGERY     TUBAL LIGATION      reports that she quit smoking about 6 years ago. Her smoking use included cigarettes. She started smoking about 46 years ago. She has a 20 pack-year smoking history. She has never been exposed to tobacco smoke. She has never used smokeless tobacco. She reports that she does not currently use alcohol . She reports that she does not currently use drugs after having used the following drugs: Marijuana. family  history includes Alcohol  abuse in her brother and father; Anemia in her daughter, sister, and sister; Anesthesia problems in her father; Asthma in her brother, brother, mother, and sister; Brain cancer in her sister; Breast cancer (age of onset: 47) in her sister; COPD in her brother, mother, and sister; Colon cancer (age of onset: 51) in her father; Depression in her brother; Diabetes in her brother, father, mother, sister, sister, sister, and sister; Heart disease in her brother, brother, mother, and sister; Hypertension in her brother, brother, daughter, sister, sister, sister, sister, and sister; Kidney disease in her father; Sleep apnea in her brother. Allergies  Allergen Reactions   Sulfa Antibiotics Nausea And Vomiting and Other (See Comments)   Venofer  [Iron  Sucrose] Hives   Current Facility-Administered Medications on File Prior to Visit  Medication Dose Route Frequency Provider Last Rate Last Admin   benralizumab  (FASENRA ) prefilled syringe 30 mg  30 mg Subcutaneous Q8 Weeks Gallagher, Joel Louis, MD   30 mg at 09/02/24 9063   Current Outpatient Medications on File Prior to Visit  Medication Sig Dispense Refill   AIRSUPRA  90-80 MCG/ACT AERO SMARTSIG:2 Puff(s) Via Inhaler 4 Times Daily PRN     albuterol  (PROVENTIL ) (2.5 MG/3ML) 0.083% nebulizer solution USE 1 VIAL VIA NEBULIZER EVERY 4 HOURS AS NEEDED FOR WHEEZING OR SHORTNESS OF BREATH 75 mL 1   albuterol  (VENTOLIN  HFA) 108 (90 Base) MCG/ACT inhaler INHALE 2 PUFFS INTO THE LUNGS EVERY 6 HOURS AS NEEDED FOR WHEEZING OR SHORTNESS OF BREATH 18 g 3   amLODipine  (NORVASC ) 5 MG tablet Take 1 tablet (5 mg total) by mouth daily. 90 tablet 3   ARIPiprazole  (ABILIFY ) 10 MG tablet Take 1 tablet (10 mg total) by mouth daily. Take Abilify  20 mg po daily for 2 weeks till 9/17 , then continue taking 10 mg po daily 30 tablet 1   Aspirin -Salicylamide-Caffeine  (BC HEADACHE POWDER PO) Take 1 packet by mouth daily.     atorvastatin  (LIPITOR) 80 MG tablet  Take 1 tablet (80 mg total) by mouth daily. 90 tablet 1   azelastine  (OPTIVAR ) 0.05 % ophthalmic solution Place 1 drop into both eyes 2 (two) times daily. 6 mL 12   benralizumab  (FASENRA  PEN) 30 MG/ML prefilled autoinjector Inject 1 mL (30 mg total) into the skin every 28 (twenty-eight) days. For 3 doses then every 8 weeks 1 mL 9   BREZTRI  AEROSPHERE 160-9-4.8 MCG/ACT AERO inhaler INHALE 2 PUFFS INTO THE LUNGS IN THE MORNING AND AT BEDTIME 10.7 g 11   budesonide  (PULMICORT ) 0.5 MG/2ML nebulizer solution USE 2 ML(0.5 MG) VIA NEBULIZER IN THE MORNING AND AT BEDTIME 120 mL 2   carbamazepine  (TEGRETOL ) 200 MG tablet Take 2-3 tablets (400-600 mg total)  by mouth See admin instructions. Take 400mg  in the morning and 600mg  at bedtime. 150 tablet 2   cromolyn  (OPTICROM ) 4 % ophthalmic solution Place 1 drop into both eyes 4 (four) times daily. 10 mL 12   doxepin  (SINEQUAN ) 25 MG capsule TAKE 1 CAPSULE(25 MG) BY MOUTH AT BEDTIME AS NEEDED 30 capsule 5   Ensifentrine  (OHTUVAYRE ) 3 MG/2.5ML SUSP Inhale 3 mg into the lungs in the morning and at bedtime. 150 mL 11   estradiol  (ESTRACE ) 0.1 MG/GM vaginal cream Place 1g nightly 2-3 times a week 30 g 3   famotidine  (PEPCID ) 40 MG tablet Take 1 tablet (40 mg total) by mouth 2 (two) times daily. 60 tablet 5   ferrous sulfate  325 (65 FE) MG EC tablet TAKE 1 TABLET(325 MG) BY MOUTH DAILY WITH BREAKFAST 90 tablet 1   fluticasone  (FLONASE ) 50 MCG/ACT nasal spray SHAKE LIQUID AND USE 1 SPRAY IN EACH NOSTRIL TWICE DAILY AS NEEDED FOR ALLERGIES OR RHINITIS 16 g 5   formoterol  (PERFOROMIST ) 20 MCG/2ML nebulizer solution Take 20 mcg by nebulization 2 (two) times daily.     furosemide  (LASIX ) 20 MG tablet TAKE 1 TABLET BY MOUTH DAILY AS NEEDED 30 tablet 5   hydrOXYzine  (VISTARIL ) 25 MG capsule Take one tablet every 6-8 hours as needed for itching. 30 capsule 1   ipratropium (ATROVENT  HFA) 17 MCG/ACT inhaler Inhale 2 puffs into the lungs every 4 (four) hours as needed for  wheezing. 1 each 5   irbesartan  (AVAPRO ) 150 MG tablet Take 1 tablet (150 mg total) by mouth daily. 90 tablet 3   levocetirizine (XYZAL ) 5 MG tablet Take 1 tablet (5 mg total) by mouth in the morning and at bedtime. 60 tablet 5   montelukast  (SINGULAIR ) 10 MG tablet TAKE 1 TABLET(10 MG) BY MOUTH AT BEDTIME 90 tablet 1   Nebulizers (PARI PRONEB MAX LC SPRINT) MISC      omeprazole  (PRILOSEC) 40 MG capsule Take 1 capsule (40 mg total) by mouth daily. 90 capsule 3   ondansetron  (ZOFRAN -ODT) 4 MG disintegrating tablet DISSOLVE 1 TABLET(4 MG) ON THE TONGUE EVERY 8 HOURS AS NEEDED FOR NAUSEA OR VOMITING 30 tablet 1   Skin Protectants, Misc. (EUCERIN) cream Apply 1 Application topically daily as needed for dry skin.     tacrolimus  (PROTOPIC ) 0.03 % ointment Apply 1 Application topically 2 (two) times daily as needed (rash). (Patient taking differently: Apply 1 Application topically 2 (two) times daily as needed (recurrent rash).) 60 g 5   triamcinolone  ointment (KENALOG ) 0.1 % Apply 1 Application topically at bedtime. 30 g 5   Vibegron  (GEMTESA ) 75 MG TABS Take 1 tablet (75 mg total) by mouth daily. 30 tablet 11        ROS:  All others reviewed and negative.  Objective        PE:  BP 124/68 (BP Location: Right Arm, Patient Position: Sitting, Cuff Size: Normal)   Pulse 84   Temp 98.6 F (37 C) (Oral)   Ht 5' 3 (1.6 m)   Wt 188 lb (85.3 kg)   SpO2 97%   BMI 33.30 kg/m                 Constitutional: Pt appears in NAD, fatigued               HENT: Head: NCAT.                Right Ear: External ear normal.  Left Ear: External ear normal.                Eyes: . Pupils are equal, round, and reactive to light. Conjunctivae and EOM are normal               Nose: without d/c or deformity               Neck: Neck supple. Gross normal ROM               Cardiovascular: Normal rate and regular rhythm.                 Pulmonary/Chest: Effort normal and breath sounds without rales or  wheezing.                Abd:  Soft, NT, ND, + BS, no organomegaly               Neurological: Pt is alert. At baseline orientation, motor grossly intact               Skin: Skin is warm. No rashes, no other new lesions, LE edema - none               Psychiatric: Pt behavior is normal without agitation   Micro: none  Cardiac tracings I have personally interpreted today:  none  Pertinent Radiological findings (summarize): none   Lab Results  Component Value Date   WBC 13.2 (H) 09/30/2024   HGB 7.8 (L) 09/30/2024   HCT 23.0 (L) 09/30/2024   PLT 467 (H) 09/30/2024   GLUCOSE 103 (H) 09/30/2024   CHOL 171 12/25/2023   TRIG 64.0 12/25/2023   HDL 77.50 12/25/2023   LDLCALC 80 12/25/2023   ALT 22 09/30/2024   AST 21 09/30/2024   NA 142 09/30/2024   K 3.7 09/30/2024   CL 104 09/30/2024   CREATININE 0.90 09/30/2024   BUN 12 09/30/2024   CO2 30 09/30/2024   TSH 0.53 12/25/2023   INR 0.9 03/14/2023   HGBA1C 6.0 12/25/2023   Assessment/Plan:  [name] is a 63 y.o. Black or African American [2] female with  has a past medical history of Allergy , Anemia, iron  deficiency (12/22/2014), Anxiety, Arthritis, ASTHMA (05/12/2009), Asthma, Bipolar disorder (HCC), COPD (chronic obstructive pulmonary disease) (HCC), Depression, Eczema (05/18/2022), Emphysema of lung (HCC), Fibromyalgia (05/14/2014), GERD (gastroesophageal reflux disease), History of kidney stones, Hyperlipidemia (04/20/2017), HYPERTENSION (05/12/2009), Osteoporosis, Peripheral vascular disease, Pneumonia, Prediabetes (02/23/2014), Seizure (HCC), Sleep apnea, Stroke (HCC) (11/2020), Urticaria, and Vitamin D  deficiency.  Bipolar disorder (HCC) Mild persistent symptoms for several wks, asks for referrals to psychiatry and counseling  COPD/asthma Stable today, exam benign  Asthma-COPD overlap syndrome (HCC) Currently stable, pt to continue current inhaler  Chronic iron  deficiency anemia No recent overt bleeding,  not taking oral iron  currently, with fatigue today, for f/u cbc iron  lab  Itching No rash or sweling, for trial prednisone  taper but also for LFTs and UA  Bilateral plantar fasciitis Mild to mod, for podiatry referral , soft soled shoes  Followup: Return in about 6 months (around 03/31/2025).  Lynwood Rush, MD 09/30/2024 8:52 PM [facility]

## 2024-09-30 NOTE — Assessment & Plan Note (Signed)
 Mild persistent symptoms for several wks, asks for referrals to psychiatry and counseling

## 2024-09-30 NOTE — Assessment & Plan Note (Signed)
 Currently stable, pt to continue current inhaler

## 2024-09-30 NOTE — ED Notes (Signed)
Blood consent signed by patient and witnessed by this RN

## 2024-09-30 NOTE — Telephone Encounter (Signed)
 Called patient and advised her to go to the ED now pt verbalized she understood

## 2024-09-30 NOTE — ED Provider Notes (Signed)
 [place] EMERGENCY DEPARTMENT AT [facility] Provider Note   CSN: [id number] Arrival date & time: 09/30/24  1729     Patient presents with: Low Hgb, Chest Pain, Shortness of Breath, and Dizziness   [name] is a 63 y.o. female.   This is a 63 year old female presenting emergency departme for low hemoglobin.  She has been fatigued for the past month some dyspnea on exertion.  Had outpatient labs that showed a hemoglobin 6.7.  Not on blood thinner.  No overt blood loss.  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  No abdominal pain.     Chest Pain Associated symptoms: dizziness and shortness of breath   Shortness of Breath Associated symptoms: chest pain   Dizziness Associated symptoms: chest pain and shortness of breath        Prior to Admission medications   Medication Sig Start Date End Date Taking? Authorizing Provider  AIRSUPRA  90-80 MCG/ACT AERO SMARTSIG:2 Puff(s) Via Inhaler 4 Times Daily PRN 05/10/24   [provider]  albuterol  (PROVENTIL ) (2.5 MG/3ML) 0.083% nebulizer solution USE 1 VIAL VIA NEBULIZER EVERY 4 HOURS AS NEEDED FOR WHEEZING OR SHORTNESS OF BREATH 03/26/24   Iva Marty Saltness, MD  albuterol  (VENTOLIN  HFA) 108 [phone] Base) MCG/ACT inhaler INHALE 2 PUFFS INTO THE LUNGS EVERY 6 HOURS AS NEEDED FOR WHEEZING OR SHORTNESS OF BREATH 09/17/24   Cheryl Reusing, FNP  amLODipine  (NORVASC ) 5 MG tablet Take 1 tablet (5 mg total) by mouth daily. 04/21/24   Patwardhan, Newman PARAS, MD  ARIPiprazole  (ABILIFY ) 10 MG tablet Take 1 tablet (10 mg total) by mouth daily. Take Abilify  20 mg po daily for 2 weeks till 9/17 , then continue taking 10 mg po daily 08/20/23   Drusilla Sabas RAMAN, MD  Aspirin -Salicylamide-Caffeine  (BC HEADACHE POWDER PO) Take 1 packet by mouth daily.    [provider]  atorvastatin  (LIPITOR) 80 MG tablet Take 1 tablet (80 mg total) by mouth daily. 07/22/24   Patwardhan, Newman PARAS, MD   azelastine  (OPTIVAR ) 0.05 % ophthalmic solution Place 1 drop into both eyes 2 (two) times daily. 05/18/22   Norleen Lynwood ORN, MD  benralizumab  (FASENRA  PEN) 30 MG/ML prefilled autoinjector Inject 1 mL (30 mg total) into the skin every 28 (twenty-eight) days. For 3 doses then every 8 weeks 10/29/23   Iva Marty Saltness, MD  BREZTRI  AEROSPHERE 160-9-4.8 MCG/ACT AERO inhaler INHALE 2 PUFFS INTO THE LUNGS IN THE MORNING AND AT BEDTIME 08/10/24   Shelah Lamar RAMAN, MD  budesonide  (PULMICORT ) 0.5 MG/2ML nebulizer solution USE 2 ML(0.5 MG) VIA NEBULIZER IN THE MORNING AND AT BEDTIME 09/11/24   Iva Marty Saltness, MD  carbamazepine  (TEGRETOL ) 200 MG tablet Take 2-3 tablets (400-600 mg total) by mouth See admin instructions. Take 400mg  in the morning and 600mg  at bedtime. 02/06/23   Plovsky, Elna, MD  cromolyn  (OPTICROM ) 4 % ophthalmic solution Place 1 drop into both eyes 4 (four) times daily. 11/19/23   Iva Marty Saltness, MD  doxepin  (SINEQUAN ) 25 MG capsule TAKE 1 CAPSULE(25 MG) BY MOUTH AT BEDTIME AS NEEDED 05/25/24   Iva Marty Saltness, MD  Ensifentrine  (OHTUVAYRE ) 3 MG/2.5ML SUSP Inhale 3 mg into the lungs in the morning and at bedtime. 01/03/24   Iva Marty Saltness, MD  estradiol  (ESTRACE ) 0.1 MG/GM vaginal cream Place 1g nightly 2-3 times a week 03/13/24   Guadlupe Dull T, MD  famotidine  (PEPCID ) 40 MG tablet Take 1 tablet (40 mg total)  by mouth 2 (two) times daily. 10/24/23   Iva Marty Saltness, MD  ferrous sulfate  325 (65 FE) MG EC tablet TAKE 1 TABLET(325 MG) BY MOUTH DAILY WITH BREAKFAST 06/08/24   Norleen Lynwood ORN, MD  fluticasone  (FLONASE ) 50 MCG/ACT nasal spray SHAKE LIQUID AND USE 1 SPRAY IN EACH NOSTRIL TWICE DAILY AS NEEDED FOR ALLERGIES OR RHINITIS 01/24/24   Iva Marty Saltness, MD  formoterol  (PERFOROMIST ) 20 MCG/2ML nebulizer solution Take 20 mcg by nebulization 2 (two) times daily. 07/23/23   [provider]  furosemide  (LASIX ) 20 MG tablet TAKE 1 TABLET BY MOUTH DAILY AS NEEDED  12/30/23   Norleen Lynwood ORN, MD  hydrOXYzine  (VISTARIL ) 25 MG capsule Take one tablet every 6-8 hours as needed for itching. 02/05/24   Iva Marty Saltness, MD  ipratropium (ATROVENT  HFA) 17 MCG/ACT inhaler Inhale 2 puffs into the lungs every 4 (four) hours as needed for wheezing. 02/20/24   Iva Marty Saltness, MD  irbesartan  (AVAPRO ) 150 MG tablet Take 1 tablet (150 mg total) by mouth daily. 03/25/24   Norleen Lynwood ORN, MD  levocetirizine (XYZAL ) 5 MG tablet Take 1 tablet (5 mg total) by mouth in the morning and at bedtime. 09/09/24   Cheryl Reusing, FNP  montelukast  (SINGULAIR ) 10 MG tablet TAKE 1 TABLET(10 MG) BY MOUTH AT BEDTIME 08/12/24   Iva Marty Saltness, MD  Nebulizers (PARI PRONEB MAX LC SPRINT) MISC  01/09/24   [provider]  omeprazole  (PRILOSEC) 40 MG capsule Take 1 capsule (40 mg total) by mouth daily. 03/06/24   Norleen Lynwood ORN, MD  ondansetron  (ZOFRAN -ODT) 4 MG disintegrating tablet DISSOLVE 1 TABLET(4 MG) ON THE TONGUE EVERY 8 HOURS AS NEEDED FOR NAUSEA OR VOMITING 07/20/24   Norleen Lynwood ORN, MD  predniSONE  (DELTASONE ) 10 MG tablet 3 tabs by mouth per day for 3 days,2tabs per day for 3 days,1tab per day for 3 days 09/30/24   Norleen Lynwood ORN, MD  Skin Protectants, Misc. (EUCERIN) cream Apply 1 Application topically daily as needed for dry skin.    [provider]  tacrolimus  (PROTOPIC ) 0.03 % ointment Apply 1 Application topically 2 (two) times daily as needed (rash). Patient taking differently: Apply 1 Application topically 2 (two) times daily as needed (recurrent rash). 02/28/23   Iva Marty Saltness, MD  triamcinolone  ointment (KENALOG ) 0.1 % Apply 1 Application topically at bedtime. 10/24/23   Iva Marty Saltness, MD  Vibegron  (GEMTESA ) 75 MG TABS Take 1 tablet (75 mg total) by mouth daily. 03/13/24   Guadlupe Lianne DASEN, MD    Allergies: Sulfa antibiotics and Venofer  [iron  sucrose]    Review of Systems  Respiratory:  Positive for shortness of breath.   Cardiovascular:   Positive for chest pain.  Neurological:  Positive for dizziness.    Updated Vital Signs BP (!) 100/50   Pulse 77   Temp 98 F (36.7 C)   Resp 20   SpO2 97%   Physical Exam Vitals and nursing note reviewed.  Constitutional:      General: She is not in acute distress.    Appearance: She is not toxic-appearing.  HENT:     Head: Normocephalic.  Cardiovascular:     Rate and Rhythm: Regular rhythm.     Heart sounds: Normal heart sounds.  Pulmonary:     Breath sounds: Normal breath sounds. No wheezing or rhonchi.  Musculoskeletal:     Right lower leg: No edema.     Left lower leg: No edema.  Skin:  General: Skin is warm.     Capillary Refill: Capillary refill takes less than 2 seconds.  Neurological:     General: No focal deficit present.     Mental Status: She is alert.  Psychiatric:        Mood and Affect: Mood normal.        Behavior: Behavior normal.     (all labs ordered are listed, but only abnormal results are displayed) Labs Reviewed  CBC - Abnormal; Notable for the following components:      Result Value   WBC 13.2 (*)    RBC 2.99 (*)    Hemoglobin 6.7 (*)    HCT 22.8 (*)    MCV 76.3 (*)    MCH 22.4 (*)    MCHC 29.4 (*)    RDW 18.0 (*)    Platelets 467 (*)    All other components within normal limits  I-STAT CHEM 8, ED - Abnormal; Notable for the following components:   Glucose, Bld 103 (*)    Calcium , Ion 1.10 (*)    Hemoglobin 7.8 (*)    HCT 23.0 (*)    All other components within normal limits  POC OCCULT BLOOD, ED - Abnormal; Notable for the following components:   Fecal Occult Bld POSITIVE (*)    All other components within normal limits  TYPE AND SCREEN  PREPARE RBC (CROSSMATCH)    EKG: None  Radiology: DG Chest Portable 1 View Result Date: 09/30/2024 EXAM: 1 VIEW(S) XRAY OF THE CHEST 09/30/2024 06:12:00 PM COMPARISON: Comparison with 08/12/2023. CLINICAL HISTORY: shob. Reason for exam: Low Hgb; Chest Pain; Shortness of Breath;  Dizziness Low Hgb; Chest Pain; Shortness of Breath; Dizziness FINDINGS: LUNGS AND PLEURA: No focal pulmonary opacity. No pulmonary edema. No pleural effusion. No pneumothorax. HEART AND MEDIASTINUM: No acute abnormality of the cardiac and mediastinal silhouettes. BONES AND SOFT TISSUES: No acute osseous abnormality. IMPRESSION: 1. No acute cardiopulmonary process. Electronically signed by: Norman Gatlin MD 09/30/2024 06:23 PM EDT RP Workstation: [id number]     .Critical Care  Performed by: Neysa Caron PARAS, DO Authorized by: Neysa Caron PARAS, DO   Critical care provider statement:    Critical care time (minutes):  30   Critical care was necessary to treat or prevent imminent or life-threatening deterioration of the following conditions:  Circulatory failure   Critical care was time spent personally by me on the following activities:  Development of treatment plan with patient or surrogate, discussions with consultants, evaluation of patient's response to treatment, examination of patient, ordering and review of laboratory studies, ordering and review of radiographic studies, ordering and performing treatments and interventions, pulse oximetry, re-evaluation of patient's condition and review of old charts   Care discussed with: admitting provider      Medications Ordered in the ED  0.9 %  sodium chloride  infusion (Manually program via Guardrails IV Fluids) (0 mLs Intravenous Hold 09/30/24 1844)                                    Medical Decision Making This is a 63 year old female presenting emergency department with fatigue and anemia.  Afebrile nontachycardic, hemodynamically stable.  No significant complaints while lying in the bed, but does have some fatigue and dyspnea on exertion consistent with symptomatic anemia.  2 units of blood ordered.  Does not have overt blood loss, however fecal occult blood was positive today.  Did discuss  with GI, , to see patient in consult.  Given  patient's severe anemia requiring transfusion in the setting of fecal occult positive will admit for observation.  Case discussed with hospitalist who agrees to see patient.  Amount and/or Complexity of Data Reviewed External Data Reviewed:     Details: Not on blood thinner Labs: ordered.    Details: No elevation in BUN to suggest upper GI bleed Radiology: ordered.    Details: Chest x-ray without pneumonia pneumothorax  Risk Prescription drug management. Decision regarding hospitalization. Diagnosis or treatment significantly limited by social determinants of health. Risk Details: Poor health literacy.  Medication noncompliance.       Final diagnoses:  Anemia, unspecified type    ED Discharge Orders     None          Neysa Caron PARAS, DO 09/30/24 2024

## 2024-09-30 NOTE — ED Notes (Signed)
 MD aware of critical Hg of 6.7

## 2024-09-30 NOTE — Patient Instructions (Addendum)
 Please take all new medication as prescribed  - the prednisone   Please continue all other medications as before, and refills have been done if requested.  Please have the pharmacy call with any other refills you may need.  Please keep your appointments with your specialists as you may have planned  You will be contacted regarding the referral for: podiatry, psychiatry, and counseling  Please go to the LAB at the blood drawing area for the tests to be done, including the liver and iron  testing  You will be contacted by phone if any changes need to be made immediately.  Otherwise, you will receive a letter about your results with an explanation, but please check with MyChart first.  Please make an Appointment to return in 6 months, or sooner if needed

## 2024-09-30 NOTE — Telephone Encounter (Signed)
 Please to call nurse - pt needs to go to ED now.   thanks

## 2024-09-30 NOTE — ED Triage Notes (Addendum)
 Pt came in d/t her PCP lab work showing today that her Hgb was 6.6. Also reports feeling dizzy plus some CP that she said may be her acid reflux & having a hard time breathing sometimes when she walks long distances.

## 2024-09-30 NOTE — H&P (Signed)
 History and Physical    [name] FMW:[id number] DOB: 01/14/1961 DOA: 09/30/2024  Patient coming from: Home.  Chief Complaint: Low hemoglobin.  HPI: [name] is a 63 y.o. female with history of COPD/asthma overlap, hypertension, seizures, depression, hyperlipidemia and iron  deficiency anemia who has not been taking her iron  supplements had followed up with her primary care physician and had routine labs done which showed low hemoglobin around 6 and was advised to come to the ER.  Patient states she has been having some exertional weakness over the last few weeks but last few days it got worse.  Patient also has been having some pruritus with no rash and heel pain concerning for plantar fasciitis.  Denies noticing any blood in the stools or black stools.  Denies taking any NSAIDs.  Denies chest pain nausea vomiting fever chills or hematuria.  Has had an EGD in 2022 which showed gastric ulcers.  In 2024 EGD showed angiectasia.  Colonoscopy in 2022 showed diverticulosis and internal hemorrhoids.  ED Course: In the ER patient's hemoglobin was around 6.7.  Stool for occult blood was positive.  2 units of PRBC transfusion was ordered. [facility] was consulted.  Blood pressure is in the low normal.  Ferritin levels were 7.7.  Creatinine 0.8 AST ALT and alk phos were within normal limits.  Bilirubin was 0.2.  Review of Systems: As per HPI, rest all negative.   Past Medical History:  Diagnosis Date   Allergy     Anemia, iron  deficiency 12/22/2014   pt. 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      Asthma    Bipolar disorder (HCC)    with anxiety, depression   COPD (chronic obstructive pulmonary disease) (HCC)    Depression    Eczema 05/18/2022   Emphysema of  lung (HCC)    Fibromyalgia 05/14/2014   GERD (gastroesophageal reflux disease)    History of kidney stones    Hyperlipidemia 04/20/2017   HYPERTENSION 05/12/2009   Qualifier: Diagnosis of  By: Primus LATHER LEODIS), [name]     Osteoporosis    Peripheral vascular disease    Pneumonia    Prediabetes 02/23/2014   pt. denies   Seizure (HCC)    Sleep apnea    Stroke (HCC) 11/2020   Urticaria    Vitamin D  deficiency     Past Surgical History:  Procedure Laterality Date   ABDOMINAL HYSTERECTOMY     ANTERIOR CERVICAL DECOMP/DISCECTOMY FUSION N/A 07/28/2020   Procedure: ANTERIOR CERVICAL DECOMPRESSION/DISCECTOMY FUSION. INTERBODY PROTHESIS, PLATE/SCREWS CERVICAL THREE- CERVICAL FOUR, CERVICAL FOUR- CERVICAL FIVE;  Surgeon: Mavis Purchase, MD;  Location: [facility] OR;  Service: Neurosurgery;  Laterality: N/A;   BACK SURGERY     COLONOSCOPY  12/20/2011   Procedure: COLONOSCOPY;  Surgeon: Elsie CHRISTELLA Cree, MD;  Location: [facility] ENDOSCOPY;  Service: Endoscopy;  Laterality: N/A;   COLONOSCOPY  03/05/2012   Procedure: COLONOSCOPY;  Surgeon: Elsie CHRISTELLA Cree, MD;  Location: [facility] ENDOSCOPY;  Service: Endoscopy;  Laterality: N/A;   DIAGNOSTIC LAPAROSCOPY     ESOPHAGOGASTRODUODENOSCOPY (EGD) WITH PROPOFOL  N/A 10/31/2023   Procedure: ESOPHAGOGASTRODUODENOSCOPY (EGD) WITH PROPOFOL ;  Surgeon: Legrand Victory LITTIE DOUGLAS, MD;  Location: [facility] ENDOSCOPY;  Service: Gastroenterology;  Laterality: N/A;   HEMORRHOID SURGERY     INCISE AND DRAIN ABCESS  KIDNEY STONE SURGERY     NECK SURGERY     x 2 Dr JINNY Budge   SPINE SURGERY  2019   TOE SURGERY     TUBAL LIGATION       reports that she quit smoking about 6 years ago. Her smoking use included cigarettes. She started smoking about 46 years ago. She has a 20 pack-year smoking history. She has never been exposed to tobacco smoke. She has never used smokeless tobacco. She reports that she does not currently use alcohol . She reports that she does not currently use drugs after  having used the following drugs: Marijuana.  Allergies  Allergen Reactions   Sulfa Antibiotics Nausea And Vomiting and Other (See Comments)   Venofer  [Iron  Sucrose] Hives    Family History  Problem Relation Age of Onset   Diabetes Mother    COPD Mother    Heart disease Mother    Asthma Mother    Diabetes Father    Kidney disease Father    Anesthesia problems Father    Alcohol  abuse Father    Colon cancer Father 64   Diabetes Sister    Hypertension Sister    Heart disease Sister    Diabetes Sister    Brain cancer Sister    Hypertension Sister    Asthma Sister    Diabetes Sister    COPD Sister    Hypertension Sister    Breast cancer Sister 74   Anemia Sister    Hypertension Sister    Diabetes Sister    Anemia Sister    Hypertension Sister    Diabetes Brother    Sleep apnea Brother    Asthma Brother    Alcohol  abuse Brother    Heart disease Brother    COPD Brother    Depression Brother    Heart disease Brother    Asthma Brother    Hypertension Brother    Hypertension Brother    Hypertension Daughter    Anemia Daughter    Allergic rhinitis Neg Hx    Eczema Neg Hx    Urticaria Neg Hx    Esophageal cancer Neg Hx    Prostate cancer Neg Hx    Rectal cancer Neg Hx     Prior to Admission medications   Medication Sig Start Date End Date Taking? Authorizing Provider  AIRSUPRA  90-80 MCG/ACT AERO SMARTSIG:2 Puff(s) Via Inhaler 4 Times Daily PRN 05/10/24   [provider]  albuterol  (PROVENTIL ) (2.5 MG/3ML) 0.083% nebulizer solution USE 1 VIAL VIA NEBULIZER EVERY 4 HOURS AS NEEDED FOR WHEEZING OR SHORTNESS OF BREATH 03/26/24   Iva Marty Saltness, MD  albuterol  (VENTOLIN  HFA) 108 [phone] Base) MCG/ACT inhaler INHALE 2 PUFFS INTO THE LUNGS EVERY 6 HOURS AS NEEDED FOR WHEEZING OR SHORTNESS OF BREATH 09/17/24   Cheryl Reusing, FNP  amLODipine  (NORVASC ) 5 MG tablet Take 1 tablet (5 mg total) by mouth daily. 04/21/24   Patwardhan, Newman JINNY, MD  ARIPiprazole  (ABILIFY ) 10  MG tablet Take 1 tablet (10 mg total) by mouth daily. Take Abilify  20 mg po daily for 2 weeks till 9/17 , then continue taking 10 mg po daily 08/20/23   Drusilla Sabas RAMAN, MD  Aspirin -Salicylamide-Caffeine  (BC HEADACHE POWDER PO) Take 1 packet by mouth daily.    [provider]  atorvastatin  (LIPITOR) 80 MG tablet Take 1 tablet (80 mg total) by mouth daily. 07/22/24   Patwardhan, Newman JINNY, MD  azelastine  (OPTIVAR ) 0.05 % ophthalmic solution Place 1 drop into both eyes  2 (two) times daily. 05/18/22   Norleen Lynwood ORN, MD  benralizumab  (FASENRA  PEN) 30 MG/ML prefilled autoinjector Inject 1 mL (30 mg total) into the skin every 28 (twenty-eight) days. For 3 doses then every 8 weeks 10/29/23   Iva Marty Saltness, MD  BREZTRI  AEROSPHERE 160-9-4.8 MCG/ACT AERO inhaler INHALE 2 PUFFS INTO THE LUNGS IN THE MORNING AND AT BEDTIME 08/10/24   Shelah Lamar RAMAN, MD  budesonide  (PULMICORT ) 0.5 MG/2ML nebulizer solution USE 2 ML(0.5 MG) VIA NEBULIZER IN THE MORNING AND AT BEDTIME 09/11/24   Iva Marty Saltness, MD  carbamazepine  (TEGRETOL ) 200 MG tablet Take 2-3 tablets (400-600 mg total) by mouth See admin instructions. Take 400mg  in the morning and 600mg  at bedtime. 02/06/23   Plovsky, Elna, MD  cromolyn  (OPTICROM ) 4 % ophthalmic solution Place 1 drop into both eyes 4 (four) times daily. 11/19/23   Iva Marty Saltness, MD  doxepin  (SINEQUAN ) 25 MG capsule TAKE 1 CAPSULE(25 MG) BY MOUTH AT BEDTIME AS NEEDED 05/25/24   Iva Marty Saltness, MD  Ensifentrine  (OHTUVAYRE ) 3 MG/2.5ML SUSP Inhale 3 mg into the lungs in the morning and at bedtime. 01/03/24   Iva Marty Saltness, MD  estradiol  (ESTRACE ) 0.1 MG/GM vaginal cream Place 1g nightly 2-3 times a week 03/13/24   Guadlupe Dull T, MD  famotidine  (PEPCID ) 40 MG tablet Take 1 tablet (40 mg total) by mouth 2 (two) times daily. 10/24/23   Iva Marty Saltness, MD  ferrous sulfate  325 (65 FE) MG EC tablet TAKE 1 TABLET(325 MG) BY MOUTH DAILY WITH BREAKFAST 06/08/24   Norleen Lynwood ORN, MD  fluticasone  (FLONASE ) 50 MCG/ACT nasal spray SHAKE LIQUID AND USE 1 SPRAY IN EACH NOSTRIL TWICE DAILY AS NEEDED FOR ALLERGIES OR RHINITIS 01/24/24   Iva Marty Saltness, MD  formoterol  (PERFOROMIST ) 20 MCG/2ML nebulizer solution Take 20 mcg by nebulization 2 (two) times daily. 07/23/23   [provider]  furosemide  (LASIX ) 20 MG tablet TAKE 1 TABLET BY MOUTH DAILY AS NEEDED 12/30/23   Norleen Lynwood ORN, MD  hydrOXYzine  (VISTARIL ) 25 MG capsule Take one tablet every 6-8 hours as needed for itching. 02/05/24   Iva Marty Saltness, MD  ipratropium (ATROVENT  HFA) 17 MCG/ACT inhaler Inhale 2 puffs into the lungs every 4 (four) hours as needed for wheezing. 02/20/24   Iva Marty Saltness, MD  irbesartan  (AVAPRO ) 150 MG tablet Take 1 tablet (150 mg total) by mouth daily. 03/25/24   Norleen Lynwood ORN, MD  levocetirizine (XYZAL ) 5 MG tablet Take 1 tablet (5 mg total) by mouth in the morning and at bedtime. 09/09/24   Cheryl Reusing, FNP  montelukast  (SINGULAIR ) 10 MG tablet TAKE 1 TABLET(10 MG) BY MOUTH AT BEDTIME 08/12/24   Iva Marty Saltness, MD  Nebulizers (PARI PRONEB MAX LC SPRINT) MISC  01/09/24   [provider]  omeprazole  (PRILOSEC) 40 MG capsule Take 1 capsule (40 mg total) by mouth daily. 03/06/24   Norleen Lynwood ORN, MD  ondansetron  (ZOFRAN -ODT) 4 MG disintegrating tablet DISSOLVE 1 TABLET(4 MG) ON THE TONGUE EVERY 8 HOURS AS NEEDED FOR NAUSEA OR VOMITING 07/20/24   Norleen Lynwood ORN, MD  predniSONE  (DELTASONE ) 10 MG tablet 3 tabs by mouth per day for 3 days,2tabs per day for 3 days,1tab per day for 3 days 09/30/24   Norleen Lynwood ORN, MD  Skin Protectants, Misc. (EUCERIN) cream Apply 1 Application topically daily as needed for dry skin.    [provider]  tacrolimus  (PROTOPIC ) 0.03 % ointment Apply 1 Application topically 2 (  two) times daily as needed (rash). Patient taking differently: Apply 1 Application topically 2 (two) times daily as needed (recurrent rash). 02/28/23   Iva Marty Saltness, MD  triamcinolone  ointment (KENALOG ) 0.1 % Apply 1 Application topically at bedtime. 10/24/23   Iva Marty Saltness, MD  Vibegron  (GEMTESA ) 75 MG TABS Take 1 tablet (75 mg total) by mouth daily. 03/13/24   Guadlupe Lianne DASEN, MD    Physical Exam: Constitutional: Moderately built and nourished. Vitals:   09/30/24 1928 09/30/24 1930 09/30/24 1945 09/30/24 1947  BP: (!) 111/58 (!) 111/58 (!) 100/50 (!) 100/50  Pulse: 76 76 77   Resp: 17 17 20    Temp: 98 F (36.7 C) 98 F (36.7 C) 98 F (36.7 C) 98 F (36.7 C)  TempSrc: Oral Oral Oral   SpO2: 99% 99% 97%    Eyes: Anicteric pallor present. ENMT: No discharge from the ears eyes nose or mouth. Neck: No mass felt.  No neck rigidity. Respiratory: No rhonchi or crepitations. Cardiovascular: S1-S2 heard. Abdomen: Soft nontender bowel sound present. Musculoskeletal: No edema. Skin: No rash. Neurologic: Alert awake oriented to time place and person.  Moves all extremities. Psychiatric: Appears normal.  Normal affect.   Labs on Admission: I have personally reviewed following labs and imaging studies  CBC: Recent Labs  Lab 09/30/24 1342 09/30/24 1759 09/30/24 1813  WBC 10.3 13.2*  --   NEUTROABS 7.4  --   --   HGB 6.6 cL* 6.7* 7.8*  HCT 21.5 cL* 22.8* 23.0*  MCV 71.4* 76.3*  --   PLT 482.0* 467*  --    Basic Metabolic Panel: Recent Labs  Lab 09/30/24 1342 09/30/24 1813  NA 142 142  K 3.9 3.7  CL 104 104  CO2 30  --   GLUCOSE 116* 103*  BUN 12 12  CREATININE 0.85 0.90  CALCIUM  9.0  --    GFR: Estimated Creatinine Clearance: 68 mL/min (by C-G formula based on SCr of 0.9 mg/dL). Liver Function Tests: Recent Labs  Lab 09/30/24 1342  AST 21  ALT 22  ALKPHOS 104  BILITOT 0.2  PROT 6.7  ALBUMIN 4.1   No results for input(s): LIPASE, AMYLASE in the last 168 hours. No results for input(s): AMMONIA in the last 168 hours. Coagulation Profile: No results for input(s): INR, PROTIME in the last 168  hours. Cardiac Enzymes: No results for input(s): CKTOTAL, CKMB, CKMBINDEX, TROPONINI in the last 168 hours. BNP (last 3 results) Recent Labs    04/21/24 1157  PROBNP 127   HbA1C: No results for input(s): HGBA1C in the last 72 hours. CBG: No results for input(s): GLUCAP in the last 168 hours. Lipid Profile: No results for input(s): CHOL, HDL, LDLCALC, TRIG, CHOLHDL, LDLDIRECT in the last 72 hours. Thyroid  Function Tests: No results for input(s): TSH, T4TOTAL, FREET4, T3FREE, THYROIDAB in the last 72 hours. Anemia Panel: Recent Labs    09/30/24 1342  FERRITIN 7.7*  TIBC 401.8  IRON  17*   Urine analysis:    Component Value Date/Time   COLORURINE YELLOW 12/25/2023 1504   APPEARANCEUR CLEAR 12/25/2023 1504   LABSPEC 1.020 12/25/2023 1504   PHURINE 6.0 12/25/2023 1504   GLUCOSEU NEGATIVE 12/25/2023 1504   HGBUR NEGATIVE 12/25/2023 1504   BILIRUBINUR Negative 03/13/2024 1137   KETONESUR NEGATIVE 12/25/2023 1504   PROTEINUR Negative 03/13/2024 1137   PROTEINUR NEGATIVE 08/12/2023 0209   UROBILINOGEN 0.2 03/13/2024 1137   UROBILINOGEN 0.2 12/25/2023 1504   NITRITE Negative 03/13/2024 1137   NITRITE  NEGATIVE 12/25/2023 1504   LEUKOCYTESUR Negative 03/13/2024 1137   LEUKOCYTESUR NEGATIVE 12/25/2023 1504   Sepsis Labs: @LABRCNTIP (procalcitonin:4,lacticidven:4) )No results found for this or any previous visit (from the past 240 hours).   Radiological Exams on Admission: DG Chest Portable 1 View Result Date: 09/30/2024 EXAM: 1 VIEW(S) XRAY OF THE CHEST 09/30/2024 06:12:00 PM COMPARISON: Comparison with 08/12/2023. CLINICAL HISTORY: shob. Reason for exam: Low Hgb; Chest Pain; Shortness of Breath; Dizziness Low Hgb; Chest Pain; Shortness of Breath; Dizziness FINDINGS: LUNGS AND PLEURA: No focal pulmonary opacity. No pulmonary edema. No pleural effusion. No pneumothorax. HEART AND MEDIASTINUM: No acute abnormality of the cardiac and mediastinal  silhouettes. BONES AND SOFT TISSUES: No acute osseous abnormality. IMPRESSION: 1. No acute cardiopulmonary process. Electronically signed by: Norman Gatlin MD 09/30/2024 06:23 PM EDT RP Workstation: [id number]    EKG: Independently reviewed.  Normal sinus rhythm.  Assessment/Plan Active Problems:   HTN (hypertension)   COPD/asthma   Depression   Anxiety   Symptomatic anemia   GI bleeding    Symptomatic anemia with GI bleed and iron  deficiency anemia -      patient is receiving 2 units of PRBC transfusion.  Given the prior history of peptic ulcer disease and angiectasia will keep patient on Protonix  IV clear liquids for now.  Ferritin was 7.7.  Follow CBC after transfusion.  [facility] has been consulted. Iron  deficiency anemia -   receiving blood transfusion at this time.  May need IV iron . COPD/asthma overlap not wheezing will continue Brezteri and as needed nebulizer. Hypertension takes amlodipine  and ARB which we will hold for now since blood pressures in the low normal. Depression on Abilify . Seizures on Tegretol . Hyperlipidemia on statins.  Since patient has symptomatic anemia with iron  deficiency anemia and GI bleed will need close monitoring further workup and more than 2 midnight stay.   DVT prophylaxis: SCDs. Code Status: Full code. Family Communication: Discussed with patient. Disposition Plan: Monitored bed. Consults called: [facility]. Admission status: Observation.

## 2024-09-30 NOTE — Progress Notes (Incomplete)
New Admission Note:   Arrival Method:  Mental Orientation: Telemetry:  Assessment: Completed Skin: IV: Pain:  Tubes: Safety Measures: Safety Fall Prevention Plan has been discussed.  Admission: Completed 5MW Orientation: Patient has been oriented to the room, unit and staff.  Family:  Orders have been reviewed and implemented. Will continue to monitor the patient. Call light has been placed within reach and bed alarm has been activated.   Charito Bokiagon BSN, RN-BC Phone number: [phone] 

## 2024-09-30 NOTE — Assessment & Plan Note (Signed)
 No recent overt bleeding, not taking oral iron  currently, with fatigue today, for f/u cbc iron  lab

## 2024-09-30 NOTE — Assessment & Plan Note (Signed)
 Stable today, exam benign

## 2024-09-30 NOTE — ED Notes (Signed)
 Given something to drink per admitting MD

## 2024-09-30 NOTE — Assessment & Plan Note (Signed)
 No rash or sweling, for trial prednisone  taper but also for LFTs and UA

## 2024-10-01 ENCOUNTER — Encounter (HOSPITAL_COMMUNITY): Payer: Self-pay | Admitting: Internal Medicine

## 2024-10-01 ENCOUNTER — Observation Stay (HOSPITAL_COMMUNITY)

## 2024-10-01 ENCOUNTER — Encounter (HOSPITAL_COMMUNITY): Admission: RE | Admit: 2024-10-01 | Source: Ambulatory Visit

## 2024-10-01 ENCOUNTER — Encounter (HOSPITAL_COMMUNITY): Admission: EM | Disposition: A | Discharge status: Home / Self Care | Payer: Self-pay | Source: Home / Self Care

## 2024-10-01 ENCOUNTER — Other Ambulatory Visit (HOSPITAL_COMMUNITY): Payer: Self-pay

## 2024-10-01 DIAGNOSIS — D500 Iron deficiency anemia secondary to blood loss (chronic): Secondary | ICD-10-CM

## 2024-10-01 DIAGNOSIS — I2510 Atherosclerotic heart disease of native coronary artery without angina pectoris: Secondary | ICD-10-CM | POA: Diagnosis not present

## 2024-10-01 DIAGNOSIS — D509 Iron deficiency anemia, unspecified: Secondary | ICD-10-CM

## 2024-10-01 DIAGNOSIS — I10 Essential (primary) hypertension: Secondary | ICD-10-CM | POA: Diagnosis not present

## 2024-10-01 DIAGNOSIS — Z791 Long term (current) use of non-steroidal anti-inflammatories (NSAID): Secondary | ICD-10-CM | POA: Diagnosis not present

## 2024-10-01 DIAGNOSIS — D649 Anemia, unspecified: Secondary | ICD-10-CM | POA: Diagnosis not present

## 2024-10-01 DIAGNOSIS — K279 Peptic ulcer, site unspecified, unspecified as acute or chronic, without hemorrhage or perforation: Secondary | ICD-10-CM | POA: Diagnosis not present

## 2024-10-01 DIAGNOSIS — D62 Acute posthemorrhagic anemia: Secondary | ICD-10-CM | POA: Diagnosis not present

## 2024-10-01 DIAGNOSIS — K921 Melena: Secondary | ICD-10-CM | POA: Diagnosis not present

## 2024-10-01 DIAGNOSIS — Z87891 Personal history of nicotine dependence: Secondary | ICD-10-CM | POA: Diagnosis not present

## 2024-10-01 HISTORY — PX: ESOPHAGOGASTRODUODENOSCOPY: SHX5428

## 2024-10-01 LAB — TYPE AND SCREEN
ABO/RH(D): O POS
Antibody Screen: NEGATIVE
Unit division: 0
Unit division: 0

## 2024-10-01 LAB — BASIC METABOLIC PANEL WITH GFR
Anion gap: 10 (ref 5–15)
BUN: 10 mg/dL (ref 8–23)
CO2: 26 mmol/L (ref 22–32)
Calcium: 8.7 mg/dL — ABNORMAL LOW (ref 8.9–10.3)
Chloride: 102 mmol/L (ref 98–111)
Creatinine, Ser: 0.83 mg/dL (ref 0.44–1.00)
GFR, Estimated: >60 mL/min (ref >60)
Glucose, Bld: 143 mg/dL — ABNORMAL HIGH (ref 70–99)
Potassium: 4.2 mmol/L (ref 3.5–5.1)
Sodium: 138 mmol/L (ref 135–145)

## 2024-10-01 LAB — BPAM RBC
Blood Product Expiration Date: 202511062359
Blood Product Expiration Date: 202511062359
ISSUE DATE / TIME: 202510151923
ISSUE DATE / TIME: 202510152226
Unit Type and Rh: 5100
Unit Type and Rh: 5100

## 2024-10-01 LAB — CBC
HCT: 28.5 % — ABNORMAL LOW (ref 36.0–46.0)
Hemoglobin: 8.7 g/dL — ABNORMAL LOW (ref 12.0–15.0)
MCH: 23.6 pg — ABNORMAL LOW (ref 26.0–34.0)
MCHC: 30.5 g/dL (ref 30.0–36.0)
MCV: 77.4 fL — ABNORMAL LOW (ref 80.0–100.0)
Platelets: 435 K/uL — ABNORMAL HIGH (ref 150–400)
RBC: 3.68 MIL/uL — ABNORMAL LOW (ref 3.87–5.11)
RDW: 17.2 % — ABNORMAL HIGH (ref 11.5–15.5)
WBC: 12.4 K/uL — ABNORMAL HIGH (ref 4.0–10.5)
nRBC: 0 % (ref 0.0–0.2)

## 2024-10-01 LAB — HIV ANTIBODY (ROUTINE TESTING W REFLEX): HIV Screen 4th Generation wRfx: NONREACTIVE

## 2024-10-01 SURGERY — EGD (ESOPHAGOGASTRODUODENOSCOPY)
Anesthesia: Monitor Anesthesia Care

## 2024-10-01 MED ORDER — SODIUM CHLORIDE 0.9 % IV SOLN
INTRAVENOUS | Status: DC
Start: 2024-10-01 — End: 2024-10-01

## 2024-10-01 MED ORDER — PROPOFOL 500 MG/50ML IV EMUL
INTRAVENOUS | Status: DC | PRN
Start: 2024-10-01 — End: 2024-10-01
  Administered 2024-10-01: 150 ug/kg/min via INTRAVENOUS

## 2024-10-01 MED ORDER — FERROUS SULFATE 325 (65 FE) MG PO TBEC: 325.0000 mg | DELAYED_RELEASE_TABLET | Freq: Every day | ORAL | 1 refills | Status: AC

## 2024-10-01 MED ORDER — LEVOCETIRIZINE DIHYDROCHLORIDE 5 MG PO TABS: 5.0000 mg | ORAL_TABLET | Freq: Every evening | ORAL | Status: AC

## 2024-10-01 MED ORDER — SODIUM CHLORIDE 0.9 % IV SOLN: INTRAVENOUS | Status: DC | PRN

## 2024-10-01 MED ORDER — HYDROXYZINE PAMOATE 25 MG PO CAPS: 25.0000 mg | ORAL_CAPSULE | Freq: Every evening | ORAL | Status: AC | PRN

## 2024-10-01 MED ORDER — LIDOCAINE HCL (PF) 2 % IJ SOLN
INTRAMUSCULAR | Status: DC | PRN
  Administered 2024-10-01: 50 mg via INTRADERMAL

## 2024-10-01 MED ORDER — PANTOPRAZOLE SODIUM 40 MG PO TBEC
40.0000 mg | DELAYED_RELEASE_TABLET | Freq: Every day | ORAL | Status: DC
Start: 2024-10-01 — End: 2024-10-01

## 2024-10-01 MED ORDER — PROPOFOL 10 MG/ML IV BOLUS
INTRAVENOUS | Status: DC | PRN
  Administered 2024-10-01: 20 mg via INTRAVENOUS

## 2024-10-01 NOTE — Plan of Care (Signed)
   Problem: Education: Goal: Knowledge of General Education information will improve Description: Including pain rating scale, medication(s)/side effects and non-pharmacologic comfort measures Outcome: Progressing   Problem: Clinical Measurements: Goal: Respiratory complications will improve Outcome: Progressing
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
## 2024-10-01 NOTE — Discharge Summary (Signed)
 Physician Discharge Summary  [name] FMW:[id number] DOB: 07-Jun-1961  PCP: Norleen Lynwood ORN, MD  Admitted from: Home Discharged to: Home  Admit date: 09/30/2024 Discharge date: 10/01/2024  Recommendations for Outpatient Follow-up:    Follow-up Information     Norleen Lynwood ORN, MD. Schedule an appointment as soon as possible for a visit in 1 week(s).   Specialties: Internal Medicine, Radiology Why: To be seen with repeat labs (CBC & BMP).  Patient needs outpatient IV iron  infusion.  [facility] plans to arrange this. Contact information: [address] [place] [address] [phone]         Legrand Victory LITTIE DOUGLAS, MD. Schedule an appointment as soon as possible for a visit.   Specialty: Gastroenterology Why: Follow-up regarding outpatient video capsule endoscopy and IV iron  infusion. Contact information: [address] [place] [address] [phone]         Shelah Lamar RAMAN, MD. Call.   Specialty: Pulmonary Disease Why: Patient is on polypharmacy for her lung condition.  Several of these medications appear to be duplications.  Patient has been extensively counseled that she should follow-up with either one of her lung doctors in [place] or [facility] to go over these medications.  Patient verbalized understanding. Contact information: [address] [place] [address] [phone]         Govert, Fairy Redbird, MD. Call.   Specialty: Pulmonary Disease Contact information: [address] [facility] [place] [address] [place] [address] [phone]                  Home Health: None    Equipment/Devices: None    Discharge Condition: Improved and stable.   Code Status: Full Code Diet recommendation:  Discharge Diet Orders (From admission, onward)     Start     Ordered   10/01/24 0000  Diet - low sodium heart healthy        10/01/24 1811             Discharge Diagnoses:  Active Problems:   HTN  (hypertension)   COPD/asthma   Depression   Anxiety   Iron  deficiency anemia due to chronic blood loss   Symptomatic anemia   GI bleeding   Melena   Brief Summary: 63 y.o. female with history of COPD/asthma overlap, hypertension, seizures, depression, hyperlipidemia and iron  deficiency anemia who has not been taking her iron  supplements had followed up with her primary care physician and had routine labs done which showed low hemoglobin around 6 and was advised to come to the ER.  Patient states she has been having some exertional weakness over the last few weeks but last few days it got worse.  Patient also has been having some pruritus with no rash and heel pain concerning for plantar fasciitis.  To GI, she did volunteer to frequent use of NSAIDs,   Has had an EGD in 2022 which showed gastric ulcers.  In 2024 EGD showed angiectasia.  Colonoscopy in 2022 showed diverticulosis and internal hemorrhoids.   ED Course: In the ER patient's hemoglobin was around 6.7.  Stool for occult blood was positive.  2 units of PRBC transfusion was ordered. [facility] was consulted.  Blood pressure is in the low normal.  Ferritin levels were 7.7.  Creatinine 0.8 AST ALT and alk phos were within normal limits.  Bilirubin was 0.2.  Assessment and plan:  Acute on chronic iron  deficiency anemia FOBT positive In the context  of NSAIDs and recent course of steroids History of gastric angioectasia History of PUD Chronic GERD GI was consulted. EGD was essentially normal.  GI cleared her for discharge home and will arrange for outpatient follow-up for small bowel video capsule study and outpatient IV iron  transfusions. Continue PTA omeprazole  and Pepcid  Posterior units PRBC transfusion hemoglobin up from 6.7 on admission to 8.7.  Follow CBC closely as outpatient  COPD/asthma OSA Former smoker Patient reports that she follows with Dr. Shelah, [facility] pulmonology in [place] and Dr. Petra at [facility]. Stable  without clinical bronchospasm Polypharmacy noted on her home meds even after pharmacy did reconciliation.  Really not sure which 1 of these she is supposed to take or not.  Advised her that she should closely follow-up with either one of her outpatient pulmonologist to go over these medications.  She verbalized understanding. Reports that CPAP has been ordered and is awaiting to get the machine.  Essential hypertension Controlled.  Continue prior home meds  Depression Patient does not take Abilify .  Seizures Continue Tegretol   Hyperlipidemia Continue statins.   Consultations: [facility]  Procedures: EGD 10/16   Discharge Instructions  Discharge Instructions     Call MD for:  difficulty breathing, headache or visual disturbances   Complete by: As directed    Call MD for:  extreme fatigue   Complete by: As directed    Call MD for:  persistant dizziness or light-headedness   Complete by: As directed    Call MD for:  persistant nausea and vomiting   Complete by: As directed    Call MD for:  severe uncontrolled pain   Complete by: As directed    Call MD for:  temperature >100.4   Complete by: As directed    Diet - low sodium heart healthy   Complete by: As directed    Increase activity slowly   Complete by: As directed         Medication List     STOP taking these medications    ARIPiprazole  10 MG tablet Commonly known as: ABILIFY    BC HEADACHE POWDER PO   Fasenra  Pen 30 MG/ML prefilled autoinjector Generic drug: benralizumab        TAKE these medications    Airsupra  90-80 MCG/ACT Aero Generic drug: Albuterol -Budesonide  SMARTSIG:2 Puff(s) Via Inhaler 4 Times Daily PRN   albuterol  (2.5 MG/3ML) 0.083% nebulizer solution Commonly known as: PROVENTIL  USE 1 VIAL VIA NEBULIZER EVERY 4 HOURS AS NEEDED FOR WHEEZING OR SHORTNESS OF BREATH   albuterol  108 (90 Base) MCG/ACT inhaler Commonly known as: VENTOLIN  HFA INHALE 2 PUFFS INTO THE LUNGS EVERY 6 HOURS  AS NEEDED FOR WHEEZING OR SHORTNESS OF BREATH   amLODipine  5 MG tablet Commonly known as: NORVASC  Take 1 tablet (5 mg total) by mouth daily.   atorvastatin  80 MG tablet Commonly known as: LIPITOR Take 1 tablet (80 mg total) by mouth daily.   Atrovent  HFA 17 MCG/ACT inhaler Generic drug: ipratropium Inhale 2 puffs into the lungs every 4 (four) hours as needed for wheezing.   azelastine  0.05 % ophthalmic solution Commonly known as: OPTIVAR  Place 1 drop into both eyes 2 (two) times daily.   Breztri  Aerosphere 160-9-4.8 MCG/ACT Aero inhaler Generic drug: budesonide -glycopyrrolate-formoterol  INHALE 2 PUFFS INTO THE LUNGS IN THE MORNING AND AT BEDTIME   budesonide  0.5 MG/2ML nebulizer solution Commonly known as: PULMICORT  USE 2 ML(0.5 MG) VIA NEBULIZER IN THE MORNING AND AT BEDTIME   carbamazepine  200 MG tablet Commonly known as: TEGretol  Take 2-3  tablets (400-600 mg total) by mouth See admin instructions. Take 400mg  in the morning and 600mg  at bedtime.   cromolyn  4 % ophthalmic solution Commonly known as: OPTICROM  Place 1 drop into both eyes 4 (four) times daily.   doxepin  25 MG capsule Commonly known as: SINEQUAN  TAKE 1 CAPSULE(25 MG) BY MOUTH AT BEDTIME AS NEEDED   estradiol  0.1 MG/GM vaginal cream Commonly known as: ESTRACE  Place 1g nightly 2-3 times a week   famotidine  40 MG tablet Commonly known as: Pepcid  Take 1 tablet (40 mg total) by mouth 2 (two) times daily.   ferrous sulfate  325 (65 FE) MG EC tablet Take 1 tablet (325 mg total) by mouth daily with breakfast. What changed: See the new instructions.   fluticasone  50 MCG/ACT nasal spray Commonly known as: FLONASE  SHAKE LIQUID AND USE 1 SPRAY IN EACH NOSTRIL TWICE DAILY AS NEEDED FOR ALLERGIES OR RHINITIS What changed: See the new instructions.   formoterol  20 MCG/2ML nebulizer solution Commonly known as: PERFOROMIST  Take 20 mcg by nebulization 2 (two) times daily.   furosemide  20 MG tablet Commonly known  as: LASIX  TAKE 1 TABLET BY MOUTH DAILY AS NEEDED   Gemtesa  75 MG Tabs Generic drug: Vibegron  Take 1 tablet (75 mg total) by mouth daily.   hydrOXYzine  25 MG capsule Commonly known as: VISTARIL  Take 1 capsule (25 mg total) by mouth at bedtime as needed for itching. What changed:  how much to take how to take this when to take this reasons to take this additional instructions   irbesartan  150 MG tablet Commonly known as: Avapro  Take 1 tablet (150 mg total) by mouth daily.   levocetirizine 5 MG tablet Commonly known as: XYZAL  Take 1 tablet (5 mg total) by mouth every evening.   montelukast  10 MG tablet Commonly known as: SINGULAIR  TAKE 1 TABLET(10 MG) BY MOUTH AT BEDTIME   Ohtuvayre  3 MG/2.5ML Susp Generic drug: Ensifentrine  Inhale 3 mg into the lungs in the morning and at bedtime.   omeprazole  40 MG capsule Commonly known as: PRILOSEC Take 1 capsule (40 mg total) by mouth daily. What changed: when to take this   ondansetron  4 MG disintegrating tablet Commonly known as: ZOFRAN -ODT DISSOLVE 1 TABLET(4 MG) ON THE TONGUE EVERY 8 HOURS AS NEEDED FOR NAUSEA OR VOMITING   predniSONE  10 MG tablet Commonly known as: DELTASONE  3 tabs by mouth per day for 3 days,2tabs per day for 3 days,1tab per day for 3 days   tacrolimus  0.03 % ointment Commonly known as: Protopic  Apply 1 Application topically 2 (two) times daily as needed (rash). What changed: reasons to take this   triamcinolone  ointment 0.1 % Commonly known as: KENALOG  Apply 1 Application topically at bedtime.       Allergies  Allergen Reactions   Sulfa Antibiotics Nausea And Vomiting and Other (See Comments)   Venofer  [Iron  Sucrose] Hives      Procedures/Studies: DG Chest Portable 1 View Result Date: 09/30/2024 EXAM: 1 VIEW(S) XRAY OF THE CHEST 09/30/2024 06:12:00 PM COMPARISON: Comparison with 08/12/2023. CLINICAL HISTORY: shob. Reason for exam: Low Hgb; Chest Pain; Shortness of Breath; Dizziness Low  Hgb; Chest Pain; Shortness of Breath; Dizziness FINDINGS: LUNGS AND PLEURA: No focal pulmonary opacity. No pulmonary edema. No pleural effusion. No pneumothorax. HEART AND MEDIASTINUM: No acute abnormality of the cardiac and mediastinal silhouettes. BONES AND SOFT TISSUES: No acute osseous abnormality. IMPRESSION: 1. No acute cardiopulmonary process. Electronically signed by: Norman Gatlin MD 09/30/2024 06:23 PM EDT RP Workstation: [id number]  Subjective: By the time I went to see her this morning, she was already in endoscopy.  Went back multiple times but she was still in endoscopy.  Subsequently saw her postprocedure.  She denied complaints.  She specifically denied dizziness, lightheadedness, chest pain, dyspnea.  She was advised that she should have somebody pick her up postdischarge and she should not drive today.  She verbalized understanding.  Discharge Exam:  Vitals:   10/01/24 1220 10/01/24 1222 10/01/24 1230 10/01/24 1239  BP: 109/64  107/80 121/76  Pulse: 65 65 68 71  Resp: 16 17 14 17   Temp: (!) 95.9 F (35.5 C) (!) 96.7 F (35.9 C)    TempSrc: Temporal Oral    SpO2: 100% 100% 98% 97%  Weight:      Height:        General: Middle-age female, moderately built and obese lying comfortably propped up in bed without distress. Cardiovascular: S1 & S2 heard, RRR, S1/S2 +. No murmurs, rubs, gallops or clicks. No JVD or pedal edema. Respiratory: Clear to auscultation without wheezing, rhonchi or crackles. No increased work of breathing. Abdominal:  Non distended, non tender & soft. No organomegaly or masses appreciated. Normal bowel sounds heard. CNS: Alert and oriented. No focal deficits. Extremities: no edema, no cyanosis    The results of significant diagnostics from this hospitalization (including imaging, microbiology, ancillary and laboratory) are listed below for reference.     Microbiology: No results found for this or any previous visit (from the past 240 hours).    Labs: CBC: Recent Labs  Lab 09/30/24 1342 09/30/24 1759 09/30/24 1813 10/01/24 0813  WBC 10.3 13.2*  --  12.4*  NEUTROABS 7.4  --   --   --   HGB 6.6 cL* 6.7* 7.8* 8.7*  HCT 21.5 cL* 22.8* 23.0* 28.5*  MCV 71.4* 76.3*  --  77.4*  PLT 482.0* 467*  --  435*    Basic Metabolic Panel: Recent Labs  Lab 09/30/24 1342 09/30/24 1813 10/01/24 0813  NA 142 142 138  K 3.9 3.7 4.2  CL 104 104 102  CO2 30  --  26  GLUCOSE 116* 103* 143*  BUN 12 12 10   CREATININE 0.85 0.90 0.83  CALCIUM  9.0  --  8.7*    Liver Function Tests: Recent Labs  Lab 09/30/24 1342  AST 21  ALT 22  ALKPHOS 104  BILITOT 0.2  PROT 6.7  ALBUMIN 4.1     Anemia work up Recent Labs    09/30/24 1342  FERRITIN 7.7*  TIBC 401.8  IRON  17*    Time coordinating discharge: 25 minutes  SIGNED:  Trenda Mar, MD,  FACP, [facility], [facility], [facility]   [facility]  Hospitalist & Physician Advisor [place]     To contact the attending provider between 7A-7P or the covering provider during after hours 7P-7A, please log into the web site [email or web address] and access using universal [place] password for that web site. If you do not have the password, please call the hospital operator.

## 2024-10-01 NOTE — TOC CM/SW Note (Signed)
 Transition of Care [facility]) - Inpatient Brief Assessment   Patient Details  Name: [name] MRN: [id number] Date of Birth: 10/11/1961  [facility] of [facility]) CM/SW Contact:    Tom-Johnson, Harvest Muskrat, RN Phone Number: 10/01/2024, 10:29 AM   Clinical Narrative:  Patient sent to the ED by her PCP with abnormal lab, showing Hgb at 6.6 and reports Dizziness with some Chest pains. Patient's Hgb in the ED was 6.7, received 2U PRBC and post transfusion hgb showing at 8.7. Patient has hx of COPD/Asthma overlap, HTN, Seizures, Depression, Hyperlipidemia and Iron  Deficiency Anemia. GI following.   CM spoke with patient at bedside about needs for post hospital transition. Patient states she lives alone, has three children and six supportive siblings. Independent with care and drive self to and from appointments. Currently not employed, on disability. Has a cane, walker and shower seat at home.  PCP is Norleen Lynwood ORN, MD and uses [facility] on [address]  No ICM needs or recommendations noted at this time.  Patient not Medically ready for discharge.  CM will continue to follow as patient progresses with care towards discharge.              Transition of Care Asessment: Insurance and Status: Insurance coverage has been reviewed Patient has primary care physician: Yes Home environment has been reviewed: Yes Prior level of function:: Independent Prior/Current Home Services: No current home services Social Drivers of Health Review: SDOH reviewed no interventions necessary Readmission risk has been reviewed: Yes Transition of care needs: no transition of care needs at this time

## 2024-10-01 NOTE — Anesthesia Postprocedure Evaluation (Signed)
 Anesthesia Post Note  Patient: [name]  Procedure(s) Performed: EGD (ESOPHAGOGASTRODUODENOSCOPY)     Patient location during evaluation: PACU Anesthesia Type: MAC Level of consciousness: awake and alert Pain management: pain level controlled Vital Signs Assessment: post-procedure vital signs reviewed and stable Respiratory status: spontaneous breathing, nonlabored ventilation, respiratory function stable and patient connected to nasal cannula oxygen  Cardiovascular status: stable and blood pressure returned to baseline Postop Assessment: no apparent nausea or vomiting Anesthetic complications: no   No notable events documented.  Last Vitals:  Vitals:   10/01/24 1230 10/01/24 1239  BP: 107/80 121/76  Pulse: 68 71  Resp: 14 17  Temp:    SpO2: 98% 97%    Last Pain:  Vitals:   10/01/24 1239  TempSrc:   PainSc: 0-No pain                 Thom JONELLE Peoples

## 2024-10-01 NOTE — Op Note (Signed)
 [facility] Patient Name: [name] Procedure Date : 10/01/2024 MRN: [id number] Attending MD: Gustav ALONSO Mcgee , MD, [id number] Date of Birth: 1961/11/30 CSN: [id number] Age: 63 Admit Type: Inpatient Procedure:                Upper GI endoscopy Indications:              Iron  deficiency anemia due to suspected upper                            gastrointestinal bleeding, Normal colonoscopy in                            2022 Providers:                Gustav ALONSO Mcgee, MD, Gregoria Pierce, RN,                            Coye Bade, Technician Referring MD:              Medicines:                Monitored Anesthesia Care Complications:            No immediate complications. Estimated Blood Loss:     Estimated blood loss: none. Procedure:                Pre-Anesthesia Assessment:                           - Prior to the procedure, a History and Physical                            was performed, and patient medications and                            allergies were reviewed. The patient's tolerance of                            previous anesthesia was also reviewed. The risks                            and benefits of the procedure and the sedation                            options and risks were discussed with the patient.                            All questions were answered, and informed consent                            was obtained. Prior Anticoagulants: The patient has                            taken no anticoagulant or antiplatelet agents. ASA                            Grade  Assessment: III - A patient with severe                            systemic disease. After reviewing the risks and                            benefits, the patient was deemed in satisfactory                            condition to undergo the procedure.                           After obtaining informed consent, the endoscope was                            passed under  direct vision. Throughout the                            procedure, the patient's blood pressure, pulse, and                            oxygen  saturations were monitored continuously. The                            [id number]-H190 ([id number]) Olympus endoscope was introduced                            through the mouth, and advanced to the second part                            of duodenum. The upper GI endoscopy was                            accomplished without difficulty. The patient                            tolerated the procedure well. Scope In: Scope Out: Findings:      The Z-line was regular and was found 38 cm from the incisors.      No gross lesions were noted in the entire esophagus.      The stomach was normal.      The cardia and gastric fundus were normal on retroflexion.      The examined duodenum was normal. Impression:               - Z-line regular, 38 cm from the incisors.                           - No gross lesions in the entire esophagus.                           - Normal stomach.                           - Normal examined duodenum.                           -  No specimens collected. Recommendation:           - Resume previous diet.                           - Continue present medications.                           - No ibuprofen , naproxen, or other non-steroidal                            anti-inflammatory drugs.                           - Use Protonix  (pantoprazole ) 40 mg PO daily.                           - IV iron  infusion                           - Follow up in GI office next available appointment                            with Dr Legrand or APP, plan for small bowel video                            capsule study to complete work up for iron                             deficiency anemia                           - Inpatient GI signing off, available if have any                            questions Procedure Code(s):        --- Professional ---                            [phone], Esophagogastroduodenoscopy, flexible,                            transoral; diagnostic, including collection of                            specimen(s) by brushing or washing, when performed                            (separate procedure) Diagnosis Code(s):        --- Professional ---                           D50.9, Iron  deficiency anemia, unspecified CPT copyright 2022 American Medical Association. All rights reserved. The codes documented in this report are preliminary and upon coder review may  be revised to meet current compliance requirements. [name] V. [name], MD 10/01/2024 12:20:28 PM This report has been signed electronically. Number of Addenda: 0

## 2024-10-01 NOTE — Transfer of Care (Signed)
 Immediate Anesthesia Transfer of Care Note  Patient: [name]  Procedure(s) Performed: EGD (ESOPHAGOGASTRODUODENOSCOPY)  Patient Location: Endoscopy Unit  Anesthesia Type:MAC  Level of Consciousness: awake, alert , oriented, and patient cooperative  Airway & Oxygen  Therapy: Patient Spontanous Breathing and Patient connected to face mask oxygen   Post-op Assessment: Report given to RN, Post -op Vital signs reviewed and stable, Patient moving all extremities, and Patient moving all extremities X 4  Post vital signs: Reviewed and stable  Last Vitals:  Vitals Value Taken Time  BP 109/64 10/01/24 12:20  Temp 35.5 C 10/01/24 12:20  Pulse 65 10/01/24 12:20  Resp 16 10/01/24 12:20  SpO2 100 % 10/01/24 12:20    Last Pain:  Vitals:   10/01/24 1220  TempSrc: Temporal  PainSc: Asleep         Complications: No notable events documented.

## 2024-10-01 NOTE — Anesthesia Preprocedure Evaluation (Signed)
 Anesthesia Evaluation  Patient identified by MRN, date of birth, ID band Patient awake    Reviewed: Allergy  & Precautions, NPO status , Patient's Chart, lab work & pertinent test results  History of Anesthesia Complications Negative for: history of anesthetic complications  Airway Mallampati: III  TM Distance: >3 FB Neck ROM: Full    Dental no notable dental hx.    Pulmonary asthma , sleep apnea , COPD,  COPD inhaler, former smoker   breath sounds clear to auscultation       Cardiovascular hypertension, Pt. on medications (-) angina + CAD, + Peripheral Vascular Disease and + PND   Rhythm:Regular Rate:Normal  02/2023 ECHO: EF 60 to 65%.  1. The LV has normal function, no regional wall motion abnormalities. Left ventricular diastolic parameters were normal.   2. RVF is normal. The right ventricular size is normal. There is normal pulmonary artery systolic pressure.   3. The mitral valve is normal in structure. No evidence of MR. No evidence of mitral stenosis.   4. The aortic valve has an indeterminant number of cusps. There is mild  calcification of the aortic valve. There is mild thickening of the aortic valve. Aortic valve regurgitation is not visualized. No aortic stenosis is present.     Neuro/Psych  Headaches, Seizures -,  PSYCHIATRIC DISORDERS Anxiety Depression Bipolar Disorder   CVA, No Residual Symptoms    GI/Hepatic Neg liver ROS,GERD  Medicated and Controlled,,iron  deficiency anemia, hemoccult positive stool   Endo/Other    Class 3 obesityBMI 33.8  Renal/GU negative Renal ROS     Musculoskeletal  (+) Arthritis ,  Fibromyalgia -  Abdominal  (+) + obese  Peds  Hematology  (+) Blood dyscrasia, anemia Hg 8.7   Anesthesia Other Findings   Reproductive/Obstetrics                              Anesthesia Physical Anesthesia Plan  ASA: 3  Anesthesia Plan: MAC   Post-op Pain  Management: Minimal or no pain anticipated   Induction:   PONV Risk Score and Plan: 2 and Treatment may vary due to age or medical condition  Airway Management Planned: Natural Airway and Nasal Cannula  Additional Equipment: None  Intra-op Plan:   Post-operative Plan:   Informed Consent: I have reviewed the patients History and Physical, chart, labs and discussed the procedure including the risks, benefits and alternatives for the proposed anesthesia with the patient or authorized representative who has indicated his/her understanding and acceptance.     Dental advisory given  Plan Discussed with: CRNA and Surgeon  Anesthesia Plan Comments:          Anesthesia Quick Evaluation

## 2024-10-01 NOTE — Discharge Instructions (Signed)

## 2024-10-04 NOTE — Progress Notes (Signed)
 "  [name] [id number] 1961/01/09   Chief Complaint: Anemia  Referring Provider: Norleen Lynwood ORN, MD Primary GI MD: Dr. Legrand  HPI: [name] is a 63 y.o. female with past medical history of COPD/asthma overlap, CHF (05/28/2024 echo with LVEF 55-60%), GERD, stroke  HTN, seizures, depression, HLD , iron  deficiency anemia who presents today for hospital follow-up.    Patient admitted 09/30/2024 to 10/01/2024.  History of iron  deficiency anemia and had not been taking her iron  supplements, followed up with PCP and on routine labs found to have hemoglobin around 6 and advised to go to the ED.  Had some exertional weakness over the previous few weeks which had worsened in the previous few days.  Did tell GI she had frequent use of NSAIDs. History of EGD in 2022 which showed gastric ulcers.  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  Colonoscopy in 2022 showed diverticulosis and internal hemorrhoids. In the ED hemoglobin found to be 6.7.  Stool for occult blood was positive.  She received 2 units PRBCs, GI was consulted.  EGD was essentially normal. GI cleared for discharge home with plan to arrange VCE and outpatient iron  transfusions.  Continue omeprazole  and Pepcid .  Needs close monitoring of CBC.   Previous GI history: 06/09/2021 EGD with esophageal plaques found suspicious for candidiasis, nonbleeding gastric ulcers, gastric mucosal atrophy and otherwise normal.  At that time continued on Pantoprazole  40 mg daily and over-the-counter Omeprazole  20 mg with supper.  Started on iron .  Biopsies showed H. pylori infection, discussed previous treatment for this at [facility].  Recommended a 14-day course of Protonix  40 daily and Omeprazole  20 mg nightly as well as Rifabutin  300 mg daily x 14 and Amoxicillin  500 mg, 2 tabs twice daily.  Recommended breath test follow-up.    06/09/2021 colonoscopy with small lipoma in the proximal ascending colon, diverticulosis in the proximal  ascending colon, redundant colon internal hemorrhoids.  Repeat recommended in 5 years given a family history of colon cancer.    06/03/2023 office visit with Dr. Legrand, at that time discussing possibility of cirrhosis.  CT scan did incidentally reported nodular liver contour but workup was negative.  Recent ultrasound of the elastography was not suggestive of cirrhosis.  At that time it was thought that based on her overall clinical picture she did not have cirrhosis.    09/05/2023 patient seen by otolaryngology Nannette) for reflux.  At that time discussed chronic asthma and reflux and they doubled up on Protonix  and continued Pepcid .  Recommended referral to us  for EGD and possible workup for reflux surgery.   Upper endoscopy 10/31/2023 notable for incidental small nonbleeding gastric AVM, otherwise normal study. Saw allergy  clinic January 2025 (and before), started on some new medicine.  Allergic component asthma, rhinitis, urticaria  pulmonary clinic 01/22/2024 (and before), describing difficulties tolerating BiPAP.  Last office visit 02/04/2024 with Dr. Legrand for complaint of heartburn.  At that time had been doing well with no recent flareups of heartburn.  Had some nocturnal reflux symptoms.  Advised to consider elevating the head of her bed during sleep.  Noted to have chronic cough and dyspnea, multifactorial due to GERD, asthma, COPD, sleep apnea, allergic rhinitis.  Antireflux procedure such as TIF or fundoplication not advised for this patient.  Most recent CBC 10/01/2024 with hemoglobin 8.7.   Discussed the use of AI scribe software for clinical note transcription with the patient, who gave verbal consent to proceed.  History of Present Illness [name]  Enlow is a 63 year old female with iron  deficiency anemia who presents for hospital follow up.   She was admitted for anemia and received blood transfusions. An upper endoscopy showed essentially normal results, but stool was  positive for blood, indicating gastrointestinal bleeding. A colonoscopy performed a couple of years ago was normal, with no polyps, and she is due for a recall in 2027.  She experiences heartburn and acid reflux despite being on omeprazole  and Pepcid . She has episodes of waking up from sleep with a sensation like 'swallowed salt' coming back up, indicating persistent symptoms. Despite these symptoms, her upper endoscopy showed no significant findings.  She has chronic constipation, requiring over-the-counter stool softeners and laxatives. She avoids frequent laxative use due to concerns about dependency. She can go 2-3 weeks without a bowel movement, and this has been a long-standing issue.   She denies any rectal bleeding or melena. Has been prescribed iron  supplement but has not started taking, needs to pick up from pharmacy.  She has follow-up with allergy  and asthma 10/08/2024, and will be following up with PCP 10/09/2024.  Previous GI Procedures/Imaging   EGD 09/30/2024 - Z- line regular, 38 cm from the incisors.  - No gross lesions in the entire esophagus.  - Normal stomach.  - Normal examined duodenum. - No specimens collected  MRI abdomen 09/19/2024 No suspicious hepatic lesion. Specifically no lesion in the lateral left lobe of the liver correspond with findings on prior CT.  Colonoscopy 06/09/2021 - The examined portion of the ileum was normal.  - Small lipoma in the proximal ascending colon.  - Diverticulosis in the proximal ascending colon.  - Redundant colon.  - Internal hemorrhoids.  - The examination was otherwise normal on direct and retroflexion views.  - No specimens collected. - Recall 5 years due to family history of colon cancer  Past Medical History:  Diagnosis Date   Allergy     Anemia, iron  deficiency 12/22/2014   pt. denies   Anxiety    Arthritis    ASTHMA 05/12/2009   Severe AFL (Spirometry 05/2009: pre-BD FEV1 0.87L 34% pred, post-BD FEV1 1.11L 44%  pred) Volumes hyperinflated Decreased DLCO that does not fully correct to normal range for alveolar volume.      Asthma    Bipolar disorder (HCC)    with anxiety, depression   COPD (chronic obstructive pulmonary disease) (HCC)    Depression    Eczema 05/18/2022   Emphysema of lung (HCC)    Fibromyalgia 05/14/2014   GERD (gastroesophageal reflux disease)    History of kidney stones    Hyperlipidemia 04/20/2017   HYPERTENSION 05/12/2009   Qualifier: Diagnosis of  By: Primus LATHER LEODIS), [name]     Osteoporosis    Peripheral vascular disease    Pneumonia    Prediabetes 02/23/2014   pt. denies   Seizure (HCC)    Sleep apnea    Stroke (HCC) 11/2020   Urticaria    Vitamin D  deficiency     Past Surgical History:  Procedure Laterality Date   ABDOMINAL HYSTERECTOMY     ANTERIOR CERVICAL DECOMP/DISCECTOMY FUSION N/A 07/28/2020   Procedure: ANTERIOR CERVICAL DECOMPRESSION/DISCECTOMY FUSION. INTERBODY PROTHESIS, PLATE/SCREWS CERVICAL THREE- CERVICAL FOUR, CERVICAL FOUR- CERVICAL FIVE;  Surgeon: Mavis Purchase, MD;  Location: [facility] OR;  Service: Neurosurgery;  Laterality: N/A;   BACK SURGERY     COLONOSCOPY  12/20/2011   Procedure: COLONOSCOPY;  Surgeon: Elsie CHRISTELLA Cree, MD;  Location: [facility] ENDOSCOPY;  Service: Endoscopy;  Laterality: N/A;  COLONOSCOPY  03/05/2012   Procedure: COLONOSCOPY;  Surgeon: Elsie CHRISTELLA Cree, MD;  Location: [facility]COPY;  Service: Endoscopy;  Laterality: N/A;   DIAGNOSTIC LAPAROSCOPY     ESOPHAGOGASTRODUODENOSCOPY N/A 10/01/2024   Procedure: EGD (ESOPHAGOGASTRODUODENOSCOPY);  Surgeon: Nandigam, Kavitha V, MD;  Location: [facility] ENDOSCOPY;  Service: Gastroenterology;  Laterality: N/A;   ESOPHAGOGASTRODUODENOSCOPY (EGD) WITH PROPOFOL  N/A 10/31/2023   Procedure: ESOPHAGOGASTRODUODENOSCOPY (EGD) WITH PROPOFOL ;  Surgeon: Legrand Victory LITTIE DOUGLAS, MD;  Location: [facility] ENDOSCOPY;  Service: Gastroenterology;  Laterality: N/A;   HEMORRHOID SURGERY     INCISE AND DRAIN ABCESS     KIDNEY  STONE SURGERY     NECK SURGERY     x 2 Dr JINNY Budge   SPINE SURGERY  2019   TOE SURGERY     TUBAL LIGATION      Current Outpatient Medications  Medication Sig Dispense Refill   AIRSUPRA  90-80 MCG/ACT AERO SMARTSIG:2 Puff(s) Via Inhaler 4 Times Daily PRN     albuterol  (PROVENTIL ) (2.5 MG/3ML) 0.083% nebulizer solution USE 1 VIAL VIA NEBULIZER EVERY 4 HOURS AS NEEDED FOR WHEEZING OR SHORTNESS OF BREATH 75 mL 1   albuterol  (VENTOLIN  HFA) 108 (90 Base) MCG/ACT inhaler INHALE 2 PUFFS INTO THE LUNGS EVERY 6 HOURS AS NEEDED FOR WHEEZING OR SHORTNESS OF BREATH 18 g 3   amLODipine  (NORVASC ) 5 MG tablet Take 1 tablet (5 mg total) by mouth daily. 90 tablet 3   atorvastatin  (LIPITOR) 80 MG tablet Take 1 tablet (80 mg total) by mouth daily. 90 tablet 1   azelastine  (OPTIVAR ) 0.05 % ophthalmic solution Place 1 drop into both eyes 2 (two) times daily. 6 mL 12   BREZTRI  AEROSPHERE 160-9-4.8 MCG/ACT AERO inhaler INHALE 2 PUFFS INTO THE LUNGS IN THE MORNING AND AT BEDTIME 10.7 g 11   budesonide  (PULMICORT ) 0.5 MG/2ML nebulizer solution USE 2 ML(0.5 MG) VIA NEBULIZER IN THE MORNING AND AT BEDTIME 120 mL 2   carbamazepine  (TEGRETOL ) 200 MG tablet Take 2-3 tablets (400-600 mg total) by mouth See admin instructions. Take 400mg  in the morning and 600mg  at bedtime. 150 tablet 2   cromolyn  (OPTICROM ) 4 % ophthalmic solution Place 1 drop into both eyes 4 (four) times daily. 10 mL 12   doxepin  (SINEQUAN ) 25 MG capsule TAKE 1 CAPSULE(25 MG) BY MOUTH AT BEDTIME AS NEEDED 30 capsule 5   Ensifentrine  (OHTUVAYRE ) 3 MG/2.5ML SUSP Inhale 3 mg into the lungs in the morning and at bedtime. 150 mL 11   estradiol  (ESTRACE ) 0.1 MG/GM vaginal cream Place 1g nightly 2-3 times a week 30 g 3   famotidine  (PEPCID ) 40 MG tablet Take 1 tablet (40 mg total) by mouth 2 (two) times daily. 60 tablet 5   ferrous sulfate  325 (65 FE) MG EC tablet Take 1 tablet (325 mg total) by mouth daily with breakfast. 30 tablet 1   fluticasone  (FLONASE ) 50  MCG/ACT nasal spray SHAKE LIQUID AND USE 1 SPRAY IN EACH NOSTRIL TWICE DAILY AS NEEDED FOR ALLERGIES OR RHINITIS (Patient taking differently: Place 2 sprays into both nostrils daily.) 16 g 5   formoterol  (PERFOROMIST ) 20 MCG/2ML nebulizer solution Take 20 mcg by nebulization 2 (two) times daily.     furosemide  (LASIX ) 20 MG tablet TAKE 1 TABLET BY MOUTH DAILY AS NEEDED 30 tablet 5   hydrOXYzine  (VISTARIL ) 25 MG capsule Take 1 capsule (25 mg total) by mouth at bedtime as needed for itching.     ipratropium (ATROVENT  HFA) 17 MCG/ACT inhaler Inhale 2 puffs into the lungs  every 4 (four) hours as needed for wheezing. 1 each 5   irbesartan  (AVAPRO ) 150 MG tablet Take 1 tablet (150 mg total) by mouth daily. 90 tablet 3   levocetirizine (XYZAL ) 5 MG tablet Take 1 tablet (5 mg total) by mouth every evening.     montelukast  (SINGULAIR ) 10 MG tablet TAKE 1 TABLET(10 MG) BY MOUTH AT BEDTIME 90 tablet 1   omeprazole  (PRILOSEC) 40 MG capsule Take 1 capsule (40 mg total) by mouth daily. (Patient taking differently: Take 40 mg by mouth in the morning and at bedtime.) 90 capsule 3   ondansetron  (ZOFRAN -ODT) 4 MG disintegrating tablet DISSOLVE 1 TABLET(4 MG) ON THE TONGUE EVERY 8 HOURS AS NEEDED FOR NAUSEA OR VOMITING 30 tablet 1   predniSONE  (DELTASONE ) 10 MG tablet 3 tabs by mouth per day for 3 days,2tabs per day for 3 days,1tab per day for 3 days 18 tablet 0   tacrolimus  (PROTOPIC ) 0.03 % ointment Apply 1 Application topically 2 (two) times daily as needed (rash). (Patient taking differently: Apply 1 Application topically 2 (two) times daily as needed (recurrent rash).) 60 g 5   triamcinolone  ointment (KENALOG ) 0.1 % Apply 1 Application topically at bedtime. 30 g 5   Vibegron  (GEMTESA ) 75 MG TABS Take 1 tablet (75 mg total) by mouth daily. 30 tablet 11   Current Facility-Administered Medications  Medication Dose Route Frequency Provider Last Rate Last Admin   benralizumab  (FASENRA ) prefilled syringe 30 mg  30 mg  Subcutaneous Q8 Weeks Gallagher, Joel Louis, MD   30 mg at 09/02/24 9063    Allergies as of 10/05/2024 - Review Complete 10/05/2024  Allergen Reaction Noted   Sulfa antibiotics Nausea And Vomiting and Other (See Comments) 07/13/2011   Venofer  [iron  sucrose] Hives 05/09/2021    Family History  Problem Relation Age of Onset   Diabetes Mother    COPD Mother    Heart disease Mother    Asthma Mother    Diabetes Father    Kidney disease Father    Anesthesia problems Father    Alcohol  abuse Father    Colon cancer Father 22   Diabetes Sister    Hypertension Sister    Heart disease Sister    Diabetes Sister    Brain cancer Sister    Hypertension Sister    Asthma Sister    Diabetes Sister    COPD Sister    Hypertension Sister    Breast cancer Sister 14   Anemia Sister    Hypertension Sister    Diabetes Sister    Anemia Sister    Hypertension Sister    Diabetes Brother    Sleep apnea Brother    Asthma Brother    Alcohol  abuse Brother    Heart disease Brother    COPD Brother    Depression Brother    Heart disease Brother    Asthma Brother    Hypertension Brother    Hypertension Brother    Hypertension Daughter    Anemia Daughter    Allergic rhinitis Neg Hx    Eczema Neg Hx    Urticaria Neg Hx    Esophageal cancer Neg Hx    Prostate cancer Neg Hx    Rectal cancer Neg Hx     Social History   Tobacco Use   Smoking status: Former    Current packs/day: 0.00    Average packs/day: 0.5 packs/day for 40.0 years (20.0 ttl pk-yrs)    Types: Cigarettes    Start date: 78  Quit date: 04/2018    Years since quitting: 6.4    Passive exposure: Never   Smokeless tobacco: Never   Tobacco comments:    Dont Smoke  Vaping Use   Vaping status: Never Used  Substance Use Topics   Alcohol  use: Not Currently   Drug use: Not Currently    Types: Marijuana    Comment: reports she has stopped smoking marijuana     Review of Systems:    Constitutional: No weight loss, fever,  chills Cardiovascular: No chest pain Respiratory: No SOB or cough Gastrointestinal: See HPI and otherwise negative   Physical Exam:  Vital signs: BP (!) 114/58   Pulse 72   Ht 5' 2 (1.575 m) Comment: measured without shoes  Wt 189 lb 4 oz (85.8 kg)   BMI 34.61 kg/m   Wt Readings from Last 3 Encounters:  10/05/24 189 lb 4 oz (85.8 kg)  09/30/24 188 lb 4.4 oz (85.4 kg)  09/30/24 188 lb (85.3 kg)     Constitutional: Pleasant, obese female in NAD, alert and cooperative Head:  Normocephalic and atraumatic.  Eyes: No scleral icterus.  Respiratory: Respirations even and unlabored. Lungs clear to auscultation bilaterally.  No wheezes, crackles, or rhonchi.  Cardiovascular:  Regular rate and rhythm. No murmurs. No peripheral edema. Gastrointestinal:  Soft, nondistended, nontender. No rebound or guarding. Normal bowel sounds. No appreciable masses or hepatomegaly. Rectal:  Not performed.  Neurologic:  Alert and oriented x4;  grossly normal neurologically.  Skin:   Dry and intact without significant lesions or rashes. Psychiatric: Oriented to person, place and time. Demonstrates good judgement and reason without abnormal affect or behaviors.   RELEVANT LABS AND IMAGING: CBC    Component Value Date/Time   WBC 12.4 (H) 10/01/2024 0813   RBC 3.68 (L) 10/01/2024 0813   HGB 8.7 (L) 10/01/2024 0813   HGB 10.0 (L) 05/25/2024 1326   HGB 10.8 (L) 04/09/2024 1157   HCT 28.5 (L) 10/01/2024 0813   HCT 35.0 04/09/2024 1157   PLT 435 (H) 10/01/2024 0813   PLT 386 05/25/2024 1326   PLT 451 (H) 04/09/2024 1157   MCV 77.4 (L) 10/01/2024 0813   MCV 86 04/09/2024 1157   MCH 23.6 (L) 10/01/2024 0813   MCHC 30.5 10/01/2024 0813   RDW 17.2 (H) 10/01/2024 0813   RDW 16.1 (H) 04/09/2024 1157   LYMPHSABS 2.1 09/30/2024 1342   LYMPHSABS 1.7 04/09/2024 1157   MONOABS 0.7 09/30/2024 1342   EOSABS 0.0 09/30/2024 1342   EOSABS 0.0 04/09/2024 1157   BASOSABS 0.0 09/30/2024 1342   BASOSABS 0.0  04/09/2024 1157    CMP     Component Value Date/Time   NA 138 10/01/2024 0813   NA 144 07/31/2019 1151   K 4.2 10/01/2024 0813   CL 102 10/01/2024 0813   CO2 26 10/01/2024 0813   GLUCOSE 143 (H) 10/01/2024 0813   BUN 10 10/01/2024 0813   BUN 11 07/31/2019 1151   CREATININE 0.83 10/01/2024 0813   CREATININE 0.91 05/25/2024 1326   CREATININE 0.71 02/23/2014 1054   CALCIUM  8.7 (L) 10/01/2024 0813   PROT 6.7 09/30/2024 1342   PROT 6.5 07/31/2019 1151   ALBUMIN 4.1 09/30/2024 1342   ALBUMIN 4.3 07/31/2019 1151   AST 21 09/30/2024 1342   AST 15 05/25/2024 1326   ALT 22 09/30/2024 1342   ALT 15 05/25/2024 1326   ALKPHOS 104 09/30/2024 1342   BILITOT 0.2 09/30/2024 1342   BILITOT 0.3 05/25/2024 1326  GFRNONAA >60 10/01/2024 0813   GFRNONAA >60 05/25/2024 1326   GFRNONAA >89 02/23/2014 1054   GFRAA >60 07/19/2020 1113   GFRAA >89 02/23/2014 1054   Echocardiogram 05/28/2024 1. Left ventricular ejection fraction, by estimation, is 55 to 60% . The left ventricle has normal function. The left ventricle has no regional wall motion abnormalities. Left ventricular diastolic parameters were normal.  2. Right ventricular systolic function is normal. The right ventricular size is normal.  3. The mitral valve is normal in structure. No evidence of mitral valve regurgitation.  4. The aortic valve has an indeterminant number of cusps. There is mild calcification of the aortic valve. Aortic valve regurgitation is not visualized.  5. The inferior vena cava is normal in size with greater than 50% respiratory variability, suggesting right atrial pressure of 3 mmHg.  Assessment/Plan:   Iron  deficiency anemia GI bleed Chronic constipation GERD Patient seen today for hospital follow-up.  Admitted 09/30/2024 to 10/01/2024 after PCP labs showed a hemoglobin around 6 and she was advised to go to the ED.  On presentation hemoglobin found to be 6.7 and stool was positive for occult blood.  She received  2 units PRBCs, GI was consulted and she underwent EGD by Dr. Nandigam with no findings to explain anemia.  Advised to continue omeprazole  and Pepcid  and have outpatient video capsule endoscopy for further evaluation. Today states she is doing well and denies any overt GI bleeding.  She has been prescribed an iron  supplement and states that it is waiting for her at the pharmacy to be picked up.  She plans to pick this up today and start taking.  Continues with intermittent reflux despite use of PPI and H2RA.  Has had extensive workup and symptoms thought to be multifactorial due to GERD, asthma, COPD, sleep apnea, allergic rhinitis.  See HPI for more details.  She does have follow-up with allergy  and asthma, as well as follow-up with PCP this week.  Has history of chronic constipation for which she uses OTC laxatives as needed but is concerned about dependency and is not taking anything on a regular basis.  States sometimes she can go up to 2 to 3 weeks without a bowel movement.  States this was ongoing prior to her last colonoscopy. Last colonoscopy 05/2021 with findings of a redundant colon, diverticulosis, internal hemorrhoids otherwise unremarkable but with 5-year recall recommended due to family history of colon cancer.  Due 2027.  - Schedule video capsule endoscopy for further evaluation of iron  deficiency anemia and GI bleeding - Repeat CBC today - Advised to pick up iron  prescription and start taking - Start MiraLAX  1 capful daily for constipation and adjust dose to response.  If ineffective consider alternative therapies such as Linzess. - Follow-up 6 to 8 weeks   Camie Furbish, PA-C [facility] 10/05/2024, 11:08 AM  Patient Care Team: Norleen Lynwood ORN, MD as PCP - General (Internal Medicine) Elmira Newman PARAS, MD as PCP - Cardiology (Cardiology) Georgia Blonder, MD as Consulting Physician (Obstetrics and Gynecology)   "

## 2024-10-05 ENCOUNTER — Ambulatory Visit: Admitting: Gastroenterology

## 2024-10-05 ENCOUNTER — Encounter (HOSPITAL_COMMUNITY): Payer: Self-pay | Admitting: Gastroenterology

## 2024-10-05 ENCOUNTER — Ambulatory Visit (INDEPENDENT_AMBULATORY_CARE_PROVIDER_SITE_OTHER): Admitting: Podiatry

## 2024-10-05 ENCOUNTER — Other Ambulatory Visit

## 2024-10-05 ENCOUNTER — Encounter: Payer: Self-pay | Admitting: Podiatry

## 2024-10-05 ENCOUNTER — Ambulatory Visit (INDEPENDENT_AMBULATORY_CARE_PROVIDER_SITE_OTHER)

## 2024-10-05 VITALS — BP 114/58 | HR 72 | Ht 62.0 in | Wt 189.2 lb

## 2024-10-05 DIAGNOSIS — M722 Plantar fascial fibromatosis: Secondary | ICD-10-CM | POA: Diagnosis not present

## 2024-10-05 DIAGNOSIS — K5909 Other constipation: Secondary | ICD-10-CM

## 2024-10-05 DIAGNOSIS — K219 Gastro-esophageal reflux disease without esophagitis: Secondary | ICD-10-CM | POA: Diagnosis not present

## 2024-10-05 DIAGNOSIS — K922 Gastrointestinal hemorrhage, unspecified: Secondary | ICD-10-CM | POA: Diagnosis not present

## 2024-10-05 DIAGNOSIS — D509 Iron deficiency anemia, unspecified: Secondary | ICD-10-CM | POA: Diagnosis not present

## 2024-10-05 LAB — CBC WITH DIFFERENTIAL/PLATELET
Basophils Absolute: 0 K/uL (ref 0.0–0.1)
Basophils Relative: 0.1 % (ref 0.0–3.0)
Eosinophils Absolute: 0 K/uL (ref 0.0–0.7)
Eosinophils Relative: 0 % (ref 0.0–5.0)
HCT: 30.1 % — ABNORMAL LOW (ref 36.0–46.0)
Hemoglobin: 9.3 g/dL — ABNORMAL LOW (ref 12.0–15.0)
Lymphocytes Relative: 6.4 % — ABNORMAL LOW (ref 12.0–46.0)
Lymphs Abs: 1.1 K/uL (ref 0.7–4.0)
MCHC: 30.9 g/dL (ref 30.0–36.0)
MCV: 75.4 fl — ABNORMAL LOW (ref 78.0–100.0)
Monocytes Absolute: 0.4 K/uL (ref 0.1–1.0)
Monocytes Relative: 2.2 % — ABNORMAL LOW (ref 3.0–12.0)
Neutro Abs: 15.4 K/uL — ABNORMAL HIGH (ref 1.4–7.7)
Neutrophils Relative %: 91.3 % — ABNORMAL HIGH (ref 43.0–77.0)
Platelets: 556 K/uL — ABNORMAL HIGH (ref 150.0–400.0)
RBC: 3.99 Mil/uL (ref 3.87–5.11)
RDW: 20.5 % — ABNORMAL HIGH (ref 11.5–15.5)
WBC: 16.9 K/uL — ABNORMAL HIGH (ref 4.0–10.5)

## 2024-10-05 MED ORDER — TRIAMCINOLONE ACETONIDE 10 MG/ML IJ SUSP
10.0000 mg | Freq: Once | INTRAMUSCULAR | Status: AC
  Administered 2024-10-05: 10 mg via INTRA_ARTICULAR

## 2024-10-05 NOTE — Addendum Note (Signed)
 Encounter addended by: Claudene Augustin NOVAK, RRT on: 10/05/2024 1:43 PM  Actions taken: Flowsheet data copied forward, Flowsheet accepted

## 2024-10-05 NOTE — Patient Instructions (Addendum)
 Start miralax  1 capful daily, adjust dose to response, can take 2 capfuls daily.   Pick up iron  Rx and start taking.   Your provider has requested that you go to the basement level for lab work before leaving today. Press B on the elevator. The lab is located at the first door on the left as you exit the elevator.  _______________________________________________________  If your blood pressure at your visit was 140/90 or greater, please contact your primary care physician to follow up on this.  _______________________________________________________  If you are age 17 or older, your body mass index should be between 23-30. Your Body mass index is 34.61 kg/m. If this is out of the aforementioned range listed, please consider follow up with your Primary Care Provider.  If you are age 49 or younger, your body mass index should be between 19-25. Your Body mass index is 34.61 kg/m. If this is out of the aformentioned range listed, please consider follow up with your Primary Care Provider.   ________________________________________________________  The  GI providers would like to encourage you to use MYCHART to communicate with providers for non-urgent requests or questions.  Due to long hold times on the telephone, sending your provider a message by [facility] may be a faster and more efficient way to get a response.  Please allow 48 business hours for a response.  Please remember that this is for non-urgent requests.  _______________________________________________________  [facility] is using a team-based approach to care.  Your team is made up of your doctor and two to three APPS. Our APPS (Nurse Practitioners and Physician Assistants) work with your physician to ensure care continuity for you. They are fully qualified to address your health concerns and develop a treatment plan. They communicate directly with your gastroenterologist to care for you. Seeing the Advanced  Practice Practitioners on your physician's team can help you by facilitating care more promptly, often allowing for earlier appointments, access to diagnostic testing, procedures, and other specialty referrals.

## 2024-10-06 ENCOUNTER — Encounter: Payer: Self-pay | Admitting: Gastroenterology

## 2024-10-06 ENCOUNTER — Ambulatory Visit: Payer: Self-pay | Admitting: Gastroenterology

## 2024-10-06 ENCOUNTER — Encounter (HOSPITAL_COMMUNITY)

## 2024-10-06 NOTE — Progress Notes (Signed)
 ____________________________________________________________  Attending physician addendum:  Thank you for sending this case to me. I have reviewed the entire note and agree with the plan.   Victory Brand, MD  ____________________________________________________________

## 2024-10-07 NOTE — Progress Notes (Signed)
 Subjective:   Patient ID: [name], female   DOB: 63 y.o.   MRN: [id number]   HPI Patient presents with severe plantar fasciitis that is just developed over the last month and she did very well with the previously   ROS      Objective:  Physical Exam  Acute discomfort medial fascial band of the heel at insertion bilaterally with patient having history of this condition but under relative control     Assessment:  Acute flareup plantar fasciitis bilateral     Plan:  H&P reviewed sterile prep injected the fascia at insertion 3 mg Kenalog  5 mg Xylocaine  and this can be done periodically signed visit  Precautionary x-rays taken today indicated depression of the arch bilateral small spur formation no indication of acute stress fracture or arthritis

## 2024-10-08 ENCOUNTER — Ambulatory Visit: Admitting: Allergy & Immunology

## 2024-10-08 ENCOUNTER — Ambulatory Visit (HOSPITAL_COMMUNITY)

## 2024-10-08 VITALS — BP 132/86 | HR 76 | Temp 97.4°F | Ht 63.0 in | Wt 188.0 lb

## 2024-10-08 DIAGNOSIS — D509 Iron deficiency anemia, unspecified: Secondary | ICD-10-CM | POA: Diagnosis not present

## 2024-10-08 DIAGNOSIS — J3089 Other allergic rhinitis: Secondary | ICD-10-CM | POA: Diagnosis not present

## 2024-10-08 DIAGNOSIS — K219 Gastro-esophageal reflux disease without esophagitis: Secondary | ICD-10-CM | POA: Diagnosis not present

## 2024-10-08 DIAGNOSIS — J441 Chronic obstructive pulmonary disease with (acute) exacerbation: Secondary | ICD-10-CM

## 2024-10-08 DIAGNOSIS — F319 Bipolar disorder, unspecified: Secondary | ICD-10-CM | POA: Diagnosis not present

## 2024-10-08 DIAGNOSIS — J4540 Moderate persistent asthma, uncomplicated: Secondary | ICD-10-CM | POA: Diagnosis not present

## 2024-10-08 DIAGNOSIS — L508 Other urticaria: Secondary | ICD-10-CM

## 2024-10-08 DIAGNOSIS — J449 Chronic obstructive pulmonary disease, unspecified: Secondary | ICD-10-CM | POA: Diagnosis not present

## 2024-10-08 DIAGNOSIS — G4733 Obstructive sleep apnea (adult) (pediatric): Secondary | ICD-10-CM | POA: Diagnosis not present

## 2024-10-08 DIAGNOSIS — R5383 Other fatigue: Secondary | ICD-10-CM | POA: Diagnosis not present

## 2024-10-08 NOTE — Patient Instructions (Addendum)
 1. Asthma-COPD overlap syndrome - Lung testing not done today.  - We can check on that next time.  - I will let Pulmonology know that we saw you.  - Restart that aizthromycin three times weekly.  - Daily controller medication(s): Breztri  two puffs twice daily + Ohtuvayre  nebulizer twice daily + Fasenra  every every 8 weeks (after 3 doses) + azithromycin  three times weekly until we see you again (Mon/Wed/Fri). - Prior to physical activity: Ventolin  (GRAY INHALER) 2 puffs 10-15 minutes before physical activity. - Rescue medications: albuterol  nebulizer one vial every 4-6 hours as needed OR Ventolin  (GRAY INHALER) 2 puffs AND 2 puffs Atrovent  (GREEN INHALER) every 4-6 hours as needed - Asthma control goals:  * Full participation in all desired activities (may need albuterol  before activity) * Albuterol  use two time or less a week on average (not counting use with activity) * Cough interfering with sleep two time or less a month * Oral steroids no more than once a year * No hospitalizations  2. Chronic urticaria - under good control - Continue with levocetirizine twice daily EVERY DAY.  - Continue with famotidine  twice daily EVERY DAY.  - Continue with Doxepin  25mg  at night to help with itching AS NEEDED.  - Continue with triamcinolone  twice daily as needed for your lesion.   3. 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 - Continue to hold the allergy  shots.   4. GERD - Continue famotidine  daily . - Continue omeprazole  daily.  5. Return in about 6 weeks (around 11/19/2024). You can have the follow up appointment with Dr. Iva or a Nurse Practicioner (our Nurse Practitioners are excellent and always have Physician oversight!).    Please inform us  of any Emergency Department visits, hospitalizations, or changes in symptoms. Call us  before going to the ED for breathing or allergy  symptoms since we might be able to  fit you in for a sick visit. Feel free to contact us  anytime with any questions, problems, or concerns.  It was a pleasure to see you again today!   Websites that have reliable patient information: 1. American Academy of Asthma, Allergy , and Immunology: www.aaaai.org 2. Food Allergy  Research and Education (FARE): foodallergy.org 3. Mothers of Asthmatics: http://www.asthmacommunitynetwork.org 4. [employer] of Allergy , Asthma, and Immunology: www.acaai.org      "Like" us  on Facebook and Instagram for our latest updates!      A healthy democracy works best when [employer] participate! Make sure you are registered to vote! If you have moved or changed any of your contact information, you will need to get this updated before voting! Scan the QR codes below to learn more!       Chest CT (April 2025):  Cardiovascular: Aortic atherosclerosis. Tortuous thoracic aorta. Normal heart size, without pericardial effusion.   Mediastinum/Nodes: No mediastinal or hilar adenopathy, given limitations of unenhanced CT.   Lungs/Pleura: No pleural fluid. Moderate centrilobular emphysema. Minimal motion degradation inferiorly. Isolated right upper lobe calcified granuloma.   Upper Abdomen: Previously questioned findings of cirrhosis are not appreciated today. Normal imaged portions of the spleen, stomach, adrenal glands, kidneys.   Musculoskeletal: Cervical spine fixation.   IMPRESSION: Lung-RADS 1, negative. Continue annual screening with low-dose chest CT without contrast in 12 months.

## 2024-10-08 NOTE — Progress Notes (Unsigned)
 FOLLOW UP  Date of Service/Encounter:  10/08/24   Assessment:   No diagnosis found.  Plan/Recommendations:   There are no Patient Instructions on file for this visit.   Subjective:   [name] is a 63 y.o. female presenting today for follow up of No chief complaint on file.   Neville Kermit Hitz has a history of the following: Patient Active Problem List   Diagnosis Date Noted   Melena 10/01/2024   GI bleeding 09/30/2024   Bilateral plantar fasciitis 09/30/2024   Intra-abdominal and pelvic swelling, mass and lump, unspecified site 08/26/2024   Coronary artery calcification 07/22/2024   Thrombocytosis 05/25/2024   Symptomatic anemia 05/25/2024   Exertional dyspnea 04/21/2024   Vaginal irritation 03/13/2024   Right ear pain 03/06/2024   Excessive daytime sleepiness 01/23/2024   Upper airway resistance syndrome 01/23/2024   Wheezing 12/02/2023   Chronic cough 10/31/2023   Peri-rectal abscess 10/13/2023   Stridor 10/09/2023   Nocturia 09/17/2023   Steroid-induced psychosis, 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   Hypokalemia 08/12/2023   Hyperglycemia 08/12/2023   History of seizures 08/12/2023   Obesity (BMI 30-39.9) 08/12/2023   Transaminitis 08/12/2023   COVID-19 virus infection 08/07/2023   Peripheral edema 06/18/2023   Cirrhosis (HCC) 03/07/2023   Vocal cord dysfunction 03/05/2023   Urge urinary incontinence 01/25/2023   Influenza A 01/11/2023   Bilateral impacted cerumen 12/04/2022   Not well controlled severe persistent asthma (HCC) 10/25/2022   Abnormal TSH 09/13/2022   Chronic sinusitis 08/14/2022   PND (paroxysmal nocturnal dyspnea) 06/07/2022   Vitamin D  deficiency 05/20/2022   Eczema 05/18/2022   Insomnia 05/18/2022   Allergic conjunctivitis 05/18/2022   Chronic respiratory failure with hypoxia and hypercapnia (HCC) 04/30/2022   Bilateral hearing loss 04/14/2022   Moderate persistent asthma without complication 02/02/2022    Low back pain 10/11/2021   Abnormal CXR 07/17/2021   Aortic atherosclerosis 07/05/2021   URI (upper respiratory infection) 07/05/2021   Arthrodesis status 06/20/2021   Drug allergy  05/14/2021   Acute cough 05/14/2021   Bilateral leg weakness 04/17/2021   Status post cervical spinal fusion 12/27/2020   History of stroke 12/11/2020   Medication management 10/21/2020   Body mass index (BMI) 26.0-26.9, adult 08/23/2020   Healthcare maintenance 08/11/2020   Cervical spondylosis with myelopathy and radiculopathy 07/28/2020   Preop exam for internal medicine 06/19/2020   Hypotension due to medication 06/11/2020   Other spondylosis with myelopathy, cervical region 02/16/2020   Cervical spondylosis 02/02/2020   Radiculopathy, cervical region 02/02/2020   Spondylolysis, cervical region 02/02/2020   Neck pain 02/02/2020   Leg cramping 08/12/2019   Laryngopharyngeal reflux (LPR) 05/26/2019   Sinus pressure 05/26/2019   Hypophosphatemia 01/07/2019   Vertigo 09/23/2018   Dysfunction of left eustachian tube 09/23/2018   Gait disorder 04/21/2018   Leukocytosis 04/15/2018   Nausea & vomiting 04/15/2018   Seizure disorder (HCC)    Prolapsed cervical intervertebral disc 03/11/2018   Cervical radiculopathy 02/18/2018   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   Syncope 09/04/2017   Constipation 09/04/2017   Nonintractable headache 09/04/2017   Leg swelling 07/26/2017   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   Hyperlipidemia 04/20/2017   Pedal edema 04/19/2017   Bipolar disorder (HCC) 09/19/2016   Facial pain 07/12/2016   Rash 06/14/2016   Migraine 01/25/2016   Idiopathic urticaria/pruritus 01/23/2016   Allergic rhinitis 01/23/2016   Oral candidiasis 01/23/2016   Greater trochanteric bursitis of both hips 09/08/2015   Chest pain 06/22/2015   Itching 06/22/2015  Bilateral knee pain 06/22/2015   Bilateral hip pain 06/22/2015   Iron  deficiency anemia due to chronic blood loss 12/22/2014    Fatigue 12/21/2014   Rash and nonspecific skin eruption 08/24/2014   Intertrigo 08/07/2014   Dyspareunia in female 07/12/2014   Menopausal syndrome (hot flushes) 07/12/2014   Dizziness 06/22/2014   Weakness 06/22/2014   Fibromyalgia 05/14/2014   Encounter for well adult exam with abnormal findings 04/22/2014   Recurrent boils 04/22/2014   Recurrent falls 04/22/2014   Peripheral vascular disease    Depression    Anxiety    GERD without esophagitis    Prediabetes 02/23/2014   COPD exacerbation (HCC) 02/23/2014   Encounter for screening mammogram for high-risk patient 02/23/2014   Back pain 07/22/2013   COPD/asthma 08/24/2009   Headache(784.0) 08/24/2009   HTN (hypertension) 05/12/2009   Asthma-COPD overlap syndrome (HCC) 05/12/2009    History obtained from: chart review and {Persons; PED relatives w/patient:19415::patient}.  Discussed the use of AI scribe software for clinical note transcription with the patient and/or guardian, who gave verbal consent to proceed.  [name] is a 63 y.o. female presenting for {Blank single:19197::a food challenge,a drug challenge,skin testing,a sick visit,an evaluation of ***,a follow up visit}.  She was last seen in September 2025 by Wanda Craze, one of our nurse practitioners.  At that time, she was started on a prednisone  taper and encouraged to go to the emergency room if needed.  She continue on Breztri  2 puffs twice daily as well as Ohtuvayre  twice daily and Fasenra  every 8 weeks.  She also remains on azithromycin  3 times weekly.  For her urticaria, we started Xyzal  twice daily every day as well as famotidine  twice daily and doxepin  as needed.  For her mixed rhinitis, we continued with Flonase  and help her allergy  shots.  She remained on famotidine  and omeprazole  to control her GERD.  In the interim, she was  Asthma/Respiratory Symptom History: ***  Echocardiogram 05/28/2024 1. Left ventricular ejection fraction, by estimation,  is 55 to 60% . The left ventricle has normal function. The left ventricle has no regional wall motion abnormalities. Left ventricular diastolic parameters were normal.  2. Right ventricular systolic function is normal. The right ventricular size is normal.  3. The mitral valve is normal in structure. No evidence of mitral valve regurgitation.  4. The aortic valve has an indeterminant number of cusps. There is mild calcification of the aortic valve. Aortic valve regurgitation is not visualized.  5. The inferior vena cava is normal in size with greater than 50% respiratory variability, suggesting right atrial pressure of 3 mmHg.    Allergic Rhinitis Symptom History: ***  Food Allergy  Symptom History: ***  Skin Symptom History: ***  GERD Symptom History: She did undergo a colonoscopy and endoscopy on October 15.  This was for her iron  deficiency workup.  She was continued on IV iron  and Protonix  daily.  She followed up with gastroenterology on October 20 a few days ago.  She had a hemoglobin of 6 when she was in the hospital.  She received 2 units of red blood cells.  She was scheduled for a video endoscopy.  She started on MiraLAX  1 capful daily for constipation.  EGD 09/30/2024 - Z- line regular, 38 cm from the incisors.  - No gross lesions in the entire esophagus.  - Normal stomach.  - Normal examined duodenum. - No specimens collected   MRI abdomen 09/19/2024 No suspicious hepatic lesion. Specifically no lesion in the lateral left  lobe of the liver correspond with findings on prior CT.   Colonoscopy 06/09/2021 - The examined portion of the ileum was normal.  - Small lipoma in the proximal ascending colon.  - Diverticulosis in the proximal ascending colon.  - Redundant colon.  - Internal hemorrhoids.  - The examination was otherwise normal on direct and retroflexion views.  - No specimens collected. - Recall 5 years due to family history of colon cancer  Infection Symptom History:  ***  Otherwise, there have been no changes to her past medical history, surgical history, family history, or social history.    Review of systems otherwise negative other than that mentioned in the HPI.    Objective:   Height 5' 3 (1.6 m), weight 188 lb (85.3 kg). Body mass index is 33.3 kg/m.    Physical Exam   Diagnostic studies: {Blank single:19197::none,deferred due to recent antihistamine use,deferred due to insurance stipulations that require a separate visit for testing,labs sent instead, }  Spirometry: {Blank single:19197::results normal (FEV1: ***%, FVC: ***%, FEV1/FVC: ***%),results abnormal (FEV1: ***%, FVC: ***%, FEV1/FVC: ***%)}.    {Blank single:19197::Spirometry consistent with mild obstructive disease,Spirometry consistent with moderate obstructive disease,Spirometry consistent with severe obstructive disease,Spirometry consistent with possible restrictive disease,Spirometry consistent with mixed obstructive and restrictive disease,Spirometry uninterpretable due to technique,Spirometry consistent with normal pattern}. {Blank single:19197::Albuterol /Atrovent  nebulizer,Xopenex /Atrovent  nebulizer,Albuterol  nebulizer,Albuterol  four puffs via MDI,Xopenex  four puffs via MDI} treatment given in clinic with {Blank single:19197::significant improvement in FEV1 per ATS criteria,significant improvement in FVC per ATS criteria,significant improvement in FEV1 and FVC per ATS criteria,improvement in FEV1, but not significant per ATS criteria,improvement in FVC, but not significant per ATS criteria,improvement in FEV1 and FVC, but not significant per ATS criteria,no improvement}.  Allergy  Studies: {Blank single:19197::none,deferred due to recent antihistamine use,deferred due to insurance stipulations that require a separate visit for testing,labs sent instead, }    {Blank single:19197::Allergy  testing results were read  and interpreted by myself, documented by clinical staff., }      Marty Shaggy, MD  [facility] 

## 2024-10-09 ENCOUNTER — Ambulatory Visit: Admitting: Internal Medicine

## 2024-10-09 ENCOUNTER — Ambulatory Visit: Payer: Self-pay | Admitting: Internal Medicine

## 2024-10-09 ENCOUNTER — Telehealth: Payer: Self-pay

## 2024-10-09 ENCOUNTER — Encounter: Payer: Self-pay | Admitting: Internal Medicine

## 2024-10-09 VITALS — BP 120/78 | HR 81 | Temp 98.3°F | Ht 63.0 in | Wt 187.0 lb

## 2024-10-09 DIAGNOSIS — E559 Vitamin D deficiency, unspecified: Secondary | ICD-10-CM

## 2024-10-09 DIAGNOSIS — D500 Iron deficiency anemia secondary to blood loss (chronic): Secondary | ICD-10-CM | POA: Diagnosis not present

## 2024-10-09 DIAGNOSIS — G4733 Obstructive sleep apnea (adult) (pediatric): Secondary | ICD-10-CM | POA: Diagnosis not present

## 2024-10-09 DIAGNOSIS — D649 Anemia, unspecified: Secondary | ICD-10-CM

## 2024-10-09 DIAGNOSIS — R7303 Prediabetes: Secondary | ICD-10-CM

## 2024-10-09 DIAGNOSIS — F319 Bipolar disorder, unspecified: Secondary | ICD-10-CM | POA: Diagnosis not present

## 2024-10-09 DIAGNOSIS — D509 Iron deficiency anemia, unspecified: Secondary | ICD-10-CM | POA: Diagnosis not present

## 2024-10-09 DIAGNOSIS — J441 Chronic obstructive pulmonary disease with (acute) exacerbation: Secondary | ICD-10-CM

## 2024-10-09 DIAGNOSIS — J449 Chronic obstructive pulmonary disease, unspecified: Secondary | ICD-10-CM | POA: Diagnosis not present

## 2024-10-09 DIAGNOSIS — J4540 Moderate persistent asthma, uncomplicated: Secondary | ICD-10-CM | POA: Diagnosis not present

## 2024-10-09 DIAGNOSIS — R5383 Other fatigue: Secondary | ICD-10-CM | POA: Diagnosis not present

## 2024-10-09 LAB — CBC WITH DIFFERENTIAL/PLATELET
Basophils Absolute: 0 K/uL (ref 0.0–0.1)
Basophils Relative: 0.1 % (ref 0.0–3.0)
Eosinophils Absolute: 0 K/uL (ref 0.0–0.7)
Eosinophils Relative: 0 % (ref 0.0–5.0)
HCT: 32.1 % — ABNORMAL LOW (ref 36.0–46.0)
Hemoglobin: 9.8 g/dL — ABNORMAL LOW (ref 12.0–15.0)
Lymphocytes Relative: 7.1 % — ABNORMAL LOW (ref 12.0–46.0)
Lymphs Abs: 1.4 K/uL (ref 0.7–4.0)
MCHC: 30.5 g/dL (ref 30.0–36.0)
MCV: 76.6 fl — ABNORMAL LOW (ref 78.0–100.0)
Monocytes Absolute: 1 K/uL (ref 0.1–1.0)
Monocytes Relative: 5 % (ref 3.0–12.0)
Neutro Abs: 17.9 K/uL — ABNORMAL HIGH (ref 1.4–7.7)
Neutrophils Relative %: 87.8 % — ABNORMAL HIGH (ref 43.0–77.0)
Platelets: 590 K/uL — ABNORMAL HIGH (ref 150.0–400.0)
RBC: 4.19 Mil/uL (ref 3.87–5.11)
RDW: 21.9 % — ABNORMAL HIGH (ref 11.5–15.5)
WBC: 20.4 K/uL (ref 4.0–10.5)

## 2024-10-09 LAB — BASIC METABOLIC PANEL WITH GFR
BUN: 17 mg/dL (ref 6–23)
CO2: 28 meq/L (ref 19–32)
Calcium: 9 mg/dL (ref 8.4–10.5)
Chloride: 103 meq/L (ref 96–112)
Creatinine, Ser: 0.74 mg/dL (ref 0.40–1.20)
GFR: 87.09 mL/min (ref >60.00)
Glucose, Bld: 130 mg/dL — ABNORMAL HIGH (ref 70–99)
Potassium: 4.2 meq/L (ref 3.5–5.1)
Sodium: 141 meq/L (ref 135–145)

## 2024-10-09 LAB — HEPATIC FUNCTION PANEL
ALT: 14 U/L (ref 0–35)
AST: 14 U/L (ref 0–37)
Albumin: 4.2 g/dL (ref 3.5–5.2)
Alkaline Phosphatase: 106 U/L (ref 39–117)
Bilirubin, Direct: 0.1 mg/dL (ref 0.0–0.3)
Total Bilirubin: 0.5 mg/dL (ref 0.2–1.2)
Total Protein: 7 g/dL (ref 6.0–8.3)

## 2024-10-09 NOTE — Telephone Encounter (Signed)
 Pt states understanding no further questions at this time.

## 2024-10-09 NOTE — Telephone Encounter (Signed)
 Ok to contact pt  The elevated WBC is most likely due to recent steroids  But pt should go to ED for any sign of illness or fever

## 2024-10-09 NOTE — Progress Notes (Signed)
 Patient ID: [name], female   DOB: 1961/04/28, 63 y.o.   MRN: [id number]        Chief Complaint: follow up post hospn oct 15 - 16 2025       HPI:  [name] is a 63 y.o. female here with above, found to have significant iron  deficiency anemia after initial Hgb 6.6 with heme pos stool, s/p transfusion and last Hgb 9.3.  No anticoagulant use.  No other bruising bleeding.  No other overt bleeding. Seen by GI  EGD oct 16 with no gross lesions, with recommendation for protonix  40 every day, IV iron , avoid nsaids, and f/u GI for capsule endoscopy  Feels improved stamina but still fatigued.  Pt denies chest pain, increased sob or doe, wheezing, orthopnea, PND, increased LE swelling, palpitations, dizziness or syncope.   Pt denies polydipsia, polyuria, or new focal neuro s/s.          Wt Readings from Last 3 Encounters:  10/09/24 187 lb (84.8 kg)  10/08/24 188 lb (85.3 kg)  10/05/24 189 lb 4 oz (85.8 kg)   BP Readings from Last 3 Encounters:  10/09/24 120/78  10/08/24 132/86  10/05/24 (!) 114/58         Past Medical History:  Diagnosis Date   Allergy     Anemia, iron  deficiency 12/22/2014   pt. 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      Asthma    Bipolar disorder (HCC)    with anxiety, depression   COPD (chronic obstructive pulmonary disease) (HCC)    Depression    Eczema 05/18/2022   Emphysema of lung (HCC)    Fibromyalgia 05/14/2014   GERD (gastroesophageal reflux disease)    History of kidney stones    Hyperlipidemia 04/20/2017   HYPERTENSION 05/12/2009   Qualifier: Diagnosis of  By: Primus LATHER LEODIS), [name]     Osteoporosis    Peripheral vascular disease    Pneumonia    Prediabetes 02/23/2014   pt. denies   Seizure (HCC)    Sleep apnea     Stroke (HCC) 11/2020   Urticaria    Vitamin D  deficiency    Past Surgical History:  Procedure Laterality Date   ABDOMINAL HYSTERECTOMY     ANTERIOR CERVICAL DECOMP/DISCECTOMY FUSION N/A 07/28/2020   Procedure: ANTERIOR CERVICAL DECOMPRESSION/DISCECTOMY FUSION. INTERBODY PROTHESIS, PLATE/SCREWS CERVICAL THREE- CERVICAL FOUR, CERVICAL FOUR- CERVICAL FIVE;  Surgeon: Mavis Purchase, MD;  Location: [facility] OR;  Service: Neurosurgery;  Laterality: N/A;   BACK SURGERY     COLONOSCOPY  12/20/2011   Procedure: COLONOSCOPY;  Surgeon: Elsie CHRISTELLA Cree, MD;  Location: [facility] ENDOSCOPY;  Service: Endoscopy;  Laterality: N/A;   COLONOSCOPY  03/05/2012   Procedure: COLONOSCOPY;  Surgeon: Elsie CHRISTELLA Cree, MD;  Location: [facility] ENDOSCOPY;  Service: Endoscopy;  Laterality: N/A;   DIAGNOSTIC LAPAROSCOPY     ESOPHAGOGASTRODUODENOSCOPY N/A 10/01/2024   Procedure: EGD (ESOPHAGOGASTRODUODENOSCOPY);  Surgeon: Nandigam, Kavitha V, MD;  Location: [facility] ENDOSCOPY;  Service: Gastroenterology;  Laterality: N/A;   ESOPHAGOGASTRODUODENOSCOPY (EGD) WITH PROPOFOL  N/A 10/31/2023   Procedure: ESOPHAGOGASTRODUODENOSCOPY (EGD) WITH PROPOFOL ;  Surgeon: Legrand Victory LITTIE DOUGLAS, MD;  Location: [facility] ENDOSCOPY;  Service: Gastroenterology;  Laterality: N/A;   HEMORRHOID SURGERY     INCISE AND DRAIN ABCESS     KIDNEY STONE SURGERY  NECK SURGERY     x 2 Dr JINNY Budge   SPINE SURGERY  2019   TOE SURGERY     TUBAL LIGATION      reports that she quit smoking about 6 years ago. Her smoking use included cigarettes. She started smoking about 46 years ago. She has a 20 pack-year smoking history. She has never been exposed to tobacco smoke. She has never used smokeless tobacco. She reports that she does not currently use alcohol . She reports that she does not currently use drugs after having used the following drugs: Marijuana. family history includes Alcohol  abuse in her brother and father; Anemia in her daughter, sister, and sister; Anesthesia problems in  her father; Asthma in her brother, brother, mother, and sister; Brain cancer in her sister; Breast cancer (age of onset: 68) in her sister; COPD in her brother, mother, and sister; Colon cancer (age of onset: 50) in her father; Depression in her brother; Diabetes in her brother, father, mother, sister, sister, sister, and sister; Heart disease in her brother, brother, mother, and sister; Hypertension in her brother, brother, daughter, sister, sister, sister, sister, and sister; Kidney disease in her father; Sleep apnea in her brother. Allergies  Allergen Reactions   Sulfa Antibiotics Nausea And Vomiting and Other (See Comments)   Venofer  [Iron  Sucrose] Hives   Current Outpatient Medications on File Prior to Visit  Medication Sig Dispense Refill   AIRSUPRA  90-80 MCG/ACT AERO SMARTSIG:2 Puff(s) Via Inhaler 4 Times Daily PRN     albuterol  (PROVENTIL ) (2.5 MG/3ML) 0.083% nebulizer solution USE 1 VIAL VIA NEBULIZER EVERY 4 HOURS AS NEEDED FOR WHEEZING OR SHORTNESS OF BREATH 75 mL 1   albuterol  (VENTOLIN  HFA) 108 (90 Base) MCG/ACT inhaler INHALE 2 PUFFS INTO THE LUNGS EVERY 6 HOURS AS NEEDED FOR WHEEZING OR SHORTNESS OF BREATH 18 g 3   amLODipine  (NORVASC ) 5 MG tablet Take 1 tablet (5 mg total) by mouth daily. 90 tablet 3   atorvastatin  (LIPITOR) 80 MG tablet Take 1 tablet (80 mg total) by mouth daily. 90 tablet 1   azelastine  (OPTIVAR ) 0.05 % ophthalmic solution Place 1 drop into both eyes 2 (two) times daily. 6 mL 12   BREZTRI  AEROSPHERE 160-9-4.8 MCG/ACT AERO inhaler INHALE 2 PUFFS INTO THE LUNGS IN THE MORNING AND AT BEDTIME 10.7 g 11   budesonide  (PULMICORT ) 0.5 MG/2ML nebulizer solution USE 2 ML(0.5 MG) VIA NEBULIZER IN THE MORNING AND AT BEDTIME 120 mL 2   carbamazepine  (TEGRETOL ) 200 MG tablet Take 2-3 tablets (400-600 mg total) by mouth See admin instructions. Take 400mg  in the morning and 600mg  at bedtime. 150 tablet 2   cetirizine  (ZYRTEC ) 10 MG tablet Take 10 mg by mouth daily.     cromolyn   (OPTICROM ) 4 % ophthalmic solution Place 1 drop into both eyes 4 (four) times daily. 10 mL 12   doxepin  (SINEQUAN ) 25 MG capsule TAKE 1 CAPSULE(25 MG) BY MOUTH AT BEDTIME AS NEEDED 30 capsule 5   Ensifentrine  (OHTUVAYRE ) 3 MG/2.5ML SUSP Inhale 3 mg into the lungs in the morning and at bedtime. 150 mL 11   estradiol  (ESTRACE ) 0.1 MG/GM vaginal cream Place 1g nightly 2-3 times a week 30 g 3   famotidine  (PEPCID ) 40 MG tablet Take 1 tablet (40 mg total) by mouth 2 (two) times daily. 60 tablet 5   ferrous sulfate  325 (65 FE) MG EC tablet Take 1 tablet (325 mg total) by mouth daily with breakfast. 30 tablet 1   fluticasone  (FLONASE ) 50 MCG/ACT  nasal spray SHAKE LIQUID AND USE 1 SPRAY IN EACH NOSTRIL TWICE DAILY AS NEEDED FOR ALLERGIES OR RHINITIS 16 g 5   formoterol  (PERFOROMIST ) 20 MCG/2ML nebulizer solution Take 20 mcg by nebulization 2 (two) times daily.     furosemide  (LASIX ) 20 MG tablet TAKE 1 TABLET BY MOUTH DAILY AS NEEDED 30 tablet 5   hydrOXYzine  (VISTARIL ) 25 MG capsule Take 1 capsule (25 mg total) by mouth at bedtime as needed for itching.     ipratropium (ATROVENT  HFA) 17 MCG/ACT inhaler Inhale 2 puffs into the lungs every 4 (four) hours as needed for wheezing. 1 each 5   irbesartan  (AVAPRO ) 150 MG tablet Take 1 tablet (150 mg total) by mouth daily. 90 tablet 3   levocetirizine (XYZAL ) 5 MG tablet Take 1 tablet (5 mg total) by mouth every evening.     meclizine  (ANTIVERT ) 12.5 MG tablet Take 12.5 mg by mouth 3 (three) times daily as needed for dizziness.     montelukast  (SINGULAIR ) 10 MG tablet TAKE 1 TABLET(10 MG) BY MOUTH AT BEDTIME 90 tablet 1   omeprazole  (PRILOSEC) 40 MG capsule Take 1 capsule (40 mg total) by mouth daily. 90 capsule 3   ondansetron  (ZOFRAN -ODT) 4 MG disintegrating tablet DISSOLVE 1 TABLET(4 MG) ON THE TONGUE EVERY 8 HOURS AS NEEDED FOR NAUSEA OR VOMITING 30 tablet 1   pantoprazole  (PROTONIX ) 40 MG tablet Take 40 mg by mouth daily.     predniSONE  (DELTASONE ) 10 MG  tablet 3 tabs by mouth per day for 3 days,2tabs per day for 3 days,1tab per day for 3 days 18 tablet 0   Suvorexant  (BELSOMRA ) 15 MG TABS Take by mouth.     tacrolimus  (PROTOPIC ) 0.03 % ointment Apply 1 Application topically 2 (two) times daily as needed (rash). 60 g 5   triamcinolone  ointment (KENALOG ) 0.1 % Apply 1 Application topically at bedtime. 30 g 5   Vibegron  (GEMTESA ) 75 MG TABS Take 1 tablet (75 mg total) by mouth daily. 30 tablet 11   Current Facility-Administered Medications on File Prior to Visit  Medication Dose Route Frequency Provider Last Rate Last Admin   benralizumab  (FASENRA ) prefilled syringe 30 mg  30 mg Subcutaneous Q8 Weeks Gallagher, Joel Louis, MD   30 mg at 09/02/24 0936        ROS:  All others reviewed and negative.  Objective        PE:  BP 120/78 (BP Location: Right Arm, Patient Position: Sitting, Cuff Size: Normal)   Pulse 81   Temp 98.3 F (36.8 C) (Oral)   Ht 5' 3 (1.6 m)   Wt 187 lb (84.8 kg)   SpO2 98%   BMI 33.13 kg/m                 Constitutional: Pt appears in NAD               HENT: Head: NCAT.                Right Ear: External ear normal.                 Left Ear: External ear normal.                Eyes: . Pupils are equal, round, and reactive to light. Conjunctivae and EOM are normal               Nose: without d/c or deformity  Neck: Neck supple. Gross normal ROM               Cardiovascular: Normal rate and regular rhythm.                 Pulmonary/Chest: Effort normal and breath sounds not reduced it seems,  without rales or wheezing.                Abd:  Soft, NT, ND, + BS, no organomegaly               Neurological: Pt is alert. At baseline orientation, motor grossly intact               Skin: Skin is warm. No rashes, no other new lesions, LE edema - none               Psychiatric: Pt behavior is normal without agitation   Micro: none  Cardiac tracings I have personally interpreted today:  none  Pertinent  Radiological findings (summarize): none   Lab Results  Component Value Date   WBC 20.4 Repeated and verified X2. (HH) 10/09/2024   HGB 9.8 (L) 10/09/2024   HCT 32.1 (L) 10/09/2024   PLT 590.0 (H) 10/09/2024   GLUCOSE 130 (H) 10/09/2024   CHOL 171 12/25/2023   TRIG 64.0 12/25/2023   HDL 77.50 12/25/2023   LDLCALC 80 12/25/2023   ALT 14 10/09/2024   AST 14 10/09/2024   NA 141 10/09/2024   K 4.2 10/09/2024   CL 103 10/09/2024   CREATININE 0.74 10/09/2024   BUN 17 10/09/2024   CO2 28 10/09/2024   TSH 0.53 12/25/2023   INR 0.9 03/14/2023   HGBA1C 6.0 12/25/2023   Assessment/Plan:  [name] is a 63 y.o. Black or African American [2] female with  has a past medical history of Allergy , Anemia, iron  deficiency (12/22/2014), Anxiety, Arthritis, ASTHMA (05/12/2009), Asthma, Bipolar disorder (HCC), COPD (chronic obstructive pulmonary disease) (HCC), Depression, Eczema (05/18/2022), Emphysema of lung (HCC), Fibromyalgia (05/14/2014), GERD (gastroesophageal reflux disease), History of kidney stones, Hyperlipidemia (04/20/2017), HYPERTENSION (05/12/2009), Osteoporosis, Peripheral vascular disease, Pneumonia, Prediabetes (02/23/2014), Seizure (HCC), Sleep apnea, Stroke (HCC) (11/2020), Urticaria, and Vitamin D  deficiency.  Iron  deficiency anemia due to chronic blood loss Pt for f/u cbc today, also to f/u GI with planned oct 28 capsule endoscopy  COPD/asthma Stable ,cont current inhaler tx  Vitamin D  deficiency Last vitamin D  Lab Results  Component Value Date   VD25OH 36.15 12/25/2023   Low, to start oral replacement   Prediabetes Lab Results  Component Value Date   HGBA1C 6.0 12/25/2023   Stable, pt to continue current medical treatment  - diet, wt control  Followup: Return in about 6 months (around 04/09/2025).  Lynwood Rush, MD 10/12/2024 10:48 AM [facility] - [facility]

## 2024-10-09 NOTE — Patient Instructions (Signed)
 Please continue all other medications as before, and refills have been done if requested.  Please have the pharmacy call with any other refills you may need.  Please continue your efforts at being more active, low cholesterol diet, and weight control.  Please keep your appointments with your specialists as you may have planned - GI and Pulmonary  Please go to the LAB at the blood drawing area for the tests to be done  You will be contacted by phone if any changes need to be made immediately.  Otherwise, you will receive a letter about your results with an explanation, but please check with MyChart first.  Please make an Appointment to return in 6 months, or sooner if needed

## 2024-10-10 DIAGNOSIS — D509 Iron deficiency anemia, unspecified: Secondary | ICD-10-CM | POA: Diagnosis not present

## 2024-10-10 DIAGNOSIS — J449 Chronic obstructive pulmonary disease, unspecified: Secondary | ICD-10-CM | POA: Diagnosis not present

## 2024-10-10 DIAGNOSIS — J4540 Moderate persistent asthma, uncomplicated: Secondary | ICD-10-CM | POA: Diagnosis not present

## 2024-10-10 DIAGNOSIS — R5383 Other fatigue: Secondary | ICD-10-CM | POA: Diagnosis not present

## 2024-10-10 DIAGNOSIS — F319 Bipolar disorder, unspecified: Secondary | ICD-10-CM | POA: Diagnosis not present

## 2024-10-10 DIAGNOSIS — G4733 Obstructive sleep apnea (adult) (pediatric): Secondary | ICD-10-CM | POA: Diagnosis not present

## 2024-10-12 ENCOUNTER — Other Ambulatory Visit: Payer: Self-pay | Admitting: Obstetrics

## 2024-10-12 ENCOUNTER — Telehealth: Payer: Self-pay | Admitting: *Deleted

## 2024-10-12 ENCOUNTER — Encounter: Payer: Self-pay | Admitting: Internal Medicine

## 2024-10-12 DIAGNOSIS — G4730 Sleep apnea, unspecified: Secondary | ICD-10-CM

## 2024-10-12 DIAGNOSIS — N3941 Urge incontinence: Secondary | ICD-10-CM

## 2024-10-12 DIAGNOSIS — N941 Unspecified dyspareunia: Secondary | ICD-10-CM

## 2024-10-12 DIAGNOSIS — N898 Other specified noninflammatory disorders of vagina: Secondary | ICD-10-CM

## 2024-10-12 NOTE — Telephone Encounter (Signed)
 No, I want them to be on a CPAP at 12cwp. No indication for non-invasive ventilator (NIV) support at this time.

## 2024-10-12 NOTE — Telephone Encounter (Signed)
 Camie Ang, the patient's nurse advocate called to get the status of the CPAP.  She will be calling [facility] to see if she can help.  Her phone number [phone] if you need to to contact her.

## 2024-10-12 NOTE — Telephone Encounter (Signed)
 ATC [facility] with information below. Copied from CRM [id number]. Topic: Clinical - Order For Equipment >> Oct 12, 2024  9:16 AM Isabell A wrote: Reason for CRM:  Nurse advocate Sarah with [facility] states Camelia is requesting an order that clearly states she needs an IV.   Callback number: [phone]   Trudy Warren CROME, CMA    10/12/24 11:55 AM Note Pt has been notified on VM     Trudy Warren CROME, [facility]    10/12/24 11:53 AM Note Terri at [place] notified pt should not be on NIV just to go ahead with the CPAP order as sent      Hope Almarie ORN, NP to Trudy Warren CROME, CMA  [facility]     10/12/24 11:37 AM Note No, I want them to be on a CPAP at 12cwp. No indication for non-invasive ventilator (NIV) support at this time.

## 2024-10-12 NOTE — Assessment & Plan Note (Signed)
 Pt for f/u cbc today, also to f/u GI with planned oct 28 capsule endoscopy

## 2024-10-12 NOTE — Telephone Encounter (Signed)
Pt has been notified on VM

## 2024-10-12 NOTE — Assessment & Plan Note (Signed)
Stable, cont current inhaler tx 

## 2024-10-12 NOTE — Telephone Encounter (Signed)
 Terri at [facility] notified pt should not be on NIV just to go ahead with the CPAP order as sent

## 2024-10-12 NOTE — Assessment & Plan Note (Signed)
 Lab Results  Component Value Date   HGBA1C 6.0 12/25/2023   Stable, pt to continue current medical treatment - diet, wt control

## 2024-10-12 NOTE — Telephone Encounter (Signed)
 This pt has an order for CPAP placed by you in Sept. She still has not received it Im currently speaking with [facility] and they state she previously has a NIV not a CPAP. they are asking for pt to have ABG to see if she still needs NIV not CPAP. Pt has not been using anything as they state they have been trying to get in touch with you about her

## 2024-10-12 NOTE — Assessment & Plan Note (Signed)
 Last vitamin D Lab Results  Component Value Date   VD25OH 36.15 12/25/2023   Low, to start  oral replacement

## 2024-10-13 ENCOUNTER — Encounter

## 2024-10-13 ENCOUNTER — Ambulatory Visit (HOSPITAL_COMMUNITY)

## 2024-10-13 NOTE — Telephone Encounter (Signed)
 Order has been sent to [facility] as Urgent

## 2024-10-13 NOTE — Telephone Encounter (Signed)
 Copied from CRM [id number]. Topic: Clinical - Order For Equipment >> Oct 12, 2024  4:20 PM Rozanna MATSU wrote: Reason for CRM: Lauraine Kato with [facility] states they are needing clarity on the order for the patient. So they need to know if the patient needs CPAP or Bipap and need a new order. Please send to [facility] [phone].  New urgent order placed.  I also copied [name]'s note into the order so they understand the pt should not be on NIV.  Just CPAP is needed.

## 2024-10-13 NOTE — Telephone Encounter (Signed)
 Will send as soon as order is signed

## 2024-10-14 NOTE — Addendum Note (Signed)
 Encounter addended by: Claudene Augustin NOVAK, RRT on: 10/14/2024 10:01 AM  Actions taken: Clinical Note Signed

## 2024-10-14 NOTE — Progress Notes (Signed)
 Pulmonary Individual Treatment Plan  Patient Details  Name: [name] MRN: [id number] Date of Birth: 1961/07/20 Referring Provider:   Conrad Ports Pulmonary Rehab Walk Test from 07/10/2024 in [facility] for Heart, V[facility], & Lung Health  Referring Provider Dr. Fairy Danker ([facility])    Initial Encounter Date:  Flowsheet Row Pulmonary Rehab Walk Test from 07/10/2024 in [facility] for Heart, Vascular, & Lung Health  Date 07/10/24    Visit Diagnosis: Stage 3 severe COPD by GOLD classification (HCC)  Patient's Home Medications on Admission:   Current Outpatient Medications:    AIRSUPRA  90-80 MCG/ACT AERO, SMARTSIG:2 Puff(s) Via Inhaler 4 Times Daily PRN, Disp: , Rfl:    albuterol  (PROVENTIL ) (2.5 MG/3ML) 0.083% nebulizer solution, USE 1 VIAL VIA NEBULIZER EVERY 4 HOURS AS NEEDED FOR WHEEZING OR SHORTNESS OF BREATH, Disp: 75 mL, Rfl: 1   albuterol  (VENTOLIN  HFA) 108 (90 Base) MCG/ACT inhaler, INHALE 2 PUFFS INTO THE LUNGS EVERY 6 HOURS AS NEEDED FOR WHEEZING OR SHORTNESS OF BREATH, Disp: 18 g, Rfl: 3   amLODipine  (NORVASC ) 5 MG tablet, Take 1 tablet (5 mg total) by mouth daily., Disp: 90 tablet, Rfl: 3   atorvastatin  (LIPITOR) 80 MG tablet, Take 1 tablet (80 mg total) by mouth daily., Disp: 90 tablet, Rfl: 1   azelastine  (OPTIVAR ) 0.05 % ophthalmic solution, Place 1 drop into both eyes 2 (two) times daily., Disp: 6 mL, Rfl: 12   BREZTRI  AEROSPHERE 160-9-4.8 MCG/ACT AERO inhaler, INHALE 2 PUFFS INTO THE LUNGS IN THE MORNING AND AT BEDTIME, Disp: 10.7 g, Rfl: 11   budesonide  (PULMICORT ) 0.5 MG/2ML nebulizer solution, USE 2 ML(0.5 MG) VIA NEBULIZER IN THE MORNING AND AT BEDTIME, Disp: 120 mL, Rfl: 2   carbamazepine  (TEGRETOL ) 200 MG tablet, Take 2-3 tablets (400-600 mg total) by mouth See admin instructions. Take 400mg  in the morning and 600mg  at bedtime., Disp: 150 tablet, Rfl: 2   cetirizine  (ZYRTEC ) 10 MG tablet, Take 10 mg by  mouth daily., Disp: , Rfl:    cromolyn  (OPTICROM ) 4 % ophthalmic solution, Place 1 drop into both eyes 4 (four) times daily., Disp: 10 mL, Rfl: 12   doxepin  (SINEQUAN ) 25 MG capsule, TAKE 1 CAPSULE(25 MG) BY MOUTH AT BEDTIME AS NEEDED, Disp: 30 capsule, Rfl: 5   Ensifentrine  (OHTUVAYRE ) 3 MG/2.5ML SUSP, Inhale 3 mg into the lungs in the morning and at bedtime., Disp: 150 mL, Rfl: 11   estradiol  (ESTRACE ) 0.01 % CREA vaginal cream, PLACE 0.5 GRAMS VAGINALLY NIGHTLY FOR 2 WEEKS, THEN TWICE A WEEK AFTER, Disp: 42.5 g, Rfl: 2   famotidine  (PEPCID ) 40 MG tablet, Take 1 tablet (40 mg total) by mouth 2 (two) times daily., Disp: 60 tablet, Rfl: 5   ferrous sulfate  325 (65 FE) MG EC tablet, Take 1 tablet (325 mg total) by mouth daily with breakfast., Disp: 30 tablet, Rfl: 1   fluticasone  (FLONASE ) 50 MCG/ACT nasal spray, SHAKE LIQUID AND USE 1 SPRAY IN EACH NOSTRIL TWICE DAILY AS NEEDED FOR ALLERGIES OR RHINITIS, Disp: 16 g, Rfl: 5   formoterol  (PERFOROMIST ) 20 MCG/2ML nebulizer solution, Take 20 mcg by nebulization 2 (two) times daily., Disp: , Rfl:    furosemide  (LASIX ) 20 MG tablet, TAKE 1 TABLET BY MOUTH DAILY AS NEEDED, Disp: 30 tablet, Rfl: 5   hydrOXYzine  (VISTARIL ) 25 MG capsule, Take 1 capsule (25 mg total) by mouth at bedtime as needed for itching., Disp: , Rfl:    ipratropium (ATROVENT  HFA) 17 MCG/ACT inhaler, Inhale  2 puffs into the lungs every 4 (four) hours as needed for wheezing., Disp: 1 each, Rfl: 5   irbesartan  (AVAPRO ) 150 MG tablet, Take 1 tablet (150 mg total) by mouth daily., Disp: 90 tablet, Rfl: 3   levocetirizine (XYZAL ) 5 MG tablet, Take 1 tablet (5 mg total) by mouth every evening., Disp: , Rfl:    meclizine  (ANTIVERT ) 12.5 MG tablet, Take 12.5 mg by mouth 3 (three) times daily as needed for dizziness., Disp: , Rfl:    montelukast  (SINGULAIR ) 10 MG tablet, TAKE 1 TABLET(10 MG) BY MOUTH AT BEDTIME, Disp: 90 tablet, Rfl: 1   omeprazole  (PRILOSEC) 40 MG capsule, Take 1 capsule (40 mg  total) by mouth daily., Disp: 90 capsule, Rfl: 3   ondansetron  (ZOFRAN -ODT) 4 MG disintegrating tablet, DISSOLVE 1 TABLET(4 MG) ON THE TONGUE EVERY 8 HOURS AS NEEDED FOR NAUSEA OR VOMITING, Disp: 30 tablet, Rfl: 1   pantoprazole  (PROTONIX ) 40 MG tablet, Take 40 mg by mouth daily., Disp: , Rfl:    predniSONE  (DELTASONE ) 10 MG tablet, 3 tabs by mouth per day for 3 days,2tabs per day for 3 days,1tab per day for 3 days, Disp: 18 tablet, Rfl: 0   Suvorexant  (BELSOMRA ) 15 MG TABS, Take by mouth., Disp: , Rfl:    tacrolimus  (PROTOPIC ) 0.03 % ointment, Apply 1 Application topically 2 (two) times daily as needed (rash)., Disp: 60 g, Rfl: 5   triamcinolone  ointment (KENALOG ) 0.1 %, Apply 1 Application topically at bedtime., Disp: 30 g, Rfl: 5   Vibegron  (GEMTESA ) 75 MG TABS, Take 1 tablet (75 mg total) by mouth daily., Disp: 30 tablet, Rfl: 11  Current Facility-Administered Medications:    benralizumab  (FASENRA ) prefilled syringe 30 mg, 30 mg, Subcutaneous, Q8 Weeks, Iva Marty Saltness, MD, 30 mg at 09/02/24 9063  Past Medical History: Past Medical History:  Diagnosis Date   Allergy     Anemia, iron  deficiency 12/22/2014   pt. 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      Asthma    Bipolar disorder (HCC)    with anxiety, depression   COPD (chronic obstructive pulmonary disease) (HCC)    Depression    Eczema 05/18/2022   Emphysema of lung (HCC)    Fibromyalgia 05/14/2014   GERD (gastroesophageal reflux disease)    History of kidney stones    Hyperlipidemia 04/20/2017   HYPERTENSION 05/12/2009   Qualifier: Diagnosis of  By: Primus LATHER LEODIS), [name]     Osteoporosis    Peripheral vascular disease    Pneumonia    Prediabetes 02/23/2014   pt. denies   Seizure (HCC)    Sleep apnea    Stroke  (HCC) 11/2020   Urticaria    Vitamin D  deficiency     Tobacco Use: Social History   Tobacco Use  Smoking Status Former   Current packs/day: 0.00   Average packs/day: 0.5 packs/day for 40.0 years (20.0 ttl pk-yrs)   Types: Cigarettes   Start date: 70   Quit date: 04/2018   Years since quitting: 6.5   Passive exposure: Never  Smokeless Tobacco Never  Tobacco Comments   Don't Smoke    Labs: Review Flowsheet  More data exists      Latest Ref Rng & Units 06/23/2023 08/11/2023 08/12/2023 12/25/2023 09/30/2024  Labs for ITP Cardiac and  Pulmonary Rehab  Cholestrol 0 - 200 mg/dL - - - 828  -  LDL (calc) 0 - 99 mg/dL - - - 80  -  HDL-C >60.99 mg/dL - - - 22.49  -  Trlycerides 0.0 - 149.0 mg/dL - - - 35.9  -  Hemoglobin A1c 4.6 - 6.5 % - - - 6.0  -  Bicarbonate 20.0 - 28.0 mmol/L 34.7  26.2  24.7  - -  TCO2 22 - 32 mmol/L 30  36  27  25  - 25   O2 Saturation % 87  97  98  - -    Details       Multiple values from one day are sorted in reverse-chronological order         Capillary Blood Glucose: Lab Results  Component Value Date   GLUCAP 128 (H) 08/06/2024   GLUCAP 108 (H) 08/20/2023   GLUCAP 99 08/20/2023   GLUCAP 126 (H) 08/19/2023   GLUCAP 103 (H) 08/19/2023     Pulmonary Assessment Scores:  Pulmonary Assessment Scores     Row Name 07/10/24 1145         ADL UCSD   ADL Phase Entry     SOB Score total 88       CAT Score   CAT Score 32       mMRC Score   mMRC Score 3       UCSD: Self-administered rating of dyspnea associated with activities of daily living (ADLs) 6-point scale (0 = not at all to 5 = maximal or unable to do because of breathlessness)  Scoring Scores range from 0 to 120.  Minimally important difference is 5 units  CAT: CAT can identify the health impairment of COPD patients and is better correlated with disease progression.  CAT has a scoring range of zero to 40. The CAT score is classified into four groups of low (less than 10),  medium (10 - 20), high (21-30) and very high (31-40) based on the impact level of disease on health status. A CAT score over 10 suggests significant symptoms.  A worsening CAT score could be explained by an exacerbation, poor medication adherence, poor inhaler technique, or progression of COPD or comorbid conditions.  CAT MCID is 2 points  mMRC: mMRC (Modified Medical Research Council) Dyspnea Scale is used to assess the degree of baseline functional disability in patients of respiratory disease due to dyspnea. No minimal important difference is established. A decrease in score of 1 point or greater is considered a positive change.   Pulmonary Function Assessment:  Pulmonary Function Assessment - 07/10/24 1145       Breath   Bilateral Breath Sounds Clear    Shortness of Breath Yes;Fear of Shortness of Breath;Limiting activity;Panic with Shortness of Breath          Exercise Target Goals: Exercise Program Goal: Individual exercise prescription set using results from initial 6 min walk test and THRR while considering  patient's activity barriers and safety.   Exercise Prescription Goal: Initial exercise prescription builds to 30-45 minutes a day of aerobic activity, 2-3 days per week.  Home exercise guidelines will be given to patient during program as part of exercise prescription that the participant will acknowledge.  Activity Barriers & Risk Stratification:  Activity Barriers & Cardiac Risk Stratification - 07/10/24 1028       Activity Barriers & Cardiac Risk Stratification   Activity Barriers History of Falls;Balance Concerns;Deconditioning;Muscular Weakness;Shortness of Breath;Back Problems;Assistive Device  6 Minute Walk:  6 Minute Walk     Row Name 07/10/24 1130         6 Minute Walk   Phase Initial     Distance 1020 feet     Walk Time 6 minutes     # of Rest Breaks 1  desat to 85%, 2:36-2:44 92% after PLB     MPH 1.93     METS 2.65     RPE 11      Perceived Dyspnea  1     VO2 Peak 9.27     Symptoms Yes (comment)     Comments dyspnea     Resting HR 77 bpm     Resting BP 130/74     Resting Oxygen  Saturation  98 %     Exercise Oxygen  Saturation  during 6 min walk 85 %  O2 dropped to 85%, PLB up to 92%     Max Ex. HR 104 bpm     Max Ex. BP 144/80     2 Minute Post BP 120/76       Interval HR   1 Minute HR 93     2 Minute HR 99     3 Minute HR 101     4 Minute HR 102     5 Minute HR 101     6 Minute HR 104     2 Minute Post HR 72     Interval Heart Rate? Yes       Interval Oxygen    Interval Oxygen ? Yes     Baseline Oxygen  Saturation % 98 %     1 Minute Oxygen  Saturation % 95 %     1 Minute Liters of Oxygen  0 L     2 Minute Oxygen  Saturation % 88 %  2:36-2:44, desat to 85%, PLB and back up to 92%     2 Minute Liters of Oxygen  0 L     3 Minute Oxygen  Saturation % 92 %     3 Minute Liters of Oxygen  0 L     4 Minute Oxygen  Saturation % 96 %     4 Minute Liters of Oxygen  0 L     5 Minute Oxygen  Saturation % 95 %     5 Minute Liters of Oxygen  0 L     6 Minute Oxygen  Saturation % 98 %     6 Minute Liters of Oxygen  0 L     2 Minute Post Oxygen  Saturation % 97 %     2 Minute Post Liters of Oxygen  0 L        Oxygen  Initial Assessment:  Oxygen  Initial Assessment - 07/10/24 1030       Home Oxygen    Home Oxygen  Device None    Sleep Oxygen  Prescription None    Home Exercise Oxygen  Prescription None    Home Resting Oxygen  Prescription None      Initial 6 min Walk   Oxygen  Used None      Program Oxygen  Prescription   Program Oxygen  Prescription None      Intervention   Short Term Goals To learn and understand importance of maintaining oxygen  saturations>88%;To learn and understand importance of monitoring SPO2 with pulse oximeter and demonstrate accurate use of the pulse oximeter.;To learn and demonstrate proper pursed lip breathing techniques or other breathing techniques. ;To learn and demonstrate proper use of  respiratory medications;To learn and exhibit compliance with exercise, home and travel O2 prescription  Long  Term Goals Exhibits compliance with exercise, home  and travel O2 prescription;Maintenance of O2 saturations>88%;Verbalizes importance of monitoring SPO2 with pulse oximeter and return demonstration;Exhibits proper breathing techniques, such as pursed lip breathing or other method taught during program session;Demonstrates proper use of MDI's;Compliance with respiratory medication          Oxygen  Re-Evaluation:  Oxygen  Re-Evaluation     Row Name 07/16/24 1545 08/12/24 0912 09/07/24 0935 10/08/24 1639       Program Oxygen  Prescription   Program Oxygen  Prescription None None None None      Home Oxygen    Home Oxygen  Device None None None None    Sleep Oxygen  Prescription None None None None    Home Exercise Oxygen  Prescription None None None None    Home Resting Oxygen  Prescription None None None None      Goals/Expected Outcomes   Short Term Goals To learn and understand importance of maintaining oxygen  saturations>88%;To learn and understand importance of monitoring SPO2 with pulse oximeter and demonstrate accurate use of the pulse oximeter.;To learn and demonstrate proper pursed lip breathing techniques or other breathing techniques. ;To learn and demonstrate proper use of respiratory medications;To learn and exhibit compliance with exercise, home and travel O2 prescription To learn and understand importance of maintaining oxygen  saturations>88%;To learn and understand importance of monitoring SPO2 with pulse oximeter and demonstrate accurate use of the pulse oximeter.;To learn and demonstrate proper pursed lip breathing techniques or other breathing techniques. ;To learn and demonstrate proper use of respiratory medications;To learn and exhibit compliance with exercise, home and travel O2 prescription To learn and understand importance of maintaining oxygen  saturations>88%;To learn  and understand importance of monitoring SPO2 with pulse oximeter and demonstrate accurate use of the pulse oximeter.;To learn and demonstrate proper pursed lip breathing techniques or other breathing techniques. ;To learn and demonstrate proper use of respiratory medications;To learn and exhibit compliance with exercise, home and travel O2 prescription To learn and understand importance of maintaining oxygen  saturations>88%;To learn and understand importance of monitoring SPO2 with pulse oximeter and demonstrate accurate use of the pulse oximeter.;To learn and demonstrate proper pursed lip breathing techniques or other breathing techniques. ;To learn and demonstrate proper use of respiratory medications;To learn and exhibit compliance with exercise, home and travel O2 prescription    Long  Term Goals Exhibits compliance with exercise, home  and travel O2 prescription;Maintenance of O2 saturations>88%;Verbalizes importance of monitoring SPO2 with pulse oximeter and return demonstration;Exhibits proper breathing techniques, such as pursed lip breathing or other method taught during program session;Demonstrates proper use of MDI's;Compliance with respiratory medication Exhibits compliance with exercise, home  and travel O2 prescription;Maintenance of O2 saturations>88%;Verbalizes importance of monitoring SPO2 with pulse oximeter and return demonstration;Exhibits proper breathing techniques, such as pursed lip breathing or other method taught during program session;Demonstrates proper use of MDI's;Compliance with respiratory medication Exhibits compliance with exercise, home  and travel O2 prescription;Maintenance of O2 saturations>88%;Verbalizes importance of monitoring SPO2 with pulse oximeter and return demonstration;Exhibits proper breathing techniques, such as pursed lip breathing or other method taught during program session;Demonstrates proper use of MDI's;Compliance with respiratory medication Exhibits  compliance with exercise, home  and travel O2 prescription;Maintenance of O2 saturations>88%;Verbalizes importance of monitoring SPO2 with pulse oximeter and return demonstration;Exhibits proper breathing techniques, such as pursed lip breathing or other method taught during program session;Demonstrates proper use of MDI's;Compliance with respiratory medication    Goals/Expected Outcomes Compliance and understanding of oxygen  saturation monitoring and breathing techniques to decrease shortness of breath.  Compliance and understanding of oxygen  saturation monitoring and breathing techniques to decrease shortness of breath. Compliance and understanding of oxygen  saturation monitoring and breathing techniques to decrease shortness of breath. Compliance and understanding of oxygen  saturation monitoring and breathing techniques to decrease shortness of breath.       Oxygen  Discharge (Final Oxygen  Re-Evaluation):  Oxygen  Re-Evaluation - 10/08/24 1639       Program Oxygen  Prescription   Program Oxygen  Prescription None      Home Oxygen    Home Oxygen  Device None    Sleep Oxygen  Prescription None    Home Exercise Oxygen  Prescription None    Home Resting Oxygen  Prescription None      Goals/Expected Outcomes   Short Term Goals To learn and understand importance of maintaining oxygen  saturations>88%;To learn and understand importance of monitoring SPO2 with pulse oximeter and demonstrate accurate use of the pulse oximeter.;To learn and demonstrate proper pursed lip breathing techniques or other breathing techniques. ;To learn and demonstrate proper use of respiratory medications;To learn and exhibit compliance with exercise, home and travel O2 prescription    Long  Term Goals Exhibits compliance with exercise, home  and travel O2 prescription;Maintenance of O2 saturations>88%;Verbalizes importance of monitoring SPO2 with pulse oximeter and return demonstration;Exhibits proper breathing techniques, such as  pursed lip breathing or other method taught during program session;Demonstrates proper use of MDI's;Compliance with respiratory medication    Goals/Expected Outcomes Compliance and understanding of oxygen  saturation monitoring and breathing techniques to decrease shortness of breath.          Initial Exercise Prescription:  Initial Exercise Prescription - 07/10/24 1100       Date of Initial Exercise RX and Referring Provider   Date 07/10/24    Referring Provider Dr. Fairy Danker ([facility])    Expected Discharge Date 07/10/24      NuStep   Level 2    SPM 60    Minutes 15    METs 1.8      Track   Laps 15    Minutes 15    METs 2.04      Prescription Details   Frequency (times per week) 2    Duration Progress to 30 minutes of continuous aerobic without signs/symptoms of physical distress      Intensity   THRR 40-80% of Max Heartrate 64-127    Ratings of Perceived Exertion 11-13    Perceived Dyspnea 0-4      Progression   Progression Continue to progress workloads to maintain intensity without signs/symptoms of physical distress.      Resistance Training   Training Prescription Yes    Weight red bands    Reps 10-15          Perform Capillary Blood Glucose checks as needed.  Exercise Prescription Changes:   Exercise Prescription Changes     Row Name 07/21/24 1100 08/04/24 1200 08/18/24 1200 08/27/24 1204 09/08/24 0944     Response to Exercise   Blood Pressure (Admit) 112/70 120/70 132/68 110/70 118/72   Blood Pressure (Exercise) 118/70 128/70 142/70 -- --   Blood Pressure (Exit) 104/64 104/68 128/78 112/80 120/68   Heart Rate (Admit) 85 bpm 80 bpm 95 bpm 79 bpm 75 bpm   Heart Rate (Exercise) 104 bpm 93 bpm 88 bpm 110 bpm 97 bpm   Heart Rate (Exit) 86 bpm 68 bpm 82 bpm 85 bpm 75 bpm   Oxygen  Saturation (Admit) 94 % 98 % 96 % 97 % 96 %   Oxygen  Saturation (Exercise)  94 % 94 % 96 % 93 % 91 %   Oxygen  Saturation (Exit) 95 % 97 % 98 % 96 % 97 %   Rating of  Perceived Exertion (Exercise) 11 10 10 12 13    Perceived Dyspnea (Exercise) 2 1 1 2 1    Duration Progress to 30 minutes of  aerobic without signs/symptoms of physical distress Progress to 30 minutes of  aerobic without signs/symptoms of physical distress Continue with 30 min of aerobic exercise without signs/symptoms of physical distress. Continue with 30 min of aerobic exercise without signs/symptoms of physical distress. Continue with 30 min of aerobic exercise without signs/symptoms of physical distress.   Intensity THRR unchanged THRR unchanged THRR unchanged THRR unchanged THRR unchanged     Progression   Progression Continue to progress workloads to maintain intensity without signs/symptoms of physical distress. Continue to progress workloads to maintain intensity without signs/symptoms of physical distress. Continue to progress workloads to maintain intensity without signs/symptoms of physical distress. Continue to progress workloads to maintain intensity without signs/symptoms of physical distress. Continue to progress workloads to maintain intensity without signs/symptoms of physical distress.     Resistance Training   Training Prescription Yes Yes Yes Yes Yes   Weight red bands red bands red bands red bands red bands   Reps 10-15 10-15 10-15 10-15 10-15   Time 10 Minutes 10 Minutes 10 Minutes 10 Minutes 10 Minutes     Interval Training   Interval Training No -- -- -- --     NuStep   Level 2 2 2 3 3    SPM 67 78 80 -- --   Minutes 15 215 15 15 15    METs 1.8 2.1 2.1 2 1.9     Track   Laps 18 21 16 8 13    Minutes 15 15 15 15 15    METs 2.25 2.46 2.1 1.14 1.91    Row Name 09/24/24 0940             Response to Exercise   Blood Pressure (Admit) 104/60       Blood Pressure (Exit) 112/64       Heart Rate (Admit) 94 bpm       Heart Rate (Exercise) 118 bpm       Heart Rate (Exit) 88 bpm       Oxygen  Saturation (Admit) 99 %       Oxygen  Saturation (Exercise) 96 %       Oxygen   Saturation (Exit) 98 %       Rating of Perceived Exertion (Exercise) 8       Perceived Dyspnea (Exercise) 0       Duration Continue with 30 min of aerobic exercise without signs/symptoms of physical distress.       Intensity THRR unchanged         Progression   Progression Continue to progress workloads to maintain intensity without signs/symptoms of physical distress.         Resistance Training   Training Prescription Yes       Weight red bands       Reps 10-15       Time 10 Minutes         Treadmill   MPH 1.2       Grade 1       Minutes 15       METs 2         NuStep   Level 4       Minutes 15  METs 2.1          Exercise Comments:   Exercise Comments     Row Name 07/16/24 1150           Exercise Comments Pt completed her first day of group exercise. She exercised on the recumbent stepper for 15 min, level 2, METs 1.7. She then walked the track for 15 min, 1.84 METs. She felt dizzy and tired walking and needed rest stops. Encouraged hydration. Performed warm up and cool down standing including squats. Discussed METs with fair reception.          Exercise Goals and Review:   Exercise Goals     Row Name 07/10/24 1029             Exercise Goals   Increase Physical Activity Yes       Intervention Provide advice, education, support and counseling about physical activity/exercise needs.;Develop an individualized exercise prescription for aerobic and resistive training based on initial evaluation findings, risk stratification, comorbidities and participant's personal goals.       Expected Outcomes Short Term: Attend rehab on a regular basis to increase amount of physical activity.;Long Term: Exercising regularly at least 3-5 days a week.;Long Term: Add in home exercise to make exercise part of routine and to increase amount of physical activity.       Increase Strength and Stamina Yes       Intervention Provide advice, education, support and counseling about  physical activity/exercise needs.;Develop an individualized exercise prescription for aerobic and resistive training based on initial evaluation findings, risk stratification, comorbidities and participant's personal goals.       Expected Outcomes Short Term: Increase workloads from initial exercise prescription for resistance, speed, and METs.;Short Term: Perform resistance training exercises routinely during rehab and add in resistance training at home;Long Term: Improve cardiorespiratory fitness, muscular endurance and strength as measured by increased METs and functional capacity ( )       Able to understand and use rate of perceived exertion (RPE) scale Yes       Intervention Provide education and explanation on how to use RPE scale       Expected Outcomes Short Term: Able to use RPE daily in rehab to express subjective intensity level;Long Term:  Able to use RPE to guide intensity level when exercising independently       Able to understand and use Dyspnea scale Yes       Intervention Provide education and explanation on how to use Dyspnea scale       Expected Outcomes Short Term: Able to use Dyspnea scale daily in rehab to express subjective sense of shortness of breath during exertion;Long Term: Able to use Dyspnea scale to guide intensity level when exercising independently       Knowledge and understanding of Target Heart Rate Range (THRR) Yes       Intervention Provide education and explanation of THRR including how the numbers were predicted and where they are located for reference       Expected Outcomes Short Term: Able to state/look up THRR;Short Term: Able to use daily as guideline for intensity in rehab;Long Term: Able to use THRR to govern intensity when exercising independently       Understanding of Exercise Prescription Yes       Intervention Provide education, explanation, and written materials on patient's individual exercise prescription       Expected Outcomes Short Term:  Able to explain program exercise prescription;Long Term: Able to explain  home exercise prescription to exercise independently          Exercise Goals Re-Evaluation :  Exercise Goals Re-Evaluation     Row Name 07/16/24 1551 08/12/24 0910 09/07/24 0933 10/08/24 1639       Exercise Goal Re-Evaluation   Exercise Goals Review Increase Physical Activity;Able to understand and use Dyspnea scale;Understanding of Exercise Prescription;Increase Strength and Stamina;Knowledge and understanding of Target Heart Rate Range (THRR);Able to understand and use rate of perceived exertion (RPE) scale Increase Physical Activity;Able to understand and use Dyspnea scale;Understanding of Exercise Prescription;Increase Strength and Stamina;Knowledge and understanding of Target Heart Rate Range (THRR);Able to understand and use rate of perceived exertion (RPE) scale Increase Physical Activity;Able to understand and use Dyspnea scale;Understanding of Exercise Prescription;Increase Strength and Stamina;Knowledge and understanding of Target Heart Rate Range (THRR);Able to understand and use rate of perceived exertion (RPE) scale Increase Physical Activity;Able to understand and use Dyspnea scale;Understanding of Exercise Prescription;Increase Strength and Stamina;Knowledge and understanding of Target Heart Rate Range (THRR);Able to understand and use rate of perceived exertion (RPE) scale    Comments Keyuna has completed 1 exercise sessions. She exercises for 15 min on Nustep and track. She averages 1.7 METs at level 2 on the Nustep and 1.84 METs on the track. She performed the warmup and cooldown standing without limitaions. Joselle shows some deconditioning. Will continue to monitor and progress as able. Shaniqua has completed 6 exercise sessions. She exercises for 15 min on Nustep and track. She averages 1.9 METs at level 2 on the Nustep and 2.18 METs on the track. She performs the warmup and cooldown standing without  limitations. Cathy has increased her track laps. She seems very motivated to exercise. Will continue to monitor and progress as able. Teena has completed 10 exercise sessions. She exercises for 15 min on Nustep and track. She averages 2 METs at level 3 on the Nustep and 1.56 METs on the track. She performs the warmup and cooldown standing without limitations. Laurelle has increased her level on the Nustep as METs have slightly increased. Her track laps have declined due to increased shortness of breath. Willette has missed several sessions because of her shortness of breath. Will continue to monitor and progress as able. Alanea has completed 14 exercise sessions. She exercises for 15 min on Nustep and track. She averages 2.1 METs at level 4 on the Nustep and 2.0 METs at 1.2 mph and 1% incline on the treadmill. She performs the warmup and cooldown standing without limitations. Kensie has increased her level on the Nustep. She has also progressed from track walking to treadmill walking. She tolerates the treadmill well. Chameka has missed several sessions due to a recent hospitalization. Will continue to monitor and progress as able.    Expected Outcomes Through exercise at rehab and home, the patient will decrease shortness of breath with daily activities and feel confident in carrying out an exercise regimen at home. Through exercise at rehab and home, the patient will decrease shortness of breath with daily activities and feel confident in carrying out an exercise regimen at home. Through exercise at rehab and home, the patient will decrease shortness of breath with daily activities and feel confident in carrying out an exercise regimen at home. Through exercise at rehab and home, the patient will decrease shortness of breath with daily activities and feel confident in carrying out an exercise regimen at home.       Discharge Exercise Prescription (Final Exercise Prescription Changes):  Exercise  Prescription  Changes - 09/24/24 0940       Response to Exercise   Blood Pressure (Admit) 104/60    Blood Pressure (Exit) 112/64    Heart Rate (Admit) 94 bpm    Heart Rate (Exercise) 118 bpm    Heart Rate (Exit) 88 bpm    Oxygen  Saturation (Admit) 99 %    Oxygen  Saturation (Exercise) 96 %    Oxygen  Saturation (Exit) 98 %    Rating of Perceived Exertion (Exercise) 8    Perceived Dyspnea (Exercise) 0    Duration Continue with 30 min of aerobic exercise without signs/symptoms of physical distress.    Intensity THRR unchanged      Progression   Progression Continue to progress workloads to maintain intensity without signs/symptoms of physical distress.      Resistance Training   Training Prescription Yes    Weight red bands    Reps 10-15    Time 10 Minutes      Treadmill   MPH 1.2    Grade 1    Minutes 15    METs 2      NuStep   Level 4    Minutes 15    METs 2.1          Nutrition:  Target Goals: Understanding of nutrition guidelines, daily intake of sodium 1500mg , cholesterol 200mg , calories 30% from fat and 7% or less from saturated fats, daily to have 5 or more servings of fruits and vegetables.  Biometrics:  Pre Biometrics - 07/10/24 1020       Pre Biometrics   Grip Strength 18 kg   new dynomometer          Nutrition Therapy Plan and Nutrition Goals:  Nutrition Therapy & Goals - 07/17/24 1557       Nutrition Therapy   Diet General Healthy Diet    Drug/Food Interactions Statins/Certain Fruits      Personal Nutrition Goals   Nutrition Goal Patient to improve diet quality by using the plate method as a guide for meal planning to include lean protein/plant protein, fruits, vegetables, whole grains, nonfat dairy as part of a well-balanced diet.    Comments Patient has medical history of HTN, peripheral vascular disease, hyperlipidemia, former smoker, prediabetes, cirrhosis, COPD3. LDL is well controlled. A1c remains in a prediabetic range. Liver enzymes  are WNL.  She reports following a reguarly diet and reports that she has been working on diet and exercise to aid with weight loss. Patient will benefit from participation in pulmonary rehab for nutrition education, exercise, and lifestyle modification.      Intervention Plan   Intervention Prescribe, educate and counsel regarding individualized specific dietary modifications aiming towards targeted core components such as weight, hypertension, lipid management, diabetes, heart failure and other comorbidities.;Nutrition handout(s) given to patient.    Expected Outcomes Short Term Goal: Understand basic principles of dietary content, such as calories, fat, sodium, cholesterol and nutrients.;Long Term Goal: Adherence to prescribed nutrition plan.          Nutrition Assessments:  MEDIFICTS Score Key: >=70 Need to make dietary changes  40-70 Heart Healthy Diet <= 40 Therapeutic Level Cholesterol Diet   Picture Your Plate Scores: <59 Unhealthy dietary pattern with much room for improvement. 41-50 Dietary pattern unlikely to meet recommendations for good health and room for improvement. 51-60 More healthful dietary pattern, with some room for improvement.  >60 Healthy dietary pattern, although there may be some specific behaviors that could be improved.    Nutrition  Goals Re-Evaluation:  Nutrition Goals Re-Evaluation     Row Name 07/17/24 1557             Goals   Current Weight 191 lb 9.3 oz (86.9 kg)       Comment hemoglobin 10, A1c 6.0, LDL 80, HDL 77       Expected Outcome Patient has medical history of HTN, peripheral vascular disease, hyperlipidemia, former smoker, prediabetes, cirrhosis, COPD3. LDL is well controlled. A1c remains in a prediabetic range. Liver enzymes are WNL. She reports following a reguarly diet and reports that she has been working on diet and exercise to aid with weight loss. Will continue to monitor weight and discuss strategies for weight loss. Patient will  benefit from participation in pulmonary rehab for nutrition education, exercise, and lifestyle modification.          Nutrition Goals Discharge (Final Nutrition Goals Re-Evaluation):  Nutrition Goals Re-Evaluation - 07/17/24 1557       Goals   Current Weight 191 lb 9.3 oz (86.9 kg)    Comment hemoglobin 10, A1c 6.0, LDL 80, HDL 77    Expected Outcome Patient has medical history of HTN, peripheral vascular disease, hyperlipidemia, former smoker, prediabetes, cirrhosis, COPD3. LDL is well controlled. A1c remains in a prediabetic range. Liver enzymes are WNL. She reports following a reguarly diet and reports that she has been working on diet and exercise to aid with weight loss. Will continue to monitor weight and discuss strategies for weight loss. Patient will benefit from participation in pulmonary rehab for nutrition education, exercise, and lifestyle modification.          Psychosocial: Target Goals: Acknowledge presence or absence of significant depression and/or stress, maximize coping skills, provide positive support system. Participant is able to verbalize types and ability to use techniques and skills needed for reducing stress and depression.  Initial Review & Psychosocial Screening:  Initial Psych Review & Screening - 07/10/24 1030       Initial Review   Current issues with History of Depression;Current Stress Concerns    Source of Stress Concerns Family;Chronic Illness      Family Dynamics   Good Support System? Yes    Concerns Recent loss of significant other      Barriers   Psychosocial barriers to participate in program The patient should benefit from training in stress management and relaxation.;Psychosocial barriers identified (see note)      Screening Interventions   Interventions Encouraged to exercise;Program counselor consult;To provide support and resources with identified psychosocial needs;Provide feedback about the scores to participant    Expected Outcomes  Short Term goal: Utilizing psychosocial counselor, staff and physician to assist with identification of specific Stressors or current issues interfering with healing process. Setting desired goal for each stressor or current issue identified.;Long Term Goal: Stressors or current issues are controlled or eliminated.;Short Term goal: Identification and review with participant of any Quality of Life or Depression concerns found by scoring the questionnaire.;Long Term goal: The participant improves quality of Life and PHQ9 Scores as seen by post scores and/or verbalization of changes          Quality of Life Scores:  Scores of 19 and below usually indicate a poorer quality of life in these areas.  A difference of  2-3 points is a clinically meaningful difference.  A difference of 2-3 points in the total score of the Quality of Life Index has been associated with significant improvement in overall quality of life, self-image, physical  symptoms, and general health in studies assessing change in quality of life.  PHQ-9: Review Flowsheet  More data exists      10/09/2024 09/30/2024 07/10/2024 03/25/2024 03/06/2024  Depression screen PHQ 2/9  Decreased Interest 0 1 0 1 0 0 0  Down, Depressed, Hopeless 0 3 0 1 0 0 0  PHQ - 2 Score 0 4 0 2 0 0 0  Altered sleeping 0 3 1 - -  Tired, decreased energy 0 3 1 - -  Change in appetite 0 3 1 - -  Feeling bad or failure about yourself  0 0 0 - -  Trouble concentrating 0 3 1 - -  Moving slowly or fidgety/restless 0 3 0 - -  Suicidal thoughts 0 0 0 - -  PHQ-9 Score 0 19 6 - -  Difficult doing work/chores Not difficult at all Somewhat difficult Somewhat difficult - -    Details       Multiple values from one day are sorted in reverse-chronological order        Interpretation of Total Score  Total Score Depression Severity:  1-4 = Minimal depression, 5-9 = Mild depression, 10-14 = Moderate depression, 15-19 = Moderately severe depression, 20-27 = Severe  depression   Psychosocial Evaluation and Intervention:  Psychosocial Evaluation - 07/10/24 1031       Psychosocial Evaluation & Interventions   Interventions Therapist referral;Encouraged to exercise with the program and follow exercise prescription    Continue Psychosocial Services  Follow up required by counselor          Psychosocial Re-Evaluation:  Psychosocial Re-Evaluation     Row Name 07/17/24 1249 08/12/24 0947 09/07/24 1221 10/05/24 1339       Psychosocial Re-Evaluation   Current issues with History of Depression;Current Stress Concerns History of Depression;Current Stress Concerns History of Depression;Current Stress Concerns History of Depression;Current Stress Concerns    Comments Elisa has attended one session so far. She denies any new psychosocial barriers or concerns at this time. Referral was placed to a mental health specialist. Panzy denies any new psychosocial barriers or concerns at this time. She is still dealing with the loss of her significant other. She enjoy's coming to class and has made some friends in class. No needs at this time. Monthly psychosocial re-evaluation as follows: Tanielle denies any new psychosocial barriers or concerns currently. She is still dealing with the loss of her significant other. She is now seeing a therapist for her mental health. She enjoys coming to class and has made some friends in class for support. Betsaida denies any other needs or concerns at this time. Monthly psychosocial re-evaluation as follows: Zeenat continues to deny any new psy/soc barriers or concerns at this time.    Expected Outcomes For Laelah to have less stress while participating in the program. For Efrata to have less stress while participating in the program. For Kyriana to attend pulmonary rehab and to have a positive outlook and good coping skills to manage her health-related stress. To have decreased s/s of depression. To use relaxation and stress  management skills taught in class. To ask for resources or referrals if needed. For Abriana to attend pulmonary rehab and to have a positive outlook and good coping skills to manage her health-related stress. To have decreased s/s of depression. To use relaxation and stress management skills taught in class. To ask for resources or referrals if needed.    Interventions Encouraged to attend Pulmonary Rehabilitation for the exercise Encouraged  to attend Pulmonary Rehabilitation for the exercise;Stress management education;Relaxation education Encouraged to attend Pulmonary Rehabilitation for the exercise Encouraged to attend Pulmonary Rehabilitation for the exercise    Continue Psychosocial Services  Follow up required by staff Follow up required by staff Follow up required by staff Follow up required by staff       Psychosocial Discharge (Final Psychosocial Re-Evaluation):  Psychosocial Re-Evaluation - 10/05/24 1339       Psychosocial Re-Evaluation   Current issues with History of Depression;Current Stress Concerns    Comments Monthly psychosocial re-evaluation as follows: Annamaria continues to deny any new psy/soc barriers or concerns at this time.    Expected Outcomes For Laelani to attend pulmonary rehab and to have a positive outlook and good coping skills to manage her health-related stress. To have decreased s/s of depression. To use relaxation and stress management skills taught in class. To ask for resources or referrals if needed.    Interventions Encouraged to attend Pulmonary Rehabilitation for the exercise    Continue Psychosocial Services  Follow up required by staff          Education: Education Goals: Education classes will be provided on a weekly basis, covering required topics. Participant will state understanding/return demonstration of topics presented.  Learning Barriers/Preferences:  Learning Barriers/Preferences - 07/10/24 1032       Learning Barriers/Preferences    Learning Barriers None    Learning Preferences None          Education Topics: Know Your Numbers Group instruction that is supported by a PowerPoint presentation. Instructor discusses importance of knowing and understanding resting, exercise, and post-exercise oxygen  saturation, heart rate, and blood pressure. Oxygen  saturation, heart rate, blood pressure, rating of perceived exertion, and dyspnea are reviewed along with a normal range for these values.    Exercise for the Pulmonary Patient Group instruction that is supported by a PowerPoint presentation. Instructor discusses benefits of exercise, core components of exercise, frequency, duration, and intensity of an exercise routine, importance of utilizing pulse oximetry during exercise, safety while exercising, and options of places to exercise outside of rehab.  Flowsheet Row PULMONARY REHAB CHRONIC OBSTRUCTIVE PULMONARY DISEASE from 08/27/2024 in [facility], [facility], [facility]  Date 08/27/24  Educator EP  Instruction Review Code 1- Verbalizes Understanding    MET Level  Group instruction provided by PowerPoint, verbal discussion, and written material to support subject matter. Instructor reviews what METs are and how to increase METs.  Flowsheet Row PULMONARY REHAB CHRONIC OBSTRUCTIVE PULMONARY DISEASE from 07/30/2024 in [facility] for Heart, Vascular, & Lung Health  Date 07/30/24  Educator EP  Instruction Review Code 1- Verbalizes Understanding    Pulmonary Medications Verbally interactive group education provided by instructor with focus on inhaled medications and proper administration. Flowsheet Row PULMONARY REHAB CHRONIC OBSTRUCTIVE PULMONARY DISEASE from 08/20/2024 in [facility] for [facility], Vascular, [facility]  Date 08/20/24  Educator RT  Instruction Review Code 1- Verbalizes Understanding    Anatomy and Physiology of the Respiratory  System Group instruction provided by PowerPoint, verbal discussion, and written material to support subject matter. Instructor reviews respiratory cycle and anatomical components of the respiratory system and their functions. Instructor also reviews differences in obstructive and restrictive respiratory diseases with examples of each.    Oxygen  Safety Group instruction provided by PowerPoint, verbal discussion, and written material to support subject matter. There is an overview of "What is Oxygen " and "Why  do we need it".  Instructor also reviews how to create a safe environment for oxygen  use, the importance of using oxygen  as prescribed, and the risks of noncompliance. There is a brief discussion on traveling with oxygen  and resources the patient may utilize.   Oxygen  Use Group instruction provided by PowerPoint, verbal discussion, and written material to discuss how supplemental oxygen  is prescribed and different types of oxygen  supply systems. Resources for more information are provided.  Flowsheet Row PULMONARY REHAB CHRONIC OBSTRUCTIVE PULMONARY DISEASE from 09/17/2024 in [facility] for [facility], Vascular, [facility]  Date 09/17/24  Educator RT  Instruction Review Code 1- Verbalizes Understanding    Breathing Techniques Group instruction that is supported by demonstration and informational handouts. Instructor discusses the benefits of pursed lip and diaphragmatic breathing and detailed demonstration on how to perform both.  Flowsheet Row PULMONARY REHAB CHRONIC OBSTRUCTIVE PULMONARY DISEASE from 09/24/2024 in [facility] for [facility], Vascular, [facility]  Date 09/24/24  Educator RN  Instruction Review Code 1- Verbalizes Understanding     Risk Factor Reduction Group instruction that is supported by a PowerPoint presentation. Instructor discusses the definition of a risk factor, different risk factors for pulmonary disease, and how the  heart and lungs work together.   Pulmonary Diseases Group instruction provided by PowerPoint, verbal discussion, and written material to support subject matter. Instructor gives an overview of the different type of pulmonary diseases. There is also a discussion on risk factors and symptoms as well as ways to manage the diseases. Flowsheet Row PULMONARY REHAB CHRONIC OBSTRUCTIVE PULMONARY DISEASE from 08/06/2024 in [facility] for Heart, Vascular, & Lung Health  Date 08/06/24  Educator RT  Instruction Review Code 1- Verbalizes Understanding    Stress and Energy Conservation Group instruction provided by PowerPoint, verbal discussion, and written material to support subject matter. Instructor gives an overview of stress and the impact it can have on the body. Instructor also reviews ways to reduce stress. There is also a discussion on energy conservation and ways to conserve energy throughout the day.   Warning Signs and Symptoms Group instruction provided by PowerPoint, verbal discussion, and written material to support subject matter. Instructor reviews warning signs and symptoms of stroke, heart attack, cold and flu. Instructor also reviews ways to prevent the spread of infection. Flowsheet Row PULMONARY REHAB CHRONIC OBSTRUCTIVE PULMONARY DISEASE from 07/16/2024 in [facility] for Heart, Vascular, & Lung Health  Date 07/16/24  Educator RN  Instruction Review Code 1- Verbalizes Understanding    Other Education Group or individual verbal, written, or video instructions that support the educational goals of the pulmonary rehab program.    Knowledge Questionnaire Score:  Knowledge Questionnaire Score - 07/10/24 1147       Knowledge Questionnaire Score   Pre Score 11/18          Core Components/Risk Factors/Patient Goals at Admission:  Personal Goals and Risk Factors at Admission - 07/10/24 1032       Core Components/Risk  Factors/Patient Goals on Admission    Weight Management Yes;Weight Loss    Intervention Weight Management: Develop a combined nutrition and exercise program designed to reach desired caloric intake, while maintaining appropriate intake of nutrient and fiber, sodium and fats, and appropriate energy expenditure required for the weight goal.;Weight Management: Provide education and appropriate resources to help participant work on and attain dietary goals.;Weight Management/Obesity: Establish reasonable short term and long term weight  goals.;Obesity: Provide education and appropriate resources to help participant work on and attain dietary goals.    Expected Outcomes Short Term: Continue to assess and modify interventions until short term weight is achieved;Long Term: Adherence to nutrition and physical activity/exercise program aimed toward attainment of established weight goal;Weight Loss: Understanding of general recommendations for a balanced deficit meal plan, which promotes 1-2 lb weight loss per week and includes a negative energy balance of [phone] kcal/d;Understanding recommendations for meals to include 15-35% energy as protein, 25-35% energy from fat, 35-60% energy from carbohydrates, less than 200mg  of dietary cholesterol, 20-35 gm of total fiber daily;Understanding of distribution of calorie intake throughout the day with the consumption of 4-5 meals/snacks    Improve shortness of breath with ADL's Yes    Intervention Provide education, individualized exercise plan and daily activity instruction to help decrease symptoms of SOB with activities of daily living.    Expected Outcomes Short Term: Improve cardiorespiratory fitness to achieve a reduction of symptoms when performing ADLs;Long Term: Be able to perform more ADLs without symptoms or delay the onset of symptoms    Stress Yes    Intervention Offer individual and/or small group education and counseling on adjustment to heart disease, stress  management and health-related lifestyle change. Teach and support self-help strategies.;Refer participants experiencing significant psychosocial distress to appropriate mental health specialists for further evaluation and treatment. When possible, include family members and significant others in education/counseling sessions.    Expected Outcomes Short Term: Participant demonstrates changes in health-related behavior, relaxation and other stress management skills, ability to obtain effective social support, and compliance with psychotropic medications if prescribed.;Long Term: Emotional wellbeing is indicated by absence of clinically significant psychosocial distress or social isolation.          Core Components/Risk Factors/Patient Goals Review:   Goals and Risk Factor Review     Row Name 07/17/24 1253 08/12/24 0952 09/07/24 1224 10/05/24 1340       Core Components/Risk Factors/Patient Goals Review   Personal Goals Review Weight Management/Obesity;Improve shortness of breath with ADL's;Develop more efficient breathing techniques such as purse lipped breathing and diaphragmatic breathing and practicing self-pacing with activity.;Stress Weight Management/Obesity;Improve shortness of breath with ADL's;Develop more efficient breathing techniques such as purse lipped breathing and diaphragmatic breathing and practicing self-pacing with activity.;Stress Weight Management/Obesity;Improve shortness of breath with ADL's;Develop more efficient breathing techniques such as purse lipped breathing and diaphragmatic breathing and practicing self-pacing with activity.;Stress Weight Management/Obesity;Improve shortness of breath with ADL's    Review Monthly review of patient's Core Components/Risk Factors/Patient Goals are as follows: Everett has attended one session so far. Goal progressing for losing weight. Goal progressing for improving shortness of breath with ADL's. Orlandria is currently exercising on RA to  maintain sats >88%. Goal progressing for developing more efficient breathing techniques such as purse lipped breathing and diaphragmatic breathing; and practicing self-pacing with activity. Goal progressing to reduce stress. We will continue to monitor Grisell's progress throughout the program. Monthly review of patient's Core Components/Risk Factors/Patient Goals are as follows: Goal progressing for losing weight. Veroncia has met with the staff dietitian on ways to achieve her weight loss goals. Goal progressing for improving shortness of breath with ADL's. Calene is currently exercising on RA to maintain sats >88%. Goal progressing for developing more efficient breathing techniques such as purse lipped breathing and diaphragmatic breathing; and practicing self-pacing with activity. Goal progressing to reduce stress. We will continue to monitor Cherita's progress throughout the program. Monthly review of patient's Core Components/Risk  Factors/Patient Goals are as follows: Goal progressing for losing weight. Haven has met with the staff dietitian and planned for dietary changes. She has yet to implement these changes. Her goal is to incorporate more fruits and veggies and use the plate method. She has maintained her weight since starting the program. Goal progressing for improving shortness of breath with ADL's. Nicosha is currently exercising on RA to maintain sats >88%. She has been able to increase her workload and METs on the NuStep and lap count on the track. Goal met for developing more efficient breathing techniques such as purse lipped breathing and diaphragmatic breathing; and practicing self-pacing with activity. Dickey can perform purse lipped breathing while short of breath. She demonstrated this while performing the warmup and while exercising. She can initiate PLB on her own. She works on diaphragmatic breathing at home. Goal met on reducing stress. Dejanee has applied stress reducing tips taught in  class and is now seeing a therapist about her decline in health and the loss of her significant other. She declines any additional needs at this time. We will continue to monitor Kaithlyn's progress throughout the program. Monthly review of patient's Core Components/Risk Factors/Patient Goals are as follows: Goal progressing for losing weight. Tandi has met with the staff dietitian and planned for dietary changes. She has yet to implement these changes. Her goal is to incorporate more fruits and veggies and use the plate method. She has maintained her weight since starting the program. Goal progressing for improving shortness of breath with ADL's. Lielle is currently exercising on RA to maintain sats >88%. She has been able to increase her workload and METs on the NuStep and lap count on the track.  We will continue to monitor Annisha's progress throughout the program.    Expected Outcomes To improve shortness of breath with ADL's, develop more efficient breathing techniques such as purse lipped breathing and diaphragmatic breathing; and practicing self-pacing with activity, lose weight and reduce stress To improve shortness of breath with ADL's, develop more efficient breathing techniques such as purse lipped breathing and diaphragmatic breathing; and practicing self-pacing with activity, lose weight and reduce stress Pt will show progress toward meeting expected goals and outcomes. Pt will show progress toward meeting expected goals and outcomes.       Core Components/Risk Factors/Patient Goals at Discharge (Final Review):   Goals and Risk Factor Review - 10/05/24 1340       Core Components/Risk Factors/Patient Goals Review   Personal Goals Review Weight Management/Obesity;Improve shortness of breath with ADL's    Review Monthly review of patient's Core Components/Risk Factors/Patient Goals are as follows: Goal progressing for losing weight. Kathlen has met with the staff dietitian and planned for  dietary changes. She has yet to implement these changes. Her goal is to incorporate more fruits and veggies and use the plate method. She has maintained her weight since starting the program. Goal progressing for improving shortness of breath with ADL's. Katalina is currently exercising on RA to maintain sats >88%. She has been able to increase her workload and METs on the NuStep and lap count on the track.  We will continue to monitor Dickie's progress throughout the program.    Expected Outcomes Pt will show progress toward meeting expected goals and outcomes.          ITP Comments:   Comments: Pt is making expected progress toward Pulmonary Rehab goals after completing 14 session(s). Recommend continued exercise, life style modification, education, and utilization of breathing  techniques to increase stamina and strength, while also decreasing shortness of breath with exertion.  Dr. Slater Staff is Medical Director for Pulmonary Rehab at [facility].

## 2024-10-15 ENCOUNTER — Inpatient Hospital Stay (HOSPITAL_COMMUNITY): Admission: RE | Admit: 2024-10-15

## 2024-10-16 ENCOUNTER — Emergency Department (HOSPITAL_COMMUNITY)

## 2024-10-16 ENCOUNTER — Emergency Department (HOSPITAL_COMMUNITY): Admission: EM | Admit: 2024-10-16 | Discharge: 2024-10-16 | Disposition: A | Discharge status: Home / Self Care

## 2024-10-16 ENCOUNTER — Other Ambulatory Visit: Payer: Self-pay

## 2024-10-16 DIAGNOSIS — M79603 Pain in arm, unspecified: Secondary | ICD-10-CM | POA: Diagnosis not present

## 2024-10-16 DIAGNOSIS — D509 Iron deficiency anemia, unspecified: Secondary | ICD-10-CM | POA: Diagnosis not present

## 2024-10-16 DIAGNOSIS — I10 Essential (primary) hypertension: Secondary | ICD-10-CM | POA: Insufficient documentation

## 2024-10-16 DIAGNOSIS — M5412 Radiculopathy, cervical region: Secondary | ICD-10-CM | POA: Diagnosis present

## 2024-10-16 DIAGNOSIS — F319 Bipolar disorder, unspecified: Secondary | ICD-10-CM | POA: Diagnosis not present

## 2024-10-16 DIAGNOSIS — Z8673 Personal history of transient ischemic attack (TIA), and cerebral infarction without residual deficits: Secondary | ICD-10-CM | POA: Diagnosis not present

## 2024-10-16 DIAGNOSIS — Z79899 Other long term (current) drug therapy: Secondary | ICD-10-CM | POA: Insufficient documentation

## 2024-10-16 DIAGNOSIS — R918 Other nonspecific abnormal finding of lung field: Secondary | ICD-10-CM | POA: Diagnosis not present

## 2024-10-16 DIAGNOSIS — R202 Paresthesia of skin: Secondary | ICD-10-CM | POA: Diagnosis not present

## 2024-10-16 DIAGNOSIS — M542 Cervicalgia: Secondary | ICD-10-CM | POA: Diagnosis not present

## 2024-10-16 DIAGNOSIS — J439 Emphysema, unspecified: Secondary | ICD-10-CM | POA: Diagnosis not present

## 2024-10-16 DIAGNOSIS — D72829 Elevated white blood cell count, unspecified: Secondary | ICD-10-CM | POA: Insufficient documentation

## 2024-10-16 DIAGNOSIS — G4733 Obstructive sleep apnea (adult) (pediatric): Secondary | ICD-10-CM | POA: Diagnosis not present

## 2024-10-16 DIAGNOSIS — J4540 Moderate persistent asthma, uncomplicated: Secondary | ICD-10-CM | POA: Diagnosis not present

## 2024-10-16 DIAGNOSIS — R5383 Other fatigue: Secondary | ICD-10-CM | POA: Diagnosis not present

## 2024-10-16 DIAGNOSIS — J449 Chronic obstructive pulmonary disease, unspecified: Secondary | ICD-10-CM | POA: Diagnosis not present

## 2024-10-16 LAB — BASIC METABOLIC PANEL WITH GFR
Anion gap: 12 (ref 5–15)
BUN: 12 mg/dL (ref 8–23)
CO2: 23 mmol/L (ref 22–32)
Calcium: 9 mg/dL (ref 8.9–10.3)
Chloride: 105 mmol/L (ref 98–111)
Creatinine, Ser: 0.83 mg/dL (ref 0.44–1.00)
GFR, Estimated: >60 mL/min (ref >60)
Glucose, Bld: 91 mg/dL (ref 70–99)
Potassium: 4.3 mmol/L (ref 3.5–5.1)
Sodium: 140 mmol/L (ref 135–145)

## 2024-10-16 LAB — CBC
HCT: 35.8 % — ABNORMAL LOW (ref 36.0–46.0)
Hemoglobin: 10.6 g/dL — ABNORMAL LOW (ref 12.0–15.0)
MCH: 23.9 pg — ABNORMAL LOW (ref 26.0–34.0)
MCHC: 29.6 g/dL — ABNORMAL LOW (ref 30.0–36.0)
MCV: 80.6 fL (ref 80.0–100.0)
Platelets: 499 K/uL — ABNORMAL HIGH (ref 150–400)
RBC: 4.44 MIL/uL (ref 3.87–5.11)
RDW: 20.6 % — ABNORMAL HIGH (ref 11.5–15.5)
WBC: 20.3 K/uL — ABNORMAL HIGH (ref 4.0–10.5)
nRBC: 0 % (ref 0.0–0.2)

## 2024-10-16 LAB — CBG MONITORING, ED: Glucose-Capillary: 112 mg/dL — ABNORMAL HIGH (ref 70–99)

## 2024-10-16 LAB — TROPONIN I (HIGH SENSITIVITY): Troponin I (High Sensitivity): 4 ng/L (ref <18)

## 2024-10-16 MED ORDER — KETOROLAC TROMETHAMINE 15 MG/ML IJ SOLN
15.0000 mg | Freq: Once | INTRAMUSCULAR | Status: AC
  Administered 2024-10-16: 15 mg via INTRAVENOUS
  Filled 2024-10-16: qty 1

## 2024-10-16 MED ORDER — DIAZEPAM 5 MG/ML IJ SOLN
2.5000 mg | Freq: Once | INTRAMUSCULAR | Status: AC
  Administered 2024-10-16: 2.5 mg via INTRAVENOUS
  Filled 2024-10-16: qty 2

## 2024-10-16 MED ORDER — LIDOCAINE 5 % EX OINT: 1.0000 | TOPICAL_OINTMENT | CUTANEOUS | 0 refills | Status: AC | PRN

## 2024-10-16 MED ORDER — NAPROXEN 500 MG PO TABS: 500.0000 mg | ORAL_TABLET | Freq: Two times a day (BID) | ORAL | 0 refills | Status: AC

## 2024-10-16 MED ORDER — METHOCARBAMOL 500 MG PO TABS: 500.0000 mg | ORAL_TABLET | Freq: Two times a day (BID) | ORAL | 0 refills | Status: AC

## 2024-10-16 NOTE — Discharge Instructions (Addendum)
 Please take the anti-inflammatory medications as prescribed and take the Robaxin as needed for muscle spasm.  You may also try the lidocaine  patches on the neck.  Please follow-up with your doctor regarding this.  Please follow-up with your hematologist about your elevated white blood cell count.  Return to the ER for fevers, weakness, or worsening symptoms.  Your CT scan did show some mild emphysema.  Please follow-up with your primary care doctor and they may want to perform a CT scan of your chest without contrast at some point in the future for further screening.

## 2024-10-16 NOTE — ED Notes (Signed)
 Report given to leidy RN

## 2024-10-16 NOTE — ED Provider Notes (Signed)
 [place] EMERGENCY DEPARTMENT AT [facility] Provider Note   CSN: [id number] Arrival date & time: 10/16/24  8175     Patient presents with: cramping arm   [name] is a 63 y.o. female.   63 year old female with past medical history of hypertension and CVA in the past with no residual deficits presenting to the emergency department today with left-sided arm and neck pain as well as cramping.  The patient states that she has having some tingling and numbness sensation in her arm as well.  She denies any recent injuries.  Denies any fevers.  She is not had any bowel or bladder dysfunction or saddle anesthesia.  Denies any associated chest pain but states that the pain in her neck does kind of wraparound to the upper chest.  She came to the emergency department at time further evaluation.  Denies any associated headache.        Prior to Admission medications   Medication Sig Start Date End Date Taking? Authorizing Provider  lidocaine  (XYLOCAINE ) 5 % ointment Apply 1 Application topically as needed. 10/16/24  Yes Ula Prentice SAUNDERS, MD  methocarbamol (ROBAXIN) 500 MG tablet Take 1 tablet (500 mg total) by mouth 2 (two) times daily. 10/16/24  Yes Ula Prentice SAUNDERS, MD  naproxen (NAPROSYN) 500 MG tablet Take 1 tablet (500 mg total) by mouth 2 (two) times daily. 10/16/24  Yes Ula Prentice SAUNDERS, MD  AIRSUPRA  90-80 MCG/ACT AERO SMARTSIG:2 Puff(s) Via Inhaler 4 Times Daily PRN 05/10/24   [provider]  albuterol  (PROVENTIL ) (2.5 MG/3ML) 0.083% nebulizer solution USE 1 VIAL VIA NEBULIZER EVERY 4 HOURS AS NEEDED FOR WHEEZING OR SHORTNESS OF BREATH 03/26/24   Iva Marty Saltness, MD  albuterol  (VENTOLIN  HFA) 108 [phone] Base) MCG/ACT inhaler INHALE 2 PUFFS INTO THE LUNGS EVERY 6 HOURS AS NEEDED FOR WHEEZING OR SHORTNESS OF BREATH 09/17/24   Cheryl Reusing, FNP  amLODipine  (NORVASC ) 5 MG tablet Take 1 tablet (5 mg total) by mouth daily. 04/21/24   Patwardhan, Newman PARAS, MD   atorvastatin  (LIPITOR) 80 MG tablet Take 1 tablet (80 mg total) by mouth daily. 07/22/24   Patwardhan, Newman PARAS, MD  azelastine  (OPTIVAR ) 0.05 % ophthalmic solution Place 1 drop into both eyes 2 (two) times daily. 05/18/22   Norleen Lynwood ORN, MD  BREZTRI  AEROSPHERE 160-9-4.8 MCG/ACT AERO inhaler INHALE 2 PUFFS INTO THE LUNGS IN THE MORNING AND AT BEDTIME 08/10/24   Shelah Lamar RAMAN, MD  budesonide  (PULMICORT ) 0.5 MG/2ML nebulizer solution USE 2 ML(0.5 MG) VIA NEBULIZER IN THE MORNING AND AT BEDTIME 09/11/24   Iva Marty Saltness, MD  carbamazepine  (TEGRETOL ) 200 MG tablet Take 2-3 tablets (400-600 mg total) by mouth See admin instructions. Take 400mg  in the morning and 600mg  at bedtime. 02/06/23   Plovsky, Elna, MD  cetirizine  (ZYRTEC ) 10 MG tablet Take 10 mg by mouth daily.    [provider]  cromolyn  (OPTICROM ) 4 % ophthalmic solution Place 1 drop into both eyes 4 (four) times daily. 11/19/23   Iva Marty Saltness, MD  doxepin  (SINEQUAN ) 25 MG capsule TAKE 1 CAPSULE(25 MG) BY MOUTH AT BEDTIME AS NEEDED 05/25/24   Iva Marty Saltness, MD  Ensifentrine  (OHTUVAYRE ) 3 MG/2.5ML SUSP Inhale 3 mg into the lungs in the morning and at bedtime. 01/03/24   Iva Marty Saltness, MD  estradiol  (ESTRACE ) 0.01 % CREA vaginal cream PLACE 0.5 GRAMS VAGINALLY NIGHTLY FOR 2 WEEKS, THEN TWICE A WEEK AFTER 10/14/24   Guadlupe Lianne DASEN, MD  famotidine  (  PEPCID ) 40 MG tablet Take 1 tablet (40 mg total) by mouth 2 (two) times daily. 10/24/23   Iva Marty Saltness, MD  ferrous sulfate  325 (65 FE) MG EC tablet Take 1 tablet (325 mg total) by mouth daily with breakfast. 10/01/24   Hongalgi, Anand D, MD  fluticasone  (FLONASE ) 50 MCG/ACT nasal spray SHAKE LIQUID AND USE 1 SPRAY IN EACH NOSTRIL TWICE DAILY AS NEEDED FOR ALLERGIES OR RHINITIS 01/24/24   Iva Marty Saltness, MD  formoterol  (PERFOROMIST ) 20 MCG/2ML nebulizer solution Take 20 mcg by nebulization 2 (two) times daily. 07/23/23   [provider]  furosemide   (LASIX ) 20 MG tablet TAKE 1 TABLET BY MOUTH DAILY AS NEEDED 12/30/23   Norleen Lynwood ORN, MD  hydrOXYzine  (VISTARIL ) 25 MG capsule Take 1 capsule (25 mg total) by mouth at bedtime as needed for itching. 10/01/24   Hongalgi, Anand D, MD  ipratropium (ATROVENT  HFA) 17 MCG/ACT inhaler Inhale 2 puffs into the lungs every 4 (four) hours as needed for wheezing. 02/20/24   Iva Marty Saltness, MD  irbesartan  (AVAPRO ) 150 MG tablet Take 1 tablet (150 mg total) by mouth daily. 03/25/24   Norleen Lynwood ORN, MD  levocetirizine (XYZAL ) 5 MG tablet Take 1 tablet (5 mg total) by mouth every evening. 10/01/24   Hongalgi, Anand D, MD  meclizine  (ANTIVERT ) 12.5 MG tablet Take 12.5 mg by mouth 3 (three) times daily as needed for dizziness.    [provider]  montelukast  (SINGULAIR ) 10 MG tablet TAKE 1 TABLET(10 MG) BY MOUTH AT BEDTIME 08/12/24   Iva Marty Saltness, MD  omeprazole  (PRILOSEC) 40 MG capsule Take 1 capsule (40 mg total) by mouth daily. 03/06/24   Norleen Lynwood ORN, MD  ondansetron  (ZOFRAN -ODT) 4 MG disintegrating tablet DISSOLVE 1 TABLET(4 MG) ON THE TONGUE EVERY 8 HOURS AS NEEDED FOR NAUSEA OR VOMITING 07/20/24   Norleen Lynwood ORN, MD  pantoprazole  (PROTONIX ) 40 MG tablet Take 40 mg by mouth daily.    [provider]  predniSONE  (DELTASONE ) 10 MG tablet 3 tabs by mouth per day for 3 days,2tabs per day for 3 days,1tab per day for 3 days 09/30/24   Norleen Lynwood ORN, MD  Suvorexant  (BELSOMRA ) 15 MG TABS Take by mouth.    [provider]  tacrolimus  (PROTOPIC ) 0.03 % ointment Apply 1 Application topically 2 (two) times daily as needed (rash). 02/28/23   Iva Marty Saltness, MD  triamcinolone  ointment (KENALOG ) 0.1 % Apply 1 Application topically at bedtime. 10/24/23   Iva Marty Saltness, MD  Vibegron  (GEMTESA ) 75 MG TABS Take 1 tablet (75 mg total) by mouth daily. 03/13/24   Guadlupe Lianne DASEN, MD    Allergies: Sulfa antibiotics and Venofer  [iron  sucrose]    Review of Systems  Musculoskeletal:  Positive  for neck pain.  All other systems reviewed and are negative.   Updated Vital Signs BP 128/85 (BP Location: Right Arm)   Pulse 76   Temp 98.7 F (37.1 C)   Resp 18   Ht 5' 3 (1.6 m)   Wt 84.8 kg   SpO2 100%   BMI 33.12 kg/m   Physical Exam Vitals and nursing note reviewed.   Gen: NAD Eyes: PERRL, EOMI HEENT: no oropharyngeal swelling Neck: trachea midline, the patient has left-sided paraspinal tenderness as well as some mild tenderness over the mid cervical spine but no step-offs or deformities noted she is tender over the trapezius Resp: clear to auscultation bilaterally Card: RRR, no murmurs, rubs, or gallops Abd: nontender, nondistended Extremities:  no calf tenderness, no edema Vascular: 2+ radial pulses bilaterally, 2+ DP pulses bilaterally Neuro: NIH stroke scale of 0 Skin: no rashes Psyc: acting appropriately   (all labs ordered are listed, but only abnormal results are displayed) Labs Reviewed  CBC - Abnormal; Notable for the following components:      Result Value   WBC 20.3 (*)    Hemoglobin 10.6 (*)    HCT 35.8 (*)    MCH 23.9 (*)    MCHC 29.6 (*)    RDW 20.6 (*)    Platelets 499 (*)    All other components within normal limits  CBG MONITORING, ED - Abnormal; Notable for the following components:   Glucose-Capillary 112 (*)    All other components within normal limits  BASIC METABOLIC PANEL WITH GFR  TROPONIN I (HIGH SENSITIVITY)  TROPONIN I (HIGH SENSITIVITY)    EKG: EKG Interpretation Date/Time:  Friday October 16 2024 18:37:21 EDT Ventricular Rate:  83 PR Interval:  136 QRS Duration:  92 QT Interval:  385 QTC Calculation: 453 R Axis:   55  Text Interpretation: Sinus rhythm Confirmed by Ula Barter [phone]) on 10/16/2024 6:39:54 PM  Radiology: CT Cervical Spine Wo Contrast Result Date: 10/16/2024 CLINICAL DATA:  Neck pain, arm numbness EXAM: CT CERVICAL SPINE WITHOUT CONTRAST TECHNIQUE: Multidetector CT imaging of the cervical spine was  performed without intravenous contrast. Multiplanar CT image reconstructions were also generated. RADIATION DOSE REDUCTION: This exam was performed according to the departmental dose-optimization program which includes automated exposure control, adjustment of the mA and/or kV according to patient size and/or use of iterative reconstruction technique. COMPARISON:  06/20/2021 FINDINGS: Alignment: Grossly normal anatomic alignment. Skull base and vertebrae: No acute fracture. No primary bone lesion or focal pathologic process. Soft tissues and spinal canal: No prevertebral fluid or swelling. No visible canal hematoma. Disc levels: Stable postsurgical changes from ACDF spanning C3 through C7. No evidence of orthopedic hardware failure or loosening. Upper chest: Airway is patent. Emphysematous changes within the upper lobes. Other: Reconstructed images demonstrate no additional findings. IMPRESSION: 1. No acute cervical spine fracture. 2. Stable postsurgical changes. 3. Pulmonary emphysema is present. Pulmonary emphysema is an independent risk factor for lung cancer. Recommend patient be considered for lung cancer screening. [place]  Chief Financial Officer. Screening for Lung Cancer: [place]  Licensed Conveyancer. JAMA. 2021;325(10):962-970. Electronically Signed   By: Ozell Daring M.D.   On: 10/16/2024 20:11     Procedures   Medications Ordered in the ED  ketorolac  (TORADOL ) 15 MG/ML injection 15 mg (15 mg Intravenous Given 10/16/24 1844)  diazepam (VALIUM) injection 2.5 mg (2.5 mg Intravenous Given 10/16/24 1844)                                    Medical Decision Making 63 year old female with past medical history of hypertension and CVA in the past with no residual deficits presenting to the emergency department with numbness and tingling in her left arm as well as some neck pain.  I will further evaluate the patient here with a CT scan of her cervical spine for  further evaluation for acute bony abnormality such as compression deformities or malignancy related lesions.  Will obtain an EKG and troponin to eval for atypical ACS.  Suspicion for this is relatively low as her symptoms are reproducible here.  The patient has reassuring neurovascular exam and suspicion for aortic dissection  is low at this time.  Also doubt this is due to CVA given the pain in her left arm with accompanying the numbness and tingling.  I will give her Toradol  here after reviewing her most recent renal function from a few days ago as well as Valium and reevaluate.  The patient's work appears reassuring.  Her symptoms have resolved on reassessment.  She does have a leukocytosis here and looking back this does appear to be relatively persistent.  Denies any infectious symptoms.  She does have a hematologist and I have encouraged her to follow-up with them regarding this.  She is discharged with return precautions.  Amount and/or Complexity of Data Reviewed Labs: ordered. Radiology: ordered.  Risk Prescription drug management.        Final diagnoses:  Cervical radiculopathy  Leukocytosis, unspecified type    ED Discharge Orders          Ordered    naproxen (NAPROSYN) 500 MG tablet  2 times daily        10/16/24 2125    methocarbamol (ROBAXIN) 500 MG tablet  2 times daily        10/16/24 2125    lidocaine  (XYLOCAINE ) 5 % ointment  As needed        10/16/24 2125               Ula Prentice SAUNDERS, MD 10/16/24 2127

## 2024-10-16 NOTE — ED Notes (Signed)
 Assuming pt care, pt bib ems coming from home for LT arm cramping since 5pm today, pt aaox4, reporsts med hx. Htn chl, cva, neck sx., skin warm/dry. Pt report pain to LT arm, tolerable at this time. Family at bedside, call bell within reach.

## 2024-10-16 NOTE — ED Notes (Signed)
 Pt reports left arm feels slighting tingling and felt slightly weaker grip strength, called Tee MD to bedside, per MD eval no need to activate code stroke. Meds ordered. Pt denies has no drift or sensation deficits.

## 2024-10-16 NOTE — ED Triage Notes (Signed)
 Pt arrived via EMS from home CC left arm cramping. Denies unilateral weakness or facial droop. S/s not reproducible at this time. Hx CVA no deficits takes medications reg. VSS. MD at bedside

## 2024-10-20 ENCOUNTER — Encounter (HOSPITAL_COMMUNITY)
Admission: RE | Admit: 2024-10-20 | Discharge: 2024-10-20 | Disposition: A | Discharge status: Home / Self Care | Source: Ambulatory Visit | Attending: Pulmonary Disease | Admitting: Pulmonary Disease

## 2024-10-20 DIAGNOSIS — J449 Chronic obstructive pulmonary disease, unspecified: Secondary | ICD-10-CM | POA: Diagnosis not present

## 2024-10-20 NOTE — Progress Notes (Signed)
 Daily Session Note  Patient Details  Name: [name] MRN: [id number] Date of Birth: 09-10-1961 Referring Provider:   Conrad Ports Pulmonary Rehab Walk Test from 07/10/2024 in [facility]  Referring Provider Dr. Fairy Danker ([facility])    Encounter Date: 10/20/2024  Check In:  Session Check In - 10/20/24 1130       Check-In   Supervising physician immediately available to respond to emergencies [facility] MD immediately available    Physician(s) Rosaline Bane, NP    Location [facility]    Staff Present [name], MS, ACSM-CEP, Exercise Physiologist;Casey Claudene Candia Levin, RN, BSN;Finneus Kaneshiro BS, ACSM-CEP, Exercise Physiologist    Virtual Visit No    Medication changes reported     No    Fall or balance concerns reported    No    Tobacco Cessation No Change    Warm-up and Cool-down Performed as group-led instruction    Resistance Training Performed Yes    VAD Patient? No    PAD/SET Patient? No      Pain Assessment   Currently in Pain? No/denies          Capillary Blood Glucose: No results found. However, due to the size of the patient record, not all encounters were searched. Please check Results Review for a complete set of results.    Social History   Tobacco Use  Smoking Status Former   Current packs/day: 0.00   Average packs/day: 0.5 packs/day for 40.0 years (20.0 ttl pk-yrs)   Types: Cigarettes   Start date: 34   Quit date: 04/2018   Years since quitting: 6.5   Passive exposure: Never  Smokeless Tobacco Never  Tobacco Comments   Don't Smoke    Goals Met:  Exercise tolerated well No report of concerns or symptoms today Strength training completed today  Goals Unmet:  Not Applicable  Comments: Service time is from 1006 to 1139.    Dr. Slater Staff is Medical Director for Pulmonary Rehab at [facility].

## 2024-10-21 ENCOUNTER — Other Ambulatory Visit

## 2024-10-21 ENCOUNTER — Telehealth: Payer: Self-pay

## 2024-10-21 DIAGNOSIS — Z006 Encounter for examination for normal comparison and control in clinical research program: Secondary | ICD-10-CM

## 2024-10-21 NOTE — Telephone Encounter (Signed)
 CMN received from [employer] regarding pt's cpap accessories. (Humidity, and pressure) This is only requiring a provider signature. Once this has been signed, I will fax.

## 2024-10-22 ENCOUNTER — Encounter (HOSPITAL_COMMUNITY)
Admission: RE | Admit: 2024-10-22 | Discharge: 2024-10-22 | Disposition: A | Discharge status: Home / Self Care | Source: Ambulatory Visit | Attending: Pulmonary Disease | Admitting: Pulmonary Disease

## 2024-10-22 ENCOUNTER — Other Ambulatory Visit (HOSPITAL_COMMUNITY): Payer: Self-pay

## 2024-10-22 DIAGNOSIS — J449 Chronic obstructive pulmonary disease, unspecified: Secondary | ICD-10-CM | POA: Diagnosis not present

## 2024-10-22 NOTE — Progress Notes (Signed)
 Daily Session Note  Patient Details  Name: [name] MRN: [id number] Date of Birth: 1961/07/23 Referring Provider:   Conrad Ports Pulmonary Rehab Walk Test from 07/10/2024 in [facility]  Referring Provider Dr. Fairy Danker ([facility])    Encounter Date: 10/22/2024  Check In:  Session Check In - 10/22/24 1151       Check-In   Supervising physician immediately available to respond to emergencies [facility] MD immediately available    Physician(s) Orren Fabry, NP    Location [facility]    Staff Present [name], MS, ACSM-CEP, Exercise Physiologist;Kierra Jezewski Claudene Maya Koyanagi, RN, Mallory Parkins, MS, ACSM-CEP, CCRP, Exercise Physiologist;Maria Cyrus, RN, BSN;Johnny Porter, MS, Exercise Physiologist    Virtual Visit No    Medication changes reported     No    Fall or balance concerns reported    No    Tobacco Cessation No Change    Warm-up and Cool-down Performed as group-led instruction    Resistance Training Performed Yes    VAD Patient? No    PAD/SET Patient? No      Pain Assessment   Currently in Pain? No/denies    Multiple Pain Sites No          Capillary Blood Glucose: Results for orders placed or performed in visit on 10/21/24 (from the past 24 hours)  [facility] Molecular Screen - Blood     Status: None (In process)   Collection Time: 10/21/24  1:40 PM  Result Value Ref Range   [facility] Molecular Screen-Blood Received    *Note: Due to a large number of results and/or encounters for the requested time period, some results have not been displayed. A complete set of results can be found in Results Review.      Social History   Tobacco Use  Smoking Status Former   Current packs/day: 0.00   Average packs/day: 0.5 packs/day for 40.0 years (20.0 ttl pk-yrs)   Types: Cigarettes   Start date: 74   Quit date: 04/2018   Years since quitting: 6.5   Passive exposure:  Never  Smokeless Tobacco Never  Tobacco Comments   Don't Smoke    Goals Met:  Proper associated with RPD/PD & O2 Sat Independence with exercise equipment Exercise tolerated well No report of concerns or symptoms today Strength training completed today  Goals Unmet:  Not Applicable  Comments: Service time is from 1006 to 1139.    Dr. Slater Staff is Medical Director for Pulmonary Rehab at [facility].

## 2024-10-24 ENCOUNTER — Other Ambulatory Visit: Payer: Self-pay | Admitting: Allergy & Immunology

## 2024-10-24 DIAGNOSIS — J441 Chronic obstructive pulmonary disease with (acute) exacerbation: Secondary | ICD-10-CM

## 2024-10-26 NOTE — Telephone Encounter (Signed)
 This has been signed and faxed. NFN

## 2024-10-27 ENCOUNTER — Encounter (HOSPITAL_COMMUNITY)
Admission: RE | Admit: 2024-10-27 | Discharge: 2024-10-27 | Disposition: A | Discharge status: Home / Self Care | Source: Ambulatory Visit | Attending: Pulmonary Disease | Admitting: Pulmonary Disease

## 2024-10-27 ENCOUNTER — Encounter

## 2024-10-27 VITALS — Wt 188.1 lb

## 2024-10-27 DIAGNOSIS — J449 Chronic obstructive pulmonary disease, unspecified: Secondary | ICD-10-CM | POA: Diagnosis not present

## 2024-10-27 NOTE — Progress Notes (Signed)
 Daily Session Note  Patient Details  Name: [name] MRN: [id number] Date of Birth: 1961-06-01 Referring Provider:   Conrad Ports Pulmonary Rehab Walk Test [facility]10/[facility]  Referring Provider Dr. Fairy Danker ([facility])    Encounter Date: 10/27/2024  Check In:  Session Check In - 10/27/24 1109       Check-In   Supervising physician immediately available to respond to emergencies [facility] MD immediately available    Physician(s) Barnie Press, NP    Location [facility]    Staff Present [name], MS, ACSM-CEP, Exercise Physiologist;Casey Claudene Unknown Candy, MS, Exercise Physiologist;Mary Harvy, RN, BSN;Krisa Blattner BS, ACSM-CEP, Exercise Physiologist    Virtual Visit No    Medication changes reported     No    Fall or balance concerns reported    No    Tobacco Cessation No Change    Warm-up and Cool-down Performed as group-led instruction    Resistance Training Performed Yes    VAD Patient? No    PAD/SET Patient? No      Pain Assessment   Currently in Pain? No/denies    Multiple Pain Sites No          Capillary Blood Glucose: No results found. However, due to the size of the patient record, not all encounters were searched. Please check Results Review for a complete set of results.   Exercise Prescription Changes - 10/27/24 1200       Response to Exercise   Blood Pressure (Admit) 128/82    Blood Pressure (Exit) 128/80    Heart Rate (Admit) 81 bpm    Heart Rate (Exercise) 105 bpm    Heart Rate (Exit) 82 bpm    Oxygen  Saturation (Admit) 99 %    Oxygen  Saturation (Exercise) 93 %    Oxygen  Saturation (Exit) 100 %    Rating of Perceived Exertion (Exercise) 9    Perceived Dyspnea (Exercise) 0    Duration Continue with 30 min of aerobic exercise without signs/symptoms of physical distress.    Intensity THRR unchanged      Progression   Progression  Continue to progress workloads to maintain intensity without signs/symptoms of physical distress.      Resistance Training   Training Prescription Yes    Weight red bands    Reps 10-15    Time 10 Minutes      Treadmill   MPH 1.5    Grade 1    Minutes 15    METs 2.2      NuStep   Level 4    Minutes 15    METs 1.7          Social History   Tobacco Use  Smoking Status Former   Current packs/day: 0.00   Average packs/day: 0.5 packs/day for 40.0 years (20.0 ttl pk-yrs)   Types: Cigarettes   Start date: 19   Quit date: 04/2018   Years since quitting: 6.5   Passive exposure: Never  Smokeless Tobacco Never  Tobacco Comments   Don't Smoke    Goals Met:  Exercise tolerated well No report of concerns or symptoms today Strength training completed today  Goals Unmet:  Not Applicable  Comments: Service time is from 1004 to 1135.    Dr. Slater Staff is Medical Director for Pulmonary Rehab at [facility].

## 2024-10-28 ENCOUNTER — Encounter: Payer: Self-pay | Admitting: Pharmacist

## 2024-10-28 ENCOUNTER — Ambulatory Visit

## 2024-10-28 DIAGNOSIS — J4550 Severe persistent asthma, uncomplicated: Secondary | ICD-10-CM | POA: Diagnosis not present

## 2024-10-28 NOTE — Progress Notes (Unsigned)
 Pharmacy Quality Measure Review  This patient is appearing on a report for being at risk of failing the adherence measure for hypertension (ACEi/ARB) medications this calendar year.   Medication: irbesartan  Last fill date: 10/20/24 for 90 day supply  Insurance report was not up to date. No action needed at this time.   Darrelyn Drum, PharmD, BCPS, CPP Clinical Pharmacist Practitioner [facility] at [facility] [phone]

## 2024-10-29 ENCOUNTER — Ambulatory Visit: Admitting: Professional

## 2024-10-29 ENCOUNTER — Encounter (HOSPITAL_COMMUNITY)
Admission: RE | Admit: 2024-10-29 | Discharge: 2024-10-29 | Disposition: A | Discharge status: Home / Self Care | Source: Ambulatory Visit | Attending: Pulmonary Disease | Admitting: Pulmonary Disease

## 2024-10-29 DIAGNOSIS — J449 Chronic obstructive pulmonary disease, unspecified: Secondary | ICD-10-CM

## 2024-10-29 NOTE — Progress Notes (Signed)
 Daily Session Note  Patient Details  Name: [name] MRN: [id number] Date of Birth: 07/25/1961 Referring Provider:   Conrad Ports Pulmonary Rehab Walk Test from 07/10/2024 in [facility]  Referring Provider Dr. Fairy Danker ([facility])    Encounter Date: 10/29/2024  Check In:  Session Check In - 10/29/24 1137       Check-In   Supervising physician immediately available to respond to emergencies [facility] MD immediately available    Physician(s) Josefa Beauvais, NP    [facility]    Staff Present [name], MS, ACSM-CEP, Exercise Physiologist;Casey Claudene Candia Levin, RN, BSN;Theopolis Sloop BS, ACSM-CEP, Exercise Physiologist    Virtual Visit No    Medication changes reported     No    Fall or balance concerns reported    No    Tobacco Cessation No Change    Warm-up and Cool-down Performed as group-led instruction    Resistance Training Performed Yes    VAD Patient? No    PAD/SET Patient? No      Pain Assessment   Currently in Pain? No/denies    Multiple Pain Sites No          Capillary Blood Glucose: No results found. However, due to the size of the patient record, not all encounters were searched. Please check Results Review for a complete set of results.    Social History   Tobacco Use  Smoking Status Former   Current packs/day: 0.00   Average packs/day: 0.5 packs/day for 40.0 years (20.0 ttl pk-yrs)   Types: Cigarettes   Start date: 77   Quit date: 04/2018   Years since quitting: 6.5   Passive exposure: Never  Smokeless Tobacco Never  Tobacco Comments   Don't Smoke    Goals Met:  Exercise tolerated well No report of concerns or symptoms today Strength training completed today  Goals Unmet:  Not Applicable  Comments: Service time is from 1000 to 1141.    Dr. Slater Staff is Medical Director for Pulmonary Rehab at [facility].

## 2024-10-30 ENCOUNTER — Ambulatory Visit: Admitting: Internal Medicine

## 2024-10-30 ENCOUNTER — Encounter: Payer: Self-pay | Admitting: Internal Medicine

## 2024-10-30 VITALS — BP 128/80 | HR 80 | Temp 97.9°F | Ht 63.0 in | Wt 189.0 lb

## 2024-10-30 DIAGNOSIS — D72829 Elevated white blood cell count, unspecified: Secondary | ICD-10-CM

## 2024-10-30 DIAGNOSIS — R7303 Prediabetes: Secondary | ICD-10-CM | POA: Diagnosis not present

## 2024-10-30 DIAGNOSIS — B370 Candidal stomatitis: Secondary | ICD-10-CM | POA: Diagnosis not present

## 2024-10-30 DIAGNOSIS — E559 Vitamin D deficiency, unspecified: Secondary | ICD-10-CM

## 2024-10-30 DIAGNOSIS — R252 Cramp and spasm: Secondary | ICD-10-CM

## 2024-10-30 MED ORDER — NYSTATIN 100000 UNIT/ML MT SUSP: 500000.0000 [IU] | Freq: Four times a day (QID) | OROMUCOSAL | 0 refills | Status: AC

## 2024-10-30 NOTE — Progress Notes (Signed)
 Patient ID: [name], female   DOB: 01-14-1961, 63 y.o.   MRN: [id number]        Chief Complaint: follow up post ED visit oct 31 with cramping arm, leukocytosis, thrush, htn, low vit d       HPI:  [name] is a 63 y.o. female here after ED visit with left am prolonged cramping, tx with muscle relaxer now resolved and non recurrent.  Denies left neck or radicular pain.   Pt denies fever, wt loss, night sweats, loss of appetite, or other constitutional symptoms  Pt denies chest pain, increased sob or doe, wheezing, orthopnea, PND, increased LE swelling, palpitations, dizziness or syncope.  Does have rash to tongue worsening with mild discomfort on recent steroid and inhalers where she admits to not rinsing every time. Also has persistent elevated WBC after steroid tx, but has been advised to f/u hematology       Wt Readings from Last 3 Encounters:  10/30/24 189 lb (85.7 kg)  10/27/24 188 lb 0.8 oz (85.3 kg)  10/16/24 186 lb 15.2 oz (84.8 kg)   BP Readings from Last 3 Encounters:  10/30/24 128/80  10/16/24 128/85  10/09/24 120/78         Past Medical History:  Diagnosis Date   Allergy     Anemia, iron  deficiency 12/22/2014   pt. 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      Asthma    Bipolar disorder (HCC)    with anxiety, depression   COPD (chronic obstructive pulmonary disease) (HCC)    Depression    Eczema 05/18/2022   Emphysema of lung (HCC)    Fibromyalgia 05/14/2014   GERD (gastroesophageal reflux disease)    History of kidney stones    Hyperlipidemia 04/20/2017   HYPERTENSION 05/12/2009   Qualifier: Diagnosis of  By: Primus LATHER LEODIS), [name]     Osteoporosis    Peripheral vascular disease    Pneumonia    Prediabetes 02/23/2014   pt.  denies   Seizure (HCC)    Sleep apnea    Stroke (HCC) 11/2020   Urticaria    Vitamin D  deficiency    Past Surgical History:  Procedure Laterality Date   ABDOMINAL HYSTERECTOMY     ANTERIOR CERVICAL DECOMP/DISCECTOMY FUSION N/A 07/28/2020   Procedure: ANTERIOR CERVICAL DECOMPRESSION/DISCECTOMY FUSION. INTERBODY PROTHESIS, PLATE/SCREWS CERVICAL THREE- CERVICAL FOUR, CERVICAL FOUR- CERVICAL FIVE;  Surgeon: Mavis Purchase, MD;  Location[facility];  Service: Neurosurgery;  Laterality: N/A;   BACK SURGERY     COLONOSCOPY  12/20/2011   Procedure: COLONOSCOPY;  Surgeon: Elsie CHRISTELLA Cree, MD;  Location: [facility] ENDOSCOPY;  Service: Endoscopy;  Laterality: N/A;   COLONOSCOPY  03/05/2012   Procedure: COLONOSCOPY;  Surgeon: Elsie CHRISTELLA Cree, MD;  Location: [facility] ENDOSCOPY;  Service: Endoscopy;  Laterality: N/A;   DIAGNOSTIC LAPAROSCOPY     ESOPHAGOGASTRODUODENOSCOPY N/A 10/01/2024   Procedure: EGD (ESOPHAGOGASTRODUODENOSCOPY);  Surgeon: Nandigam, Kavitha V, MD;  Location: [facility] ENDOSCOPY;  Service: Gastroenterology;  Laterality: N/A;   ESOPHAGOGASTRODUODENOSCOPY (EGD) WITH PROPOFOL  N/A 10/31/2023   Procedure: ESOPHAGOGASTRODUODENOSCOPY (EGD) WITH PROPOFOL ;  Surgeon: Legrand Victory LITTIE DOUGLAS, MD;  Location: [facility] ENDOSCOPY;  Service: Gastroenterology;  Laterality: N/A;   HEMORRHOID SURGERY     INCISE AND DRAIN ABCESS     KIDNEY STONE SURGERY  NECK SURGERY     x 2 Dr JINNY Budge   SPINE SURGERY  2019   TOE SURGERY     TUBAL LIGATION      reports that she quit smoking about 6 years ago. Her smoking use included cigarettes. She started smoking about 46 years ago. She has a 20 pack-year smoking history. She has never been exposed to tobacco smoke. She has never used smokeless tobacco. She reports that she does not currently use alcohol . She reports that she does not currently use drugs after having used the following drugs: Marijuana. family history includes Alcohol  abuse in her brother and father; Anemia in her  daughter, sister, and sister; Anesthesia problems in her father; Asthma in her brother, brother, mother, and sister; Brain cancer in her sister; Breast cancer (age of onset: 74) in her sister; COPD in her brother, mother, and sister; Colon cancer (age of onset: 50) in her father; Depression in her brother; Diabetes in her brother, father, mother, sister, sister, sister, and sister; Heart disease in her brother, brother, mother, and sister; Hypertension in her brother, brother, daughter, sister, sister, sister, sister, and sister; Kidney disease in her father; Sleep apnea in her brother. Allergies  Allergen Reactions   Sulfa Antibiotics Nausea And Vomiting and Other (See Comments)   Venofer  [Iron  Sucrose] Hives   Current Outpatient Medications on File Prior to Visit  Medication Sig Dispense Refill   AIRSUPRA  90-80 MCG/ACT AERO SMARTSIG:2 Puff(s) Via Inhaler 4 Times Daily PRN     albuterol  (PROVENTIL ) (2.5 MG/3ML) 0.083% nebulizer solution USE 1 VIAL VIA NEBULIZER EVERY 4 HOURS AS NEEDED FOR WHEEZING OR SHORTNESS OF BREATH 75 mL 3   albuterol  (VENTOLIN  HFA) 108 (90 Base) MCG/ACT inhaler INHALE 2 PUFFS INTO THE LUNGS EVERY 6 HOURS AS NEEDED FOR WHEEZING OR SHORTNESS OF BREATH 18 g 3   amLODipine  (NORVASC ) 5 MG tablet Take 1 tablet (5 mg total) by mouth daily. 90 tablet 3   atorvastatin  (LIPITOR) 80 MG tablet Take 1 tablet (80 mg total) by mouth daily. 90 tablet 1   azelastine  (OPTIVAR ) 0.05 % ophthalmic solution Place 1 drop into both eyes 2 (two) times daily. 6 mL 12   BREZTRI  AEROSPHERE 160-9-4.8 MCG/ACT AERO inhaler INHALE 2 PUFFS INTO THE LUNGS IN THE MORNING AND AT BEDTIME 10.7 g 11   budesonide  (PULMICORT ) 0.5 MG/2ML nebulizer solution USE 2 ML(0.5 MG) VIA NEBULIZER IN THE MORNING AND AT BEDTIME 120 mL 2   carbamazepine  (TEGRETOL ) 200 MG tablet Take 2-3 tablets (400-600 mg total) by mouth See admin instructions. Take 400mg  in the morning and 600mg  at bedtime. 150 tablet 2   cetirizine  (ZYRTEC ) 10  MG tablet Take 10 mg by mouth daily.     cromolyn  (OPTICROM ) 4 % ophthalmic solution Place 1 drop into both eyes 4 (four) times daily. 10 mL 12   doxepin  (SINEQUAN ) 25 MG capsule TAKE 1 CAPSULE(25 MG) BY MOUTH AT BEDTIME AS NEEDED 30 capsule 5   Ensifentrine  (OHTUVAYRE ) 3 MG/2.5ML SUSP Inhale 3 mg into the lungs in the morning and at bedtime. 150 mL 11   estradiol  (ESTRACE ) 0.01 % CREA vaginal cream PLACE 0.5 GRAMS VAGINALLY NIGHTLY FOR 2 WEEKS, THEN TWICE A WEEK AFTER 42.5 g 2   famotidine  (PEPCID ) 40 MG tablet Take 1 tablet (40 mg total) by mouth 2 (two) times daily. 60 tablet 5   ferrous sulfate  325 (65 FE) MG EC tablet Take 1 tablet (325 mg total) by mouth daily with breakfast. 30 tablet  1   fluticasone  (FLONASE ) 50 MCG/ACT nasal spray SHAKE LIQUID AND USE 1 SPRAY IN EACH NOSTRIL TWICE DAILY AS NEEDED FOR ALLERGIES OR RHINITIS 16 g 5   formoterol  (PERFOROMIST ) 20 MCG/2ML nebulizer solution Take 20 mcg by nebulization 2 (two) times daily.     furosemide  (LASIX ) 20 MG tablet TAKE 1 TABLET BY MOUTH DAILY AS NEEDED 30 tablet 5   hydrOXYzine  (VISTARIL ) 25 MG capsule Take 1 capsule (25 mg total) by mouth at bedtime as needed for itching.     ipratropium (ATROVENT  HFA) 17 MCG/ACT inhaler Inhale 2 puffs into the lungs every 4 (four) hours as needed for wheezing. 1 each 5   irbesartan  (AVAPRO ) 150 MG tablet Take 1 tablet (150 mg total) by mouth daily. 90 tablet 3   levocetirizine (XYZAL ) 5 MG tablet Take 1 tablet (5 mg total) by mouth every evening.     lidocaine  (XYLOCAINE ) 5 % ointment Apply 1 Application topically as needed. 35.44 g 0   meclizine  (ANTIVERT ) 12.5 MG tablet Take 12.5 mg by mouth 3 (three) times daily as needed for dizziness.     methocarbamol (ROBAXIN) 500 MG tablet Take 1 tablet (500 mg total) by mouth 2 (two) times daily. 20 tablet 0   montelukast  (SINGULAIR ) 10 MG tablet TAKE 1 TABLET(10 MG) BY MOUTH AT BEDTIME 90 tablet 1   naproxen (NAPROSYN) 500 MG tablet Take 1 tablet (500 mg  total) by mouth 2 (two) times daily. 30 tablet 0   omeprazole  (PRILOSEC) 40 MG capsule Take 1 capsule (40 mg total) by mouth daily. 90 capsule 3   ondansetron  (ZOFRAN -ODT) 4 MG disintegrating tablet DISSOLVE 1 TABLET(4 MG) ON THE TONGUE EVERY 8 HOURS AS NEEDED FOR NAUSEA OR VOMITING 30 tablet 1   pantoprazole  (PROTONIX ) 40 MG tablet Take 40 mg by mouth daily.     Suvorexant  (BELSOMRA ) 15 MG TABS Take by mouth.     tacrolimus  (PROTOPIC ) 0.03 % ointment Apply 1 Application topically 2 (two) times daily as needed (rash). 60 g 5   triamcinolone  ointment (KENALOG ) 0.1 % Apply 1 Application topically at bedtime. 30 g 5   Vibegron  (GEMTESA ) 75 MG TABS Take 1 tablet (75 mg total) by mouth daily. 30 tablet 11   predniSONE  (DELTASONE ) 10 MG tablet 3 tabs by mouth per day for 3 days,2tabs per day for 3 days,1tab per day for 3 days (Patient not taking: Reported on 10/30/2024) 18 tablet 0   Current Facility-Administered Medications on File Prior to Visit  Medication Dose Route Frequency Provider Last Rate Last Admin   benralizumab  (FASENRA ) prefilled syringe 30 mg  30 mg Subcutaneous Q8 Weeks Gallagher, Joel Louis, MD   30 mg at 10/28/24 1420        ROS:  All others reviewed and negative.  Objective        PE:  BP 128/80 (BP Location: Right Arm, Patient Position: Sitting, Cuff Size: Normal)   Pulse 80   Temp 97.9 F (36.6 C) (Oral)   Ht 5' 3 (1.6 m)   Wt 189 lb (85.7 kg)   SpO2 98%   BMI 33.48 kg/m                 Constitutional: Pt appears in NAD               HENT: Head: NCAT.                Right Ear: External ear normal.  Left Ear: External ear normal.                Eyes: . Pupils are equal, round, and reactive to light. Conjunctivae and EOM are normal               Nose: without d/c or deformity               Neck: Neck supple. Gross normal ROM               Cardiovascular: Normal rate and regular rhythm.                 Pulmonary/Chest: Effort normal and breath sounds  without rales or wheezing.                Abd:  Soft, NT, ND, + BS, no organomegaly               Neurological: Pt is alert. At baseline orientation, motor grossly intact               Skin: Skin is warm. No rashes, no other new lesions, LE edema - none               Psychiatric: Pt behavior is normal without agitation   Micro: none  Cardiac tracings I have personally interpreted today:  none  Pertinent Radiological findings (summarize): none   Lab Results  Component Value Date   WBC 20.3 (H) 10/16/2024   HGB 10.6 (L) 10/16/2024   HCT 35.8 (L) 10/16/2024   PLT 499 (H) 10/16/2024   GLUCOSE 91 10/16/2024   CHOL 171 12/25/2023   TRIG 64.0 12/25/2023   HDL 77.50 12/25/2023   LDLCALC 80 12/25/2023   ALT 14 10/09/2024   AST 14 10/09/2024   NA 140 10/16/2024   K 4.3 10/16/2024   CL 105 10/16/2024   CREATININE 0.83 10/16/2024   BUN 12 10/16/2024   CO2 23 10/16/2024   TSH 0.53 12/25/2023   INR 0.9 03/14/2023   HGBA1C 6.0 12/25/2023   Assessment/Plan:  Shamel Galyean is a 63 y.o. Black or African American [2] female with  has a past medical history of Allergy , Anemia, iron  deficiency (12/22/2014), Anxiety, Arthritis, ASTHMA (05/12/2009), Asthma, Bipolar disorder (HCC), COPD (chronic obstructive pulmonary disease) (HCC), Depression, Eczema (05/18/2022), Emphysema of lung (HCC), Fibromyalgia (05/14/2014), GERD (gastroesophageal reflux disease), History of kidney stones, Hyperlipidemia (04/20/2017), HYPERTENSION (05/12/2009), Osteoporosis, Peripheral vascular disease, Pneumonia, Prediabetes (02/23/2014), Seizure (HCC), Sleep apnea, Stroke (HCC) (11/2020), Urticaria, and Vitamin D  deficiency.  Muscle cramping Resolved, advised to remain hydrated and avoid overuse  Leukocytosis ? Steroid related, but pt to f/u hematology with referral  Oral candidiasis Recurrent, pt advised for mouth rinse every time with inhaler use, now for nystatin  soln qid asd  Vitamin D   deficiency Last vitamin D  Lab Results  Component Value Date   VD25OH 36.15 12/25/2023   Low, to start oral replacement   Prediabetes Lab Results  Component Value Date   HGBA1C 6.0 12/25/2023   Stable, pt to continue current medical treatment  - diet, wt control  Followup: Return in about 6 months (around 04/29/2025).  Lynwood Rush, MD 10/31/2024 4:07 PM [facility]

## 2024-10-30 NOTE — Patient Instructions (Signed)
 Please take all new medication as prescribed - the mouth solution for thrush  Please continue all other medications as before, and refills have been done if requested.  Please have the pharmacy call with any other refills you may need  Please keep your appointments with your specialists as you may have planned - hematology, pulmonary  We can hold on lab test today  Please make an Appointment to return in 6 months, or sooner if needed

## 2024-10-31 ENCOUNTER — Encounter: Payer: Self-pay | Admitting: Internal Medicine

## 2024-10-31 DIAGNOSIS — B370 Candidal stomatitis: Secondary | ICD-10-CM | POA: Insufficient documentation

## 2024-10-31 DIAGNOSIS — R252 Cramp and spasm: Secondary | ICD-10-CM | POA: Insufficient documentation

## 2024-10-31 NOTE — Assessment & Plan Note (Addendum)
?   Steroid related, but pt to f/u hematology with referral

## 2024-10-31 NOTE — Assessment & Plan Note (Signed)
 Last vitamin D Lab Results  Component Value Date   VD25OH 36.15 12/25/2023   Low, to start  oral replacement

## 2024-10-31 NOTE — Assessment & Plan Note (Signed)
 Recurrent, pt advised for mouth rinse every time with inhaler use, now for nystatin  soln qid asd

## 2024-10-31 NOTE — Assessment & Plan Note (Signed)
 Lab Results  Component Value Date   HGBA1C 6.0 12/25/2023   Stable, pt to continue current medical treatment - diet, wt control

## 2024-10-31 NOTE — Assessment & Plan Note (Signed)
 Resolved, advised to remain hydrated and avoid overuse

## 2024-11-02 LAB — GENECONNECT MOLECULAR SCREEN: Genetic Analysis Overall Interpretation: NEGATIVE

## 2024-11-03 ENCOUNTER — Encounter (HOSPITAL_COMMUNITY)

## 2024-11-03 ENCOUNTER — Other Ambulatory Visit (HOSPITAL_COMMUNITY): Payer: Self-pay

## 2024-11-04 ENCOUNTER — Telehealth: Payer: Self-pay

## 2024-11-04 NOTE — Telephone Encounter (Signed)
 Paper received from [employer] regarding pt's CPAP supplies. This is only requiring a provider signature. I will fax once this is signed.

## 2024-11-05 ENCOUNTER — Telehealth (HOSPITAL_COMMUNITY): Payer: Self-pay | Admitting: *Deleted

## 2024-11-05 ENCOUNTER — Encounter: Payer: Self-pay | Admitting: Internal Medicine

## 2024-11-05 ENCOUNTER — Encounter (HOSPITAL_COMMUNITY): Admission: RE | Admit: 2024-11-05 | Source: Ambulatory Visit

## 2024-11-05 NOTE — Progress Notes (Addendum)
 Pharmacy Quality Measure Review  This patient is appearing on a report for being at risk of failing the adherence measure for hypertension (ACEi/ARB) medications this calendar year.   Medication: Irbesartan  150mg  Last fill date: 11/04 for 90 day supply  Insurance report was not up to date. No action needed at this time.   This patient is appearing on a report for being at risk of failing the adherence measure for cholesterol (statin) medications this calendar year.   Medication: Atorvastatin  80mg  Last fill date: 08/06 for 90 day supply  Spoke to patient, states she has sufficient supply, takes it daily with no issues. All questions were answered at this time.  Angela Baalmann, PharmD [facility] Pharmacist

## 2024-11-05 NOTE — Telephone Encounter (Signed)
 Received voicemail on departmental line.  Pt thinks she may have the flu and will not be in today for Pulmonary Rehab. Hopes to return next week. Saturnino Bernett PEAK, BSN Cardiac and Emergency Planning/management Officer

## 2024-11-08 DIAGNOSIS — J449 Chronic obstructive pulmonary disease, unspecified: Secondary | ICD-10-CM | POA: Diagnosis not present

## 2024-11-08 DIAGNOSIS — J4540 Moderate persistent asthma, uncomplicated: Secondary | ICD-10-CM | POA: Diagnosis not present

## 2024-11-08 DIAGNOSIS — D509 Iron deficiency anemia, unspecified: Secondary | ICD-10-CM | POA: Diagnosis not present

## 2024-11-08 DIAGNOSIS — R5383 Other fatigue: Secondary | ICD-10-CM | POA: Diagnosis not present

## 2024-11-08 DIAGNOSIS — F319 Bipolar disorder, unspecified: Secondary | ICD-10-CM | POA: Diagnosis not present

## 2024-11-08 DIAGNOSIS — G4733 Obstructive sleep apnea (adult) (pediatric): Secondary | ICD-10-CM | POA: Diagnosis not present

## 2024-11-09 ENCOUNTER — Ambulatory Visit
Admission: RE | Admit: 2024-11-09 | Discharge: 2024-11-09 | Disposition: A | Discharge status: Home / Self Care | Source: Ambulatory Visit | Attending: Internal Medicine | Admitting: Internal Medicine

## 2024-11-09 DIAGNOSIS — Z1231 Encounter for screening mammogram for malignant neoplasm of breast: Secondary | ICD-10-CM

## 2024-11-09 NOTE — Telephone Encounter (Signed)
 SIGNED AND FAXED. nfn

## 2024-11-10 ENCOUNTER — Encounter (HOSPITAL_COMMUNITY)
Admission: RE | Admit: 2024-11-10 | Discharge: 2024-11-10 | Disposition: A | Source: Ambulatory Visit | Attending: Pulmonary Disease

## 2024-11-10 VITALS — Wt 186.5 lb

## 2024-11-10 DIAGNOSIS — J449 Chronic obstructive pulmonary disease, unspecified: Secondary | ICD-10-CM

## 2024-11-10 NOTE — Progress Notes (Signed)
 Daily Session Note  Patient Details  Name: [name] MRN: [id number] Date of Birth: [birth year] Referring Provider:   Conrad Ports Pulmonary Rehab Walk Test from 07/10/2024 in [facility] for [facility]  Referring Provider Dr. Fairy Danker ([facility])    Encounter Date: 11/10/2024  Check In:  Session Check In - 11/10/24 1131       Check-In   Supervising physician immediately available to respond to emergencies [facility] MD immediately available    Physician(s) Rosabel Mose, NP    Location [facility]    Staff Present [name], MS, ACSM-CEP, Exercise Physiologist;Casey Claudene Candia Levin, RN, BSN;Klayton Monie BS, ACSM-CEP, Exercise Physiologist    Virtual Visit No    Medication changes reported     No    Fall or balance concerns reported    No    Tobacco Cessation No Change    Warm-up and Cool-down Performed as group-led instruction    Resistance Training Performed Yes    VAD Patient? No    PAD/SET Patient? No      Pain Assessment   Currently in Pain? No/denies    Multiple Pain Sites No          Capillary Blood Glucose: No results found. However, due to the size of the patient record, not all encounters were searched. Please check Results Review for a complete set of results.   Exercise Prescription Changes - 11/10/24 1200       Response to Exercise   Blood Pressure (Admit) 128/76    Blood Pressure (Exercise) 144/80    Blood Pressure (Exit) 110/70    Heart Rate (Admit) 75 bpm    Heart Rate (Exercise) 113 bpm    Heart Rate (Exit) 87 bpm    Oxygen  Saturation (Admit) 98 %    Oxygen  Saturation (Exercise) 93 %    Oxygen  Saturation (Exit) 96 %    Rating of Perceived Exertion (Exercise) 12    Perceived Dyspnea (Exercise) 1    Duration Continue with 30 min of aerobic exercise without signs/symptoms of physical distress.    Intensity THRR unchanged      Progression   Progression Continue to  progress workloads to maintain intensity without signs/symptoms of physical distress.      Resistance Training   Training Prescription Yes    Weight red bands    Reps 10-15    Time 10 Minutes      Interval Training   Interval Training No      Treadmill   MPH 1.8    Grade 1    Minutes 15    METs 2.5      NuStep   Level 4    SPM 64    Minutes 15    METs 2.1          Social History   Tobacco Use  Smoking Status Former   Current packs/day: 0.00   Average packs/day: 0.5 packs/day for 40.0 years (20.0 ttl pk-yrs)   Types: Cigarettes   Start date: 33   Quit date: 04/2018   Years since quitting: 6.5   Passive exposure: Never  Smokeless Tobacco Never  Tobacco Comments   Don't Smoke    Goals Met:  Exercise tolerated well No report of concerns or symptoms today Strength training completed today  Goals Unmet:  Not Applicable  Comments: Service time is from 1000 to 1138.    Dr. Slater Staff is Medical Director for Pulmonary Rehab  at [facility].

## 2024-11-15 DIAGNOSIS — R5383 Other fatigue: Secondary | ICD-10-CM | POA: Diagnosis not present

## 2024-11-15 DIAGNOSIS — F319 Bipolar disorder, unspecified: Secondary | ICD-10-CM | POA: Diagnosis not present

## 2024-11-15 DIAGNOSIS — J4540 Moderate persistent asthma, uncomplicated: Secondary | ICD-10-CM | POA: Diagnosis not present

## 2024-11-15 DIAGNOSIS — D509 Iron deficiency anemia, unspecified: Secondary | ICD-10-CM | POA: Diagnosis not present

## 2024-11-15 DIAGNOSIS — G4733 Obstructive sleep apnea (adult) (pediatric): Secondary | ICD-10-CM | POA: Diagnosis not present

## 2024-11-15 DIAGNOSIS — J449 Chronic obstructive pulmonary disease, unspecified: Secondary | ICD-10-CM | POA: Diagnosis not present

## 2024-11-16 ENCOUNTER — Ambulatory Visit: Admitting: Gastroenterology

## 2024-11-16 DIAGNOSIS — D509 Iron deficiency anemia, unspecified: Secondary | ICD-10-CM | POA: Diagnosis not present

## 2024-11-16 DIAGNOSIS — K922 Gastrointestinal hemorrhage, unspecified: Secondary | ICD-10-CM | POA: Diagnosis not present

## 2024-11-16 NOTE — Progress Notes (Signed)
 CAPSULE ID: [id number] Exp: 2025-12-18  LOT: [id number]  Patient arrived for capsule endoscopy. Reported the prep went well. Confirmed patient is fasting. Explained dietary restrictions for the next few hours. Written instructions provided. Patient verbalized understanding. Opened capsule, ensured capsule was flashing and transmitting to the recorder prior to the patient swallowing the capsule. Patient swallowed capsule without difficulty. Patient told to call the office with any questions. Understands to return to the office today between 4:00 and 4:30 pm. No further questions by the conclusion of the visit.

## 2024-11-16 NOTE — Progress Notes (Signed)
 Discharge Progress Report  Patient Details  Name: [name] MRN: [id number] Date of Birth: [birth year] Referring Provider:   [facility] Walk Test from 07/10/2024 in [facility]  Referring Provider Dr. Fairy Danker ([facility])     Number of Visits: 19  Reason for Discharge:  Patient has met program and personal goals.  Smoking History:  Social History   Tobacco Use  Smoking Status Former   Current packs/day: 0.00   Average packs/day: 0.5 packs/day for 40.0 years (20.0 ttl pk-yrs)   Types: Cigarettes   Start date: 61   Quit date: 04/2018   Years since quitting: 6.5   Passive exposure: Never  Smokeless Tobacco Never  Tobacco Comments   Don't Smoke    Diagnosis:  Stage 3 severe COPD by GOLD classification (HCC)  ADL [facility]:  Pulmonary Assessment Scores     Row Name 07/10/24 1145 10/22/24 1229       ADL [facility]   ADL Phase Entry Exit    SOB Score total 88 67      CAT Score   CAT Score 32 23      mMRC Score   mMRC Score 3 --       Initial Exercise Prescription:  Initial Exercise Prescription - 07/10/24 1100       Date of Initial Exercise RX and Referring Provider   Date 07/10/24    Referring Provider Dr. Fairy Danker ([facility])    Expected Discharge Date 07/10/24      NuStep   Level 2    SPM 60    Minutes 15    METs 1.8      Track   Laps 15    Minutes 15    METs 2.04      Prescription Details   Frequency (times per week) 2    Duration Progress to 30 minutes of continuous aerobic without signs/symptoms of physical distress      Intensity   THRR 40-80% of Max Heartrate 64-127    Ratings of Perceived Exertion 11-13    Perceived Dyspnea 0-4      Progression   Progression Continue to progress workloads to maintain intensity without signs/symptoms of physical distress.      Resistance Training   Training Prescription Yes    Weight red bands    Reps 10-15           Discharge Exercise Prescription (Final Exercise Prescription Changes):  Exercise Prescription Changes - 11/10/24 1200       Response to Exercise   Blood Pressure (Admit) 128/76    Blood Pressure (Exercise) 144/80    Blood Pressure (Exit) 110/70    Heart Rate (Admit) 75 bpm    Heart Rate (Exercise) 113 bpm    Heart Rate (Exit) 87 bpm    Oxygen  Saturation (Admit) 98 %    Oxygen  Saturation (Exercise) 93 %    Oxygen  Saturation (Exit) 96 %    Rating of Perceived Exertion (Exercise) 12    Perceived Dyspnea (Exercise) 1    Duration Continue with 30 min of aerobic exercise without signs/symptoms of physical distress.    Intensity THRR unchanged      Progression   Progression Continue to progress workloads to maintain intensity without signs/symptoms of physical distress.      Resistance Training   Training Prescription Yes    Weight red bands    Reps 10-15    Time 10 Minutes  Interval Training   Interval Training No      Treadmill   MPH 1.8    Grade 1    Minutes 15    METs 2.5      NuStep   Level 4    SPM 64    Minutes 15    METs 2.1          Functional Capacity:  6 Minute Walk     Row Name 07/10/24 1130 11/10/24 1540       6 Minute Walk   Phase Initial Discharge    Distance 1020 feet 1375 feet    Distance % Change -- 34.8 %    Distance Feet Change -- 355 ft    Walk Time 6 minutes 6 minutes    # of Rest Breaks 1  desat to 85%, 2:36-2:44 92% after PLB 0    MPH 1.93 2.6    METS 2.65 3.55    RPE 11 13    Perceived Dyspnea  1 1    VO2 Peak 9.27 12.42    Symptoms Yes (comment) No    Comments dyspnea --    Resting HR 77 bpm 75 bpm    Resting BP 130/74 128/76    Resting Oxygen  Saturation  98 % 98 %    Exercise Oxygen  Saturation  during 6 min walk 85 %  O2 dropped to 85%, PLB up to 92% 93 %    Max Ex. HR 104 bpm 127 bpm    Max Ex. BP 144/80 144/80    2 Minute Post BP 120/76 132/76      Interval HR   1 Minute HR 93 104    2 Minute HR 99 123     3 Minute HR 101 127    4 Minute HR 102 127    5 Minute HR 101 127    6 Minute HR 104 127    2 Minute Post HR 72 94    Interval Heart Rate? Yes Yes      Interval Oxygen    Interval Oxygen ? Yes Yes    Baseline Oxygen  Saturation % 98 % 98 %    1 Minute Oxygen  Saturation % 95 % 98 %    1 Minute Liters of Oxygen  0 L 0 L    2 Minute Oxygen  Saturation % 88 %  2:36-2:44, desat to 85%, PLB and back up to 92% 95 %    2 Minute Liters of Oxygen  0 L 0 L    3 Minute Oxygen  Saturation % 92 % 96 %    3 Minute Liters of Oxygen  0 L 0 L    4 Minute Oxygen  Saturation % 96 % 93 %    4 Minute Liters of Oxygen  0 L 0 L    5 Minute Oxygen  Saturation % 95 % 94 %    5 Minute Liters of Oxygen  0 L 0 L    6 Minute Oxygen  Saturation % 98 % 94 %    6 Minute Liters of Oxygen  0 L 0 L    2 Minute Post Oxygen  Saturation % 97 % 98 %    2 Minute Post Liters of Oxygen  0 L 0 L       Psychological, QOL, Others - Outcomes: PHQ 2/9:    10/30/2024    2:17 PM 10/30/2024    2:09 PM 10/22/2024   12:20 PM 10/09/2024    2:02 PM 09/30/2024    1:13 PM  Depression screen PHQ 2/9  Decreased Interest 0 0 1 0 1  Down, Depressed, Hopeless 3 0 3 0 3  PHQ - 2 Score 3 0 4 0 4  Altered sleeping 3 0 3 0 3  Tired, decreased energy 3 0 3 0 3  Change in appetite 3 0 3 0 3  Feeling bad or failure about yourself  0 0 0 0 0  Trouble concentrating 3 0 0 0 3  Moving slowly or fidgety/restless 0 0 0 0 3  Suicidal thoughts 0 0 0 0 0  PHQ-9 Score 15 0 13 0  19   Difficult doing work/chores Not difficult at all Not difficult at all Not difficult at all Not difficult at all Somewhat difficult     Data saved with a previous flowsheet row definition    Quality of Life:   Personal Goals: Goals established at orientation with interventions provided to work toward goal.  Personal Goals and Risk Factors at Admission - 07/10/24 1032       Core Components/Risk Factors/Patient Goals on Admission    Weight Management Yes;Weight Loss     Intervention Weight Management: Develop a combined nutrition and exercise program designed to reach desired caloric intake, while maintaining appropriate intake of nutrient and fiber, sodium and fats, and appropriate energy expenditure required for the weight goal.;Weight Management: Provide education and appropriate resources to help participant work on and attain dietary goals.;Weight Management/Obesity: Establish reasonable short term and long term weight goals.;Obesity: Provide education and appropriate resources to help participant work on and attain dietary goals.    Expected Outcomes Short Term: Continue to assess and modify interventions until short term weight is achieved;Long Term: Adherence to nutrition and physical activity/exercise program aimed toward attainment of established weight goal;Weight Loss: Understanding of general recommendations for a balanced deficit meal plan, which promotes 1-2 lb weight loss per week and includes a negative energy balance of [phone] kcal/d;Understanding recommendations for meals to include 15-35% energy as protein, 25-35% energy from fat, 35-60% energy from carbohydrates, less than 200mg  of dietary cholesterol, 20-35 gm of total fiber daily;Understanding of distribution of calorie intake throughout the day with the consumption of 4-5 meals/snacks    Improve shortness of breath with ADL's Yes    Intervention Provide education, individualized exercise plan and daily activity instruction to help decrease symptoms of SOB with activities of daily living.    Expected Outcomes Short Term: Improve cardiorespiratory fitness to achieve a reduction of symptoms when performing ADLs;Long Term: Be able to perform more ADLs without symptoms or delay the onset of symptoms    Stress Yes    Intervention Offer individual and/or small group education and counseling on adjustment to heart disease, stress management and health-related lifestyle change. Teach and support self-help  strategies.;Refer participants experiencing significant psychosocial distress to appropriate mental health specialists for further evaluation and treatment. When possible, include family members and significant others in education/counseling sessions.    Expected Outcomes Short Term: Participant demonstrates changes in health-related behavior, relaxation and other stress management skills, ability to obtain effective social support, and compliance with psychotropic medications if prescribed.;Long Term: Emotional wellbeing is indicated by absence of clinically significant psychosocial distress or social isolation.           Personal Goals Discharge:  Goals and Risk Factor Review     Row Name 07/17/24 1253 08/12/24 9047 09/07/24 1224 10/05/24 1340 11/10/24 1608     Core Components/Risk Factors/Patient Goals Review   Personal Goals Review Weight Management/Obesity;Improve shortness of breath with  ADL's;Develop more efficient breathing techniques such as purse lipped breathing and diaphragmatic breathing and practicing self-pacing with activity.;Stress Weight Management/Obesity;Improve shortness of breath with ADL's;Develop more efficient breathing techniques such as purse lipped breathing and diaphragmatic breathing and practicing self-pacing with activity.;Stress Weight Management/Obesity;Improve shortness of breath with ADL's;Develop more efficient breathing techniques such as purse lipped breathing and diaphragmatic breathing and practicing self-pacing with activity.;Stress Weight Management/Obesity;Improve shortness of breath with ADL's Weight Management/Obesity;Improve shortness of breath with ADL's   Review Monthly review of patient's Core Components/Risk Factors/Patient Goals are as follows: Larsen has attended one session so far. Goal progressing for losing weight. Goal progressing for improving shortness of breath with ADL's. Kjersten is currently exercising on RA to maintain sats >88%. Goal  progressing for developing more efficient breathing techniques such as purse lipped breathing and diaphragmatic breathing; and practicing self-pacing with activity. Goal progressing to reduce stress. We will continue to monitor Tranice's progress throughout the program. Monthly review of patient's Core Components/Risk Factors/Patient Goals are as follows: Goal progressing for losing weight. Lashan has met with the staff dietitian on ways to achieve her weight loss goals. Goal progressing for improving shortness of breath with ADL's. Tashi is currently exercising on RA to maintain sats >88%. Goal progressing for developing more efficient breathing techniques such as purse lipped breathing and diaphragmatic breathing; and practicing self-pacing with activity. Goal progressing to reduce stress. We will continue to monitor Lanise's progress throughout the program. Monthly review of patient's Core Components/Risk Factors/Patient Goals are as follows: Goal progressing for losing weight. Laketha has met with the staff dietitian and planned for dietary changes. She has yet to implement these changes. Her goal is to incorporate more fruits and veggies and use the plate method. She has maintained her weight since starting the program. Goal progressing for improving shortness of breath with ADL's. Gailya is currently exercising on RA to maintain sats >88%. She has been able to increase her workload and METs on the NuStep and lap count on the track. Goal met for developing more efficient breathing techniques such as purse lipped breathing and diaphragmatic breathing; and practicing self-pacing with activity. Dickey can perform purse lipped breathing while short of breath. She demonstrated this while performing the warmup and while exercising. She can initiate PLB on her own. She works on diaphragmatic breathing at home. Goal met on reducing stress. Anber has applied stress reducing tips taught in class and is now seeing a  therapist about her decline in health and the loss of her significant other. She declines any additional needs at this time. We will continue to monitor Shayne's progress throughout the program. Monthly review of patient's Core Components/Risk Factors/Patient Goals are as follows: Goal progressing for losing weight. Olivette has met with the staff dietitian and planned for dietary changes. She has yet to implement these changes. Her goal is to incorporate more fruits and veggies and use the plate method. She has maintained her weight since starting the program. Goal progressing for improving shortness of breath with ADL's. Avree is currently exercising on RA to maintain sats >88%. She has been able to increase her workload and METs on the NuStep and lap count on the track.  We will continue to monitor Aviendha's progress throughout the program. Discharge review of patient's Core Components/Risk Factors/Patient Goals are as follows: Lakisa graduated the program on 11/10/24 completing 19 sessions. Goal met for weight loss. Gae worked with the lawyer and planned for dietary changes. She lost 3.7# while in the program.  She incorporated more fruits and veggies and used the plate method. Goal met for improving shortness of breath with ADL's. Apryl exercised on RA to maintain sats >88%. She was able to increase her workload and METs on the NuStep and lap count on the track. Her shortness of breath score decreased from 88 to 67 and her CAT score decreased from 32 to 23.   Expected Outcomes To improve shortness of breath with ADL's, develop more efficient breathing techniques such as purse lipped breathing and diaphragmatic breathing; and practicing self-pacing with activity, lose weight and reduce stress To improve shortness of breath with ADL's, develop more efficient breathing techniques such as purse lipped breathing and diaphragmatic breathing; and practicing self-pacing with activity, lose weight and  reduce stress Pt will show progress toward meeting expected goals and outcomes. Pt will show progress toward meeting expected goals and outcomes. Pt will continue to progress toward meeting goals and outcomes post Pulm Rehab.      Exercise Goals and Review:  Exercise Goals     Row Name 07/10/24 1029             Exercise Goals   Increase Physical Activity Yes       Intervention Provide advice, education, support and counseling about physical activity/exercise needs.;Develop an individualized exercise prescription for aerobic and resistive training based on initial evaluation findings, risk stratification, comorbidities and participant's personal goals.       Expected Outcomes Short Term: Attend rehab on a regular basis to increase amount of physical activity.;Long Term: Exercising regularly at least 3-5 days a week.;Long Term: Add in home exercise to make exercise part of routine and to increase amount of physical activity.       Increase Strength and Stamina Yes       Intervention Provide advice, education, support and counseling about physical activity/exercise needs.;Develop an individualized exercise prescription for aerobic and resistive training based on initial evaluation findings, risk stratification, comorbidities and participant's personal goals.       Expected Outcomes Short Term: Increase workloads from initial exercise prescription for resistance, speed, and METs.;Short Term: Perform resistance training exercises routinely during rehab and add in resistance training at home;Long Term: Improve cardiorespiratory fitness, muscular endurance and strength as measured by increased METs and functional capacity ( )       Able to understand and use rate of perceived exertion (RPE) scale Yes       Intervention Provide education and explanation on how to use RPE scale       Expected Outcomes Short Term: Able to use RPE daily in rehab to express subjective intensity level;Long Term:  Able to  use RPE to guide intensity level when exercising independently       Able to understand and use Dyspnea scale Yes       Intervention Provide education and explanation on how to use Dyspnea scale       Expected Outcomes Short Term: Able to use Dyspnea scale daily in rehab to express subjective sense of shortness of breath during exertion;Long Term: Able to use Dyspnea scale to guide intensity level when exercising independently       Knowledge and understanding of Target Heart Rate Range (THRR) Yes       Intervention Provide education and explanation of THRR including how the numbers were predicted and where they are located for reference       Expected Outcomes Short Term: Able to state/look up THRR;Short Term: Able to use daily as guideline for  intensity in rehab;Long Term: Able to use THRR to govern intensity when exercising independently       Understanding of Exercise Prescription Yes       Intervention Provide education, explanation, and written materials on patient's individual exercise prescription       Expected Outcomes Short Term: Able to explain program exercise prescription;Long Term: Able to explain home exercise prescription to exercise independently          Exercise Goals Re-Evaluation:  Exercise Goals Re-Evaluation     Row Name 07/16/24 1551 08/12/24 0910 09/07/24 0933 10/08/24 1639       Exercise Goal Re-Evaluation   Exercise Goals Review Increase Physical Activity;Able to understand and use Dyspnea scale;Understanding of Exercise Prescription;Increase Strength and Stamina;Knowledge and understanding of Target Heart Rate Range (THRR);Able to understand and use rate of perceived exertion (RPE) scale Increase Physical Activity;Able to understand and use Dyspnea scale;Understanding of Exercise Prescription;Increase Strength and Stamina;Knowledge and understanding of Target Heart Rate Range (THRR);Able to understand and use rate of perceived exertion (RPE) scale Increase Physical  Activity;Able to understand and use Dyspnea scale;Understanding of Exercise Prescription;Increase Strength and Stamina;Knowledge and understanding of Target Heart Rate Range (THRR);Able to understand and use rate of perceived exertion (RPE) scale Increase Physical Activity;Able to understand and use Dyspnea scale;Understanding of Exercise Prescription;Increase Strength and Stamina;Knowledge and understanding of Target Heart Rate Range (THRR);Able to understand and use rate of perceived exertion (RPE) scale    Comments Jocilynn has completed 1 exercise sessions. She exercises for 15 min on Nustep and track. She averages 1.7 METs at level 2 on the Nustep and 1.84 METs on the track. She performed the warmup and cooldown standing without limitaions. Mareta shows some deconditioning. Will continue to monitor and progress as able. Melessia has completed 6 exercise sessions. She exercises for 15 min on Nustep and track. She averages 1.9 METs at level 2 on the Nustep and 2.18 METs on the track. She performs the warmup and cooldown standing without limitations. Delisa has increased her track laps. She seems very motivated to exercise. Will continue to monitor and progress as able. Sharhonda has completed 10 exercise sessions. She exercises for 15 min on Nustep and track. She averages 2 METs at level 3 on the Nustep and 1.56 METs on the track. She performs the warmup and cooldown standing without limitations. Najma has increased her level on the Nustep as METs have slightly increased. Her track laps have declined due to increased shortness of breath. Ilo has missed several sessions because of her shortness of breath. Will continue to monitor and progress as able. Aliyanah has completed 14 exercise sessions. She exercises for 15 min on Nustep and track. She averages 2.1 METs at level 4 on the Nustep and 2.0 METs at 1.2 mph and 1% incline on the treadmill. She performs the warmup and cooldown standing without limitations.  Mariyana has increased her level on the Nustep. She has also progressed from track walking to treadmill walking. She tolerates the treadmill well. Canary has missed several sessions due to a recent hospitalization. Will continue to monitor and progress as able.    Expected Outcomes Through exercise at rehab and home, the patient will decrease shortness of breath with daily activities and feel confident in carrying out an exercise regimen at home. Through exercise at rehab and home, the patient will decrease shortness of breath with daily activities and feel confident in carrying out an exercise regimen at home. Through exercise at rehab and home, the patient  will decrease shortness of breath with daily activities and feel confident in carrying out an exercise regimen at home. Through exercise at rehab and home, the patient will decrease shortness of breath with daily activities and feel confident in carrying out an exercise regimen at home.       Nutrition & Weight - Outcomes:  Pre Biometrics - 07/10/24 1020       Pre Biometrics   Grip Strength 18 kg   new dynomometer          Nutrition:  Nutrition Therapy & Goals - 07/17/24 1557       Nutrition Therapy   Diet General Healthy Diet    Drug/Food Interactions Statins/Certain Fruits      Personal Nutrition Goals   Nutrition Goal Patient to improve diet quality by using the plate method as a guide for meal planning to include lean protein/plant protein, fruits, vegetables, whole grains, nonfat dairy as part of a well-balanced diet.    Comments Patient has medical history of HTN, peripheral vascular disease, hyperlipidemia, former smoker, prediabetes, cirrhosis, COPD3. LDL is well controlled. A1c remains in a prediabetic range. Liver enzymes are WNL.  She reports following a reguarly diet and reports that she has been working on diet and exercise to aid with weight loss. Patient will benefit from participation in pulmonary rehab for nutrition  education, exercise, and lifestyle modification.      Intervention Plan   Intervention Prescribe, educate and counsel regarding individualized specific dietary modifications aiming towards targeted core components such as weight, hypertension, lipid management, diabetes, heart failure and other comorbidities.;Nutrition handout(s) given to patient.    Expected Outcomes Short Term Goal: Understand basic principles of dietary content, such as calories, fat, sodium, cholesterol and nutrients.;Long Term Goal: Adherence to prescribed nutrition plan.          Nutrition Discharge:   Education Questionnaire Score:  Knowledge Questionnaire Score - 10/22/24 1222       Knowledge Questionnaire Score   Pre Score 11/18    Post Score 14/18          Goals reviewed with patient; copy given to patient.

## 2024-11-16 NOTE — Patient Instructions (Signed)
POST CAPSULE INSTRUCTIONS:   Contact our office immediately at [phone] if you suffer from any abdominal pain, nausea, or vomiting during capsule endoscopy. Do not eat or drink for at least 2 hours. After 2 hours you may have any of the following to drink: Water                           White grape juice 7-Up                            Chicken Bouillon Sprite                           Ginger Ale  After 4 hours you may have a light snack to include any of the following: A cup of soup               sandwich Bowl of cereal             Rice Toast                           Eggs 2-3 small cookies (i.e. vanilla wafers or graham crackers)  Return to this office at 4:00 pm. After this you will be able to return to your normal diet.  During your procedure do not go near anyone else that is having capsule endoscopy.  Do not be in close contact with an MRI machine or a radio or television tower.   Do not wear a heavy coat or sweater because your recorder may over heat and stop recording.   Do not disconnect the equipment or remove the belt at any time.  Since the Data Recorder is actually a small computer, it should be treated with utmost care and protection.  Avoid sudden movement and banging of the Data Recorder.    Do not do any heavy lifting or strenuous physical activity during the test especially if it involves sweating.  Do not bend over or stoop during capsule endoscopy.  During capsule endoscopy, you will need to verify every 15 minutes that the small light on top of the Data Recorder is blinking twice per second.  If for some reason it stops blinking, record the time and contact our office at [phone].                     

## 2024-11-18 ENCOUNTER — Ambulatory Visit: Admitting: Clinical

## 2024-11-18 ENCOUNTER — Ambulatory Visit: Payer: Self-pay

## 2024-11-18 NOTE — Telephone Encounter (Signed)
 Unable to reach patient for triage x 3 attempts.

## 2024-11-18 NOTE — Telephone Encounter (Signed)
 Note also needs TOC   Copied from CRM #[id number]. Topic: Clinical - Red Word Triage >> Nov 18, 2024  9:30 AM [name] wrote: [facility] that prompted transfer to Nurse Triage: Patient called in wanting to be referred to ENT , stated she is having a lot of pain with her ears she can't even sleep

## 2024-11-20 ENCOUNTER — Ambulatory Visit: Payer: Medicare HMO

## 2024-11-20 VITALS — Ht 63.0 in | Wt 186.0 lb

## 2024-11-20 DIAGNOSIS — Z0000 Encounter for general adult medical examination without abnormal findings: Secondary | ICD-10-CM | POA: Diagnosis not present

## 2024-11-20 NOTE — Patient Instructions (Addendum)
 [name],  Thank you for taking the time for your Medicare Wellness Visit. I appreciate your continued commitment to your health goals. Please review the care plan we discussed, and feel free to reach out if I can assist you further.  Please note that Annual Wellness Visits do not include a physical exam. Some assessments may be limited, especially if the visit was conducted virtually. If needed, we may recommend an in-person follow-up with your provider.  Ongoing Care Seeing your primary care provider every 3 to 6 months helps us  monitor your health and provide consistent, personalized care.   Referrals If a referral was made during today's visit and you haven't received any updates within two weeks, please contact the referred provider directly to check on the status.  Recommended Screenings:  Health Maintenance  Topic Date Due   Yearly kidney health urinalysis for diabetes  Never done   Hepatitis B Vaccine (1 of 3 - Risk 3-dose series) Never done   DTaP/Tdap/Td vaccine (2 - Tdap) 04/22/2024   Hemoglobin A1C  06/23/2024   Screening for Lung Cancer  03/11/2025   Complete foot exam   09/30/2025   Yearly kidney function blood test for diabetes  10/16/2025   Eye exam for diabetics  11/19/2025   Medicare Annual Wellness Visit  11/20/2025   Colon Cancer Screening  06/09/2026   Breast Cancer Screening  11/09/2026   Pneumococcal Vaccine for age over 69  Completed   Flu Shot  Completed   Hepatitis C Screening  Completed   HIV Screening  Completed   Zoster (Shingles) Vaccine  Completed   HPV Vaccine  Aged Out   Meningitis B Vaccine  Aged Out   COVID-19 Vaccine  Discontinued       11/20/2024    8:09 AM  Advanced Directives  Does Patient Have a Medical Advance Directive? No  Would patient like information on creating a medical advance directive? Yes (Inpatient - patient requests chaplain consult to create a medical advance directive);No - Patient declined    Vision: Annual  vision screenings are recommended for early detection of glaucoma, cataracts, and diabetic retinopathy. These exams can also reveal signs of chronic conditions such as diabetes and high blood pressure.  Dental: Annual dental screenings help detect early signs of oral cancer, gum disease, and other conditions linked to overall health, including heart disease and diabetes.
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
## 2024-11-23 ENCOUNTER — Telehealth: Payer: Self-pay | Admitting: *Deleted

## 2024-11-23 ENCOUNTER — Other Ambulatory Visit

## 2024-11-23 NOTE — Telephone Encounter (Signed)
 Not a pt at this office, [facility]. I attempted to call pt,no answer,

## 2024-11-23 NOTE — Telephone Encounter (Signed)
 Not a pt at our office, [facility] -sending to Camie Furbish.

## 2024-11-23 NOTE — Telephone Encounter (Signed)
 Copied from CRM [id number]. Topic: Clinical - Medication Question >> Nov 20, 2024 12:43 PM Diannia H wrote: Reason for CRM: Kim from [facility] called and was trying to schedule delivery for the patient medicine benralizumab  (FASENRA ) prefilled syringe 30 mg. Per the notes and encounters was not sure when she was to return. Could you call Luke back at [phone] and provide the information on the patients return appointment and set up the delivery day and time.

## 2024-11-25 ENCOUNTER — Ambulatory Visit

## 2024-11-26 ENCOUNTER — Encounter: Payer: Self-pay | Admitting: Gastroenterology

## 2024-11-26 ENCOUNTER — Other Ambulatory Visit (INDEPENDENT_AMBULATORY_CARE_PROVIDER_SITE_OTHER)

## 2024-11-26 ENCOUNTER — Ambulatory Visit: Admitting: Gastroenterology

## 2024-11-26 ENCOUNTER — Ambulatory Visit: Payer: Self-pay | Admitting: Gastroenterology

## 2024-11-26 ENCOUNTER — Ambulatory Visit

## 2024-11-26 VITALS — BP 112/70 | HR 89 | Ht 63.0 in | Wt 186.4 lb

## 2024-11-26 DIAGNOSIS — K5900 Constipation, unspecified: Secondary | ICD-10-CM | POA: Diagnosis not present

## 2024-11-26 DIAGNOSIS — D500 Iron deficiency anemia secondary to blood loss (chronic): Secondary | ICD-10-CM

## 2024-11-26 LAB — CBC WITH DIFFERENTIAL/PLATELET
Basophils Absolute: 0.1 K/uL (ref 0.0–0.1)
Basophils Relative: 0.6 % (ref 0.0–3.0)
Eosinophils Absolute: 0 K/uL (ref 0.0–0.7)
Eosinophils Relative: 0 % (ref 0.0–5.0)
HCT: 26.7 % — ABNORMAL LOW (ref 36.0–46.0)
Hemoglobin: 8.3 g/dL — ABNORMAL LOW (ref 12.0–15.0)
Lymphocytes Relative: 21.2 % (ref 12.0–46.0)
Lymphs Abs: 2.2 K/uL (ref 0.7–4.0)
MCHC: 31.2 g/dL (ref 30.0–36.0)
MCV: 81.5 fl (ref 78.0–100.0)
Monocytes Absolute: 0.8 K/uL (ref 0.1–1.0)
Monocytes Relative: 7.6 % (ref 3.0–12.0)
Neutro Abs: 7.3 K/uL (ref 1.4–7.7)
Neutrophils Relative %: 70.6 % (ref 43.0–77.0)
Platelets: 436 K/uL — ABNORMAL HIGH (ref 150.0–400.0)
RBC: 3.27 Mil/uL — ABNORMAL LOW (ref 3.87–5.11)
RDW: 23.4 % — ABNORMAL HIGH (ref 11.5–15.5)
WBC: 10.3 K/uL (ref 4.0–10.5)

## 2024-11-26 LAB — IBC + FERRITIN
Ferritin: 10.9 ng/mL (ref 10.0–291.0)
Iron: 22 ug/dL — ABNORMAL LOW (ref 42–145)
Saturation Ratios: 5.5 % — ABNORMAL LOW (ref 20.0–50.0)
TIBC: 399 ug/dL (ref 250.0–450.0)
Transferrin: 285 mg/dL (ref 212.0–360.0)

## 2024-11-26 NOTE — Patient Instructions (Addendum)
 Your provider has requested that you go to the basement level for lab work before leaving today. Press B on the elevator. The lab is located at the first door on the left as you exit the elevator.   You have been scheduled for an endoscopy. Please follow written instructions given to you at your visit today.  If you use inhalers (even only as needed), please bring them with you on the day of your procedure.  If you take any of the following medications, they will need to be adjusted prior to your procedure:   DO NOT TAKE 7 DAYS PRIOR TO TEST- Trulicity (dulaglutide) Ozempic, Wegovy (semaglutide) Mounjaro, Zepbound (tirzepatide) Bydureon Bcise (exanatide extended release)  DO NOT TAKE 1 DAY PRIOR TO YOUR TEST Rybelsus (semaglutide) Adlyxin (lixisenatide) Victoza (liraglutide) Byetta (exanatide) ___________________________________________________________________________   Thank you for trusting me with your gastrointestinal care!    Dr. [name] DOUGLAS [facility]

## 2024-11-26 NOTE — Progress Notes (Addendum)
 [facility] Progress Note  Chief Complaint: Iron  deficiency anemia of chronic blood loss  Subjective  Prior history   Patient admitted 09/30/2024 to 10/01/2024.  History of iron  deficiency anemia and had not been taking her iron  supplements, followed up with PCP and on routine labs found to have hemoglobin around 6 and advised to go to the ED.  Had some exertional weakness over the previous few weeks which had worsened in the previous few days.  Did tell GI she had frequent use of NSAIDs. History of EGD in 2022 which showed gastric ulcers.  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  Colonoscopy in 2022 showed diverticulosis and internal hemorrhoids. In the ED hemoglobin found to be 6.7.  Stool for occult blood was positive.  She received 2 units PRBCs, GI was consulted.  EGD 10/30/24 was essentially normal. GI cleared for discharge home with plan to arrange VCE and outpatient iron  transfusions.    Video capsule study done on 11/16/2024  History of Present Illness  Drema was here for follow-up of her anemia and to discuss results of her capsule study.  She is taking iron  capsule once a day and notes dark stool but it is not black and tarry nor bright red blood or maroon. Iron  has been causing some constipation, and sometimes no BM for 5 to 7 days.  She has taken MiraLAX  occasionally but not consistently.  She believes her COPD is stable, she takes an inhaler periodically and uses a nebulizer twice a day.  No supplemental oxygen   ROS: Cardiovascular:  no chest pain Respiratory: Intermittent dyspnea from COPD Fatigue, which she attributes to the anemia Arthralgias Anxiety Remainder of systems negative except as above The patient's Past Medical, Family and Social History were reviewed and are on file in the EMR. Past Medical History:  Diagnosis Date   Allergy     Anemia, iron  deficiency 12/22/2014   pt. denies   Anxiety    Arthritis    ASTHMA 05/12/2009   Severe AFL (Spirometry  05/2009: pre-BD FEV1 0.87L 34% pred, post-BD FEV1 1.11L 44% pred) Volumes hyperinflated Decreased DLCO that does not fully correct to normal range for alveolar volume.      Asthma    Bipolar disorder (HCC)    with anxiety, depression   COPD (chronic obstructive pulmonary disease) (HCC)    Depression    Eczema 05/18/2022   Emphysema of lung (HCC)    Fibromyalgia 05/14/2014   GERD (gastroesophageal reflux disease)    History of kidney stones    Hyperlipidemia 04/20/2017   HYPERTENSION 05/12/2009   Qualifier: Diagnosis of  By: Primus LATHER LEODIS), [name]     Osteoporosis    Peripheral vascular disease    Pneumonia    Prediabetes 02/23/2014   pt. denies   Seizure (HCC)    Sleep apnea    Stroke (HCC) 11/2020   Urticaria    Vitamin D  deficiency     Past Surgical History:  Procedure Laterality Date   ABDOMINAL HYSTERECTOMY     ANTERIOR CERVICAL DECOMP/DISCECTOMY FUSION N/A 07/28/2020   Procedure: ANTERIOR CERVICAL DECOMPRESSION/DISCECTOMY FUSION. INTERBODY PROTHESIS, PLATE/SCREWS CERVICAL THREE- CERVICAL FOUR, CERVICAL FOUR- CERVICAL FIVE;  Surgeon: Mavis Purchase, MD;  Location: [facility] OR;  Service: Neurosurgery;  Laterality: N/A;   BACK SURGERY     COLONOSCOPY  12/20/2011   Procedure: COLONOSCOPY;  Surgeon: Elsie CHRISTELLA Cree, MD;  Location: [facility] ENDOSCOPY;  Service: Endoscopy;  Laterality: N/A;   COLONOSCOPY  03/05/2012   Procedure: COLONOSCOPY;  Surgeon: Elsie  CHRISTELLA Cree, MD;  Location: [facility];  Service: Endoscopy;  Laterality: N/A;   DIAGNOSTIC LAPAROSCOPY     ESOPHAGOGASTRODUODENOSCOPY N/A 10/01/2024   Procedure: EGD (ESOPHAGOGASTRODUODENOSCOPY);  Surgeon: Nandigam, Kavitha V, MD;  Location: [facility] ENDOSCOPY;  Service: Gastroenterology;  Laterality: N/A;   ESOPHAGOGASTRODUODENOSCOPY (EGD) WITH PROPOFOL  N/A 10/31/2023   Procedure: ESOPHAGOGASTRODUODENOSCOPY (EGD) WITH PROPOFOL ;  Surgeon: Legrand Victory LITTIE DOUGLAS, MD;  Location: [facility] ENDOSCOPY;  Service: Gastroenterology;  Laterality: N/A;    HEMORRHOID SURGERY     INCISE AND DRAIN ABCESS     KIDNEY STONE SURGERY     NECK SURGERY     x 2 Dr JINNY Budge   SPINE SURGERY  2019   TOE SURGERY     TUBAL LIGATION       Objective:  Med list reviewed Current Medications[1]   Vital signs in last 24 hrs: Vitals:   11/26/24 1427  BP: 112/70  Pulse: 89   Wt Readings from Last 3 Encounters:  11/26/24 186 lb 6 oz (84.5 kg)  11/20/24 186 lb (84.4 kg)  11/10/24 186 lb 8.2 oz (84.6 kg)    Physical Exam  Pleasant and conversational, breathing comfortably on room air, ambulatory, gets on exam table easily HEENT: sclera anicteric, oral mucosa moist without lesions Neck: supple, no thyromegaly, JVD or lymphadenopathy Cardiac: Regular without appreciable murmur,  no peripheral edema Pulm: Globally decreased breath sounds, no wheezing, normal RR and effort noted Abdomen: soft, no focal tenderness, with active bowel sounds. No guarding or palpable hepatosplenomegaly. Skin; warm and dry, no jaundice or rash   Labs:     Latest Ref Rng & Units 10/16/2024    6:35 PM 10/09/2024    2:44 PM 10/05/2024   12:08 PM  CBC  WBC 4.0 - 10.5 K/uL 20.3  20.4 Repeated and verified X2.  16.9   Hemoglobin 12.0 - 15.0 g/dL 89.3  9.8  9.3   Hematocrit 36.0 - 46.0 % 35.8  32.1  30.1   Platelets 150 - 400 K/uL 499  590.0  556.0    Iron /TIBC/Ferritin/ %Sat    Component Value Date/Time   IRON  17 (L) 09/30/2024 1342   TIBC 401.8 09/30/2024 1342   FERRITIN 7.7 (L) 09/30/2024 1342   IRONPCTSAT 4.2 (L) 09/30/2024 1342   IRONPCTSAT 21 07/25/2021 0758    ___________________________________________ Radiologic studies:   ____________________________________________ Other:  Video capsule study was reviewed earlier today and had a relatively rapid small bowel transit time about 1 hour 45 minutes. In what is probably the proximal small bowel (about 7 minutes after first duodenal image), there is some fresh blood, and any underlying lesion cannot be  seen due to that retained blood.  No other apparent areas of blood or mucosal lesions seen on the remainder of the study. _____________________________________________   Encounter Diagnoses  Name Primary?   Iron  deficiency anemia due to chronic blood loss Yes   Acute constipation      Assessment & Plan  She has had chronic small bowel bleeding as evidenced by this video capsule study.  It is most often intermittent and from either small bowel ulcer or AVM commonly.  Misk tells me she used to take [facility] powders quite frequently for headaches until she was told to stop that.  However she still takes ibuprofen  and nearly every day takes Excedrin (though does not know whether or not it contains aspirin ).  Her blood counts and iron  levels were last checked about 6 weeks ago and need to be updated.  If they are not coming up as expected, IV iron  is recommended.  She needs a small bowel enteroscopy in the hospital outpatient endoscopy lab as soon as can be arranged.  Next available date was at the end of this month (during my inpatient consult week).  Procedure described in detail along with rationale, risks and benefits and she was agreeable.  The benefits and risks of the planned procedure(s) were described in detail with the patient or (when appropriate) their health care proxy.  Risks were outlined as including, but not limited to, bleeding, infection, perforation, adverse medication reaction leading to cardiac or pulmonary decompensation, pancreatitis (if ERCP).  The limitation of incomplete mucosal visualization was also discussed.  No guarantees or warranties were given.  She is at increased risk of complications from endoscopic procedures due to her underlying COPD and anemia.  Recommend she take MiraLAX , if capful once daily for 3 to 4 days to help relieve constipation, then decrease to about a half a capful a day or adjust dose as needed to have a BM at least every other day.  Hopefully when  this anemia resolves she will no longer need oral iron  that is exacerbating the constipation.  Lastly, I strongly recommended she catch up with primary care to consider alternate treatment for her headaches that hopefully would not include anything containing aspirin  or other NSAIDs because that seems likely exacerbating this ongoing GI bleeding.  Perhaps a referral to neurology may be in order if not previously done.   35 minutes were spent on this encounter (including chart review, history/exam, counseling/coordination of care, and documentation) > 50% of that time was spent on counseling and coordination of care.   Victory LITTIE Brand III     [1]  Current Outpatient Medications:    AIRSUPRA  90-80 MCG/ACT AERO, SMARTSIG:2 Puff(s) Via Inhaler 4 Times Daily PRN, Disp: , Rfl:    albuterol  (PROVENTIL ) (2.5 MG/3ML) 0.083% nebulizer solution, USE 1 VIAL VIA NEBULIZER EVERY 4 HOURS AS NEEDED FOR WHEEZING OR SHORTNESS OF BREATH, Disp: 75 mL, Rfl: 3   albuterol  (VENTOLIN  HFA) 108 (90 Base) MCG/ACT inhaler, INHALE 2 PUFFS INTO THE LUNGS EVERY 6 HOURS AS NEEDED FOR WHEEZING OR SHORTNESS OF BREATH, Disp: 18 g, Rfl: 3   amLODipine  (NORVASC ) 5 MG tablet, Take 1 tablet (5 mg total) by mouth daily., Disp: 90 tablet, Rfl: 3   atorvastatin  (LIPITOR) 80 MG tablet, Take 1 tablet (80 mg total) by mouth daily., Disp: 90 tablet, Rfl: 1   azelastine  (OPTIVAR ) 0.05 % ophthalmic solution, Place 1 drop into both eyes 2 (two) times daily., Disp: 6 mL, Rfl: 12   BREZTRI  AEROSPHERE 160-9-4.8 MCG/ACT AERO inhaler, INHALE 2 PUFFS INTO THE LUNGS IN THE MORNING AND AT BEDTIME, Disp: 10.7 g, Rfl: 11   budesonide  (PULMICORT ) 0.5 MG/2ML nebulizer solution, USE 2 ML(0.5 MG) VIA NEBULIZER IN THE MORNING AND AT BEDTIME, Disp: 120 mL, Rfl: 2   carbamazepine  (TEGRETOL ) 200 MG tablet, Take 2-3 tablets (400-600 mg total) by mouth See admin instructions. Take 400mg  in the morning and 600mg  at bedtime., Disp: 150 tablet, Rfl: 2   cetirizine   (ZYRTEC ) 10 MG tablet, Take 10 mg by mouth daily., Disp: , Rfl:    cromolyn  (OPTICROM ) 4 % ophthalmic solution, Place 1 drop into both eyes 4 (four) times daily., Disp: 10 mL, Rfl: 12   doxepin  (SINEQUAN ) 25 MG capsule, TAKE 1 CAPSULE(25 MG) BY MOUTH AT BEDTIME AS NEEDED, Disp: 30 capsule, Rfl: 5   Ensifentrine  (OHTUVAYRE ) 3  MG/2.5ML SUSP, Inhale 3 mg into the lungs in the morning and at bedtime., Disp: 150 mL, Rfl: 11   estradiol  (ESTRACE ) 0.01 % CREA vaginal cream, PLACE 0.5 GRAMS VAGINALLY NIGHTLY FOR 2 WEEKS, THEN TWICE A WEEK AFTER, Disp: 42.5 g, Rfl: 2   famotidine  (PEPCID ) 40 MG tablet, Take 1 tablet (40 mg total) by mouth 2 (two) times daily., Disp: 60 tablet, Rfl: 5   ferrous sulfate  325 (65 FE) MG EC tablet, Take 1 tablet (325 mg total) by mouth daily with breakfast., Disp: 30 tablet, Rfl: 1   fluticasone  (FLONASE ) 50 MCG/ACT nasal spray, SHAKE LIQUID AND USE 1 SPRAY IN EACH NOSTRIL TWICE DAILY AS NEEDED FOR ALLERGIES OR RHINITIS, Disp: 16 g, Rfl: 5   formoterol  (PERFOROMIST ) 20 MCG/2ML nebulizer solution, Take 20 mcg by nebulization 2 (two) times daily., Disp: , Rfl:    furosemide  (LASIX ) 20 MG tablet, TAKE 1 TABLET BY MOUTH DAILY AS NEEDED, Disp: 30 tablet, Rfl: 5   hydrOXYzine  (VISTARIL ) 25 MG capsule, Take 1 capsule (25 mg total) by mouth at bedtime as needed for itching., Disp: , Rfl:    ipratropium (ATROVENT  HFA) 17 MCG/ACT inhaler, Inhale 2 puffs into the lungs every 4 (four) hours as needed for wheezing., Disp: 1 each, Rfl: 5   irbesartan  (AVAPRO ) 150 MG tablet, Take 1 tablet (150 mg total) by mouth daily., Disp: 90 tablet, Rfl: 3   levocetirizine (XYZAL ) 5 MG tablet, Take 1 tablet (5 mg total) by mouth every evening., Disp: , Rfl:    lidocaine  (XYLOCAINE ) 5 % ointment, Apply 1 Application topically as needed., Disp: 35.44 g, Rfl: 0   meclizine  (ANTIVERT ) 12.5 MG tablet, Take 12.5 mg by mouth 3 (three) times daily as needed for dizziness., Disp: , Rfl:    methocarbamol  (ROBAXIN ) 500  MG tablet, Take 1 tablet (500 mg total) by mouth 2 (two) times daily., Disp: 20 tablet, Rfl: 0   montelukast  (SINGULAIR ) 10 MG tablet, TAKE 1 TABLET(10 MG) BY MOUTH AT BEDTIME, Disp: 90 tablet, Rfl: 1   naproxen  (NAPROSYN ) 500 MG tablet, Take 1 tablet (500 mg total) by mouth 2 (two) times daily., Disp: 30 tablet, Rfl: 0   omeprazole  (PRILOSEC) 40 MG capsule, Take 1 capsule (40 mg total) by mouth daily., Disp: 90 capsule, Rfl: 3   ondansetron  (ZOFRAN -ODT) 4 MG disintegrating tablet, DISSOLVE 1 TABLET(4 MG) ON THE TONGUE EVERY 8 HOURS AS NEEDED FOR NAUSEA OR VOMITING, Disp: 30 tablet, Rfl: 1   pantoprazole  (PROTONIX ) 40 MG tablet, Take 40 mg by mouth daily., Disp: , Rfl:    predniSONE  (DELTASONE ) 10 MG tablet, 3 tabs by mouth per day for 3 days,2tabs per day for 3 days,1tab per day for 3 days, Disp: 18 tablet, Rfl: 0   Suvorexant  (BELSOMRA ) 15 MG TABS, Take by mouth., Disp: , Rfl:    tacrolimus  (PROTOPIC ) 0.03 % ointment, Apply 1 Application topically 2 (two) times daily as needed (rash)., Disp: 60 g, Rfl: 5   triamcinolone  ointment (KENALOG ) 0.1 %, Apply 1 Application topically at bedtime., Disp: 30 g, Rfl: 5   Vibegron  (GEMTESA ) 75 MG TABS, Take 1 tablet (75 mg total) by mouth daily., Disp: 30 tablet, Rfl: 11  Current Facility-Administered Medications:    benralizumab  (FASENRA ) prefilled syringe 30 mg, 30 mg, Subcutaneous, Q8 Weeks, Gallagher, Joel Louis, MD, 30 mg at 10/28/24 1420

## 2024-11-27 ENCOUNTER — Telehealth: Payer: Self-pay | Admitting: Pharmacy Technician

## 2024-11-27 NOTE — Telephone Encounter (Signed)
 Inbound call from patient requesting to speak to [facility]. Patient is requesting a call back. Please advise.

## 2024-11-27 NOTE — Telephone Encounter (Addendum)
 Auth Submission: approved Site of care: Site of care: [facility] Payer: [employer] MEDICARE Medication & CPT/J Code(s) submitted: Feraheme (ferumoxytol) [id number] Diagnosis Code: D50.9 Route of submission (phone, fax, portal):  Phone # Fax # Auth type: Buy/Bill PB Units/visits requested: X2 Reference number: [id number] Approval from: 11/27/24 to  12/16/25

## 2024-11-30 ENCOUNTER — Encounter (INDEPENDENT_AMBULATORY_CARE_PROVIDER_SITE_OTHER): Payer: Self-pay | Admitting: Physician Assistant

## 2024-11-30 ENCOUNTER — Ambulatory Visit (INDEPENDENT_AMBULATORY_CARE_PROVIDER_SITE_OTHER): Admitting: Physician Assistant

## 2024-11-30 VITALS — BP 127/81 | HR 88 | Temp 98.6°F

## 2024-11-30 DIAGNOSIS — M542 Cervicalgia: Secondary | ICD-10-CM

## 2024-11-30 DIAGNOSIS — H9203 Otalgia, bilateral: Secondary | ICD-10-CM

## 2024-12-01 ENCOUNTER — Ambulatory Visit

## 2024-12-01 ENCOUNTER — Encounter: Payer: Self-pay | Admitting: Gastroenterology

## 2024-12-01 ENCOUNTER — Other Ambulatory Visit (HOSPITAL_COMMUNITY): Payer: Self-pay | Admitting: Gastroenterology

## 2024-12-01 ENCOUNTER — Ambulatory Visit (INDEPENDENT_AMBULATORY_CARE_PROVIDER_SITE_OTHER): Admitting: Audiology

## 2024-12-01 ENCOUNTER — Telehealth (HOSPITAL_COMMUNITY): Payer: Self-pay

## 2024-12-01 DIAGNOSIS — H93292 Other abnormal auditory perceptions, left ear: Secondary | ICD-10-CM

## 2024-12-01 DIAGNOSIS — Z0110 Encounter for examination of ears and hearing without abnormal findings: Secondary | ICD-10-CM

## 2024-12-01 DIAGNOSIS — J4550 Severe persistent asthma, uncomplicated: Secondary | ICD-10-CM | POA: Diagnosis not present

## 2024-12-01 DIAGNOSIS — D509 Iron deficiency anemia, unspecified: Secondary | ICD-10-CM | POA: Insufficient documentation

## 2024-12-01 NOTE — Progress Notes (Signed)
 Dr. Legrand, Patient has been approved for [facility] and our team will reach out shortly to get patient scheduled.  Thanks Luke

## 2024-12-01 NOTE — Telephone Encounter (Signed)
 Auth Submission: APPROVED Site of care: Site of care: [facility] Payer: [employer] Medication & CPT/J Code(s) submitted: Feraheme (ferumoxytol) [id number] Diagnosis Code: D50.9 Route of submission (phone, fax, portal): phone Phone #[phone] Fax # Auth type: Buy/Bill HB Units/visits requested: 510mg  x 2 doses Authorization number: [id number] Reference number: [id number] Representative: Meade Approval from: 11/27/24 to 12/16/25  Called to change facility NPI from [facility] to [facility]. NPI numbers for authorization has been updated and reflect services being rendering at [facility].

## 2024-12-01 NOTE — Progress Notes (Signed)
 Dear Dr. Arletta, Here is my assessment for our mutual patient, [name]. Thank you for allowing me the opportunity to care for your patient. Please do not hesitate to contact me should you have any other questions. Sincerely, Chyrl Cohen PA-C  Otolaryngology Clinic Note Referring provider: Dr. Arletta HPI:  [name] is a 63 y.o. female kindly referred by Dr. Arletta   Discussed the use of AI scribe software for clinical note transcription with the patient, who gave verbal consent to proceed.  History of Present Illness   [name] is a 63 year old female who presents with chronic ear pain.  She experiences severe pain in both ears, with the left side being more affected. The pain is described as a daily and nightly occurrence, sometimes feeling like a sudden impact. It is located behind the ear and has persisted for about a year. The pain can start upon waking or before going to bed and is present throughout the day.  No hearing problems, although others have commented on her speaking volume. She has a history of ear wax buildup, which was previously cleaned by a healthcare provider. No hearing test has been conducted yet.  She is concerned due to a family history of aneurysms and a sister with a brain tumor, which has heightened her anxiety about her symptoms. She describes a sensation of something 'crawling around' in her ear, which she finds distressing.  She uses a CPAP machine and is unsure if she grinds her teeth. She has undergone two neck surgeries with ACDF spanning C3-C7  The patient was previously seen on 06/25/2024 by Dr. Soldatova and diagnosed with TMJ disorder.          Independent Review of Additional Tests or Records:  none   PMH/Meds/All/SocHx/FamHx/ROS:   Past Medical History:  Diagnosis Date   Allergy     Anemia, iron  deficiency 12/22/2014   pt. denies   Anxiety    Arthritis    ASTHMA 05/12/2009   Severe AFL (Spirometry  05/2009: pre-BD FEV1 0.87L 34% pred, post-BD FEV1 1.11L 44% pred) Volumes hyperinflated Decreased DLCO that does not fully correct to normal range for alveolar volume.      Asthma    Bipolar disorder (HCC)    with anxiety, depression   COPD (chronic obstructive pulmonary disease) (HCC)    Depression    Eczema 05/18/2022   Emphysema of lung (HCC)    Fibromyalgia 05/14/2014   GERD (gastroesophageal reflux disease)    History of kidney stones    Hyperlipidemia 04/20/2017   HYPERTENSION 05/12/2009   Qualifier: Diagnosis of  By: Primus LATHER LEODIS), [name]     Osteoporosis    Peripheral vascular disease    Pneumonia    Prediabetes 02/23/2014   pt. denies   Seizure (HCC)    Sleep apnea    Stroke (HCC) 11/2020   Urticaria    Vitamin D  deficiency      Past Surgical History:  Procedure Laterality Date   ABDOMINAL HYSTERECTOMY     ANTERIOR CERVICAL DECOMP/DISCECTOMY FUSION N/A 07/28/2020   Procedure: ANTERIOR CERVICAL DECOMPRESSION/DISCECTOMY FUSION. INTERBODY PROTHESIS, PLATE/SCREWS CERVICAL THREE- CERVICAL FOUR, CERVICAL FOUR- CERVICAL FIVE;  Surgeon: Mavis Purchase, MD;  Location: [facility] OR;  Service: Neurosurgery;  Laterality: N/A;   BACK SURGERY     COLONOSCOPY  12/20/2011   Procedure: COLONOSCOPY;  Surgeon: Elsie CHRISTELLA Cree, MD;  Location: [facility] ENDOSCOPY;  Service: Endoscopy;  Laterality: N/A;   COLONOSCOPY  03/05/2012   Procedure: COLONOSCOPY;  Surgeon: Elsie  CHRISTELLA Cree, MD;  Location: [facility];  Service: Endoscopy;  Laterality: N/A;   DIAGNOSTIC LAPAROSCOPY     ESOPHAGOGASTRODUODENOSCOPY N/A 10/01/2024   Procedure: EGD (ESOPHAGOGASTRODUODENOSCOPY);  Surgeon: Nandigam, Kavitha V, MD;  Location[facility] ENDOSCOPY;  Service: Gastroenterology;  Laterality: N/A;   ESOPHAGOGASTRODUODENOSCOPY (EGD) WITH PROPOFOL  N/A 10/31/2023   Procedure: ESOPHAGOGASTRODUODENOSCOPY (EGD) WITH PROPOFOL ;  Surgeon: Legrand Victory LITTIE DOUGLAS, MD;  Location: [facility] ENDOSCOPY;  Service: Gastroenterology;  Laterality: N/A;    HEMORRHOID SURGERY     INCISE AND DRAIN ABCESS     KIDNEY STONE SURGERY     NECK SURGERY     x 2 Dr JINNY Budge   SPINE SURGERY  2019   TOE SURGERY     TUBAL LIGATION      Family History  Problem Relation Age of Onset   Diabetes Mother    COPD Mother    Heart disease Mother    Asthma Mother    Diabetes Father    Kidney disease Father    Anesthesia problems Father    Alcohol  abuse Father    Colon cancer Father 55   Diabetes Sister    Hypertension Sister    Heart disease Sister    Diabetes Sister    Brain cancer Sister    Hypertension Sister    Asthma Sister    Diabetes Sister    COPD Sister    Hypertension Sister    Breast cancer Sister 32   Anemia Sister    Hypertension Sister    Diabetes Sister    Anemia Sister    Hypertension Sister    Diabetes Brother    Sleep apnea Brother    Asthma Brother    Alcohol  abuse Brother    Heart disease Brother    COPD Brother    Depression Brother    Heart disease Brother    Asthma Brother    Hypertension Brother    Bladder Cancer Brother    Hypertension Brother    Hypertension Daughter    Anemia Daughter    Allergic rhinitis Neg Hx    Eczema Neg Hx    Urticaria Neg Hx    Esophageal cancer Neg Hx    Prostate cancer Neg Hx    Rectal cancer Neg Hx      Social Connections: Moderately Isolated (11/20/2024)   Social Connection and Isolation Panel    Frequency of Communication with Friends and Family: More than three times a week    Frequency of Social Gatherings with Friends and Family: Twice a week    Attends Religious Services: 1 to 4 times per year    Active Member of [employer] or Organizations: No    Attends Engineer, Structural: Never    Marital Status: Divorced     Current Medications[1]   Physical Exam:   BP 127/81   Pulse 88   Temp 98.6 F (37 C)   SpO2 98%   Pertinent Findings  CN II-XII grossly intact Bilateral EAC clear and TM intact with well pneumatized middle ear spaces  Anterior rhinoscopy:  Septum midline; bilateral inferior turbinates with no hypertrophy No lesions of oral cavity/oropharynx; dentition wnl No obviously palpable neck masses/lymphadenopathy/thyromegaly No respiratory distress or stridor Pain with palpation of posterior and lateral cervical musculature, this reproduces her pain.  Also left lateral flexion of the neck against resistance reproduces the pain. Minimal discomfort with palpation of bilateral TMJ  Seprately Identifiable Procedures:  None  Impression & Plans:  Minsa Weddington is a 63  y.o. female with the following   Assessment and Plan    Left ear pain-  Chronic left ear pain, occasional right sided pain.  This is reproduced with palpation of cervical musculature as well as movements of the neck.  She has a significant surgical history including ACDF spanning C3-C7.  I have very high suspicion that this is musculoskeletal in nature and not secondary to her ears.  Physical exam with no significant findings on the ear today, I would like audiological evaluation.  Phone call discussion with audio, she will follow-up with her surgeon for further evaluation and management.            - f/u phone call with audiological results   Thank you for allowing me the opportunity to care for your patient. Please do not hesitate to contact me should you have any other questions.  Sincerely, Chyrl Cohen PA-C [facility] Phone: [phone] Fax: [phone]  12/01/2024, 9:15 AM        [1]  Current Outpatient Medications:    AIRSUPRA  90-80 MCG/ACT AERO, SMARTSIG:2 Puff(s) Via Inhaler 4 Times Daily PRN, Disp: , Rfl:    albuterol  (PROVENTIL ) (2.5 MG/3ML) 0.083% nebulizer solution, USE 1 VIAL VIA NEBULIZER EVERY 4 HOURS AS NEEDED FOR WHEEZING OR SHORTNESS OF BREATH, Disp: 75 mL, Rfl: 3   albuterol  (VENTOLIN  HFA) 108 (90 Base) MCG/ACT inhaler, INHALE 2 PUFFS INTO THE LUNGS EVERY 6 HOURS AS NEEDED FOR WHEEZING OR SHORTNESS OF BREATH, Disp:  18 g, Rfl: 3   amLODipine  (NORVASC ) 5 MG tablet, Take 1 tablet (5 mg total) by mouth daily., Disp: 90 tablet, Rfl: 3   atorvastatin  (LIPITOR) 80 MG tablet, Take 1 tablet (80 mg total) by mouth daily., Disp: 90 tablet, Rfl: 1   azelastine  (OPTIVAR ) 0.05 % ophthalmic solution, Place 1 drop into both eyes 2 (two) times daily., Disp: 6 mL, Rfl: 12   BREZTRI  AEROSPHERE 160-9-4.8 MCG/ACT AERO inhaler, INHALE 2 PUFFS INTO THE LUNGS IN THE MORNING AND AT BEDTIME, Disp: 10.7 g, Rfl: 11   budesonide  (PULMICORT ) 0.5 MG/2ML nebulizer solution, USE 2 ML(0.5 MG) VIA NEBULIZER IN THE MORNING AND AT BEDTIME, Disp: 120 mL, Rfl: 2   carbamazepine  (TEGRETOL ) 200 MG tablet, Take 2-3 tablets (400-600 mg total) by mouth See admin instructions. Take 400mg  in the morning and 600mg  at bedtime., Disp: 150 tablet, Rfl: 2   cetirizine  (ZYRTEC ) 10 MG tablet, Take 10 mg by mouth daily., Disp: , Rfl:    cromolyn  (OPTICROM ) 4 % ophthalmic solution, Place 1 drop into both eyes 4 (four) times daily., Disp: 10 mL, Rfl: 12   doxepin  (SINEQUAN ) 25 MG capsule, TAKE 1 CAPSULE(25 MG) BY MOUTH AT BEDTIME AS NEEDED, Disp: 30 capsule, Rfl: 5   Ensifentrine  (OHTUVAYRE ) 3 MG/2.5ML SUSP, Inhale 3 mg into the lungs in the morning and at bedtime., Disp: 150 mL, Rfl: 11   estradiol  (ESTRACE ) 0.01 % CREA vaginal cream, PLACE 0.5 GRAMS VAGINALLY NIGHTLY FOR 2 WEEKS, THEN TWICE A WEEK AFTER, Disp: 42.5 g, Rfl: 2   famotidine  (PEPCID ) 40 MG tablet, Take 1 tablet (40 mg total) by mouth 2 (two) times daily., Disp: 60 tablet, Rfl: 5   ferrous sulfate  325 (65 FE) MG EC tablet, Take 1 tablet (325 mg total) by mouth daily with breakfast., Disp: 30 tablet, Rfl: 1   fluticasone  (FLONASE ) 50 MCG/ACT nasal spray, SHAKE LIQUID AND USE 1 SPRAY IN EACH NOSTRIL TWICE DAILY AS NEEDED FOR ALLERGIES OR RHINITIS, Disp: 16 g, Rfl: 5  formoterol  (PERFOROMIST ) 20 MCG/2ML nebulizer solution, Take 20 mcg by nebulization 2 (two) times daily., Disp: , Rfl:    furosemide   (LASIX ) 20 MG tablet, TAKE 1 TABLET BY MOUTH DAILY AS NEEDED, Disp: 30 tablet, Rfl: 5   hydrOXYzine  (VISTARIL ) 25 MG capsule, Take 1 capsule (25 mg total) by mouth at bedtime as needed for itching., Disp: , Rfl:    ipratropium (ATROVENT  HFA) 17 MCG/ACT inhaler, Inhale 2 puffs into the lungs every 4 (four) hours as needed for wheezing., Disp: 1 each, Rfl: 5   irbesartan  (AVAPRO ) 150 MG tablet, Take 1 tablet (150 mg total) by mouth daily., Disp: 90 tablet, Rfl: 3   levocetirizine (XYZAL ) 5 MG tablet, Take 1 tablet (5 mg total) by mouth every evening., Disp: , Rfl:    lidocaine  (XYLOCAINE ) 5 % ointment, Apply 1 Application topically as needed., Disp: 35.44 g, Rfl: 0   meclizine  (ANTIVERT ) 12.5 MG tablet, Take 12.5 mg by mouth 3 (three) times daily as needed for dizziness., Disp: , Rfl:    methocarbamol  (ROBAXIN ) 500 MG tablet, Take 1 tablet (500 mg total) by mouth 2 (two) times daily., Disp: 20 tablet, Rfl: 0   montelukast  (SINGULAIR ) 10 MG tablet, TAKE 1 TABLET(10 MG) BY MOUTH AT BEDTIME, Disp: 90 tablet, Rfl: 1   naproxen  (NAPROSYN ) 500 MG tablet, Take 1 tablet (500 mg total) by mouth 2 (two) times daily., Disp: 30 tablet, Rfl: 0   omeprazole  (PRILOSEC) 40 MG capsule, Take 1 capsule (40 mg total) by mouth daily., Disp: 90 capsule, Rfl: 3   ondansetron  (ZOFRAN -ODT) 4 MG disintegrating tablet, DISSOLVE 1 TABLET(4 MG) ON THE TONGUE EVERY 8 HOURS AS NEEDED FOR NAUSEA OR VOMITING, Disp: 30 tablet, Rfl: 1   pantoprazole  (PROTONIX ) 40 MG tablet, Take 40 mg by mouth daily., Disp: , Rfl:    predniSONE  (DELTASONE ) 10 MG tablet, 3 tabs by mouth per day for 3 days,2tabs per day for 3 days,1tab per day for 3 days, Disp: 18 tablet, Rfl: 0   Suvorexant  (BELSOMRA ) 15 MG TABS, Take by mouth., Disp: , Rfl:    tacrolimus  (PROTOPIC ) 0.03 % ointment, Apply 1 Application topically 2 (two) times daily as needed (rash)., Disp: 60 g, Rfl: 5   triamcinolone  ointment (KENALOG ) 0.1 %, Apply 1 Application topically at bedtime.,  Disp: 30 g, Rfl: 5   Vibegron  (GEMTESA ) 75 MG TABS, Take 1 tablet (75 mg total) by mouth daily., Disp: 30 tablet, Rfl: 11  Current Facility-Administered Medications:    benralizumab  (FASENRA ) prefilled syringe 30 mg, 30 mg, Subcutaneous, Q8 Weeks, Gallagher, Joel Louis, MD, 30 mg at 10/28/24 1420

## 2024-12-01 NOTE — Progress Notes (Signed)
°  [address], [address] [place], [place] [address] [phone]  Audiological Evaluation    Name: [name]     DOB:   [birth year]      MRN:   [id number]                                                                                     Service Date: 12/01/2024     Accompanied by: self    Patient comes today after Reyes Cohen, PA-C sent a referral for a hearing evaluation due to concerns with ear pain.   Symptoms Yes Details  Hearing loss  []    Tinnitus  [x]  Left ear, sometimes  Ear pain/ infections/pressure  [x]  Left ear pain/pressure  Balance problems  [x]  Floating sensation everyday that gets better when laying down and taking ibuprofen   Noise exposure history  []    Previous ear surgeries  []    Family history of hearing loss  []    Amplification  []    Other  [x]  Has had 2 neck surgeries    Otoscopy: Right ear: Clear external ear canal and notable landmarks visualized on the tympanic membrane. Left ear:  Clear external ear canal and notable landmarks visualized on the tympanic membrane.  Tympanometry: Right ear: Type A - Normal external ear canal volume with normal middle ear pressure and normal tympanic membrane compliance. Findings are consistent with normal middle ear function. Left ear: Type A - Normal external ear canal volume with normal middle ear pressure and normal tympanic membrane compliance. Findings are consistent with normal middle ear function.   Hearing Evaluation The hearing test results were completed under headphones and results are deemed to be of good reliability. Test technique:  conventional    Pure tone Audiometry: Right ear- Normal hearing from [phone] Hz.  Left ear-  Normal hearing from [phone] Hz.   Speech Audiometry: Right ear- Speech Reception Threshold (SRT) was obtained at 15 dBHL. Left ear-Speech Reception Threshold (SRT) was obtained at 15 dBHL.   Word Recognition Score Tested using NU-6 (recorded) Right  ear: 100% was obtained at a presentation level of 55 dBHL with contralateral masking which is deemed as  excellent. Left ear: 100% was obtained at a presentation level of  55 dBHL with contralateral masking which is deemed as  excellent.   Impression: There is not a significant difference in pure-tone thresholds between ears., There is not a significant difference in the word recognition score in between ears.    Recommendations: Follow up with ENT as scheduled. Return for a hearing evaluation if concerns with hearing changes arise or per MD recommendation.   Cailen Texeira MARIE LEROUX-MARTINEZ, AUD

## 2024-12-03 ENCOUNTER — Inpatient Hospital Stay: Attending: Hematology and Oncology | Admitting: Hematology and Oncology

## 2024-12-03 ENCOUNTER — Inpatient Hospital Stay

## 2024-12-03 ENCOUNTER — Other Ambulatory Visit: Payer: Self-pay

## 2024-12-03 VITALS — BP 127/79 | HR 85 | Temp 97.9°F | Resp 20 | Wt 185.2 lb

## 2024-12-03 DIAGNOSIS — D72829 Elevated white blood cell count, unspecified: Secondary | ICD-10-CM

## 2024-12-03 DIAGNOSIS — D539 Nutritional anemia, unspecified: Secondary | ICD-10-CM

## 2024-12-03 DIAGNOSIS — D500 Iron deficiency anemia secondary to blood loss (chronic): Secondary | ICD-10-CM | POA: Diagnosis present

## 2024-12-03 LAB — CMP (CANCER CENTER ONLY)
ALT: 19 U/L (ref 0–44)
AST: 21 U/L (ref 15–41)
Albumin: 4.4 g/dL (ref 3.5–5.0)
Alkaline Phosphatase: 117 U/L (ref 38–126)
Anion gap: 9 (ref 5–15)
BUN: 9 mg/dL (ref 8–23)
CO2: 28 mmol/L (ref 22–32)
Calcium: 9.5 mg/dL (ref 8.9–10.3)
Chloride: 105 mmol/L (ref 98–111)
Creatinine: 0.71 mg/dL (ref 0.44–1.00)
GFR, Estimated: >60 mL/min (ref >60)
Glucose, Bld: 83 mg/dL (ref 70–99)
Potassium: 3.9 mmol/L (ref 3.5–5.1)
Sodium: 142 mmol/L (ref 135–145)
Total Bilirubin: 0.5 mg/dL (ref 0.0–1.2)
Total Protein: 7.2 g/dL (ref 6.5–8.1)

## 2024-12-03 LAB — CBC WITH DIFFERENTIAL (CANCER CENTER ONLY)
Abs Immature Granulocytes: 0.07 K/uL (ref 0.00–0.07)
Basophils Absolute: 0 K/uL (ref 0.0–0.1)
Basophils Relative: 0 %
Eosinophils Absolute: 0 K/uL (ref 0.0–0.5)
Eosinophils Relative: 0 %
HCT: 29.2 % — ABNORMAL LOW (ref 36.0–46.0)
Hemoglobin: 8.8 g/dL — ABNORMAL LOW (ref 12.0–15.0)
Immature Granulocytes: 0 %
Lymphocytes Relative: 10 %
Lymphs Abs: 1.7 K/uL (ref 0.7–4.0)
MCH: 25.2 pg — ABNORMAL LOW (ref 26.0–34.0)
MCHC: 30.1 g/dL (ref 30.0–36.0)
MCV: 83.7 fL (ref 80.0–100.0)
Monocytes Absolute: 0.9 K/uL (ref 0.1–1.0)
Monocytes Relative: 5 %
Neutro Abs: 14.2 K/uL — ABNORMAL HIGH (ref 1.7–7.7)
Neutrophils Relative %: 85 %
Platelet Count: 430 K/uL — ABNORMAL HIGH (ref 150–400)
RBC: 3.49 MIL/uL — ABNORMAL LOW (ref 3.87–5.11)
RDW: 20 % — ABNORMAL HIGH (ref 11.5–15.5)
WBC Count: 16.8 K/uL — ABNORMAL HIGH (ref 4.0–10.5)
nRBC: 0 % (ref 0.0–0.2)

## 2024-12-03 LAB — RETIC PANEL
Immature Retic Fract: 29.7 % — ABNORMAL HIGH (ref 2.3–15.9)
RBC.: 3.51 MIL/uL — ABNORMAL LOW (ref 3.87–5.11)
Retic Count, Absolute: 92.7 K/uL (ref 19.0–186.0)
Retic Ct Pct: 2.6 % (ref 0.4–3.1)
Reticulocyte Hemoglobin: 24.1 pg — ABNORMAL LOW (ref >27.9)

## 2024-12-03 LAB — C-REACTIVE PROTEIN: CRP: <3 mg/dL — ABNORMAL HIGH (ref <1.0)

## 2024-12-03 LAB — SEDIMENTATION RATE: Sed Rate: 25 mm/h — ABNORMAL HIGH (ref 0–22)

## 2024-12-03 LAB — LACTATE DEHYDROGENASE: LDH: 228 U/L (ref 105–235)
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
## 2024-12-04 ENCOUNTER — Telehealth: Payer: Self-pay | Admitting: Physician Assistant

## 2024-12-04 ENCOUNTER — Ambulatory Visit

## 2024-12-04 ENCOUNTER — Telehealth: Payer: Self-pay

## 2024-12-04 ENCOUNTER — Encounter (HOSPITAL_COMMUNITY)
Admission: RE | Admit: 2024-12-04 | Discharge: 2024-12-04 | Disposition: A | Discharge status: Home / Self Care | Source: Ambulatory Visit | Attending: Pulmonary Disease | Admitting: Pulmonary Disease

## 2024-12-04 VITALS — BP 120/78 | HR 71 | Temp 98.5°F | Resp 16

## 2024-12-04 DIAGNOSIS — D500 Iron deficiency anemia secondary to blood loss (chronic): Secondary | ICD-10-CM | POA: Diagnosis present

## 2024-12-04 DIAGNOSIS — K922 Gastrointestinal hemorrhage, unspecified: Secondary | ICD-10-CM | POA: Diagnosis present

## 2024-12-04 MED ORDER — SODIUM CHLORIDE 0.9 % IV SOLN
510.0000 mg | Freq: Once | INTRAVENOUS | Status: AC
  Administered 2024-12-04: 510 mg via INTRAVENOUS
  Filled 2024-12-04: qty 510

## 2024-12-04 NOTE — Telephone Encounter (Signed)
 I spoke with patient and she is aware of 03/05/2025 appointment for lab and PA per 12/03/2024 los note from MD.

## 2024-12-04 NOTE — Telephone Encounter (Signed)
 [facility] Clinical Social Work  Clinical Social Work was referred by engineer, civil (consulting) for [employer] [facility] needs.  Clinical Social Worker contacted patient by phone to offer support and assess for needs.  Patient confirmed challenges with food and mood. Patient reported upcoming appointment with psychiatric provider. CSW provided education on [employer].  Lizbeth Sprague, LCSW  Clinical Social Worker [facility]

## 2024-12-07 ENCOUNTER — Telehealth: Payer: Self-pay | Admitting: Gastroenterology

## 2024-12-07 ENCOUNTER — Telehealth: Payer: Self-pay

## 2024-12-07 NOTE — Telephone Encounter (Signed)
 CMN received from [employer] regarding pt's cushion and humidifier. This has already been signed and faxed. NFN

## 2024-12-07 NOTE — Telephone Encounter (Addendum)
 Procedure:Enteroscopy Procedure date: 12/14/24 Procedure location: [place] Arrival Time: 7:00 am Spoke with the patient Y/N: Yes Any prep concerns? NO  Has the patient obtained the prep from the pharmacy ? No prep needed Do you have a care partner and transportation: Yes Any additional concerns? No

## 2024-12-08 ENCOUNTER — Telehealth (INDEPENDENT_AMBULATORY_CARE_PROVIDER_SITE_OTHER): Payer: Self-pay

## 2024-12-08 NOTE — Telephone Encounter (Signed)
 Called patient regarding hearing test results. Left voicemail for patient to give us  a call back for the results.

## 2024-12-08 NOTE — Telephone Encounter (Signed)
 Patient called back regarding results. Spoke with patient regarding results. Patient understood.

## 2024-12-11 ENCOUNTER — Ambulatory Visit (HOSPITAL_COMMUNITY)
Admission: RE | Admit: 2024-12-11 | Discharge: 2024-12-11 | Disposition: A | Discharge status: Home / Self Care | Source: Ambulatory Visit | Attending: Gastroenterology | Admitting: Gastroenterology

## 2024-12-11 VITALS — BP 124/83 | HR 62 | Temp 98.5°F | Resp 16

## 2024-12-11 DIAGNOSIS — K922 Gastrointestinal hemorrhage, unspecified: Secondary | ICD-10-CM | POA: Insufficient documentation

## 2024-12-11 DIAGNOSIS — D500 Iron deficiency anemia secondary to blood loss (chronic): Secondary | ICD-10-CM | POA: Insufficient documentation

## 2024-12-11 MED ORDER — SODIUM CHLORIDE 0.9 % IV SOLN
510.0000 mg | Freq: Once | INTRAVENOUS | Status: AC
  Administered 2024-12-11: 510 mg via INTRAVENOUS
  Filled 2024-12-11: qty 510

## 2024-12-14 ENCOUNTER — Ambulatory Visit (HOSPITAL_COMMUNITY): Admitting: Certified Registered"

## 2024-12-14 ENCOUNTER — Encounter (HOSPITAL_COMMUNITY): Payer: Self-pay | Admitting: Gastroenterology

## 2024-12-14 ENCOUNTER — Ambulatory Visit (HOSPITAL_COMMUNITY)
Admission: RE | Admit: 2024-12-14 | Discharge: 2024-12-14 | Disposition: A | Discharge status: Home / Self Care | Attending: Gastroenterology | Admitting: Gastroenterology

## 2024-12-14 ENCOUNTER — Other Ambulatory Visit: Payer: Self-pay

## 2024-12-14 ENCOUNTER — Encounter (HOSPITAL_COMMUNITY)
Admission: RE | Disposition: A | Discharge status: Home / Self Care | Payer: Self-pay | Source: Home / Self Care | Attending: Gastroenterology

## 2024-12-14 DIAGNOSIS — I2510 Atherosclerotic heart disease of native coronary artery without angina pectoris: Secondary | ICD-10-CM | POA: Insufficient documentation

## 2024-12-14 DIAGNOSIS — F419 Anxiety disorder, unspecified: Secondary | ICD-10-CM | POA: Diagnosis not present

## 2024-12-14 DIAGNOSIS — G4730 Sleep apnea, unspecified: Secondary | ICD-10-CM | POA: Insufficient documentation

## 2024-12-14 DIAGNOSIS — Z87891 Personal history of nicotine dependence: Secondary | ICD-10-CM

## 2024-12-14 DIAGNOSIS — R519 Headache, unspecified: Secondary | ICD-10-CM | POA: Diagnosis not present

## 2024-12-14 DIAGNOSIS — M797 Fibromyalgia: Secondary | ICD-10-CM | POA: Insufficient documentation

## 2024-12-14 DIAGNOSIS — K5520 Angiodysplasia of colon without hemorrhage: Secondary | ICD-10-CM

## 2024-12-14 DIAGNOSIS — D500 Iron deficiency anemia secondary to blood loss (chronic): Secondary | ICD-10-CM

## 2024-12-14 DIAGNOSIS — K558 Other vascular disorders of intestine: Secondary | ICD-10-CM | POA: Diagnosis not present

## 2024-12-14 DIAGNOSIS — J439 Emphysema, unspecified: Secondary | ICD-10-CM | POA: Insufficient documentation

## 2024-12-14 DIAGNOSIS — K31819 Angiodysplasia of stomach and duodenum without bleeding: Secondary | ICD-10-CM

## 2024-12-14 DIAGNOSIS — I10 Essential (primary) hypertension: Secondary | ICD-10-CM | POA: Diagnosis not present

## 2024-12-14 DIAGNOSIS — M1990 Unspecified osteoarthritis, unspecified site: Secondary | ICD-10-CM | POA: Insufficient documentation

## 2024-12-14 DIAGNOSIS — Z8673 Personal history of transient ischemic attack (TIA), and cerebral infarction without residual deficits: Secondary | ICD-10-CM | POA: Insufficient documentation

## 2024-12-14 DIAGNOSIS — I739 Peripheral vascular disease, unspecified: Secondary | ICD-10-CM | POA: Diagnosis not present

## 2024-12-14 DIAGNOSIS — D649 Anemia, unspecified: Secondary | ICD-10-CM | POA: Insufficient documentation

## 2024-12-14 DIAGNOSIS — F319 Bipolar disorder, unspecified: Secondary | ICD-10-CM | POA: Insufficient documentation

## 2024-12-14 DIAGNOSIS — M810 Age-related osteoporosis without current pathological fracture: Secondary | ICD-10-CM | POA: Insufficient documentation

## 2024-12-14 DIAGNOSIS — R569 Unspecified convulsions: Secondary | ICD-10-CM | POA: Insufficient documentation

## 2024-12-14 DIAGNOSIS — K219 Gastro-esophageal reflux disease without esophagitis: Secondary | ICD-10-CM | POA: Insufficient documentation

## 2024-12-14 HISTORY — PX: SUBMUCOSAL INJECTION: SHX5543

## 2024-12-14 HISTORY — PX: ENTEROSCOPY: SHX5533

## 2024-12-14 HISTORY — PX: HOT HEMOSTASIS: SHX5433

## 2024-12-14 HISTORY — DX: Prediabetes: R73.03

## 2024-12-14 LAB — POCT I-STAT, CHEM 8
BUN: 8 mg/dL (ref 8–23)
Calcium, Ion: 1.23 mmol/L (ref 1.15–1.40)
Chloride: 101 mmol/L (ref 98–111)
Creatinine, Ser: 0.7 mg/dL (ref 0.44–1.00)
Glucose, Bld: 104 mg/dL — ABNORMAL HIGH (ref 70–99)
HCT: 32 % — ABNORMAL LOW (ref 36.0–46.0)
Hemoglobin: 10.9 g/dL — ABNORMAL LOW (ref 12.0–15.0)
Potassium: 4 mmol/L (ref 3.5–5.1)
Sodium: 141 mmol/L (ref 135–145)
TCO2: 26 mmol/L (ref 22–32)

## 2024-12-14 SURGERY — ENTEROSCOPY
Anesthesia: Monitor Anesthesia Care

## 2024-12-14 MED ORDER — ONDANSETRON HCL 4 MG/2ML IJ SOLN
4.0000 mg | Freq: Once | INTRAMUSCULAR | Status: AC
  Administered 2024-12-14: 4 mg via INTRAVENOUS

## 2024-12-14 MED ORDER — IPRATROPIUM-ALBUTEROL 0.5-2.5 (3) MG/3ML IN SOLN
3.0000 mL | Freq: Once | RESPIRATORY_TRACT | Status: AC
  Administered 2024-12-14: 3 mL via RESPIRATORY_TRACT

## 2024-12-14 MED ORDER — IPRATROPIUM-ALBUTEROL 0.5-2.5 (3) MG/3ML IN SOLN
RESPIRATORY_TRACT | Status: AC
  Filled 2024-12-14: qty 3

## 2024-12-14 MED ORDER — GLUCAGON HCL RDNA (DIAGNOSTIC) 1 MG IJ SOLR
INTRAMUSCULAR | Status: DC | PRN
  Administered 2024-12-14: .5 mg via INTRAVENOUS

## 2024-12-14 MED ORDER — SPOT INK MARKER SYRINGE KIT
PACK | SUBMUCOSAL | Status: DC | PRN
  Administered 2024-12-14: 1 mL via SUBMUCOSAL

## 2024-12-14 MED ORDER — PROPOFOL 500 MG/50ML IV EMUL
INTRAVENOUS | Status: DC | PRN
  Administered 2024-12-14: 20 mg via INTRAVENOUS
  Administered 2024-12-14: 40 mg via INTRAVENOUS
  Administered 2024-12-14: 125 ug/kg/min via INTRAVENOUS
  Administered 2024-12-14: 20 mg via INTRAVENOUS

## 2024-12-14 MED ORDER — ONDANSETRON HCL 4 MG/2ML IJ SOLN
INTRAMUSCULAR | Status: AC
  Filled 2024-12-14: qty 2

## 2024-12-14 MED ORDER — SODIUM CHLORIDE 0.9 % IV SOLN: INTRAVENOUS | Status: DC

## 2024-12-14 NOTE — Interval H&P Note (Signed)
 History and Physical Interval Note:  12/14/2024 8:38 AM  [name]  has presented today for surgery, with the diagnosis of Iron  deficiency anemia due to chronic blood loss [D50.0].  The various methods of treatment have been discussed with the patient and family. After consideration of risks, benefits and other options for treatment, the patient has consented to  Procedures: ENTEROSCOPY (N/A) as a surgical intervention.  The patient's history has been reviewed, patient examined, no change in status, stable for surgery.  I have reviewed the patient's chart and labs.  Questions were answered to the patient's satisfaction.    Hgb 10.6 on iSTAT   Victory LITTIE Brand III

## 2024-12-14 NOTE — Anesthesia Preprocedure Evaluation (Addendum)
"                                    Anesthesia Evaluation  Patient identified by MRN, date of birth, ID band Patient awake    Reviewed: Allergy  & Precautions, NPO status , Patient's Chart, lab work & pertinent test results  Airway Mallampati: III  TM Distance: >3 FB Neck ROM: Full    Dental  (+) Dental Advisory Given, Missing,    Pulmonary asthma , sleep apnea , COPD,  COPD inhaler, former smoker   Pulmonary exam normal breath sounds clear to auscultation       Cardiovascular hypertension, Pt. on medications + CAD and + Peripheral Vascular Disease  Normal cardiovascular exam Rhythm:Regular Rate:Normal  TTE 2025  1. Left ventricular ejection fraction, by estimation, is 55 to 60%. The  left ventricle has normal function. The left ventricle has no regional  wall motion abnormalities. Left ventricular diastolic parameters were  normal.   2. Right ventricular systolic function is normal. The right ventricular  size is normal.   3. The mitral valve is normal in structure. No evidence of mitral valve  regurgitation.   4. The aortic valve has an indeterminant number of cusps. There is mild  calcification of the aortic valve. Aortic valve regurgitation is not  visualized.   5. The inferior vena cava is normal in size with greater than 50%  respiratory variability, suggesting right atrial pressure of 3 mmHg.     Neuro/Psych  Headaches, Seizures -, Well Controlled,  PSYCHIATRIC DISORDERS Anxiety Depression Bipolar Disorder   CVA, No Residual Symptoms    GI/Hepatic Neg liver ROS,GERD  ,,  Endo/Other  negative endocrine ROS    Renal/GU negative Renal ROS  negative genitourinary   Musculoskeletal  (+) Arthritis ,  Fibromyalgia -  Abdominal   Peds  Hematology  (+) Blood dyscrasia, anemia   Anesthesia Other Findings   Reproductive/Obstetrics                              Anesthesia Physical Anesthesia Plan  ASA: 3  Anesthesia Plan:  MAC   Post-op Pain Management:    Induction: Intravenous  PONV Risk Score and Plan: Propofol  infusion and Treatment may vary due to age or medical condition  Airway Management Planned: Natural Airway  Additional Equipment:   Intra-op Plan:   Post-operative Plan:   Informed Consent: I have reviewed the patients History and Physical, chart, labs and discussed the procedure including the risks, benefits and alternatives for the proposed anesthesia with the patient or authorized representative who has indicated his/her understanding and acceptance.     Dental advisory given  Plan Discussed with: CRNA  Anesthesia Plan Comments:         Anesthesia Quick Evaluation  "

## 2024-12-14 NOTE — Op Note (Signed)
 [facility] Patient Name: [name] Procedure Date : 12/14/2024 MRN: [id number] Attending MD: Victory CROME. [name] , MD, [id number] Date of Birth: [birth year] CSN: [id number] Age: 63 Admit Type: Outpatient Procedure:                Small bowel enteroscopy Indications:              Iron  deficiency anemia secondary to chronic blood                            loss, For therapy of angioectasia (suspected)                           bleeding source in prox jejunum on recent VCE                           clinical details in 11/26/24 office note                           Hgb 10.6 on iSTAT today. Rec'd feraheme x 2 in last                            10 days Providers:                Victory L. Legrand, MD, Ozell Pouch, [facility]                            [place], Technician, Medford Rogge,CRNA Referring MD:             Lynwood MICAEL Rush, MD Medicines:                Monitored Anesthesia Care Complications:            No immediate complications. Estimated Blood Loss:     Estimated blood loss: none. Procedure:                Pre-Anesthesia Assessment:                           - Prior to the procedure, a History and Physical                            was performed, and patient medications and                            allergies were reviewed. The patient's tolerance of                            previous anesthesia was also reviewed. The risks                            and benefits of the procedure and the sedation                            options and risks were discussed with the patient.  All questions were answered, and informed consent                            was obtained. Prior Anticoagulants: The patient has                            taken no anticoagulant or antiplatelet agents. ASA                            Grade Assessment: III - A patient with severe                            systemic disease. After reviewing the risks and                             benefits, the patient was deemed in satisfactory                            condition to undergo the procedure.                           After obtaining informed consent, the endoscope was                            passed under direct vision. Throughout the                            procedure, the patient's blood pressure, pulse, and                            oxygen  saturations were monitored continuously. The                            PCF-HQ190L ([id number]) Olympus colonoscope was                            introduced through the mouth and advanced to the                            proximal jejunum. The small bowel enteroscopy was                            accomplished without difficulty. The patient                            tolerated the procedure well. Scope In: Scope Out: Findings:      The esophagus was normal.      The stomach was normal.      The examined duodenum was normal.      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       Coagulation for hemostasis using argon plasma was successful (right       colon settings). Area of maximal scope insertion (about 10-15cm distal       to the more distal lesion) was  tattooed with an injection of 1 mL of       Spot (carbon black). Impression:               - Normal esophagus.                           - Normal stomach.                           - Normal examined duodenum.                           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                            Treated with argon plasma coagulation (APC).                            Tattooed. Likely source(s) for bleeding seen on                            recent capsule study                           - No specimens collected. Recommendation:           - Patient has a contact number available for                            emergencies. The signs and symptoms of potential                            delayed complications were  discussed with the                            patient. Return to normal activities tomorrow.                            Written discharge instructions were provided to the                            patient.                           - Resume previous diet.                           - CBC and iron  studies in 3-4 weeks (will be                            arranged through clinic lab) Procedure Code(s):        --- Professional ---                           [phone], Small intestinal endoscopy, enteroscopy  beyond second portion of duodenum, not including                            ileum; with control of bleeding (eg, injection,                            bipolar cautery, unipolar cautery, laser, heater                            probe, stapler, plasma coagulator)                           44799, Unlisted procedure, small intestine Diagnosis Code(s):        --- Professional ---                           K55.20, Angiodysplasia of colon without hemorrhage                           D50.0, Iron  deficiency anemia secondary to blood                            loss (chronic) CPT copyright 2022 [employer]. All rights reserved. The codes documented in this report are preliminary and upon coder review may  be revised to meet current compliance requirements. Felicia Gonyer L. Legrand, MD 12/14/2024 9:14:44 AM This report has been signed electronically. Number of Addenda: 0

## 2024-12-14 NOTE — Discharge Instructions (Signed)
 YOU HAD AN ENDOSCOPIC PROCEDURE TODAY: Refer to the procedure report and other information in the discharge instructions given to you for any specific questions about what was found during the examination. If this information does not answer your questions, please call [place] office at [phone] to clarify.   YOU SHOULD EXPECT: Some feelings of bloating in the abdomen. Passage of more gas than usual. Walking can help get rid of the air that was put into your GI tract during the procedure and reduce the bloating. Some abdominal soreness may be present for a day or two, also.  DIET: Your first meal following the procedure should be a light meal and then it is ok to progress to your normal diet. A half-sandwich or bowl of soup is an example of a good first meal. Heavy or fried foods are harder to digest and may make you feel nauseous or bloated. Drink plenty of fluids but you should avoid alcoholic beverages for 24 hours.   ACTIVITY: Your care partner should take you home directly after the procedure. You should plan to take it easy, moving slowly for the rest of the day. You can resume normal activity the day after the procedure however YOU SHOULD NOT DRIVE, use power tools, machinery or perform tasks that involve climbing or major physical exertion for 24 hours (because of the sedation medicines used during the test).   SYMPTOMS TO REPORT IMMEDIATELY: A gastroenterologist can be reached at any hour. Please call [phone]  for any of the following symptoms:   Following upper endoscopy (EGD, EUS, ERCP, esophageal dilation) Vomiting of blood or coffee ground material  New, significant abdominal pain  New, significant chest pain or pain under the shoulder blades  Painful or persistently difficult swallowing  New shortness of breath  Black, tarry-looking or red, bloody stools  FOLLOW UP:  If any biopsies were taken you will be contacted by phone or by letter within the next 1-3 weeks. Call  [phone]  if you have not heard about the biopsies in 3 weeks.  Please also call with any specific questions about appointments or follow up tests.

## 2024-12-14 NOTE — H&P (Signed)
 History and Physical:  This patient presents for endoscopic testing for: Iron  deficiency anemia of chronic blood loss  63 year old woman here today for small bowel enteroscopy to evaluate the source of small bowel GI blood loss leading to iron  deficiency anemia.  A bleeding source was seen in the proximal small bowel on a recent small bowel video capsule study, and she had prior inpatient endoscopic procedures outlined in my 11/26/2024 office note.  Hemoglobin was 8.3 the day of that office visit and 8.8 when checked a week later at hematology visit.  She received Feraheme infusion on 12/04/2024 and 12/11/2024. An i-STAT has been drawn with result pending this morning  She denies chest pain dyspnea abdominal pain hematemesis or melena.  Patient is otherwise without complaints or active issues today.   Past Medical History: Past Medical History:  Diagnosis Date   Allergy     Anemia, iron  deficiency 12/22/2014   pt. 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      Asthma    Bipolar disorder (HCC)    with anxiety, depression   COPD (chronic obstructive pulmonary disease) (HCC)    Depression    Eczema 05/18/2022   Emphysema of lung (HCC)    Fibromyalgia 05/14/2014   GERD (gastroesophageal reflux disease)    History of kidney stones    Hyperlipidemia 04/20/2017   HYPERTENSION 05/12/2009   Qualifier: Diagnosis of  By: Primus LATHER LEODIS), [name]     Osteoporosis    Peripheral vascular disease    Pneumonia    Pre-diabetes    Prediabetes 02/23/2014   pt. denies   Seizure (HCC)    Sleep apnea    Stroke (HCC) 11/2020   Urticaria    Vitamin D  deficiency      Past Surgical History: Past Surgical History:  Procedure Laterality Date   ABDOMINAL HYSTERECTOMY     ANTERIOR CERVICAL  DECOMP/DISCECTOMY FUSION N/A 07/28/2020   Procedure: ANTERIOR CERVICAL DECOMPRESSION/DISCECTOMY FUSION. INTERBODY PROTHESIS, PLATE/SCREWS CERVICAL THREE- CERVICAL FOUR, CERVICAL FOUR- CERVICAL FIVE;  Surgeon: Mavis Purchase, MD;  Location: [facility] OR;  Service: Neurosurgery;  Laterality: N/A;   BACK SURGERY     COLONOSCOPY  12/20/2011   Procedure: COLONOSCOPY;  Surgeon: Elsie CHRISTELLA Cree, MD;  Location: [facility] ENDOSCOPY;  Service: Endoscopy;  Laterality: N/A;   COLONOSCOPY  03/05/2012   Procedure: COLONOSCOPY;  Surgeon: Elsie CHRISTELLA Cree, MD;  Location: [facility] ENDOSCOPY;  Service: Endoscopy;  Laterality: N/A;   DIAGNOSTIC LAPAROSCOPY     ESOPHAGOGASTRODUODENOSCOPY N/A 10/01/2024   Procedure: EGD (ESOPHAGOGASTRODUODENOSCOPY);  Surgeon: Nandigam, Kavitha V, MD;  Location: [facility] ENDOSCOPY;  Service: Gastroenterology;  Laterality: N/A;   ESOPHAGOGASTRODUODENOSCOPY (EGD) WITH PROPOFOL  N/A 10/31/2023   Procedure: ESOPHAGOGASTRODUODENOSCOPY (EGD) WITH PROPOFOL ;  Surgeon: Legrand Victory LITTIE DOUGLAS, MD;  Location: [facility] ENDOSCOPY;  Service: Gastroenterology;  Laterality: N/A;   HEMORRHOID SURGERY     INCISE AND DRAIN ABCESS     KIDNEY STONE SURGERY     NECK SURGERY     x 2 Dr JINNY Mavis   SPINE SURGERY  2019   TOE SURGERY     TUBAL LIGATION      Allergies: Allergies[1]  Outpatient Meds: Current Facility-Administered Medications  Medication Dose Route Frequency Provider Last Rate Last Admin   0.9 %  sodium chloride  infusion   Intravenous Continuous  Legrand Victory LITTIE DOUGLAS, MD          ___________________________________________________________________ Objective   Exam:  BP 108/65   Pulse 81   Temp (!) 97 F (36.1 C) (Temporal)   Resp 18   Ht 5' 3 (1.6 m)   Wt 84.8 kg   SpO2 94%   BMI 33.13 kg/m  Well-appearing-alert and conversational CV: regular , S1/S2 Resp: clear to auscultation bilaterally, normal RR and effort noted GI: soft, no tenderness, with active bowel sounds.   Assessment: IDA of chronic GI  blood loss, suspected AVM or small bowel ulcer   Plan: Small bowel enteroscopy  The benefits and risks of the planned procedure(s) were described in detail with the patient or (when appropriate) their health care proxy.  Risks were outlined as including, but not limited to, bleeding, infection, perforation, adverse medication reaction leading to cardiac or pulmonary decompensation, pancreatitis (if ERCP).  The limitation of incomplete mucosal visualization was also discussed.  No guarantees or warranties were given.  The patient was provided an opportunity to ask questions and all were answered. The patient agreed with the plan.   The patient is appropriate for an endoscopic procedure in the ambulatory setting.   - [name], MD         [1]  Allergies Allergen Reactions   Sulfa Antibiotics Nausea And Vomiting and Other (See Comments)   Venofer  [Iron  Sucrose] Hives

## 2024-12-14 NOTE — Transfer of Care (Signed)
 Immediate Anesthesia Transfer of Care Note  Patient: [name]  Procedure(s) Performed: ENTEROSCOPY ENTEROSCOPY, WITH ARGON PLASMA COAGULATION INJECTION, SUBMUCOSAL  Patient Location: Endoscopy Unit  Anesthesia Type:MAC  Level of Consciousness: drowsy and patient cooperative  Airway & Oxygen  Therapy: Patient Spontanous Breathing and Patient connected to nasal cannula oxygen   Post-op Assessment: Report given to RN and Post -op Vital signs reviewed and stable  Post vital signs: Reviewed and stable  Last Vitals:  Vitals Value Taken Time  BP 90/55 12/14/2024 0913 AM  Temp 37 C 12/14/2024 0913 AM  Pulse 66 12/14/2024 0913 AM  Resp 14 12/14/2024 0913 AM  SpO2 96% 12/14/2024 0913 AM    Last Pain:  Vitals:   12/14/24 0730  TempSrc: Temporal  PainSc: 0-No pain         Complications: No notable events documented.

## 2024-12-15 ENCOUNTER — Encounter: Payer: Self-pay | Admitting: Audiology

## 2024-12-15 ENCOUNTER — Encounter (HOSPITAL_COMMUNITY): Payer: Self-pay | Admitting: Gastroenterology

## 2024-12-15 NOTE — Anesthesia Postprocedure Evaluation (Signed)
"   Anesthesia Post Note  Patient: [name]  Procedure(s) Performed: ENTEROSCOPY ENTEROSCOPY, WITH ARGON PLASMA COAGULATION INJECTION, SUBMUCOSAL     Patient location during evaluation: Endoscopy Anesthesia Type: MAC Level of consciousness: awake and alert Pain management: pain level controlled Vital Signs Assessment: post-procedure vital signs reviewed and stable Respiratory status: spontaneous breathing, nonlabored ventilation, respiratory function stable and patient connected to nasal cannula oxygen  Cardiovascular status: blood pressure returned to baseline and stable Postop Assessment: no apparent nausea or vomiting Anesthetic complications: no   No notable events documented.  Last Vitals:  Vitals:   12/14/24 0950 12/14/24 1000  BP: 124/79 119/87  Pulse: (!) 53 (!) 54  Resp: 19 14  Temp:    SpO2: 100% 98%    Last Pain:  Vitals:   12/14/24 1000  TempSrc:   PainSc: 0-No pain                 Krystalyn Kubota L Ulmer Degen      "

## 2024-12-16 ENCOUNTER — Ambulatory Visit: Payer: Self-pay | Admitting: Gastroenterology

## 2024-12-16 DIAGNOSIS — D500 Iron deficiency anemia secondary to blood loss (chronic): Secondary | ICD-10-CM
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
## 2024-12-23 NOTE — Telephone Encounter (Signed)
 Patient called stating she had an appointment with [facility] today and was advised that she needs to have labs completed before the end of January due to having low blood pressure. Please advise, thank you

## 2024-12-28 ENCOUNTER — Encounter: Payer: Self-pay | Admitting: *Deleted

## 2024-12-29 ENCOUNTER — Ambulatory Visit: Payer: Self-pay | Admitting: Gastroenterology

## 2024-12-29 ENCOUNTER — Ambulatory Visit

## 2024-12-29 ENCOUNTER — Other Ambulatory Visit (INDEPENDENT_AMBULATORY_CARE_PROVIDER_SITE_OTHER)

## 2024-12-29 DIAGNOSIS — D500 Iron deficiency anemia secondary to blood loss (chronic): Secondary | ICD-10-CM | POA: Diagnosis not present

## 2024-12-29 LAB — CBC WITH DIFFERENTIAL/PLATELET
Basophils Absolute: 0 K/uL (ref 0.0–0.1)
Basophils Relative: 0.5 % (ref 0.0–3.0)
Eosinophils Absolute: 0 K/uL (ref 0.0–0.7)
Eosinophils Relative: 0 % (ref 0.0–5.0)
HCT: 31.5 % — ABNORMAL LOW (ref 36.0–46.0)
Hemoglobin: 10.3 g/dL — ABNORMAL LOW (ref 12.0–15.0)
Lymphocytes Relative: 18.3 % (ref 12.0–46.0)
Lymphs Abs: 1.8 K/uL (ref 0.7–4.0)
MCHC: 32.8 g/dL (ref 30.0–36.0)
MCV: 89 fl (ref 78.0–100.0)
Monocytes Absolute: 0.6 K/uL (ref 0.1–1.0)
Monocytes Relative: 6.1 % (ref 3.0–12.0)
Neutro Abs: 7.5 K/uL (ref 1.4–7.7)
Neutrophils Relative %: 75.1 % (ref 43.0–77.0)
Platelets: 378 K/uL (ref 150.0–400.0)
RBC: 3.53 Mil/uL — ABNORMAL LOW (ref 3.87–5.11)
RDW: 22.9 % — ABNORMAL HIGH (ref 11.5–15.5)
WBC: 10 K/uL (ref 4.0–10.5)

## 2024-12-29 LAB — IBC + FERRITIN
Ferritin: 185.4 ng/mL (ref 10.0–291.0)
Iron: 54 ug/dL (ref 42–145)
Saturation Ratios: 17.9 % — ABNORMAL LOW (ref 20.0–50.0)
TIBC: 301 ug/dL (ref 250.0–450.0)
Transferrin: 215 mg/dL (ref 212.0–360.0)

## 2024-12-30 ENCOUNTER — Other Ambulatory Visit: Payer: Self-pay | Admitting: Family

## 2025-01-04 ENCOUNTER — Ambulatory Visit (HOSPITAL_COMMUNITY)

## 2025-01-04 VITALS — BP 118/80 | HR 72 | Ht 63.0 in | Wt 182.0 lb

## 2025-01-04 DIAGNOSIS — F411 Generalized anxiety disorder: Secondary | ICD-10-CM | POA: Diagnosis not present

## 2025-01-04 DIAGNOSIS — F3132 Bipolar disorder, current episode depressed, moderate: Secondary | ICD-10-CM | POA: Diagnosis not present

## 2025-01-04 DIAGNOSIS — G4700 Insomnia, unspecified: Secondary | ICD-10-CM

## 2025-01-04 MED ORDER — CARBAMAZEPINE 200 MG PO TABS: 200.0000 mg | ORAL_TABLET | Freq: Every day | ORAL | 1 refills | Status: AC

## 2025-01-05 ENCOUNTER — Ambulatory Visit: Admitting: Internal Medicine

## 2025-01-05 NOTE — Addendum Note (Signed)
 Addended by: CARVIN CROCK on: 01/05/2025 11:19 AM   Modules accepted: Level of Service

## 2025-01-17 ENCOUNTER — Other Ambulatory Visit: Payer: Self-pay | Admitting: Allergy & Immunology

## 2025-01-17 DIAGNOSIS — J449 Chronic obstructive pulmonary disease, unspecified: Secondary | ICD-10-CM

## 2025-01-17 DIAGNOSIS — J4550 Severe persistent asthma, uncomplicated: Secondary | ICD-10-CM
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
## 2025-01-21 ENCOUNTER — Other Ambulatory Visit: Payer: Self-pay | Admitting: Allergy & Immunology

## 2025-01-26 ENCOUNTER — Ambulatory Visit

## 2025-02-01 ENCOUNTER — Ambulatory Visit (HOSPITAL_COMMUNITY)

## 2025-02-03 ENCOUNTER — Ambulatory Visit (HOSPITAL_COMMUNITY)

## 2025-02-15 ENCOUNTER — Ambulatory Visit: Admitting: Dermatology

## 2025-02-22 ENCOUNTER — Encounter: Admitting: Internal Medicine

## 2025-03-05 ENCOUNTER — Inpatient Hospital Stay: Admitting: Physician Assistant

## 2025-03-05 ENCOUNTER — Inpatient Hospital Stay

## 2025-11-22 ENCOUNTER — Ambulatory Visit
